# Patient Record
Sex: Male | Born: 1965 | Race: Black or African American | Hispanic: No | Marital: Married | Smoking: Current every day smoker
Health system: Southern US, Community
[De-identification: ages and names within clinical notes are randomized; demographics above are authoritative.]

## PROBLEM LIST (undated history)

## (undated) DIAGNOSIS — E43 Unspecified severe protein-calorie malnutrition: Secondary | ICD-10-CM

## (undated) DIAGNOSIS — F32A Depression, unspecified: Secondary | ICD-10-CM

## (undated) DIAGNOSIS — F1910 Other psychoactive substance abuse, uncomplicated: Secondary | ICD-10-CM

## (undated) DIAGNOSIS — E2740 Unspecified adrenocortical insufficiency: Secondary | ICD-10-CM

## (undated) DIAGNOSIS — A419 Sepsis, unspecified organism: Secondary | ICD-10-CM

## (undated) DIAGNOSIS — G001 Pneumococcal meningitis: Secondary | ICD-10-CM

## (undated) DIAGNOSIS — G002 Streptococcal meningitis: Secondary | ICD-10-CM

## (undated) DIAGNOSIS — I5042 Chronic combined systolic (congestive) and diastolic (congestive) heart failure: Secondary | ICD-10-CM

## (undated) DIAGNOSIS — I76 Septic arterial embolism: Secondary | ICD-10-CM

## (undated) DIAGNOSIS — I6330 Cerebral infarction due to thrombosis of unspecified cerebral artery: Secondary | ICD-10-CM

## (undated) DIAGNOSIS — I214 Non-ST elevation (NSTEMI) myocardial infarction: Secondary | ICD-10-CM

## (undated) DIAGNOSIS — G919 Hydrocephalus, unspecified: Secondary | ICD-10-CM

## (undated) DIAGNOSIS — F329 Major depressive disorder, single episode, unspecified: Secondary | ICD-10-CM

## (undated) DIAGNOSIS — I469 Cardiac arrest, cause unspecified: Secondary | ICD-10-CM

## (undated) DIAGNOSIS — A5903 Trichomonal cystitis and urethritis: Secondary | ICD-10-CM

## (undated) DIAGNOSIS — J9601 Acute respiratory failure with hypoxia: Secondary | ICD-10-CM

## (undated) DIAGNOSIS — R6520 Severe sepsis without septic shock: Secondary | ICD-10-CM

## (undated) DIAGNOSIS — R570 Cardiogenic shock: Secondary | ICD-10-CM

## (undated) DIAGNOSIS — E870 Hyperosmolality and hypernatremia: Secondary | ICD-10-CM

## (undated) DIAGNOSIS — R569 Unspecified convulsions: Secondary | ICD-10-CM

## (undated) DIAGNOSIS — J9690 Respiratory failure, unspecified, unspecified whether with hypoxia or hypercapnia: Secondary | ICD-10-CM

## (undated) DIAGNOSIS — R1310 Dysphagia, unspecified: Secondary | ICD-10-CM

## (undated) DIAGNOSIS — I639 Cerebral infarction, unspecified: Secondary | ICD-10-CM

## (undated) DIAGNOSIS — E785 Hyperlipidemia, unspecified: Secondary | ICD-10-CM

## (undated) DIAGNOSIS — G9340 Encephalopathy, unspecified: Secondary | ICD-10-CM

## (undated) DIAGNOSIS — R6521 Severe sepsis with septic shock: Secondary | ICD-10-CM

## (undated) DIAGNOSIS — F0390 Unspecified dementia without behavioral disturbance: Secondary | ICD-10-CM

## (undated) DIAGNOSIS — G0490 Encephalitis and encephalomyelitis, unspecified: Secondary | ICD-10-CM

## (undated) DIAGNOSIS — N179 Acute kidney failure, unspecified: Secondary | ICD-10-CM

## (undated) DIAGNOSIS — T68XXXA Hypothermia, initial encounter: Secondary | ICD-10-CM

## (undated) DIAGNOSIS — I509 Heart failure, unspecified: Secondary | ICD-10-CM

## (undated) DIAGNOSIS — I669 Occlusion and stenosis of unspecified cerebral artery: Secondary | ICD-10-CM

## (undated) DIAGNOSIS — R4182 Altered mental status, unspecified: Secondary | ICD-10-CM

## (undated) DIAGNOSIS — F1010 Alcohol abuse, uncomplicated: Secondary | ICD-10-CM

## (undated) DIAGNOSIS — R339 Retention of urine, unspecified: Secondary | ICD-10-CM

## (undated) DIAGNOSIS — Z720 Tobacco use: Secondary | ICD-10-CM

## (undated) HISTORY — DX: Acute respiratory failure with hypoxia: J96.01

## (undated) HISTORY — DX: Cardiogenic shock: R57.0

## (undated) HISTORY — DX: Streptococcal meningitis: G00.2

## (undated) HISTORY — DX: Hydrocephalus, unspecified: G91.9

## (undated) HISTORY — DX: Encephalitis and encephalomyelitis, unspecified: G04.90

## (undated) HISTORY — DX: Unspecified adrenocortical insufficiency: E27.40

## (undated) HISTORY — DX: Severe sepsis with septic shock: R65.21

## (undated) HISTORY — DX: Alcohol abuse, uncomplicated: F10.10

## (undated) HISTORY — DX: Respiratory failure, unspecified, unspecified whether with hypoxia or hypercapnia: J96.90

## (undated) HISTORY — DX: Dysphagia, unspecified: R13.10

## (undated) HISTORY — DX: Sepsis, unspecified organism: A41.9

## (undated) HISTORY — DX: Pneumococcal meningitis: G00.1

## (undated) HISTORY — DX: Other psychoactive substance abuse, uncomplicated: F19.10

## (undated) HISTORY — PX: ANKLE SURGERY: SHX546

## (undated) HISTORY — DX: Cerebral infarction due to thrombosis of unspecified cerebral artery: I63.30

## (undated) HISTORY — DX: Acute kidney failure, unspecified: N17.9

## (undated) HISTORY — DX: Retention of urine, unspecified: R33.9

## (undated) HISTORY — DX: Chronic combined systolic (congestive) and diastolic (congestive) heart failure: I50.42

## (undated) HISTORY — DX: Severe sepsis without septic shock: R65.20

## (undated) HISTORY — DX: Cerebral infarction, unspecified: I63.9

## (undated) HISTORY — DX: Unspecified severe protein-calorie malnutrition: E43

## (undated) HISTORY — DX: Unspecified convulsions: R56.9

## (undated) HISTORY — DX: Heart failure, unspecified: I50.9

## (undated) HISTORY — DX: Tobacco use: Z72.0

## (undated) HISTORY — DX: Hypothermia, initial encounter: T68.XXXA

## (undated) HISTORY — DX: Cardiac arrest, cause unspecified: I46.9

## (undated) HISTORY — DX: Unspecified dementia without behavioral disturbance: F03.90

## (undated) HISTORY — DX: Encephalopathy, unspecified: G93.40

## (undated) HISTORY — DX: Septic arterial embolism: I76

## (undated) HISTORY — DX: Hyperosmolality and hypernatremia: E87.0

## (undated) HISTORY — DX: Trichomonal cystitis and urethritis: A59.03

## (undated) HISTORY — DX: Hyperlipidemia, unspecified: E78.5

## (undated) HISTORY — DX: Altered mental status, unspecified: R41.82

## (undated) HISTORY — DX: Occlusion and stenosis of unspecified cerebral artery: I66.9

## (undated) HISTORY — DX: Non-ST elevation (NSTEMI) myocardial infarction: I21.4

---

## 1998-10-05 ENCOUNTER — Emergency Department (HOSPITAL_COMMUNITY): Admission: EM | Admit: 1998-10-05 | Discharge: 1998-10-05 | Payer: Self-pay | Admitting: Emergency Medicine

## 2002-09-18 ENCOUNTER — Emergency Department (HOSPITAL_COMMUNITY): Admission: EM | Admit: 2002-09-18 | Discharge: 2002-09-18 | Payer: Self-pay | Admitting: Emergency Medicine

## 2003-03-07 ENCOUNTER — Emergency Department (HOSPITAL_COMMUNITY): Admission: EM | Admit: 2003-03-07 | Discharge: 2003-03-07 | Payer: Self-pay

## 2003-04-29 ENCOUNTER — Encounter: Payer: Self-pay | Admitting: Emergency Medicine

## 2003-04-29 ENCOUNTER — Emergency Department (HOSPITAL_COMMUNITY): Admission: EM | Admit: 2003-04-29 | Discharge: 2003-04-29 | Payer: Self-pay | Admitting: Emergency Medicine

## 2005-02-25 ENCOUNTER — Emergency Department (HOSPITAL_COMMUNITY): Admission: EM | Admit: 2005-02-25 | Discharge: 2005-02-26 | Payer: Self-pay | Admitting: Emergency Medicine

## 2005-05-07 ENCOUNTER — Emergency Department (HOSPITAL_COMMUNITY): Admission: EM | Admit: 2005-05-07 | Discharge: 2005-05-07 | Payer: Self-pay | Admitting: *Deleted

## 2009-06-01 ENCOUNTER — Inpatient Hospital Stay (HOSPITAL_COMMUNITY): Admission: EM | Admit: 2009-06-01 | Discharge: 2009-06-04 | Payer: Self-pay | Admitting: Emergency Medicine
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
## 2011-03-14 LAB — CBC
MCHC: 34.2 g/dL (ref 30.0–36.0)
Platelets: 243 10*3/uL (ref 150–400)
RDW: 13.7 % (ref 11.5–15.5)

## 2011-03-14 LAB — LACTIC ACID, PLASMA: Lactic Acid, Venous: 3.1 mmol/L — ABNORMAL HIGH (ref 0.5–2.2)

## 2011-03-14 LAB — POCT I-STAT, CHEM 8
Calcium, Ion: 1.1 mmol/L — ABNORMAL LOW (ref 1.12–1.32)
Creatinine, Ser: 0.9 mg/dL (ref 0.4–1.5)
Glucose, Bld: 82 mg/dL (ref 70–99)
HCT: 41 % (ref 39.0–52.0)
Hemoglobin: 13.9 g/dL (ref 13.0–17.0)

## 2011-03-14 LAB — PROTIME-INR: Prothrombin Time: 12.7 seconds (ref 11.6–15.2)
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
## 2011-11-13 ENCOUNTER — Emergency Department (HOSPITAL_COMMUNITY): Payer: Self-pay

## 2011-11-13 ENCOUNTER — Emergency Department (HOSPITAL_COMMUNITY)
Admission: EM | Admit: 2011-11-13 | Discharge: 2011-11-13 | Disposition: A | Discharge status: Home / Self Care | Payer: Self-pay | Attending: Emergency Medicine | Admitting: Emergency Medicine

## 2011-11-13 ENCOUNTER — Encounter: Payer: Self-pay | Admitting: Emergency Medicine

## 2011-11-13 DIAGNOSIS — A5903 Trichomonal cystitis and urethritis: Secondary | ICD-10-CM | POA: Insufficient documentation

## 2011-11-13 DIAGNOSIS — R1032 Left lower quadrant pain: Secondary | ICD-10-CM | POA: Insufficient documentation

## 2011-11-13 DIAGNOSIS — F17200 Nicotine dependence, unspecified, uncomplicated: Secondary | ICD-10-CM | POA: Insufficient documentation

## 2011-11-13 DIAGNOSIS — M549 Dorsalgia, unspecified: Secondary | ICD-10-CM | POA: Insufficient documentation

## 2011-11-13 DIAGNOSIS — R509 Fever, unspecified: Secondary | ICD-10-CM | POA: Insufficient documentation

## 2011-11-13 DIAGNOSIS — R10814 Left lower quadrant abdominal tenderness: Secondary | ICD-10-CM | POA: Insufficient documentation

## 2011-11-13 DIAGNOSIS — R112 Nausea with vomiting, unspecified: Secondary | ICD-10-CM | POA: Insufficient documentation

## 2011-11-13 LAB — DIFFERENTIAL
Basophils Absolute: 0 10*3/uL (ref 0.0–0.1)
Basophils Relative: 0 % (ref 0–1)
Eosinophils Absolute: 0.1 10*3/uL (ref 0.0–0.7)
Monocytes Absolute: 0.4 10*3/uL (ref 0.1–1.0)
Neutro Abs: 4.5 10*3/uL (ref 1.7–7.7)
Neutrophils Relative %: 61 % (ref 43–77)

## 2011-11-13 LAB — URINALYSIS, ROUTINE W REFLEX MICROSCOPIC
Glucose, UA: NEGATIVE mg/dL
Ketones, ur: NEGATIVE mg/dL
Nitrite: NEGATIVE
Specific Gravity, Urine: 1.005 (ref 1.005–1.030)
pH: 6 (ref 5.0–8.0)

## 2011-11-13 LAB — COMPREHENSIVE METABOLIC PANEL
AST: 30 U/L (ref 0–37)
Albumin: 3.8 g/dL (ref 3.5–5.2)
BUN: 10 mg/dL (ref 6–23)
Chloride: 103 mEq/L (ref 96–112)
Creatinine, Ser: 0.67 mg/dL (ref 0.50–1.35)
Total Bilirubin: 0.6 mg/dL (ref 0.3–1.2)
Total Protein: 7.5 g/dL (ref 6.0–8.3)

## 2011-11-13 LAB — URINE MICROSCOPIC-ADD ON

## 2011-11-13 LAB — CBC
MCH: 31.6 pg (ref 26.0–34.0)
MCHC: 35.5 g/dL (ref 30.0–36.0)
RDW: 13.6 % (ref 11.5–15.5)

## 2011-11-13 LAB — LIPASE, BLOOD: Lipase: 24 U/L (ref 11–59)

## 2011-11-13 MED ORDER — MORPHINE SULFATE 4 MG/ML IJ SOLN
4.0000 mg | Freq: Once | INTRAMUSCULAR | Status: AC
  Administered 2011-11-13: 4 mg via INTRAVENOUS
  Filled 2011-11-13: qty 1

## 2011-11-13 MED ORDER — ONDANSETRON HCL 4 MG/2ML IJ SOLN
4.0000 mg | Freq: Once | INTRAMUSCULAR | Status: AC
  Administered 2011-11-13: 4 mg via INTRAVENOUS
  Filled 2011-11-13: qty 2

## 2011-11-13 MED ORDER — SODIUM CHLORIDE 0.9 % IV BOLUS (SEPSIS)
1000.0000 mL | Freq: Once | INTRAVENOUS | Status: AC
  Administered 2011-11-13: 1000 mL via INTRAVENOUS

## 2011-11-13 MED ORDER — METRONIDAZOLE 500 MG PO TABS: 500.0000 mg | ORAL_TABLET | Freq: Two times a day (BID) | ORAL | Status: AC

## 2011-11-13 MED ORDER — IOHEXOL 300 MG/ML  SOLN
100.0000 mL | Freq: Once | INTRAMUSCULAR | Status: AC | PRN
  Administered 2011-11-13: 100 mL via INTRAVENOUS

## 2011-11-13 NOTE — ED Notes (Signed)
C/o abd pain, nausea, and vomiting x 1 year.

## 2011-11-13 NOTE — ED Provider Notes (Signed)
History     CSN: [id number] Arrival date & time: 11/13/2011  6:10 PM   First MD Initiated Contact with Patient 11/13/11 1909      Chief Complaint  Patient presents with  . Abdominal Pain     HPI  45yo male previously healthy presents with abdominal pain. The patient complains of left lower quadrant abdominal pain intermittently over the past one year. He states the pain has been worse recently. He complains of multiple episodes of nausea and nonbilious nonbloody emesis. He denies pain radiating to his back but states that he does have chronic left lower back pain which he feels is unrelated. He denies hematuria, dysuria, frequency, urgency. He is sexually active. He denies fevers, chills. He did not take anything for pain prior to arrival. Denies other complaints today including chest pain, shortness of breath. He is not having constipation or diarrhea. No history of abdominal surgeries.   ED Notes, ED Provider Notes from 11/13/11 0000 to 11/13/11 18:18:39       [name], RN 11/13/2011 18:17      C/o abd pain, nausea, and vomiting x 1 year.     History reviewed. No pertinent past medical history.  History reviewed. No pertinent past surgical history.  No family history on file.  History  Substance Use Topics  . Smoking status: Current Everyday Smoker  . Smokeless tobacco: Not on file  . Alcohol Use: Yes      Review of Systems  All other systems reviewed and are negative.   except as noted HPI   Allergies  Review of patient's allergies indicates no known allergies.  Home Medications   Current Outpatient Rx  Name Route Sig Dispense Refill  . METRONIDAZOLE 500 MG PO TABS Oral Take 1 tablet (500 mg total) by mouth 2 (two) times daily. 14 tablet 0    BP 106/70  Pulse 66  Temp(Src) 97.7 F (36.5 C) (Oral)  Resp 14  SpO2 98%  Physical Exam  Nursing note and vitals reviewed. Constitutional: He is oriented to person, place, and time. He appears  well-developed and well-nourished. No distress.  HENT:  Head: Atraumatic.  Mouth/Throat: Oropharynx is clear and moist.  Eyes: Conjunctivae are normal. Pupils are equal, round, and reactive to light.  Neck: Neck supple.  Cardiovascular: Normal rate, regular rhythm, normal heart sounds and intact distal pulses.  Exam reveals no gallop and no friction rub.   No murmur heard. Pulmonary/Chest: Effort normal. No respiratory distress. He has no wheezes. He has no rales.  Abdominal: Soft. Bowel sounds are normal. There is no tenderness. There is no rebound and no guarding.       Diffuse abd ttp > LLQ  Musculoskeletal: Normal range of motion. He exhibits no edema and no tenderness.  Neurological: He is alert and oriented to person, place, and time.  Skin: Skin is warm and dry.  Psychiatric: He has a normal mood and affect.    ED Course  Procedures (including critical care time)  Labs Reviewed  CBC - Abnormal; Notable for the following:    HCT 38.6 (*)    All other components within normal limits  URINALYSIS, ROUTINE W REFLEX MICROSCOPIC - Abnormal; Notable for the following:    Color, Urine STRAW (*)    Leukocytes, UA SMALL (*)    All other components within normal limits  URINE MICROSCOPIC-ADD ON - Abnormal; Notable for the following:    Squamous Epithelial / LPF FEW (*)    All other  components within normal limits  DIFFERENTIAL  COMPREHENSIVE METABOLIC PANEL  LIPASE, BLOOD  GC/CHLAMYDIA PROBE AMP, URINE   Ct Abdomen Pelvis W Contrast  11/13/2011  *RADIOLOGY REPORT*  Clinical Data: Abdominal pain, fever, nausea, vomiting  CT ABDOMEN AND PELVIS WITH CONTRAST  Technique:  Multidetector CT imaging of the abdomen and pelvis was performed following the standard protocol during bolus administration of intravenous contrast.  Contrast: OMNIPAQUE IOHEXOL 300 MG/ML IV SOLN  Comparison: 06/01/2009  Findings: The lung bases are clear.  The liver, spleen, gallbladder, pancreas, adrenal glands,  kidneys, abdominal aorta, and retroperitoneal lymph nodes are unremarkable.  Mild atherosclerotic calcification in the aorta.  Contrast filled stomach without significant wall thickening.  The colon is decompressed.  Scattered stool.  Small bowel are decompressed.  No free air or free fluid in the abdomen.  Pelvis:  The bladder wall is not thickened.  Mild prominence of the prostate gland with prominent right seminal vesicle.  No free or loculated pelvic fluid collections.  No inflammatory changes suggested in the sigmoid colon.  The appendix is normal.  Normal alignment of the lumbar vertebrae.  No significant change since previous study.  Note that the tiny left lower lung nodules seen previously is not visible today.  IMPRESSION: No acute process demonstrated in the abdomen or pelvis.  Original Report Authenticated By: Marlon Pel, M.D.    1. Abdominal pain   2. Trichomonal urethritis in male     MDM  Nonspec abd pain. CT A/P unremarkable as above. +Trichomonas, add on Gc/Chl. Will treat with flagyl. Precautions for return.         Forbes Cellar, MD 11/13/11 [phone]

## 2011-11-15 LAB — GC/CHLAMYDIA PROBE AMP, URINE: Chlamydia, Swab/Urine, PCR: NEGATIVE

## 2011-12-02 ENCOUNTER — Encounter (HOSPITAL_COMMUNITY): Payer: Self-pay | Admitting: Emergency Medicine

## 2011-12-02 ENCOUNTER — Emergency Department (HOSPITAL_COMMUNITY)
Admission: EM | Admit: 2011-12-02 | Discharge: 2011-12-02 | Discharge status: Against Medical Advice | Payer: Self-pay | Attending: Emergency Medicine | Admitting: Emergency Medicine

## 2011-12-02 DIAGNOSIS — F1010 Alcohol abuse, uncomplicated: Secondary | ICD-10-CM | POA: Insufficient documentation

## 2011-12-02 DIAGNOSIS — R109 Unspecified abdominal pain: Secondary | ICD-10-CM | POA: Insufficient documentation

## 2011-12-02 NOTE — ED Notes (Signed)
Pt alert, presents via PTAR, c/o abd pain, hx pancreatitis, + ETOH, resp even unlabored, skin pwd

## 2011-12-02 NOTE — ED Notes (Signed)
Pt alert, GCS 15, pt decides to leave p/t eval by MD, pt refuses to sign AMA paperwork, pt leaves ER
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
## 2011-12-02 NOTE — ED Notes (Signed)
Pt remains on unit, wandering into hallway, requesting food, educated pt that no PO nourishments until eval by MD

## 2013-04-02 IMAGING — CT CT ABD-PELV W/ CM
3 of 5 series · 14 of 32 positions shown, 19 images · IV contrast (water/omni  & 100ml omni 300)
Comparison: 06/01/2009

CLINICAL DATA: Abdominal pain, fever, nausea, vomiting

CT ABDOMEN AND PELVIS WITH CONTRAST
TECHNIQUE: Multidetector CT imaging of the abdomen and pelvis was
performed following the standard protocol during bolus
administration of intravenous contrast.
Contrast: 100mL OMNIPAQUE IOHEXOL 300 MG/ML IV SOLN

[Series 2: routine abdomen · axial · 0.67mm/px · z∈[-379,-139]mm · 4 of 81 slices shown, 9 images]
[im 17/81  soft-tissue]
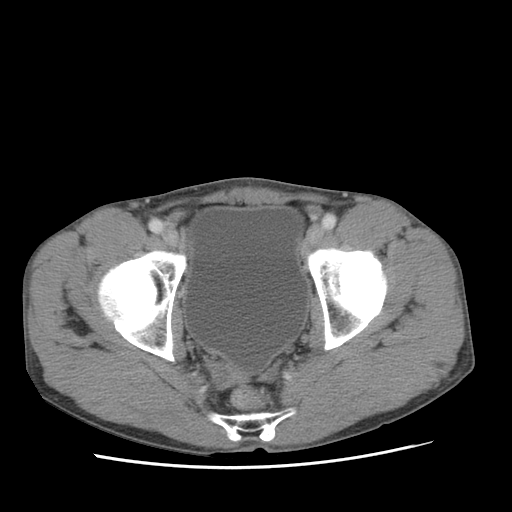
[im 17/81  lung]
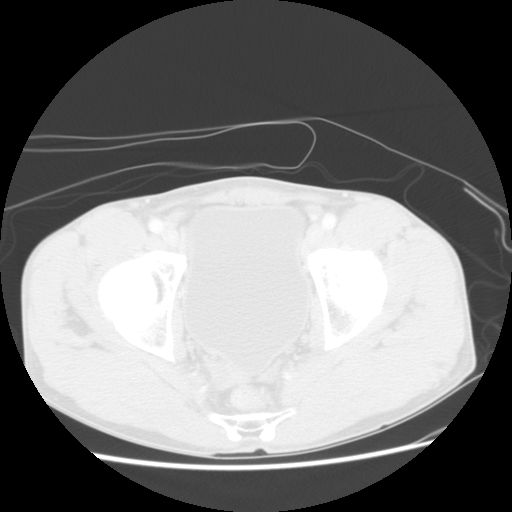
[im 17/81  bone]
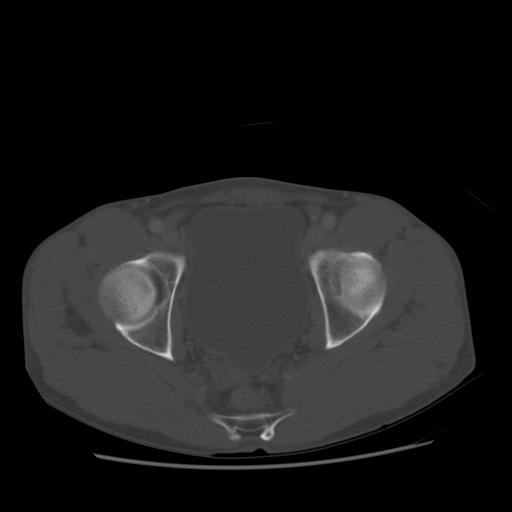
[im 33/81  soft-tissue]
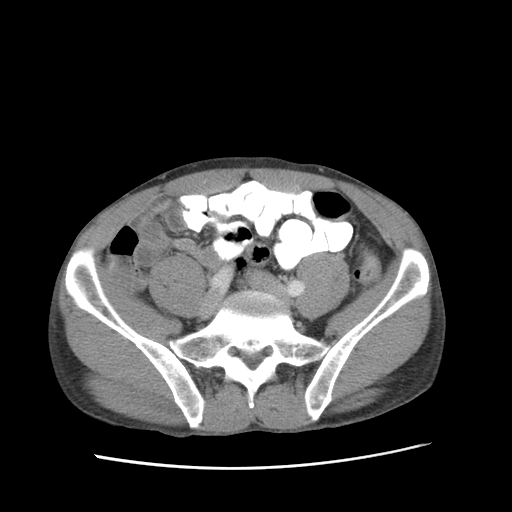
[im 33/81  lung]
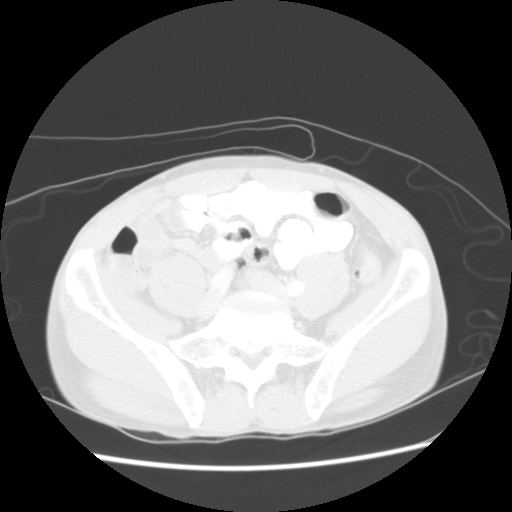
[im 49/81  soft-tissue]
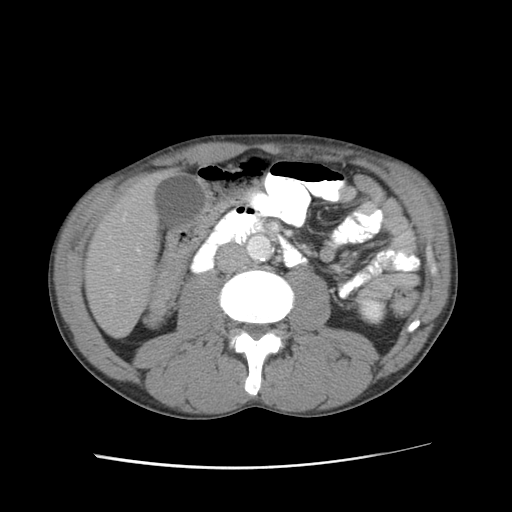
[im 49/81  lung]
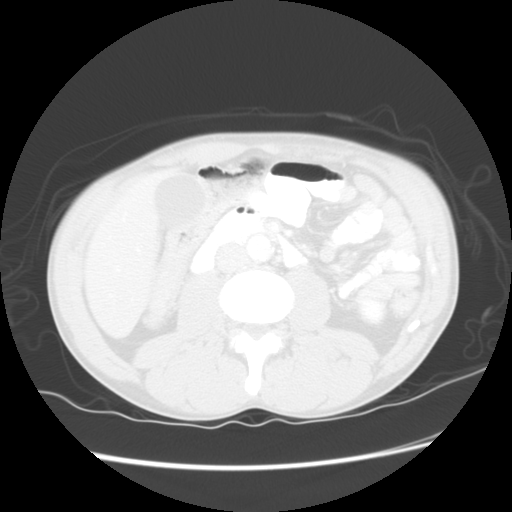
[im 65/81  soft-tissue]
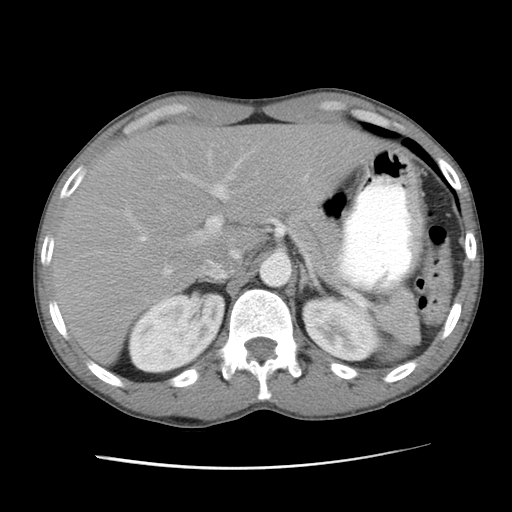
[im 65/81  lung]
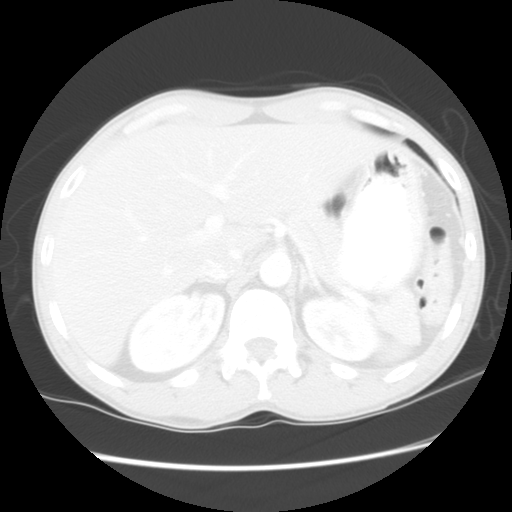

[Series 400: cor · coronal · 0.81mm/px · 2 of 107 slices shown]
[im 14/107  soft-tissue]
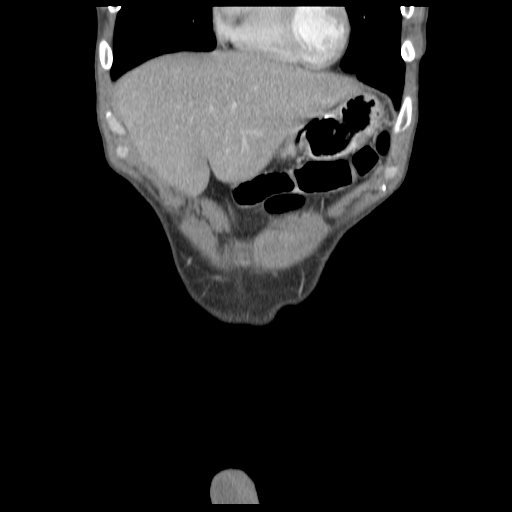
[im 27/107  soft-tissue]
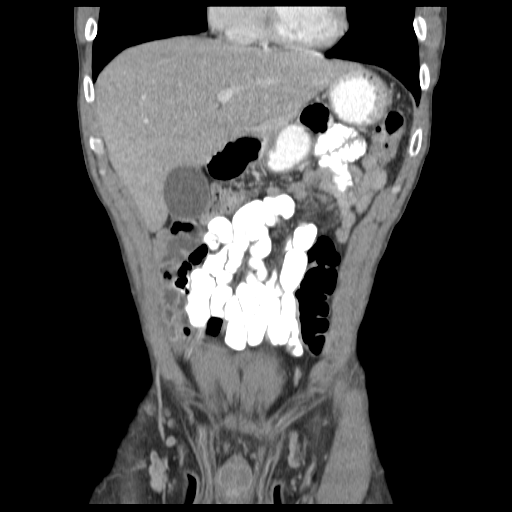

[Series 401: sag · sagittal · 0.81mm/px · 8 of 142 slices shown]
[im 13/142  soft-tissue]
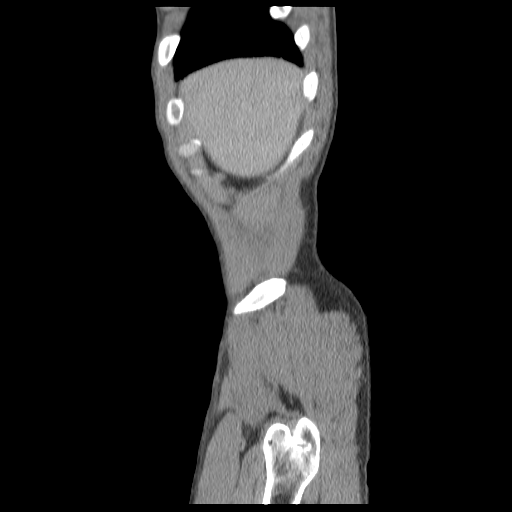
[im 26/142  soft-tissue]
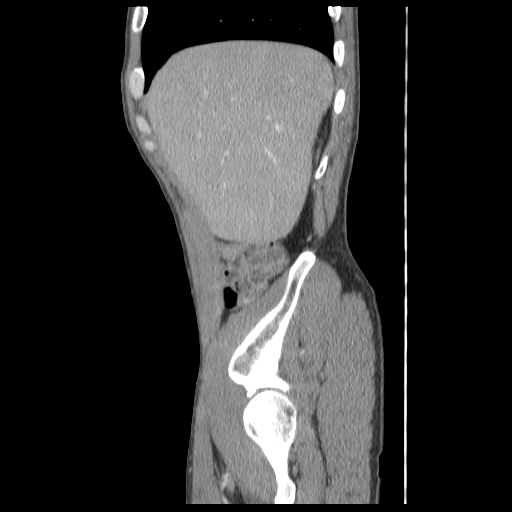
[im 52/142  soft-tissue]
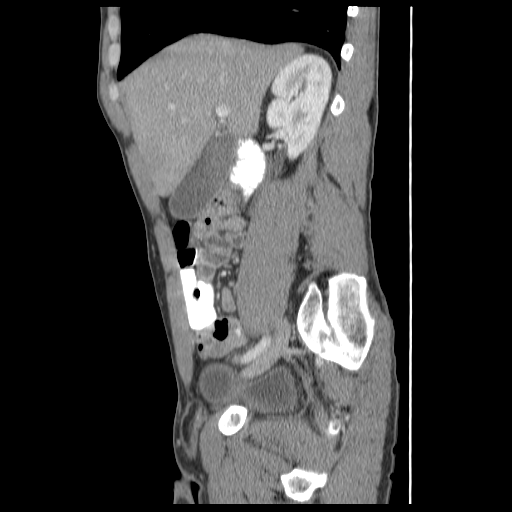
[im 65/142  soft-tissue]
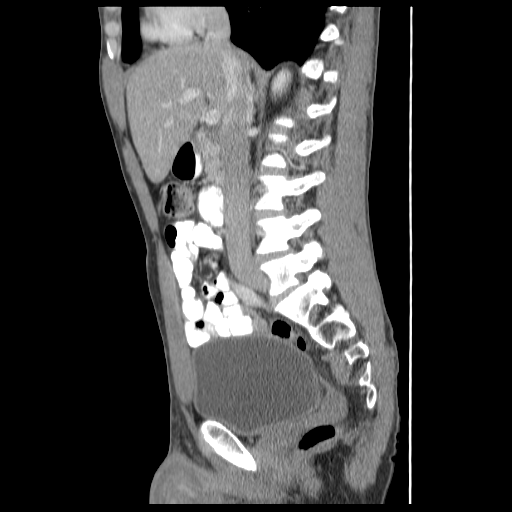
[im 77/142  soft-tissue]
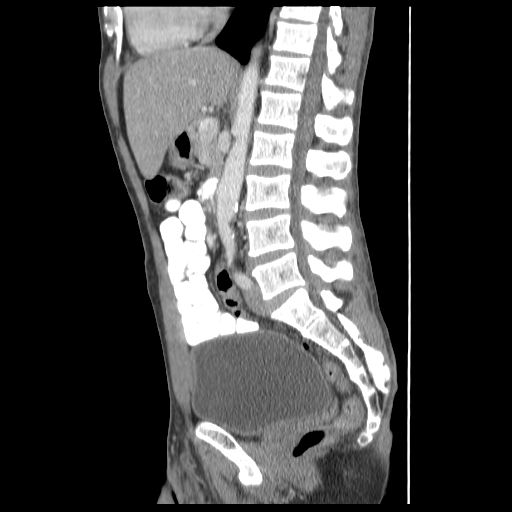
[im 90/142  soft-tissue]
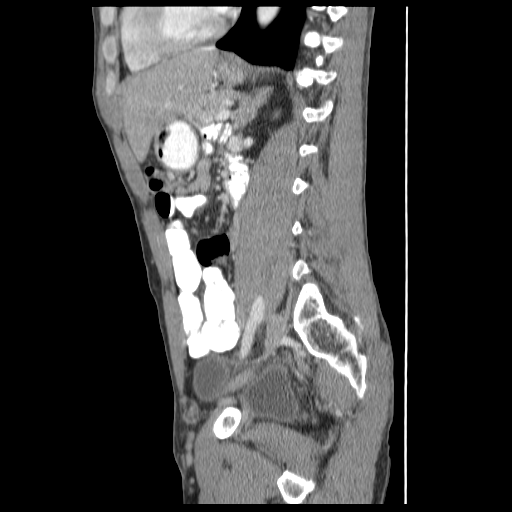
[im 116/142  soft-tissue]
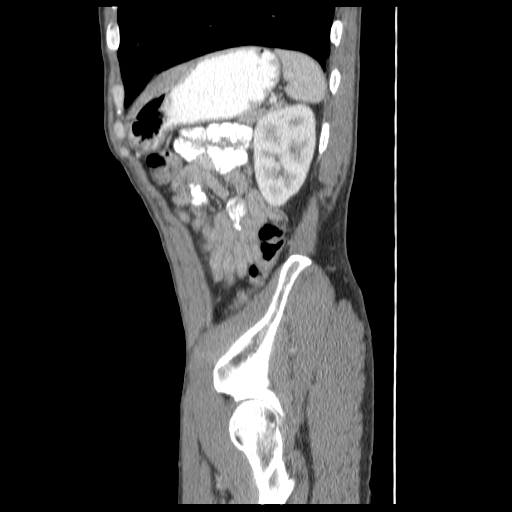
[im 129/142  soft-tissue]
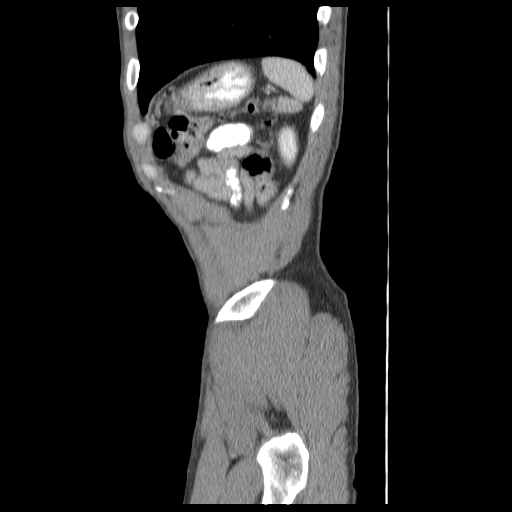

[14 of 32 positions shown; findings below may reference images not displayed]

FINDINGS: The lung bases are clear.  The liver, spleen,
gallbladder, pancreas, adrenal glands, kidneys, abdominal aorta,
and retroperitoneal lymph nodes are unremarkable.  Mild
atherosclerotic calcification in the aorta.  Contrast filled
stomach without significant wall thickening.  The colon is
decompressed.  Scattered stool.  Small bowel are decompressed.  No
free air or free fluid in the abdomen.

Pelvis:  The bladder wall is not thickened.  Mild prominence of the
prostate gland with prominent right seminal vesicle.  No free or
loculated pelvic fluid collections.  No inflammatory changes
suggested in the sigmoid colon.  The appendix is normal.  Normal
alignment of the lumbar vertebrae.

No significant change since previous study.  Note that the tiny
left lower lung nodules seen previously is not visible today.
IMPRESSION: No acute process demonstrated in the abdomen or pelvis.

## 2014-08-25 ENCOUNTER — Emergency Department (HOSPITAL_COMMUNITY): Payer: No Typology Code available for payment source

## 2014-08-25 ENCOUNTER — Encounter (HOSPITAL_COMMUNITY): Payer: Self-pay | Admitting: Emergency Medicine

## 2014-08-25 ENCOUNTER — Emergency Department (HOSPITAL_COMMUNITY)
Admission: EM | Admit: 2014-08-25 | Discharge: 2014-08-25 | Disposition: A | Discharge status: Home / Self Care | Payer: No Typology Code available for payment source | Attending: Emergency Medicine | Admitting: Emergency Medicine

## 2014-08-25 DIAGNOSIS — F17200 Nicotine dependence, unspecified, uncomplicated: Secondary | ICD-10-CM | POA: Insufficient documentation

## 2014-08-25 DIAGNOSIS — Y9389 Activity, other specified: Secondary | ICD-10-CM | POA: Insufficient documentation

## 2014-08-25 DIAGNOSIS — Y92410 Unspecified street and highway as the place of occurrence of the external cause: Secondary | ICD-10-CM | POA: Insufficient documentation

## 2014-08-25 DIAGNOSIS — S39012A Strain of muscle, fascia and tendon of lower back, initial encounter: Secondary | ICD-10-CM

## 2014-08-25 DIAGNOSIS — IMO0002 Reserved for concepts with insufficient information to code with codable children: Secondary | ICD-10-CM | POA: Insufficient documentation

## 2014-08-25 HISTORY — DX: Depression, unspecified: F32.A

## 2014-08-25 HISTORY — DX: Major depressive disorder, single episode, unspecified: F32.9

## 2014-08-25 MED ORDER — NAPROXEN 500 MG PO TABS: 500.0000 mg | ORAL_TABLET | Freq: Two times a day (BID) | ORAL | Status: DC

## 2014-08-25 MED ORDER — TRAMADOL HCL 50 MG PO TABS: 50.0000 mg | ORAL_TABLET | Freq: Four times a day (QID) | ORAL | Status: DC | PRN

## 2014-08-25 MED ORDER — METHOCARBAMOL 500 MG PO TABS: 500.0000 mg | ORAL_TABLET | Freq: Two times a day (BID) | ORAL | Status: DC
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
## 2014-08-25 NOTE — Discharge Instructions (Signed)
Naprosyn for pain and inflammation. Robaxin for muscle spasms. Ultram for severe pain. Follow up with primary care doctor for recheck. Your x-ray was normal today.   Lumbosacral Strain Lumbosacral strain is a strain of any of the parts that make up your lumbosacral vertebrae. Your lumbosacral vertebrae are the bones that make up the lower third of your backbone. Your lumbosacral vertebrae are held together by muscles and tough, fibrous tissue (ligaments).  CAUSES  A sudden blow to your back can cause lumbosacral strain. Also, anything that causes an excessive stretch of the muscles in the low back can cause this strain. This is typically seen when people exert themselves strenuously, fall, lift heavy objects, bend, or crouch repeatedly. RISK FACTORS  Physically demanding work.  Participation in pushing or pulling sports or sports that require a sudden twist of the back (tennis, golf, baseball).  Weight lifting.  Excessive lower back curvature.  Forward-tilted pelvis.  Weak back or abdominal muscles or both.  Tight hamstrings. SIGNS AND SYMPTOMS  Lumbosacral strain may cause pain in the area of your injury or pain that moves (radiates) down your leg.  DIAGNOSIS Your health care provider can often diagnose lumbosacral strain through a physical exam. In some cases, you may need tests such as X-ray exams.  TREATMENT  Treatment for your lower back injury depends on many factors that your clinician will have to evaluate. However, most treatment will include the use of anti-inflammatory medicines. HOME CARE INSTRUCTIONS   Avoid hard physical activities (tennis, racquetball, waterskiing) if you are not in proper physical condition for it. This may aggravate or create problems.  If you have a back problem, avoid sports requiring sudden body movements. Swimming and walking are generally safer activities.  Maintain good posture.  Maintain a healthy weight.  For acute conditions, you may put  ice on the injured area.  Put ice in a plastic bag.  Place a towel between your skin and the bag.  Leave the ice on for 20 minutes, 2-3 times a day.  When the low back starts healing, stretching and strengthening exercises may be recommended. SEEK MEDICAL CARE IF:  Your back pain is getting worse.  You experience severe back pain not relieved with medicines. SEEK IMMEDIATE MEDICAL CARE IF:   You have numbness, tingling, weakness, or problems with the use of your arms or legs.  There is a change in bowel or bladder control.  You have increasing pain in any area of the body, including your belly (abdomen).  You notice shortness of breath, dizziness, or feel faint.  You feel sick to your stomach (nauseous), are throwing up (vomiting), or become sweaty.  You notice discoloration of your toes or legs, or your feet get very cold. MAKE SURE YOU:   Understand these instructions.  Will watch your condition.  Will get help right away if you are not doing well or get worse. Document Released: 08/31/2005 Document Revised: 11/26/2013 Document Reviewed: 07/10/2013 [facility] Patient Information 2015 [place], [place]. This information is not intended to replace advice given to you by your health care provider. Make sure you discuss any questions you have with your health care provider.

## 2014-08-27 NOTE — ED Provider Notes (Signed)
Medical screening examination/treatment/procedure(s) were performed by non-physician practitioner and as supervising physician I was immediately available for consultation/collaboration.   EKG Interpretation None        [name] [name], MD 08/27/14 1531 

## 2015-07-30 ENCOUNTER — Encounter (HOSPITAL_COMMUNITY): Payer: Self-pay | Admitting: Emergency Medicine

## 2015-07-30 ENCOUNTER — Emergency Department (HOSPITAL_COMMUNITY)
Admission: EM | Admit: 2015-07-30 | Discharge: 2015-07-30 | Disposition: A | Discharge status: Home / Self Care | Payer: Self-pay | Attending: Emergency Medicine | Admitting: Emergency Medicine

## 2015-07-30 DIAGNOSIS — L02414 Cutaneous abscess of left upper limb: Secondary | ICD-10-CM | POA: Insufficient documentation

## 2015-07-30 DIAGNOSIS — L0390 Cellulitis, unspecified: Secondary | ICD-10-CM

## 2015-07-30 DIAGNOSIS — L0291 Cutaneous abscess, unspecified: Secondary | ICD-10-CM

## 2015-07-30 DIAGNOSIS — Z8659 Personal history of other mental and behavioral disorders: Secondary | ICD-10-CM | POA: Insufficient documentation

## 2015-07-30 DIAGNOSIS — L03114 Cellulitis of left upper limb: Secondary | ICD-10-CM | POA: Insufficient documentation

## 2015-07-30 DIAGNOSIS — Z720 Tobacco use: Secondary | ICD-10-CM | POA: Insufficient documentation

## 2015-07-30 LAB — CBC WITH DIFFERENTIAL/PLATELET
BASOS ABS: 0 10*3/uL (ref 0.0–0.1)
Basophils Relative: 0 % (ref 0–1)
Eosinophils Absolute: 0 10*3/uL (ref 0.0–0.7)
Eosinophils Relative: 0 % (ref 0–5)
HEMATOCRIT: 36.8 % — AB (ref 39.0–52.0)
Hemoglobin: 13 g/dL (ref 13.0–17.0)
LYMPHS ABS: 2.3 10*3/uL (ref 0.7–4.0)
LYMPHS PCT: 23 % (ref 12–46)
MCH: 31.7 pg (ref 26.0–34.0)
MCHC: 35.3 g/dL (ref 30.0–36.0)
MCV: 89.8 fL (ref 78.0–100.0)
MONO ABS: 1 10*3/uL (ref 0.1–1.0)
MONOS PCT: 10 % (ref 3–12)
NEUTROS ABS: 6.8 10*3/uL (ref 1.7–7.7)
Neutrophils Relative %: 67 % (ref 43–77)
Platelets: 201 10*3/uL (ref 150–400)
RBC: 4.1 MIL/uL — ABNORMAL LOW (ref 4.22–5.81)
RDW: 12.6 % (ref 11.5–15.5)
WBC: 10.1 10*3/uL (ref 4.0–10.5)

## 2015-07-30 LAB — COMPREHENSIVE METABOLIC PANEL
ALT: 20 U/L (ref 17–63)
AST: 25 U/L (ref 15–41)
Albumin: 3.6 g/dL (ref 3.5–5.0)
Alkaline Phosphatase: 57 U/L (ref 38–126)
Anion gap: 11 (ref 5–15)
BUN: 12 mg/dL (ref 6–20)
CO2: 23 mmol/L (ref 22–32)
CREATININE: 0.75 mg/dL (ref 0.61–1.24)
Calcium: 8.9 mg/dL (ref 8.9–10.3)
Chloride: 98 mmol/L — ABNORMAL LOW (ref 101–111)
GFR calc Af Amer: >60 mL/min (ref >60)
Glucose, Bld: 67 mg/dL (ref 65–99)
Potassium: 3.4 mmol/L — ABNORMAL LOW (ref 3.5–5.1)
Sodium: 132 mmol/L — ABNORMAL LOW (ref 135–145)
Total Bilirubin: 0.5 mg/dL (ref 0.3–1.2)
Total Protein: 7.5 g/dL (ref 6.5–8.1)

## 2015-07-30 MED ORDER — SULFAMETHOXAZOLE-TRIMETHOPRIM 800-160 MG PO TABS
1.0000 | ORAL_TABLET | Freq: Once | ORAL | Status: AC
  Administered 2015-07-30: 1 via ORAL
  Filled 2015-07-30: qty 1

## 2015-07-30 MED ORDER — SULFAMETHOXAZOLE-TRIMETHOPRIM 800-160 MG PO TABS: 1.0000 | ORAL_TABLET | Freq: Two times a day (BID) | ORAL | Status: AC

## 2015-07-30 MED ORDER — LIDOCAINE-EPINEPHRINE 1 %-1:100000 IJ SOLN
20.0000 mL | Freq: Once | INTRAMUSCULAR | Status: AC
  Administered 2015-07-30: 20 mL via INTRADERMAL
  Filled 2015-07-30: qty 1

## 2015-07-30 NOTE — ED Notes (Signed)
Pt. Left with all belongings and refused wheelchair. Discharge instructions were reviewed and all questions were answered.  

## 2015-07-30 NOTE — Discharge Instructions (Signed)
Abscess °Care After °An abscess (also called a boil or furuncle) is an infected area that contains a collection of pus. Signs and symptoms of an abscess include pain, tenderness, redness, or hardness, or you may feel a moveable soft area under your skin. An abscess can occur anywhere in the body. The infection may spread to surrounding tissues causing cellulitis. A cut (incision) by the surgeon was made over your abscess and the pus was drained out. Gauze may have been packed into the space to provide a drain that will allow the cavity to heal from the inside outwards. The boil may be painful for 5 to 7 days. Most people with a boil do not have high fevers. Your abscess, if seen early, may not have localized, and may not have been lanced. If not, another appointment may be required for this if it does not get better on its own or with medications. °HOME CARE INSTRUCTIONS  °· Only take over-the-counter or prescription medicines for pain, discomfort, or fever as directed by your caregiver. °· When you bathe, soak and then remove gauze or iodoform packs at least daily or as directed by your caregiver. You may then wash the wound gently with mild soapy water. Repack with gauze or do as your caregiver directs. °SEEK IMMEDIATE MEDICAL CARE IF:  °· You develop increased pain, swelling, redness, drainage, or bleeding in the wound site. °· You develop signs of generalized infection including muscle aches, chills, fever, or a general ill feeling. °· An oral temperature above 102° F (38.9° C) develops, not controlled by medication. °See your caregiver for a recheck if you develop any of the symptoms described above. If medications (antibiotics) were prescribed, take them as directed. °Document Released: 06/09/2005 Document Revised: 02/13/2012 Document Reviewed: 02/04/2008 °[employer]® Patient Information ©2015 [employer]. This information is not intended to replace advice given to you by your health care provider. Make sure  you discuss any questions you have with your health care provider. ° °Cellulitis °Cellulitis is an infection of the skin and the tissue under the skin. The infected area is usually red and tender. This happens most often in the arms and lower legs. °HOME CARE  °· Take your antibiotic medicine as told. Finish the medicine even if you start to feel better. °· Keep the infected arm or leg raised (elevated). °· Put a warm cloth on the area up to 4 times per day. °· Only take medicines as told by your doctor. °· Keep all doctor visits as told. °GET HELP IF: °· You see red streaks on the skin coming from the infected area. °· Your red area gets bigger or turns a dark color. °· Your bone or joint under the infected area is painful after the skin heals. °· Your infection comes back in the same area or different area. °· You have a puffy (swollen) bump in the infected area. °· You have new symptoms. °· You have a fever. °GET HELP RIGHT AWAY IF:  °· You feel very sleepy. °· You throw up (vomit) or have watery poop (diarrhea). °· You feel sick and have muscle aches and pains. °MAKE SURE YOU:  °· Understand these instructions. °· Will watch your condition. °· Will get help right away if you are not doing well or get worse. °Document Released: 05/09/2008 Document Revised: 04/07/2014 Document Reviewed: 02/06/2012 °[employer]® Patient Information ©2015 [employer]. This information is not intended to replace advice given to you by your health care provider. Make sure you discuss   any questions you have with your health care provider. ° °

## 2015-07-30 NOTE — ED Notes (Signed)
Pt. Was wheeled back to room and left room to go back to the waiting room for his phone.

## 2015-07-30 NOTE — ED Provider Notes (Signed)
History   Chief Complaint  Patient presents with  . Abscess    HPI 49 year old male past medical history as below presents ED for evaluation of abscess to left forearm which is painful. Rated moderate to severe.. Patient reports this is been present for the last 2 days and began after he was climbing a tree and either was bit by something or hit it on the tree. Patient states his daughter to squeeze it yesterday and got pus out of it. Patient denies fevers, nausea, vomiting. Patient reports having swelling and pain from forearm up to his elbow. Denies having difficulty ranging his elbow however. No history of abscesses in the past. Denies IV drug use. Patient is not a diabetic. No other complaints.  Past medical/surgical history, social history, medications, allergies and FH have been reviewed with patient and/or in documentation. Furthermore, if pt family or friend(s) present, additional historical information was obtained from them.  Past Medical History  Diagnosis Date  . Depression    Past Surgical History  Procedure Laterality Date  . Ankle surgery     No family history on file. Social History  Substance Use Topics  . Smoking status: Current Every Day Smoker -- 0.10 packs/day    Types: Cigarettes  . Smokeless tobacco: None  . Alcohol Use: Yes     Review of Systems Constitutional: - F/C, -fatigue.  HENT: - congestion, -rhinorrhea, -sore throat.   Eyes: - eye pain, -visual disturbance.  Respiratory: - cough, -SOB, -hemoptysis.   Cardiovascular: - CP, -palps.  Gastrointestinal: - N/V/D, -abd pain  Genitourinary: - flank pain, -dysuria, -frequency.  Musculoskeletal: - myalgia/arthritis, -joint swelling, -gait abnormality, -back pain, -neck pain/stiffness, +arm pain/swelling.  Skin: + rash/lesion.  Neurological: - focal weakness, -lightheadedness, -dizziness, -numbness, -HA.  All other systems reviewed and are negative.   Physical Exam  Physical Exam  ED Triage Vitals   Enc Vitals Group     BP 07/30/15 1908 108/63 mmHg     Pulse Rate 07/30/15 1908 101     Resp 07/30/15 1908 16     Temp 07/30/15 1908 98.4 F (36.9 C)     Temp Source 07/30/15 1908 Oral     SpO2 07/30/15 1908 99 %     Weight --      Height --      Head Cir --      Peak Flow --      Pain Score 07/30/15 1910 7     Pain Loc --      Pain Edu? --      Excl. in GC? --    Filed Vitals:   07/30/15 1908 07/30/15 2056  BP: 108/63 118/87  Pulse: 101 91  Temp: 98.4 F (36.9 C)   TempSrc: Oral   Resp: 16 18  SpO2: 99% 100%    Constitutional: Patient is well appearing and in no acute distress Head: Normocephalic and atraumatic.  Eyes: Extraocular motion intact, no scleral icterus Mouth: MMM, OP clear Neck: Supple without meningismus, mass, or overt JVD Respiratory: No respiratory distress. Normal WOB. No w/r/g. CV: RRR, no obvious murmurs.  Pulses +2 and symmetric. Euvolemic Abdomen: Soft, NT, ND, no r/g. No mass.  MSK: Extremities are atraumatic without deformity, ROM intact Skin: Doral L forearm - 1 cm open wound with purulent draining and surrounding warmth/erythema/edema/induration/TTP to forearm. No crepitus.  Neuro: AAOx4, MAE 5/5 sym, no focal deficit noted   ED Course  INCISION AND DRAINAGE Date/Time: 07/30/2015 9:25 PM Performed by: Ames Dura  Authorized by: Ames Dura Consent: Verbal consent obtained. Risks and benefits: risks, benefits and alternatives were discussed Consent given by: patient Type: abscess Body area: upper extremity Anesthesia: local infiltration Local anesthetic: lidocaine 1% with epinephrine Anesthetic total: 3 ml Patient sedated: no Scalpel size: 11 Incision type: single straight Incision depth: dermal Complexity: simple Drainage: purulent and  serosanguinous Drainage amount: scant Wound treatment: wound left open Patient tolerance: Patient tolerated the procedure well with no immediate complications     Labs Reviewed  CBC  WITH DIFFERENTIAL/PLATELET - Abnormal; Notable for the following:    RBC 4.10 (*)    HCT 36.8 (*)    All other components within normal limits  COMPREHENSIVE METABOLIC PANEL - Abnormal; Notable for the following:    Sodium 132 (*)    Potassium 3.4 (*)    Chloride 98 (*)    All other components within normal limits   I personally reviewed and interpreted all labs.  No results found. I personally viewed above image(s) which were used in my medical decision making. Formal interpretations by Radiology.  MDM: BERNHARD KOSKINEN is a 49 y.o. male with H&P as above who p/w CC: abscess/cellulitis HDS, NAD. Not septic appearing.   Patient's wound is consistent with abscess with fairly impressive surrounding cellulitis to forearm (doesnt cross joint). Patient is not febrile in ED and is without leukocytosis or systemic symptoms. I&D performed as above. Patient was given first dose of Bactrim in ED. Patient stable for discharge with 24 hour wound recheck.  Old records reviewed (if available). Labs and imaging reviewed personally by myself and considered in medical decision making if ordered.  Clinical Impression: 1. Abscess and cellulitis     Disposition: Discharge  Condition: Good  I have discussed the results, Dx and Tx plan with the pt(& family if present). He/she/they expressed understanding and agree(s) with the plan. Discharge instructions discussed at great length. Strict return precautions discussed and pt &/or family have verbalized understanding of the instructions. No further questions at time of discharge.    Discharge Medication List as of 07/30/2015  9:43 PM    START taking these medications   Details  sulfamethoxazole-trimethoprim (BACTRIM DS,SEPTRA DS) 800-160 MG per tablet Take 1 tablet by mouth 2 (two) times daily., Starting 07/30/2015, Until Thu 08/06/15, Print        Follow Up:    For wound re-check  [facility] EMERGENCY DEPARTMENT [address] [id number] mc [place] [address] [phone]    [facility] EMERGENCY DEPARTMENT [address] [id number] Wilhemina Bonito [place] [address] [phone]  If symptoms worsen   Pt seen in conjunction with Dr. Emeterio Reeve, DO [facility] Emergency Medicine Resident - PGY-3      Ames Dura, MD 07/31/15 0151  Cathren Laine, MD 08/02/15 1539

## 2015-07-30 NOTE — ED Notes (Signed)
Pt. presents with abscess at left forearm with drainage onset yesterday , denies fever or chills.

## 2016-02-13 ENCOUNTER — Encounter (HOSPITAL_COMMUNITY): Payer: Self-pay | Admitting: Emergency Medicine

## 2016-02-13 ENCOUNTER — Emergency Department (HOSPITAL_COMMUNITY): Payer: Medicaid Other

## 2016-02-13 ENCOUNTER — Emergency Department (HOSPITAL_COMMUNITY)
Admission: EM | Admit: 2016-02-13 | Discharge: 2016-02-13 | Disposition: A | Discharge status: Home / Self Care | Payer: Medicaid Other | Attending: Emergency Medicine | Admitting: Emergency Medicine

## 2016-02-13 DIAGNOSIS — J069 Acute upper respiratory infection, unspecified: Secondary | ICD-10-CM | POA: Insufficient documentation

## 2016-02-13 DIAGNOSIS — R2231 Localized swelling, mass and lump, right upper limb: Secondary | ICD-10-CM | POA: Diagnosis not present

## 2016-02-13 DIAGNOSIS — Z8659 Personal history of other mental and behavioral disorders: Secondary | ICD-10-CM | POA: Diagnosis not present

## 2016-02-13 DIAGNOSIS — R2232 Localized swelling, mass and lump, left upper limb: Secondary | ICD-10-CM | POA: Diagnosis not present

## 2016-02-13 DIAGNOSIS — R203 Hyperesthesia: Secondary | ICD-10-CM | POA: Insufficient documentation

## 2016-02-13 DIAGNOSIS — A5903 Trichomonal cystitis and urethritis: Secondary | ICD-10-CM | POA: Insufficient documentation

## 2016-02-13 DIAGNOSIS — R079 Chest pain, unspecified: Secondary | ICD-10-CM | POA: Diagnosis present

## 2016-02-13 DIAGNOSIS — F17210 Nicotine dependence, cigarettes, uncomplicated: Secondary | ICD-10-CM | POA: Insufficient documentation

## 2016-02-13 LAB — BASIC METABOLIC PANEL
Anion gap: 13 (ref 5–15)
BUN: 27 mg/dL — AB (ref 6–20)
CALCIUM: 7.7 mg/dL — AB (ref 8.9–10.3)
CO2: 18 mmol/L — ABNORMAL LOW (ref 22–32)
Chloride: 96 mmol/L — ABNORMAL LOW (ref 101–111)
Creatinine, Ser: 1.84 mg/dL — ABNORMAL HIGH (ref 0.61–1.24)
GFR calc Af Amer: 47 mL/min — ABNORMAL LOW (ref >60)
GFR calc non Af Amer: 41 mL/min — ABNORMAL LOW (ref >60)
GLUCOSE: 100 mg/dL — AB (ref 65–99)
POTASSIUM: 3.7 mmol/L (ref 3.5–5.1)
Sodium: 127 mmol/L — ABNORMAL LOW (ref 135–145)

## 2016-02-13 LAB — CBC
HCT: 34.9 % — ABNORMAL LOW (ref 39.0–52.0)
Hemoglobin: 12.6 g/dL — ABNORMAL LOW (ref 13.0–17.0)
MCH: 31.4 pg (ref 26.0–34.0)
MCHC: 36.1 g/dL — AB (ref 30.0–36.0)
MCV: 87 fL (ref 78.0–100.0)
Platelets: 147 10*3/uL — ABNORMAL LOW (ref 150–400)
RBC: 4.01 MIL/uL — ABNORMAL LOW (ref 4.22–5.81)
RDW: 12.8 % (ref 11.5–15.5)
WBC: 13.3 10*3/uL — ABNORMAL HIGH (ref 4.0–10.5)

## 2016-02-13 LAB — URINALYSIS, ROUTINE W REFLEX MICROSCOPIC
Bilirubin Urine: NEGATIVE
GLUCOSE, UA: NEGATIVE mg/dL
HGB URINE DIPSTICK: NEGATIVE
Ketones, ur: NEGATIVE mg/dL
Nitrite: NEGATIVE
Protein, ur: NEGATIVE mg/dL
Specific Gravity, Urine: 1.014 (ref 1.005–1.030)
pH: 5.5 (ref 5.0–8.0)

## 2016-02-13 LAB — TROPONIN I

## 2016-02-13 LAB — LACTIC ACID, PLASMA: Lactic Acid, Venous: 1.1 mmol/L (ref 0.5–2.0)

## 2016-02-13 LAB — URINE MICROSCOPIC-ADD ON

## 2016-02-13 MED ORDER — OXYCODONE-ACETAMINOPHEN 5-325 MG PO TABS
1.0000 | ORAL_TABLET | Freq: Once | ORAL | Status: AC
  Administered 2016-02-13: 1 via ORAL
  Filled 2016-02-13: qty 1

## 2016-02-13 MED ORDER — IBUPROFEN 800 MG PO TABS
800.0000 mg | ORAL_TABLET | Freq: Once | ORAL | Status: AC
  Administered 2016-02-13: 800 mg via ORAL
  Filled 2016-02-13: qty 1

## 2016-02-13 MED ORDER — SODIUM CHLORIDE 0.9 % IV BOLUS (SEPSIS)
1000.0000 mL | Freq: Once | INTRAVENOUS | Status: AC
  Administered 2016-02-13: 1000 mL via INTRAVENOUS

## 2016-02-13 MED ORDER — METRONIDAZOLE 500 MG PO TABS
2000.0000 mg | ORAL_TABLET | Freq: Once | ORAL | Status: AC
  Administered 2016-02-13: 2000 mg via ORAL
  Filled 2016-02-13: qty 4

## 2016-02-13 MED ORDER — METRONIDAZOLE 500 MG PO TABS: 500.0000 mg | ORAL_TABLET | Freq: Two times a day (BID) | ORAL | Status: DC

## 2016-02-13 MED ORDER — ACETAMINOPHEN 325 MG PO TABS
650.0000 mg | ORAL_TABLET | Freq: Once | ORAL | Status: AC
  Administered 2016-02-13: 650 mg via ORAL
  Filled 2016-02-13: qty 2

## 2016-02-13 MED ORDER — OSELTAMIVIR PHOSPHATE 75 MG PO CAPS: 75.0000 mg | ORAL_CAPSULE | Freq: Two times a day (BID) | ORAL | Status: DC

## 2016-02-13 NOTE — ED Notes (Signed)
Pt transported to XRay 

## 2016-02-13 NOTE — Discharge Instructions (Signed)
[name],  Nice meeting you! Please follow-up with your primary care provider and neurosurgery. Your kidney function was decreased today. Stay hydrated. Your vertebra in your lower back have moved since your last xray. You need to follow-up with neurosurgery for this. Follow-up with your family doctor for the bumps on your back. Return to the emergency department if you develop increased chest pain, shortness of breath, are unable to keep foods down, lose control of your bladder/bowel. Feel better soon!  S. Lane Hacker, PA-C Upper Respiratory Infection, Adult Most upper respiratory infections (URIs) are a viral infection of the air passages leading to the lungs. A URI affects the nose, throat, and upper air passages. The most common type of URI is nasopharyngitis and is typically referred to as "the common cold." URIs run their course and usually go away on their own. Most of the time, a URI does not require medical attention, but sometimes a bacterial infection in the upper airways can follow a viral infection. This is called a secondary infection. Sinus and middle ear infections are common types of secondary upper respiratory infections. Bacterial pneumonia can also complicate a URI. A URI can worsen asthma and chronic obstructive pulmonary disease (COPD). Sometimes, these complications can require emergency medical care and may be life threatening.  CAUSES Almost all URIs are caused by viruses. A virus is a type of germ and can spread from one person to another.  RISKS FACTORS You may be at risk for a URI if:   You smoke.   You have chronic heart or lung disease.  You have a weakened defense (immune) system.   You are very young or very old.   You have nasal allergies or asthma.  You work in crowded or poorly ventilated areas.  You work in health care facilities or schools. SIGNS AND SYMPTOMS  Symptoms typically develop 2-3 days after you come in contact with a cold virus.  Most viral URIs last 7-10 days. However, viral URIs from the influenza virus (flu virus) can last 14-18 days and are typically more severe. Symptoms may include:   Runny or stuffy (congested) nose.   Sneezing.   Cough.   Sore throat.   Headache.   Fatigue.   Fever.   Loss of appetite.   Pain in your forehead, behind your eyes, and over your cheekbones (sinus pain).  Muscle aches.  DIAGNOSIS  Your health care provider may diagnose a URI by:  Physical exam.  Tests to check that your symptoms are not due to another condition such as:  Strep throat.  Sinusitis.  Pneumonia.  Asthma. TREATMENT  A URI goes away on its own with time. It cannot be cured with medicines, but medicines may be prescribed or recommended to relieve symptoms. Medicines may help:  Reduce your fever.  Reduce your cough.  Relieve nasal congestion. HOME CARE INSTRUCTIONS   Take medicines only as directed by your health care provider.   Gargle warm saltwater or take cough drops to comfort your throat as directed by your health care provider.  Use a warm mist humidifier or inhale steam from a shower to increase air moisture. This may make it easier to breathe.  Drink enough fluid to keep your urine clear or pale yellow.   Eat soups and other clear broths and maintain good nutrition.   Rest as needed.   Return to work when your temperature has returned to normal or as your health care provider advises. You may need  to stay home longer to avoid infecting others. You can also use a face mask and careful hand washing to prevent spread of the virus.  Increase the usage of your inhaler if you have asthma.   Do not use any tobacco products, including cigarettes, chewing tobacco, or electronic cigarettes. If you need help quitting, ask your health care provider. PREVENTION  The best way to protect yourself from getting a cold is to practice good hygiene.   Avoid oral or hand contact  with people with cold symptoms.   Wash your hands often if contact occurs.  There is no clear evidence that vitamin C, vitamin E, echinacea, or exercise reduces the chance of developing a cold. However, it is always recommended to get plenty of rest, exercise, and practice good nutrition.  SEEK MEDICAL CARE IF:   You are getting worse rather than better.   Your symptoms are not controlled by medicine.   You have chills.  You have worsening shortness of breath.  You have brown or red mucus.  You have yellow or brown nasal discharge.  You have pain in your face, especially when you bend forward.  You have a fever.  You have swollen neck glands.  You have pain while swallowing.  You have white areas in the back of your throat. SEEK IMMEDIATE MEDICAL CARE IF:   You have severe or persistent:  Headache.  Ear pain.  Sinus pain.  Chest pain.  You have chronic lung disease and any of the following:  Wheezing.  Prolonged cough.  Coughing up blood.  A change in your usual mucus.  You have a stiff neck.  You have changes in your:  Vision.  Hearing.  Thinking.  Mood. MAKE SURE YOU:   Understand these instructions.  Will watch your condition.  Will get help right away if you are not doing well or get worse.   This information is not intended to replace advice given to you by your health care provider. Make sure you discuss any questions you have with your health care provider.   Document Released: 05/17/2001 Document Revised: 04/07/2015 Document Reviewed: 02/26/2014 Elsevier Interactive Patient Education [employer]2016 [employer] Inc.

## 2016-02-13 NOTE — ED Notes (Signed)
MD at bedside. 

## 2016-02-13 NOTE — ED Provider Notes (Signed)
CSN: [id number]     Arrival date & time 02/13/16  1731 History   First MD Initiated Contact with Patient 02/13/16 1929     Chief Complaint  Patient presents with  . Chest Pain  . Back Pain   HPI   Dale [name] is a 50 y.o. male PMH significant for depression presenting with a 2 day history of chest and back pain. He states his chest pain as left-sided in location, is intermittent, poking sensation, radiating down to his midline lower back and left lower back, is 10 out of 10 pain scale, nonexertional, pleuritic. He endorses chills, nonproductive cough, nausea, vomiting, diarrhea. He endorses ill contacts. He denies shortness of breath, abdominal pain, IV drug use, cocaine use.  Past Medical History  Diagnosis Date  . Depression    Past Surgical History  Procedure Laterality Date  . Ankle surgery     History reviewed. No pertinent family history. Social History  Substance Use Topics  . Smoking status: Current Every Day Smoker -- 0.10 packs/day    Types: Cigarettes  . Smokeless tobacco: None  . Alcohol Use: Yes    Review of Systems  Ten systems are reviewed and are negative for acute change except as noted in the HPI  Allergies  Review of patient's allergies indicates no known allergies.  Home Medications   Prior to Admission medications   Medication Sig Start Date End Date Taking? Authorizing Provider  methocarbamol (ROBAXIN) 500 MG tablet Take 1 tablet (500 mg total) by mouth 2 (two) times daily. Patient not taking: Reported on 07/30/2015 08/25/14   Tatyana Kirichenko, PA-C  naproxen (NAPROSYN) 500 MG tablet Take 1 tablet (500 mg total) by mouth 2 (two) times daily. Patient not taking: Reported on 07/30/2015 08/25/14   Tatyana Kirichenko, PA-C  traMADol (ULTRAM) 50 MG tablet Take 1 tablet (50 mg total) by mouth every 6 (six) hours as needed. Patient not taking: Reported on 07/30/2015 08/25/14   Tatyana Kirichenko, PA-C   BP 104/66 mmHg  Pulse 92  Temp(Src) 101.1 F (38.4  C) (Rectal)  Resp 20  SpO2 94% Physical Exam  Constitutional: He appears well-developed and well-nourished. No distress.  HENT:  Head: Normocephalic and atraumatic.  Mouth/Throat: Oropharynx is clear and moist. No oropharyngeal exudate.  Eyes: Conjunctivae are normal. Pupils are equal, round, and reactive to light. Right eye exhibits no discharge. Left eye exhibits no discharge. No scleral icterus.  Neck: No tracheal deviation present.  Cardiovascular: Normal rate, regular rhythm, normal heart sounds and intact distal pulses.  Exam reveals no gallop and no friction rub.   No murmur heard. Pulmonary/Chest: Effort normal and breath sounds normal. No respiratory distress. He has no wheezes. He has no rales. He exhibits tenderness.  Left-sided chest tenderness  Abdominal: Soft. Bowel sounds are normal. He exhibits no distension and no mass. There is no tenderness. There is no rebound and no guarding.  Musculoskeletal: He exhibits no edema.  Lymphadenopathy:    He has no cervical adenopathy.  Neurological: He is alert. Coordination normal.  Diffuse hyperesthesias  Skin: Skin is warm and dry. No rash noted. He is not diaphoretic. No erythema.  Two 2 x 2 inch masses-1 between patient's shoulder blades, 1 left mid back. No erythema, drainage, tenderness.  Psychiatric: He has a normal mood and affect. His behavior is normal.  Nursing note and vitals reviewed.   ED Course  Procedures  Labs Review Labs Reviewed  BASIC METABOLIC PANEL - Abnormal; Notable for the following:  Sodium 127 (*)    Chloride 96 (*)    CO2 18 (*)    Glucose, Bld 100 (*)    BUN 27 (*)    Creatinine, Ser 1.84 (*)    Calcium 7.7 (*)    GFR calc non Af Amer 41 (*)    GFR calc Af Amer 47 (*)    All other components within normal limits  CBC - Abnormal; Notable for the following:    WBC 13.3 (*)    RBC 4.01 (*)    Hemoglobin 12.6 (*)    HCT 34.9 (*)    MCHC 36.1 (*)    Platelets 147 (*)    All other  components within normal limits  TROPONIN I  URINALYSIS, ROUTINE W REFLEX MICROSCOPIC (NOT AT [facility])  LACTIC ACID, PLASMA  LACTIC ACID, PLASMA    Imaging Review Dg Chest 2 View  02/13/2016  CLINICAL DATA:  50 year old male with history of anterior chest pain for the past 2 days. Low-grade fever. Dizziness. EXAM: CHEST  2 VIEW COMPARISON:  Chest x-ray 06/01/2009. FINDINGS: Lung volumes are normal. No consolidative airspace disease. No pleural effusions. No pneumothorax. No pulmonary nodule or mass noted. Pulmonary vasculature and the cardiomediastinal silhouette are within normal limits. IMPRESSION: No radiographic evidence of acute cardiopulmonary disease. Electronically Signed   By: Trudie Reed M.D.   On: 02/13/2016 17:57   Dg Thoracic Spine 2 View  02/13/2016  CLINICAL DATA:  O thoracic and lower back pain for 1 week. Fall 2 days ago. Pain was before the fall. EXAM: THORACIC SPINE 2 VIEWS COMPARISON:  Chest x-ray today and 06/01/2009 FINDINGS: There is no evidence of thoracic spine fracture. Alignment is normal. No other significant bone abnormalities are identified. IMPRESSION: Negative. Electronically Signed   By: Charlett Nose M.D.   On: 02/13/2016 20:45   Dg Lumbar Spine Complete  02/13/2016  CLINICAL DATA:  Low back pain and recent fall. EXAM: LUMBAR SPINE - COMPLETE 4+ VIEW COMPARISON:  08/25/2014 lumbar spine radiographs. FINDINGS: This report assumes 5 non rib-bearing lumbar vertebrae. Lumbar vertebral body heights are preserved, with no fracture. There is mild loss of disc height at L4-5 with associated minimal spondylosis, not appreciably changed. Otherwise preserved lumbar disc spaces. There is new 3 mm anterolisthesis at L4-5. No appreciable facet arthropathy. No appreciable foraminal stenosis. No aggressive appearing focal osseous lesions. IMPRESSION: 1. No lumbar spine fracture. 2. Stable mild degenerative disc disease at L4-5. 3. New mild 3 mm anterolisthesis at L4-5.  Electronically Signed   By: Delbert Phenix M.D.   On: 02/13/2016 20:47   I have personally reviewed and evaluated these images and lab results as part of my medical decision-making.   EKG Interpretation   Date/Time:  Saturday February 13 2016 18:02:01 EST Ventricular Rate:  103 PR Interval:  155 QRS Duration: 89 QT Interval:  334 QTC Calculation: 437 R Axis:   84 Text Interpretation:  Sinus tachycardia LAE, consider biatrial enlargement   early repol pattern Confirmed by Fayrene Fearing  MD, MARK ([id number]) on 02/13/2016  11:32:44 PM      MDM   Final diagnoses:  Upper respiratory infection  Trichomonal urethritis in male   Patient nontoxic appearing, rectal temp of 101.1. Will perform broad workup for infectious versus cardiac etiology. 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 I encouraged PCP follow-up for this. BMP demonstrates hyponatremia of 127, already creatinine of 1.84. CBC  with slight leukocytosis of 13.3. Troponin unremarkable. Lactic acid normal. Chest x-ray unremarkable. UA demonstrates large leukocytes, 6-30 WBCs, trichomonas. Discussed results with patient. Patient is to be discharged with recommendation to follow up with PCP in regards to today's hospital visit. Chest pain is not likely of cardiac or pulmonary etiology d/t presentation, PERC negative, VSS, no tracheal deviation, no JVD or new murmur, RRR, breath sounds equal bilaterally, negative troponin, and negative CXR. Thoracic spine x-ray negative. Lumbar spine x-ray demonstrates new mild 3 mm anterolisthesis at L4-L5. EKG demonstrates sinus tachycardia, LAE possible by atrial enlargement, early repolarization pattern. Pt has been advised to return to the ED if CP becomes exertional, associated with diaphoresis, radiates to left jaw/arm, worsens or becomes concerning in any way. Pt appears reliable for follow up and is agreeable to discharge. Patient discharged  with Flagyl and Tamiflu.  Case has been discussed with Dr. Fayrene FearingJames who agrees with the above plan to discharge.    Melton KrebsSamantha Nicole Adolf [place], PA-C 02/14/16 0019  Rolland PorterMark James, MD 02/22/16 [phone]

## 2016-02-13 NOTE — ED Notes (Addendum)
Pt has 2x2 inch swollen area between shoulder blades

## 2016-02-13 NOTE — ED Notes (Addendum)
Patient presents via EMS for lower back pain x2 days, radiating to left chest with intermittent numbness to bilateral hands. Describes pain as poking. Left upper chest tender upon palpation, pain also worsens with deep inhalation.   Last VS: 100/64, 100hr, 98%.

## 2016-02-16 ENCOUNTER — Emergency Department (HOSPITAL_COMMUNITY): Payer: Medicaid Other

## 2016-02-16 ENCOUNTER — Encounter (HOSPITAL_COMMUNITY): Payer: Self-pay | Admitting: Emergency Medicine

## 2016-02-16 ENCOUNTER — Inpatient Hospital Stay (HOSPITAL_COMMUNITY): Payer: Medicaid Other

## 2016-02-16 ENCOUNTER — Inpatient Hospital Stay (HOSPITAL_COMMUNITY)
Admission: EM | Admit: 2016-02-16 | Discharge: 2016-03-10 | DRG: 003 | Disposition: A | Discharge status: Skilled Nursing Facility | Payer: Medicaid Other | Attending: Internal Medicine | Admitting: Internal Medicine

## 2016-02-16 DIAGNOSIS — E876 Hypokalemia: Secondary | ICD-10-CM | POA: Diagnosis present

## 2016-02-16 DIAGNOSIS — Z452 Encounter for adjustment and management of vascular access device: Secondary | ICD-10-CM

## 2016-02-16 DIAGNOSIS — J80 Acute respiratory distress syndrome: Secondary | ICD-10-CM | POA: Diagnosis not present

## 2016-02-16 DIAGNOSIS — A403 Sepsis due to Streptococcus pneumoniae: Principal | ICD-10-CM | POA: Diagnosis present

## 2016-02-16 DIAGNOSIS — E11649 Type 2 diabetes mellitus with hypoglycemia without coma: Secondary | ICD-10-CM | POA: Diagnosis present

## 2016-02-16 DIAGNOSIS — Z5900 Homelessness unspecified: Secondary | ICD-10-CM

## 2016-02-16 DIAGNOSIS — R570 Cardiogenic shock: Secondary | ICD-10-CM | POA: Diagnosis present

## 2016-02-16 DIAGNOSIS — E871 Hypo-osmolality and hyponatremia: Secondary | ICD-10-CM | POA: Diagnosis present

## 2016-02-16 DIAGNOSIS — E1165 Type 2 diabetes mellitus with hyperglycemia: Secondary | ICD-10-CM | POA: Diagnosis present

## 2016-02-16 DIAGNOSIS — I248 Other forms of acute ischemic heart disease: Secondary | ICD-10-CM | POA: Diagnosis present

## 2016-02-16 DIAGNOSIS — E870 Hyperosmolality and hypernatremia: Secondary | ICD-10-CM | POA: Diagnosis not present

## 2016-02-16 DIAGNOSIS — E872 Acidosis, unspecified: Secondary | ICD-10-CM

## 2016-02-16 DIAGNOSIS — R6521 Severe sepsis with septic shock: Secondary | ICD-10-CM | POA: Diagnosis present

## 2016-02-16 DIAGNOSIS — I110 Hypertensive heart disease with heart failure: Secondary | ICD-10-CM | POA: Diagnosis present

## 2016-02-16 DIAGNOSIS — A530 Latent syphilis, unspecified as early or late: Secondary | ICD-10-CM | POA: Diagnosis present

## 2016-02-16 DIAGNOSIS — A4189 Other specified sepsis: Secondary | ICD-10-CM | POA: Diagnosis present

## 2016-02-16 DIAGNOSIS — R0902 Hypoxemia: Secondary | ICD-10-CM

## 2016-02-16 DIAGNOSIS — J9600 Acute respiratory failure, unspecified whether with hypoxia or hypercapnia: Secondary | ICD-10-CM | POA: Insufficient documentation

## 2016-02-16 DIAGNOSIS — Z720 Tobacco use: Secondary | ICD-10-CM | POA: Diagnosis present

## 2016-02-16 DIAGNOSIS — Z681 Body mass index (BMI) 19 or less, adult: Secondary | ICD-10-CM | POA: Diagnosis not present

## 2016-02-16 DIAGNOSIS — G9340 Encephalopathy, unspecified: Secondary | ICD-10-CM

## 2016-02-16 DIAGNOSIS — I5021 Acute systolic (congestive) heart failure: Secondary | ICD-10-CM | POA: Diagnosis not present

## 2016-02-16 DIAGNOSIS — G9341 Metabolic encephalopathy: Secondary | ICD-10-CM | POA: Diagnosis present

## 2016-02-16 DIAGNOSIS — R1310 Dysphagia, unspecified: Secondary | ICD-10-CM

## 2016-02-16 DIAGNOSIS — J9601 Acute respiratory failure with hypoxia: Secondary | ICD-10-CM | POA: Insufficient documentation

## 2016-02-16 DIAGNOSIS — J9690 Respiratory failure, unspecified, unspecified whether with hypoxia or hypercapnia: Secondary | ICD-10-CM

## 2016-02-16 DIAGNOSIS — Z515 Encounter for palliative care: Secondary | ICD-10-CM | POA: Insufficient documentation

## 2016-02-16 DIAGNOSIS — Z9889 Other specified postprocedural states: Secondary | ICD-10-CM

## 2016-02-16 DIAGNOSIS — R636 Underweight: Secondary | ICD-10-CM | POA: Diagnosis present

## 2016-02-16 DIAGNOSIS — Z7189 Other specified counseling: Secondary | ICD-10-CM | POA: Insufficient documentation

## 2016-02-16 DIAGNOSIS — B955 Unspecified streptococcus as the cause of diseases classified elsewhere: Secondary | ICD-10-CM | POA: Diagnosis present

## 2016-02-16 DIAGNOSIS — N170 Acute kidney failure with tubular necrosis: Secondary | ICD-10-CM | POA: Diagnosis present

## 2016-02-16 DIAGNOSIS — I5181 Takotsubo syndrome: Secondary | ICD-10-CM | POA: Diagnosis present

## 2016-02-16 DIAGNOSIS — G919 Hydrocephalus, unspecified: Secondary | ICD-10-CM | POA: Diagnosis present

## 2016-02-16 DIAGNOSIS — Z930 Tracheostomy status: Secondary | ICD-10-CM | POA: Insufficient documentation

## 2016-02-16 DIAGNOSIS — K7200 Acute and subacute hepatic failure without coma: Secondary | ICD-10-CM | POA: Diagnosis not present

## 2016-02-16 DIAGNOSIS — G002 Streptococcal meningitis: Secondary | ICD-10-CM

## 2016-02-16 DIAGNOSIS — I509 Heart failure, unspecified: Secondary | ICD-10-CM | POA: Insufficient documentation

## 2016-02-16 DIAGNOSIS — D649 Anemia, unspecified: Secondary | ICD-10-CM | POA: Diagnosis present

## 2016-02-16 DIAGNOSIS — R4020 Unspecified coma: Secondary | ICD-10-CM | POA: Diagnosis not present

## 2016-02-16 DIAGNOSIS — R4182 Altered mental status, unspecified: Secondary | ICD-10-CM | POA: Diagnosis present

## 2016-02-16 DIAGNOSIS — J322 Chronic ethmoidal sinusitis: Secondary | ICD-10-CM | POA: Diagnosis present

## 2016-02-16 DIAGNOSIS — E2740 Unspecified adrenocortical insufficiency: Secondary | ICD-10-CM | POA: Diagnosis present

## 2016-02-16 DIAGNOSIS — F329 Major depressive disorder, single episode, unspecified: Secondary | ICD-10-CM | POA: Diagnosis present

## 2016-02-16 DIAGNOSIS — F1910 Other psychoactive substance abuse, uncomplicated: Secondary | ICD-10-CM | POA: Insufficient documentation

## 2016-02-16 DIAGNOSIS — Z8661 Personal history of infections of the central nervous system: Secondary | ICD-10-CM | POA: Diagnosis present

## 2016-02-16 DIAGNOSIS — N179 Acute kidney failure, unspecified: Secondary | ICD-10-CM

## 2016-02-16 DIAGNOSIS — I4901 Ventricular fibrillation: Secondary | ICD-10-CM | POA: Diagnosis not present

## 2016-02-16 DIAGNOSIS — F17210 Nicotine dependence, cigarettes, uncomplicated: Secondary | ICD-10-CM | POA: Diagnosis present

## 2016-02-16 DIAGNOSIS — A419 Sepsis, unspecified organism: Secondary | ICD-10-CM | POA: Diagnosis present

## 2016-02-16 DIAGNOSIS — Z9289 Personal history of other medical treatment: Secondary | ICD-10-CM

## 2016-02-16 DIAGNOSIS — G039 Meningitis, unspecified: Secondary | ICD-10-CM | POA: Insufficient documentation

## 2016-02-16 DIAGNOSIS — A5903 Trichomonal cystitis and urethritis: Secondary | ICD-10-CM | POA: Diagnosis present

## 2016-02-16 DIAGNOSIS — I214 Non-ST elevation (NSTEMI) myocardial infarction: Secondary | ICD-10-CM | POA: Diagnosis present

## 2016-02-16 DIAGNOSIS — I469 Cardiac arrest, cause unspecified: Secondary | ICD-10-CM | POA: Diagnosis not present

## 2016-02-16 DIAGNOSIS — R000 Tachycardia, unspecified: Secondary | ICD-10-CM

## 2016-02-16 DIAGNOSIS — J9621 Acute and chronic respiratory failure with hypoxia: Secondary | ICD-10-CM | POA: Diagnosis present

## 2016-02-16 DIAGNOSIS — R0600 Dyspnea, unspecified: Secondary | ICD-10-CM

## 2016-02-16 DIAGNOSIS — T85598A Other mechanical complication of other gastrointestinal prosthetic devices, implants and grafts, initial encounter: Secondary | ICD-10-CM

## 2016-02-16 DIAGNOSIS — F1010 Alcohol abuse, uncomplicated: Secondary | ICD-10-CM | POA: Insufficient documentation

## 2016-02-16 DIAGNOSIS — Z430 Encounter for attention to tracheostomy: Secondary | ICD-10-CM | POA: Insufficient documentation

## 2016-02-16 DIAGNOSIS — Z4659 Encounter for fitting and adjustment of other gastrointestinal appliance and device: Secondary | ICD-10-CM

## 2016-02-16 HISTORY — DX: Streptococcal meningitis: G00.2

## 2016-02-16 HISTORY — DX: Acute kidney failure, unspecified: N17.9

## 2016-02-16 HISTORY — DX: Trichomonal cystitis and urethritis: A59.03

## 2016-02-16 HISTORY — DX: Tobacco use: Z72.0

## 2016-02-16 LAB — CBC WITH DIFFERENTIAL/PLATELET
BASOS PCT: 0 %
Basophils Absolute: 0 10*3/uL (ref 0.0–0.1)
Eosinophils Absolute: 0 10*3/uL (ref 0.0–0.7)
Eosinophils Relative: 0 %
HEMATOCRIT: 37.9 % — AB (ref 39.0–52.0)
HEMOGLOBIN: 13.9 g/dL (ref 13.0–17.0)
LYMPHS ABS: 1.1 10*3/uL (ref 0.7–4.0)
LYMPHS PCT: 6 %
MCH: 32.2 pg (ref 26.0–34.0)
MCHC: 36.7 g/dL — AB (ref 30.0–36.0)
MCV: 87.7 fL (ref 78.0–100.0)
MONOS PCT: 16 %
Monocytes Absolute: 2.9 10*3/uL — ABNORMAL HIGH (ref 0.1–1.0)
NEUTROS ABS: 14.5 10*3/uL — AB (ref 1.7–7.7)
NEUTROS PCT: 79 %
Platelets: 155 10*3/uL (ref 150–400)
RBC: 4.32 MIL/uL (ref 4.22–5.81)
RDW: 12.9 % (ref 11.5–15.5)
WBC: 18.4 10*3/uL — ABNORMAL HIGH (ref 4.0–10.5)

## 2016-02-16 LAB — CBC
HEMATOCRIT: 36.7 % — AB (ref 39.0–52.0)
Hemoglobin: 12.8 g/dL — ABNORMAL LOW (ref 13.0–17.0)
MCH: 30.5 pg (ref 26.0–34.0)
MCHC: 34.9 g/dL (ref 30.0–36.0)
MCV: 87.6 fL (ref 78.0–100.0)
Platelets: 137 10*3/uL — ABNORMAL LOW (ref 150–400)
RBC: 4.19 MIL/uL — ABNORMAL LOW (ref 4.22–5.81)
RDW: 12.9 % (ref 11.5–15.5)
WBC: 16.9 10*3/uL — ABNORMAL HIGH (ref 4.0–10.5)

## 2016-02-16 LAB — URINE MICROSCOPIC-ADD ON

## 2016-02-16 LAB — CSF CELL COUNT WITH DIFFERENTIAL
Appearance, CSF: UNDETERMINED — AB
Color, CSF: UNDETERMINED — AB
EOS CSF: 0 % (ref 0–1)
EOS CSF: UNDETERMINED % (ref 0–1)
LYMPHS CSF: 6 % — AB (ref 40–80)
LYMPHS CSF: UNDETERMINED % (ref 40–80)
MONOCYTE-MACROPHAGE-SPINAL FLUID: 6 % — AB (ref 15–45)
MONOCYTE-MACROPHAGE-SPINAL FLUID: UNDETERMINED % (ref 15–45)
OTHER CELLS CSF: UNDETERMINED
RBC COUNT CSF: 89 /mm3 — AB
RBC Count, CSF: UNDETERMINED /mm3
SEGMENTED NEUTROPHILS-CSF: UNDETERMINED % (ref 0–6)
SUPERNATANT: UNDETERMINED
Segmented Neutrophils-CSF: 88 % — ABNORMAL HIGH (ref 0–6)
TUBE #: 1
Tube #: 4
WBC, CSF: 340 /mm3 (ref 0–5)
WBC, CSF: UNDETERMINED /mm3 (ref 0–5)

## 2016-02-16 LAB — COMPREHENSIVE METABOLIC PANEL
ALBUMIN: 2.9 g/dL — AB (ref 3.5–5.0)
ALT: 42 U/L (ref 17–63)
ANION GAP: 19 — AB (ref 5–15)
AST: 117 U/L — ABNORMAL HIGH (ref 15–41)
Alkaline Phosphatase: 56 U/L (ref 38–126)
BUN: 17 mg/dL (ref 6–20)
CHLORIDE: 93 mmol/L — AB (ref 101–111)
CO2: 19 mmol/L — AB (ref 22–32)
Calcium: 9 mg/dL (ref 8.9–10.3)
Creatinine, Ser: 1.66 mg/dL — ABNORMAL HIGH (ref 0.61–1.24)
GFR calc non Af Amer: 46 mL/min — ABNORMAL LOW (ref >60)
GFR, EST AFRICAN AMERICAN: 54 mL/min — AB (ref >60)
Glucose, Bld: 132 mg/dL — ABNORMAL HIGH (ref 65–99)
POTASSIUM: 4.1 mmol/L (ref 3.5–5.1)
SODIUM: 131 mmol/L — AB (ref 135–145)
Total Bilirubin: 1.1 mg/dL (ref 0.3–1.2)
Total Protein: 7.5 g/dL (ref 6.5–8.1)

## 2016-02-16 LAB — I-STAT ARTERIAL BLOOD GAS, ED
ACID-BASE DEFICIT: 7 mmol/L — AB (ref 0.0–2.0)
BICARBONATE: 16.2 meq/L — AB (ref 20.0–24.0)
O2 Saturation: 99 %
PO2 ART: 146 mmHg — AB (ref 80.0–100.0)
Patient temperature: 104
TCO2: 17 mmol/L (ref 0–100)
pCO2 arterial: 30.9 mmHg — ABNORMAL LOW (ref 35.0–45.0)
pH, Arterial: 7.341 — ABNORMAL LOW (ref 7.350–7.450)

## 2016-02-16 LAB — URINALYSIS, ROUTINE W REFLEX MICROSCOPIC
BILIRUBIN URINE: NEGATIVE
Glucose, UA: NEGATIVE mg/dL
Ketones, ur: 15 mg/dL — AB
NITRITE: NEGATIVE
PH: 5.5 (ref 5.0–8.0)
Protein, ur: 100 mg/dL — AB
SPECIFIC GRAVITY, URINE: 1.016 (ref 1.005–1.030)

## 2016-02-16 LAB — CREATININE, SERUM
Creatinine, Ser: 1.28 mg/dL — ABNORMAL HIGH (ref 0.61–1.24)
GFR calc Af Amer: >60 mL/min (ref >60)

## 2016-02-16 LAB — RAPID URINE DRUG SCREEN, HOSP PERFORMED
AMPHETAMINES: NOT DETECTED
BARBITURATES: NOT DETECTED
BENZODIAZEPINES: NOT DETECTED
Cocaine: NOT DETECTED
Opiates: NOT DETECTED
TETRAHYDROCANNABINOL: POSITIVE — AB

## 2016-02-16 LAB — LACTIC ACID, PLASMA: Lactic Acid, Venous: 1.5 mmol/L (ref 0.5–2.0)

## 2016-02-16 LAB — GLUCOSE, CSF

## 2016-02-16 LAB — TRIGLYCERIDES: TRIGLYCERIDES: 54 mg/dL (ref <150)

## 2016-02-16 LAB — I-STAT CG4 LACTIC ACID, ED
LACTIC ACID, VENOUS: 2.11 mmol/L — AB (ref 0.5–2.0)
Lactic Acid, Venous: 3.45 mmol/L (ref 0.5–2.0)

## 2016-02-16 LAB — CBG MONITORING, ED: GLUCOSE-CAPILLARY: 134 mg/dL — AB (ref 65–99)

## 2016-02-16 LAB — ETHANOL

## 2016-02-16 LAB — TROPONIN I: Troponin I: 7.59 ng/mL (ref <0.031)

## 2016-02-16 LAB — PROTEIN, CSF

## 2016-02-16 MED ORDER — DEXTROSE 5 % IV SOLN: 2.0000 g | Freq: Once | INTRAVENOUS | Status: DC

## 2016-02-16 MED ORDER — FENTANYL CITRATE (PF) 100 MCG/2ML IJ SOLN
100.0000 ug | INTRAMUSCULAR | Status: DC | PRN
Start: 2016-02-16 — End: 2016-02-17

## 2016-02-16 MED ORDER — FENTANYL CITRATE (PF) 100 MCG/2ML IJ SOLN
INTRAMUSCULAR | Status: AC
  Filled 2016-02-16: qty 2

## 2016-02-16 MED ORDER — FENTANYL CITRATE (PF) 100 MCG/2ML IJ SOLN
100.0000 ug | Freq: Once | INTRAMUSCULAR | Status: AC
  Administered 2016-02-16: 100 ug via INTRAVENOUS

## 2016-02-16 MED ORDER — THIAMINE HCL 100 MG/ML IJ SOLN
100.0000 mg | Freq: Every day | INTRAMUSCULAR | Status: DC
  Administered 2016-02-16 – 2016-02-17 (×2): 100 mg via INTRAVENOUS
  Filled 2016-02-16: qty 2
  Filled 2016-02-16 (×2): qty 1

## 2016-02-16 MED ORDER — ASPIRIN 325 MG PO TABS
325.0000 mg | ORAL_TABLET | Freq: Every day | ORAL | Status: DC
  Administered 2016-02-17: 325 mg via ORAL
  Filled 2016-02-16 (×2): qty 1

## 2016-02-16 MED ORDER — SODIUM CHLORIDE 0.9 % IV SOLN
250.0000 mL | INTRAVENOUS | Status: DC | PRN
  Administered 2016-02-17: 250 mL via INTRAVENOUS

## 2016-02-16 MED ORDER — NOREPINEPHRINE BITARTRATE 1 MG/ML IV SOLN
2.0000 ug/min | INTRAVENOUS | Status: DC
  Administered 2016-02-17: 30 ug/min via INTRAVENOUS
  Administered 2016-02-17: 22 ug/min via INTRAVENOUS
  Administered 2016-02-17 (×2): 50 ug/min via INTRAVENOUS
  Administered 2016-02-17: 45 ug/min via INTRAVENOUS
  Filled 2016-02-16 (×8): qty 4

## 2016-02-16 MED ORDER — HEPARIN SODIUM (PORCINE) 5000 UNIT/ML IJ SOLN
5000.0000 [IU] | Freq: Three times a day (TID) | INTRAMUSCULAR | Status: DC
  Administered 2016-02-16 – 2016-02-17 (×3): 5000 [IU] via SUBCUTANEOUS
  Filled 2016-02-16 (×4): qty 1

## 2016-02-16 MED ORDER — FENTANYL CITRATE (PF) 100 MCG/2ML IJ SOLN
100.0000 ug | INTRAMUSCULAR | Status: DC | PRN
  Administered 2016-02-16: 100 ug via INTRAVENOUS
  Filled 2016-02-16: qty 2

## 2016-02-16 MED ORDER — SODIUM CHLORIDE 0.9 % IV SOLN
1000.0000 mL | INTRAVENOUS | Status: DC
  Administered 2016-02-16: 1000 mL via INTRAVENOUS

## 2016-02-16 MED ORDER — ALBUTEROL SULFATE (2.5 MG/3ML) 0.083% IN NEBU: 2.5000 mg | INHALATION_SOLUTION | RESPIRATORY_TRACT | Status: DC | PRN

## 2016-02-16 MED ORDER — SODIUM CHLORIDE 0.9 % IV SOLN
INTRAVENOUS | Status: DC
  Administered 2016-02-16: 20:00:00 via INTRAVENOUS

## 2016-02-16 MED ORDER — SODIUM CHLORIDE 0.9 % IV BOLUS (SEPSIS)
1000.0000 mL | INTRAVENOUS | Status: AC
  Administered 2016-02-16 (×2): 1000 mL via INTRAVENOUS

## 2016-02-16 MED ORDER — DEXTROSE 5 % IV SOLN: 2.0000 g | INTRAVENOUS | Status: DC

## 2016-02-16 MED ORDER — METOPROLOL TARTRATE 1 MG/ML IV SOLN
2.5000 mg | Freq: Four times a day (QID) | INTRAVENOUS | Status: DC
  Administered 2016-02-17 – 2016-02-18 (×2): 2.5 mg via INTRAVENOUS
  Filled 2016-02-16 (×4): qty 5

## 2016-02-16 MED ORDER — PROPOFOL 10 MG/ML IV BOLUS
40.0000 mg | Freq: Once | INTRAVENOUS | Status: AC
  Administered 2016-02-16: 40 mg via INTRAVENOUS

## 2016-02-16 MED ORDER — DEXTROSE 5 % IV SOLN
2.0000 g | Freq: Once | INTRAVENOUS | Status: AC
  Administered 2016-02-16: 2 g via INTRAVENOUS
  Filled 2016-02-16: qty 2

## 2016-02-16 MED ORDER — CEFTRIAXONE SODIUM 1 G IJ SOLR: 1.0000 g | INTRAMUSCULAR | Status: DC

## 2016-02-16 MED ORDER — ACETAMINOPHEN 650 MG RE SUPP
650.0000 mg | RECTAL | Status: DC | PRN
  Administered 2016-02-16 (×2): 650 mg via RECTAL
  Filled 2016-02-16 (×2): qty 1

## 2016-02-16 MED ORDER — FOLIC ACID 5 MG/ML IJ SOLN
1.0000 mg | Freq: Every day | INTRAMUSCULAR | Status: DC
  Administered 2016-02-17: 1 mg via INTRAVENOUS
  Filled 2016-02-16: qty 0.2
  Filled 2016-02-16 (×2): qty 0
  Filled 2016-02-16: qty 0.2

## 2016-02-16 MED ORDER — VANCOMYCIN HCL IN DEXTROSE 750-5 MG/150ML-% IV SOLN: 750.0000 mg | INTRAVENOUS | Status: DC

## 2016-02-16 MED ORDER — ETOMIDATE 2 MG/ML IV SOLN
INTRAVENOUS | Status: DC | PRN
  Administered 2016-02-16: 15 mg via INTRAVENOUS

## 2016-02-16 MED ORDER — HALOPERIDOL LACTATE 5 MG/ML IJ SOLN
2.0000 mg | Freq: Once | INTRAMUSCULAR | Status: AC
  Administered 2016-02-16: 2 mg via INTRAVENOUS
  Filled 2016-02-16: qty 1

## 2016-02-16 MED ORDER — SODIUM CHLORIDE 0.9 % IV BOLUS (SEPSIS)
500.0000 mL | INTRAVENOUS | Status: AC
  Administered 2016-02-16: 500 mL via INTRAVENOUS

## 2016-02-16 MED ORDER — PROPOFOL 1000 MG/100ML IV EMUL: 5.0000 ug/kg/min | INTRAVENOUS | Status: DC

## 2016-02-16 MED ORDER — PROPOFOL 1000 MG/100ML IV EMUL: 5.0000 ug/kg/min | Freq: Once | INTRAVENOUS | Status: DC

## 2016-02-16 MED ORDER — PANTOPRAZOLE SODIUM 40 MG IV SOLR
40.0000 mg | Freq: Every day | INTRAVENOUS | Status: DC
  Administered 2016-02-16 – 2016-02-17 (×2): 40 mg via INTRAVENOUS
  Filled 2016-02-16 (×3): qty 40

## 2016-02-16 MED ORDER — FENTANYL CITRATE (PF) 100 MCG/2ML IJ SOLN
100.0000 ug | Freq: Once | INTRAMUSCULAR | Status: AC
  Administered 2016-02-16: 100 ug via INTRAVENOUS
  Filled 2016-02-16: qty 2

## 2016-02-16 MED ORDER — PROPOFOL 1000 MG/100ML IV EMUL
0.0000 ug/kg/min | INTRAVENOUS | Status: DC
  Administered 2016-02-16: 5 ug/kg/min via INTRAVENOUS
  Filled 2016-02-16 (×2): qty 100

## 2016-02-16 MED ORDER — ROCURONIUM BROMIDE 50 MG/5ML IV SOLN
INTRAVENOUS | Status: DC | PRN
  Administered 2016-02-16: 75 mg via INTRAVENOUS

## 2016-02-16 MED ORDER — DEXAMETHASONE SODIUM PHOSPHATE 10 MG/ML IJ SOLN
10.0000 mg | Freq: Once | INTRAMUSCULAR | Status: AC
  Administered 2016-02-16: 10 mg via INTRAVENOUS
  Filled 2016-02-16: qty 1

## 2016-02-16 MED ORDER — SODIUM CHLORIDE 0.9 % IV SOLN
2.0000 g | Freq: Three times a day (TID) | INTRAVENOUS | Status: DC
  Administered 2016-02-16 – 2016-02-17 (×3): 2 g via INTRAVENOUS
  Filled 2016-02-16 (×5): qty 2000

## 2016-02-16 MED ORDER — IPRATROPIUM BROMIDE 0.02 % IN SOLN: 0.5000 mg | RESPIRATORY_TRACT | Status: DC | PRN

## 2016-02-16 MED ORDER — DEXTROSE 5 % IV SOLN
2.0000 g | Freq: Two times a day (BID) | INTRAVENOUS | Status: AC
  Administered 2016-02-17 – 2016-02-29 (×26): 2 g via INTRAVENOUS
  Filled 2016-02-16 (×27): qty 2

## 2016-02-16 MED ORDER — VANCOMYCIN HCL IN DEXTROSE 1-5 GM/200ML-% IV SOLN
1000.0000 mg | Freq: Once | INTRAVENOUS | Status: AC
  Administered 2016-02-16: 1000 mg via INTRAVENOUS
  Filled 2016-02-16: qty 200

## 2016-02-16 NOTE — Progress Notes (Addendum)
eLink Physician-Brief Progress Note Patient Name: Dale [name] DOB: 09/12/1966 MRN: [id number]   Date of Service  02/16/2016  HPI/Events of Note  Troponin = 7.59. HR = 160's Sinus Tachycardia. Demand ischemia?   eICU Interventions  Will order: 1. ASA 325 mg per tube now and Q day.  2. Metoprolol 2.5 mg IV now and Q 6 hours.  3. Continue to cycle troponin.     Intervention Category Intermediate Interventions: Diagnostic test evaluation  Dale [name],Dale [name] 02/16/2016, 9:11 PM

## 2016-02-16 NOTE — Progress Notes (Signed)
Pharmacy Antibiotic Note  Dale [name] is a 50 y.o. male admitted on 02/16/2016 with UTI.  Pharmacy has been consulted for ceftriaxone dosing. WBC 13.3, LA 1.1  Pharmacy now consulted to dose vanc/amp/CTX for sepsis, meningitis? To get LP  Plan: CTX to 2g IV q12h Vanc 1g IV x1; then 750mg  IV q24h Ampicillin 2g IV 8h Monitor clinical progress, c/s, renal function, abx plan/LOT VT@SS  as indicated F/u LP results    No data recorded.   Recent Labs Lab 02/13/16 1753 02/13/16 2016 02/16/16 1200 02/16/16 1209  WBC 13.3*  --  18.4*  --   CREATININE 1.84*  --  1.66*  --   LATICACIDVEN  --  1.1  --  3.45*    CrCl cannot be calculated (Unknown ideal weight.).    No Known Allergies  Antimicrobials this admission: 3/14 CTX >>  3/14 vanc>> 3/14 amp>>  Microbiology results: 3/14 BCx:  3/14 UCx:    Babs BertinHaley Silva Aamodt, PharmD, BCPS Clinical Pharmacist Pager [phone] 02/16/2016 2:32 PM   Pharmacy Code Sepsis Protocol  Time of code sepsis page: 1141 [x]  Antibiotics delivered at: 1145  Were antibiotics ordered at the time of the code sepsis page? Yes Was it required to contact the physician? [x]  Physician not contacted []  Physician contacted to order antibiotics for code sepsis []  Physician contacted to recommend changing antibiotics  Pharmacy consulted for: ceftriaxone  Anti-infectives    Start     Dose/Rate Route Frequency Ordered Stop   02/17/16 1200  cefTRIAXone (ROCEPHIN) 1 g in dextrose 5 % 50 mL IVPB  Status:  Discontinued     1 g 100 mL/hr over 30 Minutes Intravenous Every 24 hours 02/16/16 1143 02/16/16 1144   02/17/16 1200  cefTRIAXone (ROCEPHIN) 2 g in dextrose 5 % 50 mL IVPB     2 g 100 mL/hr over 30 Minutes Intravenous Every 24 hours 02/16/16 1144     02/16/16 1145  cefTRIAXone (ROCEPHIN) 2 g in dextrose 5 % 50 mL IVPB  Status:  Discontinued     2 g 100 mL/hr over 30 Minutes Intravenous  Once 02/16/16 1130 02/16/16 1136   02/16/16 1145  cefTRIAXone (ROCEPHIN) 1  g in dextrose 5 % 50 mL IVPB  Status:  Discontinued     1 g 100 mL/hr over 30 Minutes Intravenous Every 24 hours 02/16/16 1136 02/16/16 1143   02/16/16 1145  cefTRIAXone (ROCEPHIN) 2 g in dextrose 5 % 50 mL IVPB     2 g 100 mL/hr over 30 Minutes Intravenous  Once 02/16/16 1143 02/16/16 1226        Nurse education provided: [x]  Minutes left to administer antibiotics to achieve 1 hour goal [x]  Correct order of antibiotic administration [x]  Antibiotic Y-site compatibilities     Babs BertinHaley Feleica Fulmore, PharmD, BCPS Clinical Pharmacist Pager [phone] 02/16/2016 2:32 PM

## 2016-02-16 NOTE — ED Notes (Signed)
Pt arrives via [facility], ems reports they were called out by patient's family who states patient showed up at their house and seemed confused, pt normally a/o x4 according to ems's report from family. EMS reports pt only answers "yep" to all questions asked, does not follow directions but does move all limbs. Pt seen at [place] on 3/11, dx with URI and given tamiflu that he did not get filled.

## 2016-02-16 NOTE — ED Notes (Signed)
Critical care physician at bedside at this time.

## 2016-02-16 NOTE — Consult Note (Addendum)
[facility] for Infectious Disease  Total days of antibiotics 1        Day 1 amp        Day 1 vanco/ctx               Reason for Consult: bacterial meningitis    Referring Physician: [place] Pink  Principal Problem:   Gram positive sepsis ([place]) Active Problems:   Trichomonal urethritis in male   Homeless   Meningitis, streptococcal   Tobacco abuse   AKI (acute kidney injury) ([place])   Encephalopathy, metabolic   Underweight   Meningitis    HPI: Dale [name] is a 50 y.o. male with limited past med hx of depression and homelessness who presented to the ED with new onset confusion, brought in by family. He previously was seen in the ED on 3/11 with uri symptoms but also had chest pain that radiated to his back per ED notes, no mention of neck pain. On admission, he was found to be confused, febrile to 104, and physical exam revealing nuchal rigidity. HIs labs also revealed leukocytosis of WBC 18.4K, aki with cr 1.66, AG 19, and LA of 3.45.  He was initiated on meningitis coverage with steroids, vanco/ctx plus ampicillin and shortly thereafter underwent LP which showed neutrophilic predominant pleocytosis with WBC of 340, with 88%N, protein>600, glu<20 and gram stain showing GPC in pairs. I spoke to micro lab who confirmed the findings. His infectious work up also had blood cx and urine cx. Within the next 6 hrs, he decompensated and became increasingly obtunded requiring intubation. He still remains in critical condition, persistently febrile with temps 104F with associated tachycardia up to 160s and appears to have demand ischemia vs. ACS with initial trop of 7.58 (previously negative on first ED visit on 3/11). BP maintaining MAP>70  NCHCT showed mild hydrocephaly but appears to have maxillary, ethmoid, sphenoid opacification c/w multifocal sinusitis.CXR not showing any significant infiltrate. After intubation, cxr shows mild cephalization per my read  His visit on 3/11 to the ED shows  that he reported 2 days of feeling poorly with chills, nonproductive cough ,N/V/D and some element of pleuretic chest pain. his cxr was unremarkable, but he did have fever of 101, leukocytosis of 13K at the time. He reports having numerous sick contacts. He was treated empirically for influenza but family reports that he did appear to have filed his oseltamivir prescription. He was also treated for trichomonas as part of infectious work up to the fever.no blood cx drawn at the time  hpi extracted from chart since patient is sedated and intubated  Past Medical History  Diagnosis Date  . Depression     Allergies: No Known Allergies   MEDICATIONS: . aspirin  325 mg Oral Daily  . folic acid  1 mg Intravenous Daily  . heparin  5,000 Units Subcutaneous 3 times per day  . [START ON 02/17/2016] metoprolol  2.5 mg Intravenous 4 times per day  . pantoprazole (PROTONIX) IV  40 mg Intravenous Daily  . thiamine  100 mg Intravenous Daily  . [START ON 02/17/2016] vancomycin  750 mg Intravenous Q24H    Social History  Substance Use Topics  . Smoking status: Current Every Day Smoker -- 0.10 packs/day    Types: Cigarettes  . Smokeless tobacco: None  . Alcohol Use: Yes  - dose drink alcohol, but son unaware if he is currently drinking significantly  Family hx : significant for CAD, DM, HTN, no hx of liver  disease  Review of Systems - unable to obtain since patient is obtunded/sedated/intubated   OBJECTIVE: Temp:  [104 F (40 C)-105.8 F (41 C)] 105.8 F (41 C) (03/14 2100) Pulse Rate:  [62-154] 147 (03/14 1915) Resp:  [10-35] 26 (03/14 2100) BP: (109-175)/(70-118) 112/88 mmHg (03/14 2100) SpO2:  [80 %-100 %] 91 % (03/14 1950) FiO2 (%):  [100 %] 100 % (03/14 1950) Weight:  [105 lb (47.628 kg)] 105 lb (47.628 kg) (03/14 1400) Physical Exam  Constitutional: He is sedated, intubated He appears well-developed and thin.  HENT: endotrach tube in place, whitish secretion from nares Mouth/Throat:  endotrach tube in place Cardiovascular: tachycardic. Exam reveals no gallop and no friction rub.  No murmur heard. Pulses faint Pulmonary/Chest: Effort normal and breath sounds normal. No respiratory distress. He has no wheezes.  Abdominal: Soft. Bowel sounds are decreased. He exhibits no distension. There is no tenderness.  Lymphadenopathy:  no cervical adenopathy.  Skin: mild excoriation to arms, dry skin but torso and upper arms, and legs are without any rashes. Scattered tattoos Psychiatric: unable to examine    LABS: Results for orders placed or performed during the hospital encounter of 02/16/16 (from the past 48 hour(s))  CBG monitoring, ED     Status: Abnormal   Collection Time: 02/16/16 11:26 AM  Result Value Ref Range   Glucose-Capillary 134 (H) 65 - 99 mg/dL  Urinalysis, Routine w reflex microscopic (not at [facility])     Status: Abnormal   Collection Time: 02/16/16 11:40 AM  Result Value Ref Range   Color, Urine YELLOW YELLOW   APPearance CLOUDY (A) CLEAR   Specific Gravity, Urine 1.016 1.005 - 1.030   pH 5.5 5.0 - 8.0   Glucose, UA NEGATIVE NEGATIVE mg/dL   Hgb urine dipstick LARGE (A) NEGATIVE   Bilirubin Urine NEGATIVE NEGATIVE   Ketones, ur 15 (A) NEGATIVE mg/dL   Protein, ur 100 (A) NEGATIVE mg/dL   Nitrite NEGATIVE NEGATIVE   Leukocytes, UA TRACE (A) NEGATIVE  Urine rapid drug screen (hosp performed)     Status: Abnormal   Collection Time: 02/16/16 11:40 AM  Result Value Ref Range   Opiates NONE DETECTED NONE DETECTED   Cocaine NONE DETECTED NONE DETECTED   Benzodiazepines NONE DETECTED NONE DETECTED   Amphetamines NONE DETECTED NONE DETECTED   Tetrahydrocannabinol POSITIVE (A) NONE DETECTED   Barbiturates NONE DETECTED NONE DETECTED    Comment:        DRUG SCREEN FOR MEDICAL PURPOSES ONLY.  IF CONFIRMATION IS NEEDED FOR ANY PURPOSE, NOTIFY LAB WITHIN 5 DAYS.        LOWEST DETECTABLE LIMITS FOR URINE DRUG SCREEN Drug Class       Cutoff (ng/mL) Amphetamine       1000 Barbiturate      200 Benzodiazepine   127 Tricyclics       517 Opiates          300 Cocaine          300 THC              50   Urine microscopic-add on     Status: Abnormal   Collection Time: 02/16/16 11:40 AM  Result Value Ref Range   Squamous Epithelial / LPF 0-5 (A) NONE SEEN   WBC, UA 6-30 0 - 5 WBC/hpf   RBC / HPF 0-5 0 - 5 RBC/hpf   Bacteria, UA FEW (A) NONE SEEN   Casts GRANULAR CAST (A) NEGATIVE   Urine-Other AMORPHOUS URATES/PHOSPHATES  Comprehensive metabolic panel     Status: Abnormal   Collection Time: 02/16/16 12:00 PM  Result Value Ref Range   Sodium 131 (L) 135 - 145 mmol/L   Potassium 4.1 3.5 - 5.1 mmol/L   Chloride 93 (L) 101 - 111 mmol/L   CO2 19 (L) 22 - 32 mmol/L   Glucose, Bld 132 (H) 65 - 99 mg/dL   BUN 17 6 - 20 mg/dL   Creatinine, Ser 1.66 (H) 0.61 - 1.24 mg/dL   Calcium 9.0 8.9 - 10.3 mg/dL   Total Protein 7.5 6.5 - 8.1 g/dL   Albumin 2.9 (L) 3.5 - 5.0 g/dL   AST 117 (H) 15 - 41 U/L   ALT 42 17 - 63 U/L   Alkaline Phosphatase 56 38 - 126 U/L   Total Bilirubin 1.1 0.3 - 1.2 mg/dL   GFR calc non Af Amer 46 (L) >60 mL/min   GFR calc Af Amer 54 (L) >60 mL/min    Comment: (NOTE) The eGFR has been calculated using the CKD EPI equation. This calculation has not been validated in all clinical situations. eGFR's persistently <60 mL/min signify possible Chronic Kidney Disease.    Anion gap 19 (H) 5 - 15  CBC WITH DIFFERENTIAL     Status: Abnormal   Collection Time: 02/16/16 12:00 PM  Result Value Ref Range   WBC 18.4 (H) 4.0 - 10.5 K/uL   RBC 4.32 4.22 - 5.81 MIL/uL   Hemoglobin 13.9 13.0 - 17.0 g/dL   HCT 37.9 (L) 39.0 - 52.0 %   MCV 87.7 78.0 - 100.0 fL   MCH 32.2 26.0 - 34.0 pg   MCHC 36.7 (H) 30.0 - 36.0 g/dL   RDW 12.9 11.5 - 15.5 %   Platelets 155 150 - 400 K/uL   Neutrophils Relative % 79 %   Neutro Abs 14.5 (H) 1.7 - 7.7 K/uL   Lymphocytes Relative 6 %   Lymphs Abs 1.1 0.7 - 4.0 K/uL   Monocytes Relative 16 %   Monocytes  Absolute 2.9 (H) 0.1 - 1.0 K/uL   Eosinophils Relative 0 %   Eosinophils Absolute 0.0 0.0 - 0.7 K/uL   Basophils Relative 0 %   Basophils Absolute 0.0 0.0 - 0.1 K/uL  I-Stat CG4 Lactic Acid, ED  (not at  [facility])     Status: Abnormal   Collection Time: 02/16/16 12:09 PM  Result Value Ref Range   Lactic Acid, Venous 3.45 (HH) 0.5 - 2.0 mmol/L   Comment NOTIFIED PHYSICIAN   Ethanol     Status: None   Collection Time: 02/16/16 12:39 PM  Result Value Ref Range   Alcohol, Ethyl (B) <5 <5 mg/dL    Comment:        LOWEST DETECTABLE LIMIT FOR SERUM ALCOHOL IS 5 mg/dL FOR MEDICAL PURPOSES ONLY   CSF cell count with differential collection tube #: 1     Status: Abnormal   Collection Time: 02/16/16  4:39 PM  Result Value Ref Range   Tube # 1    Color, CSF QUANTITY NOT SUFFICIENT, UNABLE TO PERFORM TEST (A) COLORLESS    Comment: CALLED TO N STEPHENS,RN 1735 02/16/16 D BRADLEY   Appearance, CSF QUANTITY NOT SUFFICIENT, UNABLE TO PERFORM TEST (A) CLEAR   Supernatant QUANTITY NOT SUFFICIENT, UNABLE TO PERFORM TEST    RBC Count, CSF QUANTITY NOT SUFFICIENT, UNABLE TO PERFORM TEST 0 /cu mm   WBC, CSF QUANTITY NOT SUFFICIENT, UNABLE TO PERFORM TEST 0 - 5 /cu mm  Segmented Neutrophils-CSF QUANTITY NOT SUFFICIENT, UNABLE TO PERFORM TEST 0 - 6 %   Lymphs, CSF QUANTITY NOT SUFFICIENT, UNABLE TO PERFORM TEST 40 - 80 %   Monocyte-Macrophage-Spinal Fluid QUANTITY NOT SUFFICIENT, UNABLE TO PERFORM TEST 15 - 45 %   Eosinophils, CSF QUANTITY NOT SUFFICIENT, UNABLE TO PERFORM TEST 0 - 1 %   Other Cells, CSF QUANTITY NOT SUFFICIENT, UNABLE TO PERFORM TEST   CSF cell count with differential collection tube #: 4     Status: Abnormal   Collection Time: 02/16/16  4:43 PM  Result Value Ref Range   Tube # 4    Color, CSF STRAW (A) COLORLESS   Appearance, CSF CLEAR CLEAR   Supernatant NOT INDICATED    RBC Count, CSF 89 (H) 0 /cu mm   WBC, CSF 340 (HH) 0 - 5 /cu mm    Comment: CRITICAL RESULT CALLED TO, READ BACK  BY AND VERIFIED WITH: N STEPHENS,RN 1735 02/16/16 D BRADLEY    Segmented Neutrophils-CSF 88 (H) 0 - 6 %   Lymphs, CSF 6 (L) 40 - 80 %   Monocyte-Macrophage-Spinal Fluid 6 (L) 15 - 45 %   Eosinophils, CSF 0 0 - 1 %  Glucose, CSF     Status: Abnormal   Collection Time: 02/16/16  4:44 PM  Result Value Ref Range   Glucose, CSF <20 (LL) 40 - 70 mg/dL    Comment: CRITICAL RESULT CALLED TO, READ BACK BY AND VERIFIED WITH: STEVENS,N RN '@[id number]'$  BY GRINSTEAD,C 3.14.17   CSF culture with Stat gram stain     Status: None (Preliminary result)   Collection Time: 02/16/16  4:44 PM  Result Value Ref Range   Specimen Description CSF    Special Requests NONE    Gram Stain      WBC PRESENT,BOTH PMN AND MONONUCLEAR GRAM POSITIVE COCCI IN PAIRS Gram Stain Report Called to,Read Back By and Verified With: N Minette Brine RN 1806 02/16/16 A BROWNING CYTOSPIN    Culture PENDING    Report Status PENDING   Protein, CSF     Status: Abnormal   Collection Time: 02/16/16  4:44 PM  Result Value Ref Range   Total  Protein, CSF >600 (H) 15 - 45 mg/dL    Comment: REPEATED TO VERIFY RESULTS CONFIRMED BY MANUAL DILUTION   I-Stat CG4 Lactic Acid, ED  (not at  [facility])     Status: Abnormal   Collection Time: 02/16/16  5:17 PM  Result Value Ref Range   Lactic Acid, Venous 2.11 (HH) 0.5 - 2.0 mmol/L   Comment NOTIFIED PHYSICIAN   Lactic acid, plasma     Status: None   Collection Time: 02/16/16  8:06 PM  Result Value Ref Range   Lactic Acid, Venous 1.5 0.5 - 2.0 mmol/L  Triglycerides     Status: None   Collection Time: 02/16/16  8:16 PM  Result Value Ref Range   Triglycerides 54 <150 mg/dL  CBC     Status: Abnormal   Collection Time: 02/16/16  8:16 PM  Result Value Ref Range   WBC 16.9 (H) 4.0 - 10.5 K/uL   RBC 4.19 (L) 4.22 - 5.81 MIL/uL   Hemoglobin 12.8 (L) 13.0 - 17.0 g/dL   HCT 36.7 (L) 39.0 - 52.0 %   MCV 87.6 78.0 - 100.0 fL   MCH 30.5 26.0 - 34.0 pg   MCHC 34.9 30.0 - 36.0 g/dL   RDW 12.9 11.5 - 15.5 %    Platelets 137 (L) 150 -  400 K/uL  Creatinine, serum     Status: Abnormal   Collection Time: 02/16/16  8:16 PM  Result Value Ref Range   Creatinine, Ser 1.28 (H) 0.61 - 1.24 mg/dL   GFR calc non Af Amer >60 >60 mL/min   GFR calc Af Amer >60 >60 mL/min    Comment: (NOTE) The eGFR has been calculated using the CKD EPI equation. This calculation has not been validated in all clinical situations. eGFR's persistently <60 mL/min signify possible Chronic Kidney Disease.   Troponin I     Status: Abnormal   Collection Time: 02/16/16  8:16 PM  Result Value Ref Range   Troponin I 7.59 (HH) <0.031 ng/mL    Comment:        POSSIBLE MYOCARDIAL ISCHEMIA. SERIAL TESTING RECOMMENDED. CRITICAL RESULT CALLED TO, READ BACK BY AND VERIFIED WITH: SHANAS,B RN '@[id number]'$  BY GRINSTEAD,C 3.14.17   I-Stat arterial blood gas, ED     Status: Abnormal   Collection Time: 02/16/16  8:33 PM  Result Value Ref Range   pH, Arterial 7.341 (L) 7.350 - 7.450   pCO2 arterial 30.9 (L) 35.0 - 45.0 mmHg   pO2, Arterial 146.0 (H) 80.0 - 100.0 mmHg   Bicarbonate 16.2 (L) 20.0 - 24.0 mEq/L   TCO2 17 0 - 100 mmol/L   O2 Saturation 99.0 %   Acid-base deficit 7.0 (H) 0.0 - 2.0 mmol/L   Patient temperature 104.0 F    Collection site RADIAL, ALLEN'S TEST ACCEPTABLE    Drawn by Operator    Sample type ARTERIAL     MICRO: 3/14 blood cx pending 3/14 csf cx gram stain showing GPC in pairs IMAGING: Ct Head Wo Contrast  02/16/2016  CLINICAL DATA:  Fever and altered mental status; tremors EXAM: CT HEAD WITHOUT CONTRAST TECHNIQUE: Contiguous axial images were obtained from the base of the skull through the vertex without intravenous contrast. COMPARISON:  June 01, 2009 FINDINGS: The ventricles are mildly enlarged in a generalized manner and appear larger compared to the prior study. The sulci appear unremarkable. There is no intracranial mass, hemorrhage, extra-axial fluid collection, or midline shift. No focal gray-white compartment  lesions are identified. No evidence of acute infarct. Bony calvarium appears intact. The mastoid air cells are clear. There is opacification in the left maxillary antrum as well as in multiple ethmoid air cells bilaterally. There is mild mucosal thickening in the left sphenoid sinus region. No intraorbital lesions are apparent. IMPRESSION: Ventricles mildly enlarged with sulci appearing normal. This appearance potentially could represent a degree of normal pressure hydrocephalus. This finding also could be secondary to prior or recent meningitis meningitis. No intracranial mass, hemorrhage, or extra-axial fluid collection. No acute infarct evident. Multifocal sinusitis. Given these current findings, brain MRI pre and post-contrast may be helpful to further assess, in particular to further evaluate the meningeal regions. Electronically Signed   By: Lowella Grip III M.D.   On: 02/16/2016 14:04   Dg Chest Portable 1 View  02/16/2016  CLINICAL DATA:  Endotracheal tube placement EXAM: PORTABLE CHEST 1 VIEW COMPARISON:  Chest x-ray from earlier same day. FINDINGS: Endotracheal tube well positioned with tip approximately 2 cm above the carina. Enteric tube passes below the diaphragm. Heart size is normal. There is central pulmonary vascular congestion and mild interstitial edema. Suspect small left pleural effusion. No pneumothorax. Osseous structures about the chest are unremarkable. IMPRESSION: 1. Endotracheal tube well positioned with tip approximately 2 cm above the carina. 2. Central pulmonary vascular congestion and mild bilateral  interstitial edema suggesting mild volume overload/resuscitation efforts. No frank alveolar pulmonary edema. Electronically Signed   By: Franki Cabot M.D.   On: 02/16/2016 19:38   Dg Chest Port 1 View  02/16/2016  CLINICAL DATA:  Altered mental status.  Depression. EXAM: PORTABLE CHEST 1 VIEW COMPARISON:  02/13/2016. FINDINGS: Midline trachea. Normal heart size and mediastinal  contours. Left costophrenic angle exclusion. No pleural effusion or pneumothorax. Clear lungs. IMPRESSION: No active disease. Electronically Signed   By: Abigail Miyamoto M.D.   On: 02/16/2016 13:48    Assessment/Plan:  50 yo M with influenza like illness, possibly sinusitis starting on 3/9 progressively become worse, admitted for encephalopathy, nuchal rigidity fevers, leukocytosis with CSF c/w bacterial meningitis, likely streptococcal infection (strep pneumo vs. Group B strep). This could possibly be influenza with secondary streptococcal infection though would expect in part some changes on CXr. Patient quickly decompensated in becoming obtunded, with respiratory failure now intubated, AKI improved slightly likely due to fluid rescucitation. Patient having other end organ damage with demand ischemia vs. ACS. NCHCT suggestive of sinusitis as another possible source of infection.  - continue on current broad spectrum abtx, can continue vanco, ceftriaxone. Ampicillin would cover listeria which is usually a gram postive rod on gram stain. Can consider to discontinue ampicillin. Continue with steroids, as well. Continue on isolation for now until culture results return.  - aki = appears improving with IVF though he is mildly hypotensive which may continue to worsen. Continue to follow labs,   - respiratory failure, vent = defer to PCCM for management. Recommend to check influenza PCR, from lower respiratory sampling. Patient would not necessarily have any benefit from oseltamivir since he is roughly 5 days of having respiratory symptoms.  -demand ischemia vs. ACS = continue to follow troponins, and defer to PCCM for management  -transaminitis = continue to monitor hepatic panel, would minimize tylenol use to the 2-4 gm/day range.  - health maintenance = recommend to check for hiv and hep c antibodies  Dr. Megan Salon to provide further recs

## 2016-02-16 NOTE — ED Notes (Signed)
RN and PA at bedside

## 2016-02-16 NOTE — H&P (Signed)
History and Physical  Dale [name]:[id number] DOB: 06-30-1966 DOA: 02/16/2016  Referring physician: Elizabeth Sauer, ER PA PCP: No primary care provider on file.   Chief Complaint: Confusion   HPI: Dale [name] is a 50 y.o. male  Who is homeless with poor medical follow-up and presented to the emergency room on 3/11 with upper respiratory complaints. At that time he was evaluated with consideration for flu and given prescription for Tamiflu. Flu titer not done. In addition, noted to have Trichomonas and treated with Flagyl. Patient apparently has estranged family who live in town and went to see them today when he was feeling worse. Family the patient was confused and brought him into the emergency room. The emergency room, patient noted to have white count of 18, lactic acid level of 3 and acute kidney injury creatinine of 1.8. CT scan done noted enlarged ventricles and multifocal sinusitis. Concerns for meningitis. Patient underwent lumbar puncture by emergency room which yielded approximately 300 white blood cells and Gram stain came back soon positive for gram-positive cocci. Hospitalist called for evaluation and admission the patient. At that time patient was stable in terms of respiratory standpoint.  When I walked into the room to see patient, patient is foaming at mouth and otherwise open, completely unresponsive including to painful stimuli. Difficult to get good oxygen saturation read. Decision made to intubate patient. Spoke with critical care who've accepted patient to ICU. Spoke with infectious disease will come see patient.   Review of Systems:  Patient seen in the emergency room . Given encephalopathy, unable to get any kind of review of systems .   Past Medical History  Diagnosis Date  . Depression    Past Surgical History  Procedure Laterality Date  . Ankle surgery     Social History:  reports that he has been smoking Cigarettes.  He has been smoking about 0.10 packs per  day. He does not have any smokeless tobacco history on file. He reports that he drinks alcohol. He reports that he uses illicit drugs (Marijuana). Patient Is reported homeless  & I am unclear of his baseline  No Known Allergies  Family history: Unable to obtain secondary to encephalopathy  Prior to Admission medications   Medication Sig Start Date End Date Taking? Authorizing Provider  Omega-3 Fatty Acids (FISH OIL) 1000 MG CAPS Take 1,000 mg by mouth every Friday.   Yes Historical Provider, MD  methocarbamol (ROBAXIN) 500 MG tablet Take 1 tablet (500 mg total) by mouth 2 (two) times daily. Patient not taking: Reported on 07/30/2015 08/25/14   Tatyana Kirichenko, PA-C  naproxen (NAPROSYN) 500 MG tablet Take 1 tablet (500 mg total) by mouth 2 (two) times daily. Patient not taking: Reported on 07/30/2015 08/25/14   Jaynie Crumble, PA-C  oseltamivir (TAMIFLU) 75 MG capsule Take 1 capsule (75 mg total) by mouth every 12 (twelve) hours. 02/13/16   Melton Krebs, PA-C  traMADol (ULTRAM) 50 MG tablet Take 1 tablet (50 mg total) by mouth every 6 (six) hours as needed. Patient not taking: Reported on 07/30/2015 08/25/14   Jaynie Crumble, PA-C    Physical Exam: BP 153/113 mmHg  Pulse 107  Temp(Src) 104 F (40 C) (Oral)  Resp 27  Ht 5' 8.9" (1.75 m)  Wt 47.628 kg (105 lb)  BMI 15.55 kg/m2  SpO2 97%  General:  Unresponsive to painful stimuli, eyes open , emaciated Eyes: Sclera. Nonicteric  ENT: Mucous membranes, currently foaming at mouth  Neck: No JVD  Cardiovascular: Regular rhythm, tachycardic  Respiratory: Decreased breath sounds throughout, but appears clear  Abdomen: Soft, nondistended, hypoactive bowel sounds Skin: Dry skin, no skin breaks tears or lesions  Musculoskeletal: No clubbing or cyanosis or edema  Psychiatric: Currently acutely encephalopathic  Neurologic: No obvious deficits, although currently with his encephalopathy difficult to get exam           Labs on  Admission:  Basic Metabolic Panel:  Recent Labs Lab 02/13/16 1753 02/16/16 1200  NA 127* 131*  K 3.7 4.1  CL 96* 93*  CO2 18* 19*  GLUCOSE 100* 132*  BUN 27* 17  CREATININE 1.84* 1.66*  CALCIUM 7.7* 9.0   Liver Function Tests:  Recent Labs Lab 02/16/16 1200  AST 117*  ALT 42  ALKPHOS 56  BILITOT 1.1  PROT 7.5  ALBUMIN 2.9*   No results for input(s): LIPASE, AMYLASE in the last 168 hours. No results for input(s): AMMONIA in the last 168 hours. CBC:  Recent Labs Lab 02/13/16 1753 02/16/16 1200  WBC 13.3* 18.4*  NEUTROABS  --  14.5*  HGB 12.6* 13.9  HCT 34.9* 37.9*  MCV 87.0 87.7  PLT 147* 155   Cardiac Enzymes:  Recent Labs Lab 02/13/16 1753  TROPONINI <0.03    BNP (last 3 results) No results for input(s): BNP in the last 8760 hours.  ProBNP (last 3 results) No results for input(s): PROBNP in the last 8760 hours.  CBG:  Recent Labs Lab 02/16/16 1126  GLUCAP 134*    Radiological Exams on Admission: Ct Head Wo Contrast  02/16/2016  CLINICAL DATA:  Fever and altered mental status; tremors EXAM: CT HEAD WITHOUT CONTRAST TECHNIQUE: Contiguous axial images were obtained from the base of the skull through the vertex without intravenous contrast. COMPARISON:  June 01, 2009 FINDINGS: The ventricles are mildly enlarged in a generalized manner and appear larger compared to the prior study. The sulci appear unremarkable. There is no intracranial mass, hemorrhage, extra-axial fluid collection, or midline shift. No focal gray-white compartment lesions are identified. No evidence of acute infarct. Bony calvarium appears intact. The mastoid air cells are clear. There is opacification in the left maxillary antrum as well as in multiple ethmoid air cells bilaterally. There is mild mucosal thickening in the left sphenoid sinus region. No intraorbital lesions are apparent. IMPRESSION: Ventricles mildly enlarged with sulci appearing normal. This appearance potentially  could represent a degree of normal pressure hydrocephalus. This finding also could be secondary to prior or recent meningitis meningitis. No intracranial mass, hemorrhage, or extra-axial fluid collection. No acute infarct evident. Multifocal sinusitis. Given these current findings, brain MRI pre and post-contrast may be helpful to further assess, in particular to further evaluate the meningeal regions. Electronically Signed   By: Bretta Bang III M.D.   On: 02/16/2016 14:04   Dg Chest Port 1 View  02/16/2016  CLINICAL DATA:  Altered mental status.  Depression. EXAM: PORTABLE CHEST 1 VIEW COMPARISON:  02/13/2016. FINDINGS: Midline trachea. Normal heart size and mediastinal contours. Left costophrenic angle exclusion. No pleural effusion or pneumothorax. Clear lungs. IMPRESSION: No active disease. Electronically Signed   By: Jeronimo Greaves M.D.   On: 02/16/2016 13:48    EKG: Independently reviewed.  Sinus tachycardia with biatrial enlargement and left ventricular hypertrophy, states repolarization pattern, although this was seen on previous EKG  Assessment/Plan Present on Admission:  . Trichomonal urethritis in male: Treated with Flagyl at previous ER visit 4 days ago  . Meningitis, streptococcal causing gram-positive sepsis, likely  strep pneumo and causing secondary metabolic encephalopathy: Patient intubated for respiratory airway protection. Have spoken with critical care and patient will go to ICU. Infectious disease consulted. They have reviewed Gram stain and will formally consult in the morning. In the meantime, respiratory sedation, however healthcare providers do not need prophylaxis medication. Monitor blood cultures. Patient meets criteria for sepsis on admission given lactacidosis, encephalopathy, tachycardia and tachypnea with meningeal source. Continue Vanco plus ampicillin plus Rocephin.  Given recent Trichomonas, will check RPR and HIV.  . Tobacco abuse: We'll counsel once off of vent    . AKI (acute kidney injury) (HCC): Last year, normal renal function. Unclear baseline, currently aggressively hydrating   . Underweight: BMI less than 16. We'll consult nutrition although currently intubated  Consultants: Critical care consulted who will take over Infectious disease   Code Status: Full code   Family Communication:   Left message for family  Disposition Plan: Critically ill, likely will be in hospital for several days   Time spent: 60 minutes +30 minutes critical care time with direct patient care and decision making   Hollice EspyKRISHNAN,Demaria Deeney K [facility] Pager [phone]

## 2016-02-16 NOTE — ED Notes (Signed)
Patient seen and evaluated by Dr.Schlossman.  Patient with decreased mental status and poor airway control. Asked to intubate by Dr. Krishnan.   INTUBATION Performed by: Dale [name],Dale [name]  Required items: required blood products, implants, devices, and special equipment available Patient identity confirmed: provided demographic data and hospital-assigned identification number Time out: Immediately prior to procedure a "time out" was called to verify the correct patient, procedure, equipment, support staff and site/side marked as required.  Indications: unable to control airway  Intubation method: Glidescope Preoxygenation: nrv and high flow [name]  Sedatives: 15Etomidate Paralytic: Rocuronium 75 mg  Tube Size: 8 cuffed  Post-procedure assessment: chest rise and ETCO2 monitor Breath sounds: equal and absent over the epigastrium Tube secured with: ETT holder Chest x-Dale [name] interpreted by radiologist and me.  Chest x-Dale [name] findings: endotracheal tube in appropriate position  Patient tolerated the procedure well with no immediate complications.    Dale Grizzleanielle Zenab Gronewold, MD 02/16/16 [phone]

## 2016-02-16 NOTE — Sedation Documentation (Signed)
Intubation in process

## 2016-02-16 NOTE — Progress Notes (Signed)
eLink Physician-Brief Progress Note Patient Name: Dale DemarkGraig D Henningsen DOB: 04/04/1966 MRN: [id number]   Date of Service  02/16/2016  HPI/Events of Note  50 yo M admitted by PCCM for AMS, possible flu, meningitis (?bacterial), now intubated, transferred to ICU with hypotension, on prop gtt and fentanyl pushes  eICU Interventions  Cont with ns bolus, wean down prop, start levophed, cont with vanc/amp/ceftriaxone. CVL placement     Intervention Category Evaluation Type: New Patient Evaluation  Esteen Delpriore 02/16/2016, 11:54 PM

## 2016-02-16 NOTE — ED Notes (Signed)
Assisted [place], EMT and Res MD with changing of linens on stretcher, placing a clean gown on patient and a dry diaper and repositioning patient; patient now resting flat on his back

## 2016-02-16 NOTE — ED Notes (Signed)
[phone] [name] (ex wife) [phone]

## 2016-02-16 NOTE — H&P (Signed)
PULMONARY / CRITICAL CARE MEDICINE   Name: Dale [name] MRN: [id number] DOB: 1966-09-27    ADMISSION DATE:  02/16/2016 CONSULTATION DATE:  02/16/16  REFERRING MD:  Rito Ehrlich  CHIEF COMPLAINT:  AMS  HISTORY OF PRESENT ILLNESS:  Pt is encephelopathic; therefore, this HPI is obtained from chart review. Dale [name] is a 50 y.o. male with PMH as outlined below. He initially presented to [facility] ED 03/11 with URI symptoms.  He was discharged with Tamiflu given concern for flu (no PCR done).  He was also found to have trichomonas for which he was treated with flagyl.  He apparently did not fill RX for tamiflu.  On 03/14, family went to see him and when they got to his home, he told them that he was feeling worse and family felt that he was altered.  Family felt he was not himself so brought him to ED for further evaluation.  In ED, he was noted to have leukocytosis, fever to 103, initial lactate 3.  He had CT of the head which showed enlarged ventricles concerning for meningitis.  He then had LP performed in ED which showed elevated WBC's as well as GPC's on gram stain.  He was started on vanc / ceftriaxone / ampicillin and [facility] was initially called for admission.  While in ED, he had decrease in level of consciousness to the point he became minimally responsive.  He was subsequently intubated for airway protection.  PCCM was called to admit.  ID was called by [facility] and they will follow CSF studies and see pt in AM 03/15.   PAST MEDICAL HISTORY :  He  has a past medical history of Depression.  PAST SURGICAL HISTORY: He  has past surgical history that includes Ankle surgery.  No Known Allergies  No current facility-administered medications on file prior to encounter.   Current Outpatient Prescriptions on File Prior to Encounter  Medication Sig  . Omega-3 Fatty Acids (FISH OIL) 1000 MG CAPS Take 1,000 mg by mouth every Friday.  . methocarbamol (ROBAXIN) 500 MG tablet Take 1 tablet (500 mg  total) by mouth 2 (two) times daily. (Patient not taking: Reported on 07/30/2015)  . naproxen (NAPROSYN) 500 MG tablet Take 1 tablet (500 mg total) by mouth 2 (two) times daily. (Patient not taking: Reported on 07/30/2015)  . oseltamivir (TAMIFLU) 75 MG capsule Take 1 capsule (75 mg total) by mouth every 12 (twelve) hours.  . traMADol (ULTRAM) 50 MG tablet Take 1 tablet (50 mg total) by mouth every 6 (six) hours as needed. (Patient not taking: Reported on 07/30/2015)    FAMILY HISTORY:  His has no family status information on file.   SOCIAL HISTORY: He  reports that he has been smoking Cigarettes.  He has been smoking about 0.10 packs per day. He does not have any smokeless tobacco history on file. He reports that he drinks alcohol. He reports that he uses illicit drugs (Marijuana).  REVIEW OF SYSTEMS:   Unable to obtain as pt is encephalopathic.  SUBJECTIVE:  On vent, unresponsive.  VITAL SIGNS: BP 151/113 mmHg  Pulse 147  Temp(Src) 104 F (40 C) (Oral)  Resp 16  Ht 5' 8.9" (1.75 m)  Wt 47.628 kg (105 lb)  BMI 15.55 kg/m2  SpO2 96%  HEMODYNAMICS:    VENTILATOR SETTINGS: Vent Mode:  [-] PRVC FiO2 (%):  [100 %] 100 % Set Rate:  [16 bmp] 16 bmp Vt Set:  [600 mL] 600 mL PEEP:  [5 cmH20] 5  cmH20 Plateau Pressure:  [18 cmH20] 18 cmH20  INTAKE / OUTPUT:     PHYSICAL EXAMINATION: General: Adult male, disheveled, in NAD. Neuro: Sedated, non-responsive. HEENT: [place]/AT. PERRL, sclerae anicteric. Cardiovascular: Tachy, regular, no M/R/G.  Lungs: Respirations even and unlabored.  Coarse bilaterally. Abdomen: BS x 4, soft, NT/ND.  Musculoskeletal: No gross deformities, no edema.  Skin: Intact, dry, warm, no rashes.  LABS:  BMET  Recent Labs Lab 02/13/16 1753 02/16/16 1200  NA 127* 131*  K 3.7 4.1  CL 96* 93*  CO2 18* 19*  BUN 27* 17  CREATININE 1.84* 1.66*  GLUCOSE 100* 132*    Electrolytes  Recent Labs Lab 02/13/16 1753 02/16/16 1200  CALCIUM 7.7* 9.0     CBC  Recent Labs Lab 02/13/16 1753 02/16/16 1200  WBC 13.3* 18.4*  HGB 12.6* 13.9  HCT 34.9* 37.9*  PLT 147* 155    Coag's No results for input(s): APTT, INR in the last 168 hours.  Sepsis Markers  Recent Labs Lab 02/13/16 2016 02/16/16 1209 02/16/16 1717  LATICACIDVEN 1.1 3.45* 2.11*    ABG No results for input(s): PHART, PCO2ART, PO2ART in the last 168 hours.  Liver Enzymes  Recent Labs Lab 02/16/16 1200  AST 117*  ALT 42  ALKPHOS 56  BILITOT 1.1  ALBUMIN 2.9*    Cardiac Enzymes  Recent Labs Lab 02/13/16 1753  TROPONINI <0.03    Glucose  Recent Labs Lab 02/16/16 1126  GLUCAP 134*    Imaging Ct Head Wo Contrast  02/16/2016  CLINICAL DATA:  Fever and altered mental status; tremors EXAM: CT HEAD WITHOUT CONTRAST TECHNIQUE: Contiguous axial images were obtained from the base of the skull through the vertex without intravenous contrast. COMPARISON:  June 01, 2009 FINDINGS: The ventricles are mildly enlarged in a generalized manner and appear larger compared to the prior study. The sulci appear unremarkable. There is no intracranial mass, hemorrhage, extra-axial fluid collection, or midline shift. No focal gray-white compartment lesions are identified. No evidence of acute infarct. Bony calvarium appears intact. The mastoid air cells are clear. There is opacification in the left maxillary antrum as well as in multiple ethmoid air cells bilaterally. There is mild mucosal thickening in the left sphenoid sinus region. No intraorbital lesions are apparent. IMPRESSION: Ventricles mildly enlarged with sulci appearing normal. This appearance potentially could represent a degree of normal pressure hydrocephalus. This finding also could be secondary to prior or recent meningitis meningitis. No intracranial mass, hemorrhage, or extra-axial fluid collection. No acute infarct evident. Multifocal sinusitis. Given these current findings, brain MRI pre and post-contrast  may be helpful to further assess, in particular to further evaluate the meningeal regions. Electronically Signed   By: Bretta Bang III M.D.   On: 02/16/2016 14:04   Dg Chest Port 1 View  02/16/2016  CLINICAL DATA:  Altered mental status.  Depression. EXAM: PORTABLE CHEST 1 VIEW COMPARISON:  02/13/2016. FINDINGS: Midline trachea. Normal heart size and mediastinal contours. Left costophrenic angle exclusion. No pleural effusion or pneumothorax. Clear lungs. IMPRESSION: No active disease. Electronically Signed   By: Jeronimo Greaves M.D.   On: 02/16/2016 13:48     STUDIES:  CXR 03/14 > central vascular congestion. CT head 03/14 > mildly enlarged ventricles with normal sulci.  Could potentially represent NPH or meningitis.  Multifocal sinusitis.  CULTURES: Blood 03/14 > Urine 03/14 > CSF 03/14 > 340 WBC's (88 neut), < 20 glucose, GPC's in pairs >   ANTIBIOTICS: Vanc 03/14 > Ceftriaxone 03/14 > Ampicillin  03/14 >  SIGNIFICANT EVENTS: 03/14 > admitted with meningitis.  Required intubation for airway protection due to decline in mental status.  LINES/TUBES: ETT 03/14 >  DISCUSSION: 50 y.o. M admitted 03/14 for AMS secondary to meningitis.  He required intubation in ED for decline in mental status.  ASSESSMENT / PLAN:  NEUROLOGIC A:   Acute metabolic encephalopathy - presumed due to meningitis.  S/p LP in ED 03/14. ETOH use disorder. Polysubstance abuse - UDS positive for THC. P:   Sedation:  Propofol gtt / Fentanyl PRN. RASS goal: 0 to -1. Daily WUA. Thiamine / Folate. Substance abuse counseling once extubated.  PULMONARY A: VDRF - due to inability to protect airway in the setting of worsening mental status. Tobacco use disorder. P:   Full vent support. Wean as able. VAP prevention measures. SBT in AM if able. Albuterol PRN. CXR in AM. Tobacco cessation counseling once extubated.  CARDIOVASCULAR A:  Sinus tachycardia - likely combination of sepsis + agitation  post intubation. P:  Sedation per neuro section. Monitor hemodynamics. Repeat lactate.  RENAL A:   Hyponatremia - likely due to ETOH. AKI. AGMA - lactate. P:   NS @ 100. BMP in AM.  GASTROINTESTINAL A:   GI prophylaxis. Nutrition. P:   SUP: Pantoprazole. NPO.  HEMATOLOGIC A:   VTE Prophylaxis. P:  SCD's / Heparin. CBC in AM.  INFECTIOUS A:   Meningitis - presumed bacterial given preliminary CSF studies. Sinusitis - as seen on CT head. Trichomonas - s/p treatment with flagyl on prior ED visit 03/11. P:   Abx as above (vanc / ampicillin / ceftriaxone).  Follow cultures as above. PCT algorithm to limit abx exposure. Assess RPR, HIV. [facility] - will formally see in AM 03/15.  ENDOCRINE A:   No acute issues.   P:   Monitor glucose on BMP.  Family updated: None.  Interdisciplinary Family Meeting v Palliative Care Meeting:  Due by: 03/20.  CC time: 40 minutes.   Rutherford Guysahul Carmeline Kowal, [place] - [facility] Pager: [phone]  or [phone] 02/16/2016, 7:36 PM

## 2016-02-16 NOTE — Progress Notes (Addendum)
Pharmacy Antibiotic Note  Dale DemarkGraig D [name] is a 50 y.o. male admitted on 02/16/2016 with UTI.  Pharmacy has been consulted for ceftriaxone dosing. WBC 13.3, LA 1.1  ADDENDUM: code sepsis called at 1141. Will bump up CTX dose and f/u reducing dose when sepsis resolves  Plan: CTX 2g IV q24h F/u reduce dose when sepsis resolves Monitor clinical progress, c/s, LOT Not renally adjusted - Rx will sign off consult     No data recorded.   Recent Labs Lab 02/13/16 1753 02/13/16 2016  WBC 13.3*  --   CREATININE 1.84*  --   LATICACIDVEN  --  1.1    CrCl cannot be calculated (Unknown ideal weight.).    No Known Allergies  Antimicrobials this admission: 3/14 CTX >>   Microbiology results: 3/14 BCx:  3/14 UCx:    Dale [name], PharmD, BCPS Clinical Pharmacist Pager [phone] 02/16/2016 11:35 AM   Pharmacy Code Sepsis Protocol  Time of code sepsis page: 1141 [x]  Antibiotics delivered at: 1145  Were antibiotics ordered at the time of the code sepsis page? Yes Was it required to contact the physician? [x]  Physician not contacted []  Physician contacted to order antibiotics for code sepsis []  Physician contacted to recommend changing antibiotics  Pharmacy consulted for: ceftriaxone  Anti-infectives    Start     Dose/Rate Route Frequency Ordered Stop   02/17/16 1200  cefTRIAXone (ROCEPHIN) 1 g in dextrose 5 % 50 mL IVPB  Status:  Discontinued     1 g 100 mL/hr over 30 Minutes Intravenous Every 24 hours 02/16/16 1143 02/16/16 1144   02/17/16 1200  cefTRIAXone (ROCEPHIN) 2 g in dextrose 5 % 50 mL IVPB     2 g 100 mL/hr over 30 Minutes Intravenous Every 24 hours 02/16/16 1144     02/16/16 1145  cefTRIAXone (ROCEPHIN) 2 g in dextrose 5 % 50 mL IVPB  Status:  Discontinued     2 g 100 mL/hr over 30 Minutes Intravenous  Once 02/16/16 1130 02/16/16 1136   02/16/16 1145  cefTRIAXone (ROCEPHIN) 1 g in dextrose 5 % 50 mL IVPB  Status:  Discontinued     1 g 100 mL/hr over 30 Minutes  Intravenous Every 24 hours 02/16/16 1136 02/16/16 1143   02/16/16 1145  cefTRIAXone (ROCEPHIN) 2 g in dextrose 5 % 50 mL IVPB     2 g 100 mL/hr over 30 Minutes Intravenous  Once 02/16/16 1143          Nurse education provided: [x]  Minutes left to administer antibiotics to achieve 1 hour goal [x]  Correct order of antibiotic administration [x]  Antibiotic Y-site compatibilities     Dale [name], PharmD, BCPS Clinical Pharmacist Pager [phone] 02/16/2016 11:44 AM

## 2016-02-16 NOTE — Progress Notes (Signed)
Increased PEEP to 10 due to sats 83-85% on 100% FIO2. Will collect ABG at 2020.

## 2016-02-16 NOTE — ED Provider Notes (Signed)
CSN: [id number]     Arrival date & time 02/16/16  1108 History   First MD Initiated Contact with Patient 02/16/16 1109     Chief Complaint  Patient presents with  . Altered Mental Status     (Consider location/radiation/quality/duration/timing/severity/associated sxs/prior Treatment) Patient is a 50 y.o. male presenting with altered mental status. The history is provided by medical records and the EMS personnel. The history is limited by the condition of the patient. No language interpreter was used.  Altered Mental Status  Dale [name] is a 50 y.o. male  with PMH of depression who presents to the Emergency Department via EMS for altered mental status. Per EMS, family came to patient's home and he seemed confused. Seen in ED on 3/11 with URI symptoms, given Tamiflu but has not been filled. UA at that time with 6-30 WBC's, + leuks, and trich present in urine. Patient was treated for trich with Flagyl. Urine today appears improved.   Level 5 caveat applies due to acuity of conditions.    Past Medical History  Diagnosis Date  . Depression    Past Surgical History  Procedure Laterality Date  . Ankle surgery     No family history on file. Social History  Substance Use Topics  . Smoking status: Current Every Day Smoker -- 0.10 packs/day    Types: Cigarettes  . Smokeless tobacco: None  . Alcohol Use: Yes    Review of Systems  Unable to perform ROS: Acuity of condition      Allergies  Review of patient's allergies indicates no known allergies.  Home Medications   Prior to Admission medications   Medication Sig Start Date End Date Taking? Authorizing Provider  Omega-3 Fatty Acids (FISH OIL) 1000 MG CAPS Take 1,000 mg by mouth every Friday.   Yes Historical Provider, MD  methocarbamol (ROBAXIN) 500 MG tablet Take 1 tablet (500 mg total) by mouth 2 (two) times daily. Patient not taking: Reported on 07/30/2015 08/25/14   Tatyana Kirichenko, PA-C  naproxen (NAPROSYN) 500 MG tablet  Take 1 tablet (500 mg total) by mouth 2 (two) times daily. Patient not taking: Reported on 07/30/2015 08/25/14   Jaynie Crumble, PA-C  oseltamivir (TAMIFLU) 75 MG capsule Take 1 capsule (75 mg total) by mouth every 12 (twelve) hours. 02/13/16   Melton Krebs, PA-C  traMADol (ULTRAM) 50 MG tablet Take 1 tablet (50 mg total) by mouth every 6 (six) hours as needed. Patient not taking: Reported on 07/30/2015 08/25/14   Tatyana Kirichenko, PA-C   BP 122/82 mmHg  Pulse 109  Temp(Src) 104.6 F (40.3 C) (Rectal)  Resp 29  Ht 5' 8.9" (1.75 m)  Wt 47.628 kg  BMI 15.55 kg/m2  SpO2 94% Physical Exam  Constitutional:  Ill-appearing   HENT:  Head: Normocephalic and atraumatic.  Dry cracked lips.   Eyes: Pupils are equal, round, and reactive to light.  Neck:  Nuchal rigidity.   Cardiovascular: Normal heart sounds.  Exam reveals no gallop and no friction rub.   No murmur heard. Tachycardia but regular.  Pulmonary/Chest: Effort normal and breath sounds normal. No respiratory distress.  Abdominal: Soft. Bowel sounds are normal.  Neurological:  Will follow occasional commands, but not responding to questions. Moving all extremities.   Skin:  Dry, ashy skin.   Nursing note and vitals reviewed.   ED Course  Procedures (including critical care time)  CRITICAL CARE Performed by: Chase Picket Ward   Total critical care time: 35 minutes  Critical care  time was exclusive of separately billable procedures and treating other patients.  Critical care was necessary to treat or prevent imminent or life-threatening deterioration.  Critical care was time spent personally by me on the following activities: development of treatment plan with patient and/or surrogate as well as nursing, discussions with consultants, evaluation of patient's response to treatment, examination of patient, obtaining history from patient or surrogate, ordering and performing treatments and interventions, ordering and  review of laboratory studies, ordering and review of radiographic studies, pulse oximetry and re-evaluation of patient's condition.  Labs Review Labs Reviewed  COMPREHENSIVE METABOLIC PANEL - Abnormal; Notable for the following:    Sodium 131 (*)    Chloride 93 (*)    CO2 19 (*)    Glucose, Bld 132 (*)    Creatinine, Ser 1.66 (*)    Albumin 2.9 (*)    AST 117 (*)    GFR calc non Af Amer 46 (*)    GFR calc Af Amer 54 (*)    Anion gap 19 (*)    All other components within normal limits  CBC WITH DIFFERENTIAL/PLATELET - Abnormal; Notable for the following:    WBC 18.4 (*)    HCT 37.9 (*)    MCHC 36.7 (*)    Neutro Abs 14.5 (*)    Monocytes Absolute 2.9 (*)    All other components within normal limits  URINALYSIS, ROUTINE W REFLEX MICROSCOPIC (NOT AT [facility]) - Abnormal; Notable for the following:    APPearance CLOUDY (*)    Hgb urine dipstick LARGE (*)    Ketones, ur 15 (*)    Protein, ur 100 (*)    Leukocytes, UA TRACE (*)    All other components within normal limits  URINE RAPID DRUG SCREEN, HOSP PERFORMED - Abnormal; Notable for the following:    Tetrahydrocannabinol POSITIVE (*)    All other components within normal limits  URINE MICROSCOPIC-ADD ON - Abnormal; Notable for the following:    Squamous Epithelial / LPF 0-5 (*)    Bacteria, UA FEW (*)    Casts GRANULAR CAST (*)    All other components within normal limits  I-STAT CG4 LACTIC ACID, ED - Abnormal; Notable for the following:    Lactic Acid, Venous 3.45 (*)    All other components within normal limits  CBG MONITORING, ED - Abnormal; Notable for the following:    Glucose-Capillary 134 (*)    All other components within normal limits  CULTURE, BLOOD (ROUTINE X 2)  CULTURE, BLOOD (ROUTINE X 2)  URINE CULTURE  CSF CULTURE  INFLUENZA VIRUS AG, A+B (DFA)  ETHANOL  RPR  HIV ANTIBODY (ROUTINE TESTING)  CSF CELL COUNT WITH DIFFERENTIAL  CSF CELL COUNT WITH DIFFERENTIAL  GLUCOSE, CSF  PROTEIN, CSF  I-STAT CG4 LACTIC  ACID, ED    Imaging Review Ct Head Wo Contrast  02/16/2016  CLINICAL DATA:  Fever and altered mental status; tremors EXAM: CT HEAD WITHOUT CONTRAST TECHNIQUE: Contiguous axial images were obtained from the base of the skull through the vertex without intravenous contrast. COMPARISON:  June 01, 2009 FINDINGS: The ventricles are mildly enlarged in a generalized manner and appear larger compared to the prior study. The sulci appear unremarkable. There is no intracranial mass, hemorrhage, extra-axial fluid collection, or midline shift. No focal gray-white compartment lesions are identified. No evidence of acute infarct. Bony calvarium appears intact. The mastoid air cells are clear. There is opacification in the left maxillary antrum as well as in multiple ethmoid air cells  bilaterally. There is mild mucosal thickening in the left sphenoid sinus region. No intraorbital lesions are apparent. IMPRESSION: Ventricles mildly enlarged with sulci appearing normal. This appearance potentially could represent a degree of normal pressure hydrocephalus. This finding also could be secondary to prior or recent meningitis meningitis. No intracranial mass, hemorrhage, or extra-axial fluid collection. No acute infarct evident. Multifocal sinusitis. Given these current findings, brain MRI pre and post-contrast may be helpful to further assess, in particular to further evaluate the meningeal regions. Electronically Signed   By: Bretta Bang III M.D.   On: 02/16/2016 14:04   Dg Chest Port 1 View  02/16/2016  CLINICAL DATA:  Altered mental status.  Depression. EXAM: PORTABLE CHEST 1 VIEW COMPARISON:  02/13/2016. FINDINGS: Midline trachea. Normal heart size and mediastinal contours. Left costophrenic angle exclusion. No pleural effusion or pneumothorax. Clear lungs. IMPRESSION: No active disease. Electronically Signed   By: Jeronimo Greaves M.D.   On: 02/16/2016 13:48   I have personally reviewed and evaluated these images and  lab results as part of my medical decision-making.   EKG Interpretation None      MDM   Final diagnoses:  None   Dale [name] presents via EMS for altered mental status. Upon arrival rectal temperature was 103 with heart rate of 115 and respirations of 22. Chart was reviewed- patient seen on February 14, 2015 at which time UA was obtained showing 6-30 WBC, large leuks, and trichomonas. Patient was given flagyl for trich. Also had sxs of URI/flu at that time and was started on tamiflu. Per family, patient did not start taking tamiflu.  Code sepsis protocol was initiated. Given white cells in urine 2 days ago with altered mental status today, urinary source suspected and Rocephin per pharmacy consult started. CBC with white count of 18.4. UA resulted showing 6-30 WBC and few bacteria making urinary source less likely. CT head shows mildly enlarged ventricles which could be 2/2 meningitis. Vanc and ampicillin added to ABX regimen. LP was performed in ED by Dr. Dalene Seltzer. Consult to hospitalist who will admit for further evaluation and management.   Patient seen by and discussed with Dr. Dalene Seltzer who agrees with treatment plan.   [facility], PA-C 02/16/16 1655  Alvira Monday, MD 02/17/16 1220

## 2016-02-16 NOTE — ED Notes (Signed)
Ice packs applied to axilla and groin until cooling blanket can be applied

## 2016-02-16 NOTE — Consult Note (Addendum)
Neurology Consultation Reason for Consult: possible meningitis - called at 10:25PM Referring Physician: pulmonary critical care  CC: altered mental status and abnormal head ct/possible meningitis        HPI: Dale [name] is a 50 y.o. male History is obtained from chart son in room but unable to provide history.  He however denied pt has had any prior IVDU.   Per notes: "pt is homeless with poor medical follow-up and presented to the emergency room on 3/11 with upper respiratory complaints. At that time he was evaluated with consideration for flu and given prescription for Tamiflu. Flu titer not done. In addition, noted to have Trichomonas and treated with Flagyl. Patient apparently has estranged family who live in town and went to see them today when he was feeling worse. Family the patient was confused and brought him into the emergency room. The emergency room, patient noted to have white count of 18, lactic acid level of 3 and acute kidney injury creatinine of 1.8. CT scan done noted enlarged ventricles and multifocal sinusitis. Concerns for meningitis. Patient underwent lumbar puncture by emergency room which yielded approximately 300 white blood cells and Gram stain came back soon positive for gram-positive cocci. Hospitalist called for evaluation and admission the patient. At that time patient was stable in terms of respiratory standpoint. When I walked into the room to see patient, patient is foaming at mouth and otherwise open, completely unresponsive including to painful stimuli. Difficult to get good oxygen saturation read. Decision made to intubate patient. Spoke with critical care who've accepted patient to ICU. Spoke with infectious disease will come see patient."    ROS: Unable to obtain due to altered mental status.   Past Medical History  Diagnosis Date  . Depression    Unable to obtain  No family history on file. Unable to obtain  Social History:  reports that he has been  smoking Cigarettes.  He has been smoking about 0.10 packs per day. He does not have any smokeless tobacco history on file. He reports that he drinks alcohol. He reports that he uses illicit drugs (Marijuana). Unable to obtain  Exam: Current vital signs: BP 101/80 mmHg  Pulse 160  Temp(Src) 104.7 F (40.4 C) (Core (Comment))  Resp 26  Ht 5' 8.9" (1.75 m)  Wt 47.628 kg (105 lb)  BMI 15.55 kg/m2  SpO2 99% Vital signs in last 24 hours: Temp:  [104 F (40 C)-105.8 F (41 C)] 104.7 F (40.4 C) (03/14 2215) Pulse Rate:  [62-160] 160 (03/14 2200) Resp:  [10-35] 26 (03/14 2215) BP: (101-175)/(70-118) 101/80 mmHg (03/14 2215) SpO2:  [80 %-100 %] 99 % (03/14 2200) FiO2 (%):  [100 %] 100 % (03/14 1950) Weight:  [47.628 kg (105 lb)] 47.628 kg (105 lb) (03/14 1400)   Physical Exam  Constitutional: Thin Eyes: No scleral injection HENT: No OP obstrucion Head: Normocephalic. Neck is supple Cardiovascular: tachycardic Respiratory: intubated and sedated GI: Soft.  No distension. There is no tenderness.  Skin: WDI  Neuro: Mental Status: Patient sedated deeply and does not open eyes to command or follow commands Cranial Nerves: II: Visual Fields unable to test III,IV, VI: EOM are intact on OC maneuver V: unable to test VII: Facial movement unable to test VIII: unable to test X: Uvula unable to test XI: Shoulderunable to test XII: tongue is unable to test Tone is normal. He does not wiothdraw to pain. No shaking or jerking observed. Sensory: Sensation is unable to test Deep Tendon Reflexes:  1+ Plantars: Toes are silent no clonus Cerebellar: unable to test    I have reviewed labs in epic and the results pertinent to this consultation are: CSF elevated and WBC > 300 - elevated WBC on peripheral blood count  I have reviewed the images obtained:CT brain - hydro seen and neurosurgery aware  Impression: bacterial meningoencephalitis.  Also appears to have hydrocephalus.   Antibiotics already being adressed by ID.  MRI brain when possible; NSG aware of hydrocephalus; frequent neuro checks in ICU.  cEEG monitoring STAT please call me when pateint is hooked up so I can come to r/o status epileptics.  This EEG is to be offocially read by day team. Aggressive fever control.  Recommendations: 1) as above

## 2016-02-17 ENCOUNTER — Inpatient Hospital Stay (HOSPITAL_COMMUNITY): Payer: Medicaid Other

## 2016-02-17 ENCOUNTER — Encounter (HOSPITAL_COMMUNITY)
Admission: EM | Disposition: A | Discharge status: Skilled Nursing Facility | Payer: Self-pay | Source: Home / Self Care | Attending: Pulmonary Disease

## 2016-02-17 ENCOUNTER — Ambulatory Visit (HOSPITAL_COMMUNITY): Payer: Medicaid Other

## 2016-02-17 DIAGNOSIS — I469 Cardiac arrest, cause unspecified: Secondary | ICD-10-CM | POA: Insufficient documentation

## 2016-02-17 DIAGNOSIS — I509 Heart failure, unspecified: Secondary | ICD-10-CM | POA: Insufficient documentation

## 2016-02-17 DIAGNOSIS — J9601 Acute respiratory failure with hypoxia: Secondary | ICD-10-CM

## 2016-02-17 DIAGNOSIS — R4182 Altered mental status, unspecified: Secondary | ICD-10-CM | POA: Insufficient documentation

## 2016-02-17 DIAGNOSIS — G919 Hydrocephalus, unspecified: Secondary | ICD-10-CM | POA: Insufficient documentation

## 2016-02-17 DIAGNOSIS — Z9981 Dependence on supplemental oxygen: Secondary | ICD-10-CM

## 2016-02-17 DIAGNOSIS — R6521 Severe sepsis with septic shock: Secondary | ICD-10-CM

## 2016-02-17 DIAGNOSIS — R570 Cardiogenic shock: Secondary | ICD-10-CM | POA: Insufficient documentation

## 2016-02-17 DIAGNOSIS — A419 Sepsis, unspecified organism: Secondary | ICD-10-CM | POA: Diagnosis present

## 2016-02-17 DIAGNOSIS — Z590 Homelessness: Secondary | ICD-10-CM

## 2016-02-17 DIAGNOSIS — B955 Unspecified streptococcus as the cause of diseases classified elsewhere: Secondary | ICD-10-CM

## 2016-02-17 DIAGNOSIS — N179 Acute kidney failure, unspecified: Secondary | ICD-10-CM

## 2016-02-17 DIAGNOSIS — I5021 Acute systolic (congestive) heart failure: Secondary | ICD-10-CM

## 2016-02-17 HISTORY — PX: CARDIAC CATHETERIZATION: SHX172

## 2016-02-17 LAB — CARBOXYHEMOGLOBIN
CARBOXYHEMOGLOBIN: 1 % (ref 0.5–1.5)
CARBOXYHEMOGLOBIN: 0.8 % (ref 0.5–1.5)
Carboxyhemoglobin: 0.3 % — ABNORMAL LOW (ref 0.5–1.5)
Methemoglobin: 1.1 % (ref 0.0–1.5)
Methemoglobin: 0.8 % (ref 0.0–1.5)
Methemoglobin: 1 % (ref 0.0–1.5)
O2 Saturation: 97 %
O2 Saturation: 20.2 %
O2 Saturation: 40.2 %
Total hemoglobin: 11.4 g/dL — ABNORMAL LOW (ref 13.5–18.0)
Total hemoglobin: 12.9 g/dL — ABNORMAL LOW (ref 13.5–18.0)
Total hemoglobin: 12 g/dL — ABNORMAL LOW (ref 13.5–18.0)

## 2016-02-17 LAB — BASIC METABOLIC PANEL
Anion gap: 14 (ref 5–15)
Anion gap: 15 (ref 5–15)
BUN: 22 mg/dL — AB (ref 6–20)
BUN: 28 mg/dL — ABNORMAL HIGH (ref 6–20)
CALCIUM: 6.2 mg/dL — AB (ref 8.9–10.3)
CO2: 14 mmol/L — ABNORMAL LOW (ref 22–32)
CO2: 20 mmol/L — ABNORMAL LOW (ref 22–32)
CREATININE: 1.65 mg/dL — AB (ref 0.61–1.24)
CREATININE: 1.82 mg/dL — AB (ref 0.61–1.24)
Calcium: 6.5 mg/dL — ABNORMAL LOW (ref 8.9–10.3)
Chloride: 106 mmol/L (ref 101–111)
Chloride: 102 mmol/L (ref 101–111)
GFR calc Af Amer: 54 mL/min — ABNORMAL LOW (ref >60)
GFR, EST AFRICAN AMERICAN: 48 mL/min — AB (ref >60)
GFR, EST NON AFRICAN AMERICAN: 47 mL/min — AB (ref >60)
GFR, EST NON AFRICAN AMERICAN: 41 mL/min — AB (ref >60)
GLUCOSE: 309 mg/dL — AB (ref 65–99)
Glucose, Bld: 248 mg/dL — ABNORMAL HIGH (ref 65–99)
POTASSIUM: 4.4 mmol/L (ref 3.5–5.1)
Potassium: 3.7 mmol/L (ref 3.5–5.1)
SODIUM: 134 mmol/L — AB (ref 135–145)
SODIUM: 137 mmol/L (ref 135–145)

## 2016-02-17 LAB — BLOOD GAS, ARTERIAL
ACID-BASE DEFICIT: 11.2 mmol/L — AB (ref 0.0–2.0)
Bicarbonate: 12.9 mEq/L — ABNORMAL LOW (ref 20.0–24.0)
Drawn by: 437071
FIO2: 1
LHR: 16 {breaths}/min
O2 Saturation: 96.6 %
PEEP/CPAP: 10 cmH2O
PH ART: 7.383 (ref 7.350–7.450)
Patient temperature: 98.6
TCO2: 13.6 mmol/L (ref 0–100)
VT: 600 mL
pCO2 arterial: 22.2 mmHg — ABNORMAL LOW (ref 35.0–45.0)
pO2, Arterial: 98.4 mmHg (ref 80.0–100.0)

## 2016-02-17 LAB — COMPREHENSIVE METABOLIC PANEL
ALK PHOS: 39 U/L (ref 38–126)
ALT: 45 U/L (ref 17–63)
ANION GAP: 18 — AB (ref 5–15)
AST: 174 U/L — ABNORMAL HIGH (ref 15–41)
Albumin: 1.7 g/dL — ABNORMAL LOW (ref 3.5–5.0)
BILIRUBIN TOTAL: 1.7 mg/dL — AB (ref 0.3–1.2)
BUN: 16 mg/dL (ref 6–20)
CALCIUM: 6.5 mg/dL — AB (ref 8.9–10.3)
CO2: 17 mmol/L — ABNORMAL LOW (ref 22–32)
CREATININE: 1.86 mg/dL — AB (ref 0.61–1.24)
Chloride: 106 mmol/L (ref 101–111)
GFR calc non Af Amer: 40 mL/min — ABNORMAL LOW (ref >60)
GFR, EST AFRICAN AMERICAN: 47 mL/min — AB (ref >60)
Glucose, Bld: 145 mg/dL — ABNORMAL HIGH (ref 65–99)
Potassium: 3 mmol/L — ABNORMAL LOW (ref 3.5–5.1)
Sodium: 141 mmol/L (ref 135–145)
TOTAL PROTEIN: 5 g/dL — AB (ref 6.5–8.1)

## 2016-02-17 LAB — CBC
HCT: 32.6 % — ABNORMAL LOW (ref 39.0–52.0)
HCT: 33.6 % — ABNORMAL LOW (ref 39.0–52.0)
Hemoglobin: 11 g/dL — ABNORMAL LOW (ref 13.0–17.0)
Hemoglobin: 12 g/dL — ABNORMAL LOW (ref 13.0–17.0)
MCH: 30.1 pg (ref 26.0–34.0)
MCH: 31.6 pg (ref 26.0–34.0)
MCHC: 33.7 g/dL (ref 30.0–36.0)
MCHC: 35.7 g/dL (ref 30.0–36.0)
MCV: 88.4 fL (ref 78.0–100.0)
MCV: 89.1 fL (ref 78.0–100.0)
PLATELETS: 120 10*3/uL — AB (ref 150–400)
PLATELETS: 136 10*3/uL — AB (ref 150–400)
RBC: 3.66 MIL/uL — ABNORMAL LOW (ref 4.22–5.81)
RBC: 3.8 MIL/uL — AB (ref 4.22–5.81)
RDW: 13.1 % (ref 11.5–15.5)
RDW: 13.4 % (ref 11.5–15.5)
WBC: 15.4 10*3/uL — AB (ref 4.0–10.5)
WBC: 19.7 10*3/uL — ABNORMAL HIGH (ref 4.0–10.5)

## 2016-02-17 LAB — POCT I-STAT 3, ART BLOOD GAS (G3+)
ACID-BASE DEFICIT: 12 mmol/L — AB (ref 0.0–2.0)
ACID-BASE DEFICIT: 12 mmol/L — AB (ref 0.0–2.0)
ACID-BASE DEFICIT: 13 mmol/L — AB (ref 0.0–2.0)
Acid-base deficit: 13 mmol/L — ABNORMAL HIGH (ref 0.0–2.0)
Acid-base deficit: 6 mmol/L — ABNORMAL HIGH (ref 0.0–2.0)
BICARBONATE: 16.7 meq/L — AB (ref 20.0–24.0)
Bicarbonate: 11.9 mEq/L — ABNORMAL LOW (ref 20.0–24.0)
Bicarbonate: 12.2 mEq/L — ABNORMAL LOW (ref 20.0–24.0)
Bicarbonate: 13.5 mEq/L — ABNORMAL LOW (ref 20.0–24.0)
Bicarbonate: 13.8 mEq/L — ABNORMAL LOW (ref 20.0–24.0)
O2 SAT: 79 %
O2 SAT: 83 %
O2 SAT: 95 %
O2 Saturation: 78 %
O2 Saturation: 99 %
PCO2 ART: 30.7 mmHg — AB (ref 35.0–45.0)
PCO2 ART: 31.6 mmHg — AB (ref 35.0–45.0)
PH ART: 7.265 — AB (ref 7.350–7.450)
PH ART: 7.327 — AB (ref 7.350–7.450)
PO2 ART: 47 mmHg — AB (ref 80.0–100.0)
Patient temperature: 98.6
TCO2: 13 mmol/L (ref 0–100)
TCO2: 13 mmol/L (ref 0–100)
TCO2: 14 mmol/L (ref 0–100)
TCO2: 15 mmol/L (ref 0–100)
TCO2: 17 mmol/L (ref 0–100)
pCO2 arterial: 22.9 mmHg — ABNORMAL LOW (ref 35.0–45.0)
pCO2 arterial: 26.4 mmHg — ABNORMAL LOW (ref 35.0–45.0)
pCO2 arterial: 21.6 mmHg — ABNORMAL LOW (ref 35.0–45.0)
pH, Arterial: 7.272 — ABNORMAL LOW (ref 7.350–7.450)
pH, Arterial: 7.238 — ABNORMAL LOW (ref 7.350–7.450)
pH, Arterial: 7.483 — ABNORMAL HIGH (ref 7.350–7.450)
pO2, Arterial: 48 mmHg — ABNORMAL LOW (ref 80.0–100.0)
pO2, Arterial: 55 mmHg — ABNORMAL LOW (ref 80.0–100.0)
pO2, Arterial: 91 mmHg (ref 80.0–100.0)
pO2, Arterial: 96 mmHg (ref 80.0–100.0)

## 2016-02-17 LAB — TROPONIN I
TROPONIN I: 13.02 ng/mL — AB (ref <0.031)
TROPONIN I: 4.74 ng/mL — AB (ref <0.031)
Troponin I: 6.43 ng/mL (ref <0.031)

## 2016-02-17 LAB — CK
CK TOTAL: 645 U/L — AB (ref 49–397)
Total CK: 540 U/L — ABNORMAL HIGH (ref 49–397)

## 2016-02-17 LAB — HIV ANTIBODY (ROUTINE TESTING W REFLEX): HIV SCREEN 4TH GENERATION: NONREACTIVE

## 2016-02-17 LAB — TYPE AND SCREEN
ABO/RH(D): B POS
ANTIBODY SCREEN: NEGATIVE

## 2016-02-17 LAB — GLUCOSE, CAPILLARY
Glucose-Capillary: 304 mg/dL — ABNORMAL HIGH (ref 65–99)
Glucose-Capillary: 302 mg/dL — ABNORMAL HIGH (ref 65–99)

## 2016-02-17 LAB — PHOSPHORUS
PHOSPHORUS: 2.7 mg/dL (ref 2.5–4.6)
Phosphorus: 4.6 mg/dL (ref 2.5–4.6)
Phosphorus: 3.4 mg/dL (ref 2.5–4.6)

## 2016-02-17 LAB — RPR: RPR: REACTIVE — AB

## 2016-02-17 LAB — HEPARIN LEVEL (UNFRACTIONATED): Heparin Unfractionated: <0.1 IU/mL — ABNORMAL LOW (ref 0.30–0.70)

## 2016-02-17 LAB — PATHOLOGIST SMEAR REVIEW

## 2016-02-17 LAB — LACTIC ACID, PLASMA: Lactic Acid, Venous: 4.9 mmol/L (ref 0.5–2.0)

## 2016-02-17 LAB — URINE CULTURE: Culture: NO GROWTH

## 2016-02-17 LAB — MAGNESIUM
MAGNESIUM: 2 mg/dL (ref 1.7–2.4)
Magnesium: 2.2 mg/dL (ref 1.7–2.4)
Magnesium: 2.1 mg/dL (ref 1.7–2.4)

## 2016-02-17 LAB — PROTIME-INR
INR: 1.14 (ref 0.00–1.49)
Prothrombin Time: 14.8 seconds (ref 11.6–15.2)

## 2016-02-17 LAB — PROCALCITONIN: Procalcitonin: 73.53 ng/mL

## 2016-02-17 LAB — RPR, QUANT+TP ABS (REFLEX)
Rapid Plasma Reagin, Quant: 1:2 {titer} — ABNORMAL HIGH
TREPONEMA PALLIDUM AB: POSITIVE — AB

## 2016-02-17 LAB — MRSA PCR SCREENING: MRSA BY PCR: NEGATIVE

## 2016-02-17 LAB — ABO/RH: ABO/RH(D): B POS

## 2016-02-17 LAB — APTT: APTT: 49 s — AB (ref 24–37)

## 2016-02-17 LAB — CORTISOL: CORTISOL PLASMA: 15.2 ug/dL

## 2016-02-17 SURGERY — IABP INSERTION
Anesthesia: LOCAL

## 2016-02-17 MED ORDER — MIDAZOLAM HCL 2 MG/2ML IJ SOLN: 2.0000 mg | INTRAMUSCULAR | Status: DC | PRN

## 2016-02-17 MED ORDER — SODIUM CHLORIDE 0.9% FLUSH: 10.0000 mL | INTRAVENOUS | Status: DC | PRN

## 2016-02-17 MED ORDER — HEPARIN (PORCINE) IN NACL 2-0.9 UNIT/ML-% IJ SOLN
INTRAMUSCULAR | Status: DC | PRN
  Administered 2016-02-17: 1000 mL

## 2016-02-17 MED ORDER — SODIUM BICARBONATE 8.4 % IV SOLN
INTRAVENOUS | Status: AC
  Administered 2016-02-17: 100 meq
  Filled 2016-02-17: qty 100

## 2016-02-17 MED ORDER — POTASSIUM CHLORIDE CRYS ER 20 MEQ PO TBCR
40.0000 meq | EXTENDED_RELEASE_TABLET | Freq: Once | ORAL | Status: DC
  Filled 2016-02-17: qty 2

## 2016-02-17 MED ORDER — AMIODARONE HCL IN DEXTROSE 360-4.14 MG/200ML-% IV SOLN
30.0000 mg/h | INTRAVENOUS | Status: DC
  Administered 2016-02-17 – 2016-02-21 (×8): 30 mg/h via INTRAVENOUS
  Filled 2016-02-17 (×9): qty 200

## 2016-02-17 MED ORDER — DOBUTAMINE IN D5W 4-5 MG/ML-% IV SOLN
1.5000 ug/kg/min | INTRAVENOUS | Status: DC
  Administered 2016-02-17 – 2016-02-20 (×2): 2.5 ug/kg/min via INTRAVENOUS
  Filled 2016-02-17 (×3): qty 250

## 2016-02-17 MED ORDER — FENTANYL CITRATE (PF) 2500 MCG/50ML IJ SOLN
25.0000 ug/h | INTRAMUSCULAR | Status: DC
  Administered 2016-02-18: 225 ug/h via INTRAVENOUS
  Administered 2016-02-18: 150 ug/h via INTRAVENOUS
  Administered 2016-02-19: 200 ug/h via INTRAVENOUS
  Administered 2016-02-19: 175 ug/h via INTRAVENOUS
  Administered 2016-02-20 (×2): 200 ug/h via INTRAVENOUS
  Administered 2016-02-21: 25 ug/h via INTRAVENOUS
  Administered 2016-02-22: 50 ug/h via INTRAVENOUS
  Filled 2016-02-17 (×7): qty 50

## 2016-02-17 MED ORDER — SODIUM CHLORIDE 0.9 % IV SOLN
1.0000 g | Freq: Once | INTRAVENOUS | Status: AC
  Administered 2016-02-17: 1 g via INTRAVENOUS
  Filled 2016-02-17: qty 10

## 2016-02-17 MED ORDER — SODIUM CHLORIDE 0.9 % IV SOLN
500.0000 mL | INTRAVENOUS | Status: DC | PRN
Start: 2016-02-17 — End: 2016-02-18

## 2016-02-17 MED ORDER — METHYLENE BLUE 0.5 % INJ SOLN
INTRAVENOUS | Status: AC
  Filled 2016-02-17: qty 10

## 2016-02-17 MED ORDER — SODIUM CHLORIDE 0.9 % IV SOLN
25.0000 ug/h | INTRAVENOUS | Status: DC
  Administered 2016-02-17: 50 ug/h via INTRAVENOUS
  Filled 2016-02-17: qty 50

## 2016-02-17 MED ORDER — NOREPINEPHRINE BITARTRATE 1 MG/ML IV SOLN
2.0000 ug/min | INTRAVENOUS | Status: DC
  Administered 2016-02-17 (×2): 60 ug/min via INTRAVENOUS
  Administered 2016-02-17: 50 ug/min via INTRAVENOUS
  Administered 2016-02-18: 28 ug/min via INTRAVENOUS
  Administered 2016-02-18: 44 ug/min via INTRAVENOUS
  Administered 2016-02-19: 7 ug/min via INTRAVENOUS
  Filled 2016-02-17 (×5): qty 16

## 2016-02-17 MED ORDER — MIDAZOLAM HCL 2 MG/2ML IJ SOLN: 2.0000 mg | Freq: Once | INTRAMUSCULAR | Status: DC

## 2016-02-17 MED ORDER — ETOMIDATE 2 MG/ML IV SOLN
20.0000 mg | Freq: Once | INTRAVENOUS | Status: AC
  Administered 2016-02-17: 20 mg via INTRAVENOUS

## 2016-02-17 MED ORDER — ANTISEPTIC ORAL RINSE SOLUTION (CORINZ)
7.0000 mL | OROMUCOSAL | Status: DC
  Administered 2016-02-18 – 2016-02-28 (×100): 7 mL via OROMUCOSAL

## 2016-02-17 MED ORDER — VASOPRESSIN 20 UNIT/ML IV SOLN
0.0300 [IU]/min | INTRAVENOUS | Status: DC
  Administered 2016-02-17 – 2016-02-18 (×3): 0.03 [IU]/min via INTRAVENOUS
  Filled 2016-02-17 (×2): qty 2

## 2016-02-17 MED ORDER — HEPARIN (PORCINE) IN NACL 100-0.45 UNIT/ML-% IJ SOLN
1100.0000 [IU]/h | INTRAMUSCULAR | Status: DC
  Administered 2016-02-17: 700 [IU]/h via INTRAVENOUS
  Administered 2016-02-19: 1000 [IU]/h via INTRAVENOUS
  Administered 2016-02-20: 1100 [IU]/h via INTRAVENOUS
  Filled 2016-02-17 (×3): qty 250

## 2016-02-17 MED ORDER — VANCOMYCIN HCL 500 MG IV SOLR
500.0000 mg | Freq: Two times a day (BID) | INTRAVENOUS | Status: DC
  Administered 2016-02-17 – 2016-02-19 (×5): 500 mg via INTRAVENOUS
  Filled 2016-02-17 (×7): qty 500

## 2016-02-17 MED ORDER — CHLORHEXIDINE GLUCONATE 0.12% ORAL RINSE (MEDLINE KIT)
15.0000 mL | Freq: Two times a day (BID) | OROMUCOSAL | Status: DC
  Administered 2016-02-17: 15 mL via OROMUCOSAL

## 2016-02-17 MED ORDER — POTASSIUM CHLORIDE 10 MEQ/50ML IV SOLN
10.0000 meq | INTRAVENOUS | Status: AC
Start: 2016-02-17 — End: 2016-02-17
  Administered 2016-02-17 (×4): 10 meq via INTRAVENOUS
  Filled 2016-02-17 (×4): qty 50

## 2016-02-17 MED ORDER — FENTANYL CITRATE (PF) 100 MCG/2ML IJ SOLN: 50.0000 ug | Freq: Once | INTRAMUSCULAR | Status: DC

## 2016-02-17 MED ORDER — AMIODARONE LOAD VIA INFUSION
150.0000 mg | Freq: Once | INTRAVENOUS | Status: AC
  Administered 2016-02-17: 150 mg via INTRAVENOUS
  Filled 2016-02-17: qty 83.34

## 2016-02-17 MED ORDER — FENTANYL CITRATE (PF) 100 MCG/2ML IJ SOLN: 100.0000 ug | Freq: Once | INTRAMUSCULAR | Status: DC

## 2016-02-17 MED ORDER — ETOMIDATE 2 MG/ML IV SOLN
INTRAVENOUS | Status: AC
  Filled 2016-02-17: qty 10

## 2016-02-17 MED ORDER — LIDOCAINE HCL (PF) 1 % IJ SOLN
INTRAMUSCULAR | Status: DC | PRN
  Administered 2016-02-17: 20 mL

## 2016-02-17 MED ORDER — DEXTROSE 5 % IV SOLN
INTRAVENOUS | Status: DC
  Administered 2016-02-17: 11:00:00 via INTRAVENOUS
  Filled 2016-02-17 (×3): qty 150

## 2016-02-17 MED ORDER — DEXAMETHASONE SODIUM PHOSPHATE 4 MG/ML IJ SOLN
4.0000 mg | Freq: Four times a day (QID) | INTRAMUSCULAR | Status: DC
Start: 2016-02-17 — End: 2016-02-17
  Filled 2016-02-17: qty 1

## 2016-02-17 MED ORDER — INSULIN ASPART 100 UNIT/ML ~~LOC~~ SOLN
0.0000 [IU] | SUBCUTANEOUS | Status: DC
  Administered 2016-02-17: 5 [IU] via SUBCUTANEOUS
  Administered 2016-02-17: 11 [IU] via SUBCUTANEOUS

## 2016-02-17 MED ORDER — HEPARIN SODIUM (PORCINE) 1000 UNIT/ML IJ SOLN
INTRAMUSCULAR | Status: DC | PRN
  Administered 2016-02-17: 4000 [IU] via INTRAVENOUS

## 2016-02-17 MED ORDER — SODIUM CHLORIDE 0.9 % IV SOLN: INTRAVENOUS | Status: DC

## 2016-02-17 MED ORDER — HEPARIN SODIUM (PORCINE) 1000 UNIT/ML IJ SOLN
INTRAMUSCULAR | Status: AC
  Filled 2016-02-17: qty 1

## 2016-02-17 MED ORDER — LIDOCAINE HCL (PF) 1 % IJ SOLN
INTRAMUSCULAR | Status: AC
  Filled 2016-02-17: qty 30

## 2016-02-17 MED ORDER — ANTISEPTIC ORAL RINSE SOLUTION (CORINZ): 7.0000 mL | Freq: Four times a day (QID) | OROMUCOSAL | Status: DC

## 2016-02-17 MED ORDER — ARTIFICIAL TEARS OP OINT
1.0000 | TOPICAL_OINTMENT | Freq: Three times a day (TID) | OPHTHALMIC | Status: DC
Start: 2016-02-17 — End: 2016-02-21
  Administered 2016-02-18 – 2016-02-20 (×10): 1 via OPHTHALMIC
  Filled 2016-02-17: qty 3.5

## 2016-02-17 MED ORDER — ROCURONIUM BROMIDE 50 MG/5ML IV SOLN
50.0000 mg | Freq: Once | INTRAVENOUS | Status: AC
  Administered 2016-02-17: 50 mg via INTRAVENOUS
  Filled 2016-02-17: qty 5

## 2016-02-17 MED ORDER — FUROSEMIDE 10 MG/ML IJ SOLN
5.0000 mg/h | INTRAVENOUS | Status: DC
  Administered 2016-02-17: 5 mg/h via INTRAVENOUS
  Filled 2016-02-17: qty 25

## 2016-02-17 MED ORDER — SODIUM BICARBONATE 8.4 % IV SOLN: 100.0000 meq | Freq: Once | INTRAVENOUS | Status: AC

## 2016-02-17 MED ORDER — SODIUM CHLORIDE 0.9% FLUSH
10.0000 mL | Freq: Two times a day (BID) | INTRAVENOUS | Status: DC
  Administered 2016-02-18: 20 mL
  Administered 2016-02-18 – 2016-02-22 (×6): 10 mL
  Administered 2016-02-22: 20 mL
  Administered 2016-02-23 (×2): 10 mL
  Administered 2016-02-23: 30 mL
  Administered 2016-02-24: 10 mL
  Administered 2016-02-24: 20 mL
  Administered 2016-02-25 – 2016-03-04 (×15): 10 mL
  Administered 2016-03-05: 20 mL
  Administered 2016-03-06: 10 mL
  Administered 2016-03-06: 20 mL
  Administered 2016-03-06 – 2016-03-10 (×9): 10 mL

## 2016-02-17 MED ORDER — MILRINONE IN DEXTROSE 20 MG/100ML IV SOLN
0.2500 ug/kg/min | INTRAVENOUS | Status: DC
  Administered 2016-02-17: 0.125 ug/kg/min via INTRAVENOUS
  Administered 2016-02-18: 0.25 ug/kg/min via INTRAVENOUS
  Filled 2016-02-17 (×2): qty 100

## 2016-02-17 MED ORDER — FENTANYL BOLUS VIA INFUSION
50.0000 ug | INTRAVENOUS | Status: DC | PRN
  Administered 2016-02-18 – 2016-02-22 (×4): 50 ug via INTRAVENOUS
  Filled 2016-02-17: qty 50

## 2016-02-17 MED ORDER — CHLORHEXIDINE GLUCONATE 0.12% ORAL RINSE (MEDLINE KIT)
15.0000 mL | Freq: Two times a day (BID) | OROMUCOSAL | Status: DC
  Administered 2016-02-17 – 2016-03-01 (×26): 15 mL via OROMUCOSAL

## 2016-02-17 MED ORDER — SODIUM CHLORIDE 0.9 % IV SOLN
1.0000 mg/h | INTRAVENOUS | Status: DC
  Administered 2016-02-17: 2 mg/h via INTRAVENOUS
  Administered 2016-02-18: 4 mg/h via INTRAVENOUS
  Administered 2016-02-18: 2 mg/h via INTRAVENOUS
  Administered 2016-02-19: 3 mg/h via INTRAVENOUS
  Administered 2016-02-20: 2 mg/h via INTRAVENOUS
  Administered 2016-02-22: 1 mg/h via INTRAVENOUS
  Filled 2016-02-17 (×6): qty 10

## 2016-02-17 MED ORDER — MIDAZOLAM BOLUS VIA INFUSION
2.0000 mg | INTRAVENOUS | Status: DC | PRN
  Administered 2016-02-18 – 2016-02-22 (×4): 2 mg via INTRAVENOUS
  Filled 2016-02-17 (×5): qty 2

## 2016-02-17 MED ORDER — AMIODARONE HCL IN DEXTROSE 360-4.14 MG/200ML-% IV SOLN
60.0000 mg/h | INTRAVENOUS | Status: AC
  Administered 2016-02-17 (×2): 60 mg/h via INTRAVENOUS
  Filled 2016-02-17 (×2): qty 200

## 2016-02-17 MED ORDER — FENTANYL BOLUS VIA INFUSION
50.0000 ug | INTRAVENOUS | Status: DC | PRN
  Administered 2016-02-21 (×2): 50 ug via INTRAVENOUS
  Filled 2016-02-17: qty 50

## 2016-02-17 MED ORDER — FENTANYL CITRATE (PF) 100 MCG/2ML IJ SOLN: 100.0000 ug | Freq: Once | INTRAMUSCULAR | Status: DC | PRN

## 2016-02-17 MED ORDER — MIDAZOLAM HCL 2 MG/2ML IJ SOLN: 2.0000 mg | Freq: Once | INTRAMUSCULAR | Status: DC | PRN

## 2016-02-17 MED ORDER — HEPARIN (PORCINE) IN NACL 2-0.9 UNIT/ML-% IJ SOLN
INTRAMUSCULAR | Status: AC
  Filled 2016-02-17: qty 1000

## 2016-02-17 MED ORDER — CISATRACURIUM BESYLATE (PF) 200 MG/20ML IV SOLN
3.0000 ug/kg/min | INTRAVENOUS | Status: DC
  Administered 2016-02-17 – 2016-02-20 (×4): 3 ug/kg/min via INTRAVENOUS
  Filled 2016-02-17 (×4): qty 20

## 2016-02-17 MED ORDER — DEXAMETHASONE SODIUM PHOSPHATE 10 MG/ML IJ SOLN
10.0000 mg | Freq: Four times a day (QID) | INTRAMUSCULAR | Status: AC
  Administered 2016-02-17 – 2016-02-21 (×16): 10 mg via INTRAVENOUS
  Filled 2016-02-17 (×11): qty 1
  Filled 2016-02-17: qty 2.5
  Filled 2016-02-17 (×10): qty 1

## 2016-02-17 MED ORDER — SODIUM CHLORIDE 0.9 % IV SOLN
2.0000 g | Freq: Four times a day (QID) | INTRAVENOUS | Status: DC
  Filled 2016-02-17 (×2): qty 2000

## 2016-02-17 MED FILL — Medication: Qty: 1 | Status: AC

## 2016-02-17 SURGICAL SUPPLY — 5 items
BALLN LINEAR 7.5FR IABP 40CC (BALLOONS) ×2
BALLOON LINEAR 7.5FR IABP 40CC (BALLOONS) ×1 IMPLANT
DEVICE SECURE STATLOCK IABP (MISCELLANEOUS) ×4 IMPLANT
HOVERMATT SINGLE USE (MISCELLANEOUS) ×2 IMPLANT
PACK CARDIAC CATHETERIZATION (CUSTOM PROCEDURE TRAY) ×2 IMPLANT

## 2016-02-17 NOTE — Progress Notes (Signed)
Pharmacy Antibiotic Note  Dale [name] is a 50 y.o. male admitted on 02/16/2016 with AMS. Presented to the ED 3 days earlier and discharged with Tamiflu even though no PCR test was done. He was also treated for Trich. Apparently did not fill Rx for Tamiflu though. Started on abx for possible meningitis after CSF preliminary results seen and complaints of nuchal rigidity. CSF showed neutrophilic pleocytosis, WBC of 340, 88% N, protein > 600, glucose < 20, and gram stain showing GPC in pairs. Ultimately decompensated needing admission to the ICU.  Now day #2 of abx for bacterial meningitis. Blood cx also growing 2/2 GPC in pairs. Tmax was 105.8, WBC increasing to 19.7. SCr elevated at 1.65, CrCl ~6250ml/min.  Plan: Continue ceftriaxone 2g IV Q12 Increase vancomycin to 500mg  IV Q12 Increase ampicillin to 2g IV Q6 Monitor clinical picture, renal function, VT at Css F/U CSF and blood cx results, abx de-escalation, LOT  Most likely will not need ampicillin as listeria usually shows as gram positive rods  Height: 5' 8.9" (175 cm) Weight: 147 lb 7.8 oz (66.9 kg) IBW/kg (Calculated) : 70.47  Temp (24hrs), Avg:103.7 F (39.8 C), Min:98.2 F (36.8 C), Max:105.8 F (41 C)   Recent Labs Lab 02/13/16 1753 02/13/16 2016 02/16/16 1200 02/16/16 1209 02/16/16 1717 02/16/16 2006 02/16/16 2016 02/17/16 0030 02/17/16 0033 02/17/16 0035 02/17/16 0500  WBC 13.3*  --  18.4*  --   --   --  16.9*  --   --  15.4* 19.7*  CREATININE 1.84*  --  1.66*  --   --   --  1.28*  --  1.86*  --  1.65*  LATICACIDVEN  --  1.1  --  3.45* 2.11* 1.5  --  4.9*  --   --   --     Estimated Creatinine Clearance: 50.1 mL/min (by C-G formula based on Cr of 1.65).    No Known Allergies  Antimicrobials this admission: 3/14 CTX >>  3/14 vanc >> 3/14 amp >>  Microbiology results: 3/14 BCx: 2/2 GPC in pairs 3/14 UCx:  Sent 3/14 CSF: GPC in pairs  Dale [name], PharmD, [facility] Clinical Pharmacist Pager  [phone] 02/17/2016 8:10 AM

## 2016-02-17 NOTE — Progress Notes (Signed)
Inpatient Diabetes Program Recommendations  AACE/ADA: New Consensus Statement on Inpatient Glycemic Control (2015)  Target Ranges:  Prepandial:   less than 140 mg/dL      Peak postprandial:   less than 180 mg/dL (1-2 hours)      Critically ill patients:  140 - 180 mg/dL   Review of Glycemic Control:  Results for Dale [name], Dale [name] (MRN [id number]) as of 02/17/2016 14:20  Ref. Range 02/16/2016 11:26 02/17/2016 08:37  Glucose-Capillary Latest Ref Range: 65-99 mg/dL 409134 (H) 811304 (H)   Blood sugars increased with steroids.   Inpatient Diabetes Program Recommendations:   Note that Novolog correction started today.  May consider "ICU Glycemic Control" order set while in ICU.  Thanks, Dale MeagerJenny Gaylon Melchor, RN, BC-ADM Inpatient Diabetes Coordinator Pager [phone] (8a-5p)

## 2016-02-17 NOTE — Procedures (Signed)
Arterial Catheter Insertion Procedure Note Dale [name] [id number] 08/07/1966  Procedure: Insertion of Arterial Catheter  Indications: Blood pressure monitoring  Procedure Details Consent: Unable to obtain consent because of emergent medical necessity. Time Out: Verified patient identification, verified procedure, site/side was marked, verified correct patient position, special equipment/implants available, medications/allergies/relevent history reviewed, required imaging and test results available.  Performed  Maximum sterile technique was used including antiseptics, cap, gloves, gown, hand hygiene, mask and sheet. Skin prep: Chlorhexidine; local anesthetic administered 20 gauge catheter was inserted into right femoral artery using the Seldinger technique.  Evaluation Blood flow good; BP tracing good. Complications: No apparent complications.  Joneen RoachPaul Hoffman, AGACNP-BC [facility] Pager [phone] or [phone]  02/17/2016 12:50 AM

## 2016-02-17 NOTE — Progress Notes (Signed)
Interval History:                                                                                                                      Dale [name] is an 50 y.o. male patient with bacterial meningitis, Gram stain showing GPC in pairs.  On vanc, ceftriaxone.  Streptococcal bacteremia, and sepsis, septic shock requiring pressors and balloon pump for blood pressure support.    Was initially sedated propofol which further lower his blood pressure. It was discontinued and is only on fentanyl now for sedation, paralyzed currently.   Past Medical History: Past Medical History  Diagnosis Date  . Depression     Past Surgical History  Procedure Laterality Date  . Ankle surgery      Family History: No family history on file.  Social History:   reports that he has been smoking Cigarettes.  He has been smoking about 0.10 packs per day. He does not have any smokeless tobacco history on file. He reports that he drinks alcohol. He reports that he uses illicit drugs (Marijuana).  Allergies:  No Known Allergies   Medications:                                                                                                                         Current facility-administered medications:  .  0.9 %  sodium chloride infusion, , Intravenous, Continuous, Rahul P Desai, PA-C, Last Rate: 100 mL/hr at 02/16/16 2002 .  0.9 %  sodium chloride infusion, 250 mL, Intravenous, PRN, Rahul P Desai, PA-C .  0.9 %  sodium chloride infusion, 500 mL, Intravenous, Q30 min PRN, Duayne Cal, NP .  0.9 %  sodium chloride infusion, , Intravenous, Continuous, Duayne Cal, NP, Last Rate: 75 mL/hr at 02/17/16 0159, 75 mL/hr at 02/17/16 0159 .  acetaminophen (TYLENOL) suppository 650 mg, 650 mg, Rectal, Q4H PRN, Alvira Monday, MD, 650 mg at 02/16/16 2103 .  albuterol (PROVENTIL) (2.5 MG/3ML) 0.083% nebulizer solution 2.5 mg, 2.5 mg, Nebulization, Q3H PRN, Rahul P Desai, PA-C .  amiodarone (NEXTERONE PREMIX) 360  MG/200ML (1.8 mg/mL) IV infusion, 60 mg/hr, Intravenous, Continuous, Last Rate: 33.3 mL/hr at 02/17/16 0854, 60 mg/hr at 02/17/16 0854 **FOLLOWED BY** amiodarone (NEXTERONE PREMIX) 360 MG/200ML (1.8 mg/mL) IV infusion, 30 mg/hr, Intravenous, Continuous, Cyril Mourning V, MD .  antiseptic oral rinse solution (CORINZ), 7 mL, Mouth Rinse, QID, Duayne Cal, NP .  aspirin tablet 325 mg, 325 mg, Oral, Daily, Viviann Spare  Adline PealsE Sommer, MD .  cefTRIAXone (ROCEPHIN) 2 g in dextrose 5 % 50 mL IVPB, 2 g, Intravenous, Q12H, Almon HerculesHaley P Baird, RPH, 2 g at 02/17/16 0015 .  chlorhexidine gluconate (PERIDEX) 0.12 % solution 15 mL, 15 mL, Mouth Rinse, BID, Duayne CalPaul W Hoffman, NP, 15 mL at 02/17/16 0827 .  dexamethasone (DECADRON) injection 4 mg, 4 mg, Intravenous, 4 times per day, Tyus Kallam Daniel NonesNarayan Kaveer Adilen Pavelko, MD .  fentaNYL (SUBLIMAZE) 2,500 mcg in sodium chloride 0.9 % 250 mL (10 mcg/mL) infusion, 25-400 mcg/hr, Intravenous, Continuous, Stephanie AcreVishal Mungal, MD, Stopped at 02/17/16 16100822 .  fentaNYL (SUBLIMAZE) bolus via infusion 50 mcg, 50 mcg, Intravenous, Q1H PRN, Vishal Mungal, MD .  folic acid injection 1 mg, 1 mg, Intravenous, Daily, Rahul P Desai, PA-C .  heparin injection 5,000 Units, 5,000 Units, Subcutaneous, 3 times per day, Rahul P Desai, PA-C, 5,000 Units at 02/17/16 0510 .  metoprolol (LOPRESSOR) injection 2.5 mg, 2.5 mg, Intravenous, 4 times per day, Karl ItoSteven E Sommer, MD, 2.5 mg at 02/17/16 0548 .  midazolam (VERSED) injection 2 mg, 2 mg, Intravenous, Q15 min PRN, Vishal Mungal, MD .  midazolam (VERSED) injection 2 mg, 2 mg, Intravenous, Q2H PRN, Vishal Mungal, MD .  norepinephrine (LEVOPHED) 4 mg in dextrose 5 % 250 mL (0.016 mg/mL) infusion, 2-50 mcg/min, Intravenous, Continuous, Vishal Mungal, MD, Last Rate: 82.5 mL/hr at 02/17/16 0907, 22 mcg/min at 02/17/16 0907 .  pantoprazole (PROTONIX) injection 40 mg, 40 mg, Intravenous, Daily, Hollice EspySendil K Krishnan, MD, 40 mg at 02/16/16 2056 .  thiamine (B-1) injection 100 mg, 100 mg,  Intravenous, Daily, Rahul P Desai, PA-C, 100 mg at 02/16/16 2057 .  vancomycin (VANCOCIN) 500 mg in sodium chloride 0.9 % 100 mL IVPB, 500 mg, Intravenous, Q12H, Para Marchathan J Batchelder, RPH, 500 mg at 02/17/16 0853 .  vasopressin (PITRESSIN) 40 Units in sodium chloride 0.9 % 250 mL (0.16 Units/mL) infusion, 0.03 Units/min, Intravenous, Continuous, Cyril Mourningakesh Alva V, MD, Last Rate: 11.3 mL/hr at 02/17/16 0822, 0.03 Units/min at 02/17/16 0822   Neurologic Examination:                                                                                                     Today's Vitals   02/17/16 0615 02/17/16 0630 02/17/16 0716 02/17/16 0839  BP: 92/77 93/74 108/72   Pulse: 67  136   Temp:    100.1 F (37.8 C)  TempSrc:    Rectal  Resp: 21 21 20    Height:      Weight:      SpO2: 96%  96%    Intubated, sedated, no motor response, paralyzed.     Lab Results: Basic Metabolic Panel:  Recent Labs Lab 02/13/16 1753 02/16/16 1200 02/16/16 2016 02/17/16 0033 02/17/16 0500  NA 127* 131*  --  141 134*  K 3.7 4.1  --  3.0* 4.4  CL 96* 93*  --  106 106  CO2 18* 19*  --  17* 14*  GLUCOSE 100* 132*  --  145* 309*  BUN 27* 17  --  16 22*  CREATININE 1.84*  1.66* 1.28* 1.86* 1.65*  CALCIUM 7.7* 9.0  --  6.5* 6.5*  MG  --   --   --  2.2 2.1  PHOS  --   --   --  4.6 3.4    Liver Function Tests:  Recent Labs Lab 02/16/16 1200 02/17/16 0033  AST 117* 174*  ALT 42 45  ALKPHOS 56 39  BILITOT 1.1 1.7*  PROT 7.5 5.0*  ALBUMIN 2.9* 1.7*   No results for input(s): LIPASE, AMYLASE in the last 168 hours. No results for input(s): AMMONIA in the last 168 hours.  CBC:  Recent Labs Lab 02/13/16 1753 02/16/16 1200 02/16/16 2016 02/17/16 0035 02/17/16 0500  WBC 13.3* 18.4* 16.9* 15.4* 19.7*  NEUTROABS  --  14.5*  --   --   --   HGB 12.6* 13.9 12.8* 11.0* 12.0*  HCT 34.9* 37.9* 36.7* 32.6* 33.6*  MCV 87.0 87.7 87.6 89.1 88.4  PLT 147* 155 137* 120* 136*    Cardiac Enzymes:  Recent  Labs Lab 02/13/16 1753 02/16/16 2016 02/17/16 0033 02/17/16 0500  CKTOTAL  --   --  645*  --   TROPONINI <0.03 7.59* 13.02* 6.43*    Lipid Panel:  Recent Labs Lab 02/16/16 2016  TRIG 54    CBG:  Recent Labs Lab 02/16/16 1126  GLUCAP 134*    Microbiology: Results for orders placed or performed during the hospital encounter of 02/16/16  Blood Culture (routine x 2)     Status: None (Preliminary result)   Collection Time: 02/16/16 11:53 AM  Result Value Ref Range Status   Specimen Description BLOOD RIGHT ANTECUBITAL  Final   Special Requests BOTTLES DRAWN AEROBIC AND ANAEROBIC 5CC  Final   Culture  Setup Time   Final    GRAM POSITIVE COCCI IN CHAINS IN PAIRS IN BOTH AEROBIC AND ANAEROBIC BOTTLES CRITICAL RESULT CALLED TO, READ BACK BY AND VERIFIED WITH: B WOODSON,RN @0346  02/17/16 MKELLY    Culture PENDING  Incomplete   Report Status PENDING  Incomplete  Blood Culture (routine x 2)     Status: None (Preliminary result)   Collection Time: 02/16/16 12:00 PM  Result Value Ref Range Status   Specimen Description BLOOD RIGHT HAND  Final   Special Requests BOTTLES DRAWN AEROBIC AND ANAEROBIC 5CC  Final   Culture  Setup Time   Final    GRAM POSITIVE COCCI IN CHAINS IN PAIRS IN BOTH AEROBIC AND ANAEROBIC BOTTLES CRITICAL RESULT CALLED TO, READ BACK BY AND VERIFIED WITH: B WOODSON,RN @0346  02/17/16 MKELLY    Culture PENDING  Incomplete   Report Status PENDING  Incomplete  CSF culture with Stat gram stain     Status: None (Preliminary result)   Collection Time: 02/16/16  4:44 PM  Result Value Ref Range Status   Specimen Description CSF  Final   Special Requests NONE  Final   Gram Stain   Final    WBC PRESENT,BOTH PMN AND MONONUCLEAR GRAM POSITIVE COCCI IN PAIRS Gram Stain Report Called to,Read Back By and Verified With: Tyrone Apple RN 1806 02/16/16 A BROWNING CYTOSPIN    Culture TOO YOUNG TO READ  Final   Report Status PENDING  Incomplete  MRSA PCR Screening      Status: None   Collection Time: 02/16/16 11:11 PM  Result Value Ref Range Status   MRSA by PCR NEGATIVE NEGATIVE Final    Comment:        The GeneXpert MRSA Assay (FDA approved for NASAL specimens only),  is one component of a comprehensive MRSA colonization surveillance program. It is not intended to diagnose MRSA infection nor to guide or monitor treatment for MRSA infections.     Imaging: Dg Chest 2 View  02/13/2016  CLINICAL DATA:  50 year old male with history of anterior chest pain for the past 2 days. Low-grade fever. Dizziness. EXAM: CHEST  2 VIEW COMPARISON:  Chest x-ray 06/01/2009. FINDINGS: Lung volumes are normal. No consolidative airspace disease. No pleural effusions. No pneumothorax. No pulmonary nodule or mass noted. Pulmonary vasculature and the cardiomediastinal silhouette are within normal limits. IMPRESSION: No radiographic evidence of acute cardiopulmonary disease. Electronically Signed   By: Trudie Reed M.D.   On: 02/13/2016 17:57   Dg Thoracic Spine 2 View  02/13/2016  CLINICAL DATA:  O thoracic and lower back pain for 1 week. Fall 2 days ago. Pain was before the fall. EXAM: THORACIC SPINE 2 VIEWS COMPARISON:  Chest x-ray today and 06/01/2009 FINDINGS: There is no evidence of thoracic spine fracture. Alignment is normal. No other significant bone abnormalities are identified. IMPRESSION: Negative. Electronically Signed   By: Charlett Nose M.D.   On: 02/13/2016 20:45   Dg Lumbar Spine Complete  02/13/2016  CLINICAL DATA:  Low back pain and recent fall. EXAM: LUMBAR SPINE - COMPLETE 4+ VIEW COMPARISON:  08/25/2014 lumbar spine radiographs. FINDINGS: This report assumes 5 non rib-bearing lumbar vertebrae. Lumbar vertebral body heights are preserved, with no fracture. There is mild loss of disc height at L4-5 with associated minimal spondylosis, not appreciably changed. Otherwise preserved lumbar disc spaces. There is new 3 mm anterolisthesis at L4-5. No appreciable  facet arthropathy. No appreciable foraminal stenosis. No aggressive appearing focal osseous lesions. IMPRESSION: 1. No lumbar spine fracture. 2. Stable mild degenerative disc disease at L4-5. 3. New mild 3 mm anterolisthesis at L4-5. Electronically Signed   By: Delbert Phenix M.D.   On: 02/13/2016 20:47   Ct Head Wo Contrast  02/16/2016  CLINICAL DATA:  Fever and altered mental status; tremors EXAM: CT HEAD WITHOUT CONTRAST TECHNIQUE: Contiguous axial images were obtained from the base of the skull through the vertex without intravenous contrast. COMPARISON:  June 01, 2009 FINDINGS: The ventricles are mildly enlarged in a generalized manner and appear larger compared to the prior study. The sulci appear unremarkable. There is no intracranial mass, hemorrhage, extra-axial fluid collection, or midline shift. No focal gray-white compartment lesions are identified. No evidence of acute infarct. Bony calvarium appears intact. The mastoid air cells are clear. There is opacification in the left maxillary antrum as well as in multiple ethmoid air cells bilaterally. There is mild mucosal thickening in the left sphenoid sinus region. No intraorbital lesions are apparent. IMPRESSION: Ventricles mildly enlarged with sulci appearing normal. This appearance potentially could represent a degree of normal pressure hydrocephalus. This finding also could be secondary to prior or recent meningitis meningitis. No intracranial mass, hemorrhage, or extra-axial fluid collection. No acute infarct evident. Multifocal sinusitis. Given these current findings, brain MRI pre and post-contrast may be helpful to further assess, in particular to further evaluate the meningeal regions. Electronically Signed   By: Bretta Bang III M.D.   On: 02/16/2016 14:04   Dg Chest Port 1 View  02/17/2016  CLINICAL DATA:  Hypoxia EXAM: PORTABLE CHEST 1 VIEW COMPARISON:  February 16, 2016 FINDINGS: Endotracheal tube tip is 3.5 cm above the carina.  Nasogastric tube tip and side port are in the stomach. No pneumothorax evident. There is interstitial edema throughout the lungs  bilaterally, perhaps slightly increased from 1 day prior. No airspace consolidation. Heart is upper normal in size with pulmonary vascularity within normal limits. No adenopathy. IMPRESSION: Tube positions as described without pneumothorax. Slight increase in interstitial edema. No airspace consolidation. No change in cardiac silhouette. Electronically Signed   By: Bretta Bang III M.D.   On: 02/17/2016 07:31   Dg Chest Portable 1 View  02/16/2016  CLINICAL DATA:  Endotracheal tube placement EXAM: PORTABLE CHEST 1 VIEW COMPARISON:  Chest x-ray from earlier same day. FINDINGS: Endotracheal tube well positioned with tip approximately 2 cm above the carina. Enteric tube passes below the diaphragm. Heart size is normal. There is central pulmonary vascular congestion and mild interstitial edema. Suspect small left pleural effusion. No pneumothorax. Osseous structures about the chest are unremarkable. IMPRESSION: 1. Endotracheal tube well positioned with tip approximately 2 cm above the carina. 2. Central pulmonary vascular congestion and mild bilateral interstitial edema suggesting mild volume overload/resuscitation efforts. No frank alveolar pulmonary edema. Electronically Signed   By: Bary Richard M.D.   On: 02/16/2016 19:38   Dg Chest Port 1 View  02/16/2016  CLINICAL DATA:  Altered mental status.  Depression. EXAM: PORTABLE CHEST 1 VIEW COMPARISON:  02/13/2016. FINDINGS: Midline trachea. Normal heart size and mediastinal contours. Left costophrenic angle exclusion. No pleural effusion or pneumothorax. Clear lungs. IMPRESSION: No active disease. Electronically Signed   By: Jeronimo Greaves M.D.   On: 02/16/2016 13:48   Ct Portable Head W/o Cm  02/17/2016  CLINICAL DATA:  50 year old male with a history of hydrocephalus. Acute meningitis. EXAM: CT HEAD WITHOUT CONTRAST TECHNIQUE:  Contiguous axial images were obtained from the base of the skull through the vertex without intravenous contrast. COMPARISON:  02/16/2016 FINDINGS: Unremarkable appearance of the calvarium without acute fracture or aggressive lesion. Unremarkable appearance of the scalp soft tissues. Unremarkable appearance of the bilateral orbits. Partially visualized endotracheal tube.  Oral enteric tube. Near complete opacification of the left maxillary sinus with frothy secretions. Trace secretions of the right maxillary sinus. Mucoperiosteal thickening of the ethmoid air cells and the sphenoid sinuses. Trace disease of the frontal sinuses. Mastoid air cells are clear. Layered fluid within the nasopharynx. Configuration the ventricles is similar to comparison with bifrontal diameter measuring 40 mm, unchanged from prior. Unchanged appearance of the hemispheric sulci. No acute intracranial hemorrhage.  No midline shift. Gray-white differentiation relatively maintained. IMPRESSION: Unchanged ventricular dilation and appearance of the cerebral sulci, with no evidence of acute hemorrhage or infarction. Signed, Yvone Neu. Loreta Ave, DO [facility] Electronically Signed   By: Gilmer Mor D.O.   On: 02/17/2016 07:23    Assessment and plan:   Dale [name] is an 50 y.o. male patient with bacterial meningitis, Gram stain showing GPC in pairs. On vancomycin and, ceftriaxone. Added Decadron to the regimen.  EEG showed burst suppression pattern likely from a combination of fentanyl, in addition to severe encephalopathy. No seizures noted on the EEG.  Patient in  septic shock, maxed on pressors, requiring balloon pump for blood pressure support.  Overall, poor prognosis for neurological recovery.  We'll follow-up.

## 2016-02-17 NOTE — Progress Notes (Addendum)
Pt received from ED approximately 2300.   At 2357 pt a cardiac arrest, ACLS provided with ROSC. Levophed infusion started, propofol stopped.  Will continue to monitor.

## 2016-02-17 NOTE — Progress Notes (Signed)
Bedside RN notified Elink of critical Ca=6.2 at [id number]. Elink MD notified. Will continue to monitor.

## 2016-02-17 NOTE — Progress Notes (Signed)
eLink Physician-Brief Progress Note Patient Name: Dale [name] DOB: 04/07/1966 MRN: [id number]   Date of Service  02/17/2016  HPI/Events of Note  Hypotension - BP = 70/50. Already on Norepinephrine IV infusion at 50 mcg/min, Dobutamine IV infusion at 5 mcg/kg/min and Vasopressin at shock dose.  eICU Interventions  Will order: 1. NaHCO3 100 meq IV now. 2. ABG at 7  PM. 3. Increase ceiling on Norepinephrine IV infusion to 80 mcg/min.     Intervention Category Major Interventions: Hypotension - evaluation and management  Lenell AntuSommer,Steven Eugene 02/17/2016, 6:27 PM

## 2016-02-17 NOTE — Progress Notes (Signed)
PULMONARY / CRITICAL CARE MEDICINE   Name: Dale [name] MRN: [id number] DOB: 1966-04-19    ADMISSION DATE:  02/16/2016 CONSULTATION DATE:  02/16/16  REFERRING MD:  Rito Ehrlich  CHIEF COMPLAINT:  AMS  Dale [name] is a 50 y.o. male with PMH as outlined below. He initially presented to [facility] ED 03/11 with URI symptoms. [facility] managed for meningitis, SHOCK, sepsis, cardiomyopathy, coded.  SUBJECTIVE:  Opens eyes to pain  VITAL SIGNS: BP 101/66 mmHg  Pulse 123  Temp(Src) 98.1 F (36.7 C) (Rectal)  Resp 35  Ht 5' 8.9" (1.75 m)  Wt 66.9 kg (147 lb 7.8 oz)  BMI 21.84 kg/m2  SpO2 96%  HEMODYNAMICS: PAP: (36-74)/(25-56) 36/25 mmHg CVP:  [1 mmHg-17 mmHg] 17 mmHg CO:  [2.5 L/min] 2.5 L/min  VENTILATOR SETTINGS: Vent Mode:  [-] PRVC FiO2 (%):  [80 %-100 %] 100 % Set Rate:  [16 bmp-35 bmp] 35 bmp Vt Set:  [600 mL] 600 mL PEEP:  [10 cmH20-18 cmH20] 18 cmH20 Plateau Pressure:  [19 cmH20-41 cmH20] 39 cmH20  INTAKE / OUTPUT: I/O last 3 completed shifts: In: 7760.1 [I.V.:7190.1; NG/GT:60; IV Piggyback:510] Out: 4098 [JXBJY:7829; Emesis/NG output:450]  PHYSICAL EXAMINATION: General: Adult male, thin, unresponsive Neuro: per pinpoint, gag present, no movement arms legs HEENT: [place]/AT. PERRL Cardiovascular: s1 s2 Tachy, regular. IABP pump on auscultation of chest and abdomen Lungs: Ronchi Abdomen: BS x 4, soft, NT/ND.  Musculoskeletal: No gross deformities, no edema.  Skin: Intact, dry, warm, no rashes.  LABS:  BMET  Recent Labs Lab 02/17/16 0033 02/17/16 0500 02/17/16 1903  NA 141 134* 137  K 3.0* 4.4 3.7  CL 106 106 102  CO2 17* 14* 20*  BUN 16 22* 28*  CREATININE 1.86* 1.65* 1.82*  GLUCOSE 145* 309* 248*    Electrolytes  Recent Labs Lab 02/17/16 0033 02/17/16 0500 02/17/16 1903  CALCIUM 6.5* 6.5* 6.2*  MG 2.2 2.1 2.0  PHOS 4.6 3.4 2.7    CBC  Recent Labs Lab 02/16/16 2016 02/17/16 0035 02/17/16 0500  WBC 16.9* 15.4* 19.7*  HGB 12.8* 11.0* 12.0*  HCT  36.7* 32.6* 33.6*  PLT 137* 120* 136*    Coag's  Recent Labs Lab 02/17/16 0053  APTT 49*  INR 1.14    Sepsis Markers  Recent Labs Lab 02/16/16 1717 02/16/16 2006 02/17/16 0030 02/17/16 0053  LATICACIDVEN 2.11* 1.5 4.9*  --   PROCALCITON  --   --   --  73.53    ABG  Recent Labs Lab 02/17/16 0928 02/17/16 1142 02/17/16 1533  PHART 7.327* 7.272* 7.238*  PCO2ART 22.9* 26.4* 31.6*  PO2ART 47.0* 48.0* 55.0*    Liver Enzymes  Recent Labs Lab 02/16/16 1200 02/17/16 0033  AST 117* 174*  ALT 42 45  ALKPHOS 56 39  BILITOT 1.1 1.7*  ALBUMIN 2.9* 1.7*    Cardiac Enzymes  Recent Labs Lab 02/17/16 0033 02/17/16 0500 02/17/16 0845  TROPONINI 13.02* 6.43* 4.74*    Glucose  Recent Labs Lab 02/16/16 1126 02/17/16 0837 02/17/16 1513  GLUCAP 134* 304* 302*    Imaging Dg Chest Port 1 View  02/17/2016  CLINICAL DATA:  Ventilator dependence. EXAM: PORTABLE CHEST 1 VIEW COMPARISON:  Earlier the same day. FINDINGS: 1822 hours. Endotracheal tube tip is 7.4 cm above the base of the carina. Right IJ pulmonary artery catheter tip is in the interlobar pulmonary artery. The tip of the intra-aortic balloon pump projects over the inferior aspect of the transverse aorta. The NG tube passes into the stomach  although the distal tip position is not included on the film. Proximal port of the NG tube is at or just above the EG junction. Diffuse interstitial and central alveolar opacities suggest edema. Telemetry leads overlie the chest. IMPRESSION: The tip of the intra-aortic balloon pump projects over the inferior aspect of the transverse aorta. Electronically Signed   By: Kennith Center M.D.   On: 02/17/2016 18:34   Dg Chest Port 1 View  02/17/2016  CLINICAL DATA:  50 year old male with a history of Swan-Ganz catheter placement. EXAM: PORTABLE CHEST 1 VIEW COMPARISON:  02/17/2016 FINDINGS: Cardiomediastinal silhouette unchanged in size and contour. Skull pads again project over  the thorax. Unchanged endotracheal tube, gastric tube. Interval placement of right IJ sheath, through which a Swan-Ganz catheter has been placed terminating in the right pulmonary arteries. Persisting bilateral airspace opacity IMPRESSION: Interval placement of right IJ sheath, through which a Swan-Ganz catheter has been placed and terminates in the right pulmonary arteries at the hilum. Similar appearance of bilateral airspace opacities, potentially infection and/or edema. Unchanged support apparatus as above. Signed, Yvone Neu. Loreta Ave, DO [facility] Radiology Electronically Signed   By: Gilmer Mor D.O.   On: 02/17/2016 13:53   Dg Chest Port 1 View  02/17/2016  CLINICAL DATA:  Hypoxia EXAM: PORTABLE CHEST 1 VIEW COMPARISON:  February 16, 2016 FINDINGS: Endotracheal tube tip is 3.5 cm above the carina. Nasogastric tube tip and side port are in the stomach. No pneumothorax evident. There is interstitial edema throughout the lungs bilaterally, perhaps slightly increased from 1 day prior. No airspace consolidation. Heart is upper normal in size with pulmonary vascularity within normal limits. No adenopathy. IMPRESSION: Tube positions as described without pneumothorax. Slight increase in interstitial edema. No airspace consolidation. No change in cardiac silhouette. Electronically Signed   By: Bretta Bang III M.D.   On: 02/17/2016 07:31   Ct Portable Head W/o Cm  02/17/2016  CLINICAL DATA:  50 year old male with a history of hydrocephalus. Acute meningitis. EXAM: CT HEAD WITHOUT CONTRAST TECHNIQUE: Contiguous axial images were obtained from the base of the skull through the vertex without intravenous contrast. COMPARISON:  02/16/2016 FINDINGS: Unremarkable appearance of the calvarium without acute fracture or aggressive lesion. Unremarkable appearance of the scalp soft tissues. Unremarkable appearance of the bilateral orbits. Partially visualized endotracheal  tube.  Oral enteric tube. Near complete opacification of the left maxillary sinus with frothy secretions. Trace secretions of the right maxillary sinus. Mucoperiosteal thickening of the ethmoid air cells and the sphenoid sinuses. Trace disease of the frontal sinuses. Mastoid air cells are clear. Layered fluid within the nasopharynx. Configuration the ventricles is similar to comparison with bifrontal diameter measuring 40 mm, unchanged from prior. Unchanged appearance of the hemispheric sulci. No acute intracranial hemorrhage.  No midline shift. Gray-white differentiation relatively maintained. IMPRESSION: Unchanged ventricular dilation and appearance of the cerebral sulci, with no evidence of acute hemorrhage or infarction. Signed, Yvone Neu. Loreta Ave, DO [facility] Radiology Electronically Signed   By: Gilmer Mor D.O.   On: 02/17/2016 07:23     STUDIES:  CXR 03/14 > central vascular congestion. CT head 03/14 > mildly enlarged ventricles with normal sulci.  Could potentially represent NPH or meningitis.  Multifocal sinusitis. 3/15 early AM > Bedside echo by cardiology fellow noted very low LVEF likely 10%. RV down as well, but now quite as bad.   CULTURES: Blood 03/14 > Urine 03/14 > CSF 03/14 > 340 WBC's (88  neut), < 20 glucose, GPC's in pairs >   ANTIBIOTICS: Vanc 03/14 >>> Ceftriaxone 03/14 >>> Ampicillin 03/14 >>>3/15  SIGNIFICANT EVENTS: 03/14 > admitted with meningitis.  Required intubation for airway protection due to decline in mental status. 3/15 early AM > Cardiac arrest, 5 mins, Asystole > VF > ST 3/15 ARDS > worsening shock > IABP placed. Deep sedation and NMB initiated  LINES/TUBES: ETT 03/14 > CVL R fem 3/15 (placed emergently, but under full sterile precautions)>>> Art line R fem 3/15 (placed emergently, but under full sterile precautions)>>>  DISCUSSION: 50 y.o. M admitted 03/14 for AMS secondary to meningitis.  He  required intubation in ED for decline in mental status. Suffered cardiac arrest 3/15 early AM. Worsening shock requiring IABP. Also developed ARDS on deep sedation and NMB  ASSESSMENT / PLAN:  NEUROLOGIC A:   Acute metabolic encephalopathy -meningitis.  S/p LP in ED 03/14. ETOH use disorder. Polysubstance abuse - UDS positive for THC. Anoxia post arrest R/o additional complicating feature to meningitis (abscess, cva) P:   Sedation:  Versed ( )/ Fentanyl (150 mcg) RASS goal: -4 to -5 (BIS goal < 40) Neurology following MRI when able Consider EEG Continue decadron Thiamine / Folate.  PULMONARY A:  Acute resp failure CHF, pulm edema worsening ARDS by P/F ratio P:   Full MV support . Currently Vt600, Rate 35, 100% 18PEEP ARDS protocol, unable to reach 6cc/kg goal at this time due to MV needs, will repeat ABG and see if we can decrease Vt as RR already 35 and plat pressures high Neg balance goal, on lasix gtt may need to consider CVVHD to achieve this at some point. Vent bundle CXR in AM  CARDIOVASCULAR A:  Shock - septic + cardiogenic  Sinus tachycardia - likely combination of sepsis + agitation post intubation. Acute systolic heart failure - Takatsubo cardiomyopathy appearance on bedside echo 3/15 with apical hypo/akinesis. LVEF estimated 5-10%. Consider stunned myocardium immediately post-arrest vs viral myocarditis Cardiac arrest 3/15 5 mins, asystole > VF, shock > ST NSTEMI -elevated trop in ED P:  Tele MAP goal 60 (on levo 60, dobutamine 5, shock dose vaso) > can wean levo some now as MAP 100 with IABP assist IABP in place [facility] IVF Monitor CVP, i will place line neck, consider swan Check co-ox again now, may need inotrope support, repeat Lasix drip required Keep k greater 4, mag greater 2 Amiodarone as asystole/VF arrest  RENAL A:   Hyponatremia - likely due to ETOH. AKI. AGMA - lactate cleared NONAG noted HypoK, HypoMag P:   Lasix drip, unable to  oxygenate bmet q12h Start low rate bicarb for NONAG Electrolytes corrected by Pola Corn MD  GASTROINTESTINAL A:   GI prophylaxis. Nutrition. Shock liver likely, etoh P:   SUP: Pantoprazole. Start feeds Repeat lft in am   HEMATOLOGIC A:   VTE Prophylaxis. P:  SCD's / Heparin. CBC now and in AM  INFECTIOUS A:   Meningitis Sinusitis - as seen on CT head. Trichomonas - s/p treatment with flagyl on prior ED visit 03/11. P:   Abx as above (vanc / ceftriaxone) Narrow off vanc if when sens strep noted Assess RPR- pos HIV neg ID following decadron  ENDOCRINE A:   hyperglcyemia P:   Monitor glucose on BMP. Add ssi Steroids needed  Family updated: Daughter updated bedside by [facility] 3/15 PM  Interdisciplinary Family Meeting v Palliative Care Meeting:  Due by: 03/20.  CC time not including procedures: 35 mins  Joneen Roach, AGACNP-BC [facility]/Critical  Care Pager [phone] or [phone]  02/17/2016 9:09 PM

## 2016-02-17 NOTE — Progress Notes (Signed)
CRITICAL VALUE ALERT  Critical value received:  Gram + cocci in chains and in pairs in both aerobic and anaerobic sets  Date of notification:  3/15/172  Time of notification:  0500  Critical value read back:Yes.    Nurse who received alert:  Vilinda BlanksBrandy Pietrina Jagodzinski  MD notified (1st page):  Dr. Dellie CatholicSommers  Time of first page:    MD notified (2nd page):  Time of second page:  Responding MD:  Dr. Dellie CatholicSommers  Time MD responded:  0500

## 2016-02-17 NOTE — Progress Notes (Signed)
Rm 2M10 negative pressure verified with facilities management, pt initially treated as Airborne Precautions.

## 2016-02-17 NOTE — Progress Notes (Signed)
CRITICAL VALUE ALERT  Critical value received:  Calcium 6.2  Date of notification:  02/17/2016  Time of notification:  19:30  Critical value read back:Yes.    Nurse who received alert:  Bronson CurbHannah Padgett, RN  MD notified (1st page):  Elink  Time of first page:  20:15  Responding MD:  Marian SorrowSommer   Jeromiah Ohalloran A Caprisha Bridgett, RN 02/17/2016

## 2016-02-17 NOTE — Progress Notes (Signed)
Patient ID: Dale [name], male   DOB: 09/16/1966, 50 y.o.   MRN: [id number]         [facility]    Date of Admission:  02/16/2016           Day 1 vancomycin        Day 1 and ceftriaxone  Principal Problem:   Meningitis, streptococcal Active Problems:   Gram positive sepsis (HCC)   Septic shock (HCC)   Trichomonal urethritis in male   Homeless   Tobacco abuse   AKI (acute kidney injury) (HCC)   Encephalopathy, metabolic   Underweight   Cardiogenic shock (HCC)   Acute systolic heart failure (HCC)   Cardiac arrest (HCC)   Hydrocephalus   . antiseptic oral rinse  7 mL Mouth Rinse QID  . artificial tears  1 application Both Eyes 3 times per day  . aspirin  325 mg Oral Daily  . cefTRIAXone (ROCEPHIN)  IV  2 g Intravenous Q12H  . chlorhexidine gluconate  15 mL Mouth Rinse BID  . dexamethasone  10 mg Intravenous Q6H  . fentaNYL (SUBLIMAZE) injection  100 mcg Intravenous Once  . folic acid  1 mg Intravenous Daily  . heparin  5,000 Units Subcutaneous 3 times per day  . insulin aspart  0-15 Units Subcutaneous 6 times per day  . metoprolol  2.5 mg Intravenous 4 times per day  . midazolam  2 mg Intravenous Once  . pantoprazole (PROTONIX) IV  40 mg Intravenous Daily  . rocuronium  50 mg Intravenous Once  . thiamine  100 mg Intravenous Daily  . vancomycin  500 mg Intravenous Q12H   Review of Systems: Review of Systems  Unable to perform ROS: intubated    Past Medical History  Diagnosis Date  . Depression     Social History  Substance Use Topics  . Smoking status: Current Every Day Smoker -- 0.10 packs/day    Types: Cigarettes  . Smokeless tobacco: None  . Alcohol Use: Yes    No family history on file. No Known Allergies  OBJECTIVE: Filed Vitals:   02/17/16 0615 02/17/16 0630 02/17/16 0716 02/17/16 0839  BP: 92/77 93/74 108/72   Pulse: 67  136   Temp:    100.1 F (37.8 C)  TempSrc:    Rectal  Resp: 21 21 20    Height:      Weight:       SpO2: 96%  96%    Body mass index is 21.84 kg/(m^2).  Physical Exam  Constitutional:  He is sedated on the ventilator.  Eyes:  Conjunctival edema without hemorrhages.  Cardiovascular:  Very distant heart sounds.  Pulmonary/Chest: Breath sounds normal.  Abdominal: Soft. He exhibits no distension and no mass.  Musculoskeletal:  No acutely inflamed joints.  Lymphadenopathy:    He has no cervical adenopathy.    He has no axillary adenopathy.  Skin: No rash noted.    Lab Results Lab Results  Component Value Date   WBC 19.7* 02/17/2016   HGB 12.0* 02/17/2016   HCT 33.6* 02/17/2016   MCV 88.4 02/17/2016   PLT 136* 02/17/2016    Lab Results  Component Value Date   CREATININE 1.65* 02/17/2016   BUN 22* 02/17/2016   NA 134* 02/17/2016   K 4.4 02/17/2016   CL 106 02/17/2016   CO2 14* 02/17/2016    Lab Results  Component Value Date   ALT 45 02/17/2016   AST 174* 02/17/2016   ALKPHOS  39 02/17/2016   BILITOT 1.7* 02/17/2016     Microbiology: Recent Results (from the past 240 hour(s))  Urine culture     Status: None   Collection Time: 02/16/16 11:40 AM  Result Value Ref Range Status   Specimen Description URINE, CATHETERIZED  Final   Special Requests NONE  Final   Culture NO GROWTH 1 DAY  Final   Report Status 02/17/2016 FINAL  Final  Blood Culture (routine x 2)     Status: None (Preliminary result)   Collection Time: 02/16/16 11:53 AM  Result Value Ref Range Status   Specimen Description BLOOD RIGHT ANTECUBITAL  Final   Special Requests BOTTLES DRAWN AEROBIC AND ANAEROBIC 5CC  Final   Culture  Setup Time   Final    GRAM POSITIVE COCCI IN CHAINS IN PAIRS IN BOTH AEROBIC AND ANAEROBIC BOTTLES CRITICAL RESULT CALLED TO, READ BACK BY AND VERIFIED WITH: B WOODSON,RN  02/17/16 MKELLY    Culture TOO YOUNG TO READ  Final   Report Status PENDING  Incomplete  Blood Culture (routine x 2)     Status: None (Preliminary result)   Collection Time: 02/16/16 12:00 PM    Result Value Ref Range Status   Specimen Description BLOOD RIGHT HAND  Final   Special Requests BOTTLES DRAWN AEROBIC AND ANAEROBIC 5CC  Final   Culture  Setup Time   Final    GRAM POSITIVE COCCI IN CHAINS IN PAIRS IN BOTH AEROBIC AND ANAEROBIC BOTTLES CRITICAL RESULT CALLED TO, READ BACK BY AND VERIFIED WITH: B WOODSON,RN  02/17/16 MKELLY    Culture TOO YOUNG TO READ  Final   Report Status PENDING  Incomplete  CSF culture with Stat gram stain     Status: None (Preliminary result)   Collection Time: 02/16/16  4:44 PM  Result Value Ref Range Status   Specimen Description CSF  Final   Special Requests NONE  Final   Gram Stain   Final    WBC PRESENT,BOTH PMN AND MONONUCLEAR GRAM POSITIVE COCCI IN PAIRS Gram Stain Report Called to,Read Back By and Verified With: Tyrone Apple RN 1806 02/16/16 A BROWNING CYTOSPIN    Culture TOO YOUNG TO READ  Final   Report Status PENDING  Incomplete  MRSA PCR Screening     Status: None   Collection Time: 02/16/16 11:11 PM  Result Value Ref Range Status   MRSA by PCR NEGATIVE NEGATIVE Final    Comment:        The GeneXpert MRSA Assay (FDA approved for NASAL specimens only), is one component of a comprehensive MRSA colonization surveillance program. It is not intended to diagnose MRSA infection nor to guide or monitor treatment for MRSA infections.      ASSESSMENT: He has streptococcal bacteremia and meningitis. I will continue vancomycin and ceftriaxone pending final cultures and agree with continuing dexamethasone.  PLAN: 1. Continue vancomycin and ceftriaxone  Cliffton Asters, MD [facility] for Infectious Disease [facility] [phone] pager   [phone] cell 02/17/2016, 12:35 PM

## 2016-02-17 NOTE — Progress Notes (Signed)
UR Completed. Tiyah Zelenak, RN, BSN.  [phone] 

## 2016-02-17 NOTE — Progress Notes (Signed)
eLink Physician-Brief Progress Note Patient Name: Dale [name] DOB: 07/09/1966 MRN: [id number]   Date of Service  02/17/2016  HPI/Events of Note  Ca++ = 6.2 and Albumin = 1.7. Ca++ corrects to 8.04 given Albumin level.   eICU Interventions  Will replete Ca++.     Intervention Category Intermediate Interventions: Electrolyte abnormality - evaluation and management  Clancy Leiner Eugene 02/17/2016, 8:09 PM

## 2016-02-17 NOTE — Consult Note (Addendum)
Referring Physician: Dr. Rito Ehrlich Primary Physician: n/a Primary Cardiologist: none Reason for Consultation: VF arrest and elevated troponin   HPI:  Patient is a 50 yo AA M with only known past medical history of depression who presented 3/14 with fever, leukocytosis, and altered mental status. Lumbar puncture was performed in the ED was consistent with acute bacterial meningitis (Gram stain with GPCs).  Head CT was multifocal sinusitis and enlarged ventricles.  He was started on broad-spectrum antibiotics.  Of note, he was evaluated on 3/11 with URI symptoms.  He was treated for a trichomonas UTI and given a tamifl (no PCR performed at that time), but per report, he never took it.  In the ED, he was intubated for airway protection due to his altered mental status.  He became progressively hypotensive, requiring vasoactive medications.  He then went into asystole. ACLS was performed, got epi x1. On first rhythm check, was in ventricular fibrillation and received one 150 J shock with successful restoration of sinus rhythm.  Total code time was 5-6 minutes.  Around this time, it was found that his troponin was elevated to 7.6, which increased to 13 on repeat 4 hours later.    Review of Systems: unable to obtain   Past Medical History  Diagnosis Date  . Depression     Medications Prior to Admission  Medication Sig Dispense Refill  . Omega-3 Fatty Acids (FISH OIL) 1000 MG CAPS Take 1,000 mg by mouth every Friday.    . methocarbamol (ROBAXIN) 500 MG tablet Take 1 tablet (500 mg total) by mouth 2 (two) times daily. (Patient not taking: Reported on 07/30/2015) 20 tablet 0  . naproxen (NAPROSYN) 500 MG tablet Take 1 tablet (500 mg total) by mouth 2 (two) times daily. (Patient not taking: Reported on 07/30/2015) 30 tablet 0  . oseltamivir (TAMIFLU) 75 MG capsule Take 1 capsule (75 mg total) by mouth every 12 (twelve) hours. 10 capsule 0  . traMADol (ULTRAM) 50 MG tablet Take 1 tablet (50 mg  total) by mouth every 6 (six) hours as needed. (Patient not taking: Reported on 07/30/2015) 15 tablet 0     . ampicillin (OMNIPEN) IV  2 g Intravenous 3 times per day  . aspirin  325 mg Oral Daily  . calcium gluconate  1 g Intravenous Once  . cefTRIAXone (ROCEPHIN)  IV  2 g Intravenous Q12H  . fentaNYL (SUBLIMAZE) injection  50 mcg Intravenous Once  . folic acid  1 mg Intravenous Daily  . heparin  5,000 Units Subcutaneous 3 times per day  . metoprolol  2.5 mg Intravenous 4 times per day  . pantoprazole (PROTONIX) IV  40 mg Intravenous Daily  . potassium chloride  10 mEq Intravenous Q1 Hr x 4  . potassium chloride  40 mEq Oral Once  . thiamine  100 mg Intravenous Daily  . vancomycin  750 mg Intravenous Q24H    Infusions: . sodium chloride 100 mL/hr at 02/16/16 2002  . sodium chloride 75 mL/hr (02/17/16 0159)  . fentaNYL infusion INTRAVENOUS 50 mcg/hr (02/17/16 0247)  . norepinephrine (LEVOPHED) Adult infusion 40 mcg/min (02/17/16 0246)    No Known Allergies  Social History   Social History  . Marital Status: Legally Separated    Spouse Name: N/A  . Number of Children: N/A  . Years of Education: N/A   Occupational History  . Not on file.   Social History Main Topics  . Smoking status: Current Every Day Smoker -- 0.10 packs/day  Types: Cigarettes  . Smokeless tobacco: Not on file  . Alcohol Use: Yes  . Drug Use: Yes    Special: Marijuana  . Sexual Activity: Not on file   Other Topics Concern  . Not on file   Social History Narrative    No family history on file.  PHYSICAL EXAM: Filed Vitals:   02/17/16 0200 02/17/16 0215  BP: 112/92 112/99  Pulse:    Temp:    Resp: 31 29     Intake/Output Summary (Last 24 hours) at 02/17/16 0259 Last data filed at 02/16/16 2300  Gross per 24 hour  Intake 2114.3 ml  Output   1025 ml  Net 1089.3 ml    General:  Ill-appearing. Intubated. Sedated HEENT: ETT in place Neck: nuchal rigidity Cor: tachycardic, no  murmurs appreciated  Lungs: bibasilar crackles Abdomen: soft,nondistended.  Extremities: no cyanosis, clubbing, rash, edema Neuro: sedated, unresponsive  ECG: 02/16/16: sinus tachycardia, biatrial enlargement, and LVH  Results for orders placed or performed during the hospital encounter of 02/16/16 (from the past 24 hour(s))  CBG monitoring, ED     Status: Abnormal   Collection Time: 02/16/16 11:26 AM  Result Value Ref Range   Glucose-Capillary 134 (H) 65 - 99 mg/dL  Urinalysis, Routine w reflex microscopic (not at [facility])     Status: Abnormal   Collection Time: 02/16/16 11:40 AM  Result Value Ref Range   Color, Urine YELLOW YELLOW   APPearance CLOUDY (A) CLEAR   Specific Gravity, Urine 1.016 1.005 - 1.030   pH 5.5 5.0 - 8.0   Glucose, UA NEGATIVE NEGATIVE mg/dL   Hgb urine dipstick LARGE (A) NEGATIVE   Bilirubin Urine NEGATIVE NEGATIVE   Ketones, ur 15 (A) NEGATIVE mg/dL   Protein, ur 540 (A) NEGATIVE mg/dL   Nitrite NEGATIVE NEGATIVE   Leukocytes, UA TRACE (A) NEGATIVE  Urine rapid drug screen (hosp performed)     Status: Abnormal   Collection Time: 02/16/16 11:40 AM  Result Value Ref Range   Opiates NONE DETECTED NONE DETECTED   Cocaine NONE DETECTED NONE DETECTED   Benzodiazepines NONE DETECTED NONE DETECTED   Amphetamines NONE DETECTED NONE DETECTED   Tetrahydrocannabinol POSITIVE (A) NONE DETECTED   Barbiturates NONE DETECTED NONE DETECTED  Urine microscopic-add on     Status: Abnormal   Collection Time: 02/16/16 11:40 AM  Result Value Ref Range   Squamous Epithelial / LPF 0-5 (A) NONE SEEN   WBC, UA 6-30 0 - 5 WBC/hpf   RBC / HPF 0-5 0 - 5 RBC/hpf   Bacteria, UA FEW (A) NONE SEEN   Casts GRANULAR CAST (A) NEGATIVE   Urine-Other AMORPHOUS URATES/PHOSPHATES   Comprehensive metabolic panel     Status: Abnormal   Collection Time: 02/16/16 12:00 PM  Result Value Ref Range   Sodium 131 (L) 135 - 145 mmol/L   Potassium 4.1 3.5 - 5.1 mmol/L   Chloride 93 (L) 101 - 111  mmol/L   CO2 19 (L) 22 - 32 mmol/L   Glucose, Bld 132 (H) 65 - 99 mg/dL   BUN 17 6 - 20 mg/dL   Creatinine, Ser 9.81 (H) 0.61 - 1.24 mg/dL   Calcium 9.0 8.9 - 19.1 mg/dL   Total Protein 7.5 6.5 - 8.1 g/dL   Albumin 2.9 (L) 3.5 - 5.0 g/dL   AST 478 (H) 15 - 41 U/L   ALT 42 17 - 63 U/L   Alkaline Phosphatase 56 38 - 126 U/L   Total Bilirubin 1.1 0.3 -  1.2 mg/dL   GFR calc non Af Amer 46 (L) >60 mL/min   GFR calc Af Amer 54 (L) >60 mL/min   Anion gap 19 (H) 5 - 15  CBC WITH DIFFERENTIAL     Status: Abnormal   Collection Time: 02/16/16 12:00 PM  Result Value Ref Range   WBC 18.4 (H) 4.0 - 10.5 K/uL   RBC 4.32 4.22 - 5.81 MIL/uL   Hemoglobin 13.9 13.0 - 17.0 g/dL   HCT 16.1 (L) 09.6 - 04.5 %   MCV 87.7 78.0 - 100.0 fL   MCH 32.2 26.0 - 34.0 pg   MCHC 36.7 (H) 30.0 - 36.0 g/dL   RDW 40.9 81.1 - 91.4 %   Platelets 155 150 - 400 K/uL   Neutrophils Relative % 79 %   Neutro Abs 14.5 (H) 1.7 - 7.7 K/uL   Lymphocytes Relative 6 %   Lymphs Abs 1.1 0.7 - 4.0 K/uL   Monocytes Relative 16 %   Monocytes Absolute 2.9 (H) 0.1 - 1.0 K/uL   Eosinophils Relative 0 %   Eosinophils Absolute 0.0 0.0 - 0.7 K/uL   Basophils Relative 0 %   Basophils Absolute 0.0 0.0 - 0.1 K/uL  I-Stat CG4 Lactic Acid, ED  (not at  [facility])     Status: Abnormal   Collection Time: 02/16/16 12:09 PM  Result Value Ref Range   Lactic Acid, Venous 3.45 (HH) 0.5 - 2.0 mmol/L   Comment NOTIFIED PHYSICIAN   Ethanol     Status: None   Collection Time: 02/16/16 12:39 PM  Result Value Ref Range   Alcohol, Ethyl (B) <5 <5 mg/dL  CSF cell count with differential collection tube #: 1     Status: Abnormal   Collection Time: 02/16/16  4:39 PM  Result Value Ref Range   Tube # 1    Color, CSF QUANTITY NOT SUFFICIENT, UNABLE TO PERFORM TEST (A) COLORLESS   Appearance, CSF QUANTITY NOT SUFFICIENT, UNABLE TO PERFORM TEST (A) CLEAR   Supernatant QUANTITY NOT SUFFICIENT, UNABLE TO PERFORM TEST    RBC Count, CSF QUANTITY NOT  SUFFICIENT, UNABLE TO PERFORM TEST 0 /cu mm   WBC, CSF QUANTITY NOT SUFFICIENT, UNABLE TO PERFORM TEST 0 - 5 /cu mm   Segmented Neutrophils-CSF QUANTITY NOT SUFFICIENT, UNABLE TO PERFORM TEST 0 - 6 %   Lymphs, CSF QUANTITY NOT SUFFICIENT, UNABLE TO PERFORM TEST 40 - 80 %   Monocyte-Macrophage-Spinal Fluid QUANTITY NOT SUFFICIENT, UNABLE TO PERFORM TEST 15 - 45 %   Eosinophils, CSF QUANTITY NOT SUFFICIENT, UNABLE TO PERFORM TEST 0 - 1 %   Other Cells, CSF QUANTITY NOT SUFFICIENT, UNABLE TO PERFORM TEST   CSF cell count with differential collection tube #: 4     Status: Abnormal   Collection Time: 02/16/16  4:43 PM  Result Value Ref Range   Tube # 4    Color, CSF STRAW (A) COLORLESS   Appearance, CSF CLEAR CLEAR   Supernatant NOT INDICATED    RBC Count, CSF 89 (H) 0 /cu mm   WBC, CSF 340 (HH) 0 - 5 /cu mm   Segmented Neutrophils-CSF 88 (H) 0 - 6 %   Lymphs, CSF 6 (L) 40 - 80 %   Monocyte-Macrophage-Spinal Fluid 6 (L) 15 - 45 %   Eosinophils, CSF 0 0 - 1 %  Glucose, CSF     Status: Abnormal   Collection Time: 02/16/16  4:44 PM  Result Value Ref Range   Glucose, CSF <20 (LL)  40 - 70 mg/dL  CSF culture with Stat gram stain     Status: None (Preliminary result)   Collection Time: 02/16/16  4:44 PM  Result Value Ref Range   Specimen Description CSF    Special Requests NONE    Gram Stain      WBC PRESENT,BOTH PMN AND MONONUCLEAR GRAM POSITIVE COCCI IN PAIRS Gram Stain Report Called to,Read Back By and Verified With: N Zonia Kief RN [id number] 02/16/16 A BROWNING CYTOSPIN    Culture PENDING    Report Status PENDING   Protein, CSF     Status: Abnormal   Collection Time: 02/16/16  4:44 PM  Result Value Ref Range   Total  Protein, CSF >600 (H) 15 - 45 mg/dL  I-Stat CG4 Lactic Acid, ED  (not at  [facility])     Status: Abnormal   Collection Time: 02/16/16  5:17 PM  Result Value Ref Range   Lactic Acid, Venous 2.11 (HH) 0.5 - 2.0 mmol/L   Comment NOTIFIED PHYSICIAN   Lactic acid, plasma     Status:  None   Collection Time: 02/16/16  8:06 PM  Result Value Ref Range   Lactic Acid, Venous 1.5 0.5 - 2.0 mmol/L  Triglycerides     Status: None   Collection Time: 02/16/16  8:16 PM  Result Value Ref Range   Triglycerides 54 <150 mg/dL  CBC     Status: Abnormal   Collection Time: 02/16/16  8:16 PM  Result Value Ref Range   WBC 16.9 (H) 4.0 - 10.5 K/uL   RBC 4.19 (L) 4.22 - 5.81 MIL/uL   Hemoglobin 12.8 (L) 13.0 - 17.0 g/dL   HCT 16.1 (L) 09.6 - 04.5 %   MCV 87.6 78.0 - 100.0 fL   MCH 30.5 26.0 - 34.0 pg   MCHC 34.9 30.0 - 36.0 g/dL   RDW 40.9 81.1 - 91.4 %   Platelets 137 (L) 150 - 400 K/uL  Creatinine, serum     Status: Abnormal   Collection Time: 02/16/16  8:16 PM  Result Value Ref Range   Creatinine, Ser 1.28 (H) 0.61 - 1.24 mg/dL   GFR calc non Af Amer >60 >60 mL/min   GFR calc Af Amer >60 >60 mL/min  Troponin I     Status: Abnormal   Collection Time: 02/16/16  8:16 PM  Result Value Ref Range   Troponin I 7.59 (HH) <0.031 ng/mL  I-Stat arterial blood gas, ED     Status: Abnormal   Collection Time: 02/16/16  8:33 PM  Result Value Ref Range   pH, Arterial 7.341 (L) 7.350 - 7.450   pCO2 arterial 30.9 (L) 35.0 - 45.0 mmHg   pO2, Arterial 146.0 (H) 80.0 - 100.0 mmHg   Bicarbonate 16.2 (L) 20.0 - 24.0 mEq/L   TCO2 17 0 - 100 mmol/L   O2 Saturation 99.0 %   Acid-base deficit 7.0 (H) 0.0 - 2.0 mmol/L   Patient temperature 104.0 F    Collection site RADIAL, ALLEN'S TEST ACCEPTABLE    Drawn by Operator    Sample type ARTERIAL   I-STAT 3, arterial blood gas (G3+)     Status: Abnormal   Collection Time: 02/17/16 12:12 AM  Result Value Ref Range   pH, Arterial 7.265 (L) 7.350 - 7.450   pCO2 arterial 30.7 (L) 35.0 - 45.0 mmHg   pO2, Arterial 91.0 80.0 - 100.0 mmHg   Bicarbonate 13.8 (L) 20.0 - 24.0 mEq/L   TCO2 15 0 - 100 mmol/L  O2 Saturation 95.0 %   Acid-base deficit 12.0 (H) 0.0 - 2.0 mmol/L   Patient temperature 100.1 F    Collection site RADIAL, ALLEN'S TEST ACCEPTABLE     Drawn by Operator    Sample type ARTERIAL   Lactic acid, plasma     Status: Abnormal   Collection Time: 02/17/16 12:30 AM  Result Value Ref Range   Lactic Acid, Venous 4.9 (HH) 0.5 - 2.0 mmol/L  Magnesium     Status: None   Collection Time: 02/17/16 12:33 AM  Result Value Ref Range   Magnesium 2.2 1.7 - 2.4 mg/dL  Phosphorus     Status: None   Collection Time: 02/17/16 12:33 AM  Result Value Ref Range   Phosphorus 4.6 2.5 - 4.6 mg/dL  Comprehensive metabolic panel     Status: Abnormal   Collection Time: 02/17/16 12:33 AM  Result Value Ref Range   Sodium 141 135 - 145 mmol/L   Potassium 3.0 (L) 3.5 - 5.1 mmol/L   Chloride 106 101 - 111 mmol/L   CO2 17 (L) 22 - 32 mmol/L   Glucose, Bld 145 (H) 65 - 99 mg/dL   BUN 16 6 - 20 mg/dL   Creatinine, Ser 1.61 (H) 0.61 - 1.24 mg/dL   Calcium 6.5 (L) 8.9 - 10.3 mg/dL   Total Protein 5.0 (L) 6.5 - 8.1 g/dL   Albumin 1.7 (L) 3.5 - 5.0 g/dL   AST 096 (H) 15 - 41 U/L   ALT 45 17 - 63 U/L   Alkaline Phosphatase 39 38 - 126 U/L   Total Bilirubin 1.7 (H) 0.3 - 1.2 mg/dL   GFR calc non Af Amer 40 (L) >60 mL/min   GFR calc Af Amer 47 (L) >60 mL/min   Anion gap 18 (H) 5 - 15  Troponin I     Status: Abnormal   Collection Time: 02/17/16 12:33 AM  Result Value Ref Range   Troponin I 13.02 (HH) <0.031 ng/mL  CK     Status: Abnormal   Collection Time: 02/17/16 12:33 AM  Result Value Ref Range   Total CK 645 (H) 49 - 397 U/L  CBC     Status: Abnormal   Collection Time: 02/17/16 12:35 AM  Result Value Ref Range   WBC 15.4 (H) 4.0 - 10.5 K/uL   RBC 3.66 (L) 4.22 - 5.81 MIL/uL   Hemoglobin 11.0 (L) 13.0 - 17.0 g/dL   HCT 04.5 (L) 40.9 - 81.1 %   MCV 89.1 78.0 - 100.0 fL   MCH 30.1 26.0 - 34.0 pg   MCHC 33.7 30.0 - 36.0 g/dL   RDW 91.4 78.2 - 95.6 %   Platelets 120 (L) 150 - 400 K/uL  Protime-INR     Status: None   Collection Time: 02/17/16 12:53 AM  Result Value Ref Range   Prothrombin Time 14.8 11.6 - 15.2 seconds   INR 1.14 0.00 -  1.49  Procalcitonin     Status: None   Collection Time: 02/17/16 12:53 AM  Result Value Ref Range   Procalcitonin 73.53 ng/mL  APTT     Status: Abnormal   Collection Time: 02/17/16 12:53 AM  Result Value Ref Range   aPTT 49 (H) 24 - 37 seconds  Cortisol     Status: None   Collection Time: 02/17/16 12:54 AM  Result Value Ref Range   Cortisol, Plasma 15.2 ug/dL  Type and screen [facility]     Status: None   Collection  Time: 02/17/16  1:17 AM  Result Value Ref Range   ABO/RH(D) B POS    Antibody Screen NEG    Sample Expiration 02/20/2016   ABO/Rh     Status: None (Preliminary result)   Collection Time: 02/17/16  1:17 AM  Result Value Ref Range   ABO/RH(D) B POS   Carboxyhemoglobin     Status: Abnormal   Collection Time: 02/17/16  1:31 AM  Result Value Ref Range   Total hemoglobin 11.4 (L) 13.5 - 18.0 g/dL   O2 Saturation 40.9 %   Carboxyhemoglobin 1.0 0.5 - 1.5 %   Methemoglobin 1.1 0.0 - 1.5 %   Ct Head Wo Contrast  02/16/2016  CLINICAL DATA:  Fever and altered mental status; tremors EXAM: CT HEAD WITHOUT CONTRAST TECHNIQUE: Contiguous axial images were obtained from the base of the skull through the vertex without intravenous contrast. COMPARISON:  June 01, 2009 FINDINGS: The ventricles are mildly enlarged in a generalized manner and appear larger compared to the prior study. The sulci appear unremarkable. There is no intracranial mass, hemorrhage, extra-axial fluid collection, or midline shift. No focal gray-white compartment lesions are identified. No evidence of acute infarct. Bony calvarium appears intact. The mastoid air cells are clear. There is opacification in the left maxillary antrum as well as in multiple ethmoid air cells bilaterally. There is mild mucosal thickening in the left sphenoid sinus region. No intraorbital lesions are apparent. IMPRESSION: Ventricles mildly enlarged with sulci appearing normal. This appearance potentially could represent a  degree of normal pressure hydrocephalus. This finding also could be secondary to prior or recent meningitis meningitis. No intracranial mass, hemorrhage, or extra-axial fluid collection. No acute infarct evident. Multifocal sinusitis. Given these current findings, brain MRI pre and post-contrast may be helpful to further assess, in particular to further evaluate the meningeal regions. Electronically Signed   By: Bretta Bang III M.D.   On: 02/16/2016 14:04   Dg Chest Portable 1 View  02/16/2016  CLINICAL DATA:  Endotracheal tube placement EXAM: PORTABLE CHEST 1 VIEW COMPARISON:  Chest x-ray from earlier same day. FINDINGS: Endotracheal tube well positioned with tip approximately 2 cm above the carina. Enteric tube passes below the diaphragm. Heart size is normal. There is central pulmonary vascular congestion and mild interstitial edema. Suspect small left pleural effusion. No pneumothorax. Osseous structures about the chest are unremarkable. IMPRESSION: 1. Endotracheal tube well positioned with tip approximately 2 cm above the carina. 2. Central pulmonary vascular congestion and mild bilateral interstitial edema suggesting mild volume overload/resuscitation efforts. No frank alveolar pulmonary edema. Electronically Signed   By: Bary Richard M.D.   On: 02/16/2016 19:38   Dg Chest Port 1 View  02/16/2016  CLINICAL DATA:  Altered mental status.  Depression. EXAM: PORTABLE CHEST 1 VIEW COMPARISON:  02/13/2016. FINDINGS: Midline trachea. Normal heart size and mediastinal contours. Left costophrenic angle exclusion. No pleural effusion or pneumothorax. Clear lungs. IMPRESSION: No active disease. Electronically Signed   By: Jeronimo Greaves M.D.   On: 02/16/2016 13:48     ASSESSMENT/PLAN:  1. Cardiac Arrest. Initial rhythm was asystole, which converted to VF after compressions and epinephrine x1. Now sinus tachycardia after successful defibrillation. Arrest was likely due to hypotension and not a primary ACS  event.  2. Shock. Multifactorial, vasodilatory due to severe infection and cardiogenic.  Agree with central line placement. -- Recommend check VBG, lactate -- Trend CVP -- continue vasoactive medications, currently on levophed, if additional support needed, consider dopamine  3. Cardiomyopathy. Bedside  echo reveals severe biventricular failure, LVEF ~10% and moderate RV failure.  Differential includes viral myocarditis vs stunned myocardium after defibrillation vs stress-induced cardiomyopathy.  Agree with levophed for BP support.  Would be cautious with IVF resuscitation given severe cardiomyopathy, hypoxia, and CXR with pulmonary edema.  If additional vasoactive medication needed, consider dopamine. -- order formal echocardiogram  4. Elevated troponin, likely demand in the setting of severe infection and shock. -- Continue to trend -- Repeat EKG -- Will not pursue ischemic evaluation in the setting of acute bacterial meningitis, but should have evaluation prior to discharge

## 2016-02-17 NOTE — Procedures (Signed)
Central Venous Catheter Insertion Procedure Note Dale DemarkGraig D [name] [id number] 05/21/1966  Procedure: Insertion of Central Venous Catheter Indications: Assessment of intravascular volume, Drug and/or fluid administration and Frequent blood sampling  Procedure Details Consent: Unable to obtain consent because of emergent medical necessity. Time Out: Verified patient identification, verified procedure, site/side was marked, verified correct patient position, special equipment/implants available, medications/allergies/relevent history reviewed, required imaging and test results available.  Performed  Maximum sterile technique was used including antiseptics, cap, gloves, gown, hand hygiene, mask and sheet. Skin prep: Chlorhexidine; local anesthetic administered A antimicrobial bonded/coated triple lumen catheter was placed in the right femoral vein due to emergent situation using the Seldinger technique. Ultrasound guidance used.Yes.   Catheter placed to 22 cm. Blood aspirated via all 3 ports and then flushed x 3. Line sutured x 2 and dressing applied.  Evaluation Blood flow good Complications: No apparent complications Patient did tolerate procedure well. Chest X-ray ordered to verify placement.  CXR: n/a  Joneen RoachPaul Satya Bohall, AGACNP-BC [facility] Pager [phone] or [phone]  02/17/2016 12:49 AM

## 2016-02-17 NOTE — Progress Notes (Addendum)
PULMONARY / CRITICAL CARE MEDICINE   Name: Dale [name] MRN: [id number] DOB: 07/15/1966    ADMISSION DATE:  02/16/2016 CONSULTATION DATE:  02/16/16  REFERRING MD:  Rito EhrlichKrishnan  CHIEF COMPLAINT:  AMS  HISTORY OF PRESENT ILLNESS:  Pt is encephelopathic; therefore, this HPI is obtained from chart review. Dale [name] is a 50 y.o. male with PMH as outlined below. [facility] with URI symptoms.  He was discharged with Tamiflu given concern for flu (no PCR done).  He was also found to have trichomonas for which he was treated with flagyl.  He apparently did not fill RX for tamiflu. On 03/14, family went to see him and when they got to his home, he told them that he was feeling worse and family felt that he was altered.  Family felt he was not himself so brought him to ED for further evaluation. In ED, he was noted to have leukocytosis, fever to 103, initial lactate 3.  He had CT of the head which showed enlarged ventricles concerning for meningitis.  He then had LP performed in ED which showed elevated WBC's as well as GPC's on gram stain.  He was started on vanc / ceftriaxone / ampicillin and [facility] was initially called for admission.While in ED, he had decrease in level of consciousness to the point he became minimally responsive.  He was subsequently intubated for airway protection. He was admitted to ICU where he was initially stable, but unfortunately he suffered a precipitous drop in BP, which resulted in cardiac arrest. Total downtime 5 mins. Asystole >VF>sinus tach. Requiring 50 mcg levophed to keep MAP up.   PAST MEDICAL HISTORY :  He  has a past medical history of Depression.  PAST SURGICAL HISTORY: He  has past surgical history that includes Ankle surgery.  No Known Allergies  No current facility-administered medications on file prior to encounter.   Current Outpatient Prescriptions on File Prior to Encounter  Medication Sig  . Omega-3 Fatty Acids (FISH OIL)  1000 MG CAPS Take 1,000 mg by mouth every Friday.  . methocarbamol (ROBAXIN) 500 MG tablet Take 1 tablet (500 mg total) by mouth 2 (two) times daily. (Patient not taking: Reported on 07/30/2015)  . naproxen (NAPROSYN) 500 MG tablet Take 1 tablet (500 mg total) by mouth 2 (two) times daily. (Patient not taking: Reported on 07/30/2015)  . oseltamivir (TAMIFLU) 75 MG capsule Take 1 capsule (75 mg total) by mouth every 12 (twelve) hours.  . traMADol (ULTRAM) 50 MG tablet Take 1 tablet (50 mg total) by mouth every 6 (six) hours as needed. (Patient not taking: Reported on 07/30/2015)    FAMILY HISTORY:  His has no family status information on file.   SOCIAL HISTORY: He  reports that he has been smoking Cigarettes.  He has been smoking about 0.10 packs per day. He does not have any smokeless tobacco history on file. He reports that he drinks alcohol. He reports that he uses illicit drugs (Marijuana).  REVIEW OF SYSTEMS:   Unable to obtain as pt is encephalopathic.  SUBJECTIVE:  On vent, unresponsive.  VITAL SIGNS: BP 97/85 mmHg  Pulse 154  Temp(Src) 104.7 F (40.4 C) (Core (Comment))  Resp 30  Ht 5' 8.9" (1.75 m)  Wt 47.628 kg (105 lb)  BMI 15.55 kg/m2  SpO2 95%  HEMODYNAMICS:    VENTILATOR SETTINGS: Vent Mode:  [-] PRVC FiO2 (%):  [100 %] 100 % Set Rate:  [16 bmp] 16 bmp  Vt Set:  [600 mL] 600 mL PEEP:  [5 cmH20-10 cmH20] 10 cmH20 Plateau Pressure:  [18 cmH20-25 cmH20] 21 cmH20  INTAKE / OUTPUT:    PHYSICAL EXAMINATION: General: Adult male, thin, unresponsive on vent Neuro: Comatose HEENT: [place]/AT. PERRL, sclerae anicteric. Cardiovascular: Tachy, regular, no M/R/G.  Lungs: Respirations even and unlabored.  Coarse bilaterally. Abdomen: BS x 4, soft, NT/ND.  Musculoskeletal: No gross deformities, no edema.  Skin: Intact, dry, warm, no rashes.  LABS:  BMET  Recent Labs Lab 02/13/16 1753 02/16/16 1200 02/16/16 2016  NA 127* 131*  --   K 3.7 4.1  --   CL 96* 93*  --    CO2 18* 19*  --   BUN 27* 17  --   CREATININE 1.84* 1.66* 1.28*  GLUCOSE 100* 132*  --     Electrolytes  Recent Labs Lab 02/13/16 1753 02/16/16 1200  CALCIUM 7.7* 9.0    CBC  Recent Labs Lab 02/16/16 1200 02/16/16 2016 02/17/16 0035  WBC 18.4* 16.9* 15.4*  HGB 13.9 12.8* 11.0*  HCT 37.9* 36.7* 32.6*  PLT 155 137* 120*    Coag's No results for input(s): APTT, INR in the last 168 hours.  Sepsis Markers  Recent Labs Lab 02/16/16 1209 02/16/16 1717 02/16/16 2006  LATICACIDVEN 3.45* 2.11* 1.5    ABG  Recent Labs Lab 02/16/16 2033 02/17/16 0012  PHART 7.341* 7.265*  PCO2ART 30.9* 30.7*  PO2ART 146.0* 91.0    Liver Enzymes  Recent Labs Lab 02/16/16 1200  AST 117*  ALT 42  ALKPHOS 56  BILITOT 1.1  ALBUMIN 2.9*    Cardiac Enzymes  Recent Labs Lab 02/13/16 1753 02/16/16 2016  TROPONINI <0.03 7.59*    Glucose  Recent Labs Lab 02/16/16 1126  GLUCAP 134*    Imaging Ct Head Wo Contrast  02/16/2016  CLINICAL DATA:  Fever and altered mental status; tremors EXAM: CT HEAD WITHOUT CONTRAST TECHNIQUE: Contiguous axial images were obtained from the base of the skull through the vertex without intravenous contrast. COMPARISON:  June 01, 2009 FINDINGS: The ventricles are mildly enlarged in a generalized manner and appear larger compared to the prior study. The sulci appear unremarkable. There is no intracranial mass, hemorrhage, extra-axial fluid collection, or midline shift. No focal gray-white compartment lesions are identified. No evidence of acute infarct. Bony calvarium appears intact. The mastoid air cells are clear. There is opacification in the left maxillary antrum as well as in multiple ethmoid air cells bilaterally. There is mild mucosal thickening in the left sphenoid sinus region. No intraorbital lesions are apparent. IMPRESSION: Ventricles mildly enlarged with sulci appearing normal. This appearance potentially could represent a degree of  normal pressure hydrocephalus. This finding also could be secondary to prior or recent meningitis meningitis. No intracranial mass, hemorrhage, or extra-axial fluid collection. No acute infarct evident. Multifocal sinusitis. Given these current findings, brain MRI pre and post-contrast may be helpful to further assess, in particular to further evaluate the meningeal regions. Electronically Signed   By: Bretta Bang III M.D.   On: 02/16/2016 14:04   Dg Chest Portable 1 View  02/16/2016  CLINICAL DATA:  Endotracheal tube placement EXAM: PORTABLE CHEST 1 VIEW COMPARISON:  Chest x-ray from earlier same day. FINDINGS: Endotracheal tube well positioned with tip approximately 2 cm above the carina. Enteric tube passes below the diaphragm. Heart size is normal. There is central pulmonary vascular congestion and mild interstitial edema. Suspect small left pleural effusion. No pneumothorax. Osseous structures about the chest are  unremarkable. IMPRESSION: 1. Endotracheal tube well positioned with tip approximately 2 cm above the carina. 2. Central pulmonary vascular congestion and mild bilateral interstitial edema suggesting mild volume overload/resuscitation efforts. No frank alveolar pulmonary edema. Electronically Signed   By: Bary Richard M.D.   On: 02/16/2016 19:38   Dg Chest Port 1 View  02/16/2016  CLINICAL DATA:  Altered mental status.  Depression. EXAM: PORTABLE CHEST 1 VIEW COMPARISON:  02/13/2016. FINDINGS: Midline trachea. Normal heart size and mediastinal contours. Left costophrenic angle exclusion. No pleural effusion or pneumothorax. Clear lungs. IMPRESSION: No active disease. Electronically Signed   By: Jeronimo Greaves M.D.   On: 02/16/2016 13:48     STUDIES:  CXR 03/14 > central vascular congestion. CT head 03/14 > mildly enlarged ventricles with normal sulci.  Could potentially represent NPH or meningitis.  Multifocal sinusitis. 3/15 early AM > Bedside echo by cardiology fellow noted very low  LVEF likely 10%. RV down as well, but now quite as bad.   CULTURES: Blood 03/14 > Urine 03/14 > CSF 03/14 > 340 WBC's (88 neut), < 20 glucose, GPC's in pairs >   ANTIBIOTICS: Vanc 03/14 > Ceftriaxone 03/14 > Ampicillin 03/14 >  SIGNIFICANT EVENTS: 03/14 > admitted with meningitis.  Required intubation for airway protection due to decline in mental status. 3/15 early AM > Cardiac arrest, 5 mins, Asystole > VF > ST  LINES/TUBES: ETT 03/14 > CVL R fem 3/15 (placed emergently, but under full sterile precautions) Art line R fem 3/15 (placed emergently, but under full sterile precautions)  DISCUSSION: 50 y.o. M admitted 03/14 for AMS secondary to meningitis.  He required intubation in ED for decline in mental status. Suffered cardiac arrest 3/15 early AM, ROSC after 5 mins. Requiring high dose levophed.  ASSESSMENT / PLAN:  NEUROLOGIC A:   Acute metabolic encephalopathy - presumed due to meningitis.  S/p LP in ED 03/14. ETOH use disorder. Polysubstance abuse - UDS positive for THC. P:   Sedation:  Versed / Fentanyl PRN. RASS goal: 0 to -1. Daily WUA. Thiamine / Folate. Neurology following, recommend MRI in AM Substance abuse counseling once extubated.  PULMONARY A: VDRF - due to inability to protect airway in the setting of worsening mental status. Tobacco use disorder. P:   Full vent support. Wean as able. VAP prevention measures. SBT in AM if able. Albuterol PRN. CXR in AM. Tobacco cessation counseling once extubated.  CARDIOVASCULAR A:  Shock - septic + cardiogenic  Sinus tachycardia - likely combination of sepsis + agitation post intubation. Acute systolic heart failure - Takatsubo cardiomyopathy appearance on bedside echo with apical hypo/akinesis. LVEF estimated 5-10%. Consider stunned myocardium immediately post-arrest vs viral myocarditis Cardiac arrest 3/15 5 mins, asystole > VF, shock > ST NSTEMI -elevated trop in ED P:  Telemetry monitoring MAP Goal >  Maintaining on levo for now May need more positive inotrope like epi or dopa as next line pressor KVO IVF Monitor CVP Check co-ox, lactic Continue to trend troponin Formal echo and cards consult in AM  RENAL A:   Hyponatremia - likely due to ETOH. AKI. AGMA - lactate. P:   NS @ 100. BMP now and in AM  GASTROINTESTINAL A:   GI prophylaxis. Nutrition. P:   SUP: Pantoprazole. NPO.  HEMATOLOGIC A:   VTE Prophylaxis. P:  SCD's / Heparin. CBC now and in AM  INFECTIOUS A:   Meningitis - presumed bacterial given preliminary CSF studies. Sinusitis - as seen on CT head. Trichomonas -  s/p treatment with flagyl on prior ED visit 03/11. P:   Abx as above (vanc / ampicillin / ceftriaxone).  Follow cultures as above. PCT algorithm to limit abx exposure. Assess RPR, HIV. ID following Septic shock protocol initiated  ENDOCRINE A:   No acute issues.   P:   Monitor glucose on BMP.  Family updated: Family update at bedside. Wife (separated 21 years but still married). Made aware prognosis guarded, may not survive. Continue full code  Interdisciplinary Family Meeting v Palliative Care Meeting:  Due by: 03/20.  CC time not including procedures: 40 minutes.  Joneen Roach, AGACNP-BC [facility] Pager [phone] or [phone]  02/17/2016 1:17 AM   ATTENDING NOTE: I have personally reviewed patient's available data, including medical history, events of note, physical examination and test results as part of my evaluation. I have discussed with resident/NP and other careteam providers such as pharmacist, RN and RRT & co-ordinated with consultants.   He developed PEA arrest and V. fib requiring shock and epinephrine 2. He was on Levophed and 50 mics and this has now been tapered to 30 mics. Bedside echo showed severe cardiomyopathy with an EF of about 10%, troponins were positive before the arrest. Hypokalemia is being repleted this  morning ABG shows metabolic acidosis which is compensated, he remains hypoxic on PEEP of 10 and FiO2 100%, renal function is worsening but urine output is reasonable  Will add vasopressin and amiodarone since he is high risk for recurrence of ventricular arrhythmias Rest per NP/medical resident whose note is outlined above and that I agree with and edited in full.    Additional critical care time x 45 m  Cyril Mourning MD. FCCP. [facility] Pager [phone] If no response call [phone]   02/17/2016

## 2016-02-17 NOTE — ED Provider Notes (Signed)
CSN: [id number]     Arrival date & time 02/16/16  1108 History   First MD Initiated Contact with Patient 02/16/16 1109     Chief Complaint  Patient presents with  . Altered Mental Status     (Consider location/radiation/quality/duration/timing/severity/associated sxs/prior Treatment) Patient is a 50 y.o. male presenting with altered mental status.  Altered Mental Status  Please see Asher Muir Ward PA-C note for history/physical/MDM. This is procedure note for LP.   Past Medical History  Diagnosis Date  . Depression    Past Surgical History  Procedure Laterality Date  . Ankle surgery     No family history on file. Social History  Substance Use Topics  . Smoking status: Current Every Day Smoker -- 0.10 packs/day    Types: Cigarettes  . Smokeless tobacco: None  . Alcohol Use: Yes    Review of Systems    Allergies  Review of patient's allergies indicates no known allergies.  Home Medications   Prior to Admission medications   Medication Sig Start Date End Date Taking? Authorizing Provider  Omega-3 Fatty Acids (FISH OIL) 1000 MG CAPS Take 1,000 mg by mouth every Friday.   Yes Historical Provider, MD  methocarbamol (ROBAXIN) 500 MG tablet Take 1 tablet (500 mg total) by mouth 2 (two) times daily. Patient not taking: Reported on 07/30/2015 08/25/14   Tatyana Kirichenko, PA-C  naproxen (NAPROSYN) 500 MG tablet Take 1 tablet (500 mg total) by mouth 2 (two) times daily. Patient not taking: Reported on 07/30/2015 08/25/14   Jaynie Crumble, PA-C  oseltamivir (TAMIFLU) 75 MG capsule Take 1 capsule (75 mg total) by mouth every 12 (twelve) hours. 02/13/16   Melton Krebs, PA-C  traMADol (ULTRAM) 50 MG tablet Take 1 tablet (50 mg total) by mouth every 6 (six) hours as needed. Patient not taking: Reported on 07/30/2015 08/25/14   Tatyana Kirichenko, PA-C   BP 108/72 mmHg  Pulse 136  Temp(Src) 100.1 F (37.8 C) (Rectal)  Resp 20  Ht 5' 8.9" (1.75 m)  Wt 147 lb 7.8 oz (66.9 kg)   BMI 21.84 kg/m2  SpO2 96% Physical Exam  ED Course  .Lumbar Puncture Date/Time: 02/17/2016 11:56 AM Performed by: Alvira Monday Authorized by: Alvira Monday Consent: The procedure was performed in an emergent situation. Verbal consent not obtained. Written consent not obtained. Required items: required blood products, implants, devices, and special equipment available Patient identity confirmed: arm band Time out: Immediately prior to procedure a "time out" was called to verify the correct patient, procedure, equipment, support staff and site/side marked as required. Indications: evaluation for infection and evaluation for altered mental status Local anesthetic: lidocaine 1% with epinephrine Anesthetic total: 8 ml Patient sedated: yes Sedation type: anxiolysis Sedatives: haloperidol Analgesia: fentanyl Preparation: Patient was prepped and draped in the usual sterile fashion. Patient's position: right lateral decubitus Number of attempts: 1 Fluid appearance: cloudy Tubes of fluid: 4 Total volume: 2.5 ml Post-procedure: site cleaned and adhesive bandage applied Patient tolerance: Patient tolerated the procedure well with no immediate complications   (including critical care time) Labs Review Labs Reviewed  COMPREHENSIVE METABOLIC PANEL - Abnormal; Notable for the following:    Sodium 131 (*)    Chloride 93 (*)    CO2 19 (*)    Glucose, Bld 132 (*)    Creatinine, Ser 1.66 (*)    Albumin 2.9 (*)    AST 117 (*)    GFR calc non Af Amer 46 (*)    GFR calc Af Amer 54 (*)  Anion gap 19 (*)    All other components within normal limits  CBC WITH DIFFERENTIAL/PLATELET - Abnormal; Notable for the following:    WBC 18.4 (*)    HCT 37.9 (*)    MCHC 36.7 (*)    Neutro Abs 14.5 (*)    Monocytes Absolute 2.9 (*)    All other components within normal limits  URINALYSIS, ROUTINE W REFLEX MICROSCOPIC (NOT AT [facility]) - Abnormal; Notable for the following:    APPearance CLOUDY (*)     Hgb urine dipstick LARGE (*)    Ketones, ur 15 (*)    Protein, ur 100 (*)    Leukocytes, UA TRACE (*)    All other components within normal limits  URINE RAPID DRUG SCREEN, HOSP PERFORMED - Abnormal; Notable for the following:    Tetrahydrocannabinol POSITIVE (*)    All other components within normal limits  URINE MICROSCOPIC-ADD ON - Abnormal; Notable for the following:    Squamous Epithelial / LPF 0-5 (*)    Bacteria, UA FEW (*)    Casts GRANULAR CAST (*)    All other components within normal limits  CSF CELL COUNT WITH DIFFERENTIAL - Abnormal; Notable for the following:    Color, CSF QUANTITY NOT SUFFICIENT, UNABLE TO PERFORM TEST (*)    Appearance, CSF QUANTITY NOT SUFFICIENT, UNABLE TO PERFORM TEST (*)    All other components within normal limits  CSF CELL COUNT WITH DIFFERENTIAL - Abnormal; Notable for the following:    Color, CSF STRAW (*)    RBC Count, CSF 89 (*)    WBC, CSF 340 (*)    Segmented Neutrophils-CSF 88 (*)    Lymphs, CSF 6 (*)    Monocyte-Macrophage-Spinal Fluid 6 (*)    All other components within normal limits  GLUCOSE, CSF - Abnormal; Notable for the following:    Glucose, CSF <20 (*)    All other components within normal limits  PROTEIN, CSF - Abnormal; Notable for the following:    Total  Protein, CSF >600 (*)    All other components within normal limits  CBC - Abnormal; Notable for the following:    WBC 16.9 (*)    RBC 4.19 (*)    Hemoglobin 12.8 (*)    HCT 36.7 (*)    Platelets 137 (*)    All other components within normal limits  CREATININE, SERUM - Abnormal; Notable for the following:    Creatinine, Ser 1.28 (*)    All other components within normal limits  CBC - Abnormal; Notable for the following:    WBC 19.7 (*)    RBC 3.80 (*)    Hemoglobin 12.0 (*)    HCT 33.6 (*)    Platelets 136 (*)    All other components within normal limits  BASIC METABOLIC PANEL - Abnormal; Notable for the following:    Sodium 134 (*)    CO2 14 (*)     Glucose, Bld 309 (*)    BUN 22 (*)    Creatinine, Ser 1.65 (*)    Calcium 6.5 (*)    GFR calc non Af Amer 47 (*)    GFR calc Af Amer 54 (*)    All other components within normal limits  BLOOD GAS, ARTERIAL - Abnormal; Notable for the following:    pCO2 arterial 22.2 (*)    Bicarbonate 12.9 (*)    Acid-base deficit 11.2 (*)    All other components within normal limits  TROPONIN I - Abnormal; Notable for the  following:    Troponin I 7.59 (*)    All other components within normal limits  TROPONIN I - Abnormal; Notable for the following:    Troponin I 4.74 (*)    All other components within normal limits  CBC - Abnormal; Notable for the following:    WBC 15.4 (*)    RBC 3.66 (*)    Hemoglobin 11.0 (*)    HCT 32.6 (*)    Platelets 120 (*)    All other components within normal limits  COMPREHENSIVE METABOLIC PANEL - Abnormal; Notable for the following:    Potassium 3.0 (*)    CO2 17 (*)    Glucose, Bld 145 (*)    Creatinine, Ser 1.86 (*)    Calcium 6.5 (*)    Total Protein 5.0 (*)    Albumin 1.7 (*)    AST 174 (*)    Total Bilirubin 1.7 (*)    GFR calc non Af Amer 40 (*)    GFR calc Af Amer 47 (*)    Anion gap 18 (*)    All other components within normal limits  LACTIC ACID, PLASMA - Abnormal; Notable for the following:    Lactic Acid, Venous 4.9 (*)    All other components within normal limits  TROPONIN I - Abnormal; Notable for the following:    Troponin I 13.02 (*)    All other components within normal limits  APTT - Abnormal; Notable for the following:    aPTT 49 (*)    All other components within normal limits  CK - Abnormal; Notable for the following:    Total CK 645 (*)    All other components within normal limits  CARBOXYHEMOGLOBIN - Abnormal; Notable for the following:    Total hemoglobin 11.4 (*)    All other components within normal limits  TROPONIN I - Abnormal; Notable for the following:    Troponin I 6.43 (*)    All other components within normal limits   HEPARIN LEVEL (UNFRACTIONATED) - Abnormal; Notable for the following:    Heparin Unfractionated <0.10 (*)    All other components within normal limits  CK - Abnormal; Notable for the following:    Total CK 540 (*)    All other components within normal limits  GLUCOSE, CAPILLARY - Abnormal; Notable for the following:    Glucose-Capillary 304 (*)    All other components within normal limits  I-STAT CG4 LACTIC ACID, ED - Abnormal; Notable for the following:    Lactic Acid, Venous 3.45 (*)    All other components within normal limits  CBG MONITORING, ED - Abnormal; Notable for the following:    Glucose-Capillary 134 (*)    All other components within normal limits  I-STAT CG4 LACTIC ACID, ED - Abnormal; Notable for the following:    Lactic Acid, Venous 2.11 (*)    All other components within normal limits  I-STAT ARTERIAL BLOOD GAS, ED - Abnormal; Notable for the following:    pH, Arterial 7.341 (*)    pCO2 arterial 30.9 (*)    pO2, Arterial 146.0 (*)    Bicarbonate 16.2 (*)    Acid-base deficit 7.0 (*)    All other components within normal limits  POCT I-STAT 3, ART BLOOD GAS (G3+) - Abnormal; Notable for the following:    pH, Arterial 7.265 (*)    pCO2 arterial 30.7 (*)    Bicarbonate 13.8 (*)    Acid-base deficit 12.0 (*)    All other components  within normal limits  POCT I-STAT 3, ART BLOOD GAS (G3+) - Abnormal; Notable for the following:    pH, Arterial 7.327 (*)    pCO2 arterial 22.9 (*)    pO2, Arterial 47.0 (*)    Bicarbonate 11.9 (*)    Acid-base deficit 12.0 (*)    All other components within normal limits  POCT I-STAT 3, ART BLOOD GAS (G3+) - Abnormal; Notable for the following:    pH, Arterial 7.272 (*)    pCO2 arterial 26.4 (*)    pO2, Arterial 48.0 (*)    Bicarbonate 12.2 (*)    Acid-base deficit 13.0 (*)    All other components within normal limits  CULTURE, BLOOD (ROUTINE X 2)  CULTURE, BLOOD (ROUTINE X 2)  URINE CULTURE  CSF CULTURE  MRSA PCR SCREENING   INFLUENZA VIRUS AG, A+B (DFA)  CULTURE, RESPIRATORY (NON-EXPECTORATED)  CULTURE, BLOOD (ROUTINE X 2)  CULTURE, BLOOD (ROUTINE X 2)  ETHANOL  HIV ANTIBODY (ROUTINE TESTING)  TRIGLYCERIDES  MAGNESIUM  PHOSPHORUS  LACTIC ACID, PLASMA  MAGNESIUM  PHOSPHORUS  CORTISOL  PROTIME-INR  PROCALCITONIN  RPR  URINALYSIS, ROUTINE W REFLEX MICROSCOPIC (NOT AT [facility])  HEPATITIS C ANTIBODY  BLOOD GAS, ARTERIAL  BASIC METABOLIC PANEL  MAGNESIUM  PHOSPHORUS  CARBOXYHEMOGLOBIN  TYPE AND SCREEN  ABO/RH    Imaging Review Ct Head Wo Contrast  02/16/2016  CLINICAL DATA:  Fever and altered mental status; tremors EXAM: CT HEAD WITHOUT CONTRAST TECHNIQUE: Contiguous axial images were obtained from the base of the skull through the vertex without intravenous contrast. COMPARISON:  June 01, 2009 FINDINGS: The ventricles are mildly enlarged in a generalized manner and appear larger compared to the prior study. The sulci appear unremarkable. There is no intracranial mass, hemorrhage, extra-axial fluid collection, or midline shift. No focal gray-white compartment lesions are identified. No evidence of acute infarct. Bony calvarium appears intact. The mastoid air cells are clear. There is opacification in the left maxillary antrum as well as in multiple ethmoid air cells bilaterally. There is mild mucosal thickening in the left sphenoid sinus region. No intraorbital lesions are apparent. IMPRESSION: Ventricles mildly enlarged with sulci appearing normal. This appearance potentially could represent a degree of normal pressure hydrocephalus. This finding also could be secondary to prior or recent meningitis meningitis. No intracranial mass, hemorrhage, or extra-axial fluid collection. No acute infarct evident. Multifocal sinusitis. Given these current findings, brain MRI pre and post-contrast may be helpful to further assess, in particular to further evaluate the meningeal regions. Electronically Signed   By: Bretta Bang III M.D.   On: 02/16/2016 14:04   Dg Chest Port 1 View  02/17/2016  CLINICAL DATA:  Hypoxia EXAM: PORTABLE CHEST 1 VIEW COMPARISON:  February 16, 2016 FINDINGS: Endotracheal tube tip is 3.5 cm above the carina. Nasogastric tube tip and side port are in the stomach. No pneumothorax evident. There is interstitial edema throughout the lungs bilaterally, perhaps slightly increased from 1 day prior. No airspace consolidation. Heart is upper normal in size with pulmonary vascularity within normal limits. No adenopathy. IMPRESSION: Tube positions as described without pneumothorax. Slight increase in interstitial edema. No airspace consolidation. No change in cardiac silhouette. Electronically Signed   By: Bretta Bang III M.D.   On: 02/17/2016 07:31   Dg Chest Portable 1 View  02/16/2016  CLINICAL DATA:  Endotracheal tube placement EXAM: PORTABLE CHEST 1 VIEW COMPARISON:  Chest x-ray from earlier same day. FINDINGS: Endotracheal tube well positioned with tip approximately 2 cm above  the carina. Enteric tube passes below the diaphragm. Heart size is normal. There is central pulmonary vascular congestion and mild interstitial edema. Suspect small left pleural effusion. No pneumothorax. Osseous structures about the chest are unremarkable. IMPRESSION: 1. Endotracheal tube well positioned with tip approximately 2 cm above the carina. 2. Central pulmonary vascular congestion and mild bilateral interstitial edema suggesting mild volume overload/resuscitation efforts. No frank alveolar pulmonary edema. Electronically Signed   By: Bary Richard M.D.   On: 02/16/2016 19:38   Dg Chest Port 1 View  02/16/2016  CLINICAL DATA:  Altered mental status.  Depression. EXAM: PORTABLE CHEST 1 VIEW COMPARISON:  02/13/2016. FINDINGS: Midline trachea. Normal heart size and mediastinal contours. Left costophrenic angle exclusion. No pleural effusion or pneumothorax. Clear lungs. IMPRESSION: No active disease. Electronically  Signed   By: Jeronimo Greaves M.D.   On: 02/16/2016 13:48   Ct Portable Head W/o Cm  02/17/2016  CLINICAL DATA:  49 year old male with a history of hydrocephalus. Acute meningitis. EXAM: CT HEAD WITHOUT CONTRAST TECHNIQUE: Contiguous axial images were obtained from the base of the skull through the vertex without intravenous contrast. COMPARISON:  02/16/2016 FINDINGS: Unremarkable appearance of the calvarium without acute fracture or aggressive lesion. Unremarkable appearance of the scalp soft tissues. Unremarkable appearance of the bilateral orbits. Partially visualized endotracheal tube.  Oral enteric tube. Near complete opacification of the left maxillary sinus with frothy secretions. Trace secretions of the right maxillary sinus. Mucoperiosteal thickening of the ethmoid air cells and the sphenoid sinuses. Trace disease of the frontal sinuses. Mastoid air cells are clear. Layered fluid within the nasopharynx. Configuration the ventricles is similar to comparison with bifrontal diameter measuring 40 mm, unchanged from prior. Unchanged appearance of the hemispheric sulci. No acute intracranial hemorrhage.  No midline shift. Gray-white differentiation relatively maintained. IMPRESSION: Unchanged ventricular dilation and appearance of the cerebral sulci, with no evidence of acute hemorrhage or infarction. Signed, Yvone Neu. Loreta Ave, DO [facility] Electronically Signed   By: Gilmer Mor D.O.   On: 02/17/2016 07:23   I have personally reviewed and evaluated these images and lab results as part of my medical decision-making.   EKG Interpretation   Date/Time:  Tuesday February 16 2016 11:21:53 EDT Ventricular Rate:  119 PR Interval:  144 QRS Duration: 99 QT Interval:  285 QTC Calculation: 401 R Axis:   77 Text Interpretation:  Sinus tachycardia Biatrial enlargement Left  ventricular hypertrophy ST elev, probable normal early repol pattern ED   PHYSICIAN INTERPRETATION AVAILABLE IN [facility] Confirmed by TEST,  Record ([id number]) on 02/17/2016 7:18:30 AM      MDM   Final diagnoses:  Altered mental status, unspecified altered mental status type   Please see Chase Picket Ward PA-C note for MDM. This is procedure note.     Alvira Monday, MD 02/17/16 1201

## 2016-02-17 NOTE — Procedures (Signed)
Pulmonary Artery Catheter Insertion Procedure Note Dale [name] [id number] 08/17/1966  Procedure: Insertion of Pulmonary Artery Catheter  Indications: Assessment of intravascular volume  Procedure Details Consent: Unable to obtain consent because of emergent medical necessity. Time Out: Verified patient identification, verified procedure, site/side was marked, verified correct patient position, special equipment/implants available, medications/allergies/relevent history reviewed, required imaging and test results available.  Performed  Description of Procedure Maximum sterile technique was used including antiseptics, cap, gloves, gown, hand hygiene, mask and sheet. Skin prep: Chlorhexidine; local anesthetic administered Pulmonary Artery Catheter was placed in the right internal jugular vein; introducer inserted over guidewire, initial position assessed by monitoring pressure waveform and catheter advanced after balloon inflation.  Evaluation Pressure waveform tracings: good, Pulmonary capillary wedge tracing: show considerable artifact, wedge tracing obtained after inflation of balloon with 1.25 - 1.5 cc. Complications: No apparent complications Patient did tolerate procedure well. Chest X-ray ordered to verify placement.  CXR: pending.   Dale [name],Dale Fei J. 02/17/2016   US \\Dale [name]  Dale DanJ. Dale Ziemba, MD, FACP Pgr: [phone] [facility]

## 2016-02-17 NOTE — Interval H&P Note (Signed)
History and Physical Interval Note:  02/17/2016 4:19 PM  Dale [name]  has presented today for surgery, with the diagnosis of cryptogenic stroke  The various methods of treatment have been discussed with the patient and family. After consideration of risks, benefits and other options for treatment, the patient has consented to  Procedure(s): IABP Insertion (N/A) as a surgical intervention .  The patient's history has been reviewed, patient examined, no change in status, stable for surgery.  I have reviewed the patient's chart and labs.  Questions were answered to the patient's satisfaction.     Lorine BearsMuhammad Benigno Check

## 2016-02-17 NOTE — Progress Notes (Signed)
ANTICOAGULATION CONSULT NOTE - Initial Consult  Pharmacy Consult for heparin Indication: IABP  No Known Allergies  Patient Measurements: Height: 5' 8.9" (175 cm) Weight: 147 lb 7.8 oz (66.9 kg) IBW/kg (Calculated) : 70.47 Heparin Dosing Weight: 66.9 kg  Vital Signs: Temp: 98.2 F (36.8 C) (03/15 1530) Temp Source: Rectal (03/15 0839) BP: 101/66 mmHg (03/15 1732) Pulse Rate: 123 (03/15 1732)  Labs:  Recent Labs  02/16/16 2016 02/17/16 0033 02/17/16 0035 02/17/16 0053 02/17/16 0500 02/17/16 0845  HGB 12.8*  --  11.0*  --  12.0*  --   HCT 36.7*  --  32.6*  --  33.6*  --   PLT 137*  --  120*  --  136*  --   APTT  --   --   --  49*  --   --   LABPROT  --   --   --  14.8  --   --   INR  --   --   --  1.14  --   --   HEPARINUNFRC  --   --   --   --   --  <0.10*  CREATININE 1.28* 1.86*  --   --  1.65*  --   CKTOTAL  --  645*  --   --   --  540*  TROPONINI 7.59* 13.02*  --   --  6.43* 4.74*    Estimated Creatinine Clearance: 50.1 mL/min (by C-G formula based on Cr of 1.65).   Medical History: Past Medical History  Diagnosis Date  . Depression     Medications:  Scheduled:  . antiseptic oral rinse  7 mL Mouth Rinse QID  . artificial tears  1 application Both Eyes 3 times per day  . aspirin  325 mg Oral Daily  . cefTRIAXone (ROCEPHIN)  IV  2 g Intravenous Q12H  . chlorhexidine gluconate  15 mL Mouth Rinse BID  . dexamethasone  10 mg Intravenous Q6H  . fentaNYL (SUBLIMAZE) injection  100 mcg Intravenous Once  . folic acid  1 mg Intravenous Daily  . insulin aspart  0-15 Units Subcutaneous 6 times per day  . metoprolol  2.5 mg Intravenous 4 times per day  . midazolam  2 mg Intravenous Once  . pantoprazole (PROTONIX) IV  40 mg Intravenous Daily  . sodium bicarbonate      . sodium bicarbonate  100 mEq Intravenous Once  . thiamine  100 mg Intravenous Daily  . vancomycin  500 mg Intravenous Q12H   Infusions:  . sodium chloride 75 mL/hr (02/17/16 0159)  .  amiodarone 30 mg/hr (02/17/16 1825)  . cisatracurium (NIMBEX) infusion 3 mcg/kg/min (02/17/16 1540)  . DOBUTamine 5 mcg/kg/min (02/17/16 1515)  . fentaNYL infusion INTRAVENOUS 150 mcg/hr (02/17/16 1215)  . furosemide (LASIX) infusion 5 mg/hr (02/17/16 1130)  . midazolam (VERSED) infusion 2 mg/hr (02/17/16 1347)  . norepinephrine (LEVOPHED) Adult infusion 60 mcg/min (02/17/16 1823)  .  sodium bicarbonate  infusion 1000 mL 40 mL/hr at 02/17/16 1110  . vasopressin (PITRESSIN) infusion - *FOR SHOCK* 0.03 Units/min (02/17/16 [id number])    Assessment: 50 yo male with IABP will be started on heparin.  Hgb 12 and Plt 136 K  Goal of Therapy:  Heparin level 0.2-0.5 units/ml Monitor platelets by anticoagulation protocol: Yes   Plan:  - Start heparin drip at 700 units/hr. No bolus. - 6hr heparin level - daily heparin level and CBC  Dale [name], Tsz-Yin 02/17/2016,6:25 PM

## 2016-02-17 NOTE — H&P (View-Only) (Signed)
Patient ID: Theodore DemarkGraig D Nakamura, male   DOB: 12/11/1965, 50 y.o.   MRN: [id number]  Patient seen this morning by cardiology consult.  Asked by Dr Tyson [name] to see patient due to clinical decline.  Has mixed cardiogenic/septic shock in setting of streptococcal bacteremia and documented meningitis.  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  Procalcitonin 73, creatinine 1.6, troponin 4.74.    He had cardiac arrest in ER => initially PEA then vfib with defibrillation.  He is on amiodarone gtt.   Swan placed:  CVP 11, PCWP 25, CI 1.35.    Patient is currently on norepinephrine 50, vasopressin 0.03, dobutamine 2.5.  - Increase dobutamine to 5.  - He will need mechanical support, will arrange IABP placement in cath lab asap.  Will need to use left groin as art line in right.   Discussed with Dr Tyson [name] and family.   35 minutes critical care time.   Marca AnconaDalton Laneka Mcgrory 02/17/2016

## 2016-02-17 NOTE — Significant Event (Signed)
PATIENT NAME: Dale [name] MEDICAL RECORD NUMBER: [id number] Birthday: 11/28/1966  Age: 50 y.o. Admit Date: 02/16/2016  Provider: Joneen RoachPaul Aneta Hendershott AGACNP-BC  Indication: Cardiac arrest  Technical Description:   CPR performance duration: 5 mins  Was defibrillation or cardioversion used ? Yes x 1 for VF. 150J  Was external pacer placed ? No  Was patient intubated pre/post CPR ? pre  Was transvenous pacer placed ? no  Medications Administered Include      Yes/no Amiodarone   Atropin   Calcium   Epinephrine x2  Lidocaine   Magnesium   Norepinephrine   Phenylephrine   Sodium bicarbonate x2  Vasopression    Evaluation  Final Status - Was patient successfully resuscitated ? Yes  If successfully resuscitated - what is current rhythm ? ST If successfully resuscitated - what is current hemodynamic status ? Stable on high dose pressors  Joneen RoachPaul Takuya Lariccia, AGACNP-BC [facility] Pager [phone] or [phone]  02/17/2016 12:52 AM

## 2016-02-17 NOTE — Progress Notes (Addendum)
eLink Physician-Brief Progress Note Patient Name: Dale [name] DOB: 05/09/1966 MRN: [id number]   Date of Service  02/17/2016  HPI/Events of Note  Pt with CODE BLUE - loss of pulse, asystole, ACLS performed, got 1 of epi, then noted to be in vfib, shock 150J, sinus tach, ROSC - total code time approximately 6 mins  eICU Interventions  Recheck labs, trend CE, BMP, CBC EKG CXR Cards consult Levophed CVL, Aline Stop propofol Fentanyl/versed     Intervention Category Major Interventions: Code management / supervision  Bentleigh Stankus 02/17/2016, 12:17 AM

## 2016-02-17 NOTE — Progress Notes (Signed)
  Amiodarone Drug - Drug Interaction Consult Note  Recommendations: NO SPECIFIC RECOMMENDATIONS W/ CURRENT MEDS.  Amiodarone is metabolized by the cytochrome P450 system and therefore has the potential to cause many drug interactions. Amiodarone has an average plasma half-life of 50 days (range 20 to 100 days).   There is potential for drug interactions to occur several weeks or months after stopping treatment and the onset of drug interactions may be slow after initiating amiodarone.   []  Statins: Increased risk of myopathy. Simvastatin- restrict dose to 20mg  daily. Other statins: counsel patients to report any muscle pain or weakness immediately.  []  Anticoagulants: Amiodarone can increase anticoagulant effect. Consider warfarin dose reduction. Patients should be monitored closely and the dose of anticoagulant altered accordingly, remembering that amiodarone levels take several weeks to stabilize.  []  Antiepileptics: Amiodarone can increase plasma concentration of phenytoin, the dose should be reduced. Note that small changes in phenytoin dose can result in large changes in levels. Monitor patient and counsel on signs of toxicity.  []  Beta blockers: increased risk of bradycardia, AV block and myocardial depression. Sotalol - avoid concomitant use.  []   Calcium channel blockers (diltiazem and verapamil): increased risk of bradycardia, AV block and myocardial depression.  []   Cyclosporine: Amiodarone increases levels of cyclosporine. Reduced dose of cyclosporine is recommended.  []  Digoxin dose should be halved when amiodarone is started.  []  Diuretics: increased risk of cardiotoxicity if hypokalemia occurs.  []  Oral hypoglycemic agents (glyburide, glipizide, glimepiride): increased risk of hypoglycemia. Patient's glucose levels should be monitored closely when initiating amiodarone therapy.   []  Drugs that prolong the QT interval:  Torsades de pointes risk may be increased with  concurrent use - avoid if possible.  Monitor QTc, also keep magnesium/potassium WNL if concurrent therapy can't be avoided. Marland Kitchen. Antibiotics: e.g. fluoroquinolones, erythromycin. . Antiarrhythmics: e.g. quinidine, procainamide, disopyramide, sotalol. . Antipsychotics: e.g. phenothiazines, haloperidol.  . Lithium, tricyclic antidepressants, and methadone.  Thank You,  Vernard GamblesVeronda Keshan Reha, PharmD, BCPS  02/17/2016 7:17 AM

## 2016-02-17 NOTE — Progress Notes (Signed)
EEG stopped for nurse; pt taken to Cath Lab; Dr Lavon PaganiniNandigam stated can discontinue EEG LTM.

## 2016-02-17 NOTE — Progress Notes (Signed)
eLink Physician-Brief Progress Note Patient Name: Dale DemarkGraig D Rasco DOB: 08/07/1966 MRN: [id number]   Date of Service  02/17/2016  HPI/Events of Note  K=3.0 Ca=6.6  eICU Interventions  K+ replaced IV and per tube Ca replaced IV     Intervention Category Major Interventions: Electrolyte abnormality - evaluation and management  Jahni Paul 02/17/2016, 1:32 AM

## 2016-02-17 NOTE — Progress Notes (Signed)
CRITICAL VALUE ALERT  Critical value received:  LA 4.9  Date of notification:  02/17/16  Time of notification:  0150  Critical value read back:Yes.    Nurse who received alert:  Vilinda BlanksBrandy Dwanda Tufano, RN  MD notified (1st page):  Joneen RoachPaul Hoffman, DNP  Time of first page:    MD notified (2nd page):  Time of second page:  Responding MD:  Joneen RoachPaul Hoffman, DNP  Time MD responded:  0200

## 2016-02-17 NOTE — Progress Notes (Addendum)
Patient ID: Dale [name], male   DOB: 06/26/1966, 49 y.o.   MRN: [id number]  Patient seen this morning by cardiology consult.  Asked by Dr Feinstein to see patient due to clinical decline.  Has mixed cardiogenic/septic shock in setting of streptococcal bacteremia and documented meningitis.  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  Procalcitonin 73, creatinine 1.6, troponin 4.74.    He had cardiac arrest in ER => initially PEA then vfib with defibrillation.  He is on amiodarone gtt.   Swan placed:  CVP 11, PCWP 25, CI 1.35.    Patient is currently on norepinephrine 50, vasopressin 0.03, dobutamine 2.5.  - Increase dobutamine to 5.  - He will need mechanical support, will arrange IABP placement in cath lab asap.  Will need to use left groin as art line in right.   Discussed with Dr Feinstein and family.   35 minutes critical care time.   Dale [name] 02/17/2016  

## 2016-02-17 NOTE — Progress Notes (Addendum)
PULMONARY / CRITICAL CARE MEDICINE   Name: Dale [name] MRN: [id number] DOB: 09/27/1966    ADMISSION DATE:  02/16/2016 CONSULTATION DATE:  02/16/16  REFERRING MD:  Rito Ehrlich  CHIEF COMPLAINT:  AMS  Dale [name] is a 50 y.o. male with PMH as outlined below. He initially presented to [facility] ED 03/11 with URI symptoms. Benig managed for meningitis, SHOCK, sepsis, cardiomyopathy, coded.  SUBJECTIVE:  Opens eyes to pain  VITAL SIGNS: BP 108/72 mmHg  Pulse 136  Temp(Src) 100.1 F (37.8 C) (Rectal)  Resp 20  Ht 5' 8.9" (1.75 m)  Wt 66.9 kg (147 lb 7.8 oz)  BMI 21.84 kg/m2  SpO2 96%  HEMODYNAMICS: CVP:  [1 mmHg] 1 mmHg  VENTILATOR SETTINGS: Vent Mode:  [-] PRVC FiO2 (%):  [80 %-100 %] 100 % Set Rate:  [16 bmp] 16 bmp Vt Set:  [600 mL] 600 mL PEEP:  [5 cmH20-16 cmH20] 16 cmH20 Plateau Pressure:  [18 cmH20-25 cmH20] 19 cmH20  INTAKE / OUTPUT: I/O last 3 completed shifts: In: 4458.6 [I.V.:3998.6; IV Piggyback:460] Out: 1400 [Urine:950; Emesis/NG output:450]  PHYSICAL EXAMINATION: General: Adult male, thin, unresponsive Neuro: per pinpoint, gag present, no movement arms legs HEENT: [place]/AT. PERRL Cardiovascular: s1 s2 Tachy, regular Lungs: Ronchi Abdomen: BS x 4, soft, NT/ND.  Musculoskeletal: No gross deformities, no edema.  Skin: Intact, dry, warm, no rashes.  LABS:  BMET  Recent Labs Lab 02/16/16 1200 02/16/16 2016 02/17/16 0033 02/17/16 0500  NA 131*  --  141 134*  K 4.1  --  3.0* 4.4  CL 93*  --  106 106  CO2 19*  --  17* 14*  BUN 17  --  16 22*  CREATININE 1.66* 1.28* 1.86* 1.65*  GLUCOSE 132*  --  145* 309*    Electrolytes  Recent Labs Lab 02/16/16 1200 02/17/16 0033 02/17/16 0500  CALCIUM 9.0 6.5* 6.5*  MG  --  2.2 2.1  PHOS  --  4.6 3.4    CBC  Recent Labs Lab 02/16/16 2016 02/17/16 0035 02/17/16 0500  WBC 16.9* 15.4* 19.7*  HGB 12.8* 11.0* 12.0*  HCT 36.7* 32.6* 33.6*  PLT 137* 120* 136*    Coag's  Recent Labs Lab  02/17/16 0053  APTT 49*  INR 1.14    Sepsis Markers  Recent Labs Lab 02/16/16 1717 02/16/16 2006 02/17/16 0030 02/17/16 0053  LATICACIDVEN 2.11* 1.5 4.9*  --   PROCALCITON  --   --   --  73.53    ABG  Recent Labs Lab 02/17/16 0012 02/17/16 0510 02/17/16 0928  PHART 7.265* 7.383 7.327*  PCO2ART 30.7* 22.2* 22.9*  PO2ART 91.0 98.4 47.0*    Liver Enzymes  Recent Labs Lab 02/16/16 1200 02/17/16 0033  AST 117* 174*  ALT 42 45  ALKPHOS 56 39  BILITOT 1.1 1.7*  ALBUMIN 2.9* 1.7*    Cardiac Enzymes  Recent Labs Lab 02/17/16 0033 02/17/16 0500 02/17/16 0845  TROPONINI 13.02* 6.43* 4.74*    Glucose  Recent Labs Lab 02/16/16 1126 02/17/16 0837  GLUCAP 134* 304*    Imaging Ct Head Wo Contrast  02/16/2016  CLINICAL DATA:  Fever and altered mental status; tremors EXAM: CT HEAD WITHOUT CONTRAST TECHNIQUE: Contiguous axial images were obtained from the base of the skull through the vertex without intravenous contrast. COMPARISON:  June 01, 2009 FINDINGS: The ventricles are mildly enlarged in a generalized manner and appear larger compared to the prior study. The sulci appear unremarkable. There is no intracranial mass, hemorrhage, extra-axial  fluid collection, or midline shift. No focal gray-white compartment lesions are identified. No evidence of acute infarct. Bony calvarium appears intact. The mastoid air cells are clear. There is opacification in the left maxillary antrum as well as in multiple ethmoid air cells bilaterally. There is mild mucosal thickening in the left sphenoid sinus region. No intraorbital lesions are apparent. IMPRESSION: Ventricles mildly enlarged with sulci appearing normal. This appearance potentially could represent a degree of normal pressure hydrocephalus. This finding also could be secondary to prior or recent meningitis meningitis. No intracranial mass, hemorrhage, or extra-axial fluid collection. No acute infarct evident. Multifocal  sinusitis. Given these current findings, brain MRI pre and post-contrast may be helpful to further assess, in particular to further evaluate the meningeal regions. Electronically Signed   By: Bretta Bang III M.D.   On: 02/16/2016 14:04   Dg Chest Port 1 View  02/17/2016  CLINICAL DATA:  Hypoxia EXAM: PORTABLE CHEST 1 VIEW COMPARISON:  February 16, 2016 FINDINGS: Endotracheal tube tip is 3.5 cm above the carina. Nasogastric tube tip and side port are in the stomach. No pneumothorax evident. There is interstitial edema throughout the lungs bilaterally, perhaps slightly increased from 1 day prior. No airspace consolidation. Heart is upper normal in size with pulmonary vascularity within normal limits. No adenopathy. IMPRESSION: Tube positions as described without pneumothorax. Slight increase in interstitial edema. No airspace consolidation. No change in cardiac silhouette. Electronically Signed   By: Bretta Bang III M.D.   On: 02/17/2016 07:31   Dg Chest Portable 1 View  02/16/2016  CLINICAL DATA:  Endotracheal tube placement EXAM: PORTABLE CHEST 1 VIEW COMPARISON:  Chest x-ray from earlier same day. FINDINGS: Endotracheal tube well positioned with tip approximately 2 cm above the carina. Enteric tube passes below the diaphragm. Heart size is normal. There is central pulmonary vascular congestion and mild interstitial edema. Suspect small left pleural effusion. No pneumothorax. Osseous structures about the chest are unremarkable. IMPRESSION: 1. Endotracheal tube well positioned with tip approximately 2 cm above the carina. 2. Central pulmonary vascular congestion and mild bilateral interstitial edema suggesting mild volume overload/resuscitation efforts. No frank alveolar pulmonary edema. Electronically Signed   By: Bary Richard M.D.   On: 02/16/2016 19:38   Dg Chest Port 1 View  02/16/2016  CLINICAL DATA:  Altered mental status.  Depression. EXAM: PORTABLE CHEST 1 VIEW COMPARISON:  02/13/2016.  FINDINGS: Midline trachea. Normal heart size and mediastinal contours. Left costophrenic angle exclusion. No pleural effusion or pneumothorax. Clear lungs. IMPRESSION: No active disease. Electronically Signed   By: Jeronimo Greaves M.D.   On: 02/16/2016 13:48   Ct Portable Head W/o Cm  02/17/2016  CLINICAL DATA:  50 year old male with a history of hydrocephalus. Acute meningitis. EXAM: CT HEAD WITHOUT CONTRAST TECHNIQUE: Contiguous axial images were obtained from the base of the skull through the vertex without intravenous contrast. COMPARISON:  02/16/2016 FINDINGS: Unremarkable appearance of the calvarium without acute fracture or aggressive lesion. Unremarkable appearance of the scalp soft tissues. Unremarkable appearance of the bilateral orbits. Partially visualized endotracheal tube.  Oral enteric tube. Near complete opacification of the left maxillary sinus with frothy secretions. Trace secretions of the right maxillary sinus. Mucoperiosteal thickening of the ethmoid air cells and the sphenoid sinuses. Trace disease of the frontal sinuses. Mastoid air cells are clear. Layered fluid within the nasopharynx. Configuration the ventricles is similar to comparison with bifrontal diameter measuring 40 mm, unchanged from prior. Unchanged appearance of the hemispheric sulci. No acute intracranial hemorrhage.  No midline shift. Gray-white differentiation relatively maintained. IMPRESSION: Unchanged ventricular dilation and appearance of the cerebral sulci, with no evidence of acute hemorrhage or infarction. Signed, Yvone NeuJaime S. Loreta AveWagner, DO [facility] Electronically Signed   By: Gilmer MorJaime  Wagner D.O.   On: 02/17/2016 07:23     STUDIES:  CXR 03/14 > central vascular congestion. CT head 03/14 > mildly enlarged ventricles with normal sulci.  Could potentially represent NPH or meningitis.  Multifocal sinusitis. 3/15 early AM > Bedside echo by cardiology fellow noted  very low LVEF likely 10%. RV down as well, but now quite as bad.   CULTURES: Blood 03/14 > Urine 03/14 > CSF 03/14 > 340 WBC's (88 neut), < 20 glucose, GPC's in pairs >   ANTIBIOTICS: Vanc 03/14 >>> Ceftriaxone 03/14 >>> Ampicillin 03/14 >>>3/15  SIGNIFICANT EVENTS: 03/14 > admitted with meningitis.  Required intubation for airway protection due to decline in mental status. 3/15 early AM > Cardiac arrest, 5 mins, Asystole > VF > ST  LINES/TUBES: ETT 03/14 > CVL R fem 3/15 (placed emergently, but under full sterile precautions)>>> Art line R fem 3/15 (placed emergently, but under full sterile precautions)>>>  DISCUSSION: 50 y.o. M admitted 03/14 for AMS secondary to meningitis.  He required intubation in ED for decline in mental status. Suffered cardiac arrest 3/15 early AM, ROSC after 5 mins. Requiring high dose levophed.  ASSESSMENT / PLAN:  NEUROLOGIC A:   Acute metabolic encephalopathy -meningitis.  S/p LP in ED 03/14. ETOH use disorder. Polysubstance abuse - UDS positive for THC. Anoxia post arrest R/o additional complicating feature to meningitis (abscess, cva) P:   Sedation:  Versed / Fentanyl PRN - off RASS goal: 0  Daily WUA Thiamine / Folate. Neurology following MRi if able, unable Consider eeg Decadron ensure proper dosing  PULMONARY A: Acute resp failure CHF, pulm edema worsening R/o ali P:   ABG reviewed, peep to 16  In spite of pressors need neg balance as unable to oxygenate a nd clear edema on pcxr Place cvp Rate to 20  abg in 30 min   CARDIOVASCULAR A:  Shock - septic + cardiogenic  Sinus tachycardia - likely combination of sepsis + agitation post intubation. Acute systolic heart failure - Takatsubo cardiomyopathy appearance on bedside echo with apical hypo/akinesis. LVEF estimated 5-10%. Consider stunned myocardium immediately post-arrest vs viral myocarditis Cardiac arrest 3/15 5 mins, asystole > VF, shock > ST NSTEMI -elevated trop in  ED P:  Tele Maintaining on levo , map goal 60 KVO IVF Monitor CVP, i will place line neck, consider swan Check co-ox again now, may need inotrope support, repeat Lasix drip required Keep k greater 4, mag greater 2 Maintain vaso amio  RENAL A:   Hyponatremia - likely due to ETOH. AKI. AGMA - lactate cleared NONAG noted P:   NS kvo Lasix drip, unable to oxygenate bmet q12h Start low rate bicarb for NONAG  GASTROINTESTINAL A:   GI prophylaxis. Nutrition. Shock liver likely, etoh P:   SUP: Pantoprazole. Start feeds Repeat lft in am   HEMATOLOGIC A:   VTE Prophylaxis. P:  SCD's / Heparin. CBC now and in AM  INFECTIOUS A:   Meningitis Sinusitis - as seen on CT head. Trichomonas - s/p treatment with flagyl on prior ED visit 03/11. P:   Abx as above (vanc / ampicillin dc  / ceftriaxone) Narrow off vance if when sens strep noted Assess RPR- p HIV neg ID following decadron  ENDOCRINE  A:   hyperglcyemia P:   Monitor glucose on BMP. Add ssi Steroids needed  Family updated: Family update at bedside. Wife (separated 21 years but still married). Made aware prognosis guarded, may not survive. Continue full code  Interdisciplinary Family Meeting v Palliative Care Meeting:  Due by: 03/20.  CC time not including procedures: 45 minutes.  Mcarthur Rossetti. Tyson Alias, MD, FACP Pgr: [phone] [facility]     Additional management Worsening status Appearing as cardiogenic shock Not responding to peep = cardiac likely Updated wife, Dale [name] to survive Consider dobut as svo2 repeat 20% Stat swan, emegent to assess CI. Noninvasive CI was 2.5 ish  Accurate? Called chf service, consider impella Get swan Ci first then re assess ABg, noted increase MV  Ccm time now 2 hours  Mcarthur Rossetti. Tyson Alias, MD, FACP Pgr: [phone] [facility]   I have had extensive discussions with family wife, kids. We discussed patients current  circumstances and organ failures. We also discussed patient's prior wishes under circumstances such as this. Family has decided to NOT perform resuscitation if arrest but to continue current medical support for now.   Mcarthur Rossetti. Tyson Alias, MD, FACP Pgr: [phone] [facility]

## 2016-02-17 NOTE — Progress Notes (Signed)
LTM EEG started 

## 2016-02-18 ENCOUNTER — Inpatient Hospital Stay (HOSPITAL_COMMUNITY): Payer: Medicaid Other

## 2016-02-18 ENCOUNTER — Ambulatory Visit (HOSPITAL_COMMUNITY): Payer: Medicaid Other

## 2016-02-18 ENCOUNTER — Encounter (HOSPITAL_COMMUNITY): Payer: Self-pay | Admitting: Cardiovascular Disease

## 2016-02-18 DIAGNOSIS — I509 Heart failure, unspecified: Secondary | ICD-10-CM

## 2016-02-18 DIAGNOSIS — I469 Cardiac arrest, cause unspecified: Secondary | ICD-10-CM

## 2016-02-18 DIAGNOSIS — G001 Pneumococcal meningitis: Secondary | ICD-10-CM

## 2016-02-18 DIAGNOSIS — J80 Acute respiratory distress syndrome: Secondary | ICD-10-CM

## 2016-02-18 DIAGNOSIS — A530 Latent syphilis, unspecified as early or late: Secondary | ICD-10-CM

## 2016-02-18 DIAGNOSIS — Z9911 Dependence on respirator [ventilator] status: Secondary | ICD-10-CM

## 2016-02-18 LAB — CBC WITH DIFFERENTIAL/PLATELET
BASOS ABS: 0 10*3/uL (ref 0.0–0.1)
BASOS PCT: 0 %
Eosinophils Absolute: 0 10*3/uL (ref 0.0–0.7)
Eosinophils Relative: 0 %
HEMATOCRIT: 30.4 % — AB (ref 39.0–52.0)
HEMOGLOBIN: 11 g/dL — AB (ref 13.0–17.0)
LYMPHS PCT: 7 %
Lymphs Abs: 1.5 10*3/uL (ref 0.7–4.0)
MCH: 31 pg (ref 26.0–34.0)
MCHC: 36.2 g/dL — ABNORMAL HIGH (ref 30.0–36.0)
MCV: 85.6 fL (ref 78.0–100.0)
MONO ABS: 0.6 10*3/uL (ref 0.1–1.0)
Monocytes Relative: 3 %
NEUTROS ABS: 20 10*3/uL — AB (ref 1.7–7.7)
NEUTROS PCT: 90 %
Platelets: 144 10*3/uL — ABNORMAL LOW (ref 150–400)
RBC: 3.55 MIL/uL — ABNORMAL LOW (ref 4.22–5.81)
RDW: 13.4 % (ref 11.5–15.5)
WBC: 22.1 10*3/uL — ABNORMAL HIGH (ref 4.0–10.5)

## 2016-02-18 LAB — POCT I-STAT 3, ART BLOOD GAS (G3+)
BICARBONATE: 23.2 meq/L (ref 20.0–24.0)
Bicarbonate: 23.9 mEq/L (ref 20.0–24.0)
O2 SAT: 94 %
O2 SAT: 95 %
PCO2 ART: 31.5 mmHg — AB (ref 35.0–45.0)
PO2 ART: 63 mmHg — AB (ref 80.0–100.0)
Patient temperature: 35.3
Patient temperature: 35.1
TCO2: 24 mmol/L (ref 0–100)
TCO2: 25 mmol/L (ref 0–100)
pCO2 arterial: 29.8 mmHg — ABNORMAL LOW (ref 35.0–45.0)
pH, Arterial: 7.493 — ABNORMAL HIGH (ref 7.350–7.450)
pH, Arterial: 7.481 — ABNORMAL HIGH (ref 7.350–7.450)
pO2, Arterial: 57 mmHg — ABNORMAL LOW (ref 80.0–100.0)

## 2016-02-18 LAB — GLUCOSE, CAPILLARY
GLUCOSE-CAPILLARY: 132 mg/dL — AB (ref 65–99)
GLUCOSE-CAPILLARY: 141 mg/dL — AB (ref 65–99)
GLUCOSE-CAPILLARY: 142 mg/dL — AB (ref 65–99)
GLUCOSE-CAPILLARY: 143 mg/dL — AB (ref 65–99)
GLUCOSE-CAPILLARY: 144 mg/dL — AB (ref 65–99)
GLUCOSE-CAPILLARY: 150 mg/dL — AB (ref 65–99)
GLUCOSE-CAPILLARY: 153 mg/dL — AB (ref 65–99)
GLUCOSE-CAPILLARY: 155 mg/dL — AB (ref 65–99)
GLUCOSE-CAPILLARY: 191 mg/dL — AB (ref 65–99)
GLUCOSE-CAPILLARY: 223 mg/dL — AB (ref 65–99)
GLUCOSE-CAPILLARY: 234 mg/dL — AB (ref 65–99)
Glucose-Capillary: 212 mg/dL — ABNORMAL HIGH (ref 65–99)
Glucose-Capillary: 236 mg/dL — ABNORMAL HIGH (ref 65–99)
Glucose-Capillary: 205 mg/dL — ABNORMAL HIGH (ref 65–99)
Glucose-Capillary: 164 mg/dL — ABNORMAL HIGH (ref 65–99)
Glucose-Capillary: 127 mg/dL — ABNORMAL HIGH (ref 65–99)
Glucose-Capillary: 147 mg/dL — ABNORMAL HIGH (ref 65–99)
Glucose-Capillary: 151 mg/dL — ABNORMAL HIGH (ref 65–99)
Glucose-Capillary: 137 mg/dL — ABNORMAL HIGH (ref 65–99)
Glucose-Capillary: 154 mg/dL — ABNORMAL HIGH (ref 65–99)

## 2016-02-18 LAB — COMPREHENSIVE METABOLIC PANEL
ALBUMIN: 1.5 g/dL — AB (ref 3.5–5.0)
ALT: 100 U/L — AB (ref 17–63)
AST: 515 U/L — AB (ref 15–41)
Alkaline Phosphatase: 42 U/L (ref 38–126)
Anion gap: 14 (ref 5–15)
BUN: 28 mg/dL — AB (ref 6–20)
CHLORIDE: 102 mmol/L (ref 101–111)
CO2: 18 mmol/L — AB (ref 22–32)
CREATININE: 1.83 mg/dL — AB (ref 0.61–1.24)
Calcium: 6.6 mg/dL — ABNORMAL LOW (ref 8.9–10.3)
GFR calc Af Amer: 48 mL/min — ABNORMAL LOW (ref >60)
GFR, EST NON AFRICAN AMERICAN: 41 mL/min — AB (ref >60)
GLUCOSE: 208 mg/dL — AB (ref 65–99)
Potassium: 3.1 mmol/L — ABNORMAL LOW (ref 3.5–5.1)
SODIUM: 134 mmol/L — AB (ref 135–145)
Total Bilirubin: 0.3 mg/dL (ref 0.3–1.2)
Total Protein: 4.9 g/dL — ABNORMAL LOW (ref 6.5–8.1)

## 2016-02-18 LAB — BASIC METABOLIC PANEL
Anion gap: 15 (ref 5–15)
BUN: 28 mg/dL — AB (ref 6–20)
CO2: 22 mmol/L (ref 22–32)
CREATININE: 1.79 mg/dL — AB (ref 0.61–1.24)
Calcium: 6.8 mg/dL — ABNORMAL LOW (ref 8.9–10.3)
Chloride: 101 mmol/L (ref 101–111)
GFR calc Af Amer: 49 mL/min — ABNORMAL LOW (ref >60)
GFR, EST NON AFRICAN AMERICAN: 42 mL/min — AB (ref >60)
GLUCOSE: 148 mg/dL — AB (ref 65–99)
POTASSIUM: 3.6 mmol/L (ref 3.5–5.1)
SODIUM: 138 mmol/L (ref 135–145)

## 2016-02-18 LAB — CARBOXYHEMOGLOBIN
CARBOXYHEMOGLOBIN: 0.7 % (ref 0.5–1.5)
CARBOXYHEMOGLOBIN: 0.4 % — AB (ref 0.5–1.5)
Carboxyhemoglobin: 0.8 % (ref 0.5–1.5)
METHEMOGLOBIN: 1 % (ref 0.0–1.5)
METHEMOGLOBIN: 1 % (ref 0.0–1.5)
METHEMOGLOBIN: 0.9 % (ref 0.0–1.5)
O2 SAT: 40.4 %
O2 SAT: 51.2 %
O2 SAT: 64.7 %
TOTAL HEMOGLOBIN: 10.8 g/dL — AB (ref 13.5–18.0)
TOTAL HEMOGLOBIN: 9.5 g/dL — AB (ref 13.5–18.0)
TOTAL HEMOGLOBIN: 16.2 g/dL (ref 13.5–18.0)

## 2016-02-18 LAB — ECHOCARDIOGRAM COMPLETE
Height: 68.898 in
Weight: 2451.52 oz

## 2016-02-18 LAB — HEPARIN LEVEL (UNFRACTIONATED)
HEPARIN UNFRACTIONATED: 0.15 [IU]/mL — AB (ref 0.30–0.70)
HEPARIN UNFRACTIONATED: 0.32 [IU]/mL (ref 0.30–0.70)
Heparin Unfractionated: 0.11 IU/mL — ABNORMAL LOW (ref 0.30–0.70)

## 2016-02-18 LAB — MAGNESIUM: Magnesium: 2 mg/dL (ref 1.7–2.4)

## 2016-02-18 LAB — PHOSPHORUS: PHOSPHORUS: 1.7 mg/dL — AB (ref 2.5–4.6)

## 2016-02-18 LAB — HEPATITIS C ANTIBODY: HCV AB: 0.1 {s_co_ratio} (ref 0.0–0.9)

## 2016-02-18 MED ORDER — INSULIN ASPART 100 UNIT/ML ~~LOC~~ SOLN
3.0000 [IU] | SUBCUTANEOUS | Status: DC
  Administered 2016-02-18 – 2016-02-27 (×38): 3 [IU] via SUBCUTANEOUS

## 2016-02-18 MED ORDER — ASPIRIN 325 MG PO TABS
325.0000 mg | ORAL_TABLET | Freq: Every day | ORAL | Status: DC
  Administered 2016-02-18 – 2016-02-19 (×2): 325 mg
  Filled 2016-02-18 (×2): qty 1

## 2016-02-18 MED ORDER — INSULIN REGULAR HUMAN 100 UNIT/ML IJ SOLN
INTRAMUSCULAR | Status: DC
  Administered 2016-02-18: 1.6 [IU]/h via INTRAVENOUS
  Filled 2016-02-18: qty 2.5

## 2016-02-18 MED ORDER — SODIUM CHLORIDE 0.9 % IV SOLN
250.0000 mL | INTRAVENOUS | Status: DC
  Administered 2016-02-18: 250 mL via INTRAVENOUS

## 2016-02-18 MED ORDER — INSULIN ASPART 100 UNIT/ML ~~LOC~~ SOLN
2.0000 [IU] | SUBCUTANEOUS | Status: DC
  Administered 2016-02-18: 2 [IU] via SUBCUTANEOUS
  Administered 2016-02-18: 4 [IU] via SUBCUTANEOUS
  Administered 2016-02-19 (×2): 2 [IU] via SUBCUTANEOUS
  Administered 2016-02-19: 4 [IU] via SUBCUTANEOUS
  Administered 2016-02-19 – 2016-02-20 (×6): 2 [IU] via SUBCUTANEOUS
  Administered 2016-02-20: 4 [IU] via SUBCUTANEOUS
  Administered 2016-02-21 (×4): 2 [IU] via SUBCUTANEOUS
  Administered 2016-02-21 (×2): 4 [IU] via SUBCUTANEOUS
  Administered 2016-02-21 – 2016-02-24 (×4): 2 [IU] via SUBCUTANEOUS

## 2016-02-18 MED ORDER — ACETAMINOPHEN 160 MG/5ML PO SOLN
650.0000 mg | Freq: Four times a day (QID) | ORAL | Status: DC | PRN
  Administered 2016-02-21 – 2016-03-04 (×5): 650 mg via ORAL
  Filled 2016-02-18 (×5): qty 20.3

## 2016-02-18 MED ORDER — FUROSEMIDE 10 MG/ML IJ SOLN
80.0000 mg | Freq: Two times a day (BID) | INTRAMUSCULAR | Status: DC
  Administered 2016-02-18 (×2): 80 mg via INTRAVENOUS
  Filled 2016-02-18 (×2): qty 8

## 2016-02-18 MED ORDER — PANTOPRAZOLE SODIUM 40 MG PO PACK
40.0000 mg | PACK | ORAL | Status: DC
  Administered 2016-02-18 – 2016-03-10 (×22): 40 mg
  Filled 2016-02-18 (×21): qty 20

## 2016-02-18 MED ORDER — POTASSIUM CHLORIDE 10 MEQ/50ML IV SOLN
10.0000 meq | INTRAVENOUS | Status: AC
Start: 2016-02-18 — End: 2016-02-18
  Administered 2016-02-18 (×5): 10 meq via INTRAVENOUS
  Filled 2016-02-18 (×5): qty 50

## 2016-02-18 MED ORDER — INSULIN GLARGINE 100 UNIT/ML ~~LOC~~ SOLN
20.0000 [IU] | SUBCUTANEOUS | Status: DC
  Administered 2016-02-18 – 2016-02-26 (×8): 20 [IU] via SUBCUTANEOUS
  Filled 2016-02-18 (×17): qty 0.2

## 2016-02-18 MED ORDER — VITAL HIGH PROTEIN PO LIQD
1000.0000 mL | ORAL | Status: DC
  Administered 2016-02-18 – 2016-02-19 (×3): 1000 mL

## 2016-02-18 MED ORDER — VITAMIN B-1 100 MG PO TABS
100.0000 mg | ORAL_TABLET | Freq: Every day | ORAL | Status: DC
  Administered 2016-02-18 – 2016-03-10 (×22): 100 mg
  Filled 2016-02-18 (×22): qty 1

## 2016-02-18 MED ORDER — FOLIC ACID 1 MG PO TABS
1.0000 mg | ORAL_TABLET | Freq: Every day | ORAL | Status: DC
  Administered 2016-02-18 – 2016-03-10 (×22): 1 mg
  Filled 2016-02-18 (×23): qty 1

## 2016-02-18 MED ORDER — VITAL AF 1.2 CAL PO LIQD
1000.0000 mL | ORAL | Status: DC
  Administered 2016-02-18 – 2016-03-08 (×22): 1000 mL
  Filled 2016-02-18 (×14): qty 1000

## 2016-02-18 NOTE — Progress Notes (Signed)
Initial Nutrition Assessment  DOCUMENTATION CODES:   Not applicable  INTERVENTION:  -Initiate Vital AF 1.2 @ 25 ml/hr increasing by 10 ml every 12 hours until goal rate of 65 ml/hr is met  Provides 1872 kcal, 117 grams of protein, and 1265 ml of free water  NUTRITION DIAGNOSIS:   Inadequate oral intake related to inability to eat as evidenced by NPO status.  GOAL:   Patient will meet greater than or equal to 90% of their needs  MONITOR:   TF tolerance, Weight trends, Vent status, Labs  REASON FOR ASSESSMENT:   Consult Enteral/tube feeding initiation and management  ASSESSMENT:   50 yo homeless male smoker with hx of ETOH developed upper respiratory symptoms. Developed progressive confusion, lactic acidosis, AKI. 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 Suffered cardiac arrest, cardiogenic shock with acute systolic CHF, ARDS 2/58.  3/14-Intubated 3/14-OG tube placed  Pt is currently intubated and on ventilator support MV: 12.5 L/min Temp: 35.6C Propofol: none  Conducted nutrition focused physical exam, identified no muscle wasting, no fat wasting.  Noted potassium and phosphorus is low. Pt has a history of polysubstance abuse. Unable to obtain nutrition history, suspect nutrition was not adequate. Will advance TF slowly.   Medications reviewed; folic acid (folvite) tablet 1 mg daily, thiamine (vitamin B-1) tablet 100 mg daily Labs reviewed; sodium 134, potassium 3.1, phosphorus 1.7, Magnesium WNL   Diet Order:  Diet NPO time specified  Skin:  Reviewed, no issues  Last BM:  02/17/2016  Height:   Ht Readings from Last 1 Encounters:  02/16/16 5' 8.9" (1.75 m)    Weight:   Wt Readings from Last 1 Encounters:  02/18/16 153 lb 3.5 oz (69.5 kg)    Ideal Body Weight:  72.7 kg  BMI:  Body mass index is 22.69 kg/(m^2).  Estimated Nutritional Needs:   Kcal:  5277  Protein:  105-115 grams   Fluid:  1.8 L  EDUCATION NEEDS:   No education needs identified  at this time  [place], Dietetic Intern Pager: [phone]

## 2016-02-18 NOTE — Progress Notes (Signed)
eLink Physician-Brief Progress Note Patient Name: Dale DemarkGraig D Sudduth DOB: 04/14/1966 MRN: [id number]   Date of Service  02/18/2016  HPI/Events of Note  hypothermic  eICU Interventions  Warming blanket     Intervention Category Minor Interventions: Routine modifications to care plan (e.g. PRN medications for pain, fever)  Max FickleDouglas Kendarius Vigen 02/18/2016, 5:20 PM

## 2016-02-18 NOTE — Progress Notes (Signed)
ANTICOAGULATION CONSULT NOTE  Pharmacy Consult for heparin Indication: IABP  No Known Allergies  Patient Measurements: Height: 5' 8.9" (175 cm) Weight: 153 lb 3.5 oz (69.5 kg) IBW/kg (Calculated) : 70.47 Heparin Dosing Weight: 66.9 kg  Vital Signs: Temp: 97 F (36.1 C) (03/16 2100) Temp Source: Core (Comment) (03/16 2000) BP: 120/60 mmHg (03/16 2100) Pulse Rate: 98 (03/16 2100)  Labs:  Recent Labs  02/17/16 0033 02/17/16 0035 02/17/16 0053 02/17/16 0500  02/17/16 0845 02/17/16 1903 02/18/16 0215 02/18/16 0500 02/18/16 1015 02/18/16 1550 02/18/16 2015  HGB  --  11.0*  --  12.0*  --   --   --   --  11.0*  --   --   --   HCT  --  32.6*  --  33.6*  --   --   --   --  30.4*  --   --   --   PLT  --  120*  --  136*  --   --   --   --  144*  --   --   --   APTT  --   --  49*  --   --   --   --   --   --   --   --   --   LABPROT  --   --  14.8  --   --   --   --   --   --   --   --   --   INR  --   --  1.14  --   --   --   --   --   --   --   --   --   HEPARINUNFRC  --   --   --   --   < > <0.10*  --  0.11*  --  0.15*  --  0.32  CREATININE 1.86*  --   --  1.65*  --   --  1.82*  --  1.83*  --  1.79*  --   CKTOTAL 645*  --   --   --   --  540*  --   --   --   --   --   --   TROPONINI 13.02*  --   --  6.43*  --  4.74*  --   --   --   --   --   --   < > = values in this interval not displayed.  Estimated Creatinine Clearance: 48 mL/min (by C-G formula based on Cr of 1.79).   Medical History: Past Medical History  Diagnosis Date  . Depression    Assessment: 50 yo male with IABP started on heparin post cath 3/15. Heparin level is 0.32, therapeutic on rate of 1000 units/hr.  No bleeding reported.    Goal of Therapy:  Heparin level 0.2-0.5 units/ml Monitor platelets by anticoagulation protocol: Yes   Plan:  Continue IV heparin drip to 1000/hr units/hr.  Recheck 6hr heparin level to confirm remains therapeutic.  Daily heparin level and CBC  Noah Delaineuth Rosezetta Balderston, RPh Clinical  Pharmacist Pager: [phone]  02/18/2016 9:22 PM

## 2016-02-18 NOTE — Progress Notes (Signed)
PULMONARY / CRITICAL CARE MEDICINE   Name: Dale [name] MRN: [id number] DOB: 12/02/1966    ADMISSION DATE:  02/16/2016   CONSULTATION DATE:  02/16/16  REFERRING MD:  Rito Ehrlich  CHIEF COMPLAINT:  AMS  SUBJECTIVE:  Remains on paralytic, pressors, inotropes.  VITAL SIGNS: BP 130/74 mmHg  Pulse 101  Temp(Src) 96.8 F (36 C) (Rectal)  Resp 28  Ht 5' 8.9" (1.75 m)  Wt 153 lb 3.5 oz (69.5 kg)  BMI 22.69 kg/m2  SpO2 100%  HEMODYNAMICS: PAP: (35-74)/(23-56) 41/25 mmHg CVP:  [8 mmHg-17 mmHg] 14 mmHg PCWP:  [15 mmHg-17 mmHg] 16 mmHg CO:  [2.1 L/min-2.5 L/min] 2.4 L/min CI:  [1.2 L/min/m2-1.3 L/min/m2] 1.3 L/min/m2  VENTILATOR SETTINGS: Vent Mode:  [-] PRVC FiO2 (%):  [80 %-100 %] 80 % Set Rate:  [35 bmp] 35 bmp Vt Set:  [600 mL] 600 mL PEEP:  [16 cmH20-18 cmH20] 18 cmH20 Plateau Pressure:  [38 cmH20-41 cmH20] 39 cmH20  INTAKE / OUTPUT: I/O last 3 completed shifts: In: 10501.7 [I.V.:9681.7; NG/GT:60; IV Piggyback:760] Out: 2043 [Urine:1593; Emesis/NG output:450]  PHYSICAL EXAMINATION: General: paralyzed Neuro: RASS -5 HEENT: ETT in place Cardiac: regular Chest: b/l crackles Abd: soft Ext: cool Skin: no rashes  LABS:  BMET  Recent Labs Lab 02/17/16 0500 02/17/16 1903 02/18/16 0500  NA 134* 137 134*  K 4.4 3.7 3.1*  CL 106 102 102  CO2 14* 20* 18*  BUN 22* 28* 28*  CREATININE 1.65* 1.82* 1.83*  GLUCOSE 309* 248* 208*    Electrolytes  Recent Labs Lab 02/17/16 0500 02/17/16 1903 02/18/16 0500  CALCIUM 6.5* 6.2* 6.6*  MG 2.1 2.0 2.0  PHOS 3.4 2.7 1.7*    CBC  Recent Labs Lab 02/17/16 0035 02/17/16 0500 02/18/16 0500  WBC 15.4* 19.7* 22.1*  HGB 11.0* 12.0* 11.0*  HCT 32.6* 33.6* 30.4*  PLT 120* 136* 144*    Coag's  Recent Labs Lab 02/17/16 0053  APTT 49*  INR 1.14    Sepsis Markers  Recent Labs Lab 02/16/16 1717 02/16/16 2006 02/17/16 0030 02/17/16 0053  LATICACIDVEN 2.11* 1.5 4.9*  --   PROCALCITON  --   --   --  73.53     ABG  Recent Labs Lab 02/17/16 1142 02/17/16 1533 02/17/16 2146  PHART 7.272* 7.238* 7.483*  PCO2ART 26.4* 31.6* 21.6*  PO2ART 48.0* 55.0* 96.0    Liver Enzymes  Recent Labs Lab 02/16/16 1200 02/17/16 0033 02/18/16 0500  AST 117* 174* 515*  ALT 42 45 100*  ALKPHOS 56 39 42  BILITOT 1.1 1.7* 0.3  ALBUMIN 2.9* 1.7* 1.5*    Cardiac Enzymes  Recent Labs Lab 02/17/16 0033 02/17/16 0500 02/17/16 0845  TROPONINI 13.02* 6.43* 4.74*    Glucose  Recent Labs Lab 02/17/16 2031 02/18/16 0047 02/18/16 0302 02/18/16 0420 02/18/16 0501 02/18/16 0610  GLUCAP 236* 223* 234* 205* 191* 164*    Imaging Dg Chest Port 1 View  02/18/2016  CLINICAL DATA:  Entry aortic balloon pump asist. EXAM: PORTABLE CHEST 1 VIEW COMPARISON:  February 17, 2016. FINDINGS: The heart size and mediastinal contours are within normal limits. No pneumothorax or pleural effusion is noted. Endotracheal nasogastric tubes are unchanged in position. Right internal jugular Swan-Ganz catheter is unchanged with distal tip in expected position of right pulmonary artery. Distal tip of aortic balloon catheter is seen over expected position of superior portion of descending thoracic aorta just below aortic knob. Stable bilateral perihilar opacities are noted concerning for edema or inflammation. The visualized skeletal  structures are unremarkable. IMPRESSION: Distal tip of aortic balloon catheter appears to be slightly more inferior in position compared to prior exam, just below aortic knob currently. This projects over the expected position of the superior aspect of descending thoracic aorta. Stable bilateral perihilar opacities are noted concerning for edema or inflammation. Otherwise stable support apparatus. Electronically Signed   By: Lupita Raider, M.D.   On: 02/18/2016 07:24   Dg Chest Port 1 View  02/17/2016  CLINICAL DATA:  Ventilator dependence. EXAM: PORTABLE CHEST 1 VIEW COMPARISON:  Earlier the same day.  FINDINGS: 1822 hours. Endotracheal tube tip is 7.4 cm above the base of the carina. Right IJ pulmonary artery catheter tip is in the interlobar pulmonary artery. The tip of the intra-aortic balloon pump projects over the inferior aspect of the transverse aorta. The NG tube passes into the stomach although the distal tip position is not included on the film. Proximal port of the NG tube is at or just above the EG junction. Diffuse interstitial and central alveolar opacities suggest edema. Telemetry leads overlie the chest. IMPRESSION: The tip of the intra-aortic balloon pump projects over the inferior aspect of the transverse aorta. Electronically Signed   By: Kennith Center M.D.   On: 02/17/2016 18:34   Dg Chest Port 1 View  02/17/2016  CLINICAL DATA:  50 year old male with a history of Swan-Ganz catheter placement. EXAM: PORTABLE CHEST 1 VIEW COMPARISON:  02/17/2016 FINDINGS: Cardiomediastinal silhouette unchanged in size and contour. Skull pads again project over the thorax. Unchanged endotracheal tube, gastric tube. Interval placement of right IJ sheath, through which a Swan-Ganz catheter has been placed terminating in the right pulmonary arteries. Persisting bilateral airspace opacity IMPRESSION: Interval placement of right IJ sheath, through which a Swan-Ganz catheter has been placed and terminates in the right pulmonary arteries at the hilum. Similar appearance of bilateral airspace opacities, potentially infection and/or edema. Unchanged support apparatus as above. Signed, Yvone Neu. Loreta Ave, DO [facility] Radiology Electronically Signed   By: Gilmer Mor D.O.   On: 02/17/2016 13:53     STUDIES:  3/14 CT head > multifocal sinusitis, enlarged ventricles 3/14 LP >> glucose < 20, protein > 600, WBC 340 (88%N), 89 RBC  CULTURES: 3/14 Blood > GPC in pairs 3/14 Urine > 3/14 CSF > GPC in pairs  ANTIBIOTICS: 3/14 Ampicillin >> 3/14 3/14 Vancomycin  >> 3/14 Rocephin >>  SIGNIFICANT EVENTS: 3/14 Admit, VDRF, ID/cardiology/neurology consulted 3/15 Cardiac arrest, 5 mins, Asystole > VF > ST; ARDS, septic/cardiogenic shock >> IABP; start paralytic  LINES/TUBES: 3/14 ETT >> 3/15 Rt femoral CVL >> 3/15 Rt femoral aline >> 3/15 Rt IJ introducer >>   DISCUSSION: 50 yo homeless male smoker with hx of ETOH developed upper respiratory symptoms.  Developed progressive confusion, lactic acidosis, AKI. 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  Suffered cardiac arrest, cardiogenic shock with acute systolic CHF, ARDS 3/15.  ASSESSMENT / PLAN:  PULMONARY A:  Acute respiratory failure 2nd to ARDS, acute pulmonary edema. P:   Wean PEEP, FiO2 per ARDS protocol Goal 6 cc/kg for Vt F/u CXR, ABG  CARDIOVASCULAR A:  Septic/cardiogenic shock >> mostly cardiogenic now. Acute systolic CHF. Cardiac arrest 3/15. NSTEMI. P:  Follow up Echo Lasix, lopressor, IABP, amiodarone, dobutamine, milrinone per cardiology If no improvement, might need ECMO  RENAL A:   AKI 2nd to ATN. Lactic acidosis. Hypokalemia. P:   ASA, lopressor, lasix, amiodarone gtt, heparin gtt Continue HCO3 gtt for now >> f/u ABG,  BMET  GASTROINTESTINAL A:   Nutrition. Shock liver. P:   Tube feeds Protonix F/u LFTs  HEMATOLOGIC A:   Leukocytosis. P:  F/u CBC  INFECTIOUS A:   Streptococcal bacteremia and meningitis. P:   Day 3 vancomycin, ceftriaxone, decadron per ID  ENDOCRINE A:   Hyperglycemia. Relative adrenal insufficiency >> cortisol 15.2 from 3/15. P:   Insulin gtt Decadron  NEUROLOGIC A:   Acute metabolic encephalopathy. Hx of ETOH, THC. P:   Thiamine, folic acid RASS -5 while on paralytic  D/w Dr. Shirlee LatchMcLean  CC time 46 minutes.  Coralyn HellingVineet Cattie Tineo, MD [facility] Pulmonary/Critical Care 02/18/2016, 8:48 AM Pager:  [phone] After 3pm call: [phone]

## 2016-02-18 NOTE — Progress Notes (Signed)
[facility]   Reason for visit: Follow up on Strep pneumononia  Interval History: remains intubated, on balloon pump, Strep pneumonia sensitivities pending, on vancomycin, ceftriaxone, dexamethasone.    CXR independently reviewed and some edema noted  Physical Exam: Constitutional:  Filed Vitals:   02/18/16 1245 02/18/16 1300  BP:    Pulse: 93 92  Temp:  95.2 F (35.1 C)  Resp: 30 24   intubated, sedated Eyes: anicteric, pinpoint pupils HENT: intubated Respiratory: Vented Cardiovascular: RRR GI: soft, nt, nd  Review of Systems: Unable to be assessed due to mental status  Lab Results  Component Value Date   WBC 22.1* 02/18/2016   HGB 11.0* 02/18/2016   HCT 30.4* 02/18/2016   MCV 85.6 02/18/2016   PLT 144* 02/18/2016    Lab Results  Component Value Date   CREATININE 1.83* 02/18/2016   BUN 28* 02/18/2016   NA 134* 02/18/2016   K 3.1* 02/18/2016   CL 102 02/18/2016   CO2 18* 02/18/2016    Lab Results  Component Value Date   ALT 100* 02/18/2016   AST 515* 02/18/2016   ALKPHOS 42 02/18/2016     Microbiology: Recent Results (from the past 240 hour(s))  Urine culture     Status: None   Collection Time: 02/16/16 11:40 AM  Result Value Ref Range Status   Specimen Description URINE, CATHETERIZED  Final   Special Requests NONE  Final   Culture NO GROWTH 1 DAY  Final   Report Status 02/17/2016 FINAL  Final  Blood Culture (routine x 2)     Status: None (Preliminary result)   Collection Time: 02/16/16 11:53 AM  Result Value Ref Range Status   Specimen Description BLOOD RIGHT ANTECUBITAL  Final   Special Requests BOTTLES DRAWN AEROBIC AND ANAEROBIC 5CC  Final   Culture  Setup Time   Final    GRAM POSITIVE COCCI IN CHAINS IN PAIRS IN BOTH AEROBIC AND ANAEROBIC BOTTLES CRITICAL RESULT CALLED TO, READ BACK BY AND VERIFIED WITH: B WOODSON,RN @0346  02/17/16 MKELLY    Culture   Final    STREPTOCOCCUS PNEUMONIAE SUSCEPTIBILITIES TO  FOLLOW    Report Status PENDING  Incomplete  Blood Culture (routine x 2)     Status: None (Preliminary result)   Collection Time: 02/16/16 12:00 PM  Result Value Ref Range Status   Specimen Description BLOOD RIGHT HAND  Final   Special Requests BOTTLES DRAWN AEROBIC AND ANAEROBIC 5CC  Final   Culture  Setup Time   Final    GRAM POSITIVE COCCI IN CHAINS IN PAIRS IN BOTH AEROBIC AND ANAEROBIC BOTTLES CRITICAL RESULT CALLED TO, READ BACK BY AND VERIFIED WITH: B WOODSON,RN @0346  02/17/16 MKELLY    Culture STREPTOCOCCUS PNEUMONIAE  Final   Report Status PENDING  Incomplete  CSF culture with Stat gram stain     Status: None (Preliminary result)   Collection Time: 02/16/16  4:44 PM  Result Value Ref Range Status   Specimen Description CSF  Final   Special Requests NONE  Final   Gram Stain   Final    WBC PRESENT,BOTH PMN AND MONONUCLEAR GRAM POSITIVE COCCI IN PAIRS Gram Stain Report Called to,Read Back By and Verified With: Tyrone AppleN STEPHENS RN 1806 02/16/16 A BROWNING CYTOSPIN    Culture   Final    FEW STREPTOCOCCUS PNEUMONIAE SUSCEPTIBILITIES TO FOLLOW    Report Status PENDING  Incomplete  MRSA PCR Screening     Status: None   Collection Time:  02/16/16 11:11 PM  Result Value Ref Range Status   MRSA by PCR NEGATIVE NEGATIVE Final    Comment:        The GeneXpert MRSA Assay (FDA approved for NASAL specimens only), is one component of a comprehensive MRSA colonization surveillance program. It is not intended to diagnose MRSA infection nor to guide or monitor treatment for MRSA infections.   Culture, blood (x 2)     Status: None (Preliminary result)   Collection Time: 02/17/16  1:00 AM  Result Value Ref Range Status   Specimen Description BLOOD RIGHT ANTECUBITAL  Final   Special Requests   Final    BOTTLES DRAWN AEROBIC AND ANAEROBIC 4CC BLUE,5CC RED   Culture NO GROWTH 1 DAY  Final   Report Status PENDING  Incomplete  Culture, blood (x 2)     Status: None (Preliminary result)    Collection Time: 02/17/16  1:00 AM  Result Value Ref Range Status   Specimen Description BLOOD A-LINE  Final   Special Requests BOTTLES DRAWN AEROBIC AND ANAEROBIC 5CC   Final   Culture NO GROWTH 1 DAY  Final   Report Status PENDING  Incomplete    Impression/Plan:  1. Strep pneumonia meningitis - on vanco/ceftrix/dexa pending sensitivities.  Will need 4 days dexamethasone.    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

## 2016-02-18 NOTE — Progress Notes (Signed)
  Echocardiogram 2D Echocardiogram has been performed.  Dale [name], Vernel Langenderfer M 02/18/2016, 1:32 PM

## 2016-02-18 NOTE — Progress Notes (Signed)
ANTICOAGULATION CONSULT NOTE - Follow Up Consult  Pharmacy Consult for heparin Indication: IABP  Labs:  Recent Labs  02/16/16 2016 02/17/16 0033 02/17/16 0035 02/17/16 0053 02/17/16 0500 02/17/16 0845 02/17/16 1903 02/18/16 0215  HGB 12.8*  --  11.0*  --  12.0*  --   --   --   HCT 36.7*  --  32.6*  --  33.6*  --   --   --   PLT 137*  --  120*  --  136*  --   --   --   APTT  --   --   --  49*  --   --   --   --   LABPROT  --   --   --  14.8  --   --   --   --   INR  --   --   --  1.14  --   --   --   --   HEPARINUNFRC  --   --   --   --   --  <0.10*  --  0.11*  CREATININE 1.28* 1.86*  --   --  1.65*  --  1.82*  --   CKTOTAL  --  645*  --   --   --  540*  --   --   TROPONINI 7.59* 13.02*  --   --  6.43* 4.74*  --   --      Assessment: 50yo male subtherapeutic on heparin with initial dosing for IABP.  Goal of Therapy:  Heparin level 0.2-0.5 units/ml   Plan:  Will increase heparin gtt by 2 units/kg/hr to 850 units/hr and check level in 8hr.  Vernard GamblesVeronda Siddarth Hsiung, PharmD, BCPS  02/18/2016,2:47 AM

## 2016-02-18 NOTE — Progress Notes (Addendum)
Patient ID: Dale [name], male   DOB: 08/23/1966, 50 y.o.   MRN: [id number]    SUBJECTIVE: BP improved this morning.  Still low output.  IABP in place and augmenting appropriately.  Norepi down to 32, on vasopressin 0.03, dobuta 5, milrinone 0.125.   Remains on 100% FiO2, 18 PEEP. Sats 100%.   CXR: Bilateral perihilar opacities, IABP in proper place.   Swan: CVP 18 PA 35/23 PCWP 19 CI 1.3 Co-ox 51% SVR 2797  Scheduled Meds: . antiseptic oral rinse  7 mL Mouth Rinse 10 times per day  . artificial tears  1 application Both Eyes 3 times per day  . aspirin  325 mg Oral Daily  . cefTRIAXone (ROCEPHIN)  IV  2 g Intravenous Q12H  . chlorhexidine gluconate  15 mL Mouth Rinse BID  . dexamethasone  10 mg Intravenous Q6H  . fentaNYL (SUBLIMAZE) injection  100 mcg Intravenous Once  . folic acid  1 mg Intravenous Daily  . furosemide  80 mg Intravenous BID  . metoprolol  2.5 mg Intravenous 4 times per day  . midazolam  2 mg Intravenous Once  . pantoprazole (PROTONIX) IV  40 mg Intravenous Daily  . potassium chloride  10 mEq Intravenous Q1 Hr x 5  . sodium chloride flush  10-40 mL Intracatheter Q12H  . thiamine  100 mg Intravenous Daily  . vancomycin  500 mg Intravenous Q12H   Continuous Infusions: . sodium chloride Stopped (02/17/16 1800)  . amiodarone 30 mg/hr (02/17/16 2053)  . cisatracurium (NIMBEX) infusion 1.5 mcg/kg/min (02/18/16 0330)  . DOBUTamine 7.5 mcg/kg/min (02/18/16 0745)  . fentaNYL infusion INTRAVENOUS 225 mcg/hr (02/18/16 0230)  . heparin 850 Units/hr (02/18/16 0230)  . insulin (NOVOLIN-R) infusion 3.8 mL/hr at 02/18/16 0700  . midazolam (VERSED) infusion 4 mg/hr (02/18/16 0321)  . milrinone 0.25 mcg/kg/min (02/18/16 0745)  . norepinephrine (LEVOPHED) Adult infusion 32 mcg/min (02/18/16 0700)  .  sodium bicarbonate  infusion 1000 mL 40 mL/hr at 02/17/16 2000   PRN Meds:.sodium chloride, sodium chloride, acetaminophen, albuterol, fentaNYL, fentaNYL, fentaNYL  (SUBLIMAZE) injection, midazolam, midazolam, midazolam, midazolam, sodium chloride flush    Filed Vitals:   02/18/16 0406 02/18/16 0500 02/18/16 0600 02/18/16 0700  BP:  92/55 107/70 110/66  Pulse:    101  Temp: 97.7 F (36.5 C) 97.7 F (36.5 C) 97.7 F (36.5 C) 97.5 F (36.4 C)  TempSrc:      Resp: 35 35 35 35  Height:      Weight:  153 lb 3.5 oz (69.5 kg)    SpO2:    100%    Intake/Output Summary (Last 24 hours) at 02/18/16 0744 Last data filed at 02/18/16 0700  Gross per 24 hour  Intake 6043.11 ml  Output    643 ml  Net 5400.11 ml    LABS: Basic Metabolic Panel:  Recent Labs  40/98/11 1903 02/18/16 0500  NA 137 134*  K 3.7 3.1*  CL 102 102  CO2 20* 18*  GLUCOSE 248* 208*  BUN 28* 28*  CREATININE 1.82* 1.83*  CALCIUM 6.2* 6.6*  MG 2.0 2.0  PHOS 2.7 1.7*   Liver Function Tests:  Recent Labs  02/17/16 0033 02/18/16 0500  AST 174* 515*  ALT 45 100*  ALKPHOS 39 42  BILITOT 1.7* 0.3  PROT 5.0* 4.9*  ALBUMIN 1.7* 1.5*   No results for input(s): LIPASE, AMYLASE in the last 72 hours. CBC:  Recent Labs  02/16/16 1200  02/17/16 0500 02/18/16 0500  WBC 18.4*  < >  19.7* 22.1*  NEUTROABS 14.5*  --   --  20.0*  HGB 13.9  < > 12.0* 11.0*  HCT 37.9*  < > 33.6* 30.4*  MCV 87.7  < > 88.4 85.6  PLT 155  < > 136* 144*  < > = values in this interval not displayed. Cardiac Enzymes:  Recent Labs  02/17/16 0033 02/17/16 0500 02/17/16 0845  CKTOTAL 645*  --  540*  TROPONINI 13.02* 6.43* 4.74*   BNP: Invalid input(s): POCBNP D-Dimer: No results for input(s): DDIMER in the last 72 hours. Hemoglobin A1C: No results for input(s): HGBA1C in the last 72 hours. Fasting Lipid Panel:  Recent Labs  02/16/16 2016  TRIG 54   Thyroid Function Tests: No results for input(s): TSH, T4TOTAL, T3FREE, THYROIDAB in the last 72 hours.  Invalid input(s): FREET3 Anemia Panel: No results for input(s): VITAMINB12, FOLATE, FERRITIN, TIBC, IRON, RETICCTPCT in the  last 72 hours.  RADIOLOGY: Dg Chest 2 View  02/13/2016  CLINICAL DATA:  51 year old male with history of anterior chest pain for the past 2 days. Low-grade fever. Dizziness. EXAM: CHEST  2 VIEW COMPARISON:  Chest x-ray 06/01/2009. FINDINGS: Lung volumes are normal. No consolidative airspace disease. No pleural effusions. No pneumothorax. No pulmonary nodule or mass noted. Pulmonary vasculature and the cardiomediastinal silhouette are within normal limits. IMPRESSION: No radiographic evidence of acute cardiopulmonary disease. Electronically Signed   By: Trudie Reed M.D.   On: 02/13/2016 17:57   Dg Thoracic Spine 2 View  02/13/2016  CLINICAL DATA:  O thoracic and lower back pain for 1 week. Fall 2 days ago. Pain was before the fall. EXAM: THORACIC SPINE 2 VIEWS COMPARISON:  Chest x-ray today and 06/01/2009 FINDINGS: There is no evidence of thoracic spine fracture. Alignment is normal. No other significant bone abnormalities are identified. IMPRESSION: Negative. Electronically Signed   By: Charlett Nose M.D.   On: 02/13/2016 20:45   Dg Lumbar Spine Complete  02/13/2016  CLINICAL DATA:  Low back pain and recent fall. EXAM: LUMBAR SPINE - COMPLETE 4+ VIEW COMPARISON:  08/25/2014 lumbar spine radiographs. FINDINGS: This report assumes 5 non rib-bearing lumbar vertebrae. Lumbar vertebral body heights are preserved, with no fracture. There is mild loss of disc height at L4-5 with associated minimal spondylosis, not appreciably changed. Otherwise preserved lumbar disc spaces. There is new 3 mm anterolisthesis at L4-5. No appreciable facet arthropathy. No appreciable foraminal stenosis. No aggressive appearing focal osseous lesions. IMPRESSION: 1. No lumbar spine fracture. 2. Stable mild degenerative disc disease at L4-5. 3. New mild 3 mm anterolisthesis at L4-5. Electronically Signed   By: Delbert Phenix M.D.   On: 02/13/2016 20:47   Ct Head Wo Contrast  02/16/2016  CLINICAL DATA:  Fever and altered mental  status; tremors EXAM: CT HEAD WITHOUT CONTRAST TECHNIQUE: Contiguous axial images were obtained from the base of the skull through the vertex without intravenous contrast. COMPARISON:  June 01, 2009 FINDINGS: The ventricles are mildly enlarged in a generalized manner and appear larger compared to the prior study. The sulci appear unremarkable. There is no intracranial mass, hemorrhage, extra-axial fluid collection, or midline shift. No focal gray-white compartment lesions are identified. No evidence of acute infarct. Bony calvarium appears intact. The mastoid air cells are clear. There is opacification in the left maxillary antrum as well as in multiple ethmoid air cells bilaterally. There is mild mucosal thickening in the left sphenoid sinus region. No intraorbital lesions are apparent. IMPRESSION: Ventricles mildly enlarged with sulci appearing normal. This  appearance potentially could represent a degree of normal pressure hydrocephalus. This finding also could be secondary to prior or recent meningitis meningitis. No intracranial mass, hemorrhage, or extra-axial fluid collection. No acute infarct evident. Multifocal sinusitis. Given these current findings, brain MRI pre and post-contrast may be helpful to further assess, in particular to further evaluate the meningeal regions. Electronically Signed   By: Bretta Bang III M.D.   On: 02/16/2016 14:04   Dg Chest Port 1 View  02/18/2016  CLINICAL DATA:  Entry aortic balloon pump asist. EXAM: PORTABLE CHEST 1 VIEW COMPARISON:  February 17, 2016. FINDINGS: The heart size and mediastinal contours are within normal limits. No pneumothorax or pleural effusion is noted. Endotracheal nasogastric tubes are unchanged in position. Right internal jugular Swan-Ganz catheter is unchanged with distal tip in expected position of right pulmonary artery. Distal tip of aortic balloon catheter is seen over expected position of superior portion of descending thoracic aorta just  below aortic knob. Stable bilateral perihilar opacities are noted concerning for edema or inflammation. The visualized skeletal structures are unremarkable. IMPRESSION: Distal tip of aortic balloon catheter appears to be slightly more inferior in position compared to prior exam, just below aortic knob currently. This projects over the expected position of the superior aspect of descending thoracic aorta. Stable bilateral perihilar opacities are noted concerning for edema or inflammation. Otherwise stable support apparatus. Electronically Signed   By: Lupita Raider, M.D.   On: 02/18/2016 07:24   Dg Chest Port 1 View  02/17/2016  CLINICAL DATA:  Ventilator dependence. EXAM: PORTABLE CHEST 1 VIEW COMPARISON:  Earlier the same day. FINDINGS: 1822 hours. Endotracheal tube tip is 7.4 cm above the base of the carina. Right IJ pulmonary artery catheter tip is in the interlobar pulmonary artery. The tip of the intra-aortic balloon pump projects over the inferior aspect of the transverse aorta. The NG tube passes into the stomach although the distal tip position is not included on the film. Proximal port of the NG tube is at or just above the EG junction. Diffuse interstitial and central alveolar opacities suggest edema. Telemetry leads overlie the chest. IMPRESSION: The tip of the intra-aortic balloon pump projects over the inferior aspect of the transverse aorta. Electronically Signed   By: Kennith Center M.D.   On: 02/17/2016 18:34   Dg Chest Port 1 View  02/17/2016  CLINICAL DATA:  50 year old male with a history of Swan-Ganz catheter placement. EXAM: PORTABLE CHEST 1 VIEW COMPARISON:  02/17/2016 FINDINGS: Cardiomediastinal silhouette unchanged in size and contour. Skull pads again project over the thorax. Unchanged endotracheal tube, gastric tube. Interval placement of right IJ sheath, through which a Swan-Ganz catheter has been placed terminating in the right pulmonary arteries. Persisting bilateral airspace  opacity IMPRESSION: Interval placement of right IJ sheath, through which a Swan-Ganz catheter has been placed and terminates in the right pulmonary arteries at the hilum. Similar appearance of bilateral airspace opacities, potentially infection and/or edema. Unchanged support apparatus as above. Signed, Yvone Neu. Loreta Ave, DO [facility]iology Electronically Signed   By: Gilmer Mor D.O.   On: 02/17/2016 13:53   Dg Chest Port 1 View  02/17/2016  CLINICAL DATA:  Hypoxia EXAM: PORTABLE CHEST 1 VIEW COMPARISON:  February 16, 2016 FINDINGS: Endotracheal tube tip is 3.5 cm above the carina. Nasogastric tube tip and side port are in the stomach. No pneumothorax evident. There is interstitial edema throughout the lungs bilaterally, perhaps slightly increased from 1 day prior.  No airspace consolidation. Heart is upper normal in size with pulmonary vascularity within normal limits. No adenopathy. IMPRESSION: Tube positions as described without pneumothorax. Slight increase in interstitial edema. No airspace consolidation. No change in cardiac silhouette. Electronically Signed   By: Bretta Bang III M.D.   On: 02/17/2016 07:31   Dg Chest Portable 1 View  02/16/2016  CLINICAL DATA:  Endotracheal tube placement EXAM: PORTABLE CHEST 1 VIEW COMPARISON:  Chest x-ray from earlier same day. FINDINGS: Endotracheal tube well positioned with tip approximately 2 cm above the carina. Enteric tube passes below the diaphragm. Heart size is normal. There is central pulmonary vascular congestion and mild interstitial edema. Suspect small left pleural effusion. No pneumothorax. Osseous structures about the chest are unremarkable. IMPRESSION: 1. Endotracheal tube well positioned with tip approximately 2 cm above the carina. 2. Central pulmonary vascular congestion and mild bilateral interstitial edema suggesting mild volume overload/resuscitation efforts. No frank alveolar pulmonary  edema. Electronically Signed   By: Bary Richard M.D.   On: 02/16/2016 19:38   Dg Chest Port 1 View  02/16/2016  CLINICAL DATA:  Altered mental status.  Depression. EXAM: PORTABLE CHEST 1 VIEW COMPARISON:  02/13/2016. FINDINGS: Midline trachea. Normal heart size and mediastinal contours. Left costophrenic angle exclusion. No pleural effusion or pneumothorax. Clear lungs. IMPRESSION: No active disease. Electronically Signed   By: Jeronimo Greaves M.D.   On: 02/16/2016 13:48   Ct Portable Head W/o Cm  02/17/2016  CLINICAL DATA:  50 year old male with a history of hydrocephalus. Acute meningitis. EXAM: CT HEAD WITHOUT CONTRAST TECHNIQUE: Contiguous axial images were obtained from the base of the skull through the vertex without intravenous contrast. COMPARISON:  02/16/2016 FINDINGS: Unremarkable appearance of the calvarium without acute fracture or aggressive lesion. Unremarkable appearance of the scalp soft tissues. Unremarkable appearance of the bilateral orbits. Partially visualized endotracheal tube.  Oral enteric tube. Near complete opacification of the left maxillary sinus with frothy secretions. Trace secretions of the right maxillary sinus. Mucoperiosteal thickening of the ethmoid air cells and the sphenoid sinuses. Trace disease of the frontal sinuses. Mastoid air cells are clear. Layered fluid within the nasopharynx. Configuration the ventricles is similar to comparison with bifrontal diameter measuring 40 mm, unchanged from prior. Unchanged appearance of the hemispheric sulci. No acute intracranial hemorrhage.  No midline shift. Gray-white differentiation relatively maintained. IMPRESSION: Unchanged ventricular dilation and appearance of the cerebral sulci, with no evidence of acute hemorrhage or infarction. Signed, Yvone Neu. Loreta Ave, DO [facility]iology Electronically Signed   By: Gilmer Mor D.O.   On: 02/17/2016 07:23    PHYSICAL EXAM General:  Intubated/sedated.  Neck: JVP elevated, no thyromegaly or thyroid nodule.  Lungs: Crackles dependently bilaterally.  CV: Nondisplaced PMI.  Distant heart sounds, regular S1/S2, no S3/S4, no murmur.  No peripheral edema.  Abdomen: Soft, no hepatosplenomegaly, no distention.  Neurologic: Intubated/sedated.  Psych: Normal affect. Extremities: No clubbing or cyanosis. IABP in place, pedal pulses can be dopplered.   TELEMETRY: Reviewed telemetry pt in NSR  ASSESSMENT AND PLAN: 50 yo with minimal past medical history was admitted with confusion, prior URI symptoms.  Had LP concerning for meningitis and found to have strep bacteremia/meningitis.  Later developed shock requiring pressors and was intubated for airway protection.  Bedside echo showed EF about 10%.  Swan placed, noted cardiogenic shock and had IABP.   1. Shock: Primarily appears to have cardiogenic shock.  Co-ox up from 20% => 51% but still poor output by  co-ox and thermodilution (1.3 this morning).  Markedly low EF by bedside echo, concern for possible septic cardiomyopathy.  SVR very high this morning, BP stable now and have come down on norepinephrine.  - Continue IABP, hgb/plts ok.  He remains on heparin for IABP.  - Increase dobutamine to 7.5, increase milrinone to 0.25, stop vasopressin.  Repeat co-ox/thermodilution output in 2 hrs.  - If he remains with high oxygen requirement and low output will need ECMO => discuss with pulmonary and will alert Dr Donata ClayVan Trigt.  2. Acute hypoxemic respiratory failure: CXR looks like pulmonary edema.  FiO2 100% and PEEP 18 => sats currently 100%.  If he remains difficult to oxygenate, need to move towards ECMO.  Will turn down FiO2 this morning.   3. ID: Strep bacteremia with meningitis: On vancomycin and ceftriaxone. WBCs higher this morning.  4. AKI: Creatinine up to 1.8 but stable and making urine.  5. Acute systolic CHF: EF 11%10% on bedside echo.  As above, concern for septic cardiomyopathy.  CVP  18/PCWP 19 with low output and pressors/inotropes + IABP as above.  BP stable.  - Will add Lasix 80 mg IV bid now.  - As above, low threshold now for ECMO, discuss with pulmonary.  6. Elevated LFTs: Suspect shock liver.  7. Cardiac arrest: PEA arrest then ventricular fibrillation in ER.  Now on amiodarone in NSR.   40 min critical care time.   Dale [name] 02/18/2016 8:04 AM  Co-ox improved to 65% with changes made this morning.  Dale [name] 02/18/2016 11:04 AM

## 2016-02-18 NOTE — Progress Notes (Signed)
ANTICOAGULATION CONSULT NOTE  Pharmacy Consult for heparin Indication: IABP  No Known Allergies  Patient Measurements: Height: 5' 8.9" (175 cm) Weight: 153 lb 3.5 oz (69.5 kg) IBW/kg (Calculated) : 70.47 Heparin Dosing Weight: 66.9 kg  Vital Signs: Temp: 95.4 F (35.2 C) (03/16 1030) Temp Source: Core (Comment) (03/16 0400) BP: 131/73 mmHg (03/16 1218) Pulse Rate: 94 (03/16 1218)  Labs:  Recent Labs  02/17/16 0033 02/17/16 0035 02/17/16 0053 02/17/16 0500 02/17/16 0845 02/17/16 1903 02/18/16 0215 02/18/16 0500 02/18/16 1015  HGB  --  11.0*  --  12.0*  --   --   --  11.0*  --   HCT  --  32.6*  --  33.6*  --   --   --  30.4*  --   PLT  --  120*  --  136*  --   --   --  144*  --   APTT  --   --  49*  --   --   --   --   --   --   LABPROT  --   --  14.8  --   --   --   --   --   --   INR  --   --  1.14  --   --   --   --   --   --   HEPARINUNFRC  --   --   --   --  <0.10*  --  0.11*  --  0.15*  CREATININE 1.86*  --   --  1.65*  --  1.82*  --  1.83*  --   CKTOTAL 645*  --   --   --  540*  --   --   --   --   TROPONINI 13.02*  --   --  6.43* 4.74*  --   --   --   --     Estimated Creatinine Clearance: 46.9 mL/min (by C-G formula based on Cr of 1.83).   Medical History: Past Medical History  Diagnosis Date  . Depression    Assessment: 50 yo male with IABP started on heparin post cath 3/15. HL continues to be low this am after rate adjustment.  No bleeding issues noted, hgb trended down to 11 this am.   D: Vanc/CTX D#3 for r/o meningitis - Tmax 100.1, WBC 22, SCr 1.8, PCT 75, LA 4.9 - CSF with elevated protein, low gluc, high WBC, high RBC - dexamethasone added 3/15  3/14 CTX >>  3/14 vanc >> 3/14 amp >>3/15  3/14 BCx: strep pneumo 3/14 UCx:  Sent 3/14 CSF: strep pneumo 3/14 MRSA - NEG   Goal of Therapy:  Heparin level 0.2-0.5 units/ml Monitor platelets by anticoagulation protocol: Yes   Plan:  - Increase heparin drip to 1000/hr units/hr. No  bolus. - Recheck 6hr heparin level - Daily heparin level and CBC - No change in antibiotics today - Follow up sensitivities in am, check vancomycin trough 3/17 if continues  Sheppard CoilFrank Wilson PharmD., BCPS Clinical Pharmacist Pager [phone] 02/18/2016 12:27 PM

## 2016-02-19 ENCOUNTER — Inpatient Hospital Stay (HOSPITAL_COMMUNITY): Payer: Medicaid Other

## 2016-02-19 DIAGNOSIS — G9341 Metabolic encephalopathy: Secondary | ICD-10-CM

## 2016-02-19 DIAGNOSIS — R6521 Severe sepsis with septic shock: Secondary | ICD-10-CM

## 2016-02-19 DIAGNOSIS — A419 Sepsis, unspecified organism: Secondary | ICD-10-CM

## 2016-02-19 LAB — GLUCOSE, CAPILLARY
GLUCOSE-CAPILLARY: 124 mg/dL — AB (ref 65–99)
GLUCOSE-CAPILLARY: 127 mg/dL — AB (ref 65–99)
Glucose-Capillary: 118 mg/dL — ABNORMAL HIGH (ref 65–99)
Glucose-Capillary: 164 mg/dL — ABNORMAL HIGH (ref 65–99)
Glucose-Capillary: 142 mg/dL — ABNORMAL HIGH (ref 65–99)
Glucose-Capillary: 138 mg/dL — ABNORMAL HIGH (ref 65–99)

## 2016-02-19 LAB — HEPARIN LEVEL (UNFRACTIONATED)
HEPARIN UNFRACTIONATED: 0.19 [IU]/mL — AB (ref 0.30–0.70)
HEPARIN UNFRACTIONATED: 0.26 [IU]/mL — AB (ref 0.30–0.70)

## 2016-02-19 LAB — BLOOD GAS, ARTERIAL
ACID-BASE EXCESS: 2.8 mmol/L — AB (ref 0.0–2.0)
Bicarbonate: 25.4 mEq/L — ABNORMAL HIGH (ref 20.0–24.0)
FIO2: 0.4
O2 Saturation: 97.8 %
PEEP: 5 cmH2O
PH ART: 7.553 — AB (ref 7.350–7.450)
Patient temperature: 96.8
RATE: 30 resp/min
TCO2: 26.3 mmol/L (ref 0–100)
VT: 410 mL
pCO2 arterial: 28.6 mmHg — ABNORMAL LOW (ref 35.0–45.0)
pO2, Arterial: 101 mmHg — ABNORMAL HIGH (ref 80.0–100.0)

## 2016-02-19 LAB — COMPREHENSIVE METABOLIC PANEL
ALK PHOS: 62 U/L (ref 38–126)
ALT: 83 U/L — AB (ref 17–63)
AST: 142 U/L — ABNORMAL HIGH (ref 15–41)
Albumin: 1.5 g/dL — ABNORMAL LOW (ref 3.5–5.0)
Anion gap: 12 (ref 5–15)
BUN: 30 mg/dL — ABNORMAL HIGH (ref 6–20)
CALCIUM: 7 mg/dL — AB (ref 8.9–10.3)
CO2: 25 mmol/L (ref 22–32)
CREATININE: 1.86 mg/dL — AB (ref 0.61–1.24)
Chloride: 105 mmol/L (ref 101–111)
GFR, EST AFRICAN AMERICAN: 47 mL/min — AB (ref >60)
GFR, EST NON AFRICAN AMERICAN: 40 mL/min — AB (ref >60)
Glucose, Bld: 116 mg/dL — ABNORMAL HIGH (ref 65–99)
Potassium: 3.4 mmol/L — ABNORMAL LOW (ref 3.5–5.1)
Sodium: 142 mmol/L (ref 135–145)
Total Bilirubin: 0.3 mg/dL (ref 0.3–1.2)
Total Protein: 5 g/dL — ABNORMAL LOW (ref 6.5–8.1)

## 2016-02-19 LAB — CULTURE, BLOOD (ROUTINE X 2)

## 2016-02-19 LAB — CARBOXYHEMOGLOBIN
Carboxyhemoglobin: 0.9 % (ref 0.5–1.5)
Methemoglobin: 1.3 % (ref 0.0–1.5)
O2 Saturation: 75.4 %
TOTAL HEMOGLOBIN: 9.4 g/dL — AB (ref 13.5–18.0)

## 2016-02-19 LAB — CSF CULTURE

## 2016-02-19 LAB — CSF CULTURE W GRAM STAIN

## 2016-02-19 LAB — PHOSPHORUS: PHOSPHORUS: 1.7 mg/dL — AB (ref 2.5–4.6)

## 2016-02-19 LAB — CBC
HEMATOCRIT: 29.2 % — AB (ref 39.0–52.0)
HEMOGLOBIN: 10.2 g/dL — AB (ref 13.0–17.0)
MCH: 29.8 pg (ref 26.0–34.0)
MCHC: 34.9 g/dL (ref 30.0–36.0)
MCV: 85.4 fL (ref 78.0–100.0)
PLATELETS: 161 10*3/uL (ref 150–400)
RBC: 3.42 MIL/uL — ABNORMAL LOW (ref 4.22–5.81)
RDW: 13.1 % (ref 11.5–15.5)
WBC: 21.5 10*3/uL — AB (ref 4.0–10.5)

## 2016-02-19 LAB — MAGNESIUM: MAGNESIUM: 2 mg/dL (ref 1.7–2.4)

## 2016-02-19 MED ORDER — SODIUM PHOSPHATE 3 MMOLE/ML IV SOLN
30.0000 mmol | Freq: Once | INTRAVENOUS | Status: AC
  Administered 2016-02-19: 30 mmol via INTRAVENOUS
  Filled 2016-02-19: qty 10

## 2016-02-19 MED ORDER — POTASSIUM CHLORIDE 10 MEQ/50ML IV SOLN
10.0000 meq | INTRAVENOUS | Status: AC
  Administered 2016-02-19 (×4): 10 meq via INTRAVENOUS
  Filled 2016-02-19 (×4): qty 50

## 2016-02-19 MED ORDER — SPIRONOLACTONE 25 MG PO TABS
12.5000 mg | ORAL_TABLET | Freq: Every day | ORAL | Status: DC
  Administered 2016-02-19: 12.5 mg
  Filled 2016-02-19 (×2): qty 1

## 2016-02-19 MED ORDER — POTASSIUM PHOSPHATES 15 MMOLE/5ML IV SOLN
30.0000 mmol | Freq: Once | INTRAVENOUS | Status: DC
  Filled 2016-02-19: qty 10

## 2016-02-19 MED ORDER — MILRINONE IN DEXTROSE 20 MG/100ML IV SOLN
0.1250 ug/kg/min | INTRAVENOUS | Status: DC
  Filled 2016-02-19: qty 100

## 2016-02-19 NOTE — Progress Notes (Signed)
Patient ID: Dale [name], male   DOB: 1966-10-18, 50 y.o.   MRN: [id number]    SUBJECTIVE: Improved today, cardiac output significantly better.  IABP in place and augmenting appropriately.  Norepi down to 5, off vasopressin.  On dobutamine 5 and milrinone 0.25. He diuresed well with IV Lasix.   FiO2 down to 0.4 with PEEP 5.   Swan: CVP 8 PA 35/23 PCWP 19 CI 1.3 Co-ox 51% SVR 2797  Scheduled Meds: . antiseptic oral rinse  7 mL Mouth Rinse 10 times per day  . artificial tears  1 application Both Eyes 3 times per day  . aspirin  325 mg Per Tube Daily  . cefTRIAXone (ROCEPHIN)  IV  2 g Intravenous Q12H  . chlorhexidine gluconate  15 mL Mouth Rinse BID  . dexamethasone  10 mg Intravenous Q6H  . feeding supplement (VITAL HIGH PROTEIN)  1,000 mL Per Tube Q24H  . folic acid  1 mg Per Tube Daily  . furosemide  80 mg Intravenous BID  . insulin aspart  2-6 Units Subcutaneous 6 times per day  . insulin aspart  3 Units Subcutaneous 6 times per day  . insulin glargine  20 Units Subcutaneous Q24H  . metoprolol  2.5 mg Intravenous 4 times per day  . pantoprazole sodium  40 mg Per Tube Q24H  . potassium phosphate IVPB (mmol)  30 mmol Intravenous Once  . sodium chloride flush  10-40 mL Intracatheter Q12H  . thiamine  100 mg Per Tube Daily  . vancomycin  500 mg Intravenous Q12H   Continuous Infusions: . sodium chloride 250 mL (02/18/16 0845)  . amiodarone 30 mg/hr (02/18/16 2149)  . cisatracurium (NIMBEX) infusion 3 mcg/kg/min (02/18/16 2000)  . DOBUTamine 2.5 mcg/kg/min (02/19/16 0734)  . feeding supplement (VITAL AF 1.2 CAL) 1,000 mL (02/19/16 0615)  . fentaNYL infusion INTRAVENOUS 200 mcg/hr (02/19/16 0700)  . heparin 1,100 Units/hr (02/19/16 0530)  . insulin (NOVOLIN-R) infusion Stopped (02/18/16 1700)  . midazolam (VERSED) infusion 3 mg/hr (02/19/16 0700)  . milrinone 0.25 mcg/kg/min (02/18/16 2149)  . norepinephrine (LEVOPHED) Adult infusion 5 mcg/min (02/19/16 0600)   PRN  Meds:.acetaminophen (TYLENOL) oral liquid 160 mg/5 mL, albuterol, fentaNYL, fentaNYL, fentaNYL (SUBLIMAZE) injection, midazolam, midazolam, midazolam, sodium chloride flush    Filed Vitals:   02/19/16 0500 02/19/16 0530 02/19/16 0600 02/19/16 0700  BP: 103/60 107/60 109/57 103/49  Pulse: 99 94 100 110  Temp: 99 F (37.2 C) 98.4 F (36.9 C) 98.8 F (37.1 C) 99.7 F (37.6 C)  TempSrc:      Resp: Height:      Weight: 149 lb 4 oz (67.7 kg)     SpO2: 98% 97% 97% 95%    Intake/Output Summary (Last 24 hours) at 02/19/16 0735 Last data filed at 02/19/16 0700  Gross per 24 hour  Intake 3518.01 ml  Output   6000 ml  Net -2481.99 ml    LABS: Basic Metabolic Panel:  Recent Labs  40/98/11 0500 02/18/16 1550 02/19/16 0400  NA 134* 138 142  K 3.1* 3.6 3.4*  CL 102 101 105  CO2 18* 22 25  GLUCOSE 208* 148* 116*  BUN 28* 28* 30*  CREATININE 1.83* 1.79* 1.86*  CALCIUM 6.6* 6.8* 7.0*  MG 2.0  --  2.0  PHOS 1.7*  --  1.7*   Liver Function Tests:  Recent Labs  02/18/16 0500 02/19/16 0400  AST 515* 142*  ALT 100* 83*  ALKPHOS 42 62  BILITOT  0.3 0.3  PROT 4.9* 5.0*  ALBUMIN 1.5* 1.5*   No results for input(s): LIPASE, AMYLASE in the last 72 hours. CBC:  Recent Labs  02/16/16 1200  02/18/16 0500 02/19/16 0400  WBC 18.4*  < > 22.1* 21.5*  NEUTROABS 14.5*  --  20.0*  --   HGB 13.9  < > 11.0* 10.2*  HCT 37.9*  < > 30.4* 29.2*  MCV 87.7  < > 85.6 85.4  PLT 155  < > 144* 161  < > = values in this interval not displayed. Cardiac Enzymes:  Recent Labs  02/17/16 0033 02/17/16 0500 02/17/16 0845  CKTOTAL 645*  --  540*  TROPONINI 13.02* 6.43* 4.74*   BNP: Invalid input(s): POCBNP D-Dimer: No results for input(s): DDIMER in the last 72 hours. Hemoglobin A1C: No results for input(s): HGBA1C in the last 72 hours. Fasting Lipid Panel:  Recent Labs  02/16/16 2016  TRIG 54   Thyroid Function Tests: No results for input(s): TSH, T4TOTAL, T3FREE,  THYROIDAB in the last 72 hours.  Invalid input(s): FREET3 Anemia Panel: No results for input(s): VITAMINB12, FOLATE, FERRITIN, TIBC, IRON, RETICCTPCT in the last 72 hours.  RADIOLOGY: Dg Chest 2 View  02/13/2016  CLINICAL DATA:  50 year old male with history of anterior chest pain for the past 2 days. Low-grade fever. Dizziness. EXAM: CHEST  2 VIEW COMPARISON:  Chest x-ray 06/01/2009. FINDINGS: Lung volumes are normal. No consolidative airspace disease. No pleural effusions. No pneumothorax. No pulmonary nodule or mass noted. Pulmonary vasculature and the cardiomediastinal silhouette are within normal limits. IMPRESSION: No radiographic evidence of acute cardiopulmonary disease. Electronically Signed   By: Trudie Reed M.D.   On: 02/13/2016 17:57   Dg Thoracic Spine 2 View  02/13/2016  CLINICAL DATA:  O thoracic and lower back pain for 1 week. Fall 2 days ago. Pain was before the fall. EXAM: THORACIC SPINE 2 VIEWS COMPARISON:  Chest x-ray today and 06/01/2009 FINDINGS: There is no evidence of thoracic spine fracture. Alignment is normal. No other significant bone abnormalities are identified. IMPRESSION: Negative. Electronically Signed   By: Charlett Nose M.D.   On: 02/13/2016 20:45   Dg Lumbar Spine Complete  02/13/2016  CLINICAL DATA:  Low back pain and recent fall. EXAM: LUMBAR SPINE - COMPLETE 4+ VIEW COMPARISON:  08/25/2014 lumbar spine radiographs. FINDINGS: This report assumes 5 non rib-bearing lumbar vertebrae. Lumbar vertebral body heights are preserved, with no fracture. There is mild loss of disc height at L4-5 with associated minimal spondylosis, not appreciably changed. Otherwise preserved lumbar disc spaces. There is new 3 mm anterolisthesis at L4-5. No appreciable facet arthropathy. No appreciable foraminal stenosis. No aggressive appearing focal osseous lesions. IMPRESSION: 1. No lumbar spine fracture. 2. Stable mild degenerative disc disease at L4-5. 3. New mild 3 mm anterolisthesis  at L4-5. Electronically Signed   By: Delbert Phenix M.D.   On: 02/13/2016 20:47   Ct Head Wo Contrast  02/16/2016  CLINICAL DATA:  Fever and altered mental status; tremors EXAM: CT HEAD WITHOUT CONTRAST TECHNIQUE: Contiguous axial images were obtained from the base of the skull through the vertex without intravenous contrast. COMPARISON:  June 01, 2009 FINDINGS: The ventricles are mildly enlarged in a generalized manner and appear larger compared to the prior study. The sulci appear unremarkable. There is no intracranial mass, hemorrhage, extra-axial fluid collection, or midline shift. No focal gray-white compartment lesions are identified. No evidence of acute infarct. Bony calvarium appears intact. The mastoid air cells are clear. There  is opacification in the left maxillary antrum as well as in multiple ethmoid air cells bilaterally. There is mild mucosal thickening in the left sphenoid sinus region. No intraorbital lesions are apparent. IMPRESSION: Ventricles mildly enlarged with sulci appearing normal. This appearance potentially could represent a degree of normal pressure hydrocephalus. This finding also could be secondary to prior or recent meningitis meningitis. No intracranial mass, hemorrhage, or extra-axial fluid collection. No acute infarct evident. Multifocal sinusitis. Given these current findings, brain MRI pre and post-contrast may be helpful to further assess, in particular to further evaluate the meningeal regions. Electronically Signed   By: Bretta BangWilliam  Woodruff III M.D.   On: 02/16/2016 14:04   Dg Chest Port 1 View  02/18/2016  CLINICAL DATA:  Entry aortic balloon pump asist. EXAM: PORTABLE CHEST 1 VIEW COMPARISON:  February 17, 2016. FINDINGS: The heart size and mediastinal contours are within normal limits. No pneumothorax or pleural effusion is noted. Endotracheal nasogastric tubes are unchanged in position. Right internal jugular Swan-Ganz catheter is unchanged with distal tip in expected  position of right pulmonary artery. Distal tip of aortic balloon catheter is seen over expected position of superior portion of descending thoracic aorta just below aortic knob. Stable bilateral perihilar opacities are noted concerning for edema or inflammation. The visualized skeletal structures are unremarkable. IMPRESSION: Distal tip of aortic balloon catheter appears to be slightly more inferior in position compared to prior exam, just below aortic knob currently. This projects over the expected position of the superior aspect of descending thoracic aorta. Stable bilateral perihilar opacities are noted concerning for edema or inflammation. Otherwise stable support apparatus. Electronically Signed   By: Lupita RaiderJames  Green Jr, M.D.   On: 02/18/2016 07:24   Dg Chest Port 1 View  02/17/2016  CLINICAL DATA:  Ventilator dependence. EXAM: PORTABLE CHEST 1 VIEW COMPARISON:  Earlier the same day. FINDINGS: 1822 hours. Endotracheal tube tip is 7.4 cm above the base of the carina. Right IJ pulmonary artery catheter tip is in the interlobar pulmonary artery. The tip of the intra-aortic balloon pump projects over the inferior aspect of the transverse aorta. The NG tube passes into the stomach although the distal tip position is not included on the film. Proximal port of the NG tube is at or just above the EG junction. Diffuse interstitial and central alveolar opacities suggest edema. Telemetry leads overlie the chest. IMPRESSION: The tip of the intra-aortic balloon pump projects over the inferior aspect of the transverse aorta. Electronically Signed   By: Kennith CenterEric  Mansell M.D.   On: 02/17/2016 18:34   Dg Chest Port 1 View  02/17/2016  CLINICAL DATA:  50 year old male with a history of Swan-Ganz catheter placement. EXAM: PORTABLE CHEST 1 VIEW COMPARISON:  02/17/2016 FINDINGS: Cardiomediastinal silhouette unchanged in size and contour. Skull pads again project over the thorax. Unchanged endotracheal tube, gastric tube. Interval  placement of right IJ sheath, through which a Swan-Ganz catheter has been placed terminating in the right pulmonary arteries. Persisting bilateral airspace opacity IMPRESSION: Interval placement of right IJ sheath, through which a Swan-Ganz catheter has been placed and terminates in the right pulmonary arteries at the hilum. Similar appearance of bilateral airspace opacities, potentially infection and/or edema. Unchanged support apparatus as above. Signed, Yvone NeuJaime S. Loreta AveWagner, DO [facility] Electronically Signed   By: Gilmer MorJaime  Wagner D.O.   On: 02/17/2016 13:53   Dg Chest Port 1 View  02/17/2016  CLINICAL DATA:  Hypoxia EXAM: PORTABLE CHEST 1 VIEW COMPARISON:  February 16, 2016 FINDINGS: Endotracheal tube tip is 3.5 cm above the carina. Nasogastric tube tip and side port are in the stomach. No pneumothorax evident. There is interstitial edema throughout the lungs bilaterally, perhaps slightly increased from 1 day prior. No airspace consolidation. Heart is upper normal in size with pulmonary vascularity within normal limits. No adenopathy. IMPRESSION: Tube positions as described without pneumothorax. Slight increase in interstitial edema. No airspace consolidation. No change in cardiac silhouette. Electronically Signed   By: Bretta Bang III M.D.   On: 02/17/2016 07:31   Dg Chest Portable 1 View  02/16/2016  CLINICAL DATA:  Endotracheal tube placement EXAM: PORTABLE CHEST 1 VIEW COMPARISON:  Chest x-ray from earlier same day. FINDINGS: Endotracheal tube well positioned with tip approximately 2 cm above the carina. Enteric tube passes below the diaphragm. Heart size is normal. There is central pulmonary vascular congestion and mild interstitial edema. Suspect small left pleural effusion. No pneumothorax. Osseous structures about the chest are unremarkable. IMPRESSION: 1. Endotracheal tube well positioned with tip approximately 2 cm above the carina. 2.  Central pulmonary vascular congestion and mild bilateral interstitial edema suggesting mild volume overload/resuscitation efforts. No frank alveolar pulmonary edema. Electronically Signed   By: Bary Richard M.D.   On: 02/16/2016 19:38   Dg Chest Port 1 View  02/16/2016  CLINICAL DATA:  Altered mental status.  Depression. EXAM: PORTABLE CHEST 1 VIEW COMPARISON:  02/13/2016. FINDINGS: Midline trachea. Normal heart size and mediastinal contours. Left costophrenic angle exclusion. No pleural effusion or pneumothorax. Clear lungs. IMPRESSION: No active disease. Electronically Signed   By: Jeronimo Greaves M.D.   On: 02/16/2016 13:48   Ct Portable Head W/o Cm  02/17/2016  CLINICAL DATA:  50 year old male with a history of hydrocephalus. Acute meningitis. EXAM: CT HEAD WITHOUT CONTRAST TECHNIQUE: Contiguous axial images were obtained from the base of the skull through the vertex without intravenous contrast. COMPARISON:  02/16/2016 FINDINGS: Unremarkable appearance of the calvarium without acute fracture or aggressive lesion. Unremarkable appearance of the scalp soft tissues. Unremarkable appearance of the bilateral orbits. Partially visualized endotracheal tube.  Oral enteric tube. Near complete opacification of the left maxillary sinus with frothy secretions. Trace secretions of the right maxillary sinus. Mucoperiosteal thickening of the ethmoid air cells and the sphenoid sinuses. Trace disease of the frontal sinuses. Mastoid air cells are clear. Layered fluid within the nasopharynx. Configuration the ventricles is similar to comparison with bifrontal diameter measuring 40 mm, unchanged from prior. Unchanged appearance of the hemispheric sulci. No acute intracranial hemorrhage.  No midline shift. Gray-white differentiation relatively maintained. IMPRESSION: Unchanged ventricular dilation and appearance of the cerebral sulci, with no evidence of acute hemorrhage or infarction. Signed, Yvone Neu. Loreta Ave, DO [facility] Electronically Signed   By: Gilmer Mor D.O.   On: 02/17/2016 07:23    PHYSICAL EXAM General: Intubated/sedated.  Neck: JVP not elevated, no thyromegaly or thyroid nodule.  Lungs: Crackles dependently bilaterally.  CV: Nondisplaced PMI.  Heart regular S1/S2, no +S3, no murmur.  No peripheral edema.  Abdomen: Soft, no hepatosplenomegaly, no distention.  Neurologic: Intubated/sedated.  Psych: Normal affect. Extremities: No clubbing or cyanosis. IABP in place, pedal pulses can be dopplered.   TELEMETRY: Reviewed telemetry pt in sinus tachy  ASSESSMENT AND PLAN: 50 yo with minimal past medical history was admitted with confusion, prior URI symptoms.  Had LP concerning for meningitis and found to have strep bacteremia/meningitis.  Later developed shock requiring pressors and was intubated  for airway protection.  Bedside echo showed EF about 10%.  Swan placed, noted cardiogenic shock and had IABP.   1. Shock: Primarily appears to have cardiogenic shock.  Cardiac output much improved today, we are weaning pressors and inotropes.  Still has IABP. - Decrease dobutamine to 2.5, continue milrinone for now. - Continue to wean norepinephrine, now down to 5. - Continue IABP, hgb/plts ok.  He remains on heparin for IABP.  When he is off dobutamine and norepinephrine, will begin wean of IABP.  Hopefully out tomorrow.  2. Acute hypoxemic respiratory failure: CXR looked like pulmonary edema.  Oxygen requirement has decreased with diuresis.  Repeat CXR today. 3. ID: Strep bacteremia with meningitis: On vancomycin and ceftriaxone. WBCs remain high. 4. AKI: Creatinine 1.8 but stable and making urine.  5. Acute systolic CHF: EF 21-30% on 3/16 echo.  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  Cardiac output much improved today.  He diuresed well yesterday with IV Lasix, PCWP 10 this morning.  - Hold Lasix. - Wean inotropes/pressors as above. Hopefully can get  IABP out by tomorrow.  6. Elevated LFTs: Suspect shock liver. LFTs improving.  7. Cardiac arrest: PEA arrest then ventricular fibrillation in ER.  Now on amiodarone in NSR.   35 min critical care time.   Marca Ancona 02/19/2016 7:35 AM

## 2016-02-19 NOTE — Progress Notes (Signed)
PULMONARY / CRITICAL CARE MEDICINE   Name: Dale [name] MRN: [id number] DOB: [birth year]    ADMISSION DATE:  02/16/2016   CONSULTATION DATE:  02/16/16  REFERRING MD:  Rito Ehrlich  CHIEF COMPLAINT:  AMS   STUDIES:  3/14 CT head > multifocal sinusitis, enlarged ventricles 3/14 LP >> glucose < 20, protein > 600, WBC 340 (88%N), 89 RBC  CULTURES: 3/14 Blood > strep pneumo 3/14 Urine > 3/14 CSF > strep pneumo   ANTIBIOTICS: 3/14 Ampicillin >> 3/14 3/14 Vancomycin >> 3/14 Rocephin >>  LINES/TUBES: 3/14 ETT >> 3/15 Rt femoral CVL >> 3/15 Rt femoral aline >> 3/15 Rt IJ introducer >>     SIGNIFICANT EVENTS: 3/14 Admit, VDRF, ID/cardiology/neurology consulted 3/15 Cardiac arrest, 5 mins, Asystole > VF > ST; ARDS, septic/cardiogenic shock >> IABP; start paralytic 3/16 - Remains on paralytic, pressors, inotropes.   SUBJECTIVE/OVERNIGHT/INTERVAL HX 3/17 - on fent 200 +versed 3 + nimbex 3 + amio gtt + heparingtt + milrinone gt + dobutamine gtt + levophed gtt + IABP . BIS 30s  VITAL SIGNS: BP 99/51 mmHg  Pulse 98  Temp(Src) 99 F (37.2 C) (Core (Comment))  Resp 20  Ht 5' 8.9" (1.75 m)  Wt 67.7 kg (149 lb 4 oz)  BMI 22.11 kg/m2  SpO2 96%  HEMODYNAMICS: PAP: (25-41)/(13-30) 37/20 mmHg CVP:  [6 mmHg-15 mmHg] 6 mmHg PCWP:  [10 mmHg-14 mmHg] 13 mmHg CO:  [4.7 L/min-6 L/min] 5.8 L/min CI:  [2.6 L/min/m2-3.3 L/min/m2] 3.1 L/min/m2  VENTILATOR SETTINGS: Vent Mode:  [-] PRVC FiO2 (%):  [40 %-50 %] 40 % Set Rate:  [20 bmp-30 bmp] 20 bmp Vt Set:  [410 mL] 410 mL PEEP:  [5 cmH20-8 cmH20] 5 cmH20 Plateau Pressure:  [13 cmH20-18 cmH20] 13 cmH20  INTAKE / OUTPUT: I/O last 3 completed shifts: In: 6259.7 [I.V.:4803.4; NG/GT:656.3; IV Piggyback:800] Out: 6350 [Urine:6350]  PHYSICAL EXAMINATION: General: paralyzed Neuro: RASS -5 HEENT: ETT in place Cardiac: regular Chest: b/l crackles Abd: soft Ext: cool Skin: no rashes  LABS:  PULMONARY  Recent Labs Lab  02/17/16 1533  02/17/16 2146  02/18/16 1004 02/18/16 1022 02/18/16 1213 02/19/16 0400 02/19/16 0425  PHART 7.238*  --  7.483*  --  7.493*  --  7.481* 7.553*  --   PCO2ART 31.6*  --  21.6*  --  29.8*  --  31.5* 28.6*  --   PO2ART 55.0*  --  96.0  --  57.0*  --  63.0* 101*  --   HCO3 13.5*  --  16.7*  --  23.2  --  23.9 25.4*  --   TCO2 14  --  17  --  24  --  25 26.3  --   O2SAT 83.0  < > 99.0  < > 94.0 64.7 95.0 97.8 75.4  < > = values in this interval not displayed.  CBC  Recent Labs Lab 02/17/16 0500 02/18/16 0500 02/19/16 0400  HGB 12.0* 11.0* 10.2*  HCT 33.6* 30.4* 29.2*  WBC 19.7* 22.1* 21.5*  PLT 136* 144* 161    COAGULATION  Recent Labs Lab 02/17/16 0053  INR 1.14    CARDIAC   Recent Labs Lab 02/13/16 1753 02/16/16 2016 02/17/16 0033 02/17/16 0500 02/17/16 0845  TROPONINI <0.03 7.59* 13.02* 6.43* 4.74*   No results for input(s): PROBNP in the last 168 hours.   CHEMISTRY  Recent Labs Lab 02/17/16 0033 02/17/16 0500 02/17/16 1903 02/18/16 0500 02/18/16 1550 02/19/16 0400  NA 141 134* 137 134* 138 142  K  3.0* 4.4 3.7 3.1* 3.6 3.4*  CL 106 106 102 102 101 105  CO2 17* 14* 20* 18* 22 25  GLUCOSE 145* 309* 248* 208* 148* 116*  BUN 16 22* 28* 28* 28* 30*  CREATININE 1.86* 1.65* 1.82* 1.83* 1.79* 1.86*  CALCIUM 6.5* 6.5* 6.2* 6.6* 6.8* 7.0*  MG 2.2 2.1 2.0 2.0  --  2.0  PHOS 4.6 3.4 2.7 1.7*  --  1.7*   Estimated Creatinine Clearance: 45 mL/min (by C-G formula based on Cr of 1.86).   LIVER  Recent Labs Lab 02/16/16 1200 02/17/16 0033 02/17/16 0053 02/18/16 0500 02/19/16 0400  AST 117* 174*  --  515* 142*  ALT 42 45  --  100* 83*  ALKPHOS 56 39  --  42 62  BILITOT 1.1 1.7*  --  0.3 0.3  PROT 7.5 5.0*  --  4.9* 5.0*  ALBUMIN 2.9* 1.7*  --  1.5* 1.5*  INR  --   --  1.14  --   --      INFECTIOUS  Recent Labs Lab 02/16/16 1717 02/16/16 2006 02/17/16 0030 02/17/16 0053  LATICACIDVEN 2.11* 1.5 4.9*  --   PROCALCITON  --    --   --  73.53     ENDOCRINE CBG (last 3)   Recent Labs  02/19/16 0035 02/19/16 0357 02/19/16 0756  GLUCAP 127* 118* 124*         IMAGING x48h  - image(s) personally visualized  -   highlighted in bold Dg Chest Port 1 View  02/19/2016  CLINICAL DATA:  CHF EXAM: PORTABLE CHEST 1 VIEW COMPARISON:  02/18/2016 FINDINGS: Support devices remain in place, unchanged. Heart is normal size. Diffuse interstitial and alveolar opacities are again noted, likely edema. This is somewhat more confluent in both lung bases, left greater than right. Cannot completely exclude infiltrate at the left base. No visible effusions. No acute bony abnormality. IMPRESSION: Continued diffuse interstitial and alveolar opacities, likely edema. Somewhat more focal opacities in the lung bases, left greater than right. Cannot exclude pneumonia. Electronically Signed   By: Charlett Nose M.D.   On: 02/19/2016 08:29   Dg Chest Port 1 View  02/18/2016  CLINICAL DATA:  Entry aortic balloon pump asist. EXAM: PORTABLE CHEST 1 VIEW COMPARISON:  February 17, 2016. FINDINGS: The heart size and mediastinal contours are within normal limits. No pneumothorax or pleural effusion is noted. Endotracheal nasogastric tubes are unchanged in position. Right internal jugular Swan-Ganz catheter is unchanged with distal tip in expected position of right pulmonary artery. Distal tip of aortic balloon catheter is seen over expected position of superior portion of descending thoracic aorta just below aortic knob. Stable bilateral perihilar opacities are noted concerning for edema or inflammation. The visualized skeletal structures are unremarkable. IMPRESSION: Distal tip of aortic balloon catheter appears to be slightly more inferior in position compared to prior exam, just below aortic knob currently. This projects over the expected position of the superior aspect of descending thoracic aorta. Stable bilateral perihilar opacities are noted concerning for  edema or inflammation. Otherwise stable support apparatus. Electronically Signed   By: Lupita Raider, M.D.   On: 02/18/2016 07:24   Dg Chest Port 1 View  02/17/2016  CLINICAL DATA:  Ventilator dependence. EXAM: PORTABLE CHEST 1 VIEW COMPARISON:  Earlier the same day. FINDINGS: 1822 hours. Endotracheal tube tip is 7.4 cm above the base of the carina. Right IJ pulmonary artery catheter tip is in the interlobar pulmonary artery. The tip of  the intra-aortic balloon pump projects over the inferior aspect of the transverse aorta. The NG tube passes into the stomach although the distal tip position is not included on the film. Proximal port of the NG tube is at or just above the EG junction. Diffuse interstitial and central alveolar opacities suggest edema. Telemetry leads overlie the chest. IMPRESSION: The tip of the intra-aortic balloon pump projects over the inferior aspect of the transverse aorta. Electronically Signed   By: Kennith CenterEric  Mansell M.D.   On: 02/17/2016 18:34   Dg Chest Port 1 View  02/17/2016  CLINICAL DATA:  50 year old male with a history of Swan-Ganz catheter placement. EXAM: PORTABLE CHEST 1 VIEW COMPARISON:  02/17/2016 FINDINGS: Cardiomediastinal silhouette unchanged in size and contour. Skull pads again project over the thorax. Unchanged endotracheal tube, gastric tube. Interval placement of right IJ sheath, through which a Swan-Ganz catheter has been placed terminating in the right pulmonary arteries. Persisting bilateral airspace opacity IMPRESSION: Interval placement of right IJ sheath, through which a Swan-Ganz catheter has been placed and terminates in the right pulmonary arteries at the hilum. Similar appearance of bilateral airspace opacities, potentially infection and/or edema. Unchanged support apparatus as above. Signed, Yvone NeuJaime S. Loreta AveWagner, DO [facility] Electronically Signed   By: Gilmer MorJaime  Wagner D.O.   On: 02/17/2016 13:53       50 yo homeless male smoker with hx of ETOH developed upper respiratory symptoms.  Developed progressive confusion, lactic acidosis, AKI. 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  Suffered cardiac arrest, cardiogenic shock with acute systolic CHF, ARDS 3/15.  ASSESSMENT / PLAN:  PULMONARY A:  Acute respiratory failure 2nd to ARDS, acute pulmonary edema.    - does not meet SBT criteria  P:   Wean PEEP, FiO2 per ARDS protocol Goal 6 cc/kg for Vt F/u CXR, ABG  CARDIOVASCULAR A:  Septic/cardiogenic shock >> mostly cardiogenic now. Acute systolic CHF. Cardiac arrest 3/15. NSTEMI.   -ongoing cardiogenic shock P:  Follow up Echo Lasix, lopressor, IABP, amiodarone, dobutamine, milrinone, heparin gtt per cardiology If no improvement, might need ECMO  RENAL A:   AKI 2nd to ATN. Lactic acidosis. Hypokalemia. - repleted Hypophos +  P:   repelte Na Phos 300mil ASA, lopressor, lasix, amiodarone gtt, heparin gtt Continue HCO3 gtt for now >> f/u ABG, BMET  GASTROINTESTINAL A:   Nutrition. Shock liver. P:   Tube feeds Protonix F/u LFTs  HEMATOLOGIC A:   Leukocytosis. P:  F/u CBC  INFECTIOUS A:   Streptococcal bacteremia and meningitis. P:   Day 4 vancomycin, ceftriaxone, decadron per ID  ENDOCRINE A:   Hyperglycemia. Relative adrenal insufficiency >> cortisol 15.2 from 3/15. P:   Insulin gtt Decadron  NEUROLOGIC A:   Acute metabolic encephalopathy. Hx of ETOH, THC. P:   Thiamine, folic acid RASS -5 while on paralytic   AUTOIMMUNE A - no prior hx of autoimmune diz P - check anca, ana, ds-dna  FAMILY - no family at bedside 02/19/2016      The patient is critically ill with multiple organ systems failure and requires high complexity decision making for assessment and support, frequent evaluation and titration of therapies, application of advanced monitoring technologies and extensive interpretation of multiple databases.   Critical Care Time  devoted to patient care services described in this note is  30  Minutes. This time reflects time of care of this signee Dr Kalman ShanMurali Larita Deremer. This critical care time does not reflect procedure time, or teaching time or supervisory  time of PA/NP/Med student/Med Resident etc but could involve care discussion time    Dr. Kalman Shan, M.D., [facility].C.P Pulmonary and Critical Care Medicine Staff Physician [facility]  Pulmonary and Critical Care Pager: [phone], If no answer or between  15:00h - 7:00h: call [phone]  02/19/2016 11:17 AM

## 2016-02-19 NOTE — Progress Notes (Signed)
[facility]   Reason for visit: Follow up on Strep pneumononia  Interval History: remains intubated, on balloon pump, Strep pneumonia sensitivities noted, dexamethasone for 4 days, stop date in.      Physical Exam: Constitutional:  Filed Vitals:   02/19/16 1300 02/19/16 1400  BP: 94/56 97/64  Pulse: 102 99  Temp: 98.6 F (37 C) 98.1 F (36.7 C)  Resp: 20 20   intubated, sedated Eyes: anicteric, pinpoint pupils HENT: intubated Respiratory: Vented Cardiovascular: RRR GI: soft, nt, nd  Review of Systems: Unable to be assessed due to mental status  Lab Results  Component Value Date   WBC 21.5* 02/19/2016   HGB 10.2* 02/19/2016   HCT 29.2* 02/19/2016   MCV 85.4 02/19/2016   PLT 161 02/19/2016    Lab Results  Component Value Date   CREATININE 1.86* 02/19/2016   BUN 30* 02/19/2016   NA 142 02/19/2016   K 3.4* 02/19/2016   CL 105 02/19/2016   CO2 25 02/19/2016    Lab Results  Component Value Date   ALT 83* 02/19/2016   AST 142* 02/19/2016   ALKPHOS 62 02/19/2016     Microbiology: Recent Results (from the past 240 hour(s))  Urine culture     Status: None   Collection Time: 02/16/16 11:40 AM  Result Value Ref Range Status   Specimen Description URINE, CATHETERIZED  Final   Special Requests NONE  Final   Culture NO GROWTH 1 DAY  Final   Report Status 02/17/2016 FINAL  Final  Blood Culture (routine x 2)     Status: None   Collection Time: 02/16/16 11:53 AM  Result Value Ref Range Status   Specimen Description BLOOD RIGHT ANTECUBITAL  Final   Special Requests BOTTLES DRAWN AEROBIC AND ANAEROBIC 5CC  Final   Culture  Setup Time   Final    GRAM POSITIVE COCCI IN CHAINS IN PAIRS IN BOTH AEROBIC AND ANAEROBIC BOTTLES CRITICAL RESULT CALLED TO, READ BACK BY AND VERIFIED WITH: B WOODSON,RN  02/17/16 MKELLY    Culture STREPTOCOCCUS PNEUMONIAE  Final   Report Status 02/19/2016 FINAL  Final   Organism ID, Bacteria STREPTOCOCCUS  PNEUMONIAE  Final      Susceptibility   Streptococcus pneumoniae - MIC*    ERYTHROMYCIN >=8 RESISTANT Resistant     LEVOFLOXACIN 0.5 SENSITIVE Sensitive     VANCOMYCIN 0.5 SENSITIVE Sensitive     PENICILLIN 0.25 SENSITIVE Sensitive     CEFTRIAXONE 0.25 SENSITIVE Sensitive     * STREPTOCOCCUS PNEUMONIAE  Blood Culture (routine x 2)     Status: None   Collection Time: 02/16/16 12:00 PM  Result Value Ref Range Status   Specimen Description BLOOD RIGHT HAND  Final   Special Requests BOTTLES DRAWN AEROBIC AND ANAEROBIC 5CC  Final   Culture  Setup Time   Final    GRAM POSITIVE COCCI IN CHAINS IN PAIRS IN BOTH AEROBIC AND ANAEROBIC BOTTLES CRITICAL RESULT CALLED TO, READ BACK BY AND VERIFIED WITH: B WOODSON,RN  02/17/16 MKELLY    Culture   Final    STREPTOCOCCUS PNEUMONIAE SUSCEPTIBILITIES PERFORMED ON PREVIOUS CULTURE WITHIN THE LAST 5 DAYS.    Report Status 02/19/2016 FINAL  Final  CSF culture with Stat gram stain     Status: None   Collection Time: 02/16/16  4:44 PM  Result Value Ref Range Status   Specimen Description CSF  Final   Special Requests NONE  Final   Gram Stain  Final    WBC PRESENT,BOTH PMN AND MONONUCLEAR GRAM POSITIVE COCCI IN PAIRS Gram Stain Report Called to,Read Back By and Verified With: N Zonia KiefSTEPHENS RN 1806 02/16/16 A BROWNING CYTOSPIN    Culture FEW STREPTOCOCCUS PNEUMONIAE  Final   Report Status 02/19/2016 FINAL  Final   Organism ID, Bacteria STREPTOCOCCUS PNEUMONIAE  Final      Susceptibility   Streptococcus pneumoniae - MIC*    ERYTHROMYCIN >=8 RESISTANT Resistant     LEVOFLOXACIN 0.5 SENSITIVE Sensitive     VANCOMYCIN 0.5 SENSITIVE Sensitive     PENICILLIN 0.25 RESISTANT Resistant     CEFTRIAXONE 0.25 SENSITIVE Sensitive     * FEW STREPTOCOCCUS PNEUMONIAE  MRSA PCR Screening     Status: None   Collection Time: 02/16/16 11:11 PM  Result Value Ref Range Status   MRSA by PCR NEGATIVE NEGATIVE Final    Comment:        The GeneXpert MRSA Assay  (FDA approved for NASAL specimens only), is one component of a comprehensive MRSA colonization surveillance program. It is not intended to diagnose MRSA infection nor to guide or monitor treatment for MRSA infections.   Culture, blood (x 2)     Status: None (Preliminary result)   Collection Time: 02/17/16  1:00 AM  Result Value Ref Range Status   Specimen Description BLOOD RIGHT ANTECUBITAL  Final   Special Requests   Final    BOTTLES DRAWN AEROBIC AND ANAEROBIC 4CC BLUE,5CC RED   Culture NO GROWTH 1 DAY  Final   Report Status PENDING  Incomplete  Culture, blood (x 2)     Status: None (Preliminary result)   Collection Time: 02/17/16  1:00 AM  Result Value Ref Range Status   Specimen Description BLOOD A-LINE  Final   Special Requests BOTTLES DRAWN AEROBIC AND ANAEROBIC 5CC   Final   Culture NO GROWTH 1 DAY  Final   Report Status PENDING  Incomplete    Impression/Plan:  1. Strep pneumonia meningitis - stopped vancomycin due to sensitivities.  Dexa for 4 days and stop date in.      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 Drue SecondSnider is available over the weekend if needed, otherwise I will follow up on Monday.

## 2016-02-19 NOTE — Progress Notes (Signed)
eLink Physician-Brief Progress Note Patient Name: Dale DemarkGraig D Netter DOB: 05/22/1966 MRN: [id number]   Date of Service  02/19/2016  HPI/Events of Note  Hypokalemia and hypophosphatemia  eICU Interventions  Potassium and phos replaced     Intervention Category Intermediate Interventions: Electrolyte abnormality - evaluation and management  DETERDING,ELIZABETH 02/19/2016, 6:24 AM

## 2016-02-19 NOTE — Progress Notes (Signed)
CSW consult re: homelessness issues acknowledged. Patient is currently intubated and unable to be assessed. CSW will continue to follow for homelessness/substance abuse resources.    Noe GensAshley Gardner, LCSW [facility] Clinical Social Worker  [phone]

## 2016-02-19 NOTE — Progress Notes (Signed)
ANTICOAGULATION CONSULT NOTE - Follow Up Consult  Pharmacy Consult for Heparin  Indication: IABP  No Known Allergies  Patient Measurements: Height: 5' 8.9" (175 cm) Weight: 149 lb 4 oz (67.7 kg) IBW/kg (Calculated) : 70.47  Vital Signs: Temp: 98.6 F (37 C) (03/17 1300) Temp Source: Core (Comment) (03/17 1200) BP: 94/56 mmHg (03/17 1300) Pulse Rate: 102 (03/17 1300)  Labs:  Recent Labs  02/17/16 0033  02/17/16 0053 02/17/16 0500 02/17/16 0845  02/18/16 0500  02/18/16 1550 02/18/16 2015 02/19/16 0400 02/19/16 1143  HGB  --   < >  --  12.0*  --   --  11.0*  --   --   --  10.2*  --   HCT  --   < >  --  33.6*  --   --  30.4*  --   --   --  29.2*  --   PLT  --   < >  --  136*  --   --  144*  --   --   --  161  --   APTT  --   --  49*  --   --   --   --   --   --   --   --   --   LABPROT  --   --  14.8  --   --   --   --   --   --   --   --   --   INR  --   --  1.14  --   --   --   --   --   --   --   --   --   HEPARINUNFRC  --   --   --   --  <0.10*  < >  --   < >  --  0.32 0.19* 0.26*  CREATININE 1.86*  --   --  1.65*  --   < > 1.83*  --  1.79*  --  1.86*  --   CKTOTAL 645*  --   --   --  540*  --   --   --   --   --   --   --   TROPONINI 13.02*  --   --  6.43* 4.74*  --   --   --   --   --   --   --   < > = values in this interval not displayed.  Estimated Creatinine Clearance: 45 mL/min (by C-G formula based on Cr of 1.86).   . sodium chloride 250 mL (02/19/16 1200)  . amiodarone 30 mg/hr (02/19/16 0944)  . cisatracurium (NIMBEX) infusion 3 mcg/kg/min (02/19/16 0800)  . DOBUTamine 5 mcg/kg/min (02/19/16 1200)  . feeding supplement (VITAL AF 1.2 CAL) 1,000 mL (02/19/16 0615)  . fentaNYL infusion INTRAVENOUS 200 mcg/hr (02/19/16 0800)  . heparin 1,100 Units/hr (02/19/16 0800)  . insulin (NOVOLIN-R) infusion Stopped (02/18/16 1700)  . midazolam (VERSED) infusion 3 mg/hr (02/19/16 0800)  . milrinone 0.125 mcg/kg/min (02/19/16 1130)  . norepinephrine (LEVOPHED) Adult  infusion Stopped (02/19/16 1257)     Assessment: 50 yo male on IABP for septic/cardiogenic shock.  Heparin currently at goal level on heparin at 1100 units/hr.  No bleeding or complications noted, platelet stable.   Goal of Therapy:  Heparin level 0.2-0.5 units/ml Monitor platelets by anticoagulation protocol: Yes   Plan:  -Continue IV heparin at current rate. -F/u plans to hold heparin  for IABP pull eventually. -Daily heparin level and CBC -Monitor for bleeding  Tad MooreJessica Cataleia Gade, Pharm D, BCPS  Clinical Pharmacist Pager [phone]  02/19/2016 2:01 PM

## 2016-02-19 NOTE — Progress Notes (Signed)
ANTICOAGULATION CONSULT NOTE - Follow Up Consult  Pharmacy Consult for Heparin  Indication: IABP  No Known Allergies  Patient Measurements: Height: 5' 8.9" (175 cm) Weight: 153 lb 3.5 oz (69.5 kg) IBW/kg (Calculated) : 70.47  Vital Signs: Temp: 98.2 F (36.8 C) (03/17 0400) Temp Source: Core (Comment) (03/17 0400) BP: 105/60 mmHg (03/17 0400) Pulse Rate: 96 (03/17 0400)  Labs:  Recent Labs  02/17/16 0033  02/17/16 0053 02/17/16 0500 02/17/16 0845 02/17/16 1903  02/18/16 0500 02/18/16 1015 02/18/16 1550 02/18/16 2015 02/19/16 0400  HGB  --   < >  --  12.0*  --   --   --  11.0*  --   --   --  10.2*  HCT  --   < >  --  33.6*  --   --   --  30.4*  --   --   --  29.2*  PLT  --   < >  --  136*  --   --   --  144*  --   --   --  161  APTT  --   --  49*  --   --   --   --   --   --   --   --   --   LABPROT  --   --  14.8  --   --   --   --   --   --   --   --   --   INR  --   --  1.14  --   --   --   --   --   --   --   --   --   HEPARINUNFRC  --   --   --   --  <0.10*  --   < >  --  0.15*  --  0.32 0.19*  CREATININE 1.86*  --   --  1.65*  --  1.82*  --  1.83*  --  1.79*  --   --   CKTOTAL 645*  --   --   --  540*  --   --   --   --   --   --   --   TROPONINI 13.02*  --   --  6.43* 4.74*  --   --   --   --   --   --   --   < > = values in this interval not displayed.  Estimated Creatinine Clearance: 48 mL/min (by C-G formula based on Cr of 1.79).  Assessment: IABP in place, HL is just below therapeutic range   Goal of Therapy:  Heparin level 0.2-0.5 units/ml Monitor platelets by anticoagulation protocol: Yes   Plan:  -Increase heparin to 1100 units/hr -1200 HL -Monitor for bleeding  Abran DukeLedford, Noele Icenhour 02/19/2016,5:19 AM

## 2016-02-20 ENCOUNTER — Inpatient Hospital Stay (HOSPITAL_COMMUNITY): Payer: Medicaid Other

## 2016-02-20 LAB — COMPREHENSIVE METABOLIC PANEL
ALK PHOS: 45 U/L (ref 38–126)
ALT: 79 U/L — AB (ref 17–63)
AST: 86 U/L — ABNORMAL HIGH (ref 15–41)
Albumin: 1.6 g/dL — ABNORMAL LOW (ref 3.5–5.0)
Anion gap: 11 (ref 5–15)
BILIRUBIN TOTAL: 0.4 mg/dL (ref 0.3–1.2)
BUN: 37 mg/dL — AB (ref 6–20)
CALCIUM: 7.1 mg/dL — AB (ref 8.9–10.3)
CO2: 27 mmol/L (ref 22–32)
CREATININE: 1.59 mg/dL — AB (ref 0.61–1.24)
Chloride: 109 mmol/L (ref 101–111)
GFR, EST AFRICAN AMERICAN: 56 mL/min — AB (ref >60)
GFR, EST NON AFRICAN AMERICAN: 49 mL/min — AB (ref >60)
Glucose, Bld: 131 mg/dL — ABNORMAL HIGH (ref 65–99)
Potassium: 3.8 mmol/L (ref 3.5–5.1)
Sodium: 147 mmol/L — ABNORMAL HIGH (ref 135–145)
TOTAL PROTEIN: 5.1 g/dL — AB (ref 6.5–8.1)

## 2016-02-20 LAB — BLOOD GAS, ARTERIAL
Acid-Base Excess: 4.5 mmol/L — ABNORMAL HIGH (ref 0.0–2.0)
Bicarbonate: 28.8 mEq/L — ABNORMAL HIGH (ref 20.0–24.0)
DRAWN BY: 441351
FIO2: 0.4
MECHVT: 410 mL
O2 SAT: 94.8 %
PEEP/CPAP: 5 cmH2O
PH ART: 7.42 (ref 7.350–7.450)
PO2 ART: 84.7 mmHg (ref 80.0–100.0)
Patient temperature: 98.6
RATE: 20 resp/min
TCO2: 30.2 mmol/L (ref 0–100)
pCO2 arterial: 45.2 mmHg — ABNORMAL HIGH (ref 35.0–45.0)

## 2016-02-20 LAB — PHOSPHORUS: Phosphorus: 2.9 mg/dL (ref 2.5–4.6)

## 2016-02-20 LAB — CBC
HCT: 27.7 % — ABNORMAL LOW (ref 39.0–52.0)
HEMOGLOBIN: 9.6 g/dL — AB (ref 13.0–17.0)
MCH: 30.6 pg (ref 26.0–34.0)
MCHC: 34.7 g/dL (ref 30.0–36.0)
MCV: 88.2 fL (ref 78.0–100.0)
Platelets: 168 10*3/uL (ref 150–400)
RBC: 3.14 MIL/uL — ABNORMAL LOW (ref 4.22–5.81)
RDW: 14.1 % (ref 11.5–15.5)
WBC: 14.4 10*3/uL — ABNORMAL HIGH (ref 4.0–10.5)

## 2016-02-20 LAB — GLUCOSE, CAPILLARY
GLUCOSE-CAPILLARY: 119 mg/dL — AB (ref 65–99)
GLUCOSE-CAPILLARY: 133 mg/dL — AB (ref 65–99)
GLUCOSE-CAPILLARY: 143 mg/dL — AB (ref 65–99)
Glucose-Capillary: 144 mg/dL — ABNORMAL HIGH (ref 65–99)
Glucose-Capillary: 144 mg/dL — ABNORMAL HIGH (ref 65–99)

## 2016-02-20 LAB — HEPARIN LEVEL (UNFRACTIONATED): HEPARIN UNFRACTIONATED: 0.4 [IU]/mL (ref 0.30–0.70)

## 2016-02-20 LAB — CARBOXYHEMOGLOBIN
Carboxyhemoglobin: 0.6 % (ref 0.5–1.5)
Methemoglobin: 0.9 % (ref 0.0–1.5)
O2 SAT: 73 %
TOTAL HEMOGLOBIN: 13.4 g/dL — AB (ref 13.5–18.0)

## 2016-02-20 LAB — POCT ACTIVATED CLOTTING TIME
ACTIVATED CLOTTING TIME: 167 s
ACTIVATED CLOTTING TIME: 162 s
ACTIVATED CLOTTING TIME: 121 s
Activated Clotting Time: 167 seconds

## 2016-02-20 LAB — MAGNESIUM: Magnesium: 2.3 mg/dL (ref 1.7–2.4)

## 2016-02-20 LAB — MPO/PR-3 (ANCA) ANTIBODIES: Myeloperoxidase Abs: <9 U/mL (ref 0.0–9.0)

## 2016-02-20 LAB — LACTIC ACID, PLASMA: LACTIC ACID, VENOUS: 1.1 mmol/L (ref 0.5–2.0)

## 2016-02-20 LAB — ANTI-DNA ANTIBODY, DOUBLE-STRANDED: ds DNA Ab: 1 IU/mL (ref 0–9)

## 2016-02-20 MED ORDER — SODIUM CHLORIDE 0.9 % IV SOLN
INTRAVENOUS | Status: DC
  Administered 2016-02-22: 14:00:00 via INTRAVENOUS

## 2016-02-20 MED ORDER — FUROSEMIDE 10 MG/ML IJ SOLN
80.0000 mg | Freq: Once | INTRAMUSCULAR | Status: AC
  Administered 2016-02-20: 80 mg via INTRAVENOUS
  Filled 2016-02-20: qty 8

## 2016-02-20 MED ORDER — SODIUM CHLORIDE 0.9 % IV SOLN
0.5000 ug/kg/min | INTRAVENOUS | Status: DC
  Administered 2016-02-20: 3 ug/kg/min via INTRAVENOUS
  Filled 2016-02-20 (×2): qty 20

## 2016-02-20 MED ORDER — ATROPINE SULFATE 0.1 MG/ML IJ SOLN
INTRAMUSCULAR | Status: AC
  Filled 2016-02-20: qty 10

## 2016-02-20 MED ORDER — ROCURONIUM BROMIDE 50 MG/5ML IV SOLN
60.0000 mg | INTRAVENOUS | Status: DC | PRN
  Filled 2016-02-20: qty 6

## 2016-02-20 MED ORDER — ASPIRIN 81 MG PO CHEW
81.0000 mg | CHEWABLE_TABLET | Freq: Every day | ORAL | Status: DC
  Administered 2016-02-20 – 2016-03-10 (×19): 81 mg via ORAL
  Filled 2016-02-20 (×20): qty 1

## 2016-02-20 MED ORDER — SPIRONOLACTONE 25 MG PO TABS
25.0000 mg | ORAL_TABLET | Freq: Every day | ORAL | Status: DC
  Administered 2016-02-20 – 2016-03-02 (×12): 25 mg
  Filled 2016-02-20 (×12): qty 1

## 2016-02-20 NOTE — Plan of Care (Signed)
Problem: Phase I Progression Outcomes Goal: HOB elevated 30 degrees Outcome: Not Progressing Pt on IABP, head of bed to remain less than 30 degrees.

## 2016-02-20 NOTE — Progress Notes (Signed)
eLink Physician-Brief Progress Note Patient Name: Dale [name] DOB: 01/03/1966 MRN: [id number]   Date of Service  02/20/2016  HPI/Events of Note  Nurse contacted regarding need for paralytic with balloon pump in place. Patient currently on fentanyl & percent infusions.  eICU Interventions  Verbal order given for reordering of paralytic until balloon pump can be removed safely.     Intervention Category Intermediate Interventions: Other:  Dale [name] 02/20/2016, 6:48 PM

## 2016-02-20 NOTE — Progress Notes (Signed)
Dr. Tarri GlennAzeem notified of cvp 14-15 since iabp set to 1:3.  Map remaining 90-91.  Orders received.  Will continue to monitor pt closely.

## 2016-02-20 NOTE — Progress Notes (Signed)
PULMONARY / CRITICAL CARE MEDICINE   Name: Dale [name] MRN: [id number] DOB: 08/25/1966    ADMISSION DATE:  02/16/2016   CONSULTATION DATE:  02/16/16  REFERRING MD:  Rito EhrlichKrishnan  CHIEF COMPLAINT:  AMS   STUDIES:  3/14 CT head > multifocal sinusitis, enlarged ventricles 3/14 LP >> glucose < 20, protein > 600, WBC 340 (88%N), 89 RBC  CULTURES: 3/14 Blood > strep pneumo 3/14 Urine >neg 3/14 CSF > strep pneumo   ANTIBIOTICS: 3/14 Ampicillin >> 3/14 3/14 Vancomycin >>off 3/14 Rocephin >>  LINES/TUBES: 3/14 ETT >> 3/15 Rt femoral CVL >> 3/15 Rt femoral aline >> 3/15 Rt IJ introducer >>     SIGNIFICANT EVENTS: 3/14 Admit, VDRF, ID/cardiology/neurology consulted 3/15 Cardiac arrest, 5 mins, Asystole > VF > ST; ARDS, septic/cardiogenic shock >> IABP; start paralytic 3/16 - Remains on paralytic, pressors, inotropes. 3/18 - IABP to be removed 3/18 stopping nimbex   SUBJECTIVE/OVERNIGHT/INTERVAL HX 3/18 off milrinone. Note plans for d/c IABP Stopping nimbex for trial off nmb   VITAL SIGNS: BP 145/69 mmHg  Pulse 89  Temp(Src) 98.8 F (37.1 C) (Core (Comment))  Resp 20  Ht 5' 8.9" (1.75 m)  Wt 149 lb 11.1 oz (67.9 kg)  BMI 22.17 kg/m2  SpO2 96%  HEMODYNAMICS: PAP: (29-46)/(13-29) 36/20 mmHg CVP:  [6 mmHg-12 mmHg] 8 mmHg PCWP:  [12 mmHg-13 mmHg] 13 mmHg CO:  [5.2 L/min-6.2 L/min] 6.2 L/min CI:  [2.8 L/min/m2-3.4 L/min/m2] 3.4 L/min/m2  VENTILATOR SETTINGS: Vent Mode:  [-] PRVC FiO2 (%):  [40 %] 40 % Set Rate:  [20 bmp] 20 bmp Vt Set:  [410 mL] 410 mL PEEP:  [5 cmH20] 5 cmH20 Plateau Pressure:  [14 cmH20-16 cmH20] 16 cmH20  INTAKE / OUTPUT: I/O last 3 completed shifts: In: 5131.8 [I.V.:3026.8; NG/GT:1295; IV Piggyback:810] Out: 5272 [Urine:5272]  PHYSICAL EXAMINATION: General: paralyzed Neuro: RASS -5 with nmb on board HEENT: ETT in place Cardiac: regular Chest: b/l crackles Abd: soft Ext: cool Skin: no rashes  LABS:  PULMONARY  Recent  Labs Lab 02/17/16 2146  02/18/16 1004  02/18/16 1213 02/19/16 0400 02/19/16 0425 02/20/16 0405 02/20/16 0425  PHART 7.483*  --  7.493*  --  7.481* 7.553*  --  7.420  --   PCO2ART 21.6*  --  29.8*  --  31.5* 28.6*  --  45.2*  --   PO2ART 96.0  --  57.0*  --  63.0* 101*  --  84.7  --   HCO3 16.7*  --  23.2  --  23.9 25.4*  --  28.8*  --   TCO2 17  --  24  --  25 26.3  --  30.2  --   O2SAT 99.0  < > 94.0  < > 95.0 97.8 75.4 94.8 73.0  < > = values in this interval not displayed.  CBC  Recent Labs Lab 02/18/16 0500 02/19/16 0400 02/20/16 0400  HGB 11.0* 10.2* 9.6*  HCT 30.4* 29.2* 27.7*  WBC 22.1* 21.5* 14.4*  PLT 144* 161 168    COAGULATION  Recent Labs Lab 02/17/16 0053  INR 1.14    CARDIAC    Recent Labs Lab 02/13/16 1753 02/16/16 2016 02/17/16 0033 02/17/16 0500 02/17/16 0845  TROPONINI <0.03 7.59* 13.02* 6.43* 4.74*   No results for input(s): PROBNP in the last 168 hours.   CHEMISTRY  Recent Labs Lab 02/17/16 0500 02/17/16 1903 02/18/16 0500 02/18/16 1550 02/19/16 0400 02/20/16 0400  NA 134* 137 134* 138 142 147*  K 4.4 3.7 3.1*  3.6 3.4* 3.8  CL 106 102 102 101 105 109  CO2 14* 20* 18* GLUCOSE 309* 248* 208* 148* 116* 131*  BUN 22* 28* 28* 28* 30* 37*  CREATININE 1.65* 1.82* 1.83* 1.79* 1.86* 1.59*  CALCIUM 6.5* 6.2* 6.6* 6.8* 7.0* 7.1*  MG 2.1 2.0 2.0  --  2.0 2.3  PHOS 3.4 2.7 1.7*  --  1.7* 2.9   Estimated Creatinine Clearance: 52.8 mL/min (by C-G formula based on Cr of 1.59).   LIVER  Recent Labs Lab 02/16/16 1200 02/17/16 0033 02/17/16 0053 02/18/16 0500 02/19/16 0400 02/20/16 0400  AST 117* 174*  --  515* 142* 86*  ALT 42 45  --  100* 83* 79*  ALKPHOS 56 39  --  42 62 45  BILITOT 1.1 1.7*  --  0.3 0.3 0.4  PROT 7.5 5.0*  --  4.9* 5.0* 5.1*  ALBUMIN 2.9* 1.7*  --  1.5* 1.5* 1.6*  INR  --   --  1.14  --   --   --      INFECTIOUS  Recent Labs Lab 02/16/16 1717 02/16/16 2006 02/17/16 0030  02/17/16 0053  LATICACIDVEN 2.11* 1.5 4.9*  --   PROCALCITON  --   --   --  73.53     ENDOCRINE CBG (last 3)   Recent Labs  02/20/16 0353 02/20/16 0831 02/20/16 1215  GLUCAP 119* 144* 133*         IMAGING x48h  - image(s) personally visualized  -   highlighted in bold Dg Chest Port 1 View  02/20/2016  CLINICAL DATA:  CHF EXAM: PORTABLE CHEST 1 VIEW COMPARISON:  Chest radiograph from one day prior. FINDINGS: Right internal jugular Swan-Ganz catheter terminates over the central right lower lung, unchanged. Endotracheal tube tip is 2.4 cm above the carina. Enteric tube enters the stomach with the tip not seen on this image. Intra-aortic balloon pump marker terminates over the proximal descending thoracic aorta at the T7-8 level. Stable cardiomediastinal silhouette with normal heart size. No pneumothorax. No pleural effusion. Hazy opacities throughout the bilateral parahilar lungs are not appreciably changed. Probable complete left lower lobe atelectasis, unchanged. IMPRESSION: 1. Right internal jugular Swan-Ganz catheter terminates over the central lower right lung, consider retracting 3-4 cm . 2. Otherwise well-positioned support structures as described. 3. Stable hazy opacities throughout the parahilar lungs, favor pulmonary edema. Electronically Signed   By: Delbert Phenix M.D.   On: 02/20/2016 09:34   Dg Chest Port 1 View  02/19/2016  CLINICAL DATA:  CHF EXAM: PORTABLE CHEST 1 VIEW COMPARISON:  02/18/2016 FINDINGS: Support devices remain in place, unchanged. Heart is normal size. Diffuse interstitial and alveolar opacities are again noted, likely edema. This is somewhat more confluent in both lung bases, left greater than right. Cannot completely exclude infiltrate at the left base. No visible effusions. No acute bony abnormality. IMPRESSION: Continued diffuse interstitial and alveolar opacities, likely edema. Somewhat more focal opacities in the lung bases, left greater than right. Cannot  exclude pneumonia. Electronically Signed   By: Charlett Nose M.D.   On: 02/19/2016 08:29      50 yo homeless male smoker with hx of ETOH developed upper respiratory symptoms.  Developed progressive confusion, lactic acidosis, AKI. 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  Suffered cardiac arrest, cardiogenic shock with acute systolic CHF, ARDS 3/15.  ASSESSMENT / PLAN:  PULMONARY A:  Acute respiratory failure 2nd to ARDS, acute pulmonary edema. P:   Wean PEEP, FiO2 per  ARDS protocol Goal 6 cc/kg for Vt F/u CXR, ABG  CARDIOVASCULAR A:  Septic/cardiogenic shock >> mostly cardiogenic now. Acute systolic CHF. Cardiac arrest 3/15. NSTEMI. P:  Lasix, lopressor,  amiodarone, dobutamine, heparin gtt per cardiology Note plans for d/c IABP on 3/18  RENAL Lab Results  Component Value Date   CREATININE 1.59* 02/20/2016   CREATININE 1.86* 02/19/2016   CREATININE 1.79* 02/18/2016    Recent Labs Lab 02/18/16 1550 02/19/16 0400 02/20/16 0400  K 3.6 3.4* 3.8   A:   AKI 2nd to ATN. Lactic acidosis. Hypokalemia. - repleted P:   Follow BMP Replace electrolytes as indicated  GASTROINTESTINAL A:   Nutrition. Shock liver. P:   Tube feeds Protonix F/u LFTs  HEMATOLOGIC A:   Leukocytosis. P:  F/u CBC  INFECTIOUS A:   Streptococcal bacteremia and meningitis. P:   Day 5 vancomycin, ceftriaxone, decadron per ID  ENDOCRINE CBG (last 3)   Recent Labs  02/20/16 0353 02/20/16 0831 02/20/16 1215  GLUCAP 119* 144* 133*   A:   Hyperglycemia. Relative adrenal insufficiency >> cortisol 15.2 from 3/15. P:   Insulin gtt Decadron  NEUROLOGIC A:   Acute metabolic encephalopathy. Hx of ETOH, THC.  P:   Thiamine, folic acid RASS -1 3/18 will stop  nmb drip. Use NMB prn for vent synch if needed restart nmb drip   AUTOIMMUNE A - no prior hx of autoimmune diz P - following for anca, ana, ds-dna results  FAMILY - no family at bedside 02/20/2016   [facility] Minor  ACNP [facility] P[facility] Pager [phone] till 3 pm If no answer page [phone] 02/20/2016, 4:02 PM   Attending Note:  I have examined patient, reviewed labs, studies and notes. I have discussed the case with S Minor, and I agree with the data and plans as amended above. Critically ill with Ards, combined septic and cardiogenic shock due to strep bacteremia and meningitis. On my eval hemodynamically improved. Current vent settings FiO2 0.40 + PEEP 5.  Note that IABP will be removed today. We will also attempt to d/c his paralytics, manage him with sedation. Independent critical care time is 35 minutes.   Levy Pupa, MD, PhD 02/20/2016, 4:22 PM [facility] [phone] or if no answer [phone]

## 2016-02-20 NOTE — Progress Notes (Signed)
Patient ID: Dale [name], male   DOB: 11/22/1966, 50 y.o.   MRN: [id number]    SUBJECTIVE: Hemodynamics good today, have been weaning inotropes and off norepinephrine.  IABP in place and augmenting appropriately.    FiO2 down to 0.4 with PEEP 5.   Swan: CVP 8 PA 38/21 PCWP 13 CI 3.4 Co-ox 73%  Scheduled Meds: . antiseptic oral rinse  7 mL Mouth Rinse 10 times per day  . artificial tears  1 application Both Eyes 3 times per day  . aspirin  81 mg Oral Daily  . cefTRIAXone (ROCEPHIN)  IV  2 g Intravenous Q12H  . chlorhexidine gluconate  15 mL Mouth Rinse BID  . dexamethasone  10 mg Intravenous Q6H  . feeding supplement (VITAL HIGH PROTEIN)  1,000 mL Per Tube Q24H  . folic acid  1 mg Per Tube Daily  . insulin aspart  2-6 Units Subcutaneous 6 times per day  . insulin aspart  3 Units Subcutaneous 6 times per day  . insulin glargine  20 Units Subcutaneous Q24H  . metoprolol  2.5 mg Intravenous 4 times per day  . pantoprazole sodium  40 mg Per Tube Q24H  . sodium chloride flush  10-40 mL Intracatheter Q12H  . spironolactone  25 mg Per Tube Daily  . thiamine  100 mg Per Tube Daily   Continuous Infusions: . sodium chloride 250 mL (02/20/16 0030)  . amiodarone 30 mg/hr (02/19/16 2023)  . cisatracurium (NIMBEX) infusion 3 mcg/kg/min (02/20/16 0854)  . DOBUTamine 2.5 mcg/kg/min (02/20/16 0100)  . feeding supplement (VITAL AF 1.2 CAL) 1,000 mL (02/20/16 0615)  . fentaNYL infusion INTRAVENOUS 200 mcg/hr (02/20/16 0630)  . heparin 1,100 Units/hr (02/20/16 0100)  . insulin (NOVOLIN-R) infusion Stopped (02/18/16 1700)  . midazolam (VERSED) infusion 2 mg/hr (02/20/16 0000)  . norepinephrine (LEVOPHED) Adult infusion Stopped (02/19/16 1257)   PRN Meds:.acetaminophen (TYLENOL) oral liquid 160 mg/5 mL, albuterol, fentaNYL, fentaNYL, fentaNYL (SUBLIMAZE) injection, midazolam, midazolam, midazolam, sodium chloride flush    Filed Vitals:   02/20/16 0815 02/20/16 0830 02/20/16 0845 02/20/16  0900  BP:      Pulse: 98 98 98 98  Temp: 99.7 F (37.6 C) 93.7 F (34.3 C) 99.9 F (37.7 C) 99.9 F (37.7 C)  TempSrc:      Resp: 20 20 20 20   Height:      Weight:      SpO2: 96% 95% 94% 95%    Intake/Output Summary (Last 24 hours) at 02/20/16 0946 Last data filed at 02/20/16 0700  Gross per 24 hour  Intake 2981.41 ml  Output   2642 ml  Net 339.41 ml    LABS: Basic Metabolic Panel:  Recent Labs  50/08/3802/17/17 0400 02/20/16 0400  NA 142 147*  K 3.4* 3.8  CL 105 109  CO2 25 27  GLUCOSE 116* 131*  BUN 30* 37*  CREATININE 1.86* 1.59*  CALCIUM 7.0* 7.1*  MG 2.0 2.3  PHOS 1.7* 2.9   Liver Function Tests:  Recent Labs  02/19/16 0400 02/20/16 0400  AST 142* 86*  ALT 83* 79*  ALKPHOS 62 45  BILITOT 0.3 0.4  PROT 5.0* 5.1*  ALBUMIN 1.5* 1.6*   No results for input(s): LIPASE, AMYLASE in Dale last 72 hours. CBC:  Recent Labs  02/18/16 0500 02/19/16 0400 02/20/16 0400  WBC 22.1* 21.5* 14.4*  NEUTROABS 20.0*  --   --   HGB 11.0* 10.2* 9.6*  HCT 30.4* 29.2* 27.7*  MCV 85.6 85.4 88.2  PLT  144* 161 168   Cardiac Enzymes: No results for input(s): CKTOTAL, CKMB, CKMBINDEX, TROPONINI in Dale last 72 hours. BNP: Invalid input(s): POCBNP D-Dimer: No results for input(s): DDIMER in Dale last 72 hours. Hemoglobin A1C: No results for input(s): HGBA1C in Dale last 72 hours. Fasting Lipid Panel: No results for input(s): CHOL, HDL, LDLCALC, TRIG, CHOLHDL, LDLDIRECT in Dale last 72 hours. Thyroid Function Tests: No results for input(s): TSH, T4TOTAL, T3FREE, THYROIDAB in Dale last 72 hours.  Invalid input(s): FREET3 Anemia Panel: No results for input(s): VITAMINB12, FOLATE, FERRITIN, TIBC, IRON, RETICCTPCT in Dale last 72 hours.  RADIOLOGY: Dg Chest 2 View  02/13/2016  CLINICAL DATA:  50 year old male with history of anterior chest pain for Dale past 2 days. Low-grade fever. Dizziness. EXAM: CHEST  2 VIEW COMPARISON:  Chest x-ray 06/01/2009. FINDINGS: Lung volumes are  normal. No consolidative airspace disease. No pleural effusions. No pneumothorax. No pulmonary nodule or mass noted. Pulmonary vasculature and Dale cardiomediastinal silhouette are within normal limits. IMPRESSION: No radiographic evidence of acute cardiopulmonary disease. Electronically Signed   By: Trudie Reed M.D.   On: 02/13/2016 17:57   Dg Thoracic Spine 2 View  02/13/2016  CLINICAL DATA:  O thoracic and lower back pain for 1 week. Fall 2 days ago. Pain was before Dale fall. EXAM: THORACIC SPINE 2 VIEWS COMPARISON:  Chest x-ray today and 06/01/2009 FINDINGS: There is no evidence of thoracic spine fracture. Alignment is normal. No other significant bone abnormalities are identified. IMPRESSION: Negative. Electronically Signed   By: Charlett Nose M.D.   On: 02/13/2016 20:45   Dg Lumbar Spine Complete  02/13/2016  CLINICAL DATA:  Low back pain and recent fall. EXAM: LUMBAR SPINE - COMPLETE 4+ VIEW COMPARISON:  08/25/2014 lumbar spine radiographs. FINDINGS: This report assumes 5 non rib-bearing lumbar vertebrae. Lumbar vertebral body heights are preserved, with no fracture. There is mild loss of disc height at L4-5 with associated minimal spondylosis, not appreciably changed. Otherwise preserved lumbar disc spaces. There is new 3 mm anterolisthesis at L4-5. No appreciable facet arthropathy. No appreciable foraminal stenosis. No aggressive appearing focal osseous lesions. IMPRESSION: 1. No lumbar spine fracture. 2. Stable mild degenerative disc disease at L4-5. 3. New mild 3 mm anterolisthesis at L4-5. Electronically Signed   By: Delbert Phenix M.D.   On: 02/13/2016 20:47   Ct Head Wo Contrast  02/16/2016  CLINICAL DATA:  Fever and altered mental status; tremors EXAM: CT HEAD WITHOUT CONTRAST TECHNIQUE: Contiguous axial images were obtained from Dale base of Dale skull through Dale vertex without intravenous contrast. COMPARISON:  June 01, 2009 FINDINGS: Dale ventricles are mildly enlarged in a generalized  manner and appear larger compared to Dale prior study. Dale sulci appear unremarkable. There is no intracranial mass, hemorrhage, extra-axial fluid collection, or midline shift. No focal gray-white compartment lesions are identified. No evidence of acute infarct. Bony calvarium appears intact. Dale mastoid air cells are clear. There is opacification in Dale left maxillary antrum as well as in multiple ethmoid air cells bilaterally. There is mild mucosal thickening in Dale left sphenoid sinus region. No intraorbital lesions are apparent. IMPRESSION: Ventricles mildly enlarged with sulci appearing normal. This appearance potentially could represent a degree of normal pressure hydrocephalus. This finding also could be secondary to prior or recent meningitis meningitis. No intracranial mass, hemorrhage, or extra-axial fluid collection. No acute infarct evident. Multifocal sinusitis. Given these current findings, brain MRI pre and post-contrast may be helpful to further assess, in particular to further evaluate  Dale meningeal regions. Electronically Signed   By: Bretta Bang III M.D.   On: 02/16/2016 14:04   Dg Chest Port 1 View  02/20/2016  CLINICAL DATA:  CHF EXAM: PORTABLE CHEST 1 VIEW COMPARISON:  Chest radiograph from one day prior. FINDINGS: Right internal jugular Swan-Ganz catheter terminates over Dale central right lower lung, unchanged. Endotracheal tube tip is 2.4 cm above Dale carina. Enteric tube enters Dale stomach with Dale tip not seen on this image. Intra-aortic balloon pump marker terminates over Dale proximal descending thoracic aorta at Dale T7-8 level. Stable cardiomediastinal silhouette with normal heart size. No pneumothorax. No pleural effusion. Hazy opacities throughout Dale bilateral parahilar lungs are not appreciably changed. Probable complete left lower lobe atelectasis, unchanged. IMPRESSION: 1. Right internal jugular Swan-Ganz catheter terminates over Dale central lower right lung, consider  retracting 3-4 cm . 2. Otherwise well-positioned support structures as described. 3. Stable hazy opacities throughout Dale parahilar lungs, favor pulmonary edema. Electronically Signed   By: Delbert Phenix M.D.   On: 02/20/2016 09:34   Dg Chest Port 1 View  02/19/2016  CLINICAL DATA:  CHF EXAM: PORTABLE CHEST 1 VIEW COMPARISON:  02/18/2016 FINDINGS: Support devices remain in place, unchanged. Heart is normal size. Diffuse interstitial and alveolar opacities are again noted, likely edema. This is somewhat more confluent in both lung bases, left greater than right. Cannot completely exclude infiltrate at Dale left base. No visible effusions. No acute bony abnormality. IMPRESSION: Continued diffuse interstitial and alveolar opacities, likely edema. Somewhat more focal opacities in Dale lung bases, left greater than right. Cannot exclude pneumonia. Electronically Signed   By: Charlett Nose M.D.   On: 02/19/2016 08:29   Dg Chest Port 1 View  02/18/2016  CLINICAL DATA:  Entry aortic balloon pump asist. EXAM: PORTABLE CHEST 1 VIEW COMPARISON:  February 17, 2016. FINDINGS: Dale heart size and mediastinal contours are within normal limits. No pneumothorax or pleural effusion is noted. Endotracheal nasogastric tubes are unchanged in position. Right internal jugular Swan-Ganz catheter is unchanged with distal tip in expected position of right pulmonary artery. Distal tip of aortic balloon catheter is seen over expected position of superior portion of descending thoracic aorta just below aortic knob. Stable bilateral perihilar opacities are noted concerning for edema or inflammation. Dale visualized skeletal structures are unremarkable. IMPRESSION: Distal tip of aortic balloon catheter appears to be slightly more inferior in position compared to prior exam, just below aortic knob currently. This projects over Dale expected position of Dale superior aspect of descending thoracic aorta. Stable bilateral perihilar opacities are noted  concerning for edema or inflammation. Otherwise stable support apparatus. Electronically Signed   By: Lupita Raider, M.D.   On: 02/18/2016 07:24   Dg Chest Port 1 View  02/17/2016  CLINICAL DATA:  Ventilator dependence. EXAM: PORTABLE CHEST 1 VIEW COMPARISON:  Earlier Dale same day. FINDINGS: 1822 hours. Endotracheal tube tip is 7.4 cm above Dale base of Dale carina. Right IJ pulmonary artery catheter tip is in Dale interlobar pulmonary artery. Dale tip of Dale intra-aortic balloon pump projects over Dale inferior aspect of Dale transverse aorta. Dale NG tube passes into Dale stomach although Dale distal tip position is not included on Dale film. Proximal port of Dale NG tube is at or just above Dale EG junction. Diffuse interstitial and central alveolar opacities suggest edema. Telemetry leads overlie Dale chest. IMPRESSION: Dale tip of Dale intra-aortic balloon pump projects over Dale inferior aspect of Dale transverse aorta. Electronically Signed  By: Kennith Center M.D.   On: 02/17/2016 18:34   Dg Chest Port 1 View  02/17/2016  CLINICAL DATA:  50 year old male with a history of Swan-Ganz catheter placement. EXAM: PORTABLE CHEST 1 VIEW COMPARISON:  02/17/2016 FINDINGS: Cardiomediastinal silhouette unchanged in size and contour. Skull pads again project over Dale thorax. Unchanged endotracheal tube, gastric tube. Interval placement of right IJ sheath, through which a Swan-Ganz catheter has been placed terminating in Dale right pulmonary arteries. Persisting bilateral airspace opacity IMPRESSION: Interval placement of right IJ sheath, through which a Swan-Ganz catheter has been placed and terminates in Dale right pulmonary arteries at Dale hilum. Similar appearance of bilateral airspace opacities, potentially infection and/or edema. Unchanged support apparatus as above. Signed, Yvone Neu. Loreta Ave, DO [facility] Electronically Signed   By: Gilmer Mor D.O.   On: 02/17/2016  13:53   Dg Chest Port 1 View  02/17/2016  CLINICAL DATA:  Hypoxia EXAM: PORTABLE CHEST 1 VIEW COMPARISON:  February 16, 2016 FINDINGS: Endotracheal tube tip is 3.5 cm above Dale carina. Nasogastric tube tip and side port are in Dale stomach. No pneumothorax evident. There is interstitial edema throughout Dale lungs bilaterally, perhaps slightly increased from 1 day prior. No airspace consolidation. Heart is upper normal in size with pulmonary vascularity within normal limits. No adenopathy. IMPRESSION: Tube positions as described without pneumothorax. Slight increase in interstitial edema. No airspace consolidation. No change in cardiac silhouette. Electronically Signed   By: Bretta Bang III M.D.   On: 02/17/2016 07:31   Dg Chest Portable 1 View  02/16/2016  CLINICAL DATA:  Endotracheal tube placement EXAM: PORTABLE CHEST 1 VIEW COMPARISON:  Chest x-ray from earlier same day. FINDINGS: Endotracheal tube well positioned with tip approximately 2 cm above Dale carina. Enteric tube passes below Dale diaphragm. Heart size is normal. There is central pulmonary vascular congestion and mild interstitial edema. Suspect small left pleural effusion. No pneumothorax. Osseous structures about Dale chest are unremarkable. IMPRESSION: 1. Endotracheal tube well positioned with tip approximately 2 cm above Dale carina. 2. Central pulmonary vascular congestion and mild bilateral interstitial edema suggesting mild volume overload/resuscitation efforts. No frank alveolar pulmonary edema. Electronically Signed   By: Bary Richard M.D.   On: 02/16/2016 19:38   Dg Chest Port 1 View  02/16/2016  CLINICAL DATA:  Altered mental status.  Depression. EXAM: PORTABLE CHEST 1 VIEW COMPARISON:  02/13/2016. FINDINGS: Midline trachea. Normal heart size and mediastinal contours. Left costophrenic angle exclusion. No pleural effusion or pneumothorax. Clear lungs. IMPRESSION: No active disease. Electronically Signed   By: Jeronimo Greaves M.D.   On:  02/16/2016 13:48   Ct Portable Head W/o Cm  02/17/2016  CLINICAL DATA:  50 year old male with a history of hydrocephalus. Acute meningitis. EXAM: CT HEAD WITHOUT CONTRAST TECHNIQUE: Contiguous axial images were obtained from Dale base of Dale skull through Dale vertex without intravenous contrast. COMPARISON:  02/16/2016 FINDINGS: Unremarkable appearance of Dale calvarium without acute fracture or aggressive lesion. Unremarkable appearance of Dale scalp soft tissues. Unremarkable appearance of Dale bilateral orbits. Partially visualized endotracheal tube.  Oral enteric tube. Near complete opacification of Dale left maxillary sinus with frothy secretions. Trace secretions of Dale right maxillary sinus. Mucoperiosteal thickening of Dale ethmoid air cells and Dale sphenoid sinuses. Trace disease of Dale frontal sinuses. Mastoid air cells are clear. Layered fluid within Dale nasopharynx. Configuration Dale ventricles is similar to comparison with bifrontal diameter measuring 40 mm, unchanged from prior. Unchanged appearance of Dale  hemispheric sulci. No acute intracranial hemorrhage.  No midline shift. Gray-white differentiation relatively maintained. IMPRESSION: Unchanged ventricular dilation and appearance of Dale cerebral sulci, with no evidence of acute hemorrhage or infarction. Signed, Yvone Neu. Loreta Ave, DO [facility] Electronically Signed   By: Gilmer Mor D.O.   On: 02/17/2016 07:23    PHYSICAL EXAM General: Intubated/sedated.  Neck: JVP not elevated, no thyromegaly or thyroid nodule.  Lungs: Crackles dependently bilaterally.  CV: Nondisplaced PMI.  Heart regular S1/S2, no S3, no murmur.  No peripheral edema.  Abdomen: Soft, no hepatosplenomegaly, no distention.  Neurologic: Intubated/sedated.  Psych: Normal affect. Extremities: No clubbing or cyanosis. IABP in place, pedal pulses can be dopplered.   TELEMETRY: Reviewed telemetry pt in NSR in  90s  ASSESSMENT AND PLAN: 50 yo with minimal past medical history was admitted with confusion, prior URI symptoms.  Had LP concerning for meningitis and found to have strep bacteremia/meningitis.  Later developed shock requiring pressors and was intubated for airway protection.  Bedside echo showed EF about 10%.  Swan placed, noted cardiogenic shock and had IABP.   1. Shock: Primarily appears to have cardiogenic shock.  Cardiac output much improved, we are weaning inotropes and he is off norepinephrine.  Still has IABP. - Stop milrinone today, keep dobutamine on at 2.5 mcg/kg/min.  - Wean IABP to 1:2, if he tolerates this, decrease to 1:3 and stop heparin in preparation for discontinuation of IABP hopefully today.  2. Acute hypoxemic respiratory failure: CXR looked like pulmonary edema.  Oxygen requirement has decreased with diuresis.   3. ID: Strep bacteremia with meningitis: On ceftriaxone. WBCs coming down.  He is being treated with 4 days of dexamethasone. 4. AKI: Creatinine coming down.  5. Acute systolic CHF: EF 16-10% on 3/16 echo.  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  Cardiac output much improved.  PCWP 13 with CVP 8 currently.  - Hold Lasix. - Wean inotropes as above. Hopefully can get IABP out today. - Continue spironolactone, can increase to 25 daily.   6. Elevated LFTs: Suspect shock liver. LFTs improving.  7. Cardiac arrest: PEA arrest then ventricular fibrillation in ER.  Now on amiodarone in NSR.   35 min critical care time.   Marca Ancona 02/20/2016 9:46 AM

## 2016-02-20 NOTE — Progress Notes (Signed)
Unit secretary called for intermittent suction repair (as related to ETT)- RN aware.

## 2016-02-20 NOTE — Progress Notes (Signed)
ANTICOAGULATION CONSULT NOTE - Follow Up Consult  Pharmacy Consult for heparin and ceftriaxone Indication: IABP/bacterial meningitis  No Known Allergies  Patient Measurements: Height: 5' 8.9" (175 cm) Weight: 149 lb 11.1 oz (67.9 kg) IBW/kg (Calculated) : 70.47   Vital Signs: Temp: 99 F (37.2 C) (03/18 0700) Temp Source: Core (Comment) (03/18 0400) BP: 112/61 mmHg (03/18 0700) Pulse Rate: 90 (03/18 0700)  Labs:  Recent Labs  02/17/16 0845  02/18/16 0500  02/18/16 1550  02/19/16 0400 02/19/16 1143 02/20/16 0400  HGB  --   < > 11.0*  --   --   --  10.2*  --  9.6*  HCT  --   --  30.4*  --   --   --  29.2*  --  27.7*  PLT  --   --  144*  --   --   --  161  --  168  HEPARINUNFRC <0.10*  < >  --   < >  --   < > 0.19* 0.26* 0.40  CREATININE  --   < > 1.83*  --  1.79*  --  1.86*  --  1.59*  CKTOTAL 540*  --   --   --   --   --   --   --   --   TROPONINI 4.74*  --   --   --   --   --   --   --   --   < > = values in this interval not displayed.  Estimated Creatinine Clearance: 52.8 mL/min (by C-G formula based on Cr of 1.59).   Medications:  Infusions:  . sodium chloride 250 mL (02/19/16 2000)  . amiodarone 30 mg/hr (02/19/16 2023)  . cisatracurium (NIMBEX) infusion 3 mcg/kg/min (02/19/16 2000)  . DOBUTamine 2.5 mcg/kg/min (02/20/16 0100)  . feeding supplement (VITAL AF 1.2 CAL) 1,000 mL (02/19/16 2000)  . fentaNYL infusion INTRAVENOUS 200 mcg/hr (02/20/16 0630)  . heparin 1,100 Units/hr (02/20/16 0100)  . insulin (NOVOLIN-R) infusion Stopped (02/18/16 1700)  . midazolam (VERSED) infusion 2 mg/hr (02/20/16 0000)  . milrinone 0.125 mcg/kg/min (02/19/16 2000)  . norepinephrine (LEVOPHED) Adult infusion Stopped (02/19/16 1257)    Assessment: 50 yo M admitted 02/16/2016 with bacterial meningitis and cardiac arrest on 3/15 resulting in IABP placement. Pharmacy is consulted to dose ceftriaxone and heparin.  3/14 BCx: strep pneumo (S-CTX) 3/14 UCx:  ngtd 3/14 CSF: strep  pneumo (S-CTX) 3/14 MRSA - NEG 3/15 BCx - ngtd  Patient continues on ceftriaxone. WBC improved to 14.4, Afebrile.  Patient with IABP for septic/cardiogenic shock. HL 0.40 (therapeutic), Hgb 9.6, Plt wnl. No s/sx of bleeding noted.  Goal of Therapy:  Heparin level 0.2-0.5 units/ml Monitor platelets by anticoagulation protocol: Yes  Eradication of infection   Plan:  - Continue heparin 1100 units/hr - F/u plans to hold heparin for IABP pull eventually - Monitor daily HL, CBC and s/sx of bleeding - Continue Ceftriaxone 2g IV q12h - Pharmacy will sign off ceftriaxone as no dose adjustments are anticipated  Casilda Carlsaylor Domanic Matusek, PharmD. PGY-1 Pharmacy Resident Pager: [phone] 02/20/2016,7:44 AM

## 2016-02-20 NOTE — Progress Notes (Signed)
Order for sheath removal verified per post procedural orders.Patient intubated and sedated, vital were stable, strong distal pulse(LDP). Lt femoral artery access site assessed: level 0, palpable  7.5 French IABP was removed and manual pressure applied for 35 minutes. Pre, peri, & post procedural vitals: HR 85-90, RR on vent, O2 Sat upper 96-99, BP 126/75. Distal pulses remained intact after sheath removal. Access site level 0 and dressed with 4X4 gauze and tegaderm.  RN confirmed condition of site. Post procedural instructions discussed with Nurse.

## 2016-02-21 ENCOUNTER — Inpatient Hospital Stay (HOSPITAL_COMMUNITY): Payer: Medicaid Other

## 2016-02-21 LAB — HEPARIN LEVEL (UNFRACTIONATED)

## 2016-02-21 LAB — BLOOD GAS, ARTERIAL
ACID-BASE EXCESS: 7.7 mmol/L — AB (ref 0.0–2.0)
Bicarbonate: 31.9 mEq/L — ABNORMAL HIGH (ref 20.0–24.0)
DRAWN BY: 413081
FIO2: 0.5
O2 SAT: 96.7 %
PCO2 ART: 46.7 mmHg — AB (ref 35.0–45.0)
PEEP: 5 cmH2O
PH ART: 7.449 (ref 7.350–7.450)
Patient temperature: 98.6
RATE: 20 resp/min
TCO2: 33.4 mmol/L (ref 0–100)
VT: 410 mL
pO2, Arterial: 89.3 mmHg (ref 80.0–100.0)

## 2016-02-21 LAB — GLUCOSE, CAPILLARY
GLUCOSE-CAPILLARY: 125 mg/dL — AB (ref 65–99)
GLUCOSE-CAPILLARY: 138 mg/dL — AB (ref 65–99)
GLUCOSE-CAPILLARY: 144 mg/dL — AB (ref 65–99)
GLUCOSE-CAPILLARY: 145 mg/dL — AB (ref 65–99)
Glucose-Capillary: 152 mg/dL — ABNORMAL HIGH (ref 65–99)
Glucose-Capillary: 154 mg/dL — ABNORMAL HIGH (ref 65–99)
Glucose-Capillary: 153 mg/dL — ABNORMAL HIGH (ref 65–99)
Glucose-Capillary: 123 mg/dL — ABNORMAL HIGH (ref 65–99)

## 2016-02-21 LAB — CBC
HEMATOCRIT: 29.7 % — AB (ref 39.0–52.0)
HEMOGLOBIN: 9.7 g/dL — AB (ref 13.0–17.0)
MCH: 29.8 pg (ref 26.0–34.0)
MCHC: 32.7 g/dL (ref 30.0–36.0)
MCV: 91.1 fL (ref 78.0–100.0)
Platelets: 191 10*3/uL (ref 150–400)
RBC: 3.26 MIL/uL — ABNORMAL LOW (ref 4.22–5.81)
RDW: 14.8 % (ref 11.5–15.5)
WBC: 11.9 10*3/uL — AB (ref 4.0–10.5)

## 2016-02-21 LAB — BASIC METABOLIC PANEL
ANION GAP: 8 (ref 5–15)
BUN: 44 mg/dL — ABNORMAL HIGH (ref 6–20)
CALCIUM: 7.4 mg/dL — AB (ref 8.9–10.3)
CHLORIDE: 114 mmol/L — AB (ref 101–111)
CO2: 31 mmol/L (ref 22–32)
CREATININE: 1.58 mg/dL — AB (ref 0.61–1.24)
GFR calc non Af Amer: 49 mL/min — ABNORMAL LOW (ref >60)
GFR, EST AFRICAN AMERICAN: 57 mL/min — AB (ref >60)
Glucose, Bld: 140 mg/dL — ABNORMAL HIGH (ref 65–99)
Potassium: 4 mmol/L (ref 3.5–5.1)
SODIUM: 153 mmol/L — AB (ref 135–145)

## 2016-02-21 LAB — CARBOXYHEMOGLOBIN
Carboxyhemoglobin: 0.8 % (ref 0.5–1.5)
METHEMOGLOBIN: 1 % (ref 0.0–1.5)
O2 SAT: 82.1 %
TOTAL HEMOGLOBIN: 10 g/dL — AB (ref 13.5–18.0)

## 2016-02-21 LAB — MAGNESIUM: MAGNESIUM: 2.5 mg/dL — AB (ref 1.7–2.4)

## 2016-02-21 LAB — PROTIME-INR
INR: 1.13 (ref 0.00–1.49)
PROTHROMBIN TIME: 14.6 s (ref 11.6–15.2)

## 2016-02-21 LAB — PHOSPHORUS: Phosphorus: 2.8 mg/dL (ref 2.5–4.6)

## 2016-02-21 LAB — APTT: aPTT: 33 seconds (ref 24–37)

## 2016-02-21 MED ORDER — AMIODARONE HCL 200 MG PO TABS
200.0000 mg | ORAL_TABLET | Freq: Two times a day (BID) | ORAL | Status: DC
  Administered 2016-02-21 – 2016-02-29 (×17): 200 mg via ORAL
  Filled 2016-02-21 (×18): qty 1

## 2016-02-21 MED ORDER — HEPARIN SODIUM (PORCINE) 5000 UNIT/ML IJ SOLN
5000.0000 [IU] | Freq: Three times a day (TID) | INTRAMUSCULAR | Status: DC
  Administered 2016-02-21 – 2016-03-10 (×53): 5000 [IU] via SUBCUTANEOUS
  Filled 2016-02-21 (×52): qty 1

## 2016-02-21 MED ORDER — FREE WATER
400.0000 mL | Freq: Four times a day (QID) | Status: DC
  Administered 2016-02-21 – 2016-02-23 (×6): 400 mL

## 2016-02-21 MED ORDER — FREE WATER
200.0000 mL | Freq: Four times a day (QID) | Status: DC
  Administered 2016-02-21 (×2): 200 mL

## 2016-02-21 MED ORDER — ISOSORB DINITRATE-HYDRALAZINE 20-37.5 MG PO TABS
0.5000 | ORAL_TABLET | Freq: Three times a day (TID) | ORAL | Status: DC
  Administered 2016-02-21 (×3): 0.5 via ORAL
  Filled 2016-02-21 (×4): qty 1

## 2016-02-21 NOTE — Progress Notes (Signed)
CVP prepared- RT left in PT room- per RN request- she would like to install.

## 2016-02-21 NOTE — Progress Notes (Signed)
PULMONARY / CRITICAL CARE MEDICINE   Name: Dale [name] MRN: [id number] DOB: 1966-01-01    ADMISSION DATE:  02/16/2016   CONSULTATION DATE:  02/16/16  REFERRING MD:  Rito Ehrlich  CHIEF COMPLAINT:  AMS   STUDIES:  3/14 CT head > multifocal sinusitis, enlarged ventricles 3/14 LP >> glucose < 20, protein > 600, WBC 340 (88%N), 89 RBC  CULTURES: 3/14 Blood > strep pneumo 3/14 Urine >neg 3/14 CSF > strep pneumo   ANTIBIOTICS: 3/14 Ampicillin >> 3/14 3/14 Vancomycin >>off 3/14 Rocephin >>  LINES/TUBES: 3/14 ETT >> 3/15 Rt femoral CVL >> 3/15 Rt femoral aline >> 3/15 Rt IJ introducer >>     SIGNIFICANT EVENTS: 3/14 Admit, VDRF, ID/cardiology/neurology consulted 3/15 Cardiac arrest, 5 mins, Asystole > VF > ST; ARDS, septic/cardiogenic shock >> IABP; start paralytic 3/16 - Remains on paralytic, pressors, inotropes. 3/18 - IABP to be removed 3/18 stopping nimbex   SUBJECTIVE/OVERNIGHT/INTERVAL HX IABP removed, pressors weaning to off Nimbex stopped and well tolerated Sedation holiday this am, has not interacted yet so gtt's still off  VITAL SIGNS: BP 156/79 mmHg  Pulse 87  Temp(Src) 97.9 F (36.6 C) (Core (Comment))  Resp 18  Ht 5' 8.9" (1.75 m)  Wt 64.7 kg (142 lb 10.2 oz)  BMI 21.13 kg/m2  SpO2 97%  HEMODYNAMICS: PAP: (24-40)/(13-22) 24/19 mmHg CVP:  [6 mmHg-16 mmHg] 11 mmHg PCWP:  [15 mmHg] 15 mmHg CO:  [6 L/min-7.2 L/min] 6 L/min CI:  [3.3 L/min/m2-3.9 L/min/m2] 3.3 L/min/m2  VENTILATOR SETTINGS: Vent Mode:  [-] PRVC FiO2 (%):  [40 %-50 %] 50 % Set Rate:  [20 bmp] 20 bmp Vt Set:  [410 mL] 410 mL PEEP:  [5 cmH20] 5 cmH20 Plateau Pressure:  [13 cmH20-19 cmH20] 13 cmH20  INTAKE / OUTPUT: I/O last 3 completed shifts: In: 4430.6 [I.V.:2690.6; NG/GT:1640; IV Piggyback:100] Out: 6482 [Urine:6482]  PHYSICAL EXAMINATION: General: paralyzed Neuro: sedate, grimace w stim, RN reports opened eyes for her HEENT: ETT in place Cardiac: regular Chest: b/l  crackles Abd: soft Ext: cool Skin: no rashes  LABS:  PULMONARY  Recent Labs Lab 02/18/16 1004  02/18/16 1213 02/19/16 0400 02/19/16 0425 02/20/16 0405 02/20/16 0425 02/21/16 0349 02/21/16 0725  PHART 7.493*  --  7.481* 7.553*  --  7.420  --  7.449  --   PCO2ART 29.8*  --  31.5* 28.6*  --  45.2*  --  46.7*  --   PO2ART 57.0*  --  63.0* 101*  --  84.7  --  89.3  --   HCO3 23.2  --  23.9 25.4*  --  28.8*  --  31.9*  --   TCO2 24  --  25 26.3  --  30.2  --  33.4  --   O2SAT 94.0  < > 95.0 97.8 75.4 94.8 73.0 96.7 82.1  < > = values in this interval not displayed.  CBC  Recent Labs Lab 02/19/16 0400 02/20/16 0400 02/21/16 0350  HGB 10.2* 9.6* 9.7*  HCT 29.2* 27.7* 29.7*  WBC 21.5* 14.4* 11.9*  PLT 161 168 191    COAGULATION  Recent Labs Lab 02/17/16 0053 02/21/16 0350  INR 1.14 1.13    CARDIAC    Recent Labs Lab 02/16/16 2016 02/17/16 0033 02/17/16 0500 02/17/16 0845  TROPONINI 7.59* 13.02* 6.43* 4.74*   No results for input(s): PROBNP in the last 168 hours.   CHEMISTRY  Recent Labs Lab 02/17/16 1903 02/18/16 0500 02/18/16 1550 02/19/16 0400 02/20/16 0400 02/21/16 0445  NA  137 134* 138 142 147* 153*  K 3.7 3.1* 3.6 3.4* 3.8 4.0  CL 102 102 101 105 109 114*  CO2 20* 18* GLUCOSE 248* 208* 148* 116* 131* 140*  BUN 28* 28* 28* 30* 37* 44*  CREATININE 1.82* 1.83* 1.79* 1.86* 1.59* 1.58*  CALCIUM 6.2* 6.6* 6.8* 7.0* 7.1* 7.4*  MG 2.0 2.0  --  2.0 2.3 2.5*  PHOS 2.7 1.7*  --  1.7* 2.9 2.8   Estimated Creatinine Clearance: 50.6 mL/min (by C-G formula based on Cr of 1.58).   LIVER  Recent Labs Lab 02/16/16 1200 02/17/16 0033 02/17/16 0053 02/18/16 0500 02/19/16 0400 02/20/16 0400 02/21/16 0350  AST 117* 174*  --  515* 142* 86*  --   ALT 42 45  --  100* 83* 79*  --   ALKPHOS 56 39  --  42 62 45  --   BILITOT 1.1 1.7*  --  0.3 0.3 0.4  --   PROT 7.5 5.0*  --  4.9* 5.0* 5.1*  --   ALBUMIN 2.9* 1.7*  --  1.5* 1.5* 1.6*   --   INR  --   --  1.14  --   --   --  1.13     INFECTIOUS  Recent Labs Lab 02/16/16 2006 02/17/16 0030 02/17/16 0053 02/20/16 1630  LATICACIDVEN 1.5 4.9*  --  1.1  PROCALCITON  --   --  73.53  --      ENDOCRINE CBG (last 3)   Recent Labs  02/21/16 0353 02/21/16 0825 02/21/16 1239  GLUCAP 125* 154* 153*         IMAGING x48h Dg Chest Port 1 View  02/21/2016  CLINICAL DATA:  Respiratory failure EXAM: PORTABLE CHEST 1 VIEW COMPARISON:  Chest radiograph from one day prior. FINDINGS: Right internal jugular Swan-Ganz catheter terminates in the right infrahilar region. Enteric tube enters the stomach with the tip not seen on this image. Endotracheal tube tip is 2.5 cm above the carina. Stable cardiomediastinal silhouette with normal heart size. No pneumothorax. Stable small left pleural effusion. No pulmonary edema. Stable left lower lobe consolidation. IMPRESSION: 1. Well-positioned endotracheal tube. Right internal jugular Swan-Ganz catheter terminates over the right infrahilar region, consider retracting 2-3 cm. 2. Stable small left pleural effusion. 3. Stable left lower lobe consolidation, favor atelectasis. Electronically Signed   By: Delbert Phenix M.D.   On: 02/21/2016 08:00   Dg Chest Port 1 View  02/20/2016  CLINICAL DATA:  CHF EXAM: PORTABLE CHEST 1 VIEW COMPARISON:  Chest radiograph from one day prior. FINDINGS: Right internal jugular Swan-Ganz catheter terminates over the central right lower lung, unchanged. Endotracheal tube tip is 2.4 cm above the carina. Enteric tube enters the stomach with the tip not seen on this image. Intra-aortic balloon pump marker terminates over the proximal descending thoracic aorta at the T7-8 level. Stable cardiomediastinal silhouette with normal heart size. No pneumothorax. No pleural effusion. Hazy opacities throughout the bilateral parahilar lungs are not appreciably changed. Probable complete left lower lobe atelectasis, unchanged.  IMPRESSION: 1. Right internal jugular Swan-Ganz catheter terminates over the central lower right lung, consider retracting 3-4 cm . 2. Otherwise well-positioned support structures as described. 3. Stable hazy opacities throughout the parahilar lungs, favor pulmonary edema. Electronically Signed   By: Delbert Phenix M.D.   On: 02/20/2016 09:34     50 yo homeless male smoker with hx of ETOH developed upper respiratory symptoms.  Developed progressive confusion, lactic acidosis,  AKI. 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  Suffered cardiac arrest, cardiogenic shock with acute systolic CHF, ARDS 3/15.  ASSESSMENT / PLAN:  PULMONARY A:  Acute respiratory failure 2nd to ARDS, acute pulmonary edema. P:   Wean PEEP, FiO2 per ARDS protocol 6 cc/kg for Vt F/u CXR Goal begin SBT's as MS will allow  CARDIOVASCULAR A:  Septic/cardiogenic shock >> mostly cardiogenic now. Acute systolic CHF. Cardiac arrest 3/15. NSTEMI. P:  Lasix, lopressor,  amiodarone, dobutamine, heparin gtt per cardiology IABP out  RENAL Lab Results  Component Value Date   CREATININE 1.58* 02/21/2016   CREATININE 1.59* 02/20/2016   CREATININE 1.86* 02/19/2016    Recent Labs Lab 02/19/16 0400 02/20/16 0400 02/21/16 0445  K 3.4* 3.8 4.0   A:   AKI 2nd to ATN. Lactic acidosis. Hypokalemia. - repleted P:   Follow BMP Replace electrolytes as indicated Increase free water  GASTROINTESTINAL A:   Nutrition. Shock liver. P:   Tube feeds Protonix F/u LFTs  HEMATOLOGIC A:   Leukocytosis. P:  F/u CBC  INFECTIOUS A:   Streptococcal bacteremia and meningitis. P:   Day 6 vancomycin, ceftriaxone, decadron per ID  ENDOCRINE CBG (last 3)   Recent Labs  02/21/16 0353 02/21/16 0825 02/21/16 1239  GLUCAP 125* 154* 153*   A:   Hyperglycemia. Relative adrenal insufficiency >> cortisol 15.2 from 3/15. P:   Insulin gtt Decadron  NEUROLOGIC A:   Acute metabolic encephalopathy. Hx of ETOH, THC.  P:    Thiamine, folic acid RASS goal -1 Off sedation currently, follow MS. Repeat imaging if he isn't starting to clear MS   AUTOIMMUNE A - no prior hx of autoimmune diz P - following for anca, ana, ds-dna results  FAMILY - no family at bedside 02/21/2016   Independent CC time 34 minutes   Levy Pupaobert Ahlana Slaydon, MD, PhD 02/21/2016, 3:49 PM [facility] [phone] or if no answer [phone]

## 2016-02-21 NOTE — Progress Notes (Signed)
RT spoke with RN on 2 occassions- RN would like to address with CCM concerning femoral A-line before setup is changed.

## 2016-02-21 NOTE — Progress Notes (Signed)
Patient ID: Dale [name], male   DOB: [birth year], 50 y.o.   MRN: [id number]    SUBJECTIVE: Hemodynamics good today, now off pressors and IABP out on 3/18.  Remains on dobutamine 2.5.   Swan: CVP 6 PA 39/21 PCWP 15 CI 3.9 Co-ox 82%  Scheduled Meds: . amiodarone  200 mg Oral BID  . antiseptic oral rinse  7 mL Mouth Rinse 10 times per day  . aspirin  81 mg Oral Daily  . cefTRIAXone (ROCEPHIN)  IV  2 g Intravenous Q12H  . chlorhexidine gluconate  15 mL Mouth Rinse BID  . folic acid  1 mg Per Tube Daily  . free water  200 mL Per Tube Q6H  . insulin aspart  2-6 Units Subcutaneous 6 times per day  . insulin aspart  3 Units Subcutaneous 6 times per day  . insulin glargine  20 Units Subcutaneous Q24H  . isosorbide-hydrALAZINE  0.5 tablet Oral TID  . pantoprazole sodium  40 mg Per Tube Q24H  . sodium chloride flush  10-40 mL Intracatheter Q12H  . spironolactone  25 mg Per Tube Daily  . thiamine  100 mg Per Tube Daily   Continuous Infusions: . sodium chloride 250 mL (02/20/16 1254)  . sodium chloride 10 mL/hr at 02/20/16 1200  . cisatracurium (NIMBEX) infusion Stopped (02/21/16 0230)  . DOBUTamine 2.5 mcg/kg/min (02/20/16 0800)  . feeding supplement (VITAL AF 1.2 CAL) Stopped (02/20/16 2300)  . fentaNYL infusion INTRAVENOUS 200 mcg/hr (02/20/16 2227)  . heparin Stopped (02/20/16 1535)  . midazolam (VERSED) infusion 3 mg/hr (02/21/16 0200)  . norepinephrine (LEVOPHED) Adult infusion Stopped (02/19/16 1257)   PRN Meds:.acetaminophen (TYLENOL) oral liquid 160 mg/5 mL, albuterol, fentaNYL, fentaNYL, fentaNYL (SUBLIMAZE) injection, midazolam, midazolam, midazolam, rocuronium, sodium chloride flush    Filed Vitals:   02/21/16 0400 02/21/16 0430 02/21/16 0500 02/21/16 0600  BP:      Pulse: 71 71 73 78  Temp: 97.5 F (36.4 C) 97.5 F (36.4 C) 97.5 F (36.4 C) 97.9 F (36.6 C)  TempSrc:      Resp: 20 20 20 20   Height:      Weight:   142 lb 10.2 oz (64.7 kg)   SpO2: 97% 98% 98% 96%     Intake/Output Summary (Last 24 hours) at 02/21/16 0907 Last data filed at 02/21/16 0600  Gross per 24 hour  Intake 2531.29 ml  Output   4755 ml  Net -2223.71 ml    LABS: Basic Metabolic Panel:  Recent Labs  78/29/56 0400 02/21/16 0445  NA 147* 153*  K 3.8 4.0  CL 109 114*  CO2 27 31  GLUCOSE 131* 140*  BUN 37* 44*  CREATININE 1.59* 1.58*  CALCIUM 7.1* 7.4*  MG 2.3 2.5*  PHOS 2.9 2.8   Liver Function Tests:  Recent Labs  02/19/16 0400 02/20/16 0400  AST 142* 86*  ALT 83* 79*  ALKPHOS 62 45  BILITOT 0.3 0.4  PROT 5.0* 5.1*  ALBUMIN 1.5* 1.6*   No results for input(s): LIPASE, AMYLASE in the last 72 hours. CBC:  Recent Labs  02/20/16 0400 02/21/16 0350  WBC 14.4* 11.9*  HGB 9.6* 9.7*  HCT 27.7* 29.7*  MCV 88.2 91.1  PLT 168 191   Cardiac Enzymes: No results for input(s): CKTOTAL, CKMB, CKMBINDEX, TROPONINI in the last 72 hours. BNP: Invalid input(s): POCBNP D-Dimer: No results for input(s): DDIMER in the last 72 hours. Hemoglobin A1C: No results for input(s): HGBA1C in the last 72 hours. Fasting Lipid  Panel: No results for input(s): CHOL, HDL, LDLCALC, TRIG, CHOLHDL, LDLDIRECT in the last 72 hours. Thyroid Function Tests: No results for input(s): TSH, T4TOTAL, T3FREE, THYROIDAB in the last 72 hours.  Invalid input(s): FREET3 Anemia Panel: No results for input(s): VITAMINB12, FOLATE, FERRITIN, TIBC, IRON, RETICCTPCT in the last 72 hours.  RADIOLOGY: Dg Chest 2 View  02/13/2016  CLINICAL DATA:  50 year old male with history of anterior chest pain for the past 2 days. Low-grade fever. Dizziness. EXAM: CHEST  2 VIEW COMPARISON:  Chest x-ray 06/01/2009. FINDINGS: Lung volumes are normal. No consolidative airspace disease. No pleural effusions. No pneumothorax. No pulmonary nodule or mass noted. Pulmonary vasculature and the cardiomediastinal silhouette are within normal limits. IMPRESSION: No radiographic evidence of acute cardiopulmonary disease.  Electronically Signed   By: Trudie Reed M.D.   On: 02/13/2016 17:57   Dg Thoracic Spine 2 View  02/13/2016  CLINICAL DATA:  O thoracic and lower back pain for 1 week. Fall 2 days ago. Pain was before the fall. EXAM: THORACIC SPINE 2 VIEWS COMPARISON:  Chest x-ray today and 06/01/2009 FINDINGS: There is no evidence of thoracic spine fracture. Alignment is normal. No other significant bone abnormalities are identified. IMPRESSION: Negative. Electronically Signed   By: Charlett Nose M.D.   On: 02/13/2016 20:45   Dg Lumbar Spine Complete  02/13/2016  CLINICAL DATA:  Low back pain and recent fall. EXAM: LUMBAR SPINE - COMPLETE 4+ VIEW COMPARISON:  08/25/2014 lumbar spine radiographs. FINDINGS: This report assumes 5 non rib-bearing lumbar vertebrae. Lumbar vertebral body heights are preserved, with no fracture. There is mild loss of disc height at L4-5 with associated minimal spondylosis, not appreciably changed. Otherwise preserved lumbar disc spaces. There is new 3 mm anterolisthesis at L4-5. No appreciable facet arthropathy. No appreciable foraminal stenosis. No aggressive appearing focal osseous lesions. IMPRESSION: 1. No lumbar spine fracture. 2. Stable mild degenerative disc disease at L4-5. 3. New mild 3 mm anterolisthesis at L4-5. Electronically Signed   By: Delbert Phenix M.D.   On: 02/13/2016 20:47   Ct Head Wo Contrast  02/16/2016  CLINICAL DATA:  Fever and altered mental status; tremors EXAM: CT HEAD WITHOUT CONTRAST TECHNIQUE: Contiguous axial images were obtained from the base of the skull through the vertex without intravenous contrast. COMPARISON:  June 01, 2009 FINDINGS: The ventricles are mildly enlarged in a generalized manner and appear larger compared to the prior study. The sulci appear unremarkable. There is no intracranial mass, hemorrhage, extra-axial fluid collection, or midline shift. No focal gray-white compartment lesions are identified. No evidence of acute infarct. Bony calvarium  appears intact. The mastoid air cells are clear. There is opacification in the left maxillary antrum as well as in multiple ethmoid air cells bilaterally. There is mild mucosal thickening in the left sphenoid sinus region. No intraorbital lesions are apparent. IMPRESSION: Ventricles mildly enlarged with sulci appearing normal. This appearance potentially could represent a degree of normal pressure hydrocephalus. This finding also could be secondary to prior or recent meningitis meningitis. No intracranial mass, hemorrhage, or extra-axial fluid collection. No acute infarct evident. Multifocal sinusitis. Given these current findings, brain MRI pre and post-contrast may be helpful to further assess, in particular to further evaluate the meningeal regions. Electronically Signed   By: Bretta Bang III M.D.   On: 02/16/2016 14:04   Dg Chest Port 1 View  02/21/2016  CLINICAL DATA:  Respiratory failure EXAM: PORTABLE CHEST 1 VIEW COMPARISON:  Chest radiograph from one day prior. FINDINGS: Right internal  jugular Swan-Ganz catheter terminates in the right infrahilar region. Enteric tube enters the stomach with the tip not seen on this image. Endotracheal tube tip is 2.5 cm above the carina. Stable cardiomediastinal silhouette with normal heart size. No pneumothorax. Stable small left pleural effusion. No pulmonary edema. Stable left lower lobe consolidation. IMPRESSION: 1. Well-positioned endotracheal tube. Right internal jugular Swan-Ganz catheter terminates over the right infrahilar region, consider retracting 2-3 cm. 2. Stable small left pleural effusion. 3. Stable left lower lobe consolidation, favor atelectasis. Electronically Signed   By: Delbert PhenixJason A Poff M.D.   On: 02/21/2016 08:00   Dg Chest Port 1 View  02/20/2016  CLINICAL DATA:  CHF EXAM: PORTABLE CHEST 1 VIEW COMPARISON:  Chest radiograph from one day prior. FINDINGS: Right internal jugular Swan-Ganz catheter terminates over the central right lower lung,  unchanged. Endotracheal tube tip is 2.4 cm above the carina. Enteric tube enters the stomach with the tip not seen on this image. Intra-aortic balloon pump marker terminates over the proximal descending thoracic aorta at the T7-8 level. Stable cardiomediastinal silhouette with normal heart size. No pneumothorax. No pleural effusion. Hazy opacities throughout the bilateral parahilar lungs are not appreciably changed. Probable complete left lower lobe atelectasis, unchanged. IMPRESSION: 1. Right internal jugular Swan-Ganz catheter terminates over the central lower right lung, consider retracting 3-4 cm . 2. Otherwise well-positioned support structures as described. 3. Stable hazy opacities throughout the parahilar lungs, favor pulmonary edema. Electronically Signed   By: Delbert PhenixJason A Poff M.D.   On: 02/20/2016 09:34   Dg Chest Port 1 View  02/19/2016  CLINICAL DATA:  CHF EXAM: PORTABLE CHEST 1 VIEW COMPARISON:  02/18/2016 FINDINGS: Support devices remain in place, unchanged. Heart is normal size. Diffuse interstitial and alveolar opacities are again noted, likely edema. This is somewhat more confluent in both lung bases, left greater than right. Cannot completely exclude infiltrate at the left base. No visible effusions. No acute bony abnormality. IMPRESSION: Continued diffuse interstitial and alveolar opacities, likely edema. Somewhat more focal opacities in the lung bases, left greater than right. Cannot exclude pneumonia. Electronically Signed   By: Charlett NoseKevin  Dover M.D.   On: 02/19/2016 08:29   Dg Chest Port 1 View  02/18/2016  CLINICAL DATA:  Entry aortic balloon pump asist. EXAM: PORTABLE CHEST 1 VIEW COMPARISON:  February 17, 2016. FINDINGS: The heart size and mediastinal contours are within normal limits. No pneumothorax or pleural effusion is noted. Endotracheal nasogastric tubes are unchanged in position. Right internal jugular Swan-Ganz catheter is unchanged with distal tip in expected position of right pulmonary  artery. Distal tip of aortic balloon catheter is seen over expected position of superior portion of descending thoracic aorta just below aortic knob. Stable bilateral perihilar opacities are noted concerning for edema or inflammation. The visualized skeletal structures are unremarkable. IMPRESSION: Distal tip of aortic balloon catheter appears to be slightly more inferior in position compared to prior exam, just below aortic knob currently. This projects over the expected position of the superior aspect of descending thoracic aorta. Stable bilateral perihilar opacities are noted concerning for edema or inflammation. Otherwise stable support apparatus. Electronically Signed   By: Lupita RaiderJames  Green Jr, M.D.   On: 02/18/2016 07:24   Dg Chest Port 1 View  02/17/2016  CLINICAL DATA:  Ventilator dependence. EXAM: PORTABLE CHEST 1 VIEW COMPARISON:  Earlier the same day. FINDINGS: 1822 hours. Endotracheal tube tip is 7.4 cm above the base of the carina. Right IJ pulmonary artery catheter tip is in the interlobar pulmonary  artery. The tip of the intra-aortic balloon pump projects over the inferior aspect of the transverse aorta. The NG tube passes into the stomach although the distal tip position is not included on the film. Proximal port of the NG tube is at or just above the EG junction. Diffuse interstitial and central alveolar opacities suggest edema. Telemetry leads overlie the chest. IMPRESSION: The tip of the intra-aortic balloon pump projects over the inferior aspect of the transverse aorta. Electronically Signed   By: Kennith Center M.D.   On: 02/17/2016 18:34   Dg Chest Port 1 View  02/17/2016  CLINICAL DATA:  50 year old male with a history of Swan-Ganz catheter placement. EXAM: PORTABLE CHEST 1 VIEW COMPARISON:  02/17/2016 FINDINGS: Cardiomediastinal silhouette unchanged in size and contour. Skull pads again project over the thorax. Unchanged endotracheal tube, gastric tube. Interval placement of right IJ sheath,  through which a Swan-Ganz catheter has been placed terminating in the right pulmonary arteries. Persisting bilateral airspace opacity IMPRESSION: Interval placement of right IJ sheath, through which a Swan-Ganz catheter has been placed and terminates in the right pulmonary arteries at the hilum. Similar appearance of bilateral airspace opacities, potentially infection and/or edema. Unchanged support apparatus as above. Signed, Yvone Neu. Loreta Ave, DO [facility] Electronically Signed   By: Gilmer Mor D.O.   On: 02/17/2016 13:53   Dg Chest Port 1 View  02/17/2016  CLINICAL DATA:  Hypoxia EXAM: PORTABLE CHEST 1 VIEW COMPARISON:  February 16, 2016 FINDINGS: Endotracheal tube tip is 3.5 cm above the carina. Nasogastric tube tip and side port are in the stomach. No pneumothorax evident. There is interstitial edema throughout the lungs bilaterally, perhaps slightly increased from 1 day prior. No airspace consolidation. Heart is upper normal in size with pulmonary vascularity within normal limits. No adenopathy. IMPRESSION: Tube positions as described without pneumothorax. Slight increase in interstitial edema. No airspace consolidation. No change in cardiac silhouette. Electronically Signed   By: Bretta Bang III M.D.   On: 02/17/2016 07:31   Dg Chest Portable 1 View  02/16/2016  CLINICAL DATA:  Endotracheal tube placement EXAM: PORTABLE CHEST 1 VIEW COMPARISON:  Chest x-ray from earlier same day. FINDINGS: Endotracheal tube well positioned with tip approximately 2 cm above the carina. Enteric tube passes below the diaphragm. Heart size is normal. There is central pulmonary vascular congestion and mild interstitial edema. Suspect small left pleural effusion. No pneumothorax. Osseous structures about the chest are unremarkable. IMPRESSION: 1. Endotracheal tube well positioned with tip approximately 2 cm above the carina. 2. Central pulmonary vascular  congestion and mild bilateral interstitial edema suggesting mild volume overload/resuscitation efforts. No frank alveolar pulmonary edema. Electronically Signed   By: Bary Richard M.D.   On: 02/16/2016 19:38   Dg Chest Port 1 View  02/16/2016  CLINICAL DATA:  Altered mental status.  Depression. EXAM: PORTABLE CHEST 1 VIEW COMPARISON:  02/13/2016. FINDINGS: Midline trachea. Normal heart size and mediastinal contours. Left costophrenic angle exclusion. No pleural effusion or pneumothorax. Clear lungs. IMPRESSION: No active disease. Electronically Signed   By: Jeronimo Greaves M.D.   On: 02/16/2016 13:48   Ct Portable Head W/o Cm  02/17/2016  CLINICAL DATA:  50 year old male with a history of hydrocephalus. Acute meningitis. EXAM: CT HEAD WITHOUT CONTRAST TECHNIQUE: Contiguous axial images were obtained from the base of the skull through the vertex without intravenous contrast. COMPARISON:  02/16/2016 FINDINGS: Unremarkable appearance of the calvarium without acute fracture or aggressive lesion. Unremarkable appearance of  the scalp soft tissues. Unremarkable appearance of the bilateral orbits. Partially visualized endotracheal tube.  Oral enteric tube. Near complete opacification of the left maxillary sinus with frothy secretions. Trace secretions of the right maxillary sinus. Mucoperiosteal thickening of the ethmoid air cells and the sphenoid sinuses. Trace disease of the frontal sinuses. Mastoid air cells are clear. Layered fluid within the nasopharynx. Configuration the ventricles is similar to comparison with bifrontal diameter measuring 40 mm, unchanged from prior. Unchanged appearance of the hemispheric sulci. No acute intracranial hemorrhage.  No midline shift. Gray-white differentiation relatively maintained. IMPRESSION: Unchanged ventricular dilation and appearance of the cerebral sulci, with no evidence of acute hemorrhage or infarction. Signed, Yvone Neu. Loreta Ave, DO [facility] Electronically Signed   By: Gilmer Mor D.O.   On: 02/17/2016 07:23    PHYSICAL EXAM General: Intubated/sedated.  Neck: JVP not elevated, no thyromegaly or thyroid nodule.  Lungs: Crackles dependently bilaterally.  CV: Nondisplaced PMI.  Heart regular S1/S2, no S3, no murmur.  No peripheral edema.  Abdomen: Soft, no hepatosplenomegaly, no distention.  Neurologic: Intubated/sedated.  Extremities: No clubbing or cyanosis. Groin sites stable, IABP now out.   TELEMETRY: Reviewed telemetry pt in NSR in 90s  ASSESSMENT AND PLAN: 50 yo with minimal past medical history was admitted with confusion, prior URI symptoms.  Had LP concerning for meningitis and found to have strep bacteremia/meningitis.  Later developed shock requiring pressors and was intubated for airway protection.  Bedside echo showed EF about 10%.  Swan placed, noted cardiogenic shock and had IABP.   1. Shock: Primarily appears to have cardiogenic shock.  Cardiac output much improved, IABP out 3/18.  He is only on dobutamine 2.5 today.  BP higher.  Swan numbers good.  - Decrease dobutamine to 1.5 today.   - Can discontinue swan, will leave introducer to allow CVP and co-ox measurements.   2. Acute hypoxemic respiratory failure: CXR looked like pulmonary edema.  Oxygen requirement has decreased with diuresis.   3. ID: Strep bacteremia with meningitis: On ceftriaxone. WBCs coming down.  He is being treated with 4 days of dexamethasone. 4. AKI: Creatinine stable.  5. Acute systolic CHF: EF 40-98% on 3/16 echo.  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  Cardiac output much improved.  PCWP 15 with CVP 6 currently.  - Hold Lasix today. - Wean dobutamine as above. - Continue spironolactone, add Bidil 1/2 tab tid. - Continue to follow CVP and co-ox after Swan out.  6. Elevated LFTs: Suspect shock liver. LFTs improving.  7. Cardiac arrest: PEA arrest then ventricular fibrillation in ER.  Now on amiodarone in  NSR, can make amiodarone po today.  8. Neuro: Weaning sedation to see if he will awaken.  9. Hypernatremia: Add free water boluses.   35 min critical care time.   Marca Ancona 02/21/2016 9:07 AM

## 2016-02-21 NOTE — Progress Notes (Signed)
eLink Physician-Brief Progress Note Patient Name: Dale DemarkGraig D Mcclatchey DOB: 05/23/1966 MRN: [id number]   Date of Service  02/21/2016  HPI/Events of Note  RT wsayus femoaral a line is 3d old  eICU Interventions  Bedside MD > 7am to address     Intervention Category Intermediate Interventions: Other:  Quadasia Newsham 02/21/2016, 12:07 AM

## 2016-02-21 NOTE — Plan of Care (Signed)
Problem: Phase I Progression Outcomes Goal: Pneumonia/flu vaccination screen completed Outcome: Not Progressing Pt unresponsive and unable to ask if wanted. Goal: Patient tolerating nututrition at goal Outcome: Progressing Tube feeds infusing. Goal: Progressing towards optiumm acitivities Outcome: Not Progressing Pt intubated, unresponsive, and unable to move extremities. Goal: Patient tolerating weaning plan Outcome: Progressing FiO2 decreased to 40%. Goal: Optimized method of communication. Outcome: Not Progressing Pt unresponsive. Goal: Voiding-avoid urinary catheter unless indicated Outcome: Not Progressing Urinary catheter in place.

## 2016-02-22 ENCOUNTER — Inpatient Hospital Stay (HOSPITAL_COMMUNITY): Payer: Medicaid Other

## 2016-02-22 DIAGNOSIS — R197 Diarrhea, unspecified: Secondary | ICD-10-CM

## 2016-02-22 LAB — BASIC METABOLIC PANEL
ANION GAP: 10 (ref 5–15)
BUN: 46 mg/dL — AB (ref 6–20)
CO2: 31 mmol/L (ref 22–32)
Calcium: 7.5 mg/dL — ABNORMAL LOW (ref 8.9–10.3)
Chloride: 114 mmol/L — ABNORMAL HIGH (ref 101–111)
Creatinine, Ser: 1.56 mg/dL — ABNORMAL HIGH (ref 0.61–1.24)
GFR calc Af Amer: 58 mL/min — ABNORMAL LOW (ref >60)
GFR, EST NON AFRICAN AMERICAN: 50 mL/min — AB (ref >60)
Glucose, Bld: 134 mg/dL — ABNORMAL HIGH (ref 65–99)
POTASSIUM: 4 mmol/L (ref 3.5–5.1)
SODIUM: 155 mmol/L — AB (ref 135–145)

## 2016-02-22 LAB — CARBOXYHEMOGLOBIN
Carboxyhemoglobin: 1.2 % (ref 0.5–1.5)
Methemoglobin: 0.9 % (ref 0.0–1.5)
O2 SAT: 82.4 %
Total hemoglobin: 10.3 g/dL — ABNORMAL LOW (ref 13.5–18.0)

## 2016-02-22 LAB — CULTURE, BLOOD (ROUTINE X 2)
CULTURE: NO GROWTH
Culture: NO GROWTH

## 2016-02-22 LAB — FANA STAINING PATTERNS

## 2016-02-22 LAB — CBC
HCT: 31 % — ABNORMAL LOW (ref 39.0–52.0)
Hemoglobin: 10 g/dL — ABNORMAL LOW (ref 13.0–17.0)
MCH: 30.1 pg (ref 26.0–34.0)
MCHC: 32.3 g/dL (ref 30.0–36.0)
MCV: 93.4 fL (ref 78.0–100.0)
PLATELETS: 219 10*3/uL (ref 150–400)
RBC: 3.32 MIL/uL — AB (ref 4.22–5.81)
RDW: 14.9 % (ref 11.5–15.5)
WBC: 11.2 10*3/uL — AB (ref 4.0–10.5)

## 2016-02-22 LAB — GLUCOSE, CAPILLARY
GLUCOSE-CAPILLARY: 85 mg/dL (ref 65–99)
GLUCOSE-CAPILLARY: 100 mg/dL — AB (ref 65–99)
Glucose-Capillary: 113 mg/dL — ABNORMAL HIGH (ref 65–99)
Glucose-Capillary: 73 mg/dL (ref 65–99)
Glucose-Capillary: 85 mg/dL (ref 65–99)
Glucose-Capillary: 91 mg/dL (ref 65–99)

## 2016-02-22 LAB — C DIFFICILE QUICK SCREEN W PCR REFLEX
C DIFFICILE (CDIFF) TOXIN: NEGATIVE
C Diff antigen: NEGATIVE
C Diff interpretation: NEGATIVE

## 2016-02-22 LAB — ANTINUCLEAR ANTIBODIES, IFA: ANA Ab, IFA: POSITIVE — AB

## 2016-02-22 LAB — PHOSPHORUS: PHOSPHORUS: 2.5 mg/dL (ref 2.5–4.6)

## 2016-02-22 LAB — MAGNESIUM: Magnesium: 2.6 mg/dL — ABNORMAL HIGH (ref 1.7–2.4)

## 2016-02-22 MED ORDER — ISOSORB DINITRATE-HYDRALAZINE 20-37.5 MG PO TABS
1.0000 | ORAL_TABLET | Freq: Three times a day (TID) | ORAL | Status: DC
  Administered 2016-02-22 – 2016-02-24 (×9): 1 via ORAL
  Filled 2016-02-22 (×9): qty 1

## 2016-02-22 MED ORDER — PENICILLIN G BENZATHINE 1200000 UNIT/2ML IM SUSP
2.4000 10*6.[IU] | INTRAMUSCULAR | Status: AC
  Administered 2016-02-22 – 2016-03-07 (×3): 2.4 10*6.[IU] via INTRAMUSCULAR
  Filled 2016-02-22 (×4): qty 4

## 2016-02-22 NOTE — Progress Notes (Signed)
Wasted 40cc of versed in sink; witness by 2nd Charity fundraiserN. Chapman MossSamantha Rouse.

## 2016-02-22 NOTE — Progress Notes (Signed)
Patient ID: Dale [name], male   DOB: 05/23/1966, 50 y.o.   MRN: [id number]    SUBJECTIVE:  Yesterday dobutamine cut back to 1.5 mcg and lasix was stopped. Weight up 1 pound.   Scheduled Meds: . amiodarone  200 mg Oral BID  . antiseptic oral rinse  7 mL Mouth Rinse 10 times per day  . aspirin  81 mg Oral Daily  . cefTRIAXone (ROCEPHIN)  IV  2 g Intravenous Q12H  . chlorhexidine gluconate  15 mL Mouth Rinse BID  . folic acid  1 mg Per Tube Daily  . free water  400 mL Per Tube Q6H  . heparin subcutaneous  5,000 Units Subcutaneous 3 times per day  . insulin aspart  2-6 Units Subcutaneous 6 times per day  . insulin aspart  3 Units Subcutaneous 6 times per day  . insulin glargine  20 Units Subcutaneous Q24H  . isosorbide-hydrALAZINE  0.5 tablet Oral TID  . pantoprazole sodium  40 mg Per Tube Q24H  . sodium chloride flush  10-40 mL Intracatheter Q12H  . spironolactone  25 mg Per Tube Daily  . thiamine  100 mg Per Tube Daily   Continuous Infusions: . sodium chloride 250 mL (02/21/16 2000)  . sodium chloride 10 mL/hr at 02/21/16 2000  . cisatracurium (NIMBEX) infusion Stopped (02/21/16 0230)  . DOBUTamine 1.5 mcg/kg/min (02/21/16 2000)  . feeding supplement (VITAL AF 1.2 CAL) Stopped (02/22/16 0500)  . fentaNYL infusion INTRAVENOUS 50 mcg/hr (02/22/16 0517)  . midazolam (VERSED) infusion 1 mg/hr (02/22/16 0519)  . norepinephrine (LEVOPHED) Adult infusion Stopped (02/19/16 1257)   PRN Meds:.acetaminophen (TYLENOL) oral liquid 160 mg/5 mL, albuterol, fentaNYL, fentaNYL, fentaNYL (SUBLIMAZE) injection, midazolam, midazolam, midazolam, rocuronium, sodium chloride flush    Filed Vitals:   02/22/16 0400 02/22/16 0415 02/22/16 0500 02/22/16 0600  BP:  130/68    Pulse: 68 65 64 73  Temp: 97.2 F (36.2 C) 97.2 F (36.2 C) 97 F (36.1 C) 96.3 F (35.7 C)  TempSrc: Core (Comment)     Resp: 20 22 11 18   Height:      Weight:    143 lb 15.4 oz (65.3 kg)  SpO2: 98% 98% 97% 97%     Intake/Output Summary (Last 24 hours) at 02/22/16 0723 Last data filed at 02/22/16 0600  Gross per 24 hour  Intake 3034.86 ml  Output   2220 ml  Net 814.86 ml    LABS: Basic Metabolic Panel:  Recent Labs  78/29/5603/19/17 0445 02/22/16 0300  NA 153* 155*  K 4.0 4.0  CL 114* 114*  CO2 31 31  GLUCOSE 140* 134*  BUN 44* 46*  CREATININE 1.58* 1.56*  CALCIUM 7.4* 7.5*  MG 2.5* 2.6*  PHOS 2.8 2.5   Liver Function Tests:  Recent Labs  02/20/16 0400  AST 86*  ALT 79*  ALKPHOS 45  BILITOT 0.4  PROT 5.1*  ALBUMIN 1.6*   No results for input(s): LIPASE, AMYLASE in the last 72 hours. CBC:  Recent Labs  02/21/16 0350 02/22/16 0300  WBC 11.9* 11.2*  HGB 9.7* 10.0*  HCT 29.7* 31.0*  MCV 91.1 93.4  PLT 191 219   Cardiac Enzymes: No results for input(s): CKTOTAL, CKMB, CKMBINDEX, TROPONINI in the last 72 hours. BNP: Invalid input(s): POCBNP D-Dimer: No results for input(s): DDIMER in the last 72 hours. Hemoglobin A1C: No results for input(s): HGBA1C in the last 72 hours. Fasting Lipid Panel: No results for input(s): CHOL, HDL, LDLCALC, TRIG, CHOLHDL, LDLDIRECT in the  last 72 hours. Thyroid Function Tests: No results for input(s): TSH, T4TOTAL, T3FREE, THYROIDAB in the last 72 hours.  Invalid input(s): FREET3 Anemia Panel: No results for input(s): VITAMINB12, FOLATE, FERRITIN, TIBC, IRON, RETICCTPCT in the last 72 hours.  RADIOLOGY: Dg Chest 2 View  02/13/2016  CLINICAL DATA:  50 year old male with history of anterior chest pain for the past 2 days. Low-grade fever. Dizziness. EXAM: CHEST  2 VIEW COMPARISON:  Chest x-ray 06/01/2009. FINDINGS: Lung volumes are normal. No consolidative airspace disease. No pleural effusions. No pneumothorax. No pulmonary nodule or mass noted. Pulmonary vasculature and the cardiomediastinal silhouette are within normal limits. IMPRESSION: No radiographic evidence of acute cardiopulmonary disease. Electronically Signed   By: Trudie Reed M.D.   On: 02/13/2016 17:57   Dg Thoracic Spine 2 View  02/13/2016  CLINICAL DATA:  O thoracic and lower back pain for 1 week. Fall 2 days ago. Pain was before the fall. EXAM: THORACIC SPINE 2 VIEWS COMPARISON:  Chest x-ray today and 06/01/2009 FINDINGS: There is no evidence of thoracic spine fracture. Alignment is normal. No other significant bone abnormalities are identified. IMPRESSION: Negative. Electronically Signed   By: Charlett Nose M.D.   On: 02/13/2016 20:45   Dg Lumbar Spine Complete  02/13/2016  CLINICAL DATA:  Low back pain and recent fall. EXAM: LUMBAR SPINE - COMPLETE 4+ VIEW COMPARISON:  08/25/2014 lumbar spine radiographs. FINDINGS: This report assumes 5 non rib-bearing lumbar vertebrae. Lumbar vertebral body heights are preserved, with no fracture. There is mild loss of disc height at L4-5 with associated minimal spondylosis, not appreciably changed. Otherwise preserved lumbar disc spaces. There is new 3 mm anterolisthesis at L4-5. No appreciable facet arthropathy. No appreciable foraminal stenosis. No aggressive appearing focal osseous lesions. IMPRESSION: 1. No lumbar spine fracture. 2. Stable mild degenerative disc disease at L4-5. 3. New mild 3 mm anterolisthesis at L4-5. Electronically Signed   By: Delbert Phenix M.D.   On: 02/13/2016 20:47   Ct Head Wo Contrast  02/16/2016  CLINICAL DATA:  Fever and altered mental status; tremors EXAM: CT HEAD WITHOUT CONTRAST TECHNIQUE: Contiguous axial images were obtained from the base of the skull through the vertex without intravenous contrast. COMPARISON:  June 01, 2009 FINDINGS: The ventricles are mildly enlarged in a generalized manner and appear larger compared to the prior study. The sulci appear unremarkable. There is no intracranial mass, hemorrhage, extra-axial fluid collection, or midline shift. No focal gray-white compartment lesions are identified. No evidence of acute infarct. Bony calvarium appears intact. The mastoid air  cells are clear. There is opacification in the left maxillary antrum as well as in multiple ethmoid air cells bilaterally. There is mild mucosal thickening in the left sphenoid sinus region. No intraorbital lesions are apparent. IMPRESSION: Ventricles mildly enlarged with sulci appearing normal. This appearance potentially could represent a degree of normal pressure hydrocephalus. This finding also could be secondary to prior or recent meningitis meningitis. No intracranial mass, hemorrhage, or extra-axial fluid collection. No acute infarct evident. Multifocal sinusitis. Given these current findings, brain MRI pre and post-contrast may be helpful to further assess, in particular to further evaluate the meningeal regions. Electronically Signed   By: Bretta Bang III M.D.   On: 02/16/2016 14:04   Dg Chest Port 1 View  02/22/2016  CLINICAL DATA:  Respiratory failure. EXAM: PORTABLE CHEST 1 VIEW COMPARISON:  02/21/2016. FINDINGS: Endotracheal tube, NG tube, right IJ sheath in stable position. Interim removal Swan-Ganz catheter. Heart size stable. Low lung volumes  with mild bibasilar atelectasis and or infiltrates. Interim improvement from prior exam. Small left pleural effusion cannot be excluded. No pneumothorax. IMPRESSION: 1. Level of Swan-Ganz catheter. Endotracheal tube, NG tube, right IJ sheath in stable position. 2. Low lung volumes with mild bibasilar atelectasis and/or infiltrates. Interim improvement from prior exam. Small left pleural effusion cannot be excluded. Electronically Signed   By: Maisie Fus  Register   On: 02/22/2016 07:13   Dg Chest Port 1 View  02/21/2016  CLINICAL DATA:  Respiratory failure EXAM: PORTABLE CHEST 1 VIEW COMPARISON:  Chest radiograph from one day prior. FINDINGS: Right internal jugular Swan-Ganz catheter terminates in the right infrahilar region. Enteric tube enters the stomach with the tip not seen on this image. Endotracheal tube tip is 2.5 cm above the carina. Stable  cardiomediastinal silhouette with normal heart size. No pneumothorax. Stable small left pleural effusion. No pulmonary edema. Stable left lower lobe consolidation. IMPRESSION: 1. Well-positioned endotracheal tube. Right internal jugular Swan-Ganz catheter terminates over the right infrahilar region, consider retracting 2-3 cm. 2. Stable small left pleural effusion. 3. Stable left lower lobe consolidation, favor atelectasis. Electronically Signed   By: Delbert Phenix M.D.   On: 02/21/2016 08:00   Dg Chest Port 1 View  02/20/2016  CLINICAL DATA:  CHF EXAM: PORTABLE CHEST 1 VIEW COMPARISON:  Chest radiograph from one day prior. FINDINGS: Right internal jugular Swan-Ganz catheter terminates over the central right lower lung, unchanged. Endotracheal tube tip is 2.4 cm above the carina. Enteric tube enters the stomach with the tip not seen on this image. Intra-aortic balloon pump marker terminates over the proximal descending thoracic aorta at the T7-8 level. Stable cardiomediastinal silhouette with normal heart size. No pneumothorax. No pleural effusion. Hazy opacities throughout the bilateral parahilar lungs are not appreciably changed. Probable complete left lower lobe atelectasis, unchanged. IMPRESSION: 1. Right internal jugular Swan-Ganz catheter terminates over the central lower right lung, consider retracting 3-4 cm . 2. Otherwise well-positioned support structures as described. 3. Stable hazy opacities throughout the parahilar lungs, favor pulmonary edema. Electronically Signed   By: Delbert Phenix M.D.   On: 02/20/2016 09:34   Dg Chest Port 1 View  02/19/2016  CLINICAL DATA:  CHF EXAM: PORTABLE CHEST 1 VIEW COMPARISON:  02/18/2016 FINDINGS: Support devices remain in place, unchanged. Heart is normal size. Diffuse interstitial and alveolar opacities are again noted, likely edema. This is somewhat more confluent in both lung bases, left greater than right. Cannot completely exclude infiltrate at the left base. No  visible effusions. No acute bony abnormality. IMPRESSION: Continued diffuse interstitial and alveolar opacities, likely edema. Somewhat more focal opacities in the lung bases, left greater than right. Cannot exclude pneumonia. Electronically Signed   By: Charlett Nose M.D.   On: 02/19/2016 08:29   Dg Chest Port 1 View  02/18/2016  CLINICAL DATA:  Entry aortic balloon pump asist. EXAM: PORTABLE CHEST 1 VIEW COMPARISON:  February 17, 2016. FINDINGS: The heart size and mediastinal contours are within normal limits. No pneumothorax or pleural effusion is noted. Endotracheal nasogastric tubes are unchanged in position. Right internal jugular Swan-Ganz catheter is unchanged with distal tip in expected position of right pulmonary artery. Distal tip of aortic balloon catheter is seen over expected position of superior portion of descending thoracic aorta just below aortic knob. Stable bilateral perihilar opacities are noted concerning for edema or inflammation. The visualized skeletal structures are unremarkable. IMPRESSION: Distal tip of aortic balloon catheter appears to be slightly more inferior in position compared to  prior exam, just below aortic knob currently. This projects over the expected position of the superior aspect of descending thoracic aorta. Stable bilateral perihilar opacities are noted concerning for edema or inflammation. Otherwise stable support apparatus. Electronically Signed   By: Lupita Raider, M.D.   On: 02/18/2016 07:24   Dg Chest Port 1 View  02/17/2016  CLINICAL DATA:  Ventilator dependence. EXAM: PORTABLE CHEST 1 VIEW COMPARISON:  Earlier the same day. FINDINGS: 1822 hours. Endotracheal tube tip is 7.4 cm above the base of the carina. Right IJ pulmonary artery catheter tip is in the interlobar pulmonary artery. The tip of the intra-aortic balloon pump projects over the inferior aspect of the transverse aorta. The NG tube passes into the stomach although the distal tip position is not  included on the film. Proximal port of the NG tube is at or just above the EG junction. Diffuse interstitial and central alveolar opacities suggest edema. Telemetry leads overlie the chest. IMPRESSION: The tip of the intra-aortic balloon pump projects over the inferior aspect of the transverse aorta. Electronically Signed   By: Kennith Center M.D.   On: 02/17/2016 18:34   Dg Chest Port 1 View  02/17/2016  CLINICAL DATA:  50 year old male with a history of Swan-Ganz catheter placement. EXAM: PORTABLE CHEST 1 VIEW COMPARISON:  02/17/2016 FINDINGS: Cardiomediastinal silhouette unchanged in size and contour. Skull pads again project over the thorax. Unchanged endotracheal tube, gastric tube. Interval placement of right IJ sheath, through which a Swan-Ganz catheter has been placed terminating in the right pulmonary arteries. Persisting bilateral airspace opacity IMPRESSION: Interval placement of right IJ sheath, through which a Swan-Ganz catheter has been placed and terminates in the right pulmonary arteries at the hilum. Similar appearance of bilateral airspace opacities, potentially infection and/or edema. Unchanged support apparatus as above. Signed, Yvone Neu. Loreta Ave, DO [facility] Electronically Signed   By: Gilmer Mor D.O.   On: 02/17/2016 13:53   Dg Chest Port 1 View  02/17/2016  CLINICAL DATA:  Hypoxia EXAM: PORTABLE CHEST 1 VIEW COMPARISON:  February 16, 2016 FINDINGS: Endotracheal tube tip is 3.5 cm above the carina. Nasogastric tube tip and side port are in the stomach. No pneumothorax evident. There is interstitial edema throughout the lungs bilaterally, perhaps slightly increased from 1 day prior. No airspace consolidation. Heart is upper normal in size with pulmonary vascularity within normal limits. No adenopathy. IMPRESSION: Tube positions as described without pneumothorax. Slight increase in interstitial edema. No airspace consolidation. No  change in cardiac silhouette. Electronically Signed   By: Bretta Bang III M.D.   On: 02/17/2016 07:31   Dg Chest Portable 1 View  02/16/2016  CLINICAL DATA:  Endotracheal tube placement EXAM: PORTABLE CHEST 1 VIEW COMPARISON:  Chest x-ray from earlier same day. FINDINGS: Endotracheal tube well positioned with tip approximately 2 cm above the carina. Enteric tube passes below the diaphragm. Heart size is normal. There is central pulmonary vascular congestion and mild interstitial edema. Suspect small left pleural effusion. No pneumothorax. Osseous structures about the chest are unremarkable. IMPRESSION: 1. Endotracheal tube well positioned with tip approximately 2 cm above the carina. 2. Central pulmonary vascular congestion and mild bilateral interstitial edema suggesting mild volume overload/resuscitation efforts. No frank alveolar pulmonary edema. Electronically Signed   By: Bary Richard M.D.   On: 02/16/2016 19:38   Dg Chest Port 1 View  02/16/2016  CLINICAL DATA:  Altered mental status.  Depression. EXAM: PORTABLE CHEST 1 VIEW COMPARISON:  02/13/2016. FINDINGS: Midline trachea. Normal heart size and mediastinal contours. Left costophrenic angle exclusion. No pleural effusion or pneumothorax. Clear lungs. IMPRESSION: No active disease. Electronically Signed   By: Jeronimo Greaves M.D.   On: 02/16/2016 13:48   Ct Portable Head W/o Cm  02/17/2016  CLINICAL DATA:  50 year old male with a history of hydrocephalus. Acute meningitis. EXAM: CT HEAD WITHOUT CONTRAST TECHNIQUE: Contiguous axial images were obtained from the base of the skull through the vertex without intravenous contrast. COMPARISON:  02/16/2016 FINDINGS: Unremarkable appearance of the calvarium without acute fracture or aggressive lesion. Unremarkable appearance of the scalp soft tissues. Unremarkable appearance of the bilateral orbits. Partially visualized endotracheal tube.  Oral enteric tube. Near complete opacification of the left  maxillary sinus with frothy secretions. Trace secretions of the right maxillary sinus. Mucoperiosteal thickening of the ethmoid air cells and the sphenoid sinuses. Trace disease of the frontal sinuses. Mastoid air cells are clear. Layered fluid within the nasopharynx. Configuration the ventricles is similar to comparison with bifrontal diameter measuring 40 mm, unchanged from prior. Unchanged appearance of the hemispheric sulci. No acute intracranial hemorrhage.  No midline shift. Gray-white differentiation relatively maintained. IMPRESSION: Unchanged ventricular dilation and appearance of the cerebral sulci, with no evidence of acute hemorrhage or infarction. Signed, Yvone Neu. Loreta Ave, DO [facility] Electronically Signed   By: Gilmer Mor D.O.   On: 02/17/2016 07:23    PHYSICAL EXAM General: Intubated/sedated.  Neck: JVP not elevated, no thyromegaly or thyroid nodule. RIJ  Lungs: Crackles dependently bilaterally.  CV: Nondisplaced PMI.  Heart regular S1/S2, no S3, no murmur.  No peripheral edema.  Abdomen: Soft, no hepatosplenomegaly, no distention.  Neurologic: Intubated/sedated.  Extremities: No clubbing or cyanosis.  GU: Foley.   TELEMETRY: Reviewed telemetry pt in NSR in 90s  ASSESSMENT AND PLAN: 50 yo with minimal past medical history was admitted with confusion, prior URI symptoms.  Had LP concerning for meningitis and found to have strep bacteremia/meningitis.  Later developed shock requiring pressors and was intubated for airway protection.  Bedside echo showed EF about 10%.  Swan placed, noted cardiogenic shock and had IABP.   1. Shock: Primarily appears to have cardiogenic shock.  Cardiac output much improved, IABP out 3/18.     - Today CO-OX is 82%. Stop dobutamine.   2. Acute hypoxemic respiratory failure: CXR looked like pulmonary edema.  Oxygen requirement has decreased with diuresis.   3. ID: Strep bacteremia with  meningitis: On ceftriaxone. WBCs coming down.  Completed  with 4 days of dexamethasone. 4. AKI: Creatinine stable.  5. Acute systolic CHF: EF 16-10% on 3/16 echo.  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   - Continue spironolactone, increase Bidil to 1 tab tid. 6. Elevated LFTs: Suspect shock liver. LFTs improving.  7. Cardiac arrest: PEA arrest then ventricular fibrillation in ER.  Now on po amiodarone in NSR.  8. Neuro: Weaning sedation to see if he will awaken.  9. Hypernatremia: Increase free water boluses.   Amy Clegg NP-C  02/22/2016 7:23 AM  Patient seen with NP, agree with the above note.  He was off sedation yesterday, did not wake up. Will stop dobutamine today and increase Bidil.  CVP 6 overnight, will hold off on Lasix.   Marca Ancona 02/22/2016 7:38 AM

## 2016-02-22 NOTE — Progress Notes (Signed)
MD aware of pt's low body temperature. Orders to apply bear hugger.  Will continue to monitor closely and update as needed.

## 2016-02-22 NOTE — Progress Notes (Signed)
Arterial line change overdue (3/19).  RT unable to complete full dressing change due to femoral site.  Pressure bag/line changed down to first accessible hub.  Site care will need to be addressed by MD/access team.

## 2016-02-22 NOTE — Progress Notes (Signed)
PULMONARY / CRITICAL CARE MEDICINE   Name: Dale [name] MRN: [id number] DOB: 07/13/1966    ADMISSION DATE:  02/16/2016   CONSULTATION DATE:  02/16/16  REFERRING MD:  Rito EhrlichKrishnan  CHIEF COMPLAINT:  AMS   STUDIES:  3/14 CT head > multifocal sinusitis, enlarged ventricles 3/14 LP >> glucose < 20, protein > 600, WBC 340 (88%N), 89 RBC  CULTURES: 3/14 Blood > strep pneumo 3/14 Urine >neg 3/14 CSF > strep pneumo   ANTIBIOTICS: 3/14 Ampicillin >> 3/14 3/14 Vancomycin >>off 3/14 Rocephin >>  LINES/TUBES: 3/14 ETT >> 3/15 Rt femoral CVL >> 3/15 Rt femoral aline >> 3/15 Rt IJ introducer >>     SIGNIFICANT EVENTS: 3/14 Admit, VDRF, ID/cardiology/neurology consulted 3/15 Cardiac arrest, 5 mins, Asystole > VF > ST; ARDS, septic/cardiogenic shock >> IABP; start paralytic 3/16 - Remains on paralytic, pressors, inotropes. 3/18 - IABP to be removed 3/18 stopping nimbex   SUBJECTIVE/OVERNIGHT/INTERVAL HX IABP removed, pressors weaning to off Nimbex stopped and well tolerated Sedation holiday this am, has not interacted yet so gtt's still off  VITAL SIGNS: BP 169/84 mmHg  Pulse 68  Temp(Src) 90.3 F (32.4 C) (Core (Comment))  Resp 17  Ht 5' 8.9" (1.75 m)  Wt 65.3 kg (143 lb 15.4 oz)  BMI 21.32 kg/m2  SpO2 99%  HEMODYNAMICS: CVP:  [3 mmHg-11 mmHg] 3 mmHg  VENTILATOR SETTINGS: Vent Mode:  [-] PRVC FiO2 (%):  [40 %-50 %] 40 % Set Rate:  [20 bmp] 20 bmp Vt Set:  [410 mL] 410 mL PEEP:  [5 cmH20] 5 cmH20 Plateau Pressure:  [8 cmH20-15 cmH20] 13 cmH20  INTAKE / OUTPUT: I/O last 3 completed shifts: In: 4158.4 [I.V.:1398.4; NG/GT:2660; IV Piggyback:100] Out: 4725 [Urine:4725]  PHYSICAL EXAMINATION: General: Paralytics off, not responsive Neuro: Off sedation, unresponsive, does not withdraw to pain. HEENT: [place]/AT, PERRL, EOM-spontaneous and not withdrawing to pain. Cardiac: RRR, Nl S1/S2, -M/R/G. Chest: CTA bilaterally. Abd: Soft, NT, ND and +BS. Ext: Intact. Skin:  no rashes  LABS:  PULMONARY  Recent Labs Lab 02/18/16 1004  02/18/16 1213 02/19/16 0400  02/20/16 0405 02/20/16 0425 02/21/16 0349 02/21/16 0725 02/22/16 0320  PHART 7.493*  --  7.481* 7.553*  --  7.420  --  7.449  --   --   PCO2ART 29.8*  --  31.5* 28.6*  --  45.2*  --  46.7*  --   --   PO2ART 57.0*  --  63.0* 101*  --  84.7  --  89.3  --   --   HCO3 23.2  --  23.9 25.4*  --  28.8*  --  31.9*  --   --   TCO2 24  --  25 26.3  --  30.2  --  33.4  --   --   O2SAT 94.0  < > 95.0 97.8  < > 94.8 73.0 96.7 82.1 82.4  < > = values in this interval not displayed.  CBC  Recent Labs Lab 02/20/16 0400 02/21/16 0350 02/22/16 0300  HGB 9.6* 9.7* 10.0*  HCT 27.7* 29.7* 31.0*  WBC 14.4* 11.9* 11.2*  PLT 168 191 219   COAGULATION  Recent Labs Lab 02/17/16 0053 02/21/16 0350  INR 1.14 1.13   CARDIAC  Recent Labs Lab 02/16/16 2016 02/17/16 0033 02/17/16 0500 02/17/16 0845  TROPONINI 7.59* 13.02* 6.43* 4.74*   No results for input(s): PROBNP in the last 168 hours.  CHEMISTRY  Recent Labs Lab 02/18/16 0500 02/18/16 1550 02/19/16 0400 02/20/16 0400 02/21/16 0445 02/22/16  0300  NA 134* 138 142 147* 153* 155*  K 3.1* 3.6 3.4* 3.8 4.0 4.0  CL 102 101 105 109 114* 114*  CO2 18* GLUCOSE 208* 148* 116* 131* 140* 134*  BUN 28* 28* 30* 37* 44* 46*  CREATININE 1.83* 1.79* 1.86* 1.59* 1.58* 1.56*  CALCIUM 6.6* 6.8* 7.0* 7.1* 7.4* 7.5*  MG 2.0  --  2.0 2.3 2.5* 2.6*  PHOS 1.7*  --  1.7* 2.9 2.8 2.5   Estimated Creatinine Clearance: 51.7 mL/min (by C-G formula based on Cr of 1.56).  LIVER  Recent Labs Lab 02/16/16 1200 02/17/16 0033 02/17/16 0053 02/18/16 0500 02/19/16 0400 02/20/16 0400 02/21/16 0350  AST 117* 174*  --  515* 142* 86*  --   ALT 42 45  --  100* 83* 79*  --   ALKPHOS 56 39  --  42 62 45  --   BILITOT 1.1 1.7*  --  0.3 0.3 0.4  --   PROT 7.5 5.0*  --  4.9* 5.0* 5.1*  --   ALBUMIN 2.9* 1.7*  --  1.5* 1.5* 1.6*  --   INR  --    --  1.14  --   --   --  1.13   INFECTIOUS  Recent Labs Lab 02/16/16 2006 02/17/16 0030 02/17/16 0053 02/20/16 1630  LATICACIDVEN 1.5 4.9*  --  1.1  PROCALCITON  --   --  73.53  --    ENDOCRINE CBG (last 3)   Recent Labs  02/21/16 2134 02/21/16 2331 02/22/16 0424  GLUCAP 144* 145* 113*   IMAGING x48h Dg Chest Port 1 View  02/22/2016  CLINICAL DATA:  Respiratory failure. EXAM: PORTABLE CHEST 1 VIEW COMPARISON:  02/21/2016. FINDINGS: Endotracheal tube, NG tube, right IJ sheath in stable position. Interim removal Swan-Ganz catheter. Heart size stable. Low lung volumes with mild bibasilar atelectasis and or infiltrates. Interim improvement from prior exam. Small left pleural effusion cannot be excluded. No pneumothorax. IMPRESSION: 1. Level of Swan-Ganz catheter. Endotracheal tube, NG tube, right IJ sheath in stable position. 2. Low lung volumes with mild bibasilar atelectasis and/or infiltrates. Interim improvement from prior exam. Small left pleural effusion cannot be excluded. Electronically Signed   By: Maisie Fus  Register   On: 02/22/2016 07:13   Dg Chest Port 1 View  02/21/2016  CLINICAL DATA:  Respiratory failure EXAM: PORTABLE CHEST 1 VIEW COMPARISON:  Chest radiograph from one day prior. FINDINGS: Right internal jugular Swan-Ganz catheter terminates in the right infrahilar region. Enteric tube enters the stomach with the tip not seen on this image. Endotracheal tube tip is 2.5 cm above the carina. Stable cardiomediastinal silhouette with normal heart size. No pneumothorax. Stable small left pleural effusion. No pulmonary edema. Stable left lower lobe consolidation. IMPRESSION: 1. Well-positioned endotracheal tube. Right internal jugular Swan-Ganz catheter terminates over the right infrahilar region, consider retracting 2-3 cm. 2. Stable small left pleural effusion. 3. Stable left lower lobe consolidation, favor atelectasis. Electronically Signed   By: Delbert Phenix M.D.   On: 02/21/2016  08:00   50 yo homeless male smoker with hx of ETOH developed upper respiratory symptoms.  Developed progressive confusion, lactic acidosis, AKI. 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  Suffered cardiac arrest, cardiogenic shock with acute systolic CHF, ARDS 3/15.  ASSESSMENT / PLAN:  PULMONARY A:  Acute respiratory failure 2nd to ARDS, acute pulmonary edema. P:   Wean PEEP, FiO2 per ARDS protocol 6 cc/kg for Vt F/u CXR PS trials as  able.  CARDIOVASCULAR A:  Septic/cardiogenic shock >> mostly cardiogenic now. Acute systolic CHF. Cardiac arrest 3/15. NSTEMI. P:  Hold diureses for now. Cards medications as needed. IABP out.  RENAL Lab Results  Component Value Date   CREATININE 1.56* 02/22/2016   CREATININE 1.58* 02/21/2016   CREATININE 1.59* 02/20/2016   Recent Labs Lab 02/20/16 0400 02/21/16 0445 02/22/16 0300  K 3.8 4.0 4.0   A:   AKI 2nd to ATN. Lactic acidosis. Hypokalemia. - repleted P:   Follow BMP Replace electrolytes as indicated Free water 250 ml q6 hours.  GASTROINTESTINAL A:   Nutrition. Shock liver. P:   Tube feeds Protonix F/u LFTs  HEMATOLOGIC A:   Leukocytosis. P:  F/u CBC  INFECTIOUS A:   Streptococcal bacteremia and meningitis. RPR positive. P:   Day 7 ceftriaxone, decadron per ID Vancomycin off 3/18   ENDOCRINE CBG (last 3)   Recent Labs  02/21/16 2134 02/21/16 2331 02/22/16 0424  GLUCAP 144* 145* 113*   A:   Hyperglycemia. Relative adrenal insufficiency >> cortisol 15.2 from 3/15. P:   Insulin gtt. Decadron.  NEUROLOGIC A:   Acute metabolic encephalopathy. Hx of ETOH, THC.  P:   Thiamine, folic acid RASS goal -1 Off sedation currently, follow MS. Repeat imaging if he isn't starting to clear MS  AUTOIMMUNE A - no prior hx of autoimmune diz P - Following for anca, ana, ds-dna results  FAMILY - Family updated bedside, they are discussing trach/peg.  The patient is critically ill with multiple organ  systems failure and requires high complexity decision making for assessment and support, frequent evaluation and titration of therapies, application of advanced monitoring technologies and extensive interpretation of multiple databases.   Critical Care Time devoted to patient care services described in this note is  35  Minutes. This time reflects time of care of this signee Dr Koren Bound. This critical care time does not reflect procedure time, or teaching time or supervisory time of PA/NP/Med student/Med Resident etc but could involve care discussion time.  [name], M.D. [facility] Pulmonary/Critical Care Medicine. Pager: [phone]. After hours pager: [phone].

## 2016-02-22 NOTE — Progress Notes (Signed)
[facility]   Reason for visit: Follow up on Strep pneumononia  Interval History: remains intubated, off balloon pump, off swan, Strep pneumonia sensitivities noted, dexamethasone completed.  Had 3 loose stools on tube feeds and C diff ordered.    Physical Exam: Constitutional:  Filed Vitals:   02/22/16 0500 02/22/16 0600  BP:    Pulse: 64 73  Temp: 97 F (36.1 C) 96.3 F (35.7 C)  Resp: 11 18   intubated, sedated Eyes: anicteric, pinpoint pupils HENT: intubated Respiratory: Vented Cardiovascular: RRR GI: soft, nt, nd  Review of Systems: Unable to be assessed due to mental status  Lab Results  Component Value Date   WBC 11.2* 02/22/2016   HGB 10.0* 02/22/2016   HCT 31.0* 02/22/2016   MCV 93.4 02/22/2016   PLT 219 02/22/2016    Lab Results  Component Value Date   CREATININE 1.56* 02/22/2016   BUN 46* 02/22/2016   NA 155* 02/22/2016   K 4.0 02/22/2016   CL 114* 02/22/2016   CO2 31 02/22/2016    Lab Results  Component Value Date   ALT 79* 02/20/2016   AST 86* 02/20/2016   ALKPHOS 45 02/20/2016     Microbiology: Recent Results (from the past 240 hour(s))  Urine culture     Status: None   Collection Time: 02/16/16 11:40 AM  Result Value Ref Range Status   Specimen Description URINE, CATHETERIZED  Final   Special Requests NONE  Final   Culture NO GROWTH 1 DAY  Final   Report Status 02/17/2016 FINAL  Final  Blood Culture (routine x 2)     Status: None   Collection Time: 02/16/16 11:53 AM  Result Value Ref Range Status   Specimen Description BLOOD RIGHT ANTECUBITAL  Final   Special Requests BOTTLES DRAWN AEROBIC AND ANAEROBIC 5CC  Final   Culture  Setup Time   Final    GRAM POSITIVE COCCI IN CHAINS IN PAIRS IN BOTH AEROBIC AND ANAEROBIC BOTTLES CRITICAL RESULT CALLED TO, READ BACK BY AND VERIFIED WITH: B WOODSON,RN  02/17/16 MKELLY    Culture STREPTOCOCCUS PNEUMONIAE  Final   Report Status 02/19/2016 FINAL  Final   Organism ID, Bacteria STREPTOCOCCUS PNEUMONIAE  Final      Susceptibility   Streptococcus pneumoniae - MIC*    ERYTHROMYCIN >=8 RESISTANT Resistant     LEVOFLOXACIN 0.5 SENSITIVE Sensitive     VANCOMYCIN 0.5 SENSITIVE Sensitive     PENICILLIN 0.25 SENSITIVE Sensitive     CEFTRIAXONE 0.25 SENSITIVE Sensitive     * STREPTOCOCCUS PNEUMONIAE  Blood Culture (routine x 2)     Status: None   Collection Time: 02/16/16 12:00 PM  Result Value Ref Range Status   Specimen Description BLOOD RIGHT HAND  Final   Special Requests BOTTLES DRAWN AEROBIC AND ANAEROBIC 5CC  Final   Culture  Setup Time   Final    GRAM POSITIVE COCCI IN CHAINS IN PAIRS IN BOTH AEROBIC AND ANAEROBIC BOTTLES CRITICAL RESULT CALLED TO, READ BACK BY AND VERIFIED WITH: B WOODSON,RN  02/17/16 MKELLY    Culture   Final    STREPTOCOCCUS PNEUMONIAE SUSCEPTIBILITIES PERFORMED ON PREVIOUS CULTURE WITHIN THE LAST 5 DAYS.    Report Status 02/19/2016 FINAL  Final  CSF culture with Stat gram stain     Status: None   Collection Time: 02/16/16  4:44 PM  Result Value Ref Range Status   Specimen Description CSF  Final   Special Requests NONE  Final  Gram Stain   Final    WBC PRESENT,BOTH PMN AND MONONUCLEAR GRAM POSITIVE COCCI IN PAIRS Gram Stain Report Called to,Read Back By and Verified With: N Zonia KiefSTEPHENS RN 1806 02/16/16 A BROWNING CYTOSPIN    Culture FEW STREPTOCOCCUS PNEUMONIAE  Final   Report Status 02/19/2016 FINAL  Final   Organism ID, Bacteria STREPTOCOCCUS PNEUMONIAE  Final      Susceptibility   Streptococcus pneumoniae - MIC*    ERYTHROMYCIN >=8 RESISTANT Resistant     LEVOFLOXACIN 0.5 SENSITIVE Sensitive     VANCOMYCIN 0.5 SENSITIVE Sensitive     PENICILLIN 0.25 RESISTANT Resistant     CEFTRIAXONE 0.25 SENSITIVE Sensitive     * FEW STREPTOCOCCUS PNEUMONIAE  MRSA PCR Screening     Status: None   Collection Time: 02/16/16 11:11 PM  Result Value Ref Range Status   MRSA by PCR NEGATIVE NEGATIVE Final    Comment:         The GeneXpert MRSA Assay (FDA approved for NASAL specimens only), is one component of a comprehensive MRSA colonization surveillance program. It is not intended to diagnose MRSA infection nor to guide or monitor treatment for MRSA infections.   Culture, blood (x 2)     Status: None (Preliminary result)   Collection Time: 02/17/16  1:00 AM  Result Value Ref Range Status   Specimen Description BLOOD RIGHT ANTECUBITAL  Final   Special Requests   Final    BOTTLES DRAWN AEROBIC AND ANAEROBIC 4CC BLUE,5CC RED   Culture NO GROWTH 4 DAYS  Final   Report Status PENDING  Incomplete  Culture, blood (x 2)     Status: None (Preliminary result)   Collection Time: 02/17/16  1:00 AM  Result Value Ref Range Status   Specimen Description BLOOD A-LINE  Final   Special Requests BOTTLES DRAWN AEROBIC AND ANAEROBIC 5CC   Final   Culture NO GROWTH 4 DAYS  Final   Report Status PENDING  Incomplete    Impression/Plan:  1. Strep pneumonia meningitis - stopped vancomycin due to sensitivities.  Dexa for 4 days done.   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  Will give one dose, will need treatment x 2 1 weeks apart prior to discharge with 2.4 million units Bicillin Added VDRL to CSF but not available.  Will readd  3.  Loose stools - 3 x and getting tube feeds.  Not c/w C diff.  Will stop test and d/c isolation.   I will follow up intermittently as needed.

## 2016-02-23 ENCOUNTER — Inpatient Hospital Stay (HOSPITAL_COMMUNITY): Payer: Medicaid Other

## 2016-02-23 DIAGNOSIS — R4182 Altered mental status, unspecified: Secondary | ICD-10-CM

## 2016-02-23 LAB — GLUCOSE, CAPILLARY
GLUCOSE-CAPILLARY: 105 mg/dL — AB (ref 65–99)
GLUCOSE-CAPILLARY: 111 mg/dL — AB (ref 65–99)
GLUCOSE-CAPILLARY: 128 mg/dL — AB (ref 65–99)
GLUCOSE-CAPILLARY: 90 mg/dL (ref 65–99)
GLUCOSE-CAPILLARY: 91 mg/dL (ref 65–99)

## 2016-02-23 LAB — CBC
HEMATOCRIT: 33.4 % — AB (ref 39.0–52.0)
HEMOGLOBIN: 10.7 g/dL — AB (ref 13.0–17.0)
MCH: 29.6 pg (ref 26.0–34.0)
MCHC: 32 g/dL (ref 30.0–36.0)
MCV: 92.5 fL (ref 78.0–100.0)
Platelets: 281 10*3/uL (ref 150–400)
RBC: 3.61 MIL/uL — AB (ref 4.22–5.81)
RDW: 14.8 % (ref 11.5–15.5)
WBC: 13.3 10*3/uL — ABNORMAL HIGH (ref 4.0–10.5)

## 2016-02-23 LAB — BLOOD GAS, ARTERIAL
ACID-BASE EXCESS: 7.2 mmol/L — AB (ref 0.0–2.0)
BICARBONATE: 30.3 meq/L — AB (ref 20.0–24.0)
Drawn by: 449561
FIO2: 0.4
MECHVT: 410 mL
O2 SAT: 98.5 %
PCO2 ART: 36.3 mmHg (ref 35.0–45.0)
PEEP: 5 cmH2O
PH ART: 7.531 — AB (ref 7.350–7.450)
Patient temperature: 98.6
RATE: 20 resp/min
TCO2: 31.4 mmol/L (ref 0–100)
pO2, Arterial: 132 mmHg — ABNORMAL HIGH (ref 80.0–100.0)

## 2016-02-23 LAB — BASIC METABOLIC PANEL
ANION GAP: 15 (ref 5–15)
BUN: 45 mg/dL — ABNORMAL HIGH (ref 6–20)
CHLORIDE: 112 mmol/L — AB (ref 101–111)
CO2: 30 mmol/L (ref 22–32)
CREATININE: 1.58 mg/dL — AB (ref 0.61–1.24)
Calcium: 7.8 mg/dL — ABNORMAL LOW (ref 8.9–10.3)
GFR calc non Af Amer: 49 mL/min — ABNORMAL LOW (ref >60)
GFR, EST AFRICAN AMERICAN: 57 mL/min — AB (ref >60)
Glucose, Bld: 109 mg/dL — ABNORMAL HIGH (ref 65–99)
Potassium: 3.3 mmol/L — ABNORMAL LOW (ref 3.5–5.1)
SODIUM: 157 mmol/L — AB (ref 135–145)

## 2016-02-23 LAB — PHOSPHORUS: Phosphorus: 3 mg/dL (ref 2.5–4.6)

## 2016-02-23 LAB — VDRL, CSF: VDRL Quant, CSF: NONREACTIVE

## 2016-02-23 LAB — MAGNESIUM: MAGNESIUM: 2.3 mg/dL (ref 1.7–2.4)

## 2016-02-23 MED ORDER — FREE WATER
400.0000 mL | Status: DC
  Administered 2016-02-23 – 2016-02-27 (×25): 400 mL

## 2016-02-23 MED ORDER — DEXTROSE 5 % IV SOLN
INTRAVENOUS | Status: DC
  Administered 2016-02-23 – 2016-03-04 (×15): via INTRAVENOUS

## 2016-02-23 MED ORDER — POTASSIUM CHLORIDE 20 MEQ/15ML (10%) PO SOLN
40.0000 meq | Freq: Once | ORAL | Status: AC
  Administered 2016-02-23: 40 meq
  Filled 2016-02-23: qty 30

## 2016-02-23 NOTE — Progress Notes (Signed)
eLink Physician-Brief Progress Note Patient Name: Theodore DemarkGraig D Edgington DOB: 10/07/1966 MRN: [id number]   Date of Service  02/23/2016  HPI/Events of Note  Camera check on patient. Seems to spontaneously move his left lower extremity. He is lifting his head intermittently off of the bed and turning it to the right side as well. Also appears to be moving his mouth and response to the tube. Eyes are open. Patient is having intermittent periods of hypertension with slow resolution of blood pressure on his arterial line. No obvious evidence of any seizure activity.  eICU Interventions  Continue close monitoring in the ICU at this time. No additional antihypertensive medications at this time.     Intervention Category Major Interventions: Respiratory failure - evaluation and management;Other:  Lawanda CousinsJennings Annarose Ouellet 02/23/2016, 12:54 AM

## 2016-02-23 NOTE — Progress Notes (Signed)
Patient ID: Dale [name], male   DOB: Apr 22, 1966, 50 y.o.   MRN: [id number]    SUBJECTIVE:  Yesterday dobutamine was stopped. Weight down 7 pounds off diuretics.  Sodium higher.  Not waking up.   Non-responsive.   Scheduled Meds: . amiodarone  200 mg Oral BID  . antiseptic oral rinse  7 mL Mouth Rinse 10 times per day  . aspirin  81 mg Oral Daily  . cefTRIAXone (ROCEPHIN)  IV  2 g Intravenous Q12H  . chlorhexidine gluconate  15 mL Mouth Rinse BID  . folic acid  1 mg Per Tube Daily  . free water  400 mL Per Tube 6 times per day  . heparin subcutaneous  5,000 Units Subcutaneous 3 times per day  . insulin aspart  2-6 Units Subcutaneous 6 times per day  . insulin aspart  3 Units Subcutaneous 6 times per day  . insulin glargine  20 Units Subcutaneous Q24H  . isosorbide-hydrALAZINE  1 tablet Oral TID  . pantoprazole sodium  40 mg Per Tube Q24H  . penicillin g benzathine (BICILLIN-LA) IM  2.4 Million Units Intramuscular Weekly  . sodium chloride flush  10-40 mL Intracatheter Q12H  . spironolactone  25 mg Per Tube Daily  . thiamine  100 mg Per Tube Daily   Continuous Infusions: . sodium chloride Stopped (02/22/16 0700)  . sodium chloride 10 mL/hr at 02/22/16 1353  . feeding supplement (VITAL AF 1.2 CAL) 1,000 mL (02/22/16 1400)   PRN Meds:.acetaminophen (TYLENOL) oral liquid 160 mg/5 mL, albuterol, sodium chloride flush    Filed Vitals:   02/23/16 0355 02/23/16 0400 02/23/16 0500 02/23/16 0600  BP: 147/75 125/82  144/87  Pulse: 81 79 82 94  Temp:  99 F (37.2 C) 98.6 F (37 C) 98.6 F (37 C)  TempSrc:  Core (Comment)    Resp: Height:      Weight:  136 lb 7.4 oz (61.9 kg)    SpO2: 98% 98% 98% 95%    Intake/Output Summary (Last 24 hours) at 02/23/16 2536 Last data filed at 02/23/16 0600  Gross per 24 hour  Intake   2250 ml  Output   3060 ml  Net   -810 ml    LABS: Basic Metabolic Panel:  Recent Labs  64/40/34 0300 02/23/16 0316  NA 155* 157*  K 4.0  3.3*  CL 114* 112*  CO2 31 30  GLUCOSE 134* 109*  BUN 46* 45*  CREATININE 1.56* 1.58*  CALCIUM 7.5* 7.8*  MG 2.6* 2.3  PHOS 2.5 3.0   Liver Function Tests: No results for input(s): AST, ALT, ALKPHOS, BILITOT, PROT, ALBUMIN in the last 72 hours. No results for input(s): LIPASE, AMYLASE in the last 72 hours. CBC:  Recent Labs  02/22/16 0300 02/23/16 0316  WBC 11.2* 13.3*  HGB 10.0* 10.7*  HCT 31.0* 33.4*  MCV 93.4 92.5  PLT 219 281   Cardiac Enzymes: No results for input(s): CKTOTAL, CKMB, CKMBINDEX, TROPONINI in the last 72 hours. BNP: Invalid input(s): POCBNP D-Dimer: No results for input(s): DDIMER in the last 72 hours. Hemoglobin A1C: No results for input(s): HGBA1C in the last 72 hours. Fasting Lipid Panel: No results for input(s): CHOL, HDL, LDLCALC, TRIG, CHOLHDL, LDLDIRECT in the last 72 hours. Thyroid Function Tests: No results for input(s): TSH, T4TOTAL, T3FREE, THYROIDAB in the last 72 hours.  Invalid input(s): FREET3 Anemia Panel: No results for input(s): VITAMINB12, FOLATE, FERRITIN, TIBC, IRON, RETICCTPCT in the last 72 hours.  RADIOLOGY: Dg Chest 2 View  02/13/2016  CLINICAL DATA:  50 year old male with history of anterior chest pain for the past 2 days. Low-grade fever. Dizziness. EXAM: CHEST  2 VIEW COMPARISON:  Chest x-ray 06/01/2009. FINDINGS: Lung volumes are normal. No consolidative airspace disease. No pleural effusions. No pneumothorax. No pulmonary nodule or mass noted. Pulmonary vasculature and the cardiomediastinal silhouette are within normal limits. IMPRESSION: No radiographic evidence of acute cardiopulmonary disease. Electronically Signed   By: Trudie Reed M.D.   On: 02/13/2016 17:57   Dg Thoracic Spine 2 View  02/13/2016  CLINICAL DATA:  O thoracic and lower back pain for 1 week. Fall 2 days ago. Pain was before the fall. EXAM: THORACIC SPINE 2 VIEWS COMPARISON:  Chest x-ray today and 06/01/2009 FINDINGS: There is no evidence of  thoracic spine fracture. Alignment is normal. No other significant bone abnormalities are identified. IMPRESSION: Negative. Electronically Signed   By: Charlett Nose M.D.   On: 02/13/2016 20:45   Dg Lumbar Spine Complete  02/13/2016  CLINICAL DATA:  Low back pain and recent fall. EXAM: LUMBAR SPINE - COMPLETE 4+ VIEW COMPARISON:  08/25/2014 lumbar spine radiographs. FINDINGS: This report assumes 5 non rib-bearing lumbar vertebrae. Lumbar vertebral body heights are preserved, with no fracture. There is mild loss of disc height at L4-5 with associated minimal spondylosis, not appreciably changed. Otherwise preserved lumbar disc spaces. There is new 3 mm anterolisthesis at L4-5. No appreciable facet arthropathy. No appreciable foraminal stenosis. No aggressive appearing focal osseous lesions. IMPRESSION: 1. No lumbar spine fracture. 2. Stable mild degenerative disc disease at L4-5. 3. New mild 3 mm anterolisthesis at L4-5. Electronically Signed   By: Delbert Phenix M.D.   On: 02/13/2016 20:47   Ct Head Wo Contrast  02/16/2016  CLINICAL DATA:  Fever and altered mental status; tremors EXAM: CT HEAD WITHOUT CONTRAST TECHNIQUE: Contiguous axial images were obtained from the base of the skull through the vertex without intravenous contrast. COMPARISON:  June 01, 2009 FINDINGS: The ventricles are mildly enlarged in a generalized manner and appear larger compared to the prior study. The sulci appear unremarkable. There is no intracranial mass, hemorrhage, extra-axial fluid collection, or midline shift. No focal gray-white compartment lesions are identified. No evidence of acute infarct. Bony calvarium appears intact. The mastoid air cells are clear. There is opacification in the left maxillary antrum as well as in multiple ethmoid air cells bilaterally. There is mild mucosal thickening in the left sphenoid sinus region. No intraorbital lesions are apparent. IMPRESSION: Ventricles mildly enlarged with sulci appearing  normal. This appearance potentially could represent a degree of normal pressure hydrocephalus. This finding also could be secondary to prior or recent meningitis meningitis. No intracranial mass, hemorrhage, or extra-axial fluid collection. No acute infarct evident. Multifocal sinusitis. Given these current findings, brain MRI pre and post-contrast may be helpful to further assess, in particular to further evaluate the meningeal regions. Electronically Signed   By: Bretta Bang III M.D.   On: 02/16/2016 14:04   Dg Chest Port 1 View  02/22/2016  CLINICAL DATA:  Respiratory failure. EXAM: PORTABLE CHEST 1 VIEW COMPARISON:  02/21/2016. FINDINGS: Endotracheal tube, NG tube, right IJ sheath in stable position. Interim removal Swan-Ganz catheter. Heart size stable. Low lung volumes with mild bibasilar atelectasis and or infiltrates. Interim improvement from prior exam. Small left pleural effusion cannot be excluded. No pneumothorax. IMPRESSION: 1. Level of Swan-Ganz catheter. Endotracheal tube, NG tube, right IJ sheath in stable position. 2. Low lung volumes  with mild bibasilar atelectasis and/or infiltrates. Interim improvement from prior exam. Small left pleural effusion cannot be excluded. Electronically Signed   By: Maisie Fus  Register   On: 02/22/2016 07:13   Dg Chest Port 1 View  02/21/2016  CLINICAL DATA:  Respiratory failure EXAM: PORTABLE CHEST 1 VIEW COMPARISON:  Chest radiograph from one day prior. FINDINGS: Right internal jugular Swan-Ganz catheter terminates in the right infrahilar region. Enteric tube enters the stomach with the tip not seen on this image. Endotracheal tube tip is 2.5 cm above the carina. Stable cardiomediastinal silhouette with normal heart size. No pneumothorax. Stable small left pleural effusion. No pulmonary edema. Stable left lower lobe consolidation. IMPRESSION: 1. Well-positioned endotracheal tube. Right internal jugular Swan-Ganz catheter terminates over the right infrahilar  region, consider retracting 2-3 cm. 2. Stable small left pleural effusion. 3. Stable left lower lobe consolidation, favor atelectasis. Electronically Signed   By: Delbert Phenix M.D.   On: 02/21/2016 08:00   Dg Chest Port 1 View  02/20/2016  CLINICAL DATA:  CHF EXAM: PORTABLE CHEST 1 VIEW COMPARISON:  Chest radiograph from one day prior. FINDINGS: Right internal jugular Swan-Ganz catheter terminates over the central right lower lung, unchanged. Endotracheal tube tip is 2.4 cm above the carina. Enteric tube enters the stomach with the tip not seen on this image. Intra-aortic balloon pump marker terminates over the proximal descending thoracic aorta at the T7-8 level. Stable cardiomediastinal silhouette with normal heart size. No pneumothorax. No pleural effusion. Hazy opacities throughout the bilateral parahilar lungs are not appreciably changed. Probable complete left lower lobe atelectasis, unchanged. IMPRESSION: 1. Right internal jugular Swan-Ganz catheter terminates over the central lower right lung, consider retracting 3-4 cm . 2. Otherwise well-positioned support structures as described. 3. Stable hazy opacities throughout the parahilar lungs, favor pulmonary edema. Electronically Signed   By: Delbert Phenix M.D.   On: 02/20/2016 09:34   Dg Chest Port 1 View  02/19/2016  CLINICAL DATA:  CHF EXAM: PORTABLE CHEST 1 VIEW COMPARISON:  02/18/2016 FINDINGS: Support devices remain in place, unchanged. Heart is normal size. Diffuse interstitial and alveolar opacities are again noted, likely edema. This is somewhat more confluent in both lung bases, left greater than right. Cannot completely exclude infiltrate at the left base. No visible effusions. No acute bony abnormality. IMPRESSION: Continued diffuse interstitial and alveolar opacities, likely edema. Somewhat more focal opacities in the lung bases, left greater than right. Cannot exclude pneumonia. Electronically Signed   By: Charlett Nose M.D.   On: 02/19/2016  08:29   Dg Chest Port 1 View  02/18/2016  CLINICAL DATA:  Entry aortic balloon pump asist. EXAM: PORTABLE CHEST 1 VIEW COMPARISON:  February 17, 2016. FINDINGS: The heart size and mediastinal contours are within normal limits. No pneumothorax or pleural effusion is noted. Endotracheal nasogastric tubes are unchanged in position. Right internal jugular Swan-Ganz catheter is unchanged with distal tip in expected position of right pulmonary artery. Distal tip of aortic balloon catheter is seen over expected position of superior portion of descending thoracic aorta just below aortic knob. Stable bilateral perihilar opacities are noted concerning for edema or inflammation. The visualized skeletal structures are unremarkable. IMPRESSION: Distal tip of aortic balloon catheter appears to be slightly more inferior in position compared to prior exam, just below aortic knob currently. This projects over the expected position of the superior aspect of descending thoracic aorta. Stable bilateral perihilar opacities are noted concerning for edema or inflammation. Otherwise stable support apparatus. Electronically Signed   By:  Lupita Raider, M.D.   On: 02/18/2016 07:24   Dg Chest Port 1 View  02/17/2016  CLINICAL DATA:  Ventilator dependence. EXAM: PORTABLE CHEST 1 VIEW COMPARISON:  Earlier the same day. FINDINGS: 1822 hours. Endotracheal tube tip is 7.4 cm above the base of the carina. Right IJ pulmonary artery catheter tip is in the interlobar pulmonary artery. The tip of the intra-aortic balloon pump projects over the inferior aspect of the transverse aorta. The NG tube passes into the stomach although the distal tip position is not included on the film. Proximal port of the NG tube is at or just above the EG junction. Diffuse interstitial and central alveolar opacities suggest edema. Telemetry leads overlie the chest. IMPRESSION: The tip of the intra-aortic balloon pump projects over the inferior aspect of the transverse  aorta. Electronically Signed   By: Kennith Center M.D.   On: 02/17/2016 18:34   Dg Chest Port 1 View  02/17/2016  CLINICAL DATA:  50 year old male with a history of Swan-Ganz catheter placement. EXAM: PORTABLE CHEST 1 VIEW COMPARISON:  02/17/2016 FINDINGS: Cardiomediastinal silhouette unchanged in size and contour. Skull pads again project over the thorax. Unchanged endotracheal tube, gastric tube. Interval placement of right IJ sheath, through which a Swan-Ganz catheter has been placed terminating in the right pulmonary arteries. Persisting bilateral airspace opacity IMPRESSION: Interval placement of right IJ sheath, through which a Swan-Ganz catheter has been placed and terminates in the right pulmonary arteries at the hilum. Similar appearance of bilateral airspace opacities, potentially infection and/or edema. Unchanged support apparatus as above. Signed, Yvone Neu. Loreta Ave, DO [facility] Electronically Signed   By: Gilmer Mor D.O.   On: 02/17/2016 13:53   Dg Chest Port 1 View  02/17/2016  CLINICAL DATA:  Hypoxia EXAM: PORTABLE CHEST 1 VIEW COMPARISON:  February 16, 2016 FINDINGS: Endotracheal tube tip is 3.5 cm above the carina. Nasogastric tube tip and side port are in the stomach. No pneumothorax evident. There is interstitial edema throughout the lungs bilaterally, perhaps slightly increased from 1 day prior. No airspace consolidation. Heart is upper normal in size with pulmonary vascularity within normal limits. No adenopathy. IMPRESSION: Tube positions as described without pneumothorax. Slight increase in interstitial edema. No airspace consolidation. No change in cardiac silhouette. Electronically Signed   By: Bretta Bang III M.D.   On: 02/17/2016 07:31   Dg Chest Portable 1 View  02/16/2016  CLINICAL DATA:  Endotracheal tube placement EXAM: PORTABLE CHEST 1 VIEW COMPARISON:  Chest x-ray from earlier same day. FINDINGS: Endotracheal  tube well positioned with tip approximately 2 cm above the carina. Enteric tube passes below the diaphragm. Heart size is normal. There is central pulmonary vascular congestion and mild interstitial edema. Suspect small left pleural effusion. No pneumothorax. Osseous structures about the chest are unremarkable. IMPRESSION: 1. Endotracheal tube well positioned with tip approximately 2 cm above the carina. 2. Central pulmonary vascular congestion and mild bilateral interstitial edema suggesting mild volume overload/resuscitation efforts. No frank alveolar pulmonary edema. Electronically Signed   By: Bary Richard M.D.   On: 02/16/2016 19:38   Dg Chest Port 1 View  02/16/2016  CLINICAL DATA:  Altered mental status.  Depression. EXAM: PORTABLE CHEST 1 VIEW COMPARISON:  02/13/2016. FINDINGS: Midline trachea. Normal heart size and mediastinal contours. Left costophrenic angle exclusion. No pleural effusion or pneumothorax. Clear lungs. IMPRESSION: No active disease. Electronically Signed   By: Jeronimo Greaves M.D.   On: 02/16/2016 13:48  Ct Portable Head W/o Cm  02/17/2016  CLINICAL DATA:  50 year old male with a history of hydrocephalus. Acute meningitis. EXAM: CT HEAD WITHOUT CONTRAST TECHNIQUE: Contiguous axial images were obtained from the base of the skull through the vertex without intravenous contrast. COMPARISON:  02/16/2016 FINDINGS: Unremarkable appearance of the calvarium without acute fracture or aggressive lesion. Unremarkable appearance of the scalp soft tissues. Unremarkable appearance of the bilateral orbits. Partially visualized endotracheal tube.  Oral enteric tube. Near complete opacification of the left maxillary sinus with frothy secretions. Trace secretions of the right maxillary sinus. Mucoperiosteal thickening of the ethmoid air cells and the sphenoid sinuses. Trace disease of the frontal sinuses. Mastoid air cells are clear. Layered fluid within the nasopharynx. Configuration the ventricles is  similar to comparison with bifrontal diameter measuring 40 mm, unchanged from prior. Unchanged appearance of the hemispheric sulci. No acute intracranial hemorrhage.  No midline shift. Gray-white differentiation relatively maintained. IMPRESSION: Unchanged ventricular dilation and appearance of the cerebral sulci, with no evidence of acute hemorrhage or infarction. Signed, Yvone NeuJaime S. Loreta AveWagner, DO [facility] Electronically Signed   By: Gilmer MorJaime  Wagner D.O.   On: 02/17/2016 07:23    PHYSICAL EXAM General: Intubated/sedated.  Neck: JVP not elevated, no thyromegaly or thyroid nodule. RIJ  Lungs: Crackles dependently bilaterally.  CV: Nondisplaced PMI.  Heart regular S1/S2, no S3, no murmur.  No peripheral edema.  Abdomen: Soft, no hepatosplenomegaly, no distention.  Neurologic: Intubated/sedated, not responding.  Extremities: No clubbing or cyanosis.  GU: Foley.   TELEMETRY: Reviewed telemetry pt in NSR in 90s  ASSESSMENT AND PLAN: 50 yo with minimal past medical history was admitted with confusion, prior URI symptoms.  Had LP concerning for meningitis and found to have strep bacteremia/meningitis.  Later developed shock requiring pressors and was intubated for airway protection.  Bedside echo showed EF about 10%.  Swan placed, noted cardiogenic shock and had IABP.   1. Shock: Primarily appears to have cardiogenic shock.  Cardiac output much improved, IABP out 3/18.   CO-OX 82%. Off dobutamine.  2. Acute hypoxemic respiratory failure: CXR looked like pulmonary edema.  Oxygen requirement has decreased with diuresis.   3. ID: Strep bacteremia with meningitis.  On ceftriaxone. WBCs coming down.  Completed  with 4 days of dexamethasone.  Also treating for syphilis.  4. AKI: Creatinine stable.  5. Acute systolic CHF: EF 16-10%20-25% on 3/16 echo.  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   - Continue spironolactone, Bidil 1 tab tid. - Hemodynamics much better,  will repeat echo today (limited for EF) to look for improvement.  6. Elevated LFTs: Suspect shock liver. LFTs improved.  7. Cardiac arrest: PEA arrest then ventricular fibrillation in ER.  Now on po amiodarone in NSR.  8. Neuro: Weaning sedation to see if he will awaken.  9. Hypernatremia:  Sodium 157 . Increase free water boluses.  Dale Clegg NP-C  02/23/2016 7:22 AM   Patient seen with NP, agree with the above note.  He is hemodynamically stable but not waking up.  Will repeat echo, can be limited for EF evaluation.  Continue current cardiac regimen.  Good UOP, does not appear to need diuretic.    Dale [name] 02/23/2016 7:54 AM

## 2016-02-23 NOTE — Progress Notes (Signed)
eLink Physician-Brief Progress Note Patient Name: Dale [name] DOB: 04/20/1966 MRN: [id number]   Date of Service  02/23/2016  HPI/Events of Note  Camera check on patient with alkalosis.  PCO2 36. Ventilator rate set at 20 but patient has spontaneous respirations approximately 24-25 breaths per minute. Respiratory therapist at bedside.  eICU Interventions  Decreasing set ventilator rate to 16 breaths per minute.     Intervention Category Major Interventions: Respiratory failure - evaluation and management;Acid-Base disturbance - evaluation and management  Lawanda CousinsJennings Algernon [name] 02/23/2016, 3:55 AM

## 2016-02-23 NOTE — Progress Notes (Signed)
Pt showing increased movement and BP that resolves gradually. Increasing agitation. No response to painful stimuli or commands. Contacted Dr. Jamison NeighborNestor who looked at the patient and decided to continue monitoring at this time.

## 2016-02-23 NOTE — Progress Notes (Signed)
eLink Physician-Brief Progress Note Patient Name: Dale [name] DOB: 10/10/1966 MRN: [id number]   Date of Service  02/23/2016  HPI/Events of Note  Mild hypokalemia & fluctuating hypernatremia. Renal function continuing to improve.  eICU Interventions  1. Increase free water to 400 mL every 4 hours 2. KCl 40 mEq Via tube 1     Intervention Category Intermediate Interventions: Electrolyte abnormality - evaluation and management  [name] 02/23/2016, 4:21 AM

## 2016-02-23 NOTE — Progress Notes (Signed)
PULMONARY / CRITICAL CARE MEDICINE   Name: Dale [name] MRN: [id number] DOB: 02/08/1966    ADMISSION DATE:  02/16/2016   CONSULTATION DATE:  02/16/16  REFERRING MD:  Rito EhrlichKrishnan  CHIEF COMPLAINT:  AMS   STUDIES:  3/14 CT head > multifocal sinusitis, enlarged ventricles 3/14 LP >> glucose < 20, protein > 600, WBC 340 (88%N), 89 RBC  CULTURES: 3/14 Blood > strep pneumo 3/14 Urine >neg 3/14 CSF > strep pneumo   ANTIBIOTICS: 3/14 Ampicillin >> 3/14 3/14 Vancomycin >>off 3/14 Rocephin >>  LINES/TUBES: 3/14 ETT >> 3/15 Rt femoral CVL >> 3/15 Rt femoral aline >> 3/15 Rt IJ introducer >>   SIGNIFICANT EVENTS: 3/14 Admit, VDRF, ID/cardiology/neurology consulted 3/15 Cardiac arrest, 5 mins, Asystole > VF > ST; ARDS, septic/cardiogenic shock >> IABP; start paralytic 3/16 - Remains on paralytic, pressors, inotropes. 3/18 - IABP to be removed 3/18 stopping nimbex  SUBJECTIVE/OVERNIGHT/INTERVAL HX Off all sedation.  No events.  Unresponsive.  VITAL SIGNS: BP 89/56 mmHg  Pulse 93  Temp(Src) 99.7 F (37.6 C) (Core (Comment))  Resp 24  Ht 5' 8.9" (1.75 m)  Wt 61.9 kg (136 lb 7.4 oz)  BMI 20.21 kg/m2  SpO2 99%  HEMODYNAMICS:    VENTILATOR SETTINGS: Vent Mode:  [-] PRVC FiO2 (%):  [40 %] 40 % Set Rate:  [16 bmp-20 bmp] 16 bmp Vt Set:  [410 mL] 410 mL PEEP:  [5 cmH20] 5 cmH20 Plateau Pressure:  [11 cmH20-14 cmH20] 14 cmH20  INTAKE / OUTPUT: I/O last 3 completed shifts: In: 3871.3 [I.V.:546.3; NG/GT:3175; IV Piggyback:150] Out: 4105 [Urine:4105]  PHYSICAL EXAMINATION: General: Paralytics off, not responsive, does not withdraw to pain, spontaneous movement at time but nothing purposeful. Neuro: Off sedation, unresponsive, does not withdraw to pain. HEENT: [place]/AT, PERRL, EOM-spontaneous and not withdrawing to pain. Cardiac: RRR, Nl S1/S2, -M/R/G. Chest: CTA bilaterally. Abd: Soft, NT, ND and +BS. Ext: Intact. Skin: No rashes  LABS:  PULMONARY  Recent  Labs Lab 02/18/16 1213 02/19/16 0400  02/20/16 0405 02/20/16 0425 02/21/16 0349 02/21/16 0725 02/22/16 0320 02/23/16 0300  PHART 7.481* 7.553*  --  7.420  --  7.449  --   --  7.531*  PCO2ART 31.5* 28.6*  --  45.2*  --  46.7*  --   --  36.3  PO2ART 63.0* 101*  --  84.7  --  89.3  --   --  132*  HCO3 23.9 25.4*  --  28.8*  --  31.9*  --   --  30.3*  TCO2 25 26.3  --  30.2  --  33.4  --   --  31.4  O2SAT 95.0 97.8  < > 94.8 73.0 96.7 82.1 82.4 98.5  < > = values in this interval not displayed.  CBC  Recent Labs Lab 02/21/16 0350 02/22/16 0300 02/23/16 0316  HGB 9.7* 10.0* 10.7*  HCT 29.7* 31.0* 33.4*  WBC 11.9* 11.2* 13.3*  PLT 191 219 281   COAGULATION  Recent Labs Lab 02/17/16 0053 02/21/16 0350  INR 1.14 1.13   CARDIAC  Recent Labs Lab 02/16/16 2016 02/17/16 0033 02/17/16 0500 02/17/16 0845  TROPONINI 7.59* 13.02* 6.43* 4.74*   No results for input(s): PROBNP in the last 168 hours.  CHEMISTRY  Recent Labs Lab 02/19/16 0400 02/20/16 0400 02/21/16 0445 02/22/16 0300 02/23/16 0316  NA 142 147* 153* 155* 157*  K 3.4* 3.8 4.0 4.0 3.3*  CL 105 109 114* 114* 112*  CO2 25 27 31 31 30   GLUCOSE 116*  131* 140* 134* 109*  BUN 30* 37* 44* 46* 45*  CREATININE 1.86* 1.59* 1.58* 1.56* 1.58*  CALCIUM 7.0* 7.1* 7.4* 7.5* 7.8*  MG 2.0 2.3 2.5* 2.6* 2.3  PHOS 1.7* 2.9 2.8 2.5 3.0   Estimated Creatinine Clearance: 48.4 mL/min (by C-G formula based on Cr of 1.58).  LIVER  Recent Labs Lab 02/16/16 1200 02/17/16 0033 02/17/16 0053 02/18/16 0500 02/19/16 0400 02/20/16 0400 02/21/16 0350  AST 117* 174*  --  515* 142* 86*  --   ALT 42 45  --  100* 83* 79*  --   ALKPHOS 56 39  --  42 62 45  --   BILITOT 1.1 1.7*  --  0.3 0.3 0.4  --   PROT 7.5 5.0*  --  4.9* 5.0* 5.1*  --   ALBUMIN 2.9* 1.7*  --  1.5* 1.5* 1.6*  --   INR  --   --  1.14  --   --   --  1.13   INFECTIOUS  Recent Labs Lab 02/16/16 2006 02/17/16 0030 02/17/16 0053 02/20/16 1630   LATICACIDVEN 1.5 4.9*  --  1.1  PROCALCITON  --   --  73.53  --    ENDOCRINE CBG (last 3)   Recent Labs  02/23/16 0029 02/23/16 0436 02/23/16 0810  GLUCAP 90 91 105*   IMAGING x48h Dg Chest Port 1 View  02/23/2016  CLINICAL DATA:  Shortness of breath. EXAM: PORTABLE CHEST 1 VIEW COMPARISON:  02/22/2016. FINDINGS: Endotracheal tube tip 1.8 cm above the carina, slight proximal repositioning of 1 a 2 cm should be considered. NG tube noted with tip below left hemidiaphragm. Heart size stable. Bilateral pulmonary infiltrates, left side greater right noted. Small left pleural effusion cannot be excluded. No pneumothorax . IMPRESSION: 1. Endotracheal tube tip 1.8 cm above the carina, slight proximal repositioning of 1 a 2 cm should be considered. NG tube in stable position. 2. Diffuse left lung and right lower lobe pulmonary infiltrates noted. These are new from prior exam. Small left pleural effusion cannot be excluded . Electronically Signed   By: Maisie Fus  Register   On: 02/23/2016 07:31   Dg Chest Port 1 View  02/22/2016  CLINICAL DATA:  Respiratory failure. EXAM: PORTABLE CHEST 1 VIEW COMPARISON:  02/21/2016. FINDINGS: Endotracheal tube, NG tube, right IJ sheath in stable position. Interim removal Swan-Ganz catheter. Heart size stable. Low lung volumes with mild bibasilar atelectasis and or infiltrates. Interim improvement from prior exam. Small left pleural effusion cannot be excluded. No pneumothorax. IMPRESSION: 1. Level of Swan-Ganz catheter. Endotracheal tube, NG tube, right IJ sheath in stable position. 2. Low lung volumes with mild bibasilar atelectasis and/or infiltrates. Interim improvement from prior exam. Small left pleural effusion cannot be excluded. Electronically Signed   By: Maisie Fus  Register   On: 02/22/2016 07:13   50 yo homeless male smoker with hx of ETOH developed upper respiratory symptoms.  Developed progressive confusion, lactic acidosis, AKI. Found to have bacterial  meningitis.  Suffered cardiac arrest, cardiogenic shock with acute systolic CHF, ARDS 3/15.  ASSESSMENT / PLAN:  PULMONARY A:  Acute respiratory failure 2nd to ARDS, acute pulmonary edema. P:   Wean PEEP, FiO2 per ARDS protocol 6 cc/kg for Vt F/u CXR PS trials as able.  CARDIOVASCULAR A:  Septic/cardiogenic shock >> mostly cardiogenic now. Acute systolic CHF. Cardiac arrest 3/15. NSTEMI. P:  Hold diureses for now. Cards medications as needed. IABP out.  RENAL Lab Results  Component Value Date   CREATININE  1.58* 02/23/2016   CREATININE 1.56* 02/22/2016   CREATININE 1.58* 02/21/2016    Recent Labs Lab 02/21/16 0445 02/22/16 0300 02/23/16 0316  K 4.0 4.0 3.3*   A:   AKI 2nd to ATN. Lactic acidosis. Hypokalemia. - repleted Hypernatremia P:   Follow BMP Replace electrolytes as indicated Free water 400 ml q6 hours. D5W 50 ml/hr.  GASTROINTESTINAL A:   Nutrition. Shock liver. P:   Tube feeds Protonix F/u LFTs  HEMATOLOGIC A:   Leukocytosis. P:  F/u CBC  INFECTIOUS A:   Streptococcal bacteremia and meningitis. RPR positive. P:   Day 8 ceftriaxone, decadron per ID Vancomycin off 3/18  ENDOCRINE CBG (last 3)   Recent Labs  02/23/16 0029 02/23/16 0436 02/23/16 0810  GLUCAP 90 91 105*   A:   Hyperglycemia. Relative adrenal insufficiency >> cortisol 15.2 from 3/15. P:   Insulin gtt. Decadron.  NEUROLOGIC A:   Acute metabolic encephalopathy. Hx of ETOH, THC.  P:   Thiamine, folic acid RASS goal -1 Off sedation currently, follow MS. Repeat imaging if he isn't starting to clear MS  AUTOIMMUNE A - no prior hx of autoimmune diz P - Anca neg, ana pos, ds-dna neg results, speckled pattern noticed.  FAMILY - Wife updated bedside, they are discussing trach/peg.  Family meeting on Thursday.  The patient is critically ill with multiple organ systems failure and requires high complexity decision making for assessment and support,  frequent evaluation and titration of therapies, application of advanced monitoring technologies and extensive interpretation of multiple databases.   Critical Care Time devoted to patient care services described in this note is  35  Minutes. This time reflects time of care of this signee Dr Koren Bound. This critical care time does not reflect procedure time, or teaching time or supervisory time of PA/NP/Med student/Med Resident etc but could involve care discussion time.  [name], M.D. [facility] Pulmonary/Critical Care Medicine. Pager: [phone]. After hours pager: [phone].

## 2016-02-24 ENCOUNTER — Ambulatory Visit (HOSPITAL_COMMUNITY): Payer: Medicaid Other

## 2016-02-24 ENCOUNTER — Inpatient Hospital Stay (HOSPITAL_COMMUNITY): Payer: Medicaid Other

## 2016-02-24 DIAGNOSIS — I509 Heart failure, unspecified: Secondary | ICD-10-CM

## 2016-02-24 DIAGNOSIS — G919 Hydrocephalus, unspecified: Secondary | ICD-10-CM

## 2016-02-24 LAB — GLUCOSE, CAPILLARY
GLUCOSE-CAPILLARY: 107 mg/dL — AB (ref 65–99)
GLUCOSE-CAPILLARY: 114 mg/dL — AB (ref 65–99)
GLUCOSE-CAPILLARY: 117 mg/dL — AB (ref 65–99)
GLUCOSE-CAPILLARY: 117 mg/dL — AB (ref 65–99)
GLUCOSE-CAPILLARY: 99 mg/dL (ref 65–99)
Glucose-Capillary: 116 mg/dL — ABNORMAL HIGH (ref 65–99)
Glucose-Capillary: 122 mg/dL — ABNORMAL HIGH (ref 65–99)
Glucose-Capillary: 118 mg/dL — ABNORMAL HIGH (ref 65–99)

## 2016-02-24 LAB — BLOOD GAS, ARTERIAL
Acid-Base Excess: 7.7 mmol/L — ABNORMAL HIGH (ref 0.0–2.0)
Bicarbonate: 30.9 mEq/L — ABNORMAL HIGH (ref 20.0–24.0)
DRAWN BY: 449561
FIO2: 0.4
MECHVT: 410 mL
O2 Saturation: 98.4 %
PATIENT TEMPERATURE: 98.6
PEEP: 5 cmH2O
PO2 ART: 131 mmHg — AB (ref 80.0–100.0)
RATE: 16 resp/min
TCO2: 32.1 mmol/L (ref 0–100)
pCO2 arterial: 37.4 mmHg (ref 35.0–45.0)
pH, Arterial: 7.527 — ABNORMAL HIGH (ref 7.350–7.450)

## 2016-02-24 LAB — MAGNESIUM: MAGNESIUM: 2.3 mg/dL (ref 1.7–2.4)

## 2016-02-24 LAB — BASIC METABOLIC PANEL
ANION GAP: 6 (ref 5–15)
BUN: 39 mg/dL — ABNORMAL HIGH (ref 6–20)
CHLORIDE: 111 mmol/L (ref 101–111)
CO2: 33 mmol/L — AB (ref 22–32)
Calcium: 7.6 mg/dL — ABNORMAL LOW (ref 8.9–10.3)
Creatinine, Ser: 1.61 mg/dL — ABNORMAL HIGH (ref 0.61–1.24)
GFR calc non Af Amer: 48 mL/min — ABNORMAL LOW (ref >60)
GFR, EST AFRICAN AMERICAN: 56 mL/min — AB (ref >60)
Glucose, Bld: 127 mg/dL — ABNORMAL HIGH (ref 65–99)
POTASSIUM: 3.4 mmol/L — AB (ref 3.5–5.1)
Sodium: 150 mmol/L — ABNORMAL HIGH (ref 135–145)

## 2016-02-24 LAB — CBC
HEMATOCRIT: 32.8 % — AB (ref 39.0–52.0)
Hemoglobin: 10.5 g/dL — ABNORMAL LOW (ref 13.0–17.0)
MCH: 29.9 pg (ref 26.0–34.0)
MCHC: 32 g/dL (ref 30.0–36.0)
MCV: 93.4 fL (ref 78.0–100.0)
Platelets: 262 10*3/uL (ref 150–400)
RBC: 3.51 MIL/uL — ABNORMAL LOW (ref 4.22–5.81)
RDW: 15.1 % (ref 11.5–15.5)
WBC: 13.7 10*3/uL — ABNORMAL HIGH (ref 4.0–10.5)

## 2016-02-24 LAB — CARBOXYHEMOGLOBIN
CARBOXYHEMOGLOBIN: 1.1 % (ref 0.5–1.5)
METHEMOGLOBIN: 0.7 % (ref 0.0–1.5)
O2 SAT: 73.6 %
TOTAL HEMOGLOBIN: 12.4 g/dL — AB (ref 13.5–18.0)

## 2016-02-24 LAB — ECHOCARDIOGRAM COMPLETE
HEIGHTINCHES: 68.898 in
Weight: 2232.82 oz

## 2016-02-24 LAB — PHOSPHORUS: PHOSPHORUS: 5.4 mg/dL — AB (ref 2.5–4.6)

## 2016-02-24 MED ORDER — CARVEDILOL 3.125 MG PO TABS
3.1250 mg | ORAL_TABLET | Freq: Two times a day (BID) | ORAL | Status: DC
  Administered 2016-02-24 – 2016-02-25 (×4): 3.125 mg via ORAL
  Filled 2016-02-24 (×4): qty 1

## 2016-02-24 MED ORDER — POTASSIUM CHLORIDE 10 MEQ/50ML IV SOLN: 10.0000 meq | INTRAVENOUS | Status: DC

## 2016-02-24 MED ORDER — POTASSIUM CHLORIDE 20 MEQ/15ML (10%) PO SOLN
40.0000 meq | Freq: Three times a day (TID) | ORAL | Status: AC
  Administered 2016-02-24 (×2): 40 meq
  Filled 2016-02-24 (×2): qty 30

## 2016-02-24 MED ORDER — POTASSIUM CHLORIDE 20 MEQ/15ML (10%) PO SOLN
30.0000 meq | Freq: Once | ORAL | Status: AC
  Administered 2016-02-24: 30 meq
  Filled 2016-02-24: qty 30

## 2016-02-24 NOTE — Progress Notes (Signed)
Echocardiogram 2D Echocardiogram has been performed.  Nolon RodBrown, Tony 02/24/2016, 1:02 PM

## 2016-02-24 NOTE — Progress Notes (Signed)
CSW following for disposition planning. Patient is currently intubated and non-responsive. There is discussion of trach/peg. Family meeting scheduled for tomorrow 3/23 to discuss goals of care. CSW will continue to follow.   Noe GensAshley Gardner, MSW, LCSW [facility] Clinical Social Worker  [phone]

## 2016-02-24 NOTE — Progress Notes (Signed)
eLink Physician-Brief Progress Note Patient Name: Dale [name] DOB: 05/04/1966 MRN: [id number]   Date of Service  02/24/2016  HPI/Events of Note  Patient is intermittently stacking breaths. No improvement when TV increased to 600 mL.   eICU Interventions  Will have nurse obtain AM ABG and portable CXR now.      Intervention Category Major Interventions: Respiratory failure - evaluation and management  Milanni Ayub Eugene 02/24/2016, 1:08 AM

## 2016-02-24 NOTE — Progress Notes (Signed)
eLink Physician-Brief Progress Note Patient Name: Dale [name] DOB: 07/02/1966 MRN: [id number]   Date of Service  02/24/2016  HPI/Events of Note  K+ = 3.4 and Creatinine = 1.61.  eICU Interventions  Will replete K+.     Intervention Category Intermediate Interventions: Electrolyte abnormality - evaluation and management  Andy Moye Eugene 02/24/2016, 5:10 AM

## 2016-02-24 NOTE — Progress Notes (Signed)
PULMONARY / CRITICAL CARE MEDICINE   Name: Dale [name] MRN: [id number] DOB: 1966/08/09    ADMISSION DATE:  02/16/2016   CONSULTATION DATE:  02/16/16  REFERRING MD:  Rito Ehrlich  CHIEF COMPLAINT:  AMS   STUDIES:  3/14 CT head > multifocal sinusitis, enlarged ventricles 3/14 LP >> glucose < 20, protein > 600, WBC 340 (88%N), 89 RBC  CULTURES: 3/14 Blood > strep pneumo 3/14 Urine >neg 3/14 CSF > strep pneumo   ANTIBIOTICS: 3/14 Ampicillin >> 3/14 3/14 Vancomycin >>off 3/14 Rocephin >>  LINES/TUBES: 3/14 ETT >> 3/15 Rt femoral CVL >> 3/15 Rt femoral aline >> 3/15 Rt IJ introducer >>   SIGNIFICANT EVENTS: 3/14 Admit, VDRF, ID/cardiology/neurology consulted 3/15 Cardiac arrest, 5 mins, Asystole > VF > ST; ARDS, septic/cardiogenic shock >> IABP; start paralytic 3/16 - Remains on paralytic, pressors, inotropes. 3/18 - IABP to be removed 3/18 stopping nimbex  SUBJECTIVE/OVERNIGHT/INTERVAL HX Off all sedation.  No events.  Unresponsive.  VITAL SIGNS: BP 136/76 mmHg  Pulse 92  Temp(Src) 97.7 F (36.5 C) (Core (Comment))  Resp 31  Ht 5' 8.9" (1.75 m)  Wt 63.3 kg (139 lb 8.8 oz)  BMI 20.67 kg/m2  SpO2 99%  HEMODYNAMICS:    VENTILATOR SETTINGS: Vent Mode:  [-] PRVC FiO2 (%):  [30 %-40 %] 30 % Set Rate:  [16 bmp] 16 bmp Vt Set:  [410 mL] 410 mL PEEP:  [5 cmH20] 5 cmH20 Pressure Support:  [14 cmH20] 14 cmH20 Plateau Pressure:  [12 cmH20-15 cmH20] 14 cmH20  INTAKE / OUTPUT: I/O last 3 completed shifts: In: 6879.2 [I.V.:1139.2; NG/GT:5590; IV Piggyback:150] Out: 4730 [Urine:4730]  PHYSICAL EXAMINATION: General: Paralytics off, not responsive, does not withdraw to pain, spontaneous movement at time but nothing purposeful. Neuro: Off sedation, unresponsive, does not withdraw to pain. HEENT: [place]/AT, PERRL, EOM-spontaneous and not withdrawing to pain. Cardiac: RRR, Nl S1/S2, -M/R/G. Chest: CTA bilaterally. Abd: Soft, NT, ND and +BS. Ext: Intact. Skin: No  rashes  LABS:  PULMONARY  Recent Labs Lab 02/19/16 0400  02/20/16 0405  02/21/16 0349 02/21/16 0725 02/22/16 0320 02/23/16 0300 02/24/16 0116 02/24/16 0411  PHART 7.553*  --  7.420  --  7.449  --   --  7.531* 7.527*  --   PCO2ART 28.6*  --  45.2*  --  46.7*  --   --  36.3 37.4  --   PO2ART 101*  --  84.7  --  89.3  --   --  132* 131*  --   HCO3 25.4*  --  28.8*  --  31.9*  --   --  30.3* 30.9*  --   TCO2 26.3  --  30.2  --  33.4  --   --  31.4 32.1  --   O2SAT 97.8  < > 94.8  < > 96.7 82.1 82.4 98.5 98.4 73.6  < > = values in this interval not displayed.  CBC  Recent Labs Lab 02/22/16 0300 02/23/16 0316 02/24/16 0350  HGB 10.0* 10.7* 10.5*  HCT 31.0* 33.4* 32.8*  WBC 11.2* 13.3* 13.7*  PLT 219 281 262   COAGULATION  Recent Labs Lab 02/21/16 0350  INR 1.13   CARDIAC No results for input(s): TROPONINI in the last 168 hours. No results for input(s): PROBNP in the last 168 hours.  CHEMISTRY  Recent Labs Lab 02/20/16 0400 02/21/16 0445 02/22/16 0300 02/23/16 0316 02/24/16 0350  NA 147* 153* 155* 157* 150*  K 3.8 4.0 4.0 3.3* 3.4*  CL 109 114*  114* 112* 111  CO2 27 31 31 30  33*  GLUCOSE 131* 140* 134* 109* 127*  BUN 37* 44* 46* 45* 39*  CREATININE 1.59* 1.58* 1.56* 1.58* 1.61*  CALCIUM 7.1* 7.4* 7.5* 7.8* 7.6*  MG 2.3 2.5* 2.6* 2.3 2.3  PHOS 2.9 2.8 2.5 3.0 5.4*   Estimated Creatinine Clearance: 48.6 mL/min (by C-G formula based on Cr of 1.61).  LIVER  Recent Labs Lab 02/18/16 0500 02/19/16 0400 02/20/16 0400 02/21/16 0350  AST 515* 142* 86*  --   ALT 100* 83* 79*  --   ALKPHOS 42 62 45  --   BILITOT 0.3 0.3 0.4  --   PROT 4.9* 5.0* 5.1*  --   ALBUMIN 1.5* 1.5* 1.6*  --   INR  --   --   --  1.13   INFECTIOUS  Recent Labs Lab 02/20/16 1630  LATICACIDVEN 1.1   ENDOCRINE CBG (last 3)   Recent Labs  02/24/16 0358 02/24/16 0750 02/24/16 1129  GLUCAP 116* 122* 99   IMAGING x48h Dg Chest Port 1 View  02/24/2016  CLINICAL DATA:   Endotracheal tube assessment. EXAM: PORTABLE CHEST 1 VIEW COMPARISON:  02/23/2016 FINDINGS: Endotracheal tube is present with tip measuring 2.5 cm above the carinal. Enteric tube tip is off the field of view but below the left hemidiaphragm. Shallow inspiration. Heart size and pulmonary vascularity are normal. Mild serration of left hemidiaphragm suggesting infiltration and/ or effusion. No pneumothorax. IMPRESSION: Endotracheal tube tip measures 2.5 cm above the carina. Infiltration and/ or effusion in the left lung base. Electronically Signed   By: Burman NievesWilliam  Stevens M.D.   On: 02/24/2016 01:34   Dg Chest Port 1 View  02/23/2016  CLINICAL DATA:  Shortness of breath. EXAM: PORTABLE CHEST 1 VIEW COMPARISON:  02/22/2016. FINDINGS: Endotracheal tube tip 1.8 cm above the carina, slight proximal repositioning of 1 a 2 cm should be considered. NG tube noted with tip below left hemidiaphragm. Heart size stable. Bilateral pulmonary infiltrates, left side greater right noted. Small left pleural effusion cannot be excluded. No pneumothorax . IMPRESSION: 1. Endotracheal tube tip 1.8 cm above the carina, slight proximal repositioning of 1 a 2 cm should be considered. NG tube in stable position. 2. Diffuse left lung and right lower lobe pulmonary infiltrates noted. These are new from prior exam. Small left pleural effusion cannot be excluded . Electronically Signed   By: Maisie Fushomas  Register   On: 02/23/2016 07:31   50 yo homeless male smoker with hx of ETOH developed upper respiratory symptoms.  Developed progressive confusion, lactic acidosis, AKI. 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  Suffered cardiac arrest, cardiogenic shock with acute systolic CHF, ARDS 3/15.  ASSESSMENT / PLAN:  PULMONARY A:  Acute respiratory failure 2nd to ARDS, acute pulmonary edema. P:   Wean PEEP, FiO2 per ARDS protocol 6 cc/kg for Vt F/u CXR PS trials as able. Family meeting in AM to discuss trach/peg vs one way  extubation.  CARDIOVASCULAR A:  Septic/cardiogenic shock >> mostly cardiogenic now. Acute systolic CHF. Cardiac arrest 3/15. NSTEMI. P:  Hold diureses for now. Cards medications as needed. IABP out.  RENAL Lab Results  Component Value Date   CREATININE 1.61* 02/24/2016   CREATININE 1.58* 02/23/2016   CREATININE 1.56* 02/22/2016    Recent Labs Lab 02/22/16 0300 02/23/16 0316 02/24/16 0350  K 4.0 3.3* 3.4*   A:   AKI 2nd to ATN. Lactic acidosis. Hypokalemia. - repleted Hypernatremia P:   Follow BMP Replace electrolytes as indicated  Free water 400 ml q6 hours. D5W 50 ml/hr.  GASTROINTESTINAL A:   Nutrition. Shock liver. P:   Tube feeds. Protonix. F/u LFTs.  HEMATOLOGIC A:   Leukocytosis. P:  F/u CBC  INFECTIOUS A:   Streptococcal bacteremia and meningitis. RPR positive. P:   Day 9 ceftriaxone, decadron per ID Vancomycin off 3/18 Pen G 2.4 million units IM weekly.  ENDOCRINE CBG (last 3)   Recent Labs  02/24/16 0358 02/24/16 0750 02/24/16 1129  GLUCAP 116* 122* 99   A:   Hyperglycemia. Relative adrenal insufficiency >> cortisol 15.2 from 3/15. P:   Insulin gtt. Decadron.  NEUROLOGIC A:   Acute metabolic encephalopathy. Hx of ETOH, THC.  P:   Thiamine, folic acid RASS goal -1 Off sedation currently, follow MS. Repeat imaging if he isn't starting to clear MS  AUTOIMMUNE A - no prior hx of autoimmune diz P - Anca neg, ana pos, ds-dna neg results, speckled pattern noticed.  FAMILY - Son updated bedside.  They believe patient is purposeful but clearly he is not.  Family meeting in AM for plan on trach/peg.  The patient is critically ill with multiple organ systems failure and requires high complexity decision making for assessment and support, frequent evaluation and titration of therapies, application of advanced monitoring technologies and extensive interpretation of multiple databases.   Critical Care Time devoted to  patient care services described in this note is  35  Minutes. This time reflects time of care of this signee Dr Koren Bound. This critical care time does not reflect procedure time, or teaching time or supervisory time of PA/NP/Med student/Med Resident etc but could involve care discussion time.  [name], M.D. [facility] Pulmonary/Critical Care Medicine. Pager: [phone]. After hours pager: [phone].

## 2016-02-24 NOTE — Progress Notes (Signed)
Patient ID: Dale [name], male   DOB: [birth year], 50 y.o.   MRN: [id number]    SUBJECTIVE:  Off dobutamine. Sodium lower.  Not waking up.   Non-responsive.   Scheduled Meds: . amiodarone  200 mg Oral BID  . antiseptic oral rinse  7 mL Mouth Rinse 10 times per day  . aspirin  81 mg Oral Daily  . cefTRIAXone (ROCEPHIN)  IV  2 g Intravenous Q12H  . chlorhexidine gluconate  15 mL Mouth Rinse BID  . folic acid  1 mg Per Tube Daily  . free water  400 mL Per Tube 6 times per day  . heparin subcutaneous  5,000 Units Subcutaneous 3 times per day  . insulin aspart  2-6 Units Subcutaneous 6 times per day  . insulin aspart  3 Units Subcutaneous 6 times per day  . insulin glargine  20 Units Subcutaneous Q24H  . isosorbide-hydrALAZINE  1 tablet Oral TID  . pantoprazole sodium  40 mg Per Tube Q24H  . penicillin g benzathine (BICILLIN-LA) IM  2.4 Million Units Intramuscular Weekly  . sodium chloride flush  10-40 mL Intracatheter Q12H  . spironolactone  25 mg Per Tube Daily  . thiamine  100 mg Per Tube Daily   Continuous Infusions: . sodium chloride Stopped (02/22/16 0700)  . sodium chloride Stopped (02/23/16 1325)  . dextrose 50 mL/hr at 02/23/16 2000  . feeding supplement (VITAL AF 1.2 CAL) 1,000 mL (02/23/16 0800)   PRN Meds:.acetaminophen (TYLENOL) oral liquid 160 mg/5 mL, albuterol, sodium chloride flush    Filed Vitals:   02/24/16 0400 02/24/16 0500 02/24/16 0600 02/24/16 0700  BP: 118/79 125/88 124/79 110/76  Pulse: 86 87 90 84  Temp: 98.6 F (37 C) 98.6 F (37 C) 98.4 F (36.9 C) 98.2 F (36.8 C)  TempSrc: Core (Comment)     Resp:    18  Height:      Weight:  139 lb 8.8 oz (63.3 kg)    SpO2: 100% 100% 100% 100%    Intake/Output Summary (Last 24 hours) at 02/24/16 0722 Last data filed at 02/24/16 0700  Gross per 24 hour  Intake 5069.17 ml  Output   2875 ml  Net 2194.17 ml    LABS: Basic Metabolic Panel:  Recent Labs  40/98/11 0316 02/24/16 0350  NA 157* 150*    K 3.3* 3.4*  CL 112* 111  CO2 30 33*  GLUCOSE 109* 127*  BUN 45* 39*  CREATININE 1.58* 1.61*  CALCIUM 7.8* 7.6*  MG 2.3 2.3  PHOS 3.0 5.4*   Liver Function Tests: No results for input(s): AST, ALT, ALKPHOS, BILITOT, PROT, ALBUMIN in the last 72 hours. No results for input(s): LIPASE, AMYLASE in the last 72 hours. CBC:  Recent Labs  02/23/16 0316 02/24/16 0350  WBC 13.3* 13.7*  HGB 10.7* 10.5*  HCT 33.4* 32.8*  MCV 92.5 93.4  PLT 281 262   Cardiac Enzymes: No results for input(s): CKTOTAL, CKMB, CKMBINDEX, TROPONINI in the last 72 hours. BNP: Invalid input(s): POCBNP D-Dimer: No results for input(s): DDIMER in the last 72 hours. Hemoglobin A1C: No results for input(s): HGBA1C in the last 72 hours. Fasting Lipid Panel: No results for input(s): CHOL, HDL, LDLCALC, TRIG, CHOLHDL, LDLDIRECT in the last 72 hours. Thyroid Function Tests: No results for input(s): TSH, T4TOTAL, T3FREE, THYROIDAB in the last 72 hours.  Invalid input(s): FREET3 Anemia Panel: No results for input(s): VITAMINB12, FOLATE, FERRITIN, TIBC, IRON, RETICCTPCT in the last 72 hours.  RADIOLOGY: Dg  Chest 2 View  02/13/2016  CLINICAL DATA:  50 year old male with history of anterior chest pain for the past 2 days. Low-grade fever. Dizziness. EXAM: CHEST  2 VIEW COMPARISON:  Chest x-ray 06/01/2009. FINDINGS: Lung volumes are normal. No consolidative airspace disease. No pleural effusions. No pneumothorax. No pulmonary nodule or mass noted. Pulmonary vasculature and the cardiomediastinal silhouette are within normal limits. IMPRESSION: No radiographic evidence of acute cardiopulmonary disease. Electronically Signed   By: Trudie Reed M.D.   On: 02/13/2016 17:57   Dg Thoracic Spine 2 View  02/13/2016  CLINICAL DATA:  O thoracic and lower back pain for 1 week. Fall 2 days ago. Pain was before the fall. EXAM: THORACIC SPINE 2 VIEWS COMPARISON:  Chest x-ray today and 06/01/2009 FINDINGS: There is no evidence  of thoracic spine fracture. Alignment is normal. No other significant bone abnormalities are identified. IMPRESSION: Negative. Electronically Signed   By: Charlett Nose M.D.   On: 02/13/2016 20:45   Dg Lumbar Spine Complete  02/13/2016  CLINICAL DATA:  Low back pain and recent fall. EXAM: LUMBAR SPINE - COMPLETE 4+ VIEW COMPARISON:  08/25/2014 lumbar spine radiographs. FINDINGS: This report assumes 5 non rib-bearing lumbar vertebrae. Lumbar vertebral body heights are preserved, with no fracture. There is mild loss of disc height at L4-5 with associated minimal spondylosis, not appreciably changed. Otherwise preserved lumbar disc spaces. There is new 3 mm anterolisthesis at L4-5. No appreciable facet arthropathy. No appreciable foraminal stenosis. No aggressive appearing focal osseous lesions. IMPRESSION: 1. No lumbar spine fracture. 2. Stable mild degenerative disc disease at L4-5. 3. New mild 3 mm anterolisthesis at L4-5. Electronically Signed   By: Delbert Phenix M.D.   On: 02/13/2016 20:47   Ct Head Wo Contrast  02/16/2016  CLINICAL DATA:  Fever and altered mental status; tremors EXAM: CT HEAD WITHOUT CONTRAST TECHNIQUE: Contiguous axial images were obtained from the base of the skull through the vertex without intravenous contrast. COMPARISON:  June 01, 2009 FINDINGS: The ventricles are mildly enlarged in a generalized manner and appear larger compared to the prior study. The sulci appear unremarkable. There is no intracranial mass, hemorrhage, extra-axial fluid collection, or midline shift. No focal gray-white compartment lesions are identified. No evidence of acute infarct. Bony calvarium appears intact. The mastoid air cells are clear. There is opacification in the left maxillary antrum as well as in multiple ethmoid air cells bilaterally. There is mild mucosal thickening in the left sphenoid sinus region. No intraorbital lesions are apparent. IMPRESSION: Ventricles mildly enlarged with sulci appearing  normal. This appearance potentially could represent a degree of normal pressure hydrocephalus. This finding also could be secondary to prior or recent meningitis meningitis. No intracranial mass, hemorrhage, or extra-axial fluid collection. No acute infarct evident. Multifocal sinusitis. Given these current findings, brain MRI pre and post-contrast may be helpful to further assess, in particular to further evaluate the meningeal regions. Electronically Signed   By: Bretta Bang III M.D.   On: 02/16/2016 14:04   Dg Chest Port 1 View  02/24/2016  CLINICAL DATA:  Endotracheal tube assessment. EXAM: PORTABLE CHEST 1 VIEW COMPARISON:  02/23/2016 FINDINGS: Endotracheal tube is present with tip measuring 2.5 cm above the carinal. Enteric tube tip is off the field of view but below the left hemidiaphragm. Shallow inspiration. Heart size and pulmonary vascularity are normal. Mild serration of left hemidiaphragm suggesting infiltration and/ or effusion. No pneumothorax. IMPRESSION: Endotracheal tube tip measures 2.5 cm above the carina. Infiltration and/ or effusion in  the left lung base. Electronically Signed   By: Burman Nieves M.D.   On: 02/24/2016 01:34   Dg Chest Port 1 View  02/23/2016  CLINICAL DATA:  Shortness of breath. EXAM: PORTABLE CHEST 1 VIEW COMPARISON:  02/22/2016. FINDINGS: Endotracheal tube tip 1.8 cm above the carina, slight proximal repositioning of 1 a 2 cm should be considered. NG tube noted with tip below left hemidiaphragm. Heart size stable. Bilateral pulmonary infiltrates, left side greater right noted. Small left pleural effusion cannot be excluded. No pneumothorax . IMPRESSION: 1. Endotracheal tube tip 1.8 cm above the carina, slight proximal repositioning of 1 a 2 cm should be considered. NG tube in stable position. 2. Diffuse left lung and right lower lobe pulmonary infiltrates noted. These are new from prior exam. Small left pleural effusion cannot be excluded . Electronically  Signed   By: Maisie Fus  Register   On: 02/23/2016 07:31   Dg Chest Port 1 View  02/22/2016  CLINICAL DATA:  Respiratory failure. EXAM: PORTABLE CHEST 1 VIEW COMPARISON:  02/21/2016. FINDINGS: Endotracheal tube, NG tube, right IJ sheath in stable position. Interim removal Swan-Ganz catheter. Heart size stable. Low lung volumes with mild bibasilar atelectasis and or infiltrates. Interim improvement from prior exam. Small left pleural effusion cannot be excluded. No pneumothorax. IMPRESSION: 1. Level of Swan-Ganz catheter. Endotracheal tube, NG tube, right IJ sheath in stable position. 2. Low lung volumes with mild bibasilar atelectasis and/or infiltrates. Interim improvement from prior exam. Small left pleural effusion cannot be excluded. Electronically Signed   By: Maisie Fus  Register   On: 02/22/2016 07:13   Dg Chest Port 1 View  02/21/2016  CLINICAL DATA:  Respiratory failure EXAM: PORTABLE CHEST 1 VIEW COMPARISON:  Chest radiograph from one day prior. FINDINGS: Right internal jugular Swan-Ganz catheter terminates in the right infrahilar region. Enteric tube enters the stomach with the tip not seen on this image. Endotracheal tube tip is 2.5 cm above the carina. Stable cardiomediastinal silhouette with normal heart size. No pneumothorax. Stable small left pleural effusion. No pulmonary edema. Stable left lower lobe consolidation. IMPRESSION: 1. Well-positioned endotracheal tube. Right internal jugular Swan-Ganz catheter terminates over the right infrahilar region, consider retracting 2-3 cm. 2. Stable small left pleural effusion. 3. Stable left lower lobe consolidation, favor atelectasis. Electronically Signed   By: Delbert Phenix M.D.   On: 02/21/2016 08:00   Dg Chest Port 1 View  02/20/2016  CLINICAL DATA:  CHF EXAM: PORTABLE CHEST 1 VIEW COMPARISON:  Chest radiograph from one day prior. FINDINGS: Right internal jugular Swan-Ganz catheter terminates over the central right lower lung, unchanged. Endotracheal tube  tip is 2.4 cm above the carina. Enteric tube enters the stomach with the tip not seen on this image. Intra-aortic balloon pump marker terminates over the proximal descending thoracic aorta at the T7-8 level. Stable cardiomediastinal silhouette with normal heart size. No pneumothorax. No pleural effusion. Hazy opacities throughout the bilateral parahilar lungs are not appreciably changed. Probable complete left lower lobe atelectasis, unchanged. IMPRESSION: 1. Right internal jugular Swan-Ganz catheter terminates over the central lower right lung, consider retracting 3-4 cm . 2. Otherwise well-positioned support structures as described. 3. Stable hazy opacities throughout the parahilar lungs, favor pulmonary edema. Electronically Signed   By: Delbert Phenix M.D.   On: 02/20/2016 09:34   Dg Chest Port 1 View  02/19/2016  CLINICAL DATA:  CHF EXAM: PORTABLE CHEST 1 VIEW COMPARISON:  02/18/2016 FINDINGS: Support devices remain in place, unchanged. Heart is normal size. Diffuse interstitial and  alveolar opacities are again noted, likely edema. This is somewhat more confluent in both lung bases, left greater than right. Cannot completely exclude infiltrate at the left base. No visible effusions. No acute bony abnormality. IMPRESSION: Continued diffuse interstitial and alveolar opacities, likely edema. Somewhat more focal opacities in the lung bases, left greater than right. Cannot exclude pneumonia. Electronically Signed   By: Charlett NoseKevin  Dover M.D.   On: 02/19/2016 08:29   Dg Chest Port 1 View  02/18/2016  CLINICAL DATA:  Entry aortic balloon pump asist. EXAM: PORTABLE CHEST 1 VIEW COMPARISON:  February 17, 2016. FINDINGS: The heart size and mediastinal contours are within normal limits. No pneumothorax or pleural effusion is noted. Endotracheal nasogastric tubes are unchanged in position. Right internal jugular Swan-Ganz catheter is unchanged with distal tip in expected position of right pulmonary artery. Distal tip of aortic  balloon catheter is seen over expected position of superior portion of descending thoracic aorta just below aortic knob. Stable bilateral perihilar opacities are noted concerning for edema or inflammation. The visualized skeletal structures are unremarkable. IMPRESSION: Distal tip of aortic balloon catheter appears to be slightly more inferior in position compared to prior exam, just below aortic knob currently. This projects over the expected position of the superior aspect of descending thoracic aorta. Stable bilateral perihilar opacities are noted concerning for edema or inflammation. Otherwise stable support apparatus. Electronically Signed   By: Lupita RaiderJames  Green Jr, M.D.   On: 02/18/2016 07:24   Dg Chest Port 1 View  02/17/2016  CLINICAL DATA:  Ventilator dependence. EXAM: PORTABLE CHEST 1 VIEW COMPARISON:  Earlier the same day. FINDINGS: 1822 hours. Endotracheal tube tip is 7.4 cm above the base of the carina. Right IJ pulmonary artery catheter tip is in the interlobar pulmonary artery. The tip of the intra-aortic balloon pump projects over the inferior aspect of the transverse aorta. The NG tube passes into the stomach although the distal tip position is not included on the film. Proximal port of the NG tube is at or just above the EG junction. Diffuse interstitial and central alveolar opacities suggest edema. Telemetry leads overlie the chest. IMPRESSION: The tip of the intra-aortic balloon pump projects over the inferior aspect of the transverse aorta. Electronically Signed   By: Kennith CenterEric  Mansell M.D.   On: 02/17/2016 18:34   Dg Chest Port 1 View  02/17/2016  CLINICAL DATA:  50 year old male with a history of Swan-Ganz catheter placement. EXAM: PORTABLE CHEST 1 VIEW COMPARISON:  02/17/2016 FINDINGS: Cardiomediastinal silhouette unchanged in size and contour. Skull pads again project over the thorax. Unchanged endotracheal tube, gastric tube. Interval placement of right IJ sheath, through which a Swan-Ganz  catheter has been placed terminating in the right pulmonary arteries. Persisting bilateral airspace opacity IMPRESSION: Interval placement of right IJ sheath, through which a Swan-Ganz catheter has been placed and terminates in the right pulmonary arteries at the hilum. Similar appearance of bilateral airspace opacities, potentially infection and/or edema. Unchanged support apparatus as above. Signed, Yvone NeuJaime S. Loreta AveWagner, DO [facility] Electronically Signed   By: Gilmer MorJaime  Wagner D.O.   On: 02/17/2016 13:53   Dg Chest Port 1 View  02/17/2016  CLINICAL DATA:  Hypoxia EXAM: PORTABLE CHEST 1 VIEW COMPARISON:  February 16, 2016 FINDINGS: Endotracheal tube tip is 3.5 cm above the carina. Nasogastric tube tip and side port are in the stomach. No pneumothorax evident. There is interstitial edema throughout the lungs bilaterally, perhaps slightly increased from 1 day prior. No  airspace consolidation. Heart is upper normal in size with pulmonary vascularity within normal limits. No adenopathy. IMPRESSION: Tube positions as described without pneumothorax. Slight increase in interstitial edema. No airspace consolidation. No change in cardiac silhouette. Electronically Signed   By: Bretta Bang III M.D.   On: 02/17/2016 07:31   Dg Chest Portable 1 View  02/16/2016  CLINICAL DATA:  Endotracheal tube placement EXAM: PORTABLE CHEST 1 VIEW COMPARISON:  Chest x-ray from earlier same day. FINDINGS: Endotracheal tube well positioned with tip approximately 2 cm above the carina. Enteric tube passes below the diaphragm. Heart size is normal. There is central pulmonary vascular congestion and mild interstitial edema. Suspect small left pleural effusion. No pneumothorax. Osseous structures about the chest are unremarkable. IMPRESSION: 1. Endotracheal tube well positioned with tip approximately 2 cm above the carina. 2. Central pulmonary vascular congestion and mild bilateral  interstitial edema suggesting mild volume overload/resuscitation efforts. No frank alveolar pulmonary edema. Electronically Signed   By: Bary Richard M.D.   On: 02/16/2016 19:38   Dg Chest Port 1 View  02/16/2016  CLINICAL DATA:  Altered mental status.  Depression. EXAM: PORTABLE CHEST 1 VIEW COMPARISON:  02/13/2016. FINDINGS: Midline trachea. Normal heart size and mediastinal contours. Left costophrenic angle exclusion. No pleural effusion or pneumothorax. Clear lungs. IMPRESSION: No active disease. Electronically Signed   By: Jeronimo Greaves M.D.   On: 02/16/2016 13:48   Ct Portable Head W/o Cm  02/17/2016  CLINICAL DATA:  50 year old male with a history of hydrocephalus. Acute meningitis. EXAM: CT HEAD WITHOUT CONTRAST TECHNIQUE: Contiguous axial images were obtained from the base of the skull through the vertex without intravenous contrast. COMPARISON:  02/16/2016 FINDINGS: Unremarkable appearance of the calvarium without acute fracture or aggressive lesion. Unremarkable appearance of the scalp soft tissues. Unremarkable appearance of the bilateral orbits. Partially visualized endotracheal tube.  Oral enteric tube. Near complete opacification of the left maxillary sinus with frothy secretions. Trace secretions of the right maxillary sinus. Mucoperiosteal thickening of the ethmoid air cells and the sphenoid sinuses. Trace disease of the frontal sinuses. Mastoid air cells are clear. Layered fluid within the nasopharynx. Configuration the ventricles is similar to comparison with bifrontal diameter measuring 40 mm, unchanged from prior. Unchanged appearance of the hemispheric sulci. No acute intracranial hemorrhage.  No midline shift. Gray-white differentiation relatively maintained. IMPRESSION: Unchanged ventricular dilation and appearance of the cerebral sulci, with no evidence of acute hemorrhage or infarction. Signed, Yvone Neu. Loreta Ave, DO [facility]  Electronically Signed   By: Gilmer Mor D.O.   On: 02/17/2016 07:23    PHYSICAL EXAM General: Intubated/sedated.  Neck: JVP not elevated, no thyromegaly or thyroid nodule. RIJ  Lungs: Crackles dependently bilaterally.  CV: Nondisplaced PMI.  Heart regular S1/S2, no S3, no murmur.  No peripheral edema.  Abdomen: Soft, no hepatosplenomegaly, no distention.  Neurologic: Intubated/sedated, not responding.  Extremities: No clubbing or cyanosis.  GU: Foley.   TELEMETRY: Reviewed telemetry pt in NSR in 90s  ASSESSMENT AND PLAN: 50 yo with minimal past medical history was admitted with confusion, prior URI symptoms.  Had LP concerning for meningitis and found to have strep bacteremia/meningitis.  Later developed shock requiring pressors and was intubated for airway protection.  Bedside echo showed EF about 10%.  Swan placed, noted cardiogenic shock and had IABP.   1. Shock: Primarily appears to have cardiogenic shock.  Cardiac output much improved, IABP out 3/18.   CO-OX 74%. Off dobutamine.  2. Acute  hypoxemic respiratory failure: CXR looked like pulmonary edema.  Oxygen requirement has decreased with diuresis.   3. ID: Strep bacteremia with meningitis.  On ceftriaxone. WBCs coming down.  Completed  with 4 days of dexamethasone.  Also treating for syphilis.  4. AKI: Creatinine stable.  5. Acute systolic CHF: EF 16-10% on 3/16 echo.  Repeat ECHO today. 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   - Continue spironolactone, Bidil 1 tab tid. - Echo today.  - Good co-ox, will add Coreg 3.125 mg bid.  6. Elevated LFTs: Suspect shock liver. LFTs improved.  7. Cardiac arrest: PEA arrest then ventricular fibrillation in ER.  Now on po amiodarone in NSR.  8. Neuro: Weaning sedation to see if he will awaken.  9. Hypernatremia:  Sodium 150 . Continue  free water boluses.  CCM planning family meeting tomorrow.    Amy Clegg NP-C  02/24/2016 7:22 AM   Patient seen with NP, agree with the above note.  He is  stable clinically, no changes.  Still encephalopathic.   Would continue current cardiac regimen except add Coreg 3.125 mg bid.  Would like limited echo for EF to see if there has been improvement given very stable hemodynamics.   Marca Ancona 02/24/2016 8:08 AM

## 2016-02-25 ENCOUNTER — Inpatient Hospital Stay (HOSPITAL_COMMUNITY): Payer: Medicaid Other

## 2016-02-25 LAB — GLUCOSE, CAPILLARY
GLUCOSE-CAPILLARY: 85 mg/dL (ref 65–99)
GLUCOSE-CAPILLARY: 116 mg/dL — AB (ref 65–99)
GLUCOSE-CAPILLARY: 116 mg/dL — AB (ref 65–99)
GLUCOSE-CAPILLARY: 105 mg/dL — AB (ref 65–99)
Glucose-Capillary: 96 mg/dL (ref 65–99)

## 2016-02-25 LAB — CBC
HEMATOCRIT: 25.6 % — AB (ref 39.0–52.0)
Hemoglobin: 8.2 g/dL — ABNORMAL LOW (ref 13.0–17.0)
MCH: 30 pg (ref 26.0–34.0)
MCHC: 32 g/dL (ref 30.0–36.0)
MCV: 93.8 fL (ref 78.0–100.0)
Platelets: 281 10*3/uL (ref 150–400)
RBC: 2.73 MIL/uL — ABNORMAL LOW (ref 4.22–5.81)
RDW: 15.2 % (ref 11.5–15.5)
WBC: 14 10*3/uL — ABNORMAL HIGH (ref 4.0–10.5)

## 2016-02-25 LAB — MAGNESIUM: Magnesium: 2.3 mg/dL (ref 1.7–2.4)

## 2016-02-25 LAB — BLOOD GAS, ARTERIAL
Acid-Base Excess: 6.7 mmol/L — ABNORMAL HIGH (ref 0.0–2.0)
Bicarbonate: 30.4 mEq/L — ABNORMAL HIGH (ref 20.0–24.0)
DRAWN BY: 44956
FIO2: 0.3
MECHVT: 410 mL
O2 Saturation: 98.2 %
PEEP: 5 cmH2O
PH ART: 7.479 — AB (ref 7.350–7.450)
PO2 ART: 123 mmHg — AB (ref 80.0–100.0)
Patient temperature: 98.6
RATE: 16 resp/min
TCO2: 31.7 mmol/L (ref 0–100)
pCO2 arterial: 41.4 mmHg (ref 35.0–45.0)

## 2016-02-25 LAB — BASIC METABOLIC PANEL
ANION GAP: 12 (ref 5–15)
BUN: 35 mg/dL — ABNORMAL HIGH (ref 6–20)
CALCIUM: 7.9 mg/dL — AB (ref 8.9–10.3)
CO2: 30 mmol/L (ref 22–32)
CREATININE: 1.67 mg/dL — AB (ref 0.61–1.24)
Chloride: 108 mmol/L (ref 101–111)
GFR, EST AFRICAN AMERICAN: 53 mL/min — AB (ref >60)
GFR, EST NON AFRICAN AMERICAN: 46 mL/min — AB (ref >60)
Glucose, Bld: 96 mg/dL (ref 65–99)
Potassium: 4.3 mmol/L (ref 3.5–5.1)
SODIUM: 150 mmol/L — AB (ref 135–145)

## 2016-02-25 LAB — APTT: APTT: 38 s — AB (ref 24–37)

## 2016-02-25 LAB — PHOSPHORUS: PHOSPHORUS: 4.5 mg/dL (ref 2.5–4.6)

## 2016-02-25 LAB — CARBOXYHEMOGLOBIN
Carboxyhemoglobin: 1.1 % (ref 0.5–1.5)
METHEMOGLOBIN: 0.9 % (ref 0.0–1.5)
O2 Saturation: 70.2 %
Total hemoglobin: 10.3 g/dL — ABNORMAL LOW (ref 13.5–18.0)

## 2016-02-25 LAB — PROTIME-INR
INR: 1.03 (ref 0.00–1.49)
PROTHROMBIN TIME: 13.7 s (ref 11.6–15.2)

## 2016-02-25 MED ORDER — MIDAZOLAM HCL 2 MG/2ML IJ SOLN
4.0000 mg | Freq: Once | INTRAMUSCULAR | Status: AC
  Administered 2016-02-26: 2 mg via INTRAVENOUS
  Filled 2016-02-25: qty 4

## 2016-02-25 MED ORDER — ETOMIDATE 2 MG/ML IV SOLN
40.0000 mg | Freq: Once | INTRAVENOUS | Status: AC
  Administered 2016-02-26: 20 mg via INTRAVENOUS

## 2016-02-25 MED ORDER — VECURONIUM BROMIDE 10 MG IV SOLR
10.0000 mg | Freq: Once | INTRAVENOUS | Status: AC
  Administered 2016-02-26: 10 mg via INTRAVENOUS
  Filled 2016-02-25: qty 10

## 2016-02-25 MED ORDER — FENTANYL CITRATE (PF) 100 MCG/2ML IJ SOLN
200.0000 ug | Freq: Once | INTRAMUSCULAR | Status: AC
  Administered 2016-02-26: 100 ug via INTRAVENOUS
  Filled 2016-02-25: qty 4

## 2016-02-25 MED ORDER — ISOSORB DINITRATE-HYDRALAZINE 20-37.5 MG PO TABS
2.0000 | ORAL_TABLET | Freq: Three times a day (TID) | ORAL | Status: DC
  Administered 2016-02-25 – 2016-03-01 (×17): 2 via ORAL
  Filled 2016-02-25 (×18): qty 2

## 2016-02-25 MED ORDER — PROPOFOL 500 MG/50ML IV EMUL
5.0000 ug/kg/min | Freq: Once | INTRAVENOUS | Status: DC
  Filled 2016-02-25: qty 50

## 2016-02-25 NOTE — Progress Notes (Signed)
[facility]   Reason for visit: Follow up on Strep pneumononia meningitis  Interval History: remains intubated.     Physical Exam: Constitutional:  Filed Vitals:   02/25/16 1100 02/25/16 1231  BP:    Pulse: 88 83  Temp: 98.2 F (36.8 C) 98.1 F (36.7 C)  Resp: 24 25   intubated, did open eyes to stimulation Eyes: anicteric, pinpoint pupils HENT: intubated Respiratory: Vented Cardiovascular: RRR GI: soft, nt, nd  Review of Systems: Unable to be assessed due to mental status  Lab Results  Component Value Date   WBC 14.0* 02/25/2016   HGB 8.2* 02/25/2016   HCT 25.6* 02/25/2016   MCV 93.8 02/25/2016   PLT 281 02/25/2016    Lab Results  Component Value Date   CREATININE 1.67* 02/25/2016   BUN 35* 02/25/2016   NA 150* 02/25/2016   K 4.3 02/25/2016   CL 108 02/25/2016   CO2 30 02/25/2016    Lab Results  Component Value Date   ALT 79* 02/20/2016   AST 86* 02/20/2016   ALKPHOS 45 02/20/2016     Microbiology: Recent Results (from the past 240 hour(s))  Urine culture     Status: None   Collection Time: 02/16/16 11:40 AM  Result Value Ref Range Status   Specimen Description URINE, CATHETERIZED  Final   Special Requests NONE  Final   Culture NO GROWTH 1 DAY  Final   Report Status 02/17/2016 FINAL  Final  Blood Culture (routine x 2)     Status: None   Collection Time: 02/16/16 11:53 AM  Result Value Ref Range Status   Specimen Description BLOOD RIGHT ANTECUBITAL  Final   Special Requests BOTTLES DRAWN AEROBIC AND ANAEROBIC 5CC  Final   Culture  Setup Time   Final    GRAM POSITIVE COCCI IN CHAINS IN PAIRS IN BOTH AEROBIC AND ANAEROBIC BOTTLES CRITICAL RESULT CALLED TO, READ BACK BY AND VERIFIED WITH: B WOODSON,RN @0346  02/17/16 MKELLY    Culture STREPTOCOCCUS PNEUMONIAE  Final   Report Status 02/19/2016 FINAL  Final   Organism ID, Bacteria STREPTOCOCCUS PNEUMONIAE  Final      Susceptibility   Streptococcus pneumoniae - MIC*    ERYTHROMYCIN >=8 RESISTANT Resistant     LEVOFLOXACIN 0.5 SENSITIVE Sensitive     VANCOMYCIN 0.5 SENSITIVE Sensitive     PENICILLIN 0.25 SENSITIVE Sensitive     CEFTRIAXONE 0.25 SENSITIVE Sensitive     * STREPTOCOCCUS PNEUMONIAE  Blood Culture (routine x 2)     Status: None   Collection Time: 02/16/16 12:00 PM  Result Value Ref Range Status   Specimen Description BLOOD RIGHT HAND  Final   Special Requests BOTTLES DRAWN AEROBIC AND ANAEROBIC 5CC  Final   Culture  Setup Time   Final    GRAM POSITIVE COCCI IN CHAINS IN PAIRS IN BOTH AEROBIC AND ANAEROBIC BOTTLES CRITICAL RESULT CALLED TO, READ BACK BY AND VERIFIED WITH: B WOODSON,RN @0346  02/17/16 MKELLY    Culture   Final    STREPTOCOCCUS PNEUMONIAE SUSCEPTIBILITIES PERFORMED ON PREVIOUS CULTURE WITHIN THE LAST 5 DAYS.    Report Status 02/19/2016 FINAL  Final  CSF culture with Stat gram stain     Status: None   Collection Time: 02/16/16  4:44 PM  Result Value Ref Range Status   Specimen Description CSF  Final   Special Requests NONE  Final   Gram Stain   Final    WBC PRESENT,BOTH PMN AND MONONUCLEAR GRAM POSITIVE  COCCI IN PAIRS Gram Stain Report Called to,Read Back By and Verified With: N Zonia Kief RN 1806 02/16/16 A BROWNING CYTOSPIN    Culture FEW STREPTOCOCCUS PNEUMONIAE  Final   Report Status 02/19/2016 FINAL  Final   Organism ID, Bacteria STREPTOCOCCUS PNEUMONIAE  Final      Susceptibility   Streptococcus pneumoniae - MIC*    ERYTHROMYCIN >=8 RESISTANT Resistant     LEVOFLOXACIN 0.5 SENSITIVE Sensitive     VANCOMYCIN 0.5 SENSITIVE Sensitive     PENICILLIN 0.25 RESISTANT Resistant     CEFTRIAXONE 0.25 SENSITIVE Sensitive     * FEW STREPTOCOCCUS PNEUMONIAE  MRSA PCR Screening     Status: None   Collection Time: 02/16/16 11:11 PM  Result Value Ref Range Status   MRSA by PCR NEGATIVE NEGATIVE Final    Comment:        The GeneXpert MRSA Assay (FDA approved for NASAL specimens only), is one component of a comprehensive  MRSA colonization surveillance program. It is not intended to diagnose MRSA infection nor to guide or monitor treatment for MRSA infections.   Culture, blood (x 2)     Status: None   Collection Time: 02/17/16  1:00 AM  Result Value Ref Range Status   Specimen Description BLOOD RIGHT ANTECUBITAL  Final   Special Requests   Final    BOTTLES DRAWN AEROBIC AND ANAEROBIC 4CC BLUE,5CC RED   Culture NO GROWTH 5 DAYS  Final   Report Status 02/22/2016 FINAL  Final  Culture, blood (x 2)     Status: None   Collection Time: 02/17/16  1:00 AM  Result Value Ref Range Status   Specimen Description BLOOD A-LINE  Final   Special Requests BOTTLES DRAWN AEROBIC AND ANAEROBIC 5CC   Final   Culture NO GROWTH 5 DAYS  Final   Report Status 02/22/2016 FINAL  Final  C difficile quick scan w PCR reflex     Status: None   Collection Time: 02/22/16 11:09 AM  Result Value Ref Range Status   C Diff antigen NEGATIVE NEGATIVE Final   C Diff toxin NEGATIVE NEGATIVE Final   C Diff interpretation Negative for toxigenic C. difficile  Final    Impression/Plan:  1. Strep pneumonia meningitis - stopped vancomycin due to sensitivities.  Dexa for 4 days done.   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  Has received one dose, order in to repeat x 2, 1 week apart VDRL in CSF negative  3.  Loose stools - 3 x and getting tube feeds.  Not c/w C diff.  Will stop test and d/c isolation.   I will follow up intermittently as needed.

## 2016-02-25 NOTE — Progress Notes (Signed)
Patient ID: Dale [name], male   DOB: 1966-09-13, 50 y.o.   MRN: [id number]    SUBJECTIVE:  Off dobutamine.  Marland Kitchen ECHO EF improved 50-55%. Started on carvedilol.   More responsive. Opens eyes.   Scheduled Meds: . amiodarone  200 mg Oral BID  . antiseptic oral rinse  7 mL Mouth Rinse 10 times per day  . aspirin  81 mg Oral Daily  . carvedilol  3.125 mg Oral BID WC  . cefTRIAXone (ROCEPHIN)  IV  2 g Intravenous Q12H  . chlorhexidine gluconate  15 mL Mouth Rinse BID  . folic acid  1 mg Per Tube Daily  . free water  400 mL Per Tube 6 times per day  . heparin subcutaneous  5,000 Units Subcutaneous 3 times per day  . insulin aspart  2-6 Units Subcutaneous 6 times per day  . insulin aspart  3 Units Subcutaneous 6 times per day  . insulin glargine  20 Units Subcutaneous Q24H  . isosorbide-hydrALAZINE  1 tablet Oral TID  . pantoprazole sodium  40 mg Per Tube Q24H  . penicillin g benzathine (BICILLIN-LA) IM  2.4 Million Units Intramuscular Weekly  . sodium chloride flush  10-40 mL Intracatheter Q12H  . spironolactone  25 mg Per Tube Daily  . thiamine  100 mg Per Tube Daily   Continuous Infusions: . dextrose 50 mL/hr at 02/24/16 1640  . feeding supplement (VITAL AF 1.2 CAL) 1,000 mL (02/24/16 1400)   PRN Meds:.acetaminophen (TYLENOL) oral liquid 160 mg/5 mL, albuterol, sodium chloride flush    Filed Vitals:   02/25/16 0421 02/25/16 0500 02/25/16 0600 02/25/16 0700  BP: 133/68  120/78   Pulse: 78 84 84 78  Temp:  97.9 F (36.6 C) 98.1 F (36.7 C) 98.4 F (36.9 C)  TempSrc:      Resp: Height:      Weight:      SpO2: 100% 100% 100% 100%    Intake/Output Summary (Last 24 hours) at 02/25/16 0736 Last data filed at 02/25/16 0700  Gross per 24 hour  Intake   4615 ml  Output   2625 ml  Net   1990 ml    LABS: Basic Metabolic Panel:  Recent Labs  40/98/11 0350 02/25/16 0440  NA 150* 150*  K 3.4* 4.3  CL 111 108  CO2 33* 30  GLUCOSE 127* 96  BUN 39* 35*    CREATININE 1.61* 1.67*  CALCIUM 7.6* 7.9*  MG 2.3 2.3  PHOS 5.4* 4.5   Liver Function Tests: No results for input(s): AST, ALT, ALKPHOS, BILITOT, PROT, ALBUMIN in the last 72 hours. No results for input(s): LIPASE, AMYLASE in the last 72 hours. CBC:  Recent Labs  02/24/16 0350 02/25/16 0440  WBC 13.7* 14.0*  HGB 10.5* 8.2*  HCT 32.8* 25.6*  MCV 93.4 93.8  PLT 262 281   Cardiac Enzymes: No results for input(s): CKTOTAL, CKMB, CKMBINDEX, TROPONINI in the last 72 hours. BNP: Invalid input(s): POCBNP D-Dimer: No results for input(s): DDIMER in the last 72 hours. Hemoglobin A1C: No results for input(s): HGBA1C in the last 72 hours. Fasting Lipid Panel: No results for input(s): CHOL, HDL, LDLCALC, TRIG, CHOLHDL, LDLDIRECT in the last 72 hours. Thyroid Function Tests: No results for input(s): TSH, T4TOTAL, T3FREE, THYROIDAB in the last 72 hours.  Invalid input(s): FREET3 Anemia Panel: No results for input(s): VITAMINB12, FOLATE, FERRITIN, TIBC, IRON, RETICCTPCT in the last 72 hours.  RADIOLOGY: Dg Chest 2 View  02/13/2016  CLINICAL DATA:  50 year old male with history of anterior chest pain for the past 2 days. Low-grade fever. Dizziness. EXAM: CHEST  2 VIEW COMPARISON:  Chest x-ray 06/01/2009. FINDINGS: Lung volumes are normal. No consolidative airspace disease. No pleural effusions. No pneumothorax. No pulmonary nodule or mass noted. Pulmonary vasculature and the cardiomediastinal silhouette are within normal limits. IMPRESSION: No radiographic evidence of acute cardiopulmonary disease. Electronically Signed   By: Trudie Reedaniel  Entrikin M.D.   On: 02/13/2016 17:57   Dg Thoracic Spine 2 View  02/13/2016  CLINICAL DATA:  O thoracic and lower back pain for 1 week. Fall 2 days ago. Pain was before the fall. EXAM: THORACIC SPINE 2 VIEWS COMPARISON:  Chest x-ray today and 06/01/2009 FINDINGS: There is no evidence of thoracic spine fracture. Alignment is normal. No other significant bone  abnormalities are identified. IMPRESSION: Negative. Electronically Signed   By: Charlett NoseKevin  Dover M.D.   On: 02/13/2016 20:45   Dg Lumbar Spine Complete  02/13/2016  CLINICAL DATA:  Low back pain and recent fall. EXAM: LUMBAR SPINE - COMPLETE 4+ VIEW COMPARISON:  08/25/2014 lumbar spine radiographs. FINDINGS: This report assumes 5 non rib-bearing lumbar vertebrae. Lumbar vertebral body heights are preserved, with no fracture. There is mild loss of disc height at L4-5 with associated minimal spondylosis, not appreciably changed. Otherwise preserved lumbar disc spaces. There is new 3 mm anterolisthesis at L4-5. No appreciable facet arthropathy. No appreciable foraminal stenosis. No aggressive appearing focal osseous lesions. IMPRESSION: 1. No lumbar spine fracture. 2. Stable mild degenerative disc disease at L4-5. 3. New mild 3 mm anterolisthesis at L4-5. Electronically Signed   By: Delbert PhenixJason A Poff M.D.   On: 02/13/2016 20:47   Ct Head Wo Contrast  02/16/2016  CLINICAL DATA:  Fever and altered mental status; tremors EXAM: CT HEAD WITHOUT CONTRAST TECHNIQUE: Contiguous axial images were obtained from the base of the skull through the vertex without intravenous contrast. COMPARISON:  June 01, 2009 FINDINGS: The ventricles are mildly enlarged in a generalized manner and appear larger compared to the prior study. The sulci appear unremarkable. There is no intracranial mass, hemorrhage, extra-axial fluid collection, or midline shift. No focal gray-white compartment lesions are identified. No evidence of acute infarct. Bony calvarium appears intact. The mastoid air cells are clear. There is opacification in the left maxillary antrum as well as in multiple ethmoid air cells bilaterally. There is mild mucosal thickening in the left sphenoid sinus region. No intraorbital lesions are apparent. IMPRESSION: Ventricles mildly enlarged with sulci appearing normal. This appearance potentially could represent a degree of normal  pressure hydrocephalus. This finding also could be secondary to prior or recent meningitis meningitis. No intracranial mass, hemorrhage, or extra-axial fluid collection. No acute infarct evident. Multifocal sinusitis. Given these current findings, brain MRI pre and post-contrast may be helpful to further assess, in particular to further evaluate the meningeal regions. Electronically Signed   By: Bretta BangWilliam  Woodruff III M.D.   On: 02/16/2016 14:04   Dg Chest Port 1 View  02/25/2016  CLINICAL DATA:  Intubated patient, encephalopathy and meningitis, septic shock, CHF. EXAM: PORTABLE CHEST 1 VIEW COMPARISON:  Portable chest x-ray of February 24, 2016 FINDINGS: The lungs are well-expanded. There is persistent left lower lobe atelectasis or pneumonia and trace left pleural effusion. The right lung is clear. The heart and pulmonary vascularity are normal. The endotracheal tube tip lies approximately 2 cm above the carina. The esophagogastric tube tip projects below the inferior margin of the image. IMPRESSION: Somewhat  low positioning of the endotracheal tube with. Withdrawal by 2 cm is recommended to avoid accidental mainstem bronchus intubation. Stable left lower lobe atelectasis and small left pleural effusion. Electronically Signed   By: David  [place] M.D.   On: 02/25/2016 07:24   Dg Chest Port 1 View  02/24/2016  CLINICAL DATA:  Endotracheal tube assessment. EXAM: PORTABLE CHEST 1 VIEW COMPARISON:  02/23/2016 FINDINGS: Endotracheal tube is present with tip measuring 2.5 cm above the carinal. Enteric tube tip is off the field of view but below the left hemidiaphragm. Shallow inspiration. Heart size and pulmonary vascularity are normal. Mild serration of left hemidiaphragm suggesting infiltration and/ or effusion. No pneumothorax. IMPRESSION: Endotracheal tube tip measures 2.5 cm above the carina. Infiltration and/ or effusion in the left lung base. Electronically Signed   By: Burman Nieves M.D.   On: 02/24/2016  01:34   Dg Chest Port 1 View  02/23/2016  CLINICAL DATA:  Shortness of breath. EXAM: PORTABLE CHEST 1 VIEW COMPARISON:  02/22/2016. FINDINGS: Endotracheal tube tip 1.8 cm above the carina, slight proximal repositioning of 1 a 2 cm should be considered. NG tube noted with tip below left hemidiaphragm. Heart size stable. Bilateral pulmonary infiltrates, left side greater right noted. Small left pleural effusion cannot be excluded. No pneumothorax . IMPRESSION: 1. Endotracheal tube tip 1.8 cm above the carina, slight proximal repositioning of 1 a 2 cm should be considered. NG tube in stable position. 2. Diffuse left lung and right lower lobe pulmonary infiltrates noted. These are new from prior exam. Small left pleural effusion cannot be excluded . Electronically Signed   By: Maisie Fus  Register   On: 02/23/2016 07:31   Dg Chest Port 1 View  02/22/2016  CLINICAL DATA:  Respiratory failure. EXAM: PORTABLE CHEST 1 VIEW COMPARISON:  02/21/2016. FINDINGS: Endotracheal tube, NG tube, right IJ sheath in stable position. Interim removal Swan-Ganz catheter. Heart size stable. Low lung volumes with mild bibasilar atelectasis and or infiltrates. Interim improvement from prior exam. Small left pleural effusion cannot be excluded. No pneumothorax. IMPRESSION: 1. Level of Swan-Ganz catheter. Endotracheal tube, NG tube, right IJ sheath in stable position. 2. Low lung volumes with mild bibasilar atelectasis and/or infiltrates. Interim improvement from prior exam. Small left pleural effusion cannot be excluded. Electronically Signed   By: Maisie Fus  Register   On: 02/22/2016 07:13   Dg Chest Port 1 View  02/21/2016  CLINICAL DATA:  Respiratory failure EXAM: PORTABLE CHEST 1 VIEW COMPARISON:  Chest radiograph from one day prior. FINDINGS: Right internal jugular Swan-Ganz catheter terminates in the right infrahilar region. Enteric tube enters the stomach with the tip not seen on this image. Endotracheal tube tip is 2.5 cm above the  carina. Stable cardiomediastinal silhouette with normal heart size. No pneumothorax. Stable small left pleural effusion. No pulmonary edema. Stable left lower lobe consolidation. IMPRESSION: 1. Well-positioned endotracheal tube. Right internal jugular Swan-Ganz catheter terminates over the right infrahilar region, consider retracting 2-3 cm. 2. Stable small left pleural effusion. 3. Stable left lower lobe consolidation, favor atelectasis. Electronically Signed   By: Delbert Phenix M.D.   On: 02/21/2016 08:00   Dg Chest Port 1 View  02/20/2016  CLINICAL DATA:  CHF EXAM: PORTABLE CHEST 1 VIEW COMPARISON:  Chest radiograph from one day prior. FINDINGS: Right internal jugular Swan-Ganz catheter terminates over the central right lower lung, unchanged. Endotracheal tube tip is 2.4 cm above the carina. Enteric tube enters the stomach with the tip not seen on this image. Intra-aortic balloon  pump marker terminates over the proximal descending thoracic aorta at the T7-8 level. Stable cardiomediastinal silhouette with normal heart size. No pneumothorax. No pleural effusion. Hazy opacities throughout the bilateral parahilar lungs are not appreciably changed. Probable complete left lower lobe atelectasis, unchanged. IMPRESSION: 1. Right internal jugular Swan-Ganz catheter terminates over the central lower right lung, consider retracting 3-4 cm . 2. Otherwise well-positioned support structures as described. 3. Stable hazy opacities throughout the parahilar lungs, favor pulmonary edema. Electronically Signed   By: Delbert Phenix M.D.   On: 02/20/2016 09:34   Dg Chest Port 1 View  02/19/2016  CLINICAL DATA:  CHF EXAM: PORTABLE CHEST 1 VIEW COMPARISON:  02/18/2016 FINDINGS: Support devices remain in place, unchanged. Heart is normal size. Diffuse interstitial and alveolar opacities are again noted, likely edema. This is somewhat more confluent in both lung bases, left greater than right. Cannot completely exclude infiltrate at  the left base. No visible effusions. No acute bony abnormality. IMPRESSION: Continued diffuse interstitial and alveolar opacities, likely edema. Somewhat more focal opacities in the lung bases, left greater than right. Cannot exclude pneumonia. Electronically Signed   By: Charlett Nose M.D.   On: 02/19/2016 08:29   Dg Chest Port 1 View  02/18/2016  CLINICAL DATA:  Entry aortic balloon pump asist. EXAM: PORTABLE CHEST 1 VIEW COMPARISON:  February 17, 2016. FINDINGS: The heart size and mediastinal contours are within normal limits. No pneumothorax or pleural effusion is noted. Endotracheal nasogastric tubes are unchanged in position. Right internal jugular Swan-Ganz catheter is unchanged with distal tip in expected position of right pulmonary artery. Distal tip of aortic balloon catheter is seen over expected position of superior portion of descending thoracic aorta just below aortic knob. Stable bilateral perihilar opacities are noted concerning for edema or inflammation. The visualized skeletal structures are unremarkable. IMPRESSION: Distal tip of aortic balloon catheter appears to be slightly more inferior in position compared to prior exam, just below aortic knob currently. This projects over the expected position of the superior aspect of descending thoracic aorta. Stable bilateral perihilar opacities are noted concerning for edema or inflammation. Otherwise stable support apparatus. Electronically Signed   By: Lupita Raider, M.D.   On: 02/18/2016 07:24   Dg Chest Port 1 View  02/17/2016  CLINICAL DATA:  Ventilator dependence. EXAM: PORTABLE CHEST 1 VIEW COMPARISON:  Earlier the same day. FINDINGS: 1822 hours. Endotracheal tube tip is 7.4 cm above the base of the carina. Right IJ pulmonary artery catheter tip is in the interlobar pulmonary artery. The tip of the intra-aortic balloon pump projects over the inferior aspect of the transverse aorta. The NG tube passes into the stomach although the distal tip  position is not included on the film. Proximal port of the NG tube is at or just above the EG junction. Diffuse interstitial and central alveolar opacities suggest edema. Telemetry leads overlie the chest. IMPRESSION: The tip of the intra-aortic balloon pump projects over the inferior aspect of the transverse aorta. Electronically Signed   By: Kennith Center M.D.   On: 02/17/2016 18:34   Dg Chest Port 1 View  02/17/2016  CLINICAL DATA:  50 year old male with a history of Swan-Ganz catheter placement. EXAM: PORTABLE CHEST 1 VIEW COMPARISON:  02/17/2016 FINDINGS: Cardiomediastinal silhouette unchanged in size and contour. Skull pads again project over the thorax. Unchanged endotracheal tube, gastric tube. Interval placement of right IJ sheath, through which a Swan-Ganz catheter has been placed terminating in the right pulmonary arteries. Persisting bilateral airspace  opacity IMPRESSION: Interval placement of right IJ sheath, through which a Swan-Ganz catheter has been placed and terminates in the right pulmonary arteries at the hilum. Similar appearance of bilateral airspace opacities, potentially infection and/or edema. Unchanged support apparatus as above. Signed, Yvone Neu. Loreta Ave, DO [facility] Electronically Signed   By: Gilmer Mor D.O.   On: 02/17/2016 13:53   Dg Chest Port 1 View  02/17/2016  CLINICAL DATA:  Hypoxia EXAM: PORTABLE CHEST 1 VIEW COMPARISON:  February 16, 2016 FINDINGS: Endotracheal tube tip is 3.5 cm above the carina. Nasogastric tube tip and side port are in the stomach. No pneumothorax evident. There is interstitial edema throughout the lungs bilaterally, perhaps slightly increased from 1 day prior. No airspace consolidation. Heart is upper normal in size with pulmonary vascularity within normal limits. No adenopathy. IMPRESSION: Tube positions as described without pneumothorax. Slight increase in interstitial edema. No airspace  consolidation. No change in cardiac silhouette. Electronically Signed   By: Bretta Bang III M.D.   On: 02/17/2016 07:31   Dg Chest Portable 1 View  02/16/2016  CLINICAL DATA:  Endotracheal tube placement EXAM: PORTABLE CHEST 1 VIEW COMPARISON:  Chest x-ray from earlier same day. FINDINGS: Endotracheal tube well positioned with tip approximately 2 cm above the carina. Enteric tube passes below the diaphragm. Heart size is normal. There is central pulmonary vascular congestion and mild interstitial edema. Suspect small left pleural effusion. No pneumothorax. Osseous structures about the chest are unremarkable. IMPRESSION: 1. Endotracheal tube well positioned with tip approximately 2 cm above the carina. 2. Central pulmonary vascular congestion and mild bilateral interstitial edema suggesting mild volume overload/resuscitation efforts. No frank alveolar pulmonary edema. Electronically Signed   By: Bary Richard M.D.   On: 02/16/2016 19:38   Dg Chest Port 1 View  02/16/2016  CLINICAL DATA:  Altered mental status.  Depression. EXAM: PORTABLE CHEST 1 VIEW COMPARISON:  02/13/2016. FINDINGS: Midline trachea. Normal heart size and mediastinal contours. Left costophrenic angle exclusion. No pleural effusion or pneumothorax. Clear lungs. IMPRESSION: No active disease. Electronically Signed   By: Jeronimo Greaves M.D.   On: 02/16/2016 13:48   Ct Portable Head W/o Cm  02/17/2016  CLINICAL DATA:  50 year old male with a history of hydrocephalus. Acute meningitis. EXAM: CT HEAD WITHOUT CONTRAST TECHNIQUE: Contiguous axial images were obtained from the base of the skull through the vertex without intravenous contrast. COMPARISON:  02/16/2016 FINDINGS: Unremarkable appearance of the calvarium without acute fracture or aggressive lesion. Unremarkable appearance of the scalp soft tissues. Unremarkable appearance of the bilateral orbits. Partially visualized endotracheal tube.  Oral enteric tube. Near complete opacification  of the left maxillary sinus with frothy secretions. Trace secretions of the right maxillary sinus. Mucoperiosteal thickening of the ethmoid air cells and the sphenoid sinuses. Trace disease of the frontal sinuses. Mastoid air cells are clear. Layered fluid within the nasopharynx. Configuration the ventricles is similar to comparison with bifrontal diameter measuring 40 mm, unchanged from prior. Unchanged appearance of the hemispheric sulci. No acute intracranial hemorrhage.  No midline shift. Gray-white differentiation relatively maintained. IMPRESSION: Unchanged ventricular dilation and appearance of the cerebral sulci, with no evidence of acute hemorrhage or infarction. Signed, Yvone Neu. Loreta Ave, DO [facility] Electronically Signed   By: Gilmer Mor D.O.   On: 02/17/2016 07:23    PHYSICAL EXAM General: Intubated Neck: JVP not elevated, no thyromegaly or thyroid nodule. RIJ  Lungs: Course rackles dependently bilaterally.  CV:  Nondisplaced PMI.  Heart regular S1/S2, no S3, no murmur.  No peripheral edema.  Abdomen: Soft, no hepatosplenomegaly, no distention.  Neurologic: Intubated. Eyes open Tacking with eyes.  Extremities: No clubbing or cyanosis.  GU: Foley.   TELEMETRY: Reviewed telemetry pt in NSR in 80s  ASSESSMENT AND PLAN: 50 yo with minimal past medical history was admitted with confusion, prior URI symptoms.  Had LP concerning for meningitis and found to have strep bacteremia/meningitis.  Later developed shock requiring pressors and was intubated for airway protection.  Bedside echo showed EF about 10%.  Swan placed, noted cardiogenic shock and had IABP.   1. Shock: Primarily appears to have cardiogenic shock.  Cardiac output much improved, IABP out 3/18.   CO-OX 70%. Off dobutamine.  2. Acute hypoxemic respiratory failure: CXR looked like pulmonary edema.   3. ID: Strep bacteremia with meningitis.  On ceftriaxone. WBC 14.  Completed  with 4 days of dexamethasone.  Also treating for syphilis.  4. AKI: Creatinine stable.  5. Acute systolic CHF: EF 45-40% on 3/16 echo.  Repeat ECHO with EF 50-55%.  - Continue spironolactone - Increase bidil 2 tab tid. - Continue Coreg 3.125 mg bid.  6. Elevated LFTs: Suspect shock liver. LFTs improved.  7. Cardiac arrest: PEA arrest then ventricular fibrillation in ER.  Now on po amiodarone in NSR.  8. Neuro: Eyes open. Tracking with eyes.   9. Hypernatremia:  Sodium 150 . Continue  free water boluses.  CCM planning family meeting today.     Amy Clegg NP-C  02/25/2016 7:36 AM   Patient seen with NP, agree with the above note.  His EF has recovered by echo to 50-55% and hemodynamics are good.  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   - Continue cardiac regimen with Bidil, spironolactone, and Coreg.   Issue now will be recovery from encephalopathy.   Marca Ancona 02/25/2016 8:14 AM

## 2016-02-25 NOTE — Progress Notes (Signed)
Nutrition Follow-up  INTERVENTION:   Continue Vital AF 1.2 @ 65 ml/hr Provides: 1872 kcal (105% of needs), 117 grams, and 1265 ml H2O.  Total free water: 3665 ml  NUTRITION DIAGNOSIS:   Inadequate oral intake related to inability to eat as evidenced by NPO status. Ongoing.   GOAL:   Patient will meet greater than or equal to 90% of their needs Met.   MONITOR:   TF tolerance, Weight trends, Vent status, Labs  ASSESSMENT:   50 yo homeless male smoker with hx of ETOH developed upper respiratory symptoms. Developed progressive confusion, lactic acidosis, AKI. 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 Suffered cardiac arrest, cardiogenic shock with acute systolic CHF, ARDS 2/44.  Patient is currently intubated on ventilator support MV: 9 L/min Temp (24hrs), Avg:98.5 F (36.9 C), Min:97.9 F (36.6 C), Max:99.5 F (37.5 C)  3/18 IABP removed  3/23 Family meeting possible extubation tomorrow vs trach  Medications reviewed and include: folic acid, thiamine Free water: 400 ml 6 times per day: 2400 ml  OG tube Labs reviewed: sodium elevated 150, Potassium, magnesium, and phosphorus are WNL CBG's: 116  Diet Order:  Diet NPO time specified Diet NPO time specified  Skin:  Reviewed, no issues  Last BM:  3/22 150 ml via rectal tube pouch  Height:   Ht Readings from Last 1 Encounters:  02/16/16 5' 8.9" (1.75 m)    Weight:   Wt Readings from Last 1 Encounters:  02/25/16 146 lb 6.2 oz (66.4 kg)    Ideal Body Weight:  72.7 kg  BMI:  Body mass index is 21.68 kg/(m^2).  Estimated Nutritional Needs:   Kcal:  6950  Protein:  105-115 grams   Fluid:  1.8 L  EDUCATION NEEDS:   No education needs identified at this time  [place], [place], [place] Pager [phone] After Hours Pager

## 2016-02-25 NOTE — Progress Notes (Signed)
PULMONARY / CRITICAL CARE MEDICINE   Name: MAYJOR AGER MRN: [id number] DOB: [birth year]    ADMISSION DATE:  02/16/2016   CONSULTATION DATE:  02/16/16  REFERRING MD:  [place] Pink  CHIEF COMPLAINT:  AMS   STUDIES:  3/14 CT head > multifocal sinusitis, enlarged ventricles 3/14 LP >> glucose < 20, protein > 600, WBC 340 (88%N), 89 RBC  CULTURES: 3/14 Blood > strep pneumo 3/14 Urine >neg 3/14 CSF > strep pneumo   ANTIBIOTICS: 3/14 Ampicillin >> 3/14 3/14 Vancomycin >>off 3/14 Rocephin >>  LINES/TUBES: 3/14 ETT >> 3/15 Rt femoral CVL >> 3/15 Rt femoral aline >> 3/15 Rt IJ introducer >> out   SIGNIFICANT EVENTS: 3/14 Admit, VDRF, ID/cardiology/neurology consulted 3/15 Cardiac arrest, 5 mins, Asystole > VF > ST; ARDS, septic/cardiogenic shock >> IABP; start paralytic 3/16 - Remains on paralytic, pressors, inotropes. 3/18 - IABP to be removed 3/18 stopping nimbex  SUBJECTIVE/OVERNIGHT/INTERVAL HX Tracking, sticks out tongue.    VITAL SIGNS: BP 124/79 mmHg  Pulse 88  Temp(Src) 98.6 F (37 C) (Core (Comment))  Resp 21  Ht 5' 8.9" (1.75 m)  Wt 66.4 kg (146 lb 6.2 oz)  BMI 21.68 kg/m2  SpO2 99%  HEMODYNAMICS:    VENTILATOR SETTINGS: Vent Mode:  [-] PSV;CPAP FiO2 (%):  [30 %] 30 % Set Rate:  [16 bmp] 16 bmp Vt Set:  [410 mL] 410 mL PEEP:  [5 cmH20] 5 cmH20 Pressure Support:  [10 cmH20] 10 cmH20 Plateau Pressure:  [13 cmH20-14 cmH20] 13 cmH20  INTAKE / OUTPUT: I/O last 3 completed shifts: In: 7255 [I.V.:1710; NG/GT:5395; IV Piggyback:150] Out: 1007 [Urine:3325; Stool:150]  PHYSICAL EXAMINATION: General: chronically ill appearing male, NAD on vent Neuro: Off sedation, tracked, stuck out tongue for RN this am, now not following commands but resists pupil check HEENT: [place]/AT, PERRL, ETT Cardiac: RRR, Nl S1/S2, -M/R/G. Chest: resps even non labored on PS 10/5, few scattered rhonchi  Abd: Soft, NT, ND and +BS. Ext: Intact.   LABS:  PULMONARY  Recent  Labs Lab 02/20/16 0405  02/21/16 0349  02/23/16 0300 02/24/16 0116 02/24/16 0411 02/25/16 0426 02/25/16 0430  PHART 7.420  --  7.449  --  7.531* 7.527*  --  7.479*  --   PCO2ART 45.2*  --  46.7*  --  36.3 37.4  --  41.4  --   PO2ART 84.7  --  89.3  --  132* 131*  --  123*  --   HCO3 28.8*  --  31.9*  --  30.3* 30.9*  --  30.4*  --   TCO2 30.2  --  33.4  --  31.4 32.1  --  31.7  --   O2SAT 94.8  < > 96.7  < > 98.5 98.4 73.6 98.2 70.2  < > = values in this interval not displayed.  CBC  Recent Labs Lab 02/23/16 0316 02/24/16 0350 02/25/16 0440  HGB 10.7* 10.5* 8.2*  HCT 33.4* 32.8* 25.6*  WBC 13.3* 13.7* 14.0*  PLT 281 262 281   COAGULATION  Recent Labs Lab 02/21/16 0350  INR 1.13   CARDIAC No results for input(s): TROPONINI in the last 168 hours. No results for input(s): PROBNP in the last 168 hours.  CHEMISTRY  Recent Labs Lab 02/21/16 0445 02/22/16 0300 02/23/16 0316 02/24/16 0350 02/25/16 0440  NA 153* 155* 157* 150* 150*  K 4.0 4.0 3.3* 3.4* 4.3  CL 114* 114* 112* 111 108  CO2 '31 31 30 '$ 33* 30  GLUCOSE 140* 134* 109* 127*  96  BUN 44* 46* 45* 39* 35*  CREATININE 1.58* 1.56* 1.58* 1.61* 1.67*  CALCIUM 7.4* 7.5* 7.8* 7.6* 7.9*  MG 2.5* 2.6* 2.3 2.3 2.3  PHOS 2.8 2.5 3.0 5.4* 4.5   Estimated Creatinine Clearance: 49.1 mL/min (by C-G formula based on Cr of 1.67).  LIVER  Recent Labs Lab 02/19/16 0400 02/20/16 0400 02/21/16 0350  AST 142* 86*  --   ALT 83* 79*  --   ALKPHOS 62 45  --   BILITOT 0.3 0.4  --   PROT 5.0* 5.1*  --   ALBUMIN 1.5* 1.6*  --   INR  --   --  1.13   INFECTIOUS  Recent Labs Lab 02/20/16 1630  LATICACIDVEN 1.1   ENDOCRINE CBG (last 3)   Recent Labs  02/24/16 2354 02/25/16 0436 02/25/16 0753  GLUCAP 114* 85 116*   IMAGING x48h Dg Chest Port 1 View  02/25/2016  CLINICAL DATA:  Intubated patient, encephalopathy and meningitis, septic shock, CHF. EXAM: PORTABLE CHEST 1 VIEW COMPARISON:  Portable chest x-ray of  February 24, 2016 FINDINGS: The lungs are well-expanded. There is persistent left lower lobe atelectasis or pneumonia and trace left pleural effusion. The right lung is clear. The heart and pulmonary vascularity are normal. The endotracheal tube tip lies approximately 2 cm above the carina. The esophagogastric tube tip projects below the inferior margin of the image. IMPRESSION: Somewhat low positioning of the endotracheal tube with. Withdrawal by 2 cm is recommended to avoid accidental mainstem bronchus intubation. Stable left lower lobe atelectasis and small left pleural effusion. Electronically Signed   By: David  Martinique M.D.   On: 02/25/2016 07:24   Dg Chest Port 1 View  02/24/2016  CLINICAL DATA:  Endotracheal tube assessment. EXAM: PORTABLE CHEST 1 VIEW COMPARISON:  02/23/2016 FINDINGS: Endotracheal tube is present with tip measuring 2.5 cm above the carinal. Enteric tube tip is off the field of view but below the left hemidiaphragm. Shallow inspiration. Heart size and pulmonary vascularity are normal. Mild serration of left hemidiaphragm suggesting infiltration and/ or effusion. No pneumothorax. IMPRESSION: Endotracheal tube tip measures 2.5 cm above the carina. Infiltration and/ or effusion in the left lung base. Electronically Signed   By: Lucienne Capers M.D.   On: 02/24/2016 01:34   50 yo homeless male smoker with hx of ETOH developed upper respiratory symptoms.  Developed progressive confusion, lactic acidosis, AKI. 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  Suffered cardiac arrest, cardiogenic shock with acute systolic CHF, ARDS 1/95.  ASSESSMENT / PLAN:  PULMONARY A:  Acute respiratory failure 2nd to ARDS, acute pulmonary edema. P:   F/u CXR Continue PS trials as able but mental status still needs sig improvement prior to extubation.   CARDIOVASCULAR A:  Septic/cardiogenic shock >> mostly cardiogenic now. Acute systolic CHF. Cardiac arrest 3/15. NSTEMI. P:  Hold diureses for now. Cards  medications as needed. IABP out.  RENAL A:   AKI 2nd to ATN. Hypokalemia Hypernatremia P:   Follow BMP Replace electrolytes as indicated Free water 400 ml q6 hours. Increase D5W to 31m/hr.  GASTROINTESTINAL A:   Nutrition. Shock liver. P:   Tube feeds. Protonix. F/u LFTs.  HEMATOLOGIC A:   Leukocytosis. P:  F/u CBC  INFECTIOUS A:   Streptococcal bacteremia and meningitis. RPR positive. P:   Day 10 ceftriaxone, decadron per ID Pen G 2.4 million units IM weekly.  ENDOCRINE CBG (last 3)  A:   Hyperglycemia. Relative adrenal insufficiency >> cortisol 15.2 from 3/15. P:  SSI, lantus   NEUROLOGIC A:   Acute metabolic encephalopathy. Hx of ETOH, THC. Streptococcal meningitis  P:   Thiamine, folic acid Continue off sedation- follow MS. Repeat imaging if he isn't starting to clear MS  AUTOIMMUNE A - no prior hx of autoimmune diz P - Anca neg, ana pos, ds-dna neg results, speckled pattern noticed.  FAMILY Family meeting scheduled today 3/23  Nickolas Madrid, NP 02/25/2016  10:40 AM Pager: [phone] or [phone]  Attending Note:  50 year old homeless male who suffered a cardiac arrest after a bout of meningitis and suffered brain injury.  Per family and RN patient is following commands but is refusing to follow commands for me on exam.  While he is weaning, I do not feel comfortable extubating this patient with this level of mental status.  Given that, met with the family and after extensive discussion (their ability to comprehend complex situations such as this were rather limited) decision was made to wean throughout the day and in AM if better extubate, if not better then will proceed with tracheostomy.  The patient is critically ill with multiple organ systems failure and requires high complexity decision making for assessment and support, frequent evaluation and titration of therapies, application of advanced monitoring technologies and  extensive interpretation of multiple databases.   Critical Care Time devoted to patient care services described in this note is  35  Minutes. This time reflects time of care of this signee Dr Jennet Maduro. This critical care time does not reflect procedure time, or teaching time or supervisory time of PA/NP/Med student/Med Resident etc but could involve care discussion time.  Rush Farmer, M.D. [facility] Pulmonary/Critical Care Medicine. Pager: [phone]. After hours pager: [phone].

## 2016-02-26 ENCOUNTER — Inpatient Hospital Stay (HOSPITAL_COMMUNITY): Payer: Medicaid Other

## 2016-02-26 DIAGNOSIS — J9601 Acute respiratory failure with hypoxia: Secondary | ICD-10-CM | POA: Insufficient documentation

## 2016-02-26 LAB — CBC
HEMATOCRIT: 29.8 % — AB (ref 39.0–52.0)
Hemoglobin: 9.3 g/dL — ABNORMAL LOW (ref 13.0–17.0)
MCH: 29.2 pg (ref 26.0–34.0)
MCHC: 31.2 g/dL (ref 30.0–36.0)
MCV: 93.7 fL (ref 78.0–100.0)
PLATELETS: 258 10*3/uL (ref 150–400)
RBC: 3.18 MIL/uL — AB (ref 4.22–5.81)
RDW: 14.8 % (ref 11.5–15.5)
WBC: 12.2 10*3/uL — AB (ref 4.0–10.5)

## 2016-02-26 LAB — BASIC METABOLIC PANEL
ANION GAP: 10 (ref 5–15)
BUN: 30 mg/dL — ABNORMAL HIGH (ref 6–20)
CALCIUM: 8.1 mg/dL — AB (ref 8.9–10.3)
CO2: 31 mmol/L (ref 22–32)
CREATININE: 1.73 mg/dL — AB (ref 0.61–1.24)
Chloride: 107 mmol/L (ref 101–111)
GFR, EST AFRICAN AMERICAN: 51 mL/min — AB (ref >60)
GFR, EST NON AFRICAN AMERICAN: 44 mL/min — AB (ref >60)
GLUCOSE: 62 mg/dL — AB (ref 65–99)
Potassium: 3.8 mmol/L (ref 3.5–5.1)
Sodium: 148 mmol/L — ABNORMAL HIGH (ref 135–145)

## 2016-02-26 LAB — GLUCOSE, CAPILLARY
GLUCOSE-CAPILLARY: 110 mg/dL — AB (ref 65–99)
GLUCOSE-CAPILLARY: 92 mg/dL (ref 65–99)
GLUCOSE-CAPILLARY: 118 mg/dL — AB (ref 65–99)
Glucose-Capillary: 58 mg/dL — ABNORMAL LOW (ref 65–99)
Glucose-Capillary: 69 mg/dL (ref 65–99)
Glucose-Capillary: 96 mg/dL (ref 65–99)
Glucose-Capillary: 99 mg/dL (ref 65–99)

## 2016-02-26 LAB — CARBOXYHEMOGLOBIN
CARBOXYHEMOGLOBIN: 1.6 % — AB (ref 0.5–1.5)
METHEMOGLOBIN: 0.8 % (ref 0.0–1.5)
O2 SAT: 73.3 %
Total hemoglobin: 9.7 g/dL — ABNORMAL LOW (ref 13.5–18.0)

## 2016-02-26 MED ORDER — SODIUM CHLORIDE 0.9% FLUSH
10.0000 mL | Freq: Two times a day (BID) | INTRAVENOUS | Status: DC
  Administered 2016-02-26 – 2016-02-29 (×6): 10 mL

## 2016-02-26 MED ORDER — CARVEDILOL 6.25 MG PO TABS
6.2500 mg | ORAL_TABLET | Freq: Two times a day (BID) | ORAL | Status: DC
  Administered 2016-02-26 – 2016-03-01 (×10): 6.25 mg via ORAL
  Filled 2016-02-26 (×10): qty 1

## 2016-02-26 MED ORDER — DEXTROSE 50 % IV SOLN: 25.0000 mL | Freq: Once | INTRAVENOUS | Status: AC

## 2016-02-26 MED ORDER — DEXTROSE 50 % IV SOLN
INTRAVENOUS | Status: AC
  Administered 2016-02-26: 25 mL
  Filled 2016-02-26: qty 50

## 2016-02-26 MED ORDER — SODIUM CHLORIDE 0.9% FLUSH: 10.0000 mL | INTRAVENOUS | Status: DC | PRN

## 2016-02-26 NOTE — Procedures (Signed)
Percutaneous Tracheostomy Placement  Consent from family.  Patient sedated, paralyzed and position.  Placed on 100% FiO2 and RR matched.  Area cleaned and draped.  Lidocaine/epi injected.  Skin incision done followed by blunt dissection.  Trachea palpated then punctured, catheter passed and visualized bronchoscopically.  Wire placed and visualized.  Catheter removed.  Airway then crushed and dilated.  Size 6 cuffed shiley trach placed and visualized bronchoscopically well above carina.  Good volume returns.  Patient tolerated the procedure well without complications.  Minimal blood loss.  CXR ordered and pending.  [name], M.D. [place] Pulmonary/Critical Care Medicine. Pager: [phone]. After hours pager: [phone]. 

## 2016-02-26 NOTE — Procedures (Signed)
Bedside Tracheostomy Insertion Procedure Note   Patient Details:   Name: Dale [name] DOB: 12/08/1965 MRN: [id number]  Procedure: Tracheostomy  Pre Procedure Assessment: ET Tube Size:8.0 ET Tube secured at lip (cm):25 Bite block in place:  Breath Sounds: bilateral Post Procedure Assessment: BP 102/56 mmHg  Pulse 77  Temp(Src) 96.4 F (35.8 C) (Core (Comment))  Resp 20  Ht 5' 8.9" (1.75 m)  Wt 145 lb 1 oz (65.8 kg)  BMI 21.49 kg/m2  SpO2 100% O2 sats: 100% Complications: none Patient did tolerate procedure well Tracheostomy Brand:shiley Tracheostomy Style:cuffed Tracheostomy Size: 6.0 Tracheostomy Secured WUJ:[id number]:sutures and velcro ties Tracheostomy Placement Confirmation:tracheostomy visualized via video bronchoscopy    Shanda BumpsBrewer, Fynn Vanblarcom Faye 02/26/2016, 3:35 PM

## 2016-02-26 NOTE — Progress Notes (Signed)
PULMONARY / CRITICAL CARE MEDICINE   Name: Dale [name] MRN: [id number] DOB: 1966/06/19    ADMISSION DATE:  02/16/2016   CONSULTATION DATE:  02/16/16  REFERRING MD:  Rito Ehrlich  CHIEF COMPLAINT:  AMS   STUDIES:  3/14 CT head > multifocal sinusitis, enlarged ventricles 3/14 LP >> glucose < 20, protein > 600, WBC 340 (88%N), 89 RBC  CULTURES: 3/14 Blood > strep pneumo 3/14 Urine >neg 3/14 CSF > strep pneumo  ANTIBIOTICS: 3/14 Ampicillin >> 3/14 3/14 Vancomycin >>off 3/14 Rocephin >> Pen G weekly.  LINES/TUBES: 3/14 ETT >> 3/15 Rt femoral CVL >> 3/15 Rt femoral aline >> 3/15 Rt IJ introducer >> out   SIGNIFICANT EVENTS: 3/14 Admit, VDRF, ID/cardiology/neurology consulted 3/15 Cardiac arrest, 5 mins, Asystole > VF > ST; ARDS, septic/cardiogenic shock >> IABP; start paralytic 3/16 - Remains on paralytic, pressors, inotropes. 3/18 - IABP to be removed 3/18 stopping nimbex  SUBJECTIVE/OVERNIGHT/INTERVAL HX Tracking, sticks out tongue.  Following some commands.   VITAL SIGNS: BP 102/56 mmHg  Pulse 83  Temp(Src) 98.1 F (36.7 C) (Core (Comment))  Resp 29  Ht 5' 8.9" (1.75 m)  Wt 65.8 kg (145 lb 1 oz)  BMI 21.49 kg/m2  SpO2 99%  HEMODYNAMICS:    VENTILATOR SETTINGS: Vent Mode:  [-] CPAP;PSV FiO2 (%):  [30 %] 30 % Set Rate:  [16 bmp] 16 bmp Vt Set:  [410 mL] 410 mL PEEP:  [5 cmH20] 5 cmH20 Pressure Support:  [5 cmH20] 5 cmH20 Plateau Pressure:  [13 cmH20-18 cmH20] 13 cmH20  INTAKE / OUTPUT: I/O last 3 completed shifts: In: 7100 [I.V.:2350; Other:400; NG/GT:4200; IV Piggyback:150] Out: 5275 [Urine:5125; Stool:150]  PHYSICAL EXAMINATION: General: chronically ill appearing male, NAD on vent Neuro: Off sedation, tracked, stuck out tongue for RN this am, now not following commands but resists pupil check HEENT: [place]/AT, PERRL, ETT Cardiac: RRR, Nl S1/S2, -M/R/G. Chest: resps even non labored on PS 10/5, few scattered rhonchi  Abd: Soft, NT, ND and +BS. Ext:  Intact.   LABS:  PULMONARY  Recent Labs Lab 02/20/16 0405  02/21/16 0349  02/23/16 0300 02/24/16 0116 02/24/16 0411 02/25/16 0426 02/25/16 0430 02/26/16 0455  PHART 7.420  --  7.449  --  7.531* 7.527*  --  7.479*  --   --   PCO2ART 45.2*  --  46.7*  --  36.3 37.4  --  41.4  --   --   PO2ART 84.7  --  89.3  --  132* 131*  --  123*  --   --   HCO3 28.8*  --  31.9*  --  30.3* 30.9*  --  30.4*  --   --   TCO2 30.2  --  33.4  --  31.4 32.1  --  31.7  --   --   O2SAT 94.8  < > 96.7  < > 98.5 98.4 73.6 98.2 70.2 73.3  < > = values in this interval not displayed.  CBC  Recent Labs Lab 02/24/16 0350 02/25/16 0440 02/26/16 0400  HGB 10.5* 8.2* 9.3*  HCT 32.8* 25.6* 29.8*  WBC 13.7* 14.0* 12.2*  PLT 262 281 258   COAGULATION  Recent Labs Lab 02/21/16 0350 02/25/16 1403  INR 1.13 1.03   CARDIAC No results for input(s): TROPONINI in the last 168 hours. No results for input(s): PROBNP in the last 168 hours.  CHEMISTRY  Recent Labs Lab 02/21/16 0445 02/22/16 0300 02/23/16 0316 02/24/16 0350 02/25/16 0440 02/26/16 0400  NA 153* 155* 157*  150* 150* 148*  K 4.0 4.0 3.3* 3.4* 4.3 3.8  CL 114* 114* 112* 111 108 107  CO2 33* 30 31  GLUCOSE 140* 134* 109* 127* 96 62*  BUN 44* 46* 45* 39* 35* 30*  CREATININE 1.58* 1.56* 1.58* 1.61* 1.67* 1.73*  CALCIUM 7.4* 7.5* 7.8* 7.6* 7.9* 8.1*  MG 2.5* 2.6* 2.3 2.3 2.3  --   PHOS 2.8 2.5 3.0 5.4* 4.5  --    Estimated Creatinine Clearance: 47 mL/min (by C-G formula based on Cr of 1.73).  LIVER  Recent Labs Lab 02/20/16 0400 02/21/16 0350 02/25/16 1403  AST 86*  --   --   ALT 79*  --   --   ALKPHOS 45  --   --   BILITOT 0.4  --   --   PROT 5.1*  --   --   ALBUMIN 1.6*  --   --   INR  --  1.13 1.03   INFECTIOUS  Recent Labs Lab 02/20/16 1630  LATICACIDVEN 1.1   ENDOCRINE CBG (last 3)   Recent Labs  02/26/16 0428 02/26/16 0456 02/26/16 0737  GLUCAP 58* 110* 92   IMAGING x48h Dg Chest Portable  1 View  02/26/2016  CLINICAL DATA:  ARDS EXAM: PORTABLE CHEST 1 VIEW COMPARISON:  02/25/2016 FINDINGS: Cardiac shadow is stable. A nasogastric catheter and endotracheal tube are again seen in stable position. The lungs are well aerated bilaterally. Persistent left retrocardiac atelectasis and small effusion are noted. No new focal abnormality is seen. IMPRESSION: Stable changes in the left base. Electronically Signed   By: Alcide Clever M.D.   On: 02/26/2016 07:24   Dg Chest Port 1 View  02/25/2016  CLINICAL DATA:  Intubated patient, encephalopathy and meningitis, septic shock, CHF. EXAM: PORTABLE CHEST 1 VIEW COMPARISON:  Portable chest x-ray of February 24, 2016 FINDINGS: The lungs are well-expanded. There is persistent left lower lobe atelectasis or pneumonia and trace left pleural effusion. The right lung is clear. The heart and pulmonary vascularity are normal. The endotracheal tube tip lies approximately 2 cm above the carina. The esophagogastric tube tip projects below the inferior margin of the image. IMPRESSION: Somewhat low positioning of the endotracheal tube with. Withdrawal by 2 cm is recommended to avoid accidental mainstem bronchus intubation. Stable left lower lobe atelectasis and small left pleural effusion. Electronically Signed   By: David  [place] M.D.   On: 02/25/2016 07:24   50 yo homeless male smoker with hx of ETOH developed upper respiratory symptoms.  Developed progressive confusion, lactic acidosis, AKI. 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  Suffered cardiac arrest, cardiogenic shock with acute systolic CHF, ARDS 3/15.  ASSESSMENT / PLAN:  PULMONARY A:  Acute respiratory failure 2nd to ARDS, acute pulmonary edema. P:   F/u CXR Continue PS trials as able but mental status still needs sig improvement prior to extubation. [facility] 3/[facility].  CARDIOVASCULAR A:  Septic/cardiogenic shock >> mostly cardiogenic now. Acute systolic CHF. Cardiac arrest 3/15. NSTEMI. P:  Hold  diureses for now. Cards medications as needed. IABP out.  RENAL A:   AKI 2nd to ATN. Hypokalemia Hypernatremia P:   Follow BMP Replace electrolytes as indicated Free water 400 ml q6 hours. Decrease D5W to 75 ml/hr, was on 125 ml/hr. If Na normalizes by 3/25 will need diureses.  GASTROINTESTINAL A:   Nutrition. Shock liver. P:   Tube feeds post trach. Protonix. F/u LFTs.  HEMATOLOGIC A:   Leukocytosis. P:  F/u CBC  INFECTIOUS A:   Streptococcal bacteremia and meningitis. RPR positive. P:   Day 11 ceftriaxone, decadron per ID Pen G 2.4 million units IM weekly.  ENDOCRINE CBG (last 3)  A:   Hyperglycemia. Relative adrenal insufficiency >> cortisol 15.2 from 3/15. P:   SSI, lantus   NEUROLOGIC A:   Acute metabolic encephalopathy. Hx of ETOH, THC. Streptococcal meningitis  P:   Thiamine, folic acid Continue off sedation - follow MS.  AUTOIMMUNE A - no prior hx of autoimmune diz P - Anca neg, ana pos, ds-dna neg results, speckled pattern noticed.  Family updated at length bedside, will proceed with tracheostomy today.  The patient is critically ill with multiple organ systems failure and requires high complexity decision making for assessment and support, frequent evaluation and titration of therapies, application of advanced monitoring technologies and extensive interpretation of multiple databases.   Critical Care Time devoted to patient care services described in this note is  35  Minutes. This time reflects time of care of this signee Dr Koren BoundWesam Aldina Porta. This critical care time does not reflect procedure time, or teaching time or supervisory time of PA/NP/Med student/Med Resident etc but could involve care discussion time.  Alyson ReedyWesam G. Nellie Pester, M.D. [facility]/Critical Care Medicine. Pager: [phone]. After hours pager: [phone].

## 2016-02-26 NOTE — Progress Notes (Signed)
eLink Physician-Brief Progress Note Patient Name: Dale [name] DOB: 05/04/1966 MRN: [id number]   Date of Service  02/26/2016  HPI/Events of Note  Blood glucose = 96. Patient is on Lantus + Novolog SSI.  eICU Interventions  Will increase D5W IV infusion to 100 mL/hour.      Intervention Category Major Interventions: Other:;Hyperglycemia - active titration of insulin therapy  Lenell AntuSommer,Steven Eugene 02/26/2016, 11:42 PM

## 2016-02-26 NOTE — Progress Notes (Signed)
Patient ID: Dale [name], male   DOB: [birth year], 50 y.o.   MRN: [id number]    SUBJECTIVE:  Off dobutamine.  ECHO EF improved 50-55%. Yesterday bidil added.     Opens eyes.   Scheduled Meds: . amiodarone  200 mg Oral BID  . antiseptic oral rinse  7 mL Mouth Rinse 10 times per day  . aspirin  81 mg Oral Daily  . carvedilol  3.125 mg Oral BID WC  . cefTRIAXone (ROCEPHIN)  IV  2 g Intravenous Q12H  . chlorhexidine gluconate (SAGE KIT)  15 mL Mouth Rinse BID  . etomidate  40 mg Intravenous Once  . fentaNYL (SUBLIMAZE) injection  200 mcg Intravenous Once  . folic acid  1 mg Per Tube Daily  . free water  400 mL Per Tube 6 times per day  . heparin subcutaneous  5,000 Units Subcutaneous 3 times per day  . insulin aspart  2-6 Units Subcutaneous 6 times per day  . insulin aspart  3 Units Subcutaneous 6 times per day  . insulin glargine  20 Units Subcutaneous Q24H  . isosorbide-hydrALAZINE  2 tablet Oral TID  . midazolam  4 mg Intravenous Once  . pantoprazole sodium  40 mg Per Tube Q24H  . penicillin g benzathine (BICILLIN-LA) IM  2.4 Million Units Intramuscular Weekly  . propofol  5-80 mcg/kg/min Intravenous Once  . sodium chloride flush  10-40 mL Intracatheter Q12H  . spironolactone  25 mg Per Tube Daily  . thiamine  100 mg Per Tube Daily  . vecuronium  10 mg Intravenous Once   Continuous Infusions: . dextrose 125 mL/hr at 02/26/16 0456  . feeding supplement (VITAL AF 1.2 CAL) 1,000 mL (02/25/16 0802)   PRN Meds:.acetaminophen (TYLENOL) oral liquid 160 mg/5 mL, albuterol, sodium chloride flush    Filed Vitals:   02/26/16 0445 02/26/16 0500 02/26/16 0600 02/26/16 0700  BP:      Pulse:  80 78 84  Temp:  97.2 F (36.2 C) 97.3 F (36.3 C) 97.9 F (36.6 C)  TempSrc:      Resp:  '21 19 21  '$ Height:      Weight: 145 lb 1 oz (65.8 kg)     SpO2:  100% 100% 100%    Intake/Output Summary (Last 24 hours) at 02/26/16 0717 Last data filed at 02/26/16 0700  Gross per 24 hour  Intake    5115 ml  Output   3725 ml  Net   1390 ml    LABS: Basic Metabolic Panel:  Recent Labs  02/24/16 0350 02/25/16 0440 02/26/16 0400  NA 150* 150* 148*  K 3.4* 4.3 3.8  CL 111 108 107  CO2 33* 30 31  GLUCOSE 127* 96 62*  BUN 39* 35* 30*  CREATININE 1.61* 1.67* 1.73*  CALCIUM 7.6* 7.9* 8.1*  MG 2.3 2.3  --   PHOS 5.4* 4.5  --    Liver Function Tests: No results for input(s): AST, ALT, ALKPHOS, BILITOT, PROT, ALBUMIN in the last 72 hours. No results for input(s): LIPASE, AMYLASE in the last 72 hours. CBC:  Recent Labs  02/25/16 0440 02/26/16 0400  WBC 14.0* 12.2*  HGB 8.2* 9.3*  HCT 25.6* 29.8*  MCV 93.8 93.7  PLT 281 258   Cardiac Enzymes: No results for input(s): CKTOTAL, CKMB, CKMBINDEX, TROPONINI in the last 72 hours. BNP: Invalid input(s): POCBNP D-Dimer: No results for input(s): DDIMER in the last 72 hours. Hemoglobin A1C: No results for input(s): HGBA1C in the last 72  hours. Fasting Lipid Panel: No results for input(s): CHOL, HDL, LDLCALC, TRIG, CHOLHDL, LDLDIRECT in the last 72 hours. Thyroid Function Tests: No results for input(s): TSH, T4TOTAL, T3FREE, THYROIDAB in the last 72 hours.  Invalid input(s): FREET3 Anemia Panel: No results for input(s): VITAMINB12, FOLATE, FERRITIN, TIBC, IRON, RETICCTPCT in the last 72 hours.  RADIOLOGY: Dg Chest 2 View  02/13/2016  CLINICAL DATA:  50 year old male with history of anterior chest pain for the past 2 days. Low-grade fever. Dizziness. EXAM: CHEST  2 VIEW COMPARISON:  Chest x-ray 06/01/2009. FINDINGS: Lung volumes are normal. No consolidative airspace disease. No pleural effusions. No pneumothorax. No pulmonary nodule or mass noted. Pulmonary vasculature and the cardiomediastinal silhouette are within normal limits. IMPRESSION: No radiographic evidence of acute cardiopulmonary disease. Electronically Signed   By: Vinnie Langton M.D.   On: 02/13/2016 17:57   Dg Thoracic Spine 2 View  02/13/2016  CLINICAL DATA:   O thoracic and lower back pain for 1 week. Fall 2 days ago. Pain was before the fall. EXAM: THORACIC SPINE 2 VIEWS COMPARISON:  Chest x-ray today and 06/01/2009 FINDINGS: There is no evidence of thoracic spine fracture. Alignment is normal. No other significant bone abnormalities are identified. IMPRESSION: Negative. Electronically Signed   By: Rolm Baptise M.D.   On: 02/13/2016 20:45   Dg Lumbar Spine Complete  02/13/2016  CLINICAL DATA:  Low back pain and recent fall. EXAM: LUMBAR SPINE - COMPLETE 4+ VIEW COMPARISON:  08/25/2014 lumbar spine radiographs. FINDINGS: This report assumes 5 non rib-bearing lumbar vertebrae. Lumbar vertebral body heights are preserved, with no fracture. There is mild loss of disc height at L4-5 with associated minimal spondylosis, not appreciably changed. Otherwise preserved lumbar disc spaces. There is new 3 mm anterolisthesis at L4-5. No appreciable facet arthropathy. No appreciable foraminal stenosis. No aggressive appearing focal osseous lesions. IMPRESSION: 1. No lumbar spine fracture. 2. Stable mild degenerative disc disease at L4-5. 3. New mild 3 mm anterolisthesis at L4-5. Electronically Signed   By: Ilona Sorrel M.D.   On: 02/13/2016 20:47   Ct Head Wo Contrast  02/16/2016  CLINICAL DATA:  Fever and altered mental status; tremors EXAM: CT HEAD WITHOUT CONTRAST TECHNIQUE: Contiguous axial images were obtained from the base of the skull through the vertex without intravenous contrast. COMPARISON:  June 01, 2009 FINDINGS: The ventricles are mildly enlarged in a generalized manner and appear larger compared to the prior study. The sulci appear unremarkable. There is no intracranial mass, hemorrhage, extra-axial fluid collection, or midline shift. No focal gray-white compartment lesions are identified. No evidence of acute infarct. Bony calvarium appears intact. The mastoid air cells are clear. There is opacification in the left maxillary antrum as well as in multiple ethmoid  air cells bilaterally. There is mild mucosal thickening in the left sphenoid sinus region. No intraorbital lesions are apparent. IMPRESSION: Ventricles mildly enlarged with sulci appearing normal. This appearance potentially could represent a degree of normal pressure hydrocephalus. This finding also could be secondary to prior or recent meningitis meningitis. No intracranial mass, hemorrhage, or extra-axial fluid collection. No acute infarct evident. Multifocal sinusitis. Given these current findings, brain MRI pre and post-contrast may be helpful to further assess, in particular to further evaluate the meningeal regions. Electronically Signed   By: Lowella Grip III M.D.   On: 02/16/2016 14:04   Dg Chest Port 1 View  02/25/2016  CLINICAL DATA:  Intubated patient, encephalopathy and meningitis, septic shock, CHF. EXAM: PORTABLE CHEST 1 VIEW COMPARISON:  Portable chest x-ray of February 24, 2016 FINDINGS: The lungs are well-expanded. There is persistent left lower lobe atelectasis or pneumonia and trace left pleural effusion. The right lung is clear. The heart and pulmonary vascularity are normal. The endotracheal tube tip lies approximately 2 cm above the carina. The esophagogastric tube tip projects below the inferior margin of the image. IMPRESSION: Somewhat low positioning of the endotracheal tube with. Withdrawal by 2 cm is recommended to avoid accidental mainstem bronchus intubation. Stable left lower lobe atelectasis and small left pleural effusion. Electronically Signed   By: David  Martinique M.D.   On: 02/25/2016 07:24   Dg Chest Port 1 View  02/24/2016  CLINICAL DATA:  Endotracheal tube assessment. EXAM: PORTABLE CHEST 1 VIEW COMPARISON:  02/23/2016 FINDINGS: Endotracheal tube is present with tip measuring 2.5 cm above the carinal. Enteric tube tip is off the field of view but below the left hemidiaphragm. Shallow inspiration. Heart size and pulmonary vascularity are normal. Mild serration of left  hemidiaphragm suggesting infiltration and/ or effusion. No pneumothorax. IMPRESSION: Endotracheal tube tip measures 2.5 cm above the carina. Infiltration and/ or effusion in the left lung base. Electronically Signed   By: Lucienne Capers M.D.   On: 02/24/2016 01:34   Dg Chest Port 1 View  02/23/2016  CLINICAL DATA:  Shortness of breath. EXAM: PORTABLE CHEST 1 VIEW COMPARISON:  02/22/2016. FINDINGS: Endotracheal tube tip 1.8 cm above the carina, slight proximal repositioning of 1 a 2 cm should be considered. NG tube noted with tip below left hemidiaphragm. Heart size stable. Bilateral pulmonary infiltrates, left side greater right noted. Small left pleural effusion cannot be excluded. No pneumothorax . IMPRESSION: 1. Endotracheal tube tip 1.8 cm above the carina, slight proximal repositioning of 1 a 2 cm should be considered. NG tube in stable position. 2. Diffuse left lung and right lower lobe pulmonary infiltrates noted. These are new from prior exam. Small left pleural effusion cannot be excluded . Electronically Signed   By: Marcello Moores  Register   On: 02/23/2016 07:31   Dg Chest Port 1 View  02/22/2016  CLINICAL DATA:  Respiratory failure. EXAM: PORTABLE CHEST 1 VIEW COMPARISON:  02/21/2016. FINDINGS: Endotracheal tube, NG tube, right IJ sheath in stable position. Interim removal Swan-Ganz catheter. Heart size stable. Low lung volumes with mild bibasilar atelectasis and or infiltrates. Interim improvement from prior exam. Small left pleural effusion cannot be excluded. No pneumothorax. IMPRESSION: 1. Level of Swan-Ganz catheter. Endotracheal tube, NG tube, right IJ sheath in stable position. 2. Low lung volumes with mild bibasilar atelectasis and/or infiltrates. Interim improvement from prior exam. Small left pleural effusion cannot be excluded. Electronically Signed   By: Marcello Moores  Register   On: 02/22/2016 07:13   Dg Chest Port 1 View  02/21/2016  CLINICAL DATA:  Respiratory failure EXAM: PORTABLE CHEST 1  VIEW COMPARISON:  Chest radiograph from one day prior. FINDINGS: Right internal jugular Swan-Ganz catheter terminates in the right infrahilar region. Enteric tube enters the stomach with the tip not seen on this image. Endotracheal tube tip is 2.5 cm above the carina. Stable cardiomediastinal silhouette with normal heart size. No pneumothorax. Stable small left pleural effusion. No pulmonary edema. Stable left lower lobe consolidation. IMPRESSION: 1. Well-positioned endotracheal tube. Right internal jugular Swan-Ganz catheter terminates over the right infrahilar region, consider retracting 2-3 cm. 2. Stable small left pleural effusion. 3. Stable left lower lobe consolidation, favor atelectasis. Electronically Signed   By: Ilona Sorrel M.D.   On: 02/21/2016 08:00  Dg Chest Port 1 View  02/20/2016  CLINICAL DATA:  CHF EXAM: PORTABLE CHEST 1 VIEW COMPARISON:  Chest radiograph from one day prior. FINDINGS: Right internal jugular Swan-Ganz catheter terminates over the central right lower lung, unchanged. Endotracheal tube tip is 2.4 cm above the carina. Enteric tube enters the stomach with the tip not seen on this image. Intra-aortic balloon pump marker terminates over the proximal descending thoracic aorta at the T7-8 level. Stable cardiomediastinal silhouette with normal heart size. No pneumothorax. No pleural effusion. Hazy opacities throughout the bilateral parahilar lungs are not appreciably changed. Probable complete left lower lobe atelectasis, unchanged. IMPRESSION: 1. Right internal jugular Swan-Ganz catheter terminates over the central lower right lung, consider retracting 3-4 cm . 2. Otherwise well-positioned support structures as described. 3. Stable hazy opacities throughout the parahilar lungs, favor pulmonary edema. Electronically Signed   By: Ilona Sorrel M.D.   On: 02/20/2016 09:34   Dg Chest Port 1 View  02/19/2016  CLINICAL DATA:  CHF EXAM: PORTABLE CHEST 1 VIEW COMPARISON:  02/18/2016 FINDINGS:  Support devices remain in place, unchanged. Heart is normal size. Diffuse interstitial and alveolar opacities are again noted, likely edema. This is somewhat more confluent in both lung bases, left greater than right. Cannot completely exclude infiltrate at the left base. No visible effusions. No acute bony abnormality. IMPRESSION: Continued diffuse interstitial and alveolar opacities, likely edema. Somewhat more focal opacities in the lung bases, left greater than right. Cannot exclude pneumonia. Electronically Signed   By: Rolm Baptise M.D.   On: 02/19/2016 08:29   Dg Chest Port 1 View  02/18/2016  CLINICAL DATA:  Entry aortic balloon pump asist. EXAM: PORTABLE CHEST 1 VIEW COMPARISON:  February 17, 2016. FINDINGS: The heart size and mediastinal contours are within normal limits. No pneumothorax or pleural effusion is noted. Endotracheal nasogastric tubes are unchanged in position. Right internal jugular Swan-Ganz catheter is unchanged with distal tip in expected position of right pulmonary artery. Distal tip of aortic balloon catheter is seen over expected position of superior portion of descending thoracic aorta just below aortic knob. Stable bilateral perihilar opacities are noted concerning for edema or inflammation. The visualized skeletal structures are unremarkable. IMPRESSION: Distal tip of aortic balloon catheter appears to be slightly more inferior in position compared to prior exam, just below aortic knob currently. This projects over the expected position of the superior aspect of descending thoracic aorta. Stable bilateral perihilar opacities are noted concerning for edema or inflammation. Otherwise stable support apparatus. Electronically Signed   By: Marijo Conception, M.D.   On: 02/18/2016 07:24   Dg Chest Port 1 View  02/17/2016  CLINICAL DATA:  Ventilator dependence. EXAM: PORTABLE CHEST 1 VIEW COMPARISON:  Earlier the same day. FINDINGS: 1822 hours. Endotracheal tube tip is 7.4 cm above the  base of the carina. Right IJ pulmonary artery catheter tip is in the interlobar pulmonary artery. The tip of the intra-aortic balloon pump projects over the inferior aspect of the transverse aorta. The NG tube passes into the stomach although the distal tip position is not included on the film. Proximal port of the NG tube is at or just above the EG junction. Diffuse interstitial and central alveolar opacities suggest edema. Telemetry leads overlie the chest. IMPRESSION: The tip of the intra-aortic balloon pump projects over the inferior aspect of the transverse aorta. Electronically Signed   By: Misty Stanley M.D.   On: 02/17/2016 18:34   Dg Chest Port 1 View  02/17/2016  CLINICAL DATA:  50 year old male with a history of Swan-Ganz catheter placement. EXAM: PORTABLE CHEST 1 VIEW COMPARISON:  02/17/2016 FINDINGS: Cardiomediastinal silhouette unchanged in size and contour. Skull pads again project over the thorax. Unchanged endotracheal tube, gastric tube. Interval placement of right IJ sheath, through which a Swan-Ganz catheter has been placed terminating in the right pulmonary arteries. Persisting bilateral airspace opacity IMPRESSION: Interval placement of right IJ sheath, through which a Swan-Ganz catheter has been placed and terminates in the right pulmonary arteries at the hilum. Similar appearance of bilateral airspace opacities, potentially infection and/or edema. Unchanged support apparatus as above. Signed, Dulcy Fanny. Earleen Newport, DO [facility] Electronically Signed   By: Corrie Mckusick D.O.   On: 02/17/2016 13:53   Dg Chest Port 1 View  02/17/2016  CLINICAL DATA:  Hypoxia EXAM: PORTABLE CHEST 1 VIEW COMPARISON:  February 16, 2016 FINDINGS: Endotracheal tube tip is 3.5 cm above the carina. Nasogastric tube tip and side port are in the stomach. No pneumothorax evident. There is interstitial edema throughout the lungs bilaterally, perhaps slightly  increased from 1 day prior. No airspace consolidation. Heart is upper normal in size with pulmonary vascularity within normal limits. No adenopathy. IMPRESSION: Tube positions as described without pneumothorax. Slight increase in interstitial edema. No airspace consolidation. No change in cardiac silhouette. Electronically Signed   By: Lowella Grip III M.D.   On: 02/17/2016 07:31   Dg Chest Portable 1 View  02/16/2016  CLINICAL DATA:  Endotracheal tube placement EXAM: PORTABLE CHEST 1 VIEW COMPARISON:  Chest x-ray from earlier same day. FINDINGS: Endotracheal tube well positioned with tip approximately 2 cm above the carina. Enteric tube passes below the diaphragm. Heart size is normal. There is central pulmonary vascular congestion and mild interstitial edema. Suspect small left pleural effusion. No pneumothorax. Osseous structures about the chest are unremarkable. IMPRESSION: 1. Endotracheal tube well positioned with tip approximately 2 cm above the carina. 2. Central pulmonary vascular congestion and mild bilateral interstitial edema suggesting mild volume overload/resuscitation efforts. No frank alveolar pulmonary edema. Electronically Signed   By: Franki Cabot M.D.   On: 02/16/2016 19:38   Dg Chest Port 1 View  02/16/2016  CLINICAL DATA:  Altered mental status.  Depression. EXAM: PORTABLE CHEST 1 VIEW COMPARISON:  02/13/2016. FINDINGS: Midline trachea. Normal heart size and mediastinal contours. Left costophrenic angle exclusion. No pleural effusion or pneumothorax. Clear lungs. IMPRESSION: No active disease. Electronically Signed   By: Abigail Miyamoto M.D.   On: 02/16/2016 13:48   Ct Portable Head W/o Cm  02/17/2016  CLINICAL DATA:  50 year old male with a history of hydrocephalus. Acute meningitis. EXAM: CT HEAD WITHOUT CONTRAST TECHNIQUE: Contiguous axial images were obtained from the base of the skull through the vertex without intravenous contrast. COMPARISON:  02/16/2016 FINDINGS: Unremarkable  appearance of the calvarium without acute fracture or aggressive lesion. Unremarkable appearance of the scalp soft tissues. Unremarkable appearance of the bilateral orbits. Partially visualized endotracheal tube.  Oral enteric tube. Near complete opacification of the left maxillary sinus with frothy secretions. Trace secretions of the right maxillary sinus. Mucoperiosteal thickening of the ethmoid air cells and the sphenoid sinuses. Trace disease of the frontal sinuses. Mastoid air cells are clear. Layered fluid within the nasopharynx. Configuration the ventricles is similar to comparison with bifrontal diameter measuring 40 mm, unchanged from prior. Unchanged appearance of the hemispheric sulci. No acute intracranial hemorrhage.  No midline shift. Gray-white differentiation relatively maintained. IMPRESSION: Unchanged ventricular dilation and appearance  of the cerebral sulci, with no evidence of acute hemorrhage or infarction. Signed, Dulcy Fanny. Earleen Newport, DO [facility] Electronically Signed   By: Corrie Mckusick D.O.   On: 02/17/2016 07:23    PHYSICAL EXAM General: Intubated Neck: JVP not elevated, no thyromegaly or thyroid nodule. RIJ  Lungs: Course   CV: Nondisplaced PMI.  Heart regular S1/S2, no S3, no murmur.  No peripheral edema.  Abdomen: Soft, no hepatosplenomegaly, no distention.  Neurologic: Intubated. Eyes open Tacking with eyes.  Extremities: No clubbing or cyanosis.  GU: Foley.   TELEMETRY: Reviewed telemetry pt in NSR in 80-90s  ASSESSMENT AND PLAN: 50 yo with minimal past medical history was admitted with confusion, prior URI symptoms.  Had LP concerning for meningitis and found to have strep bacteremia/meningitis.  Later developed shock requiring pressors and was intubated for airway protection.  Bedside echo showed EF about 10%.  Swan placed, noted cardiogenic shock and had IABP.   1. Shock: Primarily appears to have cardiogenic  shock.  Cardiac output much improved, IABP out 3/18.   CO-OX 73%. Off dobutamine.  2. Acute hypoxemic respiratory failure: CXR looked like pulmonary edema.   3. ID: Strep bacteremia with meningitis.  On ceftriaxone. WBC 12.2  Completed  with 4 days of dexamethasone.  Also treating for syphilis.  4. AKI: Creatinine stable.  5. Acute systolic CHF: EF 76-19% on 3/16 echo.  Repeat ECHO with EF 50-55%.  - Continue spironolactone - Continue bidil 2 tab tid. - Increase  Coreg 6.25  mg bid.  6. Elevated LFTs: Suspect shock liver. LFTs improved.  7. Cardiac arrest: PEA arrest then ventricular fibrillation in ER.  Now on po amiodarone in NSR.  8. Neuro: Eyes open. Tracking with eyes.   9. Hypernatremia:  Sodium 148 . Continue  free water boluses.  If unable to extutabate today--trach   Amy Clegg NP-C  02/26/2016 7:17 AM   Patient seen with NP, agree with the above note.  He is hemodynamically stable.  Awake, follows some commands.  SBT ongoing, possible extubation if does well.   Loralie Champagne 02/26/2016 9:24 AM

## 2016-02-26 NOTE — Progress Notes (Signed)
Patient scheduled for tracheostomy on today. CSW will continue to follow for disposition.   Noe GensAshley Gardner, LCSW [facility] Clinical Social Worker [phone]

## 2016-02-26 NOTE — Procedures (Signed)
Bronchoscopy Procedure Note Dale [name] [id number] 03/26/1966  Procedure: Bronchoscopy Indications: Tracheostomy placement   Procedure Details Consent: Risks of procedure as well as the alternatives and risks of each were explained to the (patient/caregiver).  Consent for procedure obtained.   Time Out: Verified patient identification, verified procedure, site/side was marked, verified correct patient position, special equipment/implants available, medications/allergies/relevent history reviewed, required imaging and test results available.  Performed  In preparation for procedure, patient was given 100% FiO2 and bronchoscope lubricated. Sedation: Benzodiazepines, Etomidate and Fentanyl & Rocuroninum  Airway entered and the following bronchi were examined: upper airway examined for trach placement / direct visualization for placement. Procedures performed: tracheostomy placement.  Bronchoscope removed.    Evaluation Hemodynamic Status: BP stable throughout; O2 sats: stable throughout Patient's Current Condition: stable Specimens:  None Complications: No apparent complications Patient did tolerate procedure well.   Procedure performed under direct supervision of Dr. Molli KnockYacoub.  Dale BrimBrandi Ollis, NP-C [facility] Pgr: [phone] or if no answer [phone] 02/26/2016, 2:55 PM  Alyson ReedyWesam G. Rudi Knippenberg, M.D. [facility]. Pager: [phone]. After hours pager: [phone].

## 2016-02-26 NOTE — Progress Notes (Signed)
Peripherally Inserted Central Catheter/Midline Placement  The IV Nurse has discussed with the patient and/or persons authorized to consent for the patient, the purpose of this procedure and the potential benefits and risks involved with this procedure.  The benefits include less needle sticks, lab draws from the catheter and patient may be discharged home with the catheter.  Risks include, but not limited to, infection, bleeding, blood clot (thrombus formation), and puncture of an artery; nerve damage and irregular heat beat.  Alternatives to this procedure were also discussed.  PICC/Midline Placement Documentation        Lisabeth DevoidGibbs, Oswell Say Jeanette 02/26/2016, 11:21 AM Phone consent  Obtained from wife

## 2016-02-26 NOTE — Progress Notes (Signed)
Inpatient Diabetes Program Recommendations  AACE/ADA: New Consensus Statement on Inpatient Glycemic Control (2015)  Target Ranges:  Prepandial:   less than 140 mg/dL      Peak postprandial:   less than 180 mg/dL (1-2 hours)      Critically ill patients:  140 - 180 mg/dL   Review of Glycemic Control  Results for Bonnita HollowJONES, Taytum D (MRN [id number]) as of 02/26/2016 16:37  Ref. Range 02/25/2016 19:43 02/26/2016 00:01 02/26/2016 04:28 02/26/2016 04:56 02/26/2016 07:37  Glucose-Capillary Latest Ref Range: 65-99 mg/dL 96 99 58 (L) 409110 (H) 92  Results for Bonnita HollowJONES, Abdiaziz D (MRN [id number]) as of 02/26/2016 16:37  Ref. Range 02/26/2016 04:00  Glucose Latest Ref Range: 65-99 mg/dL 62 (L)    Inpatient Diabetes Program Recommendations:    Decrease Lantus to 18 units QHS.  Will continue to follow. Thank you. Ailene Ardshonda Patrick Sohm, RD, LDN, CDE Inpatient Diabetes Coordinator [phone]

## 2016-02-26 NOTE — Progress Notes (Signed)
Hypoglycemic Event  CBG: 58  Treatment: d50 25cc,increase maintenance d5w to 125  Symptoms: drowsy  Follow-up CBG: Time:0450 CBG Result:110  Possible Reasons for Event: npo and got lantus yesterday  Comments/MD notified Dr Towana BadgerSommer    Taiwo Fish, [place]opher Castro

## 2016-02-26 NOTE — Progress Notes (Signed)
Pt was able to follow commands able to squeeze your hands on the right-unable to do it on the left.Pt mostly respond to voice-Dr Arsenio LoaderSommer made aware of the findings.Pt extremities appear weaker on the left.

## 2016-02-26 NOTE — Progress Notes (Signed)
Pt back to full support for PICC line per RN. RT will continue to monitor.

## 2016-02-27 LAB — CBC
HCT: 27.9 % — ABNORMAL LOW (ref 39.0–52.0)
Hemoglobin: 8.9 g/dL — ABNORMAL LOW (ref 13.0–17.0)
MCH: 29.6 pg (ref 26.0–34.0)
MCHC: 31.9 g/dL (ref 30.0–36.0)
MCV: 92.7 fL (ref 78.0–100.0)
PLATELETS: 251 10*3/uL (ref 150–400)
RBC: 3.01 MIL/uL — AB (ref 4.22–5.81)
RDW: 14.5 % (ref 11.5–15.5)
WBC: 8.8 10*3/uL (ref 4.0–10.5)

## 2016-02-27 LAB — GLUCOSE, CAPILLARY
GLUCOSE-CAPILLARY: 103 mg/dL — AB (ref 65–99)
GLUCOSE-CAPILLARY: 92 mg/dL (ref 65–99)
GLUCOSE-CAPILLARY: 75 mg/dL (ref 65–99)
Glucose-Capillary: 103 mg/dL — ABNORMAL HIGH (ref 65–99)
Glucose-Capillary: 139 mg/dL — ABNORMAL HIGH (ref 65–99)

## 2016-02-27 LAB — BASIC METABOLIC PANEL
Anion gap: 8 (ref 5–15)
BUN: 34 mg/dL — AB (ref 6–20)
CALCIUM: 7.9 mg/dL — AB (ref 8.9–10.3)
CO2: 30 mmol/L (ref 22–32)
Chloride: 104 mmol/L (ref 101–111)
Creatinine, Ser: 1.82 mg/dL — ABNORMAL HIGH (ref 0.61–1.24)
GFR calc Af Amer: 48 mL/min — ABNORMAL LOW (ref >60)
GFR, EST NON AFRICAN AMERICAN: 41 mL/min — AB (ref >60)
GLUCOSE: 113 mg/dL — AB (ref 65–99)
POTASSIUM: 3.8 mmol/L (ref 3.5–5.1)
Sodium: 142 mmol/L (ref 135–145)

## 2016-02-27 LAB — CARBOXYHEMOGLOBIN
CARBOXYHEMOGLOBIN: 1 % (ref 0.5–1.5)
METHEMOGLOBIN: 1 % (ref 0.0–1.5)
O2 Saturation: 75.9 %
TOTAL HEMOGLOBIN: 8 g/dL — AB (ref 13.5–18.0)

## 2016-02-27 LAB — PHOSPHORUS: Phosphorus: 4.8 mg/dL — ABNORMAL HIGH (ref 2.5–4.6)

## 2016-02-27 LAB — MAGNESIUM: Magnesium: 2.2 mg/dL (ref 1.7–2.4)

## 2016-02-27 MED ORDER — INSULIN GLARGINE 100 UNIT/ML ~~LOC~~ SOLN
20.0000 [IU] | SUBCUTANEOUS | Status: DC
Start: 2016-02-27 — End: 2016-02-28
  Administered 2016-02-27: 20 [IU] via SUBCUTANEOUS
  Filled 2016-02-27 (×2): qty 0.2

## 2016-02-27 MED ORDER — INSULIN GLARGINE 100 UNIT/ML ~~LOC~~ SOLN
20.0000 [IU] | SUBCUTANEOUS | Status: DC
  Filled 2016-02-27: qty 0.2

## 2016-02-27 MED ORDER — INSULIN ASPART 100 UNIT/ML ~~LOC~~ SOLN
2.0000 [IU] | SUBCUTANEOUS | Status: DC
  Administered 2016-02-27 – 2016-02-28 (×2): 2 [IU] via SUBCUTANEOUS

## 2016-02-27 MED ORDER — FREE WATER
400.0000 mL | Freq: Four times a day (QID) | Status: DC
  Administered 2016-02-28 (×2): 400 mL

## 2016-02-27 MED ORDER — FENTANYL CITRATE (PF) 100 MCG/2ML IJ SOLN
25.0000 ug | INTRAMUSCULAR | Status: DC | PRN
  Administered 2016-02-27 – 2016-02-28 (×2): 50 ug via INTRAVENOUS
  Administered 2016-02-29 – 2016-03-01 (×6): 100 ug via INTRAVENOUS
  Administered 2016-03-01: 75 ug via INTRAVENOUS
  Administered 2016-03-01: 100 ug via INTRAVENOUS
  Administered 2016-03-01: 50 ug via INTRAVENOUS
  Administered 2016-03-02 – 2016-03-04 (×3): 100 ug via INTRAVENOUS
  Administered 2016-03-05 (×3): 50 ug via INTRAVENOUS
  Administered 2016-03-06 – 2016-03-07 (×5): 100 ug via INTRAVENOUS
  Administered 2016-03-07: 50 ug via INTRAVENOUS
  Administered 2016-03-07 – 2016-03-09 (×4): 100 ug via INTRAVENOUS
  Filled 2016-02-27 (×27): qty 2

## 2016-02-27 NOTE — Procedures (Signed)
Pt removed from cpap and placed on full support for the night.

## 2016-02-27 NOTE — Progress Notes (Signed)
PULMONARY / CRITICAL CARE MEDICINE   Name: Dale [name] MRN: [id number] DOB: 07/15/1966    ADMISSION DATE:  02/16/2016   CONSULTATION DATE:  02/16/16  REFERRING MD:  Rito EhrlichKrishnan  CHIEF COMPLAINT:  AMS  SUBJECTIVE:  Post trach 3/24, weaning on PSV 10/5.  Pt reports generalized pain - unable to specify location.  Afebrile, VSS.    VITAL SIGNS: BP 102/70 mmHg  Pulse 79  Temp(Src) 98.2 F (36.8 C) (Oral)  Resp 18  Ht 5' 8.9" (1.75 m)  Wt 143 lb 11.8 oz (65.2 kg)  BMI 21.29 kg/m2  SpO2 99%  HEMODYNAMICS: CVP:  [8 mmHg-14 mmHg] 8 mmHg  VENTILATOR SETTINGS: Vent Mode:  [-] CPAP;PSV FiO2 (%):  [30 %-50 %] 30 % Set Rate:  [16 bmp-20 bmp] 20 bmp Vt Set:  [410 mL] 410 mL PEEP:  [5 cmH20] 5 cmH20 Pressure Support:  [5 cmH20-12 cmH20] 12 cmH20 Plateau Pressure:  [12 cmH20-15 cmH20] 13 cmH20  INTAKE / OUTPUT: I/O last 3 completed shifts: In: 6582.5 [I.V.:3407.5; Other:400; KV/QQ:5956G/GT:2675; IV Piggyback:100] Out: 4550 [Urine:4550]  PHYSICAL EXAMINATION: General: chronically ill appearing male, NAD on vent Neuro:  Awake, alert, follows commands HEENT: [place]/AT, PERRL, #6 trach midline, scant bloody secretions at site Cardiac: RRR, Nl S1/S2, -M/R/G. Chest: resps even non-labored on PS 10/5, few scattered rhonchi  Abd: Soft, NT, ND and +BS. Ext: Intact, warm/dry   LABS:  PULMONARY  Recent Labs Lab 02/21/16 0349  02/23/16 0300 02/24/16 0116 02/24/16 0411 02/25/16 0426 02/25/16 0430 02/26/16 0455 02/27/16 0434  PHART 7.449  --  7.531* 7.527*  --  7.479*  --   --   --   PCO2ART 46.7*  --  36.3 37.4  --  41.4  --   --   --   PO2ART 89.3  --  132* 131*  --  123*  --   --   --   HCO3 31.9*  --  30.3* 30.9*  --  30.4*  --   --   --   TCO2 33.4  --  31.4 32.1  --  31.7  --   --   --   O2SAT 96.7  < > 98.5 98.4 73.6 98.2 70.2 73.3 75.9  < > = values in this interval not displayed.  CBC  Recent Labs Lab 02/25/16 0440 02/26/16 0400 02/27/16 0435  HGB 8.2* 9.3* 8.9*  HCT  25.6* 29.8* 27.9*  WBC 14.0* 12.2* 8.8  PLT 281 258 251   COAGULATION  Recent Labs Lab 02/21/16 0350 02/25/16 1403  INR 1.13 1.03   CARDIAC No results for input(s): TROPONINI in the last 168 hours. No results for input(s): PROBNP in the last 168 hours.  CHEMISTRY  Recent Labs Lab 02/22/16 0300 02/23/16 0316 02/24/16 0350 02/25/16 0440 02/26/16 0400 02/27/16 0435  NA 155* 157* 150* 150* 148* 142  K 4.0 3.3* 3.4* 4.3 3.8 3.8  CL 114* 112* 111 108 107 104  CO2 31 30 33* 30 31 30   GLUCOSE 134* 109* 127* 96 62* 113*  BUN 46* 45* 39* 35* 30* 34*  CREATININE 1.56* 1.58* 1.61* 1.67* 1.73* 1.82*  CALCIUM 7.5* 7.8* 7.6* 7.9* 8.1* 7.9*  MG 2.6* 2.3 2.3 2.3  --  2.2  PHOS 2.5 3.0 5.4* 4.5  --  4.8*   Estimated Creatinine Clearance: 44.3 mL/min (by C-G formula based on Cr of 1.82).  LIVER  Recent Labs Lab 02/21/16 0350 02/25/16 1403  INR 1.13 1.03   INFECTIOUS  Recent Labs Lab  02/20/16 1630  LATICACIDVEN 1.1   ENDOCRINE CBG (last 3)   Recent Labs  02/26/16 2326 02/27/16 0436 02/27/16 0813  GLUCAP 96 103* 103*   IMAGING x48h Dg Chest Portable 1 View  02/26/2016  CLINICAL DATA:  ARDS EXAM: PORTABLE CHEST 1 VIEW COMPARISON:  02/25/2016 FINDINGS: Cardiac shadow is stable. A nasogastric catheter and endotracheal tube are again seen in stable position. The lungs are well aerated bilaterally. Persistent left retrocardiac atelectasis and small effusion are noted. No new focal abnormality is seen. IMPRESSION: Stable changes in the left base. Electronically Signed   By: Alcide Clever M.D.   On: 02/26/2016 07:24   Dg Abd Portable 1v  02/26/2016  CLINICAL DATA:  Nasogastric tube placement EXAM: PORTABLE ABDOMEN - 1 VIEW COMPARISON:  None. FINDINGS: Nasogastric tube tip is near the pylorus with the side port in the gastric antrum region. Bowel gas pattern is unremarkable. No free air evident. There is consolidation in the medial left lung base. IMPRESSION: Nasogastric tube  tip and side port in distal stomach. Bowel gas pattern unremarkable. Medial left lung base consolidation. Electronically Signed   By: Bretta Bang III M.D.   On: 02/26/2016 16:40   STUDIES:  3/14 CT head > multifocal sinusitis, enlarged ventricles 3/14 LP >> glucose < 20, protein > 600, WBC 340 (88%N), 89 RBC  CULTURES: 3/14 Blood >> strep pneumo 3/14 Urine >> neg 3/14 CSF >> strep pneumo  ANTIBIOTICS: 3/14 Ampicillin >> 3/14 3/14 Vancomycin >> off 3/14 Rocephin >> Pen G weekly >>  LINES/TUBES: 3/14 ETT >> 3/15 Rt femoral CVL >> 3/15 Rt femoral aline >> 3/15 Rt IJ introducer >> out   SIGNIFICANT EVENTS: 3/14  Admit, VDRF, ID/cardiology/neurology consulted 3/15  Cardiac arrest, 5 mins, Asystole > VF > ST; ARDS, septic/cardiogenic shock >> IABP; start paralytic 3/16  Remains on paralytic, pressors, inotropes. 3/18  IABP to be removed 3/18  Nimbex discontinued  3/24  Bedside trach   DISCUSSION: 50 y/o reportedly homeless male, smoker, with PMH of ETOH abuse admitted 3/14 for worsening upper respiratory symptoms.  Developed progressive confusion, lactic acidosis, AKI. 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 Suffered cardiac arrest, cardiogenic shock with acute systolic CHF, ARDS 3/15.  Trach 3/24.    ASSESSMENT / PLAN:  PULMONARY A:  Acute respiratory failure 2nd to ARDS, acute pulmonary edema Tracheostomy Status Former Tobacco Abuse P:   Intermittent CXR Daily SBT  Mobilize as able PRN albuterol   CARDIOVASCULAR A:  Septic/cardiogenic shock - resolved, in setting of streptococcal bacteremia and meningitis Acute systolic CHF Cardiac arrest 3/15 NSTEMI P:  ICU monitoring  Cardiology following Continue amiodarone, coreg, bidil & spironolactone IABP out.  RENAL A:   AKI 2nd to ATN. Hypokalemia Hypernatremia P:   Follow BMP Replace electrolytes as indicated Free water 400 ml q6 hours. Decrease D5W to 75 ml/hr, was on 125 ml/hr. If Na normalizes by 3/25  will need diureses.  GASTROINTESTINAL A:   Nutrition Shock liver P:   Tube feeds post trach Protonix F/u LFTs  HEMATOLOGIC A:   Anemia  Leukocytosis - resolved  P:  F/u CBC Heparin for DVT prophylaxis   INFECTIOUS A:   Streptococcal bacteremia and meningitis RPR positive P:  Day 11 ceftriaxone, decadron per ID Pen G 2.4 million units IM weekly  ENDOCRINE A:   Hyperglycemia Relative adrenal insufficiency >> cortisol 15.2 from 3/15 P:   SSI with CBG Q4 Lantus 20 units Q24  NEUROLOGIC A:   Acute metabolic encephalopathy Hx of ETOH,  THC Streptococcal meningitis  P:   Thiamine, folic acid Continue off sedation - follow MS  AUTOIMMUNE A: No prior hx of autoimmune diz P: Anca neg, ana pos, ds-dna neg results, speckled pattern noticed.   Family - no family at bedside am 3/25  Canary Brim, NP-C [place] [facility] Pgr: [phone] or if no answer [phone] 02/27/2016, 9:46 AM

## 2016-02-28 ENCOUNTER — Inpatient Hospital Stay (HOSPITAL_COMMUNITY): Payer: Medicaid Other

## 2016-02-28 LAB — GLUCOSE, CAPILLARY
GLUCOSE-CAPILLARY: 112 mg/dL — AB (ref 65–99)
GLUCOSE-CAPILLARY: 110 mg/dL — AB (ref 65–99)
GLUCOSE-CAPILLARY: 104 mg/dL — AB (ref 65–99)
Glucose-Capillary: 105 mg/dL — ABNORMAL HIGH (ref 65–99)
Glucose-Capillary: 107 mg/dL — ABNORMAL HIGH (ref 65–99)
Glucose-Capillary: 128 mg/dL — ABNORMAL HIGH (ref 65–99)

## 2016-02-28 LAB — CBC
HEMATOCRIT: 26.2 % — AB (ref 39.0–52.0)
HEMOGLOBIN: 8.3 g/dL — AB (ref 13.0–17.0)
MCH: 29.5 pg (ref 26.0–34.0)
MCHC: 31.7 g/dL (ref 30.0–36.0)
MCV: 93.2 fL (ref 78.0–100.0)
Platelets: 262 10*3/uL (ref 150–400)
RBC: 2.81 MIL/uL — ABNORMAL LOW (ref 4.22–5.81)
RDW: 14.5 % (ref 11.5–15.5)
WBC: 9.3 10*3/uL (ref 4.0–10.5)

## 2016-02-28 LAB — BASIC METABOLIC PANEL
Anion gap: 10 (ref 5–15)
BUN: 27 mg/dL — AB (ref 6–20)
CO2: 28 mmol/L (ref 22–32)
Calcium: 8.1 mg/dL — ABNORMAL LOW (ref 8.9–10.3)
Chloride: 102 mmol/L (ref 101–111)
Creatinine, Ser: 1.59 mg/dL — ABNORMAL HIGH (ref 0.61–1.24)
GFR calc Af Amer: 56 mL/min — ABNORMAL LOW (ref >60)
GFR calc non Af Amer: 49 mL/min — ABNORMAL LOW (ref >60)
GLUCOSE: 119 mg/dL — AB (ref 65–99)
Potassium: 3.8 mmol/L (ref 3.5–5.1)
SODIUM: 140 mmol/L (ref 135–145)

## 2016-02-28 LAB — CARBOXYHEMOGLOBIN
CARBOXYHEMOGLOBIN: 0.7 % (ref 0.5–1.5)
METHEMOGLOBIN: 0.8 % (ref 0.0–1.5)
O2 SAT: 78.6 %
TOTAL HEMOGLOBIN: 12.3 g/dL — AB (ref 13.5–18.0)

## 2016-02-28 MED ORDER — INSULIN GLARGINE 100 UNIT/ML ~~LOC~~ SOLN
15.0000 [IU] | SUBCUTANEOUS | Status: DC
Start: 2016-02-28 — End: 2016-02-28
  Filled 2016-02-28: qty 0.15

## 2016-02-28 MED ORDER — FREE WATER
300.0000 mL | Freq: Four times a day (QID) | Status: DC
  Administered 2016-02-28 – 2016-02-29 (×4): 300 mL

## 2016-02-28 MED ORDER — POTASSIUM CHLORIDE 20 MEQ/15ML (10%) PO SOLN: 40.0000 meq | Freq: Once | ORAL | Status: DC

## 2016-02-28 MED ORDER — ANTISEPTIC ORAL RINSE SOLUTION (CORINZ)
7.0000 mL | OROMUCOSAL | Status: DC
  Administered 2016-02-28 – 2016-03-01 (×12): 7 mL via OROMUCOSAL

## 2016-02-28 NOTE — Progress Notes (Signed)
RT returned patient back to full support ventilator settings due to desaturation on ATC. Patient tolerating well at this time. RN aware. RT will continue to monitor.

## 2016-02-28 NOTE — Progress Notes (Signed)
eLink Physician-Brief Progress Note Patient Name: Dale [name] DOB: 08/22/1966 MRN: [id number]   Date of Service  02/28/2016  HPI/Events of Note  Sugars staying low side all day s SSI  eICU Interventions  Hold off starting lantus, may not be needed at all      Intervention Category Major Interventions: Hyperglycemia - active titration of insulin therapy  Sandrea HughsMichael Aldena Worm 02/28/2016, 10:01 PM

## 2016-02-28 NOTE — Progress Notes (Signed)
PULMONARY / CRITICAL CARE MEDICINE   Name: Dale [name] MRN: [id number] DOB: [birth year]    ADMISSION DATE:  02/16/2016   CONSULTATION DATE:  02/16/16  REFERRING MD:  Rito Ehrlich  CHIEF COMPLAINT:  AMS  SUBJECTIVE:  No acute events.  Weaned on PSV until 8PM yesterday.     VITAL SIGNS: BP 119/80 mmHg  Pulse 85  Temp(Src) 97.9 F (36.6 C) (Oral)  Resp 21  Ht 5' 8.9" (1.75 m)  Wt 148 lb 13 oz (67.5 kg)  BMI 22.04 kg/m2  SpO2 100%  HEMODYNAMICS: CVP:  [8 mmHg-14 mmHg] 14 mmHg  VENTILATOR SETTINGS: Vent Mode:  [-] CPAP;PSV FiO2 (%):  [30 %] 30 % Set Rate:  [16 bmp] 16 bmp Vt Set:  [410 mL] 410 mL PEEP:  [5 cmH20] 5 cmH20 Pressure Support:  [10 cmH20-12 cmH20] 10 cmH20 Plateau Pressure:  [13 cmH20-14 cmH20] 13 cmH20  INTAKE / OUTPUT: I/O last 3 completed shifts: In: 7990 [I.V.:3200; Other:400; NG/GT:4290; IV Piggyback:100] Out: 4860 [Urine:4860]  PHYSICAL EXAMINATION: General: chronically ill appearing male, NAD on vent Neuro:  Awake, alert, follows commands HEENT: [place]/AT, PERRL, #6 trach midline, scant bloody secretions at site Cardiac: RRR, Nl S1/S2, -M/R/G. Chest: resps even non-labored, few scattered rhonchi  Abd: Soft, NT, ND and +BS. Ext: Intact, warm/dry   LABS:  PULMONARY  Recent Labs Lab 02/23/16 0300 02/24/16 0116  02/25/16 0426 02/25/16 0430 02/26/16 0455 02/27/16 0434 02/28/16 0439  PHART 7.531* 7.527*  --  7.479*  --   --   --   --   PCO2ART 36.3 37.4  --  41.4  --   --   --   --   PO2ART 132* 131*  --  123*  --   --   --   --   HCO3 30.3* 30.9*  --  30.4*  --   --   --   --   TCO2 31.4 32.1  --  31.7  --   --   --   --   O2SAT 98.5 98.4  < > 98.2 70.2 73.3 75.9 78.6  < > = values in this interval not displayed.  CBC  Recent Labs Lab 02/26/16 0400 02/27/16 0435 02/28/16 0440  HGB 9.3* 8.9* 8.3*  HCT 29.8* 27.9* 26.2*  WBC 12.2* 8.8 9.3  PLT 258 251 262   COAGULATION  Recent Labs Lab 02/25/16 1403  INR 1.03   CARDIAC No  results for input(s): TROPONINI in the last 168 hours. No results for input(s): PROBNP in the last 168 hours.  CHEMISTRY  Recent Labs Lab 02/22/16 0300 02/23/16 0316 02/24/16 0350 02/25/16 0440 02/26/16 0400 02/27/16 0435 02/28/16 0440  NA 155* 157* 150* 150* 148* 142 140  K 4.0 3.3* 3.4* 4.3 3.8 3.8 3.8  CL 114* 112* 111 108 107 104 102  CO2 31 30 33* GLUCOSE 134* 109* 127* 96 62* 113* 119*  BUN 46* 45* 39* 35* 30* 34* 27*  CREATININE 1.56* 1.58* 1.61* 1.67* 1.73* 1.82* 1.59*  CALCIUM 7.5* 7.8* 7.6* 7.9* 8.1* 7.9* 8.1*  MG 2.6* 2.3 2.3 2.3  --  2.2  --   PHOS 2.5 3.0 5.4* 4.5  --  4.8*  --    Estimated Creatinine Clearance: 52.5 mL/min (by C-G formula based on Cr of 1.59).  LIVER  Recent Labs Lab 02/25/16 1403  INR 1.03   INFECTIOUS No results for input(s): LATICACIDVEN, PROCALCITON in the last 168 hours.   ENDOCRINE CBG (last  3)   Recent Labs  02/27/16 2030 02/28/16 0006 02/28/16 0436  GLUCAP 139* 128* 112*   IMAGING x48h Dg Abd Portable 1v  02/26/2016  CLINICAL DATA:  Nasogastric tube placement EXAM: PORTABLE ABDOMEN - 1 VIEW COMPARISON:  None. FINDINGS: Nasogastric tube tip is near the pylorus with the side port in the gastric antrum region. Bowel gas pattern is unremarkable. No free air evident. There is consolidation in the medial left lung base. IMPRESSION: Nasogastric tube tip and side port in distal stomach. Bowel gas pattern unremarkable. Medial left lung base consolidation. Electronically Signed   By: Bretta BangWilliam  Woodruff III M.D.   On: 02/26/2016 16:40   STUDIES:  3/14 CT head > multifocal sinusitis, enlarged ventricles 3/14 LP >> glucose < 20, protein > 600, WBC 340 (88%N), 89 RBC  CULTURES: 3/14 Blood >> strep pneumo 3/14 Urine >> neg 3/14 CSF >> strep pneumo  ANTIBIOTICS: 3/14 Ampicillin >> 3/14 3/14 Vancomycin >> off 3/14 Rocephin >> Pen G weekly >>  LINES/TUBES: 3/14 ETT >> 3/24 3/15 Rt femoral CVL >> 3/24 3/15 Rt femoral  aline >> out 3/15 Rt IJ introducer >> out  3/24 RUE PICC >>   SIGNIFICANT EVENTS: 3/14  Admit, VDRF, ID/cardiology/neurology consulted 3/15  Cardiac arrest, 5 mins, Asystole > VF > ST; ARDS, septic/cardiogenic shock >> IABP; start paralytic 3/16  Remains on paralytic, pressors, inotropes. 3/18  IABP to be removed 3/18  Nimbex discontinued  3/24  Bedside trach  3/25  Weaned ~10 hours on PSV  DISCUSSION: 50 y/o reportedly homeless male, smoker, with PMH of ETOH abuse admitted 3/14 for worsening upper respiratory symptoms.  Developed progressive confusion, lactic acidosis, AKI. 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 Suffered cardiac arrest, cardiogenic shock with acute systolic CHF, ARDS 3/15.  Trach 3/24.    ASSESSMENT / PLAN:  PULMONARY A:  Acute respiratory failure 2nd to ARDS, acute pulmonary edema Tracheostomy Status Former Tobacco Abuse P:   Intermittent CXR Daily SBT  PSV wean to trach collar as tolerated  Mobilize as able PRN albuterol   CARDIOVASCULAR A:  Septic/cardiogenic shock - resolved, in setting of streptococcal bacteremia and meningitis Acute systolic CHF Cardiac arrest 3/15 NSTEMI P:  ICU monitoring  Cardiology following Continue amiodarone, coreg, bidil & spironolactone  RENAL A:   AKI 2nd to ATN. Hypokalemia Hypernatremia P:   Follow BMP Replace electrolytes as indicated Free water 300 ml q6 hours Decrease D5W to 8450ml/hr  GASTROINTESTINAL A:   Nutrition Shock liver - resolved P:   TF support  Protonix  HEMATOLOGIC A:   Anemia  Leukocytosis - resolved  P:  F/u CBC Heparin for DVT prophylaxis   INFECTIOUS A:   Streptococcal bacteremia and meningitis RPR positive P:  Day 13 ceftriaxone, decadron per ID Pen G 2.4 million units IM weekly  ENDOCRINE A:   Hyperglycemia Relative adrenal insufficiency >> cortisol 15.2 from 3/15 P:   SSI with CBG Q4 Lantus 15 units Q24  NEUROLOGIC A:   Acute metabolic encephalopathy Hx of  ETOH, THC Streptococcal meningitis  P:   Thiamine, folic acid Continue off sedation - follow MS  AUTOIMMUNE A: No prior hx of autoimmune diz P: Anca neg, ana pos, ds-dna neg results, speckled pattern noticed.   Family - no family at bedside am 3/25  Canary BrimBrandi Nekayla Heider, NP-C [place] [facility] Pgr: [phone] or if no answer [phone] 02/28/2016, 8:41 AM

## 2016-02-28 NOTE — Progress Notes (Signed)
RT placed patient on 35% ATC. Vital signs are stable at this time. No complications. Patient tolerating well at this time. Inner cannula also changed. RN made aware. RT will continue to monitor.

## 2016-02-29 ENCOUNTER — Inpatient Hospital Stay (HOSPITAL_COMMUNITY): Payer: Medicaid Other

## 2016-02-29 LAB — CBC
HCT: 25.5 % — ABNORMAL LOW (ref 39.0–52.0)
HEMOGLOBIN: 8.3 g/dL — AB (ref 13.0–17.0)
MCH: 29.6 pg (ref 26.0–34.0)
MCHC: 32.5 g/dL (ref 30.0–36.0)
MCV: 91.1 fL (ref 78.0–100.0)
PLATELETS: 294 10*3/uL (ref 150–400)
RBC: 2.8 MIL/uL — ABNORMAL LOW (ref 4.22–5.81)
RDW: 14.1 % (ref 11.5–15.5)
WBC: 8.9 10*3/uL (ref 4.0–10.5)

## 2016-02-29 LAB — COMPREHENSIVE METABOLIC PANEL
ALBUMIN: 1.7 g/dL — AB (ref 3.5–5.0)
ALK PHOS: 46 U/L (ref 38–126)
ALT: 38 U/L (ref 17–63)
ANION GAP: 10 (ref 5–15)
AST: 25 U/L (ref 15–41)
BILIRUBIN TOTAL: 0.1 mg/dL — AB (ref 0.3–1.2)
BUN: 25 mg/dL — AB (ref 6–20)
CALCIUM: 8.1 mg/dL — AB (ref 8.9–10.3)
CO2: 27 mmol/L (ref 22–32)
Chloride: 99 mmol/L — ABNORMAL LOW (ref 101–111)
Creatinine, Ser: 1.46 mg/dL — ABNORMAL HIGH (ref 0.61–1.24)
GFR calc Af Amer: >60 mL/min (ref >60)
GFR calc non Af Amer: 54 mL/min — ABNORMAL LOW (ref >60)
GLUCOSE: 202 mg/dL — AB (ref 65–99)
Potassium: 3.8 mmol/L (ref 3.5–5.1)
Sodium: 136 mmol/L (ref 135–145)
TOTAL PROTEIN: 5.1 g/dL — AB (ref 6.5–8.1)

## 2016-02-29 LAB — GLUCOSE, CAPILLARY
Glucose-Capillary: 326 mg/dL — ABNORMAL HIGH (ref 65–99)
Glucose-Capillary: 117 mg/dL — ABNORMAL HIGH (ref 65–99)

## 2016-02-29 LAB — MAGNESIUM: MAGNESIUM: 1.5 mg/dL — AB (ref 1.7–2.4)

## 2016-02-29 LAB — PHOSPHORUS: PHOSPHORUS: 3.3 mg/dL (ref 2.5–4.6)

## 2016-02-29 LAB — CARBOXYHEMOGLOBIN
Carboxyhemoglobin: 0.9 % (ref 0.5–1.5)
Methemoglobin: 0.9 % (ref 0.0–1.5)
O2 SAT: 80.9 %
Total hemoglobin: 8.6 g/dL — ABNORMAL LOW (ref 13.5–18.0)

## 2016-02-29 MED ORDER — FUROSEMIDE 10 MG/ML IJ SOLN
20.0000 mg | Freq: Once | INTRAMUSCULAR | Status: DC
  Filled 2016-02-29: qty 2

## 2016-02-29 MED ORDER — MAGNESIUM SULFATE 4 GM/100ML IV SOLN
4.0000 g | Freq: Once | INTRAVENOUS | Status: AC
  Administered 2016-02-29: 4 g via INTRAVENOUS
  Filled 2016-02-29 (×2): qty 100

## 2016-02-29 MED ORDER — AMIODARONE HCL 200 MG PO TABS
200.0000 mg | ORAL_TABLET | Freq: Every day | ORAL | Status: DC
  Administered 2016-03-01 – 2016-03-10 (×10): 200 mg via ORAL
  Filled 2016-02-29 (×10): qty 1

## 2016-02-29 NOTE — Progress Notes (Signed)
Pt having decreased spo2 and increased agitation. Pt taken off trach collar and placed back on vent on Full support. RN aware. RT will continue to monitor.

## 2016-02-29 NOTE — Progress Notes (Signed)
PULMONARY / CRITICAL CARE MEDICINE   Name: Dale [name] MRN: [id number] DOB: 1965-12-28    ADMISSION DATE:  02/16/2016   CONSULTATION DATE:  02/16/16  REFERRING MD:  Rito Ehrlich  CHIEF COMPLAINT:  AMS  SUBJECTIVE:  Pt  Desaturated overnight >> placed back on vent. On TC now. Awake, does NOT follow commands.   VITAL SIGNS: BP 112/80 mmHg  Pulse 101  Temp(Src) 98.8 F (37.1 C) (Oral)  Resp 29  Ht 5' 8.9" (1.75 m)  Wt 146 lb 6.2 oz (66.4 kg)  BMI 21.68 kg/m2  SpO2 94%  HEMODYNAMICS: CVP:  [8 mmHg-13 mmHg] 8 mmHg  VENTILATOR SETTINGS: Vent Mode:  [-] PRVC FiO2 (%):  [30 %-50 %] 50 % Set Rate:  [16 bmp] 16 bmp Vt Set:  [410 mL] 410 mL PEEP:  [5 cmH20] 5 cmH20 Pressure Support:  [10 cmH20] 10 cmH20 Plateau Pressure:  [14 cmH20-16 cmH20] 14 cmH20  INTAKE / OUTPUT: I/O last 3 completed shifts: In: 6603.3 [I.V.:2173.3; Other:440; NG/GT:3890; IV Piggyback:100] Out: 3925 [Urine:3925]  PHYSICAL EXAMINATION: General: chronically ill appearing male, NAD on vent.  Neuro:  Awake, DOES NOT FOLLOW COMMANDS.  HEENT: [place]/AT, PERRL, #6 trach midline, scant bloody secretions at site Cardiac: RRR, Nl S1/S2, -M/R/G. Chest: resps even non-labored, few scattered rhonchi  Abd: Soft, NT, ND and +BS. Ext: Intact, warm/dry   LABS:  PULMONARY  Recent Labs Lab 02/23/16 0300 02/24/16 0116  02/25/16 0426 02/25/16 0430 02/26/16 0455 02/27/16 0434 02/28/16 0439 02/29/16 0445  PHART 7.531* 7.527*  --  7.479*  --   --   --   --   --   PCO2ART 36.3 37.4  --  41.4  --   --   --   --   --   PO2ART 132* 131*  --  123*  --   --   --   --   --   HCO3 30.3* 30.9*  --  30.4*  --   --   --   --   --   TCO2 31.4 32.1  --  31.7  --   --   --   --   --   O2SAT 98.5 98.4  < > 98.2 70.2 73.3 75.9 78.6 80.9  < > = values in this interval not displayed.  CBC  Recent Labs Lab 02/27/16 0435 02/28/16 0440 02/29/16 0447  HGB 8.9* 8.3* 8.3*  HCT 27.9* 26.2* 25.5*  WBC 8.8 9.3 8.9  PLT 251 262  294   COAGULATION  Recent Labs Lab 02/25/16 1403  INR 1.03   CARDIAC No results for input(s): TROPONINI in the last 168 hours. No results for input(s): PROBNP in the last 168 hours.  CHEMISTRY  Recent Labs Lab 02/23/16 0316 02/24/16 0350 02/25/16 0440 02/26/16 0400 02/27/16 0435 02/28/16 0440 02/29/16 0447  NA 157* 150* 150* 148* 142 140 136  K 3.3* 3.4* 4.3 3.8 3.8 3.8 3.8  CL 112* 111 108 107 104 102 99*  CO2 30 33* GLUCOSE 109* 127* 96 62* 113* 119* 202*  BUN 45* 39* 35* 30* 34* 27* 25*  CREATININE 1.58* 1.61* 1.67* 1.73* 1.82* 1.59* 1.46*  CALCIUM 7.8* 7.6* 7.9* 8.1* 7.9* 8.1* 8.1*  MG 2.3 2.3 2.3  --  2.2  --   --   PHOS 3.0 5.4* 4.5  --  4.8*  --   --    Estimated Creatinine Clearance: 56.2 mL/min (by C-G formula based on Cr of 1.46).  LIVER  Recent Labs Lab 02/25/16 1403 02/29/16 0447  AST  --  25  ALT  --  38  ALKPHOS  --  46  BILITOT  --  0.1*  PROT  --  5.1*  ALBUMIN  --  1.7*  INR 1.03  --    INFECTIOUS No results for input(s): LATICACIDVEN, PROCALCITON in the last 168 hours.   ENDOCRINE CBG (last 3)   Recent Labs  02/28/16 1129 02/28/16 1517 02/28/16 1954  GLUCAP 110* 107* 104*   IMAGING x48h Dg Chest Port 1 View  02/29/2016  CLINICAL DATA:  50 year old male with acute respiratory failure EXAM: PORTABLE CHEST 1 VIEW COMPARISON:  Prior chest x-ray 02/28/2016 FINDINGS: The tracheostomy tube remains midline and at the level of the clavicles. A right upper extremity approach PICC terminates in good position overlying the superior cavoatrial junction. A nasogastric tube is present. The tip lies off the field of view but below the diaphragm and presumably within the stomach. Stable cardiac and mediastinal contours. Bilateral right larger than left layering pleural effusions have slightly enlarged. There is associated bibasilar atelectasis. Background of mild pulmonary vascular congestion. No pneumothorax. No acute osseous  abnormality. IMPRESSION: 1. Slight interval progression of right greater than left layering pleural effusions and associated bibasilar atelectasis. 2. Stable and satisfactory support apparatus. Electronically Signed   By: Malachy Moan M.D.   On: 02/29/2016 07:11   Dg Chest Port 1 View  02/28/2016  CLINICAL DATA:  Acute respiratory failure EXAM: PORTABLE CHEST 1 VIEW COMPARISON:  02/26/2016 FINDINGS: Cardiomediastinal silhouette is stable. There is tracheostomy tube in place. Endotracheal tube has been removed. NG tube is unchanged in position. No pulmonary edema. Right arm PICC line with tip in SVC right atrium junction. Mild bilateral basilar hazy atelectasis or early infiltrate. IMPRESSION: Tracheostomy tube in place. Stable NG tube position. Right arm PICC line with tip in SVC right atrium junction. Mild basilar atelectasis or infiltrate. No pulmonary edema. Electronically Signed   By: Natasha Mead M.D.   On: 02/28/2016 09:52   STUDIES:  3/14 CT head > multifocal sinusitis, enlarged ventricles 3/14 LP >> glucose < 20, protein > 600, WBC 340 (88%N), 89 RBC  CULTURES: 3/14 Blood >> strep pneumo 3/14 Urine >> neg 3/14 CSF >> strep pneumo  ANTIBIOTICS: 3/14 Ampicillin >> 3/14 3/14 Vancomycin >> off 3/14 Rocephin >> Pen G weekly >>  LINES/TUBES: 3/14 ETT >> 3/24 3/15 Rt femoral CVL >> 3/24 3/15 Rt femoral aline >> out 3/15 Rt IJ introducer >> out  3/24 RUE PICC >>   SIGNIFICANT EVENTS: 3/14  Admit, VDRF, ID/cardiology/neurology consulted 3/15  Cardiac arrest, 5 mins, Asystole > VF > ST; ARDS, septic/cardiogenic shock >> IABP; start paralytic 3/16  Remains on paralytic, pressors, inotropes. 3/18  IABP to be removed 3/18  Nimbex discontinued  3/24  Bedside trach  3/25  Weaned ~10 hours on PSV  DISCUSSION: 50 y/o reportedly homeless male, smoker, with PMH of ETOH abuse admitted 3/14 for worsening upper respiratory symptoms.  Developed progressive confusion, lactic acidosis, AKI.  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 Suffered cardiac arrest, cardiogenic shock with acute systolic CHF, ARDS 3/15.  Trach 3/24.    ASSESSMENT / PLAN:  PULMONARY A:  Acute respiratory failure 2nd to ARDS, acute pulmonary edema Tracheostomy Status Former Tobacco Abuse P:   Intermittent CXR Daily trache collar >> try to wean vent off PSV wean to trach collar as tolerated  Mobilize as able PRN albuterol   CARDIOVASCULAR A:  Septic/cardiogenic  shock - resolved, in setting of streptococcal bacteremia and meningitis Acute systolic CHF Cardiac arrest 3/15 CXR with B pleural effusion -- worse (3/27) NSTEMI P:  ICU monitoring  Cardiology following Continue amiodarone, coreg, bidil & spironolactone Lasix 20 mg IV x 1. Check mg and Phos Will d/c free water.   RENAL A:   AKI 2nd to ATN. Hypokalemia Hypernatremia Worse pleural effusion.  P:   Follow BMP Replace electrolytes as indicated Lasix 20 mg IV x 1 Will d/c free water.  GASTROINTESTINAL A:   Nutrition Shock liver - resolved P:   TF support  Protonix  HEMATOLOGIC A:   Anemia  Leukocytosis - resolved  P:  F/u CBC Heparin for DVT prophylaxis   INFECTIOUS A:   Streptococcal bacteremia and meningitis RPR positive P:  Day 14 ceftriaxone, decadron per ID Pen G 2.4 million units IM weekly  ENDOCRINE A:   Hyperglycemia Relative adrenal insufficiency >> cortisol 15.2 from 3/15 P:   SSI with CBG Q4 Lantus held 2/2 hypoglycemia  NEUROLOGIC A:   Acute metabolic encephalopathy >> clinically better.  Hx of ETOH, THC Streptococcal meningitis  P:   Thiamine, folic acid Continue off sedation - follow MS  AUTOIMMUNE A: No prior hx of autoimmune diz P: Anca neg, ana pos, ds-dna neg results, speckled pattern noticed.   Family - no family at bedside am 3/27  J. Alexis FrockAngelo A de Dios, MD 02/29/2016, 10:12 AM [facility] Pager [phone] After 3 pm or if no answer, call  [phone]

## 2016-02-29 NOTE — Progress Notes (Signed)
Patient ID: Dale [name], male   DOB: Dec 31, 1965, 50 y.o.   MRN: [id number]    SUBJECTIVE:  Off dobutamine.  ECHO EF improved 50-55%.   S/p Tracheostomy 02/26/16. Failed trach collar trial and needed to resume full vent support.   Eyes open, starting forward.  Purposeful movement. Does not respond to commands.   Scheduled Meds: . amiodarone  200 mg Oral BID  . antiseptic oral rinse  7 mL Mouth Rinse 6 times per day  . aspirin  81 mg Oral Daily  . carvedilol  6.25 mg Oral BID WC  . cefTRIAXone (ROCEPHIN)  IV  2 g Intravenous Q12H  . chlorhexidine gluconate (SAGE KIT)  15 mL Mouth Rinse BID  . folic acid  1 mg Per Tube Daily  . free water  300 mL Per Tube Q6H  . heparin subcutaneous  5,000 Units Subcutaneous 3 times per day  . isosorbide-hydrALAZINE  2 tablet Oral TID  . pantoprazole sodium  40 mg Per Tube Q24H  . penicillin g benzathine (BICILLIN-LA) IM  2.4 Million Units Intramuscular Weekly  . propofol  5-80 mcg/kg/min Intravenous Once  . sodium chloride flush  10-40 mL Intracatheter Q12H  . sodium chloride flush  10-40 mL Intracatheter Q12H  . spironolactone  25 mg Per Tube Daily  . thiamine  100 mg Per Tube Daily   Continuous Infusions: . dextrose 50 mL/hr at 02/28/16 1521  . feeding supplement (VITAL AF 1.2 CAL) 1,000 mL (02/28/16 2239)   PRN Meds:.acetaminophen (TYLENOL) oral liquid 160 mg/5 mL, albuterol, fentaNYL (SUBLIMAZE) injection, sodium chloride flush, sodium chloride flush    Filed Vitals:   02/29/16 0448 02/29/16 0500 02/29/16 0600 02/29/16 0700  BP:  114/78 125/83 103/73  Pulse:  97 94 90  Temp:  99 F (37.2 C)    TempSrc:  Oral    Resp:  _0 Height:      Weight: 146 lb 6.2 oz (66.4 kg)     SpO2:  99% 100% 100%    Intake/Output Summary (Last 24 hours) at 02/29/16 0750 Last data filed at 02/29/16 0700  Gross per 24 hour  Intake 4048.33 ml  Output   2250 ml  Net 1798.33 ml    LABS: Basic Metabolic Panel:  Recent Labs  02/27/16 0435  02/28/16 0440 02/29/16 0447  NA 142 140 136  K 3.8 3.8 3.8  CL 104 102 99*  CO2 _1 GLUCOSE 113* 119* 202*  BUN 34* 27* 25*  CREATININE 1.82* 1.59* 1.46*  CALCIUM 7.9* 8.1* 8.1*  MG 2.2  --   --   PHOS 4.8*  --   --    Liver Function Tests:  Recent Labs  02/29/16 0447  AST 25  ALT 38  ALKPHOS 46  BILITOT 0.1*  PROT 5.1*  ALBUMIN 1.7*   No results for input(s): LIPASE, AMYLASE in the last 72 hours. CBC:  Recent Labs  02/28/16 0440 02/29/16 0447  WBC 9.3 8.9  HGB 8.3* 8.3*  HCT 26.2* 25.5*  MCV 93.2 91.1  PLT 262 294   Cardiac Enzymes: No results for input(s): CKTOTAL, CKMB, CKMBINDEX, TROPONINI in the last 72 hours. BNP: Invalid input(s): POCBNP D-Dimer: No results for input(s): DDIMER in the last 72 hours. Hemoglobin A1C: No results for input(s): HGBA1C in the last 72 hours. Fasting Lipid Panel: No results for input(s): CHOL, HDL, LDLCALC, TRIG, CHOLHDL, LDLDIRECT in the last 72 hours. Thyroid Function Tests: No results for input(s): TSH, T4TOTAL,  T3FREE, THYROIDAB in the last 72 hours.  Invalid input(s): FREET3 Anemia Panel: No results for input(s): VITAMINB12, FOLATE, FERRITIN, TIBC, IRON, RETICCTPCT in the last 72 hours.  RADIOLOGY: Dg Chest 2 View  02/13/2016  CLINICAL DATA:  50 year old male with history of anterior chest pain for the past 2 days. Low-grade fever. Dizziness. EXAM: CHEST  2 VIEW COMPARISON:  Chest x-ray 06/01/2009. FINDINGS: Lung volumes are normal. No consolidative airspace disease. No pleural effusions. No pneumothorax. No pulmonary nodule or mass noted. Pulmonary vasculature and the cardiomediastinal silhouette are within normal limits. IMPRESSION: No radiographic evidence of acute cardiopulmonary disease. Electronically Signed   By: Vinnie Langton M.D.   On: 02/13/2016 17:57   Dg Thoracic Spine 2 View  02/13/2016  CLINICAL DATA:  O thoracic and lower back pain for 1 week. Fall 2 days ago. Pain was before the fall. EXAM:  THORACIC SPINE 2 VIEWS COMPARISON:  Chest x-ray today and 06/01/2009 FINDINGS: There is no evidence of thoracic spine fracture. Alignment is normal. No other significant bone abnormalities are identified. IMPRESSION: Negative. Electronically Signed   By: Rolm Baptise M.D.   On: 02/13/2016 20:45   Dg Lumbar Spine Complete  02/13/2016  CLINICAL DATA:  Low back pain and recent fall. EXAM: LUMBAR SPINE - COMPLETE 4+ VIEW COMPARISON:  08/25/2014 lumbar spine radiographs. FINDINGS: This report assumes 5 non rib-bearing lumbar vertebrae. Lumbar vertebral body heights are preserved, with no fracture. There is mild loss of disc height at L4-5 with associated minimal spondylosis, not appreciably changed. Otherwise preserved lumbar disc spaces. There is new 3 mm anterolisthesis at L4-5. No appreciable facet arthropathy. No appreciable foraminal stenosis. No aggressive appearing focal osseous lesions. IMPRESSION: 1. No lumbar spine fracture. 2. Stable mild degenerative disc disease at L4-5. 3. New mild 3 mm anterolisthesis at L4-5. Electronically Signed   By: Ilona Sorrel M.D.   On: 02/13/2016 20:47   Ct Head Wo Contrast  02/16/2016  CLINICAL DATA:  Fever and altered mental status; tremors EXAM: CT HEAD WITHOUT CONTRAST TECHNIQUE: Contiguous axial images were obtained from the base of the skull through the vertex without intravenous contrast. COMPARISON:  June 01, 2009 FINDINGS: The ventricles are mildly enlarged in a generalized manner and appear larger compared to the prior study. The sulci appear unremarkable. There is no intracranial mass, hemorrhage, extra-axial fluid collection, or midline shift. No focal gray-white compartment lesions are identified. No evidence of acute infarct. Bony calvarium appears intact. The mastoid air cells are clear. There is opacification in the left maxillary antrum as well as in multiple ethmoid air cells bilaterally. There is mild mucosal thickening in the left sphenoid sinus region.  No intraorbital lesions are apparent. IMPRESSION: Ventricles mildly enlarged with sulci appearing normal. This appearance potentially could represent a degree of normal pressure hydrocephalus. This finding also could be secondary to prior or recent meningitis meningitis. No intracranial mass, hemorrhage, or extra-axial fluid collection. No acute infarct evident. Multifocal sinusitis. Given these current findings, brain MRI pre and post-contrast may be helpful to further assess, in particular to further evaluate the meningeal regions. Electronically Signed   By: Lowella Grip III M.D.   On: 02/16/2016 14:04   Dg Chest Port 1 View  02/29/2016  CLINICAL DATA:  50 year old male with acute respiratory failure EXAM: PORTABLE CHEST 1 VIEW COMPARISON:  Prior chest x-ray 02/28/2016 FINDINGS: The tracheostomy tube remains midline and at the level of the clavicles. A right upper extremity approach PICC terminates in good position overlying the superior  cavoatrial junction. A nasogastric tube is present. The tip lies off the field of view but below the diaphragm and presumably within the stomach. Stable cardiac and mediastinal contours. Bilateral right larger than left layering pleural effusions have slightly enlarged. There is associated bibasilar atelectasis. Background of mild pulmonary vascular congestion. No pneumothorax. No acute osseous abnormality. IMPRESSION: 1. Slight interval progression of right greater than left layering pleural effusions and associated bibasilar atelectasis. 2. Stable and satisfactory support apparatus. Electronically Signed   By: Jacqulynn Cadet M.D.   On: 02/29/2016 07:11   Dg Chest Port 1 View  02/28/2016  CLINICAL DATA:  Acute respiratory failure EXAM: PORTABLE CHEST 1 VIEW COMPARISON:  02/26/2016 FINDINGS: Cardiomediastinal silhouette is stable. There is tracheostomy tube in place. Endotracheal tube has been removed. NG tube is unchanged in position. No pulmonary edema. Right arm  PICC line with tip in SVC right atrium junction. Mild bilateral basilar hazy atelectasis or early infiltrate. IMPRESSION: Tracheostomy tube in place. Stable NG tube position. Right arm PICC line with tip in SVC right atrium junction. Mild basilar atelectasis or infiltrate. No pulmonary edema. Electronically Signed   By: Lahoma Crocker M.D.   On: 02/28/2016 09:52   Dg Chest Portable 1 View  02/26/2016  CLINICAL DATA:  ARDS EXAM: PORTABLE CHEST 1 VIEW COMPARISON:  02/25/2016 FINDINGS: Cardiac shadow is stable. A nasogastric catheter and endotracheal tube are again seen in stable position. The lungs are well aerated bilaterally. Persistent left retrocardiac atelectasis and small effusion are noted. No new focal abnormality is seen. IMPRESSION: Stable changes in the left base. Electronically Signed   By: Inez Catalina M.D.   On: 02/26/2016 07:24   Dg Chest Port 1 View  02/25/2016  CLINICAL DATA:  Intubated patient, encephalopathy and meningitis, septic shock, CHF. EXAM: PORTABLE CHEST 1 VIEW COMPARISON:  Portable chest x-ray of February 24, 2016 FINDINGS: The lungs are well-expanded. There is persistent left lower lobe atelectasis or pneumonia and trace left pleural effusion. The right lung is clear. The heart and pulmonary vascularity are normal. The endotracheal tube tip lies approximately 2 cm above the carina. The esophagogastric tube tip projects below the inferior margin of the image. IMPRESSION: Somewhat low positioning of the endotracheal tube with. Withdrawal by 2 cm is recommended to avoid accidental mainstem bronchus intubation. Stable left lower lobe atelectasis and small left pleural effusion. Electronically Signed   By: David  Martinique M.D.   On: 02/25/2016 07:24   Dg Chest Port 1 View  02/24/2016  CLINICAL DATA:  Endotracheal tube assessment. EXAM: PORTABLE CHEST 1 VIEW COMPARISON:  02/23/2016 FINDINGS: Endotracheal tube is present with tip measuring 2.5 cm above the carinal. Enteric tube tip is off the  field of view but below the left hemidiaphragm. Shallow inspiration. Heart size and pulmonary vascularity are normal. Mild serration of left hemidiaphragm suggesting infiltration and/ or effusion. No pneumothorax. IMPRESSION: Endotracheal tube tip measures 2.5 cm above the carina. Infiltration and/ or effusion in the left lung base. Electronically Signed   By: Lucienne Capers M.D.   On: 02/24/2016 01:34   Dg Chest Port 1 View  02/23/2016  CLINICAL DATA:  Shortness of breath. EXAM: PORTABLE CHEST 1 VIEW COMPARISON:  02/22/2016. FINDINGS: Endotracheal tube tip 1.8 cm above the carina, slight proximal repositioning of 1 a 2 cm should be considered. NG tube noted with tip below left hemidiaphragm. Heart size stable. Bilateral pulmonary infiltrates, left side greater right noted. Small left pleural effusion cannot be excluded. No pneumothorax . IMPRESSION:  1. Endotracheal tube tip 1.8 cm above the carina, slight proximal repositioning of 1 a 2 cm should be considered. NG tube in stable position. 2. Diffuse left lung and right lower lobe pulmonary infiltrates noted. These are new from prior exam. Small left pleural effusion cannot be excluded . Electronically Signed   By: Marcello Moores  Register   On: 02/23/2016 07:31   Dg Chest Port 1 View  02/22/2016  CLINICAL DATA:  Respiratory failure. EXAM: PORTABLE CHEST 1 VIEW COMPARISON:  02/21/2016. FINDINGS: Endotracheal tube, NG tube, right IJ sheath in stable position. Interim removal Swan-Ganz catheter. Heart size stable. Low lung volumes with mild bibasilar atelectasis and or infiltrates. Interim improvement from prior exam. Small left pleural effusion cannot be excluded. No pneumothorax. IMPRESSION: 1. Level of Swan-Ganz catheter. Endotracheal tube, NG tube, right IJ sheath in stable position. 2. Low lung volumes with mild bibasilar atelectasis and/or infiltrates. Interim improvement from prior exam. Small left pleural effusion cannot be excluded. Electronically Signed    By: Marcello Moores  Register   On: 02/22/2016 07:13   Dg Chest Port 1 View  02/21/2016  CLINICAL DATA:  Respiratory failure EXAM: PORTABLE CHEST 1 VIEW COMPARISON:  Chest radiograph from one day prior. FINDINGS: Right internal jugular Swan-Ganz catheter terminates in the right infrahilar region. Enteric tube enters the stomach with the tip not seen on this image. Endotracheal tube tip is 2.5 cm above the carina. Stable cardiomediastinal silhouette with normal heart size. No pneumothorax. Stable small left pleural effusion. No pulmonary edema. Stable left lower lobe consolidation. IMPRESSION: 1. Well-positioned endotracheal tube. Right internal jugular Swan-Ganz catheter terminates over the right infrahilar region, consider retracting 2-3 cm. 2. Stable small left pleural effusion. 3. Stable left lower lobe consolidation, favor atelectasis. Electronically Signed   By: Ilona Sorrel M.D.   On: 02/21/2016 08:00   Dg Chest Port 1 View  02/20/2016  CLINICAL DATA:  CHF EXAM: PORTABLE CHEST 1 VIEW COMPARISON:  Chest radiograph from one day prior. FINDINGS: Right internal jugular Swan-Ganz catheter terminates over the central right lower lung, unchanged. Endotracheal tube tip is 2.4 cm above the carina. Enteric tube enters the stomach with the tip not seen on this image. Intra-aortic balloon pump marker terminates over the proximal descending thoracic aorta at the T7-8 level. Stable cardiomediastinal silhouette with normal heart size. No pneumothorax. No pleural effusion. Hazy opacities throughout the bilateral parahilar lungs are not appreciably changed. Probable complete left lower lobe atelectasis, unchanged. IMPRESSION: 1. Right internal jugular Swan-Ganz catheter terminates over the central lower right lung, consider retracting 3-4 cm . 2. Otherwise well-positioned support structures as described. 3. Stable hazy opacities throughout the parahilar lungs, favor pulmonary edema. Electronically Signed   By: Ilona Sorrel M.D.    On: 02/20/2016 09:34   Dg Chest Port 1 View  02/19/2016  CLINICAL DATA:  CHF EXAM: PORTABLE CHEST 1 VIEW COMPARISON:  02/18/2016 FINDINGS: Support devices remain in place, unchanged. Heart is normal size. Diffuse interstitial and alveolar opacities are again noted, likely edema. This is somewhat more confluent in both lung bases, left greater than right. Cannot completely exclude infiltrate at the left base. No visible effusions. No acute bony abnormality. IMPRESSION: Continued diffuse interstitial and alveolar opacities, likely edema. Somewhat more focal opacities in the lung bases, left greater than right. Cannot exclude pneumonia. Electronically Signed   By: Rolm Baptise M.D.   On: 02/19/2016 08:29   Dg Chest Port 1 View  02/18/2016  CLINICAL DATA:  Entry aortic balloon pump asist. EXAM:  PORTABLE CHEST 1 VIEW COMPARISON:  February 17, 2016. FINDINGS: The heart size and mediastinal contours are within normal limits. No pneumothorax or pleural effusion is noted. Endotracheal nasogastric tubes are unchanged in position. Right internal jugular Swan-Ganz catheter is unchanged with distal tip in expected position of right pulmonary artery. Distal tip of aortic balloon catheter is seen over expected position of superior portion of descending thoracic aorta just below aortic knob. Stable bilateral perihilar opacities are noted concerning for edema or inflammation. The visualized skeletal structures are unremarkable. IMPRESSION: Distal tip of aortic balloon catheter appears to be slightly more inferior in position compared to prior exam, just below aortic knob currently. This projects over the expected position of the superior aspect of descending thoracic aorta. Stable bilateral perihilar opacities are noted concerning for edema or inflammation. Otherwise stable support apparatus. Electronically Signed   By: Marijo Conception, M.D.   On: 02/18/2016 07:24   Dg Chest Port 1 View  02/17/2016  CLINICAL DATA:  Ventilator  dependence. EXAM: PORTABLE CHEST 1 VIEW COMPARISON:  Earlier the same day. FINDINGS: 1822 hours. Endotracheal tube tip is 7.4 cm above the base of the carina. Right IJ pulmonary artery catheter tip is in the interlobar pulmonary artery. The tip of the intra-aortic balloon pump projects over the inferior aspect of the transverse aorta. The NG tube passes into the stomach although the distal tip position is not included on the film. Proximal port of the NG tube is at or just above the EG junction. Diffuse interstitial and central alveolar opacities suggest edema. Telemetry leads overlie the chest. IMPRESSION: The tip of the intra-aortic balloon pump projects over the inferior aspect of the transverse aorta. Electronically Signed   By: Misty Stanley M.D.   On: 02/17/2016 18:34   Dg Chest Port 1 View  02/17/2016  CLINICAL DATA:  50 year old male with a history of Swan-Ganz catheter placement. EXAM: PORTABLE CHEST 1 VIEW COMPARISON:  02/17/2016 FINDINGS: Cardiomediastinal silhouette unchanged in size and contour. Skull pads again project over the thorax. Unchanged endotracheal tube, gastric tube. Interval placement of right IJ sheath, through which a Swan-Ganz catheter has been placed terminating in the right pulmonary arteries. Persisting bilateral airspace opacity IMPRESSION: Interval placement of right IJ sheath, through which a Swan-Ganz catheter has been placed and terminates in the right pulmonary arteries at the hilum. Similar appearance of bilateral airspace opacities, potentially infection and/or edema. Unchanged support apparatus as above. Signed, Dulcy Fanny. Earleen Newport, DO [facility] Electronically Signed   By: Corrie Mckusick D.O.   On: 02/17/2016 13:53   Dg Chest Port 1 View  02/17/2016  CLINICAL DATA:  Hypoxia EXAM: PORTABLE CHEST 1 VIEW COMPARISON:  February 16, 2016 FINDINGS: Endotracheal tube tip is 3.5 cm above the carina. Nasogastric tube tip and  side port are in the stomach. No pneumothorax evident. There is interstitial edema throughout the lungs bilaterally, perhaps slightly increased from 1 day prior. No airspace consolidation. Heart is upper normal in size with pulmonary vascularity within normal limits. No adenopathy. IMPRESSION: Tube positions as described without pneumothorax. Slight increase in interstitial edema. No airspace consolidation. No change in cardiac silhouette. Electronically Signed   By: Lowella Grip III M.D.   On: 02/17/2016 07:31   Dg Chest Portable 1 View  02/16/2016  CLINICAL DATA:  Endotracheal tube placement EXAM: PORTABLE CHEST 1 VIEW COMPARISON:  Chest x-ray from earlier same day. FINDINGS: Endotracheal tube well positioned with tip approximately 2 cm above the  carina. Enteric tube passes below the diaphragm. Heart size is normal. There is central pulmonary vascular congestion and mild interstitial edema. Suspect small left pleural effusion. No pneumothorax. Osseous structures about the chest are unremarkable. IMPRESSION: 1. Endotracheal tube well positioned with tip approximately 2 cm above the carina. 2. Central pulmonary vascular congestion and mild bilateral interstitial edema suggesting mild volume overload/resuscitation efforts. No frank alveolar pulmonary edema. Electronically Signed   By: Franki Cabot M.D.   On: 02/16/2016 19:38   Dg Chest Port 1 View  02/16/2016  CLINICAL DATA:  Altered mental status.  Depression. EXAM: PORTABLE CHEST 1 VIEW COMPARISON:  02/13/2016. FINDINGS: Midline trachea. Normal heart size and mediastinal contours. Left costophrenic angle exclusion. No pleural effusion or pneumothorax. Clear lungs. IMPRESSION: No active disease. Electronically Signed   By: Abigail Miyamoto M.D.   On: 02/16/2016 13:48   Dg Abd Portable 1v  02/26/2016  CLINICAL DATA:  Nasogastric tube placement EXAM: PORTABLE ABDOMEN - 1 VIEW COMPARISON:  None. FINDINGS: Nasogastric tube tip is near the pylorus with the  side port in the gastric antrum region. Bowel gas pattern is unremarkable. No free air evident. There is consolidation in the medial left lung base. IMPRESSION: Nasogastric tube tip and side port in distal stomach. Bowel gas pattern unremarkable. Medial left lung base consolidation. Electronically Signed   By: Lowella Grip III M.D.   On: 02/26/2016 16:40   Ct Portable Head W/o Cm  02/17/2016  CLINICAL DATA:  50 year old male with a history of hydrocephalus. Acute meningitis. EXAM: CT HEAD WITHOUT CONTRAST TECHNIQUE: Contiguous axial images were obtained from the base of the skull through the vertex without intravenous contrast. COMPARISON:  02/16/2016 FINDINGS: Unremarkable appearance of the calvarium without acute fracture or aggressive lesion. Unremarkable appearance of the scalp soft tissues. Unremarkable appearance of the bilateral orbits. Partially visualized endotracheal tube.  Oral enteric tube. Near complete opacification of the left maxillary sinus with frothy secretions. Trace secretions of the right maxillary sinus. Mucoperiosteal thickening of the ethmoid air cells and the sphenoid sinuses. Trace disease of the frontal sinuses. Mastoid air cells are clear. Layered fluid within the nasopharynx. Configuration the ventricles is similar to comparison with bifrontal diameter measuring 40 mm, unchanged from prior. Unchanged appearance of the hemispheric sulci. No acute intracranial hemorrhage.  No midline shift. Gray-white differentiation relatively maintained. IMPRESSION: Unchanged ventricular dilation and appearance of the cerebral sulci, with no evidence of acute hemorrhage or infarction. Signed, Dulcy Fanny. Earleen Newport, DO [facility] Electronically Signed   By: Corrie Mckusick D.O.   On: 02/17/2016 07:23    PHYSICAL EXAM General: Tracheostomy in place Neck: JVP not elevated, no thyromegaly or thyroid nodule. RIJ  Lungs: Course throughout CV:  Nondisplaced PMI.  Heart regular S1/S2, no S3, no murmur.  No peripheral edema.  Abdomen: Soft, no hepatosplenomegaly, no distention.  Neurologic:  Eyes open, Tracking with eyes. Flat affect Extremities: No clubbing or cyanosis.  GU: Foley.   TELEMETRY: Reviewed telemetry pt in NSR in 80-90s  ASSESSMENT AND PLAN: 50 yo with minimal past medical history was admitted with confusion, prior URI symptoms.  Had LP concerning for meningitis and found to have strep bacteremia/meningitis.  Later developed shock requiring pressors and was intubated for airway protection.  Bedside echo showed EF about 10%.  Swan placed, noted cardiogenic shock and had IABP.   1. Shock: Primarily appears to have cardiogenic shock.  Cardiac output much improved, IABP out 3/18.   CO-OX 80.9%. Off  dobutamine.  2. Acute hypoxemic respiratory failure: CXR looked like pulmonary edema.   3. ID: Strep bacteremia with meningitis.  On ceftriaxone. WBC 8.3     Also treating for syphilis with weekly Pen-G.  4. AKI: Creatinine stable.  5. Acute systolic CHF: EF 72-25% on 3/16 echo.  Repeat ECHO with EF 50-55%.  - Continue spironolactone - Continue bidil 2 tab tid. - Continue Coreg 6.25 mg bid.  6. Elevated LFTs: Suspect shock liver. LFTs improved.  7. Cardiac arrest: PEA arrest then ventricular fibrillation in ER.  Now on po amiodarone in NSR.  8. Neuro: Eyes open. Tracking with eyes.   9. Hypernatremia:  Sodium 136 .   Shirley Friar PA-C 02/29/2016 7:50 AM   Advanced Heart Failure Team Pager [phone] (M-F; 7a - 4p)  Please contact [facility] for night-coverage after hours (4p -7a ) and weekends on [email or web address]  Patient seen with PA, agree with the above note.  Still not following commands/responding.  He is hemodynamically stable.  Can decrease amiodarone to 200 mg daily.     We will sign off at this point, call with questions.   Loralie Champagne 02/29/2016 11:24 AM

## 2016-03-01 LAB — BASIC METABOLIC PANEL
Anion gap: 7 (ref 5–15)
BUN: 26 mg/dL — AB (ref 6–20)
CHLORIDE: 100 mmol/L — AB (ref 101–111)
CO2: 26 mmol/L (ref 22–32)
CREATININE: 1.42 mg/dL — AB (ref 0.61–1.24)
Calcium: 8.1 mg/dL — ABNORMAL LOW (ref 8.9–10.3)
GFR calc Af Amer: >60 mL/min (ref >60)
GFR calc non Af Amer: 56 mL/min — ABNORMAL LOW (ref >60)
Glucose, Bld: 316 mg/dL — ABNORMAL HIGH (ref 65–99)
Potassium: 3.9 mmol/L (ref 3.5–5.1)
SODIUM: 133 mmol/L — AB (ref 135–145)

## 2016-03-01 LAB — GLUCOSE, CAPILLARY
GLUCOSE-CAPILLARY: 103 mg/dL — AB (ref 65–99)
Glucose-Capillary: 107 mg/dL — ABNORMAL HIGH (ref 65–99)
Glucose-Capillary: 46 mg/dL — ABNORMAL LOW (ref 65–99)
Glucose-Capillary: 105 mg/dL — ABNORMAL HIGH (ref 65–99)

## 2016-03-01 LAB — CARBOXYHEMOGLOBIN
CARBOXYHEMOGLOBIN: 0.7 % (ref 0.5–1.5)
METHEMOGLOBIN: 0.7 % (ref 0.0–1.5)
O2 Saturation: 68.3 %
Total hemoglobin: 8 g/dL — ABNORMAL LOW (ref 13.5–18.0)

## 2016-03-01 LAB — MAGNESIUM: Magnesium: 2.7 mg/dL — ABNORMAL HIGH (ref 1.7–2.4)

## 2016-03-01 LAB — PHOSPHORUS: Phosphorus: 5.3 mg/dL — ABNORMAL HIGH (ref 2.5–4.6)

## 2016-03-01 MED ORDER — ANTISEPTIC ORAL RINSE SOLUTION (CORINZ)
7.0000 mL | Freq: Four times a day (QID) | OROMUCOSAL | Status: DC
  Administered 2016-03-01 – 2016-03-09 (×30): 7 mL via OROMUCOSAL

## 2016-03-01 MED ORDER — CHLORHEXIDINE GLUCONATE 0.12% ORAL RINSE (MEDLINE KIT)
15.0000 mL | Freq: Two times a day (BID) | OROMUCOSAL | Status: DC
  Administered 2016-03-01 – 2016-03-10 (×19): 15 mL via OROMUCOSAL

## 2016-03-01 MED ORDER — FUROSEMIDE 10 MG/ML IJ SOLN
20.0000 mg | Freq: Once | INTRAMUSCULAR | Status: AC
  Administered 2016-03-01: 20 mg via INTRAVENOUS
  Filled 2016-03-01: qty 2

## 2016-03-01 MED ORDER — INSULIN ASPART 100 UNIT/ML ~~LOC~~ SOLN
0.0000 [IU] | SUBCUTANEOUS | Status: DC
  Administered 2016-03-06 – 2016-03-08 (×7): 0 [IU] via SUBCUTANEOUS

## 2016-03-01 NOTE — Clinical Social Work Note (Signed)
Clinical Social Work Assessment  Patient Details  Name: Bonnita HollowCraig D Lemay MRN: [id number] Date of Birth: 07/30/1966  Date of referral:  03/01/16               Reason for consult:  Facility Placement, Discharge Planning                Permission sought to share information with:    Permission granted to share information::     Name::        Agency::     Relationship::     Contact Information:     Housing/Transportation Living arrangements for the past 2 months:  Homeless Source of Information:  Spouse Patient Interpreter Needed:  None Criminal Activity/Legal Involvement Pertinent to Current Situation/Hospitalization:  No - Comment as needed Significant Relationships:  Spouse, Adult Children Lives with:  Self Do you feel safe going back to the place where you live?  No Need for family participation in patient care:  Yes (Comment)  Care giving concerns:  Prior to admission, Patient was homeless with poor medical follow up. Patient also appears to have a limited support system.    Social Worker assessment / plan:  Patient is a [name] African American male who was admitted due to acute respiratory failure, suffered cardiac arrest, and was then intubated and later trached. Prior to admission, Patient was homeless with a PMH of ETOH abuse. CSW engaged with Patient's wife Mickeal SkinnerShirley Mackowski ([phone]) via T/C (Patient is intubated and following minimal commands) as she reports that she will not be coming to the hospital on today. CSW introduced self, role of CSW, and initiated discussion of discharge planning. CSW informed Mrs. Seivert that due to Patient being placed back on the ventilator, and with no insurance, Patient's vent SNF options are very limited and placement will be out of state. Patient's wife reports that out of state placement is not an option. Patient's wife agreed to meet with CSW on tomorrow 03/02/2016 at 10 AM. CSW will request MD and RN Case Manager be present if possible  to assist with facilitating conversation re: disposition and medical necessity. CSW will continue to follow for disposition.   Employment status:  Unemployed Health and safety inspectornsurance information:  Self Pay (Medicaid Pending) PT Recommendations:  Skilled Nursing Facility, 24 Hour Supervision Information / Referral to community resources:  Skilled Nursing Facility  Patient/Family's Response to care:  Patient's family is adamant that they will not accept out of state placement despite limitations on Vent SNF placement options.   Patient/Family's Understanding of and Emotional Response to Diagnosis, Current Treatment, and Prognosis:  Patient's family appears to lack understanding of Patient's current diagnosis, current treatment, and prognosis. Patient's expectations of discharge appears unrealistic at this time.   Emotional Assessment Appearance:  Appears stated age Attitude/Demeanor/Rapport:  Intubated (Following Commands or Not Following Commands) Affect (typically observed):  Unable to Assess Orientation:   (Unable to Assess; Patient is intubated and minimially responsive) Alcohol / Substance use:  Alcohol Use, Tobacco Use, Illicit Drugs (Marijuana) Psych involvement (Current and /or in the community):  No (Comment)  Discharge Needs  Concerns to be addressed:  Discharge Planning Concerns, Financial / Insurance Concerns Readmission within the last 30 days:  Yes ([facility] Current discharge risk:  Dependent with Mobility, Homeless, Inadequate Financial Supports Barriers to Discharge:  Vent Bed not available, Family Issues   Rockwell Germanyshley N Gardner, LCSW 03/01/2016, 12:28 PM

## 2016-03-01 NOTE — Progress Notes (Signed)
PULMONARY / CRITICAL CARE MEDICINE   Name: Dale [name] MRN: [id number] DOB: January 23, 1966    ADMISSION DATE:  02/16/2016   CONSULTATION DATE:  02/16/16  REFERRING MD:  Rito Ehrlich  CHIEF COMPLAINT:  AMS  SUBJECTIVE:  Patient requiring vent support when necessary for distress, desaturation. Copious trach secretions. On event last night. Elevated sugars.On 60% Fio2 TC   VITAL SIGNS: BP 96/72 mmHg  Pulse 61  Temp(Src) 97.5 F (36.4 C) (Axillary)  Resp 17  Ht 5' 8.9" (1.75 m)  Wt 147 lb 11.3 oz (67 kg)  BMI 21.88 kg/m2  SpO2 100%  HEMODYNAMICS: CVP:  [10 mmHg-18 mmHg] 13 mmHg  VENTILATOR SETTINGS: Vent Mode:  [-] PRVC FiO2 (%):  [40 %-80 %] 40 % Set Rate:  [16 bmp] 16 bmp Vt Set:  [410 mL] 410 mL PEEP:  [5 cmH20] 5 cmH20 Plateau Pressure:  [12 cmH20-18 cmH20] 12 cmH20  INTAKE / OUTPUT: I/O last 3 completed shifts: In: 5380 [I.V.:1810; Other:40; NG/GT:3430; IV Piggyback:100] Out: 3175 [Urine:3075; Stool:100]  PHYSICAL EXAMINATION: General: chronically ill appearing male, NAD on vent.  Neuro:  Awake, follows commands  HEENT: [place]/AT, PERRL, #6 trach midline, thick  Bloody purulent secretions at site Cardiac: RRR, Nl S1/S2, -M/R/G. Chest: resps even non-labored,  rhonchi BLF.  Abd: Soft, NT, ND and +BS. Ext: Intact, warm/dry   LABS:  PULMONARY  Recent Labs Lab 02/24/16 0116  02/25/16 0426  02/26/16 0455 02/27/16 0434 02/28/16 0439 02/29/16 0445 03/01/16 0430  PHART 7.527*  --  7.479*  --   --   --   --   --   --   PCO2ART 37.4  --  41.4  --   --   --   --   --   --   PO2ART 131*  --  123*  --   --   --   --   --   --   HCO3 30.9*  --  30.4*  --   --   --   --   --   --   TCO2 32.1  --  31.7  --   --   --   --   --   --   O2SAT 98.4  < > 98.2  < > 73.3 75.9 78.6 80.9 68.3  < > = values in this interval not displayed.  CBC  Recent Labs Lab 02/27/16 0435 02/28/16 0440 02/29/16 0447  HGB 8.9* 8.3* 8.3*  HCT 27.9* 26.2* 25.5*  WBC 8.8 9.3 8.9  PLT 251  262 294   COAGULATION  Recent Labs Lab 02/25/16 1403  INR 1.03   CARDIAC No results for input(s): TROPONINI in the last 168 hours. No results for input(s): PROBNP in the last 168 hours.  CHEMISTRY  Recent Labs Lab 02/24/16 0350 02/25/16 0440 02/26/16 0400 02/27/16 0435 02/28/16 0440 02/29/16 0447 02/29/16 1045 03/01/16 0425  NA 150* 150* 148* 142 140 136  --  133*  K 3.4* 4.3 3.8 3.8 3.8 3.8  --  3.9  CL 111 108 107 104 102 99*  --  100*  CO2 33* --  26  GLUCOSE 127* 96 62* 113* 119* 202*  --  316*  BUN 39* 35* 30* 34* 27* 25*  --  26*  CREATININE 1.61* 1.67* 1.73* 1.82* 1.59* 1.46*  --  1.42*  CALCIUM 7.6* 7.9* 8.1* 7.9* 8.1* 8.1*  --  8.1*  MG 2.3 2.3  --  2.2  --   --  1.5*  --   PHOS 5.4* 4.5  --  4.8*  --   --  3.3  --    Estimated Creatinine Clearance: 58.3 mL/min (by C-G formula based on Cr of 1.42).  LIVER  Recent Labs Lab 02/25/16 1403 02/29/16 0447  AST  --  25  ALT  --  38  ALKPHOS  --  46  BILITOT  --  0.1*  PROT  --  5.1*  ALBUMIN  --  1.7*  INR 1.03  --    INFECTIOUS No results for input(s): LATICACIDVEN, PROCALCITON in the last 168 hours.   ENDOCRINE CBG (last 3)   Recent Labs  02/28/16 1517 02/28/16 1954 02/29/16 1950  GLUCAP 107* 104* 117*   IMAGING x48h Dg Chest Port 1 View  02/29/2016  CLINICAL DATA:  50 year old male with acute respiratory failure EXAM: PORTABLE CHEST 1 VIEW COMPARISON:  Prior chest x-ray 02/28/2016 FINDINGS: The tracheostomy tube remains midline and at the level of the clavicles. A right upper extremity approach PICC terminates in good position overlying the superior cavoatrial junction. A nasogastric tube is present. The tip lies off the field of view but below the diaphragm and presumably within the stomach. Stable cardiac and mediastinal contours. Bilateral right larger than left layering pleural effusions have slightly enlarged. There is associated bibasilar atelectasis. Background of mild  pulmonary vascular congestion. No pneumothorax. No acute osseous abnormality. IMPRESSION: 1. Slight interval progression of right greater than left layering pleural effusions and associated bibasilar atelectasis. 2. Stable and satisfactory support apparatus. Electronically Signed   By: Malachy Moan M.D.   On: 02/29/2016 07:11   STUDIES:  3/14 CT head > multifocal sinusitis, enlarged ventricles 3/14 LP >> glucose < 20, protein > 600, WBC 340 (88%N), 89 RBC  CULTURES: 3/14 Blood >> strep pneumo 3/14 Urine >> neg 3/14 CSF >> strep pneumo  ANTIBIOTICS: 3/14 Ampicillin >> 3/14 3/14 Vancomycin >> off 3/14 Rocephin >> off Pen G weekly >>  LINES/TUBES: 3/14 ETT >> 3/24 3/15 Rt femoral CVL >> 3/24 3/15 Rt femoral aline >> out 3/15 Rt IJ introducer >> out  3/24 RUE PICC >>   SIGNIFICANT EVENTS: 3/14  Admit, VDRF, ID/cardiology/neurology consulted 3/15  Cardiac arrest, 5 mins, Asystole > VF > ST; ARDS, septic/cardiogenic shock >> IABP; start paralytic 3/16  Remains on paralytic, pressors, inotropes. 3/18  IABP to be removed 3/18  Nimbex discontinued  3/24  Bedside trach    DISCUSSION: 50 y/o reportedly homeless male, smoker, with PMH of ETOH abuse admitted 3/14 for worsening upper respiratory symptoms.  Developed progressive confusion, lactic acidosis, AKI. 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 Suffered cardiac arrest, cardiogenic shock with acute systolic CHF, ARDS 3/15.  Trach 3/24.    ASSESSMENT / PLAN:  PULMONARY A:  Acute respiratory failure 2nd to ARDS, acute pulmonary edema Tracheostomy Status (3/24) Former Tobacco Abuse Pleural effusion on cxr 3/27 Inc trache secretions.  P:   Intermittent CXR Daily trache collar >> try to wean vent off Mobilize as able PRN albuterol  Needs diuresis   CARDIOVASCULAR A:  Septic/cardiogenic shock - resolved, in setting of streptococcal bacteremia and meningitis Acute systolic CHF Cardiac arrest 3/15 CXR with B pleural effusion  -- worse (3/27) NSTEMI P:  ICU monitoring  Cardiology following Continue amiodarone, coreg, bidil & spironolactone Lasix 20 mg IV x 1. Check mg and Phos   RENAL A:   AKI 2nd to ATN. Hypokalemia Hypernatremia Worse pleural effusion.  P:   Follow BMP Replace electrolytes as  indicated Lasix 20 mg IV x 1   GASTROINTESTINAL A:   Nutrition Shock liver - resolved P:   TF support  Protonix  HEMATOLOGIC A:   Anemia  Leukocytosis - resolved  P:  F/u CBC Heparin for DVT prophylaxis   INFECTIOUS A:   Streptococcal bacteremia and meningitis RPR positive P:  Day 14 ceftriaxone, decadron per ID  >> dcd on 3/27 Pen G 2.4 million units IM weekly  ENDOCRINE A:   Hyperglycemia Relative adrenal insufficiency >> cortisol 15.2 from 3/15 P:   SSI with CBG Q4 Start insulin coverage.   NEUROLOGIC A:   Acute metabolic encephalopathy >> clinically better.  Hx of ETOH, THC Streptococcal meningitis  P:   Thiamine, folic acid Off sedation  AUTOIMMUNE A: No prior hx of autoimmune diz P: Anca neg, ana pos, ds-dna neg results, speckled pattern noticed.   Family - no family at bedside am 3/27 Disposition: Family wants patient placed in state. For that to happen, he has to be off the ventilator. Otherwise, if he'll be on the vent, he will be placed out of state.  Critical care time with this patient was 35 minutes. Disposition: J. Alexis FrockAngelo A de Dios, MD 03/01/2016, 10:30 AM [place] Pager [phone] After 3 pm or if no answer, call [phone]

## 2016-03-01 NOTE — Progress Notes (Signed)
Inpatient Diabetes Program Recommendations  AACE/ADA: New Consensus Statement on Inpatient Glycemic Control (2015)  Target Ranges:  Prepandial:   less than 140 mg/dL      Peak postprandial:   less than 180 mg/dL (1-2 hours)      Critically ill patients:  140 - 180 mg/dL   Results for Dale [name], Dale [name] (MRN [id number]) as of 03/01/2016 09:01  Ref. Range 02/29/2016 04:47 03/01/2016 04:25  Glucose Latest Ref Range: 65-99 mg/dL 409202 (H) 811316 (H)    50 y/o reportedly homeless male, smoker, with PMH of ETOH abuse admitted 3/14 for worsening upper respiratory symptoms. Developed progressive confusion, lactic acidosis, AKI. 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 Suffered cardiac arrest, cardiogenic shock with acute systolic CHF, ARDS 3/15. Trach 3/24.    Current Insulin Orders: None    MD- Note lab glucose elevated the last two days.  Please consider placing order for Novolog Sensitive Correction Scale/ SSI (0-9 units) Q4 hours     --Will follow patient during hospitalization--  Ambrose FinlandJeannine Johnston Shandel Busic RN, MSN, CDE Diabetes Coordinator Inpatient Glycemic Control Team Team Pager: [phone] (8a-5p)

## 2016-03-01 NOTE — Progress Notes (Signed)
PASRR received on today 03/01/2016  PASRR # is [id number]  Noe GensAshley Gardner, MSW, LCSW [facility] Clinical Social Worker [phone]

## 2016-03-01 NOTE — Progress Notes (Signed)
Point of Contact has been established as Patient's wife, Mickeal SkinnerShirley Humiston [phone]). CSW engaged with Mrs. Treloar via T/C as she reports that she will not be coming to the hospital on today. CSW informed Mrs. Felter that due to Patient being placed back on the ventilator, and with no insurance, Patient's vent SNF options are very limited and placement will be out of state. Patient's wife reports that out of state placement is not an option. Patient's wife agreed to meet with CSW on tomorrow 03/02/2016 at 10 AM. CSW will request MD and RN Case Manager be present if possible to assist with facilitating conversation re: disposition and medical necessity. CSW will continue to follow for disposition.   Noe GensAshley Gardner, MSW, LCSW [facility] Clinical Social Worker [phone]

## 2016-03-02 LAB — BASIC METABOLIC PANEL
ANION GAP: 9 (ref 5–15)
BUN: 30 mg/dL — ABNORMAL HIGH (ref 6–20)
CALCIUM: 8.8 mg/dL — AB (ref 8.9–10.3)
CHLORIDE: 104 mmol/L (ref 101–111)
CO2: 29 mmol/L (ref 22–32)
Creatinine, Ser: 1.18 mg/dL (ref 0.61–1.24)
GFR calc non Af Amer: >60 mL/min (ref >60)
Glucose, Bld: 111 mg/dL — ABNORMAL HIGH (ref 65–99)
Potassium: 4.1 mmol/L (ref 3.5–5.1)
SODIUM: 142 mmol/L (ref 135–145)

## 2016-03-02 LAB — GLUCOSE, CAPILLARY
GLUCOSE-CAPILLARY: 71 mg/dL (ref 65–99)
GLUCOSE-CAPILLARY: 89 mg/dL (ref 65–99)
GLUCOSE-CAPILLARY: 89 mg/dL (ref 65–99)
GLUCOSE-CAPILLARY: 98 mg/dL (ref 65–99)
Glucose-Capillary: 97 mg/dL (ref 65–99)
Glucose-Capillary: 93 mg/dL (ref 65–99)

## 2016-03-02 LAB — CARBOXYHEMOGLOBIN
CARBOXYHEMOGLOBIN: 0.9 % (ref 0.5–1.5)
Methemoglobin: 0.7 % (ref 0.0–1.5)
O2 Saturation: 83.8 %
Total hemoglobin: 8.4 g/dL — ABNORMAL LOW (ref 13.5–18.0)

## 2016-03-02 LAB — PHOSPHORUS: PHOSPHORUS: 5.4 mg/dL — AB (ref 2.5–4.6)

## 2016-03-02 LAB — MAGNESIUM: MAGNESIUM: 2.6 mg/dL — AB (ref 1.7–2.4)

## 2016-03-02 MED ORDER — ISOSORB DINITRATE-HYDRALAZINE 20-37.5 MG PO TABS
2.0000 | ORAL_TABLET | Freq: Three times a day (TID) | ORAL | Status: DC
  Administered 2016-03-02 – 2016-03-05 (×6): 2 via ORAL
  Filled 2016-03-02 (×7): qty 2

## 2016-03-02 MED ORDER — SPIRONOLACTONE 25 MG PO TABS
25.0000 mg | ORAL_TABLET | Freq: Every day | ORAL | Status: DC
  Administered 2016-03-03: 25 mg
  Filled 2016-03-02 (×2): qty 1

## 2016-03-02 MED ORDER — FUROSEMIDE 10 MG/ML IJ SOLN
20.0000 mg | Freq: Two times a day (BID) | INTRAMUSCULAR | Status: AC
  Administered 2016-03-02 – 2016-03-03 (×3): 20 mg via INTRAVENOUS
  Filled 2016-03-02 (×4): qty 2

## 2016-03-02 MED ORDER — CARVEDILOL 6.25 MG PO TABS
6.2500 mg | ORAL_TABLET | Freq: Two times a day (BID) | ORAL | Status: DC
  Administered 2016-03-02 – 2016-03-10 (×14): 6.25 mg via ORAL
  Filled 2016-03-02 (×16): qty 1

## 2016-03-02 NOTE — Progress Notes (Signed)
Nutrition Follow-up  INTERVENTION:   Continue Vital AF 1.2 @ 65 ml/hr Provides: 1872 kcal (105% of needs), 117 grams, and 1265 ml H2O.   Recommend adding free water: 200 ml every 6 hours  NUTRITION DIAGNOSIS:   Inadequate oral intake related to inability to eat as evidenced by NPO status. Ongoing.   GOAL:   Patient will meet greater than or equal to 90% of their needs Met.   MONITOR:   TF tolerance, Skin, Labs, Weight trends  ASSESSMENT:   50 yo homeless male smoker with hx of ETOH developed upper respiratory symptoms. Developed progressive confusion, lactic acidosis, AKI. 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 Suffered cardiac arrest, cardiogenic shock with acute systolic CHF, ARDS 0/60.  3/24 trach 3/29 trach collar  Labs reviewed: phosphorus elevated 5.4, magnesium elevated 2.4 NG tube tip in distal stomach Discussed with RN at bedside.  Medications reviewed and include: folate, thiamine, D5 @ 50  Diet Order:  Diet NPO time specified  Skin:  Reviewed, no issues  Last BM:  3/29 rectal pouch, volume not documented per RN volume has slowed  Height:   Ht Readings from Last 1 Encounters:  02/16/16 5' 8.9" (1.75 m)    Weight:   Wt Readings from Last 1 Encounters:  03/02/16 147 lb 4.3 oz (66.8 kg)    Ideal Body Weight:  72.7 kg  BMI:  Body mass index is 21.81 kg/(m^2).  Estimated Nutritional Needs:   Kcal:  1850-2100  Protein:  100-115 grams  Fluid:  >1.9 L/day  EDUCATION NEEDS:   No education needs identified at this time  [place], [place], [place] Pager [phone] After Hours Pager

## 2016-03-02 NOTE — Progress Notes (Addendum)
CSW engaged with Patient's wife alongside RN Case Manager in [facility] Waiting Room. CSW discussed with [name] Vent SNF vs. [facility] SNF options. Patient is currently on trach collar at 60% but is requiring vent support when necessary for distress and desaturation. CSW provided Patient's wife with a list of VENT/Trach SNF's and emphasized that with Medicaid pending, VENT SNF Placement would likely be out of state. [name] agreeable to out of state placement but remains hopeful that Patient will not return to vent. CSW provided Patient's wife with [place] Medicaid application along with Appendix D application for Long Term Care. [name] agreed to complete [facility] [place]. [name] appreciative of CSW and Case Manager's support. CSW will refer Patient to VENT/Trach SNF's. CSW will continue to follow.   Noe GensAshley Gardner, MSW, LCSW [facility] Clinical Social Worker [phone]

## 2016-03-02 NOTE — Progress Notes (Signed)
PULMONARY / CRITICAL CARE MEDICINE   Name: Dale [name] MRN: [id number] DOB: 09/16/1966    ADMISSION DATE:  02/16/2016   CONSULTATION DATE:  02/16/16  REFERRING MD:  Rito EhrlichKrishnan  CHIEF COMPLAINT:  AMS  SUBJECTIVE:  Patient requiring vent support when necessary for distress, desaturation. Copious trach secretions.  Pt only weaned x 2 hrs on 3/28 and was on the vent for the most part. On TC now. Less trache secretions.   VITAL SIGNS: BP 112/82 mmHg  Pulse 80  Temp(Src) 97 F (36.1 C) (Axillary)  Resp 26  Ht 5' 8.9" (1.75 m)  Wt 147 lb 4.3 oz (66.8 kg)  BMI 21.81 kg/m2  SpO2 98%  HEMODYNAMICS: CVP:  [5 mmHg-16 mmHg] 5 mmHg  VENTILATOR SETTINGS: Vent Mode:  [-] PRVC FiO2 (%):  [40 %-60 %] 60 % Set Rate:  [16 bmp] 16 bmp Vt Set:  [410 mL] 410 mL PEEP:  [5 cmH20] 5 cmH20 Plateau Pressure:  [11 cmH20-14 cmH20] 11 cmH20  INTAKE / OUTPUT: I/O last 3 completed shifts: In: 3440 [I.V.:850; NG/GT:2590] Out: 4010 [Urine:4010]  PHYSICAL EXAMINATION: General: chronically ill appearing male, NAD on TC Neuro:  Awake, follows commands  HEENT: [place]/AT, PERRL, #6 trach midline, thick  Bloody purulent secretions at site>> less today Cardiac: RRR, Nl S1/S2, -M/R/G. Chest: resps even non-labored,  rhonchi BLF.  Abd: Soft, NT, ND and +BS. Ext: Intact, warm/dry   LABS:  PULMONARY  Recent Labs Lab 02/25/16 0426  02/27/16 0434 02/28/16 0439 02/29/16 0445 03/01/16 0430 03/02/16 0325  PHART 7.479*  --   --   --   --   --   --   PCO2ART 41.4  --   --   --   --   --   --   PO2ART 123*  --   --   --   --   --   --   HCO3 30.4*  --   --   --   --   --   --   TCO2 31.7  --   --   --   --   --   --   O2SAT 98.2  < > 75.9 78.6 80.9 68.3 83.8  < > = values in this interval not displayed.  CBC  Recent Labs Lab 02/27/16 0435 02/28/16 0440 02/29/16 0447  HGB 8.9* 8.3* 8.3*  HCT 27.9* 26.2* 25.5*  WBC 8.8 9.3 8.9  PLT 251 262 294   COAGULATION  Recent Labs Lab 02/25/16 1403   INR 1.03   CARDIAC No results for input(s): TROPONINI in the last 168 hours. No results for input(s): PROBNP in the last 168 hours.  CHEMISTRY  Recent Labs Lab 02/25/16 0440  02/27/16 0435 02/28/16 0440 02/29/16 0447 02/29/16 1045 03/01/16 0425 03/01/16 1147 03/02/16 0320  NA 150*  < > 142 140 136  --  133*  --  142  K 4.3  < > 3.8 3.8 3.8  --  3.9  --  4.1  CL 108  < > 104 102 99*  --  100*  --  104  CO2 30  < > 30 28 27   --  26  --  29  GLUCOSE 96  < > 113* 119* 202*  --  316*  --  111*  BUN 35*  < > 34* 27* 25*  --  26*  --  30*  CREATININE 1.67*  < > 1.82* 1.59* 1.46*  --  1.42*  --  1.18  CALCIUM 7.9*  < > 7.9* 8.1* 8.1*  --  8.1*  --  8.8*  MG 2.3  --  2.2  --   --  1.5*  --  2.7* 2.6*  PHOS 4.5  --  4.8*  --   --  3.3  --  5.3* 5.4*  < > = values in this interval not displayed. Estimated Creatinine Clearance: 70 mL/min (by C-G formula based on Cr of 1.18).  LIVER  Recent Labs Lab 02/25/16 1403 02/29/16 0447  AST  --  25  ALT  --  38  ALKPHOS  --  46  BILITOT  --  0.1*  PROT  --  5.1*  ALBUMIN  --  1.7*  INR 1.03  --    INFECTIOUS No results for input(s): LATICACIDVEN, PROCALCITON in the last 168 hours.   ENDOCRINE CBG (last 3)   Recent Labs  03/02/16 0015 03/02/16 0321 03/02/16 0737  GLUCAP 71 98 97   IMAGING x48h No results found. STUDIES:  3/14 CT head > multifocal sinusitis, enlarged ventricles 3/14 LP >> glucose < 20, protein > 600, WBC 340 (88%N), 89 RBC  CULTURES: 3/14 Blood >> strep pneumo 3/14 Urine >> neg 3/14 CSF >> strep pneumo  ANTIBIOTICS: 3/14 Ampicillin >> 3/14 3/14 Vancomycin >> off 3/14 Rocephin >> off Pen G weekly >>  LINES/TUBES: 3/14 ETT >> 3/24 3/15 Rt femoral CVL >> 3/24 3/15 Rt femoral aline >> out 3/15 Rt IJ introducer >> out  3/24 RUE PICC >>   SIGNIFICANT EVENTS: 3/14  Admit, VDRF, ID/cardiology/neurology consulted 3/15  Cardiac arrest, 5 mins, Asystole > VF > ST; ARDS, septic/cardiogenic shock >>  IABP; start paralytic 3/16  Remains on paralytic, pressors, inotropes. 3/18  IABP to be removed 3/18  Nimbex discontinued  3/24  Bedside trach    DISCUSSION: 50 y/o reportedly homeless male, smoker, with PMH of ETOH abuse admitted 3/14 for worsening upper respiratory symptoms.  Developed progressive confusion, lactic acidosis, AKI. 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 Suffered cardiac arrest, cardiogenic shock with acute systolic CHF, ARDS 3/15.  Trach 3/24.    ASSESSMENT / PLAN:  PULMONARY A:  Acute respiratory failure 2nd to ARDS, acute pulmonary edema Tracheostomy Status (3/24) Former Tobacco Abuse Pleural effusion on cxr 3/27 Inc trache secretions.  P:   Intermittent CXR Daily trache collar >> try to wean vent off Mobilize as able PRN albuterol  Needs diuresis --BP borderline low N >> gentle diuresis   CARDIOVASCULAR A:  Septic/cardiogenic shock - resolved, in setting of streptococcal bacteremia and meningitis Acute systolic CHF  EF 16-10% Cardiac arrest 3/15 CXR with B pleural effusion -- worse (3/27) NSTEMI P:  ICU monitoring  Cardiology following Continue amiodarone, coreg, bidil & spironolactone Scheduled lasix. Still with pleural effusion and is (+).    RENAL A:   AKI 2nd to ATN. better Hypokalemia Hypernatremia Worse pleural effusion.  P:   Follow BMP Replace electrolytes as indicated Scheduled lasix.    GASTROINTESTINAL A:   Nutrition Shock liver - resolved P:   TF support  Protonix  HEMATOLOGIC A:   Anemia  Leukocytosis - resolved  P:  F/u CBC Heparin for DVT prophylaxis   INFECTIOUS A:   Streptococcal bacteremia and meningitis RPR positive P:  Day 14 ceftriaxone, decadron per ID  >> dcd on 3/27 Pen G 2.4 million units IM weekly  ENDOCRINE A:   Hyperglycemia Relative adrenal insufficiency >> cortisol 15.2 from 3/15 P:   SSI with CBG Q4 Start insulin coverage.  NEUROLOGIC A:   Acute metabolic encephalopathy >>  clinically better.  Hx of ETOH, THC Streptococcal meningitis  P:   Thiamine, folic acid Off sedation  AUTOIMMUNE A: No prior hx of autoimmune diz P: Anca neg, ana pos, ds-dna neg results, speckled pattern noticed.   Family - no family at bedside am 3/27 Disposition: Family wants patient placed in state. For that to happen, he has to be off the ventilator. Otherwise, if he'll be on the vent, he will be placed out of state.  Critical care time with this patient was 32 minutes.  Pollie Meyer, MD 03/02/2016, 9:53 AM [place] [facility] Pager [phone] After 3 pm or if no answer, call [phone]

## 2016-03-03 ENCOUNTER — Inpatient Hospital Stay (HOSPITAL_COMMUNITY): Payer: Medicaid Other

## 2016-03-03 DIAGNOSIS — J9621 Acute and chronic respiratory failure with hypoxia: Secondary | ICD-10-CM | POA: Diagnosis present

## 2016-03-03 DIAGNOSIS — J9690 Respiratory failure, unspecified, unspecified whether with hypoxia or hypercapnia: Secondary | ICD-10-CM

## 2016-03-03 HISTORY — DX: Respiratory failure, unspecified, unspecified whether with hypoxia or hypercapnia: J96.90

## 2016-03-03 LAB — GLUCOSE, CAPILLARY
GLUCOSE-CAPILLARY: 89 mg/dL (ref 65–99)
GLUCOSE-CAPILLARY: 92 mg/dL (ref 65–99)
Glucose-Capillary: 103 mg/dL — ABNORMAL HIGH (ref 65–99)
Glucose-Capillary: 94 mg/dL (ref 65–99)
Glucose-Capillary: 87 mg/dL (ref 65–99)
Glucose-Capillary: 99 mg/dL (ref 65–99)

## 2016-03-03 LAB — PHOSPHORUS: PHOSPHORUS: 6.5 mg/dL — AB (ref 2.5–4.6)

## 2016-03-03 LAB — BASIC METABOLIC PANEL
ANION GAP: 12 (ref 5–15)
BUN: 38 mg/dL — ABNORMAL HIGH (ref 6–20)
CALCIUM: 9.1 mg/dL (ref 8.9–10.3)
CO2: 31 mmol/L (ref 22–32)
CREATININE: 1.5 mg/dL — AB (ref 0.61–1.24)
Chloride: 105 mmol/L (ref 101–111)
GFR, EST NON AFRICAN AMERICAN: 52 mL/min — AB (ref >60)
Glucose, Bld: 107 mg/dL — ABNORMAL HIGH (ref 65–99)
Potassium: 4.2 mmol/L (ref 3.5–5.1)
SODIUM: 148 mmol/L — AB (ref 135–145)

## 2016-03-03 LAB — CARBOXYHEMOGLOBIN
CARBOXYHEMOGLOBIN: 0.8 % (ref 0.5–1.5)
Methemoglobin: 1 % (ref 0.0–1.5)
O2 SAT: 74.6 %
Total hemoglobin: 10.2 g/dL — ABNORMAL LOW (ref 13.5–18.0)

## 2016-03-03 LAB — MAGNESIUM: MAGNESIUM: 2.2 mg/dL (ref 1.7–2.4)

## 2016-03-03 NOTE — Progress Notes (Signed)
PULMONARY / CRITICAL CARE MEDICINE   Name: Dale [name] MRN: [id number] DOB: 12/02/1966    ADMISSION DATE:  02/16/2016   CONSULTATION DATE:  02/16/16  REFERRING MD:  Rito EhrlichKrishnan  CHIEF COMPLAINT:  AMS  SUBJECTIVE:  Off vent almost 24 hrs. On TC trials >> still with purulent secretions but less. On TF. Not awake enough to do swallow evaln.   VITAL SIGNS: BP 118/84 mmHg  Pulse 97  Temp(Src) 99.8 F (37.7 C) (Oral)  Resp 30  Ht 5' 8.9" (1.75 m)  Wt 132 lb 4.4 oz (60 kg)  BMI 19.59 kg/m2  SpO2 100%  HEMODYNAMICS: CVP:  [1 mmHg-7 mmHg] 1 mmHg  VENTILATOR SETTINGS: Vent Mode:  [-]  FiO2 (%):  [40 %-60 %] 40 %  INTAKE / OUTPUT: I/O last 3 completed shifts: In: 2275 [NG/GT:2275] Out: 7580 [Urine:7580]  PHYSICAL EXAMINATION: General: chronically ill appearing male, NAD on TC Neuro:  Awake, follows simple commands.  HEENT: [place]/AT, PERRL, #6 trach midline, thick  Purulent secretions per trache Cardiac: RRR, Nl S1/S2, -M/R/G. Chest: resps even non-labored,  rhonchi BLF.  Abd: Soft, NT, ND and +BS. Ext: Intact, warm/dry   LABS:  PULMONARY  Recent Labs Lab 02/28/16 0439 02/29/16 0445 03/01/16 0430 03/02/16 0325 03/03/16 0430  O2SAT 78.6 80.9 68.3 83.8 74.6    CBC  Recent Labs Lab 02/27/16 0435 02/28/16 0440 02/29/16 0447  HGB 8.9* 8.3* 8.3*  HCT 27.9* 26.2* 25.5*  WBC 8.8 9.3 8.9  PLT 251 262 294   COAGULATION  Recent Labs Lab 02/25/16 1403  INR 1.03   CARDIAC No results for input(s): TROPONINI in the last 168 hours. No results for input(s): PROBNP in the last 168 hours.  CHEMISTRY  Recent Labs Lab 02/27/16 0435 02/28/16 0440 02/29/16 0447 02/29/16 1045 03/01/16 0425 03/01/16 1147 03/02/16 0320 03/03/16 0448  NA 142 140 136  --  133*  --  142 148*  K 3.8 3.8 3.8  --  3.9  --  4.1 4.2  CL 104 102 99*  --  100*  --  104 105  CO2 30 28 27   --  26  --  29 31  GLUCOSE 113* 119* 202*  --  316*  --  111* 107*  BUN 34* 27* 25*  --  26*  --   30* 38*  CREATININE 1.82* 1.59* 1.46*  --  1.42*  --  1.18 1.50*  CALCIUM 7.9* 8.1* 8.1*  --  8.1*  --  8.8* 9.1  MG 2.2  --   --  1.5*  --  2.7* 2.6* 2.2  PHOS 4.8*  --   --  3.3  --  5.3* 5.4* 6.5*   Estimated Creatinine Clearance: 49.4 mL/min (by C-G formula based on Cr of 1.5).  LIVER  Recent Labs Lab 02/25/16 1403 02/29/16 0447  AST  --  25  ALT  --  38  ALKPHOS  --  46  BILITOT  --  0.1*  PROT  --  5.1*  ALBUMIN  --  1.7*  INR 1.03  --    INFECTIOUS No results for input(s): LATICACIDVEN, PROCALCITON in the last 168 hours.   ENDOCRINE CBG (last 3)   Recent Labs  03/02/16 2323 03/03/16 0419 03/03/16 0841  GLUCAP 89 103* 92   IMAGING x48h Dg Chest Port 1 View  03/03/2016  CLINICAL DATA:  Shortness of breath. EXAM: PORTABLE CHEST 1 VIEW COMPARISON:  02/29/2016. FINDINGS: Tracheostomy tube, NG tube, right PICC line in stable position.  Mediastinum hilar structures normal. Heart size stable. Diffuse bilateral airspace disease. Basilar atelectasis. Small bilateral pleural effusions. IMPRESSION: 1. Lines and tubes in stable position. 2. Bilateral airspace disease consistent with bilateral pulmonary infiltrates/edema. Low lung volumes with basilar atelectasis. Small bilateral pleural effusions. Similar findings noted on prior exam. Electronically Signed   By: Maisie Fus  Register   On: 03/03/2016 07:14   STUDIES:  3/14 CT head > multifocal sinusitis, enlarged ventricles 3/14 LP >> glucose < 20, protein > 600, WBC 340 (88%N), 89 RBC  CULTURES: 3/14 Blood >> strep pneumo 3/14 Urine >> neg 3/14 CSF >> strep pneumo  ANTIBIOTICS: 3/14 Ampicillin >> 3/14 3/14 Vancomycin >> off 3/14 Rocephin >> off Pen G weekly >>  LINES/TUBES: 3/14 ETT >> 3/24 3/15 Rt femoral CVL >> 3/24 3/15 Rt femoral aline >> out 3/15 Rt IJ introducer >> out  3/24 RUE PICC >>   SIGNIFICANT EVENTS: 3/14  Admit, VDRF, ID/cardiology/neurology consulted 3/15  Cardiac arrest, 5 mins, Asystole > VF > ST;  ARDS, septic/cardiogenic shock >> IABP; start paralytic 3/16  Remains on paralytic, pressors, inotropes. 3/18  IABP to be removed 3/18  Nimbex discontinued  3/24  Bedside trach    DISCUSSION: 50 y/o reportedly homeless male, smoker, with PMH of ETOH abuse admitted 3/14 for worsening upper respiratory symptoms.  Developed progressive confusion, lactic acidosis, AKI. 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 Suffered cardiac arrest, cardiogenic shock with acute systolic CHF, ARDS 3/15.  Trach 3/24.    ASSESSMENT / PLAN:  PULMONARY A:  S/P Acute respiratory failure 2nd to ARDS, acute pulmonary edema S/P Tracheostomy - 3/24 Former Tobacco Abuse Pleural effusion on cxr 3/27 P:   Daily trache collar >> off vent 24 hrs now Mobilize as able PRN albuterol  diurescing well but with elevated creat >> will d/c lasix after today and do prn lasix    CARDIOVASCULAR A:  S/P Septic/cardiogenic shock - resolved, in setting of streptococcal bacteremia and meningitis Acute systolic CHF  EF 40-98% Cardiac arrest 3/15 CXR with B pleural effusion -- worse (3/27) NSTEMI P:  Cardiology following Continue amiodarone, coreg, bidil & spironolactone diurescing well but creat is rising. Will d/c lasix after todays dose.    RENAL A:   AKI 2nd to ATN. better Hypokalemia Hypernatremia Worse pleural effusion.  P:   Follow BMP Replace electrolytes as indicated Scheduled lasix >> will make prn   GASTROINTESTINAL A:   Nutrition Shock liver - resolved P:   TF support  Protonix Not awake enough for swallow evaln  HEMATOLOGIC A:   Anemia  Leukocytosis - resolved  P:  F/u CBC Heparin for DVT prophylaxis   INFECTIOUS A:   Streptococcal bacteremia and meningitis RPR positive P:  Day 14 ceftriaxone, decadron per ID  >> dcd on 3/27 Pen G 2.4 million units IM weekly No acitive infxn for now  ENDOCRINE A:   Hyperglycemia Relative adrenal insufficiency >> cortisol 15.2 from 3/15 P:   SSI  with CBG Q4 Cont insulin coverage.  Steroids weaned off  NEUROLOGIC A:   Acute metabolic encephalopathy >> clinically better.  Hx of ETOH, THC Streptococcal meningitis  P:   Thiamine, folic acid Off sedation  AUTOIMMUNE A: No prior hx of autoimmune diz P: Anca neg, ana pos, ds-dna neg results, speckled pattern noticed.   Family - no family at bedside am 3/30 Disposition: Family wants patient placed in state. For that to happen, he has to be off the ventilator. Otherwise, if he'll be on the vent, he  will be placed out of state. Plan to transfer to SDU. He needs placement but awating medicaid. Case management involved.  Case discussed with Dr. Carolyne Littles. TH to pick up in am. PCCM will consult.    Pollie Meyer, MD 03/03/2016, 12:41 PM [place] Pager [phone] After 3 pm or if no answer, call [phone]

## 2016-03-03 NOTE — Progress Notes (Signed)
Trach care done, pt site was cleansed with sterile water and NS. Dried and dressing applied. New iC inserted and locked. Pt was suctioned got back moderate amount of pink tinged blood and scant tan smear secretions in the lumen catheter. Pt was stable throughout suctioning procedure. RN aware

## 2016-03-03 NOTE — Progress Notes (Signed)
eLink Physician-Brief Progress Note Patient Name: Dale [name] DOB: 10/10/1966 MRN: [id number]   Date of Service  03/03/2016  HPI/Events of Note  CVP = 2. Patient diureses 2 to 3 liters post Lasix.  eICU Interventions  Hold next Lasix dose.      Intervention Category Intermediate Interventions: Other:  [name] Dennard Nipugene 03/03/2016, 8:10 PM

## 2016-03-03 NOTE — Progress Notes (Signed)
    [facility]   Reason for visit: Follow up on Strep pneumononia meningitis  Interval History: trach   Physical Exam: Constitutional:  Filed Vitals:   03/03/16 0600 03/03/16 0926  BP: 115/83 104/71  Pulse: 98 101  Temp:    Resp: 33 26   chronically ill appearing Cardiovascular: RRR GI: soft, nt, nd  Review of Systems: Constitutional: no fever  Lab Results  Component Value Date   WBC 8.9 02/29/2016   HGB 8.3* 02/29/2016   HCT 25.5* 02/29/2016   MCV 91.1 02/29/2016   PLT 294 02/29/2016    Lab Results  Component Value Date   CREATININE 1.50* 03/03/2016   BUN 38* 03/03/2016   NA 148* 03/03/2016   K 4.2 03/03/2016   CL 105 03/03/2016   CO2 31 03/03/2016    Lab Results  Component Value Date   ALT 38 02/29/2016   AST 25 02/29/2016   ALKPHOS 46 02/29/2016     Microbiology: Recent Results (from the past 240 hour(s))  C difficile quick scan w PCR reflex     Status: None   Collection Time: 02/22/16 11:09 AM  Result Value Ref Range Status   C Diff antigen NEGATIVE NEGATIVE Final   C Diff toxin NEGATIVE NEGATIVE Final   C Diff interpretation Negative for toxigenic C. difficile  Final    Impression/Plan:  1. Strep pneumonia meningitis - completed treatment.    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  #2 given 3/27.   Will sign off, thanks

## 2016-03-03 NOTE — Progress Notes (Signed)
03/03/2016- Respiratory care note- Pt has a #6 cuffed shiley trach in place.  Pt now weaning on aerosol trach collar.  Trach care supplies at bedside. Pt with a moderate amt of tan secretions. No issues noted at time of eval. Will cont to monitor progress.

## 2016-03-04 DIAGNOSIS — Z430 Encounter for attention to tracheostomy: Secondary | ICD-10-CM

## 2016-03-04 LAB — BASIC METABOLIC PANEL
ANION GAP: 13 (ref 5–15)
BUN: 44 mg/dL — AB (ref 6–20)
CALCIUM: 8.2 mg/dL — AB (ref 8.9–10.3)
CO2: 26 mmol/L (ref 22–32)
CREATININE: 1.81 mg/dL — AB (ref 0.61–1.24)
Chloride: 106 mmol/L (ref 101–111)
GFR calc Af Amer: 48 mL/min — ABNORMAL LOW (ref >60)
GFR, EST NON AFRICAN AMERICAN: 42 mL/min — AB (ref >60)
GLUCOSE: 214 mg/dL — AB (ref 65–99)
Potassium: 4 mmol/L (ref 3.5–5.1)
Sodium: 145 mmol/L (ref 135–145)

## 2016-03-04 LAB — CARBOXYHEMOGLOBIN
CARBOXYHEMOGLOBIN: 0.9 % (ref 0.5–1.5)
METHEMOGLOBIN: 0.8 % (ref 0.0–1.5)
O2 SAT: 77.8 %
TOTAL HEMOGLOBIN: 10.2 g/dL — AB (ref 13.5–18.0)

## 2016-03-04 LAB — CBC
HEMATOCRIT: 30 % — AB (ref 39.0–52.0)
Hemoglobin: 9.9 g/dL — ABNORMAL LOW (ref 13.0–17.0)
MCH: 30.6 pg (ref 26.0–34.0)
MCHC: 33 g/dL (ref 30.0–36.0)
MCV: 92.6 fL (ref 78.0–100.0)
PLATELETS: 406 10*3/uL — AB (ref 150–400)
RBC: 3.24 MIL/uL — ABNORMAL LOW (ref 4.22–5.81)
RDW: 14.8 % (ref 11.5–15.5)
WBC: 8.9 10*3/uL (ref 4.0–10.5)

## 2016-03-04 LAB — GLUCOSE, CAPILLARY
GLUCOSE-CAPILLARY: 101 mg/dL — AB (ref 65–99)
GLUCOSE-CAPILLARY: 102 mg/dL — AB (ref 65–99)
GLUCOSE-CAPILLARY: 109 mg/dL — AB (ref 65–99)
GLUCOSE-CAPILLARY: 109 mg/dL — AB (ref 65–99)
GLUCOSE-CAPILLARY: 112 mg/dL — AB (ref 65–99)
GLUCOSE-CAPILLARY: 99 mg/dL (ref 65–99)
Glucose-Capillary: 118 mg/dL — ABNORMAL HIGH (ref 65–99)

## 2016-03-04 LAB — MAGNESIUM: Magnesium: 2.2 mg/dL (ref 1.7–2.4)

## 2016-03-04 LAB — PHOSPHORUS: Phosphorus: 6 mg/dL — ABNORMAL HIGH (ref 2.5–4.6)

## 2016-03-04 NOTE — Progress Notes (Signed)
PULMONARY / CRITICAL CARE MEDICINE   Name: Dale [name] MRN: [id number] DOB: February 01, 1966    ADMISSION DATE:  02/16/2016   CONSULTATION DATE:  02/16/16  REFERRING MD:  Rito Ehrlich  CHIEF COMPLAINT:  AMS  SUBJECTIVE:   Off vent close to 2 days now. Still with increased secretions. Not awake enough to follow commands and eat.   VITAL SIGNS: BP 118/90 mmHg  Pulse 99  Temp(Src) 100.7 F (38.2 C) (Oral)  Resp 40  Ht 5' 8.9" (1.75 m)  Wt 130 lb 1.1 oz (59 kg)  BMI 19.27 kg/m2  SpO2 100%  HEMODYNAMICS: CVP:  [1 mmHg-29 mmHg] 12 mmHg  VENTILATOR SETTINGS: Vent Mode:  [-]  FiO2 (%):  [40 %] 40 %  INTAKE / OUTPUT: I/O last 3 completed shifts: In: 2300 [I.V.:350; NG/GT:1950] Out: 5795 [Urine:5795]  PHYSICAL EXAMINATION: General: chronically ill appearing male, NAD on TC Neuro:  Awake, does not follow commands HEENT: [place]/AT, PERRL, #6 trach midline, thick  Purulent secretions per trache > less secretions Cardiac: RRR, Nl S1/S2, -M/R/G. Chest: resps even non-labored,  rhonchi BLF.  Abd: Soft, NT, ND and +BS. Ext: Intact, warm/dry   LABS:  PULMONARY  Recent Labs Lab 02/29/16 0445 03/01/16 0430 03/02/16 0325 03/03/16 0430 03/04/16 0438  O2SAT 80.9 68.3 83.8 74.6 77.8    CBC  Recent Labs Lab 02/28/16 0440 02/29/16 0447 03/04/16 0731  HGB 8.3* 8.3* 9.9*  HCT 26.2* 25.5* 30.0*  WBC 9.3 8.9 8.9  PLT 262 294 406*   COAGULATION No results for input(s): INR in the last 168 hours. CARDIAC No results for input(s): TROPONINI in the last 168 hours. No results for input(s): PROBNP in the last 168 hours.  CHEMISTRY  Recent Labs Lab 02/29/16 0447 02/29/16 1045 03/01/16 0425 03/01/16 1147 03/02/16 0320 03/03/16 0448 03/04/16 0430  NA 136  --  133*  --  142 148* 145  K 3.8  --  3.9  --  4.1 4.2 4.0  CL 99*  --  100*  --  104 105 106  CO2 27  --  26  --  GLUCOSE 202*  --  316*  --  111* 107* 214*  BUN 25*  --  26*  --  30* 38* 44*  CREATININE 1.46*   --  1.42*  --  1.18 1.50* 1.81*  CALCIUM 8.1*  --  8.1*  --  8.8* 9.1 8.2*  MG  --  1.5*  --  2.7* 2.6* 2.2 2.2  PHOS  --  3.3  --  5.3* 5.4* 6.5* 6.0*   Estimated Creatinine Clearance: 40.3 mL/min (by C-G formula based on Cr of 1.81).  LIVER  Recent Labs Lab 02/29/16 0447  AST 25  ALT 38  ALKPHOS 46  BILITOT 0.1*  PROT 5.1*  ALBUMIN 1.7*   INFECTIOUS No results for input(s): LATICACIDVEN, PROCALCITON in the last 168 hours.   ENDOCRINE CBG (last 3)   Recent Labs  03/04/16 0036 03/04/16 0417 03/04/16 0748  GLUCAP 109* 99 101*   IMAGING x48h Dg Chest Port 1 View  03/03/2016  CLINICAL DATA:  Shortness of breath. EXAM: PORTABLE CHEST 1 VIEW COMPARISON:  02/29/2016. FINDINGS: Tracheostomy tube, NG tube, right PICC line in stable position. Mediastinum hilar structures normal. Heart size stable. Diffuse bilateral airspace disease. Basilar atelectasis. Small bilateral pleural effusions. IMPRESSION: 1. Lines and tubes in stable position. 2. Bilateral airspace disease consistent with bilateral pulmonary infiltrates/edema. Low lung volumes with basilar atelectasis. Small bilateral pleural effusions.  Similar findings noted on prior exam. Electronically Signed   By: Maisie Fushomas  Register   On: 03/03/2016 07:14   STUDIES:  3/14 CT head > multifocal sinusitis, enlarged ventricles 3/14 LP >> glucose < 20, protein > 600, WBC 340 (88%N), 89 RBC  CULTURES: 3/14 Blood >> strep pneumo 3/14 Urine >> neg 3/14 CSF >> strep pneumo  ANTIBIOTICS: 3/14 Ampicillin >> 3/14 3/14 Vancomycin >> off 3/14 Rocephin >> off Pen G weekly >>  LINES/TUBES: 3/14 ETT >> 3/24 3/15 Rt femoral CVL >> 3/24 3/15 Rt femoral aline >> out 3/15 Rt IJ introducer >> out  3/24 RUE PICC >>   SIGNIFICANT EVENTS: 3/14  Admit, VDRF, ID/cardiology/neurology consulted 3/15  Cardiac arrest, 5 mins, Asystole > VF > ST; ARDS, septic/cardiogenic shock >> IABP; start paralytic 3/16  Remains on paralytic, pressors,  inotropes. 3/18  IABP to be removed 3/18  Nimbex discontinued  3/24  Bedside trach    DISCUSSION: 50 y/o reportedly homeless male, smoker, with PMH of ETOH abuse admitted 3/14 for worsening upper respiratory symptoms.  Developed progressive confusion, lactic acidosis, AKI. 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 Suffered cardiac arrest, cardiogenic shock with acute systolic CHF, ARDS 3/15.  Trach 3/24.  Still with increased trache secretiosn.   ASSESSMENT / PLAN:  PULMONARY A:  S/P Acute respiratory failure 2nd to ARDS, acute pulmonary edema S/P Tracheostomy - 3/24 Former Tobacco Abuse Pleural effusion on cxr 3/27 P:   TC as tolerated. Still with inc trache secretions > once with less secretions, plan to do PMV trials.  Mobilize as able PRN albuterol  S/p lasix.     CARDIOVASCULAR A:  S/P Septic/cardiogenic shock - resolved, in setting of streptococcal bacteremia and meningitis Acute systolic CHF  EF 82-95%50-55% Cardiac arrest 3/15 CXR with B pleural effusion -- worse (3/27) NSTEMI P:  Cardiology following Continue amiodarone, coreg, bidil & spironolactone S/p lasix   RENAL A:   AKI 2nd to ATN. better Hypokalemia Hypernatremia Worse pleural effusion.  P:   Follow BMP Replace electrolytes as indicated S/p lasix.    GASTROINTESTINAL A:   Nutrition Shock liver - resolved P:   TF support  Protonix Not awake enough for swallow evaln. Pt will need a PEG tube  HEMATOLOGIC A:   Anemia  Leukocytosis - resolved  P:  F/u CBC Heparin for DVT prophylaxis   INFECTIOUS A:   Streptococcal bacteremia and meningitis RPR positive P:  Day 14 ceftriaxone, decadron per ID  >> dcd on 3/27 Pen G 2.4 million units IM weekly No acitive infxn for now  ENDOCRINE A:   Hyperglycemia Relative adrenal insufficiency >> cortisol 15.2 from 3/15 P:   SSI with CBG Q4 Cont insulin coverage.  Steroids weaned off  NEUROLOGIC A:   Acute metabolic encephalopathy >> clinically  better.  Hx of ETOH, THC Streptococcal meningitis  P:   Thiamine, folic acid Off sedation  AUTOIMMUNE A: No prior hx of autoimmune diz P: Anca neg, ana pos, ds-dna neg results, speckled pattern noticed.   Family - no family at bedside am 3/30 Disposition: cont trache trials as tolerated. Off the vent. Pt will need a PEG tube placed. Will need placement ([facility]) once with medicaid.    Pollie MeyerJ. Angelo A de Dios, MD 03/04/2016, 12:02 PM  Pulmonary [facility] Critical Care Pager [phone] After 3 pm or if no answer, call [phone]

## 2016-03-04 NOTE — Progress Notes (Signed)
[facility] PROGRESS NOTE     Name: Dale [name] MRN: [id number] DOB: 04/15/1966    ADMISSION DATE:  02/16/2016   [facility] Transfer DATE:  03/04/16    CHIEF COMPLAINT:  AMS  SUBJECTIVE:  100.7 fever overnight . On TC trials >> still with purulent secretions but less. On TF.  Lasix held overnight  VITAL SIGNS: BP 118/84 mmHg  Pulse 103  Temp(Src) 100.1 F (37.8 C) (Axillary)  Resp 26  Ht 5' 8.9" (1.75 m)  Wt 59 kg (130 lb 1.1 oz)  BMI 19.27 kg/m2  SpO2 100%  HEMODYNAMICS: CVP:  [1 mmHg-6 mmHg] 1 mmHg  VENTILATOR SETTINGS: Vent Mode:  [-]  FiO2 (%):  [40 %] 40 %  INTAKE / OUTPUT: I/O last 3 completed shifts: In: 2300 [I.V.:350; NG/GT:1950] Out: 5795 [Urine:5795]  PHYSICAL EXAMINATION: General: chronically ill appearing male, NAD on TC Neuro:  Awake, follows simple commands.  HEENT: [place]/AT, PERRL, #6 trach midline, thick  Purulent secretions per trache Cardiac: RRR, Nl S1/S2, -M/R/G. Chest: resps even non-labored,  rhonchi BLF.  Abd: Soft, NT, ND and +BS. Ext: Intact, warm/dry   LABS:  PULMONARY  Recent Labs Lab 02/29/16 0445 03/01/16 0430 03/02/16 0325 03/03/16 0430 03/04/16 0438  O2SAT 80.9 68.3 83.8 74.6 77.8    CBC  Recent Labs Lab 02/27/16 0435 02/28/16 0440 02/29/16 0447  HGB 8.9* 8.3* 8.3*  HCT 27.9* 26.2* 25.5*  WBC 8.8 9.3 8.9  PLT 251 262 294   COAGULATION No results for input(s): INR in the last 168 hours. CARDIAC No results for input(s): TROPONINI in the last 168 hours. No results for input(s): PROBNP in the last 168 hours.  CHEMISTRY  Recent Labs Lab 02/29/16 0447 02/29/16 1045 03/01/16 0425 03/01/16 1147 03/02/16 0320 03/03/16 0448 03/04/16 0430  NA 136  --  133*  --  142 148* 145  K 3.8  --  3.9  --  4.1 4.2 4.0  CL 99*  --  100*  --  104 105 106  CO2 27  --  26  --  GLUCOSE 202*  --  316*  --  111* 107* 214*  BUN 25*  --  26*  --  30* 38* 44*  CREATININE 1.46*  --  1.42*  --  1.18 1.50* 1.81*  CALCIUM 8.1*  --   8.1*  --  8.8* 9.1 8.2*  MG  --  1.5*  --  2.7* 2.6* 2.2 2.2  PHOS  --  3.3  --  5.3* 5.4* 6.5* 6.0*   Estimated Creatinine Clearance: 40.3 mL/min (by C-G formula based on Cr of 1.81).  LIVER  Recent Labs Lab 02/29/16 0447  AST 25  ALT 38  ALKPHOS 46  BILITOT 0.1*  PROT 5.1*  ALBUMIN 1.7*   INFECTIOUS No results for input(s): LATICACIDVEN, PROCALCITON in the last 168 hours.   ENDOCRINE CBG (last 3)   Recent Labs  03/03/16 2325 03/04/16 0036 03/04/16 0417  GLUCAP 102* 109* 99   IMAGING x48h Dg Chest Port 1 View  03/03/2016  CLINICAL DATA:  Shortness of breath. EXAM: PORTABLE CHEST 1 VIEW COMPARISON:  02/29/2016. FINDINGS: Tracheostomy tube, NG tube, right PICC line in stable position. Mediastinum hilar structures normal. Heart size stable. Diffuse bilateral airspace disease. Basilar atelectasis. Small bilateral pleural effusions. IMPRESSION: 1. Lines and tubes in stable position. 2. Bilateral airspace disease consistent with bilateral pulmonary infiltrates/edema. Low lung volumes with basilar atelectasis. Small bilateral pleural effusions. Similar findings noted on prior exam.  Electronically Signed   By: Maisie Fushomas  Register   On: 03/03/2016 07:14   STUDIES:  3/14 CT head > multifocal sinusitis, enlarged ventricles 3/14 LP >> glucose < 20, protein > 600, WBC 340 (88%N), 89 RBC  CULTURES: 3/14 Blood >> strep pneumo 3/14 Urine >> neg 3/14 CSF >> strep pneumo  ANTIBIOTICS: Anti-infectives    Start     Dose/Rate Route Frequency Ordered Stop   02/22/16 0930  penicillin g benzathine (BICILLIN LA) 1200000 UNIT/2ML injection 2.4 Million Units     2.4 Million Units Intramuscular Weekly 02/22/16 0836 03/14/16 0929   02/17/16 1430  vancomycin (VANCOCIN) IVPB 750 mg/150 ml premix  Status:  Discontinued     750 mg 150 mL/hr over 60 Minutes Intravenous Every 24 hours 02/16/16 1447 02/17/16 0805   02/17/16 1200  cefTRIAXone (ROCEPHIN) 1 g in dextrose 5 % 50 mL IVPB  Status:   Discontinued     1 g 100 mL/hr over 30 Minutes Intravenous Every 24 hours 02/16/16 1143 02/16/16 1144   02/17/16 1200  cefTRIAXone (ROCEPHIN) 2 g in dextrose 5 % 50 mL IVPB  Status:  Discontinued     2 g 100 mL/hr over 30 Minutes Intravenous Every 24 hours 02/16/16 1144 02/16/16 1447   02/17/16 1200  ampicillin (OMNIPEN) 2 g in sodium chloride 0.9 % 50 mL IVPB  Status:  Discontinued     2 g 150 mL/hr over 20 Minutes Intravenous 4 times per day 02/17/16 0803 02/17/16 0812   02/17/16 0830  vancomycin (VANCOCIN) 500 mg in sodium chloride 0.9 % 100 mL IVPB  Status:  Discontinued     500 mg 100 mL/hr over 60 Minutes Intravenous Every 12 hours 02/17/16 0805 02/19/16 1407   02/17/16 0000  cefTRIAXone (ROCEPHIN) 2 g in dextrose 5 % 50 mL IVPB     2 g 100 mL/hr over 30 Minutes Intravenous Every 12 hours 02/16/16 1447 02/29/16 1338   02/16/16 1500  vancomycin (VANCOCIN) IVPB 1000 mg/200 mL premix     1,000 mg 200 mL/hr over 60 Minutes Intravenous  Once 02/16/16 1447 02/16/16 2122   02/16/16 1500  ampicillin (OMNIPEN) 2 g in sodium chloride 0.9 % 50 mL IVPB  Status:  Discontinued     2 g 150 mL/hr over 20 Minutes Intravenous 3 times per day 02/16/16 1447 02/17/16 0803   02/16/16 1145  cefTRIAXone (ROCEPHIN) 2 g in dextrose 5 % 50 mL IVPB  Status:  Discontinued     2 g 100 mL/hr over 30 Minutes Intravenous  Once 02/16/16 1130 02/16/16 1136   02/16/16 1145  cefTRIAXone (ROCEPHIN) 1 g in dextrose 5 % 50 mL IVPB  Status:  Discontinued     1 g 100 mL/hr over 30 Minutes Intravenous Every 24 hours 02/16/16 1136 02/16/16 1143   02/16/16 1145  cefTRIAXone (ROCEPHIN) 2 g in dextrose 5 % 50 mL IVPB     2 g 100 mL/hr over 30 Minutes Intravenous  Once 02/16/16 1143 02/16/16 1711       LINES/TUBES: 3/14 ETT >> 3/24 3/15 Rt femoral CVL >> 3/24 3/15 Rt femoral aline >> out 3/15 Rt IJ introducer >> out  3/24 RUE PICC >>   SIGNIFICANT EVENTS: 3/14  Admit, VDRF, ID/cardiology/neurology consulted 3/15   Cardiac arrest, 5 mins, Asystole > VF > ST; ARDS, septic/cardiogenic shock >> IABP; start paralytic 3/16  Remains on paralytic, pressors, inotropes. 3/18  IABP to be removed 3/18  Nimbex discontinued  3/24  Bedside trach  DISCUSSION: 50 y/o reportedly homeless male, smoker, with PMH of ETOH abuse admitted 3/14 for worsening upper respiratory symptoms.  Developed progressive confusion, lactic acidosis, AKI. 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 Suffered cardiac arrest, cardiogenic shock with acute systolic CHF, ARDS 3/15.  Trach 3/24.    ASSESSMENT / PLAN: S/P Acute respiratory failure 2nd to ARDS, acute pulmonary edema S/P Tracheostomy - 3/24 Former Tobacco Abuse Pleural effusion on cxr 3/27 Daily trache collar >> off vent 48 hours, continues to have purulent secretions from the trach Last chest x-ray 3/30 showed bilateral airspace disease consistent with pulmonary infiltrate/edema PRN albuterol , incentive spirometry Lasix discontinued because of worsening kidney function Repeat pro-calcitonin in the setting of fever     S/P Septic/cardiogenic shock - resolved, in setting of streptococcal bacteremia and meningitis Acute systolic CHF  EF 16-10% Cardiac arrest 3/15 CXR with B pleural effusion -- worse (3/27) NSTEMI Cardiology following Continue amiodarone, coreg, bidil & hold spironolactone [worsening renal function] Lasix on hold because of worsening renal function     AKI 2nd to ATN. Creatinine on the rise, over diuresis? Hypokalemia Hypernatremia Closely follow renal function Lasix on hold     Nutrition Shock liver - resolved  Continue tube feeds, D5 IV fluids Protonix Speech therapy consultation    Anemia  Leukocytosis - resolved  F/u CBC Heparin for DVT prophylaxis     Streptococcal bacteremia and meningitis RPR positive Day 14 ceftriaxone, decadron per ID  >> dcd on 3/27 Pen G 2.4 million units IM weekly until 4/10 No acitive infxn for  now   Hypoglycemia Relative adrenal insufficiency >> cortisol 15.2 from 3/15 SSI with CBG Q4 Cont insulin coverage.  Steroids weaned off   Acute metabolic encephalopathy >> clinically better.  Hx of ETOH, THC Streptococcal meningitis  Thiamine, folic acid Off sedation Anca neg, ana pos, ds-dna neg results, speckled pattern noticed.    Disposition: Family wants patient placed in state. For that to happen, he has to be off the ventilator. Otherwise, if he'll be on the vent, he will be placed out of state. Continue SDU. He needs placement but awating medicaid. Case management involved.     Richarda Overlie , MD 03/04/2016, 7:05 AM  Pager [phone]

## 2016-03-04 NOTE — NC FL2 (Signed)
[place] LEVEL OF CARE SCREENING TOOL     IDENTIFICATION  Patient Name: Dale [name] Birthdate: 06/17/1966 Sex: male Admission Date (Current Location): 02/16/2016  [facility] and [place] Number:  Herbalist and Address:  [facility] [place] [address] [place], [place] [address]      Provider Number: [id number]  Attending Physician Name and Address:  Reyne Dumas, MD  Relative Name and Phone Number:  Charlena Cross, daughter, [phone]    Current Level of Care: Hospital Recommended Level of Care: [place] Prior Approval Number:    Date Approved/Denied:   PASRR Number:   [id number]   Discharge Plan: SNF    Current Diagnoses: Patient Active Problem List   Diagnosis Date Noted  . Respiratory failure ([place]) 03/03/2016  . Acute respiratory failure with hypoxia ([place])   . Septic shock ([place])   . Cardiogenic shock ([place])   . Acute systolic heart failure ([place])   . Cardiac arrest ([place])   . Hydrocephalus   . Altered mental status   . CHF (congestive heart failure) ([place])   . 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 Homeless 02/16/2016  . Meningitis, streptococcal 02/16/2016  . Tobacco abuse 02/16/2016  . AKI (acute kidney injury) ([place]) 02/16/2016  . Gram positive sepsis ([place]) 02/16/2016  . Encephalopathy, metabolic 00/86/7619  . Underweight 02/16/2016  . ETOH abuse   . Substance abuse   . Acute encephalopathy   . Acute respiratory failure (HCC)     Orientation RESPIRATION BLADDER Height & Weight        Tracheostomy Continent, Indwelling catheter (Urinary catheter) Weight: 130 lb 1.1 oz (59 kg) Height:  5' 8.9" (175 cm)  BEHAVIORAL SYMPTOMS/MOOD NEUROLOGICAL BOWEL NUTRITION STATUS      Incontinent (Rectal tube) NG/panda (Please see DC summary for update)  AMBULATORY STATUS COMMUNICATION OF NEEDS Skin     Non-Verbally (Currently on trach) Bruising                       Personal Care Assistance Level of  Assistance  Bathing, Feeding, Dressing           Functional Limitations Info             SPECIAL CARE FACTORS FREQUENCY                       Contractures      Additional Factors Info  Code Status, Allergies, Insulin Sliding Scale Code Status Info: DNR Allergies Info: NKA   Insulin Sliding Scale Info: insulin aspart (novoLOG) injection 0-20 Units;       Current Medications (03/04/2016):  This is the current hospital active medication list Current Facility-Administered Medications  Medication Dose Route Frequency Provider Last Rate Last Dose  . acetaminophen (TYLENOL) solution 650 mg  650 mg Oral Q6H PRN Chesley Mires, MD   650 mg at 03/04/16 0447  . albuterol (PROVENTIL) (2.5 MG/3ML) 0.083% nebulizer solution 2.5 mg  2.5 mg Nebulization Q3H PRN Rahul P Desai, PA-C      . amiodarone (PACERONE) tablet 200 mg  200 mg Oral Daily Larey Dresser, MD   200 mg at 03/03/16 1044  . antiseptic oral rinse solution (CORINZ)  7 mL Mouth Rinse QID Jose Shirl [name], MD   7 mL at 03/04/16 0415  . aspirin chewable tablet 81 mg  81 mg Oral Daily Larey Dresser, MD   81 mg at 03/03/16  1043  . carvedilol (COREG) tablet 6.25 mg  6.25 mg Oral BID WC Jose Shirl [name], MD   6.25 mg at 03/04/16 0841  . chlorhexidine gluconate (SAGE KIT) (PERIDEX) 0.12 % solution 15 mL  15 mL Mouth Rinse BID Jose Shirl [name], MD   15 mL at 03/04/16 0843  . dextrose 5 % solution   Intravenous Continuous Donita Brooks, NP 50 mL/hr at 03/03/16 2200    . feeding supplement (VITAL AF 1.2 CAL) liquid 1,000 mL  1,000 mL Per Tube Continuous Asencion Islam, RD 65 mL/hr at 03/03/16 2026 1,000 mL at 03/03/16 2026  . fentaNYL (SUBLIMAZE) injection 25-100 mcg  25-100 mcg Intravenous Q2H PRN Donita Brooks, NP   100 mcg at 03/02/16 1331  . folic acid (FOLVITE) tablet 1 mg  1 mg Per Tube Daily Chesley Mires, MD   1 mg at 03/03/16 1043  . furosemide (LASIX) injection 20 mg  20 mg Intravenous Q12H [place], MD   20 mg at 03/03/16 1044  . heparin injection 5,000 Units  5,000 Units Subcutaneous 3 times per day Larey Dresser, MD   5,000 Units at 03/04/16 0507  . insulin aspart (novoLOG) injection 0-20 Units  0-20 Units Subcutaneous 6 times per day Rush Landmark, MD   0 Units at 03/01/16 1200  . isosorbide-hydrALAZINE (BIDIL) 20-37.5 MG per tablet 2 tablet  2 tablet Oral TID Jose Shirl [name], MD   2 tablet at 03/03/16 1413  . pantoprazole sodium (PROTONIX) 40 mg/20 mL oral suspension 40 mg  40 mg Per Tube Q24H Chesley Mires, MD   40 mg at 03/03/16 1044  . penicillin g benzathine (BICILLIN LA) 1200000 UNIT/2ML injection 2.4 Million Units  2.4 Million Units Intramuscular Weekly Thayer Headings, MD   2.4 Million Units at 02/29/16 [phone]  . propofol (DIPRIVAN) 500 MG/50ML infusion  5-80 mcg/kg/min Intravenous Once Donita Brooks, NP   5 mcg/kg/min at 02/26/16 1200  . sodium chloride flush (NS) 0.9 % injection 10-40 mL  10-40 mL Intracatheter Q12H Rush Farmer, MD   10 mL at 03/03/16 2200  . spironolactone (ALDACTONE) tablet 25 mg  25 mg Per Tube Daily [place], MD   25 mg at 03/03/16 1043  . thiamine (VITAMIN B-1) tablet 100 mg  100 mg Per Tube Daily Chesley Mires, MD   100 mg at 03/03/16 1044     Discharge Medications: Please see discharge summary for a list of discharge medications.  Relevant Imaging Results:  Relevant Lab Results:   Additional Information SSN: Shelly [place], [place]

## 2016-03-04 NOTE — Evaluation (Signed)
Speech Language Pathology Evaluation Patient Details Name: Dale [name] MRN: [id number] DOB: 10/07/1966 Today's Date: 03/04/2016 Time: [id number] SLP Time Calculation (min) (ACUTE ONLY): 27 min  Problem List:  Patient Active Problem List   Diagnosis Date Noted  . Tracheostomy care (HCC)   . Respiratory failure (HCC) 03/03/2016  . Acute respiratory failure with hypoxia (HCC)   . Septic shock (HCC)   . Cardiogenic shock (HCC)   . Acute systolic heart failure (HCC)   . Cardiac arrest (HCC)   . Hydrocephalus   . Altered mental status   . CHF (congestive heart failure) (HCC)   . 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 Homeless 02/16/2016  . Meningitis, streptococcal 02/16/2016  . Tobacco abuse 02/16/2016  . AKI (acute kidney injury) (HCC) 02/16/2016  . Gram positive sepsis (HCC) 02/16/2016  . Encephalopathy, metabolic 02/16/2016  . Underweight 02/16/2016  . ETOH abuse   . Substance abuse   . Acute encephalopathy   . Acute respiratory failure [facility](HCC)    Past Medical History:  Past Medical History  Diagnosis Date  . Depression    Past Surgical History:  Past Surgical History  Procedure Laterality Date  . Ankle surgery    . Cardiac catheterization N/A 02/17/2016    Procedure: IABP Insertion;  Surgeon: Iran OuchMuhammad A Arida, MD;  Location: [facility] INVASIVE CV LAB;  Service: Cardiovascular;  Laterality: N/A;   HPI:  50 y.o. male with h/o depression who [facility] with URI symptoms and was d/c with Tamiflu. Family brought pt to ED 3/14 with leukocytosis, fever of 103, decreased consciousness and minimally responsive. 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 Pt intubated in ED, trach placed 3/24 due to inability to wean off vent.   Assessment / Plan / Recommendation Clinical Impression  Noted bloody secretions in suction tube upon arrival. Pt alert, however appeared confused and not responsive to  questions by SLP. Pt able to tolerate PMSV for 8 minute intervals with no evidence of back pressure observed when valve removed. Pt attempted to phonate x1 with 1-2 attempts to expectorate secretions to tracheal level. Max verbal and visual cues given to facilitate labial movement for speech/phonation without success. RR, pulse and [facility] session. Unable to produce volitional coughs. Valve assisted in movement of secretions to upper airway, however reflexive cough ineffective to remove. RN requested to suction at end of session. Recommend PMSV only to be utilized with SLP to provide skilled observation and full supervision. Will continue to follow and assess swallow function when able to tolerate valve for longer periods of time.     SLP Assessment  Patient needs continued Speech Lanaguage Pathology Services    Follow Up Recommendations   (tbd)    Frequency and Duration min 2x/week  2 weeks       Oral / Motor  Motor Speech Intelligibility: Unable to assess (comment)                       Lynita LombardLauren Cale Bethard 03/04/2016, 12:22 PM  Lynita LombardLauren Joslin Doell, Student-SLP

## 2016-03-05 ENCOUNTER — Inpatient Hospital Stay (HOSPITAL_COMMUNITY): Payer: Medicaid Other

## 2016-03-05 LAB — GLUCOSE, CAPILLARY
GLUCOSE-CAPILLARY: 104 mg/dL — AB (ref 65–99)
GLUCOSE-CAPILLARY: 108 mg/dL — AB (ref 65–99)
GLUCOSE-CAPILLARY: 97 mg/dL (ref 65–99)
Glucose-Capillary: 97 mg/dL (ref 65–99)
Glucose-Capillary: 96 mg/dL (ref 65–99)
Glucose-Capillary: 102 mg/dL — ABNORMAL HIGH (ref 65–99)

## 2016-03-05 LAB — CARBOXYHEMOGLOBIN
CARBOXYHEMOGLOBIN: 1.3 % (ref 0.5–1.5)
METHEMOGLOBIN: 0.7 % (ref 0.0–1.5)
O2 SAT: 87 %
TOTAL HEMOGLOBIN: 10.5 g/dL — AB (ref 13.5–18.0)

## 2016-03-05 LAB — PHOSPHORUS: Phosphorus: 5 mg/dL — ABNORMAL HIGH (ref 2.5–4.6)

## 2016-03-05 LAB — COMPREHENSIVE METABOLIC PANEL
ALT: 101 U/L — ABNORMAL HIGH (ref 17–63)
ANION GAP: 12 (ref 5–15)
AST: 85 U/L — ABNORMAL HIGH (ref 15–41)
Albumin: 2.4 g/dL — ABNORMAL LOW (ref 3.5–5.0)
Alkaline Phosphatase: 66 U/L (ref 38–126)
BILIRUBIN TOTAL: 0.5 mg/dL (ref 0.3–1.2)
BUN: 46 mg/dL — ABNORMAL HIGH (ref 6–20)
CO2: 23 mmol/L (ref 22–32)
Calcium: 8.7 mg/dL — ABNORMAL LOW (ref 8.9–10.3)
Chloride: 109 mmol/L (ref 101–111)
Creatinine, Ser: 1.88 mg/dL — ABNORMAL HIGH (ref 0.61–1.24)
GFR calc Af Amer: 46 mL/min — ABNORMAL LOW (ref >60)
GFR, EST NON AFRICAN AMERICAN: 40 mL/min — AB (ref >60)
Glucose, Bld: 108 mg/dL — ABNORMAL HIGH (ref 65–99)
POTASSIUM: 4.1 mmol/L (ref 3.5–5.1)
Sodium: 144 mmol/L (ref 135–145)
TOTAL PROTEIN: 6.9 g/dL (ref 6.5–8.1)

## 2016-03-05 LAB — MAGNESIUM: MAGNESIUM: 2.4 mg/dL (ref 1.7–2.4)

## 2016-03-05 MED ORDER — DEXTROSE-NACL 5-0.45 % IV SOLN
INTRAVENOUS | Status: DC
  Administered 2016-03-05: 1000 mL via INTRAVENOUS
  Administered 2016-03-06 (×2): via INTRAVENOUS
  Administered 2016-03-07 (×2): 1000 mL via INTRAVENOUS
  Administered 2016-03-08: 09:00:00 via INTRAVENOUS

## 2016-03-05 NOTE — Progress Notes (Signed)
[facility] PROGRESS NOTE     Name: Dale [name] MRN: [id number] DOB: 07/13/1966    ADMISSION DATE:  02/16/2016   TR[facility] Transfer DATE:  03/04/16    CHIEF COMPLAINT:  AMS  SUBJECTIVE:  100.7 fever yesterday evening. Patient is on trach collar for the last 3 days, still with purulent secretions but less. On TF.  Much more interactive and awake today  VITAL SIGNS: BP 100/77 mmHg  Pulse 77  Temp(Src) 97.2 F (36.2 C) (Oral)  Resp 22  Ht 5' 8.9" (1.75 m)  Wt 56 kg (123 lb 7.3 oz)  BMI 18.29 kg/m2  SpO2 100%  HEMODYNAMICS: CVP:  [3 mmHg-12 mmHg] 3 mmHg  VENTILATOR SETTINGS: Vent Mode:  [-]  FiO2 (%):  [28 %-40 %] 28 %  INTAKE / OUTPUT: I/O last 3 completed shifts: In: 3400 [I.V.:1450; NG/GT:1950] Out: 2825 [Urine:2825]  PHYSICAL EXAMINATION: General: chronically ill appearing male, NAD on TC Neuro:  Awake, follows simple commands.  HEENT: [place]/AT, PERRL, #6 trach midline, thick  Purulent secretions per trache Cardiac: RRR, Nl S1/S2, -M/R/G. Chest: resps even non-labored,  rhonchi BLF.  Abd: Soft, NT, ND and +BS. Ext: Intact, warm/dry   LABS:  PULMONARY  Recent Labs Lab 03/01/16 0430 03/02/16 0325 03/03/16 0430 03/04/16 0438 03/05/16 0614  O2SAT 68.3 83.8 74.6 77.8 87.0    CBC  Recent Labs Lab 02/28/16 0440 02/29/16 0447 03/04/16 0731  HGB 8.3* 8.3* 9.9*  HCT 26.2* 25.5* 30.0*  WBC 9.3 8.9 8.9  PLT 262 294 406*   COAGULATION No results for input(s): INR in the last 168 hours. CARDIAC No results for input(s): TROPONINI in the last 168 hours. No results for input(s): PROBNP in the last 168 hours.  CHEMISTRY  Recent Labs Lab 03/01/16 0425 03/01/16 1147 03/02/16 0320 03/03/16 0448 03/04/16 0430 03/05/16 0520  NA 133*  --  142 148* 145 144  K 3.9  --  4.1 4.2 4.0 4.1  CL 100*  --  104 105 106 109  CO2 26  --  29 31 26 23   GLUCOSE 316*  --  111* 107* 214* 108*  BUN 26*  --  30* 38* 44* 46*  CREATININE 1.42*  --  1.18 1.50* 1.81* 1.88*  CALCIUM  8.1*  --  8.8* 9.1 8.2* 8.7*  MG  --  2.7* 2.6* 2.2 2.2 2.4  PHOS  --  5.3* 5.4* 6.5* 6.0* 5.0*   Estimated Creatinine Clearance: 36.8 mL/min (by C-G formula based on Cr of 1.88).  LIVER  Recent Labs Lab 02/29/16 0447 03/05/16 0520  AST 25 85*  ALT 38 101*  ALKPHOS 46 66  BILITOT 0.1* 0.5  PROT 5.1* 6.9  ALBUMIN 1.7* 2.4*   INFECTIOUS No results for input(s): LATICACIDVEN, PROCALCITON in the last 168 hours.   ENDOCRINE CBG (last 3)   Recent Labs  03/04/16 2358 03/05/16 0419 03/05/16 0818  GLUCAP 108* 97 96   IMAGING x48h No results found. STUDIES:  3/14 CT head > multifocal sinusitis, enlarged ventricles 3/14 LP >> glucose < 20, protein > 600, WBC 340 (88%N), 89 RBC  CULTURES: 3/14 Blood >> strep pneumo 3/14 Urine >> neg 3/14 CSF >> strep pneumo  ANTIBIOTICS: Anti-infectives    Start     Dose/Rate Route Frequency Ordered Stop   02/22/16 0930  penicillin g benzathine (BICILLIN LA) 1200000 UNIT/2ML injection 2.4 Million Units     2.4 Million Units Intramuscular Weekly 02/22/16 0836 03/14/16 0929   02/17/16 1430  vancomycin (VANCOCIN) IVPB 750  mg/150 ml premix  Status:  Discontinued     750 mg 150 mL/hr over 60 Minutes Intravenous Every 24 hours 02/16/16 1447 02/17/16 0805   02/17/16 1200  cefTRIAXone (ROCEPHIN) 1 g in dextrose 5 % 50 mL IVPB  Status:  Discontinued     1 g 100 mL/hr over 30 Minutes Intravenous Every 24 hours 02/16/16 1143 02/16/16 1144   02/17/16 1200  cefTRIAXone (ROCEPHIN) 2 g in dextrose 5 % 50 mL IVPB  Status:  Discontinued     2 g 100 mL/hr over 30 Minutes Intravenous Every 24 hours 02/16/16 1144 02/16/16 1447   02/17/16 1200  ampicillin (OMNIPEN) 2 g in sodium chloride 0.9 % 50 mL IVPB  Status:  Discontinued     2 g 150 mL/hr over 20 Minutes Intravenous 4 times per day 02/17/16 0803 02/17/16 0812   02/17/16 0830  vancomycin (VANCOCIN) 500 mg in sodium chloride 0.9 % 100 mL IVPB  Status:  Discontinued     500 mg 100 mL/hr over 60  Minutes Intravenous Every 12 hours 02/17/16 0805 02/19/16 1407   02/17/16 0000  cefTRIAXone (ROCEPHIN) 2 g in dextrose 5 % 50 mL IVPB     2 g 100 mL/hr over 30 Minutes Intravenous Every 12 hours 02/16/16 1447 02/29/16 1338   02/16/16 1500  vancomycin (VANCOCIN) IVPB 1000 mg/200 mL premix     1,000 mg 200 mL/hr over 60 Minutes Intravenous  Once 02/16/16 1447 02/16/16 2122   02/16/16 1500  ampicillin (OMNIPEN) 2 g in sodium chloride 0.9 % 50 mL IVPB  Status:  Discontinued     2 g 150 mL/hr over 20 Minutes Intravenous 3 times per day 02/16/16 1447 02/17/16 0803   02/16/16 1145  cefTRIAXone (ROCEPHIN) 2 g in dextrose 5 % 50 mL IVPB  Status:  Discontinued     2 g 100 mL/hr over 30 Minutes Intravenous  Once 02/16/16 1130 02/16/16 1136   02/16/16 1145  cefTRIAXone (ROCEPHIN) 1 g in dextrose 5 % 50 mL IVPB  Status:  Discontinued     1 g 100 mL/hr over 30 Minutes Intravenous Every 24 hours 02/16/16 1136 02/16/16 1143   02/16/16 1145  cefTRIAXone (ROCEPHIN) 2 g in dextrose 5 % 50 mL IVPB     2 g 100 mL/hr over 30 Minutes Intravenous  Once 02/16/16 1143 02/16/16 1711       LINES/TUBES: 3/14 ETT >> 3/24 3/15 Rt femoral CVL >> 3/24 3/15 Rt femoral aline >> out 3/15 Rt IJ introducer >> out  3/24 RUE PICC >>   SIGNIFICANT EVENTS: 3/14  Admit, VDRF, ID/cardiology/neurology consulted 3/15  Cardiac arrest, 5 mins, Asystole > VF > ST; ARDS, septic/cardiogenic shock >> IABP; start paralytic 3/16  Remains on paralytic, pressors, inotropes. 3/18  IABP to be removed 3/18  Nimbex discontinued  3/24  Bedside trach    DISCUSSION: 50 y/o reportedly homeless male, smoker, with PMH of ETOH abuse admitted 3/14 for worsening upper respiratory symptoms.  Developed progressive confusion, lactic acidosis, AKI. 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 Suffered cardiac arrest, cardiogenic shock with acute systolic CHF, ARDS 3/15.  Trach 3/24.    ASSESSMENT / PLAN: S/P Acute respiratory failure 2nd to ARDS, acute  pulmonary edema S/P Tracheostomy - 3/24 Former Tobacco Abuse Pleural effusion on cxr 3/27 Daily trach collar >> off vent 72 hours, continues to have purulent secretions from the trach,once with less secretions, plan to do PMV trials Last chest x-ray 3/30 showed bilateral airspace disease consistent with pulmonary infiltrate/edema,  repeat chest x-ray tomorrow PRN albuterol , incentive spirometry Lasix discontinued because of worsening kidney function Repeat pro-calcitonin in the setting of fever     S/P Septic/cardiogenic shock - resolved, in setting of streptococcal bacteremia and meningitis Acute systolic CHF  CHF: EF 20-25% on 3/16 echo. Repeat ECHO with EF 50-55%.  Cardiac arrest 3/15 CXR with B pleural effusion -- worse (3/27) NSTEMI Cardiology following, last seen on 3/24 by the heart failure team Continue amiodarone, coreg, bidil & hold spironolactone [worsening renal function] Lasix on hold because of worsening renal function      AKI 2nd to ATN. Creatinine on the rise, over diuresis? 1.5>1.81>1.88 Hypokalemia-repleted Hypernatremia-start half normal saline given worsening renal function Closely follow renal function, sodium Lasix on hold     Nutrition Shock liver - AST/ALT slightly higher, continue to follow Continue tube feeds, D5 IV fluids Protonix Speech therapy consultation, patient may need PEG , long-term, daily assessments    Anemia  Leukocytosis - resolved  F/u CBC Heparin for DVT prophylaxis     Streptococcal bacteremia and meningitis RPR positive Day 14 ceftriaxone, decadron per ID  >> dcd on 3/27 Pen G 2.4 million units IM weekly until 4/10 No acitive infxn for now   Hypoglycemia Relative adrenal insufficiency >> cortisol 15.2 from 3/15 SSI with CBG Q4 Cont insulin coverage.  Steroids weaned off   Acute metabolic encephalopathy >> clinically better.  Hx of ETOH, THC Streptococcal meningitis  Thiamine, folic acid Off sedation Anca  neg, ana pos, ds-dna neg results, speckled pattern noticed.    Disposition: . Continue SDU. discharged delayed due to ongoing medical issues as above, He needs placement but awating medicaid.     Richarda Overlie , MD 03/05/2016, 10:00 AM  Pager [phone]

## 2016-03-05 NOTE — Progress Notes (Signed)
[place] applied and mats to floor pt noted pulling at lines and trying to turn over in bed

## 2016-03-06 LAB — COMPREHENSIVE METABOLIC PANEL
ALT: 72 U/L — ABNORMAL HIGH (ref 17–63)
AST: 42 U/L — ABNORMAL HIGH (ref 15–41)
Albumin: 2.3 g/dL — ABNORMAL LOW (ref 3.5–5.0)
Alkaline Phosphatase: 65 U/L (ref 38–126)
Anion gap: 12 (ref 5–15)
BUN: 42 mg/dL — ABNORMAL HIGH (ref 6–20)
CHLORIDE: 108 mmol/L (ref 101–111)
CO2: 24 mmol/L (ref 22–32)
Calcium: 8.9 mg/dL (ref 8.9–10.3)
Creatinine, Ser: 1.65 mg/dL — ABNORMAL HIGH (ref 0.61–1.24)
GFR, EST AFRICAN AMERICAN: 54 mL/min — AB (ref >60)
GFR, EST NON AFRICAN AMERICAN: 47 mL/min — AB (ref >60)
Glucose, Bld: 100 mg/dL — ABNORMAL HIGH (ref 65–99)
POTASSIUM: 4.2 mmol/L (ref 3.5–5.1)
SODIUM: 144 mmol/L (ref 135–145)
Total Bilirubin: 0.3 mg/dL (ref 0.3–1.2)
Total Protein: 6.8 g/dL (ref 6.5–8.1)

## 2016-03-06 LAB — CARBOXYHEMOGLOBIN
Carboxyhemoglobin: 1.1 % (ref 0.5–1.5)
Methemoglobin: 0.7 % (ref 0.0–1.5)
O2 Saturation: 78 %
Total hemoglobin: 9.8 g/dL — ABNORMAL LOW (ref 13.5–18.0)

## 2016-03-06 LAB — GLUCOSE, CAPILLARY
GLUCOSE-CAPILLARY: 106 mg/dL — AB (ref 65–99)
GLUCOSE-CAPILLARY: 80 mg/dL (ref 65–99)
GLUCOSE-CAPILLARY: 99 mg/dL (ref 65–99)
Glucose-Capillary: 93 mg/dL (ref 65–99)
Glucose-Capillary: 98 mg/dL (ref 65–99)
Glucose-Capillary: 99 mg/dL (ref 65–99)

## 2016-03-06 LAB — CBC
HCT: 15.7 % — ABNORMAL LOW (ref 39.0–52.0)
HCT: 29.1 % — ABNORMAL LOW (ref 39.0–52.0)
HEMOGLOBIN: 5 g/dL — AB (ref 13.0–17.0)
Hemoglobin: 9.8 g/dL — ABNORMAL LOW (ref 13.0–17.0)
MCH: 28.9 pg (ref 26.0–34.0)
MCH: 31.1 pg (ref 26.0–34.0)
MCHC: 31.8 g/dL (ref 30.0–36.0)
MCHC: 33.7 g/dL (ref 30.0–36.0)
MCV: 90.8 fL (ref 78.0–100.0)
MCV: 92.4 fL (ref 78.0–100.0)
PLATELETS: 449 10*3/uL — AB (ref 150–400)
Platelets: 249 10*3/uL (ref 150–400)
RBC: 1.73 MIL/uL — ABNORMAL LOW (ref 4.22–5.81)
RBC: 3.15 MIL/uL — ABNORMAL LOW (ref 4.22–5.81)
RDW: 14.6 % (ref 11.5–15.5)
RDW: 14.6 % (ref 11.5–15.5)
WBC: 4 10*3/uL (ref 4.0–10.5)
WBC: 6.7 10*3/uL (ref 4.0–10.5)

## 2016-03-06 LAB — C DIFFICILE QUICK SCREEN W PCR REFLEX
C DIFFICILE (CDIFF) TOXIN: NEGATIVE
C DIFFICLE (CDIFF) ANTIGEN: NEGATIVE
C Diff interpretation: NEGATIVE

## 2016-03-06 LAB — PHOSPHORUS: PHOSPHORUS: 4.8 mg/dL — AB (ref 2.5–4.6)

## 2016-03-06 LAB — MAGNESIUM: MAGNESIUM: 2.4 mg/dL (ref 1.7–2.4)

## 2016-03-06 MED ORDER — ALTEPLASE 2 MG IJ SOLR
2.0000 mg | Freq: Once | INTRAMUSCULAR | Status: AC
  Administered 2016-03-06: 2 mg

## 2016-03-06 NOTE — Progress Notes (Addendum)
[facility] PROGRESS NOTE     Name: Dale [name] MRN: [id number] DOB: 1966/09/16    ADMISSION DATE:  02/16/2016   [facility] Transfer DATE:  03/04/16    CHIEF COMPLAINT:  AMS  SUBJECTIVE:  Afebrile overnight, eyes open but extremely slow to respond,   VITAL SIGNS: BP 94/73 mmHg  Pulse 65  Temp(Src) 97.4 F (36.3 C) (Axillary)  Resp 16  Ht 5' 8.9" (1.75 m)  Wt 55.3 kg (121 lb 14.6 oz)  BMI 18.06 kg/m2  SpO2 99%  HEMODYNAMICS: CVP:  [4 mmHg-9 mmHg] 9 mmHg  VENTILATOR SETTINGS: Vent Mode:  [-]  FiO2 (%):  [28 %] 28 %  INTAKE / OUTPUT: I/O last 3 completed shifts: In: 2970 [I.V.:1475; NG/GT:1495] Out: 3275 [Urine:2475; Stool:800]  PHYSICAL EXAMINATION: General: chronically ill appearing male, NAD on TC Neuro:  Awake, follows simple commands.  HEENT: [place]/AT, PERRL, #6 trach midline, thick  Purulent secretions per trache Cardiac: RRR, Nl S1/S2, -M/R/G. Chest: resps even non-labored,  rhonchi BLF.  Abd: Soft, NT, ND and +BS. Ext: Intact, warm/dry   LABS:  PULMONARY  Recent Labs Lab 03/02/16 0325 03/03/16 0430 03/04/16 0438 03/05/16 0614 03/06/16 0630  O2SAT 83.8 74.6 77.8 87.0 78.0    CBC  Recent Labs Lab 03/04/16 0731 03/06/16 0345 03/06/16 0635  HGB 9.9* 5.0* 9.8*  HCT 30.0* 15.7* 29.1*  WBC 8.9 4.0 6.7  PLT 406* 249 449*   COAGULATION No results for input(s): INR in the last 168 hours. CARDIAC No results for input(s): TROPONINI in the last 168 hours. No results for input(s): PROBNP in the last 168 hours.  CHEMISTRY  Recent Labs Lab 03/02/16 0320 03/03/16 0448 03/04/16 0430 03/05/16 0520 03/06/16 0635  NA 142 148* 145 144 144  K 4.1 4.2 4.0 4.1 4.2  CL 104 105 106 109 108  CO2 GLUCOSE 111* 107* 214* 108* 100*  BUN 30* 38* 44* 46* 42*  CREATININE 1.18 1.50* 1.81* 1.88* 1.65*  CALCIUM 8.8* 9.1 8.2* 8.7* 8.9  MG 2.6* 2.2 2.2 2.4 2.4  PHOS 5.4* 6.5* 6.0* 5.0* 4.8*   Estimated Creatinine Clearance: 41.4 mL/min (by C-G formula  based on Cr of 1.65).  LIVER  Recent Labs Lab 02/29/16 0447 03/05/16 0520 03/06/16 0635  AST 25 85* 42*  ALT 38 101* 72*  ALKPHOS 46 66 65  BILITOT 0.1* 0.5 0.3  PROT 5.1* 6.9 6.8  ALBUMIN 1.7* 2.4* 2.3*   INFECTIOUS No results for input(s): LATICACIDVEN, PROCALCITON in the last 168 hours.   ENDOCRINE CBG (last 3)   Recent Labs  03/05/16 2047 03/06/16 0004 03/06/16 0406  GLUCAP 104* 93 106*   IMAGING x48h Dg Chest 1 View  03/05/2016  CLINICAL DATA:  Hypoxia. EXAM: CHEST 1 VIEW COMPARISON:  March 03, 2016. FINDINGS: Stable cardiomediastinal silhouette. Tracheostomy and nasogastric tubes are unchanged in position. Right-sided PICC line is stable. No pneumothorax is noted. Improved bibasilar opacities are noted consistent with improving pulmonary edema. Bony thorax is unremarkable. IMPRESSION: Stable support apparatus. Improved bibasilar opacities consistent with improving edema. Electronically Signed   By: Lupita Raider, M.D.   On: 03/05/2016 11:58   STUDIES:  3/14 CT head > multifocal sinusitis, enlarged ventricles 3/14 LP >> glucose < 20, protein > 600, WBC 340 (88%N), 89 RBC  CULTURES: 3/14 Blood >> strep pneumo 3/14 Urine >> neg 3/14 CSF >> strep pneumo  ANTIBIOTICS: Anti-infectives    Start     Dose/Rate Route Frequency Ordered Stop  02/22/16 0930  penicillin g benzathine (BICILLIN LA) 1200000 UNIT/2ML injection 2.4 Million Units     2.4 Million Units Intramuscular Weekly 02/22/16 0836 03/14/16 0929   02/17/16 1430  vancomycin (VANCOCIN) IVPB 750 mg/150 ml premix  Status:  Discontinued     750 mg 150 mL/hr over 60 Minutes Intravenous Every 24 hours 02/16/16 1447 02/17/16 0805   02/17/16 1200  cefTRIAXone (ROCEPHIN) 1 g in dextrose 5 % 50 mL IVPB  Status:  Discontinued     1 g 100 mL/hr over 30 Minutes Intravenous Every 24 hours 02/16/16 1143 02/16/16 1144   02/17/16 1200  cefTRIAXone (ROCEPHIN) 2 g in dextrose 5 % 50 mL IVPB  Status:  Discontinued     2  g 100 mL/hr over 30 Minutes Intravenous Every 24 hours 02/16/16 1144 02/16/16 1447   02/17/16 1200  ampicillin (OMNIPEN) 2 g in sodium chloride 0.9 % 50 mL IVPB  Status:  Discontinued     2 g 150 mL/hr over 20 Minutes Intravenous 4 times per day 02/17/16 0803 02/17/16 0812   02/17/16 0830  vancomycin (VANCOCIN) 500 mg in sodium chloride 0.9 % 100 mL IVPB  Status:  Discontinued     500 mg 100 mL/hr over 60 Minutes Intravenous Every 12 hours 02/17/16 0805 02/19/16 1407   02/17/16 0000  cefTRIAXone (ROCEPHIN) 2 g in dextrose 5 % 50 mL IVPB     2 g 100 mL/hr over 30 Minutes Intravenous Every 12 hours 02/16/16 1447 02/29/16 1338   02/16/16 1500  vancomycin (VANCOCIN) IVPB 1000 mg/200 mL premix     1,000 mg 200 mL/hr over 60 Minutes Intravenous  Once 02/16/16 1447 02/16/16 2122   02/16/16 1500  ampicillin (OMNIPEN) 2 g in sodium chloride 0.9 % 50 mL IVPB  Status:  Discontinued     2 g 150 mL/hr over 20 Minutes Intravenous 3 times per day 02/16/16 1447 02/17/16 0803   02/16/16 1145  cefTRIAXone (ROCEPHIN) 2 g in dextrose 5 % 50 mL IVPB  Status:  Discontinued     2 g 100 mL/hr over 30 Minutes Intravenous  Once 02/16/16 1130 02/16/16 1136   02/16/16 1145  cefTRIAXone (ROCEPHIN) 1 g in dextrose 5 % 50 mL IVPB  Status:  Discontinued     1 g 100 mL/hr over 30 Minutes Intravenous Every 24 hours 02/16/16 1136 02/16/16 1143   02/16/16 1145  cefTRIAXone (ROCEPHIN) 2 g in dextrose 5 % 50 mL IVPB     2 g 100 mL/hr over 30 Minutes Intravenous  Once 02/16/16 1143 02/16/16 1711       LINES/TUBES: 3/14 ETT >> 3/24 3/15 Rt femoral CVL >> 3/24 3/15 Rt femoral aline >> out 3/15 Rt IJ introducer >> out  3/24 RUE PICC >>   SIGNIFICANT EVENTS: 3/14  Admit, VDRF, ID/cardiology/neurology consulted 3/15  Cardiac arrest, 5 mins, Asystole > VF > ST; ARDS, septic/cardiogenic shock >> IABP; start paralytic 3/16  Remains on paralytic, pressors, inotropes. 3/18  IABP to be removed 3/18  Nimbex discontinued   3/24  Bedside trach    Brief summary 50 y/o reportedly homeless male, smoker, with PMH of ETOH abuse admitted 3/14 for worsening upper respiratory symptoms.  Developed progressive confusion, lactic acidosis, AKI. 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 Suffered cardiac arrest, cardiogenic shock with acute systolic CHF, ARDS 3/15.  Trach 3/24.    ASSESSMENT / PLAN: S/P Acute respiratory failure 2nd to ARDS, acute pulmonary edema S/P Tracheostomy - 3/24 Former Tobacco Abuse Pleural effusion on cxr  3/27 Daily trach collar >> off vent 96 hours, continues to have purulent secretions from the trach,once with less secretions, plan to do PMV trials Last chest x-ray 4/1 shows improved bibasilar opacities consistent with improving pulmonary edema infiltrate/edema,  PRN albuterol , incentive spirometry Lasix discontinued because of worsening kidney function Strict aspiration precautions     S/P Septic/cardiogenic shock - resolved, in setting of streptococcal bacteremia and meningitis Acute systolic CHF  CHF: EF 20-25% on 3/16 echo. Repeat ECHO with EF 50-55%.  Cardiac arrest 3/15 CXR with B pleural effusion -- worse (3/27) NSTEMI Cardiology following, last seen on 3/24 by the heart failure team Continue amiodarone, coreg, held bidil &   spironolactone [worsening renal function]:/Hypotension Lasix on hold because of worsening renal function      AKI 2nd to ATN. Creatinine on the rise, over diuresis? 1.5>1.81>1.88 Hypokalemia-repleted Hypernatremia-start half normal saline given worsening renal function Closely follow renal function, sodium Lasix on hold     Nutrition Shock liver - AST/ALT slightly higher, continue to follow Continue tube feeds, D5 IV fluids Protonix Speech therapy consultation, patient may need PEG , long-term, daily assessments    Anemia  Leukocytosis - resolved  F/u CBC Heparin for DVT prophylaxis     Streptococcal bacteremia and meningitis RPR positive Day  14 ceftriaxone, decadron per ID  >> dcd on 3/27 Pen G 2.4 million units IM weekly until 4/10 No acitive infxn for now   Hypoglycemia Relative adrenal insufficiency >> cortisol 15.2 from 3/15 SSI with CBG Q4 Cont insulin coverage.  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 Status post neurology consultation 3/14  Initial neurology consultation on 3/14 recommended MRI whenever able to do so EEG showed burst suppression pattern likely from a combination of fentanyl, in addition to severe encephalopathy. No seizures noted on the EEG.  Overall, poor prognosis for neurological recovery.    Hx of ETOH, THC Streptococcal meningitis  Thiamine, folic acid Off sedation Anca neg, ana pos, ds-dna neg results, speckled pattern noticed.    Disposition: . Continue SDU. discharged delayed due to ongoing medical issues as above, He needs placement but awating medicaid. PEG tube placement on Monday. As per neurology overall prognosis for neurological recovery is poor ( neurology notes on 3/15)    Richarda Overlie , MD 03/06/2016, 8:57 AM  Pager [phone]

## 2016-03-06 NOTE — Progress Notes (Signed)
CRITICAL VALUE ALERT  Critical value received:  Hemoglobin 5  Date of notification:  03/06/2016   Time of notification:  0435  Critical value read back: yes  Nurse who received alert:  Birdena CrandallAmanda Toshiko Kemler RN  MD notified (1st page):  Lenny Pastelom Callahan  Time of first page:  4:38 AM   MD notified (2nd page):  Time of second page:  Responding MD:   Time MD responded:  [phone]

## 2016-03-06 NOTE — Progress Notes (Signed)
Have been notified that CMET sample is degraded or diluted.  Therefore, will redraw both am cbc and cmet now

## 2016-03-07 ENCOUNTER — Inpatient Hospital Stay (HOSPITAL_COMMUNITY): Payer: Medicaid Other

## 2016-03-07 DIAGNOSIS — Z7189 Other specified counseling: Secondary | ICD-10-CM

## 2016-03-07 DIAGNOSIS — Z515 Encounter for palliative care: Secondary | ICD-10-CM

## 2016-03-07 LAB — COMPREHENSIVE METABOLIC PANEL
ALBUMIN: 2.4 g/dL — AB (ref 3.5–5.0)
ALT: 59 U/L (ref 17–63)
ANION GAP: 12 (ref 5–15)
AST: 34 U/L (ref 15–41)
Alkaline Phosphatase: 62 U/L (ref 38–126)
BILIRUBIN TOTAL: 0.4 mg/dL (ref 0.3–1.2)
BUN: 41 mg/dL — ABNORMAL HIGH (ref 6–20)
CO2: 25 mmol/L (ref 22–32)
Calcium: 8.8 mg/dL — ABNORMAL LOW (ref 8.9–10.3)
Chloride: 108 mmol/L (ref 101–111)
Creatinine, Ser: 1.47 mg/dL — ABNORMAL HIGH (ref 0.61–1.24)
GFR, EST NON AFRICAN AMERICAN: 54 mL/min — AB (ref >60)
Glucose, Bld: 93 mg/dL (ref 65–99)
POTASSIUM: 4.3 mmol/L (ref 3.5–5.1)
Sodium: 145 mmol/L (ref 135–145)
Total Protein: 6.6 g/dL (ref 6.5–8.1)

## 2016-03-07 LAB — GLUCOSE, CAPILLARY
GLUCOSE-CAPILLARY: 104 mg/dL — AB (ref 65–99)
GLUCOSE-CAPILLARY: 88 mg/dL (ref 65–99)
GLUCOSE-CAPILLARY: 88 mg/dL (ref 65–99)
GLUCOSE-CAPILLARY: 97 mg/dL (ref 65–99)
Glucose-Capillary: 94 mg/dL (ref 65–99)
Glucose-Capillary: 91 mg/dL (ref 65–99)
Glucose-Capillary: 81 mg/dL (ref 65–99)
Glucose-Capillary: 101 mg/dL — ABNORMAL HIGH (ref 65–99)

## 2016-03-07 LAB — CARBOXYHEMOGLOBIN
CARBOXYHEMOGLOBIN: 1.4 % (ref 0.5–1.5)
Methemoglobin: 0.7 % (ref 0.0–1.5)
O2 SAT: 85.3 %
TOTAL HEMOGLOBIN: 9.8 g/dL — AB (ref 13.5–18.0)

## 2016-03-07 MED ORDER — SODIUM CHLORIDE 0.9 % IV BOLUS (SEPSIS)
500.0000 mL | Freq: Once | INTRAVENOUS | Status: AC
  Administered 2016-03-07: 500 mL via INTRAVENOUS

## 2016-03-07 MED ORDER — RESOURCE THICKENUP CLEAR PO POWD
ORAL | Status: DC | PRN
  Administered 2016-03-08: 5.6 g via ORAL
  Filled 2016-03-07: qty 125

## 2016-03-07 NOTE — Progress Notes (Signed)
MBSS complete. Full report located under chart review in imaging section. Recommend: Dys 1, nectar thick liquids, crush meds, wear PMSV with all meals/meds. MUST have FULL supervision.     Breck CoonsLisa Willis SumnerLitaker M.Ed [employer]-SLP Pager [phone]

## 2016-03-07 NOTE — Progress Notes (Signed)
CSW received call from [place] [facility] disability/Medicaid- CSW to assist in anyway needed.  CSW sent out additional updates to SNFs- pt is now on 21% trach collar which would be acceptable at SNF- RN in progression wondering about pt ability to have trach removed  CSW will continue to follow- no bed offers at this time  Merlyn LotJenna Holoman, Mclaren Bay RegionalCSWA Clinical Social Worker [phone]

## 2016-03-07 NOTE — Progress Notes (Signed)
Speech Language Pathology Treatment: Dale [name] Speaking valve  Patient Details Name: Dale [name] MRN: [id number] DOB: 10/02/1966 Today's Date: 03/07/2016 Time: [id number] SLP Time Calculation (min) (ACUTE ONLY): 25 min  Assessment / Plan / Recommendation Clinical Impression  Pt much more alert and responsive today than when last seen by this clinician (3/31). Responsive to name and yes/no questions and able to follow 1-step directions. SpO2, pulse and RR remained WFL with PMSV donned. Pt made more attempts to communicate to SLP this session than previously, however reduced breath support resulted in significantly decreased intelligibilty. SLP provided instruction on lower abdominal breathing and with max verbal, tactile and visual cues however unable to coordinate respiration and phonation. SLP also provided instruction on chunking (1-2 words/breath) to increase intelligibility, yet pt unable to apply due to confusion. Pt tolerated PMSV for 45 minutes with no evidence of back pressure observed. No significant secretions during session. Recommend pt utilize PMSV with full supervision and with staff only. SLP will continue to follow.   HPI HPI: 50 y.o. male with h/o depression who [facility] with URI symptoms and was d/c with Tamiflu. Family brought pt to ED 3/14 with leukocytosis, fever of 103, decreased consciousness and minimally responsive. 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 Pt intubated in ED, trach placed 3/24 due to inability to wean off vent.      SLP Plan  Continue with current plan of care     Recommendations  Medication Administration: Via alternative means      Patient may use Passy-[name] Speech Valve: During all therapies with supervision;During PO intake/meals PMSV Supervision: Full MD: Please consider changing trach tube to : Cuffless      Oral Care Recommendations: Oral care QID Follow up  Recommendations:  (TBD) Plan: Continue with current plan of care     Dale [name], Student-SLP     Dale [name] 03/07/2016, 10:15 AM

## 2016-03-07 NOTE — Progress Notes (Signed)
Speech Language Pathology Treatment:    Patient Details Name: Dale [name] MRN: [id number] DOB: 09/10/1966 Today's Date: 03/07/2016 Time: 0818-     Pt much more alert. Able to initiate swallow during swallow assessment. MBS recommended to assess swallow function, scheduled for 9:30 today. Full report will be documented.   Breck CoonsLisa Willis CheverlyLitaker M.Ed [employer]P Pager [phone]

## 2016-03-07 NOTE — Progress Notes (Signed)
[facility] PROGRESS NOTE     Name: Dale [name] MRN: [id number] DOB: May 01, 1966    ADMISSION DATE:  02/16/2016   [facility] Transfer DATE:  03/04/16    CHIEF COMPLAINT:  AMS  SUBJECTIVE:  Afebrile overnight, eyes open but extremely slow to respond,   VITAL SIGNS: BP 83/65 mmHg  Pulse 67  Temp(Src) 97.4 F (36.3 C) (Oral)  Resp 16  Ht 5' 8.9" (1.75 m)  Wt 57.8 kg (127 lb 6.8 oz)  BMI 18.87 kg/m2  SpO2 99%  HEMODYNAMICS: CVP:  [6 mmHg-10 mmHg] 10 mmHg  VENTILATOR SETTINGS: Vent Mode:  [-]  FiO2 (%):  [28 %] 28 %  INTAKE / OUTPUT: I/O last 3 completed shifts: In: 2535 [I.V.:1315; Other:50; NG/GT:1170] Out: 3325 [Urine:2525; Stool:800]  PHYSICAL EXAMINATION: General: chronically ill appearing male, NAD on TC Neuro:  Awake, follows simple commands.  HEENT: [place]/AT, PERRL, #6 trach midline, thick  Purulent secretions per trache Cardiac: RRR, Nl S1/S2, -M/R/G. Chest: resps even non-labored,  rhonchi BLF.  Abd: Soft, NT, ND and +BS. Ext: Intact, warm/dry   LABS:  PULMONARY  Recent Labs Lab 03/03/16 0430 03/04/16 0438 03/05/16 0614 03/06/16 0630 03/07/16 0423  O2SAT 74.6 77.8 87.0 78.0 85.3    CBC  Recent Labs Lab 03/04/16 0731 03/06/16 0345 03/06/16 0635  HGB 9.9* 5.0* 9.8*  HCT 30.0* 15.7* 29.1*  WBC 8.9 4.0 6.7  PLT 406* 249 449*   COAGULATION No results for input(s): INR in the last 168 hours. CARDIAC No results for input(s): TROPONINI in the last 168 hours. No results for input(s): PROBNP in the last 168 hours.  CHEMISTRY  Recent Labs Lab 03/02/16 0320 03/03/16 0448 03/04/16 0430 03/05/16 0520 03/06/16 0635 03/07/16 0544  NA 142 148* 145 144 144 145  K 4.1 4.2 4.0 4.1 4.2 4.3  CL 104 105 106 109 108 108  CO2 GLUCOSE 111* 107* 214* 108* 100* 93  BUN 30* 38* 44* 46* 42* 41*  CREATININE 1.18 1.50* 1.81* 1.88* 1.65* 1.47*  CALCIUM 8.8* 9.1 8.2* 8.7* 8.9 8.8*  MG 2.6* 2.2 2.2 2.4 2.4  --   PHOS 5.4* 6.5* 6.0* 5.0* 4.8*  --     Estimated Creatinine Clearance: 48.6 mL/min (by C-G formula based on Cr of 1.47).  LIVER  Recent Labs Lab 03/05/16 0520 03/06/16 0635 03/07/16 0544  AST 85* 42* 34  ALT 101* 72* 59  ALKPHOS 66 65 62  BILITOT 0.5 0.3 0.4  PROT 6.9 6.8 6.6  ALBUMIN 2.4* 2.3* 2.4*   INFECTIOUS No results for input(s): LATICACIDVEN, PROCALCITON in the last 168 hours.   ENDOCRINE CBG (last 3)   Recent Labs  03/06/16 2008 03/06/16 2348 03/07/16 0402  GLUCAP 99 88 88   IMAGING x48h Dg Chest 1 View  03/05/2016  CLINICAL DATA:  Hypoxia. EXAM: CHEST 1 VIEW COMPARISON:  March 03, 2016. FINDINGS: Stable cardiomediastinal silhouette. Tracheostomy and nasogastric tubes are unchanged in position. Right-sided PICC line is stable. No pneumothorax is noted. Improved bibasilar opacities are noted consistent with improving pulmonary edema. Bony thorax is unremarkable. IMPRESSION: Stable support apparatus. Improved bibasilar opacities consistent with improving edema. Electronically Signed   By: Lupita Raider, M.D.   On: 03/05/2016 11:58   STUDIES:  3/14 CT head > multifocal sinusitis, enlarged ventricles 3/14 LP >> glucose < 20, protein > 600, WBC 340 (88%N), 89 RBC  CULTURES: 3/14 Blood >> strep pneumo 3/14 Urine >> neg 3/14 CSF >> strep pneumo  ANTIBIOTICS: Anti-infectives    Start     Dose/Rate Route Frequency Ordered Stop   02/22/16 0930  penicillin g benzathine (BICILLIN LA) 1200000 UNIT/2ML injection 2.4 Million Units     2.4 Million Units Intramuscular Weekly 02/22/16 0836 03/14/16 0929   02/17/16 1430  vancomycin (VANCOCIN) IVPB 750 mg/150 ml premix  Status:  Discontinued     750 mg 150 mL/hr over 60 Minutes Intravenous Every 24 hours 02/16/16 1447 02/17/16 0805   02/17/16 1200  cefTRIAXone (ROCEPHIN) 1 g in dextrose 5 % 50 mL IVPB  Status:  Discontinued     1 g 100 mL/hr over 30 Minutes Intravenous Every 24 hours 02/16/16 1143 02/16/16 1144   02/17/16 1200  cefTRIAXone (ROCEPHIN) 2 g in  dextrose 5 % 50 mL IVPB  Status:  Discontinued     2 g 100 mL/hr over 30 Minutes Intravenous Every 24 hours 02/16/16 1144 02/16/16 1447   02/17/16 1200  ampicillin (OMNIPEN) 2 g in sodium chloride 0.9 % 50 mL IVPB  Status:  Discontinued     2 g 150 mL/hr over 20 Minutes Intravenous 4 times per day 02/17/16 0803 02/17/16 0812   02/17/16 0830  vancomycin (VANCOCIN) 500 mg in sodium chloride 0.9 % 100 mL IVPB  Status:  Discontinued     500 mg 100 mL/hr over 60 Minutes Intravenous Every 12 hours 02/17/16 0805 02/19/16 1407   02/17/16 0000  cefTRIAXone (ROCEPHIN) 2 g in dextrose 5 % 50 mL IVPB     2 g 100 mL/hr over 30 Minutes Intravenous Every 12 hours 02/16/16 1447 02/29/16 1338   02/16/16 1500  vancomycin (VANCOCIN) IVPB 1000 mg/200 mL premix     1,000 mg 200 mL/hr over 60 Minutes Intravenous  Once 02/16/16 1447 02/16/16 2122   02/16/16 1500  ampicillin (OMNIPEN) 2 g in sodium chloride 0.9 % 50 mL IVPB  Status:  Discontinued     2 g 150 mL/hr over 20 Minutes Intravenous 3 times per day 02/16/16 1447 02/17/16 0803   02/16/16 1145  cefTRIAXone (ROCEPHIN) 2 g in dextrose 5 % 50 mL IVPB  Status:  Discontinued     2 g 100 mL/hr over 30 Minutes Intravenous  Once 02/16/16 1130 02/16/16 1136   02/16/16 1145  cefTRIAXone (ROCEPHIN) 1 g in dextrose 5 % 50 mL IVPB  Status:  Discontinued     1 g 100 mL/hr over 30 Minutes Intravenous Every 24 hours 02/16/16 1136 02/16/16 1143   02/16/16 1145  cefTRIAXone (ROCEPHIN) 2 g in dextrose 5 % 50 mL IVPB     2 g 100 mL/hr over 30 Minutes Intravenous  Once 02/16/16 1143 02/16/16 1711       LINES/TUBES: 3/14 ETT >> 3/24 3/15 Rt femoral CVL >> 3/24 3/15 Rt femoral aline >> out 3/15 Rt IJ introducer >> out  3/24 RUE PICC >>   SIGNIFICANT EVENTS: 3/14  Admit, VDRF, ID/cardiology/neurology consulted 3/15  Cardiac arrest, 5 mins, Asystole > VF > ST; ARDS, septic/cardiogenic shock >> IABP; start paralytic 3/16  Remains on paralytic, pressors,  inotropes. 3/18  IABP to be removed 3/18  Nimbex discontinued  3/24  Bedside trach    Brief summary 50 y/o reportedly homeless male, smoker, with PMH of ETOH abuse admitted 3/14 for worsening upper respiratory symptoms.  Developed progressive confusion, lactic acidosis, AKI. 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 Suffered cardiac arrest, cardiogenic shock with acute systolic CHF, ARDS 3/15.  Trach 3/24.    ASSESSMENT / PLAN: S/P Acute respiratory failure  2nd to ARDS, acute pulmonary edema S/P Tracheostomy - 3/24 Former Tobacco Abuse Pleural effusion on cxr 3/27 Daily trach collar >> off vent since 3/30, continues to have purulent secretions from the trach,once with less secretions, improved FiO2 requirements Last chest x-ray 4/1 shows improved bibasilar opacities consistent with improving pulmonary edema infiltrate/edema,  PRN albuterol , incentive spirometry Lasix discontinued because of worsening kidney function Strict aspiration precautions,     S/P Septic/cardiogenic shock - resolved, in setting of streptococcal bacteremia and meningitis Acute systolic CHF  CHF: EF 20-25% on 3/16 echo. Repeat ECHO with EF 50-55%.  Cardiac arrest 3/15 CXR with B pleural effusion -- worse (3/27) NSTEMI Cardiology following, last seen on 3/24 by the heart failure team Continue amiodarone, coreg, held bidil &   spironolactone [worsening renal function]:/Hypotension Lasix on hold because of worsening renal function      AKI 2nd to ATN. Creatinine on the rise, over diuresis? 1.5>1.81>1.88 Hypokalemia-repleted Hypernatremia-start half normal saline given worsening renal function Closely follow renal function, sodium Lasix on hold     Nutrition Shock liver - AST/ALT slightly higher, continue to follow  held tube feeds, D5 IV fluids Protonix Speech therapy consultation,PMSV under supervision of speech therapy patient may need PEG , pending swallow evaluation, palliative care consultation  requested for further discussion with family    Anemia  Leukocytosis - resolved  F/u CBC Heparin for DVT prophylaxis     Streptococcal bacteremia and meningitis RPR positive Day 14 ceftriaxone, decadron per ID  >> dcd on 3/27 Pen G 2.4 million units IM weekly until 4/10 No acitive infxn for now   Hypoglycemia Relative adrenal insufficiency >> cortisol 15.2 from 3/15 SSI with CBG Q4, continue D5 half normal saline to prevent hypoglycemia Cont insulin coverage.  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 Status post neurology consultation 3/14  Initial neurology consultation on 3/14 recommended MRI whenever able to do so EEG showed burst suppression pattern likely from a combination of fentanyl, in addition to severe encephalopathy. No seizures noted on the EEG.  Overall, poor prognosis for neurological recovery. Therefore palliative care consultation requested   Hx of ETOH, THC Streptococcal meningitis  Thiamine, folic acid Off sedation Anca neg, ana pos, ds-dna neg results, speckled pattern noticed.    Disposition: . Continue SDU. discharged delayed due to ongoing medical issues as above, He needs placement but awating medicaid. PEG tube placement ? Pending swallow eval. As per neurology overall prognosis for neurological recovery is poor ( neurology notes on 3/15)  Discussed with patient's wife   Dale [name] , MD 03/07/2016, 7:57 AM  Pager [phone]

## 2016-03-07 NOTE — Consult Note (Signed)
Consultation Note Date: 03/07/2016   Patient Name: Dale [name]  DOB: [birth year]  MRN: [id number]  Age / Sex: 50 y.o., male  PCP: No primary care provider on file. Referring Physician: Reyne Dumas, MD  Reason for Consultation: Establishing goals of care    Clinical Assessment/Narrative:  50 y/o reportedly homeless male, smoker, with PMH of ETOH abuse admitted 3/14 for worsening upper respiratory symptoms.  Developed progressive confusion, lactic acidosis, AKI. 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 Suffered cardiac arrest, cardiogenic shock with acute systolic CHF, ARDS 5/63.  Trach 3/24.    Patient has been admitted since 02-16-16. He is s/p acute resp failure secondary to ARDS, acute pulmonary edema, he is s/p trach placement on 3-24. Also with pleural effusion. Aspiration precautions ongoing. Patient seen by speech therapy this am on 03-07-16 with recommendations for dysphagia level 1, nectar thick liquids, crush meds, wear PMSV with all meals/meds. MUST have FULL supervision. Hospital course also complicated by septic/cardiogenic shock, cardiac arrest, NSTEMI. Patient has had shock liver, acute kidney injury in this hospitalization as well.   Hence, palliative consult placed for goals of care discussions. The patient arrived back from his MBS study this am, he appears chronically ill, weak, debilitated, not able to provide any history.   Met with Dale [name] who stated that she was the patient's wife. They have been separated for 21 years, not divorced. She states she was aware of the patient's home less condition, he at times was in and out of shelters.   Recapped and reviewed the patient's hospital course. Discussed frankly that he remains at risk for acute clinical decompensation. She believes that he is improving, she states, " they brought him back 3 times so far." she is in talks with case management here regarding the  patient's medicaid pending application, she wishes for him to go to SNF for rehab.   We will continue to follow patient's disease trajectory and help guide decision making, please see below.   Contacts/Participants in Discussion: Primary Decision Maker:     Relationship to Patient   HCPOA: no   separated but not divorced from wife of 21 years Dale [name] [phone], [phone] , also has a daughter Dale [name] [phone].   SUMMARY OF RECOMMENDATIONS: After extensive discussions with the patient's wife at the bedside this am, she elects for continuation of current mode of care. She states that the patient has demonstrated improvement, her goals are for the patient to go to SNF for rehab. She has had some preliminary discussions with case management regarding SNF and medicaid arrangements.   Noted speech therapy results. Monitor PO intake, will need to engage in PEG discussions if patient not able to have PO intake enough to sustain him. It appears likely that the patient's wife will want the PEG placed if indicated. Her goals are very much for current life maintaining mode to continue.   Given the patient's complicated hospitalization course thus far, it does appear medically appropriate to consider a more palliative/ comfort based approach to his care, will continue gentle discussions and conversations with wife.   Thank you for the consult.   Code Status/Advance Care Planning: DNR    Code Status Orders        Start     Ordered   02/17/16 1331  Do not attempt resuscitation (DNR)   Continuous    Question Answer Comment  In the event of cardiac or respiratory ARREST Do not call a "code blue"  In the event of cardiac or respiratory ARREST Do not perform Intubation, CPR, defibrillation or ACLS   In the event of cardiac or respiratory ARREST Use medication by any route, position, wound care, and other measures to relive pain and suffering. May use oxygen, suction and manual treatment  of airway obstruction as needed for comfort.      02/17/16 1330    Code Status History    Date Active Date Inactive Code Status Order ID Comments User Context   02/16/2016  7:13 PM 02/17/2016  1:31 PM Full Code [id number]  Rahul Dianna Rossetti, PA-C ED      Other Directives:None  Symptom Management:    continue current care  Palliative Prophylaxis:   Delirium Protocol    Psycho-social/Spiritual:  Support System: Poor Desire for further Chaplaincy support:no Additional Recommendations: Caregiving  Support/Resources  Prognosis: guarded  Discharge Planning: [facility] for rehab with Palliative care service follow-up    Chief Complaint/ Primary Diagnoses: Present on Admission:  . Trichomonal urethritis in male . Meningitis, streptococcal . Tobacco abuse . AKI (acute kidney injury) ([place]) . Gram positive sepsis ([place]) . Encephalopathy, metabolic . Underweight . Septic shock ([place]) . Respiratory failure ([place])  I have reviewed the medical record, interviewed the patient and family, and examined the patient. The following aspects are pertinent.  Past Medical History  Diagnosis Date  . Depression    Social History   Social History  . Marital Status: Legally Separated    Spouse Name: N/A  . Number of Children: N/A  . Years of Education: N/A   Social History Main Topics  . Smoking status: Current Every Day Smoker -- 0.10 packs/day    Types: Cigarettes  . Smokeless tobacco: None  . Alcohol Use: Yes  . Drug Use: Yes    Special: Marijuana  . Sexual Activity: Not Asked   Other Topics Concern  . None   Social History Narrative   No family history on file. Scheduled Meds: . amiodarone  200 mg Oral Daily  . antiseptic oral rinse  7 mL Mouth Rinse QID  . aspirin  81 mg Oral Daily  . carvedilol  6.25 mg Oral BID WC  . chlorhexidine gluconate (SAGE KIT)  15 mL Mouth Rinse BID  . folic acid  1 mg Per Tube Daily  . heparin subcutaneous  5,000 Units  Subcutaneous 3 times per day  . insulin aspart  0-20 Units Subcutaneous 6 times per day  . pantoprazole sodium  40 mg Per Tube Q24H  . penicillin g benzathine (BICILLIN-LA) IM  2.4 Million Units Intramuscular Weekly  . sodium chloride flush  10-40 mL Intracatheter Q12H  . thiamine  100 mg Per Tube Daily   Continuous Infusions: . dextrose 5 % and 0.45% NaCl 1,000 mL (03/07/16 0634)  . feeding supplement (VITAL AF 1.2 CAL) Stopped (03/07/16 0000)   PRN Meds:.acetaminophen (TYLENOL) oral liquid 160 mg/5 mL, albuterol, fentaNYL (SUBLIMAZE) injection, RESOURCE THICKENUP CLEAR Medications Prior to Admission:  Prior to Admission medications   Medication Sig Start Date End Date Taking? Authorizing Provider  Omega-3 Fatty Acids (FISH OIL) 1000 MG CAPS Take 1,000 mg by mouth every Friday.   Yes Historical Provider, MD  methocarbamol (ROBAXIN) 500 MG tablet Take 1 tablet (500 mg total) by mouth 2 (two) times daily. Patient not taking: Reported on 07/30/2015 08/25/14   Tatyana Kirichenko, PA-C  naproxen (NAPROSYN) 500 MG tablet Take 1 tablet (500 mg total) by mouth 2 (two) times daily. Patient not taking:  Reported on 07/30/2015 08/25/14   Jeannett Senior, PA-C  oseltamivir (TAMIFLU) 75 MG capsule Take 1 capsule (75 mg total) by mouth every 12 (twelve) hours. 02/13/16   Hostetter Lions, PA-C  traMADol (ULTRAM) 50 MG tablet Take 1 tablet (50 mg total) by mouth every 6 (six) hours as needed. Patient not taking: Reported on 07/30/2015 08/25/14   Jeannett Senior, PA-C   No Known Allergies  Review of Systems Patient non verbal  Physical Exam Patient assessed when back from MBS study this am Opens eyes, tracks  Does not engage much other wise S1 S2 Shallow anterior breath sounds Abdomen soft Trace edema Thin extremities Appears weak deconditioned, chronically ill  Vital Signs: BP 79/61 mmHg  Pulse 63  Temp(Src) 97 F (36.1 C) (Oral)  Resp 20  Ht 5' 8.9" (1.75 m)  Wt 57.8 kg (127 lb  6.8 oz)  BMI 18.87 kg/m2  SpO2 100%  SpO2: SpO2: 100 % O2 Device:SpO2: 100 % O2 Flow Rate: .O2 Flow Rate (L/min): 5 L/min  IO: Intake/output summary:  Intake/Output Summary (Last 24 hours) at 03/07/16 1239 Last data filed at 03/07/16 1101  Gross per 24 hour  Intake 1632.5 ml  Output   1225 ml  Net  407.5 ml    LBM: Last BM Date: 03/06/16 Baseline Weight: Weight: 47.628 kg (105 lb) Most recent weight: Weight: 57.8 kg (127 lb 6.8 oz)      Palliative Assessment/Data:  Flowsheet Rows        Most Recent Value   Intake Tab    Referral Department  Hospitalist   Unit at Time of Referral  ICU   Palliative Care Primary Diagnosis  Sepsis/Infectious Disease   Palliative Care Type  New Palliative care   Reason for referral  Clarify Goals of Care   Date first seen by Palliative Care  03/07/16   Clinical Assessment    Palliative Performance Scale Score  20%   Pain Max last 24 hours  3   Pain Min Last 24 hours  2   Dyspnea Max Last 24 Hours  3   Dyspnea Min Last 24 hours  2   Psychosocial & Spiritual Assessment    Palliative Care Outcomes    Patient/Family meeting held?  Yes   Who was at the meeting?  wife Mrs Benally    Palliative Care Outcomes  Clarified goals of care   Palliative Care follow-up planned  No      Additional Data Reviewed:  CBC:    Component Value Date/Time   WBC 6.7 03/06/2016 0635   HGB 9.8* 03/06/2016 0635   HCT 29.1* 03/06/2016 0635   PLT 449* 03/06/2016 0635   MCV 92.4 03/06/2016 0635   NEUTROABS 20.0* 02/18/2016 0500   LYMPHSABS 1.5 02/18/2016 0500   MONOABS 0.6 02/18/2016 0500   EOSABS 0.0 02/18/2016 0500   BASOSABS 0.0 02/18/2016 0500   Comprehensive Metabolic Panel:    Component Value Date/Time   NA 145 03/07/2016 0544   K 4.3 03/07/2016 0544   CL 108 03/07/2016 0544   CO2 25 03/07/2016 0544   BUN 41* 03/07/2016 0544   CREATININE 1.47* 03/07/2016 0544   GLUCOSE 93 03/07/2016 0544   CALCIUM 8.8* 03/07/2016 0544   AST 34 03/07/2016 0544     ALT 59 03/07/2016 0544   ALKPHOS 62 03/07/2016 0544   BILITOT 0.4 03/07/2016 0544   PROT 6.6 03/07/2016 0544   ALBUMIN 2.4* 03/07/2016 0544     Time In: 10 Time Out: 11  Time Total: 60 min  Greater than 50%  of this time was spent counseling and coordinating care related to the above assessment and plan.  Signed by: Loistine Chance, MD [phone]  Loistine Chance, MD  03/07/2016, 12:39 PM  Please contact Palliative Medicine Team phone at [phone] for questions and concerns.

## 2016-03-07 NOTE — Progress Notes (Signed)
Sutures removed on day 10 per MD verbal orders. No bleeding noted.

## 2016-03-07 NOTE — Evaluation (Signed)
Clinical/Bedside Swallow Evaluation Patient Details  Name: Dale [name] MRN: [id number] Date of Birth: 11/04/1966  Today's Date: 03/07/2016 Time: SLP Start Time (ACUTE ONLY): [id number] SLP Stop Time (ACUTE ONLY): 0858 SLP Time Calculation (min) (ACUTE ONLY): 25 min  Past Medical History:  Past Medical History  Diagnosis Date  . Depression    Past Surgical History:  Past Surgical History  Procedure Laterality Date  . Ankle surgery    . Cardiac catheterization N/A 02/17/2016    Procedure: IABP Insertion;  Surgeon: Iran OuchMuhammad A Arida, MD;  Location: [facility] INVASIVE [facility] LAB;  Service: Cardiovascular;  Laterality: N/A;   HPI:  50 y.o. male with h/o depression who [facility] with URI symptoms and was d/c with Tamiflu. Family brought pt to ED 3/14 with leukocytosis, fever of 103, decreased consciousness and minimally responsive. 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 Pt intubated in ED, trach placed 3/24 due to inability to wean off vent.   Assessment / Plan / Recommendation Clinical Impression  PMSV donned during evaluation to provide increased pressure and improve swallow function. Pt exhibited poor awareness of bolus, culminating in oral holding and prolonged oral transit with L lateral sulci pocketing. Observed throat clear x1, indicative of possible penetration/aspiration. Decreased hyolaryngeal excursion noted with palpation and suspect delayed swallow initiation. Noted MD considering PEG placement. As pt is much more alert today than when previously seen (3/31), pt ready for instrumental study to r/o penetration/aspiration and determine least restrictive diet. Pt educated re: instrumental testing. SLP will perform MBS @ 930.    Aspiration Risk  Severe aspiration risk    Diet Recommendation NPO   Medication Administration: Via alternative means    Other  Recommendations Oral Care Recommendations: Oral care QID    Follow up Recommendations   (TBD)           Prognosis Prognosis for Safe Diet Advancement: Fair Barriers to Reach Goals: Time post onset;Severity of deficits      Swallow Study   General HPI: 50 y.o. male with h/o depression who [facility] with URI symptoms and was d/c with Tamiflu. Family brought pt to ED 3/14 with leukocytosis, fever of 103, decreased consciousness and minimally responsive. 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 Pt intubated in ED, trach placed 3/24 due to inability to wean off vent. Type of Study: MBS-Modified Barium Swallow Study Previous Swallow Assessment: none found Diet Prior to this Study: NPO Temperature Spikes Noted: No Respiratory Status: Trach Collar History of Recent Intubation: Yes Length of Intubations (days): 11 days Date extubated:  (s/p trach placement) Behavior/Cognition: Alert;Cooperative;Confused;Impulsive;Distractible;Requires cueing Oral Cavity Assessment: Within Functional Limits Oral Care Completed by SLP: Yes Oral Cavity - Dentition: Edentulous Vision: Functional for self-feeding Self-Feeding Abilities: Total assist Patient Positioning: Upright in bed Baseline Vocal Quality: Low vocal intensity;Hoarse Volitional Cough: Weak Volitional Swallow: Unable to elicit    Oral/Motor/Sensory Function Overall Oral Motor/Sensory Function: Within functional limits   Ice Chips Ice chips: Not tested   Thin Liquid Thin Liquid: Impaired Presentation: Cup Oral Phase Impairments: Reduced labial seal;Poor awareness of bolus Oral Phase Functional Implications: Left lateral sulci pocketing;Prolonged oral transit;Oral holding Pharyngeal  Phase Impairments: Suspected delayed Swallow;Decreased hyoid-laryngeal movement;Throat Clearing - Immediate    Nectar Thick Nectar Thick Liquid: Not tested   Honey Thick Honey Thick Liquid: Not tested   Puree Puree:  Impaired Presentation: Spoon Oral Phase  Impairments: Poor awareness of bolus Oral Phase Functional Implications: Left lateral sulci pocketing;Prolonged oral transit;Oral holding Pharyngeal Phase Impairments: Suspected delayed Swallow;Decreased hyoid-laryngeal movement   Solid   GO   Solid: Not tested        Lynita Lombard 03/07/2016,10:04 AM  Lynita Lombard, Student-SLP

## 2016-03-08 DIAGNOSIS — R1310 Dysphagia, unspecified: Secondary | ICD-10-CM

## 2016-03-08 DIAGNOSIS — Z930 Tracheostomy status: Secondary | ICD-10-CM

## 2016-03-08 LAB — COMPREHENSIVE METABOLIC PANEL
ALBUMIN: 2.2 g/dL — AB (ref 3.5–5.0)
ALT: 45 U/L (ref 17–63)
ANION GAP: 11 (ref 5–15)
AST: 29 U/L (ref 15–41)
Alkaline Phosphatase: 60 U/L (ref 38–126)
BILIRUBIN TOTAL: 0.4 mg/dL (ref 0.3–1.2)
BUN: 35 mg/dL — AB (ref 6–20)
CHLORIDE: 113 mmol/L — AB (ref 101–111)
CO2: 22 mmol/L (ref 22–32)
Calcium: 8.4 mg/dL — ABNORMAL LOW (ref 8.9–10.3)
Creatinine, Ser: 1.43 mg/dL — ABNORMAL HIGH (ref 0.61–1.24)
GFR calc Af Amer: >60 mL/min (ref >60)
GFR calc non Af Amer: 55 mL/min — ABNORMAL LOW (ref >60)
GLUCOSE: 100 mg/dL — AB (ref 65–99)
POTASSIUM: 4 mmol/L (ref 3.5–5.1)
SODIUM: 146 mmol/L — AB (ref 135–145)
TOTAL PROTEIN: 5.7 g/dL — AB (ref 6.5–8.1)

## 2016-03-08 LAB — CBC
HCT: 28.2 % — ABNORMAL LOW (ref 39.0–52.0)
HEMOGLOBIN: 9.3 g/dL — AB (ref 13.0–17.0)
MCH: 30.5 pg (ref 26.0–34.0)
MCHC: 33 g/dL (ref 30.0–36.0)
MCV: 92.5 fL (ref 78.0–100.0)
Platelets: 400 10*3/uL (ref 150–400)
RBC: 3.05 MIL/uL — AB (ref 4.22–5.81)
RDW: 14.3 % (ref 11.5–15.5)
WBC: 5.1 10*3/uL (ref 4.0–10.5)

## 2016-03-08 LAB — GLUCOSE, CAPILLARY
GLUCOSE-CAPILLARY: 84 mg/dL (ref 65–99)
GLUCOSE-CAPILLARY: 88 mg/dL (ref 65–99)
GLUCOSE-CAPILLARY: 102 mg/dL — AB (ref 65–99)
GLUCOSE-CAPILLARY: 78 mg/dL (ref 65–99)
Glucose-Capillary: 98 mg/dL (ref 65–99)
Glucose-Capillary: 104 mg/dL — ABNORMAL HIGH (ref 65–99)

## 2016-03-08 LAB — CARBOXYHEMOGLOBIN
Carboxyhemoglobin: 1.4 % (ref 0.5–1.5)
Methemoglobin: 0.8 % (ref 0.0–1.5)
O2 SAT: 64.5 %
Total hemoglobin: 9.5 g/dL — ABNORMAL LOW (ref 13.5–18.0)

## 2016-03-08 MED ORDER — DEXTROSE-NACL 5-0.2 % IV SOLN
INTRAVENOUS | Status: DC
  Administered 2016-03-09: 19:00:00 via INTRAVENOUS
  Administered 2016-03-09: 1000 mL via INTRAVENOUS
  Administered 2016-03-10: 09:00:00 via INTRAVENOUS

## 2016-03-08 MED ORDER — INSULIN ASPART 100 UNIT/ML ~~LOC~~ SOLN: 0.0000 [IU] | Freq: Three times a day (TID) | SUBCUTANEOUS | Status: DC

## 2016-03-08 NOTE — Progress Notes (Signed)
Speech Language Pathology Treatment: Dysphagia;Passy Muir Speaking valve  Patient Details Name: Dale [name] MRN: [id number] DOB: 10/14/1966 Today's Date: 03/08/2016 Time: [id number] SLP Time Calculation (min) (ACUTE ONLY): 21 min  Assessment / Plan / Recommendation Clinical Impression  Pt with excellent toleration of PMV today - all VS remained stable and within normal ranges; pt accessing upper airway for speech, with good quality phonation but very low volume and minimal spontaneous output, reduced initiation.  When given max cues, pt engaged in automatic sequences (counting to ten, naming DOW), but intelligibility was quite poor.  No attempts made to initiate communication with others.  With valve in place, demonstrated good toleration of breakfast.  Required max physical hand-over-hand assist to self-feed; able to grip spoon and bring to lips, masticate, and swallow with max cues but no overt s/s of aspiration.  Consumed entire bowl of grits, applesauce and 4 oz thickened juice with active participation.  SLP will continue to follow for communication and swallowing.    HPI HPI: 50 y.o. male with h/o depression who [facility] 3[facility] with URI symptoms and was d/c with Tamiflu. Family brought pt to ED 3/14 with leukocytosis, fever of 103, decreased consciousness and minimally responsive. 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 Pt intubated in ED, trach placed 3/24 due to inability to wean off vent.      SLP Plan  Continue with current plan of care     Recommendations  Diet recommendations: Dysphagia 1 (puree);Nectar-thick liquid Liquids provided via: Cup Medication Administration: Crushed with puree Supervision: Staff to assist with self feeding;Full supervision/cueing for compensatory strategies Compensations: Minimize environmental distractions;Slow rate;Small sips/bites Postural Changes and/or Swallow  Maneuvers: Seated upright 90 degrees      Patient may use Passy-Muir Speech Valve: During all therapies with supervision;During PO intake/meals PMSV Supervision: Full MD: Please consider changing trach tube to : Cuffless      Oral Care Recommendations: Oral care BID Plan: Continue with current plan of care     GO                Dale [name], Dale [name] 03/08/2016, 10:42 AM

## 2016-03-08 NOTE — Progress Notes (Addendum)
[facility] PROGRESS NOTE     Name: Dale [name] MRN: [id number] DOB: 1966-07-11    ADMISSION DATE:  02/16/2016   TR[facility] Transfer DATE:  03/04/16    CHIEF COMPLAINT:  AMS  SUBJECTIVE:  Tolerating dysphagia 1 diet,   VITAL SIGNS: BP 91/68 mmHg  Pulse 68  Temp(Src) 97.3 F (36.3 C) (Oral)  Resp 16  Ht 5' 8.9" (1.75 m)  Wt 58.9 kg (129 lb 13.6 oz)  BMI 19.23 kg/m2  SpO2 100%  HEMODYNAMICS: CVP:  [0 mmHg-18 mmHg] 13 mmHg  VENTILATOR SETTINGS: Vent Mode:  [-]  FiO2 (%):  [21 %-28 %] 21 %  INTAKE / OUTPUT: I/O last 3 completed shifts: In: 4024.1 [P.O.:240; I.V.:2215; Other:50; NG/GT:1019.1; IV Piggyback:500] Out: 2250 [Urine:2250]  PHYSICAL EXAMINATION: General: chronically ill appearing male, NAD on TC Neuro:  Awake, follows simple commands.  HEENT: [place]/AT, PERRL, #6 trach midline, thick  Purulent secretions per trache Cardiac: RRR, Nl S1/S2, -M/R/G. Chest: resps even non-labored,  rhonchi BLF.  Abd: Soft, NT, ND and +BS. Ext: Intact, warm/dry   LABS:  PULMONARY  Recent Labs Lab 03/04/16 0438 03/05/16 0614 03/06/16 0630 03/07/16 0423 03/08/16 0525  O2SAT 77.8 87.0 78.0 85.3 64.5    CBC  Recent Labs Lab 03/06/16 0345 03/06/16 0635 03/08/16 0526  HGB 5.0* 9.8* 9.3*  HCT 15.7* 29.1* 28.2*  WBC 4.0 6.7 5.1  PLT 249 449* 400   COAGULATION No results for input(s): INR in the last 168 hours. CARDIAC No results for input(s): TROPONINI in the last 168 hours. No results for input(s): PROBNP in the last 168 hours.  CHEMISTRY  Recent Labs Lab 03/02/16 0320 03/03/16 0448 03/04/16 0430 03/05/16 0520 03/06/16 0635 03/07/16 0544 03/08/16 0526  NA 142 148* 145 144 144 145 146*  K 4.1 4.2 4.0 4.1 4.2 4.3 4.0  CL 104 105 106 109 108 108 113*  CO2 29 31 26 23 24 25 22   GLUCOSE 111* 107* 214* 108* 100* 93 100*  BUN 30* 38* 44* 46* 42* 41* 35*  CREATININE 1.18 1.50* 1.81* 1.88* 1.65* 1.47* 1.43*  CALCIUM 8.8* 9.1 8.2* 8.7* 8.9 8.8* 8.4*  MG 2.6* 2.2 2.2  2.4 2.4  --   --   PHOS 5.4* 6.5* 6.0* 5.0* 4.8*  --   --    Estimated Creatinine Clearance: 50.9 mL/min (by C-G formula based on Cr of 1.43).  LIVER  Recent Labs Lab 03/05/16 0520 03/06/16 0635 03/07/16 0544 03/08/16 0526  AST 85* 42* 34 29  ALT 101* 72* 59 45  ALKPHOS 66 65 62 60  BILITOT 0.5 0.3 0.4 0.4  PROT 6.9 6.8 6.6 5.7*  ALBUMIN 2.4* 2.3* 2.4* 2.2*   INFECTIOUS No results for input(s): LATICACIDVEN, PROCALCITON in the last 168 hours.   ENDOCRINE CBG (last 3)   Recent Labs  03/08/16 0528 03/08/16 0749 03/08/16 1214  GLUCAP 104* 84 88   IMAGING x48h Dg Swallowing Func-speech Pathology  03/07/2016  Objective Swallowing Evaluation: Type of Study: MBS-Modified Barium Swallow Study Patient Details Name: Dale [name] MRN: [id number] Date of Birth: 02-24-1966 Today's Date: 03/07/2016 Time: SLP Start Time (ACUTE ONLY): 0928-SLP Stop Time (ACUTE ONLY): 7829 SLP Time Calculation (min) (ACUTE ONLY): 24 min Past Medical History: Past Medical History Diagnosis Date . Depression  Past Surgical History: Past Surgical History Procedure Laterality Date . Ankle surgery   . Cardiac catheterization N/A 02/17/2016   Procedure: IABP Insertion;  Surgeon: Iran Ouch, MD;  Location: [facility] INVASIVE CV LAB;  Service:  Cardiovascular;  Laterality: N/A; HPI: 50 y.o. male with h/o depression who initially presented to [facility] ED 3/11 with URI symptoms and was d/c with Tamiflu. Family brought pt to ED 3/14 with leukocytosis, fever of 103, decreased consciousness and minimally responsive. 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 Pt intubated in ED, trach placed 3/24 due to inability to wean off vent. No Data Recorded Assessment / Plan / Recommendation CHL IP CLINICAL IMPRESSIONS 03/07/2016 Therapy Diagnosis Mild oral phase dysphagia;Mild pharyngeal phase dysphagia Clinical Impression MBS complete with PMSV donned throughout assessment. Pt exhibited mild  oropharyngeal dysphagia during this study due to sensory deficits. Weak lingual manipulation resulted in decreased bolus cohesion. Delayed swallow initiation to the vallecula and pyriform sinuses observed, resulting in silent penetration above the vocal cords x1 with intake of thin liquids. No compensatory strategies attempted due to altered mental status and inability to comply. No airway compromise noted for all other consistencies and no pharyngeal residue observed. Due to pt's decreased mental status, recommend Dysphagia 1 (puree) textures to prevent pocketing with nectar thick liquids (no straws) and meds crushed in puree, with PMSV donned. Pt and wife educated re: diet recommendation and PMSV use/precautions. SLP will f/u to determine diet tolerance and advise diet tolerance. Impact on safety and function (No Data)   CHL IP TREATMENT RECOMMENDATION 03/07/2016 Treatment Recommendations Therapy as outlined in treatment plan below   Prognosis 03/07/2016 Prognosis for Safe Diet Advancement Good Barriers to Reach Goals Severity of deficits;Time post onset Barriers/Prognosis Comment -- CHL IP DIET RECOMMENDATION 03/07/2016 SLP Diet Recommendations Dysphagia 1 (Puree) solids;Nectar thick liquid Liquid Administration via Cup;No straw Medication Administration Crushed with puree Compensations Minimize environmental distractions;Slow rate;Small sips/bites Postural Changes Seated upright at 90 degrees   CHL IP OTHER RECOMMENDATIONS 03/07/2016 Recommended Consults -- Oral Care Recommendations Oral care BID Other Recommendations Order thickener from pharmacy;Prohibited food (jello, ice cream, thin soups);Remove water pitcher;Have oral suction available;Place PMSV during PO intake   CHL IP FOLLOW UP RECOMMENDATIONS 03/07/2016 Follow up Recommendations (No Data)   CHL IP FREQUENCY AND DURATION 03/07/2016 Speech Therapy Frequency (ACUTE ONLY) min 2x/week Treatment Duration 2 weeks      CHL IP ORAL PHASE 03/07/2016 Oral Phase Impaired Oral  - Pudding Teaspoon -- Oral - Pudding Cup -- Oral - Honey Teaspoon -- Oral - Honey Cup -- Oral - Nectar Teaspoon -- Oral - Nectar Cup Decreased bolus cohesion;Weak lingual manipulation Oral - Nectar Straw (No Data) Oral - Thin Teaspoon -- Oral - Thin Cup Decreased bolus cohesion;Weak lingual manipulation Oral - Thin Straw -- Oral - Puree Decreased bolus cohesion;Weak lingual manipulation Oral - Mech Soft -- Oral - Regular -- Oral - Multi-Consistency -- Oral - Pill -- Oral Phase - Comment --  CHL IP PHARYNGEAL PHASE 03/07/2016 Pharyngeal Phase Impaired Pharyngeal- Pudding Teaspoon -- Pharyngeal -- Pharyngeal- Pudding Cup -- Pharyngeal -- Pharyngeal- Honey Teaspoon -- Pharyngeal -- Pharyngeal- Honey Cup -- Pharyngeal -- Pharyngeal- Nectar Teaspoon -- Pharyngeal -- Pharyngeal- Nectar Cup Delayed swallow initiation-pyriform sinuses;Penetration/Aspiration during swallow Pharyngeal Material enters airway, remains ABOVE vocal cords and not ejected out Pharyngeal- Nectar Straw (No Data) Pharyngeal -- Pharyngeal- Thin Teaspoon -- Pharyngeal -- Pharyngeal- Thin Cup Penetration/Aspiration during swallow;Delayed swallow initiation-pyriform sinuses Pharyngeal Material enters airway, remains ABOVE vocal cords and not ejected out Pharyngeal- Thin Straw -- Pharyngeal -- Pharyngeal- Puree Delayed swallow initiation-vallecula Pharyngeal -- Pharyngeal- Mechanical Soft -- Pharyngeal -- Pharyngeal- Regular -- Pharyngeal -- Pharyngeal- Multi-consistency -- Pharyngeal -- Pharyngeal- Pill --  Pharyngeal -- Pharyngeal Comment --  CHL IP CERVICAL ESOPHAGEAL PHASE 03/07/2016 Cervical Esophageal Phase WFL Pudding Teaspoon -- Pudding Cup -- Honey Teaspoon -- Honey Cup -- Nectar Teaspoon -- Nectar Cup -- Nectar Straw -- Thin Teaspoon -- Thin Cup -- Thin Straw -- Puree -- Mechanical Soft -- Regular -- Multi-consistency -- Pill -- Cervical Esophageal Comment -- No flowsheet data found. Royce Macadamia 03/07/2016, 11:17 AM Breck Coons Lonell Face.Ed  CCC-SLP Pager [phone]              STUDIES:  3/14 CT head > multifocal sinusitis, enlarged ventricles 3/14 LP >> glucose < 20, protein > 600, WBC 340 (88%N), 89 RBC  CULTURES: 3/14 Blood >> strep pneumo 3/14 Urine >> neg 3/14 CSF >> strep pneumo  ANTIBIOTICS: Anti-infectives    Start     Dose/Rate Route Frequency Ordered Stop   02/22/16 0930  penicillin g benzathine (BICILLIN LA) 1200000 UNIT/2ML injection 2.4 Million Units     2.4 Million Units Intramuscular Weekly 02/22/16 0836 03/07/16 1313   02/17/16 1430  vancomycin (VANCOCIN) IVPB 750 mg/150 ml premix  Status:  Discontinued     750 mg 150 mL/hr over 60 Minutes Intravenous Every 24 hours 02/16/16 1447 02/17/16 0805   02/17/16 1200  cefTRIAXone (ROCEPHIN) 1 g in dextrose 5 % 50 mL IVPB  Status:  Discontinued     1 g 100 mL/hr over 30 Minutes Intravenous Every 24 hours 02/16/16 1143 02/16/16 1144   02/17/16 1200  cefTRIAXone (ROCEPHIN) 2 g in dextrose 5 % 50 mL IVPB  Status:  Discontinued     2 g 100 mL/hr over 30 Minutes Intravenous Every 24 hours 02/16/16 1144 02/16/16 1447   02/17/16 1200  ampicillin (OMNIPEN) 2 g in sodium chloride 0.9 % 50 mL IVPB  Status:  Discontinued     2 g 150 mL/hr over 20 Minutes Intravenous 4 times per day 02/17/16 0803 02/17/16 0812   02/17/16 0830  vancomycin (VANCOCIN) 500 mg in sodium chloride 0.9 % 100 mL IVPB  Status:  Discontinued     500 mg 100 mL/hr over 60 Minutes Intravenous Every 12 hours 02/17/16 0805 02/19/16 1407   02/17/16 0000  cefTRIAXone (ROCEPHIN) 2 g in dextrose 5 % 50 mL IVPB     2 g 100 mL/hr over 30 Minutes Intravenous Every 12 hours 02/16/16 1447 02/29/16 1338   02/16/16 1500  vancomycin (VANCOCIN) IVPB 1000 mg/200 mL premix     1,000 mg 200 mL/hr over 60 Minutes Intravenous  Once 02/16/16 1447 02/16/16 2122   02/16/16 1500  ampicillin (OMNIPEN) 2 g in sodium chloride 0.9 % 50 mL IVPB  Status:  Discontinued     2 g 150 mL/hr over 20 Minutes Intravenous 3 times per day  02/16/16 1447 02/17/16 0803   02/16/16 1145  cefTRIAXone (ROCEPHIN) 2 g in dextrose 5 % 50 mL IVPB  Status:  Discontinued     2 g 100 mL/hr over 30 Minutes Intravenous  Once 02/16/16 1130 02/16/16 1136   02/16/16 1145  cefTRIAXone (ROCEPHIN) 1 g in dextrose 5 % 50 mL IVPB  Status:  Discontinued     1 g 100 mL/hr over 30 Minutes Intravenous Every 24 hours 02/16/16 1136 02/16/16 1143   02/16/16 1145  cefTRIAXone (ROCEPHIN) 2 g in dextrose 5 % 50 mL IVPB     2 g 100 mL/hr over 30 Minutes Intravenous  Once 02/16/16 1143 02/16/16 1711       LINES/TUBES: 3/14 ETT >> 3/24  3/15 Rt femoral CVL >> 3/24 3/15 Rt femoral aline >> out 3/15 Rt IJ introducer >> out  3/24 RUE PICC >>   SIGNIFICANT EVENTS: 3/14  Admit, VDRF, ID/cardiology/neurology consulted 3/15  Cardiac arrest, 5 mins, Asystole > VF > ST; ARDS, septic/cardiogenic shock >> IABP; start paralytic 3/16  Remains on paralytic, pressors, inotropes. 3/18  IABP to be removed 3/18  Nimbex discontinued  3/24  Bedside trach    Brief summary 50 y/o reportedly homeless male, smoker, with PMH of ETOH abuse admitted 3/14 for worsening upper respiratory symptoms.  Developed progressive confusion, lactic acidosis, AKI. 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 Suffered cardiac arrest, cardiogenic shock with acute systolic CHF, ARDS 3/15.  Trach 3/24.    ASSESSMENT / PLAN: S/P Acute respiratory failure 2nd to ARDS, acute pulmonary edema S/P Tracheostomy - 3/24 might be ready for decannulation soon Former Tobacco Abuse Pleural effusion on cxr 3/27 Daily trach collar >> off vent since 3/30, continues to have purulent secretions from the trach,once with less secretions, improved FiO2 requirements Last chest x-ray 4/1 shows improved bibasilar opacities consistent with improving pulmonary edema infiltrate/edema,  PRN albuterol , incentive spirometry Lasix discontinued because of worsening kidney function Strict aspiration precautions, might be ready  for decannulation soon as per pulmonary   Dysphagia. - D1 diet - f/u with speech therapy - d/c'd  NG tube and tube feedings 4/04 No indication for PEG at this time   S/P Septic/cardiogenic shock - resolved, in setting of streptococcal bacteremia and meningitis Acute systolic CHF  CHF: EF 20-25% on 3/16 echo. Repeat ECHO with EF 50-55%.  Cardiac arrest 3/15 CXR with B pleural effusion -- worse (3/27) NSTEMI Cardiology following, last seen on 3/24 by the heart failure team Continue amiodarone, coreg, held bidil &   spironolactone [worsening renal function]:/Hypotension Lasix on hold because of worsening renal function      AKI 2nd to ATN. Creatinine on the rise, over diuresis? 1.5>1.81>1.88 Hypokalemia-repleted Hypernatremia-continue half normal saline given worsening renal function, change half normal saline to D51/4 ns given rising sodium Closely follow renal function, sodium Lasix on hold     Nutrition Shock liver - AST/ALT slightly higher, resolved Protonix Speech therapy consultation,PMSV under supervision of speech therapy Currently on dysphagia 1 diet with nectar thick liquids    Anemia  Leukocytosis - resolved  F/u CBC Heparin for DVT prophylaxis     Streptococcal bacteremia and meningitis RPR positive Day 14 ceftriaxone, decadron per ID  >> dcd on 3/27 Pen G 2.4 million units IM weekly until 4/10 No acitive infxn for now   Hypoglycemia Relative adrenal insufficiency >> cortisol 15.2 from 3/15 SSI with CBG Q4, continue D5 half normal saline to prevent hypoglycemia Cont insulin coverage.  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 Status post neurology consultation 3/14 Initial neurology consultation on 3/14 recommended MRI whenever able to do so EEG showed burst suppression pattern likely from a combination of fentanyl, in addition to severe encephalopathy. No seizures noted on the  EEG.  Overall, poor prognosis for neurological recovery. Therefore palliative care consultation requested   Hx of ETOH, THC Streptococcal meningitis  Thiamine, folic acid Off sedation Anca neg, ana pos, ds-dna neg results, speckled pattern noticed.    Disposition: . Continue SDU. discharged delayed due to ongoing medical issues as above, He needs placement but awating medicaid.  . As per neurology overall prognosis for neurological recovery is poor ( neurology notes on  3/15)  Discussed with patient's wife   Richarda OverlieNAYANA Arden Tinoco , MD 03/08/2016, 2:09 PM  Pager [phone]

## 2016-03-08 NOTE — Evaluation (Signed)
Physical Therapy Evaluation Patient Details Name: Dale [name] MRN: [id number] DOB: 03/06/1966 Today's Date: 03/08/2016   History of Present Illness  50 y/o reportedly homeless male, smoker, with PMH of ETOH abuse admitted 3/14 for worsening upper respiratory symptoms. Developed progressive confusion, lactic acidosis, AKI. 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 Suffered cardiac arrest, cardiogenic shock with acute systolic CHF, ARDS 3/15. Trach 3/24.    Clinical Impression  Pt admitted with above diagnosis. Pt currently with functional limitations due to the deficits listed below (see PT Problem List). Pt able to participate in therapy by following some simple commands. Pt is very weak and deconditioned and will benefit from skilled PT to increase their independence and safety with mobility to allow discharge to the venue listed below.      Follow Up Recommendations SNF;Supervision/Assistance - 24 hour    Equipment Recommendations  Other (comment) (TBD)    Recommendations for Other Services       Precautions / Restrictions Precautions Precautions: Fall Precaution Comments: trach, feeding tube, A line Restrictions Weight Bearing Restrictions: No      Mobility  Bed Mobility Overal bed mobility: Needs Assistance;+ 2 for safety/equipment Bed Mobility: Rolling Rolling: Mod assist;+2 for safety/equipment         General bed mobility comments: Mod A to roll, pt needs heavy verbal and tactile cues. Rolled several times as pt was saturated in urine upon entry to room. Rolled to clean him up and replace sheets and untangle lines/leads.   Transfers                    Ambulation/Gait                Stairs            Wheelchair Mobility    Modified Rankin (Stroke Patients Only)       Balance                                             Pertinent Vitals/Pain Pain Assessment: Faces Faces Pain Scale: Hurts little more Pain Location:  unspecified Pain Descriptors / Indicators: Discomfort;Other (Comment) (Pt squirming in bed frequently. ) Pain Intervention(s): Repositioned  Vitals stable on 5L 21% Trach collar.     Home Living Family/patient expects to be discharged to:: Skilled nursing facility                 Additional Comments: Pt is homeless according to chart. No family present to provide history or PLOF    Prior Function Level of Independence: Independent         Comments: PLOF is unknown, no family present.      Hand Dominance        Extremity/Trunk Assessment   Upper Extremity Assessment: Defer to OT evaluation           Lower Extremity Assessment: RLE deficits/detail;LLE deficits/detail RLE Deficits / Details: Grossly 3-/5 LLE Deficits / Details: Grossly 3-/5  Cervical / Trunk Assessment: Kyphotic  Communication   Communication: Expressive difficulties;Tracheostomy  Cognition Arousal/Alertness: Awake/alert Behavior During Therapy: Flat affect Overall Cognitive Status: Difficult to assess Area of Impairment: Following commands;Safety/judgement;Awareness       Following Commands: Follows one step commands inconsistently   Awareness: Intellectual   General Comments: Pt inconsistently following commands. Able to follow some of his commands with his hands.     General  Comments General comments (skin integrity, edema, etc.): Pt saturated in urine upon entry to room. Condom cath was off, RN notified and PT replaced.     Exercises        Assessment/Plan    PT Assessment Patient needs continued PT services  PT Diagnosis Difficulty walking;Generalized weakness;Altered mental status   PT Problem List Decreased strength;Decreased range of motion;Decreased activity tolerance;Decreased balance;Decreased mobility;Decreased coordination;Decreased cognition;Decreased knowledge of use of DME;Decreased safety awareness;Cardiopulmonary status limiting activity  PT Treatment  Interventions DME instruction;Gait training;Stair training;Functional mobility training;Therapeutic activities;Therapeutic exercise;Balance training;Patient/family education   PT Goals (Current goals can be found in the Care Plan section) Acute Rehab PT Goals Patient Stated Goal: none stated PT Goal Formulation: Patient unable to participate in goal setting Time For Goal Achievement: 03/22/16 Potential to Achieve Goals: Fair    Frequency Min 3X/week   Barriers to discharge Inaccessible home environment;Decreased caregiver support Pt is homeless and only has an "ex-wife". unsure of her availablitly and willingness to take care of him.     Co-evaluation               End of Session Equipment Utilized During Treatment: Oxygen Activity Tolerance: Patient tolerated treatment well Patient left: in bed;with call bell/phone within reach;with bed alarm set Nurse Communication: Mobility status         Time: 1191-4782 PT Time Calculation (min) (ACUTE ONLY): 32 min   Charges:   PT Evaluation $PT Eval Moderate Complexity: 1 Procedure     PT G Codes:       Everlean Cherry, SPT Everlean Cherry 03/08/2016, 10:38 AM

## 2016-03-08 NOTE — Progress Notes (Signed)
PULMONARY / CRITICAL CARE MEDICINE   Name: Dale [name] MRN: [id number] DOB: Jul 29, 1966    ADMISSION DATE:  02/16/2016   CONSULTATION DATE:  02/16/16  REFERRING MD:  Rito Ehrlich  CHIEF COMPLAINT:  AMS  SUBJECTIVE:   Did well with swallowing.  Tolerating diet.  VITAL SIGNS: BP 101/56 mmHg  Pulse 73  Temp(Src) 97.7 F (36.5 C) (Oral)  Resp 21  Ht 5' 8.9" (1.75 m)  Wt 129 lb 13.6 oz (58.9 kg)  BMI 19.23 kg/m2  SpO2 99%  INTAKE / OUTPUT: I/O last 3 completed shifts: In: 3884.1 [P.O.:240; I.V.:2140; Other:50; NG/GT:954.1; IV Piggyback:500] Out: 2250 [Urine:2250]  PHYSICAL EXAMINATION: General: no distress Neuro: follows commands HEENT: trach site clean Cardiac: regular, no murmur Chest: no wheeze Abd: soft, non tender Ext: no edema Skin: no rashes   LABS:  CBC  Recent Labs Lab 03/06/16 0345 03/06/16 0635 03/08/16 0526  HGB 5.0* 9.8* 9.3*  HCT 15.7* 29.1* 28.2*  WBC 4.0 6.7 5.1  PLT 249 449* 400   CHEMISTRY  Recent Labs Lab 03/02/16 0320 03/03/16 0448 03/04/16 0430 03/05/16 0520 03/06/16 0635 03/07/16 0544 03/08/16 0526  NA 142 148* 145 144 144 145 146*  K 4.1 4.2 4.0 4.1 4.2 4.3 4.0  CL 104 105 106 109 108 108 113*  CO2 GLUCOSE 111* 107* 214* 108* 100* 93 100*  BUN 30* 38* 44* 46* 42* 41* 35*  CREATININE 1.18 1.50* 1.81* 1.88* 1.65* 1.47* 1.43*  CALCIUM 8.8* 9.1 8.2* 8.7* 8.9 8.8* 8.4*  MG 2.6* 2.2 2.2 2.4 2.4  --   --   PHOS 5.4* 6.5* 6.0* 5.0* 4.8*  --   --    Estimated Creatinine Clearance: 50.9 mL/min (by C-G formula based on Cr of 1.43).  LIVER  Recent Labs Lab 03/05/16 0520 03/06/16 0635 03/07/16 0544 03/08/16 0526  AST 85* 42* 34 29  ALT 101* 72* 59 45  ALKPHOS 66 65 62 60  BILITOT 0.5 0.3 0.4 0.4  PROT 6.9 6.8 6.6 5.7*  ALBUMIN 2.4* 2.3* 2.4* 2.2*    ENDOCRINE CBG (last 3)   Recent Labs  03/08/16 0102 03/08/16 0528 03/08/16 0749  GLUCAP 98 104* 84   IMAGING x48h Dg Swallowing Func-speech  Pathology  03/07/2016  Objective Swallowing Evaluation: Type of Study: MBS-Modified Barium Swallow Study Patient Details Name: Dale [name] MRN: [id number] Date of Birth: 30-Dec-1965 Today's Date: 03/07/2016 Time: SLP Start Time (ACUTE ONLY): 0928-SLP Stop Time (ACUTE ONLY): 7829 SLP Time Calculation (min) (ACUTE ONLY): 24 min Past Medical History: Past Medical History Diagnosis Date . Depression  Past Surgical History: Past Surgical History Procedure Laterality Date . Ankle surgery   . Cardiac catheterization N/A 02/17/2016   Procedure: IABP Insertion;  Surgeon: Iran Ouch, MD;  Location: [facility] INVASIVE CV LAB;  Service: Cardiovascular;  Laterality: N/A; HPI: 50 y.o. male with h/o depression who initially presented [facility] ED 3/11 with URI symptoms and was d/c with Tamiflu. Family brought pt to ED 3/14 with leukocytosis, fever of 103, decreased consciousness and minimally responsive. 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 Pt intubated in ED, trach placed 3/24 due to inability to wean off vent. No Data Recorded Assessment / Plan / Recommendation CHL IP CLINICAL IMPRESSIONS 03/07/2016 Therapy Diagnosis Mild oral phase dysphagia;Mild pharyngeal phase dysphagia Clinical Impression MBS complete with PMSV donned throughout assessment. Pt exhibited mild oropharyngeal dysphagia during this study due to sensory deficits.  Weak lingual manipulation resulted in decreased bolus cohesion. Delayed swallow initiation to the vallecula and pyriform sinuses observed, resulting in silent penetration above the vocal cords x1 with intake of thin liquids. No compensatory strategies attempted due to altered mental status and inability to comply. No airway compromise noted for all other consistencies and no pharyngeal residue observed. Due to pt's decreased mental status, recommend Dysphagia 1 (puree) textures to prevent pocketing with nectar thick liquids (no straws) and meds  crushed in puree, with PMSV donned. Pt and wife educated re: diet recommendation and PMSV use/precautions. SLP will f/u to determine diet tolerance and advise diet tolerance. Impact on safety and function (No Data)   CHL IP TREATMENT RECOMMENDATION 03/07/2016 Treatment Recommendations Therapy as outlined in treatment plan below   Prognosis 03/07/2016 Prognosis for Safe Diet Advancement Good Barriers to Reach Goals Severity of deficits;Time post onset Barriers/Prognosis Comment -- CHL IP DIET RECOMMENDATION 03/07/2016 SLP Diet Recommendations Dysphagia 1 (Puree) solids;Nectar thick liquid Liquid Administration via Cup;No straw Medication Administration Crushed with puree Compensations Minimize environmental distractions;Slow rate;Small sips/bites Postural Changes Seated upright at 90 degrees   CHL IP OTHER RECOMMENDATIONS 03/07/2016 Recommended Consults -- Oral Care Recommendations Oral care BID Other Recommendations Order thickener from pharmacy;Prohibited food (jello, ice cream, thin soups);Remove water pitcher;Have oral suction available;Place PMSV during PO intake   CHL IP FOLLOW UP RECOMMENDATIONS 03/07/2016 Follow up Recommendations (No Data)   CHL IP FREQUENCY AND DURATION 03/07/2016 Speech Therapy Frequency (ACUTE ONLY) min 2x/week Treatment Duration 2 weeks      CHL IP ORAL PHASE 03/07/2016 Oral Phase Impaired Oral - Pudding Teaspoon -- Oral - Pudding Cup -- Oral - Honey Teaspoon -- Oral - Honey Cup -- Oral - Nectar Teaspoon -- Oral - Nectar Cup Decreased bolus cohesion;Weak lingual manipulation Oral - Nectar Straw (No Data) Oral - Thin Teaspoon -- Oral - Thin Cup Decreased bolus cohesion;Weak lingual manipulation Oral - Thin Straw -- Oral - Puree Decreased bolus cohesion;Weak lingual manipulation Oral - Mech Soft -- Oral - Regular -- Oral - Multi-Consistency -- Oral - Pill -- Oral Phase - Comment --  CHL IP PHARYNGEAL PHASE 03/07/2016 Pharyngeal Phase Impaired Pharyngeal- Pudding Teaspoon -- Pharyngeal -- Pharyngeal-  Pudding Cup -- Pharyngeal -- Pharyngeal- Honey Teaspoon -- Pharyngeal -- Pharyngeal- Honey Cup -- Pharyngeal -- Pharyngeal- Nectar Teaspoon -- Pharyngeal -- Pharyngeal- Nectar Cup Delayed swallow initiation-pyriform sinuses;Penetration/Aspiration during swallow Pharyngeal Material enters airway, remains ABOVE vocal cords and not ejected out Pharyngeal- Nectar Straw (No Data) Pharyngeal -- Pharyngeal- Thin Teaspoon -- Pharyngeal -- Pharyngeal- Thin Cup Penetration/Aspiration during swallow;Delayed swallow initiation-pyriform sinuses Pharyngeal Material enters airway, remains ABOVE vocal cords and not ejected out Pharyngeal- Thin Straw -- Pharyngeal -- Pharyngeal- Puree Delayed swallow initiation-vallecula Pharyngeal -- Pharyngeal- Mechanical Soft -- Pharyngeal -- Pharyngeal- Regular -- Pharyngeal -- Pharyngeal- Multi-consistency -- Pharyngeal -- Pharyngeal- Pill -- Pharyngeal -- Pharyngeal Comment --  CHL IP CERVICAL ESOPHAGEAL PHASE 03/07/2016 Cervical Esophageal Phase WFL Pudding Teaspoon -- Pudding Cup -- Honey Teaspoon -- Honey Cup -- Nectar Teaspoon -- Nectar Cup -- Nectar Straw -- Thin Teaspoon -- Thin Cup -- Thin Straw -- Puree -- Mechanical Soft -- Regular -- Multi-consistency -- Pill -- Cervical Esophageal Comment -- No flowsheet data found. Royce Macadamia 03/07/2016, 11:17 AM Breck Coons Lonell Face.Ed CCC-SLP Pager [phone]              STUDIES:  3/14 CT head > multifocal sinusitis, enlarged ventricles 3/14 LP >> glucose < 20, protein > 600, WBC 340 (  88%N), 89 RBC  CULTURES: 3/14 Blood >> strep pneumo 3/14 Urine >> negative 3/14 CSF >> strep pneumo  LINES/TUBES: 3/14 ETT >> 3/24 3/24 RUE PICC >>  3/24 Trach Ninetta Lights(JY) >>   SIGNIFICANT EVENTS: 3/14  Admit, VDRF, ID/cardiology/neurology consulted 3/15  Cardiac arrest, 5 mins, Asystole > VF > ST; ARDS, septic/cardiogenic shock >> IABP; start paralytic 3/16  Remains on paralytic, pressors, inotropes. 3/18  IABP to be removed 3/18  Nimbex  discontinued  3/24  Bedside trach >> #6  DISCUSSION: 50 y/o reportedly homeless male, smoker, with PMH of ETOH abuse admitted 3/14 for worsening upper respiratory symptoms.  Developed progressive confusion, lactic acidosis, AKI. 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 Suffered cardiac arrest, cardiogenic shock with acute systolic CHF, ARDS 3/15.  Trach 3/24.  ASSESSMENT / PLAN:  Acute on chronic respiratory failure 2nd to ARDS, acute pulmonary edema. Failure to wean from vent s/p S/P tracheostomy. Hx of tobacco abuse. Pleural effusion on cxr 3/27 - might be ready for decannulation soon  Dysphagia. - D1 diet - f/u with speech therapy - d/c NG tube and tube feedings 4/04 - I don't think he needs PEG at this time  Streptococcal bacteremia and meningitis. - Weekly IM penicillin G until 4/10  Deconditioning. - PT   Coralyn HellingVineet Cailynn Bodnar, MD [facility] 03/08/2016, 11:08 AM Pager:  [phone] After 3pm call: [phone]

## 2016-03-08 NOTE — Evaluation (Signed)
Occupational Therapy Evaluation Patient Details Name: Dale [name] MRN: [id number] DOB: 12/23/1965 Today's Date: 03/08/2016    History of Present Illness 50 y/o reportedly homeless male, smoker, with PMH of ETOH abuse admitted 3/14 for worsening upper respiratory symptoms. Developed progressive confusion, lactic acidosis, AKI. 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 Suffered cardiac arrest, cardiogenic shock with acute systolic CHF, ARDS 3/15. Trach 3/24.   Clinical Impression   PT admitted with cardiac arrest, CHF ARDS and bacterial meningitis. Pt currently with functional limitiations due to the deficits listed below (see OT problem list).  PTA independent with adls and homeless. Pt will benefit from skilled OT to increase their independence and safety with adls and balance to allow discharge SNF.     Follow Up Recommendations  SNF;Supervision/Assistance - 24 hour    Equipment Recommendations  Hospital bed;Wheelchair cushion (measurements OT);Wheelchair (measurements OT);3 in 1 bedside comode    Recommendations for Other Services       Precautions / Restrictions Precautions Precautions: Fall Precaution Comments: trach, feeding tube, A line Restrictions Weight Bearing Restrictions: No      Mobility Bed Mobility Overal bed mobility: Needs Assistance;+ 2 for safety/equipment Bed Mobility: Rolling Rolling: Mod assist;+2 for safety/equipment         General bed mobility comments: Mod A to roll, pt needs heavy verbal and tactile cues. Rolled several times as pt was saturated in urine upon entry to room. Rolled to clean him up and replace sheets and untangle lines/leads.   Transfers                 General transfer comment: not appropritate at ths time    Balance                                            ADL Overall ADL's : Needs assistance/impaired                                       General ADL Comments: Pt currently  total (A) for all adls. pt requires hand over hand for self feeding. pt swallowing small bites and drinking from a cup at this time per SLP recommendations. pt reaching aimlessly around bed and tray for objects without clear defined purposed. Pt mouthing at patient during session but uncertain due to trach what patient attempting to say.      Vision Additional Comments: to be further assessed during functional task. Pt making eye contact with therapist during feeding but inconsistently responding to name call   Perception     Praxis      Pertinent Vitals/Pain Pain Assessment: Faces Faces Pain Scale: Hurts little more Pain Location: unspecified Pain Descriptors / Indicators: Grimacing Pain Intervention(s): Monitored during session;Premedicated before session;Repositioned     Hand Dominance  (uncertain)   Extremity/Trunk Assessment Upper Extremity Assessment Upper Extremity Assessment: RUE deficits/detail;LUE deficits/detail RUE Deficits / Details: decr grip strength, decr shoulder flexion LUE Deficits / Details: decr grip strength decr shoulder flexion   Lower Extremity Assessment Lower Extremity Assessment: Defer to PT evaluation RLE Deficits / Details: Grossly 3-/5 RLE Coordination: decreased gross motor LLE Deficits / Details: Grossly 3-/5 LLE Coordination: decreased gross motor   Cervical / Trunk Assessment Cervical / Trunk Assessment: Kyphotic   Communication Communication Communication: Expressive difficulties;Tracheostomy   Cognition Arousal/Alertness:  Awake/alert Behavior During Therapy: Flat affect Overall Cognitive Status: Difficult to assess Area of Impairment: Following commands;Safety/judgement       Following Commands: Follows one step commands inconsistently   Awareness: Intellectual   General Comments: pt incontinent of bladder on arrival with condom cath d/c. pt followed commands to show 2 fingers, thumbs up and reach for therapist hands. pt  attempting to wash face with decr AROM of R UE. pt with decr grasp on wash cloth   General Comments       Exercises       Shoulder Instructions      Home Living Family/patient expects to be discharged to:: Skilled nursing facility                                 Additional Comments: Pt is homeless according to chart. No family present to provide history or PLOF      Prior Functioning/Environment Level of Independence: Independent        Comments: PLOF is unknown, no family present.     OT Diagnosis: Generalized weakness;Cognitive deficits   OT Problem List: Decreased strength;Decreased activity tolerance;Impaired balance (sitting and/or standing);Decreased range of motion;Decreased safety awareness;Decreased knowledge of use of DME or AE;Decreased knowledge of precautions;Cardiopulmonary status limiting activity;Impaired UE functional use;Decreased cognition   OT Treatment/Interventions: Self-care/ADL training;Therapeutic exercise;Neuromuscular education;DME and/or AE instruction;Therapeutic activities;Cognitive remediation/compensation;Patient/family education;Balance training    OT Goals(Current goals can be found in the care plan section) Acute Rehab OT Goals Patient Stated Goal: none stated OT Goal Formulation: Patient unable to participate in goal setting Time For Goal Achievement: 03/22/16 Potential to Achieve Goals: Good  OT Frequency: Min 2X/week   Barriers to D/C: Decreased caregiver support  homeless per chart       Co-evaluation PT/OT/SLP Co-Evaluation/Treatment: Yes Reason for Co-Treatment: Complexity of the patient's impairments (multi-system involvement);For patient/therapist safety   OT goals addressed during session: ADL's and self-care;Strengthening/ROM      End of Session Equipment Utilized During Treatment: Oxygen Nurse Communication: Mobility status;Precautions  Activity Tolerance: Patient tolerated treatment well Patient left:  in bed;with call bell/phone within reach;with bed alarm set;with nursing/sitter in room   Time: 1610-9604 OT Time Calculation (min): 35 min Charges:  OT General Charges $OT Visit: 1 Procedure OT Evaluation $OT Eval High Complexity: 1 Procedure G-Codes:    Boone Master B Mar 18, 2016, 10:48 AM  Mateo Flow   OTR/L Pager: [phone] Office: [phone] .

## 2016-03-09 DIAGNOSIS — Z930 Tracheostomy status: Secondary | ICD-10-CM | POA: Insufficient documentation

## 2016-03-09 LAB — COMPREHENSIVE METABOLIC PANEL
ALT: 45 U/L (ref 17–63)
AST: 27 U/L (ref 15–41)
Albumin: 2.4 g/dL — ABNORMAL LOW (ref 3.5–5.0)
Alkaline Phosphatase: 59 U/L (ref 38–126)
Anion gap: 12 (ref 5–15)
BUN: 31 mg/dL — ABNORMAL HIGH (ref 6–20)
CALCIUM: 9.1 mg/dL (ref 8.9–10.3)
CHLORIDE: 110 mmol/L (ref 101–111)
CO2: 25 mmol/L (ref 22–32)
CREATININE: 1.37 mg/dL — AB (ref 0.61–1.24)
GFR, EST NON AFRICAN AMERICAN: 58 mL/min — AB (ref >60)
Glucose, Bld: 96 mg/dL (ref 65–99)
Potassium: 4.2 mmol/L (ref 3.5–5.1)
Sodium: 147 mmol/L — ABNORMAL HIGH (ref 135–145)
Total Bilirubin: 0.4 mg/dL (ref 0.3–1.2)
Total Protein: 6.8 g/dL (ref 6.5–8.1)

## 2016-03-09 LAB — GLUCOSE, CAPILLARY
GLUCOSE-CAPILLARY: 85 mg/dL (ref 65–99)
GLUCOSE-CAPILLARY: 97 mg/dL (ref 65–99)
GLUCOSE-CAPILLARY: 82 mg/dL (ref 65–99)
GLUCOSE-CAPILLARY: 88 mg/dL (ref 65–99)

## 2016-03-09 LAB — CULTURE, BLOOD (ROUTINE X 2)
CULTURE: NO GROWTH
Culture: NO GROWTH

## 2016-03-09 LAB — CBC
HCT: 30.3 % — ABNORMAL LOW (ref 39.0–52.0)
Hemoglobin: 9.6 g/dL — ABNORMAL LOW (ref 13.0–17.0)
MCH: 28.9 pg (ref 26.0–34.0)
MCHC: 31.7 g/dL (ref 30.0–36.0)
MCV: 91.3 fL (ref 78.0–100.0)
PLATELETS: 432 10*3/uL — AB (ref 150–400)
RBC: 3.32 MIL/uL — AB (ref 4.22–5.81)
RDW: 14 % (ref 11.5–15.5)
WBC: 5.2 10*3/uL (ref 4.0–10.5)

## 2016-03-09 NOTE — Progress Notes (Addendum)
Nutrition Follow-up  DOCUMENTATION CODES:  Non-severe (moderate) malnutrition in context of acute illness/injury, Underweight  INTERVENTION:   Magic cup TID with meals, each supplement provides 290 kcal and 9 grams of protein  NEW NUTRITION DIAGNOSIS:   Increased nutrient needs related to acute illness as evidenced by estimated needs, ongoing  GOAL:   Patient will meet greater than or equal to 90% of their needs, progressing  MONITOR:   PO intake, Supplement acceptance, Labs, Weight trends, Skin, I & O's  ASSESSMENT:   50 yo homeless male smoker with hx of ETOH developed upper respiratory symptoms. Developed progressive confusion, lactic acidosis, AKI. 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 Suffered cardiac arrest, cardiogenic shock with acute systolic CHF, ARDS 3/15.  3/24 trach 3/29 trach collar  Trach removed this AM. S/p MBSS 4/3.  Diet advanced to Dys 1, nectar thick liquids. TF (Vital AF 1.2 formula) discontinued 4/4. Nurse Tech providing feeding assistance with breakfast upon RD visit. Pt would benefit from oral nutrition supplements.  RD to order.  Nutrition-Focused physical exam completed. Findings are mild/moderate fat depletion, mild/moderate muscle depletion, and no edema.   Pt has also had a 15% weight loss since admission (severe for time frame).  Diet Order:  DIET - DYS 1 Room service appropriate?: Yes; Fluid consistency:: Nectar Thick  Skin:  Reviewed, no issues  Last BM:  4/2  Height:   Ht Readings from Last 1 Encounters:  02/16/16 5' 8.9" (1.75 m)    Weight:   Wt Readings from Last 1 Encounters:  03/09/16 124 lb 5.4 oz (56.4 kg)    Ideal Body Weight:  72.7 kg  BMI:  Body mass index is 18.42 kg/(m^2).  Estimated Nutritional Needs:   Kcal:  1850-2100  Protein:  100-115 grams  Fluid:  >1.9 L/day  EDUCATION NEEDS:   No education needs identified at this time  Maureen ChattersKatie Yona Kosek, RD, LDN Pager #: [phone] After-Hours Pager #:  [phone]

## 2016-03-09 NOTE — Progress Notes (Signed)
Pt decannulated this AM by ACNP Tanja PortBabcock with no complications.  Dressing applied.  Pt is on RA with sats of 99%.  Pt able to vocalize.  RT will continue to monitor.

## 2016-03-09 NOTE — Procedures (Signed)
#  6 cuffed trach removed Occlusive dressing applied.  Pt tolerated well.   Simonne MartinetPeter E Loralye Loberg ACNP-BC [facility] Pager # [phone] OR # [phone] if no answer

## 2016-03-09 NOTE — Progress Notes (Signed)
SLP addendum     03/09/16 1521  SLP G-Codes **NOT FOR INPATIENT CLASS**  Functional Assessment Tool Used skilled clinical judgement  Functional Limitations Spoken language expressive  Swallow Current Status (W0981(G8996) CJ  Swallow Goal Status (X9147(G8997) CI      Dale [name] M.Ed [employer]SLP Pager [phone]

## 2016-03-09 NOTE — Progress Notes (Signed)
[facility] PROGRESS NOTE     Name: Dale [name] MRN: [id number] DOB: 1966-09-24    ADMISSION DATE:  02/16/2016   TR[facility] Transfer DATE:  03/04/16    CHIEF COMPLAINT:  AMS  SUBJECTIVE: Continues to have difficulty with following commands, no difficulty swallowing    VITAL SIGNS: BP 99/84 mmHg  Pulse 61  Temp(Src) 97.3 F (36.3 C) (Axillary)  Resp 15  Ht 5' 8.9" (1.75 m)  Wt 56.4 kg (124 lb 5.4 oz)  BMI 18.42 kg/m2  SpO2 98%  HEMODYNAMICS:    VENTILATOR SETTINGS: Vent Mode:  [-]  FiO2 (%):  [21 %] 21 %  INTAKE / OUTPUT: I/O last 3 completed shifts: In: 3416.6 [P.O.:480; I.V.:1982.5; NG/GT:954.1] Out: 4350 [Urine:4350]  PHYSICAL EXAMINATION: General: chronically ill appearing male, unable to follow commands Neuro:  Awake,  .  HEENT: [place]/AT, PERRL, #6 trach midline, thick  Purulent secretions per trache Cardiac: RRR, Nl S1/S2, -M/R/G. Chest: resps even non-labored,  rhonchi BLF.  Abd: Soft, NT, ND and +BS. Ext: Intact, warm/dry   LABS:  PULMONARY  Recent Labs Lab 03/04/16 0438 03/05/16 0614 03/06/16 0630 03/07/16 0423 03/08/16 0525  O2SAT 77.8 87.0 78.0 85.3 64.5    CBC  Recent Labs Lab 03/06/16 0635 03/08/16 0526 03/09/16 0449  HGB 9.8* 9.3* 9.6*  HCT 29.1* 28.2* 30.3*  WBC 6.7 5.1 5.2  PLT 449* 400 432*   COAGULATION No results for input(s): INR in the last 168 hours. CARDIAC No results for input(s): TROPONINI in the last 168 hours. No results for input(s): PROBNP in the last 168 hours.  CHEMISTRY  Recent Labs Lab 03/03/16 0448 03/04/16 0430 03/05/16 0520 03/06/16 9147 03/07/16 0544 03/08/16 0526 03/09/16 0449  NA 148* 145 144 144 145 146* 147*  K 4.2 4.0 4.1 4.2 4.3 4.0 4.2  CL 105 106 109 108 108 113* 110  CO2 GLUCOSE 107* 214* 108* 100* 93 100* 96  BUN 38* 44* 46* 42* 41* 35* 31*  CREATININE 1.50* 1.81* 1.88* 1.65* 1.47* 1.43* 1.37*  CALCIUM 9.1 8.2* 8.7* 8.9 8.8* 8.4* 9.1  MG 2.2 2.2 2.4 2.4  --   --   --    PHOS 6.5* 6.0* 5.0* 4.8*  --   --   --    Estimated Creatinine Clearance: 50.9 mL/min (by C-G formula based on Cr of 1.37).  LIVER  Recent Labs Lab 03/05/16 0520 03/06/16 0635 03/07/16 0544 03/08/16 0526 03/09/16 0449  AST 85* 42* 34 29 27  ALT 101* 72* 59 45 45  ALKPHOS 66 65 62 60 59  BILITOT 0.5 0.3 0.4 0.4 0.4  PROT 6.9 6.8 6.6 5.7* 6.8  ALBUMIN 2.4* 2.3* 2.4* 2.2* 2.4*   INFECTIOUS No results for input(s): LATICACIDVEN, PROCALCITON in the last 168 hours.   ENDOCRINE CBG (last 3)   Recent Labs  03/08/16 1604 03/08/16 2031 03/09/16 0747  GLUCAP 102* 78 85   IMAGING x48h Dg Swallowing Func-speech Pathology  03/07/2016  Objective Swallowing Evaluation: Type of Study: MBS-Modified Barium Swallow Study Patient Details Name: VINAL ROSENGRANT MRN: [id number] Date of Birth: July 10, 1966 Today's Date: 03/07/2016 Time: SLP Start Time (ACUTE ONLY): 0928-SLP Stop Time (ACUTE ONLY): 8657 SLP Time Calculation (min) (ACUTE ONLY): 24 min Past Medical History: Past Medical History Diagnosis Date . Depression  Past Surgical History: Past Surgical History Procedure Laterality Date . Ankle surgery   . Cardiac catheterization N/A 02/17/2016   Procedure: IABP Insertion;  Surgeon: Iran Ouch,  MD;  Location: [facility];  Service: Cardiovascular;  Laterality: N/A; HPI: 50 y.o. male with h/o depression who initially presented to [facility] ED 3/11 with URI symptoms and was d/c with Tamiflu. Family brought pt to ED 3/14 with leukocytosis, fever of 103, decreased consciousness and minimally responsive. 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 Pt intubated in ED, trach placed 3/24 due to inability to wean off vent. No Data Recorded Assessment / Plan / Recommendation CHL IP CLINICAL IMPRESSIONS 03/07/2016 Therapy Diagnosis Mild oral phase dysphagia;Mild pharyngeal phase dysphagia Clinical Impression MBS complete with PMSV donned throughout  assessment. Pt exhibited mild oropharyngeal dysphagia during this study due to sensory deficits. Weak lingual manipulation resulted in decreased bolus cohesion. Delayed swallow initiation to the vallecula and pyriform sinuses observed, resulting in silent penetration above the vocal cords x1 with intake of thin liquids. No compensatory strategies attempted due to altered mental status and inability to comply. No airway compromise noted for all other consistencies and no pharyngeal residue observed. Due to pt's decreased mental status, recommend Dysphagia 1 (puree) textures to prevent pocketing with nectar thick liquids (no straws) and meds crushed in puree, with PMSV donned. Pt and wife educated re: diet recommendation and PMSV use/precautions. SLP will f/u to determine diet tolerance and advise diet tolerance. Impact on safety and function (No Data)   CHL IP TREATMENT RECOMMENDATION 03/07/2016 Treatment Recommendations Therapy as outlined in treatment plan below   Prognosis 03/07/2016 Prognosis for Safe Diet Advancement Good Barriers to Reach Goals Severity of deficits;Time post onset Barriers/Prognosis Comment -- CHL IP DIET RECOMMENDATION 03/07/2016 SLP Diet Recommendations Dysphagia 1 (Puree) solids;Nectar thick liquid Liquid Administration via Cup;No straw Medication Administration Crushed with puree Compensations Minimize environmental distractions;Slow rate;Small sips/bites Postural Changes Seated upright at 90 degrees   CHL IP OTHER RECOMMENDATIONS 03/07/2016 Recommended Consults -- Oral Care Recommendations Oral care BID Other Recommendations Order thickener from pharmacy;Prohibited food (jello, ice cream, thin soups);Remove water pitcher;Have oral suction available;Place PMSV during PO intake   CHL IP FOLLOW UP RECOMMENDATIONS 03/07/2016 Follow up Recommendations (No Data)   CHL IP FREQUENCY AND DURATION 03/07/2016 Speech Therapy Frequency (ACUTE ONLY) min 2x/week Treatment Duration 2 weeks      CHL IP ORAL PHASE  03/07/2016 Oral Phase Impaired Oral - Pudding Teaspoon -- Oral - Pudding Cup -- Oral - Honey Teaspoon -- Oral - Honey Cup -- Oral - Nectar Teaspoon -- Oral - Nectar Cup Decreased bolus cohesion;Weak lingual manipulation Oral - Nectar Straw (No Data) Oral - Thin Teaspoon -- Oral - Thin Cup Decreased bolus cohesion;Weak lingual manipulation Oral - Thin Straw -- Oral - Puree Decreased bolus cohesion;Weak lingual manipulation Oral - Mech Soft -- Oral - Regular -- Oral - Multi-Consistency -- Oral - Pill -- Oral Phase - Comment --  CHL IP PHARYNGEAL PHASE 03/07/2016 Pharyngeal Phase Impaired Pharyngeal- Pudding Teaspoon -- Pharyngeal -- Pharyngeal- Pudding Cup -- Pharyngeal -- Pharyngeal- Honey Teaspoon -- Pharyngeal -- Pharyngeal- Honey Cup -- Pharyngeal -- Pharyngeal- Nectar Teaspoon -- Pharyngeal -- Pharyngeal- Nectar Cup Delayed swallow initiation-pyriform sinuses;Penetration/Aspiration during swallow Pharyngeal Material enters airway, remains ABOVE vocal cords and not ejected out Pharyngeal- Nectar Straw (No Data) Pharyngeal -- Pharyngeal- Thin Teaspoon -- Pharyngeal -- Pharyngeal- Thin Cup Penetration/Aspiration during swallow;Delayed swallow initiation-pyriform sinuses Pharyngeal Material enters airway, remains ABOVE vocal cords and not ejected out Pharyngeal- Thin Straw -- Pharyngeal -- Pharyngeal- Puree Delayed swallow initiation-vallecula Pharyngeal -- Pharyngeal- Mechanical Soft -- Pharyngeal -- Pharyngeal- Regular --  Pharyngeal -- Pharyngeal- Multi-consistency -- Pharyngeal -- Pharyngeal- Pill -- Pharyngeal -- Pharyngeal Comment --  CHL IP CERVICAL ESOPHAGEAL PHASE 03/07/2016 Cervical Esophageal Phase WFL Pudding Teaspoon -- Pudding Cup -- Honey Teaspoon -- Honey Cup -- Nectar Teaspoon -- Nectar Cup -- Nectar Straw -- Thin Teaspoon -- Thin Cup -- Thin Straw -- Puree -- Mechanical Soft -- Regular -- Multi-consistency -- Pill -- Cervical Esophageal Comment -- No flowsheet data found. Royce Macadamia 03/07/2016,  11:17 AM Breck Coons Lonell Face.Ed CCC-SLP Pager [phone]              STUDIES:  3/14 CT head > multifocal sinusitis, enlarged ventricles 3/14 LP >> glucose < 20, protein > 600, WBC 340 (88%N), 89 RBC  CULTURES: 3/14 Blood >> strep pneumo 3/14 Urine >> neg 3/14 CSF >> strep pneumo  ANTIBIOTICS: Anti-infectives    Start     Dose/Rate Route Frequency Ordered Stop   02/22/16 0930  penicillin g benzathine (BICILLIN LA) 1200000 UNIT/2ML injection 2.4 Million Units     2.4 Million Units Intramuscular Weekly 02/22/16 0836 03/07/16 1313   02/17/16 1430  vancomycin (VANCOCIN) IVPB 750 mg/150 ml premix  Status:  Discontinued     750 mg 150 mL/hr over 60 Minutes Intravenous Every 24 hours 02/16/16 1447 02/17/16 0805   02/17/16 1200  cefTRIAXone (ROCEPHIN) 1 g in dextrose 5 % 50 mL IVPB  Status:  Discontinued     1 g 100 mL/hr over 30 Minutes Intravenous Every 24 hours 02/16/16 1143 02/16/16 1144   02/17/16 1200  cefTRIAXone (ROCEPHIN) 2 g in dextrose 5 % 50 mL IVPB  Status:  Discontinued     2 g 100 mL/hr over 30 Minutes Intravenous Every 24 hours 02/16/16 1144 02/16/16 1447   02/17/16 1200  ampicillin (OMNIPEN) 2 g in sodium chloride 0.9 % 50 mL IVPB  Status:  Discontinued     2 g 150 mL/hr over 20 Minutes Intravenous 4 times per day 02/17/16 0803 02/17/16 0812   02/17/16 0830  vancomycin (VANCOCIN) 500 mg in sodium chloride 0.9 % 100 mL IVPB  Status:  Discontinued     500 mg 100 mL/hr over 60 Minutes Intravenous Every 12 hours 02/17/16 0805 02/19/16 1407   02/17/16 0000  cefTRIAXone (ROCEPHIN) 2 g in dextrose 5 % 50 mL IVPB     2 g 100 mL/hr over 30 Minutes Intravenous Every 12 hours 02/16/16 1447 02/29/16 1338   02/16/16 1500  vancomycin (VANCOCIN) IVPB 1000 mg/200 mL premix     1,000 mg 200 mL/hr over 60 Minutes Intravenous  Once 02/16/16 1447 02/16/16 2122   02/16/16 1500  ampicillin (OMNIPEN) 2 g in sodium chloride 0.9 % 50 mL IVPB  Status:  Discontinued     2 g 150 mL/hr over 20  Minutes Intravenous 3 times per day 02/16/16 1447 02/17/16 0803   02/16/16 1145  cefTRIAXone (ROCEPHIN) 2 g in dextrose 5 % 50 mL IVPB  Status:  Discontinued     2 g 100 mL/hr over 30 Minutes Intravenous  Once 02/16/16 1130 02/16/16 1136   02/16/16 1145  cefTRIAXone (ROCEPHIN) 1 g in dextrose 5 % 50 mL IVPB  Status:  Discontinued     1 g 100 mL/hr over 30 Minutes Intravenous Every 24 hours 02/16/16 1136 02/16/16 1143   02/16/16 1145  cefTRIAXone (ROCEPHIN) 2 g in dextrose 5 % 50 mL IVPB     2 g 100 mL/hr over 30 Minutes Intravenous  Once 02/16/16 1143 02/16/16 1711  LINES/TUBES: 3/14 ETT >> 3/24 3/15 Rt femoral CVL >> 3/24 3/15 Rt femoral aline >> out 3/15 Rt IJ introducer >> out  3/24 RUE PICC >>   SIGNIFICANT EVENTS: 3/14  Admit, VDRF, ID/cardiology/neurology consulted 3/15  Cardiac arrest, 5 mins, Asystole > VF > ST; ARDS, septic/cardiogenic shock >> IABP; start paralytic 3/16  Remains on paralytic, pressors, inotropes. 3/18  IABP to be removed 3/18  Nimbex discontinued  3/24  Bedside trach    Brief summary 50 y/o reportedly homeless male, smoker, with PMH of ETOH abuse admitted 3/14 for worsening upper respiratory symptoms.  Developed progressive confusion, lactic acidosis, AKI. 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 Suffered cardiac arrest, cardiogenic shock with acute systolic CHF, ARDS 3/15.  Trach 3/24.    ASSESSMENT / PLAN: S/P Acute respiratory failure 2nd to ARDS, acute pulmonary edema S/P Tracheostomy - 3/24 might be ready for decannulation soon As per pulmonary recommendations on 4/4  Former Tobacco Abuse Pleural effusion on cxr 3/27 Daily trach collar >> off vent since 3/30,  improved FiO2 requirements Last chest x-ray 4/1 shows improved bibasilar opacities consistent with improving pulmonary edema infiltrate/edema,  PRN albuterol , incentive spirometry Lasix discontinued because of worsening kidney function Strict aspiration precautions, might be ready for  decannulation soon as per pulmonary Anticipate transfer to SNF sometime this week    Dysphagia. - D1 diet Dysphagia 1 (puree);Nectar-thick liquid - d/c'd  NG tube and tube feedings 4/04 No indication for PEG at this time Please consider changing trach tube to : Cuffless vs decannulation   S/P Septic/cardiogenic shock - resolved, in setting of streptococcal bacteremia and meningitis Acute systolic CHF  CHF: EF 20-25% on 3/16 echo. Repeat ECHO with EF 50-55%.  Cardiac arrest 3/15 CXR with B pleural effusion -- worse (3/27) NSTEMI Cardiology following, last seen on 3/24 by the heart failure team Continue amiodarone, coreg, held bidil &   spironolactone [worsening renal function]:/Hypotension Lasix on hold because of worsening renal function      AKI 2nd to ATN. Creatinine on the rise, over diuresis? 1.5>1.81>1.88>1.37 Hypokalemia-repleted Hypernatremia-continue half normal saline given worsening renal function, change half normal saline to D51/4 ns given rising sodium Closely follow renal function, sodium Lasix on hold       Shock liver - AST/ALT slightly higher, resolved Liver function normalizing    Anemia ,Leukocytosis - resolved  F/u CBC Heparin for DVT prophylaxis     Streptococcal bacteremia and meningitis RPR positive Day 14 ceftriaxone, decadron per ID  >> dcd on 3/27 Pen G 2.4 million units IM weekly until 4/10 No acitive infxn for now   Hypoglycemia-resolved Relative adrenal insufficiency >> cortisol 15.2 from 3/15 SSI with CBG Q4, continue D5 half normal saline to prevent hypoglycemia Cont insulin coverage.  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 Status post neurology consultation 3/14 Initial neurology consultation on 3/14 recommended MRI whenever able to do so EEG showed burst suppression pattern likely from a combination of fentanyl, in addition to severe encephalopathy.  No seizures noted on the EEG.  Overall, poor prognosis for neurological recovery. Therefore palliative care consultation requested which is still pending   Hx of ETOH, THC Streptococcal meningitis  Thiamine, folic acid Off sedation Anca neg, ana pos, ds-dna neg results, speckled pattern noticed.    Disposition: . Continue SDU. discharged delayed due to ongoing medical issues , possible decannulation,  patient needs SNF placement overall prognosis for neurological recovery is poor (  neurology notes on 3/15) -palliative care consult  Discussed with patient's wife   Richarda OverlieNAYANA Ronae Noell , MD 03/09/2016, 9:03 AM  Pager [phone]

## 2016-03-09 NOTE — Progress Notes (Signed)
PULMONARY / CRITICAL CARE MEDICINE   Name: Dale [name] MRN: [id number] DOB: 12/09/1965    ADMISSION DATE:  02/16/2016   CONSULTATION DATE:  02/16/16  REFERRING MD:  Rito EhrlichKrishnan  CHIEF COMPLAINT:  AMS  SUBJECTIVE:   No distress.   VITAL SIGNS: BP 101/83 mmHg  Pulse 60  Temp(Src) 97.3 F (36.3 C) (Axillary)  Resp 15  Ht 5' 8.9" (1.75 m)  Wt 124 lb 5.4 oz (56.4 kg)  BMI 18.42 kg/m2  SpO2 99%  INTAKE / OUTPUT: I/O last 3 completed shifts: In: 3416.6 [P.O.:480; I.V.:1982.5; NG/GT:954.1] Out: 4350 [Urine:4350]  PHYSICAL EXAMINATION: General: no distress Neuro: follows commands, confused  HEENT: trach site clean, now dressed s/p trach removal.  Cardiac: regular, no murmur Chest: no wheeze, clear  Abd: soft, non tender Ext: no edema Skin: no rashes   LABS:  CBC  Recent Labs Lab 03/06/16 0635 03/08/16 0526 03/09/16 0449  HGB 9.8* 9.3* 9.6*  HCT 29.1* 28.2* 30.3*  WBC 6.7 5.1 5.2  PLT 449* 400 432*   CHEMISTRY  Recent Labs Lab 03/03/16 0448 03/04/16 0430 03/05/16 0520 03/06/16 0635 03/07/16 0544 03/08/16 0526 03/09/16 0449  NA 148* 145 144 144 145 146* 147*  K 4.2 4.0 4.1 4.2 4.3 4.0 4.2  CL 105 106 109 108 108 113* 110  CO2 31 26 23 24 25 22 25   GLUCOSE 107* 214* 108* 100* 93 100* 96  BUN 38* 44* 46* 42* 41* 35* 31*  CREATININE 1.50* 1.81* 1.88* 1.65* 1.47* 1.43* 1.37*  CALCIUM 9.1 8.2* 8.7* 8.9 8.8* 8.4* 9.1  MG 2.2 2.2 2.4 2.4  --   --   --   PHOS 6.5* 6.0* 5.0* 4.8*  --   --   --    Estimated Creatinine Clearance: 50.9 mL/min (by C-G formula based on Cr of 1.37).  LIVER  Recent Labs Lab 03/05/16 0520 03/06/16 0635 03/07/16 0544 03/08/16 0526 03/09/16 0449  AST 85* 42* 34 29 27  ALT 101* 72* 59 45 45  ALKPHOS 66 65 62 60 59  BILITOT 0.5 0.3 0.4 0.4 0.4  PROT 6.9 6.8 6.6 5.7* 6.8  ALBUMIN 2.4* 2.3* 2.4* 2.2* 2.4*    ENDOCRINE CBG (last 3)   Recent Labs  03/08/16 1604 03/08/16 2031 03/09/16 0747  GLUCAP 102* 78 85    IMAGING x48h Dg Swallowing Func-speech Pathology  03/07/2016  Objective Swallowing Evaluation: Type of Study: MBS-Modified Barium Swallow Study Patient Details Name: Dale [name] MRN: [id number] Date of Birth: 10/19/1966 Today's Date: 03/07/2016 Time: SLP Start Time (ACUTE ONLY): 0928-SLP Stop Time (ACUTE ONLY): 78290952 SLP Time Calculation (min) (ACUTE ONLY): 24 min Past Medical History: Past Medical History Diagnosis Date . Depression  Past Surgical History: Past Surgical History Procedure Laterality Date . Ankle surgery   . Cardiac catheterization N/A 02/17/2016   Procedure: IABP Insertion;  Surgeon: Iran OuchMuhammad A Arida, MD;  Location: [facility];  Service: Cardiovascular;  Laterality: N/A; HPI: 50 y.o. male with h/o depression who [facility] with URI symptoms and was d/c with Tamiflu. Family brought pt to ED 3/14 with leukocytosis, fever of 103, decreased consciousness and minimally responsive. 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 Pt intubated in ED, trach placed 3/24 due to inability to wean off vent. No Data Recorded Assessment / Plan / Recommendation CHL IP CLINICAL IMPRESSIONS 03/07/2016 Therapy Diagnosis Mild oral phase dysphagia;Mild pharyngeal phase dysphagia Clinical Impression MBS complete with PMSV  donned throughout assessment. Pt exhibited mild oropharyngeal dysphagia during this study due to sensory deficits. Weak lingual manipulation resulted in decreased bolus cohesion. Delayed swallow initiation to the vallecula and pyriform sinuses observed, resulting in silent penetration above the vocal cords x1 with intake of thin liquids. No compensatory strategies attempted due to altered mental status and inability to comply. No airway compromise noted for all other consistencies and no pharyngeal residue observed. Due to pt's decreased mental status, recommend Dysphagia 1 (puree) textures to prevent pocketing with  nectar thick liquids (no straws) and meds crushed in puree, with PMSV donned. Pt and wife educated re: diet recommendation and PMSV use/precautions. SLP will f/u to determine diet tolerance and advise diet tolerance. Impact on safety and function (No Data)   CHL IP TREATMENT RECOMMENDATION 03/07/2016 Treatment Recommendations Therapy as outlined in treatment plan below   Prognosis 03/07/2016 Prognosis for Safe Diet Advancement Good Barriers to Reach Goals Severity of deficits;Time post onset Barriers/Prognosis Comment -- CHL IP DIET RECOMMENDATION 03/07/2016 SLP Diet Recommendations Dysphagia 1 (Puree) solids;Nectar thick liquid Liquid Administration via Cup;No straw Medication Administration Crushed with puree Compensations Minimize environmental distractions;Slow rate;Small sips/bites Postural Changes Seated upright at 90 degrees   CHL IP OTHER RECOMMENDATIONS 03/07/2016 Recommended Consults -- Oral Care Recommendations Oral care BID Other Recommendations Order thickener from pharmacy;Prohibited food (jello, ice cream, thin soups);Remove water pitcher;Have oral suction available;Place PMSV during PO intake   CHL IP FOLLOW UP RECOMMENDATIONS 03/07/2016 Follow up Recommendations (No Data)   CHL IP FREQUENCY AND DURATION 03/07/2016 Speech Therapy Frequency (ACUTE ONLY) min 2x/week Treatment Duration 2 weeks      CHL IP ORAL PHASE 03/07/2016 Oral Phase Impaired Oral - Pudding Teaspoon -- Oral - Pudding Cup -- Oral - Honey Teaspoon -- Oral - Honey Cup -- Oral - Nectar Teaspoon -- Oral - Nectar Cup Decreased bolus cohesion;Weak lingual manipulation Oral - Nectar Straw (No Data) Oral - Thin Teaspoon -- Oral - Thin Cup Decreased bolus cohesion;Weak lingual manipulation Oral - Thin Straw -- Oral - Puree Decreased bolus cohesion;Weak lingual manipulation Oral - Mech Soft -- Oral - Regular -- Oral - Multi-Consistency -- Oral - Pill -- Oral Phase - Comment --  CHL IP PHARYNGEAL PHASE 03/07/2016 Pharyngeal Phase Impaired Pharyngeal- Pudding  Teaspoon -- Pharyngeal -- Pharyngeal- Pudding Cup -- Pharyngeal -- Pharyngeal- Honey Teaspoon -- Pharyngeal -- Pharyngeal- Honey Cup -- Pharyngeal -- Pharyngeal- Nectar Teaspoon -- Pharyngeal -- Pharyngeal- Nectar Cup Delayed swallow initiation-pyriform sinuses;Penetration/Aspiration during swallow Pharyngeal Material enters airway, remains ABOVE vocal cords and not ejected out Pharyngeal- Nectar Straw (No Data) Pharyngeal -- Pharyngeal- Thin Teaspoon -- Pharyngeal -- Pharyngeal- Thin Cup Penetration/Aspiration during swallow;Delayed swallow initiation-pyriform sinuses Pharyngeal Material enters airway, remains ABOVE vocal cords and not ejected out Pharyngeal- Thin Straw -- Pharyngeal -- Pharyngeal- Puree Delayed swallow initiation-vallecula Pharyngeal -- Pharyngeal- Mechanical Soft -- Pharyngeal -- Pharyngeal- Regular -- Pharyngeal -- Pharyngeal- Multi-consistency -- Pharyngeal -- Pharyngeal- Pill -- Pharyngeal -- Pharyngeal Comment --  CHL IP CERVICAL ESOPHAGEAL PHASE 03/07/2016 Cervical Esophageal Phase WFL Pudding Teaspoon -- Pudding Cup -- Honey Teaspoon -- Honey Cup -- Nectar Teaspoon -- Nectar Cup -- Nectar Straw -- Thin Teaspoon -- Thin Cup -- Thin Straw -- Puree -- Mechanical Soft -- Regular -- Multi-consistency -- Pill -- Cervical Esophageal Comment -- No flowsheet data found. Royce Macadamia 03/07/2016, 11:17 AM Breck Coons Lonell Face.Ed CCC-SLP Pager [phone]              STUDIES:  3/14 CT head >  multifocal sinusitis, enlarged ventricles 3/14 LP >> glucose < 20, protein > 600, WBC 340 (88%N), 89 RBC  CULTURES: 3/14 Blood >> strep pneumo 3/14 Urine >> negative 3/14 CSF >> strep pneumo  LINES/TUBES: 3/14 ETT >> 3/24 3/24 RUE PICC >>  3/24 Trach [place] Lights) >>   SIGNIFICANT EVENTS: 3/14  Admit, VDRF, ID/cardiology/neurology consulted 3/15  Cardiac arrest, 5 mins, Asystole > VF > ST; ARDS, septic/cardiogenic shock >> IABP; start paralytic 3/16  Remains on paralytic, pressors, inotropes. 3/18   IABP to be removed 3/18  Nimbex discontinued  3/24  Bedside trach >> #6 4/5 trach removed  DISCUSSION: 50 y/o reportedly homeless male, smoker, with PMH of ETOH abuse admitted 3/14 for worsening upper respiratory symptoms.  Developed progressive confusion, lactic acidosis, AKI. 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 Suffered cardiac arrest, cardiogenic shock with acute systolic CHF, ARDS 3/15.  Trach 3/24, now decannulated. PCCM will s/o  ASSESSMENT / PLAN:  Acute on chronic respiratory failure 2nd to ARDS, acute pulmonary edema. Failure to wean from vent s/p S/P tracheostomy. Hx of tobacco abuse. Pleural effusion on cxr 3/27 - strong voice and no distress w/ trach finger occlusion. Decannulated this am 4/5 - wean O2 as needed - cont aspiration precautions - Dressing change daily until stoma closed.   Dysphagia. - D1 diet - f/u with speech therapy - I don't think he needs PEG at this time  Streptococcal bacteremia and meningitis. - Weekly IM penicillin G until 4/10  Deconditioning. - PT  Simonne Martinet ACNP-BC [facility] Pulmonary/Critical Care Pager # [phone] OR # [phone] if no answer

## 2016-03-09 NOTE — Progress Notes (Signed)
Speech Language Pathology Treatment: Dysphagia  Patient Details Name: Dale [name] MRN: [id number] DOB: 07/31/1966 Today's Date: 03/09/2016 Time: [id number] SLP Time Calculation (min) (ACUTE ONLY): 8 min  Assessment / Plan / Recommendation Clinical Impression  Pt asleep upon SLP arrival but awakened easily. He was decannulated this morning, with good but mildly soft vocal quality observed. He consumed nectar thick liquids and pureed solids without overt s/s of aspiration, needing extra time for oral transit. Mild anterior spillage noted between his lips, which he cleared with lingual sweep with Min cues. Will continue to follow.   HPI HPI: 50 y.o. male with h/o depression who [facility] with URI symptoms and was d/c with Tamiflu. Family brought pt to ED 3/14 with leukocytosis, fever of 103, decreased consciousness and minimally responsive. 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 Pt intubated in ED, trach placed 3/24 due to inability to wean off vent.      SLP Plan  Continue with current plan of care     Recommendations  Diet recommendations: Dysphagia 1 (puree);Nectar-thick liquid Liquids provided via: Cup Medication Administration: Crushed with puree Supervision: Staff to assist with self feeding;Full supervision/cueing for compensatory strategies Compensations: Minimize environmental distractions;Slow rate;Small sips/bites Postural Changes and/or Swallow Maneuvers: Seated upright 90 degrees             Oral Care Recommendations: Oral care BID Follow up Recommendations: Skilled Nursing facility Plan: Continue with current plan of care     GO               Maxcine HamLaura Paiewonsky, M.A. CCC-SLP [phone]  Maxcine Hamaiewonsky, [place] 03/09/2016, 2:13 PM

## 2016-03-10 LAB — COMPREHENSIVE METABOLIC PANEL
ALT: 41 U/L (ref 17–63)
ANION GAP: 10 (ref 5–15)
AST: 31 U/L (ref 15–41)
Albumin: 2.4 g/dL — ABNORMAL LOW (ref 3.5–5.0)
Alkaline Phosphatase: 59 U/L (ref 38–126)
BILIRUBIN TOTAL: 0.5 mg/dL (ref 0.3–1.2)
BUN: 27 mg/dL — ABNORMAL HIGH (ref 6–20)
CALCIUM: 9 mg/dL (ref 8.9–10.3)
CO2: 26 mmol/L (ref 22–32)
Chloride: 106 mmol/L (ref 101–111)
Creatinine, Ser: 1.67 mg/dL — ABNORMAL HIGH (ref 0.61–1.24)
GFR calc non Af Amer: 46 mL/min — ABNORMAL LOW (ref >60)
GFR, EST AFRICAN AMERICAN: 53 mL/min — AB (ref >60)
Glucose, Bld: 91 mg/dL (ref 65–99)
Potassium: 4.5 mmol/L (ref 3.5–5.1)
Sodium: 142 mmol/L (ref 135–145)
TOTAL PROTEIN: 6.5 g/dL (ref 6.5–8.1)

## 2016-03-10 LAB — GLUCOSE, CAPILLARY
GLUCOSE-CAPILLARY: 79 mg/dL (ref 65–99)
GLUCOSE-CAPILLARY: 84 mg/dL (ref 65–99)

## 2016-03-10 MED ORDER — CARVEDILOL 6.25 MG PO TABS: 6.2500 mg | ORAL_TABLET | Freq: Two times a day (BID) | ORAL | Status: DC

## 2016-03-10 MED ORDER — ASPIRIN 81 MG PO CHEW: 81.0000 mg | CHEWABLE_TABLET | Freq: Every day | ORAL | Status: DC

## 2016-03-10 MED ORDER — TRAMADOL HCL 50 MG PO TABS: 50.0000 mg | ORAL_TABLET | Freq: Four times a day (QID) | ORAL | Status: DC | PRN

## 2016-03-10 MED ORDER — PENICILLIN G BENZATHINE 2400000 UNIT/4ML IM SUSP: 2.4000 10*6.[IU] | Freq: Once | INTRAMUSCULAR | Status: DC

## 2016-03-10 MED ORDER — ACETAMINOPHEN 160 MG/5ML PO SOLN: 650.0000 mg | Freq: Four times a day (QID) | ORAL | Status: DC | PRN

## 2016-03-10 MED ORDER — ALBUTEROL SULFATE (2.5 MG/3ML) 0.083% IN NEBU: 2.5000 mg | INHALATION_SOLUTION | RESPIRATORY_TRACT | Status: DC | PRN

## 2016-03-10 MED ORDER — AMIODARONE HCL 200 MG PO TABS: 200.0000 mg | ORAL_TABLET | Freq: Every day | ORAL | Status: DC

## 2016-03-10 MED ORDER — THIAMINE HCL 100 MG PO TABS: 100.0000 mg | ORAL_TABLET | Freq: Every day | ORAL | Status: DC

## 2016-03-10 MED ORDER — PANTOPRAZOLE SODIUM 40 MG PO PACK: 40.0000 mg | PACK | ORAL | Status: DC

## 2016-03-10 MED ORDER — FOLIC ACID 1 MG PO TABS: 1.0000 mg | ORAL_TABLET | Freq: Every day | ORAL | Status: DC

## 2016-03-10 MED ORDER — RESOURCE THICKENUP CLEAR PO POWD: ORAL | Status: DC

## 2016-03-10 NOTE — Progress Notes (Signed)
03/10/2016 [facility] he was transfer via [facility]. RN call daughters phone today and the other contact list for emergency was not able to get in contact with anyone. [facility] Bac[facility].

## 2016-03-10 NOTE — Clinical Social Work Placement (Signed)
   CLINICAL SOCIAL WORK PLACEMENT  NOTE  Date:  03/10/2016  Patient Details  Name: Dale [name] MRN: [id number] Date of Birth: 02/23/1966  Clinical Social Work is seeking post-discharge placement for this patient at the Skilled  Nursing Facility level of care (*CSW will initial, date and re-position this form in  chart as items are completed):  Yes   Patient/family provided with [place] [facility] Clinical Social Work Department's list of facilities offering this level of care within the geographic area requested by the patient (or if unable, by the patient's family).  Yes   Patient/family informed of their freedom to choose among providers that offer the needed level of care, that participate in Medicare, Medicaid or managed care program needed by the patient, have an available bed and are willing to accept the patient.  Yes   Patient/family informed of [facility]'s ownership interest in [facility] and [facility], as well as of the fact that they are under no obligation to receive care at these facilities.  PASRR submitted to EDS on 03/01/16     PASRR number received on 03/01/16     Existing PASRR number confirmed on       FL2 transmitted to all facilities in geographic area requested by pt/family on 03/01/16     FL2 transmitted to all facilities within larger geographic area on       Patient informed that his/her managed care company has contracts with or will negotiate with certain facilities, including the following:        Yes   Patient/family informed of bed offers received.  [facility]     Physician recommends and patient chooses bed at      [facility] 03[facility].  Patient to be transferred to facility by [facility]     Patient family notified on 03/10/16 of transfer.  Name of family member notified:  [name]     PHYSICIAN Please sign DNR, Please sign  FL2     Additional Comment:    _______________________________________________ Izora RibasHoloman, Kalub Morillo M, LCSW 03/10/2016, 1:10 PM

## 2016-03-10 NOTE — Progress Notes (Signed)
Physical Therapy Treatment Patient Details Name: Dale [name] MRN: [id number] DOB: [birth year] Today's Date: 03/10/2016    History of Present Illness 50 y/o reportedly homeless male, smoker, with PMH of ETOH abuse admitted 3/14 for worsening upper respiratory symptoms. Developed progressive confusion, lactic acidosis, AKI. 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 Suffered cardiac arrest, cardiogenic shock with acute systolic CHF, ARDS 3/15. Trach 3/24.    PT Comments    Pt did well with therapy today. He presents with functional limitations due to decreased strength, balance, and altered mental status. At this time, pt's biggest limitation is attention, memory, ability to problem solve, and follow commands. Pt will inconsistently follow commands with heavy verbal and tactile cues, but will respond to most things if the command is started with "Dale [name]". Pt seems to have some R side inattention as he will not track with his eyes to the R past midline, has L side gaze at rest and will not respond to loud noises on the R side. Pt moves extremities well, but occasionally the L arm becomes "flaccid" throughout mobility. Pt able to ambulate around the bed, but with manual facilitation of stepping and weight shifting with Max +2 A. Pt will continue to benefit from PT services to work towards increasing functional mobility.    Follow Up Recommendations  SNF;Supervision/Assistance - 24 hour     Equipment Recommendations  Other (comment) (TBD)    Recommendations for Other Services       Precautions / Restrictions Precautions Precautions: Fall Precaution Comments: pt no longer with trach or feeding tube. Restrictions Weight Bearing Restrictions: No Other Position/Activity Restrictions: B Hand mitts    Mobility  Bed Mobility Overal bed mobility: Needs Assistance Bed Mobility: Supine to Sit;Sit to Supine     Supine to sit: Mod assist;HOB elevated Sit to supine: Mod assist;HOB elevated   General  bed mobility comments: Pt was able to start moving legs off the bed to get up and into the bed to lay back down but could not follow through.  Needs heavy verbal and tactile cues for initiation and sequencing.   Transfers Overall transfer level: Needs assistance Equipment used: 2 person hand held assist Transfers: Sit to/from Stand Sit to Stand: Mod assist;+2 physical assistance         General transfer comment: +2 to stand from bed with 2 person hand hold assist. Pt needs heavy verbal and tactile cues for initiation and sequencing. Stood from EOB 5x.  Deferred transfer to chair as pt tends to slide way down and bed and there is safety concerns that he would slide out the chair.   Ambulation/Gait Ambulation/Gait assistance: Max assist;+2 physical assistance;+2 safety/equipment Ambulation Distance (Feet): 10 Feet Assistive device: Rolling walker (2 wheeled) Gait Pattern/deviations: Step-to pattern;Decreased stride length;Ataxic;Scissoring;Shuffle;Festinating;Trendelenburg;Leaning posteriorly;Staggering right;Staggering left;Trunk flexed;Narrow base of support Gait velocity: very slow   General Gait Details: Pt is very unstable with ambulation and is mostly limited by attention and ability to problem solve. Pt required manual facilitation of gait including sequencing for weight shifting and for each step from behind has he tends to lean posteriorly and then another person steering the walker and managing lines from the front. He needed heavy cues to walk forward and to maintain hand placement on the walker.    Stairs            Wheelchair Mobility    Modified Rankin (Stroke Patients Only)       Balance Overall balance assessment: Needs assistance Sitting-balance support: Bilateral upper extremity  supported;Feet supported Sitting balance-Leahy Scale: Fair Sitting balance - Comments: Pt able to sit EOB for ~10 minutes but requires VC's to remain upright. Pt likes to lean over on  his forearms on top of his thighs when he gets tired.    Standing balance support: Bilateral upper extremity supported Standing balance-Leahy Scale: Zero Standing balance comment: Reliant on UE support and Mod A for static standing. Pt leans posteriorly.                     Cognition Arousal/Alertness: Awake/alert Behavior During Therapy: Flat affect Overall Cognitive Status: Difficult to assess Area of Impairment: Orientation;Attention;Memory;Following commands;Safety/judgement;Awareness;Problem solving Orientation Level:  (unsure, oriented to self) Current Attention Level: Focused Memory: Decreased short-term memory;Decreased recall of precautions Following Commands: Follows one step commands inconsistently Safety/Judgement: Decreased awareness of safety;Decreased awareness of deficits Awareness: Intellectual Problem Solving: Slow processing;Decreased initiation;Difficulty sequencing;Requires verbal cues;Requires tactile cues General Comments: Pt more alert today and redirects well to his name.  Pt appeared to follow more 1 step commands today.    Exercises     General Comments General comments (skin integrity, edema, etc.): Pt was very hard to arouse at beginning of session even with hard sternal rub. Pt requires requires constant supervision with mitts off as he tries to pull at lines. Pt was not on monitor upon entry to room, but spot checked SpO2 and it was 98% on room air.  Deferred leaving pt up in chair due to safety concerns as he would slide out of the bottom.       Pertinent Vitals/Pain Pain Assessment: Faces Faces Pain Scale: No hurt    Home Living Family/patient expects to be discharged to:: Skilled nursing facility               Additional Comments: Pt is homeless according to chart. No family present to provide history or PLOF    Prior Function Level of Independence: Independent      Comments: PLOF is unknown, no family present.    PT Goals  (current goals can now be found in the care plan section) Acute Rehab PT Goals Patient Stated Goal: none stated PT Goal Formulation: Patient unable to participate in goal setting Time For Goal Achievement: 03/22/16 Potential to Achieve Goals: Fair Progress towards PT goals: Progressing toward goals    Frequency  Min 3X/week    PT Plan Current plan remains appropriate    Co-evaluation             End of Session Equipment Utilized During Treatment: Gait belt Activity Tolerance: Patient tolerated treatment well Patient left: in bed;with call bell/phone within reach;with chair alarm set     Time: 1137-1202 PT Time Calculation (min) (ACUTE ONLY): 25 min  Charges:  $Gait Training: 8-22 mins $Therapeutic Activity: 8-22 mins                    G Codes:      Everlean CherryJenna Ilianna Bown, SPT Everlean CherryJenna Jakaree Pickard 03/10/2016, 1:50 PM

## 2016-03-10 NOTE — Progress Notes (Signed)
[facility] Anticipated discharge date: 4/6 Family notified: [name] (wife) [facility] by [facility]- scheduled at 2:30pm  CSW signing off.  Merlyn LotJenna Holoman, LCSWA Clinical Social Worker [phone]

## 2016-03-10 NOTE — Progress Notes (Signed)
Occupational Therapy Treatment Patient Details Name: Bonnita HollowCraig D Paredez MRN: [id number] DOB: 06/29/1966 Today's Date: 03/10/2016    History of present illness 50 y/o reportedly homeless male, smoker, with PMH of ETOH abuse admitted 3/14 for worsening upper respiratory symptoms. Developed progressive confusion, lactic acidosis, AKI. 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 Suffered cardiac arrest, cardiogenic shock with acute systolic CHF, ARDS 3/15. Trach 3/24.   OT comments  Pt making slow progress with OT.  Pt following some simple commands but not all. Pt able to count to 10 with therapist when doing exercises but otherwise was unintelligible. Pt sat on EOB with min guard to total assist at times for appx 10 min while attempting grooming tasks. Pt continues to remain total assist for adls at this time and will need SNF at d/c.   Follow Up Recommendations  SNF;Supervision/Assistance - 24 hour    Equipment Recommendations  Hospital bed;Wheelchair cushion (measurements OT);Wheelchair (measurements OT);3 in 1 bedside comode    Recommendations for Other Services      Precautions / Restrictions Precautions Precautions: Fall Precaution Comments: pt no longer with trach or feeding tube. Restrictions Weight Bearing Restrictions: No Other Position/Activity Restrictions: Pt with B hand mitts       Mobility Bed Mobility Overal bed mobility: +2 for physical assistance Bed Mobility: Supine to Sit;Sit to Supine     Supine to sit: Mod assist;HOB elevated Sit to supine: Max assist;HOB elevated   General bed mobility comments: Pt was able to start moving legs off the bed to get up and into the bed to lay back down but could not follow through.    Transfers                 General transfer comment: not appropritate at ths time    Balance Overall balance assessment: Needs assistance Sitting-balance support: Feet supported;Bilateral upper extremity supported Sitting balance-Leahy Scale:  Poor Sitting balance - Comments: Pt sat EOB for appx 6 minutes at times (8-10 sec) with min guard and at other times required total assist.                           ADL Overall ADL's : Needs assistance/impaired Eating/Feeding: Total assistance;Sitting Eating/Feeding Details (indicate cue type and reason): Pt did assist in feeding self with hand over hand assist.  Pt with attn span to short to be able to take one full bite without assist.  pt would hold to fork for 3-5 seconds and then let go. Grooming: Wash/dry face;Oral care;Total assistance;Sitting;Cueing for sequencing Grooming Details (indicate cue type and reason): With hand over hand assist pt could wash face.  Pt would fully attend to task for 1-2 seconds and then stop and require full hand over hand assist to continue.                               Functional mobility during ADLs: Moderate assistance (in bed) General ADL Comments: Pt attempted to talk to therapist during session but was not understandable.  Pt was total assist for adls.  If therapist initiated task for pt, pt would follow through for 1-2 seconds and then require total assist again.  Pt was able to count to ten with therapist when doing UE exercises.       Vision                 Additional Comments: unsure about vision  at this time.  Pt does look at therapist when name is called most of the time and identified correct number of fingers being held up 2/3 attempts.   Perception     Praxis      Cognition   Behavior During Therapy: Flat affect Overall Cognitive Status: Difficult to assess Area of Impairment: Orientation;Attention;Memory;Following commands;Safety/judgement;Awareness;Problem solving Orientation Level:  (unsure.  He is oriented to self.) Current Attention Level: Focused Memory: Decreased short-term memory;Decreased recall of precautions  Following Commands: Follows one step commands inconsistently Safety/Judgement:  Decreased awareness of safety;Decreased awareness of deficits Awareness: Intellectual Problem Solving: Slow processing;Decreased initiation;Difficulty sequencing;Requires verbal cues;Requires tactile cues General Comments: Pt more alert today and redirects well to his name.  Pt appeared to follow more 1 step commands today.    Extremity/Trunk Assessment  Upper Extremity Assessment Upper Extremity Assessment: Difficult to assess due to impaired cognition RUE Deficits / Details: pt required AAROM to more shoulders and elbows LUE Deficits / Details: Pt required AAROM To move shouldes and elbows.   Lower Extremity Assessment Lower Extremity Assessment: Defer to PT evaluation        Exercises General Exercises - Upper Extremity Shoulder Flexion: AAROM;Both;10 reps;Seated Shoulder Extension: AAROM;Both;10 reps;Seated Elbow Flexion: AAROM;Both;10 reps;Seated Elbow Extension: AAROM;Both;10 reps;Seated Wrist Flexion: AAROM;10 reps;Both;Seated Wrist Extension: AAROM;Both;10 reps;Seated Digit Composite Flexion: AAROM;Both;10 reps;Seated Composite Extension: AAROM;Both;10 reps;Seated   Shoulder Instructions       General Comments      Pertinent Vitals/ Pain       Pain Assessment: Faces Faces Pain Scale: No hurt  Home Living Family/patient expects to be discharged to:: Skilled nursing facility                                 Additional Comments: Pt is homeless according to chart. No family present to provide history or PLOF      Prior Functioning/Environment Level of Independence: Independent        Comments: PLOF is unknown, no family present.    Frequency Min 2X/week     Progress Toward Goals  OT Goals(current goals can now be found in the care plan section)  Progress towards OT goals: Progressing toward goals  Acute Rehab OT Goals Patient Stated Goal: none stated OT Goal Formulation: Patient unable to participate in goal setting Time For Goal  Achievement: 03/22/16 Potential to Achieve Goals: Good ADL Goals Pt Will Perform Eating: with mod assist;sitting;with adaptive utensils Pt Will Perform Grooming: with mod assist;sitting Pt Will Perform Upper Body Bathing: with mod assist;sitting Additional ADL Goal #1: Pt will follow 2 step command with adl Additional ADL Goal #2: Pt will static sit EOB for 10 minutes mod (A) level   Plan Discharge plan remains appropriate    Co-evaluation                 End of Session     Activity Tolerance Patient tolerated treatment well   Patient Left in bed;with call bell/phone within reach;with restraints reapplied   Nurse Communication Mobility status;Precautions        Time: 9604-5409 OT Time Calculation (min): 25 min  Charges: OT General Charges $OT Visit: 1 Procedure OT Treatments $Self Care/Home Management : 23-37 mins  Ares, Cardozo 03/10/2016, 11:06 AM  [phone]

## 2016-03-10 NOTE — Discharge Summary (Signed)
Physician Discharge Summary  Dale [name] MRN: [id number] DOB/AGE: 04/02/1966 50 y.o.  PCP: No primary care provider on file.   Admit date: 02/16/2016 Discharge date: 03/10/2016  Discharge Diagnoses:     Principal Problem:   Meningitis, streptococcal Active Problems:   Trichomonal urethritis in male   Homeless   Tobacco abuse   AKI (acute kidney injury) ([place])   Gram positive sepsis (HCC)   Encephalopathy, metabolic   Underweight   Septic shock (HCC)   Cardiogenic shock (HCC)   Acute systolic heart failure (HCC)   Cardiac arrest (HCC)   Hydrocephalus   Altered mental status   CHF (congestive heart failure) (HCC)   Acute respiratory failure with hypoxia (HCC)   Respiratory failure ([place])   Tracheostomy care ([place])   Encounter for palliative care   Goals of care, counseling/discussion   Tracheostomy status ([place]) Strep pneumonia meningitis -     Follow-up recommendations Follow-up with PCP in 3-5 days , including all  additional recommended appointments as below Follow-up CBC, CMP in 3-5 days Strict aspiration precautions Pen G 2.4 million units IM weekly until 4/10  Diet recommendations: Dysphagia 1 (puree);Nectar-thick liquid Liquids provided via: Cup Medication Administration: Crushed with puree Supervision: Staff to assist with self feeding;Full supervision/cueing for compensatory strategies Compensations: Minimize environmental distractions;Slow rate;Small sips/bites Postural Changes and/or Swallow Maneuvers: Seated upright 90 degrees    Medication List    STOP taking these medications        Fish Oil 1000 MG Caps     methocarbamol 500 MG tablet  Commonly known as:  ROBAXIN     naproxen 500 MG tablet  Commonly known as:  NAPROSYN     oseltamivir 75 MG capsule  Commonly known as:  TAMIFLU      TAKE these medications        acetaminophen 160 MG/5ML solution  Commonly known as:  TYLENOL  Take 20.3 mLs (650 mg total) by mouth every 6 (six) hours as  needed for mild pain, headache or fever.     albuterol (2.5 MG/3ML) 0.083% nebulizer solution  Commonly known as:  PROVENTIL  Take 3 mLs (2.5 mg total) by nebulization every 3 (three) hours as needed for wheezing.     amiodarone 200 MG tablet  Commonly known as:  PACERONE  Take 1 tablet (200 mg total) by mouth daily.     aspirin 81 MG chewable tablet  Chew 1 tablet (81 mg total) by mouth daily.     carvedilol 6.25 MG tablet  Commonly known as:  COREG  Take 1 tablet (6.25 mg total) by mouth 2 (two) times daily with a meal.     folic acid 1 MG tablet  Commonly known as:  FOLVITE  Take 1 tablet (1 mg total) by mouth daily.     pantoprazole sodium 40 mg/20 mL Pack  Commonly known as:  PROTONIX  Take 20 mLs (40 mg total) by mouth daily.     RESOURCE THICKENUP CLEAR Powd  With meals     thiamine 100 MG tablet  Take 1 tablet (100 mg total) by mouth daily.     traMADol 50 MG tablet  Commonly known as:  ULTRAM  Take 1 tablet (50 mg total) by mouth every 6 (six) hours as needed.         Discharge Condition: Prognosis poor from a neurological standpoint by neurology   Discharge Instructions Get Medicines reviewed and adjusted: Please take all your medications with you for your next visit  with your Primary MD  Please request your Primary MD to go over all hospital tests and procedure/radiological results at the follow up, please ask your Primary MD to get all Hospital records sent to his/her office.  If you experience worsening of your admission symptoms, develop shortness of breath, life threatening emergency, suicidal or homicidal thoughts you must seek medical attention immediately by calling 911 or calling your MD immediately if symptoms less severe.  You must read complete instructions/literature along with all the possible adverse reactions/side effects for all the Medicines you take and that have been prescribed to you. Take any new Medicines after you have completely  understood and accpet all the possible adverse reactions/side effects.   Do not drive when taking Pain medications.   Do not take more than prescribed Pain, Sleep and Anxiety Medications  Special Instructions: If you have smoked or chewed Tobacco in the last 2 yrs please stop smoking, stop any regular Alcohol and or any Recreational drug use.  Wear Seat belts while driving.  Please note  You were cared for by a hospitalist during your hospital stay. Once you are discharged, your primary care physician will handle any further medical issues. Please note that NO REFILLS for any discharge medications will be authorized once you are discharged, as it is imperative that you return to your primary care physician (or establish a relationship with a primary care physician if you do not have one) for your aftercare needs so that they can reassess your need for medications and monitor your lab values.      Discharge Instructions    Diet - low sodium heart healthy    Complete by:  As directed      Increase activity slowly    Complete by:  As directed            No Known Allergies    Disposition: 01-Home or Self Care   Consults:   Pulmonary Infectious disease Cardiology     Significant Diagnostic Studies:  Dg Chest 1 View  03/05/2016  CLINICAL DATA:  Hypoxia. EXAM: CHEST 1 VIEW COMPARISON:  March 03, 2016. FINDINGS: Stable cardiomediastinal silhouette. Tracheostomy and nasogastric tubes are unchanged in position. Right-sided PICC line is stable. No pneumothorax is noted. Improved bibasilar opacities are noted consistent with improving pulmonary edema. Bony thorax is unremarkable. IMPRESSION: Stable support apparatus. Improved bibasilar opacities consistent with improving edema. Electronically Signed   By: Marijo Conception, M.D.   On: 03/05/2016 11:58   Dg Chest 2 View  02/13/2016  CLINICAL DATA:  50 year old male with history of anterior chest pain for the past 2 days. Low-grade  fever. Dizziness. EXAM: CHEST  2 VIEW COMPARISON:  Chest x-ray 06/01/2009. FINDINGS: Lung volumes are normal. No consolidative airspace disease. No pleural effusions. No pneumothorax. No pulmonary nodule or mass noted. Pulmonary vasculature and the cardiomediastinal silhouette are within normal limits. IMPRESSION: No radiographic evidence of acute cardiopulmonary disease. Electronically Signed   By: Vinnie Langton M.D.   On: 02/13/2016 17:57   Dg Thoracic Spine 2 View  02/13/2016  CLINICAL DATA:  O thoracic and lower back pain for 1 week. Fall 2 days ago. Pain was before the fall. EXAM: THORACIC SPINE 2 VIEWS COMPARISON:  Chest x-ray today and 06/01/2009 FINDINGS: There is no evidence of thoracic spine fracture. Alignment is normal. No other significant bone abnormalities are identified. IMPRESSION: Negative. Electronically Signed   By: Rolm Baptise M.D.   On: 02/13/2016 20:45   Dg Lumbar Spine  Complete  02/13/2016  CLINICAL DATA:  Low back pain and recent fall. EXAM: LUMBAR SPINE - COMPLETE 4+ VIEW COMPARISON:  08/25/2014 lumbar spine radiographs. FINDINGS: This report assumes 5 non rib-bearing lumbar vertebrae. Lumbar vertebral body heights are preserved, with no fracture. There is mild loss of disc height at L4-5 with associated minimal spondylosis, not appreciably changed. Otherwise preserved lumbar disc spaces. There is new 3 mm anterolisthesis at L4-5. No appreciable facet arthropathy. No appreciable foraminal stenosis. No aggressive appearing focal osseous lesions. IMPRESSION: 1. No lumbar spine fracture. 2. Stable mild degenerative disc disease at L4-5. 3. New mild 3 mm anterolisthesis at L4-5. Electronically Signed   By: Ilona Sorrel M.D.   On: 02/13/2016 20:47   Ct Head Wo Contrast  02/16/2016  CLINICAL DATA:  Fever and altered mental status; tremors EXAM: CT HEAD WITHOUT CONTRAST TECHNIQUE: Contiguous axial images were obtained from the base of the skull through the vertex without intravenous  contrast. COMPARISON:  June 01, 2009 FINDINGS: The ventricles are mildly enlarged in a generalized manner and appear larger compared to the prior study. The sulci appear unremarkable. There is no intracranial mass, hemorrhage, extra-axial fluid collection, or midline shift. No focal gray-white compartment lesions are identified. No evidence of acute infarct. Bony calvarium appears intact. The mastoid air cells are clear. There is opacification in the left maxillary antrum as well as in multiple ethmoid air cells bilaterally. There is mild mucosal thickening in the left sphenoid sinus region. No intraorbital lesions are apparent. IMPRESSION: Ventricles mildly enlarged with sulci appearing normal. This appearance potentially could represent a degree of normal pressure hydrocephalus. This finding also could be secondary to prior or recent meningitis meningitis. No intracranial mass, hemorrhage, or extra-axial fluid collection. No acute infarct evident. Multifocal sinusitis. Given these current findings, brain MRI pre and post-contrast may be helpful to further assess, in particular to further evaluate the meningeal regions. Electronically Signed   By: Lowella Grip III M.D.   On: 02/16/2016 14:04   Dg Chest Port 1 View  03/03/2016  CLINICAL DATA:  Shortness of breath. EXAM: PORTABLE CHEST 1 VIEW COMPARISON:  02/29/2016. FINDINGS: Tracheostomy tube, NG tube, right PICC line in stable position. Mediastinum hilar structures normal. Heart size stable. Diffuse bilateral airspace disease. Basilar atelectasis. Small bilateral pleural effusions. IMPRESSION: 1. Lines and tubes in stable position. 2. Bilateral airspace disease consistent with bilateral pulmonary infiltrates/edema. Low lung volumes with basilar atelectasis. Small bilateral pleural effusions. Similar findings noted on prior exam. Electronically Signed   By: Marcello Moores  Register   On: 03/03/2016 07:14   Dg Chest Port 1 View  02/29/2016  CLINICAL DATA:   50 year old male with acute respiratory failure EXAM: PORTABLE CHEST 1 VIEW COMPARISON:  Prior chest x-ray 02/28/2016 FINDINGS: The tracheostomy tube remains midline and at the level of the clavicles. A right upper extremity approach PICC terminates in good position overlying the superior cavoatrial junction. A nasogastric tube is present. The tip lies off the field of view but below the diaphragm and presumably within the stomach. Stable cardiac and mediastinal contours. Bilateral right larger than left layering pleural effusions have slightly enlarged. There is associated bibasilar atelectasis. Background of mild pulmonary vascular congestion. No pneumothorax. No acute osseous abnormality. IMPRESSION: 1. Slight interval progression of right greater than left layering pleural effusions and associated bibasilar atelectasis. 2. Stable and satisfactory support apparatus. Electronically Signed   By: Jacqulynn Cadet M.D.   On: 02/29/2016 07:11   Dg Chest Port 1 View  02/28/2016  CLINICAL DATA:  Acute respiratory failure EXAM: PORTABLE CHEST 1 VIEW COMPARISON:  02/26/2016 FINDINGS: Cardiomediastinal silhouette is stable. There is tracheostomy tube in place. Endotracheal tube has been removed. NG tube is unchanged in position. No pulmonary edema. Right arm PICC line with tip in SVC right atrium junction. Mild bilateral basilar hazy atelectasis or early infiltrate. IMPRESSION: Tracheostomy tube in place. Stable NG tube position. Right arm PICC line with tip in SVC right atrium junction. Mild basilar atelectasis or infiltrate. No pulmonary edema. Electronically Signed   By: Lahoma Crocker M.D.   On: 02/28/2016 09:52   Dg Chest Portable 1 View  02/26/2016  CLINICAL DATA:  ARDS EXAM: PORTABLE CHEST 1 VIEW COMPARISON:  02/25/2016 FINDINGS: Cardiac shadow is stable. A nasogastric catheter and endotracheal tube are again seen in stable position. The lungs are well aerated bilaterally. Persistent left retrocardiac atelectasis  and small effusion are noted. No new focal abnormality is seen. IMPRESSION: Stable changes in the left base. Electronically Signed   By: Inez Catalina M.D.   On: 02/26/2016 07:24   Dg Chest Port 1 View  02/25/2016  CLINICAL DATA:  Intubated patient, encephalopathy and meningitis, septic shock, CHF. EXAM: PORTABLE CHEST 1 VIEW COMPARISON:  Portable chest x-ray of February 24, 2016 FINDINGS: The lungs are well-expanded. There is persistent left lower lobe atelectasis or pneumonia and trace left pleural effusion. The right lung is clear. The heart and pulmonary vascularity are normal. The endotracheal tube tip lies approximately 2 cm above the carina. The esophagogastric tube tip projects below the inferior margin of the image. IMPRESSION: Somewhat low positioning of the endotracheal tube with. Withdrawal by 2 cm is recommended to avoid accidental mainstem bronchus intubation. Stable left lower lobe atelectasis and small left pleural effusion. Electronically Signed   By: David  Martinique M.D.   On: 02/25/2016 07:24   Dg Chest Port 1 View  02/24/2016  CLINICAL DATA:  Endotracheal tube assessment. EXAM: PORTABLE CHEST 1 VIEW COMPARISON:  02/23/2016 FINDINGS: Endotracheal tube is present with tip measuring 2.5 cm above the carinal. Enteric tube tip is off the field of view but below the left hemidiaphragm. Shallow inspiration. Heart size and pulmonary vascularity are normal. Mild serration of left hemidiaphragm suggesting infiltration and/ or effusion. No pneumothorax. IMPRESSION: Endotracheal tube tip measures 2.5 cm above the carina. Infiltration and/ or effusion in the left lung base. Electronically Signed   By: Lucienne Capers M.D.   On: 02/24/2016 01:34   Dg Chest Port 1 View  02/23/2016  CLINICAL DATA:  Shortness of breath. EXAM: PORTABLE CHEST 1 VIEW COMPARISON:  02/22/2016. FINDINGS: Endotracheal tube tip 1.8 cm above the carina, slight proximal repositioning of 1 a 2 cm should be considered. NG tube noted with  tip below left hemidiaphragm. Heart size stable. Bilateral pulmonary infiltrates, left side greater right noted. Small left pleural effusion cannot be excluded. No pneumothorax . IMPRESSION: 1. Endotracheal tube tip 1.8 cm above the carina, slight proximal repositioning of 1 a 2 cm should be considered. NG tube in stable position. 2. Diffuse left lung and right lower lobe pulmonary infiltrates noted. These are new from prior exam. Small left pleural effusion cannot be excluded . Electronically Signed   By: Marcello Moores  Register   On: 02/23/2016 07:31   Dg Chest Port 1 View  02/22/2016  CLINICAL DATA:  Respiratory failure. EXAM: PORTABLE CHEST 1 VIEW COMPARISON:  02/21/2016. FINDINGS: Endotracheal tube, NG tube, right IJ sheath in stable position. Interim removal Swan-Ganz catheter. Heart size stable.  Low lung volumes with mild bibasilar atelectasis and or infiltrates. Interim improvement from prior exam. Small left pleural effusion cannot be excluded. No pneumothorax. IMPRESSION: 1. Level of Swan-Ganz catheter. Endotracheal tube, NG tube, right IJ sheath in stable position. 2. Low lung volumes with mild bibasilar atelectasis and/or infiltrates. Interim improvement from prior exam. Small left pleural effusion cannot be excluded. Electronically Signed   By: Marcello Moores  Register   On: 02/22/2016 07:13   Dg Chest Port 1 View  02/21/2016  CLINICAL DATA:  Respiratory failure EXAM: PORTABLE CHEST 1 VIEW COMPARISON:  Chest radiograph from one day prior. FINDINGS: Right internal jugular Swan-Ganz catheter terminates in the right infrahilar region. Enteric tube enters the stomach with the tip not seen on this image. Endotracheal tube tip is 2.5 cm above the carina. Stable cardiomediastinal silhouette with normal heart size. No pneumothorax. Stable small left pleural effusion. No pulmonary edema. Stable left lower lobe consolidation. IMPRESSION: 1. Well-positioned endotracheal tube. Right internal jugular Swan-Ganz catheter  terminates over the right infrahilar region, consider retracting 2-3 cm. 2. Stable small left pleural effusion. 3. Stable left lower lobe consolidation, favor atelectasis. Electronically Signed   By: Ilona Sorrel M.D.   On: 02/21/2016 08:00   Dg Chest Port 1 View  02/20/2016  CLINICAL DATA:  CHF EXAM: PORTABLE CHEST 1 VIEW COMPARISON:  Chest radiograph from one day prior. FINDINGS: Right internal jugular Swan-Ganz catheter terminates over the central right lower lung, unchanged. Endotracheal tube tip is 2.4 cm above the carina. Enteric tube enters the stomach with the tip not seen on this image. Intra-aortic balloon pump marker terminates over the proximal descending thoracic aorta at the T7-8 level. Stable cardiomediastinal silhouette with normal heart size. No pneumothorax. No pleural effusion. Hazy opacities throughout the bilateral parahilar lungs are not appreciably changed. Probable complete left lower lobe atelectasis, unchanged. IMPRESSION: 1. Right internal jugular Swan-Ganz catheter terminates over the central lower right lung, consider retracting 3-4 cm . 2. Otherwise well-positioned support structures as described. 3. Stable hazy opacities throughout the parahilar lungs, favor pulmonary edema. Electronically Signed   By: Ilona Sorrel M.D.   On: 02/20/2016 09:34   Dg Chest Port 1 View  02/19/2016  CLINICAL DATA:  CHF EXAM: PORTABLE CHEST 1 VIEW COMPARISON:  02/18/2016 FINDINGS: Support devices remain in place, unchanged. Heart is normal size. Diffuse interstitial and alveolar opacities are again noted, likely edema. This is somewhat more confluent in both lung bases, left greater than right. Cannot completely exclude infiltrate at the left base. No visible effusions. No acute bony abnormality. IMPRESSION: Continued diffuse interstitial and alveolar opacities, likely edema. Somewhat more focal opacities in the lung bases, left greater than right. Cannot exclude pneumonia. Electronically Signed   By:  Rolm Baptise M.D.   On: 02/19/2016 08:29   Dg Chest Port 1 View  02/18/2016  CLINICAL DATA:  Entry aortic balloon pump asist. EXAM: PORTABLE CHEST 1 VIEW COMPARISON:  February 17, 2016. FINDINGS: The heart size and mediastinal contours are within normal limits. No pneumothorax or pleural effusion is noted. Endotracheal nasogastric tubes are unchanged in position. Right internal jugular Swan-Ganz catheter is unchanged with distal tip in expected position of right pulmonary artery. Distal tip of aortic balloon catheter is seen over expected position of superior portion of descending thoracic aorta just below aortic knob. Stable bilateral perihilar opacities are noted concerning for edema or inflammation. The visualized skeletal structures are unremarkable. IMPRESSION: Distal tip of aortic balloon catheter appears to be slightly more inferior in  position compared to prior exam, just below aortic knob currently. This projects over the expected position of the superior aspect of descending thoracic aorta. Stable bilateral perihilar opacities are noted concerning for edema or inflammation. Otherwise stable support apparatus. Electronically Signed   By: Marijo Conception, M.D.   On: 02/18/2016 07:24   Dg Chest Port 1 View  02/17/2016  CLINICAL DATA:  Ventilator dependence. EXAM: PORTABLE CHEST 1 VIEW COMPARISON:  Earlier the same day. FINDINGS: 1822 hours. Endotracheal tube tip is 7.4 cm above the base of the carina. Right IJ pulmonary artery catheter tip is in the interlobar pulmonary artery. The tip of the intra-aortic balloon pump projects over the inferior aspect of the transverse aorta. The NG tube passes into the stomach although the distal tip position is not included on the film. Proximal port of the NG tube is at or just above the EG junction. Diffuse interstitial and central alveolar opacities suggest edema. Telemetry leads overlie the chest. IMPRESSION: The tip of the intra-aortic balloon pump projects over the  inferior aspect of the transverse aorta. Electronically Signed   By: Misty Stanley M.D.   On: 02/17/2016 18:34   Dg Chest Port 1 View  02/17/2016  CLINICAL DATA:  50 year old male with a history of Swan-Ganz catheter placement. EXAM: PORTABLE CHEST 1 VIEW COMPARISON:  02/17/2016 FINDINGS: Cardiomediastinal silhouette unchanged in size and contour. Skull pads again project over the thorax. Unchanged endotracheal tube, gastric tube. Interval placement of right IJ sheath, through which a Swan-Ganz catheter has been placed terminating in the right pulmonary arteries. Persisting bilateral airspace opacity IMPRESSION: Interval placement of right IJ sheath, through which a Swan-Ganz catheter has been placed and terminates in the right pulmonary arteries at the hilum. Similar appearance of bilateral airspace opacities, potentially infection and/or edema. Unchanged support apparatus as above. Signed, Dulcy Fanny. Earleen Newport, DO [facility] Electronically Signed   By: Corrie Mckusick D.O.   On: 02/17/2016 13:53   Dg Chest Port 1 View  02/17/2016  CLINICAL DATA:  Hypoxia EXAM: PORTABLE CHEST 1 VIEW COMPARISON:  February 16, 2016 FINDINGS: Endotracheal tube tip is 3.5 cm above the carina. Nasogastric tube tip and side port are in the stomach. No pneumothorax evident. There is interstitial edema throughout the lungs bilaterally, perhaps slightly increased from 1 day prior. No airspace consolidation. Heart is upper normal in size with pulmonary vascularity within normal limits. No adenopathy. IMPRESSION: Tube positions as described without pneumothorax. Slight increase in interstitial edema. No airspace consolidation. No change in cardiac silhouette. Electronically Signed   By: Lowella Grip III M.D.   On: 02/17/2016 07:31   Dg Chest Portable 1 View  02/16/2016  CLINICAL DATA:  Endotracheal tube placement EXAM: PORTABLE CHEST 1 VIEW COMPARISON:  Chest x-ray from earlier  same day. FINDINGS: Endotracheal tube well positioned with tip approximately 2 cm above the carina. Enteric tube passes below the diaphragm. Heart size is normal. There is central pulmonary vascular congestion and mild interstitial edema. Suspect small left pleural effusion. No pneumothorax. Osseous structures about the chest are unremarkable. IMPRESSION: 1. Endotracheal tube well positioned with tip approximately 2 cm above the carina. 2. Central pulmonary vascular congestion and mild bilateral interstitial edema suggesting mild volume overload/resuscitation efforts. No frank alveolar pulmonary edema. Electronically Signed   By: Franki Cabot M.D.   On: 02/16/2016 19:38   Dg Chest Port 1 View  02/16/2016  CLINICAL DATA:  Altered mental status.  Depression. EXAM: PORTABLE CHEST  1 VIEW COMPARISON:  02/13/2016. FINDINGS: Midline trachea. Normal heart size and mediastinal contours. Left costophrenic angle exclusion. No pleural effusion or pneumothorax. Clear lungs. IMPRESSION: No active disease. Electronically Signed   By: Abigail Miyamoto M.D.   On: 02/16/2016 13:48   Dg Abd Portable 1v  02/26/2016  CLINICAL DATA:  Nasogastric tube placement EXAM: PORTABLE ABDOMEN - 1 VIEW COMPARISON:  None. FINDINGS: Nasogastric tube tip is near the pylorus with the side port in the gastric antrum region. Bowel gas pattern is unremarkable. No free air evident. There is consolidation in the medial left lung base. IMPRESSION: Nasogastric tube tip and side port in distal stomach. Bowel gas pattern unremarkable. Medial left lung base consolidation. Electronically Signed   By: Lowella Grip III M.D.   On: 02/26/2016 16:40   Dg Swallowing Func-speech Pathology  03/07/2016  Objective Swallowing Evaluation: Type of Study: MBS-Modified Barium Swallow Study Patient Details Name: Dale [name] MRN: [id number] Date of Birth: Dec 29, 1965 Today's Date: 03/07/2016 Time: SLP Start Time (ACUTE ONLY): 0928-SLP Stop Time (ACUTE ONLY): 6967 SLP Time  Calculation (min) (ACUTE ONLY): 24 min Past Medical History: Past Medical History Diagnosis Date . Depression  Past Surgical History: Past Surgical History Procedure Laterality Date . Ankle surgery   . Cardiac catheterization N/A 02/17/2016   Procedure: IABP Insertion;  Surgeon: Wellington Hampshire, MD;  Location: [place];  Service: Cardiovascular;  Laterality: N/A; HPI: 50 y.o. male with h/o depression who initially presented to [facility] ED 3/11 with URI symptoms and was d/c with Tamiflu. Family brought pt to ED 3/14 with leukocytosis, fever of 103, decreased consciousness and minimally responsive. 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 Pt intubated in ED, trach placed 3/24 due to inability to wean off vent. No Data Recorded Assessment / Plan / Recommendation CHL IP CLINICAL IMPRESSIONS 03/07/2016 Therapy Diagnosis Mild oral phase dysphagia;Mild pharyngeal phase dysphagia Clinical Impression MBS complete with PMSV donned throughout assessment. Pt exhibited mild oropharyngeal dysphagia during this study due to sensory deficits. Weak lingual manipulation resulted in decreased bolus cohesion. Delayed swallow initiation to the vallecula and pyriform sinuses observed, resulting in silent penetration above the vocal cords x1 with intake of thin liquids. No compensatory strategies attempted due to altered mental status and inability to comply. No airway compromise noted for all other consistencies and no pharyngeal residue observed. Due to pt's decreased mental status, recommend Dysphagia 1 (puree) textures to prevent pocketing with nectar thick liquids (no straws) and meds crushed in puree, with PMSV donned. Pt and wife educated re: diet recommendation and PMSV use/precautions. SLP will f/u to determine diet tolerance and advise diet tolerance. Impact on safety and function (No Data)   CHL IP TREATMENT RECOMMENDATION 03/07/2016 Treatment Recommendations  Therapy as outlined in treatment plan below   Prognosis 03/07/2016 Prognosis for Safe Diet Advancement Good Barriers to Reach Goals Severity of deficits;Time post onset Barriers/Prognosis Comment -- CHL IP DIET RECOMMENDATION 03/07/2016 SLP Diet Recommendations Dysphagia 1 (Puree) solids;Nectar thick liquid Liquid Administration via Cup;No straw Medication Administration Crushed with puree Compensations Minimize environmental distractions;Slow rate;Small sips/bites Postural Changes Seated upright at 90 degrees   CHL IP OTHER RECOMMENDATIONS 03/07/2016 Recommended Consults -- Oral Care Recommendations Oral care BID Other Recommendations Order thickener from pharmacy;Prohibited food (jello, ice cream, thin soups);Remove water pitcher;Have oral suction available;Place PMSV during PO intake   CHL IP FOLLOW UP RECOMMENDATIONS 03/07/2016 Follow up Recommendations (No Data)   CHL IP FREQUENCY AND DURATION  03/07/2016 Speech Therapy Frequency (ACUTE ONLY) min 2x/week Treatment Duration 2 weeks      CHL IP ORAL PHASE 03/07/2016 Oral Phase Impaired Oral - Pudding Teaspoon -- Oral - Pudding Cup -- Oral - Honey Teaspoon -- Oral - Honey Cup -- Oral - Nectar Teaspoon -- Oral - Nectar Cup Decreased bolus cohesion;Weak lingual manipulation Oral - Nectar Straw (No Data) Oral - Thin Teaspoon -- Oral - Thin Cup Decreased bolus cohesion;Weak lingual manipulation Oral - Thin Straw -- Oral - Puree Decreased bolus cohesion;Weak lingual manipulation Oral - Mech Soft -- Oral - Regular -- Oral - Multi-Consistency -- Oral - Pill -- Oral Phase - Comment --  CHL IP PHARYNGEAL PHASE 03/07/2016 Pharyngeal Phase Impaired Pharyngeal- Pudding Teaspoon -- Pharyngeal -- Pharyngeal- Pudding Cup -- Pharyngeal -- Pharyngeal- Honey Teaspoon -- Pharyngeal -- Pharyngeal- Honey Cup -- Pharyngeal -- Pharyngeal- Nectar Teaspoon -- Pharyngeal -- Pharyngeal- Nectar Cup Delayed swallow initiation-pyriform sinuses;Penetration/Aspiration during swallow Pharyngeal Material enters  airway, remains ABOVE vocal cords and not ejected out Pharyngeal- Nectar Straw (No Data) Pharyngeal -- Pharyngeal- Thin Teaspoon -- Pharyngeal -- Pharyngeal- Thin Cup Penetration/Aspiration during swallow;Delayed swallow initiation-pyriform sinuses Pharyngeal Material enters airway, remains ABOVE vocal cords and not ejected out Pharyngeal- Thin Straw -- Pharyngeal -- Pharyngeal- Puree Delayed swallow initiation-vallecula Pharyngeal -- Pharyngeal- Mechanical Soft -- Pharyngeal -- Pharyngeal- Regular -- Pharyngeal -- Pharyngeal- Multi-consistency -- Pharyngeal -- Pharyngeal- Pill -- Pharyngeal -- Pharyngeal Comment --  CHL IP CERVICAL ESOPHAGEAL PHASE 03/07/2016 Cervical Esophageal Phase WFL Pudding Teaspoon -- Pudding Cup -- Honey Teaspoon -- Honey Cup -- Nectar Teaspoon -- Nectar Cup -- Nectar Straw -- Thin Teaspoon -- Thin Cup -- Thin Straw -- Puree -- Mechanical Soft -- Regular -- Multi-consistency -- Pill -- Cervical Esophageal Comment -- No flowsheet data found. [place] Siren 03/07/2016, 11:17 AM Orbie Pyo Colvin Caroli.Ed CCC-SLP Pager [phone]              Ct Portable Head W/o Cm  02/17/2016  CLINICAL DATA:  50 year old male with a history of hydrocephalus. Acute meningitis. EXAM: CT HEAD WITHOUT CONTRAST TECHNIQUE: Contiguous axial images were obtained from the base of the skull through the vertex without intravenous contrast. COMPARISON:  02/16/2016 FINDINGS: Unremarkable appearance of the calvarium without acute fracture or aggressive lesion. Unremarkable appearance of the scalp soft tissues. Unremarkable appearance of the bilateral orbits. Partially visualized endotracheal tube.  Oral enteric tube. Near complete opacification of the left maxillary sinus with frothy secretions. Trace secretions of the right maxillary sinus. Mucoperiosteal thickening of the ethmoid air cells and the sphenoid sinuses. Trace disease of the frontal sinuses. Mastoid air cells are clear. Layered fluid within the nasopharynx.  Configuration the ventricles is similar to comparison with bifrontal diameter measuring 40 mm, unchanged from prior. Unchanged appearance of the hemispheric sulci. No acute intracranial hemorrhage.  No midline shift. Gray-white differentiation relatively maintained. IMPRESSION: Unchanged ventricular dilation and appearance of the cerebral sulci, with no evidence of acute hemorrhage or infarction. Signed, Dulcy Fanny. Earleen Newport, DO [facility] Radiology Electronically Signed   By: Corrie Mckusick D.O.   On: 02/17/2016 07:23    2-D echo LV EF: 50% - 55%  ------------------------------------------------------------------- Indications: CHF - 428.0.  ------------------------------------------------------------------- History: Risk factors: Current tobacco use.  ------------------------------------------------------------------- Study Conclusions  - Left ventricle: The cavity size was normal. Wall thickness was  normal. Systolic function was at the lower limits of normal. The  estimated ejection fraction was in the range of 50% to 55%. Mild  diffuse hypokinesis with no identifiable regional variations.  Left ventricular diastolic function parameters were normal. - Pericardium, extracardiac: A small pericardial effusion was  identified circumferential to the heart. The fluid had no  internal echoes.There was no evidence of hemodynamic compromise.  Impressions:  - Compared to 02/18/2016, there is marked improvement in LV  function.    Filed Weights   03/09/16 0226 03/10/16 0200 03/10/16 0423  Weight: 56.4 kg (124 lb 5.4 oz) 55.3 kg (121 lb 14.6 oz) 55.4 kg (122 lb 2.2 oz)     Microbiology: Recent Results (from the past 240 hour(s))  Culture, blood (routine x 2)     Status: None   Collection Time: 03/04/16  7:30 AM  Result Value Ref Range Status   Specimen Description BLOOD LEFT ANTECUBITAL  Final   Special Requests  BOTTLES DRAWN AEROBIC AND ANAEROBIC  10CC  Final   Culture NO GROWTH 5 DAYS  Final   Report Status 03/09/2016 FINAL  Final  Culture, blood (routine x 2)     Status: None   Collection Time: 03/04/16  7:40 AM  Result Value Ref Range Status   Specimen Description BLOOD RIGHT HAND  Final   Special Requests BOTTLES DRAWN AEROBIC AND ANAEROBIC  10CC  Final   Culture NO GROWTH 5 DAYS  Final   Report Status 03/09/2016 FINAL  Final  C difficile quick scan w PCR reflex     Status: None   Collection Time: 03/06/16 10:42 AM  Result Value Ref Range Status   C Diff antigen NEGATIVE NEGATIVE Final   C Diff toxin NEGATIVE NEGATIVE Final   C Diff interpretation Negative for toxigenic C. difficile  Final       Blood Culture    Component Value Date/Time   SDES BLOOD RIGHT HAND 03/04/2016 0740   SPECREQUEST BOTTLES DRAWN AEROBIC AND ANAEROBIC  10CC 03/04/2016 0740   CULT NO GROWTH 5 DAYS 03/04/2016 0740   REPTSTATUS 03/09/2016 FINAL 03/04/2016 0740      Labs: Results for orders placed or performed during the hospital encounter of 02/16/16 (from the past 48 hour(s))  Glucose, capillary     Status: None   Collection Time: 03/08/16 12:14 PM  Result Value Ref Range   Glucose-Capillary 88 65 - 99 mg/dL  Glucose, capillary     Status: Abnormal   Collection Time: 03/08/16  4:04 PM  Result Value Ref Range   Glucose-Capillary 102 (H) 65 - 99 mg/dL  Glucose, capillary     Status: None   Collection Time: 03/08/16  8:31 PM  Result Value Ref Range   Glucose-Capillary 78 65 - 99 mg/dL  CBC     Status: Abnormal   Collection Time: 03/09/16  4:49 AM  Result Value Ref Range   WBC 5.2 4.0 - 10.5 K/uL   RBC 3.32 (L) 4.22 - 5.81 MIL/uL   Hemoglobin 9.6 (L) 13.0 - 17.0 g/dL   HCT 30.3 (L) 39.0 - 52.0 %   MCV 91.3 78.0 - 100.0 fL   MCH 28.9 26.0 - 34.0 pg   MCHC 31.7 30.0 - 36.0 g/dL   RDW 14.0 11.5 - 15.5 %   Platelets 432 (H) 150 - 400 K/uL  Comprehensive metabolic panel     Status: Abnormal    Collection Time: 03/09/16  4:49 AM  Result Value Ref Range   Sodium 147 (H) 135 - 145 mmol/L   Potassium 4.2 3.5 - 5.1 mmol/L   Chloride 110 101 - 111 mmol/L   CO2  25 22 - 32 mmol/L   Glucose, Bld 96 65 - 99 mg/dL   BUN 31 (H) 6 - 20 mg/dL   Creatinine, Ser 1.37 (H) 0.61 - 1.24 mg/dL   Calcium 9.1 8.9 - 10.3 mg/dL   Total Protein 6.8 6.5 - 8.1 g/dL   Albumin 2.4 (L) 3.5 - 5.0 g/dL   AST 27 15 - 41 U/L   ALT 45 17 - 63 U/L   Alkaline Phosphatase 59 38 - 126 U/L   Total Bilirubin 0.4 0.3 - 1.2 mg/dL   GFR calc non Af Amer 58 (L) >60 mL/min   GFR calc Af Amer >60 >60 mL/min    Comment: (NOTE) The eGFR has been calculated using the CKD EPI equation. This calculation has not been validated in all clinical situations. eGFR's persistently <60 mL/min signify possible Chronic Kidney Disease.    Anion gap 12 5 - 15  Glucose, capillary     Status: None   Collection Time: 03/09/16  7:47 AM  Result Value Ref Range   Glucose-Capillary 85 65 - 99 mg/dL  Glucose, capillary     Status: None   Collection Time: 03/09/16 12:02 PM  Result Value Ref Range   Glucose-Capillary 97 65 - 99 mg/dL  Glucose, capillary     Status: None   Collection Time: 03/09/16  4:41 PM  Result Value Ref Range   Glucose-Capillary 82 65 - 99 mg/dL  Glucose, capillary     Status: None   Collection Time: 03/09/16 11:01 PM  Result Value Ref Range   Glucose-Capillary 88 65 - 99 mg/dL  Glucose, capillary     Status: None   Collection Time: 03/10/16  7:42 AM  Result Value Ref Range   Glucose-Capillary 79 65 - 99 mg/dL     Lipid Panel     Component Value Date/Time   TRIG 54 02/16/2016 2016     No results found for: HGBA1C   Lab Results  Component Value Date   CREATININE 1.37* 03/09/2016     HPI :  Dale [name] is a 50 y.o. male with limited past med hx of depression and homelessness who presented to the ED with new onset confusion, brought in by family. He previously was seen in the ED on 3/11 with uri  symptoms but also had chest pain that radiated to his back per ED notes, no mention of neck pain. On admission, he was found to be confused, febrile to 104, and physical exam revealing nuchal rigidity. HIs labs also revealed leukocytosis of WBC 18.4K, aki with cr 1.66, AG 19, and LA of 3.45. He was initiated on meningitis coverage with steroids, vanco/ctx plus ampicillin and shortly thereafter underwent LP which showed neutrophilic predominant pleocytosis with WBC of 340, with 88%N, protein>600, glu<20 and gram stain showing GPC in pairs. I spoke to micro lab who confirmed the findings. His infectious work up also had blood cx and urine cx. Within the next 6 hrs, he decompensated and became increasingly obtunded requiring intubation. He still remains in critical condition, persistently febrile with temps 104F with associated tachycardia up to 160s and appears to have demand ischemia vs. ACS with initial trop of 7.58 (previously negative on first ED visit on 3/11). BP maintaining MAP>70  NCHCT showed mild hydrocephaly but appears to have maxillary, ethmoid, sphenoid opacification c/w multifocal sinusitis.CXR not showing any significant infiltrate. After intubation, cxr shows mild cephalization per my read  His visit on 3/11 to the ED shows that he reported  2 days of feeling poorly with chills, nonproductive cough ,N/V/D and some element of pleuretic chest pain. his cxr was unremarkable, but he did have fever of 101, leukocytosis of 13K at the time. He reports having numerous sick contacts. He was treated empirically for influenza but family reports that he did appear to have filed his oseltamivir prescription. He was also treated for trichomonas as part of infectious work up to the fever.no blood cx drawn at the time  In the ED, 3/14  he was intubated for airway protection due to his altered mental status.ARDS 3/15 He became progressively hypotensive, requiring vasoactive medications. He then went into  asystole. ACLS was performed, got epi x1. On first rhythm check, was in ventricular fibrillation and received one 150 J shock with successful restoration of sinus rhythm. Total code time was 5-6 minutes. Around this time, it was found that his troponin was elevated to 7.6, which increased to 13 on repeat 4 hours later.  HOSPITAL COURSE:   S/P Acute respiratory failure 2nd to ARDS, acute pulmonary edema S/P Tracheostomy - 3/24 might be ready for decannulation soon As per pulmonary recommendations on 4/4  Former Tobacco Abuse Pleural effusion on cxr 3/27 Daily trach collar >> off vent since 3/30, improved FiO2 requirements Last chest x-ray 4/1 shows improved bibasilar opacities consistent with improving pulmonary edema infiltrate/edema,  PRN albuterol , incentive spirometry Lasix discontinued because of worsening kidney function Strict aspiration precautions,#6 cuffed trach removed, Occlusive dressing applied.  Pt tolerated well transfer to SNF today   Dysphagia. Dysphagia 1 (puree);Nectar-thick liquid - d/c'd NG tube and tube feedings 4/04 No indication for PEG at this time Please consider changing trach tube to : Cuffless vs decannulation   S/P Septic/cardiogenic shock - resolved, in setting of streptococcal bacteremia and meningitis Acute systolic CHF CHF: EF 27-78% on 3/16 echo. Repeat ECHO with EF 50-55%.  Cardiac arrest 3/15 CXR with B pleural effusion -- worse (3/27) NSTEMI Cardiology following, last seen on 3/24 by the heart failure team Continue amiodarone, coreg, held bidil & spironolactone [worsening renal function]:/Hypotension Lasix on hold because of worsening renal function    AKI 2nd to ATN. Creatinine on the rise, over diuresis? 1.5>1.81>1.88>1.37 Hypokalemia-repleted Hypernatremia-continue half normal saline given worsening renal function, changed  half normal saline to D51/4 ns given rising sodium Closely follow renal function, sodium at SNF   Lasix on hold     Shock liver - AST/ALT slightly higher, resolved Liver function normalizing   Anemia ,Leukocytosis - resolved  F/u CBC    Streptococcal bacteremia and meningitis RPR positive Day 14 ceftriaxone, decadron per ID >> dcd on 3/27 Pen G 2.4 million units IM weekly until 4/10 No acitive infxn for now   Hypoglycemia-resolved Relative adrenal insufficiency >> cortisol 15.2 from 3/15 SSI with CBG Q4, continue D5 half normal saline to prevent hypoglycemia Cont insulin coverage.  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 Status post neurology consultation 3/14 Initial neurology consultation on 3/14 recommended MRI whenever able to do so EEG showed burst suppression pattern likely from a combination of fentanyl, in addition to severe encephalopathy. No seizures noted on the EEG.  Overall, poor prognosis for neurological recovery. Therefore palliative care consultation requested- continue current care    Hx of ETOH, THC Streptococcal meningitis  Thiamine, folic acid Off sedation Anca neg, ana pos, ds-dna neg results, speckled pattern noticed.      Discharge Exam:  Blood pressure 118/89,  pulse 67, temperature 97.5 F (36.4 C), temperature source Oral, resp. rate 20, height 5' 8.9" (1.75 m), weight 55.4 kg (122 lb 2.2 oz), SpO2 100 %.   General: chronically ill appearing male, unable to follow commands Neuro: Awake, .  HEENT: [place]/AT, PERRL, #6 trach midline, thick Purulent secretions per trache Cardiac: RRR, Nl S1/S2, -M/R/G. Chest: resps even non-labored, rhonchi BLF.  Abd: Soft, NT, ND and +BS. Ext: Intact, warm/dry      SignedReyne Dumas 03/10/2016, 9:48 AM        Time spent >45 mins

## 2016-03-14 ENCOUNTER — Non-Acute Institutional Stay (SKILLED_NURSING_FACILITY): Payer: Medicaid Other | Admitting: Adult Health

## 2016-03-14 ENCOUNTER — Encounter: Payer: Self-pay | Admitting: Adult Health

## 2016-03-14 DIAGNOSIS — I5021 Acute systolic (congestive) heart failure: Secondary | ICD-10-CM | POA: Diagnosis not present

## 2016-03-14 DIAGNOSIS — I469 Cardiac arrest, cause unspecified: Secondary | ICD-10-CM | POA: Diagnosis not present

## 2016-03-14 DIAGNOSIS — G9340 Encephalopathy, unspecified: Secondary | ICD-10-CM | POA: Diagnosis not present

## 2016-03-14 DIAGNOSIS — I214 Non-ST elevation (NSTEMI) myocardial infarction: Secondary | ICD-10-CM

## 2016-03-14 DIAGNOSIS — G002 Streptococcal meningitis: Secondary | ICD-10-CM | POA: Diagnosis not present

## 2016-03-14 DIAGNOSIS — J9611 Chronic respiratory failure with hypoxia: Secondary | ICD-10-CM | POA: Diagnosis not present

## 2016-03-14 DIAGNOSIS — R5381 Other malaise: Secondary | ICD-10-CM

## 2016-03-14 DIAGNOSIS — A5903 Trichomonal cystitis and urethritis: Secondary | ICD-10-CM

## 2016-03-14 HISTORY — DX: Non-ST elevation (NSTEMI) myocardial infarction: I21.4

## 2016-03-14 NOTE — Progress Notes (Signed)
Patient ID: Dale [name], male   DOB: [birth year], 50 y.o.   MRN: [id number]   Facility: [facility]       No Known Allergies  Chief Complaint  Patient presents with  . Hospitalization Follow-up    Hospital Follow up    HPI:  He has had a prolonged hospitalization for  cardiogenic shock meningitis; he did require a trach which has been removed. He had an NSTEMI with cardiac arrest on 02-17-16.  He is also being treated for acute encephalopathy; he cannot participate in the hpi or ros; he is restless. He is here at this time for short term rehab. I am not certain if he will be able to be discharged independently. More than likely e will require some type of long term care.    Past Medical History  Diagnosis Date  . Depression   . Cardiac arrest (HCC)   . Cardiogenic shock (HCC)   . Acute respiratory failure with hypoxia (HCC)   . Meningitis, streptococcal 02/16/2016  . 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 Tobacco abuse 02/16/2016  . Substance abuse   . ETOH abuse   . Septic shock [facility])      Past Surgical History  Procedure Laterality Date  . Ankle surgery    . Cardiac catheterization N/A 02/17/2016    Procedure: IABP Insertion;  Surgeon: Iran Ouch, MD;  Location: [facility] INVASIVE CV [facility];  Service: Cardiovascular;  Laterality: N/A;    History reviewed. No pertinent family history.  Social History   Social History  . Marital Status: Legally Separated    Spouse Name: N/A  . Number of Children: N/A  . Years of Education: N/A   Occupational History  . Not on file.   Social History Main Topics  . Smoking status: Current Every Day Smoker -- 0.10 packs/day    Types: Cigarettes  . Smokeless tobacco: Not on file  . Alcohol Use: Yes  . Drug Use: Yes    Special: Marijuana  . Sexual Activity: Not on file   Other Topics Concern  . Not on file   Social History Narrative      VITAL SIGNS BP 107/66 mmHg  Pulse 78  Temp(Src) 98.7 F (37.1 C) (Oral)  Resp  18  Ht  (1.676 m)  Wt 123 lb 4 oz (55.906 kg)  BMI 19.90 kg/m2  SpO2 98%  Patient's Medications  New Prescriptions   No medications on file  Previous Medications   ACETAMINOPHEN (TYLENOL) 160 MG/5ML SOLUTION    Take 20.3 mLs (650 mg total) by mouth every 6 (six) hours as needed for mild pain, headache or fever.   ALBUTEROL (PROVENTIL) (2.5 MG/3ML) 0.083% NEBULIZER SOLUTION    Take 3 mLs (2.5 mg total) by nebulization every 3 (three) hours as needed for wheezing.   AMIODARONE (PACERONE) 200 MG TABLET    Take 1 tablet (200 mg total) by mouth daily.   ASPIRIN 81 MG CHEWABLE TABLET    Chew 1 tablet (81 mg total) by mouth daily.   CARVEDILOL (COREG) 6.25 MG TABLET    Take 1 tablet (6.25 mg total) by mouth 2 (two) times daily with a meal.   FOLIC ACID (FOLVITE) 1 MG TABLET    Take 1 tablet (1 mg total) by mouth daily.   NUTRITIONAL SUPPLEMENTS (NUTRITIONAL SUPPLEMENT PO)    Take by mouth. HSG Puree texture, Nectar consistency.   PANTOPRAZOLE SODIUM (PROTONIX) 40 MG/20 ML PACK    Take 20 mLs (40  mg total) by mouth daily.   PENICILLIN G BENZATHINE 2400000 UNIT/4ML SUSP    Inject 2.4 Million Units into the muscle once.   THIAMINE 100 MG TABLET    Take 1 tablet (100 mg total) by mouth daily.   TRAMADOL (ULTRAM) 50 MG TABLET    Take 1 tablet (50 mg total) by mouth every 6 (six) hours as needed.  Modified Medications   No medications on file  Discontinued Medications     SIGNIFICANT DIAGNOSTIC EXAMS  02-16-16: ct of head: Ventricles mildly enlarged with sulci appearing normal. Thisappearance potentially could represent a degree of normal pressure hydrocephalus. This finding also could be secondary to prior or recent meningitis meningitis. No intracranial mass, hemorrhage, or extra-axial fluid collection. No acute infarct evident. Multifocal sinusitis. Given these current findings, brain MRI pre and post-contrast may be helpful to further assess, in particular to further evaluate the  meningeal regions.   02-18-16: TEE: LVEF 20-25%, normal wall thickness, severe global hypokinesis, diastolic dysfunction with normal LV filling pressure, normal LA size, mild TR, RVSP 32 mmHg, dilated IVC, pacer wire noted in the RA/RV.   02-24-16: TEE:   - Left ventricle: The cavity size was normal. Wall thickness was normal. Systolic function was at the lower limits of normal. The estimated ejection fraction was in the range of 50% to 55%. Mild diffuse hypokinesis with no identifiable regional variations. Left ventricular diastolic function parameters were normal. - Pericardium, extracardiac: A small pericardial effusion was identified circumferential to the heart. The fluid had no   internal echoes.There was no evidence of hemodynamic compromise.  03-07-16: swallow study: Dysphagia 1 (Puree) solids;Nectar thick liquid    LABS REVIEWED:   02-16-16: wbc 18.4; hgb 13.9; hct 37.9; mcv 87.7; plt 155; glucose 132; bun 17; creat 1.66; k+ 4.1; na++131; ast 117; albumin 2.9 urine culture: no growth; RPR + ; VDRL: neg; HIV: nr; blood culture: streptococcus pneumoniae 02-19-16: wbc 21.5; hgb 10.2; hct 29.2; mcv 85.4 plt 161; glucose 116; bun 30; creat 1.86; k+ 3.4; na++ 142; ast 142 alt 83; albumin 1.5 02-25-16: wbc 14.0; gb 8.2; hct 25.6; mcv 93.;8 plt 281; glucose 96; bun 35; creat 1.67; k+ 4.3; na++ 150 02-29-16: wbc 8.9; hgb 8.3; hct 25.5; mcv 91.1; plt 294; glucose 202; bun 25; creat 1.46; k+ 3.8; na++136; liver normal albumin 1.7 mag 1.5; phos 3.3 03-09-16: wbc 5.2; hgb 9.6; hct 30.3; mcv 91.3p lt 432; glucose 96; bun 31; creat 1.37; k+ 4.2; na++147; liver normal albumin 2.4    Review of Systems  Unable to perform ROS: other    Physical Exam  Constitutional: No distress.  Frail   Eyes: Conjunctivae are normal.  Neck: Neck supple. No JVD present. No thyromegaly present.  Cardiovascular: Normal rate, regular rhythm and intact distal pulses.   Respiratory: Effort normal and breath sounds normal. No  respiratory distress. He has no wheezes.  GI: Soft. Bowel sounds are normal. He exhibits no distension. There is no tenderness.  Musculoskeletal: He exhibits no edema.  Able to move all extremities   Lymphadenopathy:    He has no cervical adenopathy.  Neurological: He is alert.  Skin: Skin is warm and dry. He is not diaphoretic.  Trach dressing intact no signs of infection present   Psychiatric:  Restless         ASSESSMENT/ PLAN:  1.  Status post cardiac arrest and NSTEMI: he is presently stable will continue amiodarone 200 mg daily will continue coreg 6.125 mg twice daily will  continue asa 81 mg daily  Will monitor  2. GERD: will continue protonix 40 mg daily   3. Alcohol abuse: will continue thiamine and folic acid  4. Acute/chronic respiratory failure: will continue albuterol neb every 3 hours as needed; his trach has been removed.   5. Trichomonal urethritis in male: will complete penicillin G 2.4 million units and will monitor  6. Meningitis/Acute encephalopathy: will continue therapy as directed; will continue to monitor he remains restless at this time  7. Physical deconditioning: will continue as directed; will continue supplements as per facility protocol. Will monitor his status.   8. Systolic heart failure: is presently not on diuretic; will continue coreg 6.125 mg twice daily will monitor   His repeated EF is 50-55%    Time spent with patient  50   minutes >50% time spent counseling; reviewing medical record; tests; labs; and developing future plan of care     Synthia Innocent NP [facility]  Contact [phone] Monday through Friday 8am- 5pm  After hours call [phone]

## 2016-03-15 ENCOUNTER — Non-Acute Institutional Stay (SKILLED_NURSING_FACILITY): Payer: Medicaid Other | Admitting: Internal Medicine

## 2016-03-15 DIAGNOSIS — I5020 Unspecified systolic (congestive) heart failure: Secondary | ICD-10-CM | POA: Diagnosis not present

## 2016-03-15 DIAGNOSIS — G002 Streptococcal meningitis: Secondary | ICD-10-CM

## 2016-03-15 DIAGNOSIS — F1910 Other psychoactive substance abuse, uncomplicated: Secondary | ICD-10-CM

## 2016-03-15 DIAGNOSIS — Z5900 Homelessness unspecified: Secondary | ICD-10-CM

## 2016-03-15 DIAGNOSIS — G919 Hydrocephalus, unspecified: Secondary | ICD-10-CM | POA: Diagnosis not present

## 2016-03-15 DIAGNOSIS — J9611 Chronic respiratory failure with hypoxia: Secondary | ICD-10-CM | POA: Diagnosis not present

## 2016-03-15 DIAGNOSIS — Z930 Tracheostomy status: Secondary | ICD-10-CM | POA: Diagnosis not present

## 2016-03-15 DIAGNOSIS — F1010 Alcohol abuse, uncomplicated: Secondary | ICD-10-CM | POA: Diagnosis not present

## 2016-03-15 DIAGNOSIS — A530 Latent syphilis, unspecified as early or late: Secondary | ICD-10-CM

## 2016-03-15 DIAGNOSIS — Z8674 Personal history of sudden cardiac arrest: Secondary | ICD-10-CM | POA: Diagnosis not present

## 2016-03-15 DIAGNOSIS — R5381 Other malaise: Secondary | ICD-10-CM

## 2016-03-15 DIAGNOSIS — I252 Old myocardial infarction: Secondary | ICD-10-CM

## 2016-03-15 NOTE — Progress Notes (Signed)
Patient ID: Dale [name], male   DOB: 1966-08-19, 50 y.o.   MRN: [id number]    HISTORY AND PHYSICAL   DATE: 03/15/16  Location:  [facility] Room Number: 158 A Place of Service: SNF (31)   Extended Emergency Contact Information Primary Emergency Contact: [place] Phone: [phone] Relation: Daughter Secondary Emergency Contact: [place] Dom, [place] [address] [place] Phone: [phone] Relation: Other  Advanced Directive information Does patient have an advance directive?: Yes, Type of Advance Directive: Out of facility DNR (pink MOST or yellow form), Does patient want to make changes to advanced directive?: No - Patient declined  Chief Complaint  Patient presents with  . New Admit To SNF    HPI:  50 yo male seen today as a new admission into SNF following hospital stay for strep meningitis, trichomonas urethritis, AKI, GP sepsis, metabolic encephalopathy, cardiogenic shock, acute systolic HF, cardiac arrest, hydrocephalus, respiratory failure, trach care, underweight and RPR (+). He presented to the ED 3/14th with increased confusion and Tmx 104.0. Exam revealed nuchal rigidity. WBC 18.4K, Cr 1.66, lactic acid 3.45, anion gap 19. Initial meningitis protocol with steroids, IV vanco/rocephin + ampicillin. CSF from LP grew  GPC in pairs --> strep pneumonia. He had a complicated and extended stay to include cardiac arrest and cardiogenic shock.  2D echo 20-25% EF improved to 50-55% prior to d/c. RPR (+) and started on Pen G 2.4 mill units IM weekly through 4/10th.  He presents to SNF for short term rehab with possible long term care.  He is a poor historian due to nonverbal. Hx obtained from chart. No nursing issues. No falls.  Status post cardiac arrest and NSTEMI - stable on amiodarone 200 mg daily, coreg 6.125 mg twice daily, asa 81 mg daily   GERD - stable on protonix 40 mg  daily   Alcohol abuse - takes thiamine and folic acid  chronic respiratory failure - stable on albuterol neb every 3 hours as needed; his trach has been removed.   Trichomonal urethritis in male/(+) RPR - taking penicillin G 2.4 million units  Recent Meningitis/Acute encephalopathy - completed abx in hospital  systolic heart failure - stable; not on diuretic; takes coreg 6.125 mg twice daily.  EF is 50-55% prior to d/c   Past Medical History  Diagnosis Date  . Depression   . Cardiac arrest ([place])   . Cardiogenic shock ([place])   . Acute respiratory failure with hypoxia ([place])   . Meningitis, streptococcal 02/16/2016  . 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 Tobacco abuse 02/16/2016  . Substance abuse   . ETOH abuse   . Septic shock [facility])     Past Surgical History  Procedure Laterality Date  . Ankle surgery    . Cardiac catheterization N/A 02/17/2016    Procedure: IABP Insertion;  Surgeon: Wellington Hampshire, MD;  Location: [facility];  Service: Cardiovascular;  Laterality: N/A;    No care team member to display  Social History   Social History  . Marital Status: Legally Separated    Spouse Name: N/A  . Number of Children: N/A  . Years of Education: N/A   Occupational History  . Not on file.   Social History Main Topics  . Smoking status: Current Every Day Smoker -- 0.10 packs/day    Types: Cigarettes  . Smokeless tobacco: Not on file  .  Alcohol Use: Yes  . Drug Use: Yes    Special: Marijuana  . Sexual Activity: Not on file   Other Topics Concern  . Not on file   Social History Narrative     reports that he has been smoking Cigarettes.  He has been smoking about 0.10 packs per day. He does not have any smokeless tobacco history on file. He reports that he drinks alcohol. He reports that he uses illicit drugs (Marijuana).  No family history on file. No family status information on file.    Immunization History  Administered Date(s) Administered    . Influenza Split 09/09/2015  . PPD Test 03/11/2016  . Pneumococcal-Unspecified 03/11/2016    No Known Allergies  Medications: Patient's Medications  New Prescriptions   No medications on file  Previous Medications   ACETAMINOPHEN (TYLENOL) 325 MG TABLET    Take 650 mg by mouth every 6 (six) hours as needed.   ALBUTEROL (PROVENTIL) (2.5 MG/3ML) 0.083% NEBULIZER SOLUTION    Take 3 mLs (2.5 mg total) by nebulization every 3 (three) hours as needed for wheezing.   AMIODARONE (PACERONE) 200 MG TABLET    Take 1 tablet (200 mg total) by mouth daily.   ASPIRIN 81 MG CHEWABLE TABLET    Chew 1 tablet (81 mg total) by mouth daily.   CARVEDILOL (COREG) 6.25 MG TABLET    Take 1 tablet (6.25 mg total) by mouth 2 (two) times daily with a meal.   FOLIC ACID (FOLVITE) 1 MG TABLET    Take 1 tablet (1 mg total) by mouth daily.   PANTOPRAZOLE (PROTONIX) 40 MG TABLET    Take 40 mg by mouth daily.   PENICILLIN G BENZATHINE 2400000 UNIT/4ML SUSP    Inject 2.4 Million Units into the muscle once.   THIAMINE 100 MG TABLET    Take 1 tablet (100 mg total) by mouth daily.   TRAMADOL (ULTRAM) 50 MG TABLET    Take 1 tablet (50 mg total) by mouth every 6 (six) hours as needed.  Modified Medications   No medications on file  Discontinued Medications   ACETAMINOPHEN (TYLENOL) 160 MG/5ML SOLUTION    Take 20.3 mLs (650 mg total) by mouth every 6 (six) hours as needed for mild pain, headache or fever.   NUTRITIONAL SUPPLEMENTS (NUTRITIONAL SUPPLEMENT PO)    Take by mouth. HSG Puree texture, Nectar consistency.   PANTOPRAZOLE SODIUM (PROTONIX) 40 MG/20 ML PACK    Take 20 mLs (40 mg total) by mouth daily.    Review of Systems  Unable to perform ROS: Patient nonverbal    Filed Vitals:   03/15/16 1423  BP: 107/66  Pulse: 78  Temp: 98.7 F (37.1 C)  TempSrc: Oral  Resp: 18  Height: _0  (1.676 m)  Weight: 123 lb 6.4 oz (55.974 kg)  SpO2: 98%   Body mass index is 19.93 kg/(m^2).  Physical Exam   Constitutional:  Lying in bed, frail appearing in NAD; temporal wasting  HENT:  Mouth/Throat: Oropharynx is clear and moist.  Eyes: Pupils are equal, round, and reactive to light. No scleral icterus.  Neck: Neck supple. Carotid bruit is not present. No tracheal deviation present. No thyromegaly present.  Lurline Idol present with no purulent d/c. no nuchal rigidity  Cardiovascular: Normal rate, regular rhythm and intact distal pulses.  Exam reveals no gallop and no friction rub.   Murmur (1/6 SEM) heard. no distal LE swelling. No calf TTP  Pulmonary/Chest: Effort normal and breath sounds normal. He has  no wheezes. He has no rales. He exhibits no tenderness.  Abdominal: Soft. Bowel sounds are normal. He exhibits no distension, no abdominal bruit, no pulsatile midline mass and no mass. There is no tenderness. There is no rebound and no guarding.  Lymphadenopathy:    He has no cervical adenopathy.  Neurological: He is alert.  Skin: Skin is warm and dry. No rash noted.  Psychiatric: He has a normal mood and affect. His behavior is normal.     Labs reviewed: Admission on 02/16/2016, Discharged on 03/10/2016  No results displayed because visit has over 200 results.    Admission on 02/13/2016, Discharged on 02/13/2016  Component Date Value Ref Range Status  . Sodium 02/13/2016 127* 135 - 145 mmol/L Final  . Potassium 02/13/2016 3.7  3.5 - 5.1 mmol/L Final  . Chloride 02/13/2016 96* 101 - 111 mmol/L Final  . CO2 02/13/2016 18* 22 - 32 mmol/L Final  . Glucose, Bld 02/13/2016 100* 65 - 99 mg/dL Final  . BUN 02/13/2016 27* 6 - 20 mg/dL Final  . Creatinine, Ser 02/13/2016 1.84* 0.61 - 1.24 mg/dL Final  . Calcium 02/13/2016 7.7* 8.9 - 10.3 mg/dL Final  . GFR calc non Af Amer 02/13/2016 41* >60 mL/min Final  . GFR calc Af Amer 02/13/2016 47* >60 mL/min Final   Comment: (NOTE) The eGFR has been calculated using the CKD EPI equation. This calculation has not been validated in all clinical  situations. eGFR's persistently <60 mL/min signify possible Chronic Kidney Disease.   . Anion gap 02/13/2016 13  5 - 15 Final  . WBC 02/13/2016 13.3* 4.0 - 10.5 K/uL Final  . RBC 02/13/2016 4.01* 4.22 - 5.81 MIL/uL Final  . Hemoglobin 02/13/2016 12.6* 13.0 - 17.0 g/dL Final  . HCT 02/13/2016 34.9* 39.0 - 52.0 % Final  . MCV 02/13/2016 87.0  78.0 - 100.0 fL Final  . MCH 02/13/2016 31.4  26.0 - 34.0 pg Final  . MCHC 02/13/2016 36.1* 30.0 - 36.0 g/dL Final  . RDW 02/13/2016 12.8  11.5 - 15.5 % Final  . Platelets 02/13/2016 147* 150 - 400 K/uL Final  . Troponin I 02/13/2016 <0.03  <0.031 ng/mL Final   Comment:        NO INDICATION OF MYOCARDIAL INJURY.   . Color, Urine 02/13/2016 YELLOW  YELLOW Final  . APPearance 02/13/2016 CLOUDY* CLEAR Final  . Specific Gravity, Urine 02/13/2016 1.014  1.005 - 1.030 Final  . pH 02/13/2016 5.5  5.0 - 8.0 Final  . Glucose, UA 02/13/2016 NEGATIVE  NEGATIVE mg/dL Final  . Hgb urine dipstick 02/13/2016 NEGATIVE  NEGATIVE Final  . Bilirubin Urine 02/13/2016 NEGATIVE  NEGATIVE Final  . Ketones, ur 02/13/2016 NEGATIVE  NEGATIVE mg/dL Final  . Protein, ur 02/13/2016 NEGATIVE  NEGATIVE mg/dL Final  . Nitrite 02/13/2016 NEGATIVE  NEGATIVE Final  . Leukocytes, UA 02/13/2016 LARGE* NEGATIVE Final  . Lactic Acid, Venous 02/13/2016 1.1  0.5 - 2.0 mmol/L Final  . Squamous Epithelial / LPF 02/13/2016 0-5* NONE SEEN Final  . WBC, UA 02/13/2016 6-30  0 - 5 WBC/hpf Final  . RBC / HPF 02/13/2016 0-5  0 - 5 RBC/hpf Final  . Bacteria, UA 02/13/2016 FEW* NONE SEEN Final  . Trichomonas, UA 02/13/2016 PRESENT   Final    Dg Chest 1 View  03/05/2016  CLINICAL DATA:  Hypoxia. EXAM: CHEST 1 VIEW COMPARISON:  March 03, 2016. FINDINGS: Stable cardiomediastinal silhouette. Tracheostomy and nasogastric tubes are unchanged in position. Right-sided PICC line is stable. No  pneumothorax is noted. Improved bibasilar opacities are noted consistent with improving pulmonary edema. Bony  thorax is unremarkable. IMPRESSION: Stable support apparatus. Improved bibasilar opacities consistent with improving edema. Electronically Signed   By: Marijo Conception, M.D.   On: 03/05/2016 11:58   Ct Head Wo Contrast  02/16/2016  CLINICAL DATA:  Fever and altered mental status; tremors EXAM: CT HEAD WITHOUT CONTRAST TECHNIQUE: Contiguous axial images were obtained from the base of the skull through the vertex without intravenous contrast. COMPARISON:  June 01, 2009 FINDINGS: The ventricles are mildly enlarged in a generalized manner and appear larger compared to the prior study. The sulci appear unremarkable. There is no intracranial mass, hemorrhage, extra-axial fluid collection, or midline shift. No focal gray-white compartment lesions are identified. No evidence of acute infarct. Bony calvarium appears intact. The mastoid air cells are clear. There is opacification in the left maxillary antrum as well as in multiple ethmoid air cells bilaterally. There is mild mucosal thickening in the left sphenoid sinus region. No intraorbital lesions are apparent. IMPRESSION: Ventricles mildly enlarged with sulci appearing normal. This appearance potentially could represent a degree of normal pressure hydrocephalus. This finding also could be secondary to prior or recent meningitis meningitis. No intracranial mass, hemorrhage, or extra-axial fluid collection. No acute infarct evident. Multifocal sinusitis. Given these current findings, brain MRI pre and post-contrast may be helpful to further assess, in particular to further evaluate the meningeal regions. Electronically Signed   By: Lowella Grip III M.D.   On: 02/16/2016 14:04   Dg Chest Port 1 View  03/03/2016  CLINICAL DATA:  Shortness of breath. EXAM: PORTABLE CHEST 1 VIEW COMPARISON:  02/29/2016. FINDINGS: Tracheostomy tube, NG tube, right PICC line in stable position. Mediastinum hilar structures normal. Heart size stable. Diffuse bilateral airspace disease.  Basilar atelectasis. Small bilateral pleural effusions. IMPRESSION: 1. Lines and tubes in stable position. 2. Bilateral airspace disease consistent with bilateral pulmonary infiltrates/edema. Low lung volumes with basilar atelectasis. Small bilateral pleural effusions. Similar findings noted on prior exam. Electronically Signed   By: Marcello Moores  Register   On: 03/03/2016 07:14   Dg Chest Port 1 View  02/29/2016  CLINICAL DATA:  50 year old male with acute respiratory failure EXAM: PORTABLE CHEST 1 VIEW COMPARISON:  Prior chest x-ray 02/28/2016 FINDINGS: The tracheostomy tube remains midline and at the level of the clavicles. A right upper extremity approach PICC terminates in good position overlying the superior cavoatrial junction. A nasogastric tube is present. The tip lies off the field of view but below the diaphragm and presumably within the stomach. Stable cardiac and mediastinal contours. Bilateral right larger than left layering pleural effusions have slightly enlarged. There is associated bibasilar atelectasis. Background of mild pulmonary vascular congestion. No pneumothorax. No acute osseous abnormality. IMPRESSION: 1. Slight interval progression of right greater than left layering pleural effusions and associated bibasilar atelectasis. 2. Stable and satisfactory support apparatus. Electronically Signed   By: Jacqulynn Cadet M.D.   On: 02/29/2016 07:11   Dg Chest Port 1 View  02/28/2016  CLINICAL DATA:  Acute respiratory failure EXAM: PORTABLE CHEST 1 VIEW COMPARISON:  02/26/2016 FINDINGS: Cardiomediastinal silhouette is stable. There is tracheostomy tube in place. Endotracheal tube has been removed. NG tube is unchanged in position. No pulmonary edema. Right arm PICC line with tip in SVC right atrium junction. Mild bilateral basilar hazy atelectasis or early infiltrate. IMPRESSION: Tracheostomy tube in place. Stable NG tube position. Right arm PICC line with tip in SVC right atrium junction. Mild  basilar atelectasis or infiltrate. No pulmonary edema. Electronically Signed   By: Lahoma Crocker M.D.   On: 02/28/2016 09:52   Dg Chest Portable 1 View  02/26/2016  CLINICAL DATA:  ARDS EXAM: PORTABLE CHEST 1 VIEW COMPARISON:  02/25/2016 FINDINGS: Cardiac shadow is stable. A nasogastric catheter and endotracheal tube are again seen in stable position. The lungs are well aerated bilaterally. Persistent left retrocardiac atelectasis and small effusion are noted. No new focal abnormality is seen. IMPRESSION: Stable changes in the left base. Electronically Signed   By: Inez Catalina M.D.   On: 02/26/2016 07:24   Dg Chest Port 1 View  02/25/2016  CLINICAL DATA:  Intubated patient, encephalopathy and meningitis, septic shock, CHF. EXAM: PORTABLE CHEST 1 VIEW COMPARISON:  Portable chest x-ray of February 24, 2016 FINDINGS: The lungs are well-expanded. There is persistent left lower lobe atelectasis or pneumonia and trace left pleural effusion. The right lung is clear. The heart and pulmonary vascularity are normal. The endotracheal tube tip lies approximately 2 cm above the carina. The esophagogastric tube tip projects below the inferior margin of the image. IMPRESSION: Somewhat low positioning of the endotracheal tube with. Withdrawal by 2 cm is recommended to avoid accidental mainstem bronchus intubation. Stable left lower lobe atelectasis and small left pleural effusion. Electronically Signed   By: David  Martinique M.D.   On: 02/25/2016 07:24   Dg Chest Port 1 View  02/24/2016  CLINICAL DATA:  Endotracheal tube assessment. EXAM: PORTABLE CHEST 1 VIEW COMPARISON:  02/23/2016 FINDINGS: Endotracheal tube is present with tip measuring 2.5 cm above the carinal. Enteric tube tip is off the field of view but below the left hemidiaphragm. Shallow inspiration. Heart size and pulmonary vascularity are normal. Mild serration of left hemidiaphragm suggesting infiltration and/ or effusion. No pneumothorax. IMPRESSION: Endotracheal  tube tip measures 2.5 cm above the carina. Infiltration and/ or effusion in the left lung base. Electronically Signed   By: Lucienne Capers M.D.   On: 02/24/2016 01:34   Dg Chest Port 1 View  02/23/2016  CLINICAL DATA:  Shortness of breath. EXAM: PORTABLE CHEST 1 VIEW COMPARISON:  02/22/2016. FINDINGS: Endotracheal tube tip 1.8 cm above the carina, slight proximal repositioning of 1 a 2 cm should be considered. NG tube noted with tip below left hemidiaphragm. Heart size stable. Bilateral pulmonary infiltrates, left side greater right noted. Small left pleural effusion cannot be excluded. No pneumothorax . IMPRESSION: 1. Endotracheal tube tip 1.8 cm above the carina, slight proximal repositioning of 1 a 2 cm should be considered. NG tube in stable position. 2. Diffuse left lung and right lower lobe pulmonary infiltrates noted. These are new from prior exam. Small left pleural effusion cannot be excluded . Electronically Signed   By: Marcello Moores  Register   On: 02/23/2016 07:31   Dg Chest Port 1 View  02/22/2016  CLINICAL DATA:  Respiratory failure. EXAM: PORTABLE CHEST 1 VIEW COMPARISON:  02/21/2016. FINDINGS: Endotracheal tube, NG tube, right IJ sheath in stable position. Interim removal Swan-Ganz catheter. Heart size stable. Low lung volumes with mild bibasilar atelectasis and or infiltrates. Interim improvement from prior exam. Small left pleural effusion cannot be excluded. No pneumothorax. IMPRESSION: 1. Level of Swan-Ganz catheter. Endotracheal tube, NG tube, right IJ sheath in stable position. 2. Low lung volumes with mild bibasilar atelectasis and/or infiltrates. Interim improvement from prior exam. Small left pleural effusion cannot be excluded. Electronically Signed   By: Marcello Moores  Register   On: 02/22/2016 07:13   Dg Chest [facility]  1 View  02/21/2016  CLINICAL DATA:  Respiratory failure EXAM: PORTABLE CHEST 1 VIEW COMPARISON:  Chest radiograph from one day prior. FINDINGS: Right internal jugular Swan-Ganz  catheter terminates in the right infrahilar region. Enteric tube enters the stomach with the tip not seen on this image. Endotracheal tube tip is 2.5 cm above the carina. Stable cardiomediastinal silhouette with normal heart size. No pneumothorax. Stable small left pleural effusion. No pulmonary edema. Stable left lower lobe consolidation. IMPRESSION: 1. Well-positioned endotracheal tube. Right internal jugular Swan-Ganz catheter terminates over the right infrahilar region, consider retracting 2-3 cm. 2. Stable small left pleural effusion. 3. Stable left lower lobe consolidation, favor atelectasis. Electronically Signed   By: Ilona Sorrel M.D.   On: 02/21/2016 08:00   Dg Chest Port 1 View  02/20/2016  CLINICAL DATA:  CHF EXAM: PORTABLE CHEST 1 VIEW COMPARISON:  Chest radiograph from one day prior. FINDINGS: Right internal jugular Swan-Ganz catheter terminates over the central right lower lung, unchanged. Endotracheal tube tip is 2.4 cm above the carina. Enteric tube enters the stomach with the tip not seen on this image. Intra-aortic balloon pump marker terminates over the proximal descending thoracic aorta at the T7-8 level. Stable cardiomediastinal silhouette with normal heart size. No pneumothorax. No pleural effusion. Hazy opacities throughout the bilateral parahilar lungs are not appreciably changed. Probable complete left lower lobe atelectasis, unchanged. IMPRESSION: 1. Right internal jugular Swan-Ganz catheter terminates over the central lower right lung, consider retracting 3-4 cm . 2. Otherwise well-positioned support structures as described. 3. Stable hazy opacities throughout the parahilar lungs, favor pulmonary edema. Electronically Signed   By: Ilona Sorrel M.D.   On: 02/20/2016 09:34   Dg Chest Port 1 View  02/19/2016  CLINICAL DATA:  CHF EXAM: PORTABLE CHEST 1 VIEW COMPARISON:  02/18/2016 FINDINGS: Support devices remain in place, unchanged. Heart is normal size. Diffuse interstitial and  alveolar opacities are again noted, likely edema. This is somewhat more confluent in both lung bases, left greater than right. Cannot completely exclude infiltrate at the left base. No visible effusions. No acute bony abnormality. IMPRESSION: Continued diffuse interstitial and alveolar opacities, likely edema. Somewhat more focal opacities in the lung bases, left greater than right. Cannot exclude pneumonia. Electronically Signed   By: Rolm Baptise M.D.   On: 02/19/2016 08:29   Dg Chest Port 1 View  02/18/2016  CLINICAL DATA:  Entry aortic balloon pump asist. EXAM: PORTABLE CHEST 1 VIEW COMPARISON:  February 17, 2016. FINDINGS: The heart size and mediastinal contours are within normal limits. No pneumothorax or pleural effusion is noted. Endotracheal nasogastric tubes are unchanged in position. Right internal jugular Swan-Ganz catheter is unchanged with distal tip in expected position of right pulmonary artery. Distal tip of aortic balloon catheter is seen over expected position of superior portion of descending thoracic aorta just below aortic knob. Stable bilateral perihilar opacities are noted concerning for edema or inflammation. The visualized skeletal structures are unremarkable. IMPRESSION: Distal tip of aortic balloon catheter appears to be slightly more inferior in position compared to prior exam, just below aortic knob currently. This projects over the expected position of the superior aspect of descending thoracic aorta. Stable bilateral perihilar opacities are noted concerning for edema or inflammation. Otherwise stable support apparatus. Electronically Signed   By: Marijo Conception, M.D.   On: 02/18/2016 07:24   Dg Chest Port 1 View  02/17/2016  CLINICAL DATA:  Ventilator dependence. EXAM: PORTABLE CHEST 1 VIEW COMPARISON:  Earlier the same day.  FINDINGS: 1822 hours. Endotracheal tube tip is 7.4 cm above the base of the carina. Right IJ pulmonary artery catheter tip is in the interlobar pulmonary  artery. The tip of the intra-aortic balloon pump projects over the inferior aspect of the transverse aorta. The NG tube passes into the stomach although the distal tip position is not included on the film. Proximal port of the NG tube is at or just above the EG junction. Diffuse interstitial and central alveolar opacities suggest edema. Telemetry leads overlie the chest. IMPRESSION: The tip of the intra-aortic balloon pump projects over the inferior aspect of the transverse aorta. Electronically Signed   By: Misty Stanley M.D.   On: 02/17/2016 18:34   Dg Chest Port 1 View  02/17/2016  CLINICAL DATA:  50 year old male with a history of Swan-Ganz catheter placement. EXAM: PORTABLE CHEST 1 VIEW COMPARISON:  02/17/2016 FINDINGS: Cardiomediastinal silhouette unchanged in size and contour. Skull pads again project over the thorax. Unchanged endotracheal tube, gastric tube. Interval placement of right IJ sheath, through which a Swan-Ganz catheter has been placed terminating in the right pulmonary arteries. Persisting bilateral airspace opacity IMPRESSION: Interval placement of right IJ sheath, through which a Swan-Ganz catheter has been placed and terminates in the right pulmonary arteries at the hilum. Similar appearance of bilateral airspace opacities, potentially infection and/or edema. Unchanged support apparatus as above. Signed, Dulcy Fanny. Earleen Newport, DO [facility] Electronically Signed   By: Corrie Mckusick D.O.   On: 02/17/2016 13:53   Dg Chest Port 1 View  02/17/2016  CLINICAL DATA:  Hypoxia EXAM: PORTABLE CHEST 1 VIEW COMPARISON:  February 16, 2016 FINDINGS: Endotracheal tube tip is 3.5 cm above the carina. Nasogastric tube tip and side port are in the stomach. No pneumothorax evident. There is interstitial edema throughout the lungs bilaterally, perhaps slightly increased from 1 day prior. No airspace consolidation. Heart is upper normal in size with  pulmonary vascularity within normal limits. No adenopathy. IMPRESSION: Tube positions as described without pneumothorax. Slight increase in interstitial edema. No airspace consolidation. No change in cardiac silhouette. Electronically Signed   By: Lowella Grip III M.D.   On: 02/17/2016 07:31   Dg Chest Portable 1 View  02/16/2016  CLINICAL DATA:  Endotracheal tube placement EXAM: PORTABLE CHEST 1 VIEW COMPARISON:  Chest x-ray from earlier same day. FINDINGS: Endotracheal tube well positioned with tip approximately 2 cm above the carina. Enteric tube passes below the diaphragm. Heart size is normal. There is central pulmonary vascular congestion and mild interstitial edema. Suspect small left pleural effusion. No pneumothorax. Osseous structures about the chest are unremarkable. IMPRESSION: 1. Endotracheal tube well positioned with tip approximately 2 cm above the carina. 2. Central pulmonary vascular congestion and mild bilateral interstitial edema suggesting mild volume overload/resuscitation efforts. No frank alveolar pulmonary edema. Electronically Signed   By: Franki Cabot M.D.   On: 02/16/2016 19:38   Dg Chest Port 1 View  02/16/2016  CLINICAL DATA:  Altered mental status.  Depression. EXAM: PORTABLE CHEST 1 VIEW COMPARISON:  02/13/2016. FINDINGS: Midline trachea. Normal heart size and mediastinal contours. Left costophrenic angle exclusion. No pleural effusion or pneumothorax. Clear lungs. IMPRESSION: No active disease. Electronically Signed   By: Abigail Miyamoto M.D.   On: 02/16/2016 13:48   Dg Abd Portable 1v  02/26/2016  CLINICAL DATA:  Nasogastric tube placement EXAM: PORTABLE ABDOMEN - 1 VIEW COMPARISON:  None. FINDINGS: Nasogastric tube tip is near the pylorus with the side port in  the gastric antrum region. Bowel gas pattern is unremarkable. No free air evident. There is consolidation in the medial left lung base. IMPRESSION: Nasogastric tube tip and side port in distal stomach. Bowel gas  pattern unremarkable. Medial left lung base consolidation. Electronically Signed   By: Lowella Grip III M.D.   On: 02/26/2016 16:40   Dg Swallowing Func-speech Pathology  03/07/2016  Objective Swallowing Evaluation: Type of Study: MBS-Modified Barium Swallow Study Patient Details Name: Dale [name] MRN: [id number] Date of Birth: 1966/12/01 Today's Date: 03/07/2016 Time: SLP Start Time (ACUTE ONLY): 0928-SLP Stop Time (ACUTE ONLY): 4098 SLP Time Calculation (min) (ACUTE ONLY): 24 min Past Medical History: Past Medical History Diagnosis Date . Depression  Past Surgical History: Past Surgical History Procedure Laterality Date . Ankle surgery   . Cardiac catheterization N/A 02/17/2016   Procedure: IABP Insertion;  Surgeon: Wellington Hampshire, MD;  Location: [facility];  Service: Cardiovascular;  Laterality: N/A; HPI: 50 y.o. male with h/o depression who initially presented to [facility] ED 3/11 with URI symptoms and was d/c with Tamiflu. Family brought pt to ED 3/14 with leukocytosis, fever of 103, decreased consciousness and minimally responsive. 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 Pt intubated in ED, trach placed 3/24 due to inability to wean off vent. No Data Recorded Assessment / Plan / Recommendation CHL IP CLINICAL IMPRESSIONS 03/07/2016 Therapy Diagnosis Mild oral phase dysphagia;Mild pharyngeal phase dysphagia Clinical Impression MBS complete with PMSV donned throughout assessment. Pt exhibited mild oropharyngeal dysphagia during this study due to sensory deficits. Weak lingual manipulation resulted in decreased bolus cohesion. Delayed swallow initiation to the vallecula and pyriform sinuses observed, resulting in silent penetration above the vocal cords x1 with intake of thin liquids. No compensatory strategies attempted due to altered mental status and inability to comply. No airway compromise noted for all other consistencies and no pharyngeal  residue observed. Due to pt's decreased mental status, recommend Dysphagia 1 (puree) textures to prevent pocketing with nectar thick liquids (no straws) and meds crushed in puree, with PMSV donned. Pt and wife educated re: diet recommendation and PMSV use/precautions. SLP will f/u to determine diet tolerance and advise diet tolerance. Impact on safety and function (No Data)   CHL IP TREATMENT RECOMMENDATION 03/07/2016 Treatment Recommendations Therapy as outlined in treatment plan below   Prognosis 03/07/2016 Prognosis for Safe Diet Advancement Good Barriers to Reach Goals Severity of deficits;Time post onset Barriers/Prognosis Comment -- CHL IP DIET RECOMMENDATION 03/07/2016 SLP Diet Recommendations Dysphagia 1 (Puree) solids;Nectar thick liquid Liquid Administration via Cup;No straw Medication Administration Crushed with puree Compensations Minimize environmental distractions;Slow rate;Small sips/bites Postural Changes Seated upright at 90 degrees   CHL IP OTHER RECOMMENDATIONS 03/07/2016 Recommended Consults -- Oral Care Recommendations Oral care BID Other Recommendations Order thickener from pharmacy;Prohibited food (jello, ice cream, thin soups);Remove water pitcher;Have oral suction available;Place PMSV during PO intake   CHL IP FOLLOW UP RECOMMENDATIONS 03/07/2016 Follow up Recommendations (No Data)   CHL IP FREQUENCY AND DURATION 03/07/2016 Speech Therapy Frequency (ACUTE ONLY) min 2x/week Treatment Duration 2 weeks      CHL IP ORAL PHASE 03/07/2016 Oral Phase Impaired Oral - Pudding Teaspoon -- Oral - Pudding Cup -- Oral - Honey Teaspoon -- Oral - Honey Cup -- Oral - Nectar Teaspoon -- Oral - Nectar Cup Decreased bolus cohesion;Weak lingual manipulation Oral - Nectar Straw (No Data) Oral - Thin Teaspoon -- Oral - Thin Cup Decreased bolus cohesion;Weak lingual manipulation Oral -  Thin Straw -- Oral - Puree Decreased bolus cohesion;Weak lingual manipulation Oral - Mech Soft -- Oral - Regular -- Oral - Multi-Consistency --  Oral - Pill -- Oral Phase - Comment --  CHL IP PHARYNGEAL PHASE 03/07/2016 Pharyngeal Phase Impaired Pharyngeal- Pudding Teaspoon -- Pharyngeal -- Pharyngeal- Pudding Cup -- Pharyngeal -- Pharyngeal- Honey Teaspoon -- Pharyngeal -- Pharyngeal- Honey Cup -- Pharyngeal -- Pharyngeal- Nectar Teaspoon -- Pharyngeal -- Pharyngeal- Nectar Cup Delayed swallow initiation-pyriform sinuses;Penetration/Aspiration during swallow Pharyngeal Material enters airway, remains ABOVE vocal cords and not ejected out Pharyngeal- Nectar Straw (No Data) Pharyngeal -- Pharyngeal- Thin Teaspoon -- Pharyngeal -- Pharyngeal- Thin Cup Penetration/Aspiration during swallow;Delayed swallow initiation-pyriform sinuses Pharyngeal Material enters airway, remains ABOVE vocal cords and not ejected out Pharyngeal- Thin Straw -- Pharyngeal -- Pharyngeal- Puree Delayed swallow initiation-vallecula Pharyngeal -- Pharyngeal- Mechanical Soft -- Pharyngeal -- Pharyngeal- Regular -- Pharyngeal -- Pharyngeal- Multi-consistency -- Pharyngeal -- Pharyngeal- Pill -- Pharyngeal -- Pharyngeal Comment --  CHL IP CERVICAL ESOPHAGEAL PHASE 03/07/2016 Cervical Esophageal Phase WFL Pudding Teaspoon -- Pudding Cup -- Honey Teaspoon -- Honey Cup -- Nectar Teaspoon -- Nectar Cup -- Nectar Straw -- Thin Teaspoon -- Thin Cup -- Thin Straw -- Puree -- Mechanical Soft -- Regular -- Multi-consistency -- Pill -- Cervical Esophageal Comment -- No flowsheet data found. [place] Siren 03/07/2016, 11:17 AM Orbie Pyo Colvin Caroli.Ed CCC-SLP Pager [phone]              Ct Portable Head W/o Cm  02/17/2016  CLINICAL DATA:  50 year old male with a history of hydrocephalus. Acute meningitis. EXAM: CT HEAD WITHOUT CONTRAST TECHNIQUE: Contiguous axial images were obtained from the base of the skull through the vertex without intravenous contrast. COMPARISON:  02/16/2016 FINDINGS: Unremarkable appearance of the calvarium without acute fracture or aggressive lesion. Unremarkable appearance  of the scalp soft tissues. Unremarkable appearance of the bilateral orbits. Partially visualized endotracheal tube.  Oral enteric tube. Near complete opacification of the left maxillary sinus with frothy secretions. Trace secretions of the right maxillary sinus. Mucoperiosteal thickening of the ethmoid air cells and the sphenoid sinuses. Trace disease of the frontal sinuses. Mastoid air cells are clear. Layered fluid within the nasopharynx. Configuration the ventricles is similar to comparison with bifrontal diameter measuring 40 mm, unchanged from prior. Unchanged appearance of the hemispheric sulci. No acute intracranial hemorrhage.  No midline shift. Gray-white differentiation relatively maintained. IMPRESSION: Unchanged ventricular dilation and appearance of the cerebral sulci, with no evidence of acute hemorrhage or infarction. Signed, Dulcy Fanny. Earleen Newport, DO [facility]iology Electronically Signed   By: Corrie Mckusick D.O.   On: 02/17/2016 07:23     Assessment/Plan   ICD-9-CM ICD-10-CM   1. Physical deconditioning 799.3 R53.81   2. Chronic respiratory failure with hypoxia (HCC) 518.83 J96.11    799.02    3. Meningitis, streptococcal 320.2 G00.2   4. Systolic heart failure, unspecified heart failure chronicity (HCC) 428.20 I50.20   5. Positive serology for syphilis 097.1 A53.0   6. History of MI (myocardial infarction) 412 I25.2   7. Hydrocephalus 331.4 G91.9   8. 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 Homeless V60.0 Z59.0   10. ETOH abuse 305.00 F10.10   11. Substance abuse 305.90 F19.10   12. History of cardiac arrest V12.53 Z86.74     Complete Pen G week of 03/14/16  Strict aspiration precautions  check CBC and CMP  PT/OT/ST as ordered  Trach care as indicated  F/u with specialists  as ordered  Nutritional supplements as ordered  GOAL: short term rehab with possible long term care.  Communicated with pt and nursing.  Will  follow  Temitope Flammer S. Perlie Gold  [facility] [address] [place], [place] [address] [phone] Cell (Monday-Friday 8 AM - 5 PM) [phone] After 5 PM and follow prompts

## 2016-03-20 ENCOUNTER — Emergency Department (HOSPITAL_COMMUNITY): Payer: Medicaid Other

## 2016-03-20 ENCOUNTER — Inpatient Hospital Stay (HOSPITAL_COMMUNITY)
Admission: EM | Admit: 2016-03-20 | Discharge: 2016-04-05 | DRG: 871 | Disposition: A | Discharge status: Skilled Nursing Facility | Payer: Medicaid Other | Attending: Internal Medicine | Admitting: Internal Medicine

## 2016-03-20 ENCOUNTER — Encounter (HOSPITAL_COMMUNITY): Payer: Self-pay | Admitting: *Deleted

## 2016-03-20 DIAGNOSIS — G0490 Encephalitis and encephalomyelitis, unspecified: Secondary | ICD-10-CM | POA: Diagnosis present

## 2016-03-20 DIAGNOSIS — I252 Old myocardial infarction: Secondary | ICD-10-CM

## 2016-03-20 DIAGNOSIS — Y92129 Unspecified place in nursing home as the place of occurrence of the external cause: Secondary | ICD-10-CM | POA: Diagnosis not present

## 2016-03-20 DIAGNOSIS — T68XXXA Hypothermia, initial encounter: Secondary | ICD-10-CM

## 2016-03-20 DIAGNOSIS — F17210 Nicotine dependence, cigarettes, uncomplicated: Secondary | ICD-10-CM | POA: Diagnosis present

## 2016-03-20 DIAGNOSIS — I959 Hypotension, unspecified: Secondary | ICD-10-CM

## 2016-03-20 DIAGNOSIS — R6521 Severe sepsis with septic shock: Secondary | ICD-10-CM | POA: Diagnosis present

## 2016-03-20 DIAGNOSIS — E46 Unspecified protein-calorie malnutrition: Secondary | ICD-10-CM | POA: Insufficient documentation

## 2016-03-20 DIAGNOSIS — T148XXA Other injury of unspecified body region, initial encounter: Secondary | ICD-10-CM

## 2016-03-20 DIAGNOSIS — G009 Bacterial meningitis, unspecified: Secondary | ICD-10-CM

## 2016-03-20 DIAGNOSIS — N179 Acute kidney failure, unspecified: Secondary | ICD-10-CM | POA: Diagnosis present

## 2016-03-20 DIAGNOSIS — E860 Dehydration: Secondary | ICD-10-CM | POA: Diagnosis present

## 2016-03-20 DIAGNOSIS — D849 Immunodeficiency, unspecified: Secondary | ICD-10-CM | POA: Diagnosis present

## 2016-03-20 DIAGNOSIS — G8191 Hemiplegia, unspecified affecting right dominant side: Secondary | ICD-10-CM | POA: Diagnosis present

## 2016-03-20 DIAGNOSIS — G060 Intracranial abscess and granuloma: Secondary | ICD-10-CM | POA: Diagnosis present

## 2016-03-20 DIAGNOSIS — Z8674 Personal history of sudden cardiac arrest: Secondary | ICD-10-CM | POA: Diagnosis not present

## 2016-03-20 DIAGNOSIS — G002 Streptococcal meningitis: Secondary | ICD-10-CM | POA: Diagnosis present

## 2016-03-20 DIAGNOSIS — I776 Arteritis, unspecified: Secondary | ICD-10-CM | POA: Diagnosis present

## 2016-03-20 DIAGNOSIS — E870 Hyperosmolality and hypernatremia: Secondary | ICD-10-CM | POA: Diagnosis present

## 2016-03-20 DIAGNOSIS — I669 Occlusion and stenosis of unspecified cerebral artery: Secondary | ICD-10-CM | POA: Diagnosis not present

## 2016-03-20 DIAGNOSIS — Z66 Do not resuscitate: Secondary | ICD-10-CM | POA: Diagnosis present

## 2016-03-20 DIAGNOSIS — I76 Septic arterial embolism: Secondary | ICD-10-CM | POA: Diagnosis present

## 2016-03-20 DIAGNOSIS — G40909 Epilepsy, unspecified, not intractable, without status epilepticus: Secondary | ICD-10-CM | POA: Diagnosis present

## 2016-03-20 DIAGNOSIS — Z681 Body mass index (BMI) 19 or less, adult: Secondary | ICD-10-CM | POA: Diagnosis not present

## 2016-03-20 DIAGNOSIS — Z79899 Other long term (current) drug therapy: Secondary | ICD-10-CM

## 2016-03-20 DIAGNOSIS — G039 Meningitis, unspecified: Secondary | ICD-10-CM

## 2016-03-20 DIAGNOSIS — S00212A Abrasion of left eyelid and periocular area, initial encounter: Secondary | ICD-10-CM | POA: Diagnosis present

## 2016-03-20 DIAGNOSIS — Z7982 Long term (current) use of aspirin: Secondary | ICD-10-CM | POA: Diagnosis not present

## 2016-03-20 DIAGNOSIS — I5042 Chronic combined systolic (congestive) and diastolic (congestive) heart failure: Secondary | ICD-10-CM | POA: Diagnosis present

## 2016-03-20 DIAGNOSIS — G001 Pneumococcal meningitis: Secondary | ICD-10-CM | POA: Insufficient documentation

## 2016-03-20 DIAGNOSIS — W06XXXA Fall from bed, initial encounter: Secondary | ICD-10-CM | POA: Diagnosis present

## 2016-03-20 DIAGNOSIS — I6330 Cerebral infarction due to thrombosis of unspecified cerebral artery: Secondary | ICD-10-CM | POA: Insufficient documentation

## 2016-03-20 DIAGNOSIS — Z8661 Personal history of infections of the central nervous system: Secondary | ICD-10-CM

## 2016-03-20 DIAGNOSIS — A419 Sepsis, unspecified organism: Secondary | ICD-10-CM

## 2016-03-20 DIAGNOSIS — Z515 Encounter for palliative care: Secondary | ICD-10-CM | POA: Diagnosis present

## 2016-03-20 DIAGNOSIS — E785 Hyperlipidemia, unspecified: Secondary | ICD-10-CM | POA: Diagnosis present

## 2016-03-20 DIAGNOSIS — R64 Cachexia: Secondary | ICD-10-CM | POA: Diagnosis present

## 2016-03-20 DIAGNOSIS — Z4659 Encounter for fitting and adjustment of other gastrointestinal appliance and device: Secondary | ICD-10-CM

## 2016-03-20 DIAGNOSIS — R414 Neurologic neglect syndrome: Secondary | ICD-10-CM | POA: Diagnosis present

## 2016-03-20 DIAGNOSIS — I639 Cerebral infarction, unspecified: Secondary | ICD-10-CM | POA: Diagnosis present

## 2016-03-20 DIAGNOSIS — G960 Cerebrospinal fluid leak: Secondary | ICD-10-CM | POA: Diagnosis present

## 2016-03-20 DIAGNOSIS — G9340 Encephalopathy, unspecified: Secondary | ICD-10-CM | POA: Insufficient documentation

## 2016-03-20 DIAGNOSIS — E440 Moderate protein-calorie malnutrition: Secondary | ICD-10-CM | POA: Diagnosis present

## 2016-03-20 DIAGNOSIS — R6520 Severe sepsis without septic shock: Secondary | ICD-10-CM

## 2016-03-20 DIAGNOSIS — W19XXXA Unspecified fall, initial encounter: Secondary | ICD-10-CM

## 2016-03-20 HISTORY — DX: Hypothermia, initial encounter: T68.XXXA

## 2016-03-20 LAB — T4, FREE: Free T4: 0.96 ng/dL (ref 0.61–1.12)

## 2016-03-20 LAB — URINALYSIS, ROUTINE W REFLEX MICROSCOPIC
Bilirubin Urine: NEGATIVE
GLUCOSE, UA: NEGATIVE mg/dL
HGB URINE DIPSTICK: NEGATIVE
Ketones, ur: NEGATIVE mg/dL
Nitrite: NEGATIVE
Protein, ur: NEGATIVE mg/dL
SPECIFIC GRAVITY, URINE: 1.017 (ref 1.005–1.030)
pH: 5 (ref 5.0–8.0)

## 2016-03-20 LAB — URINE MICROSCOPIC-ADD ON: Bacteria, UA: NONE SEEN

## 2016-03-20 LAB — CBC WITH DIFFERENTIAL/PLATELET
BASOS ABS: 0 10*3/uL (ref 0.0–0.1)
Basophils Relative: 1 %
EOS ABS: 0.1 10*3/uL (ref 0.0–0.7)
EOS PCT: 1 %
HCT: 35.9 % — ABNORMAL LOW (ref 39.0–52.0)
Hemoglobin: 11.5 g/dL — ABNORMAL LOW (ref 13.0–17.0)
Lymphocytes Relative: 34 %
Lymphs Abs: 1.9 10*3/uL (ref 0.7–4.0)
MCH: 29.7 pg (ref 26.0–34.0)
MCHC: 32 g/dL (ref 30.0–36.0)
MCV: 92.8 fL (ref 78.0–100.0)
Monocytes Absolute: 0.5 10*3/uL (ref 0.1–1.0)
Monocytes Relative: 8 %
Neutro Abs: 3.1 10*3/uL (ref 1.7–7.7)
Neutrophils Relative %: 56 %
PLATELETS: 336 10*3/uL (ref 150–400)
RBC: 3.87 MIL/uL — AB (ref 4.22–5.81)
RDW: 15.1 % (ref 11.5–15.5)
WBC: 5.6 10*3/uL (ref 4.0–10.5)

## 2016-03-20 LAB — COMPREHENSIVE METABOLIC PANEL
ALBUMIN: 2.9 g/dL — AB (ref 3.5–5.0)
ALT: 40 U/L (ref 17–63)
AST: 28 U/L (ref 15–41)
Alkaline Phosphatase: 69 U/L (ref 38–126)
Anion gap: 12 (ref 5–15)
BUN: 22 mg/dL — AB (ref 6–20)
CHLORIDE: 109 mmol/L (ref 101–111)
CO2: 25 mmol/L (ref 22–32)
CREATININE: 1.43 mg/dL — AB (ref 0.61–1.24)
Calcium: 9.3 mg/dL (ref 8.9–10.3)
GFR calc Af Amer: >60 mL/min (ref >60)
GFR calc non Af Amer: 55 mL/min — ABNORMAL LOW (ref >60)
Glucose, Bld: 87 mg/dL (ref 65–99)
Potassium: 4.2 mmol/L (ref 3.5–5.1)
SODIUM: 146 mmol/L — AB (ref 135–145)
Total Bilirubin: 0.5 mg/dL (ref 0.3–1.2)
Total Protein: 6.8 g/dL (ref 6.5–8.1)

## 2016-03-20 LAB — PROTEIN, CSF: Total  Protein, CSF: >600 mg/dL — ABNORMAL HIGH (ref 15–45)

## 2016-03-20 LAB — I-STAT CG4 LACTIC ACID, ED
Lactic Acid, Venous: 1.38 mmol/L (ref 0.5–2.0)
Lactic Acid, Venous: 0.47 mmol/L — ABNORMAL LOW (ref 0.5–2.0)

## 2016-03-20 LAB — I-STAT TROPONIN, ED
TROPONIN I, POC: 0 ng/mL (ref 0.00–0.08)
Troponin i, poc: 0 ng/mL (ref 0.00–0.08)

## 2016-03-20 LAB — TSH: TSH: 2.712 u[IU]/mL (ref 0.350–4.500)

## 2016-03-20 LAB — CBG MONITORING, ED: GLUCOSE-CAPILLARY: 81 mg/dL (ref 65–99)

## 2016-03-20 LAB — BRAIN NATRIURETIC PEPTIDE: B Natriuretic Peptide: 23.8 pg/mL (ref 0.0–100.0)

## 2016-03-20 LAB — GLUCOSE, CSF: GLUCOSE CSF: 37 mg/dL — AB (ref 40–70)

## 2016-03-20 MED ORDER — DEXTROSE 50 % IV SOLN
1.0000 | Freq: Once | INTRAVENOUS | Status: AC
  Administered 2016-03-20: 50 mL via INTRAVENOUS
  Filled 2016-03-20: qty 50

## 2016-03-20 MED ORDER — VANCOMYCIN HCL 500 MG IV SOLR
500.0000 mg | Freq: Two times a day (BID) | INTRAVENOUS | Status: DC
  Administered 2016-03-21 – 2016-03-23 (×6): 500 mg via INTRAVENOUS
  Filled 2016-03-20 (×9): qty 500

## 2016-03-20 MED ORDER — SODIUM CHLORIDE 0.9 % IV SOLN: 250.0000 mL | INTRAVENOUS | Status: DC | PRN

## 2016-03-20 MED ORDER — SODIUM CHLORIDE 0.9 % IV BOLUS (SEPSIS)
1000.0000 mL | Freq: Once | INTRAVENOUS | Status: AC
  Administered 2016-03-20: 1000 mL via INTRAVENOUS

## 2016-03-20 MED ORDER — ENOXAPARIN SODIUM 40 MG/0.4ML ~~LOC~~ SOLN
40.0000 mg | SUBCUTANEOUS | Status: DC
  Administered 2016-03-20 – 2016-04-04 (×15): 40 mg via SUBCUTANEOUS
  Filled 2016-03-20 (×17): qty 0.4

## 2016-03-20 MED ORDER — VANCOMYCIN HCL IN DEXTROSE 1-5 GM/200ML-% IV SOLN
1000.0000 mg | INTRAVENOUS | Status: AC
  Administered 2016-03-20: 1000 mg via INTRAVENOUS
  Filled 2016-03-20: qty 200

## 2016-03-20 MED ORDER — CETYLPYRIDINIUM CHLORIDE 0.05 % MT LIQD
7.0000 mL | Freq: Two times a day (BID) | OROMUCOSAL | Status: DC
  Administered 2016-03-21 – 2016-03-22 (×4): 7 mL via OROMUCOSAL

## 2016-03-20 MED ORDER — DEXTROSE 5 % IV SOLN
2.0000 g | Freq: Once | INTRAVENOUS | Status: AC
  Administered 2016-03-20: 2 g via INTRAVENOUS
  Filled 2016-03-20: qty 2

## 2016-03-20 NOTE — ED Notes (Signed)
CBG 81.  

## 2016-03-20 NOTE — ED Notes (Signed)
Alvira MondayErin Schlossman MD at bedside to do Lumbar puncture

## 2016-03-20 NOTE — ED Notes (Signed)
Dr. Dalene SeltzerSchlossman at bedside evaluating pt condition.

## 2016-03-20 NOTE — ED Notes (Signed)
[facility]. Pt had an unwitnessed fall today from his bed to the floor. Pt sustained an abrasion to the left eyebrow and left temple. Pt is nonverbal and nonambulatory at baseline following bacterial meningitis.

## 2016-03-20 NOTE — H&P (Signed)
PULMONARY / CRITICAL CARE MEDICINE   Name: Bonnita HollowCraig D Solazzo MRN: [id number] DOB: 07/05/1966    ADMISSION DATE:  03/20/2016 CONSULTATION DATE:  03/20/16  REFERRING MD:  EDP  CHIEF COMPLAINT:  Hypothermia, fall from bed  HISTORY OF PRESENT ILLNESS:   [name] is an unfortunate 66M who presents from the nursing home after being found down on the floor after falling from bed and sustaining abrasions to his forehead. On arrival to the ED he was noted to be markedly hypothermic with core temp 91.53F. Reportedly at baseline he is non-verbal and full assist since being discharged after a long hospitalization for pneumococcal meningitis. He was discharged 03/10/16.  According to nursing home staff, he is at his baseline with regards to mental status. He is unable to provide any history.  The ED physician spoke with his wife, who reports that he was "doing better" and wishes for him to be a full code this admission.   PAST MEDICAL HISTORY :  He  has a past medical history of Depression; Cardiac arrest (HCC); Cardiogenic shock (HCC); Acute respiratory failure with hypoxia (HCC); Meningitis, streptococcal (02/16/2016); Trichomonal urethritis in male (02/16/2016); Tobacco abuse (02/16/2016); Substance abuse; ETOH abuse; and Septic shock (HCC).  PAST SURGICAL HISTORY: He  has past surgical history that includes Ankle surgery and Cardiac catheterization (N/A, 02/17/2016).  No Known Allergies  No current facility-administered medications on file prior to encounter.   Current Outpatient Prescriptions on File Prior to Encounter  Medication Sig  . acetaminophen (TYLENOL) 325 MG tablet Take 650 mg by mouth every 6 (six) hours as needed (pain/fever).   Marland Kitchen. albuterol (PROVENTIL) (2.5 MG/3ML) 0.083% nebulizer solution Take 3 mLs (2.5 mg total) by nebulization every 3 (three) hours as needed for wheezing. (Patient taking differently: Take 2.5 mg by nebulization every 3 (three) hours as needed for wheezing or shortness of  breath. )  . amiodarone (PACERONE) 200 MG tablet Take 1 tablet (200 mg total) by mouth daily.  Marland Kitchen. aspirin 81 MG chewable tablet Chew 1 tablet (81 mg total) by mouth daily.  . carvedilol (COREG) 6.25 MG tablet Take 1 tablet (6.25 mg total) by mouth 2 (two) times daily with a meal.  . folic acid (FOLVITE) 1 MG tablet Take 1 tablet (1 mg total) by mouth daily.  . pantoprazole (PROTONIX) 40 MG tablet Take 40 mg by mouth daily.  Marland Kitchen. thiamine 100 MG tablet Take 1 tablet (100 mg total) by mouth daily.  . traMADol (ULTRAM) 50 MG tablet Take 1 tablet (50 mg total) by mouth every 6 (six) hours as needed. (Patient taking differently: Take 50 mg by mouth every 6 (six) hours as needed (pain). )    FAMILY HISTORY:  His has no family status information on file.   SOCIAL HISTORY: He  reports that he has been smoking Cigarettes.  He has been smoking about 0.10 packs per day. He does not have any smokeless tobacco history on file. He reports that he drinks alcohol. He reports that he uses illicit drugs (Marijuana).  REVIEW OF SYSTEMS:   Unable to obtain 2/2 mental status.  VITAL SIGNS: BP 89/63 mmHg  Pulse 82  Temp(Src) 94.4 F (34.7 C) (Rectal)  Resp 16  SpO2 100%  HEMODYNAMICS:     VENTILATOR SETTINGS:    INTAKE / OUTPUT:    PHYSICAL EXAMINATION:  General Well nourished, well developed, thin, no apparent distress, non-verbal, responsive to noxious stimuli.  HEENT No gross abnormalities. Oropharynx not assessed 2/2 lack of patient  cooperation. Bandage over tracheotomy site c/d/i.   Pulmonary Clear to auscultation bilaterally with no wheezes, rales or ronchi. Good effort, symmetrical expansion.   Cardiovascular Normal rate, regular rhythm. S1, s2. No m/r/g. Distal pulses palpable.  Abdomen Soft, non-tender, non-distended, positive bowel sounds, no palpable organomegaly or masses. Normoresonant to percussion.  Musculoskeletal Normal bulk and tone. Moves all extremities. No bony abnormalities.    Lymphatics No cervical, supraclavicular or axillary adenopathy.   Neurologic Grossly intact. No focal deficits.   Skin/Integuement No rash, no cyanosis, no clubbing. L hand somewhat edematous. No lower extremity edema.      LABS:  BMET  Recent Labs Lab 03/20/16 1800  NA 146*  K 4.2  CL 109  CO2 25  BUN 22*  CREATININE 1.43*  GLUCOSE 87    Electrolytes  Recent Labs Lab 03/20/16 1800  CALCIUM 9.3    CBC  Recent Labs Lab 03/20/16 1800  WBC 5.6  HGB 11.5*  HCT 35.9*  PLT 336    Coag's No results for input(s): APTT, INR in the last 168 hours.  Sepsis Markers  Recent Labs Lab 03/20/16 1824 03/20/16 2044  LATICACIDVEN 1.38 0.47*    ABG No results for input(s): PHART, PCO2ART, PO2ART in the last 168 hours.  Liver Enzymes  Recent Labs Lab 03/20/16 1800  AST 28  ALT 40  ALKPHOS 69  BILITOT 0.5  ALBUMIN 2.9*    Cardiac Enzymes No results for input(s): TROPONINI, PROBNP in the last 168 hours.  Glucose  Recent Labs Lab 03/20/16 1905  GLUCAP 81    Imaging Ct Head Wo Contrast  03/20/2016  CLINICAL DATA:  Patient status post witnessed fall. Abrasion to the left periorbital soft tissues. EXAM: CT HEAD WITHOUT CONTRAST CT CERVICAL SPINE WITHOUT CONTRAST TECHNIQUE: Multidetector CT imaging of the head and cervical spine was performed following the standard protocol without intravenous contrast. Multiplanar CT image reconstructions of the cervical spine were also generated. COMPARISON:  Brain CT 02/17/2016; 02/16/2016. FINDINGS: CT HEAD FINDINGS Ventricles and sulci are appropriate for patient's age. Interval development of left-greater-than-right basal ganglia hypodensities. No evidence for acute intracranial hemorrhage, mass lesion or mass-effect. Orbits are unremarkable. Paranasal sinuses are well aerated. Mucosal thickening left maxillary sinus. Mastoid air cells are unremarkable. Calvarium is intact. CT CERVICAL SPINE FINDINGS Normal anatomic  alignment. No evidence for acute cervical spine fracture. Craniocervical junction is intact. Preservation of the vertebral body and intervertebral disc space heights. 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 Lung apices are clear. Thyroid is unremarkable. Prevertebral soft tissues unremarkable. IMPRESSION: Interval development of hypodensities within the left-greater-than-right basal ganglia which may represent subacute infarcts. No acute cervical spine fracture. Electronically Signed   By: Annia Belt M.D.   On: 03/20/2016 19:15   Ct Cervical Spine Wo Contrast  03/20/2016  CLINICAL DATA:  Patient status post witnessed fall. Abrasion to the left periorbital soft tissues. EXAM: CT HEAD WITHOUT CONTRAST CT CERVICAL SPINE WITHOUT CONTRAST TECHNIQUE: Multidetector CT imaging of the head and cervical spine was performed following the standard protocol without intravenous contrast. Multiplanar CT image reconstructions of the cervical spine were also generated. COMPARISON:  Brain CT 02/17/2016; 02/16/2016. FINDINGS: CT HEAD FINDINGS Ventricles and sulci are appropriate for patient's age. Interval development of left-greater-than-right basal ganglia hypodensities. No evidence for acute intracranial hemorrhage, mass lesion or mass-effect. Orbits are unremarkable. Paranasal sinuses are well aerated. Mucosal thickening left maxillary sinus. Mastoid air cells are unremarkable. Calvarium is intact. CT CERVICAL SPINE FINDINGS Normal anatomic alignment. No evidence  for acute cervical spine fracture. Craniocervical junction is intact. Preservation of the vertebral body and intervertebral disc space heights. 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 Lung apices are clear. Thyroid is unremarkable. Prevertebral soft tissues unremarkable. IMPRESSION: Interval development of hypodensities within the left-greater-than-right basal ganglia which may represent subacute infarcts. No acute cervical spine fracture. Electronically  Signed   By: Annia Belt M.D.   On: 03/20/2016 19:15   Dg Chest Portable 1 View  03/20/2016  CLINICAL DATA:  Recent fall with altered mental status, initial encounter EXAM: PORTABLE CHEST 1 VIEW COMPARISON:  03/05/2016 FINDINGS: The heart size and mediastinal contours are within normal limits. Both lungs are clear. The visualized skeletal structures are unremarkable. IMPRESSION: No active disease. Electronically Signed   By: Alcide Clever M.D.   On: 03/20/2016 18:16    STUDIES:    CULTURES: CSF 4/16 Blood 4/16  ANTIBIOTICS: Vancomycin Rocephin  SIGNIFICANT EVENTS:   LINES/TUBES: PIV  DISCUSSION: Mr. Consalvo is an unfortunate gentleman who was found down and hypothermic. His baseline mental status and function is poor, but reportedly unchanged. His blood pressures have been borderline and may worsen as he re-warms. Sepsis remains on the differential, although he has a normal white count and differential. Blood and CSF cultures are pending and he will be empirically covered for possible recurrent meningitis. TSH was adequate.  ASSESSMENT / PLAN:  PULMONARY A: No acute issues P:   Monitor  CARDIOVASCULAR A:  Borderline blood pressures  P:  Fluid resuscitate Maintain MAP >65   RENAL A:   No acute issues P: Monitor  GASTROINTESTINAL A:   No acute issues P:   NPO for now  HEMATOLOGIC A:   No acute issues P:  Trend CBC  INFECTIOUS A:   Severe sepsis, concern for recurrent meningitis P:   Empiric coverage with vanc/rocephin Await Blood, CSF cultures  ENDOCRINE A:   Hypothermia P:   Warm with IVF, warming blankets  NEUROLOGIC A:   Concern for recurrent meningitis P:   RASS goal: 0 Await CSF studies Empiric coverage with vanc/rocephin - meningitis dosing   FAMILY  - Updates: No family at bedside  - Inter-disciplinary family meet or Palliative Care meeting due by:  day 7  The patient is critically ill with multiple organ system failure and  requires high complexity decision making for assessment and support, frequent evaluation and titration of therapies, advanced monitoring, review of radiographic studies and interpretation of complex data.   Critical Care Time devoted to patient care services, exclusive of separately billable procedures, described in this note is 40 minutes.   Nita Sickle, MD Pulmonary and Critical Care Medicine [facility] Pager: [phone]  03/20/2016, 9:35 PM

## 2016-03-20 NOTE — ED Notes (Signed)
IV team at bedside 

## 2016-03-20 NOTE — Progress Notes (Signed)
Pharmacy Antibiotic Note  Dale [name] is a 50 y.o. male admitted on 03/20/2016 with sepsis.  Pharmacy has been consulted for vanc dosing. 50 yo who was recently here with prolonged hospitalization for strep meningitis. He had an unwitnessed fall today at the SNF. Vanc has been ordered to r/o sepsis.   Plan:  Vanc 1g x 1 then 500mg  IV q12 F/u with level as needed    Temp (24hrs), Avg:91.6 F (33.1 C), Min:91 F (32.8 C), Max:92.1 F (33.4 C)   Recent Labs Lab 03/20/16 1800 03/20/16 1824  WBC 5.6  --   CREATININE 1.43*  --   LATICACIDVEN  --  1.38    Estimated Creatinine Clearance: 48.4 mL/min (by C-G formula based on Cr of 1.43).    No Known Allergies  Antimicrobials this admission: 4/16 vanc >>  4/16 rocephin  x1  Dose adjustments this admission:   Microbiology results: 4/16 BCx:  UCx:    Sputum:    MRSA PCR:   Dale [name], PharmD Pager: [phone] 03/20/2016 7:37 PM

## 2016-03-20 NOTE — ED Notes (Signed)
Patient transported to CT 

## 2016-03-20 NOTE — ED Notes (Signed)
Dale MeuseStevens MD at bedside.

## 2016-03-20 NOTE — ED Provider Notes (Signed)
CSN: [id number]     Arrival date & time 03/20/16  1706 History   First MD Initiated Contact with Patient 03/20/16 1724     Chief Complaint  Patient presents with  . Fall     (Consider location/radiation/quality/duration/timing/severity/associated sxs/prior Treatment) HPI Comments: Baseline per facility, nonverbal, does not follow commands, will sometimes try to get up, or sleeping most of the time, opens eyes, localize to pain, tries to stand up and walk, can't by himself but can with assistance, report was not changed from baseline today Unwitnessed fall, not sure how long he was down was wearing gown, but presumably not long Temperature 97 degrees yesterday 1615 today was 97.7  Per facility, pt sent for unwitnessed fall, without other change from baseline.  Deny known recent symptoms of infection or other changes.  Was found on ground with abrasion above his right eye.  Report he frequently tries to get out of bed when he is awake but is unable to ambulate on his own.   Called his wife to discuss the patient, and although patient came from the facility with the DNR form, wife reports that he is full code, and that we should use all means possible in order to keep him alive including intubation and central lines. Wife reports that he has been improving at the facility, and even reports that said his name on the phone with a family member.       Patient is a 50 y.o. male presenting with fall.  Fall    Past Medical History  Diagnosis Date  . Depression   . Cardiac arrest (HCC)   . Cardiogenic shock (HCC)   . Acute respiratory failure with hypoxia (HCC)   . Meningitis, streptococcal 02/16/2016  . 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 Tobacco abuse 02/16/2016  . Substance abuse   . ETOH abuse   . Septic shock [facility])    Past Surgical History  Procedure Laterality Date  . Ankle surgery    . Cardiac catheterization N/A 02/17/2016    Procedure: IABP Insertion;  Surgeon:  Iran Ouch, MD;  Location: [facility] INVASIVE CV LAB;  Service: Cardiovascular;  Laterality: N/A;   History reviewed. No pertinent family history. Social History  Substance Use Topics  . Smoking status: Current Every Day Smoker -- 0.10 packs/day    Types: Cigarettes  . Smokeless tobacco: None  . Alcohol Use: Yes    Review of Systems  Unable to perform ROS: Patient nonverbal      Allergies  Review of patient's allergies indicates no known allergies.  Home Medications   Prior to Admission medications   Medication Sig Start Date End Date Taking? Authorizing Provider  acetaminophen (TYLENOL) 325 MG tablet Take 650 mg by mouth every 6 (six) hours as needed (pain/fever).    Yes Historical Provider, MD  albuterol (PROVENTIL) (2.5 MG/3ML) 0.083% nebulizer solution Take 3 mLs (2.5 mg total) by nebulization every 3 (three) hours as needed for wheezing. Patient taking differently: Take 2.5 mg by nebulization every 3 (three) hours as needed for wheezing or shortness of breath.  03/10/16  Yes Richarda Overlie, MD  amiodarone (PACERONE) 200 MG tablet Take 1 tablet (200 mg total) by mouth daily. 03/10/16  Yes Richarda Overlie, MD  aspirin 81 MG chewable tablet Chew 1 tablet (81 mg total) by mouth daily. 03/10/16  Yes Richarda Overlie, MD  carvedilol (COREG) 6.25 MG tablet Take 1 tablet (6.25 mg total) by mouth 2 (two) times daily with a meal. 03/10/16  Yes Richarda Overlie, MD  folic acid (FOLVITE) 1 MG tablet Take 1 tablet (1 mg total) by mouth daily. 03/10/16  Yes Richarda Overlie, MD  Nutritional Supplements (NUTRITIONAL SUPPLEMENT PO) Take 1 each by mouth 3 (three) times daily. 2 calorie milk shake   Yes Historical Provider, MD  pantoprazole (PROTONIX) 40 MG tablet Take 40 mg by mouth daily.   Yes Historical Provider, MD  thiamine 100 MG tablet Take 1 tablet (100 mg total) by mouth daily. 03/10/16  Yes Richarda Overlie, MD  traMADol (ULTRAM) 50 MG tablet Take 1 tablet (50 mg total) by mouth every 6 (six) hours as needed. Patient  taking differently: Take 50 mg by mouth every 6 (six) hours as needed (pain).  03/10/16  Yes Richarda Overlie, MD   BP 86/72 mmHg  Pulse 78  Temp(Src) 95.8 F (35.4 C) (Axillary)  Resp 15  Ht 5\' 8"  (1.727 m)  Wt 130 lb 1.1 oz (59 kg)  BMI 19.78 kg/m2  SpO2 100% Physical Exam  Constitutional: He appears listless. He appears cachectic. He appears ill. No distress.  HENT:  Head: Normocephalic. Head is with abrasion (right supraorbital).  Eyes: Conjunctivae and EOM are normal. Pupils are equal, round, and reactive to light.  miosis  Neck: No rigidity.  Cardiovascular: Normal rate, regular rhythm, normal heart sounds and intact distal pulses.  Exam reveals no gallop and no friction rub.   No murmur heard. Pulmonary/Chest: Effort normal and breath sounds normal. No respiratory distress. He has no wheezes. He has no rales.  Abdominal: Soft. He exhibits no distension. There is no tenderness. There is no guarding.  Musculoskeletal: He exhibits no edema.  Neurological: He appears listless. GCS eye subscore is 3. GCS verbal subscore is 1. GCS motor subscore is 5.  Skin: Skin is warm and dry. He is not diaphoretic.  Nursing note and vitals reviewed.   ED Course  .Lumbar Puncture Date/Time: 03/21/2016 2:30 AM Performed by: Alvira Monday Authorized by: Alvira Monday Consent: The procedure was performed in an emergent situation. Required items: required blood products, implants, devices, and special equipment available Patient identity confirmed: arm band Time out: Immediately prior to procedure a "time out" was called to verify the correct patient, procedure, equipment, support staff and site/side marked as required. Anesthesia: local infiltration Local anesthetic: lidocaine 1% with epinephrine Preparation: Patient was prepped and draped in the usual sterile fashion. Patient's position: right lateral decubitus Number of attempts: 2 Fluid appearance: xanthochromic Tubes of fluid: 4 Total  volume: 6 ml Post-procedure: site cleaned, pressure dressing applied and adhesive bandage applied Patient tolerance: Patient tolerated the procedure well with no immediate complications   (including critical care time) Labs Review Labs Reviewed  MRSA PCR SCREENING - Abnormal; Notable for the following:    MRSA by PCR POSITIVE (*)    All other components within normal limits  CBC WITH DIFFERENTIAL/PLATELET - Abnormal; Notable for the following:    RBC 3.87 (*)    Hemoglobin 11.5 (*)    HCT 35.9 (*)    All other components within normal limits  COMPREHENSIVE METABOLIC PANEL - Abnormal; Notable for the following:    Sodium 146 (*)    BUN 22 (*)    Creatinine, Ser 1.43 (*)    Albumin 2.9 (*)    GFR calc non Af Amer 55 (*)    All other components within normal limits  URINALYSIS, ROUTINE W REFLEX MICROSCOPIC (NOT AT [facility]) - Abnormal; Notable for the following:    Leukocytes, UA  TRACE (*)    All other components within normal limits  URINE MICROSCOPIC-ADD ON - Abnormal; Notable for the following:    Squamous Epithelial / LPF 0-5 (*)    All other components within normal limits  CSF CELL COUNT WITH DIFFERENTIAL - Abnormal; Notable for the following:    Color, CSF YELLOW (*)    Appearance, CSF HAZY (*)    RBC Count, CSF 73 (*)    WBC, CSF 200 (*)    Segmented Neutrophils-CSF 78 (*)    Lymphs, CSF 7 (*)    All other components within normal limits  CSF CELL COUNT WITH DIFFERENTIAL - Abnormal; Notable for the following:    Color, CSF YELLOW (*)    Appearance, CSF HAZY (*)    RBC Count, CSF 3 (*)    WBC, CSF 400 (*)    Segmented Neutrophils-CSF 75 (*)    Lymphs, CSF 9 (*)    All other components within normal limits  GLUCOSE, CSF - Abnormal; Notable for the following:    Glucose, CSF 37 (*)    All other components within normal limits  PROTEIN, CSF - Abnormal; Notable for the following:    Total  Protein, CSF >600 (*)    All other components within normal limits  GLUCOSE,  CAPILLARY - Abnormal; Notable for the following:    Glucose-Capillary 121 (*)    All other components within normal limits  I-STAT CG4 LACTIC ACID, ED - Abnormal; Notable for the following:    Lactic Acid, Venous 0.47 (*)    All other components within normal limits  CSF CULTURE  CULTURE, BLOOD (ROUTINE X 2)  CULTURE, BLOOD (ROUTINE X 2)  BRAIN NATRIURETIC PEPTIDE  TSH  T4, FREE  GLUCOSE, CAPILLARY  CBC  BASIC METABOLIC PANEL  MAGNESIUM  PHOSPHORUS  TROPONIN I  TROPONIN I  TROPONIN I  I-STAT CG4 LACTIC ACID, ED  I-STAT TROPOININ, ED  CBG MONITORING, ED  I-STAT TROPOININ, ED    Imaging Review Ct Head Wo Contrast  03/20/2016  CLINICAL DATA:  Patient status post witnessed fall. Abrasion to the left periorbital soft tissues. EXAM: CT HEAD WITHOUT CONTRAST CT CERVICAL SPINE WITHOUT CONTRAST TECHNIQUE: Multidetector CT imaging of the head and cervical spine was performed following the standard protocol without intravenous contrast. Multiplanar CT image reconstructions of the cervical spine were also generated. COMPARISON:  Brain CT 02/17/2016; 02/16/2016. FINDINGS: CT HEAD FINDINGS Ventricles and sulci are appropriate for patient's age. Interval development of left-greater-than-right basal ganglia hypodensities. No evidence for acute intracranial hemorrhage, mass lesion or mass-effect. Orbits are unremarkable. Paranasal sinuses are well aerated. Mucosal thickening left maxillary sinus. Mastoid air cells are unremarkable. Calvarium is intact. CT CERVICAL SPINE FINDINGS Normal anatomic alignment. No evidence for acute cervical spine fracture. Craniocervical junction is intact. Preservation of the vertebral body and intervertebral disc space heights. 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 Lung apices are clear. Thyroid is unremarkable. Prevertebral soft tissues unremarkable. IMPRESSION: Interval development of hypodensities within the left-greater-than-right basal ganglia which may represent  subacute infarcts. No acute cervical spine fracture. Electronically Signed   By: Annia Belt M.D.   On: 03/20/2016 19:15   Ct Cervical Spine Wo Contrast  03/20/2016  CLINICAL DATA:  Patient status post witnessed fall. Abrasion to the left periorbital soft tissues. EXAM: CT HEAD WITHOUT CONTRAST CT CERVICAL SPINE WITHOUT CONTRAST TECHNIQUE: Multidetector CT imaging of the head and cervical spine was performed following the standard protocol without intravenous contrast. Multiplanar CT image reconstructions of  the cervical spine were also generated. COMPARISON:  Brain CT 02/17/2016; 02/16/2016. FINDINGS: CT HEAD FINDINGS Ventricles and sulci are appropriate for patient's age. Interval development of left-greater-than-right basal ganglia hypodensities. No evidence for acute intracranial hemorrhage, mass lesion or mass-effect. Orbits are unremarkable. Paranasal sinuses are well aerated. Mucosal thickening left maxillary sinus. Mastoid air cells are unremarkable. Calvarium is intact. CT CERVICAL SPINE FINDINGS Normal anatomic alignment. No evidence for acute cervical spine fracture. Craniocervical junction is intact. Preservation of the vertebral body and intervertebral disc space heights. 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 Lung apices are clear. Thyroid is unremarkable. Prevertebral soft tissues unremarkable. IMPRESSION: Interval development of hypodensities within the left-greater-than-right basal ganglia which may represent subacute infarcts. No acute cervical spine fracture. Electronically Signed   By: Annia Belt M.D.   On: 03/20/2016 19:15   Dg Chest Portable 1 View  03/20/2016  CLINICAL DATA:  Recent fall with altered mental status, initial encounter EXAM: PORTABLE CHEST 1 VIEW COMPARISON:  03/05/2016 FINDINGS: The heart size and mediastinal contours are within normal limits. Both lungs are clear. The visualized skeletal structures are unremarkable. IMPRESSION: No active disease. Electronically  Signed   By: Alcide Clever M.D.   On: 03/20/2016 18:16   I have personally reviewed and evaluated these images and lab results as part of my medical decision-making.   EKG Interpretation None       CRITICAL CARE:Sepsis Performed by: Rhae Lerner   Total critical care time: 30 minutes  Critical care time was exclusive of separately billable procedures and treating other patients.  Critical care was necessary to treat or prevent imminent or life-threatening deterioration.  Critical care was time spent personally by me on the following activities: development of treatment plan with patient and/or surrogate as well as nursing, discussions with consultants, evaluation of patient's response to treatment, examination of patient, obtaining history from patient or surrogate, ordering and performing treatments and interventions, ordering and review of laboratory studies, ordering and review of radiographic studies, pulse oximetry and re-evaluation of patient's condition.   MDM   Final diagnoses:  Hypothermia, initial encounter  Hypotension, unspecified hypotension type  Sepsis, due to unspecified organism [facility])   50 year old male with a history of admission March 14 for concern of streptococcal meningitis, complicated by sepsis, cardiogenic shock, cardiac arrest with ischemic brain injury, acute respiratory failure and hypoxia requiring temporary tracheostomy, who presents from a skilled nursing facility for concern of fall.  I called the facility and spoke with one of the nurses who has taken care of the patient, who reported that he was nonverbal at baseline, slept much of the day, did not follow commands, however at times when he was awake, and would try to get out of bed and was able to ambulate with assistance. On arrival to the emergency department, patient was presumed to be at his mental baseline per history from facility.  His temperature was found to be 91 rectally. Given  patient had a history of unwitnessed fall, with unclear down time, otherwise normal vital signs, including normal pressure, no leukocytosis, normal lactic acid, this was initially thought to be possibly environmental. Bair hugger was placed in warm saline was initiated.  However, given his history and hypothermia, empiric antibiotics including vancomycin and rocephin were ordered.  Infectious workup showed no sign of UTI, no sign of pneumonia. Given patient's fall, had an cervical spine CT were ordered which showed no evidence of acute abnormalities.  As the warm saline was infusing, patient's  blood pressures became hypotensive with systolics in the 80s however normal mean arterial pressures. Unclear whether hypotension is secondary to warming process/vasodilation or sepsis.  Called his wife to discuss the patient, and although patient came from the facility with the DNR form, wife reports that he is FULL CODE and that we should use all means possible in order to keep him alive including intubation and central lines. Wife reports that he has been improving at the facility, and even reports that he said his name on the phone with a family member.    Given the possibility of sepsis with possible altered mental status from baseline, performed lumbar puncture. Empiric abx had already finished infusing prior to LP (with vancomycin completing just prior to LP, and rocephin 2g completed approx 1hr or more.) Other etiology of AMS could by hypothermia or seizure/post-ictal state.  Critical care was consulted given patient's hypotension and hypothermia. Patient received 4 L of normal saline with continuing low systolic blood pressures and patient was admitted to the ICU for further care.      Alvira MondayErin Aeisha Minarik, MD 03/21/16 [phone]

## 2016-03-21 ENCOUNTER — Inpatient Hospital Stay (HOSPITAL_COMMUNITY): Payer: Medicaid Other

## 2016-03-21 DIAGNOSIS — R6520 Severe sepsis without septic shock: Secondary | ICD-10-CM

## 2016-03-21 DIAGNOSIS — G0490 Encephalitis and encephalomyelitis, unspecified: Secondary | ICD-10-CM

## 2016-03-21 DIAGNOSIS — G9340 Encephalopathy, unspecified: Secondary | ICD-10-CM

## 2016-03-21 DIAGNOSIS — A419 Sepsis, unspecified organism: Principal | ICD-10-CM

## 2016-03-21 DIAGNOSIS — R930 Abnormal findings on diagnostic imaging of skull and head, not elsewhere classified: Secondary | ICD-10-CM

## 2016-03-21 DIAGNOSIS — G009 Bacterial meningitis, unspecified: Secondary | ICD-10-CM

## 2016-03-21 LAB — GLUCOSE, CAPILLARY
GLUCOSE-CAPILLARY: 121 mg/dL — AB (ref 65–99)
GLUCOSE-CAPILLARY: 121 mg/dL — AB (ref 65–99)
GLUCOSE-CAPILLARY: 64 mg/dL — AB (ref 65–99)
GLUCOSE-CAPILLARY: 65 mg/dL (ref 65–99)
GLUCOSE-CAPILLARY: 73 mg/dL (ref 65–99)
GLUCOSE-CAPILLARY: 73 mg/dL (ref 65–99)
GLUCOSE-CAPILLARY: 75 mg/dL (ref 65–99)
Glucose-Capillary: 73 mg/dL (ref 65–99)

## 2016-03-21 LAB — CSF CELL COUNT WITH DIFFERENTIAL
Eosinophils, CSF: 0 % (ref 0–1)
Eosinophils, CSF: 0 % (ref 0–1)
Lymphs, CSF: 7 % — ABNORMAL LOW (ref 40–80)
Lymphs, CSF: 9 % — ABNORMAL LOW (ref 40–80)
Monocyte-Macrophage-Spinal Fluid: 15 % (ref 15–45)
Monocyte-Macrophage-Spinal Fluid: 16 % (ref 15–45)
RBC Count, CSF: 3 /mm3 — ABNORMAL HIGH
RBC Count, CSF: 73 /mm3 — ABNORMAL HIGH
Segmented Neutrophils-CSF: 75 % — ABNORMAL HIGH (ref 0–6)
Segmented Neutrophils-CSF: 78 % — ABNORMAL HIGH (ref 0–6)
Tube #: 1
Tube #: 4
WBC, CSF: 200 /mm3 (ref 0–5)
WBC, CSF: 400 /mm3 (ref 0–5)

## 2016-03-21 LAB — TROPONIN I
Troponin I: <0.03 ng/mL (ref <0.031)
Troponin I: <0.03 ng/mL (ref <0.031)

## 2016-03-21 LAB — CBC
HCT: 32.8 % — ABNORMAL LOW (ref 39.0–52.0)
Hemoglobin: 10.3 g/dL — ABNORMAL LOW (ref 13.0–17.0)
MCH: 28.9 pg (ref 26.0–34.0)
MCHC: 31.4 g/dL (ref 30.0–36.0)
MCV: 91.9 fL (ref 78.0–100.0)
Platelets: 307 10*3/uL (ref 150–400)
RBC: 3.57 MIL/uL — ABNORMAL LOW (ref 4.22–5.81)
RDW: 14.8 % (ref 11.5–15.5)
WBC: 6.2 10*3/uL (ref 4.0–10.5)

## 2016-03-21 LAB — BASIC METABOLIC PANEL
Anion gap: 9 (ref 5–15)
BUN: 24 mg/dL — ABNORMAL HIGH (ref 6–20)
CO2: 21 mmol/L — ABNORMAL LOW (ref 22–32)
Calcium: 8 mg/dL — ABNORMAL LOW (ref 8.9–10.3)
Chloride: 120 mmol/L — ABNORMAL HIGH (ref 101–111)
Creatinine, Ser: 1.39 mg/dL — ABNORMAL HIGH (ref 0.61–1.24)
GFR calc Af Amer: >60 mL/min (ref >60)
GFR calc non Af Amer: 57 mL/min — ABNORMAL LOW (ref >60)
Glucose, Bld: 59 mg/dL — ABNORMAL LOW (ref 65–99)
Potassium: 4.3 mmol/L (ref 3.5–5.1)
Sodium: 150 mmol/L — ABNORMAL HIGH (ref 135–145)

## 2016-03-21 LAB — PATHOLOGIST SMEAR REVIEW: Path Review: INCREASED

## 2016-03-21 LAB — MRSA PCR SCREENING: MRSA BY PCR: POSITIVE — AB

## 2016-03-21 LAB — PHOSPHORUS: Phosphorus: 4.1 mg/dL (ref 2.5–4.6)

## 2016-03-21 LAB — MAGNESIUM: MAGNESIUM: 1.7 mg/dL (ref 1.7–2.4)

## 2016-03-21 MED ORDER — DEXTROSE 50 % IV SOLN
25.0000 mL | Freq: Once | INTRAVENOUS | Status: AC
  Administered 2016-03-21: 25 mL via INTRAVENOUS
  Filled 2016-03-21: qty 50

## 2016-03-21 MED ORDER — INSULIN ASPART 100 UNIT/ML ~~LOC~~ SOLN
0.0000 [IU] | SUBCUTANEOUS | Status: DC
  Administered 2016-03-22 – 2016-03-24 (×6): 1 [IU] via SUBCUTANEOUS

## 2016-03-21 MED ORDER — CHLORHEXIDINE GLUCONATE CLOTH 2 % EX PADS
6.0000 | MEDICATED_PAD | Freq: Every day | CUTANEOUS | Status: AC
  Administered 2016-03-21 – 2016-03-25 (×5): 6 via TOPICAL

## 2016-03-21 MED ORDER — GADOBENATE DIMEGLUMINE 529 MG/ML IV SOLN
13.0000 mL | Freq: Once | INTRAVENOUS | Status: AC | PRN
  Administered 2016-03-21: 13 mL via INTRAVENOUS

## 2016-03-21 MED ORDER — DEXTROSE-NACL 5-0.45 % IV SOLN
INTRAVENOUS | Status: DC
  Administered 2016-03-21 – 2016-03-24 (×3): via INTRAVENOUS

## 2016-03-21 MED ORDER — MUPIROCIN 2 % EX OINT
1.0000 "application " | TOPICAL_OINTMENT | Freq: Two times a day (BID) | CUTANEOUS | Status: AC
  Administered 2016-03-21 – 2016-03-25 (×10): 1 via NASAL
  Filled 2016-03-21 (×3): qty 22

## 2016-03-21 MED ORDER — CEFTRIAXONE SODIUM 2 G IJ SOLR
2.0000 g | Freq: Two times a day (BID) | INTRAMUSCULAR | Status: DC
  Administered 2016-03-21 – 2016-03-31 (×19): 2 g via INTRAVENOUS
  Filled 2016-03-21 (×23): qty 2

## 2016-03-21 NOTE — Progress Notes (Signed)
Hypoglycemic Event   CBG: 64  Treatment: D50 IV 25 mL at 0400  Symptoms: None  Follow-up CBG: Time:0420 CBG Result:121  Possible Reasons for Event: Unknown  Comments/MD notified:    Evangeline GulaSarine, Laveda Demedeiros Marie

## 2016-03-21 NOTE — Plan of Care (Signed)
Problem: Pain Managment: Goal: General experience of comfort will improve Outcome: Completed/Met Date Met:  03/21/16 Pt appears comfortable  Problem: Physical Regulation: Goal: Ability to maintain clinical measurements within normal limits will improve Outcome: Completed/Met Date Met:  03/21/16 Pt maintaining normothermic, BP stable     

## 2016-03-21 NOTE — Progress Notes (Signed)
   03/21/16 1022  Clinical Encounter Type  Visited With Patient not available  Visit Type Initial  Referral From Nurse  Spiritual Encounters  Spiritual Needs Literature  Chaplain visited in response to consult ordered for advance directive. Patient listed as non-verbal in chart. Was also sleeping. So chaplain consulted nurse who expressed surprise that an advance directive had been ordered. Nurse indicated that patient is not able to communicate verbally or through writing. No family present. Chaplain left word that family should ask for return visit if they need AD.

## 2016-03-21 NOTE — Consult Note (Signed)
NEURO HOSPITALIST CONSULT NOTE   Requestig physician: Dr. Kendrick FriesMcQuaid   Reason for Consult: Meningitis   History obtained from:  Patient and Chart    HPI:                                                                                                                                          Dale [name] is an 50 y.o. male "who presents from the nursing home after being found down on the floor after falling from bed and sustaining abrasions to his forehead. On arrival to the ED he was noted to be markedly hypothermic with core temp 91.65F. 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 Course complicated by ARDS, NSTEMI, brief cardiac arrest & seizure , required trach" --neurology consulted for re-evalaution of meningitis.   Past Medical History  Diagnosis Date  . Depression   . Cardiac arrest (HCC)   . Cardiogenic shock (HCC)   . Acute respiratory failure with hypoxia (HCC)   . Meningitis, streptococcal 02/16/2016  . 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 Tobacco abuse 02/16/2016  . Substance abuse   . ETOH abuse   . Septic shock [facility](HCC)     Past Surgical History  Procedure Laterality Date  . Ankle surgery    . Cardiac catheterization N/A 02/17/2016    Procedure: IABP Insertion;  Surgeon: Iran OuchMuhammad A Arida, MD;  Location: [facility] IN[facility];  Service: Cardiovascular;  Laterality: N/A;    History reviewed. No pertinent family history.  Family History:   Social History:  reports that he has been smoking Cigarettes.  He has been smoking about 0.10 packs per day. He does not have any smokeless tobacco history on file. He reports that he drinks alcohol. He reports that he uses illicit drugs (Marijuana).  No Known Allergies  MEDICATIONS:                                                                                                                     Prior to  Admission:  Prescriptions prior to admission  Medication Sig Dispense Refill Last Dose  . acetaminophen (TYLENOL) 325 MG tablet Take 650 mg by mouth every 6 (six) hours as needed (pain/fever).  unknown  . albuterol (PROVENTIL) (2.5 MG/3ML) 0.083% nebulizer solution Take 3 mLs (2.5 mg total) by nebulization every 3 (three) hours as needed for wheezing. (Patient taking differently: Take 2.5 mg by nebulization every 3 (three) hours as needed for wheezing or shortness of breath. ) 75 mL 12 unknown  . amiodarone (PACERONE) 200 MG tablet Take 1 tablet (200 mg total) by mouth daily. 30 tablet 3 03/20/2016 at 900  . aspirin 81 MG chewable tablet Chew 1 tablet (81 mg total) by mouth daily. 30 tablet 0 03/20/2016 at 900  . carvedilol (COREG) 6.25 MG tablet Take 1 tablet (6.25 mg total) by mouth 2 (two) times daily with a meal. 60 tablet 1 03/20/2016 at 900  . folic acid (FOLVITE) 1 MG tablet Take 1 tablet (1 mg total) by mouth daily. 30 tablet 0 03/20/2016 at 900  . Nutritional Supplements (NUTRITIONAL SUPPLEMENT PO) Take 1 each by mouth 3 (three) times daily. 2 calorie milk shake   03/20/2016 at 1500  . pantoprazole (PROTONIX) 40 MG tablet Take 40 mg by mouth daily.   03/20/2016 at 600  . thiamine 100 MG tablet Take 1 tablet (100 mg total) by mouth daily. 30 tablet 0 03/20/2016 at 900  . traMADol (ULTRAM) 50 MG tablet Take 1 tablet (50 mg total) by mouth every 6 (six) hours as needed. (Patient taking differently: Take 50 mg by mouth every 6 (six) hours as needed (pain). ) 15 tablet 0 03/13/2016   Scheduled: . antiseptic oral rinse  7 mL Mouth Rinse BID  . cefTRIAXone (ROCEPHIN)  IV  2 g Intravenous Q12H  . Chlorhexidine Gluconate Cloth  6 each Topical Q0600  . enoxaparin (LOVENOX) injection  40 mg Subcutaneous Q24H  . insulin aspart  0-9 Units Subcutaneous 6 times per day  . mupirocin ointment  1 application Nasal BID  . vancomycin  500 mg Intravenous Q12H     ROS:                                                                                                                                        History obtained from unobtainable from patient due to mental status  General ROS: negative for - chills, fatigue, fever, night sweats, weight gain or weight loss Psychological ROS: negative for - behavioral disorder, hallucinations, memory difficulties, mood swings or suicidal ideation Ophthalmic ROS: negative for - blurry vision, double vision, eye pain or loss of vision ENT ROS: negative for - epistaxis, nasal discharge, oral lesions, sore throat, tinnitus or vertigo Allergy and Immunology ROS: negative for - hives or itchy/watery eyes Hematological and Lymphatic ROS: negative for - bleeding problems, bruising or swollen lymph nodes Endocrine ROS: negative for - galactorrhea, hair pattern changes, polydipsia/polyuria or temperature intolerance Respiratory ROS: negative for - cough, hemoptysis, shortness of breath or wheezing Cardiovascular ROS: negative for - chest pain, dyspnea on exertion, edema or irregular heartbeat Gastrointestinal  ROS: negative for - abdominal pain, diarrhea, hematemesis, nausea/vomiting or stool incontinence Genito-Urinary ROS: negative for - dysuria, hematuria, incontinence or urinary frequency/urgency Musculoskeletal ROS: negative for - joint swelling or muscular weakness Neurological ROS: as noted in HPI Dermatological ROS: negative for rash and skin lesion changes   Blood pressure 84/68, pulse 75, temperature 97.5 F (36.4 C), temperature source Axillary, resp. rate 16, height  (1.727 m), weight 59 kg (130 lb 1.1 oz), SpO2 100 %.   Neurologic Examination:                                                                                                      HEENT-  Normocephalic, no lesions, without obvious abnormality.  Normal external eye and conjunctiva.  Normal TM's bilaterally.  Normal auditory canals and external ears. Normal external nose, mucus membranes and  septum.  Normal pharynx. Cardiovascular- S1, S2 normal, pulses palpable throughout   Lungs- chest clear, no wheezing, rales, normal symmetric air entry Abdomen- normal findings: bowel sounds normal Extremities- no edema Lymph-no adenopathy palpable Musculoskeletal-no joint tenderness, deformity or swelling Skin-warm and dry, no hyperpigmentation, vitiligo, or suspicious lesions  Neurological Examination Mental Status: Alert but follows no commands. He will follow the light from left to right. Appears to be significantly in discomfort with moving arms and legs but no increased tone.  Cranial Nerves: II: blinks to threat bilaterally, pupils equal, round, reactive to light and accommodation III,IV, VI: ptosis not present, extra-ocular motions intact bilaterally V,VII: smile symmetric, facial light touch sensation normal bilaterally VIII: looks toward voice IX,X: uvula unable to visualize XI: bilateral shoulder shrug XII: midline tongue extension Motor: Able to move extremities but not antigravity due to significant pain Sensory: Pinprick and light touch intact throughout, bilaterally Deep Tendon Reflexes: 1+ and symmetric throughout Plantars: Mute bilaterally Cerebellar: Unable to obtain Gait: not tested      Lab Results: Basic Metabolic Panel:  Recent Labs Lab 03/20/16 1800 03/21/16 0322  NA 146* 150*  K 4.2 4.3  CL 109 120*  CO2 25 21*  GLUCOSE 87 59*  BUN 22* 24*  CREATININE 1.43* 1.39*  CALCIUM 9.3 8.0*  MG  --  1.7  PHOS  --  4.1    Liver Function Tests:  Recent Labs Lab 03/20/16 1800  AST 28  ALT 40  ALKPHOS 69  BILITOT 0.5  PROT 6.8  ALBUMIN 2.9*   No results for input(s): LIPASE, AMYLASE in the last 168 hours. No results for input(s): AMMONIA in the last 168 hours.  CBC:  Recent Labs Lab 03/20/16 1800 03/21/16 0322  WBC 5.6 6.2  NEUTROABS 3.1  --   HGB 11.5* 10.3*  HCT 35.9* 32.8*  MCV 92.8 91.9  PLT 336 307    Cardiac  Enzymes:  Recent Labs Lab 03/21/16 0322  TROPONINI <0.03    Lipid Panel: No results for input(s): CHOL, TRIG, HDL, CHOLHDL, VLDL, LDLCALC in the last 168 hours.  CBG:  Recent Labs Lab 03/21/16 0005 03/21/16 0345 03/21/16 0421 03/21/16 0839 03/21/16 1235  GLUCAP 121* 64* 121* 73 75  Microbiology: Results for orders placed or performed during the hospital encounter of 03/20/16  CSF culture     Status: None (Preliminary result)   Collection Time: 03/20/16 10:12 PM  Result Value Ref Range Status   Specimen Description CSF  Final   Special Requests TUBE 2  2CC  Final   Gram Stain   Final    CYTOSPIN SLIDE WBC PRESENT,BOTH PMN AND MONONUCLEAR NO ORGANISMS SEEN    Culture NO GROWTH < 24 HOURS  Final   Report Status PENDING  Incomplete  MRSA PCR Screening     Status: Abnormal   Collection Time: 03/20/16 11:00 PM  Result Value Ref Range Status   MRSA by PCR POSITIVE (A) NEGATIVE Final    Comment:        The GeneXpert MRSA Assay (FDA approved for NASAL specimens only), is one component of a comprehensive MRSA colonization surveillance program. It is not intended to diagnose MRSA infection nor to guide or monitor treatment for MRSA infections. RESULT CALLED TO, READ BACK BY AND VERIFIED WITH: DAVIS,E RN 0214 03/21/16 MITCHELL,L     Coagulation Studies: No results for input(s): LABPROT, INR in the last 72 hours.  Imaging: Ct Head Wo Contrast  03/20/2016  CLINICAL DATA:  Patient status post witnessed fall. Abrasion to the left periorbital soft tissues. EXAM: CT HEAD WITHOUT CONTRAST CT CERVICAL SPINE WITHOUT CONTRAST TECHNIQUE: Multidetector CT imaging of the head and cervical spine was performed following the standard protocol without intravenous contrast. Multiplanar CT image reconstructions of the cervical spine were also generated. COMPARISON:  Brain CT 02/17/2016; 02/16/2016. FINDINGS: CT HEAD FINDINGS Ventricles and sulci are appropriate for patient's age.  Interval development of left-greater-than-right basal ganglia hypodensities. No evidence for acute intracranial hemorrhage, mass lesion or mass-effect. Orbits are unremarkable. Paranasal sinuses are well aerated. Mucosal thickening left maxillary sinus. Mastoid air cells are unremarkable. Calvarium is intact. CT CERVICAL SPINE FINDINGS Normal anatomic alignment. No evidence for acute cervical spine fracture. Craniocervical junction is intact. Preservation of the vertebral body and intervertebral disc space heights. 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 Lung apices are clear. Thyroid is unremarkable. Prevertebral soft tissues unremarkable. IMPRESSION: Interval development of hypodensities within the left-greater-than-right basal ganglia which may represent subacute infarcts. No acute cervical spine fracture. Electronically Signed   By: Annia Belt M.D.   On: 03/20/2016 19:15   Ct Cervical Spine Wo Contrast  03/20/2016  CLINICAL DATA:  Patient status post witnessed fall. Abrasion to the left periorbital soft tissues. EXAM: CT HEAD WITHOUT CONTRAST CT CERVICAL SPINE WITHOUT CONTRAST TECHNIQUE: Multidetector CT imaging of the head and cervical spine was performed following the standard protocol without intravenous contrast. Multiplanar CT image reconstructions of the cervical spine were also generated. COMPARISON:  Brain CT 02/17/2016; 02/16/2016. FINDINGS: CT HEAD FINDINGS Ventricles and sulci are appropriate for patient's age. Interval development of left-greater-than-right basal ganglia hypodensities. No evidence for acute intracranial hemorrhage, mass lesion or mass-effect. Orbits are unremarkable. Paranasal sinuses are well aerated. Mucosal thickening left maxillary sinus. Mastoid air cells are unremarkable. Calvarium is intact. CT CERVICAL SPINE FINDINGS Normal anatomic alignment. No evidence for acute cervical spine fracture. Craniocervical junction is intact. Preservation of the vertebral body and  intervertebral disc space heights. 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 Lung apices are clear. Thyroid is unremarkable. Prevertebral soft tissues unremarkable. IMPRESSION: Interval development of hypodensities within the left-greater-than-right basal ganglia which may represent subacute infarcts. No acute cervical spine fracture. Electronically Signed   By: Annia Belt  M.D.   On: 03/20/2016 19:15   Dg Chest Portable 1 View  03/20/2016  CLINICAL DATA:  Recent fall with altered mental status, initial encounter EXAM: PORTABLE CHEST 1 VIEW COMPARISON:  03/05/2016 FINDINGS: The heart size and mediastinal contours are within normal limits. Both lungs are clear. The visualized skeletal structures are unremarkable. IMPRESSION: No active disease. Electronically Signed   By: Alcide Clever M.D.   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   Will be seen by Dr. Lavon Paganini. Please see his attestation note for A/P for any additional work up recommendations.

## 2016-03-21 NOTE — Progress Notes (Addendum)
PULMONARY / CRITICAL CARE MEDICINE   Name: Dale [name] MRN: [id number] DOB: 12-Jan-1966    ADMISSION DATE:  03/20/2016 CONSULTATION DATE:  03/20/16  REFERRING MD:  EDP  CHIEF COMPLAINT:  Hypothermia, fall from bed  HISTORY OF PRESENT ILLNESS:   Dale [name] is an unfortunate 31M who presents from the nursing home after being found down on the floor after falling from bed and sustaining abrasions to his forehead. On arrival to the ED he was noted to be markedly hypothermic with core temp 91.55F. 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 Course complicated by ARDS, NSTEMI, brief cardiac arrest & seizure , required trach According to nursing home staff, he is at his baseline with regards to mental status. He is unable to provide any history.  The ED physician spoke with his wife, who reports that he was "doing better" and wishes for him to be a full code this admission.   SUBJ - non verbal Warmed up Good UO No obvious pain   VITAL SIGNS: BP 94/74 mmHg  Pulse 77  Temp(Src) 97.5 F (36.4 C) (Axillary)  Resp 18  Ht  (1.727 m)  Wt 130 lb 1.1 oz (59 kg)  BMI 19.78 kg/m2  SpO2 100%  HEMODYNAMICS:     VENTILATOR SETTINGS:    INTAKE / OUTPUT: I/O last 3 completed shifts: In: 3250 [I.V.:3250] Out: 1150 [Urine:1150]  PHYSICAL EXAMINATION:  General Well nourished, well developed, thin, no apparent distress, non-verbal, responsive to noxious stimuli.  HEENT No gross abnormalities. Oropharynx not assessed 2/2 lack of patient cooperation. Bandage over tracheotomy site c/d/i.   Pulmonary Clear to auscultation bilaterally with no wheezes, rales or ronchi. Good effort, symmetrical expansion.   Cardiovascular Normal rate, regular rhythm. S1, s2. No m/r/g. Distal pulses palpable.  Abdomen Soft, non-tender, non-distended, positive bowel sounds, no palpable organomegaly or masses.  Normoresonant to percussion.  Musculoskeletal Normal bulk and tone. Moves all extremities. No bony abnormalities.   Lymphatics No cervical, supraclavicular or axillary adenopathy.   Neurologic Grossly intact. No focal deficits. Non verbal - follows 1 step commands intermittently, no neck signs  Skin/Integuement No rash, no cyanosis, no clubbing. L hand somewhat edematous. No lower extremity edema.      LABS:  BMET  Recent Labs Lab 03/20/16 1800 03/21/16 0322  NA 146* 150*  K 4.2 4.3  CL 109 120*  CO2 25 21*  BUN 22* 24*  CREATININE 1.43* 1.39*  GLUCOSE 87 59*    Electrolytes  Recent Labs Lab 03/20/16 1800 03/21/16 0322  CALCIUM 9.3 8.0*  MG  --  1.7  PHOS  --  4.1    CBC  Recent Labs Lab 03/20/16 1800 03/21/16 0322  WBC 5.6 6.2  HGB 11.5* 10.3*  HCT 35.9* 32.8*  PLT 336 307    Coag's No results for input(s): APTT, INR in the last 168 hours.  Sepsis Markers  Recent Labs Lab 03/20/16 1824 03/20/16 2044  LATICACIDVEN 1.38 0.47*    ABG No results for input(s): PHART, PCO2ART, PO2ART in the last 168 hours.  Liver Enzymes  Recent Labs Lab 03/20/16 1800  AST 28  ALT 40  ALKPHOS 69  BILITOT 0.5  ALBUMIN 2.9*    Cardiac Enzymes  Recent Labs Lab 03/21/16 0322  TROPONINI <0.03    Glucose  Recent Labs Lab 03/20/16 1905 03/20/16 2304 03/21/16 0005 03/21/16 0345 03/21/16 0421 03/21/16 0839  GLUCAP 81 65 121*  64* 121* 73    Imaging Ct Head Wo Contrast  03/20/2016  CLINICAL DATA:  Patient status post witnessed fall. Abrasion to the left periorbital soft tissues. EXAM: CT HEAD WITHOUT CONTRAST CT CERVICAL SPINE WITHOUT CONTRAST TECHNIQUE: Multidetector CT imaging of the head and cervical spine was performed following the standard protocol without intravenous contrast. Multiplanar CT image reconstructions of the cervical spine were also generated. COMPARISON:  Brain CT 02/17/2016; 02/16/2016. FINDINGS: CT HEAD FINDINGS Ventricles and  sulci are appropriate for patient's age. Interval development of left-greater-than-right basal ganglia hypodensities. No evidence for acute intracranial hemorrhage, mass lesion or mass-effect. Orbits are unremarkable. Paranasal sinuses are well aerated. Mucosal thickening left maxillary sinus. Mastoid air cells are unremarkable. Calvarium is intact. CT CERVICAL SPINE FINDINGS Normal anatomic alignment. No evidence for acute cervical spine fracture. Craniocervical junction is intact. Preservation of the vertebral body and intervertebral disc space heights. 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 Lung apices are clear. Thyroid is unremarkable. Prevertebral soft tissues unremarkable. IMPRESSION: Interval development of hypodensities within the left-greater-than-right basal ganglia which may represent subacute infarcts. No acute cervical spine fracture. Electronically Signed   By: Annia Belt M.D.   On: 03/20/2016 19:15   Ct Cervical Spine Wo Contrast  03/20/2016  CLINICAL DATA:  Patient status post witnessed fall. Abrasion to the left periorbital soft tissues. EXAM: CT HEAD WITHOUT CONTRAST CT CERVICAL SPINE WITHOUT CONTRAST TECHNIQUE: Multidetector CT imaging of the head and cervical spine was performed following the standard protocol without intravenous contrast. Multiplanar CT image reconstructions of the cervical spine were also generated. COMPARISON:  Brain CT 02/17/2016; 02/16/2016. FINDINGS: CT HEAD FINDINGS Ventricles and sulci are appropriate for patient's age. Interval development of left-greater-than-right basal ganglia hypodensities. No evidence for acute intracranial hemorrhage, mass lesion or mass-effect. Orbits are unremarkable. Paranasal sinuses are well aerated. Mucosal thickening left maxillary sinus. Mastoid air cells are unremarkable. Calvarium is intact. CT CERVICAL SPINE FINDINGS Normal anatomic alignment. No evidence for acute cervical spine fracture. Craniocervical junction is intact.  Preservation of the vertebral body and intervertebral disc space heights. 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 Lung apices are clear. Thyroid is unremarkable. Prevertebral soft tissues unremarkable. IMPRESSION: Interval development of hypodensities within the left-greater-than-right basal ganglia which may represent subacute infarcts. No acute cervical spine fracture. Electronically Signed   By: Annia Belt M.D.   On: 03/20/2016 19:15   Dg Chest Portable 1 View  03/20/2016  CLINICAL DATA:  Recent fall with altered mental status, initial encounter EXAM: PORTABLE CHEST 1 VIEW COMPARISON:  03/05/2016 FINDINGS: The heart size and mediastinal contours are within normal limits. Both lungs are clear. The visualized skeletal structures are unremarkable. IMPRESSION: No active disease. Electronically Signed   By: Alcide Clever M.D.   On: 03/20/2016 18:16    STUDIES:  Head CT 4/16 ?? Sub acute BG infarcts  CULTURES: CSF 4/16 Blood 4/16  ANTIBIOTICS: Vancomycin Rocephin  SIGNIFICANT EVENTS:   LINES/TUBES: PIV  DISCUSSION: Mr. Borntreger is an unfortunate gentleman who was found down and hypothermic. His baseline mental status and function is poor, but reportedly unchanged. His blood pressures have been borderline and may worsen as he re-warms. Sepsis remains on the differential, although he has a normal white count and differential. Blood and CSF cultures are pending and he will be empirically covered for possible recurrent meningitis. TSH was adequate.  ASSESSMENT / PLAN:  PULMONARY A: No acute issues P:   Monitor  CARDIOVASCULAR A:  Borderline blood pressures  -improved with  fluids EF recovered after last VT arrest P:  Fluid resuscitate Maintain MAP >65 Resume amio/ coreg when able to take PO   RENAL A:  AKI  Hypernatremia P: Hydrate - use D51/2 NS  GASTROINTESTINAL A:   No acute issues P:   NPO for now  HEMATOLOGIC A:   No acute issues P:  Trend  CBC  INFECTIOUS A:   Severe sepsis, concern for recurrent meningitis CSF has high protein/ neutrophils P:   Empiric coverage with vanc/rocephin Await Blood, CSF cultures Do we have to check for sinus source?  ENDOCRINE A:   Hypothermia P:   Warm with IVF, warming blankets  NEUROLOGIC A:   Acute encephalopathy Sub acute CVA P:   RASS goal: 0 Haldol prn agitation Neuro consult   FAMILY  - Updates: No family at bedside  - Inter-disciplinary family meet or Palliative Care meeting due by:  day 7  Can transfer to SDU  The patient is critically ill with multiple organ system failure and requires high complexity decision making for assessment and support, frequent evaluation and titration of therapies, advanced monitoring, review of radiographic studies and interpretation of complex data.   Critical Care Time devoted to patient care services, exclusive of separately billable procedures, described in this note is 32  minutes.    Cyril Mourningakesh Jowanda Heeg MD. Tonny BollmanFCCP. [facility] Pager [phone] If no response call [phone]   03/21/2016   03/21/2016, 12:11 PM

## 2016-03-21 NOTE — Progress Notes (Signed)
Pharmacy Antibiotic Note  Dale [name] is a 50 y.o. male admitted on 03/20/2016 with sepsis/rule out meningitis. Pharmacy has been consulted for Ceftriaxone dosing (already on Vancomycin). Recently discharged after admission for streptococcal bacteremia/meningitis.   Plan: -Ceftriaxone 2g IV q12h -Already on vancomycin  -F/U infectious work-up  Height: 5\' 8"  (172.7 cm) Weight: 130 lb 1.1 oz (59 kg) IBW/kg (Calculated) : 68.4  Temp (24hrs), Avg:93.5 F (34.2 C), Min:91 F (32.8 C), Max:96.1 F (35.6 C)   Recent Labs Lab 03/20/16 1800 03/20/16 1824 03/20/16 2044  WBC 5.6  --   --   CREATININE 1.43*  --   --   LATICACIDVEN  --  1.38 0.47*    Estimated Creatinine Clearance: 51 mL/min (by C-G formula based on Cr of 1.43).    No Known Allergies \ Abran DukeLedford, Natacha Jepsen 03/21/2016 12:44 AM

## 2016-03-21 NOTE — Progress Notes (Signed)
Update given to MD regarding spinal fluid in lab

## 2016-03-21 NOTE — Progress Notes (Signed)
MD made aware of FSBS, orders received

## 2016-03-22 ENCOUNTER — Inpatient Hospital Stay (HOSPITAL_COMMUNITY): Payer: Medicaid Other

## 2016-03-22 DIAGNOSIS — G001 Pneumococcal meningitis: Secondary | ICD-10-CM

## 2016-03-22 DIAGNOSIS — A419 Sepsis, unspecified organism: Secondary | ICD-10-CM | POA: Insufficient documentation

## 2016-03-22 DIAGNOSIS — N179 Acute kidney failure, unspecified: Secondary | ICD-10-CM

## 2016-03-22 DIAGNOSIS — R7989 Other specified abnormal findings of blood chemistry: Secondary | ICD-10-CM

## 2016-03-22 DIAGNOSIS — E870 Hyperosmolality and hypernatremia: Secondary | ICD-10-CM

## 2016-03-22 DIAGNOSIS — I340 Nonrheumatic mitral (valve) insufficiency: Secondary | ICD-10-CM

## 2016-03-22 DIAGNOSIS — R6520 Severe sepsis without septic shock: Secondary | ICD-10-CM | POA: Insufficient documentation

## 2016-03-22 DIAGNOSIS — I5042 Chronic combined systolic (congestive) and diastolic (congestive) heart failure: Secondary | ICD-10-CM

## 2016-03-22 DIAGNOSIS — T68XXXA Hypothermia, initial encounter: Secondary | ICD-10-CM

## 2016-03-22 LAB — BASIC METABOLIC PANEL
Anion gap: 10 (ref 5–15)
BUN: 16 mg/dL (ref 6–20)
CALCIUM: 8.7 mg/dL — AB (ref 8.9–10.3)
CO2: 23 mmol/L (ref 22–32)
CREATININE: 1.67 mg/dL — AB (ref 0.61–1.24)
Chloride: 117 mmol/L — ABNORMAL HIGH (ref 101–111)
GFR calc Af Amer: 53 mL/min — ABNORMAL LOW (ref >60)
GFR calc non Af Amer: 46 mL/min — ABNORMAL LOW (ref >60)
GLUCOSE: 81 mg/dL (ref 65–99)
Potassium: 4.2 mmol/L (ref 3.5–5.1)
Sodium: 150 mmol/L — ABNORMAL HIGH (ref 135–145)

## 2016-03-22 LAB — GLUCOSE, CAPILLARY
GLUCOSE-CAPILLARY: 122 mg/dL — AB (ref 65–99)
GLUCOSE-CAPILLARY: 128 mg/dL — AB (ref 65–99)
GLUCOSE-CAPILLARY: 69 mg/dL (ref 65–99)
Glucose-Capillary: 73 mg/dL (ref 65–99)
Glucose-Capillary: 76 mg/dL (ref 65–99)
Glucose-Capillary: 90 mg/dL (ref 65–99)

## 2016-03-22 LAB — CBC
HCT: 32.2 % — ABNORMAL LOW (ref 39.0–52.0)
Hemoglobin: 10.7 g/dL — ABNORMAL LOW (ref 13.0–17.0)
MCH: 30.6 pg (ref 26.0–34.0)
MCHC: 33.2 g/dL (ref 30.0–36.0)
MCV: 92 fL (ref 78.0–100.0)
PLATELETS: 243 10*3/uL (ref 150–400)
RBC: 3.5 MIL/uL — ABNORMAL LOW (ref 4.22–5.81)
RDW: 15.1 % (ref 11.5–15.5)
WBC: 5.3 10*3/uL (ref 4.0–10.5)

## 2016-03-22 LAB — ECHOCARDIOGRAM COMPLETE
Height: 68 in
Weight: 2091.72 oz

## 2016-03-22 MED ORDER — SODIUM CHLORIDE 0.9 % IV SOLN
500.0000 mg | Freq: Two times a day (BID) | INTRAVENOUS | Status: DC
  Administered 2016-03-22 – 2016-03-26 (×9): 500 mg via INTRAVENOUS
  Filled 2016-03-22 (×10): qty 5

## 2016-03-22 MED ORDER — CHLORHEXIDINE GLUCONATE 0.12 % MT SOLN
15.0000 mL | Freq: Two times a day (BID) | OROMUCOSAL | Status: DC
  Administered 2016-03-22 – 2016-03-31 (×18): 15 mL via OROMUCOSAL
  Filled 2016-03-22 (×18): qty 15

## 2016-03-22 MED ORDER — CETYLPYRIDINIUM CHLORIDE 0.05 % MT LIQD
7.0000 mL | Freq: Two times a day (BID) | OROMUCOSAL | Status: DC
  Administered 2016-03-22 – 2016-03-30 (×16): 7 mL via OROMUCOSAL

## 2016-03-22 MED ORDER — METHYLPREDNISOLONE SODIUM SUCC 125 MG IJ SOLR
125.0000 mg | Freq: Three times a day (TID) | INTRAMUSCULAR | Status: DC
  Administered 2016-03-22 – 2016-03-25 (×10): 125 mg via INTRAVENOUS
  Filled 2016-03-22 (×10): qty 2

## 2016-03-22 MED ORDER — IOPAMIDOL (ISOVUE-370) INJECTION 76%
INTRAVENOUS | Status: AC
  Administered 2016-03-22: 50 mL via INTRAVENOUS
  Filled 2016-03-22: qty 50

## 2016-03-22 MED ORDER — FAMOTIDINE IN NACL 20-0.9 MG/50ML-% IV SOLN
20.0000 mg | INTRAVENOUS | Status: DC
  Administered 2016-03-22 – 2016-03-24 (×3): 20 mg via INTRAVENOUS
  Filled 2016-03-22 (×3): qty 50

## 2016-03-22 NOTE — Progress Notes (Signed)
PROGRESS NOTE    Dale [name]  ZOX:[id number] DOB: 1966-06-05 DOA: 03/20/2016 PCP: No primary care provider on file.   Outpatient Specialists:     Brief Narrative:  Dale [name] is an unfortunate 68 BM PMHx Depression; Cardiac arrest; Cardiogenic shock ; Acute respiratory failure with hypoxia ; Meningitis, streptococcal (02/16/2016); Trichomonal urethritis in male (02/16/2016); Tobacco abuse ; Substance abuse; ETOH abuse;   Presents from the nursing home after being found down on the floor after falling from bed and sustaining abrasions to his forehead. On arrival to the ED he was noted to be markedly hypothermic with core temp 91.42F. Reportedly at baseline he is non-verbal and full assist since being discharged after a long hospitalization for pneumococcal meningitis. He was discharged 03/10/16.  According to nursing home staff, he is at his baseline with regards to mental status. He is unable to provide any history.  The ED physician spoke with his wife, who reports that he was "doing better" and wishes for him to be a full code this admission.   Assessment & Plan:   Active Problems:   Hypothermia   Severe sepsis, Recurrent meningitis?/CSF has high protein/ neutrophils -Empiric coverage with vanc/rocephin -Await Blood, CSF cultures -will await infectious disease recommendations  Acute encephalopathy/Sub acute CVA -slightly multifactorial to include severe sepsis, subacute infarct, brain abscess? -Treat underlying cause -q 4hr Neuro checks  Chronic Systolic and Diastolic -all BP medication held secondary to hypotension -LVEF slightly improved over last month S/P VT arrest -D5-0.45% saline 59ml/hr  -Maintain MAP >65 -resume BP medication as indicated.  -Strict in and out  -Daily weight  Filed Weights   03/20/16 2250 03/21/16 0404 03/22/16 0500  Weight: 59 kg (130 lb 1.1 oz) 59 kg (130 lb 1.1 oz) 59.3 kg (130 lb 11.7 oz)   Acute renal failure unspecified  --most likely  multifactorial to include sepsis, heart failure, dehydration. -See sepsis  Hypernatremia -use D51/2 NS  Hypothermia -when temp < 37 C use bear hugger  Malnutrition -Place CoreTrack tube   DVT prophylaxis: Lovenox Code Status: full Family Communication: no family present Disposition Plan: Resolution acute encephalopathy, infection   Consultants:  Dr.Ram Fabiola Backer [place] neuro hospitalist Dr.  Procedures/Significant Events:  4/18 echocardiogram;- LVEF= 45% to 50%. Diffuse hypokinesis.- .(grade 1 diastolic dysfunction). -Pericardium, extracardiac: A small pericardial effusion    Cultures 4/16 CSF  4/16 Blood right arm/hand NGTD 4/16 CSF NGTD 4/16 MRSA by PCR positive  Antimicrobials:  Vancomycin 4/16>> Rocephin 4/16>>    Devices NA   LINES / TUBES:  NA    Continuous Infusions: . dextrose 5 % and 0.45% NaCl 75 mL/hr at 03/22/16 0300     Subjective: 4/18 alert, does not once painful stimuli or commands  Objective: Filed Vitals:   03/22/16 0319 03/22/16 0320 03/22/16 0500 03/22/16 0709  BP:  85/67  90/68  Pulse:  75  74  Temp: 97.3 F (36.3 C)     TempSrc: Axillary     Resp:  20  17  Height:      Weight:   59.3 kg (130 lb 11.7 oz)   SpO2:  100%  100%    Intake/Output Summary (Last 24 hours) at 03/22/16 0813 Last data filed at 03/22/16 0700  Gross per 24 hour  Intake 1667.5 ml  Output    210 ml  Net 1457.5 ml   Filed Weights   03/20/16 2250 03/21/16 0404 03/22/16 0500  Weight: 59 kg (130 lb 1.1 oz) 59 kg (130 lb  1.1 oz) 59.3 kg (130 lb 11.7 oz)    Examination:  General: A/O x 0, eyes open unresponsive,No acute respiratory distress Eyes: negative scleral hemorrhage, negative anisocoria, negative icterus ENT: Negative Runny nose, negative gingival bleeding, Neck:  Positive tracheostomy scars, masses, torticollis, lymphadenopathy, JVD Lungs: Clear to auscultation bilaterally without wheezes or crackles Cardiovascular: Regular  rate and rhythm without murmur gallop or rub normal S1 and S2 Abdomen: negative abdominal pain, nondistended, positive soft, bowel sounds, no rebound, no ascites, no appreciable mass Extremities: No significant cyanosis, clubbing, or edema bilateral lower extremities Skin: Negative rashes, lesions, ulcers Psychiatric:  Unable to evaluate secondary to altered mental status  Central nervous system:  Eyes open alert, unable to evaluate secondary to altered mental status .     Data Reviewed: Care during the described time interval was provided by me .  I have reviewed this patient's available data, including medical history, events of note, physical examination, and all test results as part of my evaluation. I have personally reviewed and interpreted all radiology studies.  CBC:  Recent Labs Lab 03/20/16 1800 03/21/16 0322 03/22/16 0443  WBC 5.6 6.2 5.3  NEUTROABS 3.1  --   --   HGB 11.5* 10.3* 10.7*  HCT 35.9* 32.8* 32.2*  MCV 92.8 91.9 92.0  PLT 336 307 243   Basic Metabolic Panel:  Recent Labs Lab 03/20/16 1800 03/21/16 0322 03/22/16 0443  NA 146* 150* 150*  K 4.2 4.3 4.2  CL 109 120* 117*  CO2 25 21* 23  GLUCOSE 87 59* 81  BUN 22* 24* 16  CREATININE 1.43* 1.39* 1.67*  CALCIUM 9.3 8.0* 8.7*  MG  --  1.7  --   PHOS  --  4.1  --    GFR: Estimated Creatinine Clearance: 43.9 mL/min (by C-G formula based on Cr of 1.67). Liver Function Tests:  Recent Labs Lab 03/20/16 1800  AST 28  ALT 40  ALKPHOS 69  BILITOT 0.5  PROT 6.8  ALBUMIN 2.9*   No results for input(s): LIPASE, AMYLASE in the last 168 hours. No results for input(s): AMMONIA in the last 168 hours. Coagulation Profile: No results for input(s): INR, PROTIME in the last 168 hours. Cardiac Enzymes:  Recent Labs Lab 03/21/16 0322 03/21/16 1240  TROPONINI <0.03 <0.03   BNP (last 3 results) No results for input(s): PROBNP in the last 8760 hours. HbA1C: No results for input(s): HGBA1C in the last 72  hours. CBG:  Recent Labs Lab 03/21/16 1600 03/21/16 1934 03/22/16 0024 03/22/16 0322 03/22/16 0710  GLUCAP 73 73 69 73 76   Lipid Profile: No results for input(s): CHOL, HDL, LDLCALC, TRIG, CHOLHDL, LDLDIRECT in the last 72 hours. Thyroid Function Tests:  Recent Labs  03/20/16 2000  TSH 2.712  FREET4 0.96   Anemia Panel: No results for input(s): VITAMINB12, FOLATE, FERRITIN, TIBC, IRON, RETICCTPCT in the last 72 hours. Urine analysis:    Component Value Date/Time   COLORURINE YELLOW 03/20/2016 1910   APPEARANCEUR CLEAR 03/20/2016 1910   LABSPEC 1.017 03/20/2016 1910   PHURINE 5.0 03/20/2016 1910   GLUCOSEU NEGATIVE 03/20/2016 1910   HGBUR NEGATIVE 03/20/2016 1910   BILIRUBINUR NEGATIVE 03/20/2016 1910   KETONESUR NEGATIVE 03/20/2016 1910   PROTEINUR NEGATIVE 03/20/2016 1910   UROBILINOGEN 1.0 11/13/2011 2010   NITRITE NEGATIVE 03/20/2016 1910   LEUKOCYTESUR TRACE* 03/20/2016 1910   Sepsis Labs: @LABRCNTIP (procalcitonin:4,lacticidven:4)  ) Recent Results (from the past 240 hour(s))  Blood culture (routine x 2)     Status:  None (Preliminary result)   Collection Time: 03/20/16  6:00 PM  Result Value Ref Range Status   Specimen Description BLOOD RIGHT ARM  Final   Special Requests BOTTLES DRAWN AEROBIC AND ANAEROBIC 5CC  Final   Culture NO GROWTH < 24 HOURS  Final   Report Status PENDING  Incomplete  Blood culture (routine x 2)     Status: None (Preliminary result)   Collection Time: 03/20/16  6:10 PM  Result Value Ref Range Status   Specimen Description BLOOD RIGHT HAND  Final   Special Requests BOTTLES DRAWN AEROBIC AND ANAEROBIC 5CC  Final   Culture NO GROWTH < 24 HOURS  Final   Report Status PENDING  Incomplete  CSF culture     Status: None (Preliminary result)   Collection Time: 03/20/16 10:12 PM  Result Value Ref Range Status   Specimen Description CSF  Final   Special Requests TUBE 2  2CC  Final   Gram Stain   Final    CYTOSPIN SLIDE WBC  PRESENT,BOTH PMN AND MONONUCLEAR NO ORGANISMS SEEN    Culture NO GROWTH < 24 HOURS  Final   Report Status PENDING  Incomplete  MRSA PCR Screening     Status: Abnormal   Collection Time: 03/20/16 11:00 PM  Result Value Ref Range Status   MRSA by PCR POSITIVE (A) NEGATIVE Final    Comment:        The GeneXpert MRSA Assay (FDA approved for NASAL specimens only), is one component of a comprehensive MRSA colonization surveillance program. It is not intended to diagnose MRSA infection nor to guide or monitor treatment for MRSA infections. RESULT CALLED TO, READ BACK BY AND VERIFIED WITH: DAVIS,E RN [phone] 03/21/16 MITCHELL,L          Radiology Studies: Ct Head Wo Contrast  03/20/2016  CLINICAL DATA:  Patient status post witnessed fall. Abrasion to the left periorbital soft tissues. EXAM: CT HEAD WITHOUT CONTRAST CT CERVICAL SPINE WITHOUT CONTRAST TECHNIQUE: Multidetector CT imaging of the head and cervical spine was performed following the standard protocol without intravenous contrast. Multiplanar CT image reconstructions of the cervical spine were also generated. COMPARISON:  Brain CT 02/17/2016; 02/16/2016. FINDINGS: CT HEAD FINDINGS Ventricles and sulci are appropriate for patient's age. Interval development of left-greater-than-right basal ganglia hypodensities. No evidence for acute intracranial hemorrhage, mass lesion or mass-effect. Orbits are unremarkable. Paranasal sinuses are well aerated. Mucosal thickening left maxillary sinus. Mastoid air cells are unremarkable. Calvarium is intact. CT CERVICAL SPINE FINDINGS Normal anatomic alignment. No evidence for acute cervical spine fracture. Craniocervical junction is intact. Preservation of the vertebral body and intervertebral disc space heights. 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 Lung apices are clear. Thyroid is unremarkable. Prevertebral soft tissues unremarkable. IMPRESSION: Interval development of hypodensities within the  left-greater-than-right basal ganglia which may represent subacute infarcts. No acute cervical spine fracture. Electronically Signed   By: Annia Belt M.D.   On: 03/20/2016 19:15   Ct Cervical Spine Wo Contrast  03/20/2016  CLINICAL DATA:  Patient status post witnessed fall. Abrasion to the left periorbital soft tissues. EXAM: CT HEAD WITHOUT CONTRAST CT CERVICAL SPINE WITHOUT CONTRAST TECHNIQUE: Multidetector CT imaging of the head and cervical spine was performed following the standard protocol without intravenous contrast. Multiplanar CT image reconstructions of the cervical spine were also generated. COMPARISON:  Brain CT 02/17/2016; 02/16/2016. FINDINGS: CT HEAD FINDINGS Ventricles and sulci are appropriate for patient's age. Interval development of left-greater-than-right basal ganglia hypodensities. No evidence for acute intracranial  hemorrhage, mass lesion or mass-effect. Orbits are unremarkable. Paranasal sinuses are well aerated. Mucosal thickening left maxillary sinus. Mastoid air cells are unremarkable. Calvarium is intact. CT CERVICAL SPINE FINDINGS Normal anatomic alignment. No evidence for acute cervical spine fracture. Craniocervical junction is intact. Preservation of the vertebral body and intervertebral disc space heights. 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 Lung apices are clear. Thyroid is unremarkable. Prevertebral soft tissues unremarkable. IMPRESSION: Interval development of hypodensities within the left-greater-than-right basal ganglia which may represent subacute infarcts. No acute cervical spine fracture. Electronically Signed   By: Annia Belt M.D.   On: 03/20/2016 19:15   Mr Laqueta Jean ZO Contrast  03/21/2016  CLINICAL DATA:  Found on floor. 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 EXAM: MRI HEAD WITHOUT AND WITH CONTRAST TECHNIQUE: Multiplanar, multiecho pulse sequences of the brain and surrounding structures were obtained without and with intravenous contrast.  CONTRAST:  13mL MULTIHANCE GADOBENATE DIMEGLUMINE 529 MG/ML IV SOLN COMPARISON:  CT head 03/20/2016 FINDINGS: Image quality degraded by motion. Postcontrast images have significant motion. Edema in the basal ganglia bilaterally left greater than right as noted on CT. There is enhancement in the basal ganglia bilaterally. There is abnormal enhancement in the external capsule on the left, rim enhancing enhancement in the left internal capsule and medial basal ganglia. Abnormal enhancement in the left caudate. There is enhancement in the right caudate and right putamen. Many of these areas are within vascular distribution suggesting ischemia. Small area of enhancement in the right parietal cortex could also be an area of subacute infarct. Diffusion-weighted imaging shows restricted diffusion in the left putamen and caudate and possibly in the external capsule on the left. These are most likely areas of subacute infarct. Restricted diffusion also noted in the right parietal cortex where there is abnormal enhancement. Right basal ganglia does not show significant restricted diffusion but does show T2 shine through. Ventricle size normal. No shift of the midline structures. Negative for intracranial hemorrhage. IMPRESSION: Image quality degraded by significant motion. There is extensive abnormal enhancement in the basal ganglia bilaterally. Some of these areas show restricted diffusion left basal ganglia. The enhancement pattern and vascular territory suggest the enhancement is related to subacute infarct related to meningitis 1 month ago. Similar area of enhancement and restricted diffusion in the right parietal cortex. No intracranial hemorrhage. Electronically Signed   By: Marlan Palau M.D.   On: 03/21/2016 19:06   Dg Chest Portable 1 View  03/20/2016  CLINICAL DATA:  Recent fall with altered mental status, initial encounter EXAM: PORTABLE CHEST 1 VIEW COMPARISON:  03/05/2016 FINDINGS: The heart size and  mediastinal contours are within normal limits. Both lungs are clear. The visualized skeletal structures are unremarkable. IMPRESSION: No active disease. Electronically Signed   By: Alcide Clever M.D.   On: 03/20/2016 18:16        Scheduled Meds: . antiseptic oral rinse  7 mL Mouth Rinse BID  . cefTRIAXone (ROCEPHIN)  IV  2 g Intravenous Q12H  . Chlorhexidine Gluconate Cloth  6 each Topical Q0600  . enoxaparin (LOVENOX) injection  40 mg Subcutaneous Q24H  . famotidine (PEPCID) IV  20 mg Intravenous Q24H  . insulin aspart  0-9 Units Subcutaneous 6 times per day  . levETIRAcetam  500 mg Intravenous Q12H  . methylPREDNISolone (SOLU-MEDROL) injection  125 mg Intravenous 3 times per day  . mupirocin ointment  1 application Nasal BID  . vancomycin  500 mg Intravenous Q12H   Continuous Infusions: . dextrose 5 %  and 0.45% NaCl 75 mL/hr at 03/22/16 0300     LOS: 2 days    Time spent: 40 minutes    WOODS, Roselind Messier, MD [facility] Pager [phone]   If 7PM-7AM, please contact night-coverage [email or web address] Password [facility] 03/22/2016, 8:13 AM

## 2016-03-22 NOTE — Progress Notes (Signed)
  Echocardiogram 2D Echocardiogram has been performed.  Dale [name] 03/22/2016, 10:13 AM

## 2016-03-22 NOTE — Progress Notes (Signed)
Interval History:                                                                                                                      Dale [name] is an 50 y.o. male patient with STREPTOCOCCUS PNEUMONIAE meningoencephalitis, possibly representing a combination of septic arterial cerebral emboli showing as subacute infarcts, possible vasculitis, and possible parenchymal infection with microabscesses and inflammatory changes. CTA and TEE pending. Formal echo pending. Keppra and Solumedrol have been started.    Past Medical History: Past Medical History  Diagnosis Date  . Depression   . Cardiac arrest (HCC)   . Cardiogenic shock (HCC)   . Acute respiratory failure with hypoxia (HCC)   . Meningitis, streptococcal 02/16/2016  . 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 Tobacco abuse 02/16/2016  . Substance abuse   . ETOH abuse   . Septic shock [facility])     Past Surgical History  Procedure Laterality Date  . Ankle surgery    . Cardiac catheterization N/A 02/17/2016    Procedure: IABP Insertion;  Surgeon: Iran Ouch, MD;  Location: [facility] INVASIVE CV LAB;  Service: Cardiovascular;  Laterality: N/A;    Family History: History reviewed. No pertinent family history.  Social History:   reports that he has been smoking Cigarettes.  He has been smoking about 0.10 packs per day. He does not have any smokeless tobacco history on file. He reports that he drinks alcohol. He reports that he uses illicit drugs (Marijuana).  Allergies:  No Known Allergies   Medications:                                                                                                                         Current facility-administered medications:  .  0.9 %  sodium chloride infusion, 250 mL, Intravenous, PRN, Orville Govern, MD .  antiseptic oral rinse (CPC / CETYLPYRIDINIUM CHLORIDE 0.05%) solution 7 mL, 7 mL, Mouth Rinse, BID, Praveen Mannam, MD, 7 mL at 03/21/16 2200 .  cefTRIAXone (ROCEPHIN) 2 g in  dextrose 5 % 50 mL IVPB, 2 g, Intravenous, Q12H, Stevphen Rochester, RPH, 2 g at 03/21/16 2145 .  Chlorhexidine Gluconate Cloth 2 % PADS 6 each, 6 each, Topical, Q0600, Chilton Greathouse, MD, 6 each at 03/22/16 1000 .  dextrose 5 %-0.45 % sodium chloride infusion, , Intravenous, Continuous, Oretha Milch, MD, Stopped at 03/22/16 1016 .  enoxaparin (LOVENOX) injection 40 mg, 40 mg, Subcutaneous, Q24H, Orville Govern,  MD, 40 mg at 03/21/16 2153 .  famotidine (PEPCID) IVPB 20 mg premix, 20 mg, Intravenous, Q24H, Ram Daniel Nones, MD, 20 mg at 03/22/16 0857 .  insulin aspart (novoLOG) injection 0-9 Units, 0-9 Units, Subcutaneous, 6 times per day, Chilton Greathouse, MD, 0 Units at 03/21/16 0030 .  iopamidol (ISOVUE-370) 76 % injection, , , ,  .  levETIRAcetam (KEPPRA) 500 mg in sodium chloride 0.9 % 100 mL IVPB, 500 mg, Intravenous, Q12H, Ram Daniel Nones, MD, 500 mg at 03/22/16 0839 .  methylPREDNISolone sodium succinate (SOLU-MEDROL) 125 mg/2 mL injection 125 mg, 125 mg, Intravenous, 3 times per day, Ram Daniel Nones, MD, 125 mg at 03/22/16 [phone] .  mupirocin ointment (BACTROBAN) 2 % 1 application, 1 application, Nasal, BID, Chilton Greathouse, MD, 1 application at 03/22/16 0856 .  vancomycin (VANCOCIN) 500 mg in sodium chloride 0.9 % 100 mL IVPB, 500 mg, Intravenous, Q12H, Alvira Monday, MD, 500 mg at 03/21/16 2200   Neurologic Examination:                                                                                                     Today's Vitals   03/22/16 0319 03/22/16 0320 03/22/16 0500 03/22/16 0709  BP:  85/67  90/68  Pulse:  75  74  Temp: 97.3 F (36.3 C)   98.3 F (36.8 C)  TempSrc: Axillary   Axillary  Resp:  20  17  Height:      Weight:   59.3 kg (130 lb 11.7 oz)   SpO2:  100%  100%    Mental Status: Alert but follows no commands. He will follow the light from left to right. Cranial Nerves: II: blinks to threat bilaterally, pupils equal, round,  reactive to light and accommodation III,IV, VI: ptosis not present, extra-ocular motions intact bilaterally V,VII: smile symmetric, facial light touch sensation normal bilaterally VIII: looks toward voice IX,X: uvula unable to visualize XI: bilateral shoulder shrug XII: midline tongue extension Motor: Able to move extremities but not antigravity due to significant pain Sensory: Pinprick and light touch intact throughout, bilaterally Deep Tendon Reflexes: 1+ and symmetric throughout Plantars: Mute bilaterally Cerebellar: Unable to obtain Gait: not tested    Lab Results: Basic Metabolic Panel:  Recent Labs Lab 03/20/16 1800 03/21/16 0322 03/22/16 0443  NA 146* 150* 150*  K 4.2 4.3 4.2  CL 109 120* 117*  CO2 25 21* 23  GLUCOSE 87 59* 81  BUN 22* 24* 16  CREATININE 1.43* 1.39* 1.67*  CALCIUM 9.3 8.0* 8.7*  MG  --  1.7  --   PHOS  --  4.1  --     Liver Function Tests:  Recent Labs Lab 03/20/16 1800  AST 28  ALT 40  ALKPHOS 69  BILITOT 0.5  PROT 6.8  ALBUMIN 2.9*   No results for input(s): LIPASE, AMYLASE in the last 168 hours. No results for input(s): AMMONIA in the last 168 hours.  CBC:  Recent Labs Lab 03/20/16 1800 03/21/16 0322 03/22/16 0443  WBC 5.6 6.2 5.3  NEUTROABS 3.1  --   --  HGB 11.5* 10.3* 10.7*  HCT 35.9* 32.8* 32.2*  MCV 92.8 91.9 92.0  PLT 336 307 243    Cardiac Enzymes:  Recent Labs Lab 03/21/16 0322 03/21/16 1240  TROPONINI <0.03 <0.03    Lipid Panel: No results for input(s): CHOL, TRIG, HDL, CHOLHDL, VLDL, LDLCALC in the last 168 hours.  CBG:  Recent Labs Lab 03/21/16 1600 03/21/16 1934 03/22/16 0024 03/22/16 0322 03/22/16 0710  GLUCAP 73 73 69 73 76    Microbiology: Results for orders placed or performed during the hospital encounter of 03/20/16  Blood culture (routine x 2)     Status: None (Preliminary result)   Collection Time: 03/20/16  6:00 PM  Result Value Ref Range Status   Specimen Description  BLOOD RIGHT ARM  Final   Special Requests BOTTLES DRAWN AEROBIC AND ANAEROBIC 5CC  Final   Culture NO GROWTH < 24 HOURS  Final   Report Status PENDING  Incomplete  Blood culture (routine x 2)     Status: None (Preliminary result)   Collection Time: 03/20/16  6:10 PM  Result Value Ref Range Status   Specimen Description BLOOD RIGHT HAND  Final   Special Requests BOTTLES DRAWN AEROBIC AND ANAEROBIC 5CC  Final   Culture NO GROWTH < 24 HOURS  Final   Report Status PENDING  Incomplete  CSF culture     Status: None (Preliminary result)   Collection Time: 03/20/16 10:12 PM  Result Value Ref Range Status   Specimen Description CSF  Final   Special Requests TUBE 2  2CC  Final   Gram Stain   Final    CYTOSPIN SLIDE WBC PRESENT,BOTH PMN AND MONONUCLEAR NO ORGANISMS SEEN    Culture NO GROWTH < 24 HOURS  Final   Report Status PENDING  Incomplete  MRSA PCR Screening     Status: Abnormal   Collection Time: 03/20/16 11:00 PM  Result Value Ref Range Status   MRSA by PCR POSITIVE (A) NEGATIVE Final    Comment:        The GeneXpert MRSA Assay (FDA approved for NASAL specimens only), is one component of a comprehensive MRSA colonization surveillance program. It is not intended to diagnose MRSA infection nor to guide or monitor treatment for MRSA infections. RESULT CALLED TO, READ BACK BY AND VERIFIED WITH: DAVIS,E RN 0214 03/21/16 MITCHELL,L     Imaging: Ct Head Wo Contrast  03/20/2016  CLINICAL DATA:  Patient status post witnessed fall. Abrasion to the left periorbital soft tissues. EXAM: CT HEAD WITHOUT CONTRAST CT CERVICAL SPINE WITHOUT CONTRAST TECHNIQUE: Multidetector CT imaging of the head and cervical spine was performed following the standard protocol without intravenous contrast. Multiplanar CT image reconstructions of the cervical spine were also generated. COMPARISON:  Brain CT 02/17/2016; 02/16/2016. FINDINGS: CT HEAD FINDINGS Ventricles and sulci are appropriate for patient's age.  Interval development of left-greater-than-right basal ganglia hypodensities. No evidence for acute intracranial hemorrhage, mass lesion or mass-effect. Orbits are unremarkable. Paranasal sinuses are well aerated. Mucosal thickening left maxillary sinus. Mastoid air cells are unremarkable. Calvarium is intact. CT CERVICAL SPINE FINDINGS Normal anatomic alignment. No evidence for acute cervical spine fracture. Craniocervical junction is intact. Preservation of the vertebral body and intervertebral disc space heights. 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 Lung apices are clear. Thyroid is unremarkable. Prevertebral soft tissues unremarkable. IMPRESSION: Interval development of hypodensities within the left-greater-than-right basal ganglia which may represent subacute infarcts. No acute cervical spine fracture. Electronically Signed   By: Francis Gainesrew  Davis M.D.  On: 03/20/2016 19:15   Ct Cervical Spine Wo Contrast  03/20/2016  CLINICAL DATA:  Patient status post witnessed fall. Abrasion to the left periorbital soft tissues. EXAM: CT HEAD WITHOUT CONTRAST CT CERVICAL SPINE WITHOUT CONTRAST TECHNIQUE: Multidetector CT imaging of the head and cervical spine was performed following the standard protocol without intravenous contrast. Multiplanar CT image reconstructions of the cervical spine were also generated. COMPARISON:  Brain CT 02/17/2016; 02/16/2016. FINDINGS: CT HEAD FINDINGS Ventricles and sulci are appropriate for patient's age. Interval development of left-greater-than-right basal ganglia hypodensities. No evidence for acute intracranial hemorrhage, mass lesion or mass-effect. Orbits are unremarkable. Paranasal sinuses are well aerated. Mucosal thickening left maxillary sinus. Mastoid air cells are unremarkable. Calvarium is intact. CT CERVICAL SPINE FINDINGS Normal anatomic alignment. No evidence for acute cervical spine fracture. Craniocervical junction is intact. Preservation of the vertebral body and  intervertebral disc space heights. 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 Lung apices are clear. Thyroid is unremarkable. Prevertebral soft tissues unremarkable. IMPRESSION: Interval development of hypodensities within the left-greater-than-right basal ganglia which may represent subacute infarcts. No acute cervical spine fracture. Electronically Signed   By: Annia Belt M.D.   On: 03/20/2016 19:15   Mr Laqueta Jean ZO Contrast  03/21/2016  CLINICAL DATA:  Found on floor. 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 EXAM: MRI HEAD WITHOUT AND WITH CONTRAST TECHNIQUE: Multiplanar, multiecho pulse sequences of the brain and surrounding structures were obtained without and with intravenous contrast. CONTRAST:  13mL MULTIHANCE GADOBENATE DIMEGLUMINE 529 MG/ML IV SOLN COMPARISON:  CT head 03/20/2016 FINDINGS: Image quality degraded by motion. Postcontrast images have significant motion. Edema in the basal ganglia bilaterally left greater than right as noted on CT. There is enhancement in the basal ganglia bilaterally. There is abnormal enhancement in the external capsule on the left, rim enhancing enhancement in the left internal capsule and medial basal ganglia. Abnormal enhancement in the left caudate. There is enhancement in the right caudate and right putamen. Many of these areas are within vascular distribution suggesting ischemia. Small area of enhancement in the right parietal cortex could also be an area of subacute infarct. Diffusion-weighted imaging shows restricted diffusion in the left putamen and caudate and possibly in the external capsule on the left. These are most likely areas of subacute infarct. Restricted diffusion also noted in the right parietal cortex where there is abnormal enhancement. Right basal ganglia does not show significant restricted diffusion but does show T2 shine through. Ventricle size normal. No shift of the midline structures. Negative for intracranial hemorrhage. IMPRESSION:  Image quality degraded by significant motion. There is extensive abnormal enhancement in the basal ganglia bilaterally. Some of these areas show restricted diffusion left basal ganglia. The enhancement pattern and vascular territory suggest the enhancement is related to subacute infarct related to meningitis 1 month ago. Similar area of enhancement and restricted diffusion in the right parietal cortex. No intracranial hemorrhage. Electronically Signed   By: Marlan Palau M.D.   On: 03/21/2016 19:06   Dg Chest Portable 1 View  03/20/2016  CLINICAL DATA:  Recent fall with altered mental status, initial encounter EXAM: PORTABLE CHEST 1 VIEW COMPARISON:  03/05/2016 FINDINGS: The heart size and mediastinal contours are within normal limits. Both lungs are clear. The visualized skeletal structures are unremarkable. IMPRESSION: No active disease. Electronically Signed   By: Alcide Clever M.D.   On: 03/20/2016 18:16    Will be seen by Dr. Lavon Paganini. Please see his attestation note for A/P for any additional work  up recommendations.    Assessment and plan:   Dale [name] is an 50 y.o. male patient with

## 2016-03-22 NOTE — Progress Notes (Signed)
PULMONARY / CRITICAL CARE MEDICINE   Name: Dale [name] MRN: [id number] DOB: 02/01/1966    ADMISSION DATE:  03/20/2016 CONSULTATION DATE:  03/20/16  REFERRING MD:  EDP  CHIEF COMPLAINT:  Hypothermia, fall from bed  HISTORY OF PRESENT ILLNESS:   Dale [name] is an unfortunate 15M who presents from the nursing home after being found down on the floor after falling from bed and sustaining abrasions to his forehead. On arrival to the ED he was noted to be markedly hypothermic with core temp 91.81F. 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 Course complicated by ARDS, NSTEMI, brief cardiac arrest & seizure , required trach According to nursing home staff, he is at his baseline with regards to mental status. He is unable to provide any history.  The ED physician spoke with his wife, who reports that he was "doing better" and wishes for him to be a full code this admission.      VITAL SIGNS: BP 90/68 mmHg  Pulse 74  Temp(Src) 98.3 F (36.8 C) (Axillary)  Resp 17  Ht 5\' 8"  (1.727 m)  Wt 130 lb 11.7 oz (59.3 kg)  BMI 19.88 kg/m2  SpO2 100%  HEMODYNAMICS:     VENTILATOR SETTINGS:    INTAKE / OUTPUT: I/O last 3 completed shifts: In: 4917.5 [I.V.:4617.5; IV Piggyback:300] Out: 1485 [Urine:1485]  PHYSICAL EXAMINATION:  General Well nourished, well developed, thin, no apparent distress, non-verbal, responsive to noxious stimuli.  HEENT No gross abnormalities. Oropharynx not assessed 2/2 lack of patient cooperation.  tracheotomy site c/d/i.   Pulmonary Clear to auscultation bilaterally with no wheezes, rales or ronchi. Good effort, symmetrical expansion.   Cardiovascular Normal rate, regular rhythm. S1, s2. No m/r/g. Distal pulses palpable.  Abdomen Soft, non-tender, non-distended, positive bowel sounds, no palpable organomegaly or masses. Normoresonant to percussion.  Musculoskeletal  Normal bulk and tone. Moves all extremities. No bony abnormalities.   Lymphatics No cervical, supraclavicular or axillary adenopathy.   Neurologic Grossly intact. No focal deficits. Non verbal - follows 1 step commands intermittently, no neck signs  Skin/Integuement No rash, no cyanosis, no clubbing. L hand somewhat edematous. No lower extremity edema.      LABS:  BMET  Recent Labs Lab 03/20/16 1800 03/21/16 0322 03/22/16 0443  NA 146* 150* 150*  K 4.2 4.3 4.2  CL 109 120* 117*  CO2 25 21* 23  BUN 22* 24* 16  CREATININE 1.43* 1.39* 1.67*  GLUCOSE 87 59* 81    Electrolytes  Recent Labs Lab 03/20/16 1800 03/21/16 0322 03/22/16 0443  CALCIUM 9.3 8.0* 8.7*  MG  --  1.7  --   PHOS  --  4.1  --     CBC  Recent Labs Lab 03/20/16 1800 03/21/16 0322 03/22/16 0443  WBC 5.6 6.2 5.3  HGB 11.5* 10.3* 10.7*  HCT 35.9* 32.8* 32.2*  PLT 336 307 243    Coag's No results for input(s): APTT, INR in the last 168 hours.  Sepsis Markers  Recent Labs Lab 03/20/16 1824 03/20/16 2044  LATICACIDVEN 1.38 0.47*    ABG No results for input(s): PHART, PCO2ART, PO2ART in the last 168 hours.  Liver Enzymes  Recent Labs Lab 03/20/16 1800  AST 28  ALT 40  ALKPHOS 69  BILITOT 0.5  ALBUMIN 2.9*    Cardiac Enzymes  Recent Labs Lab 03/21/16 0322 03/21/16 1240  TROPONINI <0.03 <0.03    Glucose  Recent Labs  Lab 03/21/16 1235 03/21/16 1600 03/21/16 1934 03/22/16 0024 03/22/16 0322 03/22/16 0710  GLUCAP 75 73 73 69 73 76    Imaging Mr Brain W Wo Contrast  03/21/2016  CLINICAL DATA:  Found on floor. 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 EXAM: MRI HEAD WITHOUT AND WITH CONTRAST TECHNIQUE: Multiplanar, multiecho pulse sequences of the brain and surrounding structures were obtained without and with intravenous contrast. CONTRAST:  13mL MULTIHANCE GADOBENATE DIMEGLUMINE 529 MG/ML IV SOLN COMPARISON:  CT head 03/20/2016 FINDINGS: Image quality degraded  by motion. Postcontrast images have significant motion. Edema in the basal ganglia bilaterally left greater than right as noted on CT. There is enhancement in the basal ganglia bilaterally. There is abnormal enhancement in the external capsule on the left, rim enhancing enhancement in the left internal capsule and medial basal ganglia. Abnormal enhancement in the left caudate. There is enhancement in the right caudate and right putamen. Many of these areas are within vascular distribution suggesting ischemia. Small area of enhancement in the right parietal cortex could also be an area of subacute infarct. Diffusion-weighted imaging shows restricted diffusion in the left putamen and caudate and possibly in the external capsule on the left. These are most likely areas of subacute infarct. Restricted diffusion also noted in the right parietal cortex where there is abnormal enhancement. Right basal ganglia does not show significant restricted diffusion but does show T2 shine through. Ventricle size normal. No shift of the midline structures. Negative for intracranial hemorrhage. IMPRESSION: Image quality degraded by significant motion. There is extensive abnormal enhancement in the basal ganglia bilaterally. Some of these areas show restricted diffusion left basal ganglia. The enhancement pattern and vascular territory suggest the enhancement is related to subacute infarct related to meningitis 1 month ago. Similar area of enhancement and restricted diffusion in the right parietal cortex. No intracranial hemorrhage. Electronically Signed   By: Marlan Palau M.D.   On: 03/21/2016 19:06    STUDIES:  Head CT 4/16 ?? Sub acute BG infarcts  CULTURES: CSF 4/16>>NGTD>> Blood 4/16>>NGTD>>  ANTIBIOTICS: Vancomycin Rocephin  SIGNIFICANT EVENTS:   LINES/TUBES: PIV  DISCUSSION: Dale [name] is an unfortunate gentleman who was found down and hypothermic. His baseline mental status and function is poor, but  reportedly unchanged. His blood pressures have been borderline and may worsen as he re-warms. Sepsis remains on the differential, although he has a normal white count and differential. Blood and CSF cultures are pending and he will be empirically covered for possible recurrent meningitis. TSH was adequate.  ASSESSMENT / PLAN:  PULMONARY A: No acute issues P:   Monitor  CARDIOVASCULAR A:  Borderline blood pressures  -improved with fluids(resolved) EF recovered after last VT arrest P:  Fluid resuscitate Maintain MAP >65 Resume amio/ coreg when able to take PO   RENAL A:  AKI  Hypernatremia P: Hydrate - use D51/2 NS  GASTROINTESTINAL A:   No acute issues P:   NPO for now  HEMATOLOGIC A:   No acute issues P:  Trend CBC  INFECTIOUS A:   Severe sepsis, concern for recurrent meningitis(resolved) CSF has high protein/ neutrophils, neg growth to date P:   Empiric coverage with vanc/rocephin Await Blood, CSF cultures   ENDOCRINE A:   Hypothermia resolved P:     NEUROLOGIC A:   Acute encephalopathy Sub acute CVA P:   RASS goal: 0 Haldol prn agitation Neuro consulted   FAMILY  - Updates: No family at bedside  - Inter-disciplinary family meet or Palliative Care meeting  due by:  day 7  PCCM will sign off 4/18.  Brett Canales Ignacia Gentzler ACNP [facility] PCCM Pager [phone] till 3 pm If no answer page [phone] 03/22/2016, 11:32 AM

## 2016-03-22 NOTE — Consult Note (Signed)
[facility]  Date of Admission:  03/20/2016  Date of Consult:  03/22/2016  Reason for Consult: alva Referring Physician: recurrent meningitis  Impression/Recommendation Recurrent meningitis  Needs TEE Await his BCx, CSF Cx HIV (neg 02-16-16) Check CH50 No change in atbx  Comment- With recurrent meningitis with GPC can see immunodeficiency syndrome, CSF leak. I am not sure his CT scan can r/o leak. IE is also a possibility  Thank you so much for this interesting consult,   Bobby Rumpf (pager) [phone] [email or web address]  ERSKINE STEINFELDT is an 50 y.o. male.  HPI: 50 yo M with hx homelessness, adm 3-14 to 03-10-16 with streptococcal (CSF Pneumococcus R-PEN) meningitis. His course was complicated by v-fib and respiratory arrest.  His course was also complicated by ARF due to ATN. He received 14 days of ceftriaxone, and weekly IM Pen G (for positive RPR 1:2). He was d/c to SNF. Non-verbal. At SNF his mental status was stable.  He returns on 4-16 after being found down. He was hypothermic. He was started on vanco/ceftriaxone. He had repeat LP showing 400 WBC (75%N). Prot > 600, Glc 37. He had solumedrol added. His CT head suggests subacute infarcts of basal ganglia. MRI also showed these and suggested them as residual from his previous meningitis.   Past Medical History  Diagnosis Date  . Depression   . Cardiac arrest ([place])   . Cardiogenic shock ([place])   . Acute respiratory failure with hypoxia ([place])   . Meningitis, streptococcal 02/16/2016  . 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 Tobacco abuse 02/16/2016  . Substance abuse   . ETOH abuse   . Septic shock [facility])     Past Surgical History  Procedure Laterality Date  . Ankle surgery    . Cardiac catheterization N/A 02/17/2016    Procedure: IABP Insertion;  Surgeon: Wellington Hampshire, MD;  Location: [facility];  Service: Cardiovascular;  Laterality: N/A;     No Known  Allergies  Medications:  Scheduled: . antiseptic oral rinse  7 mL Mouth Rinse q12n4p  . cefTRIAXone (ROCEPHIN)  IV  2 g Intravenous Q12H  . chlorhexidine  15 mL Mouth Rinse BID  . Chlorhexidine Gluconate Cloth  6 each Topical Q0600  . enoxaparin (LOVENOX) injection  40 mg Subcutaneous Q24H  . famotidine (PEPCID) IV  20 mg Intravenous Q24H  . insulin aspart  0-9 Units Subcutaneous 6 times per day  . levETIRAcetam  500 mg Intravenous Q12H  . methylPREDNISolone (SOLU-MEDROL) injection  125 mg Intravenous 3 times per day  . mupirocin ointment  1 application Nasal BID  . vancomycin  500 mg Intravenous Q12H    Abtx:  Anti-infectives    Start     Dose/Rate Route Frequency Ordered Stop   03/21/16 1000  vancomycin (VANCOCIN) 500 mg in sodium chloride 0.9 % 100 mL IVPB     500 mg 100 mL/hr over 60 Minutes Intravenous Every 12 hours 03/20/16 1940     03/21/16 0800  cefTRIAXone (ROCEPHIN) 2 g in dextrose 5 % 50 mL IVPB     2 g 100 mL/hr over 30 Minutes Intravenous Every 12 hours 03/21/16 0040     03/20/16 2000  vancomycin (VANCOCIN) IVPB 1000 mg/200 mL premix     1,000 mg 200 mL/hr over 60 Minutes Intravenous NOW 03/20/16 1940 03/20/16 2103   03/20/16 1930  cefTRIAXone (ROCEPHIN) 2 g in dextrose 5 % 50 mL IVPB     2 g 100  mL/hr over 30 Minutes Intravenous  Once 03/20/16 1919 03/20/16 2030      Total days of antibiotics: 2          Social History:  reports that he has been smoking Cigarettes.  He has been smoking about 0.10 packs per day. He does not have any smokeless tobacco history on file. He reports that he drinks alcohol. He reports that he uses illicit drugs (Marijuana).  History reviewed. No pertinent family history.  General ROS: unobtainable, pt non-verbal.   Blood pressure 112/82, pulse 73, temperature 97.2 F (36.2 C), temperature source Oral, resp. rate 14, height _0  (1.727 m), weight 59.3 kg (130 lb 11.7 oz), SpO2 100 %. General appearance: fatigued, no distress and  non-verbal, does not keep eyes open. does not follow commands.  Head: small, superficial laceration on on R scalp.  Eyes: negative findings: conjunctivae and sclerae normal, pupils equal, round, reactive to light and accomodation and no photophobia.  Throat: lips normal. will not open mouth.  Neck: no adenopathy, supple, symmetrical, trachea midline and no rigidity.  Lungs: clear to auscultation bilaterally Heart: regular rate and rhythm Abdomen: normal findings: bowel sounds normal and soft, non-tender Extremities: edema none   Results for orders placed or performed during the hospital encounter of 03/20/16 (from the past 48 hour(s))  CBC with Differential     Status: Abnormal   Collection Time: 03/20/16  6:00 PM  Result Value Ref Range   WBC 5.6 4.0 - 10.5 K/uL   RBC 3.87 (L) 4.22 - 5.81 MIL/uL   Hemoglobin 11.5 (L) 13.0 - 17.0 g/dL   HCT 35.9 (L) 39.0 - 52.0 %   MCV 92.8 78.0 - 100.0 fL   MCH 29.7 26.0 - 34.0 pg   MCHC 32.0 30.0 - 36.0 g/dL   RDW 15.1 11.5 - 15.5 %   Platelets 336 150 - 400 K/uL   Neutrophils Relative % 56 %   Neutro Abs 3.1 1.7 - 7.7 K/uL   Lymphocytes Relative 34 %   Lymphs Abs 1.9 0.7 - 4.0 K/uL   Monocytes Relative 8 %   Monocytes Absolute 0.5 0.1 - 1.0 K/uL   Eosinophils Relative 1 %   Eosinophils Absolute 0.1 0.0 - 0.7 K/uL   Basophils Relative 1 %   Basophils Absolute 0.0 0.0 - 0.1 K/uL  Comprehensive metabolic panel     Status: Abnormal   Collection Time: 03/20/16  6:00 PM  Result Value Ref Range   Sodium 146 (H) 135 - 145 mmol/L   Potassium 4.2 3.5 - 5.1 mmol/L   Chloride 109 101 - 111 mmol/L   CO2 25 22 - 32 mmol/L   Glucose, Bld 87 65 - 99 mg/dL   BUN 22 (H) 6 - 20 mg/dL   Creatinine, Ser 1.43 (H) 0.61 - 1.24 mg/dL   Calcium 9.3 8.9 - 10.3 mg/dL   Total Protein 6.8 6.5 - 8.1 g/dL   Albumin 2.9 (L) 3.5 - 5.0 g/dL   AST 28 15 - 41 U/L   ALT 40 17 - 63 U/L   Alkaline Phosphatase 69 38 - 126 U/L   Total Bilirubin 0.5 0.3 - 1.2 mg/dL   GFR  calc non Af Amer 55 (L) >60 mL/min   GFR calc Af Amer >60 >60 mL/min    Comment: (NOTE) The eGFR has been calculated using the CKD EPI equation. This calculation has not been validated in all clinical situations. eGFR's persistently <60 mL/min signify possible Chronic Kidney Disease.  Anion gap 12 5 - 15  Blood culture (routine x 2)     Status: None (Preliminary result)   Collection Time: 03/20/16  6:00 PM  Result Value Ref Range   Specimen Description BLOOD RIGHT ARM    Special Requests BOTTLES DRAWN AEROBIC AND ANAEROBIC 5CC    Culture NO GROWTH 2 DAYS    Report Status PENDING   Brain natriuretic peptide     Status: None   Collection Time: 03/20/16  6:00 PM  Result Value Ref Range   B Natriuretic Peptide 23.8 0.0 - 100.0 pg/mL  Blood culture (routine x 2)     Status: None (Preliminary result)   Collection Time: 03/20/16  6:10 PM  Result Value Ref Range   Specimen Description BLOOD RIGHT HAND    Special Requests BOTTLES DRAWN AEROBIC AND ANAEROBIC 5CC    Culture NO GROWTH 2 DAYS    Report Status PENDING   I-Stat Troponin, ED - 0, 3, 6 hours (not at [facility])     Status: None   Collection Time: 03/20/16  6:21 PM  Result Value Ref Range   Troponin i, poc 0.00 0.00 - 0.08 ng/mL   Comment 3            Comment: Due to the release kinetics of cTnI, a negative result within the first hours of the onset of symptoms does not rule out myocardial infarction with certainty. If myocardial infarction is still suspected, repeat the test at appropriate intervals.   I-Stat CG4 Lactic Acid, ED     Status: None   Collection Time: 03/20/16  6:24 PM  Result Value Ref Range   Lactic Acid, Venous 1.38 0.5 - 2.0 mmol/L  CBG monitoring, ED     Status: None   Collection Time: 03/20/16  7:05 PM  Result Value Ref Range   Glucose-Capillary 81 65 - 99 mg/dL  Urinalysis, Routine w reflex microscopic (not at [facility])     Status: Abnormal   Collection Time: 03/20/16  7:10 PM  Result Value Ref Range    Color, Urine YELLOW YELLOW   APPearance CLEAR CLEAR   Specific Gravity, Urine 1.017 1.005 - 1.030   pH 5.0 5.0 - 8.0   Glucose, UA NEGATIVE NEGATIVE mg/dL   Hgb urine dipstick NEGATIVE NEGATIVE   Bilirubin Urine NEGATIVE NEGATIVE   Ketones, ur NEGATIVE NEGATIVE mg/dL   Protein, ur NEGATIVE NEGATIVE mg/dL   Nitrite NEGATIVE NEGATIVE   Leukocytes, UA TRACE (A) NEGATIVE  Urine microscopic-add on     Status: Abnormal   Collection Time: 03/20/16  7:10 PM  Result Value Ref Range   Squamous Epithelial / LPF 0-5 (A) NONE SEEN   WBC, UA 0-5 0 - 5 WBC/hpf   RBC / HPF 0-5 0 - 5 RBC/hpf   Bacteria, UA NONE SEEN NONE SEEN  TSH     Status: None   Collection Time: 03/20/16  8:00 PM  Result Value Ref Range   TSH 2.712 0.350 - 4.500 uIU/mL  T4, free     Status: None   Collection Time: 03/20/16  8:00 PM  Result Value Ref Range   Free T4 0.96 0.61 - 1.12 ng/dL  I-Stat Troponin, ED - 0, 3, 6 hours (not at [facility])     Status: None   Collection Time: 03/20/16  8:42 PM  Result Value Ref Range   Troponin i, poc 0.00 0.00 - 0.08 ng/mL   Comment 3  Comment: Due to the release kinetics of cTnI, a negative result within the first hours of the onset of symptoms does not rule out myocardial infarction with certainty. If myocardial infarction is still suspected, repeat the test at appropriate intervals.   I-Stat CG4 Lactic Acid, ED     Status: Abnormal   Collection Time: 03/20/16  8:44 PM  Result Value Ref Range   Lactic Acid, Venous 0.47 (L) 0.5 - 2.0 mmol/L  CSF cell count with differential collection tube #: 1     Status: Abnormal   Collection Time: 03/20/16 10:10 PM  Result Value Ref Range   Tube # 1    Color, CSF YELLOW (A) COLORLESS   Appearance, CSF HAZY (A) CLEAR   Supernatant XANTHOCHROMIC    RBC Count, CSF 73 (H) 0 /cu mm   WBC, CSF 200 (HH) 0 - 5 /cu mm    Comment: CRITICAL RESULT CALLED TO, READ BACK BY AND VERIFIED WITH: SARINE REBECCA RN AT 0024 ON 04.17.2017 BY COCHRANE S     Segmented Neutrophils-CSF 78 (H) 0 - 6 %   Lymphs, CSF 7 (L) 40 - 80 %   Monocyte-Macrophage-Spinal Fluid 15 15 - 45 %   Eosinophils, CSF 0 0 - 1 %  CSF cell count with differential collection tube #: 4     Status: Abnormal   Collection Time: 03/20/16 10:10 PM  Result Value Ref Range   Tube # 4    Color, CSF YELLOW (A) COLORLESS   Appearance, CSF HAZY (A) CLEAR   Supernatant XANTHOCHROMIC    RBC Count, CSF 3 (H) 0 /cu mm   WBC, CSF 400 (HH) 0 - 5 /cu mm    Comment: CRITICAL RESULT CALLED TO, READ BACK BY AND VERIFIED WITH: SARINE REBECCA RN AT 0024 ON 04.17.17 BY COCHRANE S    Segmented Neutrophils-CSF 75 (H) 0 - 6 %   Lymphs, CSF 9 (L) 40 - 80 %   Monocyte-Macrophage-Spinal Fluid 16 15 - 45 %   Eosinophils, CSF 0 0 - 1 %  Pathologist smear review     Status: None   Collection Time: 03/20/16 10:10 PM  Result Value Ref Range   Path Review Increased neutrophils.     Comment: Reviewed by Chrystie Nose. Saralyn Pilar, M.D. 03/21/16   CSF culture     Status: None (Preliminary result)   Collection Time: 03/20/16 10:12 PM  Result Value Ref Range   Specimen Description CSF    Special Requests TUBE 2  2CC    Gram Stain      CYTOSPIN SLIDE WBC PRESENT,BOTH PMN AND MONONUCLEAR NO ORGANISMS SEEN    Culture NO GROWTH 2 DAYS    Report Status PENDING   Glucose, CSF     Status: Abnormal   Collection Time: 03/20/16 10:13 PM  Result Value Ref Range   Glucose, CSF 37 (L) 40 - 70 mg/dL  Protein, CSF     Status: Abnormal   Collection Time: 03/20/16 10:13 PM  Result Value Ref Range   Total  Protein, CSF >600 (H) 15 - 45 mg/dL    Comment: RESULTS CONFIRMED BY MANUAL DILUTION  MRSA PCR Screening     Status: Abnormal   Collection Time: 03/20/16 11:00 PM  Result Value Ref Range   MRSA by PCR POSITIVE (A) NEGATIVE    Comment:        The GeneXpert MRSA Assay (FDA approved for NASAL specimens only), is one component of a comprehensive MRSA colonization surveillance program. It  is not intended to  diagnose MRSA infection nor to guide or monitor treatment for MRSA infections. RESULT CALLED TO, READ BACK BY AND VERIFIED WITH: DAVIS,E RN 0214 03/21/16 MITCHELL,L   Glucose, capillary     Status: None   Collection Time: 03/20/16 11:04 PM  Result Value Ref Range   Glucose-Capillary 65 65 - 99 mg/dL   Comment 1 Notify RN    Comment 2 Document in Chart   Glucose, capillary     Status: Abnormal   Collection Time: 03/21/16 12:05 AM  Result Value Ref Range   Glucose-Capillary 121 (H) 65 - 99 mg/dL   Comment 1 Notify RN    Comment 2 Document in Chart   CBC     Status: Abnormal   Collection Time: 03/21/16  3:22 AM  Result Value Ref Range   WBC 6.2 4.0 - 10.5 K/uL   RBC 3.57 (L) 4.22 - 5.81 MIL/uL   Hemoglobin 10.3 (L) 13.0 - 17.0 g/dL   HCT 32.8 (L) 39.0 - 52.0 %   MCV 91.9 78.0 - 100.0 fL   MCH 28.9 26.0 - 34.0 pg   MCHC 31.4 30.0 - 36.0 g/dL   RDW 14.8 11.5 - 15.5 %   Platelets 307 150 - 400 K/uL  Basic metabolic panel     Status: Abnormal   Collection Time: 03/21/16  3:22 AM  Result Value Ref Range   Sodium 150 (H) 135 - 145 mmol/L   Potassium 4.3 3.5 - 5.1 mmol/L   Chloride 120 (H) 101 - 111 mmol/L   CO2 21 (L) 22 - 32 mmol/L   Glucose, Bld 59 (L) 65 - 99 mg/dL   BUN 24 (H) 6 - 20 mg/dL   Creatinine, Ser 1.39 (H) 0.61 - 1.24 mg/dL   Calcium 8.0 (L) 8.9 - 10.3 mg/dL   GFR calc non Af Amer 57 (L) >60 mL/min   GFR calc Af Amer >60 >60 mL/min    Comment: (NOTE) The eGFR has been calculated using the CKD EPI equation. This calculation has not been validated in all clinical situations. eGFR's persistently <60 mL/min signify possible Chronic Kidney Disease.    Anion gap 9 5 - 15  Magnesium     Status: None   Collection Time: 03/21/16  3:22 AM  Result Value Ref Range   Magnesium 1.7 1.7 - 2.4 mg/dL  Phosphorus     Status: None   Collection Time: 03/21/16  3:22 AM  Result Value Ref Range   Phosphorus 4.1 2.5 - 4.6 mg/dL  Troponin I (q 6hr x 3)     Status: None    Collection Time: 03/21/16  3:22 AM  Result Value Ref Range   Troponin I <0.03 <0.031 ng/mL    Comment:        NO INDICATION OF MYOCARDIAL INJURY.   Glucose, capillary     Status: Abnormal   Collection Time: 03/21/16  3:45 AM  Result Value Ref Range   Glucose-Capillary 64 (L) 65 - 99 mg/dL   Comment 1 Notify RN    Comment 2 Document in Chart   Glucose, capillary     Status: Abnormal   Collection Time: 03/21/16  4:21 AM  Result Value Ref Range   Glucose-Capillary 121 (H) 65 - 99 mg/dL   Comment 1 Capillary Specimen   Glucose, capillary     Status: None   Collection Time: 03/21/16  8:39 AM  Result Value Ref Range   Glucose-Capillary 73 65 - 99 mg/dL  Comment 1 Venous Specimen   Glucose, capillary     Status: None   Collection Time: 03/21/16 12:35 PM  Result Value Ref Range   Glucose-Capillary 75 65 - 99 mg/dL   Comment 1 Venous Specimen   Troponin I (q 6hr x 3)     Status: None   Collection Time: 03/21/16 12:40 PM  Result Value Ref Range   Troponin I <0.03 <0.031 ng/mL    Comment:        NO INDICATION OF MYOCARDIAL INJURY.   Glucose, capillary     Status: None   Collection Time: 03/21/16  4:00 PM  Result Value Ref Range   Glucose-Capillary 73 65 - 99 mg/dL   Comment 1 Notify RN   Glucose, capillary     Status: None   Collection Time: 03/21/16  7:34 PM  Result Value Ref Range   Glucose-Capillary 73 65 - 99 mg/dL   Comment 1 Notify RN    Comment 2 Document in Chart   Glucose, capillary     Status: None   Collection Time: 03/22/16 12:24 AM  Result Value Ref Range   Glucose-Capillary 69 65 - 99 mg/dL  Glucose, capillary     Status: None   Collection Time: 03/22/16  3:22 AM  Result Value Ref Range   Glucose-Capillary 73 65 - 99 mg/dL  Basic metabolic panel     Status: Abnormal   Collection Time: 03/22/16  4:43 AM  Result Value Ref Range   Sodium 150 (H) 135 - 145 mmol/L   Potassium 4.2 3.5 - 5.1 mmol/L   Chloride 117 (H) 101 - 111 mmol/L   CO2 23 22 - 32 mmol/L    Glucose, Bld 81 65 - 99 mg/dL   BUN 16 6 - 20 mg/dL   Creatinine, Ser 1.67 (H) 0.61 - 1.24 mg/dL   Calcium 8.7 (L) 8.9 - 10.3 mg/dL   GFR calc non Af Amer 46 (L) >60 mL/min   GFR calc Af Amer 53 (L) >60 mL/min    Comment: (NOTE) The eGFR has been calculated using the CKD EPI equation. This calculation has not been validated in all clinical situations. eGFR's persistently <60 mL/min signify possible Chronic Kidney Disease.    Anion gap 10 5 - 15  CBC     Status: Abnormal   Collection Time: 03/22/16  4:43 AM  Result Value Ref Range   WBC 5.3 4.0 - 10.5 K/uL   RBC 3.50 (L) 4.22 - 5.81 MIL/uL   Hemoglobin 10.7 (L) 13.0 - 17.0 g/dL   HCT 32.2 (L) 39.0 - 52.0 %   MCV 92.0 78.0 - 100.0 fL   MCH 30.6 26.0 - 34.0 pg   MCHC 33.2 30.0 - 36.0 g/dL   RDW 15.1 11.5 - 15.5 %   Platelets 243 150 - 400 K/uL  Glucose, capillary     Status: None   Collection Time: 03/22/16  7:10 AM  Result Value Ref Range   Glucose-Capillary 76 65 - 99 mg/dL   Comment 1 Notify RN    Comment 2 Document in Chart   Glucose, capillary     Status: None   Collection Time: 03/22/16 12:02 PM  Result Value Ref Range   Glucose-Capillary 90 65 - 99 mg/dL   Comment 1 Notify RN   Glucose, capillary     Status: Abnormal   Collection Time: 03/22/16  2:57 PM  Result Value Ref Range   Glucose-Capillary 122 (H) 65 - 99 mg/dL   Comment  1 Notify RN       Component Value Date/Time   SDES CSF 03/20/2016 2212   SPECREQUEST TUBE 2  2CC 03/20/2016 2212   CULT NO GROWTH 2 DAYS 03/20/2016 2212   REPTSTATUS PENDING 03/20/2016 2212   Ct Angio Head W/cm &/or Wo Cm  03/22/2016  CLINICAL DATA:  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 Now with multiple basal ganglia infarcts. EXAM: CT ANGIOGRAPHY HEAD AND NECK TECHNIQUE: Multidetector CT imaging of the head and neck was performed using the standard protocol during bolus administration of intravenous contrast. Multiplanar CT image reconstructions and MIPs were obtained to evaluate the  vascular anatomy. Carotid stenosis measurements (when applicable) are obtained utilizing NASCET criteria, using the distal internal carotid diameter as the denominator. CONTRAST:  50 mL Isovue 370 IV COMPARISON:  MRI 03/21/2016 FINDINGS: CT HEAD Brain: Generalized atrophy. Multiple areas of enhancement in the basal ganglia bilaterally best seen on MRI. Patchy low-density areas are noted in the basal ganglia bilaterally which may be due to infarction. No acute hemorrhage or mass lesion. No shift of the midline structures. Calvarium and skull base: Negative Paranasal sinuses: Mucosal edema left maxillary sinus. Mild mucosal edema sphenoid sinus. Orbits: Negative CTA NECK Aortic arch: Normal aortic arch. Proximal great vessels widely patent. Bovine branching pattern. Right carotid system: Right common carotid artery widely patent. Right carotid bifurcation widely patent Left carotid system: Left common carotid artery widely patent. Left carotid bifurcation widely patent. Vertebral arteries:Both vertebral arteries widely patent without significant stenosis. Skeleton: Negative Other neck: Negative CTA HEAD Anterior circulation: Cavernous carotid is patent bilaterally. Anterior and middle cerebral arteries are patent bilaterally. No occluded segments. The A1 and M1 segments are smaller in caliber wall caliber than expected. This could be due to basilar meningitis or spasm. MCA branches appear patent bilateral without significant irregularity to suggest vasculitis. Posterior circulation: Both vertebral arteries patent to the basilar. PICA patent bilaterally. Basilar widely patent. Superior cerebellar and posterior cerebral arteries patent bilaterally without stenosis. Venous sinuses: Negative Anatomic variants: Negative Delayed phase: Subtle areas of enhancement in the left basal ganglia as noted on MRI. No definite abnormal enhancement in the right basal ganglia. Small area of cortical enhancement in the right parietal  cortex. These areas are most consistent with subacute infarction. IMPRESSION: Circle of Willis patent without focal stenosis or branch occlusion. Anterior and middle cerebral arteries show diffuse narrowing which may be due to basilar meningitis or vasospasm. This is a subjective findings and is diffuse. Negative CTA neck Subtle enhancement in the left basal ganglia and right parietal cortex corresponding to areas of abnormal enhancement on the MRI. 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 Electronically Signed   By: Marlan Palau M.D.   On: 03/22/2016 12:24   Ct Head Wo Contrast  03/20/2016  CLINICAL DATA:  Patient status post witnessed fall. Abrasion to the left periorbital soft tissues. EXAM: CT HEAD WITHOUT CONTRAST CT CERVICAL SPINE WITHOUT CONTRAST TECHNIQUE: Multidetector CT imaging of the head and cervical spine was performed following the standard protocol without intravenous contrast. Multiplanar CT image reconstructions of the cervical spine were also generated. COMPARISON:  Brain CT 02/17/2016; 02/16/2016. FINDINGS: CT HEAD FINDINGS Ventricles and sulci are appropriate for patient's age. Interval development of left-greater-than-right basal ganglia hypodensities. No evidence for acute intracranial hemorrhage, mass lesion or mass-effect. Orbits are unremarkable. Paranasal sinuses are well aerated. Mucosal thickening left maxillary sinus. Mastoid air cells are unremarkable. Calvarium is intact. CT  CERVICAL SPINE FINDINGS Normal anatomic alignment. No evidence for acute cervical spine fracture. Craniocervical junction is intact. Preservation of the vertebral body and intervertebral disc space heights. 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 Lung apices are clear. Thyroid is unremarkable. Prevertebral soft tissues unremarkable. IMPRESSION: Interval development of hypodensities within the  left-greater-than-right basal ganglia which may represent subacute infarcts. No acute cervical spine fracture. Electronically Signed   By: Lovey Newcomer M.D.   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 Now with multiple basal ganglia infarcts. EXAM: CT ANGIOGRAPHY HEAD AND NECK TECHNIQUE: Multidetector CT imaging of the head and neck was performed using the standard protocol during bolus administration of intravenous contrast. Multiplanar CT image reconstructions and MIPs were obtained to evaluate the vascular anatomy. Carotid stenosis measurements (when applicable) are obtained utilizing NASCET criteria, using the distal internal carotid diameter as the denominator. CONTRAST:  50 mL Isovue 370 IV COMPARISON:  MRI 03/21/2016 FINDINGS: CT HEAD Brain: Generalized atrophy. Multiple areas of enhancement in the basal ganglia bilaterally best seen on MRI. Patchy low-density areas are noted in the basal ganglia bilaterally which may be due to infarction. No acute hemorrhage or mass lesion. No shift of the midline structures. Calvarium and skull base: Negative Paranasal sinuses: Mucosal edema left maxillary sinus. Mild mucosal edema sphenoid sinus. Orbits: Negative CTA NECK Aortic arch: Normal aortic arch. Proximal great vessels widely patent. Bovine branching pattern. Right carotid system: Right common carotid artery widely patent. Right carotid bifurcation widely patent Left carotid system: Left common carotid artery widely patent. Left carotid bifurcation widely patent. Vertebral arteries:Both vertebral arteries widely patent without significant stenosis. Skeleton: Negative Other neck: Negative CTA HEAD Anterior circulation: Cavernous carotid is patent bilaterally. Anterior and middle cerebral arteries are patent bilaterally. No occluded segments. The A1 and M1 segments are smaller in caliber wall caliber than expected. This could be due to  basilar meningitis or spasm. MCA branches appear patent bilateral without significant irregularity to suggest vasculitis. Posterior circulation: Both vertebral arteries patent to the basilar. PICA patent bilaterally. Basilar widely patent. Superior cerebellar and posterior cerebral arteries patent bilaterally without stenosis. Venous sinuses: Negative Anatomic variants: Negative Delayed phase: Subtle areas of enhancement in the left basal ganglia as noted on MRI. No definite abnormal enhancement in the right basal ganglia. Small area of cortical enhancement in the right parietal cortex. These areas are most consistent with subacute infarction. IMPRESSION: Circle of Willis patent without focal stenosis or branch occlusion. Anterior and middle cerebral arteries show diffuse narrowing which may be due to basilar meningitis or vasospasm. This is a subjective findings and is diffuse. Negative CTA neck Subtle enhancement in the left basal ganglia and right parietal cortex corresponding to areas of abnormal enhancement on the MRI. 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 Electronically Signed   By: Franchot Gallo M.D.   On: 03/22/2016 12:24   Ct Cervical Spine Wo Contrast  03/20/2016  CLINICAL DATA:  Patient status post witnessed fall. Abrasion to the left periorbital soft tissues. EXAM: CT HEAD WITHOUT CONTRAST CT CERVICAL SPINE WITHOUT CONTRAST TECHNIQUE: Multidetector CT imaging of the head and cervical spine was performed following the standard protocol without intravenous contrast. Multiplanar CT image reconstructions of the cervical spine were also generated. COMPARISON:  Brain CT 02/17/2016; 02/16/2016. FINDINGS: CT HEAD FINDINGS Ventricles and sulci are appropriate for patient's age. Interval development  of left-greater-than-right basal ganglia hypodensities. No evidence for acute intracranial hemorrhage, mass  lesion or mass-effect. Orbits are unremarkable. Paranasal sinuses are well aerated. Mucosal thickening left maxillary sinus. Mastoid air cells are unremarkable. Calvarium is intact. CT CERVICAL SPINE FINDINGS Normal anatomic alignment. No evidence for acute cervical spine fracture. Craniocervical junction is intact. Preservation of the vertebral body and intervertebral disc space heights. 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 Lung apices are clear. Thyroid is unremarkable. Prevertebral soft tissues unremarkable. IMPRESSION: Interval development of hypodensities within the left-greater-than-right basal ganglia which may represent subacute infarcts. No acute cervical spine fracture. Electronically Signed   By: Lovey Newcomer M.D.   On: 03/20/2016 19:15   Mr Jeri Cos BD Contrast  03/21/2016  CLINICAL DATA:  Found on floor. 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 EXAM: MRI HEAD WITHOUT AND WITH CONTRAST TECHNIQUE: Multiplanar, multiecho pulse sequences of the brain and surrounding structures were obtained without and with intravenous contrast. CONTRAST:  46m MULTIHANCE GADOBENATE DIMEGLUMINE 529 MG/ML IV SOLN COMPARISON:  CT head 03/20/2016 FINDINGS: Image quality degraded by motion. Postcontrast images have significant motion. Edema in the basal ganglia bilaterally left greater than right as noted on CT. There is enhancement in the basal ganglia bilaterally. There is abnormal enhancement in the external capsule on the left, rim enhancing enhancement in the left internal capsule and medial basal ganglia. Abnormal enhancement in the left caudate. There is enhancement in the right caudate and right putamen. Many of these areas are within vascular distribution suggesting ischemia. Small area of enhancement in the right parietal cortex could also be an area of subacute infarct. Diffusion-weighted imaging shows restricted diffusion in the left putamen and caudate and possibly in the external capsule on the  left. These are most likely areas of subacute infarct. Restricted diffusion also noted in the right parietal cortex where there is abnormal enhancement. Right basal ganglia does not show significant restricted diffusion but does show T2 shine through. Ventricle size normal. No shift of the midline structures. Negative for intracranial hemorrhage. IMPRESSION: Image quality degraded by significant motion. There is extensive abnormal enhancement in the basal ganglia bilaterally. Some of these areas show restricted diffusion left basal ganglia. The enhancement pattern and vascular territory suggest the enhancement is related to subacute infarct related to meningitis 1 month ago. Similar area of enhancement and restricted diffusion in the right parietal cortex. No intracranial hemorrhage. Electronically Signed   By: CFranchot GalloM.D.   On: 03/21/2016 19:06   Dg Chest Portable 1 View  03/20/2016  CLINICAL DATA:  Recent fall with altered mental status, initial encounter EXAM: PORTABLE CHEST 1 VIEW COMPARISON:  03/05/2016 FINDINGS: The heart size and mediastinal contours are within normal limits. Both lungs are clear. The visualized skeletal structures are unremarkable. IMPRESSION: No active disease. Electronically Signed   By: MInez CatalinaM.D.   On: 03/20/2016 18:16   Recent Results (from the past 240 hour(s))  Blood culture (routine x 2)     Status: None (Preliminary result)   Collection Time: 03/20/16  6:00 PM  Result Value Ref Range Status   Specimen Description BLOOD RIGHT ARM  Final   Special Requests BOTTLES DRAWN AEROBIC AND ANAEROBIC 5CC  Final   Culture NO GROWTH 2 DAYS  Final   Report Status PENDING  Incomplete  Blood culture (routine x 2)     Status: None (Preliminary result)   Collection Time: 03/20/16  6:10 PM  Result Value Ref Range Status   Specimen Description BLOOD RIGHT  HAND  Final   Special Requests BOTTLES DRAWN AEROBIC AND ANAEROBIC 5CC  Final   Culture NO GROWTH 2 DAYS  Final    Report Status PENDING  Incomplete  CSF culture     Status: None (Preliminary result)   Collection Time: 03/20/16 10:12 PM  Result Value Ref Range Status   Specimen Description CSF  Final   Special Requests TUBE 2  2CC  Final   Gram Stain   Final    CYTOSPIN SLIDE WBC PRESENT,BOTH PMN AND MONONUCLEAR NO ORGANISMS SEEN    Culture NO GROWTH 2 DAYS  Final   Report Status PENDING  Incomplete  MRSA PCR Screening     Status: Abnormal   Collection Time: 03/20/16 11:00 PM  Result Value Ref Range Status   MRSA by PCR POSITIVE (A) NEGATIVE Final    Comment:        The GeneXpert MRSA Assay (FDA approved for NASAL specimens only), is one component of a comprehensive MRSA colonization surveillance program. It is not intended to diagnose MRSA infection nor to guide or monitor treatment for MRSA infections. RESULT CALLED TO, READ BACK BY AND VERIFIED WITH: DAVIS,E RN 0214 03/21/16 MITCHELL,L       03/22/2016, 4:46 PM     LOS: 2 days    Records and images were personally reviewed where available.

## 2016-03-23 ENCOUNTER — Inpatient Hospital Stay (HOSPITAL_COMMUNITY): Payer: Medicaid Other

## 2016-03-23 DIAGNOSIS — R569 Unspecified convulsions: Secondary | ICD-10-CM

## 2016-03-23 DIAGNOSIS — H6123 Impacted cerumen, bilateral: Secondary | ICD-10-CM

## 2016-03-23 DIAGNOSIS — I6330 Cerebral infarction due to thrombosis of unspecified cerebral artery: Secondary | ICD-10-CM

## 2016-03-23 LAB — CBC WITH DIFFERENTIAL/PLATELET
BASOS ABS: 0 10*3/uL (ref 0.0–0.1)
BASOS PCT: 0 %
EOS ABS: 0 10*3/uL (ref 0.0–0.7)
EOS PCT: 0 %
HCT: 34 % — ABNORMAL LOW (ref 39.0–52.0)
HEMOGLOBIN: 10.8 g/dL — AB (ref 13.0–17.0)
Lymphocytes Relative: 16 %
Lymphs Abs: 1.4 10*3/uL (ref 0.7–4.0)
MCH: 28.5 pg (ref 26.0–34.0)
MCHC: 31.8 g/dL (ref 30.0–36.0)
MCV: 89.7 fL (ref 78.0–100.0)
Monocytes Absolute: 0.1 10*3/uL (ref 0.1–1.0)
Monocytes Relative: 1 %
NEUTROS PCT: 83 %
Neutro Abs: 7.3 10*3/uL (ref 1.7–7.7)
PLATELETS: 275 10*3/uL (ref 150–400)
RBC: 3.79 MIL/uL — ABNORMAL LOW (ref 4.22–5.81)
RDW: 14.6 % (ref 11.5–15.5)
WBC: 8.8 10*3/uL (ref 4.0–10.5)

## 2016-03-23 LAB — LACTIC ACID, PLASMA: LACTIC ACID, VENOUS: 1.2 mmol/L (ref 0.5–2.0)

## 2016-03-23 LAB — T-HELPER CELLS (CD4) COUNT (NOT AT ARMC)
CD4 T CELL ABS: 340 /uL — AB (ref 400–2700)
CD4 T CELL HELPER: 52 % (ref 33–55)

## 2016-03-23 LAB — COMPREHENSIVE METABOLIC PANEL
ALT: 32 U/L (ref 17–63)
AST: 19 U/L (ref 15–41)
Albumin: 2.1 g/dL — ABNORMAL LOW (ref 3.5–5.0)
Alkaline Phosphatase: 62 U/L (ref 38–126)
Anion gap: 13 (ref 5–15)
BILIRUBIN TOTAL: 0.3 mg/dL (ref 0.3–1.2)
BUN: 20 mg/dL (ref 6–20)
CALCIUM: 8.8 mg/dL — AB (ref 8.9–10.3)
CHLORIDE: 117 mmol/L — AB (ref 101–111)
CO2: 18 mmol/L — ABNORMAL LOW (ref 22–32)
CREATININE: 1.83 mg/dL — AB (ref 0.61–1.24)
GFR, EST AFRICAN AMERICAN: 48 mL/min — AB (ref >60)
GFR, EST NON AFRICAN AMERICAN: 41 mL/min — AB (ref >60)
Glucose, Bld: 124 mg/dL — ABNORMAL HIGH (ref 65–99)
Potassium: 4.3 mmol/L (ref 3.5–5.1)
Sodium: 148 mmol/L — ABNORMAL HIGH (ref 135–145)
TOTAL PROTEIN: 6.2 g/dL — AB (ref 6.5–8.1)

## 2016-03-23 LAB — GLUCOSE, CAPILLARY
GLUCOSE-CAPILLARY: 128 mg/dL — AB (ref 65–99)
GLUCOSE-CAPILLARY: 138 mg/dL — AB (ref 65–99)
Glucose-Capillary: 115 mg/dL — ABNORMAL HIGH (ref 65–99)
Glucose-Capillary: 116 mg/dL — ABNORMAL HIGH (ref 65–99)
Glucose-Capillary: 121 mg/dL — ABNORMAL HIGH (ref 65–99)
Glucose-Capillary: 123 mg/dL — ABNORMAL HIGH (ref 65–99)
Glucose-Capillary: 123 mg/dL — ABNORMAL HIGH (ref 65–99)

## 2016-03-23 LAB — COMPLEMENT, TOTAL: Compl, Total (CH50): >60 U/mL — ABNORMAL HIGH (ref 42–60)

## 2016-03-23 LAB — MAGNESIUM: MAGNESIUM: 1.8 mg/dL (ref 1.7–2.4)

## 2016-03-23 MED ORDER — THIAMINE HCL 100 MG/ML IJ SOLN
100.0000 mg | Freq: Every day | INTRAMUSCULAR | Status: DC
  Administered 2016-03-23 – 2016-03-24 (×2): 100 mg via INTRAVENOUS
  Filled 2016-03-23 (×2): qty 2

## 2016-03-23 MED ORDER — LORAZEPAM 2 MG/ML IJ SOLN
1.0000 mg | Freq: Once | INTRAMUSCULAR | Status: AC
  Administered 2016-03-23: 1 mg via INTRAVENOUS
  Filled 2016-03-23: qty 1

## 2016-03-23 MED ORDER — DEXTROSE 5 % IV SOLN
500.0000 mg | Freq: Three times a day (TID) | INTRAVENOUS | Status: DC
  Administered 2016-03-23 – 2016-03-26 (×10): 500 mg via INTRAVENOUS
  Filled 2016-03-23 (×13): qty 5

## 2016-03-23 MED ORDER — SODIUM CHLORIDE 0.45 % IV BOLUS
500.0000 mL | Freq: Once | INTRAVENOUS | Status: AC
  Administered 2016-03-23: 500 mL via INTRAVENOUS

## 2016-03-23 MED ORDER — FOLIC ACID 5 MG/ML IJ SOLN
1.0000 mg | Freq: Every day | INTRAMUSCULAR | Status: DC
  Administered 2016-03-24: 1 mg via INTRAVENOUS
  Filled 2016-03-23: qty 0.2

## 2016-03-23 MED ORDER — VALPROATE SODIUM 500 MG/5ML IV SOLN
1000.0000 mg | Freq: Once | INTRAVENOUS | Status: AC
  Administered 2016-03-23: 1000 mg via INTRAVENOUS
  Filled 2016-03-23 (×2): qty 10

## 2016-03-23 NOTE — Progress Notes (Addendum)
INFECTIOUS DISEASE PROGRESS NOTE  ID: Dale [name] is a 50 y.o. male with  Active Problems:   Hypothermia   Severe sepsis (HCC)   Meningitis, pneumococcal, recurrent   Systolic and diastolic CHF, chronic (HCC)   Acute renal failure (HCC)   Hypernatremia  Subjective: Now seizing --> sedated with benzo  Abtx:  Anti-infectives    Start     Dose/Rate Route Frequency Ordered Stop   03/21/16 1000  vancomycin (VANCOCIN) 500 mg in sodium chloride 0.9 % 100 mL IVPB     500 mg 100 mL/hr over 60 Minutes Intravenous Every 12 hours 03/20/16 1940     03/21/16 0800  cefTRIAXone (ROCEPHIN) 2 g in dextrose 5 % 50 mL IVPB     2 g 100 mL/hr over 30 Minutes Intravenous Every 12 hours 03/21/16 0040     03/20/16 2000  vancomycin (VANCOCIN) IVPB 1000 mg/200 mL premix     1,000 mg 200 mL/hr over 60 Minutes Intravenous NOW 03/20/16 1940 03/20/16 2103   03/20/16 1930  cefTRIAXone (ROCEPHIN) 2 g in dextrose 5 % 50 mL IVPB     2 g 100 mL/hr over 30 Minutes Intravenous  Once 03/20/16 1919 03/20/16 2030      Medications:  Scheduled: . antiseptic oral rinse  7 mL Mouth Rinse q12n4p  . cefTRIAXone (ROCEPHIN)  IV  2 g Intravenous Q12H  . chlorhexidine  15 mL Mouth Rinse BID  . Chlorhexidine Gluconate Cloth  6 each Topical Q0600  . enoxaparin (LOVENOX) injection  40 mg Subcutaneous Q24H  . famotidine (PEPCID) IV  20 mg Intravenous Q24H  . insulin aspart  0-9 Units Subcutaneous 6 times per day  . levETIRAcetam  500 mg Intravenous Q12H  . methylPREDNISolone (SOLU-MEDROL) injection  125 mg Intravenous 3 times per day  . mupirocin ointment  1 application Nasal BID  . valproate sodium  1,000 mg Intravenous Once  . valproate sodium  500 mg Intravenous Q8H  . vancomycin  500 mg Intravenous Q12H    Objective: Vital signs in last 24 hours: Temp:  [97.2 F (36.2 C)-97.8 F (36.6 C)] 97.8 F (36.6 C) (04/19 0810) Pulse Rate:  [67-81] 81 (04/19 0810) Resp:  [11-18] 13 (04/19 0810) BP:  (96-112)/(76-83) 104/77 mmHg (04/19 0810) SpO2:  [99 %-100 %] 100 % (04/19 0810) Weight:  [61.5 kg (135 lb 9.3 oz)] 61.5 kg (135 lb 9.3 oz) (04/19 0500)   General appearance: fatigued Resp: clear to auscultation bilaterally Cardio: regular rate and rhythm GI: normal findings: bowel sounds normal and soft, non-tender  Lab Results  Recent Labs  03/22/16 0443 03/23/16 0322  WBC 5.3 8.8  HGB 10.7* 10.8*  HCT 32.2* 34.0*  NA 150* 148*  K 4.2 4.3  CL 117* 117*  CO2 23 18*  BUN 16 20  CREATININE 1.67* 1.83*   Liver Panel  Recent Labs  03/20/16 1800 03/23/16 0322  PROT 6.8 6.2*  ALBUMIN 2.9* 2.1*  AST 28 19  ALT 40 32  ALKPHOS 69 62  BILITOT 0.5 0.3   Sedimentation Rate No results for input(s): ESRSEDRATE in the last 72 hours. C-Reactive Protein No results for input(s): CRP in the last 72 hours.  Microbiology: Recent Results (from the past 240 hour(s))  Blood culture (routine x 2)     Status: None (Preliminary result)   Collection Time: 03/20/16  6:00 PM  Result Value Ref Range Status   Specimen Description BLOOD RIGHT ARM  Final   Special Requests BOTTLES DRAWN AEROBIC  AND ANAEROBIC 5CC  Final   Culture NO GROWTH 2 DAYS  Final   Report Status PENDING  Incomplete  Blood culture (routine x 2)     Status: None (Preliminary result)   Collection Time: 03/20/16  6:10 PM  Result Value Ref Range Status   Specimen Description BLOOD RIGHT HAND  Final   Special Requests BOTTLES DRAWN AEROBIC AND ANAEROBIC 5CC  Final   Culture NO GROWTH 2 DAYS  Final   Report Status PENDING  Incomplete  CSF culture     Status: None (Preliminary result)   Collection Time: 03/20/16 10:12 PM  Result Value Ref Range Status   Specimen Description CSF  Final   Special Requests TUBE 2  2CC  Final   Gram Stain   Final    CYTOSPIN SLIDE WBC PRESENT,BOTH PMN AND MONONUCLEAR NO ORGANISMS SEEN    Culture NO GROWTH 2 DAYS  Final   Report Status PENDING  Incomplete  MRSA PCR Screening      Status: Abnormal   Collection Time: 03/20/16 11:00 PM  Result Value Ref Range Status   MRSA by PCR POSITIVE (A) NEGATIVE Final    Comment:        The GeneXpert MRSA Assay (FDA approved for NASAL specimens only), is one component of a comprehensive MRSA colonization surveillance program. It is not intended to diagnose MRSA infection nor to guide or monitor treatment for MRSA infections. RESULT CALLED TO, READ BACK BY AND VERIFIED WITH: DAVIS,E RN 0214 03/21/16 MITCHELL,L     Studies/Results: Ct Angio Head W/cm &/or Wo Cm  03/22/2016  CLINICAL DATA:  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 Now with multiple basal ganglia infarcts. EXAM: CT ANGIOGRAPHY HEAD AND NECK TECHNIQUE: Multidetector CT imaging of the head and neck was performed using the standard protocol during bolus administration of intravenous contrast. Multiplanar CT image reconstructions and MIPs were obtained to evaluate the vascular anatomy. Carotid stenosis measurements (when applicable) are obtained utilizing NASCET criteria, using the distal internal carotid diameter as the denominator. CONTRAST:  50 mL Isovue 370 IV COMPARISON:  MRI 03/21/2016 FINDINGS: CT HEAD Brain: Generalized atrophy. Multiple areas of enhancement in the basal ganglia bilaterally best seen on MRI. Patchy low-density areas are noted in the basal ganglia bilaterally which may be due to infarction. No acute hemorrhage or mass lesion. No shift of the midline structures. Calvarium and skull base: Negative Paranasal sinuses: Mucosal edema left maxillary sinus. Mild mucosal edema sphenoid sinus. Orbits: Negative CTA NECK Aortic arch: Normal aortic arch. Proximal great vessels widely patent. Bovine branching pattern. Right carotid system: Right common carotid artery widely patent. Right carotid bifurcation widely patent Left carotid system: Left common carotid artery widely patent. Left carotid bifurcation widely patent. Vertebral arteries:Both vertebral arteries widely  patent without significant stenosis. Skeleton: Negative Other neck: Negative CTA HEAD Anterior circulation: Cavernous carotid is patent bilaterally. Anterior and middle cerebral arteries are patent bilaterally. No occluded segments. The A1 and M1 segments are smaller in caliber wall caliber than expected. This could be due to basilar meningitis or spasm. MCA branches appear patent bilateral without significant irregularity to suggest vasculitis. Posterior circulation: Both vertebral arteries patent to the basilar. PICA patent bilaterally. Basilar widely patent. Superior cerebellar and posterior cerebral arteries patent bilaterally without stenosis. Venous sinuses: Negative Anatomic variants: Negative Delayed phase: Subtle areas of enhancement in the left basal ganglia as noted on MRI. No definite abnormal enhancement in the right basal ganglia. Small area of cortical enhancement in the right parietal cortex. These areas  are most consistent with subacute infarction. IMPRESSION: Circle of Willis patent without focal stenosis or branch occlusion. Anterior and middle cerebral arteries show diffuse narrowing which may be due to basilar meningitis or vasospasm. This is a subjective findings and is diffuse. Negative CTA neck Subtle enhancement in the left basal ganglia and right parietal cortex corresponding to areas of abnormal enhancement on the MRI. 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 Electronically Signed   By: Marlan Palau M.D.   On: 03/22/2016 12:24   Ct Angio Neck W/cm &/or Wo/cm  03/22/2016  CLINICAL DATA:  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 Now with multiple basal ganglia infarcts. EXAM: CT ANGIOGRAPHY HEAD AND NECK TECHNIQUE: Multidetector CT imaging of the head and neck was performed using the standard protocol during bolus administration of intravenous contrast. Multiplanar CT image reconstructions  and MIPs were obtained to evaluate the vascular anatomy. Carotid stenosis measurements (when applicable) are obtained utilizing NASCET criteria, using the distal internal carotid diameter as the denominator. CONTRAST:  50 mL Isovue 370 IV COMPARISON:  MRI 03/21/2016 FINDINGS: CT HEAD Brain: Generalized atrophy. Multiple areas of enhancement in the basal ganglia bilaterally best seen on MRI. Patchy low-density areas are noted in the basal ganglia bilaterally which may be due to infarction. No acute hemorrhage or mass lesion. No shift of the midline structures. Calvarium and skull base: Negative Paranasal sinuses: Mucosal edema left maxillary sinus. Mild mucosal edema sphenoid sinus. Orbits: Negative CTA NECK Aortic arch: Normal aortic arch. Proximal great vessels widely patent. Bovine branching pattern. Right carotid system: Right common carotid artery widely patent. Right carotid bifurcation widely patent Left carotid system: Left common carotid artery widely patent. Left carotid bifurcation widely patent. Vertebral arteries:Both vertebral arteries widely patent without significant stenosis. Skeleton: Negative Other neck: Negative CTA HEAD Anterior circulation: Cavernous carotid is patent bilaterally. Anterior and middle cerebral arteries are patent bilaterally. No occluded segments. The A1 and M1 segments are smaller in caliber wall caliber than expected. This could be due to basilar meningitis or spasm. MCA branches appear patent bilateral without significant irregularity to suggest vasculitis. Posterior circulation: Both vertebral arteries patent to the basilar. PICA patent bilaterally. Basilar widely patent. Superior cerebellar and posterior cerebral arteries patent bilaterally without stenosis. Venous sinuses: Negative Anatomic variants: Negative Delayed phase: Subtle areas of enhancement in the left basal ganglia as noted on MRI. No definite abnormal enhancement in the right basal ganglia. Small area of  cortical enhancement in the right parietal cortex. These areas are most consistent with subacute infarction. IMPRESSION: Circle of Willis patent without focal stenosis or branch occlusion. Anterior and middle cerebral arteries show diffuse narrowing which may be due to basilar meningitis or vasospasm. This is a subjective findings and is diffuse. Negative CTA neck Subtle enhancement in the left basal ganglia and right parietal cortex corresponding to areas of abnormal enhancement on the MRI. 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 Electronically Signed   By: Marlan Palau M.D.   On: 03/22/2016 12:24   Mr Laqueta Jean ZO Contrast  03/21/2016  CLINICAL DATA:  Found on floor. 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 EXAM: MRI HEAD WITHOUT AND WITH CONTRAST TECHNIQUE: Multiplanar, multiecho pulse sequences of the brain and surrounding structures were obtained without and with intravenous contrast. CONTRAST:  13mL MULTIHANCE GADOBENATE DIMEGLUMINE 529 MG/ML IV SOLN COMPARISON:  CT head 03/20/2016 FINDINGS: Image quality degraded by  motion. Postcontrast images have significant motion. Edema in the basal ganglia bilaterally left greater than right as noted on CT. There is enhancement in the basal ganglia bilaterally. There is abnormal enhancement in the external capsule on the left, rim enhancing enhancement in the left internal capsule and medial basal ganglia. Abnormal enhancement in the left caudate. There is enhancement in the right caudate and right putamen. Many of these areas are within vascular distribution suggesting ischemia. Small area of enhancement in the right parietal cortex could also be an area of subacute infarct. Diffusion-weighted imaging shows restricted diffusion in the left putamen and caudate and possibly in the external capsule on the left. These are most likely areas of subacute infarct.  Restricted diffusion also noted in the right parietal cortex where there is abnormal enhancement. Right basal ganglia does not show significant restricted diffusion but does show T2 shine through. Ventricle size normal. No shift of the midline structures. Negative for intracranial hemorrhage. IMPRESSION: Image quality degraded by significant motion. There is extensive abnormal enhancement in the basal ganglia bilaterally. Some of these areas show restricted diffusion left basal ganglia. The enhancement pattern and vascular territory suggest the enhancement is related to subacute infarct related to meningitis 1 month ago. Similar area of enhancement and restricted diffusion in the right parietal cortex. No intracranial hemorrhage. Electronically Signed   By: Marlan Palauharles  Clark M.D.   On: 03/21/2016 19:06     Assessment/Plan: Recurrent meningitis ?brain abscess Worsening Cr  Will continue his broad anbx His cx are unrevealing  Await total complement Again, no clear way to assess for CSF leak with his inability to communicate His ears were ceruminous, no gross fluid on exam yesterday.   Total days of antibiotics: 3 vanco/ceftriaxone         Johny SaxJeffrey Hatcher Infectious Diseases (pager) [phone] [email or web address] 03/23/2016, 9:13 AM  LOS: 3 days

## 2016-03-23 NOTE — Progress Notes (Addendum)
Interval History:                                                                                                                      Dale [name] is an 50 y.o. male patient with STREPTOCOCCUS PNEUMONIAE meningoencephalitis, possibly representing a combination of septic arterial cerebral emboli showing as subacute infarcts, possible vasculitis, and possible parenchymal infection with microabscesses and inflammatory changes. CTA shows no significant change but does show anterior and middle cerebral arteries with diffuse narrowing which may be due to basilar meningitis or vasospasm but stated to be a subjective finding.  TEE pending. Formal echo EF 45-50% with diffuse hypokinesis with no vegetation. Keppra and Solumedrol have been started.   Currently he has left gaze deviation with turning of his head to the left when stimulated. No obvious TC activity. Depakote load started now. EEG Pending.    Past Medical History: Past Medical History  Diagnosis Date  . Depression   . Cardiac arrest (HCC)   . Cardiogenic shock (HCC)   . Acute respiratory failure with hypoxia (HCC)   . Meningitis, streptococcal 02/16/2016  . 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 Tobacco abuse 02/16/2016  . Substance abuse   . ETOH abuse   . Septic shock [facility])     Past Surgical History  Procedure Laterality Date  . Ankle surgery    . Cardiac catheterization N/A 02/17/2016    Procedure: IABP Insertion;  Surgeon: Iran Ouch, MD;  Location: [facility] INVASIVE CV LAB;  Service: Cardiovascular;  Laterality: N/A;    Family History: History reviewed. No pertinent family history.  Social History:   reports that he has been smoking Cigarettes.  He has been smoking about 0.10 packs per day. He does not have any smokeless tobacco history on file. He reports that he drinks alcohol. He reports that he uses illicit drugs (Marijuana).  Allergies:  No Known Allergies   Medications:                                                                                                                          Current facility-administered medications:  .  0.9 %  sodium chloride infusion, 250 mL, Intravenous, PRN, Orville Govern, MD .  antiseptic oral rinse (CPC / CETYLPYRIDINIUM CHLORIDE 0.05%) solution 7 mL, 7 mL, Mouth Rinse, q12n4p, Drema Dallas, MD, 7 mL at 03/22/16 1600 .  cefTRIAXone (ROCEPHIN) 2 g in dextrose 5 % 50 mL IVPB, 2 g,  Intravenous, Q12H, Stevphen Rochester, RPH, 2 g at 03/23/16 1610 .  chlorhexidine (PERIDEX) 0.12 % solution 15 mL, 15 mL, Mouth Rinse, BID, Drema Dallas, MD, 15 mL at 03/23/16 0028 .  Chlorhexidine Gluconate Cloth 2 % PADS 6 each, 6 each, Topical, Q0600, Chilton Greathouse, MD, 6 each at 03/22/16 1000 .  dextrose 5 %-0.45 % sodium chloride infusion, , Intravenous, Continuous, Cyril Mourning V, MD, Last Rate: 75 mL/hr at 03/22/16 1125 .  enoxaparin (LOVENOX) injection 40 mg, 40 mg, Subcutaneous, Q24H, Orville Govern, MD, 40 mg at 03/23/16 0028 .  famotidine (PEPCID) IVPB 20 mg premix, 20 mg, Intravenous, Q24H, Ram Daniel Nones, MD, 20 mg at 03/22/16 0857 .  insulin aspart (novoLOG) injection 0-9 Units, 0-9 Units, Subcutaneous, 6 times per day, Chilton Greathouse, MD, 1 Units at 03/23/16 0400 .  levETIRAcetam (KEPPRA) 500 mg in sodium chloride 0.9 % 100 mL IVPB, 500 mg, Intravenous, Q12H, Ram Daniel Nones, MD, 500 mg at 03/23/16 0029 .  methylPREDNISolone sodium succinate (SOLU-MEDROL) 125 mg/2 mL injection 125 mg, 125 mg, Intravenous, 3 times per day, Ram Daniel Nones, MD, 125 mg at 03/23/16 0600 .  mupirocin ointment (BACTROBAN) 2 % 1 application, 1 application, Nasal, BID, Praveen Mannam, MD, 1 application at 03/23/16 0028 .  valproate (DEPACON) 1,000 mg in dextrose 5 % 50 mL IVPB, 1,000 mg, Intravenous, Once, Ram Daniel Nones, MD, 1,000 mg at 03/23/16 0916 .  valproate (DEPACON) 500 mg in dextrose 5 % 50 mL IVPB, 500 mg, Intravenous, Q8H, Ram Daniel Nones, MD .  vancomycin (VANCOCIN) 500 mg in sodium chloride 0.9 % 100 mL IVPB, 500 mg, Intravenous, Q12H, Alvira Monday, MD, 500 mg at 03/23/16 0027   Neurologic Examination:                                                                                                     Today's Vitals   03/23/16 0330 03/23/16 0333 03/23/16 0500 03/23/16 0810  BP: 96/76   104/77  Pulse: 80   81  Temp:  97.5 F (36.4 C)  97.8 F (36.6 C)  TempSrc:  Axillary  Oral  Resp: 11   13  Height:      Weight:   61.5 kg (135 lb 9.3 oz)   SpO2: 99%   100%    Mental Status: Follows no commands but localizes centrally to sternal rub Cranial Nerves: II: No blinks to threat bilaterally, pupils equal, round, reactive to light and accommodation III,IV, VI: eye lids shut , left gaze preference V,VII: smile symmetric, facial light touch sensation normal bilaterally VIII: no reaction to voice IX,X: uvula unable to visualize XI: bilateral shoulder shrug XII: midline tongue extension Motor: Extremities with minimal increased tone throughout Sensory: only responds to sternal rub  Deep Tendon Reflexes: 1+ and symmetric throughout Plantars: Mute bilaterally Cerebellar: Unable to obtain Gait: not tested    Lab Results: Basic Metabolic Panel:  Recent Labs Lab 03/20/16 1800 03/21/16 0322 03/22/16 0443 03/23/16 0322  NA 146* 150* 150* 148*  K 4.2 4.3  4.2 4.3  CL 109 120* 117* 117*  CO2 25 21* 23 18*  GLUCOSE 87 59* 81 124*  BUN 22* 24* 16 20  CREATININE 1.43* 1.39* 1.67* 1.83*  CALCIUM 9.3 8.0* 8.7* 8.8*  MG  --  1.7  --  1.8  PHOS  --  4.1  --   --     Liver Function Tests:  Recent Labs Lab 03/20/16 1800 03/23/16 0322  AST 28 19  ALT 40 32  ALKPHOS 69 62  BILITOT 0.5 0.3  PROT 6.8 6.2*  ALBUMIN 2.9* 2.1*   No results for input(s): LIPASE, AMYLASE in the last 168 hours. No results for input(s): AMMONIA in the last 168 hours.  CBC:  Recent Labs Lab 03/20/16 1800  03/21/16 0322 03/22/16 0443 03/23/16 0322  WBC 5.6 6.2 5.3 8.8  NEUTROABS 3.1  --   --  7.3  HGB 11.5* 10.3* 10.7* 10.8*  HCT 35.9* 32.8* 32.2* 34.0*  MCV 92.8 91.9 92.0 89.7  PLT 336 307 243 275    Cardiac Enzymes:  Recent Labs Lab 03/21/16 0322 03/21/16 1240  TROPONINI <0.03 <0.03    Lipid Panel: No results for input(s): CHOL, TRIG, HDL, CHOLHDL, VLDL, LDLCALC in the last 168 hours.  CBG:  Recent Labs Lab 03/22/16 1457 03/22/16 1955 03/22/16 2345 03/23/16 0327 03/23/16 0849  GLUCAP 122* 128* 123* 128* 116*    Microbiology: Results for orders placed or performed during the hospital encounter of 03/20/16  Blood culture (routine x 2)     Status: None (Preliminary result)   Collection Time: 03/20/16  6:00 PM  Result Value Ref Range Status   Specimen Description BLOOD RIGHT ARM  Final   Special Requests BOTTLES DRAWN AEROBIC AND ANAEROBIC 5CC  Final   Culture NO GROWTH 2 DAYS  Final   Report Status PENDING  Incomplete  Blood culture (routine x 2)     Status: None (Preliminary result)   Collection Time: 03/20/16  6:10 PM  Result Value Ref Range Status   Specimen Description BLOOD RIGHT HAND  Final   Special Requests BOTTLES DRAWN AEROBIC AND ANAEROBIC 5CC  Final   Culture NO GROWTH 2 DAYS  Final   Report Status PENDING  Incomplete  CSF culture     Status: None (Preliminary result)   Collection Time: 03/20/16 10:12 PM  Result Value Ref Range Status   Specimen Description CSF  Final   Special Requests TUBE 2  2CC  Final   Gram Stain   Final    CYTOSPIN SLIDE WBC PRESENT,BOTH PMN AND MONONUCLEAR NO ORGANISMS SEEN    Culture NO GROWTH 2 DAYS  Final   Report Status PENDING  Incomplete  MRSA PCR Screening     Status: Abnormal   Collection Time: 03/20/16 11:00 PM  Result Value Ref Range Status   MRSA by PCR POSITIVE (A) NEGATIVE Final    Comment:        The GeneXpert MRSA Assay (FDA approved for NASAL specimens only), is one component of a comprehensive  MRSA colonization surveillance program. It is not intended to diagnose MRSA infection nor to guide or monitor treatment for MRSA infections. RESULT CALLED TO, READ BACK BY AND VERIFIED WITH: DAVIS,E RN 0214 03/21/16 MITCHELL,L     Imaging: Ct Angio Head W/cm &/or Wo Cm  03/22/2016  CLINICAL DATA:  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 Now with multiple basal ganglia infarcts. EXAM: CT ANGIOGRAPHY HEAD AND NECK TECHNIQUE: Multidetector CT imaging of the head and neck was performed using  the standard protocol during bolus administration of intravenous contrast. Multiplanar CT image reconstructions and MIPs were obtained to evaluate the vascular anatomy. Carotid stenosis measurements (when applicable) are obtained utilizing NASCET criteria, using the distal internal carotid diameter as the denominator. CONTRAST:  50 mL Isovue 370 IV COMPARISON:  MRI 03/21/2016 FINDINGS: CT HEAD Brain: Generalized atrophy. Multiple areas of enhancement in the basal ganglia bilaterally best seen on MRI. Patchy low-density areas are noted in the basal ganglia bilaterally which may be due to infarction. No acute hemorrhage or mass lesion. No shift of the midline structures. Calvarium and skull base: Negative Paranasal sinuses: Mucosal edema left maxillary sinus. Mild mucosal edema sphenoid sinus. Orbits: Negative CTA NECK Aortic arch: Normal aortic arch. Proximal great vessels widely patent. Bovine branching pattern. Right carotid system: Right common carotid artery widely patent. Right carotid bifurcation widely patent Left carotid system: Left common carotid artery widely patent. Left carotid bifurcation widely patent. Vertebral arteries:Both vertebral arteries widely patent without significant stenosis. Skeleton: Negative Other neck: Negative CTA HEAD Anterior circulation: Cavernous carotid is patent bilaterally. Anterior and middle cerebral arteries are patent bilaterally. No occluded segments. The A1 and M1 segments are  smaller in caliber wall caliber than expected. This could be due to basilar meningitis or spasm. MCA branches appear patent bilateral without significant irregularity to suggest vasculitis. Posterior circulation: Both vertebral arteries patent to the basilar. PICA patent bilaterally. Basilar widely patent. Superior cerebellar and posterior cerebral arteries patent bilaterally without stenosis. Venous sinuses: Negative Anatomic variants: Negative Delayed phase: Subtle areas of enhancement in the left basal ganglia as noted on MRI. No definite abnormal enhancement in the right basal ganglia. Small area of cortical enhancement in the right parietal cortex. These areas are most consistent with subacute infarction. IMPRESSION: Circle of Willis patent without focal stenosis or branch occlusion. Anterior and middle cerebral arteries show diffuse narrowing which may be due to basilar meningitis or vasospasm. This is a subjective findings and is diffuse. Negative CTA neck Subtle enhancement in the left basal ganglia and right parietal cortex corresponding to areas of abnormal enhancement on the MRI. 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 Electronically Signed   By: Marlan Palau M.D.   On: 03/22/2016 12:24   Ct Head Wo Contrast  03/20/2016  CLINICAL DATA:  Patient status post witnessed fall. Abrasion to the left periorbital soft tissues. EXAM: CT HEAD WITHOUT CONTRAST CT CERVICAL SPINE WITHOUT CONTRAST TECHNIQUE: Multidetector CT imaging of the head and cervical spine was performed following the standard protocol without intravenous contrast. Multiplanar CT image reconstructions of the cervical spine were also generated. COMPARISON:  Brain CT 02/17/2016; 02/16/2016. FINDINGS: CT HEAD FINDINGS Ventricles and sulci are appropriate for patient's age. Interval development of left-greater-than-right basal ganglia  hypodensities. No evidence for acute intracranial hemorrhage, mass lesion or mass-effect. Orbits are unremarkable. Paranasal sinuses are well aerated. Mucosal thickening left maxillary sinus. Mastoid air cells are unremarkable. Calvarium is intact. CT CERVICAL SPINE FINDINGS Normal anatomic alignment. No evidence for acute cervical spine fracture. Craniocervical junction is intact. Preservation of the vertebral body and intervertebral disc space heights. 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 Lung apices are clear. Thyroid is unremarkable. Prevertebral soft tissues unremarkable. IMPRESSION: Interval development of hypodensities within the left-greater-than-right basal ganglia which may represent subacute infarcts. No acute cervical spine fracture. Electronically Signed   By: Annia Belt M.D.   On: 03/20/2016 19:15   Ct Angio Neck  W/cm &/or Wo/cm  03/22/2016  CLINICAL DATA:  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 Now with multiple basal ganglia infarcts. EXAM: CT ANGIOGRAPHY HEAD AND NECK TECHNIQUE: Multidetector CT imaging of the head and neck was performed using the standard protocol during bolus administration of intravenous contrast. Multiplanar CT image reconstructions and MIPs were obtained to evaluate the vascular anatomy. Carotid stenosis measurements (when applicable) are obtained utilizing NASCET criteria, using the distal internal carotid diameter as the denominator. CONTRAST:  50 mL Isovue 370 IV COMPARISON:  MRI 03/21/2016 FINDINGS: CT HEAD Brain: Generalized atrophy. Multiple areas of enhancement in the basal ganglia bilaterally best seen on MRI. Patchy low-density areas are noted in the basal ganglia bilaterally which may be due to infarction. No acute hemorrhage or mass lesion. No shift of the midline structures. Calvarium and skull base: Negative Paranasal sinuses: Mucosal edema left maxillary sinus. Mild mucosal edema sphenoid sinus. Orbits: Negative CTA NECK Aortic arch: Normal aortic arch.  Proximal great vessels widely patent. Bovine branching pattern. Right carotid system: Right common carotid artery widely patent. Right carotid bifurcation widely patent Left carotid system: Left common carotid artery widely patent. Left carotid bifurcation widely patent. Vertebral arteries:Both vertebral arteries widely patent without significant stenosis. Skeleton: Negative Other neck: Negative CTA HEAD Anterior circulation: Cavernous carotid is patent bilaterally. Anterior and middle cerebral arteries are patent bilaterally. No occluded segments. The A1 and M1 segments are smaller in caliber wall caliber than expected. This could be due to basilar meningitis or spasm. MCA branches appear patent bilateral without significant irregularity to suggest vasculitis. Posterior circulation: Both vertebral arteries patent to the basilar. PICA patent bilaterally. Basilar widely patent. Superior cerebellar and posterior cerebral arteries patent bilaterally without stenosis. Venous sinuses: Negative Anatomic variants: Negative Delayed phase: Subtle areas of enhancement in the left basal ganglia as noted on MRI. No definite abnormal enhancement in the right basal ganglia. Small area of cortical enhancement in the right parietal cortex. These areas are most consistent with subacute infarction. IMPRESSION: Circle of Willis patent without focal stenosis or branch occlusion. Anterior and middle cerebral arteries show diffuse narrowing which may be due to basilar meningitis or vasospasm. This is a subjective findings and is diffuse. Negative CTA neck Subtle enhancement in the left basal ganglia and right parietal cortex corresponding to areas of abnormal enhancement on the MRI. 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 Electronically Signed   By: Marlan Palau M.D.   On: 03/22/2016 12:24   Ct Cervical Spine Wo  Contrast  03/20/2016  CLINICAL DATA:  Patient status post witnessed fall. Abrasion to the left periorbital soft tissues. EXAM: CT HEAD WITHOUT CONTRAST CT CERVICAL SPINE WITHOUT CONTRAST TECHNIQUE: Multidetector CT imaging of the head and cervical spine was performed following the standard protocol without intravenous contrast. Multiplanar CT image reconstructions of the cervical spine were also generated. COMPARISON:  Brain CT 02/17/2016; 02/16/2016. FINDINGS: CT HEAD FINDINGS Ventricles and sulci are appropriate for patient's age. Interval development of left-greater-than-right basal ganglia hypodensities. No evidence for acute intracranial hemorrhage, mass lesion or mass-effect. Orbits are unremarkable. Paranasal sinuses are well aerated. Mucosal thickening left maxillary sinus. Mastoid air cells are unremarkable. Calvarium is intact. CT CERVICAL SPINE FINDINGS Normal anatomic alignment. No evidence for acute cervical spine fracture. Craniocervical junction is intact. Preservation of the vertebral body and intervertebral disc space heights. 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 Lung apices are clear. Thyroid is unremarkable. Prevertebral soft tissues unremarkable. IMPRESSION: Interval  development of hypodensities within the left-greater-than-right basal ganglia which may represent subacute infarcts. No acute cervical spine fracture. Electronically Signed   By: Annia Belt M.D.   On: 03/20/2016 19:15   Mr Laqueta Jean ZO Contrast  03/21/2016  CLINICAL DATA:  Found on floor. 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 EXAM: MRI HEAD WITHOUT AND WITH CONTRAST TECHNIQUE: Multiplanar, multiecho pulse sequences of the brain and surrounding structures were obtained without and with intravenous contrast. CONTRAST:  13mL MULTIHANCE GADOBENATE DIMEGLUMINE 529 MG/ML IV SOLN COMPARISON:  CT head 03/20/2016 FINDINGS: Image quality degraded by motion. Postcontrast images have significant motion. Edema in the basal  ganglia bilaterally left greater than right as noted on CT. There is enhancement in the basal ganglia bilaterally. There is abnormal enhancement in the external capsule on the left, rim enhancing enhancement in the left internal capsule and medial basal ganglia. Abnormal enhancement in the left caudate. There is enhancement in the right caudate and right putamen. Many of these areas are within vascular distribution suggesting ischemia. Small area of enhancement in the right parietal cortex could also be an area of subacute infarct. Diffusion-weighted imaging shows restricted diffusion in the left putamen and caudate and possibly in the external capsule on the left. These are most likely areas of subacute infarct. Restricted diffusion also noted in the right parietal cortex where there is abnormal enhancement. Right basal ganglia does not show significant restricted diffusion but does show T2 shine through. Ventricle size normal. No shift of the midline structures. Negative for intracranial hemorrhage. IMPRESSION: Image quality degraded by significant motion. There is extensive abnormal enhancement in the basal ganglia bilaterally. Some of these areas show restricted diffusion left basal ganglia. The enhancement pattern and vascular territory suggest the enhancement is related to subacute infarct related to meningitis 1 month ago. Similar area of enhancement and restricted diffusion in the right parietal cortex. No intracranial hemorrhage. Electronically Signed   By: Marlan Palau M.D.   On: 03/21/2016 19:06   Dg Chest Portable 1 View  03/20/2016  CLINICAL DATA:  Recent fall with altered mental status, initial encounter EXAM: PORTABLE CHEST 1 VIEW COMPARISON:  03/05/2016 FINDINGS: The heart size and mediastinal contours are within normal limits. Both lungs are clear. The visualized skeletal structures are unremarkable. IMPRESSION: No active disease. Electronically Signed   By: Alcide Clever M.D.   On: 03/20/2016  18:16    Will be seen by Dr. Lavon Paganini. Please see his attestation note for A/P for any additional work up recommendations.    Assessment and plan:   Dale [name] is an 50 y.o. male patient with recurrent meningitis--ID following. Now presenting with seizure. Currently on Keppra 500 BID and Depakote 500 TID and now will receive a Depakote load of 1G.  Will have EEG placed and make further recommendations based on EEG.

## 2016-03-23 NOTE — Progress Notes (Signed)
LTM EEG started 

## 2016-03-23 NOTE — Progress Notes (Addendum)
MD notified of patients neuro status. Pt eyes deviated to left including head roll to right and left side. Pupils brisk and equal at 2mm. Pt minimally responsive to painful stimuli on right side. EEG ordered, one time dose of IV ativan and depacon IV ordered. Will continue to monitor.

## 2016-03-23 NOTE — Progress Notes (Signed)
[facility] TEAM 1 - Stepdown/ICU TEAM PROGRESS NOTE  Dale [name] VZD:[id number] DOB: 06-Feb-1966 DOA: 03/20/2016 PCP: No primary care provider on file.  Admit HPI / Brief Narrative: 50 yo M with hx homelessness, admitted 3-14 to 03-10-16 with streptococcal (CSF Pneumococcus R-PEN) meningitis. His course was complicated by v-fib and respiratory arrest, as well as ARF due to ATN. He received 14 days of ceftriaxone, and weekly IM Pen G (for positive RPR 1:2). He was d/c to SNF. Non-verbal. At SNF his mental status was stable.  He returns on 4-16 after being found down. He was hypothermic. He was started on vanco/ceftriaxone. He had repeat LP showing 400 WBC (75%N). Prot > 600, Glc 37. He had solumedrol added. His CT head suggests subacute infarcts of basal ganglia. MRI also showed these and suggested them as residual from his previous meningitis. (per Dr. Ninetta Lights)  HPI/Subjective: Pt is sedate.  He opens his eyes to sternal rub.  He turns his head to the L, and his eyes are deviated to the L.  He does not follow commands and is not able to speak.  He is in no apparent acute resp distress.    Assessment/Plan:  Severe sepsis - Recurrent meningitis v/s brain abscess v/s SBE -empiric coverage with vanc + rocephin -ID following -will need TEE to r/o SBE when stable  -pneumococcus isolated from his blood and CSF on 02/16/16  Acute encephalopathy / L gaze preference / B basal ganglia abnormality  -multifactorial to include severe sepsis, subacute infarct, brain abscess? -Neuro obtaining f/u MRI, as well as EEG   Chronic Systolic and Diastolic CHF  -EF 45-50% on TTE 4/18 (improved since March) - no evidence of signif volume overload at this time - wgt at d/c 4/11 ~56kg  Filed Weights   03/21/16 0404 03/22/16 0500 03/23/16 0500  Weight: 59 kg (130 lb 1.1 oz) 59.3 kg (130 lb 11.7 oz) 61.5 kg (135 lb 9.3 oz)   Acute renal failure -multifactorial to include sepsis, heart failure, dehydration - crt  climbing, but appears to be close to peaking   Hypernatremia -likely due to true dehydration - cont free water and follow   Hypothermia -appears to have resolved   Malnutrition -CoreTrack for feeding   MRSA Screen +  Code Status: FULL Family Communication: no family present at time of exam Disposition Plan: SDU  Consultants: Neurology  ID  Procedures: 4/18 TTE - EF 45-50%. Diffuse hypokinesis - grade 1 diastolic dysfunction  Antibiotics: Vancomycin 4/16 > Rocephin 4/16 >  DVT prophylaxis: lovenox   Objective: Blood pressure 106/79, pulse 83, temperature 97.5 F (36.4 C), temperature source Axillary, resp. rate 16, height  (1.727 m), weight 61.5 kg (135 lb 9.3 oz), SpO2 99 %.  Intake/Output Summary (Last 24 hours) at 03/23/16 1501 Last data filed at 03/23/16 1300  Gross per 24 hour  Intake 2718.75 ml  Output    875 ml  Net 1843.75 ml   Exam: General: No acute respiratory distress Lungs: Clear to auscultation bilaterally without wheezes or crackles Cardiovascular: Regular rate and rhythm without murmur gallop or rub normal S1 and S2 Abdomen: Nondistended, soft, bowel sounds positive, no rebound, no ascites, no appreciable mass Extremities: No significant cyanosis, clubbing, or edema bilateral lower extremities  Data Reviewed:  Basic Metabolic Panel:  Recent Labs Lab 03/20/16 1800 03/21/16 0322 03/22/16 0443 03/23/16 0322  NA 146* 150* 150* 148*  K 4.2 4.3 4.2 4.3  CL 109 120* 117* 117*  CO2 25 21*  23 18*  GLUCOSE 87 59* 81 124*  BUN 22* 24* 16 20  CREATININE 1.43* 1.39* 1.67* 1.83*  CALCIUM 9.3 8.0* 8.7* 8.8*  MG  --  1.7  --  1.8  PHOS  --  4.1  --   --     CBC:  Recent Labs Lab 03/20/16 1800 03/21/16 0322 03/22/16 0443 03/23/16 0322  WBC 5.6 6.2 5.3 8.8  NEUTROABS 3.1  --   --  7.3  HGB 11.5* 10.3* 10.7* 10.8*  HCT 35.9* 32.8* 32.2* 34.0*  MCV 92.8 91.9 92.0 89.7  PLT 336 307 243 275    Liver Function Tests:  Recent  Labs Lab 03/20/16 1800 03/23/16 0322  AST 28 19  ALT 40 32  ALKPHOS 69 62  BILITOT 0.5 0.3  PROT 6.8 6.2*  ALBUMIN 2.9* 2.1*   No results for input(s): LIPASE, AMYLASE in the last 168 hours. No results for input(s): AMMONIA in the last 168 hours.  Coags: No results for input(s): INR in the last 168 hours.  Invalid input(s): PT No results for input(s): APTT in the last 168 hours.  Cardiac Enzymes:  Recent Labs Lab 03/21/16 0322 03/21/16 1240  TROPONINI <0.03 <0.03    CBG:  Recent Labs Lab 03/22/16 1955 03/22/16 2345 03/23/16 0327 03/23/16 0849 03/23/16 1143  GLUCAP 128* 123* 128* 116* 138*    Recent Results (from the past 240 hour(s))  Blood culture (routine x 2)     Status: None (Preliminary result)   Collection Time: 03/20/16  6:00 PM  Result Value Ref Range Status   Specimen Description BLOOD RIGHT ARM  Final   Special Requests BOTTLES DRAWN AEROBIC AND ANAEROBIC 5CC  Final   Culture NO GROWTH 3 DAYS  Final   Report Status PENDING  Incomplete  Blood culture (routine x 2)     Status: None (Preliminary result)   Collection Time: 03/20/16  6:10 PM  Result Value Ref Range Status   Specimen Description BLOOD RIGHT HAND  Final   Special Requests BOTTLES DRAWN AEROBIC AND ANAEROBIC 5CC  Final   Culture NO GROWTH 3 DAYS  Final   Report Status PENDING  Incomplete  CSF culture     Status: None (Preliminary result)   Collection Time: 03/20/16 10:12 PM  Result Value Ref Range Status   Specimen Description CSF  Final   Special Requests TUBE 2  2CC  Final   Gram Stain   Final    CYTOSPIN SLIDE WBC PRESENT,BOTH PMN AND MONONUCLEAR NO ORGANISMS SEEN    Culture NO GROWTH 3 DAYS  Final   Report Status PENDING  Incomplete  MRSA PCR Screening     Status: Abnormal   Collection Time: 03/20/16 11:00 PM  Result Value Ref Range Status   MRSA by PCR POSITIVE (A) NEGATIVE Final    Comment:        The GeneXpert MRSA Assay (FDA approved for NASAL specimens only), is  one component of a comprehensive MRSA colonization surveillance program. It is not intended to diagnose MRSA infection nor to guide or monitor treatment for MRSA infections. RESULT CALLED TO, READ BACK BY AND VERIFIED WITH: DAVIS,E RN 0214 03/21/16 MITCHELL,L      Studies:   Recent x-ray studies have been reviewed in detail by the Attending Physician  Scheduled Meds:  Scheduled Meds: . antiseptic oral rinse  7 mL Mouth Rinse q12n4p  . cefTRIAXone (ROCEPHIN)  IV  2 g Intravenous Q12H  . chlorhexidine  15 mL Mouth Rinse  BID  . Chlorhexidine Gluconate Cloth  6 each Topical Q0600  . enoxaparin (LOVENOX) injection  40 mg Subcutaneous Q24H  . famotidine (PEPCID) IV  20 mg Intravenous Q24H  . insulin aspart  0-9 Units Subcutaneous 6 times per day  . levETIRAcetam  500 mg Intravenous Q12H  . methylPREDNISolone (SOLU-MEDROL) injection  125 mg Intravenous 3 times per day  . mupirocin ointment  1 application Nasal BID  . valproate sodium  500 mg Intravenous Q8H  . vancomycin  500 mg Intravenous Q12H    Time spent on care of this patient: 35 mins   MCCLUNG,JEFFREY T , MD   [facility] Office  [phone] Pager - Text Page per Loretha Stapler as per below:  On-Call/Text Page:      [email or web address]      password [email or web address]  If 7PM-7AM, please contact night-coverage [email or web address] Password [email or web address] 03/23/2016, 3:01 PM   LOS: 3 days

## 2016-03-23 NOTE — Care Management Note (Signed)
Case Management Note  Patient Details  Name: Dale [name] MRN: [id number] Date of Birth: 03/31/1966  Subjective/Objective:    [facility].                 Action/Plan:  Recurrent meningitis ?brain abscess Worsening Cr  Will continue his abx   Expected Discharge Date:                  Expected Discharge Plan:  Skilled Nursing Facility  In-House Referral:  Clinical Social Work  Discharge planning Services  CM Consult  Post Acute Care Choice:    Choice offered to:     DME Arranged:    DME Agency:     HH Arranged:    HH Agency:     Status of Service:  In process, will continue to follow  Medicare Important Message Given:    Date Medicare IM Given:    Medicare IM give by:    Date Additional Medicare IM Given:    Additional Medicare Important Message give by:     If discussed at Long Length of Stay Meetings, dates discussed:    Additional Comments:  Leone Havenaylor, Aloura Matsuoka [place], RN 03/23/2016, 3:30 PM

## 2016-03-23 NOTE — Progress Notes (Signed)
Prolonged EEG completed. Removed leads. Some glue still remains in patient's hair. Results pending.

## 2016-03-23 NOTE — Progress Notes (Signed)
Pt's wife at bedside. Update given concerning patients neuro status. Wife would like to speak to an MD regarding results. MD & PA notified. Will continue to monitor.

## 2016-03-24 ENCOUNTER — Inpatient Hospital Stay (HOSPITAL_COMMUNITY): Payer: Medicaid Other

## 2016-03-24 DIAGNOSIS — I639 Cerebral infarction, unspecified: Secondary | ICD-10-CM

## 2016-03-24 DIAGNOSIS — E440 Moderate protein-calorie malnutrition: Secondary | ICD-10-CM | POA: Insufficient documentation

## 2016-03-24 LAB — COMPREHENSIVE METABOLIC PANEL
ALBUMIN: 2.1 g/dL — AB (ref 3.5–5.0)
ALK PHOS: 61 U/L (ref 38–126)
ALT: 30 U/L (ref 17–63)
ANION GAP: 16 — AB (ref 5–15)
AST: 15 U/L (ref 15–41)
BILIRUBIN TOTAL: 0.2 mg/dL — AB (ref 0.3–1.2)
BUN: 22 mg/dL — AB (ref 6–20)
CALCIUM: 8.5 mg/dL — AB (ref 8.9–10.3)
CO2: 17 mmol/L — ABNORMAL LOW (ref 22–32)
CREATININE: 1.66 mg/dL — AB (ref 0.61–1.24)
Chloride: 112 mmol/L — ABNORMAL HIGH (ref 101–111)
GFR calc Af Amer: 54 mL/min — ABNORMAL LOW (ref >60)
GFR calc non Af Amer: 46 mL/min — ABNORMAL LOW (ref >60)
GLUCOSE: 121 mg/dL — AB (ref 65–99)
Potassium: 3.7 mmol/L (ref 3.5–5.1)
Sodium: 145 mmol/L (ref 135–145)
TOTAL PROTEIN: 5.7 g/dL — AB (ref 6.5–8.1)

## 2016-03-24 LAB — CBC WITH DIFFERENTIAL/PLATELET
BASOS ABS: 0 10*3/uL (ref 0.0–0.1)
BASOS PCT: 0 %
EOS PCT: 0 %
Eosinophils Absolute: 0 10*3/uL (ref 0.0–0.7)
HCT: 31.7 % — ABNORMAL LOW (ref 39.0–52.0)
Hemoglobin: 10.2 g/dL — ABNORMAL LOW (ref 13.0–17.0)
Lymphocytes Relative: 10 %
Lymphs Abs: 1.5 10*3/uL (ref 0.7–4.0)
MCH: 28.8 pg (ref 26.0–34.0)
MCHC: 32.2 g/dL (ref 30.0–36.0)
MCV: 89.5 fL (ref 78.0–100.0)
MONO ABS: 0.3 10*3/uL (ref 0.1–1.0)
Monocytes Relative: 2 %
Neutro Abs: 13.1 10*3/uL — ABNORMAL HIGH (ref 1.7–7.7)
Neutrophils Relative %: 88 %
PLATELETS: 263 10*3/uL (ref 150–400)
RBC: 3.54 MIL/uL — ABNORMAL LOW (ref 4.22–5.81)
RDW: 14.7 % (ref 11.5–15.5)
WBC: 14.8 10*3/uL — ABNORMAL HIGH (ref 4.0–10.5)

## 2016-03-24 LAB — CSF CULTURE

## 2016-03-24 LAB — GLUCOSE, CAPILLARY
GLUCOSE-CAPILLARY: 110 mg/dL — AB (ref 65–99)
GLUCOSE-CAPILLARY: 107 mg/dL — AB (ref 65–99)
GLUCOSE-CAPILLARY: 104 mg/dL — AB (ref 65–99)
GLUCOSE-CAPILLARY: 120 mg/dL — AB (ref 65–99)
Glucose-Capillary: 111 mg/dL — ABNORMAL HIGH (ref 65–99)

## 2016-03-24 LAB — RETICULOCYTES
RBC.: 3.54 MIL/uL — AB (ref 4.22–5.81)
RETIC CT PCT: 0.7 % (ref 0.4–3.1)
Retic Count, Absolute: 24.8 10*3/uL (ref 19.0–186.0)

## 2016-03-24 LAB — VITAMIN B12: VITAMIN B 12: 1101 pg/mL — AB (ref 180–914)

## 2016-03-24 LAB — IRON AND TIBC
Iron: 92 ug/dL (ref 45–182)
Saturation Ratios: 41 % — ABNORMAL HIGH (ref 17.9–39.5)
TIBC: 223 ug/dL — ABNORMAL LOW (ref 250–450)
UIBC: 131 ug/dL

## 2016-03-24 LAB — FOLATE: FOLATE: 9.2 ng/mL (ref >5.9)

## 2016-03-24 LAB — CSF CULTURE W GRAM STAIN: Culture: NO GROWTH

## 2016-03-24 LAB — AMMONIA: Ammonia: 43 umol/L — ABNORMAL HIGH (ref 9–35)

## 2016-03-24 LAB — VANCOMYCIN, TROUGH: Vancomycin Tr: 22 ug/mL — ABNORMAL HIGH (ref 10.0–20.0)

## 2016-03-24 LAB — FERRITIN: Ferritin: 412 ng/mL — ABNORMAL HIGH (ref 24–336)

## 2016-03-24 MED ORDER — VANCOMYCIN HCL IN DEXTROSE 1-5 GM/200ML-% IV SOLN
1000.0000 mg | INTRAVENOUS | Status: DC
  Administered 2016-03-24 – 2016-03-26 (×3): 1000 mg via INTRAVENOUS
  Filled 2016-03-24 (×6): qty 200

## 2016-03-24 MED ORDER — FAMOTIDINE 40 MG/5ML PO SUSR
20.0000 mg | Freq: Every day | ORAL | Status: DC
  Administered 2016-03-25 – 2016-04-05 (×11): 20 mg
  Filled 2016-03-24 (×12): qty 2.5

## 2016-03-24 MED ORDER — LORAZEPAM 2 MG/ML IJ SOLN
INTRAMUSCULAR | Status: AC
  Filled 2016-03-24: qty 1

## 2016-03-24 MED ORDER — LORAZEPAM 2 MG/ML IJ SOLN
1.0000 mg | Freq: Once | INTRAMUSCULAR | Status: AC
  Administered 2016-03-24: 1 mg via INTRAVENOUS

## 2016-03-24 MED ORDER — SODIUM CHLORIDE 0.45 % IV SOLN
INTRAVENOUS | Status: DC
  Administered 2016-03-24: 18:00:00 via INTRAVENOUS
  Administered 2016-03-26: 250 mL via INTRAVENOUS
  Administered 2016-03-29: 16:00:00 via INTRAVENOUS

## 2016-03-24 MED ORDER — PRO-STAT SUGAR FREE PO LIQD
30.0000 mL | Freq: Every day | ORAL | Status: DC
  Administered 2016-03-24 – 2016-04-01 (×8): 30 mL
  Filled 2016-03-24 (×9): qty 30

## 2016-03-24 MED ORDER — JEVITY 1.2 CAL PO LIQD
1000.0000 mL | ORAL | Status: DC
  Administered 2016-03-24: 15:00:00
  Administered 2016-03-26 – 2016-03-30 (×2): 1000 mL
  Filled 2016-03-24 (×12): qty 1000
  Filled 2016-03-24: qty 237
  Filled 2016-03-24 (×4): qty 1000

## 2016-03-24 MED ORDER — FOLIC ACID 1 MG PO TABS
1.0000 mg | ORAL_TABLET | Freq: Every day | ORAL | Status: DC
  Administered 2016-03-25 – 2016-04-05 (×11): 1 mg
  Filled 2016-03-24 (×11): qty 1

## 2016-03-24 MED ORDER — VITAMIN B-1 100 MG PO TABS
100.0000 mg | ORAL_TABLET | Freq: Every day | ORAL | Status: DC
  Administered 2016-03-25 – 2016-04-05 (×11): 100 mg
  Filled 2016-03-24 (×11): qty 1

## 2016-03-24 NOTE — Progress Notes (Signed)
INFECTIOUS DISEASE PROGRESS NOTE  ID: Dale [name] is a 50 y.o. male with  Active Problems:   Hypothermia   Severe sepsis (HCC)   Meningitis, pneumococcal, recurrent   Systolic and diastolic CHF, chronic (HCC)   Acute renal failure (HCC)   Hypernatremia  Subjective: Resting quietly  Abtx:  Anti-infectives    Start     Dose/Rate Route Frequency Ordered Stop   03/21/16 1000  vancomycin (VANCOCIN) 500 mg in sodium chloride 0.9 % 100 mL IVPB     500 mg 100 mL/hr over 60 Minutes Intravenous Every 12 hours 03/20/16 1940     03/21/16 0800  cefTRIAXone (ROCEPHIN) 2 g in dextrose 5 % 50 mL IVPB     2 g 100 mL/hr over 30 Minutes Intravenous Every 12 hours 03/21/16 0040     03/20/16 2000  vancomycin (VANCOCIN) IVPB 1000 mg/200 mL premix     1,000 mg 200 mL/hr over 60 Minutes Intravenous NOW 03/20/16 1940 03/20/16 2103   03/20/16 1930  cefTRIAXone (ROCEPHIN) 2 g in dextrose 5 % 50 mL IVPB     2 g 100 mL/hr over 30 Minutes Intravenous  Once 03/20/16 1919 03/20/16 2030      Medications:  Scheduled: . antiseptic oral rinse  7 mL Mouth Rinse q12n4p  . cefTRIAXone (ROCEPHIN)  IV  2 g Intravenous Q12H  . chlorhexidine  15 mL Mouth Rinse BID  . Chlorhexidine Gluconate Cloth  6 each Topical Q0600  . enoxaparin (LOVENOX) injection  40 mg Subcutaneous Q24H  . famotidine (PEPCID) IV  20 mg Intravenous Q24H  . folic acid  1 mg Intravenous Daily  . insulin aspart  0-9 Units Subcutaneous 6 times per day  . levETIRAcetam  500 mg Intravenous Q12H  . methylPREDNISolone (SOLU-MEDROL) injection  125 mg Intravenous 3 times per day  . mupirocin ointment  1 application Nasal BID  . thiamine IV  100 mg Intravenous Daily  . valproate sodium  500 mg Intravenous Q8H  . vancomycin  500 mg Intravenous Q12H    Objective: Vital signs in last 24 hours: Temp:  [97.3 F (36.3 C)-97.7 F (36.5 C)] 97.3 F (36.3 C) (04/20 0800) Pulse Rate:  [64-83] 70 (04/20 0352) Resp:  [9-17] 14 (04/20 0352) BP:  (95-112)/(72-87) 112/87 mmHg (04/20 0352) SpO2:  [97 %-100 %] 100 % (04/20 0352) Weight:  [61 kg (134 lb 7.7 oz)] 61 kg (134 lb 7.7 oz) (04/20 0352)   General appearance: fatigued Resp: clear to auscultation bilaterally Cardio: regular rate and rhythm GI: normal findings: bowel sounds normal and soft, non-tender  Lab Results  Recent Labs  03/23/16 0322 03/24/16 0458  WBC 8.8 14.8*  HGB 10.8* 10.2*  HCT 34.0* 31.7*  NA 148* 145  K 4.3 3.7  CL 117* 112*  CO2 18* 17*  BUN 20 22*  CREATININE 1.83* 1.66*   Liver Panel  Recent Labs  03/23/16 0322 03/24/16 0458  PROT 6.2* 5.7*  ALBUMIN 2.1* 2.1*  AST 19 15  ALT 32 30  ALKPHOS 62 61  BILITOT 0.3 0.2*   Sedimentation Rate No results for input(s): ESRSEDRATE in the last 72 hours. C-Reactive Protein No results for input(s): CRP in the last 72 hours.  Microbiology: Recent Results (from the past 240 hour(s))  Blood culture (routine x 2)     Status: None (Preliminary result)   Collection Time: 03/20/16  6:00 PM  Result Value Ref Range Status   Specimen Description BLOOD RIGHT ARM  Final  Special Requests BOTTLES DRAWN AEROBIC AND ANAEROBIC 5CC  Final   Culture NO GROWTH 3 DAYS  Final   Report Status PENDING  Incomplete  Blood culture (routine x 2)     Status: None (Preliminary result)   Collection Time: 03/20/16  6:10 PM  Result Value Ref Range Status   Specimen Description BLOOD RIGHT HAND  Final   Special Requests BOTTLES DRAWN AEROBIC AND ANAEROBIC 5CC  Final   Culture NO GROWTH 3 DAYS  Final   Report Status PENDING  Incomplete  CSF culture     Status: None (Preliminary result)   Collection Time: 03/20/16 10:12 PM  Result Value Ref Range Status   Specimen Description CSF  Final   Special Requests TUBE 2  2CC  Final   Gram Stain   Final    CYTOSPIN SLIDE WBC PRESENT,BOTH PMN AND MONONUCLEAR NO ORGANISMS SEEN    Culture NO GROWTH 3 DAYS  Final   Report Status PENDING  Incomplete  MRSA PCR Screening      Status: Abnormal   Collection Time: 03/20/16 11:00 PM  Result Value Ref Range Status   MRSA by PCR POSITIVE (A) NEGATIVE Final    Comment:        The GeneXpert MRSA Assay (FDA approved for NASAL specimens only), is one component of a comprehensive MRSA colonization surveillance program. It is not intended to diagnose MRSA infection nor to guide or monitor treatment for MRSA infections. RESULT CALLED TO, READ BACK BY AND VERIFIED WITH: DAVIS,E RN 0214 03/21/16 MITCHELL,L     Studies/Results: Ct Angio Head W/cm &/or Wo Cm  03/22/2016  CLINICAL DATA:  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 Now with multiple basal ganglia infarcts. EXAM: CT ANGIOGRAPHY HEAD AND NECK TECHNIQUE: Multidetector CT imaging of the head and neck was performed using the standard protocol during bolus administration of intravenous contrast. Multiplanar CT image reconstructions and MIPs were obtained to evaluate the vascular anatomy. Carotid stenosis measurements (when applicable) are obtained utilizing NASCET criteria, using the distal internal carotid diameter as the denominator. CONTRAST:  50 mL Isovue 370 IV COMPARISON:  MRI 03/21/2016 FINDINGS: CT HEAD Brain: Generalized atrophy. Multiple areas of enhancement in the basal ganglia bilaterally best seen on MRI. Patchy low-density areas are noted in the basal ganglia bilaterally which may be due to infarction. No acute hemorrhage or mass lesion. No shift of the midline structures. Calvarium and skull base: Negative Paranasal sinuses: Mucosal edema left maxillary sinus. Mild mucosal edema sphenoid sinus. Orbits: Negative CTA NECK Aortic arch: Normal aortic arch. Proximal great vessels widely patent. Bovine branching pattern. Right carotid system: Right common carotid artery widely patent. Right carotid bifurcation widely patent Left carotid system: Left common carotid artery widely patent. Left carotid bifurcation widely patent. Vertebral arteries:Both vertebral arteries widely  patent without significant stenosis. Skeleton: Negative Other neck: Negative CTA HEAD Anterior circulation: Cavernous carotid is patent bilaterally. Anterior and middle cerebral arteries are patent bilaterally. No occluded segments. The A1 and M1 segments are smaller in caliber wall caliber than expected. This could be due to basilar meningitis or spasm. MCA branches appear patent bilateral without significant irregularity to suggest vasculitis. Posterior circulation: Both vertebral arteries patent to the basilar. PICA patent bilaterally. Basilar widely patent. Superior cerebellar and posterior cerebral arteries patent bilaterally without stenosis. Venous sinuses: Negative Anatomic variants: Negative Delayed phase: Subtle areas of enhancement in the left basal ganglia as noted on MRI. No definite abnormal enhancement in the right basal ganglia. Small area of cortical enhancement in the  right parietal cortex. These areas are most consistent with subacute infarction. IMPRESSION: Circle of Willis patent without focal stenosis or branch occlusion. Anterior and middle cerebral arteries show diffuse narrowing which may be due to basilar meningitis or vasospasm. This is a subjective findings and is diffuse. Negative CTA neck Subtle enhancement in the left basal ganglia and right parietal cortex corresponding to areas of abnormal enhancement on the MRI. 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 Electronically Signed   By: Marlan Palau M.D.   On: 03/22/2016 12:24   Ct Angio Neck W/cm &/or Wo/cm  03/22/2016  CLINICAL DATA:  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 Now with multiple basal ganglia infarcts. EXAM: CT ANGIOGRAPHY HEAD AND NECK TECHNIQUE: Multidetector CT imaging of the head and neck was performed using the standard protocol during bolus administration of intravenous contrast. Multiplanar CT image reconstructions  and MIPs were obtained to evaluate the vascular anatomy. Carotid stenosis measurements (when applicable) are obtained utilizing NASCET criteria, using the distal internal carotid diameter as the denominator. CONTRAST:  50 mL Isovue 370 IV COMPARISON:  MRI 03/21/2016 FINDINGS: CT HEAD Brain: Generalized atrophy. Multiple areas of enhancement in the basal ganglia bilaterally best seen on MRI. Patchy low-density areas are noted in the basal ganglia bilaterally which may be due to infarction. No acute hemorrhage or mass lesion. No shift of the midline structures. Calvarium and skull base: Negative Paranasal sinuses: Mucosal edema left maxillary sinus. Mild mucosal edema sphenoid sinus. Orbits: Negative CTA NECK Aortic arch: Normal aortic arch. Proximal great vessels widely patent. Bovine branching pattern. Right carotid system: Right common carotid artery widely patent. Right carotid bifurcation widely patent Left carotid system: Left common carotid artery widely patent. Left carotid bifurcation widely patent. Vertebral arteries:Both vertebral arteries widely patent without significant stenosis. Skeleton: Negative Other neck: Negative CTA HEAD Anterior circulation: Cavernous carotid is patent bilaterally. Anterior and middle cerebral arteries are patent bilaterally. No occluded segments. The A1 and M1 segments are smaller in caliber wall caliber than expected. This could be due to basilar meningitis or spasm. MCA branches appear patent bilateral without significant irregularity to suggest vasculitis. Posterior circulation: Both vertebral arteries patent to the basilar. PICA patent bilaterally. Basilar widely patent. Superior cerebellar and posterior cerebral arteries patent bilaterally without stenosis. Venous sinuses: Negative Anatomic variants: Negative Delayed phase: Subtle areas of enhancement in the left basal ganglia as noted on MRI. No definite abnormal enhancement in the right basal ganglia. Small area of  cortical enhancement in the right parietal cortex. These areas are most consistent with subacute infarction. IMPRESSION: Circle of Willis patent without focal stenosis or branch occlusion. Anterior and middle cerebral arteries show diffuse narrowing which may be due to basilar meningitis or vasospasm. This is a subjective findings and is diffuse. Negative CTA neck Subtle enhancement in the left basal ganglia and right parietal cortex corresponding to areas of abnormal enhancement on the MRI. 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 Electronically Signed   By: Marlan Palau M.D.   On: 03/22/2016 12:24   Dg Abd Portable 1v  03/23/2016  CLINICAL DATA:  Encounter for NG tube placement. EXAM: PORTABLE ABDOMEN - 1 VIEW COMPARISON:  None. FINDINGS: Tip of the weighted enteric tube in the central abdomen is likely post pyloric in the region of the third portion of the duodenum. There is a normal bowel gas pattern. Enteric contrast is  seen in the last and rectosigmoid colon likely from prior modified barium swallow. IMPRESSION: Tip of the weighted enteric tube is post pyloric in the region of the third portion of the duodenum. Electronically Signed   By: Rubye Oaks M.D.   On: 03/23/2016 19:53     Assessment/Plan: Recurrent meningitis ?brain abscess Worsening Cr  Total days of antibiotics: 4 ceftriaxone/vanco  Await CH50 Await Cx Will need long term atbx, repeat imaging.  Cr better, hopefully trending down         Johny Sax Infectious Diseases (pager) [phone] [email or web address] 03/24/2016, 10:34 AM  LOS: 4 days

## 2016-03-24 NOTE — Progress Notes (Signed)
Initial Nutrition Assessment  DOCUMENTATION CODES:   Non-severe (moderate) malnutrition in context of chronic illness  INTERVENTION:    Initiate TF via Cortrak tube with Jevity 1.2 at 25 ml/h, increase by 10 ml every 4 hours to goal rate of 65 ml/h with Prostat 30 ml once daily to provide 1972 kcals, 102 gm protein, 1264 ml free water daily.  NUTRITION DIAGNOSIS:   Malnutrition related to chronic illness as evidenced by mild depletion of body fat, moderate depletion of body fat, mild depletion of muscle mass, moderate depletions of muscle mass.  GOAL:   Patient will meet greater than or equal to 90% of their needs  MONITOR:   TF tolerance, Labs, Weight trends, I & O's, Skin  REASON FOR ASSESSMENT:   Consult Enteral/tube feeding initiation and management  ASSESSMENT:   50 yo M with hx homelessness, admitted 3-14 to 03-10-16 with streptococcal (CSF Pneumococcus R-PEN) meningitis. His course was complicated by v-fib and respiratory arrest, as well as ARF due to ATN. D/C'ed to SNF. on 4-16 after being found down. He was hypothermic. His CT head suggests subacute infarcts of basal ganglia. MRI also showed these and suggested them as residual from his previous meningitis.  Patient with AMS, unable to provide any nutrition hx or to safely take PO's. Cortrak tube was placed 4/19. Received MD Consult for TF initiation and management. Nutrition-Focused physical exam completed. Findings are mild-moderate fat depletion, mild-moderate muscle depletion, and no edema. Patient with moderate PCM.  Diet Order:  Diet NPO time specified  Skin:  Reviewed, no issues  Last BM:  4/19  Height:   Ht Readings from Last 1 Encounters:  03/20/16 5\' 8"  (1.727 m)    Weight:   Wt Readings from Last 1 Encounters:  03/24/16 134 lb 7.7 oz (61 kg)    Ideal Body Weight:  72.7 kg  BMI:  Body mass index is 20.45 kg/(m^2).  Estimated Nutritional Needs:   Kcal:  1850-2100  Protein:  100-115  gm  Fluid:  >/= 2 L  EDUCATION NEEDS:   No education needs identified at this time  Joaquin CourtsKimberly [name], RD, LDN, CNSC Pager [phone] After Hours Pager [phone]

## 2016-03-24 NOTE — Progress Notes (Signed)
Pharmacy Antibiotic Note  [age] YOM with history of Strep pneumoniae meningitis to continue on vancomycin and ceftriaxone for recurrent meningitis.  Patient's renal function is improving and his vancomycin trough is slightly-supratherapeutic.  Plan: - Change vanc to 1gm IV Q24H, start this PM - CTX 2gm IV Q12H - Monitor renal fxn, clinical progress, repeat VT at new Css - Does patient still need Solu-Medrol?   Height: 5\' 8"  (172.7 cm) Weight: 134 lb 7.7 oz (61 kg) IBW/kg (Calculated) : 68.4  Temp (24hrs), Avg:97.5 F (36.4 C), Min:97.3 F (36.3 C), Max:97.7 F (36.5 C)   Recent Labs Lab 03/20/16 1800 03/20/16 1824 03/20/16 2044 03/21/16 0322 03/22/16 0443 03/23/16 0322 03/24/16 0458 03/24/16 1041  WBC 5.6  --   --  6.2 5.3 8.8 14.8*  --   CREATININE 1.43*  --   --  1.39* 1.67* 1.83* 1.66*  --   LATICACIDVEN  --  1.38 0.47*  --   --  1.2  --   --   VANCOTROUGH  --   --   --   --   --   --   --  22*    Estimated Creatinine Clearance: 45.4 mL/min (by C-G formula based on Cr of 1.66).    No Known Allergies  Antimicrobials this admission: Vanc 4/16 >> CTX 4/16 >>   Dose adjustments this admission: 4/20 VT = 22 mcg/mL on 500mg  q12 (drawn 1 hr late, SCr 1.66) >> 1gm q24  Microbiology results: 4/16 BCx x2 - NGTD 4/16 CSF - NGTD 4/16 MRSA PCR - positive 3/14 PTA CSF - few Strep pneumo - got 14d CTX per ID (thru 03/01/15)   Jaylena Holloway D. Laney Potashang, PharmD, BCPS Pager:  [phone] 03/24/2016, 11:26 AM

## 2016-03-24 NOTE — Progress Notes (Signed)
Interval History:                                                                                                                      Dale [name] is an 50 y.o. male patient with STREPTOCOCCUS PNEUMONIAE meningoencephalitis, possibly representing a combination of septic arterial cerebral emboli showing as subacute infarcts, possible vasculitis, and possible parenchymal infection with microabscesses and inflammatory changes. CTA shows no significant change but does show anterior and middle cerebral arteries with diffuse narrowing which may be due to basilar meningitis or vasospasm but stated to be a subjective finding.  Formal echo EF 45-50% with diffuse hypokinesis with no vegetation. Keppra and Solumedrol have been started.   Currently he has left gaze deviation with turning of his head to the left when stimulated. No obvious TC activity. Depakote load started now. EEG showed no epileptiform activity and MRI pending.     Past Medical History: Past Medical History  Diagnosis Date  . Depression   . Cardiac arrest (HCC)   . Cardiogenic shock (HCC)   . Acute respiratory failure with hypoxia (HCC)   . Meningitis, streptococcal 02/16/2016  . 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 Tobacco abuse 02/16/2016  . Substance abuse   . ETOH abuse   . Septic shock [facility])     Past Surgical History  Procedure Laterality Date  . Ankle surgery    . Cardiac catheterization N/A 02/17/2016    Procedure: IABP Insertion;  Surgeon: Iran Ouch, MD;  Location: [facility] CV LAB;  Service: Cardiovascular;  Laterality: N/A;    Family History: History reviewed. No pertinent family history.  Social History:   reports that he has been smoking Cigarettes.  He has been smoking about 0.10 packs per day. He does not have any smokeless tobacco history on file. He reports that he drinks alcohol. He reports that he uses illicit drugs (Marijuana).  Allergies:  No Known Allergies   Medications:                                                                                                                          Current facility-administered medications:  .  antiseptic oral rinse (CPC / CETYLPYRIDINIUM CHLORIDE 0.05%) solution 7 mL, 7 mL, Mouth Rinse, q12n4p, Drema Dallas, MD, 7 mL at 03/23/16 1602 .  cefTRIAXone (ROCEPHIN) 2 g in dextrose 5 % 50 mL IVPB, 2 g, Intravenous, Q12H, Stevphen Rochester, RPH, 2 g at 03/24/16 1018 .  chlorhexidine (PERIDEX) 0.12 % solution 15 mL, 15 mL, Mouth Rinse, BID, Drema Dallas, MD, 15 mL at 03/24/16 1044 .  Chlorhexidine Gluconate Cloth 2 % PADS 6 each, 6 each, Topical, Q0600, Chilton Greathouse, MD, 6 each at 03/23/16 1000 .  dextrose 5 %-0.45 % sodium chloride infusion, , Intravenous, Continuous, Lonia Blood, MD, Last Rate: 125 mL/hr at 03/24/16 0727 .  enoxaparin (LOVENOX) injection 40 mg, 40 mg, Subcutaneous, Q24H, Orville Govern, MD, 40 mg at 03/23/16 2208 .  famotidine (PEPCID) IVPB 20 mg premix, 20 mg, Intravenous, Q24H, Ram Daniel Nones, MD, 20 mg at 03/24/16 1044 .  feeding supplement (JEVITY 1.2 CAL) liquid 1,000 mL, 1,000 mL, Per Tube, Continuous, Lonia Blood, MD .  feeding supplement (PRO-STAT SUGAR FREE 64) liquid 30 mL, 30 mL, Per Tube, Daily, Lonia Blood, MD .  folic acid injection 1 mg, 1 mg, Intravenous, Daily, Lonia Blood, MD, 1 mg at 03/24/16 1045 .  insulin aspart (novoLOG) injection 0-9 Units, 0-9 Units, Subcutaneous, 6 times per day, Chilton Greathouse, MD, 1 Units at 03/24/16 0025 .  levETIRAcetam (KEPPRA) 500 mg in sodium chloride 0.9 % 100 mL IVPB, 500 mg, Intravenous, Q12H, Ram Daniel Nones, MD, 500 mg at 03/24/16 1011 .  methylPREDNISolone sodium succinate (SOLU-MEDROL) 125 mg/2 mL injection 125 mg, 125 mg, Intravenous, 3 times per day, Ram Daniel Nones, MD, 125 mg at 03/24/16 0553 .  mupirocin ointment (BACTROBAN) 2 % 1 application, 1 application, Nasal, BID, Praveen Mannam, MD, 1  application at 03/24/16 1044 .  thiamine (B-1) injection 100 mg, 100 mg, Intravenous, Daily, Lonia Blood, MD, 100 mg at 03/24/16 1044 .  valproate (DEPACON) 500 mg in dextrose 5 % 50 mL IVPB, 500 mg, Intravenous, Q8H, Ram Daniel Nones, MD, 500 mg at 03/24/16 0527 .  vancomycin (VANCOCIN) 500 mg in sodium chloride 0.9 % 100 mL IVPB, 500 mg, Intravenous, Q12H, Alvira Monday, MD, 500 mg at 03/23/16 2219   Neurologic Examination:                                                                                                     Today's Vitals   03/23/16 2331 03/24/16 0349 03/24/16 0352 03/24/16 0800  BP: 111/81  112/87   Pulse: 64  70   Temp:  97.5 F (36.4 C)  97.3 F (36.3 C)  TempSrc:  Axillary  Oral  Resp: 17  14   Height:      Weight:   61 kg (134 lb 7.7 oz)   SpO2: 100%  100%     Mental Status: Follows no commands but localizes centrally to sternal rub Cranial Nerves: II: No blinks to threat bilaterally, pupils equal, round, reactive to light and accommodation III,IV, VI: eye lids shut , left gaze preference V,VII: smile symmetric, facial light touch sensation normal bilaterally VIII: no reaction to voice IX,X: uvula unable to visualize XI: bilateral shoulder shrug XII: midline tongue extension Motor: Extremities with minimal increased tone throughout Sensory: only responds to sternal rub  Deep Tendon Reflexes:  1+ and symmetric throughout Plantars: Mute bilaterally Cerebellar: Unable to obtain Gait: not tested    Lab Results: Basic Metabolic Panel:  Recent Labs Lab 03/20/16 1800 03/21/16 0322 03/22/16 0443 03/23/16 0322 03/24/16 0458  NA 146* 150* 150* 148* 145  K 4.2 4.3 4.2 4.3 3.7  CL 109 120* 117* 117* 112*  CO2 25 21* 23 18* 17*  GLUCOSE 87 59* 81 124* 121*  BUN 22* 24* 16 20 22*  CREATININE 1.43* 1.39* 1.67* 1.83* 1.66*  CALCIUM 9.3 8.0* 8.7* 8.8* 8.5*  MG  --  1.7  --  1.8  --   PHOS  --  4.1  --   --   --     Liver  Function Tests:  Recent Labs Lab 03/20/16 1800 03/23/16 0322 03/24/16 0458  AST 28 19 15   ALT 40 32 30  ALKPHOS 69 62 61  BILITOT 0.5 0.3 0.2*  PROT 6.8 6.2* 5.7*  ALBUMIN 2.9* 2.1* 2.1*   No results for input(s): LIPASE, AMYLASE in the last 168 hours.  Recent Labs Lab 03/24/16 0458  AMMONIA 43*    CBC:  Recent Labs Lab 03/20/16 1800 03/21/16 0322 03/22/16 0443 03/23/16 0322 03/24/16 0458  WBC 5.6 6.2 5.3 8.8 14.8*  NEUTROABS 3.1  --   --  7.3 13.1*  HGB 11.5* 10.3* 10.7* 10.8* 10.2*  HCT 35.9* 32.8* 32.2* 34.0* 31.7*  MCV 92.8 91.9 92.0 89.7 89.5  PLT 336 307 243 275 263    Cardiac Enzymes:  Recent Labs Lab 03/21/16 0322 03/21/16 1240  TROPONINI <0.03 <0.03    Lipid Panel: No results for input(s): CHOL, TRIG, HDL, CHOLHDL, VLDL, LDLCALC in the last 168 hours.  CBG:  Recent Labs Lab 03/23/16 1606 03/23/16 1949 03/23/16 2330 03/24/16 0352 03/24/16 0832  GLUCAP 123* 115* 121* 111* 110*    Microbiology: Results for orders placed or performed during the hospital encounter of 03/20/16  Blood culture (routine x 2)     Status: None (Preliminary result)   Collection Time: 03/20/16  6:00 PM  Result Value Ref Range Status   Specimen Description BLOOD RIGHT ARM  Final   Special Requests BOTTLES DRAWN AEROBIC AND ANAEROBIC 5CC  Final   Culture NO GROWTH 3 DAYS  Final   Report Status PENDING  Incomplete  Blood culture (routine x 2)     Status: None (Preliminary result)   Collection Time: 03/20/16  6:10 PM  Result Value Ref Range Status   Specimen Description BLOOD RIGHT HAND  Final   Special Requests BOTTLES DRAWN AEROBIC AND ANAEROBIC 5CC  Final   Culture NO GROWTH 3 DAYS  Final   Report Status PENDING  Incomplete  CSF culture     Status: None (Preliminary result)   Collection Time: 03/20/16 10:12 PM  Result Value Ref Range Status   Specimen Description CSF  Final   Special Requests TUBE 2  2CC  Final   Gram Stain   Final    CYTOSPIN SLIDE WBC  PRESENT,BOTH PMN AND MONONUCLEAR NO ORGANISMS SEEN    Culture NO GROWTH 3 DAYS  Final   Report Status PENDING  Incomplete  MRSA PCR Screening     Status: Abnormal   Collection Time: 03/20/16 11:00 PM  Result Value Ref Range Status   MRSA by PCR POSITIVE (A) NEGATIVE Final    Comment:        The GeneXpert MRSA Assay (FDA approved for NASAL specimens only), is one component of a comprehensive MRSA colonization surveillance  program. It is not intended to diagnose MRSA infection nor to guide or monitor treatment for MRSA infections. RESULT CALLED TO, READ BACK BY AND VERIFIED WITH: DAVIS,E RN 0214 03/21/16 MITCHELL,L     Imaging: Ct Angio Head W/cm &/or Wo Cm  03/22/2016  CLINICAL DATA:  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 Now with multiple basal ganglia infarcts. EXAM: CT ANGIOGRAPHY HEAD AND NECK TECHNIQUE: Multidetector CT imaging of the head and neck was performed using the standard protocol during bolus administration of intravenous contrast. Multiplanar CT image reconstructions and MIPs were obtained to evaluate the vascular anatomy. Carotid stenosis measurements (when applicable) are obtained utilizing NASCET criteria, using the distal internal carotid diameter as the denominator. CONTRAST:  50 mL Isovue 370 IV COMPARISON:  MRI 03/21/2016 FINDINGS: CT HEAD Brain: Generalized atrophy. Multiple areas of enhancement in the basal ganglia bilaterally best seen on MRI. Patchy low-density areas are noted in the basal ganglia bilaterally which may be due to infarction. No acute hemorrhage or mass lesion. No shift of the midline structures. Calvarium and skull base: Negative Paranasal sinuses: Mucosal edema left maxillary sinus. Mild mucosal edema sphenoid sinus. Orbits: Negative CTA NECK Aortic arch: Normal aortic arch. Proximal great vessels widely patent. Bovine branching pattern. Right carotid system: Right common carotid artery widely patent. Right carotid bifurcation widely patent Left  carotid system: Left common carotid artery widely patent. Left carotid bifurcation widely patent. Vertebral arteries:Both vertebral arteries widely patent without significant stenosis. Skeleton: Negative Other neck: Negative CTA HEAD Anterior circulation: Cavernous carotid is patent bilaterally. Anterior and middle cerebral arteries are patent bilaterally. No occluded segments. The A1 and M1 segments are smaller in caliber wall caliber than expected. This could be due to basilar meningitis or spasm. MCA branches appear patent bilateral without significant irregularity to suggest vasculitis. Posterior circulation: Both vertebral arteries patent to the basilar. PICA patent bilaterally. Basilar widely patent. Superior cerebellar and posterior cerebral arteries patent bilaterally without stenosis. Venous sinuses: Negative Anatomic variants: Negative Delayed phase: Subtle areas of enhancement in the left basal ganglia as noted on MRI. No definite abnormal enhancement in the right basal ganglia. Small area of cortical enhancement in the right parietal cortex. These areas are most consistent with subacute infarction. IMPRESSION: Circle of Willis patent without focal stenosis or branch occlusion. Anterior and middle cerebral arteries show diffuse narrowing which may be due to basilar meningitis or vasospasm. This is a subjective findings and is diffuse. Negative CTA neck Subtle enhancement in the left basal ganglia and right parietal cortex corresponding to areas of abnormal enhancement on the MRI. 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 Electronically Signed   By: Marlan Palau M.D.   On: 03/22/2016 12:24   Ct Head Wo Contrast  03/20/2016  CLINICAL DATA:  Patient status post witnessed fall. Abrasion to the left periorbital soft tissues. EXAM: CT HEAD WITHOUT CONTRAST CT CERVICAL SPINE WITHOUT CONTRAST TECHNIQUE:  Multidetector CT imaging of the head and cervical spine was performed following the standard protocol without intravenous contrast. Multiplanar CT image reconstructions of the cervical spine were also generated. COMPARISON:  Brain CT 02/17/2016; 02/16/2016. FINDINGS: CT HEAD FINDINGS Ventricles and sulci are appropriate for patient's age. Interval development of left-greater-than-right basal ganglia hypodensities. No evidence for acute intracranial hemorrhage, mass lesion or mass-effect. Orbits are unremarkable. Paranasal sinuses are well aerated. Mucosal thickening left maxillary sinus. Mastoid air cells are unremarkable. Calvarium is intact. CT CERVICAL SPINE FINDINGS Normal anatomic alignment. No  evidence for acute cervical spine fracture. Craniocervical junction is intact. Preservation of the vertebral body and intervertebral disc space heights. 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 Lung apices are clear. Thyroid is unremarkable. Prevertebral soft tissues unremarkable. IMPRESSION: Interval development of hypodensities within the left-greater-than-right basal ganglia which may represent subacute infarcts. No acute cervical spine fracture. Electronically Signed   By: Annia Belt M.D.   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 Now with multiple basal ganglia infarcts. EXAM: CT ANGIOGRAPHY HEAD AND NECK TECHNIQUE: Multidetector CT imaging of the head and neck was performed using the standard protocol during bolus administration of intravenous contrast. Multiplanar CT image reconstructions and MIPs were obtained to evaluate the vascular anatomy. Carotid stenosis measurements (when applicable) are obtained utilizing NASCET criteria, using the distal internal carotid diameter as the denominator. CONTRAST:  50 mL Isovue 370 IV COMPARISON:  MRI 03/21/2016 FINDINGS: CT HEAD Brain: Generalized atrophy. Multiple areas of enhancement in the  basal ganglia bilaterally best seen on MRI. Patchy low-density areas are noted in the basal ganglia bilaterally which may be due to infarction. No acute hemorrhage or mass lesion. No shift of the midline structures. Calvarium and skull base: Negative Paranasal sinuses: Mucosal edema left maxillary sinus. Mild mucosal edema sphenoid sinus. Orbits: Negative CTA NECK Aortic arch: Normal aortic arch. Proximal great vessels widely patent. Bovine branching pattern. Right carotid system: Right common carotid artery widely patent. Right carotid bifurcation widely patent Left carotid system: Left common carotid artery widely patent. Left carotid bifurcation widely patent. Vertebral arteries:Both vertebral arteries widely patent without significant stenosis. Skeleton: Negative Other neck: Negative CTA HEAD Anterior circulation: Cavernous carotid is patent bilaterally. Anterior and middle cerebral arteries are patent bilaterally. No occluded segments. The A1 and M1 segments are smaller in caliber wall caliber than expected. This could be due to basilar meningitis or spasm. MCA branches appear patent bilateral without significant irregularity to suggest vasculitis. Posterior circulation: Both vertebral arteries patent to the basilar. PICA patent bilaterally. Basilar widely patent. Superior cerebellar and posterior cerebral arteries patent bilaterally without stenosis. Venous sinuses: Negative Anatomic variants: Negative Delayed phase: Subtle areas of enhancement in the left basal ganglia as noted on MRI. No definite abnormal enhancement in the right basal ganglia. Small area of cortical enhancement in the right parietal cortex. These areas are most consistent with subacute infarction. IMPRESSION: Circle of Willis patent without focal stenosis or branch occlusion. Anterior and middle cerebral arteries show diffuse narrowing which may be due to basilar meningitis or vasospasm. This is a subjective findings and is diffuse.  Negative CTA neck Subtle enhancement in the left basal ganglia and right parietal cortex corresponding to areas of abnormal enhancement on the MRI. 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 Electronically Signed   By: Marlan Palau M.D.   On: 03/22/2016 12:24   Ct Cervical Spine Wo Contrast  03/20/2016  CLINICAL DATA:  Patient status post witnessed fall. Abrasion to the left periorbital soft tissues. EXAM: CT HEAD WITHOUT CONTRAST CT CERVICAL SPINE WITHOUT CONTRAST TECHNIQUE: Multidetector CT imaging of the head and cervical spine was performed following the standard protocol without intravenous contrast. Multiplanar CT image reconstructions of the cervical spine were also generated. COMPARISON:  Brain CT 02/17/2016; 02/16/2016. FINDINGS: CT HEAD FINDINGS Ventricles and sulci are appropriate for patient's age. Interval development of left-greater-than-right basal ganglia hypodensities. No evidence  for acute intracranial hemorrhage, mass lesion or mass-effect. Orbits are unremarkable. Paranasal sinuses are well aerated. Mucosal thickening left maxillary sinus. Mastoid air cells are unremarkable. Calvarium is intact. CT CERVICAL SPINE FINDINGS Normal anatomic alignment. No evidence for acute cervical spine fracture. Craniocervical junction is intact. Preservation of the vertebral body and intervertebral disc space heights. 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 Lung apices are clear. Thyroid is unremarkable. Prevertebral soft tissues unremarkable. IMPRESSION: Interval development of hypodensities within the left-greater-than-right basal ganglia which may represent subacute infarcts. No acute cervical spine fracture. Electronically Signed   By: Annia Beltrew  Davis M.D.   On: 03/20/2016 19:15   Mr Laqueta JeanBrain W ZOWo Contrast  03/21/2016  CLINICAL DATA:  Found on floor. 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 EXAM:  MRI HEAD WITHOUT AND WITH CONTRAST TECHNIQUE: Multiplanar, multiecho pulse sequences of the brain and surrounding structures were obtained without and with intravenous contrast. CONTRAST:  13mL MULTIHANCE GADOBENATE DIMEGLUMINE 529 MG/ML IV SOLN COMPARISON:  CT head 03/20/2016 FINDINGS: Image quality degraded by motion. Postcontrast images have significant motion. Edema in the basal ganglia bilaterally left greater than right as noted on CT. There is enhancement in the basal ganglia bilaterally. There is abnormal enhancement in the external capsule on the left, rim enhancing enhancement in the left internal capsule and medial basal ganglia. Abnormal enhancement in the left caudate. There is enhancement in the right caudate and right putamen. Many of these areas are within vascular distribution suggesting ischemia. Small area of enhancement in the right parietal cortex could also be an area of subacute infarct. Diffusion-weighted imaging shows restricted diffusion in the left putamen and caudate and possibly in the external capsule on the left. These are most likely areas of subacute infarct. Restricted diffusion also noted in the right parietal cortex where there is abnormal enhancement. Right basal ganglia does not show significant restricted diffusion but does show T2 shine through. Ventricle size normal. No shift of the midline structures. Negative for intracranial hemorrhage. IMPRESSION: Image quality degraded by significant motion. There is extensive abnormal enhancement in the basal ganglia bilaterally. Some of these areas show restricted diffusion left basal ganglia. The enhancement pattern and vascular territory suggest the enhancement is related to subacute infarct related to meningitis 1 month ago. Similar area of enhancement and restricted diffusion in the right parietal cortex. No intracranial hemorrhage. Electronically Signed   By: Marlan Palauharles  Clark M.D.   On: 03/21/2016 19:06   Dg Chest Portable 1  View  03/20/2016  CLINICAL DATA:  Recent fall with altered mental status, initial encounter EXAM: PORTABLE CHEST 1 VIEW COMPARISON:  03/05/2016 FINDINGS: The heart size and mediastinal contours are within normal limits. Both lungs are clear. The visualized skeletal structures are unremarkable. IMPRESSION: No active disease. Electronically Signed   By: Alcide CleverMark  Lukens M.D.   On: 03/20/2016 18:16   Dg Abd Portable 1v  03/23/2016  CLINICAL DATA:  Encounter for NG tube placement. EXAM: PORTABLE ABDOMEN - 1 VIEW COMPARISON:  None. FINDINGS: Tip of the weighted enteric tube in the central abdomen is likely post pyloric in the region of the third portion of the duodenum. There is a normal bowel gas pattern. Enteric contrast is seen in the last and rectosigmoid colon likely from prior modified barium swallow. IMPRESSION: Tip of the weighted enteric tube is post pyloric in the region of the third portion of the duodenum. Electronically Signed   By: Rubye OaksMelanie  Ehinger M.D.   On: 03/23/2016 19:53    Will be seen by  Dr. Lavon Paganini. Please see his attestation note for A/P for any additional work up recommendations.    Assessment and plan:   Dale [name] is an 50 y.o. male patient with recurrent meningitis--ID following. Now presenting with left gaze preference and no seizure activity on EEG. Currently on Keppra 500 BID and Depakote 500 TID. Currently awaiting MRI results.

## 2016-03-24 NOTE — Progress Notes (Signed)
[place] TEAM 1 - Stepdown/ICU TEAM PROGRESS NOTE  DONALDSON Dale [name]:[id number] DOB: [birth year] DOA: 03/20/2016 PCP: No primary care provider on file.  Admit HPI / Brief Narrative: 50 yo M with hx homelessness, admitted 3-14 to 03-10-16 with streptococcal (CSF Pneumococcus R-PEN) meningitis. His course was complicated by v-fib and respiratory arrest, as well as ARF due to ATN. He received 14 days of ceftriaxone, and weekly IM Pen G (for positive RPR 1:2). He was d/c to SNF. Non-verbal. At SNF his mental status was stable.  He returns on 4-16 after being found down. He was hypothermic. He was started on vanco/ceftriaxone. He had repeat LP showing 400 WBC (75%N). Prot > 600, Glc 37. He had solumedrol added. His CT head suggests subacute infarcts of basal ganglia. MRI also showed these and suggested them as residual from his previous meningitis. (per Dr. Ninetta Lights)  HPI/Subjective: Pt required sedation for his MRI, and presently remains sedate.  He does not appear uncomfortable or in any acute distress.    Assessment/Plan:  Severe sepsis - Recurrent meningitis +/- SBE -empiric coverage with vanc + rocephin -ID following -will need TEE to r/o SBE when stable  -pneumococcus isolated from his blood and CSF on 02/16/16 -will need long term abx per ID therefore place PICC  Acute encephalopathy / L gaze preference / B basal ganglia abnormality / L corona radiata infarct -multifactorial to include severe sepsis, subacute infarct -Neuro directing care of this issue   Chronic Systolic and Diastolic CHF  -EF 45-50% on TTE 4/18 (improved since March) - no evidence of signif volume overload at this time - wgt at d/c 4/11 ~56kg  Filed Weights   03/22/16 0500 03/23/16 0500 03/24/16 0352  Weight: 59.3 kg (130 lb 11.7 oz) 61.5 kg (135 lb 9.3 oz) 61 kg (134 lb 7.7 oz)   Acute renal failure -multifactorial to include sepsis, heart failure, dehydration - crt peaked at 1.83 and has since improved somewhat -  follow    Recent Labs Lab 03/20/16 1800 03/21/16 0322 03/22/16 0443 03/23/16 0322 03/24/16 0458  CREATININE 1.43* 1.39* 1.67* 1.83* 1.66*   Hypernatremia -due to true dehydration - cont free water   Recent Labs Lab 03/20/16 1800 03/21/16 0322 03/22/16 0443 03/23/16 0322 03/24/16 0458  NA 146* 150* 150* 148* 145   Hypothermia -resolved   Non-severe (moderate) malnutrition in context of chronic illness -CoreTrack placed for feeding - beginning tube feeds and free water  MRSA Screen +  Code Status: FULL Family Communication: no family present at time of exam Disposition Plan: SDU  Consultants: Neurology  ID  Procedures: 4/18 TTE - EF 45-50% - diffuse hypokinesis - grade 1 diastolic dysfunction  Antibiotics: Vancomycin 4/16 > Rocephin 4/16 >  DVT prophylaxis: lovenox   Objective: Blood pressure 101/75, pulse 63, temperature 97.6 F (36.4 C), temperature source Oral, resp. rate 12, height  (1.727 m), weight 61 kg (134 lb 7.7 oz), SpO2 93 %.  Intake/Output Summary (Last 24 hours) at 03/24/16 1626 Last data filed at 03/24/16 1430  Gross per 24 hour  Intake 2720.83 ml  Output   1525 ml  Net 1195.83 ml   Exam: General: No acute respiratory distress Lungs: Clear to auscultation bilaterally - no wheezes or crackles Cardiovascular: Regular rate and rhythm without murmur gallop or rub  Abdomen: Nondistended, soft, bowel sounds positive, no ascites, no appreciable mass Extremities: No significant cyanosis, clubbing, edema bilateral lower extremities  Data Reviewed:  Basic Metabolic Panel:  Recent Labs Lab  03/20/16 1800 03/21/16 0322 03/22/16 0443 03/23/16 0322 03/24/16 0458  NA 146* 150* 150* 148* 145  K 4.2 4.3 4.2 4.3 3.7  CL 109 120* 117* 117* 112*  CO2 25 21* 23 18* 17*  GLUCOSE 87 59* 81 124* 121*  BUN 22* 24* 16 20 22*  CREATININE 1.43* 1.39* 1.67* 1.83* 1.66*  CALCIUM 9.3 8.0* 8.7* 8.8* 8.5*  MG  --  1.7  --  1.8  --   PHOS  --  4.1   --   --   --     CBC:  Recent Labs Lab 03/20/16 1800 03/21/16 0322 03/22/16 0443 03/23/16 0322 03/24/16 0458  WBC 5.6 6.2 5.3 8.8 14.8*  NEUTROABS 3.1  --   --  7.3 13.1*  HGB 11.5* 10.3* 10.7* 10.8* 10.2*  HCT 35.9* 32.8* 32.2* 34.0* 31.7*  MCV 92.8 91.9 92.0 89.7 89.5  PLT 336 307 243 275 263    Liver Function Tests:  Recent Labs Lab 03/20/16 1800 03/23/16 0322 03/24/16 0458  AST 28 19 15   ALT 40 32 30  ALKPHOS 69 62 61  BILITOT 0.5 0.3 0.2*  PROT 6.8 6.2* 5.7*  ALBUMIN 2.9* 2.1* 2.1*    Recent Labs Lab 03/24/16 0458  AMMONIA 43*   Cardiac Enzymes:  Recent Labs Lab 03/21/16 0322 03/21/16 1240  TROPONINI <0.03 <0.03    CBG:  Recent Labs Lab 03/23/16 1949 03/23/16 2330 03/24/16 0352 03/24/16 0832 03/24/16 1307  GLUCAP 115* 121* 111* 110* 107*    Recent Results (from the past 240 hour(s))  Blood culture (routine x 2)     Status: None (Preliminary result)   Collection Time: 03/20/16  6:00 PM  Result Value Ref Range Status   Specimen Description BLOOD RIGHT ARM  Final   Special Requests BOTTLES DRAWN AEROBIC AND ANAEROBIC 5CC  Final   Culture NO GROWTH 4 DAYS  Final   Report Status PENDING  Incomplete  Blood culture (routine x 2)     Status: None (Preliminary result)   Collection Time: 03/20/16  6:10 PM  Result Value Ref Range Status   Specimen Description BLOOD RIGHT HAND  Final   Special Requests BOTTLES DRAWN AEROBIC AND ANAEROBIC 5CC  Final   Culture NO GROWTH 4 DAYS  Final   Report Status PENDING  Incomplete  CSF culture     Status: None   Collection Time: 03/20/16 10:12 PM  Result Value Ref Range Status   Specimen Description CSF  Final   Special Requests TUBE 2  2CC  Final   Gram Stain   Final    CYTOSPIN SLIDE WBC PRESENT,BOTH PMN AND MONONUCLEAR NO ORGANISMS SEEN    Culture NO GROWTH 3 DAYS  Final   Report Status 03/24/2016 FINAL  Final  MRSA PCR Screening     Status: Abnormal   Collection Time: 03/20/16 11:00 PM  Result  Value Ref Range Status   MRSA by PCR POSITIVE (A) NEGATIVE Final    Comment:        The GeneXpert MRSA Assay (FDA approved for NASAL specimens only), is one component of a comprehensive MRSA colonization surveillance program. It is not intended to diagnose MRSA infection nor to guide or monitor treatment for MRSA infections. RESULT CALLED TO, READ BACK BY AND VERIFIED WITH: DAVIS,E RN 0214 03/21/16 MITCHELL,L      Studies:   Recent x-ray studies have been reviewed in detail by the Attending Physician  Scheduled Meds:  Scheduled Meds: . antiseptic oral rinse  7 mL Mouth Rinse q12n4p  . cefTRIAXone (ROCEPHIN)  IV  2 g Intravenous Q12H  . chlorhexidine  15 mL Mouth Rinse BID  . Chlorhexidine Gluconate Cloth  6 each Topical Q0600  . enoxaparin (LOVENOX) injection  40 mg Subcutaneous Q24H  . famotidine (PEPCID) IV  20 mg Intravenous Q24H  . feeding supplement (PRO-STAT SUGAR FREE 64)  30 mL Per Tube Daily  . folic acid  1 mg Intravenous Daily  . levETIRAcetam  500 mg Intravenous Q12H  . methylPREDNISolone (SOLU-MEDROL) injection  125 mg Intravenous 3 times per day  . mupirocin ointment  1 application Nasal BID  . thiamine IV  100 mg Intravenous Daily  . valproate sodium  500 mg Intravenous Q8H  . vancomycin  1,000 mg Intravenous Q24H    Time spent on care of this patient: 35 mins   MCCLUNG,JEFFREY T , MD   [facility] Office  [phone] Pager - Text Page per Loretha Stapler as per below:  On-Call/Text Page:      [email or web address]      password [email or web address]  If 7PM-7AM, please contact night-coverage [email or web address] Password [email or web address] 03/24/2016, 4:26 PM   LOS: 4 days

## 2016-03-25 ENCOUNTER — Inpatient Hospital Stay (HOSPITAL_COMMUNITY): Payer: Medicaid Other

## 2016-03-25 ENCOUNTER — Encounter: Payer: Self-pay | Admitting: Internal Medicine

## 2016-03-25 DIAGNOSIS — G0490 Encephalitis and encephalomyelitis, unspecified: Secondary | ICD-10-CM | POA: Insufficient documentation

## 2016-03-25 DIAGNOSIS — I639 Cerebral infarction, unspecified: Secondary | ICD-10-CM | POA: Insufficient documentation

## 2016-03-25 DIAGNOSIS — I6330 Cerebral infarction due to thrombosis of unspecified cerebral artery: Secondary | ICD-10-CM

## 2016-03-25 DIAGNOSIS — G9340 Encephalopathy, unspecified: Secondary | ICD-10-CM | POA: Insufficient documentation

## 2016-03-25 DIAGNOSIS — G009 Bacterial meningitis, unspecified: Secondary | ICD-10-CM | POA: Insufficient documentation

## 2016-03-25 HISTORY — DX: Cerebral infarction due to thrombosis of unspecified cerebral artery: I63.30

## 2016-03-25 LAB — GLUCOSE, CAPILLARY
GLUCOSE-CAPILLARY: 103 mg/dL — AB (ref 65–99)
GLUCOSE-CAPILLARY: 84 mg/dL (ref 65–99)
GLUCOSE-CAPILLARY: 99 mg/dL (ref 65–99)
Glucose-Capillary: 94 mg/dL (ref 65–99)
Glucose-Capillary: 97 mg/dL (ref 65–99)
Glucose-Capillary: 113 mg/dL — ABNORMAL HIGH (ref 65–99)
Glucose-Capillary: 104 mg/dL — ABNORMAL HIGH (ref 65–99)

## 2016-03-25 LAB — BASIC METABOLIC PANEL
ANION GAP: 13 (ref 5–15)
BUN: 27 mg/dL — ABNORMAL HIGH (ref 6–20)
CALCIUM: 8.6 mg/dL — AB (ref 8.9–10.3)
CO2: 19 mmol/L — AB (ref 22–32)
CREATININE: 1.51 mg/dL — AB (ref 0.61–1.24)
Chloride: 114 mmol/L — ABNORMAL HIGH (ref 101–111)
GFR calc non Af Amer: 52 mL/min — ABNORMAL LOW (ref >60)
Glucose, Bld: 84 mg/dL (ref 65–99)
Potassium: 4 mmol/L (ref 3.5–5.1)
SODIUM: 146 mmol/L — AB (ref 135–145)

## 2016-03-25 LAB — CULTURE, BLOOD (ROUTINE X 2)
CULTURE: NO GROWTH
Culture: NO GROWTH

## 2016-03-25 LAB — CBC
HEMATOCRIT: 30.9 % — AB (ref 39.0–52.0)
HEMOGLOBIN: 10.5 g/dL — AB (ref 13.0–17.0)
MCH: 29.7 pg (ref 26.0–34.0)
MCHC: 34 g/dL (ref 30.0–36.0)
MCV: 87.5 fL (ref 78.0–100.0)
Platelets: 224 10*3/uL (ref 150–400)
RBC: 3.53 MIL/uL — ABNORMAL LOW (ref 4.22–5.81)
RDW: 14.8 % (ref 11.5–15.5)
WBC: 12.9 10*3/uL — AB (ref 4.0–10.5)

## 2016-03-25 LAB — PHOSPHORUS: PHOSPHORUS: 4 mg/dL (ref 2.5–4.6)

## 2016-03-25 MED ORDER — ASPIRIN 325 MG PO TABS
325.0000 mg | ORAL_TABLET | Freq: Every day | ORAL | Status: DC
  Administered 2016-03-26 – 2016-04-05 (×10): 325 mg
  Filled 2016-03-25 (×10): qty 1

## 2016-03-25 MED ORDER — SODIUM CHLORIDE 0.9% FLUSH
10.0000 mL | Freq: Two times a day (BID) | INTRAVENOUS | Status: DC
  Administered 2016-03-25: 10 mL
  Administered 2016-03-26: 20 mL
  Administered 2016-03-26 – 2016-04-02 (×8): 10 mL

## 2016-03-25 MED ORDER — SODIUM CHLORIDE 0.9% FLUSH
10.0000 mL | INTRAVENOUS | Status: DC | PRN
  Administered 2016-03-28 – 2016-03-29 (×2): 10 mL
  Filled 2016-03-25 (×2): qty 40

## 2016-03-25 MED ORDER — FREE WATER
200.0000 mL | Freq: Three times a day (TID) | Status: DC
  Administered 2016-03-26 (×2): 200 mL

## 2016-03-25 MED ORDER — METHYLPREDNISOLONE SODIUM SUCC 125 MG IJ SOLR
125.0000 mg | Freq: Three times a day (TID) | INTRAMUSCULAR | Status: DC
  Administered 2016-03-26 – 2016-03-30 (×13): 125 mg via INTRAVENOUS
  Filled 2016-03-25 (×13): qty 2

## 2016-03-25 MED ORDER — ASPIRIN EC 325 MG PO TBEC
325.0000 mg | DELAYED_RELEASE_TABLET | Freq: Every day | ORAL | Status: DC
  Administered 2016-03-25: 325 mg via ORAL
  Filled 2016-03-25: qty 1

## 2016-03-25 NOTE — Progress Notes (Signed)
STROKE TEAM PROGRESS NOTE   HISTORY OF PRESENT ILLNESS Dale [name] is an 50 y.o. male patient with STREPTOCOCCUS PNEUMONIAE meningoencephalitis, possibly representing a combination of septic arterial cerebral emboli showing as subacute infarcts, possible vasculitis, and possible parenchymal infection with microabscesses and inflammatory changes. CTA shows no significant change but does show anterior and middle cerebral arteries with diffuse narrowing which may be due to basilar meningitis or vasospasm but stated to be a subjective finding. Formal echo EF 45-50% with diffuse hypokinesis with no vegetation. Keppra and Solumedrol have been started. He has left gaze deviation with turning of his head to the left when stimulated. No obvious TC activity. Depakote load started . EEG showed no epileptiform activity and MRI pending. Patient was not administered IV t-PA.    SUBJECTIVE (INTERVAL HISTORY) His RN is at the bedside.  Patient is more alert today but remains noncommunicative and encephalopathic. He continues to have dense right hemiplegia. No witnessed seizure activity. No family is available in the bedside. I have reviewed patient's medical chart, personally reviewed imaging imaging studies, lab results, CSF results, EEG and discussed the case with medical attending Dr. Sharon SellerMcClung    OBJECTIVE Temp:  [94.4 F (34.7 C)-98.5 F (36.9 C)] 97.6 F (36.4 C) (04/21 0755) Pulse Rate:  [63-87] 72 (04/21 0755) Cardiac Rhythm:  [-] Normal sinus rhythm (04/21 0800) Resp:  [10-14] 14 (04/21 0755) BP: (98-115)/(61-84) 110/80 mmHg (04/21 0755) SpO2:  [92 %-97 %] 95 % (04/21 0755) Weight:  [60.3 kg (132 lb 15 oz)] 60.3 kg (132 lb 15 oz) (04/21 0436)  CBC:  Recent Labs Lab 03/23/16 0322 03/24/16 0458 03/25/16 0428  WBC 8.8 14.8* 12.9*  NEUTROABS 7.3 13.1*  --   HGB 10.8* 10.2* 10.5*  HCT 34.0* 31.7* 30.9*  MCV 89.7 89.5 87.5  PLT 275 263 224    Basic Metabolic Panel:  Recent Labs Lab  03/21/16 0322  03/23/16 0322 03/24/16 0458 03/25/16 0428  NA 150*  < > 148* 145 146*  K 4.3  < > 4.3 3.7 4.0  CL 120*  < > 117* 112* 114*  CO2 21*  < > 18* 17* 19*  GLUCOSE 59*  < > 124* 121* 84  BUN 24*  < > 20 22* 27*  CREATININE 1.39*  < > 1.83* 1.66* 1.51*  CALCIUM 8.0*  < > 8.8* 8.5* 8.6*  MG 1.7  --  1.8  --   --   PHOS 4.1  --   --   --  4.0  < > = values in this interval not displayed.  Lipid Panel:     Component Value Date/Time   TRIG 54 02/16/2016 2016   HgbA1c: No results found for: HGBA1C Urine Drug Screen:    Component Value Date/Time   LABOPIA NONE DETECTED 02/16/2016 1140   COCAINSCRNUR NONE DETECTED 02/16/2016 1140   LABBENZ NONE DETECTED 02/16/2016 1140   AMPHETMU NONE DETECTED 02/16/2016 1140   THCU POSITIVE* 02/16/2016 1140   LABBARB NONE DETECTED 02/16/2016 1140      IMAGING  Mr Brain Wo Contrast  03/24/2016  CLINICAL DATA:  History of streptococcal pneumonia 02/16/2016. Stroke. EXAM: MRI HEAD WITHOUT CONTRAST TECHNIQUE: Multiplanar, multiecho pulse sequences of the brain and surrounding structures were obtained without intravenous contrast. COMPARISON:  CT 03/22/2016, MRI 03/21/2016 FINDINGS: New area of acute infarct in the left corona radiata measuring 10 x 15 mm with restricted diffusion. Patchy areas of increased signal in the basal ganglia left greater than right on diffusion-weighted  imaging show mild interval improvement. Restricted diffusion in the right parietal cortex also shows improvement since the prior MRI. These areas in the basal ganglia showed peripheral enhancement on the prior study. Contrast was not administered today. Small amount of hemorrhage in the left external capsule. Small amount of blood in the left frontal lobe. Small amount of blood products in the occipital horns bilaterally. These findings were present on the prior MRI of 03/21/2016 however were not well visualized or appreciated due to motion. The these areas are not  identified is hyperdense on CT. Ventricle size normal. No shift of the midline structures. No fluid collection. Minimal mucosal edema in the paranasal sinuses. Bilateral mastoid sinus effusion. Normal orbit. Normal pituitary. Normal skullbase. IMPRESSION: New area of acute infarct in the left corona radiata. Multiple areas of restricted diffusion in the basal ganglia left greater than right show mild interval improvement since the prior MRI suggesting late subacute infarct. Improvement in restricted diffusion in the right parietal cortex. Small amount of hemorrhage in the left external capsule. Mild amount of intraventricular hemorrhage. These are unchanged from the MRI of 03/21/2016 although were not appreciated on the prior study due to motion. These results were called by telephone at the time of interpretation on 03/24/2016 at 1:36 pm to [facility] PA , who verbally acknowledged these results. Electronically Signed   By: Marlan Palau M.D.   On: 03/24/2016 13:37   Dg Abd Portable 1v  03/23/2016  CLINICAL DATA:  Encounter for NG tube placement. EXAM: PORTABLE ABDOMEN - 1 VIEW COMPARISON:  None. FINDINGS: Tip of the weighted enteric tube in the central abdomen is likely post pyloric in the region of the third portion of the duodenum. There is a normal bowel gas pattern. Enteric contrast is seen in the last and rectosigmoid colon likely from prior modified barium swallow. IMPRESSION: Tip of the weighted enteric tube is post pyloric in the region of the third portion of the duodenum. Electronically Signed   By: Rubye Oaks M.D.   On: 03/23/2016 19:53   EEG moderate to severe encephalopathy from generalized cerebral dysfunction  2D Echocardiogram  - Left ventricle: The cavity size was normal. Wall thickness was normal. Systolic function was mildly reduced. The estimated ejection fraction was in the range of 45% to 50%. Diffuse hypokinesis. Doppler parameters are consistent with abnormal left ventricular  relaxation (grade 1 diastolic dysfunction). - Mitral valve: There was mild regurgitation. - Right ventricle: Systolic function was mildly reduced. - Pericardium, extracardiac: A small pericardial effusion was identified along the right ventricular free wall. The fluid had no internal echoes.There was no evidence of hemodynamic compromise. Impressions:   When compared to prior ECHO, EF is reduced (prior 50%). There was  no evidence of a vegetation.  PHYSICAL EXAM Frail middle-aged African-American male currently not in distress. . Afebrile. Head is nontraumatic. Neck is supple without bruit.    Cardiac exam no murmur or gallop. Lungs are clear to auscultation. Distal pulses are well felt. Neurological Exam ;  Awake alert and  mute and non-verbal. Does not follow commands. Left gaze preference but will not look to the right on the way. Blinks to threat on the left but not the right. Pupils Irregular but reactive. Fundi were not visualized. Right lower facial weakness. Tongue is midline. Dense right hemiplegia but will withdraw the right leg to pain. Minimum movement in the right upper extremity. Antigravity and purposeful movements on the left side. Right plantar upgoing left downgoing. Gait was  not tested. ASSESSMENT/PLAN Dale [name] is a 50 y.o. male found down at his nursing home with history of pneumococcal meningitis who in hospital developed orsening mental status left gaze preference. He did not receive IV TPA.  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 Treatment is steroids and abx, prognosis is poor.  Resultant  encephalopic non verbal state with R hemiplegia and L gaze preference  MRI  Left corona radiata infarct, left greater than right basal ganglia infarct/subacute infarct. Small amount hemorrhage, left external capsule. Mild intraventricular hemorrhage  CTA  head circle of Willis patent. ACAs and MCAs with diffuse narrowing  CTA neck negative  2D Echo  Reduced EF of 50%, no vegitation  EEG moderate to severe encephalopathy from generalized cerebral dysfunction  LDL not drawn  HgbA1c not drawn  Lovenox 40 mg sq daily for VTE prophylaxis  Diet NPO time specified  aspirin 81 mg daily prior to admission, now on aspirin 325 mg daily  Other Stroke Risk Factors  Recurrent meningitis  Cigarette smoker  ETOH use  Marijuana use  Other Active Problems  Severe sepsis, recurrent meningitis +/- SBE  Chronic systolic and diastolic CHF  Acute Renal failure  Hypernatremia  Hypothermia  Hospital day # 5  Rhoderick Moody [facility] See Amion for Pager information 03/25/2016 3:41 PM  I have personally examined this patient, reviewed notes, independently viewed imaging studies, participated in medical decision making and plan of care. I have made any additions or clarifications directly to the above note. Agree with note above. This patient is critically ill due to recurrent meningitis and has encephalopathy as well as right hemiplegia as a result. He remains at significant risk for neurological worsening, recurrent stroke, TIAs, seizures and death. Family not available at the bedside. Discussed the case with medical hospitalist Dr. Sharon Seller. Continue antibiotics. Add aspirin for stroke prevention. Prognosis is very poor. Consider discussion about palliative care with family. Time spent on this visit 32 minutes  Delia Heady, MD Medical Director [facility] Pager: [phone] 03/25/2016 5:52 PM   To contact Stroke Continuity provider, please refer to [email or web address]. After hours, contact General Neurology

## 2016-03-25 NOTE — Progress Notes (Addendum)
When entering patient's room, observed NG tube had been pulled partially out.  Tube was advanced back in, new securement device applied, MD notified and X-Ray ordered. Medications held until placement verification.  Will continue to monitor.    Cosigned by Willeen Nieceebecca Abdulahi Schor, RN

## 2016-03-25 NOTE — Progress Notes (Signed)
INFECTIOUS DISEASE PROGRESS NOTE  ID: Dale [name] is a 50 y.o. male with  Active Problems:   Hypothermia   Severe sepsis (HCC)   Meningitis, pneumococcal, recurrent   Systolic and diastolic CHF, chronic (HCC)   Acute renal failure (HCC)   Hypernatremia   Malnutrition of moderate degree   Cerebral thrombosis with cerebral infarction   Meningoencephalitis   Encephalopathy acute   Bacterial meningitis   Acute ischemic stroke (HCC)  Subjective: Awakens, does not interact  Abtx:  Anti-infectives    Start     Dose/Rate Route Frequency Ordered Stop   03/24/16 1600  vancomycin (VANCOCIN) IVPB 1000 mg/200 mL premix     1,000 mg 200 mL/hr over 60 Minutes Intravenous Every 24 hours 03/24/16 1124     03/21/16 1000  vancomycin (VANCOCIN) 500 mg in sodium chloride 0.9 % 100 mL IVPB  Status:  Discontinued     500 mg 100 mL/hr over 60 Minutes Intravenous Every 12 hours 03/20/16 1940 03/24/16 1113   03/21/16 0800  cefTRIAXone (ROCEPHIN) 2 g in dextrose 5 % 50 mL IVPB     2 g 100 mL/hr over 30 Minutes Intravenous Every 12 hours 03/21/16 0040     03/20/16 2000  vancomycin (VANCOCIN) IVPB 1000 mg/200 mL premix     1,000 mg 200 mL/hr over 60 Minutes Intravenous NOW 03/20/16 1940 03/20/16 2103   03/20/16 1930  cefTRIAXone (ROCEPHIN) 2 g in dextrose 5 % 50 mL IVPB     2 g 100 mL/hr over 30 Minutes Intravenous  Once 03/20/16 1919 03/20/16 2030      Medications:  Scheduled: . antiseptic oral rinse  7 mL Mouth Rinse q12n4p  . aspirin EC  325 mg Oral Daily  . cefTRIAXone (ROCEPHIN)  IV  2 g Intravenous Q12H  . chlorhexidine  15 mL Mouth Rinse BID  . Chlorhexidine Gluconate Cloth  6 each Topical Q0600  . enoxaparin (LOVENOX) injection  40 mg Subcutaneous Q24H  . famotidine  20 mg Per Tube Daily  . feeding supplement (PRO-STAT SUGAR FREE 64)  30 mL Per Tube Daily  . folic acid  1 mg Per Tube Daily  . levETIRAcetam  500 mg Intravenous Q12H  . methylPREDNISolone (SOLU-MEDROL) injection   125 mg Intravenous 3 times per day  . mupirocin ointment  1 application Nasal BID  . thiamine  100 mg Per Tube Daily  . valproate sodium  500 mg Intravenous Q8H  . vancomycin  1,000 mg Intravenous Q24H    Objective: Vital signs in last 24 hours: Temp:  [94.4 F (34.7 C)-98.5 F (36.9 C)] 97.6 F (36.4 C) (04/21 0755) Pulse Rate:  [63-87] 72 (04/21 0755) Resp:  [10-14] 14 (04/21 0755) BP: (98-115)/(61-84) 110/80 mmHg (04/21 0755) SpO2:  [92 %-97 %] 95 % (04/21 0755) Weight:  [60.3 kg (132 lb 15 oz)] 60.3 kg (132 lb 15 oz) (04/21 0436)   General appearance: alert and no distress Resp: clear to auscultation bilaterally Cardio: regular rate and rhythm GI: normal findings: bowel sounds normal and soft, non-tender Neurologic: Mental status: alert, does not interact with MD  Lab Results  Recent Labs  03/24/16 0458 03/25/16 0428  WBC 14.8* 12.9*  HGB 10.2* 10.5*  HCT 31.7* 30.9*  NA 145 146*  K 3.7 4.0  CL 112* 114*  CO2 17* 19*  BUN 22* 27*  CREATININE 1.66* 1.51*   Liver Panel  Recent Labs  03/23/16 0322 03/24/16 0458  PROT 6.2* 5.7*  ALBUMIN 2.1* 2.1*  AST 19 15  ALT 32 30  ALKPHOS 62 61  BILITOT 0.3 0.2*   Sedimentation Rate No results for input(s): ESRSEDRATE in the last 72 hours. C-Reactive Protein No results for input(s): CRP in the last 72 hours.  Microbiology: Recent Results (from the past 240 hour(s))  Blood culture (routine x 2)     Status: None (Preliminary result)   Collection Time: 03/20/16  6:00 PM  Result Value Ref Range Status   Specimen Description BLOOD RIGHT ARM  Final   Special Requests BOTTLES DRAWN AEROBIC AND ANAEROBIC 5CC  Final   Culture NO GROWTH 4 DAYS  Final   Report Status PENDING  Incomplete  Blood culture (routine x 2)     Status: None (Preliminary result)   Collection Time: 03/20/16  6:10 PM  Result Value Ref Range Status   Specimen Description BLOOD RIGHT HAND  Final   Special Requests BOTTLES DRAWN AEROBIC AND  ANAEROBIC 5CC  Final   Culture NO GROWTH 4 DAYS  Final   Report Status PENDING  Incomplete  CSF culture     Status: None   Collection Time: 03/20/16 10:12 PM  Result Value Ref Range Status   Specimen Description CSF  Final   Special Requests TUBE 2  2CC  Final   Gram Stain   Final    CYTOSPIN SLIDE WBC PRESENT,BOTH PMN AND MONONUCLEAR NO ORGANISMS SEEN    Culture NO GROWTH 3 DAYS  Final   Report Status 03/24/2016 FINAL  Final  MRSA PCR Screening     Status: Abnormal   Collection Time: 03/20/16 11:00 PM  Result Value Ref Range Status   MRSA by PCR POSITIVE (A) NEGATIVE Final    Comment:        The GeneXpert MRSA Assay (FDA approved for NASAL specimens only), is one component of a comprehensive MRSA colonization surveillance program. It is not intended to diagnose MRSA infection nor to guide or monitor treatment for MRSA infections. RESULT CALLED TO, READ BACK BY AND VERIFIED WITH: DAVIS,E RN 0214 03/21/16 MITCHELL,L     Studies/Results: Mr Brain Wo Contrast  03/24/2016  CLINICAL DATA:  History of streptococcal pneumonia 02/16/2016. Stroke. EXAM: MRI HEAD WITHOUT CONTRAST TECHNIQUE: Multiplanar, multiecho pulse sequences of the brain and surrounding structures were obtained without intravenous contrast. COMPARISON:  CT 03/22/2016, MRI 03/21/2016 FINDINGS: New area of acute infarct in the left corona radiata measuring 10 x 15 mm with restricted diffusion. Patchy areas of increased signal in the basal ganglia left greater than right on diffusion-weighted imaging show mild interval improvement. Restricted diffusion in the right parietal cortex also shows improvement since the prior MRI. These areas in the basal ganglia showed peripheral enhancement on the prior study. Contrast was not administered today. Small amount of hemorrhage in the left external capsule. Small amount of blood in the left frontal lobe. Small amount of blood products in the occipital horns bilaterally. These  findings were present on the prior MRI of 03/21/2016 however were not well visualized or appreciated due to motion. The these areas are not identified is hyperdense on CT. Ventricle size normal. No shift of the midline structures. No fluid collection. Minimal mucosal edema in the paranasal sinuses. Bilateral mastoid sinus effusion. Normal orbit. Normal pituitary. Normal skullbase. IMPRESSION: New area of acute infarct in the left corona radiata. Multiple areas of restricted diffusion in the basal ganglia left greater than right show mild interval improvement since the prior MRI suggesting late subacute infarct. Improvement in restricted diffusion in  the right parietal cortex. Small amount of hemorrhage in the left external capsule. Mild amount of intraventricular hemorrhage. These are unchanged from the MRI of 03/21/2016 although were not appreciated on the prior study due to motion. These results were called by telephone at the time of interpretation on 03/24/2016 at 1:36 pm to [facility] PA , who verbally acknowledged these results. Electronically Signed   By: Marlan Palau M.D.   On: 03/24/2016 13:37   Dg Abd Portable 1v  03/23/2016  CLINICAL DATA:  Encounter for NG tube placement. EXAM: PORTABLE ABDOMEN - 1 VIEW COMPARISON:  None. FINDINGS: Tip of the weighted enteric tube in the central abdomen is likely post pyloric in the region of the third portion of the duodenum. There is a normal bowel gas pattern. Enteric contrast is seen in the last and rectosigmoid colon likely from prior modified barium swallow. IMPRESSION: Tip of the weighted enteric tube is post pyloric in the region of the third portion of the duodenum. Electronically Signed   By: Rubye Oaks M.D.   On: 03/23/2016 19:53     Assessment/Plan: Recurrent meningitis ?brain abscess Worsening Cr  Total days of antibiotics: 5 ceftriaxone/vanco         Will stop isolation for mening, d/i IP Contact for MRSA PCR+ No change in  atbx Consider stopping steroids Family discussion, palliative care  Johny Sax Infectious Diseases (pager) [phone] [email or web address] 03/25/2016, 10:25 AM  LOS: 5 days

## 2016-03-25 NOTE — Progress Notes (Signed)
[place] TEAM 1 - Stepdown/ICU TEAM PROGRESS NOTE  Dale [name] RUE:[id number] DOB: 1966-10-03 DOA: 03/20/2016 PCP: No primary care provider on file.  Admit HPI / Brief Narrative: 50 yo M with hx homelessness, admitted 3-14 to 03-10-16 with streptococcal (CSF Pneumococcus R-PEN) meningitis. His course was complicated by v-fib and respiratory arrest, as well as ARF due to ATN. He received 14 days of ceftriaxone, and weekly IM Pen G (for positive RPR 1:2). He was d/c to SNF. Non-verbal. At SNF his mental status was stable.   He returned to the ED on 4-16 after being found down. He was hypothermic. He was started on vanco/ceftriaxone. He had repeat LP showing 400 WBC (75%N), Prot > 600, Glc 37. He had solumedrol added. His CT head suggested subacute infarcts of basal ganglia. MRI also showed these and suggested them as residual from his previous meningitis. (per Dr. Ninetta Lights)  HPI/Subjective: Pt less sedate today, but remains non communicative.  There is no family present in his room.  He does not appear to be in signif pain or acute resp distress.    Assessment/Plan:  Severe sepsis - Recurrent meningitis +/- SBE -empiric coverage with vanc + rocephin -ID following -will need TEE to r/o SBE when stable  -pneumococcus isolated from his blood and CSF on 02/16/16 -will need long term abx per ID therefore PICC ordered  Acute encephalopathy / L gaze preference / B basal ganglia abnormality / L corona radiata infarct -encephalopathy multifactorial to include severe sepsis, subacute infarct -Neuro directing care of this issue and feels is due to recurrent meningitis/inflammatory exudates from recurrent streptococcal meningitis involving deep penetrating arteries - Neuro suggests steroid + abx   Chronic Systolic and Diastolic CHF  -EF 45-50% on TTE 4/18 (improved since March) - no evidence of signif volume overload at this time - wgt at d/c 4/11 ~56kg - no signif volume overload at this time  [facility]  Weights   03/23/16 0500 03/24/16 0352 03/25/16 0436  Weight: 61.5 kg (135 lb 9.3 oz) 61 kg (134 lb 7.7 oz) 60.3 kg (132 lb 15 oz)   Acute renal failure -multifactorial to include sepsis, heart failure, dehydration - crt peaked at 1.83 - continues to improve    Recent Labs Lab 03/21/16 0322 03/22/16 0443 03/23/16 0322 03/24/16 0458 03/25/16 0428  CREATININE 1.39* 1.67* 1.83* 1.66* 1.51*   Hypernatremia -due to true dehydration - cont free water via NG   Recent Labs Lab 03/21/16 0322 03/22/16 0443 03/23/16 0322 03/24/16 0458 03/25/16 0428  NA 150* 150* 148* 145 146*   Hypothermia -resolved   Non-severe (moderate) malnutrition in context of chronic illness -CoreTrack placed for feeding - cont tube feeds and free water  MRSA Screen +  Code Status: FULL Family Communication: spoke w/ legal wife (separated for 20+ years but still legally married) over phone  Disposition Plan: SDU  Consultants: Neurology  ID  Procedures: 4/18 TTE - EF 45-50% - diffuse hypokinesis - grade 1 diastolic dysfunction  Antibiotics: Vancomycin 4/16 > Rocephin 4/16 >  DVT prophylaxis: lovenox   Objective: Blood pressure 126/86, pulse 69, temperature 97.8 F (36.6 C), temperature source Oral, resp. rate 13, height  (1.727 m), weight 60.3 kg (132 lb 15 oz), SpO2 93 %.  Intake/Output Summary (Last 24 hours) at 03/25/16 1636 Last data filed at 03/25/16 1100  Gross per 24 hour  Intake 944.24 ml  Output    830 ml  Net 114.24 ml   Exam: General: No acute respiratory  distress - more alert but noncommunicative Lungs: Clear to auscultation bilaterally Cardiovascular: Regular rate and rhythm  Abdomen: Nondistended, soft, bowel sounds positive, no ascites, no appreciable mass Extremities: No significant cyanosis, clubbing, or edema bilateral lower extremities  Data Reviewed:  Basic Metabolic Panel:  Recent Labs Lab 03/21/16 0322 03/22/16 0443 03/23/16 0322 03/24/16 0458  03/25/16 0428  NA 150* 150* 148* 145 146*  K 4.3 4.2 4.3 3.7 4.0  CL 120* 117* 117* 112* 114*  CO2 21* 23 18* 17* 19*  GLUCOSE 59* 81 124* 121* 84  BUN 24* 16 20 22* 27*  CREATININE 1.39* 1.67* 1.83* 1.66* 1.51*  CALCIUM 8.0* 8.7* 8.8* 8.5* 8.6*  MG 1.7  --  1.8  --   --   PHOS 4.1  --   --   --  4.0    CBC:  Recent Labs Lab 03/20/16 1800 03/21/16 0322 03/22/16 0443 03/23/16 0322 03/24/16 0458 03/25/16 0428  WBC 5.6 6.2 5.3 8.8 14.8* 12.9*  NEUTROABS 3.1  --   --  7.3 13.1*  --   HGB 11.5* 10.3* 10.7* 10.8* 10.2* 10.5*  HCT 35.9* 32.8* 32.2* 34.0* 31.7* 30.9*  MCV 92.8 91.9 92.0 89.7 89.5 87.5  PLT 336 307 243 275 263 224    Liver Function Tests:  Recent Labs Lab 03/20/16 1800 03/23/16 0322 03/24/16 0458  AST ALT 40 32 30  ALKPHOS 69 62 61  BILITOT 0.5 0.3 0.2*  PROT 6.8 6.2* 5.7*  ALBUMIN 2.9* 2.1* 2.1*    Recent Labs Lab 03/24/16 0458  AMMONIA 43*   Cardiac Enzymes:  Recent Labs Lab 03/21/16 0322 03/21/16 1240  TROPONINI <0.03 <0.03    CBG:  Recent Labs Lab 03/24/16 2337 03/25/16 0429 03/25/16 0806 03/25/16 1145 03/25/16 1540  GLUCAP 99 94 97 113* 104*    Recent Results (from the past 240 hour(s))  Blood culture (routine x 2)     Status: None   Collection Time: 03/20/16  6:00 PM  Result Value Ref Range Status   Specimen Description BLOOD RIGHT ARM  Final   Special Requests BOTTLES DRAWN AEROBIC AND ANAEROBIC 5CC  Final   Culture NO GROWTH 5 DAYS  Final   Report Status 03/25/2016 FINAL  Final  Blood culture (routine x 2)     Status: None   Collection Time: 03/20/16  6:10 PM  Result Value Ref Range Status   Specimen Description BLOOD RIGHT HAND  Final   Special Requests BOTTLES DRAWN AEROBIC AND ANAEROBIC 5CC  Final   Culture NO GROWTH 5 DAYS  Final   Report Status 03/25/2016 FINAL  Final  CSF culture     Status: None   Collection Time: 03/20/16 10:12 PM  Result Value Ref Range Status   Specimen Description CSF   Final   Special Requests TUBE 2  2CC  Final   Gram Stain   Final    CYTOSPIN SLIDE WBC PRESENT,BOTH PMN AND MONONUCLEAR NO ORGANISMS SEEN    Culture NO GROWTH 3 DAYS  Final   Report Status 03/24/2016 FINAL  Final  MRSA PCR Screening     Status: Abnormal   Collection Time: 03/20/16 11:00 PM  Result Value Ref Range Status   MRSA by PCR POSITIVE (A) NEGATIVE Final    Comment:        The GeneXpert MRSA Assay (FDA approved for NASAL specimens only), is one component of a comprehensive MRSA colonization surveillance program. It is not intended to diagnose MRSA  infection nor to guide or monitor treatment for MRSA infections. RESULT CALLED TO, READ BACK BY AND VERIFIED WITH: DAVIS,E RN 0214 03/21/16 MITCHELL,L      Studies:   Recent x-ray studies have been reviewed in detail by the Attending Physician  Scheduled Meds:  Scheduled Meds: . antiseptic oral rinse  7 mL Mouth Rinse q12n4p  . aspirin EC  325 mg Oral Daily  . cefTRIAXone (ROCEPHIN)  IV  2 g Intravenous Q12H  . chlorhexidine  15 mL Mouth Rinse BID  . enoxaparin (LOVENOX) injection  40 mg Subcutaneous Q24H  . famotidine  20 mg Per Tube Daily  . feeding supplement (PRO-STAT SUGAR FREE 64)  30 mL Per Tube Daily  . folic acid  1 mg Per Tube Daily  . levETIRAcetam  500 mg Intravenous Q12H  . thiamine  100 mg Per Tube Daily  . valproate sodium  500 mg Intravenous Q8H  . vancomycin  1,000 mg Intravenous Q24H    Time spent on care of this patient: 35 mins   Randee Huston T , MD   [facility] Office  [phone] Pager - Text Page per Loretha StaplerAmion as per below:  On-Call/Text Page:      [email or web address]      password [email or web address]  If 7PM-7AM, please contact night-coverage [email or web address] Password [email or web address] 03/25/2016, 4:36 PM   LOS: 5 days

## 2016-03-25 NOTE — Progress Notes (Signed)
Spoke with wife via phone. Update given. Encouraged spouse to keep phone close for MD call. Will continue to monitor.

## 2016-03-25 NOTE — Procedures (Signed)
History: Dale [name] is an 50 y.o. male patient with altered mental status seizures seizure like spells Routine inpatient EEG was performed for further evaluation.   Patient Active Problem List   Diagnosis Date Noted  . Cerebral thrombosis with cerebral infarction 03/25/2016  . Malnutrition of moderate degree 03/24/2016  . Severe sepsis (HCC)   . Meningitis, pneumococcal, recurrent   . Systolic and diastolic CHF, chronic (HCC)   . Acute renal failure (HCC)   . Hypernatremia   . Hypothermia 03/20/2016  . NSTEMI (non-ST elevated myocardial infarction) (HCC) 03/14/2016  . Physical deconditioning 03/14/2016  . Tracheostomy status (HCC)   . Encounter for palliative care   . Goals of care, counseling/discussion   . Tracheostomy care (HCC)   . Respiratory failure (HCC) 03/03/2016  . Acute respiratory failure with hypoxia (HCC)   . Septic shock (HCC)   . Cardiogenic shock (HCC)   . Acute systolic heart failure (HCC)   . Cardiac arrest (HCC)   . Hydrocephalus   . Altered mental status   . CHF (congestive heart failure) (HCC)   . 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 Homeless 02/16/2016  . Meningitis, streptococcal 02/16/2016  . Tobacco abuse 02/16/2016  . AKI (acute kidney injury) (HCC) 02/16/2016  . Gram positive sepsis (HCC) 02/16/2016  . Encephalopathy, metabolic 02/16/2016  . Underweight 02/16/2016  . ETOH abuse   . Substance abuse   . Acute encephalopathy   . Acute respiratory failure (HCC)      Current facility-administered medications:  .  0.45 % sodium chloride infusion, , Intravenous, Continuous, Lonia BloodJeffrey T McClung, MD, Last Rate: 10 mL/hr at 03/24/16 1800 .  antiseptic oral rinse (CPC / CETYLPYRIDINIUM CHLORIDE 0.05%) solution 7 mL, 7 mL, Mouth Rinse, q12n4p, Drema Dallasurtis J Woods, MD, 7 mL at 03/24/16 1547 .  aspirin EC tablet 325 mg, 325 mg, Oral, Daily, Laiylah Roettger Daniel NonesNarayan Kaveer Ozie Lupe, MD .  cefTRIAXone (ROCEPHIN) 2 g in dextrose 5 % 50 mL IVPB, 2 g, Intravenous, Q12H,  Stevphen RochesterJames L Ledford, RPH, 2 g at 03/24/16 2206 .  chlorhexidine (PERIDEX) 0.12 % solution 15 mL, 15 mL, Mouth Rinse, BID, Drema Dallasurtis J Woods, MD, 15 mL at 03/24/16 2227 .  Chlorhexidine Gluconate Cloth 2 % PADS 6 each, 6 each, Topical, Q0600, Chilton GreathousePraveen Mannam, MD, 6 each at 03/24/16 1000 .  enoxaparin (LOVENOX) injection 40 mg, 40 mg, Subcutaneous, Q24H, Orville GovernSarah Ellen Stephens, MD, 40 mg at 03/24/16 2226 .  famotidine (PEPCID) 40 MG/5ML suspension 20 mg, 20 mg, Per Tube, Daily, Lonia BloodJeffrey T McClung, MD, 20 mg at 03/24/16 1700 .  feeding supplement (JEVITY 1.2 CAL) liquid 1,000 mL, 1,000 mL, Per Tube, Continuous, Lonia BloodJeffrey T McClung, MD, Last Rate: 65 mL/hr at 03/25/16 0829, 1,000 mL at 03/25/16 0829 .  feeding supplement (PRO-STAT SUGAR FREE 64) liquid 30 mL, 30 mL, Per Tube, Daily, Lonia BloodJeffrey T McClung, MD, 30 mL at 03/24/16 1624 .  folic acid (FOLVITE) tablet 1 mg, 1 mg, Per Tube, Daily, Lonia BloodJeffrey T McClung, MD, 1 mg at 03/24/16 1700 .  levETIRAcetam (KEPPRA) 500 mg in sodium chloride 0.9 % 100 mL IVPB, 500 mg, Intravenous, Q12H, Constantinos Krempasky Daniel NonesNarayan Kaveer Weyman Bogdon, MD, 500 mg at 03/25/16 0825 .  methylPREDNISolone sodium succinate (SOLU-MEDROL) 125 mg/2 mL injection 125 mg, 125 mg, Intravenous, 3 times per day, Joscelin Fray Daniel NonesNarayan Kaveer Tyrrell Stephens, MD, 125 mg at 03/25/16 0528 .  mupirocin ointment (BACTROBAN) 2 % 1 application, 1 application, Nasal, BID, Chilton GreathousePraveen Mannam, MD, 1 application at 03/24/16 2212 .  thiamine (  VITAMIN B-1) tablet 100 mg, 100 mg, Per Tube, Daily, Lonia Blood, MD, 100 mg at 03/24/16 1700 .  valproate (DEPACON) 500 mg in dextrose 5 % 50 mL IVPB, 500 mg, Intravenous, Q8H, Dracen Reigle Daniel Nones, MD, 500 mg at 03/25/16 0528 .  vancomycin (VANCOCIN) IVPB 1000 mg/200 mL premix, 1,000 mg, Intravenous, Q24H, Gerilyn Nestle, RPH, 1,000 mg at 03/24/16 1625   Introduction:  This is a 19 channel routine scalp EEG performed at the bedside with bipolar and monopolar montages arranged in accordance to the international  10/20 system of electrode placement. One channel was dedicated to EKG recording.   Recording started on 03/23/16 at 1050, ended at 1502, total of 4:12 hrs of recording.   Findings:  Generalized low amplitude background delta slowing is noted throughout the EEG recording, without definite evidence of any abnormal epileptiform discharges or electrographic seizures were noted during this recording.   Impression:  Abnormal routine inpatient EEG suggestive of moderate to severe encephalopathy from generalized cerebral dysfunction. Clinical correlation is recommended .

## 2016-03-25 NOTE — Progress Notes (Signed)
Peripherally Inserted Central Catheter/Midline Placement  The IV Nurse has discussed with the patient and/or persons authorized to consent for the patient, the purpose of this procedure and the potential benefits and risks involved with this procedure.  The benefits include less needle sticks, lab draws from the catheter and patient may be discharged home with the catheter.  Risks include, but not limited to, infection, bleeding, blood clot (thrombus formation), and puncture of an artery; nerve damage and irregular heat beat.  Alternatives to this procedure were also discussed.  Telephone consent obtained from wife due to altered mental status.  PICC/Midline Placement Documentation        Darleth Eustache, Lajean ManesKerry Loraine 03/25/2016, 5:53 PM

## 2016-03-26 DIAGNOSIS — E440 Moderate protein-calorie malnutrition: Secondary | ICD-10-CM

## 2016-03-26 DIAGNOSIS — R770 Abnormality of albumin: Secondary | ICD-10-CM

## 2016-03-26 DIAGNOSIS — A419 Sepsis, unspecified organism: Secondary | ICD-10-CM | POA: Insufficient documentation

## 2016-03-26 LAB — COMPREHENSIVE METABOLIC PANEL
ALBUMIN: 1.9 g/dL — AB (ref 3.5–5.0)
ALK PHOS: 58 U/L (ref 38–126)
ALT: 30 U/L (ref 17–63)
AST: 19 U/L (ref 15–41)
Anion gap: 12 (ref 5–15)
BUN: 27 mg/dL — ABNORMAL HIGH (ref 6–20)
CALCIUM: 8.4 mg/dL — AB (ref 8.9–10.3)
CHLORIDE: 113 mmol/L — AB (ref 101–111)
CO2: 23 mmol/L (ref 22–32)
CREATININE: 1.41 mg/dL — AB (ref 0.61–1.24)
GFR calc non Af Amer: 56 mL/min — ABNORMAL LOW (ref >60)
GLUCOSE: 104 mg/dL — AB (ref 65–99)
Potassium: 3.6 mmol/L (ref 3.5–5.1)
SODIUM: 148 mmol/L — AB (ref 135–145)
Total Bilirubin: 0.3 mg/dL (ref 0.3–1.2)
Total Protein: 5.3 g/dL — ABNORMAL LOW (ref 6.5–8.1)

## 2016-03-26 LAB — CBC
HCT: 30.6 % — ABNORMAL LOW (ref 39.0–52.0)
Hemoglobin: 10 g/dL — ABNORMAL LOW (ref 13.0–17.0)
MCH: 29.2 pg (ref 26.0–34.0)
MCHC: 32.7 g/dL (ref 30.0–36.0)
MCV: 89.2 fL (ref 78.0–100.0)
PLATELETS: 240 10*3/uL (ref 150–400)
RBC: 3.43 MIL/uL — AB (ref 4.22–5.81)
RDW: 14.5 % (ref 11.5–15.5)
WBC: 10.8 10*3/uL — ABNORMAL HIGH (ref 4.0–10.5)

## 2016-03-26 LAB — GLUCOSE, CAPILLARY
GLUCOSE-CAPILLARY: 93 mg/dL (ref 65–99)
GLUCOSE-CAPILLARY: 95 mg/dL (ref 65–99)
Glucose-Capillary: 106 mg/dL — ABNORMAL HIGH (ref 65–99)
Glucose-Capillary: 108 mg/dL — ABNORMAL HIGH (ref 65–99)
Glucose-Capillary: 99 mg/dL (ref 65–99)

## 2016-03-26 MED ORDER — LEVETIRACETAM 100 MG/ML PO SOLN
500.0000 mg | Freq: Two times a day (BID) | ORAL | Status: DC
  Administered 2016-03-26 – 2016-04-05 (×19): 500 mg
  Filled 2016-03-26 (×31): qty 5

## 2016-03-26 MED ORDER — VALPROATE SODIUM 250 MG/5ML PO SYRP
250.0000 mg | ORAL_SOLUTION | Freq: Three times a day (TID) | ORAL | Status: DC
  Administered 2016-03-26 – 2016-04-05 (×28): 250 mg
  Filled 2016-03-26 (×36): qty 5

## 2016-03-26 MED ORDER — FREE WATER
200.0000 mL | Status: DC
  Administered 2016-03-26 – 2016-03-30 (×21): 200 mL

## 2016-03-26 NOTE — Progress Notes (Signed)
INFECTIOUS DISEASE PROGRESS NOTE  ID: Dale [name] is a 50 y.o. male with  Active Problems:   Hypothermia   Severe sepsis (HCC)   Meningitis, pneumococcal, recurrent   Systolic and diastolic CHF, chronic (HCC)   Acute renal failure (HCC)   Hypernatremia   Malnutrition of moderate degree   Cerebral thrombosis with cerebral infarction   Meningoencephalitis   Encephalopathy acute   Bacterial meningitis   Acute ischemic stroke (HCC)  Subjective: No response  Abtx:  Anti-infectives    Start     Dose/Rate Route Frequency Ordered Stop   03/24/16 1600  vancomycin (VANCOCIN) IVPB 1000 mg/200 mL premix     1,000 mg 200 mL/hr over 60 Minutes Intravenous Every 24 hours 03/24/16 1124     03/21/16 1000  vancomycin (VANCOCIN) 500 mg in sodium chloride 0.9 % 100 mL IVPB  Status:  Discontinued     500 mg 100 mL/hr over 60 Minutes Intravenous Every 12 hours 03/20/16 1940 03/24/16 1113   03/21/16 0800  cefTRIAXone (ROCEPHIN) 2 g in dextrose 5 % 50 mL IVPB     2 g 100 mL/hr over 30 Minutes Intravenous Every 12 hours 03/21/16 0040     03/20/16 2000  vancomycin (VANCOCIN) IVPB 1000 mg/200 mL premix     1,000 mg 200 mL/hr over 60 Minutes Intravenous NOW 03/20/16 1940 03/20/16 2103   03/20/16 1930  cefTRIAXone (ROCEPHIN) 2 g in dextrose 5 % 50 mL IVPB     2 g 100 mL/hr over 30 Minutes Intravenous  Once 03/20/16 1919 03/20/16 2030      Medications:  Scheduled: . antiseptic oral rinse  7 mL Mouth Rinse q12n4p  . aspirin  325 mg Per Tube Daily  . cefTRIAXone (ROCEPHIN)  IV  2 g Intravenous Q12H  . chlorhexidine  15 mL Mouth Rinse BID  . enoxaparin (LOVENOX) injection  40 mg Subcutaneous Q24H  . famotidine  20 mg Per Tube Daily  . feeding supplement (PRO-STAT SUGAR FREE 64)  30 mL Per Tube Daily  . folic acid  1 mg Per Tube Daily  . free water  200 mL Per Tube Q8H  . levETIRAcetam  500 mg Intravenous Q12H  . methylPREDNISolone (SOLU-MEDROL) injection  125 mg Intravenous Q8H  . sodium  chloride flush  10-40 mL Intracatheter Q12H  . thiamine  100 mg Per Tube Daily  . valproate sodium  500 mg Intravenous Q8H  . vancomycin  1,000 mg Intravenous Q24H    Objective: Vital signs in last 24 hours: Temp:  [97.5 F (36.4 C)-97.9 F (36.6 C)] 97.5 F (36.4 C) (04/22 0741) Pulse Rate:  [56-69] 60 (04/22 0741) Resp:  [11-19] 19 (04/22 0741) BP: (116-132)/(77-96) 132/88 mmHg (04/22 0741) SpO2:  [93 %-100 %] 100 % (04/22 0741) Weight:  [62 kg (136 lb 11 oz)] 62 kg (136 lb 11 oz) (04/22 0258)   General appearance: no distress Resp: clear to auscultation bilaterally Cardio: regular rate and rhythm GI: normal findings: bowel sounds normal and soft, non-tender  Lab Results  Recent Labs  03/25/16 0428 03/26/16 0315  WBC 12.9* 10.8*  HGB 10.5* 10.0*  HCT 30.9* 30.6*  NA 146* 148*  K 4.0 3.6  CL 114* 113*  CO2 19* 23  BUN 27* 27*  CREATININE 1.51* 1.41*   Liver Panel  Recent Labs  03/24/16 0458 03/26/16 0315  PROT 5.7* 5.3*  ALBUMIN 2.1* 1.9*  AST 15 19  ALT 30 30  ALKPHOS 61 58  BILITOT 0.2*  0.3   Sedimentation Rate No results for input(s): ESRSEDRATE in the last 72 hours. C-Reactive Protein No results for input(s): CRP in the last 72 hours.  Microbiology: Recent Results (from the past 240 hour(s))  Blood culture (routine x 2)     Status: None   Collection Time: 03/20/16  6:00 PM  Result Value Ref Range Status   Specimen Description BLOOD RIGHT ARM  Final   Special Requests BOTTLES DRAWN AEROBIC AND ANAEROBIC 5CC  Final   Culture NO GROWTH 5 DAYS  Final   Report Status 03/25/2016 FINAL  Final  Blood culture (routine x 2)     Status: None   Collection Time: 03/20/16  6:10 PM  Result Value Ref Range Status   Specimen Description BLOOD RIGHT HAND  Final   Special Requests BOTTLES DRAWN AEROBIC AND ANAEROBIC 5CC  Final   Culture NO GROWTH 5 DAYS  Final   Report Status 03/25/2016 FINAL  Final  CSF culture     Status: None   Collection Time: 03/20/16  10:12 PM  Result Value Ref Range Status   Specimen Description CSF  Final   Special Requests TUBE 2  2CC  Final   Gram Stain   Final    CYTOSPIN SLIDE WBC PRESENT,BOTH PMN AND MONONUCLEAR NO ORGANISMS SEEN    Culture NO GROWTH 3 DAYS  Final   Report Status 03/24/2016 FINAL  Final  MRSA PCR Screening     Status: Abnormal   Collection Time: 03/20/16 11:00 PM  Result Value Ref Range Status   MRSA by PCR POSITIVE (A) NEGATIVE Final    Comment:        The GeneXpert MRSA Assay (FDA approved for NASAL specimens only), is one component of a comprehensive MRSA colonization surveillance program. It is not intended to diagnose MRSA infection nor to guide or monitor treatment for MRSA infections. RESULT CALLED TO, READ BACK BY AND VERIFIED WITH: DAVIS,E RN 0214 03/21/16 MITCHELL,L     Studies/Results: Mr Brain Wo Contrast  03/24/2016  CLINICAL DATA:  History of streptococcal pneumonia 02/16/2016. Stroke. EXAM: MRI HEAD WITHOUT CONTRAST TECHNIQUE: Multiplanar, multiecho pulse sequences of the brain and surrounding structures were obtained without intravenous contrast. COMPARISON:  CT 03/22/2016, MRI 03/21/2016 FINDINGS: New area of acute infarct in the left corona radiata measuring 10 x 15 mm with restricted diffusion. Patchy areas of increased signal in the basal ganglia left greater than right on diffusion-weighted imaging show mild interval improvement. Restricted diffusion in the right parietal cortex also shows improvement since the prior MRI. These areas in the basal ganglia showed peripheral enhancement on the prior study. Contrast was not administered today. Small amount of hemorrhage in the left external capsule. Small amount of blood in the left frontal lobe. Small amount of blood products in the occipital horns bilaterally. These findings were present on the prior MRI of 03/21/2016 however were not well visualized or appreciated due to motion. The these areas are not identified is  hyperdense on CT. Ventricle size normal. No shift of the midline structures. No fluid collection. Minimal mucosal edema in the paranasal sinuses. Bilateral mastoid sinus effusion. Normal orbit. Normal pituitary. Normal skullbase. IMPRESSION: New area of acute infarct in the left corona radiata. Multiple areas of restricted diffusion in the basal ganglia left greater than right show mild interval improvement since the prior MRI suggesting late subacute infarct. Improvement in restricted diffusion in the right parietal cortex. Small amount of hemorrhage in the left external capsule. Mild amount of  intraventricular hemorrhage. These are unchanged from the MRI of 03/21/2016 although were not appreciated on the prior study due to motion. These results were called by telephone at the time of interpretation on 03/24/2016 at 1:36 pm to [facility]DAVID SMITH PA , who verbally acknowledged these results. Electronically Signed   By: Marlan Palauharles  Clark M.D.   On: 03/24/2016 13:37   Dg Abd Portable 1v  03/25/2016  CLINICAL DATA:  50 year old male with a history of nasogastric tube. New placement after displacement. EXAM: PORTABLE ABDOMEN - 1 VIEW COMPARISON:  03/23/2016 FINDINGS: Metallic tip enteric feeding tube projects over the mediastinum, coiled within the stomach lumen, directed toward the cardia of the stomach. Unremarkable appearance of lung bases. Unremarkable bowel gas pattern. IMPRESSION: Plain film of the upper abdomen demonstrates weighted metallic tip feeding tube terminating within the stomach, with the tip terminating towards the cardia. Signed, Yvone NeuJaime S. Loreta AveWagner, DO [facility] Electronically Signed   By: Gilmer MorJaime  Wagner D.O.   On: 03/25/2016 11:44     Assessment/Plan: Recurrent meningitis ?brain abscess Worsening Cr Worsening Alb  Total days of antibiotics: 6 ceftriaxone/vanco  No response on steroids Tube feeds Palliative f/u, family meeting? Continue  to watch Available as needed.          Johny SaxJeffrey Emiyah Spraggins Infectious Diseases (pager) [phone] [email or web address] 03/26/2016, 11:47 AM  LOS: 6 days

## 2016-03-26 NOTE — Progress Notes (Signed)
[facility] TEAM 1 - Stepdown/ICU TEAM PROGRESS NOTE  Dale HollowCraig D Vandevoorde ZOX:[id number] DOB: 07/06/1966 DOA: 03/20/2016 PCP: No primary care provider on file.  Admit HPI / Brief Narrative: 50 yo M with hx homelessness, admitted 3-14 to 03-10-16 with streptococcal (CSF Pneumococcus R-PEN) meningitis. His course was complicated by v-fib and respiratory arrest, as well as ARF due to ATN. He received 14 days of ceftriaxone, and weekly IM Pen G (for positive RPR 1:2). He was d/c to SNF. Non-verbal. At SNF his mental status was stable.   He returned to the ED on 4-16 after being found down. He was hypothermic. He was started on vanco/ceftriaxone. He had repeat LP showing 400 WBC (75%N), Prot > 600, Glc 37. He had solumedrol added. His CT head suggested subacute infarcts of basal ganglia. MRI also showed these and suggested them as residual from his previous meningitis. (per Dr. Ninetta LightsHatcher)  HPI/Subjective: Pt remains non communicative.  There is no family present in his room.  He does not appear to be in signif pain or acute resp distress. He is staring in the direction of the TV.     Assessment/Plan:  Severe sepsis - Recurrent meningitis +/- SBE -empiric coverage with vanc + rocephin continues  -ID following -will need TEE to r/o SBE (plan for next week) -pneumococcus isolated from his blood and CSF on 02/16/16 -will need long term abx per ID therefore PICC placed   Acute encephalopathy / L gaze preference / B basal ganglia abnormality / L corona radiata infarct -encephalopathy multifactorial to include severe sepsis, subacute infarct -Neuro directing care of this issue and feels is due to recurrent meningitis/inflammatory exudates from recurrent streptococcal meningitis involving deep penetrating arteries - Neuro suggests steroid + abx   Chronic Systolic and Diastolic CHF  -EF 45-50% on TTE 4/18 (improved since March) - no evidence of signif volume overload at this time - wgt at d/c 4/11 ~56kg - no signif  volume overload at this time  [facility]   03/24/16 0352 03/25/16 0436 03/26/16 0258  Weight: 61 kg (134 lb 7.7 oz) 60.3 kg (132 lb 15 oz) 62 kg (136 lb 11 oz)   Acute renal failure -multifactorial to include sepsis, heart failure, dehydration - crt peaked at 1.83 - continues to improve slowly   Recent Labs Lab 03/22/16 0443 03/23/16 0322 03/24/16 0458 03/25/16 0428 03/26/16 0315  CREATININE 1.67* 1.83* 1.66* 1.51* 1.41*   Hypernatremia -due to true dehydration - increase free water via NG   Recent Labs Lab 03/22/16 0443 03/23/16 0322 03/24/16 0458 03/25/16 0428 03/26/16 0315  NA 150* 148* 145 146* 148*   Hypothermia -resolved   Non-severe (moderate) malnutrition in context of chronic illness -CoreTrack placed for feeding - cont tube feeds and free water  MRSA Screen +  Code Status: FULL Family Communication: no family contact today  Disposition Plan: SDU  Consultants: Neurology  ID  Procedures: 4/18 TTE - EF 45-50% - diffuse hypokinesis - grade 1 diastolic dysfunction  Antibiotics: Vancomycin 4/16 > Rocephin 4/16 >  DVT prophylaxis: lovenox   Objective: Blood pressure 119/81, pulse 63, temperature 97.5 F (36.4 C), temperature source Axillary, resp. rate 16, height 5\' 8"  (1.727 m), weight 62 kg (136 lb 11 oz), SpO2 100 %.  Intake/Output Summary (Last 24 hours) at 03/26/16 1409 Last data filed at 03/26/16 1200  Gross per 24 hour  Intake 2581.25 ml  Output   1835 ml  Net 746.25 ml   Exam: General: No acute respiratory distress  Lungs: Clear to auscultation bilaterally Cardiovascular: Regular rate and rhythm - no M Abdomen: Nondistended, soft, bowel sounds positive, no ascites, no appreciable mass Extremities: No significant cyanosis, clubbing, edema bilateral lower extremities  Data Reviewed:  Basic Metabolic Panel:  Recent Labs Lab 03/21/16 0322 03/22/16 0443 03/23/16 0322 03/24/16 0458 03/25/16 0428 03/26/16 0315  NA 150* 150*  148* 145 146* 148*  K 4.3 4.2 4.3 3.7 4.0 3.6  CL 120* 117* 117* 112* 114* 113*  CO2 21* 23 18* 17* 19* 23  GLUCOSE 59* 81 124* 121* 84 104*  BUN 24* 16 20 22* 27* 27*  CREATININE 1.39* 1.67* 1.83* 1.66* 1.51* 1.41*  CALCIUM 8.0* 8.7* 8.8* 8.5* 8.6* 8.4*  MG 1.7  --  1.8  --   --   --   PHOS 4.1  --   --   --  4.0  --     CBC:  Recent Labs Lab 03/20/16 1800  03/22/16 0443 03/23/16 0322 03/24/16 0458 03/25/16 0428 03/26/16 0315  WBC 5.6  < > 5.3 8.8 14.8* 12.9* 10.8*  NEUTROABS 3.1  --   --  7.3 13.1*  --   --   HGB 11.5*  < > 10.7* 10.8* 10.2* 10.5* 10.0*  HCT 35.9*  < > 32.2* 34.0* 31.7* 30.9* 30.6*  MCV 92.8  < > 92.0 89.7 89.5 87.5 89.2  PLT 336  < > 243 275 263 224 240  < > = values in this interval not displayed.  Liver Function Tests:  Recent Labs Lab 03/20/16 1800 03/23/16 0322 03/24/16 0458 03/26/16 0315  AST ALT 40 32 30 30  ALKPHOS 69 62 61 58  BILITOT 0.5 0.3 0.2* 0.3  PROT 6.8 6.2* 5.7* 5.3*  ALBUMIN 2.9* 2.1* 2.1* 1.9*    Recent Labs Lab 03/24/16 0458  AMMONIA 43*   Cardiac Enzymes:  Recent Labs Lab 03/21/16 0322 03/21/16 1240  TROPONINI <0.03 <0.03    CBG:  Recent Labs Lab 03/25/16 2041 03/25/16 2346 03/26/16 0300 03/26/16 0741 03/26/16 1137  GLUCAP 84 103* 93 106* 95    Recent Results (from the past 240 hour(s))  Blood culture (routine x 2)     Status: None   Collection Time: 03/20/16  6:00 PM  Result Value Ref Range Status   Specimen Description BLOOD RIGHT ARM  Final   Special Requests BOTTLES DRAWN AEROBIC AND ANAEROBIC 5CC  Final   Culture NO GROWTH 5 DAYS  Final   Report Status 03/25/2016 FINAL  Final  Blood culture (routine x 2)     Status: None   Collection Time: 03/20/16  6:10 PM  Result Value Ref Range Status   Specimen Description BLOOD RIGHT HAND  Final   Special Requests BOTTLES DRAWN AEROBIC AND ANAEROBIC 5CC  Final   Culture NO GROWTH 5 DAYS  Final   Report Status 03/25/2016 FINAL  Final    CSF culture     Status: None   Collection Time: 03/20/16 10:12 PM  Result Value Ref Range Status   Specimen Description CSF  Final   Special Requests TUBE 2  2CC  Final   Gram Stain   Final    CYTOSPIN SLIDE WBC PRESENT,BOTH PMN AND MONONUCLEAR NO ORGANISMS SEEN    Culture NO GROWTH 3 DAYS  Final   Report Status 03/24/2016 FINAL  Final  MRSA PCR Screening     Status: Abnormal   Collection Time: 03/20/16 11:00 PM  Result Value Ref Range Status  MRSA by PCR POSITIVE (A) NEGATIVE Final    Comment:        The GeneXpert MRSA Assay (FDA approved for NASAL specimens only), is one component of a comprehensive MRSA colonization surveillance program. It is not intended to diagnose MRSA infection nor to guide or monitor treatment for MRSA infections. RESULT CALLED TO, READ BACK BY AND VERIFIED WITH: DAVIS,E RN 0214 03/21/16 MITCHELL,L      Studies:   Recent x-ray studies have been reviewed in detail by the Attending Physician  Scheduled Meds:  Scheduled Meds: . antiseptic oral rinse  7 mL Mouth Rinse q12n4p  . aspirin  325 mg Per Tube Daily  . cefTRIAXone (ROCEPHIN)  IV  2 g Intravenous Q12H  . chlorhexidine  15 mL Mouth Rinse BID  . enoxaparin (LOVENOX) injection  40 mg Subcutaneous Q24H  . famotidine  20 mg Per Tube Daily  . feeding supplement (PRO-STAT SUGAR FREE 64)  30 mL Per Tube Daily  . folic acid  1 mg Per Tube Daily  . free water  200 mL Per Tube Q8H  . levETIRAcetam  500 mg Intravenous Q12H  . methylPREDNISolone (SOLU-MEDROL) injection  125 mg Intravenous Q8H  . sodium chloride flush  10-40 mL Intracatheter Q12H  . thiamine  100 mg Per Tube Daily  . valproate sodium  500 mg Intravenous Q8H  . vancomycin  1,000 mg Intravenous Q24H    Time spent on care of this patient: 25 mins   MCCLUNG,JEFFREY T , MD   [facility] Office  [phone] Pager - Text Page per Loretha Stapler as per below:  On-Call/Text Page:      [email or web address]      password [email or web address]  If  7PM-7AM, please contact night-coverage [email or web address] Password [email or web address] 03/26/2016, 2:09 PM   LOS: 6 days

## 2016-03-26 NOTE — Progress Notes (Signed)
STROKE TEAM PROGRESS NOTE   SUBJECTIVE (INTERVAL HISTORY) No family is at the bedside.  Patient is awake and eyes open, not verbal and not following commands, eyes left gaze preference. ID on board and pt on antibiotics.     OBJECTIVE Temp:  [97.5 F (36.4 C)-97.9 F (36.6 C)] 97.5 F (36.4 C) (04/22 0741) Pulse Rate:  [56-69] 60 (04/22 0741) Cardiac Rhythm:  [-] Sinus bradycardia (04/22 0258) Resp:  [11-19] 19 (04/22 0741) BP: (116-132)/(77-96) 132/88 mmHg (04/22 0741) SpO2:  [93 %-100 %] 100 % (04/22 0741) Weight:  [62 kg (136 lb 11 oz)] 62 kg (136 lb 11 oz) (04/22 0258)  CBC:   Recent Labs Lab 03/23/16 0322 03/24/16 0458 03/25/16 0428 03/26/16 0315  WBC 8.8 14.8* 12.9* 10.8*  NEUTROABS 7.3 13.1*  --   --   HGB 10.8* 10.2* 10.5* 10.0*  HCT 34.0* 31.7* 30.9* 30.6*  MCV 89.7 89.5 87.5 89.2  PLT 275 263 224 240    Basic Metabolic Panel:  Recent Labs Lab 03/21/16 0322  03/23/16 0322  03/25/16 0428 03/26/16 0315  NA 150*  < > 148*  < > 146* 148*  K 4.3  < > 4.3  < > 4.0 3.6  CL 120*  < > 117*  < > 114* 113*  CO2 21*  < > 18*  < > 19* 23  GLUCOSE 59*  < > 124*  < > 84 104*  BUN 24*  < > 20  < > 27* 27*  CREATININE 1.39*  < > 1.83*  < > 1.51* 1.41*  CALCIUM 8.0*  < > 8.8*  < > 8.6* 8.4*  MG 1.7  --  1.8  --   --   --   PHOS 4.1  --   --   --  4.0  --   < > = values in this interval not displayed.  Lipid Panel:     Component Value Date/Time   TRIG 54 02/16/2016 2016   HgbA1c: No results found for: HGBA1C Urine Drug Screen:     Component Value Date/Time   LABOPIA NONE DETECTED 02/16/2016 1140   COCAINSCRNUR NONE DETECTED 02/16/2016 1140   LABBENZ NONE DETECTED 02/16/2016 1140   AMPHETMU NONE DETECTED 02/16/2016 1140   THCU POSITIVE* 02/16/2016 1140   LABBARB NONE DETECTED 02/16/2016 1140      IMAGING I have personally reviewed the radiological images below and agree with the radiology interpretations.  Mr Brain Wo Contrast  03/24/2016  IMPRESSION:  New area of acute infarct in the left corona radiata. Multiple areas of restricted diffusion in the basal ganglia left greater than right show mild interval improvement since the prior MRI suggesting late subacute infarct. Improvement in restricted diffusion in the right parietal cortex. Small amount of hemorrhage in the left external capsule. Mild amount of intraventricular hemorrhage. These are unchanged from the MRI of 03/21/2016 although were not appreciated on the prior study due to motion.  Mr Laqueta Jean Wo Contrast  03/21/2016  IMPRESSION: Image quality degraded by significant motion. There is extensive abnormal enhancement in the basal ganglia bilaterally. Some of these areas show restricted diffusion left basal ganglia. The enhancement pattern and vascular territory suggest the enhancement is related to subacute infarct related to meningitis 1 month ago. Similar area of enhancement and restricted diffusion in the right parietal cortex. No intracranial hemorrhage.    EEG moderate to severe encephalopathy from generalized cerebral dysfunction  2D Echocardiogram  - Left ventricle: The cavity size was  normal. Wall thickness was normal. Systolic function was mildly reduced. The estimated ejection fraction was in the range of 45% to 50%. Diffuse hypokinesis. Doppler parameters are consistent with abnormal left ventricular relaxation (grade 1 diastolic dysfunction). - Mitral valve: There was mild regurgitation. - Right ventricle: Systolic function was mildly reduced. - Pericardium, extracardiac: A small pericardial effusion was identified along the right ventricular free wall. The fluid had no internal echoes.There was no evidence of hemodynamic compromise. Impressions:   When compared to prior ECHO, EF is reduced (prior 50%). There was  no evidence of a vegetation.  Ct Angio Head and neck W/cm &/or Wo Cm 03/22/2016  IMPRESSION: Circle of Willis patent without focal stenosis or branch occlusion.  Anterior and middle cerebral arteries show diffuse narrowing which may be due to basilar meningitis or vasospasm. This is a subjective findings and is diffuse. Negative CTA neck Subtle enhancement in the left basal ganglia and right parietal cortex corresponding to areas of abnormal enhancement on the MRI. 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   Ct Head Wo Contrast  03/20/2016  IMPRESSION: Interval development of hypodensities within the left-greater-than-right basal ganglia which may represent subacute infarcts. No acute cervical spine fracture.    PHYSICAL EXAM Physical exam  Temp:  [97.4 F (36.3 C)-97.9 F (36.6 C)] 97.4 F (36.3 C) (04/22 1510) Pulse Rate:  [53-63] 53 (04/22 1510) Resp:  [10-19] 10 (04/22 1510) BP: (119-132)/(77-90) 122/84 mmHg (04/22 1510) SpO2:  [97 %-100 %] 100 % (04/22 1510) Weight:  [136 lb 11 oz (62 kg)] 136 lb 11 oz (62 kg) (04/22 0258)  General - Well nourished, well developed, in no apparent distress.  Ophthalmologic - Fundi not visualized due to noncooperation.  Cardiovascular - Regular rate and rhythm.  Neuro - Awake, eyes open but non-verbal. Does not follow commands. Left gaze preference, barely cross midline on stimulation. Blinks to threat on the left but not the right. Pupils reactive. Right lower facial weakness. Tongue is midline. On pain stimulation, extension of left UE but withdraw of right UE, withdraw both legs to pain. DTR 1+, bilateral babinski positive. Antigravity on the BLE. Sensation, coordination and gait was not tested.  ASSESSMENT/PLAN Dale [name] is a 50 y.o. male found down at his nursing home with history of pneumococcal meningitis who in hospital developed orsening mental status left gaze preference. He did not receive IV TPA.  Stroke:  Left corona radiata acute infarct and bilateral BG punctate subacute infarcts due  to vasospasm vs. 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 Treatment is steroids and abx. Likely poor prognosis.  Resultant  encephalopic non verbal state with L gaze preference  MRI  Left corona radiata acute infarct, left greater than right subacute basal ganglia infarct. Small amount hemorrhage, left external capsule. Mild intraventricular hemorrhage  CTA head ACAs and MCAs with diffuse narrowing  CTA neck negative  2D Echo EF of 50%, no vegitation  EEG moderate to severe encephalopathy from generalized cerebral dysfunction  LDL pending  HgbA1c pending  Lovenox 40 mg sq daily for VTE prophylaxis Diet NPO time specified - on TF  aspirin 81 mg daily prior to admission, now on aspirin 325 mg daily   Sepsis, recurrent bacterial meningits  ID on board  CSF WBC 340 02/16/16 and 400 03/20/16  RPR positive but CSF VDRL negative   On rocephin, vanco and solumedrol  Also no keppra  Pending family meeting for   Other Stroke Risk Factors  Cigarette smoker  ETOH use  Marijuana use  Other Active Problems  Chronic systolic and diastolic CHF  Acute Renal failure  Hypernatremia - on free water  Hypothermia  Hospital day # 6  Marvel PlanJindong Mikalyn Hermida, MD PhD Stroke Neurology 03/26/2016 4:01 PM    To contact Stroke Continuity provider, please refer to [email or web address]. After hours, contact General Neurology

## 2016-03-27 DIAGNOSIS — I76 Septic arterial embolism: Secondary | ICD-10-CM

## 2016-03-27 DIAGNOSIS — I669 Occlusion and stenosis of unspecified cerebral artery: Secondary | ICD-10-CM | POA: Insufficient documentation

## 2016-03-27 LAB — AMMONIA: AMMONIA: 24 umol/L (ref 9–35)

## 2016-03-27 LAB — BASIC METABOLIC PANEL
ANION GAP: 13 (ref 5–15)
BUN: 27 mg/dL — ABNORMAL HIGH (ref 6–20)
CALCIUM: 8.3 mg/dL — AB (ref 8.9–10.3)
CO2: 26 mmol/L (ref 22–32)
Chloride: 108 mmol/L (ref 101–111)
Creatinine, Ser: 1.27 mg/dL — ABNORMAL HIGH (ref 0.61–1.24)
Glucose, Bld: 120 mg/dL — ABNORMAL HIGH (ref 65–99)
POTASSIUM: 3.7 mmol/L (ref 3.5–5.1)
Sodium: 147 mmol/L — ABNORMAL HIGH (ref 135–145)

## 2016-03-27 LAB — LIPID PANEL
CHOL/HDL RATIO: 3 ratio
CHOLESTEROL: 161 mg/dL (ref 0–200)
HDL: 53 mg/dL (ref >40)
LDL Cholesterol: 92 mg/dL (ref 0–99)
Triglycerides: 80 mg/dL (ref <150)
VLDL: 16 mg/dL (ref 0–40)

## 2016-03-27 LAB — GLUCOSE, CAPILLARY
GLUCOSE-CAPILLARY: 121 mg/dL — AB (ref 65–99)
GLUCOSE-CAPILLARY: 126 mg/dL — AB (ref 65–99)
Glucose-Capillary: 115 mg/dL — ABNORMAL HIGH (ref 65–99)
Glucose-Capillary: 107 mg/dL — ABNORMAL HIGH (ref 65–99)
Glucose-Capillary: 118 mg/dL — ABNORMAL HIGH (ref 65–99)
Glucose-Capillary: 110 mg/dL — ABNORMAL HIGH (ref 65–99)

## 2016-03-27 LAB — CBC
HCT: 31 % — ABNORMAL LOW (ref 39.0–52.0)
HEMOGLOBIN: 10.3 g/dL — AB (ref 13.0–17.0)
MCH: 29.5 pg (ref 26.0–34.0)
MCHC: 33.2 g/dL (ref 30.0–36.0)
MCV: 88.8 fL (ref 78.0–100.0)
Platelets: 234 10*3/uL (ref 150–400)
RBC: 3.49 MIL/uL — AB (ref 4.22–5.81)
RDW: 14.6 % (ref 11.5–15.5)
WBC: 15.9 10*3/uL — ABNORMAL HIGH (ref 4.0–10.5)

## 2016-03-27 LAB — VANCOMYCIN, TROUGH: Vancomycin Tr: 12 ug/mL (ref 10.0–20.0)

## 2016-03-27 MED ORDER — CARVEDILOL 3.125 MG PO TABS
3.1250 mg | ORAL_TABLET | Freq: Two times a day (BID) | ORAL | Status: DC
  Administered 2016-03-27 – 2016-04-05 (×17): 3.125 mg
  Filled 2016-03-27 (×19): qty 1

## 2016-03-27 MED ORDER — ATORVASTATIN CALCIUM 10 MG PO TABS
20.0000 mg | ORAL_TABLET | Freq: Every day | ORAL | Status: DC
  Administered 2016-03-27 – 2016-04-05 (×9): 20 mg via ORAL
  Filled 2016-03-27 (×10): qty 2

## 2016-03-27 MED ORDER — ALTEPLASE 2 MG IJ SOLR
2.0000 mg | Freq: Once | INTRAMUSCULAR | Status: AC
  Administered 2016-03-27: 2 mg
  Filled 2016-03-27: qty 2

## 2016-03-27 MED ORDER — CARVEDILOL 6.25 MG PO TABS: 6.2500 mg | ORAL_TABLET | Freq: Two times a day (BID) | ORAL | Status: DC

## 2016-03-27 MED ORDER — VANCOMYCIN HCL 500 MG IV SOLR
500.0000 mg | Freq: Two times a day (BID) | INTRAVENOUS | Status: DC
  Administered 2016-03-28: 500 mg via INTRAVENOUS
  Filled 2016-03-27: qty 500

## 2016-03-27 NOTE — Progress Notes (Signed)
Pt received at 12:12pm alert, nonverbal with not noted distress. Pt does not follow commands. No signs of pain or discomfort during assessment and repositioning. Pt on telemetry monitoring. Tube feeding in place Jevity 1.2 @65ml /hr. Foley patent, small amount noted clear, yellow urine to bag. Pt restless, attempting to remove NG and telemetry tubing. Safety measures in place. Call bell within reach. Will continue to monitor. Report received prior to transfer.

## 2016-03-27 NOTE — Progress Notes (Signed)
STROKE TEAM PROGRESS NOTE   SUBJECTIVE (INTERVAL HISTORY) No family is at the bedside.  Patient is awake and eyes open, not verbal and not following commands, eyes left gaze preference. ID on board and pt on antibiotics. Agree with taper off steroids.     OBJECTIVE Temp:  [97.4 F (36.3 C)-98.4 F (36.9 C)] 98.4 F (36.9 C) (04/23 1434) Pulse Rate:  [53-68] 68 (04/23 1434) Cardiac Rhythm:  [-] Sinus bradycardia (04/23 1220) Resp:  [10-16] 16 (04/23 1434) BP: (122-132)/(75-87) 126/81 mmHg (04/23 1434) SpO2:  [94 %-100 %] 96 % (04/23 1434) Weight:  [143 lb 4.8 oz (65 kg)] 143 lb 4.8 oz (65 kg) (04/23 0418)  CBC:   Recent Labs Lab 03/23/16 0322 03/24/16 0458  03/26/16 0315 03/27/16 0534  WBC 8.8 14.8*  < > 10.8* 15.9*  NEUTROABS 7.3 13.1*  --   --   --   HGB 10.8* 10.2*  < > 10.0* 10.3*  HCT 34.0* 31.7*  < > 30.6* 31.0*  MCV 89.7 89.5  < > 89.2 88.8  PLT 275 263  < > 240 234  < > = values in this interval not displayed.  Basic Metabolic Panel:  Recent Labs Lab 03/21/16 0322  03/23/16 0322  03/25/16 0428 03/26/16 0315 03/27/16 0534  NA 150*  < > 148*  < > 146* 148* 147*  K 4.3  < > 4.3  < > 4.0 3.6 3.7  CL 120*  < > 117*  < > 114* 113* 108  CO2 21*  < > 18*  < > 19* 23 26  GLUCOSE 59*  < > 124*  < > 84 104* 120*  BUN 24*  < > 20  < > 27* 27* 27*  CREATININE 1.39*  < > 1.83*  < > 1.51* 1.41* 1.27*  CALCIUM 8.0*  < > 8.8*  < > 8.6* 8.4* 8.3*  MG 1.7  --  1.8  --   --   --   --   PHOS 4.1  --   --   --  4.0  --   --   < > = values in this interval not displayed.  Lipid Panel:     Component Value Date/Time   CHOL 161 03/27/2016 0534   TRIG 80 03/27/2016 0534   HDL 53 03/27/2016 0534   CHOLHDL 3.0 03/27/2016 0534   VLDL 16 03/27/2016 0534   LDLCALC 92 03/27/2016 0534   HgbA1c: No results found for: HGBA1C Urine Drug Screen:     Component Value Date/Time   LABOPIA NONE DETECTED 02/16/2016 1140   COCAINSCRNUR NONE DETECTED 02/16/2016 1140   LABBENZ NONE  DETECTED 02/16/2016 1140   AMPHETMU NONE DETECTED 02/16/2016 1140   THCU POSITIVE* 02/16/2016 1140   LABBARB NONE DETECTED 02/16/2016 1140      IMAGING I have personally reviewed the radiological images below and agree with the radiology interpretations.  Dale Brain Wo Contrast  03/24/2016  IMPRESSION: New area of acute infarct in the left corona radiata. Multiple areas of restricted diffusion in the basal ganglia left greater than right show mild interval improvement since the prior MRI suggesting late subacute infarct. Improvement in restricted diffusion in the right parietal cortex. Small amount of hemorrhage in the left external capsule. Mild amount of intraventricular hemorrhage. These are unchanged from the MRI of 03/21/2016 although were not appreciated on the prior study due to motion.  Dale Laqueta JeanBrain W Wo Contrast  03/21/2016  IMPRESSION: Image quality degraded by significant motion.  There is extensive abnormal enhancement in the basal ganglia bilaterally. Some of these areas show restricted diffusion left basal ganglia. The enhancement pattern and vascular territory suggest the enhancement is related to subacute infarct related to meningitis 1 month ago. Similar area of enhancement and restricted diffusion in the right parietal cortex. No intracranial hemorrhage.    EEG moderate to severe encephalopathy from generalized cerebral dysfunction  2D Echocardiogram  - Left ventricle: The cavity size was normal. Wall thickness was normal. Systolic function was mildly reduced. The estimated ejection fraction was in the range of 45% to 50%. Diffuse hypokinesis. Doppler parameters are consistent with abnormal left ventricular relaxation (grade 1 diastolic dysfunction). - Mitral valve: There was mild regurgitation. - Right ventricle: Systolic function was mildly reduced. - Pericardium, extracardiac: A small pericardial effusion was identified along the right ventricular free wall. The fluid had no  internal echoes.There was no evidence of hemodynamic compromise. Impressions:   When compared to prior ECHO, EF is reduced (prior 50%). There was  no evidence of a vegetation.  Ct Angio Head and neck W/cm &/or Wo Cm 03/22/2016  IMPRESSION: Circle of Willis patent without focal stenosis or branch occlusion. Anterior and middle cerebral arteries show diffuse narrowing which may be due to basilar meningitis or vasospasm. This is a subjective findings and is diffuse. Negative CTA neck Subtle enhancement in the left basal ganglia and right parietal cortex corresponding to areas of abnormal enhancement on the MRI. 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   Ct Head Wo Contrast  03/20/2016  IMPRESSION: Interval development of hypodensities within the left-greater-than-right basal ganglia which may represent subacute infarcts. No acute cervical spine fracture.    PHYSICAL EXAM  Temp:  [97.4 F (36.3 C)-98.4 F (36.9 C)] 98.4 F (36.9 C) (04/23 1434) Pulse Rate:  [53-68] 68 (04/23 1434) Resp:  [10-16] 16 (04/23 1434) BP: (122-132)/(75-87) 126/81 mmHg (04/23 1434) SpO2:  [94 %-100 %] 96 % (04/23 1434) Weight:  [143 lb 4.8 oz (65 kg)] 143 lb 4.8 oz (65 kg) (04/23 0418)  General - Well nourished, well developed, in no apparent distress.  Ophthalmologic - Fundi not visualized due to noncooperation.  Cardiovascular - Regular rate and rhythm.  Neuro - Awake, eyes open but non-verbal. Does not follow commands. Left gaze preference, barely cross midline on stimulation. Blinks to threat on the left but not the right. Pupils reactive. Right lower facial weakness. Tongue is midline. On pain stimulation, extension of left UE but withdraw of right UE, withdraw both legs to pain. DTR 1+, bilateral babinski positive. Antigravity on the BLE. Sensation, coordination and gait was not  tested.  ASSESSMENT/PLAN Dale. MAXIMILLIAN [name] is a 50 y.o. male found down at his nursing home with history of pneumococcal meningitis who in hospital developed orsening mental status left gaze preference. He did not receive IV TPA.  Stroke:  Left corona radiata acute infarct and bilateral BG punctate subacute infarcts due to vasospasm vs. 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 Treatment is abx.   Resultant  encephalopic non verbal state with L gaze preference  MRI  Left corona radiata acute infarct, left greater than right subacute basal ganglia infarct. Small amount hemorrhage, left external capsule. Mild intraventricular hemorrhage  CTA head ACAs and MCAs with diffuse narrowing  CTA neck negative  2D Echo EF of 50%, no vegitation  EEG moderate to severe encephalopathy from  generalized cerebral dysfunction  LDL 92  HgbA1c pending  Lovenox 40 mg sq daily for VTE prophylaxis Diet NPO time specified - on TF  aspirin 81 mg daily prior to admission, now on aspirin 325 mg daily.  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    The mainstay treatment for infectious cerebral vasculitis is still the antibiotics  Agree with steroids taper off at this time.  Continue keppra  Sepsis, recurrent bacterial meningits  ID on board  CSF WBC 340 02/16/16 and 400 03/20/16  RPR positive but CSF VDRL negative   On rocephin, vanco and solumedrol  Agree with TEE to rule out endocarditis.  Hyperlipidemia  No statin at home  LDL 92, goal < 70  On lipitor  now  Continue lipitor on discharge  Other Stroke Risk  Factors  Cigarette smoker  ETOH use  Marijuana use  Other Active Problems  Chronic systolic and diastolic CHF  Acute Renal failure  Hypernatremia - on free water  Hypothermia  Hospital day # 7  Neurology will sign off. Please call with questions. Pt will follow up with Dr. Roda Shutters at [facility] in about 2 months. Thanks for the consult.   Marvel Plan, MD PhD Stroke Neurology 03/27/2016 2:58 PM    To contact Stroke Continuity provider, please refer to [email or web address]. After hours, contact General Neurology

## 2016-03-27 NOTE — Progress Notes (Addendum)
Pharmacy Antibiotic Note  [age] YOM with history of Strep pneumoniae meningitis to continue on vancomycin and ceftriaxone for recurrent meningitis.   Plan: - Change vanc to 500mg  IV Q12h starting tomorrow at 0500 (12h after dose that was just hung) - CTX 2g IV Q12H - Monitor renal fxn, clinical progress, repeat VT at new Css    Height: 5\' 8"  (172.7 cm) Weight: 143 lb 4.8 oz (65 kg) IBW/kg (Calculated) : 68.4  Temp (24hrs), Avg:97.8 F (36.6 C), Min:97.4 F (36.3 C), Max:98.4 F (36.9 C)   Recent Labs Lab 03/20/16 1824 03/20/16 2044  03/23/16 0322 03/24/16 0458 03/24/16 1041 03/25/16 0428 03/26/16 0315 03/27/16 0534 03/27/16 1535  WBC  --   --   < > 8.8 14.8*  --  12.9* 10.8* 15.9*  --   CREATININE  --   --   < > 1.83* 1.66*  --  1.51* 1.41* 1.27*  --   LATICACIDVEN 1.38 0.47*  --  1.2  --   --   --   --   --   --   VANCOTROUGH  --   --   --   --   --  22*  --   --   --  12  < > = values in this interval not displayed.  Estimated Creatinine Clearance: 63.3 mL/min (by C-G formula based on Cr of 1.27).    No Known Allergies  Antimicrobials this admission: Vanc 4/16 >> CTX 4/16 >>   Dose adjustments this admission: 4/20 VT = 22 mcg/mL on 500mg  q12 (drawn 1 hr late, SCr 1.66) >> 1gm q24 4/23 VT = 12 on 1g q24h (Scr 1.2)  Microbiology results: 4/16 BCx x2 - Neg 4/16 CSF - Neg 4/16 MRSA PCR - positive 3/14 PTA CSF - few Strep pneumo - got 14d CTX per ID (thru 03/01/15)  =======================   Addendum: - After reviewing patient's labs, will adjust vanc to 1500mg  IV Q24H and recheck VT at Css    Cadie Sorci D. Laney Potashang, PharmD, BCPS Pager:  [phone] 03/28/2016, 7:44 AM

## 2016-03-27 NOTE — Progress Notes (Signed)
[facility] TEAM 1 - Stepdown/ICU TEAM PROGRESS NOTE  Dale [name] ZOX:[id number] DOB: 1966-10-04 DOA: 03/20/2016 PCP: No primary care provider on file.  Admit HPI / Brief Narrative: 50 yo M with hx homelessness, admitted 3-14 to 03-10-16 with streptococcal (CSF Pneumococcus R-PEN) meningitis. His course was complicated by v-fib and respiratory arrest, as well as ARF due to ATN. He received 14 days of ceftriaxone, and weekly IM Pen G (for positive RPR 1:2). He was d/c to SNF. Non-verbal. At SNF his mental status was stable.   He returned to the ED on 4-16 after being found down. He was hypothermic. He was started on vanco/ceftriaxone. He had repeat LP showing 400 WBC (75%N), Prot > 600, Glc 37. He had solumedrol added. His CT head suggested subacute infarcts of basal ganglia. MRI also showed these and suggested them as residual from his previous meningitis. (per Dr. Ninetta Lights)  HPI/Subjective: Pt is sleeping soundly.  He does not open his eyes to touch or voice.  Pt remains non communicative.  There is no family present in his room.    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 - Neuro suggests steroid + abx   Chronic Systolic and Diastolic CHF  -EF 45-50% on TTE 4/18 (improved since March) - no evidence of signif volume overload at this time - wgt at d/c  4/11 ~56kg - no signif volume overload at this time - +~7L since admit  Filed Weights   03/25/16 0436 03/26/16 0258 03/27/16 0418  Weight: 60.3 kg (132 lb 15 oz) 62 kg (136 lb 11 oz) 65 kg (143 lb 4.8 oz)   Acute renal failure -multifactorial to include sepsis, heart failure, dehydration - crt peaked at 1.83 - continues to improve slowly   Recent Labs Lab 03/23/16 0322 03/24/16 0458 03/25/16 0428 03/26/16 0315 03/27/16 0534  CREATININE 1.83* 1.66* 1.51* 1.41* 1.27*    Hypernatremia -due to true dehydration - increased free water via NG - follow   Recent Labs Lab 03/23/16 0322 03/24/16 0458 03/25/16 0428 03/26/16 0315 03/27/16 0534  NA 148* 145 146* 148* 147*    Hypothermia -resolved   Non-severe (moderate) malnutrition in context of chronic illness -CoreTrack placed for feeding - cont tube feeds and free water  MRSA Screen +  Code Status: FULL Family Communication: no family contact today  Disposition Plan: stable for transfer to tele bed - call Cards on 4/24 to arrange TEE - consider Palliative Care consultation 4/24 to address goals of care   Consultants: Neurology  ID  Procedures: 4/18 TTE - EF 45-50% - diffuse hypokinesis - grade 1 diastolic dysfunction  Antibiotics: Vancomycin 4/16 > Rocephin 4/16 >  DVT prophylaxis: lovenox   Objective: Blood pressure 132/77, pulse 64, temperature 97.6 F (36.4 C), temperature source Oral, resp. rate 13, height  (1.727 m), weight 65 kg (143 lb 4.8 oz), SpO2 97 %.  Intake/Output Summary (Last 24 hours)  at 03/27/16 0930 Last data filed at 03/27/16 0759  Gross per 24 hour  Intake 2562.5 ml  Output   2410 ml  Net  152.5 ml   Exam: General: No acute respiratory distress - somnolent - non-communicative  Lungs: Clear to auscultation bilaterally - no wheeze  Cardiovascular: Regular rate and rhythm - no M or rub  Abdomen: Nondistended, soft, bowel sounds positive, no ascites, no mass Extremities: No  significant cyanosis, clubbing, or edema bilateral lower extremities  Data Reviewed:  Basic Metabolic Panel:  Recent Labs Lab 03/21/16 0322  03/23/16 0322 03/24/16 0458 03/25/16 0428 03/26/16 0315 03/27/16 0534  NA 150*  < > 148* 145 146* 148* 147*  K 4.3  < > 4.3 3.7 4.0 3.6 3.7  CL 120*  < > 117* 112* 114* 113* 108  CO2 21*  < > 18* 17* 19* 23 26  GLUCOSE 59*  < > 124* 121* 84 104* 120*  BUN 24*  < > 20 22* 27* 27* 27*  CREATININE 1.39*  < > 1.83* 1.66* 1.51* 1.41* 1.27*  CALCIUM 8.0*  < > 8.8* 8.5* 8.6* 8.4* 8.3*  MG 1.7  --  1.8  --   --   --   --   PHOS 4.1  --   --   --  4.0  --   --   < > = values in this interval not displayed.  CBC:  Recent Labs Lab 03/20/16 1800  03/23/16 0322 03/24/16 0458 03/25/16 0428 03/26/16 0315 03/27/16 0534  WBC 5.6  < > 8.8 14.8* 12.9* 10.8* 15.9*  NEUTROABS 3.1  --  7.3 13.1*  --   --   --   HGB 11.5*  < > 10.8* 10.2* 10.5* 10.0* 10.3*  HCT 35.9*  < > 34.0* 31.7* 30.9* 30.6* 31.0*  MCV 92.8  < > 89.7 89.5 87.5 89.2 88.8  PLT 336  < > 275 263 224 240 234  < > = values in this interval not displayed.  Liver Function Tests:  Recent Labs Lab 03/20/16 1800 03/23/16 0322 03/24/16 0458 03/26/16 0315  AST 28 19 15 19   ALT 40 32 30 30  ALKPHOS 69 62 61 58  BILITOT 0.5 0.3 0.2* 0.3  PROT 6.8 6.2* 5.7* 5.3*  ALBUMIN 2.9* 2.1* 2.1* 1.9*    Recent Labs Lab 03/24/16 0458 03/27/16 0536  AMMONIA 43* 24   Cardiac Enzymes:  Recent Labs Lab 03/21/16 0322 03/21/16 1240  TROPONINI <0.03 <0.03    CBG:  Recent Labs Lab 03/26/16 1610 03/26/16 2010 03/26/16 2344 03/27/16 0442 03/27/16 0811  GLUCAP 108* 99 121* 115* 126*    Recent Results (from the past 240 hour(s))  Blood culture (routine x 2)     Status: None   Collection Time: 03/20/16  6:00 PM  Result Value Ref Range Status   Specimen Description BLOOD RIGHT ARM  Final   Special Requests BOTTLES DRAWN AEROBIC AND ANAEROBIC 5CC  Final   Culture NO GROWTH 5 DAYS   Final   Report Status 03/25/2016 FINAL  Final  Blood culture (routine x 2)     Status: None   Collection Time: 03/20/16  6:10 PM  Result Value Ref Range Status   Specimen Description BLOOD RIGHT HAND  Final   Special Requests BOTTLES DRAWN AEROBIC AND ANAEROBIC 5CC  Final   Culture NO GROWTH 5 DAYS  Final   Report Status 03/25/2016 FINAL  Final  CSF culture     Status: None  Collection Time: 03/20/16 10:12 PM  Result Value Ref Range Status   Specimen Description CSF  Final   Special Requests TUBE 2  2CC  Final   Gram Stain   Final    CYTOSPIN SLIDE WBC PRESENT,BOTH PMN AND MONONUCLEAR NO ORGANISMS SEEN    Culture NO GROWTH 3 DAYS  Final   Report Status 03/24/2016 FINAL  Final  MRSA PCR Screening     Status: Abnormal   Collection Time: 03/20/16 11:00 PM  Result Value Ref Range Status   MRSA by PCR POSITIVE (A) NEGATIVE Final    Comment:        The GeneXpert MRSA Assay (FDA approved for NASAL specimens only), is one component of a comprehensive MRSA colonization surveillance program. It is not intended to diagnose MRSA infection nor to guide or monitor treatment for MRSA infections. RESULT CALLED TO, READ BACK BY AND VERIFIED WITH: DAVIS,E RN 0214 03/21/16 MITCHELL,L      Studies:   Recent x-ray studies have been reviewed in detail by the Attending Physician  Scheduled Meds:  Scheduled Meds: . antiseptic oral rinse  7 mL Mouth Rinse q12n4p  . aspirin  325 mg Per Tube Daily  . cefTRIAXone (ROCEPHIN)  IV  2 g Intravenous Q12H  . chlorhexidine  15 mL Mouth Rinse BID  . enoxaparin (LOVENOX) injection  40 mg Subcutaneous Q24H  . famotidine  20 mg Per Tube Daily  . feeding supplement (PRO-STAT SUGAR FREE 64)  30 mL Per Tube Daily  . folic acid  1 mg Per Tube Daily  . free water  200 mL Per Tube Q4H  . levETIRAcetam  500 mg Per Tube BID  . methylPREDNISolone (SOLU-MEDROL) injection  125 mg Intravenous Q8H  . sodium chloride flush  10-40 mL Intracatheter Q12H  .  thiamine  100 mg Per Tube Daily  . valproic acid  250 mg Per Tube Q8H  . vancomycin  1,000 mg Intravenous Q24H    Time spent on care of this patient: 25 mins   Aundria Bitterman T , MD   [facility] Office  [phone] Pager - Text Page per Loretha Stapler as per below:  On-Call/Text Page:      [email or web address]      password [email or web address]  If 7PM-7AM, please contact night-coverage [email or web address] Password [email or web address] 03/27/2016, 9:30 AM   LOS: 7 days

## 2016-03-28 DIAGNOSIS — G060 Intracranial abscess and granuloma: Secondary | ICD-10-CM

## 2016-03-28 DIAGNOSIS — B9689 Other specified bacterial agents as the cause of diseases classified elsewhere: Secondary | ICD-10-CM

## 2016-03-28 LAB — GLUCOSE, CAPILLARY
GLUCOSE-CAPILLARY: 108 mg/dL — AB (ref 65–99)
GLUCOSE-CAPILLARY: 118 mg/dL — AB (ref 65–99)
GLUCOSE-CAPILLARY: 107 mg/dL — AB (ref 65–99)
Glucose-Capillary: 110 mg/dL — ABNORMAL HIGH (ref 65–99)
Glucose-Capillary: 114 mg/dL — ABNORMAL HIGH (ref 65–99)
Glucose-Capillary: 125 mg/dL — ABNORMAL HIGH (ref 65–99)

## 2016-03-28 LAB — CBC
HEMATOCRIT: 31.2 % — AB (ref 39.0–52.0)
Hemoglobin: 10.2 g/dL — ABNORMAL LOW (ref 13.0–17.0)
MCH: 28.7 pg (ref 26.0–34.0)
MCHC: 32.7 g/dL (ref 30.0–36.0)
MCV: 87.9 fL (ref 78.0–100.0)
Platelets: 200 10*3/uL (ref 150–400)
RBC: 3.55 MIL/uL — ABNORMAL LOW (ref 4.22–5.81)
RDW: 14.1 % (ref 11.5–15.5)
WBC: 17.5 10*3/uL — ABNORMAL HIGH (ref 4.0–10.5)

## 2016-03-28 LAB — COMPREHENSIVE METABOLIC PANEL
ALBUMIN: 2.1 g/dL — AB (ref 3.5–5.0)
ALK PHOS: 65 U/L (ref 38–126)
ALT: 29 U/L (ref 17–63)
AST: 22 U/L (ref 15–41)
Anion gap: 11 (ref 5–15)
BILIRUBIN TOTAL: 0.3 mg/dL (ref 0.3–1.2)
BUN: 26 mg/dL — AB (ref 6–20)
CALCIUM: 8.1 mg/dL — AB (ref 8.9–10.3)
CO2: 28 mmol/L (ref 22–32)
CREATININE: 1.21 mg/dL (ref 0.61–1.24)
Chloride: 105 mmol/L (ref 101–111)
GFR calc Af Amer: >60 mL/min (ref >60)
GFR calc non Af Amer: >60 mL/min (ref >60)
GLUCOSE: 115 mg/dL — AB (ref 65–99)
POTASSIUM: 3.7 mmol/L (ref 3.5–5.1)
Sodium: 144 mmol/L (ref 135–145)
TOTAL PROTEIN: 5.2 g/dL — AB (ref 6.5–8.1)

## 2016-03-28 LAB — HEMOGLOBIN A1C
Hgb A1c MFr Bld: 5.8 % — ABNORMAL HIGH (ref 4.8–5.6)
MEAN PLASMA GLUCOSE: 120 mg/dL

## 2016-03-28 MED ORDER — VANCOMYCIN HCL IN DEXTROSE 1-5 GM/200ML-% IV SOLN
1000.0000 mg | Freq: Once | INTRAVENOUS | Status: AC
  Administered 2016-03-28: 1000 mg via INTRAVENOUS
  Filled 2016-03-28: qty 200

## 2016-03-28 MED ORDER — VANCOMYCIN HCL 10 G IV SOLR: 1500.0000 mg | INTRAVENOUS | Status: DC

## 2016-03-28 NOTE — Care Management Note (Signed)
Case Management Note  Patient Details  Name: Dale [name] MRN: [id number] Date of Birth: 11/06/1966  Subjective/Objective:                    Action/Plan: Pt continues with IV abx. Scheduled for TEE tomorrow. CM continuing to follow for d/c needs.   Expected Discharge Date:                  Expected Discharge Plan:  Skilled Nursing Facility  In-House Referral:  Clinical Social Work  Discharge planning Services  CM Consult  Post Acute Care Choice:    Choice offered to:     DME Arranged:    DME Agency:     HH Arranged:    HH Agency:     Status of Service:  In process, will continue to follow  Medicare Important Message Given:    Date Medicare IM Given:    Medicare IM give by:    Date Additional Medicare IM Given:    Additional Medicare Important Message give by:     If discussed at Long Length of Stay Meetings, dates discussed:    Additional Comments:  [name], RN 03/28/2016, 2:40 PM

## 2016-03-28 NOTE — Progress Notes (Signed)
Nutrition Follow-up  DOCUMENTATION CODES:   Non-severe (moderate) malnutrition in context of chronic illness  INTERVENTION:  Continue TF via Cortrak tube with Jevity 1.2 at goal rate of 65 ml/h with Prostat 30 ml once daily to provide 1972 kcals, 102 gm protein, 1264 ml free water daily. TF plus Free water flushes (200 ml q 4 hours) provides a total of 2464 ml of fluid daily.    NUTRITION DIAGNOSIS:   Malnutrition related to chronic illness as evidenced by mild depletion of body fat, moderate depletion of body fat, mild depletion of muscle mass, moderate depletions of muscle mass.  Ongoing  GOAL:   Patient will meet greater than or equal to 90% of their needs  Being met  MONITOR:   TF tolerance, Labs, Weight trends, I & O's, Skin  REASON FOR ASSESSMENT:   Consult Enteral/tube feeding initiation and management  ASSESSMENT:   50 yo M with hx homelessness, admitted 3-14 to 03-10-16 with streptococcal (CSF Pneumococcus R-PEN) meningitis. His course was complicated by v-fib and respiratory arrest, as well as ARF due to ATN. D/C'ed to SNF. on 4-16 after being found down. He was hypothermic. His CT head suggests subacute infarcts of basal ganglia. MRI also showed these and suggested them as residual from his previous meningitis.  Pt is receiving Jevity 1.2 via cortrak tube at goal rate of 65 ml/hr. Tolerating TF well per RN. Pt awake, but non-responsive at time of visit. His weight is up 13 lbs from 1 week ago. Palliative care has been consulted to address goals of care per chart.   Labs: glucose ranging 99 to 126 mg/dL, low calcium, low hemoglobin  Diet Order:  Diet NPO time specified  Skin:  Reviewed, no issues  Last BM:  4/23  Height:   Ht Readings from Last 1 Encounters:  03/20/16 '5\' 8"'$  (1.727 m)    Weight:   Wt Readings from Last 1 Encounters:  03/27/16 143 lb 4.8 oz (65 kg)    Ideal Body Weight:  72.7 kg  BMI:  Body mass index is 21.79 kg/(m^2).  Estimated  Nutritional Needs:   Kcal:  1850-2100  Protein:  100-115 gm  Fluid:  >/= 2 L  EDUCATION NEEDS:   No education needs identified at this time  Scarlette Ar RD, LDN Inpatient Clinical Dietitian Pager: [phone] After Hours Pager: [phone]

## 2016-03-28 NOTE — Progress Notes (Addendum)
INFECTIOUS DISEASE PROGRESS NOTE  ID: Dale [name] is a 50 y.o. male with  Active Problems:   Hypothermia   Severe sepsis (HCC)   Meningitis, pneumococcal, recurrent   Systolic and diastolic CHF, chronic (HCC)   Acute renal failure (HCC)   Hypernatremia   Malnutrition of moderate degree   Cerebral thrombosis with cerebral infarction   Meningoencephalitis   Encephalopathy acute   Bacterial meningitis   Acute ischemic stroke (HCC)   Sepsis (HCC)   Cerebral septic emboli (HCC)  Subjective: More awake.   Abtx:  Anti-infectives    Start     Dose/Rate Route Frequency Ordered Stop   03/29/16 0900  vancomycin (VANCOCIN) 1,500 mg in sodium chloride 0.9 % 500 mL IVPB     1,500 mg 250 mL/hr over 120 Minutes Intravenous Every 24 hours 03/28/16 0745     03/28/16 0800  vancomycin (VANCOCIN) IVPB 1000 mg/200 mL premix     1,000 mg 200 mL/hr over 60 Minutes Intravenous  Once 03/28/16 0745 03/28/16 0920   03/28/16 0500  vancomycin (VANCOCIN) 500 mg in sodium chloride 0.9 % 100 mL IVPB  Status:  Discontinued     500 mg 100 mL/hr over 60 Minutes Intravenous Every 12 hours 03/27/16 1642 03/28/16 0745   03/24/16 1600  vancomycin (VANCOCIN) IVPB 1000 mg/200 mL premix  Status:  Discontinued     1,000 mg 200 mL/hr over 60 Minutes Intravenous Every 24 hours 03/24/16 1124 03/27/16 1642   03/21/16 1000  vancomycin (VANCOCIN) 500 mg in sodium chloride 0.9 % 100 mL IVPB  Status:  Discontinued     500 mg 100 mL/hr over 60 Minutes Intravenous Every 12 hours 03/20/16 1940 03/24/16 1113   03/21/16 0800  cefTRIAXone (ROCEPHIN) 2 g in dextrose 5 % 50 mL IVPB     2 g 100 mL/hr over 30 Minutes Intravenous Every 12 hours 03/21/16 0040     03/20/16 2000  vancomycin (VANCOCIN) IVPB 1000 mg/200 mL premix     1,000 mg 200 mL/hr over 60 Minutes Intravenous NOW 03/20/16 1940 03/20/16 2103   03/20/16 1930  cefTRIAXone (ROCEPHIN) 2 g in dextrose 5 % 50 mL IVPB     2 g 100 mL/hr over 30 Minutes Intravenous   Once 03/20/16 1919 03/20/16 2030      Medications:  Scheduled: . antiseptic oral rinse  7 mL Mouth Rinse q12n4p  . aspirin  325 mg Per Tube Daily  . atorvastatin  20 mg Oral q1800  . carvedilol  3.125 mg Per Tube BID WC  . cefTRIAXone (ROCEPHIN)  IV  2 g Intravenous Q12H  . chlorhexidine  15 mL Mouth Rinse BID  . enoxaparin (LOVENOX) injection  40 mg Subcutaneous Q24H  . famotidine  20 mg Per Tube Daily  . feeding supplement (PRO-STAT SUGAR FREE 64)  30 mL Per Tube Daily  . folic acid  1 mg Per Tube Daily  . free water  200 mL Per Tube Q4H  . levETIRAcetam  500 mg Per Tube BID  . methylPREDNISolone (SOLU-MEDROL) injection  125 mg Intravenous Q8H  . sodium chloride flush  10-40 mL Intracatheter Q12H  . thiamine  100 mg Per Tube Daily  . valproic acid  250 mg Per Tube Q8H  . [START ON 03/29/2016] vancomycin  1,500 mg Intravenous Q24H    Objective: Vital signs in last 24 hours: Temp:  [97.4 F (36.3 C)-99 F (37.2 C)] 97.7 F (36.5 C) (04/24 0956) Pulse Rate:  [52-68] 64 (04/24 0956)  Resp:  [12-18] 16 (04/24 0956) BP: (115-142)/(68-86) 136/76 mmHg (04/24 0956) SpO2:  [95 %-99 %] 99 % (04/24 0956)   General appearance: alert and no distress Resp: clear to auscultation bilaterally Cardio: regular rate and rhythm GI: normal findings: bowel sounds normal and soft, non-tender Neurologic: Motor: he blinks, wiggles toes on command.   Lab Results  Recent Labs  03/27/16 0534 03/28/16 0400  WBC 15.9* 17.5*  HGB 10.3* 10.2*  HCT 31.0* 31.2*  NA 147* 144  K 3.7 3.7  CL 108 105  CO2 26 28  BUN 27* 26*  CREATININE 1.27* 1.21   Liver Panel  Recent Labs  03/26/16 0315 03/28/16 0400  PROT 5.3* 5.2*  ALBUMIN 1.9* 2.1*  AST 19 22  ALT 30 29  ALKPHOS 58 65  BILITOT 0.3 0.3   Sedimentation Rate No results for input(s): ESRSEDRATE in the last 72 hours. C-Reactive Protein No results for input(s): CRP in the last 72 hours.  Microbiology: Recent Results (from the past  240 hour(s))  Blood culture (routine x 2)     Status: None   Collection Time: 03/20/16  6:00 PM  Result Value Ref Range Status   Specimen Description BLOOD RIGHT ARM  Final   Special Requests BOTTLES DRAWN AEROBIC AND ANAEROBIC 5CC  Final   Culture NO GROWTH 5 DAYS  Final   Report Status 03/25/2016 FINAL  Final  Blood culture (routine x 2)     Status: None   Collection Time: 03/20/16  6:10 PM  Result Value Ref Range Status   Specimen Description BLOOD RIGHT HAND  Final   Special Requests BOTTLES DRAWN AEROBIC AND ANAEROBIC 5CC  Final   Culture NO GROWTH 5 DAYS  Final   Report Status 03/25/2016 FINAL  Final  CSF culture     Status: None   Collection Time: 03/20/16 10:12 PM  Result Value Ref Range Status   Specimen Description CSF  Final   Special Requests TUBE 2  2CC  Final   Gram Stain   Final    CYTOSPIN SLIDE WBC PRESENT,BOTH PMN AND MONONUCLEAR NO ORGANISMS SEEN    Culture NO GROWTH 3 DAYS  Final   Report Status 03/24/2016 FINAL  Final  MRSA PCR Screening     Status: Abnormal   Collection Time: 03/20/16 11:00 PM  Result Value Ref Range Status   MRSA by PCR POSITIVE (A) NEGATIVE Final    Comment:        The GeneXpert MRSA Assay (FDA approved for NASAL specimens only), is one component of a comprehensive MRSA colonization surveillance program. It is not intended to diagnose MRSA infection nor to guide or monitor treatment for MRSA infections. RESULT CALLED TO, READ BACK BY AND VERIFIED WITH: DAVIS,E RN 0214 03/21/16 MITCHELL,L     Studies/Results: No results found.   Assessment/Plan: Recurrent meningitis/brain abscess  Total days of antibiotics: 8 vanco/ceftriaxone Better but still a long way to improve.  Will stop vanco Defer to neuro, stopping steroids.  Would plan for 4 weeks atbx Cr improved Palliative care eval.  Available as needed         Johny SaxJeffrey Hatcher Infectious Diseases (pager) [phone] [email or web address] 03/28/2016, 11:31 AM  LOS: 8  days

## 2016-03-28 NOTE — Progress Notes (Signed)
PROGRESS NOTE  Dale [name]:[id number] DOB: 1966-11-28 DOA: 03/20/2016 PCP: No primary care provider on file.  Admit HPI / Brief Narrative: 50 yo M with hx homelessness, admitted 3-14 to 03-10-16 with streptococcal (CSF Pneumococcus R-PEN) meningitis. His course was complicated by v-fib and respiratory arrest, as well as ARF due to ATN. He received 14 days of ceftriaxone, and weekly IM Pen G (for positive RPR 1:2). He was d/c to SNF. Non-verbal. At SNF his mental status was stable.   He returned to the ED on 4-16 after being found down. He was hypothermic. He was started on vanco/ceftriaxone. He had repeat LP showing 400 WBC (75%N), Prot > 600, Glc 37. He had solumedrol added. His CT head suggested subacute infarcts of basal ganglia. MRI also showed these and suggested them as residual from his previous meningitis. (per Dr. Ninetta Lights)  HPI/Subjective:  open his eyes , not following command,   Pt remains non communicative.  There is no family present in his room.    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 - Neuro suggests steroid + abx   Chronic Systolic and Diastolic CHF  -EF 45-50% on TTE 4/18 (improved since March) - no evidence of signif volume overload at this time - wgt at d/c 4/11 ~56kg - no signif volume overload at this time - +~7L since admit  Filed Weights     03/25/16 0436 03/26/16 0258 03/27/16 0418  Weight: 60.3 kg (132 lb 15 oz) 62 kg (136 lb 11 oz) 65 kg (143 lb 4.8 oz)   Acute renal failure -multifactorial to include sepsis, heart failure, dehydration - crt peaked at 1.83 - continues to improve slowly   Recent Labs Lab 03/24/16 0458 03/25/16 0428 03/26/16 0315 03/27/16 0534 03/28/16 0400  CREATININE 1.66* 1.51* 1.41* 1.27* 1.21    Hypernatremia -due to true dehydration - increased free water via NG - follow   Recent Labs Lab 03/24/16 0458 03/25/16 0428 03/26/16 0315 03/27/16 0534 03/28/16 0400  NA 145 146* 148* 147* 144    Hypothermia -resolved   Non-severe (moderate) malnutrition in context of chronic illness -CoreTrack placed for feeding - cont tube feeds and free water  MRSA Screen +  Code Status: FULL Family Communication: no family contact today  Disposition Plan:  Palliative Care consultation  to address goals of care   Consultants: Neurology  ID Palliative care  Procedures: 4/18 TTE - EF 45-50% - diffuse hypokinesis - grade 1 diastolic dysfunction  Antibiotics: Vancomycin 4/16 > Rocephin 4/16 >  DVT prophylaxis: lovenox   Objective: Blood pressure 129/68, pulse 56, temperature 97.9 F (36.6 C), temperature source Oral, resp. rate 16, height  (1.727 m), weight 65 kg (143 lb 4.8 oz), SpO2 97 %.  Intake/Output Summary (Last 24 hours) at 03/28/16 0807 Last data filed at 03/27/16 1220  Gross per 24 hour  Intake 441.33 ml  Output    375 ml  Net  66.33 ml   Exam: General: No acute respiratory distress - somnolent - non-communicative  Lungs: Clear to auscultation bilaterally - no wheeze  Cardiovascular: Regular rate and rhythm - no M or rub  Abdomen: Nondistended, soft, bowel sounds positive, no ascites, no mass Extremities: No significant cyanosis, clubbing, or edema bilateral lower extremities  Data Reviewed:  Basic Metabolic Panel:  Recent Labs Lab 03/23/16 0322 03/24/16 0458  03/25/16 0428 03/26/16 0315 03/27/16 0534 03/28/16 0400  NA 148* 145 146* 148* 147* 144  K 4.3 3.7 4.0 3.6 3.7 3.7  CL 117* 112* 114* 113* 108 105  CO2 18* 17* 19* 23 26 28   GLUCOSE 124* 121* 84 104* 120* 115*  BUN 20 22* 27* 27* 27* 26*  CREATININE 1.83* 1.66* 1.51* 1.41* 1.27* 1.21  CALCIUM 8.8* 8.5* 8.6* 8.4* 8.3* 8.1*  MG 1.8  --   --   --   --   --   PHOS  --   --  4.0  --   --   --     CBC:  Recent Labs Lab 03/23/16 0322 03/24/16 0458 03/25/16 0428 03/26/16 0315 03/27/16 0534 03/28/16 0400  WBC 8.8 14.8* 12.9* 10.8* 15.9* 17.5*  NEUTROABS 7.3 13.1*  --   --   --   --   HGB 10.8* 10.2* 10.5* 10.0* 10.3* 10.2*  HCT 34.0* 31.7* 30.9* 30.6* 31.0* 31.2*  MCV 89.7 89.5 87.5 89.2 88.8 87.9  PLT 275 263 224 240 234 200    Liver Function Tests:  Recent Labs Lab 03/23/16 0322 03/24/16 0458 03/26/16 0315 03/28/16 0400  AST 19 15 19 22   ALT 32 30 30 29   ALKPHOS 62 61 58 65  BILITOT 0.3 0.2* 0.3 0.3  PROT 6.2* 5.7* 5.3* 5.2*  ALBUMIN 2.1* 2.1* 1.9* 2.1*    Recent Labs Lab 03/24/16 0458 03/27/16 0536  AMMONIA 43* 24   Cardiac Enzymes:  Recent Labs Lab 03/21/16 1240  TROPONINI <0.03    CBG:  Recent Labs Lab 03/27/16 1138 03/27/16 1633 03/27/16 2017 03/28/16 0015 03/28/16 0405  GLUCAP 107* 118* 110* 125* 108*    Recent Results (from the past 240 hour(s))  Blood culture (routine x 2)     Status: None   Collection Time: 03/20/16  6:00 PM  Result Value Ref Range Status   Specimen Description BLOOD RIGHT ARM  Final   Special Requests BOTTLES DRAWN AEROBIC AND ANAEROBIC 5CC  Final   Culture NO GROWTH 5 DAYS  Final   Report Status 03/25/2016 FINAL  Final  Blood culture (routine x 2)     Status: None   Collection Time: 03/20/16  6:10 PM  Result Value Ref Range Status   Specimen Description BLOOD RIGHT HAND  Final   Special Requests BOTTLES DRAWN AEROBIC AND ANAEROBIC 5CC  Final   Culture NO GROWTH 5 DAYS  Final   Report Status 03/25/2016 FINAL   Final  CSF culture     Status: None   Collection Time: 03/20/16 10:12 PM  Result Value Ref Range Status   Specimen Description CSF  Final   Special Requests TUBE 2  2CC  Final   Gram Stain   Final    CYTOSPIN SLIDE WBC PRESENT,BOTH PMN AND MONONUCLEAR NO ORGANISMS SEEN    Culture NO GROWTH 3 DAYS  Final   Report Status 03/24/2016 FINAL  Final  MRSA PCR Screening     Status: Abnormal   Collection Time: 03/20/16 11:00 PM  Result Value Ref  Range Status   MRSA by PCR POSITIVE (A) NEGATIVE Final    Comment:        The GeneXpert MRSA Assay (FDA approved for NASAL specimens only), is one component of a comprehensive MRSA colonization surveillance program. It is not intended to diagnose MRSA infection nor to guide or monitor treatment for MRSA infections. RESULT CALLED TO, READ BACK BY AND VERIFIED WITH: DAVIS,E RN 0214 03/21/16 MITCHELL,L      Studies:   Recent x-ray studies have been reviewed in detail by the Attending Physician  Scheduled Meds:  Scheduled Meds: . antiseptic oral rinse  7 mL Mouth Rinse q12n4p  . aspirin  325 mg Per Tube Daily  . atorvastatin  20 mg Oral q1800  . carvedilol  3.125 mg Per Tube BID WC  . cefTRIAXone (ROCEPHIN)  IV  2 g Intravenous Q12H  . chlorhexidine  15 mL Mouth Rinse BID  . enoxaparin (LOVENOX) injection  40 mg Subcutaneous Q24H  . famotidine  20 mg Per Tube Daily  . feeding supplement (PRO-STAT SUGAR FREE 64)  30 mL Per Tube Daily  . folic acid  1 mg Per Tube Daily  . free water  200 mL Per Tube Q4H  . levETIRAcetam  500 mg Per Tube BID  . methylPREDNISolone (SOLU-MEDROL) injection  125 mg Intravenous Q8H  . sodium chloride flush  10-40 mL Intracatheter Q12H  . thiamine  100 mg Per Tube Daily  . valproic acid  250 mg Per Tube Q8H  . [START ON 03/29/2016] vancomycin  1,500 mg Intravenous Q24H  . vancomycin  1,000 mg Intravenous Once    Time spent on care of this patient: 25 mins   Yarithza Mink , MD PhD [phone] [facility]  [phone] Pager - Text Page per Amion as per below:  On-Call/Text Page:      [email or web address]      password [email or web address]  If 7PM-7AM, please contact night-coverage [email or web address] Password [email or web address] 03/28/2016, 8:07 AM   LOS: 8 days

## 2016-03-28 NOTE — Progress Notes (Signed)
    [facility] has been requested to perform a transesophageal echocardiogram on Dale [name]  for bacteremia.  After careful review of history and examination, the risks and benefits of transesophageal echocardiogram have been explained including risks of esophageal damage, perforation (1:10,000 risk), bleeding, pharyngeal hematoma as well as other potential complications associated with conscious sedation including aspiration, arrhythmia, respiratory failure and death. Alternatives to treatment were discussed, questions were answered. The patient is nonverbal at this time and consent was obtained by the patient's wife. Witnessed by patient's RN. She agrees for him to proceed tomorrow.  Hgb at 10.2. Platelets 200. BP has been 115/68 - 142/84 in the past 24 hours. No requirement for pressors.   NPO after midnight. Scheduled for 0900 on 03/29/2016.   Lennart PallBrittany M Careem Yasui,PA-C  03/28/2016 3:24 PM

## 2016-03-29 ENCOUNTER — Encounter (HOSPITAL_COMMUNITY)
Admission: EM | Disposition: A | Discharge status: Skilled Nursing Facility | Payer: Self-pay | Source: Home / Self Care | Attending: Internal Medicine

## 2016-03-29 ENCOUNTER — Inpatient Hospital Stay (HOSPITAL_COMMUNITY): Payer: Medicaid Other

## 2016-03-29 ENCOUNTER — Encounter (HOSPITAL_COMMUNITY): Payer: Self-pay | Admitting: *Deleted

## 2016-03-29 DIAGNOSIS — R7881 Bacteremia: Secondary | ICD-10-CM

## 2016-03-29 HISTORY — PX: TEE WITHOUT CARDIOVERSION: SHX5443

## 2016-03-29 LAB — CBC
HCT: 31.2 % — ABNORMAL LOW (ref 39.0–52.0)
HEMOGLOBIN: 10.4 g/dL — AB (ref 13.0–17.0)
MCH: 28.7 pg (ref 26.0–34.0)
MCHC: 33.3 g/dL (ref 30.0–36.0)
MCV: 86 fL (ref 78.0–100.0)
Platelets: 181 10*3/uL (ref 150–400)
RBC: 3.63 MIL/uL — ABNORMAL LOW (ref 4.22–5.81)
RDW: 13.8 % (ref 11.5–15.5)
WBC: 15 10*3/uL — AB (ref 4.0–10.5)

## 2016-03-29 LAB — MAGNESIUM: Magnesium: 2.1 mg/dL (ref 1.7–2.4)

## 2016-03-29 LAB — GLUCOSE, CAPILLARY
GLUCOSE-CAPILLARY: 101 mg/dL — AB (ref 65–99)
GLUCOSE-CAPILLARY: 97 mg/dL (ref 65–99)
Glucose-Capillary: 102 mg/dL — ABNORMAL HIGH (ref 65–99)
Glucose-Capillary: 92 mg/dL (ref 65–99)
Glucose-Capillary: 94 mg/dL (ref 65–99)
Glucose-Capillary: 87 mg/dL (ref 65–99)

## 2016-03-29 LAB — BASIC METABOLIC PANEL
Anion gap: 10 (ref 5–15)
BUN: 24 mg/dL — AB (ref 6–20)
CHLORIDE: 101 mmol/L (ref 101–111)
CO2: 31 mmol/L (ref 22–32)
Calcium: 8.1 mg/dL — ABNORMAL LOW (ref 8.9–10.3)
Creatinine, Ser: 1.21 mg/dL (ref 0.61–1.24)
GFR calc Af Amer: >60 mL/min (ref >60)
GFR calc non Af Amer: >60 mL/min (ref >60)
GLUCOSE: 106 mg/dL — AB (ref 65–99)
POTASSIUM: 4.6 mmol/L (ref 3.5–5.1)
Sodium: 142 mmol/L (ref 135–145)

## 2016-03-29 SURGERY — ECHOCARDIOGRAM, TRANSESOPHAGEAL
Anesthesia: Moderate Sedation

## 2016-03-29 MED ORDER — SODIUM CHLORIDE 0.9 % IV SOLN
INTRAVENOUS | Status: DC
  Administered 2016-03-29: 500 mL via INTRAVENOUS

## 2016-03-29 MED ORDER — FENTANYL CITRATE (PF) 100 MCG/2ML IJ SOLN
INTRAMUSCULAR | Status: AC
  Filled 2016-03-29: qty 2

## 2016-03-29 MED ORDER — MIDAZOLAM HCL 10 MG/2ML IJ SOLN
INTRAMUSCULAR | Status: DC | PRN
Start: 2016-03-29 — End: 2016-03-29
  Administered 2016-03-29: 2 mg via INTRAVENOUS

## 2016-03-29 MED ORDER — FENTANYL CITRATE (PF) 100 MCG/2ML IJ SOLN
INTRAMUSCULAR | Status: DC | PRN
  Administered 2016-03-29: 25 ug via INTRAVENOUS

## 2016-03-29 MED ORDER — DIPHENHYDRAMINE HCL 50 MG/ML IJ SOLN
INTRAMUSCULAR | Status: AC
  Filled 2016-03-29: qty 1

## 2016-03-29 MED ORDER — MIDAZOLAM HCL 5 MG/ML IJ SOLN
INTRAMUSCULAR | Status: AC
  Filled 2016-03-29: qty 2

## 2016-03-29 NOTE — H&P (View-Only) (Signed)
 PROGRESS NOTE  Dale [name] MRN:[id number] DOB: 12/25/1965 DOA: 03/20/2016 PCP: No primary care provider on file.  Admit HPI / Brief Narrative: 49 yo M with hx homelessness, admitted 3-14 to 03-10-16 with streptococcal (CSF Pneumococcus R-PEN) meningitis. His course was complicated by v-fib and respiratory arrest, as well as ARF due to ATN. He received 14 days of ceftriaxone, and weekly IM Pen G (for positive RPR 1:2). He was d/c to SNF. Non-verbal. At SNF his mental status was stable.   He returned to the ED on 4-16 after being found down. He was hypothermic. He was started on vanco/ceftriaxone. He had repeat LP showing 400 WBC (75%N), Prot > 600, Glc 37. He had solumedrol added. His CT head suggested subacute infarcts of basal ganglia. MRI also showed these and suggested them as residual from his previous meningitis. (per Dr. Hatcher)  HPI/Subjective:  open his eyes , not following command,   Pt remains non communicative.  There is no family present in his room.    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 - Neuro suggests steroid + abx   Chronic Systolic and Diastolic CHF  -EF 45-50% on TTE 4/18 (improved since March) - no evidence of signif volume overload at this time - wgt at d/c 4/11 ~56kg - no signif volume overload at this time - +~7L since admit  Filed Weights     03/25/16 0436 03/26/16 0258 03/27/16 0418  Weight: 60.3 kg (132 lb 15 oz) 62 kg (136 lb 11 oz) 65 kg (143 lb 4.8 oz)   Acute renal failure -multifactorial to include sepsis, heart failure, dehydration - crt peaked at 1.83 - continues to improve slowly   Recent Labs Lab 03/24/16 0458 03/25/16 0428 03/26/16 0315 03/27/16 0534 03/28/16 0400  CREATININE 1.66* 1.51* 1.41* 1.27* 1.21    Hypernatremia -due to true dehydration - increased free water via NG - follow   Recent Labs Lab 03/24/16 0458 03/25/16 0428 03/26/16 0315 03/27/16 0534 03/28/16 0400  NA 145 146* 148* 147* 144    Hypothermia -resolved   Non-severe (moderate) malnutrition in context of chronic illness -CoreTrack placed for feeding - cont tube feeds and free water  MRSA Screen +  Code Status: FULL Family Communication: no family contact today  Disposition Plan:  Palliative Care consultation  to address goals of care   Consultants: Neurology  ID Palliative care  Procedures: 4/18 TTE - EF 45-50% - diffuse hypokinesis - grade 1 diastolic dysfunction  Antibiotics: Vancomycin 4/16 > Rocephin 4/16 >  DVT prophylaxis: lovenox   Objective: Blood pressure 129/68, pulse 56, temperature 97.9 F (36.6 C), temperature source Oral, resp. rate 16, height 5' 8" (1.727 m), weight 65 kg (143 lb 4.8 oz), SpO2 97 %.  Intake/Output Summary (Last 24 hours) at 03/28/16 0807 Last data filed at 03/27/16 1220  Gross per 24 hour  Intake 441.33 ml  Output    375 ml    Net  66.33 ml   Exam: General: No acute respiratory distress - somnolent - non-communicative  Lungs: Clear to auscultation bilaterally - no wheeze  Cardiovascular: Regular rate and rhythm - no M or rub  Abdomen: Nondistended, soft, bowel sounds positive, no ascites, no mass Extremities: No significant cyanosis, clubbing, or edema bilateral lower extremities  Data Reviewed:  Basic Metabolic Panel:  Recent Labs Lab 03/23/16 0322 03/24/16 0458  03/25/16 0428 03/26/16 0315 03/27/16 0534 03/28/16 0400  NA 148* 145 146* 148* 147* 144  K 4.3 3.7 4.0 3.6 3.7 3.7  CL 117* 112* 114* 113* 108 105  CO2 18* 17* 19* 23 26 28  GLUCOSE 124* 121* 84 104* 120* 115*  BUN 20 22* 27* 27* 27* 26*  CREATININE 1.83* 1.66* 1.51* 1.41* 1.27* 1.21  CALCIUM 8.8* 8.5* 8.6* 8.4* 8.3* 8.1*  MG 1.8  --   --   --   --   --   PHOS  --   --  4.0  --   --   --     CBC:  Recent Labs Lab 03/23/16 0322 03/24/16 0458 03/25/16 0428 03/26/16 0315 03/27/16 0534 03/28/16 0400  WBC 8.8 14.8* 12.9* 10.8* 15.9* 17.5*  NEUTROABS 7.3 13.1*  --   --   --   --   HGB 10.8* 10.2* 10.5* 10.0* 10.3* 10.2*  HCT 34.0* 31.7* 30.9* 30.6* 31.0* 31.2*  MCV 89.7 89.5 87.5 89.2 88.8 87.9  PLT 275 263 224 240 234 200    Liver Function Tests:  Recent Labs Lab 03/23/16 0322 03/24/16 0458 03/26/16 0315 03/28/16 0400  AST 19 15 19 22  ALT 32 30 30 29  ALKPHOS 62 61 58 65  BILITOT 0.3 0.2* 0.3 0.3  PROT 6.2* 5.7* 5.3* 5.2*  ALBUMIN 2.1* 2.1* 1.9* 2.1*    Recent Labs Lab 03/24/16 0458 03/27/16 0536  AMMONIA 43* 24   Cardiac Enzymes:  Recent Labs Lab 03/21/16 1240  TROPONINI <0.03    CBG:  Recent Labs Lab 03/27/16 1138 03/27/16 1633 03/27/16 2017 03/28/16 0015 03/28/16 0405  GLUCAP 107* 118* 110* 125* 108*    Recent Results (from the past 240 hour(s))  Blood culture (routine x 2)     Status: None   Collection Time: 03/20/16  6:00 PM  Result Value Ref Range Status   Specimen Description BLOOD RIGHT ARM  Final   Special Requests BOTTLES DRAWN AEROBIC AND ANAEROBIC 5CC  Final   Culture NO GROWTH 5 DAYS  Final   Report Status 03/25/2016 FINAL  Final  Blood culture (routine x 2)     Status: None   Collection Time: 03/20/16  6:10 PM  Result Value Ref Range Status   Specimen Description BLOOD RIGHT HAND  Final   Special Requests BOTTLES DRAWN AEROBIC AND ANAEROBIC 5CC  Final   Culture NO GROWTH 5 DAYS  Final   Report Status 03/25/2016 FINAL   Final  CSF culture     Status: None   Collection Time: 03/20/16 10:12 PM  Result Value Ref Range Status   Specimen Description CSF  Final   Special Requests TUBE 2  2CC  Final   Gram Stain   Final    CYTOSPIN SLIDE WBC PRESENT,BOTH PMN AND MONONUCLEAR NO ORGANISMS SEEN    Culture NO GROWTH 3 DAYS  Final   Report Status 03/24/2016 FINAL  Final  MRSA PCR Screening     Status: Abnormal   Collection Time: 03/20/16 11:00 PM  Result Value Ref   Range Status   MRSA by PCR POSITIVE (A) NEGATIVE Final    Comment:        The GeneXpert MRSA Assay (FDA approved for NASAL specimens only), is one component of a comprehensive MRSA colonization surveillance program. It is not intended to diagnose MRSA infection nor to guide or monitor treatment for MRSA infections. RESULT CALLED TO, READ BACK BY AND VERIFIED WITH: DAVIS,E RN 0214 03/21/16 MITCHELL,L      Studies:   Recent x-ray studies have been reviewed in detail by the Attending Physician  Scheduled Meds:  Scheduled Meds: . antiseptic oral rinse  7 mL Mouth Rinse q12n4p  . aspirin  325 mg Per Tube Daily  . atorvastatin  20 mg Oral q1800  . carvedilol  3.125 mg Per Tube BID WC  . cefTRIAXone (ROCEPHIN)  IV  2 g Intravenous Q12H  . chlorhexidine  15 mL Mouth Rinse BID  . enoxaparin (LOVENOX) injection  40 mg Subcutaneous Q24H  . famotidine  20 mg Per Tube Daily  . feeding supplement (PRO-STAT SUGAR FREE 64)  30 mL Per Tube Daily  . folic acid  1 mg Per Tube Daily  . free water  200 mL Per Tube Q4H  . levETIRAcetam  500 mg Per Tube BID  . methylPREDNISolone (SOLU-MEDROL) injection  125 mg Intravenous Q8H  . sodium chloride flush  10-40 mL Intracatheter Q12H  . thiamine  100 mg Per Tube Daily  . valproic acid  250 mg Per Tube Q8H  . [START ON 03/29/2016] vancomycin  1,500 mg Intravenous Q24H  . vancomycin  1,000 mg Intravenous Once    Time spent on care of this patient: 25 mins   Uzziel Russey , MD PhD [phone] [facility]  [phone] Pager - Text Page per Amion as per below:  On-Call/Text Page:      [email or web address]      password [email or web address]  If 7PM-7AM, please contact night-coverage [email or web address] Password [email or web address] 03/28/2016, 8:07 AM   LOS: 8 days       

## 2016-03-29 NOTE — Progress Notes (Signed)
Echocardiogram Echocardiogram Transesophageal has been performed.  Dale [name], Dale [name] 03/29/2016, 1:02 PM

## 2016-03-29 NOTE — Progress Notes (Addendum)
PROGRESS NOTE  Dale [name] ZOX:[id number] DOB: Dec 25, 1965 DOA: 03/20/2016 PCP: No primary care provider on file.  Admit HPI / Brief Narrative: 50 yo M with hx homelessness, admitted 3-14 to 03-10-16 with streptococcal (CSF Pneumococcus R-PEN) meningitis. His course was complicated by v-fib and respiratory arrest, as well as ARF due to ATN. He received 14 days of ceftriaxone, and weekly IM Pen G (for positive RPR 1:2). He was d/c to SNF. Non-verbal. At SNF his mental status was stable.   He returned to the ED on 4-16 after being found down. He was hypothermic. He was admitted to critical care on 4/16 and transferred to hospitalist service on 4/19 He was started on vanco/ceftriaxone. He had repeat LP showing 400 WBC (75%N), Prot > 600, Glc 37. He had solumedrol added. His CT head suggested subacute infarcts of basal ganglia. MRI also showed these and suggested them as residual from his previous meningitis. (per Dr. Ninetta Lights)  TEE no endocarditis, palliative team for goal of care, family meeting on 4/26 per palliative team.  HPI/Subjective:  returned from TEE, drowsy, but open eyes to voice briefly, not following command,   Pt remains non communicative.  There is no family present in his room.    Assessment/Plan:  Severe sepsis - Recurrent meningitis +/- SBE -pneumococcus isolated from his blood and CSF on 02/16/16 -empiric coverage with vanc + rocephin continues , will need long term abx per ID therefore PICC placed  -blood culture no growth, TEE to r/o SBE on 4/25 which did not show evidence for endocarditis.  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 - Neuro suggests steroid + abx   Chronic Systolic and Diastolic CHF  -EF 45-50% on TTE 4/18 (improved since March) - no evidence of signif volume overload at this time - wgt at d/c 4/11 ~56kg - no signif volume overload at this time -  Filed Weights   03/26/16 0258 03/27/16 0418 03/29/16 0437  Weight: 62 kg (136 lb 11 oz) 65 kg (143 lb 4.8 oz) 63 kg (138 lb 14.2 oz)   Acute renal failure -multifactorial to include sepsis, heart failure, dehydration - crt peaked at 1.83 - continues to improve slowly , cr 1.21 on 4/24 and 4/25, possible new baseline  Recent Labs Lab 03/25/16 0428 03/26/16 0315 03/27/16 0534 03/28/16 0400 03/29/16 0425  CREATININE 1.51* 1.41* 1.27* 1.21 1.21    Hypernatremia -due to true dehydration - increased free water via NG - na 142 on 4/25  Recent Labs Lab 03/25/16 0428 03/26/16 0315 03/27/16 0534 03/28/16 0400 03/29/16 0425  NA 146* 148* 147* 144 142    Hypothermia -resolved   Non-severe (moderate) malnutrition in context of chronic illness -CoreTrack placed for feeding - cont tube feeds and free water  MRSA Screen +  Code Status: FULL Family Communication: no family contact today  Disposition Plan:  Palliative Care consultation  to address goals of care , family meeting on 4/26 per palliative care.  Consultants:  Neurology  ID Palliative care  Procedures: 4/18 TTE - EF 45-50% - diffuse hypokinesis - grade 1 diastolic dysfunction  Antibiotics: Vancomycin 4/16 > Rocephin 4/16 >  DVT prophylaxis: lovenox  Objective: Blood pressure 139/80, pulse 60, temperature 97.9 F (36.6 C), temperature source Oral, resp. rate 18, height 5\' 8"  (1.727 m), weight 63 kg (138 lb 14.2 oz), SpO2 96 %.  Intake/Output Summary (Last 24 hours) at 03/29/16 1114 Last data filed at 03/29/16 1003  Gross per 24 hour  Intake    200 ml  Output   1300 ml  Net  -1100 ml   Exam: General: No acute respiratory distress -  somnolent - non-communicative  Lungs: Clear to auscultation bilaterally - no wheeze  Cardiovascular: Regular rate and rhythm - no M or rub  Abdomen: Nondistended, soft, bowel sounds positive, no ascites, no mass Extremities: No significant cyanosis, clubbing, or edema bilateral lower extremities  Data Reviewed:  Basic Metabolic Panel:  Recent Labs Lab 03/23/16 0322  03/25/16 0428 03/26/16 0315 03/27/16 0534 03/28/16 0400 03/29/16 0425  NA 148*  < > 146* 148* 147* 144 142  K 4.3  < > 4.0 3.6 3.7 3.7 4.6  CL 117*  < > 114* 113* 108 105 101  CO2 18*  < > 19* 23 26 28 31   GLUCOSE 124*  < > 84 104* 120* 115* 106*  BUN 20  < > 27* 27* 27* 26* 24*  CREATININE 1.83*  < > 1.51* 1.41* 1.27* 1.21 1.21  CALCIUM 8.8*  < > 8.6* 8.4* 8.3* 8.1* 8.1*  MG 1.8  --   --   --   --   --  2.1  PHOS  --   --  4.0  --   --   --   --   < > = values in this interval not displayed.  CBC:  Recent Labs Lab 03/23/16 0322 03/24/16 0458 03/25/16 0428 03/26/16 0315 03/27/16 0534 03/28/16 0400 03/29/16 0425  WBC 8.8 14.8* 12.9* 10.8* 15.9* 17.5* 15.0*  NEUTROABS 7.3 13.1*  --   --   --   --   --   HGB 10.8* 10.2* 10.5* 10.0* 10.3* 10.2* 10.4*  HCT 34.0* 31.7* 30.9* 30.6* 31.0* 31.2* 31.2*  MCV 89.7 89.5 87.5 89.2 88.8 87.9 86.0  PLT 275 263 224 240 234 200 181    Liver Function Tests:  Recent Labs Lab 03/23/16 0322 03/24/16 0458 03/26/16 0315 03/28/16 0400  AST 19 15 19 22   ALT 32 30 30 29   ALKPHOS 62 61 58 65  BILITOT 0.3 0.2* 0.3 0.3  PROT 6.2* 5.7* 5.3* 5.2*  ALBUMIN 2.1* 2.1* 1.9* 2.1*    Recent Labs Lab 03/24/16 0458 03/27/16 0536  AMMONIA 43* 24   Cardiac Enzymes: No results for input(s): CKTOTAL, CKMB, CKMBINDEX, TROPONINI in the last 168 hours.  CBG:  Recent Labs Lab 03/28/16 1639 03/28/16 1946 03/29/16 0001 03/29/16 0431 03/29/16 0727  GLUCAP 110* 107* 92 102* 101*    Recent Results (from the past 240 hour(s))  Blood culture (routine x 2)     Status: None    Collection Time: 03/20/16  6:00 PM  Result Value Ref Range Status   Specimen Description BLOOD RIGHT ARM  Final   Special Requests BOTTLES DRAWN AEROBIC AND ANAEROBIC 5CC  Final   Culture NO GROWTH 5 DAYS  Final   Report Status 03/25/2016 FINAL  Final  Blood culture (routine x 2)     Status: None   Collection Time: 03/20/16  6:10 PM  Result Value Ref Range Status   Specimen Description BLOOD RIGHT HAND  Final   Special Requests BOTTLES DRAWN AEROBIC AND ANAEROBIC 5CC  Final  Culture NO GROWTH 5 DAYS  Final   Report Status 03/25/2016 FINAL  Final  CSF culture     Status: None   Collection Time: 03/20/16 10:12 PM  Result Value Ref Range Status   Specimen Description CSF  Final   Special Requests TUBE 2  2CC  Final   Gram Stain   Final    CYTOSPIN SLIDE WBC PRESENT,BOTH PMN AND MONONUCLEAR NO ORGANISMS SEEN    Culture NO GROWTH 3 DAYS  Final   Report Status 03/24/2016 FINAL  Final  MRSA PCR Screening     Status: Abnormal   Collection Time: 03/20/16 11:00 PM  Result Value Ref Range Status   MRSA by PCR POSITIVE (A) NEGATIVE Final    Comment:        The GeneXpert MRSA Assay (FDA approved for NASAL specimens only), is one component of a comprehensive MRSA colonization surveillance program. It is not intended to diagnose MRSA infection nor to guide or monitor treatment for MRSA infections. RESULT CALLED TO, READ BACK BY AND VERIFIED WITH: DAVIS,E RN 0214 03/21/16 MITCHELL,L      Studies:   Recent x-ray studies have been reviewed in detail by the Attending Physician  Scheduled Meds:  Scheduled Meds: . antiseptic oral rinse  7 mL Mouth Rinse q12n4p  . aspirin  325 mg Per Tube Daily  . atorvastatin  20 mg Oral q1800  . carvedilol  3.125 mg Per Tube BID WC  . cefTRIAXone (ROCEPHIN)  IV  2 g Intravenous Q12H  . chlorhexidine  15 mL Mouth Rinse BID  . enoxaparin (LOVENOX) injection  40 mg Subcutaneous Q24H  . famotidine  20 mg Per Tube Daily  . feeding supplement  (PRO-STAT SUGAR FREE 64)  30 mL Per Tube Daily  . folic acid  1 mg Per Tube Daily  . free water  200 mL Per Tube Q4H  . levETIRAcetam  500 mg Per Tube BID  . methylPREDNISolone (SOLU-MEDROL) injection  125 mg Intravenous Q8H  . sodium chloride flush  10-40 mL Intracatheter Q12H  . thiamine  100 mg Per Tube Daily  . valproic acid  250 mg Per Tube Q8H    Time spent on care of this patient: 25 mins   Jalal Rauch , MD PhD [phone] [facility] Office  [phone] Pager - Text Page per Amion as per below:  On-Call/Text Page:      [email or web address]      password [email or web address]  If 7PM-7AM, please contact night-coverage [email or web address] Password [email or web address] 03/29/2016, 11:14 AM   LOS: 9 days

## 2016-03-29 NOTE — CV Procedure (Signed)
Procedure: TEE  Sedation: Versed 2 mg IV, Fentanyl 25 mcg   Indication: Assess for endocarditis.   Findings: Please see echo section for full report.  Normal LV size with mild diffuse hypokinesis, EF 45%.  Mildly dilated RV with normal systolic function.  Trivial MR, no MV vegetation.  No significant TR, no TV vegetation.  No PV vegetation.  Trileaflet aortic valve with no stenosis and trivial AI, no vegetation.  Normal left atrial size with no LA appendage thrombus.  Normal right atrium.  PICC line in RA, no vegetation.  No PFO/ASD, negative bubble study.  Normal caliber aorta with no plaque.    Impression: No evidence for endocarditis.   Marca AnconaDalton Blima Jaimes 03/29/2016 9:39 AM

## 2016-03-29 NOTE — Interval H&P Note (Signed)
History and Physical Interval Note:  03/29/2016 9:22 AM  Dale Hollowraig D [name]  has presented today for surgery, with the diagnosis of bacteremia  The various methods of treatment have been discussed with the patient and family. After consideration of risks, benefits and other options for treatment, the patient has consented to  Procedure(s): TRANSESOPHAGEAL ECHOCARDIOGRAM (TEE) (N/A) as a surgical intervention .  The patient's history has been reviewed, patient examined, no change in status, stable for surgery.  I have reviewed the patient's chart and labs.  Questions were answered to the patient's satisfaction.     Talasia Saulter [employer]

## 2016-03-30 ENCOUNTER — Encounter (HOSPITAL_COMMUNITY): Payer: Self-pay | Admitting: Cardiology

## 2016-03-30 LAB — GLUCOSE, CAPILLARY
GLUCOSE-CAPILLARY: 123 mg/dL — AB (ref 65–99)
GLUCOSE-CAPILLARY: 87 mg/dL (ref 65–99)
Glucose-Capillary: 113 mg/dL — ABNORMAL HIGH (ref 65–99)
Glucose-Capillary: 108 mg/dL — ABNORMAL HIGH (ref 65–99)
Glucose-Capillary: 124 mg/dL — ABNORMAL HIGH (ref 65–99)
Glucose-Capillary: 89 mg/dL (ref 65–99)

## 2016-03-30 MED ORDER — METHYLPREDNISOLONE SODIUM SUCC 125 MG IJ SOLR
80.0000 mg | Freq: Three times a day (TID) | INTRAMUSCULAR | Status: DC
  Administered 2016-03-30 – 2016-03-31 (×3): 80 mg via INTRAVENOUS
  Filled 2016-03-30 (×3): qty 2

## 2016-03-30 NOTE — Clinical Social Work Note (Signed)
[facility]  Patient plans to return back to facility once he is ready for discharge.  CSW to continue to follow patient's progress.  Ervin KnackEric R. Sandara Tyree, MSW, Theresia MajorsLCSWA [phone] 03/30/2016 5:52 PM

## 2016-03-30 NOTE — Progress Notes (Signed)
Came in to give patient his medications noted the NG is out. MD paged to notify.

## 2016-03-30 NOTE — Progress Notes (Signed)
PROGRESS NOTE  Dale [name] ZOX:[id number] DOB: [birth year] DOA: 03/20/2016 PCP: No primary care provider on file.  Admit HPI / Brief Narrative: 50 yo M with hx homelessness, admitted 3-14 to 03-10-16 with streptococcal (CSF Pneumococcus R-PEN) meningitis. His course was complicated by v-fib and respiratory arrest, as well as ARF due to ATN. He received 14 days of ceftriaxone, and weekly IM Pen G (for positive RPR 1:2). He was d/c to SNF. Non-verbal. At SNF his mental status was stable.   He returned to the ED on 4-16 after being found down. He was hypothermic. He was admitted to critical care on 4/16 with Severe Sepsis and transferred to hospitalist service on 4/19 He was started on vanco/ceftriaxone. He had repeat LP showing 400 WBC (75%N), Prot > 600, Glc 37. He had solumedrol added. His CT head suggested subacute infarcts of basal ganglia. MRI also showed these and suggested them as residual from his previous meningitis. (per Dr. Ninetta Lights) TEE no endocarditis, palliative team for goal of care, family meeting on 4/26 per palliative team. 4/26, started steroid taper too  HPI/Subjective: No events overnight per Nursing, mumbles, tolerating Panda tube feeds  Assessment/Plan:  Severe sepsis - Recurrent meningitis +/- SBE -pneumococcus isolated from his blood and CSF on 02/16/16 -empiric coverage with vanc + rocephin continues , will need 4 weeks of abx per ID therefore PICC placed, Day 10/28 so far -blood culture no growth, TEE to r/o SBE on 4/25 which did not show evidence for endocarditis.  -ID following  Acute encephalopathy / L gaze preference / B basal ganglia abnormality / L corona radiata infarct -encephalopathy multifactorial to include severe sepsis, subacute infarct and likely some anoxic injury from cardiac arrest last admit -  -Neuro suspects infectious cerebral vasculitis and recommended steroids and Abx -felt that this is due to recurrent meningitis/inflammatory exudates from  recurrent streptococcal meningitis involving deep penetrating arteries - Neuro suggests steroids with Abx, then agreed to tapering Abx, has been getting IV solumedrol  Q8, will cut down to  Q8 and slowly taper off -Dysphagia on TFs via panda -for Family meeting with palliative care today  Chronic Systolic and Diastolic CHF  -EF 45-50% on TTE 4/18 (improved since March) - no evidence of signif volume overload at this time - wgt at d/c 4/11 ~56kg - no signif volume overload at this time   Acute renal failure -multifactorial to include sepsis, heart failure, dehydration - crt peaked at 1.83 - continues to improve slowly , cr 1.21 on 4/24 and 4/25, possible new baseline  Hypernatremia -due to true dehydration - increased free water via NG - na 142 on 4/25 -improved  Hypothermia -resolved   Non-severe (moderate) malnutrition in context of chronic illness -CoreTrack placed for feeding - cont tube feeds and free water  MRSA Screen +  Code Status: FULL Family Communication: no family contact today  Disposition Plan:  Palliative Care consultation  to address goals of care , family meeting on 4/26   Consultants:  Neurology  ID Palliative care  Procedures: 4/18 TTE - EF 45-50% - diffuse hypokinesis - grade 1 diastolic dysfunction  Antibiotics: Vancomycin 4/16 > Rocephin 4/16 >  DVT prophylaxis: lovenox   Objective: Blood pressure 133/77, pulse 70, temperature 97.7 F (36.5 C), temperature source Axillary, resp. rate 18, height  (1.727 m), weight 63.1 kg (139 lb 1.8 oz), SpO2 100 %.  Intake/Output Summary (Last 24 hours) at 03/30/16 0852 Last data filed at 03/30/16 0604  Gross per 24  hour  Intake    220 ml  Output   1800 ml  Net  -1580 ml   Exam: General: No acute respiratory distress - opens eyes today, mumbles and said" yes mam" once  HEENT: Panda tube noted Lungs: Clear to auscultation bilaterally - no wheeze  Cardiovascular: Regular rate and rhythm - no  M or rub  Abdomen: Nondistended, soft, bowel sounds positive, no ascites, no mass Extremities: No significant cyanosis, clubbing, or edema bilateral lower extremities, mittens on Neuro: limited exam, wouldn't follow commands, moves LLE involuntarily  Data Reviewed:  Basic Metabolic Panel:  Recent Labs Lab 03/25/16 0428 03/26/16 0315 03/27/16 0534 03/28/16 0400 03/29/16 0425  NA 146* 148* 147* 144 142  K 4.0 3.6 3.7 3.7 4.6  CL 114* 113* 108 105 101  CO2 19* 23 26 28 31   GLUCOSE 84 104* 120* 115* 106*  BUN 27* 27* 27* 26* 24*  CREATININE 1.51* 1.41* 1.27* 1.21 1.21  CALCIUM 8.6* 8.4* 8.3* 8.1* 8.1*  MG  --   --   --   --  2.1  PHOS 4.0  --   --   --   --     CBC:  Recent Labs Lab 03/24/16 0458 03/25/16 0428 03/26/16 0315 03/27/16 0534 03/28/16 0400 03/29/16 0425  WBC 14.8* 12.9* 10.8* 15.9* 17.5* 15.0*  NEUTROABS 13.1*  --   --   --   --   --   HGB 10.2* 10.5* 10.0* 10.3* 10.2* 10.4*  HCT 31.7* 30.9* 30.6* 31.0* 31.2* 31.2*  MCV 89.5 87.5 89.2 88.8 87.9 86.0  PLT 263 224 240 234 200 181    Liver Function Tests:  Recent Labs Lab 03/24/16 0458 03/26/16 0315 03/28/16 0400  AST 15 19 22   ALT 30 30 29   ALKPHOS 61 58 65  BILITOT 0.2* 0.3 0.3  PROT 5.7* 5.3* 5.2*  ALBUMIN 2.1* 1.9* 2.1*    Recent Labs Lab 03/24/16 0458 03/27/16 0536  AMMONIA 43* 24   Cardiac Enzymes: No results for input(s): CKTOTAL, CKMB, CKMBINDEX, TROPONINI in the last 168 hours.  CBG:  Recent Labs Lab 03/29/16 1621 03/29/16 2014 03/30/16 0018 03/30/16 0424 03/30/16 0724  GLUCAP 87 97 123* 113* 108*    Recent Results (from the past 240 hour(s))  Blood culture (routine x 2)     Status: None   Collection Time: 03/20/16  6:00 PM  Result Value Ref Range Status   Specimen Description BLOOD RIGHT ARM  Final   Special Requests BOTTLES DRAWN AEROBIC AND ANAEROBIC 5CC  Final   Culture NO GROWTH 5 DAYS  Final   Report Status 03/25/2016 FINAL  Final  Blood culture (routine x  2)     Status: None   Collection Time: 03/20/16  6:10 PM  Result Value Ref Range Status   Specimen Description BLOOD RIGHT HAND  Final   Special Requests BOTTLES DRAWN AEROBIC AND ANAEROBIC 5CC  Final   Culture NO GROWTH 5 DAYS  Final   Report Status 03/25/2016 FINAL  Final  CSF culture     Status: None   Collection Time: 03/20/16 10:12 PM  Result Value Ref Range Status   Specimen Description CSF  Final   Special Requests TUBE 2  2CC  Final   Gram Stain   Final    CYTOSPIN SLIDE WBC PRESENT,BOTH PMN AND MONONUCLEAR NO ORGANISMS SEEN    Culture NO GROWTH 3 DAYS  Final   Report Status 03/24/2016 FINAL  Final  MRSA PCR Screening  Status: Abnormal   Collection Time: 03/20/16 11:00 PM  Result Value Ref Range Status   MRSA by PCR POSITIVE (A) NEGATIVE Final    Comment:        The GeneXpert MRSA Assay (FDA approved for NASAL specimens only), is one component of a comprehensive MRSA colonization surveillance program. It is not intended to diagnose MRSA infection nor to guide or monitor treatment for MRSA infections. RESULT CALLED TO, READ BACK BY AND VERIFIED WITH: DAVIS,E RN 0214 03/21/16 MITCHELL,L      Studies:   Recent x-ray studies have been reviewed in detail by the Attending Physician  Scheduled Meds:  Scheduled Meds: . antiseptic oral rinse  7 mL Mouth Rinse q12n4p  . aspirin  325 mg Per Tube Daily  . atorvastatin  20 mg Oral q1800  . carvedilol  3.125 mg Per Tube BID WC  . cefTRIAXone (ROCEPHIN)  IV  2 g Intravenous Q12H  . chlorhexidine  15 mL Mouth Rinse BID  . enoxaparin (LOVENOX) injection  40 mg Subcutaneous Q24H  . famotidine  20 mg Per Tube Daily  . feeding supplement (PRO-STAT SUGAR FREE 64)  30 mL Per Tube Daily  . folic acid  1 mg Per Tube Daily  . free water  200 mL Per Tube Q4H  . levETIRAcetam  500 mg Per Tube BID  . methylPREDNISolone (SOLU-MEDROL) injection  125 mg Intravenous Q8H  . sodium chloride flush  10-40 mL Intracatheter Q12H  .  thiamine  100 mg Per Tube Daily  . valproic acid  250 mg Per Tube Q8H    Time spent on care of this patient: 25 mins   Elmus Mathes , MD  [phone] [facility] Office  [phone] Pager - Text Page per Amion as per below:  On-Call/Text Page:      [email or web address]      password [email or web address]  If 7PM-7AM, please contact night-coverage [email or web address] Password [email or web address] 03/30/2016, 8:52 AM   LOS: 10 days

## 2016-03-31 ENCOUNTER — Inpatient Hospital Stay (HOSPITAL_COMMUNITY): Payer: Medicaid Other

## 2016-03-31 LAB — CBC
HCT: 30.8 % — ABNORMAL LOW (ref 39.0–52.0)
HEMOGLOBIN: 10.4 g/dL — AB (ref 13.0–17.0)
MCH: 28.6 pg (ref 26.0–34.0)
MCHC: 33.8 g/dL (ref 30.0–36.0)
MCV: 84.6 fL (ref 78.0–100.0)
PLATELETS: 195 10*3/uL (ref 150–400)
RBC: 3.64 MIL/uL — AB (ref 4.22–5.81)
RDW: 13.6 % (ref 11.5–15.5)
WBC: 16.7 10*3/uL — AB (ref 4.0–10.5)

## 2016-03-31 LAB — GLUCOSE, CAPILLARY
GLUCOSE-CAPILLARY: 81 mg/dL (ref 65–99)
GLUCOSE-CAPILLARY: 75 mg/dL (ref 65–99)
GLUCOSE-CAPILLARY: 102 mg/dL — AB (ref 65–99)
Glucose-Capillary: 115 mg/dL — ABNORMAL HIGH (ref 65–99)
Glucose-Capillary: 124 mg/dL — ABNORMAL HIGH (ref 65–99)
Glucose-Capillary: 88 mg/dL (ref 65–99)

## 2016-03-31 LAB — BASIC METABOLIC PANEL
ANION GAP: 11 (ref 5–15)
BUN: 27 mg/dL — ABNORMAL HIGH (ref 6–20)
CHLORIDE: 103 mmol/L (ref 101–111)
CO2: 28 mmol/L (ref 22–32)
Calcium: 8.3 mg/dL — ABNORMAL LOW (ref 8.9–10.3)
Creatinine, Ser: 1.07 mg/dL (ref 0.61–1.24)
GFR calc Af Amer: >60 mL/min (ref >60)
Glucose, Bld: 87 mg/dL (ref 65–99)
POTASSIUM: 4.2 mmol/L (ref 3.5–5.1)
SODIUM: 142 mmol/L (ref 135–145)

## 2016-03-31 MED ORDER — DEXTROSE 5 % IV SOLN
2.0000 g | Freq: Two times a day (BID) | INTRAVENOUS | Status: DC
  Administered 2016-03-31 – 2016-04-05 (×11): 2 g via INTRAVENOUS
  Filled 2016-03-31 (×12): qty 2

## 2016-03-31 MED ORDER — METHYLPREDNISOLONE SODIUM SUCC 125 MG IJ SOLR
80.0000 mg | Freq: Three times a day (TID) | INTRAMUSCULAR | Status: DC
  Administered 2016-03-31 – 2016-04-01 (×3): 80 mg via INTRAVENOUS
  Filled 2016-03-31 (×3): qty 2

## 2016-03-31 MED ORDER — RESOURCE THICKENUP CLEAR PO POWD
ORAL | Status: DC | PRN
  Filled 2016-03-31: qty 125

## 2016-03-31 NOTE — Progress Notes (Signed)
PROGRESS NOTE  Dale [name] ZOX:[id number] DOB: Nov 08, 1966 DOA: 03/20/2016 PCP: No primary care provider on file.  Admit HPI / Brief Narrative: 50 yo M with hx homelessness, admitted 3-14 to 03-10-16 with streptococcal (CSF Pneumococcus R-PEN) meningitis. His course was complicated by v-fib and respiratory arrest, as well as ARF due to ATN. He received 14 days of ceftriaxone, and weekly IM Pen G (for positive RPR 1:2). He was d/c to SNF. Non-verbal. At SNF his mental status was stable.  He returned to the ED on 4-16 after being found down. He was hypothermic. He was admitted to critical care on 4/16 with Severe Sepsis and transferred to hospitalist service on 4/19 He was started on vanco/ceftriaxone. He had repeat LP showing 400 WBC (75%N), Prot > 600, Glc 37. He had solumedrol added. His CT head suggested subacute infarcts of basal ganglia. MRI also showed these and suggested them as residual from his previous meningitis. (per Dr. Ninetta Lights) TEE no endocarditis, palliative team for goal of care, family meeting pending 4/26, started steroid tapering started  HPI/Subjective: Patient pulled out feeding  tube last night. Speech therapy evaluated and started the patient on dysphagia 1 diet with nectar thick liquid. Palliative care meeting could not take this as palliative team was unable to contact family.   Assessment/Plan:  Severe sepsis - Recurrent meningitis +/- SBE -pneumococcus isolated from his blood and CSF on 02/16/16 -empiric coverage with vanc + rocephin continues , will need 4 weeks of abx per ID therefore PICC placed, Day 10/28 so far -blood culture no growth, TEE to r/o SBE on 4/25 which did not show evidence for endocarditis.  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 -   Chronic Systolic and Diastolic CHF  -EF 45-50% on TTE 4/18 (improved since March) - no evidence of signif volume overload at this time - wgt at d/c 4/11 ~56kg - no signif volume overload at this time   Acute renal failure -multifactorial to include sepsis, heart failure, dehydration - crt peaked at 1.83 - continues to improve slowly , cr 1.07 today  Hypernatremia -due to true dehydration - sodium 142 today. -improved - We'll follow BMP in a.m. Might go up as feeding   tube is out.  Hypothermia -resolved   Non-severe (moderate) malnutrition in context of chronic illness -CoreTrack was placed for feeding. Patient pulled out the tube last night  Goals of care- called and discussed with palliative care NP Yong Channel, she will try  to contact the family today for goals of care discussion.  MRSA Screen +  Code Status: FULL Family Communication: no family contact today  Disposition Plan:  Palliative Care consultation  to address goals of care , family meeting  pending  Consultants:  Neurology  ID Palliative care  Procedures: 4/18 TTE - EF 45-50% - diffuse hypokinesis - grade 1 diastolic dysfunction  Antibiotics: Vancomycin 4/16 > Rocephin  4/16 >  DVT prophylaxis: lovenox   Objective: Blood pressure 146/73, pulse 68, temperature 97.7 F (36.5 C), temperature source Oral, resp. rate 18, height 5\' 8"  (1.727 m), weight 63.1 kg (139 lb 1.8 oz), SpO2 94 %.  Intake/Output Summary (Last 24 hours) at 03/31/16 1245 Last data filed at  03/31/16 0208  Gross per 24 hour  Intake      0 ml  Output    900 ml  Net   -900 ml   Exam: General: No acute respiratory distress - opens eyes today, mumbles and said" yes mam" once  Lungs: Clear to auscultation bilaterally - no wheeze  Cardiovascular: Regular rate and rhythm - no M or rub  Abdomen: Nondistended, soft, bowel sounds positive, no ascites, no mass Extremities: No significant cyanosis, clubbing, or edema bilateral lower extremities, mittens on Neuro: Lethargic, not following commands  Data Reviewed:  Basic Metabolic Panel:  Recent Labs Lab 03/25/16 0428 03/26/16 0315 03/27/16 0534 03/28/16 0400 03/29/16 0425 03/31/16 0716  NA 146* 148* 147* 144 142 142  K 4.0 3.6 3.7 3.7 4.6 4.2  CL 114* 113* 108 105 101 103  CO2 19* 23 26 28 31 28   GLUCOSE 84 104* 120* 115* 106* 87  BUN 27* 27* 27* 26* 24* 27*  CREATININE 1.51* 1.41* 1.27* 1.21 1.21 1.07  CALCIUM 8.6* 8.4* 8.3* 8.1* 8.1* 8.3*  MG  --   --   --   --  2.1  --   PHOS 4.0  --   --   --   --   --     CBC:  Recent Labs Lab 03/26/16 0315 03/27/16 0534 03/28/16 0400 03/29/16 0425 03/31/16 0716  WBC 10.8* 15.9* 17.5* 15.0* 16.7*  HGB 10.0* 10.3* 10.2* 10.4* 10.4*  HCT 30.6* 31.0* 31.2* 31.2* 30.8*  MCV 89.2 88.8 87.9 86.0 84.6  PLT 240 234 200 181 195    Liver Function Tests:  Recent Labs Lab 03/26/16 0315 03/28/16 0400  AST 19 22  ALT 30 29  ALKPHOS 58 65  BILITOT 0.3 0.3  PROT 5.3* 5.2*  ALBUMIN 1.9* 2.1*    Recent Labs Lab 03/27/16 0536  AMMONIA 24   Cardiac Enzymes: No results for input(s): CKTOTAL, CKMB, CKMBINDEX, TROPONINI in the last 168 hours.  CBG:  Recent Labs Lab 03/30/16 2014 03/31/16 0032 03/31/16 0428 03/31/16 0729 03/31/16 1157  GLUCAP 89 88 81 75 102*      Studies:   Recent x-ray studies have been reviewed in detail by the Attending Physician  Scheduled Meds:  Scheduled Meds: . aspirin  325 mg Per Tube Daily  . atorvastatin  20 mg Oral q1800  .  carvedilol  3.125 mg Per Tube BID WC  . cefTRIAXone (ROCEPHIN)  IV  2 g Intravenous Q12H  . enoxaparin (LOVENOX) injection  40 mg Subcutaneous Q24H  . famotidine  20 mg Per Tube Daily  . feeding supplement (PRO-STAT SUGAR FREE 64)  30 mL Per Tube Daily  . folic acid  1 mg Per Tube Daily  . levETIRAcetam  500 mg Per Tube BID  . methylPREDNISolone (SOLU-MEDROL) injection  80 mg Intravenous Q8H  . sodium chloride flush  10-40 mL Intracatheter Q12H  . thiamine  100 mg Per Tube Daily  . valproic acid  250 mg Per Tube Q8H    Time spent on care of this patient: 25 mins   LAMA,GAGAN S , MD  [phone] [facility] Office  [phone] Pager - Text Page  per Amion as per below:  On-Call/Text Page:      [email or web address]      password [email or web address]  If 7PM-7AM, please contact night-coverage [email or web address] Password [email or web address] 03/31/2016, 12:45 PM   LOS: 11 days

## 2016-03-31 NOTE — Clinical Social Work Placement (Signed)
   CLINICAL SOCIAL WORK PLACEMENT  NOTE  Date:  03/31/2016  Patient Details  Name: Dale [name] MRN: [id number] Date of Birth: 03/02/1966  Clinical Social Work is seeking post-discharge placement for this patient at the Skilled  Nursing Facility level of care (*CSW will initial, date and re-position this form in  chart as items are completed):  Yes   Patient/family provided with [place] Clinical Social Work Department's list of facilities offering this level of care within the geographic area requested by the patient (or if unable, by the patient's family).  Yes   Patient/family informed of their freedom to choose among providers that offer the needed level of care, that participate in Medicare, Medicaid or managed care program needed by the patient, have an available bed and are willing to accept the patient.  Yes   Patient/family informed of [place]'s ownership interest in [facility] and [facility], as well as of the fact that they are under no obligation to receive care at these facilities.  PASRR submitted to EDS on       PASRR number received on       Existing PASRR number confirmed on 03/31/16     FL2 transmitted to all facilities in geographic area requested by pt/family on 03/31/16     FL2 transmitted to all facilities within larger geographic area on       Patient informed that his/her managed care company has contracts with or will negotiate with certain facilities, including the following:            Patient/family informed of bed offers received.  Patient chooses bed at       Physician recommends and patient chooses bed at      Patient to be transferred to   on  .  Patient to be transferred to facility by       Patient family notified on   of transfer.  Name of family member notified:        PHYSICIAN       Additional Comment:    _______________________________________________ Rondel BatonIngle, Akili Corsetti C, LCSW 03/31/2016, 9:58 AM

## 2016-03-31 NOTE — NC FL2 (Deleted)
[place] MEDICAID FL2 LEVEL OF CARE SCREENING TOOL     IDENTIFICATION  Patient Name: Dale [name] Birthdate: 10/28/1966 Sex: male Admission Date (Current Location): 03/20/2016  [facility] [place]   Facility and Address:  [facility], [address], [place], [place] [address]      Provider Number: [id number]  Attending Physician Name and Address:  Meredeth IdeGagan S Lama, MD  Relative Name and Phone Number:   Talbert Forest(Shirley, wife [phone]; daughter Karel Jarvisbony [phone])    Current Level of Care: Hospital Recommended Level of Care: Skilled Nursing Facility Prior Approval Number:    Date Approved/Denied:   PASRR Number: [id number]  Discharge Plan: SNF    Current Diagnoses: Patient Active Problem List   Diagnosis Date Noted  . Cerebral septic emboli (HCC)   . Sepsis (HCC)   . Cerebral thrombosis with cerebral infarction 03/25/2016  . Meningoencephalitis   . Encephalopathy acute   . Bacterial meningitis   . Acute ischemic stroke (HCC)   . Malnutrition of moderate degree 03/24/2016  . Severe sepsis (HCC)   . Meningitis, pneumococcal, recurrent   . Systolic and diastolic CHF, chronic (HCC)   . Acute renal failure (HCC)   . Hypernatremia   . Hypothermia 03/20/2016  . NSTEMI (non-ST elevated myocardial infarction) (HCC) 03/14/2016  . Physical deconditioning 03/14/2016  . Tracheostomy status (HCC)   . Encounter for palliative care   . Goals of care, counseling/discussion   . Tracheostomy care (HCC)   . Respiratory failure (HCC) 03/03/2016  . Acute respiratory failure with hypoxia (HCC)   . Septic shock (HCC)   . Cardiogenic shock (HCC)   . Acute systolic heart failure (HCC)   . Cardiac arrest (HCC)   . Hydrocephalus   . Altered mental status   . CHF (congestive heart failure) (HCC)   . 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 Homeless 02/16/2016  . Meningitis, streptococcal 02/16/2016  . Tobacco abuse 02/16/2016  . AKI (acute kidney  injury) (HCC) 02/16/2016  . Gram positive sepsis (HCC) 02/16/2016  . Encephalopathy, metabolic 02/16/2016  . Underweight 02/16/2016  . ETOH abuse   . Substance abuse   . Acute encephalopathy   . Acute respiratory failure (HCC)     Orientation RESPIRATION BLADDER Height & Weight     Self  Normal Continent, Indwelling catheter Weight: 139 lb 1.8 oz (63.1 kg) Height:  5\' 8"  (172.7 cm)  BEHAVIORAL SYMPTOMS/MOOD NEUROLOGICAL BOWEL NUTRITION STATUS      Continent  (Feeding tube- please check dc summary for dietary needs at time of discharge as this is subject to change)  AMBULATORY STATUS COMMUNICATION OF NEEDS Skin   Limited Assist Non-Verbally Normal                       Personal Care Assistance Level of Assistance  Bathing, Feeding, Dressing Bathing Assistance: Limited assistance Feeding assistance: Limited assistance Dressing Assistance: Limited assistance     Functional Limitations Info             SPECIAL CARE FACTORS FREQUENCY  OT (By licensed OT), PT (By licensed PT)     PT Frequency: 5x a weej OT Frequency: daily     Speech Therapy Frequency: daily      Contractures Contractures Info: Present    Additional Factors Info  Isolation Precautions Code Status Info: FULL Allergies Info: NKA     Isolation Precautions Info: MRSA     Current Medications (03/31/2016):  This is the current hospital active medication list Current Facility-Administered Medications  Medication Dose Route Frequency Provider Last Rate Last Dose  . 0.45 % sodium chloride infusion   Intravenous Continuous Lonia Blood, MD 10 mL/hr at 03/29/16 1623    . antiseptic oral rinse (CPC / CETYLPYRIDINIUM CHLORIDE 0.05%) solution 7 mL  7 mL Mouth Rinse q12n4p Drema Dallas, MD   7 mL at 03/30/16 1457  . aspirin tablet 325 mg  325 mg Per Tube Daily Lonia Blood, MD   325 mg at 03/30/16 0944  . atorvastatin (LIPITOR) tablet 20 mg  20 mg Oral q1800 David L Rinehuls, PA-C   20 mg at  03/29/16 1607  . carvedilol (COREG) tablet 3.125 mg  3.125 mg Per Tube BID WC Lonia Blood, MD   3.125 mg at 03/30/16 0824  . cefTRIAXone (ROCEPHIN) 2 g in dextrose 5 % 50 mL IVPB  2 g Intravenous Q12H Meredeth Ide, MD      . chlorhexidine (PERIDEX) 0.12 % solution 15 mL  15 mL Mouth Rinse BID Drema Dallas, MD   15 mL at 03/30/16 0823  . enoxaparin (LOVENOX) injection 40 mg  40 mg Subcutaneous Q24H Orville Govern, MD   40 mg at 03/29/16 2143  . famotidine (PEPCID) 40 MG/5ML suspension 20 mg  20 mg Per Tube Daily Lonia Blood, MD   20 mg at 03/30/16 0824  . feeding supplement (JEVITY 1.2 CAL) liquid 1,000 mL  1,000 mL Per Tube Continuous Lonia Blood, MD 65 mL/hr at 03/30/16 0944 1,000 mL at 03/30/16 0944  . feeding supplement (PRO-STAT SUGAR FREE 64) liquid 30 mL  30 mL Per Tube Daily Lonia Blood, MD   30 mL at 03/30/16 0824  . folic acid (FOLVITE) tablet 1 mg  1 mg Per Tube Daily Lonia Blood, MD   1 mg at 03/30/16 0824  . free water 200 mL  200 mL Per Tube Q4H Lonia Blood, MD   200 mL at 03/30/16 1151  . levETIRAcetam (KEPPRA) 100 MG/ML solution 500 mg  500 mg Per Tube BID Lonia Blood, MD   500 mg at 03/30/16 0824  . methylPREDNISolone sodium succinate (SOLU-MEDROL) 125 mg/2 mL injection 80 mg  80 mg Intravenous Q8H Meredeth Ide, MD      . sodium chloride flush (NS) 0.9 % injection 10-40 mL  10-40 mL Intracatheter Q12H Lonia Blood, MD   10 mL at 03/30/16 0604  . sodium chloride flush (NS) 0.9 % injection 10-40 mL  10-40 mL Intracatheter PRN Lonia Blood, MD   10 mL at 03/29/16 0506  . thiamine (VITAMIN B-1) tablet 100 mg  100 mg Per Tube Daily Lonia Blood, MD   100 mg at 03/30/16 1610  . valproic acid (DEPAKENE) 250 MG/5ML syrup 250 mg  250 mg Per Tube Q8H Lonia Blood, MD   250 mg at 03/30/16 1457     Discharge Medications: Please see discharge summary for a list of discharge medications.  Relevant Imaging  Results:  Relevant Lab Results:   Additional Information SSN [id number], Tiaunna Buford C, LCSW

## 2016-03-31 NOTE — Progress Notes (Signed)
Given h/o dysphagia and pt pulled out feeding tube per RN, will plan MBS today  to allow instrumental evaluation.   Donavan Burnetamara Parth Mccormac, MS [facility] SLP [phone]

## 2016-03-31 NOTE — Consult Note (Signed)
Consultation Note Date: 03/31/2016   Patient Name: Dale [name]  DOB: 06/21/1966  MRN: [id number]  Age / Sex: 50 y.o., male  PCP: No primary care provider on file. Referring Physician: Meredeth IdeGagan S Lama, MD  Reason for Consultation: Establishing goals of care  HPI/Patient Profile: 50 y.o. male  with past medical history of recent admission with streptococcal meningitis. His course was complicated by v-fib and respiratory arrest, as well as ARF due to ATN. He received 14 days of ceftriaxone, and weekly IM Pen G (for positive RPR). He was d/c to SNF and was non-verbal. Admitted on 03/20/2016 with sepsis r/t recurrent meningitis and encephalopathy. 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   Clinical Assessment and Goals of Care: Dale [name] is more alert today and makes eye contact and speaks but remains very confused. Speaks in a mumble and tells me he is at his grandmother's house. I explain to him he has an infection and is in the hospital. He follows some simple commands inconsistently. I did speak with his wife (although they have been separated 21 years she says but remain legally married). She tells me that her family was unaware that he was homeless prior to his recent admission. She expresses much concern over his placement at SNF and does not wish for him to return to the same SNF but is open to exploring other options. I explained the complex injuries he has had neurologically and with repeated insults I am concerned about his ability to recover. 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 Dale [name] is very spiritual and tells me that she has  liver cancer herself but believes that God will heal them. When I began to speak of prognosis and future needs including feeding tube/PEG if they wish to continue aggressive treatment as she is saying she abruptly ended conversation but did say she would meet me tomorrow at 9am to further discuss. At this point his wife is requesting continued aggressive care but will need to further address nutrition needs (i.e. PEG) and code status specifically tomorrow.   NEXT OF KIN - wife is currently decision maker although they have been separated they have remained legally married    Code Status/Advance Care Planning:  Full code   Symptom Management:   None to manage currently.   Palliative Prophylaxis:   Bowel Regimen, Delirium Protocol, Frequent Pain Assessment, Oral Care and Turn Reposition  Additional Recommendations (Limitations, Scope, Preferences):  Full Scope Treatment  Psycho-social/Spiritual:   Desire for further Chaplaincy support:no  Additional Recommendations: Caregiving  Support/Resources and Education on Hospice  Prognosis:   Unable to determine - although given the trauma and repeated neurological injury prognosis remains very poor.   Discharge Planning: To Be Determined - likely SNF with palliative  Primary Diagnoses: Present on Admission:  . Hypothermia  I have reviewed the medical record, interviewed the patient and family, and examined the patient. The following aspects are pertinent.  Past Medical History  Diagnosis Date  . Depression   . Cardiac arrest (HCC)   . Cardiogenic shock (HCC)   . Acute respiratory failure with hypoxia (HCC)   . Meningitis, streptococcal 02/16/2016  . 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 Tobacco abuse 02/16/2016  . Substance abuse   . ETOH abuse   . Septic shock [facility])    Social History   Social History  . Marital Status: Legally Separated    Spouse Name: N/A  . Number of Children: N/A  . Years of Education:  N/A   Social History Main Topics  . Smoking status: Current Every Day Smoker -- 0.10 packs/day    Types: Cigarettes  . Smokeless tobacco: None  . Alcohol Use: Yes  . Drug Use: Yes    Special: Marijuana  . Sexual Activity: Not Asked   Other Topics Concern  . None   Social History Narrative   History reviewed. No pertinent family history. Scheduled Meds: . aspirin  325 mg Per Tube Daily  . atorvastatin  20 mg Oral q1800  . carvedilol  3.125 mg Per Tube BID WC  . cefTRIAXone (ROCEPHIN)  IV  2 g Intravenous Q12H  . enoxaparin (LOVENOX) injection  40 mg Subcutaneous Q24H  . famotidine  20 mg Per Tube Daily  . feeding supplement (PRO-STAT SUGAR FREE 64)  30 mL Per Tube Daily  . folic acid  1 mg Per Tube Daily  . levETIRAcetam  500 mg Per Tube BID  . methylPREDNISolone (SOLU-MEDROL) injection  80 mg Intravenous Q8H  . sodium chloride flush  10-40 mL Intracatheter Q12H  . thiamine  100 mg Per Tube Daily  . valproic acid  250 mg Per Tube Q8H   Continuous Infusions: . sodium chloride 10 mL/hr at 03/29/16 1623  . feeding supplement (JEVITY 1.2 CAL) 1,000 mL (03/30/16 0944)   PRN Meds:.RESOURCE THICKENUP CLEAR, sodium chloride flush Medications Prior to Admission:  Prior to Admission medications   Medication Sig Start Date End Date Taking? Authorizing Provider  acetaminophen (TYLENOL) 325 MG tablet Take 650 mg by mouth every 6 (six) hours as needed (pain/fever).    Yes Historical Provider, MD  albuterol (PROVENTIL) (2.5 MG/3ML) 0.083% nebulizer solution Take 3 mLs (2.5 mg total) by nebulization every 3 (three) hours as needed for wheezing. Patient taking differently: Take 2.5 mg by nebulization every 3 (three) hours as needed for wheezing or shortness of breath.  03/10/16  Yes Richarda Overlie, MD  amiodarone (PACERONE) 200 MG tablet Take 1 tablet (200 mg total) by mouth daily. 03/10/16  Yes Richarda Overlie, MD  aspirin 81 MG chewable tablet Chew 1 tablet (81 mg total) by mouth daily. 03/10/16   Yes Richarda Overlie, MD  carvedilol (COREG) 6.25 MG tablet Take 1 tablet (6.25 mg total) by mouth 2 (two) times daily with a meal. 03/10/16  Yes Richarda Overlie, MD  folic acid (FOLVITE) 1 MG tablet Take 1 tablet (1 mg total) by mouth daily. 03/10/16  Yes Richarda Overlie, MD  Nutritional Supplements (NUTRITIONAL SUPPLEMENT PO) Take 1 each by mouth 3 (three) times daily. 2 calorie milk shake   Yes Historical Provider, MD  pantoprazole (PROTONIX) 40 MG tablet Take 40 mg by mouth daily.   Yes Historical Provider, MD  thiamine 100 MG tablet Take 1 tablet (100 mg total)  by mouth daily. 03/10/16  Yes Richarda Overlie, MD  traMADol (ULTRAM) 50 MG tablet Take 1 tablet (50 mg total) by mouth every 6 (six) hours as needed. Patient taking differently: Take 50 mg by mouth every 6 (six) hours as needed (pain).  03/10/16  Yes Richarda Overlie, MD   No Known Allergies Review of Systems  Unable to perform ROS: Mental status change    Physical Exam  Constitutional: He appears ill.  Thin, malnourished  Cardiovascular: Normal rate.   Pulmonary/Chest: Breath sounds normal. No accessory muscle usage. No tachypnea. No respiratory distress.  Abdominal: Soft. Normal appearance.  Neurological: He is alert. He is disoriented.    Vital Signs: BP 136/76 mmHg  Pulse 70  Temp(Src) 98 F (36.7 C) (Oral)  Resp 18  Ht  (1.727 m)  Wt 63.1 kg (139 lb 1.8 oz)  BMI 21.16 kg/m2  SpO2 95% Pain Assessment: No/denies pain   Pain Score: 0-No pain   SpO2: SpO2: 95 % O2 Device:SpO2: 95 % O2 Flow Rate: .O2 Flow Rate (L/min): 3 L/min  IO: Intake/output summary:  Intake/Output Summary (Last 24 hours) at 03/31/16 1534 Last data filed at 03/31/16 0208  Gross per 24 hour  Intake      0 ml  Output    900 ml  Net   -900 ml    LBM: Last BM Date: 03/29/16 Baseline Weight: Weight: 59 kg (130 lb 1.1 oz) Most recent weight: Weight: 63.1 kg (139 lb 1.8 oz)     Palliative Assessment/Data:     Time In: 1450 Time Out: 1540 Time Total:  Greater than 50%  of this time was spent counseling and coordinating care related to the above assessment and plan.  Signed by: Ulice Bold, NP   Please contact Palliative Medicine Team phone at [phone] for questions and concerns.  For individual provider: See Loretha Stapler

## 2016-03-31 NOTE — Clinical Social Work Note (Signed)
Clinical Social Work Assessment  Patient Details  Name: Dale [name] MRN: [id number] Date of Birth: August 06, 1966  Date of referral:  03/31/16               Reason for consult:  Facility Placement                Permission sought to share information with:  Oceanographer granted to share information::  Yes, Verbal Permission Granted  Name::      Talbert Forest wife)  Agency::     Relationship::     Contact Information:     Housing/Transportation Living arrangements for the past 2 months:  Single Family Home Source of Information:  Spouse Patient Interpreter Needed:  None Criminal Activity/Legal Involvement Pertinent to Current Situation/Hospitalization:    Significant Relationships:  Spouse, Adult Children Lives with:  Self Do you feel safe going back to the place where you live?  No Need for family participation in patient care:  Yes (Comment) (patient alert though only oriented to self)  Care giving concerns:  Wife, Talbert Forest, is concerned that patient may have to return to [facility] where he had fallen prior to this admission.   Social Worker assessment / plan:  CSW received consult that patient was from [facility] and is in continued need for SNF/STR at time of discharge.  Patient is alert though only oriented to self at this time.  CSW contacted wife, Talbert Forest, who states they have been married for 30 years though they have been separated for 21 of those years.  Talbert Forest states they have one daughter together.  Talbert Forest states that they have remained close friends throughout their separation.  Talbert Forest is often at bedside though she does not drive.  Talbert Forest depends on friends for transportation.  Patient has been at [facility] since the end of March.  Talbert Forest states that the patient was "allowed to fall" at [facility] numerous time and hit his head.  Talbert Forest states she does not wish for patient to return to [facility].  CSW provided support and education  regarding possibility of patient's return. Patient's payer source is Medicaid pending- Talbert Forest is aware of lack of funding for STR and is aware that if the patient did not wish to return to [facility], he would potentially be going out of county.  Talbert Forest is open to exploring this possibility though states she is unsure whether she would choose this option over the return to [facility] because she can at least visit the patient since he is local.  CSW will send referrals and present bed offers once available.  Employment status:  Unemployed Health and safety inspector:  Self Pay (Medicaid Pending) PT Recommendations:  Skilled Nursing Facility Information / Referral to community resources:  Skilled Nursing Facility  Patient/Family's Response to care:  Wife is agreeable to SNF at time of discharge though wishes to explore other options than return to [facility].  Patient/Family's Understanding of and Emotional Response to Diagnosis, Current Treatment, and Prognosis:  Wife expressed concern regarding patient's return to SNF, though she is very realistic in regards to patient's need for STR/SNF at time of discharge.  Wife expressed frustration in regards to the lack of choices patient has due to lack of resources/funding for placement.  CSW offered support.  Emotional Assessment Appearance:  Appears older than stated age Attitude/Demeanor/Rapport:  Unable to Assess Affect (typically observed):  Unable to Assess Orientation:  Oriented to Self Alcohol / Substance use:  Not Applicable  Psych involvement (Current and /or in the community):  No (Comment)  Discharge Needs  Concerns to be addressed:  Financial / Insurance Concerns, Discharge Planning Concerns Readmission within the last 30 days:  Yes Current discharge risk:  Inadequate Financial Supports Barriers to Discharge:  Continued Medical Work up, Homeless with medical needs   Rondel Batonngle, Sherman Lipuma C, LCSW 03/31/2016, 9:45 AM

## 2016-03-31 NOTE — Progress Notes (Signed)
Objective Swallowing Evaluation: Type of Study: MBS-Modified Barium Swallow Study  Patient Details  Name: Dale HollowCraig D Grefe MRN: [id number] Date of Birth: 05/24/1966  Today's Date: 03/31/2016 Time: SLP Start Time (ACUTE ONLY): 0929-SLP Stop Time (ACUTE ONLY): 0946 SLP Time Calculation (min) (ACUTE ONLY): 17 min  Past Medical History:  Past Medical History  Diagnosis Date  . Depression   . Cardiac arrest (HCC)   . Cardiogenic shock (HCC)   . Acute respiratory failure with hypoxia (HCC)   . Meningitis, streptococcal 02/16/2016  . 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 Tobacco abuse 02/16/2016  . Substance abuse   . ETOH abuse   . Septic shock [facility](HCC)    Past Surgical History:  Past Surgical History  Procedure Laterality Date  . Ankle surgery    . Cardiac catheterization N/A 02/17/2016    Procedure: IABP Insertion;  Surgeon: Iran OuchMuhammad A Arida, MD;  Location: [facility] INVASIVE CV LAB;  Service: Cardiovascular;  Laterality: N/A;  . Tee without cardioversion N/A 03/29/2016    Procedure: TRANSESOPHAGEAL ECHOCARDIOGRAM (TEE);  Surgeon: Laurey Moralealton S McLean, MD;  [facility];  Service: Cardiovascular;  Laterality: N/A;   HPI: 50 y.o. male with h/o depression recently in hospital from 3/4-4/6 after for meningitis.  During that hospital admit, pt suffered meningitis, suffered cardiac arrest, cardiogenic shock with acute systolic CHF with trach placed 3/24 and pt was found to have dysphagia.  Pt returned 4/16 with hypothermia- severe sepsis.  Left gaze preference, basal ganglia abnormality, left corona radiata cva per notes.  Pt removed his tube for feeding last pm and swallow eval ordered.  MBS indicated due to h/o dysphagia and possible worsening with acute issues.    Subjective: pt awake in chair  Assessment / Plan / Recommendation  CHL IP CLINICAL IMPRESSIONS 03/31/2016  Therapy Diagnosis Moderate oropharyngeal dysphagia  Clinical Impression Pt exhibited mild oropharyngeal dysphagia with  sensorimotor deficits.  Weak lingual manipulation resulted in decreased bolus cohesion, premature spillage and decreased propulsion.  Pt required multilpe boluses of pudding to transit barium tablet - recommend crushed medications if not contranindicated.  Delayed swallow initiation to the vallecula and pyriform sinuses observed due to decreased sensation.  Due to pt's decreased mental status, delayed swallow response, weak cough and oral weakness- recommend Dysphagia 1 textures nectar thick liquid and meds crushed in puree.  Pt educated re: diet recommendation.  SLP will f/u to determine diet tolerance and advise dietary advancement.    Impact on safety and function Mod risk      CHL IP TREATMENT RECOMMENDATION 03/31/2016  Treatment Recommendations Therapy as outlined in treatment plan below     Prognosis 03/31/2016  Prognosis for Safe Diet Advancement Good  Barriers to Reach Goals Motivation  Barriers/Prognosis Comment --    CHL IP DIET RECOMMENDATION 03/31/2016  SLP Diet Recommendations Dysphagia 1 (Puree) solids;Nectar thick liquid  Liquid Administration via Cup;Straw  Medication Administration Crushed with puree  Compensations Slow rate;Small sips/bites  Postural Changes upright      CHL IP OTHER RECOMMENDATIONS 03/07/2016  Recommended Consults --  Oral Care Recommendations Oral care BID  Other Recommendations Order thickener from pharmacy;Prohibited food (jello, ice cream, thin soups);Remove water pitcher;Have oral suction available;Place PMSV during PO intake      CHL IP FOLLOW UP RECOMMENDATIONS 03/09/2016  Follow up Recommendations [facility] D[facility]/2016  Speech Therapy Frequency (ACUTE ONLY) min 2x/week  Treatment Duration 1 week  Luanna Salk, [facility] SLP [phone]

## 2016-04-01 DIAGNOSIS — Z515 Encounter for palliative care: Secondary | ICD-10-CM | POA: Insufficient documentation

## 2016-04-01 DIAGNOSIS — N170 Acute kidney failure with tubular necrosis: Secondary | ICD-10-CM

## 2016-04-01 LAB — GLUCOSE, CAPILLARY
GLUCOSE-CAPILLARY: 101 mg/dL — AB (ref 65–99)
Glucose-Capillary: 104 mg/dL — ABNORMAL HIGH (ref 65–99)
Glucose-Capillary: 115 mg/dL — ABNORMAL HIGH (ref 65–99)
Glucose-Capillary: 88 mg/dL (ref 65–99)
Glucose-Capillary: 94 mg/dL (ref 65–99)
Glucose-Capillary: 94 mg/dL (ref 65–99)

## 2016-04-01 LAB — BASIC METABOLIC PANEL
ANION GAP: 11 (ref 5–15)
BUN: 26 mg/dL — AB (ref 6–20)
CALCIUM: 8.5 mg/dL — AB (ref 8.9–10.3)
CO2: 28 mmol/L (ref 22–32)
CREATININE: 1.07 mg/dL (ref 0.61–1.24)
Chloride: 102 mmol/L (ref 101–111)
GFR calc Af Amer: >60 mL/min (ref >60)
GLUCOSE: 102 mg/dL — AB (ref 65–99)
Potassium: 4 mmol/L (ref 3.5–5.1)
Sodium: 141 mmol/L (ref 135–145)

## 2016-04-01 LAB — CBC
HCT: 31.5 % — ABNORMAL LOW (ref 39.0–52.0)
Hemoglobin: 10.4 g/dL — ABNORMAL LOW (ref 13.0–17.0)
MCH: 28.5 pg (ref 26.0–34.0)
MCHC: 33 g/dL (ref 30.0–36.0)
MCV: 86.3 fL (ref 78.0–100.0)
PLATELETS: 209 10*3/uL (ref 150–400)
RBC: 3.65 MIL/uL — AB (ref 4.22–5.81)
RDW: 13.7 % (ref 11.5–15.5)
WBC: 14 10*3/uL — ABNORMAL HIGH (ref 4.0–10.5)

## 2016-04-01 LAB — PHOSPHORUS: PHOSPHORUS: 4.4 mg/dL (ref 2.5–4.6)

## 2016-04-01 LAB — MAGNESIUM: MAGNESIUM: 2 mg/dL (ref 1.7–2.4)

## 2016-04-01 MED ORDER — TAMSULOSIN HCL 0.4 MG PO CAPS
0.4000 mg | ORAL_CAPSULE | Freq: Every day | ORAL | Status: DC
Start: 2016-04-01 — End: 2016-04-06
  Administered 2016-04-02 – 2016-04-05 (×4): 0.4 mg via ORAL
  Filled 2016-04-01 (×4): qty 1

## 2016-04-01 MED ORDER — ENSURE ENLIVE PO LIQD
237.0000 mL | Freq: Two times a day (BID) | ORAL | Status: DC
  Administered 2016-04-02: 237 mL via ORAL
  Filled 2016-04-01 (×4): qty 237

## 2016-04-01 MED ORDER — METHYLPREDNISOLONE SODIUM SUCC 125 MG IJ SOLR: 80.0000 mg | Freq: Every day | INTRAMUSCULAR | Status: DC

## 2016-04-01 NOTE — Progress Notes (Signed)
Speech Language Pathology Treatment: Dysphagia  Patient Details Name: Dale HollowCraig D Ventura MRN: [id number] DOB: 12/12/1965 Today's Date: 04/01/2016 Time: [id number] SLP Time Calculation (min) (ACUTE ONLY): 15 min  Assessment / Plan / Recommendation Clinical Impression  Pt consumed nectar-thickened liquids,puree and soft solids without overt s/s of aspiration noted; pt did exhibit reduced mastication (edentulous/cognitive status) and would frequently try and speak during po intake which places him at a more significant risk for aspiration during po intake; he did reduce this behavior when moderately verbally cued by SLP; liquid wash cleared oral residue to an insignificant amount (with soft solids); pt did not exhibit s/s of aspiration with thin liquids, but he is at risk d/t confusion/decreased mentation, so nectar-thickened liquids are recommended to continue with an upgraded diet of Dysphagia 2 (chopped) as pt has improved with mastication from previous swallowing assessment; swallow initiation seems to have improved as pt swallowed within 2-4 seconds of presentation with liquids/puree consistency with moderate verbal cues provided; he also swallowed solid consistency within 5-8 seconds of presentation, as compared with MBS results.  ST will con't to f/u for diet tolerance during stay in hospital.  ST f/u at SNF upon d/c recommended.   HPI HPI: 50 y.o. male with h/o depression recently in hospital from 3/4-4/6 after for meningitis.  During that hospital admit, pt suffered meningitis, suffered cardiac arrest, cardiogenic shock with acute systolic CHF with trach placed 3/24 and pt was found to have dysphagia.  Pt returned 4/16 with hypothermia- severe sepsis.  Left gaze preference, basal ganglia abnormality, left corona radiata cva per notes.  Pt removed his tube for feeding last pm and swallow eval ordered.  MBS indicated due to h/o dysphagia and possible worsening with acute issues.        SLP Plan  Continue  with current plan of care     Recommendations  Diet recommendations: Dysphagia 2 (fine chop);Nectar-thick liquid Liquids provided via: Cup;No straw Medication Administration: Crushed with puree Supervision: Staff to assist with self feeding Compensations: Slow rate;Small sips/bites Postural Changes and/or Swallow Maneuvers: Seated upright 90 degrees             Oral Care Recommendations: Oral care BID Follow up Recommendations: Skilled Nursing facility Plan: Continue with current plan of care                     Yves Fodor,PAT, M.S., CCC-SLP 04/01/2016, 12:04 PM

## 2016-04-01 NOTE — Progress Notes (Addendum)
Daily Progress Note   Patient Name: Dale [name]       Date: 04/01/2016 DOB: 01/28/1966  Age: 50 y.o. MRN#: [id number] Attending Physician: Leroy SeaPrashant K Singh, MD Primary Care Physician: No primary care provider on file. Admit Date: 03/20/2016  Reason for Consultation/Follow-up: Establishing goals of care  Subjective: Dale [name] was sleeping when we enter room but arouses to his wife's voice and responds to questions. He even makes a joke with her. He is still somewhat confused. Denies pain or discomfort. He had eaten ~70% of his breakfast (mostly everything except for his eggs).  I speak more with his wife Dale [name]. She is able to explain to me the damage that has been done neurologically to Dale [name] and our concern on medical limitations for great improvement. However she says that she and their children have discussed and what full aggressive care (including resuscitation if needed). When I allude to QOL especially with further complications or decline she tells me that knows God will not let that happen to him. She is deeply reliant on her faith that God will continue to heal him.   We also discussed artificial feeding and PEG if he is not able to maintain adequate intake or if swallow worsens. She was hesitant to answer insisting this will not be a problem but does say if this is needed they would want him to have feeding tube but as a last resort.    Length of Stay: 12  Current Medications: Scheduled Meds:  . aspirin  325 mg Per Tube Daily  . atorvastatin  20 mg Oral q1800  . carvedilol  3.125 mg Per Tube BID WC  . cefTRIAXone (ROCEPHIN)  IV  2 g Intravenous Q12H  . enoxaparin (LOVENOX) injection  40 mg Subcutaneous Q24H  . famotidine  20 mg Per Tube Daily  . [START ON 04/02/2016] feeding  supplement (ENSURE ENLIVE)  237 mL Oral BID BM  . folic acid  1 mg Per Tube Daily  . levETIRAcetam  500 mg Per Tube BID  . [START ON 04/02/2016] methylPREDNISolone (SOLU-MEDROL) injection  80 mg Intravenous Daily  . sodium chloride flush  10-40 mL Intracatheter Q12H  . tamsulosin  0.4 mg Oral Daily  . thiamine  100 mg Per Tube Daily  . valproic acid  250 mg Per Tube  Q8H    Continuous Infusions: . sodium chloride 10 mL/hr at 03/29/16 1623    PRN Meds: RESOURCE THICKENUP CLEAR, sodium chloride flush  Physical Exam  Constitutional: He is easily aroused. He appears ill.  Thin, malnourished  Cardiovascular: Normal rate.   Pulmonary/Chest: No accessory muscle usage. No tachypnea. No respiratory distress.  Abdominal: Normal appearance.  Neurological: He is alert and easily aroused. He is disoriented.            Vital Signs: BP 116/83 mmHg  Pulse 67  Temp(Src) 97.7 F (36.5 C) (Axillary)  Resp 16  Ht  (1.727 m)  Wt 60.2 kg (132 lb 11.5 oz)  BMI 20.18 kg/m2  SpO2 100% SpO2: SpO2: 100 % O2 Device: O2 Device: Not Delivered O2 Flow Rate: O2 Flow Rate (L/min): 3 L/min  Intake/output summary:  Intake/Output Summary (Last 24 hours) at 04/01/16 1722 Last data filed at 04/01/16 0531  Gross per 24 hour  Intake      0 ml  Output    800 ml  Net   -800 ml   LBM: Last BM Date: 03/29/16 Baseline Weight: Weight: 59 kg (130 lb 1.1 oz) Most recent weight: Weight: 60.2 kg (132 lb 11.5 oz)       Palliative Assessment/Data:      Patient Active Problem List   Diagnosis Date Noted  . Cerebral septic emboli (HCC)   . Sepsis (HCC)   . Cerebral thrombosis with cerebral infarction 03/25/2016  . Meningoencephalitis   . Encephalopathy acute   . Bacterial meningitis   . Acute ischemic stroke (HCC)   . Malnutrition of moderate degree 03/24/2016  . Severe sepsis (HCC)   . Meningitis, pneumococcal, recurrent   . Systolic and diastolic CHF, chronic (HCC)   . Acute renal failure (HCC)    . Hypernatremia   . Hypothermia 03/20/2016  . NSTEMI (non-ST elevated myocardial infarction) (HCC) 03/14/2016  . Physical deconditioning 03/14/2016  . Tracheostomy status (HCC)   . Encounter for palliative care   . Goals of care, counseling/discussion   . Tracheostomy care (HCC)   . Respiratory failure (HCC) 03/03/2016  . Acute respiratory failure with hypoxia (HCC)   . Septic shock (HCC)   . Cardiogenic shock (HCC)   . Acute systolic heart failure (HCC)   . Cardiac arrest (HCC)   . Hydrocephalus   . Altered mental status   . CHF (congestive heart failure) (HCC)   . 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 Homeless 02/16/2016  . Meningitis, streptococcal 02/16/2016  . Tobacco abuse 02/16/2016  . AKI (acute kidney injury) (HCC) 02/16/2016  . Gram positive sepsis (HCC) 02/16/2016  . Encephalopathy, metabolic 02/16/2016  . Underweight 02/16/2016  . ETOH abuse   . Substance abuse   . Acute encephalopathy   . 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   Assessment: Recurrent meningitis but with improving encephalopathy.   Recommendations/Plan:  Continue full aggressive care per family wishes. Currently no symptoms to manage.   Goals of Care and Additional Recommendations:  Limitations on Scope of Treatment: Full Scope Treatment  Code Status:    Code Status Orders        Start     Ordered   03/20/16 2135  Full code   Continuous     03/20/16 2135    Code Status History  Date Active Date Inactive Code Status Order ID Comments User Context   02/17/2016  1:31 PM 03/10/2016  6:52 PM DNR [id number]  Nelda Bucks, MD Inpatient   02/16/2016  7:13 PM 02/17/2016  1:31 PM Full Code [id number]  [facility]    Advance Directive Documentation        Most Recent Value   Type of Advance Directive  Out of facility  DNR (pink MOST or yellow form)   Pre-existing out of facility DNR order (yellow form or pink MOST form)  Yellow form placed in chart (order not valid for inpatient use)   "MOST" Form in Place?         Prognosis:   Unable to determine  Discharge Planning:  Skilled Nursing Facility for rehab with Palliative care service follow-up   Thank you for allowing the Palliative Medicine Team to assist in the care of this patient.   Time In: 0900 Time Out: 0945 Total Time Prolonged Time Billed  no       Greater than 50%  of this time was spent counseling and coordinating care related to the above assessment and plan.  Ulice Bold, NP  Please contact Palliative Medicine Team phone at [phone] for questions and concerns.

## 2016-04-01 NOTE — Progress Notes (Signed)
CS[facility] available. Per Mickie Bailosha, they are not able to take patients with no insurance nor is she willing to take an LOG. CSW to inform patient there are no available options, so [facility] or home with family.   CSW will continue to follow and provide support to patient and family while in hospital.   Fernande BoydenJoyce Oneka Parada, [facility] Center[facility]WA Clinical Social Worker [facility] Ph: [phone]

## 2016-04-01 NOTE — Progress Notes (Addendum)
PROGRESS NOTE                                                                                                                                                                                                             Patient Demographics:    Dale [name], is a 50 y.o. male, DOB - 1966-03-14, ZOX:[id number]  Admit date - 03/20/2016   Admitting Physician Orville Govern, MD  Outpatient Primary MD for the patient is No primary care provider on file.  LOS - 12  Outpatient Specialists:   Chief Complaint  Patient presents with  . Fall       Brief Narrative  50 yo M with hx homelessness, admitted 3-14 to 03-10-16 with streptococcal (CSF Pneumococcus R-PEN) meningitis. His course was complicated by v-fib and respiratory arrest, as well as ARF due to ATN. He received 14 days of ceftriaxone, and weekly IM Pen G (for positive RPR 1:2). He was d/c to SNF. Non-verbal. At SNF his mental status was stable.  He returned to the ED on 4-16 after being found down. He was hypothermic. He was admitted to critical care on 4/16 with Severe Sepsis and transferred to hospitalist service on 4/19 He was started on vanco/ceftriaxone. He had repeat LP showing 400 WBC (75%N), Prot > 600, Glc 37. He had solumedrol added. His CT head suggested subacute infarcts of basal ganglia. MRI also showed these and suggested them as residual from his previous meningitis. (per Dr. Ninetta Lights) TEE no endocarditis, palliative team for goal of care, family meeting pending 4/26, started steroid tapering started   Subjective:    [name] today has, No headache, No chest pain, No abdominal pain - No Nausea, No new weakness tingling or numbness, No Cough - SOB.    Assessment  & Plan :      1. 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 Patient has streptococcal pneumonia isolated from blood and CSF cultures on 02/16/2016 on a previous admission. Seen by ID. Initially was  treated with vancomycin. Per ID vancomycin was stopped Rocephin continued. PICC line placed. Stop date for IV Rocephin as 04/29/2016. Must follow with ID prior to stopping this medication. He had an unremarkable TEE. Clinically sepsis physiology has resolved.  2. Acute encephalopathy with right-sided hemi-neglect. L coronary at the infarct. Basal ganglia abnormalities noted on MRI. Also just above likely  previous meningitis effect. Neurology saw the patient, they question if patient has cerebral vasculitis due to underlying infections. They have recommended for steroids to be continued along with antibiotics as above. Neuro has recommended slow IV steroid taper over 2 weeks. Currently on Solu-Medrol 80 mg IV every 8, will change to daily.  3. Ur. retention. Placed on Flomax. Currently has Foley.  4. Dyslipidemia. On statin.  5. Dysphagia. Speech following. Underwent modified barium swallow. Currently on dysphagia 1 diet with nectar thick fluids.  6. Severe protein calorie malnutrition. On protein supplementation.  7. Hypernatremia and ARF. Due to dehydration. Resolved with IV fluids.  8. 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 Compensated. Currently on beta blocker, aspirin and statin.    Code Status : Full  Family Communication  : On present  Disposition Plan  : SNF in 1-2 days  Barriers For Discharge : Meningitis  Consults  :     Neurology  ID Palliative care  Procedures  :    4/18 TTE - EF 45-50% - diffuse hypokinesis - grade 1 diastolic dysfunction.  DVT Prophylaxis  :  Lovenox   Lab Results  Component Value Date   PLT 209 04/01/2016    Antibiotics  :    Anti-infectives    Start     Dose/Rate Route Frequency Ordered Stop   03/31/16 1800  cefTRIAXone (ROCEPHIN) 2 g in dextrose 5 % 50 mL IVPB     2 g 100 mL/hr over 30 Minutes Intravenous Every 12 hours 03/31/16 0739     03/29/16 0900  vancomycin (VANCOCIN) 1,500 mg in sodium chloride 0.9 % 500 mL IVPB   Status:  Discontinued     1,500 mg 250 mL/hr over 120 Minutes Intravenous Every 24 hours 03/28/16 0745 03/28/16 1140   03/28/16 0800  vancomycin (VANCOCIN) IVPB 1000 mg/200 mL premix     1,000 mg 200 mL/hr over 60 Minutes Intravenous  Once 03/28/16 0745 03/28/16 0920   03/28/16 0500  vancomycin (VANCOCIN) 500 mg in sodium chloride 0.9 % 100 mL IVPB  Status:  Discontinued     500 mg 100 mL/hr over 60 Minutes Intravenous Every 12 hours 03/27/16 1642 03/28/16 0745   03/24/16 1600  vancomycin (VANCOCIN) IVPB 1000 mg/200 mL premix  Status:  Discontinued     1,000 mg 200 mL/hr over 60 Minutes Intravenous Every 24 hours 03/24/16 1124 03/27/16 1642   03/21/16 1000  vancomycin (VANCOCIN) 500 mg in sodium chloride 0.9 % 100 mL IVPB  Status:  Discontinued     500 mg 100 mL/hr over 60 Minutes Intravenous Every 12 hours 03/20/16 1940 03/24/16 1113   03/21/16 0800  cefTRIAXone (ROCEPHIN) 2 g in dextrose 5 % 50 mL IVPB  Status:  Discontinued     2 g 100 mL/hr over 30 Minutes Intravenous Every 12 hours 03/21/16 0040 03/31/16 0739   03/20/16 2000  vancomycin (VANCOCIN) IVPB 1000 mg/200 mL premix     1,000 mg 200 mL/hr over 60 Minutes Intravenous NOW 03/20/16 1940 03/20/16 2103   03/20/16 1930  cefTRIAXone (ROCEPHIN) 2 g in dextrose 5 % 50 mL IVPB     2 g 100 mL/hr over 30 Minutes Intravenous  Once 03/20/16 1919 03/20/16 2030        Objective:   Filed Vitals:   04/01/16 0152 04/01/16 0500 04/01/16 0526 04/01/16 1012  BP: 134/87  138/56 122/72  Pulse: 80  55 56  Temp: 98.5 F (36.9 C)  98.2 F (36.8 C) 98.4 F (  36.9 C)  TempSrc: Oral  Oral Oral  Resp: 18  16 16   Height:      Weight:  60.2 kg (132 lb 11.5 oz)    SpO2: 100%  100% 100%    Wt Readings from Last 3 Encounters:  04/01/16 60.2 kg (132 lb 11.5 oz)  03/15/16 55.974 kg (123 lb 6.4 oz)  03/14/16 55.906 kg (123 lb 4 oz)     Intake/Output Summary (Last 24 hours) at 04/01/16 1313 Last data filed at 04/01/16 0531  Gross per 24 hour   Intake      0 ml  Output   2100 ml  Net  -2100 ml     Physical Exam  Awake, appears weak ++, No new F.N deficits,   [place].AT,PERRAL Supple Neck,No JVD, No cervical lymphadenopathy appriciated.  Symmetrical Chest wall movement, Good air movement bilaterally, CTAB RRR,No Gallops,Rubs or new Murmurs, No Parasternal Heave +ve B.Sounds, Abd Soft, No tenderness, No organomegaly appriciated, No rebound - guarding or rigidity. Foley No Cyanosis, Clubbing or edema, No new Rash or bruise      Data Review:    CBC  Recent Labs Lab 03/27/16 0534 03/28/16 0400 03/29/16 0425 03/31/16 0716 04/01/16 0500  WBC 15.9* 17.5* 15.0* 16.7* 14.0*  HGB 10.3* 10.2* 10.4* 10.4* 10.4*  HCT 31.0* 31.2* 31.2* 30.8* 31.5*  PLT 234 200 181 195 209  MCV 88.8 87.9 86.0 84.6 86.3  MCH 29.5 28.7 28.7 28.6 28.5  MCHC 33.2 32.7 33.3 33.8 33.0  RDW 14.6 14.1 13.8 13.6 13.7    Chemistries   Recent Labs Lab 03/26/16 0315 03/27/16 0534 03/28/16 0400 03/29/16 0425 03/31/16 0716 04/01/16 0500 04/01/16 0700  NA 148* 147* 144 142 142 141  --   K 3.6 3.7 3.7 4.6 4.2 4.0  --   CL 113* 108 105 101 103 102  --   CO2 23 26 28 31 28 28   --   GLUCOSE 104* 120* 115* 106* 87 102*  --   BUN 27* 27* 26* 24* 27* 26*  --   CREATININE 1.41* 1.27* 1.21 1.21 1.07 1.07  --   CALCIUM 8.4* 8.3* 8.1* 8.1* 8.3* 8.5*  --   MG  --   --   --  2.1  --   --  2.0  AST 19  --  22  --   --   --   --   ALT 30  --  29  --   --   --   --   ALKPHOS 58  --  65  --   --   --   --   BILITOT 0.3  --  0.3  --   --   --   --    ------------------------------------------------------------------------------------------------------------------ No results for input(s): CHOL, HDL, LDLCALC, TRIG, CHOLHDL, LDLDIRECT in the last 72 hours.  Lab Results  Component Value Date   HGBA1C 5.8* 03/27/2016   ------------------------------------------------------------------------------------------------------------------ No results for input(s):  TSH, T4TOTAL, T3FREE, THYROIDAB in the last 72 hours.  Invalid input(s): FREET3 ------------------------------------------------------------------------------------------------------------------ No results for input(s): VITAMINB12, FOLATE, FERRITIN, TIBC, IRON, RETICCTPCT in the last 72 hours.  Coagulation profile No results for input(s): INR, PROTIME in the last 168 hours.  No results for input(s): DDIMER in the last 72 hours.  Cardiac Enzymes No results for input(s): CKMB, TROPONINI, MYOGLOBIN in the last 168 hours.  Invalid input(s): CK ------------------------------------------------------------------------------------------------------------------    Component Value Date/Time   BNP 23.8 03/20/2016 1800    Inpatient Medications  Scheduled Meds: . aspirin  325 mg Per Tube Daily  . atorvastatin  20 mg Oral q1800  . carvedilol  3.125 mg Per Tube BID WC  . cefTRIAXone (ROCEPHIN)  IV  2 g Intravenous Q12H  . enoxaparin (LOVENOX) injection  40 mg Subcutaneous Q24H  . famotidine  20 mg Per Tube Daily  . feeding supplement (PRO-STAT SUGAR FREE 64)  30 mL Per Tube Daily  . folic acid  1 mg Per Tube Daily  . levETIRAcetam  500 mg Per Tube BID  . methylPREDNISolone (SOLU-MEDROL) injection  80 mg Intravenous Q8H  . sodium chloride flush  10-40 mL Intracatheter Q12H  . thiamine  100 mg Per Tube Daily  . valproic acid  250 mg Per Tube Q8H   Continuous Infusions: . sodium chloride 10 mL/hr at 03/29/16 1623  . feeding supplement (JEVITY 1.2 CAL) 1,000 mL (03/30/16 0944)   PRN Meds:.RESOURCE THICKENUP CLEAR, sodium chloride flush  Micro Results No results found for this or any previous visit (from the past 240 hour(s)).  Radiology Reports Ct Angio Head W/cm &/or Wo Cm  03/22/2016  CLINICAL DATA:  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 Now with multiple basal ganglia infarcts. EXAM: CT ANGIOGRAPHY HEAD AND NECK TECHNIQUE: Multidetector CT imaging of the head and neck was performed  using the standard protocol during bolus administration of intravenous contrast. Multiplanar CT image reconstructions and MIPs were obtained to evaluate the vascular anatomy. Carotid stenosis measurements (when applicable) are obtained utilizing NASCET criteria, using the distal internal carotid diameter as the denominator. CONTRAST:  50 mL Isovue 370 IV COMPARISON:  MRI 03/21/2016 FINDINGS: CT HEAD Brain: Generalized atrophy. Multiple areas of enhancement in the basal ganglia bilaterally best seen on MRI. Patchy low-density areas are noted in the basal ganglia bilaterally which may be due to infarction. No acute hemorrhage or mass lesion. No shift of the midline structures. Calvarium and skull base: Negative Paranasal sinuses: Mucosal edema left maxillary sinus. Mild mucosal edema sphenoid sinus. Orbits: Negative CTA NECK Aortic arch: Normal aortic arch. Proximal great vessels widely patent. Bovine branching pattern. Right carotid system: Right common carotid artery widely patent. Right carotid bifurcation widely patent Left carotid system: Left common carotid artery widely patent. Left carotid bifurcation widely patent. Vertebral arteries:Both vertebral arteries widely patent without significant stenosis. Skeleton: Negative Other neck: Negative CTA HEAD Anterior circulation: Cavernous carotid is patent bilaterally. Anterior and middle cerebral arteries are patent bilaterally. No occluded segments. The A1 and M1 segments are smaller in caliber wall caliber than expected. This could be due to basilar meningitis or spasm. MCA branches appear patent bilateral without significant irregularity to suggest vasculitis. Posterior circulation: Both vertebral arteries patent to the basilar. PICA patent bilaterally. Basilar widely patent. Superior cerebellar and posterior cerebral arteries patent bilaterally without stenosis. Venous sinuses: Negative Anatomic variants: Negative Delayed phase: Subtle areas of enhancement in the  left basal ganglia as noted on MRI. No definite abnormal enhancement in the right basal ganglia. Small area of cortical enhancement in the right parietal cortex. These areas are most consistent with subacute infarction. IMPRESSION: Circle of Willis patent without focal stenosis or branch occlusion. Anterior and middle cerebral arteries show diffuse narrowing which may be due to basilar meningitis or vasospasm. This is a subjective findings and is diffuse. Negative CTA neck Subtle enhancement in the left basal ganglia and right parietal cortex corresponding to areas of abnormal enhancement on the MRI. These are most likely due to enhancing subacute infarction related to recent meningococcal meningitis causing occlusion  of arterial perforators. Electronically Signed   By: Marlan Palau M.D.   On: 03/22/2016 12:24   Dg Chest 1 View  03/05/2016  CLINICAL DATA:  Hypoxia. EXAM: CHEST 1 VIEW COMPARISON:  March 03, 2016. FINDINGS: Stable cardiomediastinal silhouette. Tracheostomy and nasogastric tubes are unchanged in position. Right-sided PICC line is stable. No pneumothorax is noted. Improved bibasilar opacities are noted consistent with improving pulmonary edema. Bony thorax is unremarkable. IMPRESSION: Stable support apparatus. Improved bibasilar opacities consistent with improving edema. Electronically Signed   By: Lupita Raider, M.D.   On: 03/05/2016 11:58   Ct Head Wo Contrast  03/20/2016  CLINICAL DATA:  Patient status post witnessed fall. Abrasion to the left periorbital soft tissues. EXAM: CT HEAD WITHOUT CONTRAST CT CERVICAL SPINE WITHOUT CONTRAST TECHNIQUE: Multidetector CT imaging of the head and cervical spine was performed following the standard protocol without intravenous contrast. Multiplanar CT image reconstructions of the cervical spine were also generated. COMPARISON:  Brain CT 02/17/2016; 02/16/2016. FINDINGS: CT HEAD FINDINGS Ventricles and sulci are appropriate for patient's age. Interval  development of left-greater-than-right basal ganglia hypodensities. No evidence for acute intracranial hemorrhage, mass lesion or mass-effect. Orbits are unremarkable. Paranasal sinuses are well aerated. Mucosal thickening left maxillary sinus. Mastoid air cells are unremarkable. Calvarium is intact. CT CERVICAL SPINE FINDINGS Normal anatomic alignment. No evidence for acute cervical spine fracture. Craniocervical junction is intact. Preservation of the vertebral body and intervertebral disc space heights. 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 Lung apices are clear. Thyroid is unremarkable. Prevertebral soft tissues unremarkable. IMPRESSION: Interval development of hypodensities within the left-greater-than-right basal ganglia which may represent subacute infarcts. No acute cervical spine fracture. Electronically Signed   By: Annia Belt M.D.   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 Now with multiple basal ganglia infarcts. EXAM: CT ANGIOGRAPHY HEAD AND NECK TECHNIQUE: Multidetector CT imaging of the head and neck was performed using the standard protocol during bolus administration of intravenous contrast. Multiplanar CT image reconstructions and MIPs were obtained to evaluate the vascular anatomy. Carotid stenosis measurements (when applicable) are obtained utilizing NASCET criteria, using the distal internal carotid diameter as the denominator. CONTRAST:  50 mL Isovue 370 IV COMPARISON:  MRI 03/21/2016 FINDINGS: CT HEAD Brain: Generalized atrophy. Multiple areas of enhancement in the basal ganglia bilaterally best seen on MRI. Patchy low-density areas are noted in the basal ganglia bilaterally which may be due to infarction. No acute hemorrhage or mass lesion. No shift of the midline structures. Calvarium and skull base: Negative Paranasal sinuses: Mucosal edema left maxillary sinus. Mild mucosal edema sphenoid sinus. Orbits:  Negative CTA NECK Aortic arch: Normal aortic arch. Proximal great vessels widely patent. Bovine branching pattern. Right carotid system: Right common carotid artery widely patent. Right carotid bifurcation widely patent Left carotid system: Left common carotid artery widely patent. Left carotid bifurcation widely patent. Vertebral arteries:Both vertebral arteries widely patent without significant stenosis. Skeleton: Negative Other neck: Negative CTA HEAD Anterior circulation: Cavernous carotid is patent bilaterally. Anterior and middle cerebral arteries are patent bilaterally. No occluded segments. The A1 and M1 segments are smaller in caliber wall caliber than expected. This could be due to basilar meningitis or spasm. MCA branches appear patent bilateral without significant irregularity to suggest vasculitis. Posterior circulation: Both vertebral arteries patent to the basilar. PICA patent bilaterally. Basilar widely patent. Superior cerebellar and posterior cerebral arteries patent bilaterally without stenosis. Venous sinuses: Negative Anatomic  variants: Negative Delayed phase: Subtle areas of enhancement in the left basal ganglia as noted on MRI. No definite abnormal enhancement in the right basal ganglia. Small area of cortical enhancement in the right parietal cortex. These areas are most consistent with subacute infarction. IMPRESSION: Circle of Willis patent without focal stenosis or branch occlusion. Anterior and middle cerebral arteries show diffuse narrowing which may be due to basilar meningitis or vasospasm. This is a subjective findings and is diffuse. Negative CTA neck Subtle enhancement in the left basal ganglia and right parietal cortex corresponding to areas of abnormal enhancement on the MRI. 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 Electronically Signed   By: Marlan Palau M.D.   On:  03/22/2016 12:24   Ct Cervical Spine Wo Contrast  03/20/2016  CLINICAL DATA:  Patient status post witnessed fall. Abrasion to the left periorbital soft tissues. EXAM: CT HEAD WITHOUT CONTRAST CT CERVICAL SPINE WITHOUT CONTRAST TECHNIQUE: Multidetector CT imaging of the head and cervical spine was performed following the standard protocol without intravenous contrast. Multiplanar CT image reconstructions of the cervical spine were also generated. COMPARISON:  Brain CT 02/17/2016; 02/16/2016. FINDINGS: CT HEAD FINDINGS Ventricles and sulci are appropriate for patient's age. Interval development of left-greater-than-right basal ganglia hypodensities. No evidence for acute intracranial hemorrhage, mass lesion or mass-effect. Orbits are unremarkable. Paranasal sinuses are well aerated. Mucosal thickening left maxillary sinus. Mastoid air cells are unremarkable. Calvarium is intact. CT CERVICAL SPINE FINDINGS Normal anatomic alignment. No evidence for acute cervical spine fracture. Craniocervical junction is intact. Preservation of the vertebral body and intervertebral disc space heights. 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 Lung apices are clear. Thyroid is unremarkable. Prevertebral soft tissues unremarkable. IMPRESSION: Interval development of hypodensities within the left-greater-than-right basal ganglia which may represent subacute infarcts. No acute cervical spine fracture. Electronically Signed   By: Annia Belt M.D.   On: 03/20/2016 19:15   Mr Brain Wo Contrast  03/24/2016  CLINICAL DATA:  History of streptococcal pneumonia 02/16/2016. Stroke. EXAM: MRI HEAD WITHOUT CONTRAST TECHNIQUE: Multiplanar, multiecho pulse sequences of the brain and surrounding structures were obtained without intravenous contrast. COMPARISON:  CT 03/22/2016, MRI 03/21/2016 FINDINGS: New area of acute infarct in the left corona radiata measuring 10 x 15 mm with restricted diffusion. Patchy areas of increased signal in the  basal ganglia left greater than right on diffusion-weighted imaging show mild interval improvement. Restricted diffusion in the right parietal cortex also shows improvement since the prior MRI. These areas in the basal ganglia showed peripheral enhancement on the prior study. Contrast was not administered today. Small amount of hemorrhage in the left external capsule. Small amount of blood in the left frontal lobe. Small amount of blood products in the occipital horns bilaterally. These findings were present on the prior MRI of 03/21/2016 however were not well visualized or appreciated due to motion. The these areas are not identified is hyperdense on CT. Ventricle size normal. No shift of the midline structures. No fluid collection. Minimal mucosal edema in the paranasal sinuses. Bilateral mastoid sinus effusion. Normal orbit. Normal pituitary. Normal skullbase. IMPRESSION: New area of acute infarct in the left corona radiata. Multiple areas of restricted diffusion in the basal ganglia left greater than right show mild interval improvement since the prior MRI suggesting late subacute infarct. Improvement in restricted diffusion in the right parietal cortex. Small amount of hemorrhage in the left external capsule. Mild amount of intraventricular  hemorrhage. These are unchanged from the MRI of 03/21/2016 although were not appreciated on the prior study due to motion. These results were called by telephone at the time of interpretation on 03/24/2016 at 1:36 pm to [facility] PA , who verbally acknowledged these results. Electronically Signed   By: Marlan Palau M.D.   On: 03/24/2016 13:37   Mr Laqueta Jean ZO Contrast  03/21/2016  CLINICAL DATA:  Found on floor. 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 EXAM: MRI HEAD WITHOUT AND WITH CONTRAST TECHNIQUE: Multiplanar, multiecho pulse sequences of the brain and surrounding structures were obtained without and with intravenous contrast. CONTRAST:  13mL MULTIHANCE  GADOBENATE DIMEGLUMINE 529 MG/ML IV SOLN COMPARISON:  CT head 03/20/2016 FINDINGS: Image quality degraded by motion. Postcontrast images have significant motion. Edema in the basal ganglia bilaterally left greater than right as noted on CT. There is enhancement in the basal ganglia bilaterally. There is abnormal enhancement in the external capsule on the left, rim enhancing enhancement in the left internal capsule and medial basal ganglia. Abnormal enhancement in the left caudate. There is enhancement in the right caudate and right putamen. Many of these areas are within vascular distribution suggesting ischemia. Small area of enhancement in the right parietal cortex could also be an area of subacute infarct. Diffusion-weighted imaging shows restricted diffusion in the left putamen and caudate and possibly in the external capsule on the left. These are most likely areas of subacute infarct. Restricted diffusion also noted in the right parietal cortex where there is abnormal enhancement. Right basal ganglia does not show significant restricted diffusion but does show T2 shine through. Ventricle size normal. No shift of the midline structures. Negative for intracranial hemorrhage. IMPRESSION: Image quality degraded by significant motion. There is extensive abnormal enhancement in the basal ganglia bilaterally. Some of these areas show restricted diffusion left basal ganglia. The enhancement pattern and vascular territory suggest the enhancement is related to subacute infarct related to meningitis 1 month ago. Similar area of enhancement and restricted diffusion in the right parietal cortex. No intracranial hemorrhage. Electronically Signed   By: Marlan Palau M.D.   On: 03/21/2016 19:06   Dg Chest Portable 1 View  03/20/2016  CLINICAL DATA:  Recent fall with altered mental status, initial encounter EXAM: PORTABLE CHEST 1 VIEW COMPARISON:  03/05/2016 FINDINGS: The heart size and mediastinal contours are within  normal limits. Both lungs are clear. The visualized skeletal structures are unremarkable. IMPRESSION: No active disease. Electronically Signed   By: Alcide Clever M.D.   On: 03/20/2016 18:16   Dg Chest Port 1 View  03/03/2016  CLINICAL DATA:  Shortness of breath. EXAM: PORTABLE CHEST 1 VIEW COMPARISON:  02/29/2016. FINDINGS: Tracheostomy tube, NG tube, right PICC line in stable position. Mediastinum hilar structures normal. Heart size stable. Diffuse bilateral airspace disease. Basilar atelectasis. Small bilateral pleural effusions. IMPRESSION: 1. Lines and tubes in stable position. 2. Bilateral airspace disease consistent with bilateral pulmonary infiltrates/edema. Low lung volumes with basilar atelectasis. Small bilateral pleural effusions. Similar findings noted on prior exam. Electronically Signed   By: Maisie Fus  Register   On: 03/03/2016 07:14   Dg Abd Portable 1v  03/29/2016  CLINICAL DATA:  Encounter for feeding tube placement. EXAM: PORTABLE ABDOMEN - 1 VIEW COMPARISON:  Apr 24, 2016. FINDINGS: The bowel gas pattern is normal. Distal tip of feeding tube is in expected position of proximal duodenum. IMPRESSION: Distal tip of feeding tube seen in expected position of proximal duodenum. No evidence of bowel obstruction or  ileus. Electronically Signed   By: Lupita Raider, M.D.   On: 03/29/2016 12:44   Dg Abd Portable 1v  03/25/2016  CLINICAL DATA:  50 year old male with a history of nasogastric tube. New placement after displacement. EXAM: PORTABLE ABDOMEN - 1 VIEW COMPARISON:  03/23/2016 FINDINGS: Metallic tip enteric feeding tube projects over the mediastinum, coiled within the stomach lumen, directed toward the cardia of the stomach. Unremarkable appearance of lung bases. Unremarkable bowel gas pattern. IMPRESSION: Plain film of the upper abdomen demonstrates weighted metallic tip feeding tube terminating within the stomach, with the tip terminating towards the cardia. Signed, Yvone Neu. Loreta Ave, DO  [facility] Electronically Signed   By: Gilmer Mor D.O.   On: 03/25/2016 11:44   Dg Abd Portable 1v  03/23/2016  CLINICAL DATA:  Encounter for NG tube placement. EXAM: PORTABLE ABDOMEN - 1 VIEW COMPARISON:  None. FINDINGS: Tip of the weighted enteric tube in the central abdomen is likely post pyloric in the region of the third portion of the duodenum. There is a normal bowel gas pattern. Enteric contrast is seen in the last and rectosigmoid colon likely from prior modified barium swallow. IMPRESSION: Tip of the weighted enteric tube is post pyloric in the region of the third portion of the duodenum. Electronically Signed   By: Rubye Oaks M.D.   On: 03/23/2016 19:53   Dg Swallowing Func-speech Pathology  03/31/2016  Objective Swallowing Evaluation: Type of Study: MBS-Modified Barium Swallow Study Patient Details Name: ROSWELL NDIAYE MRN: [id number] Date of Birth: 03-05-1966 Today's Date: 03/31/2016 Time: SLP Start Time (ACUTE ONLY): 0929-SLP Stop Time (ACUTE ONLY): 0946 SLP Time Calculation (min) (ACUTE ONLY): 17 min Past Medical History: Past Medical History Diagnosis Date . Depression  . Cardiac arrest (HCC)  . Cardiogenic shock (HCC)  . Acute respiratory failure with hypoxia (HCC)  . Meningitis, streptococcal 02/16/2016 . Trichomonal urethritis in male 02/16/2016 . Tobacco abuse 02/16/2016 . Substance abuse  . ETOH abuse  . Septic shock [facility])  Past Surgical History: Past Surgical History Procedure Laterality Date . Ankle surgery   . Cardiac catheterization N/A 02/17/2016   Procedure: IABP Insertion;  Surgeon: Iran Ouch, MD;  Location: [facility] INVASIVE CV LAB;  Service: Cardiovascular;  Laterality: N/A; . Tee without cardioversion N/A 03/29/2016   Procedure: TRANSESOPHAGEAL ECHOCARDIOGRAM (TEE);  Surgeon: Laurey Morale, MD;  Location: [facility] ENDOSCOPY;  Service: Cardiovascular;  Laterality: N/A; HPI: 50 y.o. male with h/o depression recently in  hospital from 3/4-4/6 after for meningitis.  During that hospital admit, pt suffered meningitis, suffered cardiac arrest, cardiogenic shock with acute systolic CHF with trach placed 3/24 and pt was found to have dysphagia.  Pt returned 4/16 with hypothermia- severe sepsis.  Left gaze preference, basal ganglia abnormality, left corona radiata cva per notes.  Pt removed his tube for feeding last pm and swallow eval ordered.  MBS indicated due to h/o dysphagia and possible worsening with acute issues.   Subjective: pt awake in chair Assessment / Plan / Recommendation CHL IP CLINICAL IMPRESSIONS 03/31/2016 Therapy Diagnosis Moderate oropharyngeal dysphagia Clinical Impression Pt exhibited mild oropharyngeal dysphagia with sensorimotor deficits.  Weak lingual manipulation resulted in decreased bolus cohesion, premature spillage and decreased propulsion.  Pt required multilpe boluses of pudding to transit barium tablet - recommend crushed medications if not contranindicated.  Delayed swallow initiation to the vallecula and pyriform sinuses observed due to decreased sensation.  Due to pt's decreased mental status, delayed swallow response, weak  cough and oral weakness- recommend Dysphagia 1 textures nectar thick liquid and meds crushed in puree.  Pt educated re: diet recommendation.  SLP will f/u to determine diet tolerance and advise dietary advancement.   Impact on safety and function --   CHL IP TREATMENT RECOMMENDATION 03/31/2016 Treatment Recommendations Therapy as outlined in treatment plan below   Prognosis 03/31/2016 Prognosis for Safe Diet Advancement Good Barriers to Reach Goals Motivation Barriers/Prognosis Comment -- CHL IP DIET RECOMMENDATION 03/31/2016 SLP Diet Recommendations Dysphagia 1 (Puree) solids;Nectar thick liquid Liquid Administration via Cup;Straw Medication Administration Crushed with puree Compensations Slow rate;Small sips/bites Postural Changes --   CHL IP OTHER RECOMMENDATIONS 03/07/2016 Recommended  Consults -- Oral Care Recommendations Oral care BID Other Recommendations Order thickener from pharmacy;Prohibited food (jello, ice cream, thin soups);Remove water pitcher;Have oral suction available;Place PMSV during PO intake   CHL IP FOLLOW UP RECOMMENDATIONS 03/09/2016 Follow up Recommendations [facility] FREQUENC[facility] DURATION 03/31/2016 Speech Therapy Frequency (ACUTE ONLY) min 2x/week Treatment Duration 1 week      CHL IP ORAL PHASE 03/31/2016 Oral Phase -- Oral - Pudding Teaspoon -- Oral - Pudding Cup -- Oral - Honey Teaspoon -- Oral - Honey Cup -- Oral - Nectar Teaspoon Weak lingual manipulation;Reduced posterior propulsion Oral - Nectar Cup Weak lingual manipulation;Decreased bolus cohesion;Reduced posterior propulsion;Premature spillage Oral - Nectar Straw Weak lingual manipulation;Decreased bolus cohesion;Reduced posterior propulsion;Premature spillage Oral - Thin Teaspoon Weak lingual manipulation;Decreased bolus cohesion;Reduced posterior propulsion;Premature spillage Oral - Thin Cup Weak lingual manipulation;Decreased bolus cohesion;Reduced posterior propulsion;Premature spillage Oral - Thin Straw Weak lingual manipulation;Decreased bolus cohesion;Reduced posterior propulsion;Premature spillage Oral - Puree Weak lingual manipulation;Decreased bolus cohesion;Reduced posterior propulsion;Premature spillage Oral - Mech Soft Weak lingual manipulation;Decreased bolus cohesion;Reduced posterior propulsion;Premature spillage Oral - Regular -- Oral - Multi-Consistency -- Oral - Pill Weak lingual manipulation;Decreased bolus cohesion;Reduced posterior propulsion;Other (Comment);Premature spillage Oral Phase - Comment --  CHL IP PHARYNGEAL PHASE 03/31/2016 Pharyngeal Phase -- Pharyngeal- Pudding Teaspoon -- Pharyngeal -- Pharyngeal- Pudding Cup -- Pharyngeal -- Pharyngeal- Honey Teaspoon -- Pharyngeal -- Pharyngeal- Honey Cup -- Pharyngeal -- Pharyngeal- Nectar Teaspoon Delayed swallow  initiation-pyriform sinuses Pharyngeal -- Pharyngeal- Nectar Cup Delayed swallow initiation-pyriform sinuses Pharyngeal -- Pharyngeal- Nectar Straw Delayed swallow initiation-pyriform sinuses Pharyngeal -- Pharyngeal- Thin Teaspoon Delayed swallow initiation-pyriform sinuses Pharyngeal -- Pharyngeal- Thin Cup Delayed swallow initiation-pyriform sinuses Pharyngeal -- Pharyngeal- Thin Straw Delayed swallow initiation-pyriform sinuses Pharyngeal -- Pharyngeal- Puree Delayed swallow initiation-vallecula Pharyngeal -- Pharyngeal- Mechanical Soft Delayed swallow initiation-vallecula Pharyngeal -- Pharyngeal- Regular -- Pharyngeal -- Pharyngeal- Multi-consistency -- Pharyngeal -- Pharyngeal- Pill Delayed swallow initiation-vallecula Pharyngeal -- Pharyngeal Comment --    Donavan Burnetamara Kimball, MS [facility] SLP [phone]           Dg Swallowing Func-speech Pathology  03/07/2016  Objective Swallowing Evaluation: Type of Study: MBS-Modified Barium Swallow Study Patient Details Name: Bonnita HollowCraig D Hennon MRN: [id number] Date of Birth: 01/10/1966 Today's Date: 03/07/2016 Time: SLP Start Time (ACUTE ONLY): 0928-SLP Stop Time (ACUTE ONLY): 14780952 SLP Time Calculation (min) (ACUTE ONLY): 24 min Past Medical History: Past Medical History Diagnosis Date . Depression  Past Surgical History: Past Surgical History Procedure Laterality Date . Ankle surgery   . Cardiac catheterization N/A 02/17/2016   Procedure: IABP Insertion;  Surgeon: Iran OuchMuhammad A Arida, MD;  Location: [facility] INVASIVE CV LAB;  Service: Cardiovascular;  Laterality: N/A; HPI: 50 y.o. male with h/o depression [facility] and was d/c with Tamiflu. Family brought pt to  ED 3/14 with leukocytosis, fever of 103, decreased consciousness and minimally responsive. 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 Pt intubated in ED, trach placed 3/24 due to inability to wean off vent. No Data Recorded Assessment /  Plan / Recommendation CHL IP CLINICAL IMPRESSIONS 03/07/2016 Therapy Diagnosis Mild oral phase dysphagia;Mild pharyngeal phase dysphagia Clinical Impression MBS complete with PMSV donned throughout assessment. Pt exhibited mild oropharyngeal dysphagia during this study due to sensory deficits. Weak lingual manipulation resulted in decreased bolus cohesion. Delayed swallow initiation to the vallecula and pyriform sinuses observed, resulting in silent penetration above the vocal cords x1 with intake of thin liquids. No compensatory strategies attempted due to altered mental status and inability to comply. No airway compromise noted for all other consistencies and no pharyngeal residue observed. Due to pt's decreased mental status, recommend Dysphagia 1 (puree) textures to prevent pocketing with nectar thick liquids (no straws) and meds crushed in puree, with PMSV donned. Pt and wife educated re: diet recommendation and PMSV use/precautions. SLP will f/u to determine diet tolerance and advise diet tolerance. Impact on safety and function (No Data)   CHL IP TREATMENT RECOMMENDATION 03/07/2016 Treatment Recommendations Therapy as outlined in treatment plan below   Prognosis 03/07/2016 Prognosis for Safe Diet Advancement Good Barriers to Reach Goals Severity of deficits;Time post onset Barriers/Prognosis Comment -- CHL IP DIET RECOMMENDATION 03/07/2016 SLP Diet Recommendations Dysphagia 1 (Puree) solids;Nectar thick liquid Liquid Administration via Cup;No straw Medication Administration Crushed with puree Compensations Minimize environmental distractions;Slow rate;Small sips/bites Postural Changes Seated upright at 90 degrees   CHL IP OTHER RECOMMENDATIONS 03/07/2016 Recommended Consults -- Oral Care Recommendations Oral care BID Other Recommendations Order thickener from pharmacy;Prohibited food (jello, ice cream, thin soups);Remove water pitcher;Have oral suction available;Place PMSV during PO intake   CHL IP FOLLOW UP  RECOMMENDATIONS 03/07/2016 Follow up Recommendations (No Data)   CHL IP FREQUENCY AND DURATION 03/07/2016 Speech Therapy Frequency (ACUTE ONLY) min 2x/week Treatment Duration 2 weeks      CHL IP ORAL PHASE 03/07/2016 Oral Phase Impaired Oral - Pudding Teaspoon -- Oral - Pudding Cup -- Oral - Honey Teaspoon -- Oral - Honey Cup -- Oral - Nectar Teaspoon -- Oral - Nectar Cup Decreased bolus cohesion;Weak lingual manipulation Oral - Nectar Straw (No Data) Oral - Thin Teaspoon -- Oral - Thin Cup Decreased bolus cohesion;Weak lingual manipulation Oral - Thin Straw -- Oral - Puree Decreased bolus cohesion;Weak lingual manipulation Oral - Mech Soft -- Oral - Regular -- Oral - Multi-Consistency -- Oral - Pill -- Oral Phase - Comment --  CHL IP PHARYNGEAL PHASE 03/07/2016 Pharyngeal Phase Impaired Pharyngeal- Pudding Teaspoon -- Pharyngeal -- Pharyngeal- Pudding Cup -- Pharyngeal -- Pharyngeal- Honey Teaspoon -- Pharyngeal -- Pharyngeal- Honey Cup -- Pharyngeal -- Pharyngeal- Nectar Teaspoon -- Pharyngeal -- Pharyngeal- Nectar Cup Delayed swallow initiation-pyriform sinuses;Penetration/Aspiration during swallow Pharyngeal Material enters airway, remains ABOVE vocal cords and not ejected out Pharyngeal- Nectar Straw (No Data) Pharyngeal -- Pharyngeal- Thin Teaspoon -- Pharyngeal -- Pharyngeal- Thin Cup Penetration/Aspiration during swallow;Delayed swallow initiation-pyriform sinuses Pharyngeal Material enters airway, remains ABOVE vocal cords and not ejected out Pharyngeal- Thin Straw -- Pharyngeal -- Pharyngeal- Puree Delayed swallow initiation-vallecula Pharyngeal -- Pharyngeal- Mechanical Soft -- Pharyngeal -- Pharyngeal- Regular -- Pharyngeal -- Pharyngeal- Multi-consistency -- Pharyngeal -- Pharyngeal- Pill -- Pharyngeal -- Pharyngeal Comment --  CHL IP CERVICAL ESOPHAGEAL PHASE 03/07/2016 Cervical Esophageal Phase WFL Pudding Teaspoon -- Pudding Cup -- Honey Teaspoon -- Honey Cup -- [employer]  Teaspoon -- Nectar Cup -- Nectar Straw --  Thin Teaspoon -- Thin Cup -- Thin Straw -- Puree -- Mechanical Soft -- Regular -- Multi-consistency -- Pill -- Cervical Esophageal Comment -- No flowsheet data found. Royce Macadamia 03/07/2016, 11:17 AM Breck Coons Lonell Face.Ed CCC-SLP Pager [phone]               Time Spent in minutes  30   Rewa Weissberg K M.D on 04/01/2016 at 1:13 PM  Between 7am to 7pm - Pager - [phone]  After 7pm go to [email or web address] - password [facility]  [facility] -  Office  [phone]

## 2016-04-01 NOTE — Progress Notes (Signed)
Nutrition Follow-up  DOCUMENTATION CODES:   Non-severe (moderate) malnutrition in context of chronic illness  INTERVENTION:  Provide Ensure Enlive po BID, each supplement provides 350 kcal and 20 grams of protein Magic Cup with meal trays, each cup provides 290 kcal and 9 grams of protein   NUTRITION DIAGNOSIS:   Malnutrition related to chronic illness as evidenced by mild depletion of body fat, moderate depletion of body fat, mild depletion of muscle mass, moderate depletions of muscle mass. Ongoing  GOAL:   Patient will meet greater than or equal to 90% of their needs  Unmet  MONITOR:   TF tolerance, Labs, Weight trends, I & O's, Skin  REASON FOR ASSESSMENT:   Consult Enteral/tube feeding initiation and management  ASSESSMENT:   50 yo M with hx homelessness, admitted 3-14 to 03-10-16 with streptococcal (CSF Pneumococcus R-PEN) meningitis. His course was complicated by v-fib and respiratory arrest, as well as ARF due to ATN. D/C'ed to SNF. on 4-16 after being found down. He was hypothermic. His CT head suggests subacute infarcts of basal ganglia. MRI also showed these and suggested them as residual from his previous meningitis.  Pt's NG tube was pulled NGT on evening of 4/26 and diet was advanced to Dysphagia 1 with Nectar-thick liquids on 4/27. Per RN, pt ate well at breakfast this morning. Pt was asleep at time of visit. His weight has trended back down 7 lbs in the past 2 days. Doubt that patient will meet nutrition needs on pureed foods; will provide nutritional supplements.   Labs: low hemoglobin, low calcium  Diet Order:  DIET - DYS 1 Room service appropriate?: Yes; Fluid consistency:: Nectar Thick  Skin:  Wound (see comment) (skin tear on buttocks)  Last BM:  4/25  Height:   Ht Readings from Last 1 Encounters:  03/20/16 5\' 8"  (1.727 m)    Weight:   Wt Readings from Last 1 Encounters:  04/01/16 132 lb 11.5 oz (60.2 kg)    Ideal Body Weight:  72.7  kg  BMI:  Body mass index is 20.18 kg/(m^2).  Estimated Nutritional Needs:   Kcal:  1850-2100  Protein:  100-115 gm  Fluid:  >/= 2 L  EDUCATION NEEDS:   No education needs identified at this time  Dorothea Ogleeanne Sandy Blouch RD, LDN Inpatient Clinical Dietitian Pager: [phone] After Hours Pager: [phone]

## 2016-04-02 LAB — GLUCOSE, CAPILLARY
GLUCOSE-CAPILLARY: 62 mg/dL — AB (ref 65–99)
GLUCOSE-CAPILLARY: 81 mg/dL (ref 65–99)
Glucose-Capillary: 112 mg/dL — ABNORMAL HIGH (ref 65–99)
Glucose-Capillary: 67 mg/dL (ref 65–99)
Glucose-Capillary: 69 mg/dL (ref 65–99)
Glucose-Capillary: 77 mg/dL (ref 65–99)
Glucose-Capillary: 78 mg/dL (ref 65–99)

## 2016-04-02 LAB — BASIC METABOLIC PANEL
Anion gap: 9 (ref 5–15)
BUN: 22 mg/dL — AB (ref 6–20)
CHLORIDE: 106 mmol/L (ref 101–111)
CO2: 28 mmol/L (ref 22–32)
CREATININE: 1.06 mg/dL (ref 0.61–1.24)
Calcium: 8.3 mg/dL — ABNORMAL LOW (ref 8.9–10.3)
GFR calc Af Amer: >60 mL/min (ref >60)
GLUCOSE: 106 mg/dL — AB (ref 65–99)
POTASSIUM: 4.1 mmol/L (ref 3.5–5.1)
SODIUM: 143 mmol/L (ref 135–145)

## 2016-04-02 LAB — CBC
HCT: 28.7 % — ABNORMAL LOW (ref 39.0–52.0)
Hemoglobin: 9.7 g/dL — ABNORMAL LOW (ref 13.0–17.0)
MCH: 29.8 pg (ref 26.0–34.0)
MCHC: 33.8 g/dL (ref 30.0–36.0)
MCV: 88.3 fL (ref 78.0–100.0)
Platelets: 208 K/uL (ref 150–400)
RBC: 3.25 MIL/uL — ABNORMAL LOW (ref 4.22–5.81)
RDW: 14.1 % (ref 11.5–15.5)
WBC: 13.4 K/uL — ABNORMAL HIGH (ref 4.0–10.5)

## 2016-04-02 MED ORDER — PRO-STAT SUGAR FREE PO LIQD
30.0000 mL | Freq: Three times a day (TID) | ORAL | Status: DC
  Administered 2016-04-02 – 2016-04-05 (×9): 30 mL via ORAL
  Filled 2016-04-02 (×10): qty 30

## 2016-04-02 MED ORDER — PREDNISONE 20 MG PO TABS
40.0000 mg | ORAL_TABLET | Freq: Every day | ORAL | Status: DC
  Administered 2016-04-02: 40 mg via ORAL
  Filled 2016-04-02: qty 2

## 2016-04-02 MED ORDER — ENSURE ENLIVE PO LIQD
237.0000 mL | Freq: Four times a day (QID) | ORAL | Status: DC
Start: 2016-04-02 — End: 2016-04-06
  Administered 2016-04-02 – 2016-04-05 (×12): 237 mL via ORAL
  Filled 2016-04-02 (×17): qty 237

## 2016-04-02 MED ORDER — PREDNISONE 20 MG PO TABS
20.0000 mg | ORAL_TABLET | Freq: Every day | ORAL | Status: DC
  Administered 2016-04-03: 20 mg via ORAL
  Filled 2016-04-02: qty 1

## 2016-04-02 NOTE — Progress Notes (Signed)
SLP Cancellation Note  Patient Details Name: Dale HollowCraig D Cooley MRN: [id number] DOB: 07/10/1966   Cancelled treatment:       Reason Eval/Treat Not Completed: Fatigue/lethargy limiting ability to participate (nurse tech reports pt agitated in am, has sitter and is currently asleep, sitter reports pt ate all of his meal without difficulties)   Donavan Burnetamara Parley Pidcock, MS [facility] SLP [phone]

## 2016-04-02 NOTE — Progress Notes (Signed)
PROGRESS NOTE                                                                                                                                                                                                             Patient Demographics:    Dale [name], is a 50 y.o. male, DOB - 10-13-1966, WUJ:[id number]  Admit date - 03/20/2016   Admitting Physician Orville Govern, MD  Outpatient Primary MD for the patient is No primary care provider on file.  LOS - 13  Outpatient Specialists:   Chief Complaint  Patient presents with  . Fall       Brief Narrative  50 yo M with hx homelessness, admitted 3-14 to 03-10-16 with streptococcal (CSF Pneumococcus R-PEN) meningitis. His course was complicated by v-fib and respiratory arrest, as well as ARF due to ATN. He received 14 days of ceftriaxone, and weekly IM Pen G (for positive RPR 1:2). He was d/c to SNF. Non-verbal. At SNF his mental status was stable.  He returned to the ED on 4-16 after being found down. He was hypothermic. He was admitted to critical care on 4/16 with Severe Sepsis and transferred to hospitalist service on 4/19.  He was started on vanco/ceftriaxone. He had repeat LP showing 400 WBC (75%N), Prot > 600, Glc 37. He had solumedrol added. His CT head suggested subacute infarcts of basal ganglia. MRI also showed these and suggested them as residual from his previous meningitis. (per Dr. Ninetta Lights) TEE no endocarditis, palliative team for goal of care, family meeting pending 4/26, started steroid tapering started.    Subjective:    Dale [name] today has, No headache, No chest pain, No abdominal pain - No Nausea, No new weakness tingling or numbness, No Cough - SOB.    Assessment  & Plan :    1. 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 Patient has streptococcal pneumonia isolated from blood and CSF cultures on 02/16/2016 on a previous admission. Seen by ID. Initially was  treated with vancomycin. Per ID vancomycin was stopped Rocephin continued. PICC line placed. Stop date for IV Rocephin as 04/29/2016. Must follow with ID prior to stopping this medication. He had an unremarkable TEE. Clinically sepsis physiology has resolved.  2. Acute encephalopathy with right-sided hemi-neglect. L coronary at the infarct. Basal ganglia abnormalities noted on MRI. Also just above likely  previous meningitis effect. Neurology saw the patient, they question if patient has cerebral vasculitis due to underlying infections. They have recommended for steroids to be continued along with antibiotics as above. I discussed his case with neurologist Dr. Roda Shutters on 04/01/2016, per neurology rapidly taper of steroids, switched to oral prednisone with taper within 3-4 days.  3. Ur. retention. Placed on Flomax. We'll discontinue Foley catheter on 04/02/2016 which was placed few days ago.  4. Dyslipidemia. On statin.  5. Dysphagia. Speech following. Underwent modified barium swallow. Currently on dysphagia 1 diet with nectar thick fluids.  6. Severe protein calorie malnutrition. On protein supplementation.  7. Hypernatremia and ARF. Due to dehydration. Resolved with IV fluids.  8. 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 Compensated. Currently on beta blocker, aspirin and statin.  9. Mild hyperglycemia. Likely due to poor oral intake, increase nutritional supplementation. Check TSH and morning cortisol, check A1c. Monitor CBGs every 6 hours.  CBG (last 3)   Recent Labs  04/02/16 0411 04/02/16 0443 04/02/16 0832  GLUCAP 62* 78 69      Code Status : Full  Family Communication  : On present  Disposition Plan  : SNF in 1-2 days  Barriers For Discharge : Meningitis  Consults  :     Neurology  ID Palliative care  Procedures  :    4/18 TTE - EF 45-50% - diffuse hypokinesis - grade 1 diastolic dysfunction.  DVT Prophylaxis  :  Lovenox   Lab Results  Component Value  Date   PLT 208 04/02/2016    Antibiotics  :    Anti-infectives    Start     Dose/Rate Route Frequency Ordered Stop   03/31/16 1800  cefTRIAXone (ROCEPHIN) 2 g in dextrose 5 % 50 mL IVPB     2 g 100 mL/hr over 30 Minutes Intravenous Every 12 hours 03/31/16 0739     03/29/16 0900  vancomycin (VANCOCIN) 1,500 mg in sodium chloride 0.9 % 500 mL IVPB  Status:  Discontinued     1,500 mg 250 mL/hr over 120 Minutes Intravenous Every 24 hours 03/28/16 0745 03/28/16 1140   03/28/16 0800  vancomycin (VANCOCIN) IVPB 1000 mg/200 mL premix     1,000 mg 200 mL/hr over 60 Minutes Intravenous  Once 03/28/16 0745 03/28/16 0920   03/28/16 0500  vancomycin (VANCOCIN) 500 mg in sodium chloride 0.9 % 100 mL IVPB  Status:  Discontinued     500 mg 100 mL/hr over 60 Minutes Intravenous Every 12 hours 03/27/16 1642 03/28/16 0745   03/24/16 1600  vancomycin (VANCOCIN) IVPB 1000 mg/200 mL premix  Status:  Discontinued     1,000 mg 200 mL/hr over 60 Minutes Intravenous Every 24 hours 03/24/16 1124 03/27/16 1642   03/21/16 1000  vancomycin (VANCOCIN) 500 mg in sodium chloride 0.9 % 100 mL IVPB  Status:  Discontinued     500 mg 100 mL/hr over 60 Minutes Intravenous Every 12 hours 03/20/16 1940 03/24/16 1113   03/21/16 0800  cefTRIAXone (ROCEPHIN) 2 g in dextrose 5 % 50 mL IVPB  Status:  Discontinued     2 g 100 mL/hr over 30 Minutes Intravenous Every 12 hours 03/21/16 0040 03/31/16 0739   03/20/16 2000  vancomycin (VANCOCIN) IVPB 1000 mg/200 mL premix     1,000 mg 200 mL/hr over 60 Minutes Intravenous NOW 03/20/16 1940 03/20/16 2103   03/20/16 1930  cefTRIAXone (ROCEPHIN) 2 g in dextrose 5 % 50 mL IVPB     2 g  100 mL/hr over 30 Minutes Intravenous  Once 03/20/16 1919 03/20/16 2030        Objective:   Filed Vitals:   04/01/16 1742 04/01/16 2126 04/02/16 0417 04/02/16 1044  BP: 129/90 126/71 146/74 121/72  Pulse: 70 76 75 83  Temp: 97.9 F (36.6 C) 97.9 F (36.6 C) 97.7 F (36.5 C) 97.9 F (36.6 C)    TempSrc: Axillary Axillary Axillary Oral  Resp: 16 20 20 17   Height:      Weight:      SpO2: 100% 100% 100% 100%    Wt Readings from Last 3 Encounters:  04/01/16 60.2 kg (132 lb 11.5 oz)  03/15/16 55.974 kg (123 lb 6.4 oz)  03/14/16 55.906 kg (123 lb 4 oz)     Intake/Output Summary (Last 24 hours) at 04/02/16 1126 Last data filed at 04/02/16 0849  Gross per 24 hour  Intake    293 ml  Output   2250 ml  Net  -1957 ml     Physical Exam  Awake, appears weak ++, Right-sided neglect, No new F.N deficits,   [place].AT,PERRAL Supple Neck,No JVD, No cervical lymphadenopathy appriciated.  Symmetrical Chest wall movement, Good air movement bilaterally, CTAB RRR,No Gallops,Rubs or new Murmurs, No Parasternal Heave +ve B.Sounds, Abd Soft, No tenderness, No organomegaly appriciated, No rebound - guarding or rigidity.   No Cyanosis, Clubbing or edema, No new Rash or bruise      Data Review:    CBC  Recent Labs Lab 03/28/16 0400 03/29/16 0425 03/31/16 0716 04/01/16 0500 04/02/16 0507  WBC 17.5* 15.0* 16.7* 14.0* 13.4*  HGB 10.2* 10.4* 10.4* 10.4* 9.7*  HCT 31.2* 31.2* 30.8* 31.5* 28.7*  PLT 200 181 195 209 208  MCV 87.9 86.0 84.6 86.3 88.3  MCH 28.7 28.7 28.6 28.5 29.8  MCHC 32.7 33.3 33.8 33.0 33.8  RDW 14.1 13.8 13.6 13.7 14.1    Chemistries   Recent Labs Lab 03/28/16 0400 03/29/16 0425 03/31/16 0716 04/01/16 0500 04/01/16 0700 04/02/16 0507  NA 144 142 142 141  --  143  K 3.7 4.6 4.2 4.0  --  4.1  CL 105 101 103 102  --  106  CO2 28 31 28 28   --  28  GLUCOSE 115* 106* 87 102*  --  106*  BUN 26* 24* 27* 26*  --  22*  CREATININE 1.21 1.21 1.07 1.07  --  1.06  CALCIUM 8.1* 8.1* 8.3* 8.5*  --  8.3*  MG  --  2.1  --   --  2.0  --   AST 22  --   --   --   --   --   ALT 29  --   --   --   --   --   ALKPHOS 65  --   --   --   --   --   BILITOT 0.3  --   --   --   --   --     ------------------------------------------------------------------------------------------------------------------ No results for input(s): CHOL, HDL, LDLCALC, TRIG, CHOLHDL, LDLDIRECT in the last 72 hours.  Lab Results  Component Value Date   HGBA1C 5.8* 03/27/2016   ------------------------------------------------------------------------------------------------------------------ No results for input(s): TSH, T4TOTAL, T3FREE, THYROIDAB in the last 72 hours.  Invalid input(s): FREET3 ------------------------------------------------------------------------------------------------------------------ No results for input(s): VITAMINB12, FOLATE, FERRITIN, TIBC, IRON, RETICCTPCT in the last 72 hours.  Coagulation profile No results for input(s): INR, PROTIME in the last 168 hours.  No results for input(s): DDIMER in  the last 72 hours.  Cardiac Enzymes No results for input(s): CKMB, TROPONINI, MYOGLOBIN in the last 168 hours.  Invalid input(s): CK ------------------------------------------------------------------------------------------------------------------    Component Value Date/Time   BNP 23.8 03/20/2016 1800    Inpatient Medications  Scheduled Meds: . aspirin  325 mg Per Tube Daily  . atorvastatin  20 mg Oral q1800  . carvedilol  3.125 mg Per Tube BID WC  . cefTRIAXone (ROCEPHIN)  IV  2 g Intravenous Q12H  . enoxaparin (LOVENOX) injection  40 mg Subcutaneous Q24H  . famotidine  20 mg Per Tube Daily  . feeding supplement (ENSURE ENLIVE)  237 mL Oral Q6H  . folic acid  1 mg Per Tube Daily  . levETIRAcetam  500 mg Per Tube BID  . [START ON 04/03/2016] predniSONE  20 mg Oral Q breakfast  . tamsulosin  0.4 mg Oral Daily  . thiamine  100 mg Per Tube Daily  . valproic acid  250 mg Per Tube Q8H   Continuous Infusions:   PRN Meds:.RESOURCE THICKENUP CLEAR  Micro Results No results found for this or any previous visit (from the past 240 hour(s)).  Radiology Reports Ct  Angio Head W/cm &/or Wo Cm  03/22/2016  CLINICAL DATA:  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 Now with multiple basal ganglia infarcts. EXAM: CT ANGIOGRAPHY HEAD AND NECK TECHNIQUE: Multidetector CT imaging of the head and neck was performed using the standard protocol during bolus administration of intravenous contrast. Multiplanar CT image reconstructions and MIPs were obtained to evaluate the vascular anatomy. Carotid stenosis measurements (when applicable) are obtained utilizing NASCET criteria, using the distal internal carotid diameter as the denominator. CONTRAST:  50 mL Isovue 370 IV COMPARISON:  MRI 03/21/2016 FINDINGS: CT HEAD Brain: Generalized atrophy. Multiple areas of enhancement in the basal ganglia bilaterally best seen on MRI. Patchy low-density areas are noted in the basal ganglia bilaterally which may be due to infarction. No acute hemorrhage or mass lesion. No shift of the midline structures. Calvarium and skull base: Negative Paranasal sinuses: Mucosal edema left maxillary sinus. Mild mucosal edema sphenoid sinus. Orbits: Negative CTA NECK Aortic arch: Normal aortic arch. Proximal great vessels widely patent. Bovine branching pattern. Right carotid system: Right common carotid artery widely patent. Right carotid bifurcation widely patent Left carotid system: Left common carotid artery widely patent. Left carotid bifurcation widely patent. Vertebral arteries:Both vertebral arteries widely patent without significant stenosis. Skeleton: Negative Other neck: Negative CTA HEAD Anterior circulation: Cavernous carotid is patent bilaterally. Anterior and middle cerebral arteries are patent bilaterally. No occluded segments. The A1 and M1 segments are smaller in caliber wall caliber than expected. This could be due to basilar meningitis or spasm. MCA branches appear patent bilateral without significant irregularity to suggest vasculitis. Posterior circulation: Both vertebral arteries patent to the  basilar. PICA patent bilaterally. Basilar widely patent. Superior cerebellar and posterior cerebral arteries patent bilaterally without stenosis. Venous sinuses: Negative Anatomic variants: Negative Delayed phase: Subtle areas of enhancement in the left basal ganglia as noted on MRI. No definite abnormal enhancement in the right basal ganglia. Small area of cortical enhancement in the right parietal cortex. These areas are most consistent with subacute infarction. IMPRESSION: Circle of Willis patent without focal stenosis or branch occlusion. Anterior and middle cerebral arteries show diffuse narrowing which may be due to basilar meningitis or vasospasm. This is a subjective findings and is diffuse. Negative CTA neck Subtle enhancement in the left basal ganglia and right parietal cortex corresponding to areas of abnormal enhancement on the  MRI. 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 Electronically Signed   By: Marlan Palau M.D.   On: 03/22/2016 12:24   Dg Chest 1 View  03/05/2016  CLINICAL DATA:  Hypoxia. EXAM: CHEST 1 VIEW COMPARISON:  March 03, 2016. FINDINGS: Stable cardiomediastinal silhouette. Tracheostomy and nasogastric tubes are unchanged in position. Right-sided PICC line is stable. No pneumothorax is noted. Improved bibasilar opacities are noted consistent with improving pulmonary edema. Bony thorax is unremarkable. IMPRESSION: Stable support apparatus. Improved bibasilar opacities consistent with improving edema. Electronically Signed   By: Lupita Raider, M.D.   On: 03/05/2016 11:58   Ct Head Wo Contrast  03/20/2016  CLINICAL DATA:  Patient status post witnessed fall. Abrasion to the left periorbital soft tissues. EXAM: CT HEAD WITHOUT CONTRAST CT CERVICAL SPINE WITHOUT CONTRAST TECHNIQUE: Multidetector CT imaging of the head and cervical spine was performed following the standard protocol  without intravenous contrast. Multiplanar CT image reconstructions of the cervical spine were also generated. COMPARISON:  Brain CT 02/17/2016; 02/16/2016. FINDINGS: CT HEAD FINDINGS Ventricles and sulci are appropriate for patient's age. Interval development of left-greater-than-right basal ganglia hypodensities. No evidence for acute intracranial hemorrhage, mass lesion or mass-effect. Orbits are unremarkable. Paranasal sinuses are well aerated. Mucosal thickening left maxillary sinus. Mastoid air cells are unremarkable. Calvarium is intact. CT CERVICAL SPINE FINDINGS Normal anatomic alignment. No evidence for acute cervical spine fracture. Craniocervical junction is intact. Preservation of the vertebral body and intervertebral disc space heights. 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 Lung apices are clear. Thyroid is unremarkable. Prevertebral soft tissues unremarkable. IMPRESSION: Interval development of hypodensities within the left-greater-than-right basal ganglia which may represent subacute infarcts. No acute cervical spine fracture. Electronically Signed   By: Annia Belt M.D.   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 Now with multiple basal ganglia infarcts. EXAM: CT ANGIOGRAPHY HEAD AND NECK TECHNIQUE: Multidetector CT imaging of the head and neck was performed using the standard protocol during bolus administration of intravenous contrast. Multiplanar CT image reconstructions and MIPs were obtained to evaluate the vascular anatomy. Carotid stenosis measurements (when applicable) are obtained utilizing NASCET criteria, using the distal internal carotid diameter as the denominator. CONTRAST:  50 mL Isovue 370 IV COMPARISON:  MRI 03/21/2016 FINDINGS: CT HEAD Brain: Generalized atrophy. Multiple areas of enhancement in the basal ganglia bilaterally best seen on MRI. Patchy low-density areas are noted in the basal ganglia  bilaterally which may be due to infarction. No acute hemorrhage or mass lesion. No shift of the midline structures. Calvarium and skull base: Negative Paranasal sinuses: Mucosal edema left maxillary sinus. Mild mucosal edema sphenoid sinus. Orbits: Negative CTA NECK Aortic arch: Normal aortic arch. Proximal great vessels widely patent. Bovine branching pattern. Right carotid system: Right common carotid artery widely patent. Right carotid bifurcation widely patent Left carotid system: Left common carotid artery widely patent. Left carotid bifurcation widely patent. Vertebral arteries:Both vertebral arteries widely patent without significant stenosis. Skeleton: Negative Other neck: Negative CTA HEAD Anterior circulation: Cavernous carotid is patent bilaterally. Anterior and middle cerebral arteries are patent bilaterally. No occluded segments. The A1 and M1 segments are smaller in caliber wall caliber than expected. This could be due to basilar meningitis or spasm. MCA branches appear patent bilateral without significant irregularity to suggest vasculitis. Posterior circulation: Both vertebral arteries patent to the basilar. PICA patent bilaterally.  Basilar widely patent. Superior cerebellar and posterior cerebral arteries patent bilaterally without stenosis. Venous sinuses: Negative Anatomic variants: Negative Delayed phase: Subtle areas of enhancement in the left basal ganglia as noted on MRI. No definite abnormal enhancement in the right basal ganglia. Small area of cortical enhancement in the right parietal cortex. These areas are most consistent with subacute infarction. IMPRESSION: Circle of Willis patent without focal stenosis or branch occlusion. Anterior and middle cerebral arteries show diffuse narrowing which may be due to basilar meningitis or vasospasm. This is a subjective findings and is diffuse. Negative CTA neck Subtle enhancement in the left basal ganglia and right parietal cortex corresponding to  areas of abnormal enhancement on the MRI. 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 Electronically Signed   By: Marlan Palau M.D.   On: 03/22/2016 12:24   Ct Cervical Spine Wo Contrast  03/20/2016  CLINICAL DATA:  Patient status post witnessed fall. Abrasion to the left periorbital soft tissues. EXAM: CT HEAD WITHOUT CONTRAST CT CERVICAL SPINE WITHOUT CONTRAST TECHNIQUE: Multidetector CT imaging of the head and cervical spine was performed following the standard protocol without intravenous contrast. Multiplanar CT image reconstructions of the cervical spine were also generated. COMPARISON:  Brain CT 02/17/2016; 02/16/2016. FINDINGS: CT HEAD FINDINGS Ventricles and sulci are appropriate for patient's age. Interval development of left-greater-than-right basal ganglia hypodensities. No evidence for acute intracranial hemorrhage, mass lesion or mass-effect. Orbits are unremarkable. Paranasal sinuses are well aerated. Mucosal thickening left maxillary sinus. Mastoid air cells are unremarkable. Calvarium is intact. CT CERVICAL SPINE FINDINGS Normal anatomic alignment. No evidence for acute cervical spine fracture. Craniocervical junction is intact. Preservation of the vertebral body and intervertebral disc space heights. 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 Lung apices are clear. Thyroid is unremarkable. Prevertebral soft tissues unremarkable. IMPRESSION: Interval development of hypodensities within the left-greater-than-right basal ganglia which may represent subacute infarcts. No acute cervical spine fracture. Electronically Signed   By: Annia Belt M.D.   On: 03/20/2016 19:15   Mr Brain Wo Contrast  03/24/2016  CLINICAL DATA:  History of streptococcal pneumonia 02/16/2016. Stroke. EXAM: MRI HEAD WITHOUT CONTRAST TECHNIQUE: Multiplanar, multiecho pulse sequences of the brain and surrounding structures  were obtained without intravenous contrast. COMPARISON:  CT 03/22/2016, MRI 03/21/2016 FINDINGS: New area of acute infarct in the left corona radiata measuring 10 x 15 mm with restricted diffusion. Patchy areas of increased signal in the basal ganglia left greater than right on diffusion-weighted imaging show mild interval improvement. Restricted diffusion in the right parietal cortex also shows improvement since the prior MRI. These areas in the basal ganglia showed peripheral enhancement on the prior study. Contrast was not administered today. Small amount of hemorrhage in the left external capsule. Small amount of blood in the left frontal lobe. Small amount of blood products in the occipital horns bilaterally. These findings were present on the prior MRI of 03/21/2016 however were not well visualized or appreciated due to motion. The these areas are not identified is hyperdense on CT. Ventricle size normal. No shift of the midline structures. No fluid collection. Minimal mucosal edema in the paranasal sinuses. Bilateral mastoid sinus effusion. Normal orbit. Normal pituitary. Normal skullbase. IMPRESSION: New area of acute infarct in the left corona radiata. Multiple areas of restricted diffusion in the basal ganglia left greater than right show mild interval improvement since the prior MRI suggesting late subacute infarct. Improvement in restricted diffusion in  the right parietal cortex. Small amount of hemorrhage in the left external capsule. Mild amount of intraventricular hemorrhage. These are unchanged from the MRI of 03/21/2016 although were not appreciated on the prior study due to motion. These results were called by telephone at the time of interpretation on 03/24/2016 at 1:36 pm to [facility] PA , who verbally acknowledged these results. Electronically Signed   By: Marlan Palau M.D.   On: 03/24/2016 13:37   Mr Laqueta Jean BM Contrast  03/21/2016  CLINICAL DATA:  Found on floor. History of meningococcal  meningitis 02/16/2016. EXAM: MRI HEAD WITHOUT AND WITH CONTRAST TECHNIQUE: Multiplanar, multiecho pulse sequences of the brain and surrounding structures were obtained without and with intravenous contrast. CONTRAST:  13mL MULTIHANCE GADOBENATE DIMEGLUMINE 529 MG/ML IV SOLN COMPARISON:  CT head 03/20/2016 FINDINGS: Image quality degraded by motion. Postcontrast images have significant motion. Edema in the basal ganglia bilaterally left greater than right as noted on CT. There is enhancement in the basal ganglia bilaterally. There is abnormal enhancement in the external capsule on the left, rim enhancing enhancement in the left internal capsule and medial basal ganglia. Abnormal enhancement in the left caudate. There is enhancement in the right caudate and right putamen. Many of these areas are within vascular distribution suggesting ischemia. Small area of enhancement in the right parietal cortex could also be an area of subacute infarct. Diffusion-weighted imaging shows restricted diffusion in the left putamen and caudate and possibly in the external capsule on the left. These are most likely areas of subacute infarct. Restricted diffusion also noted in the right parietal cortex where there is abnormal enhancement. Right basal ganglia does not show significant restricted diffusion but does show T2 shine through. Ventricle size normal. No shift of the midline structures. Negative for intracranial hemorrhage. IMPRESSION: Image quality degraded by significant motion. There is extensive abnormal enhancement in the basal ganglia bilaterally. Some of these areas show restricted diffusion left basal ganglia. The enhancement pattern and vascular territory suggest the enhancement is related to subacute infarct related to meningitis 1 month ago. Similar area of enhancement and restricted diffusion in the right parietal cortex. No intracranial hemorrhage. Electronically Signed   By: Marlan Palau M.D.   On: 03/21/2016 19:06     Dg Chest Portable 1 View  03/20/2016  CLINICAL DATA:  Recent fall with altered mental status, initial encounter EXAM: PORTABLE CHEST 1 VIEW COMPARISON:  03/05/2016 FINDINGS: The heart size and mediastinal contours are within normal limits. Both lungs are clear. The visualized skeletal structures are unremarkable. IMPRESSION: No active disease. Electronically Signed   By: Alcide Clever M.D.   On: 03/20/2016 18:16   Dg Abd Portable 1v  03/29/2016  CLINICAL DATA:  Encounter for feeding tube placement. EXAM: PORTABLE ABDOMEN - 1 VIEW COMPARISON:  Apr 24, 2016. FINDINGS: The bowel gas pattern is normal. Distal tip of feeding tube is in expected position of proximal duodenum. IMPRESSION: Distal tip of feeding tube seen in expected position of proximal duodenum. No evidence of bowel obstruction or ileus. Electronically Signed   By: Lupita Raider, M.D.   On: 03/29/2016 12:44   Dg Abd Portable 1v  03/25/2016  CLINICAL DATA:  50 year old male with a history of nasogastric tube. New placement after displacement. EXAM: PORTABLE ABDOMEN - 1 VIEW COMPARISON:  03/23/2016 FINDINGS: Metallic tip enteric feeding tube projects over the mediastinum, coiled within the stomach lumen, directed toward the cardia of the stomach. Unremarkable appearance of lung bases. Unremarkable bowel gas pattern.  IMPRESSION: Plain film of the upper abdomen demonstrates weighted metallic tip feeding tube terminating within the stomach, with the tip terminating towards the cardia. Signed, Yvone Neu. Loreta Ave, DO [facility] Electronically Signed   By: Gilmer Mor D.O.   On: 03/25/2016 11:44   Dg Abd Portable 1v  03/23/2016  CLINICAL DATA:  Encounter for NG tube placement. EXAM: PORTABLE ABDOMEN - 1 VIEW COMPARISON:  None. FINDINGS: Tip of the weighted enteric tube in the central abdomen is likely post pyloric in the region of the third portion of the duodenum. There is a normal bowel  gas pattern. Enteric contrast is seen in the last and rectosigmoid colon likely from prior modified barium swallow. IMPRESSION: Tip of the weighted enteric tube is post pyloric in the region of the third portion of the duodenum. Electronically Signed   By: Rubye Oaks M.D.   On: 03/23/2016 19:53   Dg Swallowing Func-speech Pathology  03/31/2016  Objective Swallowing Evaluation: Type of Study: MBS-Modified Barium Swallow Study Patient Details Name: CHADWIN FURY MRN: [id number] Date of Birth: Jan 10, 1966 Today's Date: 03/31/2016 Time: SLP Start Time (ACUTE ONLY): 0929-SLP Stop Time (ACUTE ONLY): 0946 SLP Time Calculation (min) (ACUTE ONLY): 17 min Past Medical History: Past Medical History Diagnosis Date . Depression  . Cardiac arrest (HCC)  . Cardiogenic shock (HCC)  . Acute respiratory failure with hypoxia (HCC)  . Meningitis, streptococcal 02/16/2016 . Trichomonal urethritis in male 02/16/2016 . Tobacco abuse 02/16/2016 . Substance abuse  . ETOH abuse  . Septic shock [facility])  Past Surgical History: Past Surgical History Procedure Laterality Date . Ankle surgery   . Cardiac catheterization N/A 02/17/2016   Procedure: IABP Insertion;  Surgeon: Iran Ouch, MD;  Location: [facility] INVASIVE CV LAB;  Service: Cardiovascular;  Laterality: N/A; . Tee without cardioversion N/A 03/29/2016   Procedure: TRANSESOPHAGEAL ECHOCARDIOGRAM (TEE);  Surgeon: Laurey Morale, MD;  Location: [facility] ENDOSCOPY;  Service: Cardiovascular;  Laterality: N/A; HPI: 50 y.o. male with h/o depression recently in hospital from 3/4-4/6 after for meningitis.  During that hospital admit, pt suffered meningitis, suffered cardiac arrest, cardiogenic shock with acute systolic CHF with trach placed 3/24 and pt was found to have dysphagia.  Pt returned 4/16 with hypothermia- severe sepsis.  Left gaze preference, basal ganglia abnormality, left corona radiata cva per notes.  Pt removed his tube for feeding last pm and swallow eval ordered.  MBS indicated due to  h/o dysphagia and possible worsening with acute issues.   Subjective: pt awake in chair Assessment / Plan / Recommendation CHL IP CLINICAL IMPRESSIONS 03/31/2016 Therapy Diagnosis Moderate oropharyngeal dysphagia Clinical Impression Pt exhibited mild oropharyngeal dysphagia with sensorimotor deficits.  Weak lingual manipulation resulted in decreased bolus cohesion, premature spillage and decreased propulsion.  Pt required multilpe boluses of pudding to transit barium tablet - recommend crushed medications if not contranindicated.  Delayed swallow initiation to the vallecula and pyriform sinuses observed due to decreased sensation.  Due to pt's decreased mental status, delayed swallow response, weak cough and oral weakness- recommend Dysphagia 1 textures nectar thick liquid and meds crushed in puree.  Pt educated re: diet recommendation.  SLP will f/u to determine diet tolerance and advise dietary advancement.   Impact on safety and function --   CHL IP TREATMENT RECOMMENDATION 03/31/2016 Treatment Recommendations Therapy as outlined in treatment plan below   Prognosis 03/31/2016 Prognosis for Safe Diet Advancement Good Barriers to Reach Goals Motivation Barriers/Prognosis Comment -- CHL IP DIET RECOMMENDATION  03/31/2016 SLP Diet Recommendations Dysphagia 1 (Puree) solids;Nectar thick liquid Liquid Administration via Cup;Straw Medication Administration Crushed with puree Compensations Slow rate;Small sips/bites Postural Changes --   CHL IP OTHER RECOMMENDATIONS 03/07/2016 Recommended Consults -- Oral Care Recommendations Oral care BID Other Recommendations Order thickener from pharmacy;Prohibited food (jello, ice cream, thin soups);Remove water pitcher;Have oral suction available;Place PMSV during PO intake   CHL IP FOLLOW UP RECOMMENDATIONS 03/09/2016 Follow up Recommendations Skilled Nursing facility   [facility] IP FREQUENCY AND DURATION 03/31/2016 Speech Therapy Frequency (ACUTE ONLY) min 2x/week Treatment Duration 1 week       CHL IP ORAL PHASE 03/31/2016 Oral Phase -- Oral - Pudding Teaspoon -- Oral - Pudding Cup -- Oral - Honey Teaspoon -- Oral - Honey Cup -- Oral - Nectar Teaspoon Weak lingual manipulation;Reduced posterior propulsion Oral - Nectar Cup Weak lingual manipulation;Decreased bolus cohesion;Reduced posterior propulsion;Premature spillage Oral - Nectar Straw Weak lingual manipulation;Decreased bolus cohesion;Reduced posterior propulsion;Premature spillage Oral - Thin Teaspoon Weak lingual manipulation;Decreased bolus cohesion;Reduced posterior propulsion;Premature spillage Oral - Thin Cup Weak lingual manipulation;Decreased bolus cohesion;Reduced posterior propulsion;Premature spillage Oral - Thin Straw Weak lingual manipulation;Decreased bolus cohesion;Reduced posterior propulsion;Premature spillage Oral - Puree Weak lingual manipulation;Decreased bolus cohesion;Reduced posterior propulsion;Premature spillage Oral - Mech Soft Weak lingual manipulation;Decreased bolus cohesion;Reduced posterior propulsion;Premature spillage Oral - Regular -- Oral - Multi-Consistency -- Oral - Pill Weak lingual manipulation;Decreased bolus cohesion;Reduced posterior propulsion;Other (Comment);Premature spillage Oral Phase - Comment --  CHL IP PHARYNGEAL PHASE 03/31/2016 Pharyngeal Phase -- Pharyngeal- Pudding Teaspoon -- Pharyngeal -- Pharyngeal- Pudding Cup -- Pharyngeal -- Pharyngeal- Honey Teaspoon -- Pharyngeal -- Pharyngeal- Honey Cup -- Pharyngeal -- Pharyngeal- Nectar Teaspoon Delayed swallow initiation-pyriform sinuses Pharyngeal -- Pharyngeal- Nectar Cup Delayed swallow initiation-pyriform sinuses Pharyngeal -- Pharyngeal- Nectar Straw Delayed swallow initiation-pyriform sinuses Pharyngeal -- Pharyngeal- Thin Teaspoon Delayed swallow initiation-pyriform sinuses Pharyngeal -- Pharyngeal- Thin Cup Delayed swallow initiation-pyriform sinuses Pharyngeal -- Pharyngeal- Thin Straw Delayed swallow initiation-pyriform sinuses Pharyngeal --  Pharyngeal- Puree Delayed swallow initiation-vallecula Pharyngeal -- Pharyngeal- Mechanical Soft Delayed swallow initiation-vallecula Pharyngeal -- Pharyngeal- Regular -- Pharyngeal -- Pharyngeal- Multi-consistency -- Pharyngeal -- Pharyngeal- Pill Delayed swallow initiation-vallecula Pharyngeal -- Pharyngeal Comment --    Donavan Burnet, MS [facility] SLP [phone]           Dg Swallowing Func-speech Pathology  03/07/2016  Objective Swallowing Evaluation: Type of Study: MBS-Modified Barium Swallow Study Patient Details Name: JEROMIAH OHALLORAN MRN: [id number] Date of Birth: 1966/06/18 Today's Date: 03/07/2016 Time: SLP Start Time (ACUTE ONLY): 0928-SLP Stop Time (ACUTE ONLY): 1478 SLP Time Calculation (min) (ACUTE ONLY): 24 min Past Medical History: Past Medical History Diagnosis Date . Depression  Past Surgical History: Past Surgical History Procedure Laterality Date . Ankle surgery   . Cardiac catheterization N/A 02/17/2016   Procedure: IABP Insertion;  Surgeon: Iran Ouch, MD;  Location: [facility] INVASIVE CV LAB;  Service: Cardiovascular;  Laterality: N/A; HPI: 50 y.o. male with h/o depression who initially presented to [facility] ED 3/11 with URI symptoms and was d/c with Tamiflu. Family brought pt to ED 3/14 with leukocytosis, fever of 103, decreased consciousness and minimally responsive. 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 Pt intubated in ED, trach placed 3/24 due to inability to wean off vent. No Data Recorded Assessment / Plan / Recommendation CHL IP CLINICAL IMPRESSIONS 03/07/2016 Therapy Diagnosis Mild oral phase dysphagia;Mild pharyngeal phase dysphagia Clinical Impression MBS complete with PMSV donned throughout assessment. Pt exhibited mild oropharyngeal dysphagia during this study due  to sensory deficits. Weak lingual manipulation resulted in decreased bolus cohesion. Delayed swallow initiation to the vallecula and pyriform sinuses observed, resulting in  silent penetration above the vocal cords x1 with intake of thin liquids. No compensatory strategies attempted due to altered mental status and inability to comply. No airway compromise noted for all other consistencies and no pharyngeal residue observed. Due to pt's decreased mental status, recommend Dysphagia 1 (puree) textures to prevent pocketing with nectar thick liquids (no straws) and meds crushed in puree, with PMSV donned. Pt and wife educated re: diet recommendation and PMSV use/precautions. SLP will f/u to determine diet tolerance and advise diet tolerance. Impact on safety and function (No Data)   CHL IP TREATMENT RECOMMENDATION 03/07/2016 Treatment Recommendations Therapy as outlined in treatment plan below   Prognosis 03/07/2016 Prognosis for Safe Diet Advancement Good Barriers to Reach Goals Severity of deficits;Time post onset Barriers/Prognosis Comment -- CHL IP DIET RECOMMENDATION 03/07/2016 SLP Diet Recommendations Dysphagia 1 (Puree) solids;Nectar thick liquid Liquid Administration via Cup;No straw Medication Administration Crushed with puree Compensations Minimize environmental distractions;Slow rate;Small sips/bites Postural Changes Seated upright at 90 degrees   CHL IP OTHER RECOMMENDATIONS 03/07/2016 Recommended Consults -- Oral Care Recommendations Oral care BID Other Recommendations Order thickener from pharmacy;Prohibited food (jello, ice cream, thin soups);Remove water pitcher;Have oral suction available;Place PMSV during PO intake   CHL IP FOLLOW UP RECOMMENDATIONS 03/07/2016 Follow up Recommendations (No Data)   CHL IP FREQUENCY AND DURATION 03/07/2016 Speech Therapy Frequency (ACUTE ONLY) min 2x/week Treatment Duration 2 weeks      CHL IP ORAL PHASE 03/07/2016 Oral Phase Impaired Oral - Pudding Teaspoon -- Oral - Pudding Cup -- Oral - Honey Teaspoon -- Oral - Honey Cup -- Oral - Nectar Teaspoon -- Oral - Nectar Cup Decreased bolus cohesion;Weak lingual manipulation Oral - Nectar Straw (No Data) Oral  - Thin Teaspoon -- Oral - Thin Cup Decreased bolus cohesion;Weak lingual manipulation Oral - Thin Straw -- Oral - Puree Decreased bolus cohesion;Weak lingual manipulation Oral - Mech Soft -- Oral - Regular -- Oral - Multi-Consistency -- Oral - Pill -- Oral Phase - Comment --  CHL IP PHARYNGEAL PHASE 03/07/2016 Pharyngeal Phase Impaired Pharyngeal- Pudding Teaspoon -- Pharyngeal -- Pharyngeal- Pudding Cup -- Pharyngeal -- Pharyngeal- Honey Teaspoon -- Pharyngeal -- Pharyngeal- Honey Cup -- Pharyngeal -- Pharyngeal- Nectar Teaspoon -- Pharyngeal -- Pharyngeal- Nectar Cup Delayed swallow initiation-pyriform sinuses;Penetration/Aspiration during swallow Pharyngeal Material enters airway, remains ABOVE vocal cords and not ejected out Pharyngeal- Nectar Straw (No Data) Pharyngeal -- Pharyngeal- Thin Teaspoon -- Pharyngeal -- Pharyngeal- Thin Cup Penetration/Aspiration during swallow;Delayed swallow initiation-pyriform sinuses Pharyngeal Material enters airway, remains ABOVE vocal cords and not ejected out Pharyngeal- Thin Straw -- Pharyngeal -- Pharyngeal- Puree Delayed swallow initiation-vallecula Pharyngeal -- Pharyngeal- Mechanical Soft -- Pharyngeal -- Pharyngeal- Regular -- Pharyngeal -- Pharyngeal- Multi-consistency -- Pharyngeal -- Pharyngeal- Pill -- Pharyngeal -- Pharyngeal Comment --  CHL IP CERVICAL ESOPHAGEAL PHASE 03/07/2016 Cervical Esophageal Phase WFL Pudding Teaspoon -- Pudding Cup -- Honey Teaspoon -- Honey Cup -- Nectar Teaspoon -- Nectar Cup -- Nectar Straw -- Thin Teaspoon -- Thin Cup -- Thin Straw -- Puree -- Mechanical Soft -- Regular -- Multi-consistency -- Pill -- Cervical Esophageal Comment -- No flowsheet data found. Royce MacadamiaLitaker, Lisa Willis 03/07/2016, 11:17 AM Breck CoonsLisa Willis Lonell FaceLitaker M.Ed CCC-SLP Pager [phone]               Time Spent in minutes  30   Susa RaringSINGH,Lashondra Vaquerano K M.D on 04/02/2016 at 11:26 AM  Between 7am to 7pm - Pager - [phone]  After 7pm go to [email or web address] - password [facility]  [facility] -  Office  [phone]

## 2016-04-02 NOTE — Progress Notes (Signed)
CBG checked 62. Alert and oriented to self. Administered 4 oz of nectar orange juice. Rechecked CBG 78. Will continue to provide nourishment PO. NP made aware.

## 2016-04-03 LAB — BASIC METABOLIC PANEL
ANION GAP: 11 (ref 5–15)
BUN: 17 mg/dL (ref 6–20)
CALCIUM: 8.7 mg/dL — AB (ref 8.9–10.3)
CO2: 30 mmol/L (ref 22–32)
CREATININE: 0.87 mg/dL (ref 0.61–1.24)
Chloride: 103 mmol/L (ref 101–111)
GFR calc Af Amer: >60 mL/min (ref >60)
GFR calc non Af Amer: >60 mL/min (ref >60)
GLUCOSE: 96 mg/dL (ref 65–99)
Potassium: 3.9 mmol/L (ref 3.5–5.1)
Sodium: 144 mmol/L (ref 135–145)

## 2016-04-03 LAB — CBC
HEMATOCRIT: 29.5 % — AB (ref 39.0–52.0)
Hemoglobin: 10 g/dL — ABNORMAL LOW (ref 13.0–17.0)
MCH: 30.1 pg (ref 26.0–34.0)
MCHC: 33.9 g/dL (ref 30.0–36.0)
MCV: 88.9 fL (ref 78.0–100.0)
Platelets: 199 10*3/uL (ref 150–400)
RBC: 3.32 MIL/uL — ABNORMAL LOW (ref 4.22–5.81)
RDW: 14.1 % (ref 11.5–15.5)
WBC: 7.1 10*3/uL (ref 4.0–10.5)

## 2016-04-03 LAB — GLUCOSE, CAPILLARY
GLUCOSE-CAPILLARY: 94 mg/dL (ref 65–99)
GLUCOSE-CAPILLARY: 88 mg/dL (ref 65–99)
GLUCOSE-CAPILLARY: 96 mg/dL (ref 65–99)

## 2016-04-03 LAB — CORTISOL: Cortisol, Plasma: 1.2 ug/dL

## 2016-04-03 LAB — MAGNESIUM: Magnesium: 1.8 mg/dL (ref 1.7–2.4)

## 2016-04-03 MED ORDER — LORAZEPAM 2 MG/ML IJ SOLN
0.5000 mg | Freq: Once | INTRAMUSCULAR | Status: AC
  Administered 2016-04-03: 0.5 mg via INTRAVENOUS
  Filled 2016-04-03: qty 1

## 2016-04-03 MED ORDER — WHITE PETROLATUM GEL
Status: AC
  Administered 2016-04-03: 11:00:00
  Filled 2016-04-03: qty 1

## 2016-04-03 MED ORDER — PREDNISONE 5 MG PO TABS
5.0000 mg | ORAL_TABLET | Freq: Every day | ORAL | Status: DC
  Administered 2016-04-04 – 2016-04-05 (×2): 5 mg via ORAL
  Filled 2016-04-03 (×2): qty 1

## 2016-04-03 MED ORDER — SODIUM CHLORIDE 0.9% FLUSH
10.0000 mL | INTRAVENOUS | Status: DC | PRN
Start: 2016-04-03 — End: 2016-04-06
  Administered 2016-04-03 – 2016-04-04 (×2): 10 mL
  Filled 2016-04-03: qty 40

## 2016-04-03 NOTE — Progress Notes (Signed)
RN able to make contact with patient's wife to give update. Wife states "thank y'all for taking care of him, appreciate y'all"

## 2016-04-03 NOTE — Progress Notes (Addendum)
PROGRESS NOTE                                                                                                                                                                                                             Patient Demographics:    Dale [name], is a 50 y.o. male, DOB - Mar 21, 1966, ZOX:[id number]  Admit date - 03/20/2016   Admitting Physician Orville Govern, MD  Outpatient Primary MD for the patient is No primary care provider on file.  LOS - 14  Outpatient Specialists:   Chief Complaint  Patient presents with  . Fall       Brief Narrative  50 yo M with hx homelessness, admitted 3-14 to 03-10-16 with streptococcal (CSF Pneumococcus R-PEN) meningitis. His course was complicated by v-fib and respiratory arrest, as well as ARF due to ATN. He received 14 days of ceftriaxone, and weekly IM Pen G (for positive RPR 1:2). He was d/c to SNF. Non-verbal. At SNF his mental status was stable.  He returned to the ED on 4-16 after being found down. He was hypothermic. He was admitted to critical care on 4/16 with Severe Sepsis and transferred to hospitalist service on 4/19.  He was started on vanco/ceftriaxone. He had repeat LP showing 400 WBC (75%N), Prot > 600, Glc 37. He had solumedrol added. His CT head suggested subacute infarcts of basal ganglia. MRI also showed these and suggested them as residual from his previous meningitis. (per Dr. Ninetta Lights) TEE no endocarditis, palliative team for goal of care, family meeting pending 4/26, steroid tapering started. Awaits placement.    Subjective:    Dale [name] today has, No headache, No chest pain, No abdominal pain - No Nausea, No new weakness tingling or numbness, No Cough - SOB.    Assessment  & Plan :    1. 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 Patient has streptococcal pneumonia isolated from blood and CSF cultures on 02/16/2016 on a previous admission. Seen by ID.  Initially was treated with vancomycin. Per ID vancomycin was stopped Rocephin continued. PICC line placed. Stop date for IV Rocephin as 04/29/2016. Must follow with ID prior to stopping this medication. He had an unremarkable TEE. Clinically sepsis physiology has resolved.  2. Acute encephalopathy with right-sided hemi-neglect with L Corona Radiata infarct & Basal ganglia abnormalities noted on MRI.  Encephalopathy  and Infarct per neuro are meningitis effect. Neurology saw the patient, they question if patient has cerebral vasculitis due to underlying infections. They have recommended for Rx with antibiotics as above. I discussed his case with neurologist Dr. Roda Shutters on 04/01/2016, per neurology rapidly taper off steroids, switched to oral prednisone with rapid taper within 2-3 days.  3. Ur. retention. Placed on Flomax. Discontinued Foley catheter on 04/02/2016 .  4. Dyslipidemia. On statin.  5. Dysphagia. Speech following. Underwent modified barium swallow. Currently on dysphagia 1 diet with nectar thick fluids.  6. Severe protein calorie malnutrition. On protein supplementation.  7. Hypernatremia and ARF. Due to dehydration. Resolved with IV fluids.  8. 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 Compensated. Currently on beta blocker, aspirin and statin.  9. Mild hypoglycemia. Likely due to poor oral intake, increase nutritional supplementation. Stable TSH & A1c. Monitor CBGs every 6 hours.  CBG (last 3)   Recent Labs  04/02/16 1613 04/02/16 2332 04/03/16 0628  GLUCAP 112* 77 94   Lab Results  Component Value Date   TSH 2.712 03/20/2016   Lab Results  Component Value Date   HGBA1C 5.8* 03/27/2016    10. Low a.m. cortisol of 1.2 checked on 04/03/2016 due to low CBGs. This likely is reflective of cortisol suppression from steroid use, his CBGs have improved after good oral intake. We will continue to monitor clinically. Currently not hypotensive orthostatic. Electrolytes are  stable. Continue to monitor.   Code Status : Full  Family Communication  : On present  Disposition Plan  : SNF when available  Barriers For Discharge : SNF bed  Consults  :     Neurology  ID Palliative care  Procedures  :    4/18 TTE - EF 45-50% - diffuse hypokinesis - grade 1 diastolic dysfunction.  DVT Prophylaxis  :  Lovenox   Lab Results  Component Value Date   PLT 199 04/03/2016    Antibiotics  :    Anti-infectives    Start     Dose/Rate Route Frequency Ordered Stop   03/31/16 1800  cefTRIAXone (ROCEPHIN) 2 g in dextrose 5 % 50 mL IVPB     2 g 100 mL/hr over 30 Minutes Intravenous Every 12 hours 03/31/16 0739     03/29/16 0900  vancomycin (VANCOCIN) 1,500 mg in sodium chloride 0.9 % 500 mL IVPB  Status:  Discontinued     1,500 mg 250 mL/hr over 120 Minutes Intravenous Every 24 hours 03/28/16 0745 03/28/16 1140   03/28/16 0800  vancomycin (VANCOCIN) IVPB 1000 mg/200 mL premix     1,000 mg 200 mL/hr over 60 Minutes Intravenous  Once 03/28/16 0745 03/28/16 0920   03/28/16 0500  vancomycin (VANCOCIN) 500 mg in sodium chloride 0.9 % 100 mL IVPB  Status:  Discontinued     500 mg 100 mL/hr over 60 Minutes Intravenous Every 12 hours 03/27/16 1642 03/28/16 0745   03/24/16 1600  vancomycin (VANCOCIN) IVPB 1000 mg/200 mL premix  Status:  Discontinued     1,000 mg 200 mL/hr over 60 Minutes Intravenous Every 24 hours 03/24/16 1124 03/27/16 1642   03/21/16 1000  vancomycin (VANCOCIN) 500 mg in sodium chloride 0.9 % 100 mL IVPB  Status:  Discontinued     500 mg 100 mL/hr over 60 Minutes Intravenous Every 12 hours 03/20/16 1940 03/24/16 1113   03/21/16 0800  cefTRIAXone (ROCEPHIN) 2 g in dextrose 5 % 50 mL IVPB  Status:  Discontinued     2  g 100 mL/hr over 30 Minutes Intravenous Every 12 hours 03/21/16 0040 03/31/16 0739   03/20/16 2000  vancomycin (VANCOCIN) IVPB 1000 mg/200 mL premix     1,000 mg 200 mL/hr over 60 Minutes Intravenous NOW 03/20/16 1940 03/20/16 2103    03/20/16 1930  cefTRIAXone (ROCEPHIN) 2 g in dextrose 5 % 50 mL IVPB     2 g 100 mL/hr over 30 Minutes Intravenous  Once 03/20/16 1919 03/20/16 2030        Objective:   Filed Vitals:   04/03/16 0143 04/03/16 0603 04/03/16 0646 04/03/16 0900  BP: 141/86 129/73 121/70 133/98  Pulse: 82 76  89  Temp: 97.9 F (36.6 C) 98 F (36.7 C)  98.1 F (36.7 C)  TempSrc: Oral Oral  Oral  Resp: 20 20  19   Height:      Weight:      SpO2: 100% 100%  100%    Wt Readings from Last 3 Encounters:  04/01/16 60.2 kg (132 lb 11.5 oz)  03/15/16 55.974 kg (123 lb 6.4 oz)  03/14/16 55.906 kg (123 lb 4 oz)     Intake/Output Summary (Last 24 hours) at 04/03/16 1052 Last data filed at 04/03/16 0900  Gross per 24 hour  Intake    360 ml  Output    800 ml  Net   -440 ml     Physical Exam  Awake, appears weak ++, Right-sided neglect, No new F.N deficits,   [place].AT,PERRAL Supple Neck,No JVD, No cervical lymphadenopathy appriciated.  Symmetrical Chest wall movement, Good air movement bilaterally, CTAB RRR,No Gallops,Rubs or new Murmurs, No Parasternal Heave +ve B.Sounds, Abd Soft, No tenderness, No organomegaly appriciated, No rebound - guarding or rigidity.   No Cyanosis, Clubbing or edema, No new Rash or bruise      Data Review:    CBC  Recent Labs Lab 03/29/16 0425 03/31/16 0716 04/01/16 0500 04/02/16 0507 04/03/16 0905  WBC 15.0* 16.7* 14.0* 13.4* 7.1  HGB 10.4* 10.4* 10.4* 9.7* 10.0*  HCT 31.2* 30.8* 31.5* 28.7* 29.5*  PLT 181 195 209 208 199  MCV 86.0 84.6 86.3 88.3 88.9  MCH 28.7 28.6 28.5 29.8 30.1  MCHC 33.3 33.8 33.0 33.8 33.9  RDW 13.8 13.6 13.7 14.1 14.1    Chemistries   Recent Labs Lab 03/28/16 0400 03/29/16 0425 03/31/16 0716 04/01/16 0500 04/01/16 0700 04/02/16 0507 04/03/16 0905  NA 144 142 142 141  --  143 144  K 3.7 4.6 4.2 4.0  --  4.1 3.9  CL 105 101 103 102  --  106 103  CO2 28 31 28 28   --  28 30  GLUCOSE 115* 106* 87 102*  --  106* 96  BUN 26*  24* 27* 26*  --  22* 17  CREATININE 1.21 1.21 1.07 1.07  --  1.06 0.87  CALCIUM 8.1* 8.1* 8.3* 8.5*  --  8.3* 8.7*  MG  --  2.1  --   --  2.0  --  1.8  AST 22  --   --   --   --   --   --   ALT 29  --   --   --   --   --   --   ALKPHOS 65  --   --   --   --   --   --   BILITOT 0.3  --   --   --   --   --   --    ------------------------------------------------------------------------------------------------------------------  No results for input(s): CHOL, HDL, LDLCALC, TRIG, CHOLHDL, LDLDIRECT in the last 72 hours.  Lab Results  Component Value Date   HGBA1C 5.8* 03/27/2016   ------------------------------------------------------------------------------------------------------------------ No results for input(s): TSH, T4TOTAL, T3FREE, THYROIDAB in the last 72 hours.  Invalid input(s): FREET3 ------------------------------------------------------------------------------------------------------------------ No results for input(s): VITAMINB12, FOLATE, FERRITIN, TIBC, IRON, RETICCTPCT in the last 72 hours.  Coagulation profile No results for input(s): INR, PROTIME in the last 168 hours.  No results for input(s): DDIMER in the last 72 hours.  Cardiac Enzymes No results for input(s): CKMB, TROPONINI, MYOGLOBIN in the last 168 hours.  Invalid input(s): CK ------------------------------------------------------------------------------------------------------------------    Component Value Date/Time   BNP 23.8 03/20/2016 1800    Inpatient Medications  Scheduled Meds: . aspirin  325 mg Per Tube Daily  . atorvastatin  20 mg Oral q1800  . carvedilol  3.125 mg Per Tube BID WC  . cefTRIAXone (ROCEPHIN)  IV  2 g Intravenous Q12H  . enoxaparin (LOVENOX) injection  40 mg Subcutaneous Q24H  . famotidine  20 mg Per Tube Daily  . feeding supplement (ENSURE ENLIVE)  237 mL Oral Q6H  . feeding supplement (PRO-STAT SUGAR FREE 64)  30 mL Oral TID WC  . folic acid  1 mg Per Tube Daily  .  levETIRAcetam  500 mg Per Tube BID  . predniSONE  20 mg Oral Q breakfast  . tamsulosin  0.4 mg Oral Daily  . thiamine  100 mg Per Tube Daily  . valproic acid  250 mg Per Tube Q8H   Continuous Infusions:   PRN Meds:.RESOURCE THICKENUP CLEAR  Micro Results No results found for this or any previous visit (from the past 240 hour(s)).  Radiology Reports Ct Angio Head W/cm &/or Wo Cm  03/22/2016  CLINICAL DATA:  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 Now with multiple basal ganglia infarcts. EXAM: CT ANGIOGRAPHY HEAD AND NECK TECHNIQUE: Multidetector CT imaging of the head and neck was performed using the standard protocol during bolus administration of intravenous contrast. Multiplanar CT image reconstructions and MIPs were obtained to evaluate the vascular anatomy. Carotid stenosis measurements (when applicable) are obtained utilizing NASCET criteria, using the distal internal carotid diameter as the denominator. CONTRAST:  50 mL Isovue 370 IV COMPARISON:  MRI 03/21/2016 FINDINGS: CT HEAD Brain: Generalized atrophy. Multiple areas of enhancement in the basal ganglia bilaterally best seen on MRI. Patchy low-density areas are noted in the basal ganglia bilaterally which may be due to infarction. No acute hemorrhage or mass lesion. No shift of the midline structures. Calvarium and skull base: Negative Paranasal sinuses: Mucosal edema left maxillary sinus. Mild mucosal edema sphenoid sinus. Orbits: Negative CTA NECK Aortic arch: Normal aortic arch. Proximal great vessels widely patent. Bovine branching pattern. Right carotid system: Right common carotid artery widely patent. Right carotid bifurcation widely patent Left carotid system: Left common carotid artery widely patent. Left carotid bifurcation widely patent. Vertebral arteries:Both vertebral arteries widely patent without significant stenosis. Skeleton: Negative Other neck: Negative CTA HEAD Anterior circulation: Cavernous carotid is patent bilaterally.  Anterior and middle cerebral arteries are patent bilaterally. No occluded segments. The A1 and M1 segments are smaller in caliber wall caliber than expected. This could be due to basilar meningitis or spasm. MCA branches appear patent bilateral without significant irregularity to suggest vasculitis. Posterior circulation: Both vertebral arteries patent to the basilar. PICA patent bilaterally. Basilar widely patent. Superior cerebellar and posterior cerebral arteries patent bilaterally without stenosis. Venous sinuses: Negative Anatomic variants: Negative Delayed phase: Subtle areas of  enhancement in the left basal ganglia as noted on MRI. No definite abnormal enhancement in the right basal ganglia. Small area of cortical enhancement in the right parietal cortex. These areas are most consistent with subacute infarction. IMPRESSION: Circle of Willis patent without focal stenosis or branch occlusion. Anterior and middle cerebral arteries show diffuse narrowing which may be due to basilar meningitis or vasospasm. This is a subjective findings and is diffuse. Negative CTA neck Subtle enhancement in the left basal ganglia and right parietal cortex corresponding to areas of abnormal enhancement on the MRI. 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 Electronically Signed   By: Marlan Palau M.D.   On: 03/22/2016 12:24   Dg Chest 1 View  03/05/2016  CLINICAL DATA:  Hypoxia. EXAM: CHEST 1 VIEW COMPARISON:  March 03, 2016. FINDINGS: Stable cardiomediastinal silhouette. Tracheostomy and nasogastric tubes are unchanged in position. Right-sided PICC line is stable. No pneumothorax is noted. Improved bibasilar opacities are noted consistent with improving pulmonary edema. Bony thorax is unremarkable. IMPRESSION: Stable support apparatus. Improved bibasilar opacities consistent with improving edema. Electronically Signed   By:  Lupita Raider, M.D.   On: 03/05/2016 11:58   Ct Head Wo Contrast  03/20/2016  CLINICAL DATA:  Patient status post witnessed fall. Abrasion to the left periorbital soft tissues. EXAM: CT HEAD WITHOUT CONTRAST CT CERVICAL SPINE WITHOUT CONTRAST TECHNIQUE: Multidetector CT imaging of the head and cervical spine was performed following the standard protocol without intravenous contrast. Multiplanar CT image reconstructions of the cervical spine were also generated. COMPARISON:  Brain CT 02/17/2016; 02/16/2016. FINDINGS: CT HEAD FINDINGS Ventricles and sulci are appropriate for patient's age. Interval development of left-greater-than-right basal ganglia hypodensities. No evidence for acute intracranial hemorrhage, mass lesion or mass-effect. Orbits are unremarkable. Paranasal sinuses are well aerated. Mucosal thickening left maxillary sinus. Mastoid air cells are unremarkable. Calvarium is intact. CT CERVICAL SPINE FINDINGS Normal anatomic alignment. No evidence for acute cervical spine fracture. Craniocervical junction is intact. Preservation of the vertebral body and intervertebral disc space heights. 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 Lung apices are clear. Thyroid is unremarkable. Prevertebral soft tissues unremarkable. IMPRESSION: Interval development of hypodensities within the left-greater-than-right basal ganglia which may represent subacute infarcts. No acute cervical spine fracture. Electronically Signed   By: Annia Belt M.D.   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 Now with multiple basal ganglia infarcts. EXAM: CT ANGIOGRAPHY HEAD AND NECK TECHNIQUE: Multidetector CT imaging of the head and neck was performed using the standard protocol during bolus administration of intravenous contrast. Multiplanar CT image reconstructions and MIPs were obtained to evaluate the vascular anatomy. Carotid stenosis measurements  (when applicable) are obtained utilizing NASCET criteria, using the distal internal carotid diameter as the denominator. CONTRAST:  50 mL Isovue 370 IV COMPARISON:  MRI 03/21/2016 FINDINGS: CT HEAD Brain: Generalized atrophy. Multiple areas of enhancement in the basal ganglia bilaterally best seen on MRI. Patchy low-density areas are noted in the basal ganglia bilaterally which may be due to infarction. No acute hemorrhage or mass lesion. No shift of the midline structures. Calvarium and skull base: Negative Paranasal sinuses: Mucosal edema left maxillary sinus. Mild mucosal edema sphenoid sinus. Orbits: Negative CTA NECK Aortic arch: Normal aortic arch. Proximal great vessels widely patent. Bovine branching pattern. Right carotid system: Right common carotid artery widely patent. Right carotid bifurcation  widely patent Left carotid system: Left common carotid artery widely patent. Left carotid bifurcation widely patent. Vertebral arteries:Both vertebral arteries widely patent without significant stenosis. Skeleton: Negative Other neck: Negative CTA HEAD Anterior circulation: Cavernous carotid is patent bilaterally. Anterior and middle cerebral arteries are patent bilaterally. No occluded segments. The A1 and M1 segments are smaller in caliber wall caliber than expected. This could be due to basilar meningitis or spasm. MCA branches appear patent bilateral without significant irregularity to suggest vasculitis. Posterior circulation: Both vertebral arteries patent to the basilar. PICA patent bilaterally. Basilar widely patent. Superior cerebellar and posterior cerebral arteries patent bilaterally without stenosis. Venous sinuses: Negative Anatomic variants: Negative Delayed phase: Subtle areas of enhancement in the left basal ganglia as noted on MRI. No definite abnormal enhancement in the right basal ganglia. Small area of cortical enhancement in the right parietal cortex. These areas are most consistent with  subacute infarction. IMPRESSION: Circle of Willis patent without focal stenosis or branch occlusion. Anterior and middle cerebral arteries show diffuse narrowing which may be due to basilar meningitis or vasospasm. This is a subjective findings and is diffuse. Negative CTA neck Subtle enhancement in the left basal ganglia and right parietal cortex corresponding to areas of abnormal enhancement on the MRI. 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 Electronically Signed   By: Marlan Palau M.D.   On: 03/22/2016 12:24   Ct Cervical Spine Wo Contrast  03/20/2016  CLINICAL DATA:  Patient status post witnessed fall. Abrasion to the left periorbital soft tissues. EXAM: CT HEAD WITHOUT CONTRAST CT CERVICAL SPINE WITHOUT CONTRAST TECHNIQUE: Multidetector CT imaging of the head and cervical spine was performed following the standard protocol without intravenous contrast. Multiplanar CT image reconstructions of the cervical spine were also generated. COMPARISON:  Brain CT 02/17/2016; 02/16/2016. FINDINGS: CT HEAD FINDINGS Ventricles and sulci are appropriate for patient's age. Interval development of left-greater-than-right basal ganglia hypodensities. No evidence for acute intracranial hemorrhage, mass lesion or mass-effect. Orbits are unremarkable. Paranasal sinuses are well aerated. Mucosal thickening left maxillary sinus. Mastoid air cells are unremarkable. Calvarium is intact. CT CERVICAL SPINE FINDINGS Normal anatomic alignment. No evidence for acute cervical spine fracture. Craniocervical junction is intact. Preservation of the vertebral body and intervertebral disc space heights. 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 Lung apices are clear. Thyroid is unremarkable. Prevertebral soft tissues unremarkable. IMPRESSION: Interval development of hypodensities within the left-greater-than-right basal ganglia which  may represent subacute infarcts. No acute cervical spine fracture. Electronically Signed   By: Annia Belt M.D.   On: 03/20/2016 19:15   Mr Brain Wo Contrast  03/24/2016  CLINICAL DATA:  History of streptococcal pneumonia 02/16/2016. Stroke. EXAM: MRI HEAD WITHOUT CONTRAST TECHNIQUE: Multiplanar, multiecho pulse sequences of the brain and surrounding structures were obtained without intravenous contrast. COMPARISON:  CT 03/22/2016, MRI 03/21/2016 FINDINGS: New area of acute infarct in the left corona radiata measuring 10 x 15 mm with restricted diffusion. Patchy areas of increased signal in the basal ganglia left greater than right on diffusion-weighted imaging show mild interval improvement. Restricted diffusion in the right parietal cortex also shows improvement since the prior MRI. These areas in the basal ganglia showed peripheral enhancement on the prior study. Contrast was not administered today. Small amount of hemorrhage in the left external capsule. Small amount of blood in the left frontal lobe. Small amount of blood products in the occipital horns bilaterally. These findings were present on  the prior MRI of 03/21/2016 however were not well visualized or appreciated due to motion. The these areas are not identified is hyperdense on CT. Ventricle size normal. No shift of the midline structures. No fluid collection. Minimal mucosal edema in the paranasal sinuses. Bilateral mastoid sinus effusion. Normal orbit. Normal pituitary. Normal skullbase. IMPRESSION: New area of acute infarct in the left corona radiata. Multiple areas of restricted diffusion in the basal ganglia left greater than right show mild interval improvement since the prior MRI suggesting late subacute infarct. Improvement in restricted diffusion in the right parietal cortex. Small amount of hemorrhage in the left external capsule. Mild amount of intraventricular hemorrhage. These are unchanged from the MRI of 03/21/2016 although were not  appreciated on the prior study due to motion. These results were called by telephone at the time of interpretation on 03/24/2016 at 1:36 pm to [facility] PA , who verbally acknowledged these results. Electronically Signed   By: Marlan Palau M.D.   On: 03/24/2016 13:37   Mr Laqueta Jean AV Contrast  03/21/2016  CLINICAL DATA:  Found on floor. 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 EXAM: MRI HEAD WITHOUT AND WITH CONTRAST TECHNIQUE: Multiplanar, multiecho pulse sequences of the brain and surrounding structures were obtained without and with intravenous contrast. CONTRAST:  13mL MULTIHANCE GADOBENATE DIMEGLUMINE 529 MG/ML IV SOLN COMPARISON:  CT head 03/20/2016 FINDINGS: Image quality degraded by motion. Postcontrast images have significant motion. Edema in the basal ganglia bilaterally left greater than right as noted on CT. There is enhancement in the basal ganglia bilaterally. There is abnormal enhancement in the external capsule on the left, rim enhancing enhancement in the left internal capsule and medial basal ganglia. Abnormal enhancement in the left caudate. There is enhancement in the right caudate and right putamen. Many of these areas are within vascular distribution suggesting ischemia. Small area of enhancement in the right parietal cortex could also be an area of subacute infarct. Diffusion-weighted imaging shows restricted diffusion in the left putamen and caudate and possibly in the external capsule on the left. These are most likely areas of subacute infarct. Restricted diffusion also noted in the right parietal cortex where there is abnormal enhancement. Right basal ganglia does not show significant restricted diffusion but does show T2 shine through. Ventricle size normal. No shift of the midline structures. Negative for intracranial hemorrhage. IMPRESSION: Image quality degraded by significant motion. There is extensive abnormal enhancement in the basal ganglia bilaterally. Some of these  areas show restricted diffusion left basal ganglia. The enhancement pattern and vascular territory suggest the enhancement is related to subacute infarct related to meningitis 1 month ago. Similar area of enhancement and restricted diffusion in the right parietal cortex. No intracranial hemorrhage. Electronically Signed   By: Marlan Palau M.D.   On: 03/21/2016 19:06   Dg Chest Portable 1 View  03/20/2016  CLINICAL DATA:  Recent fall with altered mental status, initial encounter EXAM: PORTABLE CHEST 1 VIEW COMPARISON:  03/05/2016 FINDINGS: The heart size and mediastinal contours are within normal limits. Both lungs are clear. The visualized skeletal structures are unremarkable. IMPRESSION: No active disease. Electronically Signed   By: Alcide Clever M.D.   On: 03/20/2016 18:16   Dg Abd Portable 1v  03/29/2016  CLINICAL DATA:  Encounter for feeding tube placement. EXAM: PORTABLE ABDOMEN - 1 VIEW COMPARISON:  Apr 24, 2016. FINDINGS: The bowel gas pattern is normal. Distal tip of feeding tube is in expected position of proximal duodenum. IMPRESSION: Distal tip of feeding  tube seen in expected position of proximal duodenum. No evidence of bowel obstruction or ileus. Electronically Signed   By: Lupita Raider, M.D.   On: 03/29/2016 12:44   Dg Abd Portable 1v  03/25/2016  CLINICAL DATA:  50 year old male with a history of nasogastric tube. New placement after displacement. EXAM: PORTABLE ABDOMEN - 1 VIEW COMPARISON:  03/23/2016 FINDINGS: Metallic tip enteric feeding tube projects over the mediastinum, coiled within the stomach lumen, directed toward the cardia of the stomach. Unremarkable appearance of lung bases. Unremarkable bowel gas pattern. IMPRESSION: Plain film of the upper abdomen demonstrates weighted metallic tip feeding tube terminating within the stomach, with the tip terminating towards the cardia. Signed, Yvone Neu. Loreta Ave, DO [facility]  Electronically Signed   By: Gilmer Mor D.O.   On: 03/25/2016 11:44   Dg Abd Portable 1v  03/23/2016  CLINICAL DATA:  Encounter for NG tube placement. EXAM: PORTABLE ABDOMEN - 1 VIEW COMPARISON:  None. FINDINGS: Tip of the weighted enteric tube in the central abdomen is likely post pyloric in the region of the third portion of the duodenum. There is a normal bowel gas pattern. Enteric contrast is seen in the last and rectosigmoid colon likely from prior modified barium swallow. IMPRESSION: Tip of the weighted enteric tube is post pyloric in the region of the third portion of the duodenum. Electronically Signed   By: Rubye Oaks M.D.   On: 03/23/2016 19:53   Dg Swallowing Func-speech Pathology  03/31/2016  Objective Swallowing Evaluation: Type of Study: MBS-Modified Barium Swallow Study Patient Details Name: Dale [name] MRN: [id number] Date of Birth: 02-May-1966 Today's Date: 03/31/2016 Time: SLP Start Time (ACUTE ONLY): 0929-SLP Stop Time (ACUTE ONLY): 0946 SLP Time Calculation (min) (ACUTE ONLY): 17 min Past Medical History: Past Medical History Diagnosis Date . Depression  . Cardiac arrest (HCC)  . Cardiogenic shock (HCC)  . Acute respiratory failure with hypoxia (HCC)  . Meningitis, streptococcal 02/16/2016 . Trichomonal urethritis in male 02/16/2016 . Tobacco abuse 02/16/2016 . Substance abuse  . ETOH abuse  . Septic shock [facility])  Past Surgical History: Past Surgical History Procedure Laterality Date . Ankle surgery   . Cardiac catheterization N/A 02/17/2016   Procedure: IABP Insertion;  Surgeon: Iran Ouch, MD;  Location: [facility] INVASIVE CV LAB;  Service: Cardiovascular;  Laterality: N/A; . Tee without cardioversion N/A 03/29/2016   Procedure: TRANSESOPHAGEAL ECHOCARDIOGRAM (TEE);  Surgeon: Laurey Morale, MD;  Location: [facility] ENDOSCOPY;  Service: Cardiovascular;  Laterality: N/A; HPI: 50 y.o. male with h/o depression recently in hospital from 3/4-4/6 after for meningitis.  During that hospital admit, pt  suffered meningitis, suffered cardiac arrest, cardiogenic shock with acute systolic CHF with trach placed 3/24 and pt was found to have dysphagia.  Pt returned 4/16 with hypothermia- severe sepsis.  Left gaze preference, basal ganglia abnormality, left corona radiata cva per notes.  Pt removed his tube for feeding last pm and swallow eval ordered.  MBS indicated due to h/o dysphagia and possible worsening with acute issues.   Subjective: pt awake in chair Assessment / Plan / Recommendation CHL IP CLINICAL IMPRESSIONS 03/31/2016 Therapy Diagnosis Moderate oropharyngeal dysphagia Clinical Impression Pt exhibited mild oropharyngeal dysphagia with sensorimotor deficits.  Weak lingual manipulation resulted in decreased bolus cohesion, premature spillage and decreased propulsion.  Pt required multilpe boluses of pudding to transit barium tablet - recommend crushed medications if not contranindicated.  Delayed swallow initiation to the vallecula and pyriform sinuses observed due  to decreased sensation.  Due to pt's decreased mental status, delayed swallow response, weak cough and oral weakness- recommend Dysphagia 1 textures nectar thick liquid and meds crushed in puree.  Pt educated re: diet recommendation.  SLP will f/u to determine diet tolerance and advise dietary advancement.   Impact on safety and function --   CHL IP TREATMENT RECOMMENDATION 03/31/2016 Treatment Recommendations Therapy as outlined in treatment plan below   Prognosis 03/31/2016 Prognosis for Safe Diet Advancement Good Barriers to Reach Goals Motivation Barriers/Prognosis Comment -- CHL IP DIET RECOMMENDATION 03/31/2016 SLP Diet Recommendations Dysphagia 1 (Puree) solids;Nectar thick liquid Liquid Administration via Cup;Straw Medication Administration Crushed with puree Compensations Slow rate;Small sips/bites Postural Changes --   CHL IP OTHER RECOMMENDATIONS 03/07/2016 Recommended Consults -- Oral Care Recommendations Oral care BID Other Recommendations  Order thickener from pharmacy;Prohibited food (jello, ice cream, thin soups);Remove water pitcher;Have oral suction available;Place PMSV during PO intake   CHL IP FOLLOW UP RECOMMENDATIONS 03/09/2016 Follow up Recommendations Skilled Nursing facility   [facility] IP FREQUENCY AND DURATION 03/31/2016 Speech Therapy Frequency (ACUTE ONLY) min 2x/week Treatment Duration 1 week      CHL IP ORAL PHASE 03/31/2016 Oral Phase -- Oral - Pudding Teaspoon -- Oral - Pudding Cup -- Oral - Honey Teaspoon -- Oral - Honey Cup -- Oral - Nectar Teaspoon Weak lingual manipulation;Reduced posterior propulsion Oral - Nectar Cup Weak lingual manipulation;Decreased bolus cohesion;Reduced posterior propulsion;Premature spillage Oral - Nectar Straw Weak lingual manipulation;Decreased bolus cohesion;Reduced posterior propulsion;Premature spillage Oral - Thin Teaspoon Weak lingual manipulation;Decreased bolus cohesion;Reduced posterior propulsion;Premature spillage Oral - Thin Cup Weak lingual manipulation;Decreased bolus cohesion;Reduced posterior propulsion;Premature spillage Oral - Thin Straw Weak lingual manipulation;Decreased bolus cohesion;Reduced posterior propulsion;Premature spillage Oral - Puree Weak lingual manipulation;Decreased bolus cohesion;Reduced posterior propulsion;Premature spillage Oral - Mech Soft Weak lingual manipulation;Decreased bolus cohesion;Reduced posterior propulsion;Premature spillage Oral - Regular -- Oral - Multi-Consistency -- Oral - Pill Weak lingual manipulation;Decreased bolus cohesion;Reduced posterior propulsion;Other (Comment);Premature spillage Oral Phase - Comment --  CHL IP PHARYNGEAL PHASE 03/31/2016 Pharyngeal Phase -- Pharyngeal- Pudding Teaspoon -- Pharyngeal -- Pharyngeal- Pudding Cup -- Pharyngeal -- Pharyngeal- Honey Teaspoon -- Pharyngeal -- Pharyngeal- Honey Cup -- Pharyngeal -- Pharyngeal- Nectar Teaspoon Delayed swallow initiation-pyriform sinuses Pharyngeal -- Pharyngeal- Nectar Cup Delayed swallow  initiation-pyriform sinuses Pharyngeal -- Pharyngeal- Nectar Straw Delayed swallow initiation-pyriform sinuses Pharyngeal -- Pharyngeal- Thin Teaspoon Delayed swallow initiation-pyriform sinuses Pharyngeal -- Pharyngeal- Thin Cup Delayed swallow initiation-pyriform sinuses Pharyngeal -- Pharyngeal- Thin Straw Delayed swallow initiation-pyriform sinuses Pharyngeal -- Pharyngeal- Puree Delayed swallow initiation-vallecula Pharyngeal -- Pharyngeal- Mechanical Soft Delayed swallow initiation-vallecula Pharyngeal -- Pharyngeal- Regular -- Pharyngeal -- Pharyngeal- Multi-consistency -- Pharyngeal -- Pharyngeal- Pill Delayed swallow initiation-vallecula Pharyngeal -- Pharyngeal Comment --    Donavan Burnet, MS [facility] SLP [phone]           Dg Swallowing Func-speech Pathology  03/07/2016  Objective Swallowing Evaluation: Type of Study: MBS-Modified Barium Swallow Study Patient Details Name: Dale [name] MRN: [id number] Date of Birth: 30-Sep-1966 Today's Date: 03/07/2016 Time: SLP Start Time (ACUTE ONLY): 0928-SLP Stop Time (ACUTE ONLY): 1478 SLP Time Calculation (min) (ACUTE ONLY): 24 min Past Medical History: Past Medical History Diagnosis Date . Depression  Past Surgical History: Past Surgical History Procedure Laterality Date . Ankle surgery   . Cardiac catheterization N/A 02/17/2016   Procedure: IABP Insertion;  Surgeon: Iran Ouch, MD;  Location: [facility] INVASIVE CV LAB;  Service: Cardiovascular;  Laterality: N/A; HPI: 50 y.o. male with h/o depression who initially presented to [facility]  ED 3/11 with URI symptoms and was d/c with Tamiflu. Family brought pt to ED 3/14 with leukocytosis, fever of 103, decreased consciousness and minimally responsive. 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 Pt intubated in ED, trach placed 3/24 due to inability to wean off vent. No Data Recorded Assessment / Plan / Recommendation CHL IP CLINICAL IMPRESSIONS 03/07/2016 Therapy Diagnosis Mild  oral phase dysphagia;Mild pharyngeal phase dysphagia Clinical Impression MBS complete with PMSV donned throughout assessment. Pt exhibited mild oropharyngeal dysphagia during this study due to sensory deficits. Weak lingual manipulation resulted in decreased bolus cohesion. Delayed swallow initiation to the vallecula and pyriform sinuses observed, resulting in silent penetration above the vocal cords x1 with intake of thin liquids. No compensatory strategies attempted due to altered mental status and inability to comply. No airway compromise noted for all other consistencies and no pharyngeal residue observed. Due to pt's decreased mental status, recommend Dysphagia 1 (puree) textures to prevent pocketing with nectar thick liquids (no straws) and meds crushed in puree, with PMSV donned. Pt and wife educated re: diet recommendation and PMSV use/precautions. SLP will f/u to determine diet tolerance and advise diet tolerance. Impact on safety and function (No Data)   CHL IP TREATMENT RECOMMENDATION 03/07/2016 Treatment Recommendations Therapy as outlined in treatment plan below   Prognosis 03/07/2016 Prognosis for Safe Diet Advancement Good Barriers to Reach Goals Severity of deficits;Time post onset Barriers/Prognosis Comment -- CHL IP DIET RECOMMENDATION 03/07/2016 SLP Diet Recommendations Dysphagia 1 (Puree) solids;Nectar thick liquid Liquid Administration via Cup;No straw Medication Administration Crushed with puree Compensations Minimize environmental distractions;Slow rate;Small sips/bites Postural Changes Seated upright at 90 degrees   CHL IP OTHER RECOMMENDATIONS 03/07/2016 Recommended Consults -- Oral Care Recommendations Oral care BID Other Recommendations Order thickener from pharmacy;Prohibited food (jello, ice cream, thin soups);Remove water pitcher;Have oral suction available;Place PMSV during PO intake   CHL IP FOLLOW UP RECOMMENDATIONS 03/07/2016 Follow up Recommendations (No Data)   CHL IP FREQUENCY AND DURATION  03/07/2016 Speech Therapy Frequency (ACUTE ONLY) min 2x/week Treatment Duration 2 weeks      CHL IP ORAL PHASE 03/07/2016 Oral Phase Impaired Oral - Pudding Teaspoon -- Oral - Pudding Cup -- Oral - Honey Teaspoon -- Oral - Honey Cup -- Oral - Nectar Teaspoon -- Oral - Nectar Cup Decreased bolus cohesion;Weak lingual manipulation Oral - Nectar Straw (No Data) Oral - Thin Teaspoon -- Oral - Thin Cup Decreased bolus cohesion;Weak lingual manipulation Oral - Thin Straw -- Oral - Puree Decreased bolus cohesion;Weak lingual manipulation Oral - Mech Soft -- Oral - Regular -- Oral - Multi-Consistency -- Oral - Pill -- Oral Phase - Comment --  CHL IP PHARYNGEAL PHASE 03/07/2016 Pharyngeal Phase Impaired Pharyngeal- Pudding Teaspoon -- Pharyngeal -- Pharyngeal- Pudding Cup -- Pharyngeal -- Pharyngeal- Honey Teaspoon -- Pharyngeal -- Pharyngeal- Honey Cup -- Pharyngeal -- Pharyngeal- Nectar Teaspoon -- Pharyngeal -- Pharyngeal- Nectar Cup Delayed swallow initiation-pyriform sinuses;Penetration/Aspiration during swallow Pharyngeal Material enters airway, remains ABOVE vocal cords and not ejected out Pharyngeal- Nectar Straw (No Data) Pharyngeal -- Pharyngeal- Thin Teaspoon -- Pharyngeal -- Pharyngeal- Thin Cup Penetration/Aspiration during swallow;Delayed swallow initiation-pyriform sinuses Pharyngeal Material enters airway, remains ABOVE vocal cords and not ejected out Pharyngeal- Thin Straw -- Pharyngeal -- Pharyngeal- Puree Delayed swallow initiation-vallecula Pharyngeal -- Pharyngeal- Mechanical Soft -- Pharyngeal -- Pharyngeal- Regular -- Pharyngeal -- Pharyngeal- Multi-consistency -- Pharyngeal -- Pharyngeal- Pill -- Pharyngeal -- Pharyngeal Comment --  CHL IP CERVICAL ESOPHAGEAL PHASE 03/07/2016 Cervical Esophageal Phase  WFL Pudding Teaspoon -- Pudding Cup -- Honey Teaspoon -- Honey Cup -- Nectar Teaspoon -- Nectar Cup -- Nectar Straw -- Thin Teaspoon -- Thin Cup -- Thin Straw -- Puree -- Mechanical Soft -- Regular --  Multi-consistency -- Pill -- Cervical Esophageal Comment -- No flowsheet data found. Royce Macadamia 03/07/2016, 11:17 AM Breck Coons Lonell Face.Ed CCC-SLP Pager [phone]               Time Spent in minutes  30   Siddarth Hsiung K M.D on 04/03/2016 at 10:52 AM  Between 7am to 7pm - Pager - [phone]  After 7pm go to [email or web address] - password [facility]  [facility] -  Office  [phone]

## 2016-04-03 NOTE — Progress Notes (Signed)
Called patient's wife several times- to discuss update/non-voilent restraint use--there no answer, and voicemail is full. Will continue to try

## 2016-04-04 DIAGNOSIS — E43 Unspecified severe protein-calorie malnutrition: Secondary | ICD-10-CM

## 2016-04-04 DIAGNOSIS — I6350 Cerebral infarction due to unspecified occlusion or stenosis of unspecified cerebral artery: Secondary | ICD-10-CM

## 2016-04-04 DIAGNOSIS — E46 Unspecified protein-calorie malnutrition: Secondary | ICD-10-CM | POA: Insufficient documentation

## 2016-04-04 LAB — GLUCOSE, CAPILLARY
GLUCOSE-CAPILLARY: 78 mg/dL (ref 65–99)
GLUCOSE-CAPILLARY: 117 mg/dL — AB (ref 65–99)
Glucose-Capillary: 89 mg/dL (ref 65–99)

## 2016-04-04 MED ORDER — ASPIRIN 325 MG PO TABS: 325.0000 mg | ORAL_TABLET | Freq: Every day | ORAL | Status: DC

## 2016-04-04 MED ORDER — LEVETIRACETAM 100 MG/ML PO SOLN: 500.0000 mg | Freq: Two times a day (BID) | ORAL | Status: DC

## 2016-04-04 MED ORDER — FOLIC ACID 1 MG PO TABS: 1.0000 mg | ORAL_TABLET | Freq: Every day | ORAL | Status: DC

## 2016-04-04 MED ORDER — FAMOTIDINE 40 MG/5ML PO SUSR: 20.0000 mg | Freq: Every day | ORAL | Status: DC

## 2016-04-04 MED ORDER — PRO-STAT SUGAR FREE PO LIQD: 30.0000 mL | Freq: Three times a day (TID) | ORAL | Status: DC

## 2016-04-04 MED ORDER — VALPROATE SODIUM 250 MG/5ML PO SYRP: 250.0000 mg | ORAL_SOLUTION | Freq: Three times a day (TID) | ORAL | Status: DC

## 2016-04-04 MED ORDER — CARVEDILOL 3.125 MG PO TABS: 3.1250 mg | ORAL_TABLET | Freq: Two times a day (BID) | ORAL | Status: DC

## 2016-04-04 MED ORDER — ATORVASTATIN CALCIUM 20 MG PO TABS: 20.0000 mg | ORAL_TABLET | Freq: Every day | ORAL | Status: DC

## 2016-04-04 MED ORDER — THIAMINE HCL 100 MG PO TABS: 100.0000 mg | ORAL_TABLET | Freq: Every day | ORAL | Status: DC

## 2016-04-04 MED ORDER — PREDNISONE 5 MG PO TABS: 5.0000 mg | ORAL_TABLET | Freq: Every day | ORAL | Status: DC

## 2016-04-04 MED ORDER — DEXTROSE 5 % IV SOLN: 2.0000 g | Freq: Two times a day (BID) | INTRAVENOUS | Status: DC

## 2016-04-04 MED ORDER — RESOURCE THICKENUP CLEAR PO POWD: 1.0000 | ORAL | Status: DC | PRN

## 2016-04-04 MED ORDER — ENSURE ENLIVE PO LIQD: 237.0000 mL | Freq: Four times a day (QID) | ORAL | Status: DC

## 2016-04-04 MED ORDER — TAMSULOSIN HCL 0.4 MG PO CAPS: 0.4000 mg | ORAL_CAPSULE | Freq: Every day | ORAL | Status: DC

## 2016-04-04 NOTE — Progress Notes (Signed)
Patient can discharge to SNF 24 hours after mitts have been removed.   Dale [name] LCSWA [phone]

## 2016-04-04 NOTE — Progress Notes (Signed)
PICC removed per order patient tolerated without problems.  Requested that patient lie in bed for 30 minutes post removal but patient is getting out of bed.  NT to bedside to ensure patient safety.  Gasper LloydKerry Emelynn Rance, RN VAST

## 2016-04-04 NOTE — Progress Notes (Signed)
Pt has been incontinent of stool all day, we have changed bed 6 trimes and either the NT or myself has been in and out as pt has tried to escape bed. [facility]pt found with hands on the floor. BM was all over bed. No injury noted.pt given multiple baths with complete linen changes.

## 2016-04-04 NOTE — Care Management Note (Signed)
Case Management Note  Patient Details  Name: Bonnita HollowCraig D Goddard MRN: [id number] Date of Birth: 06/05/1966  Subjective/Objective:                    Action/Plan: Plan is for patient to discharge to SNF tomorrow. CM continuing to follow for discharge needs.   Expected Discharge Date:                  Expected Discharge Plan:  Skilled Nursing Facility  In-House Referral:  Clinical Social Work  Discharge planning Services  CM Consult  Post Acute Care Choice:    Choice offered to:     DME Arranged:    DME Agency:     HH Arranged:    HH Agency:     Status of Service:  In process, will continue to follow  Medicare Important Message Given:    Date Medicare IM Given:    Medicare IM give by:    Date Additional Medicare IM Given:    Additional Medicare Important Message give by:     If discussed at Long Length of Stay Meetings, dates discussed:    Additional Comments:  Kermit BaloKelli F Allaina Brotzman, RN 04/04/2016, 3:19 PM

## 2016-04-04 NOTE — Progress Notes (Signed)
Speech Language Pathology Treatment: Dysphagia  Patient Details Name: Dale [name] MRN: [id number] DOB: 06/15/1966 Today's Date: 04/04/2016 Time: [id number] SLP Time Calculation (min) (ACUTE ONLY): 10 min  Assessment / Plan / Recommendation Clinical Impression  Pt required moderate verbal/tactile cues during self feeding for single sip consumption. Slow mastication and prolonged bolus formation requiring cues to transit with trial of upgraded Dys 3 texture. SLP recommended Dys 2 4/28 (not ordered). SLP upgraded texture to Dys 2 and continue nectar thick liquids. Decreased awareness increases aspiration risk, therefore full supervision recommended. Possible discharge to SNF soon.   HPI HPI: 50 y.o. male with h/o depression recently in hospital from 3/4-4/6 after for meningitis.  During that hospital admit, pt suffered meningitis, suffered cardiac arrest, cardiogenic shock with acute systolic CHF with trach placed 3/24 and pt was found to have dysphagia.  Pt returned 4/16 with hypothermia- severe sepsis.  Left gaze preference, basal ganglia abnormality, left corona radiata cva per notes.  Pt removed his tube for feeding last pm and swallow eval ordered.  MBS indicated due to h/o dysphagia and possible worsening with acute issues.        SLP Plan  Continue with current plan of care     Recommendations  Diet recommendations: Dysphagia 2 (fine chop);Nectar-thick liquid Liquids provided via: Cup;No straw Medication Administration: Crushed with puree Supervision: Staff to assist with self feeding;Full supervision/cueing for compensatory strategies Compensations: Slow rate;Small sips/bites;Lingual sweep for clearance of pocketing Postural Changes and/or Swallow Maneuvers: Seated upright 90 degrees             Oral Care Recommendations: Oral care BID Follow up Recommendations: Skilled Nursing facility Plan: Continue with current plan of care     GO                Dale [name], Arcadia Gorgas  Willis 04/04/2016, 2:26 PM   Dale CoonsLisa Willis Lonell FaceLitaker M.Ed [employer]-SLP Pager [phone]

## 2016-04-04 NOTE — Discharge Instructions (Signed)
Bacterial Meningitis Meningitis, or spinal meningitis, is an inflammation of the fluid and membranes that surround the brain and the spinal cord. Bacterial meningitis is a type of meningitis that is caused by bacteria. It can result in long-term problems such as seizures or hearing loss. CAUSES This condition is caused by bacteria. It can develop if bacteria enter the bloodstream and travel to the brain, or if bacteria enter the brain directly through the nose or through an open wound in the head. The condition can spread to others through germs in the lungs and throat. RISK FACTORS This condition is more likely to develop in:  People who have a cerebral shunt, cochlear implant, or similar device.  People who have an infection in the head and neck area.  People who have traveled to sub[place] or to [place].  People who have a weakened defense system (immune system).  People who live close to others, such as in dormitories.  People who have been exposed to droplets from an infected person.  Health care workers.  Daycare workers. SYMPTOMS Symptoms of this condition include:  High fever.  Headache.  Stiff neck.  Irritability.  Nausea and vomiting.  Decreased appetite.  Fatigue.  Discomfort from exposure to light or loud noise.  Trouble walking.  Altered mental status.  Seizures.  Rapidly spreading rash. The rash consists of many small, irregular purple or red spots (petechiae) on the trunk (torso), legs, feet, mucous membranes, eyes, and sometimes on the palms of the hands or soles of the feet. DIAGNOSIS This condition may be diagnosed with tests, such as:  Blood tests.  Spinal fluid tests. These tests involve a procedure in which a needle is used to obtain a sample of spinal fluid (lumbar puncture or spinal tap).  CT scan of the brain.  MRI. TREATMENT This condition is treated with antibiotic medicine. Sometimes, steroid medicine is also given to limit  swelling of the brain. Treatment must be started right away, even if it is not yet known which bacteria caused the condition. HOME CARE INSTRUCTIONS  Rest.  Take over-the-counter and prescription medicines only as told by your health care provider.  Take your antibiotic medicine as told by your health care provider. Do not stop taking the antibiotic even if you start to feel better.  Drink enough fluid to keep your urine clear or pale yellow.  Keep all follow-up visits as told by your health care provider. This is important.  Take steps to prevent spreading the condition:  Stay away from other people.  Practice good hygiene. Examples of good hygiene include washing your hands often with soap and water and covering your mouth when you cough and sneeze.  Have people who are in close contact with you talk with a health care provider about getting antibiotics or a vaccine to prevent the condition. SEEK IMMEDIATE MEDICAL CARE IF:  Your condition does not improve with treatment.  Your symptoms get worse.  You develop a high fever.  You develop a stiff neck.  You develop confusion.  You develop a rash.  You develop severe vomiting or are unable to tolerate food or fluids.  You have a headache that becomes severe or is not controlled with pain medicine.  You have a fast heartbeat.  You have a seizure.  You feel dizzy.   This information is not intended to replace advice given to you by your health care provider. Make sure you discuss any questions you have with your health care provider.  Document Released: 01/30/2002 Document Revised: 08/12/2015 Document Reviewed: 01/15/2015 Elsevier Interactive Patient Education [employer]2016 [employer]

## 2016-04-04 NOTE — Discharge Summary (Addendum)
Physician Discharge Summary  Dale [name] ZOX:[id number] DOB: 01-05-1966 DOA: 03/20/2016  PCP: No primary care provider on file. MD in SNF  Admit date: 03/20/2016 Discharge date: 04/04/2016  Recommendations for Outpatient Follow-up:   Discharge instructions    Complete by:  As directed  Stop date for Rocephin is 04/29/2016 Patient must see infectious disease before stopping Rocephin  Continue prednisone for 2 more doses on discharge and then stop       Discharge Diagnoses:  Active Problems:   Hypothermia   Severe sepsis (HCC)   Meningitis, pneumococcal, recurrent   Systolic and diastolic CHF, chronic (HCC)   Acute renal failure (HCC)   Hypernatremia   Malnutrition of moderate degree   Cerebral thrombosis with cerebral infarction   Meningoencephalitis   Encephalopathy acute   Bacterial meningitis   Acute ischemic stroke (HCC)   Sepsis (HCC)   Cerebral septic emboli [facility])   Palliative care encounter   Discharge Condition: stable   Diet recommendation: Dysphagia 1 diet   History of present illness:   Per brief narrative 04/03/2016 "50 yo M with hx homelessness, admitted 3-14 to 03-10-16 with streptococcal (CSF Pneumococcus R-PEN) meningitis. His course was complicated by v-fib and respiratory arrest, as well as ARF due to ATN. He received 14 days of ceftriaxone, and weekly IM Pen G (for positive RPR 1:2). He was d/c to SNF. Non-verbal. At SNF his mental status was stable.  He returned to the ED on 4-16 after being found down. He was hypothermic. He was admitted to critical care on 4/16 with Severe Sepsis and transferred to hospitalist service on 4/19.  He was started on vanco/ceftriaxone. He had repeat LP showing 400 WBC (75%N), Prot > 600, Glc 37. He had solumedrol added. His CT head suggested subacute infarcts of basal ganglia. MRI also showed these and suggested them as residual from his previous meningitis. (per Dr. Ninetta Lights) TEE no endocarditis, palliative team for goal of  care, family meeting pending 4/26, steroid tapering started. Awaits placement."  Hospital Course:  Sepsis due to recurrent meningitis - Patient with streptococcal pneumonia isolated from blood and CSF cultures 02/16/2016 on previous hospitalization. - Patient initially on vancomycin and Rocephin. Vancomycin stopped per infectious disease recommendations and we continued Rocephin. - Patient will continue Rocephin through 04/29/2016, must see ID prior to stopping Rocephin. - PICC line in place  - Please note TEE was unremarkable - Sepsis etiology resolved  Acute encephalopathy with right-sided hemi-neglect with L Corona Radiata infarct & Basal ganglia abnormalities noted on MRI - No significant changes in mental status - Patient seen by neurology in consultation - Their recommendation is to continue to treat for meningitis - [facility] discussed the case with neurologist Dr. Roda Shutters on 04/01/2016, per neurology rapidly taper off steroids, switched to oral prednisone with rapid taper within 2-3 days. Will continue prednisone for 2 more days on discharge and then it can be stopped.  Seizure disorder - Continue Keppra  Urinary retention - Continue Flomax  Dyslipidemia - Continue  statin.  Dysphagia - Dysphagia 1 diet per SLP  Severe protein calorie malnutrition - On protein supplementation.  Hypernatremia and ARF - Due to dehydration.  - Resolved with IV fluids.  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 Compensated. Currently on beta blocker, aspirin and statin.  Low a.m. cortisol  - Was 1.2 checked on 04/03/2016  - This likely is reflective of cortisol suppression from steroid use - Tapering steroids as noted above, 2 more dose on discharge  and then can be stopped   DVT Prophylaxis:Lovenox  Code Status : Full Family Communication; Family not at the bedside this am   Consults   Neurology   ID  Palliative care  Procedures  4/18 TTE - EF 45-50% - diffuse  hypokinesis - grade 1 diastolic dysfunction.   SignedManson Passey:  Gable Odonohue, MD  [facility] 04/04/2016, 10:05 AM  Pager #: [phone]  Time spent in minutes: more than 30 minutes   Discharge Exam: Filed Vitals:   04/04/16 0246 04/04/16 0949  BP: 130/82 125/84  Pulse: 91 87  Temp: 98.4 F (36.9 C) 97.9 F (36.6 C)  Resp: 18 20   Filed Vitals:   04/03/16 2233 04/04/16 0246 04/04/16 0428 04/04/16 0949  BP: 135/79 130/82  125/84  Pulse: 80 91  87  Temp: 98.8 F (37.1 C) 98.4 F (36.9 C)  97.9 F (36.6 C)  TempSrc: Oral Oral  Oral  Resp: 18 18  20   Height:      Weight:   58 kg (127 lb 13.9 oz)   SpO2: 98% 98%  98%    General: Pt is not in acute distress Cardiovascular: Regular rate and rhythm, S1/S2 +, no murmurs Respiratory: Clear to auscultation bilaterally, no wheezing, no crackles, no rhonchi Abdominal: Soft, non tender, non distended, bowel sounds +, no guarding Extremities: no edema, no cyanosis, pulses palpable bilaterally DP and PT Neuro: Grossly nonfocal  Discharge Instructions  Discharge Instructions    Call MD for:  difficulty breathing, headache or visual disturbances    Complete by:  As directed      Call MD for:  persistant nausea and vomiting    Complete by:  As directed      Call MD for:  severe uncontrolled pain    Complete by:  As directed      Diet general    Complete by:  As directed   Dysphagia 1     Discharge instructions    Complete by:  As directed   Stop date for Rocephin is 04/29/2016 Patient must see infectious disease before stopping Rocephin     Increase activity slowly    Complete by:  As directed             Medication List    STOP taking these medications        amiodarone 200 MG tablet  Commonly known as:  PACERONE     aspirin 81 MG chewable tablet  Replaced by:  aspirin 325 MG tablet     pantoprazole 40 MG tablet  Commonly known as:  PROTONIX     traMADol 50 MG tablet  Commonly known as:  ULTRAM       TAKE these medications        acetaminophen 325 MG tablet  Commonly known as:  TYLENOL  Take 650 mg by mouth every 6 (six) hours as needed (pain/fever).     albuterol (2.5 MG/3ML) 0.083% nebulizer solution  Commonly known as:  PROVENTIL  Take 3 mLs (2.5 mg total) by nebulization every 3 (three) hours as needed for wheezing.     aspirin 325 MG tablet  Place 1 tablet (325 mg total) into feeding tube daily.     atorvastatin 20 MG tablet  Commonly known as:  LIPITOR  Take 1 tablet (20 mg total) by mouth daily at 6 PM.     carvedilol 3.125 MG tablet  Commonly known as:  COREG  Place 1 tablet (3.125 mg total) into feeding tube  2 (two) times daily with a meal.     cefTRIAXone 2 g in dextrose 5 % 50 mL  Inject 2 g into the vein every 12 (twelve) hours.     famotidine 40 MG/5ML suspension  Commonly known as:  PEPCID  Place 2.5 mLs (20 mg total) into feeding tube daily.     feeding supplement (PRO-STAT SUGAR FREE 64) Liqd  Take 30 mLs by mouth 3 (three) times daily with meals.     folic acid 1 MG tablet  Commonly known as:  FOLVITE  Place 1 tablet (1 mg total) into feeding tube daily.     levETIRAcetam 100 MG/ML solution  Commonly known as:  KEPPRA  Place 5 mLs (500 mg total) into feeding tube 2 (two) times daily.     NUTRITIONAL SUPPLEMENT PO  Take 1 each by mouth 3 (three) times daily. 2 calorie milk shake     feeding supplement (ENSURE ENLIVE) Liqd  Take 237 mLs by mouth every 6 (six) hours.     predniSONE 5 MG tablet  Commonly known as:  DELTASONE  Place 1 tablet (5 mg total) into feeding tube daily with breakfast.     RESOURCE THICKENUP CLEAR Powd  Take 120 g by mouth as needed.     tamsulosin 0.4 MG Caps capsule  Commonly known as:  FLOMAX  Take 1 capsule (0.4 mg total) by mouth daily.     thiamine 100 MG tablet  Place 1 tablet (100 mg total) into feeding tube daily.     valproic acid 250 MG/5ML syrup  Commonly known as:  DEPAKENE  Place 5 mLs (250 mg total)  into feeding tube every 8 (eight) hours.          The results of significant diagnostics from this hospitalization (including imaging, microbiology, ancillary and laboratory) are listed below for reference.    Significant Diagnostic Studies: Ct Angio Head W/cm &/or Wo Cm 03/22/2016   Circle of Willis patent without focal stenosis or branch occlusion. Anterior and middle cerebral arteries show diffuse narrowing which may be due to basilar meningitis or vasospasm. This is a subjective findings and is diffuse. Negative CTA neck Subtle enhancement in the left basal ganglia and right parietal cortex corresponding to areas of abnormal enhancement on the MRI. 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 Electronically Signed   By: Marlan Palau M.D.   On: 03/22/2016 12:24   Dg Chest 1 View 03/05/2016   Stable support apparatus. Improved bibasilar opacities consistent with improving edema. Electronically Signed   By: Lupita Raider, M.D.   On: 03/05/2016 11:58   Ct Head Wo Contrast 03/20/2016   Interval development of hypodensities within the left-greater-than-right basal ganglia which may represent subacute infarcts. No acute cervical spine fracture. Electronically Signed   By: Annia Belt M.D.   On: 03/20/2016 19:15   Ct Angio Neck W/cm &/or Wo/cm 03/22/2016   Circle of Willis patent without focal stenosis or branch occlusion. Anterior and middle cerebral arteries show diffuse narrowing which may be due to basilar meningitis or vasospasm. This is a subjective findings and is diffuse. Negative CTA neck Subtle enhancement in the left basal ganglia and right parietal cortex corresponding to areas of abnormal enhancement on the MRI. 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 Electronically Signed   By: Marlan Palau M.D.  On: 03/22/2016 12:24   Ct Cervical Spine Wo Contrast 03/20/2016   Interval development of hypodensities within the left-greater-than-right basal ganglia which may represent subacute infarcts. No acute cervical spine fracture. Electronically Signed   By: Annia Belt M.D.   On: 03/20/2016 19:15   Mr Brain Wo Contrast 03/24/2016   New area of acute infarct in the left corona radiata. Multiple areas of restricted diffusion in the basal ganglia left greater than right show mild interval improvement since the prior MRI suggesting late subacute infarct. Improvement in restricted diffusion in the right parietal cortex. Small amount of hemorrhage in the left external capsule. Mild amount of intraventricular hemorrhage. These are unchanged from the MRI of 03/21/2016 although were not appreciated on the prior study due to motion. These results were called by telephone at the time of interpretation on 03/24/2016 at 1:36 pm to [facility] PA , who verbally acknowledged these results. Electronically Signed   By: Marlan Palau M.D.   On: 03/24/2016 13:37   Mr Laqueta Jean WJ Contrast 03/21/2016  Image quality degraded by significant motion. There is extensive abnormal enhancement in the basal ganglia bilaterally. Some of these areas show restricted diffusion left basal ganglia. The enhancement pattern and vascular territory suggest the enhancement is related to subacute infarct related to meningitis 1 month ago. Similar area of enhancement and restricted diffusion in the right parietal cortex. No intracranial hemorrhage. Electronically Signed   By: Marlan Palau M.D.   On: 03/21/2016 19:06   Dg Chest Portable 1 View 03/20/2016   No active disease. Electronically Signed   By: Alcide Clever M.D.   On: 03/20/2016 18:16   Dg Abd Portable 1v 03/29/2016  Distal tip of feeding tube seen in expected position of proximal duodenum. No evidence of bowel obstruction or ileus. Electronically Signed   By: Lupita Raider, M.D.    On: 03/29/2016 12:44   Dg Abd Portable 1v 03/25/2016  Plain film of the upper abdomen demonstrates weighted metallic tip feeding tube terminating within the stomach, with the tip terminating towards the cardia. Signed, Yvone Neu. Loreta Ave, DO [facility] Electronically Signed   By: Gilmer Mor D.O.   On: 03/25/2016 11:44   Dg Abd Portable 1v 03/23/2016   Tip of the weighted enteric tube is post pyloric in the region of the third portion of the duodenum. Electronically Signed   By: Rubye Oaks M.D.   On: 03/23/2016 19:53   Dg Swallowing Func-speech Pathology 03/31/2016 [facility] IP DIET RECOMMENDATION 03/31/2016 SLP Diet Recommendations Dysphagia 1 (Puree) solids;Nectar thick liquid Liquid Administration via Cup;Straw Medication Administration Crushed with puree Compensations Slow rate;Small sips/bites Postural Changes   Dg Swallowing Func-speech Pathology 03/07/2016   RECOMMENDATION 03/07/2016 SLP Diet Recommendations Dysphagia 1 (Puree) solids;Nectar thick liquid Liquid Administration via Cup;No straw Medication Administration Crushed with puree             Microbiology: No results found for this or any previous visit (from the past 240 hour(s)).   Labs: Basic Metabolic Panel:  Recent Labs Lab 03/29/16 0425 03/31/16 0716 04/01/16 0500 04/01/16 0700 04/02/16 0507 04/03/16 0905  NA 142 142 141  --  143 144  K 4.6 4.2 4.0  --  4.1 3.9  CL 101 103 102  --  106 103  CO2 --  28 30  GLUCOSE 106* 87 102*  --  106* 96  BUN 24* 27* 26*  --  22* 17  CREATININE 1.21 1.07 1.07  --  1.06 0.87  CALCIUM 8.1* 8.3* 8.5*  --  8.3* 8.7*  MG 2.1  --   --  2.0  --  1.8  PHOS  --   --   --  4.4  --   --    Liver Function Tests: No results for input(s): AST, ALT, ALKPHOS, BILITOT, PROT, ALBUMIN in the last 168 hours. No results for input(s): LIPASE, AMYLASE in the last 168 hours. No results for input(s): AMMONIA in the last 168  hours. CBC:  Recent Labs Lab 03/29/16 0425 03/31/16 0716 04/01/16 0500 04/02/16 0507 04/03/16 0905  WBC 15.0* 16.7* 14.0* 13.4* 7.1  HGB 10.4* 10.4* 10.4* 9.7* 10.0*  HCT 31.2* 30.8* 31.5* 28.7* 29.5*  MCV 86.0 84.6 86.3 88.3 88.9  PLT 181 195 209 208 199   Cardiac Enzymes: No results for input(s): CKTOTAL, CKMB, CKMBINDEX, TROPONINI in the last 168 hours. BNP: BNP (last 3 results)  Recent Labs  03/20/16 1800  BNP 23.8    ProBNP (last 3 results) No results for input(s): PROBNP in the last 8760 hours.  CBG:  Recent Labs Lab 04/03/16 0628 04/03/16 1135 04/03/16 1607 04/04/16 0015 04/04/16 0701  GLUCAP 94 88 96 89 78

## 2016-04-05 LAB — GLUCOSE, CAPILLARY
GLUCOSE-CAPILLARY: 98 mg/dL (ref 65–99)
GLUCOSE-CAPILLARY: 95 mg/dL (ref 65–99)
Glucose-Capillary: 79 mg/dL (ref 65–99)
Glucose-Capillary: 95 mg/dL (ref 65–99)

## 2016-04-05 MED ORDER — SODIUM CHLORIDE 0.9% FLUSH: 10.0000 mL | INTRAVENOUS | Status: DC | PRN

## 2016-04-05 MED ORDER — DEXTROSE 5 % IV SOLN: 2.0000 g | Freq: Two times a day (BID) | INTRAVENOUS | Status: DC

## 2016-04-05 MED ORDER — LORAZEPAM 2 MG/ML IJ SOLN
1.0000 mg | Freq: Once | INTRAMUSCULAR | Status: AC
  Administered 2016-04-05: 1 mg via INTRAVENOUS
  Filled 2016-04-05: qty 1

## 2016-04-05 NOTE — Progress Notes (Signed)
Interventional radiology unable to place PICC line today. Spoke with Dr. Elisabeth Pigeonevine. Order IV Ativan 1mg . Given IV. PICC team to attempt bedside placement. Dale RadarHeather M Alaisa Moffitt

## 2016-04-05 NOTE — Progress Notes (Signed)
IV team able to place PICC line at bedside. Will continue attempt to discharge today with family. Lawson RadarHeather M Krystal Delduca

## 2016-04-05 NOTE — Progress Notes (Signed)
Pt discharged home with brother, [name]. IV discontinued. Discharge instructions reviewed with patients brother. Prescriptions given to brother. Pt transported to car in wheelchair by nurse tech.

## 2016-04-05 NOTE — Progress Notes (Addendum)
Pt medically stable for discharge. Please refer to discharge summary done 04/04/2016. No changes in medical management since 04/04/2016.  Dale Passeylma Denarius Sesler  [facility] [phone]   Addendum Pt family wants to take pt home [facility]  [place] Chantal [name] Dale [name]

## 2016-04-05 NOTE — Progress Notes (Signed)
Peripherally Inserted Central Catheter/Midline Placement  The IV Nurse has discussed with the patient and/or persons authorized to consent for the patient, the purpose of this procedure and the potential benefits and risks involved with this procedure.  The benefits include less needle sticks, lab draws from the catheter and patient may be discharged home with the catheter.  Risks include, but not limited to, infection, bleeding, blood clot (thrombus formation), and puncture of an artery; nerve damage and irregular heat beat.  Alternatives to this procedure were also discussed.  PICC/Midline Placement Documentation  PICC Single Lumen 04/05/16 PICC Right Brachial 36 cm 0 cm (Active)  Indication for Insertion or Continuance of Line Home intravenous therapies (PICC only) 04/05/2016  5:00 PM  Exposed Catheter (cm) 0 cm 04/05/2016  5:00 PM  Dressing Change Due 04/12/16 04/05/2016  5:00 PM      Dale [name], Dale [name] 04/05/2016, 5:22 PM

## 2016-04-05 NOTE — Evaluation (Addendum)
Physical Therapy Evaluation Patient Details Name: Dale [name] MRN: [id number] DOB: 1966-03-18 Today's Date: 04/05/2016   History of Present Illness  HPI: 50 y.o. male with h/o depression recently in hospital from 3/4-4/6 after for meningitis. During that hospital admit, pt suffered meningitis, suffered cardiac arrest, cardiogenic shock with acute systolic CHF with trach placed 3/24 and pt was found to have dysphagia. Pt returned 4/16 with hypothermia- severe sepsis. Left gaze preference, basal ganglia abnormality, left corona radiata cva per notes. Pt removed his tube for feeding last pm and swallow eval ordered. MBS indicated due to h/o dysphagia and possible worsening with acute issues.  Clinical Impression  Patient presents with decreased mobility due to deficits listed in PT problem list below.  He will benefit from skilled PT in the acute setting to facilitate maximal safety and independence prior to d/c to SNF level rehab.      Follow Up Recommendations SNF    Equipment Recommendations  None recommended by PT (to be determined at next venue)    Recommendations for Other Services       Precautions / Restrictions Precautions Precautions: Fall Restrictions Weight Bearing Restrictions: No      Mobility  Bed Mobility               General bed mobility comments: up in chair  Transfers Overall transfer level: Needs assistance Equipment used: 1 person hand held assist Transfers: Sit to/from Stand Sit to Stand: Mod assist         General transfer comment: increased time and lifting assist to stand due to poor trunk strength/coordination with anterior weight shift and foot placement  Ambulation/Gait Ambulation/Gait assistance: Mod assist Ambulation Distance (Feet): 130 Feet Assistive device: 1 person hand held assist Gait Pattern/deviations: Shuffle;Ataxic;Scissoring;Trunk flexed;Narrow base of support;Drifts right/left;Step-through pattern;Decreased stride  length     General Gait Details: cues and assist for direction, poor coordination with stepping noted ataxic gait and poor balance needing mod support with hip to hip and HHA for guidance, stability  Stairs            Wheelchair Mobility    Modified Rankin (Stroke Patients Only) Modified Rankin (Stroke Patients Only) Pre-Morbid Rankin Score: Moderately severe disability Modified Rankin: Moderately severe disability     Balance Overall balance assessment: Needs assistance Sitting-balance support: Bilateral upper extremity supported Sitting balance-Leahy Scale: Fair Sitting balance - Comments: can sit with supervision with UE support on toilet, EOB with supervision and feet supported   Standing balance support: Single extremity supported Standing balance-Leahy Scale: Poor Standing balance comment: mod/max A when standing without UE support  during activities such as toilet hygiene and hand washing                             Pertinent Vitals/Pain Faces Pain Scale: No hurt    Home Living Family/patient expects to be discharged to:: Skilled nursing facility                 Additional Comments: Pt is homeless according to chart. No family present to provide history or PLOF    Prior Function           Comments: PLOF is unknown, no family present.      Hand Dominance        Extremity/Trunk Assessment   Upper Extremity Assessment: RUE deficits/detail;LUE deficits/detail RUE Deficits / Details: AAROM WFL, strength grossly 3+/5 decreased coordination with function, some ataxic movements noted  LUE Deficits / Details: AAROM WFL, strength grossly 3+/5 decreased coordination with function, some ataxic movements noted   Lower Extremity Assessment: LLE deficits/detail;RLE deficits/detail RLE Deficits / Details: AROM WFL, strength grossly 3/5 LLE Deficits / Details: AROM WFL, strength grossly 3/5     Communication   Communication: Expressive  difficulties;Receptive difficulties (some appropriate verbalizations and follows 1 step commands with increased time)  Cognition Arousal/Alertness: Awake/alert Behavior During Therapy: Restless Overall Cognitive Status: No family/caregiver present to determine baseline cognitive functioning Area of Impairment: Attention;Following commands;Problem solving;Safety/judgement       Following Commands: Follows one step commands with increased time Safety/Judgement: Decreased awareness of safety;Decreased awareness of deficits   Problem Solving: Slow processing;Decreased initiation;Difficulty sequencing;Requires verbal cues      General Comments General comments (skin integrity, edema, etc.): seated in recliner upon my entry and easily aroused.  Once word answers to questions like "fine", "yes", "move this"; noted dyscongugate gaze and difficulty looking to R    Exercises        Assessment/Plan    PT Assessment Patient needs continued PT services  PT Diagnosis Abnormality of gait;Generalized weakness;Altered mental status   PT Problem List Decreased strength;Decreased activity tolerance;Decreased balance;Decreased coordination;Decreased mobility;Decreased knowledge of precautions;Decreased safety awareness;Decreased knowledge of use of DME;Decreased cognition  PT Treatment Interventions DME instruction;Gait training;Functional mobility training;Patient/family education;Therapeutic activities;Therapeutic exercise;Neuromuscular re-education;Balance training   PT Goals (Current goals can be found in the Care Plan section) Acute Rehab PT Goals Patient Stated Goal: none stated PT Goal Formulation: Patient unable to participate in goal setting Time For Goal Achievement: 04/12/16 Potential to Achieve Goals: Fair    Frequency Min 3X/week   Barriers to discharge        Co-evaluation               End of Session Equipment Utilized During Treatment: Gait belt Activity Tolerance:  Patient tolerated treatment well Patient left: in chair;with call bell/phone within reach;with chair alarm set           Time: 1050-1115 PT Time Calculation (min) (ACUTE ONLY): 25 min   Charges:   PT Evaluation $PT Eval Moderate Complexity: 1 Procedure PT Treatments $Gait Training: 8-22 mins   PT G CodesElray [name]:        Cynthia Wynn 04/05/2016, 11:35 AM Sheran Lawlessyndi Wynn, PT [phone] 04/05/2016

## 2016-04-05 NOTE — Care Management Note (Addendum)
Case Management Note  Patient Details  Name: Dale [name] MRN: [id number] Date of Birth: 09/30/1966  Subjective/Objective:                    Action/Plan: Per CSW plan is for patient to [facility] to [name]. CM called [name] [phone] and went over patient having 24 hr care in the home. [name] states that he and his wife can provide the care and they have nieces and nephews that are going to be helping. CM offered a private duty care list but [name] states he feels the family has enough people to assist. Since the patient has no insurance CM contacted Jermaine with [facility] and informed him of the DME needs and that patient would be discharging today to his brothers home.  CM also notified Lupita [facility]ashDonna [facility] needed and that patient may be a charity case. CM also informed Pam with [facility] IV therapy that patient will be going home and will need a few weeks of IV therapy. Pt does not have a PCP. CM was able to obtain him an [facility]. Appointment placed on AVS and CM will inform [name] about [facility]. CM will continue to follow.  Expected Discharge Date:                  Expected Discharge Plan:  Skilled Nursing Facility  In-House Referral:  Clinical Social Work  Discharge planning Services  CM Consult  Post Acute Care Choice:  Durable Medical Equipment, Home Health Choice offered to:     DME Arranged:  Hospital bed, Wheelchair manual DME Agency:  [facility]  HH Arranged:  RN, PT, OT, Nurse's Aide, Social Work [employer] Agency:  [facility]  Status of Service:  Completed, signed off  Medicare Important Message Given:    Date Medicare IM Given:    Medicare IM give by:    Date Additional Medicare IM Given:    Additional Medicare Important Message give by:     If discussed at Long Length of Stay Meetings, dates discussed:    Additional  Comments:  Dale BaloKelli F Christina Gintz, RN 04/05/2016, 2:04 PM

## 2016-04-05 NOTE — Progress Notes (Signed)
Spoke with Herbert SetaHeather, RN bedside nurse for this patient regarding PICC placement.  During the two conversations she had to leave to go to patient bedside due to patient getting out of bed.  Currently patient is too restless to do PICC at bedside and maintain sterile field.  I requested that MD be notified of need for sedation versus IR to place line.  Heather, RN stated that she will have order for IR to place PICC as patient will not be able to be sedated and monitored properly on the floor.  Gasper LloydKerry Catha Ontko, RN VAST

## 2016-04-05 NOTE — Progress Notes (Signed)
CSW spoke with patient's brother, Arloa Koharl Napierala ([phone]). He has conferred with the rest of his family and would like to bring patient home with him. He expressed understanding that it would take a lot of care and supervision and that his "girl" is going to school to work as a Engineer, civil (consulting)nurse and can help him. He stated he would like to speak with case management about home equipment and supplies. CSW provided RNCM with his number.   Patient's brother stated he would come pick patient up from the hospital.  CSW signing off.  Osborne Cascoadia Lilleigh Hechavarria LCSWA [phone]

## 2016-04-16 ENCOUNTER — Emergency Department (HOSPITAL_COMMUNITY)
Admission: EM | Admit: 2016-04-16 | Discharge: 2016-04-16 | Disposition: A | Discharge status: Home / Self Care | Payer: Medicaid Other | Attending: Emergency Medicine | Admitting: Emergency Medicine

## 2016-04-16 ENCOUNTER — Encounter (HOSPITAL_COMMUNITY): Payer: Self-pay | Admitting: Emergency Medicine

## 2016-04-16 DIAGNOSIS — Z7952 Long term (current) use of systemic steroids: Secondary | ICD-10-CM | POA: Diagnosis not present

## 2016-04-16 DIAGNOSIS — Z8661 Personal history of infections of the central nervous system: Secondary | ICD-10-CM | POA: Diagnosis not present

## 2016-04-16 DIAGNOSIS — Z8709 Personal history of other diseases of the respiratory system: Secondary | ICD-10-CM | POA: Insufficient documentation

## 2016-04-16 DIAGNOSIS — F329 Major depressive disorder, single episode, unspecified: Secondary | ICD-10-CM | POA: Diagnosis not present

## 2016-04-16 DIAGNOSIS — Z7982 Long term (current) use of aspirin: Secondary | ICD-10-CM | POA: Insufficient documentation

## 2016-04-16 DIAGNOSIS — Z79899 Other long term (current) drug therapy: Secondary | ICD-10-CM | POA: Insufficient documentation

## 2016-04-16 DIAGNOSIS — Z8619 Personal history of other infectious and parasitic diseases: Secondary | ICD-10-CM | POA: Diagnosis not present

## 2016-04-16 DIAGNOSIS — Z8674 Personal history of sudden cardiac arrest: Secondary | ICD-10-CM | POA: Insufficient documentation

## 2016-04-16 DIAGNOSIS — F17210 Nicotine dependence, cigarettes, uncomplicated: Secondary | ICD-10-CM | POA: Insufficient documentation

## 2016-04-16 DIAGNOSIS — Z452 Encounter for adjustment and management of vascular access device: Secondary | ICD-10-CM | POA: Diagnosis not present

## 2016-04-16 MED ORDER — ALTEPLASE 100 MG IV SOLR: 2.0000 mg | Freq: Once | INTRAVENOUS | Status: DC

## 2016-04-16 NOTE — ED Notes (Signed)
Family now at bedside, EDP aware.

## 2016-04-16 NOTE — ED Notes (Signed)
Pt verbalized understanding of d/c instructionsand follow-up care. No further questions/concerns, VSS, assisted to lobby in wheelchair. 

## 2016-04-16 NOTE — ED Notes (Signed)
Pt here for problem with PICC line. Pt has hx of stroke 3 weeks ago and spinal meningitis 1 month ago. Pt is weak on right side. Toa=day, brother was attempting to give medication and PICC like would no flush. Brother also sts blood was coming out of the site.

## 2016-04-16 NOTE — ED Notes (Signed)
Pt's PICC line has good blood return and flushes with no difficulty.  Family educated.

## 2016-04-16 NOTE — ED Provider Notes (Signed)
CSN: [id number]     Arrival date & time 04/16/16  1332 History   First MD Initiated Contact with Patient 04/16/16 1358     Chief Complaint  Patient presents with  . Vascular Access Problem     (Consider location/radiation/quality/duration/timing/severity/associated sxs/prior Treatment) HPI Patient has PICC line placement for antibiotics for meningitis. Family member was having difficulty flushing the PICC line today. No other complaints at this time. Past Medical History  Diagnosis Date  . Depression   . Cardiac arrest (HCC)   . Cardiogenic shock (HCC)   . Acute respiratory failure with hypoxia (HCC)   . Meningitis, streptococcal 02/16/2016  . 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 Tobacco abuse 02/16/2016  . Substance abuse   . ETOH abuse   . Septic shock [facility](HCC)    Past Surgical History  Procedure Laterality Date  . Ankle surgery    . Cardiac catheterization N/A 02/17/2016    Procedure: IABP Insertion;  Surgeon: Iran OuchMuhammad A Arida, MD;  [facility] INVASIVE CV LAB;  Service: Cardiovascular;  Laterality: N/A;  . Tee without cardioversion N/A 03/29/2016    Procedure: TRANSESOPHAGEAL ECHOCARDIOGRAM (TEE);  Surgeon: Laurey Moralealton S McLean, MD;  [facility];  Service: Cardiovascular;  Laterality: N/A;   History reviewed. No pertinent family history. Social History  Substance Use Topics  . Smoking status: Current Every Day Smoker -- 0.10 packs/day    Types: Cigarettes  . Smokeless tobacco: None  . Alcohol Use: Yes    Review of Systems  Constitutional: Negative for fever and chills.  Respiratory: Negative for shortness of breath.   Cardiovascular: Negative for chest pain.  Gastrointestinal: Negative for abdominal pain.  Musculoskeletal: Negative for back pain, neck pain and neck stiffness.  Skin: Negative for rash and wound.  All other systems reviewed and are negative.     Allergies  Review of patient's allergies indicates no known allergies.  Home Medications    Prior to Admission medications   Medication Sig Start Date End Date Taking? Authorizing Provider  acetaminophen (TYLENOL) 325 MG tablet Take 650 mg by mouth every 6 (six) hours as needed (pain/fever).     Historical Provider, MD  albuterol (PROVENTIL) (2.5 MG/3ML) 0.083% nebulizer solution Take 3 mLs (2.5 mg total) by nebulization every 3 (three) hours as needed for wheezing. Patient taking differently: Take 2.5 mg by nebulization every 3 (three) hours as needed for wheezing or shortness of breath.  03/10/16   Richarda OverlieNayana Abrol, MD  Amino Acids-Protein Hydrolys (FEEDING SUPPLEMENT, PRO-STAT SUGAR FREE 64,) LIQD Take 30 mLs by mouth 3 (three) times daily with meals. 04/04/16   Alison MurrayAlma M Devine, MD  aspirin 325 MG tablet Place 1 tablet (325 mg total) into feeding tube daily. 04/04/16   Alison MurrayAlma M Devine, MD  atorvastatin (LIPITOR) 20 MG tablet Take 1 tablet (20 mg total) by mouth daily at 6 PM. 04/04/16   Alison MurrayAlma M Devine, MD  carvedilol (COREG) 3.125 MG tablet Place 1 tablet (3.125 mg total) into feeding tube 2 (two) times daily with a meal. 04/04/16   Alison MurrayAlma M Devine, MD  cefTRIAXone 2 g in dextrose 5 % 50 mL Inject 2 g into the vein every 12 (twelve) hours. 04/05/16 04/30/16  Alison MurrayAlma M Devine, MD  famotidine (PEPCID) 40 MG/5ML suspension Place 2.5 mLs (20 mg total) into feeding tube daily. 04/04/16   Alison MurrayAlma M Devine, MD  feeding supplement, ENSURE ENLIVE, (ENSURE ENLIVE) LIQD Take 237 mLs by mouth every 6 (six) hours. 04/04/16   Alison MurrayAlma M Devine,  MD  folic acid (FOLVITE) 1 MG tablet Place 1 tablet (1 mg total) into feeding tube daily. 04/04/16   Alison Murray, MD  levETIRAcetam (KEPPRA) 100 MG/ML solution Place 5 mLs (500 mg total) into feeding tube 2 (two) times daily. 04/04/16   Alison Murray, MD  Maltodextrin-Xanthan Gum (RESOURCE THICKENUP CLEAR) POWD Take 120 g by mouth as needed. 04/04/16   Alison Murray, MD  Nutritional Supplements (NUTRITIONAL SUPPLEMENT PO) Take 1 each by mouth 3 (three) times daily. 2 calorie milk shake    Historical  Provider, MD  predniSONE (DELTASONE) 5 MG tablet Place 1 tablet (5 mg total) into feeding tube daily with breakfast. 04/04/16   Alison Murray, MD  tamsulosin (FLOMAX) 0.4 MG CAPS capsule Take 1 capsule (0.4 mg total) by mouth daily. 04/04/16   Alison Murray, MD  thiamine 100 MG tablet Place 1 tablet (100 mg total) into feeding tube daily. 04/04/16   Alison Murray, MD  valproic acid (DEPAKENE) 250 MG/5ML syrup Place 5 mLs (250 mg total) into feeding tube every 8 (eight) hours. 04/04/16   Alison Murray, MD   BP 99/77 mmHg  Pulse 63  Temp(Src) 97.5 F (36.4 C) (Oral)  Resp 16  SpO2 100% Physical Exam  Constitutional: He is oriented to person, place, and time. He appears well-developed and well-nourished.  Chronically ill-appearing  HENT:  Head: Normocephalic and atraumatic.  Mouth/Throat: Oropharynx is clear and moist.  Eyes: EOM are normal. Pupils are equal, round, and reactive to light.  Neck: Normal range of motion. Neck supple.  No meningismus  Cardiovascular: Normal rate and regular rhythm.   Pulmonary/Chest: Effort normal and breath sounds normal. No respiratory distress. He has no wheezes. He has no rales. He exhibits no tenderness.  Abdominal: Soft. Bowel sounds are normal. He exhibits no distension. There is no tenderness. There is no rebound and no guarding.  Musculoskeletal: Normal range of motion. He exhibits no edema or tenderness.  No upper or lower extremity edema. Distal pulses are equal and intact. Patient has a right antecubital fossa PICC line. There is no tenderness, warmth or erythema at the site.  Neurological: He is alert and oriented to person, place, and time.  Skin: Skin is warm and dry. No rash noted. No erythema.  Psychiatric: He has a normal mood and affect. His behavior is normal.  Nursing note and vitals reviewed.   ED Course  Procedures (including critical care time) Labs Review Labs Reviewed - No data to display  Imaging Review No results found. I have  personally reviewed and evaluated these images and lab results as part of my medical decision-making.   EKG Interpretation None      MDM   Final diagnoses:  Encounter for adjustment or management of vascular access device    PICC line flushed in the emergency department. No bleeding or infection. Family given education on use of PICC line. Return precautions given.    Loren Racer, MD 04/16/16 1455

## 2016-04-16 NOTE — Discharge Instructions (Signed)
PICC Home Guide A peripherally inserted central catheter (PICC) is a long, thin, flexible tube that is inserted into a vein in the upper arm. It is a form of intravenous (IV) access. It is considered to be a "central" line because the tip of the PICC ends in a large vein in your chest. This large vein is called the superior vena cava (SVC). The PICC tip ends in the SVC because there is a lot of blood flow in the SVC. This allows medicines and IV fluids to be quickly distributed throughout the body. The PICC is inserted using a sterile technique by a specially trained nurse or physician. After the PICC is inserted, a chest X-ray exam is done to be sure it is in the correct place.  A PICC may be placed for different reasons, such as:  To give medicines and liquid nutrition that can only be given through a central line. Examples are:  Certain antibiotic treatments.  Chemotherapy.  Total parenteral nutrition (TPN).  To take frequent blood samples.  To give IV fluids and blood products.  If there is difficulty placing a peripheral intravenous (PIV) catheter. If taken care of properly, a PICC can remain in place for several months. A PICC can also allow a person to go home from the hospital early. Medicine and PICC care can be managed at home by a family member or home health care team. WHAT PROBLEMS CAN HAPPEN WHEN I HAVE A PICC? Problems with a PICC can occasionally occur. These may include the following:  A blood clot (thrombus) forming in or at the tip of the PICC. This can cause the PICC to become clogged. A clot-dissolving medicine called tissue plasminogen activator (tPA) can be given through the PICC to help break up the clot.  Inflammation of the vein (phlebitis) in which the PICC is placed. Signs of inflammation may include redness, pain at the insertion site, red streaks, or being able to feel a "cord" in the vein where the PICC is located.  Infection in the PICC or at the insertion  site. Signs of infection may include fever, chills, redness, swelling, or pus drainage from the PICC insertion site.  PICC movement (malposition). The PICC tip may move from its original position due to excessive physical activity, forceful coughing, sneezing, or vomiting.  A break or cut in the PICC. It is important to not use scissors near the PICC.  Nerve or tendon irritation or injury during PICC insertion. WHAT SHOULD I KEEP IN MIND ABOUT ACTIVITIES WHEN I HAVE A PICC?  You may bend your arm and move it freely. If your PICC is near or at the bend of your elbow, avoid activity with repeated motion at the elbow.  Rest at home for the remainder of the day following PICC line insertion.  Avoid lifting heavy objects as instructed by your health care provider.  Avoid using a crutch with the arm on the same side as your PICC. You may need to use a walker. WHAT SHOULD I KNOW ABOUT MY PICC DRESSING?  Keep your PICC bandage (dressing) clean and dry to prevent infection.  Ask your health care provider when you may shower. Ask your health care provider to teach you how to wrap the PICC when you do take a shower.  Change the PICC dressing as instructed by your health care provider.  Change your PICC dressing if it becomes loose or wet. WHAT SHOULD I KNOW ABOUT PICC CARE?  Check the PICC insertion site   daily for leakage, redness, swelling, or pain.  Do not take a bath, swim, or use hot tubs when you have a PICC. Cover PICC line with clear plastic wrap and tape to keep it dry while showering.  Flush the PICC as directed by your health care provider. Let your health care provider know right away if the PICC is difficult to flush or does not flush. Do not use force to flush the PICC.  Do not use a syringe that is less than 10 mL to flush the PICC.  Never pull or tug on the PICC.  Avoid blood pressure checks on the arm with the PICC.  Keep your PICC identification card with you at all  times.  Do not take the PICC out yourself. Only a trained clinical professional should remove the PICC. SEEK IMMEDIATE MEDICAL CARE IF:  Your PICC is accidentally pulled all the way out. If this happens, cover the insertion site with a bandage or gauze dressing. Do not throw the PICC away. Your health care provider will need to inspect it.  Your PICC was tugged or pulled and has partially come out. Do not  push the PICC back in.  There is any type of drainage, redness, or swelling where the PICC enters the skin.  You cannot flush the PICC, it is difficult to flush, or the PICC leaks around the insertion site when it is flushed.  You hear a "flushing" sound when the PICC is flushed.  You have pain, discomfort, or numbness in your arm, shoulder, or jaw on the same side as the PICC.  You feel your heart "racing" or skipping beats.  You notice a hole or tear in the PICC.  You develop chills or a fever. MAKE SURE YOU:   Understand these instructions.  Will watch your condition.  Will get help right away if you are not doing well or get worse.   This information is not intended to replace advice given to you by your health care provider. Make sure you discuss any questions you have with your health care provider.   Document Released: 05/28/2003 Document Revised: 12/12/2014 Document Reviewed: 07/29/2013 Elsevier Interactive Patient Education 2016 Elsevier Inc.  

## 2016-04-17 ENCOUNTER — Encounter (HOSPITAL_COMMUNITY): Payer: Self-pay

## 2016-04-17 ENCOUNTER — Emergency Department (HOSPITAL_COMMUNITY)
Admission: EM | Admit: 2016-04-17 | Discharge: 2016-04-17 | Disposition: A | Discharge status: Home / Self Care | Payer: Medicaid Other | Attending: Emergency Medicine | Admitting: Emergency Medicine

## 2016-04-17 DIAGNOSIS — F329 Major depressive disorder, single episode, unspecified: Secondary | ICD-10-CM | POA: Insufficient documentation

## 2016-04-17 DIAGNOSIS — Z8673 Personal history of transient ischemic attack (TIA), and cerebral infarction without residual deficits: Secondary | ICD-10-CM | POA: Insufficient documentation

## 2016-04-17 DIAGNOSIS — R410 Disorientation, unspecified: Secondary | ICD-10-CM

## 2016-04-17 DIAGNOSIS — F17210 Nicotine dependence, cigarettes, uncomplicated: Secondary | ICD-10-CM | POA: Diagnosis not present

## 2016-04-17 DIAGNOSIS — Z8619 Personal history of other infectious and parasitic diseases: Secondary | ICD-10-CM | POA: Insufficient documentation

## 2016-04-17 DIAGNOSIS — Z8709 Personal history of other diseases of the respiratory system: Secondary | ICD-10-CM | POA: Insufficient documentation

## 2016-04-17 DIAGNOSIS — Z79899 Other long term (current) drug therapy: Secondary | ICD-10-CM | POA: Diagnosis not present

## 2016-04-17 DIAGNOSIS — Z8669 Personal history of other diseases of the nervous system and sense organs: Secondary | ICD-10-CM | POA: Diagnosis not present

## 2016-04-17 DIAGNOSIS — R4182 Altered mental status, unspecified: Secondary | ICD-10-CM | POA: Diagnosis present

## 2016-04-17 LAB — CBC WITH DIFFERENTIAL/PLATELET
Basophils Absolute: 0 10*3/uL (ref 0.0–0.1)
Basophils Relative: 1 %
Eosinophils Absolute: 0.1 10*3/uL (ref 0.0–0.7)
Eosinophils Relative: 3 %
HCT: 29.3 % — ABNORMAL LOW (ref 39.0–52.0)
Hemoglobin: 9.4 g/dL — ABNORMAL LOW (ref 13.0–17.0)
Lymphocytes Relative: 62 %
Lymphs Abs: 2.6 10*3/uL (ref 0.7–4.0)
MCH: 29 pg (ref 26.0–34.0)
MCHC: 32.1 g/dL (ref 30.0–36.0)
MCV: 90.4 fL (ref 78.0–100.0)
Monocytes Absolute: 0.3 10*3/uL (ref 0.1–1.0)
Monocytes Relative: 8 %
Neutro Abs: 1.2 10*3/uL — ABNORMAL LOW (ref 1.7–7.7)
Neutrophils Relative %: 28 %
Platelets: 256 10*3/uL (ref 150–400)
RBC: 3.24 MIL/uL — ABNORMAL LOW (ref 4.22–5.81)
RDW: 14.9 % (ref 11.5–15.5)
WBC: 4.2 10*3/uL (ref 4.0–10.5)

## 2016-04-17 LAB — BASIC METABOLIC PANEL
Anion gap: 10 (ref 5–15)
BUN: 16 mg/dL (ref 6–20)
CO2: 29 mmol/L (ref 22–32)
Calcium: 9 mg/dL (ref 8.9–10.3)
Chloride: 105 mmol/L (ref 101–111)
Creatinine, Ser: 1.12 mg/dL (ref 0.61–1.24)
GFR calc Af Amer: >60 mL/min (ref >60)
GFR calc non Af Amer: >60 mL/min (ref >60)
Glucose, Bld: 90 mg/dL (ref 65–99)
Potassium: 4.3 mmol/L (ref 3.5–5.1)
Sodium: 144 mmol/L (ref 135–145)

## 2016-04-17 LAB — CBG MONITORING, ED: Glucose-Capillary: 87 mg/dL (ref 65–99)

## 2016-04-17 NOTE — ED Notes (Addendum)
Per EMS - pt was wandering in neighborhood wearing only shirt. [employer] was called. Pt walked to ambulance. Pt is alert to self, disoriented as to month/year/situation. Pt knows [place] in [place] but doesn't know where in [place]. [employer] went to pt's house and brother said they have been looking for him all morning. Pt told EMS that he is on dialysis; however, family states that pt is not on dialysis. Pt was recently admitted for spinal meningitis and then stroke. CBG 105.

## 2016-04-17 NOTE — ED Provider Notes (Signed)
CSN: [id number]     Arrival date & time 04/17/16  1037 History   None    No chief complaint on file.    (Consider location/radiation/quality/duration/timing/severity/associated sxs/prior Treatment) HPI   50 year old male brought in by EMS after he was found wandering the streets. Patient has a significant past medical history. Recent admission for sepsis secondary to meningitis. Placement was recommended, but family prefered to take patient home in their care. He was sent home with the PICC for continued antibiotics. They're currently at bedside. They report that patient is more or less at his new baseline. His brother? States that the patient wandered out of the house while he was in the shower.  Past Medical History  Diagnosis Date  . Depression   . Cardiac arrest (HCC)   . Cardiogenic shock (HCC)   . Acute respiratory failure with hypoxia (HCC)   . Meningitis, streptococcal 02/16/2016  . 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 Tobacco abuse 02/16/2016  . Substance abuse   . ETOH abuse   . Septic shock [facility])    Past Surgical History  Procedure Laterality Date  . Ankle surgery    . Cardiac catheterization N/A 02/17/2016    Procedure: IABP Insertion;  Surgeon: Iran Ouch, MD;  Location: [facility] INVASIVE CV LAB;  Service: Cardiovascular;  Laterality: N/A;  . Tee without cardioversion N/A 03/29/2016    Procedure: TRANSESOPHAGEAL ECHOCARDIOGRAM (TEE);  Surgeon: Laurey Morale, MD;  [facility] ENDOSCOPY;  Service: Cardiovascular;  Laterality: N/A;   History reviewed. No pertinent family history. Social History  Substance Use Topics  . Smoking status: Current Every Day Smoker -- 0.10 packs/day    Types: Cigarettes  . Smokeless tobacco: None  . Alcohol Use: Yes    Review of Systems   5 caveat because patient is encephalopathic. Allergies  Review of patient's allergies indicates no known allergies.  Home Medications   Prior to Admission medications   Medication Sig  Start Date End Date Taking? Authorizing Provider  acetaminophen (TYLENOL) 325 MG tablet Take 650 mg by mouth every 6 (six) hours as needed (pain/fever).     Historical Provider, MD  albuterol (PROVENTIL) (2.5 MG/3ML) 0.083% nebulizer solution Take 3 mLs (2.5 mg total) by nebulization every 3 (three) hours as needed for wheezing. Patient taking differently: Take 2.5 mg by nebulization every 3 (three) hours as needed for wheezing or shortness of breath.  03/10/16   Richarda Overlie, MD  Amino Acids-Protein Hydrolys (FEEDING SUPPLEMENT, PRO-STAT SUGAR FREE 64,) LIQD Take 30 mLs by mouth 3 (three) times daily with meals. 04/04/16   Alison Murray, MD  aspirin 325 MG tablet Place 1 tablet (325 mg total) into feeding tube daily. 04/04/16   Alison Murray, MD  atorvastatin (LIPITOR) 20 MG tablet Take 1 tablet (20 mg total) by mouth daily at 6 PM. 04/04/16   Alison Murray, MD  carvedilol (COREG) 3.125 MG tablet Place 1 tablet (3.125 mg total) into feeding tube 2 (two) times daily with a meal. 04/04/16   Alison Murray, MD  cefTRIAXone 2 g in dextrose 5 % 50 mL Inject 2 g into the vein every 12 (twelve) hours. 04/05/16 04/30/16  Alison Murray, MD  famotidine (PEPCID) 40 MG/5ML suspension Place 2.5 mLs (20 mg total) into feeding tube daily. 04/04/16   Alison Murray, MD  feeding supplement, ENSURE ENLIVE, (ENSURE ENLIVE) LIQD Take 237 mLs by mouth every 6 (six) hours. 04/04/16   Alison Murray, MD  folic  acid (FOLVITE) 1 MG tablet Place 1 tablet (1 mg total) into feeding tube daily. 04/04/16   Alison MurrayAlma M Devine, MD  levETIRAcetam (KEPPRA) 100 MG/ML solution Place 5 mLs (500 mg total) into feeding tube 2 (two) times daily. 04/04/16   Alison MurrayAlma M Devine, MD  Maltodextrin-Xanthan Gum (RESOURCE THICKENUP CLEAR) POWD Take 120 g by mouth as needed. 04/04/16   Alison MurrayAlma M Devine, MD  Nutritional Supplements (NUTRITIONAL SUPPLEMENT PO) Take 1 each by mouth 3 (three) times daily. 2 calorie milk shake    Historical Provider, MD  predniSONE (DELTASONE) 5 MG tablet  Place 1 tablet (5 mg total) into feeding tube daily with breakfast. 04/04/16   Alison MurrayAlma M Devine, MD  tamsulosin (FLOMAX) 0.4 MG CAPS capsule Take 1 capsule (0.4 mg total) by mouth daily. 04/04/16   Alison MurrayAlma M Devine, MD  thiamine 100 MG tablet Place 1 tablet (100 mg total) into feeding tube daily. 04/04/16   Alison MurrayAlma M Devine, MD  valproic acid (DEPAKENE) 250 MG/5ML syrup Place 5 mLs (250 mg total) into feeding tube every 8 (eight) hours. 04/04/16   Alison MurrayAlma M Devine, MD   There were no vitals taken for this visit. Physical Exam  Constitutional: No distress.  Frail, cachectic and chronically ill-appearing. Does not appear acutely distressed.  HENT:  Head: Normocephalic and atraumatic.  Eyes: Conjunctivae are normal. Right eye exhibits no discharge. Left eye exhibits no discharge.  Neck: Neck supple.  Cardiovascular: Normal rate, regular rhythm and normal heart sounds.  Exam reveals no gallop and no friction rub.   No murmur heard. PICC line right upper extremity with some dried blood at insertion. No erythema, purulence or other concerning skin changes.  Pulmonary/Chest: Effort normal and breath sounds normal. No respiratory distress.  Abdominal: Soft. He exhibits no distension. There is no tenderness.  Musculoskeletal: He exhibits no edema or tenderness.  Neurological: He is alert.  Sitting bed. Disoriented to place and time.  Skin: Skin is warm and dry.  Nursing note and vitals reviewed.   ED Course  Procedures (including critical care time) Labs Review Labs Reviewed  CBG MONITORING, ED    Imaging Review No results found. I have personally reviewed and evaluated these images and lab results as part of my medical decision-making.   EKG Interpretation None      MDM   Final diagnoses:  Confusion    50 year old male found wandering the streets. Significant medical issues with ongoing antibiotics through right upper extremity PICC. Family is at bedside. They report that they would like to take  him home. They feel comfortable continuing his care at home. They state that he wandered off after he slept alone for short period of time. They're not interested in possibly pursuing placement. He appears to be at his baseline. He is afebrile. Patient actually labs unremarkable. He will be discharged back in the care per their request.  Raeford RazorStephen Hubert Derstine, MD 04/27/16 2154

## 2016-04-17 NOTE — Discharge Instructions (Signed)
Confusion Confusion is the inability to think with your usual speed or clarity. Confusion may come on quickly or slowly over time. How quickly the confusion comes on depends on the cause. Confusion can be due to any number of causes. CAUSES   Concussion, head injury, or head trauma.  Seizures.  Stroke.  Fever.  Brain tumor.  Age related decreased brain function (dementia).  Heightened emotional states like rage or terror.  Mental illness in which the person loses the ability to determine what is real and what is not (hallucinations).  Infections such as a urinary tract infection (UTI).  Toxic effects from alcohol, drugs, or prescription medicines.  Dehydration and an imbalance of salts in the body (electrolytes).  Lack of sleep.  Low blood sugar (diabetes).  Low levels of oxygen from conditions such as chronic lung disorders.  Drug interactions or other medicine side effects.  Nutritional deficiencies, especially niacin, thiamine, vitamin C, or vitamin B.  Sudden drop in body temperature (hypothermia).  Change in routine, such as when traveling or hospitalized. SIGNS AND SYMPTOMS  People often describe their thinking as cloudy or unclear when they are confused. Confusion can also include feeling disoriented. That means you are unaware of where or who you are. You may also not know what the date or time is. If confused, you may also have difficulty paying attention, remembering, and making decisions. Some people also act aggressively when they are confused.  DIAGNOSIS  The medical evaluation of confusion may include:  Blood and urine tests.  X-rays.  Brain and nervous system tests.  Analyzing your brain waves (electroencephalogram or EEG).  Magnetic resonance imaging (MRI) of your head.  Computed tomography (CT) scan of your head.  Mental status tests in which your health care provider may ask many questions. Some of these questions may seem silly or strange,  but they are a very important test to help diagnose and treat confusion. TREATMENT  An admission to the hospital may not be needed, but a person with confusion should not be left alone. Stay with a family member or friend until the confusion clears. Avoid alcohol, pain relievers, or sedative drugs until you have fully recovered. Do not drive until directed by your health care provider. HOME CARE INSTRUCTIONS  What family and friends can do:  To find out if someone is confused, ask the person to state his or her name, age, and the date. If the person is unsure or answers incorrectly, he or she is confused.  Always introduce yourself, no matter how well the person knows you.  Often remind the person of his or her location.  Place a calendar and clock near the confused person.  Help the person with his or her medicines. You may want to use a pill box, an alarm as a reminder, or give the person each dose as prescribed.  Talk about current events and plans for the day.  Try to keep the environment calm, quiet, and peaceful.  Make sure the person keeps follow-up visits with his or her health care provider. PREVENTION  Ways to prevent confusion:  Avoid alcohol.  Eat a balanced diet.  Get enough sleep.  Take medicine only as directed by your health care provider.  Do not become isolated. Spend time with other people and make plans for your days.  Keep careful watch on your blood sugar levels if you are diabetic. SEEK IMMEDIATE MEDICAL CARE IF:   You develop severe headaches, repeated vomiting, seizures, blackouts, or   slurred speech.  There is increasing confusion, weakness, numbness, restlessness, or personality changes.  You develop a loss of balance, have marked dizziness, feel uncoordinated, or fall.  You have delusions, hallucinations, or develop severe anxiety.  Your family members think you need to be rechecked.   This information is not intended to replace advice given  to you by your health care provider. Make sure you discuss any questions you have with your health care provider.   Document Released: 12/29/2004 Document Revised: 12/12/2014 Document Reviewed: 12/27/2013 Elsevier Interactive Patient Education 2016 Elsevier Inc.  

## 2016-04-21 ENCOUNTER — Emergency Department (HOSPITAL_COMMUNITY)
Admission: EM | Admit: 2016-04-21 | Discharge: 2016-04-21 | Disposition: A | Discharge status: Home / Self Care | Payer: Medicaid Other | Attending: Emergency Medicine | Admitting: Emergency Medicine

## 2016-04-21 ENCOUNTER — Encounter (HOSPITAL_COMMUNITY): Payer: Self-pay

## 2016-04-21 ENCOUNTER — Emergency Department (HOSPITAL_COMMUNITY): Payer: Medicaid Other

## 2016-04-21 DIAGNOSIS — G002 Streptococcal meningitis: Secondary | ICD-10-CM | POA: Diagnosis not present

## 2016-04-21 DIAGNOSIS — Y802 Prosthetic and other implants, materials and accessory physical medicine devices associated with adverse incidents: Secondary | ICD-10-CM | POA: Insufficient documentation

## 2016-04-21 DIAGNOSIS — F329 Major depressive disorder, single episode, unspecified: Secondary | ICD-10-CM | POA: Diagnosis not present

## 2016-04-21 DIAGNOSIS — F17210 Nicotine dependence, cigarettes, uncomplicated: Secondary | ICD-10-CM | POA: Insufficient documentation

## 2016-04-21 DIAGNOSIS — T82524A Displacement of infusion catheter, initial encounter: Secondary | ICD-10-CM | POA: Insufficient documentation

## 2016-04-21 DIAGNOSIS — G039 Meningitis, unspecified: Secondary | ICD-10-CM

## 2016-04-21 DIAGNOSIS — Z452 Encounter for adjustment and management of vascular access device: Secondary | ICD-10-CM | POA: Diagnosis present

## 2016-04-21 DIAGNOSIS — Z789 Other specified health status: Secondary | ICD-10-CM

## 2016-04-21 DIAGNOSIS — Z7982 Long term (current) use of aspirin: Secondary | ICD-10-CM | POA: Diagnosis not present

## 2016-04-21 MED ORDER — CEFTRIAXONE SODIUM 2 G IJ SOLR
2.0000 g | Freq: Once | INTRAMUSCULAR | Status: AC
  Administered 2016-04-21: 2 g via INTRAVENOUS
  Filled 2016-04-21: qty 2

## 2016-04-21 MED ORDER — LIDOCAINE HCL 1 % IJ SOLN
INTRAMUSCULAR | Status: AC
  Administered 2016-04-21: 8 mL
  Filled 2016-04-21: qty 20

## 2016-04-21 NOTE — ED Notes (Signed)
Pt is in stable condition upon d/c and is escorted from the ED via wheelchair. 

## 2016-04-21 NOTE — ED Notes (Signed)
Patient here requesting picc line to be replaced after existing line accidentally pulled out this am. Receives antibiotics through same

## 2016-04-21 NOTE — ED Notes (Signed)
Pt transported to IR 

## 2016-04-21 NOTE — ED Provider Notes (Signed)
CSN: [id number]     Arrival date & time 04/21/16  2130 History   First MD Initiated Contact with Patient 04/21/16 [phone]     Chief Complaint  Patient presents with  . picc line replacement      (Consider location/radiation/quality/duration/timing/severity/associated sxs/prior Treatment) HPI   Level V caveat due to dementia like state from recent meningitis and strokes. 50 year old male who presents for PICC line placement. 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 To receive antibiotics until 04/29/2016. Currently cared for by his brother. He states that last received antibiotics yesterday evening. This morning patient accidentally pulled out his own PICC line. States that he has otherwise been in his usual state of health. Baseline mental status with no recent fevers, nausea or vomiting, or complaints of pain.  Past Medical History  Diagnosis Date  . Depression   . Cardiac arrest ([place])   . Cardiogenic shock ([place])   . Acute respiratory failure with hypoxia ([place])   . Meningitis, streptococcal 02/16/2016  . 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 Tobacco abuse 02/16/2016  . Substance abuse   . ETOH abuse   . Septic shock [facility])    Past Surgical History  Procedure Laterality Date  . Ankle surgery    . Cardiac catheterization N/A 02/17/2016    Procedure: IABP Insertion;  Surgeon: Wellington Hampshire, MD;  Location: [place] [facility];  Service: Cardiovascular;  Laterality: N/A;  . Tee without cardioversion N/A 03/29/2016    Procedure: TRANSESOPHAGEAL ECHOCARDIOGRAM (TEE);  Surgeon: Larey Dresser, MD;  Location: [place];  Service: Cardiovascular;  Laterality: N/A;   No family history on file. Social History  Substance Use Topics  . Smoking status: Current Every Day Smoker -- 0.10 packs/day    Types: Cigarettes  . Smokeless tobacco: None  . Alcohol Use: Yes     Review of Systems  Unable to perform ROS: Patient nonverbal      Allergies  Review of patient's allergies indicates no known allergies.  Home Medications   Prior to Admission medications   Medication Sig Start Date End Date Taking? Authorizing Provider  acetaminophen (TYLENOL) 325 MG tablet Take 650 mg by mouth every 6 (six) hours as needed (pain/fever).     Historical Provider, MD  albuterol (PROVENTIL) (2.5 MG/3ML) 0.083% nebulizer solution Take 3 mLs (2.5 mg total) by nebulization every 3 (three) hours as needed for wheezing. Patient taking differently: Take 2.5 mg by nebulization every 3 (three) hours as needed for wheezing or shortness of breath.  03/10/16   Reyne Dumas, MD  Amino Acids-Protein Hydrolys (FEEDING SUPPLEMENT, PRO-STAT SUGAR FREE 64,) LIQD Take 30 mLs by mouth 3 (three) times daily with meals. 04/04/16   Robbie Lis, MD  aspirin 325 MG tablet Place 1 tablet (325 mg total) into feeding tube daily. 04/04/16   Robbie Lis, MD  atorvastatin (LIPITOR) 20 MG tablet Take 1 tablet (20 mg total) by mouth daily at 6 PM. 04/04/16   Robbie Lis, MD  carvedilol (COREG) 3.125 MG tablet Place 1 tablet (3.125 mg total) into feeding tube 2 (two) times daily with a meal. 04/04/16   Robbie Lis, MD  cefTRIAXone 2 g in dextrose 5 % 50 mL Inject 2 g into the vein every 12 (twelve) hours. 04/05/16 04/30/16  Robbie Lis, MD  famotidine (PEPCID) 40 MG/5ML suspension Place 2.5 mLs (20 mg total) into  feeding tube daily. 04/04/16   Robbie Lis, MD  feeding supplement, ENSURE ENLIVE, (ENSURE ENLIVE) LIQD Take 237 mLs by mouth every 6 (six) hours. 04/04/16   Robbie Lis, MD  folic acid (FOLVITE) 1 MG tablet Place 1 tablet (1 mg total) into feeding tube daily. 04/04/16   Robbie Lis, MD  levETIRAcetam (KEPPRA) 100 MG/ML solution Place 5 mLs (500 mg total) into feeding tube 2 (two) times daily. 04/04/16   Robbie Lis, MD  Maltodextrin-Xanthan Gum (RESOURCE THICKENUP CLEAR) POWD Take 120 g by mouth  as needed. 04/04/16   Robbie Lis, MD  Nutritional Supplements (NUTRITIONAL SUPPLEMENT PO) Take 1 each by mouth 3 (three) times daily. 2 calorie milk shake    Historical Provider, MD  predniSONE (DELTASONE) 5 MG tablet Place 1 tablet (5 mg total) into feeding tube daily with breakfast. 04/04/16   Robbie Lis, MD  tamsulosin (FLOMAX) 0.4 MG CAPS capsule Take 1 capsule (0.4 mg total) by mouth daily. 04/04/16   Robbie Lis, MD  thiamine 100 MG tablet Place 1 tablet (100 mg total) into feeding tube daily. 04/04/16   Robbie Lis, MD  valproic acid (DEPAKENE) 250 MG/5ML syrup Place 5 mLs (250 mg total) into feeding tube every 8 (eight) hours. 04/04/16   Robbie Lis, MD   BP 118/88 mmHg  Pulse 54  Temp(Src) 97.9 F (36.6 C) (Oral)  Resp 18  SpO2 99% Physical Exam Physical Exam  Nursing note and vitals reviewed. Constitutional: Well developed, thin and frail appearing, non-toxic, and in no acute distress Head: Normocephalic and atraumatic.  Mouth/Throat: Oropharynx is clear and moist.  Neck: Normal range of motion. Neck supple. No meningismus. Cardiovascular: Normal rate and regular rhythm.   Pulmonary/Chest: Effort normal and breath sounds normal.  Abdominal: Soft. There is no tenderness. There is no rebound and no guarding.  Musculoskeletal: Normal range of motion.  Neurological: Alert, no facial droop, moves all extremities symmetrically Skin: Skin is warm and dry.  Psychiatric: Cooperative  ED Course  Procedures (including critical care time) Labs Review Labs Reviewed - No data to display  Imaging Review Ir US Guide Vasc Access Right  04/21/2016  INDICATION: Meningitis. Poor venous access. Previously placed PICC line inadvertently removed. Request new PICC line placement. EXAM: ULTRASOUND AND FLUOROSCOPIC GUIDED PICC LINE INSERTION MEDICATIONS: None. CONTRAST:  None FLUOROSCOPY TIME:  12 seconds COMPLICATIONS: None immediate. TECHNIQUE: The procedure, risks, benefits, and alternatives  were explained to the patient's son and spouse and informed written consent was obtained. A timeout was performed prior to the initiation of the procedure. The right upper extremity was prepped with chlorhexidine in a sterile fashion, and a sterile drape was applied covering the operative field. Maximum barrier sterile technique with sterile gowns and gloves were used for the procedure. A timeout was performed prior to the initiation of the procedure. Local anesthesia was provided with 1% lidocaine. Under direct ultrasound guidance, the right basilic vein was accessed with a micropuncture kit after the overlying soft tissues were anesthetized with 1% lidocaine. An ultrasound image was saved for documentation purposes. A guidewire was advanced to the level of the superior caval-atrial junction for measurement purposes and the PICC line was cut to length. A peel-away sheath was placed and a 35 cm, 5 [place], dual lumen was inserted to level of the superior caval-atrial junction. A post procedure spot fluoroscopic was obtained. The catheter easily aspirated and flushed and was sutured in place. A dressing was placed.  The patient tolerated the procedure well without immediate post procedural complication. FINDINGS: After catheter placement, the tip lies within the superior cavoatrial junction. The catheter aspirates and flushes normally and is ready for immediate use. IMPRESSION: Successful ultrasound and fluoroscopic guided placement of a right basilic vein approach, 35 cm, 5 French, dual lumen PICC with tip at the superior caval-atrial junction. The PICC line is ready for immediate use. Read by: Ascencion Dike PA-C Electronically Signed   By: Markus Daft M.D.   On: 04/21/2016 13:19   Ir Fluoro Guide Cv Midline Picc Right  04/21/2016  INDICATION: Meningitis. Poor venous access. Previously placed PICC line inadvertently removed. Request new PICC line placement. EXAM: ULTRASOUND AND FLUOROSCOPIC GUIDED PICC LINE  INSERTION MEDICATIONS: None. CONTRAST:  None FLUOROSCOPY TIME:  12 seconds COMPLICATIONS: None immediate. TECHNIQUE: The procedure, risks, benefits, and alternatives were explained to the patient's son and spouse and informed written consent was obtained. A timeout was performed prior to the initiation of the procedure. The right upper extremity was prepped with chlorhexidine in a sterile fashion, and a sterile drape was applied covering the operative field. Maximum barrier sterile technique with sterile gowns and gloves were used for the procedure. A timeout was performed prior to the initiation of the procedure. Local anesthesia was provided with 1% lidocaine. Under direct ultrasound guidance, the right basilic vein was accessed with a micropuncture kit after the overlying soft tissues were anesthetized with 1% lidocaine. An ultrasound image was saved for documentation purposes. A guidewire was advanced to the level of the superior caval-atrial junction for measurement purposes and the PICC line was cut to length. A peel-away sheath was placed and a 35 cm, 5 [place], dual lumen was inserted to level of the superior caval-atrial junction. A post procedure spot fluoroscopic was obtained. The catheter easily aspirated and flushed and was sutured in place. A dressing was placed. The patient tolerated the procedure well without immediate post procedural complication. FINDINGS: After catheter placement, the tip lies within the superior cavoatrial junction. The catheter aspirates and flushes normally and is ready for immediate use. IMPRESSION: Successful ultrasound and fluoroscopic guided placement of a right basilic vein approach, 35 cm, 5 French, dual lumen PICC with tip at the superior caval-atrial junction. The PICC line is ready for immediate use. Read by: Ascencion Dike PA-C Electronically Signed   By: Markus Daft M.D.   On: 04/21/2016 13:19   I have personally reviewed and evaluated these images and lab results as  part of my medical decision-making.   EKG Interpretation None      MDM   Final diagnoses:  Meningitis  Central venous catheter in place  Encounter for central line placement    50 year old male receiving IV antibiotics as an outpatient through PICC line for streptococcal meningitis who presents with PICC line displacement this morning. He is at his baseline mental status per family. He is afebrile, hemodynamically stable. No meningismus. IR consult Ted, and patient at PICC line placed by IR. He did receive 2 g of ceftriaxone which was due this morning. Appropriate for continued outpatient management. Strict return and follow-up instructions reviewed with brother who is his caregiver. He expressed understanding of all discharge instructions and felt comfortable with the plan of care.     Forde Dandy, MD 04/21/16 [phone]

## 2016-04-21 NOTE — Procedures (Signed)
Successful placement of dual lumen PICC line to right basilic vein. Length 35 cm Tip at lower SVC/RA No complications Ready for use.  Brayton ElBRUNING, Estes Lehner PA-C 12:02 PM

## 2016-04-21 NOTE — Discharge Instructions (Signed)
Your PICC line is now in place. His morning dose of ceftriaxone was given here in the ED today. He will need his evening dose when he gets home.  Return for worsening symptoms, including fever, vomiting, confusion, or any other symptoms concerning to you.

## 2016-04-21 NOTE — ED Notes (Signed)
Pt returned from IR. IR states pt wasn't heparin locked in case he needs more antibiotics or meds prior to d/c. Will need to flush PICC line with heparin before d/c.

## 2016-04-22 ENCOUNTER — Ambulatory Visit: Payer: Self-pay | Admitting: Family Medicine

## 2016-04-25 ENCOUNTER — Inpatient Hospital Stay (HOSPITAL_COMMUNITY)
Admission: EM | Admit: 2016-04-25 | Discharge: 2016-04-29 | DRG: 095 | Disposition: A | Discharge status: Skilled Nursing Facility | Payer: Medicaid Other | Attending: Family Medicine | Admitting: Family Medicine

## 2016-04-25 ENCOUNTER — Encounter: Payer: Self-pay | Admitting: Infectious Diseases

## 2016-04-25 ENCOUNTER — Encounter (HOSPITAL_COMMUNITY): Payer: Self-pay | Admitting: Emergency Medicine

## 2016-04-25 DIAGNOSIS — I110 Hypertensive heart disease with heart failure: Secondary | ICD-10-CM | POA: Diagnosis present

## 2016-04-25 DIAGNOSIS — Z590 Homelessness: Secondary | ICD-10-CM

## 2016-04-25 DIAGNOSIS — G002 Streptococcal meningitis: Principal | ICD-10-CM | POA: Diagnosis present

## 2016-04-25 DIAGNOSIS — E870 Hyperosmolality and hypernatremia: Secondary | ICD-10-CM | POA: Diagnosis present

## 2016-04-25 DIAGNOSIS — I5042 Chronic combined systolic (congestive) and diastolic (congestive) heart failure: Secondary | ICD-10-CM | POA: Diagnosis present

## 2016-04-25 DIAGNOSIS — G40909 Epilepsy, unspecified, not intractable, without status epilepticus: Secondary | ICD-10-CM | POA: Diagnosis present

## 2016-04-25 DIAGNOSIS — E785 Hyperlipidemia, unspecified: Secondary | ICD-10-CM | POA: Diagnosis present

## 2016-04-25 DIAGNOSIS — Z7982 Long term (current) use of aspirin: Secondary | ICD-10-CM

## 2016-04-25 DIAGNOSIS — Z79899 Other long term (current) drug therapy: Secondary | ICD-10-CM

## 2016-04-25 DIAGNOSIS — F329 Major depressive disorder, single episode, unspecified: Secondary | ICD-10-CM | POA: Diagnosis present

## 2016-04-25 DIAGNOSIS — R339 Retention of urine, unspecified: Secondary | ICD-10-CM | POA: Diagnosis present

## 2016-04-25 DIAGNOSIS — F0390 Unspecified dementia without behavioral disturbance: Secondary | ICD-10-CM | POA: Diagnosis present

## 2016-04-25 DIAGNOSIS — F17210 Nicotine dependence, cigarettes, uncomplicated: Secondary | ICD-10-CM | POA: Diagnosis present

## 2016-04-25 DIAGNOSIS — E860 Dehydration: Secondary | ICD-10-CM | POA: Diagnosis present

## 2016-04-25 DIAGNOSIS — E46 Unspecified protein-calorie malnutrition: Secondary | ICD-10-CM | POA: Diagnosis present

## 2016-04-25 DIAGNOSIS — Z8661 Personal history of infections of the central nervous system: Secondary | ICD-10-CM | POA: Diagnosis present

## 2016-04-25 DIAGNOSIS — R1310 Dysphagia, unspecified: Secondary | ICD-10-CM | POA: Diagnosis present

## 2016-04-25 DIAGNOSIS — Z452 Encounter for adjustment and management of vascular access device: Secondary | ICD-10-CM

## 2016-04-25 DIAGNOSIS — Z8674 Personal history of sudden cardiac arrest: Secondary | ICD-10-CM

## 2016-04-25 DIAGNOSIS — R414 Neurologic neglect syndrome: Secondary | ICD-10-CM | POA: Diagnosis present

## 2016-04-25 NOTE — [place] FL2 (Signed)
[place] MEDICAID FL2 LEVEL OF CARE SCREENING TOOL     IDENTIFICATION  Patient Name: Dale [name] Birthdate: 12/31/1965 Sex: male Admission Date (Current Location): 04/25/2016  [facility] [place]   Facility and Address:  [facility] [place], [address], [place], [place] [address]      Provider Number: [id number]  Attending Physician Name and Address:  Eber HongBrian Miller, MD  Relative Name and Phone Number:       Current Level of Care: Hospital Recommended Level of Care: Skilled Nursing Facility Prior Approval Number:    Date Approved/Denied:   PASRR Number:  [id number])  Discharge Plan: SNF    Current Diagnoses: Patient Active Problem List   Diagnosis Date Noted  . Protein-calorie malnutrition, severe (HCC)   . Palliative care encounter   . Cerebral septic emboli (HCC)   . Sepsis (HCC)   . Cerebral thrombosis with cerebral infarction 03/25/2016  . Meningoencephalitis   . Encephalopathy acute   . Bacterial meningitis   . Acute ischemic stroke (HCC)   . Malnutrition of moderate degree 03/24/2016  . Severe sepsis (HCC)   . Meningitis, pneumococcal, recurrent   . Systolic and diastolic CHF, chronic (HCC)   . Acute renal failure (HCC)   . Hypernatremia   . Hypothermia 03/20/2016  . NSTEMI (non-ST elevated myocardial infarction) (HCC) 03/14/2016  . Physical deconditioning 03/14/2016  . Tracheostomy status (HCC)   . Encounter for palliative care   . Goals of care, counseling/discussion   . Tracheostomy care (HCC)   . Respiratory failure (HCC) 03/03/2016  . Acute respiratory failure with hypoxia (HCC)   . Septic shock (HCC)   . Cardiogenic shock (HCC)   . Acute systolic heart failure (HCC)   . Cardiac arrest (HCC)   . Hydrocephalus   . Altered mental status   . CHF (congestive heart failure) (HCC)   . 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 Homeless 02/16/2016  . Meningitis, streptococcal 02/16/2016  . Tobacco abuse  02/16/2016  . AKI (acute kidney injury) (HCC) 02/16/2016  . Gram positive sepsis (HCC) 02/16/2016  . Encephalopathy, metabolic 02/16/2016  . Underweight 02/16/2016  . ETOH abuse   . Substance abuse   . Acute encephalopathy   . Acute respiratory failure (HCC)     Orientation RESPIRATION BLADDER Height & Weight     Self  Normal Continent Weight:   Height:     BEHAVIORAL SYMPTOMS/MOOD NEUROLOGICAL BOWEL NUTRITION STATUS      Continent  (DYS I Nectar thick )  AMBULATORY STATUS COMMUNICATION OF NEEDS Skin   Extensive Assist Non-Verbally Normal                       Personal Care Assistance Level of Assistance  Bathing, Feeding, Dressing Bathing Assistance: Maximum assistance Feeding assistance: Maximum assistance Dressing Assistance: Maximum assistance     Functional Limitations Info  Sight, Hearing, Speech Sight Info: Adequate Hearing Info: Adequate Speech Info: Adequate    SPECIAL CARE FACTORS FREQUENCY  PT (By licensed PT), OT (By licensed OT), Speech therapy     PT Frequency:  (5x/week) OT Frequency:  (5x/week)     Speech Therapy Frequency:  (5x/week)      Contractures Contractures Info: Present    Additional Factors Info  Isolation Precautions Code Status Info:  (full code)       Isolation Precautions Info:  (MRSA)     Current Medications (04/25/2016):  This is the current hospital  active medication list No current facility-administered medications for this encounter.   Current Outpatient Prescriptions  Medication Sig Dispense Refill  . acetaminophen (TYLENOL) 325 MG tablet Take 650 mg by mouth every 6 (six) hours as needed (pain/fever).     Marland Kitchen albuterol (PROVENTIL) (2.5 MG/3ML) 0.083% nebulizer solution Take 3 mLs (2.5 mg total) by nebulization every 3 (three) hours as needed for wheezing. (Patient taking differently: Take 2.5 mg by nebulization every 3 (three) hours as needed for wheezing or shortness of breath. ) 75 mL 12  . Amino Acids-Protein  Hydrolys (FEEDING SUPPLEMENT, PRO-STAT SUGAR FREE 64,) LIQD Take 30 mLs by mouth 3 (three) times daily with meals. 900 mL 0  . aspirin 325 MG tablet Place 1 tablet (325 mg total) into feeding tube daily. 30 tablet 0  . atorvastatin (LIPITOR) 20 MG tablet Take 1 tablet (20 mg total) by mouth daily at 6 PM. 30 tablet 0  . carvedilol (COREG) 3.125 MG tablet Place 1 tablet (3.125 mg total) into feeding tube 2 (two) times daily with a meal. 60 tablet 0  . cefTRIAXone 2 g in dextrose 5 % 50 mL Inject 2 g into the vein every 12 (twelve) hours. 52 Syringe 0  . famotidine (PEPCID) 40 MG/5ML suspension Place 2.5 mLs (20 mg total) into feeding tube daily. 50 mL 0  . feeding supplement, ENSURE ENLIVE, (ENSURE ENLIVE) LIQD Take 237 mLs by mouth every 6 (six) hours. 237 mL 12  . folic acid (FOLVITE) 1 MG tablet Place 1 tablet (1 mg total) into feeding tube daily. 30 tablet 0  . levETIRAcetam (KEPPRA) 100 MG/ML solution Place 5 mLs (500 mg total) into feeding tube 2 (two) times daily. 473 mL 12  . Maltodextrin-Xanthan Gum (RESOURCE THICKENUP CLEAR) POWD Take 120 g by mouth as needed. 1 Can 0  . Nutritional Supplements (NUTRITIONAL SUPPLEMENT PO) Take 1 each by mouth 3 (three) times daily. 2 calorie milk shake    . predniSONE (DELTASONE) 5 MG tablet Place 1 tablet (5 mg total) into feeding tube daily with breakfast. 2 tablet 0  . tamsulosin (FLOMAX) 0.4 MG CAPS capsule Take 1 capsule (0.4 mg total) by mouth daily. 30 capsule 0  . thiamine 100 MG tablet Place 1 tablet (100 mg total) into feeding tube daily. 30 tablet 0  . valproic acid (DEPAKENE) 250 MG/5ML syrup Place 5 mLs (250 mg total) into feeding tube every 8 (eight) hours. 240 mL 0     Discharge Medications: Please see discharge summary for a list of discharge medications.  Relevant Imaging Results:  Relevant Lab Results:   Additional Information  [phone])  Calbert Hulsebus M, LCSW

## 2016-04-25 NOTE — Progress Notes (Signed)
CSW and RNCM spoke with pt's wife at bedside.  Per his wife, she and pt are legally still married although they have been separated for 20 years.  Wife concerned that pt's brother was allowed to take pt home following last d/c instead of [facility] and that she was not involved in the d/c plan.   Per wife, pt's brother is in poor health and cannot take care of pt, as is she.  Wife [facility] where pt can continue his IV ABX and get continued therapy.  Pt is Medicaid and disability pending.  Pt's wife aware that LOG will need to be approved and [place] may not be able to be [place].  Dayshift CSW will f/u with CSW leadership re: LOG placement.  FL2 has been prepared and faxed out.

## 2016-04-25 NOTE — ED Provider Notes (Signed)
CSN: [id number]     Arrival date & time 04/25/16  1217 History   First MD Initiated Contact with Patient 04/25/16 1848     Chief Complaint  Patient presents with  . Vascular Access Problem     (Consider location/radiation/quality/duration/timing/severity/associated sxs/prior Treatment) HPI Comments: The patient is a 50 year old male, he has a history of meningitis for which she has been admitted twice most recently approximately one month ago. He was sent home with a PICC line and antibiotics to live with his brother who has not been able to take care of him and now states that he can take care of him no longer. Mysteriously the patient's PICC line came out and his wife brings him to the hospital who he does not live with. She states that she cannot take care of him at all and thus he needs to be placed in a nursing home. When asked about his symptoms the patient states that he feels fine, he clearly has difficulty with mental status as far as processing information but appears to be at baseline and his wife states that this is how he was when he was discharged from the hospital. He has been getting ongoing antibodies through his PICC line since discharge, there was an issue with the PICC line not working 4 days ago at which time he was seen in the hospital.  The history is provided by the patient.    Past Medical History  Diagnosis Date  . Depression   . Cardiac arrest (HCC)   . Cardiogenic shock (HCC)   . Acute respiratory failure with hypoxia (HCC)   . Meningitis, streptococcal 02/16/2016  . 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 Tobacco abuse 02/16/2016  . Substance abuse   . ETOH abuse   . Septic shock [facility](HCC)    Past Surgical History  Procedure Laterality Date  . Ankle surgery    . Cardiac catheterization N/A 02/17/2016    Procedure: IABP Insertion;  Surgeon: Iran OuchMuhammad A Arida, MD;  Location: [facility] INVASIVE CV LAB;  Service: Cardiovascular;  Laterality: N/A;  . Tee without  cardioversion N/A 03/29/2016    Procedure: TRANSESOPHAGEAL ECHOCARDIOGRAM (TEE);  Surgeon: Laurey Moralealton S McLean, MD;  [facility];  Service: Cardiovascular;  Laterality: N/A;   No family history on file. Social History  Substance Use Topics  . Smoking status: Current Every Day Smoker -- 0.10 packs/day    Types: Cigarettes  . Smokeless tobacco: None  . Alcohol Use: Yes    Review of Systems  All other systems reviewed and are negative.     Allergies  Review of patient's allergies indicates no known allergies.  Home Medications   Prior to Admission medications   Medication Sig Start Date End Date Taking? Authorizing Provider  acetaminophen (TYLENOL) 325 MG tablet Take 650 mg by mouth every 6 (six) hours as needed (pain/fever).     Historical Provider, MD  albuterol (PROVENTIL) (2.5 MG/3ML) 0.083% nebulizer solution Take 3 mLs (2.5 mg total) by nebulization every 3 (three) hours as needed for wheezing. Patient taking differently: Take 2.5 mg by nebulization every 3 (three) hours as needed for wheezing or shortness of breath.  03/10/16   Richarda OverlieNayana Abrol, MD  Amino Acids-Protein Hydrolys (FEEDING SUPPLEMENT, PRO-STAT SUGAR FREE 64,) LIQD Take 30 mLs by mouth 3 (three) times daily with meals. 04/04/16   Alison MurrayAlma M Devine, MD  aspirin 325 MG tablet Place 1 tablet (325 mg total) into feeding tube daily. 04/04/16   Alison MurrayAlma M Devine, MD  atorvastatin (LIPITOR) 20 MG tablet Take 1 tablet (20 mg total) by mouth daily at 6 PM. 04/04/16   Alison Murray, MD  carvedilol (COREG) 3.125 MG tablet Place 1 tablet (3.125 mg total) into feeding tube 2 (two) times daily with a meal. 04/04/16   Alison Murray, MD  cefTRIAXone 2 g in dextrose 5 % 50 mL Inject 2 g into the vein every 12 (twelve) hours. 04/05/16 04/30/16  Alison Murray, MD  famotidine (PEPCID) 40 MG/5ML suspension Place 2.5 mLs (20 mg total) into feeding tube daily. 04/04/16   Alison Murray, MD  feeding supplement, ENSURE ENLIVE, (ENSURE ENLIVE) LIQD Take 237 mLs  by mouth every 6 (six) hours. 04/04/16   Alison Murray, MD  folic acid (FOLVITE) 1 MG tablet Place 1 tablet (1 mg total) into feeding tube daily. 04/04/16   Alison Murray, MD  levETIRAcetam (KEPPRA) 100 MG/ML solution Place 5 mLs (500 mg total) into feeding tube 2 (two) times daily. 04/04/16   Alison Murray, MD  Maltodextrin-Xanthan Gum (RESOURCE THICKENUP CLEAR) POWD Take 120 g by mouth as needed. 04/04/16   Alison Murray, MD  Nutritional Supplements (NUTRITIONAL SUPPLEMENT PO) Take 1 each by mouth 3 (three) times daily. 2 calorie milk shake    Historical Provider, MD  predniSONE (DELTASONE) 5 MG tablet Place 1 tablet (5 mg total) into feeding tube daily with breakfast. 04/04/16   Alison Murray, MD  tamsulosin (FLOMAX) 0.4 MG CAPS capsule Take 1 capsule (0.4 mg total) by mouth daily. 04/04/16   Alison Murray, MD  thiamine 100 MG tablet Place 1 tablet (100 mg total) into feeding tube daily. 04/04/16   Alison Murray, MD  valproic acid (DEPAKENE) 250 MG/5ML syrup Place 5 mLs (250 mg total) into feeding tube every 8 (eight) hours. 04/04/16   Alison Murray, MD   BP 134/90 mmHg  Pulse 61  Temp(Src) 97 F (36.1 C) (Oral)  Resp 18  SpO2 97% Physical Exam  Constitutional: He appears well-developed and well-nourished. No distress.  HENT:  Head: Normocephalic and atraumatic.  Mouth/Throat: Oropharynx is clear and moist. No oropharyngeal exudate.  Eyes: Conjunctivae and EOM are normal. Pupils are equal, round, and reactive to light. Right eye exhibits no discharge. Left eye exhibits no discharge. No scleral icterus.  Neck: Normal range of motion. Neck supple. No JVD present. No thyromegaly present.  Cardiovascular: Normal rate, regular rhythm, normal heart sounds and intact distal pulses.  Exam reveals no gallop and no friction rub.   No murmur heard. Pulmonary/Chest: Effort normal and breath sounds normal. No respiratory distress. He has no wheezes. He has no rales.  Abdominal: Soft. Bowel sounds are normal. He  exhibits no distension and no mass. There is no tenderness.  Musculoskeletal: Normal range of motion. He exhibits no edema or tenderness.  Right upper extremity PICC line site, the IV is completely out of the arm, there is no surrounding redness bleeding drainage or bruising and no swelling  Lymphadenopathy:    He has no cervical adenopathy.  Neurological: He is alert. Coordination normal.  Skin: Skin is warm and dry. No rash noted. No erythema.  Psychiatric: He has a normal mood and affect. His behavior is normal.  Nursing note and vitals reviewed.   ED Course  Procedures (including critical care time) Labs Review Labs Reviewed - No data to display  Imaging Review No results found. I have personally reviewed and evaluated these images and lab results as  part of my medical decision-making.    MDM   Final diagnoses:  Needs peripherally inserted central catheter (PICC)    The patient is in no distress, vital signs are unremarkable, neurologic status is baseline according to the wife, we'll attempt to replace the PICC line, the patient will likely need to have increased assistance with placement as he now has nowhere to go.  The PICC team has stated they can't do procedure tonight - they request IR - order has been placed  The SW / Care management team has seen pt and will assist with placement in the AM  Pt is stable and well appearing, Pod C overnight Dispo per SW in the AM. Pt has no home to go to - wife will not take him and brother states he can't take are of him any longer.  Eber Hong, MD 04/26/16 1055

## 2016-04-25 NOTE — ED Notes (Signed)
Per pts family pt accidentally pulled his picc line out. No distress at this time.

## 2016-04-25 NOTE — Progress Notes (Signed)
CSW received T/C from Patient's wife, Mickeal SkinnerShirley Kiraly [phone]) reporting that [facility]. She reports that she and Patient are not legally separated as previously noted by Patient's brother per [name]. She reports that Patient is in need of SNF placement as Patient's brother is unable to care for Patient resulting in multiple ED visits due to Patient's PICC Line. [name] is requesting to be kept updated on Patient's care and disposition plan. CSW to staff with 2nd shift ED CSW as Patient remains in the waiting room at this time.          Lance MussAshley Gardner,MSW, LCSW [facility] ED/63M Clinical Social Worker [phone]

## 2016-04-26 ENCOUNTER — Emergency Department (HOSPITAL_COMMUNITY): Payer: Medicaid Other

## 2016-04-26 IMAGING — XA IR US GUIDE VASC ACCESS RIGHT
1 series · 3 of 3 positions shown · non-contrast
Comparison: none

INDICATION: Meningitis. Poor venous access. Previously placed PICC line
inadvertently removed. Request new PICC line placement.

[Series 1: fl (-) angio · 3 of 3 slices shown]
[im 1/3]
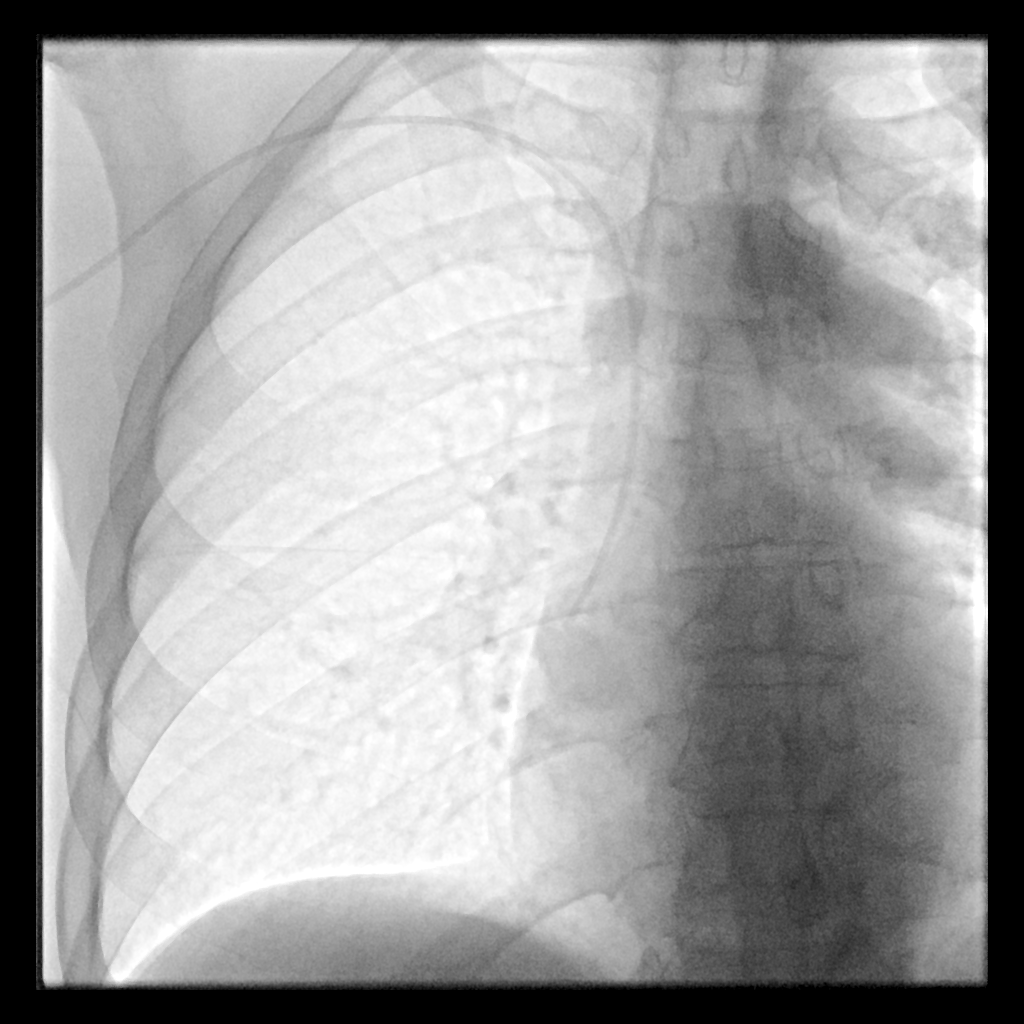
[im 2/3]
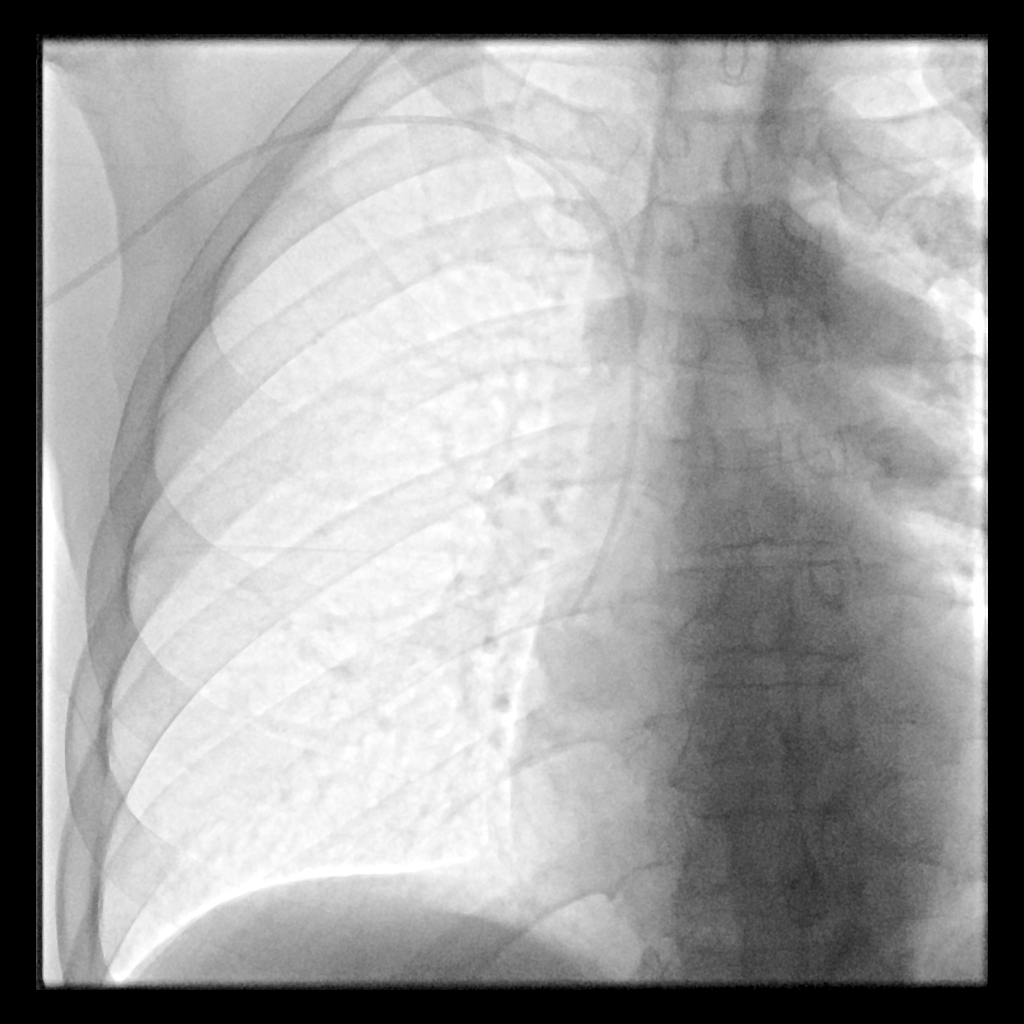
[im 3/3]
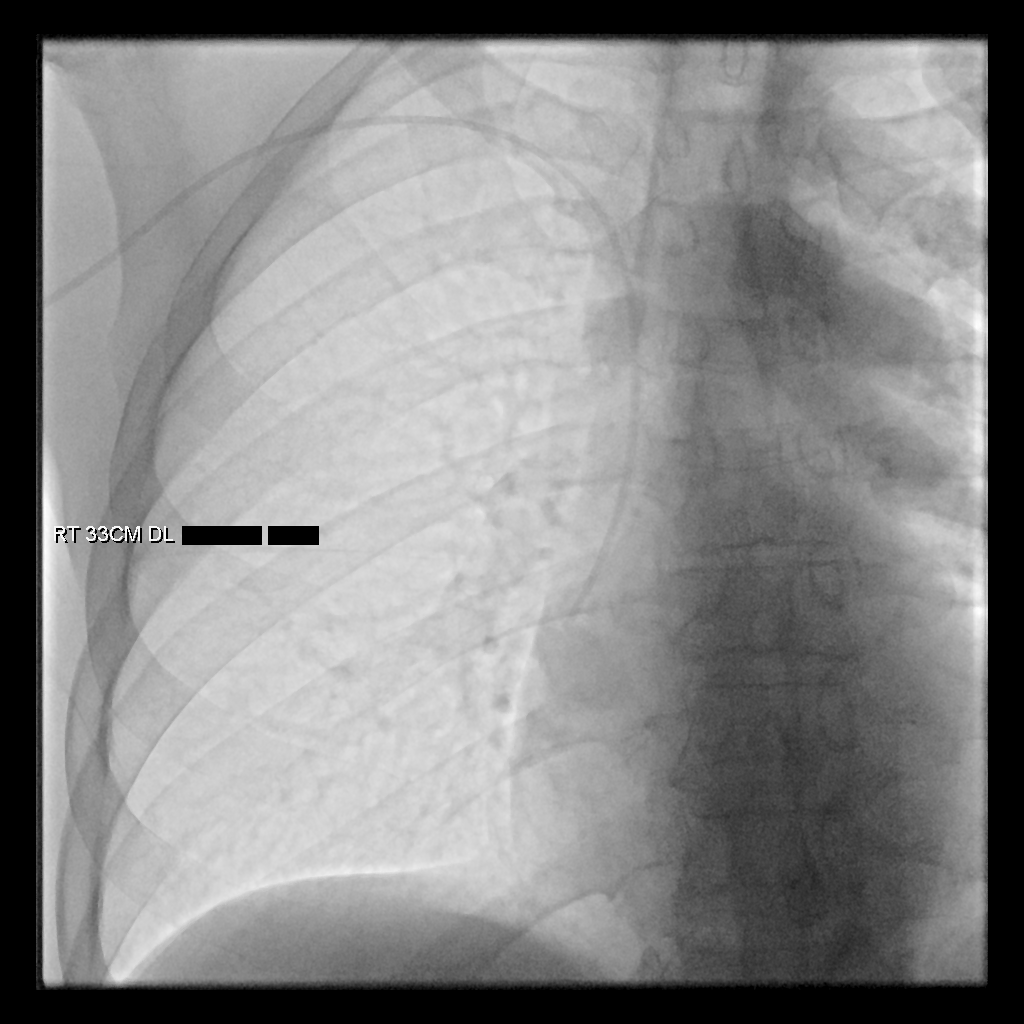

[3 of 3 positions shown; findings below may reference images not displayed]

EXAM:
RIGHT UPPER EXTREMITY PICC LINE PLACEMENT WITH ULTRASOUND AND
FLUOROSCOPIC GUIDANCE

MEDICATIONS:
1% Lidocaine

ANESTHESIA/SEDATION:
No sedation medication given.

FLUOROSCOPY TIME:  Fluoroscopy Time: 0 minutes 12 seconds.

COMPLICATIONS:
None immediate.

PROCEDURE:
The patient was advised of the possible risks and complications and
agreed to undergo the procedure. The patient was then brought to the
angiographic suite for the procedure.

The right arm was prepped with chlorhexidine, draped in the usual
sterile fashion using maximum barrier technique (cap and mask,
sterile gown, sterile gloves, large sterile sheet, hand hygiene and
cutaneous antisepsis) and infiltrated locally with 1% Lidocaine.

Ultrasound demonstrated patency of the right basilic vein, and this
was documented with an image. Under real-time ultrasound guidance,
this vein was accessed with a 21 gauge micropuncture needle and
image documentation was performed. A [DATE] wire was introduced in to
the vein. Over this, a 5 french dual lumen power injectable PICC was
advanced to the lower SVC/right atrial junction. Fluoroscopy during
the procedure and fluoro spot radiograph confirms appropriate
catheter position. The catheter was sutured in place and flushed and
covered with a sterile dressing.

Catheter length:  33 cm
IMPRESSION: Successful right arm Power PICC line placement with ultrasound and
fluoroscopic guidance. The catheter is ready for use.

## 2016-04-26 MED ORDER — LIDOCAINE HCL 1 % IJ SOLN
INTRAMUSCULAR | Status: AC
  Filled 2016-04-26: qty 20

## 2016-04-26 NOTE — ED Notes (Signed)
Pt noted to be ambulating in hallway - assisted back to room to stretcher. Pt got up from stretcher again - assisted back. Pt unable to follow directions. Pt moved to C23. Dominga FerryJ Branch, Consulting civil engineerCharge RN, aware of need for sitter for pt safety.

## 2016-04-26 NOTE — ED Notes (Signed)
Pt had incontinent episode, linen on bed changed, pt provided with clean scrubs and brief.

## 2016-04-26 NOTE — Procedures (Signed)
Successful placement of dual lumen PICC line to right basilic vein. Length 33 cm Tip at lower SVC/RA No complications Ready for use.  Siana Panameno S Yania Bogie PA-C

## 2016-04-26 NOTE — ED Notes (Signed)
Pt continuously changing channels on tv and turning volume loud. RN assists w/decreasing volume then pt performs same again. Comfort measures performed - pillow given, stretcher adjusted for comfort.

## 2016-04-26 NOTE — ED Notes (Signed)
Patients PICC in the right upper arm, partially covered by tegaderm dressing.  Site is intact with no bleeding present.

## 2016-04-26 NOTE — ED Notes (Signed)
Wife at bedside.

## 2016-04-26 NOTE — ED Notes (Signed)
Assisted pt w/lying on bed d/t pt got up and attempting to ambulate in hallway. Pt noted w/confusion.

## 2016-04-26 NOTE — ED Notes (Signed)
Pt speaking to wife on phone.

## 2016-04-26 NOTE — Care Management Note (Signed)
Case Management Note  Patient Details  Name: Dale [name] MRN: [id number] Date of Birth: 05/06/1966  Subjective/Objective:                  50 year old male, he has a history of meningitis for which she has been admitted twice most recently approximately one month ago. He was sent home with a PICC line and antibiotics to live with his brother who has not been able to take care of him and now states that he can take care of him no longer.   Action/Plan: Follow for disposition needs. Possible SNF disposition.   Expected Discharge Date:     04/26/16             Expected Discharge Plan:  Skilled Nursing Facility  In-House Referral:  Clinical Social Work  Discharge planning Services  CM Consult  Post Acute Care Choice:    Choice offered to:     DME Arranged:    DME Agency:     HH Arranged:    HH Agency:     Status of Service:  In process, will continue to follow  Medicare Important Message Given:    Date Medicare IM Given:    Medicare IM give by:    Date Additional Medicare IM Given:    Additional Medicare Important Message give by:     If discussed at Long Length of Stay Meetings, dates discussed:    Additional Comments:  Oletta CohnWood, Everlynn Sagun, RN 04/26/2016, 10:21 AM

## 2016-04-26 NOTE — ED Notes (Signed)
Patient transported to IR for replacement of PICC line.

## 2016-04-26 NOTE — Progress Notes (Signed)
Pt has no SNF bed offers at this time, CSW will continue search efforts.

## 2016-04-26 NOTE — ED Notes (Signed)
Patient was up walking around in room without any clothes on, Pt. Had removed brief, and gown. Another brief and gown was placed on patient and he was assisted back into bed, the Nurse was informed.

## 2016-04-26 NOTE — ED Notes (Signed)
Dale SkinnerShirley [name] wife [phone] [facility](House), [phone] (cell), [phone] (cell)

## 2016-04-27 ENCOUNTER — Encounter (HOSPITAL_COMMUNITY): Payer: Self-pay | Admitting: Family Medicine

## 2016-04-27 DIAGNOSIS — G002 Streptococcal meningitis: Secondary | ICD-10-CM

## 2016-04-27 DIAGNOSIS — F0390 Unspecified dementia without behavioral disturbance: Secondary | ICD-10-CM | POA: Diagnosis present

## 2016-04-27 DIAGNOSIS — E43 Unspecified severe protein-calorie malnutrition: Secondary | ICD-10-CM

## 2016-04-27 HISTORY — DX: Streptococcal meningitis: G00.2

## 2016-04-27 HISTORY — DX: Unspecified dementia, unspecified severity, without behavioral disturbance, psychotic disturbance, mood disturbance, and anxiety: F03.90

## 2016-04-27 MED ORDER — VITAMIN B-1 100 MG PO TABS
100.0000 mg | ORAL_TABLET | Freq: Every day | ORAL | Status: DC
  Administered 2016-04-28 – 2016-04-29 (×2): 100 mg
  Filled 2016-04-27 (×2): qty 1

## 2016-04-27 MED ORDER — LEVETIRACETAM 500 MG PO TABS
500.0000 mg | ORAL_TABLET | Freq: Two times a day (BID) | ORAL | Status: DC
  Administered 2016-04-27 – 2016-04-29 (×4): 500 mg via ORAL
  Filled 2016-04-27 (×4): qty 1

## 2016-04-27 MED ORDER — DIVALPROEX SODIUM 250 MG PO DR TAB
250.0000 mg | DELAYED_RELEASE_TABLET | Freq: Three times a day (TID) | ORAL | Status: DC
  Administered 2016-04-27 – 2016-04-29 (×6): 250 mg via ORAL
  Filled 2016-04-27 (×8): qty 1

## 2016-04-27 MED ORDER — CEFTRIAXONE SODIUM 2 G IJ SOLR
2.0000 g | Freq: Two times a day (BID) | INTRAMUSCULAR | Status: DC
  Administered 2016-04-27 – 2016-04-29 (×5): 2 g via INTRAVENOUS
  Filled 2016-04-27 (×6): qty 2

## 2016-04-27 MED ORDER — FOLIC ACID 1 MG PO TABS
1.0000 mg | ORAL_TABLET | Freq: Every day | ORAL | Status: DC
  Administered 2016-04-28 – 2016-04-29 (×2): 1 mg
  Filled 2016-04-27 (×2): qty 1

## 2016-04-27 MED ORDER — FAMOTIDINE 40 MG/5ML PO SUSR: 20.0000 mg | Freq: Every day | ORAL | Status: DC

## 2016-04-27 MED ORDER — LEVETIRACETAM 100 MG/ML PO SOLN: 500.0000 mg | Freq: Two times a day (BID) | ORAL | Status: DC

## 2016-04-27 MED ORDER — ENOXAPARIN SODIUM 40 MG/0.4ML ~~LOC~~ SOLN
40.0000 mg | Freq: Every day | SUBCUTANEOUS | Status: DC
  Administered 2016-04-28 – 2016-04-29 (×2): 40 mg via SUBCUTANEOUS
  Filled 2016-04-27 (×2): qty 0.4

## 2016-04-27 MED ORDER — CARVEDILOL 3.125 MG PO TABS
3.1250 mg | ORAL_TABLET | Freq: Two times a day (BID) | ORAL | Status: DC
  Administered 2016-04-28 – 2016-04-29 (×4): 3.125 mg via ORAL
  Filled 2016-04-27 (×4): qty 1

## 2016-04-27 MED ORDER — ATORVASTATIN CALCIUM 20 MG PO TABS
20.0000 mg | ORAL_TABLET | Freq: Every day | ORAL | Status: DC
  Administered 2016-04-27 – 2016-04-29 (×3): 20 mg via ORAL
  Filled 2016-04-27: qty 1
  Filled 2016-04-27: qty 2
  Filled 2016-04-27: qty 1

## 2016-04-27 MED ORDER — ASPIRIN 325 MG PO TABS
325.0000 mg | ORAL_TABLET | Freq: Every day | ORAL | Status: DC
  Administered 2016-04-27: 325 mg
  Filled 2016-04-27: qty 1

## 2016-04-27 MED ORDER — ALBUTEROL SULFATE (2.5 MG/3ML) 0.083% IN NEBU: 2.5000 mg | INHALATION_SOLUTION | RESPIRATORY_TRACT | Status: DC | PRN

## 2016-04-27 MED ORDER — ASPIRIN 325 MG PO TABS
325.0000 mg | ORAL_TABLET | Freq: Every day | ORAL | Status: DC
  Administered 2016-04-28 – 2016-04-29 (×2): 325 mg via ORAL
  Filled 2016-04-27 (×2): qty 1

## 2016-04-27 MED ORDER — ENSURE ENLIVE PO LIQD
237.0000 mL | Freq: Four times a day (QID) | ORAL | Status: DC
  Administered 2016-04-27 – 2016-04-29 (×10): 237 mL via ORAL
  Filled 2016-04-27 (×2): qty 237

## 2016-04-27 MED ORDER — VALPROATE SODIUM 250 MG/5ML PO SYRP: 250.0000 mg | ORAL_SOLUTION | Freq: Three times a day (TID) | ORAL | Status: DC

## 2016-04-27 MED ORDER — CARVEDILOL 3.125 MG PO TABS
3.1250 mg | ORAL_TABLET | Freq: Two times a day (BID) | ORAL | Status: DC
  Administered 2016-04-27: 3.125 mg
  Filled 2016-04-27 (×2): qty 1

## 2016-04-27 MED ORDER — FAMOTIDINE 20 MG PO TABS
20.0000 mg | ORAL_TABLET | Freq: Every day | ORAL | Status: DC
  Administered 2016-04-27: 20 mg via ORAL
  Filled 2016-04-27: qty 1

## 2016-04-27 NOTE — ED Notes (Signed)
Pt finished with food.  Ate everything on his tray including the ensure provided.  Pt fed himself.

## 2016-04-27 NOTE — ED Notes (Signed)
[phone]

## 2016-04-27 NOTE — Clinical Social Work Note (Signed)
Clinical Social Work Assessment  Patient Details  Name: Dale [name] MRN: [id number] Date of Birth: 09/01/1966  Date of referral:  04/25/16               Reason for consult:  Facility Placement                Permission sought to share information with:  Oceanographeracility Contact Representative Permission granted to share information::  Yes, Verbal Permission Granted  Name::        Agency::     Relationship::     Contact Information:     Housing/Transportation Living arrangements for the past 2 months:  Homeless, Skilled Nursing Facility, Single Family Home Source of Information:  Spouse Patient Interpreter Needed:  None Criminal Activity/Legal Involvement Pertinent to Current Situation/Hospitalization:  Yes (has outstanding warrants related to motor vehicle/operation of motoe vehicle) Significant Relationships:  Spouse, Adult Children, Siblings Lives with:  Siblings (pt was living with his brother, [name], [place], but brother unable to care for him) Do you feel safe going back to the place where you live?  No Need for family participation in patient care:  Yes (Comment)  Care giving concerns:  Pt was d/c'ed home in the care of his brother on 04/05/16 with medical team not communicating with his wife about the d/c plan, per wife.  W[facility].  Pt has been in the ED 4 times since 5/13, 3x for PICC being pulled out and once because he was found wandering outside wearing only a shirt by [employer].  Wife states that neither her, nor pt's brother can physically take care of pt and she is requesting he be placed in a NH.  Per wife, pt's brother only agreed to take pt home (behaind her back) was that he thought he could get pt's check, but pt's disability is pending, so there was no check to get.  Wife states that she has CA and is receiving chemotherapy, while caring for 3 minor grandchildren who life with her.  It is also important to note that while pt/wife have not been together  in 21 years, they are still legally married.  Social Worker assessment / plan: CSW has been trying to secure placement on behalf of pt from the ED, with no success.  CSW continues to seek options for pt's disposition with assistance from CSW leadership.  Employment status:  Disabled (Comment on whether or not currently receiving Disability), Unemployed Insurance information[place]  [place] PT Recommendations:  Not assessed at this time Information / Referral to community resources:  Skilled Nursing Facility  Patient/Family's Response to care: It is unclear to CSW what pt understands re: his clinical state/d/c plan secondary to his altered mental status/orientation/cognitive impairment. Wife is adamant that she cannot provide 24 hour care to pt in her home because of her CA/treatments and her responsibility to rear her 3 minor grandchildren in the absence of their mother.  She is agreeable to LTC placement on behalf of pt, as she doesn't forsee herself caring for him in his present state. (Incidentally, pt was homeless/couch surfing prior to the onset of his illness.) Patient/Family's Understanding of and Emotional Response to Diagnosis, Current Treatment, and Prognosis:  Limited.  Pt's wife is hopeful that pt will be able to get some rehab and live independently in his own place.  Emotional Assessment Appearance:  Appears older than stated age Attitude/Demeanor/Rapport:  Unable to  Assess Affect (typically observed):  Unable to Assess Orientation:  Oriented to Self Alcohol / Substance use:  Not Applicable Psych involvement (Current and /or in the community):  No (Comment)  Discharge Needs  Concerns to be addressed:  Financial / Insurance Concerns, Discharge Planning Concerns, Homelessness, Lack of Support Readmission within the last 30 days:  Yes Current discharge risk:  Inadequate Financial Supports, Physical Impairment, Lack of support system,  Homeless, Cognitively Impaired Barriers to Discharge:  Continued Medical Work up, No SNF bed, Family Issues, Homeless with medical needs, Unsafe home situation, Inadequate or no insurance   [name], [place], LCSW 04/27/2016, 9:19 PM

## 2016-04-27 NOTE — ED Notes (Signed)
Pt feeding self with some encouragement, repositioned, given fork and spoon, pt eating with no difficulty-- no choking, no coughing with eating or drinking.

## 2016-04-27 NOTE — Clinical Social Work Placement (Signed)
   CLINICAL SOCIAL WORK PLACEMENT  NOTE  Date:  04/27/2016  Patient Details  Name: Dale HollowCraig D Trinka MRN: [id number] Date of Birth: 02/01/1966  Clinical Social Work is seeking post-discharge placement for this patient at the Skilled  Nursing Facility level of care (*CSW will initial, date and re-position this form in  chart as items are completed):  Yes   Patient/family provided with [facility] Clinical Social Work Department's list of facilities offering this level of care within the geographic area requested by the patient (or if unable, by the patient's family).  Yes   Patient/family informed of their freedom to choose among providers that offer the needed level of care, that participate in Medicare, Medicaid or managed care program needed by the patient, have an available bed and are willing to accept the patient.      Patient/family informed of [facility]'s ownership interest in [facility] and [facility], as well as of the fact that they are under no obligation to receive care at these facilities.  PASRR submitted to EDS on       PASRR number received on       Existing PASRR number confirmed on 04/25/16     FL2 transmitted to all facilities in geographic area requested by pt/family on 04/25/16     FL2 transmitted to all facilities within larger geographic area on 04/25/16     Patient informed that his/her managed care company has contracts with or will negotiate with certain facilities, including the following:            Patient/family informed of bed offers received.  Patient chooses bed at       Physician recommends and patient chooses bed at      Patient to be transferred to   on  .  Patient to be transferred to facility by       Patient family notified on   of transfer.  Name of family member notified:        PHYSICIAN       Additional Comment:    _______________________________________________ Linna CapriceRAKE, Lenya Sterne M, LCSW 04/27/2016, 9:45 PM

## 2016-04-27 NOTE — H&P (Signed)
History and Physical  Patient Name: Dale [name]     ZOX:[id number]    DOB: 1966-08-29    DOA: 04/25/2016 PCP: No primary care provider on file.   Patient coming from: Brother's home  Chief Complaint: PICC line failure  HPI: Dale [name] is a 50 y.o. male with a past medical history significant for homelessness, HTN, and recent strep meningitis with complicated course who presents with failure of PICC line and unable to care for self at home.  The patient was homeless been in his usual state of health until March of this year when he presented with septic shock from strep pneumo meningitis.    3/14-4/6: Admitted with strep pneumo meningitis, c/b cardiac arrest, and cardiogenic shock (EF 20%, recovered to 50%) briefly on amiodarone, ARDS requiring prolonged intubation and trach (now removed), ARF, shock liver, and adrenal insufficiency.  He was discharged on 4/6 with weekly Pen G IM until 4/10  4/16-5/1: Readmitted with fall, altered mental status. Repeat LP had no growth, TEE without vegetation, ultimately transitioned to ceftriaxone 2g BID with plans for >4 weeks IV antibiotics.  During this hospitalization, he was planning for SNF placement until the day of discharge when the patient's brother asked to take him home (it appears that this wasn't verified with the patient's estranged but legally married wife).   Since discharge, patient has returned to the ED 4 times because of loss of PICC line, PICC requiring to be flushed, PICC coming out, or patient wandering unattended in the street (he has dementia since his meningitis).    Currently, the patient is continuing on IV antibiotics twice daily via PICC.  His brother with whom he left the hospital last has been hospitalized and is unable to care for him at home.  The patient's POA and wife is also ill chronically and unable to administer his PICC line.  He requires ongoing IV antibiotics and skilled nursing care that cannot be achieved at home.      ED course: He has been cared for in the ER for the last two days, ambulating, toileting himself, making basic needs known and cooperating in self-cares.  He has been afebrile, BP stable, HR normal, saturating well on ambient air.  Mental status at baseline (confused and oriented only to self), no seizures, no fever.      Review of Systems:  Unable to obtain due to patient mentation.    Past Medical History  Diagnosis Date  . Depression   . Cardiac arrest (HCC)   . Cardiogenic shock (HCC)   . Acute respiratory failure with hypoxia (HCC)   . Meningitis, streptococcal 02/16/2016  . 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 Tobacco abuse 02/16/2016  . Substance abuse   . ETOH abuse   . Septic shock [facility])     Past Surgical History  Procedure Laterality Date  . Ankle surgery    . Cardiac catheterization N/A 02/17/2016    Procedure: IABP Insertion;  Surgeon: Iran Ouch, MD;  Location: [facility] INVASIVE CV LAB;  Service: Cardiovascular;  Laterality: N/A;  . Tee without cardioversion N/A 03/29/2016    Procedure: TRANSESOPHAGEAL ECHOCARDIOGRAM (TEE);  Surgeon: Laurey Morale, MD;  Location: [facility] ENDOSCOPY;  Service: Cardiovascular;  Laterality: N/A;    Social History: Patient lives most recently with his brother, before that homeless.  The patient walks unassisted.  Can participate in basic ADLs.    No Known Allergies  Family history:   Unable to obtain due to patient  mentation.  Prior to Admission medications   Medication Sig Start Date End Date Taking? Authorizing Provider  albuterol (PROVENTIL) (2.5 MG/3ML) 0.083% nebulizer solution Take 3 mLs (2.5 mg total) by nebulization every 3 (three) hours as needed for wheezing. Patient taking differently: Take 2.5 mg by nebulization every 3 (three) hours as needed for wheezing or shortness of breath.  03/10/16   Richarda OverlieNayana Abrol, MD  Amino Acids-Protein Hydrolys (FEEDING SUPPLEMENT, PRO-STAT SUGAR FREE 64,) LIQD Take 30 mLs by mouth 3 (three)  times daily with meals. 04/04/16   Alison MurrayAlma M Devine, MD  aspirin 325 MG tablet Place 1 tablet (325 mg total) into feeding tube daily. 04/04/16   Alison MurrayAlma M Devine, MD  atorvastatin (LIPITOR) 20 MG tablet Take 1 tablet (20 mg total) by mouth daily at 6 PM. 04/04/16   Alison MurrayAlma M Devine, MD  carvedilol (COREG) 3.125 MG tablet Place 1 tablet (3.125 mg total) into feeding tube 2 (two) times daily with a meal. 04/04/16   Alison MurrayAlma M Devine, MD  cefTRIAXone 2 g in dextrose 5 % 50 mL Inject 2 g into the vein every 12 (twelve) hours. 04/05/16 04/30/16  Alison MurrayAlma M Devine, MD  famotidine (PEPCID) 40 MG/5ML suspension Place 2.5 mLs (20 mg total) into feeding tube daily. 04/04/16   Alison MurrayAlma M Devine, MD  feeding supplement, ENSURE ENLIVE, (ENSURE ENLIVE) LIQD Take 237 mLs by mouth every 6 (six) hours. 04/04/16   Alison MurrayAlma M Devine, MD  folic acid (FOLVITE) 1 MG tablet Place 1 tablet (1 mg total) into feeding tube daily. 04/04/16   Alison MurrayAlma M Devine, MD  levETIRAcetam (KEPPRA) 100 MG/ML solution Place 5 mLs (500 mg total) into feeding tube 2 (two) times daily. 04/04/16   Alison MurrayAlma M Devine, MD  Maltodextrin-Xanthan Gum (RESOURCE THICKENUP CLEAR) POWD Take 120 g by mouth as needed. 04/04/16   Alison MurrayAlma M Devine, MD  predniSONE (DELTASONE) 5 MG tablet Place 1 tablet (5 mg total) into feeding tube daily with breakfast. 04/04/16   Alison MurrayAlma M Devine, MD  tamsulosin (FLOMAX) 0.4 MG CAPS capsule Take 1 capsule (0.4 mg total) by mouth daily. 04/04/16   Alison MurrayAlma M Devine, MD  thiamine 100 MG tablet Place 1 tablet (100 mg total) into feeding tube daily. 04/04/16   Alison MurrayAlma M Devine, MD  valproic acid (DEPAKENE) 250 MG/5ML syrup Place 5 mLs (250 mg total) into feeding tube every 8 (eight) hours. 04/04/16   Alison MurrayAlma M Devine, MD       Physical Exam: BP 109/78 mmHg  Pulse 58  Temp(Src) 98 F (36.7 C) (Oral)  Resp 18  SpO2 100% General appearance: Thin adult male, arousable and in no acute distress.  Pleasantly confused.  Ambulatory.   Eyes: Anicteric, conjunctiva pink, lids and lashes normal.     ENT:  No nasal deformity, discharge, or epistaxis.  OP moist without lesions.  Edentulous.  No tracheostomy.   Lymph: No cervical or supraclavicular lymphadenopathy. Skin: Warm and dry.  No jaundice.  No suspicious rashes or lesions.   Cardiac: RRR, nl S1-S2, no murmurs appreciated.  Capillary refill is brisk.  JVP normal.  No LE edema.  Radial and DP pulses 2+ and symmetric. Respiratory: Normal respiratory rate and rhythm.  CTAB without rales or wheezes. Abdomen: Abdomen soft without rigidity.  No TTP. No ascites, distension.  No PEG. MSK: No deformities or effusions.  PICC in right arm, appears clean and intact. Neuro: Cranial nerves 3-12 grossly intact.  Follows simple commands slowly.  Speaks but is incoherent for most questions (  where are you: "three...", what year is it: "2005").  Able to recall his wife's name.  Attention wanders.  DTRs at patella 1+ and symmetric.  Moves all extremities equally and with normal coordination, slowly, strength 5/5 in upper and lower extremities.    Psych:  Affect slowed and blunted.  No evidence of aural or visual hallucinations or delusions.   Otherwise unable to assess.        Assessment/Plan 1. Meningitis, streptococcal:  Original CSF culture  From 3/14 grew ceftriaxone susceptible, penicillin resistant S pneumo.  Repeat CSF culture on 4/16 had PMNs but no growth at 3 days.   -Ceftriaxone 2g BID via PICC -Will obtain BMP now and weekly while on ceftriaxone -CBC once now and if status changes   2. Malnutrition of severe degree:  -Continue nutritional supplement daily  3. Dementia:  -Continue thiamine and folate  4. BPH:  -Hold tamsulosin, may restart at discharge, if symptoms  5. Adrenal insufficiency:  Patient had completed steroids for meningitis shortly after discharge, was still on prednisone when he arrived in ER this week.  Prednisone was tapered off in the ER, BP has been adequate, so will defer restarting.          DVT prophylaxis:  Lovenox  Code Status: FULL  Family Communication: None present  Disposition Plan: Anticipate SNF placement, per CSW Consults called: None Admission status: Observation, med surg   Medical decision making: Patient seen at 10:06 PM on 04/27/2016.  The patient was discussed with Dr. Dalene Seltzer and Dr. Jacky Kindle from [facility] [place]. What exists of the patient's chart was reviewed in depth.  Clinical condition: stable.  The patient has had streptococcal meningitis, including relapse once resulting in septic shock, ARF, ARDS requiring tracheostomy and feeding tube (both now removed), shock liver, cardiogenic shock and permanent dementia who requires prolonged IV antibiotics via PICC line and has been seen in the ER already 4 times since discharge because of failure to properly handle/administer antibiotics via PICC at home, and who has no able-bodied family to administer antibiotics such that he requires nursing care for administering antibiotics.  Will admit now and continue pursuit of SNF placement.        Alberteen Sam [facility] Pager [phone]

## 2016-04-27 NOTE — Care Management (Addendum)
As per Dr. Ninetta LightsHatcher from ID note 03/28/16 , patient has Recurrent meningitis/brain abscess and will need up to 4 weeks of IV ABX therapy. Patient has not receive the therapy because he was signed out of SNF by patient's brother, undetermined how many doses of antibiotic patient actually received.  Patient has PICC and is now  IV ABX therapy in the ED, CM spoke with Dr Dalene SeltzerSchlossman EDP concerning possible admission to receive antibiotics. CM and CSW will continue to follow for transitional care plan.

## 2016-04-27 NOTE — Progress Notes (Signed)
CSW was informed by Patient's wife that Patient currently has warrants out for his arrest due to failure to appear. She reports Patient was hospitalized at the time of his court date. Patient's wife reports that Patient was charged with driving while license revoked, fictitious tags, and driving with no insurance resulting in the court date. CSW has staffed with Medical Director as Patient is difficult to place. Medical Director instructed CSW to [facility] and left a voice message with admissions coordinator. CSW also left a message for [employer]- Admissions coordinator at [facility]. Per previous admission, Patient's stop date for IV antibiotic is 04/29/2016.          Lance MussAshley Gardner,MSW, LCSW [facility] ED/[facility]M Clinical Social Worker [phone]

## 2016-04-27 NOTE — ED Notes (Signed)
Pt's wife here talking with social work regarding placement.

## 2016-04-27 NOTE — Progress Notes (Signed)
[facility] has been [facility].  Call placed to [facility]/message left re: bed availability.  CSW will continue to follow for disposition.

## 2016-04-27 NOTE — ED Notes (Addendum)
Water, jello, and graham crackers placed on bedside table. A regular diet ordered for lunch.

## 2016-04-27 NOTE — Progress Notes (Addendum)
New Admission Note:   Arrival Method: via bed from ED Mental Orientation: Alert and oriented x1; able to state name Telemetry: N/A Assessment: Completed Skin: warm, dry, and intact IV: Right Brachial PICC  Pain: N/A Tubes: N/A Safety Measures: Educated on fall prevention safety plan, patient acknowledged and understood. Admission: Completed [address] [place] Orientation: Patient has been orientated to the room, unit and staff.  Family: N/A  No family at bedside at this time. Patient unable to answer healthy history or admission questions at this time due to cognitive impairment and confusion. Will report to next shift if family arrives to obtain information. Orders have been reviewed and implemented. Will continue to monitor the patient. Call light has been placed within reach and bed alarm has been activated.     Dale FischerLynn Amandeep Hogston, RN  Phone number: [phone]

## 2016-04-28 DIAGNOSIS — G002 Streptococcal meningitis: Principal | ICD-10-CM

## 2016-04-28 DIAGNOSIS — Z7982 Long term (current) use of aspirin: Secondary | ICD-10-CM | POA: Diagnosis not present

## 2016-04-28 DIAGNOSIS — F0390 Unspecified dementia without behavioral disturbance: Secondary | ICD-10-CM | POA: Diagnosis present

## 2016-04-28 DIAGNOSIS — E870 Hyperosmolality and hypernatremia: Secondary | ICD-10-CM | POA: Diagnosis present

## 2016-04-28 DIAGNOSIS — R414 Neurologic neglect syndrome: Secondary | ICD-10-CM | POA: Diagnosis present

## 2016-04-28 DIAGNOSIS — F329 Major depressive disorder, single episode, unspecified: Secondary | ICD-10-CM | POA: Diagnosis present

## 2016-04-28 DIAGNOSIS — Z8661 Personal history of infections of the central nervous system: Secondary | ICD-10-CM | POA: Diagnosis not present

## 2016-04-28 DIAGNOSIS — R339 Retention of urine, unspecified: Secondary | ICD-10-CM | POA: Diagnosis present

## 2016-04-28 DIAGNOSIS — F17210 Nicotine dependence, cigarettes, uncomplicated: Secondary | ICD-10-CM | POA: Diagnosis present

## 2016-04-28 DIAGNOSIS — I5042 Chronic combined systolic (congestive) and diastolic (congestive) heart failure: Secondary | ICD-10-CM | POA: Diagnosis present

## 2016-04-28 DIAGNOSIS — Z8674 Personal history of sudden cardiac arrest: Secondary | ICD-10-CM | POA: Diagnosis not present

## 2016-04-28 DIAGNOSIS — Z79899 Other long term (current) drug therapy: Secondary | ICD-10-CM | POA: Diagnosis not present

## 2016-04-28 DIAGNOSIS — E860 Dehydration: Secondary | ICD-10-CM | POA: Diagnosis present

## 2016-04-28 DIAGNOSIS — I110 Hypertensive heart disease with heart failure: Secondary | ICD-10-CM | POA: Diagnosis present

## 2016-04-28 DIAGNOSIS — G40909 Epilepsy, unspecified, not intractable, without status epilepticus: Secondary | ICD-10-CM | POA: Diagnosis present

## 2016-04-28 DIAGNOSIS — Z590 Homelessness: Secondary | ICD-10-CM | POA: Diagnosis not present

## 2016-04-28 DIAGNOSIS — E785 Hyperlipidemia, unspecified: Secondary | ICD-10-CM | POA: Diagnosis present

## 2016-04-28 DIAGNOSIS — R1310 Dysphagia, unspecified: Secondary | ICD-10-CM | POA: Diagnosis present

## 2016-04-28 LAB — CBC WITH DIFFERENTIAL/PLATELET
BASOS PCT: 0 %
Basophils Absolute: 0 10*3/uL (ref 0.0–0.1)
Eosinophils Absolute: 0 10*3/uL (ref 0.0–0.7)
Eosinophils Relative: 1 %
HCT: 31.3 % — ABNORMAL LOW (ref 39.0–52.0)
Hemoglobin: 10 g/dL — ABNORMAL LOW (ref 13.0–17.0)
LYMPHS PCT: 65 %
Lymphs Abs: 2.4 10*3/uL (ref 0.7–4.0)
MCH: 28.7 pg (ref 26.0–34.0)
MCHC: 31.9 g/dL (ref 30.0–36.0)
MCV: 89.9 fL (ref 78.0–100.0)
MONO ABS: 0.6 10*3/uL (ref 0.1–1.0)
MONOS PCT: 16 %
NEUTROS ABS: 0.6 10*3/uL — AB (ref 1.7–7.7)
Neutrophils Relative %: 18 %
PLATELETS: 245 10*3/uL (ref 150–400)
RBC: 3.48 MIL/uL — AB (ref 4.22–5.81)
RDW: 15.3 % (ref 11.5–15.5)
WBC: 3.6 10*3/uL — ABNORMAL LOW (ref 4.0–10.5)

## 2016-04-28 LAB — BASIC METABOLIC PANEL
ANION GAP: 8 (ref 5–15)
BUN: 19 mg/dL (ref 6–20)
CALCIUM: 8.9 mg/dL (ref 8.9–10.3)
CO2: 31 mmol/L (ref 22–32)
Chloride: 100 mmol/L — ABNORMAL LOW (ref 101–111)
Creatinine, Ser: 1.23 mg/dL (ref 0.61–1.24)
GFR calc Af Amer: >60 mL/min (ref >60)
GFR calc non Af Amer: >60 mL/min (ref >60)
GLUCOSE: 88 mg/dL (ref 65–99)
POTASSIUM: 3.5 mmol/L (ref 3.5–5.1)
Sodium: 139 mmol/L (ref 135–145)

## 2016-04-28 LAB — MRSA PCR SCREENING: MRSA by PCR: NEGATIVE

## 2016-04-28 LAB — PATHOLOGIST SMEAR REVIEW

## 2016-04-28 MED ORDER — SODIUM CHLORIDE 0.9% FLUSH
10.0000 mL | INTRAVENOUS | Status: DC | PRN
  Administered 2016-04-28 – 2016-04-29 (×4): 10 mL
  Filled 2016-04-28 (×4): qty 40

## 2016-04-28 NOTE — Progress Notes (Signed)
PROGRESS NOTE    Dale [name]  ZOX:[id number] DOB: [birth year] DOA: 04/25/2016 PCP: No primary care provider on file.   Brief Narrative:  Dale [name] is a 50 y.o. male with a past medical history significant for homelessness, HTN, and recent strep meningitis with complicated course who presents with failure of PICC line and unable to care for self at home.  The patient was homeless been in his usual state of health until March of this year when he presented with septic shock from strep pneumo meningitis.   Assessment & Plan:   Principal Problem:   Meningitis, streptococcal - Continue Rocephin  Active Problems:   Protein-calorie malnutrition, severe (HCC) - Continue nutritional supplementation    Dementia  DVT prophylaxis: Lovenox Code Status: Full Family Communication: None at bedside Disposition Plan: Once placement is found we'll plan on transitioning patient.   Consultants:   None    Antimicrobials: rocephin   Subjective: Pt has no new complaints.  Objective: Filed Vitals:   04/27/16 2338 04/28/16 0416 04/28/16 0842 04/28/16 1658  BP: 93/61  Pulse: 66 67 66 72  Temp: 98.3 F (36.8 C) 97.6 F (36.4 C) 98 F (36.7 C) 97.8 F (36.6 C)  TempSrc: Axillary Oral Oral Axillary  Resp: Weight: 55.4 kg (122 lb 2.2 oz)     SpO2: 100% 100% 100% 100%    Intake/Output Summary (Last 24 hours) at 04/28/16 1707 Last data filed at 04/28/16 1500  Gross per 24 hour  Intake    960 ml  Output    900 ml  Net     60 ml   Filed Weights   04/27/16 2338  Weight: 55.4 kg (122 lb 2.2 oz)    Examination:  General exam: Appears calm and comfortable  Respiratory system: Clear to auscultation. Respiratory effort normal. Cardiovascular system: no cyanosis Gastrointestinal system: Abdomen is nondistended, no guarding Skin: No rashes on limited exam   Data Reviewed: I have personally reviewed following labs and imaging  studies  CBC:  Recent Labs Lab 04/27/16 2349  WBC 3.6*  NEUTROABS 0.6*  HGB 10.0*  HCT 31.3*  MCV 89.9  PLT 245   Basic Metabolic Panel:  Recent Labs Lab 04/27/16 2349  NA 139  K 3.5  CL 100*  CO2 31  GLUCOSE 88  BUN 19  CREATININE 1.23  CALCIUM 8.9   GFR: Estimated Creatinine Clearance: 55.7 mL/min (by C-G formula based on Cr of 1.23). Liver Function Tests: No results for input(s): AST, ALT, ALKPHOS, BILITOT, PROT, ALBUMIN in the last 168 hours. No results for input(s): LIPASE, AMYLASE in the last 168 hours. No results for input(s): AMMONIA in the last 168 hours. Coagulation Profile: No results for input(s): INR, PROTIME in the last 168 hours. Cardiac Enzymes: No results for input(s): CKTOTAL, CKMB, CKMBINDEX, TROPONINI in the last 168 hours. BNP (last 3 results) No results for input(s): PROBNP in the last 8760 hours. HbA1C: No results for input(s): HGBA1C in the last 72 hours. CBG: No results for input(s): GLUCAP in the last 168 hours. Lipid Profile: No results for input(s): CHOL, HDL, LDLCALC, TRIG, CHOLHDL, LDLDIRECT in the last 72 hours. Thyroid Function Tests: No results for input(s): TSH, T4TOTAL, FREET4, T3FREE, THYROIDAB in the last 72 hours. Anemia Panel: No results for input(s): VITAMINB12, FOLATE, FERRITIN, TIBC, IRON, RETICCTPCT in the last 72 hours. Sepsis Labs: No results for input(s): PROCALCITON, LATICACIDVEN in the last 168 hours.  Recent Results (from the  past 240 hour(s))  MRSA PCR Screening     Status: None   Collection Time: 04/28/16 12:15 AM  Result Value Ref Range Status   MRSA by PCR NEGATIVE NEGATIVE Final    Comment:        The GeneXpert MRSA Assay (FDA approved for NASAL specimens only), is one component of a comprehensive MRSA colonization surveillance program. It is not intended to diagnose MRSA infection nor to guide or monitor treatment for MRSA infections.          Radiology Studies: No results  found.      Scheduled Meds: . aspirin  325 mg Oral Daily  . atorvastatin  20 mg Oral q1800  . carvedilol  3.125 mg Oral BID WC  . cefTRIAXone (ROCEPHIN) IVPB 2 gram/50 mL D5W (Pyxis)  2 g Intravenous Q12H  . divalproex  250 mg Oral Q8H  . enoxaparin (LOVENOX) injection  40 mg Subcutaneous Daily  . feeding supplement (ENSURE ENLIVE)  237 mL Oral Q6H  . folic acid  1 mg Per Tube Daily  . levETIRAcetam  500 mg Oral BID  . thiamine  100 mg Per Tube Daily   Continuous Infusions:    LOS: 0 days    Time spent: 10 min    Penny [name], Dale Fulmore, MD [facility] Pager [phone]  If 7PM-7AM, please contact night-coverage [email or web address] Password [email or web address] 04/28/2016, 5:07 PM

## 2016-04-28 NOTE — ED Notes (Signed)
Patient's family has been contacted about meds. Please call pharmacy if the family arrives. Ext [phone]

## 2016-04-29 LAB — GLUCOSE, CAPILLARY: Glucose-Capillary: 85 mg/dL (ref 65–99)

## 2016-04-29 MED ORDER — DEXTROSE 5 % IV SOLN: 2.0000 g | Freq: Two times a day (BID) | INTRAVENOUS | Status: AC

## 2016-04-29 MED ORDER — HEPARIN SOD (PORK) LOCK FLUSH 100 UNIT/ML IV SOLN
250.0000 [IU] | INTRAVENOUS | Status: AC | PRN
  Administered 2016-04-29: 250 [IU]

## 2016-04-29 NOTE — Clinical Social Work Placement (Signed)
   CLINICAL SOCIAL WORK PLACEMENT  NOTE 04/29/16 - DISCHARGED TO [facility]  Date:  04/29/2016  Patient Details  Name: Dale HollowCraig D Menken MRN: [id number] Date of Birth: 03/27/1966  Clinical Social Work is seeking post-discharge placement for this patient at the Skilled  Nursing Facility level of care (*CSW will initial, date and re-position this form in  chart as items are completed):  Yes   Patient/family provided with [facility] Clinical Social Work Department's list of facilities offering this level of care within the geographic area requested by the patient (or if unable, by the patient's family).  Yes   Patient/family informed of their freedom to choose among providers that offer the needed level of care, that participate in Medicare, Medicaid or managed care program needed by the patient, have an available bed and are willing to accept the patient.      Patient/family informed of 's ownership interest in [facility] and [facility], as well as of the fact that they are under no obligation to receive care at these facilities.  PASRR submitted to EDS on       PASRR number received on       Existing PASRR number confirmed on 04/25/16     FL2 transmitted to all facilities in geographic area requested by pt/family on 04/25/16     FL2 transmitted to all facilities within larger geographic area on 04/25/16     Patient informed that his/her managed care company has contracts with or will negotiate with certain facilities, including the following:         YES - Patient/family informed of bed offers received.  [facility]     Physician recommends and patient chooses bed at      [facility]  [facility].  Patient to be transferred to facility by  ambulance     Patient family notified on  04/29/16 of transfer.  Name of family member notified:   Wife-Shirley Garlick at the bedside.       PHYSICIAN       Additional Comment:    _______________________________________________ Cristobal Goldmannrawford, Azana Kiesler Bradley, LCSW 04/29/2016, 6:08 PM

## 2016-04-29 NOTE — Progress Notes (Signed)
Patient's wife called and explained that she didn't want any other family knowing further medical information on the patient because her family "hasn't been doing anything for him." I set up a password with the patients wife "CJ"

## 2016-04-29 NOTE — Progress Notes (Signed)
Dale [name] to be D/C'd Skilled nursing facility per MD order.  Discussed prescriptions and follow up appointments with the patient. Prescriptions given to patient, medication list explained in detail. Pt verbalized understanding.    Medication List    TAKE these medications        albuterol (2.5 MG/3ML) 0.083% nebulizer solution  Commonly known as:  PROVENTIL  Take 3 mLs (2.5 mg total) by nebulization every 3 (three) hours as needed for wheezing.     aspirin 325 MG tablet  Place 1 tablet (325 mg total) into feeding tube daily.     atorvastatin 20 MG tablet  Commonly known as:  LIPITOR  Take 1 tablet (20 mg total) by mouth daily at 6 PM.     carvedilol 3.125 MG tablet  Commonly known as:  COREG  Place 1 tablet (3.125 mg total) into feeding tube 2 (two) times daily with a meal.     cefTRIAXone 2 g in dextrose 5 % 50 mL  Inject 2 g into the vein every 12 (twelve) hours.     famotidine 40 MG/5ML suspension  Commonly known as:  PEPCID  Place 2.5 mLs (20 mg total) into feeding tube daily.     feeding supplement (ENSURE ENLIVE) Liqd  Take 237 mLs by mouth every 6 (six) hours.     folic acid 1 MG tablet  Commonly known as:  FOLVITE  Place 1 tablet (1 mg total) into feeding tube daily.     levETIRAcetam 100 MG/ML solution  Commonly known as:  KEPPRA  Place 5 mLs (500 mg total) into feeding tube 2 (two) times daily.     tamsulosin 0.4 MG Caps capsule  Commonly known as:  FLOMAX  Take 1 capsule (0.4 mg total) by mouth daily.     thiamine 100 MG tablet  Place 1 tablet (100 mg total) into feeding tube daily.     valproic acid 250 MG/5ML syrup  Commonly known as:  DEPAKENE  Place 5 mLs (250 mg total) into feeding tube every 8 (eight) hours.        Filed Vitals:   04/29/16 0623 04/29/16 1100  BP: 99/61 103/72  Pulse: 70 69  Temp: 98.7 F (37.1 C) 98.2 F (36.8 C)  Resp: 16 20    Skin clean, dry and intact without evidence of skin break down, no evidence of skin  tears noted. IV catheter discontinued intact. Site without signs and symptoms of complications. Dressing and pressure applied. Pt denies pain at this time. No complaints noted.  An After Visit Summary was printed and given to the patient. Patient escorted via stretcher, and D/C home via ambulance.  Janeann ForehandLuke Leonte Horrigan BSN, RN

## 2016-04-29 NOTE — Discharge Summary (Signed)
Physician Discharge Summary  Dale [name] QIO:[id number] DOB: 27-Jan-1966 DOA: 04/25/2016  PCP: No primary care provider on file. MD in SNF  Admit date: 04/25/2016 Discharge date: 04/29/2016  Recommendations for Outpatient Follow-up:   Discharge instructions    Complete by:  As directed  Stop date for Rocephin is 04/29/2016 but will continue for another week and have patient f/u with ID prior to discontinuing.  Patient must see infectious disease before stopping Rocephin       Discharge Diagnoses:  Principal Problem:   Meningitis, streptococcal Active Problems:   Protein-calorie malnutrition, severe (HCC)   Dementia   Streptococcal meningitis   Discharge Condition: stable   Diet recommendation: Dysphagia 1 diet   History of present illness:   Per brief narrative 04/03/2016 "50 yo M with hx homelessness, admitted 3-14 to 03-10-16 with streptococcal (CSF Pneumococcus R-PEN) meningitis. His course was complicated by v-fib and respiratory arrest, as well as ARF due to ATN. He received 14 days of ceftriaxone, and weekly IM Pen G (for positive RPR 1:2). He was d/c to SNF. Non-verbal. At SNF his mental status was stable.  He returned to the ED on 4-16 after being found down. He was hypothermic. He was admitted to critical care on 4/16 with Severe Sepsis and transferred to hospitalist service on 4/19.  He was started on vanco/ceftriaxone. He had repeat LP showing 400 WBC (75%N), Prot > 600, Glc 37. He had solumedrol added. His CT head suggested subacute infarcts of basal ganglia. MRI also showed these and suggested them as residual from his previous meningitis. (per Dr. 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 - Patient initially on vancomycin and Rocephin. Vancomycin stopped  per infectious disease recommendations and we continued Rocephin. - Patient will continue Rocephin for another 7 days after discharge, must see ID prior to stopping Rocephin. - PICC line in place  - Please note TEE was unremarkable - Sepsis etiology resolved  Acute encephalopathy with right-sided hemi-neglect with L Corona Radiata infarct & Basal ganglia abnormalities noted on MRI - No significant changes in mental status - Patient seen by neurology in consultation - Their recommendation is to continue to treat for meningitis - [facility] discussed the case with neurologist Dr. Roda Shutters on 04/01/2016, per neurology rapidly taper off steroids, switched to oral prednisone with rapid taper on last admission  Seizure disorder - Continue Keppra  Urinary retention - Continue Flomax  Dyslipidemia - Continue  statin.  Dysphagia - Dysphagia 1 diet per SLP  Severe protein calorie malnutrition - On protein supplementation.  Hypernatremia and ARF - Due to dehydration.  - Resolved with IV fluids.  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 Compensated. Currently on beta blocker, aspirin and statin.  Low a.m. cortisol  - Was 1.2 checked on 04/03/2016  - This likely is reflective of cortisol suppression from steroid use - Tapering steroids as noted above, 2 more dose on discharge and then can be stopped   DVT Prophylaxis:Lovenox  Code Status : Full Family Communication; Family not at the bedside this am   Consults   Neurology   ID  Palliative care  Procedures  4/18 TTE - EF 45-50% - diffuse hypokinesis - grade 1 diastolic dysfunction.   Signed:  Penny Pia, MD  [facility] 04/29/2016, 3:41 PM  Pager #: [phone]  Time spent in minutes: more than 30 minutes   Discharge Exam: Filed Vitals:   04/29/16 0623 04/29/16 1100  BP: 99/61 103/72  Pulse: 70 69  Temp: 98.7 F (37.1 C) 98.2 F (36.8 C)  Resp: 16 20   Filed Vitals:   04/28/16 1658  04/28/16 2115 04/29/16 0623 04/29/16 1100  BP: 93/61 133/84 99/61 103/72  Pulse: 72 73 70 69  Temp: 97.8 F (36.6 C) 98 F (36.7 C) 98.7 F (37.1 C) 98.2 F (36.8 C)  TempSrc: Axillary Oral Oral Oral  Resp: 18 18 16 20   Weight:      SpO2: 100% 100% 99% 100%    General: Pt is not in acute distress Cardiovascular: Regular rate and rhythm, S1/S2 +, no murmurs Respiratory: Clear to auscultation bilaterally, no wheezing, no crackles, no rhonchi Abdominal: Soft, non tender, non distended, bowel sounds +, no guarding Extremities: no edema, no cyanosis, pulses palpable bilaterally DP and PT Neuro: Grossly nonfocal  Discharge Instructions  Discharge Instructions    Call MD for:  difficulty breathing, headache or visual disturbances    Complete by:  As directed      Call MD for:  persistant nausea and vomiting    Complete by:  As directed      Call MD for:  severe uncontrolled pain    Complete by:  As directed      Diet general    Complete by:  As directed   Dysphagia 1     Discharge instructions    Complete by:  As directed   Stop date for Rocephin is 04/29/2016 Patient must see infectious disease before stopping Rocephin     Increase activity slowly    Complete by:  As directed             Medication List    TAKE these medications        albuterol (2.5 MG/3ML) 0.083% nebulizer solution  Commonly known as:  PROVENTIL  Take 3 mLs (2.5 mg total) by nebulization every 3 (three) hours as needed for wheezing.     aspirin 325 MG tablet  Place 1 tablet (325 mg total) into feeding tube daily.     atorvastatin 20 MG tablet  Commonly known as:  LIPITOR  Take 1 tablet (20 mg total) by mouth daily at 6 PM.     carvedilol 3.125 MG tablet  Commonly known as:  COREG  Place 1 tablet (3.125 mg total) into feeding tube 2 (two) times daily with a meal.     cefTRIAXone 2 g in dextrose 5 % 50 mL  Inject 2 g into the vein every 12 (twelve) hours.     famotidine 40 MG/5ML suspension   Commonly known as:  PEPCID  Place 2.5 mLs (20 mg total) into feeding tube daily.     feeding supplement (ENSURE ENLIVE) Liqd  Take 237 mLs by mouth every 6 (six) hours.     folic acid 1 MG tablet  Commonly known as:  FOLVITE  Place 1 tablet (1 mg total) into feeding tube daily.     levETIRAcetam 100 MG/ML solution  Commonly known as:  KEPPRA  Place 5 mLs (500 mg total) into feeding tube 2 (two) times daily.     tamsulosin 0.4 MG Caps capsule  Commonly known as:  FLOMAX  Take 1 capsule (0.4 mg total) by mouth daily.     thiamine 100 MG tablet  Place 1 tablet (100 mg total) into feeding tube daily.  valproic acid 250 MG/5ML syrup  Commonly known as:  DEPAKENE  Place 5 mLs (250 mg total) into feeding tube every 8 (eight) hours.          The results of significant diagnostics from this hospitalization (including imaging, microbiology, ancillary and laboratory) are listed below for reference.    Significant Diagnostic Studies: Ct Angio Head W/cm &/or Wo Cm 03/22/2016   Circle of Willis patent without focal stenosis or branch occlusion. Anterior and middle cerebral arteries show diffuse narrowing which may be due to basilar meningitis or vasospasm. This is a subjective findings and is diffuse. Negative CTA neck Subtle enhancement in the left basal ganglia and right parietal cortex corresponding to areas of abnormal enhancement on the MRI. 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 Electronically Signed   By: Marlan Palau M.D.   On: 03/22/2016 12:24   Dg Chest 1 View 03/05/2016   Stable support apparatus. Improved bibasilar opacities consistent with improving edema. Electronically Signed   By: Lupita Raider, M.D.   On: 03/05/2016 11:58   Ct Head Wo Contrast 03/20/2016   Interval development of hypodensities within the left-greater-than-right basal ganglia which may represent  subacute infarcts. No acute cervical spine fracture. Electronically Signed   By: Annia Belt M.D.   On: 03/20/2016 19:15   Ct Angio Neck W/cm &/or Wo/cm 03/22/2016   Circle of Willis patent without focal stenosis or branch occlusion. Anterior and middle cerebral arteries show diffuse narrowing which may be due to basilar meningitis or vasospasm. This is a subjective findings and is diffuse. Negative CTA neck Subtle enhancement in the left basal ganglia and right parietal cortex corresponding to areas of abnormal enhancement on the MRI. 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 Electronically Signed   By: Marlan Palau M.D.   On: 03/22/2016 12:24   Ct Cervical Spine Wo Contrast 03/20/2016   Interval development of hypodensities within the left-greater-than-right basal ganglia which may represent subacute infarcts. No acute cervical spine fracture. Electronically Signed   By: Annia Belt M.D.   On: 03/20/2016 19:15   Mr Brain Wo Contrast 03/24/2016   New area of acute infarct in the left corona radiata. Multiple areas of restricted diffusion in the basal ganglia left greater than right show mild interval improvement since the prior MRI suggesting late subacute infarct. Improvement in restricted diffusion in the right parietal cortex. Small amount of hemorrhage in the left external capsule. Mild amount of intraventricular hemorrhage. These are unchanged from the MRI of 03/21/2016 although were not appreciated on the prior study due to motion. These results were called by telephone at the time of interpretation on 03/24/2016 at 1:36 pm to [facility] PA , who verbally acknowledged these results. Electronically Signed   By: Marlan Palau M.D.   On: 03/24/2016 13:37   Mr Laqueta Jean ZO Contrast 03/21/2016  Image quality degraded by significant motion. There is extensive abnormal enhancement in the basal ganglia  bilaterally. Some of these areas show restricted diffusion left basal ganglia. The enhancement pattern and vascular territory suggest the enhancement is related to subacute infarct related to meningitis 1 month ago. Similar area of enhancement and restricted diffusion in the right parietal cortex. No intracranial hemorrhage. Electronically Signed   By: Marlan Palau M.D.   On: 03/21/2016 19:06   Dg Chest Portable 1 View  03/20/2016   No active disease. Electronically Signed   By: Alcide CleverMark  Lukens M.D.   On: 03/20/2016 18:16   Dg Abd Portable 1v 03/29/2016  Distal tip of feeding tube seen in expected position of proximal duodenum. No evidence of bowel obstruction or ileus. Electronically Signed   By: Lupita RaiderJames  Green Jr, M.D.   On: 03/29/2016 12:44   Dg Abd Portable 1v 03/25/2016  Plain film of the upper abdomen demonstrates weighted metallic tip feeding tube terminating within the stomach, with the tip terminating towards the cardia. Signed, Yvone NeuJaime S. Loreta AveWagner, DO [facility] Electronically Signed   By: Gilmer MorJaime  Wagner D.O.   On: 03/25/2016 11:44   Dg Abd Portable 1v 03/23/2016   Tip of the weighted enteric tube is post pyloric in the region of the third portion of the duodenum. Electronically Signed   By: Rubye OaksMelanie  Ehinger M.D.   On: 03/23/2016 19:53   Dg Swallowing Func-speech Pathology 03/31/2016 [facility] IP DIET RECOMMENDATION 03/31/2016 SLP Diet Recommendations Dysphagia 1 (Puree) solids;Nectar thick liquid Liquid Administration via Cup;Straw Medication Administration Crushed with puree Compensations Slow rate;Small sips/bites Postural Changes   Dg Swallowing Func-speech Pathology 03/07/2016   RECOMMENDATION 03/07/2016 [facility] Diet Recommendations Dysphagia 1 (Puree) solids;Nectar thick liquid Liquid Administration via Cup;No straw Medication Administration Crushed with puree             Microbiology: Recent Results (from the past 240 hour(s))  MRSA PCR Screening      Status: None   Collection Time: 04/28/16 12:15 AM  Result Value Ref Range Status   MRSA by PCR NEGATIVE NEGATIVE Final    Comment:        The GeneXpert MRSA Assay (FDA approved for NASAL specimens only), is one component of a comprehensive MRSA colonization surveillance program. It is not intended to diagnose MRSA infection nor to guide or monitor treatment for MRSA infections.      Labs: Basic Metabolic Panel:  Recent Labs Lab 04/27/16 2349  NA 139  K 3.5  CL 100*  CO2 31  GLUCOSE 88  BUN 19  CREATININE 1.23  CALCIUM 8.9   Liver Function Tests: No results for input(s): AST, ALT, ALKPHOS, BILITOT, PROT, ALBUMIN in the last 168 hours. No results for input(s): LIPASE, AMYLASE in the last 168 hours. No results for input(s): AMMONIA in the last 168 hours. CBC:  Recent Labs Lab 04/27/16 2349  WBC 3.6*  NEUTROABS 0.6*  HGB 10.0*  HCT 31.3*  MCV 89.9  PLT 245   Cardiac Enzymes: No results for input(s): CKTOTAL, CKMB, CKMBINDEX, TROPONINI in the last 168 hours. BNP: BNP (last 3 results)  Recent Labs  03/20/16 1800  BNP 23.8    ProBNP (last 3 results) No results for input(s): PROBNP in the last 8760 hours.  CBG: No results for input(s): GLUCAP in the last 168 hours.

## 2016-05-04 ENCOUNTER — Non-Acute Institutional Stay (SKILLED_NURSING_FACILITY): Payer: Medicaid Other | Admitting: Nurse Practitioner

## 2016-05-04 ENCOUNTER — Encounter: Payer: Self-pay | Admitting: Nurse Practitioner

## 2016-05-04 DIAGNOSIS — R339 Retention of urine, unspecified: Secondary | ICD-10-CM

## 2016-05-04 DIAGNOSIS — E2740 Unspecified adrenocortical insufficiency: Secondary | ICD-10-CM | POA: Diagnosis not present

## 2016-05-04 DIAGNOSIS — E785 Hyperlipidemia, unspecified: Secondary | ICD-10-CM | POA: Diagnosis not present

## 2016-05-04 DIAGNOSIS — G9340 Encephalopathy, unspecified: Secondary | ICD-10-CM | POA: Diagnosis not present

## 2016-05-04 DIAGNOSIS — R1310 Dysphagia, unspecified: Secondary | ICD-10-CM

## 2016-05-04 DIAGNOSIS — A409 Streptococcal sepsis, unspecified: Secondary | ICD-10-CM | POA: Diagnosis not present

## 2016-05-04 DIAGNOSIS — R569 Unspecified convulsions: Secondary | ICD-10-CM

## 2016-05-04 DIAGNOSIS — I6330 Cerebral infarction due to thrombosis of unspecified cerebral artery: Secondary | ICD-10-CM

## 2016-05-04 DIAGNOSIS — E870 Hyperosmolality and hypernatremia: Secondary | ICD-10-CM | POA: Diagnosis not present

## 2016-05-04 DIAGNOSIS — E43 Unspecified severe protein-calorie malnutrition: Secondary | ICD-10-CM

## 2016-05-04 DIAGNOSIS — I5020 Unspecified systolic (congestive) heart failure: Secondary | ICD-10-CM

## 2016-05-04 DIAGNOSIS — R7989 Other specified abnormal findings of blood chemistry: Secondary | ICD-10-CM

## 2016-05-04 HISTORY — DX: Unspecified convulsions: R56.9

## 2016-05-04 HISTORY — DX: Hyperlipidemia, unspecified: E78.5

## 2016-05-04 HISTORY — DX: Other specified abnormal findings of blood chemistry: R79.89

## 2016-05-04 HISTORY — DX: Retention of urine, unspecified: R33.9

## 2016-05-04 HISTORY — DX: Unspecified adrenocortical insufficiency: E27.40

## 2016-05-04 HISTORY — DX: Dysphagia, unspecified: R13.10

## 2016-05-04 NOTE — Assessment & Plan Note (Signed)
Dysphagia - Dysphagia 1 diet per SLP

## 2016-05-04 NOTE — Assessment & Plan Note (Signed)
Low a.m. cortisol  - Was 1.2 checked on 04/03/2016  - This likely is reflective of cortisol suppression from steroid use - Tapered steroids off

## 2016-05-04 NOTE — Assessment & Plan Note (Signed)
Severe protein calorie malnutrition - On protein supplementation. - update CMP, CBC, TSH

## 2016-05-04 NOTE — Assessment & Plan Note (Signed)
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 Compensated. Currently on beta blocker, aspirin and statin.

## 2016-05-04 NOTE — Assessment & Plan Note (Signed)
Dyslipidemia - Continue statin.

## 2016-05-04 NOTE — Assessment & Plan Note (Signed)
Hypernatremia and ARF - Due to dehydration.  - Resolved with IV fluids.

## 2016-05-04 NOTE — Assessment & Plan Note (Signed)
Urinary retention - Continue Flomax, Bp low measurement 90/60, will continue to monitor for s/s of hypotension. May consider decrease Coreg if symptomatic hypotension presents.

## 2016-05-04 NOTE — Progress Notes (Signed)
Location:  [facility] Room Number: 111 A Place of Service:  SNF (31)SNF [facility]  Provider: Chipper Oman NP  Kimber Relic, MD  Patient Care Team: Kimber Relic, MD as PCP - General (Internal Medicine)  Extended Emergency Contact Information Primary Emergency Contact: Daphane Shepherd [place] of [place] Phone: [phone] Relation: Daughter Secondary Emergency Contact: Silberman,Shirley Address: [address]          [place], [place] [address] [place] of [place] Home Phone: [phone] Mobile Phone: [phone] Relation: Spouse  Code Status:  DNR Goals of care: Advanced Directive information Advanced Directives 05/05/2016  Does patient have an advance directive? No  Type of Advance Directive -  Does patient want to make changes to advanced directive? -  Copy of advanced directive(s) in chart? -  Would patient like information on creating an advanced directive? -     No chief complaint on file.   HPI:  Pt is a 50 y.o. male seen today for a hospital f/u hospitalization 04/25/16 to 04/30/16 for sepsis due to recurrent meningitis. Continue Rocephin, f/u ID prior to dc.     hx homelessness, admitted 3-14 to 03-10-16 with streptococcal (CSF Pneumococcus R-PEN) meningitis. His course was complicated by v-fib and respiratory arrest, as well as ARF due to ATN. He received 14 days of ceftriaxone, and weekly IM Pen G (for positive RPR 1:2). He was d/c to SNF. Non-verbal. At SNF his mental status was stable.   He returned to the ED on 4-16 after being found down. He was hypothermic. He was admitted to critical care on 4/16 with Severe Sepsis and transferred to hospitalist service on 4/19.  He was started on vanco/ceftriaxone. He had repeat LP showing 400 WBC (75%N), Prot > 600, Glc 37. He had solumedrol added. His CT head suggested subacute infarcts of basal ganglia. MRI also showed these and suggested them as residual from his  previous meningitis. (per Dr. Ninetta Lights) TEE no endocarditis, palliative team for goal of care  Past Medical History  Diagnosis Date  . Depression   . Cardiac arrest (HCC)   . Cardiogenic shock (HCC)   . Acute respiratory failure with hypoxia (HCC)   . Meningitis, streptococcal 02/16/2016  . 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 Tobacco abuse 02/16/2016  . Substance abuse   . ETOH abuse   . Septic shock (HCC)   . Urinary retention 05/04/2016    Urinary retention - Continue Flomax     . Systolic and diastolic CHF, chronic (HCC)   . 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 Severe sepsis (HCC)   . Respiratory failure (HCC) 03/03/2016  . Protein-calorie malnutrition, severe (HCC)   . NSTEMI (non-ST elevated myocardial infarction) (HCC) 03/14/2016  . Meningoencephalitis   . Meningitis, pneumococcal, recurrent   . Low serum cortisol level (HCC) 05/04/2016    Low a.m. cortisol  - Was 1.2 checked on 04/03/2016  - This likely is reflective of cortisol suppression from steroid use - Tapered steroids off   . Hypothermia 03/20/2016  . Hypernatremia   . Hyperlipidemia 05/04/2016    Dyslipidemia - Continue statin.     Marland Kitchen Hydrocephalus   . Dysphagia 05/04/2016    Dysphagia - Dysphagia 1 diet per SLP     . Dementia 04/27/2016  . Convulsions/seizures (HCC) 05/04/2016    Seizure disorder - Continue Keppra, Depakote.     . CHF (congestive heart failure) (HCC)   . Cerebral thrombosis with cerebral infarction 03/25/2016  .  Cerebral septic emboli (HCC)   . Altered mental status   . AKI (acute kidney injury) (HCC) 02/16/2016  . Acute renal failure (HCC)   . Acute ischemic stroke (HCC)   . Acute encephalopathy    Past Surgical History  Procedure Laterality Date  . Ankle surgery    . Cardiac catheterization N/A 02/17/2016    Procedure: IABP Insertion;  Surgeon: Iran OuchMuhammad A Arida, MD;  Location: [facility] INVASIVE CV LAB;  Service: Cardiovascular;  Laterality: N/A;  . Tee without cardioversion N/A 03/29/2016     Procedure: TRANSESOPHAGEAL ECHOCARDIOGRAM (TEE);  Surgeon: Laurey Moralealton S McLean, MD;  [facility];  Service: Cardiovascular;  Laterality: N/A;    No Known Allergies    Medication List       This list is accurate as of: 05/04/16 11:59 PM.  Always use your most recent med list.               albuterol (2.5 MG/3ML) 0.083% nebulizer solution  Commonly known as:  PROVENTIL  Take 3 mLs (2.5 mg total) by nebulization every 3 (three) hours as needed for wheezing.     aspirin 325 MG tablet  Place 1 tablet (325 mg total) into feeding tube daily.     atorvastatin 20 MG tablet  Commonly known as:  LIPITOR  Take 1 tablet (20 mg total) by mouth daily at 6 PM.     carvedilol 3.125 MG tablet  Commonly known as:  COREG  Place 1 tablet (3.125 mg total) into feeding tube 2 (two) times daily with a meal.     cefTRIAXone 2 g in dextrose 5 % 50 mL  Inject 2 g into the vein every 12 (twelve) hours.     famotidine 40 MG/5ML suspension  Commonly known as:  PEPCID  Place 2.5 mLs (20 mg total) into feeding tube daily.     feeding supplement (ENSURE ENLIVE) Liqd  Take 237 mLs by mouth every 6 (six) hours.     folic acid 1 MG tablet  Commonly known as:  FOLVITE  Place 1 tablet (1 mg total) into feeding tube daily.     levETIRAcetam 100 MG/ML solution  Commonly known as:  KEPPRA  Place 5 mLs (500 mg total) into feeding tube 2 (two) times daily.     tamsulosin 0.4 MG Caps capsule  Commonly known as:  FLOMAX  Take 1 capsule (0.4 mg total) by mouth daily.     thiamine 100 MG tablet  Place 1 tablet (100 mg total) into feeding tube daily.     valproic acid 250 MG/5ML syrup  Commonly known as:  DEPAKENE  Place 5 mLs (250 mg total) into feeding tube every 8 (eight) hours.        Review of Systems  Unable to perform ROS: other    Immunization History  Administered Date(s) Administered  . Influenza Split 09/09/2015  . PPD Test 03/11/2016, 04/29/2016  . Pneumococcal-Unspecified  03/11/2016   Pertinent  Health Maintenance Due  Topic Date Due  . COLONOSCOPY  08/13/2014  . INFLUENZA VACCINE  07/05/2016   No flowsheet data found. Functional Status Survey:    Filed Vitals:   05/04/16 1224  BP: 116/68  Pulse: 70  Temp: 97.9 F (36.6 C)  TempSrc: Oral  Resp: 18  Height: 5\' 8"  (1.727 m)  Weight: 123 lb (55.792 kg)   Body mass index is 18.71 kg/(m^2). Physical Exam  Constitutional:  Lying in bed, frail appearing in NAD; temporal wasting  HENT:  Mouth/Throat:  Oropharynx is clear and moist.  Eyes: Pupils are equal, round, and reactive to light. No scleral icterus.  Neck: Neck supple. Carotid bruit is not present. No tracheal deviation present. No thyromegaly present.  Janina Mayo present with no purulent d/c. no nuchal rigidity  Cardiovascular: Normal rate, regular rhythm and intact distal pulses.  Exam reveals no gallop and no friction rub.   Murmur (1/6 SEM) heard. no distal LE swelling. No calf TTP  Pulmonary/Chest: Effort normal and breath sounds normal. He has no wheezes. He has no rales. He exhibits no tenderness.  Abdominal: Soft. Bowel sounds are normal. He exhibits no distension, no abdominal bruit, no pulsatile midline mass and no mass. There is no tenderness. There is no rebound and no guarding.  Lymphadenopathy:    He has no cervical adenopathy.  Neurological: He is alert.  Skin: Skin is warm and dry. No rash noted.  Psychiatric: He has a normal mood and affect. His behavior is normal.    Labs reviewed:  Recent Labs  03/21/16 0322  03/25/16 0428  03/29/16 0425  04/01/16 0700  04/03/16 0905 04/17/16 1055 04/27/16 2349  NA 150*  < > 146*  < > 142  < >  --   < > 144 144 139  K 4.3  < > 4.0  < > 4.6  < >  --   < > 3.9 4.3 3.5  CL 120*  < > 114*  < > 101  < >  --   < > 103 105 100*  CO2 21*  < > 19*  < > 31  < >  --   < > 30 29 31   GLUCOSE 59*  < > 84  < > 106*  < >  --   < > 96 90 88  BUN 24*  < > 27*  < > 24*  < >  --   < > 17 16 19     CREATININE 1.39*  < > 1.51*  < > 1.21  < >  --   < > 0.87 1.12 1.23  CALCIUM 8.0*  < > 8.6*  < > 8.1*  < >  --   < > 8.7* 9.0 8.9  MG 1.7  < >  --   --  2.1  --  2.0  --  1.8  --   --   PHOS 4.1  --  4.0  --   --   --  4.4  --   --   --   --   < > = values in this interval not displayed.  Recent Labs  03/24/16 0458 03/26/16 0315 03/28/16 0400  AST 15 19 22   ALT 30 30 29   ALKPHOS 61 58 65  BILITOT 0.2* 0.3 0.3  PROT 5.7* 5.3* 5.2*  ALBUMIN 2.1* 1.9* 2.1*    Recent Labs  03/24/16 0458  04/03/16 0905 04/17/16 1055 04/27/16 2349  WBC 14.8*  < > 7.1 4.2 3.6*  NEUTROABS 13.1*  --   --  1.2* 0.6*  HGB 10.2*  < > 10.0* 9.4* 10.0*  HCT 31.7*  < > 29.5* 29.3* 31.3*  MCV 89.5  < > 88.9 90.4 89.9  PLT 263  < > 199 256 245  < > = values in this interval not displayed. Lab Results  Component Value Date   TSH 2.712 03/20/2016   Lab Results  Component Value Date   HGBA1C 5.8* 03/27/2016   Lab Results  Component Value Date  CHOL 161 03/27/2016   HDL 53 03/27/2016   LDLCALC 92 03/27/2016   TRIG 80 03/27/2016   CHOLHDL 3.0 03/27/2016    Significant Diagnostic Results in last 30 days:  Ir US Guide Vasc Access Right  04/26/2016  INDICATION: Meningitis. Poor venous access. Previously placed PICC line inadvertently removed. Request new PICC line placement. EXAM: RIGHT UPPER EXTREMITY PICC LINE PLACEMENT WITH ULTRASOUND AND FLUOROSCOPIC GUIDANCE MEDICATIONS: 1% Lidocaine ANESTHESIA/SEDATION: No sedation medication given. FLUOROSCOPY TIME:  Fluoroscopy Time: 0 minutes 12 seconds. COMPLICATIONS: None immediate. PROCEDURE: The patient was advised of the possible risks and complications and agreed to undergo the procedure. The patient was then brought to the angiographic suite for the procedure. The right arm was prepped with chlorhexidine, draped in the usual sterile fashion using maximum barrier technique (cap and mask, sterile gown, sterile gloves, large sterile sheet, hand hygiene and  cutaneous antisepsis) and infiltrated locally with 1% Lidocaine. Ultrasound demonstrated patency of the right basilic vein, and this was documented with an image. Under real-time ultrasound guidance, this vein was accessed with a 21 gauge micropuncture needle and image documentation was performed. A 0.018 wire was introduced in to the vein. Over this, a 5 french dual lumen power injectable PICC was advanced to the lower SVC/right atrial junction. Fluoroscopy during the procedure and fluoro spot radiograph confirms appropriate catheter position. The catheter was sutured in place and flushed and covered with a sterile dressing. Catheter length:  33 cm IMPRESSION: Successful right arm Power PICC line placement with ultrasound and fluoroscopic guidance. The catheter is ready for use. Read by:  Corrin Parker, PA-C Electronically Signed   By: Richarda Overlie M.D.   On: 04/26/2016 14:11   Ir Fluoro Guide Cv Midline Picc Right  04/26/2016  INDICATION: Meningitis. Poor venous access. Previously placed PICC line inadvertently removed. Request new PICC line placement. EXAM: RIGHT UPPER EXTREMITY PICC LINE PLACEMENT WITH ULTRASOUND AND FLUOROSCOPIC GUIDANCE MEDICATIONS: 1% Lidocaine ANESTHESIA/SEDATION: No sedation medication given. FLUOROSCOPY TIME:  Fluoroscopy Time: 0 minutes 12 seconds. COMPLICATIONS: None immediate. PROCEDURE: The patient was advised of the possible risks and complications and agreed to undergo the procedure. The patient was then brought to the angiographic suite for the procedure. The right arm was prepped with chlorhexidine, draped in the usual sterile fashion using maximum barrier technique (cap and mask, sterile gown, sterile gloves, large sterile sheet, hand hygiene and cutaneous antisepsis) and infiltrated locally with 1% Lidocaine. Ultrasound demonstrated patency of the right basilic vein, and this was documented with an image. Under real-time ultrasound guidance, this vein was accessed with a 21 gauge  micropuncture needle and image documentation was performed. A 0.018 wire was introduced in to the vein. Over this, a 5 french dual lumen power injectable PICC was advanced to the lower SVC/right atrial junction. Fluoroscopy during the procedure and fluoro spot radiograph confirms appropriate catheter position. The catheter was sutured in place and flushed and covered with a sterile dressing. Catheter length:  33 cm IMPRESSION: Successful right arm Power PICC line placement with ultrasound and fluoroscopic guidance. The catheter is ready for use. Read by:  Corrin Parker, PA-C Electronically Signed   By: Richarda Overlie M.D.   On: 04/26/2016 14:11    Assessment/Plan  Cerebral thrombosis with cerebral infarction Acute encephalopathy with right-sided hemi-neglect with L Corona Radiata infarct & Basal ganglia abnormalities noted on MRI - No significant changes in mental status - Patient seen by neurology in consultation - Their recommendation is to continue to treat for meningitis -  TRH discussed the case with neurologist Dr. Roda Shutters on 04/01/2016, per neurology rapidly taper off steroids - continue ASA , lipitor     Convulsions/seizures (HCC) Seizure disorder - Continue Keppra, Depakote  tid   Urinary retention Urinary retention - Continue Flomax, Bp low measurement 90/60, will continue to monitor for s/s of hypotension. May consider decrease Coreg if symptomatic hypotension presents.     Sepsis (HCC) Sepsis due to recurrent meningitis - Patient with streptococcal pneumonia isolated from blood and CSF cultures 02/16/2016 on previous hospitalization. - Patient initially on vancomycin and Rocephin. Vancomycin stopped per infectious disease recommendations and we continued Rocephin. - Patient will continue Rocephin for another 7 days after discharge, must see ID prior to stopping Rocephin. - PICC line in place  - Sepsis etiology resolved    Hyperlipidemia Dyslipidemia - Continue  statin.    Dysphagia Dysphagia - Dysphagia 1 diet per SLP    Protein-calorie malnutrition, severe (HCC) Severe protein calorie malnutrition - On protein supplementation. - update CMP, CBC, TSH  Hypernatremia Hypernatremia and ARF - Due to dehydration.  - Resolved with IV fluids.   CHF (congestive heart failure) (HCC) 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 Compensated. Currently on beta blocker, aspirin and statin.   Low serum cortisol level (HCC) Low a.m. cortisol  - Was 1.2 checked on 04/03/2016  - This likely is reflective of cortisol suppression from steroid use - Tapered steroids off   Acute encephalopathy Multiple factorials, continue Depakote to stabilize mood, prn Lorazepam available to him.     Family/ staff Communication: SNF, palliative care  Labs/tests ordered: CBC, CMP, TSH

## 2016-05-04 NOTE — Assessment & Plan Note (Addendum)
Multiple factorials, continue Depakote to stabilize mood, prn Lorazepam available to him.

## 2016-05-04 NOTE — Assessment & Plan Note (Signed)
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 - Patient initially on vancomycin and Rocephin. Vancomycin stopped per infectious disease recommendations and we continued Rocephin. - Patient will continue Rocephin for another 7 days after discharge, must see ID prior to stopping Rocephin. - PICC line in place  - Sepsis etiology resolved

## 2016-05-04 NOTE — Assessment & Plan Note (Addendum)
Seizure disorder - Continue Keppra, Depakote 250mg  tid

## 2016-05-04 NOTE — Assessment & Plan Note (Signed)
Acute encephalopathy with right-sided hemi-neglect with L Corona Radiata infarct & Basal ganglia abnormalities noted on MRI - No significant changes in mental status - Patient seen by neurology in consultation - Their recommendation is to continue to treat for meningitis - TRH discussed the case with neurologist Dr. Roda ShuttersXU on 04/01/2016, per neurology rapidly taper off steroids - continue ASA 325mg , lipitor 20mg 

## 2016-05-05 ENCOUNTER — Non-Acute Institutional Stay (SKILLED_NURSING_FACILITY): Payer: Medicaid Other | Admitting: Internal Medicine

## 2016-05-05 ENCOUNTER — Encounter: Payer: Self-pay | Admitting: Internal Medicine

## 2016-05-05 DIAGNOSIS — I6330 Cerebral infarction due to thrombosis of unspecified cerebral artery: Secondary | ICD-10-CM

## 2016-05-05 DIAGNOSIS — I5042 Chronic combined systolic (congestive) and diastolic (congestive) heart failure: Secondary | ICD-10-CM

## 2016-05-05 DIAGNOSIS — I5020 Unspecified systolic (congestive) heart failure: Secondary | ICD-10-CM

## 2016-05-05 DIAGNOSIS — R569 Unspecified convulsions: Secondary | ICD-10-CM

## 2016-05-05 DIAGNOSIS — R1310 Dysphagia, unspecified: Secondary | ICD-10-CM

## 2016-05-05 DIAGNOSIS — F0390 Unspecified dementia without behavioral disturbance: Secondary | ICD-10-CM

## 2016-05-05 DIAGNOSIS — Z5900 Homelessness unspecified: Secondary | ICD-10-CM

## 2016-05-05 DIAGNOSIS — I76 Septic arterial embolism: Secondary | ICD-10-CM

## 2016-05-05 DIAGNOSIS — I669 Occlusion and stenosis of unspecified cerebral artery: Secondary | ICD-10-CM

## 2016-05-05 DIAGNOSIS — G001 Pneumococcal meningitis: Secondary | ICD-10-CM

## 2016-05-05 DIAGNOSIS — R531 Weakness: Secondary | ICD-10-CM | POA: Diagnosis not present

## 2016-05-05 NOTE — Progress Notes (Signed)
Patient ID: Dale [name], male   DOB: Jun 23, 1966, 50 y.o.   MRN: [id number]   Location:  [facility] Room Number: 401 Place of Service:  SNF (31)  PCP: Kimber Relic, MD Patient Care Team: Kimber Relic, MD as PCP - General (Internal Medicine)  Extended Emergency Contact Information Primary Emergency Contact: Daphane Shepherd [place] Phone: [phone] Relation: Daughter Secondary Emergency Contact: Fissel,Shirley Address: [address]          [place], [place] [address] [place] Home Phone: [phone] Mobile Phone: [phone] Relation: Spouse  Code Status: full Goals of Care: Advanced Directive information Advanced Directives 05/05/2016  Does patient have an advance directive? No  Type of Advance Directive -  Does patient want to make changes to advanced directive? -  Copy of advanced directive(s) in chart? -  Would patient like information on creating an advanced directive? -      Chief Complaint  Patient presents with  . New Admit To SNF    following hospitalization 04/25/16 to 04/30/16 for sepsis due to recurrent meningitis    HPI: Patient is a 49 y.o. male seen today for admission to [facility] facility on 04/29/2016. Patient is seriously ill with a confusional state that is related in part at least to a recurrent streptococcal/ pneumococcal meningitis. He has been hospitalized 3 times since March 2017 for this problem. He has been treated with vancomycin and Rocephin. He has had seizures which are treated with Keppra. He also has had a altered mental status state and possible chronic dementia. Brain scans have shown a cerebral infarct left corona radiata and basal ganglia. He has had some right sided hemi-paresis and neglect.  Patient has significant cardiac disease. He has systolic and diastolic congestive heart failure with a left ventricular ejection fraction of 45-50% with  hypokinesis and diastolic dysfunction. In March 2017 he had septic shock and cardiogenic shock and cardiac arrest. He required tracheostomy at that time for a respiratory arrest.  Since coming to [facility], he has been very confused and has had a tendency to wander.  Patient has had palliative care consultation on 03/31/2016. Although this patient was homeless, his wife was found. She reported that they have been separated 21 years but remained legally married. His wife has liver cancer. She requests that he remain a full code. She requested aggressive medical care on his behalf since he is incapable of making informed decisions due to his confused state.  Current goals of care include strengthening, safety mobility, improvement in self-care skills , and discharge planning regarding his homeless state. He is very confused and possibly demented. It is hoped that, with time, that his mental status will improve. I'm skeptical that he will be able to safely care for himself for many weeks and perhaps never.  Past Medical History  Diagnosis Date  . Depression   . Cardiac arrest (HCC)   . Cardiogenic shock (HCC)   . Acute respiratory failure with hypoxia (HCC)   . Meningitis, streptococcal 02/16/2016  . 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 Tobacco abuse 02/16/2016  . Substance abuse   . ETOH abuse   . Septic shock (HCC)   . Urinary retention 05/04/2016    Urinary retention - Continue Flomax     . Systolic and diastolic CHF, chronic (HCC)   . 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 Severe sepsis (HCC)   . Respiratory failure (HCC) 03/03/2016  .  Protein-calorie malnutrition, severe (HCC)   . NSTEMI (non-ST elevated myocardial infarction) (HCC) 03/14/2016  . Meningoencephalitis   . Meningitis, pneumococcal, recurrent   . Low serum cortisol level (HCC) 05/04/2016    Low a.m. cortisol  - Was 1.2 checked on 04/03/2016  - This likely is reflective of cortisol suppression from steroid use - Tapered  steroids off   . Hypothermia 03/20/2016  . Hypernatremia   . Hyperlipidemia 05/04/2016    Dyslipidemia - Continue statin.     Marland Kitchen Hydrocephalus   . Dysphagia 05/04/2016    Dysphagia - Dysphagia 1 diet per SLP     . Dementia 04/27/2016  . Convulsions/seizures (HCC) 05/04/2016    Seizure disorder - Continue Keppra, Depakote.     . CHF (congestive heart failure) (HCC)   . Cerebral thrombosis with cerebral infarction 03/25/2016  . Cerebral septic emboli (HCC)   . Altered mental status   . AKI (acute kidney injury) (HCC) 02/16/2016  . Acute renal failure (HCC)   . Acute ischemic stroke (HCC)   . Acute encephalopathy    Past Surgical History  Procedure Laterality Date  . Ankle surgery    . Cardiac catheterization N/A 02/17/2016    Procedure: IABP Insertion;  Surgeon: Iran Ouch, MD;  Location: [facility] INVASIVE CV LAB;  Service: Cardiovascular;  Laterality: N/A;  . Tee without cardioversion N/A 03/29/2016    Procedure: TRANSESOPHAGEAL ECHOCARDIOGRAM (TEE);  Surgeon: Laurey Morale, MD;  Location: [facility] ENDOSCOPY;  Service: Cardiovascular;  Laterality: N/A;    reports that he has been smoking Cigarettes.  He has been smoking about 0.10 packs per day. He does not have any smokeless tobacco history on file. He reports that he drinks alcohol. He reports that he uses illicit drugs (Marijuana). Social History   Social History  . Marital Status: Legally Separated    Spouse Name: N/A  . Number of Children: N/A  . Years of Education: N/A   Occupational History  . Not on file.   Social History Main Topics  . Smoking status: Current Every Day Smoker -- 0.10 packs/day    Types: Cigarettes  . Smokeless tobacco: Not on file  . Alcohol Use: Yes  . Drug Use: Yes    Special: Marijuana  . Sexual Activity: Not on file   Other Topics Concern  . Not on file   Social History Narrative   Admitted to [facility] 05/04/16   FULL CODE    Functional Status Survey:    History reviewed. No pertinent  family history.  Health Maintenance  Topic Date Due  . COLONOSCOPY  08/13/2014  . TETANUS/TDAP  07/04/2016 (Originally 08/14/1983)  . INFLUENZA VACCINE  07/05/2016  . Hepatitis C Screening  Completed  . HIV Screening  Completed    No Known Allergies    Medication List       This list is accurate as of: 05/05/16 10:13 AM.  Always use your most recent med list.               albuterol (2.5 MG/3ML) 0.083% nebulizer solution  Commonly known as:  PROVENTIL  Take 3 mLs (2.5 mg total) by nebulization every 3 (three) hours as needed for wheezing.     aspirin 325 MG tablet  Place 1 tablet (325 mg total) into feeding tube daily.     atorvastatin 20 MG tablet  Commonly known as:  LIPITOR  Take 1 tablet (20 mg total) by mouth daily at 6 PM.     carvedilol 3.125  MG tablet  Commonly known as:  COREG  Place 1 tablet (3.125 mg total) into feeding tube 2 (two) times daily with a meal.     cefTRIAXone 2 g in dextrose 5 % 50 mL  Inject 2 g into the vein every 12 (twelve) hours.     famotidine 40 MG/5ML suspension  Commonly known as:  PEPCID  Place 2.5 mLs (20 mg total) into feeding tube daily.     feeding supplement (ENSURE ENLIVE) Liqd  Take 237 mLs by mouth every 6 (six) hours.     folic acid 1 MG tablet  Commonly known as:  FOLVITE  Place 1 tablet (1 mg total) into feeding tube daily.     levETIRAcetam 100 MG/ML solution  Commonly known as:  KEPPRA  Place 5 mLs (500 mg total) into feeding tube 2 (two) times daily.     tamsulosin 0.4 MG Caps capsule  Commonly known as:  FLOMAX  Take 1 capsule (0.4 mg total) by mouth daily.     thiamine 100 MG tablet  Place 1 tablet (100 mg total) into feeding tube daily.     valproic acid 250 MG/5ML syrup  Commonly known as:  DEPAKENE  Place 5 mLs (250 mg total) into feeding tube every 8 (eight) hours.        Review of Systems  Unable to perform ROS: other  Constitutional: Positive for fatigue.       Thin, frail  HENT: Negative.     Eyes: Negative.   Respiratory: Negative.   Cardiovascular: Positive for leg swelling. Negative for chest pain and palpitations.       History cardiac arrest, ventricular fibrillation, and chronic systolic and diastolic congestive heart failure with left ventricular ejection fraction 45-50%. History of hypokinesis and diastolic dysfunction.  Gastrointestinal: Negative.   Genitourinary:       Incontinent. BPH.  Musculoskeletal: Positive for gait problem.       Generalized weakness  Skin:       Chronic lesion of the lower anterior neck  Neurological:       Recurrent pneumococcal meningitis with associated encephalopathy and possible dementia. History of cerebral infarcts and possible septic cerebral emboli.  Hematological:       Anemic  Psychiatric/Behavioral: Positive for behavioral problems, confusion, sleep disturbance and decreased concentration. The patient is nervous/anxious.     Filed Vitals:   05/05/16 1001  BP: 112/62  Pulse: 68  Temp: 97.9 F (36.6 C)  Resp: 18  Height: 5\' 8"  (1.727 m)  Weight: 123 lb (55.792 kg)   Body mass index is 18.71 kg/(m^2). Physical Exam  Constitutional:  Lying in bed, frail appearing in NAD; temporal wasting  HENT:  Mouth/Throat: Oropharynx is clear and moist.  Eyes: Pupils are equal, round, and reactive to light. No scleral icterus.  Neck: Neck supple. Carotid bruit is not present. No tracheal deviation present. No thyromegaly present.  Janina Mayorach present with no purulent d/c. no nuchal rigidity  Cardiovascular: Normal rate, regular rhythm and intact distal pulses.  Exam reveals no gallop and no friction rub.   Murmur (1/6 SEM) heard. no distal LE swelling. No calf TTP  Pulmonary/Chest: Effort normal and breath sounds normal. He has no wheezes. He has no rales. He exhibits no tenderness.  Abdominal: Soft. Bowel sounds are normal. He exhibits no distension, no abdominal bruit, no pulsatile midline mass and no mass. There is no tenderness. There  is no rebound and no guarding.  Musculoskeletal:  Generalized weakness and wasting.  Lymphadenopathy:    He has no cervical adenopathy.  Neurological: He is alert.  Extremely confused. Verbal responses are sparse. Mild weakness of the right side.  Skin: Skin is warm and dry. No rash noted.  Small chronic sore lower anterior neck mildly tender to palpation.  Psychiatric:  Confused. History of wandering.    Labs reviewed: Basic Metabolic Panel:  Recent Labs  16/10/96 0322  03/25/16 0428  03/29/16 0425  04/01/16 0700  04/03/16 0905 04/17/16 1055 04/27/16 2349  NA 150*  < > 146*  < > 142  < >  --   < > 144 144 139  K 4.3  < > 4.0  < > 4.6  < >  --   < > 3.9 4.3 3.5  CL 120*  < > 114*  < > 101  < >  --   < > 103 105 100*  CO2 21*  < > 19*  < > 31  < >  --   < > 30 29 31   GLUCOSE 59*  < > 84  < > 106*  < >  --   < > 96 90 88  BUN 24*  < > 27*  < > 24*  < >  --   < > 17 16 19   CREATININE 1.39*  < > 1.51*  < > 1.21  < >  --   < > 0.87 1.12 1.23  CALCIUM 8.0*  < > 8.6*  < > 8.1*  < >  --   < > 8.7* 9.0 8.9  MG 1.7  < >  --   --  2.1  --  2.0  --  1.8  --   --   PHOS 4.1  --  4.0  --   --   --  4.4  --   --   --   --   < > = values in this interval not displayed. Liver Function Tests:  Recent Labs  03/24/16 0458 03/26/16 0315 03/28/16 0400  AST 15 19 22   ALT 30 30 29   ALKPHOS 61 58 65  BILITOT 0.2* 0.3 0.3  PROT 5.7* 5.3* 5.2*  ALBUMIN 2.1* 1.9* 2.1*   No results for input(s): LIPASE, AMYLASE in the last 8760 hours.  Recent Labs  03/24/16 0458 03/27/16 0536  AMMONIA 43* 24   CBC:  Recent Labs  03/24/16 0458  04/03/16 0905 04/17/16 1055 04/27/16 2349  WBC 14.8*  < > 7.1 4.2 3.6*  NEUTROABS 13.1*  --   --  1.2* 0.6*  HGB 10.2*  < > 10.0* 9.4* 10.0*  HCT 31.7*  < > 29.5* 29.3* 31.3*  MCV 89.5  < > 88.9 90.4 89.9  PLT 263  < > 199 256 245  < > = values in this interval not displayed. Cardiac Enzymes:  Recent Labs  02/17/16 0033  02/17/16 0845  03/21/16 0322 03/21/16 1240  CKTOTAL 645*  --  540*  --   --   TROPONINI 13.02*  < > 4.74* <0.03 <0.03  < > = values in this interval not displayed. BNP: Invalid input(s): POCBNP Lab Results  Component Value Date   HGBA1C 5.8* 03/27/2016   Lab Results  Component Value Date   TSH 2.712 03/20/2016   Lab Results  Component Value Date   VITAMINB12 1101* 03/24/2016   Lab Results  Component Value Date   FOLATE 9.2 03/24/2016   Lab Results  Component Value Date   IRON 92 03/24/2016  TIBC 223* 03/24/2016   FERRITIN 412* 03/24/2016    Imaging and Procedures obtained prior to SNF admission: Ir US Guide Vasc Access Right  04/26/2016  INDICATION: Meningitis. Poor venous access. Previously placed PICC line inadvertently removed. Request new PICC line placement. EXAM: RIGHT UPPER EXTREMITY PICC LINE PLACEMENT WITH ULTRASOUND AND FLUOROSCOPIC GUIDANCE MEDICATIONS: 1% Lidocaine ANESTHESIA/SEDATION: No sedation medication given. FLUOROSCOPY TIME:  Fluoroscopy Time: 0 minutes 12 seconds. COMPLICATIONS: None immediate. PROCEDURE: The patient was advised of the possible risks and complications and agreed to undergo the procedure. The patient was then brought to the angiographic suite for the procedure. The right arm was prepped with chlorhexidine, draped in the usual sterile fashion using maximum barrier technique (cap and mask, sterile gown, sterile gloves, large sterile sheet, hand hygiene and cutaneous antisepsis) and infiltrated locally with 1% Lidocaine. Ultrasound demonstrated patency of the right basilic vein, and this was documented with an image. Under real-time ultrasound guidance, this vein was accessed with a 21 gauge micropuncture needle and image documentation was performed. A 0.018 wire was introduced in to the vein. Over this, a 5 french dual lumen power injectable PICC was advanced to the lower SVC/right atrial junction. Fluoroscopy during the procedure and fluoro spot radiograph  confirms appropriate catheter position. The catheter was sutured in place and flushed and covered with a sterile dressing. Catheter length:  33 cm IMPRESSION: Successful right arm Power PICC line placement with ultrasound and fluoroscopic guidance. The catheter is ready for use. Read by:  Corrin Parker, PA-C Electronically Signed   By: Richarda Overlie M.D.   On: 04/26/2016 14:11   Ir Fluoro Guide Cv Midline Picc Right  04/26/2016  INDICATION: Meningitis. Poor venous access. Previously placed PICC line inadvertently removed. Request new PICC line placement. EXAM: RIGHT UPPER EXTREMITY PICC LINE PLACEMENT WITH ULTRASOUND AND FLUOROSCOPIC GUIDANCE MEDICATIONS: 1% Lidocaine ANESTHESIA/SEDATION: No sedation medication given. FLUOROSCOPY TIME:  Fluoroscopy Time: 0 minutes 12 seconds. COMPLICATIONS: None immediate. PROCEDURE: The patient was advised of the possible risks and complications and agreed to undergo the procedure. The patient was then brought to the angiographic suite for the procedure. The right arm was prepped with chlorhexidine, draped in the usual sterile fashion using maximum barrier technique (cap and mask, sterile gown, sterile gloves, large sterile sheet, hand hygiene and cutaneous antisepsis) and infiltrated locally with 1% Lidocaine. Ultrasound demonstrated patency of the right basilic vein, and this was documented with an image. Under real-time ultrasound guidance, this vein was accessed with a 21 gauge micropuncture needle and image documentation was performed. A 0.018 wire was introduced in to the vein. Over this, a 5 french dual lumen power injectable PICC was advanced to the lower SVC/right atrial junction. Fluoroscopy during the procedure and fluoro spot radiograph confirms appropriate catheter position. The catheter was sutured in place and flushed and covered with a sterile dressing. Catheter length:  33 cm IMPRESSION: Successful right arm Power PICC line placement with ultrasound and fluoroscopic  guidance. The catheter is ready for use. Read by:  Corrin Parker, PA-C Electronically Signed   By: Richarda Overlie M.D.   On: 04/26/2016 14:11    Assessment/Plan  1. Meningitis, pneumococcal, recurrent Due to the recurrent nature of this illness, it was felt prudent to have him on contact isolation. Remains on Rocephin intravenously. He needs follow-up appointment scheduled with infectious diseases, Dr. Ninetta Lights.  2. Dementia, without behavioral disturbance Initially after admission, the patient had some episodes of wandering into other patient's rooms. [facility] is providing a Comptroller  to make sure the patient remains confined to his room. Contributing factors to the dementia include encephalopathy related to the recurrent meningitis as well as evidence of cerebral infarcts.  3. Convulsions, unspecified convulsion type (HCC) Maintenance on Keppra  4. Cerebral septic emboli (HCC) Likely secondary to septic episode in March 2017   5. Cerebral thrombosis with cerebral infarction Evidence of brain scans of infarcts in the radiata and basal ganglia  6. Systolic congestive heart failure, unspecified congestive heart failure chronicity (HCC) Appears to be compensated on current medications  7. Dysphagia Tolerating current diet  8. Systolic and diastolic CHF, chronic (HCC)  9. Homeless Patient will need discharge planning for a place to stay following his acute admission  10. Weak Continue physical therapy and occupational therapy.  LAB: CBC, CMP, BNP, TSH

## 2016-05-06 DIAGNOSIS — R531 Weakness: Secondary | ICD-10-CM | POA: Insufficient documentation

## 2016-05-09 ENCOUNTER — Encounter: Payer: Self-pay | Admitting: Internal Medicine

## 2016-05-09 ENCOUNTER — Non-Acute Institutional Stay (SKILLED_NURSING_FACILITY): Payer: Medicaid Other | Admitting: Internal Medicine

## 2016-05-09 DIAGNOSIS — G001 Pneumococcal meningitis: Secondary | ICD-10-CM

## 2016-05-09 DIAGNOSIS — G9340 Encephalopathy, unspecified: Secondary | ICD-10-CM

## 2016-05-09 DIAGNOSIS — F0390 Unspecified dementia without behavioral disturbance: Secondary | ICD-10-CM

## 2016-05-09 NOTE — Progress Notes (Signed)
Patient ID: Dale [name], male   DOB: [birth year], 50 y.o.   MRN: [id number]  Location:  [facility] Room Number: 350 Place of Service:  SNF (31) Provider:  Estill Dooms, MD  Patient Care Team: Estill Dooms, MD as PCP - General (Internal Medicine)  Extended Emergency Contact Information Primary Emergency Contact: Geannie Risen [place] of [place] Phone: [phone] Relation: Daughter Secondary Emergency Contact: Kost,Shirley Address: [place], [place] [address] Johnnette Litter of [place] Phone: [phone] Mobile Phone: [phone] Relation: Spouse  Code Status:  full Goals of care: Advanced Directive information Advanced Directives 05/09/2016  Does patient have an advance directive? No  Type of Advance Directive -  Does patient want to make changes to advanced directive? -  Copy of advanced directive(s) in chart? -     Chief Complaint  Patient presents with  . Acute Visit    wandering into other patients room, rude behavior    HPI:  Pt is a 50 y.o. male seen today for an acute visit for Encephalopathy and dementia. Patient continues on contact precautions. [facility] has hired a Tax adviser to watch over this patient and be sure he remains in his room.  Patient is basically unchanged from my initial visit.     Past Medical History  Diagnosis Date  . Depression   . Cardiac arrest ([place])   . Cardiogenic shock ([place])   . Acute respiratory failure with hypoxia ([place])   . Meningitis, streptococcal 02/16/2016  . 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 Tobacco abuse 02/16/2016  . Substance abuse   . ETOH abuse   . Septic shock ([place])   . Urinary retention 05/04/2016    Urinary retention - Continue Flomax     . Systolic and diastolic CHF, chronic ([facility])   . 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 Severe sepsis ([place])   . Respiratory failure ([facility]) 03/03/2016  . Protein-calorie malnutrition, severe  ([place])   . NSTEMI (non-ST elevated myocardial infarction) ([place]) 03/14/2016  . Meningoencephalitis   . Meningitis, pneumococcal, recurrent   . Low serum cortisol level ([facility]) 05/04/2016    Low a.m. cortisol  - Was 1.2 checked on 04/03/2016  - This likely is reflective of cortisol suppression from steroid use - Tapered steroids off   . Hypothermia 03/20/2016  . Hypernatremia   . Hyperlipidemia 05/04/2016    Dyslipidemia - Continue statin.     Mar[place] Kitchen Hydrocephalus   . Dysphagia 05/04/2016    Dysphagia - Dysphagia 1 diet per SLP     . Dementia 04/27/2016  . Convulsions/seizures ([place]) 05/04/2016    Seizure disorder - Continue Keppra, Depakote.     . CHF (congestive heart failure) ([facility])   . Cerebral thrombosis with cerebral infarction 03/25/2016  . 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 Altered mental status   . AKI (acute kidney injury) ([facility]) 02/16/2016  . Acute renal failure ([place])   . Acute ischemic stroke ([place])   . Acute encephalopathy    Past Surgical History  Procedure Laterality Date  . Ankle surgery    . Cardiac catheterization N/A 02/17/2016    Procedure: IABP Insertion;  Surgeon: Wellington Hampshire, MD;  Location: [facility];  Service: Cardiovascular;  Laterality: N/A;  . Tee without cardioversion N/A 03/29/2016    Procedure: TRANSESOPHAGEAL ECHOCARDIOGRAM (TEE);  Surgeon: Larey Dresser, MD;  Location: [facility];  Service: Cardiovascular;  Laterality:  N/A;    No Known Allergies    Medication List       This list is accurate as of: 05/09/16 10:24 AM.  Always use your most recent med list.               albuterol (2.5 MG/3ML) 0.083% nebulizer solution  Commonly known as:  PROVENTIL  Take 3 mLs (2.5 mg total) by nebulization every 3 (three) hours as needed for wheezing.     aspirin 325 MG tablet  Place 1 tablet (325 mg total) into feeding tube daily.     atorvastatin 20 MG tablet  Commonly known as:  LIPITOR  Take 1 tablet (20 mg total) by mouth daily at 6 PM.      carvedilol 3.125 MG tablet  Commonly known as:  COREG  Place 1 tablet (3.125 mg total) into feeding tube 2 (two) times daily with a meal.     famotidine 40 MG/5ML suspension  Commonly known as:  PEPCID  Place 2.5 mLs (20 mg total) into feeding tube daily.     feeding supplement (ENSURE ENLIVE) Liqd  Take 237 mLs by mouth every 6 (six) hours.     folic acid 1 MG tablet  Commonly known as:  FOLVITE  Place 1 tablet (1 mg total) into feeding tube daily.     levETIRAcetam 100 MG/ML solution  Commonly known as:  KEPPRA  Place 5 mLs (500 mg total) into feeding tube 2 (two) times daily.     tamsulosin 0.4 MG Caps capsule  Commonly known as:  FLOMAX  Take 1 capsule (0.4 mg total) by mouth daily.     thiamine 100 MG tablet  Place 1 tablet (100 mg total) into feeding tube daily.     valproic acid 250 MG/5ML syrup  Commonly known as:  DEPAKENE  Place 5 mLs (250 mg total) into feeding tube every 8 (eight) hours.        Review of Systems  Constitutional: Positive for fatigue.       Thin, frail  HENT: Negative.   Eyes: Negative.   Respiratory: Negative.   Cardiovascular: Positive for leg swelling. Negative for chest pain and palpitations.       History cardiac arrest, ventricular fibrillation, and chronic systolic and diastolic congestive heart failure with left ventricular ejection fraction 45-50%. History of hypokinesis and diastolic dysfunction.  Gastrointestinal: Negative.   Genitourinary:       Incontinent. BPH.  Musculoskeletal: Positive for gait problem.       Generalized weakness  Skin:       Chronic lesion of the lower anterior neck  Neurological:       Recurrent pneumococcal meningitis with associated encephalopathy and possible dementia. History of cerebral infarcts and possible septic cerebral emboli.  Hematological:       Anemic  Psychiatric/Behavioral: Positive for behavioral problems, confusion, sleep disturbance and decreased concentration. The patient is  nervous/anxious.        History of wandering    Immunization History  Administered Date(s) Administered  . Influenza Split 09/09/2015  . PPD Test 03/11/2016, 04/29/2016  . Pneumococcal-Unspecified 03/11/2016   Pertinent  Health Maintenance Due  Topic Date Due  . COLONOSCOPY  08/13/2014  . INFLUENZA VACCINE  07/05/2016   No flowsheet data found. Functional Status Survey:    Filed Vitals:   05/09/16 1013  BP: 118/64  Pulse: 68  Temp: 97.6 F (36.4 C)  Resp: 18  Height: 5\' 8"  (1.727 m)  Weight: 123 lb (55.792  kg)   Body mass index is 18.71 kg/(m^2). Physical Exam  Constitutional:  Lying in bed, frail appearing in NAD; temporal wasting  HENT:  Mouth/Throat: Oropharynx is clear and moist.  Eyes: Pupils are equal, round, and reactive to light. No scleral icterus.  Neck: Neck supple. Carotid bruit is not present. No tracheal deviation present. No thyromegaly present.  Lurline Idol present with no purulent d/c. no nuchal rigidity  Cardiovascular: Normal rate, regular rhythm and intact distal pulses.  Exam reveals no gallop and no friction rub.   Murmur (1/6 SEM) heard. no distal LE swelling. No calf TTP  Pulmonary/Chest: Effort normal and breath sounds normal. He has no wheezes. He has no rales. He exhibits no tenderness.  Abdominal: Soft. Bowel sounds are normal. He exhibits no distension, no abdominal bruit, no pulsatile midline mass and no mass. There is no tenderness. There is no rebound and no guarding.  Musculoskeletal:  Generalized weakness and wasting.  Lymphadenopathy:    He has no cervical adenopathy.  Neurological: He is alert.  Extremely confused. Verbal responses are sparse. Mild weakness of the right side.  Skin: Skin is warm and dry. No rash noted.  Small chronic sore lower anterior neck mildly tender to palpation.  Psychiatric:  Confused. History of wandering.    Labs reviewed:  Recent Labs  03/21/16 0322  03/25/16 0428  03/29/16 0425  04/01/16 0700   04/03/16 0905 04/17/16 1055 04/27/16 2349  NA 150*  < > 146*  < > 142  < >  --   < > 144 144 139  K 4.3  < > 4.0  < > 4.6  < >  --   < > 3.9 4.3 3.5  CL 120*  < > 114*  < > 101  < >  --   < > 103 105 100*  CO2 21*  < > 19*  < > 31  < >  --   < > '30 29 31  '$ GLUCOSE 59*  < > 84  < > 106*  < >  --   < > 96 90 88  BUN 24*  < > 27*  < > 24*  < >  --   < > '17 16 19  '$ CREATININE 1.39*  < > 1.51*  < > 1.21  < >  --   < > 0.87 1.12 1.23  CALCIUM 8.0*  < > 8.6*  < > 8.1*  < >  --   < > 8.7* 9.0 8.9  MG 1.7  < >  --   --  2.1  --  2.0  --  1.8  --   --   PHOS 4.1  --  4.0  --   --   --  4.4  --   --   --   --   < > = values in this interval not displayed.  Recent Labs  03/24/16 0458 03/26/16 0315 03/28/16 0400  AST '15 19 22  '$ ALT '30 30 29  '$ ALKPHOS 61 58 65  BILITOT 0.2* 0.3 0.3  PROT 5.7* 5.3* 5.2*  ALBUMIN 2.1* 1.9* 2.1*    Recent Labs  03/24/16 0458  04/03/16 0905 04/17/16 1055 04/27/16 2349  WBC 14.8*  < > 7.1 4.2 3.6*  NEUTROABS 13.1*  --   --  1.2* 0.6*  HGB 10.2*  < > 10.0* 9.4* 10.0*  HCT 31.7*  < > 29.5* 29.3* 31.3*  MCV 89.5  < > 88.9 90.4 89.9  PLT  263  < > 199 256 245  < > = values in this interval not displayed. Lab Results  Component Value Date   TSH 2.712 03/20/2016   Lab Results  Component Value Date   HGBA1C 5.8* 03/27/2016   Lab Results  Component Value Date   CHOL 161 03/27/2016   HDL 53 03/27/2016   LDLCALC 92 03/27/2016   TRIG 80 03/27/2016   CHOLHDL 3.0 03/27/2016    Significant Diagnostic Results in last 30 days:  Ir US Guide Vasc Access Right  04/26/2016  INDICATION: Meningitis. Poor venous access. Previously placed PICC line inadvertently removed. Request new PICC line placement. EXAM: RIGHT UPPER EXTREMITY PICC LINE PLACEMENT WITH ULTRASOUND AND FLUOROSCOPIC GUIDANCE MEDICATIONS: 1% Lidocaine ANESTHESIA/SEDATION: No sedation medication given. FLUOROSCOPY TIME:  Fluoroscopy Time: 0 minutes 12 seconds. COMPLICATIONS: None immediate. PROCEDURE: The  patient was advised of the possible risks and complications and agreed to undergo the procedure. The patient was then brought to the angiographic suite for the procedure. The right arm was prepped with chlorhexidine, draped in the usual sterile fashion using maximum barrier technique (cap and mask, sterile gown, sterile gloves, large sterile sheet, hand hygiene and cutaneous antisepsis) and infiltrated locally with 1% Lidocaine. Ultrasound demonstrated patency of the right basilic vein, and this was documented with an image. Under real-time ultrasound guidance, this vein was accessed with a 21 gauge micropuncture needle and image documentation was performed. A 0.018 wire was introduced in to the vein. Over this, a 5 french dual lumen power injectable PICC was advanced to the lower SVC/right atrial junction. Fluoroscopy during the procedure and fluoro spot radiograph confirms appropriate catheter position. The catheter was sutured in place and flushed and covered with a sterile dressing. Catheter length:  33 cm IMPRESSION: Successful right arm Power PICC line placement with ultrasound and fluoroscopic guidance. The catheter is ready for use. Read by:  Gareth Eagle, PA-C Electronically Signed   By: Markus Daft M.D.   On: 04/26/2016 14:11   Ir US Guide Vasc Access Right  04/21/2016  INDICATION: Meningitis. Poor venous access. Previously placed PICC line inadvertently removed. Request new PICC line placement. EXAM: ULTRASOUND AND FLUOROSCOPIC GUIDED PICC LINE INSERTION MEDICATIONS: None. CONTRAST:  None FLUOROSCOPY TIME:  12 seconds COMPLICATIONS: None immediate. TECHNIQUE: The procedure, risks, benefits, and alternatives were explained to the patient's son and spouse and informed written consent was obtained. A timeout was performed prior to the initiation of the procedure. The right upper extremity was prepped with chlorhexidine in a sterile fashion, and a sterile drape was applied covering the operative field.  Maximum barrier sterile technique with sterile gowns and gloves were used for the procedure. A timeout was performed prior to the initiation of the procedure. Local anesthesia was provided with 1% lidocaine. Under direct ultrasound guidance, the right basilic vein was accessed with a micropuncture kit after the overlying soft tissues were anesthetized with 1% lidocaine. An ultrasound image was saved for documentation purposes. A guidewire was advanced to the level of the superior caval-atrial junction for measurement purposes and the PICC line was cut to length. A peel-away sheath was placed and a 35 cm, 5 [place], dual lumen was inserted to level of the superior caval-atrial junction. A post procedure spot fluoroscopic was obtained. The catheter easily aspirated and flushed and was sutured in place. A dressing was placed. The patient tolerated the procedure well without immediate post procedural complication. FINDINGS: After catheter placement, the tip lies within the superior cavoatrial junction. The catheter  aspirates and flushes normally and is ready for immediate use. IMPRESSION: Successful ultrasound and fluoroscopic guided placement of a right basilic vein approach, 35 cm, 5 French, dual lumen PICC with tip at the superior caval-atrial junction. The PICC line is ready for immediate use. Read by: Ascencion Dike PA-C Electronically Signed   By: Markus Daft M.D.   On: 04/21/2016 13:19   Ir Fluoro Guide Cv Midline Picc Right  04/26/2016  INDICATION: Meningitis. Poor venous access. Previously placed PICC line inadvertently removed. Request new PICC line placement. EXAM: RIGHT UPPER EXTREMITY PICC LINE PLACEMENT WITH ULTRASOUND AND FLUOROSCOPIC GUIDANCE MEDICATIONS: 1% Lidocaine ANESTHESIA/SEDATION: No sedation medication given. FLUOROSCOPY TIME:  Fluoroscopy Time: 0 minutes 12 seconds. COMPLICATIONS: None immediate. PROCEDURE: The patient was advised of the possible risks and complications and agreed to undergo  the procedure. The patient was then brought to the angiographic suite for the procedure. The right arm was prepped with chlorhexidine, draped in the usual sterile fashion using maximum barrier technique (cap and mask, sterile gown, sterile gloves, large sterile sheet, hand hygiene and cutaneous antisepsis) and infiltrated locally with 1% Lidocaine. Ultrasound demonstrated patency of the right basilic vein, and this was documented with an image. Under real-time ultrasound guidance, this vein was accessed with a 21 gauge micropuncture needle and image documentation was performed. A 0.018 wire was introduced in to the vein. Over this, a 5 french dual lumen power injectable PICC was advanced to the lower SVC/right atrial junction. Fluoroscopy during the procedure and fluoro spot radiograph confirms appropriate catheter position. The catheter was sutured in place and flushed and covered with a sterile dressing. Catheter length:  33 cm IMPRESSION: Successful right arm Power PICC line placement with ultrasound and fluoroscopic guidance. The catheter is ready for use. Read by:  Gareth Eagle, PA-C Electronically Signed   By: Markus Daft M.D.   On: 04/26/2016 14:11   Ir Fluoro Guide Cv Midline Picc Right  04/21/2016  INDICATION: Meningitis. Poor venous access. Previously placed PICC line inadvertently removed. Request new PICC line placement. EXAM: ULTRASOUND AND FLUOROSCOPIC GUIDED PICC LINE INSERTION MEDICATIONS: None. CONTRAST:  None FLUOROSCOPY TIME:  12 seconds COMPLICATIONS: None immediate. TECHNIQUE: The procedure, risks, benefits, and alternatives were explained to the patient's son and spouse and informed written consent was obtained. A timeout was performed prior to the initiation of the procedure. The right upper extremity was prepped with chlorhexidine in a sterile fashion, and a sterile drape was applied covering the operative field. Maximum barrier sterile technique with sterile gowns and gloves were used for  the procedure. A timeout was performed prior to the initiation of the procedure. Local anesthesia was provided with 1% lidocaine. Under direct ultrasound guidance, the right basilic vein was accessed with a micropuncture kit after the overlying soft tissues were anesthetized with 1% lidocaine. An ultrasound image was saved for documentation purposes. A guidewire was advanced to the level of the superior caval-atrial junction for measurement purposes and the PICC line was cut to length. A peel-away sheath was placed and a 35 cm, 5 [place], dual lumen was inserted to level of the superior caval-atrial junction. A post procedure spot fluoroscopic was obtained. The catheter easily aspirated and flushed and was sutured in place. A dressing was placed. The patient tolerated the procedure well without immediate post procedural complication. FINDINGS: After catheter placement, the tip lies within the superior cavoatrial junction. The catheter aspirates and flushes normally and is ready for immediate use. IMPRESSION: Successful ultrasound and fluoroscopic guided placement  of a right basilic vein approach, 35 cm, 5 [place], dual lumen PICC with tip at the superior caval-atrial junction. The PICC line is ready for immediate use. Read by: Ascencion Dike PA-C Electronically Signed   By: Markus Daft M.D.   On: 04/21/2016 13:19    Assessment/Plan 1. Meningitis, pneumococcal, recurrent Patient is finishing his antibiotics. He needs an appointment to see Dr. Johnnye Sima, I.D.  2. Dementia, without behavioral disturbance Unchanged  3. Acute encephalopathy Unchanged

## 2016-05-19 ENCOUNTER — Emergency Department (HOSPITAL_COMMUNITY): Payer: Medicaid Other

## 2016-05-19 ENCOUNTER — Encounter (HOSPITAL_COMMUNITY): Payer: Self-pay | Admitting: *Deleted

## 2016-05-19 ENCOUNTER — Inpatient Hospital Stay (HOSPITAL_COMMUNITY)
Admission: EM | Admit: 2016-05-19 | Discharge: 2016-06-01 | DRG: 871 | Disposition: A | Discharge status: Skilled Nursing Facility | Payer: Medicaid Other | Attending: Internal Medicine | Admitting: Internal Medicine

## 2016-05-19 DIAGNOSIS — R319 Hematuria, unspecified: Secondary | ICD-10-CM | POA: Diagnosis not present

## 2016-05-19 DIAGNOSIS — Z681 Body mass index (BMI) 19 or less, adult: Secondary | ICD-10-CM | POA: Diagnosis not present

## 2016-05-19 DIAGNOSIS — R509 Fever, unspecified: Secondary | ICD-10-CM

## 2016-05-19 DIAGNOSIS — F0150 Vascular dementia without behavioral disturbance: Secondary | ICD-10-CM | POA: Diagnosis present

## 2016-05-19 DIAGNOSIS — E878 Other disorders of electrolyte and fluid balance, not elsewhere classified: Secondary | ICD-10-CM | POA: Diagnosis present

## 2016-05-19 DIAGNOSIS — R001 Bradycardia, unspecified: Secondary | ICD-10-CM | POA: Diagnosis not present

## 2016-05-19 DIAGNOSIS — F17210 Nicotine dependence, cigarettes, uncomplicated: Secondary | ICD-10-CM | POA: Diagnosis present

## 2016-05-19 DIAGNOSIS — G001 Pneumococcal meningitis: Secondary | ICD-10-CM

## 2016-05-19 DIAGNOSIS — I252 Old myocardial infarction: Secondary | ICD-10-CM | POA: Diagnosis not present

## 2016-05-19 DIAGNOSIS — Z789 Other specified health status: Secondary | ICD-10-CM

## 2016-05-19 DIAGNOSIS — R2681 Unsteadiness on feet: Secondary | ICD-10-CM | POA: Diagnosis present

## 2016-05-19 DIAGNOSIS — E875 Hyperkalemia: Secondary | ICD-10-CM | POA: Diagnosis present

## 2016-05-19 DIAGNOSIS — E46 Unspecified protein-calorie malnutrition: Secondary | ICD-10-CM | POA: Diagnosis present

## 2016-05-19 DIAGNOSIS — J690 Pneumonitis due to inhalation of food and vomit: Secondary | ICD-10-CM | POA: Diagnosis present

## 2016-05-19 DIAGNOSIS — Z931 Gastrostomy status: Secondary | ICD-10-CM | POA: Diagnosis not present

## 2016-05-19 DIAGNOSIS — E860 Dehydration: Secondary | ICD-10-CM | POA: Diagnosis present

## 2016-05-19 DIAGNOSIS — Z4659 Encounter for fitting and adjustment of other gastrointestinal appliance and device: Secondary | ICD-10-CM

## 2016-05-19 DIAGNOSIS — R6521 Severe sepsis with septic shock: Secondary | ICD-10-CM | POA: Diagnosis present

## 2016-05-19 DIAGNOSIS — Z8674 Personal history of sudden cardiac arrest: Secondary | ICD-10-CM

## 2016-05-19 DIAGNOSIS — I5042 Chronic combined systolic (congestive) and diastolic (congestive) heart failure: Secondary | ICD-10-CM | POA: Diagnosis present

## 2016-05-19 DIAGNOSIS — H518 Other specified disorders of binocular movement: Secondary | ICD-10-CM | POA: Diagnosis present

## 2016-05-19 DIAGNOSIS — R4701 Aphasia: Secondary | ICD-10-CM | POA: Diagnosis present

## 2016-05-19 DIAGNOSIS — R339 Retention of urine, unspecified: Secondary | ICD-10-CM | POA: Diagnosis present

## 2016-05-19 DIAGNOSIS — E869 Volume depletion, unspecified: Secondary | ICD-10-CM | POA: Diagnosis present

## 2016-05-19 DIAGNOSIS — T68XXXA Hypothermia, initial encounter: Secondary | ICD-10-CM

## 2016-05-19 DIAGNOSIS — J9601 Acute respiratory failure with hypoxia: Secondary | ICD-10-CM | POA: Diagnosis present

## 2016-05-19 DIAGNOSIS — E785 Hyperlipidemia, unspecified: Secondary | ICD-10-CM | POA: Diagnosis present

## 2016-05-19 DIAGNOSIS — N179 Acute kidney failure, unspecified: Secondary | ICD-10-CM | POA: Diagnosis present

## 2016-05-19 DIAGNOSIS — G40409 Other generalized epilepsy and epileptic syndromes, not intractable, without status epilepticus: Secondary | ICD-10-CM | POA: Diagnosis present

## 2016-05-19 DIAGNOSIS — D696 Thrombocytopenia, unspecified: Secondary | ICD-10-CM | POA: Diagnosis present

## 2016-05-19 DIAGNOSIS — Z8661 Personal history of infections of the central nervous system: Secondary | ICD-10-CM

## 2016-05-19 DIAGNOSIS — R569 Unspecified convulsions: Secondary | ICD-10-CM

## 2016-05-19 DIAGNOSIS — I76 Septic arterial embolism: Secondary | ICD-10-CM | POA: Diagnosis not present

## 2016-05-19 DIAGNOSIS — E440 Moderate protein-calorie malnutrition: Secondary | ICD-10-CM | POA: Diagnosis present

## 2016-05-19 DIAGNOSIS — Z8673 Personal history of transient ischemic attack (TIA), and cerebral infarction without residual deficits: Secondary | ICD-10-CM

## 2016-05-19 DIAGNOSIS — R740 Nonspecific elevation of levels of transaminase and lactic acid dehydrogenase [LDH]: Secondary | ICD-10-CM | POA: Diagnosis not present

## 2016-05-19 DIAGNOSIS — R1310 Dysphagia, unspecified: Secondary | ICD-10-CM | POA: Diagnosis present

## 2016-05-19 DIAGNOSIS — A419 Sepsis, unspecified organism: Secondary | ICD-10-CM | POA: Diagnosis present

## 2016-05-19 DIAGNOSIS — I340 Nonrheumatic mitral (valve) insufficiency: Secondary | ICD-10-CM | POA: Diagnosis not present

## 2016-05-19 DIAGNOSIS — J189 Pneumonia, unspecified organism: Secondary | ICD-10-CM

## 2016-05-19 DIAGNOSIS — G9340 Encephalopathy, unspecified: Secondary | ICD-10-CM | POA: Diagnosis present

## 2016-05-19 DIAGNOSIS — F0390 Unspecified dementia without behavioral disturbance: Secondary | ICD-10-CM

## 2016-05-19 DIAGNOSIS — I669 Occlusion and stenosis of unspecified cerebral artery: Secondary | ICD-10-CM

## 2016-05-19 DIAGNOSIS — I110 Hypertensive heart disease with heart failure: Secondary | ICD-10-CM | POA: Diagnosis present

## 2016-05-19 LAB — URINE MICROSCOPIC-ADD ON

## 2016-05-19 LAB — COMPREHENSIVE METABOLIC PANEL
ALT: 483 U/L — ABNORMAL HIGH (ref 17–63)
ANION GAP: 7 (ref 5–15)
AST: 228 U/L — ABNORMAL HIGH (ref 15–41)
Albumin: 3.3 g/dL — ABNORMAL LOW (ref 3.5–5.0)
Alkaline Phosphatase: 105 U/L (ref 38–126)
BUN: 24 mg/dL — ABNORMAL HIGH (ref 6–20)
CHLORIDE: 107 mmol/L (ref 101–111)
CO2: 26 mmol/L (ref 22–32)
CREATININE: 0.97 mg/dL (ref 0.61–1.24)
Calcium: 9.8 mg/dL (ref 8.9–10.3)
Glucose, Bld: 87 mg/dL (ref 65–99)
POTASSIUM: 5.3 mmol/L — AB (ref 3.5–5.1)
Sodium: 140 mmol/L (ref 135–145)
Total Bilirubin: 0.4 mg/dL (ref 0.3–1.2)
Total Protein: 6.9 g/dL (ref 6.5–8.1)

## 2016-05-19 LAB — CBC WITH DIFFERENTIAL/PLATELET
Basophils Absolute: 0 10*3/uL (ref 0.0–0.1)
Basophils Relative: 0 %
EOS ABS: 0 10*3/uL (ref 0.0–0.7)
Eosinophils Relative: 0 %
HCT: 38.4 % — ABNORMAL LOW (ref 39.0–52.0)
HEMOGLOBIN: 13 g/dL (ref 13.0–17.0)
LYMPHS ABS: 1.2 10*3/uL (ref 0.7–4.0)
LYMPHS PCT: 14 %
MCH: 28.8 pg (ref 26.0–34.0)
MCHC: 33.9 g/dL (ref 30.0–36.0)
MCV: 85 fL (ref 78.0–100.0)
Monocytes Absolute: 0.4 10*3/uL (ref 0.1–1.0)
Monocytes Relative: 5 %
NEUTROS PCT: 81 %
Neutro Abs: 7.1 10*3/uL (ref 1.7–7.7)
Platelets: 133 10*3/uL — ABNORMAL LOW (ref 150–400)
RBC: 4.52 MIL/uL (ref 4.22–5.81)
RDW: 15.4 % (ref 11.5–15.5)
WBC: 8.8 10*3/uL (ref 4.0–10.5)

## 2016-05-19 LAB — URINALYSIS, ROUTINE W REFLEX MICROSCOPIC
Bilirubin Urine: NEGATIVE
Glucose, UA: NEGATIVE mg/dL
Ketones, ur: NEGATIVE mg/dL
Nitrite: NEGATIVE
Protein, ur: >300 mg/dL — AB
SPECIFIC GRAVITY, URINE: 1.023 (ref 1.005–1.030)
pH: 5.5 (ref 5.0–8.0)

## 2016-05-19 LAB — I-STAT CG4 LACTIC ACID, ED
LACTIC ACID, VENOUS: 0.87 mmol/L (ref 0.5–2.0)
LACTIC ACID, VENOUS: 1.9 mmol/L (ref 0.5–2.0)

## 2016-05-19 LAB — APTT: APTT: 53 s — AB (ref 24–37)

## 2016-05-19 LAB — PROCALCITONIN: Procalcitonin: 0.13 ng/mL

## 2016-05-19 LAB — LACTIC ACID, PLASMA: Lactic Acid, Venous: 1.4 mmol/L (ref 0.5–2.0)

## 2016-05-19 LAB — CBG MONITORING, ED: Glucose-Capillary: 115 mg/dL — ABNORMAL HIGH (ref 65–99)

## 2016-05-19 LAB — PROTIME-INR
INR: 1.27 (ref 0.00–1.49)
Prothrombin Time: 16 seconds — ABNORMAL HIGH (ref 11.6–15.2)

## 2016-05-19 LAB — LIPASE, BLOOD: LIPASE: 18 U/L (ref 11–51)

## 2016-05-19 LAB — VALPROIC ACID LEVEL: VALPROIC ACID LVL: 14 ug/mL — AB (ref 50.0–100.0)

## 2016-05-19 MED ORDER — PANTOPRAZOLE SODIUM 40 MG PO PACK: 40.0000 mg | PACK | Freq: Every day | ORAL | Status: DC

## 2016-05-19 MED ORDER — SODIUM CHLORIDE 0.9 % IV SOLN
INTRAVENOUS | Status: DC
  Administered 2016-05-19: 21:00:00 via INTRAVENOUS

## 2016-05-19 MED ORDER — VITAMIN B-1 100 MG PO TABS: 100.0000 mg | ORAL_TABLET | Freq: Every day | ORAL | Status: DC

## 2016-05-19 MED ORDER — LEVETIRACETAM 100 MG/ML PO SOLN: 500.0000 mg | Freq: Two times a day (BID) | ORAL | Status: DC

## 2016-05-19 MED ORDER — SODIUM CHLORIDE 0.9 % IV SOLN: 500.0000 mg | Freq: Two times a day (BID) | INTRAVENOUS | Status: DC

## 2016-05-19 MED ORDER — SODIUM CHLORIDE 0.9 % IV SOLN
Freq: Once | INTRAVENOUS | Status: AC
Start: 2016-05-19 — End: 2016-05-19
  Administered 2016-05-19: 20:00:00 via INTRAVENOUS

## 2016-05-19 MED ORDER — ENOXAPARIN SODIUM 40 MG/0.4ML ~~LOC~~ SOLN: 40.0000 mg | SUBCUTANEOUS | Status: DC

## 2016-05-19 MED ORDER — LORAZEPAM 1 MG PO TABS
0.5000 mg | ORAL_TABLET | Freq: Three times a day (TID) | ORAL | Status: DC | PRN
Start: 2016-05-19 — End: 2016-05-20

## 2016-05-19 MED ORDER — HYDROCORTISONE NA SUCCINATE PF 100 MG IJ SOLR: 50.0000 mg | Freq: Three times a day (TID) | INTRAMUSCULAR | Status: DC

## 2016-05-19 MED ORDER — ATORVASTATIN CALCIUM 10 MG PO TABS
20.0000 mg | ORAL_TABLET | Freq: Every day | ORAL | Status: DC
Start: 2016-05-20 — End: 2016-05-20

## 2016-05-19 MED ORDER — DEXTROSE 5 % IV SOLN
2.0000 g | Freq: Once | INTRAVENOUS | Status: AC
  Administered 2016-05-19: 2 g via INTRAVENOUS
  Filled 2016-05-19: qty 2

## 2016-05-19 MED ORDER — FOLIC ACID 1 MG PO TABS: 1.0000 mg | ORAL_TABLET | Freq: Every day | ORAL | Status: DC

## 2016-05-19 MED ORDER — SODIUM CHLORIDE 0.9 % IV BOLUS (SEPSIS)
1000.0000 mL | Freq: Once | INTRAVENOUS | Status: AC
  Administered 2016-05-19: 1000 mL via INTRAVENOUS

## 2016-05-19 MED ORDER — SODIUM CHLORIDE 0.9 % IV BOLUS (SEPSIS)
250.0000 mL | Freq: Once | INTRAVENOUS | Status: AC
  Administered 2016-05-19: 250 mL via INTRAVENOUS

## 2016-05-19 MED ORDER — SODIUM CHLORIDE 0.9 % IV SOLN: 750.0000 mg | Freq: Two times a day (BID) | INTRAVENOUS | Status: DC

## 2016-05-19 MED ORDER — SODIUM CHLORIDE 0.9 % IV SOLN
1000.0000 mg | Freq: Once | INTRAVENOUS | Status: AC
  Administered 2016-05-19: 1000 mg via INTRAVENOUS
  Filled 2016-05-19: qty 10

## 2016-05-19 MED ORDER — SODIUM CHLORIDE 0.9 % IV BOLUS (SEPSIS)
500.0000 mL | Freq: Once | INTRAVENOUS | Status: AC
  Administered 2016-05-19: 500 mL via INTRAVENOUS

## 2016-05-19 MED ORDER — VANCOMYCIN HCL IN DEXTROSE 1-5 GM/200ML-% IV SOLN
1000.0000 mg | Freq: Once | INTRAVENOUS | Status: AC
  Administered 2016-05-19: 1000 mg via INTRAVENOUS
  Filled 2016-05-19: qty 200

## 2016-05-19 MED ORDER — HYDROCORTISONE NA SUCCINATE PF 100 MG IJ SOLR
100.0000 mg | Freq: Once | INTRAMUSCULAR | Status: AC
  Administered 2016-05-19: 100 mg via INTRAVENOUS
  Filled 2016-05-19: qty 2

## 2016-05-19 MED ORDER — VALPROATE SODIUM 500 MG/5ML IV SOLN
500.0000 mg | Freq: Once | INTRAVENOUS | Status: AC
  Administered 2016-05-19: 500 mg via INTRAVENOUS
  Filled 2016-05-19: qty 5

## 2016-05-19 MED ORDER — ALBUTEROL SULFATE (2.5 MG/3ML) 0.083% IN NEBU: 2.5000 mg | INHALATION_SOLUTION | RESPIRATORY_TRACT | Status: DC | PRN

## 2016-05-19 NOTE — ED Notes (Signed)
89.6 RECTAL TEMP, BAIR HUGGER ON HIGH. DR.YELVERTON AWARE.

## 2016-05-19 NOTE — ED Notes (Signed)
US at bedside

## 2016-05-19 NOTE — H&P (Addendum)
History and Physical    Dale [name]:[id number] DOB: July 16, 1966 DOA: 05/19/2016   PCP: Estill Dooms, MD Chief Complaint:  Chief Complaint  Patient presents with  . Seizures    HPI: Dale [name] is a 50 y.o. male with medical history significant of homelessness, HTN, and recent strep meningitis with complicated course who presents with failure of PICC line and unable to care for self at home.  The patient was homeless been in his usual state of health until March of this year when he presented with septic shock from strep pneumo meningitis.   3/14-4/6: Admitted with strep pneumo meningitis, c/b cardiac arrest, and cardiogenic shock (EF 20%, recovered to 50%) briefly on amiodarone, ARDS requiring prolonged intubation and trach (now removed), ARF, shock liver, and adrenal insufficiency. He was discharged on 4/6 with weekly [facility] IM until 4/10  4/16-5/1: Readmitted with fall, altered mental status. Repeat LP had no growth, TEE without vegetation, ultimately transitioned to ceftriaxone 2g BID with plans for >4 weeks IV antibiotics. During this hospitalization, he was planning for SNF placement until the day of discharge when the patient's brother asked to take him home (it appears that this wasn't verified with the patient's estranged but legally married wife).  Since discharge, patient has returned to the ED 4 times because of loss of PICC line, PICC requiring to be flushed, PICC coming out, or patient wandering unattended in the street (he has dementia since his meningitis).  5/24-5/26: Readmitted for placement to SNF, given additional week of rocephin and was supposed to follow up with ID before stopping rocephin.  Today (6/15) patient is sent in from his NH due to decreased mentation (normally walks, non-verbal at baseline), today he was not ambulatory.  He finally had a seizure which prompted them to send him in to the ED.  They did not give him his seizure meds today apparently  per report.  ED Course: In the ED patient was given 1gm of keppra and depacon load.  He was very hypothermic with PR temperature of 87, re-warming and sepsis work up initiated.  Given meningitis doses of rocephin and vanc.  He also has transaminates, although his alk phos is normal and US shows stones and sludge but no gallbladder wall thickening or ductal abnormalities.  Patient is unable to provide any history.  Review of Systems: Patient is unable to provide any history   Past Medical History  Diagnosis Date  . Depression   . Cardiac arrest ([place])   . Cardiogenic shock ([place])   . Acute respiratory failure with hypoxia ([place])   . Meningitis, streptococcal 02/16/2016  . 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 Tobacco abuse 02/16/2016  . Substance abuse   . ETOH abuse   . Septic shock ([place])   . Urinary retention 05/04/2016    Urinary retention - Continue Flomax     . Systolic and diastolic CHF, chronic ([place])   . 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 Severe sepsis ([place])   . Respiratory failure ([place]) 03/03/2016  . Protein-calorie malnutrition, severe ([place])   . NSTEMI (non-ST elevated myocardial infarction) ([place]) 03/14/2016  . Meningoencephalitis   . Meningitis, pneumococcal, recurrent   . Low serum cortisol level (HCC) 05/04/2016    Low a.m. cortisol  - Was 1.2 checked on 04/03/2016  - This likely is reflective of cortisol suppression from steroid use - Tapered steroids off   . Hypothermia 03/20/2016  . Hypernatremia   . Hyperlipidemia 05/04/2016  Dyslipidemia - Continue statin.     Marland Kitchen Hydrocephalus   . Dysphagia 05/04/2016    Dysphagia - Dysphagia 1 diet per SLP     . Dementia 04/27/2016  . Convulsions/seizures ([place]) 05/04/2016    Seizure disorder - Continue Keppra, Depakote.     . CHF (congestive heart failure) ([place])   . Cerebral thrombosis with cerebral infarction 03/25/2016  . Cerebral septic emboli ([place])   . Altered mental status   . AKI (acute kidney injury) ([place]) 02/16/2016   . Acute renal failure ([place])   . Acute ischemic stroke ([place])   . Acute encephalopathy     Past Surgical History  Procedure Laterality Date  . Ankle surgery    . Cardiac catheterization N/A 02/17/2016    Procedure: IABP Insertion;  Surgeon: Wellington Hampshire, MD;  Location: [facility];  Service: Cardiovascular;  Laterality: N/A;  . Tee without cardioversion N/A 03/29/2016    Procedure: TRANSESOPHAGEAL ECHOCARDIOGRAM (TEE);  Surgeon: Larey Dresser, MD;  Location: [facility] ENDOSCOPY;  Service: Cardiovascular;  Laterality: N/A;     reports that he has been smoking Cigarettes.  He has been smoking about 0.10 packs per day. He does not have any smokeless tobacco history on file. He reports that he uses illicit drugs (Marijuana). He reports that he does not drink alcohol.  No Known Allergies  History reviewed. No pertinent family history. no family history of recurrent bacterial meningitis recently.   Prior to Admission medications   Medication Sig Start Date End Date Taking? Authorizing Provider  albuterol (PROVENTIL) (2.5 MG/3ML) 0.083% nebulizer solution Take 3 mLs (2.5 mg total) by nebulization every 3 (three) hours as needed for wheezing. Patient taking differently: Take 2.5 mg by nebulization every 3 (three) hours as needed for wheezing or shortness of breath.  03/10/16  Yes Reyne Dumas, MD  aspirin 325 MG tablet Place 1 tablet (325 mg total) into feeding tube daily. Patient taking differently: Take 325 mg by mouth daily.  04/04/16  Yes Robbie Lis, MD  atorvastatin (LIPITOR) 20 MG tablet Take 1 tablet (20 mg total) by mouth daily at 6 PM. 04/04/16  Yes Robbie Lis, MD  carvedilol (COREG) 3.125 MG tablet Place 1 tablet (3.125 mg total) into feeding tube 2 (two) times daily with a meal. Patient taking differently: Take 3.125 mg by mouth 2 (two) times daily with a meal.  04/04/16  Yes Robbie Lis, MD  famotidine (PEPCID) 40 MG/5ML suspension Place 2.5 mLs (20 mg total) into feeding tube  daily. Patient taking differently: Take 20 mg by mouth daily.  04/04/16  Yes Robbie Lis, MD  feeding supplement, ENSURE ENLIVE, (ENSURE ENLIVE) LIQD Take 237 mLs by mouth every 6 (six) hours. 04/04/16  Yes Robbie Lis, MD  folic acid (FOLVITE) 1 MG tablet Place 1 tablet (1 mg total) into feeding tube daily. Patient taking differently: Take 1 mg by mouth daily.  04/04/16  Yes Robbie Lis, MD  HEPARIN LOCK FLUSH IV Inject 3 mLs into the vein 2 (two) times daily. Flush after use   Yes Historical Provider, MD  levETIRAcetam (KEPPRA) 100 MG/ML solution Place 5 mLs (500 mg total) into feeding tube 2 (two) times daily. Patient taking differently: Take 500 mg by mouth 2 (two) times daily.  04/04/16  Yes Robbie Lis, MD  LORazepam (ATIVAN) 0.5 MG tablet Take 0.5 mg by mouth every 8 (eight) hours as needed for anxiety or sedation.   Yes Historical Provider, MD  tamsulosin (FLOMAX) 0.4 MG CAPS capsule Take 1 capsule (0.4 mg total) by mouth daily. 04/04/16  Yes Robbie Lis, MD  thiamine 100 MG tablet Place 1 tablet (100 mg total) into feeding tube daily. Patient taking differently: Take 100 mg by mouth daily.  04/04/16  Yes Robbie Lis, MD  TUBERCULIN PPD ID Inject 1 mL into the skin once.   Yes Historical Provider, MD  valproic acid (DEPAKENE) 250 MG/5ML syrup Place 5 mLs (250 mg total) into feeding tube every 8 (eight) hours. Patient taking differently: Take 500 mg by mouth 2 (two) times daily.  04/04/16  Yes Robbie Lis, MD    Physical Exam: Filed Vitals:   05/19/16 2010 05/19/16 2030 05/19/16 2033 05/19/16 2035  BP: 1'03/88 99/72 99/72 '$ 95/69  Pulse: 75 80 80 80  Temp:   90.7 F (32.6 C) 90.9 F (32.7 C)  TempSrc:   Other (Comment)   Resp: '16 19  17  '$ SpO2: 98% 99% 98% 99%      Constitutional: Chronically ill appearing Eyes: PERRL, lids and conjunctivae normal, left gaze preference, this appears to have been documented previously based on the neurology note of 4/22 ENMT: Mucous membranes are  moist. Posterior pharynx clear of any exudate or lesions.Normal dentition.  Neck: normal, supple, no masses, no thyromegaly Respiratory: clear to auscultation bilaterally, no wheezing, no crackles. Normal respiratory effort. No accessory muscle use.  Cardiovascular: Regular rate and rhythm, no murmurs / rubs / gallops. No extremity edema. 2+ pedal pulses. No carotid bruits.  Abdomen: no tenderness, no masses palpated. No hepatosplenomegaly. Bowel sounds positive.  Musculoskeletal: no clubbing / cyanosis. No joint deformity upper and lower extremities. Good ROM, no contractures. Normal muscle tone.  Skin: no rashes, lesions, ulcers. No induration Neurologic: Eye deviation to left, dosent obey commands, spontaneous eye opening, Not doing a whole lot with the R side of his body, think that much of this may be chronic based on neurology notes in April of this year.  Non-verbal for [place], apparently did tell his son "get out of my face" when son was here earlier in the evening. Psychiatric: Unable to assess due to neuro status   Labs on Admission: I have personally reviewed following labs and imaging studies  CBC:  Recent Labs Lab 05/19/16 1505  WBC 8.8  NEUTROABS 7.1  HGB 13.0  HCT 38.4*  MCV 85.0  PLT 026*   Basic Metabolic Panel:  Recent Labs Lab 05/19/16 1505  NA 140  K 5.3*  CL 107  CO2 26  GLUCOSE 87  BUN 24*  CREATININE 0.97  CALCIUM 9.8   GFR: Estimated Creatinine Clearance: 71.1 mL/min (by C-G formula based on Cr of 0.97). Liver Function Tests:  Recent Labs Lab 05/19/16 1505  AST 228*  ALT 483*  ALKPHOS 105  BILITOT 0.4  PROT 6.9  ALBUMIN 3.3*    Recent Labs Lab 05/19/16 1505  LIPASE 18   No results for input(s): AMMONIA in the last 168 hours. Coagulation Profile: No results for input(s): INR, PROTIME in the last 168 hours. Cardiac Enzymes: No results for input(s): CKTOTAL, CKMB, CKMBINDEX, TROPONINI in the last 168 hours. BNP (last 3 results) No  results for input(s): PROBNP in the last 8760 hours. HbA1C: No results for input(s): HGBA1C in the last 72 hours. CBG:  Recent Labs Lab 05/19/16 1848  GLUCAP 115*   Lipid Profile: No results for input(s): CHOL, HDL, LDLCALC, TRIG, CHOLHDL, LDLDIRECT in the last 72 hours. Thyroid Function Tests:  No results for input(s): TSH, T4TOTAL, FREET4, T3FREE, THYROIDAB in the last 72 hours. Anemia Panel: No results for input(s): VITAMINB12, FOLATE, FERRITIN, TIBC, IRON, RETICCTPCT in the last 72 hours. Urine analysis:    Component Value Date/Time   COLORURINE AMBER* 05/19/2016 1840   APPEARANCEUR TURBID* 05/19/2016 1840   LABSPEC 1.023 05/19/2016 1840   PHURINE 5.5 05/19/2016 1840   GLUCOSEU NEGATIVE 05/19/2016 1840   HGBUR LARGE* 05/19/2016 1840   BILIRUBINUR NEGATIVE 05/19/2016 1840   KETONESUR NEGATIVE 05/19/2016 1840   PROTEINUR >300* 05/19/2016 1840   UROBILINOGEN 1.0 11/13/2011 2010   NITRITE NEGATIVE 05/19/2016 1840   LEUKOCYTESUR MODERATE* 05/19/2016 1840   Sepsis Labs: '@LABRCNTIP'$ (procalcitonin:4,lacticidven:4) )No results found for this or any previous visit (from the past 240 hour(s)).   Radiological Exams on Admission: Ct Head Wo Contrast  05/19/2016  CLINICAL DATA:  Seizure.  Recent diagnosis of meningitis.  Dementia. EXAM: CT HEAD WITHOUT CONTRAST TECHNIQUE: Contiguous axial images were obtained from the base of the skull through the vertex without intravenous contrast. COMPARISON:  03/22/2016 FINDINGS: Brain: Mild diffuse atrophy as before. No evidence of acute infarction, hemorrhage, extra-axial collection, ventriculomegaly, or mass effect. Vascular: No hyperdense vessel or unexpected calcification. Skull: Negative for fracture or focal lesion. Sinuses/Orbits: No acute findings. Other: None. IMPRESSION: 1. Mild atrophy. Negative for bleed or other acute intracranial process. Electronically Signed   By: Lucrezia Europe M.D.   On: 05/19/2016 15:56   US Abdomen  Limited  05/19/2016  CLINICAL DATA:  Abnormal liver function tests. EXAM: US ABDOMEN LIMITED - RIGHT UPPER QUADRANT COMPARISON:  11/13/2011 CT scan FINDINGS: Gallbladder: Multiple tiny gallstones are present measuring up to 3 mm diameter. These cause mild shadowing. There also appears to be tumefactive sludge in the gallbladder especially along the fundus, surrounding a small gallstone. This is mobile and does not demonstrate internal blood flow to suggest a polyp. There could be smaller polyps along the gallbladder wall given the specular tiny echogenicities along the gallbladder wall. Common bile duct: Diameter: 3 mm Liver: No focal lesion identified. Within normal limits in parenchymal echogenicity. IMPRESSION: 1. Multiple small gallstones in the gallbladder. There is some tumefactive sludge surrounding one of the gallstones, mobile within the gallbladder. The other gallstones are in the 3 mm size range. There may be some tiny polyps along the gallbladder wall but none measuring more than 3 mm in diameter. Hyperplastic cholecystosis with superimposed sludge and tiny gallstones not excluded. No overt gallbladder wall thickening. Sonographic Murphy's sign absent. Electronically Signed   By: Van Clines M.D.   On: 05/19/2016 18:57   Dg Chest Port 1 View  05/19/2016  CLINICAL DATA:  Hypothermia EXAM: PORTABLE CHEST 1 VIEW COMPARISON:  03/20/2016 FINDINGS: The patient is rotated to the left on today's radiograph, reducing diagnostic sensitivity and specificity. Right PICC line tip: SVC. The lungs appear clear. Cardiac and mediastinal contours normal. No pleural effusion identified. IMPRESSION: 1. No acute findings. 2. Right PICC line tip:  SVC. Electronically Signed   By: Van Clines M.D.   On: 05/19/2016 15:04    EKG: Independently reviewed.  Assessment/Plan Principal Problem:   Septic shock (HCC) Active Problems:   Acute encephalopathy   Meningitis, pneumococcal, recurrent   Cerebral  septic emboli (HCC)   Dementia   Convulsions/seizures (HCC)   Dysphagia   Hypothermia   Septic shock -  Rocephin and vanc at meningitis doses  Worrisome picture for possibly recurrent pneumococcal meningitis again.  Cultures ordered  No CSF studies done at  this time due to patients critically ill condition  Got 26m/kg bolus, then gave additional 1L bolus of warmed fluids  Sepsis pathway  Procalcitonin pending  Initial lactate is actually not elevated  Hypothermia, severe with initial core temperature of 87 - due to sepsis most likely, rewarming patient now  temp foley in place to monitor  Bair hugger  warm IVF  Acute encephalopathy on chronic neuro deficits / vascular dementia -  Due to #1 above  Seizures -  Got keppra and depakote in ED  Continuing keppra, holding depakote due to LFTs, will up keppra to '750mg'$  BID though.  Seizure precautions  Dysphagia - has peg tube  Dietary consult   CRITICAL CARE Performed by: GEtta Quill   Total critical care time: 45 minutes  Critical care time was exclusive of separately billable procedures and treating other patients.  Critical care was necessary to treat or prevent imminent or life-threatening deterioration.  Critical care was time spent personally by me on the following activities: development of treatment plan with patient and/or surrogate as well as nursing, discussions with consultants, evaluation of patient's response to treatment, examination of patient, obtaining history from patient or surrogate, ordering and performing treatments and interventions, ordering and review of laboratory studies, ordering and review of radiographic studies, pulse oximetry and re-evaluation of patient's condition.   DVT prophylaxis: Lovenox when he warms up Code Status: Full, needs pal care consult again tomorrow, but thus far we have been unsuccessful in getting family to change code status Family Communication: No family in  room Consults called: Spoke with PCCM informally, talked with Dr. MLake Bellsto give him a heads up about this patient who is at high risk of decompensation Admission status: Admit to inpatient   GEtta QuillDO [facility] Pager [phone] 7PM-7AM  If 7AM-7PM, please contact the day physician for the patient [email or web address] Password [email or web address]  05/19/2016, 8:59 PM

## 2016-05-19 NOTE — ED Notes (Signed)
Pt having convulsions, eyes focused to the left, pts tongue sticking out of mouth.  MD made aware.  Dr. Verdie MosherLiu at bedside.  Symptoms had resolved.  Seizure medications started and comfort measures for patient done.

## 2016-05-19 NOTE — ED Notes (Signed)
Spoke to lab.  Will add lipase.   

## 2016-05-19 NOTE — ED Notes (Signed)
Foley site area appears fine. 

## 2016-05-19 NOTE — ED Notes (Signed)
Dr. Julian ReilGardner and Kendal HymenBonnie - RN informed and aware of pt's BP.

## 2016-05-19 NOTE — ED Notes (Signed)
Dale SkinnerShirley Bartelt (wife) - [phone] - [phone] Mariane MastersCarlton Brueggemann (son) - [phone]  Please call with any updates

## 2016-05-19 NOTE — ED Notes (Signed)
Pt's CBG result was 115. Informed Katie - RN.

## 2016-05-19 NOTE — ED Provider Notes (Signed)
CSN: [id number]     Arrival date & time 05/19/16  1418 History   First MD Initiated Contact with Patient 05/19/16 1504     Chief Complaint  Patient presents with  . Seizures     (Consider location/radiation/quality/duration/timing/severity/associated sxs/prior Treatment) HPI  Level V caveat due to dementia.  50 year old male who presents with seizure. History of dementia and seizure disorder on Keppra and valproic acid. Recent history of recurrent streptococcal meningitis recently finished course of ceftriaxone via PICC as outpatient. Per EMS, patient residing at [facility]. Had grand mal seizure today and not been ambulating. Baseline non-verbal but ambulatory. Per nursing home, did not receive seizure medications.     Past Medical History  Diagnosis Date  . Depression   . Cardiac arrest (HCC)   . Cardiogenic shock (HCC)   . Acute respiratory failure with hypoxia (HCC)   . Meningitis, streptococcal 02/16/2016  . 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 Tobacco abuse 02/16/2016  . Substance abuse   . ETOH abuse   . Septic shock (HCC)   . Urinary retention 05/04/2016    Urinary retention - Continue Flomax     . Systolic and diastolic CHF, chronic (HCC)   . 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 Severe sepsis (HCC)   . Respiratory failure (HCC) 03/03/2016  . Protein-calorie malnutrition, severe (HCC)   . NSTEMI (non-ST elevated myocardial infarction) (HCC) 03/14/2016  . Meningoencephalitis   . Meningitis, pneumococcal, recurrent   . Low serum cortisol level (HCC) 05/04/2016    Low a.m. cortisol  - Was 1.2 checked on 04/03/2016  - This likely is reflective of cortisol suppression from steroid use - Tapered steroids off   . Hypothermia 03/20/2016  . Hypernatremia   . Hyperlipidemia 05/04/2016    Dyslipidemia - Continue statin.     Marland Kitchen Hydrocephalus   . Dysphagia 05/04/2016    Dysphagia - Dysphagia 1 diet per SLP     . Dementia 04/27/2016  . Convulsions/seizures (HCC) 05/04/2016     Seizure disorder - Continue Keppra, Depakote.     . CHF (congestive heart failure) (HCC)   . Cerebral thrombosis with cerebral infarction 03/25/2016  . Cerebral septic emboli (HCC)   . Altered mental status   . AKI (acute kidney injury) (HCC) 02/16/2016  . Acute renal failure (HCC)   . Acute ischemic stroke (HCC)   . Acute encephalopathy    Past Surgical History  Procedure Laterality Date  . Ankle surgery    . Cardiac catheterization N/A 02/17/2016    Procedure: IABP Insertion;  Surgeon: Iran Ouch, MD;  [facility];  Service: Cardiovascular;  Laterality: N/A;  . Tee without cardioversion N/A 03/29/2016    Procedure: TRANSESOPHAGEAL ECHOCARDIOGRAM (TEE);  Surgeon: Laurey Morale, MD;  Location[facility] ENDOSCOPY;  Service: Cardiovascular;  Laterality: N/A;   History reviewed. No pertinent family history. Social History  Substance Use Topics  . Smoking status: Current Every Day Smoker -- 0.10 packs/day    Types: Cigarettes  . Smokeless tobacco: None  . Alcohol Use: No    Review of Systems  Unable to perform ROS: Patient nonverbal      Allergies  Review of patient's allergies indicates no known allergies.  Home Medications   Prior to Admission medications   Medication Sig Start Date End Date Taking? Authorizing Provider  albuterol (PROVENTIL) (2.5 MG/3ML) 0.083% nebulizer solution Take 3 mLs (2.5 mg total) by nebulization every 3 (three) hours as needed for wheezing. Patient taking differently:  Take 2.5 mg by nebulization every 3 (three) hours as needed for wheezing or shortness of breath.  03/10/16  Yes Richarda OverlieNayana Abrol, MD  aspirin 325 MG tablet Place 1 tablet (325 mg total) into feeding tube daily. Patient taking differently: Take 325 mg by mouth daily.  04/04/16  Yes Alison MurrayAlma M Devine, MD  atorvastatin (LIPITOR) 20 MG tablet Take 1 tablet (20 mg total) by mouth daily at 6 PM. 04/04/16  Yes Alison MurrayAlma M Devine, MD  carvedilol (COREG) 3.125 MG tablet Place 1 tablet (3.125 mg  total) into feeding tube 2 (two) times daily with a meal. Patient taking differently: Take 3.125 mg by mouth 2 (two) times daily with a meal.  04/04/16  Yes Alison MurrayAlma M Devine, MD  famotidine (PEPCID) 40 MG/5ML suspension Place 2.5 mLs (20 mg total) into feeding tube daily. Patient taking differently: Take 20 mg by mouth daily.  04/04/16  Yes Alison MurrayAlma M Devine, MD  feeding supplement, ENSURE ENLIVE, (ENSURE ENLIVE) LIQD Take 237 mLs by mouth every 6 (six) hours. 04/04/16  Yes Alison MurrayAlma M Devine, MD  folic acid (FOLVITE) 1 MG tablet Place 1 tablet (1 mg total) into feeding tube daily. Patient taking differently: Take 1 mg by mouth daily.  04/04/16  Yes Alison MurrayAlma M Devine, MD  HEPARIN LOCK FLUSH IV Inject 3 mLs into the vein 2 (two) times daily. Flush after use   Yes Historical Provider, MD  levETIRAcetam (KEPPRA) 100 MG/ML solution Place 5 mLs (500 mg total) into feeding tube 2 (two) times daily. Patient taking differently: Take 500 mg by mouth 2 (two) times daily.  04/04/16  Yes Alison MurrayAlma M Devine, MD  LORazepam (ATIVAN) 0.5 MG tablet Take 0.5 mg by mouth every 8 (eight) hours as needed for anxiety or sedation.   Yes Historical Provider, MD  tamsulosin (FLOMAX) 0.4 MG CAPS capsule Take 1 capsule (0.4 mg total) by mouth daily. 04/04/16  Yes Alison MurrayAlma M Devine, MD  thiamine 100 MG tablet Place 1 tablet (100 mg total) into feeding tube daily. Patient taking differently: Take 100 mg by mouth daily.  04/04/16  Yes Alison MurrayAlma M Devine, MD  TUBERCULIN PPD ID Inject 1 mL into the skin once.   Yes Historical Provider, MD  valproic acid (DEPAKENE) 250 MG/5ML syrup Place 5 mLs (250 mg total) into feeding tube every 8 (eight) hours. Patient taking differently: Take 500 mg by mouth 2 (two) times daily.  04/04/16  Yes Alison MurrayAlma M Devine, MD   BP 108/81 mmHg  Pulse 62  Temp(Src) 90 F (32.2 C) (Core (Comment))  Resp 15  SpO2 98% Physical Exam Physical Exam  Nursing note and vitals reviewed. Constitutional: Chronically ill appearing, non-toxic, and in no acute  distress Head: Normocephalic and atraumatic.  Mouth/Throat: Oropharynx is clear and moist.  Neck: Normal range of motion. Neck supple.  Cardiovascular: Normal rate and regular rhythm.   Pulmonary/Chest: Effort normal and breath sounds normal.  Abdominal: Soft. There is no tenderness. There is no rebound and no guarding.  Musculoskeletal: No deformities Neurological: Alert, non-verbal, does not obey commands Skin: Skin is warm and dry.  Psychiatric: Cooperative   ED Course  Procedures (including critical care time) Labs Review Labs Reviewed  COMPREHENSIVE METABOLIC PANEL - Abnormal; Notable for the following:    Potassium 5.3 (*)    BUN 24 (*)    Albumin 3.3 (*)    AST 228 (*)    ALT 483 (*)    All other components within normal limits  CBC WITH DIFFERENTIAL/PLATELET -  Abnormal; Notable for the following:    HCT 38.4 (*)    Platelets 133 (*)    All other components within normal limits  CULTURE, BLOOD (ROUTINE X 2)  CULTURE, BLOOD (ROUTINE X 2)  URINE CULTURE  URINALYSIS, ROUTINE W REFLEX MICROSCOPIC (NOT AT [facility])  VALPROIC ACID LEVEL  I-STAT CG4 LACTIC ACID, ED  CBG MONITORING, ED    Imaging Review Ct Head Wo Contrast  05/19/2016  CLINICAL DATA:  Seizure.  Recent diagnosis of meningitis.  Dementia. EXAM: CT HEAD WITHOUT CONTRAST TECHNIQUE: Contiguous axial images were obtained from the base of the skull through the vertex without intravenous contrast. COMPARISON:  03/22/2016 FINDINGS: Brain: Mild diffuse atrophy as before. No evidence of acute infarction, hemorrhage, extra-axial collection, ventriculomegaly, or mass effect. Vascular: No hyperdense vessel or unexpected calcification. Skull: Negative for fracture or focal lesion. Sinuses/Orbits: No acute findings. Other: None. IMPRESSION: 1. Mild atrophy. Negative for bleed or other acute intracranial process. Electronically Signed   By: Corlis Leak M.D.   On: 05/19/2016 15:56   Dg Chest Port 1 View  05/19/2016  CLINICAL DATA:   Hypothermia EXAM: PORTABLE CHEST 1 VIEW COMPARISON:  03/20/2016 FINDINGS: The patient is rotated to the left on today's radiograph, reducing diagnostic sensitivity and specificity. Right PICC line tip: SVC. The lungs appear clear. Cardiac and mediastinal contours normal. No pleural effusion identified. IMPRESSION: 1. No acute findings. 2. Right PICC line tip:  SVC. Electronically Signed   By: Gaylyn Rong M.D.   On: 05/19/2016 15:04   I have personally reviewed and evaluated these images and lab results as part of my medical decision-making.   EKG Interpretation   Date/Time:  Thursday May 19 2016 14:22:46 EDT Ventricular Rate:  61 PR Interval:  212 QRS Duration: 110 QT Interval:  467 QTC Calculation: 470 R Axis:   101 Text Interpretation:  Sinus rhythm Prolonged PR interval Right atrial  enlargement Right axis deviation no acute changes  Confirmed by Coltan Spinello MD,  Annabelle Harman ([id number]) on 05/19/2016 2:27:28 PM      MDM   Final diagnoses:  Hypothermia, initial encounter  Seizure (HCC)   50 year old male with history of dementia and recent history of recurrent streptococcal meningitis (now finished with antibiotics) who presents with seizure in setting of likely not receiving anti-epileptics today. Seems to be at his mental status baseline on arrival. Is hypothermic 87.74F and bair hugger applied. Remainder of vital signs normal. Septic work-up was initiated given hypothermia and PICC line in place.  Did receive IVF and empiric antibiotics while pending work-up. CXR clear, normal lactate and WBC. No major electrolyte or metabolic derangements. Loaded with keppra and depakote. Infectious evaluation pending and signed out to Dr. Ranae Palms.   Lavera Guise, MD 05/19/16 [phone]

## 2016-05-19 NOTE — ED Notes (Signed)
Per EMS report. Call placed by staff at [facility] to report PT seizure ,Dale BalzarineGrand Mal , per nursing home staff. At base line Pt is does not speak. The staff does report that pt does ambulate , but has been unable to Ambulate today.

## 2016-05-20 ENCOUNTER — Inpatient Hospital Stay (HOSPITAL_COMMUNITY): Payer: Medicaid Other

## 2016-05-20 DIAGNOSIS — R569 Unspecified convulsions: Secondary | ICD-10-CM

## 2016-05-20 DIAGNOSIS — A419 Sepsis, unspecified organism: Principal | ICD-10-CM

## 2016-05-20 DIAGNOSIS — R740 Nonspecific elevation of levels of transaminase and lactic acid dehydrogenase [LDH]: Secondary | ICD-10-CM

## 2016-05-20 DIAGNOSIS — R6521 Severe sepsis with septic shock: Secondary | ICD-10-CM

## 2016-05-20 DIAGNOSIS — I340 Nonrheumatic mitral (valve) insufficiency: Secondary | ICD-10-CM

## 2016-05-20 DIAGNOSIS — G9340 Encephalopathy, unspecified: Secondary | ICD-10-CM

## 2016-05-20 LAB — CBC
HCT: 29.1 % — ABNORMAL LOW (ref 39.0–52.0)
HEMATOCRIT: 34.4 % — AB (ref 39.0–52.0)
Hemoglobin: 9.5 g/dL — ABNORMAL LOW (ref 13.0–17.0)
Hemoglobin: 11.4 g/dL — ABNORMAL LOW (ref 13.0–17.0)
MCH: 28.4 pg (ref 26.0–34.0)
MCH: 28.4 pg (ref 26.0–34.0)
MCHC: 32.6 g/dL (ref 30.0–36.0)
MCHC: 33.1 g/dL (ref 30.0–36.0)
MCV: 87.1 fL (ref 78.0–100.0)
MCV: 85.6 fL (ref 78.0–100.0)
PLATELETS: 104 10*3/uL — AB (ref 150–400)
PLATELETS: 122 10*3/uL — AB (ref 150–400)
RBC: 3.34 MIL/uL — AB (ref 4.22–5.81)
RBC: 4.02 MIL/uL — ABNORMAL LOW (ref 4.22–5.81)
RDW: 15.7 % — AB (ref 11.5–15.5)
RDW: 15.7 % — AB (ref 11.5–15.5)
WBC: 6.8 10*3/uL (ref 4.0–10.5)
WBC: 10.8 10*3/uL — AB (ref 4.0–10.5)

## 2016-05-20 LAB — COMPREHENSIVE METABOLIC PANEL
ALK PHOS: 89 U/L (ref 38–126)
ALT: 328 U/L — AB (ref 17–63)
AST: 128 U/L — AB (ref 15–41)
Albumin: 2.6 g/dL — ABNORMAL LOW (ref 3.5–5.0)
Anion gap: 8 (ref 5–15)
BILIRUBIN TOTAL: 0.3 mg/dL (ref 0.3–1.2)
BUN: 19 mg/dL (ref 6–20)
CALCIUM: 8.4 mg/dL — AB (ref 8.9–10.3)
CHLORIDE: 114 mmol/L — AB (ref 101–111)
CO2: 21 mmol/L — ABNORMAL LOW (ref 22–32)
CREATININE: 1.25 mg/dL — AB (ref 0.61–1.24)
Glucose, Bld: 75 mg/dL (ref 65–99)
Potassium: 4.4 mmol/L (ref 3.5–5.1)
Sodium: 143 mmol/L (ref 135–145)
TOTAL PROTEIN: 5.6 g/dL — AB (ref 6.5–8.1)

## 2016-05-20 LAB — ECHOCARDIOGRAM COMPLETE
FS: 32 % (ref 28–44)
Height: 68 in
IVS/LV PW RATIO, ED: 1.06
LA ID, A-P, ES: 34 mm
LA diam end sys: 34 mm
LA diam index: 2.07 cm/m2
LA vol A4C: 34.2 ml
LV PW d: 7.72 mm — AB (ref 0.6–1.1)
LVOT area: 3.14 cm2
LVOT diameter: 20 mm
Lateral S' vel: 11.5 cm/s
TAPSE: 27.3 mm
Weight: 2003.54 oz

## 2016-05-20 LAB — BLOOD CULTURE ID PANEL (REFLEXED)
Acinetobacter baumannii: NOT DETECTED
CANDIDA GLABRATA: NOT DETECTED
CANDIDA KRUSEI: NOT DETECTED
CANDIDA PARAPSILOSIS: NOT DETECTED
CARBAPENEM RESISTANCE: NOT DETECTED
Candida albicans: NOT DETECTED
Candida tropicalis: NOT DETECTED
ESCHERICHIA COLI: NOT DETECTED
Enterobacter cloacae complex: NOT DETECTED
Enterobacteriaceae species: NOT DETECTED
Enterococcus species: NOT DETECTED
Haemophilus influenzae: NOT DETECTED
KLEBSIELLA OXYTOCA: NOT DETECTED
KLEBSIELLA PNEUMONIAE: NOT DETECTED
LISTERIA MONOCYTOGENES: NOT DETECTED
Methicillin resistance: NOT DETECTED
NEISSERIA MENINGITIDIS: NOT DETECTED
Proteus species: NOT DETECTED
Pseudomonas aeruginosa: NOT DETECTED
SERRATIA MARCESCENS: NOT DETECTED
STAPHYLOCOCCUS SPECIES: NOT DETECTED
STREPTOCOCCUS AGALACTIAE: NOT DETECTED
STREPTOCOCCUS SPECIES: DETECTED — AB
Staphylococcus aureus (BCID): NOT DETECTED
Streptococcus pneumoniae: NOT DETECTED
Streptococcus pyogenes: NOT DETECTED
Vancomycin resistance: NOT DETECTED

## 2016-05-20 LAB — PHOSPHORUS
PHOSPHORUS: 4.4 mg/dL (ref 2.5–4.6)
PHOSPHORUS: 4.3 mg/dL (ref 2.5–4.6)
Phosphorus: 5.3 mg/dL — ABNORMAL HIGH (ref 2.5–4.6)

## 2016-05-20 LAB — BASIC METABOLIC PANEL
Anion gap: 8 (ref 5–15)
BUN: 21 mg/dL — AB (ref 6–20)
CALCIUM: 8.3 mg/dL — AB (ref 8.9–10.3)
CO2: 21 mmol/L — ABNORMAL LOW (ref 22–32)
CREATININE: 0.9 mg/dL (ref 0.61–1.24)
Chloride: 112 mmol/L — ABNORMAL HIGH (ref 101–111)
GFR calc Af Amer: >60 mL/min (ref >60)
Glucose, Bld: 68 mg/dL (ref 65–99)
Potassium: 4.9 mmol/L (ref 3.5–5.1)
SODIUM: 141 mmol/L (ref 135–145)

## 2016-05-20 LAB — STREP PNEUMONIAE URINARY ANTIGEN: STREP PNEUMO URINARY ANTIGEN: NEGATIVE

## 2016-05-20 LAB — TROPONIN I

## 2016-05-20 LAB — I-STAT ARTERIAL BLOOD GAS, ED
Acid-base deficit: 5 mmol/L — ABNORMAL HIGH (ref 0.0–2.0)
BICARBONATE: 19 meq/L — AB (ref 20.0–24.0)
O2 SAT: 91 %
PCO2 ART: 31.9 mmHg — AB (ref 35.0–45.0)
PO2 ART: 64 mmHg — AB (ref 80.0–100.0)
Patient temperature: 100.2
TCO2: 20 mmol/L (ref 0–100)
pH, Arterial: 7.387 (ref 7.350–7.450)

## 2016-05-20 LAB — MAGNESIUM
MAGNESIUM: 1.4 mg/dL — AB (ref 1.7–2.4)
MAGNESIUM: 2.2 mg/dL (ref 1.7–2.4)
Magnesium: 1.4 mg/dL — ABNORMAL LOW (ref 1.7–2.4)

## 2016-05-20 LAB — GLUCOSE, CAPILLARY
GLUCOSE-CAPILLARY: 100 mg/dL — AB (ref 65–99)
GLUCOSE-CAPILLARY: 93 mg/dL (ref 65–99)
GLUCOSE-CAPILLARY: 103 mg/dL — AB (ref 65–99)
GLUCOSE-CAPILLARY: 115 mg/dL — AB (ref 65–99)

## 2016-05-20 LAB — CBG MONITORING, ED: GLUCOSE-CAPILLARY: 82 mg/dL (ref 65–99)

## 2016-05-20 LAB — PROCALCITONIN: PROCALCITONIN: 1 ng/mL

## 2016-05-20 LAB — LACTIC ACID, PLASMA: Lactic Acid, Venous: 1.1 mmol/L (ref 0.5–2.0)

## 2016-05-20 MED ORDER — VITAL HIGH PROTEIN PO LIQD: 1000.0000 mL | ORAL | Status: DC

## 2016-05-20 MED ORDER — FAMOTIDINE IN NACL 20-0.9 MG/50ML-% IV SOLN
20.0000 mg | INTRAVENOUS | Status: DC
  Administered 2016-05-20 – 2016-05-24 (×5): 20 mg via INTRAVENOUS
  Filled 2016-05-20 (×6): qty 50

## 2016-05-20 MED ORDER — JEVITY 1.2 CAL PO LIQD
1000.0000 mL | ORAL | Status: DC
  Administered 2016-05-20 – 2016-05-21 (×2): 1000 mL
  Administered 2016-05-21: 65 mL
  Administered 2016-05-22 – 2016-05-26 (×7): 1000 mL
  Administered 2016-05-27: 65 mL/h
  Filled 2016-05-20 (×16): qty 1000

## 2016-05-20 MED ORDER — DEXTROSE 5 % IV SOLN
2.0000 g | Freq: Two times a day (BID) | INTRAVENOUS | Status: DC
  Administered 2016-05-20 – 2016-05-23 (×7): 2 g via INTRAVENOUS
  Filled 2016-05-20 (×8): qty 2

## 2016-05-20 MED ORDER — SODIUM CHLORIDE 0.9 % IV SOLN
1.0000 mg | Freq: Once | INTRAVENOUS | Status: AC
  Administered 2016-05-20: 1 mg via INTRAVENOUS
  Filled 2016-05-20: qty 0.2

## 2016-05-20 MED ORDER — PRO-STAT SUGAR FREE PO LIQD: 30.0000 mL | Freq: Two times a day (BID) | ORAL | Status: DC

## 2016-05-20 MED ORDER — SODIUM CHLORIDE 0.9 % IV BOLUS (SEPSIS)
500.0000 mL | Freq: Once | INTRAVENOUS | Status: AC
  Administered 2016-05-20: 500 mL via INTRAVENOUS

## 2016-05-20 MED ORDER — MAGNESIUM SULFATE 2 GM/50ML IV SOLN
2.0000 g | Freq: Once | INTRAVENOUS | Status: AC
  Administered 2016-05-20: 2 g via INTRAVENOUS
  Filled 2016-05-20: qty 50

## 2016-05-20 MED ORDER — SODIUM CHLORIDE 0.9 % IV SOLN: 250.0000 mL | INTRAVENOUS | Status: DC | PRN

## 2016-05-20 MED ORDER — ACETAMINOPHEN 325 MG PO TABS
650.0000 mg | ORAL_TABLET | Freq: Four times a day (QID) | ORAL | Status: DC | PRN
  Administered 2016-05-25 – 2016-05-29 (×2): 650 mg
  Filled 2016-05-20 (×2): qty 2

## 2016-05-20 MED ORDER — SODIUM CHLORIDE 0.9 % IV SOLN
1000.0000 mg | Freq: Two times a day (BID) | INTRAVENOUS | Status: DC
  Administered 2016-05-20 – 2016-05-23 (×8): 1000 mg via INTRAVENOUS
  Filled 2016-05-20 (×11): qty 10

## 2016-05-20 MED ORDER — PRO-STAT SUGAR FREE PO LIQD
30.0000 mL | Freq: Every day | ORAL | Status: DC
  Administered 2016-05-21 – 2016-05-27 (×7): 30 mL
  Filled 2016-05-20 (×8): qty 30

## 2016-05-20 MED ORDER — DEXTROSE-NACL 5-0.45 % IV SOLN
INTRAVENOUS | Status: DC
  Administered 2016-05-20 (×2): via INTRAVENOUS

## 2016-05-20 MED ORDER — INSULIN ASPART 100 UNIT/ML ~~LOC~~ SOLN
0.0000 [IU] | SUBCUTANEOUS | Status: DC
  Administered 2016-05-21: 1 [IU] via SUBCUTANEOUS

## 2016-05-20 MED ORDER — IPRATROPIUM-ALBUTEROL 0.5-2.5 (3) MG/3ML IN SOLN
3.0000 mL | Freq: Four times a day (QID) | RESPIRATORY_TRACT | Status: DC
  Administered 2016-05-20 – 2016-05-29 (×38): 3 mL via RESPIRATORY_TRACT
  Filled 2016-05-20 (×40): qty 3

## 2016-05-20 MED ORDER — PHENYLEPHRINE HCL 10 MG/ML IJ SOLN
0.0000 ug/min | INTRAVENOUS | Status: DC
  Administered 2016-05-20: 45 ug/min via INTRAVENOUS
  Administered 2016-05-20: 50 ug/min via INTRAVENOUS
  Administered 2016-05-20: 60 ug/min via INTRAVENOUS
  Administered 2016-05-20: 20 ug/min via INTRAVENOUS
  Filled 2016-05-20 (×7): qty 1

## 2016-05-20 MED ORDER — HEPARIN SODIUM (PORCINE) 5000 UNIT/ML IJ SOLN
5000.0000 [IU] | Freq: Three times a day (TID) | INTRAMUSCULAR | Status: DC
  Administered 2016-05-20 – 2016-05-24 (×15): 5000 [IU] via SUBCUTANEOUS
  Filled 2016-05-20 (×15): qty 1

## 2016-05-20 MED ORDER — THIAMINE HCL 100 MG/ML IJ SOLN
100.0000 mg | Freq: Every day | INTRAMUSCULAR | Status: DC
  Administered 2016-05-20 – 2016-05-23 (×4): 100 mg via INTRAVENOUS
  Filled 2016-05-20: qty 1
  Filled 2016-05-20 (×3): qty 2

## 2016-05-20 MED ORDER — VANCOMYCIN HCL IN DEXTROSE 750-5 MG/150ML-% IV SOLN
750.0000 mg | Freq: Two times a day (BID) | INTRAVENOUS | Status: DC
  Administered 2016-05-20 – 2016-05-22 (×5): 750 mg via INTRAVENOUS
  Filled 2016-05-20 (×7): qty 150

## 2016-05-20 MED ORDER — LORAZEPAM 2 MG/ML IJ SOLN: 1.0000 mg | INTRAMUSCULAR | Status: DC | PRN

## 2016-05-20 MED ORDER — ENOXAPARIN SODIUM 40 MG/0.4ML ~~LOC~~ SOLN: 40.0000 mg | SUBCUTANEOUS | Status: DC

## 2016-05-20 NOTE — Clinical Social Work Note (Signed)
Clinical Social Work Assessment  Patient Details  Name: Dale [name] MRN: [id number] Date of Birth: 08/27/1966  Date of referral:  05/20/16               Reason for consult:  Facility Placement                Permission sought to share information with:  Family Supports Permission granted to share information::  Yes, Verbal Permission Granted  Name::     Dale SkinnerShirley [name]  Relationship::  Spouse  Contact Information:  [phone]  Housing/Transportation Living arrangements for the past 2 months:  Skilled Nursing Facility Source of Information:  Spouse Patient Interpreter Needed:  None Criminal Activity/Legal Involvement Pertinent to Current Situation/Hospitalization:  No - Comment as needed Significant Relationships:  Adult Children, Spouse Lives with:  Facility Resident Do you feel safe going back to the place where you live?  Yes Need for family participation in patient care:  Yes (Comment)  Care giving concerns:  Patient spouse states that patient has been at [facility] since the end of May and has plans for him to return pending bed availability.   Social Worker assessment / plan:  Visual merchandiserClinical Social Worker spoke with patient spouse over the phone to offer support and discuss patient plans at discharge.  Patient spouse states that patient plan is to return to [facility] once medically stable.  CSW contacted facility to confirm patient return, however admissions coordinator not available at this time.  CSW completed FL2 and will facilitate patient discharge needs once medically stable.  Employment status:  Disabled (Comment on whether or not currently receiving Disability) Insurance information:  [place] Recommendations:  Not assessed at this time, Skilled Nursing Facility Information / Referral to community resources:  Skilled Nursing Facility  Patient/Family's Response to care:  Patient family understanding of patient continued need for SNF placement and agreeable to  return to [facility].  Patient/Family's Understanding of and Emotional Response to Diagnosis, Current Treatment, and Prognosis:  Patient family understanding of patient continued barriers and inability to care for him at home.    Emotional Assessment Appearance:  Appears older than stated age Attitude/Demeanor/Rapport:  Unable to Assess Affect (typically observed):  Unable to Assess Orientation:  Oriented to Self Alcohol / Substance use:  Not Applicable Psych involvement (Current and /or in the community):  No (Comment)  Discharge Needs  Concerns to be addressed:  Discharge Planning Concerns Readmission within the last 30 days:  Yes Current discharge risk:  Dependent with Mobility Barriers to Discharge:  Continued Medical Work up   MetLifeJesse Dale [name], KentuckyLCSW [phone]

## 2016-05-20 NOTE — Consult Note (Addendum)
Neurology Consultation Reason for Consult: Altered mental status, seizures Referring Physician: Dr. Celene Skeen     HPI: Dale [name] is a 50 y.o. male  is transferred from skilled nursing facility due to worsening mental status. March 2017 patient was diagnosed with pneumococcal meningitis, hospital course was complicated with cardiac arrest resulting in hypoxic cerebral injury involving bilateral basal ganglia and left coronary radiata. Patient was in skilled nursing facility, new baseline is described as aphasic, left eye deviation but able to walk with support. He was found to have worsening mental status more than his baseline. Currently he has been diagnosed with urinary tract infection with sepsis. Neurology was consulted for altered mental status, questionable seizures, scapular nursing facility he is supposed to be on Keppra 500 mg twice a day  In the emergency room he has received 500 mg of Depakote and 1000 milligrams of Keppra   PAST MEDICAL HISTORY :  Past Medical History  Diagnosis Date  . Depression   . Cardiac arrest (HCC)   . Cardiogenic shock (HCC)   . Acute respiratory failure with hypoxia (HCC)   . Meningitis, streptococcal 02/16/2016  . 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 Tobacco abuse 02/16/2016  . Substance abuse   . ETOH abuse   . Septic shock (HCC)   . Urinary retention 05/04/2016    Urinary retention - Continue Flomax   . Systolic and diastolic CHF, chronic (HCC)   . 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 Severe sepsis (HCC)   . Respiratory failure (HCC) 03/03/2016  . Protein-calorie malnutrition, severe (HCC)   . NSTEMI (non-ST elevated myocardial infarction) (HCC) 03/14/2016  . Meningoencephalitis   . Meningitis, pneumococcal, recurrent   . Low serum cortisol level (HCC) 05/04/2016    Low a.m. cortisol  - Was 1.2 checked on 04/03/2016 - This likely is reflective of cortisol  suppression from steroid use - Tapered steroids off   . Hypothermia 03/20/2016  . Hypernatremia   . Hyperlipidemia 05/04/2016    Dyslipidemia - Continue statin.   Marland Kitchen Hydrocephalus   . Dysphagia 05/04/2016    Dysphagia - Dysphagia 1 diet per SLP   . Dementia 04/27/2016  . Convulsions/seizures (HCC) 05/04/2016    Seizure disorder - Continue Keppra, Depakote.   . CHF (congestive heart failure) (HCC)   . Cerebral thrombosis with cerebral infarction 03/25/2016  . Cerebral septic emboli (HCC)   . Altered mental status   . AKI (acute kidney injury) (HCC) 02/16/2016  . Acute renal failure (HCC)   . Acute ischemic stroke (HCC)   . Acute encephalopathy     PAST SURGICAL HISTORY: Past Surgical History  Procedure Laterality Date  . Ankle surgery    . Cardiac catheterization N/A 02/17/2016    Procedure: IABP Insertion; Surgeon: Iran Ouch, MD; Location: [facility] INVASIVE CV LAB; Service: Cardiovascular; Laterality: N/A;  . Tee without cardioversion N/A 03/29/2016    Procedure: TRANSESOPHAGEAL ECHOCARDIOGRAM (TEE); Surgeon: Laurey Morale, MD; Location: [facility] ENDOSCOPY; Service: Cardiovascular; Laterality: N/A;    No Known Allergies  No current facility-administered medications on file prior to encounter.   Current Outpatient Prescriptions on File Prior to Encounter  Medication Sig  . albuterol (PROVENTIL) (2.5 MG/3ML) 0.083% nebulizer solution Take 3 mLs (2.5 mg total) by nebulization every 3 (three) hours as needed for wheezing. (Patient taking differently: Take 2.5 mg by nebulization every 3 (three) hours as needed for wheezing or shortness of breath. )  . aspirin 325 MG tablet Place 1 tablet (325 mg total) into feeding  tube daily. (Patient taking differently: Take 325 mg by mouth daily. )  . atorvastatin (LIPITOR) 20 MG tablet Take 1 tablet (20 mg total) by mouth daily at 6 PM.  .  carvedilol (COREG) 3.125 MG tablet Place 1 tablet (3.125 mg total) into feeding tube 2 (two) times daily with a meal. (Patient taking differently: Take 3.125 mg by mouth 2 (two) times daily with a meal. )  . famotidine (PEPCID) 40 MG/5ML suspension Place 2.5 mLs (20 mg total) into feeding tube daily. (Patient taking differently: Take 20 mg by mouth daily. )  . feeding supplement, ENSURE ENLIVE, (ENSURE ENLIVE) LIQD Take 237 mLs by mouth every 6 (six) hours.  . folic acid (FOLVITE) 1 MG tablet Place 1 tablet (1 mg total) into feeding tube daily. (Patient taking differently: Take 1 mg by mouth daily. )  . levETIRAcetam (KEPPRA) 100 MG/ML solution Place 5 mLs (500 mg total) into feeding tube 2 (two) times daily. (Patient taking differently: Take 500 mg by mouth 2 (two) times daily. )  . tamsulosin (FLOMAX) 0.4 MG CAPS capsule Take 1 capsule (0.4 mg total) by mouth daily.  Marland Kitchen. thiamine 100 MG tablet Place 1 tablet (100 mg total) into feeding tube daily. (Patient taking differently: Take 100 mg by mouth daily. )  . valproic acid (DEPAKENE) 250 MG/5ML syrup Place 5 mLs (250 mg total) into feeding tube every 8 (eight) hours. (Patient taking differently: Take 500 mg by mouth 2 (two) times daily. )    FAMILY HISTORY:  Unable to obtain given altered mentation.   SOCIAL HISTORY: Social History   Social History  . Marital Status: Legally Separated    Spouse Name: N/A  . Number of Children: N/A  . Years of Education: N/A   Social History Main Topics  . Smoking status: Current Every Day Smoker -- 0.10 packs/day    Types: Cigarettes  . Smokeless tobacco: None  . Alcohol Use: No  . Drug Use: Yes    Special: Marijuana  . Sexual Activity: Not Asked   Other Topics Concern  . None   Social History Narrative   [facility]   FULL CODE    REVIEW OF SYSTEMS: Unable         Exam: Current vital  signs: BP 94/65 mmHg  Pulse 133  Temp(Src) 99.5 F (37.5 C) (Other (Comment))  Resp 21  SpO2 95% Vital signs in last 24 hours: Temp:  [90 F (32.2 C)-100.4 F (38 C)] 99.5 F (37.5 C) (06/16 0130) Pulse Rate:  [61-140] 133 (06/16 0130) Resp:  [12-25] 21 (06/16 0130) BP: (85-115)/(57-92) 94/65 mmHg (06/16 0130) SpO2:  [89 %-100 %] 95 % (06/16 0130)  Physical Exam  Constitutional: Lying in bed with left gaze preference does not follow any commands  Eyes: No scleral injection Head: Normocephalic.  Cardiovascular: Normal rate and regular rhythm.  Respiratory: Effort normal and breath sounds normal , Nasal trumpet Skin: WDI  Neuro: Awake, eyes open but non-verbal. Does not follow commands. Left gaze preference, barely cross midline on stimulation. Blinks to threat on the left but not the right. Pupils reactive. Right lower facial weakness. Tongue is midline. On pain stimulation, extension of left UE but withdraw of right UE, withdraw both legs to pain. DTR 1+, bilateral babinski positive.  I have reviewed labs in epic and the results pertinent to this consultation are: Pertinent lab findings, signs of urinary tract infection, elevated liver enzymes   Assessment and recommendation  Dale [name] is  a 50 y.o. male  is transferred from skilled nursing facility due to worsening mental status. March 2017 patient was diagnosed with pneumococcal meningitis, hospital course was complicated with cardiac arrest resulting in hypoxic cerebral injury involving bilateral basal ganglia and left coronary radiata. Patient was in skilled nursing facility, new baseline is described as aphasic, left eye deviation but able to walk with support. He was found to have worsening mental status more than his baseline. Diagnostic consideration includes metabolic encephalopathy associated with urinary tract infection versus seizures In the emergency room he has received 500 mg Depakote and thousand milligrams of  Keppra. Will discontinue his Depakote since patient has elevated liver enzymes. We will Continue Keppra thousand milligrams twice a day. Continue treatment for urinary tract infection and sepsis We will obtain an EEG for further management. Will continue to follow   Cy Blamer, MD 2:41 AM

## 2016-05-20 NOTE — Progress Notes (Signed)
Pharmacy Antibiotic Note  Dale [name] is a 50 y.o. male admitted on 05/19/2016 with likely meningitis. Pt with complicated medical history including strep pneumo meningitis March 2016 - treated with Rocephin.  Pharmacy has been consulted for Rocephin and Vancomycin dosing.  Rocephin 2gm IV given in ED ~1700 and Vanc 1gm IV given in ED ~1800  Plan: Rocephin 2gm IV q12h Vancomycin 750mg  IV q12h Will f/u micro data, renal function, and pt's clinical condition Vanc trough prn     Temp (24hrs), Avg:96 F (35.6 C), Min:90 F (32.2 C), Max:100.4 F (38 C)   Recent Labs Lab 05/19/16 1505 05/19/16 1525 05/19/16 2011 05/19/16 2115  WBC 8.8  --   --   --   CREATININE 0.97  --   --   --   LATICACIDVEN  --  0.87 1.90 1.4    Estimated Creatinine Clearance: 71.1 mL/min (by C-G formula based on Cr of 0.97).    No Known Allergies  Antimicrobials this admission: 6/15 Vanc >>  6/15 Rocephin >>   Dose adjustments this admission: n/a  Microbiology results: 6/15 BCx x2:  6/15 UCx:    Thank you for allowing pharmacy to be a part of this patient's care.  Christoper Fabianaron Terease Marcotte, PharmD, BCPS Clinical pharmacist, pager [phone] 05/20/2016 1:47 AM

## 2016-05-20 NOTE — Progress Notes (Signed)
EEG completed, results pending. 

## 2016-05-20 NOTE — Consult Note (Signed)
PULMONARY / CRITICAL CARE MEDICINE   Name: Dale [name] MRN: [id number] DOB: 06/10/1966    ADMISSION DATE:  05/19/2016 CONSULTATION DATE:  05/20/2016  REFERRING MD:  Dale [name], D.O. / TRH  CHIEF COMPLAINT:  Acute Hypoxic Respiratory Failure  HISTORY OF PRESENT ILLNESS:  History obtained from bedside physician & electronic medical record given altered mentation. Patient has had a complicated past medical course including admission with strep pneumonia meningitis and hospital course complicated by cardiac arrest and cardiogenic shock in March 2016. Patient has had subsequent readmissions until ultimately ended up in a skilled nursing facility. Patient was ultimately transitioned to Rocephin with plans for infectious diseases follow-up before discontinuing. Per electronic medical record on 6/15 the patient had decreased mentation with his baseline nonverbal but normally walking. Patient became nonambulatory. He ultimately had a seizure which prompted transfer to the emergency department. Reportedly they did not give the patient his seizure medications today. In the emergency department the patient was given a load of Depacon and 1 g of IV Keppra. He was noted to be hypothermic as well.  PAST MEDICAL HISTORY :  Past Medical History  Diagnosis Date  . Depression   . Cardiac arrest (HCC)   . Cardiogenic shock (HCC)   . Acute respiratory failure with hypoxia (HCC)   . Meningitis, streptococcal 02/16/2016  . 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 Tobacco abuse 02/16/2016  . Substance abuse   . ETOH abuse   . Septic shock (HCC)   . Urinary retention 05/04/2016    Urinary retention - Continue Flomax     . Systolic and diastolic CHF, chronic (HCC)   . 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 Severe sepsis (HCC)   . Respiratory failure (HCC) 03/03/2016  . Protein-calorie malnutrition, severe (HCC)   . NSTEMI (non-ST elevated myocardial infarction) (HCC) 03/14/2016  . Meningoencephalitis   .  Meningitis, pneumococcal, recurrent   . Low serum cortisol level (HCC) 05/04/2016    Low a.m. cortisol  - Was 1.2 checked on 04/03/2016  - This likely is reflective of cortisol suppression from steroid use - Tapered steroids off   . Hypothermia 03/20/2016  . Hypernatremia   . Hyperlipidemia 05/04/2016    Dyslipidemia - Continue statin.     Marland Kitchen. Hydrocephalus   . Dysphagia 05/04/2016    Dysphagia - Dysphagia 1 diet per SLP     . Dementia 04/27/2016  . Convulsions/seizures (HCC) 05/04/2016    Seizure disorder - Continue Keppra, Depakote.     . CHF (congestive heart failure) (HCC)   . Cerebral thrombosis with cerebral infarction 03/25/2016  . Cerebral septic emboli (HCC)   . Altered mental status   . AKI (acute kidney injury) (HCC) 02/16/2016  . Acute renal failure (HCC)   . Acute ischemic stroke (HCC)   . Acute encephalopathy     PAST SURGICAL HISTORY: Past Surgical History  Procedure Laterality Date  . Ankle surgery    . Cardiac catheterization N/A 02/17/2016    Procedure: IABP Insertion;  Surgeon: Dale OuchMuhammad A Arida, MD;  Location: [facility] INVASIVE CV [facility];  Service: Cardiovascular;  Laterality: N/A;  . Tee without cardioversion N/A 03/29/2016    Procedure: TRANSESOPHAGEAL ECHOCARDIOGRAM (TEE);  Surgeon: Laurey Moralealton S McLean, MD;  [facility];  Service: Cardiovascular;  Laterality: N/A;    No Known Allergies  No current facility-administered medications on file prior to encounter.   Current Outpatient Prescriptions on File Prior to Encounter  Medication Sig  . albuterol (PROVENTIL) (2.5 MG/3ML) 0.083% nebulizer solution  Take 3 mLs (2.5 mg total) by nebulization every 3 (three) hours as needed for wheezing. (Patient taking differently: Take 2.5 mg by nebulization every 3 (three) hours as needed for wheezing or shortness of breath. )  . aspirin 325 MG tablet Place 1 tablet (325 mg total) into feeding tube daily. (Patient taking differently: Take 325 mg by mouth daily. )  . atorvastatin  (LIPITOR) 20 MG tablet Take 1 tablet (20 mg total) by mouth daily at 6 PM.  . carvedilol (COREG) 3.125 MG tablet Place 1 tablet (3.125 mg total) into feeding tube 2 (two) times daily with a meal. (Patient taking differently: Take 3.125 mg by mouth 2 (two) times daily with a meal. )  . famotidine (PEPCID) 40 MG/5ML suspension Place 2.5 mLs (20 mg total) into feeding tube daily. (Patient taking differently: Take 20 mg by mouth daily. )  . feeding supplement, ENSURE ENLIVE, (ENSURE ENLIVE) LIQD Take 237 mLs by mouth every 6 (six) hours.  . folic acid (FOLVITE) 1 MG tablet Place 1 tablet (1 mg total) into feeding tube daily. (Patient taking differently: Take 1 mg by mouth daily. )  . levETIRAcetam (KEPPRA) 100 MG/ML solution Place 5 mLs (500 mg total) into feeding tube 2 (two) times daily. (Patient taking differently: Take 500 mg by mouth 2 (two) times daily. )  . tamsulosin (FLOMAX) 0.4 MG CAPS capsule Take 1 capsule (0.4 mg total) by mouth daily.  Marland Kitchen thiamine 100 MG tablet Place 1 tablet (100 mg total) into feeding tube daily. (Patient taking differently: Take 100 mg by mouth daily. )  . valproic acid (DEPAKENE) 250 MG/5ML syrup Place 5 mLs (250 mg total) into feeding tube every 8 (eight) hours. (Patient taking differently: Take 500 mg by mouth 2 (two) times daily. )    FAMILY HISTORY:  Unable to obtain given altered mentation.   SOCIAL HISTORY: Social History   Social History  . Marital Status: Legally Separated    Spouse Name: N/A  . Number of Children: N/A  . Years of Education: N/A   Social History Main Topics  . Smoking status: Current Every Day Smoker -- 0.10 packs/day    Types: Cigarettes  . Smokeless tobacco: None  . Alcohol Use: No  . Drug Use: Yes    Special: Marijuana  . Sexual Activity: Not Asked   Other Topics Concern  . None   Social History Narrative   Admitted to [facility] 05/04/16   FULL CODE    REVIEW OF SYSTEMS:  Unable to assess given acute encephalopathy and  baseline nonverbal status.  SUBJECTIVE: As above.  VITAL SIGNS: BP 92/58 mmHg  Pulse 138  Temp(Src) 100.2 F (37.9 C) (Other (Comment))  Resp 22  SpO2 89%  HEMODYNAMICS:    VENTILATOR SETTINGS:    INTAKE / OUTPUT:    PHYSICAL EXAMINATION: General:  No acute distress. No family at bedside.  Integument:  Warm & dry. No rash on exposed skin. RUE PICC in place. Lymphatics:  No appreciated cervical or supraclavicular lymphadenoapthy. HEENT:  Tacky mucus membranes. No scleral injection or icterus.  Cardiovascular:  Regular rate. No edema. No appreciable JVD.  Pulmonary:  Good aeration & clear to auscultation bilaterally. Normal work of breathing. Abdomen: Soft. Normal bowel sounds. Nondistended.  Musculoskeletal:  Symmetrically decreased muscle bulk. No joint deformity or effusion appreciated. Neurological:  Withdrawal to pain in all 4 extremities. Left gaze deviation that is chronic per prior records. Pupils symmetric. Psychiatric:  Unable to assess given altered mentation.  LABS:  BMET  Recent Labs Lab 05/19/16 1505  NA 140  K 5.3*  CL 107  CO2 26  BUN 24*  CREATININE 0.97  GLUCOSE 87    Electrolytes  Recent Labs Lab 05/19/16 1505  CALCIUM 9.8    CBC  Recent Labs Lab 05/19/16 1505  WBC 8.8  HGB 13.0  HCT 38.4*  PLT 133*    Coag's  Recent Labs Lab 05/19/16 2115  APTT 53*  INR 1.27    Sepsis Markers  Recent Labs Lab 05/19/16 1525 05/19/16 2011 05/19/16 2115  LATICACIDVEN 0.87 1.90 1.4  PROCALCITON  --   --  0.13    ABG No results for input(s): PHART, PCO2ART, PO2ART in the last 168 hours.  Liver Enzymes  Recent Labs Lab 05/19/16 1505  AST 228*  ALT 483*  ALKPHOS 105  BILITOT 0.4  ALBUMIN 3.3*    Cardiac Enzymes No results for input(s): TROPONINI, PROBNP in the last 168 hours.  Glucose  Recent Labs Lab 05/19/16 1848  GLUCAP 115*    Imaging Ct Head Wo Contrast  05/19/2016  CLINICAL DATA:  Seizure.  Recent  diagnosis of meningitis.  Dementia. EXAM: CT HEAD WITHOUT CONTRAST TECHNIQUE: Contiguous axial images were obtained from the base of the skull through the vertex without intravenous contrast. COMPARISON:  03/22/2016 FINDINGS: Brain: Mild diffuse atrophy as before. No evidence of acute infarction, hemorrhage, extra-axial collection, ventriculomegaly, or mass effect. Vascular: No hyperdense vessel or unexpected calcification. Skull: Negative for fracture or focal lesion. Sinuses/Orbits: No acute findings. Other: None. IMPRESSION: 1. Mild atrophy. Negative for bleed or other acute intracranial process. Electronically Signed   By: Corlis Leak M.D.   On: 05/19/2016 15:56   US Abdomen Limited  05/19/2016  CLINICAL DATA:  Abnormal liver function tests. EXAM: US ABDOMEN LIMITED - RIGHT UPPER QUADRANT COMPARISON:  11/13/2011 CT scan FINDINGS: Gallbladder: Multiple tiny gallstones are present measuring up to 3 mm diameter. These cause mild shadowing. There also appears to be tumefactive sludge in the gallbladder especially along the fundus, surrounding a small gallstone. This is mobile and does not demonstrate internal blood flow to suggest a polyp. There could be smaller polyps along the gallbladder wall given the specular tiny echogenicities along the gallbladder wall. Common bile duct: Diameter: 3 mm Liver: No focal lesion identified. Within normal limits in parenchymal echogenicity. IMPRESSION: 1. Multiple small gallstones in the gallbladder. There is some tumefactive sludge surrounding one of the gallstones, mobile within the gallbladder. The other gallstones are in the 3 mm size range. There may be some tiny polyps along the gallbladder wall but none measuring more than 3 mm in diameter. Hyperplastic cholecystosis with superimposed sludge and tiny gallstones not excluded. No overt gallbladder wall thickening. Sonographic Murphy's sign absent. Electronically Signed   By: Gaylyn Rong M.D.   On: 05/19/2016 18:57    Dg Chest Port 1 View  05/19/2016  CLINICAL DATA:  Hypothermia EXAM: PORTABLE CHEST 1 VIEW COMPARISON:  03/20/2016 FINDINGS: The patient is rotated to the left on today's radiograph, reducing diagnostic sensitivity and specificity. Right PICC line tip: SVC. The lungs appear clear. Cardiac and mediastinal contours normal. No pleural effusion identified. IMPRESSION: 1. No acute findings. 2. Right PICC line tip:  SVC. Electronically Signed   By: Gaylyn Rong M.D.   On: 05/19/2016 15:04     STUDIES:  PORT CXR 6/15: Chronic elevation of left hemidiaphragm. No focal opacity or effusion appreciated. CT HEAD W/O 6/15: Mild atrophy. No evidence of acute  infarction, hemorrhage, or mass effect. [place] ABD LIMITED 6/15: Multiple small gallstones within the gallbladder. Some tumefactive sludge surrounding one of gallstones. Other gallstones are 3 mm range in size. Some tiny polyps along the gallbladder wall but none more than 3 mm. No overt gallbladder wall thickening. Absent sonographic Murphy sign. PORT CXR 6/16:  No focal opacification but hazy densities and lower lung zones consistent with atelectasis.  MICROBIOLOGY: Blood Ctx x2 6/15 >> Urine Ctx 6/15 >> MRSA PCR 6/16 >>  ANTIBIOTICS: Vancomycin 6/15 >> Rocephin 6/15 >>  SIGNIFICANT EVENTS: 3/14 - 4/06 - Admit w/ Strep pneumo meningitis, cardiogenic shock & cardiac arrest. ARDS s/p trach & removal. 4/16 - 5/01 - Readmit after fall w/ AMS.  5/24 - 5/26 - Readmitted for SNF placement 6/15 - Admit from SNF w/ decreased mentation 6/16 - Consult for hypoxia  LINES/TUBES: FOLEY 6/15 >> R DL PICC 6/43 >>  DISCUSSION:  50 year old male with history of Streptococcus pneumoniae meningitis as well as indwelling PICC line. Presents today with decreased level of consciousness/activity and witnessed seizure. Hypothermic on arrival. Suspect underlying sepsis with urinary tract infection as likely source. Continuing Keppra IV and consult neurology.  Awaiting cultures. Continuing empiric vancomycin & Rocephin with pharmacy to dose. Attempted to contact multiple family members today to discuss CODE STATUS but was unsuccessful.  ASSESSMENT / PLAN:  PULMONARY A: Acute Hypoxic Respiratory Failure - Resolved with NT suctioning.  P:   Continuous Pulse Ox. Supplemental oxygen to maintain Sat >94%.  NEUROLOGIC A:   Witnessed Seizure - Hasn't received antiepileptic medications. H/O Strep pneumo Meningitis H/O Anoxic Brain Injury - Baseline nonverbal status.  P:   RASS goal: 0 Neurology Consult Keppra IV bid Seizure Precautions Ativan IV prn Neuro Checks q1hr x4 hours then q2hr Defer EEG to Neurology  CARDIOVASCULAR A:  Shock - Likely secondary to sepsis. Tachycardia - Sinus on telemetry.  P:  Monitor on telemetry Vitals per unit protocol Phenylephrine to maintain MAP >65 & SBP >90 Trending Troponin I q6hr Checking TTE  RENAL A:   Hyperkalemia - Mild. Normal renal function.  P:   Trending UOP with Foley Repeat electrolytes & renal function w/ AM labs Replace electrolytes as indicated  GASTROINTESTINAL A:   Transaminitis - Neg RUQ U/S.  P:   NPO Repeat LFTs with AM labs Pepcid IV daily  HEMATOLOGIC A:   Thrombocytopenia  P:  Trending cell counts daily w/ CBC SCDs Heparin Peterman q8hr  INFECTIOUS A:   Sepsis  H/O Strep pneumo Meningitis UTI  P:   Vancomycin & Rocephin Day #2 Awaiting blood & urine culture results Droplet isolation PCT per algorithm Checking Urine Strep Antigen  ENDOCRINE A:   At risk for hypoglycemia.  P:   Accu-Checks q4hr   FAMILY  - Updates: No family at bedside. Attempted to contact all numbers and contacts in the EMR but no answer. Messages left to call hospital & speak with "ICU Physician".  - Inter-disciplinary family meet or Palliative Care meeting due by:  6/23  I have spent a total of 41 minutes of critical care time today caring for the patient, attempted to  contact family members, and reviewing the patient's electronic medical record.  Dale [name], M.D. [facility] Pulmonary & Critical Care Pager:  [phone] After 3pm or if no response, call [phone] 05/20/2016, 12:50 AM

## 2016-05-20 NOTE — NC FL2 (Signed)
[place] MEDICAID FL2 LEVEL OF CARE SCREENING TOOL     IDENTIFICATION  Patient Name: Dale [name] Birthdate: 1966/10/22 Sex: male Admission Date (Current Location): 05/19/2016  [facility] and [place] Number:  Producer, television/film/video and Address:  [facility], [address] [place], [place] [address]      Provider Number: [id number]  Attending Physician Name and Address:  Roslynn Amble, MD  Relative Name and Phone Number:       Current Level of Care: Hospital Recommended Level of Care: Skilled Nursing Facility Prior Approval Number:    Date Approved/Denied:   PASRR Number: [id number]  Discharge Plan: SNF    Current Diagnoses: Patient Active Problem List   Diagnosis Date Noted  . Hypothermia 05/19/2016  . Seizure (HCC)   . Weak 05/06/2016  . Convulsions/seizures (HCC) 05/04/2016  . Urinary retention 05/04/2016  . Hyperlipidemia 05/04/2016  . Dysphagia 05/04/2016  . Low serum cortisol level (HCC) 05/04/2016  . Dementia 04/27/2016  . 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 Protein-calorie malnutrition, severe (HCC)   . Palliative care encounter   . Cerebral septic emboli (HCC)   . Sepsis (HCC)   . Cerebral thrombosis with cerebral infarction 03/25/2016  . Meningoencephalitis   . Bacterial meningitis   . Acute ischemic stroke (HCC)   . Severe sepsis (HCC)   . Meningitis, pneumococcal, recurrent   . Systolic and diastolic CHF, chronic (HCC)   . Acute renal failure (HCC)   . Hypernatremia   . NSTEMI (non-ST elevated myocardial infarction) (HCC) 03/14/2016  . Physical deconditioning 03/14/2016  . Tracheostomy status (HCC)   . Encounter for palliative care   . Tracheostomy care (HCC)   . Respiratory failure (HCC) 03/03/2016  . Acute respiratory failure with hypoxia (HCC)   . Septic shock (HCC)   . Cardiogenic shock (HCC)   . Cardiac arrest (HCC)   . Hydrocephalus   . Altered mental status   . CHF (congestive heart failure) (HCC)   .  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 Homeless 02/16/2016  . Meningitis, streptococcal 02/16/2016  . Tobacco abuse 02/16/2016  . Gram positive sepsis (HCC) 02/16/2016  . Underweight 02/16/2016  . ETOH abuse   . Substance abuse   . Acute encephalopathy   . Acute respiratory failure (HCC)     Orientation RESPIRATION BLADDER Height & Weight     Self  Normal Indwelling catheter Weight: 125 lb 3.5 oz (56.8 kg) Height:   (172.7 cm)  BEHAVIORAL SYMPTOMS/MOOD NEUROLOGICAL BOWEL NUTRITION STATUS      Incontinent    AMBULATORY STATUS COMMUNICATION OF NEEDS Skin   Extensive Assist Non-Verbally Normal                       Personal Care Assistance Level of Assistance  Bathing, Feeding, Dressing Bathing Assistance: Maximum assistance Feeding assistance: Maximum assistance Dressing Assistance: Maximum assistance     Functional Limitations Info  Sight, Hearing, Speech Sight Info: Adequate Hearing Info: Adequate Speech Info: Impaired    SPECIAL CARE FACTORS FREQUENCY  PT (By licensed PT), OT (By licensed OT)     PT Frequency: 3 OT Frequency: 3     Speech Therapy Frequency: 3      Contractures Contractures Info: Present    Additional Factors Info  Code Status, Allergies, Insulin Sliding Scale, Isolation Precautions Code Status Info: Full Code Allergies Info: No Known Allergies   Insulin Sliding Scale Info: Every 4 hours Novolog Isolation Precautions  Info: MRSA by pcr 03/20/16     Current Medications (05/20/2016):  This is the current hospital active medication list Current Facility-Administered Medications  Medication Dose Route Frequency Provider Last Rate Last Dose  . 0.9 %  sodium chloride infusion  250 mL Intravenous PRN Roslynn AmbleJennings E Nestor, MD      . acetaminophen (TYLENOL) tablet 650 mg  650 mg Per Tube Q6H PRN Hillary BowJared M Gardner, DO      . albuterol (PROVENTIL) (2.5 MG/3ML) 0.083% nebulizer solution 2.5 mg  2.5 mg Nebulization Q3H PRN Hillary BowJared M Gardner, DO       . cefTRIAXone (ROCEPHIN) 2 g in dextrose 5 % 50 mL IVPB  2 g Intravenous Q12H Titus MouldCaron G Amend, RPH 100 mL/hr at 05/20/16 0552 2 g at 05/20/16 0552  . dextrose 5 %-0.45 % sodium chloride infusion   Intravenous Continuous Jeanella CrazeBrandi L Ollis, NP 125 mL/hr at 05/20/16 1210    . famotidine (PEPCID) IVPB 20 mg premix  20 mg Intravenous Q24H Roslynn AmbleJennings E Nestor, MD   20 mg at 05/20/16 0809  . feeding supplement (JEVITY 1.2 CAL) liquid 1,000 mL  1,000 mL Per Tube Continuous Oretha Milchakesh V Alva, MD 65 mL/hr at 05/20/16 1500 1,000 mL at 05/20/16 1500  . [START ON 05/21/2016] feeding supplement (PRO-STAT SUGAR FREE 64) liquid 30 mL  30 mL Per Tube Daily Oretha Milchakesh V Alva, MD      . heparin injection 5,000 Units  5,000 Units Subcutaneous Q8H Roslynn AmbleJennings E Nestor, MD   5,000 Units at 05/20/16 1439  . insulin aspart (novoLOG) injection 0-9 Units  0-9 Units Subcutaneous Q4H Roslynn AmbleJennings E Nestor, MD   0 Units at 05/20/16 0348  . ipratropium-albuterol (DUONEB) 0.5-2.5 (3) MG/3ML nebulizer solution 3 mL  3 mL Nebulization QID Jose Alexis FrockAngelo A de Dios, MD   3 mL at 05/20/16 1253  . levETIRAcetam (KEPPRA) 1,000 mg in sodium chloride 0.9 % 100 mL IVPB  1,000 mg Intravenous Q12H Zulfiqar Willodean RosenthalAli Turk, MD   1,000 mg at 05/20/16 0954  . LORazepam (ATIVAN) injection 1-2 mg  1-2 mg Intravenous Q30 min PRN Roslynn AmbleJennings E Nestor, MD      . phenylephrine (NEO-SYNEPHRINE) 10 mg in dextrose 5 % 250 mL (0.04 mg/mL) infusion  0-400 mcg/min Intravenous Titrated Roslynn AmbleJennings E Nestor, MD 60 mL/hr at 05/20/16 1440 40 mcg/min at 05/20/16 1440  . thiamine (B-1) injection 100 mg  100 mg Intravenous Daily Roslynn AmbleJennings E Nestor, MD   100 mg at 05/20/16 0954  . vancomycin (VANCOCIN) IVPB 750 mg/150 ml premix  750 mg Intravenous Q12H Titus Mouldaron G Amend, [facility] WayRPH         Discharge Medications: Please see discharge summary for a list of discharge medications.  Relevant Imaging Results:  Relevant Lab Results:   Additional Information [id number]  Macario GoldsJesse Duha Abair, [place] [phone]

## 2016-05-20 NOTE — Progress Notes (Signed)
PULMONARY / CRITICAL CARE MEDICINE   Name: Dale [name] MRN: [id number] DOB: 11/29/1966    ADMISSION DATE:  05/19/2016 CONSULTATION DATE:  05/20/2016  REFERRING MD:  Lyda PeroneJared Gardner, D.O. / TRH  CHIEF COMPLAINT:  Acute Hypoxic Respiratory Failure  HISTORY OF PRESENT ILLNESS:  History obtained from bedside physician & electronic medical record given altered mentation. Patient has had a complicated past medical course including admission with strep pneumonia meningitis and hospital course complicated by cardiac arrest and cardiogenic shock in March 2016. Patient has had subsequent readmissions until ultimately ended up in a skilled nursing facility. Patient was ultimately transitioned to Rocephin with plans for infectious diseases follow-up before discontinuing. Per electronic medical record on 6/15 the patient had decreased mentation with his baseline nonverbal but normally walking. Patient became nonambulatory. He ultimately had a seizure which prompted transfer to the emergency department. Reportedly they did not give the patient his seizure medications today. In the emergency department the patient was given a load of Depacon and 1 g of IV Keppra. He was noted to be hypothermic as well.    SUBJECTIVE:  O2 stable on room air with nasal trumpet. Remains on Neo 40 mcg/min and hypotensive. Decreased urine output and worsening renal function. +4044 L  Pt was non verbal, not following commands (not sure how much of this is chronic and baseline)   VITAL SIGNS: BP 89/57 mmHg  Pulse 98  Temp(Src) 96.3 F (35.7 C) (Axillary)  Resp 19  Ht 5\' 8"  (1.727 m)  Wt 125 lb 3.5 oz (56.8 kg)  BMI 19.04 kg/m2  SpO2 92%  HEMODYNAMICS:    VENTILATOR SETTINGS:    INTAKE / OUTPUT: I/O last 3 completed shifts: In: 4450.9 [I.V.:4400.9; IV Piggyback:50] Out: 406 [Urine:406]  PHYSICAL EXAMINATION: General:  No acute distress. No family at bedside. Awake, sitting in bed. Non verbal. Does not follow  commads > not sure how much of this is chronic and baseline.  Integument:  Warm & dry. No rash on exposed skin. RUE PICC in place. Lymphatics:  No appreciated cervical or supraclavicular lymphadenoapthy. HEENT:  Tacky mucus membranes. No scleral injection or icterus.  Cardiovascular:  Regular rate. No edema. No appreciable JVD. Pulses +1 Pulmonary:  Good aeration & clear to auscultation bilaterally, diminished on L. Normal work of breathing. Abdomen: Soft. BS+, Nondistended.  Musculoskeletal:  Symmetrically decreased muscle bulk. No joint deformity or effusion appreciated. Neurological:  Withdrawal to pain in all 4 extremities. Left gaze deviation that is chronic per prior records. Pupils symmetric. Psychiatric:  Unable to assess given altered mentation.   LABS:  BMET  Recent Labs Lab 05/19/16 1505 05/20/16 0111 05/20/16 0426  NA 140 141 143  K 5.3* 4.9 4.4  CL 107 112* 114*  CO2 26 21* 21*  BUN 24* 21* 19  CREATININE 0.97 0.90 1.25*  GLUCOSE 87 68 75    Electrolytes  Recent Labs Lab 05/19/16 1505 05/20/16 0111 05/20/16 0426  CALCIUM 9.8 8.3* 8.4*  MG  --   --  1.4*  PHOS  --   --  4.4    CBC  Recent Labs Lab 05/19/16 1505 05/20/16 0111 05/20/16 0426  WBC 8.8 6.8 10.8*  HGB 13.0 9.5* 11.4*  HCT 38.4* 29.1* 34.4*  PLT 133* 104* 122*    Coag's  Recent Labs Lab 05/19/16 2115  APTT 53*  INR 1.27    Sepsis Markers  Recent Labs Lab 05/19/16 2011 05/19/16 2115 05/20/16 0111  LATICACIDVEN 1.90 1.4 1.1  PROCALCITON  --  0.13  --     ABG  Recent Labs Lab 05/20/16 0108  PHART 7.387  PCO2ART 31.9*  PO2ART 64.0*    Liver Enzymes  Recent Labs Lab 05/19/16 1505 05/20/16 0426  AST 228* 128*  ALT 483* 328*  ALKPHOS 105 89  BILITOT 0.4 0.3  ALBUMIN 3.3* 2.6*    Cardiac Enzymes  Recent Labs Lab 05/20/16 0423 05/20/16 0805  TROPONINI <0.03 <0.03    Glucose  Recent Labs Lab 05/19/16 1848 05/20/16 0342 05/20/16 0844  GLUCAP  115* 82 100*    Imaging Ct Head Wo Contrast  05/19/2016  CLINICAL DATA:  Seizure.  Recent diagnosis of meningitis.  Dementia. EXAM: CT HEAD WITHOUT CONTRAST TECHNIQUE: Contiguous axial images were obtained from the base of the skull through the vertex without intravenous contrast. COMPARISON:  03/22/2016 FINDINGS: Brain: Mild diffuse atrophy as before. No evidence of acute infarction, hemorrhage, extra-axial collection, ventriculomegaly, or mass effect. Vascular: No hyperdense vessel or unexpected calcification. Skull: Negative for fracture or focal lesion. Sinuses/Orbits: No acute findings. Other: None. IMPRESSION: 1. Mild atrophy. Negative for bleed or other acute intracranial process. Electronically Signed   By: Corlis Leak M.D.   On: 05/19/2016 15:56   US Abdomen Limited  05/19/2016  CLINICAL DATA:  Abnormal liver function tests. EXAM: US ABDOMEN LIMITED - RIGHT UPPER QUADRANT COMPARISON:  11/13/2011 CT scan FINDINGS: Gallbladder: Multiple tiny gallstones are present measuring up to 3 mm diameter. These cause mild shadowing. There also appears to be tumefactive sludge in the gallbladder especially along the fundus, surrounding a small gallstone. This is mobile and does not demonstrate internal blood flow to suggest a polyp. There could be smaller polyps along the gallbladder wall given the specular tiny echogenicities along the gallbladder wall. Common bile duct: Diameter: 3 mm Liver: No focal lesion identified. Within normal limits in parenchymal echogenicity. IMPRESSION: 1. Multiple small gallstones in the gallbladder. There is some tumefactive sludge surrounding one of the gallstones, mobile within the gallbladder. The other gallstones are in the 3 mm size range. There may be some tiny polyps along the gallbladder wall but none measuring more than 3 mm in diameter. Hyperplastic cholecystosis with superimposed sludge and tiny gallstones not excluded. No overt gallbladder wall thickening. Sonographic  Murphy's sign absent. Electronically Signed   By: Gaylyn Rong M.D.   On: 05/19/2016 18:57   Dg Chest Portable 1 View  05/20/2016  CLINICAL DATA:  Increased shortness of breath tonight. EXAM: PORTABLE CHEST 1 VIEW COMPARISON:  05/09/2016 FINDINGS: PICC line tip in the SVC region. Slightly increased densities at the lung bases, left side greater than right. Upper lungs are clear. Heart size is normal and within normal limits. The trachea is midline. IMPRESSION: Increased densities along the medial lung bases bilaterally. Findings could represent atelectasis. Subtle infectious process is possible. Electronically Signed   By: Richarda Overlie M.D.   On: 05/20/2016 01:07   Dg Chest Port 1 View  05/19/2016  CLINICAL DATA:  Hypothermia EXAM: PORTABLE CHEST 1 VIEW COMPARISON:  03/20/2016 FINDINGS: The patient is rotated to the left on today's radiograph, reducing diagnostic sensitivity and specificity. Right PICC line tip: SVC. The lungs appear clear. Cardiac and mediastinal contours normal. No pleural effusion identified. IMPRESSION: 1. No acute findings. 2. Right PICC line tip:  SVC. Electronically Signed   By: Gaylyn Rong M.D.   On: 05/19/2016 15:04     STUDIES:  PORT CXR 6/15: Chronic elevation of left hemidiaphragm. No focal opacity or effusion appreciated. CT  HEAD W/O 6/15: Mild atrophy. No evidence of acute infarction, hemorrhage, or mass effect. [place] ABD LIMITED 6/15: Multiple small gallstones within the gallbladder. Some tumefactive sludge surrounding one of gallstones. Other gallstones are 3 mm range in size. Some tiny polyps along the gallbladder wall but none more than 3 mm. No overt gallbladder wall thickening. Absent sonographic Murphy sign. PORT CXR 6/16:  No focal opacification but hazy densities and lower lung zones consistent with atelectasis. EEG 6/16 >>   MICROBIOLOGY: Blood Ctx x2 6/15 >> GPC >>  BC Reflex 6/15 >> strep species identified + Urine Ctx 6/15 >> MRSA PCR 6/16  >> U. Strep Antigen 6/16 >> negative  ANTIBIOTICS: Vancomycin 6/15 >> Rocephin 6/15 >>  SIGNIFICANT EVENTS: 3/14 - 4/06 - Admit w/ Strep pneumo meningitis, cardiogenic shock & cardiac arrest. ARDS s/p trach & removal. 4/16 - 5/01 - Readmit after fall w/ AMS.  5/24 - 5/26 - Readmitted for SNF placement 6/15 - Admit from SNF w/ decreased mentation 6/16 - Consult for hypoxia  LINES/TUBES: FOLEY 6/15 >> R DL PICC 8/11 >>  DISCUSSION:  50 year old male with history of Streptococcus pneumoniae meningitis as well as indwelling PICC line. Admitted 6/15 with decreased level of consciousness/activity and witnessed seizure. Hypothermic on arrival. Suspected underlying sepsis with urinary tract infection as likely source. Continuing Keppra IV and consult neurology. Awaiting cultures. Continuing empiric vancomycin & Rocephin with pharmacy to dose. Attempted 6/15 to contact multiple family members today to discuss CODE STATUS but was unsuccessful.  ASSESSMENT / PLAN:  PULMONARY A: Acute Hypoxic Respiratory Failure - Resolved with NT suctioning. Poor cough reflex and has a nasal trumpet.   P:   Continuous Pulse Ox. Supplemental oxygen to maintain Sat >94%. Aspiration precautions  Check CXR in am (cxr on 6/15 with possible LLL infiltrate) Start duoneb to facilitate mucus clearing. May need bed cpt  NEUROLOGIC A:   Witnessed Seizure - Hasn't received antiepileptic medications. H/O Strep pneumo Meningitis H/O Anoxic Brain Injury - Baseline nonverbal status.  P:   RASS goal: 0 Neurology Consult Keppra IV bid Seizure Precautions Ativan IV prn Serial neuro exams Defer EEG to Neurology  CARDIOVASCULAR A:  Shock - Likely secondary to sepsis. Tachycardia - Sinus on telemetry.  P:  Monitor on telemetry Vitals per unit protocol Phenylephrine to maintain MAP >65 & SBP >90 Trending Troponin   Await TTE  500 ml NS bolus x1  Consider TEE to rule out endocarditis if with persistent  bacteremia;  await blood cultures  RENAL A:   Hyperkalemia - Mild. Normal renal function. Hyperchloremia  Hypomagnesemia AKI likely 2/2 to sepsis/volume depletion P:   Trending UOP with Foley Repeat electrolytes & renal function w/ AM labs Replace electrolytes as indicated Change MIVF to D5 1/2 NS at 125  GASTROINTESTINAL A:   Transaminitis - Neg RUQ U/S. Protein Calorie Malnutrition  At Risk Aspiration  P:   NPO Trend LFTs   Pepcid IV daily Place coretrack and being feeding per nutrition Check LFTs in am  HEMATOLOGIC A:   Thrombocytopenia  P:  Trending cell counts daily w/ CBC SCDs Heparin [place] q8hr  INFECTIOUS A:   Sepsis  H/O Strep pneumo Meningitis UTI  P:   ABX as above  Follow cultures Droplet isolation PCT per algorithm > PCT was 0.13 on admit. If clinically better over the weekend, maybe d/c vanc and keep rocephin for UTI unless CXR is worse.  Consider TEE if with persistent bacteremia.  Had negative TEE 03/29/16.  ENDOCRINE  A:   At risk for hypoglycemia.  P:   Accu-Checks q4hr MIVF to d5/12 NS @ 125 ml/hr   FAMILY  - Updates: No family at bedside am 6/16.   - Inter-disciplinary family meet or Palliative Care meeting due by:  6/23   Canary Brim, NP-C [facility] Pgr: [phone] or if no answer [phone] 05/20/2016, 10:42 AM    ATTENDING NOTE / ATTESTATION NOTE :   I have discussed the case with the resident/APP Canary Brim.  I agree with the resident/APP's  history, physical examination, assessment, and plans.  I have edited the above note and modified it according to our agreed history, physical examination, assessment and plan.   I have spent 32 minutes of critical care time with this patient today.  Family :Family updated at length today.  No family at bedside.    Pollie Meyer, MD 05/20/2016, 11:22 AM [facility] Pager [phone] After 3 pm or if no answer, call  [phone]

## 2016-05-20 NOTE — Progress Notes (Signed)
Initial Nutrition Assessment  DOCUMENTATION CODES:   Non-severe (moderate) malnutrition in context of chronic illness  INTERVENTION:   Initiate Jevity 1.2 @ 65 ml/hr via Cortrak 30 ml Prostat daily Provides: 1972 kcal, 101 grams protein, and 1263 ml H2O.   NUTRITION DIAGNOSIS:   Malnutrition (Moderate) related to chronic illness (meningitis) as evidenced by moderate depletions of muscle mass, moderate depletion of body fat.  GOAL:   Patient will meet greater than or equal to 90% of their needs  MONITOR:   Diet advancement, TF tolerance, Weight trends  REASON FOR ASSESSMENT:   Consult Enteral/tube feeding initiation and management  ASSESSMENT:   Pt with hx of strep pneumo meningitis, cardiogenic shock and arrest 3/14-4/6, readmitted after fall with AMS 4/16-5/1, readmitted for SNF placement 5/24-5/26, now admitted 6/15 from SNF with decreased mentation and witnessed seizure. Pt was hypothermic on arrival, suspect underlying sepsis with UTI as likely source.    Pt unable to answer questions.  Cortrak placed, xray pending but appears to be in the small bowel  Medications reviewed and include: thiamine, phenylephrine Labs reviewed: magnesium 1.4, AST/ALT elevated 128/328 Nutrition-Focused physical exam completed. Findings are moderate fat depletion, moderate muscle depletion, and no edema.  Pt discussed during ICU rounds and with RN.    Diet Order:  Diet NPO time specified  Skin:  Reviewed, no issues  Last BM:  unknown  Height:   Ht Readings from Last 1 Encounters:  05/20/16 5\' 8"  (1.727 m)    Weight:   Wt Readings from Last 1 Encounters:  05/20/16 125 lb 3.5 oz (56.8 kg)    Ideal Body Weight:  70 kg  BMI:  Body mass index is 19.04 kg/(m^2).  Estimated Nutritional Needs:   Kcal:  1850-2100  Protein:  90-115 grams  Fluid:  >2 L/day  EDUCATION NEEDS:   No education needs identified at this time  Kendell BaneHeather Dillie Burandt RD, LDN, CNSC [phone]  Pager [phone] After Hours Pager

## 2016-05-20 NOTE — ED Provider Notes (Signed)
I was called to room for altered mental status and hypoxia Pt with eyes closed and not responding to voice He is spontaneously breathing Nasal trumpet applied and applied NRB mask His o2 sat improved [facility] dr gardner at bedside Pt will likely need ICU care Critical care paged D/w dr deterding who is arranging for dr Jamison Neighbornestor to see patient   Zadie Rhineonald Dontrelle Mazon, MD 05/20/16 [phone]

## 2016-05-20 NOTE — Progress Notes (Signed)
  Echocardiogram 2D Echocardiogram has been performed.  Dale [name], Dale [name] 05/20/2016, 4:14 PM

## 2016-05-20 NOTE — Progress Notes (Signed)
PHARMACY - PHYSICIAN COMMUNICATION CRITICAL VALUE ALERT - BLOOD CULTURE IDENTIFICATION (BCID)  Results for orders placed or performed during the hospital encounter of 05/19/16  Blood Culture ID Panel (Reflexed) (Collected: 05/19/2016  3:18 PM)  Result Value Ref Range   Enterococcus species NOT DETECTED NOT DETECTED   Vancomycin resistance NOT DETECTED NOT DETECTED   Listeria monocytogenes NOT DETECTED NOT DETECTED   Staphylococcus species NOT DETECTED NOT DETECTED   Staphylococcus aureus NOT DETECTED NOT DETECTED   Methicillin resistance NOT DETECTED NOT DETECTED   Streptococcus species DETECTED (A) NOT DETECTED   Streptococcus agalactiae NOT DETECTED NOT DETECTED   Streptococcus pneumoniae NOT DETECTED NOT DETECTED   Streptococcus pyogenes NOT DETECTED NOT DETECTED   Acinetobacter baumannii NOT DETECTED NOT DETECTED   Enterobacteriaceae species NOT DETECTED NOT DETECTED   Enterobacter cloacae complex NOT DETECTED NOT DETECTED   Escherichia coli NOT DETECTED NOT DETECTED   Klebsiella oxytoca NOT DETECTED NOT DETECTED   Klebsiella pneumoniae NOT DETECTED NOT DETECTED   Proteus species NOT DETECTED NOT DETECTED   Serratia marcescens NOT DETECTED NOT DETECTED   Carbapenem resistance NOT DETECTED NOT DETECTED   Haemophilus influenzae NOT DETECTED NOT DETECTED   Neisseria meningitidis NOT DETECTED NOT DETECTED   Pseudomonas aeruginosa NOT DETECTED NOT DETECTED   Candida albicans NOT DETECTED NOT DETECTED   Candida glabrata NOT DETECTED NOT DETECTED   Candida krusei NOT DETECTED NOT DETECTED   Candida parapsilosis NOT DETECTED NOT DETECTED   Candida tropicalis NOT DETECTED NOT DETECTED    Name of physician (or Provider) Contacted: Dr. Clayborn BignesseDios  Changes to prescribed antibiotics required: Continue current care  Isaac BlissMichael Jaimey Franchini, PharmD, BCPS, [facility] Center[facility]CCCP Clinical Pharmacist Pager [phone] 05/20/2016 10:18 AM

## 2016-05-20 NOTE — ED Notes (Signed)
Critical Care MD & Hospitalist at bedside at this time.

## 2016-05-20 NOTE — ED Notes (Signed)
Discontinued bear hugger. Temp now elevated via temp foley.

## 2016-05-20 NOTE — Care Management Note (Signed)
Case Management Note  Patient Details  Name: Dale [name] MRN: [id number] Date of Birth: 05/02/1966  Subjective/Objective:    Pt admitted on 05/19/16 with septic shock.  PTA, pt resided at [facility].                Action/Plan: Referral to CSW to facilitate possible return to SNF when medically stable for dc.  Will follow progress.    Expected Discharge Date:                  Expected Discharge Plan:  Skilled Nursing Facility  In-House Referral:  Clinical Social Work  Discharge planning Services  CM Consult  Post Acute Care Choice:    Choice offered to:     DME Arranged:    DME Agency:     HH Arranged:    HH Agency:     Status of Service:  In process, will continue to follow  Medicare Important Message Given:    Date Medicare IM Given:    Medicare IM give by:    Date Additional Medicare IM Given:    Additional Medicare Important Message give by:     If discussed at Long Length of Stay Meetings, dates discussed:    Additional Comments:  Quintella BatonJulie W. Jarom Govan, RN, BSN  Trauma/Neuro ICU Case Manager [phone]

## 2016-05-21 ENCOUNTER — Inpatient Hospital Stay (HOSPITAL_COMMUNITY): Payer: Medicaid Other

## 2016-05-21 DIAGNOSIS — R1310 Dysphagia, unspecified: Secondary | ICD-10-CM

## 2016-05-21 LAB — GLUCOSE, CAPILLARY
GLUCOSE-CAPILLARY: 110 mg/dL — AB (ref 65–99)
Glucose-Capillary: 107 mg/dL — ABNORMAL HIGH (ref 65–99)
Glucose-Capillary: 92 mg/dL (ref 65–99)
Glucose-Capillary: 80 mg/dL (ref 65–99)
Glucose-Capillary: 115 mg/dL — ABNORMAL HIGH (ref 65–99)
Glucose-Capillary: 128 mg/dL — ABNORMAL HIGH (ref 65–99)

## 2016-05-21 LAB — CBC
HCT: 27.5 % — ABNORMAL LOW (ref 39.0–52.0)
Hemoglobin: 9.1 g/dL — ABNORMAL LOW (ref 13.0–17.0)
MCH: 28.2 pg (ref 26.0–34.0)
MCHC: 33.1 g/dL (ref 30.0–36.0)
MCV: 85.1 fL (ref 78.0–100.0)
PLATELETS: 86 10*3/uL — AB (ref 150–400)
RBC: 3.23 MIL/uL — ABNORMAL LOW (ref 4.22–5.81)
RDW: 15.9 % — AB (ref 11.5–15.5)
WBC: 8.6 10*3/uL (ref 4.0–10.5)

## 2016-05-21 LAB — COMPREHENSIVE METABOLIC PANEL
ALBUMIN: 2.1 g/dL — AB (ref 3.5–5.0)
ALK PHOS: 71 U/L (ref 38–126)
ALT: 208 U/L — AB (ref 17–63)
ANION GAP: 7 (ref 5–15)
AST: 73 U/L — AB (ref 15–41)
BILIRUBIN TOTAL: 0.3 mg/dL (ref 0.3–1.2)
BUN: 18 mg/dL (ref 6–20)
CALCIUM: 8.4 mg/dL — AB (ref 8.9–10.3)
CO2: 20 mmol/L — AB (ref 22–32)
CREATININE: 1.13 mg/dL (ref 0.61–1.24)
Chloride: 111 mmol/L (ref 101–111)
GFR calc Af Amer: >60 mL/min (ref >60)
GFR calc non Af Amer: >60 mL/min (ref >60)
GLUCOSE: 68 mg/dL (ref 65–99)
Potassium: 3.7 mmol/L (ref 3.5–5.1)
SODIUM: 138 mmol/L (ref 135–145)
TOTAL PROTEIN: 4.8 g/dL — AB (ref 6.5–8.1)

## 2016-05-21 LAB — MAGNESIUM
MAGNESIUM: 1.9 mg/dL (ref 1.7–2.4)
MAGNESIUM: 2.4 mg/dL (ref 1.7–2.4)

## 2016-05-21 LAB — C DIFFICILE QUICK SCREEN W PCR REFLEX
C Diff antigen: POSITIVE — AB
C Diff toxin: NEGATIVE

## 2016-05-21 LAB — URINE CULTURE: Culture: <10000 — AB

## 2016-05-21 LAB — HEMOGLOBIN A1C
HEMOGLOBIN A1C: 5.3 % (ref 4.8–5.6)
MEAN PLASMA GLUCOSE: 105 mg/dL

## 2016-05-21 LAB — PHOSPHORUS
PHOSPHORUS: 5.1 mg/dL — AB (ref 2.5–4.6)
Phosphorus: 2.6 mg/dL (ref 2.5–4.6)

## 2016-05-21 LAB — PROCALCITONIN: Procalcitonin: 2.3 ng/mL

## 2016-05-21 MED ORDER — CHLORHEXIDINE GLUCONATE 0.12 % MT SOLN
15.0000 mL | Freq: Two times a day (BID) | OROMUCOSAL | Status: DC
  Administered 2016-05-21 – 2016-06-01 (×22): 15 mL via OROMUCOSAL
  Filled 2016-05-21 (×15): qty 15

## 2016-05-21 MED ORDER — PHENYLEPHRINE HCL 10 MG/ML IJ SOLN
0.0000 ug/min | INTRAVENOUS | Status: DC
  Administered 2016-05-21: 100 ug/min via INTRAVENOUS
  Administered 2016-05-21: 20 ug/min via INTRAVENOUS
  Administered 2016-05-21: 200 ug/min via INTRAVENOUS
  Administered 2016-05-21: 150 ug/min via INTRAVENOUS
  Administered 2016-05-21: 55 ug/min via INTRAVENOUS
  Administered 2016-05-22: 25 ug/min via INTRAVENOUS
  Administered 2016-05-22: 70 ug/min via INTRAVENOUS
  Filled 2016-05-21 (×6): qty 4

## 2016-05-21 MED ORDER — SODIUM CHLORIDE 0.9 % IV BOLUS (SEPSIS)
500.0000 mL | Freq: Once | INTRAVENOUS | Status: AC
  Administered 2016-05-21: 500 mL via INTRAVENOUS

## 2016-05-21 MED ORDER — CETYLPYRIDINIUM CHLORIDE 0.05 % MT LIQD
7.0000 mL | Freq: Two times a day (BID) | OROMUCOSAL | Status: DC
  Administered 2016-05-21 – 2016-06-01 (×23): 7 mL via OROMUCOSAL

## 2016-05-21 MED ORDER — POTASSIUM PHOSPHATES 15 MMOLE/5ML IV SOLN
30.0000 mmol | Freq: Once | INTRAVENOUS | Status: AC
  Administered 2016-05-21: 30 mmol via INTRAVENOUS
  Filled 2016-05-21: qty 10

## 2016-05-21 MED ORDER — MAGNESIUM SULFATE 2 GM/50ML IV SOLN
2.0000 g | Freq: Once | INTRAVENOUS | Status: AC
  Administered 2016-05-21: 2 g via INTRAVENOUS
  Filled 2016-05-21: qty 50

## 2016-05-21 NOTE — Consult Note (Signed)
EEG Report  Clinical history:  Change in mental status.  Recent episode of pneumococcal meningitis in 02/2016 complicated by cardiac arrest and hypoxic brain injury.  Technical Summary: A 19 channel digital EEG was performed using the 10-20 international system of electrode placement.  Bipolar and Referential montages were utilized.  Total recording time was 22 minutes and 12 seconds.  Findings:  There is prominent anterior beta frequency activity bilaterally suggestive of benzodiazepine or barbiturate administration.  Posterior dominant frequencies are in the theta frequency range bilaterally.  There are no epileptiform discharges or electrographic seizures present.    Impression:  This is an abnormal EEG.  There is evidence of moderate generalized slowing of brain activity consistent with a global brain injury such as seen in hypoxia, metabolic, toxic, or infectious etiologies. Clinical correlation is recommended.  The patient is not in non-convulsive status epilepticus and there is no evidence of seizure tendency on this recording.

## 2016-05-21 NOTE — Progress Notes (Signed)
   Dr. Darrick Pennaeterding paged regarding pt's hemodynamic and neuro status. His pressor support requirements increased and he remains hypotensive, tachycardic to 120-130s, hypothermic as low as 92.1 rectally (temp improved with bair hugger) and he is more lethargic and difficult to arouse. He has no gag and a weak cough,  NT suctioned ~ 4 times; thick white secretions.  He also had multiple loose BMs. C Diff is pending.  Per Dr. Darrick Pennaeterding an A line was inserted and 500 cc bolus was given. No other interventions at this time.

## 2016-05-21 NOTE — Progress Notes (Signed)
PULMONARY / CRITICAL CARE MEDICINE   Name: Dale [name] MRN: [id number] DOB: August 02, 1966    ADMISSION DATE:  05/19/2016 CONSULTATION DATE:  05/20/2016  REFERRING MD:  Lyda Perone, D.O. / TRH  CHIEF COMPLAINT:  Acute Hypoxic Respiratory Failure  HISTORY OF PRESENT ILLNESS:  History obtained from bedside physician & electronic medical record given altered mentation. Patient has had a complicated past medical course including admission with strep pneumonia meningitis and hospital course complicated by cardiac arrest and cardiogenic shock in March 2016. Patient has had subsequent readmissions until ultimately ended up in a skilled nursing facility. Patient was ultimately transitioned to Rocephin with plans for infectious diseases follow-up before discontinuing. Per electronic medical record on 6/15 the patient had decreased mentation with his baseline nonverbal but normally walking. Patient became nonambulatory. He ultimately had a seizure which prompted transfer to the emergency department. Reportedly they did not give the patient his seizure medications today. In the emergency department the patient was given a load of Depacon and 1 g of IV Keppra. He was noted to be hypothermic as well.    SUBJECTIVE:     VITAL SIGNS: BP 105/73 mmHg  Pulse 103  Temp(Src) 96.6 F (35.9 C) (Rectal)  Resp 16  Ht  (1.727 m)  Wt 56.8 kg (125 lb 3.5 oz)  BMI 19.04 kg/m2  SpO2 96%  HEMODYNAMICS:    VENTILATOR SETTINGS:    INTAKE / OUTPUT: I/O last 3 completed shifts: In: 40981 [I.V.:8350.5; XB/JY:7829.5; IV Piggyback:1770] Out: 2141 [Urine:2041; Stool:100]  PHYSICAL EXAMINATION: General:  No acute distress. No family at bedside. Arousable but confused, non-verbal at baseline.  Integument:  Warm & dry. No rash on exposed skin. RUE PICC in place. Lymphatics:  No appreciated cervical or supraclavicular lymphadenoapthy. HEENT:  MMM. No scleral injection or icterus.  Cardiovascular:  Regular  rate. No edema. No appreciable JVD. Pulses +1 Pulmonary:  Good aeration & clear to auscultation bilaterally, diminished on L. Normal work of breathing. Abdomen: Soft. BS+, Nondistended.  Musculoskeletal:  Symmetrically decreased muscle bulk. No joint deformity or effusion appreciated. Neurological:  Withdrawal to pain in all 4 extremities. Left gaze deviation that is chronic per prior records. Pupils symmetric. Psychiatric:  Unable to assess given altered mentation.   LABS:  BMET  Recent Labs Lab 05/20/16 0111 05/20/16 0426 05/21/16 0400  NA 141 143 138  K 4.9 4.4 3.7  CL 112* 114* 111  CO2 21* 21* 20*  BUN 21* 19 18  CREATININE 0.90 1.25* 1.13  GLUCOSE 68 75 68   Electrolytes  Recent Labs Lab 05/20/16 0111  05/20/16 0426 05/20/16 1235 05/20/16 1708 05/21/16 0400  CALCIUM 8.3*  --  8.4*  --   --  8.4*  MG  --   < > 1.4* 1.4* 2.2 1.9  PHOS  --   < > 4.4 5.3* 4.3 2.6  < > = values in this interval not displayed.  CBC  Recent Labs Lab 05/20/16 0111 05/20/16 0426 05/21/16 0400  WBC 6.8 10.8* 8.6  HGB 9.5* 11.4* 9.1*  HCT 29.1* 34.4* 27.5*  PLT 104* 122* 86*   Coag's  Recent Labs Lab 05/19/16 2115  APTT 53*  INR 1.27   Sepsis Markers  Recent Labs Lab 05/19/16 2011 05/19/16 2115 05/20/16 0111 05/20/16 1235 05/21/16 0400  LATICACIDVEN 1.90 1.4 1.1  --   --   PROCALCITON  --  0.13  --  1.00 2.30   ABG  Recent Labs Lab 05/20/16 0108  PHART 7.387  PCO2ART 31.9*  PO2ART 64.0*   Liver Enzymes  Recent Labs Lab 05/19/16 1505 05/20/16 0426 05/21/16 0400  AST 228* 128* 73*  ALT 483* 328* 208*  ALKPHOS 105 89 71  BILITOT 0.4 0.3 0.3  ALBUMIN 3.3* 2.6* 2.1*   Cardiac Enzymes  Recent Labs Lab 05/20/16 0423 05/20/16 0805 05/20/16 1235  TROPONINI <0.03 <0.03 <0.03   Glucose  Recent Labs Lab 05/20/16 1143 05/20/16 1512 05/20/16 1935 05/20/16 2318 05/21/16 0320 05/21/16 0734  GLUCAP 93 103* 115* 107* 92 80   Imaging Dg Chest  Port 1 View  05/21/2016  CLINICAL DATA:  Pneumonia EXAM: PORTABLE CHEST 1 VIEW COMPARISON:  Chest radiograph from one day prior. FINDINGS: Right PICC terminates in the lower third of the superior vena cava. Enteric tube terminates in the proximal stomach. Stable cardiomediastinal silhouette with normal heart size. No pneumothorax. No pleural effusion. Patchy consolidation in the mid to lower left lung is increased. IMPRESSION: Increased patchy consolidation in the mid to lower left lung, most consistent with pneumonia. Electronically Signed   By: Delbert Phenix M.D.   On: 05/21/2016 08:35   Dg Abd Portable 1v  05/20/2016  CLINICAL DATA:  Feeding tube placement EXAM: PORTABLE ABDOMEN - 1 VIEW COMPARISON:  03/29/2016 FINDINGS: Feeding tube tip at the ligament of Treitz. Normal bowel gas pattern. Increase in left basilar opacity seen on comparison chest x-ray. IMPRESSION: 1. Feeding tube tip at the ligament of Treitz. 2. Increase in left basilar airspace disease compared to chest x-ray earlier today, concerning for aspiration or pneumonia. Electronically Signed   By: Marnee Spring M.D.   On: 05/20/2016 13:06   STUDIES:  PORT CXR 6/15: Chronic elevation of left hemidiaphragm. No focal opacity or effusion appreciated. CT HEAD W/O 6/15: Mild atrophy. No evidence of acute infarction, hemorrhage, or mass effect. [place] ABD LIMITED 6/15: Multiple small gallstones within the gallbladder. Some tumefactive sludge surrounding one of gallstones. Other gallstones are 3 mm range in size. Some tiny polyps along the gallbladder wall but none more than 3 mm. No overt gallbladder wall thickening. Absent sonographic Murphy sign. PORT CXR 6/16:  No focal opacification but hazy densities and lower lung zones consistent with atelectasis. EEG 6/16 >>   MICROBIOLOGY: Blood Ctx x2 6/15 >> GPC >>  BC Reflex 6/15 >> strep species identified + Urine Ctx 6/15 >> MRSA PCR 6/16 >> U. Strep Antigen 6/16 >>  negative  ANTIBIOTICS: Vancomycin 6/15 >> Rocephin 6/15 >>  SIGNIFICANT EVENTS: 3/14 - 4/06 - Admit w/ Strep pneumo meningitis, cardiogenic shock & cardiac arrest. ARDS s/p trach & removal. 4/16 - 5/01 - Readmit after fall w/ AMS.  5/24 - 5/26 - Readmitted for SNF placement 6/15 - Admit from SNF w/ decreased mentation 6/16 - Consult for hypoxia  LINES/TUBES: FOLEY 6/15 >> R DL PICC 7/84 >>  DISCUSSION:  50 year old male with history of Streptococcus pneumoniae meningitis as well as indwelling PICC line. Admitted 6/15 with decreased level of consciousness/activity and witnessed seizure. Hypothermic on arrival. Suspected underlying sepsis with urinary tract infection as likely source. Continuing Keppra IV and consult neurology. Awaiting cultures. Continuing empiric vancomycin & Rocephin with pharmacy to dose. Attempted 6/15 to contact multiple family members today to discuss CODE STATUS but was unsuccessful.  ASSESSMENT / PLAN:  PULMONARY A: Acute Hypoxic Respiratory Failure - Resolved with NT suctioning. Poor cough reflex and has a nasal trumpet.   P:   Continuous Pulse Ox. Supplemental oxygen to maintain Sat >94%. Aspiration precautions  Check CXR  in am (cxr on 6/15 with possible LLL infiltrate) Duoneb to facilitate mucus clearing. May need bed cpt Monitor closely for airway protection. Need to address code status.  NEUROLOGIC A:   Witnessed Seizure - Hasn't received antiepileptic medications. H/O Strep pneumo Meningitis H/O Anoxic Brain Injury - Baseline nonverbal status.  P:   RASS goal: 0 Neurology Consult Keppra IV bid Seizure Precautions Ativan IV prn Serial neuro exams Defer EEG to Neurology  CARDIOVASCULAR A:  Shock - Likely secondary to sepsis. Tachycardia - Sinus on telemetry.  P:  Monitor on telemetry Vitals per unit protocol Phenylephrine to maintain MAP >65 & SBP >90 Trending Troponin   TTE valves ok and EF of 40-45%. Consider TEE to rule out  endocarditis if with persistent bacteremia;  await blood cultures  RENAL A:   Hyperkalemia - Mild. Normal renal function. Hyperchloremia  Hypomagnesemia AKI likely 2/2 to sepsis/volume depletion P:   Trending UOP with Foley Repeat electrolytes & renal function w/ AM labs Replace electrolytes as indicated KVO IVF  GASTROINTESTINAL A:   Transaminitis - Neg RUQ U/S. Protein Calorie Malnutrition  At Risk Aspiration  P:   TF per nutrition. Trend LFTs. Pepcid IV daily  HEMATOLOGIC A:   Thrombocytopenia  P:  Trending cell counts daily w/ CBC SCDs Heparin [place] q8hr  INFECTIOUS A:   Sepsis  H/O Strep pneumo Meningitis UTI  P:   ABX as above  Follow cultures Droplet isolation PCT per algorithm > PCT was 0.13 on admit. If clinically better over the weekend, maybe d/c vanc and keep rocephin for UTI unless CXR is worse.  Consider TEE if with persistent bacteremia.  Had negative TEE 03/29/16.  ENDOCRINE A:   At risk for hypoglycemia.  P:   Accu-Checks q4hr MIVF to d5/12 NS @ 125 ml/hr   FAMILY  - Updates: No family at bedside am 6/17.   - Inter-disciplinary family meet or Palliative Care meeting due by:  6/23  The patient is critically ill with multiple organ systems failure and requires high complexity decision making for assessment and support, frequent evaluation and titration of therapies, application of advanced monitoring technologies and extensive interpretation of multiple databases.   Critical Care Time devoted to patient care services described in this note is  35  Minutes. This time reflects time of care of this signee Dr Koren BoundWesam Yacoub. This critical care time does not reflect procedure time, or teaching time or supervisory time of PA/NP/Med student/Med Resident etc but could involve care discussion time.  Alyson ReedyWesam G. Yacoub, M.D. [facility] Pulmonary/Critical Care Medicine. Pager: [phone]. After hours pager: [phone].

## 2016-05-21 NOTE — Procedures (Signed)
Arterial Catheter Insertion Procedure Note Dale HollowCraig D [name] [id number] 07/31/1966  Procedure: Insertion of Arterial Catheter  Indications: Blood pressure monitoring  Procedure Details Consent: Unable to obtain consent because of altered level of consciousness. Time Out: Verified patient identification, verified procedure, site/side was marked, verified correct patient position, special equipment/implants available, medications/allergies/relevent history reviewed, required imaging and test results available.  Performed  Maximum sterile technique was used including antiseptics, cap, gloves, gown, hand hygiene, mask and sheet. Skin prep: Chlorhexidine; local anesthetic administered 22 gauge catheter was inserted into left radial artery using the Seldinger technique.  Evaluation Blood flow good; BP tracing good. Complications: No apparent complications.   Dale [name], Dale [name] Dale 05/21/2016

## 2016-05-22 LAB — CBC
HCT: 27.1 % — ABNORMAL LOW (ref 39.0–52.0)
Hemoglobin: 9.1 g/dL — ABNORMAL LOW (ref 13.0–17.0)
MCH: 28.2 pg (ref 26.0–34.0)
MCHC: 33.6 g/dL (ref 30.0–36.0)
MCV: 83.9 fL (ref 78.0–100.0)
PLATELETS: 65 10*3/uL — AB (ref 150–400)
RBC: 3.23 MIL/uL — ABNORMAL LOW (ref 4.22–5.81)
RDW: 15.9 % — AB (ref 11.5–15.5)
WBC: 8.5 10*3/uL (ref 4.0–10.5)

## 2016-05-22 LAB — GLUCOSE, CAPILLARY
GLUCOSE-CAPILLARY: 106 mg/dL — AB (ref 65–99)
GLUCOSE-CAPILLARY: 93 mg/dL (ref 65–99)
GLUCOSE-CAPILLARY: 102 mg/dL — AB (ref 65–99)
GLUCOSE-CAPILLARY: 106 mg/dL — AB (ref 65–99)
Glucose-Capillary: 93 mg/dL (ref 65–99)
Glucose-Capillary: 113 mg/dL — ABNORMAL HIGH (ref 65–99)
Glucose-Capillary: 119 mg/dL — ABNORMAL HIGH (ref 65–99)

## 2016-05-22 LAB — CULTURE, BLOOD (ROUTINE X 2)

## 2016-05-22 LAB — BASIC METABOLIC PANEL
ANION GAP: 7 (ref 5–15)
BUN: 13 mg/dL (ref 6–20)
CO2: 23 mmol/L (ref 22–32)
Calcium: 8.6 mg/dL — ABNORMAL LOW (ref 8.9–10.3)
Chloride: 109 mmol/L (ref 101–111)
Creatinine, Ser: 1.14 mg/dL (ref 0.61–1.24)
GLUCOSE: 110 mg/dL — AB (ref 65–99)
Potassium: 4.3 mmol/L (ref 3.5–5.1)
Sodium: 139 mmol/L (ref 135–145)

## 2016-05-22 LAB — PROCALCITONIN: PROCALCITONIN: 1.18 ng/mL

## 2016-05-22 LAB — MAGNESIUM: Magnesium: 2.2 mg/dL (ref 1.7–2.4)

## 2016-05-22 LAB — PHOSPHORUS: Phosphorus: 4.9 mg/dL — ABNORMAL HIGH (ref 2.5–4.6)

## 2016-05-22 NOTE — Progress Notes (Signed)
EEG revealed evidence of moderate generalized slowing of brain activity consistent with a global brain injury such as seen in hypoxia, metabolic, toxic, or infectious etiologies. Also could be secondary to postictal state. EEG was negative for non-convulsive status epilepticus and there was no evidence of seizure tendency on the recording.   Recommendations: 1. Continue Keppra at current dosage regimen.  2. Neurology consultation service will continue to follow.   Electronically signed: Dr. Caryl PinaEric Draken Farrior

## 2016-05-22 NOTE — Progress Notes (Addendum)
PULMONARY / CRITICAL CARE MEDICINE   Name: Dale HollowCraig D Perz MRN: [id number] DOB: 11/06/1966    ADMISSION DATE:  05/19/2016 CONSULTATION DATE:  05/20/2016  REFERRING MD:  Lyda PeroneJared Gardner, D.O. / TRH  CHIEF COMPLAINT:  Acute Hypoxic Respiratory Failure  HISTORY OF PRESENT ILLNESS:  History obtained from bedside physician & electronic medical record given altered mentation. Patient has had a complicated past medical course including admission with strep pneumonia meningitis and hospital course complicated by cardiac arrest and cardiogenic shock in March 2016. Patient has had subsequent readmissions until ultimately ended up in a skilled nursing facility. Patient was ultimately transitioned to Rocephin with plans for infectious diseases follow-up before discontinuing. Per electronic medical record on 6/15 the patient had decreased mentation with his baseline nonverbal but normally walking. Patient became nonambulatory. He ultimately had a seizure which prompted transfer to the emergency department. Reportedly they did not give the patient his seizure medications today. In the emergency department the patient was given a load of Depacon and 1 g of IV Keppra. He was noted to be hypothermic as well.    SUBJECTIVE:   No events overnight, more responsive this am.  VITAL SIGNS: BP 107/70 mmHg  Pulse 118  Temp(Src) 100.6 F (38.1 C) (Core (Comment))  Resp 15  Ht 5\' 8"  (1.727 m)  Wt 58.8 kg (129 lb 10.1 oz)  BMI 19.71 kg/m2  SpO2 99%  HEMODYNAMICS:    VENTILATOR SETTINGS:    INTAKE / OUTPUT: I/O last 3 completed shifts: In: 8399.4 [I.V.:3674.9; NG/GT:2714.5; IV Piggyback:2010] Out: 5620 [Urine:5270; Stool:350]  PHYSICAL EXAMINATION: General:  No acute distress. No family at bedside. Arousable but confused, non-verbal at baseline.  Integument:  Warm & dry. No rash on exposed skin. RUE PICC in place. Lymphatics:  No appreciated cervical or supraclavicular lymphadenoapthy. HEENT:  MMM. No  scleral injection or icterus.  Cardiovascular:  Regular rate. No edema. No appreciable JVD. Pulses +1 Pulmonary:  Good aeration & clear to auscultation bilaterally, diminished on L. Normal work of breathing. Abdomen: Soft. BS+, Nondistended.  Musculoskeletal:  Symmetrically decreased muscle bulk. No joint deformity or effusion appreciated. Neurological:  Withdrawal to pain in all 4 extremities. Left gaze deviation that is chronic per prior records. Pupils symmetric. Psychiatric:  Unable to assess given altered mentation.   LABS:  BMET  Recent Labs Lab 05/20/16 0426 05/21/16 0400 05/22/16 0500  NA 143 138 139  K 4.4 3.7 4.3  CL 114* 111 109  CO2 21* 20* 23  BUN 19 18 13   CREATININE 1.25* 1.13 1.14  GLUCOSE 75 68 110*   Electrolytes  Recent Labs Lab 05/20/16 0426  05/21/16 0400 05/21/16 1658 05/22/16 0500  CALCIUM 8.4*  --  8.4*  --  8.6*  MG 1.4*  < > 1.9 2.4 2.2  PHOS 4.4  < > 2.6 5.1* 4.9*  < > = values in this interval not displayed.  CBC  Recent Labs Lab 05/20/16 0426 05/21/16 0400 05/22/16 0500  WBC 10.8* 8.6 8.5  HGB 11.4* 9.1* 9.1*  HCT 34.4* 27.5* 27.1*  PLT 122* 86* 65*   Coag's  Recent Labs Lab 05/19/16 2115  APTT 53*  INR 1.27   Sepsis Markers  Recent Labs Lab 05/19/16 2011  05/19/16 2115 05/20/16 0111 05/20/16 1235 05/21/16 0400 05/22/16 0500  LATICACIDVEN 1.90  --  1.4 1.1  --   --   --   PROCALCITON  --   < > 0.13  --  1.00 2.30 1.18  < > =  values in this interval not displayed. ABG  Recent Labs Lab 05/20/16 0108  PHART 7.387  PCO2ART 31.9*  PO2ART 64.0*   Liver Enzymes  Recent Labs Lab 05/19/16 1505 05/20/16 0426 05/21/16 0400  AST 228* 128* 73*  ALT 483* 328* 208*  ALKPHOS 105 89 71  BILITOT 0.4 0.3 0.3  ALBUMIN 3.3* 2.6* 2.1*   Cardiac Enzymes  Recent Labs Lab 05/20/16 0423 05/20/16 0805 05/20/16 1235  TROPONINI <0.03 <0.03 <0.03   Glucose  Recent Labs Lab 05/21/16 1140 05/21/16 1525  05/21/16 1927 05/21/16 2326 05/22/16 0318 05/22/16 0725  GLUCAP 115* 110* 128* 106* 93 93   Imaging No results found. STUDIES:  PORT CXR 6/15: Chronic elevation of left hemidiaphragm. No focal opacity or effusion appreciated. CT HEAD W/O 6/15: Mild atrophy. No evidence of acute infarction, hemorrhage, or mass effect. [place] ABD LIMITED 6/15: Multiple small gallstones within the gallbladder. Some tumefactive sludge surrounding one of gallstones. Other gallstones are 3 mm range in size. Some tiny polyps along the gallbladder wall but none more than 3 mm. No overt gallbladder wall thickening. Absent sonographic Murphy sign. PORT CXR 6/16:  No focal opacification but hazy densities and lower lung zones consistent with atelectasis. EEG 6/16 >>   MICROBIOLOGY: Blood Ctx x2 6/15 >> GPC >>  BC Reflex 6/15 >> strep species identified + Urine Ctx 6/15 >> MRSA PCR 6/16 >> U. Strep Antigen 6/16 >> negative  ANTIBIOTICS: Vancomycin 6/15 >> Rocephin 6/15 >>  SIGNIFICANT EVENTS: 3/14 - 4/06 - Admit w/ Strep pneumo meningitis, cardiogenic shock & cardiac arrest. ARDS s/p trach & removal. 4/16 - 5/01 - Readmit after fall w/ AMS.  5/24 - 5/26 - Readmitted for SNF placement 6/15 - Admit from SNF w/ decreased mentation 6/16 - Consult for hypoxia  LINES/TUBES: FOLEY 6/15 >> R DL PICC 9/60 >>  DISCUSSION:  50 year old male with history of Streptococcus pneumoniae meningitis as well as indwelling PICC line. Admitted 6/15 with decreased level of consciousness/activity and witnessed seizure. Hypothermic on arrival. Suspected underlying sepsis with urinary tract infection as likely source. Continuing Keppra IV and consult neurology. Awaiting cultures. Continuing empiric vancomycin & Rocephin with pharmacy to dose. Attempted 6/15 to contact multiple family members today to discuss CODE STATUS but was unsuccessful.  ASSESSMENT / PLAN:  PULMONARY A: Acute Hypoxic Respiratory Failure - Resolved with NT  suctioning. Poor cough reflex and has a nasal trumpet.   P:   Continuous Pulse Ox. Supplemental oxygen to maintain Sat >94%. Monitor closely for airway protection. Duoneb to facilitate mucus clearing. May need bed cpt Monitor closely for airway protection.  NEUROLOGIC A:   Witnessed Seizure - Hasn't received antiepileptic medications. H/O Strep pneumo Meningitis H/O Anoxic Brain Injury - Baseline nonverbal status.  P:   RASS goal: 0 Neurology Consult Keppra IV bid Seizure Precautions Ativan IV prn Serial neuro exams Defer EEG to Neurology  CARDIOVASCULAR A:  Shock - Likely secondary to sepsis. Tachycardia - Sinus on telemetry.  P:  Monitor on telemetry Vitals per unit protocol Phenylephrine to maintain MAP >65 & SBP >90 Trending Troponin   TTE valves ok and EF of 40-45%. Consider TEE to rule out endocarditis if with persistent bacteremia;  await blood cultures  RENAL A:   Hyperkalemia - Mild. Normal renal function. Hyperchloremia  Hypomagnesemia AKI likely 2/2 to sepsis/volume depletion P:   Trending UOP with Foley Repeat electrolytes & renal function w/ AM labs Replace electrolytes as indicated KVO IVF  GASTROINTESTINAL A:   Transaminitis -  Neg RUQ U/S. Protein Calorie Malnutrition  At Risk Aspiration  P:   TF per nutrition. Trend LFTs. Pepcid IV daily  HEMATOLOGIC A:   Thrombocytopenia  P:  Trending cell counts daily w/ CBC SCDs Heparin [place] q8hr  INFECTIOUS A:   Sepsis  H/O Strep pneumo Meningitis UTI  P:   ABX as above  Follow cultures Droplet isolation PCT per algorithm > PCT was 0.13, low grade fever, will continue abx for now. Consider TEE if with persistent bacteremia.  Had negative TEE 03/29/16.  ENDOCRINE A:   At risk for hypoglycemia.  P:   Accu-Checks q4hr KVO IVF now that TF are tolerated.  FAMILY  - Updates: No family at bedside am 6/18.  Need to address code status on Monday when able to get a hold of family.  -  Inter-disciplinary family meet or Palliative Care meeting due by:  6/23  The patient is critically ill with multiple organ systems failure and requires high complexity decision making for assessment and support, frequent evaluation and titration of therapies, application of advanced monitoring technologies and extensive interpretation of multiple databases.   Critical Care Time devoted to patient care services described in this note is  35  Minutes. This time reflects time of care of this signee Dr Koren Bound. This critical care time does not reflect procedure time, or teaching time or supervisory time of PA/NP/Med student/Med Resident etc but could involve care discussion time.  [name], M.D. [facility] Pulmonary/Critical Care Medicine. Pager: [phone]. After hours pager: [phone].

## 2016-05-23 DIAGNOSIS — J9601 Acute respiratory failure with hypoxia: Secondary | ICD-10-CM | POA: Insufficient documentation

## 2016-05-23 LAB — MAGNESIUM: MAGNESIUM: 2.2 mg/dL (ref 1.7–2.4)

## 2016-05-23 LAB — BASIC METABOLIC PANEL
Anion gap: 9 (ref 5–15)
BUN: 18 mg/dL (ref 6–20)
CHLORIDE: 106 mmol/L (ref 101–111)
CO2: 23 mmol/L (ref 22–32)
Calcium: 8.7 mg/dL — ABNORMAL LOW (ref 8.9–10.3)
Creatinine, Ser: 1.22 mg/dL (ref 0.61–1.24)
GFR calc non Af Amer: >60 mL/min (ref >60)
Glucose, Bld: 107 mg/dL — ABNORMAL HIGH (ref 65–99)
POTASSIUM: 5.1 mmol/L (ref 3.5–5.1)
SODIUM: 138 mmol/L (ref 135–145)

## 2016-05-23 LAB — GLUCOSE, CAPILLARY
GLUCOSE-CAPILLARY: 111 mg/dL — AB (ref 65–99)
GLUCOSE-CAPILLARY: 115 mg/dL — AB (ref 65–99)
Glucose-Capillary: 91 mg/dL (ref 65–99)
Glucose-Capillary: 114 mg/dL — ABNORMAL HIGH (ref 65–99)
Glucose-Capillary: 89 mg/dL (ref 65–99)
Glucose-Capillary: 111 mg/dL — ABNORMAL HIGH (ref 65–99)

## 2016-05-23 LAB — CBC
HEMATOCRIT: 26.3 % — AB (ref 39.0–52.0)
HEMOGLOBIN: 8.8 g/dL — AB (ref 13.0–17.0)
MCH: 28.3 pg (ref 26.0–34.0)
MCHC: 33.5 g/dL (ref 30.0–36.0)
MCV: 84.6 fL (ref 78.0–100.0)
Platelets: 67 10*3/uL — ABNORMAL LOW (ref 150–400)
RBC: 3.11 MIL/uL — AB (ref 4.22–5.81)
RDW: 16.4 % — ABNORMAL HIGH (ref 11.5–15.5)
WBC: 9.9 10*3/uL (ref 4.0–10.5)

## 2016-05-23 LAB — PHOSPHORUS: PHOSPHORUS: 6 mg/dL — AB (ref 2.5–4.6)

## 2016-05-23 LAB — VANCOMYCIN, TROUGH: Vancomycin Tr: 29 ug/mL (ref 10.0–20.0)

## 2016-05-23 MED ORDER — VITAMIN B-1 100 MG PO TABS
100.0000 mg | ORAL_TABLET | Freq: Every day | ORAL | Status: DC
  Administered 2016-05-24 – 2016-06-01 (×9): 100 mg
  Filled 2016-05-23 (×9): qty 1

## 2016-05-23 MED ORDER — METOPROLOL TARTRATE 5 MG/5ML IV SOLN: 5.0000 mg | Freq: Four times a day (QID) | INTRAVENOUS | Status: DC | PRN

## 2016-05-23 MED ORDER — DIVALPROEX SODIUM 125 MG PO CSDR
250.0000 mg | DELAYED_RELEASE_CAPSULE | Freq: Three times a day (TID) | ORAL | Status: DC
  Filled 2016-05-23: qty 2

## 2016-05-23 MED ORDER — VANCOMYCIN HCL IN DEXTROSE 750-5 MG/150ML-% IV SOLN: 750.0000 mg | INTRAVENOUS | Status: DC

## 2016-05-23 MED ORDER — VALPROATE SODIUM 250 MG/5ML PO SOLN
250.0000 mg | Freq: Three times a day (TID) | ORAL | Status: DC
  Administered 2016-05-23 – 2016-06-01 (×28): 250 mg
  Filled 2016-05-23 (×30): qty 5

## 2016-05-23 MED ORDER — DEXTROSE 5 % IV SOLN
2.0000 g | INTRAVENOUS | Status: DC
  Administered 2016-05-24 – 2016-05-28 (×5): 2 g via INTRAVENOUS
  Filled 2016-05-23 (×6): qty 2

## 2016-05-23 MED ORDER — FOLIC ACID 1 MG PO TABS
1.0000 mg | ORAL_TABLET | Freq: Every day | ORAL | Status: DC
  Administered 2016-05-24 – 2016-06-01 (×9): 1 mg
  Filled 2016-05-23 (×9): qty 1

## 2016-05-23 NOTE — Progress Notes (Addendum)
PULMONARY / CRITICAL CARE MEDICINE   Name: Dale [name] MRN: [id number] DOB: 07/27/1966    ADMISSION DATE:  05/19/2016 CONSULTATION DATE:  05/20/2016  REFERRING MD:  Lyda PeroneJared Gardner, D.O. / TRH  CHIEF COMPLAINT:  Acute Hypoxic Respiratory Failure  HISTORY OF PRESENT ILLNESS:  History obtained from bedside physician & electronic medical record given altered mentation. Patient has had a complicated past medical course including admission with strep pneumonia meningitis and hospital course complicated by cardiac arrest and cardiogenic shock in March 2016. Patient has had subsequent readmissions until ultimately ended up in a skilled nursing facility. Patient was ultimately transitioned to Rocephin with plans for infectious diseases follow-up before discontinuing. Per electronic medical record on 6/15 the patient had decreased mentation with his baseline nonverbal but normally walking. Patient became nonambulatory. He ultimately had a seizure which prompted transfer to the emergency department. Reportedly they did not give the patient his seizure medications today. In the emergency department the patient was given a load of Depacon and 1 g of IV Keppra. He was noted to be hypothermic as well.    SUBJECTIVE:   No events overnight, more mobile this am.  VITAL SIGNS: BP 105/77 mmHg  Pulse 87  Temp(Src) 98.6 F (37 C) (Oral)  Resp 13  Ht 5\' 8"  (1.727 m)  Wt 58.9 kg (129 lb 13.6 oz)  BMI 19.75 kg/m2  SpO2 100%  HEMODYNAMICS:    VENTILATOR SETTINGS:    INTAKE / OUTPUT: I/O last 3 completed shifts: In: 4018.8 [I.V.:743.8; NG/GT:2305; IV Piggyback:970] Out: 4105 [Urine:4105]  PHYSICAL EXAMINATION: General:  No acute distress. No family at bedside. Arousable but confused, non-verbal, baseline is verbal and self feeding.  Integument:  Warm & dry. No rash on exposed skin. RUE PICC in place. Lymphatics:  No appreciated cervical or supraclavicular lymphadenoapthy. HEENT:  MMM. No scleral  injection or icterus.  Cardiovascular:  Regular rate. No edema. No appreciable JVD. Pulses +1 Pulmonary:  Good aeration & clear to auscultation bilaterally, diminished on L. Normal work of breathing. Abdomen: Soft. BS+, Nondistended.  Musculoskeletal:  Symmetrically decreased muscle bulk. No joint deformity or effusion appreciated. Neurological:  Withdrawal to pain in all 4 extremities. Left gaze deviation that is chronic per prior records. Pupils symmetric.  More mobile this AM but not following command. Psychiatric:  Unable to assess given altered mentation.   LABS:  BMET  Recent Labs Lab 05/21/16 0400 05/22/16 0500 05/23/16 0430  NA 138 139 138  K 3.7 4.3 5.1  CL 111 109 106  CO2 20* 23 23  BUN 18 13 18   CREATININE 1.13 1.14 1.22  GLUCOSE 68 110* 107*   Electrolytes  Recent Labs Lab 05/21/16 0400 05/21/16 1658 05/22/16 0500 05/23/16 0430  CALCIUM 8.4*  --  8.6* 8.7*  MG 1.9 2.4 2.2 2.2  PHOS 2.6 5.1* 4.9* 6.0*    CBC  Recent Labs Lab 05/21/16 0400 05/22/16 0500 05/23/16 0430  WBC 8.6 8.5 9.9  HGB 9.1* 9.1* 8.8*  HCT 27.5* 27.1* 26.3*  PLT 86* 65* 67*   Coag's  Recent Labs Lab 05/19/16 2115  APTT 53*  INR 1.27   Sepsis Markers  Recent Labs Lab 05/19/16 2011  05/19/16 2115 05/20/16 0111 05/20/16 1235 05/21/16 0400 05/22/16 0500  LATICACIDVEN 1.90  --  1.4 1.1  --   --   --   PROCALCITON  --   < > 0.13  --  1.00 2.30 1.18  < > = values in this interval not displayed. ABG  Recent Labs Lab 05/20/16 0108  PHART 7.387  PCO2ART 31.9*  PO2ART 64.0*   Liver Enzymes  Recent Labs Lab 05/19/16 1505 05/20/16 0426 05/21/16 0400  AST 228* 128* 73*  ALT 483* 328* 208*  ALKPHOS 105 89 71  BILITOT 0.4 0.3 0.3  ALBUMIN 3.3* 2.6* 2.1*   Cardiac Enzymes  Recent Labs Lab 05/20/16 0423 05/20/16 0805 05/20/16 1235  TROPONINI <0.03 <0.03 <0.03   Glucose  Recent Labs Lab 05/22/16 1557 05/22/16 1918 05/22/16 2332 05/23/16 0320  05/23/16 0649 05/23/16 1201  GLUCAP 102* 119* 115* 111* 91 114*   Imaging No results found. STUDIES:  PORT CXR 6/15: Chronic elevation of left hemidiaphragm. No focal opacity or effusion appreciated. CT HEAD W/O 6/15: Mild atrophy. No evidence of acute infarction, hemorrhage, or mass effect. [place] ABD LIMITED 6/15: Multiple small gallstones within the gallbladder. Some tumefactive sludge surrounding one of gallstones. Other gallstones are 3 mm range in size. Some tiny polyps along the gallbladder wall but none more than 3 mm. No overt gallbladder wall thickening. Absent sonographic Murphy sign. PORT CXR 6/16:  No focal opacification but hazy densities and lower lung zones consistent with atelectasis. EEG 6/16 >>   MICROBIOLOGY: Blood Ctx x2 6/15 >> GPC >> Strep viridans  BC Reflex 6/15 >> Strep viridans  Urine Ctx 6/15 >> MRSA PCR 6/16 >> U. Strep Antigen 6/16 >> negative  ANTIBIOTICS: Vancomycin 6/15 >>6/19 Rocephin 6/15 >>  SIGNIFICANT EVENTS: 3/14 - 4/06 - Admit w/ Strep pneumo meningitis, cardiogenic shock & cardiac arrest. ARDS s/p trach & removal. 4/16 - 5/01 - Readmit after fall w/ AMS.  5/24 - 5/26 - Readmitted for SNF placement 6/15 - Admit from SNF w/ decreased mentation 6/16 - Consult for hypoxia  LINES/TUBES: FOLEY 6/15 >> R DL PICC 1/61 >>  DISCUSSION:  50 year old male with history of Streptococcus pneumoniae meningitis as well as indwelling PICC line. Admitted 6/15 with decreased level of consciousness/activity and witnessed seizure. Hypothermic on arrival. Suspected underlying sepsis with urinary tract infection as likely source. Continuing Keppra IV and consult neurology. Awaiting cultures. Continuing empiric vancomycin & Rocephin with pharmacy to dose. Attempted 6/15 to contact multiple family members today to discuss CODE STATUS but was unsuccessful.  ASSESSMENT / PLAN:  PULMONARY A: Acute Hypoxic Respiratory Failure - Resolved with NT suctioning. Poor cough  reflex and has a nasal trumpet.   P:   Continuous Pulse Ox. Supplemental oxygen to maintain Sat >94%. Monitor closely for airway protection. Duoneb to facilitate mucus clearing. May need bed cpt Monitor closely for airway protection. Nasal trumpet in place to aid with suction.  NEUROLOGIC A:   Witnessed Seizure - Hasn't received antiepileptic medications. H/O Strep pneumo Meningitis H/O Anoxic Brain Injury - Baseline nonverbal status.  P:   RASS goal: 0 Neurology Consult appreciated, signed off. Keppra IV bid Seizure Precautions Ativan IV prn if seizing  CARDIOVASCULAR A:  Shock - Likely secondary to sepsis. Tachycardia - Sinus on telemetry.  P:  Monitor on telemetry Vitals per unit protocol D/C neo. Trending Troponin   TTE valves ok and EF of 40-45%. Consider TEE to rule out endocarditis if with persistent bacteremia;  Repeat now.  RENAL A:   Hyperkalemia - Mild. Normal renal function. Hyperchloremia  Hypomagnesemia AKI likely 2/2 to sepsis/volume depletion P:   Trending UOP with Foley Repeat electrolytes & renal function w/ AM labs Replace electrolytes as indicated KVO IVF  GASTROINTESTINAL A:   Transaminitis - Neg RUQ U/S. Protein Calorie Malnutrition  At  Risk Aspiration  P:   TF per nutrition. Trend LFTs. Pepcid IV daily  HEMATOLOGIC A:   Thrombocytopenia  P:  Trending cell counts daily w/ CBC SCDs Heparin [place] q8hr  INFECTIOUS A:   Sepsis  H/O Strep pneumo Meningitis UTI  P:   ABX as above  Follow cultures as above. Repeat cultures today, if positive then will need TEE to r/o endocarditis. Consider TEE if with persistent bacteremia.  Had negative TEE 03/29/16.  ENDOCRINE A:   At risk for hypoglycemia.  P:   Accu-Checks q4hr KVO IVF now that TF are tolerated.  FAMILY  - Updates: Spoke with wife over the phone extensively, attempted to address code status, she is insistent on full code status.  - Inter-disciplinary family meet  or Palliative Care meeting due by:  6/23  The patient is critically ill with multiple organ systems failure and requires high complexity decision making for assessment and support, frequent evaluation and titration of therapies, application of advanced monitoring technologies and extensive interpretation of multiple databases.   Critical Care Time devoted to patient care services described in this note is  35  Minutes. This time reflects time of care of this signee Dr Koren Bound. This critical care time does not reflect procedure time, or teaching time or supervisory time of PA/NP/Med student/Med Resident etc but could involve care discussion time.  [name], M.D. [facility] Pulmonary/Critical Care Medicine. Pager: [phone]. After hours pager: [phone].

## 2016-05-23 NOTE — Progress Notes (Signed)
Pharmacy Antibiotic Note  Dale [name] is a 50 y.o. male admitted on 05/19/2016 with likely meningitis. Pt with complicated medical history including strep pneumo meningitis March 2016 - treated with Rocephin.  Pharmacy has been consulted for Rocephin and Vancomycin dosing.  Vancomycin trough 29 mcg/ml (SUPRAtherapeutic) on 750mg  IV q12h. Spoke with RN who stated that 6/19 0600 dose was scanned but was NOT given this morning - she will update the charting to reflect this.  Plan: Continue Rocephin 2gm IV q12h Change Vancomycin to 750mg  IV q24h Will f/u micro data, renal function, and pt's clinical condition Vanc trough prn  Height: 5\' 8"  (172.7 cm) Weight: 129 lb 13.6 oz (58.9 kg) IBW/kg (Calculated) : 68.4  Temp (24hrs), Avg:99.3 F (37.4 C), Min:97.9 F (36.6 C), Max:100.6 F (38.1 C)   Recent Labs Lab 05/19/16 1525 05/19/16 2011 05/19/16 2115 05/20/16 0111 05/20/16 0426 05/21/16 0400 05/22/16 0500 05/23/16 0430  WBC  --   --   --  6.8 10.8* 8.6 8.5 9.9  CREATININE  --   --   --  0.90 1.25* 1.13 1.14 1.22  LATICACIDVEN 0.87 1.90 1.4 1.1  --   --   --   --   VANCOTROUGH  --   --   --   --   --   --   --  29*    Estimated Creatinine Clearance: 59.7 mL/min (by C-G formula based on Cr of 1.22).    No Known Allergies  Antimicrobials this admission: 6/15 Vanc >>  6/15 Rocephin >>   Dose adjustments this admission: VT 29 mcg/ml (SUPRAtherapeutic) on 750mg  IV q12h. Spoke with RN who stated that 6/19 0600 dose was scanned but was NOT given this morning - she will update the charting to reflect this >> will change dose to 750mg  IV q24h  Microbiology results: 6/15 BCx x2: 1/2 strep viridans 6/15 UCx:  Insignificant growth 6/17 cdiff pcr: positive (colonization)  Thank you for allowing pharmacy to be a part of this patient's care.  Christoper Fabianaron Emmarose Klinke, PharmD, BCPS Clinical pharmacist, pager [phone] 05/23/2016 6:06 AM

## 2016-05-23 NOTE — Progress Notes (Signed)
Pharmacy Antibiotic Note  Dale [name] is a 50 y.o. male admitted on 05/19/2016 with Viridans Strep bacteremia.  Pharmacy has been consulted for Rocephin and Vancomycin dosing.  Discussed patient with Dr. Molli KnockYacoub- reducing Rocephin as does not feel this is meningitis. Will keep 2g IV q24h for bacteremia. Vancomycin will be discontinued.   Plan: After discussion with Dr. Molli KnockYacoub, antibiotics will be changed Rocephin alone at standard dosing for bacteremia. .  Repeating blood cultures.   Height: 5\' 8"  (172.7 cm) Weight: 129 lb 13.6 oz (58.9 kg) IBW/kg (Calculated) : 68.4  Temp (24hrs), Avg:99 F (37.2 C), Min:97.9 F (36.6 C), Max:100.2 F (37.9 C)   Recent Labs Lab 05/19/16 1525 05/19/16 2011 05/19/16 2115 05/20/16 0111 05/20/16 0426 05/21/16 0400 05/22/16 0500 05/23/16 0430  WBC  --   --   --  6.8 10.8* 8.6 8.5 9.9  CREATININE  --   --   --  0.90 1.25* 1.13 1.14 1.22  LATICACIDVEN 0.87 1.90 1.4 1.1  --   --   --   --   VANCOTROUGH  --   --   --   --   --   --   --  29*    Estimated Creatinine Clearance: 59.7 mL/min (by C-G formula based on Cr of 1.22).    No Known Allergies  Antimicrobials this admission: 6/15 Vanc >> 6/19 6/15 Rocephin >>   Dose adjustments this admission: 6/19 VT 29 on 750mg  IV q12h >> reduced to q24h 6/19 Reduced CTX from 2g IV q12 to q24h - do NOT think meningitis  Microbiology results: 6/15 BCx x2 - Viridans strep (1 of 2) 6/15 UCx: negative 6/17 C.diff - colonization  Thank you for allowing pharmacy to be a part of this patient's care.  Link SnufferJessica Mickey Hebel, PharmD, BCPS Clinical Pharmacist [phone] 05/23/2016 12:58 PM

## 2016-05-23 NOTE — Progress Notes (Signed)
Patient is from [facility]. Per family, plan is for Pt to return to [facility] once medically stable and cleared for discharge. FL2 completed. CSW  Will continue to follow and facilitate patient discharge once medically stable.        Lance MussAshley Gardner,MSW, LCSW [facility] ED/25M Clinical Social Worker [phone]

## 2016-05-23 NOTE — Progress Notes (Signed)
Subjective: No acute events overnight  Exam: Filed Vitals:   05/23/16 0722 05/23/16 0800  BP:  107/61  Pulse:  103  Temp: 98.6 F (37 C)   Resp:  15   Gen: In bed, NAD Resp: non-labored breathing, no acute distress Abd: soft, nt  Neuro: MS: Awake, fixates and tracks.  CN: PERRL, tracks across midline in both directions. Blinks to threat consistently from the right, not from the left Motor: Moves right side more than left, but does move both spontaneously.  Sensory: responds to nox stim x 4.   Pertinent Labs: Bmp - unremarkable  Impression: 50 yo M with seizures in the setting of possibly missing his AEDs(per H&P) and ? urosepsis. Now improved with treatment. With explanation for breakthrough, and no seizures now on increased dose, would continue current therapy.   Recommendations: 1) Cotninue keppra 1gm BID(home 500mg  BID) 2) restart home depakote 250mg  TID.  3) No further inpatient recommendations at this time. Please call if patient has any further seizures.    Ritta SlotMcNeill Kalysta Kneisley, MD [facility] [phone]  If 7pm- 7am, please page neurology on call as listed in AMION.

## 2016-05-24 ENCOUNTER — Inpatient Hospital Stay (HOSPITAL_COMMUNITY): Payer: Medicaid Other

## 2016-05-24 LAB — BASIC METABOLIC PANEL
Anion gap: 9 (ref 5–15)
BUN: 19 mg/dL (ref 6–20)
CALCIUM: 9 mg/dL (ref 8.9–10.3)
CHLORIDE: 105 mmol/L (ref 101–111)
CO2: 26 mmol/L (ref 22–32)
CREATININE: 0.94 mg/dL (ref 0.61–1.24)
GFR calc non Af Amer: >60 mL/min (ref >60)
Glucose, Bld: 117 mg/dL — ABNORMAL HIGH (ref 65–99)
Potassium: 4.6 mmol/L (ref 3.5–5.1)
SODIUM: 140 mmol/L (ref 135–145)

## 2016-05-24 LAB — CBC
HCT: 26 % — ABNORMAL LOW (ref 39.0–52.0)
HEMOGLOBIN: 8.5 g/dL — AB (ref 13.0–17.0)
MCH: 28.5 pg (ref 26.0–34.0)
MCHC: 32.7 g/dL (ref 30.0–36.0)
MCV: 87.2 fL (ref 78.0–100.0)
Platelets: 88 10*3/uL — ABNORMAL LOW (ref 150–400)
RBC: 2.98 MIL/uL — ABNORMAL LOW (ref 4.22–5.81)
RDW: 16.7 % — AB (ref 11.5–15.5)
WBC: 12.6 10*3/uL — AB (ref 4.0–10.5)

## 2016-05-24 LAB — GLUCOSE, CAPILLARY
Glucose-Capillary: 100 mg/dL — ABNORMAL HIGH (ref 65–99)
Glucose-Capillary: 102 mg/dL — ABNORMAL HIGH (ref 65–99)
Glucose-Capillary: 111 mg/dL — ABNORMAL HIGH (ref 65–99)
Glucose-Capillary: 115 mg/dL — ABNORMAL HIGH (ref 65–99)
Glucose-Capillary: 95 mg/dL (ref 65–99)
Glucose-Capillary: 95 mg/dL (ref 65–99)
Glucose-Capillary: 98 mg/dL (ref 65–99)
Glucose-Capillary: 98 mg/dL (ref 65–99)

## 2016-05-24 LAB — CULTURE, BLOOD (ROUTINE X 2): Culture: NO GROWTH

## 2016-05-24 LAB — MAGNESIUM: MAGNESIUM: 2.2 mg/dL (ref 1.7–2.4)

## 2016-05-24 LAB — PHOSPHORUS: PHOSPHORUS: 5.6 mg/dL — AB (ref 2.5–4.6)

## 2016-05-24 MED ORDER — LEVETIRACETAM 100 MG/ML PO SOLN
1000.0000 mg | Freq: Two times a day (BID) | ORAL | Status: DC
  Administered 2016-05-24 – 2016-06-01 (×17): 1000 mg
  Filled 2016-05-24 (×18): qty 10

## 2016-05-24 MED ORDER — ATORVASTATIN CALCIUM 20 MG PO TABS
20.0000 mg | ORAL_TABLET | Freq: Every day | ORAL | Status: DC
  Administered 2016-05-24 – 2016-06-01 (×9): 20 mg
  Filled 2016-05-24 (×9): qty 1

## 2016-05-24 NOTE — Progress Notes (Addendum)
PULMONARY / CRITICAL CARE MEDICINE   Name: Dale HollowCraig D Radabaugh MRN: [id number] DOB: 04/13/1966    ADMISSION DATE:  05/19/2016 CONSULTATION DATE:  05/20/2016  REFERRING MD:  Lyda PeroneJared Gardner, D.O. / TRH  CHIEF COMPLAINT:  Acute Hypoxic Respiratory Failure  HISTORY OF PRESENT ILLNESS:  History obtained from bedside physician & electronic medical record given altered mentation. Patient has had a complicated past medical course including admission with strep pneumonia meningitis and hospital course complicated by cardiac arrest and cardiogenic shock in March 2016. Patient has had subsequent readmissions until ultimately ended up in a skilled nursing facility. Patient was ultimately transitioned to Rocephin with plans for infectious diseases follow-up before discontinuing. Per electronic medical record on 6/15 the patient had decreased mentation with his baseline nonverbal but normally walking. Patient became nonambulatory. He ultimately had a seizure which prompted transfer to the emergency department. Reportedly they did not give the patient his seizure medications today. In the emergency department the patient was given a load of Depacon and 1 g of IV Keppra. He was noted to be hypothermic as well.    SUBJECTIVE:   No events overnight, tracks in the room this AM.  VITAL SIGNS: BP 91/69 mmHg  Pulse 89  Temp(Src) 97.5 F (36.4 C) (Oral)  Resp 10  Ht 5\' 8"  (1.727 m)  Wt 57.4 kg (126 lb 8.7 oz)  BMI 19.25 kg/m2  SpO2 100%  HEMODYNAMICS:    VENTILATOR SETTINGS:    INTAKE / OUTPUT: I/O last 3 completed shifts: In: 3381.4 [I.V.:141.4; Other:120; NG/GT:2490; IV Piggyback:630] Out: 3125 [Urine:3125]  PHYSICAL EXAMINATION: General:  No acute distress. No family at bedside. Arousable but confused, non-verbal, baseline is verbal and self feeding.  Integument:  Warm & dry. No rash on exposed skin. RUE PICC in place. Lymphatics:  No appreciated cervical or supraclavicular lymphadenoapthy. HEENT:   MMM. No scleral injection or icterus.  Cardiovascular:  Regular rate. No edema. No appreciable JVD. Pulses +1 Pulmonary:  Good aeration & clear to auscultation bilaterally, diminished on L. Normal work of breathing. Abdomen: Soft. BS+, Nondistended.  Musculoskeletal:  Symmetrically decreased muscle bulk. No joint deformity or effusion appreciated. Neurological:  Withdrawal to pain in all 4 extremities. Left gaze deviation that is chronic per prior records. Pupils symmetric.  More mobile this AM but not following command. Psychiatric:  Unable to assess given altered mentation.   LABS:  BMET  Recent Labs Lab 05/22/16 0500 05/23/16 0430 05/24/16 0525  NA 139 138 140  K 4.3 5.1 4.6  CL 109 106 105  CO2 23 23 26   BUN 13 18 19   CREATININE 1.14 1.22 0.94  GLUCOSE 110* 107* 117*   Electrolytes  Recent Labs Lab 05/22/16 0500 05/23/16 0430 05/24/16 0525  CALCIUM 8.6* 8.7* 9.0  MG 2.2 2.2 2.2  PHOS 4.9* 6.0* 5.6*    CBC  Recent Labs Lab 05/22/16 0500 05/23/16 0430 05/24/16 0525  WBC 8.5 9.9 12.6*  HGB 9.1* 8.8* 8.5*  HCT 27.1* 26.3* 26.0*  PLT 65* 67* 88*   Coag's  Recent Labs Lab 05/19/16 2115  APTT 53*  INR 1.27   Sepsis Markers  Recent Labs Lab 05/19/16 2011  05/19/16 2115 05/20/16 0111 05/20/16 1235 05/21/16 0400 05/22/16 0500  LATICACIDVEN 1.90  --  1.4 1.1  --   --   --   PROCALCITON  --   < > 0.13  --  1.00 2.30 1.18  < > = values in this interval not displayed. ABG  Recent Labs Lab  05/20/16 0108  PHART 7.387  PCO2ART 31.9*  PO2ART 64.0*   Liver Enzymes  Recent Labs Lab 05/19/16 1505 05/20/16 0426 05/21/16 0400  AST 228* 128* 73*  ALT 483* 328* 208*  ALKPHOS 105 89 71  BILITOT 0.4 0.3 0.3  ALBUMIN 3.3* 2.6* 2.1*   Cardiac Enzymes  Recent Labs Lab 05/20/16 0423 05/20/16 0805 05/20/16 1235  TROPONINI <0.03 <0.03 <0.03   Glucose  Recent Labs Lab 05/23/16 0649 05/23/16 1201 05/23/16 1603 05/23/16 2048 05/24/16 0034  05/24/16 0421  GLUCAP 91 114* 89 111* 111* 115*   Imaging Dg Chest Port 1 View  05/24/2016  CLINICAL DATA:  Respiratory failure. EXAM: PORTABLE CHEST 1 VIEW COMPARISON:  05/21/2016. FINDINGS: Feeding tube and right PICC line stable position. Bibasilar atelectasis. Left lower lobe infiltrate with slight interim improvement. No pleural effusion or pneumothorax. IMPRESSION: 1.  Lines and tubes stable position. 2. Low lung volumes with bibasilar atelectasis. Left lower lobe infiltrate with slight improvement from prior exam . Electronically Signed   By: Maisie Fus  Register   On: 05/24/2016 06:56   STUDIES:  PORT CXR 6/15: Chronic elevation of left hemidiaphragm. No focal opacity or effusion appreciated. CT HEAD W/O 6/15: Mild atrophy. No evidence of acute infarction, hemorrhage, or mass effect. [place] ABD LIMITED 6/15: Multiple small gallstones within the gallbladder. Some tumefactive sludge surrounding one of gallstones. Other gallstones are 3 mm range in size. Some tiny polyps along the gallbladder wall but none more than 3 mm. No overt gallbladder wall thickening. Absent sonographic Murphy sign. PORT CXR 6/16:  No focal opacification but hazy densities and lower lung zones consistent with atelectasis. EEG 6/16 >>   MICROBIOLOGY: Blood Ctx x2 6/15 >> GPC >> Strep viridans  BC Reflex 6/15 >> Strep viridans  Urine Ctx 6/15 >> MRSA PCR 6/16 >> U. Strep Antigen 6/16 >> negative  ANTIBIOTICS: Vancomycin 6/15 >>6/19 Rocephin 6/15 >>  SIGNIFICANT EVENTS: 3/14 - 4/06 - Admit w/ Strep pneumo meningitis, cardiogenic shock & cardiac arrest. ARDS s/p trach & removal. 4/16 - 5/01 - Readmit after fall w/ AMS.  5/24 - 5/26 - Readmitted for SNF placement 6/15 - Admit from SNF w/ decreased mentation 6/16 - Consult for hypoxia  LINES/TUBES: FOLEY 6/15 >> R DL PICC 1/61 >>  I reviewed CXR myself, bibasilar atelectasis noted.  DISCUSSION:  50 year old male with history of Streptococcus pneumoniae  meningitis as well as indwelling PICC line. Admitted 6/15 with decreased level of consciousness/activity and witnessed seizure. Hypothermic on arrival. Suspected underlying sepsis with urinary tract infection as likely source. Continuing Keppra IV and consult neurology. Awaiting cultures. Continuing empiric vancomycin & Rocephin with pharmacy to dose. Attempted 6/15 to contact multiple family members today to discuss CODE STATUS but was unsuccessful.  ASSESSMENT / PLAN:  PULMONARY A: Acute Hypoxic Respiratory Failure - Resolved with NT suctioning. Poor cough reflex and has a nasal trumpet.   P:   Continuous Pulse Ox. Supplemental oxygen to maintain Sat >94%. Monitor closely for airway protection. Duoneb to facilitate mucus clearing. May need bed cpt Monitor closely for airway protection. D/C nasal trumpet.   NEUROLOGIC A:   Witnessed Seizure - Hasn't received antiepileptic medications. H/O Strep pneumo Meningitis H/O Anoxic Brain Injury - Baseline nonverbal status.  P:   RASS goal: 0 Neurology Consult appreciated, signed off. Keppra IV bid Seizure Precautions Ativan IV prn if seizing  CARDIOVASCULAR A:  Shock - Likely secondary to sepsis. Tachycardia - Sinus on telemetry.  P:  Monitor on telemetry Vitals per  unit protocol D/C neo. TTE valves ok and EF of 40-45%. Consider TEE to rule out endocarditis if with persistent bacteremia;  Repeated cultures 6/19, if clean then no need for TEE.  RENAL A:   Hyperkalemia - Mild. Normal renal function. Hyperchloremia  Hypomagnesemia AKI likely 2/2 to sepsis/volume depletion P:   Trending UOP with Foley Repeat electrolytes & renal function w/ AM labs Replace electrolytes as indicated KVO IVF  GASTROINTESTINAL A:   Transaminitis - Neg RUQ U/S. Protein Calorie Malnutrition  At Risk Aspiration  P:   TF per nutrition. Trend LFTs. Pepcid IV daily. Start atorvostatin 20 mg PO daily.  HEMATOLOGIC A:    Thrombocytopenia  P:  Trending cell counts daily w/ CBC SCDs Heparin [place] q8hr  INFECTIOUS A:   Sepsis  H/O Strep pneumo Meningitis UTI  P:   ABX as above  Follow cultures as above. Repeat cultures today, if positive then will need TEE to r/o endocarditis. Consider TEE if with persistent bacteremia.  Had negative TEE 03/29/16.  ENDOCRINE A:   At risk for hypoglycemia.  P:   Accu-Checks q4hr KVO IVF now that TF are tolerated.  FAMILY  - Updates: Spoke with wife over the phone extensively, attempted to address code status, she is insistent on full code status.  May want to consider involvement of palliative care if no improvement by the end of the week.  Wife will be difficult to convince.  - Inter-disciplinary family meet or Palliative Care meeting due by:  6/23  Discussed with bedside RN and TRH-MD.  [name], M.D. [facility] Pulmonary/Critical Care Medicine. Pager: [phone]. After hours pager: [phone].

## 2016-05-24 NOTE — Evaluation (Signed)
Physical Therapy Evaluation Patient Details Name: Dale [name] MRN: [id number] DOB: 08/23/1966 Today's Date: 05/24/2016   History of Present Illness  Pt adm with acute respiratory failure and seizure. PMH - pneumonia meningitis and hospital course complicated by cardiac arrest and cardiogenic shock in March 2016, multiple readmissions since then, HTN  Clinical Impression  Pt admitted with above diagnosis and presents to PT with functional limitations due to deficits listed below (See PT problem list). Pt needs skilled PT to maximize independence and safety to allow discharge to back to SNF. Currently pt's mobility is much less than     Follow Up Recommendations SNF    Equipment Recommendations  Other (comment) (to be determined in next venue)    Recommendations for Other Services       Precautions / Restrictions Precautions Precautions: Fall      Mobility  Bed Mobility Overal bed mobility: Needs Assistance Bed Mobility: Supine to Sit;Sit to Supine     Supine to sit: +2 for physical assistance;Total assist Sit to supine: +2 for physical assistance;Total assist   General bed mobility comments: Assist for all aspects  Transfers                    Ambulation/Gait                Stairs            Wheelchair Mobility    Modified Rankin (Stroke Patients Only)       Balance Overall balance assessment: Needs assistance Sitting-balance support: Feet supported;No upper extremity supported Sitting balance-Leahy Scale: Zero Sitting balance - Comments: max A to maintain sitting EOB. No balance reactions                                     Pertinent Vitals/Pain Pain Assessment: Faces Faces Pain Scale: No hurt    Home Living Family/patient expects to be discharged to:: Skilled nursing facility                      Prior Function Level of Independence: Needs assistance   Gait / Transfers Assistance Needed: Per chart and old  notes pt was ambulatory           Hand Dominance        Extremity/Trunk Assessment   Upper Extremity Assessment: LUE deficits/detail;RUE deficits/detail RUE Deficits / Details: active movement against gravity noted.     LUE Deficits / Details: No active movement noted   Lower Extremity Assessment: RLE deficits/detail;LLE deficits/detail RLE Deficits / Details: No active movement noted LLE Deficits / Details: No active movement noted     Communication   Communication: Expressive difficulties;Receptive difficulties (Pt mumbling but not intelligible)  Cognition Arousal/Alertness: Awake/alert Behavior During Therapy: Flat affect Overall Cognitive Status: Impaired/Different from baseline                 General Comments: Following some 1 step commands with RUE    General Comments      Exercises        Assessment/Plan    PT Assessment Patient needs continued PT services  PT Diagnosis Difficulty walking;Generalized weakness;Altered mental status   PT Problem List Decreased strength;Decreased activity tolerance;Decreased balance;Decreased mobility;Decreased cognition  PT Treatment Interventions DME instruction;Gait training;Functional mobility training;Therapeutic activities;Therapeutic exercise;Balance training;Cognitive remediation;Patient/family education   PT Goals (Current goals can be found in the Care Plan section) Acute  Rehab PT Goals Patient Stated Goal: Pt unable to state PT Goal Formulation: Patient unable to participate in goal setting Time For Goal Achievement: 06/07/16 Potential to Achieve Goals: Fair    Frequency Min 2X/week   Barriers to discharge        Co-evaluation               End of Session   Activity Tolerance: Patient tolerated treatment well Patient left: in bed;with call bell/phone within reach Nurse Communication: Mobility status         Time: 1144-1200 PT Time Calculation (min) (ACUTE ONLY): 16 min   Charges:    PT Evaluation $PT Eval Moderate Complexity: 1 Procedure     PT G Codes:        Dale [name] 2016-06-13, 1:06 PM  [employer] PT [phone]

## 2016-05-24 NOTE — Progress Notes (Signed)
Unable to perform CPT; patient transferred to unit and RNs at bedside of patient at scheduled CPT time. RT will continue to monitor patient.

## 2016-05-24 NOTE — Progress Notes (Signed)
Pt received to 2C-8 from 49M. Pt drowsy but HDS and no s/s of resp distress. [place]

## 2016-05-25 ENCOUNTER — Inpatient Hospital Stay (HOSPITAL_COMMUNITY): Payer: Medicaid Other

## 2016-05-25 LAB — GLUCOSE, CAPILLARY
GLUCOSE-CAPILLARY: 88 mg/dL (ref 65–99)
GLUCOSE-CAPILLARY: 97 mg/dL (ref 65–99)
Glucose-Capillary: 100 mg/dL — ABNORMAL HIGH (ref 65–99)
Glucose-Capillary: 101 mg/dL — ABNORMAL HIGH (ref 65–99)
Glucose-Capillary: 103 mg/dL — ABNORMAL HIGH (ref 65–99)
Glucose-Capillary: 114 mg/dL — ABNORMAL HIGH (ref 65–99)

## 2016-05-25 LAB — BASIC METABOLIC PANEL
Anion gap: 8 (ref 5–15)
BUN: 20 mg/dL (ref 6–20)
CO2: 29 mmol/L (ref 22–32)
CREATININE: 0.91 mg/dL (ref 0.61–1.24)
Calcium: 9 mg/dL (ref 8.9–10.3)
Chloride: 103 mmol/L (ref 101–111)
GFR calc Af Amer: >60 mL/min (ref >60)
GLUCOSE: 87 mg/dL (ref 65–99)
POTASSIUM: 5 mmol/L (ref 3.5–5.1)
Sodium: 140 mmol/L (ref 135–145)

## 2016-05-25 LAB — CBC
HCT: 25.9 % — ABNORMAL LOW (ref 39.0–52.0)
Hemoglobin: 8.5 g/dL — ABNORMAL LOW (ref 13.0–17.0)
MCH: 28.2 pg (ref 26.0–34.0)
MCHC: 32.8 g/dL (ref 30.0–36.0)
MCV: 86 fL (ref 78.0–100.0)
PLATELETS: 94 10*3/uL — AB (ref 150–400)
RBC: 3.01 MIL/uL — AB (ref 4.22–5.81)
RDW: 16.5 % — ABNORMAL HIGH (ref 11.5–15.5)
WBC: 10.2 10*3/uL (ref 4.0–10.5)

## 2016-05-25 LAB — URINALYSIS, ROUTINE W REFLEX MICROSCOPIC
Bilirubin Urine: NEGATIVE
GLUCOSE, UA: NEGATIVE mg/dL
Ketones, ur: 15 mg/dL — AB
NITRITE: NEGATIVE
PH: 6.5 (ref 5.0–8.0)
Protein, ur: 100 mg/dL — AB
SPECIFIC GRAVITY, URINE: 1.017 (ref 1.005–1.030)

## 2016-05-25 LAB — PHOSPHORUS: Phosphorus: 6.5 mg/dL — ABNORMAL HIGH (ref 2.5–4.6)

## 2016-05-25 LAB — URINE MICROSCOPIC-ADD ON

## 2016-05-25 LAB — MAGNESIUM: Magnesium: 2.2 mg/dL (ref 1.7–2.4)

## 2016-05-25 NOTE — Progress Notes (Signed)
eLink Physician-Brief Progress Note Patient Name: Dale [name] DOB: 08/19/1966 MRN: [id number]   Date of Service  05/25/2016  HPI/Events of Note  Hematuria noted.  eICU Interventions  Will hold SQ heparin and send u/a.     Intervention Category Major Interventions: Other:  Kenzo Ozment 05/25/2016, 1:56 AM

## 2016-05-25 NOTE — Progress Notes (Signed)
ELink MD made aware that patients urine is now red. New orders received to hold heparin and send UA. Will continue to monitor patient closely.

## 2016-05-25 NOTE — Progress Notes (Signed)
PROGRESS NOTE    Bonnita HollowCraig D Jumper  ZOX:[id number] DOB: 01/20/1966 DOA: 05/19/2016 PCP: No primary care provider on file.    Brief Narrative:    50 year old male with h/o strep pneumonia meningitis, dysphagia, dementia and hospital course complicated by cardiac arrest and cardiogenic shock in March 2016, ARF, shock liver, adrenal insufficiency presents with acute encephalopathy.   Recent Events in the last 3 months;  3/14-4/6: Admitted with strep pneumo meningitis, c/b cardiac arrest, and cardiogenic shock (EF 20%, recovered to 50%) briefly on amiodarone, ARDS requiring prolonged intubation and trach (now removed), ARF, shock liver, and adrenal insufficiency. He was discharged on 4/6 with weekly Pen G IM until 4/10  4/16-5/1: Readmitted with fall, altered mental status. Repeat LP had no growth, TEE without vegetation, ultimately transitioned to ceftriaxone 2g BID with plans for >4 weeks IV antibiotics. During this hospitalization, he was planning for SNF placement until the day of discharge when the patient's brother asked to take him home (it appears that this wasn't verified with the patient's estranged but legally married wife).  Since discharge, patient has returned to the ED 4 times because of loss of PICC line, PICC requiring to be flushed, PICC coming out, or patient wandering unattended in the street (he has dementia since his meningitis).  5/24-5/26: Readmitted for placement to SNF, given additional week of rocephin and was supposed to follow up with ID before stopping rocephin.  On 6/15 patient came from NH, due to acute encephalopathy, seizure, .   Multiple readmissions to the hospital, patient became non verbal, non ambulatory, encephalopathic, patient ended up in SNF.   Assessment & Plan:   Principal Problem:   Septic shock (HCC) Active Problems:   Acute encephalopathy   Meningitis, pneumococcal, recurrent   Cerebral septic emboli (HCC)   Dementia   Convulsions/seizures  (HCC)   Dysphagia   Hypothermia   Acute respiratory failure with hypoxemia (HCC)   Acute Respiratory Failure : Resolved not requiring oxygen.  Duuonebs, NT suctioning as needed.   Dysphagia: on NG tube feedings    Recurrently streptococcal meningitis/ seizures Resume 1000 mg bid, IV ativan,. Resume rocephin .     Septic shock: Recurrent meningitis/ blood cultures pending .    Hyperkalemia: resolved.   Acute Kidney Injury: - probably from sepsis and dehydration.  - improved.    Protein calorie malnutrition: Dietary recommendations.     DVT prophylaxis: SCD's Code Status: FULL CODE Family Communication: none at bedside.  Disposition Plan: pending further management.    Consultants:   PCCM  Neurology    Procedures:  Blood Ctx x2 6/15 >> GPC >> Strep viridans  BC Reflex 6/15 >> Strep viridans  Urine Ctx 6/15 >> MRSA PCR 6/16 >> U. Strep Antigen 6/16 >> negative   Antimicrobials:  Vancomycin 6/15 >>6/19 Rocephin 6/15 >>  SIGNIFICANT EVENTS: 3/14 - 4/06 - Admit w/ Strep pneumo meningitis, cardiogenic shock & cardiac arrest. ARDS s/p trach & removal. 4/16 - 5/01 - Readmit after fall w/ AMS.  5/24 - 5/26 - Readmitted for SNF placement 6/15 - Admit from SNF w/ decreased mentation   LINES/TUBES: FOLEY 6/15 >> R DL PICC 8/115/23 >>  Subjective: Non verbal. Appears comfortable.   Objective: Filed Vitals:   05/25/16 0941 05/25/16 1129 05/25/16 1219 05/25/16 1510  BP:   108/73   Pulse:   99   Temp:   98.7 F (37.1 C)   TempSrc:   Axillary   Resp:   13   Height:  Weight:      SpO2: 98% 96% 98% 98%    Intake/Output Summary (Last 24 hours) at 05/25/16 1545 Last data filed at 05/25/16 1500  Gross per 24 hour  Intake 1584.42 ml  Output   2625 ml  Net -1040.58 ml   Filed Weights   05/24/16 0500 05/24/16 1527 05/25/16 0500  Weight: 57.4 kg (126 lb 8.7 oz) 56.7 kg (125 lb) 54.4 kg (119 lb 14.9 oz)    Examination:  General exam:  Appears calm and comfortable  Respiratory system: Clear to auscultation. Respiratory effort normal. Cardiovascular system: S1 & S2 heard, RRR. No JVD, murmurs, rubs, gallops or clicks. No pedal edema. Gastrointestinal system: Abdomen is nondistended, soft and nontender. No organomegaly or masses felt. Normal bowel sounds heard. Central nervous system: Alert and oriented. No focal neurological deficits. Extremities: Symmetric 5 x 5 power. Skin: No rashes, lesions or ulcers Psychiatry: Judgement and insight appear normal. Mood & affect appropriate.     Data Reviewed: I have personally reviewed following labs and imaging studies  CBC:  Recent Labs Lab 05/19/16 1505  05/21/16 0400 05/22/16 0500 05/23/16 0430 05/24/16 0525 05/25/16 0319  WBC 8.8  < > 8.6 8.5 9.9 12.6* 10.2  NEUTROABS 7.1  --   --   --   --   --   --   HGB 13.0  < > 9.1* 9.1* 8.8* 8.5* 8.5*  HCT 38.4*  < > 27.5* 27.1* 26.3* 26.0* 25.9*  MCV 85.0  < > 85.1 83.9 84.6 87.2 86.0  PLT 133*  < > 86* 65* 67* 88* 94*  < > = values in this interval not displayed. Basic Metabolic Panel:  Recent Labs Lab 05/21/16 0400 05/21/16 1658 05/22/16 0500 05/23/16 0430 05/24/16 0525 05/25/16 0319  NA 138  --  139 138 140 140  K 3.7  --  4.3 5.1 4.6 5.0  CL 111  --  109 106 105 103  CO2 20*  --  23 23 26 29   GLUCOSE 68  --  110* 107* 117* 87  BUN 18  --  13 18 19 20   CREATININE 1.13  --  1.14 1.22 0.94 0.91  CALCIUM 8.4*  --  8.6* 8.7* 9.0 9.0  MG 1.9 2.4 2.2 2.2 2.2 2.2  PHOS 2.6 5.1* 4.9* 6.0* 5.6* 6.5*   GFR: Estimated Creatinine Clearance: 73.9 mL/min (by C-G formula based on Cr of 0.91). Liver Function Tests:  Recent Labs Lab 05/19/16 1505 05/20/16 0426 05/21/16 0400  AST 228* 128* 73*  ALT 483* 328* 208*  ALKPHOS 105 89 71  BILITOT 0.4 0.3 0.3  PROT 6.9 5.6* 4.8*  ALBUMIN 3.3* 2.6* 2.1*    Recent Labs Lab 05/19/16 1505  LIPASE 18   No results for input(s): AMMONIA in the last 168 hours. Coagulation  Profile:  Recent Labs Lab 05/19/16 2115  INR 1.27   Cardiac Enzymes:  Recent Labs Lab 05/20/16 0423 05/20/16 0805 05/20/16 1235  TROPONINI <0.03 <0.03 <0.03   BNP (last 3 results) No results for input(s): PROBNP in the last 8760 hours. HbA1C: No results for input(s): HGBA1C in the last 72 hours. CBG:  Recent Labs Lab 05/24/16 1950 05/24/16 2351 05/25/16 0352 05/25/16 0821 05/25/16 1201  GLUCAP 98 95 88 97 114*   Lipid Profile: No results for input(s): CHOL, HDL, LDLCALC, TRIG, CHOLHDL, LDLDIRECT in the last 72 hours. Thyroid Function Tests: No results for input(s): TSH, T4TOTAL, FREET4, T3FREE, THYROIDAB in the last 72 hours. Anemia Panel: No  results for input(s): VITAMINB12, FOLATE, FERRITIN, TIBC, IRON, RETICCTPCT in the last 72 hours. Sepsis Labs:  Recent Labs Lab 05/19/16 1525 05/19/16 2011 05/19/16 2115 05/20/16 0111 05/20/16 1235 05/21/16 0400 05/22/16 0500  PROCALCITON  --   --  0.13  --  1.00 2.30 1.18  LATICACIDVEN 0.87 1.90 1.4 1.1  --   --   --     Recent Results (from the past 240 hour(s))  Blood Culture (routine x 2)     Status: None   Collection Time: 05/19/16  3:05 PM  Result Value Ref Range Status   Specimen Description BLOOD RIGHT PICC LINE  Final   Special Requests BOTTLES DRAWN AEROBIC AND ANAEROBIC 5CC  Final   Culture NO GROWTH 5 DAYS  Final   Report Status 05/24/2016 FINAL  Final  Blood Culture (routine x 2)     Status: Abnormal   Collection Time: 05/19/16  3:18 PM  Result Value Ref Range Status   Specimen Description BLOOD LEFT HAND  Final   Special Requests BOTTLES DRAWN AEROBIC AND ANAEROBIC 5CC  Final   Culture  Setup Time   Final    GRAM POSITIVE COCCI IN CHAINS AEROBIC BOTTLE ONLY CRITICAL RESULT CALLED TO, READ BACK BY AND VERIFIED WITH: M MACCIA 05/20/16 @ 1009 M VESTAL    Culture (A)  Final    VIRIDANS STREPTOCOCCUS THE SIGNIFICANCE OF ISOLATING THIS ORGANISM FROM A SINGLE SET OF BLOOD CULTURES WHEN MULTIPLE SETS  ARE DRAWN IS UNCERTAIN. PLEASE NOTIFY THE MICROBIOLOGY DEPARTMENT WITHIN ONE WEEK IF SPECIATION AND SENSITIVITIES ARE REQUIRED.    Report Status 05/22/2016 FINAL  Final  Blood Culture ID Panel (Reflexed)     Status: Abnormal   Collection Time: 05/19/16  3:18 PM  Result Value Ref Range Status   Enterococcus species NOT DETECTED NOT DETECTED Final   Vancomycin resistance NOT DETECTED NOT DETECTED Final   Listeria monocytogenes NOT DETECTED NOT DETECTED Final   Staphylococcus species NOT DETECTED NOT DETECTED Final   Staphylococcus aureus NOT DETECTED NOT DETECTED Final   Methicillin resistance NOT DETECTED NOT DETECTED Final   Streptococcus species DETECTED (A) NOT DETECTED Final    Comment: CRITICAL RESULT CALLED TO, READ BACK BY AND VERIFIED WITH: Lytle Butte 05/20/16 @ 1009 M VESTAL    Streptococcus agalactiae NOT DETECTED NOT DETECTED Final   Streptococcus pneumoniae NOT DETECTED NOT DETECTED Final   Streptococcus pyogenes NOT DETECTED NOT DETECTED Final   Acinetobacter baumannii NOT DETECTED NOT DETECTED Final   Enterobacteriaceae species NOT DETECTED NOT DETECTED Final   Enterobacter cloacae complex NOT DETECTED NOT DETECTED Final   Escherichia coli NOT DETECTED NOT DETECTED Final   Klebsiella oxytoca NOT DETECTED NOT DETECTED Final   Klebsiella pneumoniae NOT DETECTED NOT DETECTED Final   Proteus species NOT DETECTED NOT DETECTED Final   Serratia marcescens NOT DETECTED NOT DETECTED Final   Carbapenem resistance NOT DETECTED NOT DETECTED Final   Haemophilus influenzae NOT DETECTED NOT DETECTED Final   Neisseria meningitidis NOT DETECTED NOT DETECTED Final   Pseudomonas aeruginosa NOT DETECTED NOT DETECTED Final   Candida albicans NOT DETECTED NOT DETECTED Final   Candida glabrata NOT DETECTED NOT DETECTED Final   Candida krusei NOT DETECTED NOT DETECTED Final   Candida parapsilosis NOT DETECTED NOT DETECTED Final   Candida tropicalis NOT DETECTED NOT DETECTED Final  Urine culture      Status: Abnormal   Collection Time: 05/19/16  6:40 PM  Result Value Ref Range Status   Specimen Description URINE,  CATHETERIZED  Final   Special Requests NONE  Final   Culture <10,000 COLONIES/mL INSIGNIFICANT GROWTH (A)  Final   Report Status 05/21/2016 FINAL  Final  C difficile quick screen w PCR reflex     Status: Abnormal   Collection Time: 05/21/16  5:37 AM  Result Value Ref Range Status   C Diff antigen POSITIVE (A) NEGATIVE Final   C Diff toxin NEGATIVE NEGATIVE Final   C Diff interpretation   Final    C. difficile present, but toxin not detected. This indicates colonization. In most cases, this does not require treatment. If patient has signs and symptoms consistent with colitis, consider treatment. Requires ENTERIC precautions.  Culture, blood (Routine X 2) w Reflex to ID Panel     Status: None (Preliminary result)   Collection Time: 05/23/16  2:29 PM  Result Value Ref Range Status   Specimen Description BLOOD LEFT HAND  Final   Special Requests IN PEDIATRIC BOTTLE 3CC  Final   Culture NO GROWTH < 24 HOURS  Final   Report Status PENDING  Incomplete  Culture, blood (Routine X 2) w Reflex to ID Panel     Status: None (Preliminary result)   Collection Time: 05/23/16  2:38 PM  Result Value Ref Range Status   Specimen Description BLOOD LEFT HAND  Final   Special Requests IN PEDIATRIC BOTTLE  1CC  Final   Culture NO GROWTH < 24 HOURS  Final   Report Status PENDING  Incomplete         Radiology Studies: Dg Chest Port 1 View  05/24/2016  CLINICAL DATA:  Respiratory failure. EXAM: PORTABLE CHEST 1 VIEW COMPARISON:  05/21/2016. FINDINGS: Feeding tube and right PICC line stable position. Bibasilar atelectasis. Left lower lobe infiltrate with slight interim improvement. No pleural effusion or pneumothorax. IMPRESSION: 1.  Lines and tubes stable position. 2. Low lung volumes with bibasilar atelectasis. Left lower lobe infiltrate with slight improvement from prior exam .  Electronically Signed   By: Maisie Fus  Register   On: 05/24/2016 06:56   Dg Abd Portable 1v  05/25/2016  CLINICAL DATA:  Nasogastric tube placement.  Initial encounter. EXAM: PORTABLE ABDOMEN - 1 VIEW COMPARISON:  Abdominal radiograph performed 05/20/2016 FINDINGS: The patient's enteric tube is seen coiling within the stomach. This will likely progress further with time. The visualized bowel gas pattern is unremarkable. Scattered air and stool filled loops of colon are seen; no abnormal dilatation of small bowel loops is seen to suggest small bowel obstruction. No free intra-abdominal air is identified, though evaluation for free air is limited on a single supine view. The visualized osseous structures are within normal limits; the sacroiliac joints are unremarkable in appearance. The visualized portions of the lungs are essentially clear. IMPRESSION: Enteric tube noted coiling within the stomach. This will likely progress further with time. Electronically Signed   By: Roanna Raider M.D.   On: 05/25/2016 02:06        Scheduled Meds: . antiseptic oral rinse  7 mL Mouth Rinse q12n4p  . atorvastatin  20 mg Per Tube q1800  . cefTRIAXone (ROCEPHIN)  IV  2 g Intravenous Q24H  . chlorhexidine  15 mL Mouth Rinse BID  . feeding supplement (PRO-STAT SUGAR FREE 64)  30 mL Per Tube Daily  . folic acid  1 mg Per Tube Daily  . insulin aspart  0-9 Units Subcutaneous Q4H  . ipratropium-albuterol  3 mL Nebulization QID  . levETIRAcetam  1,000 mg Per Tube BID  .  thiamine  100 mg Per Tube Daily  . Valproate Sodium  250 mg Per Tube Q8H   Continuous Infusions: . feeding supplement (JEVITY 1.2 CAL) 1,000 mL (05/25/16 1225)     LOS: 6 days    Time spent: 30 minutes    Linzee Depaul, MD [facility] Pager [phone]   If 7PM-7AM, please contact night-coverage [email or web address] Password [facility] 05/25/2016, 3:45 PM

## 2016-05-25 NOTE — Progress Notes (Signed)
Patient cortrak had come out slightly, paged Elink for order to scan for placement verification before restarting TF if deemed appropriate. Will continue to monitor patient closely.

## 2016-05-26 LAB — URINE CULTURE: Culture: <10000 — AB

## 2016-05-26 LAB — GLUCOSE, CAPILLARY
GLUCOSE-CAPILLARY: 101 mg/dL — AB (ref 65–99)
GLUCOSE-CAPILLARY: 106 mg/dL — AB (ref 65–99)
GLUCOSE-CAPILLARY: 98 mg/dL (ref 65–99)
Glucose-Capillary: 98 mg/dL (ref 65–99)
Glucose-Capillary: 102 mg/dL — ABNORMAL HIGH (ref 65–99)

## 2016-05-26 NOTE — Progress Notes (Signed)
PROGRESS NOTE    Dale [name]  ZOX:[id number] DOB: 01/21/1966 DOA: 05/19/2016 PCP: No primary care provider on file.    Brief Narrative:    50 year old male with h/o strep pneumonia meningitis, dysphagia, dementia and hospital course complicated by cardiac arrest and cardiogenic shock in March 2016, ARF, shock liver, adrenal insufficiency presents with acute encephalopathy.   Recent Events in the last 3 months;  3/14-4/6: Admitted with strep pneumo meningitis, c/b cardiac arrest, and cardiogenic shock (EF 20%, recovered to 50%) briefly on amiodarone, ARDS requiring prolonged intubation and trach (now removed), ARF, shock liver, and adrenal insufficiency. He was discharged on 4/6 with weekly Pen G IM until 4/10  4/16-5/1: Readmitted with fall, altered mental status. Repeat LP had no growth, TEE without vegetation, ultimately transitioned to ceftriaxone 2g BID with plans for >4 weeks IV antibiotics. During this hospitalization, he was planning for SNF placement until the day of discharge when the patient's brother asked to take him home (it appears that this wasn't verified with the patient's estranged but legally married wife).  Since discharge, patient has returned to the ED 4 times because of loss of PICC line, PICC requiring to be flushed, PICC coming out, or patient wandering unattended in the street (he has dementia since his meningitis).  5/24-5/26: Readmitted for placement to SNF, given additional week of rocephin and was supposed to follow up with ID before stopping rocephin.  On 6/15 patient came from NH, due to acute encephalopathy, seizure, .   Multiple readmissions to the hospital, patient became non verbal, non ambulatory, encephalopathic, patient ended up in SNF.   Assessment & Plan:   Principal Problem:   Septic shock (HCC) Active Problems:   Acute encephalopathy   Meningitis, pneumococcal, recurrent   Cerebral septic emboli (HCC)   Dementia   Convulsions/seizures  (HCC)   Dysphagia   Hypothermia   Acute respiratory failure with hypoxemia (HCC)   Acute Respiratory Failure : Resolved not requiring oxygen.  Duuonebs, NT suctioning as needed.   Dysphagia: on NG tube feedings    Recurrently streptococcal meningitis/ seizures Resume 1000 mg bid, IV ativan,. Resume rocephin .   Fever overnight: Get labs in am. Repeat CXR in am.  Blood cultures drawn from 6/19 have been negative so far. Monitor.   Septic shock: Recurrent meningitis/ blood cultures  Negative s ofar.    Acute encephalopathy from sepsis and recurrent meningitis.    Hyperkalemia: resolved.   Acute Kidney Injury: - probably from sepsis and dehydration.  - improved.    Protein calorie malnutrition: Dietary recommendations.     DVT prophylaxis: SCD's Code Status: FULL CODE Family Communication: none at bedside.  Disposition Plan: pending further management.    Consultants:   PCCM  Neurology    Procedures:  Blood Ctx x2 6/15 >> GPC >> Strep viridans  BC Reflex 6/15 >> Strep viridans  Urine Ctx 6/15 >> MRSA PCR 6/16 >> U. Strep Antigen 6/16 >> negative   Antimicrobials:  Vancomycin 6/15 >>6/19 Rocephin 6/15 >>  SIGNIFICANT EVENTS: 3/14 - 4/06 - Admit w/ Strep pneumo meningitis, cardiogenic shock & cardiac arrest. ARDS s/p trach & removal. 4/16 - 5/01 - Readmit after fall w/ AMS.  5/24 - 5/26 - Readmitted for SNF placement 6/15 - Admit from SNF w/ decreased mentation   LINES/TUBES: FOLEY 6/15 >> R DL PICC 8/11 >>  Subjective: Non verbal. Appears comfortable.  No family at bedside.  Objective: Filed Vitals:   05/26/16 1200 05/26/16 1311 05/26/16  1509 05/26/16 1655  BP: 101/79 101/79  103/74  Pulse: 99 94    Temp:  99.4 F (37.4 C)  98.5 F (36.9 C)  TempSrc:  Axillary  Oral  Resp: 18 15    Height:      Weight:      SpO2: 98% 98% 100%     Intake/Output Summary (Last 24 hours) at 05/26/16 1742 Last data filed at 05/26/16 1653   Gross per 24 hour  Intake   1545 ml  Output   1625 ml  Net    -80 ml   Filed Weights   05/24/16 1527 05/25/16 0500 05/26/16 0500  Weight: 56.7 kg (125 lb) 54.4 kg (119 lb 14.9 oz) 54.2 kg (119 lb 7.8 oz)    Examination:  General exam: Appears calm and comfortable  Respiratory system: Clear to auscultation. Respiratory effort normal. Cardiovascular system: S1 & S2 heard, RRR. No JVD, murmurs, rubs, gallops or clicks. No pedal edema. Gastrointestinal system: Abdomen is nondistended, soft and nontender. No organomegaly or masses felt. Normal bowel sounds heard. Central nervous system: Alert and oriented. No focal neurological deficits. Extremities: Symmetric 5 x 5 power. Skin: No rashes, lesions or ulcers Psychiatry: Judgement and insight appear normal. Mood & affect appropriate.     Data Reviewed: I have personally reviewed following labs and imaging studies  CBC:  Recent Labs Lab 05/21/16 0400 05/22/16 0500 05/23/16 0430 05/24/16 0525 05/25/16 0319  WBC 8.6 8.5 9.9 12.6* 10.2  HGB 9.1* 9.1* 8.8* 8.5* 8.5*  HCT 27.5* 27.1* 26.3* 26.0* 25.9*  MCV 85.1 83.9 84.6 87.2 86.0  PLT 86* 65* 67* 88* 94*   Basic Metabolic Panel:  Recent Labs Lab 05/21/16 0400 05/21/16 1658 05/22/16 0500 05/23/16 0430 05/24/16 0525 05/25/16 0319  NA 138  --  139 138 140 140  K 3.7  --  4.3 5.1 4.6 5.0  CL 111  --  109 106 105 103  CO2 20*  --  23 23 26 29   GLUCOSE 68  --  110* 107* 117* 87  BUN 18  --  13 18 19 20   CREATININE 1.13  --  1.14 1.22 0.94 0.91  CALCIUM 8.4*  --  8.6* 8.7* 9.0 9.0  MG 1.9 2.4 2.2 2.2 2.2 2.2  PHOS 2.6 5.1* 4.9* 6.0* 5.6* 6.5*   GFR: Estimated Creatinine Clearance: 73.6 mL/min (by C-G formula based on Cr of 0.91). Liver Function Tests:  Recent Labs Lab 05/20/16 0426 05/21/16 0400  AST 128* 73*  ALT 328* 208*  ALKPHOS 89 71  BILITOT 0.3 0.3  PROT 5.6* 4.8*  ALBUMIN 2.6* 2.1*   No results for input(s): LIPASE, AMYLASE in the last 168 hours. No  results for input(s): AMMONIA in the last 168 hours. Coagulation Profile:  Recent Labs Lab 05/19/16 2115  INR 1.27   Cardiac Enzymes:  Recent Labs Lab 05/20/16 0423 05/20/16 0805 05/20/16 1235  TROPONINI <0.03 <0.03 <0.03   BNP (last 3 results) No results for input(s): PROBNP in the last 8760 hours. HbA1C: No results for input(s): HGBA1C in the last 72 hours. CBG:  Recent Labs Lab 05/25/16 2350 05/26/16 0524 05/26/16 0834 05/26/16 1304 05/26/16 1728  GLUCAP 103* 98 101* 106* 98   Lipid Profile: No results for input(s): CHOL, HDL, LDLCALC, TRIG, CHOLHDL, LDLDIRECT in the last 72 hours. Thyroid Function Tests: No results for input(s): TSH, T4TOTAL, FREET4, T3FREE, THYROIDAB in the last 72 hours. Anemia Panel: No results for input(s): VITAMINB12, FOLATE, FERRITIN, TIBC, IRON, RETICCTPCT  in the last 72 hours. Sepsis Labs:  Recent Labs Lab 05/19/16 2011 05/19/16 2115 05/20/16 0111 05/20/16 1235 05/21/16 0400 05/22/16 0500  PROCALCITON  --  0.13  --  1.00 2.30 1.18  LATICACIDVEN 1.90 1.4 1.1  --   --   --     Recent Results (from the past 240 hour(s))  Blood Culture (routine x 2)     Status: None   Collection Time: 05/19/16  3:05 PM  Result Value Ref Range Status   Specimen Description BLOOD RIGHT PICC LINE  Final   Special Requests BOTTLES DRAWN AEROBIC AND ANAEROBIC 5CC  Final   Culture NO GROWTH 5 DAYS  Final   Report Status 05/24/2016 FINAL  Final  Blood Culture (routine x 2)     Status: Abnormal   Collection Time: 05/19/16  3:18 PM  Result Value Ref Range Status   Specimen Description BLOOD LEFT HAND  Final   Special Requests BOTTLES DRAWN AEROBIC AND ANAEROBIC 5CC  Final   Culture  Setup Time   Final    GRAM POSITIVE COCCI IN CHAINS AEROBIC BOTTLE ONLY CRITICAL RESULT CALLED TO, READ BACK BY AND VERIFIED WITH: M MACCIA 05/20/16 @ 1009 M VESTAL    Culture (A)  Final    VIRIDANS STREPTOCOCCUS THE SIGNIFICANCE OF ISOLATING THIS ORGANISM FROM A  SINGLE SET OF BLOOD CULTURES WHEN MULTIPLE SETS ARE DRAWN IS UNCERTAIN. PLEASE NOTIFY THE MICROBIOLOGY DEPARTMENT WITHIN ONE WEEK IF SPECIATION AND SENSITIVITIES ARE REQUIRED.    Report Status 05/22/2016 FINAL  Final  Blood Culture ID Panel (Reflexed)     Status: Abnormal   Collection Time: 05/19/16  3:18 PM  Result Value Ref Range Status   Enterococcus species NOT DETECTED NOT DETECTED Final   Vancomycin resistance NOT DETECTED NOT DETECTED Final   Listeria monocytogenes NOT DETECTED NOT DETECTED Final   Staphylococcus species NOT DETECTED NOT DETECTED Final   Staphylococcus aureus NOT DETECTED NOT DETECTED Final   Methicillin resistance NOT DETECTED NOT DETECTED Final   Streptococcus species DETECTED (A) NOT DETECTED Final    Comment: CRITICAL RESULT CALLED TO, READ BACK BY AND VERIFIED WITH: Lytle ButteM MACCIA 05/20/16 @ 1009 M VESTAL    Streptococcus agalactiae NOT DETECTED NOT DETECTED Final   Streptococcus pneumoniae NOT DETECTED NOT DETECTED Final   Streptococcus pyogenes NOT DETECTED NOT DETECTED Final   Acinetobacter baumannii NOT DETECTED NOT DETECTED Final   Enterobacteriaceae species NOT DETECTED NOT DETECTED Final   Enterobacter cloacae complex NOT DETECTED NOT DETECTED Final   Escherichia coli NOT DETECTED NOT DETECTED Final   Klebsiella oxytoca NOT DETECTED NOT DETECTED Final   Klebsiella pneumoniae NOT DETECTED NOT DETECTED Final   Proteus species NOT DETECTED NOT DETECTED Final   Serratia marcescens NOT DETECTED NOT DETECTED Final   Carbapenem resistance NOT DETECTED NOT DETECTED Final   Haemophilus influenzae NOT DETECTED NOT DETECTED Final   Neisseria meningitidis NOT DETECTED NOT DETECTED Final   Pseudomonas aeruginosa NOT DETECTED NOT DETECTED Final   Candida albicans NOT DETECTED NOT DETECTED Final   Candida glabrata NOT DETECTED NOT DETECTED Final   Candida krusei NOT DETECTED NOT DETECTED Final   Candida parapsilosis NOT DETECTED NOT DETECTED Final   Candida tropicalis  NOT DETECTED NOT DETECTED Final  Urine culture     Status: Abnormal   Collection Time: 05/19/16  6:40 PM  Result Value Ref Range Status   Specimen Description URINE, CATHETERIZED  Final   Special Requests NONE  Final   Culture <10,000 COLONIES/mL  INSIGNIFICANT GROWTH (A)  Final   Report Status 05/21/2016 FINAL  Final  C difficile quick screen w PCR reflex     Status: Abnormal   Collection Time: 05/21/16  5:37 AM  Result Value Ref Range Status   C Diff antigen POSITIVE (A) NEGATIVE Final   C Diff toxin NEGATIVE NEGATIVE Final   C Diff interpretation   Final    C. difficile present, but toxin not detected. This indicates colonization. In most cases, this does not require treatment. If patient has signs and symptoms consistent with colitis, consider treatment. Requires ENTERIC precautions.  Culture, blood (Routine X 2) w Reflex to ID Panel     Status: None (Preliminary result)   Collection Time: 05/23/16  2:29 PM  Result Value Ref Range Status   Specimen Description BLOOD LEFT HAND  Final   Special Requests IN PEDIATRIC BOTTLE 3CC  Final   Culture NO GROWTH 3 DAYS  Final   Report Status PENDING  Incomplete  Culture, blood (Routine X 2) w Reflex to ID Panel     Status: None (Preliminary result)   Collection Time: 05/23/16  2:38 PM  Result Value Ref Range Status   Specimen Description BLOOD LEFT HAND  Final   Special Requests IN PEDIATRIC BOTTLE  1CC  Final   Culture NO GROWTH 3 DAYS  Final   Report Status PENDING  Incomplete  Urine culture     Status: Abnormal   Collection Time: 05/25/16  2:09 AM  Result Value Ref Range Status   Specimen Description URINE, CATHETERIZED  Final   Special Requests NONE  Final   Culture <10,000 COLONIES/mL INSIGNIFICANT GROWTH (A)  Final   Report Status 05/26/2016 FINAL  Final         Radiology Studies: Dg Abd Portable 1v  05/25/2016  CLINICAL DATA:  Nasogastric tube placement.  Initial encounter. EXAM: PORTABLE ABDOMEN - 1 VIEW COMPARISON:   Abdominal radiograph performed 05/20/2016 FINDINGS: The patient's enteric tube is seen coiling within the stomach. This will likely progress further with time. The visualized bowel gas pattern is unremarkable. Scattered air and stool filled loops of colon are seen; no abnormal dilatation of small bowel loops is seen to suggest small bowel obstruction. No free intra-abdominal air is identified, though evaluation for free air is limited on a single supine view. The visualized osseous structures are within normal limits; the sacroiliac joints are unremarkable in appearance. The visualized portions of the lungs are essentially clear. IMPRESSION: Enteric tube noted coiling within the stomach. This will likely progress further with time. Electronically Signed   By: Roanna Raider M.D.   On: 05/25/2016 02:06        Scheduled Meds: . antiseptic oral rinse  7 mL Mouth Rinse q12n4p  . atorvastatin  20 mg Per Tube q1800  . cefTRIAXone (ROCEPHIN)  IV  2 g Intravenous Q24H  . chlorhexidine  15 mL Mouth Rinse BID  . feeding supplement (PRO-STAT SUGAR FREE 64)  30 mL Per Tube Daily  . folic acid  1 mg Per Tube Daily  . insulin aspart  0-9 Units Subcutaneous Q4H  . ipratropium-albuterol  3 mL Nebulization QID  . levETIRAcetam  1,000 mg Per Tube BID  . thiamine  100 mg Per Tube Daily  . Valproate Sodium  250 mg Per Tube Q8H   Continuous Infusions: . feeding supplement (JEVITY 1.2 CAL) 1,000 mL (05/26/16 0500)     LOS: 7 days    Time spent: 30 minutes  Kathlen Mody, MD [facility] Pager [phone]   If 7PM-7AM, please contact night-coverage [email or web address] Password [facility] 05/26/2016, 5:42 PM

## 2016-05-27 ENCOUNTER — Inpatient Hospital Stay (HOSPITAL_COMMUNITY): Payer: Medicaid Other

## 2016-05-27 LAB — CBC
HCT: 28.1 % — ABNORMAL LOW (ref 39.0–52.0)
Hemoglobin: 9 g/dL — ABNORMAL LOW (ref 13.0–17.0)
MCH: 28.2 pg (ref 26.0–34.0)
MCHC: 32 g/dL (ref 30.0–36.0)
MCV: 88.1 fL (ref 78.0–100.0)
PLATELETS: 214 10*3/uL (ref 150–400)
RBC: 3.19 MIL/uL — AB (ref 4.22–5.81)
RDW: 15.6 % — AB (ref 11.5–15.5)
WBC: 8 10*3/uL (ref 4.0–10.5)

## 2016-05-27 LAB — GLUCOSE, CAPILLARY
GLUCOSE-CAPILLARY: 106 mg/dL — AB (ref 65–99)
GLUCOSE-CAPILLARY: 110 mg/dL — AB (ref 65–99)
GLUCOSE-CAPILLARY: 119 mg/dL — AB (ref 65–99)
GLUCOSE-CAPILLARY: 84 mg/dL (ref 65–99)
Glucose-Capillary: 112 mg/dL — ABNORMAL HIGH (ref 65–99)
Glucose-Capillary: 109 mg/dL — ABNORMAL HIGH (ref 65–99)

## 2016-05-27 LAB — BASIC METABOLIC PANEL
ANION GAP: 6 (ref 5–15)
BUN: 30 mg/dL — ABNORMAL HIGH (ref 6–20)
CALCIUM: 9.4 mg/dL (ref 8.9–10.3)
CO2: 32 mmol/L (ref 22–32)
Chloride: 102 mmol/L (ref 101–111)
Creatinine, Ser: 1.03 mg/dL (ref 0.61–1.24)
GLUCOSE: 99 mg/dL (ref 65–99)
Potassium: 4.6 mmol/L (ref 3.5–5.1)
SODIUM: 140 mmol/L (ref 135–145)

## 2016-05-27 NOTE — Progress Notes (Addendum)
Nutrition Follow Up  DOCUMENTATION CODES:   Non-severe (moderate) malnutrition in context of chronic illness  INTERVENTION:    Continue Jevity 1.2 @ 65 ml/hr via CORTRAK tube  Continue 30 ml Prostat daily  Above TF regimen provides 1972 kcal, 101 grams protein, and 1263 ml of free water daily  NUTRITION DIAGNOSIS:   Malnutrition (Moderate) related to chronic illness (meningitis) as evidenced by moderate depletions of muscle mass, moderate depletion of body fat, ongoing  GOAL:   Patient will meet greater than or equal to 90% of their needs, met  MONITOR:   TF tolerance, Diet advancement, Labs, Weight trends, Skin, I & O's   ASSESSMENT:   Pt with hx of strep pneumo meningitis, cardiogenic shock and arrest 3/14-4/6, readmitted after fall with AMS 4/16-5/1, readmitted for SNF placement 5/24-5/26, now admitted 6/15 from SNF with decreased mentation and witnessed seizure. Pt was hypothermic on arrival, suspect underlying sepsis with UTI as likely source.    Pt admitted with acute respiratory failure and seizure. Hx of recurrent pneumococcal meningitis.  Jevity 1.2 formula infusing at 65 ml/hr with Prostat liquid protein 30 ml daily via CORTRAK small bore feeding tube providing 1972 kcal, 101 grams protein, and 1263 ml of free water daily. PT note reviewed >> pt unable to follow commands with all 4 extremities and remains very weak. Pt is from [facility] >> possible D/C back when stable.  Diet Order:  Diet NPO time specified  Skin:  Reviewed, no issues  Last BM:  6/22  Height:   Ht Readings from Last 1 Encounters:  05/20/16 '5\' 8"'$  (1.727 m)    Weight:   Wt Readings from Last 1 Encounters:  05/27/16 117 lb 4.6 oz (53.2 kg)    Ideal Body Weight:  70 kg  BMI:  Body mass index is 17.84 kg/(m^2).  Estimated Nutritional Needs:   Kcal:  1850-2100  Protein:  90-115 grams  Fluid:  >2 L/day  EDUCATION NEEDS:   No education needs identified at this time  Arthur Holms, RD, LDN Pager #: [phone] After-Hours Pager #: [phone]

## 2016-05-27 NOTE — Progress Notes (Signed)
PROGRESS NOTE    Bonnita HollowCraig D Manansala  ZOX:[id number] DOB: 09/16/1966 DOA: 05/19/2016 PCP: No primary care provider on file.    Brief Narrative:    50 year old male with h/o strep pneumonia meningitis, dysphagia, dementia and hospital course complicated by cardiac arrest and cardiogenic shock in March 2016, ARF, shock liver, adrenal insufficiency presents with acute encephalopathy.   Recent Events in the last 3 months;  3/14-4/6: Admitted with strep pneumo meningitis, c/b cardiac arrest, and cardiogenic shock (EF 20%, recovered to 50%) briefly on amiodarone, ARDS requiring prolonged intubation and trach (now removed), ARF, shock liver, and adrenal insufficiency. He was discharged on 4/6 with weekly Pen G IM until 4/10  4/16-5/1: Readmitted with fall, altered mental status. Repeat LP had no growth, TEE without vegetation, ultimately transitioned to ceftriaxone 2g BID with plans for >4 weeks IV antibiotics. During this hospitalization, he was planning for SNF placement until the day of discharge when the patient's brother asked to take him home (it appears that this wasn't verified with the patient's estranged but legally married wife).  Since discharge, patient has returned to the ED 4 times because of loss of PICC line, PICC requiring to be flushed, PICC coming out, or patient wandering unattended in the street (he has dementia since his meningitis).  5/24-5/26: Readmitted for placement to SNF, given additional week of rocephin and was supposed to follow up with ID before stopping rocephin.  On 6/15 patient came from NH, due to acute encephalopathy, seizure, . He was restarted on vancomycin and rocephin for encephalopathy and sepsis of unclear etiology .    Assessment & Plan:   Principal Problem:   Septic shock (HCC) Active Problems:   Acute encephalopathy   Meningitis, pneumococcal, recurrent   Cerebral septic emboli (HCC)   Dementia   Convulsions/seizures (HCC)   Dysphagia  Hypothermia   Acute respiratory failure with hypoxemia (HCC)   Acute Respiratory Failure ; unclear etiology.  Resolved not requiring oxygen.  Duuonebs, NT suctioning as needed.   Dysphagia on NG tube :  on NG tube feedings with JEVITY.  SLP following the patient.    Recurrent Pneumococcal  meningitis Plan was to discontinue rocephin by 5/26 and to be seen by ID prior to discontinuing the antibiotics.  But patient never followed up with ID. His rocephin was restarted on 6/15  Along with IV vancomycin on admission, for possible sepsis,. His blood cultures from 6/19 have been negative.  Discussed with Dr Drue SecondSnider over the phone, recommended stopping the antibiotic as cultures have been negative.       Septic shock on admission: unclear etiology.  He was restarted on IV vancomycin and IV Rocephin on 6/15 and vancomycin discontinued on 6/19.  Rocephin was continued.  Blood cultures drawn on 6/19 have been negative so far. If cultures  Repeat CXR shows resolving infection in the left mid and lower lung opacity.    Acute encephalopathy from ? sepsis and recurrent meningitis.  He at baseline is non verbal, can only give one word answers and follow simple commands.  He is very unsteady on his feet at baseline.  Neurology consulted for the episode of seizures he had at the facility prior to admission.  EEG done showed moderate generalized slowing of brain activity consistent with a global brain injury such as seen in hypoxia, metabolic, toxic, or infectious etiologies.  Neurology recommended restarting his dose of depakote and keppra.  No seizures since restarting his meds.     Hyperkalemia: resolved.  Acute Kidney Injury: - probably from sepsis and dehydration.  - improved.    Protein calorie malnutrition: Dietary recommendations.  Currently has NG tube with jevity running. Will request SLP eval and possible G tube placement by IR.  Discussed with wife regarding G tube  placement and the risk of him pulling it out.     DVT prophylaxis: SCD's Code Status: FULL CODE Family Communication: none at bedside. Talked to wife over the phone and updated.  Disposition Plan: pending further management.    Consultants:   PCCM  Neurology    Procedures:  Blood Ctx x2 6/15 >> GPC >> Strep viridans  BC Reflex 6/15 >> Strep viridans  Urine Ctx 6/15 >> MRSA PCR 6/16 >> U. Strep Antigen 6/16 >> negative   Antimicrobials:  Vancomycin 6/15 >>6/19 Rocephin 6/15 >>  SIGNIFICANT EVENTS: 3/14 - 4/06 - Admit w/ Strep pneumo meningitis, cardiogenic shock & cardiac arrest. ARDS s/p trach & removal. 4/16 - 5/01 - Readmit after fall w/ AMS.  5/24 - 5/26 - Readmitted for SNF placement 6/15 - Admit from SNF w/ decreased mentation   LINES/TUBES: FOLEY 6/15 >> R DL PICC 1/61 >>  Subjective: Non verbal. Appears comfortable.  No family at bedside.  Objective: Filed Vitals:   05/27/16 0804 05/27/16 0811 05/27/16 1249 05/27/16 1538  BP:  90/68 100/74   Pulse:  79 87   Temp:  97.9 F (36.6 C) 97.3 F (36.3 C)   TempSrc:  Axillary Axillary   Resp:  13 13   Height:      Weight:      SpO2: 99% 100%  99%    Intake/Output Summary (Last 24 hours) at 05/27/16 1608 Last data filed at 05/27/16 1500  Gross per 24 hour  Intake   1545 ml  Output   3150 ml  Net  -1605 ml   Filed Weights   05/25/16 0500 05/26/16 0500 05/27/16 0426  Weight: 54.4 kg (119 lb 14.9 oz) 54.2 kg (119 lb 7.8 oz) 53.2 kg (117 lb 4.6 oz)    Examination:  General exam: Appears calm and comfortable  Respiratory system: Clear to auscultation. Respiratory effort normal. Cardiovascular system: S1 & S2 heard, RRR. No JVD, murmurs, rubs, gallops or clicks. No pedal edema. Gastrointestinal system: Abdomen is nondistended, soft and nontender. No organomegaly or masses felt. Normal bowel sounds heard. Central nervous system: Alert and oriented. No focal neurological deficits. Extremities:  Symmetric 5 x 5 power. Skin: No rashes, lesions or ulcers Psychiatry: Judgement and insight appear normal. Mood & affect appropriate.     Data Reviewed: I have personally reviewed following labs and imaging studies  CBC:  Recent Labs Lab 05/22/16 0500 05/23/16 0430 05/24/16 0525 05/25/16 0319 05/27/16 0430  WBC 8.5 9.9 12.6* 10.2 8.0  HGB 9.1* 8.8* 8.5* 8.5* 9.0*  HCT 27.1* 26.3* 26.0* 25.9* 28.1*  MCV 83.9 84.6 87.2 86.0 88.1  PLT 65* 67* 88* 94* 214   Basic Metabolic Panel:  Recent Labs Lab 05/21/16 1658 05/22/16 0500 05/23/16 0430 05/24/16 0525 05/25/16 0319 05/27/16 0430  NA  --  139 138 140 140 140  K  --  4.3 5.1 4.6 5.0 4.6  CL  --  109 106 105 103 102  CO2  --  23 23 26 29  32  GLUCOSE  --  110* 107* 117* 87 99  BUN  --  13 18 19 20  30*  CREATININE  --  1.14 1.22 0.94 0.91 1.03  CALCIUM  --  8.6*  8.7* 9.0 9.0 9.4  MG 2.4 2.2 2.2 2.2 2.2  --   PHOS 5.1* 4.9* 6.0* 5.6* 6.5*  --    GFR: Estimated Creatinine Clearance: 63.8 mL/min (by C-G formula based on Cr of 1.03). Liver Function Tests:  Recent Labs Lab 05/21/16 0400  AST 73*  ALT 208*  ALKPHOS 71  BILITOT 0.3  PROT 4.8*  ALBUMIN 2.1*   No results for input(s): LIPASE, AMYLASE in the last 168 hours. No results for input(s): AMMONIA in the last 168 hours. Coagulation Profile: No results for input(s): INR, PROTIME in the last 168 hours. Cardiac Enzymes: No results for input(s): CKTOTAL, CKMB, CKMBINDEX, TROPONINI in the last 168 hours. BNP (last 3 results) No results for input(s): PROBNP in the last 8760 hours. HbA1C: No results for input(s): HGBA1C in the last 72 hours. CBG:  Recent Labs Lab 05/26/16 2042 05/27/16 0016 05/27/16 0505 05/27/16 0807 05/27/16 1246  GLUCAP 102* 110* 112* 106* 109*   Lipid Profile: No results for input(s): CHOL, HDL, LDLCALC, TRIG, CHOLHDL, LDLDIRECT in the last 72 hours. Thyroid Function Tests: No results for input(s): TSH, T4TOTAL, FREET4, T3FREE,  THYROIDAB in the last 72 hours. Anemia Panel: No results for input(s): VITAMINB12, FOLATE, FERRITIN, TIBC, IRON, RETICCTPCT in the last 72 hours. Sepsis Labs:  Recent Labs Lab 05/21/16 0400 05/22/16 0500  PROCALCITON 2.30 1.18    Recent Results (from the past 240 hour(s))  Blood Culture (routine x 2)     Status: None   Collection Time: 05/19/16  3:05 PM  Result Value Ref Range Status   Specimen Description BLOOD RIGHT PICC LINE  Final   Special Requests BOTTLES DRAWN AEROBIC AND ANAEROBIC 5CC  Final   Culture NO GROWTH 5 DAYS  Final   Report Status 05/24/2016 FINAL  Final  Blood Culture (routine x 2)     Status: Abnormal   Collection Time: 05/19/16  3:18 PM  Result Value Ref Range Status   Specimen Description BLOOD LEFT HAND  Final   Special Requests BOTTLES DRAWN AEROBIC AND ANAEROBIC 5CC  Final   Culture  Setup Time   Final    GRAM POSITIVE COCCI IN CHAINS AEROBIC BOTTLE ONLY CRITICAL RESULT CALLED TO, READ BACK BY AND VERIFIED WITH: M MACCIA 05/20/16 @ 1009 M VESTAL    Culture (A)  Final    VIRIDANS STREPTOCOCCUS THE SIGNIFICANCE OF ISOLATING THIS ORGANISM FROM A SINGLE SET OF BLOOD CULTURES WHEN MULTIPLE SETS ARE DRAWN IS UNCERTAIN. PLEASE NOTIFY THE MICROBIOLOGY DEPARTMENT WITHIN ONE WEEK IF SPECIATION AND SENSITIVITIES ARE REQUIRED.    Report Status 05/22/2016 FINAL  Final  Blood Culture ID Panel (Reflexed)     Status: Abnormal   Collection Time: 05/19/16  3:18 PM  Result Value Ref Range Status   Enterococcus species NOT DETECTED NOT DETECTED Final   Vancomycin resistance NOT DETECTED NOT DETECTED Final   Listeria monocytogenes NOT DETECTED NOT DETECTED Final   Staphylococcus species NOT DETECTED NOT DETECTED Final   Staphylococcus aureus NOT DETECTED NOT DETECTED Final   Methicillin resistance NOT DETECTED NOT DETECTED Final   Streptococcus species DETECTED (A) NOT DETECTED Final    Comment: CRITICAL RESULT CALLED TO, READ BACK BY AND VERIFIED WITH: M MACCIA  05/20/16 @ 1009 M VESTAL    Streptococcus agalactiae NOT DETECTED NOT DETECTED Final   Streptococcus pneumoniae NOT DETECTED NOT DETECTED Final   Streptococcus pyogenes NOT DETECTED NOT DETECTED Final   Acinetobacter baumannii NOT DETECTED NOT DETECTED Final   Enterobacteriaceae species NOT  DETECTED NOT DETECTED Final   Enterobacter cloacae complex NOT DETECTED NOT DETECTED Final   Escherichia coli NOT DETECTED NOT DETECTED Final   Klebsiella oxytoca NOT DETECTED NOT DETECTED Final   Klebsiella pneumoniae NOT DETECTED NOT DETECTED Final   Proteus species NOT DETECTED NOT DETECTED Final   Serratia marcescens NOT DETECTED NOT DETECTED Final   Carbapenem resistance NOT DETECTED NOT DETECTED Final   Haemophilus influenzae NOT DETECTED NOT DETECTED Final   Neisseria meningitidis NOT DETECTED NOT DETECTED Final   Pseudomonas aeruginosa NOT DETECTED NOT DETECTED Final   Candida albicans NOT DETECTED NOT DETECTED Final   Candida glabrata NOT DETECTED NOT DETECTED Final   Candida krusei NOT DETECTED NOT DETECTED Final   Candida parapsilosis NOT DETECTED NOT DETECTED Final   Candida tropicalis NOT DETECTED NOT DETECTED Final  Urine culture     Status: Abnormal   Collection Time: 05/19/16  6:40 PM  Result Value Ref Range Status   Specimen Description URINE, CATHETERIZED  Final   Special Requests NONE  Final   Culture <10,000 COLONIES/mL INSIGNIFICANT GROWTH (A)  Final   Report Status 05/21/2016 FINAL  Final  C difficile quick screen w PCR reflex     Status: Abnormal   Collection Time: 05/21/16  5:37 AM  Result Value Ref Range Status   C Diff antigen POSITIVE (A) NEGATIVE Final   C Diff toxin NEGATIVE NEGATIVE Final   C Diff interpretation   Final    C. difficile present, but toxin not detected. This indicates colonization. In most cases, this does not require treatment. If patient has signs and symptoms consistent with colitis, consider treatment. Requires ENTERIC precautions.  Culture, blood  (Routine X 2) w Reflex to ID Panel     Status: None (Preliminary result)   Collection Time: 05/23/16  2:29 PM  Result Value Ref Range Status   Specimen Description BLOOD LEFT HAND  Final   Special Requests IN PEDIATRIC BOTTLE 3CC  Final   Culture NO GROWTH 4 DAYS  Final   Report Status PENDING  Incomplete  Culture, blood (Routine X 2) w Reflex to ID Panel     Status: None (Preliminary result)   Collection Time: 05/23/16  2:38 PM  Result Value Ref Range Status   Specimen Description BLOOD LEFT HAND  Final   Special Requests IN PEDIATRIC BOTTLE  1CC  Final   Culture NO GROWTH 4 DAYS  Final   Report Status PENDING  Incomplete  Urine culture     Status: Abnormal   Collection Time: 05/25/16  2:09 AM  Result Value Ref Range Status   Specimen Description URINE, CATHETERIZED  Final   Special Requests NONE  Final   Culture <10,000 COLONIES/mL INSIGNIFICANT GROWTH (A)  Final   Report Status 05/26/2016 FINAL  Final         Radiology Studies: Dg Chest Port 1 View  05/27/2016  CLINICAL DATA:  50 year old male with fever, acute encephalopathy, seizure. Streptococcus meningitis in March, with hospital course at that time complicated by cardiac arrest and cardiogenic shock. Initial encounter. EXAM: PORTABLE CHEST 1 VIEW COMPARISON:  05/24/2016 and earlier. FINDINGS: Portable AP semi upright view at 1117 hours. Feeding tube in place, appears to loop in the stomach with tip pointed slightly retrograde at the proximal gastric body (arrow). Right PICC line remains in place. Asymmetric reticulonodular opacity in the left mid and lower lung has mildly regressed. Upper lobes remain clear. Right lung remains clear. No pneumothorax, pulmonary edema, pleural effusion or areas  of worsening ventilation. Mediastinal contours remain normal. IMPRESSION: 1. Regressed but not resolved left mid and lower lung reticulonodular opacity compatible with improving left lung infection. 2. No pleural effusion or new  cardiopulmonary abnormality. 3. Feeding tube looped in the stomach. Electronically Signed   By: Odessa Fleming M.D.   On: 05/27/2016 11:33        Scheduled Meds: . antiseptic oral rinse  7 mL Mouth Rinse q12n4p  . atorvastatin  20 mg Per Tube q1800  . cefTRIAXone (ROCEPHIN)  IV  2 g Intravenous Q24H  . chlorhexidine  15 mL Mouth Rinse BID  . feeding supplement (PRO-STAT SUGAR FREE 64)  30 mL Per Tube Daily  . folic acid  1 mg Per Tube Daily  . insulin aspart  0-9 Units Subcutaneous Q4H  . ipratropium-albuterol  3 mL Nebulization QID  . levETIRAcetam  1,000 mg Per Tube BID  . thiamine  100 mg Per Tube Daily  . Valproate Sodium  250 mg Per Tube Q8H   Continuous Infusions: . feeding supplement (JEVITY 1.2 CAL) 1,000 mL (05/26/16 2233)     LOS: 8 days    Time spent: 30 minutes    Ryot Burrous, MD [facility] Pager [phone]   If 7PM-7AM, please contact night-coverage [email or web address] Password [facility] 05/27/2016, 4:08 PM

## 2016-05-27 NOTE — Progress Notes (Signed)
Physical Therapy Treatment Patient Details Name: Dale [name] MRN: [id number] DOB: 09/17/1966 Today's Date: 05/27/2016    History of Present Illness Pt adm with acute respiratory failure and seizure. PMH - pneumonia meningitis and hospital course complicated by cardiac arrest and cardiogenic shock in March 2016, multiple readmissions with discharge to SNF; dementia; cerebral septic emboli, CHF    PT Comments    Patient able to follow commands with all 4 extremities with delay and remains very weak. Able to activate trunk musculature in sitting EOB x 15 minutes.  Follow Up Recommendations  SNF     Equipment Recommendations  Other (comment) (to be determined in next venue)    Recommendations for Other Services       Precautions / Restrictions Precautions Precautions: Fall Precaution Comments: cortek, multiple lines Restrictions Weight Bearing Restrictions: No    Mobility  Bed Mobility Overal bed mobility: Needs Assistance;+2 for physical assistance;+ 2 for safety/equipment Bed Mobility: Supine to Sit;Sit to Supine     Supine to sit: +2 for physical assistance;HOB elevated;+2 for safety/equipment;Max assist Sit to supine: +2 for physical assistance;Total assist   General bed mobility comments: pt assisted with pulling up to long-sitting and then walking legs around off EOB; very fatigued with return to bed total assist  Transfers                 General transfer comment: unable  Ambulation/Gait                 Stairs            Wheelchair Mobility    Modified Rankin (Stroke Patients Only)       Balance Overall balance assessment: Needs assistance;History of Falls Sitting-balance support: Bilateral upper extremity supported;Feet supported Sitting balance-Leahy Scale: Zero Sitting balance - Comments: initially max to total assist; progressed to minguard assist for up to 30 seconds and several minutes of min assist Postural control: Right  lateral lean                          Cognition Arousal/Alertness: Awake/alert Behavior During Therapy: Flat affect Overall Cognitive Status: No family/caregiver present to determine baseline cognitive functioning Area of Impairment: Orientation;Problem solving;Awareness;Following commands;Safety/judgement;Attention Orientation Level: Place;Time;Situation (named place with max cues) Current Attention Level: Sustained Memory: Decreased short-term memory Following Commands: Follows one step commands inconsistently;Follows one step commands with increased time Safety/Judgement: Decreased awareness of deficits;Decreased awareness of safety Awareness:  (pre-intellectual) Problem Solving: Slow processing;Decreased initiation;Requires verbal cues;Requires tactile cues;Difficulty sequencing General Comments: followed simple commands with all extremities; poor sequencing with his attempt to move to sitting at EOB (tried to move to long-sitting)    Exercises General Exercises - Lower Extremity Ankle Circles/Pumps: AAROM;Both Heel Slides: AAROM;Both (3 reps with limited active flexion, better extension <2/5) Hip ABduction/ADduction: AAROM;Both (3 reps) Other Exercises Other Exercises: seated reaching laterally along mattress and pulling trunk back to midline; reaching forward and return to midline, pushing forward with LUE on PTs shoulder    General Comments        Pertinent Vitals/Pain Pain Assessment: Faces Faces Pain Scale: No hurt    Home Living                      Prior Function            PT Goals (current goals can now be found in the care plan section) Acute Rehab PT Goals Patient Stated Goal: Pt unable  to state Time For Goal Achievement: 06/07/16 Progress towards PT goals: Progressing toward goals    Frequency  Min 2X/week    PT Plan Current plan remains appropriate    Co-evaluation             End of Session   Activity Tolerance: Patient  tolerated treatment well Patient left: in bed;with call bell/phone within reach;with bed alarm set     Time: [id number] PT Time Calculation (min) (ACUTE ONLY): 23 min  Charges:  $Therapeutic Activity: 23-37 mins                    G Codes:      Pleas Carneal 05/27/2016, 11:09 AM Pager [phone]

## 2016-05-27 NOTE — Evaluation (Signed)
Clinical/Bedside Swallow Evaluation Patient Details  Name: Dale [name] MRN: [id number] Date of Birth: 09/11/1966  Today's Date: 05/27/2016 Time: SLP Start Time (ACUTE ONLY): 1658 SLP Stop Time (ACUTE ONLY): 1707 SLP Time Calculation (min) (ACUTE ONLY): 9 min  Past Medical History:  Past Medical History  Diagnosis Date  . Depression   . Cardiac arrest (HCC)   . Cardiogenic shock (HCC)   . Acute respiratory failure with hypoxia (HCC)   . Meningitis, streptococcal 02/16/2016  . 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 Tobacco abuse 02/16/2016  . Substance abuse   . ETOH abuse   . Septic shock (HCC)   . Urinary retention 05/04/2016    Urinary retention - Continue Flomax     . Systolic and diastolic CHF, chronic (HCC)   . 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 Severe sepsis (HCC)   . Respiratory failure (HCC) 03/03/2016  . Protein-calorie malnutrition, severe (HCC)   . NSTEMI (non-ST elevated myocardial infarction) (HCC) 03/14/2016  . Meningoencephalitis   . Meningitis, pneumococcal, recurrent   . Low serum cortisol level (HCC) 05/04/2016    Low a.m. cortisol  - Was 1.2 checked on 04/03/2016  - This likely is reflective of cortisol suppression from steroid use - Tapered steroids off   . Hypothermia 03/20/2016  . Hypernatremia   . Hyperlipidemia 05/04/2016    Dyslipidemia - Continue statin.     Marland Kitchen. Hydrocephalus   . Dysphagia 05/04/2016    Dysphagia - Dysphagia 1 diet per SLP     . Dementia 04/27/2016  . Convulsions/seizures (HCC) 05/04/2016    Seizure disorder - Continue Keppra, Depakote.     . CHF (congestive heart failure) (HCC)   . Cerebral thrombosis with cerebral infarction 03/25/2016  . Cerebral septic emboli (HCC)   . Altered mental status   . AKI (acute kidney injury) (HCC) 02/16/2016  . Acute renal failure (HCC)   . Acute ischemic stroke (HCC)   . Acute encephalopathy    Past Surgical History:  Past Surgical History  Procedure Laterality Date  . Ankle surgery    .  Cardiac catheterization N/A 02/17/2016    Procedure: IABP Insertion;  Surgeon: Iran OuchMuhammad A Arida, MD;  Location: [facility];  Service: Cardiovascular;  Laterality: N/A;  . Tee without cardioversion N/A 03/29/2016    Procedure: TRANSESOPHAGEAL ECHOCARDIOGRAM (TEE);  Surgeon: Laurey Moralealton S McLean, MD;  [facility];  Service: Cardiovascular;  Laterality: N/A;   HPI:  50 year old male with h/o strep pneumonia meningitis, dysphagia, dementia and hospital course complicated by cardiac arrest and cardiogenic shock in March 2016, ARF, shock liver, adrenal insufficiency presents with acute encephalopathy. SLP evaluations during previous admissions have recommended Dys 1 diet and nectar thick liquids with advancement to Dys 2 textures prior to discharge. Pt is pending possible PEG placement.   Assessment / Plan / Recommendation Clinical Impression  Pt has immediate coughing with sips of thin liquid likely due to delay in swallow as indicated on most recent MBS. No overt signs of aspiration are noted with nectar thick liquids or purees with only brief, and intermittent, oral holding noted. Recommend Dys 1 diet and nectar thick liquids. Will follow for diet advancement.    Aspiration Risk  Mild aspiration risk    Diet Recommendation Dysphagia 1 (Puree);Nectar-thick liquid   Liquid Administration via: Cup;Straw Medication Administration: Whole meds with puree Supervision: Patient able to self feed;Full supervision/cueing for compensatory strategies Compensations: Slow rate;Small sips/bites;Minimize environmental distractions Postural Changes: Seated upright at 90 degrees  Other  Recommendations Oral Care Recommendations: Oral care BID Other Recommendations: Order thickener from pharmacy;Prohibited food (jello, ice cream, thin soups);Remove water pitcher   Follow up Recommendations  Skilled Nursing facility    Frequency and Duration min 2x/week  2 weeks       Prognosis Prognosis for  Safe Diet Advancement: Good Barriers to Reach Goals: Cognitive deficits      Swallow Study   General HPI: 50 year old male with h/o strep pneumonia meningitis, dysphagia, dementia and hospital course complicated by cardiac arrest and cardiogenic shock in March 2016, ARF, shock liver, adrenal insufficiency presents with acute encephalopathy. SLP evaluations during previous admissions have recommended Dys 1 diet and nectar thick liquids with advancement to Dys 2 textures prior to discharge. Pt is pending possible PEG placement. Type of Study: Bedside Swallow Evaluation Previous Swallow Assessment: see HPI Diet Prior to this Study: NPO;NG Tube Temperature Spikes Noted: Yes (99.9) Respiratory Status: Room air History of Recent Intubation: No Behavior/Cognition: Alert;Cooperative Oral Cavity Assessment: Within Functional Limits Oral Care Completed by SLP: No Oral Cavity - Dentition: Edentulous Patient Positioning: Upright in bed Baseline Vocal Quality: Low vocal intensity (normal when prompted to get louder) Volitional Cough: Weak (stronger when prompted)    Oral/Motor/Sensory Function Overall Oral Motor/Sensory Function: Generalized oral weakness   Ice Chips Ice chips: Within functional limits Presentation: Spoon   Thin Liquid Thin Liquid: Impaired Presentation: Spoon;Straw Oral Phase Functional Implications: Prolonged oral transit Pharyngeal  Phase Impairments: Suspected delayed Swallow;Cough - Immediate    Nectar Thick Nectar Thick Liquid: Impaired Presentation: Straw Pharyngeal Phase Impairments: Suspected delayed Swallow   Honey Thick Honey Thick Liquid: Not tested   Puree Puree: Impaired Presentation: Spoon Pharyngeal Phase Impairments: Suspected delayed Swallow   Solid   GO   Solid: Not tested       Maxcine HamLaura Paiewonsky, M.A. CCC-SLP [phone]  Maxcine Hamaiewonsky, Talon Regala 05/27/2016,5:16 PM

## 2016-05-28 LAB — CULTURE, BLOOD (ROUTINE X 2)
Culture: NO GROWTH
Culture: NO GROWTH

## 2016-05-28 LAB — GLUCOSE, CAPILLARY
Glucose-Capillary: 101 mg/dL — ABNORMAL HIGH (ref 65–99)
Glucose-Capillary: 101 mg/dL — ABNORMAL HIGH (ref 65–99)

## 2016-05-28 NOTE — Progress Notes (Signed)
PROGRESS NOTE    Dale HollowCraig D Consuegra  WUJ:[id number] DOB: 11/25/1966 DOA: 05/19/2016 PCP: No primary care provider on file.    Brief Narrative:    50 year old male with h/o strep pneumonia meningitis, dysphagia, dementia and hospital course complicated by cardiac arrest and cardiogenic shock in March 2016, ARF, shock liver, adrenal insufficiency presents with acute encephalopathy.   Recent Events in the last 3 months;  3/14-4/6: Admitted with strep pneumo meningitis, c/b cardiac arrest, and cardiogenic shock (EF 20%, recovered to 50%) briefly on amiodarone, ARDS requiring prolonged intubation and trach (now removed), ARF, shock liver, and adrenal insufficiency. He was discharged on 4/6 with weekly Pen G IM until 4/10  4/16-5/1: Readmitted with fall, altered mental status. Repeat LP had no growth, TEE without vegetation, ultimately transitioned to ceftriaxone 2g BID with plans for >4 weeks IV antibiotics. During this hospitalization, he was planning for SNF placement until the day of discharge when the patient's brother asked to take him home (it appears that this wasn't verified with the patient's estranged but legally married wife).  Since discharge, patient has returned to the ED 4 times because of loss of PICC line, PICC requiring to be flushed, PICC coming out, or patient wandering unattended in the street (he has dementia since his meningitis).  5/24-5/26: Readmitted for placement to SNF, given additional week of rocephin and was supposed to follow up with ID before stopping rocephin.  On 6/15 patient came from NH, due to acute encephalopathy, seizure, . He was restarted on vancomycin and rocephin for encephalopathy and sepsis of unclear etiology .    Assessment & Plan:   Principal Problem:   Septic shock (HCC) Active Problems:   Acute encephalopathy   Meningitis, pneumococcal, recurrent   Cerebral septic emboli (HCC)   Dementia   Convulsions/seizures (HCC)   Dysphagia  Hypothermia   Acute respiratory failure with hypoxemia (HCC)   Acute Respiratory Failure ; unclear etiology.  Resolved not requiring oxygen.  Duuonebs, NT suctioning as needed.   Dysphagia on NG tube :  NG tube discontinued yesterday after SLP evaluation done and recommended to start dysphagia 1 diet with nectar thick liquids   does not need any G tube anymore.    Recurrent Pneumococcal  meningitis Plan was to discontinue rocephin by 5/26 and to be seen by ID prior to discontinuing the antibiotics.  But patient never followed up with ID. His rocephin was restarted on 6/15  Along with IV vancomycin on admission, for possible sepsis,. His blood cultures from 6/19 have been negative.  Discussed with Dr Drue SecondSnider over the phone, recommended stopping the antibiotic as cultures have been negative.  Stopped IV rocephin on 6/24.       Septic shock on admission: unclear etiology.  He was restarted on IV vancomycin and IV Rocephin on 6/15 and vancomycin discontinued on 6/19.  Rocephin was discontinued on 6/24 after discussing with ID.  Blood cultures drawn on 6/19 have been negative so far. If cultures  Repeat CXR shows resolving infection in the left mid and lower lung opacity.    Acute encephalopathy from ? sepsis and recurrent meningitis.  He at baseline is non verbal, can only give one word answers and follow simple commands.  He is very unsteady on his feet at baseline.  Neurology consulted for the episode of seizures he had at the facility prior to admission.  EEG done showed moderate generalized slowing of brain activity consistent with a global brain injury such as seen in  hypoxia, metabolic, toxic, or infectious etiologies.  Neurology recommended restarting his dose of depakote and keppra.  No seizures since restarting his meds.     Hyperkalemia: resolved.   Acute Kidney Injury: - probably from sepsis and dehydration.  - improved.    Protein calorie malnutrition: Dietary  recommendations.  RESTARTED DIET.    DVT prophylaxis: SCD's Code Status: FULL CODE Family Communication: none at bedside. Talked to wife over the phone on 6/23 and updated.  Disposition Plan: possible discharge back to SNF on Monday.    Consultants:   PCCM  Neurology    Procedures:  Blood Ctx x2 6/15 >> GPC >> Strep viridans  BC Reflex 6/15 >> Strep viridans  Urine Ctx 6/15 >> MRSA PCR 6/16 >> U. Strep Antigen 6/16 >> negative   Antimicrobials:  Vancomycin 6/15 >>6/19 Rocephin 6/15 >>  SIGNIFICANT EVENTS: 3/14 - 4/06 - Admit w/ Strep pneumo meningitis, cardiogenic shock & cardiac arrest. ARDS s/p trach & removal. 4/16 - 5/01 - Readmit after fall w/ AMS.  5/24 - 5/26 - Readmitted for SNF placement 6/15 - Admit from SNF w/ decreased mentation   LINES/TUBES: FOLEY 6/15 >> R DL PICC 1/61 >>  Subjective: Following commands.   Objective: Filed Vitals:   05/28/16 1200 05/28/16 1227 05/28/16 1512 05/28/16 1537  BP: 99/78 99/78  110/84  Pulse:  74  80  Temp:  97.6 F (36.4 C)  97.7 F (36.5 C)  TempSrc:  Oral  Oral  Resp: Height:      Weight:      SpO2:   98% 97%    Intake/Output Summary (Last 24 hours) at 05/28/16 1611 Last data filed at 05/28/16 1000  Gross per 24 hour  Intake   1960 ml  Output   1700 ml  Net    260 ml   Filed Weights   05/26/16 0500 05/27/16 0426 05/28/16 0417  Weight: 54.2 kg (119 lb 7.8 oz) 53.2 kg (117 lb 4.6 oz) 53 kg (116 lb 13.5 oz)    Examination:  General exam: Appears calm and comfortable  Respiratory system: Clear to auscultation. Respiratory effort normal. Cardiovascular system: S1 & S2 heard, RRR. No JVD, murmurs, rubs, gallops or clicks. No pedal edema. Gastrointestinal system: Abdomen is nondistended, soft and nontender. No organomegaly or masses felt. Normal bowel sounds heard. Central nervous system: Alert and following some commands. . Extremities: no pedal edema.        Data Reviewed: I have  personally reviewed following labs and imaging studies  CBC:  Recent Labs Lab 05/22/16 0500 05/23/16 0430 05/24/16 0525 05/25/16 0319 05/27/16 0430  WBC 8.5 9.9 12.6* 10.2 8.0  HGB 9.1* 8.8* 8.5* 8.5* 9.0*  HCT 27.1* 26.3* 26.0* 25.9* 28.1*  MCV 83.9 84.6 87.2 86.0 88.1  PLT 65* 67* 88* 94* 214   Basic Metabolic Panel:  Recent Labs Lab 05/21/16 1658 05/22/16 0500 05/23/16 0430 05/24/16 0525 05/25/16 0319 05/27/16 0430  NA  --  139 138 140 140 140  K  --  4.3 5.1 4.6 5.0 4.6  CL  --  109 106 105 103 102  CO2  --  32  GLUCOSE  --  110* 107* 117* 87 99  BUN  --  30*  CREATININE  --  1.14 1.22 0.94 0.91 1.03  CALCIUM  --  8.6* 8.7* 9.0 9.0 9.4  MG 2.4 2.2 2.2 2.2 2.2  --   PHOS  5.1* 4.9* 6.0* 5.6* 6.5*  --    GFR: Estimated Creatinine Clearance: 63.6 mL/min (by C-G formula based on Cr of 1.03). Liver Function Tests: No results for input(s): AST, ALT, ALKPHOS, BILITOT, PROT, ALBUMIN in the last 168 hours. No results for input(s): LIPASE, AMYLASE in the last 168 hours. No results for input(s): AMMONIA in the last 168 hours. Coagulation Profile: No results for input(s): INR, PROTIME in the last 168 hours. Cardiac Enzymes: No results for input(s): CKTOTAL, CKMB, CKMBINDEX, TROPONINI in the last 168 hours. BNP (last 3 results) No results for input(s): PROBNP in the last 8760 hours. HbA1C: No results for input(s): HGBA1C in the last 72 hours. CBG:  Recent Labs Lab 05/27/16 1246 05/27/16 1637 05/27/16 1957 05/28/16 0030 05/28/16 0526  GLUCAP 109* 119* 84 101* 101*   Lipid Profile: No results for input(s): CHOL, HDL, LDLCALC, TRIG, CHOLHDL, LDLDIRECT in the last 72 hours. Thyroid Function Tests: No results for input(s): TSH, T4TOTAL, FREET4, T3FREE, THYROIDAB in the last 72 hours. Anemia Panel: No results for input(s): VITAMINB12, FOLATE, FERRITIN, TIBC, IRON, RETICCTPCT in the last 72 hours. Sepsis Labs:  Recent Labs Lab  05/22/16 0500  PROCALCITON 1.18    Recent Results (from the past 240 hour(s))  Blood Culture (routine x 2)     Status: None   Collection Time: 05/19/16  3:05 PM  Result Value Ref Range Status   Specimen Description BLOOD RIGHT PICC LINE  Final   Special Requests BOTTLES DRAWN AEROBIC AND ANAEROBIC 5CC  Final   Culture NO GROWTH 5 DAYS  Final   Report Status 05/24/2016 FINAL  Final  Blood Culture (routine x 2)     Status: Abnormal   Collection Time: 05/19/16  3:18 PM  Result Value Ref Range Status   Specimen Description BLOOD LEFT HAND  Final   Special Requests BOTTLES DRAWN AEROBIC AND ANAEROBIC 5CC  Final   Culture  Setup Time   Final    GRAM POSITIVE COCCI IN CHAINS AEROBIC BOTTLE ONLY CRITICAL RESULT CALLED TO, READ BACK BY AND VERIFIED WITH: M MACCIA 05/20/16 @ 1009 M VESTAL    Culture (A)  Final    VIRIDANS STREPTOCOCCUS THE SIGNIFICANCE OF ISOLATING THIS ORGANISM FROM A SINGLE SET OF BLOOD CULTURES WHEN MULTIPLE SETS ARE DRAWN IS UNCERTAIN. PLEASE NOTIFY THE MICROBIOLOGY DEPARTMENT WITHIN ONE WEEK IF SPECIATION AND SENSITIVITIES ARE REQUIRED.    Report Status 05/22/2016 FINAL  Final  Blood Culture ID Panel (Reflexed)     Status: Abnormal   Collection Time: 05/19/16  3:18 PM  Result Value Ref Range Status   Enterococcus species NOT DETECTED NOT DETECTED Final   Vancomycin resistance NOT DETECTED NOT DETECTED Final   Listeria monocytogenes NOT DETECTED NOT DETECTED Final   Staphylococcus species NOT DETECTED NOT DETECTED Final   Staphylococcus aureus NOT DETECTED NOT DETECTED Final   Methicillin resistance NOT DETECTED NOT DETECTED Final   Streptococcus species DETECTED (A) NOT DETECTED Final    Comment: CRITICAL RESULT CALLED TO, READ BACK BY AND VERIFIED WITH: Lytle Butte 05/20/16 @ 1009 M VESTAL    Streptococcus agalactiae NOT DETECTED NOT DETECTED Final   Streptococcus pneumoniae NOT DETECTED NOT DETECTED Final   Streptococcus pyogenes NOT DETECTED NOT DETECTED Final    Acinetobacter baumannii NOT DETECTED NOT DETECTED Final   Enterobacteriaceae species NOT DETECTED NOT DETECTED Final   Enterobacter cloacae complex NOT DETECTED NOT DETECTED Final   Escherichia coli NOT DETECTED NOT DETECTED Final   Klebsiella oxytoca NOT DETECTED NOT  DETECTED Final   Klebsiella pneumoniae NOT DETECTED NOT DETECTED Final   Proteus species NOT DETECTED NOT DETECTED Final   Serratia marcescens NOT DETECTED NOT DETECTED Final   Carbapenem resistance NOT DETECTED NOT DETECTED Final   Haemophilus influenzae NOT DETECTED NOT DETECTED Final   Neisseria meningitidis NOT DETECTED NOT DETECTED Final   Pseudomonas aeruginosa NOT DETECTED NOT DETECTED Final   Candida albicans NOT DETECTED NOT DETECTED Final   Candida glabrata NOT DETECTED NOT DETECTED Final   Candida krusei NOT DETECTED NOT DETECTED Final   Candida parapsilosis NOT DETECTED NOT DETECTED Final   Candida tropicalis NOT DETECTED NOT DETECTED Final  Urine culture     Status: Abnormal   Collection Time: 05/19/16  6:40 PM  Result Value Ref Range Status   Specimen Description URINE, CATHETERIZED  Final   Special Requests NONE  Final   Culture <10,000 COLONIES/mL INSIGNIFICANT GROWTH (A)  Final   Report Status 05/21/2016 FINAL  Final  C difficile quick screen w PCR reflex     Status: Abnormal   Collection Time: 05/21/16  5:37 AM  Result Value Ref Range Status   C Diff antigen POSITIVE (A) NEGATIVE Final   C Diff toxin NEGATIVE NEGATIVE Final   C Diff interpretation   Final    C. difficile present, but toxin not detected. This indicates colonization. In most cases, this does not require treatment. If patient has signs and symptoms consistent with colitis, consider treatment. Requires ENTERIC precautions.  Culture, blood (Routine X 2) w Reflex to ID Panel     Status: None (Preliminary result)   Collection Time: 05/23/16  2:29 PM  Result Value Ref Range Status   Specimen Description BLOOD LEFT HAND  Final   Special  Requests IN PEDIATRIC BOTTLE 3CC  Final   Culture NO GROWTH 4 DAYS  Final   Report Status PENDING  Incomplete  Culture, blood (Routine X 2) w Reflex to ID Panel     Status: None (Preliminary result)   Collection Time: 05/23/16  2:38 PM  Result Value Ref Range Status   Specimen Description BLOOD LEFT HAND  Final   Special Requests IN PEDIATRIC BOTTLE  1CC  Final   Culture NO GROWTH 4 DAYS  Final   Report Status PENDING  Incomplete  Urine culture     Status: Abnormal   Collection Time: 05/25/16  2:09 AM  Result Value Ref Range Status   Specimen Description URINE, CATHETERIZED  Final   Special Requests NONE  Final   Culture <10,000 COLONIES/mL INSIGNIFICANT GROWTH (A)  Final   Report Status 05/26/2016 FINAL  Final         Radiology Studies: Ct Abdomen Wo Contrast  05/27/2016  CLINICAL DATA:  Septic shock and anatomic assessment prior to possible percutaneous gastrostomy tube placement. EXAM: CT ABDOMEN WITHOUT CONTRAST TECHNIQUE: Multidetector CT imaging of the abdomen was performed following the standard protocol without IV contrast. COMPARISON:  None. FINDINGS: Lower chest:  Left basilar atelectasis present. Hepatobiliary: Unremarkable unenhanced appearance of the liver and gallbladder. Pancreas: Unremarkable unenhanced appearance of the pancreas. Spleen: Unremarkable spleen. Adrenals/Urinary Tract: No evidence of hydronephrosis or urinary tract calculi. Stomach/Bowel: A feeding tube is present which extends through the stomach and duodenum and into the proximal jejunum. The stomach is very high in location and transversely oriented with much of the stomach posterior to the left lobe of the liver. No evidence of bowel obstruction, free air or abscess. Vascular/Lymphatic: No enlarged lymph nodes are identified. Sparse calcified  plaque of the abdominal aorta present consistent with atherosclerosis. Other: No hernias. Musculoskeletal: Unremarkable visualized bony structures. IMPRESSION: 1.  Transverse stomach with much of the stomach posterior to the left lobe of the liver. Although this may make percutaneous gastrostomy tube placement more difficult, with insufflation of the stomach, there may be a safe window present to perform gastrostomy tube placement. 2. Aortic atherosclerosis. 3. Current feeding tube extends into the proximal jejunum. Electronically Signed   By: Irish Lack M.D.   On: 05/27/2016 16:41   Dg Chest Port 1 View  05/27/2016  CLINICAL DATA:  50 year old male with fever, acute encephalopathy, seizure. Streptococcus meningitis in March, with hospital course at that time complicated by cardiac arrest and cardiogenic shock. Initial encounter. EXAM: PORTABLE CHEST 1 VIEW COMPARISON:  05/24/2016 and earlier. FINDINGS: Portable AP semi upright view at 1117 hours. Feeding tube in place, appears to loop in the stomach with tip pointed slightly retrograde at the proximal gastric body (arrow). Right PICC line remains in place. Asymmetric reticulonodular opacity in the left mid and lower lung has mildly regressed. Upper lobes remain clear. Right lung remains clear. No pneumothorax, pulmonary edema, pleural effusion or areas of worsening ventilation. Mediastinal contours remain normal. IMPRESSION: 1. Regressed but not resolved left mid and lower lung reticulonodular opacity compatible with improving left lung infection. 2. No pleural effusion or new cardiopulmonary abnormality. 3. Feeding tube looped in the stomach. Electronically Signed   By: Odessa Fleming M.D.   On: 05/27/2016 11:33        Scheduled Meds: . antiseptic oral rinse  7 mL Mouth Rinse q12n4p  . atorvastatin  20 mg Per Tube q1800  . chlorhexidine  15 mL Mouth Rinse BID  . folic acid  1 mg Per Tube Daily  . ipratropium-albuterol  3 mL Nebulization QID  . levETIRAcetam  1,000 mg Per Tube BID  . thiamine  100 mg Per Tube Daily  . Valproate Sodium  250 mg Per Tube Q8H   Continuous Infusions:     LOS: 9 days    Time  spent: 30 minutes    Usman Millett, MD [facility] Pager [phone]   If 7PM-7AM, please contact night-coverage [email or web address] Password [email or web address] 05/28/2016, 4:11 PM

## 2016-05-29 ENCOUNTER — Inpatient Hospital Stay (HOSPITAL_COMMUNITY): Payer: Medicaid Other

## 2016-05-29 LAB — COMPREHENSIVE METABOLIC PANEL
ALBUMIN: 2.5 g/dL — AB (ref 3.5–5.0)
ALT: 61 U/L (ref 17–63)
AST: 26 U/L (ref 15–41)
Alkaline Phosphatase: 72 U/L (ref 38–126)
Anion gap: 9 (ref 5–15)
BILIRUBIN TOTAL: 0.1 mg/dL — AB (ref 0.3–1.2)
BUN: 27 mg/dL — AB (ref 6–20)
CO2: 30 mmol/L (ref 22–32)
CREATININE: 0.72 mg/dL (ref 0.61–1.24)
Calcium: 9.5 mg/dL (ref 8.9–10.3)
Chloride: 103 mmol/L (ref 101–111)
GFR calc Af Amer: >60 mL/min (ref >60)
GLUCOSE: 76 mg/dL (ref 65–99)
POTASSIUM: 4.2 mmol/L (ref 3.5–5.1)
Sodium: 142 mmol/L (ref 135–145)
Total Protein: 6 g/dL — ABNORMAL LOW (ref 6.5–8.1)

## 2016-05-29 LAB — CBC
HEMATOCRIT: 29.3 % — AB (ref 39.0–52.0)
Hemoglobin: 9.3 g/dL — ABNORMAL LOW (ref 13.0–17.0)
MCH: 27.9 pg (ref 26.0–34.0)
MCHC: 31.7 g/dL (ref 30.0–36.0)
MCV: 88 fL (ref 78.0–100.0)
PLATELETS: 439 10*3/uL — AB (ref 150–400)
RBC: 3.33 MIL/uL — AB (ref 4.22–5.81)
RDW: 15.1 % (ref 11.5–15.5)
WBC: 8.2 10*3/uL (ref 4.0–10.5)

## 2016-05-29 LAB — URINALYSIS, ROUTINE W REFLEX MICROSCOPIC
BILIRUBIN URINE: NEGATIVE
GLUCOSE, UA: NEGATIVE mg/dL
HGB URINE DIPSTICK: NEGATIVE
Ketones, ur: NEGATIVE mg/dL
Leukocytes, UA: NEGATIVE
Nitrite: NEGATIVE
PH: 6 (ref 5.0–8.0)
Protein, ur: NEGATIVE mg/dL
SPECIFIC GRAVITY, URINE: 1.019 (ref 1.005–1.030)

## 2016-05-29 LAB — LACTIC ACID, PLASMA: Lactic Acid, Venous: 1 mmol/L (ref 0.5–2.0)

## 2016-05-29 LAB — PROCALCITONIN: PROCALCITONIN: 0.12 ng/mL

## 2016-05-29 MED ORDER — PIPERACILLIN-TAZOBACTAM 3.375 G IVPB
3.3750 g | Freq: Three times a day (TID) | INTRAVENOUS | Status: DC
  Administered 2016-05-29 – 2016-06-01 (×9): 3.375 g via INTRAVENOUS
  Filled 2016-05-29 (×12): qty 50

## 2016-05-29 MED ORDER — SODIUM CHLORIDE 0.9 % IV BOLUS (SEPSIS)
1000.0000 mL | Freq: Once | INTRAVENOUS | Status: AC
  Administered 2016-05-29: 1000 mL via INTRAVENOUS

## 2016-05-29 NOTE — Progress Notes (Deleted)
05/29/2016 Nurse tech notify RN patient

## 2016-05-29 NOTE — Progress Notes (Signed)
PROGRESS NOTE    Dale [name]  RUE:[id number] DOB: 1966/01/30 DOA: 05/19/2016 PCP: No primary care provider on file.    Brief Narrative:    50 year old male with h/o strep pneumonia meningitis, dysphagia, dementia and hospital course complicated by cardiac arrest and cardiogenic shock in March 2016, ARF, shock liver, adrenal insufficiency presents with acute encephalopathy.   Recent Events in the last 3 months;  3/14-4/6: Admitted with strep pneumo meningitis, c/b cardiac arrest, and cardiogenic shock (EF 20%, recovered to 50%) briefly on amiodarone, ARDS requiring prolonged intubation and trach (now removed), ARF, shock liver, and adrenal insufficiency. He was discharged on 4/6 with weekly Pen G IM until 4/10  4/16-5/1: Readmitted with fall, altered mental status. Repeat LP had no growth, TEE without vegetation, ultimately transitioned to ceftriaxone 2g BID with plans for >4 weeks IV antibiotics. During this hospitalization, he was planning for SNF placement until the day of discharge when the patient's brother asked to take him home (it appears that this wasn't verified with the patient's estranged but legally married wife).  Since discharge, patient has returned to the ED 4 times because of loss of PICC line, PICC requiring to be flushed, PICC coming out, or patient wandering unattended in the street (he has dementia since his meningitis).  5/24-5/26: Readmitted for placement to SNF, given additional week of rocephin and was supposed to follow up with ID before stopping rocephin.  On 6/15 patient came from NH, due to acute encephalopathy, seizure, . He was restarted on vancomycin and rocephin for encephalopathy and sepsis of unclear etiology .   6/21 patient transferred to [facility] service frm PCCM service.    Assessment & Plan:   Principal Problem:   Septic shock (HCC) Active Problems:   Acute encephalopathy   Meningitis, pneumococcal, recurrent   Cerebral septic emboli (HCC)  Dementia   Convulsions/seizures (HCC)   Dysphagia   Hypothermia   Acute respiratory failure with hypoxemia (HCC)   Acute Respiratory Failure ; unclear etiology.  Resolved not requiring oxygen.  Duuonebs, NT suctioning as needed.   Dysphagia on NG tube :  NG tube discontinued on 6/24 after SLP evaluation done and recommended to start dysphagia 1 diet with nectar thick liquids    He does not need any G tube anymore.     Recurrent Pneumococcal  meningitis Plan was to discontinue rocephin by 5/26 and to be seen by ID prior to discontinuing the antibiotics.  But patient never followed up with ID. His rocephin was restarted on 6/15  Along with IV vancomycin on admission, for possible sepsis,. His blood cultures from 6/19 have been negative.  Discussed with Dr Drue Second over the phone, recommended stopping the antibiotic as cultures have been negative.  Stopped IV rocephin on 6/24.       Septic shock on admission: unclear etiology.  He was restarted on IV vancomycin and IV Rocephin on 6/15 and vancomycin discontinued on 6/19.  Rocephin was discontinued on 6/24 after discussing with ID.  Blood cultures drawn on 6/19 have been negative so far. If cultures  Repeat CXR shows resolving infection in the left mid and lower lung opacity.    Acute encephalopathy from ? sepsis and recurrent meningitis.  He at baseline is non verbal, can only give one word answers and follow simple commands.  He is very unsteady on his feet at baseline.  Neurology consulted for the episode of seizures he had at the facility prior to admission.  EEG done showed moderate  generalized slowing of brain activity consistent with a global brain injury such as seen in hypoxia, metabolic, toxic, or infectious etiologies.  Neurology recommended restarting his dose of depakote and keppra.  No seizures since restarting his meds.  More confused and lethargic today when compared to yesterday.  Getting septic work up underway.     Hypothermia, more lethargic today, : bp borderline. Ordered fluid bolus NS 1 LIT today. Blood cultures ordered.  Lactic acid, pro calcitonin,labs, portable CXR, and UA.  Since we started diet yesterday,suspect he might have aspirated .     Hyperkalemia: resolved.   Acute Kidney Injury: - probably from sepsis and dehydration.  - improved.    Protein calorie malnutrition: Dietary recommendations.  RESTARTED DIET.    DVT prophylaxis: SCD's Code Status: FULL CODE Family Communication: none at bedside. Talked to wife over the phone on 6/23 and updated.  Disposition Plan: possible discharge back to SNF if stable.    Consultants:   PCCM  Neurology    Procedures:  Blood Ctx x2 6/15 >> GPC >> Strep viridans  BC Reflex 6/15 >> Strep viridans  Urine Ctx 6/15 >> MRSA PCR 6/16 >> U. Strep Antigen 6/16 >> negative   Antimicrobials:  Vancomycin 6/15 >>6/19 Rocephin 6/15 >>  SIGNIFICANT EVENTS: 3/14 - 4/06 - Admit w/ Strep pneumo meningitis, cardiogenic shock & cardiac arrest. ARDS s/p trach & removal. 4/16 - 5/01 - Readmit after fall w/ AMS.  5/24 - 5/26 - Readmitted for SNF placement 6/15 - Admit from SNF w/ decreased mentation   LINES/TUBES: FOLEY 6/15 >> R DL PICC 4/09 >>  Subjective: Lethargic and sleepy.  Objective: Filed Vitals:   05/29/16 0400 05/29/16 0856 05/29/16 0907 05/29/16 1110  BP: 88/69 95/79    Pulse: 56 62    Temp: 98 F (36.7 C) 94.1 F (34.5 C)  94.3 F (34.6 C)  TempSrc: Oral Rectal  Rectal  Resp: 8 9    Height:      Weight: 55.8 kg (123 lb 0.3 oz)     SpO2: 99% 98% 98%     Intake/Output Summary (Last 24 hours) at 05/29/16 1138 Last data filed at 05/28/16 2100  Gross per 24 hour  Intake    240 ml  Output    675 ml  Net   -435 ml   Filed Weights   05/27/16 0426 05/28/16 0417 05/29/16 0400  Weight: 53.2 kg (117 lb 4.6 oz) 53 kg (116 lb 13.5 oz) 55.8 kg (123 lb 0.3 oz)    Examination:  General exam: Appears more  lethargic today.  Respiratory system: Clear to auscultation. Respiratory effort normal. Cardiovascular system: S1 & S2 heard, RRR. No JVD, murmurs, rubs, gallops or clicks. No pedal edema. Gastrointestinal system: Abdomen is nondistended, soft and nontender. No organomegaly or masses felt. Normal bowel sounds heard. Central nervous system: not following commands today. . Extremities: no pedal edema.        Data Reviewed: I have personally reviewed following labs and imaging studies  CBC:  Recent Labs Lab 05/23/16 0430 05/24/16 0525 05/25/16 0319 05/27/16 0430  WBC 9.9 12.6* 10.2 8.0  HGB 8.8* 8.5* 8.5* 9.0*  HCT 26.3* 26.0* 25.9* 28.1*  MCV 84.6 87.2 86.0 88.1  PLT 67* 88* 94* 214   Basic Metabolic Panel:  Recent Labs Lab 05/23/16 0430 05/24/16 0525 05/25/16 0319 05/27/16 0430  NA 138 140 140 140  K 5.1 4.6 5.0 4.6  CL 106 105 103 102  CO2 23 26 29  32  GLUCOSE 107* 117* 87 99  BUN 18 19 20  30*  CREATININE 1.22 0.94 0.91 1.03  CALCIUM 8.7* 9.0 9.0 9.4  MG 2.2 2.2 2.2  --   PHOS 6.0* 5.6* 6.5*  --    GFR: Estimated Creatinine Clearance: 67 mL/min (by C-G formula based on Cr of 1.03). Liver Function Tests: No results for input(s): AST, ALT, ALKPHOS, BILITOT, PROT, ALBUMIN in the last 168 hours. No results for input(s): LIPASE, AMYLASE in the last 168 hours. No results for input(s): AMMONIA in the last 168 hours. Coagulation Profile: No results for input(s): INR, PROTIME in the last 168 hours. Cardiac Enzymes: No results for input(s): CKTOTAL, CKMB, CKMBINDEX, TROPONINI in the last 168 hours. BNP (last 3 results) No results for input(s): PROBNP in the last 8760 hours. HbA1C: No results for input(s): HGBA1C in the last 72 hours. CBG:  Recent Labs Lab 05/27/16 1246 05/27/16 1637 05/27/16 1957 05/28/16 0030 05/28/16 0526  GLUCAP 109* 119* 84 101* 101*   Lipid Profile: No results for input(s): CHOL, HDL, LDLCALC, TRIG, CHOLHDL, LDLDIRECT in the last 72  hours. Thyroid Function Tests: No results for input(s): TSH, T4TOTAL, FREET4, T3FREE, THYROIDAB in the last 72 hours. Anemia Panel: No results for input(s): VITAMINB12, FOLATE, FERRITIN, TIBC, IRON, RETICCTPCT in the last 72 hours. Sepsis Labs: No results for input(s): PROCALCITON, LATICACIDVEN in the last 168 hours.  Recent Results (from the past 240 hour(s))  Blood Culture (routine x 2)     Status: None   Collection Time: 05/19/16  3:05 PM  Result Value Ref Range Status   Specimen Description BLOOD RIGHT PICC LINE  Final   Special Requests BOTTLES DRAWN AEROBIC AND ANAEROBIC 5CC  Final   Culture NO GROWTH 5 DAYS  Final   Report Status 05/24/2016 FINAL  Final  Blood Culture (routine x 2)     Status: Abnormal   Collection Time: 05/19/16  3:18 PM  Result Value Ref Range Status   Specimen Description BLOOD LEFT HAND  Final   Special Requests BOTTLES DRAWN AEROBIC AND ANAEROBIC 5CC  Final   Culture  Setup Time   Final    GRAM POSITIVE COCCI IN CHAINS AEROBIC BOTTLE ONLY CRITICAL RESULT CALLED TO, READ BACK BY AND VERIFIED WITH: M MACCIA 05/20/16 @ 1009 M VESTAL    Culture (A)  Final    VIRIDANS STREPTOCOCCUS THE SIGNIFICANCE OF ISOLATING THIS ORGANISM FROM A SINGLE SET OF BLOOD CULTURES WHEN MULTIPLE SETS ARE DRAWN IS UNCERTAIN. PLEASE NOTIFY THE MICROBIOLOGY DEPARTMENT WITHIN ONE WEEK IF SPECIATION AND SENSITIVITIES ARE REQUIRED.    Report Status 05/22/2016 FINAL  Final  Blood Culture ID Panel (Reflexed)     Status: Abnormal   Collection Time: 05/19/16  3:18 PM  Result Value Ref Range Status   Enterococcus species NOT DETECTED NOT DETECTED Final   Vancomycin resistance NOT DETECTED NOT DETECTED Final   Listeria monocytogenes NOT DETECTED NOT DETECTED Final   Staphylococcus species NOT DETECTED NOT DETECTED Final   Staphylococcus aureus NOT DETECTED NOT DETECTED Final   Methicillin resistance NOT DETECTED NOT DETECTED Final   Streptococcus species DETECTED (A) NOT DETECTED Final      Comment: CRITICAL RESULT CALLED TO, READ BACK BY AND VERIFIED WITH: M MACCIA 05/20/16 @ 1009 M VESTAL    Streptococcus agalactiae NOT DETECTED NOT DETECTED Final   Streptococcus pneumoniae NOT DETECTED NOT DETECTED Final   Streptococcus pyogenes NOT DETECTED NOT DETECTED Final   Acinetobacter baumannii NOT DETECTED NOT DETECTED Final   Enterobacteriaceae  species NOT DETECTED NOT DETECTED Final   Enterobacter cloacae complex NOT DETECTED NOT DETECTED Final   Escherichia coli NOT DETECTED NOT DETECTED Final   Klebsiella oxytoca NOT DETECTED NOT DETECTED Final   Klebsiella pneumoniae NOT DETECTED NOT DETECTED Final   Proteus species NOT DETECTED NOT DETECTED Final   Serratia marcescens NOT DETECTED NOT DETECTED Final   Carbapenem resistance NOT DETECTED NOT DETECTED Final   Haemophilus influenzae NOT DETECTED NOT DETECTED Final   Neisseria meningitidis NOT DETECTED NOT DETECTED Final   Pseudomonas aeruginosa NOT DETECTED NOT DETECTED Final   Candida albicans NOT DETECTED NOT DETECTED Final   Candida glabrata NOT DETECTED NOT DETECTED Final   Candida krusei NOT DETECTED NOT DETECTED Final   Candida parapsilosis NOT DETECTED NOT DETECTED Final   Candida tropicalis NOT DETECTED NOT DETECTED Final  Urine culture     Status: Abnormal   Collection Time: 05/19/16  6:40 PM  Result Value Ref Range Status   Specimen Description URINE, CATHETERIZED  Final   Special Requests NONE  Final   Culture <10,000 COLONIES/mL INSIGNIFICANT GROWTH (A)  Final   Report Status 05/21/2016 FINAL  Final  C difficile quick screen w PCR reflex     Status: Abnormal   Collection Time: 05/21/16  5:37 AM  Result Value Ref Range Status   C Diff antigen POSITIVE (A) NEGATIVE Final   C Diff toxin NEGATIVE NEGATIVE Final   C Diff interpretation   Final    C. difficile present, but toxin not detected. This indicates colonization. In most cases, this does not require treatment. If patient has signs and symptoms  consistent with colitis, consider treatment. Requires ENTERIC precautions.  Culture, blood (Routine X 2) w Reflex to ID Panel     Status: None   Collection Time: 05/23/16  2:29 PM  Result Value Ref Range Status   Specimen Description BLOOD LEFT HAND  Final   Special Requests IN PEDIATRIC BOTTLE 3CC  Final   Culture NO GROWTH 5 DAYS  Final   Report Status 05/28/2016 FINAL  Final  Culture, blood (Routine X 2) w Reflex to ID Panel     Status: None   Collection Time: 05/23/16  2:38 PM  Result Value Ref Range Status   Specimen Description BLOOD LEFT HAND  Final   Special Requests IN PEDIATRIC BOTTLE  1CC  Final   Culture NO GROWTH 5 DAYS  Final   Report Status 05/28/2016 FINAL  Final  Urine culture     Status: Abnormal   Collection Time: 05/25/16  2:09 AM  Result Value Ref Range Status   Specimen Description URINE, CATHETERIZED  Final   Special Requests NONE  Final   Culture <10,000 COLONIES/mL INSIGNIFICANT GROWTH (A)  Final   Report Status 05/26/2016 FINAL  Final         Radiology Studies: Ct Abdomen Wo Contrast  05/27/2016  CLINICAL DATA:  Septic shock and anatomic assessment prior to possible percutaneous gastrostomy tube placement. EXAM: CT ABDOMEN WITHOUT CONTRAST TECHNIQUE: Multidetector CT imaging of the abdomen was performed following the standard protocol without IV contrast. COMPARISON:  None. FINDINGS: Lower chest:  Left basilar atelectasis present. Hepatobiliary: Unremarkable unenhanced appearance of the liver and gallbladder. Pancreas: Unremarkable unenhanced appearance of the pancreas. Spleen: Unremarkable spleen. Adrenals/Urinary Tract: No evidence of hydronephrosis or urinary tract calculi. Stomach/Bowel: A feeding tube is present which extends through the stomach and duodenum and into the proximal jejunum. The stomach is very high in location and transversely oriented with  much of the stomach posterior to the left lobe of the liver. No evidence of bowel obstruction, free  air or abscess. Vascular/Lymphatic: No enlarged lymph nodes are identified. Sparse calcified plaque of the abdominal aorta present consistent with atherosclerosis. Other: No hernias. Musculoskeletal: Unremarkable visualized bony structures. IMPRESSION: 1. Transverse stomach with much of the stomach posterior to the left lobe of the liver. Although this may make percutaneous gastrostomy tube placement more difficult, with insufflation of the stomach, there may be a safe window present to perform gastrostomy tube placement. 2. Aortic atherosclerosis. 3. Current feeding tube extends into the proximal jejunum. Electronically Signed   By: Irish LackGlenn  Yamagata M.D.   On: 05/27/2016 16:41        Scheduled Meds: . antiseptic oral rinse  7 mL Mouth Rinse q12n4p  . atorvastatin  20 mg Per Tube q1800  . chlorhexidine  15 mL Mouth Rinse BID  . folic acid  1 mg Per Tube Daily  . ipratropium-albuterol  3 mL Nebulization QID  . levETIRAcetam  1,000 mg Per Tube BID  . sodium chloride  1,000 mL Intravenous Once  . thiamine  100 mg Per Tube Daily  . Valproate Sodium  250 mg Per Tube Q8H   Continuous Infusions:     LOS: 10 days    Time spent: 30 minutes    Kamron Portee, MD [facility] Pager [phone]   If 7PM-7AM, please contact night-coverage [email or web address] Password [email or web address] 05/29/2016, 11:38 AM

## 2016-05-29 NOTE — Progress Notes (Signed)
05/29/2016 Patient temperature at 0856 was 94.1 Dr Blake DivineAkula was made aware and order was given for warming blanket. [facility] Simons RN.

## 2016-05-29 NOTE — Progress Notes (Signed)
05/29/2016 Nurse tech notify patient temperature at 1700 was 102.3. Dr Blake DivineAkula was made aware via amion. Temperature was rechecked at 1820 it was 100.0. Patient was given tylenol 650 mg at 1822. [facility] Ki[facility] Hartis RN.

## 2016-05-30 ENCOUNTER — Inpatient Hospital Stay (HOSPITAL_COMMUNITY): Payer: Medicaid Other

## 2016-05-30 MED ORDER — RESOURCE THICKENUP CLEAR PO POWD
ORAL | Status: DC | PRN
  Filled 2016-05-30: qty 125

## 2016-05-30 NOTE — Progress Notes (Signed)
MBSS complete. Full report located under chart review in imaging section.  Easter Kennebrew MA, CCC-SLP [phone]   

## 2016-05-30 NOTE — Progress Notes (Signed)
PROGRESS NOTE    Dale [name]  ZOX:[id number] DOB: [birth year] DOA: 05/19/2016 PCP: No primary care provider on file.    Brief Narrative:    50 year old male with h/o strep pneumonia meningitis, dysphagia, dementia and hospital course complicated by cardiac arrest and cardiogenic shock in March 2016, ARF, shock liver, adrenal insufficiency presents with acute encephalopathy.   Recent Events in the last 3 months;  3/14-4/6: Admitted with strep pneumo meningitis, c/b cardiac arrest, and cardiogenic shock (EF 20%, recovered to 50%) briefly on amiodarone, ARDS requiring prolonged intubation and trach (now removed), ARF, shock liver, and adrenal insufficiency. He was discharged on 4/6 with weekly Pen G IM until 4/10  4/16-5/1: Readmitted with fall, altered mental status. Repeat LP had no growth, TEE without vegetation, ultimately transitioned to ceftriaxone 2g BID with plans for >4 weeks IV antibiotics. During this hospitalization, he was planning for SNF placement until the day of discharge when the patient's brother asked to take him home (it appears that this wasn't verified with the patient's estranged but legally married wife).  Since discharge, patient has returned to the ED 4 times because of loss of PICC line, PICC requiring to be flushed, PICC coming out, or patient wandering unattended in the street (he has dementia since his meningitis).  5/24-5/26: Readmitted for placement to SNF, given additional week of rocephin and was supposed to follow up with ID before stopping rocephin.  On 6/15 patient came from NH, due to acute encephalopathy, seizure, . He was restarted on vancomycin and rocephin for encephalopathy and sepsis of unclear etiology .   6/21 patient transferred to [facility] service frm PCCM service.    Assessment & Plan:   Principal Problem:   Septic shock (HCC) Active Problems:   Acute encephalopathy   Meningitis, pneumococcal, recurrent   Cerebral septic emboli (HCC)  Dementia   Convulsions/seizures (HCC)   Dysphagia   Hypothermia   Acute respiratory failure with hypoxemia (HCC)   Acute Respiratory Failure ; unclear etiology.  Resolved not requiring oxygen.  Duuonebs, NT suctioning as needed.   Dysphagia on NG tube :  NG tube discontinued on 6/24 after SLP evaluation done and recommended to start dysphagia 1 diet with nectar thick liquids    He does not need any G tube anymore.  MBS done today as he is more awake and advanced to dysphagia 2 diet with honey thick liquids.     Recurrent Pneumococcal  meningitis Plan was to discontinue rocephin by 5/26 and to be seen by ID prior to discontinuing the antibiotics.  But patient never followed up with ID. His rocephin was restarted on 6/15  Along with IV vancomycin on admission, for possible sepsis,. His blood cultures from 6/19 have been negative.  Discussed with Dr Drue Second over the phone, recommended stopping the antibiotic as cultures have been negative.  Stopped IV rocephin on 6/24.       Septic shock on admission: unclear etiology.  He was restarted on IV vancomycin and IV Rocephin on 6/15 and vancomycin discontinued on 6/19.  Rocephin was discontinued on 6/24 after discussing with ID.  Blood cultures drawn on 6/19 have been negative so far. If cultures  Repeat CXR shows resolving infection in the left mid and lower lung opacity.    Acute encephalopathy from ? sepsis and recurrent meningitis.  He at baseline is non verbal, can only give one word answers and follow simple commands.  He is very unsteady on his feet at baseline.  Neurology  consulted for the episode of seizures he had at the facility prior to admission.  EEG done showed moderate generalized slowing of brain activity consistent with a global brain injury such as seen in hypoxia, metabolic, toxic, or infectious etiologies.  Neurology recommended restarting his dose of depakote and keppra.  No seizures since restarting his meds.      Hypothermia, more lethargic on 6/25 bp borderline. Ordered fluid bolus NS 1 LIT today. Blood cultures ordered.  Lactic acid, pro calcitonin,labs, portable CXR, and UA ordered.  CXR shows worsening opacification, suspect aspiration , started the patient on IV zosyn.     Hyperkalemia: resolved.   Acute Kidney Injury: - probably from sepsis and dehydration.  - improved.    Protein calorie malnutrition: Dietary recommendations.  RESTARTED DIET.    DVT prophylaxis: SCD's Code Status: FULL CODE Family Communication: none at bedside. Talked to wife over the phone on 6/23 and updated.  Disposition Plan: possible discharge back to SNF in am if stable.    Consultants:   PCCM  Neurology    Procedures:  Blood Ctx x2 6/15 >> GPC >> Strep viridans  BC Reflex 6/15 >> Strep viridans  Urine Ctx 6/15 >> MRSA PCR 6/16 >> U. Strep Antigen 6/16 >> negative   Antimicrobials:  Vancomycin 6/15 >>6/19 Rocephin 6/15 >>  SIGNIFICANT EVENTS: 3/14 - 4/06 - Admit w/ Strep pneumo meningitis, cardiogenic shock & cardiac arrest. ARDS s/p trach & removal. 4/16 - 5/01 - Readmit after fall w/ AMS.  5/24 - 5/26 - Readmitted for SNF placement 6/15 - Admit from SNF w/ decreased mentation   LINES/TUBES: FOLEY 6/15 >> R DL PICC 1/61 >>  Subjective: Lethargic and sleepy.  Objective: Filed Vitals:   05/30/16 0616 05/30/16 0757 05/30/16 1000 05/30/16 1345  BP:  112/79 107/93 114/79  Pulse:  51 66 71  Temp:  97.7 F (36.5 C)  97.8 F (36.6 C)  TempSrc:  Axillary  Oral  Resp:  8 11 14   Height:      Weight: 55.4 kg (122 lb 2.2 oz)     SpO2:  100% 100% 100%    Intake/Output Summary (Last 24 hours) at 05/30/16 1635 Last data filed at 05/30/16 1300  Gross per 24 hour  Intake    510 ml  Output   2050 ml  Net  -1540 ml   Filed Weights   05/28/16 0417 05/29/16 0400 05/30/16 0616  Weight: 53 kg (116 lb 13.5 oz) 55.8 kg (123 lb 0.3 oz) 55.4 kg (122 lb 2.2 oz)     Examination:  General exam: Appears alert and following simple commands.  Respiratory system: Clear to auscultation. Respiratory effort normal. Cardiovascular system: S1 & S2 heard, RRR. No JVD, murmurs, rubs, gallops or clicks. No pedal edema. Gastrointestinal system: Abdomen is nondistended, soft and nontender. No organomegaly or masses felt. Normal bowel sounds heard. Central nervous system:  following commands today. . Extremities: no pedal edema.        Data Reviewed: I have personally reviewed following labs and imaging studies  CBC:  Recent Labs Lab 05/24/16 0525 05/25/16 0319 05/27/16 0430 05/29/16 1121  WBC 12.6* 10.2 8.0 8.2  HGB 8.5* 8.5* 9.0* 9.3*  HCT 26.0* 25.9* 28.1* 29.3*  MCV 87.2 86.0 88.1 88.0  PLT 88* 94* 214 439*   Basic Metabolic Panel:  Recent Labs Lab 05/24/16 0525 05/25/16 0319 05/27/16 0430 05/29/16 1121  NA 140 140 140 142  K 4.6 5.0 4.6 4.2  CL 105 103 102  103  CO2 26 29 32 30  GLUCOSE 117* 87 99 76  BUN 19 20 30* 27*  CREATININE 0.94 0.91 1.03 0.72  CALCIUM 9.0 9.0 9.4 9.5  MG 2.2 2.2  --   --   PHOS 5.6* 6.5*  --   --    GFR: Estimated Creatinine Clearance: 85.6 mL/min (by C-G formula based on Cr of 0.72). Liver Function Tests:  Recent Labs Lab 05/29/16 1121  AST 26  ALT 61  ALKPHOS 72  BILITOT 0.1*  PROT 6.0*  ALBUMIN 2.5*   No results for input(s): LIPASE, AMYLASE in the last 168 hours. No results for input(s): AMMONIA in the last 168 hours. Coagulation Profile: No results for input(s): INR, PROTIME in the last 168 hours. Cardiac Enzymes: No results for input(s): CKTOTAL, CKMB, CKMBINDEX, TROPONINI in the last 168 hours. BNP (last 3 results) No results for input(s): PROBNP in the last 8760 hours. HbA1C: No results for input(s): HGBA1C in the last 72 hours. CBG:  Recent Labs Lab 05/27/16 1246 05/27/16 1637 05/27/16 1957 05/28/16 0030 05/28/16 0526  GLUCAP 109* 119* 84 101* 101*   Lipid Profile: No  results for input(s): CHOL, HDL, LDLCALC, TRIG, CHOLHDL, LDLDIRECT in the last 72 hours. Thyroid Function Tests: No results for input(s): TSH, T4TOTAL, FREET4, T3FREE, THYROIDAB in the last 72 hours. Anemia Panel: No results for input(s): VITAMINB12, FOLATE, FERRITIN, TIBC, IRON, RETICCTPCT in the last 72 hours. Sepsis Labs:  Recent Labs Lab 05/29/16 1121 05/29/16 1231  PROCALCITON 0.12  --   LATICACIDVEN  --  1.0    Recent Results (from the past 240 hour(s))  C difficile quick screen w PCR reflex     Status: Abnormal   Collection Time: 05/21/16  5:37 AM  Result Value Ref Range Status   C Diff antigen POSITIVE (A) NEGATIVE Final   C Diff toxin NEGATIVE NEGATIVE Final   C Diff interpretation   Final    C. difficile present, but toxin not detected. This indicates colonization. In most cases, this does not require treatment. If patient has signs and symptoms consistent with colitis, consider treatment. Requires ENTERIC precautions.  Culture, blood (Routine X 2) w Reflex to ID Panel     Status: None   Collection Time: 05/23/16  2:29 PM  Result Value Ref Range Status   Specimen Description BLOOD LEFT HAND  Final   Special Requests IN PEDIATRIC BOTTLE 3CC  Final   Culture NO GROWTH 5 DAYS  Final   Report Status 05/28/2016 FINAL  Final  Culture, blood (Routine X 2) w Reflex to ID Panel     Status: None   Collection Time: 05/23/16  2:38 PM  Result Value Ref Range Status   Specimen Description BLOOD LEFT HAND  Final   Special Requests IN PEDIATRIC BOTTLE  1CC  Final   Culture NO GROWTH 5 DAYS  Final   Report Status 05/28/2016 FINAL  Final  Urine culture     Status: Abnormal   Collection Time: 05/25/16  2:09 AM  Result Value Ref Range Status   Specimen Description URINE, CATHETERIZED  Final   Special Requests NONE  Final   Culture <10,000 COLONIES/mL INSIGNIFICANT GROWTH (A)  Final   Report Status 05/26/2016 FINAL  Final  Culture, blood (Routine X 2) w Reflex to ID Panel     Status:  None (Preliminary result)   Collection Time: 05/29/16 11:55 AM  Result Value Ref Range Status   Specimen Description BLOOD LEFT ARM  Final  Special Requests IN PEDIATRIC BOTTLE 3CC  Final   Culture NO GROWTH 1 DAY  Final   Report Status PENDING  Incomplete  Culture, blood (Routine X 2) w Reflex to ID Panel     Status: None (Preliminary result)   Collection Time: 05/29/16 12:03 PM  Result Value Ref Range Status   Specimen Description BLOOD LEFT HAND  Final   Special Requests IN PEDIATRIC BOTTLE 3CC  Final   Culture NO GROWTH 1 DAY  Final   Report Status PENDING  Incomplete         Radiology Studies: Dg Chest Port 1 View  05/29/2016  CLINICAL DATA:  Sepsis EXAM: PORTABLE CHEST 1 VIEW COMPARISON:  05/27/2016 chest radiograph. FINDINGS: Right PICC terminates in the middle third of the superior vena cava. Stable cardiomediastinal silhouette with normal heart size. No pneumothorax. No pleural effusion. Patchy left lower lobe consolidation is slightly increased. No pulmonary edema. IMPRESSION: Slightly increased patchy left lower lobe consolidation, suggesting pneumonia and/ or aspiration. Recommend follow-up chest imaging to resolution. Electronically Signed   By: Delbert Phenix M.D.   On: 05/29/2016 12:30   Dg Swallowing Func-speech Pathology  05/30/2016  Objective Swallowing Evaluation: Type of Study: MBS-Modified Barium Swallow Study Patient Details Name: Dale [name] MRN: [id number] Date of Birth: 02/25/1966 Today's Date: 05/30/2016 Time: SLP Start Time (ACUTE ONLY): 1225-SLP Stop Time (ACUTE ONLY): 1242 SLP Time Calculation (min) (ACUTE ONLY): 17 min Past Medical History: Past Medical History Diagnosis Date . Depression  . Cardiac arrest (HCC)  . Cardiogenic shock (HCC)  . Acute respiratory failure with hypoxia (HCC)  . Meningitis, streptococcal 02/16/2016 . Trichomonal urethritis in male 02/16/2016 . Tobacco abuse 02/16/2016 . Substance abuse  . ETOH abuse  . Septic shock (HCC)  . Urinary retention  05/04/2016   Urinary retention - Continue Flomax    . Systolic and diastolic CHF, chronic (HCC)  . 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 Severe sepsis (HCC)  . Respiratory failure (HCC) 03/03/2016 . Protein-calorie malnutrition, severe (HCC)  . NSTEMI (non-ST elevated myocardial infarction) (HCC) 03/14/2016 . Meningoencephalitis  . Meningitis, pneumococcal, recurrent  . Low serum cortisol level (HCC) 05/04/2016   Low a.m. cortisol  - Was 1.2 checked on 04/03/2016  - This likely is reflective of cortisol suppression from steroid use - Tapered steroids off  . Hypothermia 03/20/2016 . Hypernatremia  . Hyperlipidemia 05/04/2016   Dyslipidemia - Continue statin.    Marland Kitchen Hydrocephalus  . Dysphagia 05/04/2016   Dysphagia - Dysphagia 1 diet per SLP    . Dementia 04/27/2016 . Convulsions/seizures (HCC) 05/04/2016   Seizure disorder - Continue Keppra, Depakote.    . CHF (congestive heart failure) (HCC)  . Cerebral thrombosis with cerebral infarction 03/25/2016 . Cerebral septic emboli (HCC)  . Altered mental status  . AKI (acute kidney injury) (HCC) 02/16/2016 . Acute renal failure (HCC)  . Acute ischemic stroke (HCC)  . Acute encephalopathy  Past Surgical History: Past Surgical History Procedure Laterality Date . Ankle surgery   . Cardiac catheterization N/A 02/17/2016   Procedure: IABP Insertion;  Surgeon: Iran Ouch, MD;  Location: [facility] INVASIVE CV LAB;  Service: Cardiovascular;  Laterality: N/A; . Tee without cardioversion N/A 03/29/2016   Procedure: TRANSESOPHAGEAL ECHOCARDIOGRAM (TEE);  Surgeon: Laurey Morale, MD;  Location: [facility]COPY;  Service: Cardiovascular;  Laterality: N/A; HPI: 50 year old male with h/o strep pneumonia meningitis, dysphagia, dementia and hospital course complicated by cardiac arrest and cardiogenic shock in March 2016, ARF, shock liver, adrenal insufficiency presents  with acute encephalopathy. SLP evaluations during previous admissions have recommended Dys 1 diet and nectar thick liquids with  advancement to Dys 2 textures prior to discharge. Pt is pending possible PEG placement. Subjective: pt alert, speaks when prompted to do so Assessment / Plan / Recommendation CHL IP CLINICAL IMPRESSIONS 05/30/2016 Therapy Diagnosis Mild pharyngeal phase dysphagia Clinical Impression Patient presents with a mild pharyngeal phase dysphagia, exacerbated by AMS with poor sustained attention/easily distracted. Patient with a delay in swallow initiation resulting in intermittent silent aspiration of thin liquids, prevented with nectar thick during today's study although unable to r/o episodic aspiration of thickened liquids as well. Oral phase functional with prolonged mastication of soft solids due to missing dentition. Pharyngeal strength WFL. Will continue nectar thick liquids and monitor at bedside for tolerance. Ability to advance liquid textures relies primarily on mentation.  Impact on safety and function Mild aspiration risk   CHL IP TREATMENT RECOMMENDATION 05/30/2016 Treatment Recommendations Therapy as outlined in treatment plan below   Prognosis 05/30/2016 Prognosis for Safe Diet Advancement Fair Barriers to Reach Goals Cognitive deficits Barriers/Prognosis Comment -- CHL IP DIET RECOMMENDATION 05/30/2016 SLP Diet Recommendations Dysphagia 2 (Fine chop) solids;Nectar thick liquid Liquid Administration via Cup;No straw Medication Administration Whole meds with puree Compensations Slow rate;Small sips/bites;Minimize environmental distractions Postural Changes Seated upright at 90 degrees   CHL IP OTHER RECOMMENDATIONS 05/30/2016 Recommended Consults -- Oral Care Recommendations Oral care BID Other Recommendations Order thickener from pharmacy;Prohibited food (jello, ice cream, thin soups);Remove water pitcher   CHL IP FOLLOW UP RECOMMENDATIONS 05/30/2016 Follow up Recommendations [facility] FREQUENCY AND DURATION 05/30/2016 Speech Therapy Frequency (ACUTE ONLY) min 2x/week Treatment Duration 2  weeks      CHL IP ORAL PHASE 05/30/2016 Oral Phase WFL Oral - Pudding Teaspoon -- Oral - Pudding Cup -- Oral - Honey Teaspoon -- Oral - Honey Cup -- Oral - Nectar Teaspoon -- Oral - Nectar Cup -- Oral - Nectar Straw -- Oral - Thin Teaspoon -- Oral - Thin Cup -- Oral - Thin Straw -- Oral - Puree -- Oral - Mech Soft -- Oral - Regular -- Oral - Multi-Consistency -- Oral - Pill -- Oral Phase - Comment --  CHL IP PHARYNGEAL PHASE 05/30/2016 Pharyngeal Phase Impaired Pharyngeal- Pudding Teaspoon -- Pharyngeal -- Pharyngeal- Pudding Cup -- Pharyngeal -- Pharyngeal- Honey Teaspoon -- Pharyngeal -- Pharyngeal- Honey Cup -- Pharyngeal -- Pharyngeal- Nectar Teaspoon -- Pharyngeal -- Pharyngeal- Nectar Cup Delayed swallow initiation-pyriform sinuses;Delayed swallow initiation-vallecula Pharyngeal -- Pharyngeal- Nectar Straw -- Pharyngeal -- Pharyngeal- Thin Teaspoon Delayed swallow initiation-pyriform sinuses Pharyngeal -- Pharyngeal- Thin Cup Delayed swallow initiation-pyriform sinuses;Penetration/Aspiration during swallow;Moderate aspiration Pharyngeal Material enters airway, passes BELOW cords without attempt by patient to eject out (silent aspiration) Pharyngeal- Thin Straw Delayed swallow initiation-pyriform sinuses;Penetration/Aspiration during swallow;Moderate aspiration Pharyngeal Material enters airway, passes BELOW cords without attempt by patient to eject out (silent aspiration) Pharyngeal- Puree Delayed swallow initiation-vallecula Pharyngeal -- Pharyngeal- Mechanical Soft Delayed swallow initiation-vallecula Pharyngeal -- Pharyngeal- Regular -- Pharyngeal -- Pharyngeal- Multi-consistency -- Pharyngeal -- Pharyngeal- Pill -- Pharyngeal -- Pharyngeal Comment --  CHL IP CERVICAL ESOPHAGEAL PHASE 05/30/2016 Cervical Esophageal Phase WFL Pudding Teaspoon -- Pudding Cup -- Honey Teaspoon -- Honey Cup -- Nectar Teaspoon -- Nectar Cup -- Nectar Straw -- Thin Teaspoon -- Thin Cup -- Thin Straw -- Puree -- Mechanical Soft --  Regular -- Multi-consistency -- Pill -- Cervical Esophageal Comment -- CHL IP GO 03/09/2016 Functional Assessment Tool Used skilled clinical judgement Functional  Limitations Spoken language expressive Swallow Current Status [phone](G8996) CJ Swallow Goal Status [phone](G8997) CI Swallow Discharge Status [phone](G8998) (None) Motor Speech Current Status [phone](G8999) (None) Motor Speech Goal Status [phone](G9186) (None) Motor Speech Goal Status [phone](G9158) (None) Spoken Language Comprehension Current Status [phone](G9159) (None) Spoken Language Comprehension Goal Status (L2440(G9160) (None) Spoken Language Comprehension Discharge Status [phone](G9161) (None) Spoken Language Expression Current Status [phone](G9162) (None) Spoken Language Expression Goal Status [phone](G9163) (None) Spoken Language Expression Discharge Status [phone](G9164) (None) Attention Current Status (G3875(G9165) (None) Attention Goal Status (I4332(G9166) (None) Attention Discharge Status [phone](G9167) (None) Memory Current Status (C1660(G9168) (None) Memory Goal Status (Y3016(G9169) (None) Memory Discharge Status (W1093(G9170) (None) Voice Current Status (A3557(G9171) (None) Voice Goal Status (D2202(G9172) (None) Voice Discharge Status [phone](G9173) (None) Other Speech-Language Pathology Functional Limitation [phone](G9174) (None) Other Speech-Language Pathology Functional Limitation Goal Status (E8315(G9175) (None) Other Speech-Language Pathology Functional Limitation Discharge Status [phone](G9176) (None) Leah McCoy MA, CCC-SLP [phone] McCoy Leah Meryl 05/30/2016, 1:07 PM                   Scheduled Meds: . antiseptic oral rinse  7 mL Mouth Rinse q12n4p  . atorvastatin  20 mg Per Tube q1800  . chlorhexidine  15 mL Mouth Rinse BID  . folic acid  1 mg Per Tube Daily  . levETIRAcetam  1,000 mg Per Tube BID  . piperacillin-tazobactam (ZOSYN)  IV  3.375 g Intravenous Q8H  . thiamine  100 mg Per Tube Daily  . Valproate Sodium  250 mg Per Tube Q8H   Continuous Infusions:     LOS: 11 days    Time spent: 30 minutes    Shellyann Wandrey, MD [facility] Pager  [phone]   If 7PM-7AM, please contact night-coverage [email or web address] Password [name] 05/30/2016, 4:35 PM

## 2016-05-30 NOTE — Progress Notes (Signed)
Speech Language Pathology Treatment: Dysphagia  Patient Details Name: Dale [name] MRN: [id number] DOB: 01/14/1966 Today's Date: 05/30/2016 Time: [id number] SLP Time Calculation (min) (ACUTE ONLY): 15 min  Assessment / Plan / Recommendation Clinical Impression  Treatment focused on diet tolerance, potential to advance, and use of compensatory strategies. Patient alert however required min-mod cues to sustain attention to task. Po trials provided. Patient able to self feed with moderate verbal and tactile cueing for small single sips to decrease risk of aspiration. No overt indication of aspiration observe with pureed solids and nectar thick liquid, strong cough response post swallow with thin liquid, likely delayed swallow initiation based on presentation and MBS results. Noted temperature spike and CXR 6/25 (24 hours after initiating diet) which indicated slightly increased patchy left lower lobe consolidation, suggesting pneumonia and/ or aspiration. Suggest repeat MBS given above to ensure most appropriate diet recommendations.    HPI HPI: 50 year old male with h/o strep pneumonia meningitis, dysphagia, dementia and hospital course complicated by cardiac arrest and cardiogenic shock in March 2016, ARF, shock liver, adrenal insufficiency presents with acute encephalopathy. SLP evaluations during previous admissions have recommended Dys 1 diet and nectar thick liquids with advancement to Dys 2 textures prior to discharge. Pt is pending possible PEG placement.      SLP Plan  Continue with current plan of care     Recommendations  Diet recommendations: Dysphagia 1 (puree);Nectar-thick liquid Liquids provided via: Cup;No straw Medication Administration: Whole meds with puree Supervision: Patient able to self feed;Full supervision/cueing for compensatory strategies Compensations: Slow rate;Small sips/bites;Minimize environmental distractions Postural Changes and/or Swallow Maneuvers: Seated  upright 90 degrees             Oral Care Recommendations: Oral care BID Follow up Recommendations: Skilled Nursing facility Plan: Continue with current plan of care     GO             [facility] Amarya Kuehl MA, CCC-SLP [phone]    Dale [name] 05/30/2016, 11:17 AM

## 2016-05-30 NOTE — Progress Notes (Signed)
Physical Therapy Treatment Patient Details Name: Dale [name] MRN: [id number] DOB: 09-17-1966 Today's Date: 05/30/2016    History of Present Illness Pt adm with acute respiratory failure and seizure. PMH - pneumonia meningitis and hospital course complicated by cardiac arrest and cardiogenic shock in March 2016, multiple readmissions with discharge to SNF; dementia; cerebral septic emboli, CHF    PT Comments    MOre alert and following commands more quickly (although with delay). Met all goals today with goals updated.  Follow Up Recommendations  SNF     Equipment Recommendations  Other (comment) (to be determined in next venue)    Recommendations for Other Services       Precautions / Restrictions Precautions Precautions: Fall    Mobility  Bed Mobility Overal bed mobility: Needs Assistance;+2 for physical assistance;+ 2 for safety/equipment Bed Mobility: Supine to Sit     Supine to sit: Min guard        Transfers Overall transfer level: Needs assistance Equipment used: None Transfers: Sit to/from [facility] Sit to Stand: Mod assist Stand pivot transfers: Mod assist       General transfer comment: once standing, has mild tendency to lean posteriorly  Ambulation/Gait Ambulation/Gait assistance: Mod assist Ambulation Distance (Feet): 2 Feet Assistive device: None Gait Pattern/deviations: Step-to pattern     General Gait Details: side step to his Rt; assist to weight shift and advance lead foot, then pt would shift and move 2nd foot   Stairs            Wheelchair Mobility    Modified Rankin (Stroke Patients Only)       Balance   Sitting-balance support: No upper extremity supported;Feet supported Sitting balance-Leahy Scale: Fair     Standing balance support: No upper extremity supported Standing balance-Leahy Scale: Poor Standing balance comment: stood for up to 3 minutes for pericare (twice) due to incontinent of bowels                     Cognition Arousal/Alertness: Awake/alert Behavior During Therapy: Flat affect Overall Cognitive Status: No family/caregiver present to determine baseline cognitive functioning Area of Impairment: Orientation;Problem solving;Awareness;Following commands;Safety/judgement;Attention;Memory Orientation Level: Place;Time;Situation (named place with max cues) Current Attention Level: Sustained Memory: Decreased short-term memory Following Commands: Follows one step commands inconsistently;Follows one step commands with increased time Safety/Judgement: Decreased awareness of deficits;Decreased awareness of safety Awareness:  (pre-intellectual) Problem Solving: Slow processing;Decreased initiation;Requires verbal cues;Requires tactile cues;Difficulty sequencing General Comments: incr alertness and participation    Exercises      General Comments        Pertinent Vitals/Pain Pain Assessment: Faces Faces Pain Scale: No hurt    Home Living                      Prior Function            PT Goals (current goals can now be found in the care plan section) Acute Rehab PT Goals Patient Stated Goal: Pt unable to state due to confusion PT Goal Formulation: Patient unable to participate in goal setting Time For Goal Achievement: 06/13/16 Potential to Achieve Goals: Good Progress towards PT goals: Goals met and updated - see care plan    Frequency  Min 2X/week    PT Plan Current plan remains appropriate    Co-evaluation             End of Session Equipment Utilized During Treatment: Gait belt Activity Tolerance: Patient tolerated treatment  well Patient left: with call bell/phone within reach;in chair;with chair alarm set;with restraints reapplied (Lt hand mitt)     Time: 1355-1430 PT Time Calculation (min) (ACUTE ONLY): 35 min  Charges:  $Therapeutic Activity: 23-37 mins                    G Codes:      Kaid Seeberger 06-28-2016, 2:55  PM Pager [phone]

## 2016-05-31 LAB — PROCALCITONIN

## 2016-05-31 MED ORDER — RESOURCE THICKENUP CLEAR PO POWD: ORAL | Status: DC

## 2016-05-31 MED ORDER — VALPROATE SODIUM 250 MG/5ML PO SYRP: 250.0000 mg | ORAL_SOLUTION | Freq: Three times a day (TID) | ORAL | Status: DC

## 2016-05-31 MED ORDER — ENSURE ENLIVE PO LIQD: 237.0000 mL | Freq: Four times a day (QID) | ORAL | Status: DC

## 2016-05-31 MED ORDER — LEVETIRACETAM 100 MG/ML PO SOLN: 1000.0000 mg | Freq: Two times a day (BID) | ORAL | Status: DC

## 2016-05-31 MED ORDER — AMOXICILLIN-POT CLAVULANATE 875-125 MG PO TABS: 1.0000 | ORAL_TABLET | Freq: Two times a day (BID) | ORAL | Status: DC

## 2016-05-31 NOTE — Progress Notes (Signed)
Speech Language Pathology Treatment: Dysphagia  Patient Details Name: Dale [name] MRN: [id number] DOB: 10/24/1966 Today's Date: 05/31/2016 Time: [id number] SLP Time Calculation (min) (ACUTE ONLY): 14 min  Assessment / Plan / Recommendation Clinical Impression  Pt with good toleration of breakfast per RN, although he would not feed himself.  With improved initiation during our session and able to feed himself with min initial cues and tactile assist to initiate the motor movement; min cues to slow rate, however with excellent toleration of nectars and dysphagia 1 with no overt s/s of aspiration.  Pt communicating verbally with short phrases in response to questions; affect flat and limited spontaneity overall.  SLP will continue to follow.   HPI HPI: 50 year old male with h/o strep pneumonia meningitis, dysphagia, dementia and hospital course complicated by cardiac arrest and cardiogenic shock in March 2016, ARF, shock liver, adrenal insufficiency presents with acute encephalopathy. SLP evaluations during previous admissions have recommended Dys 1 diet and nectar thick liquids with advancement to Dys 2 textures prior to discharge. Pt is pending possible PEG placement.      SLP Plan  Continue with current plan of care     Recommendations  Diet recommendations: Dysphagia 1 (puree);Nectar-thick liquid Liquids provided via: Cup;No straw Medication Administration: Whole meds with puree Supervision: Patient able to self feed;Full supervision/cueing for compensatory strategies Compensations: Slow rate;Small sips/bites;Minimize environmental distractions Postural Changes and/or Swallow Maneuvers: Seated upright 90 degrees             Oral Care Recommendations: Oral care BID Follow up Recommendations: Skilled Nursing facility Plan: Continue with current plan of care     GO              Muath Hallam L. Samson Fredericouture, [place] CCC/SLP Pager [phone]   Blenda MountsCouture, Dannette Kinkaid [name] 05/31/2016, 10:31 AM

## 2016-05-31 NOTE — Discharge Summary (Signed)
Physician Discharge Summary  Dale [name] AVW:[id number] DOB: 1966-08-17 DOA: 05/19/2016  PCP: No primary care provider on file.  Admit date: 05/19/2016 Discharge date: 05/31/2016  Admitted From: SNF Disposition:  SNF  Recommendations for Outpatient Follow-up:  1. Follow up with PCP in 1-2 weeks 2. Please obtain BMP/CBC in one week 3. Please follow up with SLP to see if  Your diet can be advanced.  4. Please follow up with CXR in 2 to 4 weeks to evaluate for resolution of the aspiration pneumonia.     Discharge Condition:guarded CODE STATUS:full code.  Diet recommendation: Dysphagia 1 with nectar thick liquids.   Brief/Interim Summary: 50 year old male with h/o strep pneumonia meningitis, dysphagia, dementia and hospital course complicated by cardiac arrest and cardiogenic shock in March 2016, ARF, shock liver, adrenal insufficiency presents with acute encephalopathy.   Recent Events in the last 3 months;  3/14-4/6: Admitted with strep pneumo meningitis, c/b cardiac arrest, and cardiogenic shock (EF 20%, recovered to 50%) briefly on amiodarone, ARDS requiring prolonged intubation and trach (now removed), ARF, shock liver, and adrenal insufficiency. He was discharged on 4/6 with weekly Pen G IM until 4/10.  4/16-5/1: Readmitted with fall, altered mental status. Repeat LP had no growth, TEE without vegetation, ultimately transitioned to ceftriaxone 2g BID with plans for >4 weeks IV antibiotics. During this hospitalization, he was planning for SNF placement until the day of discharge when the patient's brother asked to take him home (it appears that this wasn't verified with the patient's estranged but legally married wife).  Since discharge, patient has returned to the ED 4 times because of loss of PICC line, PICC requiring to be flushed, PICC coming out, or patient wandering unattended in the street (he has dementia since his meningitis).  5/24-5/26: Readmitted for placement to SNF,  given additional week of rocephin and was supposed to follow up with ID before stopping rocephin.  On 6/15 patient came from NH, due to acute encephalopathy, seizure, . He was restarted on vancomycin and rocephin for encephalopathy and sepsis of unclear etiology .   6/21 patient transferred to [facility] service frm PCCM service.  6/26 suspect pt had aspiration pneumonia and started him on IV zosyn and SLP eval with MBS, Started him on dysphagia 1 diet with nectar thick liquids.   Discharge Diagnoses:  Principal Problem:   Septic shock (HCC) Active Problems:   Acute encephalopathy   Meningitis, pneumococcal, recurrent   Cerebral septic emboli (HCC)   Dementia   Convulsions/seizures (HCC)   Dysphagia   Hypothermia   Acute respiratory failure with hypoxemia (HCC)  Acute Respiratory Failure on admission ; unclear etiology.  Resolved not requiring oxygen.  Duuonebs, NT suctioning as needed.   Dysphagia on NG tube :  NG tube discontinued on 6/24 after SLP evaluation done and recommended to start dysphagia 1 diet with nectar thick liquids   He does not need any G tube anymore.  MBS done and SLP recommended dysphagia 1 diet with nectar thick.  Recommend SLP eval on discharge.      Recurrent Pneumococcal meningitis Plan was to discontinue rocephin by 5/26 and to be seen by ID prior to discontinuing the antibiotics.  But patient never followed up with ID. His rocephin was restarted on 6/15 Along with IV vancomycin on admission, for possible sepsis,. His blood cultures from 6/19 have been negative.  Discussed with Dr Drue Second over the phone, recommended stopping the antibiotic as cultures have been negative.  Stopped IV rocephin  on 6/24.       Septic shock on admission: unclear etiology.  He was restarted on IV vancomycin and IV Rocephin on 6/15 and vancomycin discontinued on 6/19.  Rocephin was discontinued on 6/24 after discussing with ID.  Blood cultures drawn on 6/19  have been negative so far. Repeat blood cultures drawn on 6/24 and have been negative till date.  Repeat CXR shows resolving infection in the left mid and lower lung opacity.    Acute encephalopathy from ? sepsis and recurrent meningitis.  He at baseline is non verbal, can only give one word answers and follow simple commands.  He is very unsteady on his feet at baseline.  Neurology consulted for the episode of seizures he had at the facility prior to admission.  EEG done showed moderate generalized slowing of brain activity consistent with a global brain injury such as seen in hypoxia, metabolic, toxic, or infectious etiologies.  Neurology recommended restarting his dose of depakote and keppra. increased the dose of keppra from 500 bid to 1000 mg bID.  No seizures since restarting his meds.     Hypothermia, more lethargic on 6/25; bp borderline. Ordered fluid bolus NS 1 LIT today. Blood cultures ordered.  Lactic acid, pro calcitonin,labs, portable CXR, and UA ordered. lactic acid is normal. procalcitonin is negative.  CXR shows worsening opacification, suspect aspiration , started the patient on IV zosyn changed to augmentin on discharge.     Hyperkalemia: resolved.   Acute Kidney Injury: - probably from sepsis and dehydration.  - improved.    Protein calorie malnutrition: Dietary recommendations.  RESTARTED DIET.  Bradycardia: Did not restart his coreg on discharge.   Discharge Instructions  Discharge Instructions    Discharge instructions    Complete by:  As directed   Please follow up with PCP in one week and get a repeat CXR in 2 weeks for resolution of the pneumonia.  Diet is dysphagia 1 diet with nectar thick liquids.            Medication List    STOP taking these medications        carvedilol 3.125 MG tablet  Commonly known as:  COREG     HEPARIN LOCK FLUSH IV     TUBERCULIN PPD ID      TAKE these medications        albuterol (2.5  MG/3ML) 0.083% nebulizer solution  Commonly known as:  PROVENTIL  Take 3 mLs (2.5 mg total) by nebulization every 3 (three) hours as needed for wheezing.     amoxicillin-clavulanate 875-125 MG tablet  Commonly known as:  AUGMENTIN  Take 1 tablet by mouth 2 (two) times daily.     aspirin 325 MG tablet  Place 1 tablet (325 mg total) into feeding tube daily.     atorvastatin 20 MG tablet  Commonly known as:  LIPITOR  Take 1 tablet (20 mg total) by mouth daily at 6 PM.     famotidine 40 MG/5ML suspension  Commonly known as:  PEPCID  Place 2.5 mLs (20 mg total) into feeding tube daily.     feeding supplement (ENSURE ENLIVE) Liqd  Take 237 mLs by mouth every 6 (six) hours.     folic acid 1 MG tablet  Commonly known as:  FOLVITE  Place 1 tablet (1 mg total) into feeding tube daily.     levETIRAcetam 100 MG/ML solution  Commonly known as:  KEPPRA  Take 10 mLs (1,000 mg total) by mouth 2 (two)  times daily.     LORazepam 0.5 MG tablet  Commonly known as:  ATIVAN  Take 0.5 mg by mouth every 8 (eight) hours as needed for anxiety or sedation.     RESOURCE THICKENUP CLEAR Powd  As needed.     tamsulosin 0.4 MG Caps capsule  Commonly known as:  FLOMAX  Take 1 capsule (0.4 mg total) by mouth daily.     thiamine 100 MG tablet  Place 1 tablet (100 mg total) into feeding tube daily.     valproic acid 250 MG/5ML syrup  Commonly known as:  DEPAKENE  Take 5 mLs (250 mg total) by mouth every 8 (eight) hours.        No Known Allergies  Consultations:  PCCM.   Procedures/Studies: Ct Abdomen Wo Contrast  Jun 26, 2016  CLINICAL DATA:  Septic shock and anatomic assessment prior to possible percutaneous gastrostomy tube placement. EXAM: CT ABDOMEN WITHOUT CONTRAST TECHNIQUE: Multidetector CT imaging of the abdomen was performed following the standard protocol without IV contrast. COMPARISON:  None. FINDINGS: Lower chest:  Left basilar atelectasis present. Hepatobiliary: Unremarkable  unenhanced appearance of the liver and gallbladder. Pancreas: Unremarkable unenhanced appearance of the pancreas. Spleen: Unremarkable spleen. Adrenals/Urinary Tract: No evidence of hydronephrosis or urinary tract calculi. Stomach/Bowel: A feeding tube is present which extends through the stomach and duodenum and into the proximal jejunum. The stomach is very high in location and transversely oriented with much of the stomach posterior to the left lobe of the liver. No evidence of bowel obstruction, free air or abscess. Vascular/Lymphatic: No enlarged lymph nodes are identified. Sparse calcified plaque of the abdominal aorta present consistent with atherosclerosis. Other: No hernias. Musculoskeletal: Unremarkable visualized bony structures. IMPRESSION: 1. Transverse stomach with much of the stomach posterior to the left lobe of the liver. Although this may make percutaneous gastrostomy tube placement more difficult, with insufflation of the stomach, there may be a safe window present to perform gastrostomy tube placement. 2. Aortic atherosclerosis. 3. Current feeding tube extends into the proximal jejunum. Electronically Signed   By: Irish Lack M.D.   On: 06-26-16 16:41   Ct Head Wo Contrast  05/19/2016  CLINICAL DATA:  Seizure.  Recent diagnosis of meningitis.  Dementia. EXAM: CT HEAD WITHOUT CONTRAST TECHNIQUE: Contiguous axial images were obtained from the base of the skull through the vertex without intravenous contrast. COMPARISON:  03/22/2016 FINDINGS: Brain: Mild diffuse atrophy as before. No evidence of acute infarction, hemorrhage, extra-axial collection, ventriculomegaly, or mass effect. Vascular: No hyperdense vessel or unexpected calcification. Skull: Negative for fracture or focal lesion. Sinuses/Orbits: No acute findings. Other: None. IMPRESSION: 1. Mild atrophy. Negative for bleed or other acute intracranial process. Electronically Signed   By: Corlis Leak M.D.   On: 05/19/2016 15:56   US  Abdomen Limited  05/19/2016  CLINICAL DATA:  Abnormal liver function tests. EXAM: US ABDOMEN LIMITED - RIGHT UPPER QUADRANT COMPARISON:  11/13/2011 CT scan FINDINGS: Gallbladder: Multiple tiny gallstones are present measuring up to 3 mm diameter. These cause mild shadowing. There also appears to be tumefactive sludge in the gallbladder especially along the fundus, surrounding a small gallstone. This is mobile and does not demonstrate internal blood flow to suggest a polyp. There could be smaller polyps along the gallbladder wall given the specular tiny echogenicities along the gallbladder wall. Common bile duct: Diameter: 3 mm Liver: No focal lesion identified. Within normal limits in parenchymal echogenicity. IMPRESSION: 1. Multiple small gallstones in the gallbladder. There is some tumefactive sludge surrounding  one of the gallstones, mobile within the gallbladder. The other gallstones are in the 3 mm size range. There may be some tiny polyps along the gallbladder wall but none measuring more than 3 mm in diameter. Hyperplastic cholecystosis with superimposed sludge and tiny gallstones not excluded. No overt gallbladder wall thickening. Sonographic Murphy's sign absent. Electronically Signed   By: Gaylyn Rong M.D.   On: 05/19/2016 18:57   Dg Chest Port 1 View  05/29/2016  CLINICAL DATA:  Sepsis EXAM: PORTABLE CHEST 1 VIEW COMPARISON:  05/27/2016 chest radiograph. FINDINGS: Right PICC terminates in the middle third of the superior vena cava. Stable cardiomediastinal silhouette with normal heart size. No pneumothorax. No pleural effusion. Patchy left lower lobe consolidation is slightly increased. No pulmonary edema. IMPRESSION: Slightly increased patchy left lower lobe consolidation, suggesting pneumonia and/ or aspiration. Recommend follow-up chest imaging to resolution. Electronically Signed   By: Delbert Phenix M.D.   On: 05/29/2016 12:30   Dg Chest Port 1 View  05/27/2016  CLINICAL DATA:   50 year old male with fever, acute encephalopathy, seizure. Streptococcus meningitis in March, with hospital course at that time complicated by cardiac arrest and cardiogenic shock. Initial encounter. EXAM: PORTABLE CHEST 1 VIEW COMPARISON:  05/24/2016 and earlier. FINDINGS: Portable AP semi upright view at 1117 hours. Feeding tube in place, appears to loop in the stomach with tip pointed slightly retrograde at the proximal gastric body (arrow). Right PICC line remains in place. Asymmetric reticulonodular opacity in the left mid and lower lung has mildly regressed. Upper lobes remain clear. Right lung remains clear. No pneumothorax, pulmonary edema, pleural effusion or areas of worsening ventilation. Mediastinal contours remain normal. IMPRESSION: 1. Regressed but not resolved left mid and lower lung reticulonodular opacity compatible with improving left lung infection. 2. No pleural effusion or new cardiopulmonary abnormality. 3. Feeding tube looped in the stomach. Electronically Signed   By: Odessa Fleming M.D.   On: 05/27/2016 11:33   Dg Chest Port 1 View  05/24/2016  CLINICAL DATA:  Respiratory failure. EXAM: PORTABLE CHEST 1 VIEW COMPARISON:  05/21/2016. FINDINGS: Feeding tube and right PICC line stable position. Bibasilar atelectasis. Left lower lobe infiltrate with slight interim improvement. No pleural effusion or pneumothorax. IMPRESSION: 1.  Lines and tubes stable position. 2. Low lung volumes with bibasilar atelectasis. Left lower lobe infiltrate with slight improvement from prior exam . Electronically Signed   By: Maisie Fus  Register   On: 05/24/2016 06:56   Dg Chest Port 1 View  05/21/2016  CLINICAL DATA:  Pneumonia EXAM: PORTABLE CHEST 1 VIEW COMPARISON:  Chest radiograph from one day prior. FINDINGS: Right PICC terminates in the lower third of the superior vena cava. Enteric tube terminates in the proximal stomach. Stable cardiomediastinal silhouette with normal heart size. No pneumothorax. No pleural  effusion. Patchy consolidation in the mid to lower left lung is increased. IMPRESSION: Increased patchy consolidation in the mid to lower left lung, most consistent with pneumonia. Electronically Signed   By: Delbert Phenix M.D.   On: 05/21/2016 08:35   Dg Chest Portable 1 View  05/20/2016  CLINICAL DATA:  Increased shortness of breath tonight. EXAM: PORTABLE CHEST 1 VIEW COMPARISON:  05/09/2016 FINDINGS: PICC line tip in the SVC region. Slightly increased densities at the lung bases, left side greater than right. Upper lungs are clear. Heart size is normal and within normal limits. The trachea is midline. IMPRESSION: Increased densities along the medial lung bases bilaterally. Findings could represent atelectasis. Subtle infectious process is possible. Electronically  Signed   By: Richarda OverlieAdam  Henn M.D.   On: 05/20/2016 01:07   Dg Chest Port 1 View  05/19/2016  CLINICAL DATA:  Hypothermia EXAM: PORTABLE CHEST 1 VIEW COMPARISON:  03/20/2016 FINDINGS: The patient is rotated to the left on today's radiograph, reducing diagnostic sensitivity and specificity. Right PICC line tip: SVC. The lungs appear clear. Cardiac and mediastinal contours normal. No pleural effusion identified. IMPRESSION: 1. No acute findings. 2. Right PICC line tip:  SVC. Electronically Signed   By: Gaylyn RongWalter  Liebkemann M.D.   On: 05/19/2016 15:04   Dg Abd Portable 1v  05/25/2016  CLINICAL DATA:  Nasogastric tube placement.  Initial encounter. EXAM: PORTABLE ABDOMEN - 1 VIEW COMPARISON:  Abdominal radiograph performed 05/20/2016 FINDINGS: The patient's enteric tube is seen coiling within the stomach. This will likely progress further with time. The visualized bowel gas pattern is unremarkable. Scattered air and stool filled loops of colon are seen; no abnormal dilatation of small bowel loops is seen to suggest small bowel obstruction. No free intra-abdominal air is identified, though evaluation for free air is limited on a single supine view. The  visualized osseous structures are within normal limits; the sacroiliac joints are unremarkable in appearance. The visualized portions of the lungs are essentially clear. IMPRESSION: Enteric tube noted coiling within the stomach. This will likely progress further with time. Electronically Signed   By: Roanna RaiderJeffery  Chang M.D.   On: 05/25/2016 02:06   Dg Abd Portable 1v  05/20/2016  CLINICAL DATA:  Feeding tube placement EXAM: PORTABLE ABDOMEN - 1 VIEW COMPARISON:  03/29/2016 FINDINGS: Feeding tube tip at the ligament of Treitz. Normal bowel gas pattern. Increase in left basilar opacity seen on comparison chest x-ray. IMPRESSION: 1. Feeding tube tip at the ligament of Treitz. 2. Increase in left basilar airspace disease compared to chest x-ray earlier today, concerning for aspiration or pneumonia. Electronically Signed   By: Marnee SpringJonathon  Watts M.D.   On: 05/20/2016 13:06   Dg Swallowing Func-speech Pathology  05/30/2016  Objective Swallowing Evaluation: Type of Study: MBS-Modified Barium Swallow Study Patient Details Name: [name] MRN: [id number] Date of Birth: 07/17/1966 Today's Date: 05/30/2016 Time: SLP Start Time (ACUTE ONLY): 1225-SLP Stop Time (ACUTE ONLY): 1242 SLP Time Calculation (min) (ACUTE ONLY): 17 min Past Medical History: Past Medical History Diagnosis Date . Depression  . Cardiac arrest (HCC)  . Cardiogenic shock (HCC)  . Acute respiratory failure with hypoxia (HCC)  . Meningitis, streptococcal 02/16/2016 . Trichomonal urethritis in male 02/16/2016 . Tobacco abuse 02/16/2016 . Substance abuse  . ETOH abuse  . Septic shock (HCC)  . Urinary retention 05/04/2016   Urinary retention - Continue Flomax    . Systolic and diastolic CHF, chronic (HCC)  . 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 Severe sepsis (HCC)  . Respiratory failure (HCC) 03/03/2016 . Protein-calorie malnutrition, severe (HCC)  . NSTEMI (non-ST elevated myocardial infarction) (HCC) 03/14/2016 . Meningoencephalitis  . Meningitis, pneumococcal,  recurrent  . Low serum cortisol level (HCC) 05/04/2016   Low a.m. cortisol  - Was 1.2 checked on 04/03/2016  - This likely is reflective of cortisol suppression from steroid use - Tapered steroids off  . Hypothermia 03/20/2016 . Hypernatremia  . Hyperlipidemia 05/04/2016   Dyslipidemia - Continue statin.    Marland Kitchen. Hydrocephalus  . Dysphagia 05/04/2016   Dysphagia - Dysphagia 1 diet per SLP    . Dementia 04/27/2016 . Convulsions/seizures (HCC) 05/04/2016   Seizure disorder - Continue Keppra, Depakote.    . CHF (congestive heart failure) (HCC)  .  Cerebral thrombosis with cerebral infarction 03/25/2016 . Cerebral septic emboli (HCC)  . Altered mental status  . AKI (acute kidney injury) (HCC) 02/16/2016 . Acute renal failure (HCC)  . Acute ischemic stroke (HCC)  . Acute encephalopathy  Past Surgical History: Past Surgical History Procedure Laterality Date . Ankle surgery   . Cardiac catheterization N/A 02/17/2016   Procedure: IABP Insertion;  Surgeon: Iran Ouch, MD;  Location: [facility] INVASIVE CV LAB;  Service: Cardiovascular;  Laterality: N/A; . Tee without cardioversion N/A 03/29/2016   Procedure: TRANSESOPHAGEAL ECHOCARDIOGRAM (TEE);  Surgeon: Laurey Morale, MD;  Location: [facility] ENDOSCOPY;  Service: Cardiovascular;  Laterality: N/A; HPI: 50 year old male with h/o strep pneumonia meningitis, dysphagia, dementia and hospital course complicated by cardiac arrest and cardiogenic shock in March 2016, ARF, shock liver, adrenal insufficiency presents with acute encephalopathy. SLP evaluations during previous admissions have recommended Dys 1 diet and nectar thick liquids with advancement to Dys 2 textures prior to discharge. Pt is pending possible PEG placement. Subjective: pt alert, speaks when prompted to do so Assessment / Plan / Recommendation CHL IP CLINICAL IMPRESSIONS 05/30/2016 Therapy Diagnosis Mild pharyngeal phase dysphagia Clinical Impression Patient presents with a mild pharyngeal phase dysphagia, exacerbated by AMS with  poor sustained attention/easily distracted. Patient with a delay in swallow initiation resulting in intermittent silent aspiration of thin liquids, prevented with nectar thick during today's study although unable to r/o episodic aspiration of thickened liquids as well. Oral phase functional with prolonged mastication of soft solids due to missing dentition. Pharyngeal strength WFL. Will continue nectar thick liquids and monitor at bedside for tolerance. Ability to advance liquid textures relies primarily on mentation.  Impact on safety and function Mild aspiration risk   CHL IP TREATMENT RECOMMENDATION 05/30/2016 Treatment Recommendations Therapy as outlined in treatment plan below   Prognosis 05/30/2016 Prognosis for Safe Diet Advancement Fair Barriers to Reach Goals Cognitive deficits Barriers/Prognosis Comment -- CHL IP DIET RECOMMENDATION 05/30/2016 SLP Diet Recommendations Dysphagia 2 (Fine chop) solids;Nectar thick liquid Liquid Administration via Cup;No straw Medication Administration Whole meds with puree Compensations Slow rate;Small sips/bites;Minimize environmental distractions Postural Changes Seated upright at 90 degrees   CHL IP OTHER RECOMMENDATIONS 05/30/2016 Recommended Consults -- Oral Care Recommendations Oral care BID Other Recommendations Order thickener from pharmacy;Prohibited food (jello, ice cream, thin soups);Remove water pitcher   CHL IP FOLLOW UP RECOMMENDATIONS 05/30/2016 Follow up Recommendations Skilled Nursing facility   [facility] IP FREQUENCY AND DURATION 05/30/2016 Speech Therapy Frequency (ACUTE ONLY) min 2x/week Treatment Duration 2 weeks      CHL IP ORAL PHASE 05/30/2016 Oral Phase WFL Oral - Pudding Teaspoon -- Oral - Pudding Cup -- Oral - Honey Teaspoon -- Oral - Honey Cup -- Oral - Nectar Teaspoon -- Oral - Nectar Cup -- Oral - Nectar Straw -- Oral - Thin Teaspoon -- Oral - Thin Cup -- Oral - Thin Straw -- Oral - Puree -- Oral - Mech Soft -- Oral - Regular -- Oral - Multi-Consistency --  Oral - Pill -- Oral Phase - Comment --  CHL IP PHARYNGEAL PHASE 05/30/2016 Pharyngeal Phase Impaired Pharyngeal- Pudding Teaspoon -- Pharyngeal -- Pharyngeal- Pudding Cup -- Pharyngeal -- Pharyngeal- Honey Teaspoon -- Pharyngeal -- Pharyngeal- Honey Cup -- Pharyngeal -- Pharyngeal- Nectar Teaspoon -- Pharyngeal -- Pharyngeal- Nectar Cup Delayed swallow initiation-pyriform sinuses;Delayed swallow initiation-vallecula Pharyngeal -- Pharyngeal- Nectar Straw -- Pharyngeal -- Pharyngeal- Thin Teaspoon Delayed swallow initiation-pyriform sinuses Pharyngeal -- Pharyngeal- Thin Cup Delayed swallow initiation-pyriform sinuses;Penetration/Aspiration during swallow;Moderate aspiration Pharyngeal Material enters  airway, passes BELOW cords without attempt by patient to eject out (silent aspiration) Pharyngeal- Thin Straw Delayed swallow initiation-pyriform sinuses;Penetration/Aspiration during swallow;Moderate aspiration Pharyngeal Material enters airway, passes BELOW cords without attempt by patient to eject out (silent aspiration) Pharyngeal- Puree Delayed swallow initiation-vallecula Pharyngeal -- Pharyngeal- Mechanical Soft Delayed swallow initiation-vallecula Pharyngeal -- Pharyngeal- Regular -- Pharyngeal -- Pharyngeal- Multi-consistency -- Pharyngeal -- Pharyngeal- Pill -- Pharyngeal -- Pharyngeal Comment --  CHL IP CERVICAL ESOPHAGEAL PHASE 05/30/2016 Cervical Esophageal Phase WFL Pudding Teaspoon -- Pudding Cup -- Honey Teaspoon -- Honey Cup -- Nectar Teaspoon -- Nectar Cup -- Nectar Straw -- Thin Teaspoon -- Thin Cup -- Thin Straw -- Puree -- Mechanical Soft -- Regular -- Multi-consistency -- Pill -- Cervical Esophageal Comment -- CHL IP GO 03/09/2016 Functional Assessment Tool Used skilled clinical judgement Functional Limitations Spoken language expressive Swallow Current Status [phone]) CJ Swallow Goal Status (U0454) CI Swallow Discharge Status (U9811) (None) Motor Speech Current Status (B1478) (None) Motor Speech Goal  Status (G9562) (None) Motor Speech Goal Status (Z3086) (None) Spoken Language Comprehension Current Status (V7846) (None) Spoken Language Comprehension Goal Status (N6295) (None) Spoken Language Comprehension Discharge Status (M8413) (None) Spoken Language Expression Current Status (K4401) (None) Spoken Language Expression Goal Status (U2725) (None) Spoken Language Expression Discharge Status [phone]) (None) Attention Current Status (I3474) (None) Attention Goal Status (Q5956) (None) Attention Discharge Status (L8756) (None) Memory Current Status (E3329) (None) Memory Goal Status (J1884) (None) Memory Discharge Status (Z6606) (None) Voice Current Status (T0160) (None) Voice Goal Status (F0932) (None) Voice Discharge Status (T5573) (None) Other Speech-Language Pathology Functional Limitation (U2025) (None) Other Speech-Language Pathology Functional Limitation Goal Status (K2706) (None) Other Speech-Language Pathology Functional Limitation Discharge Status [phone]) (None) Ferdinand Lango MA, CCC-SLP [phone] McCoy Leah Meryl 05/30/2016, 1:07 PM                  Subjective: No new complaints.   Discharge Exam: Filed Vitals:   05/31/16 0800 05/31/16 1202  BP: 103/88 113/82  Pulse: 51 67  Temp: 97.5 F (36.4 C) 98 F (36.7 C)  Resp: 9 9   Filed Vitals:   05/30/16 2326 05/31/16 0317 05/31/16 0800 05/31/16 1202  BP: 113/74 99/83 103/88 113/82  Pulse: 70  51 67  Temp: 97.7 F (36.5 C) 97.5 F (36.4 C) 97.5 F (36.4 C) 98 F (36.7 C)  TempSrc: Axillary Oral Axillary Axillary  Resp: 14 9 9 9   Height:      Weight:      SpO2:  98% 99% 98%    General: Pt is alert, awake, not in acute distress Cardiovascular: RRR, S1/S2 +, no rubs, no gallops Respiratory: CTA bilaterally, no wheezing, no rhonchi Abdominal: Soft, NT, ND, bowel sounds + Extremities: no edema, no cyanosis    The results of significant diagnostics from this hospitalization (including imaging, microbiology, ancillary and  laboratory) are listed below for reference.     Microbiology: Recent Results (from the past 240 hour(s))  Culture, blood (Routine X 2) w Reflex to ID Panel     Status: None   Collection Time: 05/23/16  2:29 PM  Result Value Ref Range Status   Specimen Description BLOOD LEFT HAND  Final   Special Requests IN PEDIATRIC BOTTLE 3CC  Final   Culture NO GROWTH 5 DAYS  Final   Report Status 05/28/2016 FINAL  Final  Culture, blood (Routine X 2) w Reflex to ID Panel     Status: None   Collection Time: 05/23/16  2:38 PM  Result Value Ref  Range Status   Specimen Description BLOOD LEFT HAND  Final   Special Requests IN PEDIATRIC BOTTLE  1CC  Final   Culture NO GROWTH 5 DAYS  Final   Report Status 05/28/2016 FINAL  Final  Urine culture     Status: Abnormal   Collection Time: 05/25/16  2:09 AM  Result Value Ref Range Status   Specimen Description URINE, CATHETERIZED  Final   Special Requests NONE  Final   Culture <10,000 COLONIES/mL INSIGNIFICANT GROWTH (A)  Final   Report Status 05/26/2016 FINAL  Final  Culture, blood (Routine X 2) w Reflex to ID Panel     Status: None (Preliminary result)   Collection Time: 05/29/16 11:55 AM  Result Value Ref Range Status   Specimen Description BLOOD LEFT ARM  Final   Special Requests IN PEDIATRIC BOTTLE 3CC  Final   Culture NO GROWTH 1 DAY  Final   Report Status PENDING  Incomplete  Culture, blood (Routine X 2) w Reflex to ID Panel     Status: None (Preliminary result)   Collection Time: 05/29/16 12:03 PM  Result Value Ref Range Status   Specimen Description BLOOD LEFT HAND  Final   Special Requests IN PEDIATRIC BOTTLE 3CC  Final   Culture NO GROWTH 1 DAY  Final   Report Status PENDING  Incomplete     Labs: BNP (last 3 results)  Recent Labs  03/20/16 1800  BNP 23.8   Basic Metabolic Panel:  Recent Labs Lab 05/25/16 0319 05/27/16 0430 05/29/16 1121  NA 140 140 142  K 5.0 4.6 4.2  CL 103 102 103  CO2 29 32 30  GLUCOSE 87 99 76  BUN 20  30* 27*  CREATININE 0.91 1.03 0.72  CALCIUM 9.0 9.4 9.5  MG 2.2  --   --   PHOS 6.5*  --   --    Liver Function Tests:  Recent Labs Lab 05/29/16 1121  AST 26  ALT 61  ALKPHOS 72  BILITOT 0.1*  PROT 6.0*  ALBUMIN 2.5*   No results for input(s): LIPASE, AMYLASE in the last 168 hours. No results for input(s): AMMONIA in the last 168 hours. CBC:  Recent Labs Lab 05/25/16 0319 05/27/16 0430 05/29/16 1121  WBC 10.2 8.0 8.2  HGB 8.5* 9.0* 9.3*  HCT 25.9* 28.1* 29.3*  MCV 86.0 88.1 88.0  PLT 94* 214 439*   Cardiac Enzymes: No results for input(s): CKTOTAL, CKMB, CKMBINDEX, TROPONINI in the last 168 hours. BNP: Invalid input(s): POCBNP CBG:  Recent Labs Lab 05/27/16 1246 05/27/16 1637 05/27/16 1957 05/28/16 0030 05/28/16 0526  GLUCAP 109* 119* 84 101* 101*   D-Dimer No results for input(s): DDIMER in the last 72 hours. Hgb A1c No results for input(s): HGBA1C in the last 72 hours. Lipid Profile No results for input(s): CHOL, HDL, LDLCALC, TRIG, CHOLHDL, LDLDIRECT in the last 72 hours. Thyroid function studies No results for input(s): TSH, T4TOTAL, T3FREE, THYROIDAB in the last 72 hours.  Invalid input(s): FREET3 Anemia work up No results for input(s): VITAMINB12, FOLATE, FERRITIN, TIBC, IRON, RETICCTPCT in the last 72 hours. Urinalysis    Component Value Date/Time   COLORURINE YELLOW 05/29/2016 1853   APPEARANCEUR CLEAR 05/29/2016 1853   LABSPEC 1.019 05/29/2016 1853   PHURINE 6.0 05/29/2016 1853   GLUCOSEU NEGATIVE 05/29/2016 1853   HGBUR NEGATIVE 05/29/2016 1853   BILIRUBINUR NEGATIVE 05/29/2016 1853   KETONESUR NEGATIVE 05/29/2016 1853   PROTEINUR NEGATIVE 05/29/2016 1853   UROBILINOGEN 1.0 11/13/2011 2010  NITRITE NEGATIVE 05/29/2016 1853   LEUKOCYTESUR NEGATIVE 05/29/2016 1853   Sepsis Labs Invalid input(s): PROCALCITONIN,  WBC,  LACTICIDVEN Microbiology Recent Results (from the past 240 hour(s))  Culture, blood (Routine X 2) w Reflex to ID  Panel     Status: None   Collection Time: 05/23/16  2:29 PM  Result Value Ref Range Status   Specimen Description BLOOD LEFT HAND  Final   Special Requests IN PEDIATRIC BOTTLE 3CC  Final   Culture NO GROWTH 5 DAYS  Final   Report Status 05/28/2016 FINAL  Final  Culture, blood (Routine X 2) w Reflex to ID Panel     Status: None   Collection Time: 05/23/16  2:38 PM  Result Value Ref Range Status   Specimen Description BLOOD LEFT HAND  Final   Special Requests IN PEDIATRIC BOTTLE  1CC  Final   Culture NO GROWTH 5 DAYS  Final   Report Status 05/28/2016 FINAL  Final  Urine culture     Status: Abnormal   Collection Time: 05/25/16  2:09 AM  Result Value Ref Range Status   Specimen Description URINE, CATHETERIZED  Final   Special Requests NONE  Final   Culture <10,000 COLONIES/mL INSIGNIFICANT GROWTH (A)  Final   Report Status 05/26/2016 FINAL  Final  Culture, blood (Routine X 2) w Reflex to ID Panel     Status: None (Preliminary result)   Collection Time: 05/29/16 11:55 AM  Result Value Ref Range Status   Specimen Description BLOOD LEFT ARM  Final   Special Requests IN PEDIATRIC BOTTLE 3CC  Final   Culture NO GROWTH 1 DAY  Final   Report Status PENDING  Incomplete  Culture, blood (Routine X 2) w Reflex to ID Panel     Status: None (Preliminary result)   Collection Time: 05/29/16 12:03 PM  Result Value Ref Range Status   Specimen Description BLOOD LEFT HAND  Final   Special Requests IN PEDIATRIC BOTTLE 3CC  Final   Culture NO GROWTH 1 DAY  Final   Report Status PENDING  Incomplete     Time coordinating discharge: Over 30 minutes  SIGNED:   Kathlen Mody, MD  [facility] 05/31/2016, 2:12 PM Pager [phone]   If 7PM-7AM, please contact night-coverage [email or web address] Password [email or web address]

## 2016-05-31 NOTE — Progress Notes (Signed)
[facility] decided they are unable to take patient back due to safety concerns with pt wanders- per facility pt wandering into other patients room- they think he will benefit from locked unit  [facility] into [facility]  Pt currently on mitts- no SNF able to accept until pt can be off mittens for 24 hours- facility can accept if pt is successful without restraints overnight  CSW paged MD to inform and asked RN to remove mitts.  CSW will continue to follow  Merlyn LotJenna Holoman, [facility] HospitalCSWA Clinical Social Worker [phone]

## 2016-06-01 LAB — GLUCOSE, CAPILLARY: Glucose-Capillary: 84 mg/dL (ref 65–99)

## 2016-06-01 MED ORDER — THIAMINE HCL 100 MG PO TABS: 100.0000 mg | ORAL_TABLET | Freq: Every day | ORAL | Status: DC

## 2016-06-01 MED ORDER — FOLIC ACID 1 MG PO TABS: 1.0000 mg | ORAL_TABLET | Freq: Every day | ORAL | Status: DC

## 2016-06-01 MED ORDER — FAMOTIDINE 20 MG PO TABS: 20.0000 mg | ORAL_TABLET | Freq: Every day | ORAL | Status: DC

## 2016-06-01 MED ORDER — ASPIRIN 325 MG PO TABS: 325.0000 mg | ORAL_TABLET | Freq: Every day | ORAL | Status: DC

## 2016-06-01 NOTE — Progress Notes (Signed)
[facility], PICC removed by IV team no bleeding noted, already called transport.

## 2016-06-01 NOTE — Progress Notes (Signed)
[facility] Anticipated discharge date: 6/28 Family notified: pt wife Transportation by [facility]- RN to call once PICC line removed  CSW signing off.  Merlyn LotJenna Holoman, LCSWA Clinical Social Worker [phone]

## 2016-06-01 NOTE — Progress Notes (Addendum)
Patient seen and examined today.  He has back pain which is chronic, ongoing, but denies shortness of breath, nausea, vomiting, diarrhea.  He is able to nod his head and state a few single words to answer my questions.  VSS.  Telemetry demonstrated sinus rhythm.   Thin male, no acute distress.  Regular rate and rhythm.  Rales at the right base, no wheeze or rhonchi.  No lower extremity edema.  Mild left hemiparesis.  Discharge summary reviewed.  Changed a few medications to be administered by mouth instead of "per tube."  Patient stable for discharge to SNF today.

## 2016-06-01 NOTE — Discharge Summary (Addendum)
Physician Discharge Summary  Dale [name] WUJ:[id number] DOB: 1966/10/10 DOA: 05/19/2016  PCP: No primary care provider on file.  Admit date: 05/19/2016 Discharge date: 06/03/2016  Admitted From: SNF Disposition:  SNF  Recommendations for Outpatient Follow-up:  1. Follow up with PCP in 1-2 weeks 2. Please obtain BMP/CBC in one week 3. Please follow up with SLP to see if  Your diet can be advanced.  4. Please follow up with CXR in 2 to 4 weeks to evaluate for resolution of the aspiration pneumonia.     Discharge Condition:guarded CODE STATUS:full code.  Diet recommendation: Dysphagia 1 with nectar thick liquids.   Brief/Interim Summary: 50 year old male with h/o strep pneumonia meningitis, dysphagia, dementia and hospital course complicated by cardiac arrest and cardiogenic shock in March 2016, ARF, shock liver, adrenal insufficiency presents with acute encephalopathy.   Recent Events in the last 3 months;  3/14-4/6: Admitted with strep pneumo meningitis, c/b cardiac arrest, and cardiogenic shock (EF 20%, recovered to 50%) briefly on amiodarone, ARDS requiring prolonged intubation and trach (now removed), ARF, shock liver, and adrenal insufficiency. He was discharged on 4/6 with weekly Pen G IM until 4/10.  4/16-5/1: Readmitted with fall, altered mental status. Repeat LP had no growth, TEE without vegetation, ultimately transitioned to ceftriaxone 2g BID with plans for >4 weeks IV antibiotics. During this hospitalization, he was planning for SNF placement until the day of discharge when the patient's brother asked to take him home (it appears that this wasn't verified with the patient's estranged but legally married wife).  Since discharge, patient has returned to the ED 4 times because of loss of PICC line, PICC requiring to be flushed, PICC coming out, or patient wandering unattended in the street (he has dementia since his meningitis).  5/24-5/26: Readmitted for placement to SNF,  given additional week of rocephin and was supposed to follow up with ID before stopping rocephin.  On 6/15 patient came from NH, due to acute encephalopathy, seizure, . He was restarted on vancomycin and rocephin for encephalopathy and sepsis of unclear etiology .   6/21 patient transferred to [facility] service frm PCCM service.  6/26 suspect pt had aspiration pneumonia and started him on IV zosyn and SLP eval with MBS, Started him on dysphagia 1 diet with nectar thick liquids.   Discharge Diagnoses:  Principal Problem:   Septic shock (HCC) Active Problems:   Acute encephalopathy   Meningitis, pneumococcal, recurrent   Cerebral septic emboli (HCC)   Moderate protein-calorie malnutrition (HCC)   Dementia   Convulsions/seizures (HCC)   Dysphagia   Hypothermia   Acute respiratory failure with hypoxemia (HCC)  Acute Respiratory Failure on admission ; unclear etiology.  Resolved not requiring oxygen.  Duuonebs, NT suctioning as needed.   Dysphagia on NG tube :  NG tube discontinued on 6/24 after SLP evaluation done and recommended to start dysphagia 1 diet with nectar thick liquids   He does not need any G tube anymore.  MBS done and SLP recommended dysphagia 1 diet with nectar thick.  Recommend SLP eval on discharge.      Recurrent Pneumococcal meningitis Plan was to discontinue rocephin by 5/26 and to be seen by ID prior to discontinuing the antibiotics.  But patient never followed up with ID. His rocephin was restarted on 6/15 Along with IV vancomycin on admission, for possible sepsis,. His blood cultures from 6/19 have been negative.  Discussed with Dr Drue Second over the phone, recommended stopping the antibiotic as cultures have  been negative.  Stopped IV rocephin on 6/24.       Septic shock on admission: unclear etiology.  He was restarted on IV vancomycin and IV Rocephin on 6/15 and vancomycin discontinued on 6/19.  Rocephin was discontinued on 6/24 after  discussing with ID.  Blood cultures drawn on 6/19 have been negative so far. Repeat blood cultures drawn on 6/24 and have been negative till date.  Repeat CXR shows resolving infection in the left mid and lower lung opacity.    Acute encephalopathy from ? sepsis and recurrent meningitis.  He at baseline is non verbal, can only give one word answers and follow simple commands.  He is very unsteady on his feet at baseline.  Neurology consulted for the episode of seizures he had at the facility prior to admission.  EEG done showed moderate generalized slowing of brain activity consistent with a global brain injury such as seen in hypoxia, metabolic, toxic, or infectious etiologies.  Neurology recommended restarting his dose of depakote and keppra. increased the dose of keppra from 500 bid to 1000 mg bID.  No seizures since restarting his meds.     Hypothermia, more lethargic on 6/25; bp borderline. Ordered fluid bolus NS 1 LIT today. Blood cultures ordered.  Lactic acid, pro calcitonin,labs, portable CXR, and UA ordered. lactic acid is normal. procalcitonin is negative.  CXR shows worsening opacification, suspect aspiration , started the patient on IV zosyn changed to augmentin on discharge.     Hyperkalemia: resolved.   Acute Kidney Injury: - probably from sepsis and dehydration.  - improved.    Protein calorie malnutrition, moderate: Dietary recommendations.  RESTARTED DIET.  Bradycardia: Did not restart his coreg on discharge.   Discharge Instructions      Discharge Instructions    Discharge instructions    Complete by:  As directed   Please follow up with PCP in one week and get a repeat CXR in 2 weeks for resolution of the pneumonia.  Diet is dysphagia 1 diet with nectar thick liquids.            Medication List    STOP taking these medications        carvedilol 3.125 MG tablet  Commonly known as:  COREG     famotidine 40 MG/5ML suspension   Commonly known as:  PEPCID  Replaced by:  famotidine 20 MG tablet     HEPARIN LOCK FLUSH IV     TUBERCULIN PPD ID      TAKE these medications        albuterol (2.5 MG/3ML) 0.083% nebulizer solution  Commonly known as:  PROVENTIL  Take 3 mLs (2.5 mg total) by nebulization every 3 (three) hours as needed for wheezing.     amoxicillin-clavulanate 875-125 MG tablet  Commonly known as:  AUGMENTIN  Take 1 tablet by mouth 2 (two) times daily.     aspirin 325 MG tablet  Take 1 tablet (325 mg total) by mouth daily.     atorvastatin 20 MG tablet  Commonly known as:  LIPITOR  Take 1 tablet (20 mg total) by mouth daily at 6 PM.     famotidine 20 MG tablet  Commonly known as:  PEPCID  Take 1 tablet (20 mg total) by mouth daily.     feeding supplement (ENSURE ENLIVE) Liqd  Take 237 mLs by mouth every 6 (six) hours.     folic acid 1 MG tablet  Commonly known as:  FOLVITE  Take 1 tablet (1  mg total) by mouth daily.     levETIRAcetam 100 MG/ML solution  Commonly known as:  KEPPRA  Take 10 mLs (1,000 mg total) by mouth 2 (two) times daily.     LORazepam 0.5 MG tablet  Commonly known as:  ATIVAN  Take 0.5 mg by mouth every 8 (eight) hours as needed for anxiety or sedation.     RESOURCE THICKENUP CLEAR Powd  As needed.     tamsulosin 0.4 MG Caps capsule  Commonly known as:  FLOMAX  Take 1 capsule (0.4 mg total) by mouth daily.     thiamine 100 MG tablet  Take 1 tablet (100 mg total) by mouth daily.     valproic acid 250 MG/5ML syrup  Commonly known as:  DEPAKENE  Take 5 mLs (250 mg total) by mouth every 8 (eight) hours.       Follow-up Information    Follow up with [facility] .   Specialty:  Skilled Nursing Facility   Contact information:   [address] [place] [id number] [phone]     No Known Allergies  Consultations:  PCCM.   Procedures/Studies: Ct Abdomen Wo Contrast  05/27/2016  CLINICAL DATA:  Septic shock and anatomic  assessment prior to possible percutaneous gastrostomy tube placement. EXAM: CT ABDOMEN WITHOUT CONTRAST TECHNIQUE: Multidetector CT imaging of the abdomen was performed following the standard protocol without IV contrast. COMPARISON:  None. FINDINGS: Lower chest:  Left basilar atelectasis present. Hepatobiliary: Unremarkable unenhanced appearance of the liver and gallbladder. Pancreas: Unremarkable unenhanced appearance of the pancreas. Spleen: Unremarkable spleen. Adrenals/Urinary Tract: No evidence of hydronephrosis or urinary tract calculi. Stomach/Bowel: A feeding tube is present which extends through the stomach and duodenum and into the proximal jejunum. The stomach is very high in location and transversely oriented with much of the stomach posterior to the left lobe of the liver. No evidence of bowel obstruction, free air or abscess. Vascular/Lymphatic: No enlarged lymph nodes are identified. Sparse calcified plaque of the abdominal aorta present consistent with atherosclerosis. Other: No hernias. Musculoskeletal: Unremarkable visualized bony structures. IMPRESSION: 1. Transverse stomach with much of the stomach posterior to the left lobe of the liver. Although this may make percutaneous gastrostomy tube placement more difficult, with insufflation of the stomach, there may be a safe window present to perform gastrostomy tube placement. 2. Aortic atherosclerosis. 3. Current feeding tube extends into the proximal jejunum. Electronically Signed   By: Irish LackGlenn  Yamagata M.D.   On: 05/27/2016 16:41   Ct Head Wo Contrast  05/19/2016  CLINICAL DATA:  Seizure.  Recent diagnosis of meningitis.  Dementia. EXAM: CT HEAD WITHOUT CONTRAST TECHNIQUE: Contiguous axial images were obtained from the base of the skull through the vertex without intravenous contrast. COMPARISON:  03/22/2016 FINDINGS: Brain: Mild diffuse atrophy as before. No evidence of acute infarction, hemorrhage, extra-axial collection, ventriculomegaly, or  mass effect. Vascular: No hyperdense vessel or unexpected calcification. Skull: Negative for fracture or focal lesion. Sinuses/Orbits: No acute findings. Other: None. IMPRESSION: 1. Mild atrophy. Negative for bleed or other acute intracranial process. Electronically Signed   By: Corlis Leak  Hassell M.D.   On: 05/19/2016 15:56   [place] Abdomen Limited  05/19/2016  CLINICAL DATA:  Abnormal liver function tests. EXAM: US ABDOMEN LIMITED - RIGHT UPPER QUADRANT COMPARISON:  11/13/2011 CT scan FINDINGS: Gallbladder: Multiple tiny gallstones are present measuring up to 3 mm diameter. These cause mild shadowing. There also appears to be tumefactive sludge in the gallbladder especially along the fundus, surrounding  a small gallstone. This is mobile and does not demonstrate internal blood flow to suggest a polyp. There could be smaller polyps along the gallbladder wall given the specular tiny echogenicities along the gallbladder wall. Common bile duct: Diameter: 3 mm Liver: No focal lesion identified. Within normal limits in parenchymal echogenicity. IMPRESSION: 1. Multiple small gallstones in the gallbladder. There is some tumefactive sludge surrounding one of the gallstones, mobile within the gallbladder. The other gallstones are in the 3 mm size range. There may be some tiny polyps along the gallbladder wall but none measuring more than 3 mm in diameter. Hyperplastic cholecystosis with superimposed sludge and tiny gallstones not excluded. No overt gallbladder wall thickening. Sonographic Murphy's sign absent. Electronically Signed   By: Gaylyn Rong M.D.   On: 05/19/2016 18:57   Dg Chest Port 1 View  05/29/2016  CLINICAL DATA:  Sepsis EXAM: PORTABLE CHEST 1 VIEW COMPARISON:  05/27/2016 chest radiograph. FINDINGS: Right PICC terminates in the middle third of the superior vena cava. Stable cardiomediastinal silhouette with normal heart size. No pneumothorax. No pleural effusion. Patchy left lower lobe consolidation is  slightly increased. No pulmonary edema. IMPRESSION: Slightly increased patchy left lower lobe consolidation, suggesting pneumonia and/ or aspiration. Recommend follow-up chest imaging to resolution. Electronically Signed   By: Delbert Phenix M.D.   On: 05/29/2016 12:30   Dg Chest Port 1 View  05/27/2016  CLINICAL DATA:  50 year old male with fever, acute encephalopathy, seizure. Streptococcus meningitis in March, with hospital course at that time complicated by cardiac arrest and cardiogenic shock. Initial encounter. EXAM: PORTABLE CHEST 1 VIEW COMPARISON:  05/24/2016 and earlier. FINDINGS: Portable AP semi upright view at 1117 hours. Feeding tube in place, appears to loop in the stomach with tip pointed slightly retrograde at the proximal gastric body (arrow). Right PICC line remains in place. Asymmetric reticulonodular opacity in the left mid and lower lung has mildly regressed. Upper lobes remain clear. Right lung remains clear. No pneumothorax, pulmonary edema, pleural effusion or areas of worsening ventilation. Mediastinal contours remain normal. IMPRESSION: 1. Regressed but not resolved left mid and lower lung reticulonodular opacity compatible with improving left lung infection. 2. No pleural effusion or new cardiopulmonary abnormality. 3. Feeding tube looped in the stomach. Electronically Signed   By: Odessa Fleming M.D.   On: 05/27/2016 11:33   Dg Chest Port 1 View  05/24/2016  CLINICAL DATA:  Respiratory failure. EXAM: PORTABLE CHEST 1 VIEW COMPARISON:  05/21/2016. FINDINGS: Feeding tube and right PICC line stable position. Bibasilar atelectasis. Left lower lobe infiltrate with slight interim improvement. No pleural effusion or pneumothorax. IMPRESSION: 1.  Lines and tubes stable position. 2. Low lung volumes with bibasilar atelectasis. Left lower lobe infiltrate with slight improvement from prior exam . Electronically Signed   By: Maisie Fus  Register   On: 05/24/2016 06:56   Dg Chest Port 1 View  05/21/2016   CLINICAL DATA:  Pneumonia EXAM: PORTABLE CHEST 1 VIEW COMPARISON:  Chest radiograph from one day prior. FINDINGS: Right PICC terminates in the lower third of the superior vena cava. Enteric tube terminates in the proximal stomach. Stable cardiomediastinal silhouette with normal heart size. No pneumothorax. No pleural effusion. Patchy consolidation in the mid to lower left lung is increased. IMPRESSION: Increased patchy consolidation in the mid to lower left lung, most consistent with pneumonia. Electronically Signed   By: Delbert Phenix M.D.   On: 05/21/2016 08:35   Dg Chest Portable 1 View  05/20/2016  CLINICAL DATA:  Increased shortness  of breath tonight. EXAM: PORTABLE CHEST 1 VIEW COMPARISON:  05/09/2016 FINDINGS: PICC line tip in the SVC region. Slightly increased densities at the lung bases, left side greater than right. Upper lungs are clear. Heart size is normal and within normal limits. The trachea is midline. IMPRESSION: Increased densities along the medial lung bases bilaterally. Findings could represent atelectasis. Subtle infectious process is possible. Electronically Signed   By: Richarda Overlie M.D.   On: 05/20/2016 01:07   Dg Chest Port 1 View  05/19/2016  CLINICAL DATA:  Hypothermia EXAM: PORTABLE CHEST 1 VIEW COMPARISON:  03/20/2016 FINDINGS: The patient is rotated to the left on today's radiograph, reducing diagnostic sensitivity and specificity. Right PICC line tip: SVC. The lungs appear clear. Cardiac and mediastinal contours normal. No pleural effusion identified. IMPRESSION: 1. No acute findings. 2. Right PICC line tip:  SVC. Electronically Signed   By: Gaylyn Rong M.D.   On: 05/19/2016 15:04   Dg Abd Portable 1v  05/25/2016  CLINICAL DATA:  Nasogastric tube placement.  Initial encounter. EXAM: PORTABLE ABDOMEN - 1 VIEW COMPARISON:  Abdominal radiograph performed 05/20/2016 FINDINGS: The patient's enteric tube is seen coiling within the stomach. This will likely progress further with  time. The visualized bowel gas pattern is unremarkable. Scattered air and stool filled loops of colon are seen; no abnormal dilatation of small bowel loops is seen to suggest small bowel obstruction. No free intra-abdominal air is identified, though evaluation for free air is limited on a single supine view. The visualized osseous structures are within normal limits; the sacroiliac joints are unremarkable in appearance. The visualized portions of the lungs are essentially clear. IMPRESSION: Enteric tube noted coiling within the stomach. This will likely progress further with time. Electronically Signed   By: Roanna Raider M.D.   On: 05/25/2016 02:06   Dg Abd Portable 1v  05/20/2016  CLINICAL DATA:  Feeding tube placement EXAM: PORTABLE ABDOMEN - 1 VIEW COMPARISON:  03/29/2016 FINDINGS: Feeding tube tip at the ligament of Treitz. Normal bowel gas pattern. Increase in left basilar opacity seen on comparison chest x-ray. IMPRESSION: 1. Feeding tube tip at the ligament of Treitz. 2. Increase in left basilar airspace disease compared to chest x-ray earlier today, concerning for aspiration or pneumonia. Electronically Signed   By: Marnee Spring M.D.   On: 05/20/2016 13:06   Dg Swallowing Func-speech Pathology  05/30/2016  Objective Swallowing Evaluation: Type of Study: MBS-Modified Barium Swallow Study Patient Details Name: Dale [name] MRN: [id number] Date of Birth: February 26, 1966 Today's Date: 05/30/2016 Time: SLP Start Time (ACUTE ONLY): 1225-SLP Stop Time (ACUTE ONLY): 1242 SLP Time Calculation (min) (ACUTE ONLY): 17 min Past Medical History: Past Medical History Diagnosis Date . Depression  . Cardiac arrest (HCC)  . Cardiogenic shock (HCC)  . Acute respiratory failure with hypoxia (HCC)  . Meningitis, streptococcal 02/16/2016 . Trichomonal urethritis in male 02/16/2016 . Tobacco abuse 02/16/2016 . Substance abuse  . ETOH abuse  . Septic shock (HCC)  . Urinary retention 05/04/2016   Urinary retention - Continue Flomax     . Systolic and diastolic CHF, chronic (HCC)  . 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 Severe sepsis (HCC)  . Respiratory failure (HCC) 03/03/2016 . Protein-calorie malnutrition, severe (HCC)  . NSTEMI (non-ST elevated myocardial infarction) (HCC) 03/14/2016 . Meningoencephalitis  . Meningitis, pneumococcal, recurrent  . Low serum cortisol level (HCC) 05/04/2016   Low a.m. cortisol  - Was 1.2 checked on 04/03/2016  - This likely is reflective of cortisol suppression from steroid  use - Tapered steroids off  . Hypothermia 03/20/2016 . Hypernatremia  . Hyperlipidemia 05/04/2016   Dyslipidemia - Continue statin.    Marland Kitchen Hydrocephalus  . Dysphagia 05/04/2016   Dysphagia - Dysphagia 1 diet per SLP    . Dementia 04/27/2016 . Convulsions/seizures (HCC) 05/04/2016   Seizure disorder - Continue Keppra, Depakote.    . CHF (congestive heart failure) (HCC)  . Cerebral thrombosis with cerebral infarction 03/25/2016 . Cerebral septic emboli (HCC)  . Altered mental status  . AKI (acute kidney injury) (HCC) 02/16/2016 . Acute renal failure (HCC)  . Acute ischemic stroke (HCC)  . Acute encephalopathy  Past Surgical History: Past Surgical History Procedure Laterality Date . Ankle surgery   . Cardiac catheterization N/A 02/17/2016   Procedure: IABP Insertion;  Surgeon: Iran Ouch, MD;  Location: [facility] INVASIVE CV LAB;  Service: Cardiovascular;  Laterality: N/A; . Tee without cardioversion N/A 03/29/2016   Procedure: TRANSESOPHAGEAL ECHOCARDIOGRAM (TEE);  Surgeon: Laurey Morale, MD;  [facility] ENDOSCOPY;  Service: Cardiovascular;  Laterality: N/A; HPI: 50 year old male with h/o strep pneumonia meningitis, dysphagia, dementia and hospital course complicated by cardiac arrest and cardiogenic shock in March 2016, ARF, shock liver, adrenal insufficiency presents with acute encephalopathy. SLP evaluations during previous admissions have recommended Dys 1 diet and nectar thick liquids with advancement to Dys 2 textures prior to discharge. Pt is  pending possible PEG placement. Subjective: pt alert, speaks when prompted to do so Assessment / Plan / Recommendation CHL IP CLINICAL IMPRESSIONS 05/30/2016 Therapy Diagnosis Mild pharyngeal phase dysphagia Clinical Impression Patient presents with a mild pharyngeal phase dysphagia, exacerbated by AMS with poor sustained attention/easily distracted. Patient with a delay in swallow initiation resulting in intermittent silent aspiration of thin liquids, prevented with nectar thick during today's study although unable to r/o episodic aspiration of thickened liquids as well. Oral phase functional with prolonged mastication of soft solids due to missing dentition. Pharyngeal strength WFL. Will continue nectar thick liquids and monitor at bedside for tolerance. Ability to advance liquid textures relies primarily on mentation.  Impact on safety and function Mild aspiration risk   CHL IP TREATMENT RECOMMENDATION 05/30/2016 Treatment Recommendations Therapy as outlined in treatment plan below   Prognosis 05/30/2016 Prognosis for Safe Diet Advancement Fair Barriers to Reach Goals Cognitive deficits Barriers/Prognosis Comment -- CHL IP DIET RECOMMENDATION 05/30/2016 SLP Diet Recommendations Dysphagia 2 (Fine chop) solids;Nectar thick liquid Liquid Administration via Cup;No straw Medication Administration Whole meds with puree Compensations Slow rate;Small sips/bites;Minimize environmental distractions Postural Changes Seated upright at 90 degrees   CHL IP OTHER RECOMMENDATIONS 05/30/2016 Recommended Consults -- Oral Care Recommendations Oral care BID Other Recommendations Order thickener from pharmacy;Prohibited food (jello, ice cream, thin soups);Remove water pitcher   CHL IP FOLLOW UP RECOMMENDATIONS 05/30/2016 Follow up Recommendations Skilled Nursing facility   [facility] IP FREQUENCY AND DURATION 05/30/2016 Speech Therapy Frequency (ACUTE ONLY) min 2x/week Treatment Duration 2 weeks      CHL IP ORAL PHASE 05/30/2016 Oral Phase WFL Oral  - Pudding Teaspoon -- Oral - Pudding Cup -- Oral - Honey Teaspoon -- Oral - Honey Cup -- Oral - Nectar Teaspoon -- Oral - Nectar Cup -- Oral - Nectar Straw -- Oral - Thin Teaspoon -- Oral - Thin Cup -- Oral - Thin Straw -- Oral - Puree -- Oral - Mech Soft -- Oral - Regular -- Oral - Multi-Consistency -- Oral - Pill -- Oral Phase - Comment --  CHL IP PHARYNGEAL PHASE 05/30/2016 Pharyngeal Phase Impaired Pharyngeal-  Pudding Teaspoon -- Pharyngeal -- Pharyngeal- Pudding Cup -- Pharyngeal -- Pharyngeal- Honey Teaspoon -- Pharyngeal -- Pharyngeal- Honey Cup -- Pharyngeal -- Pharyngeal- Nectar Teaspoon -- Pharyngeal -- Pharyngeal- Nectar Cup Delayed swallow initiation-pyriform sinuses;Delayed swallow initiation-vallecula Pharyngeal -- Pharyngeal- Nectar Straw -- Pharyngeal -- Pharyngeal- Thin Teaspoon Delayed swallow initiation-pyriform sinuses Pharyngeal -- Pharyngeal- Thin Cup Delayed swallow initiation-pyriform sinuses;Penetration/Aspiration during swallow;Moderate aspiration Pharyngeal Material enters airway, passes BELOW cords without attempt by patient to eject out (silent aspiration) Pharyngeal- Thin Straw Delayed swallow initiation-pyriform sinuses;Penetration/Aspiration during swallow;Moderate aspiration Pharyngeal Material enters airway, passes BELOW cords without attempt by patient to eject out (silent aspiration) Pharyngeal- Puree Delayed swallow initiation-vallecula Pharyngeal -- Pharyngeal- Mechanical Soft Delayed swallow initiation-vallecula Pharyngeal -- Pharyngeal- Regular -- Pharyngeal -- Pharyngeal- Multi-consistency -- Pharyngeal -- Pharyngeal- Pill -- Pharyngeal -- Pharyngeal Comment --  CHL IP CERVICAL ESOPHAGEAL PHASE 05/30/2016 Cervical Esophageal Phase WFL Pudding Teaspoon -- Pudding Cup -- Honey Teaspoon -- Honey Cup -- Nectar Teaspoon -- Nectar Cup -- Nectar Straw -- Thin Teaspoon -- Thin Cup -- Thin Straw -- Puree -- Mechanical Soft -- Regular -- Multi-consistency -- Pill -- Cervical Esophageal  Comment -- CHL IP GO 03/09/2016 Functional Assessment Tool Used skilled clinical judgement Functional Limitations Spoken language expressive Swallow Current Status [phone]) CJ Swallow Goal Status (U0454) CI Swallow Discharge Status (U9811) (None) Motor Speech Current Status (B1478) (None) Motor Speech Goal Status (G9562) (None) Motor Speech Goal Status (Z3086) (None) Spoken Language Comprehension Current Status (V7846) (None) Spoken Language Comprehension Goal Status (N6295) (None) Spoken Language Comprehension Discharge Status [phone] (None) Spoken Language Expression Current Status (K4401) (None) Spoken Language Expression Goal Status (U2725) (None) Spoken Language Expression Discharge Status [phone]) (None) Attention Current Status (I3474) (None) Attention Goal Status (Q5956) (None) Attention Discharge Status (L8756) (None) Memory Current Status (E3329) (None) Memory Goal Status (J1884) (None) Memory Discharge Status (Z6606) (None) Voice Current Status (T0160) (None) Voice Goal Status (F0932) (None) Voice Discharge Status (T5573) (None) Other Speech-Language Pathology Functional Limitation [phone]) (None) Other Speech-Language Pathology Functional Limitation Goal Status (K2706) (None) Other Speech-Language Pathology Functional Limitation Discharge Status [phone]) (None) Ferdinand Lango MA, CCC-SLP [phone] [place] Leah Meryl 05/30/2016, 1:07 PM                 Subjective: He has back pain which is chronic, ongoing, but denies shortness of breath, nausea, vomiting, diarrhea. He is able to nod his head and state a few single words to answer my questions.  Discharge Exam: Filed Vitals:   06/01/16 1224 06/01/16 1700  BP: 84/67 91/64  Pulse: 58 61  Temp: 97.8 F (36.6 C) 97.8 F (36.6 C)  Resp: 16 10   Filed Vitals:   06/01/16 0400 06/01/16 0812 06/01/16 1224 06/01/16 1700  BP: 112/62 105/78 84/67 91/64   Pulse: 56 54 58 61  Temp: 97.6 F (36.4 C) 97.6 F (36.4 C) 97.8 F (36.6 C) 97.8 F (36.6 C)   TempSrc: Oral Oral Oral Oral  Resp: 12 10 16 10   Height:      Weight:      SpO2: 98% 100% 100% 99%    Thin male, no acute distress. Regular rate and rhythm. Rales at the right base, no wheeze or rhonchi. No lower extremity edema. Mild left hemiparesis.   The results of significant diagnostics from this hospitalization (including imaging, microbiology, ancillary and laboratory) are listed below for reference.     Microbiology: Recent Results (from the past 240 hour(s))  Urine culture     Status: Abnormal   Collection  Time: 05/25/16  2:09 AM  Result Value Ref Range Status   Specimen Description URINE, CATHETERIZED  Final   Special Requests NONE  Final   Culture <10,000 COLONIES/mL INSIGNIFICANT GROWTH (A)  Final   Report Status 05/26/2016 FINAL  Final  Culture, blood (Routine X 2) w Reflex to ID Panel     Status: None   Collection Time: 05/29/16 11:55 AM  Result Value Ref Range Status   Specimen Description BLOOD LEFT ARM  Final   Special Requests IN PEDIATRIC BOTTLE 3CC  Final   Culture NO GROWTH 5 DAYS  Final   Report Status 06/03/2016 FINAL  Final  Culture, blood (Routine X 2) w Reflex to ID Panel     Status: None   Collection Time: 05/29/16 12:03 PM  Result Value Ref Range Status   Specimen Description BLOOD LEFT HAND  Final   Special Requests IN PEDIATRIC BOTTLE 3CC  Final   Culture NO GROWTH 5 DAYS  Final   Report Status 06/03/2016 FINAL  Final     Labs: BNP (last 3 results)  Recent Labs  03/20/16 1800  BNP 23.8   Basic Metabolic Panel:  Recent Labs Lab 05/29/16 1121  NA 142  K 4.2  CL 103  CO2 30  GLUCOSE 76  BUN 27*  CREATININE 0.72  CALCIUM 9.5   Liver Function Tests:  Recent Labs Lab 05/29/16 1121  AST 26  ALT 61  ALKPHOS 72  BILITOT 0.1*  PROT 6.0*  ALBUMIN 2.5*   No results for input(s): LIPASE, AMYLASE in the last 168 hours. No results for input(s): AMMONIA in the last 168 hours. CBC:  Recent Labs Lab 05/29/16 1121  WBC  8.2  HGB 9.3*  HCT 29.3*  MCV 88.0  PLT 439*   Cardiac Enzymes: No results for input(s): CKTOTAL, CKMB, CKMBINDEX, TROPONINI in the last 168 hours. BNP: Invalid input(s): POCBNP CBG:  Recent Labs Lab 05/27/16 1957 05/28/16 0030 05/28/16 0526 05/31/16 2358  GLUCAP 84 101* 101* 84   D-Dimer No results for input(s): DDIMER in the last 72 hours. Hgb A1c No results for input(s): HGBA1C in the last 72 hours. Lipid Profile No results for input(s): CHOL, HDL, LDLCALC, TRIG, CHOLHDL, LDLDIRECT in the last 72 hours. Thyroid function studies No results for input(s): TSH, T4TOTAL, T3FREE, THYROIDAB in the last 72 hours.  Invalid input(s): FREET3 Anemia work up No results for input(s): VITAMINB12, FOLATE, FERRITIN, TIBC, IRON, RETICCTPCT in the last 72 hours. Urinalysis    Component Value Date/Time   COLORURINE YELLOW 05/29/2016 1853   APPEARANCEUR CLEAR 05/29/2016 1853   LABSPEC 1.019 05/29/2016 1853   PHURINE 6.0 05/29/2016 1853   GLUCOSEU NEGATIVE 05/29/2016 1853   HGBUR NEGATIVE 05/29/2016 1853   BILIRUBINUR NEGATIVE 05/29/2016 1853   KETONESUR NEGATIVE 05/29/2016 1853   PROTEINUR NEGATIVE 05/29/2016 1853   UROBILINOGEN 1.0 11/13/2011 2010   NITRITE NEGATIVE 05/29/2016 1853   LEUKOCYTESUR NEGATIVE 05/29/2016 1853   Sepsis Labs Invalid input(s): PROCALCITONIN,  WBC,  LACTICIDVEN Microbiology Recent Results (from the past 240 hour(s))  Urine culture     Status: Abnormal   Collection Time: 05/25/16  2:09 AM  Result Value Ref Range Status   Specimen Description URINE, CATHETERIZED  Final   Special Requests NONE  Final   Culture <10,000 COLONIES/mL INSIGNIFICANT GROWTH (A)  Final   Report Status 05/26/2016 FINAL  Final  Culture, blood (Routine X 2) w Reflex to ID Panel     Status: None   Collection Time: 05/29/16  11:55 AM  Result Value Ref Range Status   Specimen Description BLOOD LEFT ARM  Final   Special Requests IN PEDIATRIC BOTTLE 3CC  Final   Culture NO GROWTH 5  DAYS  Final   Report Status 06/03/2016 FINAL  Final  Culture, blood (Routine X 2) w Reflex to ID Panel     Status: None   Collection Time: 05/29/16 12:03 PM  Result Value Ref Range Status   Specimen Description BLOOD LEFT HAND  Final   Special Requests IN PEDIATRIC BOTTLE 3CC  Final   Culture NO GROWTH 5 DAYS  Final   Report Status 06/03/2016 FINAL  Final     Time coordinating discharge: Over 30 minutes  SIGNED:   Renae Fickle, MD  [facility] 06/03/2016, 6:23 PM Pager [phone]   If 7PM-7AM, please contact night-coverage [email or web address] Password [email or web address]

## 2016-06-01 NOTE — Progress Notes (Signed)
Pt just left via [facility], alert, vital signs stable.

## 2016-06-03 LAB — CULTURE, BLOOD (ROUTINE X 2)
CULTURE: NO GROWTH
Culture: NO GROWTH

## 2016-06-16 ENCOUNTER — Inpatient Hospital Stay (HOSPITAL_COMMUNITY): Payer: Medicaid Other

## 2016-06-16 ENCOUNTER — Inpatient Hospital Stay (HOSPITAL_COMMUNITY)
Admission: EM | Admit: 2016-06-16 | Discharge: 2016-07-11 | DRG: 870 | Disposition: A | Discharge status: Skilled Nursing Facility | Payer: Medicaid Other | Attending: Internal Medicine | Admitting: Internal Medicine

## 2016-06-16 ENCOUNTER — Emergency Department (HOSPITAL_COMMUNITY): Payer: Medicaid Other

## 2016-06-16 ENCOUNTER — Encounter (HOSPITAL_COMMUNITY): Payer: Self-pay | Admitting: *Deleted

## 2016-06-16 DIAGNOSIS — I110 Hypertensive heart disease with heart failure: Secondary | ICD-10-CM | POA: Diagnosis present

## 2016-06-16 DIAGNOSIS — Z7982 Long term (current) use of aspirin: Secondary | ICD-10-CM | POA: Diagnosis not present

## 2016-06-16 DIAGNOSIS — R4182 Altered mental status, unspecified: Secondary | ICD-10-CM

## 2016-06-16 DIAGNOSIS — E271 Primary adrenocortical insufficiency: Secondary | ICD-10-CM | POA: Diagnosis present

## 2016-06-16 DIAGNOSIS — E440 Moderate protein-calorie malnutrition: Secondary | ICD-10-CM | POA: Diagnosis present

## 2016-06-16 DIAGNOSIS — I5042 Chronic combined systolic (congestive) and diastolic (congestive) heart failure: Secondary | ICD-10-CM | POA: Diagnosis present

## 2016-06-16 DIAGNOSIS — J189 Pneumonia, unspecified organism: Secondary | ICD-10-CM | POA: Diagnosis present

## 2016-06-16 DIAGNOSIS — I959 Hypotension, unspecified: Secondary | ICD-10-CM

## 2016-06-16 DIAGNOSIS — E162 Hypoglycemia, unspecified: Secondary | ICD-10-CM | POA: Diagnosis not present

## 2016-06-16 DIAGNOSIS — I10 Essential (primary) hypertension: Secondary | ICD-10-CM | POA: Diagnosis not present

## 2016-06-16 DIAGNOSIS — Z4659 Encounter for fitting and adjustment of other gastrointestinal appliance and device: Secondary | ICD-10-CM

## 2016-06-16 DIAGNOSIS — R569 Unspecified convulsions: Secondary | ICD-10-CM | POA: Diagnosis not present

## 2016-06-16 DIAGNOSIS — R4701 Aphasia: Secondary | ICD-10-CM

## 2016-06-16 DIAGNOSIS — G9340 Encephalopathy, unspecified: Secondary | ICD-10-CM | POA: Diagnosis present

## 2016-06-16 DIAGNOSIS — J9602 Acute respiratory failure with hypercapnia: Secondary | ICD-10-CM

## 2016-06-16 DIAGNOSIS — E785 Hyperlipidemia, unspecified: Secondary | ICD-10-CM | POA: Diagnosis present

## 2016-06-16 DIAGNOSIS — J9600 Acute respiratory failure, unspecified whether with hypoxia or hypercapnia: Secondary | ICD-10-CM | POA: Diagnosis not present

## 2016-06-16 DIAGNOSIS — I429 Cardiomyopathy, unspecified: Secondary | ICD-10-CM | POA: Diagnosis not present

## 2016-06-16 DIAGNOSIS — G40901 Epilepsy, unspecified, not intractable, with status epilepticus: Secondary | ICD-10-CM | POA: Diagnosis present

## 2016-06-16 DIAGNOSIS — Z978 Presence of other specified devices: Secondary | ICD-10-CM

## 2016-06-16 DIAGNOSIS — Z6821 Body mass index (BMI) 21.0-21.9, adult: Secondary | ICD-10-CM

## 2016-06-16 DIAGNOSIS — Z452 Encounter for adjustment and management of vascular access device: Secondary | ICD-10-CM

## 2016-06-16 DIAGNOSIS — D696 Thrombocytopenia, unspecified: Secondary | ICD-10-CM | POA: Diagnosis present

## 2016-06-16 DIAGNOSIS — A419 Sepsis, unspecified organism: Principal | ICD-10-CM | POA: Diagnosis present

## 2016-06-16 DIAGNOSIS — F1290 Cannabis use, unspecified, uncomplicated: Secondary | ICD-10-CM | POA: Diagnosis present

## 2016-06-16 DIAGNOSIS — J9601 Acute respiratory failure with hypoxia: Secondary | ICD-10-CM | POA: Diagnosis present

## 2016-06-16 DIAGNOSIS — R339 Retention of urine, unspecified: Secondary | ICD-10-CM | POA: Diagnosis not present

## 2016-06-16 DIAGNOSIS — F0390 Unspecified dementia without behavioral disturbance: Secondary | ICD-10-CM | POA: Diagnosis present

## 2016-06-16 DIAGNOSIS — R6521 Severe sepsis with septic shock: Secondary | ICD-10-CM | POA: Diagnosis present

## 2016-06-16 DIAGNOSIS — R400 Somnolence: Secondary | ICD-10-CM | POA: Diagnosis not present

## 2016-06-16 DIAGNOSIS — T68XXXA Hypothermia, initial encounter: Secondary | ICD-10-CM

## 2016-06-16 DIAGNOSIS — Z79899 Other long term (current) drug therapy: Secondary | ICD-10-CM

## 2016-06-16 DIAGNOSIS — E876 Hypokalemia: Secondary | ICD-10-CM | POA: Diagnosis present

## 2016-06-16 DIAGNOSIS — J9690 Respiratory failure, unspecified, unspecified whether with hypoxia or hypercapnia: Secondary | ICD-10-CM

## 2016-06-16 DIAGNOSIS — Y95 Nosocomial condition: Secondary | ICD-10-CM | POA: Diagnosis present

## 2016-06-16 DIAGNOSIS — E2740 Unspecified adrenocortical insufficiency: Secondary | ICD-10-CM | POA: Diagnosis not present

## 2016-06-16 DIAGNOSIS — I252 Old myocardial infarction: Secondary | ICD-10-CM

## 2016-06-16 DIAGNOSIS — E872 Acidosis: Secondary | ICD-10-CM | POA: Diagnosis present

## 2016-06-16 DIAGNOSIS — R001 Bradycardia, unspecified: Secondary | ICD-10-CM | POA: Diagnosis not present

## 2016-06-16 DIAGNOSIS — W19XXXA Unspecified fall, initial encounter: Secondary | ICD-10-CM

## 2016-06-16 DIAGNOSIS — E870 Hyperosmolality and hypernatremia: Secondary | ICD-10-CM | POA: Diagnosis present

## 2016-06-16 DIAGNOSIS — L8990 Pressure ulcer of unspecified site, unspecified stage: Secondary | ICD-10-CM | POA: Insufficient documentation

## 2016-06-16 DIAGNOSIS — F17210 Nicotine dependence, cigarettes, uncomplicated: Secondary | ICD-10-CM | POA: Diagnosis present

## 2016-06-16 DIAGNOSIS — Z515 Encounter for palliative care: Secondary | ICD-10-CM | POA: Diagnosis not present

## 2016-06-16 DIAGNOSIS — D638 Anemia in other chronic diseases classified elsewhere: Secondary | ICD-10-CM | POA: Diagnosis present

## 2016-06-16 LAB — URINALYSIS, ROUTINE W REFLEX MICROSCOPIC
Bilirubin Urine: NEGATIVE
GLUCOSE, UA: NEGATIVE mg/dL
Hgb urine dipstick: NEGATIVE
Ketones, ur: NEGATIVE mg/dL
LEUKOCYTES UA: NEGATIVE
Nitrite: NEGATIVE
PROTEIN: NEGATIVE mg/dL
SPECIFIC GRAVITY, URINE: 1.014 (ref 1.005–1.030)
pH: 5.5 (ref 5.0–8.0)

## 2016-06-16 LAB — TROPONIN I

## 2016-06-16 LAB — BLOOD GAS, ARTERIAL
Acid-base deficit: 2 mmol/L (ref 0.0–2.0)
Acid-base deficit: 4 mmol/L — ABNORMAL HIGH (ref 0.0–2.0)
Bicarbonate: 23.5 mEq/L (ref 20.0–24.0)
Bicarbonate: 18.7 mEq/L — ABNORMAL LOW (ref 20.0–24.0)
DRAWN BY: 11249
Drawn by: 11249
FIO2: 1
LHR: 14 {breaths}/min
MECHVT: 620 mL
O2 Content: 2 L/min
O2 SAT: 97 %
O2 Saturation: 99.5 %
PATIENT TEMPERATURE: 35
PCO2 ART: 24.7 mmHg — AB (ref 35.0–45.0)
PEEP/CPAP: 5 cmH2O
PH ART: 7.378 (ref 7.350–7.450)
PO2 ART: 199 mmHg — AB (ref 80.0–100.0)
Patient temperature: 32.7
TCO2: 22.8 mmol/L (ref 0–100)
TCO2: 17.3 mmol/L (ref 0–100)
pCO2 arterial: 38.4 mmHg (ref 35.0–45.0)
pH, Arterial: 7.482 — ABNORMAL HIGH (ref 7.350–7.450)
pO2, Arterial: 82.7 mmHg (ref 80.0–100.0)

## 2016-06-16 LAB — CBC WITH DIFFERENTIAL/PLATELET
BASOS PCT: 0 %
Basophils Absolute: 0 10*3/uL (ref 0.0–0.1)
Eosinophils Absolute: 0 10*3/uL (ref 0.0–0.7)
Eosinophils Relative: 0 %
HCT: 33.2 % — ABNORMAL LOW (ref 39.0–52.0)
Hemoglobin: 10.9 g/dL — ABNORMAL LOW (ref 13.0–17.0)
LYMPHS ABS: 1.1 10*3/uL (ref 0.7–4.0)
Lymphocytes Relative: 18 %
MCH: 29 pg (ref 26.0–34.0)
MCHC: 32.8 g/dL (ref 30.0–36.0)
MCV: 88.3 fL (ref 78.0–100.0)
MONO ABS: 0.3 10*3/uL (ref 0.1–1.0)
Monocytes Relative: 5 %
NEUTROS ABS: 4.7 10*3/uL (ref 1.7–7.7)
Neutrophils Relative %: 77 %
PLATELETS: 224 10*3/uL (ref 150–400)
RBC: 3.76 MIL/uL — ABNORMAL LOW (ref 4.22–5.81)
RDW: 16.2 % — AB (ref 11.5–15.5)
WBC: 6.1 10*3/uL (ref 4.0–10.5)

## 2016-06-16 LAB — COMPREHENSIVE METABOLIC PANEL
ALK PHOS: 59 U/L (ref 38–126)
ALT: 23 U/L (ref 17–63)
AST: 23 U/L (ref 15–41)
Albumin: 2.6 g/dL — ABNORMAL LOW (ref 3.5–5.0)
Anion gap: 6 (ref 5–15)
BUN: 14 mg/dL (ref 6–20)
CALCIUM: 8.6 mg/dL — AB (ref 8.9–10.3)
CHLORIDE: 112 mmol/L — AB (ref 101–111)
CO2: 26 mmol/L (ref 22–32)
CREATININE: 0.76 mg/dL (ref 0.61–1.24)
GFR calc Af Amer: >60 mL/min (ref >60)
GFR calc non Af Amer: >60 mL/min (ref >60)
GLUCOSE: 80 mg/dL (ref 65–99)
Potassium: 4.9 mmol/L (ref 3.5–5.1)
SODIUM: 144 mmol/L (ref 135–145)
Total Bilirubin: 0.6 mg/dL (ref 0.3–1.2)
Total Protein: 5.2 g/dL — ABNORMAL LOW (ref 6.5–8.1)

## 2016-06-16 LAB — CREATININE, SERUM
Creatinine, Ser: 0.83 mg/dL (ref 0.61–1.24)
GFR calc Af Amer: >60 mL/min (ref >60)

## 2016-06-16 LAB — RAPID URINE DRUG SCREEN, HOSP PERFORMED
Amphetamines: NOT DETECTED
BENZODIAZEPINES: NOT DETECTED
Barbiturates: NOT DETECTED
COCAINE: NOT DETECTED
Opiates: NOT DETECTED
Tetrahydrocannabinol: NOT DETECTED

## 2016-06-16 LAB — CBG MONITORING, ED: Glucose-Capillary: 85 mg/dL (ref 65–99)

## 2016-06-16 LAB — I-STAT CG4 LACTIC ACID, ED
LACTIC ACID, VENOUS: 0.97 mmol/L (ref 0.5–1.9)
Lactic Acid, Venous: 0.73 mmol/L (ref 0.5–1.9)

## 2016-06-16 LAB — VALPROIC ACID LEVEL: Valproic Acid Lvl: 15 ug/mL — ABNORMAL LOW (ref 50.0–100.0)

## 2016-06-16 LAB — PROCALCITONIN: Procalcitonin: 0.11 ng/mL

## 2016-06-16 LAB — AMMONIA: AMMONIA: 23 umol/L (ref 9–35)

## 2016-06-16 MED ORDER — PIPERACILLIN-TAZOBACTAM 3.375 G IVPB: 3.3750 g | Freq: Three times a day (TID) | INTRAVENOUS | Status: DC

## 2016-06-16 MED ORDER — THIAMINE HCL 100 MG/ML IJ SOLN
100.0000 mg | Freq: Every day | INTRAMUSCULAR | Status: DC
  Administered 2016-06-16 – 2016-06-18 (×3): 100 mg via INTRAVENOUS
  Filled 2016-06-16 (×3): qty 2

## 2016-06-16 MED ORDER — SODIUM CHLORIDE 0.9 % IV SOLN
1000.0000 mg | Freq: Two times a day (BID) | INTRAVENOUS | Status: DC
  Administered 2016-06-16 – 2016-06-17 (×3): 1000 mg via INTRAVENOUS
  Filled 2016-06-16 (×4): qty 10

## 2016-06-16 MED ORDER — VALPROATE SODIUM 250 MG/5ML PO SOLN
250.0000 mg | Freq: Three times a day (TID) | ORAL | Status: DC
  Administered 2016-06-16: 250 mg
  Filled 2016-06-16 (×2): qty 5

## 2016-06-16 MED ORDER — VANCOMYCIN HCL IN DEXTROSE 1-5 GM/200ML-% IV SOLN
1000.0000 mg | INTRAVENOUS | Status: AC
  Administered 2016-06-16: 1000 mg via INTRAVENOUS
  Filled 2016-06-16: qty 200

## 2016-06-16 MED ORDER — ASPIRIN 325 MG PO TABS
325.0000 mg | ORAL_TABLET | Freq: Every day | ORAL | Status: DC
  Administered 2016-06-16 – 2016-07-11 (×25): 325 mg
  Filled 2016-06-16 (×25): qty 1

## 2016-06-16 MED ORDER — SODIUM CHLORIDE 0.9 % IV BOLUS (SEPSIS)
500.0000 mL | Freq: Once | INTRAVENOUS | Status: AC
  Administered 2016-06-16: 500 mL via INTRAVENOUS

## 2016-06-16 MED ORDER — INSULIN ASPART 100 UNIT/ML ~~LOC~~ SOLN
2.0000 [IU] | SUBCUTANEOUS | Status: DC
  Administered 2016-06-17: 2 [IU] via SUBCUTANEOUS
  Administered 2016-06-17 (×2): 4 [IU] via SUBCUTANEOUS
  Administered 2016-06-18 – 2016-06-26 (×6): 2 [IU] via SUBCUTANEOUS

## 2016-06-16 MED ORDER — VANCOMYCIN HCL IN DEXTROSE 750-5 MG/150ML-% IV SOLN
750.0000 mg | Freq: Two times a day (BID) | INTRAVENOUS | Status: DC
  Administered 2016-06-17 – 2016-06-18 (×3): 750 mg via INTRAVENOUS
  Filled 2016-06-16 (×5): qty 150

## 2016-06-16 MED ORDER — HYDROCORTISONE NA SUCCINATE PF 100 MG IJ SOLR
50.0000 mg | Freq: Four times a day (QID) | INTRAMUSCULAR | Status: DC
  Administered 2016-06-16 – 2016-06-20 (×15): 50 mg via INTRAVENOUS
  Filled 2016-06-16 (×13): qty 2

## 2016-06-16 MED ORDER — PIPERACILLIN-TAZOBACTAM 3.375 G IVPB
3.3750 g | Freq: Three times a day (TID) | INTRAVENOUS | Status: DC
  Administered 2016-06-17 – 2016-06-23 (×19): 3.375 g via INTRAVENOUS
  Filled 2016-06-16 (×21): qty 50

## 2016-06-16 MED ORDER — HEPARIN SODIUM (PORCINE) 5000 UNIT/ML IJ SOLN
5000.0000 [IU] | Freq: Three times a day (TID) | INTRAMUSCULAR | Status: DC
  Administered 2016-06-17 – 2016-06-19 (×7): 5000 [IU] via SUBCUTANEOUS
  Filled 2016-06-16 (×6): qty 1

## 2016-06-16 MED ORDER — ATORVASTATIN CALCIUM 20 MG PO TABS
20.0000 mg | ORAL_TABLET | Freq: Every day | ORAL | Status: DC
  Administered 2016-06-17 – 2016-06-23 (×7): 20 mg
  Filled 2016-06-16 (×7): qty 1

## 2016-06-16 MED ORDER — FENTANYL CITRATE (PF) 100 MCG/2ML IJ SOLN
100.0000 ug | Freq: Once | INTRAMUSCULAR | Status: AC
  Administered 2016-06-16: 100 ug via INTRAVENOUS
  Filled 2016-06-16: qty 2

## 2016-06-16 MED ORDER — SODIUM CHLORIDE 0.9 % IV BOLUS (SEPSIS)
1000.0000 mL | Freq: Once | INTRAVENOUS | Status: AC
  Administered 2016-06-16: 1000 mL via INTRAVENOUS

## 2016-06-16 MED ORDER — FOLIC ACID 5 MG/ML IJ SOLN
1.0000 mg | Freq: Every day | INTRAMUSCULAR | Status: DC
  Administered 2016-06-16 – 2016-06-18 (×3): 1 mg via INTRAVENOUS
  Filled 2016-06-16 (×4): qty 0.2

## 2016-06-16 MED ORDER — LORAZEPAM 2 MG/ML IJ SOLN
2.0000 mg | Freq: Once | INTRAMUSCULAR | Status: AC
  Administered 2016-06-16: 2 mg via INTRAVENOUS
  Filled 2016-06-16: qty 1

## 2016-06-16 MED ORDER — SODIUM CHLORIDE 0.9 % IV SOLN
250.0000 mL | INTRAVENOUS | Status: DC | PRN
  Administered 2016-06-23: 250 mL via INTRAVENOUS

## 2016-06-16 MED ORDER — LORAZEPAM 2 MG/ML IJ SOLN
1.0000 mg | Freq: Once | INTRAMUSCULAR | Status: AC
  Administered 2016-06-16: 1 mg via INTRAVENOUS
  Filled 2016-06-16: qty 1

## 2016-06-16 MED ORDER — DEXTROSE-NACL 5-0.45 % IV SOLN
INTRAVENOUS | Status: DC
  Administered 2016-06-16 – 2016-06-26 (×10): via INTRAVENOUS

## 2016-06-16 MED ORDER — PIPERACILLIN-TAZOBACTAM 3.375 G IVPB 30 MIN
3.3750 g | INTRAVENOUS | Status: AC
  Administered 2016-06-16: 3.375 g via INTRAVENOUS
  Filled 2016-06-16: qty 50

## 2016-06-16 MED ORDER — DEXTROSE 5 % IV SOLN
0.0000 ug/min | INTRAVENOUS | Status: DC
  Administered 2016-06-16: 2 ug/min via INTRAVENOUS
  Administered 2016-06-17: 15.013 ug/min via INTRAVENOUS
  Administered 2016-06-17: 25 ug/min via INTRAVENOUS
  Filled 2016-06-16 (×5): qty 4

## 2016-06-16 MED ORDER — SODIUM CHLORIDE 0.9 % IV BOLUS (SEPSIS)
250.0000 mL | Freq: Once | INTRAVENOUS | Status: AC
  Administered 2016-06-16: 250 mL via INTRAVENOUS

## 2016-06-16 NOTE — Progress Notes (Signed)
Pharmacy Antibiotic Note  Dale [name] is a 50 y.o. male with PMHx significant for recent strep pneumo meningitis with prolonged antibiotic course, stroke, dementia, seizures, CHF, and substance abuse, admitted on 06/16/2016 with possible aspiration and sepsis.  CXR shows LLL PNA.  Pharmacy has been consulted for Vancomycin and Zosyn dosing.   CrCl estimated ~85 ml/min (N>100)  Plan: Vanc 1g IV x1, then 750mg  q12h Zosyn 3.375g IV x 1 over 30 min, then q8h (infuse over 4 hours) F/u renal function, cultures, VT at Css as warranted, clinical course   No data recorded.  No results for input(s): WBC, CREATININE, LATICACIDVEN, VANCOTROUGH, VANCOPEAK, VANCORANDOM, GENTTROUGH, GENTPEAK, GENTRANDOM, TOBRATROUGH, TOBRAPEAK, TOBRARND, AMIKACINPEAK, AMIKACINTROU, AMIKACIN in the last 168 hours.  CrCl cannot be calculated (Unknown ideal weight.).    No Known Allergies  Antimicrobials this admission: PTA Ceftriaxone 7/13 Vancomycin >>  7/13 Zosyn >>   Dose adjustments this admission: Recent Vanc Troughs: 4/20 = 22 on Vanc 500 q12h (SCr = 1.4-1.8) 4/23 = 12 on Vanc 1g q24h (SCr = 1.3-1.5) 6/19 = 29 on Vanc 750mg  q12h (1.1-1.2)  Microbiology results: 7/13 BCx2: ordered 7/13 UCx: ordered   Thank you for allowing pharmacy to be a part of this patient's care.  Dale [name], PharmD, BCPS 06/16/2016, 5:28 PM  Pager: [phone]

## 2016-06-16 NOTE — ED Provider Notes (Signed)
CSN: [id number]     Arrival date & time 06/16/16  1640 History   First MD Initiated Contact with Patient 06/16/16 1706     Chief Complaint  Patient presents with  . Aspiration  . Possible Sepsis      (Consider location/radiation/quality/duration/timing/severity/associated sxs/prior Treatment) HPI    Dale [name] is a 50 y.o. male presents from skilled nursing facility for evaluation of "not acting right". Also noted to be hypotensive, and hypothermic at the facility. He was transferred by EMS. He is unable give any history.   Level V caveat- altered mental status     Past Medical History  Diagnosis Date  . Depression   . Cardiac arrest (HCC)   . Cardiogenic shock (HCC)   . Acute respiratory failure with hypoxia (HCC)   . Meningitis, streptococcal 02/16/2016  . 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 Tobacco abuse 02/16/2016  . Substance abuse   . ETOH abuse   . Septic shock (HCC)   . Urinary retention 05/04/2016    Urinary retention - Continue Flomax     . Systolic and diastolic CHF, chronic (HCC)   . 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 Severe sepsis (HCC)   . Respiratory failure (HCC) 03/03/2016  . Protein-calorie malnutrition, severe (HCC)   . NSTEMI (non-ST elevated myocardial infarction) (HCC) 03/14/2016  . Meningoencephalitis   . Meningitis, pneumococcal, recurrent   . Low serum cortisol level (HCC) 05/04/2016    Low a.m. cortisol  - Was 1.2 checked on 04/03/2016  - This likely is reflective of cortisol suppression from steroid use - Tapered steroids off   . Hypothermia 03/20/2016  . Hypernatremia   . Hyperlipidemia 05/04/2016    Dyslipidemia - Continue statin.     Marland Kitchen. Hydrocephalus   . Dysphagia 05/04/2016    Dysphagia - Dysphagia 1 diet per SLP     . Dementia 04/27/2016  . Convulsions/seizures (HCC) 05/04/2016    Seizure disorder - Continue Keppra, Depakote.     . CHF (congestive heart failure) (HCC)   . Cerebral thrombosis with cerebral infarction  03/25/2016  . Cerebral septic emboli (HCC)   . Altered mental status   . AKI (acute kidney injury) (HCC) 02/16/2016  . Acute renal failure (HCC)   . Acute ischemic stroke (HCC)   . Acute encephalopathy    Past Surgical History  Procedure Laterality Date  . Ankle surgery    . Cardiac catheterization N/A 02/17/2016    Procedure: IABP Insertion;  Surgeon: Iran OuchMuhammad A Arida, MD;  Location: [facility];  Service: Cardiovascular;  Laterality: N/A;  . Tee without cardioversion N/A 03/29/2016    Procedure: TRANSESOPHAGEAL ECHOCARDIOGRAM (TEE);  Surgeon: Laurey Moralealton S McLean, MD;  [facility];  Service: Cardiovascular;  Laterality: N/A;   History reviewed. No pertinent family history. Social History  Substance Use Topics  . Smoking status: Current Every Day Smoker -- 0.10 packs/day    Types: Cigarettes  . Smokeless tobacco: None  . Alcohol Use: No    Review of Systems  Unable to perform ROS: Mental status change      Allergies  Review of patient's allergies indicates no known allergies.  Home Medications   Prior to Admission medications   Medication Sig Start Date End Date Taking? Authorizing Provider  albuterol (PROVENTIL) (2.5 MG/3ML) 0.083% nebulizer solution Take 3 mLs (2.5 mg total) by nebulization every 3 (three) hours as needed for wheezing. Patient taking differently: Take 2.5 mg by nebulization every 3 (three) hours as needed for  wheezing or shortness of breath.  03/10/16  Yes Richarda Overlie, MD  aspirin 325 MG tablet Take 1 tablet (325 mg total) by mouth daily. 06/01/16  Yes Renae Fickle, MD  atorvastatin (LIPITOR) 20 MG tablet Take 1 tablet (20 mg total) by mouth daily at 6 PM. 04/04/16  Yes Alison Murray, MD  CefTRIAXone Sodium (ROCEPHIN IV) Inject 1 g into the vein daily. For 7 days   Yes Historical Provider, MD  famotidine (PEPCID) 20 MG tablet Take 1 tablet (20 mg total) by mouth daily. 06/01/16  Yes Renae Fickle, MD  feeding supplement, ENSURE ENLIVE, (ENSURE  ENLIVE) LIQD Take 237 mLs by mouth every 6 (six) hours. 05/31/16  Yes Kathlen Mody, MD  folic acid (FOLVITE) 1 MG tablet Take 1 tablet (1 mg total) by mouth daily. 06/01/16  Yes Renae Fickle, MD  levETIRAcetam (KEPPRA) 100 MG/ML solution Take 10 mLs (1,000 mg total) by mouth 2 (two) times daily. 05/31/16  Yes Kathlen Mody, MD  LORazepam (ATIVAN) 0.5 MG tablet Take 0.5 mg by mouth every 8 (eight) hours as needed for anxiety.    Yes Historical Provider, MD  Maltodextrin-Xanthan Gum (RESOURCE THICKENUP CLEAR) POWD As needed. 05/31/16  Yes Kathlen Mody, MD  tamsulosin (FLOMAX) 0.4 MG CAPS capsule Take 1 capsule (0.4 mg total) by mouth daily. 04/04/16  Yes Alison Murray, MD  thiamine 100 MG tablet Take 1 tablet (100 mg total) by mouth daily. 06/01/16  Yes Renae Fickle, MD  valproic acid (DEPAKENE) 250 MG/5ML syrup Take 5 mLs (250 mg total) by mouth every 8 (eight) hours. 05/31/16  Yes Kathlen Mody, MD  amoxicillin-clavulanate (AUGMENTIN) 875-125 MG tablet Take 1 tablet by mouth 2 (two) times daily. Patient not taking: Reported on 06/16/2016 05/31/16   Kathlen Mody, MD   BP 104/80 mmHg  Pulse 108  Temp(Src) 97.9 F (36.6 C) (Core (Comment))  Resp 14  Ht 6' (1.829 m)  Wt 121 lb 4.1 oz (55 kg)  BMI 16.44 kg/m2  SpO2 100% Physical Exam  Constitutional: He appears well-developed.  Appears older than stated age  HENT:  Head: Normocephalic and atraumatic.  Right Ear: External ear normal.  Left Ear: External ear normal.  Eyes: Conjunctivae and EOM are normal. Pupils are equal, round, and reactive to light.  Neck: Normal range of motion and phonation normal. Neck supple.  Cardiovascular: Normal rate, regular rhythm and normal heart sounds.   Hypotension is present  Pulmonary/Chest: Effort normal and breath sounds normal. No respiratory distress. He has no wheezes. He exhibits no bony tenderness.  Abdominal: Soft. There is no tenderness.  Musculoskeletal:  No deformities of the arms or legs.   Neurological: No cranial nerve deficit or sensory deficit.  Nonresponsive to verbal or painful stimuli. Left eye deviation to the left. Clonus of left arm. Rhythmic twitching of the lips, upper and lower.  No abnormal movements of the right arm or legs. No facial asymmetry.  Skin: Skin is warm, dry and intact.  Psychiatric: He has a normal mood and affect. His behavior is normal. Judgment and thought content normal.  Nursing note and vitals reviewed.   ED Course  .Central Line Date/Time: 06/16/2016 8:21 PM Performed by: Mancel Bale Authorized by: Mancel Bale Consent: The procedure was performed in an emergent situation. Verbal consent not obtained. Required items: required blood products, implants, devices, and special equipment available Patient identity confirmed: arm band and provided demographic data Time out: Immediately prior to procedure a "time out" was called to verify the  correct patient, procedure, equipment, support staff and site/side marked as required. Indications: vascular access Anesthesia: local infiltration Local anesthetic: lidocaine 1% without epinephrine Anesthetic total: 2 ml Patient sedated: no Preparation: skin prepped with 2% chlorhexidine Skin prep agent dried: skin prep agent completely dried prior to procedure Sterile barriers: all five maximum sterile barriers used - cap, mask, sterile gown, sterile gloves, and large sterile sheet Hand hygiene: hand hygiene performed prior to central venous catheter insertion Location details: left femoral (Right Femoral tried and failed.) Patient position: Trendelenburg Catheter type: triple lumen Pre-procedure: landmarks identified Ultrasound guidance: yes Number of attempts: 5 or more Successful placement: yes Post-procedure: line sutured and dressing applied Assessment: blood return through all ports Patient tolerance: Patient tolerated the procedure well with no immediate  complications  .Intubation Date/Time: 06/16/2016 8:24 PM Performed by: Mancel Bale Authorized by: Mancel Bale Consent: The procedure was performed in an emergent situation. Verbal consent not obtained. Required items: required blood products, implants, devices, and special equipment available Patient identity confirmed: arm band, provided demographic data and hospital-assigned identification number Time out: Immediately prior to procedure a "time out" was called to verify the correct patient, procedure, equipment, support staff and site/side marked as required. Indications: respiratory failure Intubation method: fiberoptic oral Patient status: sedated Preoxygenation: nonrebreather mask Pretreatment medications: lidocaine Sedatives: etomidate Paralytic: succinylcholine Laryngoscope size: 3. Tube size: 7.5 mm Cricoid pressure: no Cords visualized: yes Post-procedure assessment: ETCO2 monitor and chest rise Breath sounds: equal Cuff inflated: yes ETT to lip: 22 cm Tube secured with: ETT holder Chest x-ray interpreted by me. Patient tolerance: Patient tolerated the procedure well with no immediate complications   (including critical care time)  17:13- on arrival, patient is noncommunicative. He has left eye fixed gaze, with left arm flexed at the elbow with clonic activity. No facial asymmetry. Does not respond to verbal or painful stimuli. Ativan ordered for seizure, and consultation with neural hospitalist. I discussed the case with the neurologist, who agrees with stat EEG. EEG ordered and I have asked the charge nurse to help get it done quickly. Meantime, we'll initiate standard orders for sepsis, with high-dose fluids and empiric antibiotics. Hypothermia presents CBG greater than 80.   Medications  norepinephrine (LEVOPHED) 4 mg in dextrose 5 % 250 mL (0.016 mg/mL) infusion (10 mcg/min Intravenous Rate/Dose Change 06/16/16 2042)  vancomycin (VANCOCIN) IVPB 750 mg/150 ml  premix (not administered)  piperacillin-tazobactam (ZOSYN) IVPB 3.375 g (not administered)  aspirin tablet 325 mg (325 mg Per Tube Given 06/16/16 2137)  atorvastatin (LIPITOR) tablet 20 mg (not administered)  folic acid injection 1 mg (1 mg Intravenous Given 06/16/16 2136)  thiamine (B-1) injection 100 mg (100 mg Intravenous Given 06/16/16 2134)  levETIRAcetam (KEPPRA) 1,000 mg in sodium chloride 0.9 % 100 mL IVPB (0 mg Intravenous Stopped 06/16/16 2159)  Valproate Sodium (DEPAKENE) solution 250 mg (250 mg Per Tube Given 06/16/16 2137)  hydrocortisone sodium succinate (SOLU-CORTEF) 100 MG injection 50 mg (50 mg Intravenous Given 06/16/16 2130)  0.9 %  sodium chloride infusion (not administered)  heparin injection 5,000 Units (not administered)  dextrose 5 %-0.45 % sodium chloride infusion ( Intravenous New Bag/Given 06/16/16 2305)  insulin aspart (novoLOG) injection 2-6 Units (not administered)  sodium chloride 0.9 % bolus 1,000 mL (0 mLs Intravenous Stopped 06/16/16 1939)    And  sodium chloride 0.9 % bolus 500 mL (0 mLs Intravenous Stopped 06/16/16 1939)    And  sodium chloride 0.9 % bolus 250 mL (0 mLs Intravenous Stopped 06/16/16 1939)  vancomycin (VANCOCIN) IVPB 1000 mg/200 mL premix (0 mg Intravenous Stopped 06/16/16 2030)  piperacillin-tazobactam (ZOSYN) IVPB 3.375 g (0 g Intravenous Stopped 06/16/16 1939)  LORazepam (ATIVAN) injection 1 mg (1 mg Intravenous Given 06/16/16 1743)  LORazepam (ATIVAN) injection 2 mg (2 mg Intravenous Given 06/16/16 2031)  fentaNYL (SUBLIMAZE) injection 100 mcg (100 mcg Intravenous Given 06/16/16 2034)    Patient Vitals for the past 24 hrs:  BP Temp Temp src Pulse Resp SpO2 Height Weight  06/17/16 0041 104/80 mmHg 97.9 F (36.6 C) Core 108 14 100 % - -  06/17/16 0026 - - - - - 100 % - -  06/17/16 0015 109/80 mmHg 97.2 F (36.2 C) - 101 14 100 % - -  06/17/16 0000 103/80 mmHg (!) 96.8 F (36 C) Core 97 14 100 % - -  06/16/16 2330 95/72 mmHg (!) 95.9 F (35.5 C)  Core 89 14 100 % - -  06/16/16 2315 94/71 mmHg (!) 95.5 F (35.3 C) Core 85 14 99 % - -  06/16/16 2306 92/69 mmHg (!) 95.4 F (35.2 C) Core 92 14 100 % - -  06/16/16 2250 97/75 mmHg - - 93 14 100 % - -  06/16/16 2215 107/83 mmHg (!) 94.1 F (34.5 C) - 77 14 100 % - -  06/16/16 2200 112/83 mmHg (!) 93.7 F (34.3 C) Core 77 14 100 % - -  06/16/16 2145 113/80 mmHg (!) 93.6 F (34.2 C) - 70 14 100 % - -  06/16/16 2130 108/83 mmHg (!) 93.2 F (34 C) - 68 14 100 % - -  06/16/16 2115 110/83 mmHg (!) 92.8 F (33.8 C) Core 64 14 100 % - -  06/16/16 2100 111/88 mmHg (!) 92.5 F (33.6 C) - 67 14 100 % - -  06/16/16 2050 106/84 mmHg (!) 92.3 F (33.5 C) - 65 14 100 % - -  06/16/16 2045 92/75 mmHg (!) 92.1 F (33.4 C) - 65 14 100 % - -  06/16/16 2040 (!) 79/63 mmHg (!) 92.1 F (33.4 C) - 69 14 100 % - -  06/16/16 2030 95/75 mmHg (!) 91.8 F (33.2 C) Core 74 14 100 % - -  06/16/16 2020 (!) 85/70 mmHg (!) 91.6 F (33.1 C) - 82 14 100 % - -  06/16/16 2015 90/74 mmHg (!) 91.4 F (33 C) Core 82 (!) 6 100 % - -  06/16/16 2007 - - - - - 100 % 6' (1.829 m) -  06/16/16 2000 (!) 81/59 mmHg (!) 91.2 F (32.9 C) Core 75 15 100 % - -  06/16/16 1950 (!) 69/55 mmHg (!) 91 F (32.8 C) Core 77 17 98 % - -  06/16/16 1945 (!) 70/54 mmHg (!) 90.9 F (32.7 C) Core 78 16 98 % - -  06/16/16 1940 (!) 70/54 mmHg (!) 90.9 F (32.7 C) - 76 17 97 % - -  06/16/16 1938 90/58 mmHg - - 77 17 98 % - -  06/16/16 1930 90/58 mmHg (!) 90.9 F (32.7 C) - 79 17 100 % - -  06/16/16 1920 (!) 71/51 mmHg (!) 90.3 F (32.4 C) - 76 14 100 % - -  06/16/16 1910 (!) 82/53 mmHg - - 78 17 100 % - -  06/16/16 1900 (!) 77/57 mmHg - - 80 19 100 % - 121 lb 4.1 oz (55 kg)  06/16/16 1850 (!) 63/45 mmHg - - 71 15 97 % - -  06/16/16 1840 (!) 62/44  mmHg - - 70 18 100 % - -  06/16/16 1830 (!) 62/46 mmHg - - 71 15 100 % - -  06/16/16 1820 - - - 72 14 99 % - -  06/16/16 1810 (!) 69/50 mmHg - - 75 17 95 % - -  06/16/16 1800 (!) 72/54 mmHg -  - 76 18 93 % - -  06/16/16 1750 (!) 72/57 mmHg - - 74 17 96 % - -  06/16/16 1740 90/69 mmHg - - 72 20 100 % - -  06/16/16 1730 94/79 mmHg - - 69 21 96 % - -  06/16/16 1710 93/75 mmHg - - 63 - 95 % - -  06/16/16 1651 (!) 80/58 mmHg - - (!) 56 18 (!) 88 % - -      19:15- case discussed with intensivist, who would like to have patient evaluated with arterial blood gas, prior to considering admission.  19:21-leave that started for hypotension, mild left groin femoral line. At this time, the patient's eyes are midline and there is no lip smacking, or left arm, clonus. He continues to be hypotensive. Pupils are midline.  19:40- discussed again with intensivist, who requested patient be intubated because of elevated PCO2 from baseline.  20:27- patient bitting tube, medications ordered.     CRITICAL CARE Performed by: Flint Melter Total critical care time: 130 minutes Critical care time was exclusive of separately billable procedures and treating other patients. Critical care was necessary to treat or prevent imminent or life-threatening deterioration. Critical care was time spent personally by me on the following activities: development of treatment plan with patient and/or surrogate as well as nursing, discussions with consultants, evaluation of patient's response to treatment, examination of patient, obtaining history from patient or surrogate, ordering and performing treatments and interventions, ordering and review of laboratory studies, ordering and review of radiographic studies, pulse oximetry and re-evaluation of patient's condition.     Labs Review Labs Reviewed  COMPREHENSIVE METABOLIC PANEL - Abnormal; Notable for the following:    Chloride 112 (*)    Calcium 8.6 (*)    Total Protein 5.2 (*)    Albumin 2.6 (*)    All other components within normal limits  CBC WITH DIFFERENTIAL/PLATELET - Abnormal; Notable for the following:    RBC 3.76 (*)    Hemoglobin 10.9 (*)    HCT  33.2 (*)    RDW 16.2 (*)    All other components within normal limits  VALPROIC ACID LEVEL - Abnormal; Notable for the following:    Valproic Acid Lvl 15 (*)    All other components within normal limits  BLOOD GAS, ARTERIAL - Abnormal; Notable for the following:    pH, Arterial 7.482 (*)    pCO2 arterial 24.7 (*)    pO2, Arterial 199 (*)    Bicarbonate 18.7 (*)    Acid-base deficit 4.0 (*)    All other components within normal limits  CULTURE, BLOOD (ROUTINE X 2)  CULTURE, BLOOD (ROUTINE X 2)  URINE CULTURE  CULTURE, BLOOD (ROUTINE X 2)  CULTURE, BLOOD (ROUTINE X 2)  URINE CULTURE  CULTURE, RESPIRATORY (NON-EXPECTORATED)  URINALYSIS, ROUTINE W REFLEX MICROSCOPIC (NOT AT [facility])  BLOOD GAS, ARTERIAL  AMMONIA  URINE RAPID DRUG SCREEN, HOSP PERFORMED  TROPONIN I  PROCALCITONIN  CREATININE, SERUM  CORTISOL  CALCIUM, IONIZED  TROPONIN I  PROCALCITONIN  PROTIME-INR  MAGNESIUM  PHOSPHORUS  URINALYSIS, ROUTINE W REFLEX MICROSCOPIC (NOT AT [facility])  BASIC METABOLIC PANEL  CBC  TROPONIN  I  I-STAT CG4 LACTIC ACID, ED  CBG MONITORING, ED  I-STAT CG4 LACTIC ACID, ED    Imaging Review Dg Abd 1 View  06/16/2016  CLINICAL DATA:  Orogastric tube placement. EXAM: ABDOMEN - 1 VIEW COMPARISON:  05/25/2016 FINDINGS: An orogastric tube is now seen with tip overlying the distal stomach in the expected region of the pylorus. A left femoral central venous catheter is seen in place as well as a temperature probe within the bladder. The bowel gas pattern is normal. IMPRESSION: Orogastric tube tip overlies the distal stomach, in expected region of pylorus. Electronically Signed   By: Myles Rosenthal M.D.   On: 06/16/2016 20:50   Dg Chest Port 1 View  06/16/2016  CLINICAL DATA:  Endotracheal tube placement EXAM: PORTABLE CHEST 1 VIEW COMPARISON:  06/16/2016 FINDINGS: There is an endotracheal tube with the tip 3.7 cm above the carina. Nasogastric tube coursing below the diaphragm. There is hazy left lower  lobe airspace disease. There is no pleural effusion or pneumothorax. The heart and mediastinal contours are unremarkable. The osseous structures are unremarkable. IMPRESSION: 1. Hazy left lower lobe airspace disease concerning for pneumonia. 2. Endotracheal tube with the tip 3.7 cm above the carina. Electronically Signed   By: Elige Ko   On: 06/16/2016 22:41   Dg Chest Port 1 View  06/16/2016  CLINICAL DATA:  Intubation.  Followup pneumonia. EXAM: PORTABLE CHEST 1 VIEW 8:24 p.m.: COMPARISON:  Portable chest x-ray earlier same date 5:49 p.m. and previously. FINDINGS: Endotracheal tube tip in satisfactory position projecting approximately 6 cm above the carina. Nasogastric tube courses below the diaphragm into the stomach though its tip is not included on the image. Consolidation in the left lower lobe as noted earlier. Developing patchy opacities at the right lung base. IMPRESSION: 1. Support apparatus satisfactory. Endotracheal tube tip approximately 6 cm above the carina. Nasogastric tube courses below the diaphragm into the stomach. 2. Left lower lobe pneumonia as noted earlier. Developing atelectasis and/or pneumonia involving the right lung base. Electronically Signed   By: Hulan Saas M.D.   On: 06/16/2016 20:49   Dg Chest Port 1 View  06/16/2016  CLINICAL DATA:  50 year old with possible aspiration and sepsis. EXAM: PORTABLE CHEST 1 VIEW COMPARISON:  05/29/2016, 05/27/2016 and earlier. FINDINGS: Patient is rotated to the left. Cardiomediastinal silhouette unremarkable, unchanged. Airspace consolidation in the retrocardiac left lung base. Lungs otherwise clear. Pulmonary vascularity normal. No visible pleural effusions. IMPRESSION: Left lower lobe pneumonia. Electronically Signed   By: Hulan Saas M.D.   On: 06/16/2016 18:22   I have personally reviewed and evaluated these images and lab results as part of my medical decision-making.   EKG Interpretation None      MDM   Final  diagnoses:  Altered mental status, unspecified altered mental status type  Hypothermia, initial encounter  Acute respiratory failure with hypercapnia (HCC)  Hypotension, unspecified hypotension type    Altered mental status, with apparent seizure activity on arrival. Seizure activity, improved somewhat with Ativan, but mental status did not improve. Significant hypothermia, without clear evidence for source of infection. Differential includes pneumonia, meningitis, urinary tract infection. Patient unstable. In emergency department requiring resuscitative efforts, with IV fluids, central venous access and intubation for hypercapnic respiratory failure. Patient was covered for sepsis of unknown origin, with initial markers not indicating any acute infectious process. Patient directly admitted to ICU at [facility] hospital, where he can get continuous EEG monitoring, and care by the critical care team.  Nursing Notes Reviewed/ Care Coordinated, and agree without changes. Applicable Imaging Reviewed.  Interpretation of Laboratory Data incorporated into ED treatment  Plan:- Transfers/admit    Mancel Bale, MD 06/17/16 [phone]

## 2016-06-16 NOTE — ED Notes (Signed)
[facility] notified for need of transport to [place] 

## 2016-06-16 NOTE — ED Notes (Signed)
[facility] Charge RN made aware of Pt needing to be transferred STAT.

## 2016-06-16 NOTE — ED Notes (Signed)
Pt attempting to sit up in bed. Dr.Wentz notified and orders for sedation given.

## 2016-06-16 NOTE — ED Notes (Signed)
Respiratory Therapy at bedside to advance ET Tube.

## 2016-06-16 NOTE — ED Notes (Signed)
Central Line inserted along with other IV access prior to 1900 on 06/16/16

## 2016-06-16 NOTE — H&P (Signed)
PULMONARY / CRITICAL CARE MEDICINE   Name: Dale [name] MRN: [id number] DOB: 06/04/1966    ADMISSION DATE:  06/16/2016 CONSULTATION DATE:  06/16/2016  REFERRING MD:  EDP Dr Effie Shy  CHIEF COMPLAINT:  AMS  HISTORY OF PRESENT ILLNESS:   50 year old male with complex medical history as outlined below. He presented to [facility] ED from [facility] 7/13 with complaints of AMS. His recent medical course over the past few months has been complicated as noted in discharge summary from Dr. Malachi Bonds 06/01/16:  3/14-4/6: Admitted with strep pneumo meningitis, c/b cardiac arrest, and cardiogenic shock (EF 20%, recovered to 50%) briefly on amiodarone, ARDS requiring prolonged intubation and trach (now removed), ARF, shock liver, and adrenal insufficiency. He was discharged on 4/6 with weekly Pen G IM until 4/10.  4/16-5/1: Readmitted with fall, altered mental status. Repeat LP had no growth, TEE without vegetation, ultimately transitioned to ceftriaxone 2g BID with plans for >4 weeks IV antibiotics. During this hospitalization, he was planning for SNF placement until the day of discharge when the patient's brother asked to take him home (it appears that this wasn't verified with the patient's estranged but legally married wife).  Since discharge, patient has returned to the ED 4 times because of loss of PICC line, PICC requiring to be flushed, PICC coming out, or patient wandering unattended in the street (he has dementia since his meningitis).  5/24-5/26: Readmitted for placement to SNF, given additional week of rocephin and was supposed to follow up with ID before stopping rocephin.  6/15-6/28: admit for occult sepsis and seizures, no source identified, postulated to be aspiration PNA related. He was treated with broad spectrum ABX, cultures were negative and he was returned to SNF. He never followed up with ID for meningitis ABX recommendations. He was seen by them inpatient who recommended they be stopped.    7/13 again presenting with AMS thought to be septic/PNA in origin but had witnessed seizure in ED. He was found to be hypotensive and hypothermic, however WBC and Lactic were wnl. CXR with LLL opacification He required CVL placement and vasopressors for shock despite aggressive IVF resuscitation.  He will need ICU admission for shock treatment and continuous EEG. He was intubated in the ED for airway protection and transferred to [facility]. At tiime of pccm MD eval - intubated, has femoral line and on levophed    CULTURES: Blood 07/13 > Urine 07/13 > Tracheal Aspirate 07/13 >  ANTIBIOTICS: Vanc 07/13 > Zosyn 07/13 >  SIGNIFICANT EVENTS: 07/13 > admitted with septic shock due to HCAP and seizures. CXR 07/13 > LLL PNA. cEEG 07/13 > 0rdered - to start after [facility] [place] [facility]   LINES/TUBES: ETT 07/13 > CVL 07/13 >    PAST MEDICAL HISTORY :  He  has a past medical history of Depression; Cardiac arrest (HCC); Cardiogenic shock (HCC); Acute respiratory failure with hypoxia (HCC); Meningitis, streptococcal (02/16/2016); Trichomonal urethritis in male (02/16/2016); Tobacco abuse (02/16/2016); Substance abuse; ETOH abuse; Septic shock (HCC); Urinary retention (05/04/2016); Systolic and diastolic CHF, chronic (HCC); Streptococcal meningitis (04/27/2016); Severe sepsis (HCC); Respiratory failure (HCC) (03/03/2016); Protein-calorie malnutrition, severe (HCC); NSTEMI (non-ST elevated myocardial infarction) (HCC) (03/14/2016); Meningoencephalitis; Meningitis, pneumococcal, recurrent; Low serum cortisol level (HCC) (05/04/2016); Hypothermia (03/20/2016); Hypernatremia; Hyperlipidemia (05/04/2016); Hydrocephalus; Dysphagia (05/04/2016); Dementia (04/27/2016); Convulsions/seizures (HCC) (05/04/2016); CHF (congestive heart failure) (HCC); Cerebral thrombosis with cerebral infarction (03/25/2016); Cerebral septic emboli (HCC); Altered mental status; AKI (acute kidney injury) (HCC) (02/16/2016); Acute renal  failure (  HCC); Acute ischemic stroke (HCC); and Acute encephalopathy.  PAST SURGICAL HISTORY: He  has past surgical history that includes Ankle surgery; Cardiac catheterization (N/A, 02/17/2016); and TEE without cardioversion (N/A, 03/29/2016).  No Known Allergies  No current facility-administered medications on file prior to encounter.   Current Outpatient Prescriptions on File Prior to Encounter  Medication Sig  . albuterol (PROVENTIL) (2.5 MG/3ML) 0.083% nebulizer solution Take 3 mLs (2.5 mg total) by nebulization every 3 (three) hours as needed for wheezing. (Patient taking differently: Take 2.5 mg by nebulization every 3 (three) hours as needed for wheezing or shortness of breath. )  . aspirin 325 MG tablet Take 1 tablet (325 mg total) by mouth daily.  Marland Kitchen. atorvastatin (LIPITOR) 20 MG tablet Take 1 tablet (20 mg total) by mouth daily at 6 PM.  . famotidine (PEPCID) 20 MG tablet Take 1 tablet (20 mg total) by mouth daily.  . feeding supplement, ENSURE ENLIVE, (ENSURE ENLIVE) LIQD Take 237 mLs by mouth every 6 (six) hours.  . folic acid (FOLVITE) 1 MG tablet Take 1 tablet (1 mg total) by mouth daily.  Marland Kitchen. levETIRAcetam (KEPPRA) 100 MG/ML solution Take 10 mLs (1,000 mg total) by mouth 2 (two) times daily.  Marland Kitchen. LORazepam (ATIVAN) 0.5 MG tablet Take 0.5 mg by mouth every 8 (eight) hours as needed for anxiety.   . Maltodextrin-Xanthan Gum (RESOURCE THICKENUP CLEAR) POWD As needed.  . tamsulosin (FLOMAX) 0.4 MG CAPS capsule Take 1 capsule (0.4 mg total) by mouth daily.  Marland Kitchen. thiamine 100 MG tablet Take 1 tablet (100 mg total) by mouth daily.  Marland Kitchen. valproic acid (DEPAKENE) 250 MG/5ML syrup Take 5 mLs (250 mg total) by mouth every 8 (eight) hours.  Marland Kitchen. amoxicillin-clavulanate (AUGMENTIN) 875-125 MG tablet Take 1 tablet by mouth 2 (two) times daily. (Patient not taking: Reported on 06/16/2016)    FAMILY HISTORY:  His has no family status information on file.   SOCIAL HISTORY: He  reports that he has been  smoking Cigarettes.  He has been smoking about 0.10 packs per day. He does not have any smokeless tobacco history on file. He reports that he uses illicit drugs (Marijuana). He reports that he does not drink alcohol.  REVIEW OF SYSTEMS:     SUBJECTIVE:    VITAL SIGNS: BP 93/75 mmHg  Pulse 63  Temp(Src)   Resp 18  Wt 55 kg (121 lb 4.1 oz)  SpO2 95%  HEMODYNAMICS:    VENTILATOR SETTINGS:    INTAKE / OUTPUT:    PHYSICAL EXAMINATION: General:  Critically and chronically ill looking male.  On ventilator in ER bed Neuro:  RASS -4 equivalent . HEENT:  Intubated. ET tube high on cxr Cardiovascular:  Normal heart sounds Lungs:  Synch with vent and clear Abdomen:  soft Musculoskeletal:  No cyanosis. No clubbing Skin:  Unclear if decub posteriorly  LABS: PULMONARY  Recent Labs Lab 06/16/16 1922  PHART 7.378  PCO2ART 38.4  PO2ART 82.7  HCO3 23.5  TCO2 22.8  O2SAT 97.0    CBC  Recent Labs Lab 06/16/16 1758  HGB 10.9*  HCT 33.2*  WBC 6.1  PLT 224    COAGULATION No results for input(s): INR in the last 168 hours.  CARDIAC  No results for input(s): TROPONINI in the last 168 hours. No results for input(s): PROBNP in the last 168 hours.   CHEMISTRY  Recent Labs Lab 06/16/16 1758  NA 144  K 4.9  CL 112*  CO2 26  GLUCOSE 80  BUN  14  CREATININE 0.76  CALCIUM 8.6*   Estimated Creatinine Clearance: 85 mL/min (by C-G formula based on Cr of 0.76).   LIVER  Recent Labs Lab 06/16/16 1758  AST 23  ALT 23  ALKPHOS 59  BILITOT 0.6  PROT 5.2*  ALBUMIN 2.6*     INFECTIOUS  Recent Labs Lab 06/16/16 1806 06/16/16 2110  LATICACIDVEN 0.73 0.97     ENDOCRINE CBG (last 3)   Recent Labs  06/16/16 1708  GLUCAP 85         IMAGING x48h  - image(s) personally visualized  -   highlighted in bold Dg Abd 1 View  06/16/2016  CLINICAL DATA:  Orogastric tube placement. EXAM: ABDOMEN - 1 VIEW COMPARISON:  05/25/2016 FINDINGS: An orogastric  tube is now seen with tip overlying the distal stomach in the expected region of the pylorus. A left femoral central venous catheter is seen in place as well as a temperature probe within the bladder. The bowel gas pattern is normal. IMPRESSION: Orogastric tube tip overlies the distal stomach, in expected region of pylorus. Electronically Signed   By: Myles Rosenthal M.D.   On: 06/16/2016 20:50   Dg Chest Port 1 View  06/16/2016  CLINICAL DATA:  Intubation.  Followup pneumonia. EXAM: PORTABLE CHEST 1 VIEW 8:24 p.m.: COMPARISON:  Portable chest x-ray earlier same date 5:49 p.m. and previously. FINDINGS: Endotracheal tube tip in satisfactory position projecting approximately 6 cm above the carina. Nasogastric tube courses below the diaphragm into the stomach though its tip is not included on the image. Consolidation in the left lower lobe as noted earlier. Developing patchy opacities at the right lung base. IMPRESSION: 1. Support apparatus satisfactory. Endotracheal tube tip approximately 6 cm above the carina. Nasogastric tube courses below the diaphragm into the stomach. 2. Left lower lobe pneumonia as noted earlier. Developing atelectasis and/or pneumonia involving the right lung base. Electronically Signed   By: Hulan Saas M.D.   On: 06/16/2016 20:49   Dg Chest Port 1 View  06/16/2016  CLINICAL DATA:  50 year old with possible aspiration and sepsis. EXAM: PORTABLE CHEST 1 VIEW COMPARISON:  05/29/2016, 05/27/2016 and earlier. FINDINGS: Patient is rotated to the left. Cardiomediastinal silhouette unremarkable, unchanged. Airspace consolidation in the retrocardiac left lung base. Lungs otherwise clear. Pulmonary vascularity normal. No visible pleural effusions. IMPRESSION: Left lower lobe pneumonia. Electronically Signed   By: Hulan Saas M.D.   On: 06/16/2016 18:22      DISCUSSION: 50 y.o. M with complex PMH including recent admissions for strep pneumo meningitis c/b cardiac arrest and cardiogenic  shock (March 2017).  Had trach at the time but has since been decannulated.  Now resides at [facility] and was admitted again 07/13 with septic shock shock due to PNA as well as seizures.  He required intubation in ED for airway protection.  ASSESSMENT / PLAN:  PULMONARY A: Ventilator dependence due to inability to protect the airway in the setting of seizures. HCAP. Hx tracheostomy in March 2017, now decannulated. P:   Continue full vent support. Assess ABG. Wean as able. Abx / cultures per ID section. Pulmonary hygiene. CXR in AM.  CARDIOVASCULAR A:  Septic shock - due to HCAP. Hx cardiac arrest, cardiogenic shock, NSTEMI, HLD, CHF (EF 45 - 50% per echo from June 2017). P:  Continue levophed as needed for goal MAP > 65. Assess cortisol. Stress steroids, d/c if cortisol normal. Assess troponin, repeat lactate. Continue outpatient ASA, atorvastatin.  RENAL A:   Pseudohypocalcemia - corrects  to 9.72. P:   NS @ 75. Assess ionized calcium. BMP in AM.  GASTROINTESTINAL A:   GI prophylaxis. Nutrition. P:   SUP: Pantoprazole. NPO. Start TF's if not extubated in AM.  HEMATOLOGIC A:   Mild anemia - chronic. VTE prophylaxis. P:  Transfuse for Hgb < 7. SCD's / Heparin. CBC in AM.  INFECTIOUS A:   Septic shock - due to HCAP. Hx strep pneumo meningitis (march 2017). P:   Abx as above (vanc / zosyn). Follow cultures.  ENDOCRINE A:   At risk hyperglycemia due to stress steroids.   P:   SSI.  NEUROLOGIC A:   Acute encephalopathy. Seizures. Hx strep pneumo meningitis, hydrocephalus, CVA. P:   Sedation: Fentanyl prn  / Midazolam PRN. RASS goal: 0 to -1. Daily WUA. Neuro consult. - Dr Roseanne Reno night on call aware - will see upon arrival at [facility] Continuous EEG. Continue outpatient keppra, valproic acid, thiamine, folate.   FAMILY  - Updates: none at bedside  - Inter-disciplinary family meet or Palliative Care meeting due by:  06/23/16   The  patient is critically ill with multiple organ systems failure and requires high complexity decision making for assessment and support, frequent evaluation and titration of therapies, application of advanced monitoring technologies and extensive interpretation of multiple databases.   Critical Care Time devoted to patient care services described in this note is  30  Minutes. This time reflects time of care of this signee Dr Kalman Shan. This critical care time does not reflect procedure time, or teaching time or supervisory time of PA/NP/Med student/Med Resident etc but could involve care discussion time    Dr. Kalman Shan, M.D., [facility].C.P Pulmonary and Critical Care Medicine Staff Physician [facility] Pager: [phone], If no answer or between  15:00h - 7:00h: call [phone]  06/16/2016 10:11 PM

## 2016-06-16 NOTE — ED Notes (Signed)
MD reports increased tone on L side of body and gaze to the L. Valproic acid level noted to be 23 on 06/08/16.

## 2016-06-16 NOTE — ED Notes (Signed)
1955 Dr.Wentz at bedside along with respiratory therapy to prepare for intubation.  1958 Lidocane 100mg  administered IVP while Dr.Wentz administering breaths by BVM at 15L 2000 Etomidate 20mg  administered IVP through central line in left femoral. 2001 Succinylcholine 125mg  administered IVP through central lin in left femoral.  2002 Intubation done by Dr.Wentz with Glidoscope 7.5ETT inserted and 24 at the lip secured by Respiratory therapy after auscultation.  2008 OG Tube inserted and secured to left side of lip after positive conformation of stomach contents. Awaiting CXR for conformation of OG tube and Intubation placement.

## 2016-06-16 NOTE — ED Notes (Signed)
Per [facility], Pt, [facility], presents w/ possible aspiration and possible sepsis.  Facility reports Pt began "acting different" today.  Pt found to have rectal temp 89.0.  Hx of aspiration PNA and sepsis.  Per facility paperwork, Pt has been on IV antibiotics for PNA w/o improvement.  Also, chest x-ray from yesterday resulted PNA.

## 2016-06-16 NOTE — ED Notes (Signed)
Dr. Effie ShyWentz at bedside to assess pt. Advised pt to be Intubated before [facility]. Levophed increased to 213mcg/min due to BP 70/54.

## 2016-06-16 NOTE — ED Notes (Signed)
Report attempted was told nurse would call back for report.  

## 2016-06-16 NOTE — ED Notes (Signed)
[facility] advised that truck coming for transport would be arriving in appx 1 hour.

## 2016-06-16 NOTE — Progress Notes (Signed)
RT advanced ET tube 3 cm per order from 24 @ the lip to 27@ the lip.

## 2016-06-16 NOTE — ED Notes (Signed)
Pt still remains sedated at this time with no continious sedation. Equal chest rise and fall present while on ventilator. Currently awaiting room assignment at [facility].

## 2016-06-17 ENCOUNTER — Inpatient Hospital Stay (HOSPITAL_COMMUNITY): Payer: Medicaid Other

## 2016-06-17 ENCOUNTER — Encounter (HOSPITAL_COMMUNITY): Payer: Medicaid Other

## 2016-06-17 DIAGNOSIS — J9602 Acute respiratory failure with hypercapnia: Secondary | ICD-10-CM | POA: Insufficient documentation

## 2016-06-17 DIAGNOSIS — G9340 Encephalopathy, unspecified: Secondary | ICD-10-CM

## 2016-06-17 DIAGNOSIS — Z452 Encounter for adjustment and management of vascular access device: Secondary | ICD-10-CM | POA: Insufficient documentation

## 2016-06-17 LAB — CBC
HEMATOCRIT: 28.8 % — AB (ref 39.0–52.0)
HEMOGLOBIN: 9.7 g/dL — AB (ref 13.0–17.0)
MCH: 29 pg (ref 26.0–34.0)
MCHC: 33.7 g/dL (ref 30.0–36.0)
MCV: 86 fL (ref 78.0–100.0)
Platelets: 239 10*3/uL (ref 150–400)
RBC: 3.35 MIL/uL — ABNORMAL LOW (ref 4.22–5.81)
RDW: 16 % — AB (ref 11.5–15.5)
WBC: 10.9 10*3/uL — AB (ref 4.0–10.5)

## 2016-06-17 LAB — PROCALCITONIN: Procalcitonin: 0.23 ng/mL

## 2016-06-17 LAB — BASIC METABOLIC PANEL
ANION GAP: 4 — AB (ref 5–15)
BUN: 10 mg/dL (ref 6–20)
CHLORIDE: 117 mmol/L — AB (ref 101–111)
CO2: 20 mmol/L — ABNORMAL LOW (ref 22–32)
Calcium: 7.9 mg/dL — ABNORMAL LOW (ref 8.9–10.3)
Creatinine, Ser: 1.02 mg/dL (ref 0.61–1.24)
GFR calc Af Amer: >60 mL/min (ref >60)
Glucose, Bld: 124 mg/dL — ABNORMAL HIGH (ref 65–99)
POTASSIUM: 4 mmol/L (ref 3.5–5.1)
SODIUM: 141 mmol/L (ref 135–145)

## 2016-06-17 LAB — PROTIME-INR
INR: 1.2 (ref 0.00–1.49)
Prothrombin Time: 15.3 seconds — ABNORMAL HIGH (ref 11.6–15.2)

## 2016-06-17 LAB — GLUCOSE, CAPILLARY
GLUCOSE-CAPILLARY: 130 mg/dL — AB (ref 65–99)
GLUCOSE-CAPILLARY: 160 mg/dL — AB (ref 65–99)
GLUCOSE-CAPILLARY: 87 mg/dL (ref 65–99)
Glucose-Capillary: 151 mg/dL — ABNORMAL HIGH (ref 65–99)
Glucose-Capillary: 84 mg/dL (ref 65–99)
Glucose-Capillary: 99 mg/dL (ref 65–99)

## 2016-06-17 LAB — TRIGLYCERIDES: TRIGLYCERIDES: 100 mg/dL (ref <150)

## 2016-06-17 LAB — TROPONIN I: Troponin I: <0.03 ng/mL (ref <0.03)

## 2016-06-17 LAB — MAGNESIUM: Magnesium: 1.3 mg/dL — ABNORMAL LOW (ref 1.7–2.4)

## 2016-06-17 LAB — MRSA PCR SCREENING: MRSA by PCR: NEGATIVE

## 2016-06-17 LAB — PHOSPHORUS: Phosphorus: 1.6 mg/dL — ABNORMAL LOW (ref 2.5–4.6)

## 2016-06-17 LAB — VALPROIC ACID LEVEL: VALPROIC ACID LVL: 96 ug/mL (ref 50.0–100.0)

## 2016-06-17 LAB — CORTISOL: Cortisol, Plasma: 4.3 ug/dL

## 2016-06-17 MED ORDER — SODIUM CHLORIDE 0.9 % IV SOLN: INTRAVENOUS | Status: DC

## 2016-06-17 MED ORDER — MIDAZOLAM HCL 2 MG/2ML IJ SOLN: 2.0000 mg | Freq: Once | INTRAMUSCULAR | Status: AC

## 2016-06-17 MED ORDER — VALPROATE SODIUM 500 MG/5ML IV SOLN
500.0000 mg | Freq: Three times a day (TID) | INTRAVENOUS | Status: DC
  Administered 2016-06-17 (×3): 500 mg via INTRAVENOUS
  Filled 2016-06-17 (×4): qty 5

## 2016-06-17 MED ORDER — ANTISEPTIC ORAL RINSE SOLUTION (CORINZ)
7.0000 mL | OROMUCOSAL | Status: DC
  Administered 2016-06-17 – 2016-06-24 (×76): 7 mL via OROMUCOSAL

## 2016-06-17 MED ORDER — VALPROATE SODIUM 500 MG/5ML IV SOLN
1000.0000 mg | Freq: Once | INTRAVENOUS | Status: AC
  Administered 2016-06-17: 1000 mg via INTRAVENOUS
  Filled 2016-06-17: qty 10

## 2016-06-17 MED ORDER — CHLORHEXIDINE GLUCONATE 0.12% ORAL RINSE (MEDLINE KIT)
15.0000 mL | Freq: Two times a day (BID) | OROMUCOSAL | Status: DC
  Administered 2016-06-17 – 2016-07-11 (×44): 15 mL via OROMUCOSAL
  Filled 2016-06-17 (×3): qty 15

## 2016-06-17 MED ORDER — PROPOFOL 1000 MG/100ML IV EMUL: 10.0000 ug/kg/min | INTRAVENOUS | Status: DC

## 2016-06-17 MED ORDER — LORAZEPAM 2 MG/ML IJ SOLN
2.0000 mg | INTRAMUSCULAR | Status: DC | PRN
  Administered 2016-06-17 – 2016-06-28 (×2): 2 mg via INTRAVENOUS
  Filled 2016-06-17 (×2): qty 1

## 2016-06-17 MED ORDER — LORAZEPAM 2 MG/ML IJ SOLN
INTRAMUSCULAR | Status: AC
  Filled 2016-06-17: qty 1

## 2016-06-17 MED ORDER — PROPOFOL 1000 MG/100ML IV EMUL
10.0000 ug/kg/min | INTRAVENOUS | Status: DC
  Administered 2016-06-17: 30 ug/kg/min via INTRAVENOUS
  Filled 2016-06-17: qty 100

## 2016-06-17 MED ORDER — MIDAZOLAM HCL 2 MG/2ML IJ SOLN
INTRAMUSCULAR | Status: AC
  Administered 2016-06-17: 2 mg
  Filled 2016-06-17: qty 2

## 2016-06-17 MED ORDER — DEXTROSE 5 % IV SOLN
0.0000 ug/min | INTRAVENOUS | Status: DC
  Administered 2016-06-17: 20 ug/min via INTRAVENOUS
  Administered 2016-06-18: 5 ug/min via INTRAVENOUS
  Filled 2016-06-17 (×2): qty 16

## 2016-06-17 MED ORDER — FAMOTIDINE IN NACL 20-0.9 MG/50ML-% IV SOLN
20.0000 mg | Freq: Two times a day (BID) | INTRAVENOUS | Status: DC
  Administered 2016-06-17 – 2016-06-18 (×3): 20 mg via INTRAVENOUS
  Filled 2016-06-17 (×4): qty 50

## 2016-06-17 NOTE — Progress Notes (Signed)
[facility] here to [facility].

## 2016-06-17 NOTE — ED Notes (Signed)
Report given to [facility] advised appx ETA.

## 2016-06-17 NOTE — Progress Notes (Signed)
Initial Nutrition Assessment  DOCUMENTATION CODES:   Severe malnutrition in context of chronic illness  INTERVENTION:   If pt remains intubated recommend initiating enteral nutrition therapy: Vital AF 1.2 @ 25 ml/hr and increase by 10 ml every 8 hours to goal rate of 55 ml/hr  Provides: 1320 ml, 1584 kcal, 99 grams protein, and 1070 ml free H2O.   Monitor magnesium and phosphorus every 12 hours, MD to replete as needed, as pt is at risk for refeeding syndrome given severe malnutrition.   NUTRITION DIAGNOSIS:   Malnutrition (Severe) related to chronic illness (meningitis/dementia) as evidenced by severe depletion of muscle mass, severe depletion of body fat.  GOAL:   Patient will meet greater than or equal to 90% of their needs  MONITOR:   Vent status, I & O's, Labs, Weight trends  REASON FOR ASSESSMENT:   Consult Assessment of nutrition requirement/status (TF recommendations)  ASSESSMENT:   Pt admitted from SNF pt with multiple admissions since March 2017 when he had strep pneumo meningitis, cardiac arrest prolonged intubation. Pt now readmitted with sepsis and seizure.    Pt discussed during ICU rounds and with RN. Unable to determine nutrition hx PTA.    Patient is currently intubated on ventilator support MV: 8.9 L/min Temp (24hrs), Avg:95 F (35 C), Min:90.3 F (32.4 C), Max:97.9 F (36.6 C)  Propofol: off Medications reviewed and include: folic acid, thiamine Labs reviewed: PO4: 1.6, Magnesium 1.3 CBG's: 87-160 Nutrition-Focused physical exam completed. Findings are mild/moderate - severe fat depletion, mild/moderate - severe muscle depletion, and no edema.    Diet Order:     None   Skin:  Reviewed, no issues  Last BM:  unknown  Height:   Ht Readings from Last 1 Encounters:  06/17/16 5\' 8"  (1.727 m)    Weight:   Wt Readings from Last 1 Encounters:  06/17/16 138 lb 14.2 oz (63 kg)  Admission weight 121 lb (55 kg)  Ideal Body Weight:  70  kg  BMI:  Body mass index is 21.12 kg/(m^2).  Estimated Nutritional Needs:   Kcal:  1503  Protein:  90-115 grams  Fluid:  > 1.5 L/day  EDUCATION NEEDS:   No education needs identified at this time  Kendell BaneHeather Annamarie Yamaguchi RD, LDN, CNSC [phone] Pager [phone] After Hours Pager

## 2016-06-17 NOTE — Progress Notes (Signed)
PULMONARY / CRITICAL CARE MEDICINE   Name: JORON VELIS MRN: [id number] DOB: March 29, 1966    ADMISSION DATE:  06/16/2016 CONSULTATION DATE:  06/16/2016  REFERRING MD:  EDP Dr Effie Shy  CHIEF COMPLAINT:  AMS  HISTORY OF PRESENT ILLNESS:   50 year old male with complex medical history as outlined below. He presented to [facility] ED from [facility] 7/13 with complaints of AMS. His recent medical course over the past few months has been complicated as noted in discharge summary from Dr. Malachi Bonds 06/01/16:  3/14-4/6: Admitted with strep pneumo meningitis, c/b cardiac arrest, and cardiogenic shock (EF 20%, recovered to 50%) briefly on amiodarone, ARDS requiring prolonged intubation and trach (now removed), ARF, shock liver, and adrenal insufficiency. He was discharged on 4/6 with weekly Pen G IM until 4/10.  4/16-5/1: Readmitted with fall, altered mental status. Repeat LP had no growth, TEE without vegetation, ultimately transitioned to ceftriaxone 2g BID with plans for >4 weeks IV antibiotics. During this hospitalization, he was planning for SNF placement until the day of discharge when the patient's brother asked to take him home (it appears that this wasn't verified with the patient's estranged but legally married wife).  Since discharge, patient has returned to the ED 4 times because of loss of PICC line, PICC requiring to be flushed, PICC coming out, or patient wandering unattended in the street (he has dementia since his meningitis).  5/24-5/26: Readmitted for placement to SNF, given additional week of rocephin and was supposed to follow up with ID before stopping rocephin.  6/15-6/28: admit for occult sepsis and seizures, no source identified, postulated to be aspiration PNA related. He was treated with broad spectrum ABX, cultures were negative and he was returned to SNF. He never followed up with ID for meningitis ABX recommendations. He was seen by them inpatient who recommended they be stopped.    7/13 again presenting with AMS thought to be septic/PNA in origin but had witnessed seizure in ED. He was found to be hypotensive and hypothermic, however WBC and Lactic were wnl. CXR with LLL opacification He required CVL placement and vasopressors for shock despite aggressive IVF resuscitation.  He will need ICU admission for shock treatment and continuous EEG. He was intubated in the ED for airway protection and transferred to [facility]. At tiime of pccm MD eval - intubated, has femoral line and on levophed   CULTURES: Blood 07/13 > Urine 07/13 > Tracheal Aspirate 07/13 >  ANTIBIOTICS: Vanc 07/13 > Zosyn 07/13 >  SIGNIFICANT EVENTS: 07/13 > admitted with septic shock due to HCAP and seizures. CXR 07/13 > LLL PNA. cEEG 07/13 > 0rdered - to start after transfer from  to [facility]   LINES/TUBES: ETT 07/13 > CVL 07/13 >    PAST MEDICAL HISTORY :  He  has a past medical history of Depression; Cardiac arrest (HCC); Cardiogenic shock (HCC); Acute respiratory failure with hypoxia (HCC); Meningitis, streptococcal (02/16/2016); Trichomonal urethritis in male (02/16/2016); Tobacco abuse (02/16/2016); Substance abuse; ETOH abuse; Septic shock (HCC); Urinary retention (05/04/2016); Systolic and diastolic CHF, chronic (HCC); Streptococcal meningitis (04/27/2016); Severe sepsis (HCC); Respiratory failure (HCC) (03/03/2016); Protein-calorie malnutrition, severe (HCC); NSTEMI (non-ST elevated myocardial infarction) (HCC) (03/14/2016); Meningoencephalitis; Meningitis, pneumococcal, recurrent; Low serum cortisol level (HCC) (05/04/2016); Hypothermia (03/20/2016); Hypernatremia; Hyperlipidemia (05/04/2016); Hydrocephalus; Dysphagia (05/04/2016); Dementia (04/27/2016); Convulsions/seizures (HCC) (05/04/2016); CHF (congestive heart failure) (HCC); Cerebral thrombosis with cerebral infarction (03/25/2016); Cerebral septic emboli (HCC); Altered mental status; AKI (acute kidney injury) (HCC) (02/16/2016); Acute renal  failure (HCC);  Acute ischemic stroke (HCC); and Acute encephalopathy.   SUBJECTIVE: Heavily sedated on exam 0800 7/14, on pressors  VITAL SIGNS: BP 110/85 mmHg  Pulse 91  Temp(Src) 97.3 F (36.3 C) (Core (Comment))  Resp 14  Ht 5\' 8"  (1.727 m)  Wt 138 lb 14.2 oz (63 kg)  BMI 21.12 kg/m2  SpO2 100%  HEMODYNAMICS: CVP:  [1 mmHg-8 mmHg] 8 mmHg  VENTILATOR SETTINGS: Vent Mode:  [-] PRVC FiO2 (%):  [40 %-100 %] 40 % Set Rate:  [14 bmp] 14 bmp Vt Set:  [620 mL] 620 mL PEEP:  [5 cmH20] 5 cmH20 Plateau Pressure:  [16 cmH20-18 cmH20] 16 cmH20  INTAKE / OUTPUT: I/O last 3 completed shifts: In: 1017.2 [I.V.:857.2; IV Piggyback:160] Out: 1030 [Urine:1030]  PHYSICAL EXAMINATION: General:  Critically and chronically ill looking male.  On ventilator , heavily sedated Neuro:  RASS -4 equivalent . Diprivan at 30. + gag,  HEENT:  Intubated. OGT, PERL pinpoint Cardiovascular:  Normal heart sounds Lungs:  Synch with vent and clear Abdomen:  soft Musculoskeletal:  No cyanosis. No clubbing Skin:  Unclear if decub posteriorly  LABS: PULMONARY  Recent Labs Lab 06/16/16 1922 06/16/16 2243  PHART 7.378 7.482*  PCO2ART 38.4 24.7*  PO2ART 82.7 199*  HCO3 23.5 18.7*  TCO2 22.8 17.3  O2SAT 97.0 99.5    CBC  Recent Labs Lab 06/16/16 1758 06/17/16 0459  HGB 10.9* 9.7*  HCT 33.2* 28.8*  WBC 6.1 10.9*  PLT 224 239    COAGULATION  Recent Labs Lab 06/17/16 0459  INR 1.20    CARDIAC    Recent Labs Lab 06/16/16 2243 06/17/16 0557  TROPONINI <0.03 <0.03   No results for input(s): PROBNP in the last 168 hours.   CHEMISTRY  Recent Labs Lab 06/16/16 1758 06/16/16 2243 06/17/16 0459  NA 144  --  141  K 4.9  --  4.0  CL 112*  --  117*  CO2 26  --  20*  GLUCOSE 80  --  124*  BUN 14  --  10  CREATININE 0.76 0.83 1.02  CALCIUM 8.6*  --  7.9*  MG  --   --  1.3*  PHOS  --   --  1.6*   Estimated Creatinine Clearance: 76.3 mL/min (by C-G formula based on Cr of  1.02).   LIVER  Recent Labs Lab 06/16/16 1758 06/17/16 0459  AST 23  --   ALT 23  --   ALKPHOS 59  --   BILITOT 0.6  --   PROT 5.2*  --   ALBUMIN 2.6*  --   INR  --  1.20     INFECTIOUS  Recent Labs Lab 06/16/16 1806 06/16/16 2110 06/16/16 2243 06/17/16 0459  LATICACIDVEN 0.73 0.97  --   --   PROCALCITON  --   --  0.11 0.23     ENDOCRINE CBG (last 3)   Recent Labs  06/16/16 1708 06/17/16 0337 06/17/16 0753  GLUCAP 85 87 160*     Dg Abd 1 View  06/16/2016  CLINICAL DATA:  Orogastric tube placement. EXAM: ABDOMEN - 1 VIEW COMPARISON:  05/25/2016 FINDINGS: An orogastric tube is now seen with tip overlying the distal stomach in the expected region of the pylorus. A left femoral central venous catheter is seen in place as well as a temperature probe within the bladder. The bowel gas pattern is normal. IMPRESSION: Orogastric tube tip overlies the distal stomach, in expected region of pylorus. Electronically Signed   By:  Myles Rosenthal M.D.   On: 06/16/2016 20:50   Dg Chest Port 1 View  06/17/2016  CLINICAL DATA:  50 year old male with enteric tube placement. EXAM: PORTABLE CHEST 1 VIEW COMPARISON:  A abdominal radiograph dated 06/16/2016 FINDINGS: An endotracheal tube is noted with tip approximately 3 cm above the carina. Single portable view of the chest demonstrates patchy area of hazy airspace opacity in the left mid to lower lung field. There is no pleural effusion or pneumothorax. The cardiac silhouette is within normal limits. Left IJ central line with tip over central SVC. An enteric tube extends into the epigastric area with tip superimposed over the right L3 pedicle likely in the distal stomach. There is no bowel dilatation or evidence of obstruction. No free air. No radiopaque calculi or foreign object. The soft tissues and osseous structures appear unremarkable. The previously seen left femoral central venous catheter has been removed. A temperature probe is  partially visualized over the bladder. IMPRESSION: Endotracheal tube above the carina. Patchy area of airspace density in the left mid to lower lung field. Follow-up recommended. Enteric tube in the distal stomach.  No bowel obstruction. Electronically Signed   By: Elgie Collard M.D.   On: 06/17/2016 02:48   Dg Chest Port 1 View  06/16/2016  CLINICAL DATA:  Endotracheal tube placement EXAM: PORTABLE CHEST 1 VIEW COMPARISON:  06/16/2016 FINDINGS: There is an endotracheal tube with the tip 3.7 cm above the carina. Nasogastric tube coursing below the diaphragm. There is hazy left lower lobe airspace disease. There is no pleural effusion or pneumothorax. The heart and mediastinal contours are unremarkable. The osseous structures are unremarkable. IMPRESSION: 1. Hazy left lower lobe airspace disease concerning for pneumonia. 2. Endotracheal tube with the tip 3.7 cm above the carina. Electronically Signed   By: Elige Ko   On: 06/16/2016 22:41   Dg Chest Port 1 View  06/16/2016  CLINICAL DATA:  Intubation.  Followup pneumonia. EXAM: PORTABLE CHEST 1 VIEW 8:24 p.m.: COMPARISON:  Portable chest x-ray earlier same date 5:49 p.m. and previously. FINDINGS: Endotracheal tube tip in satisfactory position projecting approximately 6 cm above the carina. Nasogastric tube courses below the diaphragm into the stomach though its tip is not included on the image. Consolidation in the left lower lobe as noted earlier. Developing patchy opacities at the right lung base. IMPRESSION: 1. Support apparatus satisfactory. Endotracheal tube tip approximately 6 cm above the carina. Nasogastric tube courses below the diaphragm into the stomach. 2. Left lower lobe pneumonia as noted earlier. Developing atelectasis and/or pneumonia involving the right lung base. Electronically Signed   By: Hulan Saas M.D.   On: 06/16/2016 20:49   Dg Chest Port 1 View  06/16/2016  CLINICAL DATA:  50 year old with possible aspiration and sepsis.  EXAM: PORTABLE CHEST 1 VIEW COMPARISON:  05/29/2016, 05/27/2016 and earlier. FINDINGS: Patient is rotated to the left. Cardiomediastinal silhouette unremarkable, unchanged. Airspace consolidation in the retrocardiac left lung base. Lungs otherwise clear. Pulmonary vascularity normal. No visible pleural effusions. IMPRESSION: Left lower lobe pneumonia. Electronically Signed   By: Hulan Saas M.D.   On: 06/16/2016 18:22   Dg Abd Portable 1v  06/17/2016  CLINICAL DATA:  50 year old male with enteric tube placement. EXAM: PORTABLE CHEST 1 VIEW COMPARISON:  A abdominal radiograph dated 06/16/2016 FINDINGS: An endotracheal tube is noted with tip approximately 3 cm above the carina. Single portable view of the chest demonstrates patchy area of hazy airspace opacity in the left mid to lower lung field.  There is no pleural effusion or pneumothorax. The cardiac silhouette is within normal limits. Left IJ central line with tip over central SVC. An enteric tube extends into the epigastric area with tip superimposed over the right L3 pedicle likely in the distal stomach. There is no bowel dilatation or evidence of obstruction. No free air. No radiopaque calculi or foreign object. The soft tissues and osseous structures appear unremarkable. The previously seen left femoral central venous catheter has been removed. A temperature probe is partially visualized over the bladder. IMPRESSION: Endotracheal tube above the carina. Patchy area of airspace density in the left mid to lower lung field. Follow-up recommended. Enteric tube in the distal stomach.  No bowel obstruction. Electronically Signed   By: Elgie CollardArash  Radparvar M.D.   On: 06/17/2016 02:48      DISCUSSION: 50 y.o. M with complex PMH including recent admissions for strep pneumo meningitis c/b cardiac arrest and cardiogenic shock (March 2017).  Had trach at the time but has since been decannulated.  [facility] and was admitted again 07/13 with septic shock  shock due to PNA as well as seizures.  He required intubation in ED for airway protection. 7/13 for CT head and EEG.  ASSESSMENT / PLAN:  PULMONARY A: Ventilator dependence due to inability to protect the airway in the setting of seizures. HCAP. Hx tracheostomy in March 2017, now decannulated. P:   Continue full vent support. Wean as able. But will require decrease in sedation.  Abx / cultures per ID section. Pulmonary hygiene. CXR as needed  CARDIOVASCULAR A:  Septic shock - due to HCAP. ++ oversedation in setting decreased EF. Consider also cardiogenic contribution Hx cardiac arrest, cardiogenic shock, NSTEMI, HLD, CHF (EF 45 - 50% per echo from June 2017). Cortisol 4.3 P:  Continue levophed as needed for goal MAP > 65. Stress steroids, continue given cortisol 4.3 Assess troponin, follow lactate. Continue outpatient ASA, atorvastatin. Wean propofol given hemodynamic effects.   RENAL A:   Pseudohypocalcemia - corrects to 9.0 7/14 Hypophosphoremia   P:   NS @ 75. BMP monitor  GASTROINTESTINAL A:   GI prophylaxis. Nutrition. P:   SUP: Pantoprazole. NPO. Start TF's if not extubated 7/14  HEMATOLOGIC A:   Mild anemia - chronic. VTE prophylaxis. P:  Transfuse for Hgb < 7. SCD's / Heparin. CBC in AM.  INFECTIOUS A:   Septic shock -  possible HCAP No clear evidence to support recurrent meningitis or endocarditis.  Hx strep pneumo meningitis (march 2017). P:   Abx as above (vanc / zosyn). Follow cultures. Consider repeat echo Depending on MS and progress could also consider repeat LP  ENDOCRINE CBG (last 3)   Recent Labs  06/16/16 1708 06/17/16 0337 06/17/16 0753  GLUCAP 85 87 160*   A:   At risk hyperglycemia due to stress steroids.   P:   SSI.  NEUROLOGIC A:   Acute encephalopathy. Seizures. Hx strep pneumo meningitis, hydrocephalus, CVA. P:   Sedation: Fentanyl prn  / propofol  RASS goal: -1. Daily WUA. Neuro consult  appreciated Continuous EEG planned, may not be continued as initial EEG does not show any seizure focus, Neurology to determine.  Continue outpatient keppra, valproic acid, thiamine, folate. Check CT head   FAMILY  - Updates: none at bedside  - Inter-disciplinary family meet or Palliative Care meeting due by:  06/23/16   [name] ACNP [name] Pager [phone] till 3 pm If no answer page [phone] 06/17/2016, 9:36 AM  Attending Note:  I have examined patient, reviewed labs, studies and notes. I have discussed the case with S Minor, and I agree with the data and plans as amended above. 50 yo debilitated man with hx as outlined above - meningitis, seizures, cardiac arrest during hospitalizations this year. Also recent HCAP. Now returns with shock, suspected sepsis, seizures, suspected LLL PNA. ? HCAP vs aspiration PNA. Transferred to ICU at [facility] for further care. On evaluation today he is obtunded. Propofol was just stopped this am. EEG is being done now. He is hypotensive on broad spectrum abx.  We will continue current therapy, try to wean pressors as we wean propofol. May need to further evaluate for endocarditis. On last admission it was felt that his meningitis had been adequately treated - may need to consider this dx depending on the course. We will obtain head CT, follow the EEG and neuro improvement. Independent critical care time is 38 minutes.   Levy Pupa, MD, PhD 06/17/2016, 12:22 PM [facility] [phone] or if no answer [phone]

## 2016-06-17 NOTE — Progress Notes (Signed)
Advanced from 24

## 2016-06-17 NOTE — Progress Notes (Signed)
Neurology MD paged regarding pt sedation. Pt condition and sedation discussed. No new orders given. Will continue to monitor.

## 2016-06-17 NOTE — Consult Note (Signed)
Admission H&P    Chief Complaint: Unresponsive with witnessed seizure activity.  HPI: Dale [name] is an 50 y.o. male with a history of streptococcal meningitis, cardiogenic shock, substance abuse, alcohol abuse, respiratory failure, cerebral thrombosis, dementia and stroke, brought to the ED at [facility] after being found unresponsive. Patient also had a witnessed generalized seizure. No CT or MRI of his brain was obtained. He's been taking Keppra 1000 mg twice a day and valproic acid 250 mg every 8 hours. Valproic acid level was low at 15. No recurrence of seizure activity has been reported. EEG was ordered and is pending. Patient was hypothermic on arrival to the ED. He was thought to likely be septic and started on antibiotic treatment. Chest x-ray showed left lower lobe opacity. He was hypotensive requiring vasopressors. He was intubated for airway protection. Urine drug screen was negative.  Past Medical History  Diagnosis Date  . Depression   . Cardiac arrest ([place])   . Cardiogenic shock ([place])   . Acute respiratory failure with hypoxia ([place])   . Meningitis, streptococcal 02/16/2016  . 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 Tobacco abuse 02/16/2016  . Substance abuse   . ETOH abuse   . Septic shock ([place])   . Urinary retention 05/04/2016    Urinary retention - Continue Flomax     . Systolic and diastolic CHF, chronic ([place])   . 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 Severe sepsis ([place])   . Respiratory failure ([place]) 03/03/2016  . Protein-calorie malnutrition, severe ([place])   . NSTEMI (non-ST elevated myocardial infarction) ([place]) 03/14/2016  . Meningoencephalitis   . Meningitis, pneumococcal, recurrent   . Low serum cortisol level (HCC) 05/04/2016    Low a.m. cortisol  - Was 1.2 checked on 04/03/2016  - This likely is reflective of cortisol suppression from steroid use - Tapered steroids off   . Hypothermia 03/20/2016  . Hypernatremia   . Hyperlipidemia 05/04/2016   Dyslipidemia - Continue statin.     Marland Kitchen Hydrocephalus   . Dysphagia 05/04/2016    Dysphagia - Dysphagia 1 diet per SLP     . Dementia 04/27/2016  . Convulsions/seizures ([place]) 05/04/2016    Seizure disorder - Continue Keppra, Depakote.     . CHF (congestive heart failure) ([place])   . Cerebral thrombosis with cerebral infarction 03/25/2016  . Cerebral septic emboli ([place])   . Altered mental status   . AKI (acute kidney injury) ([place]) 02/16/2016  . Acute renal failure ([place])   . Acute ischemic stroke ([place])   . Acute encephalopathy     Past Surgical History  Procedure Laterality Date  . Ankle surgery    . Cardiac catheterization N/A 02/17/2016    Procedure: IABP Insertion;  Surgeon: Wellington Hampshire, MD;  Location: [facility];  Service: Cardiovascular;  Laterality: N/A;  . Tee without cardioversion N/A 03/29/2016    Procedure: TRANSESOPHAGEAL ECHOCARDIOGRAM (TEE);  Surgeon: Larey Dresser, MD;  Location: [facility];  Service: Cardiovascular;  Laterality: N/A;    History reviewed. No pertinent family history. Social History:  reports that he has been smoking Cigarettes.  He has been smoking about 0.10 packs per day. He does not have any smokeless tobacco history on file. He reports that he uses illicit drugs (Marijuana). He reports that he does not drink alcohol.  Allergies: No Known Allergies  Medications Prior to Admission  Medication Sig Dispense Refill  . albuterol (PROVENTIL) (2.5 MG/3ML) 0.083% nebulizer solution Take 3 mLs (2.5 mg  total) by nebulization every 3 (three) hours as needed for wheezing. (Patient taking differently: Take 2.5 mg by nebulization every 3 (three) hours as needed for wheezing or shortness of breath. ) 75 mL 12  . aspirin 325 MG tablet Take 1 tablet (325 mg total) by mouth daily. 30 tablet 0  . atorvastatin (LIPITOR) 20 MG tablet Take 1 tablet (20 mg total) by mouth daily at 6 PM. 30 tablet 0  . CefTRIAXone Sodium (ROCEPHIN IV) Inject 1 g into the vein daily.  For 7 days    . famotidine (PEPCID) 20 MG tablet Take 1 tablet (20 mg total) by mouth daily.    . feeding supplement, ENSURE ENLIVE, (ENSURE ENLIVE) LIQD Take 237 mLs by mouth every 6 (six) hours. 789 mL 12  . folic acid (FOLVITE) 1 MG tablet Take 1 tablet (1 mg total) by mouth daily. 30 tablet 0  . levETIRAcetam (KEPPRA) 100 MG/ML solution Take 10 mLs (1,000 mg total) by mouth 2 (two) times daily.    Marland Kitchen LORazepam (ATIVAN) 0.5 MG tablet Take 0.5 mg by mouth every 8 (eight) hours as needed for anxiety.     . Maltodextrin-Xanthan Gum ([place]) POWD As needed.    . tamsulosin (FLOMAX) 0.4 MG CAPS capsule Take 1 capsule (0.4 mg total) by mouth daily. 30 capsule 0  . thiamine 100 MG tablet Take 1 tablet (100 mg total) by mouth daily. 30 tablet 0  . valproic acid (DEPAKENE) 250 MG/5ML syrup Take 5 mLs (250 mg total) by mouth every 8 (eight) hours. 240 mL 0  . amoxicillin-clavulanate (AUGMENTIN) 875-125 MG tablet Take 1 tablet by mouth 2 (two) times daily. (Patient not taking: Reported on 06/16/2016) 14 tablet 0    ROS: Unavailable as patient is intubated and on sedation.  Physical Examination: Blood pressure 88/47, pulse 106, temperature 95.9 F (35.5 C), temperature source Core (Comment), resp. rate 9, height '5\' 8"'$  (1.727 m), weight 63 kg (138 lb 14.2 oz), SpO2 100 %.  HEENT-  Normocephalic, no lesions, without obvious abnormality.  Normal external eye and conjunctiva.  Normal TM's bilaterally.  Normal auditory canals and external ears. Normal external nose, mucus membranes and septum.  Normal pharynx. Neck supple with no masses, nodes, nodules or enlargement. Cardiovascular - regular rate and rhythm, S1, S2 normal, no murmur, click, rub or gallop Lungs - chest clear, no wheezing, rales, normal symmetric air entry Abdomen - soft, non-tender; bowel sounds normal; no masses,  no organomegaly Extremities - no joint deformities, effusion, or inflammation and no edema  Neurologic  Examination: Patient was intubated and on mechanical ventilation. He had also received Versed shortly before this evaluation. He was responsive to noxious stimuli. Pupils were equal and reacted normally to light. Extraocular movements were intact although minimal with oculocephalic maneuvers. No facial weakness was noted. Muscle tone was flaccid throughout. There was no abnormal posturing. Patient had no spontaneous movements of extremities. Deep tendon reflexes were 1+ and symmetrical. Plantar responses were flexor bilaterally.  Results for orders placed or performed during the hospital encounter of 06/16/16 (from the past 48 hour(s))  CBG monitoring, ED     Status: None   Collection Time: 06/16/16  5:08 PM  Result Value Ref Range   Glucose-Capillary 85 65 - 99 mg/dL  Culture, blood (Routine x 2)     Status: None (Preliminary result)   Collection Time: 06/16/16  5:40 PM  Result Value Ref Range   Specimen Description      BLOOD RIGHT  FOREARM Performed at [facility]    Special Requests BOTTLES DRAWN AEROBIC AND ANAEROBIC 5CC EACH    Culture PENDING    Report Status PENDING   Valproic acid level     Status: Abnormal   Collection Time: 06/16/16  5:40 PM  Result Value Ref Range   Valproic Acid Lvl 15 (L) 50.0 - 100.0 ug/mL  Comprehensive metabolic panel     Status: Abnormal   Collection Time: 06/16/16  5:58 PM  Result Value Ref Range   Sodium 144 135 - 145 mmol/L   Potassium 4.9 3.5 - 5.1 mmol/L   Chloride 112 (H) 101 - 111 mmol/L   CO2 26 22 - 32 mmol/L   Glucose, Bld 80 65 - 99 mg/dL   BUN 14 6 - 20 mg/dL   Creatinine, Ser 0.76 0.61 - 1.24 mg/dL   Calcium 8.6 (L) 8.9 - 10.3 mg/dL   Total Protein 5.2 (L) 6.5 - 8.1 g/dL   Albumin 2.6 (L) 3.5 - 5.0 g/dL   AST 23 15 - 41 U/L   ALT 23 17 - 63 U/L   Alkaline Phosphatase 59 38 - 126 U/L   Total Bilirubin 0.6 0.3 - 1.2 mg/dL   GFR calc non Af Amer >60 >60 mL/min   GFR calc Af Amer >60 >60 mL/min    Comment: (NOTE) The  eGFR has been calculated using the CKD EPI equation. This calculation has not been validated in all clinical situations. eGFR's persistently <60 mL/min signify possible Chronic Kidney Disease.    Anion gap 6 5 - 15  CBC with Differential     Status: Abnormal   Collection Time: 06/16/16  5:58 PM  Result Value Ref Range   WBC 6.1 4.0 - 10.5 K/uL   RBC 3.76 (L) 4.22 - 5.81 MIL/uL   Hemoglobin 10.9 (L) 13.0 - 17.0 g/dL   HCT 33.2 (L) 39.0 - 52.0 %   MCV 88.3 78.0 - 100.0 fL   MCH 29.0 26.0 - 34.0 pg   MCHC 32.8 30.0 - 36.0 g/dL   RDW 16.2 (H) 11.5 - 15.5 %   Platelets 224 150 - 400 K/uL   Neutrophils Relative % 77 %   Lymphocytes Relative 18 %   Monocytes Relative 5 %   Eosinophils Relative 0 %   Basophils Relative 0 %   Neutro Abs 4.7 1.7 - 7.7 K/uL   Lymphs Abs 1.1 0.7 - 4.0 K/uL   Monocytes Absolute 0.3 0.1 - 1.0 K/uL   Eosinophils Absolute 0.0 0.0 - 0.7 K/uL   Basophils Absolute 0.0 0.0 - 0.1 K/uL   Smear Review MORPHOLOGY UNREMARKABLE   I-Stat CG4 Lactic Acid, ED     Status: None   Collection Time: 06/16/16  6:06 PM  Result Value Ref Range   Lactic Acid, Venous 0.73 0.5 - 1.9 mmol/L  Blood gas, arterial     Status: None   Collection Time: 06/16/16  7:22 PM  Result Value Ref Range   O2 Content 2.0 L/min   Delivery systems NASAL CANNULA    pH, Arterial 7.378 7.350 - 7.450   pCO2 arterial 38.4 35.0 - 45.0 mmHg   pO2, Arterial 82.7 80.0 - 100.0 mmHg   Bicarbonate 23.5 20.0 - 24.0 mEq/L   TCO2 22.8 0 - 100 mmol/L   Acid-base deficit 2.0 0.0 - 2.0 mmol/L   O2 Saturation 97.0 %   Patient temperature 32.7    Collection site BRACHIAL ARTERY    Drawn by [id number]  Sample type ARTERIAL DRAW   Urinalysis, Routine w reflex microscopic     Status: None   Collection Time: 06/16/16  7:47 PM  Result Value Ref Range   Color, Urine YELLOW YELLOW   APPearance CLEAR CLEAR   Specific Gravity, Urine 1.014 1.005 - 1.030   pH 5.5 5.0 - 8.0   Glucose, UA NEGATIVE NEGATIVE mg/dL   Hgb  urine dipstick NEGATIVE NEGATIVE   Bilirubin Urine NEGATIVE NEGATIVE   Ketones, ur NEGATIVE NEGATIVE mg/dL   Protein, ur NEGATIVE NEGATIVE mg/dL   Nitrite NEGATIVE NEGATIVE   Leukocytes, UA NEGATIVE NEGATIVE    Comment: MICROSCOPIC NOT DONE ON URINES WITH NEGATIVE PROTEIN, BLOOD, LEUKOCYTES, NITRITE, OR GLUCOSE <1000 mg/dL.  Urine rapid drug screen (hosp performed)     Status: None   Collection Time: 06/16/16  7:47 PM  Result Value Ref Range   Opiates NONE DETECTED NONE DETECTED   Cocaine NONE DETECTED NONE DETECTED   Benzodiazepines NONE DETECTED NONE DETECTED   Amphetamines NONE DETECTED NONE DETECTED   Tetrahydrocannabinol NONE DETECTED NONE DETECTED   Barbiturates NONE DETECTED NONE DETECTED    Comment:        DRUG SCREEN FOR MEDICAL PURPOSES ONLY.  IF CONFIRMATION IS NEEDED FOR ANY PURPOSE, NOTIFY LAB WITHIN 5 DAYS.        LOWEST DETECTABLE LIMITS FOR URINE DRUG SCREEN Drug Class       Cutoff (ng/mL) Amphetamine      1000 Barbiturate      200 Benzodiazepine   030 Tricyclics       092 Opiates          300 Cocaine          300 THC              50   Ammonia     Status: None   Collection Time: 06/16/16  9:00 PM  Result Value Ref Range   Ammonia 23 9 - 35 umol/L  Cortisol     Status: None   Collection Time: 06/16/16  9:00 PM  Result Value Ref Range   Cortisol, Plasma 4.3 ug/dL    Comment: (NOTE) AM    6.7 - 22.6 ug/dL PM   <10.0       ug/dL Performed at [facility] Lactic Acid, ED     Status: None   Collection Time: 06/16/16  9:10 PM  Result Value Ref Range   Lactic Acid, Venous 0.97 0.5 - 1.9 mmol/L  Troponin I     Status: None   Collection Time: 06/16/16 10:43 PM  Result Value Ref Range   Troponin I <0.03 <0.03 ng/mL  Procalcitonin - Baseline     Status: None   Collection Time: 06/16/16 10:43 PM  Result Value Ref Range   Procalcitonin 0.11 ng/mL    Comment:        Interpretation: PCT (Procalcitonin) <= 0.5 ng/mL: Systemic infection  (sepsis) is not likely. Local bacterial infection is possible. (NOTE)         ICU PCT Algorithm               Non ICU PCT Algorithm    ----------------------------     ------------------------------         PCT < 0.25 ng/mL                 PCT < 0.1 ng/mL     Stopping of antibiotics  Stopping of antibiotics       strongly encouraged.               strongly encouraged.    ----------------------------     ------------------------------       PCT level decrease by               PCT < 0.25 ng/mL       >= 80% from peak PCT       OR PCT 0.25 - 0.5 ng/mL          Stopping of antibiotics                                             encouraged.     Stopping of antibiotics           encouraged.    ----------------------------     ------------------------------       PCT level decrease by              PCT >= 0.25 ng/mL       < 80% from peak PCT        AND PCT >= 0.5 ng/mL            Continuin g antibiotics                                              encouraged.       Continuing antibiotics            encouraged.    ----------------------------     ------------------------------     PCT level increase compared          PCT > 0.5 ng/mL         with peak PCT AND          PCT >= 0.5 ng/mL             Escalation of antibiotics                                          strongly encouraged.      Escalation of antibiotics        strongly encouraged.   Creatinine, serum     Status: None   Collection Time: 06/16/16 10:43 PM  Result Value Ref Range   Creatinine, Ser 0.83 0.61 - 1.24 mg/dL   GFR calc non Af Amer >60 >60 mL/min   GFR calc Af Amer >60 >60 mL/min    Comment: (NOTE) The eGFR has been calculated using the CKD EPI equation. This calculation has not been validated in all clinical situations. eGFR's persistently <60 mL/min signify possible Chronic Kidney Disease.   Blood gas, arterial     Status: Abnormal   Collection Time: 06/16/16 10:43 PM  Result Value Ref Range   FIO2  1.00    Delivery systems VENTILATOR    Mode PRESSURE REGULATED VOLUME CONTROL    VT 620 mL   LHR 14 resp/min   Peep/cpap 5.0 cm H20   pH, Arterial 7.482 (H) 7.350 - 7.450   pCO2 arterial 24.7 (L) 35.0 - 45.0 mmHg   pO2, Arterial 199 (H) 80.0 -  100.0 mmHg   Bicarbonate 18.7 (L) 20.0 - 24.0 mEq/L   TCO2 17.3 0 - 100 mmol/L   Acid-base deficit 4.0 (H) 0.0 - 2.0 mmol/L   O2 Saturation 99.5 %   Patient temperature 35.0    Collection site RIGHT RADIAL    Drawn by [phone]    Sample type ARTERIAL DRAW    Allens test (pass/fail) PASS PASS   Dg Abd 1 View  06/16/2016  CLINICAL DATA:  Orogastric tube placement. EXAM: ABDOMEN - 1 VIEW COMPARISON:  05/25/2016 FINDINGS: An orogastric tube is now seen with tip overlying the distal stomach in the expected region of the pylorus. A left femoral central venous catheter is seen in place as well as a temperature probe within the bladder. The bowel gas pattern is normal. IMPRESSION: Orogastric tube tip overlies the distal stomach, in expected region of pylorus. Electronically Signed   By: Earle Gell M.D.   On: 06/16/2016 20:50   Dg Chest Port 1 View  06/16/2016  CLINICAL DATA:  Endotracheal tube placement EXAM: PORTABLE CHEST 1 VIEW COMPARISON:  06/16/2016 FINDINGS: There is an endotracheal tube with the tip 3.7 cm above the carina. Nasogastric tube coursing below the diaphragm. There is hazy left lower lobe airspace disease. There is no pleural effusion or pneumothorax. The heart and mediastinal contours are unremarkable. The osseous structures are unremarkable. IMPRESSION: 1. Hazy left lower lobe airspace disease concerning for pneumonia. 2. Endotracheal tube with the tip 3.7 cm above the carina. Electronically Signed   By: Kathreen Devoid   On: 06/16/2016 22:41   Dg Chest Port 1 View  06/16/2016  CLINICAL DATA:  Intubation.  Followup pneumonia. EXAM: PORTABLE CHEST 1 VIEW 8:24 p.m.: COMPARISON:  Portable chest x-ray earlier same date 5:49 p.m. and previously.  FINDINGS: Endotracheal tube tip in satisfactory position projecting approximately 6 cm above the carina. Nasogastric tube courses below the diaphragm into the stomach though its tip is not included on the image. Consolidation in the left lower lobe as noted earlier. Developing patchy opacities at the right lung base. IMPRESSION: 1. Support apparatus satisfactory. Endotracheal tube tip approximately 6 cm above the carina. Nasogastric tube courses below the diaphragm into the stomach. 2. Left lower lobe pneumonia as noted earlier. Developing atelectasis and/or pneumonia involving the right lung base. Electronically Signed   By: Evangeline Dakin M.D.   On: 06/16/2016 20:49   Dg Chest Port 1 View  06/16/2016  CLINICAL DATA:  50 year old with possible aspiration and sepsis. EXAM: PORTABLE CHEST 1 VIEW COMPARISON:  05/29/2016, 05/27/2016 and earlier. FINDINGS: Patient is rotated to the left. Cardiomediastinal silhouette unremarkable, unchanged. Airspace consolidation in the retrocardiac left lung base. Lungs otherwise clear. Pulmonary vascularity normal. No visible pleural effusions. IMPRESSION: Left lower lobe pneumonia. Electronically Signed   By: Evangeline Dakin M.D.   On: 06/16/2016 18:22    Assessment/Plan 50 year old man with multiple medical problems and complicated medical history, including stroke, meningitis, dementia and seizure disorder, presenting with unresponsive state and recurrent seizures, likely related to recurrent sepsis. Seizure activity is likely in part due to inadequate medication dosing, with low valproic acid level. Acute intracranial abnormality will need to be ruled out with an imaging study.  Recommendations: 1. Depacon loading dose of 1000 mg followed by 500 mg IV every 8 hours 2. Continue Keppra 1000 mg IV every 12 hours 3. Will obtain EEG and place on continuous EEG monitoring if there are indications of subclinical seizure activity 4. CT scan of the head without  contrast  when feasible  We will continue to follow this patient with you.  C.R. Nicole Kindred, MD [facility] [phone]  06/17/2016, 2:44 AM

## 2016-06-17 NOTE — Progress Notes (Signed)
EEG Completed; Results Pending  

## 2016-06-17 NOTE — Care Management Note (Signed)
Case Management Note  Patient Details  Name: Dale [name] MRN: [id number] Date of Birth: 04/28/1966  Subjective/Objective:  Pt admitted on 06/16/16 with septic shock due to HCAP and seizures.  PTA, [facility]                    Action/Plan: CSW consulted to facilitate possible return to SNF when medically stable.  Will follow progress.    Expected Discharge Date:   (unknown)               Expected Discharge Plan:  Skilled Nursing Facility  In-House Referral:  Clinical Social Work  Discharge planning Services  CM Consult  Post Acute Care Choice:    Choice offered to:     DME Arranged:    DME Agency:     HH Arranged:    HH Agency:     Status of Service:  In process, will continue to follow  If discussed at Long Length of Stay Meetings, dates discussed:    Additional Comments:  Quintella BatonJulie W. Chloeann Alfred, RN, BSN  Trauma/Neuro ICU Case Manager [phone]

## 2016-06-17 NOTE — Procedures (Signed)
Central Venous Catheter Insertion Procedure Note Dale HollowCraig D [name] [id number] 02/09/1966  Procedure: Insertion of Central Venous Catheter Indications: Assessment of intravascular volume, Drug and/or fluid administration and Frequent blood sampling  Procedure Details Consent: Unable to obtain consent because of altered level of consciousness. Time Out: Verified patient identification, verified procedure, site/side was marked, verified correct patient position, special equipment/implants available, medications/allergies/relevent history reviewed, required imaging and test results available.  Performed  Maximum sterile technique was used including antiseptics, cap, gloves, gown, hand hygiene, mask and sheet. Skin prep: Chlorhexidine; local anesthetic administered A antimicrobial bonded/coated triple lumen catheter was placed in the left internal jugular vein using the Seldinger technique.  Evaluation Blood flow good Complications: No apparent complications Patient did tolerate procedure well. Chest X-ray ordered to verify placement.  CXR: pending.  Procedure performed under direct ultrasound guidance for real time vessel cannulation.      Rutherford Guysahul Desai, [place] [facility] Pager: [phone]  or [phone] 06/17/2016, 2:28 AM   Levy Pupaobert Byrum, MD, PhD 06/17/2016, 12:13 PM [facility] [phone] or if no answer [phone]

## 2016-06-17 NOTE — Progress Notes (Signed)
Pt. Was transported to CT & back to 3M05 without any complications.  

## 2016-06-17 NOTE — Progress Notes (Signed)
[facility]. Placed on ventilator on previous setting from [facility].  Tolerating well no problems to report at this time.

## 2016-06-17 NOTE — Progress Notes (Addendum)
After arrival of patient to 263m05, pt central line slid out of pt's femoral site onto bed. Site bleeding. Pressure held onto site for 10 minutes. Bleeding stopped, dressing applied to site. MD notified.

## 2016-06-17 NOTE — Progress Notes (Signed)
eLink Physician-Brief Progress Note Patient Name: Bonnita HollowCraig D Pollok DOB: 11/04/1966 MRN: [id number]   Date of Service  06/17/2016  HPI/Events of Note  Best Practice  eICU Interventions  H2 blocker for stress ulcer propy while on vent     Intervention Category Intermediate Interventions: Best-practice therapies (e.g. DVT, beta blocker, etc.)  DETERDING,ELIZABETH 06/17/2016, 3:23 AM

## 2016-06-18 ENCOUNTER — Inpatient Hospital Stay (HOSPITAL_COMMUNITY): Payer: Medicaid Other

## 2016-06-18 DIAGNOSIS — J9601 Acute respiratory failure with hypoxia: Secondary | ICD-10-CM

## 2016-06-18 LAB — BASIC METABOLIC PANEL
Anion gap: 10 (ref 5–15)
BUN: 8 mg/dL (ref 6–20)
CHLORIDE: 114 mmol/L — AB (ref 101–111)
CO2: 22 mmol/L (ref 22–32)
CREATININE: 1.3 mg/dL — AB (ref 0.61–1.24)
Calcium: 8.6 mg/dL — ABNORMAL LOW (ref 8.9–10.3)
GFR calc Af Amer: >60 mL/min (ref >60)
GFR calc non Af Amer: >60 mL/min (ref >60)
Glucose, Bld: 98 mg/dL (ref 65–99)
Potassium: 3.9 mmol/L (ref 3.5–5.1)
Sodium: 146 mmol/L — ABNORMAL HIGH (ref 135–145)

## 2016-06-18 LAB — MAGNESIUM
Magnesium: 1.3 mg/dL — ABNORMAL LOW (ref 1.7–2.4)
Magnesium: 1.3 mg/dL — ABNORMAL LOW (ref 1.7–2.4)
Magnesium: 1.3 mg/dL — ABNORMAL LOW (ref 1.7–2.4)

## 2016-06-18 LAB — PHOSPHORUS
PHOSPHORUS: 3 mg/dL (ref 2.5–4.6)
Phosphorus: 4.7 mg/dL — ABNORMAL HIGH (ref 2.5–4.6)
Phosphorus: 3.8 mg/dL (ref 2.5–4.6)

## 2016-06-18 LAB — URINE CULTURE: Culture: NO GROWTH

## 2016-06-18 LAB — PROCALCITONIN: PROCALCITONIN: 0.16 ng/mL

## 2016-06-18 LAB — GLUCOSE, CAPILLARY
GLUCOSE-CAPILLARY: 102 mg/dL — AB (ref 65–99)
GLUCOSE-CAPILLARY: 120 mg/dL — AB (ref 65–99)
Glucose-Capillary: 113 mg/dL — ABNORMAL HIGH (ref 65–99)
Glucose-Capillary: 124 mg/dL — ABNORMAL HIGH (ref 65–99)
Glucose-Capillary: 123 mg/dL — ABNORMAL HIGH (ref 65–99)

## 2016-06-18 LAB — CBC
HEMATOCRIT: 26.3 % — AB (ref 39.0–52.0)
HEMOGLOBIN: 8.6 g/dL — AB (ref 13.0–17.0)
MCH: 27.9 pg (ref 26.0–34.0)
MCHC: 32.7 g/dL (ref 30.0–36.0)
MCV: 85.4 fL (ref 78.0–100.0)
Platelets: 141 10*3/uL — ABNORMAL LOW (ref 150–400)
RBC: 3.08 MIL/uL — ABNORMAL LOW (ref 4.22–5.81)
RDW: 16.2 % — ABNORMAL HIGH (ref 11.5–15.5)
WBC: 13.5 10*3/uL — AB (ref 4.0–10.5)

## 2016-06-18 LAB — VANCOMYCIN, TROUGH: VANCOMYCIN TR: 10 ug/mL — AB (ref 15–20)

## 2016-06-18 LAB — TROPONIN I: Troponin I: <0.03 ng/mL (ref <0.03)

## 2016-06-18 LAB — VALPROIC ACID LEVEL: Valproic Acid Lvl: 53 ug/mL (ref 50.0–100.0)

## 2016-06-18 LAB — CALCIUM, IONIZED: Calcium, Ionized, Serum: 4.4 mg/dL — ABNORMAL LOW (ref 4.5–5.6)

## 2016-06-18 MED ORDER — SODIUM CHLORIDE 0.9 % IV SOLN
1000.0000 mg | INTRAVENOUS | Status: AC
  Administered 2016-06-18: 1000 mg via INTRAVENOUS
  Filled 2016-06-18: qty 10

## 2016-06-18 MED ORDER — MAGNESIUM SULFATE 2 GM/50ML IV SOLN
2.0000 g | Freq: Once | INTRAVENOUS | Status: AC
  Administered 2016-06-18: 2 g via INTRAVENOUS
  Filled 2016-06-18: qty 50

## 2016-06-18 MED ORDER — VANCOMYCIN HCL IN DEXTROSE 1-5 GM/200ML-% IV SOLN
1000.0000 mg | Freq: Two times a day (BID) | INTRAVENOUS | Status: DC
  Administered 2016-06-18 – 2016-06-20 (×4): 1000 mg via INTRAVENOUS
  Filled 2016-06-18 (×6): qty 200

## 2016-06-18 MED ORDER — PRO-STAT SUGAR FREE PO LIQD
30.0000 mL | Freq: Two times a day (BID) | ORAL | Status: DC
  Administered 2016-06-18: 30 mL
  Filled 2016-06-18: qty 30

## 2016-06-18 MED ORDER — VITAL AF 1.2 CAL PO LIQD
1320.0000 mL | ORAL | Status: DC
  Administered 2016-06-18: 1320 mL
  Administered 2016-06-19 – 2016-06-21 (×3): 1000 mL
  Filled 2016-06-18 (×5): qty 2000

## 2016-06-18 MED ORDER — VITAL HIGH PROTEIN PO LIQD
1000.0000 mL | ORAL | Status: DC
  Administered 2016-06-18: 1000 mL

## 2016-06-18 MED ORDER — PANTOPRAZOLE SODIUM 40 MG PO PACK
40.0000 mg | PACK | ORAL | Status: DC
Start: 2016-06-18 — End: 2016-06-24
  Administered 2016-06-19 – 2016-06-23 (×5): 40 mg
  Filled 2016-06-18 (×5): qty 20

## 2016-06-18 MED ORDER — VITAMIN B-1 100 MG PO TABS
100.0000 mg | ORAL_TABLET | Freq: Every day | ORAL | Status: DC
  Administered 2016-06-19 – 2016-07-11 (×22): 100 mg via ORAL
  Filled 2016-06-18 (×22): qty 1

## 2016-06-18 MED ORDER — FOLIC ACID 1 MG PO TABS
1.0000 mg | ORAL_TABLET | Freq: Every day | ORAL | Status: DC
  Administered 2016-06-19 – 2016-07-11 (×22): 1 mg via ORAL
  Filled 2016-06-18 (×22): qty 1

## 2016-06-18 MED ORDER — SODIUM CHLORIDE 0.9 % IV SOLN
1500.0000 mg | Freq: Two times a day (BID) | INTRAVENOUS | Status: DC
  Administered 2016-06-18 – 2016-07-03 (×31): 1500 mg via INTRAVENOUS
  Filled 2016-06-18 (×34): qty 15

## 2016-06-18 MED ORDER — VALPROATE SODIUM 500 MG/5ML IV SOLN
750.0000 mg | Freq: Three times a day (TID) | INTRAVENOUS | Status: DC
  Administered 2016-06-18 – 2016-07-03 (×46): 750 mg via INTRAVENOUS
  Filled 2016-06-18 (×50): qty 7.5

## 2016-06-18 NOTE — Progress Notes (Signed)
After being turned, pt began having rhythmic movement of eyes, upward gaze, and rhythmic movement of mouth. Episode lasted <751minute. Post episode, pt had left deviated gaze. Neurology MD notified. Orders received. Will administer medications and continue to monitor.

## 2016-06-18 NOTE — Progress Notes (Signed)
Spoke with E-link nurse regarding pt tube feed orders. No new orders given. Will continue to monitor.

## 2016-06-18 NOTE — Progress Notes (Signed)
Brief Nutrition Note Consult received for enteral/tube feeding initiation and management.  Plan: Adult Enteral Nutrition Protocol initiated.   Vital AF 1.2 @ 25 ml/hr and increase by 10 ml every 8 hours to goal rate of 55 ml/hr  Provides: 1320 ml, 1584 kcal, 99 grams protein, and 1070 ml free H2O.   Monitor magnesium and phosphorus every 12 hours, MD to replete as needed, as pt is at risk for refeeding syndrome given severe malnutrition.   NUTRITION DIAGNOSIS:   Malnutrition (Severe) related to chronic illness (meningitis/dementia) as evidenced by severe depletion of muscle mass, severe depletion of body fat.  Admitting Dx: Acute respiratory failure with hypercapnia (HCC) [J96.02] PNA (pneumonia) [J18.9] Hypothermia, initial encounter [T68.XXXA] Endotracheal tube present [Z78.9] Hypotension, unspecified hypotension type [I95.9] Altered mental status, unspecified altered mental status type [R41.82]  Body mass index is 21.22 kg/(m^2). Pt meets criteria for normal based on current BMI.  Labs: reviewed   Recent Labs Lab 06/16/16 1758 06/16/16 2243 06/17/16 0459 06/18/16 0540 06/18/16 0900  NA 144  --  141 146*  --   K 4.9  --  4.0 3.9  --   CL 112*  --  117* 114*  --   CO2 26  --  20* 22  --   BUN 14  --  10 8  --   CREATININE 0.76 0.83 1.02 1.30*  --   CALCIUM 8.6*  --  7.9* 8.6*  --   MG  --   --  1.3* 1.3* 1.3*  PHOS  --   --  1.6* 4.7* 3.8  GLUCOSE 80  --  124* 98  --    Royann ShiversLynn Raney Koeppen MS,RD,CSG,LDN Office: [phone] Pager: [phone]

## 2016-06-18 NOTE — Progress Notes (Signed)
PULMONARY / CRITICAL CARE MEDICINE   Name: Dale [name] MRN: [id number] DOB: 1966/01/19    ADMISSION DATE:  06/16/2016 CONSULTATION DATE:  06/16/2016  REFERRING MD:  EDP Dr Effie Shy  CHIEF COMPLAINT:  AMS  HISTORY OF PRESENT ILLNESS:   50 year old male with complex medical history as outlined below. He presented to [facility] ED from [facility] 7/13 with complaints of AMS. His recent medical course over the past few months has been complicated as noted in discharge summary from Dr. Malachi Bonds 06/01/16:  3/14-4/6: Admitted with strep pneumo meningitis, c/b cardiac arrest, and cardiogenic shock (EF 20%, recovered to 50%) briefly on amiodarone, ARDS requiring prolonged intubation and trach (now removed), ARF, shock liver, and adrenal insufficiency. He was discharged on 4/6 with weekly Pen G IM until 4/10.  4/16-5/1: Readmitted with fall, altered mental status. Repeat LP had no growth, TEE without vegetation, ultimately transitioned to ceftriaxone 2g BID with plans for >4 weeks IV antibiotics. During this hospitalization, he was planning for SNF placement until the day of discharge when the patient's brother asked to take him home (it appears that this wasn't verified with the patient's estranged but legally married wife).  Since discharge, patient has returned to the ED 4 times because of loss of PICC line, PICC requiring to be flushed, PICC coming out, or patient wandering unattended in the street (he has dementia since his meningitis).  5/24-5/26: Readmitted for placement to SNF, given additional week of rocephin and was supposed to follow up with ID before stopping rocephin.  6/15-6/28: admit for occult sepsis and seizures, no source identified, postulated to be aspiration PNA related. He was treated with broad spectrum ABX, cultures were negative and he was returned to SNF. He never followed up with ID for meningitis ABX recommendations. He was seen by them inpatient who recommended they be stopped.    7/13 again presenting with AMS thought to be septic/PNA in origin but had witnessed seizure in ED. He was found to be hypotensive and hypothermic, however WBC and Lactic were wnl. CXR with LLL opacification He required CVL placement and vasopressors for shock despite aggressive IVF resuscitation.  He will need ICU admission for shock treatment and continuous EEG. He was intubated in the ED for airway protection and transferred to [facility]. At tiime of pccm MD eval - intubated, has femoral line and on levophed    SUBJECTIVE: ?seizures overnight.  Remains on low dose levophed  VITAL SIGNS: BP 127/84 mmHg  Pulse 84  Temp(Src) 98.4 F (36.9 C) (Core (Comment))  Resp 14  Ht 5\' 8"  (1.727 m)  Wt 63.3 kg (139 lb 8.8 oz)  BMI 21.22 kg/m2  SpO2 100%  HEMODYNAMICS: CVP:  [0 mmHg-16 mmHg] 7 mmHg  VENTILATOR SETTINGS: Vent Mode:  [-] PRVC FiO2 (%):  [40 %] 40 % Set Rate:  [14 bmp] 14 bmp Vt Set:  [620 mL] 620 mL PEEP:  [5 cmH20] 5 cmH20 Plateau Pressure:  [15 cmH20-16 cmH20] 15 cmH20  INTAKE / OUTPUT: I/O last 3 completed shifts: In: 4884.6 [I.V.:3619.6; IV Piggyback:1265] Out: 2510 [Urine:2510]  PHYSICAL EXAMINATION: General:  Critically and chronically ill looking male.  On ventilator, NAD  Neuro:  RASS -3 equivalent . Off propofol, does not open eyes, does not follow commands HEENT:  Intubated. OGT, PERL pinpoint Cardiovascular:  Normal heart sounds Lungs:  resps even non labored on vent, diminished bases otherwise clear Abdomen:  soft Musculoskeletal:  No cyanosis. No clubbing Skin:  Unclear if decub  posteriorly  LABS: PULMONARY  Recent Labs Lab 06/16/16 1922 06/16/16 2243  PHART 7.378 7.482*  PCO2ART 38.4 24.7*  PO2ART 82.7 199*  HCO3 23.5 18.7*  TCO2 22.8 17.3  O2SAT 97.0 99.5    CBC  Recent Labs Lab 06/16/16 1758 06/17/16 0459 06/18/16 0540  HGB 10.9* 9.7* 8.6*  HCT 33.2* 28.8* 26.3*  WBC 6.1 10.9* 13.5*  PLT 224 239 141*    COAGULATION  Recent  Labs Lab 06/17/16 0459  INR 1.20    CARDIAC    Recent Labs Lab 06/16/16 2243 06/17/16 0557 06/17/16 2240 06/18/16 0540  TROPONINI <0.03 <0.03 <0.03 <0.03   No results for input(s): PROBNP in the last 168 hours.   CHEMISTRY  Recent Labs Lab 06/16/16 1758 06/16/16 2243 06/17/16 0459 06/18/16 0540  NA 144  --  141 146*  K 4.9  --  4.0 3.9  CL 112*  --  117* 114*  CO2 26  --  20* 22  GLUCOSE 80  --  124* 98  BUN 14  --  10 8  CREATININE 0.76 0.83 1.02 1.30*  CALCIUM 8.6*  --  7.9* 8.6*  MG  --   --  1.3* 1.3*  PHOS  --   --  1.6* 4.7*   Estimated Creatinine Clearance: 60.2 mL/min (by C-G formula based on Cr of 1.3).   LIVER  Recent Labs Lab 06/16/16 1758 06/17/16 0459  AST 23  --   ALT 23  --   ALKPHOS 59  --   BILITOT 0.6  --   PROT 5.2*  --   ALBUMIN 2.6*  --   INR  --  1.20     INFECTIOUS  Recent Labs Lab 06/16/16 1806 06/16/16 2110 06/16/16 2243 06/17/16 0459 06/18/16 0540  LATICACIDVEN 0.73 0.97  --   --   --   PROCALCITON  --   --  0.11 0.23 0.16     ENDOCRINE CBG (last 3)   Recent Labs  06/17/16 2334 06/18/16 0347 06/18/16 0750  GLUCAP 130* 113* 124*     Dg Abd 1 View  06/16/2016  CLINICAL DATA:  Orogastric tube placement. EXAM: ABDOMEN - 1 VIEW COMPARISON:  05/25/2016 FINDINGS: An orogastric tube is now seen with tip overlying the distal stomach in the expected region of the pylorus. A left femoral central venous catheter is seen in place as well as a temperature probe within the bladder. The bowel gas pattern is normal. IMPRESSION: Orogastric tube tip overlies the distal stomach, in expected region of pylorus. Electronically Signed   By: Myles Rosenthal M.D.   On: 06/16/2016 20:50   Ct Head Wo Contrast  06/17/2016  CLINICAL DATA:  Unresponsive EXAM: CT HEAD WITHOUT CONTRAST TECHNIQUE: Contiguous axial images were obtained from the base of the skull through the vertex without intravenous contrast. COMPARISON:  05/19/2016  FINDINGS: The bony calvarium is intact. Increased mucosal thickening is noted within the ethmoid and sphenoid sinus. Air-fluid levels are noted within the maxillary antra bilaterally consistent with a more acute degree of sinusitis. Lacunar infarct is noted within the left basal ganglia and left thalamus extending into the deep white matter new from the prior exam. No findings to suggest acute hemorrhage are noted. IMPRESSION: New areas of subacute to chronic ischemia on the left. Progression in the degree of sinusitis as described. Electronically Signed   By: Alcide Clever M.D.   On: 06/17/2016 13:06   Dg Chest Port 1 View  06/17/2016  CLINICAL DATA:  50 year old male with enteric tube placement. EXAM: PORTABLE CHEST 1 VIEW COMPARISON:  A abdominal radiograph dated 06/16/2016 FINDINGS: An endotracheal tube is noted with tip approximately 3 cm above the carina. Single portable view of the chest demonstrates patchy area of hazy airspace opacity in the left mid to lower lung field. There is no pleural effusion or pneumothorax. The cardiac silhouette is within normal limits. Left IJ central line with tip over central SVC. An enteric tube extends into the epigastric area with tip superimposed over the right L3 pedicle likely in the distal stomach. There is no bowel dilatation or evidence of obstruction. No free air. No radiopaque calculi or foreign object. The soft tissues and osseous structures appear unremarkable. The previously seen left femoral central venous catheter has been removed. A temperature probe is partially visualized over the bladder. IMPRESSION: Endotracheal tube above the carina. Patchy area of airspace density in the left mid to lower lung field. Follow-up recommended. Enteric tube in the distal stomach.  No bowel obstruction. Electronically Signed   By: Elgie Collard M.D.   On: 06/17/2016 02:48   Dg Chest Port 1 View  06/16/2016  CLINICAL DATA:  Endotracheal tube placement EXAM: PORTABLE  CHEST 1 VIEW COMPARISON:  06/16/2016 FINDINGS: There is an endotracheal tube with the tip 3.7 cm above the carina. Nasogastric tube coursing below the diaphragm. There is hazy left lower lobe airspace disease. There is no pleural effusion or pneumothorax. The heart and mediastinal contours are unremarkable. The osseous structures are unremarkable. IMPRESSION: 1. Hazy left lower lobe airspace disease concerning for pneumonia. 2. Endotracheal tube with the tip 3.7 cm above the carina. Electronically Signed   By: Elige Ko   On: 06/16/2016 22:41   Dg Chest Port 1 View  06/16/2016  CLINICAL DATA:  Intubation.  Followup pneumonia. EXAM: PORTABLE CHEST 1 VIEW 8:24 p.m.: COMPARISON:  Portable chest x-ray earlier same date 5:49 p.m. and previously. FINDINGS: Endotracheal tube tip in satisfactory position projecting approximately 6 cm above the carina. Nasogastric tube courses below the diaphragm into the stomach though its tip is not included on the image. Consolidation in the left lower lobe as noted earlier. Developing patchy opacities at the right lung base. IMPRESSION: 1. Support apparatus satisfactory. Endotracheal tube tip approximately 6 cm above the carina. Nasogastric tube courses below the diaphragm into the stomach. 2. Left lower lobe pneumonia as noted earlier. Developing atelectasis and/or pneumonia involving the right lung base. Electronically Signed   By: Hulan Saas M.D.   On: 06/16/2016 20:49   Dg Chest Port 1 View  06/16/2016  CLINICAL DATA:  50 year old with possible aspiration and sepsis. EXAM: PORTABLE CHEST 1 VIEW COMPARISON:  05/29/2016, 05/27/2016 and earlier. FINDINGS: Patient is rotated to the left. Cardiomediastinal silhouette unremarkable, unchanged. Airspace consolidation in the retrocardiac left lung base. Lungs otherwise clear. Pulmonary vascularity normal. No visible pleural effusions. IMPRESSION: Left lower lobe pneumonia. Electronically Signed   By: Hulan Saas M.D.   On:  06/16/2016 18:22   Dg Abd Portable 1v  06/17/2016  CLINICAL DATA:  50 year old male with enteric tube placement. EXAM: PORTABLE CHEST 1 VIEW COMPARISON:  A abdominal radiograph dated 06/16/2016 FINDINGS: An endotracheal tube is noted with tip approximately 3 cm above the carina. Single portable view of the chest demonstrates patchy area of hazy airspace opacity in the left mid to lower lung field. There is no pleural effusion or pneumothorax. The cardiac silhouette is within normal limits. Left IJ central line with tip over central  SVC. An enteric tube extends into the epigastric area with tip superimposed over the right L3 pedicle likely in the distal stomach. There is no bowel dilatation or evidence of obstruction. No free air. No radiopaque calculi or foreign object. The soft tissues and osseous structures appear unremarkable. The previously seen left femoral central venous catheter has been removed. A temperature probe is partially visualized over the bladder. IMPRESSION: Endotracheal tube above the carina. Patchy area of airspace density in the left mid to lower lung field. Follow-up recommended. Enteric tube in the distal stomach.  No bowel obstruction. Electronically Signed   By: Elgie Collard M.D.   On: 06/17/2016 02:48   CULTURES: Blood 07/13 > Urine 07/13 > Tracheal Aspirate 07/13 >  ANTIBIOTICS: Vanc 07/13 > Zosyn 07/13 >  SIGNIFICANT EVENTS: 07/13 > admitted with septic shock due to HCAP and seizures. CXR 07/13 > LLL PNA. cEEG 07/13 > 0rdered - to start after transfer [facility] [place] [facility]  STUDIES:  CT head 7/14>>> New areas of subacute to chronic ischemia on the left. Progression in the degree of sinusitis as described.  LINES/TUBES: ETT 07/13 > L IJ CVL 07/13 >    DISCUSSION: 50 y.o. M with complex PMH including recent admissions for strep pneumo meningitis c/b cardiac arrest and cardiogenic shock (March 2017).  Had trach at the time but has since been decannulated.  Now  resides at [facility] and was admitted again 07/13 with septic shock shock due to PNA as well as seizures.  He required intubation in ED for airway protection.   ASSESSMENT / PLAN:  PULMONARY A: Ventilator dependence due to inability to protect the airway in the setting of seizures. HCAP. Hx tracheostomy in March 2017, now decannulated. P:   Continue full vent support. Wean as mental status allows  Abx / cultures per ID section. Pulmonary hygiene. F/u CXR   CARDIOVASCULAR A:  Septic shock - due to ?HCAP Hx cardiac arrest, cardiogenic shock, NSTEMI, HLD, CHF (EF 45 - 50% per echo from June 2017). Cortisol 4.3 P:  Continue levophed as needed for goal MAP > 65. Stress steroids, continue given cortisol 4.3 Troponin and lactate neg  Continue outpatient ASA, atorvastatin. Wean propofol given hemodynamic effects.  Consider repeat echo to further eval for endocarditis   RENAL A:   Pseudohypocalcemia - corrects to 9.0 7/14 Hypophosphoremia   hypoMg P:   NS @ 75. BMP monitor Replete electrolytes PRN   GASTROINTESTINAL A:   GI prophylaxis. Nutrition. P:   SUP: Pantoprazole. NPO. Start TF's 7/15  HEMATOLOGIC A:   Mild anemia - chronic. VTE prophylaxis. P:  Transfuse for Hgb < 7. SCD's / Heparin. CBC in AM.  INFECTIOUS A:   Septic shock - possible HCAP -- PCT neg.  No clear evidence to support recurrent meningitis or endocarditis.  Hx strep pneumo meningitis (march 2017). P:   Abx as above (vanc / zosyn). Narrow quickly as able  Follow cultures. Consider repeat echo May also consider repeat LP  ENDOCRINE A:   At risk hyperglycemia due to stress steroids.   P:   SSI.  NEUROLOGIC A:   Acute encephalopathy. Seizures. Hx strep pneumo meningitis, hydrocephalus, CVA. CT head with new areas of subacute ischemia P:   Sedation: Fentanyl prn   RASS goal: -1. Daily WUA. Neuro consult appreciated ?continuous EEG planned  Continue outpatient keppra, valproic  acid, thiamine, folate.   FAMILY  - Updates: none at bedside 7/15  - Inter-disciplinary family meet or Palliative Care meeting  due by:  06/23/16     Dirk DressKaty Whiteheart, NP 06/18/2016  8:36 AM Pager: [phone] or [phone]   Attending note: Remains on full vent support.  RASS -3.  No wheeze.  HR regular.  Abd soft.  1+ edema.  CXR with ATX  Hb 8.6, Na 146, Creatinine 1.3  Assessment/plan: Acute respiratory failure. - full vent support  HCAP. - continue Abx  Status epilepticus. - per neurology  CC time by me independent of APP Time 31 minutes.  Coralyn HellingVineet Srihith Aquilino, MD [facility] 06/18/2016, 11:37 AM Pager:  [phone] After 3pm call: [phone]

## 2016-06-18 NOTE — Progress Notes (Signed)
Pharmacy Antibiotic Note  Dale [name] is a 50 y.o. male with PMHx significant for recent strep pneumo meningitis with prolonged antibiotic course, stroke, dementia, seizures, CHF, and substance abuse, admitted on 06/16/2016 with possible aspiration and sepsis.  CXR shows LLL PNA.  Pharmacy has been consulted for Vancomycin and Zosyn dosing. A vancomycin trough today is low at 10 despite increasing Scr.   Plan: Change vancomycin to 1gm IV Q12H Continue Zosyn 3.375g IV q8h (infuse over 4 hours) F/u renal function, cultures, VT at Css as warranted, clinical course   Temp (24hrs), Avg:98.1 F (36.7 C), Min:96.4 F (35.8 C), Max:99.9 F (37.7 C)   Recent Labs Lab 06/16/16 1758 06/16/16 1806 06/16/16 2110 06/16/16 2243 06/17/16 0459 06/18/16 0540 06/18/16 1907  WBC 6.1  --   --   --  10.9* 13.5*  --   CREATININE 0.76  --   --  0.83 1.02 1.30*  --   LATICACIDVEN  --  0.73 0.97  --   --   --   --   VANCOTROUGH  --   --   --   --   --   --  10*    Estimated Creatinine Clearance: 60.2 mL/min (by C-G formula based on Cr of 1.3).    No Known Allergies  Antimicrobials this admission: PTA Ceftriaxone 7/13 Vancomycin >>  7/13 Zosyn >>   Dose adjustments this admission: Recent Vanc Troughs: 4/20 = 22 on Vanc 500 q12h (SCr = 1.4-1.8) 4/23 = 12 on Vanc 1g q24h (SCr = 1.3-1.5) 6/19 = 29 on Vanc 750mg  q12h (1.1-1.2) 7/15 = 10 on 750mg  Q12H  Microbiology results: 7/14 MRSA - NEG 7/13 Blood - NGTD  Thank you for allowing pharmacy to be a part of this patient's care.  Lysle Pearlachel Laurine Kuyper, PharmD, BCPS Pager # [phone] 06/18/2016 7:37 PM

## 2016-06-18 NOTE — Progress Notes (Signed)
Subjective:  Seizures in the setting of meningitis.  Exam: Filed Vitals:   06/18/16 0700 06/18/16 0838  BP: 127/84 109/76  Pulse: 84 84  Temp: 98.4 F (36.9 C)   Resp: 14 14    HEENT-  Normocephalic, no lesions, without obvious abnormality.  Normal external eye and conjunctiva.  Normal TM's bilaterally.  Normal auditory canals and external ears. Normal external nose, mucus membranes and septum.  Normal pharynx. Cardiovascular- regular rate and rhythm, S1, S2 normal, no murmur, click, rub or gallop, pulses palpable throughout   Lungs- chest clear, no wheezing, rales, normal symmetric air entry, Heart exam - S1, S2 normal, no murmur, no gallop, rate regular Abdomen- soft, non-tender; bowel sounds normal; no masses,  no organomegaly Extremities- mild edema Musculoskeletal-no joint tenderness, deformity or swelling Skin-warm and dry, no hyperpigmentation, vitiligo, or suspicious lesions    Gen: In bed, intubated MS: does not follow commands [id number]:pupils equal, reactive, spontaneous eye movements, no facial asymmetry Motor: sluggish withdrawal of all four extremities, no movements to command Sensory: grossly present in a four extremities.   Pertinent Labs/Diagnostics: reviewed  Impression:   Dale [name]'s exam appears unchanged from yesterday.  There is no clear evidence of seizure activity today.  His case was discussed with his nurse.    The EEG performed yesterday morning revealed no epileptiform discharges.  Diffuse slowing was noted, consistent with moderate to severe encephalopathy.  Recommendations: 1) Continue Keppra 1000mg  bid 2. Will follow   James A. Hilda BladesArmstrong, M.D. Neurohospitalist Phone: [phone]   06/18/2016, 9:08 AM

## 2016-06-18 NOTE — Procedures (Signed)
History: Dale [name] is a 50 year-old patient with a history of events suspicious for possible seizure activity.   Technique: This is a 21 channel routine scalp EEG performed at the bedside with bipolar and monopolar montages arranged in accordance to the international 10/20 system of electrode placement. One channel was dedicated to EKG recording.    Background: The background consists of intermixed alpha and beta activities. There is a well defined posterior dominant rhythm of 5-6 Hz.  That appears reactive.  Photic stimulation: Was not performed.  EEG Abnormalities: No epileptiform discharges were noted.  Clinical Interpretation: This was an abnormal EEG due to the presence of moderate background slowing.  This slowing is non-specific and is indicative of encephalopathy.  Dr. Rudy JewJames A. Hilda BladesArmstrong, MD Neurohospitalist

## 2016-06-19 ENCOUNTER — Inpatient Hospital Stay (HOSPITAL_COMMUNITY): Payer: Medicaid Other

## 2016-06-19 LAB — GLUCOSE, CAPILLARY
GLUCOSE-CAPILLARY: 102 mg/dL — AB (ref 65–99)
GLUCOSE-CAPILLARY: 117 mg/dL — AB (ref 65–99)
GLUCOSE-CAPILLARY: 137 mg/dL — AB (ref 65–99)
Glucose-Capillary: 100 mg/dL — ABNORMAL HIGH (ref 65–99)
Glucose-Capillary: 105 mg/dL — ABNORMAL HIGH (ref 65–99)
Glucose-Capillary: 109 mg/dL — ABNORMAL HIGH (ref 65–99)
Glucose-Capillary: 111 mg/dL — ABNORMAL HIGH (ref 65–99)

## 2016-06-19 LAB — CBC
HCT: 24.3 % — ABNORMAL LOW (ref 39.0–52.0)
Hemoglobin: 8.1 g/dL — ABNORMAL LOW (ref 13.0–17.0)
MCH: 28.6 pg (ref 26.0–34.0)
MCHC: 33.3 g/dL (ref 30.0–36.0)
MCV: 85.9 fL (ref 78.0–100.0)
Platelets: 96 10*3/uL — ABNORMAL LOW (ref 150–400)
RBC: 2.83 MIL/uL — ABNORMAL LOW (ref 4.22–5.81)
RDW: 16.6 % — ABNORMAL HIGH (ref 11.5–15.5)
WBC: 11.1 10*3/uL — ABNORMAL HIGH (ref 4.0–10.5)

## 2016-06-19 LAB — BASIC METABOLIC PANEL
ANION GAP: 8 (ref 5–15)
BUN: 13 mg/dL (ref 6–20)
CALCIUM: 8.3 mg/dL — AB (ref 8.9–10.3)
CO2: 21 mmol/L — ABNORMAL LOW (ref 22–32)
Chloride: 115 mmol/L — ABNORMAL HIGH (ref 101–111)
Creatinine, Ser: 1.01 mg/dL (ref 0.61–1.24)
GFR calc non Af Amer: >60 mL/min (ref >60)
GLUCOSE: 97 mg/dL (ref 65–99)
Potassium: 3 mmol/L — ABNORMAL LOW (ref 3.5–5.1)
SODIUM: 144 mmol/L (ref 135–145)

## 2016-06-19 LAB — MAGNESIUM
MAGNESIUM: 1.9 mg/dL (ref 1.7–2.4)
Magnesium: 1.9 mg/dL (ref 1.7–2.4)

## 2016-06-19 LAB — PHOSPHORUS
PHOSPHORUS: 3.6 mg/dL (ref 2.5–4.6)
Phosphorus: 2.7 mg/dL (ref 2.5–4.6)

## 2016-06-19 LAB — PROCALCITONIN: PROCALCITONIN: 0.1 ng/mL

## 2016-06-19 MED ORDER — POTASSIUM CHLORIDE 20 MEQ/15ML (10%) PO SOLN
30.0000 meq | ORAL | Status: AC
  Administered 2016-06-19 (×2): 30 meq
  Filled 2016-06-19 (×2): qty 30

## 2016-06-19 NOTE — Progress Notes (Signed)
PULMONARY / CRITICAL CARE MEDICINE   Name: Dale [name] MRN: [id number] DOB: 1966/10/29    ADMISSION DATE:  06/16/2016 CONSULTATION DATE:  06/16/2016  REFERRING MD:  EDP Dr Effie Shy  CHIEF COMPLAINT:  AMS  HISTORY OF PRESENT ILLNESS:   50 year old male with complex medical history as outlined below. He presented to [facility] ED from [facility] 7/13 with complaints of AMS. His recent medical course over the past few months has been complicated as noted in discharge summary from Dr. Malachi Bonds 06/01/16:  3/14-4/6: Admitted with strep pneumo meningitis, c/b cardiac arrest, and cardiogenic shock (EF 20%, recovered to 50%) briefly on amiodarone, ARDS requiring prolonged intubation and trach (now removed), ARF, shock liver, and adrenal insufficiency. He was discharged on 4/6 with weekly Pen G IM until 4/10.  4/16-5/1: Readmitted with fall, altered mental status. Repeat LP had no growth, TEE without vegetation, ultimately transitioned to ceftriaxone 2g BID with plans for >4 weeks IV antibiotics. During this hospitalization, he was planning for SNF placement until the day of discharge when the patient's brother asked to take him home (it appears that this wasn't verified with the patient's estranged but legally married wife).  Since discharge, patient has returned to the ED 4 times because of loss of PICC line, PICC requiring to be flushed, PICC coming out, or patient wandering unattended in the street (he has dementia since his meningitis).  5/24-5/26: Readmitted for placement to SNF, given additional week of rocephin and was supposed to follow up with ID before stopping rocephin.  6/15-6/28: admit for occult sepsis and seizures, no source identified, postulated to be aspiration PNA related. He was treated with broad spectrum ABX, cultures were negative and he was returned to SNF. He never followed up with ID for meningitis ABX recommendations. He was seen by them inpatient who recommended they be stopped.    7/13 again presenting with AMS thought to be septic/PNA in origin but had witnessed seizure in ED. He was found to be hypotensive and hypothermic, however WBC and Lactic were wnl. CXR with LLL opacification He required CVL placement and vasopressors for shock despite aggressive IVF resuscitation.  He will need ICU admission for shock treatment and continuous EEG. He was intubated in the ED for airway protection and transferred to [facility]. At tiime of pccm MD eval - intubated, has femoral line and on levophed    SUBJECTIVE: No further seizure activity noted.  No sig change overnight. Low dose levophed - weaning off.   VITAL SIGNS: BP 155/92 mmHg  Pulse 47  Temp(Src) 97.5 F (36.4 C) (Core (Comment))  Resp 16  Ht 5\' 8"  (1.727 m)  Wt 64 kg (141 lb 1.5 oz)  BMI 21.46 kg/m2  SpO2 100%  HEMODYNAMICS: CVP:  [4 mmHg-10 mmHg] 4 mmHg  VENTILATOR SETTINGS: Vent Mode:  [-] PRVC FiO2 (%):  [40 %] 40 % Set Rate:  [14 bmp] 14 bmp Vt Set:  [620 mL] 620 mL PEEP:  [5 cmH20] 5 cmH20 Plateau Pressure:  [16 cmH20] 16 cmH20  INTAKE / OUTPUT: I/O last 3 completed shifts: In: 5676.3 [I.V.:2733.8; NG/GT:1415; IV Piggyback:1527.5] Out: 1526 [Urine:1525; Stool:1]  PHYSICAL EXAMINATION: General:  Critically and chronically ill looking male.  On ventilator, NAD  Neuro:  RASS -3 equivalent . Off all sedation, does not open eyes, does not follow commands HEENT:  Intubated. OGT, PERL pinpoint Cardiovascular:  Normal heart sounds Lungs:  resps even non labored on vent, diminished bases otherwise clear Abdomen:  soft  Musculoskeletal:  No cyanosis. No clubbing   LABS: PULMONARY  Recent Labs Lab 06/16/16 1922 06/16/16 2243  PHART 7.378 7.482*  PCO2ART 38.4 24.7*  PO2ART 82.7 199*  HCO3 23.5 18.7*  TCO2 22.8 17.3  O2SAT 97.0 99.5    CBC  Recent Labs Lab 06/17/16 0459 06/18/16 0540 06/19/16 0400  HGB 9.7* 8.6* 8.1*  HCT 28.8* 26.3* 24.3*  WBC 10.9* 13.5* 11.1*  PLT 239 141* 96*     COAGULATION  Recent Labs Lab 06/17/16 0459  INR 1.20    CARDIAC    Recent Labs Lab 06/16/16 2243 06/17/16 0557 06/17/16 2240 06/18/16 0540  TROPONINI <0.03 <0.03 <0.03 <0.03   No results for input(s): PROBNP in the last 168 hours.   CHEMISTRY  Recent Labs Lab 06/16/16 1758 06/16/16 2243 06/17/16 0459 06/18/16 0540 06/18/16 0900 06/18/16 1650 06/19/16 0400  NA 144  --  141 146*  --   --  144  K 4.9  --  4.0 3.9  --   --  3.0*  CL 112*  --  117* 114*  --   --  115*  CO2 26  --  20* 22  --   --  21*  GLUCOSE 80  --  124* 98  --   --  97  BUN 14  --  10 8  --   --  13  CREATININE 0.76 0.83 1.02 1.30*  --   --  1.01  CALCIUM 8.6*  --  7.9* 8.6*  --   --  8.3*  MG  --   --  1.3* 1.3* 1.3* 1.3* 1.9  PHOS  --   --  1.6* 4.7* 3.8 3.0 3.6   Estimated Creatinine Clearance: 78.3 mL/min (by C-G formula based on Cr of 1.01).   LIVER  Recent Labs Lab 06/16/16 1758 06/17/16 0459  AST 23  --   ALT 23  --   ALKPHOS 59  --   BILITOT 0.6  --   PROT 5.2*  --   ALBUMIN 2.6*  --   INR  --  1.20     INFECTIOUS  Recent Labs Lab 06/16/16 1806 06/16/16 2110  06/17/16 0459 06/18/16 0540 06/19/16 0400  LATICACIDVEN 0.73 0.97  --   --   --   --   PROCALCITON  --   --   < > 0.23 0.16 0.10  < > = values in this interval not displayed.   ENDOCRINE CBG (last 3)   Recent Labs  06/18/16 2039 06/18/16 2359 06/19/16 0327  GLUCAP 120* 102* 100*     Ct Head Wo Contrast  06/17/2016  CLINICAL DATA:  Unresponsive EXAM: CT HEAD WITHOUT CONTRAST TECHNIQUE: Contiguous axial images were obtained from the base of the skull through the vertex without intravenous contrast. COMPARISON:  05/19/2016 FINDINGS: The bony calvarium is intact. Increased mucosal thickening is noted within the ethmoid and sphenoid sinus. Air-fluid levels are noted within the maxillary antra bilaterally consistent with a more acute degree of sinusitis. Lacunar infarct is noted within the left basal  ganglia and left thalamus extending into the deep white matter new from the prior exam. No findings to suggest acute hemorrhage are noted. IMPRESSION: New areas of subacute to chronic ischemia on the left. Progression in the degree of sinusitis as described. Electronically Signed   By: Alcide CleverMark  Lukens M.D.   On: 06/17/2016 13:06   Dg Chest Portable 1 View  06/19/2016  CLINICAL DATA:  Respiratory failure EXAM: PORTABLE CHEST 1  VIEW COMPARISON:  06/18/2016 FINDINGS: ET tube tip is above the carina. There is a left IJ catheter with tip in the projection of the SVC. Enteric tube tip is below the hemidiaphragm. Persistent opacity within the left lung base. Right lung is clear. IMPRESSION: 1. Stable position of the support apparatus. 2. No change in left base opacity. Electronically Signed   By: Signa Kell M.D.   On: 06/19/2016 07:44   Dg Chest Port 1 View  06/18/2016  CLINICAL DATA:  51 year old male with a history of respiratory failure EXAM: PORTABLE CHEST 1 VIEW COMPARISON:  06/17/2016, 06/16/2016 FINDINGS: Cardiomediastinal silhouette unchanged in size and contour. Endotracheal tube unchanged, terminating 3.8 cm above the carina. Unchanged left IJ approach central venous catheter, appearing to terminate superior vena cava. Gastric tube projects over the mediastinum, terminating out of the field of view. No visualized pneumothorax. Improved aeration in the left mid lung, with persistent opacity in the retrocardiac region. Blunting of left costophrenic angle. IMPRESSION: Improved aeration of the left lung, with persistent left basilar opacity, potentially consolidation, edema, and/ or atelectasis. Small left pleural effusion not excluded. Unchanged position of endotracheal tube, gastric tube, left IJ central venous catheter. Signed, Yvone Neu. Loreta Ave, DO [facility] Electronically Signed   By: Gilmer Mor D.O.   On: 06/18/2016 10:15   CULTURES: Blood  07/13 > Urine 07/13 >neg  Tracheal Aspirate 07/13 >  ANTIBIOTICS: Vanc 07/13 > Zosyn 07/13 >  SIGNIFICANT EVENTS: 07/13 > admitted with septic shock due to HCAP and seizures. CXR 07/13 > LLL PNA. cEEG 07/13 > 0rdered - to start after [facility] [place] [facility]  STUDIES:  CT head 7/14>>> New areas of subacute to chronic ischemia on the left. Progression in the degree of sinusitis as described. EEG 7/14>>> This was an abnormal EEG due to the presence of moderate background slowing. This slowing is non-specific and is indicative of encephalopathy.  LINES/TUBES: ETT 07/13 > L IJ CVL 07/13 >   DISCUSSION: 50 y.o. M with complex PMH including recent admissions for strep pneumo meningitis c/b cardiac arrest and cardiogenic shock (March 2017).  Had trach at the time but has since been decannulated.  Now resides at [facility] and was admitted again 07/13 with septic shock shock due to PNA as well as seizures.  He required intubation in ED for airway protection.   ASSESSMENT / PLAN:  PULMONARY A: Ventilator dependence due to inability to protect the airway in the setting of seizures. HCAP. Hx tracheostomy in March 2017, now decannulated. P:   Continue full vent support. Wean as mental status allows  Abx / cultures per ID section. Pulmonary hygiene. F/u CXR   CARDIOVASCULAR A:  Septic shock - due to ?HCAP Hx cardiac arrest, cardiogenic shock, NSTEMI, HLD, CHF (EF 45 - 50% per echo from June 2017). Cortisol 4.3 P:  Continue levophed as needed for goal MAP > 65 - wean off as able  Continue stress steroids, continue given cortisol 4.3 Continue outpatient ASA, atorvastatin. Consider repeat echo to further eval for endocarditis   RENAL A:   Pseudohypocalcemia - corrects to 9.0 7/14 Hypophosphoremia   hypoMg P:   NS @ 75. BMP monitor Replete electrolytes PRN   GASTROINTESTINAL A:   GI prophylaxis. Nutrition. P:   SUP: Pantoprazole. NPO. Cont TF  HEMATOLOGIC A:   Mild  anemia - chronic. VTE prophylaxis. P:  Transfuse for Hgb < 7. SCD's / Heparin. CBC in AM.  INFECTIOUS A:  Septic shock - possible HCAP -- PCT neg.  No clear evidence to support recurrent meningitis or endocarditis.  Hx strep pneumo meningitis (march 2017). P:   Abx as above (vanc / zosyn). Narrow quickly as able  Follow cultures. Consider repeat echo May also consider repeat LP  ENDOCRINE A:   At risk hyperglycemia due to stress steroids.   P:   SSI.  NEUROLOGIC A:   Acute encephalopathy. Seizures. Hx strep pneumo meningitis, hydrocephalus, CVA. CT head with new areas of subacute ischemia P:   Sedation: Fentanyl prn   RASS goal: -1. Daily WUA. Neuro consult appreciated Continue outpatient keppra, valproic acid, thiamine, folate.   FAMILY  - Updates: none at bedside 7/15  - Inter-disciplinary family meet or Palliative Care meeting due by:  06/23/16     Dirk Dress, NP 06/19/2016  8:52 AM Pager: [phone]) [phone] or [phone]  Attending note: Remains on full vent support.  RASS -3. No wheeze. HR regular. Abd soft. 1+ edema.  CXR with ATX  Hb 8.6, Na 146, Creatinine 1.3  Assessment/plan: Acute respiratory failure. - full vent support  HCAP. - continue Abx  Status epilepticus. - per neurology  Coralyn Helling, MD [facility] Pulmonary/Critical Care 06/19/2016, 2:31 PM Pager:  [phone] After 3pm call: [phone]

## 2016-06-19 NOTE — Progress Notes (Signed)
[facility] FOR AM LAB REPLACEMENT ONLY  The patient does apply for the [facility] Electrolyte Replacment Protocol based on the criteria listed below:   1. Is GFR >/= 40 ml/min? Yes.    Patient's GFR today is >60 2. Is urine output >/= 0.5 ml/kg/hr for the last 6 hours? Yes.   Patient's UOP is 0.8 ml/kg/hr 3. Is BUN < 60 mg/dL? Yes.    Patient's BUN today is 13 4. Abnormal electrolyte(s): K 3.0 5. Ordered repletion with: per protocol 6. If a panic level lab has been reported, has the CCM MD in charge been notified? No..   Physician:    Markus DaftWHELAN, Harim Bi A 06/19/2016 6:21 AM

## 2016-06-19 NOTE — Progress Notes (Signed)
RT Note:  Pt placed back on full support at this time due to decreased pt effort, RR <8, RT will monitor

## 2016-06-20 DIAGNOSIS — R569 Unspecified convulsions: Secondary | ICD-10-CM

## 2016-06-20 LAB — BASIC METABOLIC PANEL
Anion gap: 4 — ABNORMAL LOW (ref 5–15)
BUN: 14 mg/dL (ref 6–20)
CALCIUM: 7.9 mg/dL — AB (ref 8.9–10.3)
CHLORIDE: 116 mmol/L — AB (ref 101–111)
CO2: 24 mmol/L (ref 22–32)
Creatinine, Ser: 0.9 mg/dL (ref 0.61–1.24)
GFR calc Af Amer: >60 mL/min (ref >60)
GFR calc non Af Amer: >60 mL/min (ref >60)
GLUCOSE: 105 mg/dL — AB (ref 65–99)
Potassium: 2.9 mmol/L — ABNORMAL LOW (ref 3.5–5.1)
SODIUM: 144 mmol/L (ref 135–145)

## 2016-06-20 LAB — CBC
HEMATOCRIT: 22.4 % — AB (ref 39.0–52.0)
HEMOGLOBIN: 7.5 g/dL — AB (ref 13.0–17.0)
MCH: 28.7 pg (ref 26.0–34.0)
MCHC: 33.5 g/dL (ref 30.0–36.0)
MCV: 85.8 fL (ref 78.0–100.0)
Platelets: 79 10*3/uL — ABNORMAL LOW (ref 150–400)
RBC: 2.61 MIL/uL — AB (ref 4.22–5.81)
RDW: 16.5 % — ABNORMAL HIGH (ref 11.5–15.5)
WBC: 13.3 10*3/uL — ABNORMAL HIGH (ref 4.0–10.5)

## 2016-06-20 LAB — GLUCOSE, CAPILLARY
GLUCOSE-CAPILLARY: 123 mg/dL — AB (ref 65–99)
GLUCOSE-CAPILLARY: 106 mg/dL — AB (ref 65–99)
GLUCOSE-CAPILLARY: 96 mg/dL (ref 65–99)
Glucose-Capillary: 111 mg/dL — ABNORMAL HIGH (ref 65–99)
Glucose-Capillary: 107 mg/dL — ABNORMAL HIGH (ref 65–99)
Glucose-Capillary: 112 mg/dL — ABNORMAL HIGH (ref 65–99)

## 2016-06-20 MED ORDER — POTASSIUM CHLORIDE 20 MEQ/15ML (10%) PO SOLN
40.0000 meq | Freq: Three times a day (TID) | ORAL | Status: AC
Start: 2016-06-20 — End: 2016-06-20
  Administered 2016-06-20 (×3): 40 meq
  Filled 2016-06-20 (×3): qty 30

## 2016-06-20 MED ORDER — FENTANYL CITRATE (PF) 100 MCG/2ML IJ SOLN
25.0000 ug | INTRAMUSCULAR | Status: DC | PRN
  Administered 2016-06-22 – 2016-06-24 (×4): 100 ug via INTRAVENOUS
  Filled 2016-06-20 (×5): qty 2

## 2016-06-20 NOTE — Progress Notes (Addendum)
Subjective: Continues to be encephalopathic.   Exam: Filed Vitals:   06/20/16 0825 06/20/16 0900  BP: 116/71 112/74  Pulse: 45 44  Temp:  96.8 F (36 C)  Resp: 14 14   Gen: In bed, intubated Resp: ventilated Abd: soft, nt  Neuro: MS: awakens to noxious stimuli, does appear to orient eyes to voice, and appears to fixate on me but does not track. Does not follow commands.  UE:AVWUJCN:PERRL, eyes cross midlien bilaterally, but then he tends to remain looking in on e direction until stimulus is applied.  Motor: withdraws all 4 extremitites, voluntary movements are present bilaterally Sensory:respodns to stim x 4.   Pertinent Labs: nml creatinine  Impression: 50 yo M with long course of recurrent AMS/seizures with a history of strep pneumo meningitis earlier this year. CT with question of new areas of injury that I think will need to be further evaluated with MRI.   Recommendations: 1) MRI brain  2) continue keppra, depakote 3) VPA level tomorrow AM.   Ritta SlotMcNeill Madlynn Lundeen, MD [facility] [phone]  If 7pm- 7am, please page neurology on call as listed in AMION.

## 2016-06-20 NOTE — Progress Notes (Signed)
PULMONARY / CRITICAL CARE MEDICINE   Name: Dale [name] MRN: [id number] DOB: 06/26/1966    ADMISSION DATE:  06/16/2016 CONSULTATION DATE:  06/16/2016  REFERRING MD:  EDP Dr Effie ShyWentz  CHIEF COMPLAINT:  AMS  HISTORY OF PRESENT ILLNESS:   50 year old male with complex medical history as outlined below. [facility] with complaints of AMS. His recent medical course over the past few months has been complicated as noted in discharge summary from Dr. Malachi BondsShort 06/01/16:  3/14-4/6: Admitted with strep pneumo meningitis, c/b cardiac arrest, and cardiogenic shock (EF 20%, recovered to 50%) briefly on amiodarone, ARDS requiring prolonged intubation and trach (now removed), ARF, shock liver, and adrenal insufficiency. He was discharged on 4/6 with weekly Pen G IM until 4/10.  4/16-5/1: Readmitted with fall, altered mental status. Repeat LP had no growth, TEE without vegetation, ultimately transitioned to ceftriaxone 2g BID with plans for >4 weeks IV antibiotics. During this hospitalization, he was planning for SNF placement until the day of discharge when the patient's brother asked to take him home (it appears that this wasn't verified with the patient's estranged but legally married wife).  Since discharge, patient has returned to the ED 4 times because of loss of PICC line, PICC requiring to be flushed, PICC coming out, or patient wandering unattended in the street (he has dementia since his meningitis).  5/24-5/26: Readmitted for placement to SNF, given additional week of rocephin and was supposed to follow up with ID before stopping rocephin.  6/15-6/28: admit for occult sepsis and seizures, no source identified, postulated to be aspiration PNA related. He was treated with broad spectrum ABX, cultures were negative and he was returned to SNF. He never followed up with ID for meningitis ABX recommendations. He was seen by them inpatient who recommended they be stopped.    7/13 again presenting with AMS thought to be septic/PNA in origin but had witnessed seizure in ED. He was found to be hypotensive and hypothermic, however WBC and Lactic were wnl. CXR with LLL opacification He required CVL placement and vasopressors for shock despite aggressive IVF resuscitation.  He will need ICU admission for shock treatment and continuous EEG. He was intubated in the ED for airway protection and [facility]. At tiime of pccm MD eval - intubated, has femoral line and on levophed    SUBJECTIVE: No sig change.  Off levophed.  Bradycardic.    REVIEW OF SYSTEMS:  Unable to obtain given intubation.  VITAL SIGNS: BP 116/71 mmHg  Pulse 45  Temp(Src) 96.8 F (36 C) (Core (Comment))  Resp 14  Ht 5\' 8"  (1.727 m)  Wt 67.4 kg (148 lb 9.4 oz)  BMI 22.60 kg/m2  SpO2 100%  HEMODYNAMICS: CVP:  [6 mmHg] 6 mmHg  VENTILATOR SETTINGS: Vent Mode:  [-] PRVC FiO2 (%):  [30 %-40 %] 30 % Set Rate:  [14 bmp] 14 bmp Vt Set:  [620 mL] 620 mL PEEP:  [5 cmH20] 5 cmH20 Pressure Support:  [5 cmH20] 5 cmH20 Plateau Pressure:  [16 cmH20-20 cmH20] 17 cmH20  INTAKE / OUTPUT: I/O last 3 completed shifts: In: 6032.2 [I.V.:2677.2; NG/GT:1980; IV Piggyback:1375] Out: 1445 [Urine:1445]  PHYSICAL EXAMINATION: General:  Critically and chronically ill looking male.  On ventilator, NAD  Neuro:  RASS -2 equivalent . Off all sedation.  Opens eyes to pain, withdraws to pain, blinks to threat. Does not follow commands, does not track.  HEENT:  Intubated. OGT, PERL  pinpoint Cardiovascular:  Normal heart sounds Lungs:  resps even non labored on vent, diminished bases otherwise clear Abdomen:  soft Musculoskeletal:  No cyanosis. No clubbing   LABS: PULMONARY  Recent Labs Lab 06/16/16 1922 06/16/16 2243  PHART 7.378 7.482*  PCO2ART 38.4 24.7*  PO2ART 82.7 199*  HCO3 23.5 18.7*  TCO2 22.8 17.3  O2SAT 97.0 99.5    CBC  Recent Labs Lab 06/18/16 0540 06/19/16 0400  06/20/16 0508  HGB 8.6* 8.1* 7.5*  HCT 26.3* 24.3* 22.4*  WBC 13.5* 11.1* 13.3*  PLT 141* 96* 79*    COAGULATION  Recent Labs Lab 06/17/16 0459  INR 1.20    CARDIAC    Recent Labs Lab 06/16/16 2243 06/17/16 0557 06/17/16 2240 06/18/16 0540  TROPONINI <0.03 <0.03 <0.03 <0.03   No results for input(s): PROBNP in the last 168 hours.   CHEMISTRY  Recent Labs Lab 06/16/16 1758 06/16/16 2243  06/17/16 0459 06/18/16 0540 06/18/16 0900 06/18/16 1650 06/19/16 0400 06/19/16 1700 06/20/16 0508  NA 144  --   --  141 146*  --   --  144  --  144  K 4.9  --   --  4.0 3.9  --   --  3.0*  --  2.9*  CL 112*  --   --  117* 114*  --   --  115*  --  116*  CO2 26  --   --  20* 22  --   --  21*  --  24  GLUCOSE 80  --   --  124* 98  --   --  97  --  105*  BUN 14  --   --  10 8  --   --  13  --  14  CREATININE 0.76 0.83  --  1.02 1.30*  --   --  1.01  --  0.90  CALCIUM 8.6*  --   --  7.9* 8.6*  --   --  8.3*  --  7.9*  MG  --   --   < > 1.3* 1.3* 1.3* 1.3* 1.9 1.9  --   PHOS  --   --   < > 1.6* 4.7* 3.8 3.0 3.6 2.7  --   < > = values in this interval not displayed. Estimated Creatinine Clearance: 92.6 mL/min (by C-G formula based on Cr of 0.9).   LIVER  Recent Labs Lab 06/16/16 1758 06/17/16 0459  AST 23  --   ALT 23  --   ALKPHOS 59  --   BILITOT 0.6  --   PROT 5.2*  --   ALBUMIN 2.6*  --   INR  --  1.20     INFECTIOUS  Recent Labs Lab 06/16/16 1806 06/16/16 2110  06/17/16 0459 06/18/16 0540 06/19/16 0400  LATICACIDVEN 0.73 0.97  --   --   --   --   PROCALCITON  --   --   < > 0.23 0.16 0.10  < > = values in this interval not displayed.   ENDOCRINE CBG (last 3)   Recent Labs  06/19/16 2346 06/20/16 0459 06/20/16 0826  GLUCAP 111* 111* 123*     Dg Chest Portable 1 View  06/19/2016  CLINICAL DATA:  Respiratory failure EXAM: PORTABLE CHEST 1 VIEW COMPARISON:  06/18/2016 FINDINGS: ET tube tip is above the carina. There is a left IJ catheter with  tip in the projection of the SVC. Enteric tube tip is below the hemidiaphragm.  Persistent opacity within the left lung base. Right lung is clear. IMPRESSION: 1. Stable position of the support apparatus. 2. No change in left base opacity. Electronically Signed   By: Signa Kell M.D.   On: 06/19/2016 07:44   EVENTS: 7/13 - Admit w/ septic shock & seizure  MICROBIOLOGY: Blood Ctx x2 7/13>>> Urine Ctx 7/13>>> MRSA PCR 7/14:  Negative  ANTIBIOTICS: Vancomycin 7/13 - 7/17 Zosyn 7/13 >  STUDIES:  CT head 7/14: New areas of subacute to chronic ischemia on the left. Progression in the degree of sinusitis as described. EEG 7/14: This was an abnormal EEG due to the presence of moderate background slowing. This slowing is non-specific and is indicative of encephalopathy. Port CXR 7/16:  L IJ CVL with tip over SVC. Enteric tube below hemidiaphragm. ETT above carina.   LINES/TUBES: OETT 7.5 7/13>> L IJ CVL 07/13>> OGT 7/13>> Foley 7/13>> PIV x2  DISCUSSION: 50 y.o. M with complex PMH including recent admissions for strep pneumo meningitis c/b cardiac arrest and cardiogenic shock (March 2017).  Had trach at the time but has since been decannulated.  Now resides at [facility] and was admitted again 07/13 with septic shock shock due to PNA as well as seizures.  He required intubation in ED for airway protection.   ASSESSMENT / PLAN:  NEUROLOGIC A:   Acute Encephalopathy. Seizures. New Subacute Ischemia - Seen on CT scan. H/O strep pneumo meningitis, hydrocephalus, CVA.  P:   RASS goal: 0  Daily WUA AED per Neuro:  Keppra & Depacon Ativan IV prn seizure Neurology Following & appreciate recommendations Thiamine & FA VT daily MRI pending  INFECTIOUS A:   Sepsis - Possible HCAP Possible HCAP H/O strep pneumo meningitis (march 2017).  P:   Empiric Vancomycin & Zosyn Day # 5 D/C Vancomycin Following cultures to completion May need LP  PULMONARY A: Acute Hypoxic Respiratory  Failure Unable to Protect Airway - In setting of seizures. HCAP H/O Tracheostomy March 2017 - S/P Decannulation.  P:   Continue full vent support. Daily SBT Suspect will need re-do trach  Abx / cultures per ID section. Intermittent CXR & ABG  CARDIOVASCULAR A:  Shock - Resolved. Possibly secondary to sepsis. Bradycardia H/O Cardiac Arrest w/ NSTEMI H/O Systolic CHF - EF 45 - 50% per echo from June 2017 H/O Hyperlipidemia  P:  Continue Telemetry monitoring  Vitals per Unit Protocol Continue outpatient ASA & atorvastatin.  RENAL A:   Hypokalemia - Replacing. Hyperchloremia - Mild. Metabolic Acidosis - Mild. Likely secondary to hyperchloremia. Pseudohypocalcemia - corrects to 9.0 7/14 Hypophosphoremia  - Resolved. Hypomagnesemia - Resolved.  P:   Trending UOP  D/C Foley Monitoring electrolytes & renal function daily Replacing electrolytes as indicated D5 w/ 1/2NS  KCl VT q8hr  GASTROINTESTINAL A:   No acute issues.  P:   Protonix VT daily NPO. Continuing tube feedings  HEMATOLOGIC A:   Anemia - Chronic. No signs of active bleeding.  P:  Transfuse for Hgb < 7. SCD's  Trending cell counts daily w/ CBC  ENDOCRINE A:   Risk of Hyperglycemia - On stress steroids. Adrenal Insufficiency - Cortisol 4.3.  P:   D/C Stress dose steroids SSI per Standard Algorithm Accu-Checks q4hr  FAMILY  - Updates: none at bedside 7/17  - Inter-disciplinary family meet or Palliative Care meeting due by:  06/23/16   Dirk Dress, NP 06/20/2016  8:58 AM Pager: [phone] or [phone]   PCCM Attending Note: Patient seen and examined  with nurse practitioner. Please refer to her progress at which I reviewed in detail. Bradycardia of unclear significance. Blood pressure is stabilized therefore discontinuing stress dosed steroids. Cultures remain negative to date with negative MRSA PCR swab therefore discontinuing vancomycin. MRI pending. Appreciate  neurology recommendations.  I have spent a total of 34 minutes of critical care time today caring for the patient and reviewing the patient's electronic medical record.  Donna Christen Jamison Neighbor, M.D. [facility] Pulmonary & Critical Care Pager:  [phone] After 3pm or if no response, call [phone] 1:02 PM 06/20/2016

## 2016-06-20 NOTE — Progress Notes (Signed)
eLink Physician-Brief Progress Note Patient Name: Dale HollowCraig D Whetsel DOB: 11/09/1966 MRN: [id number]   Date of Service  06/20/2016  HPI/Events of Note  Starting to wake up and be agitated. Will not get good MRI study without some sedation.   eICU Interventions  Will order: 1. Fentanyl 25-100 mcg IV Q 2 hours PRN.     Intervention Category Minor Interventions: Agitation / anxiety - evaluation and management  Lenell AntuSommer,Ramonte Mena Eugene 06/20/2016, 10:54 PM

## 2016-06-21 ENCOUNTER — Inpatient Hospital Stay (HOSPITAL_COMMUNITY): Payer: Medicaid Other

## 2016-06-21 LAB — BASIC METABOLIC PANEL
Anion gap: 6 (ref 5–15)
Anion gap: 4 — ABNORMAL LOW (ref 5–15)
BUN: 13 mg/dL (ref 6–20)
BUN: 12 mg/dL (ref 6–20)
CALCIUM: 7.7 mg/dL — AB (ref 8.9–10.3)
CHLORIDE: 116 mmol/L — AB (ref 101–111)
CHLORIDE: 117 mmol/L — AB (ref 101–111)
CO2: 24 mmol/L (ref 22–32)
CO2: 25 mmol/L (ref 22–32)
CREATININE: 0.82 mg/dL (ref 0.61–1.24)
CREATININE: 1.05 mg/dL (ref 0.61–1.24)
Calcium: 8.2 mg/dL — ABNORMAL LOW (ref 8.9–10.3)
GFR calc non Af Amer: >60 mL/min (ref >60)
GFR calc non Af Amer: >60 mL/min (ref >60)
Glucose, Bld: 107 mg/dL — ABNORMAL HIGH (ref 65–99)
Glucose, Bld: 80 mg/dL (ref 65–99)
Potassium: 2.9 mmol/L — ABNORMAL LOW (ref 3.5–5.1)
Potassium: 3.5 mmol/L (ref 3.5–5.1)
SODIUM: 146 mmol/L — AB (ref 135–145)
Sodium: 146 mmol/L — ABNORMAL HIGH (ref 135–145)

## 2016-06-21 LAB — GLUCOSE, CAPILLARY
GLUCOSE-CAPILLARY: 124 mg/dL — AB (ref 65–99)
GLUCOSE-CAPILLARY: 68 mg/dL (ref 65–99)
GLUCOSE-CAPILLARY: 72 mg/dL (ref 65–99)
GLUCOSE-CAPILLARY: 97 mg/dL (ref 65–99)
Glucose-Capillary: 92 mg/dL (ref 65–99)
Glucose-Capillary: 79 mg/dL (ref 65–99)
Glucose-Capillary: 77 mg/dL (ref 65–99)
Glucose-Capillary: 72 mg/dL (ref 65–99)

## 2016-06-21 LAB — VALPROIC ACID LEVEL: Valproic Acid Lvl: 59 ug/mL (ref 50.0–100.0)

## 2016-06-21 LAB — CULTURE, BLOOD (ROUTINE X 2)
CULTURE: NO GROWTH
Culture: NO GROWTH

## 2016-06-21 LAB — MAGNESIUM: Magnesium: 1.9 mg/dL (ref 1.7–2.4)

## 2016-06-21 LAB — PHOSPHORUS: Phosphorus: 1.9 mg/dL — ABNORMAL LOW (ref 2.5–4.6)

## 2016-06-21 MED ORDER — DEXTROSE 50 % IV SOLN
INTRAVENOUS | Status: AC
  Administered 2016-06-21: 50 mL
  Filled 2016-06-21: qty 50

## 2016-06-21 MED ORDER — POTASSIUM PHOSPHATES 15 MMOLE/5ML IV SOLN
30.0000 mmol | Freq: Once | INTRAVENOUS | Status: AC
  Administered 2016-06-21: 30 mmol via INTRAVENOUS
  Filled 2016-06-21: qty 10

## 2016-06-21 MED ORDER — TAMSULOSIN HCL 0.4 MG PO CAPS
0.4000 mg | ORAL_CAPSULE | Freq: Every day | ORAL | Status: DC
  Filled 2016-06-21: qty 1

## 2016-06-21 MED ORDER — BETHANECHOL CHLORIDE 5 MG PO TABS
5.0000 mg | ORAL_TABLET | Freq: Three times a day (TID) | ORAL | Status: DC
  Administered 2016-06-21 – 2016-06-22 (×3): 5 mg
  Filled 2016-06-21 (×5): qty 1

## 2016-06-21 MED ORDER — GADOBENATE DIMEGLUMINE 529 MG/ML IV SOLN
15.0000 mL | Freq: Once | INTRAVENOUS | Status: AC | PRN
  Administered 2016-06-21: 14 mL via INTRAVENOUS

## 2016-06-21 MED ORDER — SODIUM CHLORIDE 0.9 % IV BOLUS (SEPSIS)
500.0000 mL | Freq: Once | INTRAVENOUS | Status: AC
  Administered 2016-06-21: 500 mL via INTRAVENOUS

## 2016-06-21 MED ORDER — ATROPINE SULFATE 1 MG/10ML IJ SOSY
PREFILLED_SYRINGE | INTRAMUSCULAR | Status: AC
  Filled 2016-06-21: qty 10

## 2016-06-21 MED ORDER — DEXTROSE 5 % IV SOLN
2.0000 ug/min | INTRAVENOUS | Status: DC
  Administered 2016-06-21: 2 ug/min via INTRAVENOUS
  Filled 2016-06-21: qty 4

## 2016-06-21 NOTE — Progress Notes (Signed)
PCCM INTERVAL PROGRESS NOTE   Mr [name] is hypotensive again 75/53. CVP 10, 10L positive for the admission, peripheral edema/anasarca  Will restart levophed   Joneen RoachPaul Hoffman, AGACNP-BC [facility] Pulmonology/Critical Care Pager [phone] or [phone]  06/21/2016 9:54 PM

## 2016-06-21 NOTE — Progress Notes (Signed)
PULMONARY / CRITICAL CARE MEDICINE   Name: Dale [name] MRN: [id number] DOB: 09/04/1966    ADMISSION DATE:  06/16/2016 CONSULTATION DATE:  06/16/2016  REFERRING MD:  EDP Dr Effie ShyWentz  CHIEF COMPLAINT:  AMS  HISTORY OF PRESENT ILLNESS:   50 year old male with complex medical history as outlined below. [facility]13 with complaints of AMS. His recent medical course over the past few months has been complicated as noted in discharge summary from Dr. Malachi BondsShort 06/01/16:  3/14-4/6: Admitted with strep pneumo meningitis, c/b cardiac arrest, and cardiogenic shock (EF 20%, recovered to 50%) briefly on amiodarone, ARDS requiring prolonged intubation and trach (now removed), ARF, shock liver, and adrenal insufficiency. He was discharged on 4/6 with weekly Pen G IM until 4/10.  4/16-5/1: Readmitted with fall, altered mental status. Repeat LP had no growth, TEE without vegetation, ultimately transitioned to ceftriaxone 2g BID with plans for >4 weeks IV antibiotics. During this hospitalization, he was planning for SNF placement until the day of discharge when the patient's brother asked to take him home (it appears that this wasn't verified with the patient's estranged but legally married wife).  Since discharge, patient has returned to the ED 4 times because of loss of PICC line, PICC requiring to be flushed, PICC coming out, or patient wandering unattended in the street (he has dementia since his meningitis).  5/24-5/26: Readmitted for placement to SNF, given additional week of rocephin and was supposed to follow up with ID before stopping rocephin.  6/15-6/28: admit for occult sepsis and seizures, no source identified, postulated to be aspiration PNA related. He was treated with broad spectrum ABX, cultures were negative and he was returned to SNF. He never followed up with ID for meningitis ABX recommendations. He was seen by them inpatient who recommended they be stopped.    7/13 again presenting with AMS thought to be septic/PNA in origin but had witnessed seizure in ED. He was found to be hypotensive and hypothermic, however WBC and Lactic were wnl. CXR with LLL opacification He required CVL placement and vasopressors for shock despite aggressive IVF resuscitation.  He will need ICU admission for shock treatment and continuous EEG. He was intubated in the ED for airway protection and transferred to [facility]. At tiime of pccm MD eval - intubated, has femoral line and on levophed    SUBJECTIVE: No sig change.  Off levophed.  Heart rate 65.    REVIEW OF SYSTEMS:  Unable to obtain given intubation.  VITAL SIGNS: BP 91/57 mmHg  Pulse 53  Temp(Src) 97.4 F (36.3 C) (Axillary)  Resp 14  Ht 5\' 8"  (1.727 m)  Wt 150 lb 9.2 oz (68.3 kg)  BMI 22.90 kg/m2  SpO2 98%  HEMODYNAMICS: CVP:  [6 mmHg-50 mmHg] 7 mmHg  VENTILATOR SETTINGS: Vent Mode:  [-] PRVC FiO2 (%):  [30 %] 30 % Set Rate:  [14 bmp] 14 bmp Vt Set:  [620 mL] 620 mL PEEP:  [5 cmH20] 5 cmH20 Pressure Support:  [5 cmH20] 5 cmH20 Plateau Pressure:  [17 cmH20-19 cmH20] 17 cmH20  INTAKE / OUTPUT: I/O last 3 completed shifts: In: 5872.5 [I.V.:2700; NG/GT:2012.5; IV Piggyback:1160] Out: 2114 [Urine:1774; Stool:340]  PHYSICAL EXAMINATION: General:  Critically and chronically ill looking male.  On ventilator, NAD  Neuro:  RASS -2 equivalent . Off all sedation.  Opens eyes to pain, withdraws to pain, blinks to threat. Does not follow commands, does not track.  HEENT:  Intubated.  OGT, PERL pinpoint Cardiovascular:  Normal heart sounds Lungs:  resps even non labored on vent, diminished bases otherwise coarse Abdomen:  soft Musculoskeletal:  No cyanosis. No clubbing   LABS: PULMONARY  Recent Labs Lab 06/16/16 1922 06/16/16 2243  PHART 7.378 7.482*  PCO2ART 38.4 24.7*  PO2ART 82.7 199*  HCO3 23.5 18.7*  TCO2 22.8 17.3  O2SAT 97.0 99.5    CBC  Recent Labs Lab 06/18/16 0540  06/19/16 0400 06/20/16 0508  HGB 8.6* 8.1* 7.5*  HCT 26.3* 24.3* 22.4*  WBC 13.5* 11.1* 13.3*  PLT 141* 96* 79*    COAGULATION  Recent Labs Lab 06/17/16 0459  INR 1.20    CARDIAC    Recent Labs Lab 06/16/16 2243 06/17/16 0557 06/17/16 2240 06/18/16 0540  TROPONINI <0.03 <0.03 <0.03 <0.03   No results for input(s): PROBNP in the last 168 hours.   CHEMISTRY  Recent Labs Lab 06/17/16 0459 06/18/16 0540 06/18/16 0900 06/18/16 1650 06/19/16 0400 06/19/16 1700 06/20/16 0508 06/21/16 0530  NA 141 146*  --   --  144  --  144 146*  K 4.0 3.9  --   --  3.0*  --  2.9* 2.9*  CL 117* 114*  --   --  115*  --  116* 116*  CO2 20* 22  --   --  21*  --  24 24  GLUCOSE 124* 98  --   --  97  --  105* 107*  BUN 10 8  --   --  13  --  14 13  CREATININE 1.02 1.30*  --   --  1.01  --  0.90 0.82  CALCIUM 7.9* 8.6*  --   --  8.3*  --  7.9* 8.2*  MG 1.3* 1.3* 1.3* 1.3* 1.9 1.9  --  1.9  PHOS 1.6* 4.7* 3.8 3.0 3.6 2.7  --  1.9*   Estimated Creatinine Clearance: 103 mL/min (by C-G formula based on Cr of 0.82).   LIVER  Recent Labs Lab 06/16/16 1758 06/17/16 0459  AST 23  --   ALT 23  --   ALKPHOS 59  --   BILITOT 0.6  --   PROT 5.2*  --   ALBUMIN 2.6*  --   INR  --  1.20     INFECTIOUS  Recent Labs Lab 06/16/16 1806 06/16/16 2110  06/17/16 0459 06/18/16 0540 06/19/16 0400  LATICACIDVEN 0.73 0.97  --   --   --   --   PROCALCITON  --   --   < > 0.23 0.16 0.10  < > = values in this interval not displayed.   ENDOCRINE CBG (last 3)   Recent Labs  06/21/16 0404 06/21/16 0520 06/21/16 0740  GLUCAP 68 124* 92     Mr Brain W Wo Contrast  06/21/2016  CLINICAL DATA:  Initial evaluation for recurrent altered mental status, seizures, history of strep pneumo meningitis. Question new areas of and within the left basal ganglia. EXAM: MRI HEAD WITHOUT AND WITH CONTRAST TECHNIQUE: Multiplanar, multiecho pulse sequences of the brain and surrounding structures were  obtained without and with intravenous contrast. CONTRAST:  14mL MULTIHANCE GADOBENATE DIMEGLUMINE 529 MG/ML IV SOLN COMPARISON:  Comparison made with prior CT from 06/17/2016 as well as previous MRI from 03/24/2016. FINDINGS: Diffuse prominence of the CSF containing spaces compatible with generalized cerebral atrophy. There has been normal interval expected evolution of previously identified acute ischemic infarct involving the left corona radiata/basal ganglia, which now demonstrates progressive  encephalomalacia without significant diffusion abnormality. Question minimal faint enhancement within this region extending towards the left cerebral peduncle, likely related to the late subacute ischemia (series 11, image 28). Additional remote infarctions involving the bilateral basal ganglia again seen. Chronic blood products again noted within the left external capsule. No new areas of restricted diffusion to suggest new ischemia or injury. Similar FLAIR signal abnormality at the right parietal cortex. Associated subtle laminar necrosis within this region (series 3, image 6). No significant enhancement now seen within this region. Additional focus of cortical flares abnormality within the right frontal lobe slightly more prominent as compared to previous exam, although this appears to be chronic in nature. No appreciable enhancement within these areas. No other abnormal enhancement elsewhere within the brain. Major intracranial vascular flow voids are preserved. No mass lesion, mass effect, or midline shift. Layering susceptibility artifact within the stent was of both lateral ventricles, likely debris/chronic hemorrhage related to prior meningitis, similar to prior. No hydrocephalus. Craniocervical junction within normal limits. Visualized upper cervical spine unremarkable. Pituitary gland normal. No acute abnormality about the globes and orbits. Extensive opacity throughout the paranasal sinuses with fluid levels  within the sphenoid and maxillary sinuses. Bilateral mastoid effusions. Fluid within the nasopharynx. Patient likely intubated. Bone marrow signal intensity within normal limits. No scalp soft tissue abnormality. IMPRESSION: 1. Normal expected interval evolution of previously identified infarct involving the left basal ganglia/corona radiata. Additionally, previously seen signal abnormalities within the bilateral basal ganglia have also largely resolved relative to previous MRI from 03/24/2016. Similarly, changes at the right parietal cortex have also evolved. No new areas of ischemia or injury identified. 2. Persistent layering debris/hemorrhage within the lateral ventricles, likely related to recent history of meningitis. No hydrocephalus. Electronically Signed   By: Rise Mu M.D.   On: 06/21/2016 04:55   EVENTS: 7/13 - Admit w/ septic shock & seizure  MICROBIOLOGY: Blood Ctx x2 7/13>>> Urine Ctx 7/13>>> No Growth MRSA PCR 7/14:  Negative  ANTIBIOTICS: Vancomycin 7/13 - 7/17 Zosyn 7/13 >  STUDIES:  CT head 7/14: New areas of subacute to chronic ischemia on the left. Progression in the degree of sinusitis as described. EEG 7/14: This was an abnormal EEG due to the presence of moderate background slowing. This slowing is non-specific and is indicative of encephalopathy. Port CXR 7/16:  L IJ CVL with tip over SVC. Enteric tube below hemidiaphragm. ETT above carina.  MRI  7/18: IMPRESSION: 1. Normal expected interval evolution of previously identified infarct involving the left basal ganglia/corona radiata. Additionally, previously seen signal abnormalities within the bilateral basal ganglia have also largely resolved relative to previous MRI from 03/24/2016. Similarly, changes at the right parietal cortex have also evolved. No new areas of ischemia or injury identified. 2. Persistent layering debris/hemorrhage within the lateral ventricles, likely related to recent history of  meningitis. No Hydrocephalus.   LINES/TUBES: OETT 7.5 7/13>> L IJ CVL 07/13>> OGT 7/13>> Foley 7/13>> PIV x2  DISCUSSION: 50 y.o. M with complex PMH including recent admissions for strep pneumo meningitis c/b cardiac arrest and cardiogenic shock (March 2017).  Had trach at the time but has since been decannulated.  Now resides at [facility] and was admitted again 07/13 with septic shock shock due to PNA as well as seizures.  He required intubation in ED for airway protection.   ASSESSMENT / PLAN:  NEUROLOGIC A:   Acute Encephalopathy. Seizures. New Subacute Ischemia - Seen on CT scan. H/O strep pneumo meningitis, hydrocephalus, CVA. Valproic Acid Level  59 ug/mL P:   RASS goal: 0  Daily WUA AED per Neuro:  Keppra & Depacon Ativan IV prn seizure Neurology Following & appreciate recommendations Thiamine & FA VT daily MRI results as above  INFECTIOUS A:   Sepsis - Possible HCAP Possible HCAP H/O strep pneumo meningitis (march 2017).  P:   Empiric Vancomycin & Zosyn Day # 6 D/C Vancomycin Following cultures to completion Consider LP Trend CBC/ Fever Curve  PULMONARY A: Acute Hypoxic Respiratory Failure Unable to Protect Airway - In setting of seizures. HCAP H/O Tracheostomy March 2017 - S/P Decannulation. No patient effort with daily SBT's  P:   Continue full vent support. Daily SBT Suspect will need re-do trach  Abx / cultures per ID section. Intermittent CXR & ABG  CARDIOVASCULAR A:  Shock - Resolved. Possibly secondary to sepsis. Bradycardia resolving H/O Cardiac Arrest w/ NSTEMI H/O Systolic CHF - EF 45 - 50% per echo from June 2017 H/O Hyperlipidemia  P:  Continue Telemetry monitoring  Goal MAP 65 Vitals per Unit Protocol Continue outpatient ASA & atorvastatin.  RENAL A:   Hypokalemia - Replacing. Hyperchloremia - Mild. Metabolic Acidosis - Mild. Likely secondary to hyperchloremia. Pseudohypocalcemia - corrects to 9.0 7/14 Hypophosphoremia  -  replacing . Hypomagnesemia - Resolved. I&O Cath Q 8 per protocol x 24 more hours then re-evaluate   P:   Trending UOP  D/C Foley Monitoring electrolytes & renal function daily Replacing electrolytes as indicated D5 w/ 1/2NS @75  K Phos 30 mm repletion BMET 1700  GASTROINTESTINAL A:   No acute issues.  P:   Protonix VT daily NPO. TF @55  per OGT  HEMATOLOGIC A:   Anemia - Chronic. No signs of active bleeding.  P:  Transfuse for Hgb < 7. SCD's  Trending cell counts daily w/ CBC  ENDOCRINE A:   Risk of Hyperglycemia - On stress steroids. Adrenal Insufficiency - Cortisol 4.3.  P:   D/C Stress dose steroids SSI per Standard Algorithm Accu-Checks q4hr  FAMILY  - Updates: none at bedside 7/18  - Inter-disciplinary family meet or Palliative Care meeting due by:  06/23/16   Bevelyn Ngo, AGACNP-BC [facility] 06/21/2016  8:57 AM Pager: [phone]   Attending note: I have seen and examined the patient with nurse practitioner/resident and agree with the note. History, labs and imaging reviewed.  50 Y/O with multiple medical problems. Repeated admission with altered mental status, meningitis, PNA. Now readmitted with AMS, sepsis, presumed PNA MRI reviewed > No acute changes.  Continue supportive care.  Wake up and breath assessments Continue antibiotics Retart flomax for urinary retention. He may need the foley to be reinserted.  Rest of plan as above.  Chilton Greathouse MD  Pulmonary and Critical Care Pager [phone] If no answer or after 3pm call: [phone] 06/21/2016, 10:08 AM

## 2016-06-21 NOTE — Progress Notes (Signed)
0246 In MRI with patient. Called Elink to speak with MD regarding patient HR sustaining in 30's. Pt BP stable. Pt awake with eyes open and appears to be asymptomatic. Orders given to hold off on atropine unless pt becomes unstable.

## 2016-06-21 NOTE — Progress Notes (Signed)
Transported patient to MRI while patient was on the ventilator. Placed patient on MRI vent before MRI scan.

## 2016-06-21 NOTE — Progress Notes (Signed)
Subjective: Slightly more awake today  Exam: Filed Vitals:   06/21/16 1800 06/21/16 1900  BP: 95/64 85/56  Pulse:  56  Temp:    Resp: 14 14   Gen: In bed, intubated Resp: ventilated Abd: soft, nt  Neuro: MS: He appears to look at me, he follows commands to stick out his tongue but not to wiggle his toes. NW:GNFAOCN:PERRL, eyes cross midline bilaterally Motor: withdraws all 4 extremitites Sensory:responds to stim x 4.   Pertinent Labs: nml creatinine  Impression: 50 yo M with long course of recurrent AMS/seizures with a history of strep pneumo meningitis earlier this year. He has been slow to wake up, but does seem to be showing some signs of improvement.  Recommendations: 1) continue keppra, depakote   Ritta SlotMcNeill Trysten Bernard, MD [facility] [phone]  If 7pm- 7am, please page neurology on call as listed in AMION.

## 2016-06-22 ENCOUNTER — Inpatient Hospital Stay (HOSPITAL_COMMUNITY): Payer: Medicaid Other

## 2016-06-22 DIAGNOSIS — E876 Hypokalemia: Secondary | ICD-10-CM

## 2016-06-22 LAB — GLUCOSE, CAPILLARY
GLUCOSE-CAPILLARY: 79 mg/dL (ref 65–99)
GLUCOSE-CAPILLARY: 95 mg/dL (ref 65–99)
Glucose-Capillary: 107 mg/dL — ABNORMAL HIGH (ref 65–99)
Glucose-Capillary: 122 mg/dL — ABNORMAL HIGH (ref 65–99)
Glucose-Capillary: 69 mg/dL (ref 65–99)
Glucose-Capillary: 76 mg/dL (ref 65–99)
Glucose-Capillary: 84 mg/dL (ref 65–99)

## 2016-06-22 LAB — BASIC METABOLIC PANEL
ANION GAP: 7 (ref 5–15)
BUN: 14 mg/dL (ref 6–20)
CALCIUM: 7.9 mg/dL — AB (ref 8.9–10.3)
CO2: 24 mmol/L (ref 22–32)
Chloride: 115 mmol/L — ABNORMAL HIGH (ref 101–111)
Creatinine, Ser: 1.03 mg/dL (ref 0.61–1.24)
Glucose, Bld: 88 mg/dL (ref 65–99)
POTASSIUM: 3.1 mmol/L — AB (ref 3.5–5.1)
SODIUM: 146 mmol/L — AB (ref 135–145)

## 2016-06-22 LAB — CBC WITH DIFFERENTIAL/PLATELET
BASOS ABS: 0 10*3/uL (ref 0.0–0.1)
BASOS PCT: 0 %
EOS ABS: 0.1 10*3/uL (ref 0.0–0.7)
EOS PCT: 1 %
HCT: 21.5 % — ABNORMAL LOW (ref 39.0–52.0)
Hemoglobin: 7.2 g/dL — ABNORMAL LOW (ref 13.0–17.0)
LYMPHS PCT: 30 %
Lymphs Abs: 2.5 10*3/uL (ref 0.7–4.0)
MCH: 28.7 pg (ref 26.0–34.0)
MCHC: 33.5 g/dL (ref 30.0–36.0)
MCV: 85.7 fL (ref 78.0–100.0)
Monocytes Absolute: 0.7 10*3/uL (ref 0.1–1.0)
Monocytes Relative: 9 %
Neutro Abs: 5 10*3/uL (ref 1.7–7.7)
Neutrophils Relative %: 60 %
PLATELETS: 87 10*3/uL — AB (ref 150–400)
RBC: 2.51 MIL/uL — AB (ref 4.22–5.81)
RDW: 16.8 % — AB (ref 11.5–15.5)
WBC: 8.3 10*3/uL (ref 4.0–10.5)

## 2016-06-22 MED ORDER — HYDROCORTISONE NA SUCCINATE PF 100 MG IJ SOLR
50.0000 mg | Freq: Two times a day (BID) | INTRAMUSCULAR | Status: DC
  Administered 2016-06-22 – 2016-06-23 (×3): 50 mg via INTRAVENOUS
  Filled 2016-06-22 (×3): qty 2

## 2016-06-22 MED ORDER — BETHANECHOL CHLORIDE 10 MG PO TABS
10.0000 mg | ORAL_TABLET | Freq: Three times a day (TID) | ORAL | Status: DC
  Administered 2016-06-22 – 2016-06-24 (×6): 10 mg
  Filled 2016-06-22 (×7): qty 1

## 2016-06-22 MED ORDER — POTASSIUM CHLORIDE 20 MEQ/15ML (10%) PO SOLN
30.0000 meq | ORAL | Status: AC
  Administered 2016-06-22 (×2): 30 meq
  Filled 2016-06-22 (×2): qty 30

## 2016-06-22 NOTE — Progress Notes (Signed)
[facility]OTOCOL FOR AM LAB REPLACEMENT ONLY  The patient does apply for the [facility] Electrolyte Replacment Protocol based on the criteria listed below:   1. Is GFR >/= 40 ml/min? Yes.    Patient's GFR today is >60 2. Is urine output >/= 0.5 ml/kg/hr for the last 6 hours? Yes.   Patient's UOP is 1.48 ml/kg/hr 3. Is BUN < 60 mg/dL? Yes.    Patient's BUN today is 14 4. Abnormal electrolyte(s):  K - 3.1 5. Ordered repletion with: PER PROTOCOL 6. If a panic level lab has been reported, has the CCM MD in charge been notified? Yes.  .   Physician:  Dr. Minerva FesterMcQuaid  Dale [name] Old 06/22/2016 6:23 AM

## 2016-06-22 NOTE — Progress Notes (Signed)
vLTM EEG running event button tested notified neuro °

## 2016-06-22 NOTE — Progress Notes (Signed)
Subjective: He was much more awake for me, but apparently has been waxing/waning.   Exam: Filed Vitals:   06/22/16 1200 06/22/16 1300  BP: 97/70 104/68  Pulse:    Temp:    Resp: 14 14   Gen: In bed, intubated Resp: ventilated Abd: soft, nt  Neuro: MS: He follows commands with only mild delay. Marland Kitchen. WJ:XBJYNCN:PERRL, eyes cross midline bilaterally Motor: f/c in all 4 ext.  Sensory:responds to stim x 4.   Impression: 50 yo M with long course of recurrent AMS/seizures with a history of strep pneumo meningitis earlier this year. He has been slow to wake up, but does seem to be improving. With waxing/waning MS, I am concerned he may be having recurrent seizures, will get prolonged EEG to evaluate.   Recommendations: 1) continue keppra, depakote 2) continuous EEG 3) will follow.    Ritta SlotMcNeill Shawnika Pepin, MD [facility] [phone]  If 7pm- 7am, please page neurology on call as listed in AMION.

## 2016-06-22 NOTE — Progress Notes (Signed)
RT Note: ET tube found to be at 24 @ the lips, advanced 2cm to 26 per MD order. RT will continue to monitor.

## 2016-06-22 NOTE — Progress Notes (Signed)
PULMONARY / CRITICAL CARE MEDICINE   Name: Dale [name] MRN: [id number] DOB: 12/29/1965    ADMISSION DATE:  06/16/2016 CONSULTATION DATE:  06/16/2016  REFERRING MD:  EDP Dr Effie Shy  CHIEF COMPLAINT:  AMS  HISTORY OF PRESENT ILLNESS:   50 year old male with complex medical history as outlined below. He presented to [facility] ED from [facility] 7/13 with complaints of AMS. His recent medical course over the past few months has been complicated as noted in discharge summary from Dr. Malachi Bonds 06/01/16:  3/14-4/6: Admitted with strep pneumo meningitis, c/b cardiac arrest, and cardiogenic shock (EF 20%, recovered to 50%) briefly on amiodarone, ARDS requiring prolonged intubation and trach (now removed), ARF, shock liver, and adrenal insufficiency. He was discharged on 4/6 with weekly Pen G IM until 4/10.  4/16-5/1: Readmitted with fall, altered mental status. Repeat LP had no growth, TEE without vegetation, ultimately transitioned to ceftriaxone 2g BID with plans for >4 weeks IV antibiotics. During this hospitalization, he was planning for SNF placement until the day of discharge when the patient's brother asked to take him home (it appears that this wasn't verified with the patient's estranged but legally married wife).  Since discharge, patient has returned to the ED 4 times because of loss of PICC line, PICC requiring to be flushed, PICC coming out, or patient wandering unattended in the street (he has dementia since his meningitis).  5/24-5/26: Readmitted for placement to SNF, given additional week of rocephin and was supposed to follow up with ID before stopping rocephin.  6/15-6/28: admit for occult sepsis and seizures, no source identified, postulated to be aspiration PNA related. He was treated with broad spectrum ABX, cultures were negative and he was returned to SNF. He never followed up with ID for meningitis ABX recommendations. He was seen by them inpatient who recommended they be stopped.    7/13 again presenting with AMS thought to be septic/PNA in origin but had witnessed seizure in ED. He was found to be hypotensive and hypothermic, however WBC and Lactic were wnl. CXR with LLL opacification He required CVL placement and vasopressors for shock despite aggressive IVF resuscitation.  He will need ICU admission for shock treatment and continuous EEG. He was intubated in the ED for airway protection and transferred to [facility]. At tiime of pccm MD eval - intubated, has femoral line and on levophed  SUBJECTIVE: No sig change.  Required levophed overnight, now off again.  Ongoing urinary retention.   REVIEW OF SYSTEMS:  Unable to obtain given intubation.  VITAL SIGNS: BP 105/69 mmHg  Pulse 47  Temp(Src) 97.2 F (36.2 C) (Axillary)  Resp 15  Ht  (1.727 m)  Wt 67.5 kg (148 lb 13 oz)  BMI 22.63 kg/m2  SpO2 100%  HEMODYNAMICS: CVP:  [7 mmHg-17 mmHg] 11 mmHg  VENTILATOR SETTINGS: Vent Mode:  [-] PRVC FiO2 (%):  [30 %] 30 % Set Rate:  [14 bmp] 14 bmp Vt Set:  [620 mL] 620 mL PEEP:  [5 cmH20] 5 cmH20 Pressure Support:  [8 cmH20] 8 cmH20 Plateau Pressure:  [17 cmH20-18 cmH20] 18 cmH20  INTAKE / OUTPUT: I/O last 3 completed shifts: In: 5995 [I.V.:2700; NG/GT:1517.5; IV Piggyback:1777.5] Out: 2579 [Urine:2239; Stool:340]  PHYSICAL EXAMINATION: General:  Critically and chronically ill looking male.  On ventilator, NAD  Neuro:  RASS -1 equivalent . Off all sedation.  Will open eyes, occasionally follow a single command with [facility] prompting/stimulation.   HEENT:  Intubated. OGT, PERL pinpoint Cardiovascular:  Normal heart sounds Lungs:  resps even non labored on PS 8/5, few scattered rhonchi  Abdomen:  soft Musculoskeletal:  No cyanosis. No clubbing   LABS: PULMONARY  Recent Labs Lab 06/16/16 1922 06/16/16 2243  PHART 7.378 7.482*  PCO2ART 38.4 24.7*  PO2ART 82.7 199*  HCO3 23.5 18.7*  TCO2 22.8 17.3  O2SAT 97.0 99.5    CBC  Recent Labs Lab  06/19/16 0400 06/20/16 0508 06/22/16 0520  HGB 8.1* 7.5* 7.2*  HCT 24.3* 22.4* 21.5*  WBC 11.1* 13.3* 8.3  PLT 96* 79* 87*    COAGULATION  Recent Labs Lab 06/17/16 0459  INR 1.20    CARDIAC    Recent Labs Lab 06/16/16 2243 06/17/16 0557 06/17/16 2240 06/18/16 0540  TROPONINI <0.03 <0.03 <0.03 <0.03   No results for input(s): PROBNP in the last 168 hours.   CHEMISTRY  Recent Labs Lab 06/18/16 0900 06/18/16 1650 06/19/16 0400 06/19/16 1700 06/20/16 0508 06/21/16 0530 06/21/16 1830 06/22/16 0520  NA  --   --  144  --  144 146* 146* 146*  K  --   --  3.0*  --  2.9* 2.9* 3.5 3.1*  CL  --   --  115*  --  116* 116* 117* 115*  CO2  --   --  21*  --  24 24 25 24   GLUCOSE  --   --  97  --  105* 107* 80 88  BUN  --   --  13  --  14 13 12 14   CREATININE  --   --  1.01  --  0.90 0.82 1.05 1.03  CALCIUM  --   --  8.3*  --  7.9* 8.2* 7.7* 7.9*  MG 1.3* 1.3* 1.9 1.9  --  1.9  --   --   PHOS 3.8 3.0 3.6 2.7  --  1.9*  --   --    Estimated Creatinine Clearance: 81 mL/min (by C-G formula based on Cr of 1.03).   LIVER  Recent Labs Lab 06/16/16 1758 06/17/16 0459  AST 23  --   ALT 23  --   ALKPHOS 59  --   BILITOT 0.6  --   PROT 5.2*  --   ALBUMIN 2.6*  --   INR  --  1.20     INFECTIOUS  Recent Labs Lab 06/16/16 1806 06/16/16 2110  06/17/16 0459 06/18/16 0540 06/19/16 0400  LATICACIDVEN 0.73 0.97  --   --   --   --   PROCALCITON  --   --   < > 0.23 0.16 0.10  < > = values in this interval not displayed.   ENDOCRINE CBG (last 3)   Recent Labs  06/21/16 1957 06/21/16 2313 06/22/16 0356  GLUCAP 77 72 79     Mr Brain W Wo Contrast  06/21/2016  CLINICAL DATA:  Initial evaluation for recurrent altered mental status, seizures, history of strep pneumo meningitis. Question new areas of and within the left basal ganglia. EXAM: MRI HEAD WITHOUT AND WITH CONTRAST TECHNIQUE: Multiplanar, multiecho pulse sequences of the brain and surrounding  structures were obtained without and with intravenous contrast. CONTRAST:  14mL MULTIHANCE GADOBENATE DIMEGLUMINE 529 MG/ML IV SOLN COMPARISON:  Comparison made with prior CT from 06/17/2016 as well as previous MRI from 03/24/2016. FINDINGS: Diffuse prominence of the CSF containing spaces compatible with generalized cerebral atrophy. There has been normal interval expected evolution of previously identified acute ischemic infarct involving the left corona radiata/basal ganglia,  which now demonstrates progressive encephalomalacia without significant diffusion abnormality. Question minimal faint enhancement within this region extending towards the left cerebral peduncle, likely related to the late subacute ischemia (series 11, image 28). Additional remote infarctions involving the bilateral basal ganglia again seen. Chronic blood products again noted within the left external capsule. No new areas of restricted diffusion to suggest new ischemia or injury. Similar FLAIR signal abnormality at the right parietal cortex. Associated subtle laminar necrosis within this region (series 3, image 6). No significant enhancement now seen within this region. Additional focus of cortical flares abnormality within the right frontal lobe slightly more prominent as compared to previous exam, although this appears to be chronic in nature. No appreciable enhancement within these areas. No other abnormal enhancement elsewhere within the brain. Major intracranial vascular flow voids are preserved. No mass lesion, mass effect, or midline shift. Layering susceptibility artifact within the stent was of both lateral ventricles, likely debris/chronic hemorrhage related to prior meningitis, similar to prior. No hydrocephalus. Craniocervical junction within normal limits. Visualized upper cervical spine unremarkable. Pituitary gland normal. No acute abnormality about the globes and orbits. Extensive opacity throughout the paranasal sinuses with  fluid levels within the sphenoid and maxillary sinuses. Bilateral mastoid effusions. Fluid within the nasopharynx. Patient likely intubated. Bone marrow signal intensity within normal limits. No scalp soft tissue abnormality. IMPRESSION: 1. Normal expected interval evolution of previously identified infarct involving the left basal ganglia/corona radiata. Additionally, previously seen signal abnormalities within the bilateral basal ganglia have also largely resolved relative to previous MRI from 03/24/2016. Similarly, changes at the right parietal cortex have also evolved. No new areas of ischemia or injury identified. 2. Persistent layering debris/hemorrhage within the lateral ventricles, likely related to recent history of meningitis. No hydrocephalus. Electronically Signed   By: Rise MuBenjamin  McClintock M.D.   On: 06/21/2016 04:55   Dg Chest Port 1 View  06/22/2016  CLINICAL DATA:  Respiratory failure, acute encephalopathy, cardiogenic shock, acute and chronic CHF, current smoker. EXAM: PORTABLE CHEST 1 VIEW COMPARISON:  Portable chest x-ray of June 19, 2016 FINDINGS: The right lung is adequately inflated and clear. On the left there is persistent basilar atelectasis or pneumonia with small pleural effusion. The heart is normal in size. The pulmonary vascularity is not engorged. The endotracheal tube tip lies 5.3 cm above the carina. The esophagogastric tube tip projects below the inferior margin of the image. The left internal jugular venous catheter tip projects over the proximal third of the SVC. IMPRESSION: Persistent left lower lobe atelectasis or pneumonia with trace pleural effusion. No CHF. The support tubes are in stable position. Electronically Signed   By: David  SwazilandJordan M.D.   On: 06/22/2016 07:20   EVENTS: 7/13 - Admit w/ septic shock & seizure  MICROBIOLOGY: Blood Ctx x2 7/13>>> Urine Ctx 7/13>>> No Growth MRSA PCR 7/14:  Negative  ANTIBIOTICS: Vancomycin 7/13 - 7/17 Zosyn 7/13  >  STUDIES:  CT head 7/14: New areas of subacute to chronic ischemia on the left. Progression in the degree of sinusitis as described. EEG 7/14: This was an abnormal EEG due to the presence of moderate background slowing. This slowing is non-specific and is indicative of encephalopathy. Port CXR 7/16:  L IJ CVL with tip over SVC. Enteric tube below hemidiaphragm. ETT above carina.  MRI  7/18: 1. Normal expected interval evolution of previously identified infarct involving the left basal ganglia/corona radiata. Additionally, previously seen signal abnormalities within the bilateral basal ganglia have also largely resolved relative to  previous MRI from 03/24/2016. Similarly, changes at the right parietal cortex have also evolved. No new areas of ischemia or injury identified.  2. Persistent layering debris/hemorrhage within the lateral ventricles, likely related to recent history of meningitis. No Hydrocephalus. Port CXR 7/19: Left basilar opacity unchanged. Endotracheal tube needs to be advanced 2 cm.  LINES/TUBES: OETT 7.5 7/13>> L IJ CVL 07/13>> OGT 7/13>> Foley 7/19>>   DISCUSSION: 50 y.o. M with complex PMH including recent admissions for strep pneumo meningitis c/b cardiac arrest and cardiogenic shock (March 2017).  Had trach at the time but has since been decannulated.  Now resides at [facility] and was admitted again 07/13 with septic shock shock due to PNA as well as seizures.  He required intubation in ED for airway protection.   ASSESSMENT / PLAN:  NEUROLOGIC A:   Acute Encephalopathy - ongoing.  Has intermittent periods of improving mental status but continue to wax and wane.  Seizures. New Subacute Ischemia - Seen on CT scan. H/O strep pneumo meningitis, hydrocephalus, CVA.  P:   RASS goal: 0  Daily WUA AED per Neuro:  Keppra & Depacon Ativan IV prn seizure Neurology Following & appreciate recommendations Thiamine & FA VT daily Continuous EEG planned per neuro    INFECTIOUS A:   Sepsis - Possible HCAP Possible HCAP H/O strep pneumo meningitis (march 2017).  P:   Empiric Zosyn Day # 7 Following cultures to completion Consider LP - hold for now  Trend CBC/ Fever Curve  PULMONARY A: Acute Hypoxic Respiratory Failure Unable to Protect Airway - In setting of seizures. HCAP H/O Tracheostomy March 2017 - S/P Decannulation.  P:   Continue full vent support Daily SBT and PS if tolerates but mental status does not support extubation  Suspect will need re-do trach  Abx / cultures per ID section. Intermittent CXR & ABG Advancing ETT 2cm  CARDIOVASCULAR A:  Shock - Resolved. Possibly secondary to sepsis. Bradycardia resolving H/O Cardiac Arrest w/ NSTEMI H/O Systolic CHF - EF 45 - 50% per echo from June 2017 H/O Hyperlipidemia  P:  Continue Telemetry monitoring  Goal MAP 65 Vitals per Unit Protocol Continue outpatient ASA & atorvastatin. Resume stress steroids 7/19 -- cortisol 4.3, hypotension, CVP 10  RENAL A:   Hypokalemia - Replacing. Hyperchloremia - Mild. Metabolic Acidosis - Mild. Likely secondary to hyperchloremia. Pseudohypocalcemia - corrects to 9.0 7/14 Hypophosphoremia  - Replaced. Hypomagnesemia - Resolved. Urinary retention - Foley replaced.  P:   Trending UOP with Foley Bethanechol started 7/18 Monitoring electrolytes & renal function daily Replacing electrolytes as indicated D5 w/ 1/2NS @75  KCl VT x2  GASTROINTESTINAL A:   No acute issues. P:   Protonix VT daily NPO. TF at goal   HEMATOLOGIC A:   Anemia - Chronic. No signs of active bleeding. Hgb stable. Leukocytosis - Resolved. Thrombocytopenia - Mild.   P:  Transfuse for Hgb < 7. SCD's  Trending cell counts daily w/ CBC  ENDOCRINE A:   Risk of Hyperglycemia - On stress steroids. Adrenal Insufficiency - Cortisol 4.3.  P:   Resume Stress dose steroids 7/19 -- recurrent hypotension when they were stopped.  SSI per Standard  Algorithm Accu-Checks q4hr  FAMILY  - Updates: none at bedside 7/19  - Inter-disciplinary family meet or Palliative Care meeting due by:  06/23/16   Dirk Dress, NP 06/22/2016  9:05 AM Pager: [phone] or [phone]  PCCM Attending Note: Patient seen and examined with nurse practitioner. Please refer to her progress  note which I have reviewed in detail. Mental status continuing to fluctuate intermittently as he is intermittently following commands. Awaiting continuous EEG. Suspect patient may require tracheostomy placement eventually. Replacing potassium. Advancing endotracheal tube given findings on chest x-ray reviewed by me today.  I spent a total of 32 minutes of critical care time today caring for the patient and reviewing the patient's electronic medical record as well as discussing the plan of care with neurology at bedside.  Donna Christen Jamison Neighbor, M.D. [facility] Pulmonary & Critical Care Pager:  [phone] After 3pm or if no response, call [phone] 10:22 AM 06/22/2016

## 2016-06-23 LAB — GLUCOSE, CAPILLARY
GLUCOSE-CAPILLARY: 108 mg/dL — AB (ref 65–99)
GLUCOSE-CAPILLARY: 101 mg/dL — AB (ref 65–99)
GLUCOSE-CAPILLARY: 111 mg/dL — AB (ref 65–99)
Glucose-Capillary: 121 mg/dL — ABNORMAL HIGH (ref 65–99)
Glucose-Capillary: 81 mg/dL (ref 65–99)
Glucose-Capillary: 107 mg/dL — ABNORMAL HIGH (ref 65–99)

## 2016-06-23 LAB — CBC WITH DIFFERENTIAL/PLATELET
BASOS PCT: 0 %
Basophils Absolute: 0 10*3/uL (ref 0.0–0.1)
Eosinophils Absolute: 0 10*3/uL (ref 0.0–0.7)
Eosinophils Relative: 0 %
HEMATOCRIT: 23.2 % — AB (ref 39.0–52.0)
HEMOGLOBIN: 7.5 g/dL — AB (ref 13.0–17.0)
LYMPHS ABS: 2.1 10*3/uL (ref 0.7–4.0)
Lymphocytes Relative: 27 %
MCH: 28 pg (ref 26.0–34.0)
MCHC: 32.3 g/dL (ref 30.0–36.0)
MCV: 86.6 fL (ref 78.0–100.0)
MONO ABS: 0.4 10*3/uL (ref 0.1–1.0)
MONOS PCT: 5 %
NEUTROS ABS: 5.3 10*3/uL (ref 1.7–7.7)
NEUTROS PCT: 67 %
Platelets: 95 10*3/uL — ABNORMAL LOW (ref 150–400)
RBC: 2.68 MIL/uL — ABNORMAL LOW (ref 4.22–5.81)
RDW: 16.7 % — AB (ref 11.5–15.5)
WBC: 7.9 10*3/uL (ref 4.0–10.5)

## 2016-06-23 LAB — RENAL FUNCTION PANEL
ALBUMIN: 1.8 g/dL — AB (ref 3.5–5.0)
Anion gap: 5 (ref 5–15)
BUN: 16 mg/dL (ref 6–20)
CALCIUM: 7.9 mg/dL — AB (ref 8.9–10.3)
CO2: 26 mmol/L (ref 22–32)
Chloride: 113 mmol/L — ABNORMAL HIGH (ref 101–111)
Creatinine, Ser: 0.84 mg/dL (ref 0.61–1.24)
GFR calc Af Amer: >60 mL/min (ref >60)
GFR calc non Af Amer: >60 mL/min (ref >60)
GLUCOSE: 102 mg/dL — AB (ref 65–99)
PHOSPHORUS: 4.3 mg/dL (ref 2.5–4.6)
POTASSIUM: 3.6 mmol/L (ref 3.5–5.1)
SODIUM: 144 mmol/L (ref 135–145)

## 2016-06-23 LAB — MAGNESIUM: Magnesium: 1.8 mg/dL (ref 1.7–2.4)

## 2016-06-23 MED ORDER — VITAL AF 1.2 CAL PO LIQD
1000.0000 mL | ORAL | Status: DC
  Administered 2016-06-23 – 2016-06-24 (×2): 1000 mL

## 2016-06-23 MED ORDER — HYDROCORTISONE NA SUCCINATE PF 100 MG IJ SOLR
50.0000 mg | Freq: Three times a day (TID) | INTRAMUSCULAR | Status: DC
  Administered 2016-06-23 – 2016-06-24 (×2): 50 mg via INTRAVENOUS
  Filled 2016-06-23 (×2): qty 2

## 2016-06-23 NOTE — Procedures (Signed)
Electroencephalogram report- LTM  Ordering Physician : Dr. Amada JupiterKirkpatrick EEG number: [phone]    Beginning date or time: 06/22/2016 9:42AM Ending date or time: 06/23/2016 8:00 AM  Day of study: day 1  Medications include: Per EMR  HISTORY: This 24 hours of intensive EEG monitoring with simultaneous video monitoring was performed for this patient with altered mental status. This EEG was requested to rule out subclinical electrographic seizures.  TECHNICAL DESCRIPTION:  The study consists of a continuous 16-channel multi-montage digital video EEG recording with twenty-one electrodes placed according to the International 10-20 System. Additional leads included eye leads, true temporal leads (T1, T2), and an EKG lead. Activation procedures were not done due to mental status.  REPORT: The background activity in this tracing consisted of polymorphic theta and intermixed delta, with best of about 7 Hz. The activity was symmetric and reactive to stimulation. No focal slowing or epileptiform activity was identified.    IMPRESSION: This is an abnormal EEG due to: Diffuse slowing.  CLINICAL CORRELATION: This EEG is consistent with non-specific mild diffuse encephalopathy. No electrographic seizures were seen.

## 2016-06-23 NOTE — Progress Notes (Signed)
PULMONARY / CRITICAL CARE MEDICINE   Name: Dale [name] MRN: [id number] DOB: 1966/03/24    ADMISSION DATE:  06/16/2016 CONSULTATION DATE:  06/16/2016  REFERRING MD:  EDP Dr Effie Shy  CHIEF COMPLAINT:  AMS  HISTORY OF PRESENT ILLNESS:   50 year old male with complex medical history as outlined below. He presented to [facility] ED from [facility] 7/13 with complaints of AMS. His recent medical course over the past few months has been complicated as noted in discharge summary from Dr. Malachi Bonds 06/01/16:  3/14-4/6: Admitted with strep pneumo meningitis, c/b cardiac arrest, and cardiogenic shock (EF 20%, recovered to 50%) briefly on amiodarone, ARDS requiring prolonged intubation and trach (now removed), ARF, shock liver, and adrenal insufficiency. He was discharged on 4/6 with weekly Pen G IM until 4/10.  4/16-5/1: Readmitted with fall, altered mental status. Repeat LP had no growth, TEE without vegetation, ultimately transitioned to ceftriaxone 2g BID with plans for >4 weeks IV antibiotics. During this hospitalization, he was planning for SNF placement until the day of discharge when the patient's brother asked to take him home (it appears that this wasn't verified with the patient's estranged but legally married wife).  Since discharge, patient has returned to the ED 4 times because of loss of PICC line, PICC requiring to be flushed, PICC coming out, or patient wandering unattended in the street (he has dementia since his meningitis).  5/24-5/26: Readmitted for placement to SNF, given additional week of rocephin and was supposed to follow up with ID before stopping rocephin.  6/15-6/28: admit for occult sepsis and seizures, no source identified, postulated to be aspiration PNA related. He was treated with broad spectrum ABX, cultures were negative and he was returned to SNF. He never followed up with ID for meningitis ABX recommendations. He was seen by them inpatient who recommended they be stopped.    7/13 again presenting with AMS thought to be septic/PNA in origin but had witnessed seizure in ED. He was found to be hypotensive and hypothermic, however WBC and Lactic were wnl. CXR with LLL opacification He required CVL placement and vasopressors for shock despite aggressive IVF resuscitation.  He will need ICU admission for shock treatment and continuous EEG. He was intubated in the ED for airway protection and transferred to [facility]. At tiime of pccm MD eval - intubated, has femoral line and on levophed  SUBJECTIVE:  More awake.  Off levophed.  Following commands but apneic on SBT.   REVIEW OF SYSTEMS:  Shakes head "no" to chest pain, SOB.    VITAL SIGNS: BP 90/58 mmHg  Pulse 63  Temp(Src) 97.5 F (36.4 C) (Axillary)  Resp 14  Ht 5\' 8"  (1.727 m)  Wt 71.6 kg (157 lb 13.6 oz)  BMI 24.01 kg/m2  SpO2 100%  HEMODYNAMICS: CVP:  [0 mmHg-20 mmHg] 8 mmHg  VENTILATOR SETTINGS: Vent Mode:  [-] PRVC FiO2 (%):  [30 %] 30 % Set Rate:  [14 bmp] 14 bmp Vt Set:  [620 mL] 620 mL PEEP:  [5 cmH20] 5 cmH20 Plateau Pressure:  [15 cmH20-18 cmH20] 16 cmH20  INTAKE / OUTPUT: I/O last 3 completed shifts: In: 5175 [I.V.:2475; NG/GT:1925; IV Piggyback:775] Out: 3030 [Urine:3030]  PHYSICAL EXAMINATION: General:  Critically and chronically ill looking male.  On ventilator, NAD  Neuro:  RASS 0. Off all sedation.  More awake. Opens eyes, tracks, follows simple commands but drifts off quickly.   HEENT:  Intubated. OGT, PERL pinpoint Cardiovascular:  Normal heart sounds Lungs:  resps even non labored on vent, few scattered rhonchi, apneic on SBT Abdomen:  soft Musculoskeletal:  No cyanosis. No clubbing   LABS: PULMONARY  Recent Labs Lab 06/16/16 1922 06/16/16 2243  PHART 7.378 7.482*  PCO2ART 38.4 24.7*  PO2ART 82.7 199*  HCO3 23.5 18.7*  TCO2 22.8 17.3  O2SAT 97.0 99.5    CBC  Recent Labs Lab 06/20/16 0508 06/22/16 0520 06/23/16 0535  HGB 7.5* 7.2* 7.5*  HCT 22.4* 21.5*  23.2*  WBC 13.3* 8.3 7.9  PLT 79* 87* 95*    COAGULATION  Recent Labs Lab 06/17/16 0459  INR 1.20    CARDIAC    Recent Labs Lab 06/16/16 2243 06/17/16 0557 06/17/16 2240 06/18/16 0540  TROPONINI <0.03 <0.03 <0.03 <0.03   No results for input(s): PROBNP in the last 168 hours.   CHEMISTRY  Recent Labs Lab 06/18/16 1650 06/19/16 0400 06/19/16 1700 06/20/16 0508 06/21/16 0530 06/21/16 1830 06/22/16 0520 06/23/16 0535  NA  --  144  --  144 146* 146* 146* 144  K  --  3.0*  --  2.9* 2.9* 3.5 3.1* 3.6  CL  --  115*  --  116* 116* 117* 115* 113*  CO2  --  21*  --  24 24 25 24 26   GLUCOSE  --  97  --  105* 107* 80 88 102*  BUN  --  13  --  14 13 12 14 16   CREATININE  --  1.01  --  0.90 0.82 1.05 1.03 0.84  CALCIUM  --  8.3*  --  7.9* 8.2* 7.7* 7.9* 7.9*  MG 1.3* 1.9 1.9  --  1.9  --   --  1.8  PHOS 3.0 3.6 2.7  --  1.9*  --   --  4.3   Estimated Creatinine Clearance: 100.7 mL/min (by C-G formula based on Cr of 0.84).   LIVER  Recent Labs Lab 06/16/16 1758 06/17/16 0459 06/23/16 0535  AST 23  --   --   ALT 23  --   --   ALKPHOS 59  --   --   BILITOT 0.6  --   --   PROT 5.2*  --   --   ALBUMIN 2.6*  --  1.8*  INR  --  1.20  --      INFECTIOUS  Recent Labs Lab 06/16/16 1806 06/16/16 2110  06/17/16 0459 06/18/16 0540 06/19/16 0400  LATICACIDVEN 0.73 0.97  --   --   --   --   PROCALCITON  --   --   < > 0.23 0.16 0.10  < > = values in this interval not displayed.   ENDOCRINE CBG (last 3)   Recent Labs  06/22/16 2335 06/23/16 0347 06/23/16 0754  GLUCAP 84 121* 108*     Dg Chest Port 1 View  06/22/2016  CLINICAL DATA:  Respiratory failure, acute encephalopathy, cardiogenic shock, acute and chronic CHF, current smoker. EXAM: PORTABLE CHEST 1 VIEW COMPARISON:  Portable chest x-ray of June 19, 2016 FINDINGS: The right lung is adequately inflated and clear. On the left there is persistent basilar atelectasis or pneumonia with small pleural  effusion. The heart is normal in size. The pulmonary vascularity is not engorged. The endotracheal tube tip lies 5.3 cm above the carina. The esophagogastric tube tip projects below the inferior margin of the image. The left internal jugular venous catheter tip projects over the proximal third of the SVC. IMPRESSION: Persistent left lower lobe atelectasis or  pneumonia with trace pleural effusion. No CHF. The support tubes are in stable position. Electronically Signed   By: David  [place] M.D.   On: 06/22/2016 07:20   EVENTS: 7/13 - Admit w/ septic shock & seizure  MICROBIOLOGY: Blood Ctx x2 7/13:  Negative  Urine Ctx 7/13:  Negative  MRSA PCR 7/14:  Negative  ANTIBIOTICS: Vancomycin 7/13 - 7/17 Zosyn 7/13 - 7/20  STUDIES:  CT head 7/14: New areas of subacute to chronic ischemia on the left. Progression in the degree of sinusitis as described. EEG 7/14: This was an abnormal EEG due to the presence of moderate background slowing. This slowing is non-specific and is indicative of encephalopathy. Port CXR 7/16:  L IJ CVL with tip over SVC. Enteric tube below hemidiaphragm. ETT above carina.  MRI  7/18: 1. Normal expected interval evolution of previously identified infarct involving the left basal ganglia/corona radiata. Additionally, previously seen signal abnormalities within the bilateral basal ganglia have also largely resolved relative to previous MRI from 03/24/2016. Similarly, changes at the right parietal cortex have also evolved. No new areas of ischemia or injury identified.  2. Persistent layering debris/hemorrhage within the lateral ventricles, likely related to recent history of meningitis. No Hydrocephalus. Port CXR 7/19: Left basilar opacity unchanged. Endotracheal tube needs to be advanced 2 cm. Continuous EEG 7/20:  Mild diffuse encephalopathy. No electrographic seizures.  LINES/TUBES: OETT 7.5 7/13>> L IJ CVL 07/13>> OGT 7/13>> Foley 7/19>>   DISCUSSION: 50 y.o. M with  complex PMH including recent admissions for strep pneumo meningitis c/b cardiac arrest and cardiogenic shock (March 2017).  Had trach at the time but has since been decannulated.  Now resides at [facility] and was admitted again 07/13 with septic shock shock due to PNA as well as seizures.  He required intubation in ED for airway protection.   ASSESSMENT / PLAN:  NEUROLOGIC A:   Acute Encephalopathy - ongoing.  Seems to be slowly improving.  Seizures. New Subacute Ischemia - Seen on CT scan. H/O strep pneumo meningitis, hydrocephalus, CVA.  P:   RASS goal: 0  Daily WUA AED per Neuro:  Keppra & Depacon Ativan IV prn seizure Neurology Following & appreciate recommendations Thiamine & FA VT daily Continuous EEG finishing   INFECTIOUS A:   Sepsis - Possible HCAP Possible HCAP H/O strep pneumo meningitis (march 2017).  P:   Will d/c abx 7/20-- s/p 8 day course zosyn  Following cultures to completion Trend CBC/ Fever Curve  PULMONARY A: Acute Hypoxic Respiratory Failure Unable to Protect Airway - In setting of seizures. HCAP H/O Tracheostomy March 2017 - S/P Decannulation.  P:   Continue full vent support Daily SBT and PS trials Hold off on trach for now given steady improvement in mental status  Abx / cultures per ID section. Intermittent CXR & ABG  CARDIOVASCULAR A:  Shock - Resolved. Possibly secondary to sepsis. Bradycardia resolving H/O Cardiac Arrest w/ NSTEMI H/O Systolic CHF - EF 45 - 50% per echo from June 2017 H/O Hyperlipidemia  P:  Continue Telemetry monitoring  Goal MAP 65 Vitals per Unit Protocol Continue outpatient ASA & atorvastatin. Resume stress steroids 7/19 -- cortisol 4.3, hypotension, CVP 10  RENAL A:   Hypokalemia - Replacing. Hyperchloremia - Mild. Metabolic Acidosis - Mild. Likely secondary to hyperchloremia. Pseudohypocalcemia - corrects to 9.0 7/14 Hypophosphoremia  - Replaced. Hypomagnesemia - Resolved. Urinary retention - Foley  replaced.  P:   Trending UOP with Foley Bethanechol started 7/18 Monitoring electrolytes & renal function  daily D5 w/ 1/2NS @75  Replacing electrolytes as indicated  GASTROINTESTINAL A:   No acute issues. P:   Protonix VT daily NPO. TF at goal   HEMATOLOGIC A:   Anemia - Chronic. No signs of active bleeding. Hgb stable. Leukocytosis - Resolved. Thrombocytopenia - Mild.   P:  Transfuse for Hgb < 7. SCD's  Trending cell counts daily w/ CBC  ENDOCRINE A:   Risk of Hyperglycemia - On stress steroids. Adrenal Insufficiency - Cortisol 4.3.  P:   Resumed Stress dose steroids 7/19 -- recurrent hypotension when they were stopped.  SSI per Standard Algorithm Accu-Checks q4hr  FAMILY  - Updates: none at bedside 7/20  - Inter-disciplinary family meet or Palliative Care meeting due by:  06/23/16   Dirk DressKaty Whiteheart, NP 06/23/2016  8:31 AM Pager: [phone] or [phone]   PCCM Attending Note: Patient seen and examined with nurse practitioner. Please refer to her progress note which I have reviewed in detail. Mental status very slowly improving. Continuous EEG consistent with nonspecific mild diffuse encephalopathy without electrographic seizures. Hopefully patient's mentation and level of alertness will continue and we will be able to avoid placement of tracheostomy again. Discontinuing Zosyn today. Weaning stress dosed steroids.  I spent a total of 31 minutes of critical care time today caring for the patient and reviewing the patient's electronic medical record.  Donna ChristenJennings E. Jamison NeighborNestor, M.D. [facility] Pager:  [phone] After 3pm or if no response, call [phone] 12:55 PM 06/23/2016

## 2016-06-23 NOTE — Progress Notes (Signed)
LTM EEG D/C'd per Dr Kirkpatrick, no skin breakdown noted. 

## 2016-06-23 NOTE — Clinical Documentation Improvement (Signed)
Critical Care  Possible Conditions:        Malnutrition  Document Severity - Severe (third degree), Moderate (second degree), Mild (first degree)   Malnutrition  Other condition  Clinically Undetermine  Please update your documentation within the medical record to reflect your response to this query. Thank you.  Document any associated diagnoses/conditions in your progress note.  Supporting Information:   Initial Nutrition Assessment : by Arlyss GandyHeather C Pitts, RD at 06/17/2016 10:24 AM   DOCUMENTATION CODES:  Severe malnutrition in context of chronic illness   INTERVENTION:  If pt remains intubated recommend initiating enteral nutrition therapy:  Vital AF 1.2 @ 25 ml/hr and increase by 10 ml every 8 hours to goal rate of 55 ml/hr  Provides: 1320 ml, 1584 kcal, 99 grams protein, and 1070 ml free H2O.  Monitor magnesium and phosphorus every 12 hours, MD to replete as needed, as pt is at risk for refeeding syndrome given severe malnutrition.  NUTRITION DIAGNOSIS:  Malnutrition (Severe) related to chronic illness (meningitis/dementia) as evidenced by severe depletion of muscle mass, severe depletion of body fat.   Please exercise your independent, professional judgment when responding. A specific answer is not anticipated or expected.  Thank You, Nevin BloodgoodJoan B Maymuna Detzel, RN, BSN, CCDS,Clinical Documentation Specialist:  [phone]  [phone]=Cell - Health Information Management

## 2016-06-23 NOTE — Progress Notes (Signed)
Nutrition Follow-up  DOCUMENTATION CODES:   Severe malnutrition in context of chronic illness  INTERVENTION:   Vital AF 1.2 @ 55 ml/hr Provides: 1320 ml, 1584 kcal, 99 grams protein, and 1070 ml H2O.    NUTRITION DIAGNOSIS:   Malnutrition (Severe) related to chronic illness (meningitis/dementia) as evidenced by severe depletion of muscle mass, severe depletion of body fat. Ongoing.   GOAL:   Patient will meet greater than or equal to 90% of their needs Met.   MONITOR:   Vent status, I & O's, Labs, Weight trends  ASSESSMENT:   Pt admitted from SNF pt with multiple admissions since March 2017 when he had strep pneumo meningitis, cardiac arrest prolonged intubation. Pt now readmitted with sepsis and seizure.   Patient is currently intubated on ventilator support MV: 8.4 L/min Temp (24hrs), Avg:97.4 F (36.3 C), Min:97.4 F (36.3 C), Max:97.5 F (36.4 C)  Medications reviewed and include: folvite, solucortef, thiamine,  Labs reviewed: electrolytes WNL CBG's: 69-121 OG tube Weight increased by 20 lb, pt is 15 L positive since admission Pt discussed during ICU rounds and with RN.   Diet Order:    NPO  Skin:  Reviewed, no issues  Last BM:  7/19  Height:   Ht Readings from Last 1 Encounters:  06/17/16 '5\' 8"'$  (1.727 m)    Weight:   Wt Readings from Last 1 Encounters:  06/23/16 157 lb 13.6 oz (71.6 kg)    Ideal Body Weight:  70 kg  BMI:  Body mass index is 24.01 kg/(m^2).  Estimated Nutritional Needs:   Kcal:  1503  Protein:  90-115 grams  Fluid:  > 1.5 L/day  EDUCATION NEEDS:   No education needs identified at this time  [place], [place], [place] Pager [phone] After Hours Pager

## 2016-06-24 LAB — CBC
HCT: 23.3 % — ABNORMAL LOW (ref 39.0–52.0)
Hemoglobin: 7.7 g/dL — ABNORMAL LOW (ref 13.0–17.0)
MCH: 28.7 pg (ref 26.0–34.0)
MCHC: 33 g/dL (ref 30.0–36.0)
MCV: 86.9 fL (ref 78.0–100.0)
PLATELETS: 117 10*3/uL — AB (ref 150–400)
RBC: 2.68 MIL/uL — AB (ref 4.22–5.81)
RDW: 16.5 % — ABNORMAL HIGH (ref 11.5–15.5)
WBC: 9.3 10*3/uL (ref 4.0–10.5)

## 2016-06-24 LAB — BASIC METABOLIC PANEL
ANION GAP: 7 (ref 5–15)
BUN: 17 mg/dL (ref 6–20)
CO2: 28 mmol/L (ref 22–32)
Calcium: 8 mg/dL — ABNORMAL LOW (ref 8.9–10.3)
Chloride: 111 mmol/L (ref 101–111)
Creatinine, Ser: 0.84 mg/dL (ref 0.61–1.24)
GFR calc Af Amer: >60 mL/min (ref >60)
Glucose, Bld: 117 mg/dL — ABNORMAL HIGH (ref 65–99)
POTASSIUM: 3.5 mmol/L (ref 3.5–5.1)
SODIUM: 146 mmol/L — AB (ref 135–145)

## 2016-06-24 LAB — GLUCOSE, CAPILLARY
GLUCOSE-CAPILLARY: 108 mg/dL — AB (ref 65–99)
GLUCOSE-CAPILLARY: 107 mg/dL — AB (ref 65–99)
GLUCOSE-CAPILLARY: 86 mg/dL (ref 65–99)
Glucose-Capillary: 75 mg/dL (ref 65–99)
Glucose-Capillary: 59 mg/dL — ABNORMAL LOW (ref 65–99)
Glucose-Capillary: 133 mg/dL — ABNORMAL HIGH (ref 65–99)
Glucose-Capillary: 77 mg/dL (ref 65–99)

## 2016-06-24 LAB — PHOSPHORUS: PHOSPHORUS: 3.9 mg/dL (ref 2.5–4.6)

## 2016-06-24 LAB — MAGNESIUM: Magnesium: 2 mg/dL (ref 1.7–2.4)

## 2016-06-24 MED ORDER — DEXTROSE 50 % IV SOLN
50.0000 mL | Freq: Once | INTRAVENOUS | Status: AC
  Administered 2016-06-24: 50 mL via INTRAVENOUS

## 2016-06-24 MED ORDER — HYDROCORTISONE NA SUCCINATE PF 100 MG IJ SOLR
50.0000 mg | Freq: Two times a day (BID) | INTRAMUSCULAR | Status: DC
  Administered 2016-06-24 – 2016-06-26 (×5): 50 mg via INTRAVENOUS
  Filled 2016-06-24 (×5): qty 2

## 2016-06-24 MED ORDER — DEXTROSE 50 % IV SOLN
INTRAVENOUS | Status: AC
  Filled 2016-06-24: qty 50

## 2016-06-24 MED ORDER — POTASSIUM CHLORIDE 10 MEQ/100ML IV SOLN
10.0000 meq | INTRAVENOUS | Status: AC
  Administered 2016-06-24 (×4): 10 meq via INTRAVENOUS
  Filled 2016-06-24 (×3): qty 100

## 2016-06-24 MED ORDER — ATORVASTATIN CALCIUM 20 MG PO TABS
20.0000 mg | ORAL_TABLET | Freq: Every day | ORAL | Status: DC
  Administered 2016-06-26 – 2016-07-10 (×14): 20 mg via ORAL
  Filled 2016-06-24 (×13): qty 1

## 2016-06-24 MED ORDER — BETHANECHOL CHLORIDE 10 MG PO TABS
10.0000 mg | ORAL_TABLET | Freq: Three times a day (TID) | ORAL | Status: DC
  Administered 2016-06-25 – 2016-07-11 (×42): 10 mg via ORAL
  Filled 2016-06-24 (×56): qty 1

## 2016-06-24 NOTE — Procedures (Signed)
Extubation Procedure Note  Patient Details:   Name: Bonnita HollowCraig D Corea DOB: 09/02/1966 MRN: [id number]   Airway Documentation:     Evaluation  O2 sats: stable throughout Complications: No apparent complications Patient did tolerate procedure well. Bilateral Breath Sounds: Clear   Yes,placed on 4l [place] SPO2 100. Tolerated well, RT to monitor  Toula MoosCampbell, Timothee Gali Faulkner 06/24/2016, 9:31 AM

## 2016-06-24 NOTE — NC FL2 (Signed)
[place] LEVEL OF CARE SCREENING TOOL     IDENTIFICATION  Patient Name: Dale [name] Birthdate: 1966-03-25 Sex: male Admission Date (Current Location): 06/16/2016  [facility] and [place] Number:  Herbalist and Address:  [facility] [place], [place] [address] [place], [place] [address]      Provider Number: [id number]  Attending Physician Name and Address:  Brand Males, MD  Relative Name and Phone Number:       Current Level of Care: Hospital Recommended Level of Care: [place] Prior Approval Number:    Date Approved/Denied:   PASRR Number: [id number]  Discharge Plan: SNF    Current Diagnoses: Patient Active Problem List   Diagnosis Date Noted  . Acute respiratory failure with hypercapnia ([place])   . Encounter for central line placement   . Acute respiratory failure with hypoxemia ([place])   . Hypothermia 05/19/2016  . Seizure ([place])   . Weak 05/06/2016  . Convulsions/seizures ([place]) 05/04/2016  . Urinary retention 05/04/2016  . Hyperlipidemia 05/04/2016  . Dysphagia 05/04/2016  . Low serum cortisol level ([place]) 05/04/2016  . Dementia 04/27/2016  . 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 Moderate protein-calorie malnutrition ([place])   . Palliative care encounter   . Cerebral septic emboli ([place])   . Sepsis ([place])   . Cerebral thrombosis with cerebral infarction 03/25/2016  . Meningoencephalitis   . Bacterial meningitis   . Acute ischemic stroke ([place])   . Severe sepsis ([place])   . Meningitis, pneumococcal, recurrent   . Systolic and diastolic CHF, chronic ([place])   . Acute renal failure ([place])   . Hypernatremia   . NSTEMI (non-ST elevated myocardial infarction) ([place]) 03/14/2016  . Physical deconditioning 03/14/2016  . Tracheostomy status ([place])   . Encounter for palliative care   . Tracheostomy care ([place])   . Respiratory failure ([place]) 03/03/2016  . Acute respiratory failure with hypoxia ([place])   . Septic shock ([place])    . Cardiogenic shock ([place])   . Cardiac arrest ([place])   . Hydrocephalus   . Altered mental status   . CHF (congestive heart failure) ([place])   . 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 Homeless 02/16/2016  . Meningitis, streptococcal 02/16/2016  . Tobacco abuse 02/16/2016  . Gram positive sepsis ([place]) 02/16/2016  . Underweight 02/16/2016  . ETOH abuse   . Substance abuse   . Acute encephalopathy   . Acute respiratory failure (HCC)     Orientation RESPIRATION BLADDER Height & Weight     Self  Normal, O2 Indwelling catheter Weight: 163 lb 2.3 oz (74 kg) Height:  _0  (172.7 cm)  BEHAVIORAL SYMPTOMS/MOOD NEUROLOGICAL BOWEL NUTRITION STATUS      Incontinent    AMBULATORY STATUS COMMUNICATION OF NEEDS Skin   Extensive Assist Verbally Normal                       Personal Care Assistance Level of Assistance  Bathing, Feeding, Dressing Bathing Assistance: Maximum assistance Feeding assistance: Maximum assistance Dressing Assistance: Maximum assistance     Functional Limitations Info  Sight, Hearing, Speech Sight Info: Adequate Hearing Info: Adequate Speech Info: Impaired    SPECIAL CARE FACTORS FREQUENCY  PT (By licensed PT), OT (By licensed OT), Speech therapy     PT Frequency: 3 OT Frequency: 3     Speech Therapy Frequency: 3      Contractures Contractures Info: Present    Additional Factors Info  Code Status,  Allergies, Insulin Sliding Scale, Isolation Precautions Code Status Info: Full Code Allergies Info: No Known Allergies   Insulin Sliding Scale Info: Novolog Every 4 Hours Isolation Precautions Info: MRSA by PCR 03/20/2016     Current Medications (06/24/2016):  This is the current hospital active medication list Current Facility-Administered Medications  Medication Dose Route Frequency Provider Last Rate Last Dose  . 0.9 %  sodium chloride infusion  250 mL Intravenous PRN Brand Males, MD 10 mL/hr at 06/23/16 1301 250 mL at 06/23/16 1301   . antiseptic oral rinse solution (CORINZ)  7 mL Mouth Rinse 10 times per day Brand Males, MD   7 mL at 06/24/16 1514  . aspirin tablet 325 mg  325 mg Per Tube Daily Rahul P Desai, PA-C   325 mg at 06/24/16 0913  . atorvastatin (LIPITOR) tablet 20 mg  20 mg Oral q1800 Erick Colace, NP      . bethanechol (URECHOLINE) tablet 10 mg  10 mg Oral Q8H Erick Colace, NP   10 mg at 06/24/16 1400  . chlorhexidine gluconate (SAGE KIT) (PERIDEX) 0.12 % solution 15 mL  15 mL Mouth Rinse BID Brand Males, MD   15 mL at 06/24/16 0748  . dextrose 5 %-0.45 % sodium chloride infusion   Intravenous Continuous Erick Colace, NP 10 mL/hr at 06/24/16 1500    . folic acid (FOLVITE) tablet 1 mg  1 mg Oral Daily Erick Colace, NP   1 mg at 06/24/16 0913  . hydrocortisone sodium succinate (SOLU-CORTEF) 100 MG injection 50 mg  50 mg Intravenous Q12H Erick Colace, NP   50 mg at 06/24/16 0918  . insulin aspart (novoLOG) injection 2-6 Units  2-6 Units Subcutaneous Q4H Brand Males, MD   2 Units at 06/22/16 2008  . levETIRAcetam (KEPPRA) 1,500 mg in sodium chloride 0.9 % 100 mL IVPB  1,500 mg Intravenous Q12H Charles Stewart   1,500 mg at 06/24/16 0913  . LORazepam (ATIVAN) injection 2 mg  2 mg Intravenous Q4H PRN Roland Earl, MD   2 mg at 06/17/16 1525  . thiamine (VITAMIN B-1) tablet 100 mg  100 mg Oral Daily Chesley Mires, MD   100 mg at 06/24/16 0913  . valproate (DEPACON) 750 mg in dextrose 5 % 50 mL IVPB  750 mg Intravenous Q8H Charles Stewart   750 mg at 06/24/16 9437     Discharge Medications: Please see discharge summary for a list of discharge medications.  Relevant Imaging Results:  Relevant Lab Results:   Additional Information SSN [id number]  Barbette Or, [place]

## 2016-06-24 NOTE — Progress Notes (Signed)
eLink Nursing ICU Electrolyte Replacement Protocol  Patient Name: Bonnita HollowCraig D Bartles DOB: 01/29/1966 MRN: [id number]  Date of Service  06/24/2016   HPI/Events of Note    Recent Labs Lab 06/19/16 0400 06/19/16 1700  06/21/16 0530 06/21/16 1830 06/22/16 0520 06/23/16 0535 06/24/16 0510  NA 144  --   < > 146* 146* 146* 144 146*  K 3.0*  --   < > 2.9* 3.5 3.1* 3.6 3.5  CL 115*  --   < > 116* 117* 115* 113* 111  CO2 21*  --   < > 24 25 24 26 28   GLUCOSE 97  --   < > 107* 80 88 102* 117*  BUN 13  --   < > 13 12 14 16 17   CREATININE 1.01  --   < > 0.82 1.05 1.03 0.84 0.84  CALCIUM 8.3*  --   < > 8.2* 7.7* 7.9* 7.9* 8.0*  MG 1.9 1.9  --  1.9  --   --  1.8 2.0  PHOS 3.6 2.7  --  1.9*  --   --  4.3 3.9  < > = values in this interval not displayed.  Estimated Creatinine Clearance: 100.7 mL/min (by C-G formula based on Cr of 0.84).  Intake/Output      07/20 0701 - 07/21 0700   I.V. (mL/kg) 1650 (22.3)   NG/GT 1210   IV Piggyback 387.5   Total Intake(mL/kg) 3247.5 (43.9)   Urine (mL/kg/hr) 600 (0.3)   Total Output 600   Net +2647.5       Stool Occurrence 1 x    - I/O DETAILED x24h    Total I/O In: 1342.5 [I.V.:675; NG/GT:495; IV Piggyback:172.5] Out: -  - I/O THIS SHIFT    ASSESSMENT   eICURN Interventions  K+ 3.5 Unable to replace using protocol due to inadequate urine output    ASSESSMENT: MAJOR ELECTROLYTE    Merita NortonFrye, Dimitri Shakespeare Nicole 06/24/2016, 6:08 AM

## 2016-06-24 NOTE — Clinical Social Work Note (Signed)
Clinical Social Work Assessment  Patient Details  Name: Dale [name] MRN: [id number] Date of Birth: 07/12/1966  Date of referral:  06/24/16               Reason for consult:  Facility Placement                Permission sought to share information with:  Family Supports Permission granted to share information::  Yes, Verbal Permission Granted  Name::     Dale SkinnerShirley [name]  Relationship::  Spouse  Contact Information:  [phone]  Housing/Transportation Living arrangements for the past 2 months:  Skilled Nursing Facility Source of Information:  Spouse Patient Interpreter Needed:  None Criminal Activity/Legal Involvement Pertinent to Current Situation/Hospitalization:  No - Comment as needed Significant Relationships:  Spouse Lives with:  Facility Resident Do you feel safe going back to the place where you live?  Yes Need for family participation in patient care:  Yes (Comment)  Care giving concerns:  Patient wife does express concern that a bed will not be available at time of discharge.  CSW did communicate that there is that risk of bed availability, but it will expressed to facility of patient family interest to return.   Social Worker assessment / plan:  Visual merchandiserClinical Social Worker spoke with patient wife over the phone to offer support and discuss patient needs at discharge.  Patient wife states [name] and would like to return at discharge.  Patient wife is understanding of bed availability and hopeful that there will be a bed available at discharge.  Patient wife to call the unit for further information regarding patient medical status.  CSW remains available for support and to facilitate patient discharge needs once medically stable.  Employment status:  Disabled (Comment on whether or not currently receiving Disability) Insurance information:  [place]:  Not assessed at this time Information / Referral to community  resources:  Skilled Nursing Facility  Patient/Family's Response to care:  Patient wife verbalized understanding of CSW role and appreciation for support and concern.  Patient wife understands that patient will need continued SNF stay at this time.  Patient/Family's Understanding of and Emotional Response to Diagnosis, Current Treatment, and Prognosis:  Patient wife with basic understanding of patient deficits and the current plan moving forward regarding rehab needs.  Patient family supportive and available as needed by phone but due to schedules have a hard time visiting.  Emotional Assessment Appearance:  Appears older than stated age Attitude/Demeanor/Rapport:  Unable to Assess Affect (typically observed):  Unable to Assess Orientation:  Oriented to Self Alcohol / Substance use:  Not Applicable Psych involvement (Current and /or in the community):  No (Comment)  Discharge Needs  Concerns to be addressed:  No discharge needs identified Readmission within the last 30 days:  Yes Current discharge risk:  None Barriers to Discharge:  Continued Medical Work up  MetLifeJesse Skiler [name], KentuckyLCSW [phone]

## 2016-06-24 NOTE — Progress Notes (Signed)
PULMONARY / CRITICAL CARE MEDICINE   Name: Dale [name] MRN: [id number] DOB: 12/12/1965    ADMISSION DATE:  06/16/2016 CONSULTATION DATE:  06/16/2016  REFERRING MD:  EDP Dr Effie ShyWentz  CHIEF COMPLAINT:  AMS  HISTORY OF PRESENT ILLNESS:   50 year old male with complex medical history including:  3/14-4/6: Admitted with strep pneumo meningitis, c/b cardiac arrest, and cardiogenic shock (EF 20%, recovered to 50%) briefly on amiodarone, ARDS requiring prolonged intubation and trach (now removed), ARF, shock liver, and adrenal insufficiency. He was discharged on 4/6 with weekly Pen G IM until 4/10. Has been re-admitted several times w/ recurrent infection and seen by ID.  Re-admitted 7/13 again presenting with AMS thought to be septic/PNA in origin but had witnessed seizure in ED. He was found to be hypotensive and hypothermic, however WBC and Lactic were wnl. CXR with LLL opacification He required CVL placement and vasopressors for shock despite aggressive IVF resuscitation.  He will need ICU admission for shock treatment and continuous EEG. He was intubated in the ED for airway protection and [facility]. At tiime of pccm MD eval - intubated, has femoral line and on levophed  SUBJECTIVE:  More awake. Off gtts. Following commands. Passed SBT  REVIEW OF SYSTEMS:  Denies complaints. Wants off ETT   VITAL SIGNS: BP 118/68 mmHg  Pulse 43  Temp(Src) 97.2 F (36.2 C) (Axillary)  Resp 11  Ht 5\' 8"  (1.727 m)  Wt 163 lb 2.3 oz (74 kg)  BMI 24.81 kg/m2  SpO2 100%  HEMODYNAMICS: CVP:  [0 mmHg-10 mmHg] 0 mmHg  VENTILATOR SETTINGS: Vent Mode:  [-] CPAP;PSV FiO2 (%):  [30 %] 30 % Set Rate:  [10 bmp-14 bmp] 10 bmp Vt Set:  [500 mL-620 mL] 500 mL PEEP:  [5 cmH20] 5 cmH20 Pressure Support:  [5 cmH20] 5 cmH20 Plateau Pressure:  [11 cmH20-18 cmH20] 11 cmH20  INTAKE / OUTPUT: I/O last 3 completed shifts: In: 45160 [I.V.:2625; NG/GT:1925; IV Piggyback:610] Out: 2300  [Urine:2300]  PHYSICAL EXAMINATION: General:  chronically ill looking male.  On ventilator, NAD  Neuro:  RASS 0. Off all sedation.  More awake. Opens eyes, tracks, follows simple commands HEENT:  Intubated. OGT, PERL pinpoint Cardiovascular:  Normal heart sounds Lungs:  resps even non labored on vent, diffuse  Rhonchi, excellent F/Vt  Abdomen:  soft Musculoskeletal:  No cyanosis. No clubbing   LABS: PULMONARY No results for input(s): PHART, PCO2ART, PO2ART, HCO3, TCO2, O2SAT in the last 168 hours.  Invalid input(s): PCO2, PO2  CBC  Recent Labs Lab 06/22/16 0520 06/23/16 0535 06/24/16 0510  HGB 7.2* 7.5* 7.7*  HCT 21.5* 23.2* 23.3*  WBC 8.3 7.9 9.3  PLT 87* 95* 117*    COAGULATION No results for input(s): INR in the last 168 hours.  CARDIAC    Recent Labs Lab 06/17/16 2240 06/18/16 0540  TROPONINI <0.03 <0.03   No results for input(s): PROBNP in the last 168 hours.   CHEMISTRY  Recent Labs Lab 06/19/16 0400 06/19/16 1700  06/21/16 0530 06/21/16 1830 06/22/16 0520 06/23/16 0535 06/24/16 0510  NA 144  --   < > 146* 146* 146* 144 146*  K 3.0*  --   < > 2.9* 3.5 3.1* 3.6 3.5  CL 115*  --   < > 116* 117* 115* 113* 111  CO2 21*  --   < > 24 25 24 26 28   GLUCOSE 97  --   < > 107* 80 88 102* 117*  BUN  13  --   < > 13 12 14 16 17   CREATININE 1.01  --   < > 0.82 1.05 1.03 0.84 0.84  CALCIUM 8.3*  --   < > 8.2* 7.7* 7.9* 7.9* 8.0*  MG 1.9 1.9  --  1.9  --   --  1.8 2.0  PHOS 3.6 2.7  --  1.9*  --   --  4.3 3.9  < > = values in this interval not displayed. Estimated Creatinine Clearance: 100.7 mL/min (by C-G formula based on Cr of 0.84).   LIVER  Recent Labs Lab 06/23/16 0535  ALBUMIN 1.8*     INFECTIOUS  Recent Labs Lab 06/18/16 0540 06/19/16 0400  PROCALCITON 0.16 0.10     ENDOCRINE CBG (last 3)   Recent Labs  06/23/16 2327 06/24/16 0334 06/24/16 0743  GLUCAP 111* 108* 107*     No results found. EVENTS: 7/13 - Admit w/  septic shock & seizure 7/21 ready for extubation  MICROBIOLOGY: Blood Ctx x2 7/13:  Negative  Urine Ctx 7/13:  Negative  MRSA PCR 7/14:  Negative  ANTIBIOTICS: Vancomycin 7/13 - 7/17 Zosyn 7/13 - 7/20  STUDIES:  CT head 7/14: New areas of subacute to chronic ischemia on the left. Progression in the degree of sinusitis as described. EEG 7/14: This was an abnormal EEG due to the presence of moderate background slowing. This slowing is non-specific and is indicative of encephalopathy. Port CXR 7/16:  L IJ CVL with tip over SVC. Enteric tube below hemidiaphragm. ETT above carina.  MRI  7/18: 1. Normal expected interval evolution of previously identified infarct involving the left basal ganglia/corona radiata. Additionally, previously seen signal abnormalities within the bilateral basal ganglia have also largely resolved relative to previous MRI from 03/24/2016. Similarly, changes at the right parietal cortex have also evolved. No new areas of ischemia or injury identified.  2. Persistent layering debris/hemorrhage within the lateral ventricles, likely related to recent history of meningitis. No Hydrocephalus. Port CXR 7/19: Left basilar opacity unchanged. Endotracheal tube needs to be advanced 2 cm. Continuous EEG 7/20:  Mild diffuse encephalopathy. No electrographic seizures.  LINES/TUBES: OETT 7.5 7/13>> 7/21 L IJ CVL 07/13>> OGT 7/13>> Foley 7/19>>   DISCUSSION: 50 y.o. M with complex PMH including recent admissions for strep pneumo meningitis c/b cardiac arrest and cardiogenic shock (March 2017).  Had trach at the time but has since been decannulated.  Now resides at [facility] and was admitted again 07/13 with septic shock shock due to PNA as well as seizures.  He required intubation in ED for airway protection. Now ready for extubation   ASSESSMENT / PLAN:  NEUROLOGIC A:   Acute Encephalopathy - ongoing.  Seems to be slowly improving.  Seizures. New Subacute Ischemia - Seen on CT  scan. H/O strep pneumo meningitis, hydrocephalus, CVA.  P:   RASS goal: 0  Daily WUA AED per Neuro:  Keppra & Depacon Ativan IV prn seizure Neurology Following & appreciate recommendations Thiamine & FA VT daily  INFECTIOUS A:   Sepsis - Possible HCAP-->completed abx course  Possible HCAP H/O strep pneumo meningitis (march 2017).  P:   Following cultures to completion Trend CBC/ Fever Curve  PULMONARY A: Acute Hypoxic Respiratory Failure Unable to Protect Airway - In setting of seizures. HCAP H/O Tracheostomy March 2017 - S/P Decannulation. ->passed SBT  P:   Extubate Wean O2 Mobilize pulm hygiene   CARDIOVASCULAR A:  Shock - Resolved. Possibly secondary to sepsis. Bradycardia resolving H/O Cardiac  Arrest w/ NSTEMI H/O Systolic CHF - EF 45 - 50% per echo from June 2017 H/O Hyperlipidemia  P:  Continue Telemetry monitoring  Vitals per Unit Protocol Continue outpatient ASA & atorvastatin.  RENAL A:   Hypokalemia - Replacing. Hyperchloremia - Mild. Pseudohypocalcemia - corrects to 9.0 7/14 Urinary retention - Foley replaced.  P:   Trending UOP with Foley Bethanechol started 7/18 KVO IVFs Oral intake should correct Na after extubation Monitoring electrolytes & renal function daily Replacing electrolytes as indicated  GASTROINTESTINAL A:   No acute issues. P:   NPO s/p extubation SLP to eval at bedside  HEMATOLOGIC A:   Anemia - Chronic. No signs of active bleeding. Hgb stable. Leukocytosis - Resolved. Thrombocytopenia - Mild.   P:  Transfuse for Hgb < 7. SCD's  Trending cell counts daily w/ CBC  ENDOCRINE A:   Risk of Hyperglycemia - On stress steroids. Adrenal Insufficiency - Cortisol 4.3.  P:   Taper steroids (decreased to q12 7/21) SSI per Standard Algorithm Accu-Checks q4hr  FAMILY  - Updates: none at bedside 7/20  - Inter-disciplinary family meet or Palliative Care meeting due by:  06/23/16  Simonne Martinet  ACNP-BC [facility] Pulmonary/Critical Care Pager # [phone] OR # [phone] if no answer    Attending note: I have seen and examined the patient with nurse practitioner/resident and agree with the note. History, labs and imaging reviewed.  Attending note: I have seen and examined the patient with nurse practitioner/resident and agree with the note. History, labs and imaging reviewed.  50 Y/O with multiple medical problems. Repeated admission with altered mental status, meningitis, PNA. Now readmitted with AMS, sepsis, presumed PNA Extubated today AM and he is doing well.  Continue supportive care.  Finished Abx Weaned down hydrocortisone to 50 mg q12 today. He will need a slow wean as he is adrenally insufficient and becomes hypotensive when tapered rapidly.  Rest of plan as above. Critical care time- 35 mins. This represents my time independent of the NPs time taking care of the patient.  Chilton Greathouse MD [facility] Pager [phone] If no answer or after 3pm call: [phone] 06/24/2016, 10:58 AM

## 2016-06-24 NOTE — Progress Notes (Signed)
eLink Physician-Brief Progress Note Patient Name: Bonnita HollowCraig D Ellenburg DOB: 03/16/1966 MRN: [id number]   Date of Service  06/24/2016  HPI/Events of Note  Hypoglycemia  eICU Interventions  Increase IVF rate with D5 to 30 cc/hr     Intervention Category Intermediate Interventions: Other:  DETERDING,ELIZABETH 06/24/2016, 10:38 PM

## 2016-06-24 NOTE — Progress Notes (Addendum)
Subjective: Continues to remain more alert today per nurse but currently lethargic  Exam: Filed Vitals:   06/24/16 0800 06/24/16 0900  BP: 118/68 135/69  Pulse: 43 56  Temp: 96.4 F (35.8 C)   Resp: 11 13    Neuro: MS: continues to follows commands with only mild delay. . CN: PERRL, eyes cross midline bilaterally Motor: f/c in all 4 ext. But more delayed on the right  Sensory:responds to stim x 4 but again more delayed on the right   Pertinent Labs/Diagnostics: 24 hour EEG performed 7/19-7/20: This EEG is consistent with non-specific mild diffuse encephalopathy. No electrographic seizures were seen.  Felicie MornDavid Smith PA-C [facility] [phone]   Impression: 50 yo M with long course of recurrent AMS/seizures with a history of strep pneumo meningitis earlier this year. He has been slow to wake up, but does continue to be improving. EEG showed no epileptiform activity. I suspect that he has a multifactorial encephalopathy that is gradually improving and the fact that we're seeing day by day improvement is reassuring. I would continue current therapy.   Recommendations: 1) Continue Keppra and Depakote 2) Will continue to follow  Ritta SlotMcNeill Riad Wagley, MD [facility] [phone]  If 7pm- 7am, please page neurology on call as listed in AMION.  06/24/2016, 1:22 PM

## 2016-06-24 NOTE — Progress Notes (Signed)
Hypoglycemic Event  CBG: 59  Treatment: D50 IV 50 mL  Symptoms: None  Follow-up CBG: Time:2100 CBG Result:133  Possible Reasons for Event: Inadequate meal intake  Comments/MD notified:n/a    Dale [name], Dale [name] M

## 2016-06-25 ENCOUNTER — Inpatient Hospital Stay (HOSPITAL_COMMUNITY): Payer: Medicaid Other

## 2016-06-25 LAB — CBC
HCT: 22.9 % — ABNORMAL LOW (ref 39.0–52.0)
Hemoglobin: 7.3 g/dL — ABNORMAL LOW (ref 13.0–17.0)
MCH: 28 pg (ref 26.0–34.0)
MCHC: 31.9 g/dL (ref 30.0–36.0)
MCV: 87.7 fL (ref 78.0–100.0)
PLATELETS: 139 10*3/uL — AB (ref 150–400)
RBC: 2.61 MIL/uL — ABNORMAL LOW (ref 4.22–5.81)
RDW: 16.4 % — AB (ref 11.5–15.5)
WBC: 13.2 10*3/uL — ABNORMAL HIGH (ref 4.0–10.5)

## 2016-06-25 LAB — GLUCOSE, CAPILLARY
GLUCOSE-CAPILLARY: 62 mg/dL — AB (ref 65–99)
GLUCOSE-CAPILLARY: 90 mg/dL (ref 65–99)
GLUCOSE-CAPILLARY: 82 mg/dL (ref 65–99)
GLUCOSE-CAPILLARY: 81 mg/dL (ref 65–99)
Glucose-Capillary: 71 mg/dL (ref 65–99)
Glucose-Capillary: 81 mg/dL (ref 65–99)
Glucose-Capillary: 88 mg/dL (ref 65–99)

## 2016-06-25 LAB — URINALYSIS, ROUTINE W REFLEX MICROSCOPIC
BILIRUBIN URINE: NEGATIVE
Glucose, UA: NEGATIVE mg/dL
HGB URINE DIPSTICK: NEGATIVE
KETONES UR: NEGATIVE mg/dL
Leukocytes, UA: NEGATIVE
NITRITE: NEGATIVE
PH: 6.5 (ref 5.0–8.0)
Protein, ur: NEGATIVE mg/dL
SPECIFIC GRAVITY, URINE: 1.01 (ref 1.005–1.030)

## 2016-06-25 LAB — BASIC METABOLIC PANEL
Anion gap: 7 (ref 5–15)
BUN: 18 mg/dL (ref 6–20)
CALCIUM: 8 mg/dL — AB (ref 8.9–10.3)
CO2: 28 mmol/L (ref 22–32)
CREATININE: 1.12 mg/dL (ref 0.61–1.24)
Chloride: 111 mmol/L (ref 101–111)
GFR calc non Af Amer: >60 mL/min (ref >60)
Glucose, Bld: 84 mg/dL (ref 65–99)
Potassium: 3.8 mmol/L (ref 3.5–5.1)
SODIUM: 146 mmol/L — AB (ref 135–145)

## 2016-06-25 LAB — MAGNESIUM: Magnesium: 1.8 mg/dL (ref 1.7–2.4)

## 2016-06-25 MED ORDER — VANCOMYCIN HCL IN DEXTROSE 750-5 MG/150ML-% IV SOLN
750.0000 mg | Freq: Two times a day (BID) | INTRAVENOUS | Status: DC
  Filled 2016-06-25 (×2): qty 150

## 2016-06-25 MED ORDER — DEXTROSE 50 % IV SOLN
INTRAVENOUS | Status: AC
  Filled 2016-06-25: qty 50

## 2016-06-25 MED ORDER — PIPERACILLIN-TAZOBACTAM 3.375 G IVPB
3.3750 g | Freq: Three times a day (TID) | INTRAVENOUS | Status: DC
Start: 2016-06-25 — End: 2016-06-26
  Administered 2016-06-25 – 2016-06-26 (×2): 3.375 g via INTRAVENOUS
  Filled 2016-06-25 (×4): qty 50

## 2016-06-25 MED ORDER — DEXTROSE 50 % IV SOLN
1.0000 | Freq: Once | INTRAVENOUS | Status: AC
  Administered 2016-06-25: 50 mL via INTRAVENOUS

## 2016-06-25 MED ORDER — VANCOMYCIN HCL IN DEXTROSE 1-5 GM/200ML-% IV SOLN
1000.0000 mg | Freq: Two times a day (BID) | INTRAVENOUS | Status: DC
  Administered 2016-06-25: 1000 mg via INTRAVENOUS
  Filled 2016-06-25 (×3): qty 200

## 2016-06-25 MED ORDER — SODIUM CHLORIDE 0.9 % IV BOLUS (SEPSIS)
500.0000 mL | Freq: Once | INTRAVENOUS | Status: AC
  Administered 2016-06-25: 500 mL via INTRAVENOUS

## 2016-06-25 NOTE — Progress Notes (Signed)
Pharmacy Antibiotic Note  Dale [name] is a 50 y.o. male admitted on 06/16/2016 with sepsis.  Pharmacy has been consulted to restart Vancomycin and Zosyn.  Plan: Vancomycin 1000 mg iv Q 12 hours Zosyn 3.375 grams iv Q 8 hours - 4 hr infusion  Height: 5\' 8"  (172.7 cm) Weight: 159 lb 2.8 oz (72.2 kg) IBW/kg (Calculated) : 68.4  Temp (24hrs), Avg:97 F (36.1 C), Min:93.9 F (34.4 C), Max:103 F (39.4 C)   Recent Labs Lab 06/18/16 1907  06/20/16 0508  06/21/16 1830 06/22/16 0520 06/23/16 0535 06/24/16 0510 06/25/16 0525  WBC  --   < > 13.3*  --   --  8.3 7.9 9.3 13.2*  CREATININE  --   < > 0.90  < > 1.05 1.03 0.84 0.84 1.12  VANCOTROUGH 10*  --   --   --   --   --   --   --   --   < > = values in this interval not displayed.  Estimated Creatinine Clearance: 75.5 mL/min (by C-G formula based on Cr of 1.12).    No Known Allergies   Thank you for allowing pharmacy to be a part of this patient's care. Okey Regal, PharmD [phone]  06/25/2016 7:02 PM

## 2016-06-25 NOTE — Evaluation (Signed)
Physical Therapy Evaluation Patient Details Name: Dale [name] MRN: [id number] DOB: [birth year] Today's Date: 06/25/2016   History of Present Illness  Patient is a 50 y/o male with hx of pneumonia meningitis, cardiac arrest, dementia, NSTEMI, seizures, CHF, HLD, and cerebral septic emboli with multiple recent admissions (one every month starting in March) and in and out of a SNF presents from [place] with AMS. Found to be in septic shock due to HCAP and seizures. Intubated on arrival 7/13-7/21.  Clinical Impression  Patient presents with confusion, generalized weakness, deconditioning, and impaired balance s/p above. Tolerated standing and taking a few steps along side the best with assist of 2 for support due to weakness. Pt with bradycardia throughout session in high 40s-upper 50 pm. Pt minimally verbal, low toned and difficult to understand due to mumbling. Follows simple 1 step commands with increased time and cues. Fatigues easily and requires verbal cues to stay attended to task. Will follow acutely to maximize independence and mobility prior to return to short term SNF.     Follow Up Recommendations SNF    Equipment Recommendations  Other (comment) (TBD at next venue)    Recommendations for Other Services       Precautions / Restrictions Precautions Precautions: Fall Restrictions Weight Bearing Restrictions: No      Mobility  Bed Mobility Overal bed mobility: Needs Assistance;+2 for physical assistance;+ 2 for safety/equipment Bed Mobility: Supine to Sit;Sit to Supine     Supine to sit: Mod assist;+2 for physical assistance;+2 for safety/equipment;HOB elevated Sit to supine: Total assist;+2 for physical assistance;HOB elevated   General bed mobility comments: Able to initiate movement LEs with cueing but needed assist. Total A to return to supine - fatigued.  Transfers Overall transfer level: Needs assistance Equipment used: 2 person hand held assist Transfers:  Sit to/from Stand Sit to Stand: Mod assist;+2 physical assistance         General transfer comment: Assist of 2 to boost to standing with increased trunk/hip/knee flexion. Cues for upright however pt with difficulty.   Ambulation/Gait Ambulation/Gait assistance: Max assist;+2 physical assistance Ambulation Distance (Feet): 2 Feet Assistive device: 2 person hand held assist Gait Pattern/deviations: Step-to pattern;Shuffle;Narrow base of support;Trunk flexed;Decreased dorsiflexion - right;Decreased dorsiflexion - left     General Gait Details: Able to take side step to the left with assist for weight shifting and to advance lead foot. Bil knee instability and buckling.   Stairs            Wheelchair Mobility    Modified Rankin (Stroke Patients Only)       Balance Overall balance assessment: Needs assistance Sitting-balance support: Feet supported;No upper extremity supported Sitting balance-Leahy Scale: Poor Sitting balance - Comments: Variable assist needed sitting EOB ranging from min gaurd-Mod A. Pushing posteriorly at times, able to self correct and initiate forward lean at times - inconsistent.   Standing balance support: During functional activity Standing balance-Leahy Scale: Poor Standing balance comment: Able to stand ~1-2 minutes with assist of 2                             Pertinent Vitals/Pain Pain Assessment: Faces Faces Pain Scale: No hurt    Home Living Family/patient expects to be discharged to:: Skilled nursing facility                      Prior Function     Gait / Transfers Assistance  Needed: Per chart and old notes pt was ambulatory.     Comments: PLOF is unknown, no family present.      Hand Dominance        Extremity/Trunk Assessment   Upper Extremity Assessment: Defer to OT evaluation           Lower Extremity Assessment: Generalized weakness;Difficult to assess due to impaired cognition RLE Deficits /  Details: Able to perform LAQ BLEs and marching but limited AROM throughout. MMT not performed however weakness noted esp with standing with bil knee flexion. LLE Deficits / Details: Able to perform LAQ BLEs and marching but limited AROM throughout. MMT not performed however weakness noted esp with standing with bil knee flexion.     Communication   Communication:  (low toned voice, lots of mumbling. Minimal verbalizations)  Cognition Arousal/Alertness: Lethargic Behavior During Therapy: Flat affect Overall Cognitive Status: No family/caregiver present to determine baseline cognitive functioning   Orientation Level: Disoriented to;Place;Time;Situation Current Attention Level: Sustained Memory: Decreased short-term memory Following Commands: Follows one step commands inconsistently;Follows one step commands with increased time Safety/Judgement: Decreased awareness of deficits;Decreased awareness of safety Awareness:  (pre-intellectual) Problem Solving: Slow processing;Decreased initiation;Requires verbal cues;Requires tactile cues;Difficulty sequencing General Comments: Cues to stay alert to task    General Comments      Exercises        Assessment/Plan    PT Assessment Patient needs continued PT services  PT Diagnosis Generalized weakness;Altered mental status;Difficulty walking   PT Problem List Decreased strength;Decreased activity tolerance;Decreased balance;Decreased mobility;Decreased cognition  PT Treatment Interventions DME instruction;Gait training;Functional mobility training;Therapeutic activities;Therapeutic exercise;Balance training;Cognitive remediation;Patient/family education   PT Goals (Current goals can be found in the Care Plan section) Acute Rehab PT Goals Patient Stated Goal: Pt unable to state due to confusion PT Goal Formulation: Patient unable to participate in goal setting Time For Goal Achievement: 07/09/16 Potential to Achieve Goals: Good    Frequency  Min 2X/week   Barriers to discharge        Co-evaluation               End of Session Equipment Utilized During Treatment: Gait belt Activity Tolerance: Patient limited by fatigue Patient left: in bed;with call bell/phone within reach;with bed alarm set;with SCD's reapplied Nurse Communication: Mobility status         Time: 9728-2060 PT Time Calculation (min) (ACUTE ONLY): 27 min   Charges:   PT Evaluation $PT Eval Moderate Complexity: 1 Procedure PT Treatments $Therapeutic Activity: 8-22 mins   PT G Codes:        Lynnie Koehler A Julietta Batterman 06/25/2016, 12:54 PM Mylo Red, PT, DPT [phone]

## 2016-06-25 NOTE — Evaluation (Signed)
Clinical/Bedside Swallow Evaluation Patient Details  Name: Dale [name] MRN: [id number] Date of Birth: [birth year]  Today's Date: 06/25/2016 Time: SLP Start Time (ACUTE ONLY): 0855 SLP Stop Time (ACUTE ONLY): 0925 SLP Time Calculation (min) (ACUTE ONLY): 30 min  Past Medical History:  Past Medical History  Diagnosis Date  . Depression   . Cardiac arrest (HCC)   . Cardiogenic shock (HCC)   . Acute respiratory failure with hypoxia (HCC)   . Meningitis, streptococcal 02/16/2016  . 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 Tobacco abuse 02/16/2016  . Substance abuse   . ETOH abuse   . Septic shock (HCC)   . Urinary retention 05/04/2016    Urinary retention - Continue Flomax     . Systolic and diastolic CHF, chronic (HCC)   . 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 Severe sepsis (HCC)   . Respiratory failure (HCC) 03/03/2016  . Protein-calorie malnutrition, severe (HCC)   . NSTEMI (non-ST elevated myocardial infarction) (HCC) 03/14/2016  . Meningoencephalitis   . Meningitis, pneumococcal, recurrent   . Low serum cortisol level (HCC) 05/04/2016    Low a.m. cortisol  - Was 1.2 checked on 04/03/2016  - This likely is reflective of cortisol suppression from steroid use - Tapered steroids off   . Hypothermia 03/20/2016  . Hypernatremia   . Hyperlipidemia 05/04/2016    Dyslipidemia - Continue statin.     Marland Kitchen Hydrocephalus   . Dysphagia 05/04/2016    Dysphagia - Dysphagia 1 diet per SLP     . Dementia 04/27/2016  . Convulsions/seizures (HCC) 05/04/2016    Seizure disorder - Continue Keppra, Depakote.     . CHF (congestive heart failure) (HCC)   . Cerebral thrombosis with cerebral infarction 03/25/2016  . Cerebral septic emboli (HCC)   . Altered mental status   . AKI (acute kidney injury) (HCC) 02/16/2016  . Acute renal failure (HCC)   . Acute ischemic stroke (HCC)   . Acute encephalopathy    Past Surgical History:  Past Surgical History  Procedure Laterality Date  . Ankle surgery    .  Cardiac catheterization N/A 02/17/2016    Procedure: IABP Insertion;  Surgeon: Iran Ouch, MD;  Location: [facility] INVASIVE CV LAB;  Service: Cardiovascular;  Laterality: N/A;  . Tee without cardioversion N/A 03/29/2016    Procedure: TRANSESOPHAGEAL ECHOCARDIOGRAM (TEE);  Surgeon: Laurey Morale, MD;  Location: [facility] ENDOSCOPY;  Service: Cardiovascular;  Laterality: N/A;   HPI:  50 year old male with h/o strep pneumonia meningitis, dysphagia, dementia and hospital course complicated by cardiac arrest and cardiogenic shock in March 2016, ARF, shock liver, adrenal insufficiency presents with acute encephalopathy. SLP evaluations during previous admissions have recommended Dys 1 diet and nectar thick liquids with advancement to Dys 2 textures prior to discharge. Pt is pending possible PEG placement.   Assessment / Plan / Recommendation Clinical Impression  Pt had immediate coughing with spoon size thin liquid. He tolerated nectar thick juice by spoon and cup and pureed with no s/s of aspiraiton. Due to lethargy and pt being edematous, solids were not tested. Pt was able to follow simple commands but did not initiate self feeding. Pt will require full assistance with feeding due to lethargy and cognition. Recommend a Dys 1, nectar thick liquid diet. Speech will follow up for diet tolerance and upgrades.     Aspiration Risk  Moderate aspiration risk    Diet Recommendation Dysphagia 1 (Puree);Nectar-thick liquid   Liquid Administration via: Cup Medication Administration: Whole meds with  puree Supervision: Full supervision/cueing for compensatory strategies Compensations: Slow rate;Small sips/bites;Minimize environmental distractions Postural Changes: Seated upright at 90 degrees;Remain upright for at least 30 minutes after po intake    Other  Recommendations Oral Care Recommendations: Oral care BID Other Recommendations: Order thickener from pharmacy;Prohibited food (jello, ice cream, thin  soups);Remove water pitcher   Follow up Recommendations  Skilled Nursing facility    Frequency and Duration min 2x/week  2 weeks       Prognosis Prognosis for Safe Diet Advancement: Fair Barriers to Reach Goals: Cognitive deficits      Swallow Study   General Date of Onset: 06/16/16 HPI: 50 year old male with h/o strep pneumonia meningitis, dysphagia, dementia and hospital course complicated by cardiac arrest and cardiogenic shock in March 2016, ARF, shock liver, adrenal insufficiency presents with acute encephalopathy. SLP evaluations during previous admissions have recommended Dys 1 diet and nectar thick liquids with advancement to Dys 2 textures prior to discharge. Pt is pending possible PEG placement. Type of Study: Bedside Swallow Evaluation Previous Swallow Assessment: 05/31/16 Diet Prior to this Study: Dysphagia 1 (puree);Nectar-thick liquids Temperature Spikes Noted: Yes Respiratory Status: Room air History of Recent Intubation: Yes Length of Intubations (days): 7 days Date extubated: 06/24/16 Behavior/Cognition: Cooperative;Lethargic/Drowsy;Pleasant mood Oral Cavity Assessment: Within Functional Limits Oral Care Completed by SLP: No Oral Cavity - Dentition: Edentulous Vision: Functional for self-feeding Self-Feeding Abilities: Total assist Patient Positioning: Upright in bed Baseline Vocal Quality: Low vocal intensity Volitional Cough: Weak Volitional Swallow: Able to elicit    Oral/Motor/Sensory Function Overall Oral Motor/Sensory Function: Within functional limits   Ice Chips Ice chips: Not tested   Thin Liquid Thin Liquid: Impaired Presentation: Spoon Oral Phase Impairments: Poor awareness of bolus Pharyngeal  Phase Impairments: Suspected delayed Swallow;Cough - Immediate    Nectar Thick Nectar Thick Liquid: Within functional limits Presentation: Cup;Spoon Pharyngeal Phase Impairments: Suspected delayed Swallow   Honey Thick Honey Thick Liquid: Not tested    Puree Puree: Within functional limits Presentation: Spoon Pharyngeal Phase Impairments: Suspected delayed Swallow   Solid   GO   Solid: Not tested    Functional Assessment Tool Used: Bedside Swallow Evaluation Functional Limitations: Swallowing Swallow Current Status (T1572): At least 40 percent but less than 60 percent impaired, limited or restricted Swallow Goal Status [phone]): At least 20 percent but less than 40 percent impaired, limited or restricted   Lindalou Hose Aune Adami, MA, CCC-SLP 06/25/2016 9:30 AM

## 2016-06-25 NOTE — Progress Notes (Signed)
Called eLink and spoke to Dr. Vaughan Basta. MD was notified that pts temp has dropped and is continuing to drop despite having bear hugger on.   Nurse will continue to monitor.  Vanessa Ralphs, RN

## 2016-06-25 NOTE — Progress Notes (Signed)
Subjective: More alert today.  Exam: Filed Vitals:   06/25/16 0900 06/25/16 1100  BP: 95/54 96/59  Pulse: 50 42  Temp:    Resp: 15 11    HEENT-  Normocephalic, no lesions, without obvious abnormality.  Cardiovascular- S1, S2 normal, pulses palpable throughout   Lungs- chest clear, no wheezing, rales, normal symmetric air entry Abdomen- normal findings: bowel sounds normal Extremities- less then 2 second capillary refill Lymph-no adenopathy palpable Musculoskeletal-no joint tenderness, deformity or swelling Skin-dry    Gen: In bed, NAD MS: awake, oriented to person, follows commands with mild delay CN: PEERL, eyes cross midline bilaterally Motor: follows commands with all 4 extremities, weak throughout Sensory: responds to noxious stimulus x4   Pertinent Labs/Diagnostics: WBC 13.2 Hbg 7.3  Ladawn Boullion, MSN, RN Neurology [phone]  Impression: 50 yo M with long course of recurrent AMS/seizures with a history of strep pneumo meningitis earlier this year. He has been making slow but steady progress in waking up. Continue current therapies.  Recommendations: 1) Continue Keppra and Depakote 2) Will continue to follow    06/25/2016, 11:51 AM

## 2016-06-25 NOTE — Progress Notes (Signed)
eLink Physician-Brief Progress Note Patient Name: Dale [name] DOB: Dec 11, 1965 MRN: [id number]   Date of Service  06/25/2016  HPI/Events of Note  Hypothermia - Temp = 93.9 rectal in spite of Bair Hugger.  Clinical behavior suspecious for sepsis.   eICU Interventions  Will order: 1. Blood cultures X 2 now. 2. UA now.  3. Sputum Cx now. 4. Vancomycin and Zosyn per pharmacy consultation.      Intervention Category Intermediate Interventions: Other:  Lenell Antu 06/25/2016, 6:54 PM

## 2016-06-25 NOTE — Progress Notes (Addendum)
PULMONARY / CRITICAL CARE MEDICINE   Name: Dale [name] MRN: [id number] DOB: 11-29-1966    ADMISSION DATE:  06/16/2016 CONSULTATION DATE:  06/16/2016  REFERRING MD:  EDP Dr Effie Shy  CHIEF COMPLAINT:  AMS  HISTORY OF PRESENT ILLNESS:   50 year old male with complex medical history including:  3/14-4/6: Admitted with strep pneumo meningitis, c/b cardiac arrest, and cardiogenic shock (EF 20%, recovered to 50%) briefly on amiodarone, ARDS requiring prolonged intubation and trach (now removed), ARF, shock liver, and adrenal insufficiency. He was discharged on 4/6 with weekly Pen G IM until 4/10. Has been re-admitted several times w/ recurrent infection and seen by ID.  Re-admitted 7/13 again presenting with AMS thought to be septic/PNA in origin but had witnessed seizure in ED. He was found to be hypotensive and hypothermic, however WBC and Lactic were wnl. CXR with LLL opacification He required CVL placement and vasopressors for shock despite aggressive IVF resuscitation.  He will need ICU admission for shock treatment and continuous EEG. He was intubated in the ED for airway protection and transferred to [facility]. At tiime of pccm MD eval - intubated, has femoral line and on levophed  SUBJECTIVE: extubated, denies pain, had an episode last night with difficulty clearing secretions.   VITAL SIGNS: BP 81/57 mmHg  Pulse 62  Temp(Src) 97.2 F (36.2 C) (Oral)  Resp 13  Ht 5\' 8"  (1.727 m)  Wt 72.2 kg (159 lb 2.8 oz)  BMI 24.21 kg/m2  SpO2 94%  HEMODYNAMICS: CVP:  [0 mmHg-3 mmHg] 2 mmHg  VENTILATOR SETTINGS: Vent Mode:  [-]  FiO2 (%):  [30 %] 30 %  INTAKE / OUTPUT: I/O last 3 completed shifts: In: 3551.3 [I.V.:1361.3; NG/GT:715; IV Piggyback:1475] Out: 2000 [Urine:2000]  PHYSICAL EXAMINATION: General:  chronically ill looking male. Comfortable resp pattern Neuro:  RASS 0. Will interact slowly and speak single words. Followed commands, all Ext HEENT:  Intubated. OGT, PERL  pinpoint Cardiovascular:  Normal heart sounds Lungs:  resps even non labored on vent, diffuse  Rhonchi, excellent F/Vt  Abdomen:  soft Musculoskeletal:  No cyanosis. No clubbing   LABS: PULMONARY No results for input(s): PHART, PCO2ART, PO2ART, HCO3, TCO2, O2SAT in the last 168 hours.  Invalid input(s): PCO2, PO2  CBC  Recent Labs Lab 06/23/16 0535 06/24/16 0510 06/25/16 0525  HGB 7.5* 7.7* 7.3*  HCT 23.2* 23.3* 22.9*  WBC 7.9 9.3 13.2*  PLT 95* 117* 139*    COAGULATION No results for input(s): INR in the last 168 hours.  CARDIAC   No results for input(s): TROPONINI in the last 168 hours. No results for input(s): PROBNP in the last 168 hours.   CHEMISTRY  Recent Labs Lab 06/19/16 0400 06/19/16 1700  06/21/16 0530 06/21/16 1830 06/22/16 0520 06/23/16 0535 06/24/16 0510 06/25/16 0525  NA 144  --   < > 146* 146* 146* 144 146* 146*  K 3.0*  --   < > 2.9* 3.5 3.1* 3.6 3.5 3.8  CL 115*  --   < > 116* 117* 115* 113* 111 111  CO2 21*  --   < > 24 25 24 26 28 28   GLUCOSE 97  --   < > 107* 80 88 102* 117* 84  BUN 13  --   < > 13 12 14 16 17 18   CREATININE 1.01  --   < > 0.82 1.05 1.03 0.84 0.84 1.12  CALCIUM 8.3*  --   < > 8.2* 7.7* 7.9* 7.9* 8.0* 8.0*  MG 1.9 1.9  --  1.9  --   --  1.8 2.0 1.8  PHOS 3.6 2.7  --  1.9*  --   --  4.3 3.9  --   < > = values in this interval not displayed. Estimated Creatinine Clearance: 75.5 mL/min (by C-G formula based on Cr of 1.12).   LIVER  Recent Labs Lab 06/23/16 0535  ALBUMIN 1.8*     INFECTIOUS  Recent Labs Lab 06/19/16 0400  PROCALCITON 0.10     ENDOCRINE CBG (last 3)   Recent Labs  06/24/16 2322 06/25/16 0322 06/25/16 0750  GLUCAP 77 81 62*     No results found. EVENTS: 7/13 - Admit w/ septic shock & seizure 7/21 ready for extubation  MICROBIOLOGY: Blood Ctx x2 7/13:  Negative  Urine Ctx 7/13:  Negative  MRSA PCR 7/14:  Negative  ANTIBIOTICS: Vancomycin 7/13 - 7/17 Zosyn 7/13 -  7/20  STUDIES:  CT head 7/14: New areas of subacute to chronic ischemia on the left. Progression in the degree of sinusitis as described. EEG 7/14: This was an abnormal EEG due to the presence of moderate background slowing. This slowing is non-specific and is indicative of encephalopathy. Port CXR 7/16:  L IJ CVL with tip over SVC. Enteric tube below hemidiaphragm. ETT above carina.  MRI  7/18: 1. Normal expected interval evolution of previously identified infarct involving the left basal ganglia/corona radiata. Additionally, previously seen signal abnormalities within the bilateral basal ganglia have also largely resolved relative to previous MRI from 03/24/2016. Similarly, changes at the right parietal cortex have also evolved. No new areas of ischemia or injury identified.  2. Persistent layering debris/hemorrhage within the lateral ventricles, likely related to recent history of meningitis. No Hydrocephalus. Port CXR 7/19: Left basilar opacity unchanged. Endotracheal tube needs to be advanced 2 cm. Continuous EEG 7/20:  Mild diffuse encephalopathy. No electrographic seizures.  LINES/TUBES: OETT 7.5 7/13>> 7/21 L IJ CVL 07/13>> OGT 7/13>> 7/21 Foley 7/19>>   DISCUSSION: 50 y.o. M with complex PMH including recent admissions for strep pneumo meningitis c/b cardiac arrest and cardiogenic shock (March 2017).  Had trach at the time but has since been decannulated.  Now resides at [facility] and was admitted again 07/13 with septic shock shock due to PNA as well as seizures.  He required intubation in ED for airway protection. Now ready for extubation   ASSESSMENT / PLAN:  NEUROLOGIC A:   Acute Encephalopathy - ongoing.  Seems to be slowly improving.  Seizures. New Subacute Ischemia - Seen on CT scan. H/O strep pneumo meningitis, hydrocephalus, CVA.  P:   RASS goal: 0  AED per Neuro:  Keppra & Depacon Ativan IV prn seizure Neurology Following & appreciate recommendations Thiamine & FA  VT daily  INFECTIOUS A:   Sepsis - Possible HCAP-->completed abx course  Possible HCAP H/O strep pneumo meningitis (march 2017).  P:   Following cultures to completion Trend CBC/ Fever Curve  PULMONARY A: Acute Hypoxic Respiratory Failure Unable to Protect Airway - In setting of seizures. HCAP H/O Tracheostomy March 2017 - S/P Decannulation.  P:   Mobilize pulm hygiene   CARDIOVASCULAR A:  Shock - Resolved. Possibly secondary to sepsis. Bradycardia resolving H/O Cardiac Arrest w/ NSTEMI H/O Systolic CHF - EF 45 - 50% per echo from June 2017 H/O Hyperlipidemia  P:  Continue Telemetry monitoring  Vitals per Unit Protocol Continue outpatient ASA & atorvastatin.  RENAL A:   Hypokalemia - Replacing. Hyperchloremia - Mild. Pseudohypocalcemia -  corrects to 9.0 7/14 Urinary retention - Foley replaced.  P:   Trending UOP with Foley Bethanechol started 7/18 KVO IVFs Oral intake should correct Na after extubation Monitoring electrolytes & renal function daily Replacing electrolytes as indicated  GASTROINTESTINAL A:   No acute issues. P:   Start diet when passes swallow eval  HEMATOLOGIC A:   Anemia - Chronic. No signs of active bleeding. Hgb stable. Leukocytosis - Resolved. Thrombocytopenia - Mild.   P:  Transfuse for Hgb < 7. SCD's  Trending cell counts daily w/ CBC  ENDOCRINE A:   Risk of Hyperglycemia - On stress steroids. Adrenal Insufficiency - Cortisol 4.3.  P:   Taper steroids (decreased to q12 7/21) SSI per Standard Algorithm Accu-Checks q4hr  FAMILY  - Updates: none at bedside 7/20  - Inter-disciplinary family meet or Palliative Care meeting due by:  06/23/16   Levy Pupa, MD, PhD 06/25/2016, 8:33 AM [facility] [phone] or if no answer [phone]

## 2016-06-26 DIAGNOSIS — L8990 Pressure ulcer of unspecified site, unspecified stage: Secondary | ICD-10-CM | POA: Insufficient documentation

## 2016-06-26 LAB — GLUCOSE, CAPILLARY
GLUCOSE-CAPILLARY: 95 mg/dL (ref 65–99)
GLUCOSE-CAPILLARY: 91 mg/dL (ref 65–99)
GLUCOSE-CAPILLARY: 84 mg/dL (ref 65–99)
Glucose-Capillary: 123 mg/dL — ABNORMAL HIGH (ref 65–99)
Glucose-Capillary: 95 mg/dL (ref 65–99)

## 2016-06-26 LAB — BASIC METABOLIC PANEL
Anion gap: 4 — ABNORMAL LOW (ref 5–15)
BUN: 17 mg/dL (ref 6–20)
CALCIUM: 8.3 mg/dL — AB (ref 8.9–10.3)
CHLORIDE: 110 mmol/L (ref 101–111)
CO2: 32 mmol/L (ref 22–32)
CREATININE: 0.99 mg/dL (ref 0.61–1.24)
GFR calc non Af Amer: >60 mL/min (ref >60)
Glucose, Bld: 97 mg/dL (ref 65–99)
Potassium: 3.8 mmol/L (ref 3.5–5.1)
SODIUM: 146 mmol/L — AB (ref 135–145)

## 2016-06-26 MED ORDER — INSULIN ASPART 100 UNIT/ML ~~LOC~~ SOLN: 0.0000 [IU] | Freq: Three times a day (TID) | SUBCUTANEOUS | Status: DC

## 2016-06-26 MED ORDER — INSULIN ASPART 100 UNIT/ML ~~LOC~~ SOLN: 0.0000 [IU] | Freq: Every day | SUBCUTANEOUS | Status: DC

## 2016-06-26 MED ORDER — HYDROCORTISONE NA SUCCINATE PF 100 MG IJ SOLR
50.0000 mg | Freq: Every day | INTRAMUSCULAR | Status: DC
  Administered 2016-06-27 – 2016-06-28 (×2): 50 mg via INTRAVENOUS
  Filled 2016-06-26 (×2): qty 2

## 2016-06-26 MED ORDER — CHLORHEXIDINE GLUCONATE 0.12 % MT SOLN
OROMUCOSAL | Status: AC
  Administered 2016-06-26: 15 mL via OROMUCOSAL
  Filled 2016-06-26: qty 15

## 2016-06-26 NOTE — Clinical Social Work Note (Signed)
Chart review indicates that patient remains in ICU and has been extubated.  Message left for [facility]- Admissions at [facility] re: above. SW services will continue to monitor for possible stability and return to [facility].  Lorri Frederick. Jaci Lazier, LCSW [phone]  (weekend coverage)

## 2016-06-26 NOTE — Progress Notes (Signed)
Subjective: More alert today.  Smiles with interactions.   Exam: Vitals:   06/26/16 0700 06/26/16 0800  BP: 124/70   Pulse: (!) 47   Resp: (!) 6   Temp:  97.7 F (36.5 C)    HEENT-  Normocephalic, no lesions, without obvious abnormality.  Cardiovascular- S1, S2 normal, pulses palpable throughout   Lungs- chest clear, no wheezing, rales, normal symmetric air entry Abdomen- normal findings: bowel sounds normal Extremities- less then 2 second capillary refill Lymph-no adenopathy palpable Musculoskeletal-no joint tenderness, deformity or swelling Skin-dry    Gen: In bed, NAD MS: awake, oriented to person, follows commands with mild delay CN: PEERL, eyes cross midline bilaterally Motor: follows commands with all 4 extremities, weak throughout Sensory: responds to noxious stimulus   Pertinent Labs/Diagnostics: None  Nyx Keady, MSN, RN Neurology [phone]  Impression: 50 yo M with long course of recurrent AMS/seizures with a history of strep pneumo meningitis earlier this year. He has been making slow but steady progress in waking up. Continue current therapies.  Recommendations: 1) Continue Keppra and Depakote 2) Will continue to follow   06/26/2016, 10:33 AM

## 2016-06-26 NOTE — Progress Notes (Signed)
PULMONARY / CRITICAL CARE MEDICINE   Name: Dale [name] MRN: [id number] DOB: 1966-10-14    ADMISSION DATE:  06/16/2016 CONSULTATION DATE:  06/16/2016  REFERRING MD:  EDP Dr Effie Shy  CHIEF COMPLAINT:  AMS  HISTORY OF PRESENT ILLNESS:   50 year old male with complex medical history including:  3/14-4/6: Admitted with strep pneumo meningitis, c/b cardiac arrest, and cardiogenic shock (EF 20%, recovered to 50%) briefly on amiodarone, ARDS requiring prolonged intubation and trach (now removed), ARF, shock liver, and adrenal insufficiency. He was discharged on 4/6 with weekly Pen G IM until 4/10. Has been re-admitted several times w/ recurrent infection and seen by ID.  Re-admitted 7/13 again presenting with AMS thought to be septic/PNA in origin but had witnessed seizure in ED. He was found to be hypotensive and hypothermic, however WBC and Lactic were wnl. CXR with LLL opacification He required CVL placement and vasopressors for shock despite aggressive IVF resuscitation. ICU admission for shock treatment and continuous EEG. He was intubated in the ED for airway protection and transferred to [facility]. Extubated 7/21  SUBJECTIVE:  Has been hypothermic, placed on active warming last night and given empiric abx.    VITAL SIGNS: BP 124/70   Pulse (!) 47   Temp 97.5 F (36.4 C) (Axillary)   Resp (!) 6   Ht  (1.727 m)   Wt 68.9 kg (151 lb 14.4 oz)   SpO2 99%   BMI 23.10 kg/m   HEMODYNAMICS:    VENTILATOR SETTINGS:    INTAKE / OUTPUT: I/O last 3 completed shifts: In: 2751.8 [I.V.:1434.3; IV Piggyback:1317.5] Out: 2810 [Urine:2510; Stool:300]  PHYSICAL EXAMINATION: General:  chronically ill looking male. Comfortable resp pattern Neuro:  RASS 0. Will interact slowly and speak single words. Followed commands, all Ext HEENT:  Intubated. OGT, PERL pinpoint Cardiovascular:  Normal heart sounds Lungs:  resps even non labored on vent, diffuse  Rhonchi, excellent F/Vt  Abdomen:   soft Musculoskeletal:  No cyanosis. No clubbing   LABS: PULMONARY No results for input(s): PHART, PCO2ART, PO2ART, HCO3, TCO2, O2SAT in the last 168 hours.  Invalid input(s): PCO2, PO2  CBC  Recent Labs Lab 06/23/16 0535 06/24/16 0510 06/25/16 0525  HGB 7.5* 7.7* 7.3*  HCT 23.2* 23.3* 22.9*  WBC 7.9 9.3 13.2*  PLT 95* 117* 139*    COAGULATION No results for input(s): INR in the last 168 hours.  CARDIAC   No results for input(s): TROPONINI in the last 168 hours. No results for input(s): PROBNP in the last 168 hours.   CHEMISTRY  Recent Labs Lab 06/19/16 1700  06/21/16 0530  06/22/16 0520 06/23/16 0535 06/24/16 0510 06/25/16 0525 06/26/16 0537  NA  --   < > 146*  < > 146* 144 146* 146* 146*  K  --   < > 2.9*  < > 3.1* 3.6 3.5 3.8 3.8  CL  --   < > 116*  < > 115* 113* 111 111 110  CO2  --   < > 24  < > 32  GLUCOSE  --   < > 107*  < > 88 102* 117* 84 97  BUN  --   < > 13  < > CREATININE  --   < > 0.82  < > 1.03 0.84 0.84 1.12 0.99  CALCIUM  --   < > 8.2*  < > 7.9* 7.9* 8.0* 8.0* 8.3*  MG 1.9  --  1.9  --   --  1.8 2.0 1.8  --   PHOS 2.7  --  1.9*  --   --  4.3 3.9  --   --   < > = values in this interval not displayed. Estimated Creatinine Clearance: 85.4 mL/min (by C-G formula based on SCr of 0.99 mg/dL).   LIVER  Recent Labs Lab 06/23/16 0535  ALBUMIN 1.8*     INFECTIOUS No results for input(s): LATICACIDVEN, PROCALCITON in the last 168 hours.   ENDOCRINE CBG (last 3)   Recent Labs  06/25/16 2007 06/25/16 2345 06/26/16 0329  GLUCAP 81 71 95     Dg Chest Port 1 View  Result Date: 06/25/2016 CLINICAL DATA:  Pneumonia. EXAM: PORTABLE CHEST 1 VIEW COMPARISON:  06/22/2016. FINDINGS: Interval extubation. Left IJ central line tip projects over the SVC. Heart size normal. Left lower lobe collapse/ consolidation. Hazy opacification over the lower hemithoraces bilaterally, new on the right. IMPRESSION: 1. Left lower  lobe collapse/consolidation. 2. Hazy opacification over both lower hemithoraces may be due to layering pleural fluid and/or true airspace opacification. Electronically Signed   By: Leanna Battles M.D.   On: 06/25/2016 10:13   EVENTS: 7/13 - Admit w/ septic shock & seizure 7/21 ready for extubation  MICROBIOLOGY: Blood Ctx x2 7/13:  Negative  Urine Ctx 7/13:  Negative  MRSA PCR 7/14:  Negative  ANTIBIOTICS: Vancomycin 7/13 - 7/17; 7/22 >> 7/23 Zosyn 7/13 - 7/20; 7/22 >> 7/23  STUDIES:  CT head 7/14: New areas of subacute to chronic ischemia on the left. Progression in the degree of sinusitis as described. EEG 7/14: This was an abnormal EEG due to the presence of moderate background slowing. This slowing is non-specific and is indicative of encephalopathy. Port CXR 7/16:  L IJ CVL with tip over SVC. Enteric tube below hemidiaphragm. ETT above carina.  MRI  7/18: 1. Normal expected interval evolution of previously identified infarct involving the left basal ganglia/corona radiata. Additionally, previously seen signal abnormalities within the bilateral basal ganglia have also largely resolved relative to previous MRI from 03/24/2016. Similarly, changes at the right parietal cortex have also evolved. No new areas of ischemia or injury identified.  2. Persistent layering debris/hemorrhage within the lateral ventricles, likely related to recent history of meningitis. No Hydrocephalus. Port CXR 7/19: Left basilar opacity unchanged. Endotracheal tube needs to be advanced 2 cm. Continuous EEG 7/20:  Mild diffuse encephalopathy. No electrographic seizures.  LINES/TUBES: OETT 7.5 7/13>> 7/21 L IJ CVL 07/13>> OGT 7/13>> 7/21 Foley 7/19>>   DISCUSSION: 50 y.o. M with complex PMH including recent admissions for strep pneumo meningitis c/b cardiac arrest and cardiogenic shock (March 2017).  Had trach at the time but has since been decannulated.  Now resides at [facility] and was admitted again 07/13  with septic shock shock due to PNA as well as seizures.  He required intubation in ED for airway protection. Extubated 7/21  ASSESSMENT / PLAN:  NEUROLOGIC A:   Acute Encephalopathy - ongoing.  Seems to be slowly improving.  Seizures. New Subacute Ischemia - Seen on CT scan. H/O strep pneumo meningitis, hydrocephalus, CVA.  P:   RASS goal: 0  AED per Neuro:  Keppra & Depacon Ativan IV prn seizure Neurology Following & appreciate recommendations Thiamine & FA VT daily  INFECTIOUS A:   Sepsis - Possible HCAP-->completed abx course  Possible HCAP H/O strep pneumo meningitis (march 2017).  P:   Received repeat doses empiric vanco and zosyn 7/22 due  to hypothermia, cx's performed Will stop abx now, follow cxs and clinical status  PULMONARY A: Acute Hypoxic Respiratory Failure Unable to Protect Airway - In setting of seizures. HCAP H/O Tracheostomy March 2017 - S/P Decannulation.  P:   Mobilize pulm hygiene   CARDIOVASCULAR A:  Shock - Resolved. Possibly secondary to sepsis. Bradycardia > currently sinus in 40's H/O Cardiac Arrest w/ NSTEMI H/O Systolic CHF - EF 45 - 50% per echo from June 2017 H/O Hyperlipidemia  P:  Continue Telemetry monitoring  Vitals per Unit Protocol Continue outpatient ASA & atorvastatin.  RENAL A:   Hypokalemia - Replacing. Hyperchloremia - Mild. Pseudohypocalcemia - corrects to 9.0 7/14 Urinary retention - Foley replaced.  P:   Trending UOP with Foley Bethanechol started 7/18 KVO IVFs Monitoring electrolytes & renal function Replacing electrolytes as indicated  GASTROINTESTINAL A:   Dysphagia  P:   Start Dys I diet, whole pills in puree, nectar thick liquids  HEMATOLOGIC A:   Anemia - Chronic. No signs of active bleeding. Hgb stable. Leukocytosis - Resolved. Thrombocytopenia - Mild.   P:  Transfuse for Hgb < 7. SCD's  Trending cell counts w/ CBC  ENDOCRINE A:   Risk of Hyperglycemia - On stress steroids. Adrenal  Insufficiency - Cortisol 4.3.  P:   Taper steroids (decreased to 50mg  IV qd on 7/23) SSI per Standard Algorithm Accu-Checks q4hr  FAMILY  - Updates: none at bedside 7/20  To SDU soon > he is lethargic, periods of hypothermia, bradycardia   - Inter-disciplinary family meet or Palliative Care meeting due by:  06/23/16   Levy Pupa, MD, PhD 06/26/2016, 8:35 AM [facility] [phone] or if no answer [phone]

## 2016-06-27 DIAGNOSIS — I10 Essential (primary) hypertension: Secondary | ICD-10-CM

## 2016-06-27 LAB — GLUCOSE, CAPILLARY
GLUCOSE-CAPILLARY: 81 mg/dL (ref 65–99)
GLUCOSE-CAPILLARY: 84 mg/dL (ref 65–99)
GLUCOSE-CAPILLARY: 97 mg/dL (ref 65–99)
Glucose-Capillary: 105 mg/dL — ABNORMAL HIGH (ref 65–99)
Glucose-Capillary: 99 mg/dL (ref 65–99)

## 2016-06-27 LAB — CBC
HCT: 22.1 % — ABNORMAL LOW (ref 39.0–52.0)
Hemoglobin: 7.2 g/dL — ABNORMAL LOW (ref 13.0–17.0)
MCH: 28.7 pg (ref 26.0–34.0)
MCHC: 32.6 g/dL (ref 30.0–36.0)
MCV: 88 fL (ref 78.0–100.0)
PLATELETS: 222 10*3/uL (ref 150–400)
RBC: 2.51 MIL/uL — ABNORMAL LOW (ref 4.22–5.81)
RDW: 16.1 % — AB (ref 11.5–15.5)
WBC: 7.8 10*3/uL (ref 4.0–10.5)

## 2016-06-27 LAB — BASIC METABOLIC PANEL
Anion gap: 4 — ABNORMAL LOW (ref 5–15)
BUN: 17 mg/dL (ref 6–20)
CO2: 33 mmol/L — ABNORMAL HIGH (ref 22–32)
CREATININE: 0.96 mg/dL (ref 0.61–1.24)
Calcium: 8 mg/dL — ABNORMAL LOW (ref 8.9–10.3)
Chloride: 108 mmol/L (ref 101–111)
GFR calc Af Amer: >60 mL/min (ref >60)
Glucose, Bld: 82 mg/dL (ref 65–99)
POTASSIUM: 3.3 mmol/L — AB (ref 3.5–5.1)
SODIUM: 145 mmol/L (ref 135–145)

## 2016-06-27 LAB — VALPROIC ACID LEVEL: VALPROIC ACID LVL: 81 ug/mL (ref 50.0–100.0)

## 2016-06-27 MED ORDER — POTASSIUM CHLORIDE 20 MEQ/15ML (10%) PO SOLN
40.0000 meq | Freq: Three times a day (TID) | ORAL | Status: AC
  Administered 2016-06-27 (×2): 40 meq
  Filled 2016-06-27 (×2): qty 30

## 2016-06-27 MED ORDER — CHLORHEXIDINE GLUCONATE 0.12 % MT SOLN
OROMUCOSAL | Status: AC
  Filled 2016-06-27: qty 15

## 2016-06-27 NOTE — Progress Notes (Signed)
Nutrition Follow-up  DOCUMENTATION CODES:   Severe malnutrition in context of chronic illness  INTERVENTION:   Magic cup TID with meals, each supplement provides 290 kcal and 9 grams of protein   NUTRITION DIAGNOSIS:   Malnutrition (Severe) related to chronic illness (meningitis/dementia) as evidenced by severe depletion of muscle mass, severe depletion of body fat. Ongoing.   GOAL:   Patient will meet greater than or equal to 90% of their needs Progressing.   MONITOR:   Vent status, I & O's, Labs, Weight trends  ASSESSMENT:   Pt admitted from SNF pt with multiple admissions since March 2017 when he had strep pneumo meningitis, cardiac arrest prolonged intubation. Pt now readmitted with sepsis and seizure.   7/21 extubated 7/23 Dysphagia 1 with Nectar thick, meal completion 15-75% 7/24 diet downgraded to Dysphagia 1 with Honey  Pt is lethargic this am, did not answer any questions.  Medications reviewed and include: folvite, solu-cortef, KCl, thiamine Labs reviewed: K+ 3.3  Pt discussed during ICU rounds and with RN.    Diet Order:  DIET - DYS 1 Room service appropriate? Yes; Fluid consistency: Honey Thick  Skin:  Wound (see comment) (stage II sacrum, MASD sacrum)  Last BM:  7/23 300 ml via rectal tube  Height:   Ht Readings from Last 1 Encounters:  06/17/16 5\' 8"  (1.727 m)    Weight:   Wt Readings from Last 1 Encounters:  06/27/16 160 lb 7.9 oz (72.8 kg)    Ideal Body Weight:  70 kg  BMI:  Body mass index is 24.4 kg/m.  Estimated Nutritional Needs:   Kcal:  1900-2100  Protein:  95-115 grams  Fluid:  > 1.9 L/day  EDUCATION NEEDS:   No education needs identified at this time  Kendell Bane RD, LDN, CNSC [phone] Pager [phone] After Hours Pager

## 2016-06-27 NOTE — Progress Notes (Signed)
New Admission Note: transfer from 59M    Arrival Method: bed Mental Orientation: A/O Self  Telemetry:placed  Assessment: Completed Skin:clean dry intact IV:RFA LFA SL Pain:none Tubes:Rectal pouch Safety Measures: Safety Fall Prevention Plan has been given, discussed and signed Admission: Completed Unit Orientation: Patient has been orientated to the room, unit and staff.  Family:none present  Orders have been reviewed and implemented. Will continue to monitor the patient. Call light has been placed within reach and bed alarm has been activated.   Janeann Forehand BSN, RN

## 2016-06-27 NOTE — Progress Notes (Signed)
Speech Language Pathology Treatment: Dysphagia  Patient Details Name: Dale [name] MRN: [id number] DOB: 1966-06-21 Today's Date: 06/27/2016 Time: 1012-1028 SLP Time Calculation (min) (ACUTE ONLY): 16 min  Assessment / Plan / Recommendation Clinical Impression  Observation with nectar thick liquids revealed consistent delayed throat clearing and coughing. Honey thick liquids decreased s/s with one delayed throat clear; uncertain if esophageal component involved although no mention of esophageal during previous 3 MBS. Downgrade liquids to honey thick and continue Dys. Will determine if pt able to upgrade from puree in future session. Moderate verbal/tactile cues for small sips.    HPI HPI: 50 year old male with h/o strep pneumonia meningitis, dysphagia, dementia and hospital course complicated by cardiac arrest and cardiogenic shock in March 2016, ARF, shock liver, adrenal insufficiency presents with acute encephalopathy. SLP evaluations during previous admissions have recommended Dys 1 diet and nectar thick liquids with advancement to Dys 2 textures prior to discharge. Pt is pending possible PEG placement.      SLP Plan  Continue with current plan of care     Recommendations  Diet recommendations: Dysphagia 1 (puree);Honey-thick liquid Liquids provided via: Cup;No straw Medication Administration: Crushed with puree Supervision: Patient able to self feed;Full supervision/cueing for compensatory strategies;Staff to assist with self feeding Compensations: Slow rate;Small sips/bites;Minimize environmental distractions Postural Changes and/or Swallow Maneuvers: Seated upright 90 degrees             Oral Care Recommendations: Oral care BID Follow up Recommendations: Skilled Nursing facility Plan: Continue with current plan of care     GO                Royce Macadamia 06/27/2016, 10:35 AM  Breck Coons Lonell Face.Ed [employer] [phone]

## 2016-06-27 NOTE — Progress Notes (Signed)
PULMONARY / CRITICAL CARE MEDICINE   Name: Dale [name] MRN: [id number] DOB: 1965-12-21    ADMISSION DATE:  06/16/2016 CONSULTATION DATE:  06/16/2016  REFERRING MD:  EDP Dr Effie Shy  CHIEF COMPLAINT:  AMS  HISTORY OF PRESENT ILLNESS:   50 year old male with complex medical history including:  3/14-4/6: Admitted with strep pneumo meningitis, c/b cardiac arrest, and cardiogenic shock (EF 20%, recovered to 50%) briefly on amiodarone, ARDS requiring prolonged intubation and trach (now removed), ARF, shock liver, and adrenal insufficiency. He was discharged on 4/6 with weekly Pen G IM until 4/10. Has been re-admitted several times w/ recurrent infection and seen by ID.  Re-admitted 7/13 again presenting with AMS thought to be septic/PNA in origin but had witnessed seizure in ED. He was found to be hypotensive and hypothermic, however WBC and Lactic were wnl. CXR with LLL opacification He required CVL placement and vasopressors for shock despite aggressive IVF resuscitation. ICU admission for shock treatment and continuous EEG. He was intubated in the ED for airway protection and transferred to [facility]. Extubated 7/21  SUBJECTIVE:  Has been hypothermic, placed on active warming last night and given empiric abx.    VITAL SIGNS: BP 122/64   Pulse 63   Temp 99 F (37.2 C) (Oral)   Resp 16   Ht 5\' 8"  (1.727 m)   Wt 72.8 kg (160 lb 7.9 oz)   SpO2 99%   BMI 24.40 kg/m   HEMODYNAMICS:    VENTILATOR SETTINGS:    INTAKE / OUTPUT: I/O last 3 completed shifts: In: 3724.8 [P.O.:360; I.V.:2489.8; IV Piggyback:875] Out: 6553 [Urine:3285; Stool:300]  PHYSICAL EXAMINATION: General:  chronically ill looking male. Comfortable resp pattern Neuro:  RASS 0. Will interact slowly and speak single words. Followed commands, all Ext HEENT:  Intubated. OGT, PERL pinpoint Cardiovascular:  Normal heart sounds Lungs:  resps even non labored on vent, diffuse  Rhonchi, excellent F/Vt  Abdomen:   soft Musculoskeletal:  No cyanosis. No clubbing   LABS: PULMONARY No results for input(s): PHART, PCO2ART, PO2ART, HCO3, TCO2, O2SAT in the last 168 hours.  Invalid input(s): PCO2, PO2  CBC  Recent Labs Lab 06/24/16 0510 06/25/16 0525 06/27/16 0533  HGB 7.7* 7.3* 7.2*  HCT 23.3* 22.9* 22.1*  WBC 9.3 13.2* 7.8  PLT 117* 139* 222    COAGULATION No results for input(s): INR in the last 168 hours.  CARDIAC   No results for input(s): TROPONINI in the last 168 hours. No results for input(s): PROBNP in the last 168 hours.   CHEMISTRY  Recent Labs Lab 06/21/16 0530  06/23/16 0535 06/24/16 0510 06/25/16 0525 06/26/16 0537 06/27/16 0533  NA 146*  < > 144 146* 146* 146* 145  K 2.9*  < > 3.6 3.5 3.8 3.8 3.3*  CL 116*  < > 113* 111 111 110 108  CO2 24  < > 26 28 28  32 33*  GLUCOSE 107*  < > 102* 117* 84 97 82  BUN 13  < > 16 17 18 17 17   CREATININE 0.82  < > 0.84 0.84 1.12 0.99 0.96  CALCIUM 8.2*  < > 7.9* 8.0* 8.0* 8.3* 8.0*  MG 1.9  --  1.8 2.0 1.8  --   --   PHOS 1.9*  --  4.3 3.9  --   --   --   < > = values in this interval not displayed. Estimated Creatinine Clearance: 88.1 mL/min (by C-G formula based on SCr of 0.96 mg/dL).  LIVER  Recent Labs Lab 06/23/16 0535  ALBUMIN 1.8*     INFECTIOUS No results for input(s): LATICACIDVEN, PROCALCITON in the last 168 hours.   ENDOCRINE CBG (last 3)   Recent Labs  06/26/16 1924 06/26/16 2331 06/27/16 0759  GLUCAP 84 81 84     No results found. EVENTS: 7/13 - Admit w/ septic shock & seizure 7/21 ready for extubation  MICROBIOLOGY: Blood Ctx x2 7/13:  Negative  Urine Ctx 7/13:  Negative  MRSA PCR 7/14:  Negative  ANTIBIOTICS: Vancomycin 7/13 - 7/17; 7/22 >> 7/23 Zosyn 7/13 - 7/20; 7/22 >> 7/23  STUDIES:  CT head 7/14: New areas of subacute to chronic ischemia on the left. Progression in the degree of sinusitis as described. EEG 7/14: This was an abnormal EEG due to the presence of moderate  background slowing. This slowing is non-specific and is indicative of encephalopathy. Port CXR 7/16:  L IJ CVL with tip over SVC. Enteric tube below hemidiaphragm. ETT above carina.  MRI  7/18: 1. Normal expected interval evolution of previously identified infarct involving the left basal ganglia/corona radiata. Additionally, previously seen signal abnormalities within the bilateral basal ganglia have also largely resolved relative to previous MRI from 03/24/2016. Similarly, changes at the right parietal cortex have also evolved. No new areas of ischemia or injury identified.  2. Persistent layering debris/hemorrhage within the lateral ventricles, likely related to recent history of meningitis. No Hydrocephalus. Port CXR 7/19: Left basilar opacity unchanged. Endotracheal tube needs to be advanced 2 cm. Continuous EEG 7/20:  Mild diffuse encephalopathy. No electrographic seizures.  LINES/TUBES: OETT 7.5 7/13>> 7/21 L IJ CVL 07/13>> OGT 7/13>> 7/21 Foley 7/19>>  I reviewed CXR myself, no evidence of acute disease.  DISCUSSION: 50 y.o. M with complex PMH including recent admissions for strep pneumo meningitis c/b cardiac arrest and cardiogenic shock (March 2017).  Had trach at the time but has since been decannulated.  Now resides at [facility] and was admitted again 07/13 with septic shock shock due to PNA as well as seizures.  He required intubation in ED for airway protection. Extubated 7/21  ASSESSMENT / PLAN:  NEUROLOGIC A:   Acute Encephalopathy - ongoing.  Seems to be slowly improving.  Seizures. New Subacute Ischemia - Seen on CT scan. H/O strep pneumo meningitis, hydrocephalus, CVA.  P:   RASS goal: 0  AED per Neuro:  Keppra & Depacon Ativan IV prn seizure Neurology Following & appreciate recommendations Thiamine & FA VT daily  INFECTIOUS A:   Sepsis - Possible HCAP-->completed abx course  Possible HCAP H/O strep pneumo meningitis (march 2017).  P:   Received repeat doses  empiric vanco and zosyn 7/22 due to hypothermia, cx's performed D/Ced abx.  PULMONARY A: Acute Hypoxic Respiratory Failure Unable to Protect Airway - In setting of seizures. HCAP H/O Tracheostomy March 2017 - S/P Decannulation.  P:   Mobilize Pulm hygiene  IS if able to follow commands.  CARDIOVASCULAR A:  Shock - Resolved. Possibly secondary to sepsis. Bradycardia > currently sinus in 40's H/O Cardiac Arrest w/ NSTEMI H/O Systolic CHF - EF 45 - 50% per echo from June 2017 H/O Hyperlipidemia  P:  Continue Telemetry monitoring. Vitals per Unit Protocol. Tele monitoring. Continue outpatient ASA & atorvastatin.  RENAL A:   Hypokalemia - Replacing. Hyperchloremia - Mild. Pseudohypocalcemia - corrects to 9.0 7/14 Urinary retention - Foley replaced.  P:   Trending UOP with Foley. Bethanechol started 7/18. KVO IVFs. Monitoring electrolytes & renal function. Replacing electrolytes  as indicated.  GASTROINTESTINAL A:   Dysphagia  P:   Dys I diet, whole pills in puree, nectar thick liquids  HEMATOLOGIC A:   Anemia - Chronic. No signs of active bleeding. Hgb stable. Leukocytosis - Resolved. Thrombocytopenia - Mild.   P:  Transfuse for Hgb < 7. SCD's  Trending cell counts w/ CBC  ENDOCRINE A:   Risk of Hyperglycemia - On stress steroids. Adrenal Insufficiency - Cortisol 4.3.  P:   Hydrocortisone to 50 mg IV daily, taper slowly over the next few days. SSI per Standard Algorithm Accu-Checks q4hr  FAMILY  - Updates: Patient updated bedside.  Remains confused.  Discussed with [facility]-MD, transfer to med-surg and to [facility] service with PCCM off 7/25.  [name], M.D. [facility] Pulmonary/Critical Care Medicine. Pager: [phone]. After hours pager: [phone].

## 2016-06-27 NOTE — Progress Notes (Signed)
Pt vitals taken at 1616

## 2016-06-27 NOTE — Progress Notes (Signed)
Patient wife spoke with me and said she wanted her husband placed in either [place] or [employer]

## 2016-06-27 NOTE — Progress Notes (Signed)
Subjective:  Dale [name] appears significantly improved since last week. He has no evidence of any ongoing seizure activity. He continues on the Keppra at a dose of 1500 mg twice a day. He is also on the Depakote at 750 mg 3 times a day.  Today speech therapy is in the room with him assessing his function. They feel he may be able to tolerate nectar fluids. It appears the last Depakote level was checked about a week ago.  Objective: Current vital signs: BP 122/64   Pulse 63   Temp 99 F (37.2 C) (Oral)   Resp 16   Ht  (1.727 m)   Wt 72.8 kg (160 lb 7.9 oz)   SpO2 99%   BMI 24.40 kg/m  Vital signs in last 24 hours: Temp:  [95.8 F (35.4 C)-99 F (37.2 C)] 99 F (37.2 C) (07/24 0800) Pulse Rate:  [41-63] 63 (07/24 0800) Resp:  [0-18] 16 (07/24 0800) BP: (74-122)/(49-73) 122/64 (07/24 0800) SpO2:  [91 %-100 %] 99 % (07/24 0800) Weight:  [72.8 kg (160 lb 7.9 oz)] 72.8 kg (160 lb 7.9 oz) (07/24 0500)  Intake/Output from previous day: 07/23 0701 - 07/24 0700 In: 2709.8 [P.O.:360; I.V.:1889.8; IV Piggyback:460] Out: 2075 [Urine:2075] Intake/Output this shift: Total I/O In: 157.5 [I.V.:100; IV Piggyback:57.5] Out: -  Nutritional status: DIET - DYS 1 Room service appropriate? Yes with Assist; Fluid consistency: Nectar Thick   ROS History obtained from chart review  General ROS: negative for - chills, fatigue, fever, night sweats, weight gain or weight loss Psychological ROS: negative for - behavioral disorder, hallucinations, memory difficulties, mood swings or suicidal ideation Ophthalmic ROS: negative for - blurry vision, double vision, eye pain or loss of vision ENT ROS: negative for - epistaxis, nasal discharge, oral lesions, sore throat, tinnitus or vertigo Allergy and Immunology ROS: negative for - hives or itchy/watery eyes Hematological and Lymphatic ROS: negative for - bleeding problems, bruising or swollen lymph nodes Endocrine ROS: negative for - galactorrhea, hair  pattern changes, polydipsia/polyuria or temperature intolerance Respiratory ROS: negative for - cough, hemoptysis, shortness of breath or wheezing Cardiovascular ROS: negative for - chest pain, dyspnea on exertion, edema or irregular heartbeat Gastrointestinal ROS: negative for - abdominal pain, diarrhea, hematemesis, nausea/vomiting or stool incontinence Genito-Urinary ROS: negative for - dysuria, hematuria, incontinence or urinary frequency/urgency Musculoskeletal ROS: negative for - joint swelling or muscular weakness Neurological ROS: as noted in HPI Dermatological ROS: negative for rash and skin lesion changes    Neurologic Exam: Dale [name] is alert and oriented he is following commands, however he is a bit lethargic and slow to respond, he is moving all 4 extremities to request  Cranial nerves: Cranial nerves II through XII are intact there is no facial asymmetry hearing is intact  Motor: Dale [name] is noted to move all 4 extremities he has some difficulty lifting his right arm against gravity grip strength on the right is 5 out of 5 motor function appears normal in the lower extremities.  Sensation: Sensation is grossly intact throughout to non-noxious stimulation  Reflexes: Reflexes are 1+ to trace throughout  Cerebellar function: Cerebellar function could not be reliably assessed at this time     Lab Results: Basic Metabolic Panel:  Recent Labs Lab 06/21/16 0530  06/23/16 0535 06/24/16 0510 06/25/16 0525 06/26/16 0537 06/27/16 0533  NA 146*  < > 144 146* 146* 146* 145  K 2.9*  < > 3.6 3.5 3.8 3.8 3.3*  CL 116*  < > 113* 111 111  110 108  CO2 24  < > 26 28 28  32 33*  GLUCOSE 107*  < > 102* 117* 84 97 82  BUN 13  < > 16 17 18 17 17   CREATININE 0.82  < > 0.84 0.84 1.12 0.99 0.96  CALCIUM 8.2*  < > 7.9* 8.0* 8.0* 8.3* 8.0*  MG 1.9  --  1.8 2.0 1.8  --   --   PHOS 1.9*  --  4.3 3.9  --   --   --   < > = values in this interval not displayed.  Liver Function Tests:  Recent  Labs Lab 06/23/16 0535  ALBUMIN 1.8*   No results for input(s): LIPASE, AMYLASE in the last 168 hours. No results for input(s): AMMONIA in the last 168 hours.  CBC:  Recent Labs Lab 06/22/16 0520 06/23/16 0535 06/24/16 0510 06/25/16 0525 06/27/16 0533  WBC 8.3 7.9 9.3 13.2* 7.8  NEUTROABS 5.0 5.3  --   --   --   HGB 7.2* 7.5* 7.7* 7.3* 7.2*  HCT 21.5* 23.2* 23.3* 22.9* 22.1*  MCV 85.7 86.6 86.9 87.7 88.0  PLT 87* 95* 117* 139* 222    Cardiac Enzymes: No results for input(s): CKTOTAL, CKMB, CKMBINDEX, TROPONINI in the last 168 hours.  Lipid Panel: No results for input(s): CHOL, TRIG, HDL, CHOLHDL, VLDL, LDLCALC in the last 168 hours.  CBG:  Recent Labs Lab 06/26/16 1143 06/26/16 1625 06/26/16 1924 06/26/16 2331 06/27/16 0759  GLUCAP 123* 95 84 81 84    Microbiology: Results for orders placed or performed during the hospital encounter of 06/16/16  Culture, blood (Routine x 2)     Status: None   Collection Time: 06/16/16  5:40 PM  Result Value Ref Range Status   Specimen Description BLOOD RIGHT FOREARM  Final   Special Requests BOTTLES DRAWN AEROBIC AND ANAEROBIC 5CC EACH  Final   Culture   Final    NO GROWTH 5 DAYS Performed at [facility]    Report Status 06/21/2016 FINAL  Final  Culture, blood (Routine x 2)     Status: None   Collection Time: 06/16/16  5:48 PM  Result Value Ref Range Status   Specimen Description BLOOD LEFT ARM  Final   Special Requests BOTTLES DRAWN AEROBIC ONLY 5CC  Final   Culture   Final    NO GROWTH 5 DAYS Performed at [facility]    Report Status 06/21/2016 FINAL  Final  Urine culture     Status: None   Collection Time: 06/16/16  7:47 PM  Result Value Ref Range Status   Specimen Description URINE, CATHETERIZED  Final   Special Requests NONE  Final   Culture NO GROWTH Performed at [facility]   Final   Report Status 06/18/2016 FINAL  Final  MRSA PCR Screening     Status: None   Collection Time:  06/17/16  1:52 AM  Result Value Ref Range Status   MRSA by PCR NEGATIVE NEGATIVE Final    Comment:        The GeneXpert MRSA Assay (FDA approved for NASAL specimens only), is one component of a comprehensive MRSA colonization surveillance program. It is not intended to diagnose MRSA infection nor to guide or monitor treatment for MRSA infections.   Culture, blood (Routine X 2) w Reflex to ID Panel     Status: None (Preliminary result)   Collection Time: 06/25/16  7:20 PM  Result Value Ref Range Status   Specimen  Description LEFT ANTECUBITAL  Final   Special Requests IN PEDIATRIC BOTTLE 3CC  Final   Culture NO GROWTH < 24 HOURS  Final   Report Status PENDING  Incomplete  Culture, blood (Routine X 2) w Reflex to ID Panel     Status: None (Preliminary result)   Collection Time: 06/25/16  7:27 PM  Result Value Ref Range Status   Specimen Description BLOOD LEFT HAND  Final   Special Requests IN PEDIATRIC BOTTLE 2CC  Final   Culture NO GROWTH < 24 HOURS  Final   Report Status PENDING  Incomplete    Coagulation Studies: No results for input(s): LABPROT, INR in the last 72 hours.  Imaging: No results found.  Medications: I have reviewed the patient's current medications.  Assessment/Plan:  Dale [name] continues to make gradual progressive improvement. There is no evidence of any epileptic activity during today's evaluation. Dale [name] is awake alert and answering questions. He is following commands in all 4 extremities. He appears to have slight weakness in the right upper extremity.  Plan:  1. Continue present therapy with Keppra and Depakote.  2. Check Depakote level today.  3. We will follow his progress with you.   Ferdinand Cava M.D. Neurohospitalist [phone]

## 2016-06-28 DIAGNOSIS — Z452 Encounter for adjustment and management of vascular access device: Secondary | ICD-10-CM

## 2016-06-28 LAB — BASIC METABOLIC PANEL
Anion gap: 8 (ref 5–15)
BUN: 16 mg/dL (ref 6–20)
CHLORIDE: 107 mmol/L (ref 101–111)
CO2: 32 mmol/L (ref 22–32)
Calcium: 8.7 mg/dL — ABNORMAL LOW (ref 8.9–10.3)
Creatinine, Ser: 0.91 mg/dL (ref 0.61–1.24)
Glucose, Bld: 77 mg/dL (ref 65–99)
POTASSIUM: 3.5 mmol/L (ref 3.5–5.1)
SODIUM: 147 mmol/L — AB (ref 135–145)

## 2016-06-28 LAB — CBC
HEMATOCRIT: 27.7 % — AB (ref 39.0–52.0)
Hemoglobin: 8.6 g/dL — ABNORMAL LOW (ref 13.0–17.0)
MCH: 27.9 pg (ref 26.0–34.0)
MCHC: 31 g/dL (ref 30.0–36.0)
MCV: 89.9 fL (ref 78.0–100.0)
PLATELETS: 283 10*3/uL (ref 150–400)
RBC: 3.08 MIL/uL — AB (ref 4.22–5.81)
RDW: 16.4 % — AB (ref 11.5–15.5)
WBC: 9.8 10*3/uL (ref 4.0–10.5)

## 2016-06-28 LAB — GLUCOSE, CAPILLARY
GLUCOSE-CAPILLARY: 62 mg/dL — AB (ref 65–99)
GLUCOSE-CAPILLARY: 71 mg/dL (ref 65–99)
GLUCOSE-CAPILLARY: 56 mg/dL — AB (ref 65–99)
GLUCOSE-CAPILLARY: 75 mg/dL (ref 65–99)
Glucose-Capillary: 74 mg/dL (ref 65–99)
Glucose-Capillary: 103 mg/dL — ABNORMAL HIGH (ref 65–99)

## 2016-06-28 LAB — PHOSPHORUS: PHOSPHORUS: 4.3 mg/dL (ref 2.5–4.6)

## 2016-06-28 LAB — MAGNESIUM: Magnesium: 2.2 mg/dL (ref 1.7–2.4)

## 2016-06-28 MED ORDER — RESOURCE THICKENUP CLEAR PO POWD
ORAL | Status: DC | PRN
  Filled 2016-06-28: qty 125

## 2016-06-28 NOTE — Clinical Social Work Note (Signed)
CSW received call from Mrs. Dale [name] regarding her husband they are separated). She was very upset that when she called to get an update she was told that information could not be given to her. Mrs. Denno provided a code (CJ) for release of information, as she does not want other family members calling and accessing patient information without supplying the code. This information was shared with charge nurse and placed in Treatment Team Sticky Note box.  Genelle Bal, MSW, LCSW Licensed Clinical Social Worker Clinical Social Work Department [employer] [phone]

## 2016-06-28 NOTE — Progress Notes (Signed)
Heart rate equals 39 on Tele. Patient is sleeping but arouseable. MD notified, morning RN updated.

## 2016-06-28 NOTE — Progress Notes (Signed)
Physical Therapy Treatment Patient Details Name: Dale [name] MRN: [id number] DOB: Feb 17, 1966 Today's Date: 06/28/2016    History of Present Illness Patient is a 50 y/o male with hx of pneumonia meningitis, cardiac arrest, dementia, NSTEMI, seizures, CHF, HLD, and cerebral septic emboli with multiple recent admissions (one every month starting in March) and in and out of a SNF presents from [facility] with AMS. Found to be in septic shock due to HCAP and seizures. Intubated on arrival 7/13-7/21.    PT Comments    Patient progressing very slowly towards PT goals. Continues to demonstrate decreased initiation and decreased attention to task despite increased time and multimodal cues. Tolerated SPT to chair with Mod A of 2, RW management and max directional cues. Pt minimally verbal and lethargic this session. Continues to be appropriate for short term SNF. Recommend cognitive evaluation. Will follow.  Follow Up Recommendations  SNF     Equipment Recommendations  Other (comment) (defer to next venue)    Recommendations for Other Services       Precautions / Restrictions Precautions Precautions: Fall Restrictions Weight Bearing Restrictions: No    Mobility  Bed Mobility Overal bed mobility: Needs Assistance;+2 for physical assistance;+ 2 for safety/equipment Bed Mobility: Supine to Sit     Supine to sit: Mod assist;+2 for physical assistance;HOB elevated     General bed mobility comments: No initiation of movement despite increased time and cues. Once EOB, initiating moving bottom towards EOB.  Transfers Overall transfer level: Needs assistance Equipment used: Rolling walker (2 wheeled) Transfers: Sit to/from Stand Sit to Stand: Mod assist;+2 physical assistance Stand pivot transfers: Mod assist;+2 physical assistance       General transfer comment: Assist of 2 to boost to standing with manual assist to place BUEs on walker handles. Narrow BoS. Max directional cues to  perform SPT to chair with therapist manuevering RW and assisting with balance.   Ambulation/Gait                 Stairs            Wheelchair Mobility    Modified Rankin (Stroke Patients Only)       Balance Overall balance assessment: Needs assistance Sitting-balance support: Feet supported;No upper extremity supported Sitting balance-Leahy Scale: Poor Sitting balance - Comments: Variable assist needed sitting EOB ranging from min guard-Min A. Posterior lean. Postural control: Posterior lean Standing balance support: During functional activity Standing balance-Leahy Scale: Poor Standing balance comment: Reliant on BUEs for support on RW. Narrow BoS.                    Cognition Arousal/Alertness: Awake/alert Behavior During Therapy: Flat affect Overall Cognitive Status: No family/caregiver present to determine baseline cognitive functioning Area of Impairment: Orientation Orientation Level: Disoriented to;Time;Place;Situation Current Attention Level:  (focused progressing to sustained) Memory: Decreased short-term memory Following Commands: Follows one step commands inconsistently;Follows one step commands with increased time (multimodal cues) Safety/Judgement: Decreased awareness of deficits;Decreased awareness of safety Awareness:  (pre-intellectual) Problem Solving: Slow processing;Decreased initiation;Requires verbal cues;Requires tactile cues;Difficulty sequencing General Comments: Cues to stay alert to task    Exercises      General Comments        Pertinent Vitals/Pain Pain Assessment: Faces Faces Pain Scale: No hurt    Home Living                      Prior Function  PT Goals (current goals can now be found in the care plan section) Progress towards PT goals: Progressing toward goals (slowly)    Frequency  Min 2X/week    PT Plan Current plan remains appropriate    Co-evaluation             End of  Session Equipment Utilized During Treatment: Gait belt Activity Tolerance: Patient limited by fatigue;Patient limited by lethargy Patient left: in chair;with call bell/phone within reach;with chair alarm set     Time: 1400-1423 PT Time Calculation (min) (ACUTE ONLY): 23 min  Charges:  $Therapeutic Activity: 23-37 mins                    G Codes:      Floetta Brickey A Lashante Fryberger 06/28/2016, 2:49 PM ,Mylo Red, PT, DPT [phone]

## 2016-06-28 NOTE — Progress Notes (Signed)
Subjective:  Drug appears to be doing well this morning he has been transferred to 6  [address]. He is resting comfortably in bed. He is following commands but continues to have some baseline confusion. He has no evidence of any seizure activity. He continues on the Keppra and Depakote. His recent Depakote level was therapeutic at 81.  Exam: Vitals:   06/28/16 0500 06/28/16 0803  BP: 113/67 123/72  Pulse: (!) 46 (!) 42  Resp: 16 17  Temp: 98.2 F (36.8 C) 98 F (36.7 C)    HEENT-  Normocephalic, no lesions, without obvious abnormality.  Normal external eye and conjunctiva.  Normal TM's bilaterally.  Normal auditory canals and external ears. Normal external nose, mucus membranes and septum.  Normal pharynx. Cardiovascular- regular rate and rhythm, S1, S2 normal, no murmur, click, rub or gallop, pulses palpable throughout   Lungs- chest clear, no wheezing, rales, normal symmetric air entry, Heart exam - S1, S2 normal, no murmur, no gallop, rate regular Abdomen- soft, non-tender; bowel sounds normal; no masses,  no organomegaly Extremities- less then 2 second capillary refill Lymph-no adenopathy palpable Musculoskeletal-no joint tenderness, deformity or swelling Skin-warm and dry, no hyperpigmentation, vitiligo, or suspicious lesions    Gen: In bed, NAD MS: Cylar is awake and alert but continues to have baseline confusion he follows commands and moves all 4 extremities. MotCraig moves all 4 extremities he still has slight weakness of the right upper extremity. SenSensation appears grossly intact DTRReflexes are 1-2+ throughout.  Pertinent Labs reviewed  Impression:  Graesyn continues to show evidence of gradual improvement. 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 Riquelme has no evidence of any seizure activity. He continues on the Keppra and Depakote. His recent Depakote level was 81. He has now moved to 6  E.   Recommendations:  1) Continue with Keppra and Depakote.  2. We will follow his progress.   James A. Hilda Blades, M.D. Neurohospitalist Phone: [phone]   06/28/2016, 9:58 AM

## 2016-06-28 NOTE — Progress Notes (Signed)
Speech Language Pathology Treatment: Dysphagia  Patient Details Name: Dale [name] MRN: [id number] DOB: 1966/05/22 Today's Date: 06/28/2016 Time: 6644-0347 SLP Time Calculation (min) (ACUTE ONLY): 9 min  Assessment / Plan / Recommendation Clinical Impression  Pt continues to demonstrate persistent dysphagia with + s/s of aspiration with larger boluses of thin liquid trials.  Purees appear to be consumed without difficulty.  Pt's MS consistent with behavior during last admission - flat affect, indifference, limited spontaneity.  He has a hx of chronic dysphagia (with silent aspiration) that was likely exacerbated during this admission with eight-day intubation.  Given preponderance of risk factors and minimal improvement in symptoms since initial evaluation on the 21st, recommend proceeding with MBS next date to determine safest diet prior to D/C back to SNF.    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  Re-admitted 7/13 again presenting with AMS thought to be septic/PNA in origin but had witnessed seizure in ED.  SLP evaluations during previous admissions have recommended Dys 1 diet and nectar thick liquids with advancement to Dys 2 textures prior to discharge.  Intubated 7/13-21.       SLP Plan  Continue with current plan of care     Recommendations  Diet recommendations: Dysphagia 1 (puree);Honey-thick liquid Liquids provided via: Cup;No straw Medication Administration: Crushed with puree Supervision: Patient able to self feed;Full supervision/cueing for compensatory strategies;Staff to assist with self feeding Compensations: Slow rate;Small sips/bites;Minimize environmental distractions Postural Changes and/or Swallow Maneuvers: Seated upright 90 degrees              Oral Care Recommendations: Oral care BID Plan: Continue with current plan of care     GO               Dale [name] L. Dale [name], [place] CCC/SLP Pager [phone]  Dale [name] Dale [name] 06/28/2016, 2:51 PM

## 2016-06-28 NOTE — Progress Notes (Addendum)
[facility] PROGRESS NOTE  Dale [name] JME:[id number] DOB: 10-10-1966 DOA: 06/16/2016   PCP: No primary care provider on file.     Assessment/Plan: Active Problems:   Acute encephalopathy   Acute respiratory failure (HCC)   Acute respiratory failure with hypercapnia (HCC)   Encounter for central line placement   Pressure ulcer    50 year old male with complex medical history including:  3/14-4/6: Admitted with strep pneumo meningitis, c/b cardiac arrest, and cardiogenic shock (EF 20%, recovered to 50%) briefly on amiodarone, ARDS requiring prolonged intubation and trach (now removed), ARF, shock liver, and adrenal insufficiency. He was discharged on 4/6 with weekly Pen G IM until 4/10. Has been re-admitted several times w/ recurrent infection and seen by ID.  Re-admitted 7/13 again presenting with AMS thought to be septic/PNA in origin but had witnessed seizure in ED. He was found to be hypotensive and hypothermic, however WBC and Lactic were wnl. CXR with LLL opacification He required CVL placement and vasopressors for shock despite aggressive IVF resuscitation. ICU admission for shock treatment and continuous EEG. He was intubated in the ED for airway protection and transferred to [facility]. Extubated 7/21. Transferred to [facility] on 7/25  Assessment and plan  Acute Encephalopathy - ongoing.  Seems to be slowly improving.   Seizures. -strep pneumo meningitis earlier this year, Continue with Keppra and Depakote, continue prn Ativan for seizures,Dale [name] appears significantly improved since last week. He has no evidence of any ongoing seizure . Depakote level therapeutic  New Subacute Ischemia - Seen on CT scan. Seen by neurology, H/O strep pneumo meningitis, hydrocephalus, CVA.       Sepsis - Possible HCAP-->completed abx course  h/O strep pneumo meningitis (march 2017). Received repeat doses empiric vanco and zosyn 7/22 due to hypothermia, cx's performed D/Ced abx.     Acute Hypoxic Respiratory Failure Unable to Protect Airway - In setting of seizures. HCAP H/O Tracheostomy March 2017 - S/P Decannulation. Mobilize Pulm hygiene  IS if able to follow commands.    Shock - Resolved. Possibly secondary to sepsis. Bradycardia > currently sinus in 40's H/O Cardiac Arrest w/ NSTEMI H/O Systolic CHF - EF 45 - 22% per echo from June 2017 H/O Hyperlipidemia  Continue Telemetry monitoring. Continue aspirin and Lipitor      Hypokalemia - repleted, potassium 3.5 Hyperchloremia - Mild. Pseudohypocalcemia - corrects to 9.0 7/14 Urinary retention - Foley replaced.  Trending UOP with Foley. Bethanechol started 7/18. KVO IVFs. Monitoring electrolytes & renal function. Replacing electrolytes as indicated.    Dysphagia  Dysphagia 1 (puree);Honey-thick liquid    Anemia - Chronic. No signs of active bleeding. Hgb 8.6 Leukocytosis - Resolved. Thrombocytopenia - Mild.  Transfuse for Hgb < 7. SCD's  Trending cell counts w/ CBC    Risk of Hyperglycemia - On stress steroids. Adrenal Insufficiency - Cortisol 4.3. Hydrocortisone to 50 mg IV daily, taper slowly over the next few days. SSI per Standard Algorithm Accu-Checks q4hr    DVT prophylaxsis SCDs   Code Status:  Full code    Family Communication: Discussed in detail with the patient, all imaging results, lab results explained to the patient   Disposition Plan: Continue telemetry, not stable for discharge today      Consultants:  Critical care  EVENTS: 7/13 - Admit w/ septic shock & seizure 7/21 ready for extubation  MICROBIOLOGY: Blood Ctx x2 7/13:  Negative  Urine Ctx 7/13:  Negative  MRSA PCR 7/14:  Negative  ANTIBIOTICS: Vancomycin 7/13 - 7/17; 7/22 >> 7/23 Zosyn 7/13 - 7/20; 7/22 >> 7/23  STUDIES:  CT head 7/14: New areas of subacute to chronic ischemia on the left. Progression in the degree of sinusitis as described. EEG 7/14: This was an abnormal EEG due to the  presence of moderate background slowing. This slowing is non-specific and is indicative of encephalopathy. Port CXR 7/16:  L IJ CVL with tip over SVC. Enteric tube below hemidiaphragm. ETT above carina.  MRI  7/18: 1. Normal expected interval evolution of previously identified infarct involving the left basal ganglia/corona radiata. Additionally, previously seen signal abnormalities within the bilateral basal ganglia have also largely resolved relative to previous MRI from 03/24/2016. Similarly, changes at the right parietal cortex have also evolved. No new areas of ischemia or injury identified.  2. Persistent layering debris/hemorrhage within the lateral ventricles, likely related to recent history of meningitis. No Hydrocephalus. Port CXR 7/19: Left basilar opacity unchanged. Endotracheal tube needs to be advanced 2 cm. Continuous EEG 7/20:  Mild diffuse encephalopathy. No electrographic seizures.  LINES/TUBES: OETT 7.5 7/13>> 7/21 L IJ CVL 07/13>> OGT 7/13>> 7/21 Foley 7/19>>         HPI/Subjective: Bradycardic this morning heart rate in the 40s, patient also quite somnolent and minimally arousable  Objective: Vitals:   06/27/16 1500 06/27/16 2006 06/28/16 0500 06/28/16 0803  BP: (!) 90/55 (!) 95/59 113/67 123/72  Pulse: 61 (!) 59 (!) 46 (!) 42  Resp: _0 Temp: 97.5 F (36.4 C) 98.1 F (36.7 C) 98.2 F (36.8 C) 98 F (36.7 C)  TempSrc: Oral Oral Oral Oral  SpO2: 100% 91% 100% 100%  Weight:      Height:        Intake/Output Summary (Last 24 hours) at 06/28/16 0846 Last data filed at 06/28/16 9476  Gross per 24 hour  Intake            562.5 ml  Output             1075 ml  Net           -512.5 ml    Exam:  Examination:  General:  chronically ill looking male. Comfortable resp pattern Neuro:  RASS 0. Will interact slowly and speak single words. Followed commands, all Ext HEENT:  Intubated. OGT, PERL pinpoint Cardiovascular:  Normal heart  sounds Lungs:  resps even non labored on vent, diffuse  Rhonchi, excellent F/Vt  Abdomen:  soft Musculoskeletal:  No cyanosis. No clubbing    Data Reviewed: I have personally reviewed following labs and imaging studies  Micro Results Recent Results (from the past 240 hour(s))  Culture, blood (Routine X 2) w Reflex to ID Panel     Status: None (Preliminary result)   Collection Time: 06/25/16  7:20 PM  Result Value Ref Range Status   Specimen Description LEFT ANTECUBITAL  Final   Special Requests IN PEDIATRIC BOTTLE 3CC  Final   Culture NO GROWTH 2 DAYS  Final   Report Status PENDING  Incomplete  Culture, blood (Routine X 2) w Reflex to ID Panel     Status: None (Preliminary result)   Collection Time: 06/25/16  7:27 PM  Result Value Ref Range Status   Specimen Description BLOOD LEFT HAND  Final   Special Requests IN PEDIATRIC BOTTLE 2CC  Final   Culture NO GROWTH 2 DAYS  Final   Report Status PENDING  Incomplete    Radiology Reports Dg Abd 1 View  Result Date: 06/16/2016 CLINICAL DATA:  Orogastric tube placement. EXAM: ABDOMEN - 1 VIEW COMPARISON:  05/25/2016 FINDINGS: An orogastric tube is now seen with tip overlying the distal stomach in the expected region of the pylorus. A left femoral central venous catheter is seen in place as well as a temperature probe within the bladder. The bowel gas pattern is normal. IMPRESSION: Orogastric tube tip overlies the distal stomach, in expected region of pylorus. Electronically Signed   By: Earle Gell M.D.   On: 06/16/2016 20:50   Ct Head Wo Contrast  Result Date: 06/17/2016 CLINICAL DATA:  Unresponsive EXAM: CT HEAD WITHOUT CONTRAST TECHNIQUE: Contiguous axial images were obtained from the base of the skull through the vertex without intravenous contrast. COMPARISON:  05/19/2016 FINDINGS: The bony calvarium is intact. Increased mucosal thickening is noted within the ethmoid and sphenoid sinus. Air-fluid levels are noted within the maxillary  antra bilaterally consistent with a more acute degree of sinusitis. Lacunar infarct is noted within the left basal ganglia and left thalamus extending into the deep white matter new from the prior exam. No findings to suggest acute hemorrhage are noted. IMPRESSION: New areas of subacute to chronic ischemia on the left. Progression in the degree of sinusitis as described. Electronically Signed   By: Inez Catalina M.D.   On: 06/17/2016 13:06   Mr Jeri Cos WG Contrast  Result Date: 06/21/2016 CLINICAL DATA:  Initial evaluation for recurrent altered mental status, seizures, history of strep pneumo meningitis. Question new areas of and within the left basal ganglia. EXAM: MRI HEAD WITHOUT AND WITH CONTRAST TECHNIQUE: Multiplanar, multiecho pulse sequences of the brain and surrounding structures were obtained without and with intravenous contrast. CONTRAST:  76m MULTIHANCE GADOBENATE DIMEGLUMINE 529 MG/ML IV SOLN COMPARISON:  Comparison made with prior CT from 06/17/2016 as well as previous MRI from 03/24/2016. FINDINGS: Diffuse prominence of the CSF containing spaces compatible with generalized cerebral atrophy. There has been normal interval expected evolution of previously identified acute ischemic infarct involving the left corona radiata/basal ganglia, which now demonstrates progressive encephalomalacia without significant diffusion abnormality. Question minimal faint enhancement within this region extending towards the left cerebral peduncle, likely related to the late subacute ischemia (series 11, image 28). Additional remote infarctions involving the bilateral basal ganglia again seen. Chronic blood products again noted within the left external capsule. No new areas of restricted diffusion to suggest new ischemia or injury. Similar FLAIR signal abnormality at the right parietal cortex. Associated subtle laminar necrosis within this region (series 3, image 6). No significant enhancement now seen within this  region. Additional focus of cortical flares abnormality within the right frontal lobe slightly more prominent as compared to previous exam, although this appears to be chronic in nature. No appreciable enhancement within these areas. No other abnormal enhancement elsewhere within the brain. Major intracranial vascular flow voids are preserved. No mass lesion, mass effect, or midline shift. Layering susceptibility artifact within the stent was of both lateral ventricles, likely debris/chronic hemorrhage related to prior meningitis, similar to prior. No hydrocephalus. Craniocervical junction within normal limits. Visualized upper cervical spine unremarkable. Pituitary gland normal. No acute abnormality about the globes and orbits. Extensive opacity throughout the paranasal sinuses with fluid levels within the sphenoid and maxillary sinuses. Bilateral mastoid effusions. Fluid within the nasopharynx. Patient likely intubated. Bone marrow signal intensity within normal limits. No scalp soft tissue abnormality. IMPRESSION: 1. Normal expected interval evolution of previously identified infarct involving the left basal ganglia/corona radiata. Additionally, previously seen signal abnormalities  within the bilateral basal ganglia have also largely resolved relative to previous MRI from 03/24/2016. Similarly, changes at the right parietal cortex have also evolved. No new areas of ischemia or injury identified. 2. Persistent layering debris/hemorrhage within the lateral ventricles, likely related to recent history of meningitis. No hydrocephalus. Electronically Signed   By: Jeannine Boga M.D.   On: 06/21/2016 04:55   Dg Chest Port 1 View  Result Date: 06/25/2016 CLINICAL DATA:  Pneumonia. EXAM: PORTABLE CHEST 1 VIEW COMPARISON:  06/22/2016. FINDINGS: Interval extubation. Left IJ central line tip projects over the SVC. Heart size normal. Left lower lobe collapse/ consolidation. Hazy opacification over the lower  hemithoraces bilaterally, new on the right. IMPRESSION: 1. Left lower lobe collapse/consolidation. 2. Hazy opacification over both lower hemithoraces may be due to layering pleural fluid and/or true airspace opacification. Electronically Signed   By: Lorin Picket M.D.   On: 06/25/2016 10:13   Dg Chest Port 1 View  Result Date: 06/22/2016 CLINICAL DATA:  Respiratory failure, acute encephalopathy, cardiogenic shock, acute and chronic CHF, current smoker. EXAM: PORTABLE CHEST 1 VIEW COMPARISON:  Portable chest x-ray of June 19, 2016 FINDINGS: The right lung is adequately inflated and clear. On the left there is persistent basilar atelectasis or pneumonia with small pleural effusion. The heart is normal in size. The pulmonary vascularity is not engorged. The endotracheal tube tip lies 5.3 cm above the carina. The esophagogastric tube tip projects below the inferior margin of the image. The left internal jugular venous catheter tip projects over the proximal third of the SVC. IMPRESSION: Persistent left lower lobe atelectasis or pneumonia with trace pleural effusion. No CHF. The support tubes are in stable position. Electronically Signed   By: David  Martinique M.D.   On: 06/22/2016 07:20   Dg Chest Portable 1 View  Result Date: 06/19/2016 CLINICAL DATA:  Respiratory failure EXAM: PORTABLE CHEST 1 VIEW COMPARISON:  06/18/2016 FINDINGS: ET tube tip is above the carina. There is a left IJ catheter with tip in the projection of the SVC. Enteric tube tip is below the hemidiaphragm. Persistent opacity within the left lung base. Right lung is clear. IMPRESSION: 1. Stable position of the support apparatus. 2. No change in left base opacity. Electronically Signed   By: Kerby Moors M.D.   On: 06/19/2016 07:44   Dg Chest Port 1 View  Result Date: 06/18/2016 CLINICAL DATA:  50 year old male with a history of respiratory failure EXAM: PORTABLE CHEST 1 VIEW COMPARISON:  06/17/2016, 06/16/2016 FINDINGS: Cardiomediastinal  silhouette unchanged in size and contour. Endotracheal tube unchanged, terminating 3.8 cm above the carina. Unchanged left IJ approach central venous catheter, appearing to terminate superior vena cava. Gastric tube projects over the mediastinum, terminating out of the field of view. No visualized pneumothorax. Improved aeration in the left mid lung, with persistent opacity in the retrocardiac region. Blunting of left costophrenic angle. IMPRESSION: Improved aeration of the left lung, with persistent left basilar opacity, potentially consolidation, edema, and/ or atelectasis. Small left pleural effusion not excluded. Unchanged position of endotracheal tube, gastric tube, left IJ central venous catheter. Signed, Dulcy Fanny. Earleen Newport, DO [facility] Electronically Signed   By: Corrie Mckusick D.O.   On: 06/18/2016 10:15   Dg Chest Port 1 View  Result Date: 06/17/2016 CLINICAL DATA:  50 year old male with enteric tube placement. EXAM: PORTABLE CHEST 1 VIEW COMPARISON:  A abdominal radiograph dated 06/16/2016 FINDINGS: An endotracheal tube is noted with tip approximately 3 cm above  the carina. Single portable view of the chest demonstrates patchy area of hazy airspace opacity in the left mid to lower lung field. There is no pleural effusion or pneumothorax. The cardiac silhouette is within normal limits. Left IJ central line with tip over central SVC. An enteric tube extends into the epigastric area with tip superimposed over the right L3 pedicle likely in the distal stomach. There is no bowel dilatation or evidence of obstruction. No free air. No radiopaque calculi or foreign object. The soft tissues and osseous structures appear unremarkable. The previously seen left femoral central venous catheter has been removed. A temperature probe is partially visualized over the bladder. IMPRESSION: Endotracheal tube above the carina. Patchy area of airspace density in the  left mid to lower lung field. Follow-up recommended. Enteric tube in the distal stomach.  No bowel obstruction. Electronically Signed   By: Anner Crete M.D.   On: 06/17/2016 02:48   Dg Chest Port 1 View  Result Date: 06/16/2016 CLINICAL DATA:  Endotracheal tube placement EXAM: PORTABLE CHEST 1 VIEW COMPARISON:  06/16/2016 FINDINGS: There is an endotracheal tube with the tip 3.7 cm above the carina. Nasogastric tube coursing below the diaphragm. There is hazy left lower lobe airspace disease. There is no pleural effusion or pneumothorax. The heart and mediastinal contours are unremarkable. The osseous structures are unremarkable. IMPRESSION: 1. Hazy left lower lobe airspace disease concerning for pneumonia. 2. Endotracheal tube with the tip 3.7 cm above the carina. Electronically Signed   By: Kathreen Devoid   On: 06/16/2016 22:41   Dg Chest Port 1 View  Result Date: 06/16/2016 CLINICAL DATA:  Intubation.  Followup pneumonia. EXAM: PORTABLE CHEST 1 VIEW 8:24 p.m.: COMPARISON:  Portable chest x-ray earlier same date 5:49 p.m. and previously. FINDINGS: Endotracheal tube tip in satisfactory position projecting approximately 6 cm above the carina. Nasogastric tube courses below the diaphragm into the stomach though its tip is not included on the image. Consolidation in the left lower lobe as noted earlier. Developing patchy opacities at the right lung base. IMPRESSION: 1. Support apparatus satisfactory. Endotracheal tube tip approximately 6 cm above the carina. Nasogastric tube courses below the diaphragm into the stomach. 2. Left lower lobe pneumonia as noted earlier. Developing atelectasis and/or pneumonia involving the right lung base. Electronically Signed   By: Evangeline Dakin M.D.   On: 06/16/2016 20:49   Dg Chest Port 1 View  Result Date: 06/16/2016 CLINICAL DATA:  50 year old with possible aspiration and sepsis. EXAM: PORTABLE CHEST 1 VIEW COMPARISON:  05/29/2016, 05/27/2016 and earlier. FINDINGS:  Patient is rotated to the left. Cardiomediastinal silhouette unremarkable, unchanged. Airspace consolidation in the retrocardiac left lung base. Lungs otherwise clear. Pulmonary vascularity normal. No visible pleural effusions. IMPRESSION: Left lower lobe pneumonia. Electronically Signed   By: Evangeline Dakin M.D.   On: 06/16/2016 18:22   Dg Chest Port 1 View  Result Date: 05/29/2016 CLINICAL DATA:  Sepsis EXAM: PORTABLE CHEST 1 VIEW COMPARISON:  05/27/2016 chest radiograph. FINDINGS: Right PICC terminates in the middle third of the superior vena cava. Stable cardiomediastinal silhouette with normal heart size. No pneumothorax. No pleural effusion. Patchy left lower lobe consolidation is slightly increased. No pulmonary edema. IMPRESSION: Slightly increased patchy left lower lobe consolidation, suggesting pneumonia and/ or aspiration. Recommend follow-up chest imaging to resolution. Electronically Signed   By: Ilona Sorrel M.D.   On: 05/29/2016 12:30   Dg Abd Portable 1v  Result Date: 06/17/2016 CLINICAL DATA:  50 year old male with enteric tube placement. EXAM:  PORTABLE CHEST 1 VIEW COMPARISON:  A abdominal radiograph dated 06/16/2016 FINDINGS: An endotracheal tube is noted with tip approximately 3 cm above the carina. Single portable view of the chest demonstrates patchy area of hazy airspace opacity in the left mid to lower lung field. There is no pleural effusion or pneumothorax. The cardiac silhouette is within normal limits. Left IJ central line with tip over central SVC. An enteric tube extends into the epigastric area with tip superimposed over the right L3 pedicle likely in the distal stomach. There is no bowel dilatation or evidence of obstruction. No free air. No radiopaque calculi or foreign object. The soft tissues and osseous structures appear unremarkable. The previously seen left femoral central venous catheter has been removed. A temperature probe is partially visualized over the bladder.  IMPRESSION: Endotracheal tube above the carina. Patchy area of airspace density in the left mid to lower lung field. Follow-up recommended. Enteric tube in the distal stomach.  No bowel obstruction. Electronically Signed   By: Anner Crete M.D.   On: 06/17/2016 02:48   Dg Swallowing Func-speech Pathology  Result Date: 05/30/2016 Objective Swallowing Evaluation: Type of Study: MBS-Modified Barium Swallow Study Patient Details Name: JANATHAN BRIBIESCA MRN: [id number] Date of Birth: 07-23-1966 Today's Date: 05/30/2016 Time: SLP Start Time (ACUTE ONLY): 1225-SLP Stop Time (ACUTE ONLY): 1242 SLP Time Calculation (min) (ACUTE ONLY): 17 min Past Medical History: Past Medical History Diagnosis Date . Depression  . Cardiac arrest ([place])  . Cardiogenic shock ([place])  . Acute respiratory failure with hypoxia ([place])  . Meningitis, streptococcal 02/16/2016 . Trichomonal urethritis in male 02/16/2016 . Tobacco abuse 02/16/2016 . Substance abuse  . ETOH abuse  . Septic shock ([place])  . Urinary retention 05/04/2016   Urinary retention - Continue Flomax    . Systolic and diastolic CHF, chronic ([place])  . 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 Severe sepsis ([place])  . Respiratory failure ([place]) 03/03/2016 . Protein-calorie malnutrition, severe ([place])  . NSTEMI (non-ST elevated myocardial infarction) ([place]) 03/14/2016 . Meningoencephalitis  . Meningitis, pneumococcal, recurrent  . Low serum cortisol level (HCC) 05/04/2016   Low a.m. cortisol  - Was 1.2 checked on 04/03/2016  - This likely is reflective of cortisol suppression from steroid use - Tapered steroids off  . Hypothermia 03/20/2016 . Hypernatremia  . Hyperlipidemia 05/04/2016   Dyslipidemia - Continue statin.    Marland Kitchen Hydrocephalus  . Dysphagia 05/04/2016   Dysphagia - Dysphagia 1 diet per SLP    . Dementia 04/27/2016 . Convulsions/seizures ([place]) 05/04/2016   Seizure disorder - Continue Keppra, Depakote.    . CHF (congestive heart failure) ([place])  . Cerebral thrombosis with cerebral infarction 03/25/2016 .  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 Altered mental status  . AKI (acute kidney injury) ([place]) 02/16/2016 . Acute renal failure ([place])  . Acute ischemic stroke ([place])  . Acute encephalopathy  Past Surgical History: Past Surgical History Procedure Laterality Date . Ankle surgery   . Cardiac catheterization N/A 02/17/2016   Procedure: IABP Insertion;  Surgeon: Wellington Hampshire, MD;  Location: [place] [facility];  Service: Cardiovascular;  Laterality: N/A; . Tee without cardioversion N/A 03/29/2016   Procedure: TRANSESOPHAGEAL ECHOCARDIOGRAM (TEE);  Surgeon: Larey Dresser, MD;  Location[facility] ENDOSCOPY;  Service: Cardiovascular;  Laterality: N/A; HPI: 49 year old male with h/o strep pneumonia meningitis, dysphagia, dementia and hospital course complicated by cardiac arrest and cardiogenic shock in March 2016, ARF, shock liver, adrenal insufficiency presents with acute encephalopathy. SLP evaluations during previous admissions have  recommended Dys 1 diet and nectar thick liquids with advancement to Dys 2 textures prior to discharge. Pt is pending possible PEG placement. Subjective: pt alert, speaks when prompted to do so Assessment / Plan / Recommendation CHL IP CLINICAL IMPRESSIONS 05/30/2016 Therapy Diagnosis Mild pharyngeal phase dysphagia Clinical Impression Patient presents with a mild pharyngeal phase dysphagia, exacerbated by AMS with poor sustained attention/easily distracted. Patient with a delay in swallow initiation resulting in intermittent silent aspiration of thin liquids, prevented with nectar thick during today's study although unable to r/o episodic aspiration of thickened liquids as well. Oral phase functional with prolonged mastication of soft solids due to missing dentition. Pharyngeal strength WFL. Will continue nectar thick liquids and monitor at bedside for tolerance. Ability to advance liquid textures relies primarily on mentation.  Impact on safety and function Mild aspiration risk   CHL IP TREATMENT  RECOMMENDATION 05/30/2016 Treatment Recommendations Therapy as outlined in treatment plan below   Prognosis 05/30/2016 Prognosis for Safe Diet Advancement Fair Barriers to Reach Goals Cognitive deficits Barriers/Prognosis Comment -- CHL IP DIET RECOMMENDATION 05/30/2016 SLP Diet Recommendations Dysphagia 2 (Fine chop) solids;Nectar thick liquid Liquid Administration via Cup;No straw Medication Administration Whole meds with puree Compensations Slow rate;Small sips/bites;Minimize environmental distractions Postural Changes Seated upright at 90 degrees   CHL IP OTHER RECOMMENDATIONS 05/30/2016 Recommended Consults -- Oral Care Recommendations Oral care BID Other Recommendations Order thickener from pharmacy;Prohibited food (jello, ice cream, thin soups);Remove water pitcher   [facility] IP FOLLOW UP RECOMMENDATIONS 05/30/2016 Follow up Recommendations Skilled Nursing facility   [facility] IP FREQUENCY AND DURATION 05/30/2016 Speech Therapy Frequency (ACUTE ONLY) min 2x/week Treatment Duration 2 weeks      CHL IP ORAL PHASE 05/30/2016 Oral Phase WFL Oral - Pudding Teaspoon -- Oral - Pudding Cup -- Oral - Honey Teaspoon -- Oral - Honey Cup -- Oral - Nectar Teaspoon -- Oral - Nectar Cup -- Oral - Nectar Straw -- Oral - Thin Teaspoon -- Oral - Thin Cup -- Oral - Thin Straw -- Oral - Puree -- Oral - Mech Soft -- Oral - Regular -- Oral - Multi-Consistency -- Oral - Pill -- Oral Phase - Comment --  CHL IP PHARYNGEAL PHASE 05/30/2016 Pharyngeal Phase Impaired Pharyngeal- Pudding Teaspoon -- Pharyngeal -- Pharyngeal- Pudding Cup -- Pharyngeal -- Pharyngeal- Honey Teaspoon -- Pharyngeal -- Pharyngeal- Honey Cup -- Pharyngeal -- Pharyngeal- Nectar Teaspoon -- Pharyngeal -- Pharyngeal- Nectar Cup Delayed swallow initiation-pyriform sinuses;Delayed swallow initiation-vallecula Pharyngeal -- Pharyngeal- Nectar Straw -- Pharyngeal -- Pharyngeal- Thin Teaspoon Delayed swallow initiation-pyriform sinuses Pharyngeal -- Pharyngeal- Thin Cup Delayed swallow  initiation-pyriform sinuses;Penetration/Aspiration during swallow;Moderate aspiration Pharyngeal Material enters airway, passes BELOW cords without attempt by patient to eject out (silent aspiration) Pharyngeal- Thin Straw Delayed swallow initiation-pyriform sinuses;Penetration/Aspiration during swallow;Moderate aspiration Pharyngeal Material enters airway, passes BELOW cords without attempt by patient to eject out (silent aspiration) Pharyngeal- Puree Delayed swallow initiation-vallecula Pharyngeal -- Pharyngeal- Mechanical Soft Delayed swallow initiation-vallecula Pharyngeal -- Pharyngeal- Regular -- Pharyngeal -- Pharyngeal- Multi-consistency -- Pharyngeal -- Pharyngeal- Pill -- Pharyngeal -- Pharyngeal Comment --  CHL IP CERVICAL ESOPHAGEAL PHASE 05/30/2016 Cervical Esophageal Phase WFL Pudding Teaspoon -- Pudding Cup -- Honey Teaspoon -- Honey Cup -- Nectar Teaspoon -- Nectar Cup -- Nectar Straw -- Thin Teaspoon -- Thin Cup -- Thin Straw -- Puree -- Mechanical Soft -- Regular -- Multi-consistency -- Pill -- Cervical Esophageal Comment -- CHL IP GO 03/09/2016 Functional Assessment Tool Used skilled clinical judgement Functional Limitations Spoken language expressive Swallow Current Status (M1962) CJ Swallow  Goal Status [phone]) CI Swallow Discharge Status [phone]) (None) Motor Speech Current Status [phone]) (None) Motor Speech Goal Status [phone]) (None) Motor Speech Goal Status [phone]) (None) Spoken Language Comprehension Current Status [phone]) (None) Spoken Language Comprehension Goal Status [phone]) (None) Spoken Language Comprehension Discharge Status [phone]) (None) Spoken Language Expression Current Status [phone]) (None) Spoken Language Expression Goal Status [phone]) (None) Spoken Language Expression Discharge Status [phone]) (None) Attention Current Status (Y3016) (None) Attention Goal Status (W1093) (None) Attention Discharge Status [phone]) (None) Memory Current Status (D2202) (None) Memory Goal Status (R4270)  (None) Memory Discharge Status (W2376) (None) Voice Current Status [phone]) (None) Voice Goal Status (V7616) (None) Voice Discharge Status [phone]) (None) Other Speech-Language Pathology Functional Limitation [phone]) (None) Other Speech-Language Pathology Functional Limitation Goal Status [phone]) (None) Other Speech-Language Pathology Functional Limitation Discharge Status [phone]) (None) Gabriel Rainwater MA, CCC-SLP [phone] McCoy Leah Meryl 05/30/2016, 1:07 PM                CBC  Recent Labs Lab 06/22/16 0520 06/23/16 0535 06/24/16 0510 06/25/16 0525 06/27/16 0533 06/28/16 0548  WBC 8.3 7.9 9.3 13.2* 7.8 9.8  HGB 7.2* 7.5* 7.7* 7.3* 7.2* 8.6*  HCT 21.5* 23.2* 23.3* 22.9* 22.1* 27.7*  PLT 87* 95* 117* 139* 222 283  MCV 85.7 86.6 86.9 87.7 88.0 89.9  MCH 28.7 28.0 28.7 28.0 28.7 27.9  MCHC 33.5 32.3 33.0 31.9 32.6 31.0  RDW 16.8* 16.7* 16.5* 16.4* 16.1* 16.4*  LYMPHSABS 2.5 2.1  --   --   --   --   MONOABS 0.7 0.4  --   --   --   --   EOSABS 0.1 0.0  --   --   --   --   BASOSABS 0.0 0.0  --   --   --   --     Chemistries   Recent Labs Lab 06/23/16 0535 06/24/16 0510 06/25/16 0525 06/26/16 0537 06/27/16 0533 06/28/16 0548  NA 144 146* 146* 146* 145 147*  K 3.6 3.5 3.8 3.8 3.3* 3.5  CL 113* 111 111 110 108 107  CO2 _0 32 33* 32  GLUCOSE 102* 117* 84 97 82 77  BUN _1 CREATININE 0.84 0.84 1.12 0.99 0.96 0.91  CALCIUM 7.9* 8.0* 8.0* 8.3* 8.0* 8.7*  MG 1.8 2.0 1.8  --   --  2.2   ------------------------------------------------------------------------------------------------------------------ estimated creatinine clearance is 92.9 mL/min (by C-G formula based on SCr of 0.91 mg/dL). ------------------------------------------------------------------------------------------------------------------ No results for input(s): HGBA1C in the last 72  hours. ------------------------------------------------------------------------------------------------------------------ No results for input(s): CHOL, HDL, LDLCALC, TRIG, CHOLHDL, LDLDIRECT in the last 72 hours. ------------------------------------------------------------------------------------------------------------------ No results for input(s): TSH, T4TOTAL, T3FREE, THYROIDAB in the last 72 hours.  Invalid input(s): FREET3 ------------------------------------------------------------------------------------------------------------------ No results for input(s): VITAMINB12, FOLATE, FERRITIN, TIBC, IRON, RETICCTPCT in the last 72 hours.  Coagulation profile No results for input(s): INR, PROTIME in the last 168 hours.  No results for input(s): DDIMER in the last 72 hours.  Cardiac Enzymes No results for input(s): CKMB, TROPONINI, MYOGLOBIN in the last 168 hours.  Invalid input(s): CK ------------------------------------------------------------------------------------------------------------------ Invalid input(s): POCBNP   CBG:  Recent Labs Lab 06/27/16 0759 06/27/16 1207 06/27/16 1641 06/27/16 2004 06/28/16 0800  GLUCAP 84 105* 97 99 62*       Studies: No results found.    Lab Results  Component Value Date   HGBA1C 5.3 05/20/2016   HGBA1C 5.8 (H) 03/27/2016   Lab Results  Component Value Date  [place] 92 03/27/2016   CREATININE 0.91 06/28/2016       Scheduled Meds: . aspirin  325 mg Per Tube Daily  . atorvastatin  20 mg Oral q1800  . bethanechol  10 mg Oral Q8H  . chlorhexidine gluconate (SAGE KIT)  15 mL Mouth Rinse BID  . folic acid  1 mg Oral Daily  . hydrocortisone sod succinate (SOLU-CORTEF) inj  50 mg Intravenous Daily  . insulin aspart  0-15 Units Subcutaneous TID WC  . insulin aspart  0-5 Units Subcutaneous QHS  . levETIRAcetam  1,500 mg Intravenous Q12H  . thiamine  100 mg Oral Daily  . valproate sodium  750 mg Intravenous Q8H    Continuous Infusions:    LOS: 12 days    Time spent: >30 MINS    [facility]  [facility] Pager [phone]. If 7PM-7AM, please contact night-coverage at [email or web address], password [facility] 06/28/2016, 8:46 AM  LOS: 12 days

## 2016-06-29 ENCOUNTER — Inpatient Hospital Stay (HOSPITAL_COMMUNITY): Payer: Medicaid Other

## 2016-06-29 LAB — BLOOD GAS, ARTERIAL
ACID-BASE EXCESS: 10.3 mmol/L — AB (ref 0.0–2.0)
Bicarbonate: 34.6 mEq/L — ABNORMAL HIGH (ref 20.0–24.0)
Drawn by: 313061
FIO2: 0.21
O2 SAT: 94.4 %
PATIENT TEMPERATURE: 98.6
PCO2 ART: 48.8 mmHg — AB (ref 35.0–45.0)
PO2 ART: 74.6 mmHg — AB (ref 80.0–100.0)
TCO2: 36.1 mmol/L (ref 0–100)
pH, Arterial: 7.464 — ABNORMAL HIGH (ref 7.350–7.450)

## 2016-06-29 LAB — COMPREHENSIVE METABOLIC PANEL
ALK PHOS: 51 U/L (ref 38–126)
ALT: 19 U/L (ref 17–63)
ANION GAP: 8 (ref 5–15)
AST: 14 U/L — AB (ref 15–41)
Albumin: 2 g/dL — ABNORMAL LOW (ref 3.5–5.0)
BILIRUBIN TOTAL: 0.3 mg/dL (ref 0.3–1.2)
BUN: 13 mg/dL (ref 6–20)
CO2: 33 mmol/L — ABNORMAL HIGH (ref 22–32)
Calcium: 8.9 mg/dL (ref 8.9–10.3)
Chloride: 106 mmol/L (ref 101–111)
Creatinine, Ser: 0.81 mg/dL (ref 0.61–1.24)
GFR calc Af Amer: >60 mL/min (ref >60)
Glucose, Bld: 78 mg/dL (ref 65–99)
POTASSIUM: 3.9 mmol/L (ref 3.5–5.1)
Sodium: 147 mmol/L — ABNORMAL HIGH (ref 135–145)
TOTAL PROTEIN: 4.6 g/dL — AB (ref 6.5–8.1)

## 2016-06-29 LAB — MAGNESIUM: MAGNESIUM: 1.9 mg/dL (ref 1.7–2.4)

## 2016-06-29 LAB — CBC
HEMATOCRIT: 25.4 % — AB (ref 39.0–52.0)
HEMOGLOBIN: 8.1 g/dL — AB (ref 13.0–17.0)
MCH: 28.7 pg (ref 26.0–34.0)
MCHC: 31.9 g/dL (ref 30.0–36.0)
MCV: 90.1 fL (ref 78.0–100.0)
Platelets: 277 10*3/uL (ref 150–400)
RBC: 2.82 MIL/uL — ABNORMAL LOW (ref 4.22–5.81)
RDW: 16.7 % — AB (ref 11.5–15.5)
WBC: 11.8 10*3/uL — ABNORMAL HIGH (ref 4.0–10.5)

## 2016-06-29 LAB — GLUCOSE, CAPILLARY
GLUCOSE-CAPILLARY: 107 mg/dL — AB (ref 65–99)
Glucose-Capillary: 52 mg/dL — ABNORMAL LOW (ref 65–99)
Glucose-Capillary: 56 mg/dL — ABNORMAL LOW (ref 65–99)
Glucose-Capillary: 78 mg/dL (ref 65–99)
Glucose-Capillary: 83 mg/dL (ref 65–99)
Glucose-Capillary: 70 mg/dL (ref 65–99)
Glucose-Capillary: 61 mg/dL — ABNORMAL LOW (ref 65–99)
Glucose-Capillary: 189 mg/dL — ABNORMAL HIGH (ref 65–99)

## 2016-06-29 LAB — AMMONIA: Ammonia: 37 umol/L — ABNORMAL HIGH (ref 9–35)

## 2016-06-29 MED ORDER — BETHANECHOL CHLORIDE 10 MG PO TABS: 10.0000 mg | ORAL_TABLET | Freq: Three times a day (TID) | ORAL | 1 refills | Status: DC

## 2016-06-29 MED ORDER — DEXTROSE 50 % IV SOLN
INTRAVENOUS | Status: AC
  Administered 2016-06-29: 25 mL
  Filled 2016-06-29: qty 50

## 2016-06-29 MED ORDER — LEVETIRACETAM 100 MG/ML PO SOLN: 1500.0000 mg | Freq: Two times a day (BID) | ORAL | 12 refills | Status: DC

## 2016-06-29 MED ORDER — RESOURCE THICKENUP CLEAR PO POWD: 1.0000 | ORAL | 0 refills | Status: DC | PRN

## 2016-06-29 MED ORDER — VALPROATE SODIUM 250 MG/5ML PO SYRP: 750.0000 mg | ORAL_SOLUTION | Freq: Three times a day (TID) | ORAL | 0 refills | Status: DC

## 2016-06-29 MED ORDER — LORAZEPAM 0.5 MG PO TABS: 0.5000 mg | ORAL_TABLET | Freq: Three times a day (TID) | ORAL | 0 refills | Status: DC | PRN

## 2016-06-29 MED ORDER — DEXTROSE 50 % IV SOLN
INTRAVENOUS | Status: AC
  Administered 2016-06-29: 50 mL
  Filled 2016-06-29: qty 50

## 2016-06-29 MED ORDER — DEXTROSE-NACL 5-0.9 % IV SOLN
INTRAVENOUS | Status: DC
  Administered 2016-06-29: 15:00:00 via INTRAVENOUS

## 2016-06-29 MED ORDER — HYDROCORTISONE 20 MG PO TABS: 40.0000 mg | ORAL_TABLET | Freq: Two times a day (BID) | ORAL | 1 refills | Status: DC

## 2016-06-29 MED ORDER — HYDROCORTISONE 20 MG PO TABS
30.0000 mg | ORAL_TABLET | Freq: Two times a day (BID) | ORAL | Status: DC
  Administered 2016-06-29: 30 mg via ORAL
  Filled 2016-06-29 (×3): qty 1

## 2016-06-29 NOTE — Consult Note (Signed)
Speech Language Pathology Modified Barium Swallow:    Patient Details Name: Dale [name] MRN: [id number] DOB: 23-Jul-1966 Today's Date: 06/29/2016 Time: 2094-7096  MBS completed in radiology. Recommend Dys 1 diet with nectar thick liquids, crushed meds. Full report under Chart Review/Imaging.  Leigh Aurora 06/29/2016, 1:29 PM  Candis Kabel B. Katarzyna Wolven, MSP, CCC-SLP [phone]

## 2016-06-29 NOTE — Progress Notes (Signed)
Subjective:  Dale [name] appears lethargic during this morning's evaluation. On further discussion with the nursing staff he was quite agitated yesterday. He was actually moved the nursing station where he was in a chair behind the nursing staff. He has received some sedation this morning. He is moving all 4 extremities to command. But appears bit sleepy this morning. He continues on the Keppra and Depakote. There are no reports of seizure activity.  Exam: Vitals:   06/29/16 0604 06/29/16 0928  BP: (!) 141/72 118/63  Pulse: (!) 39 (!) 45  Resp: 18 17  Temp: 97.7 F (36.5 C) 97.6 F (36.4 C)    HEENT-  Normocephalic, no lesions, without obvious abnormality.  Normal external eye and conjunctiva.  Normal TM's bilaterally.  Normal auditory canals and external ears. Normal external nose, mucus membranes and septum.  Normal pharynx. Cardiovascular- regular rate and rhythm, S1, S2 normal, no murmur, click, rub or gallop, pulses palpable throughout   Lungs- chest clear, no wheezing, rales, normal symmetric air entry, Heart exam - S1, S2 normal, no murmur, no gallop, rate regular Abdomen- soft, non-tender; bowel sounds normal; no masses,  no organomegaly Extremities- less then 2 second capillary refill Lymph-no adenopathy palpable Musculoskeletal-no joint tenderness, deformity or swelling Skin-warm and dry, no hyperpigmentation, vitiligo, or suspicious lesions    Gen: In bed, NAD MS: Dale [name] appears bit lethargic this morning but he otherwise follows commands. He was quite agitated yesterday. He required sedation. CN: Cranial nerves II through XII are intact. Motor: Dale [name] is moving all 4 extremities against gravity and to command, mild left upper extremity paresis Sensory: Sensation is intact. DTR: Reflexes 1-2+.  Pertinent Labs/Diagnostics: Reviewed    Impression:   Dale [name] continues to show evidence of gradual. He is experiencing some agitation. He received some sedation this morning. He is  otherwise following commands and reveals no evidence of seizure activity. 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 He continues on the Keppra and Depakote.   Recommendations: 1) continue present therapy with Keppra and Depakote  2. We will follow his progress.   James A. Hilda Blades, M.D. Neurohospitalist Phone: [phone]   06/29/2016, 10:34 AM

## 2016-06-29 NOTE — Clinical Social Work Note (Signed)
CSW informed by patient's nurse that Mr. Lanman will not discharge back to facility today. [facility] admissions staff person contacted and advised. CSW will continue to follow and facilitate discharge back to facility when medically stable.  Genelle Bal, MSW, LCSW Licensed Clinical Social Worker Clinical Social Work Department [employer] [phone]

## 2016-06-29 NOTE — Discharge Summary (Addendum)
Physician Discharge Summary  Dale [name] MRN: [id number] DOB/AGE: 1966/05/02 50 y.o.  PCP: No primary care provider on file.   Admit date: 06/16/2016 Discharge date: 06/29/2016  Discharge Diagnoses:    Active Problems:   Acute encephalopathy   Acute respiratory failure (HCC)   Acute respiratory failure with hypercapnia (HCC)   Encounter for central line placement   Pressure ulcer   ADDENDUM DISCHARGE CANCELLED AS PATIENT STILL NOT AWAKE ENOUGH TO EAT Will repeat CT head ,ABG, ammonia level, hold discharge for now, palliative care consult for [place]    Follow-up recommendations Follow-up with PCP in 3-5 days , including all  additional recommended appointments as below Follow-up CBC, CMP in 3-5 days Continue seizure precautions, check Keppra, ammonia and Depakote levels in 3-5 days Continue Accu-Cheks every 6 hours  Diet recommendations: Dys 1 diet with nectar thick liquids Liquids provided via: Cup;No straw Medication Administration: Crushed with puree Supervision: Patient able to self feed;Full supervision/cueing for compensatory strategies;Staff to assist with self feeding Compensations: Slow rate;Small sips/bites;Minimize environmental distractions Postural Changes and/or Swallow Maneuvers: Seated upright 90 degrees     Current Discharge Medication List    START taking these medications   Details  bethanechol (URECHOLINE) 10 MG tablet Take 1 tablet (10 mg total) by mouth every 8 (eight) hours. Qty: 90 tablet, Refills: 1    hydrocortisone (CORTEF) 20 MG tablet Take 2 tablets (40 mg total) by mouth 2 (two) times daily. Qty: 120 tablet, Refills: 1    !! Maltodextrin-Xanthan Gum (RESOURCE THICKENUP CLEAR) POWD Take 120 g by mouth as needed. Qty: 1 Can, Refills: 0     !! - Potential duplicate medications found. Please discuss with provider.    CONTINUE these medications which have CHANGED   Details  levETIRAcetam (KEPPRA) 100 MG/ML solution Take 15 mLs (1,500 mg  total) by mouth 2 (two) times daily. Qty: 473 mL, Refills: 12    LORazepam (ATIVAN) 0.5 MG tablet Take 1 tablet (0.5 mg total) by mouth every 8 (eight) hours as needed for anxiety. Qty: 30 tablet, Refills: 0    valproic acid (DEPAKENE) 250 MG/5ML syrup Take 15 mLs (750 mg total) by mouth every 8 (eight) hours. Qty: 500 mL, Refills: 0      CONTINUE these medications which have NOT CHANGED   Details  albuterol (PROVENTIL) (2.5 MG/3ML) 0.083% nebulizer solution Take 3 mLs (2.5 mg total) by nebulization every 3 (three) hours as needed for wheezing. Qty: 75 mL, Refills: 12    aspirin 325 MG tablet Take 1 tablet (325 mg total) by mouth daily. Qty: 30 tablet, Refills: 0    atorvastatin (LIPITOR) 20 MG tablet Take 1 tablet (20 mg total) by mouth daily at 6 PM. Qty: 30 tablet, Refills: 0    famotidine (PEPCID) 20 MG tablet Take 1 tablet (20 mg total) by mouth daily.    feeding supplement, ENSURE ENLIVE, (ENSURE ENLIVE) LIQD Take 237 mLs by mouth every 6 (six) hours. Qty: 237 mL, Refills: 12    folic acid (FOLVITE) 1 MG tablet Take 1 tablet (1 mg total) by mouth daily. Qty: 30 tablet, Refills: 0    !! Maltodextrin-Xanthan Gum ([place]) POWD As needed.    tamsulosin (FLOMAX) 0.4 MG CAPS capsule Take 1 capsule (0.4 mg total) by mouth daily. Qty: 30 capsule, Refills: 0    thiamine 100 MG tablet Take 1 tablet (100 mg total) by mouth daily. Qty: 30 tablet, Refills: 0     !! - Potential duplicate medications  found. Please discuss with provider.    STOP taking these medications     CefTRIAXone Sodium (ROCEPHIN IV)      amoxicillin-clavulanate (AUGMENTIN) 875-125 MG tablet          Discharge Condition: Stable   Discharge Instructions Get Medicines reviewed and adjusted: Please take all your medications with you for your next visit with your Primary MD  Please request your Primary MD to go over all hospital tests and procedure/radiological results at the follow  up, please ask your Primary MD to get all Hospital records sent to his/her office.  If you experience worsening of your admission symptoms, develop shortness of breath, life threatening emergency, suicidal or homicidal thoughts you must seek medical attention immediately by calling 911 or calling your MD immediately if symptoms less severe.  You must read complete instructions/literature along with all the possible adverse reactions/side effects for all the Medicines you take and that have been prescribed to you. Take any new Medicines after you have completely understood and accpet all the possible adverse reactions/side effects.   Do not drive when taking Pain medications.   Do not take more than prescribed Pain, Sleep and Anxiety Medications  Special Instructions: If you have smoked or chewed Tobacco in the last 2 yrs please stop smoking, stop any regular Alcohol and or any Recreational drug use.  Wear Seat belts while driving.  Please note  You were cared for by a hospitalist during your hospital stay. Once you are discharged, your primary care physician will handle any further medical issues. Please note that NO REFILLS for any discharge medications will be authorized once you are discharged, as it is imperative that you return to your primary care physician (or establish a relationship with a primary care physician if you do not have one) for your aftercare needs so that they can reassess your need for medications and monitor your lab values.  Discharge Instructions    Diet - low sodium heart healthy    Complete by:  As directed   Increase activity slowly    Complete by:  As directed       No Known Allergies    Disposition: 03-Skilled Nursing Facility   Consults: *  Neurology Critical care     Significant Diagnostic Studies:  Dg Abd 1 View  Result Date: 06/16/2016 CLINICAL DATA:  Orogastric tube placement. EXAM: ABDOMEN - 1 VIEW COMPARISON:  05/25/2016 FINDINGS: An  orogastric tube is now seen with tip overlying the distal stomach in the expected region of the pylorus. A left femoral central venous catheter is seen in place as well as a temperature probe within the bladder. The bowel gas pattern is normal. IMPRESSION: Orogastric tube tip overlies the distal stomach, in expected region of pylorus. Electronically Signed   By: Myles Rosenthal M.D.   On: 06/16/2016 20:50   Ct Head Wo Contrast  Result Date: 06/17/2016 CLINICAL DATA:  Unresponsive EXAM: CT HEAD WITHOUT CONTRAST TECHNIQUE: Contiguous axial images were obtained from the base of the skull through the vertex without intravenous contrast. COMPARISON:  05/19/2016 FINDINGS: The bony calvarium is intact. Increased mucosal thickening is noted within the ethmoid and sphenoid sinus. Air-fluid levels are noted within the maxillary antra bilaterally consistent with a more acute degree of sinusitis. Lacunar infarct is noted within the left basal ganglia and left thalamus extending into the deep white matter new from the prior exam. No findings to suggest acute hemorrhage are noted. IMPRESSION: New areas of subacute to chronic  ischemia on the left. Progression in the degree of sinusitis as described. Electronically Signed   By: Inez Catalina M.D.   On: 06/17/2016 13:06   Mr Jeri Cos HC Contrast  Result Date: 06/21/2016 CLINICAL DATA:  Initial evaluation for recurrent altered mental status, seizures, history of strep pneumo meningitis. Question new areas of and within the left basal ganglia. EXAM: MRI HEAD WITHOUT AND WITH CONTRAST TECHNIQUE: Multiplanar, multiecho pulse sequences of the brain and surrounding structures were obtained without and with intravenous contrast. CONTRAST:  45m MULTIHANCE GADOBENATE DIMEGLUMINE 529 MG/ML IV SOLN COMPARISON:  Comparison made with prior CT from 06/17/2016 as well as previous MRI from 03/24/2016. FINDINGS: Diffuse prominence of the CSF containing spaces compatible with generalized cerebral  atrophy. There has been normal interval expected evolution of previously identified acute ischemic infarct involving the left corona radiata/basal ganglia, which now demonstrates progressive encephalomalacia without significant diffusion abnormality. Question minimal faint enhancement within this region extending towards the left cerebral peduncle, likely related to the late subacute ischemia (series 11, image 28). Additional remote infarctions involving the bilateral basal ganglia again seen. Chronic blood products again noted within the left external capsule. No new areas of restricted diffusion to suggest new ischemia or injury. Similar FLAIR signal abnormality at the right parietal cortex. Associated subtle laminar necrosis within this region (series 3, image 6). No significant enhancement now seen within this region. Additional focus of cortical flares abnormality within the right frontal lobe slightly more prominent as compared to previous exam, although this appears to be chronic in nature. No appreciable enhancement within these areas. No other abnormal enhancement elsewhere within the brain. Major intracranial vascular flow voids are preserved. No mass lesion, mass effect, or midline shift. Layering susceptibility artifact within the stent was of both lateral ventricles, likely debris/chronic hemorrhage related to prior meningitis, similar to prior. No hydrocephalus. Craniocervical junction within normal limits. Visualized upper cervical spine unremarkable. Pituitary gland normal. No acute abnormality about the globes and orbits. Extensive opacity throughout the paranasal sinuses with fluid levels within the sphenoid and maxillary sinuses. Bilateral mastoid effusions. Fluid within the nasopharynx. Patient likely intubated. Bone marrow signal intensity within normal limits. No scalp soft tissue abnormality. IMPRESSION: 1. Normal expected interval evolution of previously identified infarct involving the left  basal ganglia/corona radiata. Additionally, previously seen signal abnormalities within the bilateral basal ganglia have also largely resolved relative to previous MRI from 03/24/2016. Similarly, changes at the right parietal cortex have also evolved. No new areas of ischemia or injury identified. 2. Persistent layering debris/hemorrhage within the lateral ventricles, likely related to recent history of meningitis. No hydrocephalus. Electronically Signed   By: BJeannine BogaM.D.   On: 06/21/2016 04:55   Dg Chest Port 1 View  Result Date: 06/25/2016 CLINICAL DATA:  Pneumonia. EXAM: PORTABLE CHEST 1 VIEW COMPARISON:  06/22/2016. FINDINGS: Interval extubation. Left IJ central line tip projects over the SVC. Heart size normal. Left lower lobe collapse/ consolidation. Hazy opacification over the lower hemithoraces bilaterally, new on the right. IMPRESSION: 1. Left lower lobe collapse/consolidation. 2. Hazy opacification over both lower hemithoraces may be due to layering pleural fluid and/or true airspace opacification. Electronically Signed   By: MLorin PicketM.D.   On: 06/25/2016 10:13   Dg Chest Port 1 View  Result Date: 06/22/2016 CLINICAL DATA:  Respiratory failure, acute encephalopathy, cardiogenic shock, acute and chronic CHF, current smoker. EXAM: PORTABLE CHEST 1 VIEW COMPARISON:  Portable chest x-ray of June 19, 2016 FINDINGS: The right lung is  adequately inflated and clear. On the left there is persistent basilar atelectasis or pneumonia with small pleural effusion. The heart is normal in size. The pulmonary vascularity is not engorged. The endotracheal tube tip lies 5.3 cm above the carina. The esophagogastric tube tip projects below the inferior margin of the image. The left internal jugular venous catheter tip projects over the proximal third of the SVC. IMPRESSION: Persistent left lower lobe atelectasis or pneumonia with trace pleural effusion. No CHF. The support tubes are in stable  position. Electronically Signed   By: David  Martinique M.D.   On: 06/22/2016 07:20   Dg Chest Portable 1 View  Result Date: 06/19/2016 CLINICAL DATA:  Respiratory failure EXAM: PORTABLE CHEST 1 VIEW COMPARISON:  06/18/2016 FINDINGS: ET tube tip is above the carina. There is a left IJ catheter with tip in the projection of the SVC. Enteric tube tip is below the hemidiaphragm. Persistent opacity within the left lung base. Right lung is clear. IMPRESSION: 1. Stable position of the support apparatus. 2. No change in left base opacity. Electronically Signed   By: Kerby Moors M.D.   On: 06/19/2016 07:44   Dg Chest Port 1 View  Result Date: 06/18/2016 CLINICAL DATA:  50 year old male with a history of respiratory failure EXAM: PORTABLE CHEST 1 VIEW COMPARISON:  06/17/2016, 06/16/2016 FINDINGS: Cardiomediastinal silhouette unchanged in size and contour. Endotracheal tube unchanged, terminating 3.8 cm above the carina. Unchanged left IJ approach central venous catheter, appearing to terminate superior vena cava. Gastric tube projects over the mediastinum, terminating out of the field of view. No visualized pneumothorax. Improved aeration in the left mid lung, with persistent opacity in the retrocardiac region. Blunting of left costophrenic angle. IMPRESSION: Improved aeration of the left lung, with persistent left basilar opacity, potentially consolidation, edema, and/ or atelectasis. Small left pleural effusion not excluded. Unchanged position of endotracheal tube, gastric tube, left IJ central venous catheter. Signed, Dulcy Fanny. Earleen Newport, DO [facility] Electronically Signed   By: Corrie Mckusick D.O.   On: 06/18/2016 10:15   Dg Chest Port 1 View  Result Date: 06/17/2016 CLINICAL DATA:  50 year old male with enteric tube placement. EXAM: PORTABLE CHEST 1 VIEW COMPARISON:  A abdominal radiograph dated 06/16/2016 FINDINGS: An endotracheal tube is noted with tip  approximately 3 cm above the carina. Single portable view of the chest demonstrates patchy area of hazy airspace opacity in the left mid to lower lung field. There is no pleural effusion or pneumothorax. The cardiac silhouette is within normal limits. Left IJ central line with tip over central SVC. An enteric tube extends into the epigastric area with tip superimposed over the right L3 pedicle likely in the distal stomach. There is no bowel dilatation or evidence of obstruction. No free air. No radiopaque calculi or foreign object. The soft tissues and osseous structures appear unremarkable. The previously seen left femoral central venous catheter has been removed. A temperature probe is partially visualized over the bladder. IMPRESSION: Endotracheal tube above the carina. Patchy area of airspace density in the left mid to lower lung field. Follow-up recommended. Enteric tube in the distal stomach.  No bowel obstruction. Electronically Signed   By: Anner Crete M.D.   On: 06/17/2016 02:48   Dg Chest Port 1 View  Result Date: 06/16/2016 CLINICAL DATA:  Endotracheal tube placement EXAM: PORTABLE CHEST 1 VIEW COMPARISON:  06/16/2016 FINDINGS: There is an endotracheal tube with the tip 3.7 cm above the carina. Nasogastric tube coursing below  the diaphragm. There is hazy left lower lobe airspace disease. There is no pleural effusion or pneumothorax. The heart and mediastinal contours are unremarkable. The osseous structures are unremarkable. IMPRESSION: 1. Hazy left lower lobe airspace disease concerning for pneumonia. 2. Endotracheal tube with the tip 3.7 cm above the carina. Electronically Signed   By: Kathreen Devoid   On: 06/16/2016 22:41   Dg Chest Port 1 View  Result Date: 06/16/2016 CLINICAL DATA:  Intubation.  Followup pneumonia. EXAM: PORTABLE CHEST 1 VIEW 8:24 p.m.: COMPARISON:  Portable chest x-ray earlier same date 5:49 p.m. and previously. FINDINGS: Endotracheal tube tip in satisfactory position  projecting approximately 6 cm above the carina. Nasogastric tube courses below the diaphragm into the stomach though its tip is not included on the image. Consolidation in the left lower lobe as noted earlier. Developing patchy opacities at the right lung base. IMPRESSION: 1. Support apparatus satisfactory. Endotracheal tube tip approximately 6 cm above the carina. Nasogastric tube courses below the diaphragm into the stomach. 2. Left lower lobe pneumonia as noted earlier. Developing atelectasis and/or pneumonia involving the right lung base. Electronically Signed   By: Evangeline Dakin M.D.   On: 06/16/2016 20:49   Dg Chest Port 1 View  Result Date: 06/16/2016 CLINICAL DATA:  50 year old with possible aspiration and sepsis. EXAM: PORTABLE CHEST 1 VIEW COMPARISON:  05/29/2016, 05/27/2016 and earlier. FINDINGS: Patient is rotated to the left. Cardiomediastinal silhouette unremarkable, unchanged. Airspace consolidation in the retrocardiac left lung base. Lungs otherwise clear. Pulmonary vascularity normal. No visible pleural effusions. IMPRESSION: Left lower lobe pneumonia. Electronically Signed   By: Evangeline Dakin M.D.   On: 06/16/2016 18:22   Dg Abd Portable 1v  Result Date: 06/17/2016 CLINICAL DATA:  50 year old male with enteric tube placement. EXAM: PORTABLE CHEST 1 VIEW COMPARISON:  A abdominal radiograph dated 06/16/2016 FINDINGS: An endotracheal tube is noted with tip approximately 3 cm above the carina. Single portable view of the chest demonstrates patchy area of hazy airspace opacity in the left mid to lower lung field. There is no pleural effusion or pneumothorax. The cardiac silhouette is within normal limits. Left IJ central line with tip over central SVC. An enteric tube extends into the epigastric area with tip superimposed over the right L3 pedicle likely in the distal stomach. There is no bowel dilatation or evidence of obstruction. No free air. No radiopaque calculi or foreign object. The  soft tissues and osseous structures appear unremarkable. The previously seen left femoral central venous catheter has been removed. A temperature probe is partially visualized over the bladder. IMPRESSION: Endotracheal tube above the carina. Patchy area of airspace density in the left mid to lower lung field. Follow-up recommended. Enteric tube in the distal stomach.  No bowel obstruction. Electronically Signed   By: Anner Crete M.D.   On: 06/17/2016 02:48   Dg Swallowing Func-speech Pathology  Result Date: 05/30/2016 Objective Swallowing Evaluation: Type of Study: MBS-Modified Barium Swallow Study Patient Details Name: FLAVIO LINDROTH MRN: [id number] Date of Birth: [birth year] Today's Date: 05/30/2016 Time: SLP Start Time (ACUTE ONLY): 1225-SLP Stop Time (ACUTE ONLY): 1242 SLP Time Calculation (min) (ACUTE ONLY): 17 min Past Medical History: Past Medical History Diagnosis Date . Depression  . Cardiac arrest ([place])  . Cardiogenic shock ([place])  . Acute respiratory failure with hypoxia ([place])  . Meningitis, streptococcal 02/16/2016 . Trichomonal urethritis in male 02/16/2016 . Tobacco abuse 02/16/2016 . Substance abuse  . ETOH abuse  . Septic shock ([place])  . Urinary retention  05/04/2016   Urinary retention - Continue Flomax    . Systolic and diastolic CHF, chronic ([place])  . 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 Severe sepsis ([place])  . Respiratory failure ([place]) 03/03/2016 . Protein-calorie malnutrition, severe ([place])  . NSTEMI (non-ST elevated myocardial infarction) ([place]) 03/14/2016 . Meningoencephalitis  . Meningitis, pneumococcal, recurrent  . Low serum cortisol level (HCC) 05/04/2016   Low a.m. cortisol  - Was 1.2 checked on 04/03/2016  - This likely is reflective of cortisol suppression from steroid use - Tapered steroids off  . Hypothermia 03/20/2016 . Hypernatremia  . Hyperlipidemia 05/04/2016   Dyslipidemia - Continue statin.    Marland Kitchen Hydrocephalus  . Dysphagia 05/04/2016   Dysphagia - Dysphagia 1 diet per SLP    . Dementia  04/27/2016 . Convulsions/seizures ([place]) 05/04/2016   Seizure disorder - Continue Keppra, Depakote.    . CHF (congestive heart failure) ([place])  . Cerebral thrombosis with cerebral infarction 03/25/2016 . 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 Altered mental status  . AKI (acute kidney injury) ([place]) 02/16/2016 . Acute renal failure ([place])  . Acute ischemic stroke ([place])  . Acute encephalopathy  Past Surgical History: Past Surgical History Procedure Laterality Date . Ankle surgery   . Cardiac catheterization N/A 02/17/2016   Procedure: IABP Insertion;  Surgeon: Wellington Hampshire, MD;  Location: [facility];  Service: Cardiovascular;  Laterality: N/A; . Tee without cardioversion N/A 03/29/2016   Procedure: TRANSESOPHAGEAL ECHOCARDIOGRAM (TEE);  Surgeon: Larey Dresser, MD;  Location: [facility];  Service: Cardiovascular;  Laterality: N/A; HPI: 50 year old male with h/o strep pneumonia meningitis, dysphagia, dementia and hospital course complicated by cardiac arrest and cardiogenic shock in March 2016, ARF, shock liver, adrenal insufficiency presents with acute encephalopathy. SLP evaluations during previous admissions have recommended Dys 1 diet and nectar thick liquids with advancement to Dys 2 textures prior to discharge. Pt is pending possible PEG placement. Subjective: pt alert, speaks when prompted to do so Assessment / Plan / Recommendation CHL IP CLINICAL IMPRESSIONS 05/30/2016 Therapy Diagnosis Mild pharyngeal phase dysphagia Clinical Impression Patient presents with a mild pharyngeal phase dysphagia, exacerbated by AMS with poor sustained attention/easily distracted. Patient with a delay in swallow initiation resulting in intermittent silent aspiration of thin liquids, prevented with nectar thick during today's study although unable to r/o episodic aspiration of thickened liquids as well. Oral phase functional with prolonged mastication of soft solids due to missing dentition. Pharyngeal strength WFL. Will continue  nectar thick liquids and monitor at bedside for tolerance. Ability to advance liquid textures relies primarily on mentation.  Impact on safety and function Mild aspiration risk   CHL IP TREATMENT RECOMMENDATION 05/30/2016 Treatment Recommendations Therapy as outlined in treatment plan below   Prognosis 05/30/2016 Prognosis for Safe Diet Advancement Fair Barriers to Reach Goals Cognitive deficits Barriers/Prognosis Comment -- CHL IP DIET RECOMMENDATION 05/30/2016 SLP Diet Recommendations Dysphagia 2 (Fine chop) solids;Nectar thick liquid Liquid Administration via Cup;No straw Medication Administration Whole meds with puree Compensations Slow rate;Small sips/bites;Minimize environmental distractions Postural Changes Seated upright at 90 degrees   CHL IP OTHER RECOMMENDATIONS 05/30/2016 Recommended Consults -- Oral Care Recommendations Oral care BID Other Recommendations Order thickener from pharmacy;Prohibited food (jello, ice cream, thin soups);Remove water pitcher   CHL IP FOLLOW UP RECOMMENDATIONS 05/30/2016 Follow up Recommendations Skilled Nursing facility   [facility] IP FREQUENCY AND DURATION 05/30/2016 Speech Therapy Frequency (ACUTE ONLY) min 2x/week Treatment Duration 2 weeks      CHL IP ORAL PHASE 05/30/2016 Oral Phase WFL Oral -  Pudding Teaspoon -- Oral - Pudding Cup -- Oral - Honey Teaspoon -- Oral - Honey Cup -- Oral - Nectar Teaspoon -- Oral - Nectar Cup -- Oral - Nectar Straw -- Oral - Thin Teaspoon -- Oral - Thin Cup -- Oral - Thin Straw -- Oral - Puree -- Oral - Mech Soft -- Oral - Regular -- Oral - Multi-Consistency -- Oral - Pill -- Oral Phase - Comment --  CHL IP PHARYNGEAL PHASE 05/30/2016 Pharyngeal Phase Impaired Pharyngeal- Pudding Teaspoon -- Pharyngeal -- Pharyngeal- Pudding Cup -- Pharyngeal -- Pharyngeal- Honey Teaspoon -- Pharyngeal -- Pharyngeal- Honey Cup -- Pharyngeal -- Pharyngeal- Nectar Teaspoon -- Pharyngeal -- Pharyngeal- Nectar Cup Delayed swallow initiation-pyriform sinuses;Delayed swallow  initiation-vallecula Pharyngeal -- Pharyngeal- Nectar Straw -- Pharyngeal -- Pharyngeal- Thin Teaspoon Delayed swallow initiation-pyriform sinuses Pharyngeal -- Pharyngeal- Thin Cup Delayed swallow initiation-pyriform sinuses;Penetration/Aspiration during swallow;Moderate aspiration Pharyngeal Material enters airway, passes BELOW cords without attempt by patient to eject out (silent aspiration) Pharyngeal- Thin Straw Delayed swallow initiation-pyriform sinuses;Penetration/Aspiration during swallow;Moderate aspiration Pharyngeal Material enters airway, passes BELOW cords without attempt by patient to eject out (silent aspiration) Pharyngeal- Puree Delayed swallow initiation-vallecula Pharyngeal -- Pharyngeal- Mechanical Soft Delayed swallow initiation-vallecula Pharyngeal -- Pharyngeal- Regular -- Pharyngeal -- Pharyngeal- Multi-consistency -- Pharyngeal -- Pharyngeal- Pill -- Pharyngeal -- Pharyngeal Comment --  CHL IP CERVICAL ESOPHAGEAL PHASE 05/30/2016 Cervical Esophageal Phase WFL Pudding Teaspoon -- Pudding Cup -- Honey Teaspoon -- Honey Cup -- Nectar Teaspoon -- Nectar Cup -- Nectar Straw -- Thin Teaspoon -- Thin Cup -- Thin Straw -- Puree -- Mechanical Soft -- Regular -- Multi-consistency -- Pill -- Cervical Esophageal Comment -- CHL IP GO 03/09/2016 Functional Assessment Tool Used skilled clinical judgement Functional Limitations Spoken language expressive Swallow Current Status [phone]) CJ Swallow Goal Status (U8828) CI Swallow Discharge Status (M0349) (None) Motor Speech Current Status (Z7915) (None) Motor Speech Goal Status (A5697) (None) Motor Speech Goal Status (X4801) (None) Spoken Language Comprehension Current Status (K5537) (None) Spoken Language Comprehension Goal Status (S8270) (None) Spoken Language Comprehension Discharge Status [phone]) (None) Spoken Language Expression Current Status [phone]) (None) Spoken Language Expression Goal Status (F0071) (None) Spoken Language Expression Discharge Status  [phone]) (None) Attention Current Status (O8325) (None) Attention Goal Status (Q9826) (None) Attention Discharge Status (E1583) (None) Memory Current Status (E9407) (None) Memory Goal Status (W8088) (None) Memory Discharge Status (P1031) (None) Voice Current Status (R9458) (None) Voice Goal Status (P9292) (None) Voice Discharge Status (K4628) (None) Other Speech-Language Pathology Functional Limitation [phone]) (None) Other Speech-Language Pathology Functional Limitation Goal Status (R1165) (None) Other Speech-Language Pathology Functional Limitation Discharge Status [phone]) (None) Gabriel Rainwater MA, CCC-SLP [phone] McCoy Leah Meryl 05/30/2016, 1:07 PM                  Filed Weights   06/26/16 0500 06/27/16 0500 06/28/16 2004  Weight: 68.9 kg (151 lb 14.4 oz) 72.8 kg (160 lb 7.9 oz) 75.2 kg (165 lb 12.6 oz)     Microbiology: Recent Results (from the past 240 hour(s))  Culture, blood (Routine X 2) w Reflex to ID Panel     Status: None (Preliminary result)   Collection Time: 06/25/16  7:20 PM  Result Value Ref Range Status   Specimen Description LEFT ANTECUBITAL  Final   Special Requests IN PEDIATRIC BOTTLE 3CC  Final   Culture NO GROWTH 3 DAYS  Final   Report Status PENDING  Incomplete  Culture, blood (Routine X 2) w Reflex to ID Panel     Status: None (Preliminary result)  Collection Time: 06/25/16  7:27 PM  Result Value Ref Range Status   Specimen Description BLOOD LEFT HAND  Final   Special Requests IN PEDIATRIC BOTTLE 2CC  Final   Culture NO GROWTH 3 DAYS  Final   Report Status PENDING  Incomplete       Blood Culture    Component Value Date/Time   SDES BLOOD LEFT HAND 06/25/2016 1927   SPECREQUEST IN PEDIATRIC BOTTLE [facility] 06/25/2016 1927   CULT NO GROWTH 3 DAYS 06/25/2016 1927   REPTSTATUS PENDING 06/25/2016 1927      Labs: Results for orders placed or performed during the hospital encounter of 06/16/16 (from the past 48 hour(s))  Valproic acid level     Status: None    Collection Time: 06/27/16 11:50 AM  Result Value Ref Range   Valproic Acid Lvl 81 50.0 - 100.0 ug/mL  Glucose, capillary     Status: Abnormal   Collection Time: 06/27/16 12:07 PM  Result Value Ref Range   Glucose-Capillary 105 (H) 65 - 99 mg/dL   Comment 1 Notify RN    Comment 2 Document in Chart   Glucose, capillary     Status: None   Collection Time: 06/27/16  4:41 PM  Result Value Ref Range   Glucose-Capillary 97 65 - 99 mg/dL  Glucose, capillary     Status: None   Collection Time: 06/27/16  8:04 PM  Result Value Ref Range   Glucose-Capillary 99 65 - 99 mg/dL  Basic metabolic panel     Status: Abnormal   Collection Time: 06/28/16  5:48 AM  Result Value Ref Range   Sodium 147 (H) 135 - 145 mmol/L   Potassium 3.5 3.5 - 5.1 mmol/L   Chloride 107 101 - 111 mmol/L   CO2 32 22 - 32 mmol/L   Glucose, Bld 77 65 - 99 mg/dL   BUN 16 6 - 20 mg/dL   Creatinine, Ser 0.91 0.61 - 1.24 mg/dL   Calcium 8.7 (L) 8.9 - 10.3 mg/dL   GFR calc non Af Amer >60 >60 mL/min   GFR calc Af Amer >60 >60 mL/min    Comment: (NOTE) The eGFR has been calculated using the CKD EPI equation. This calculation has not been validated in all clinical situations. eGFR's persistently <60 mL/min signify possible Chronic Kidney Disease.    Anion gap 8 5 - 15  CBC     Status: Abnormal   Collection Time: 06/28/16  5:48 AM  Result Value Ref Range   WBC 9.8 4.0 - 10.5 K/uL   RBC 3.08 (L) 4.22 - 5.81 MIL/uL   Hemoglobin 8.6 (L) 13.0 - 17.0 g/dL   HCT 27.7 (L) 39.0 - 52.0 %   MCV 89.9 78.0 - 100.0 fL   MCH 27.9 26.0 - 34.0 pg   MCHC 31.0 30.0 - 36.0 g/dL   RDW 16.4 (H) 11.5 - 15.5 %   Platelets 283 150 - 400 K/uL  Magnesium     Status: None   Collection Time: 06/28/16  5:48 AM  Result Value Ref Range   Magnesium 2.2 1.7 - 2.4 mg/dL  Phosphorus     Status: None   Collection Time: 06/28/16  5:48 AM  Result Value Ref Range   Phosphorus 4.3 2.5 - 4.6 mg/dL  Glucose, capillary     Status: Abnormal   Collection  Time: 06/28/16  8:00 AM  Result Value Ref Range   Glucose-Capillary 62 (L) 65 - 99 mg/dL  Glucose, capillary  Status: None   Collection Time: 06/28/16  8:48 AM  Result Value Ref Range   Glucose-Capillary 71 65 - 99 mg/dL  Glucose, capillary     Status: None   Collection Time: 06/28/16 11:58 AM  Result Value Ref Range   Glucose-Capillary 74 65 - 99 mg/dL  Glucose, capillary     Status: Abnormal   Collection Time: 06/28/16  4:49 PM  Result Value Ref Range   Glucose-Capillary 56 (L) 65 - 99 mg/dL  Glucose, capillary     Status: None   Collection Time: 06/28/16  5:20 PM  Result Value Ref Range   Glucose-Capillary 75 65 - 99 mg/dL  Glucose, capillary     Status: Abnormal   Collection Time: 06/28/16  8:01 PM  Result Value Ref Range   Glucose-Capillary 103 (H) 65 - 99 mg/dL  CBC     Status: Abnormal   Collection Time: 06/29/16  4:18 AM  Result Value Ref Range   WBC 11.8 (H) 4.0 - 10.5 K/uL   RBC 2.82 (L) 4.22 - 5.81 MIL/uL   Hemoglobin 8.1 (L) 13.0 - 17.0 g/dL   HCT 25.4 (L) 39.0 - 52.0 %   MCV 90.1 78.0 - 100.0 fL   MCH 28.7 26.0 - 34.0 pg   MCHC 31.9 30.0 - 36.0 g/dL   RDW 16.7 (H) 11.5 - 15.5 %   Platelets 277 150 - 400 K/uL  Comprehensive metabolic panel     Status: Abnormal   Collection Time: 06/29/16  4:18 AM  Result Value Ref Range   Sodium 147 (H) 135 - 145 mmol/L   Potassium 3.9 3.5 - 5.1 mmol/L   Chloride 106 101 - 111 mmol/L   CO2 33 (H) 22 - 32 mmol/L   Glucose, Bld 78 65 - 99 mg/dL   BUN 13 6 - 20 mg/dL   Creatinine, Ser 0.81 0.61 - 1.24 mg/dL   Calcium 8.9 8.9 - 10.3 mg/dL   Total Protein 4.6 (L) 6.5 - 8.1 g/dL   Albumin 2.0 (L) 3.5 - 5.0 g/dL   AST 14 (L) 15 - 41 U/L   ALT 19 17 - 63 U/L   Alkaline Phosphatase 51 38 - 126 U/L   Total Bilirubin 0.3 0.3 - 1.2 mg/dL   GFR calc non Af Amer >60 >60 mL/min   GFR calc Af Amer >60 >60 mL/min    Comment: (NOTE) The eGFR has been calculated using the CKD EPI equation. This calculation has not been validated in  all clinical situations. eGFR's persistently <60 mL/min signify possible Chronic Kidney Disease.    Anion gap 8 5 - 15  Magnesium     Status: None   Collection Time: 06/29/16  4:18 AM  Result Value Ref Range   Magnesium 1.9 1.7 - 2.4 mg/dL  Glucose, capillary     Status: Abnormal   Collection Time: 06/29/16  7:35 AM  Result Value Ref Range   Glucose-Capillary 56 (L) 65 - 99 mg/dL  Glucose, capillary     Status: Abnormal   Collection Time: 06/29/16  7:53 AM  Result Value Ref Range   Glucose-Capillary 52 (L) 65 - 99 mg/dL  Glucose, capillary     Status: Abnormal   Collection Time: 06/29/16  8:22 AM  Result Value Ref Range   Glucose-Capillary 107 (H) 65 - 99 mg/dL     Lipid Panel     Component Value Date/Time   CHOL 161 03/27/2016 0534   TRIG 100 06/17/2016 0357   HDL  53 03/27/2016 0534   CHOLHDL 3.0 03/27/2016 0534   VLDL 16 03/27/2016 0534   LDLCALC 92 03/27/2016 0534     Lab Results  Component Value Date   HGBA1C 5.3 05/20/2016   HGBA1C 5.8 (H) 03/27/2016       50 year old male with complex medical history including:  3/14-4/6: Admitted with strep pneumo meningitis, c/b cardiac arrest, and cardiogenic shock (EF 20%, recovered to 50%) briefly on amiodarone, ARDS requiring prolonged intubation and trach (now removed), ARF, shock liver, and adrenal insufficiency. He was discharged on 4/6 with weekly Pen G IM until 4/10. Has been re-admitted several times w/ recurrent infection and seen by ID. Re-admitted 7/13 again presenting with AMS thought to be septic/PNA in origin but had witnessed seizure in ED. He was found to be hypotensive and hypothermic, however WBC and Lactic were wnl. CXR with LLL opacification He required CVL placement and vasopressors for shock despite aggressive IVF resuscitation. ICU admission for shock treatment and continuous EEG. He was intubated in the ED for airway protection and transferred to [facility]. Extubated 7/21. Transferred to [facility] on  7/25  Assessment and plan  Acute Encephalopathy - ongoing. Seems to be slowly improving. Occasionally lethargic. Following commands   Seizures. -strep pneumo meningitis earlier this year, Continue with Keppra and Depakote, dose adjusted during this hospitalization, continue prn Ativan for seizures,Dillon appears significantly improved since last week. He has no evidence of any ongoing seizure . Depakote level therapeutic Repeat Keppra and Depakote levels in the next 3-5 days  New Subacute Ischemia - extensive workup by neurology as follows H/O strep pneumo meningitis, hydrocephalus, CVA. Neurology suspects mild left upper extremity weakness related to the left basal ganglia infarction.  CT head 7/14:New areas of subacute to chronic ischemia on the left. Progression in the degree of sinusitis as described. EEG 7/14:This was an abnormal EEG due to the presence of moderate background slowing. This slowing is non-specific and is indicative of encephalopathy. MRI 7/18: 1. Normal expected interval evolution of previously identified infarct involving the left basal ganglia/corona radiata. Additionally, previously seen signal abnormalities within the bilateral basal ganglia have also largely resolved relative to previous MRI from 03/24/2016. Similarly, changes at the right parietal cortex have also evolved. No new areas of ischemia or injury identified. 2. Persistent layering debris/hemorrhage within the lateral ventricles, likely related to recent history of meningitis. No Hydrocephalus. Continuous EEG 7/20: Mild diffuse encephalopathy. No electrographic seizures.       Sepsis - Possible HCAP-->completed abx course  h/O strep pneumo meningitis (march 2017). Received repeat doses empiric vanco and zosyn 7/22 due to hypothermia, cx's performed D/Ced abx.    Acute Hypoxic Respiratory Failure Unable to Protect Airway - In setting of seizures. 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 Mobilize-will need rehabilitation Pulm hygiene      Shock - Resolved. Possibly secondary to sepsis.  Bradycardia > currently sinus in 40's H/O Cardiac Arrest w/ NSTEMI H/O Systolic CHF - EF 45 - 90% per echo from June 2017 H/O Hyperlipidemia Continue aspirin and Lipitor      Hypokalemia - repleted, potassium 3.5 Hyperchloremia - Mild. Urinary retention - Foley replaced.,   Trending UOP with Foley. Bethanechol started 7/18. Continue Flomax    Dysphagia  Dysphagia 1 (puree);Honey-thick liquid based on speech therapy evaluation    Anemia - Chronic. No signs of active bleeding. Hgb 8.1 Leukocytosis - improving Thrombocytopenia - resolved, platelets 277 on the day of discharge  Transfuse for Hgb < 7.      Adrenal insufficiency /Risk of Hypoglycemia -started on stress dose steroids for adrenal insufficiency, hypoglycemia Adrenal Insufficiency - Cortisol 4.3. Now on hydrocortisone 40 mg twice a day Continue Accu-Cheks every 6 hours  Discharge Exam:   Blood pressure 118/63, pulse (!) 45, temperature 97.6 F (36.4 C), temperature source Oral, resp. rate 17, height '5\' 8"'$  (1.727 m), weight 75.2 kg (165 lb 12.6 oz), SpO2 95 %.  General: chronically ill looking male. Comfortable resp pattern Neuro: RASS 0. Will interact slowly and speak single words. Followed commands, all Ext HEENT: Intubated. OGT, PERL pinpoint Cardiovascular: Normal heart sounds Lungs: resps even non labored on vent, diffuse Rhonchi, excellent F/Vt  Abdomen: soft Musculoskeletal: No cyanosis. No clubbing     Follow-up Information    pcp. Schedule an appointment as soon as possible for a visit in 2 day(s).   Why:  hospital follow up          Signed: Kriste Broman 06/29/2016, 11:08 AM        Time spent >45 mins

## 2016-06-30 ENCOUNTER — Inpatient Hospital Stay (HOSPITAL_COMMUNITY): Payer: Medicaid Other

## 2016-06-30 DIAGNOSIS — Z7189 Other specified counseling: Secondary | ICD-10-CM

## 2016-06-30 DIAGNOSIS — R400 Somnolence: Secondary | ICD-10-CM

## 2016-06-30 DIAGNOSIS — Z515 Encounter for palliative care: Secondary | ICD-10-CM

## 2016-06-30 LAB — GLUCOSE, CAPILLARY
GLUCOSE-CAPILLARY: 80 mg/dL (ref 65–99)
GLUCOSE-CAPILLARY: 80 mg/dL (ref 65–99)
GLUCOSE-CAPILLARY: 77 mg/dL (ref 65–99)
Glucose-Capillary: 92 mg/dL (ref 65–99)
Glucose-Capillary: 87 mg/dL (ref 65–99)

## 2016-06-30 LAB — CBC
HEMATOCRIT: 24.3 % — AB (ref 39.0–52.0)
Hemoglobin: 8 g/dL — ABNORMAL LOW (ref 13.0–17.0)
MCH: 28.8 pg (ref 26.0–34.0)
MCHC: 32.9 g/dL (ref 30.0–36.0)
MCV: 87.4 fL (ref 78.0–100.0)
PLATELETS: 277 10*3/uL (ref 150–400)
RBC: 2.78 MIL/uL — ABNORMAL LOW (ref 4.22–5.81)
RDW: 16.6 % — AB (ref 11.5–15.5)
WBC: 6.7 10*3/uL (ref 4.0–10.5)

## 2016-06-30 LAB — COMPREHENSIVE METABOLIC PANEL
ALT: 18 U/L (ref 17–63)
AST: 16 U/L (ref 15–41)
Albumin: 2 g/dL — ABNORMAL LOW (ref 3.5–5.0)
Alkaline Phosphatase: 46 U/L (ref 38–126)
Anion gap: 8 (ref 5–15)
BILIRUBIN TOTAL: 0.4 mg/dL (ref 0.3–1.2)
BUN: 9 mg/dL (ref 6–20)
CO2: 32 mmol/L (ref 22–32)
CREATININE: 0.82 mg/dL (ref 0.61–1.24)
Calcium: 8.3 mg/dL — ABNORMAL LOW (ref 8.9–10.3)
Chloride: 105 mmol/L (ref 101–111)
Glucose, Bld: 74 mg/dL (ref 65–99)
POTASSIUM: 3.3 mmol/L — AB (ref 3.5–5.1)
Sodium: 145 mmol/L (ref 135–145)
TOTAL PROTEIN: 4.5 g/dL — AB (ref 6.5–8.1)

## 2016-06-30 LAB — CULTURE, BLOOD (ROUTINE X 2)
CULTURE: NO GROWTH
Culture: NO GROWTH

## 2016-06-30 LAB — POCT I-STAT 3, ART BLOOD GAS (G3+)
ACID-BASE EXCESS: 10 mmol/L — AB (ref 0.0–2.0)
Bicarbonate: 34 mEq/L — ABNORMAL HIGH (ref 20.0–24.0)
O2 SAT: 89 %
PH ART: 7.525 — AB (ref 7.350–7.450)
TCO2: 35 mmol/L (ref 0–100)
pCO2 arterial: 41.2 mmHg (ref 35.0–45.0)
pO2, Arterial: 50 mmHg — ABNORMAL LOW (ref 80.0–100.0)

## 2016-06-30 MED ORDER — SODIUM CHLORIDE 0.9 % IV BOLUS (SEPSIS)
500.0000 mL | Freq: Four times a day (QID) | INTRAVENOUS | Status: DC | PRN
  Administered 2016-06-30 – 2016-07-02 (×2): 500 mL via INTRAVENOUS
  Filled 2016-06-30 (×2): qty 500

## 2016-06-30 MED ORDER — POTASSIUM CHLORIDE 10 MEQ/100ML IV SOLN
10.0000 meq | INTRAVENOUS | Status: AC
  Administered 2016-06-30 (×4): 10 meq via INTRAVENOUS
  Filled 2016-06-30 (×4): qty 100

## 2016-06-30 MED ORDER — SODIUM CHLORIDE 0.9 % IV BOLUS (SEPSIS)
1000.0000 mL | Freq: Once | INTRAVENOUS | Status: AC
  Administered 2016-06-30: 1000 mL via INTRAVENOUS

## 2016-06-30 MED ORDER — HYDROCORTISONE NA SUCCINATE PF 100 MG IJ SOLR
100.0000 mg | Freq: Three times a day (TID) | INTRAMUSCULAR | Status: DC
Start: 2016-06-30 — End: 2016-07-02
  Administered 2016-06-30 – 2016-07-02 (×7): 100 mg via INTRAVENOUS
  Filled 2016-06-30 (×7): qty 2

## 2016-06-30 MED ORDER — SODIUM CHLORIDE 0.9 % IV SOLN
INTRAVENOUS | Status: DC
  Administered 2016-06-30 – 2016-07-01 (×3): via INTRAVENOUS
  Filled 2016-06-30 (×6): qty 1000

## 2016-06-30 NOTE — Progress Notes (Signed)
Subjective:  Dale [name] is presently in the cardiac stepdown unit. Overnight he experienced difficulties with hypotension and hypothermia as well as being somewhat less responsive. These changes were felt to be related to adrenal insufficiency. This is presently being corrected.  This morning Dale [name] appears a bit lethargic but he opens his eyes follows commands and moves all 4 extremities. He continues to have some mild left upper extremity weakness.  Exam: Vitals:   06/30/16 0800 06/30/16 0822  BP: (!) 79/54   Pulse: (!) 53 (!) 55  Resp: 14 13  Temp:  98.2 F (36.8 C)    HEENT-  Normocephalic, no lesions, without obvious abnormality.  Normal external eye and conjunctiva.  Normal TM's bilaterally.  Normal auditory canals and external ears. Normal external nose, mucus membranes and septum.  Normal pharynx. Cardiovascular- regular rate and rhythm, S1, S2 normal, no murmur, click, rub or gallop, pulses palpable throughout   Lungs- chest clear, no wheezing, rales, normal symmetric air entry, Heart exam - S1, S2 normal, no murmur, no gallop, rate regular Abdomen- soft, non-tender; bowel sounds normal; no masses,  no organomegaly Extremities- less then 2 second capillary refill Lymph-no adenopathy palpable Musculoskeletal-no joint tenderness, deformity or swelling Skin-warm and dry, no hyperpigmentation, vitiligo, or suspicious lesions    Gen: In bed, NAD MS: Dale [name] is a bit more lethargic than yesterday he has received some sedation this morning. CN: Pupils are equal and reactive extraocular movements are intact there is no facial asymmetry. Motor: Mild left upper extremity paresis as noted all other extremity movements were normal. Sensory: Sensation is grossly intact. DTR: Reflexes are 1-2+.  Pertinent Labs/Diagnostics: Reviewed    Impression:   Dale [name] experienced difficulties overnight with hypotension and hypothermia. It is felt these changes are related to adrenal insufficiency.  Dale [name] is now receiving steroid replacement. Another consideration in a patient with a history of meningitis is the possibility of communicating hydrocephalus due to decreased CSF reabsorption. However, there is no clinical evidence of this and there is no evidence of papilledema on exam.   Recommendations:  1) Continue Keppra and Depakote.  2. We will continue to follow his progress.   James A. Hilda Blades, M.D. Neurohospitalist Phone: [phone]   06/30/2016, 9:40 AM

## 2016-06-30 NOTE — Progress Notes (Signed)
Speech Language Pathology Treatment: Dysphagia  Patient Details Name: Dale [name] MRN: [id number] DOB: 06-17-1966 Today's Date: 06/30/2016 Time: 5885-0277 SLP Time Calculation (min) (ACUTE ONLY): 12 min  Assessment / Plan / Recommendation Clinical Impression  Treatment session focused on safe consumption of nectar juice following recommendations from MBS yesterday. Pt awake, dazing off, exhibited focused attention to hearing his name. Required max repetition/cues to vocalize for assessment of vocal quality. Unable to achieve adequate phonation with true vocal folds and poor respiratory effort. Delayed cough x 2 of 6 cup sips nectar juice. Difficult to determine penetration of vestibule versus mucous. SLP will continue to treat for swallow. Continue Dys 1, nectar thick, crushpills, full supervision, eat/drink slowly, small bites/sips and no straws.      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  Re-admitted 7/13 again presenting with AMS thought to be septic/PNA in origin but had witnessed seizure in ED.  SLP evaluations during previous admissions have recommended Dys 1 diet and nectar thick liquids with advancement to Dys 2 textures prior to discharge.  Intubated 7/13-21.       SLP Plan  Continue with current plan of care     Recommendations  Diet recommendations: Dysphagia 1 (puree);Nectar-thick liquid Liquids provided via: Cup;No straw Medication Administration: Crushed with puree Supervision: Patient able to self feed;Full supervision/cueing for compensatory strategies;Staff to assist with self feeding Compensations: Slow rate;Small sips/bites;Minimize environmental distractions Postural Changes and/or Swallow Maneuvers: Seated upright 90 degrees           Oral Care Recommendations: Oral care BID Follow up Recommendations: Skilled Nursing facility Plan: Continue with current plan of care     GO                Royce Macadamia 06/30/2016, 10:24 AM   Breck Coons Lonell Face.[employer] [phone]

## 2016-06-30 NOTE — Progress Notes (Signed)
Rechecked rectal temp 93.3 after application of warm blankets.Patient remains lethargic,arousable. Kirby,NP text paged,order to transfer patient to stepdown. Rapid response RN notified. Will continue to monitor. Kimorah Ridolfi, Drinda Butts, Charity fundraiser

## 2016-06-30 NOTE — Progress Notes (Signed)
Patient's temp 93.7 rectally. Patient lethargic but arousable.Dale [name] notified via text page. Order received.Will continue to monitor. Dale [name], Drinda Butts, Charity fundraiser

## 2016-06-30 NOTE — Progress Notes (Signed)
Attempted to call patient's wife several times but nobody is picking up.Will attempt again later. Dale [name], Drinda Butts, Charity fundraiser

## 2016-06-30 NOTE — Progress Notes (Signed)
Patient's wife, Talbert Forest notified of patient's transfer to [phone]. Erhard Senske, Drinda Butts, Charity fundraiser

## 2016-06-30 NOTE — Consult Note (Addendum)
Consultation Note Date: 06/30/2016   Patient Name: Dale [name]  DOB: 02-Nov-1966  MRN: [id number]  Age / Sex: 50 y.o., male  PCP: No primary care provider on file. Referring Physician: Reyne Dumas, MD  Reason for Consultation: Establishing goals of care  HPI/Patient Profile: 50 y.o. male  admitted on 06/16/2016   Clinical Assessment and Goals of Care:  Dale [name] is a 50 year old Jomed known to the palliative service, seen in consultation and a previous hospitalization earlier this year. Patient has complicated medical history, patient was admitted with strep pneumonia meningitis, cardiac arrest in March-April this year. At that time he was diagnosed with cardiogenic shock ejection fraction 20% that subsequently recovered to 50%. At that time he also had ARDS requiring prolonged intubation and was trached which has since been removed. Patient at that time also had acute renal failure shock liver and adrenal insufficiency. He was discharged to nursing home to get weekly penicillin G injections. However, unfortunately, since then, he has been readmitted several times with recurrent infection.  The patient has been admitted to the hospital since 7-13 presented with altered mental status initially deemed to be sepsis with pneumonia as a source. However the patient had a witnessed seizure in the emergency department. Patient was given vasopressors and aggressive IV fluid resuscitation, initially placed in the ICU. Neurology service has been following throughout the course of this hospitalization. Initially he was intubated in this hospitalization but was extubated 7-21. He has been on both of care as well as hospitalist service thus far. He has been started on steroids, and maintained on antiepileptic medications, done with antibiotics course. Just prior to being discharged again this time, he underwent a rapid response  overnight on 7-26 for bradycardia hypothermia and low blood pressures. He was transferred to stepdown unit because he was minimally responsive. Palliative consultation requested for ongoing goals of care discussions.  Patient is a much older than stated age appearing gentleman resting in bed. He is awake and alert but does not communicate much. He does not appear to be in any acute distress. He does not verbalize much at all. Call placed and discussed with wife Dale [name] at [phone]. She states that they were separated for a while but never officially divorced. She states that she is his primary Media planner. She states that prior to his acute decompensation overnight and transferred to stepdown within the last 24 hours, he was eating fine. She endorses that she is praying for him. She states that he has come through so many times and would like to continue with aggressive, critical care and full code for now. However, after additional discussions, Ms. Dale [name] states that she is a Quarry manager and familiar that the patient is not making much of a recovery. She states that she wonders if she is prolonging his suffering. She is agreeable to consider DO NOT RESUSCITATE. She wishes to discuss this with the patient's adult children. She states that there are multiple complex psychosocial dynamics here. Patient is originally from [place]. Dale [name]  states that there is a lot of pressure on her to get the patient back to [place] where his family is. Offered active listening and supportive care to [place] over the phone. Advised her that we are happy to meet with her to set up a formal family meeting for additional discussions at any time. She states that she is raising 3 of her grandchildren and cannot come in today. She will however coordinate with the patient's children about her case considering DO NOT RESUSCITATE DO NOT INTUBATE at this time. Thank you for the consult. Please note additional recommendations  below.  NEXT OF KIN  Reported to be wife Dale [name] at [phone] or [phone]  SUMMARY OF RECOMMENDATIONS    1. Wife Dale [name] states that she recognizes that the patient is not making much of a recovery. She is considering DO NOT RESUSCITATE/DO NOT INTUBATE. However, she wishes to continue with full code for now until she gets a chance to discuss this with the patient's children later today. 2. Discussed with wife Dale [name] over the phone about the patient's multiple morbidities that place him at high risk of ongoing decompensation and eventually death. Advised and recommended DNR/DNI, comfort measures. Will follow-up with her again. Thank you for the consult. Code Status/Advance Care Planning:  Full code    Symptom Management:    Continue current mode of care  Palliative Prophylaxis:   Bowel Regimen  Additional Recommendations (Limitations, Scope, Preferences):  Full Scope Treatment  Psycho-social/Spiritual:   Desire for further Chaplaincy support:no  Additional Recommendations: Caregiving  Support/Resources  Prognosis:   < 6 months highly likely given ongoing decline  Discharge Planning: To Be Determined      Primary Diagnoses: Present on Admission: . Acute encephalopathy . Acute respiratory failure ([place])   I have reviewed the medical record, interviewed the patient and family, and examined the patient. The following aspects are pertinent.  Past Medical History:  Diagnosis Date  . Acute encephalopathy   . Acute ischemic stroke ([place])   . Acute renal failure ([place])   . Acute respiratory failure with hypoxia ([place])   . AKI (acute kidney injury) ([place]) 02/16/2016  . Altered mental status   . Cardiac arrest ([place])   . Cardiogenic shock ([place])   . Cerebral septic emboli ([place])   . Cerebral thrombosis with cerebral infarction 03/25/2016  . CHF (congestive heart failure) ([place])   . Convulsions/seizures ([place]) 05/04/2016   Seizure disorder - Continue Keppra,  Depakote.     . Dementia 04/27/2016  . Depression   . Dysphagia 05/04/2016   Dysphagia - Dysphagia 1 diet per SLP     . ETOH abuse   . Hydrocephalus   . Hyperlipidemia 05/04/2016   Dyslipidemia - Continue statin.     Marland Kitchen Hypernatremia   . Hypothermia 03/20/2016  . Low serum cortisol level ([facility]) 05/04/2016   Low a.m. cortisol  - Was 1.2 checked on 04/03/2016  - This likely is reflective of cortisol suppression from steroid use - Tapered steroids off   . Meningitis, pneumococcal, recurrent   . Meningitis, streptococcal 02/16/2016  . Meningoencephalitis   . NSTEMI (non-ST elevated myocardial infarction) ([place]) 03/14/2016  . Protein-calorie malnutrition, severe ([place])   . Respiratory failure ([place]) 03/03/2016  . Septic shock ([place])   . Severe sepsis ([place])   . 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 Substance abuse   . Systolic and diastolic CHF, chronic ([place])   . Tobacco abuse 02/16/2016  . 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  Urinary retention 05/04/2016   Urinary retention - Continue Flomax      Social History   Social History  . Marital status: Legally Separated    Spouse name: N/A  . Number of children: N/A  . Years of education: N/A   Social History Main Topics  . Smoking status: Current Every Day Smoker    Packs/day: 0.10    Types: Cigarettes  . Smokeless tobacco: None  . Alcohol use No  . Drug use:     Types: Marijuana  . Sexual activity: Not Asked   Other Topics Concern  . None   Social History Narrative   Admitted to [facility] 05/04/16   FULL CODE   History reviewed. No pertinent family history. Scheduled Meds: . aspirin  325 mg Per Tube Daily  . atorvastatin  20 mg Oral q1800  . bethanechol  10 mg Oral Q8H  . chlorhexidine gluconate (SAGE KIT)  15 mL Mouth Rinse BID  . folic acid  1 mg Oral Daily  . hydrocortisone sod succinate (SOLU-CORTEF) inj  100 mg Intravenous Q8H  . insulin aspart  0-15 Units Subcutaneous TID WC  . insulin aspart  0-5 Units  Subcutaneous QHS  . levETIRAcetam  1,500 mg Intravenous Q12H  . potassium chloride  10 mEq Intravenous Q1 Hr x 4  . thiamine  100 mg Oral Daily  . valproate sodium  750 mg Intravenous Q8H   Continuous Infusions: . 0.9 % sodium chloride with kcl 100 mL/hr at 06/30/16 1104   PRN Meds:.sodium chloride, RESOURCE THICKENUP CLEAR Medications Prior to Admission:  Prior to Admission medications   Medication Sig Start Date End Date Taking? Authorizing Provider  albuterol (PROVENTIL) (2.5 MG/3ML) 0.083% nebulizer solution Take 3 mLs (2.5 mg total) by nebulization every 3 (three) hours as needed for wheezing. Patient taking differently: Take 2.5 mg by nebulization every 3 (three) hours as needed for wheezing or shortness of breath.  03/10/16  Yes Reyne Dumas, MD  aspirin 325 MG tablet Take 1 tablet (325 mg total) by mouth daily. 06/01/16  Yes Janece Canterbury, MD  atorvastatin (LIPITOR) 20 MG tablet Take 1 tablet (20 mg total) by mouth daily at 6 PM. 04/04/16  Yes Robbie Lis, MD  CefTRIAXone Sodium (ROCEPHIN IV) Inject 1 g into the vein daily. For 7 days   Yes Historical Provider, MD  famotidine (PEPCID) 20 MG tablet Take 1 tablet (20 mg total) by mouth daily. 06/01/16  Yes Janece Canterbury, MD  feeding supplement, ENSURE ENLIVE, (ENSURE ENLIVE) LIQD Take 237 mLs by mouth every 6 (six) hours. 05/31/16  Yes Hosie Poisson, MD  folic acid (FOLVITE) 1 MG tablet Take 1 tablet (1 mg total) by mouth daily. 06/01/16  Yes Janece Canterbury, MD  Maltodextrin-Xanthan Gum ([place]) POWD As needed. 05/31/16  Yes Hosie Poisson, MD  tamsulosin (FLOMAX) 0.4 MG CAPS capsule Take 1 capsule (0.4 mg total) by mouth daily. 04/04/16  Yes Robbie Lis, MD  thiamine 100 MG tablet Take 1 tablet (100 mg total) by mouth daily. 06/01/16  Yes Janece Canterbury, MD  amoxicillin-clavulanate (AUGMENTIN) 875-125 MG tablet Take 1 tablet by mouth 2 (two) times daily. Patient not taking: Reported on 06/16/2016 05/31/16   Hosie Poisson, MD   bethanechol (URECHOLINE) 10 MG tablet Take 1 tablet (10 mg total) by mouth every 8 (eight) hours. 06/29/16   Reyne Dumas, MD  hydrocortisone (CORTEF) 20 MG tablet Take 2 tablets (40 mg total) by mouth 2 (two) times daily. 06/29/16  Reyne Dumas, MD  levETIRAcetam (KEPPRA) 100 MG/ML solution Take 15 mLs (1,500 mg total) by mouth 2 (two) times daily. 06/29/16   Reyne Dumas, MD  LORazepam (ATIVAN) 0.5 MG tablet Take 1 tablet (0.5 mg total) by mouth every 8 (eight) hours as needed for anxiety. 06/29/16   Reyne Dumas, MD  Maltodextrin-Xanthan Gum (RESOURCE THICKENUP CLEAR) POWD Take 120 g by mouth as needed. 06/29/16   Reyne Dumas, MD  valproic acid (DEPAKENE) 250 MG/5ML syrup Take 15 mLs (750 mg total) by mouth every 8 (eight) hours. 06/29/16   Reyne Dumas, MD   No Known Allergies Review of Systems Does not verbalize much meaningfully  Physical Exam  Weak, chronically ill-appearing gentleman Shallow respirations coarse rhonchorous breath sounds S1-S2 Abdomen soft mildly distended Edema Patient is awake opens eyes but does not verbalize much meaningfully   Vital Signs: BP (!) 79/54   Pulse 62   Temp 98.2 F (36.8 C) (Oral)   Resp 14   Ht '5\' 8"'$  (1.727 m)   Wt 61.2 kg (135 lb)   SpO2 99%   BMI 20.53 kg/m  Pain Assessment: No/denies pain   Pain Score: 0-No pain   SpO2: SpO2: 99 % O2 Device:SpO2: 99 % O2 Flow Rate: .O2 Flow Rate (L/min): 2 L/min  IO: Intake/output summary:  Intake/Output Summary (Last 24 hours) at 06/30/16 1124 Last data filed at 06/30/16 1000  Gross per 24 hour  Intake           950.83 ml  Output             1351 ml  Net          -400.17 ml    LBM: Last BM Date: 06/30/16 Baseline Weight: Weight: 55 kg (121 lb 4.1 oz) Most recent weight: Weight: 61.2 kg (135 lb)     Palliative Assessment/Data:   Flowsheet Rows   Flowsheet Row Most Recent Value  Intake Tab  Referral Department  Hospitalist  Unit at Time of Referral  ICU  Palliative Care Primary  Diagnosis  Neurology  Palliative Care Type  New Palliative care  Reason for referral  Clarify Goals of Care  Date first seen by Palliative Care  06/30/16  Clinical Assessment  Palliative Performance Scale Score  20%  Pain Max last 24 hours  5  Pain Min Last 24 hours  4  Dyspnea Max Last 24 Hours  4  Dyspnea Min Last 24 hours  3  Nausea Max Last 24 Hours  4  Nausea Min Last 24 Hours  3  Psychosocial & Spiritual Assessment  Palliative Care Outcomes  Patient/Family meeting held?  Yes  Who was at the meeting?  discussed with wife Dale [name] over the phone.   Palliative Care Outcomes  Clarified goals of care  Palliative Care follow-up planned  Yes, Facility      Time In:  10 Time Out:  11 Time Total:  60  Greater than 50%  of this time was spent counseling and coordinating care related to the above assessment and plan.  Signed by: Loistine Chance, MD  [phone]  Please contact Palliative Medicine Team phone at [phone] for questions and concerns.  For individual provider: See Shea Evans

## 2016-06-30 NOTE — Clinical Social Work Note (Signed)
Patient was set to discharge back to [facility] facility on 7/26 and discharge cancelled due to a change in patient's medical status. Mr. Pattison was transferred from 6E to 2H-28 early this morning. CSW will continue to monitor patient's progress and assist with discharge planning as appropriate.  Genelle Bal, MSW, LCSW Licensed Clinical Social Worker Clinical Social Work Department [employer] [phone]

## 2016-06-30 NOTE — Progress Notes (Signed)
Bedside RN called this RN at 0302 in regards to two low grade rectal temps of 93.7 at 0049 and 93.3 @ 0250. States she applied blankets after first low temp reading, noted a decline in second temp, paged triad and new orders to place pt on bear hugger and transfer for SDU. Patient placement and Crystal RR RN contacted by this RN for bed request and  assisted Charito RN with transfer of patient and belongings to [phone]. Assisted 2H with getting patient settled in and bear hugger applied. Rectal temp on arrival to 2H  93.2. Obtained by Sunny Schlein RN

## 2016-06-30 NOTE — Progress Notes (Signed)
Report given to [facility] RN. Patient will be transferred to 2H28. Jah Alarid, Drinda Butts, Charity fundraiser

## 2016-06-30 NOTE — Progress Notes (Signed)
Nutrition Follow-up  DOCUMENTATION CODES:   Severe malnutrition in context of chronic illness  INTERVENTION:   -Continue Magic Cup TID with meals  NUTRITION DIAGNOSIS:   Malnutrition (Severe) related to chronic illness (meningitis/dementia) as evidenced by severe depletion of muscle mass, severe depletion of body fat.  Ongoing  GOAL:   Patient will meet greater than or equal to 90% of their needs  Unmet  MONITOR:   Vent status, I & O's, Labs, Weight trends  REASON FOR ASSESSMENT:   Consult Assessment of nutrition requirement/status (TF recommendations)  ASSESSMENT:   Pt admitted from SNF pt with multiple admissions since March 2017 when he had strep pneumo meningitis, cardiac arrest prolonged intubation. Pt now readmitted with sepsis and seizure.   Pt underwent rapid response on 06/30/16 due to low grade rectal temperatures. Discharge back to [facility] was cancelled and pt was transferred to SDU for closer observation.   Pt remains with bair hugger. He would not respond to this RD for questions or commands.  Pt underwent MBSS on 06/29/16; noted SLP re-evaluated today and continued recommendations for dysphagia 1 diet with nectar thick liquids. Meal completion continues to be poor; PO: 0-5%.   Case discussed with RN. Pt is making minimal progress, reports status change likely related to adrenal insufficiency. Mental status is very inconsistent. Noted palliative care consult pending.   Labs reviewed.   Diet Order:  Diet - low sodium heart healthy Diet NPO time specified  Skin:  Wound (see comment) (stage II sacrum)  Last BM:  06/30/16  Height:   Ht Readings from Last 1 Encounters:  06/17/16 5\' 8"  (1.727 m)    Weight:   Wt Readings from Last 1 Encounters:  06/30/16 135 lb (61.2 kg)    Ideal Body Weight:  70 kg  BMI:  Body mass index is 20.53 kg/m.  Estimated Nutritional Needs:   Kcal:  1900-2100  Protein:  95-115 grams  Fluid:  > 1.9  L/day  EDUCATION NEEDS:   No education needs identified at this time  Torunn Chancellor A. Mayford Knife, RD, LDN, CDE Pager: [phone] After hours Pager: [phone]

## 2016-06-30 NOTE — Progress Notes (Signed)
No acute changes, resting quietly with no evidence of pain. Much more alert this evening and he is attempting to communicate. Very soft spoken and he tends to mumble rather than speaking clearly so he is very hard to understand. Body temp has returned to a more appropriate level, vital signs are stable. No PRN medication has been needed this shift. Call bell in reach, will continue to monitor.

## 2016-06-30 NOTE — Progress Notes (Signed)
[facility] PROGRESS NOTE  OM LIZOTTE TAV:[id number] DOB: [birth year] DOA: 06/16/2016   PCP: No primary care provider on file.     Assessment/Plan: Active Problems:   Acute encephalopathy   Acute respiratory failure (HCC)   Acute respiratory failure with hypercapnia (HCC)   Encounter for central line placement   Pressure ulcer    50 year old male with complex medical history including:  3/14-4/6: Admitted with strep pneumo meningitis, c/b cardiac arrest, and cardiogenic shock (EF 20%, recovered to 50%) briefly on amiodarone, ARDS requiring prolonged intubation and trach (now removed), ARF, shock liver, and adrenal insufficiency. He was discharged on 4/6 with weekly Pen G IM until 4/10. Has been re-admitted several times w/ recurrent infection and seen by ID. Re-admitted 7/13 again presenting with AMS thought to be septic/PNA in origin but had witnessed seizure in ED. He was found to be hypotensive and hypothermic, however WBC and Lactic were wnl. CXR with LLL opacification He required CVL placement and vasopressors for shock despite aggressive IVF resuscitation. ICU admission for shock treatment and continuous EEG. He was intubated in the ED for airway protection and transferred to [facility]. Extubated 7/21. Transferred to [facility] on 7/25. Patient develops signs of adrenal insufficiency again on 7/26 and had to be moved back to stepdown  Assessment and plan Acute Encephalopathy - ongoing. Seems to be slowly improving. Occasionally lethargic. Following commands. Waxing and waning mental status, repeat CT scan on 7/26 did not show any evidence of acute intracranial abnormality. Lacunar infarcts in the left basal ganglia  present on CT scan on 7/14 Neurology is following Patient transferred back to stepdown due to worsening encephalopathy  Adrenal insufficiency-manifesting as hypothermia hypoglycemia hypertension- I have restarted high-dose steroids, now on Solu-Cortef 100 mg  IV every 8 Cortisol level was 4.3 on admission Hypotensive today-will be treated with volume resuscitation  Seizures. -strep pneumo meningitis earlier this year, Continue with Keppra and Depakote, dose adjusted during this hospitalization, continue prn Ativan for seizures,Taijon appears to be waxing and waning. He has no evidence of any ongoing seizure . Depakote level therapeutic Repeat Keppra and Depakote levels in the next 3-5 days   New Subacute Ischemia - extensive workup by neurology as follows H/O strep pneumo meningitis, hydrocephalus, new CVA. Seizure disorder Neurology suspectsmild left upper extremity weakness related to the left basal ganglia infarction.  CT head 7/14:New areas of subacute to chronic ischemia on the left. Progression in the degree of sinusitis as described. EEG 7/14:This was an abnormal EEG due to the presence of moderate background slowing. This slowing is non-specific and is indicative of encephalopathy. MRI 7/18: 1. Normal expected interval evolution of previously identified infarct involving the left basal ganglia/corona radiata. Additionally, previously seen signal abnormalities within the bilateral basal ganglia have also largely resolved relative to previous MRI from 03/24/2016. Similarly, changes at the right parietal cortex have also evolved. No new areas of ischemia orinjury identified.   Persistent layering debris/hemorrhage within the lateral ventricles, likely related to recent history of meningitis. No Hydrocephalus. Continuous EEG 7/20: Mild diffuse encephalopathy. No electrographic seizures. I don't feel that the patient has had any significant improvement in the last 3 days that I have taken care of the patient Remains at high risk for aspiration    Sepsis - Possible HCAP-->completed abx course  h/O strep pneumo meningitis (march 2017). Received repeat doses empiric vanco and zosyn 7/22 due to hypothermia, cx's performed D/Ced  abx.   Acute Hypoxic  Respiratory Failure Unable to Protect Airway - In setting of seizures. Intubated from 7/13-7/21 HCAP , chest x-ray on 7/22  showed left lower lobe collapse H/O Tracheostomy March 2017 - S/P Decannulation. Mobilize-will need rehabilitation Pulm hygiene     Shock - Resolved. Possibly secondary to sepsis.  Bradycardia >currently sinus in 40's H/O Cardiac Arrest w/ NSTEMI H/O Systolic CHF - EF 45 - 08% per echo from June 2017 H/O Hyperlipidemia Continue aspirin and Lipitor    Hypokalemia - repleted, potassium 3.5 Hyperchloremia - Mild. Urinary retention - Foley replaced.,  Trending UOP with Foley. Bethanechol started 7/18. Continue Flomax   Dysphagia  Dysphagia 1 (puree);Honey-thick liquid based on speech therapy evaluation   Anemia - Chronic. No signs of active bleeding. Hgb 8.1 Leukocytosis - improving Thrombocytopenia - resolved, platelets 277 on the day of discharge  Transfuse for Hgb < 7.         DVT prophylaxsis SCDs   Code Status:  Full code    Family Communication:  No family by the bedside  Disposition Plan: Continue stepdown, palliative care consulted due to no improvement in the patient's mental status     Consultants:  Critical care  Palliative care  EVENTS: 7/13 - Admit w/ septic shock & seizure 7/21 ready for extubation  MICROBIOLOGY: Blood Ctx x2 7/13:  Negative  Urine Ctx 7/13:  Negative  MRSA PCR 7/14:  Negative  ANTIBIOTICS: Vancomycin 7/13 - 7/17; 7/22 >> 7/23 Zosyn 7/13 - 7/20; 7/22 >> 7/23  STUDIES:  CT head 7/14: New areas of subacute to chronic ischemia on the left. Progression in the degree of sinusitis as described. EEG 7/14: This was an abnormal EEG due to the presence of moderate background slowing. This slowing is non-specific and is indicative of encephalopathy. Port CXR 7/16:  L IJ CVL with tip over SVC. Enteric tube below hemidiaphragm. ETT above carina.  MRI  7/18: 1.  Normal expected interval evolution of previously identified infarct involving the left basal ganglia/corona radiata. Additionally, previously seen signal abnormalities within the bilateral basal ganglia have also largely resolved relative to previous MRI from 03/24/2016. Similarly, changes at the right parietal cortex have also evolved. No new areas of ischemia or injury identified.  2. Persistent layering debris/hemorrhage within the lateral ventricles, likely related to recent history of meningitis. No Hydrocephalus. Port CXR 7/19: Left basilar opacity unchanged. Endotracheal tube needs to be advanced 2 cm. Continuous EEG 7/20:  Mild diffuse encephalopathy. No electrographic seizures.  LINES/TUBES: OETT 7.5 7/13>> 7/21 L IJ CVL 07/13>> OGT 7/13>> 7/21 Foley 7/19>>         HPI/Subjective: Rapid response last night, bradycardic, hypotensive, hypothermic, transferred to stepdown, minimally responsive  Objective: Vitals:   06/30/16 0615 06/30/16 0700 06/30/16 0800 06/30/16 0822  BP: 94/63 (!) 73/53 (!) 79/54   Pulse: (!) 47 (!) 52 (!) 53 (!) 55  Resp: '12 10 14 13  '$ Temp:    98.2 F (36.8 C)  TempSrc:    Oral  SpO2: 98% 95% 95% 96%  Weight:      Height:        Intake/Output Summary (Last 24 hours) at 06/30/16 0844 Last data filed at 06/30/16 0600  Gross per 24 hour  Intake          1210.83 ml  Output             2501 ml  Net         -1290.17 ml    Exam:  Examination:  General:  chronically ill looking male. Minimally interactive Neuro:  RASS 0. Will interact slowly and speak single words. Followed commands, all Ext HEENT:  Intubated. OGT, PERL pinpoint Cardiovascular:  Normal heart sounds Lungs:  resps even non labored on vent, diffuse  Rhonchi, excellent F/Vt  Abdomen:  soft Musculoskeletal:  No cyanosis. No clubbing    Data Reviewed: I have personally reviewed following labs and imaging studies  Micro Results Recent Results (from the past 240 hour(s))   Culture, blood (Routine X 2) w Reflex to ID Panel     Status: None (Preliminary result)   Collection Time: 06/25/16  7:20 PM  Result Value Ref Range Status   Specimen Description LEFT ANTECUBITAL  Final   Special Requests IN PEDIATRIC BOTTLE 3CC  Final   Culture NO GROWTH 4 DAYS  Final   Report Status PENDING  Incomplete  Culture, blood (Routine X 2) w Reflex to ID Panel     Status: None (Preliminary result)   Collection Time: 06/25/16  7:27 PM  Result Value Ref Range Status   Specimen Description BLOOD LEFT HAND  Final   Special Requests IN PEDIATRIC BOTTLE 2CC  Final   Culture NO GROWTH 4 DAYS  Final   Report Status PENDING  Incomplete    Radiology Reports Dg Abd 1 View  Result Date: 06/16/2016 CLINICAL DATA:  Orogastric tube placement. EXAM: ABDOMEN - 1 VIEW COMPARISON:  05/25/2016 FINDINGS: An orogastric tube is now seen with tip overlying the distal stomach in the expected region of the pylorus. A left femoral central venous catheter is seen in place as well as a temperature probe within the bladder. The bowel gas pattern is normal. IMPRESSION: Orogastric tube tip overlies the distal stomach, in expected region of pylorus. Electronically Signed   By: Earle Gell M.D.   On: 06/16/2016 20:50   Ct Head Wo Contrast  Result Date: 06/29/2016 CLINICAL DATA:  Altered mental status EXAM: CT HEAD WITHOUT CONTRAST TECHNIQUE: Contiguous axial images were obtained from the base of the skull through the vertex without intravenous contrast. COMPARISON:  MRI brain dated 06/21/2016 FINDINGS: No evidence of parenchymal hemorrhage or extra-axial fluid collection. No mass lesion, mass effect, or midline shift. No CT evidence of acute infarction. Lacunar infarcts in the left corona radiata and basal ganglia. Subcortical white matter and periventricular small vessel ischemic changes. Cerebral volume is within normal limits.  No ventriculomegaly. New complete opacification of the bilateral maxillary sinuses.  Partial opacification of the bilateral ethmoid and sphenoid sinuses. Partial opacification of the right mastoid air cells. No evidence of calvarial fracture. IMPRESSION: No evidence of acute intracranial abnormality. Lacunar infarcts in the left basal ganglia and corona radiata. Electronically Signed   By: Julian Hy M.D.   On: 06/29/2016 16:31  Ct Head Wo Contrast  Result Date: 06/17/2016 CLINICAL DATA:  Unresponsive EXAM: CT HEAD WITHOUT CONTRAST TECHNIQUE: Contiguous axial images were obtained from the base of the skull through the vertex without intravenous contrast. COMPARISON:  05/19/2016 FINDINGS: The bony calvarium is intact. Increased mucosal thickening is noted within the ethmoid and sphenoid sinus. Air-fluid levels are noted within the maxillary antra bilaterally consistent with a more acute degree of sinusitis. Lacunar infarct is noted within the left basal ganglia and left thalamus extending into the deep white matter new from the prior exam. No findings to suggest acute hemorrhage are noted. IMPRESSION: New areas of subacute to chronic ischemia on the left. Progression in the degree of sinusitis as described. Electronically Signed  By: Inez Catalina M.D.   On: 06/17/2016 13:06   Mr Jeri Cos GY Contrast  Result Date: 06/21/2016 CLINICAL DATA:  Initial evaluation for recurrent altered mental status, seizures, history of strep pneumo meningitis. Question new areas of and within the left basal ganglia. EXAM: MRI HEAD WITHOUT AND WITH CONTRAST TECHNIQUE: Multiplanar, multiecho pulse sequences of the brain and surrounding structures were obtained without and with intravenous contrast. CONTRAST:  71m MULTIHANCE GADOBENATE DIMEGLUMINE 529 MG/ML IV SOLN COMPARISON:  Comparison made with prior CT from 06/17/2016 as well as previous MRI from 03/24/2016. FINDINGS: Diffuse prominence of the CSF containing spaces compatible with generalized cerebral atrophy. There has been normal interval expected  evolution of previously identified acute ischemic infarct involving the left corona radiata/basal ganglia, which now demonstrates progressive encephalomalacia without significant diffusion abnormality. Question minimal faint enhancement within this region extending towards the left cerebral peduncle, likely related to the late subacute ischemia (series 11, image 28). Additional remote infarctions involving the bilateral basal ganglia again seen. Chronic blood products again noted within the left external capsule. No new areas of restricted diffusion to suggest new ischemia or injury. Similar FLAIR signal abnormality at the right parietal cortex. Associated subtle laminar necrosis within this region (series 3, image 6). No significant enhancement now seen within this region. Additional focus of cortical flares abnormality within the right frontal lobe slightly more prominent as compared to previous exam, although this appears to be chronic in nature. No appreciable enhancement within these areas. No other abnormal enhancement elsewhere within the brain. Major intracranial vascular flow voids are preserved. No mass lesion, mass effect, or midline shift. Layering susceptibility artifact within the stent was of both lateral ventricles, likely debris/chronic hemorrhage related to prior meningitis, similar to prior. No hydrocephalus. Craniocervical junction within normal limits. Visualized upper cervical spine unremarkable. Pituitary gland normal. No acute abnormality about the globes and orbits. Extensive opacity throughout the paranasal sinuses with fluid levels within the sphenoid and maxillary sinuses. Bilateral mastoid effusions. Fluid within the nasopharynx. Patient likely intubated. Bone marrow signal intensity within normal limits. No scalp soft tissue abnormality. IMPRESSION: 1. Normal expected interval evolution of previously identified infarct involving the left basal ganglia/corona radiata. Additionally,  previously seen signal abnormalities within the bilateral basal ganglia have also largely resolved relative to previous MRI from 03/24/2016. Similarly, changes at the right parietal cortex have also evolved. No new areas of ischemia or injury identified. 2. Persistent layering debris/hemorrhage within the lateral ventricles, likely related to recent history of meningitis. No hydrocephalus. Electronically Signed   By: BJeannine BogaM.D.   On: 06/21/2016 04:55   Dg Chest Port 1 View  Result Date: 06/25/2016 CLINICAL DATA:  Pneumonia. EXAM: PORTABLE CHEST 1 VIEW COMPARISON:  06/22/2016. FINDINGS: Interval extubation. Left IJ central line tip projects over the SVC. Heart size normal. Left lower lobe collapse/ consolidation. Hazy opacification over the lower hemithoraces bilaterally, new on the right. IMPRESSION: 1. Left lower lobe collapse/consolidation. 2. Hazy opacification over both lower hemithoraces may be due to layering pleural fluid and/or true airspace opacification. Electronically Signed   By: MLorin PicketM.D.   On: 06/25/2016 10:13   Dg Chest Port 1 View  Result Date: 06/22/2016 CLINICAL DATA:  Respiratory failure, acute encephalopathy, cardiogenic shock, acute and chronic CHF, current smoker. EXAM: PORTABLE CHEST 1 VIEW COMPARISON:  Portable chest x-ray of June 19, 2016 FINDINGS: The right lung is adequately inflated and clear. On the left there is persistent basilar atelectasis or pneumonia with small  pleural effusion. The heart is normal in size. The pulmonary vascularity is not engorged. The endotracheal tube tip lies 5.3 cm above the carina. The esophagogastric tube tip projects below the inferior margin of the image. The left internal jugular venous catheter tip projects over the proximal third of the SVC. IMPRESSION: Persistent left lower lobe atelectasis or pneumonia with trace pleural effusion. No CHF. The support tubes are in stable position. Electronically Signed   By: David   Martinique M.D.   On: 06/22/2016 07:20   Dg Chest Portable 1 View  Result Date: 06/19/2016 CLINICAL DATA:  Respiratory failure EXAM: PORTABLE CHEST 1 VIEW COMPARISON:  06/18/2016 FINDINGS: ET tube tip is above the carina. There is a left IJ catheter with tip in the projection of the SVC. Enteric tube tip is below the hemidiaphragm. Persistent opacity within the left lung base. Right lung is clear. IMPRESSION: 1. Stable position of the support apparatus. 2. No change in left base opacity. Electronically Signed   By: Kerby Moors M.D.   On: 06/19/2016 07:44   Dg Chest Port 1 View  Result Date: 06/18/2016 CLINICAL DATA:  50 year old male with a history of respiratory failure EXAM: PORTABLE CHEST 1 VIEW COMPARISON:  06/17/2016, 06/16/2016 FINDINGS: Cardiomediastinal silhouette unchanged in size and contour. Endotracheal tube unchanged, terminating 3.8 cm above the carina. Unchanged left IJ approach central venous catheter, appearing to terminate superior vena cava. Gastric tube projects over the mediastinum, terminating out of the field of view. No visualized pneumothorax. Improved aeration in the left mid lung, with persistent opacity in the retrocardiac region. Blunting of left costophrenic angle. IMPRESSION: Improved aeration of the left lung, with persistent left basilar opacity, potentially consolidation, edema, and/ or atelectasis. Small left pleural effusion not excluded. Unchanged position of endotracheal tube, gastric tube, left IJ central venous catheter. Signed, Dulcy Fanny. Earleen Newport, DO [facility] Electronically Signed   By: Corrie Mckusick D.O.   On: 06/18/2016 10:15   Dg Chest Port 1 View  Result Date: 06/17/2016 CLINICAL DATA:  50 year old male with enteric tube placement. EXAM: PORTABLE CHEST 1 VIEW COMPARISON:  A abdominal radiograph dated 06/16/2016 FINDINGS: An endotracheal tube is noted with tip approximately 3 cm above the carina. Single  portable view of the chest demonstrates patchy area of hazy airspace opacity in the left mid to lower lung field. There is no pleural effusion or pneumothorax. The cardiac silhouette is within normal limits. Left IJ central line with tip over central SVC. An enteric tube extends into the epigastric area with tip superimposed over the right L3 pedicle likely in the distal stomach. There is no bowel dilatation or evidence of obstruction. No free air. No radiopaque calculi or foreign object. The soft tissues and osseous structures appear unremarkable. The previously seen left femoral central venous catheter has been removed. A temperature probe is partially visualized over the bladder. IMPRESSION: Endotracheal tube above the carina. Patchy area of airspace density in the left mid to lower lung field. Follow-up recommended. Enteric tube in the distal stomach.  No bowel obstruction. Electronically Signed   By: Anner Crete M.D.   On: 06/17/2016 02:48   Dg Chest Port 1 View  Result Date: 06/16/2016 CLINICAL DATA:  Endotracheal tube placement EXAM: PORTABLE CHEST 1 VIEW COMPARISON:  06/16/2016 FINDINGS: There is an endotracheal tube with the tip 3.7 cm above the carina. Nasogastric tube coursing below the diaphragm. There is hazy left lower lobe airspace disease. There is no pleural effusion or  pneumothorax. The heart and mediastinal contours are unremarkable. The osseous structures are unremarkable. IMPRESSION: 1. Hazy left lower lobe airspace disease concerning for pneumonia. 2. Endotracheal tube with the tip 3.7 cm above the carina. Electronically Signed   By: Kathreen Devoid   On: 06/16/2016 22:41   Dg Chest Port 1 View  Result Date: 06/16/2016 CLINICAL DATA:  Intubation.  Followup pneumonia. EXAM: PORTABLE CHEST 1 VIEW 8:24 p.m.: COMPARISON:  Portable chest x-ray earlier same date 5:49 p.m. and previously. FINDINGS: Endotracheal tube tip in satisfactory position projecting approximately 6 cm above the carina.  Nasogastric tube courses below the diaphragm into the stomach though its tip is not included on the image. Consolidation in the left lower lobe as noted earlier. Developing patchy opacities at the right lung base. IMPRESSION: 1. Support apparatus satisfactory. Endotracheal tube tip approximately 6 cm above the carina. Nasogastric tube courses below the diaphragm into the stomach. 2. Left lower lobe pneumonia as noted earlier. Developing atelectasis and/or pneumonia involving the right lung base. Electronically Signed   By: Evangeline Dakin M.D.   On: 06/16/2016 20:49   Dg Chest Port 1 View  Result Date: 06/16/2016 CLINICAL DATA:  50 year old with possible aspiration and sepsis. EXAM: PORTABLE CHEST 1 VIEW COMPARISON:  05/29/2016, 05/27/2016 and earlier. FINDINGS: Patient is rotated to the left. Cardiomediastinal silhouette unremarkable, unchanged. Airspace consolidation in the retrocardiac left lung base. Lungs otherwise clear. Pulmonary vascularity normal. No visible pleural effusions. IMPRESSION: Left lower lobe pneumonia. Electronically Signed   By: Evangeline Dakin M.D.   On: 06/16/2016 18:22   Dg Abd Portable 1v  Result Date: 06/17/2016 CLINICAL DATA:  50 year old male with enteric tube placement. EXAM: PORTABLE CHEST 1 VIEW COMPARISON:  A abdominal radiograph dated 06/16/2016 FINDINGS: An endotracheal tube is noted with tip approximately 3 cm above the carina. Single portable view of the chest demonstrates patchy area of hazy airspace opacity in the left mid to lower lung field. There is no pleural effusion or pneumothorax. The cardiac silhouette is within normal limits. Left IJ central line with tip over central SVC. An enteric tube extends into the epigastric area with tip superimposed over the right L3 pedicle likely in the distal stomach. There is no bowel dilatation or evidence of obstruction. No free air. No radiopaque calculi or foreign object. The soft tissues and osseous structures appear  unremarkable. The previously seen left femoral central venous catheter has been removed. A temperature probe is partially visualized over the bladder. IMPRESSION: Endotracheal tube above the carina. Patchy area of airspace density in the left mid to lower lung field. Follow-up recommended. Enteric tube in the distal stomach.  No bowel obstruction. Electronically Signed   By: Anner Crete M.D.   On: 06/17/2016 02:48   Dg Swallowing Func-speech Pathology  Result Date: 06/29/2016 Objective Swallowing Evaluation: Type of Study: MBS-Modified Barium Swallow Study Patient Details Name: MALE MINISH MRN: [id number] Date of Birth: 05/26/1966 Today's Date: 06/29/2016 Time: SLP Start Time (ACUTE ONLY): 1215-SLP Stop Time (ACUTE ONLY): 1235 SLP Time Calculation (min) (ACUTE ONLY): 20 min Past Medical History: Past Medical History: Diagnosis Date . Acute encephalopathy  . Acute ischemic stroke ([place])  . Acute renal failure ([place])  . Acute respiratory failure with hypoxia ([place])  . AKI (acute kidney injury) ([place]) 02/16/2016 . Altered mental status  . Cardiac arrest ([place])  . Cardiogenic shock ([place])  . 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 Cerebral thrombosis with cerebral infarction 03/25/2016 . CHF (congestive heart failure) ([place])  .  Convulsions/seizures ([place]) 05/04/2016  Seizure disorder - Continue Keppra, Depakote.    . Dementia 04/27/2016 . Depression  . Dysphagia 05/04/2016  Dysphagia - Dysphagia 1 diet per SLP    . ETOH abuse  . Hydrocephalus  . Hyperlipidemia 05/04/2016  Dyslipidemia - Continue statin.    Marland Kitchen Hypernatremia  . Hypothermia 03/20/2016 . Low serum cortisol level (HCC) 05/04/2016  Low a.m. cortisol  - Was 1.2 checked on 04/03/2016  - This likely is reflective of cortisol suppression from steroid use - Tapered steroids off  . Meningitis, pneumococcal, recurrent  . Meningitis, streptococcal 02/16/2016 . Meningoencephalitis  . NSTEMI (non-ST elevated myocardial infarction) ([place]) 03/14/2016 . Protein-calorie malnutrition, severe  ([place])  . Respiratory failure ([place]) 03/03/2016 . Septic shock ([place])  . Severe sepsis ([place])  . 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 Substance abuse  . Systolic and diastolic CHF, chronic ([place])  . Tobacco abuse 02/16/2016 . Trichomonal urethritis in male 02/16/2016 . Urinary retention 05/04/2016  Urinary retention - Continue Flomax    Past Surgical History: Past Surgical History: Procedure Laterality Date . ANKLE SURGERY   . CARDIAC CATHETERIZATION N/A 02/17/2016  Procedure: IABP Insertion;  Surgeon: Wellington Hampshire, MD;  Location: [place] [facility];  Service: Cardiovascular;  Laterality: N/A; . 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  Re-admitted 7/13 again presenting with AMS thought to be septic/PNA in origin but had witnessed seizure in ED.  SLP evaluations during previous admissions have recommended Dys 1 diet and nectar thick liquids with advancement to Dys 2 textures prior to discharge.  Intubated 7/13-21.  Subjective: Pt kept eyes closed during MBS. Appeared lethargic, but accepted po presentations Assessment / Plan / Recommendation CHL IP CLINICAL IMPRESSIONS 06/29/2016 Therapy Diagnosis Mild oral phase dysphagia;Moderate pharyngeal phase dysphagia Clinical Impression Pt presents with mild oral, moderate pharyngeal phase dysphagia, characterized by poor bolus formation and posterior propulsion, resulting in posterior spillage to the vallecula on nectar, honey, and puree consistencies, and at the pyriform sinus on thin liquids. Silent aspiration was noted during the swallow of thin liquids. No  penetration  or aspiration was observed on nectar, honey, or puree consistencies, however, risk of episodic aspiration of all consistencies is possible, given poor oral prep and delayed swallow reflex. Solid consistencies were not provided at this time, due to extended ineffective oral bolus formation and propulsion and associated risk of aspiration with fatigue.  Recommend Puree diet and nectar thick liquids with full supervision. Pt needs to be monitored closely during meals to insure oral cavity is clear before provided the next bolus. Safe swallow precautions were sent back to room with pt. Impact on safety and function Moderate aspiration risk   CHL IP TREATMENT RECOMMENDATION 06/29/2016 Treatment Recommendations Therapy as outlined in treatment plan below   Prognosis 06/29/2016 Prognosis for Safe Diet Advancement Fair Barriers to Reach Goals Cognitive deficits CHL IP DIET RECOMMENDATION 06/29/2016 SLP Diet Recommendations Dysphagia 1 (Puree) solids;Nectar thick liquid Liquid Administration via Straw;Cup Medication Administration Crushed with puree Compensations Slow rate;Small sips/bites;Minimize environmental distractions Postural Changes Remain semi-upright after after feeds/meals (Comment);Seated upright at 90 degrees   CHL IP OTHER RECOMMENDATIONS 06/29/2016 Recommended Consults -- Oral Care Recommendations Oral care  BID Other Recommendations Order thickener from pharmacy;Prohibited food (jello, ice cream, thin soups);Remove water pitcher   CHL IP FOLLOW UP RECOMMENDATIONS 06/29/2016 Follow up Recommendations Skilled Nursing facility   [facility] IP FREQUENCY AND DURATION 06/29/2016 Speech Therapy Frequency (ACUTE ONLY) min 2x/week Treatment Duration 2 weeks    CHL IP ORAL PHASE 06/29/2016 Oral Phase Impaired Oral - Honey Teaspoon Premature spillage;Reduced posterior propulsion Oral - Nectar Teaspoon Premature spillage;Reduced posterior propulsion;Delayed oral transit Oral - Nectar Straw Premature spillage;Reduced  posterior propulsion;Delayed oral transit Oral - Thin Straw Premature spillage;Reduced posterior propulsion;Delayed oral transit Oral - Puree Premature spillage;Delayed oral transit;Reduced posterior propulsion  CHL IP CERVICAL ESOPHAGEAL PHASE 06/29/2016 Cervical Esophageal Phase [facility] Shonna Chock 06/29/2016, 1:27 PM Celia B. [place], [facility], CCC-SLP [phone]                CBC  Recent Labs Lab 06/25/16 0525 06/27/16 0533 06/28/16 0548 06/29/16 0418 06/30/16 0638  WBC 13.2* 7.8 9.8 11.8* 6.7  HGB 7.3* 7.2* 8.6* 8.1* 8.0*  HCT 22.9* 22.1* 27.7* 25.4* 24.3*  PLT 139* 222 283 277 277  MCV 87.7 88.0 89.9 90.1 87.4  MCH 28.0 28.7 27.9 28.7 28.8  MCHC 31.9 32.6 31.0 31.9 32.9  RDW 16.4* 16.1* 16.4* 16.7* 16.6*    Chemistries   Recent Labs Lab 06/24/16 0510 06/25/16 0525 06/26/16 0537 06/27/16 0533 06/28/16 0548 06/29/16 0418 06/30/16 0638  NA 146* 146* 146* 145 147* 147* 145  K 3.5 3.8 3.8 3.3* 3.5 3.9 3.3*  CL 111 111 110 108 107 106 105  CO2 28 28 32 33* 32 33* 32  GLUCOSE 117* 84 97 82 77 78 74  BUN '17 18 17 17 16 13 9  '$ CREATININE 0.84 1.12 0.99 0.96 0.91 0.81 0.82  CALCIUM 8.0* 8.0* 8.3* 8.0* 8.7* 8.9 8.3*  MG 2.0 1.8  --   --  2.2 1.9  --   AST  --   --   --   --   --  14* 16  ALT  --   --   --   --   --  19 18  ALKPHOS  --   --   --   --   --  51 46  BILITOT  --   --   --   --   --  0.3 0.4   ------------------------------------------------------------------------------------------------------------------ estimated creatinine clearance is 92.3 mL/min (by C-G formula based on SCr of 0.82 mg/dL). ------------------------------------------------------------------------------------------------------------------ No results for input(s): HGBA1C in the last 72 hours. ------------------------------------------------------------------------------------------------------------------ No results for input(s): CHOL, HDL, LDLCALC, TRIG, CHOLHDL, LDLDIRECT in the last 72  hours. ------------------------------------------------------------------------------------------------------------------ No results for input(s): TSH, T4TOTAL, T3FREE, THYROIDAB in the last 72 hours.  Invalid input(s): FREET3 ------------------------------------------------------------------------------------------------------------------ No results for input(s): VITAMINB12, FOLATE, FERRITIN, TIBC, IRON, RETICCTPCT in the last 72 hours.  Coagulation profile No results for input(s): INR, PROTIME in the last 168 hours.  No results for input(s): DDIMER in the last 72 hours.  Cardiac Enzymes No results for input(s): CKMB, TROPONINI, MYOGLOBIN in the last 168 hours.  Invalid input(s): CK ------------------------------------------------------------------------------------------------------------------ Invalid input(s): POCBNP   CBG:  Recent Labs Lab 06/29/16 1427 06/29/16 1651 06/29/16 2041 06/29/16 2230 06/30/16 0328  GLUCAP 83 70 61* 189* 80       Studies: Ct Head Wo Contrast  Result Date: 06/29/2016 CLINICAL DATA:  Altered mental status EXAM: CT HEAD WITHOUT CONTRAST TECHNIQUE: Contiguous axial images were obtained from the base of the skull through the vertex without intravenous contrast. COMPARISON:  MRI brain dated 06/21/2016 FINDINGS: No evidence of parenchymal hemorrhage or extra-axial fluid collection. No mass lesion, mass effect, or midline shift. No CT evidence of acute infarction. Lacunar infarcts in the left corona radiata and basal ganglia. Subcortical white matter and periventricular small vessel ischemic changes. Cerebral volume is within normal limits.  No ventriculomegaly. New complete opacification of the bilateral maxillary sinuses. Partial opacification of the bilateral ethmoid and sphenoid sinuses. Partial opacification of the right mastoid air cells. No evidence of calvarial fracture. IMPRESSION: No evidence of acute intracranial abnormality. Lacunar infarcts  in the left basal ganglia and corona radiata. Electronically Signed   By: Julian Hy M.D.   On: 06/29/2016 16:31  Dg Swallowing Func-speech Pathology  Result Date: 06/29/2016 Objective Swallowing Evaluation: Type of Study: MBS-Modified Barium Swallow Study Patient Details Name: DEMARIOUS KAPUR MRN: [id number] Date of Birth: 1966/09/20 Today's Date: 06/29/2016 Time: SLP Start Time (ACUTE ONLY): 1215-SLP Stop Time (ACUTE ONLY): 1235 SLP Time Calculation (min) (ACUTE ONLY): 20 min Past Medical History: Past Medical History: Diagnosis Date . Acute encephalopathy  . Acute ischemic stroke ([place])  . Acute renal failure ([place])  . Acute respiratory failure with hypoxia ([place])  . AKI (acute kidney injury) ([place]) 02/16/2016 . Altered mental status  . Cardiac arrest ([place])  . Cardiogenic shock ([place])  . 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 Cerebral thrombosis with cerebral infarction 03/25/2016 . CHF (congestive heart failure) ([place])  . Convulsions/seizures ([place]) 05/04/2016  Seizure disorder - Continue Keppra, Depakote.    . Dementia 04/27/2016 . Depression  . Dysphagia 05/04/2016  Dysphagia - Dysphagia 1 diet per SLP    . ETOH abuse  . Hydrocephalus  . Hyperlipidemia 05/04/2016  Dyslipidemia - Continue statin.    Marland Kitchen Hypernatremia  . Hypothermia 03/20/2016 . Low serum cortisol level (H[facility]) 05/04/2016  Low a.m. cortisol  - Was 1.2 checked on 04/03/2016  - This likely is reflective of cortisol suppression from steroid use - Tapered steroids off  . Meningitis, pneumococcal, recurrent  . Meningitis, streptococcal 02/16/2016 . Meningoencephalitis  . NSTEMI (non-ST elevated myocardial infarction) ([place]) 03/14/2016 . Protein-calorie malnutrition, severe ([place])  . Respiratory failure ([place]) 03/03/2016 . Septic shock ([place])  . Severe sepsis ([place])  . 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 Substance abuse  . Systolic and diastolic CHF, chronic ([place])  . Tobacco abuse 02/16/2016 . Trichomonal urethritis in male 02/16/2016 . Urinary retention 05/04/2016  Urinary  retention - Continue Flomax    Past Surgical History: Past Surgical History: Procedure Laterality Date . ANKLE SURGERY   . CARDIAC CATHETERIZATION N/A 02/17/2016  Procedure: IABP Insertion;  Surgeon: Wellington Hampshire, MD;  Location: [place] CV LAB;  Service: Cardiovascular;  Laterality: N/A; . 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  Re-admitted 7/13 again presenting with AMS thought to be septic/PNA in origin but had witnessed seizure in ED.  SLP evaluations during previous admissions have recommended Dys 1 diet and nectar thick liquids with advancement to Dys 2 textures prior to discharge.  Intubated 7/13-21.  Subjective: Pt kept eyes closed during MBS. Appeared lethargic, but accepted po presentations Assessment / Plan / Recommendation CHL IP CLINICAL IMPRESSIONS 06/29/2016 Therapy Diagnosis Mild oral  phase dysphagia;Moderate pharyngeal phase dysphagia Clinical Impression Pt presents with mild oral, moderate pharyngeal phase dysphagia, characterized by poor bolus formation and posterior propulsion, resulting in posterior spillage to the vallecula on nectar, honey, and puree consistencies, and at the pyriform sinus on thin liquids. Silent aspiration was noted during the swallow of thin liquids. No penetration  or aspiration was observed on nectar, honey, or puree consistencies, however, risk of episodic aspiration of all consistencies is possible, given poor oral prep and delayed swallow reflex. Solid consistencies were not provided at this time, due to extended ineffective oral bolus  formation and propulsion and associated risk of aspiration with fatigue.  Recommend Puree diet and nectar thick liquids with full supervision. Pt needs to be monitored closely during meals to insure oral cavity is clear before provided the next bolus. Safe swallow precautions were sent back to room with pt. Impact on safety and function Moderate aspiration risk   CHL IP TREATMENT RECOMMENDATION 06/29/2016 Treatment Recommendations Therapy as outlined in treatment plan below   Prognosis 06/29/2016 Prognosis for Safe Diet Advancement Fair Barriers to Reach Goals Cognitive deficits CHL IP DIET RECOMMENDATION 06/29/2016 SLP Diet Recommendations Dysphagia 1 (Puree) solids;Nectar thick liquid Liquid Administration via Straw;Cup Medication Administration Crushed with puree Compensations Slow rate;Small sips/bites;Minimize environmental distractions Postural Changes Remain semi-upright after after feeds/meals (Comment);Seated upright at 90 degrees   CHL IP OTHER RECOMMENDATIONS 06/29/2016 Recommended Consults -- Oral Care Recommendations Oral care BID Other Recommendations Order thickener from pharmacy;Prohibited food (jello, ice cream, thin soups);Remove water pitcher   CHL IP FOLLOW UP RECOMMENDATIONS 06/29/2016 Follow up Recommendations Skilled Nursing facility   [facility] IP FREQUENCY AND DURATION 06/29/2016 Speech Therapy Frequency (ACUTE ONLY) min 2x/week Treatment Duration 2 weeks    CHL IP ORAL PHASE 06/29/2016 Oral Phase Impaired Oral - Honey Teaspoon Premature spillage;Reduced posterior propulsion Oral - Nectar Teaspoon Premature spillage;Reduced posterior propulsion;Delayed oral transit Oral - Nectar Straw Premature spillage;Reduced posterior propulsion;Delayed oral transit Oral - Thin Straw Premature spillage;Reduced posterior propulsion;Delayed oral transit Oral - Puree Premature spillage;Delayed oral transit;Reduced posterior propulsion  CHL IP CERVICAL ESOPHAGEAL PHASE 06/29/2016 Cervical Esophageal Phase [facility] Shonna Chock 06/29/2016, 1:27 PM Celia B. [facility], CCC-SLP [phone]                 Lab Results  Component Value Date   HGBA1C 5.3 05/20/2016   HGBA1C 5.8 (H) 03/27/2016   Lab Results  Component Value Date   LDLCALC 92 03/27/2016   CREATININE 0.82 06/30/2016       Scheduled Meds: . aspirin  325 mg Per Tube Daily  . atorvastatin  20 mg Oral q1800  . bethanechol  10 mg Oral Q8H  . chlorhexidine gluconate (SAGE KIT)  15 mL Mouth Rinse BID  . folic acid  1 mg Oral Daily  . hydrocortisone sod succinate (SOLU-CORTEF) inj  100 mg Intravenous Q8H  . insulin aspart  0-15 Units Subcutaneous TID WC  . insulin aspart  0-5 Units Subcutaneous QHS  . levETIRAcetam  1,500 mg Intravenous Q12H  . sodium chloride  1,000 mL Intravenous Once  . thiamine  100 mg Oral Daily  . valproate sodium  750 mg Intravenous Q8H   Continuous Infusions: . 0.9 % sodium chloride with kcl       LOS: 14 days    Time spent: >30 MINS    [facility] Pager [phone]. If 7PM-7AM, please contact night-coverage at [email or web address], password [facility] 06/30/2016, 8:44 AM  LOS: 14 days

## 2016-07-01 LAB — COMPREHENSIVE METABOLIC PANEL
ALT: 19 U/L (ref 17–63)
AST: 16 U/L (ref 15–41)
Albumin: 2.1 g/dL — ABNORMAL LOW (ref 3.5–5.0)
Alkaline Phosphatase: 53 U/L (ref 38–126)
Anion gap: 10 (ref 5–15)
BILIRUBIN TOTAL: 0.5 mg/dL (ref 0.3–1.2)
BUN: 13 mg/dL (ref 6–20)
CHLORIDE: 110 mmol/L (ref 101–111)
CO2: 26 mmol/L (ref 22–32)
CREATININE: 0.98 mg/dL (ref 0.61–1.24)
Calcium: 8.2 mg/dL — ABNORMAL LOW (ref 8.9–10.3)
Glucose, Bld: 91 mg/dL (ref 65–99)
Potassium: 4.4 mmol/L (ref 3.5–5.1)
Sodium: 146 mmol/L — ABNORMAL HIGH (ref 135–145)
TOTAL PROTEIN: 5.1 g/dL — AB (ref 6.5–8.1)

## 2016-07-01 LAB — GLUCOSE, CAPILLARY
GLUCOSE-CAPILLARY: 77 mg/dL (ref 65–99)
GLUCOSE-CAPILLARY: 119 mg/dL — AB (ref 65–99)
Glucose-Capillary: 43 mg/dL — CL (ref 65–99)
Glucose-Capillary: 119 mg/dL — ABNORMAL HIGH (ref 65–99)
Glucose-Capillary: 49 mg/dL — ABNORMAL LOW (ref 65–99)
Glucose-Capillary: 98 mg/dL (ref 65–99)

## 2016-07-01 MED ORDER — DEXTROSE 50 % IV SOLN
INTRAVENOUS | Status: AC
  Administered 2016-07-01: 50 mL via INTRAVENOUS
  Filled 2016-07-01: qty 50

## 2016-07-01 MED ORDER — DEXTROSE 50 % IV SOLN
1.0000 | Freq: Once | INTRAVENOUS | Status: AC
  Administered 2016-07-01: 50 mL via INTRAVENOUS

## 2016-07-01 MED ORDER — DEXTROSE 50 % IV SOLN
25.0000 mL | INTRAVENOUS | Status: AC
  Administered 2016-07-01: 25 mL via INTRAVENOUS

## 2016-07-01 MED ORDER — POTASSIUM CHLORIDE 10 MEQ/100ML IV SOLN
10.0000 meq | INTRAVENOUS | Status: DC
  Administered 2016-07-01: 10 meq via INTRAVENOUS

## 2016-07-01 MED ORDER — DEXTROSE 50 % IV SOLN
INTRAVENOUS | Status: AC
  Administered 2016-07-01: 25 mL via INTRAVENOUS
  Filled 2016-07-01: qty 50

## 2016-07-01 MED ORDER — POTASSIUM CHLORIDE 10 MEQ/100ML IV SOLN
INTRAVENOUS | Status: AC
Start: 2016-07-01 — End: 2016-07-01
  Administered 2016-07-01: 10 meq via INTRAVENOUS
  Filled 2016-07-01: qty 200

## 2016-07-01 MED ORDER — KCL IN DEXTROSE-NACL 20-5-0.45 MEQ/L-%-% IV SOLN
INTRAVENOUS | Status: DC
  Administered 2016-07-01 – 2016-07-03 (×5): via INTRAVENOUS
  Filled 2016-07-01 (×5): qty 1000

## 2016-07-01 NOTE — Progress Notes (Signed)
Physical Therapy Treatment Patient Details Name: Dale [name] MRN: [id number] DOB: 1966/08/31 Today's Date: 07/01/2016    History of Present Illness Patient is a 50 y/o male with hx of pneumonia meningitis, cardiac arrest, dementia, NSTEMI, seizures, CHF, HLD, and cerebral septic emboli with multiple recent admissions (one every month starting in March) and in and out of a SNF presents from [place] with AMS. Found to be in septic shock due to HCAP and seizures. Intubated on arrival 7/13-7/21. Rapid response called due to hypotension and hypothermia, transferred to step down 7/27.    PT Comments    Patient progressing slowly towards PT goals. Pt transferred to step down unit due to hypotension/hypothermia 7/27 but seems to be improving. More alert today as pt trying to climb OOB on PT arrival. Continues to demonstrate decreased initiation, attention and weakness. Tolerated taking a few steps to chair with Mod A of 2 and max directional cues. Impaired sitting balance. Will continue to follow.   Follow Up Recommendations  SNF     Equipment Recommendations  None recommended by PT    Recommendations for Other Services       Precautions / Restrictions Precautions Precautions: Fall Restrictions Weight Bearing Restrictions: No    Mobility  Bed Mobility Overal bed mobility: Needs Assistance;+2 for physical assistance Bed Mobility: Supine to Sit     Supine to sit: Max assist;+2 for physical assistance;+2 for safety/equipment;HOB elevated     General bed mobility comments: Pt with leg over rail and tangled in line upon PT arrival. Initiation noted BLEs but difficulty with mobility. Assist to move LEs, scoot bottom to EOB and for trunk.   Transfers Overall transfer level: Needs assistance Equipment used: Rolling walker (2 wheeled) Transfers: Sit to/from Stand   Stand pivot transfers: Mod assist;+2 physical assistance       General transfer comment: Assist of 2 to boost to  standing with manual assist to place BUEs on walker handles. Narrow BoS. Max directional cues to perform SPT to chair with therapist manuevering RW and assisting with balance.  Right hand weak.  Ambulation/Gait Ambulation/Gait assistance: Mod assist;+2 physical assistance Ambulation Distance (Feet): 3 Feet Assistive device: Rolling walker (2 wheeled)       General Gait Details: Able to take a few shuffled steps to get to chair with posterior lean and assist navigating RW.   Stairs            Wheelchair Mobility    Modified Rankin (Stroke Patients Only)       Balance Overall balance assessment: Needs assistance Sitting-balance support: Feet supported;No upper extremity supported Sitting balance-Leahy Scale: Poor Sitting balance - Comments: Variable assist needed sitting EOB ranging from min guard-Mod A. Posterior lean. Postural control: Posterior lean Standing balance support: During functional activity Standing balance-Leahy Scale: Poor Standing balance comment: Reliant on BUEs and external support for standing.                    Cognition Arousal/Alertness: Awake/alert Behavior During Therapy: Flat affect Overall Cognitive Status: No family/caregiver present to determine baseline cognitive functioning     Current Attention Level: Focused Memory: Decreased short-term memory Following Commands: Follows one step commands inconsistently;Follows one step commands with increased time Safety/Judgement: Decreased awareness of deficits;Decreased awareness of safety Awareness:  (pre-intellectual) Problem Solving: Slow processing;Decreased initiation;Requires verbal cues;Requires tactile cues;Difficulty sequencing General Comments: Nods appropriately twice during session.     Exercises      General Comments General comments (skin integrity, edema,  etc.): BP elevated post transfer. 113/101. RN notified.      Pertinent Vitals/Pain Pain Assessment: Faces Faces  Pain Scale: No hurt    Home Living                      Prior Function            PT Goals (current goals can now be found in the care plan section) Progress towards PT goals: Progressing toward goals (slowly)    Frequency  Min 2X/week    PT Plan Current plan remains appropriate    Co-evaluation             End of Session Equipment Utilized During Treatment: Gait belt Activity Tolerance: Patient limited by fatigue Patient left: with call bell/phone within reach;in chair;with chair alarm set;with nursing/sitter in room     Time: 1121-1144 PT Time Calculation (min) (ACUTE ONLY): 23 min  Charges:  $Therapeutic Activity: 23-37 mins                    G Codes:      Swannie Milius A Aariah Godette 07/01/2016, 12:08 PM  Mylo Red, PT, DPT [phone]

## 2016-07-01 NOTE — Progress Notes (Signed)
Patient ID: Dale [name], male   DOB: July 19, 1966, 50 y.o.   MRN: [id number]                                                                PROGRESS NOTE                                                                                                                                                                                                             Patient Demographics:    Dale [name], is a 50 y.o. male, DOB - 12-01-1966, BCT:[id number]  Admit date - 06/16/2016   Admitting Physician Kalman Shan, MD  Outpatient Primary MD for the patient is No primary care provider on file.  LOS - 15  Outpatient Specialists:    Chief Complaint  Patient presents with  . Aspiration  . Possible Sepsis        Brief Narrative  50 year old male with complex medical history including:  3/14-4/6: Admitted with strep pneumo meningitis, c/b cardiac arrest, and cardiogenic shock (EF 20%, recovered to 50%) briefly on amiodarone, ARDS requiring prolonged intubation and trach (now removed), ARF, shock liver, and adrenal insufficiency. He was discharged on 4/6 with weekly Pen G IM until 4/10. Has been re-admitted several times w/ recurrent infection and seen by ID. Re-admitted 7/13 again presenting with AMS thought to be septic/PNA in origin but had witnessed seizure in ED. He was found to be hypotensive and hypothermic, however WBC and Lactic were wnl. CXR with LLL opacification He required CVL placement and vasopressors for shock despite aggressive IVF resuscitation. ICU admission for shock treatment and continuous EEG. He was intubated in the ED for airway protection and transferred to [facility]. Extubated 7/21. Transferred to [facility] on 7/25. Patient develops signs of adrenal insufficiency again on 7/26 and had to be moved back to stepdown   Subjective:    Dale [name] today opens his eyes when spoken to.  Pt unable to follow direct commands.  No obvious complaints.  Uncertain what baseline mental status is.       Assessment  & Plan :    Active Problems:   Acute encephalopathy   Acute respiratory failure (HCC)   Acute respiratory failure with hypercapnia (HCC)   Encounter for central line placement   Pressure ulcer  Acute Encephalopathy - ongoing. Seems to be slowly improving. Occasionally lethargic. Following commands. Waxing and waning mental status, repeat CT scan on 7/26 did not show any evidence of acute intracranial abnormality. 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  Pt appears to be improving and therefore will be moved back to telemetry  Adrenal insufficiency-manifesting as hypothermia hypoglycemia hypertension- I have restarted high-dose steroids, now on Solu-Cortef 100 mg IV every 8 Cortisol level was 4.3 on admission  Seizures. -strep pneumo meningitis earlier this year, Continue with Keppra and Depakote, dose adjusted during this hospitalization, continue prn Ativan for seizures,Dale [name] appears to be waxing and waning. He has no evidence of any ongoing seizure . Depakote level therapeutic Will need repeat Keppra and Depakote levels in the next 3-4 days  New Subacute Ischemia - extensive workup by neurology as follows H/O strep pneumo meningitis, hydrocephalus, new CVA. Seizure disorder Neurology suspectsmild left upper extremity weakness related to the left basal ganglia infarction.  CT head 7/14:New areas of subacute to chronic ischemia on the left. Progression in the degree of sinusitis as described. EEG 7/14:This was an abnormal EEG due to the presence of moderate background slowing. This slowing is non-specific and is indicative of encephalopathy. MRI 7/18:1. Normal expected interval evolution of previously identified infarct involving the left basal ganglia/corona radiata. Additionally, previously seen signal abnormalities within the bilateral basal ganglia have also largely resolved relative to previous MRI  from 03/24/2016. Similarly, changes at the right parietal cortex have also evolved. No new areas of ischemia orinjury identified.   Persistent layering debris/hemorrhage within the lateral ventricles, likely related to recent history of meningitis. No Hydrocephalus. Continuous EEG 7/20: Mild diffuse encephalopathy. No electrographic seizures. I don't feel that the patient has had any significant improvement in the last 3 days that I have taken care of the patient Remains at high risk for aspiration  Sepsis - Possible HCAP-->completed abx course  h/O strep pneumo meningitis (march 2017). Received repeat doses empiric vanco and zosyn 7/22 due to hypothermia, cx's performed D/Ced abx.   Acute Hypoxic Respiratory Failure Unable to Protect Airway - In setting of seizures.Intubated from 7/13-7/21 HCAP , chest x-ray on 7/22  showed left lower lobe collapse H/O Tracheostomy March 2017 - S/P Decannulation. Mobilize-will need rehabilitation Pulm hygiene     Shock - Resolved. Possibly secondary to sepsis.  Bradycardia >currently sinus in 40's H/O Cardiac Arrest w/ NSTEMI H/O Systolic CHF - EF 45 - 38% per echo from June 2017 H/O Hyperlipidemia Continue aspirin and Lipitor    Hypokalemia - repleted, potassium 3.5 Hyperchloremia - Mild. Urinary retention - Foley replaced.,  Trending UOP with Foley. Bethanechol started 7/18.Continue Flomax   Dysphagia  Dysphagia 1 (puree);Honey-thick liquid based on speech therapy evaluation   Anemia - Chronic. No signs of active bleeding. Hgb 8.0 Leukocytosis - improving Thrombocytopenia - resolved, platelets 277 on the day of discharge Transfuse for Hgb < 7.     Code Status : FULL CODE  Family Communication  :   Disposition Plan  :   Barriers For Discharge :   Consults  :  neurology  Procedures  :   DVT Prophylaxis  :  Lovenox - Heparin - SCDs   Lab Results  Component Value Date   PLT 277 06/30/2016     Antibiotics  :  See below  Anti-infectives    Start     Dose/Rate Route Frequency Ordered Stop   06/25/16 2000  vancomycin (VANCOCIN) IVPB 750 mg/150 ml premix  Status:  Discontinued     750 mg 150 mL/hr over 60 Minutes Intravenous Every 12 hours 06/25/16 1900 06/25/16 1901   06/25/16 2000  piperacillin-tazobactam (ZOSYN) IVPB 3.375 g  Status:  Discontinued     3.375 g 12.5 mL/hr over 240 Minutes Intravenous Every 8 hours 06/25/16 1900 06/26/16 0840   06/25/16 2000  vancomycin (VANCOCIN) IVPB 1000 mg/200 mL premix  Status:  Discontinued     1,000 mg 200 mL/hr over 60 Minutes Intravenous Every 12 hours 06/25/16 1901 06/26/16 0840   06/18/16 2000  vancomycin (VANCOCIN) IVPB 1000 mg/200 mL premix  Status:  Discontinued     1,000 mg 200 mL/hr over 60 Minutes Intravenous Every 12 hours 06/18/16 1935 06/20/16 1458   06/17/16 0800  vancomycin (VANCOCIN) IVPB 750 mg/150 ml premix  Status:  Discontinued     750 mg 150 mL/hr over 60 Minutes Intravenous Every 12 hours 06/16/16 1914 06/18/16 0952   06/17/16 0200  piperacillin-tazobactam (ZOSYN) IVPB 3.375 g  Status:  Discontinued     3.375 g 12.5 mL/hr over 240 Minutes Intravenous Every 8 hours 06/16/16 1940 06/23/16 0832   06/16/16 1945  piperacillin-tazobactam (ZOSYN) IVPB 3.375 g  Status:  Discontinued     3.375 g 12.5 mL/hr over 240 Minutes Intravenous Every 8 hours 06/16/16 1914 06/16/16 1940   06/16/16 1730  vancomycin (VANCOCIN) IVPB 1000 mg/200 mL premix     1,000 mg 200 mL/hr over 60 Minutes Intravenous STAT 06/16/16 1727 06/16/16 2030   06/16/16 1730  piperacillin-tazobactam (ZOSYN) IVPB 3.375 g     3.375 g 100 mL/hr over 30 Minutes Intravenous STAT 06/16/16 1727 06/16/16 1939        Objective:   Vitals:   07/01/16 0801 07/01/16 1000 07/01/16 1100 07/01/16 1214  BP:  118/69 103/68   Pulse:      Resp:      Temp: (!) 96.5 F (35.8 C)   (!) 96.7 F (35.9 C)  TempSrc: Axillary   Axillary  SpO2:      Weight:      Height:         Wt Readings from Last 3 Encounters:  07/01/16 62.6 kg (138 lb)  05/30/16 55.4 kg (122 lb 2.2 oz)  05/09/16 55.8 kg (123 lb)     Intake/Output Summary (Last 24 hours) at 07/01/16 1553 Last data filed at 07/01/16 1100  Gross per 24 hour  Intake          3248.33 ml  Output             1450 ml  Net          1798.33 ml     Physical Exam  Awake Alert, , No new F.N deficits, Normal affect [place].AT,PERRAL Supple Neck,No JVD, No cervical lymphadenopathy appriciated.  Symmetrical Chest wall movement, Good air movement bilaterally, CTAB RRR,No Gallops,Rubs or new Murmurs, No Parasternal Heave +ve B.Sounds, Abd Soft, No tenderness, No organomegaly appriciated, No rebound - guarding or rigidity. No Cyanosis, Clubbing or edema, No new Rash or bruise   Reflexes 2+ symmetric, with downgoing toes bil Motor difficult to assess due to fact pt not following commands well.    Data Review:    CBC  Recent Labs Lab 06/25/16 0525 06/27/16 0533 06/28/16 0548 06/29/16 0418 06/30/16 0638  WBC 13.2* 7.8 9.8 11.8* 6.7  HGB 7.3* 7.2* 8.6* 8.1* 8.0*  HCT 22.9* 22.1* 27.7* 25.4* 24.3*  PLT 139* 222 283 277  277  MCV 87.7 88.0 89.9 90.1 87.4  MCH 28.0 28.7 27.9 28.7 28.8  MCHC 31.9 32.6 31.0 31.9 32.9  RDW 16.4* 16.1* 16.4* 16.7* 16.6*    Chemistries   Recent Labs Lab 06/25/16 0525  06/27/16 0533 06/28/16 0548 06/29/16 0418 06/30/16 0638 07/01/16 0224  NA 146*  < > 145 147* 147* 145 146*  K 3.8  < > 3.3* 3.5 3.9 3.3* 4.4  CL 111  < > 108 107 106 105 110  CO2 28  < > 33* 32 33* 32 26  GLUCOSE 84  < > 82 77 78 74 91  BUN 18  < > '17 16 13 9 13  '$ CREATININE 1.12  < > 0.96 0.91 0.81 0.82 0.98  CALCIUM 8.0*  < > 8.0* 8.7* 8.9 8.3* 8.2*  MG 1.8  --   --  2.2 1.9  --   --   AST  --   --   --   --  14* 16 16  ALT  --   --   --   --  '19 18 19  '$ ALKPHOS  --   --   --   --  51 46 53  BILITOT  --   --   --   --  0.3 0.4 0.5  < > = values in this interval not  displayed. ------------------------------------------------------------------------------------------------------------------ No results for input(s): CHOL, HDL, LDLCALC, TRIG, CHOLHDL, LDLDIRECT in the last 72 hours.  Lab Results  Component Value Date   HGBA1C 5.3 05/20/2016   ------------------------------------------------------------------------------------------------------------------ No results for input(s): TSH, T4TOTAL, T3FREE, THYROIDAB in the last 72 hours.  Invalid input(s): FREET3 ------------------------------------------------------------------------------------------------------------------ No results for input(s): VITAMINB12, FOLATE, FERRITIN, TIBC, IRON, RETICCTPCT in the last 72 hours.  Coagulation profile No results for input(s): INR, PROTIME in the last 168 hours.  No results for input(s): DDIMER in the last 72 hours.  Cardiac Enzymes No results for input(s): CKMB, TROPONINI, MYOGLOBIN in the last 168 hours.  Invalid input(s): CK ------------------------------------------------------------------------------------------------------------------    Component Value Date/Time   BNP 23.8 03/20/2016 1800    Inpatient Medications  Scheduled Meds: . aspirin  325 mg Per Tube Daily  . atorvastatin  20 mg Oral q1800  . bethanechol  10 mg Oral Q8H  . chlorhexidine gluconate (SAGE KIT)  15 mL Mouth Rinse BID  . folic acid  1 mg Oral Daily  . hydrocortisone sod succinate (SOLU-CORTEF) inj  100 mg Intravenous Q8H  . insulin aspart  0-15 Units Subcutaneous TID WC  . insulin aspart  0-5 Units Subcutaneous QHS  . levETIRAcetam  1,500 mg Intravenous Q12H  . thiamine  100 mg Oral Daily  . valproate sodium  750 mg Intravenous Q8H   Continuous Infusions: . 0.9 % sodium chloride with kcl 100 mL/hr at 07/01/16 1353   PRN Meds:.sodium chloride, RESOURCE THICKENUP CLEAR, sodium chloride  Micro Results Recent Results (from the past 240 hour(s))  Culture, blood (Routine X  2) w Reflex to ID Panel     Status: None   Collection Time: 06/25/16  7:20 PM  Result Value Ref Range Status   Specimen Description LEFT ANTECUBITAL  Final   Special Requests IN PEDIATRIC BOTTLE 3CC  Final   Culture NO GROWTH 5 DAYS  Final   Report Status 06/30/2016 FINAL  Final  Culture, blood (Routine X 2) w Reflex to ID Panel     Status: None   Collection Time: 06/25/16  7:27 PM  Result Value Ref Range Status  Specimen Description BLOOD LEFT HAND  Final   Special Requests IN PEDIATRIC BOTTLE [facility]  Final   Culture NO GROWTH 5 DAYS  Final   Report Status 06/30/2016 FINAL  Final    Radiology Reports Dg Abd 1 View  Result Date: 06/16/2016 CLINICAL DATA:  Orogastric tube placement. EXAM: ABDOMEN - 1 VIEW COMPARISON:  05/25/2016 FINDINGS: An orogastric tube is now seen with tip overlying the distal stomach in the expected region of the pylorus. A left femoral central venous catheter is seen in place as well as a temperature probe within the bladder. The bowel gas pattern is normal. IMPRESSION: Orogastric tube tip overlies the distal stomach, in expected region of pylorus. Electronically Signed   By: Myles Rosenthal M.D.   On: 06/16/2016 20:50   Ct Head Wo Contrast  Result Date: 06/29/2016 CLINICAL DATA:  Altered mental status EXAM: CT HEAD WITHOUT CONTRAST TECHNIQUE: Contiguous axial images were obtained from the base of the skull through the vertex without intravenous contrast. COMPARISON:  MRI brain dated 06/21/2016 FINDINGS: No evidence of parenchymal hemorrhage or extra-axial fluid collection. No mass lesion, mass effect, or midline shift. No CT evidence of acute infarction. Lacunar infarcts in the left corona radiata and basal ganglia. Subcortical white matter and periventricular small vessel ischemic changes. Cerebral volume is within normal limits.  No ventriculomegaly. New complete opacification of the bilateral maxillary sinuses. Partial opacification of the bilateral ethmoid and sphenoid  sinuses. Partial opacification of the right mastoid air cells. No evidence of calvarial fracture. IMPRESSION: No evidence of acute intracranial abnormality. Lacunar infarcts in the left basal ganglia and corona radiata. Electronically Signed   By: Charline Bills M.D.   On: 06/29/2016 16:31  Ct Head Wo Contrast  Result Date: 06/17/2016 CLINICAL DATA:  Unresponsive EXAM: CT HEAD WITHOUT CONTRAST TECHNIQUE: Contiguous axial images were obtained from the base of the skull through the vertex without intravenous contrast. COMPARISON:  05/19/2016 FINDINGS: The bony calvarium is intact. Increased mucosal thickening is noted within the ethmoid and sphenoid sinus. Air-fluid levels are noted within the maxillary antra bilaterally consistent with a more acute degree of sinusitis. Lacunar infarct is noted within the left basal ganglia and left thalamus extending into the deep white matter new from the prior exam. No findings to suggest acute hemorrhage are noted. IMPRESSION: New areas of subacute to chronic ischemia on the left. Progression in the degree of sinusitis as described. Electronically Signed   By: Alcide Clever M.D.   On: 06/17/2016 13:06   Mr Laqueta Jean EC Contrast  Result Date: 06/21/2016 CLINICAL DATA:  Initial evaluation for recurrent altered mental status, seizures, history of strep pneumo meningitis. Question new areas of and within the left basal ganglia. EXAM: MRI HEAD WITHOUT AND WITH CONTRAST TECHNIQUE: Multiplanar, multiecho pulse sequences of the brain and surrounding structures were obtained without and with intravenous contrast. CONTRAST:  54mL MULTIHANCE GADOBENATE DIMEGLUMINE 529 MG/ML IV SOLN COMPARISON:  Comparison made with prior CT from 06/17/2016 as well as previous MRI from 03/24/2016. FINDINGS: Diffuse prominence of the CSF containing spaces compatible with generalized cerebral atrophy. There has been normal interval expected evolution of previously identified acute ischemic infarct  involving the left corona radiata/basal ganglia, which now demonstrates progressive encephalomalacia without significant diffusion abnormality. Question minimal faint enhancement within this region extending towards the left cerebral peduncle, likely related to the late subacute ischemia (series 11, image 28). Additional remote infarctions involving the bilateral basal ganglia again seen. Chronic blood products again noted within the  left external capsule. No new areas of restricted diffusion to suggest new ischemia or injury. Similar FLAIR signal abnormality at the right parietal cortex. Associated subtle laminar necrosis within this region (series 3, image 6). No significant enhancement now seen within this region. Additional focus of cortical flares abnormality within the right frontal lobe slightly more prominent as compared to previous exam, although this appears to be chronic in nature. No appreciable enhancement within these areas. No other abnormal enhancement elsewhere within the brain. Major intracranial vascular flow voids are preserved. No mass lesion, mass effect, or midline shift. Layering susceptibility artifact within the stent was of both lateral ventricles, likely debris/chronic hemorrhage related to prior meningitis, similar to prior. No hydrocephalus. Craniocervical junction within normal limits. Visualized upper cervical spine unremarkable. Pituitary gland normal. No acute abnormality about the globes and orbits. Extensive opacity throughout the paranasal sinuses with fluid levels within the sphenoid and maxillary sinuses. Bilateral mastoid effusions. Fluid within the nasopharynx. Patient likely intubated. Bone marrow signal intensity within normal limits. No scalp soft tissue abnormality. IMPRESSION: 1. Normal expected interval evolution of previously identified infarct involving the left basal ganglia/corona radiata. Additionally, previously seen signal abnormalities within the bilateral basal  ganglia have also largely resolved relative to previous MRI from 03/24/2016. Similarly, changes at the right parietal cortex have also evolved. No new areas of ischemia or injury identified. 2. Persistent layering debris/hemorrhage within the lateral ventricles, likely related to recent history of meningitis. No hydrocephalus. Electronically Signed   By: Jeannine Boga M.D.   On: 06/21/2016 04:55   Dg Chest Port 1 View  Result Date: 06/30/2016 CLINICAL DATA:  Altered mental status.  Low body temperature. EXAM: PORTABLE CHEST 1 VIEW COMPARISON:  06/25/2016 FINDINGS: Lungs are adequately inflated as patient is slightly rotated to the right. Interval resolution of left base opacification with improvement but mild residual hazy opacification in the right base possibly due to small layering effusions/atelectasis, although cannot exclude infection cardiomediastinal silhouette and remainder of the exam is unchanged. IMPRESSION: Interval improvement with mild residual hazy opacification in the right base likely small layering effusions/atelectasis although cannot exclude infection. Electronically Signed   By: Marin Olp M.D.   On: 06/30/2016 08:56  Dg Chest Port 1 View  Result Date: 06/25/2016 CLINICAL DATA:  Pneumonia. EXAM: PORTABLE CHEST 1 VIEW COMPARISON:  06/22/2016. FINDINGS: Interval extubation. Left IJ central line tip projects over the SVC. Heart size normal. Left lower lobe collapse/ consolidation. Hazy opacification over the lower hemithoraces bilaterally, new on the right. IMPRESSION: 1. Left lower lobe collapse/consolidation. 2. Hazy opacification over both lower hemithoraces may be due to layering pleural fluid and/or true airspace opacification. Electronically Signed   By: Lorin Picket M.D.   On: 06/25/2016 10:13   Dg Chest Port 1 View  Result Date: 06/22/2016 CLINICAL DATA:  Respiratory failure, acute encephalopathy, cardiogenic shock, acute and chronic CHF, current smoker. EXAM:  PORTABLE CHEST 1 VIEW COMPARISON:  Portable chest x-ray of June 19, 2016 FINDINGS: The right lung is adequately inflated and clear. On the left there is persistent basilar atelectasis or pneumonia with small pleural effusion. The heart is normal in size. The pulmonary vascularity is not engorged. The endotracheal tube tip lies 5.3 cm above the carina. The esophagogastric tube tip projects below the inferior margin of the image. The left internal jugular venous catheter tip projects over the proximal third of the SVC. IMPRESSION: Persistent left lower lobe atelectasis or pneumonia with trace pleural effusion. No CHF. The support tubes are  in stable position. Electronically Signed   By: David  Martinique M.D.   On: 06/22/2016 07:20   Dg Chest Portable 1 View  Result Date: 06/19/2016 CLINICAL DATA:  Respiratory failure EXAM: PORTABLE CHEST 1 VIEW COMPARISON:  06/18/2016 FINDINGS: ET tube tip is above the carina. There is a left IJ catheter with tip in the projection of the SVC. Enteric tube tip is below the hemidiaphragm. Persistent opacity within the left lung base. Right lung is clear. IMPRESSION: 1. Stable position of the support apparatus. 2. No change in left base opacity. Electronically Signed   By: Kerby Moors M.D.   On: 06/19/2016 07:44   Dg Chest Port 1 View  Result Date: 06/18/2016 CLINICAL DATA:  50 year old male with a history of respiratory failure EXAM: PORTABLE CHEST 1 VIEW COMPARISON:  06/17/2016, 06/16/2016 FINDINGS: Cardiomediastinal silhouette unchanged in size and contour. Endotracheal tube unchanged, terminating 3.8 cm above the carina. Unchanged left IJ approach central venous catheter, appearing to terminate superior vena cava. Gastric tube projects over the mediastinum, terminating out of the field of view. No visualized pneumothorax. Improved aeration in the left mid lung, with persistent opacity in the retrocardiac region. Blunting of left costophrenic angle. IMPRESSION: Improved  aeration of the left lung, with persistent left basilar opacity, potentially consolidation, edema, and/ or atelectasis. Small left pleural effusion not excluded. Unchanged position of endotracheal tube, gastric tube, left IJ central venous catheter. Signed, Dulcy Fanny. Earleen Newport, DO [facility] Electronically Signed   By: Corrie Mckusick D.O.   On: 06/18/2016 10:15   Dg Chest Port 1 View  Result Date: 06/17/2016 CLINICAL DATA:  50 year old male with enteric tube placement. EXAM: PORTABLE CHEST 1 VIEW COMPARISON:  A abdominal radiograph dated 06/16/2016 FINDINGS: An endotracheal tube is noted with tip approximately 3 cm above the carina. Single portable view of the chest demonstrates patchy area of hazy airspace opacity in the left mid to lower lung field. There is no pleural effusion or pneumothorax. The cardiac silhouette is within normal limits. Left IJ central line with tip over central SVC. An enteric tube extends into the epigastric area with tip superimposed over the right L3 pedicle likely in the distal stomach. There is no bowel dilatation or evidence of obstruction. No free air. No radiopaque calculi or foreign object. The soft tissues and osseous structures appear unremarkable. The previously seen left femoral central venous catheter has been removed. A temperature probe is partially visualized over the bladder. IMPRESSION: Endotracheal tube above the carina. Patchy area of airspace density in the left mid to lower lung field. Follow-up recommended. Enteric tube in the distal stomach.  No bowel obstruction. Electronically Signed   By: Anner Crete M.D.   On: 06/17/2016 02:48   Dg Chest Port 1 View  Result Date: 06/16/2016 CLINICAL DATA:  Endotracheal tube placement EXAM: PORTABLE CHEST 1 VIEW COMPARISON:  06/16/2016 FINDINGS: There is an endotracheal tube with the tip 3.7 cm above the carina. Nasogastric tube coursing below the diaphragm. There  is hazy left lower lobe airspace disease. There is no pleural effusion or pneumothorax. The heart and mediastinal contours are unremarkable. The osseous structures are unremarkable. IMPRESSION: 1. Hazy left lower lobe airspace disease concerning for pneumonia. 2. Endotracheal tube with the tip 3.7 cm above the carina. Electronically Signed   By: Kathreen Devoid   On: 06/16/2016 22:41   Dg Chest Port 1 View  Result Date: 06/16/2016 CLINICAL DATA:  Intubation.  Followup pneumonia. EXAM: PORTABLE CHEST  1 VIEW 8:24 p.m.: COMPARISON:  Portable chest x-ray earlier same date 5:49 p.m. and previously. FINDINGS: Endotracheal tube tip in satisfactory position projecting approximately 6 cm above the carina. Nasogastric tube courses below the diaphragm into the stomach though its tip is not included on the image. Consolidation in the left lower lobe as noted earlier. Developing patchy opacities at the right lung base. IMPRESSION: 1. Support apparatus satisfactory. Endotracheal tube tip approximately 6 cm above the carina. Nasogastric tube courses below the diaphragm into the stomach. 2. Left lower lobe pneumonia as noted earlier. Developing atelectasis and/or pneumonia involving the right lung base. Electronically Signed   By: Evangeline Dakin M.D.   On: 06/16/2016 20:49   Dg Chest Port 1 View  Result Date: 06/16/2016 CLINICAL DATA:  50 year old with possible aspiration and sepsis. EXAM: PORTABLE CHEST 1 VIEW COMPARISON:  05/29/2016, 05/27/2016 and earlier. FINDINGS: Patient is rotated to the left. Cardiomediastinal silhouette unremarkable, unchanged. Airspace consolidation in the retrocardiac left lung base. Lungs otherwise clear. Pulmonary vascularity normal. No visible pleural effusions. IMPRESSION: Left lower lobe pneumonia. Electronically Signed   By: Evangeline Dakin M.D.   On: 06/16/2016 18:22   Dg Abd Portable 1v  Result Date: 06/17/2016 CLINICAL DATA:  50 year old male with enteric tube placement. EXAM:  PORTABLE CHEST 1 VIEW COMPARISON:  A abdominal radiograph dated 06/16/2016 FINDINGS: An endotracheal tube is noted with tip approximately 3 cm above the carina. Single portable view of the chest demonstrates patchy area of hazy airspace opacity in the left mid to lower lung field. There is no pleural effusion or pneumothorax. The cardiac silhouette is within normal limits. Left IJ central line with tip over central SVC. An enteric tube extends into the epigastric area with tip superimposed over the right L3 pedicle likely in the distal stomach. There is no bowel dilatation or evidence of obstruction. No free air. No radiopaque calculi or foreign object. The soft tissues and osseous structures appear unremarkable. The previously seen left femoral central venous catheter has been removed. A temperature probe is partially visualized over the bladder. IMPRESSION: Endotracheal tube above the carina. Patchy area of airspace density in the left mid to lower lung field. Follow-up recommended. Enteric tube in the distal stomach.  No bowel obstruction. Electronically Signed   By: Anner Crete M.D.   On: 06/17/2016 02:48   Dg Swallowing Func-speech Pathology  Result Date: 06/29/2016 Objective Swallowing Evaluation: Type of Study: MBS-Modified Barium Swallow Study Patient Details Name: NIXXON FARIA MRN: [id number] Date of Birth: 09-11-1966 Today's Date: 06/29/2016 Time: SLP Start Time (ACUTE ONLY): 1215-SLP Stop Time (ACUTE ONLY): 1235 SLP Time Calculation (min) (ACUTE ONLY): 20 min Past Medical History: Past Medical History: Diagnosis Date . Acute encephalopathy  . Acute ischemic stroke ([place])  . Acute renal failure ([place])  . Acute respiratory failure with hypoxia ([place])  . AKI (acute kidney injury) ([place]) 02/16/2016 . Altered mental status  . Cardiac arrest ([place])  . Cardiogenic shock ([place])  . Cerebral septic emboli ([place])  . Cerebral thrombosis with cerebral infarction 03/25/2016 . CHF (congestive heart failure) ([place])  .  Convulsions/seizures ([place]) 05/04/2016  Seizure disorder - Continue Keppra, Depakote.    . Dementia 04/27/2016 . Depression  . Dysphagia 05/04/2016  Dysphagia - Dysphagia 1 diet per SLP    . ETOH abuse  . Hydrocephalus  . Hyperlipidemia 05/04/2016  Dyslipidemia - Continue statin.    Marland Kitchen Hypernatremia  . Hypothermia 03/20/2016 . Low serum cortisol level (HCC) 05/04/2016  Low a.m. cortisol  -  Was 1.2 checked on 04/03/2016  - This likely is reflective of cortisol suppression from steroid use - Tapered steroids off  . Meningitis, pneumococcal, recurrent  . Meningitis, streptococcal 02/16/2016 . Meningoencephalitis  . NSTEMI (non-ST elevated myocardial infarction) (HCC) 03/14/2016 . Protein-calorie malnutrition, severe (HCC)  . Respiratory failure (HCC) 03/03/2016 . Septic shock (HCC)  . Severe sepsis (HCC)  . 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 Substance abuse  . Systolic and diastolic CHF, chronic (HCC)  . Tobacco abuse 02/16/2016 . Trichomonal urethritis in male 02/16/2016 . Urinary retention 05/04/2016  Urinary retention - Continue Flomax    Past Surgical History: Past Surgical History: Procedure Laterality Date . ANKLE SURGERY   . CARDIAC CATHETERIZATION N/A 02/17/2016  Procedure: IABP Insertion;  Surgeon: Iran Ouch, MD;  Location: [facility] INVASIVE CV LAB;  Service: Cardiovascular;  Laterality: N/A; . 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  Re-admitted 7/13 again presenting with AMS thought to be septic/PNA in origin but had witnessed seizure in ED.  SLP evaluations during previous admissions have  recommended Dys 1 diet and nectar thick liquids with advancement to Dys 2 textures prior to discharge.  Intubated 7/13-21.  Subjective: Pt kept eyes closed during MBS. Appeared lethargic, but accepted po presentations Assessment / Plan / Recommendation CHL IP CLINICAL IMPRESSIONS 06/29/2016 Therapy Diagnosis Mild oral phase dysphagia;Moderate pharyngeal phase dysphagia Clinical Impression Pt presents with mild oral, moderate pharyngeal phase dysphagia, characterized by poor bolus formation and posterior propulsion, resulting in posterior spillage to the vallecula on nectar, honey, and puree consistencies, and at the pyriform sinus on thin liquids. Silent aspiration was noted during the swallow of thin liquids. No penetration  or aspiration was observed on nectar, honey, or puree consistencies, however, risk of episodic aspiration of all consistencies is possible, given poor oral prep and delayed swallow reflex. Solid consistencies were not provided at this time, due to extended ineffective oral bolus formation and propulsion and associated risk of aspiration with fatigue.  Recommend Puree diet and nectar thick liquids with full supervision. Pt needs to be monitored closely during meals to insure oral cavity is clear before provided the next bolus. Safe swallow precautions were sent back to room with pt. Impact on safety and function Moderate aspiration risk   CHL IP TREATMENT RECOMMENDATION 06/29/2016 Treatment Recommendations Therapy as outlined in treatment plan below   Prognosis 06/29/2016 Prognosis for Safe Diet Advancement Fair Barriers to Reach Goals Cognitive deficits CHL IP DIET RECOMMENDATION 06/29/2016 SLP Diet Recommendations Dysphagia 1 (Puree) solids;Nectar thick liquid Liquid Administration via Straw;Cup Medication Administration Crushed with puree Compensations Slow rate;Small sips/bites;Minimize environmental distractions Postural Changes Remain semi-upright after after feeds/meals (Comment);Seated  upright at 90 degrees   CHL IP OTHER RECOMMENDATIONS 06/29/2016 Recommended Consults -- Oral Care Recommendations Oral care BID Other Recommendations Order thickener from pharmacy;Prohibited food (jello, ice cream, thin soups);Remove water pitcher   CHL IP FOLLOW UP RECOMMENDATIONS 06/29/2016 Follow up Recommendations Skilled Nursing facility   [facility] IP FREQUENCY AND DURATION 06/29/2016 Speech Therapy Frequency (ACUTE ONLY) min 2x/week Treatment Duration 2 weeks    CHL IP ORAL PHASE 06/29/2016 Oral Phase Impaired Oral - Honey Teaspoon Premature spillage;Reduced posterior propulsion Oral - Nectar  Teaspoon Premature spillage;Reduced posterior propulsion;Delayed oral transit Oral - Nectar Straw Premature spillage;Reduced posterior propulsion;Delayed oral transit Oral - Thin Straw Premature spillage;Reduced posterior propulsion;Delayed oral transit Oral - Puree Premature spillage;Delayed oral transit;Reduced posterior propulsion  CHL IP CERVICAL ESOPHAGEAL PHASE 06/29/2016 Cervical Esophageal Phase [facility] Shonna Chock 06/29/2016, 1:27 PM Celia B. Quentin Ore [facility], CCC-SLP [phone]               Time Spent in minutes  30   Jani Gravel M.D on 07/01/2016 at 3:53 PM  Between 7am to 7pm - Pager - [phone] After 7pm go to [email or web address] - password [facility]  [facility] -  Office  [phone]

## 2016-07-01 NOTE — Progress Notes (Signed)
Subjective:   Dale [name] appears stable today he is still in the cardiac stepdown unit. He was stable overnight. He seems a bit less responsive then 2 days ago. However he is resting comfortably in his chair. He makes a slight attempt to follow a few commands and show 2 fingers. He is presently being treated for adrenal insufficiency. He remains able to move all 4 extremities with some weakness in the left upper extremity.   Exam: Vitals:   07/01/16 1100 07/01/16 1214  BP: 103/68   Pulse:    Resp:    Temp:  (!) 96.7 F (35.9 C)    HEENT-  Normocephalic, no lesions, without obvious abnormality.  Normal external eye and conjunctiva.  Normal TM's bilaterally.  Normal auditory canals and external ears. Normal external nose, mucus membranes and septum.  Normal pharynx. Cardiovascular- regular rate and rhythm, S1, S2 normal, no murmur, click, rub or gallop, pulses palpable throughout   Lungs- chest clear, no wheezing, rales, normal symmetric air entry, Heart exam - S1, S2 normal, no murmur, no gallop, rate regular Abdomen- soft, non-tender; bowel sounds normal; no masses,  no organomegaly Extremities- less then 2 second capillary refill Lymph-no adenopathy palpable Musculoskeletal-no joint tenderness, deformity or swelling Skin-warm and dry, no hyperpigmentation, vitiligo, or suspicious lesions    Gen: In bed, NAD MS: Dale [name] is awake and alert and looking about he does not follow commands quickly but makes a slow attempt to do so he appears able to move all 4 extremities. CN: There is no facial asymmetry. Pupils are equal and reactive. Motor: Antigravity strength of all 4 extremities is noted. Mild weakness of the left upper extremity is present. Sensory: Grossly intact. DTR: 1-2+ throughout.  Pertinent Labs/Diagnostics: Reviewed    Impression:   Dale [name] appears stable since yesterday. He is slightly less responsive than 2 days ago. However he does appear to make an effort to follow a few  simple commands. He continues on steroid replacement for his adrenal insufficiency. He otherwise appears neurologically stable.   Recommendations: 1) continue Keppra and Depakote.  2. We will follow his progress with you  3. Agree with ICU team to recheck Keppra and Depakote levels in 4 days.   Dale [name] A. Hilda Blades, M.D. Neurohospitalist Phone: [phone]   07/01/2016, 2:14 PM

## 2016-07-01 NOTE — Consult Note (Signed)
SLP Cancellation Note  Patient Details Name: Dale [name] MRN: [id number] DOB: October 03, 1966   Cancelled treatment:        Unable to see pt for diet tolerance at this time, as pt is with PT. Will continue efforts. Pt is currently NPO, possibly due to decline in status.  Celia B. [place], [facility], CCC-SLP [phone]  Leigh [place] 07/01/2016, 12:33 PM

## 2016-07-02 DIAGNOSIS — E162 Hypoglycemia, unspecified: Secondary | ICD-10-CM

## 2016-07-02 LAB — COMPREHENSIVE METABOLIC PANEL
ALT: 16 U/L — AB (ref 17–63)
ANION GAP: 6 (ref 5–15)
AST: 19 U/L (ref 15–41)
Albumin: 2.2 g/dL — ABNORMAL LOW (ref 3.5–5.0)
Alkaline Phosphatase: 49 U/L (ref 38–126)
BUN: 14 mg/dL (ref 6–20)
CHLORIDE: 114 mmol/L — AB (ref 101–111)
CO2: 24 mmol/L (ref 22–32)
Calcium: 8.1 mg/dL — ABNORMAL LOW (ref 8.9–10.3)
Creatinine, Ser: 0.89 mg/dL (ref 0.61–1.24)
GFR calc non Af Amer: >60 mL/min (ref >60)
Glucose, Bld: 102 mg/dL — ABNORMAL HIGH (ref 65–99)
Potassium: 4.6 mmol/L (ref 3.5–5.1)
SODIUM: 144 mmol/L (ref 135–145)
Total Bilirubin: 0.5 mg/dL (ref 0.3–1.2)
Total Protein: 4.9 g/dL — ABNORMAL LOW (ref 6.5–8.1)

## 2016-07-02 LAB — GLUCOSE, CAPILLARY
GLUCOSE-CAPILLARY: 104 mg/dL — AB (ref 65–99)
GLUCOSE-CAPILLARY: 62 mg/dL — AB (ref 65–99)
Glucose-Capillary: 104 mg/dL — ABNORMAL HIGH (ref 65–99)
Glucose-Capillary: 63 mg/dL — ABNORMAL LOW (ref 65–99)

## 2016-07-02 LAB — CBC
HCT: 27.7 % — ABNORMAL LOW (ref 39.0–52.0)
Hemoglobin: 8.8 g/dL — ABNORMAL LOW (ref 13.0–17.0)
MCH: 28.6 pg (ref 26.0–34.0)
MCHC: 31.8 g/dL (ref 30.0–36.0)
MCV: 89.9 fL (ref 78.0–100.0)
PLATELETS: 319 10*3/uL (ref 150–400)
RBC: 3.08 MIL/uL — AB (ref 4.22–5.81)
RDW: 17.2 % — ABNORMAL HIGH (ref 11.5–15.5)
WBC: 8.2 10*3/uL (ref 4.0–10.5)

## 2016-07-02 MED ORDER — HYDROCORTISONE NA SUCCINATE PF 100 MG IJ SOLR
50.0000 mg | Freq: Three times a day (TID) | INTRAMUSCULAR | Status: DC
  Administered 2016-07-02 – 2016-07-03 (×3): 50 mg via INTRAVENOUS
  Filled 2016-07-02 (×3): qty 2

## 2016-07-02 NOTE — Progress Notes (Signed)
Speech Language Pathology Treatment: Dysphagia  Patient Details Name: Dale [name] MRN: [id number] DOB: 17-Jun-1966 Today's Date: 07/02/2016 Time: 1005-1030 SLP Time Calculation (min) (ACUTE ONLY): 25 min  Assessment / Plan / Recommendation Clinical Impression  Patient's RN was in the process of feeding patient his breakfast tray. Patient transferred to current unit yesterday (7/28) and was made NPO after transfer, but neither SLP nor current RN knew why. RN stated that she checked notes of his diet that was recommended by SLP following MBS (Dys 1, Nectar), and she started him back on that.  RN fed patient and RN and SLP both gave patient verbal, tactile and visual cues to follow commands to initiate swallow. Patient was able to follow verbal commands, but timeliness of doing so was variable. He exhibited a delayed swallow initiation, delays and decreased manipulation and control of puree boluses, but no overt s/s aspiration. Per recent MBS, concern remains for possible silent aspiration, and since patient is non-verbal and did not make any vocalizations, SLP was unable to assess vocal quality pre and post swallows.    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  Re-admitted 7/13 again presenting with AMS thought to be septic/PNA in origin but had witnessed seizure in ED.  SLP evaluations during previous admissions have recommended Dys 1 diet and nectar thick liquids with advancement to Dys 2 textures prior to discharge.  Intubated 7/13-21.       SLP Plan  Continue with current plan of care     Recommendations  Diet recommendations: Dysphagia 1 (puree);Nectar-thick liquid Liquids provided via: No straw;Cup Medication Administration: Crushed with  puree Supervision: Full supervision/cueing for compensatory strategies;Staff to assist with self feeding Compensations: Slow rate;Small sips/bites;Minimize environmental distractions Postural Changes and/or Swallow Maneuvers: Seated upright 90 degrees             Oral Care Recommendations: Oral care BID Follow up Recommendations: Skilled Nursing facility Plan: Continue with current plan of care     Angela Nevin, MA, CCC-SLP 07/02/16 1:28 PM

## 2016-07-02 NOTE — Progress Notes (Signed)
Pt BP low 60-70's systolic, pt given prn order 500cc NS bolus for systolic <110.

## 2016-07-02 NOTE — Progress Notes (Signed)
Patient ID: THEODEN MAUCH, male   DOB: July 08, 1966, 50 y.o.   MRN: [id number]                                                                PROGRESS NOTE                                                                                                                                                                                                             Patient Demographics:    Lytle Malburg, is a 50 y.o. male, DOB - 1966-01-03, YQM:[id number]  Admit date - 06/16/2016   Admitting Physician Brand Males, MD  Outpatient Primary MD for the patient is No primary care provider on file.  LOS - 16  Outpatient Specialists:  Chief Complaint  Patient presents with  . Aspiration  . Possible Sepsis        Brief Narrative  50 year old male with complex medical history including:  3/14-4/6: Admitted with strep pneumo meningitis, c/b cardiac arrest, and cardiogenic shock (EF 20%, recovered to 50%) briefly on amiodarone, ARDS requiring prolonged intubation and trach (now removed), ARF, shock liver, and adrenal insufficiency. He was discharged on 4/6 with weekly Pen G IM until 4/10. Has been re-admitted several times w/ recurrent infection and seen by ID. Re-admitted 7/13 again presenting with AMS thought to be septic/PNA in origin but had witnessed seizure in ED. He was found to be hypotensive and hypothermic, however WBC and Lactic were wnl. CXR with LLL opacification He required CVL placement and vasopressors for shock despite aggressive IVF resuscitation. ICU admission for shock treatment and continuous EEG. He was intubated in the ED for airway protection and transferred to [facility]. Extubated 7/21. Transferred to [facility] on 7/25.Patient develops signs of adrenal insufficiency again on 7/26 and had to be moved back to stepdown   Subjective:    Zeddie Njie today is more alert,  Apparently able to sit up in bed by himself. Pt tries to answer questions.  Pt afebrile overnite.  Pt had episode of  hypoglycemia yesterday and had to be given amp of D50.   No headache, No chest pain, No abdominal pain - No Nausea, No new weakness tingling or numbness, No Cough /SOB.    Assessment  & Plan :    Active  Problems:   Acute encephalopathy   Acute respiratory failure (HCC)   Acute respiratory failure with hypercapnia (HCC)   Encounter for central line placement   Pressure ulcer   Hypoglycemia On D5 1/2 ns iv, as po intake improves will try to wean off  Acute Encephalopathy - ongoing. Seems to be slowly improving. Occasionally lethargic. Following commands. Waxing and waning mental status, repeat CT scan on 7/26 did not show any evidence of acute intracranial abnormality. 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  Pt appears to be improving and therefore will be moved back to telemetry  Adrenal insufficiency-manifesting as hypothermia hypoglycemia hypertension- Decrease Solu-Cortef 50 mg IV every 8 Cortisol level was 4.3 on admission  Seizures. -strep pneumo meningitis earlier this year, Continue with Keppra and Depakote, dose adjusted during this hospitalization, continue prn Ativan for seizures,Andyn appears to be waxing and waning.He has no evidence of any ongoing seizure . Depakote level therapeutic Will need repeat Keppra and Depakote levels in the next 3 days  New Subacute Ischemia - extensive workup by neurology as follows H/O strep pneumo meningitis, hydrocephalus, new CVA.Seizure disorder Neurology suspectsmild left upper extremity weakness related to the left basal ganglia infarction.  CT head 7/14:New areas of subacute to chronic ischemia on the left. Progression in the degree of sinusitis as described. EEG 7/14:This was an abnormal EEG due to the presence of moderate background slowing. This slowing is non-specific and is indicative of encephalopathy. MRI 7/18:1. Normal expected interval evolution of previously  identified infarct involving the left basal ganglia/corona radiata. Additionally, previously seen signal abnormalities within the bilateral basal ganglia have also largely resolved relative to previous MRI from 03/24/2016. Similarly, changes at the right parietal cortex have also evolved. No new areas of ischemia orinjury identified.  Persistent layering debris/hemorrhage within the lateral ventricles, likely related to recent history of meningitis. No Hydrocephalus.  Continuous EEG 7/20: Mild diffuse encephalopathy. No electrographic seizures. I don't feel that the patient has had any significant improvement in the last 3 days that I have taken care of the patient Remains at high risk for aspiration  Sepsis - Possible HCAP-->completed abx course  h/O strep pneumo meningitis (march 2017). Received repeat doses empiric vanco and zosyn 7/22 due to hypothermia, cx's performed D/Ced abx.   Acute Hypoxic Respiratory Failure Unable to Protect Airway - In setting of seizures.Intubated from 7/13-7/21 HCAP , chest x-ray on 7/22 showed left lower lobe collapse H/O Tracheostomy March 2017 - S/P Decannulation. Mobilize-will need rehabilitation Pulm hygiene   Shock - Resolved. Possibly secondary to sepsis.  Bradycardia >currently sinus in 40's H/O Cardiac Arrest w/ NSTEMI H/O Systolic CHF - EF 45 - 03% per echo from June 2017 H/O Hyperlipidemia Continue aspirin and Lipitor Do not believe that he would be a candidate for pacer at this time due to his comorbidities  Hypokalemia - resolved  Hyperchloremia - Mild.  Urinary retention - Foley replaced.,  Trending UOP with Foley. Bethanechol started 7/18.Continue Flomax  Dysphagia  Dysphagia 1 (puree);Honey-thick liquid based on speech therapy evaluation   Anemia - Chronic. No signs of active bleeding.  Stable,  Check cbc in am  Code Status : FULL CODE  Family Communication  :  Spoke with Wife this am and updated her  on his condition  Disposition Plan  :  Transfer to tele floor today, if doing better consider SNF [facility])  Barriers For Discharge :   Consults  :  neurology  Procedures  :   DVT Prophylaxis  :  Lovenox - Heparin - SCDs   Lab Results  Component Value Date   PLT 319 07/02/2016    Antibiotics  :    Anti-infectives    Start     Dose/Rate Route Frequency Ordered Stop   06/25/16 2000  vancomycin (VANCOCIN) IVPB 750 mg/150 ml premix  Status:  Discontinued     750 mg 150 mL/hr over 60 Minutes Intravenous Every 12 hours 06/25/16 1900 06/25/16 1901   06/25/16 2000  piperacillin-tazobactam (ZOSYN) IVPB 3.375 g  Status:  Discontinued     3.375 g 12.5 mL/hr over 240 Minutes Intravenous Every 8 hours 06/25/16 1900 06/26/16 0840   06/25/16 2000  vancomycin (VANCOCIN) IVPB 1000 mg/200 mL premix  Status:  Discontinued     1,000 mg 200 mL/hr over 60 Minutes Intravenous Every 12 hours 06/25/16 1901 06/26/16 0840   06/18/16 2000  vancomycin (VANCOCIN) IVPB 1000 mg/200 mL premix  Status:  Discontinued     1,000 mg 200 mL/hr over 60 Minutes Intravenous Every 12 hours 06/18/16 1935 06/20/16 1458   06/17/16 0800  vancomycin (VANCOCIN) IVPB 750 mg/150 ml premix  Status:  Discontinued     750 mg 150 mL/hr over 60 Minutes Intravenous Every 12 hours 06/16/16 1914 06/18/16 0952   06/17/16 0200  piperacillin-tazobactam (ZOSYN) IVPB 3.375 g  Status:  Discontinued     3.375 g 12.5 mL/hr over 240 Minutes Intravenous Every 8 hours 06/16/16 1940 06/23/16 0832   06/16/16 1945  piperacillin-tazobactam (ZOSYN) IVPB 3.375 g  Status:  Discontinued     3.375 g 12.5 mL/hr over 240 Minutes Intravenous Every 8 hours 06/16/16 1914 06/16/16 1940   06/16/16 1730  vancomycin (VANCOCIN) IVPB 1000 mg/200 mL premix     1,000 mg 200 mL/hr over 60 Minutes Intravenous STAT 06/16/16 1727 06/16/16 2030   06/16/16 1730  piperacillin-tazobactam (ZOSYN) IVPB 3.375 g     3.375 g 100 mL/hr over 30 Minutes Intravenous STAT  06/16/16 1727 06/16/16 1939        Objective:   Vitals:   07/02/16 0000 07/02/16 0318 07/02/16 0400 07/02/16 0500  BP: 93/65 95/77 117/69 140/85  Pulse: (!) 53 (!) 52 (!) 54 (!) 58  Resp:  14    Temp:  98.9 F (37.2 C)    TempSrc:  Oral    SpO2: 100% 100% 100% 100%  Weight:    62.8 kg (138 lb 6.4 oz)  Height:        Wt Readings from Last 3 Encounters:  07/02/16 62.8 kg (138 lb 6.4 oz)  05/30/16 55.4 kg (122 lb 2.2 oz)  05/09/16 55.8 kg (123 lb)     Intake/Output Summary (Last 24 hours) at 07/02/16 0727 Last data filed at 07/02/16 0600  Gross per 24 hour  Intake           1987.5 ml  Output             1100 ml  Net            887.5 ml     Physical Exam  Awake Alert, Oriented X 1, No new F.N deficits, Normal affect [place].AT,PERRAL Supple Neck,No JVD, No cervical lymphadenopathy appriciated.  Symmetrical Chest wall movement, Good air movement bilaterally, CTAB RRR,No Gallops,Rubs or new Murmurs, No Parasternal Heave +ve B.Sounds, Abd Soft, No tenderness, No organomegaly appriciated, No rebound - guarding or rigidity. No Cyanosis, Clubbing or edema, No new Rash or bruise  Pt  able to close eyes on command and wiggle toes on command, pt does move his bilateral upper extremities.     Data Review:    CBC  Recent Labs Lab 06/27/16 0533 06/28/16 0548 06/29/16 0418 06/30/16 0638 07/02/16 0319  WBC 7.8 9.8 11.8* 6.7 8.2  HGB 7.2* 8.6* 8.1* 8.0* 8.8*  HCT 22.1* 27.7* 25.4* 24.3* 27.7*  PLT 222 283 277 277 319  MCV 88.0 89.9 90.1 87.4 89.9  MCH 28.7 27.9 28.7 28.8 28.6  MCHC 32.6 31.0 31.9 32.9 31.8  RDW 16.1* 16.4* 16.7* 16.6* 17.2*    Chemistries   Recent Labs Lab 06/28/16 0548 06/29/16 0418 06/30/16 0638 07/01/16 0224 07/02/16 0319  NA 147* 147* 145 146* 144  K 3.5 3.9 3.3* 4.4 4.6  CL 107 106 105 110 114*  CO2 32 33* 32 26 24  GLUCOSE 77 78 74 91 102*  BUN _0 CREATININE 0.91 0.81 0.82 0.98 0.89  CALCIUM 8.7* 8.9 8.3* 8.2* 8.1*  MG  2.2 1.9  --   --   --   AST  --  14* _1 ALT  --  _2 16*  ALKPHOS  --  51 46 53 49  BILITOT  --  0.3 0.4 0.5 0.5   ------------------------------------------------------------------------------------------------------------------ No results for input(s): CHOL, HDL, LDLCALC, TRIG, CHOLHDL, LDLDIRECT in the last 72 hours.  Lab Results  Component Value Date   HGBA1C 5.3 05/20/2016   ------------------------------------------------------------------------------------------------------------------ No results for input(s): TSH, T4TOTAL, T3FREE, THYROIDAB in the last 72 hours.  Invalid input(s): FREET3 ------------------------------------------------------------------------------------------------------------------ No results for input(s): VITAMINB12, FOLATE, FERRITIN, TIBC, IRON, RETICCTPCT in the last 72 hours.  Coagulation profile No results for input(s): INR, PROTIME in the last 168 hours.  No results for input(s): DDIMER in the last 72 hours.  Cardiac Enzymes No results for input(s): CKMB, TROPONINI, MYOGLOBIN in the last 168 hours.  Invalid input(s): CK ------------------------------------------------------------------------------------------------------------------    Component Value Date/Time   BNP 23.8 03/20/2016 1800    Inpatient Medications  Scheduled Meds: . aspirin  325 mg Per Tube Daily  . atorvastatin  20 mg Oral q1800  . bethanechol  10 mg Oral Q8H  . chlorhexidine gluconate (SAGE KIT)  15 mL Mouth Rinse BID  . folic acid  1 mg Oral Daily  . hydrocortisone sod succinate (SOLU-CORTEF) inj  100 mg Intravenous Q8H  . insulin aspart  0-15 Units Subcutaneous TID WC  . insulin aspart  0-5 Units Subcutaneous QHS  . levETIRAcetam  1,500 mg Intravenous Q12H  . thiamine  100 mg Oral Daily  . valproate sodium  750 mg Intravenous Q8H   Continuous Infusions: . dextrose 5 % and 0.45 % NaCl with KCl 20 mEq/L 100 mL/hr at 07/02/16 0432   PRN Meds:.sodium  chloride, RESOURCE THICKENUP CLEAR, sodium chloride  Micro Results Recent Results (from the past 240 hour(s))  Culture, blood (Routine X 2) w Reflex to ID Panel     Status: None   Collection Time: 06/25/16  7:20 PM  Result Value Ref Range Status   Specimen Description LEFT ANTECUBITAL  Final   Special Requests IN PEDIATRIC BOTTLE 3CC  Final   Culture NO GROWTH 5 DAYS  Final   Report Status 06/30/2016 FINAL  Final  Culture, blood (Routine X 2) w Reflex to ID Panel     Status: None   Collection Time: 06/25/16  7:27 PM  Result Value Ref Range Status   Specimen Description BLOOD LEFT  HAND  Final   Special Requests IN PEDIATRIC BOTTLE 2CC  Final   Culture NO GROWTH 5 DAYS  Final   Report Status 06/30/2016 FINAL  Final  Culture, blood (routine x 2)     Status: None (Preliminary result)   Collection Time: 06/30/16 10:00 AM  Result Value Ref Range Status   Specimen Description BLOOD RIGHT ARM  Final   Special Requests BOTTLES DRAWN AEROBIC AND ANAEROBIC 10CC  Final   Culture NO GROWTH 1 DAY  Final   Report Status PENDING  Incomplete  Culture, blood (routine x 2)     Status: None (Preliminary result)   Collection Time: 06/30/16 10:10 AM  Result Value Ref Range Status   Specimen Description BLOOD RIGHT HAND  Final   Special Requests   Final    BOTTLES DRAWN AEROBIC AND ANAEROBIC 10CC AER [place] ANA   Culture NO GROWTH 1 DAY  Final   Report Status PENDING  Incomplete    Radiology Reports Dg Abd 1 View  Result Date: 06/16/2016 CLINICAL DATA:  Orogastric tube placement. EXAM: ABDOMEN - 1 VIEW COMPARISON:  05/25/2016 FINDINGS: An orogastric tube is now seen with tip overlying the distal stomach in the expected region of the pylorus. A left femoral central venous catheter is seen in place as well as a temperature probe within the bladder. The bowel gas pattern is normal. IMPRESSION: Orogastric tube tip overlies the distal stomach, in expected region of pylorus. Electronically Signed   By: Earle Gell M.D.   On: 06/16/2016 20:50   Ct Head Wo Contrast  Result Date: 06/29/2016 CLINICAL DATA:  Altered mental status EXAM: CT HEAD WITHOUT CONTRAST TECHNIQUE: Contiguous axial images were obtained from the base of the skull through the vertex without intravenous contrast. COMPARISON:  MRI brain dated 06/21/2016 FINDINGS: No evidence of parenchymal hemorrhage or extra-axial fluid collection. No mass lesion, mass effect, or midline shift. No CT evidence of acute infarction. Lacunar infarcts in the left corona radiata and basal ganglia. Subcortical white matter and periventricular small vessel ischemic changes. Cerebral volume is within normal limits.  No ventriculomegaly. New complete opacification of the bilateral maxillary sinuses. Partial opacification of the bilateral ethmoid and sphenoid sinuses. Partial opacification of the right mastoid air cells. No evidence of calvarial fracture. IMPRESSION: No evidence of acute intracranial abnormality. Lacunar infarcts in the left basal ganglia and corona radiata. Electronically Signed   By: Julian Hy M.D.   On: 06/29/2016 16:31  Ct Head Wo Contrast  Result Date: 06/17/2016 CLINICAL DATA:  Unresponsive EXAM: CT HEAD WITHOUT CONTRAST TECHNIQUE: Contiguous axial images were obtained from the base of the skull through the vertex without intravenous contrast. COMPARISON:  05/19/2016 FINDINGS: The bony calvarium is intact. Increased mucosal thickening is noted within the ethmoid and sphenoid sinus. Air-fluid levels are noted within the maxillary antra bilaterally consistent with a more acute degree of sinusitis. Lacunar infarct is noted within the left basal ganglia and left thalamus extending into the deep white matter new from the prior exam. No findings to suggest acute hemorrhage are noted. IMPRESSION: New areas of subacute to chronic ischemia on the left. Progression in the degree of sinusitis as described. Electronically Signed   By: Inez Catalina M.D.    On: 06/17/2016 13:06   Mr Jeri Cos YY Contrast  Result Date: 06/21/2016 CLINICAL DATA:  Initial evaluation for recurrent altered mental status, seizures, history of strep pneumo meningitis. Question new areas of and within the left basal ganglia. EXAM: MRI  HEAD WITHOUT AND WITH CONTRAST TECHNIQUE: Multiplanar, multiecho pulse sequences of the brain and surrounding structures were obtained without and with intravenous contrast. CONTRAST:  81m MULTIHANCE GADOBENATE DIMEGLUMINE 529 MG/ML IV SOLN COMPARISON:  Comparison made with prior CT from 06/17/2016 as well as previous MRI from 03/24/2016. FINDINGS: Diffuse prominence of the CSF containing spaces compatible with generalized cerebral atrophy. There has been normal interval expected evolution of previously identified acute ischemic infarct involving the left corona radiata/basal ganglia, which now demonstrates progressive encephalomalacia without significant diffusion abnormality. Question minimal faint enhancement within this region extending towards the left cerebral peduncle, likely related to the late subacute ischemia (series 11, image 28). Additional remote infarctions involving the bilateral basal ganglia again seen. Chronic blood products again noted within the left external capsule. No new areas of restricted diffusion to suggest new ischemia or injury. Similar FLAIR signal abnormality at the right parietal cortex. Associated subtle laminar necrosis within this region (series 3, image 6). No significant enhancement now seen within this region. Additional focus of cortical flares abnormality within the right frontal lobe slightly more prominent as compared to previous exam, although this appears to be chronic in nature. No appreciable enhancement within these areas. No other abnormal enhancement elsewhere within the brain. Major intracranial vascular flow voids are preserved. No mass lesion, mass effect, or midline shift. Layering susceptibility artifact  within the stent was of both lateral ventricles, likely debris/chronic hemorrhage related to prior meningitis, similar to prior. No hydrocephalus. Craniocervical junction within normal limits. Visualized upper cervical spine unremarkable. Pituitary gland normal. No acute abnormality about the globes and orbits. Extensive opacity throughout the paranasal sinuses with fluid levels within the sphenoid and maxillary sinuses. Bilateral mastoid effusions. Fluid within the nasopharynx. Patient likely intubated. Bone marrow signal intensity within normal limits. No scalp soft tissue abnormality. IMPRESSION: 1. Normal expected interval evolution of previously identified infarct involving the left basal ganglia/corona radiata. Additionally, previously seen signal abnormalities within the bilateral basal ganglia have also largely resolved relative to previous MRI from 03/24/2016. Similarly, changes at the right parietal cortex have also evolved. No new areas of ischemia or injury identified. 2. Persistent layering debris/hemorrhage within the lateral ventricles, likely related to recent history of meningitis. No hydrocephalus. Electronically Signed   By: BJeannine BogaM.D.   On: 06/21/2016 04:55   Dg Chest Port 1 View  Result Date: 06/30/2016 CLINICAL DATA:  Altered mental status.  Low body temperature. EXAM: PORTABLE CHEST 1 VIEW COMPARISON:  06/25/2016 FINDINGS: Lungs are adequately inflated as patient is slightly rotated to the right. Interval resolution of left base opacification with improvement but mild residual hazy opacification in the right base possibly due to small layering effusions/atelectasis, although cannot exclude infection cardiomediastinal silhouette and remainder of the exam is unchanged. IMPRESSION: Interval improvement with mild residual hazy opacification in the right base likely small layering effusions/atelectasis although cannot exclude infection. Electronically Signed   By: DMarin Olp M.D.   On: 06/30/2016 08:56  Dg Chest Port 1 View  Result Date: 06/25/2016 CLINICAL DATA:  Pneumonia. EXAM: PORTABLE CHEST 1 VIEW COMPARISON:  06/22/2016. FINDINGS: Interval extubation. Left IJ central line tip projects over the SVC. Heart size normal. Left lower lobe collapse/ consolidation. Hazy opacification over the lower hemithoraces bilaterally, new on the right. IMPRESSION: 1. Left lower lobe collapse/consolidation. 2. Hazy opacification over both lower hemithoraces may be due to layering pleural fluid and/or true airspace opacification. Electronically Signed   By: MLorin PicketM.D.   On: 06/25/2016  10:13   Dg Chest Port 1 View  Result Date: 06/22/2016 CLINICAL DATA:  Respiratory failure, acute encephalopathy, cardiogenic shock, acute and chronic CHF, current smoker. EXAM: PORTABLE CHEST 1 VIEW COMPARISON:  Portable chest x-ray of June 19, 2016 FINDINGS: The right lung is adequately inflated and clear. On the left there is persistent basilar atelectasis or pneumonia with small pleural effusion. The heart is normal in size. The pulmonary vascularity is not engorged. The endotracheal tube tip lies 5.3 cm above the carina. The esophagogastric tube tip projects below the inferior margin of the image. The left internal jugular venous catheter tip projects over the proximal third of the SVC. IMPRESSION: Persistent left lower lobe atelectasis or pneumonia with trace pleural effusion. No CHF. The support tubes are in stable position. Electronically Signed   By: David  Martinique M.D.   On: 06/22/2016 07:20   Dg Chest Portable 1 View  Result Date: 06/19/2016 CLINICAL DATA:  Respiratory failure EXAM: PORTABLE CHEST 1 VIEW COMPARISON:  06/18/2016 FINDINGS: ET tube tip is above the carina. There is a left IJ catheter with tip in the projection of the SVC. Enteric tube tip is below the hemidiaphragm. Persistent opacity within the left lung base. Right lung is clear. IMPRESSION: 1. Stable position of the support  apparatus. 2. No change in left base opacity. Electronically Signed   By: Kerby Moors M.D.   On: 06/19/2016 07:44   Dg Chest Port 1 View  Result Date: 06/18/2016 CLINICAL DATA:  50 year old male with a history of respiratory failure EXAM: PORTABLE CHEST 1 VIEW COMPARISON:  06/17/2016, 06/16/2016 FINDINGS: Cardiomediastinal silhouette unchanged in size and contour. Endotracheal tube unchanged, terminating 3.8 cm above the carina. Unchanged left IJ approach central venous catheter, appearing to terminate superior vena cava. Gastric tube projects over the mediastinum, terminating out of the field of view. No visualized pneumothorax. Improved aeration in the left mid lung, with persistent opacity in the retrocardiac region. Blunting of left costophrenic angle. IMPRESSION: Improved aeration of the left lung, with persistent left basilar opacity, potentially consolidation, edema, and/ or atelectasis. Small left pleural effusion not excluded. Unchanged position of endotracheal tube, gastric tube, left IJ central venous catheter. Signed, Dulcy Fanny. Earleen Newport, DO [facility] Electronically Signed   By: Corrie Mckusick D.O.   On: 06/18/2016 10:15   Dg Chest Port 1 View  Result Date: 06/17/2016 CLINICAL DATA:  50 year old male with enteric tube placement. EXAM: PORTABLE CHEST 1 VIEW COMPARISON:  A abdominal radiograph dated 06/16/2016 FINDINGS: An endotracheal tube is noted with tip approximately 3 cm above the carina. Single portable view of the chest demonstrates patchy area of hazy airspace opacity in the left mid to lower lung field. There is no pleural effusion or pneumothorax. The cardiac silhouette is within normal limits. Left IJ central line with tip over central SVC. An enteric tube extends into the epigastric area with tip superimposed over the right L3 pedicle likely in the distal stomach. There is no bowel dilatation or evidence of obstruction. No free  air. No radiopaque calculi or foreign object. The soft tissues and osseous structures appear unremarkable. The previously seen left femoral central venous catheter has been removed. A temperature probe is partially visualized over the bladder. IMPRESSION: Endotracheal tube above the carina. Patchy area of airspace density in the left mid to lower lung field. Follow-up recommended. Enteric tube in the distal stomach.  No bowel obstruction. Electronically Signed   By: Laren Everts.D.  On: 06/17/2016 02:48   Dg Chest Port 1 View  Result Date: 06/16/2016 CLINICAL DATA:  Endotracheal tube placement EXAM: PORTABLE CHEST 1 VIEW COMPARISON:  06/16/2016 FINDINGS: There is an endotracheal tube with the tip 3.7 cm above the carina. Nasogastric tube coursing below the diaphragm. There is hazy left lower lobe airspace disease. There is no pleural effusion or pneumothorax. The heart and mediastinal contours are unremarkable. The osseous structures are unremarkable. IMPRESSION: 1. Hazy left lower lobe airspace disease concerning for pneumonia. 2. Endotracheal tube with the tip 3.7 cm above the carina. Electronically Signed   By: Kathreen Devoid   On: 06/16/2016 22:41   Dg Chest Port 1 View  Result Date: 06/16/2016 CLINICAL DATA:  Intubation.  Followup pneumonia. EXAM: PORTABLE CHEST 1 VIEW 8:24 p.m.: COMPARISON:  Portable chest x-ray earlier same date 5:49 p.m. and previously. FINDINGS: Endotracheal tube tip in satisfactory position projecting approximately 6 cm above the carina. Nasogastric tube courses below the diaphragm into the stomach though its tip is not included on the image. Consolidation in the left lower lobe as noted earlier. Developing patchy opacities at the right lung base. IMPRESSION: 1. Support apparatus satisfactory. Endotracheal tube tip approximately 6 cm above the carina. Nasogastric tube courses below the diaphragm into the stomach. 2. Left lower lobe pneumonia as noted earlier. Developing  atelectasis and/or pneumonia involving the right lung base. Electronically Signed   By: Evangeline Dakin M.D.   On: 06/16/2016 20:49   Dg Chest Port 1 View  Result Date: 06/16/2016 CLINICAL DATA:  50 year old with possible aspiration and sepsis. EXAM: PORTABLE CHEST 1 VIEW COMPARISON:  05/29/2016, 05/27/2016 and earlier. FINDINGS: Patient is rotated to the left. Cardiomediastinal silhouette unremarkable, unchanged. Airspace consolidation in the retrocardiac left lung base. Lungs otherwise clear. Pulmonary vascularity normal. No visible pleural effusions. IMPRESSION: Left lower lobe pneumonia. Electronically Signed   By: Evangeline Dakin M.D.   On: 06/16/2016 18:22   Dg Abd Portable 1v  Result Date: 06/17/2016 CLINICAL DATA:  50 year old male with enteric tube placement. EXAM: PORTABLE CHEST 1 VIEW COMPARISON:  A abdominal radiograph dated 06/16/2016 FINDINGS: An endotracheal tube is noted with tip approximately 3 cm above the carina. Single portable view of the chest demonstrates patchy area of hazy airspace opacity in the left mid to lower lung field. There is no pleural effusion or pneumothorax. The cardiac silhouette is within normal limits. Left IJ central line with tip over central SVC. An enteric tube extends into the epigastric area with tip superimposed over the right L3 pedicle likely in the distal stomach. There is no bowel dilatation or evidence of obstruction. No free air. No radiopaque calculi or foreign object. The soft tissues and osseous structures appear unremarkable. The previously seen left femoral central venous catheter has been removed. A temperature probe is partially visualized over the bladder. IMPRESSION: Endotracheal tube above the carina. Patchy area of airspace density in the left mid to lower lung field. Follow-up recommended. Enteric tube in the distal stomach.  No bowel obstruction. Electronically Signed   By: Anner Crete M.D.   On: 06/17/2016 02:48   Dg Swallowing  Func-speech Pathology  Result Date: 06/29/2016 Objective Swallowing Evaluation: Type of Study: MBS-Modified Barium Swallow Study Patient Details Name: ZOHAR MARONEY MRN: [id number] Date of Birth: 02/28/1966 Today's Date: 06/29/2016 Time: SLP Start Time (ACUTE ONLY): 1215-SLP Stop Time (ACUTE ONLY): 1235 SLP Time Calculation (min) (ACUTE ONLY): 20 min Past Medical History: Past Medical History: Diagnosis Date . Acute encephalopathy  .  Acute ischemic stroke ([place])  . Acute renal failure ([place])  . Acute respiratory failure with hypoxia ([place])  . AKI (acute kidney injury) ([place]) 02/16/2016 . Altered mental status  . Cardiac arrest ([place])  . Cardiogenic shock ([place])  . Cerebral septic emboli ([place])  . Cerebral thrombosis with cerebral infarction 03/25/2016 . CHF (congestive heart failure) ([place])  . Convulsions/seizures ([place]) 05/04/2016  Seizure disorder - Continue Keppra, Depakote.    . Dementia 04/27/2016 . Depression  . Dysphagia 05/04/2016  Dysphagia - Dysphagia 1 diet per SLP    . ETOH abuse  . Hydrocephalus  . Hyperlipidemia 05/04/2016  Dyslipidemia - Continue statin.    Marland Kitchen Hypernatremia  . Hypothermia 03/20/2016 . Low serum cortisol level (HCC) 05/04/2016  Low a.m. cortisol  - Was 1.2 checked on 04/03/2016  - This likely is reflective of cortisol suppression from steroid use - Tapered steroids off  . Meningitis, pneumococcal, recurrent  . Meningitis, streptococcal 02/16/2016 . Meningoencephalitis  . NSTEMI (non-ST elevated myocardial infarction) ([place]) 03/14/2016 . Protein-calorie malnutrition, severe ([place])  . Respiratory failure ([place]) 03/03/2016 . Septic shock ([place])  . Severe sepsis ([place])  . 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 Substance abuse  . Systolic and diastolic CHF, chronic ([place])  . Tobacco abuse 02/16/2016 . Trichomonal urethritis in male 02/16/2016 . Urinary retention 05/04/2016  Urinary retention - Continue Flomax    Past Surgical History: Past Surgical History: Procedure Laterality Date . ANKLE SURGERY   . CARDIAC  CATHETERIZATION N/A 02/17/2016  Procedure: IABP Insertion;  Surgeon: Wellington Hampshire, MD;  Location: [facility];  Service: Cardiovascular;  Laterality: N/A; . TEE WITHOUT CARDIOVERSION N/A 03/29/2016  Procedure: TRANSESOPHAGEAL ECHOCARDIOGRAM (TEE);  Surgeon: Larey Dresser, MD;  Location: [facility] ENDOSCOPY;  Service: Cardiovascular;  Laterality: N/A; HPI: 50 year old male with h/o strep pneumonia meningitis, dysphagia, prior trach (3/17),dementia and hospital course complicated by cardiac arrest and cardiogenic shock in March 2016, ARF, shock liver, adrenal insufficiency presented with acute encephalopathy June admission.  Re-admitted 7/13 again presenting with AMS thought to be septic/PNA in origin but had witnessed seizure in ED.  SLP evaluations during previous admissions have recommended Dys 1 diet and nectar thick liquids with advancement to Dys 2 textures prior to discharge.  Intubated 7/13-21.  Subjective: Pt kept eyes closed during MBS. Appeared lethargic, but accepted po presentations Assessment / Plan / Recommendation CHL IP CLINICAL IMPRESSIONS 06/29/2016 Therapy Diagnosis Mild oral phase dysphagia;Moderate pharyngeal phase dysphagia Clinical Impression Pt presents with mild oral, moderate pharyngeal phase dysphagia, characterized by poor bolus formation and posterior propulsion, resulting in posterior spillage to the vallecula on nectar, honey, and puree consistencies, and at the pyriform sinus on thin liquids. Silent aspiration was noted during the swallow of thin liquids. No penetration  or aspiration was observed on nectar, honey, or puree consistencies, however, risk of episodic aspiration of all consistencies is possible, given poor oral prep and delayed swallow reflex. Solid consistencies were not provided at this time, due to extended ineffective oral bolus formation and propulsion and associated risk of aspiration with fatigue.  Recommend Puree diet and nectar thick liquids with full  supervision. Pt needs to be monitored closely during meals to insure oral cavity is clear before provided the next bolus. Safe swallow precautions were sent back to room with pt. Impact on safety and function Moderate aspiration risk   CHL IP TREATMENT RECOMMENDATION 06/29/2016 Treatment Recommendations Therapy as outlined in treatment plan below   Prognosis 06/29/2016 Prognosis for [place]  Barriers to Reach Goals Cognitive deficits CHL IP DIET RECOMMENDATION 06/29/2016 SLP Diet Recommendations Dysphagia 1 (Puree) solids;Nectar thick liquid Liquid Administration via Straw;Cup Medication Administration Crushed with puree Compensations Slow rate;Small sips/bites;Minimize environmental distractions Postural Changes Remain semi-upright after after feeds/meals (Comment);Seated upright at 90 degrees   CHL IP OTHER RECOMMENDATIONS 06/29/2016 Recommended Consults -- Oral Care Recommendations Oral care BID Other Recommendations Order thickener from pharmacy;Prohibited food (jello, ice cream, thin soups);Remove water pitcher   CHL IP FOLLOW UP RECOMMENDATIONS 06/29/2016 Follow up Recommendations Skilled Nursing facility   [facility] IP FREQUENCY AND DURATION 06/29/2016 Speech Therapy Frequency (ACUTE ONLY) min 2x/week Treatment Duration 2 weeks    CHL IP ORAL PHASE 06/29/2016 Oral Phase Impaired Oral - Honey Teaspoon Premature spillage;Reduced posterior propulsion Oral - Nectar Teaspoon Premature spillage;Reduced posterior propulsion;Delayed oral transit Oral - Nectar Straw Premature spillage;Reduced posterior propulsion;Delayed oral transit Oral - Thin Straw Premature spillage;Reduced posterior propulsion;Delayed oral transit Oral - Puree Premature spillage;Delayed oral transit;Reduced posterior propulsion  CHL IP CERVICAL ESOPHAGEAL PHASE 06/29/2016 Cervical Esophageal Phase [facility] Shonna Chock 06/29/2016, 1:27 PM Celia B. Quentin Ore [facility], CCC-SLP [phone]               Time Spent in minutes  30   Jani Gravel M.D on  07/02/2016 at 7:27 AM  Between 7am to 7pm - Pager - [phone]  After 7pm go to [email or web address] - password [facility]  [facility] -  Office  [phone]

## 2016-07-03 DIAGNOSIS — R001 Bradycardia, unspecified: Secondary | ICD-10-CM

## 2016-07-03 LAB — GLUCOSE, CAPILLARY
GLUCOSE-CAPILLARY: 93 mg/dL (ref 65–99)
GLUCOSE-CAPILLARY: 96 mg/dL (ref 65–99)
GLUCOSE-CAPILLARY: 85 mg/dL (ref 65–99)
GLUCOSE-CAPILLARY: 70 mg/dL (ref 65–99)
GLUCOSE-CAPILLARY: 78 mg/dL (ref 65–99)
Glucose-Capillary: 86 mg/dL (ref 65–99)

## 2016-07-03 LAB — TSH: TSH: 1.004 u[IU]/mL (ref 0.350–4.500)

## 2016-07-03 MED ORDER — LORAZEPAM 2 MG/ML IJ SOLN
INTRAMUSCULAR | Status: AC
  Filled 2016-07-03: qty 1

## 2016-07-03 MED ORDER — HYDROCORTISONE 10 MG PO TABS
10.0000 mg | ORAL_TABLET | Freq: Three times a day (TID) | ORAL | Status: DC
  Administered 2016-07-03 – 2016-07-04 (×3): 10 mg via ORAL
  Filled 2016-07-03 (×4): qty 1

## 2016-07-03 MED ORDER — DIVALPROEX SODIUM 250 MG PO DR TAB
750.0000 mg | DELAYED_RELEASE_TABLET | Freq: Three times a day (TID) | ORAL | Status: DC
  Administered 2016-07-03 (×2): 750 mg via ORAL
  Administered 2016-07-04: 22:00:00 via ORAL
  Administered 2016-07-04 – 2016-07-08 (×12): 750 mg via ORAL
  Filled 2016-07-03 (×2): qty 1
  Filled 2016-07-03 (×2): qty 3
  Filled 2016-07-03: qty 1
  Filled 2016-07-03 (×4): qty 3
  Filled 2016-07-03: qty 1
  Filled 2016-07-03 (×5): qty 3

## 2016-07-03 MED ORDER — SODIUM CHLORIDE 0.9 % IV SOLN
INTRAVENOUS | Status: AC
  Administered 2016-07-03: 15:00:00 via INTRAVENOUS

## 2016-07-03 MED ORDER — LORAZEPAM 2 MG/ML IJ SOLN
0.5000 mg | Freq: Four times a day (QID) | INTRAMUSCULAR | Status: DC | PRN
  Administered 2016-07-03 – 2016-07-10 (×8): 0.5 mg via INTRAVENOUS
  Filled 2016-07-03 (×7): qty 1

## 2016-07-03 MED ORDER — LEVETIRACETAM 500 MG PO TABS
1500.0000 mg | ORAL_TABLET | Freq: Two times a day (BID) | ORAL | Status: DC
  Administered 2016-07-03 – 2016-07-08 (×10): 1500 mg via ORAL
  Filled 2016-07-03 (×10): qty 3

## 2016-07-03 MED ORDER — ENOXAPARIN SODIUM 40 MG/0.4ML ~~LOC~~ SOLN
40.0000 mg | SUBCUTANEOUS | Status: DC
  Administered 2016-07-03 – 2016-07-11 (×8): 40 mg via SUBCUTANEOUS
  Filled 2016-07-03 (×9): qty 0.4

## 2016-07-03 NOTE — Progress Notes (Signed)
Pt increasingly agitated, attempting to get out of bed. Low bed ordered, floor mats placed, band and socks on. MD notified.

## 2016-07-03 NOTE — Progress Notes (Signed)
Subjective:  Dale [name] appears stable. He is not following commands. He looks about. He has spontaneous movement of all 4 extremities. There is no evidence of any new seizure activity. He continues on the Keppra and Depakote.  Exam: Vitals:   07/03/16 0320 07/03/16 0800  BP: (!) 146/85 (!) 145/72  Pulse: (!) 44 (!) 42  Resp: 18   Temp: 97.3 F (36.3 C) 97.2 F (36.2 C)    HEENT-  Normocephalic, no lesions, without obvious abnormality.  Normal external eye and conjunctiva.  Normal TM's bilaterally.  Normal auditory canals and external ears. Normal external nose, mucus membranes and septum.  Normal pharynx. Cardiovascular- regular rate and rhythm, S1, S2 normal, no murmur, click, rub or gallop, pulses palpable throughout   Lungs- chest clear, no wheezing, rales, normal symmetric air entry, Heart exam - S1, S2 normal, no murmur, no gallop, rate regular Abdomen- soft, non-tender; bowel sounds normal; no masses,  no organomegaly Extremities- less then 2 second capillary refill Lymph-no adenopathy palpable Musculoskeletal-no joint tenderness, deformity or swelling Skin-warm and dry, no hyperpigmentation, vitiligo, or suspicious lesions    Gen: In bed, NAD MS: Awake but not following commands. He looks about. CN: No facial asymmetry. Pupils are reactive. Motor: Spontaneous movement of all 4 extremities is noted. Sensory: Sensation is grossly intact. DTR: Reflexes are 1+.  Pertinent Labs/Diagnostics: Reviewed    Impression:   Dale [name] has been reasonably stable. He remains somewhat confused and disoriented. He does not follow commands. He has spontaneous movements of all 4 extremities. He has not experienced any further seizure activity. He continues on the Keppra and Depakote.  Given that Dale [name] has remained seizure free for an extended period, we will sign off at this point. Please reconsult with any new issues.   Recommendations:  1. Dale [name] has appeared stable. He has no seizure  activity. He continues on the Keppra and Depakote. We will sign off at this time. Please reconsult with any new neurological issues.   James A. Hilda Blades, M.D. Neurohospitalist Phone: [phone]   07/03/2016, 9:27 AM

## 2016-07-03 NOTE — Consult Note (Signed)
Reason for Consult:sinus bradycardia  Referring Physician: [name] is an 50 y.o. male.   HPI: The patient is an unfortunate 50 yo man with multiople medical problems. He survived pneumococcal meningitis several months ago. The is a question of adrenal insufficiency. He presented with hypotension and symptoms worrisome for another infection. His baseline neuro status appears to be poor. He does not follow commands and appears to be awake but with very little in the way of purposeful movement. He is on no sinus or av nodal blocking drugs. He has a narrow QRS. He has been noted to have HR's in the low 40's and even the high 30's while he is sleeping.   PMH: Past Medical History:  Diagnosis Date  . Acute encephalopathy   . Acute ischemic stroke (HCC)   . Acute renal failure (HCC)   . Acute respiratory failure with hypoxia (HCC)   . AKI (acute kidney injury) (HCC) 02/16/2016  . Altered mental status   . Cardiac arrest (HCC)   . Cardiogenic shock (HCC)   . Cerebral septic emboli (HCC)   . Cerebral thrombosis with cerebral infarction 03/25/2016  . CHF (congestive heart failure) (HCC)   . Convulsions/seizures (HCC) 05/04/2016   Seizure disorder - Continue Keppra, Depakote.     . Dementia 04/27/2016  . Depression   . Dysphagia 05/04/2016   Dysphagia - Dysphagia 1 diet per SLP     . ETOH abuse   . Hydrocephalus   . Hyperlipidemia 05/04/2016   Dyslipidemia - Continue statin.     Marland Kitchen Hypernatremia   . Hypothermia 03/20/2016  . Low serum cortisol level (HCC) 05/04/2016   Low a.m. cortisol  - Was 1.2 checked on 04/03/2016  - This likely is reflective of cortisol suppression from steroid use - Tapered steroids off   . Meningitis, pneumococcal, recurrent   . Meningitis, streptococcal 02/16/2016  . Meningoencephalitis   . NSTEMI (non-ST elevated myocardial infarction) (HCC) 03/14/2016  . Protein-calorie malnutrition, severe (HCC)   . Respiratory failure (HCC) 03/03/2016  . Septic  shock (HCC)   . Severe sepsis (HCC)   . 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 Substance abuse   . Systolic and diastolic CHF, chronic (HCC)   . Tobacco abuse 02/16/2016  . 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 Urinary retention 05/04/2016   Urinary retention - Continue Flomax       PSHX: Past Surgical History:  Procedure Laterality Date  . ANKLE SURGERY    . CARDIAC CATHETERIZATION N/A 02/17/2016   Procedure: IABP Insertion;  Surgeon: Iran Ouch, MD;  Location: [facility];  Service: Cardiovascular;  Laterality: N/A;  . TEE WITHOUT CARDIOVERSION N/A 03/29/2016   Procedure: TRANSESOPHAGEAL ECHOCARDIOGRAM (TEE);  Surgeon: Laurey Morale, MD;  Location: [facility] ENDOSCOPY;  Service: Cardiovascular;  Laterality: N/A;    FAMHX:History reviewed. No pertinent family history.  Social History:  reports that he has been smoking Cigarettes.  He has been smoking about 0.10 packs per day. He does not have any smokeless tobacco history on file. He reports that he uses drugs, including Marijuana. He reports that he does not drink alcohol.  Allergies: No Known Allergies  Medications: I have reviewed the patient's current medications.  No results found.  ROS  As stated in the HPI and negative for all other systems.  Physical Exam  Vitals:Blood pressure (!) 88/63, pulse (!) 40, temperature 97.2 F (36.2 C), temperature source Axillary, resp. rate 18, height 5\' 8"  (1.727 m),  weight 147 lb 14.4 oz (67.1 kg), SpO2 96 %.  Chronically ill appearing NAD HEENT: Unremarkable Neck:  6 cm JVD, no thyromegally Lymphatics:  No adenopathy Back:  No CVA tenderness Lungs:  Clear with no edema HEART:  Regular brady rhythm, no murmurs, no rubs, no clicks Abd:  soft, positive bowel sounds, no organomegally, no rebound, no guarding Ext:  2 plus pulses, no edema, no cyanosis, no clubbing Skin:  No rashes no nodules Neuro:  Does not follow commands. Moves all extremites well.   ECG - sinus  brady  Assessment/Plan: 1. Sinus bradycardia - I wonder about secondary causes like thyroid and or adrenal insufficiency. He has no symptoms. There is no indication for PPM insertion at this time. No symptoms. Avoid all AV nodal blocking drugs.  2. H/o multiple infections - I think the long term consequences support a poor long term prognosis.   [name] 07/03/2016, 2:28 PM

## 2016-07-03 NOTE — Progress Notes (Signed)
Patient ID: Dale [name], male   DOB: [birth year], 50 y.o.   MRN: [id number]                                                                PROGRESS NOTE                                                                                                                                                                                                             Patient Demographics:    Dale [name], is a 50 y.o. male, DOB - [birth year], WUJ:[id number]  Admit date - 06/16/2016   Admitting Physician Brand Males, MD  Outpatient Primary MD for the patient is No primary care provider on file.  LOS - 17  Outpatient Specialists:  Chief Complaint  Patient presents with  . Aspiration  . Possible Sepsis        Brief Narrative  50 year old male with complex medical history including:  3/14-4/6: Admitted with strep pneumo meningitis, c/b cardiac arrest, and cardiogenic shock (EF 20%, recovered to 50%) briefly on amiodarone, ARDS requiring prolonged intubation and trach (now removed), ARF, shock liver, and adrenal insufficiency. He was discharged on 4/6 with weekly Pen G IM until 4/10. Has been re-admitted several times w/ recurrent infection and seen by ID. Re-admitted 7/13 again presenting with AMS thought to be septic/PNA in origin but had witnessed seizure in ED. He was found to be hypotensive and hypothermic, however WBC and Lactic were wnl. CXR with LLL opacification He required CVL placement and vasopressors for shock despite aggressive IVF resuscitation. ICU admission for shock treatment and continuous EEG. He was intubated in the ED for airway protection and [facility] to [facility]. Extubated 7/21. Transferred to [facility] on 7/25.Patient develops signs of adrenal insufficiency again on 7/26 and had to be moved back to stepdown   Subjective:    Dale [name] today looks more alert.  Able to sit up.  He is a total feed.  Pt has episodes of bradycardia.  Wife requesting cardiology consultation.  No  headache, No chest pain, No abdominal pain - No Nausea, No new weakness tingling or numbness, No Cough - SOB.    Assessment  & Plan :    Active Problems:   Acute encephalopathy   Acute respiratory failure ([place])  Acute respiratory failure with hypercapnia ([place])   Encounter for central line placement   Pressure ulcer   Hypoglycemia   Hypoglycemia Try to wean to ns  Today. Since po intake is improving  Bradycardia >currently sinus in 40's-80's H/O Cardiac Arrest w/ NSTEMI H/O Systolic CHF - EF 45 - 50% per echo from June 2017 H/O Hyperlipidemia Continue aspirin and Lipitor Cardiology consultation much appreciated, requested by family. Probably poor protoplasm for pacer.   Acute Encephalopathy - ongoing. Seems to be slowly improving. Occasionally lethargic. Following commands. Waxing and waning mental status, repeat CT scan on 7/26 did not show any evidence of acute intracranial abnormality. 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 Pt appears to be improving and therefore will be moved back to telemetry  Adrenal insufficiency-manifesting as hypothermia hypoglycemia hypertension- Decrease Solu-Cortef 50 mg IV every 8 => hydrocortisone 1m po tid Cortisol level was 4.3 on admission  Seizures. -strep pneumo meningitis earlier this year, Continue with Keppra and Depakote, dose adjusted during this hospitalization, continue prn Ativan for seizures,[place] appears to be waxing and waning.He has no evidence of any ongoing seizure . Depakote level therapeutic Will need repeat Keppra and Depakote levels tomorrow  New Subacute Ischemia - extensive workup by neurology as follows H/O strep pneumo meningitis, hydrocephalus, new CVA.Seizure disorder Neurology suspectsmild left upper extremity weakness related to the left basal ganglia infarction.  CT head 7/14:New areas of subacute to chronic ischemia on the left. Progression in the  degree of sinusitis as described. EEG 7/14:This was an abnormal EEG due to the presence of moderate background slowing. This slowing is non-specific and is indicative of encephalopathy. MRI 7/18:1. Normal expected interval evolution of previously identified infarct involving the left basal ganglia/corona radiata. Additionally, previously seen signal abnormalities within the bilateral basal ganglia have also largely resolved relative to previous MRI from 03/24/2016. Similarly, changes at the right parietal cortex have also evolved. No new areas of ischemia orinjury identified.  Persistent layering debris/hemorrhage within the lateral ventricles, likely related to recent history of meningitis. No Hydrocephalus.  Continuous EEG 7/20: Mild diffuse encephalopathy. No electrographic seizures. I don't feel that the patient has had any significant improvement in the last 3 days that I have taken care of the patient Remains at high risk for aspiration  Sepsis - Possible HCAP-->completed abx course  h/O strep pneumo meningitis (march 2017). Received repeat doses empiric vanco and zosyn 7/22 due to hypothermia, cx's performed D/Ced abx.   Acute Hypoxic Respiratory Failure Unable to Protect Airway - In setting of seizures.Intubated from 7/13-7/21 HCAP , chest x-ray on 7/22 showed left lower lobe collapse H/O Tracheostomy March 2017 - S/P Decannulation. Mobilize-will need rehabilitation Pulm hygiene   Shock - Resolved. Possibly secondary to sepsis.   Hypokalemia - resolved  Hyperchloremia - Mild.  Urinary retention - Foley replaced.,  Trending UOP with Foley. Bethanechol started 7/18.Continue Flomax  Dysphagia  Dysphagia 1 (puree);Honey-thick liquid based on speech therapy evaluation   Anemia - Chronic. No signs of active bleeding.  Stable,  Check cbc in am    Code Status : FULL CODE  Family Communication  : wife today  [facility] For Discharge :   Consults  :  Cardiology, neurology  Procedures  :   DVT Prophylaxis  :  Lovenox -  SCDs  Lab Results  Component Value Date   PLT 319 07/02/2016    Antibiotics  :  Anti-infectives    Start     Dose/Rate Route Frequency Ordered Stop   06/25/16 2000  vancomycin (VANCOCIN) IVPB 750 mg/150 ml premix  Status:  Discontinued     750 mg 150 mL/hr over 60 Minutes Intravenous Every 12 hours 06/25/16 1900 06/25/16 1901   06/25/16 2000  piperacillin-tazobactam (ZOSYN) IVPB 3.375 g  Status:  Discontinued     3.375 g 12.5 mL/hr over 240 Minutes Intravenous Every 8 hours 06/25/16 1900 06/26/16 0840   06/25/16 2000  vancomycin (VANCOCIN) IVPB 1000 mg/200 mL premix  Status:  Discontinued     1,000 mg 200 mL/hr over 60 Minutes Intravenous Every 12 hours 06/25/16 1901 06/26/16 0840   06/18/16 2000  vancomycin (VANCOCIN) IVPB 1000 mg/200 mL premix  Status:  Discontinued     1,000 mg 200 mL/hr over 60 Minutes Intravenous Every 12 hours 06/18/16 1935 06/20/16 1458   06/17/16 0800  vancomycin (VANCOCIN) IVPB 750 mg/150 ml premix  Status:  Discontinued     750 mg 150 mL/hr over 60 Minutes Intravenous Every 12 hours 06/16/16 1914 06/18/16 0952   06/17/16 0200  piperacillin-tazobactam (ZOSYN) IVPB 3.375 g  Status:  Discontinued     3.375 g 12.5 mL/hr over 240 Minutes Intravenous Every 8 hours 06/16/16 1940 06/23/16 0832   06/16/16 1945  piperacillin-tazobactam (ZOSYN) IVPB 3.375 g  Status:  Discontinued     3.375 g 12.5 mL/hr over 240 Minutes Intravenous Every 8 hours 06/16/16 1914 06/16/16 1940   06/16/16 1730  vancomycin (VANCOCIN) IVPB 1000 mg/200 mL premix     1,000 mg 200 mL/hr over 60 Minutes Intravenous STAT 06/16/16 1727 06/16/16 2030   06/16/16 1730  piperacillin-tazobactam (ZOSYN) IVPB 3.375 g     3.375 g 100 mL/hr over 30 Minutes Intravenous STAT 06/16/16 1727 06/16/16 1939        Objective:   Vitals:   07/03/16 1000 07/03/16 1100 07/03/16 1200  07/03/16 1300  BP: (!) 161/82 (!) 141/73 94/69 (!) 88/63  Pulse:  (!) 40    Resp:   18   Temp:   97.2 F (36.2 C)   TempSrc:   Axillary   SpO2:  97% 96%   Weight:      Height:        Wt Readings from Last 3 Encounters:  07/03/16 67.1 kg (147 lb 14.4 oz)  05/30/16 55.4 kg (122 lb 2.2 oz)  05/09/16 55.8 kg (123 lb)     Intake/Output Summary (Last 24 hours) at 07/03/16 1316 Last data filed at 07/03/16 1300  Gross per 24 hour  Intake             2885 ml  Output             2950 ml  Net              -65 ml     Physical Exam  Awake Alert, Oriented X ? No new F.N deficits, Normal affect [place].AT,PERRAL Supple Neck,No JVD, No cervical lymphadenopathy appriciated.  Symmetrical Chest wall movement, Good air movement bilaterally, CTAB RRR,No Gallops,Rubs or new Murmurs, No Parasternal Heave +ve B.Sounds, Abd Soft, No tenderness, No organomegaly appriciated, No rebound - guarding or rigidity. No Cyanosis, Clubbing or edema, No new Rash or bruise  Pt able to move all 4 ext, pt able to sit up in bed    Data Review:    CBC  Recent Labs Lab 06/27/16 0533 06/28/16 0548 06/29/16 0418 06/30/16 0638 07/02/16 0319  WBC 7.8 9.8  11.8* 6.7 8.2  HGB 7.2* 8.6* 8.1* 8.0* 8.8*  HCT 22.1* 27.7* 25.4* 24.3* 27.7*  PLT 222 283 277 277 319  MCV 88.0 89.9 90.1 87.4 89.9  MCH 28.7 27.9 28.7 28.8 28.6  MCHC 32.6 31.0 31.9 32.9 31.8  RDW 16.1* 16.4* 16.7* 16.6* 17.2*    Chemistries   Recent Labs Lab 06/28/16 0548 06/29/16 0418 06/30/16 0638 07/01/16 0224 07/02/16 0319  NA 147* 147* 145 146* 144  K 3.5 3.9 3.3* 4.4 4.6  CL 107 106 105 110 114*  CO2 32 33* 32 26 24  GLUCOSE 77 78 74 91 102*  BUN _0 CREATININE 0.91 0.81 0.82 0.98 0.89  CALCIUM 8.7* 8.9 8.3* 8.2* 8.1*  MG 2.2 1.9  --   --   --   AST  --  14* _1 ALT  --  _2 16*  ALKPHOS  --  51 46 53 49  BILITOT  --  0.3 0.4 0.5 0.5    ------------------------------------------------------------------------------------------------------------------ No results for input(s): CHOL, HDL, LDLCALC, TRIG, CHOLHDL, LDLDIRECT in the last 72 hours.  Lab Results  Component Value Date   HGBA1C 5.3 05/20/2016   ------------------------------------------------------------------------------------------------------------------ No results for input(s): TSH, T4TOTAL, T3FREE, THYROIDAB in the last 72 hours.  Invalid input(s): FREET3 ------------------------------------------------------------------------------------------------------------------ No results for input(s): VITAMINB12, FOLATE, FERRITIN, TIBC, IRON, RETICCTPCT in the last 72 hours.  Coagulation profile No results for input(s): INR, PROTIME in the last 168 hours.  No results for input(s): DDIMER in the last 72 hours.  Cardiac Enzymes No results for input(s): CKMB, TROPONINI, MYOGLOBIN in the last 168 hours.  Invalid input(s): CK ------------------------------------------------------------------------------------------------------------------    Component Value Date/Time   BNP 23.8 03/20/2016 1800    Inpatient Medications  Scheduled Meds: . aspirin  325 mg Per Tube Daily  . atorvastatin  20 mg Oral q1800  . bethanechol  10 mg Oral Q8H  . chlorhexidine gluconate (SAGE KIT)  15 mL Mouth Rinse BID  . divalproex  750 mg Oral M0L  . folic acid  1 mg Oral Daily  . hydrocortisone sod succinate (SOLU-CORTEF) inj  50 mg Intravenous Q8H  . insulin aspart  0-15 Units Subcutaneous TID WC  . insulin aspart  0-5 Units Subcutaneous QHS  . levETIRAcetam  1,500 mg Oral BID  . thiamine  100 mg Oral Daily   Continuous Infusions: . dextrose 5 % and 0.45 % NaCl with KCl 20 mEq/L 100 mL/hr at 07/03/16 1300   PRN Meds:.sodium chloride, RESOURCE THICKENUP CLEAR, sodium chloride  Micro Results Recent Results (from the past 240 hour(s))  Culture, blood (Routine X 2) w Reflex to  ID Panel     Status: None   Collection Time: 06/25/16  7:20 PM  Result Value Ref Range Status   Specimen Description LEFT ANTECUBITAL  Final   Special Requests IN PEDIATRIC BOTTLE 3CC  Final   Culture NO GROWTH 5 DAYS  Final   Report Status 06/30/2016 FINAL  Final  Culture, blood (Routine X 2) w Reflex to ID Panel     Status: None   Collection Time: 06/25/16  7:27 PM  Result Value Ref Range Status   Specimen Description BLOOD LEFT HAND  Final   Special Requests IN PEDIATRIC BOTTLE 2CC  Final   Culture NO GROWTH 5 DAYS  Final   Report Status 06/30/2016 FINAL  Final  Culture, blood (routine x 2)     Status: None (Preliminary result)   Collection  Time: 06/30/16 10:00 AM  Result Value Ref Range Status   Specimen Description BLOOD RIGHT ARM  Final   Special Requests BOTTLES DRAWN AEROBIC AND ANAEROBIC 10CC  Final   Culture NO GROWTH 3 DAYS  Final   Report Status PENDING  Incomplete  Culture, blood (routine x 2)     Status: None (Preliminary result)   Collection Time: 06/30/16 10:10 AM  Result Value Ref Range Status   Specimen Description BLOOD RIGHT HAND  Final   Special Requests   Final    BOTTLES DRAWN AEROBIC AND ANAEROBIC 10CC AER [place] ANA   Culture NO GROWTH 3 DAYS  Final   Report Status PENDING  Incomplete    Radiology Reports Dg Abd 1 View  Result Date: 06/16/2016 CLINICAL DATA:  Orogastric tube placement. EXAM: ABDOMEN - 1 VIEW COMPARISON:  05/25/2016 FINDINGS: An orogastric tube is now seen with tip overlying the distal stomach in the expected region of the pylorus. A left femoral central venous catheter is seen in place as well as a temperature probe within the bladder. The bowel gas pattern is normal. IMPRESSION: Orogastric tube tip overlies the distal stomach, in expected region of pylorus. Electronically Signed   By: Earle Gell M.D.   On: 06/16/2016 20:50   Ct Head Wo Contrast  Result Date: 06/29/2016 CLINICAL DATA:  Altered mental status EXAM: CT HEAD WITHOUT CONTRAST  TECHNIQUE: Contiguous axial images were obtained from the base of the skull through the vertex without intravenous contrast. COMPARISON:  MRI brain dated 06/21/2016 FINDINGS: No evidence of parenchymal hemorrhage or extra-axial fluid collection. No mass lesion, mass effect, or midline shift. No CT evidence of acute infarction. Lacunar infarcts in the left corona radiata and basal ganglia. Subcortical white matter and periventricular small vessel ischemic changes. Cerebral volume is within normal limits.  No ventriculomegaly. New complete opacification of the bilateral maxillary sinuses. Partial opacification of the bilateral ethmoid and sphenoid sinuses. Partial opacification of the right mastoid air cells. No evidence of calvarial fracture. IMPRESSION: No evidence of acute intracranial abnormality. Lacunar infarcts in the left basal ganglia and corona radiata. Electronically Signed   By: Julian Hy M.D.   On: 06/29/2016 16:31  Ct Head Wo Contrast  Result Date: 06/17/2016 CLINICAL DATA:  Unresponsive EXAM: CT HEAD WITHOUT CONTRAST TECHNIQUE: Contiguous axial images were obtained from the base of the skull through the vertex without intravenous contrast. COMPARISON:  05/19/2016 FINDINGS: The bony calvarium is intact. Increased mucosal thickening is noted within the ethmoid and sphenoid sinus. Air-fluid levels are noted within the maxillary antra bilaterally consistent with a more acute degree of sinusitis. Lacunar infarct is noted within the left basal ganglia and left thalamus extending into the deep white matter new from the prior exam. No findings to suggest acute hemorrhage are noted. IMPRESSION: New areas of subacute to chronic ischemia on the left. Progression in the degree of sinusitis as described. Electronically Signed   By: Inez Catalina M.D.   On: 06/17/2016 13:06   Mr Jeri Cos UG Contrast  Result Date: 06/21/2016 CLINICAL DATA:  Initial evaluation for recurrent altered mental status,  seizures, history of strep pneumo meningitis. Question new areas of and within the left basal ganglia. EXAM: MRI HEAD WITHOUT AND WITH CONTRAST TECHNIQUE: Multiplanar, multiecho pulse sequences of the brain and surrounding structures were obtained without and with intravenous contrast. CONTRAST:  13m MULTIHANCE GADOBENATE DIMEGLUMINE 529 MG/ML IV SOLN COMPARISON:  Comparison made with prior CT from 06/17/2016 as well as previous MRI  from 03/24/2016. FINDINGS: Diffuse prominence of the CSF containing spaces compatible with generalized cerebral atrophy. There has been normal interval expected evolution of previously identified acute ischemic infarct involving the left corona radiata/basal ganglia, which now demonstrates progressive encephalomalacia without significant diffusion abnormality. Question minimal faint enhancement within this region extending towards the left cerebral peduncle, likely related to the late subacute ischemia (series 11, image 28). Additional remote infarctions involving the bilateral basal ganglia again seen. Chronic blood products again noted within the left external capsule. No new areas of restricted diffusion to suggest new ischemia or injury. Similar FLAIR signal abnormality at the right parietal cortex. Associated subtle laminar necrosis within this region (series 3, image 6). No significant enhancement now seen within this region. Additional focus of cortical flares abnormality within the right frontal lobe slightly more prominent as compared to previous exam, although this appears to be chronic in nature. No appreciable enhancement within these areas. No other abnormal enhancement elsewhere within the brain. Major intracranial vascular flow voids are preserved. No mass lesion, mass effect, or midline shift. Layering susceptibility artifact within the stent was of both lateral ventricles, likely debris/chronic hemorrhage related to prior meningitis, similar to prior. No hydrocephalus.  Craniocervical junction within normal limits. Visualized upper cervical spine unremarkable. Pituitary gland normal. No acute abnormality about the globes and orbits. Extensive opacity throughout the paranasal sinuses with fluid levels within the sphenoid and maxillary sinuses. Bilateral mastoid effusions. Fluid within the nasopharynx. Patient likely intubated. Bone marrow signal intensity within normal limits. No scalp soft tissue abnormality. IMPRESSION: 1. Normal expected interval evolution of previously identified infarct involving the left basal ganglia/corona radiata. Additionally, previously seen signal abnormalities within the bilateral basal ganglia have also largely resolved relative to previous MRI from 03/24/2016. Similarly, changes at the right parietal cortex have also evolved. No new areas of ischemia or injury identified. 2. Persistent layering debris/hemorrhage within the lateral ventricles, likely related to recent history of meningitis. No hydrocephalus. Electronically Signed   By: Jeannine Boga M.D.   On: 06/21/2016 04:55   Dg Chest Port 1 View  Result Date: 06/30/2016 CLINICAL DATA:  Altered mental status.  Low body temperature. EXAM: PORTABLE CHEST 1 VIEW COMPARISON:  06/25/2016 FINDINGS: Lungs are adequately inflated as patient is slightly rotated to the right. Interval resolution of left base opacification with improvement but mild residual hazy opacification in the right base possibly due to small layering effusions/atelectasis, although cannot exclude infection cardiomediastinal silhouette and remainder of the exam is unchanged. IMPRESSION: Interval improvement with mild residual hazy opacification in the right base likely small layering effusions/atelectasis although cannot exclude infection. Electronically Signed   By: Marin Olp M.D.   On: 06/30/2016 08:56  Dg Chest Port 1 View  Result Date: 06/25/2016 CLINICAL DATA:  Pneumonia. EXAM: PORTABLE CHEST 1 VIEW COMPARISON:   06/22/2016. FINDINGS: Interval extubation. Left IJ central line tip projects over the SVC. Heart size normal. Left lower lobe collapse/ consolidation. Hazy opacification over the lower hemithoraces bilaterally, new on the right. IMPRESSION: 1. Left lower lobe collapse/consolidation. 2. Hazy opacification over both lower hemithoraces may be due to layering pleural fluid and/or true airspace opacification. Electronically Signed   By: Lorin Picket M.D.   On: 06/25/2016 10:13   Dg Chest Port 1 View  Result Date: 06/22/2016 CLINICAL DATA:  Respiratory failure, acute encephalopathy, cardiogenic shock, acute and chronic CHF, current smoker. EXAM: PORTABLE CHEST 1 VIEW COMPARISON:  Portable chest x-ray of June 19, 2016 FINDINGS: The right lung is adequately  inflated and clear. On the left there is persistent basilar atelectasis or pneumonia with small pleural effusion. The heart is normal in size. The pulmonary vascularity is not engorged. The endotracheal tube tip lies 5.3 cm above the carina. The esophagogastric tube tip projects below the inferior margin of the image. The left internal jugular venous catheter tip projects over the proximal third of the SVC. IMPRESSION: Persistent left lower lobe atelectasis or pneumonia with trace pleural effusion. No CHF. The support tubes are in stable position. Electronically Signed   By: David  Martinique M.D.   On: 06/22/2016 07:20   Dg Chest Portable 1 View  Result Date: 06/19/2016 CLINICAL DATA:  Respiratory failure EXAM: PORTABLE CHEST 1 VIEW COMPARISON:  06/18/2016 FINDINGS: ET tube tip is above the carina. There is a left IJ catheter with tip in the projection of the SVC. Enteric tube tip is below the hemidiaphragm. Persistent opacity within the left lung base. Right lung is clear. IMPRESSION: 1. Stable position of the support apparatus. 2. No change in left base opacity. Electronically Signed   By: Kerby Moors M.D.   On: 06/19/2016 07:44   Dg Chest Port 1  View  Result Date: 06/18/2016 CLINICAL DATA:  50 year old male with a history of respiratory failure EXAM: PORTABLE CHEST 1 VIEW COMPARISON:  06/17/2016, 06/16/2016 FINDINGS: Cardiomediastinal silhouette unchanged in size and contour. Endotracheal tube unchanged, terminating 3.8 cm above the carina. Unchanged left IJ approach central venous catheter, appearing to terminate superior vena cava. Gastric tube projects over the mediastinum, terminating out of the field of view. No visualized pneumothorax. Improved aeration in the left mid lung, with persistent opacity in the retrocardiac region. Blunting of left costophrenic angle. IMPRESSION: Improved aeration of the left lung, with persistent left basilar opacity, potentially consolidation, edema, and/ or atelectasis. Small left pleural effusion not excluded. Unchanged position of endotracheal tube, gastric tube, left IJ central venous catheter. Signed, Dulcy Fanny. Earleen Newport, DO [facility] Electronically Signed   By: Corrie Mckusick D.O.   On: 06/18/2016 10:15   Dg Chest Port 1 View  Result Date: 06/17/2016 CLINICAL DATA:  50 year old male with enteric tube placement. EXAM: PORTABLE CHEST 1 VIEW COMPARISON:  A abdominal radiograph dated 06/16/2016 FINDINGS: An endotracheal tube is noted with tip approximately 3 cm above the carina. Single portable view of the chest demonstrates patchy area of hazy airspace opacity in the left mid to lower lung field. There is no pleural effusion or pneumothorax. The cardiac silhouette is within normal limits. Left IJ central line with tip over central SVC. An enteric tube extends into the epigastric area with tip superimposed over the right L3 pedicle likely in the distal stomach. There is no bowel dilatation or evidence of obstruction. No free air. No radiopaque calculi or foreign object. The soft tissues and osseous structures appear unremarkable. The previously seen left  femoral central venous catheter has been removed. A temperature probe is partially visualized over the bladder. IMPRESSION: Endotracheal tube above the carina. Patchy area of airspace density in the left mid to lower lung field. Follow-up recommended. Enteric tube in the distal stomach.  No bowel obstruction. Electronically Signed   By: Anner Crete M.D.   On: 06/17/2016 02:48   Dg Chest Port 1 View  Result Date: 06/16/2016 CLINICAL DATA:  Endotracheal tube placement EXAM: PORTABLE CHEST 1 VIEW COMPARISON:  06/16/2016 FINDINGS: There is an endotracheal tube with the tip 3.7 cm above the carina. Nasogastric tube coursing below the  diaphragm. There is hazy left lower lobe airspace disease. There is no pleural effusion or pneumothorax. The heart and mediastinal contours are unremarkable. The osseous structures are unremarkable. IMPRESSION: 1. Hazy left lower lobe airspace disease concerning for pneumonia. 2. Endotracheal tube with the tip 3.7 cm above the carina. Electronically Signed   By: Kathreen Devoid   On: 06/16/2016 22:41   Dg Chest Port 1 View  Result Date: 06/16/2016 CLINICAL DATA:  Intubation.  Followup pneumonia. EXAM: PORTABLE CHEST 1 VIEW 8:24 p.m.: COMPARISON:  Portable chest x-ray earlier same date 5:49 p.m. and previously. FINDINGS: Endotracheal tube tip in satisfactory position projecting approximately 6 cm above the carina. Nasogastric tube courses below the diaphragm into the stomach though its tip is not included on the image. Consolidation in the left lower lobe as noted earlier. Developing patchy opacities at the right lung base. IMPRESSION: 1. Support apparatus satisfactory. Endotracheal tube tip approximately 6 cm above the carina. Nasogastric tube courses below the diaphragm into the stomach. 2. Left lower lobe pneumonia as noted earlier. Developing atelectasis and/or pneumonia involving the right lung base. Electronically Signed   By: Evangeline Dakin M.D.   On: 06/16/2016 20:49    Dg Chest Port 1 View  Result Date: 06/16/2016 CLINICAL DATA:  50 year old with possible aspiration and sepsis. EXAM: PORTABLE CHEST 1 VIEW COMPARISON:  05/29/2016, 05/27/2016 and earlier. FINDINGS: Patient is rotated to the left. Cardiomediastinal silhouette unremarkable, unchanged. Airspace consolidation in the retrocardiac left lung base. Lungs otherwise clear. Pulmonary vascularity normal. No visible pleural effusions. IMPRESSION: Left lower lobe pneumonia. Electronically Signed   By: Evangeline Dakin M.D.   On: 06/16/2016 18:22   Dg Abd Portable 1v  Result Date: 06/17/2016 CLINICAL DATA:  50 year old male with enteric tube placement. EXAM: PORTABLE CHEST 1 VIEW COMPARISON:  A abdominal radiograph dated 06/16/2016 FINDINGS: An endotracheal tube is noted with tip approximately 3 cm above the carina. Single portable view of the chest demonstrates patchy area of hazy airspace opacity in the left mid to lower lung field. There is no pleural effusion or pneumothorax. The cardiac silhouette is within normal limits. Left IJ central line with tip over central SVC. An enteric tube extends into the epigastric area with tip superimposed over the right L3 pedicle likely in the distal stomach. There is no bowel dilatation or evidence of obstruction. No free air. No radiopaque calculi or foreign object. The soft tissues and osseous structures appear unremarkable. The previously seen left femoral central venous catheter has been removed. A temperature probe is partially visualized over the bladder. IMPRESSION: Endotracheal tube above the carina. Patchy area of airspace density in the left mid to lower lung field. Follow-up recommended. Enteric tube in the distal stomach.  No bowel obstruction. Electronically Signed   By: Anner Crete M.D.   On: 06/17/2016 02:48   Dg Swallowing Func-speech Pathology  Result Date: 06/29/2016 Objective Swallowing Evaluation: Type of Study: MBS-Modified Barium Swallow Study  Patient Details Name: HILMER ALIBERTI MRN: [id number] Date of Birth: Jun 07, 1966 Today's Date: 06/29/2016 Time: SLP Start Time (ACUTE ONLY): 1215-SLP Stop Time (ACUTE ONLY): 1235 SLP Time Calculation (min) (ACUTE ONLY): 20 min Past Medical History: Past Medical History: Diagnosis Date . Acute encephalopathy  . Acute ischemic stroke ([place])  . Acute renal failure ([place])  . Acute respiratory failure with hypoxia ([place])  . AKI (acute kidney injury) ([place]) 02/16/2016 . Altered mental status  . Cardiac arrest ([place])  . Cardiogenic shock ([place])  . Cerebral septic emboli ([place])  .  Cerebral thrombosis with cerebral infarction 03/25/2016 . CHF (congestive heart failure) ([place])  . Convulsions/seizures ([place]) 05/04/2016  Seizure disorder - Continue Keppra, Depakote.    . Dementia 04/27/2016 . Depression  . Dysphagia 05/04/2016  Dysphagia - Dysphagia 1 diet per SLP    . ETOH abuse  . Hydrocephalus  . Hyperlipidemia 05/04/2016  Dyslipidemia - Continue statin.    Marland Kitchen Hypernatremia  . Hypothermia 03/20/2016 . Low serum cortisol level (HCC) 05/04/2016  Low a.m. cortisol  - Was 1.2 checked on 04/03/2016  - This likely is reflective of cortisol suppression from steroid use - Tapered steroids off  . Meningitis, pneumococcal, recurrent  . Meningitis, streptococcal 02/16/2016 . Meningoencephalitis  . NSTEMI (non-ST elevated myocardial infarction) ([place]) 03/14/2016 . Protein-calorie malnutrition, severe ([place])  . Respiratory failure ([place]) 03/03/2016 . Septic shock ([place])  . Severe sepsis ([place])  . 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 Substance abuse  . Systolic and diastolic CHF, chronic ([place])  . Tobacco abuse 02/16/2016 . Trichomonal urethritis in male 02/16/2016 . Urinary retention 05/04/2016  Urinary retention - Continue Flomax    Past Surgical History: Past Surgical History: Procedure Laterality Date . ANKLE SURGERY   . CARDIAC CATHETERIZATION N/A 02/17/2016  Procedure: IABP Insertion;  Surgeon: Wellington Hampshire, MD;  Location: [facility];  Service:  Cardiovascular;  Laterality: N/A; . TEE WITHOUT CARDIOVERSION N/A 03/29/2016  Procedure: TRANSESOPHAGEAL ECHOCARDIOGRAM (TEE);  Surgeon: Larey Dresser, MD;  Location: [facility] ENDOSCOPY;  Service: Cardiovascular;  Laterality: N/A; HPI: 50 year old male with h/o strep pneumonia meningitis, dysphagia, prior trach (3/17),dementia and hospital course complicated by cardiac arrest and cardiogenic shock in March 2016, ARF, shock liver, adrenal insufficiency presented with acute encephalopathy June admission.  Re-admitted 7/13 again presenting with AMS thought to be septic/PNA in origin but had witnessed seizure in ED.  SLP evaluations during previous admissions have recommended Dys 1 diet and nectar thick liquids with advancement to Dys 2 textures prior to discharge.  Intubated 7/13-21.  Subjective: Pt kept eyes closed during MBS. Appeared lethargic, but accepted po presentations Assessment / Plan / Recommendation CHL IP CLINICAL IMPRESSIONS 06/29/2016 Therapy Diagnosis Mild oral phase dysphagia;Moderate pharyngeal phase dysphagia Clinical Impression Pt presents with mild oral, moderate pharyngeal phase dysphagia, characterized by poor bolus formation and posterior propulsion, resulting in posterior spillage to the vallecula on nectar, honey, and puree consistencies, and at the pyriform sinus on thin liquids. Silent aspiration was noted during the swallow of thin liquids. No penetration  or aspiration was observed on nectar, honey, or puree consistencies, however, risk of episodic aspiration of all consistencies is possible, given poor oral prep and delayed swallow reflex. Solid consistencies were not provided at this time, due to extended ineffective oral bolus formation and propulsion and associated risk of aspiration with fatigue.  Recommend Puree diet and nectar thick liquids with full supervision. Pt needs to be monitored closely during meals to insure oral cavity is clear before provided the next bolus. Safe swallow  precautions were sent back to room with pt. Impact on safety and function Moderate aspiration risk   CHL IP TREATMENT RECOMMENDATION 06/29/2016 Treatment Recommendations Therapy as outlined in treatment plan below   Prognosis 06/29/2016 Prognosis for Safe Diet Advancement Fair Barriers to Reach Goals Cognitive deficits CHL IP DIET RECOMMENDATION 06/29/2016 SLP Diet Recommendations Dysphagia 1 (Puree) solids;Nectar thick liquid Liquid Administration via Straw;Cup Medication Administration Crushed with puree Compensations Slow rate;Small sips/bites;Minimize environmental distractions Postural Changes Remain semi-upright after after feeds/meals (Comment);Seated upright at 90 degrees  CHL IP OTHER RECOMMENDATIONS 06/29/2016 Recommended Consults -- Oral Care Recommendations Oral care BID Other Recommendations Order thickener from pharmacy;Prohibited food (jello, ice cream, thin soups);Remove water pitcher   CHL IP FOLLOW UP RECOMMENDATIONS 06/29/2016 Follow up Recommendations Skilled Nursing facility   [facility] IP FREQUENCY AND DURATION 06/29/2016 Speech Therapy Frequency (ACUTE ONLY) min 2x/week Treatment Duration 2 weeks    CHL IP ORAL PHASE 06/29/2016 Oral Phase Impaired Oral - Honey Teaspoon Premature spillage;Reduced posterior propulsion Oral - Nectar Teaspoon Premature spillage;Reduced posterior propulsion;Delayed oral transit Oral - Nectar Straw Premature spillage;Reduced posterior propulsion;Delayed oral transit Oral - Thin Straw Premature spillage;Reduced posterior propulsion;Delayed oral transit Oral - Puree Premature spillage;Delayed oral transit;Reduced posterior propulsion  CHL IP CERVICAL ESOPHAGEAL PHASE 06/29/2016 Cervical Esophageal Phase [facility] Shonna Chock 06/29/2016, 1:27 PM Celia B. Bueche, [facility], CCC-SLP [phone]               Time Spent in minutes  30   Jani Gravel M.D on 07/03/2016 at 1:16 PM  Between 7am to 7pm - Pager - [phone]  After 7pm go to [email or web address] - password [facility]  [facility] -  Office  [phone]

## 2016-07-04 ENCOUNTER — Other Ambulatory Visit (HOSPITAL_COMMUNITY): Payer: Medicaid Other

## 2016-07-04 DIAGNOSIS — R4182 Altered mental status, unspecified: Secondary | ICD-10-CM

## 2016-07-04 DIAGNOSIS — R001 Bradycardia, unspecified: Secondary | ICD-10-CM

## 2016-07-04 LAB — COMPREHENSIVE METABOLIC PANEL
ALT: 12 U/L — AB (ref 17–63)
AST: 12 U/L — AB (ref 15–41)
Albumin: 2 g/dL — ABNORMAL LOW (ref 3.5–5.0)
Alkaline Phosphatase: 44 U/L (ref 38–126)
Anion gap: 5 (ref 5–15)
BILIRUBIN TOTAL: 0.3 mg/dL (ref 0.3–1.2)
BUN: 11 mg/dL (ref 6–20)
CO2: 28 mmol/L (ref 22–32)
CREATININE: 0.79 mg/dL (ref 0.61–1.24)
Calcium: 8.1 mg/dL — ABNORMAL LOW (ref 8.9–10.3)
Chloride: 114 mmol/L — ABNORMAL HIGH (ref 101–111)
GFR calc Af Amer: >60 mL/min (ref >60)
Glucose, Bld: 91 mg/dL (ref 65–99)
Potassium: 3.6 mmol/L (ref 3.5–5.1)
Sodium: 147 mmol/L — ABNORMAL HIGH (ref 135–145)
TOTAL PROTEIN: 4.4 g/dL — AB (ref 6.5–8.1)

## 2016-07-04 LAB — GLUCOSE, CAPILLARY
GLUCOSE-CAPILLARY: 63 mg/dL — AB (ref 65–99)
GLUCOSE-CAPILLARY: 69 mg/dL (ref 65–99)
GLUCOSE-CAPILLARY: 72 mg/dL (ref 65–99)
GLUCOSE-CAPILLARY: 39 mg/dL — AB (ref 65–99)
Glucose-Capillary: 68 mg/dL (ref 65–99)
Glucose-Capillary: 101 mg/dL — ABNORMAL HIGH (ref 65–99)
Glucose-Capillary: 70 mg/dL (ref 65–99)
Glucose-Capillary: 69 mg/dL (ref 65–99)
Glucose-Capillary: 123 mg/dL — ABNORMAL HIGH (ref 65–99)

## 2016-07-04 LAB — CBC
HEMATOCRIT: 25.9 % — AB (ref 39.0–52.0)
Hemoglobin: 8.1 g/dL — ABNORMAL LOW (ref 13.0–17.0)
MCH: 28.1 pg (ref 26.0–34.0)
MCHC: 31.3 g/dL (ref 30.0–36.0)
MCV: 89.9 fL (ref 78.0–100.0)
Platelets: 283 10*3/uL (ref 150–400)
RBC: 2.88 MIL/uL — AB (ref 4.22–5.81)
RDW: 17.6 % — AB (ref 11.5–15.5)
WBC: 10.1 10*3/uL (ref 4.0–10.5)

## 2016-07-04 LAB — VALPROIC ACID LEVEL: Valproic Acid Lvl: 88 ug/mL (ref 50.0–100.0)

## 2016-07-04 MED ORDER — DEXTROSE 50 % IV SOLN
INTRAVENOUS | Status: AC
  Filled 2016-07-04: qty 50

## 2016-07-04 MED ORDER — DEXTROSE-NACL 5-0.9 % IV SOLN
INTRAVENOUS | Status: DC
  Administered 2016-07-04: 15:00:00 via INTRAVENOUS

## 2016-07-04 MED ORDER — DEXTROSE 10 % IV SOLN
INTRAVENOUS | Status: DC
  Administered 2016-07-04: 19:00:00 via INTRAVENOUS

## 2016-07-04 MED ORDER — DEXTROSE 50 % IV SOLN
50.0000 mL | Freq: Once | INTRAVENOUS | Status: AC
  Administered 2016-07-04: 50 mL via INTRAVENOUS

## 2016-07-04 MED ORDER — DEXTROSE 50 % IV SOLN
INTRAVENOUS | Status: AC
  Administered 2016-07-04: 50 mL
  Filled 2016-07-04: qty 50

## 2016-07-04 MED ORDER — HYDROCORTISONE 20 MG PO TABS
20.0000 mg | ORAL_TABLET | Freq: Three times a day (TID) | ORAL | Status: DC
  Administered 2016-07-04 (×2): 20 mg via ORAL
  Filled 2016-07-04 (×4): qty 1

## 2016-07-04 NOTE — Progress Notes (Addendum)
Daily Progress Note   Patient Name: Dale [name]       Date: 07/04/2016 DOB: January 13, 1966  Age: 50 y.o. MRN#: [id number] Attending Physician: Reyne Dumas, MD Primary Care Physician: No primary care provider on file. Admit Date: 06/16/2016  Reason for Consultation/Follow-up: Establishing goals of care  Subjective:  not awake not alert See below No family at bedside.   Length of Stay: 18  Current Medications: Scheduled Meds:  . aspirin  325 mg Per Tube Daily  . atorvastatin  20 mg Oral q1800  . bethanechol  10 mg Oral Q8H  . chlorhexidine gluconate (SAGE KIT)  15 mL Mouth Rinse BID  . dextrose      . divalproex  750 mg Oral Q8H  . enoxaparin (LOVENOX) injection  40 mg Subcutaneous Q24H  . folic acid  1 mg Oral Daily  . hydrocortisone  10 mg Oral TID  . insulin aspart  0-15 Units Subcutaneous TID WC  . insulin aspart  0-5 Units Subcutaneous QHS  . levETIRAcetam  1,500 mg Oral BID  . thiamine  100 mg Oral Daily    Continuous Infusions:    PRN Meds: sodium chloride, LORazepam, RESOURCE THICKENUP CLEAR, sodium chloride  Physical Exam         Not alert not awake Resting in bed Shallow breathing S1 S2 Abdomen soft Trace edema Does not arouse to his name being called, does not answer questions.   Vital Signs: BP 91/62 (BP Location: Right Arm)   Pulse (!) 44   Temp 97.4 F (36.3 C) (Axillary)   Resp (!) 9   Ht '5\' 8"'$  (1.727 m)   Wt 66.7 kg (147 lb)   SpO2 100%   BMI 22.35 kg/m  SpO2: SpO2: 100 % O2 Device: O2 Device: Not Delivered O2 Flow Rate: O2 Flow Rate (L/min): 2 L/min  Intake/output summary:  Intake/Output Summary (Last 24 hours) at 07/04/16 0917 Last data filed at 07/04/16 0800  Gross per 24 hour  Intake           1812.5 ml  Output             4352 ml    Net          -2539.5 ml   LBM: Last BM Date: 07/04/16 Baseline Weight: Weight: 55 kg (121 lb 4.1  oz) Most recent weight: Weight: 66.7 kg (147 lb)       Palliative Assessment/Data:    Flowsheet Rows   Flowsheet Row Most Recent Value  Intake Tab  Referral Department  Hospitalist  Unit at Time of Referral  Intermediate Care Unit  Palliative Care Primary Diagnosis  Other (Comment)  Palliative Care Type  Return patient Palliative Care  Reason for referral  Clarify Goals of Care  Date first seen by Palliative Care  06/30/16  Clinical Assessment  Palliative Performance Scale Score  20%  Pain Max last 24 hours  5  Pain Min Last 24 hours  4  Dyspnea Max Last 24 Hours  5  Dyspnea Min Last 24 hours  4  Nausea Max Last 24 Hours  4  Nausea Min Last 24 Hours  3  Psychosocial & Spiritual Assessment  Palliative Care Outcomes  Patient/Family meeting held?  No  Who was at the meeting?  call placed, unable to reach wife.   Palliative Care Outcomes  Clarified goals of care  Palliative Care follow-up planned  Yes, Facility      Patient Active Problem List   Diagnosis Date Noted  . Hypoglycemia 07/02/2016  . Pressure ulcer 06/26/2016  . Acute respiratory failure with hypercapnia (HCC)   . Encounter for central line placement   . Acute respiratory failure with hypoxemia (HCC)   . Hypothermia 05/19/2016  . Seizure (HCC)   . Weak 05/06/2016  . Convulsions/seizures (HCC) 05/04/2016  . Urinary retention 05/04/2016  . Hyperlipidemia 05/04/2016  . Dysphagia 05/04/2016  . Low serum cortisol level (HCC) 05/04/2016  . Dementia 04/27/2016  . 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 Moderate protein-calorie malnutrition (HCC)   . Palliative care encounter   . Cerebral septic emboli (HCC)   . Sepsis (HCC)   . Cerebral thrombosis with cerebral infarction 03/25/2016  . Meningoencephalitis   . Bacterial meningitis   . Acute ischemic stroke (HCC)   . Severe sepsis (HCC)   . Meningitis,  pneumococcal, recurrent   . Systolic and diastolic CHF, chronic (HCC)   . Acute renal failure (HCC)   . Hypernatremia   . NSTEMI (non-ST elevated myocardial infarction) (HCC) 03/14/2016  . Physical deconditioning 03/14/2016  . Tracheostomy status (HCC)   . Encounter for palliative care   . Goals of care, counseling/discussion   . Tracheostomy care (HCC)   . Respiratory failure (HCC) 03/03/2016  . Acute respiratory failure with hypoxia (HCC)   . Septic shock (HCC)   . Cardiogenic shock (HCC)   . Cardiac arrest (HCC)   . Hydrocephalus   . Altered mental status   . CHF (congestive heart failure) (HCC)   . 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 Homeless 02/16/2016  . Meningitis, streptococcal 02/16/2016  . Tobacco abuse 02/16/2016  . Gram positive sepsis (HCC) 02/16/2016  . Underweight 02/16/2016  . ETOH abuse   . Substance abuse   . Acute encephalopathy   . Acute respiratory failure [facility])     Palliative Care Assessment & Plan   Patient Profile:    Assessment:  acute encephalopathy Seizures Bradycardia Ongoing decline History of cardiac arrest, step pneumo meningitis. S/p VDRF, trach, now removed.  Dysphagia Recently seen by cards for bradycardia, neuro has also been following.   Recommendations/Plan:   Patient's overall condition continues to decline. It is most appropriate to consider DNR DNI and comfort measures. Unfortunately, not able to reach wife this am, additionally, in past conversations, she has stated that she would like to  maintain hope, she would like for him to be resuscitated and that he had some degree of stabilization/recovery in the face of serious illness earlier this year. Hence, remains full code. Agree with d/c to SNF if medically stable. Recommend hospice/palliative follow up at SNF on d/c if possible.    Code Status:    Code Status Orders        Start     Ordered   06/16/16 2225  Full code  Continuous     06/16/16 2227    Code  Status History    Date Active Date Inactive Code Status Order ID Comments User Context   05/20/2016  1:54 AM 06/01/2016  9:21 PM Full Code [id number]  Javier Glazier, MD ED   05/19/2016  8:48 PM 05/20/2016  1:54 AM Full Code [id number]  Etta Quill, DO ED   04/27/2016 11:23 PM 04/29/2016 10:36 PM Full Code [id number]  Edwin Dada, MD Inpatient   03/20/2016  9:35 PM 04/06/2016  1:47 AM Full Code [id number]  Dannielle Burn, MD ED   02/17/2016  1:31 PM 03/10/2016  6:52 PM DNR [id number]  Raylene Miyamoto, MD Inpatient   02/16/2016  7:13 PM 02/17/2016  1:31 PM Full Code [id number]  [place], PA-C ED    Advance Directive Documentation   Flowsheet Row Most Recent Value  Type of Advance Directive  Healthcare Power of Attorney  Pre-existing out of facility DNR order (yellow form or pink MOST form)  No data  "MOST" Form in Place?  No data       Prognosis:   Unable to determine but appears guarded. Patient continues to decline.   Discharge Planning:  To Be Determined  Care plan was discussed with Dr Allyson Sabal. Call placed but unable to reach wife.    Thank you for allowing the Palliative Medicine Team to assist in the care of this patient.     Time In:  8 Time Out:  825 Total Time  25 Prolonged Time Billed  no       Greater than 50%  of this time was spent counseling and coordinating care related to the above assessment and plan.  Loistine Chance, MD [phone]  Please contact Palliative Medicine Team phone at [phone] for questions and concerns.

## 2016-07-04 NOTE — Progress Notes (Signed)
Speech Language Pathology Treatment: Dysphagia  Patient Details Name: Dale [name] MRN: [id number] DOB: [birth year] Today's Date: 07/04/2016 Time: 0737-1062 SLP Time Calculation (min) (ACUTE ONLY): 13 min  Assessment / Plan / Recommendation Clinical Impression  Pt seen for dysphagia followup. Pt continues to demonstrate oral holding with all PO, requiring max verbal and tactile stim to initiate A-P transfer and pharyngeal response. Not appropriate for upgraded texture trials at this time. Contine with Dys 1 with nectar with strict adherence to aspiration precautions. Will follow.    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  Re-admitted 7/13 again presenting with AMS thought to be septic/PNA in origin but had witnessed seizure in ED.  SLP evaluations during previous admissions have recommended Dys 1 diet and nectar thick liquids with advancement to Dys 2 textures prior to discharge.  Intubated 7/13-21.       SLP Plan  Continue with current plan of care     Recommendations  Diet recommendations: Nectar-thick liquid;Dysphagia 1 (puree) Liquids provided via: No straw;Cup Medication Administration: Crushed with puree Supervision: Full supervision/cueing for compensatory strategies;Staff to assist with self feeding Compensations: Slow rate;Small sips/bites;Minimize environmental distractions Postural Changes and/or Swallow Maneuvers: Seated upright 90 degrees             Oral Care Recommendations: Oral care BID Follow up Recommendations: Skilled Nursing facility Plan: Continue with current plan of care     GO                Rocky Crafts MA, CCC-SLP 07/04/2016, 3:17 PM

## 2016-07-04 NOTE — Progress Notes (Signed)
Patient ID: Dale [name], male   DOB: 1966/05/18, 50 y.o.   MRN: [id number]                                                                PROGRESS NOTE                                                                                                                                                                                                             Patient Demographics:    Dale [name], is a 50 y.o. male, DOB - 1966-08-28, IPJ:[id number]  Admit date - 06/16/2016   Admitting Physician Brand Males, MD  Outpatient Primary MD for the patient is No primary care provider on file.  LOS - 18  Outpatient Specialists:  Chief Complaint  Patient presents with  . Aspiration  . Possible Sepsis        Brief Narrative  50 year old male with complex medical history including:  3/14-4/6: Admitted with strep pneumo meningitis, c/b cardiac arrest, and cardiogenic shock (EF 20%, recovered to 50%) briefly on amiodarone, ARDS requiring prolonged intubation and trach (now removed), ARF, shock liver, and adrenal insufficiency. He was discharged on 4/6 with weekly Pen G IM until 4/10. Has been re-admitted several times w/ recurrent infection and seen by ID. Re-admitted 7/13 again presenting with AMS thought to be septic/PNA in origin but had witnessed seizure in ED. He was found to be hypotensive and hypothermic, however WBC and Lactic were wnl. CXR with LLL opacification He required CVL placement and vasopressors for shock despite aggressive IVF resuscitation. ICU admission for shock treatment and continuous EEG. He was intubated in the ED for airway protection and transferred to [facility]. Extubated 7/21. Transferred to [facility] on 7/25.Patient develops signs of adrenal insufficiency again on 7/26 and had to be moved back to stepdown   Subjective:    Dale [name] today looks more alert.  Pt increasingly agitated, attempting to get out of bed. Low bed ordered, floor mats placed, band and socks on,  He is a  total feed.  Pt has episodes of bradycardia.      Assessment  & Plan :    Active Problems:   Acute encephalopathy   Acute respiratory failure (HCC)   Acute respiratory failure with hypercapnia ([place])   Encounter  for central line placement   Pressure ulcer   Hypoglycemia   Bradycardia   Hypoglycemia Unable to tolerate weaning of steroids, continues to have poor oral intake Hypoglycemic again this morning  Bradycardia >currently sinus in 40's-80's H/O Cardiac Arrest w/ NSTEMI H/O Systolic CHF - EF 45 - 70% per echo from June 2017 H/O Hyperlipidemia Continue aspirin and Lipitor Cardiology consultation much appreciated, requested by family. No indication for pacemaker at this time   Acute Encephalopathy - ongoing. Seems to be slowly improving. Occasionally lethargic. Following commands. Waxing and waning mental status, repeat CT scan on 7/26 did not show any evidence of acute intracranial abnormality. 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    Adrenal insufficiency-manifesting as hypothermia hypoglycemia hypertension- Decrease Solu-Cortef 50 mg IV every 8 => increase hydrocortisone to 20 mg 3 times a day Cortisol level was 4.3 on admission  Seizures. -strep pneumo meningitis earlier this year, Continue with Keppra and Depakote, dose adjusted during this hospitalization, continue prn Ativan for seizures,Dale [name] appears to be waxing and waning.He has no evidence of any ongoing seizure . Depakote level therapeutic repeat Keppra and Depakote levels therapeutic  New Subacute Ischemia - extensive workup by neurology as follows H/O strep pneumo meningitis, hydrocephalus, new CVA.Seizure disorder Neurology suspectsmild left upper extremity weakness related to the left basal ganglia infarction.  CT head 7/14:New areas of subacute to chronic ischemia on the left. Progression in the degree of sinusitis as described. EEG  7/14:This was an abnormal EEG due to the presence of moderate background slowing. This slowing is non-specific and is indicative of encephalopathy. MRI 7/18:1. Normal expected interval evolution of previously identified infarct involving the left basal ganglia/corona radiata. Additionally, previously seen signal abnormalities within the bilateral basal ganglia have also largely resolved relative to previous MRI from 03/24/2016. Similarly, changes at the right parietal cortex have also evolved. No new areas of ischemia orinjury identified.  Persistent layering debris/hemorrhage within the lateral ventricles, likely related to recent history of meningitis. No Hydrocephalus. Continuous EEG 7/20: Mild diffuse encephalopathy. No electrographic seizures. Patient remains at high-risk for aspiration with waxing and waning mental status   Sepsis - Possible HCAP-->completed abx course  h/O strep pneumo meningitis (march 2017). Received repeat doses empiric vanco and zosyn 7/22 due to hypothermia, cx's performed D/Ced abx.   Acute Hypoxic Respiratory Failure Unable to Protect Airway - In setting of seizures.Intubated from 7/13-7/21 HCAP , chest x-ray on 7/22 showed left lower lobe collapse H/O Tracheostomy March 2017 - S/P Decannulation. Mobilize-will need rehabilitation Pulm hygiene   Shock - Resolved. Possibly secondary to sepsis.   Hypokalemia - resolved  Hyperchloremia - Mild.  Urinary retention - Foley replaced.,  Trending UOP with Foley. Bethanechol started 7/18.Continue Flomax  Dysphagia  Dysphagia 1 (puree);Honey-thick liquid based on speech therapy evaluation   Anemia - Chronic. No signs of active bleeding.  Stable,  Check cbc in am    Code Status : FULL CODE  Family Communication  : Discussed with patient's wife, patient has not improved neurologically over the last several days of this admission, do not anticipate any meaningful recovery, strongly  recommend DNR/DNI, wife wants me to call her back tomorrow and update her  Disposition Plan  : SNF [facility], anticipate discharge in 2-3 days  Barriers For Discharge :   Consults  :  Cardiology, neurology  Procedures  :   DVT Prophylaxis  :  Lovenox -  SCDs  Lab Results  Component Value Date   PLT 283 07/04/2016    Antibiotics  :   Anti-infectives    Start     Dose/Rate Route Frequency Ordered Stop   06/25/16 2000  vancomycin (VANCOCIN) IVPB 750 mg/150 ml premix  Status:  Discontinued     750 mg 150 mL/hr over 60 Minutes Intravenous Every 12 hours 06/25/16 1900 06/25/16 1901   06/25/16 2000  piperacillin-tazobactam (ZOSYN) IVPB 3.375 g  Status:  Discontinued     3.375 g 12.5 mL/hr over 240 Minutes Intravenous Every 8 hours 06/25/16 1900 06/26/16 0840   06/25/16 2000  vancomycin (VANCOCIN) IVPB 1000 mg/200 mL premix  Status:  Discontinued     1,000 mg 200 mL/hr over 60 Minutes Intravenous Every 12 hours 06/25/16 1901 06/26/16 0840   06/18/16 2000  vancomycin (VANCOCIN) IVPB 1000 mg/200 mL premix  Status:  Discontinued     1,000 mg 200 mL/hr over 60 Minutes Intravenous Every 12 hours 06/18/16 1935 06/20/16 1458   06/17/16 0800  vancomycin (VANCOCIN) IVPB 750 mg/150 ml premix  Status:  Discontinued     750 mg 150 mL/hr over 60 Minutes Intravenous Every 12 hours 06/16/16 1914 06/18/16 0952   06/17/16 0200  piperacillin-tazobactam (ZOSYN) IVPB 3.375 g  Status:  Discontinued     3.375 g 12.5 mL/hr over 240 Minutes Intravenous Every 8 hours 06/16/16 1940 06/23/16 0832   06/16/16 1945  piperacillin-tazobactam (ZOSYN) IVPB 3.375 g  Status:  Discontinued     3.375 g 12.5 mL/hr over 240 Minutes Intravenous Every 8 hours 06/16/16 1914 06/16/16 1940   06/16/16 1730  vancomycin (VANCOCIN) IVPB 1000 mg/200 mL premix     1,000 mg 200 mL/hr over 60 Minutes Intravenous STAT 06/16/16 1727 06/16/16 2030   06/16/16 1730  piperacillin-tazobactam (ZOSYN) IVPB 3.375 g     3.375 g 100 mL/hr  over 30 Minutes Intravenous STAT 06/16/16 1727 06/16/16 1939        Objective:   Vitals:   07/04/16 0700 07/04/16 0800 07/04/16 0900 07/04/16 1000  BP:  91/62  (!) 87/64  Pulse: (!) 42 (!) 44 (!) 50 (!) 53  Resp: (!) 9 (!) _0 Temp:  97.4 F (36.3 C)    TempSrc:  Axillary    SpO2: 100% 100% 100% 100%  Weight:      Height:        Wt Readings from Last 3 Encounters:  07/04/16 66.7 kg (147 lb)  05/30/16 55.4 kg (122 lb 2.2 oz)  05/09/16 55.8 kg (123 lb)     Intake/Output Summary (Last 24 hours) at 07/04/16 1215 Last data filed at 07/04/16 1055  Gross per 24 hour  Intake           1282.5 ml  Output             4952 ml  Net          -3669.5 ml     Physical Exam  Awake Alert, Oriented X ? No new F.N deficits, Normal affect .AT,PERRAL Supple Neck,No JVD, No cervical lymphadenopathy appriciated.  Symmetrical Chest wall movement, Good air movement bilaterally, CTAB RRR,No Gallops,Rubs or new Murmurs, No Parasternal Heave +ve B.Sounds, Abd Soft, No tenderness, No organomegaly appriciated, No rebound - guarding or rigidity. No Cyanosis, Clubbing or edema, No new Rash or bruise  Pt able to move all 4 ext, pt able to sit up in bed    Data Review:    CBC  Recent Labs Lab 06/28/16 0548 06/29/16  4585 06/30/16 9292 07/02/16 0319 07/04/16 0212  WBC 9.8 11.8* 6.7 8.2 10.1  HGB 8.6* 8.1* 8.0* 8.8* 8.1*  HCT 27.7* 25.4* 24.3* 27.7* 25.9*  PLT 283 277 277 319 283  MCV 89.9 90.1 87.4 89.9 89.9  MCH 27.9 28.7 28.8 28.6 28.1  MCHC 31.0 31.9 32.9 31.8 31.3  RDW 16.4* 16.7* 16.6* 17.2* 17.6*    Chemistries   Recent Labs Lab 06/28/16 0548 06/29/16 0418 06/30/16 4462 07/01/16 0224 07/02/16 0319 07/04/16 0212  NA 147* 147* 145 146* 144 147*  K 3.5 3.9 3.3* 4.4 4.6 3.6  CL 107 106 105 110 114* 114*  CO2 32 33* 32 _0 GLUCOSE 77 78 74 91 102* 91  BUN _1 CREATININE 0.91 0.81 0.82 0.98 0.89 0.79  CALCIUM 8.7* 8.9 8.3* 8.2* 8.1* 8.1*  MG  2.2 1.9  --   --   --   --   AST  --  14* _2 12*  ALT  --  _3 16* 12*  ALKPHOS  --  51 46 53 49 44  BILITOT  --  0.3 0.4 0.5 0.5 0.3   ------------------------------------------------------------------------------------------------------------------ No results for input(s): CHOL, HDL, LDLCALC, TRIG, CHOLHDL, LDLDIRECT in the last 72 hours.  Lab Results  Component Value Date   HGBA1C 5.3 05/20/2016   ------------------------------------------------------------------------------------------------------------------  Recent Labs  07/03/16 1501  TSH 1.004   ------------------------------------------------------------------------------------------------------------------ No results for input(s): VITAMINB12, FOLATE, FERRITIN, TIBC, IRON, RETICCTPCT in the last 72 hours.  Coagulation profile No results for input(s): INR, PROTIME in the last 168 hours.  No results for input(s): DDIMER in the last 72 hours.  Cardiac Enzymes No results for input(s): CKMB, TROPONINI, MYOGLOBIN in the last 168 hours.  Invalid input(s): CK ------------------------------------------------------------------------------------------------------------------    Component Value Date/Time   BNP 23.8 03/20/2016 1800    Inpatient Medications  Scheduled Meds: . aspirin  325 mg Per Tube Daily  . atorvastatin  20 mg Oral q1800  . bethanechol  10 mg Oral Q8H  . chlorhexidine gluconate (SAGE KIT)  15 mL Mouth Rinse BID  . divalproex  750 mg Oral Q8H  . enoxaparin (LOVENOX) injection  40 mg Subcutaneous Q24H  . folic acid  1 mg Oral Daily  . hydrocortisone  10 mg Oral TID  . insulin aspart  0-15 Units Subcutaneous TID WC  . insulin aspart  0-5 Units Subcutaneous QHS  . levETIRAcetam  1,500 mg Oral BID  . thiamine  100 mg Oral Daily   Continuous Infusions:   PRN Meds:.sodium chloride, LORazepam, RESOURCE THICKENUP CLEAR, sodium chloride  Micro Results Recent Results (from the past 240 hour(s))   Culture, blood (Routine X 2) w Reflex to ID Panel     Status: None   Collection Time: 06/25/16  7:20 PM  Result Value Ref Range Status   Specimen Description LEFT ANTECUBITAL  Final   Special Requests IN PEDIATRIC BOTTLE 3CC  Final   Culture NO GROWTH 5 DAYS  Final   Report Status 06/30/2016 FINAL  Final  Culture, blood (Routine X 2) w Reflex to ID Panel     Status: None   Collection Time: 06/25/16  7:27 PM  Result Value Ref Range Status   Specimen Description BLOOD LEFT HAND  Final   Special Requests IN PEDIATRIC BOTTLE 2CC  Final   Culture NO GROWTH 5 DAYS  Final   Report Status 06/30/2016 FINAL  Final  Culture, blood (routine x  2)     Status: None (Preliminary result)   Collection Time: 06/30/16 10:00 AM  Result Value Ref Range Status   Specimen Description BLOOD RIGHT ARM  Final   Special Requests BOTTLES DRAWN AEROBIC AND ANAEROBIC 10CC  Final   Culture NO GROWTH 3 DAYS  Final   Report Status PENDING  Incomplete  Culture, blood (routine x 2)     Status: None (Preliminary result)   Collection Time: 06/30/16 10:10 AM  Result Value Ref Range Status   Specimen Description BLOOD RIGHT HAND  Final   Special Requests   Final    BOTTLES DRAWN AEROBIC AND ANAEROBIC 10CC AER [place]ell ANA   Culture NO GROWTH 3 DAYS  Final   Report Status PENDING  Incomplete    Radiology Reports Dg Abd 1 View  Result Date: 06/16/2016 CLINICAL DATA:  Orogastric tube placement. EXAM: ABDOMEN - 1 VIEW COMPARISON:  05/25/2016 FINDINGS: An orogastric tube is now seen with tip overlying the distal stomach in the expected region of the pylorus. A left femoral central venous catheter is seen in place as well as a temperature probe within the bladder. The bowel gas pattern is normal. IMPRESSION: Orogastric tube tip overlies the distal stomach, in expected region of pylorus. Electronically Signed   By: Earle Gell M.D.   On: 06/16/2016 20:50   Ct Head Wo Contrast  Result Date: 06/29/2016 CLINICAL DATA:  Altered  mental status EXAM: CT HEAD WITHOUT CONTRAST TECHNIQUE: Contiguous axial images were obtained from the base of the skull through the vertex without intravenous contrast. COMPARISON:  MRI brain dated 06/21/2016 FINDINGS: No evidence of parenchymal hemorrhage or extra-axial fluid collection. No mass lesion, mass effect, or midline shift. No CT evidence of acute infarction. Lacunar infarcts in the left corona radiata and basal ganglia. Subcortical white matter and periventricular small vessel ischemic changes. Cerebral volume is within normal limits.  No ventriculomegaly. New complete opacification of the bilateral maxillary sinuses. Partial opacification of the bilateral ethmoid and sphenoid sinuses. Partial opacification of the right mastoid air cells. No evidence of calvarial fracture. IMPRESSION: No evidence of acute intracranial abnormality. Lacunar infarcts in the left basal ganglia and corona radiata. Electronically Signed   By: Julian Hy M.D.   On: 06/29/2016 16:31  Ct Head Wo Contrast  Result Date: 06/17/2016 CLINICAL DATA:  Unresponsive EXAM: CT HEAD WITHOUT CONTRAST TECHNIQUE: Contiguous axial images were obtained from the base of the skull through the vertex without intravenous contrast. COMPARISON:  05/19/2016 FINDINGS: The bony calvarium is intact. Increased mucosal thickening is noted within the ethmoid and sphenoid sinus. Air-fluid levels are noted within the maxillary antra bilaterally consistent with a more acute degree of sinusitis. Lacunar infarct is noted within the left basal ganglia and left thalamus extending into the deep white matter new from the prior exam. No findings to suggest acute hemorrhage are noted. IMPRESSION: New areas of subacute to chronic ischemia on the left. Progression in the degree of sinusitis as described. Electronically Signed   By: Inez Catalina M.D.   On: 06/17/2016 13:06   Mr Jeri Cos CN Contrast  Result Date: 06/21/2016 CLINICAL DATA:  Initial evaluation  for recurrent altered mental status, seizures, history of strep pneumo meningitis. Question new areas of and within the left basal ganglia. EXAM: MRI HEAD WITHOUT AND WITH CONTRAST TECHNIQUE: Multiplanar, multiecho pulse sequences of the brain and surrounding structures were obtained without and with intravenous contrast. CONTRAST:  36m MULTIHANCE GADOBENATE DIMEGLUMINE 529 MG/ML IV SOLN COMPARISON:  Comparison made with prior CT from 06/17/2016 as well as previous MRI from 03/24/2016. FINDINGS: Diffuse prominence of the CSF containing spaces compatible with generalized cerebral atrophy. There has been normal interval expected evolution of previously identified acute ischemic infarct involving the left corona radiata/basal ganglia, which now demonstrates progressive encephalomalacia without significant diffusion abnormality. Question minimal faint enhancement within this region extending towards the left cerebral peduncle, likely related to the late subacute ischemia (series 11, image 28). Additional remote infarctions involving the bilateral basal ganglia again seen. Chronic blood products again noted within the left external capsule. No new areas of restricted diffusion to suggest new ischemia or injury. Similar FLAIR signal abnormality at the right parietal cortex. Associated subtle laminar necrosis within this region (series 3, image 6). No significant enhancement now seen within this region. Additional focus of cortical flares abnormality within the right frontal lobe slightly more prominent as compared to previous exam, although this appears to be chronic in nature. No appreciable enhancement within these areas. No other abnormal enhancement elsewhere within the brain. Major intracranial vascular flow voids are preserved. No mass lesion, mass effect, or midline shift. Layering susceptibility artifact within the stent was of both lateral ventricles, likely debris/chronic hemorrhage related to prior meningitis,  similar to prior. No hydrocephalus. Craniocervical junction within normal limits. Visualized upper cervical spine unremarkable. Pituitary gland normal. No acute abnormality about the globes and orbits. Extensive opacity throughout the paranasal sinuses with fluid levels within the sphenoid and maxillary sinuses. Bilateral mastoid effusions. Fluid within the nasopharynx. Patient likely intubated. Bone marrow signal intensity within normal limits. No scalp soft tissue abnormality. IMPRESSION: 1. Normal expected interval evolution of previously identified infarct involving the left basal ganglia/corona radiata. Additionally, previously seen signal abnormalities within the bilateral basal ganglia have also largely resolved relative to previous MRI from 03/24/2016. Similarly, changes at the right parietal cortex have also evolved. No new areas of ischemia or injury identified. 2. Persistent layering debris/hemorrhage within the lateral ventricles, likely related to recent history of meningitis. No hydrocephalus. Electronically Signed   By: Jeannine Boga M.D.   On: 06/21/2016 04:55   Dg Chest Port 1 View  Result Date: 06/30/2016 CLINICAL DATA:  Altered mental status.  Low body temperature. EXAM: PORTABLE CHEST 1 VIEW COMPARISON:  06/25/2016 FINDINGS: Lungs are adequately inflated as patient is slightly rotated to the right. Interval resolution of left base opacification with improvement but mild residual hazy opacification in the right base possibly due to small layering effusions/atelectasis, although cannot exclude infection cardiomediastinal silhouette and remainder of the exam is unchanged. IMPRESSION: Interval improvement with mild residual hazy opacification in the right base likely small layering effusions/atelectasis although cannot exclude infection. Electronically Signed   By: Marin Olp M.D.   On: 06/30/2016 08:56  Dg Chest Port 1 View  Result Date: 06/25/2016 CLINICAL DATA:  Pneumonia. EXAM:  PORTABLE CHEST 1 VIEW COMPARISON:  06/22/2016. FINDINGS: Interval extubation. Left IJ central line tip projects over the SVC. Heart size normal. Left lower lobe collapse/ consolidation. Hazy opacification over the lower hemithoraces bilaterally, new on the right. IMPRESSION: 1. Left lower lobe collapse/consolidation. 2. Hazy opacification over both lower hemithoraces may be due to layering pleural fluid and/or true airspace opacification. Electronically Signed   By: Lorin Picket M.D.   On: 06/25/2016 10:13   Dg Chest Port 1 View  Result Date: 06/22/2016 CLINICAL DATA:  Respiratory failure, acute encephalopathy, cardiogenic shock, acute and chronic CHF, current smoker. EXAM: PORTABLE CHEST 1 VIEW COMPARISON:  Portable  chest x-ray of June 19, 2016 FINDINGS: The right lung is adequately inflated and clear. On the left there is persistent basilar atelectasis or pneumonia with small pleural effusion. The heart is normal in size. The pulmonary vascularity is not engorged. The endotracheal tube tip lies 5.3 cm above the carina. The esophagogastric tube tip projects below the inferior margin of the image. The left internal jugular venous catheter tip projects over the proximal third of the SVC. IMPRESSION: Persistent left lower lobe atelectasis or pneumonia with trace pleural effusion. No CHF. The support tubes are in stable position. Electronically Signed   By: David  Martinique M.D.   On: 06/22/2016 07:20   Dg Chest Portable 1 View  Result Date: 06/19/2016 CLINICAL DATA:  Respiratory failure EXAM: PORTABLE CHEST 1 VIEW COMPARISON:  06/18/2016 FINDINGS: ET tube tip is above the carina. There is a left IJ catheter with tip in the projection of the SVC. Enteric tube tip is below the hemidiaphragm. Persistent opacity within the left lung base. Right lung is clear. IMPRESSION: 1. Stable position of the support apparatus. 2. No change in left base opacity. Electronically Signed   By: Kerby Moors M.D.   On: 06/19/2016  07:44   Dg Chest Port 1 View  Result Date: 06/18/2016 CLINICAL DATA:  50 year old male with a history of respiratory failure EXAM: PORTABLE CHEST 1 VIEW COMPARISON:  06/17/2016, 06/16/2016 FINDINGS: Cardiomediastinal silhouette unchanged in size and contour. Endotracheal tube unchanged, terminating 3.8 cm above the carina. Unchanged left IJ approach central venous catheter, appearing to terminate superior vena cava. Gastric tube projects over the mediastinum, terminating out of the field of view. No visualized pneumothorax. Improved aeration in the left mid lung, with persistent opacity in the retrocardiac region. Blunting of left costophrenic angle. IMPRESSION: Improved aeration of the left lung, with persistent left basilar opacity, potentially consolidation, edema, and/ or atelectasis. Small left pleural effusion not excluded. Unchanged position of endotracheal tube, gastric tube, left IJ central venous catheter. Signed, Dulcy Fanny. Earleen Newport, DO [facility] Electronically Signed   By: Corrie Mckusick D.O.   On: 06/18/2016 10:15   Dg Chest Port 1 View  Result Date: 06/17/2016 CLINICAL DATA:  50 year old male with enteric tube placement. EXAM: PORTABLE CHEST 1 VIEW COMPARISON:  A abdominal radiograph dated 06/16/2016 FINDINGS: An endotracheal tube is noted with tip approximately 3 cm above the carina. Single portable view of the chest demonstrates patchy area of hazy airspace opacity in the left mid to lower lung field. There is no pleural effusion or pneumothorax. The cardiac silhouette is within normal limits. Left IJ central line with tip over central SVC. An enteric tube extends into the epigastric area with tip superimposed over the right L3 pedicle likely in the distal stomach. There is no bowel dilatation or evidence of obstruction. No free air. No radiopaque calculi or foreign object. The soft tissues and osseous structures appear unremarkable. The  previously seen left femoral central venous catheter has been removed. A temperature probe is partially visualized over the bladder. IMPRESSION: Endotracheal tube above the carina. Patchy area of airspace density in the left mid to lower lung field. Follow-up recommended. Enteric tube in the distal stomach.  No bowel obstruction. Electronically Signed   By: Anner Crete M.D.   On: 06/17/2016 02:48   Dg Chest Port 1 View  Result Date: 06/16/2016 CLINICAL DATA:  Endotracheal tube placement EXAM: PORTABLE CHEST 1 VIEW COMPARISON:  06/16/2016 FINDINGS: There is an endotracheal tube with  the tip 3.7 cm above the carina. Nasogastric tube coursing below the diaphragm. There is hazy left lower lobe airspace disease. There is no pleural effusion or pneumothorax. The heart and mediastinal contours are unremarkable. The osseous structures are unremarkable. IMPRESSION: 1. Hazy left lower lobe airspace disease concerning for pneumonia. 2. Endotracheal tube with the tip 3.7 cm above the carina. Electronically Signed   By: Kathreen Devoid   On: 06/16/2016 22:41   Dg Chest Port 1 View  Result Date: 06/16/2016 CLINICAL DATA:  Intubation.  Followup pneumonia. EXAM: PORTABLE CHEST 1 VIEW 8:24 p.m.: COMPARISON:  Portable chest x-ray earlier same date 5:49 p.m. and previously. FINDINGS: Endotracheal tube tip in satisfactory position projecting approximately 6 cm above the carina. Nasogastric tube courses below the diaphragm into the stomach though its tip is not included on the image. Consolidation in the left lower lobe as noted earlier. Developing patchy opacities at the right lung base. IMPRESSION: 1. Support apparatus satisfactory. Endotracheal tube tip approximately 6 cm above the carina. Nasogastric tube courses below the diaphragm into the stomach. 2. Left lower lobe pneumonia as noted earlier. Developing atelectasis and/or pneumonia involving the right lung base. Electronically Signed   By: Evangeline Dakin M.D.   On:  06/16/2016 20:49   Dg Chest Port 1 View  Result Date: 06/16/2016 CLINICAL DATA:  50 year old with possible aspiration and sepsis. EXAM: PORTABLE CHEST 1 VIEW COMPARISON:  05/29/2016, 05/27/2016 and earlier. FINDINGS: Patient is rotated to the left. Cardiomediastinal silhouette unremarkable, unchanged. Airspace consolidation in the retrocardiac left lung base. Lungs otherwise clear. Pulmonary vascularity normal. No visible pleural effusions. IMPRESSION: Left lower lobe pneumonia. Electronically Signed   By: Evangeline Dakin M.D.   On: 06/16/2016 18:22   Dg Abd Portable 1v  Result Date: 06/17/2016 CLINICAL DATA:  50 year old male with enteric tube placement. EXAM: PORTABLE CHEST 1 VIEW COMPARISON:  A abdominal radiograph dated 06/16/2016 FINDINGS: An endotracheal tube is noted with tip approximately 3 cm above the carina. Single portable view of the chest demonstrates patchy area of hazy airspace opacity in the left mid to lower lung field. There is no pleural effusion or pneumothorax. The cardiac silhouette is within normal limits. Left IJ central line with tip over central SVC. An enteric tube extends into the epigastric area with tip superimposed over the right L3 pedicle likely in the distal stomach. There is no bowel dilatation or evidence of obstruction. No free air. No radiopaque calculi or foreign object. The soft tissues and osseous structures appear unremarkable. The previously seen left femoral central venous catheter has been removed. A temperature probe is partially visualized over the bladder. IMPRESSION: Endotracheal tube above the carina. Patchy area of airspace density in the left mid to lower lung field. Follow-up recommended. Enteric tube in the distal stomach.  No bowel obstruction. Electronically Signed   By: Anner Crete M.D.   On: 06/17/2016 02:48   Dg Swallowing Func-speech Pathology  Result Date: 06/29/2016 Objective Swallowing Evaluation: Type of Study: MBS-Modified Barium  Swallow Study Patient Details Name: Dale [name] MRN: [id number] Date of Birth: 1965/12/19 Today's Date: 06/29/2016 Time: SLP Start Time (ACUTE ONLY): 1215-SLP Stop Time (ACUTE ONLY): 1235 SLP Time Calculation (min) (ACUTE ONLY): 20 min Past Medical History: Past Medical History: Diagnosis Date . Acute encephalopathy  . Acute ischemic stroke ([place])  . Acute renal failure ([place])  . Acute respiratory failure with hypoxia ([place])  . AKI (acute kidney injury) ([place]) 02/16/2016 . Altered mental status  . Cardiac arrest ([place])  .  Cardiogenic shock ([place])  . 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 Cerebral thrombosis with cerebral infarction 03/25/2016 . CHF (congestive heart failure) ([place])  . Convulsions/seizures ([place]) 05/04/2016  Seizure disorder - Continue Keppra, Depakote.    . Dementia 04/27/2016 . Depression  . Dysphagia 05/04/2016  Dysphagia - Dysphagia 1 diet per SLP    . ETOH abuse  . Hydrocephalus  . Hyperlipidemia 05/04/2016  Dyslipidemia - Continue statin.    Marland Kitchen Hypernatremia  . Hypothermia 03/20/2016 . Low serum cortisol level (HCC) 05/04/2016  Low a.m. cortisol  - Was 1.2 checked on 04/03/2016  - This likely is reflective of cortisol suppression from steroid use - Tapered steroids off  . Meningitis, pneumococcal, recurrent  . Meningitis, streptococcal 02/16/2016 . Meningoencephalitis  . NSTEMI (non-ST elevated myocardial infarction) ([place]) 03/14/2016 . Protein-calorie malnutrition, severe ([place])  . Respiratory failure ([place]) 03/03/2016 . Septic shock ([place])  . Severe sepsis ([place])  . 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 Substance abuse  . Systolic and diastolic CHF, chronic ([place])  . Tobacco abuse 02/16/2016 . Trichomonal urethritis in male 02/16/2016 . Urinary retention 05/04/2016  Urinary retention - Continue Flomax    Past Surgical History: Past Surgical History: Procedure Laterality Date . ANKLE SURGERY   . CARDIAC CATHETERIZATION N/A 02/17/2016  Procedure: IABP Insertion;  Surgeon: Wellington Hampshire, MD;  Location: [facility];   Service: Cardiovascular;  Laterality: N/A; . TEE WITHOUT CARDIOVERSION N/A 03/29/2016  Procedure: TRANSESOPHAGEAL ECHOCARDIOGRAM (TEE);  Surgeon: Larey Dresser, MD;  Location: [facility] ENDOSCOPY;  Service: Cardiovascular;  Laterality: N/A; HPI: 50 year old male with h/o strep pneumonia meningitis, dysphagia, prior trach (3/17),dementia and hospital course complicated by cardiac arrest and cardiogenic shock in March 2016, ARF, shock liver, adrenal insufficiency presented with acute encephalopathy June admission.  Re-admitted 7/13 again presenting with AMS thought to be septic/PNA in origin but had witnessed seizure in ED.  SLP evaluations during previous admissions have recommended Dys 1 diet and nectar thick liquids with advancement to Dys 2 textures prior to discharge.  Intubated 7/13-21.  Subjective: Pt kept eyes closed during MBS. Appeared lethargic, but accepted po presentations Assessment / Plan / Recommendation CHL IP CLINICAL IMPRESSIONS 06/29/2016 Therapy Diagnosis Mild oral phase dysphagia;Moderate pharyngeal phase dysphagia Clinical Impression Pt presents with mild oral, moderate pharyngeal phase dysphagia, characterized by poor bolus formation and posterior propulsion, resulting in posterior spillage to the vallecula on nectar, honey, and puree consistencies, and at the pyriform sinus on thin liquids. Silent aspiration was noted during the swallow of thin liquids. No penetration  or aspiration was observed on nectar, honey, or puree consistencies, however, risk of episodic aspiration of all consistencies is possible, given poor oral prep and delayed swallow reflex. Solid consistencies were not provided at this time, due to extended ineffective oral bolus formation and propulsion and associated risk of aspiration with fatigue.  Recommend Puree diet and nectar thick liquids with full supervision. Pt needs to be monitored closely during meals to insure oral cavity is clear before provided the next bolus. Safe  swallow precautions were sent back to room with pt. Impact on safety and function Moderate aspiration risk   CHL IP TREATMENT RECOMMENDATION 06/29/2016 Treatment Recommendations Therapy as outlined in treatment plan below   Prognosis 06/29/2016 Prognosis for Safe Diet Advancement Fair Barriers to Reach Goals Cognitive deficits CHL IP DIET RECOMMENDATION 06/29/2016 SLP Diet Recommendations Dysphagia 1 (Puree) solids;Nectar thick liquid Liquid Administration via Straw;Cup Medication Administration Crushed with puree Compensations Slow rate;Small sips/bites;Minimize environmental distractions Postural Changes  Remain semi-upright after after feeds/meals (Comment);Seated upright at 90 degrees   CHL IP OTHER RECOMMENDATIONS 06/29/2016 Recommended Consults -- Oral Care Recommendations Oral care BID Other Recommendations Order thickener from pharmacy;Prohibited food (jello, ice cream, thin soups);Remove water pitcher   CHL IP FOLLOW UP RECOMMENDATIONS 06/29/2016 Follow up Recommendations Skilled Nursing facility   [facility] IP FREQUENCY AND DURATION 06/29/2016 Speech Therapy Frequency (ACUTE ONLY) min 2x/week Treatment Duration 2 weeks    CHL IP ORAL PHASE 06/29/2016 Oral Phase Impaired Oral - Honey Teaspoon Premature spillage;Reduced posterior propulsion Oral - Nectar Teaspoon Premature spillage;Reduced posterior propulsion;Delayed oral transit Oral - Nectar Straw Premature spillage;Reduced posterior propulsion;Delayed oral transit Oral - Thin Straw Premature spillage;Reduced posterior propulsion;Delayed oral transit Oral - Puree Premature spillage;Delayed oral transit;Reduced posterior propulsion  CHL IP CERVICAL ESOPHAGEAL PHASE 06/29/2016 Cervical Esophageal Phase [facility] Shonna Chock 06/29/2016, 1:27 PM Celia B. Bueche, [facility], CCC-SLP [phone]               Time Spent in minutes  31   Baylen Buckner M.D on 07/04/2016 at 12:15 PM  Between 7am to 7pm - Pager - [phone]  After 7pm go to [email or web address] - password [facility]  [facility] -  Office  [phone]

## 2016-07-04 NOTE — Progress Notes (Signed)
Subjective:  No chest pain  Objective:   Vital Signs : Vitals:   07/04/16 0600 07/04/16 0700 07/04/16 0800 07/04/16 0900  BP: (!) 149/74  91/62   Pulse: (!) 40 (!) 42 (!) 44 (!) 50  Resp: (!) 8 (!) 9 (!) 9 14  Temp:   97.4 F (36.3 C)   TempSrc:   Axillary   SpO2: 100% 100% 100% 100%  Weight:      Height:        Intake/Output from previous day:  Intake/Output Summary (Last 24 hours) at 07/04/16 0936 Last data filed at 07/04/16 0800  Gross per 24 hour  Intake           1812.5 ml  Output             4352 ml  Net          -2539.5 ml    I/O since admission: +[phone]  Wt Readings from Last 3 Encounters:  07/04/16 147 lb (66.7 kg)  05/30/16 122 lb 2.2 oz (55.4 kg)  05/09/16 123 lb (55.8 kg)    Medications: . aspirin  325 mg Per Tube Daily  . atorvastatin  20 mg Oral q1800  . bethanechol  10 mg Oral Q8H  . chlorhexidine gluconate (SAGE KIT)  15 mL Mouth Rinse BID  . divalproex  750 mg Oral Q8H  . enoxaparin (LOVENOX) injection  40 mg Subcutaneous Q24H  . folic acid  1 mg Oral Daily  . hydrocortisone  10 mg Oral TID  . insulin aspart  0-15 Units Subcutaneous TID WC  . insulin aspart  0-5 Units Subcutaneous QHS  . levETIRAcetam  1,500 mg Oral BID  . thiamine  100 mg Oral Daily       Physical Exam:   General appearance: sleeping Neck: no adenopathy, no carotid bruit, no JVD, supple, symmetrical, trachea midline and thyroid not enlarged, symmetric, no tenderness/mass/nodules Lungs: clear to auscultation bilaterally Heart: SB at 50 now; no s3; 1/6 sem Abdomen: soft, non-tender; bowel sounds normal; no masses,  no organomegaly Extremities: no edema, redness or tenderness in the calves or thighs Pulses: 2+ and symmetric Skin: Skin color, texture, turgor normal. No rashes or lesions Neurologic: not assessed   Rate: 51  Rhythm: sinus bradycardia  Last 06/20/16 ECG (independently read by me): SB at 45; anterior T wave changes  Lab Results:   Recent Labs  07/02/16 0319 07/04/16 0212  NA 144 147*  K 4.6 3.6  CL 114* 114*  CO2 24 28  GLUCOSE 102* 91  BUN 14 11  CREATININE 0.89 0.79  CALCIUM 8.1* 8.1*    Hepatic Function Latest Ref Rng & Units 07/04/2016 07/02/2016 07/01/2016  Total Protein 6.5 - 8.1 g/dL 4.4(L) 4.9(L) 5.1(L)  Albumin 3.5 - 5.0 g/dL 2.0(L) 2.2(L) 2.1(L)  AST 15 - 41 U/L 12(L) 19 16  ALT 17 - 63 U/L 12(L) 16(L) 19  Alk Phosphatase 38 - 126 U/L 44 49 53  Total Bilirubin 0.3 - 1.2 mg/dL 0.3 0.5 0.5     Recent Labs  07/02/16 0319 07/04/16 0212  WBC 8.2 10.1  HGB 8.8* 8.1*  HCT 27.7* 25.9*  MCV 89.9 89.9  PLT 319 283    No results for input(s): TROPONINI in the last 72 hours.  Invalid input(s): CK, MB  Lab Results  Component Value Date   TSH 1.004 07/03/2016   No results for input(s): HGBA1C in the last 72 hours.   Recent Labs  07/02/16 0319 07/04/16  0212  PROT 4.9* 4.4*  ALBUMIN 2.2* 2.0*  AST 19 12*  ALT 16* 12*  ALKPHOS 49 44  BILITOT 0.5 0.3   No results for input(s): INR in the last 72 hours. BNP (last 3 results)  Recent Labs  03/20/16 1800  BNP 23.8    ProBNP (last 3 results) No results for input(s): PROBNP in the last 8760 hours.   Lipid Panel     Component Value Date/Time   CHOL 161 03/27/2016 0534   TRIG 100 06/17/2016 0357   HDL 53 03/27/2016 0534   CHOLHDL 3.0 03/27/2016 0534   VLDL 16 03/27/2016 0534   LDLCALC 92 03/27/2016 0534      Imaging:  ------------------------------------------------------------------- 05/20/16 ECHO Study Conclusions  - Left ventricle: The cavity size was mildly dilated. Wall   thickness was normal. Systolic function was mildly reduced. The   estimated ejection fraction was in the range of 45% to 50%.   Diffuse hypokinesis. - Mitral valve: There was mild regurgitation. - Atrial septum: No defect or patent foramen ovale was identified.  Assessment/Plan:   Active Problems:   Acute encephalopathy   Acute respiratory failure (HCC)    Acute respiratory failure with hypercapnia ([place])   Encounter for central line placement   Pressure ulcer   Hypoglycemia    1. Sinus Bradycardia with narrow QRS complex.  He is not on any negative chronotropic drugs.  HR now ~ 50.   No need for pacemaker.  Avoid any AV nodal blocking drugs.  2. Encephalopathy  Will sign off.    Troy Sine, MD, [facility] 07/04/2016, 9:36 AM

## 2016-07-04 NOTE — Progress Notes (Signed)
Hypoglycemic Event  CBG: 39  Treatment: D50 IV 50 mL  Symptoms: None  Follow-up CBG: Time:2243 CBG Result:139  Possible Reasons for Event: Inadequate meal intake  Comments/MD notified:Dr. Craige Cotta paged with information regarding hypoglycemia, interventions, and recheck of BG    Dale [name], Mortimer Fries

## 2016-07-05 ENCOUNTER — Inpatient Hospital Stay (HOSPITAL_COMMUNITY): Payer: Medicaid Other

## 2016-07-05 DIAGNOSIS — I429 Cardiomyopathy, unspecified: Secondary | ICD-10-CM

## 2016-07-05 LAB — GLUCOSE, CAPILLARY
GLUCOSE-CAPILLARY: 86 mg/dL (ref 65–99)
GLUCOSE-CAPILLARY: 78 mg/dL (ref 65–99)
GLUCOSE-CAPILLARY: 65 mg/dL (ref 65–99)
GLUCOSE-CAPILLARY: 84 mg/dL (ref 65–99)
GLUCOSE-CAPILLARY: 88 mg/dL (ref 65–99)
Glucose-Capillary: 77 mg/dL (ref 65–99)

## 2016-07-05 LAB — CULTURE, BLOOD (ROUTINE X 2)
CULTURE: NO GROWTH
CULTURE: NO GROWTH

## 2016-07-05 LAB — BLOOD GAS, ARTERIAL
ACID-BASE EXCESS: 8.9 mmol/L — AB (ref 0.0–2.0)
Bicarbonate: 33 mEq/L — ABNORMAL HIGH (ref 20.0–24.0)
DRAWN BY: 277551
FIO2: 0.21
O2 SAT: 75.6 %
PCO2 ART: 45.5 mmHg — AB (ref 35.0–45.0)
PO2 ART: 41.7 mmHg — AB (ref 80.0–100.0)
Patient temperature: 98.6
TCO2: 34.3 mmol/L (ref 0–100)
pH, Arterial: 7.473 — ABNORMAL HIGH (ref 7.350–7.450)

## 2016-07-05 LAB — ECHOCARDIOGRAM COMPLETE
Height: 68 in
WEIGHTICAEL: 2352 [oz_av]

## 2016-07-05 MED ORDER — DEXTROSE 10 % IV SOLN
INTRAVENOUS | Status: DC
  Administered 2016-07-05 – 2016-07-07 (×3): via INTRAVENOUS

## 2016-07-05 MED ORDER — DEXTROSE 10 % IV SOLN
INTRAVENOUS | Status: AC
  Administered 2016-07-05: 08:00:00 via INTRAVENOUS

## 2016-07-05 MED ORDER — DEXTROSE 50 % IV SOLN
INTRAVENOUS | Status: AC
  Administered 2016-07-05: 25 mL
  Filled 2016-07-05: qty 50

## 2016-07-05 MED ORDER — HYDROCORTISONE 20 MG PO TABS
40.0000 mg | ORAL_TABLET | Freq: Three times a day (TID) | ORAL | Status: DC
  Administered 2016-07-05: 40 mg via ORAL
  Filled 2016-07-05 (×3): qty 2

## 2016-07-05 MED ORDER — SODIUM CHLORIDE 0.9 % IV BOLUS (SEPSIS)
1000.0000 mL | Freq: Once | INTRAVENOUS | Status: AC
  Administered 2016-07-05: 1000 mL via INTRAVENOUS

## 2016-07-05 MED ORDER — HYDROCORTISONE NA SUCCINATE PF 100 MG IJ SOLR
50.0000 mg | Freq: Three times a day (TID) | INTRAMUSCULAR | Status: DC
  Administered 2016-07-05 – 2016-07-06 (×2): 50 mg via INTRAVENOUS
  Filled 2016-07-05 (×4): qty 2

## 2016-07-05 NOTE — Progress Notes (Signed)
Wife called and she stated she will be able to make the meeting with the MD tomorrow, but only if she could bring her grandchildren with her. I informed the wife that the hospital does not have any restrictions for children at this time.

## 2016-07-05 NOTE — Progress Notes (Signed)
Pt dranked 240 mls of liquid (ice cream and Ensure mixed) and a full container of vanilla pudding, tolerated them well.

## 2016-07-05 NOTE — Progress Notes (Addendum)
Patient ID: Dale [name], male   DOB: 1966/10/23, 50 y.o.   MRN: [id number]                                                                PROGRESS NOTE                                                                                                                                                                                                             Patient Demographics:    Dale [name], is a 50 y.o. male, DOB - 1966-08-19, ZYS:[id number]  Admit date - 06/16/2016   Admitting Physician Dale Males, MD  Outpatient Primary MD for the patient is No primary care provider on file.  LOS - 19  Outpatient Specialists:  Chief Complaint  Patient presents with  . Aspiration  . Possible Sepsis        Brief Narrative  50 year old male with complex medical history including:  3/14-4/6: Admitted with strep pneumo meningitis, c/b cardiac arrest, and cardiogenic shock (EF 20%, recovered to 50%) briefly on amiodarone, ARDS requiring prolonged intubation and trach (now removed), ARF, shock liver, and adrenal insufficiency. He was discharged on 4/6 with weekly Pen G IM until 4/10. Has been re-admitted several times w/ recurrent infection and seen by ID. Re-admitted 7/13 again presenting with AMS thought to be septic/PNA in origin but had witnessed seizure in ED. He was found to be hypotensive and hypothermic, however WBC and Lactic were wnl. CXR with LLL opacification He required CVL placement and vasopressors for shock despite aggressive IVF resuscitation. ICU admission for shock treatment and continuous EEG. He was intubated in the ED for airway protection and transferred to [facility] [place]. Extubated 7/21. Transferred to [facility] on 7/25.Patient develops signs of adrenal insufficiency again on 7/26 and had to be moved back to stepdown   Subjective:    Dale [name] remains minimally responsive t.  Continues to have intermittent agitation, hypoglycemic last night despite being on D10.      Assessment  & Plan  :    Active Problems:   Acute encephalopathy   Acute respiratory failure (HCC)   Acute respiratory failure with hypercapnia (HCC)   Encounter for central line placement   Pressure ulcer   Hypoglycemia   Bradycardia   Hypoglycemia Unable to  tolerate weaning of steroids, continues to have poor oral intake Hypoglycemic again this morning Increase hydrocortisone supplementation and attempt to discontinue D10 today, patient most likely will be discharged on high dose of hydrocortisone orally  Bradycardia >currently sinus in 40's-80's H/O Cardiac Arrest w/ NSTEMI H/O Systolic CHF - EF 45 - 04% per echo from June 2017 H/O Hyperlipidemia Continue aspirin and Lipitor Cardiology was consulted for bradycardia, sinus bradycardia with narrow QRS complex, no need for pacemaker, avoid AV nodal blocking drugs   Acute Encephalopathy - ongoing. Seems to be slowly improving. Occasionally lethargic. Following commands. Waxing and waning mental status, repeat CT scan on 7/26 did not show any evidence of acute intracranial abnormality. 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    Adrenal insufficiency-manifesting as hypothermia hypoglycemia hypotension- Has not been able to tolerate oral hydrocortisone thus far, increase oral hydrocortisone to 40 mg 3 times a day Cortisol level was 4.3 on admission  Seizures. -strep pneumo meningitis earlier this year, Continue with Keppra and Depakote, dose adjusted during this hospitalization, continue prn Ativan for seizures,Dale [name] appears to be waxing and waning.He has no evidence of any ongoing seizure . Depakote level therapeutic    New Subacute Ischemia - extensive workup by neurology as follows H/O strep pneumo meningitis, hydrocephalus, new CVA.Seizure disorder Neurology suspectsmild left upper extremity weakness related to the left basal ganglia infarction.  CT head 7/14:New areas of  subacute to chronic ischemia on the left. Progression in the degree of sinusitis as described. EEG 7/14:This was an abnormal EEG due to the presence of moderate background slowing. This slowing is non-specific and is indicative of encephalopathy. MRI 7/18:1. Normal expected interval evolution of previously identified infarct involving the left basal ganglia/corona radiata. Additionally, previously seen signal abnormalities within the bilateral basal ganglia have also largely resolved relative to previous MRI from 03/24/2016. Similarly, changes at the right parietal cortex have also evolved. No new areas of ischemia orinjury identified.  Persistent layering debris/hemorrhage within the lateral ventricles, likely related to recent history of meningitis. No Hydrocephalus. Continuous EEG 7/20: Mild diffuse encephalopathy. No electrographic seizures. Patient remains at high-risk for aspiration with waxing and waning mental status   Sepsis - Possible HCAP-->completed abx course  h/O strep pneumo meningitis (march 2017). Received repeat doses empiric vanco and zosyn from 7/13 to 7/22 due to hypothermia, cx's performed  Will repeat chest x-ray to rule out underlying infection as a contributing cause of hypoglycemia   Acute Hypoxic Respiratory Failure Unable to Protect Airway - In setting of seizures.Intubated from 7/13-7/21 HCAP , chest x-ray on 7/22 showed left lower lobe collapse, chest x-ray on 7/27 showed interval improvement with residual hazy opacification in the right base H/O Tracheostomy March 2017 - S/P Decannulation. Mobilize-will need rehabilitation Pulm hygiene   Shock - Resolved. Possibly secondary to sepsis.   Hypokalemia - resolved  Hypernatremia/Hyperchloremia - Mild. Continue to encourage by mouth intake  Urinary retention - Foley replaced.,  Trending UOP with Foley. Bethanechol started 7/18.Continue Flomax  Dysphagia  Dysphagia 1 (puree) nectar thick  liquid based on speech therapy evaluation   Anemia of chronic disease No signs of active bleeding.  Stable,  Check cbc in am    Code Status : FULL CODE  Family Communication  : Discussed with patient's wife, patient has not improved neurologically over the last several days of this admission, do not anticipate any meaningful recovery, wife feels that the patient's interactions with her are at  baseline. Suggested that patient is not a candidate for intubation in the future, recommended DNR/DNI, at this point they have declined palliative care recommendations as well as recommendations multiple physicians consulting on the patient. He remains at high risk of aspiration, reintubation, readmission,  Disposition Plan  : SNF [facility], anticipate discharge tomorrow if no recurrent hypoglycemia overnight  Barriers For Discharge :   Consults  :  Cardiology, neurology  Procedures  :   DVT Prophylaxis  :  Lovenox -  SCDs  Lab Results  Component Value Date   PLT 283 07/04/2016    Antibiotics  :  Vanc/zosyn 7/13-7/22     Objective:   Vitals:   07/04/16 1400 07/04/16 1538 07/04/16 2215 07/05/16 0350  BP: 125/89 122/80 130/84 120/70  Pulse: (!) 53 (!) 47 (!) 56 (!) 45  Resp: '12 18 18 18  '$ Temp:  98.4 F (36.9 C) 98.7 F (37.1 C)   TempSrc:  Axillary Oral   SpO2: 100% 100% 100% 100%  Weight:  66.8 kg (147 lb 3.2 oz)  66.7 kg (147 lb)  Height:        Wt Readings from Last 3 Encounters:  07/05/16 66.7 kg (147 lb)  05/30/16 55.4 kg (122 lb 2.2 oz)  05/09/16 55.8 kg (123 lb)     Intake/Output Summary (Last 24 hours) at 07/05/16 0739 Last data filed at 07/05/16 0700  Gross per 24 hour  Intake            712.5 ml  Output             1800 ml  Net          -1087.5 ml     Physical Exam  Awake Alert, Oriented X ? No new F.N deficits, Normal affect [place].AT,PERRAL Supple Neck,No JVD, No cervical lymphadenopathy appriciated.  Symmetrical Chest wall movement, Good air  movement bilaterally, CTAB RRR,No Gallops,Rubs or new Murmurs, No Parasternal Heave +ve B.Sounds, Abd Soft, No tenderness, No organomegaly appriciated, No rebound - guarding or rigidity. No Cyanosis, Clubbing or edema, No new Rash or bruise  Pt able to move all 4 ext, pt able to sit up in bed    Data Review:    CBC  Recent Labs Lab 06/29/16 0418 06/30/16 0638 07/02/16 0319 07/04/16 0212  WBC 11.8* 6.7 8.2 10.1  HGB 8.1* 8.0* 8.8* 8.1*  HCT 25.4* 24.3* 27.7* 25.9*  PLT 277 277 319 283  MCV 90.1 87.4 89.9 89.9  MCH 28.7 28.8 28.6 28.1  MCHC 31.9 32.9 31.8 31.3  RDW 16.7* 16.6* 17.2* 17.6*    Chemistries   Recent Labs Lab 06/29/16 0418 06/30/16 4287 07/01/16 0224 07/02/16 0319 07/04/16 0212  NA 147* 145 146* 144 147*  K 3.9 3.3* 4.4 4.6 3.6  CL 106 105 110 114* 114*  CO2 33* 32 '26 24 28  '$ GLUCOSE 78 74 91 102* 91  BUN '13 9 13 14 11  '$ CREATININE 0.81 0.82 0.98 0.89 0.79  CALCIUM 8.9 8.3* 8.2* 8.1* 8.1*  MG 1.9  --   --   --   --   AST 14* '16 16 19 '$ 12*  ALT '19 18 19 '$ 16* 12*  ALKPHOS 51 46 53 49 44  BILITOT 0.3 0.4 0.5 0.5 0.3   ------------------------------------------------------------------------------------------------------------------ No results for input(s): CHOL, HDL, LDLCALC, TRIG, CHOLHDL, LDLDIRECT in the last 72 hours.  Lab Results  Component Value Date   HGBA1C 5.3 05/20/2016   ------------------------------------------------------------------------------------------------------------------  Recent Labs  07/03/16 1501  TSH 1.004   ------------------------------------------------------------------------------------------------------------------ No results for input(s): VITAMINB12, FOLATE, FERRITIN, TIBC, IRON, RETICCTPCT in the last 72 hours.  Coagulation profile No results for input(s): INR, PROTIME in the last 168 hours.  No results for input(s): DDIMER in the last 72 hours.  Cardiac Enzymes No results for input(s): CKMB, TROPONINI,  MYOGLOBIN in the last 168 hours.  Invalid input(s): CK ------------------------------------------------------------------------------------------------------------------    Component Value Date/Time   BNP 23.8 03/20/2016 1800    Inpatient Medications  Scheduled Meds: . aspirin  325 mg Per Tube Daily  . atorvastatin  20 mg Oral q1800  . bethanechol  10 mg Oral Q8H  . chlorhexidine gluconate (SAGE KIT)  15 mL Mouth Rinse BID  . divalproex  750 mg Oral Q8H  . enoxaparin (LOVENOX) injection  40 mg Subcutaneous Q24H  . folic acid  1 mg Oral Daily  . hydrocortisone  40 mg Oral TID  . insulin aspart  0-15 Units Subcutaneous TID WC  . insulin aspart  0-5 Units Subcutaneous QHS  . levETIRAcetam  1,500 mg Oral BID  . thiamine  100 mg Oral Daily   Continuous Infusions: . dextrose     PRN Meds:.LORazepam, RESOURCE THICKENUP CLEAR, sodium chloride  Micro Results Recent Results (from the past 240 hour(s))  Culture, blood (Routine X 2) w Reflex to ID Panel     Status: None   Collection Time: 06/25/16  7:20 PM  Result Value Ref Range Status   Specimen Description LEFT ANTECUBITAL  Final   Special Requests IN PEDIATRIC BOTTLE 3CC  Final   Culture NO GROWTH 5 DAYS  Final   Report Status 06/30/2016 FINAL  Final  Culture, blood (Routine X 2) w Reflex to ID Panel     Status: None   Collection Time: 06/25/16  7:27 PM  Result Value Ref Range Status   Specimen Description BLOOD LEFT HAND  Final   Special Requests IN PEDIATRIC BOTTLE 2CC  Final   Culture NO GROWTH 5 DAYS  Final   Report Status 06/30/2016 FINAL  Final  Culture, blood (routine x 2)     Status: None (Preliminary result)   Collection Time: 06/30/16 10:00 AM  Result Value Ref Range Status   Specimen Description BLOOD RIGHT ARM  Final   Special Requests BOTTLES DRAWN AEROBIC AND ANAEROBIC 10CC  Final   Culture NO GROWTH 4 DAYS  Final   Report Status PENDING  Incomplete  Culture, blood (routine x 2)     Status: None (Preliminary  result)   Collection Time: 06/30/16 10:10 AM  Result Value Ref Range Status   Specimen Description BLOOD RIGHT HAND  Final   Special Requests   Final    BOTTLES DRAWN AEROBIC AND ANAEROBIC 10CC AER [place] ANA   Culture NO GROWTH 4 DAYS  Final   Report Status PENDING  Incomplete    Radiology Reports Dg Abd 1 View  Result Date: 06/16/2016 CLINICAL DATA:  Orogastric tube placement. EXAM: ABDOMEN - 1 VIEW COMPARISON:  05/25/2016 FINDINGS: An orogastric tube is now seen with tip overlying the distal stomach in the expected region of the pylorus. A left femoral central venous catheter is seen in place as well as a temperature probe within the bladder. The bowel gas pattern is normal. IMPRESSION: Orogastric tube tip overlies the distal stomach, in expected region of pylorus. Electronically Signed   By: Earle Gell M.D.   On: 06/16/2016 20:50   Ct Head Wo Contrast  Result Date: 06/29/2016 CLINICAL DATA:  Altered mental status EXAM: CT HEAD WITHOUT CONTRAST TECHNIQUE: Contiguous axial images were obtained from the base of the skull through the vertex without intravenous contrast. COMPARISON:  MRI brain dated 06/21/2016 FINDINGS: No evidence of parenchymal hemorrhage or extra-axial fluid collection. No mass lesion, mass effect, or midline shift. No CT evidence of acute infarction. Lacunar infarcts in the left corona radiata and basal ganglia. Subcortical white matter and periventricular small vessel ischemic changes. Cerebral volume is within normal limits.  No ventriculomegaly. New complete opacification of the bilateral maxillary sinuses. Partial opacification of the bilateral ethmoid and sphenoid sinuses. Partial opacification of the right mastoid air cells. No evidence of calvarial fracture. IMPRESSION: No evidence of acute intracranial abnormality. Lacunar infarcts in the left basal ganglia and corona radiata. Electronically Signed   By: Julian Hy M.D.   On: 06/29/2016 16:31  Ct Head Wo  Contrast  Result Date: 06/17/2016 CLINICAL DATA:  Unresponsive EXAM: CT HEAD WITHOUT CONTRAST TECHNIQUE: Contiguous axial images were obtained from the base of the skull through the vertex without intravenous contrast. COMPARISON:  05/19/2016 FINDINGS: The bony calvarium is intact. Increased mucosal thickening is noted within the ethmoid and sphenoid sinus. Air-fluid levels are noted within the maxillary antra bilaterally consistent with a more acute degree of sinusitis. Lacunar infarct is noted within the left basal ganglia and left thalamus extending into the deep white matter new from the prior exam. No findings to suggest acute hemorrhage are noted. IMPRESSION: New areas of subacute to chronic ischemia on the left. Progression in the degree of sinusitis as described. Electronically Signed   By: Inez Catalina M.D.   On: 06/17/2016 13:06   Mr Jeri Cos XQ Contrast  Result Date: 06/21/2016 CLINICAL DATA:  Initial evaluation for recurrent altered mental status, seizures, history of strep pneumo meningitis. Question new areas of and within the left basal ganglia. EXAM: MRI HEAD WITHOUT AND WITH CONTRAST TECHNIQUE: Multiplanar, multiecho pulse sequences of the brain and surrounding structures were obtained without and with intravenous contrast. CONTRAST:  74m MULTIHANCE GADOBENATE DIMEGLUMINE 529 MG/ML IV SOLN COMPARISON:  Comparison made with prior CT from 06/17/2016 as well as previous MRI from 03/24/2016. FINDINGS: Diffuse prominence of the CSF containing spaces compatible with generalized cerebral atrophy. There has been normal interval expected evolution of previously identified acute ischemic infarct involving the left corona radiata/basal ganglia, which now demonstrates progressive encephalomalacia without significant diffusion abnormality. Question minimal faint enhancement within this region extending towards the left cerebral peduncle, likely related to the late subacute ischemia (series 11, image 28).  Additional remote infarctions involving the bilateral basal ganglia again seen. Chronic blood products again noted within the left external capsule. No new areas of restricted diffusion to suggest new ischemia or injury. Similar FLAIR signal abnormality at the right parietal cortex. Associated subtle laminar necrosis within this region (series 3, image 6). No significant enhancement now seen within this region. Additional focus of cortical flares abnormality within the right frontal lobe slightly more prominent as compared to previous exam, although this appears to be chronic in nature. No appreciable enhancement within these areas. No other abnormal enhancement elsewhere within the brain. Major intracranial vascular flow voids are preserved. No mass lesion, mass effect, or midline shift. Layering susceptibility artifact within the stent was of both lateral ventricles, likely debris/chronic hemorrhage related to prior meningitis, similar to prior. No hydrocephalus. Craniocervical junction within normal limits. Visualized upper cervical spine unremarkable. Pituitary gland normal. No acute abnormality about the globes and orbits. Extensive opacity throughout  the paranasal sinuses with fluid levels within the sphenoid and maxillary sinuses. Bilateral mastoid effusions. Fluid within the nasopharynx. Patient likely intubated. Bone marrow signal intensity within normal limits. No scalp soft tissue abnormality. IMPRESSION: 1. Normal expected interval evolution of previously identified infarct involving the left basal ganglia/corona radiata. Additionally, previously seen signal abnormalities within the bilateral basal ganglia have also largely resolved relative to previous MRI from 03/24/2016. Similarly, changes at the right parietal cortex have also evolved. No new areas of ischemia or injury identified. 2. Persistent layering debris/hemorrhage within the lateral ventricles, likely related to recent history of meningitis.  No hydrocephalus. Electronically Signed   By: Jeannine Boga M.D.   On: 06/21/2016 04:55   Dg Chest Port 1 View  Result Date: 06/30/2016 CLINICAL DATA:  Altered mental status.  Low body temperature. EXAM: PORTABLE CHEST 1 VIEW COMPARISON:  06/25/2016 FINDINGS: Lungs are adequately inflated as patient is slightly rotated to the right. Interval resolution of left base opacification with improvement but mild residual hazy opacification in the right base possibly due to small layering effusions/atelectasis, although cannot exclude infection cardiomediastinal silhouette and remainder of the exam is unchanged. IMPRESSION: Interval improvement with mild residual hazy opacification in the right base likely small layering effusions/atelectasis although cannot exclude infection. Electronically Signed   By: Marin Olp M.D.   On: 06/30/2016 08:56  Dg Chest Port 1 View  Result Date: 06/25/2016 CLINICAL DATA:  Pneumonia. EXAM: PORTABLE CHEST 1 VIEW COMPARISON:  06/22/2016. FINDINGS: Interval extubation. Left IJ central line tip projects over the SVC. Heart size normal. Left lower lobe collapse/ consolidation. Hazy opacification over the lower hemithoraces bilaterally, new on the right. IMPRESSION: 1. Left lower lobe collapse/consolidation. 2. Hazy opacification over both lower hemithoraces may be due to layering pleural fluid and/or true airspace opacification. Electronically Signed   By: Lorin Picket M.D.   On: 06/25/2016 10:13   Dg Chest Port 1 View  Result Date: 06/22/2016 CLINICAL DATA:  Respiratory failure, acute encephalopathy, cardiogenic shock, acute and chronic CHF, current smoker. EXAM: PORTABLE CHEST 1 VIEW COMPARISON:  Portable chest x-ray of June 19, 2016 FINDINGS: The right lung is adequately inflated and clear. On the left there is persistent basilar atelectasis or pneumonia with small pleural effusion. The heart is normal in size. The pulmonary vascularity is not engorged. The endotracheal  tube tip lies 5.3 cm above the carina. The esophagogastric tube tip projects below the inferior margin of the image. The left internal jugular venous catheter tip projects over the proximal third of the SVC. IMPRESSION: Persistent left lower lobe atelectasis or pneumonia with trace pleural effusion. No CHF. The support tubes are in stable position. Electronically Signed   By: David  Martinique M.D.   On: 06/22/2016 07:20   Dg Chest Portable 1 View  Result Date: 06/19/2016 CLINICAL DATA:  Respiratory failure EXAM: PORTABLE CHEST 1 VIEW COMPARISON:  06/18/2016 FINDINGS: ET tube tip is above the carina. There is a left IJ catheter with tip in the projection of the SVC. Enteric tube tip is below the hemidiaphragm. Persistent opacity within the left lung base. Right lung is clear. IMPRESSION: 1. Stable position of the support apparatus. 2. No change in left base opacity. Electronically Signed   By: Kerby Moors M.D.   On: 06/19/2016 07:44   Dg Chest Port 1 View  Result Date: 06/18/2016 CLINICAL DATA:  50 year old male with a history of respiratory failure EXAM: PORTABLE CHEST 1 VIEW COMPARISON:  06/17/2016, 06/16/2016 FINDINGS: Cardiomediastinal silhouette unchanged in  size and contour. Endotracheal tube unchanged, terminating 3.8 cm above the carina. Unchanged left IJ approach central venous catheter, appearing to terminate superior vena cava. Gastric tube projects over the mediastinum, terminating out of the field of view. No visualized pneumothorax. Improved aeration in the left mid lung, with persistent opacity in the retrocardiac region. Blunting of left costophrenic angle. IMPRESSION: Improved aeration of the left lung, with persistent left basilar opacity, potentially consolidation, edema, and/ or atelectasis. Small left pleural effusion not excluded. Unchanged position of endotracheal tube, gastric tube, left IJ central venous catheter. Signed, Dulcy Fanny. Earleen Newport, DO Vascular and Interventional Radiology  [facility] Radiology Electronically Signed   By: Corrie Mckusick D.O.   On: 06/18/2016 10:15   Dg Chest Port 1 View  Result Date: 06/17/2016 CLINICAL DATA:  50 year old male with enteric tube placement. EXAM: PORTABLE CHEST 1 VIEW COMPARISON:  A abdominal radiograph dated 06/16/2016 FINDINGS: An endotracheal tube is noted with tip approximately 3 cm above the carina. Single portable view of the chest demonstrates patchy area of hazy airspace opacity in the left mid to lower lung field. There is no pleural effusion or pneumothorax. The cardiac silhouette is within normal limits. Left IJ central line with tip over central SVC. An enteric tube extends into the epigastric area with tip superimposed over the right L3 pedicle likely in the distal stomach. There is no bowel dilatation or evidence of obstruction. No free air. No radiopaque calculi or foreign object. The soft tissues and osseous structures appear unremarkable. The previously seen left femoral central venous catheter has been removed. A temperature probe is partially visualized over the bladder. IMPRESSION: Endotracheal tube above the carina. Patchy area of airspace density in the left mid to lower lung field. Follow-up recommended. Enteric tube in the distal stomach.  No bowel obstruction. Electronically Signed   By: Anner Crete M.D.   On: 06/17/2016 02:48   Dg Chest Port 1 View  Result Date: 06/16/2016 CLINICAL DATA:  Endotracheal tube placement EXAM: PORTABLE CHEST 1 VIEW COMPARISON:  06/16/2016 FINDINGS: There is an endotracheal tube with the tip 3.7 cm above the carina. Nasogastric tube coursing below the diaphragm. There is hazy left lower lobe airspace disease. There is no pleural effusion or pneumothorax. The heart and mediastinal contours are unremarkable. The osseous structures are unremarkable. IMPRESSION: 1. Hazy left lower lobe airspace disease concerning for pneumonia. 2. Endotracheal tube with the tip 3.7 cm above the  carina. Electronically Signed   By: Kathreen Devoid   On: 06/16/2016 22:41   Dg Chest Port 1 View  Result Date: 06/16/2016 CLINICAL DATA:  Intubation.  Followup pneumonia. EXAM: PORTABLE CHEST 1 VIEW 8:24 p.m.: COMPARISON:  Portable chest x-ray earlier same date 5:49 p.m. and previously. FINDINGS: Endotracheal tube tip in satisfactory position projecting approximately 6 cm above the carina. Nasogastric tube courses below the diaphragm into the stomach though its tip is not included on the image. Consolidation in the left lower lobe as noted earlier. Developing patchy opacities at the right lung base. IMPRESSION: 1. Support apparatus satisfactory. Endotracheal tube tip approximately 6 cm above the carina. Nasogastric tube courses below the diaphragm into the stomach. 2. Left lower lobe pneumonia as noted earlier. Developing atelectasis and/or pneumonia involving the right lung base. Electronically Signed   By: Evangeline Dakin M.D.   On: 06/16/2016 20:49   Dg Chest Port 1 View  Result Date: 06/16/2016 CLINICAL DATA:  50 year old with possible aspiration and sepsis. EXAM: PORTABLE CHEST 1 VIEW COMPARISON:  05/29/2016, 05/27/2016 and earlier. FINDINGS: Patient is rotated to the left. Cardiomediastinal silhouette unremarkable, unchanged. Airspace consolidation in the retrocardiac left lung base. Lungs otherwise clear. Pulmonary vascularity normal. No visible pleural effusions. IMPRESSION: Left lower lobe pneumonia. Electronically Signed   By: Evangeline Dakin M.D.   On: 06/16/2016 18:22   Dg Abd Portable 1v  Result Date: 06/17/2016 CLINICAL DATA:  50 year old male with enteric tube placement. EXAM: PORTABLE CHEST 1 VIEW COMPARISON:  A abdominal radiograph dated 06/16/2016 FINDINGS: An endotracheal tube is noted with tip approximately 3 cm above the carina. Single portable view of the chest demonstrates patchy area of hazy airspace opacity in the left mid to lower lung field. There is no pleural effusion or  pneumothorax. The cardiac silhouette is within normal limits. Left IJ central line with tip over central SVC. An enteric tube extends into the epigastric area with tip superimposed over the right L3 pedicle likely in the distal stomach. There is no bowel dilatation or evidence of obstruction. No free air. No radiopaque calculi or foreign object. The soft tissues and osseous structures appear unremarkable. The previously seen left femoral central venous catheter has been removed. A temperature probe is partially visualized over the bladder. IMPRESSION: Endotracheal tube above the carina. Patchy area of airspace density in the left mid to lower lung field. Follow-up recommended. Enteric tube in the distal stomach.  No bowel obstruction. Electronically Signed   By: Anner Crete M.D.   On: 06/17/2016 02:48   Dg Swallowing Func-speech Pathology  Result Date: 06/29/2016 Objective Swallowing Evaluation: Type of Study: MBS-Modified Barium Swallow Study Patient Details Name: Dale [name] MRN: [id number] Date of Birth: 06/26/1966 Today's Date: 06/29/2016 Time: SLP Start Time (ACUTE ONLY): 1215-SLP Stop Time (ACUTE ONLY): 1235 SLP Time Calculation (min) (ACUTE ONLY): 20 min Past Medical History: Past Medical History: Diagnosis Date . Acute encephalopathy  . Acute ischemic stroke ([place])  . Acute renal failure ([place])  . Acute respiratory failure with hypoxia ([place])  . AKI (acute kidney injury) ([place]) 02/16/2016 . Altered mental status  . Cardiac arrest ([place])  . Cardiogenic shock ([place])  . Cerebral septic emboli ([place])  . Cerebral thrombosis with cerebral infarction 03/25/2016 . CHF (congestive heart failure) ([place])  . Convulsions/seizures ([place]) 05/04/2016  Seizure disorder - Continue Keppra, Depakote.    . Dementia 04/27/2016 . Depression  . Dysphagia 05/04/2016  Dysphagia - Dysphagia 1 diet per SLP    . ETOH abuse  . Hydrocephalus  . Hyperlipidemia 05/04/2016  Dyslipidemia - Continue statin.    Marland Kitchen Hypernatremia  . Hypothermia 03/20/2016  . Low serum cortisol level (HCC) 05/04/2016  Low a.m. cortisol  - Was 1.2 checked on 04/03/2016  - This likely is reflective of cortisol suppression from steroid use - Tapered steroids off  . Meningitis, pneumococcal, recurrent  . Meningitis, streptococcal 02/16/2016 . Meningoencephalitis  . NSTEMI (non-ST elevated myocardial infarction) ([place]) 03/14/2016 . Protein-calorie malnutrition, severe ([place])  . Respiratory failure ([place]) 03/03/2016 . Septic shock ([place])  . Severe sepsis ([place])  . 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 Substance abuse  . Systolic and diastolic CHF, chronic ([place])  . Tobacco abuse 02/16/2016 . Trichomonal urethritis in male 02/16/2016 . Urinary retention 05/04/2016  Urinary retention - Continue Flomax    Past Surgical History: Past Surgical History: Procedure Laterality Date . ANKLE SURGERY   . CARDIAC CATHETERIZATION N/A 02/17/2016  Procedure: IABP Insertion;  Surgeon: Wellington Hampshire, MD;  Location: [facility];  Service: Cardiovascular;  Laterality: N/A; . TEE  WITHOUT CARDIOVERSION N/A 03/29/2016  Procedure: TRANSESOPHAGEAL ECHOCARDIOGRAM (TEE);  Surgeon: Larey Dresser, MD;  Location: [facility] ENDOSCOPY;  Service: Cardiovascular;  Laterality: N/A; HPI: 50 year old male with h/o strep pneumonia meningitis, dysphagia, prior trach (3/17),dementia and hospital course complicated by cardiac arrest and cardiogenic shock in March 2016, ARF, shock liver, adrenal insufficiency presented with acute encephalopathy June admission.  Re-admitted 7/13 again presenting with AMS thought to be septic/PNA in origin but had witnessed seizure in ED.  SLP evaluations during previous admissions have recommended Dys 1 diet and nectar thick liquids with advancement to Dys 2 textures prior to discharge.  Intubated 7/13-21.  Subjective: Pt kept eyes closed during MBS. Appeared lethargic, but accepted po presentations Assessment / Plan / Recommendation CHL IP CLINICAL IMPRESSIONS 06/29/2016 Therapy Diagnosis Mild oral phase  dysphagia;Moderate pharyngeal phase dysphagia Clinical Impression Pt presents with mild oral, moderate pharyngeal phase dysphagia, characterized by poor bolus formation and posterior propulsion, resulting in posterior spillage to the vallecula on nectar, honey, and puree consistencies, and at the pyriform sinus on thin liquids. Silent aspiration was noted during the swallow of thin liquids. No penetration  or aspiration was observed on nectar, honey, or puree consistencies, however, risk of episodic aspiration of all consistencies is possible, given poor oral prep and delayed swallow reflex. Solid consistencies were not provided at this time, due to extended ineffective oral bolus formation and propulsion and associated risk of aspiration with fatigue.  Recommend Puree diet and nectar thick liquids with full supervision. Pt needs to be monitored closely during meals to insure oral cavity is clear before provided the next bolus. Safe swallow precautions were sent back to room with pt. Impact on safety and function Moderate aspiration risk   CHL IP TREATMENT RECOMMENDATION 06/29/2016 Treatment Recommendations Therapy as outlined in treatment plan below   Prognosis 06/29/2016 Prognosis for Safe Diet Advancement Fair Barriers to Reach Goals Cognitive deficits CHL IP DIET RECOMMENDATION 06/29/2016 SLP Diet Recommendations Dysphagia 1 (Puree) solids;Nectar thick liquid Liquid Administration via Straw;Cup Medication Administration Crushed with puree Compensations Slow rate;Small sips/bites;Minimize environmental distractions Postural Changes Remain semi-upright after after feeds/meals (Comment);Seated upright at 90 degrees   CHL IP OTHER RECOMMENDATIONS 06/29/2016 Recommended Consults -- Oral Care Recommendations Oral care BID Other Recommendations Order thickener from pharmacy;Prohibited food (jello, ice cream, thin soups);Remove water pitcher   CHL IP FOLLOW UP RECOMMENDATIONS 06/29/2016 Follow up Recommendations Skilled  Nursing facility   [facility] IP FREQUENCY AND DURATION 06/29/2016 Speech Therapy Frequency (ACUTE ONLY) min 2x/week Treatment Duration 2 weeks    CHL IP ORAL PHASE 06/29/2016 Oral Phase Impaired Oral - Honey Teaspoon Premature spillage;Reduced posterior propulsion Oral - Nectar Teaspoon Premature spillage;Reduced posterior propulsion;Delayed oral transit Oral - Nectar Straw Premature spillage;Reduced posterior propulsion;Delayed oral transit Oral - Thin Straw Premature spillage;Reduced posterior propulsion;Delayed oral transit Oral - Puree Premature spillage;Delayed oral transit;Reduced posterior propulsion  CHL IP CERVICAL ESOPHAGEAL PHASE 06/29/2016 Cervical Esophageal Phase [facility] Shonna Chock 06/29/2016, 1:27 PM Celia B. Bueche, [facility], CCC-SLP [phone]               Time Spent in minutes  37   Dale [name] M.D on 07/05/2016 at 7:39 AM  Between 7am to 7pm - Pager - [phone]  After 7pm go to [email or web address] - password [facility]  [facility] -  Office  [phone]

## 2016-07-05 NOTE — NC FL2 (Signed)
[place] LEVEL OF CARE SCREENING TOOL     IDENTIFICATION  Patient Name: Dale [name] Birthdate: 24-Jan-1966 Sex: male Admission Date (Current Location): 06/16/2016  [facility] and [place] Number:  Herbalist and Address:  [facility], [place] [address] [place], [place] [address]      Provider Number: [id number]  Attending Physician Name and Address:  Reyne Dumas, MD  Relative Name and Phone Number:       Current Level of Care: Hospital Recommended Level of Care: [place] Prior Approval Number:    Date Approved/Denied:   PASRR Number: [id number]  Discharge Plan: SNF    Current Diagnoses: Patient Active Problem List   Diagnosis Date Noted  . Bradycardia   . Hypoglycemia 07/02/2016  . Pressure ulcer 06/26/2016  . Acute respiratory failure with hypercapnia ([place])   . Encounter for central line placement   . Acute respiratory failure with hypoxemia ([place])   . Hypothermia 05/19/2016  . Seizure ([place])   . Weak 05/06/2016  . Convulsions/seizures ([place]) 05/04/2016  . Urinary retention 05/04/2016  . Hyperlipidemia 05/04/2016  . Dysphagia 05/04/2016  . Low serum cortisol level ([place]) 05/04/2016  . Dementia 04/27/2016  . 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 Moderate protein-calorie malnutrition ([place])   . Palliative care encounter   . Cerebral septic emboli ([place])   . Sepsis ([place])   . Cerebral thrombosis with cerebral infarction 03/25/2016  . Meningoencephalitis   . Bacterial meningitis   . Acute ischemic stroke ([place])   . Severe sepsis ([place])   . Meningitis, pneumococcal, recurrent   . Systolic and diastolic CHF, chronic ([place])   . Acute renal failure ([place])   . Hypernatremia   . NSTEMI (non-ST elevated myocardial infarction) ([place]) 03/14/2016  . Physical deconditioning 03/14/2016  . Tracheostomy status ([place])   . Encounter for palliative care   . Goals of care, counseling/discussion   . Tracheostomy care ([place])    . Respiratory failure ([place]) 03/03/2016  . Acute respiratory failure with hypoxia ([place])   . Septic shock ([place])   . Cardiogenic shock ([place])   . Cardiac arrest ([place])   . Hydrocephalus   . Altered mental status   . CHF (congestive heart failure) ([place])   . 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 Homeless 02/16/2016  . Meningitis, streptococcal 02/16/2016  . Tobacco abuse 02/16/2016  . Gram positive sepsis ([place]) 02/16/2016  . Underweight 02/16/2016  . ETOH abuse   . Substance abuse   . Acute encephalopathy   . Acute respiratory failure (HCC)     Orientation RESPIRATION BLADDER Height & Weight     Self  Normal External catheter Weight: 147 lb (66.7 kg) (bedscale) Height:  _0  (172.7 cm)  BEHAVIORAL SYMPTOMS/MOOD NEUROLOGICAL BOWEL NUTRITION STATUS      Incontinent    AMBULATORY STATUS COMMUNICATION OF NEEDS Skin   Extensive Assist Verbally Normal                       Personal Care Assistance Level of Assistance  Bathing, Feeding, Dressing Bathing Assistance: Maximum assistance Feeding assistance: Maximum assistance Dressing Assistance: Maximum assistance     Functional Limitations Info  Sight, Hearing, Speech Sight Info: Adequate Hearing Info: Adequate Speech Info: Impaired    SPECIAL CARE FACTORS FREQUENCY  PT (By licensed PT), OT (By licensed OT), Speech therapy     PT Frequency: 3 OT Frequency: 3     Speech Therapy Frequency: 3  Contractures Contractures Info: Present    Additional Factors Info  Code Status, Allergies, Insulin Sliding Scale, Isolation Precautions Code Status Info: Full Code Allergies Info: No Known Allergies   Insulin Sliding Scale Info: Novolog Every 4 Hours Isolation Precautions Info: MRSA by PCR 03/20/2016     Current Medications (07/05/2016):  This is the current hospital active medication list Current Facility-Administered Medications  Medication Dose Route Frequency Provider Last Rate Last Dose  . aspirin tablet  325 mg  325 mg Per Tube Daily Rahul P Desai, PA-C   325 mg at 07/05/16 0932  . atorvastatin (LIPITOR) tablet 20 mg  20 mg Oral q1800 Erick Colace, NP   20 mg at 07/04/16 1659  . bethanechol (URECHOLINE) tablet 10 mg  10 mg Oral Q8H Erick Colace, NP   Stopped at 07/05/16 1400  . chlorhexidine gluconate (SAGE KIT) (PERIDEX) 0.12 % solution 15 mL  15 mL Mouth Rinse BID Brand Males, MD   15 mL at 07/05/16 0800  . divalproex (DEPAKOTE) DR tablet 750 mg  750 mg Oral Q8H Jani Gravel, MD   750 mg at 07/05/16 0529  . enoxaparin (LOVENOX) injection 40 mg  40 mg Subcutaneous Q24H Jani Gravel, MD   40 mg at 07/04/16 1354  . folic acid (FOLVITE) tablet 1 mg  1 mg Oral Daily Erick Colace, NP   1 mg at 07/05/16 0932  . hydrocortisone sodium succinate (SOLU-CORTEF) 100 MG injection 50 mg  50 mg Intravenous Q8H Nayana Abrol, MD      . insulin aspart (novoLOG) injection 0-15 Units  0-15 Units Subcutaneous TID WC Collene Gobble, MD      . insulin aspart (novoLOG) injection 0-5 Units  0-5 Units Subcutaneous QHS Collene Gobble, MD      . levETIRAcetam (KEPPRA) tablet 1,500 mg  1,500 mg Oral BID Jani Gravel, MD   1,500 mg at 07/05/16 0932  . LORazepam (ATIVAN) injection 0.5 mg  0.5 mg Intravenous Q6H PRN Jani Gravel, MD   0.5 mg at 07/03/16 2346  . RESOURCE THICKENUP CLEAR   Oral PRN Reyne Dumas, MD      . sodium chloride 0.9 % bolus 1,000 mL  1,000 mL Intravenous Once Reyne Dumas, MD      . sodium chloride 0.9 % bolus 500 mL  500 mL Intravenous Q6H PRN Reyne Dumas, MD   500 mL at 07/02/16 1208  . thiamine (VITAMIN B-1) tablet 100 mg  100 mg Oral Daily Chesley Mires, MD   100 mg at 07/05/16 0932     Discharge Medications: Please see discharge summary for a list of discharge medications.  Relevant Imaging Results:  Relevant Lab Results:   Additional Information SSN [id number]  Candie Chroman, LCSW

## 2016-07-05 NOTE — Progress Notes (Signed)
Pt CBG 65. Given half of D50. Will recheck. Will continue to monitor.

## 2016-07-05 NOTE — Progress Notes (Signed)
  Echocardiogram 2D Echocardiogram has been performed.  Dale [name] 07/05/2016, 2:20 PM

## 2016-07-05 NOTE — Clinical Social Work Note (Signed)
Tresa Endo, admissions coordinator at [facility], called CSW and stated that there was not a bed hold in the locked unit. Tresa Endo asked if patient could be on regular unit. CSW stated that this was likely an option due to patient being minimally responsive. Will discuss with MD/RN/palliative.  SNF aware of likely discharge tomorrow.  Charlynn Court, CSW [phone]

## 2016-07-05 NOTE — Progress Notes (Signed)
BG decreased to 39 and D50 IV was given, no s/s noted, rechecked BG of 139, will continue to monitor, Dr. Craige Cotta notified by page, no return call at this time.

## 2016-07-05 NOTE — Clinical Social Work Note (Signed)
CSW continues to follow fir discharge needs.   Charlynn Court, CSW [phone]

## 2016-07-05 NOTE — Progress Notes (Signed)
Speech Language Pathology Treatment: Dysphagia  Patient Details Name: Dale [name] MRN: [id number] DOB: Jun 20, 1966 Today's Date: 07/05/2016 Time: 1017-5102 SLP Time Calculation (min) (ACUTE ONLY): 8 min  Assessment / Plan / Recommendation Clinical Impression  Pt is lethargic, requiring Max multimodal cueing for brief periods of increased arousal. Nectar thick liquids trialed by spoon, with up to 45 seconds for A/P transfer despite Max cues from SLP. Pt not able to maintain alertness sufficiently to continue PO intake. Discussed pt with NT, who assisted pt with breakfast. Encouraged feeding only when maximally alert. Also reviewed results of most recent MBS which demonstrated silent aspiration of thin liquids. Will continue to follow for tolerance, as pt's mentation is not allowing for adequate nutritional intake.   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  Re-admitted 7/13 again presenting with AMS thought to be septic/PNA in origin but had witnessed seizure in ED.  SLP evaluations during previous admissions have recommended Dys 1 diet and nectar thick liquids with advancement to Dys 2 textures prior to discharge.  Intubated 7/13-21.       SLP Plan  Continue with current plan of care     Recommendations  Diet recommendations: Dysphagia 1 (puree);Nectar-thick liquid (only when alert) Liquids provided via: No straw;Cup Medication Administration: Crushed with puree Supervision: Full supervision/cueing for compensatory strategies;Staff to assist with self feeding Compensations: Slow rate;Small sips/bites;Minimize environmental distractions Postural Changes and/or Swallow Maneuvers: Seated upright 90 degrees             Oral Care Recommendations: Oral care  BID Follow up Recommendations: Skilled Nursing facility Plan: Continue with current plan of care     GO                Dale [name] 07/05/2016, 11:11 AM  Dale [name], M.A. CCC-SLP [phone]

## 2016-07-05 NOTE — Progress Notes (Signed)
PT Cancellation Note  Patient Details Name: Dale [name] MRN: [id number] DOB: [birth year]   Cancelled Treatment:    Reason Eval/Treat Not Completed: Fatigue/lethargy limiting ability to participate;Medical issues which prohibited therapy.  Per RN pt difficult to arouse.  Will hold PT until pt able to participate in therapy.  Encarnacion Chu PT, DPT  Pager: [phone] Phone: [phone] 07/05/2016, 2:16 PM

## 2016-07-05 NOTE — Progress Notes (Addendum)
During the hand off time pt's IV found pulled off by patient, put the new IV stat order (pt need D10) , at this time pt is totally confused and not responding anything to me, but opening eyes and follow command very hardly (after asking frequently then only pt open eyes), risk of aspiration at this time as pt is totally confused for me, so not comfortable giving him PO medicines, will let MD know, Vitals (temp 96 (axillary as pt didn't open mouth), BP 98/68, pulse 54 and oxygen sat 100% at RA, will continue to monitor  Lonia Farber, RN

## 2016-07-06 ENCOUNTER — Inpatient Hospital Stay (HOSPITAL_COMMUNITY): Payer: Medicaid Other

## 2016-07-06 DIAGNOSIS — E2740 Unspecified adrenocortical insufficiency: Secondary | ICD-10-CM

## 2016-07-06 LAB — GLUCOSE, CAPILLARY
GLUCOSE-CAPILLARY: 106 mg/dL — AB (ref 65–99)
GLUCOSE-CAPILLARY: 73 mg/dL (ref 65–99)
Glucose-Capillary: 95 mg/dL (ref 65–99)
Glucose-Capillary: 80 mg/dL (ref 65–99)

## 2016-07-06 LAB — COMPREHENSIVE METABOLIC PANEL
ALBUMIN: 2 g/dL — AB (ref 3.5–5.0)
ALT: 12 U/L — ABNORMAL LOW (ref 17–63)
ANION GAP: 10 (ref 5–15)
AST: 13 U/L — ABNORMAL LOW (ref 15–41)
Alkaline Phosphatase: 48 U/L (ref 38–126)
BUN: 9 mg/dL (ref 6–20)
CHLORIDE: 107 mmol/L (ref 101–111)
CO2: 29 mmol/L (ref 22–32)
Calcium: 8.3 mg/dL — ABNORMAL LOW (ref 8.9–10.3)
Creatinine, Ser: 0.76 mg/dL (ref 0.61–1.24)
GFR calc Af Amer: >60 mL/min (ref >60)
GFR calc non Af Amer: >60 mL/min (ref >60)
GLUCOSE: 79 mg/dL (ref 65–99)
POTASSIUM: 3.8 mmol/L (ref 3.5–5.1)
SODIUM: 146 mmol/L — AB (ref 135–145)
Total Bilirubin: 0.2 mg/dL — ABNORMAL LOW (ref 0.3–1.2)
Total Protein: 4.7 g/dL — ABNORMAL LOW (ref 6.5–8.1)

## 2016-07-06 LAB — CBC
HEMATOCRIT: 29.4 % — AB (ref 39.0–52.0)
HEMOGLOBIN: 9.3 g/dL — AB (ref 13.0–17.0)
MCH: 28.5 pg (ref 26.0–34.0)
MCHC: 31.6 g/dL (ref 30.0–36.0)
MCV: 90.2 fL (ref 78.0–100.0)
Platelets: 247 10*3/uL (ref 150–400)
RBC: 3.26 MIL/uL — ABNORMAL LOW (ref 4.22–5.81)
RDW: 17.9 % — ABNORMAL HIGH (ref 11.5–15.5)
WBC: 8.2 10*3/uL (ref 4.0–10.5)

## 2016-07-06 MED ORDER — HYDROCORTISONE 20 MG PO TABS
80.0000 mg | ORAL_TABLET | ORAL | Status: DC
  Administered 2016-07-06 – 2016-07-08 (×6): 80 mg via ORAL
  Filled 2016-07-06 (×6): qty 4

## 2016-07-06 MED ORDER — FLUDROCORTISONE ACETATE 0.1 MG PO TABS
0.1000 mg | ORAL_TABLET | Freq: Every day | ORAL | Status: DC
  Administered 2016-07-06 – 2016-07-11 (×6): 0.1 mg via ORAL
  Filled 2016-07-06 (×6): qty 1

## 2016-07-06 NOTE — Progress Notes (Signed)
Regarding Hydrocortisone Oral Dose: Patient has been on mostly IV hydrocortisone during his stay but has intermittently received PO dosing. Only one of the PO doses was charted as 'not given' due to NPO status. This was on the evening of 7/26. This was discussed with Dr. Susie Cassette.    Lysle Pearl, PharmD, BCPS 07/06/2016 12:37 PM

## 2016-07-06 NOTE — Progress Notes (Signed)
RN paged and asked for review of CT head that was performed tonight after pt fell around 1845 or so. CT ordered by attending. Read report with ? Acute vs subacute infarct. Called neuro. Dr. Amada Jupiter reviewed CT and stated he wasn't sure but didn't think this was anything new. MRI tomorrow. No change to current tx plan tonight. Depending on MRI results, neuro can be reconsulted in am. Report to attending.  KJKG, NP [facility]

## 2016-07-06 NOTE — Progress Notes (Addendum)
Pt's vitals stable whole night and oxygen saturation maintained at 95-100% in RA, was successful to give medicines as pt responded while trying continuously real hard, IV fluid continue, slept on and off overnight,will continue to monitor the patient.

## 2016-07-06 NOTE — Progress Notes (Addendum)
Patient ID: Dale [name], male   DOB: 05-13-1966, 50 y.o.   MRN: [id number]                                                                PROGRESS NOTE                                                                                                                                                                                                             Patient Demographics:    Dale [name], is a 49 y.o. male, DOB - Jan 08, 1966, JJH:[id number]  Admit date - 06/16/2016   Admitting Physician Brand Males, MD  Outpatient Primary MD for the patient is No primary care provider on file.  LOS - 20  Outpatient Specialists:  Chief Complaint  Patient presents with  . Aspiration  . Possible Sepsis        Brief Narrative  50 year old male with complex medical history including:  3/14-4/6: Admitted with strep pneumo meningitis, c/b cardiac arrest, and cardiogenic shock (EF 20%, recovered to 50%) briefly on amiodarone, ARDS requiring prolonged intubation and trach (now removed), ARF, shock liver, and adrenal insufficiency. He was discharged on 4/6 with weekly Pen G IM until 4/10. Has been re-admitted several times w/ recurrent infection and seen by ID. Re-admitted 7/13 again presenting with AMS thought to be septic/PNA in origin but had witnessed seizure in ED. He was found to be hypotensive and hypothermic, however WBC and Lactic were wnl. CXR with LLL opacification He required CVL placement and vasopressors for shock despite aggressive IVF resuscitation. ICU admission for shock treatment and continuous EEG. He was intubated in the ED for airway protection and transferred to [facility]. Extubated 7/21. Transferred to [facility] on 7/25.Patient develops signs of adrenal insufficiency again on 7/26 and had to be moved back to stepdown   Subjective:    Dale [name] remains minimally Interactive, vitals stable whole night and oxygen saturation maintained at 95-100% in RA   Assessment  & Plan :    Active  Problems:   Acute encephalopathy   Acute respiratory failure (HCC)   Acute respiratory failure with hypercapnia (HCC)   Encounter for central line placement   Pressure ulcer   Hypoglycemia   Bradycardia   Hypoglycemia due to adrenal insufficiency Unable to tolerate  weaning of steroids, continues to have poor oral intake  Continues to be on D10 infusion and 50 mL/h We have been unable to discontinue D10, we had to resume IV hydrocortisone yesterday Not sure if the patient is missing a few oral doses of hydrocortisone because of his encephalopathy  Bradycardia >currently sinus in 40's-80's H/O Cardiac Arrest w/ NSTEMI H/O Systolic CHF - EF 45 - 50% per echo from June 2017 H/O Hyperlipidemia Continue aspirin and Lipitor Cardiology was consulted for bradycardia, sinus bradycardia with narrow QRS complex, no need for pacemaker, avoid AV nodal blocking drugs   Acute Encephalopathy - ongoing. Seems to be slowly improving. Occasionally lethargic. Following commands. Waxing and waning mental status, repeat CT scan on 7/26 did not show any evidence of acute intracranial abnormality. Lacunar infarcts in the left basal ganglia present on CT scan on 7/14 Patient has been evaluated extensively by neurology during this admission, without any improvement in his mental status   Adrenal insufficiency-manifesting as hypothermia hypoglycemia hypotension-consulted Dr. Lucianne Muss from endocrinology Has not been able to tolerate oral hydrocortisone thus far frequently switching back to IV hydrocortisone,  Cortisol level was 4.3 on admission, was 1.2 on 4/17  Seizures. -strep pneumo meningitis earlier this year, Continue with Keppra and Depakote, dose adjusted during this hospitalization, continue prn Ativan for seizures,Zael appears to be waxing and waning.He has no evidence of any ongoing seizure . Depakote level therapeutic    New Subacute Ischemia - extensive workup by neurology as follows H/O  strep pneumo meningitis, hydrocephalus, new CVA.Seizure disorder Neurology suspectsmild left upper extremity weakness related to the left basal ganglia infarction.  CT head 7/14:New areas of subacute to chronic ischemia on the left. Progression in the degree of sinusitis as described. EEG 7/14:This was an abnormal EEG due to the presence of moderate background slowing. This slowing is non-specific and is indicative of encephalopathy. MRI 7/18:1. Normal expected interval evolution of previously identified infarct involving the left basal ganglia/corona radiata. Additionally, previously seen signal abnormalities within the bilateral basal ganglia have also largely resolved relative to previous MRI from 03/24/2016. Similarly, changes at the right parietal cortex have also evolved. No new areas of ischemia orinjury identified.  Persistent layering debris/hemorrhage within the lateral ventricles, likely related to recent history of meningitis. No Hydrocephalus. Continuous EEG 7/20: Mild diffuse encephalopathy. No electrographic seizures. Patient remains at high-risk for aspiration with waxing and waning mental status   Sepsis - Possible HCAP-->completed abx course  h/O strep pneumo meningitis (march 2017). Received repeat doses empiric vanco and zosyn from 7/13 to 7/22 due to hypothermia, cx's performed No evidence of any underlying infection at this time   Acute Hypoxic Respiratory Failure Unable to Protect Airway - In setting of seizures.Intubated from 7/13-7/21 HCAP , chest x-ray on 7/22 showed left lower lobe collapse, chest x-ray on 7/27 showed interval improvement with residual hazy opacification in the right base H/O Tracheostomy March 2017 - S/P Decannulation. Mobilize-will need rehabilitation Pulm hygiene   Shock - Resolved. Possibly secondary to sepsis.   Hypokalemia - resolved  Hypernatremia/Hyperchloremia - Mild. Continue to encourage by mouth  intake  Urinary retention - Foley replaced.,  Trending UOP with Foley. Bethanechol started 7/18.Continue Flomax  Dysphagia  Dysphagia 1 (puree) nectar thick liquid based on speech therapy evaluation   Anemia of chronic disease No signs of active bleeding.  Stable,  Check cbc in am    Code Status : FULL CODE  Family Communication  : Discussed with patient's wife, patient has not  improved neurologically over the last several days of this admission, do not anticipate any meaningful recovery, wife feels that the patient's interactions with her are at baseline. Suggested that patient is not a candidate for intubation in the future, recommended DNR/DNI, at this point they have declined palliative care recommendations as well as recommendations multiple physicians consulting on the patient. He remains at high risk of aspiration, reintubation, readmission, TRIED TO REACH daughter on 8/2 at [phone] multiple times and requested her to come for a multidisciplinary meeting on 8/3  Disposition Plan  : SNF [facility], anticipate discharge tomorrow if no recurrent hypoglycemia overnight  Barriers For Discharge :   Consults  :  Cardiology, neurology  Procedures  :   DVT Prophylaxis  :  Lovenox -  SCDs  Lab Results  Component Value Date   PLT 247 07/06/2016    Antibiotics  :  Vanc/zosyn 7/13-7/22     Objective:   Vitals:   07/05/16 1937 07/05/16 2000 07/06/16 0200 07/06/16 0416  BP: 98/68 106/66 110/60 129/76  Pulse: (!) 54 (!) 53 (!) 50 (!) 48  Resp: 13   18  Temp: (!) 95 F (35 C) 97.6 F (36.4 C)  97 F (36.1 C)  TempSrc: Axillary Axillary  Axillary  SpO2: 99% 100% 100% 100%  Weight:    65.4 kg (144 lb 3.2 oz)  Height:        Wt Readings from Last 3 Encounters:  07/06/16 65.4 kg (144 lb 3.2 oz)  05/30/16 55.4 kg (122 lb 2.2 oz)  05/09/16 55.8 kg (123 lb)     Intake/Output Summary (Last 24 hours) at 07/06/16 1211 Last data filed at 07/06/16 0143  Gross  per 24 hour  Intake              120 ml  Output             1675 ml  Net            -1555 ml     Physical Exam  Awake Alert, Oriented X ? No new F.N deficits, Normal affect [place].AT,PERRAL Supple Neck,No JVD, No cervical lymphadenopathy appriciated.  Symmetrical Chest wall movement, Good air movement bilaterally, CTAB RRR,No Gallops,Rubs or new Murmurs, No Parasternal Heave +ve B.Sounds, Abd Soft, No tenderness, No organomegaly appriciated, No rebound - guarding or rigidity. No Cyanosis, Clubbing or edema, No new Rash or bruise  Pt able to move all 4 ext, pt able to sit up in bed    Data Review:    CBC  Recent Labs Lab 06/30/16 0638 07/02/16 0319 07/04/16 0212 07/06/16 0322  WBC 6.7 8.2 10.1 8.2  HGB 8.0* 8.8* 8.1* 9.3*  HCT 24.3* 27.7* 25.9* 29.4*  PLT 277 319 283 247  MCV 87.4 89.9 89.9 90.2  MCH 28.8 28.6 28.1 28.5  MCHC 32.9 31.8 31.3 31.6  RDW 16.6* 17.2* 17.6* 17.9*    Chemistries   Recent Labs Lab 06/30/16 0638 07/01/16 0224 07/02/16 0319 07/04/16 0212 07/06/16 0322  NA 145 146* 144 147* 146*  K 3.3* 4.4 4.6 3.6 3.8  CL 105 110 114* 114* 107  CO2 32 _0 GLUCOSE 74 91 102* 91 79  BUN _1 CREATININE 0.82 0.98 0.89 0.79 0.76  CALCIUM 8.3* 8.2* 8.1* 8.1* 8.3*  AST _2 12* 13*  ALT 18 19 16* 12* 12*  ALKPHOS 46 53 49 44 48  BILITOT 0.4 0.5 0.5 0.3 0.2*   ------------------------------------------------------------------------------------------------------------------  No results for input(s): CHOL, HDL, LDLCALC, TRIG, CHOLHDL, LDLDIRECT in the last 72 hours.  Lab Results  Component Value Date   HGBA1C 5.3 05/20/2016   ------------------------------------------------------------------------------------------------------------------  Recent Labs  07/03/16 1501  TSH 1.004   ------------------------------------------------------------------------------------------------------------------ No results for input(s):  VITAMINB12, FOLATE, FERRITIN, TIBC, IRON, RETICCTPCT in the last 72 hours.  Coagulation profile No results for input(s): INR, PROTIME in the last 168 hours.  No results for input(s): DDIMER in the last 72 hours.  Cardiac Enzymes No results for input(s): CKMB, TROPONINI, MYOGLOBIN in the last 168 hours.  Invalid input(s): CK ------------------------------------------------------------------------------------------------------------------    Component Value Date/Time   BNP 23.8 03/20/2016 1800    Inpatient Medications  Scheduled Meds: . aspirin  325 mg Per Tube Daily  . atorvastatin  20 mg Oral q1800  . bethanechol  10 mg Oral Q8H  . chlorhexidine gluconate (SAGE KIT)  15 mL Mouth Rinse BID  . divalproex  750 mg Oral Q8H  . enoxaparin (LOVENOX) injection  40 mg Subcutaneous Q24H  . folic acid  1 mg Oral Daily  . hydrocortisone sod succinate (SOLU-CORTEF) inj  50 mg Intravenous Q8H  . insulin aspart  0-15 Units Subcutaneous TID WC  . insulin aspart  0-5 Units Subcutaneous QHS  . levETIRAcetam  1,500 mg Oral BID  . thiamine  100 mg Oral Daily   Continuous Infusions: . dextrose 50 mL/hr at 07/05/16 1530   PRN Meds:.LORazepam, RESOURCE THICKENUP CLEAR, sodium chloride  Micro Results Recent Results (from the past 240 hour(s))  Culture, blood (routine x 2)     Status: None   Collection Time: 06/30/16 10:00 AM  Result Value Ref Range Status   Specimen Description BLOOD RIGHT ARM  Final   Special Requests BOTTLES DRAWN AEROBIC AND ANAEROBIC 10CC  Final   Culture NO GROWTH 5 DAYS  Final   Report Status 07/05/2016 FINAL  Final  Culture, blood (routine x 2)     Status: None   Collection Time: 06/30/16 10:10 AM  Result Value Ref Range Status   Specimen Description BLOOD RIGHT HAND  Final   Special Requests   Final    BOTTLES DRAWN AEROBIC AND ANAEROBIC 10CC AER [place] ANA   Culture NO GROWTH 5 DAYS  Final   Report Status 07/05/2016 FINAL  Final    Radiology Reports Dg Abd 1  View  Result Date: 06/16/2016 CLINICAL DATA:  Orogastric tube placement. EXAM: ABDOMEN - 1 VIEW COMPARISON:  05/25/2016 FINDINGS: An orogastric tube is now seen with tip overlying the distal stomach in the expected region of the pylorus. A left femoral central venous catheter is seen in place as well as a temperature probe within the bladder. The bowel gas pattern is normal. IMPRESSION: Orogastric tube tip overlies the distal stomach, in expected region of pylorus. Electronically Signed   By: Earle Gell M.D.   On: 06/16/2016 20:50   Ct Head Wo Contrast  Result Date: 06/29/2016 CLINICAL DATA:  Altered mental status EXAM: CT HEAD WITHOUT CONTRAST TECHNIQUE: Contiguous axial images were obtained from the base of the skull through the vertex without intravenous contrast. COMPARISON:  MRI brain dated 06/21/2016 FINDINGS: No evidence of parenchymal hemorrhage or extra-axial fluid collection. No mass lesion, mass effect, or midline shift. No CT evidence of acute infarction. Lacunar infarcts in the left corona radiata and basal ganglia. Subcortical white matter and periventricular small vessel ischemic changes. Cerebral volume is within normal limits.  No ventriculomegaly. New complete opacification of the bilateral maxillary  sinuses. Partial opacification of the bilateral ethmoid and sphenoid sinuses. Partial opacification of the right mastoid air cells. No evidence of calvarial fracture. IMPRESSION: No evidence of acute intracranial abnormality. Lacunar infarcts in the left basal ganglia and corona radiata. Electronically Signed   By: Julian Hy M.D.   On: 06/29/2016 16:31  Ct Head Wo Contrast  Result Date: 06/17/2016 CLINICAL DATA:  Unresponsive EXAM: CT HEAD WITHOUT CONTRAST TECHNIQUE: Contiguous axial images were obtained from the base of the skull through the vertex without intravenous contrast. COMPARISON:  05/19/2016 FINDINGS: The bony calvarium is intact. Increased mucosal thickening is noted  within the ethmoid and sphenoid sinus. Air-fluid levels are noted within the maxillary antra bilaterally consistent with a more acute degree of sinusitis. Lacunar infarct is noted within the left basal ganglia and left thalamus extending into the deep white matter new from the prior exam. No findings to suggest acute hemorrhage are noted. IMPRESSION: New areas of subacute to chronic ischemia on the left. Progression in the degree of sinusitis as described. Electronically Signed   By: Inez Catalina M.D.   On: 06/17/2016 13:06   Mr Jeri Cos EG Contrast  Result Date: 06/21/2016 CLINICAL DATA:  Initial evaluation for recurrent altered mental status, seizures, history of strep pneumo meningitis. Question new areas of and within the left basal ganglia. EXAM: MRI HEAD WITHOUT AND WITH CONTRAST TECHNIQUE: Multiplanar, multiecho pulse sequences of the brain and surrounding structures were obtained without and with intravenous contrast. CONTRAST:  71m MULTIHANCE GADOBENATE DIMEGLUMINE 529 MG/ML IV SOLN COMPARISON:  Comparison made with prior CT from 06/17/2016 as well as previous MRI from 03/24/2016. FINDINGS: Diffuse prominence of the CSF containing spaces compatible with generalized cerebral atrophy. There has been normal interval expected evolution of previously identified acute ischemic infarct involving the left corona radiata/basal ganglia, which now demonstrates progressive encephalomalacia without significant diffusion abnormality. Question minimal faint enhancement within this region extending towards the left cerebral peduncle, likely related to the late subacute ischemia (series 11, image 28). Additional remote infarctions involving the bilateral basal ganglia again seen. Chronic blood products again noted within the left external capsule. No new areas of restricted diffusion to suggest new ischemia or injury. Similar FLAIR signal abnormality at the right parietal cortex. Associated subtle laminar necrosis  within this region (series 3, image 6). No significant enhancement now seen within this region. Additional focus of cortical flares abnormality within the right frontal lobe slightly more prominent as compared to previous exam, although this appears to be chronic in nature. No appreciable enhancement within these areas. No other abnormal enhancement elsewhere within the brain. Major intracranial vascular flow voids are preserved. No mass lesion, mass effect, or midline shift. Layering susceptibility artifact within the stent was of both lateral ventricles, likely debris/chronic hemorrhage related to prior meningitis, similar to prior. No hydrocephalus. Craniocervical junction within normal limits. Visualized upper cervical spine unremarkable. Pituitary gland normal. No acute abnormality about the globes and orbits. Extensive opacity throughout the paranasal sinuses with fluid levels within the sphenoid and maxillary sinuses. Bilateral mastoid effusions. Fluid within the nasopharynx. Patient likely intubated. Bone marrow signal intensity within normal limits. No scalp soft tissue abnormality. IMPRESSION: 1. Normal expected interval evolution of previously identified infarct involving the left basal ganglia/corona radiata. Additionally, previously seen signal abnormalities within the bilateral basal ganglia have also largely resolved relative to previous MRI from 03/24/2016. Similarly, changes at the right parietal cortex have also evolved. No new areas of ischemia or injury identified. 2. Persistent layering  debris/hemorrhage within the lateral ventricles, likely related to recent history of meningitis. No hydrocephalus. Electronically Signed   By: Jeannine Boga M.D.   On: 06/21/2016 04:55   Dg Chest Port 1 View  Result Date: 07/05/2016 CLINICAL DATA:  Altered mental status EXAM: PORTABLE CHEST 1 VIEW COMPARISON:  06/30/2016 FINDINGS: Stable hazy appearance of the bases which is likely combination of  atelectasis and pleural fluid. No edema, air bronchograms, or pneumothorax. Normal heart size and mediastinal contours. IMPRESSION: Stable appearance of the chest. Hazy basilar opacities again favor small effusions and atelectasis. Electronically Signed   By: Monte Fantasia M.D.   On: 07/05/2016 09:19   Dg Chest Port 1 View  Result Date: 06/30/2016 CLINICAL DATA:  Altered mental status.  Low body temperature. EXAM: PORTABLE CHEST 1 VIEW COMPARISON:  06/25/2016 FINDINGS: Lungs are adequately inflated as patient is slightly rotated to the right. Interval resolution of left base opacification with improvement but mild residual hazy opacification in the right base possibly due to small layering effusions/atelectasis, although cannot exclude infection cardiomediastinal silhouette and remainder of the exam is unchanged. IMPRESSION: Interval improvement with mild residual hazy opacification in the right base likely small layering effusions/atelectasis although cannot exclude infection. Electronically Signed   By: Marin Olp M.D.   On: 06/30/2016 08:56  Dg Chest Port 1 View  Result Date: 06/25/2016 CLINICAL DATA:  Pneumonia. EXAM: PORTABLE CHEST 1 VIEW COMPARISON:  06/22/2016. FINDINGS: Interval extubation. Left IJ central line tip projects over the SVC. Heart size normal. Left lower lobe collapse/ consolidation. Hazy opacification over the lower hemithoraces bilaterally, new on the right. IMPRESSION: 1. Left lower lobe collapse/consolidation. 2. Hazy opacification over both lower hemithoraces may be due to layering pleural fluid and/or true airspace opacification. Electronically Signed   By: Lorin Picket M.D.   On: 06/25/2016 10:13   Dg Chest Port 1 View  Result Date: 06/22/2016 CLINICAL DATA:  Respiratory failure, acute encephalopathy, cardiogenic shock, acute and chronic CHF, current smoker. EXAM: PORTABLE CHEST 1 VIEW COMPARISON:  Portable chest x-ray of June 19, 2016 FINDINGS: The right lung is  adequately inflated and clear. On the left there is persistent basilar atelectasis or pneumonia with small pleural effusion. The heart is normal in size. The pulmonary vascularity is not engorged. The endotracheal tube tip lies 5.3 cm above the carina. The esophagogastric tube tip projects below the inferior margin of the image. The left internal jugular venous catheter tip projects over the proximal third of the SVC. IMPRESSION: Persistent left lower lobe atelectasis or pneumonia with trace pleural effusion. No CHF. The support tubes are in stable position. Electronically Signed   By: David  Martinique M.D.   On: 06/22/2016 07:20   Dg Chest Portable 1 View  Result Date: 06/19/2016 CLINICAL DATA:  Respiratory failure EXAM: PORTABLE CHEST 1 VIEW COMPARISON:  06/18/2016 FINDINGS: ET tube tip is above the carina. There is a left IJ catheter with tip in the projection of the SVC. Enteric tube tip is below the hemidiaphragm. Persistent opacity within the left lung base. Right lung is clear. IMPRESSION: 1. Stable position of the support apparatus. 2. No change in left base opacity. Electronically Signed   By: Kerby Moors M.D.   On: 06/19/2016 07:44   Dg Chest Port 1 View  Result Date: 06/18/2016 CLINICAL DATA:  50 year old male with a history of respiratory failure EXAM: PORTABLE CHEST 1 VIEW COMPARISON:  06/17/2016, 06/16/2016 FINDINGS: Cardiomediastinal silhouette unchanged in size and contour. Endotracheal tube unchanged, terminating 3.8 cm  above the carina. Unchanged left IJ approach central venous catheter, appearing to terminate superior vena cava. Gastric tube projects over the mediastinum, terminating out of the field of view. No visualized pneumothorax. Improved aeration in the left mid lung, with persistent opacity in the retrocardiac region. Blunting of left costophrenic angle. IMPRESSION: Improved aeration of the left lung, with persistent left basilar opacity, potentially consolidation, edema, and/ or  atelectasis. Small left pleural effusion not excluded. Unchanged position of endotracheal tube, gastric tube, left IJ central venous catheter. Signed, Dulcy Fanny. Earleen Newport, DO [facility] Electronically Signed   By: Corrie Mckusick D.O.   On: 06/18/2016 10:15   Dg Chest Port 1 View  Result Date: 06/17/2016 CLINICAL DATA:  50 year old male with enteric tube placement. EXAM: PORTABLE CHEST 1 VIEW COMPARISON:  A abdominal radiograph dated 06/16/2016 FINDINGS: An endotracheal tube is noted with tip approximately 3 cm above the carina. Single portable view of the chest demonstrates patchy area of hazy airspace opacity in the left mid to lower lung field. There is no pleural effusion or pneumothorax. The cardiac silhouette is within normal limits. Left IJ central line with tip over central SVC. An enteric tube extends into the epigastric area with tip superimposed over the right L3 pedicle likely in the distal stomach. There is no bowel dilatation or evidence of obstruction. No free air. No radiopaque calculi or foreign object. The soft tissues and osseous structures appear unremarkable. The previously seen left femoral central venous catheter has been removed. A temperature probe is partially visualized over the bladder. IMPRESSION: Endotracheal tube above the carina. Patchy area of airspace density in the left mid to lower lung field. Follow-up recommended. Enteric tube in the distal stomach.  No bowel obstruction. Electronically Signed   By: Anner Crete M.D.   On: 06/17/2016 02:48   Dg Chest Port 1 View  Result Date: 06/16/2016 CLINICAL DATA:  Endotracheal tube placement EXAM: PORTABLE CHEST 1 VIEW COMPARISON:  06/16/2016 FINDINGS: There is an endotracheal tube with the tip 3.7 cm above the carina. Nasogastric tube coursing below the diaphragm. There is hazy left lower lobe airspace disease. There is no pleural effusion or pneumothorax. The heart and  mediastinal contours are unremarkable. The osseous structures are unremarkable. IMPRESSION: 1. Hazy left lower lobe airspace disease concerning for pneumonia. 2. Endotracheal tube with the tip 3.7 cm above the carina. Electronically Signed   By: Kathreen Devoid   On: 06/16/2016 22:41   Dg Chest Port 1 View  Result Date: 06/16/2016 CLINICAL DATA:  Intubation.  Followup pneumonia. EXAM: PORTABLE CHEST 1 VIEW 8:24 p.m.: COMPARISON:  Portable chest x-ray earlier same date 5:49 p.m. and previously. FINDINGS: Endotracheal tube tip in satisfactory position projecting approximately 6 cm above the carina. Nasogastric tube courses below the diaphragm into the stomach though its tip is not included on the image. Consolidation in the left lower lobe as noted earlier. Developing patchy opacities at the right lung base. IMPRESSION: 1. Support apparatus satisfactory. Endotracheal tube tip approximately 6 cm above the carina. Nasogastric tube courses below the diaphragm into the stomach. 2. Left lower lobe pneumonia as noted earlier. Developing atelectasis and/or pneumonia involving the right lung base. Electronically Signed   By: Evangeline Dakin M.D.   On: 06/16/2016 20:49   Dg Chest Port 1 View  Result Date: 06/16/2016 CLINICAL DATA:  50 year old with possible aspiration and sepsis. EXAM: PORTABLE CHEST 1 VIEW COMPARISON:  05/29/2016, 05/27/2016 and earlier. FINDINGS: Patient is rotated to  the left. Cardiomediastinal silhouette unremarkable, unchanged. Airspace consolidation in the retrocardiac left lung base. Lungs otherwise clear. Pulmonary vascularity normal. No visible pleural effusions. IMPRESSION: Left lower lobe pneumonia. Electronically Signed   By: Evangeline Dakin M.D.   On: 06/16/2016 18:22   Dg Abd Portable 1v  Result Date: 06/17/2016 CLINICAL DATA:  50 year old male with enteric tube placement. EXAM: PORTABLE CHEST 1 VIEW COMPARISON:  A abdominal radiograph dated 06/16/2016 FINDINGS: An endotracheal tube is  noted with tip approximately 3 cm above the carina. Single portable view of the chest demonstrates patchy area of hazy airspace opacity in the left mid to lower lung field. There is no pleural effusion or pneumothorax. The cardiac silhouette is within normal limits. Left IJ central line with tip over central SVC. An enteric tube extends into the epigastric area with tip superimposed over the right L3 pedicle likely in the distal stomach. There is no bowel dilatation or evidence of obstruction. No free air. No radiopaque calculi or foreign object. The soft tissues and osseous structures appear unremarkable. The previously seen left femoral central venous catheter has been removed. A temperature probe is partially visualized over the bladder. IMPRESSION: Endotracheal tube above the carina. Patchy area of airspace density in the left mid to lower lung field. Follow-up recommended. Enteric tube in the distal stomach.  No bowel obstruction. Electronically Signed   By: Anner Crete M.D.   On: 06/17/2016 02:48   Dg Swallowing Func-speech Pathology  Result Date: 06/29/2016 Objective Swallowing Evaluation: Type of Study: MBS-Modified Barium Swallow Study Patient Details Name: MAKYI LEDO MRN: [id number] Date of Birth: 1966-02-05 Today's Date: 06/29/2016 Time: SLP Start Time (ACUTE ONLY): 1215-SLP Stop Time (ACUTE ONLY): 1235 SLP Time Calculation (min) (ACUTE ONLY): 20 min Past Medical History: Past Medical History: Diagnosis Date . Acute encephalopathy  . Acute ischemic stroke ([place])  . Acute renal failure ([place])  . Acute respiratory failure with hypoxia ([place])  . AKI (acute kidney injury) ([place]) 02/16/2016 . Altered mental status  . Cardiac arrest ([place])  . Cardiogenic shock ([place])  . 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 Cerebral thrombosis with cerebral infarction 03/25/2016 . CHF (congestive heart failure) ([place])  . Convulsions/seizures ([place]) 05/04/2016  Seizure disorder - Continue Keppra, Depakote.    . Dementia 04/27/2016 .  Depression  . Dysphagia 05/04/2016  Dysphagia - Dysphagia 1 diet per SLP    . ETOH abuse  . Hydrocephalus  . Hyperlipidemia 05/04/2016  Dyslipidemia - Continue statin.    Marland Kitchen Hypernatremia  . Hypothermia 03/20/2016 . Low serum cortisol level ([facility]) 05/04/2016  Low a.m. cortisol  - Was 1.2 checked on 04/03/2016  - This likely is reflective of cortisol suppression from steroid use - Tapered steroids off  . Meningitis, pneumococcal, recurrent  . Meningitis, streptococcal 02/16/2016 . Meningoencephalitis  . NSTEMI (non-ST elevated myocardial infarction) ([place]) 03/14/2016 . Protein-calorie malnutrition, severe ([place])  . Respiratory failure ([place]) 03/03/2016 . Septic shock ([place])  . Severe sepsis ([place])  . 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 Substance abuse  . Systolic and diastolic CHF, chronic ([place])  . Tobacco abuse 02/16/2016 . Trichomonal urethritis in male 02/16/2016 . Urinary retention 05/04/2016  Urinary retention - Continue Flomax    Past Surgical History: Past Surgical History: Procedure Laterality Date . ANKLE SURGERY   . CARDIAC CATHETERIZATION N/A 02/17/2016  Procedure: IABP Insertion;  Surgeon: Wellington Hampshire, MD;  Location: [facility];  Service: Cardiovascular;  Laterality: N/A; . 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  Re-admitted 7/13 again presenting with AMS thought to be septic/PNA in origin but had witnessed seizure in ED.  SLP evaluations during previous admissions have recommended Dys 1 diet and nectar thick liquids with advancement to Dys 2 textures prior to discharge.  Intubated  7/13-21.  Subjective: Pt kept eyes closed during MBS. Appeared lethargic, but accepted po presentations Assessment / Plan / Recommendation CHL IP CLINICAL IMPRESSIONS 06/29/2016 Therapy Diagnosis Mild oral phase dysphagia;Moderate pharyngeal phase dysphagia Clinical Impression Pt presents with mild oral, moderate pharyngeal phase dysphagia, characterized by poor bolus formation and posterior propulsion, resulting in posterior spillage to the vallecula on nectar, honey, and puree consistencies, and at the pyriform sinus on thin liquids. Silent aspiration was noted during the swallow of thin liquids. No penetration  or aspiration was observed on nectar, honey, or puree consistencies, however, risk of episodic aspiration of all consistencies is possible, given poor oral prep and delayed swallow reflex. Solid consistencies were not provided at this time, due to extended ineffective oral bolus formation and propulsion and associated risk of aspiration with fatigue.  Recommend Puree diet and nectar thick liquids with full supervision. Pt needs to be monitored closely during meals to insure oral cavity is clear before provided the next bolus. Safe swallow precautions were sent back to room with pt. Impact on safety and function Moderate aspiration risk   CHL IP TREATMENT RECOMMENDATION 06/29/2016 Treatment Recommendations Therapy as outlined in treatment plan below   Prognosis 06/29/2016 Prognosis for Safe Diet Advancement Fair Barriers to Reach Goals Cognitive deficits CHL IP DIET RECOMMENDATION 06/29/2016 SLP Diet Recommendations Dysphagia 1 (Puree) solids;Nectar thick liquid Liquid Administration via Straw;Cup Medication Administration Crushed with puree Compensations Slow rate;Small sips/bites;Minimize environmental distractions Postural Changes Remain semi-upright after after feeds/meals (Comment);Seated upright at 90 degrees   CHL IP OTHER RECOMMENDATIONS 06/29/2016 Recommended Consults -- Oral Care Recommendations Oral  care BID Other Recommendations Order thickener from pharmacy;Prohibited food (jello, ice cream, thin soups);Remove water pitcher   CHL IP FOLLOW UP RECOMMENDATIONS 06/29/2016 Follow up Recommendations Skilled Nursing facility   [facility] IP FREQUENCY AND DURATION 06/29/2016 Speech Therapy Frequency (ACUTE ONLY) min 2x/week Treatment Duration 2 weeks    CHL IP ORAL PHASE 06/29/2016 Oral Phase Impaired Oral - Honey Teaspoon Premature spillage;Reduced posterior propulsion Oral - Nectar Teaspoon Premature spillage;Reduced posterior propulsion;Delayed oral transit Oral - Nectar Straw Premature spillage;Reduced posterior propulsion;Delayed oral transit Oral - Thin Straw Premature spillage;Reduced posterior propulsion;Delayed oral transit Oral - Puree Premature spillage;Delayed oral transit;Reduced posterior propulsion  CHL IP CERVICAL ESOPHAGEAL PHASE 06/29/2016 Cervical Esophageal Phase [facility] Shonna Chock 06/29/2016, 1:27 PM Celia B. Bueche, [facility], CCC-SLP [phone]               Time Spent in minutes  3   Taesha Goodell M.D on 07/06/2016 at 12:11 PM  Between 7am to 7pm - Pager - [phone]  After 7pm go to [email or web address] - password [facility]  [facility] -  Office  [phone]

## 2016-07-06 NOTE — Progress Notes (Signed)
Dr Susie Cassette aware of the fall the patient is still had attempted several times to get oob inspite of  Bed alarm on and verbal commands . Marland Kitchen Pt placed with safety sitter

## 2016-07-06 NOTE — Progress Notes (Signed)
Patient rectal temp noted to be 93 degrees. Warmed blankets applied. Charge RN made aware. Orders for Warming blanket remains active, materials management notified to send to 3E. Text page sent to Floor Coverage MD for FYI.

## 2016-07-06 NOTE — Progress Notes (Signed)
1510 pt monitored in camera bed and alarm bed . But still able to get oob    quickly  But  Wobbly on legs . Marland Kitchen No overt injury noted . Pt denied pain. Placed back to bed . Vs taken and charted . Dr Susie Cassette made aware. With order

## 2016-07-06 NOTE — Progress Notes (Signed)
Physical Therapy Treatment Patient Details Name: Dale [name] MRN: [id number] DOB: [birth year] Today's Date: 07/06/2016    History of Present Illness Patient is a 50 y/o male with hx of pneumonia meningitis, cardiac arrest, dementia, NSTEMI, seizures, CHF, HLD, and cerebral septic emboli with multiple recent admissions (one every month starting in March) and in and out of a SNF presents from [place] with AMS. Found to be in septic shock due to HCAP and seizures. Intubated on arrival 7/13-7/21. Rapid response called due to hypotension and hypothermia, transferred to step down 7/27.    PT Comments    Mr. Friday presents with Rt neglect and Bil UE apraxia and is minimally verbal.  He agreed to participate with therapy and required +2 mod assist for bed mobility and +2 max assist for sit<>stand and stand pivot transfers.  SNF remains most appropriate d/c plan at this time.   Follow Up Recommendations  SNF;Supervision/Assistance - 24 hour     Equipment Recommendations  None recommended by PT    Recommendations for Other Services       Precautions / Restrictions Precautions Precautions: Fall Restrictions Weight Bearing Restrictions: No    Mobility  Bed Mobility Overal bed mobility: +2 for physical assistance;Needs Assistance Bed Mobility: Supine to Sit     Supine to sit: Mod assist;+2 for physical assistance     General bed mobility comments: With multimodal cues pt initiates supine>sit by coming to long sitting.  +2 mod assist to elevate trunk and to advance LEs to EOB.  Transfers Overall transfer level: Needs assistance Equipment used: 2 person hand held assist Transfers: Sit to/from [employer] Sit to Stand: Max assist;+2 physical assistance Stand pivot transfers: Max assist;+2 physical assistance       General transfer comment: Assist to boost to standing and to steady.  Pt unable to shuffle Rt LE despite multimodal cues, pt initiates pivoting Lt LE  but requires assist to pivot to chair and sit.  Ambulation/Gait             General Gait Details: unable to attempt at this time   Stairs            Wheelchair Mobility    Modified Rankin (Stroke Patients Only)       Balance Overall balance assessment: Needs assistance Sitting-balance support: Bilateral upper extremity supported;Feet supported Sitting balance-Leahy Scale: Poor Sitting balance - Comments: Pt requires close min guard>mod assist for balance sitting EOB.     Standing balance support: Bilateral upper extremity supported;During functional activity Standing balance-Leahy Scale: Zero                      Cognition Arousal/Alertness: Awake/alert Behavior During Therapy: Flat affect Overall Cognitive Status: No family/caregiver present to determine baseline cognitive functioning                      Exercises General Exercises - Upper Extremity Shoulder Flexion: AROM;Both;5 reps;Seated;Limitations Shoulder Flexion Limitations: Rt limited to ~60 deg General Exercises - Lower Extremity Ankle Circles/Pumps: PROM;Both;10 reps;Seated Long Arc Quad: AROM;Both;Seated;5 reps    General Comments General comments (skin integrity, edema, etc.): Rt neglect and only looks to Rt when prompted to find the window in the room and tell PT what he sees.  Poor tracking to the Rt.      Pertinent Vitals/Pain Pain Assessment: Faces Faces Pain Scale: No hurt Pain Intervention(s): Monitored during session    Home Living  Prior Function            PT Goals (current goals can now be found in the care plan section) Acute Rehab PT Goals Patient Stated Goal: unable to state PT Goal Formulation: Patient unable to participate in goal setting Time For Goal Achievement: 07/09/16 Potential to Achieve Goals: Fair Progress towards PT goals: Not progressing toward goals - comment (due to fatigue)    Frequency  Min 2X/week     PT Plan Current plan remains appropriate    Co-evaluation             End of Session Equipment Utilized During Treatment: Gait belt Activity Tolerance: Patient limited by fatigue Patient left: in chair;with call bell/phone within reach;with chair alarm set     Time: 1610-9604 PT Time Calculation (min) (ACUTE ONLY): 22 min  Charges:  $Therapeutic Activity: 8-22 mins                    G Codes:       Encarnacion Chu PT, DPT  Pager: [phone] Phone: [phone] 07/06/2016, 2:06 PM

## 2016-07-06 NOTE — Consult Note (Signed)
Reason for Consult: Evaluation management of adrenal insufficiency  Referring Physician: Deo Mehringer is an 50 y.o. male.   HPI: The patient was admitted on 7/13 with hypotension, probable sepsis and also had a seizure in the emergency room  Because of his problems with recurrent hypotension despite multiple medical problems it was felt that his cortisol level done at 9 PM was low normal at 4.3 It was assumed that he had adrenal insufficiency and was started on intravenous hydrocortisone without a Cortrosyn stimulation test Patient is very somnolent and no history of a level, he lives in the nursing home and not clear if he is previously had problems with weight loss, nausea and vomiting or decreased appetite During a previous hospitalization his cortisol level was 1.2 but he has never been given steroid supplementation  Apparently when his intravenous hydrocortisone of 50 mg every 8 hours is changed to oral hydrocortisone 40 mg 3 times a day his blood pressure tends to drop and he gets more somnolent along with the drop in his temperature but he tends recovers with restarting the IV hydrocortisone Currently is back on hydrocortisone IV for the last 2 days   BP Readings from Last 3 Encounters:  07/06/16 (!) 98/57  06/01/16 91/64  05/09/16 118/64     Lab Results  Component Value Date   CREATININE 0.76 07/06/2016   BUN 9 07/06/2016   NA 146 (H) 07/06/2016   K 3.8 07/06/2016   CL 107 07/06/2016   CO2 29 07/06/2016     Past Medical History:  Diagnosis Date  . Acute encephalopathy   . Acute ischemic stroke ([place])   . Acute renal failure ([place])   . Acute respiratory failure with hypoxia ([place])   . AKI (acute kidney injury) ([place]) 02/16/2016  . Altered mental status   . Cardiac arrest ([facility])   . Cardiogenic shock ([place])   . Cerebral septic emboli ([place])   . Cerebral thrombosis with cerebral infarction 03/25/2016  . CHF (congestive heart failure) ([place])   .  Convulsions/seizures ([place]) 05/04/2016   Seizure disorder - Continue Keppra, Depakote.     . Dementia 04/27/2016  . Depression   . Dysphagia 05/04/2016   Dysphagia - Dysphagia 1 diet per SLP     . ETOH abuse   . Hydrocephalus   . Hyperlipidemia 05/04/2016   Dyslipidemia - Continue statin.     Marland Kitchen Hypernatremia   . Hypothermia 03/20/2016  . Low serum cortisol level ([facility]) 05/04/2016   Low a.m. cortisol  - Was 1.2 checked on 04/03/2016  - This likely is reflective of cortisol suppression from steroid use - Tapered steroids off   . Meningitis, pneumococcal, recurrent   . Meningitis, streptococcal 02/16/2016  . Meningoencephalitis   . NSTEMI (non-ST elevated myocardial infarction) ([place]) 03/14/2016  . Protein-calorie malnutrition, severe ([place])   . Respiratory failure ([place]) 03/03/2016  . Septic shock ([place])   . Severe sepsis ([place])   . 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 Substance abuse   . Systolic and diastolic CHF, chronic ([place])   . Tobacco abuse 02/16/2016  . 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 Urinary retention 05/04/2016   Urinary retention - Continue Flomax       Past Surgical History:  Procedure Laterality Date  . ANKLE SURGERY    . CARDIAC CATHETERIZATION N/A 02/17/2016   Procedure: IABP Insertion;  Surgeon: Wellington Hampshire, MD;  Location: [facility];  Service: Cardiovascular;  Laterality: N/A;  . TEE  WITHOUT CARDIOVERSION N/A 03/29/2016   Procedure: TRANSESOPHAGEAL ECHOCARDIOGRAM (TEE);  Surgeon: Larey Dresser, MD;  Location: [facility];  Service: Cardiovascular;  Laterality: N/A;    History reviewed. No pertinent family history.  Social History:  reports that he has been smoking Cigarettes.  He has been smoking about 0.10 packs per day. He does not have any smokeless tobacco history on file. He reports that he uses drugs, including Marijuana. He reports that he does not drink alcohol.  Allergies: No Known Allergies  Medications:  Current Facility-Administered  Medications:  .  aspirin tablet 325 mg, 325 mg, Per Tube, Daily, Rahul P Desai, PA-C, 325 mg at 07/06/16 1029 .  atorvastatin (LIPITOR) tablet 20 mg, 20 mg, Oral, q1800, Erick Colace, NP, 20 mg at 07/04/16 1659 .  bethanechol (URECHOLINE) tablet 10 mg, 10 mg, Oral, Q8H, Erick Colace, NP, 10 mg at 07/06/16 1559 .  chlorhexidine gluconate (SAGE KIT) (PERIDEX) 0.12 % solution 15 mL, 15 mL, Mouth Rinse, BID, Brand Males, MD, 15 mL at 07/06/16 0800 .  dextrose 10 % infusion, , Intravenous, Continuous, Reyne Dumas, MD, Last Rate: 50 mL/hr at 07/06/16 1548 .  divalproex (DEPAKOTE) DR tablet 750 mg, 750 mg, Oral, Q8H, Jani Gravel, MD, 750 mg at 07/06/16 1556 .  enoxaparin (LOVENOX) injection 40 mg, 40 mg, Subcutaneous, Q24H, Jani Gravel, MD, 40 mg at 07/06/16 1600 .  fludrocortisone (FLORINEF) tablet 0.1 mg, 0.1 mg, Oral, Daily, Elayne Snare, MD .  folic acid (FOLVITE) tablet 1 mg, 1 mg, Oral, Daily, Erick Colace, NP, 1 mg at 07/06/16 1031 .  hydrocortisone (CORTEF) tablet 80 mg, 80 mg, Oral, [id number], Elayne Snare, MD, 80 mg at 07/06/16 1557 .  insulin aspart (novoLOG) injection 0-15 Units, 0-15 Units, Subcutaneous, TID WC, Collene Gobble, MD .  insulin aspart (novoLOG) injection 0-5 Units, 0-5 Units, Subcutaneous, QHS, Collene Gobble, MD .  levETIRAcetam (KEPPRA) tablet 1,500 mg, 1,500 mg, Oral, BID, Jani Gravel, MD, 1,500 mg at 07/06/16 1030 .  LORazepam (ATIVAN) injection 0.5 mg, 0.5 mg, Intravenous, Q6H PRN, Jani Gravel, MD, 0.5 mg at 07/05/16 2105 .  RESOURCE THICKENUP CLEAR, , Oral, PRN, Reyne Dumas, MD .  sodium chloride 0.9 % bolus 500 mL, 500 mL, Intravenous, Q6H PRN, Reyne Dumas, MD, 500 mL at 07/02/16 1208 .  thiamine (VITAMIN B-1) tablet 100 mg, 100 mg, Oral, Daily, Chesley Mires, MD, 100 mg at 07/06/16 1030  Results for orders placed or performed during the hospital encounter of 06/16/16 (from the past 48 hour(s))  Glucose, capillary     Status: None   Collection Time: 07/04/16  3:53  PM  Result Value Ref Range   Glucose-Capillary 69 65 - 99 mg/dL  Glucose, capillary     Status: None   Collection Time: 07/04/16  5:39 PM  Result Value Ref Range   Glucose-Capillary 69 65 - 99 mg/dL  Glucose, capillary     Status: None   Collection Time: 07/04/16  6:31 PM  Result Value Ref Range   Glucose-Capillary 72 65 - 99 mg/dL  Glucose, capillary     Status: Abnormal   Collection Time: 07/04/16 10:06 PM  Result Value Ref Range   Glucose-Capillary 39 (LL) 65 - 99 mg/dL   Comment 1 Notify RN    Comment 2 Document in Chart   Glucose, capillary     Status: Abnormal   Collection Time: 07/04/16 10:43 PM  Result Value Ref Range   Glucose-Capillary 123 (H) 65 - 99  mg/dL   Comment 1 Notify RN    Comment 2 Document in Chart   Glucose, capillary     Status: None   Collection Time: 07/05/16  2:53 AM  Result Value Ref Range   Glucose-Capillary 86 65 - 99 mg/dL   Comment 1 Notify RN    Comment 2 Document in Chart   Glucose, capillary     Status: None   Collection Time: 07/05/16  5:51 AM  Result Value Ref Range   Glucose-Capillary 78 65 - 99 mg/dL  Glucose, capillary     Status: None   Collection Time: 07/05/16 12:24 PM  Result Value Ref Range   Glucose-Capillary 77 65 - 99 mg/dL  Blood gas, arterial     Status: Abnormal   Collection Time: 07/05/16  4:36 PM  Result Value Ref Range   FIO2 0.21    pH, Arterial 7.473 (H) 7.350 - 7.450   pCO2 arterial 45.5 (H) 35.0 - 45.0 mmHg   pO2, Arterial 41.7 (L) 80.0 - 100.0 mmHg   Bicarbonate 33.0 (H) 20.0 - 24.0 mEq/L   TCO2 34.3 0 - 100 mmol/L   Acid-Base Excess 8.9 (H) 0.0 - 2.0 mmol/L   O2 Saturation 75.6 %   Patient temperature 98.6    Drawn by [id number]    Sample type ARTERIAL DRAW    Allens test (pass/fail) PASS PASS  Glucose, capillary     Status: None   Collection Time: 07/05/16  4:48 PM  Result Value Ref Range   Glucose-Capillary 65 65 - 99 mg/dL  Glucose, capillary     Status: None   Collection Time: 07/05/16  5:55 PM   Result Value Ref Range   Glucose-Capillary 84 65 - 99 mg/dL  Glucose, capillary     Status: None   Collection Time: 07/05/16  8:49 PM  Result Value Ref Range   Glucose-Capillary 88 65 - 99 mg/dL  Comprehensive metabolic panel     Status: Abnormal   Collection Time: 07/06/16  3:22 AM  Result Value Ref Range   Sodium 146 (H) 135 - 145 mmol/L   Potassium 3.8 3.5 - 5.1 mmol/L   Chloride 107 101 - 111 mmol/L   CO2 29 22 - 32 mmol/L   Glucose, Bld 79 65 - 99 mg/dL   BUN 9 6 - 20 mg/dL   Creatinine, Ser 0.76 0.61 - 1.24 mg/dL   Calcium 8.3 (L) 8.9 - 10.3 mg/dL   Total Protein 4.7 (L) 6.5 - 8.1 g/dL   Albumin 2.0 (L) 3.5 - 5.0 g/dL   AST 13 (L) 15 - 41 U/L   ALT 12 (L) 17 - 63 U/L   Alkaline Phosphatase 48 38 - 126 U/L   Total Bilirubin 0.2 (L) 0.3 - 1.2 mg/dL   GFR calc non Af Amer >60 >60 mL/min   GFR calc Af Amer >60 >60 mL/min    Comment: (NOTE) The eGFR has been calculated using the CKD EPI equation. This calculation has not been validated in all clinical situations. eGFR's persistently <60 mL/min signify possible Chronic Kidney Disease.    Anion gap 10 5 - 15  CBC     Status: Abnormal   Collection Time: 07/06/16  3:22 AM  Result Value Ref Range   WBC 8.2 4.0 - 10.5 K/uL   RBC 3.26 (L) 4.22 - 5.81 MIL/uL   Hemoglobin 9.3 (L) 13.0 - 17.0 g/dL   HCT 29.4 (L) 39.0 - 52.0 %   MCV 90.2 78.0 - 100.0 fL  MCH 28.5 26.0 - 34.0 pg   MCHC 31.6 30.0 - 36.0 g/dL   RDW 17.9 (H) 11.5 - 15.5 %   Platelets 247 150 - 400 K/uL  Glucose, capillary     Status: None   Collection Time: 07/06/16  5:53 AM  Result Value Ref Range   Glucose-Capillary 95 65 - 99 mg/dL  Glucose, capillary     Status: Abnormal   Collection Time: 07/06/16 12:16 PM  Result Value Ref Range   Glucose-Capillary 106 (H) 65 - 99 mg/dL   Comment 1 Notify RN    Comment 2 Document in Chart     Dg Chest Port 1 View  Result Date: 07/05/2016 CLINICAL DATA:  Altered mental status EXAM: PORTABLE CHEST 1 VIEW COMPARISON:   06/30/2016 FINDINGS: Stable hazy appearance of the bases which is likely combination of atelectasis and pleural fluid. No edema, air bronchograms, or pneumothorax. Normal heart size and mediastinal contours. IMPRESSION: Stable appearance of the chest. Hazy basilar opacities again favor small effusions and atelectasis. Electronically Signed   By: Monte Fantasia M.D.   On: 07/05/2016 09:19    Review of Systems  Constitutional: Negative for fever.  Respiratory: Negative for shortness of breath.   Cardiovascular: Negative for leg swelling.  Gastrointestinal: Negative for diarrhea, nausea and vomiting.  Musculoskeletal: Negative for joint pain.  Skin: Negative for rash.  Neurological: Positive for sensory change.   Blood pressure (!) 98/57, pulse (!) 47, temperature 98 F (36.7 C), temperature source Axillary, resp. rate 18, height 5' 8" (1.727 m), weight 144 lb 3.2 oz (65.4 kg), SpO2 96 %. Physical Exam  Constitutional:  He is somewhat asthenic-looking  HENT:  Mouth/Throat: Oropharynx is clear and moist.  Tongue appears coated  Neck: No thyromegaly present.  Cardiovascular: Regular rhythm and normal heart sounds.  Exam reveals no gallop.   No murmur heard. Respiratory: Breath sounds normal. He has no wheezes. He has no rales.  GI: Soft. He exhibits no distension and no mass. There is no tenderness.  Musculoskeletal: He exhibits no edema or tenderness.  Lymphadenopathy:    He has no cervical adenopathy.  Neurological: He displays normal reflexes. No cranial nerve deficit.  He follows simple commands but is not conversant, relatively somnolent  Psychiatric: His behavior is normal.    Assessment/Plan: History of recurrent hypotension associated with low  Normal cortisol level His blood pressure tends to be low normal and not clear if he truly has adrenal insufficiency or if he tends to react to lowering his hydrocortisone dose rapidly.  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  Recommendations for now would be to try 80 mg of oral hydrocortisone 3 times a day and then taper very gradually If he is unable to take a dose orally he will need to have the increased dose done at that time  [facility] 07/06/2016, 12:55 PM

## 2016-07-07 LAB — COMPREHENSIVE METABOLIC PANEL
ALT: 14 U/L — AB (ref 17–63)
AST: 13 U/L — AB (ref 15–41)
Albumin: 2.2 g/dL — ABNORMAL LOW (ref 3.5–5.0)
Alkaline Phosphatase: 50 U/L (ref 38–126)
Anion gap: 7 (ref 5–15)
BUN: 9 mg/dL (ref 6–20)
CHLORIDE: 105 mmol/L (ref 101–111)
CO2: 32 mmol/L (ref 22–32)
CREATININE: 0.76 mg/dL (ref 0.61–1.24)
Calcium: 8.3 mg/dL — ABNORMAL LOW (ref 8.9–10.3)
GFR calc Af Amer: >60 mL/min (ref >60)
GFR calc non Af Amer: >60 mL/min (ref >60)
Glucose, Bld: 105 mg/dL — ABNORMAL HIGH (ref 65–99)
Potassium: 3.1 mmol/L — ABNORMAL LOW (ref 3.5–5.1)
SODIUM: 144 mmol/L (ref 135–145)
Total Bilirubin: 0.1 mg/dL — ABNORMAL LOW (ref 0.3–1.2)
Total Protein: 5.1 g/dL — ABNORMAL LOW (ref 6.5–8.1)

## 2016-07-07 LAB — GLUCOSE, CAPILLARY
GLUCOSE-CAPILLARY: 106 mg/dL — AB (ref 65–99)
GLUCOSE-CAPILLARY: 108 mg/dL — AB (ref 65–99)
GLUCOSE-CAPILLARY: 108 mg/dL — AB (ref 65–99)
GLUCOSE-CAPILLARY: 120 mg/dL — AB (ref 65–99)

## 2016-07-07 LAB — CBC
HEMATOCRIT: 28.9 % — AB (ref 39.0–52.0)
Hemoglobin: 9.1 g/dL — ABNORMAL LOW (ref 13.0–17.0)
MCH: 28 pg (ref 26.0–34.0)
MCHC: 31.5 g/dL (ref 30.0–36.0)
MCV: 88.9 fL (ref 78.0–100.0)
PLATELETS: 214 10*3/uL (ref 150–400)
RBC: 3.25 MIL/uL — AB (ref 4.22–5.81)
RDW: 17.4 % — ABNORMAL HIGH (ref 11.5–15.5)
WBC: 5.9 10*3/uL (ref 4.0–10.5)

## 2016-07-07 LAB — T4, FREE: FREE T4: 0.97 ng/dL (ref 0.61–1.12)

## 2016-07-07 NOTE — Progress Notes (Addendum)
Patient ID: Dale [name], male   DOB: Mar 26, 1966, 50 y.o.   MRN: [id number]                                                                PROGRESS NOTE                                                                                                                                                                                                             Patient Demographics:    Dale [name], is a 50 y.o. male, DOB - 02/25/1966, LTJ:[id number]  Admit date - 06/16/2016   Admitting Physician Brand Males, MD  Outpatient Primary MD for the patient is No primary care provider on file.  LOS - 21  Outpatient Specialists:  Chief Complaint  Patient presents with  . Aspiration  . Possible Sepsis        Brief Narrative  50 year old male with complex medical history including:  3/14-4/6: Admitted with strep pneumo meningitis, c/b cardiac arrest, and cardiogenic shock (EF 20%, recovered to 50%) briefly on amiodarone, ARDS requiring prolonged intubation and trach (now removed), ARF, shock liver, and adrenal insufficiency. He was discharged on 4/6 with weekly Pen G IM until 4/10. Has been re-admitted several times w/ recurrent infection and seen by ID. Re-admitted 7/13 again presenting with AMS thought to be septic/PNA in origin but had witnessed seizure in ED. He was found to be hypotensive and hypothermic, however WBC and Lactic were wnl. CXR with LLL opacification He required CVL placement and vasopressors for shock despite aggressive IVF resuscitation. ICU admission for shock treatment and continuous EEG. He was intubated in the ED for airway protection and transferred to [facility]. Extubated 7/21. Transferred to [facility] on 7/25.Patient develops signs of adrenal insufficiency again on 7/26 and had to be moved back to stepdown   Subjective:    Dale [name] remains minimally Interactive, very sluggish in his responses    Assessment  & Plan :    Active Problems:   Acute encephalopathy   Acute  respiratory failure (HCC)   Acute respiratory failure with hypercapnia (HCC)   Encounter for central line placement   Pressure ulcer   Hypoglycemia   Bradycardia   Hypoglycemia due to adrenal insufficiency Unable to tolerate weaning of steroids,now on steroids per  endocrine   Continues to be on D10 infusion and 10 mL/h We have been unable to discontinue D10,       Bradycardia >currently sinus in 40's-80's H/O Cardiac Arrest w/ NSTEMI H/O Systolic CHF - EF 45 - 21% per echo from June 2017 H/O Hyperlipidemia Continue aspirin and Lipitor Cardiology was consulted for bradycardia, sinus bradycardia with narrow QRS complex, no need for pacemaker, avoid AV nodal blocking drugs   Acute Encephalopathy - ongoing. Seems to be slowly improving. Occasionally lethargic. Following commands. Waxing and waning mental status, repeat CT scan on 7/26 did not show any evidence of acute intracranial abnormality. Lacunar infarcts in the left basal ganglia present on CT scan on 7/14 Patient has been evaluated extensively by neurology during this admission, without any improvement in his mental status, no trauma on CT from 8/2 from the fall yesterday   Adrenal insufficiency-manifesting as hypothermia hypoglycemia hypotension-consulted Dr. Dwyane Dee from endocrinology Has not been able to tolerate oral hydrocortisone thus far frequently switching back to IV hydrocortisone,  Cortisol level was 4.3 on admission, was 1.2 on 4/17  Seizures. -strep pneumo meningitis earlier this year, Continue with Keppra and Depakote, dose adjusted during this hospitalization, continue prn Ativan for seizures,Dale [name] appears to be waxing and waning.He has no evidence of any ongoing seizure . Depakote level therapeutic    New Subacute Ischemia - extensive workup by neurology as follows H/O strep pneumo meningitis, hydrocephalus, new CVA.Seizure disorder Neurology suspectsmild left upper extremity weakness related to the  left basal ganglia infarction.  CT head 7/14:New areas of subacute to chronic ischemia on the left. Progression in the degree of sinusitis as described. EEG 7/14:This was an abnormal EEG due to the presence of moderate background slowing. This slowing is non-specific and is indicative of encephalopathy. MRI 7/18:1. Normal expected interval evolution of previously identified infarct involving the left basal ganglia/corona radiata. Additionally, previously seen signal abnormalities within the bilateral basal ganglia have also largely resolved relative to previous MRI from 03/24/2016. Similarly, changes at the right parietal cortex have also evolved. No new areas of ischemia orinjury identified.  Persistent layering debris/hemorrhage within the lateral ventricles, likely related to recent history of meningitis. No Hydrocephalus. Continuous EEG 7/20: Mild diffuse encephalopathy. No electrographic seizures. Patient remains at high-risk for aspiration with waxing and waning mental status   Sepsis - Possible HCAP-->completed abx course  h/O strep pneumo meningitis (march 2017). Received repeat doses empiric vanco and zosyn from 7/13 to 7/22 due to hypothermia, cx's performed No evidence of any underlying infection at this time   Acute Hypoxic Respiratory Failure Unable to Protect Airway - In setting of seizures.Intubated from 7/13-7/21 HCAP , chest x-ray on 7/22 showed left lower lobe collapse, chest x-ray on 7/27 showed interval improvement with residual hazy opacification in the right base H/O Tracheostomy March 2017 - S/P Decannulation. Mobilize-will need rehabilitation Pulm hygiene   Shock - Resolved. Possibly secondary to sepsis.   Hypokalemia - resolved  Hypernatremia/Hyperchloremia - Mild. Continue to encourage by mouth intake  Urinary retention - Foley replaced.,  Trending UOP with Foley. Bethanechol started 7/18.Continue Flomax  Dysphagia  Dysphagia 1  (puree) nectar thick liquid based on speech therapy evaluation   Anemia of chronic disease No signs of active bleeding.  Stable,  Check cbc in am    Code Status : FULL CODE  Family Communication  : Discussed with patient's wife and entire extended family, patient has not improved neurologically over the last several days of this admission, do not  anticipate any meaningful recovery,no stabilization of vital signs ,  wife feels that the patient' cannot be left to die, making him a DNR would be like killing him, if his vital signs are abnormal, she would like to bring him back,. Suggested that patient is not a candidate for intubation in the future, recommended DNR/DNI, at this point they have declined palliative care recommendations as well as recommendations multiple physicians consulting on the patient. He remains at high risk of aspiration, reintubation, readmission,his vitals change every day , he is unstable on daily basis, high risk for readmission. Therefore I have requested case manager to discuss with  administration about how to handle this situation    Disposition Plan  : SNF [facility], anticipate discharge tomorrow if no recurrent hypoglycemia overnight  Barriers For Discharge :   Consults  :  Cardiology, neurology,endocrinology,  Procedures  :   DVT Prophylaxis  :  Lovenox -  SCDs  Lab Results  Component Value Date   PLT 214 07/07/2016    Antibiotics  :  Vanc/zosyn 7/13-7/22     Objective:   Vitals:   07/06/16 1826 07/06/16 2143 07/07/16 0524 07/07/16 1158  BP: 100/64 118/65 122/73 96/70  Pulse: (!) 54 (!) 57 (!) 45 72  Resp: '17 16 16 12  '$ Temp:  (!) 93.3 F (34.1 C) (!) 94.7 F (34.8 C) 98.4 F (36.9 C)  TempSrc:  Rectal Rectal Oral  SpO2: 97% 96% 99% 93%  Weight:      Height:        Wt Readings from Last 3 Encounters:  07/06/16 65.4 kg (144 lb 3.2 oz)  05/30/16 55.4 kg (122 lb 2.2 oz)  05/09/16 55.8 kg (123 lb)     Intake/Output Summary (Last  24 hours) at 07/07/16 1449 Last data filed at 07/07/16 1226  Gross per 24 hour  Intake              960 ml  Output             1075 ml  Net             -115 ml     Physical Exam  Awake Alert, Oriented X ? No new F.N deficits, Normal affect [place].AT,PERRAL Supple Neck,No JVD, No cervical lymphadenopathy appriciated.  Symmetrical Chest wall movement, Good air movement bilaterally, CTAB RRR,No Gallops,Rubs or new Murmurs, No Parasternal Heave +ve B.Sounds, Abd Soft, No tenderness, No organomegaly appriciated, No rebound - guarding or rigidity. No Cyanosis, Clubbing or edema, No new Rash or bruise  Pt able to move all 4 ext, pt able to sit up in bed    Data Review:    CBC  Recent Labs Lab 07/02/16 0319 07/04/16 0212 07/06/16 0322 07/07/16 0326  WBC 8.2 10.1 8.2 5.9  HGB 8.8* 8.1* 9.3* 9.1*  HCT 27.7* 25.9* 29.4* 28.9*  PLT 319 283 247 214  MCV 89.9 89.9 90.2 88.9  MCH 28.6 28.1 28.5 28.0  MCHC 31.8 31.3 31.6 31.5  RDW 17.2* 17.6* 17.9* 17.4*    Chemistries   Recent Labs Lab 07/01/16 0224 07/02/16 0319 07/04/16 0212 07/06/16 0322 07/07/16 0326  NA 146* 144 147* 146* 144  K 4.4 4.6 3.6 3.8 3.1*  CL 110 114* 114* 107 105  CO2 '26 24 28 29 '$ 32  GLUCOSE 91 102* 91 79 105*  BUN '13 14 11 9 9  '$ CREATININE 0.98 0.89 0.79 0.76 0.76  CALCIUM 8.2* 8.1* 8.1* 8.3* 8.3*  AST 16 19 12*  13* 13*  ALT 19 16* 12* 12* 14*  ALKPHOS 53 49 44 48 50  BILITOT 0.5 0.5 0.3 0.2* 0.1*   ------------------------------------------------------------------------------------------------------------------ No results for input(s): CHOL, HDL, LDLCALC, TRIG, CHOLHDL, LDLDIRECT in the last 72 hours.  Lab Results  Component Value Date   HGBA1C 5.3 05/20/2016   ------------------------------------------------------------------------------------------------------------------ No results for input(s): TSH, T4TOTAL, T3FREE, THYROIDAB in the last 72 hours.  Invalid input(s):  FREET3 ------------------------------------------------------------------------------------------------------------------ No results for input(s): VITAMINB12, FOLATE, FERRITIN, TIBC, IRON, RETICCTPCT in the last 72 hours.  Coagulation profile No results for input(s): INR, PROTIME in the last 168 hours.  No results for input(s): DDIMER in the last 72 hours.  Cardiac Enzymes No results for input(s): CKMB, TROPONINI, MYOGLOBIN in the last 168 hours.  Invalid input(s): CK ------------------------------------------------------------------------------------------------------------------    Component Value Date/Time   BNP 23.8 03/20/2016 1800    Inpatient Medications  Scheduled Meds: . aspirin  325 mg Per Tube Daily  . atorvastatin  20 mg Oral q1800  . bethanechol  10 mg Oral Q8H  . chlorhexidine gluconate (SAGE KIT)  15 mL Mouth Rinse BID  . divalproex  750 mg Oral Q8H  . enoxaparin (LOVENOX) injection  40 mg Subcutaneous Q24H  . fludrocortisone  0.1 mg Oral Daily  . folic acid  1 mg Oral Daily  . hydrocortisone  80 mg Oral [id number]  . insulin aspart  0-15 Units Subcutaneous TID WC  . insulin aspart  0-5 Units Subcutaneous QHS  . levETIRAcetam  1,500 mg Oral BID  . thiamine  100 mg Oral Daily   Continuous Infusions: . dextrose 50 mL/hr at 07/07/16 1324   PRN Meds:.LORazepam, RESOURCE THICKENUP CLEAR, sodium chloride  Micro Results Recent Results (from the past 240 hour(s))  Culture, blood (routine x 2)     Status: None   Collection Time: 06/30/16 10:00 AM  Result Value Ref Range Status   Specimen Description BLOOD RIGHT ARM  Final   Special Requests BOTTLES DRAWN AEROBIC AND ANAEROBIC 10CC  Final   Culture NO GROWTH 5 DAYS  Final   Report Status 07/05/2016 FINAL  Final  Culture, blood (routine x 2)     Status: None   Collection Time: 06/30/16 10:10 AM  Result Value Ref Range Status   Specimen Description BLOOD RIGHT HAND  Final   Special Requests   Final    BOTTLES  DRAWN AEROBIC AND ANAEROBIC 10CC AER [place] ANA   Culture NO GROWTH 5 DAYS  Final   Report Status 07/05/2016 FINAL  Final    Radiology Reports Dg Pelvis 1-2 Views  Result Date: 07/06/2016 CLINICAL DATA:  Fall EXAM: PELVIS - 1-2 VIEW COMPARISON:  None. FINDINGS: No fracture or dislocation is seen. Follow hip joint spaces are preserved. Visualized bony pelvis appears intact. IMPRESSION: No fracture or dislocation is seen. Electronically Signed   By: Julian Hy M.D.   On: 07/06/2016 20:32   Dg Abd 1 View  Result Date: 06/16/2016 CLINICAL DATA:  Orogastric tube placement. EXAM: ABDOMEN - 1 VIEW COMPARISON:  05/25/2016 FINDINGS: An orogastric tube is now seen with tip overlying the distal stomach in the expected region of the pylorus. A left femoral central venous catheter is seen in place as well as a temperature probe within the bladder. The bowel gas pattern is normal. IMPRESSION: Orogastric tube tip overlies the distal stomach, in expected region of pylorus. Electronically Signed   By: Earle Gell M.D.   On: 06/16/2016 20:50   Ct Head Wo Contrast  Result Date: 07/06/2016 CLINICAL DATA:  Fall from bed EXAM: CT HEAD WITHOUT CONTRAST TECHNIQUE: Contiguous axial images were obtained from the base of the skull through the vertex without intravenous contrast. COMPARISON:  06/29/2016 FINDINGS: Hypodensity along the left inferior caudate (series 2/ image 14 ; coronal image 30), new from the prior, worrisome for acute/subacute infarct. Adjacent chronic left basal ganglia lacunar infarcts. No evidence of parenchymal hemorrhage or extra-axial fluid collection. No mass lesion, mass effect, or midline shift. Subcortical white matter and periventricular small vessel ischemic changes. Mild global cortical atrophy.  No ventriculomegaly. Partial opacification of the bilateral maxillary and left sphenoid sinuses. Mastoid air cells are clear. No evidence of calvarial fracture. IMPRESSION: Acute versus subacute infarct  involving the left inferior caudate, new from 06/29/2016. No evidence of traumatic injury. These results will be called to the ordering clinician or representative by the Radiologist Assistant, and communication documented in the PACS or zVision Dashboard. Electronically Signed   By: Julian Hy M.D.   On: 07/06/2016 20:25   Ct Head Wo Contrast  Result Date: 06/29/2016 CLINICAL DATA:  Altered mental status EXAM: CT HEAD WITHOUT CONTRAST TECHNIQUE: Contiguous axial images were obtained from the base of the skull through the vertex without intravenous contrast. COMPARISON:  MRI brain dated 06/21/2016 FINDINGS: No evidence of parenchymal hemorrhage or extra-axial fluid collection. No mass lesion, mass effect, or midline shift. No CT evidence of acute infarction. Lacunar infarcts in the left corona radiata and basal ganglia. Subcortical white matter and periventricular small vessel ischemic changes. Cerebral volume is within normal limits.  No ventriculomegaly. New complete opacification of the bilateral maxillary sinuses. Partial opacification of the bilateral ethmoid and sphenoid sinuses. Partial opacification of the right mastoid air cells. No evidence of calvarial fracture. IMPRESSION: No evidence of acute intracranial abnormality. Lacunar infarcts in the left basal ganglia and corona radiata. Electronically Signed   By: Julian Hy M.D.   On: 06/29/2016 16:31  Ct Head Wo Contrast  Result Date: 06/17/2016 CLINICAL DATA:  Unresponsive EXAM: CT HEAD WITHOUT CONTRAST TECHNIQUE: Contiguous axial images were obtained from the base of the skull through the vertex without intravenous contrast. COMPARISON:  05/19/2016 FINDINGS: The bony calvarium is intact. Increased mucosal thickening is noted within the ethmoid and sphenoid sinus. Air-fluid levels are noted within the maxillary antra bilaterally consistent with a more acute degree of sinusitis. Lacunar infarct is noted within the left basal ganglia and  left thalamus extending into the deep white matter new from the prior exam. No findings to suggest acute hemorrhage are noted. IMPRESSION: New areas of subacute to chronic ischemia on the left. Progression in the degree of sinusitis as described. Electronically Signed   By: Inez Catalina M.D.   On: 06/17/2016 13:06   Mr Jeri Cos HW Contrast  Result Date: 06/21/2016 CLINICAL DATA:  Initial evaluation for recurrent altered mental status, seizures, history of strep pneumo meningitis. Question new areas of and within the left basal ganglia. EXAM: MRI HEAD WITHOUT AND WITH CONTRAST TECHNIQUE: Multiplanar, multiecho pulse sequences of the brain and surrounding structures were obtained without and with intravenous contrast. CONTRAST:  8m MULTIHANCE GADOBENATE DIMEGLUMINE 529 MG/ML IV SOLN COMPARISON:  Comparison made with prior CT from 06/17/2016 as well as previous MRI from 03/24/2016. FINDINGS: Diffuse prominence of the CSF containing spaces compatible with generalized cerebral atrophy. There has been normal interval expected evolution of previously identified acute ischemic infarct involving the left corona radiata/basal ganglia, which now demonstrates progressive encephalomalacia without significant diffusion abnormality.  Question minimal faint enhancement within this region extending towards the left cerebral peduncle, likely related to the late subacute ischemia (series 11, image 28). Additional remote infarctions involving the bilateral basal ganglia again seen. Chronic blood products again noted within the left external capsule. No new areas of restricted diffusion to suggest new ischemia or injury. Similar FLAIR signal abnormality at the right parietal cortex. Associated subtle laminar necrosis within this region (series 3, image 6). No significant enhancement now seen within this region. Additional focus of cortical flares abnormality within the right frontal lobe slightly more prominent as compared to  previous exam, although this appears to be chronic in nature. No appreciable enhancement within these areas. No other abnormal enhancement elsewhere within the brain. Major intracranial vascular flow voids are preserved. No mass lesion, mass effect, or midline shift. Layering susceptibility artifact within the stent was of both lateral ventricles, likely debris/chronic hemorrhage related to prior meningitis, similar to prior. No hydrocephalus. Craniocervical junction within normal limits. Visualized upper cervical spine unremarkable. Pituitary gland normal. No acute abnormality about the globes and orbits. Extensive opacity throughout the paranasal sinuses with fluid levels within the sphenoid and maxillary sinuses. Bilateral mastoid effusions. Fluid within the nasopharynx. Patient likely intubated. Bone marrow signal intensity within normal limits. No scalp soft tissue abnormality. IMPRESSION: 1. Normal expected interval evolution of previously identified infarct involving the left basal ganglia/corona radiata. Additionally, previously seen signal abnormalities within the bilateral basal ganglia have also largely resolved relative to previous MRI from 03/24/2016. Similarly, changes at the right parietal cortex have also evolved. No new areas of ischemia or injury identified. 2. Persistent layering debris/hemorrhage within the lateral ventricles, likely related to recent history of meningitis. No hydrocephalus. Electronically Signed   By: Jeannine Boga M.D.   On: 06/21/2016 04:55   Dg Chest Port 1 View  Result Date: 07/05/2016 CLINICAL DATA:  Altered mental status EXAM: PORTABLE CHEST 1 VIEW COMPARISON:  06/30/2016 FINDINGS: Stable hazy appearance of the bases which is likely combination of atelectasis and pleural fluid. No edema, air bronchograms, or pneumothorax. Normal heart size and mediastinal contours. IMPRESSION: Stable appearance of the chest. Hazy basilar opacities again favor small effusions and  atelectasis. Electronically Signed   By: Monte Fantasia M.D.   On: 07/05/2016 09:19   Dg Chest Port 1 View  Result Date: 06/30/2016 CLINICAL DATA:  Altered mental status.  Low body temperature. EXAM: PORTABLE CHEST 1 VIEW COMPARISON:  06/25/2016 FINDINGS: Lungs are adequately inflated as patient is slightly rotated to the right. Interval resolution of left base opacification with improvement but mild residual hazy opacification in the right base possibly due to small layering effusions/atelectasis, although cannot exclude infection cardiomediastinal silhouette and remainder of the exam is unchanged. IMPRESSION: Interval improvement with mild residual hazy opacification in the right base likely small layering effusions/atelectasis although cannot exclude infection. Electronically Signed   By: Marin Olp M.D.   On: 06/30/2016 08:56  Dg Chest Port 1 View  Result Date: 06/25/2016 CLINICAL DATA:  Pneumonia. EXAM: PORTABLE CHEST 1 VIEW COMPARISON:  06/22/2016. FINDINGS: Interval extubation. Left IJ central line tip projects over the SVC. Heart size normal. Left lower lobe collapse/ consolidation. Hazy opacification over the lower hemithoraces bilaterally, new on the right. IMPRESSION: 1. Left lower lobe collapse/consolidation. 2. Hazy opacification over both lower hemithoraces may be due to layering pleural fluid and/or true airspace opacification. Electronically Signed   By: Lorin Picket M.D.   On: 06/25/2016 10:13   Dg Chest [facility]  Result Date: 06/22/2016 CLINICAL DATA:  Respiratory failure, acute encephalopathy, cardiogenic shock, acute and chronic CHF, current smoker. EXAM: PORTABLE CHEST 1 VIEW COMPARISON:  Portable chest x-ray of June 19, 2016 FINDINGS: The right lung is adequately inflated and clear. On the left there is persistent basilar atelectasis or pneumonia with small pleural effusion. The heart is normal in size. The pulmonary vascularity is not engorged. The endotracheal tube tip  lies 5.3 cm above the carina. The esophagogastric tube tip projects below the inferior margin of the image. The left internal jugular venous catheter tip projects over the proximal third of the SVC. IMPRESSION: Persistent left lower lobe atelectasis or pneumonia with trace pleural effusion. No CHF. The support tubes are in stable position. Electronically Signed   By: David  Martinique M.D.   On: 06/22/2016 07:20   Dg Chest Portable 1 View  Result Date: 06/19/2016 CLINICAL DATA:  Respiratory failure EXAM: PORTABLE CHEST 1 VIEW COMPARISON:  06/18/2016 FINDINGS: ET tube tip is above the carina. There is a left IJ catheter with tip in the projection of the SVC. Enteric tube tip is below the hemidiaphragm. Persistent opacity within the left lung base. Right lung is clear. IMPRESSION: 1. Stable position of the support apparatus. 2. No change in left base opacity. Electronically Signed   By: Kerby Moors M.D.   On: 06/19/2016 07:44   Dg Chest Port 1 View  Result Date: 06/18/2016 CLINICAL DATA:  50 year old male with a history of respiratory failure EXAM: PORTABLE CHEST 1 VIEW COMPARISON:  06/17/2016, 06/16/2016 FINDINGS: Cardiomediastinal silhouette unchanged in size and contour. Endotracheal tube unchanged, terminating 3.8 cm above the carina. Unchanged left IJ approach central venous catheter, appearing to terminate superior vena cava. Gastric tube projects over the mediastinum, terminating out of the field of view. No visualized pneumothorax. Improved aeration in the left mid lung, with persistent opacity in the retrocardiac region. Blunting of left costophrenic angle. IMPRESSION: Improved aeration of the left lung, with persistent left basilar opacity, potentially consolidation, edema, and/ or atelectasis. Small left pleural effusion not excluded. Unchanged position of endotracheal tube, gastric tube, left IJ central venous catheter. Signed, Dulcy Fanny. Earleen Newport, DO [facility]  [facility] Electronically Signed   By: Corrie Mckusick D.O.   On: 06/18/2016 10:15   Dg Chest Port 1 View  Result Date: 06/17/2016 CLINICAL DATA:  50 year old male with enteric tube placement. EXAM: PORTABLE CHEST 1 VIEW COMPARISON:  A abdominal radiograph dated 06/16/2016 FINDINGS: An endotracheal tube is noted with tip approximately 3 cm above the carina. Single portable view of the chest demonstrates patchy area of hazy airspace opacity in the left mid to lower lung field. There is no pleural effusion or pneumothorax. The cardiac silhouette is within normal limits. Left IJ central line with tip over central SVC. An enteric tube extends into the epigastric area with tip superimposed over the right L3 pedicle likely in the distal stomach. There is no bowel dilatation or evidence of obstruction. No free air. No radiopaque calculi or foreign object. The soft tissues and osseous structures appear unremarkable. The previously seen left femoral central venous catheter has been removed. A temperature probe is partially visualized over the bladder. IMPRESSION: Endotracheal tube above the carina. Patchy area of airspace density in the left mid to lower lung field. Follow-up recommended. Enteric tube in the distal stomach.  No bowel obstruction. Electronically Signed   By: Anner Crete M.D.   On: 06/17/2016 02:48   Dg Chest Port 1  View  Result Date: 06/16/2016 CLINICAL DATA:  Endotracheal tube placement EXAM: PORTABLE CHEST 1 VIEW COMPARISON:  06/16/2016 FINDINGS: There is an endotracheal tube with the tip 3.7 cm above the carina. Nasogastric tube coursing below the diaphragm. There is hazy left lower lobe airspace disease. There is no pleural effusion or pneumothorax. The heart and mediastinal contours are unremarkable. The osseous structures are unremarkable. IMPRESSION: 1. Hazy left lower lobe airspace disease concerning for pneumonia. 2. Endotracheal tube with the tip 3.7 cm above the carina.  Electronically Signed   By: Kathreen Devoid   On: 06/16/2016 22:41   Dg Chest Port 1 View  Result Date: 06/16/2016 CLINICAL DATA:  Intubation.  Followup pneumonia. EXAM: PORTABLE CHEST 1 VIEW 8:24 p.m.: COMPARISON:  Portable chest x-ray earlier same date 5:49 p.m. and previously. FINDINGS: Endotracheal tube tip in satisfactory position projecting approximately 6 cm above the carina. Nasogastric tube courses below the diaphragm into the stomach though its tip is not included on the image. Consolidation in the left lower lobe as noted earlier. Developing patchy opacities at the right lung base. IMPRESSION: 1. Support apparatus satisfactory. Endotracheal tube tip approximately 6 cm above the carina. Nasogastric tube courses below the diaphragm into the stomach. 2. Left lower lobe pneumonia as noted earlier. Developing atelectasis and/or pneumonia involving the right lung base. Electronically Signed   By: Evangeline Dakin M.D.   On: 06/16/2016 20:49   Dg Chest Port 1 View  Result Date: 06/16/2016 CLINICAL DATA:  50 year old with possible aspiration and sepsis. EXAM: PORTABLE CHEST 1 VIEW COMPARISON:  05/29/2016, 05/27/2016 and earlier. FINDINGS: Patient is rotated to the left. Cardiomediastinal silhouette unremarkable, unchanged. Airspace consolidation in the retrocardiac left lung base. Lungs otherwise clear. Pulmonary vascularity normal. No visible pleural effusions. IMPRESSION: Left lower lobe pneumonia. Electronically Signed   By: Evangeline Dakin M.D.   On: 06/16/2016 18:22   Dg Abd Portable 1v  Result Date: 06/17/2016 CLINICAL DATA:  50 year old male with enteric tube placement. EXAM: PORTABLE CHEST 1 VIEW COMPARISON:  A abdominal radiograph dated 06/16/2016 FINDINGS: An endotracheal tube is noted with tip approximately 3 cm above the carina. Single portable view of the chest demonstrates patchy area of hazy airspace opacity in the left mid to lower lung field. There is no pleural effusion or  pneumothorax. The cardiac silhouette is within normal limits. Left IJ central line with tip over central SVC. An enteric tube extends into the epigastric area with tip superimposed over the right L3 pedicle likely in the distal stomach. There is no bowel dilatation or evidence of obstruction. No free air. No radiopaque calculi or foreign object. The soft tissues and osseous structures appear unremarkable. The previously seen left femoral central venous catheter has been removed. A temperature probe is partially visualized over the bladder. IMPRESSION: Endotracheal tube above the carina. Patchy area of airspace density in the left mid to lower lung field. Follow-up recommended. Enteric tube in the distal stomach.  No bowel obstruction. Electronically Signed   By: Anner Crete M.D.   On: 06/17/2016 02:48   Dg Swallowing Func-speech Pathology  Result Date: 06/29/2016 Objective Swallowing Evaluation: Type of Study: MBS-Modified Barium Swallow Study Patient Details Name: Dale [name] MRN: [id number] Date of Birth: Mar 13, 1966 Today's Date: 06/29/2016 Time: SLP Start Time (ACUTE ONLY): 1215-SLP Stop Time (ACUTE ONLY): 1235 SLP Time Calculation (min) (ACUTE ONLY): 20 min Past Medical History: Past Medical History: Diagnosis Date . Acute encephalopathy  . Acute ischemic stroke ([place])  . Acute renal failure (  [place])  . Acute respiratory failure with hypoxia ([place])  . AKI (acute kidney injury) ([place]) 02/16/2016 . Altered mental status  . Cardiac arrest ([place])  . Cardiogenic shock ([place])  . Cerebral septic emboli ([place])  . Cerebral thrombosis with cerebral infarction 03/25/2016 . CHF (congestive heart failure) ([place])  . Convulsions/seizures ([place]) 05/04/2016  Seizure disorder - Continue Keppra, Depakote.    . Dementia 04/27/2016 . Depression  . Dysphagia 05/04/2016  Dysphagia - Dysphagia 1 diet per SLP    . ETOH abuse  . Hydrocephalus  . Hyperlipidemia 05/04/2016  Dyslipidemia - Continue statin.    Marland Kitchen Hypernatremia  . Hypothermia 03/20/2016  . Low serum cortisol level ([facility]) 05/04/2016  Low a.m. cortisol  - Was 1.2 checked on 04/03/2016  - This likely is reflective of cortisol suppression from steroid use - Tapered steroids off  . Meningitis, pneumococcal, recurrent  . Meningitis, streptococcal 02/16/2016 . Meningoencephalitis  . NSTEMI (non-ST elevated myocardial infarction) ([place]) 03/14/2016 . Protein-calorie malnutrition, severe ([place])  . Respiratory failure ([place]) 03/03/2016 . Septic shock ([place])  . Severe sepsis ([place])  . 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 Substance abuse  . Systolic and diastolic CHF, chronic ([place])  . Tobacco abuse 02/16/2016 . Trichomonal urethritis in male 02/16/2016 . Urinary retention 05/04/2016  Urinary retention - Continue Flomax    Past Surgical History: Past Surgical History: Procedure Laterality Date . ANKLE SURGERY   . CARDIAC CATHETERIZATION N/A 02/17/2016  Procedure: IABP Insertion;  Surgeon: Wellington Hampshire, MD;  Location: [facility];  Service: Cardiovascular;  Laterality: N/A; . 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  Re-admitted 7/13 again presenting with AMS thought to be septic/PNA in origin but had witnessed seizure in ED.  SLP evaluations during previous admissions have recommended Dys 1 diet and nectar thick liquids with advancement to Dys 2 textures prior to discharge.  Intubated 7/13-21.  Subjective: Pt kept eyes closed during MBS. Appeared lethargic, but accepted po presentations Assessment / Plan / Recommendation CHL IP CLINICAL IMPRESSIONS 06/29/2016 Therapy Diagnosis Mild oral phase  dysphagia;Moderate pharyngeal phase dysphagia Clinical Impression Pt presents with mild oral, moderate pharyngeal phase dysphagia, characterized by poor bolus formation and posterior propulsion, resulting in posterior spillage to the vallecula on nectar, honey, and puree consistencies, and at the pyriform sinus on thin liquids. Silent aspiration was noted during the swallow of thin liquids. No penetration  or aspiration was observed on nectar, honey, or puree consistencies, however, risk of episodic aspiration of all consistencies is possible, given poor oral prep and delayed swallow reflex. Solid consistencies were not provided at this time, due to extended ineffective oral bolus formation and propulsion and associated risk of aspiration with fatigue.  Recommend Puree diet and nectar thick liquids with full supervision. Pt needs to be monitored closely during meals to insure oral cavity is clear before provided the next bolus. Safe swallow precautions were sent back to room with pt. Impact on safety and function Moderate aspiration risk   CHL IP TREATMENT RECOMMENDATION 06/29/2016 Treatment Recommendations Therapy as outlined in treatment plan below   Prognosis 06/29/2016 Prognosis for Safe Diet Advancement Fair Barriers to Reach Goals Cognitive deficits CHL IP DIET  RECOMMENDATION 06/29/2016 SLP Diet Recommendations Dysphagia 1 (Puree) solids;Nectar thick liquid Liquid Administration via Straw;Cup Medication Administration Crushed with puree Compensations Slow rate;Small sips/bites;Minimize environmental distractions Postural Changes Remain semi-upright after after feeds/meals (Comment);Seated upright at 90 degrees   CHL IP OTHER RECOMMENDATIONS 06/29/2016 Recommended Consults -- Oral Care Recommendations Oral care BID Other Recommendations Order thickener from pharmacy;Prohibited food (jello, ice cream, thin soups);Remove water pitcher   CHL IP FOLLOW UP RECOMMENDATIONS 06/29/2016 Follow up Recommendations Skilled  Nursing facility   [facility] IP FREQUENCY AND DURATION 06/29/2016 Speech Therapy Frequency (ACUTE ONLY) min 2x/week Treatment Duration 2 weeks    CHL IP ORAL PHASE 06/29/2016 Oral Phase Impaired Oral - Honey Teaspoon Premature spillage;Reduced posterior propulsion Oral - Nectar Teaspoon Premature spillage;Reduced posterior propulsion;Delayed oral transit Oral - Nectar Straw Premature spillage;Reduced posterior propulsion;Delayed oral transit Oral - Thin Straw Premature spillage;Reduced posterior propulsion;Delayed oral transit Oral - Puree Premature spillage;Delayed oral transit;Reduced posterior propulsion  CHL IP CERVICAL ESOPHAGEAL PHASE 06/29/2016 Cervical Esophageal Phase [facility] Shonna Chock 06/29/2016, 1:27 PM Celia B. Bueche, [facility], CCC-SLP [phone]               Time Spent in minutes  51   Wilfrido Luedke M.D on 07/07/2016 at 2:49 PM  Between 7am to 7pm - Pager - [phone]  After 7pm go to [email or web address] - password [facility]  [facility] -  Office  [phone]

## 2016-07-07 NOTE — Progress Notes (Signed)
Speech Language Pathology Treatment: Dysphagia  Patient Details Name: Dale [name] MRN: [id number] DOB: 1966/10/23 Today's Date: 07/07/2016 Time: 2376-2831 SLP Time Calculation (min) (ACUTE ONLY): 13 min  Assessment / Plan / Recommendation Clinical Impression  Dysphagia treatment provided for diet tolerance check. Pt alert today during all PO trials. No overt s/s of aspiration noted with trials of dysphagia 1/ nectar thick liquids. No oral holding noted today; however, pt continues to demonstrate prolonged bolus formation and suspect continued delayed swallow initiation. NT noted that pt consumed 100% of breakfast this a.m. Recommend continuing dysphagia 1 diet/ nectar thick liquids (by cup- hand over hand assist effective), meds crushed in puree. Will continue to follow at least x1 to ensure tolerance.    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  Re-admitted 7/13 again presenting with AMS thought to be septic/PNA in origin but had witnessed seizure in ED.  SLP evaluations during previous admissions have recommended Dys 1 diet and nectar thick liquids with advancement to Dys 2 textures prior to discharge.  Intubated 7/13-21.       SLP Plan  Continue with current plan of care     Recommendations  Diet recommendations: Dysphagia 1 (puree);Nectar-thick liquid Liquids provided via: No straw;Cup Medication Administration: Crushed with puree Supervision: Full supervision/cueing for compensatory strategies;Staff to assist with self feeding Compensations: Slow rate;Small sips/bites;Minimize environmental distractions Postural Changes and/or Swallow Maneuvers: Seated upright 90 degrees             Oral Care Recommendations: Oral care BID Follow up  Recommendations: Skilled Nursing facility Plan: Continue with current plan of care     GO                Metro Kung, MA, CCC-SLP 07/07/2016, 10:23 AM [phone]

## 2016-07-07 NOTE — Progress Notes (Signed)
Nutrition Follow-up  DOCUMENTATION CODES:   Severe malnutrition in context of chronic illness  INTERVENTION:  Continue Magic cup TID with meals, each supplement provides 290 kcal and 9 grams of protein   NUTRITION DIAGNOSIS:   Malnutrition (Severe) related to chronic illness (meningitis/dementia) as evidenced by severe depletion of muscle mass, severe depletion of body fat.  Ongoing  GOAL:   Patient will meet greater than or equal to 90% of their needs  Being Met  MONITOR:   Vent status, I & O's, Labs, Weight trends  REASON FOR ASSESSMENT:   Consult Assessment of nutrition requirement/status (TF recommendations)  ASSESSMENT:   Pt admitted from SNF pt with multiple admissions since March 2017 when he had strep pneumo meningitis, cardiac arrest prolonged intubation. Pt now readmitted with sepsis and seizure.   Pt not responsive at time of visit. Sitter at bedside states that patient has been eating 100% of his meals today, but has not received Magic Cup supplement. Per nursing notes pt has been eating anywhere from 10% to 100% of meals the past several days, usually 50%. Pt's weight has gone up 9 lbs in the past week.  Labs: low hemoglobin, low potassium  Diet Order:  Diet - low sodium heart healthy DIET - DYS 1 Room service appropriate? No; Fluid consistency: Nectar Thick  Skin:  Wound (see comment) (stage II pressure ulcer on sacrum)  Last BM:  7/30  Height:   Ht Readings from Last 1 Encounters:  06/17/16 _0  (1.727 m)    Weight:   Wt Readings from Last 1 Encounters:  07/06/16 144 lb 3.2 oz (65.4 kg)    Ideal Body Weight:  70 kg  BMI:  Body mass index is 21.93 kg/m.  Estimated Nutritional Needs:   Kcal:  1900-2100  Protein:  95-115 grams  Fluid:  > 1.9 L/day  EDUCATION NEEDS:   No education needs identified at this time  [place], LDN Inpatient Clinical Dietitian Pager: [phone] After Hours Pager: [phone]

## 2016-07-07 NOTE — Clinical Social Work Note (Signed)
Patient cannot have sitter for 24 hours in order to go to SNF. CSW paged MD. CSW called [facility] to give updates.  Charlynn Court, CSW [phone]

## 2016-07-08 DIAGNOSIS — E271 Primary adrenocortical insufficiency: Secondary | ICD-10-CM

## 2016-07-08 LAB — GLUCOSE, CAPILLARY
GLUCOSE-CAPILLARY: 64 mg/dL — AB (ref 65–99)
GLUCOSE-CAPILLARY: 55 mg/dL — AB (ref 65–99)
GLUCOSE-CAPILLARY: 71 mg/dL (ref 65–99)
GLUCOSE-CAPILLARY: 119 mg/dL — AB (ref 65–99)
GLUCOSE-CAPILLARY: 78 mg/dL (ref 65–99)
GLUCOSE-CAPILLARY: 101 mg/dL — AB (ref 65–99)
Glucose-Capillary: 63 mg/dL — ABNORMAL LOW (ref 65–99)

## 2016-07-08 MED ORDER — GLUCOSE 40 % PO GEL
ORAL | Status: AC
  Administered 2016-07-08: 12:00:00
  Filled 2016-07-08: qty 1

## 2016-07-08 MED ORDER — DEXTROSE 50 % IV SOLN
INTRAVENOUS | Status: AC
  Filled 2016-07-08: qty 50

## 2016-07-08 MED ORDER — HYDROCORTISONE 20 MG PO TABS
100.0000 mg | ORAL_TABLET | Freq: Three times a day (TID) | ORAL | Status: DC
  Administered 2016-07-08 – 2016-07-11 (×8): 100 mg via ORAL
  Filled 2016-07-08 (×10): qty 5

## 2016-07-08 MED ORDER — LEVETIRACETAM 100 MG/ML PO SOLN
1500.0000 mg | Freq: Two times a day (BID) | ORAL | Status: DC
  Administered 2016-07-08 – 2016-07-11 (×6): 1500 mg via ORAL
  Filled 2016-07-08 (×6): qty 15

## 2016-07-08 MED ORDER — DIVALPROEX SODIUM 125 MG PO CSDR
750.0000 mg | DELAYED_RELEASE_CAPSULE | Freq: Three times a day (TID) | ORAL | Status: DC
  Administered 2016-07-08 – 2016-07-11 (×9): 750 mg via ORAL
  Filled 2016-07-08 (×9): qty 6

## 2016-07-08 MED ORDER — POTASSIUM CHLORIDE CRYS ER 20 MEQ PO TBCR
40.0000 meq | EXTENDED_RELEASE_TABLET | Freq: Two times a day (BID) | ORAL | Status: DC
  Administered 2016-07-08 – 2016-07-09 (×4): 40 meq via ORAL
  Filled 2016-07-08 (×4): qty 2

## 2016-07-08 MED ORDER — SODIUM CHLORIDE 0.9% FLUSH
10.0000 mL | INTRAVENOUS | Status: DC | PRN
Start: 2016-07-08 — End: 2016-07-11
  Administered 2016-07-09 – 2016-07-11 (×4): 10 mL
  Filled 2016-07-08 (×4): qty 40

## 2016-07-08 NOTE — Clinical Social Work Note (Deleted)
Brother, Maisie Fus, and sister-in-law, Elnita Maxwell at bedside. Patient's brother states that patient wants him to become HCPOA. RN put in chaplain consult. Per Maisie Fus, he is the only relative who wants to "step up." The spouse listed in patient's chart is actually his girlfriend and his brother and sister-in-law do not want her to be contacted. CSW continuing to follow for discharge needs.  Charlynn Court, CSW [phone]

## 2016-07-08 NOTE — Progress Notes (Signed)
Subjective: Currently being treated empirically for adrenal insufficiency because of history of hypertension and low normal evening cortisol level  Since he was reportedly getting hypotensive with trying 40 mg 3 times a day of hydrocortisone he is now on 80 mg 3 times a day Currently his blood pressure has been not dropping low and he is not having any issues with hyponatremia  His general condition appears to be about the same and prognosis apparently poor  Objective: Vital signs in last 24 hours: Temp:  [97.5 F (36.4 C)-97.6 F (36.4 C)] 97.6 F (36.4 C) (08/04 1147) Pulse Rate:  [48-53] 48 (08/04 1147) Resp:  [12-17] 12 (08/04 1147) BP: (119-159)/(60-93) 119/60 (08/04 1147) SpO2:  [100 %] 100 % (08/04 1147) Weight:  [144 lb (65.3 kg)] 144 lb (65.3 kg) (08/04 0525) Weight change:  Last BM Date: 07/08/16  Intake/Output from previous day: 08/03 0701 - 08/04 0700 In: 2279.7 [P.O.:1040; I.V.:1239.7] Out: 3225 [Urine:3225] Intake/Output this shift: Total I/O In: 120 [P.O.:120] Out: 600 [Urine:600]  General appearance: slowed mentation  Lab Results:  Recent Labs  07/06/16 0322 07/07/16 0326  WBC 8.2 5.9  HGB 9.3* 9.1*  HCT 29.4* 28.9*  PLT 247 214   BMET  Recent Labs  07/06/16 0322 07/07/16 0326  NA 146* 144  K 3.8 3.1*  CL 107 105  CO2 29 32  GLUCOSE 79 105*  BUN 9 9  CREATININE 0.76 0.76  CALCIUM 8.3* 8.3*    Studies/Results: Dg Pelvis 1-2 Views  Result Date: 07/06/2016 CLINICAL DATA:  Fall EXAM: PELVIS - 1-2 VIEW COMPARISON:  None. FINDINGS: No fracture or dislocation is seen. Follow hip joint spaces are preserved. Visualized bony pelvis appears intact. IMPRESSION: No fracture or dislocation is seen. Electronically Signed   By: Julian Hy M.D.   On: 07/06/2016 20:32   Ct Head Wo Contrast  Result Date: 07/06/2016 CLINICAL DATA:  Fall from bed EXAM: CT HEAD WITHOUT CONTRAST TECHNIQUE: Contiguous axial images were obtained from the base of the  skull through the vertex without intravenous contrast. COMPARISON:  06/29/2016 FINDINGS: Hypodensity along the left inferior caudate (series 2/ image 14 ; coronal image 30), new from the prior, worrisome for acute/subacute infarct. Adjacent chronic left basal ganglia lacunar infarcts. No evidence of parenchymal hemorrhage or extra-axial fluid collection. No mass lesion, mass effect, or midline shift. Subcortical white matter and periventricular small vessel ischemic changes. Mild global cortical atrophy.  No ventriculomegaly. Partial opacification of the bilateral maxillary and left sphenoid sinuses. Mastoid air cells are clear. No evidence of calvarial fracture. IMPRESSION: Acute versus subacute infarct involving the left inferior caudate, new from 06/29/2016. No evidence of traumatic injury. These results will be called to the ordering clinician or representative by the Radiologist Assistant, and communication documented in the PACS or zVision Dashboard. Electronically Signed   By: Julian Hy M.D.   On: 07/06/2016 20:25    Medications:  Current Facility-Administered Medications:  .  aspirin tablet 325 mg, 325 mg, Per Tube, Daily, Rahul P Desai, PA-C, 325 mg at 07/08/16 0958 .  atorvastatin (LIPITOR) tablet 20 mg, 20 mg, Oral, q1800, Erick Colace, NP, 20 mg at 07/07/16 1745 .  bethanechol (URECHOLINE) tablet 10 mg, 10 mg, Oral, Q8H, Erick Colace, NP, 10 mg at 07/08/16 0514 .  chlorhexidine gluconate (SAGE KIT) (PERIDEX) 0.12 % solution 15 mL, 15 mL, Mouth Rinse, BID, Brand Males, MD, 15 mL at 07/08/16 0800 .  dextrose 50 % solution, , , ,  .  divalproex (DEPAKOTE) DR tablet 750 mg, 750 mg, Oral, Q8H, Jani Gravel, MD, 750 mg at 07/08/16 0514 .  enoxaparin (LOVENOX) injection 40 mg, 40 mg, Subcutaneous, Q24H, Jani Gravel, MD, 40 mg at 07/07/16 1547 .  fludrocortisone (FLORINEF) tablet 0.1 mg, 0.1 mg, Oral, Daily, Elayne Snare, MD, 0.1 mg at 07/08/16 0959 .  folic acid (FOLVITE) tablet 1 mg, 1  mg, Oral, Daily, Erick Colace, NP, 1 mg at 07/08/16 0958 .  hydrocortisone (CORTEF) tablet 100 mg, 100 mg, Oral, TID WC, Nayana Abrol, MD .  insulin aspart (novoLOG) injection 0-15 Units, 0-15 Units, Subcutaneous, TID WC, Collene Gobble, MD .  insulin aspart (novoLOG) injection 0-5 Units, 0-5 Units, Subcutaneous, QHS, Collene Gobble, MD .  levETIRAcetam (KEPPRA) tablet 1,500 mg, 1,500 mg, Oral, BID, Jani Gravel, MD, 1,500 mg at 07/08/16 0957 .  LORazepam (ATIVAN) injection 0.5 mg, 0.5 mg, Intravenous, Q6H PRN, Jani Gravel, MD, 0.5 mg at 07/07/16 2300 .  potassium chloride SA (K-DUR,KLOR-CON) CR tablet 40 mEq, 40 mEq, Oral, BID, Reyne Dumas, MD .  RESOURCE THICKENUP CLEAR, , Oral, PRN, Reyne Dumas, MD .  sodium chloride 0.9 % bolus 500 mL, 500 mL, Intravenous, Q6H PRN, Reyne Dumas, MD, 500 mL at 07/02/16 1208 .  thiamine (VITAMIN B-1) tablet 100 mg, 100 mg, Oral, Daily, Chesley Mires, MD, 100 mg at 07/08/16 4239  Assessment/Plan:  Probable adrenal insufficiency  No evidence of secondary hypothyroidism with normal free T4  Clinically stable on hydrocortisone 100 mg 3 times a day.  Recommend reducing this by 20 mg down to 80 mg 3 times a day for the next 2 days and another 20 mg 3 times a day every 3 days to a target dose of 20 mg twice a day  Please call if further consultation  required   LOS: 22 days   Tahjai Schetter 07/08/2016, 12:55 PM

## 2016-07-08 NOTE — Progress Notes (Signed)
Patient ID: Dale [name], male   DOB: 26-Feb-1966, 50 y.o.   MRN: [id number]                                                                PROGRESS NOTE                                                                                                                                                                                                             Patient Demographics:    Dale [name], is a 50 y.o. male, DOB - [birth year], KDT:[id number]  Admit date - 06/16/2016   Admitting Physician Dale Males, MD  Outpatient Primary MD for the patient is No primary care provider on file.  [name] - 22  Outpatient Specialists:  Chief Complaint  Patient presents with  . Aspiration  . Possible Sepsis        Brief Narrative  50 year old male with complex medical history including:  3/14-4/6: Admitted with strep pneumo meningitis, c/b cardiac arrest, and cardiogenic shock (EF 20%, recovered to 50%) briefly on amiodarone, ARDS requiring prolonged intubation and trach (now removed), ARF, shock liver, and adrenal insufficiency. He was discharged on 4/6 with weekly Pen G IM until 4/10. Has been re-admitted several times w/ recurrent infection and seen by ID. Re-admitted 7/13 again presenting with AMS thought to be septic/PNA in origin but had witnessed seizure in ED. He was found to be hypotensive and hypothermic, however WBC and Lactic were wnl. CXR with LLL opacification He required CVL placement and vasopressors for shock despite aggressive IVF resuscitation. ICU admission for shock treatment and continuous EEG. He was intubated in the ED for airway protection and transferred to [facility]. Extubated 7/21. Transferred to [facility] on 7/25.Patient develops signs of adrenal insufficiency again on 7/26 and had to be moved back to stepdown   Subjective:    Dale [name]  Is awake with a left lateral gaze which has been there for several days , but more awake today,Sitter at bedside states that patient has been  eating 100% of his meals today   Assessment  & Plan :    Active Problems:   Acute encephalopathy   Acute respiratory failure (HCC)   Acute respiratory failure with hypercapnia ([place])   Encounter for central line placement  Pressure ulcer   Hypoglycemia   Bradycardia   Hypoglycemia due to adrenal insufficiency Unable to tolerate weaning of steroids,now on steroids per endocrine , increased dose to 100 mg TID,PO Hold D10 infusion and monitor CBGs Feeding every 4 hours  Monitor CBGs on increased dose of steroids and frequent feeding and see what happens Suspect hypothalamic dysregulation, ACTH deficiency, endocrinology does not think that hypothermia and hypoglycemia is connected with adrenal insufficiency, Check ACTH levels  Bradycardia >currently sinus in 40's-80's H/O Cardiac Arrest w/ NSTEMI H/O Systolic CHF - EF 45 - 64% per echo from June 2017 H/O Hyperlipidemia Continue aspirin and Lipitor Cardiology was consulted for bradycardia, sinus bradycardia with narrow QRS complex, no need for pacemaker, avoid AV nodal blocking drugs   Acute Encephalopathy - ongoing. Seems to be slowly improving. Occasionally lethargic. Following commands. Waxing and waning mental status, repeat CT scan on 7/26 did not show any evidence of acute intracranial abnormality. Lacunar infarcts in the left basal ganglia present on CT scan on 7/14 Patient has been evaluated extensively by neurology during this admission, without any improvement in his mental status, no trauma on CT from 8/2 from the fall yesterday   Adrenal insufficiency-manifesting as hypothermia hypoglycemia hypotension-consulted Dr. Dwyane Dee from endocrinology Has not been able to tolerate oral hydrocortisone thus far frequently switching back to IV hydrocortisone,  Cortisol level was 4.3 on admission, was 1.2 on 4/17  Seizures. -strep pneumo meningitis earlier this year, Continue with Keppra and Depakote, dose adjusted during this  hospitalization, continue prn Ativan for seizures,Dale [name] appears to be waxing and waning.He has no evidence of any ongoing seizure . Depakote level therapeutic    New Subacute Ischemia - extensive workup by neurology as follows H/O strep pneumo meningitis, hydrocephalus, new CVA.Seizure disorder Neurology suspectsmild left upper extremity weakness related to the left basal ganglia infarction.  CT head 7/14:New areas of subacute to chronic ischemia on the left. Progression in the degree of sinusitis as described. EEG 7/14:This was an abnormal EEG due to the presence of moderate background slowing. This slowing is non-specific and is indicative of encephalopathy. MRI 7/18:1. Normal expected interval evolution of previously identified infarct involving the left basal ganglia/corona radiata. Additionally, previously seen signal abnormalities within the bilateral basal ganglia have also largely resolved relative to previous MRI from 03/24/2016. Similarly, changes at the right parietal cortex have also evolved. No new areas of ischemia orinjury identified.  Persistent layering debris/hemorrhage within the lateral ventricles, likely related to recent history of meningitis. No Hydrocephalus. Continuous EEG 7/20: Mild diffuse encephalopathy. No electrographic seizures. Patient remains at high-risk for aspiration with waxing and waning mental status   Sepsis - Possible HCAP-->completed abx course  h/O strep pneumo meningitis (march 2017). Received repeat doses empiric vanco and zosyn from 7/13 to 7/22 due to hypothermia, cx's performed No evidence of any underlying infection at this time   Acute Hypoxic Respiratory Failure Unable to Protect Airway - In setting of seizures.Intubated from 7/13-7/21 HCAP , chest x-ray on 7/22 showed left lower lobe collapse, chest x-ray on 7/27 showed interval improvement with residual hazy opacification in the right base H/O Tracheostomy March 2017 -  S/P Decannulation. Mobilize-will need rehabilitation Pulm hygiene   Shock - Resolved. Possibly secondary to sepsis.   Hypokalemia - resolved  Hypernatremia/Hyperchloremia - Mild. Continue to encourage by mouth intake  Urinary retention - Foley replaced.,  Trending UOP with Foley. Bethanechol started 7/18.Continue Flomax  Dysphagia  Dysphagia 1 (puree) nectar thick liquid based on speech therapy evaluation  Anemia of chronic disease No signs of active bleeding.  Stable,  Check cbc in am    Code Status : FULL CODE  Family Communication  : Discussed with patient's wife and entire extended family, patient has not improved neurologically over the last several days of this admission, do not anticipate any meaningful recovery,no stabilization of vital signs ,  wife feels that the patient' cannot be left to die, making him a DNR would be like killing him, if his vital signs are abnormal, she would like to bring him back,. Suggested that patient is not a candidate for intubation in the future, recommended DNR/DNI, at this point they have declined palliative care recommendations as well as recommendations multiple physicians consulting on the patient. He remains at high risk of aspiration, reintubation, readmission,his vitals change every day , he is unstable on daily basis, high risk for readmission. Therefore I have requested case manager to discuss with  administration about how to handle this situation    Disposition Plan  : SNF [facility], anticipate discharge tomorrow if no recurrent hypoglycemia overnight  Barriers For Discharge : abnormal vitals , CBG's   Consults  :  Cardiology, neurology,endocrinology,  Procedures  :   DVT Prophylaxis  :  Lovenox -  SCDs  Lab Results  Component Value Date   PLT 214 07/07/2016    Antibiotics  :  Vanc/zosyn 7/13-7/22     Objective:   Vitals:   07/06/16 2143 07/07/16 0524 07/07/16 1158 07/08/16 0525  BP: 118/65 122/73  96/70 (!) 159/93  Pulse: (!) 57 (!) 45 72 (!) 53  Resp: '16 16 12 17  '$ Temp: (!) 93.3 F (34.1 C) (!) 94.7 F (34.8 C) 98.4 F (36.9 C) 97.5 F (36.4 C)  TempSrc: Rectal Rectal Oral Axillary  SpO2: 96% 99% 93% 100%  Weight:    65.3 kg (144 lb)  Height:        Wt Readings from Last 3 Encounters:  07/08/16 65.3 kg (144 lb)  05/30/16 55.4 kg (122 lb 2.2 oz)  05/09/16 55.8 kg (123 lb)     Intake/Output Summary (Last 24 hours) at 07/08/16 1042 Last data filed at 07/08/16 0525  Gross per 24 hour  Intake          1679.67 ml  Output             3225 ml  Net         -1545.33 ml     Physical Exam  Awake Alert, Oriented X ? No new F.N deficits, Normal affect [place].AT,PERRAL Supple Neck,No JVD, No cervical lymphadenopathy appriciated.  Symmetrical Chest wall movement, Good air movement bilaterally, CTAB RRR,No Gallops,Rubs or new Murmurs, No Parasternal Heave +ve B.Sounds, Abd Soft, No tenderness, No organomegaly appriciated, No rebound - guarding or rigidity. No Cyanosis, Clubbing or edema, No new Rash or bruise  Pt able to move all 4 ext, pt able to sit up in bed    Data Review:    CBC  Recent Labs Lab 07/02/16 0319 07/04/16 0212 07/06/16 0322 07/07/16 0326  WBC 8.2 10.1 8.2 5.9  HGB 8.8* 8.1* 9.3* 9.1*  HCT 27.7* 25.9* 29.4* 28.9*  PLT 319 283 247 214  MCV 89.9 89.9 90.2 88.9  MCH 28.6 28.1 28.5 28.0  MCHC 31.8 31.3 31.6 31.5  RDW 17.2* 17.6* 17.9* 17.4*    Chemistries   Recent Labs Lab 07/02/16 0319 07/04/16 0212 07/06/16 0322 07/07/16 0326  NA 144 147* 146* 144  K 4.6 3.6  3.8 3.1*  CL 114* 114* 107 105  CO2 '24 28 29 '$ 32  GLUCOSE 102* 91 79 105*  BUN '14 11 9 9  '$ CREATININE 0.89 0.79 0.76 0.76  CALCIUM 8.1* 8.1* 8.3* 8.3*  AST 19 12* 13* 13*  ALT 16* 12* 12* 14*  ALKPHOS 49 44 48 50  BILITOT 0.5 0.3 0.2* 0.1*   ------------------------------------------------------------------------------------------------------------------ No results for input(s):  CHOL, HDL, LDLCALC, TRIG, CHOLHDL, LDLDIRECT in the last 72 hours.  Lab Results  Component Value Date   HGBA1C 5.3 05/20/2016   ------------------------------------------------------------------------------------------------------------------ No results for input(s): TSH, T4TOTAL, T3FREE, THYROIDAB in the last 72 hours.  Invalid input(s): FREET3 ------------------------------------------------------------------------------------------------------------------ No results for input(s): VITAMINB12, FOLATE, FERRITIN, TIBC, IRON, RETICCTPCT in the last 72 hours.  Coagulation profile No results for input(s): INR, PROTIME in the last 168 hours.  No results for input(s): DDIMER in the last 72 hours.  Cardiac Enzymes No results for input(s): CKMB, TROPONINI, MYOGLOBIN in the last 168 hours.  Invalid input(s): CK ------------------------------------------------------------------------------------------------------------------    Component Value Date/Time   BNP 23.8 03/20/2016 1800    Inpatient Medications  Scheduled Meds: . aspirin  325 mg Per Tube Daily  . atorvastatin  20 mg Oral q1800  . bethanechol  10 mg Oral Q8H  . chlorhexidine gluconate (SAGE KIT)  15 mL Mouth Rinse BID  . divalproex  750 mg Oral Q8H  . enoxaparin (LOVENOX) injection  40 mg Subcutaneous Q24H  . fludrocortisone  0.1 mg Oral Daily  . folic acid  1 mg Oral Daily  . hydrocortisone  80 mg Oral [id number]  . insulin aspart  0-15 Units Subcutaneous TID WC  . insulin aspart  0-5 Units Subcutaneous QHS  . levETIRAcetam  1,500 mg Oral BID  . potassium chloride  40 mEq Oral BID  . thiamine  100 mg Oral Daily   Continuous Infusions: . dextrose 40 mL/hr at 07/08/16 0625   PRN Meds:.LORazepam, RESOURCE THICKENUP CLEAR, sodium chloride  Micro Results Recent Results (from the past 240 hour(s))  Culture, blood (routine x 2)     Status: None   Collection Time: 06/30/16 10:00 AM  Result Value Ref Range Status    Specimen Description BLOOD RIGHT ARM  Final   Special Requests BOTTLES DRAWN AEROBIC AND ANAEROBIC 10CC  Final   Culture NO GROWTH 5 DAYS  Final   Report Status 07/05/2016 FINAL  Final  Culture, blood (routine x 2)     Status: None   Collection Time: 06/30/16 10:10 AM  Result Value Ref Range Status   Specimen Description BLOOD RIGHT HAND  Final   Special Requests   Final    BOTTLES DRAWN AEROBIC AND ANAEROBIC 10CC AER [place] ANA   Culture NO GROWTH 5 DAYS  Final   Report Status 07/05/2016 FINAL  Final    Radiology Reports Dg Pelvis 1-2 Views  Result Date: 07/06/2016 CLINICAL DATA:  Fall EXAM: PELVIS - 1-2 VIEW COMPARISON:  None. FINDINGS: No fracture or dislocation is seen. Follow hip joint spaces are preserved. Visualized bony pelvis appears intact. IMPRESSION: No fracture or dislocation is seen. Electronically Signed   By: Julian Hy M.D.   On: 07/06/2016 20:32   Dg Abd 1 View  Result Date: 06/16/2016 CLINICAL DATA:  Orogastric tube placement. EXAM: ABDOMEN - 1 VIEW COMPARISON:  05/25/2016 FINDINGS: An orogastric tube is now seen with tip overlying the distal stomach in the expected region of the pylorus. A left femoral central venous catheter is seen in place as  well as a temperature probe within the bladder. The bowel gas pattern is normal. IMPRESSION: Orogastric tube tip overlies the distal stomach, in expected region of pylorus. Electronically Signed   By: Earle Gell M.D.   On: 06/16/2016 20:50   Ct Head Wo Contrast  Result Date: 07/06/2016 CLINICAL DATA:  Fall from bed EXAM: CT HEAD WITHOUT CONTRAST TECHNIQUE: Contiguous axial images were obtained from the base of the skull through the vertex without intravenous contrast. COMPARISON:  06/29/2016 FINDINGS: Hypodensity along the left inferior caudate (series 2/ image 14 ; coronal image 30), new from the prior, worrisome for acute/subacute infarct. Adjacent chronic left basal ganglia lacunar infarcts. No evidence of parenchymal  hemorrhage or extra-axial fluid collection. No mass lesion, mass effect, or midline shift. Subcortical white matter and periventricular small vessel ischemic changes. Mild global cortical atrophy.  No ventriculomegaly. Partial opacification of the bilateral maxillary and left sphenoid sinuses. Mastoid air cells are clear. No evidence of calvarial fracture. IMPRESSION: Acute versus subacute infarct involving the left inferior caudate, new from 06/29/2016. No evidence of traumatic injury. These results will be called to the ordering clinician or representative by the Radiologist Assistant, and communication documented in the PACS or zVision Dashboard. Electronically Signed   By: Julian Hy M.D.   On: 07/06/2016 20:25   Ct Head Wo Contrast  Result Date: 06/29/2016 CLINICAL DATA:  Altered mental status EXAM: CT HEAD WITHOUT CONTRAST TECHNIQUE: Contiguous axial images were obtained from the base of the skull through the vertex without intravenous contrast. COMPARISON:  MRI brain dated 06/21/2016 FINDINGS: No evidence of parenchymal hemorrhage or extra-axial fluid collection. No mass lesion, mass effect, or midline shift. No CT evidence of acute infarction. Lacunar infarcts in the left corona radiata and basal ganglia. Subcortical white matter and periventricular small vessel ischemic changes. Cerebral volume is within normal limits.  No ventriculomegaly. New complete opacification of the bilateral maxillary sinuses. Partial opacification of the bilateral ethmoid and sphenoid sinuses. Partial opacification of the right mastoid air cells. No evidence of calvarial fracture. IMPRESSION: No evidence of acute intracranial abnormality. Lacunar infarcts in the left basal ganglia and corona radiata. Electronically Signed   By: Julian Hy M.D.   On: 06/29/2016 16:31  Ct Head Wo Contrast  Result Date: 06/17/2016 CLINICAL DATA:  Unresponsive EXAM: CT HEAD WITHOUT CONTRAST TECHNIQUE: Contiguous axial images were  obtained from the base of the skull through the vertex without intravenous contrast. COMPARISON:  05/19/2016 FINDINGS: The bony calvarium is intact. Increased mucosal thickening is noted within the ethmoid and sphenoid sinus. Air-fluid levels are noted within the maxillary antra bilaterally consistent with a more acute degree of sinusitis. Lacunar infarct is noted within the left basal ganglia and left thalamus extending into the deep white matter new from the prior exam. No findings to suggest acute hemorrhage are noted. IMPRESSION: New areas of subacute to chronic ischemia on the left. Progression in the degree of sinusitis as described. Electronically Signed   By: Inez Catalina M.D.   On: 06/17/2016 13:06   Mr Jeri Cos IR Contrast  Result Date: 06/21/2016 CLINICAL DATA:  Initial evaluation for recurrent altered mental status, seizures, history of strep pneumo meningitis. Question new areas of and within the left basal ganglia. EXAM: MRI HEAD WITHOUT AND WITH CONTRAST TECHNIQUE: Multiplanar, multiecho pulse sequences of the brain and surrounding structures were obtained without and with intravenous contrast. CONTRAST:  2m MULTIHANCE GADOBENATE DIMEGLUMINE 529 MG/ML IV SOLN COMPARISON:  Comparison made with prior CT from 06/17/2016  as well as previous MRI from 03/24/2016. FINDINGS: Diffuse prominence of the CSF containing spaces compatible with generalized cerebral atrophy. There has been normal interval expected evolution of previously identified acute ischemic infarct involving the left corona radiata/basal ganglia, which now demonstrates progressive encephalomalacia without significant diffusion abnormality. Question minimal faint enhancement within this region extending towards the left cerebral peduncle, likely related to the late subacute ischemia (series 11, image 28). Additional remote infarctions involving the bilateral basal ganglia again seen. Chronic blood products again noted within the left  external capsule. No new areas of restricted diffusion to suggest new ischemia or injury. Similar FLAIR signal abnormality at the right parietal cortex. Associated subtle laminar necrosis within this region (series 3, image 6). No significant enhancement now seen within this region. Additional focus of cortical flares abnormality within the right frontal lobe slightly more prominent as compared to previous exam, although this appears to be chronic in nature. No appreciable enhancement within these areas. No other abnormal enhancement elsewhere within the brain. Major intracranial vascular flow voids are preserved. No mass lesion, mass effect, or midline shift. Layering susceptibility artifact within the stent was of both lateral ventricles, likely debris/chronic hemorrhage related to prior meningitis, similar to prior. No hydrocephalus. Craniocervical junction within normal limits. Visualized upper cervical spine unremarkable. Pituitary gland normal. No acute abnormality about the globes and orbits. Extensive opacity throughout the paranasal sinuses with fluid levels within the sphenoid and maxillary sinuses. Bilateral mastoid effusions. Fluid within the nasopharynx. Patient likely intubated. Bone marrow signal intensity within normal limits. No scalp soft tissue abnormality. IMPRESSION: 1. Normal expected interval evolution of previously identified infarct involving the left basal ganglia/corona radiata. Additionally, previously seen signal abnormalities within the bilateral basal ganglia have also largely resolved relative to previous MRI from 03/24/2016. Similarly, changes at the right parietal cortex have also evolved. No new areas of ischemia or injury identified. 2. Persistent layering debris/hemorrhage within the lateral ventricles, likely related to recent history of meningitis. No hydrocephalus. Electronically Signed   By: Jeannine Boga M.D.   On: 06/21/2016 04:55   Dg Chest Port 1 View  Result  Date: 07/05/2016 CLINICAL DATA:  Altered mental status EXAM: PORTABLE CHEST 1 VIEW COMPARISON:  06/30/2016 FINDINGS: Stable hazy appearance of the bases which is likely combination of atelectasis and pleural fluid. No edema, air bronchograms, or pneumothorax. Normal heart size and mediastinal contours. IMPRESSION: Stable appearance of the chest. Hazy basilar opacities again favor small effusions and atelectasis. Electronically Signed   By: Monte Fantasia M.D.   On: 07/05/2016 09:19   Dg Chest Port 1 View  Result Date: 06/30/2016 CLINICAL DATA:  Altered mental status.  Low body temperature. EXAM: PORTABLE CHEST 1 VIEW COMPARISON:  06/25/2016 FINDINGS: Lungs are adequately inflated as patient is slightly rotated to the right. Interval resolution of left base opacification with improvement but mild residual hazy opacification in the right base possibly due to small layering effusions/atelectasis, although cannot exclude infection cardiomediastinal silhouette and remainder of the exam is unchanged. IMPRESSION: Interval improvement with mild residual hazy opacification in the right base likely small layering effusions/atelectasis although cannot exclude infection. Electronically Signed   By: Marin Olp M.D.   On: 06/30/2016 08:56  Dg Chest Port 1 View  Result Date: 06/25/2016 CLINICAL DATA:  Pneumonia. EXAM: PORTABLE CHEST 1 VIEW COMPARISON:  06/22/2016. FINDINGS: Interval extubation. Left IJ central line tip projects over the SVC. Heart size normal. Left lower lobe collapse/ consolidation. Hazy opacification over the lower hemithoraces bilaterally, new  on the right. IMPRESSION: 1. Left lower lobe collapse/consolidation. 2. Hazy opacification over both lower hemithoraces may be due to layering pleural fluid and/or true airspace opacification. Electronically Signed   By: Lorin Picket M.D.   On: 06/25/2016 10:13   Dg Chest Port 1 View  Result Date: 06/22/2016 CLINICAL DATA:  Respiratory failure, acute  encephalopathy, cardiogenic shock, acute and chronic CHF, current smoker. EXAM: PORTABLE CHEST 1 VIEW COMPARISON:  Portable chest x-ray of June 19, 2016 FINDINGS: The right lung is adequately inflated and clear. On the left there is persistent basilar atelectasis or pneumonia with small pleural effusion. The heart is normal in size. The pulmonary vascularity is not engorged. The endotracheal tube tip lies 5.3 cm above the carina. The esophagogastric tube tip projects below the inferior margin of the image. The left internal jugular venous catheter tip projects over the proximal third of the SVC. IMPRESSION: Persistent left lower lobe atelectasis or pneumonia with trace pleural effusion. No CHF. The support tubes are in stable position. Electronically Signed   By: David  Martinique M.D.   On: 06/22/2016 07:20   Dg Chest Portable 1 View  Result Date: 06/19/2016 CLINICAL DATA:  Respiratory failure EXAM: PORTABLE CHEST 1 VIEW COMPARISON:  06/18/2016 FINDINGS: ET tube tip is above the carina. There is a left IJ catheter with tip in the projection of the SVC. Enteric tube tip is below the hemidiaphragm. Persistent opacity within the left lung base. Right lung is clear. IMPRESSION: 1. Stable position of the support apparatus. 2. No change in left base opacity. Electronically Signed   By: Kerby Moors M.D.   On: 06/19/2016 07:44   Dg Chest Port 1 View  Result Date: 06/18/2016 CLINICAL DATA:  50 year old male with a history of respiratory failure EXAM: PORTABLE CHEST 1 VIEW COMPARISON:  06/17/2016, 06/16/2016 FINDINGS: Cardiomediastinal silhouette unchanged in size and contour. Endotracheal tube unchanged, terminating 3.8 cm above the carina. Unchanged left IJ approach central venous catheter, appearing to terminate superior vena cava. Gastric tube projects over the mediastinum, terminating out of the field of view. No visualized pneumothorax. Improved aeration in the left mid lung, with persistent opacity in the  retrocardiac region. Blunting of left costophrenic angle. IMPRESSION: Improved aeration of the left lung, with persistent left basilar opacity, potentially consolidation, edema, and/ or atelectasis. Small left pleural effusion not excluded. Unchanged position of endotracheal tube, gastric tube, left IJ central venous catheter. Signed, Dulcy Fanny. Earleen Newport, DO [facility] Electronically Signed   By: Corrie Mckusick D.O.   On: 06/18/2016 10:15   Dg Chest Port 1 View  Result Date: 06/17/2016 CLINICAL DATA:  50 year old male with enteric tube placement. EXAM: PORTABLE CHEST 1 VIEW COMPARISON:  A abdominal radiograph dated 06/16/2016 FINDINGS: An endotracheal tube is noted with tip approximately 3 cm above the carina. Single portable view of the chest demonstrates patchy area of hazy airspace opacity in the left mid to lower lung field. There is no pleural effusion or pneumothorax. The cardiac silhouette is within normal limits. Left IJ central line with tip over central SVC. An enteric tube extends into the epigastric area with tip superimposed over the right L3 pedicle likely in the distal stomach. There is no bowel dilatation or evidence of obstruction. No free air. No radiopaque calculi or foreign object. The soft tissues and osseous structures appear unremarkable. The previously seen left femoral central venous catheter has been removed. A temperature probe is partially visualized over the bladder. IMPRESSION: Endotracheal  tube above the carina. Patchy area of airspace density in the left mid to lower lung field. Follow-up recommended. Enteric tube in the distal stomach.  No bowel obstruction. Electronically Signed   By: Anner Crete M.D.   On: 06/17/2016 02:48   Dg Chest Port 1 View  Result Date: 06/16/2016 CLINICAL DATA:  Endotracheal tube placement EXAM: PORTABLE CHEST 1 VIEW COMPARISON:  06/16/2016 FINDINGS: There is an endotracheal tube with the  tip 3.7 cm above the carina. Nasogastric tube coursing below the diaphragm. There is hazy left lower lobe airspace disease. There is no pleural effusion or pneumothorax. The heart and mediastinal contours are unremarkable. The osseous structures are unremarkable. IMPRESSION: 1. Hazy left lower lobe airspace disease concerning for pneumonia. 2. Endotracheal tube with the tip 3.7 cm above the carina. Electronically Signed   By: Kathreen Devoid   On: 06/16/2016 22:41   Dg Chest Port 1 View  Result Date: 06/16/2016 CLINICAL DATA:  Intubation.  Followup pneumonia. EXAM: PORTABLE CHEST 1 VIEW 8:24 p.m.: COMPARISON:  Portable chest x-ray earlier same date 5:49 p.m. and previously. FINDINGS: Endotracheal tube tip in satisfactory position projecting approximately 6 cm above the carina. Nasogastric tube courses below the diaphragm into the stomach though its tip is not included on the image. Consolidation in the left lower lobe as noted earlier. Developing patchy opacities at the right lung base. IMPRESSION: 1. Support apparatus satisfactory. Endotracheal tube tip approximately 6 cm above the carina. Nasogastric tube courses below the diaphragm into the stomach. 2. Left lower lobe pneumonia as noted earlier. Developing atelectasis and/or pneumonia involving the right lung base. Electronically Signed   By: Evangeline Dakin M.D.   On: 06/16/2016 20:49   Dg Chest Port 1 View  Result Date: 06/16/2016 CLINICAL DATA:  50 year old with possible aspiration and sepsis. EXAM: PORTABLE CHEST 1 VIEW COMPARISON:  05/29/2016, 05/27/2016 and earlier. FINDINGS: Patient is rotated to the left. Cardiomediastinal silhouette unremarkable, unchanged. Airspace consolidation in the retrocardiac left lung base. Lungs otherwise clear. Pulmonary vascularity normal. No visible pleural effusions. IMPRESSION: Left lower lobe pneumonia. Electronically Signed   By: Evangeline Dakin M.D.   On: 06/16/2016 18:22   Dg Abd Portable 1v  Result Date:  06/17/2016 CLINICAL DATA:  50 year old male with enteric tube placement. EXAM: PORTABLE CHEST 1 VIEW COMPARISON:  A abdominal radiograph dated 06/16/2016 FINDINGS: An endotracheal tube is noted with tip approximately 3 cm above the carina. Single portable view of the chest demonstrates patchy area of hazy airspace opacity in the left mid to lower lung field. There is no pleural effusion or pneumothorax. The cardiac silhouette is within normal limits. Left IJ central line with tip over central SVC. An enteric tube extends into the epigastric area with tip superimposed over the right L3 pedicle likely in the distal stomach. There is no bowel dilatation or evidence of obstruction. No free air. No radiopaque calculi or foreign object. The soft tissues and osseous structures appear unremarkable. The previously seen left femoral central venous catheter has been removed. A temperature probe is partially visualized over the bladder. IMPRESSION: Endotracheal tube above the carina. Patchy area of airspace density in the left mid to lower lung field. Follow-up recommended. Enteric tube in the distal stomach.  No bowel obstruction. Electronically Signed   By: Anner Crete M.D.   On: 06/17/2016 02:48   Dg Swallowing Func-speech Pathology  Result Date: 06/29/2016 Objective Swallowing Evaluation: Type of Study: MBS-Modified Barium Swallow Study Patient Details Name: KELSON QUEENAN MRN: [id number]  Date of Birth: 1966/01/05 Today's Date: 06/29/2016 Time: SLP Start Time (ACUTE ONLY): 1215-SLP Stop Time (ACUTE ONLY): 1235 SLP Time Calculation (min) (ACUTE ONLY): 20 min Past Medical History: Past Medical History: Diagnosis Date . Acute encephalopathy  . Acute ischemic stroke ([place])  . Acute renal failure ([place])  . Acute respiratory failure with hypoxia ([place])  . AKI (acute kidney injury) ([place]) 02/16/2016 . Altered mental status  . Cardiac arrest ([place])  . Cardiogenic shock ([place])  . 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 Cerebral thrombosis with  cerebral infarction 03/25/2016 . CHF (congestive heart failure) ([place])  . Convulsions/seizures ([place]) 05/04/2016  Seizure disorder - Continue Keppra, Depakote.    . Dementia 04/27/2016 . Depression  . Dysphagia 05/04/2016  Dysphagia - Dysphagia 1 diet per SLP    . ETOH abuse  . Hydrocephalus  . Hyperlipidemia 05/04/2016  Dyslipidemia - Continue statin.    Marland Kitchen Hypernatremia  . Hypothermia 03/20/2016 . Low serum cortisol level ([facility]) 05/04/2016  Low a.m. cortisol  - Was 1.2 checked on 04/03/2016  - This likely is reflective of cortisol suppression from steroid use - Tapered steroids off  . Meningitis, pneumococcal, recurrent  . Meningitis, streptococcal 02/16/2016 . Meningoencephalitis  . NSTEMI (non-ST elevated myocardial infarction) ([place]) 03/14/2016 . Protein-calorie malnutrition, severe ([place])  . Respiratory failure ([place]) 03/03/2016 . Septic shock ([place])  . Severe sepsis ([place])  . 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 Substance abuse  . Systolic and diastolic CHF, chronic ([place])  . Tobacco abuse 02/16/2016 . Trichomonal urethritis in male 02/16/2016 . Urinary retention 05/04/2016  Urinary retention - Continue Flomax    Past Surgical History: Past Surgical History: Procedure Laterality Date . ANKLE SURGERY   . CARDIAC CATHETERIZATION N/A 02/17/2016  Procedure: IABP Insertion;  Surgeon: Wellington Hampshire, MD;  Location: [facility];  Service: Cardiovascular;  Laterality: N/A; . TEE WITHOUT CARDIOVERSION N/A 03/29/2016  Procedure: TRANSESOPHAGEAL ECHOCARDIOGRAM (TEE);  Surgeon: Larey Dresser, MD;  Location: [facility] ENDOSCOPY;  Service: Cardiovascular;  Laterality: N/A; HPI: 50 year old male with h/o strep pneumonia meningitis, dysphagia, prior trach (3/17),dementia and hospital course complicated by cardiac arrest and cardiogenic shock in March 2016, ARF, shock liver, adrenal insufficiency presented with acute encephalopathy June admission.  Re-admitted 7/13 again presenting with AMS thought to be septic/PNA in origin but had witnessed  seizure in ED.  SLP evaluations during previous admissions have recommended Dys 1 diet and nectar thick liquids with advancement to Dys 2 textures prior to discharge.  Intubated 7/13-21.  Subjective: Pt kept eyes closed during MBS. Appeared lethargic, but accepted po presentations Assessment / Plan / Recommendation CHL IP CLINICAL IMPRESSIONS 06/29/2016 Therapy Diagnosis Mild oral phase dysphagia;Moderate pharyngeal phase dysphagia Clinical Impression Pt presents with mild oral, moderate pharyngeal phase dysphagia, characterized by poor bolus formation and posterior propulsion, resulting in posterior spillage to the vallecula on nectar, honey, and puree consistencies, and at the pyriform sinus on thin liquids. Silent aspiration was noted during the swallow of thin liquids. No penetration  or aspiration was observed on nectar, honey, or puree consistencies, however, risk of episodic aspiration of all consistencies is possible, given poor oral prep and delayed swallow reflex. Solid consistencies were not provided at this time, due to extended ineffective oral bolus formation and propulsion and associated risk of aspiration with fatigue.  Recommend Puree diet and nectar thick liquids with full supervision. Pt needs to be monitored closely during meals to insure oral cavity is clear before provided the next bolus. Safe swallow precautions were  sent back to room with pt. Impact on safety and function Moderate aspiration risk   CHL IP TREATMENT RECOMMENDATION 06/29/2016 Treatment Recommendations Therapy as outlined in treatment plan below   Prognosis 06/29/2016 Prognosis for Safe Diet Advancement Fair Barriers to Reach Goals Cognitive deficits CHL IP DIET RECOMMENDATION 06/29/2016 SLP Diet Recommendations Dysphagia 1 (Puree) solids;Nectar thick liquid Liquid Administration via Straw;Cup Medication Administration Crushed with puree Compensations Slow rate;Small sips/bites;Minimize environmental distractions Postural Changes  Remain semi-upright after after feeds/meals (Comment);Seated upright at 90 degrees   CHL IP OTHER RECOMMENDATIONS 06/29/2016 Recommended Consults -- Oral Care Recommendations Oral care BID Other Recommendations Order thickener from pharmacy;Prohibited food (jello, ice cream, thin soups);Remove water pitcher   CHL IP FOLLOW UP RECOMMENDATIONS 06/29/2016 Follow up Recommendations Skilled Nursing facility   [facility] IP FREQUENCY AND DURATION 06/29/2016 Speech Therapy Frequency (ACUTE ONLY) min 2x/week Treatment Duration 2 weeks    CHL IP ORAL PHASE 06/29/2016 Oral Phase Impaired Oral - Honey Teaspoon Premature spillage;Reduced posterior propulsion Oral - Nectar Teaspoon Premature spillage;Reduced posterior propulsion;Delayed oral transit Oral - Nectar Straw Premature spillage;Reduced posterior propulsion;Delayed oral transit Oral - Thin Straw Premature spillage;Reduced posterior propulsion;Delayed oral transit Oral - Puree Premature spillage;Delayed oral transit;Reduced posterior propulsion  CHL IP CERVICAL ESOPHAGEAL PHASE 06/29/2016 Cervical Esophageal Phase [facility] Shonna Chock 06/29/2016, 1:27 PM Celia B. [place]ue[place], [facility], CCC-SLP [phone]               Time Spent in minutes  34   Nilza Eaker M.D on 07/08/2016 at 10:42 AM  Between 7am to 7pm - Pager - [phone]  After 7pm go to [email or web address] - password [facility]  [facility] -  Office  [phone]

## 2016-07-08 NOTE — Progress Notes (Signed)
SLP Cancellation Note  Patient Details Name: Dale [name] MRN: [id number] DOB: 10-18-1966   Cancelled treatment:       Reason Eval/Treat Not Completed: Fatigue/lethargy limiting ability to participate; nursing stated he appears to be tolerating current diet well of Dysphagia 1/nectar-thickened liquids   Hulda Reddix,PAT, M.S., CCC-SLP 07/08/2016, 12:51 PM

## 2016-07-09 LAB — CBC
HCT: 28 % — ABNORMAL LOW (ref 39.0–52.0)
Hemoglobin: 8.6 g/dL — ABNORMAL LOW (ref 13.0–17.0)
MCH: 28 pg (ref 26.0–34.0)
MCHC: 30.7 g/dL (ref 30.0–36.0)
MCV: 91.2 fL (ref 78.0–100.0)
PLATELETS: 201 10*3/uL (ref 150–400)
RBC: 3.07 MIL/uL — AB (ref 4.22–5.81)
RDW: 17.8 % — AB (ref 11.5–15.5)
WBC: 8.6 10*3/uL (ref 4.0–10.5)

## 2016-07-09 LAB — COMPREHENSIVE METABOLIC PANEL
ALK PHOS: 48 U/L (ref 38–126)
ALT: 11 U/L — AB (ref 17–63)
AST: 14 U/L — AB (ref 15–41)
Albumin: 2.3 g/dL — ABNORMAL LOW (ref 3.5–5.0)
Anion gap: 8 (ref 5–15)
BILIRUBIN TOTAL: 0.4 mg/dL (ref 0.3–1.2)
BUN: 10 mg/dL (ref 6–20)
CALCIUM: 8.6 mg/dL — AB (ref 8.9–10.3)
CHLORIDE: 104 mmol/L (ref 101–111)
CO2: 36 mmol/L — ABNORMAL HIGH (ref 22–32)
CREATININE: 0.76 mg/dL (ref 0.61–1.24)
Glucose, Bld: 74 mg/dL (ref 65–99)
Potassium: 3.4 mmol/L — ABNORMAL LOW (ref 3.5–5.1)
Sodium: 148 mmol/L — ABNORMAL HIGH (ref 135–145)
TOTAL PROTEIN: 5.1 g/dL — AB (ref 6.5–8.1)

## 2016-07-09 LAB — GLUCOSE, CAPILLARY
GLUCOSE-CAPILLARY: 57 mg/dL — AB (ref 65–99)
GLUCOSE-CAPILLARY: 72 mg/dL (ref 65–99)
Glucose-Capillary: 132 mg/dL — ABNORMAL HIGH (ref 65–99)
Glucose-Capillary: 93 mg/dL (ref 65–99)
Glucose-Capillary: 55 mg/dL — ABNORMAL LOW (ref 65–99)

## 2016-07-09 LAB — MAGNESIUM: Magnesium: 2 mg/dL (ref 1.7–2.4)

## 2016-07-09 LAB — AMMONIA: AMMONIA: 21 umol/L (ref 9–35)

## 2016-07-09 LAB — VALPROIC ACID LEVEL: VALPROIC ACID LVL: 88 ug/mL (ref 50.0–100.0)

## 2016-07-09 MED ORDER — DEXTROSE 50 % IV SOLN
50.0000 mL | Freq: Once | INTRAVENOUS | Status: AC
  Administered 2016-07-09: 50 mL via INTRAVENOUS

## 2016-07-09 MED ORDER — DEXTROSE 50 % IV SOLN
INTRAVENOUS | Status: AC
Start: 2016-07-09 — End: 2016-07-09
  Filled 2016-07-09: qty 50

## 2016-07-09 NOTE — Progress Notes (Signed)
Patient ID: Dale [name], male   DOB: [birth year], 50 y.o.   MRN: [id number]                                                                PROGRESS NOTE                                                                                                                                                                                                             Patient Demographics:    Dale [name], is a 50 y.o. male, DOB - May 03, 1966, JTT:[id number]  Admit date - 06/16/2016   Admitting Physician Brand Males, MD  Outpatient Primary MD for the patient is No primary care provider on file.  LOS - 23  Outpatient Specialists:  Chief Complaint  Patient presents with  . Aspiration  . Possible Sepsis        Brief Narrative  50 year old male with complex medical history including:  3/14-4/6: Admitted with strep pneumo meningitis, c/b cardiac arrest, and cardiogenic shock (EF 20%, recovered to 50%) briefly on amiodarone, ARDS requiring prolonged intubation and trach (now removed), ARF, shock liver, and adrenal insufficiency. He was discharged on 4/6 with weekly Pen G IM until 4/10. Has been re-admitted several times w/ recurrent infection and seen by ID. Re-admitted 7/13 again presenting with AMS thought to be septic/PNA in origin but had witnessed seizure in ED. He was found to be hypotensive and hypothermic, however WBC and Lactic were wnl. CXR with LLL opacification He required CVL placement and vasopressors for shock despite aggressive IVF resuscitation. ICU admission for shock treatment and continuous EEG. He was intubated in the ED for airway protection and transferred to [facility]. Extubated 7/21. Transferred to [facility] on 7/25.Patient develops signs of adrenal insufficiency again on 7/26 and had to be moved back to stepdown   Subjective:    Dale [name]  unchanged, left lateral gaze which has been there for several days , but more awake today,Sitter at bedside . Continues to have hypothermia  hypoglycemic episodes   Assessment  & Plan :    Active Problems:   Acute encephalopathy   Acute respiratory failure (HCC)   Acute respiratory failure with hypercapnia ([place])   Encounter for central line placement   Pressure ulcer   Hypoglycemia  Bradycardia   Adrenal insufficiency (Addison's disease) ([place])   Hypoglycemia due to adrenal insufficiency Unable to tolerate weaning of steroids,now on steroids per endocrine ,Continue 100 mg TID,PO Hold D10 infusion and monitor CBGs, continues to have hypoglycemic episodes Feeding every 4 hours orders in place  Monitor CBGs on increased dose of steroids and frequent feeding and see what happens Suspect hypothalamic dysregulation, ACTH deficiency, endocrinology does not think that hypothermia and hypoglycemia is connected with adrenal insufficiency,  ACTH levels pending  Bradycardia >currently sinus in 40's-80's H/O Cardiac Arrest w/ NSTEMI H/O Systolic CHF - EF 45 - 70% per echo from June 2017 H/O Hyperlipidemia Continue aspirin and Lipitor Cardiology was consulted for bradycardia, sinus bradycardia with narrow QRS complex, no need for pacemaker, avoid AV nodal blocking drugs   Acute Encephalopathy - ongoing. Seems to be slowly improving. Occasionally lethargic. Following commands. Waxing and waning mental status, repeat CT scan on 7/26 did not show any evidence of acute intracranial abnormality. Lacunar infarcts in the left basal ganglia present on CT scan on 7/14 Patient has been evaluated extensively by neurology during this admission, without any improvement in his mental status, no trauma on CT from 8/2 from the fall yesterday   Adrenal insufficiency-manifesting as hypothermia hypoglycemia hypotension-consulted Dr. Dwyane Dee from endocrinology Has not been able to tolerate oral hydrocortisone thus far frequently switching back to IV hydrocortisone,  Cortisol level was 4.3 on admission, was 1.2 on 4/17  Seizures. -strep pneumo  meningitis earlier this year, Continue with Keppra and Depakote, dose adjusted during this hospitalization, continue prn Ativan for seizures,Erma appears to be waxing and waning.He has no evidence of any ongoing seizure . Depakote level therapeutic    New Subacute Ischemia - extensive workup by neurology as follows H/O strep pneumo meningitis, hydrocephalus, new CVA.Seizure disorder Neurology suspectsmild left upper extremity weakness related to the left basal ganglia infarction.  CT head 7/14:New areas of subacute to chronic ischemia on the left. Progression in the degree of sinusitis as described. EEG 7/14:This was an abnormal EEG due to the presence of moderate background slowing. This slowing is non-specific and is indicative of encephalopathy. MRI 7/18:1. Normal expected interval evolution of previously identified infarct involving the left basal ganglia/corona radiata. Additionally, previously seen signal abnormalities within the bilateral basal ganglia have also largely resolved relative to previous MRI from 03/24/2016. Similarly, changes at the right parietal cortex have also evolved. No new areas of ischemia orinjury identified.  Persistent layering debris/hemorrhage within the lateral ventricles, likely related to recent history of meningitis. No Hydrocephalus. Continuous EEG 7/20: Mild diffuse encephalopathy. No electrographic seizures. Patient remains at high-risk for aspiration with waxing and waning mental status   Sepsis - Possible HCAP-->completed abx course  h/O strep pneumo meningitis (march 2017). Received repeat doses empiric vanco and zosyn from 7/13 to 7/22 due to hypothermia, cx's performed No evidence of any underlying infection at this time   Acute Hypoxic Respiratory Failure Unable to Protect Airway - In setting of seizures.Intubated from 7/13-7/21 HCAP , chest x-ray on 7/22 showed left lower lobe collapse, chest x-ray on 7/27 showed interval  improvement with residual hazy opacification in the right base H/O Tracheostomy March 2017 - S/P Decannulation. Mobilize-will need rehabilitation Pulm hygiene   Shock - Resolved. Possibly secondary to sepsis.   Hypokalemia - resolved  Hypernatremia/Hyperchloremia - Mild. Continue to encourage by mouth intake  Urinary retention - Foley replaced.,  Trending UOP with Foley. Bethanechol started 7/18.Continue Flomax  Dysphagia  Dysphagia 1 (puree) nectar thick  liquid based on speech therapy evaluation   Anemia of chronic disease No signs of active bleeding.  Stable,  Check cbc in am    Code Status : FULL CODE  Family Communication  : Discussed with patient's wife and entire extended family, patient has not improved neurologically over the last several days of this admission, do not anticipate any meaningful recovery,no stabilization of vital signs ,  wife feels that the patient' cannot be left to die, making him a DNR would be like killing him, if his vital signs are abnormal, she would like to bring him back,. Suggested that patient is not a candidate for intubation in the future, recommended DNR/DNI, at this point they have declined palliative care recommendations as well as recommendations multiple physicians consulting on the patient. He remains at high risk of aspiration, reintubation, readmission,his vitals change every day , he is unstable on daily basis, high risk for readmission. Therefore I have requested case manager to discuss with  administration about how to handle this situation    Disposition Plan  : SNF [facility], anticipate discharge tomorrow if no recurrent hypoglycemia overnight  Barriers For Discharge : abnormal vitals , CBG's , we will see if the patient is a candidate for [facility]  Consults  :  Cardiology, neurology,endocrinology,  Procedures  :   DVT Prophylaxis  :  Lovenox -  SCDs  Lab Results  Component Value Date   PLT 201 07/09/2016     Antibiotics  :  Vanc/zosyn 7/13-7/22     Objective:   Vitals:   07/08/16 0525 07/08/16 1147 07/08/16 1957 07/09/16 0700  BP: (!) 159/93 119/60 (!) 148/79 (!) 154/76  Pulse: (!) 53 (!) 48 (!) 52 (!) 46  Resp: _0 Temp: 97.5 F (36.4 C) 97.6 F (36.4 C) 97 F (36.1 C) (!) 94.3 F (34.6 C)  TempSrc: Axillary Axillary Axillary Rectal  SpO2: 100% 100% 100% 100%  Weight: 65.3 kg (144 lb)   64.9 kg (143 lb)  Height:        Wt Readings from Last 3 Encounters:  07/09/16 64.9 kg (143 lb)  05/30/16 55.4 kg (122 lb 2.2 oz)  05/09/16 55.8 kg (123 lb)     Intake/Output Summary (Last 24 hours) at 07/09/16 0850 Last data filed at 07/09/16 0836  Gross per 24 hour  Intake              960 ml  Output              600 ml  Net              360 ml     Physical Exam  Awake Alert, Oriented X ? No new F.N deficits, Normal affect [place].AT,PERRAL Supple Neck,No JVD, No cervical lymphadenopathy appriciated.  Symmetrical Chest wall movement, Good air movement bilaterally, CTAB RRR,No Gallops,Rubs or new Murmurs, No Parasternal Heave +ve B.Sounds, Abd Soft, No tenderness, No organomegaly appriciated, No rebound - guarding or rigidity. No Cyanosis, Clubbing or edema, No new Rash or bruise  Pt able to move all 4 ext, pt able to sit up in bed    Data Review:    CBC  Recent Labs Lab 07/04/16 0212 07/06/16 0322 07/07/16 0326 07/09/16 0451  WBC 10.1 8.2 5.9 8.6  HGB 8.1* 9.3* 9.1* 8.6*  HCT 25.9* 29.4* 28.9* 28.0*  PLT 283 247 214 201  MCV 89.9 90.2 88.9 91.2  MCH 28.1 28.5 28.0 28.0  MCHC 31.3 31.6 31.5  30.7  RDW 17.6* 17.9* 17.4* 17.8*    Chemistries   Recent Labs Lab 07/04/16 0212 07/06/16 0322 07/07/16 0326 07/09/16 0451  NA 147* 146* 144 148*  K 3.6 3.8 3.1* 3.4*  CL 114* 107 105 104  CO2 28 29 32 36*  GLUCOSE 91 79 105* 74  BUN _0 CREATININE 0.79 0.76 0.76 0.76  CALCIUM 8.1* 8.3* 8.3* 8.6*  AST 12* 13* 13* 14*  ALT 12* 12* 14* 11*   ALKPHOS 44 48 50 48  BILITOT 0.3 0.2* 0.1* 0.4   ------------------------------------------------------------------------------------------------------------------ No results for input(s): CHOL, HDL, LDLCALC, TRIG, CHOLHDL, LDLDIRECT in the last 72 hours.  Lab Results  Component Value Date   HGBA1C 5.3 05/20/2016   ------------------------------------------------------------------------------------------------------------------ No results for input(s): TSH, T4TOTAL, T3FREE, THYROIDAB in the last 72 hours.  Invalid input(s): FREET3 ------------------------------------------------------------------------------------------------------------------ No results for input(s): VITAMINB12, FOLATE, FERRITIN, TIBC, IRON, RETICCTPCT in the last 72 hours.  Coagulation profile No results for input(s): INR, PROTIME in the last 168 hours.  No results for input(s): DDIMER in the last 72 hours.  Cardiac Enzymes No results for input(s): CKMB, TROPONINI, MYOGLOBIN in the last 168 hours.  Invalid input(s): CK ------------------------------------------------------------------------------------------------------------------    Component Value Date/Time   BNP 23.8 03/20/2016 1800    Inpatient Medications  Scheduled Meds: . aspirin  325 mg Per Tube Daily  . atorvastatin  20 mg Oral q1800  . bethanechol  10 mg Oral Q8H  . chlorhexidine gluconate (SAGE KIT)  15 mL Mouth Rinse BID  . divalproex  750 mg Oral Q8H  . enoxaparin (LOVENOX) injection  40 mg Subcutaneous Q24H  . fludrocortisone  0.1 mg Oral Daily  . folic acid  1 mg Oral Daily  . hydrocortisone  100 mg Oral TID WC  . insulin aspart  0-15 Units Subcutaneous TID WC  . insulin aspart  0-5 Units Subcutaneous QHS  . levETIRAcetam  1,500 mg Oral BID  . potassium chloride  40 mEq Oral BID  . thiamine  100 mg Oral Daily   Continuous Infusions:   PRN Meds:.LORazepam, RESOURCE THICKENUP CLEAR, sodium chloride, sodium chloride flush  Micro  Results Recent Results (from the past 240 hour(s))  Culture, blood (routine x 2)     Status: None   Collection Time: 06/30/16 10:00 AM  Result Value Ref Range Status   Specimen Description BLOOD RIGHT ARM  Final   Special Requests BOTTLES DRAWN AEROBIC AND ANAEROBIC 10CC  Final   Culture NO GROWTH 5 DAYS  Final   Report Status 07/05/2016 FINAL  Final  Culture, blood (routine x 2)     Status: None   Collection Time: 06/30/16 10:10 AM  Result Value Ref Range Status   Specimen Description BLOOD RIGHT HAND  Final   Special Requests   Final    BOTTLES DRAWN AEROBIC AND ANAEROBIC 10CC AER [place] ANA   Culture NO GROWTH 5 DAYS  Final   Report Status 07/05/2016 FINAL  Final    Radiology Reports Dg Pelvis 1-2 Views  Result Date: 07/06/2016 CLINICAL DATA:  Fall EXAM: PELVIS - 1-2 VIEW COMPARISON:  None. FINDINGS: No fracture or dislocation is seen. Follow hip joint spaces are preserved. Visualized bony pelvis appears intact. IMPRESSION: No fracture or dislocation is seen. Electronically Signed   By: Julian Hy M.D.   On: 07/06/2016 20:32   Dg Abd 1 View  Result Date: 06/16/2016 CLINICAL DATA:  Orogastric tube placement. EXAM: ABDOMEN - 1 VIEW COMPARISON:  05/25/2016  FINDINGS: An orogastric tube is now seen with tip overlying the distal stomach in the expected region of the pylorus. A left femoral central venous catheter is seen in place as well as a temperature probe within the bladder. The bowel gas pattern is normal. IMPRESSION: Orogastric tube tip overlies the distal stomach, in expected region of pylorus. Electronically Signed   By: Earle Gell M.D.   On: 06/16/2016 20:50   Ct Head Wo Contrast  Result Date: 07/06/2016 CLINICAL DATA:  Fall from bed EXAM: CT HEAD WITHOUT CONTRAST TECHNIQUE: Contiguous axial images were obtained from the base of the skull through the vertex without intravenous contrast. COMPARISON:  06/29/2016 FINDINGS: Hypodensity along the left inferior caudate (series 2/  image 14 ; coronal image 30), new from the prior, worrisome for acute/subacute infarct. Adjacent chronic left basal ganglia lacunar infarcts. No evidence of parenchymal hemorrhage or extra-axial fluid collection. No mass lesion, mass effect, or midline shift. Subcortical white matter and periventricular small vessel ischemic changes. Mild global cortical atrophy.  No ventriculomegaly. Partial opacification of the bilateral maxillary and left sphenoid sinuses. Mastoid air cells are clear. No evidence of calvarial fracture. IMPRESSION: Acute versus subacute infarct involving the left inferior caudate, new from 06/29/2016. No evidence of traumatic injury. These results will be called to the ordering clinician or representative by the Radiologist Assistant, and communication documented in the PACS or zVision Dashboard. Electronically Signed   By: Julian Hy M.D.   On: 07/06/2016 20:25   Ct Head Wo Contrast  Result Date: 06/29/2016 CLINICAL DATA:  Altered mental status EXAM: CT HEAD WITHOUT CONTRAST TECHNIQUE: Contiguous axial images were obtained from the base of the skull through the vertex without intravenous contrast. COMPARISON:  MRI brain dated 06/21/2016 FINDINGS: No evidence of parenchymal hemorrhage or extra-axial fluid collection. No mass lesion, mass effect, or midline shift. No CT evidence of acute infarction. Lacunar infarcts in the left corona radiata and basal ganglia. Subcortical white matter and periventricular small vessel ischemic changes. Cerebral volume is within normal limits.  No ventriculomegaly. New complete opacification of the bilateral maxillary sinuses. Partial opacification of the bilateral ethmoid and sphenoid sinuses. Partial opacification of the right mastoid air cells. No evidence of calvarial fracture. IMPRESSION: No evidence of acute intracranial abnormality. Lacunar infarcts in the left basal ganglia and corona radiata. Electronically Signed   By: Julian Hy M.D.    On: 06/29/2016 16:31  Ct Head Wo Contrast  Result Date: 06/17/2016 CLINICAL DATA:  Unresponsive EXAM: CT HEAD WITHOUT CONTRAST TECHNIQUE: Contiguous axial images were obtained from the base of the skull through the vertex without intravenous contrast. COMPARISON:  05/19/2016 FINDINGS: The bony calvarium is intact. Increased mucosal thickening is noted within the ethmoid and sphenoid sinus. Air-fluid levels are noted within the maxillary antra bilaterally consistent with a more acute degree of sinusitis. Lacunar infarct is noted within the left basal ganglia and left thalamus extending into the deep white matter new from the prior exam. No findings to suggest acute hemorrhage are noted. IMPRESSION: New areas of subacute to chronic ischemia on the left. Progression in the degree of sinusitis as described. Electronically Signed   By: Inez Catalina M.D.   On: 06/17/2016 13:06   Mr Jeri Cos HQ Contrast  Result Date: 06/21/2016 CLINICAL DATA:  Initial evaluation for recurrent altered mental status, seizures, history of strep pneumo meningitis. Question new areas of and within the left basal ganglia. EXAM: MRI HEAD WITHOUT AND WITH CONTRAST TECHNIQUE: Multiplanar, multiecho pulse sequences of  the brain and surrounding structures were obtained without and with intravenous contrast. CONTRAST:  59m MULTIHANCE GADOBENATE DIMEGLUMINE 529 MG/ML IV SOLN COMPARISON:  Comparison made with prior CT from 06/17/2016 as well as previous MRI from 03/24/2016. FINDINGS: Diffuse prominence of the CSF containing spaces compatible with generalized cerebral atrophy. There has been normal interval expected evolution of previously identified acute ischemic infarct involving the left corona radiata/basal ganglia, which now demonstrates progressive encephalomalacia without significant diffusion abnormality. Question minimal faint enhancement within this region extending towards the left cerebral peduncle, likely related to the late  subacute ischemia (series 11, image 28). Additional remote infarctions involving the bilateral basal ganglia again seen. Chronic blood products again noted within the left external capsule. No new areas of restricted diffusion to suggest new ischemia or injury. Similar FLAIR signal abnormality at the right parietal cortex. Associated subtle laminar necrosis within this region (series 3, image 6). No significant enhancement now seen within this region. Additional focus of cortical flares abnormality within the right frontal lobe slightly more prominent as compared to previous exam, although this appears to be chronic in nature. No appreciable enhancement within these areas. No other abnormal enhancement elsewhere within the brain. Major intracranial vascular flow voids are preserved. No mass lesion, mass effect, or midline shift. Layering susceptibility artifact within the stent was of both lateral ventricles, likely debris/chronic hemorrhage related to prior meningitis, similar to prior. No hydrocephalus. Craniocervical junction within normal limits. Visualized upper cervical spine unremarkable. Pituitary gland normal. No acute abnormality about the globes and orbits. Extensive opacity throughout the paranasal sinuses with fluid levels within the sphenoid and maxillary sinuses. Bilateral mastoid effusions. Fluid within the nasopharynx. Patient likely intubated. Bone marrow signal intensity within normal limits. No scalp soft tissue abnormality. IMPRESSION: 1. Normal expected interval evolution of previously identified infarct involving the left basal ganglia/corona radiata. Additionally, previously seen signal abnormalities within the bilateral basal ganglia have also largely resolved relative to previous MRI from 03/24/2016. Similarly, changes at the right parietal cortex have also evolved. No new areas of ischemia or injury identified. 2. Persistent layering debris/hemorrhage within the lateral ventricles, likely  related to recent history of meningitis. No hydrocephalus. Electronically Signed   By: BJeannine BogaM.D.   On: 06/21/2016 04:55   Dg Chest Port 1 View  Result Date: 07/05/2016 CLINICAL DATA:  Altered mental status EXAM: PORTABLE CHEST 1 VIEW COMPARISON:  06/30/2016 FINDINGS: Stable hazy appearance of the bases which is likely combination of atelectasis and pleural fluid. No edema, air bronchograms, or pneumothorax. Normal heart size and mediastinal contours. IMPRESSION: Stable appearance of the chest. Hazy basilar opacities again favor small effusions and atelectasis. Electronically Signed   By: JMonte FantasiaM.D.   On: 07/05/2016 09:19   Dg Chest Port 1 View  Result Date: 06/30/2016 CLINICAL DATA:  Altered mental status.  Low body temperature. EXAM: PORTABLE CHEST 1 VIEW COMPARISON:  06/25/2016 FINDINGS: Lungs are adequately inflated as patient is slightly rotated to the right. Interval resolution of left base opacification with improvement but mild residual hazy opacification in the right base possibly due to small layering effusions/atelectasis, although cannot exclude infection cardiomediastinal silhouette and remainder of the exam is unchanged. IMPRESSION: Interval improvement with mild residual hazy opacification in the right base likely small layering effusions/atelectasis although cannot exclude infection. Electronically Signed   By: DMarin OlpM.D.   On: 06/30/2016 08:56  Dg Chest Port 1 View  Result Date: 06/25/2016 CLINICAL DATA:  Pneumonia. EXAM: PORTABLE CHEST 1 VIEW  COMPARISON:  06/22/2016. FINDINGS: Interval extubation. Left IJ central line tip projects over the SVC. Heart size normal. Left lower lobe collapse/ consolidation. Hazy opacification over the lower hemithoraces bilaterally, new on the right. IMPRESSION: 1. Left lower lobe collapse/consolidation. 2. Hazy opacification over both lower hemithoraces may be due to layering pleural fluid and/or true airspace opacification.  Electronically Signed   By: Lorin Picket M.D.   On: 06/25/2016 10:13   Dg Chest Port 1 View  Result Date: 06/22/2016 CLINICAL DATA:  Respiratory failure, acute encephalopathy, cardiogenic shock, acute and chronic CHF, current smoker. EXAM: PORTABLE CHEST 1 VIEW COMPARISON:  Portable chest x-ray of June 19, 2016 FINDINGS: The right lung is adequately inflated and clear. On the left there is persistent basilar atelectasis or pneumonia with small pleural effusion. The heart is normal in size. The pulmonary vascularity is not engorged. The endotracheal tube tip lies 5.3 cm above the carina. The esophagogastric tube tip projects below the inferior margin of the image. The left internal jugular venous catheter tip projects over the proximal third of the SVC. IMPRESSION: Persistent left lower lobe atelectasis or pneumonia with trace pleural effusion. No CHF. The support tubes are in stable position. Electronically Signed   By: David  Martinique M.D.   On: 06/22/2016 07:20   Dg Chest Portable 1 View  Result Date: 06/19/2016 CLINICAL DATA:  Respiratory failure EXAM: PORTABLE CHEST 1 VIEW COMPARISON:  06/18/2016 FINDINGS: ET tube tip is above the carina. There is a left IJ catheter with tip in the projection of the SVC. Enteric tube tip is below the hemidiaphragm. Persistent opacity within the left lung base. Right lung is clear. IMPRESSION: 1. Stable position of the support apparatus. 2. No change in left base opacity. Electronically Signed   By: Kerby Moors M.D.   On: 06/19/2016 07:44   Dg Chest Port 1 View  Result Date: 06/18/2016 CLINICAL DATA:  50 year old male with a history of respiratory failure EXAM: PORTABLE CHEST 1 VIEW COMPARISON:  06/17/2016, 06/16/2016 FINDINGS: Cardiomediastinal silhouette unchanged in size and contour. Endotracheal tube unchanged, terminating 3.8 cm above the carina. Unchanged left IJ approach central venous catheter, appearing to terminate superior vena cava. Gastric tube  projects over the mediastinum, terminating out of the field of view. No visualized pneumothorax. Improved aeration in the left mid lung, with persistent opacity in the retrocardiac region. Blunting of left costophrenic angle. IMPRESSION: Improved aeration of the left lung, with persistent left basilar opacity, potentially consolidation, edema, and/ or atelectasis. Small left pleural effusion not excluded. Unchanged position of endotracheal tube, gastric tube, left IJ central venous catheter. Signed, Dulcy Fanny. Earleen Newport, DO [facility] Electronically Signed   By: Corrie Mckusick D.O.   On: 06/18/2016 10:15   Dg Chest Port 1 View  Result Date: 06/17/2016 CLINICAL DATA:  50 year old male with enteric tube placement. EXAM: PORTABLE CHEST 1 VIEW COMPARISON:  A abdominal radiograph dated 06/16/2016 FINDINGS: An endotracheal tube is noted with tip approximately 3 cm above the carina. Single portable view of the chest demonstrates patchy area of hazy airspace opacity in the left mid to lower lung field. There is no pleural effusion or pneumothorax. The cardiac silhouette is within normal limits. Left IJ central line with tip over central SVC. An enteric tube extends into the epigastric area with tip superimposed over the right L3 pedicle likely in the distal stomach. There is no bowel dilatation or evidence of obstruction. No free air. No radiopaque calculi or foreign  object. The soft tissues and osseous structures appear unremarkable. The previously seen left femoral central venous catheter has been removed. A temperature probe is partially visualized over the bladder. IMPRESSION: Endotracheal tube above the carina. Patchy area of airspace density in the left mid to lower lung field. Follow-up recommended. Enteric tube in the distal stomach.  No bowel obstruction. Electronically Signed   By: Anner Crete M.D.   On: 06/17/2016 02:48   Dg Chest Port 1  View  Result Date: 06/16/2016 CLINICAL DATA:  Endotracheal tube placement EXAM: PORTABLE CHEST 1 VIEW COMPARISON:  06/16/2016 FINDINGS: There is an endotracheal tube with the tip 3.7 cm above the carina. Nasogastric tube coursing below the diaphragm. There is hazy left lower lobe airspace disease. There is no pleural effusion or pneumothorax. The heart and mediastinal contours are unremarkable. The osseous structures are unremarkable. IMPRESSION: 1. Hazy left lower lobe airspace disease concerning for pneumonia. 2. Endotracheal tube with the tip 3.7 cm above the carina. Electronically Signed   By: Kathreen Devoid   On: 06/16/2016 22:41   Dg Chest Port 1 View  Result Date: 06/16/2016 CLINICAL DATA:  Intubation.  Followup pneumonia. EXAM: PORTABLE CHEST 1 VIEW 8:24 p.m.: COMPARISON:  Portable chest x-ray earlier same date 5:49 p.m. and previously. FINDINGS: Endotracheal tube tip in satisfactory position projecting approximately 6 cm above the carina. Nasogastric tube courses below the diaphragm into the stomach though its tip is not included on the image. Consolidation in the left lower lobe as noted earlier. Developing patchy opacities at the right lung base. IMPRESSION: 1. Support apparatus satisfactory. Endotracheal tube tip approximately 6 cm above the carina. Nasogastric tube courses below the diaphragm into the stomach. 2. Left lower lobe pneumonia as noted earlier. Developing atelectasis and/or pneumonia involving the right lung base. Electronically Signed   By: Evangeline Dakin M.D.   On: 06/16/2016 20:49   Dg Chest Port 1 View  Result Date: 06/16/2016 CLINICAL DATA:  50 year old with possible aspiration and sepsis. EXAM: PORTABLE CHEST 1 VIEW COMPARISON:  05/29/2016, 05/27/2016 and earlier. FINDINGS: Patient is rotated to the left. Cardiomediastinal silhouette unremarkable, unchanged. Airspace consolidation in the retrocardiac left lung base. Lungs otherwise clear. Pulmonary vascularity normal. No  visible pleural effusions. IMPRESSION: Left lower lobe pneumonia. Electronically Signed   By: Evangeline Dakin M.D.   On: 06/16/2016 18:22   Dg Abd Portable 1v  Result Date: 06/17/2016 CLINICAL DATA:  50 year old male with enteric tube placement. EXAM: PORTABLE CHEST 1 VIEW COMPARISON:  A abdominal radiograph dated 06/16/2016 FINDINGS: An endotracheal tube is noted with tip approximately 3 cm above the carina. Single portable view of the chest demonstrates patchy area of hazy airspace opacity in the left mid to lower lung field. There is no pleural effusion or pneumothorax. The cardiac silhouette is within normal limits. Left IJ central line with tip over central SVC. An enteric tube extends into the epigastric area with tip superimposed over the right L3 pedicle likely in the distal stomach. There is no bowel dilatation or evidence of obstruction. No free air. No radiopaque calculi or foreign object. The soft tissues and osseous structures appear unremarkable. The previously seen left femoral central venous catheter has been removed. A temperature probe is partially visualized over the bladder. IMPRESSION: Endotracheal tube above the carina. Patchy area of airspace density in the left mid to lower lung field. Follow-up recommended. Enteric tube in the distal stomach.  No bowel obstruction. Electronically Signed   By: Laren Everts.D.  On: 06/17/2016 02:48   Dg Swallowing Func-speech Pathology  Result Date: 06/29/2016 Objective Swallowing Evaluation: Type of Study: MBS-Modified Barium Swallow Study Patient Details Name: COLE EASTRIDGE MRN: [id number] Date of Birth: 06/22/1966 Today's Date: 06/29/2016 Time: SLP Start Time (ACUTE ONLY): 1215-SLP Stop Time (ACUTE ONLY): 1235 SLP Time Calculation (min) (ACUTE ONLY): 20 min Past Medical History: Past Medical History: Diagnosis Date . Acute encephalopathy  . Acute ischemic stroke (HCC)  . Acute renal failure (HCC)  . Acute respiratory failure with hypoxia (HCC)   . AKI (acute kidney injury) (HCC) 02/16/2016 . Altered mental status  . Cardiac arrest (HCC)  . Cardiogenic shock (HCC)  . Cerebral septic emboli (HCC)  . Cerebral thrombosis with cerebral infarction 03/25/2016 . CHF (congestive heart failure) (HCC)  . Convulsions/seizures (HCC) 05/04/2016  Seizure disorder - Continue Keppra, Depakote.    . Dementia 04/27/2016 . Depression  . Dysphagia 05/04/2016  Dysphagia - Dysphagia 1 diet per SLP    . ETOH abuse  . Hydrocephalus  . Hyperlipidemia 05/04/2016  Dyslipidemia - Continue statin.    Marland Kitchen Hypernatremia  . Hypothermia 03/20/2016 . Low serum cortisol level (HCC) 05/04/2016  Low a.m. cortisol  - Was 1.2 checked on 04/03/2016  - This likely is reflective of cortisol suppression from steroid use - Tapered steroids off  . Meningitis, pneumococcal, recurrent  . Meningitis, streptococcal 02/16/2016 . Meningoencephalitis  . NSTEMI (non-ST elevated myocardial infarction) (HCC) 03/14/2016 . Protein-calorie malnutrition, severe (HCC)  . Respiratory failure (HCC) 03/03/2016 . Septic shock (HCC)  . Severe sepsis (HCC)  . 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 Substance abuse  . Systolic and diastolic CHF, chronic (HCC)  . Tobacco abuse 02/16/2016 . Trichomonal urethritis in male 02/16/2016 . Urinary retention 05/04/2016  Urinary retention - Continue Flomax    Past Surgical History: Past Surgical History: Procedure Laterality Date . ANKLE SURGERY   . CARDIAC CATHETERIZATION N/A 02/17/2016  Procedure: IABP Insertion;  Surgeon: Iran Ouch, MD;  Location: [facility] INVASIVE CV LAB;  Service: Cardiovascular;  Laterality: N/A; . 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  Re-admitted 7/13 again presenting with AMS thought to be septic/PNA in origin but had witnessed seizure in ED.  SLP evaluations during previous admissions have recommended Dys 1 diet and nectar thick liquids with advancement to Dys 2 textures prior to discharge.  Intubated 7/13-21.  Subjective: Pt kept eyes closed during MBS. Appeared lethargic, but accepted po presentations Assessment / Plan / Recommendation CHL IP CLINICAL IMPRESSIONS 06/29/2016 Therapy Diagnosis Mild oral phase dysphagia;Moderate pharyngeal phase dysphagia Clinical Impression Pt presents with mild oral, moderate pharyngeal phase dysphagia, characterized by poor bolus formation and posterior propulsion, resulting in posterior spillage to the vallecula on nectar, honey, and puree consistencies, and at the pyriform sinus on thin liquids. Silent aspiration was noted during the swallow of thin liquids. No penetration  or aspiration was observed on nectar, honey, or puree consistencies, however, risk of episodic aspiration of all consistencies is possible, given poor oral prep and delayed swallow reflex. Solid consistencies were not provided at this time, due to extended ineffective oral bolus formation and propulsion and associated risk of aspiration with fatigue.  Recommend Puree  diet and nectar thick liquids with full supervision. Pt needs to be monitored closely during meals to insure oral cavity is clear before provided the next bolus. Safe swallow precautions were sent back to room with pt. Impact on safety and function Moderate aspiration risk   CHL IP TREATMENT RECOMMENDATION 06/29/2016 Treatment Recommendations Therapy as outlined in treatment plan below   Prognosis 06/29/2016 Prognosis for Safe Diet Advancement Fair Barriers to Reach Goals Cognitive deficits CHL IP DIET RECOMMENDATION 06/29/2016 SLP Diet Recommendations Dysphagia 1 (Puree) solids;Nectar thick liquid  Liquid Administration via Straw;Cup Medication Administration Crushed with puree Compensations Slow rate;Small sips/bites;Minimize environmental distractions Postural Changes Remain semi-upright after after feeds/meals (Comment);Seated upright at 90 degrees   CHL IP OTHER RECOMMENDATIONS 06/29/2016 Recommended Consults -- Oral Care Recommendations Oral care BID Other Recommendations Order thickener from pharmacy;Prohibited food (jello, ice cream, thin soups);Remove water pitcher   CHL IP FOLLOW UP RECOMMENDATIONS 06/29/2016 Follow up Recommendations Skilled Nursing facility   [facility] IP FREQUENCY AND DURATION 06/29/2016 Speech Therapy Frequency (ACUTE ONLY) min 2x/week Treatment Duration 2 weeks    CHL IP ORAL PHASE 06/29/2016 Oral Phase Impaired Oral - Honey Teaspoon Premature spillage;Reduced posterior propulsion Oral - Nectar Teaspoon Premature spillage;Reduced posterior propulsion;Delayed oral transit Oral - Nectar Straw Premature spillage;Reduced posterior propulsion;Delayed oral transit Oral - Thin Straw Premature spillage;Reduced posterior propulsion;Delayed oral transit Oral - Puree Premature spillage;Delayed oral transit;Reduced posterior propulsion  CHL IP CERVICAL ESOPHAGEAL PHASE 06/29/2016 Cervical Esophageal Phase [facility] Shonna Chock 06/29/2016, 1:27 PM Celia B. Broxton, [facility], CCC-SLP [phone]               Time Spent in minutes  39   Armya Westerhoff M.D on 07/09/2016 at 8:50 AM  Between 7am to 7pm - Pager - [phone]  After 7pm go to [email or web address] - password [facility]  [facility] -  Office  [phone]

## 2016-07-09 NOTE — Progress Notes (Signed)
Hypoglycemic Event  CBG: 57  Treatment: amp D50  Symptoms: none  Follow-up CBG: [id number] CBG Result:132  Possible Reasons for Event: unknown  Comments/MD notified:No    Manson Passey, Boykin Peek

## 2016-07-09 NOTE — Progress Notes (Signed)
[facility] paged to inform of rectal temp 94.3.Ilean Skill LPN

## 2016-07-10 LAB — GLUCOSE, CAPILLARY
GLUCOSE-CAPILLARY: 79 mg/dL (ref 65–99)
GLUCOSE-CAPILLARY: 75 mg/dL (ref 65–99)
GLUCOSE-CAPILLARY: 81 mg/dL (ref 65–99)
Glucose-Capillary: 75 mg/dL (ref 65–99)
Glucose-Capillary: 99 mg/dL (ref 65–99)

## 2016-07-10 LAB — BASIC METABOLIC PANEL
ANION GAP: 8 (ref 5–15)
BUN: 13 mg/dL (ref 6–20)
CALCIUM: 8.8 mg/dL — AB (ref 8.9–10.3)
CO2: 36 mmol/L — ABNORMAL HIGH (ref 22–32)
Chloride: 104 mmol/L (ref 101–111)
Creatinine, Ser: 0.84 mg/dL (ref 0.61–1.24)
GFR calc Af Amer: >60 mL/min (ref >60)
GLUCOSE: 95 mg/dL (ref 65–99)
POTASSIUM: 4.5 mmol/L (ref 3.5–5.1)
SODIUM: 148 mmol/L — AB (ref 135–145)

## 2016-07-10 MED ORDER — POTASSIUM CHLORIDE CRYS ER 20 MEQ PO TBCR
40.0000 meq | EXTENDED_RELEASE_TABLET | Freq: Every day | ORAL | Status: DC
  Administered 2016-07-10 – 2016-07-11 (×2): 40 meq via ORAL
  Filled 2016-07-10 (×3): qty 2

## 2016-07-10 NOTE — Progress Notes (Signed)
Patient ID: Dale [name], male   DOB: 20-Apr-1966, 50 y.o.   MRN: [id number]                                                                PROGRESS NOTE                                                                                                                                                                                                             Patient Demographics:    Dale [name], is a 51 y.o. male, DOB - March 25, 1966, LPF:[id number]  Admit date - 06/16/2016   Admitting Physician Brand Males, MD  Outpatient Primary MD for the patient is No primary care provider on file.  LOS - 24  Outpatient Specialists:  Chief Complaint  Patient presents with  . Aspiration  . Possible Sepsis        Brief Narrative  50 year old male with complex medical history including:  3/14-4/6: Admitted with strep pneumo meningitis, c/b cardiac arrest, and cardiogenic shock (EF 20%, recovered to 50%) briefly on amiodarone, ARDS requiring prolonged intubation and trach (now removed), ARF, shock liver, and adrenal insufficiency. He was discharged on 4/6 with weekly Pen G IM until 4/10. Has been re-admitted several times w/ recurrent infection and seen by ID. Re-admitted 7/13 again presenting with AMS thought to be septic/PNA in origin but had witnessed seizure in ED. He was found to be hypotensive and hypothermic, however WBC and Lactic were wnl. CXR with LLL opacification He required CVL placement and vasopressors for shock despite aggressive IVF resuscitation. ICU admission for shock treatment and continuous EEG. He was intubated in the ED for airway protection and transferred to [facility]. Extubated 7/21. Transferred to [facility] on 7/25.Patient develops signs of adrenal insufficiency again on 7/26 and had to be moved back to stepdown   Subjective:    Dale [name]  unchanged, left lateral gaze which has been there for several days , but more awake today,Sitter at bedside . Continues to have hypothermia  hypoglycemic episodes   Assessment  & Plan :    Active Problems:   Acute encephalopathy   Acute respiratory failure (HCC)   Acute respiratory failure with hypercapnia ([place])   Encounter for central line placement   Pressure ulcer   Hypoglycemia  Bradycardia   Adrenal insufficiency (Addison's disease) ([place])   Hypoglycemia due to adrenal insufficiency-last episode 8/5 in the morning Unable to tolerate weaning of steroids,now on high-dose oral steroids per endocrine ,Continue 100 mg TID,PO Hold D10 infusion and monitor CBGs, continues to have hypoglycemic episodes Feeding every 4 hours orders in place  Monitor CBGs on increased dose of steroids and frequent feeding and see what happens Suspect hypothalamic dysregulation, ACTH deficiency, endocrinology does not think that hypothermia and hypoglycemia is connected with adrenal insufficiency,  ACTH levels pending  Bradycardia >currently sinus in 40's-80's H/O Cardiac Arrest w/ NSTEMI H/O Systolic CHF - EF 45 - 62% per echo from June 2017 H/O Hyperlipidemia Continue aspirin and Lipitor Cardiology was consulted for bradycardia, sinus bradycardia with narrow QRS complex, no need for pacemaker, avoid AV nodal blocking drugs   Acute Encephalopathy - ongoing. Following commands. Waxing and waning mental status, repeat CT scan on 7/26 did not show any evidence of acute intracranial abnormality. Lacunar infarcts in the left basal ganglia present on CT scan on 7/14 Patient has been evaluated extensively by neurology during this admission, without any improvement in his mental status, no trauma on CT from 8/2 from the fall yesterday   Adrenal insufficiency-manifesting as hypothermia hypoglycemia hypotension-consulted Dr. Dwyane Dee from endocrinology Has not been able to tolerate oral hydrocortisone thus far frequently switching back to IV hydrocortisone, however despite very high dose oral steroids and Florinef, patient has not improved,  Cortisol level was 4.3 on admission, was 1.2 on 4/17  Seizures. -strep pneumo meningitis earlier this year, Continue with Keppra and Depakote, dose adjusted during this hospitalization, continue prn Ativan for seizures,Dale [name] appears to be waxing and waning.He has no evidence of any ongoing seizure . Depakote level therapeutic    New Subacute Ischemia - extensive workup by neurology as follows H/O strep pneumo meningitis, hydrocephalus, new CVA.Seizure disorder Neurology suspectsmild left upper extremity weakness related to the left basal ganglia infarction.  CT head 7/14:New areas of subacute to chronic ischemia on the left. Progression in the degree of sinusitis as described. EEG 7/14:This was an abnormal EEG due to the presence of moderate background slowing. This slowing is non-specific and is indicative of encephalopathy. MRI 7/18:1. Normal expected interval evolution of previously identified infarct involving the left basal ganglia/corona radiata. Additionally, previously seen signal abnormalities within the bilateral basal ganglia have also largely resolved relative to previous MRI from 03/24/2016. Similarly, changes at the right parietal cortex have also evolved. No new areas of ischemia orinjury identified.  Persistent layering debris/hemorrhage within the lateral ventricles, likely related to recent history of meningitis. No Hydrocephalus. Continuous EEG 7/20: Mild diffuse encephalopathy. No electrographic seizures. Patient remains at high-risk for aspiration with waxing and waning mental status   Sepsis - Possible HCAP-->completed abx course  h/O strep pneumo meningitis (march 2017). Received repeat doses empiric vanco and zosyn from 7/13 to 7/22 due to hypothermia, cx's performed No evidence of any underlying infection at this time   Acute Hypoxic Respiratory Failure Unable to Protect Airway in the setting of seizures.Intubated from 7/13-7/21. HCAP , chest  x-ray on 7/22 showed left lower lobe collapse, chest x-ray on 7/27 showed interval improvement with residual hazy opacification in the right base H/O Tracheostomy March 2017 - S/P Decannulation. Mobilize-will need rehabilitation However has been stable from a pulmonary standpoint  Shock - Resolved. Possibly secondary to sepsis.   Hypokalemia - resolved  Hypernatremia/Hyperchloremia - Mild. Continue to encourage by mouth intake  Urinary retention - Foley replaced.,  Trending UOP with Foley. Bethanechol started 7/18.Continue Flomax  Dysphagia  Dysphagia 1 (puree) nectar thick liquid based on speech therapy evaluation   Anemia of chronic disease No signs of active bleeding.  Stable,  Check cbc in am    Code Status : FULL CODE  Family Communication  : Discussed with patient's wife and entire extended family, patient has not improved neurologically over the last several days of this admission, do not anticipate any meaningful recovery,no stabilization of vital signs ,  wife feels that the patient' cannot be left to die, making him a DNR would be like killing him, if his vital signs are abnormal, she would like to bring him back,. Suggested that patient is not a candidate for intubation in the future, recommended DNR/DNI, at this point they have declined palliative care recommendations as well as recommendations multiple physicians consulting on the patient. He remains at high risk of aspiration, reintubation, readmission,his vitals change every day , he is unstable on daily basis, high risk for readmission. Therefore I have requested case manager to discuss with  administration about how to handle this situation    Disposition Plan  : SNF [facility], anticipate discharge tomorrow if no recurrent hypoglycemia overnight  Barriers For Discharge : Abnormal vital signs,[facility] may be more appropriate for this patient's care long-term  Consults  :  Cardiology,  neurology,endocrinology,  Procedures  :   DVT Prophylaxis  :  Lovenox -  SCDs  Lab Results  Component Value Date   PLT 201 07/09/2016    Antibiotics  :  Vanc/zosyn 7/13-7/22     Objective:   Vitals:   07/08/16 1957 07/09/16 0700 07/09/16 1236 07/09/16 1930  BP: (!) 148/79 (!) 154/76 114/69 122/68  Pulse: (!) 52 (!) 46 (!) 43 (!) 50  Resp: _0 Temp: 97 F (36.1 C) (!) 94.3 F (34.6 C) (!) 93.4 F (34.1 C) 97.1 F (36.2 C)  TempSrc: Axillary Rectal Axillary Axillary  SpO2: 100% 100% 96% 95%  Weight:  64.9 kg (143 lb)    Height:        Wt Readings from Last 3 Encounters:  07/09/16 64.9 kg (143 lb)  05/30/16 55.4 kg (122 lb 2.2 oz)  05/09/16 55.8 kg (123 lb)     Intake/Output Summary (Last 24 hours) at 07/10/16 0849 Last data filed at 07/09/16 1746  Gross per 24 hour  Intake              360 ml  Output                0 ml  Net              360 ml     Physical Exam  Awake Alert, Oriented X ? No new F.N deficits, Normal affect .AT,PERRAL Supple Neck,No JVD, No cervical lymphadenopathy appriciated.  Symmetrical Chest wall movement, Good air movement bilaterally, CTAB RRR,No Gallops,Rubs or new Murmurs, No Parasternal Heave +ve B.Sounds, Abd Soft, No tenderness, No organomegaly appriciated, No rebound - guarding or rigidity. No Cyanosis, Clubbing or edema, No new Rash or bruise  Pt able to move all 4 ext, pt able to sit up in bed    Data Review:    CBC  Recent Labs Lab 07/04/16 0212 07/06/16 0322 07/07/16 0326 07/09/16 0451  WBC 10.1 8.2 5.9 8.6  HGB 8.1* 9.3* 9.1* 8.6*  HCT 25.9* 29.4* 28.9* 28.0*  PLT 283 247 214 201  MCV 89.9 90.2 88.9 91.2  MCH 28.1 28.5 28.0 28.0  MCHC 31.3 31.6 31.5 30.7  RDW 17.6* 17.9* 17.4* 17.8*    Chemistries   Recent Labs Lab 07/04/16 0212 07/06/16 0322 07/07/16 0326 07/09/16 0451 07/09/16 1618 07/10/16 0409  NA 147* 146* 144 148*  --  148*  K 3.6 3.8 3.1* 3.4*  --  4.5  CL 114* 107 105 104   --  104  CO2 28 29 32 36*  --  36*  GLUCOSE 91 79 105* 74  --  95  BUN _0 --  13  CREATININE 0.79 0.76 0.76 0.76  --  0.84  CALCIUM 8.1* 8.3* 8.3* 8.6*  --  8.8*  MG  --   --   --   --  2.0  --   AST 12* 13* 13* 14*  --   --   ALT 12* 12* 14* 11*  --   --   ALKPHOS 44 48 50 48  --   --   BILITOT 0.3 0.2* 0.1* 0.4  --   --    ------------------------------------------------------------------------------------------------------------------ No results for input(s): CHOL, HDL, LDLCALC, TRIG, CHOLHDL, LDLDIRECT in the last 72 hours.  Lab Results  Component Value Date   HGBA1C 5.3 05/20/2016   ------------------------------------------------------------------------------------------------------------------ No results for input(s): TSH, T4TOTAL, T3FREE, THYROIDAB in the last 72 hours.  Invalid input(s): FREET3 ------------------------------------------------------------------------------------------------------------------ No results for input(s): VITAMINB12, FOLATE, FERRITIN, TIBC, IRON, RETICCTPCT in the last 72 hours.  Coagulation profile No results for input(s): INR, PROTIME in the last 168 hours.  No results for input(s): DDIMER in the last 72 hours.  Cardiac Enzymes No results for input(s): CKMB, TROPONINI, MYOGLOBIN in the last 168 hours.  Invalid input(s): CK ------------------------------------------------------------------------------------------------------------------    Component Value Date/Time   BNP 23.8 03/20/2016 1800    Inpatient Medications  Scheduled Meds: . aspirin  325 mg Per Tube Daily  . atorvastatin  20 mg Oral q1800  . bethanechol  10 mg Oral Q8H  . chlorhexidine gluconate (SAGE KIT)  15 mL Mouth Rinse BID  . divalproex  750 mg Oral Q8H  . enoxaparin (LOVENOX) injection  40 mg Subcutaneous Q24H  . fludrocortisone  0.1 mg Oral Daily  . folic acid  1 mg Oral Daily  . hydrocortisone  100 mg Oral TID WC  . insulin aspart  0-15 Units  Subcutaneous TID WC  . insulin aspart  0-5 Units Subcutaneous QHS  . levETIRAcetam  1,500 mg Oral BID  . potassium chloride  40 mEq Oral BID  . thiamine  100 mg Oral Daily   Continuous Infusions:   PRN Meds:.LORazepam, RESOURCE THICKENUP CLEAR, sodium chloride, sodium chloride flush  Micro Results Recent Results (from the past 240 hour(s))  Culture, blood (routine x 2)     Status: None   Collection Time: 06/30/16 10:00 AM  Result Value Ref Range Status   Specimen Description BLOOD RIGHT ARM  Final   Special Requests BOTTLES DRAWN AEROBIC AND ANAEROBIC 10CC  Final   Culture NO GROWTH 5 DAYS  Final   Report Status 07/05/2016 FINAL  Final  Culture, blood (routine x 2)     Status: None   Collection Time: 06/30/16 10:10 AM  Result Value Ref Range Status   Specimen Description BLOOD RIGHT HAND  Final   Special Requests   Final    BOTTLES DRAWN AEROBIC AND ANAEROBIC 10CC AER [place] ANA   Culture NO GROWTH 5 DAYS  Final   Report Status 07/05/2016 FINAL  Final  Radiology Reports Dg Pelvis 1-2 Views  Result Date: 07/06/2016 CLINICAL DATA:  Fall EXAM: PELVIS - 1-2 VIEW COMPARISON:  None. FINDINGS: No fracture or dislocation is seen. Follow hip joint spaces are preserved. Visualized bony pelvis appears intact. IMPRESSION: No fracture or dislocation is seen. Electronically Signed   By: Julian Hy M.D.   On: 07/06/2016 20:32   Dg Abd 1 View  Result Date: 06/16/2016 CLINICAL DATA:  Orogastric tube placement. EXAM: ABDOMEN - 1 VIEW COMPARISON:  05/25/2016 FINDINGS: An orogastric tube is now seen with tip overlying the distal stomach in the expected region of the pylorus. A left femoral central venous catheter is seen in place as well as a temperature probe within the bladder. The bowel gas pattern is normal. IMPRESSION: Orogastric tube tip overlies the distal stomach, in expected region of pylorus. Electronically Signed   By: Earle Gell M.D.   On: 06/16/2016 20:50   Ct Head Wo  Contrast  Result Date: 07/06/2016 CLINICAL DATA:  Fall from bed EXAM: CT HEAD WITHOUT CONTRAST TECHNIQUE: Contiguous axial images were obtained from the base of the skull through the vertex without intravenous contrast. COMPARISON:  06/29/2016 FINDINGS: Hypodensity along the left inferior caudate (series 2/ image 14 ; coronal image 30), new from the prior, worrisome for acute/subacute infarct. Adjacent chronic left basal ganglia lacunar infarcts. No evidence of parenchymal hemorrhage or extra-axial fluid collection. No mass lesion, mass effect, or midline shift. Subcortical white matter and periventricular small vessel ischemic changes. Mild global cortical atrophy.  No ventriculomegaly. Partial opacification of the bilateral maxillary and left sphenoid sinuses. Mastoid air cells are clear. No evidence of calvarial fracture. IMPRESSION: Acute versus subacute infarct involving the left inferior caudate, new from 06/29/2016. No evidence of traumatic injury. These results will be called to the ordering clinician or representative by the Radiologist Assistant, and communication documented in the PACS or zVision Dashboard. Electronically Signed   By: Julian Hy M.D.   On: 07/06/2016 20:25   Ct Head Wo Contrast  Result Date: 06/29/2016 CLINICAL DATA:  Altered mental status EXAM: CT HEAD WITHOUT CONTRAST TECHNIQUE: Contiguous axial images were obtained from the base of the skull through the vertex without intravenous contrast. COMPARISON:  MRI brain dated 06/21/2016 FINDINGS: No evidence of parenchymal hemorrhage or extra-axial fluid collection. No mass lesion, mass effect, or midline shift. No CT evidence of acute infarction. Lacunar infarcts in the left corona radiata and basal ganglia. Subcortical white matter and periventricular small vessel ischemic changes. Cerebral volume is within normal limits.  No ventriculomegaly. New complete opacification of the bilateral maxillary sinuses. Partial opacification of  the bilateral ethmoid and sphenoid sinuses. Partial opacification of the right mastoid air cells. No evidence of calvarial fracture. IMPRESSION: No evidence of acute intracranial abnormality. Lacunar infarcts in the left basal ganglia and corona radiata. Electronically Signed   By: Julian Hy M.D.   On: 06/29/2016 16:31  Ct Head Wo Contrast  Result Date: 06/17/2016 CLINICAL DATA:  Unresponsive EXAM: CT HEAD WITHOUT CONTRAST TECHNIQUE: Contiguous axial images were obtained from the base of the skull through the vertex without intravenous contrast. COMPARISON:  05/19/2016 FINDINGS: The bony calvarium is intact. Increased mucosal thickening is noted within the ethmoid and sphenoid sinus. Air-fluid levels are noted within the maxillary antra bilaterally consistent with a more acute degree of sinusitis. Lacunar infarct is noted within the left basal ganglia and left thalamus extending into the deep white matter new from the prior exam. No findings to suggest acute hemorrhage  are noted. IMPRESSION: New areas of subacute to chronic ischemia on the left. Progression in the degree of sinusitis as described. Electronically Signed   By: Inez Catalina M.D.   On: 06/17/2016 13:06   Mr Jeri Cos TD Contrast  Result Date: 06/21/2016 CLINICAL DATA:  Initial evaluation for recurrent altered mental status, seizures, history of strep pneumo meningitis. Question new areas of and within the left basal ganglia. EXAM: MRI HEAD WITHOUT AND WITH CONTRAST TECHNIQUE: Multiplanar, multiecho pulse sequences of the brain and surrounding structures were obtained without and with intravenous contrast. CONTRAST:  73m MULTIHANCE GADOBENATE DIMEGLUMINE 529 MG/ML IV SOLN COMPARISON:  Comparison made with prior CT from 06/17/2016 as well as previous MRI from 03/24/2016. FINDINGS: Diffuse prominence of the CSF containing spaces compatible with generalized cerebral atrophy. There has been normal interval expected evolution of previously  identified acute ischemic infarct involving the left corona radiata/basal ganglia, which now demonstrates progressive encephalomalacia without significant diffusion abnormality. Question minimal faint enhancement within this region extending towards the left cerebral peduncle, likely related to the late subacute ischemia (series 11, image 28). Additional remote infarctions involving the bilateral basal ganglia again seen. Chronic blood products again noted within the left external capsule. No new areas of restricted diffusion to suggest new ischemia or injury. Similar FLAIR signal abnormality at the right parietal cortex. Associated subtle laminar necrosis within this region (series 3, image 6). No significant enhancement now seen within this region. Additional focus of cortical flares abnormality within the right frontal lobe slightly more prominent as compared to previous exam, although this appears to be chronic in nature. No appreciable enhancement within these areas. No other abnormal enhancement elsewhere within the brain. Major intracranial vascular flow voids are preserved. No mass lesion, mass effect, or midline shift. Layering susceptibility artifact within the stent was of both lateral ventricles, likely debris/chronic hemorrhage related to prior meningitis, similar to prior. No hydrocephalus. Craniocervical junction within normal limits. Visualized upper cervical spine unremarkable. Pituitary gland normal. No acute abnormality about the globes and orbits. Extensive opacity throughout the paranasal sinuses with fluid levels within the sphenoid and maxillary sinuses. Bilateral mastoid effusions. Fluid within the nasopharynx. Patient likely intubated. Bone marrow signal intensity within normal limits. No scalp soft tissue abnormality. IMPRESSION: 1. Normal expected interval evolution of previously identified infarct involving the left basal ganglia/corona radiata. Additionally, previously seen signal  abnormalities within the bilateral basal ganglia have also largely resolved relative to previous MRI from 03/24/2016. Similarly, changes at the right parietal cortex have also evolved. No new areas of ischemia or injury identified. 2. Persistent layering debris/hemorrhage within the lateral ventricles, likely related to recent history of meningitis. No hydrocephalus. Electronically Signed   By: BJeannine BogaM.D.   On: 06/21/2016 04:55   Dg Chest Port 1 View  Result Date: 07/05/2016 CLINICAL DATA:  Altered mental status EXAM: PORTABLE CHEST 1 VIEW COMPARISON:  06/30/2016 FINDINGS: Stable hazy appearance of the bases which is likely combination of atelectasis and pleural fluid. No edema, air bronchograms, or pneumothorax. Normal heart size and mediastinal contours. IMPRESSION: Stable appearance of the chest. Hazy basilar opacities again favor small effusions and atelectasis. Electronically Signed   By: JMonte FantasiaM.D.   On: 07/05/2016 09:19   Dg Chest Port 1 View  Result Date: 06/30/2016 CLINICAL DATA:  Altered mental status.  Low body temperature. EXAM: PORTABLE CHEST 1 VIEW COMPARISON:  06/25/2016 FINDINGS: Lungs are adequately inflated as patient is slightly rotated to the right. Interval resolution of left  base opacification with improvement but mild residual hazy opacification in the right base possibly due to small layering effusions/atelectasis, although cannot exclude infection cardiomediastinal silhouette and remainder of the exam is unchanged. IMPRESSION: Interval improvement with mild residual hazy opacification in the right base likely small layering effusions/atelectasis although cannot exclude infection. Electronically Signed   By: Marin Olp M.D.   On: 06/30/2016 08:56  Dg Chest Port 1 View  Result Date: 06/25/2016 CLINICAL DATA:  Pneumonia. EXAM: PORTABLE CHEST 1 VIEW COMPARISON:  06/22/2016. FINDINGS: Interval extubation. Left IJ central line tip projects over the SVC. Heart  size normal. Left lower lobe collapse/ consolidation. Hazy opacification over the lower hemithoraces bilaterally, new on the right. IMPRESSION: 1. Left lower lobe collapse/consolidation. 2. Hazy opacification over both lower hemithoraces may be due to layering pleural fluid and/or true airspace opacification. Electronically Signed   By: Lorin Picket M.D.   On: 06/25/2016 10:13   Dg Chest Port 1 View  Result Date: 06/22/2016 CLINICAL DATA:  Respiratory failure, acute encephalopathy, cardiogenic shock, acute and chronic CHF, current smoker. EXAM: PORTABLE CHEST 1 VIEW COMPARISON:  Portable chest x-ray of June 19, 2016 FINDINGS: The right lung is adequately inflated and clear. On the left there is persistent basilar atelectasis or pneumonia with small pleural effusion. The heart is normal in size. The pulmonary vascularity is not engorged. The endotracheal tube tip lies 5.3 cm above the carina. The esophagogastric tube tip projects below the inferior margin of the image. The left internal jugular venous catheter tip projects over the proximal third of the SVC. IMPRESSION: Persistent left lower lobe atelectasis or pneumonia with trace pleural effusion. No CHF. The support tubes are in stable position. Electronically Signed   By: David  Martinique M.D.   On: 06/22/2016 07:20   Dg Chest Portable 1 View  Result Date: 06/19/2016 CLINICAL DATA:  Respiratory failure EXAM: PORTABLE CHEST 1 VIEW COMPARISON:  06/18/2016 FINDINGS: ET tube tip is above the carina. There is a left IJ catheter with tip in the projection of the SVC. Enteric tube tip is below the hemidiaphragm. Persistent opacity within the left lung base. Right lung is clear. IMPRESSION: 1. Stable position of the support apparatus. 2. No change in left base opacity. Electronically Signed   By: Kerby Moors M.D.   On: 06/19/2016 07:44   Dg Chest Port 1 View  Result Date: 06/18/2016 CLINICAL DATA:  50 year old male with a history of respiratory failure  EXAM: PORTABLE CHEST 1 VIEW COMPARISON:  06/17/2016, 06/16/2016 FINDINGS: Cardiomediastinal silhouette unchanged in size and contour. Endotracheal tube unchanged, terminating 3.8 cm above the carina. Unchanged left IJ approach central venous catheter, appearing to terminate superior vena cava. Gastric tube projects over the mediastinum, terminating out of the field of view. No visualized pneumothorax. Improved aeration in the left mid lung, with persistent opacity in the retrocardiac region. Blunting of left costophrenic angle. IMPRESSION: Improved aeration of the left lung, with persistent left basilar opacity, potentially consolidation, edema, and/ or atelectasis. Small left pleural effusion not excluded. Unchanged position of endotracheal tube, gastric tube, left IJ central venous catheter. Signed, Dulcy Fanny. Earleen Newport, DO [facility] Electronically Signed   By: Corrie Mckusick D.O.   On: 06/18/2016 10:15   Dg Chest Port 1 View  Result Date: 06/17/2016 CLINICAL DATA:  50 year old male with enteric tube placement. EXAM: PORTABLE CHEST 1 VIEW COMPARISON:  A abdominal radiograph dated 06/16/2016 FINDINGS: An endotracheal tube is noted with tip approximately 3 cm  above the carina. Single portable view of the chest demonstrates patchy area of hazy airspace opacity in the left mid to lower lung field. There is no pleural effusion or pneumothorax. The cardiac silhouette is within normal limits. Left IJ central line with tip over central SVC. An enteric tube extends into the epigastric area with tip superimposed over the right L3 pedicle likely in the distal stomach. There is no bowel dilatation or evidence of obstruction. No free air. No radiopaque calculi or foreign object. The soft tissues and osseous structures appear unremarkable. The previously seen left femoral central venous catheter has been removed. A temperature probe is partially visualized over the  bladder. IMPRESSION: Endotracheal tube above the carina. Patchy area of airspace density in the left mid to lower lung field. Follow-up recommended. Enteric tube in the distal stomach.  No bowel obstruction. Electronically Signed   By: Anner Crete M.D.   On: 06/17/2016 02:48   Dg Chest Port 1 View  Result Date: 06/16/2016 CLINICAL DATA:  Endotracheal tube placement EXAM: PORTABLE CHEST 1 VIEW COMPARISON:  06/16/2016 FINDINGS: There is an endotracheal tube with the tip 3.7 cm above the carina. Nasogastric tube coursing below the diaphragm. There is hazy left lower lobe airspace disease. There is no pleural effusion or pneumothorax. The heart and mediastinal contours are unremarkable. The osseous structures are unremarkable. IMPRESSION: 1. Hazy left lower lobe airspace disease concerning for pneumonia. 2. Endotracheal tube with the tip 3.7 cm above the carina. Electronically Signed   By: Kathreen Devoid   On: 06/16/2016 22:41   Dg Chest Port 1 View  Result Date: 06/16/2016 CLINICAL DATA:  Intubation.  Followup pneumonia. EXAM: PORTABLE CHEST 1 VIEW 8:24 p.m.: COMPARISON:  Portable chest x-ray earlier same date 5:49 p.m. and previously. FINDINGS: Endotracheal tube tip in satisfactory position projecting approximately 6 cm above the carina. Nasogastric tube courses below the diaphragm into the stomach though its tip is not included on the image. Consolidation in the left lower lobe as noted earlier. Developing patchy opacities at the right lung base. IMPRESSION: 1. Support apparatus satisfactory. Endotracheal tube tip approximately 6 cm above the carina. Nasogastric tube courses below the diaphragm into the stomach. 2. Left lower lobe pneumonia as noted earlier. Developing atelectasis and/or pneumonia involving the right lung base. Electronically Signed   By: Evangeline Dakin M.D.   On: 06/16/2016 20:49   Dg Chest Port 1 View  Result Date: 06/16/2016 CLINICAL DATA:  50 year old with possible aspiration and  sepsis. EXAM: PORTABLE CHEST 1 VIEW COMPARISON:  05/29/2016, 05/27/2016 and earlier. FINDINGS: Patient is rotated to the left. Cardiomediastinal silhouette unremarkable, unchanged. Airspace consolidation in the retrocardiac left lung base. Lungs otherwise clear. Pulmonary vascularity normal. No visible pleural effusions. IMPRESSION: Left lower lobe pneumonia. Electronically Signed   By: Evangeline Dakin M.D.   On: 06/16/2016 18:22   Dg Abd Portable 1v  Result Date: 06/17/2016 CLINICAL DATA:  50 year old male with enteric tube placement. EXAM: PORTABLE CHEST 1 VIEW COMPARISON:  A abdominal radiograph dated 06/16/2016 FINDINGS: An endotracheal tube is noted with tip approximately 3 cm above the carina. Single portable view of the chest demonstrates patchy area of hazy airspace opacity in the left mid to lower lung field. There is no pleural effusion or pneumothorax. The cardiac silhouette is within normal limits. Left IJ central line with tip over central SVC. An enteric tube extends into the epigastric area with tip superimposed over the right L3 pedicle likely in the distal stomach. There is no  bowel dilatation or evidence of obstruction. No free air. No radiopaque calculi or foreign object. The soft tissues and osseous structures appear unremarkable. The previously seen left femoral central venous catheter has been removed. A temperature probe is partially visualized over the bladder. IMPRESSION: Endotracheal tube above the carina. Patchy area of airspace density in the left mid to lower lung field. Follow-up recommended. Enteric tube in the distal stomach.  No bowel obstruction. Electronically Signed   By: Anner Crete M.D.   On: 06/17/2016 02:48   Dg Swallowing Func-speech Pathology  Result Date: 06/29/2016 Objective Swallowing Evaluation: Type of Study: MBS-Modified Barium Swallow Study Patient Details Name: JASEN HARTSTEIN MRN: [id number] Date of Birth: [birth year] Today's Date: 06/29/2016 Time: SLP Start  Time (ACUTE ONLY): 1215-SLP Stop Time (ACUTE ONLY): 1235 SLP Time Calculation (min) (ACUTE ONLY): 20 min Past Medical History: Past Medical History: Diagnosis Date . Acute encephalopathy  . Acute ischemic stroke ([place])  . Acute renal failure ([place])  . Acute respiratory failure with hypoxia ([place])  . AKI (acute kidney injury) ([place]) 02/16/2016 . Altered mental status  . Cardiac arrest ([place])  . Cardiogenic shock ([place])  . 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 Cerebral thrombosis with cerebral infarction 03/25/2016 . CHF (congestive heart failure) ([place])  . Convulsions/seizures ([place]) 05/04/2016  Seizure disorder - Continue Keppra, Depakote.    . Dementia 04/27/2016 . Depression  . Dysphagia 05/04/2016  Dysphagia - Dysphagia 1 diet per SLP    . ETOH abuse  . Hydrocephalus  . Hyperlipidemia 05/04/2016  Dyslipidemia - Continue statin.    Marland Kitchen Hypernatremia  . Hypothermia 03/20/2016 . Low serum cortisol level ([facility]) 05/04/2016  Low a.m. cortisol  - Was 1.2 checked on 04/03/2016  - This likely is reflective of cortisol suppression from steroid use - Tapered steroids off  . Meningitis, pneumococcal, recurrent  . Meningitis, streptococcal 02/16/2016 . Meningoencephalitis  . NSTEMI (non-ST elevated myocardial infarction) ([place]) 03/14/2016 . Protein-calorie malnutrition, severe ([place])  . Respiratory failure ([place]) 03/03/2016 . Septic shock ([place])  . Severe sepsis ([place])  . 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 Substance abuse  . Systolic and diastolic CHF, chronic ([place])  . Tobacco abuse 02/16/2016 . Trichomonal urethritis in male 02/16/2016 . Urinary retention 05/04/2016  Urinary retention - Continue Flomax    Past Surgical History: Past Surgical History: Procedure Laterality Date . ANKLE SURGERY   . CARDIAC CATHETERIZATION N/A 02/17/2016  Procedure: IABP Insertion;  Surgeon: Wellington Hampshire, MD;  Location: [facility];  Service: Cardiovascular;  Laterality: N/A; . 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  Re-admitted 7/13 again presenting with AMS thought to be septic/PNA in origin but had witnessed seizure in ED.  SLP evaluations during previous admissions have recommended Dys 1 diet and nectar thick liquids with advancement to Dys 2 textures prior to discharge.  Intubated 7/13-21.  Subjective: Pt kept eyes closed during MBS. Appeared lethargic, but accepted po presentations Assessment / Plan / Recommendation CHL IP CLINICAL IMPRESSIONS 06/29/2016 Therapy Diagnosis Mild oral phase dysphagia;Moderate pharyngeal phase dysphagia Clinical Impression Pt presents with mild oral, moderate pharyngeal phase dysphagia, characterized by poor bolus formation and posterior propulsion, resulting in posterior spillage  to the vallecula on nectar, honey, and puree consistencies, and at the pyriform sinus on thin liquids. Silent aspiration was noted during the swallow of thin liquids. No penetration  or aspiration was observed on nectar, honey, or puree consistencies, however, risk of episodic aspiration of all consistencies is possible, given poor oral prep and delayed swallow reflex. Solid consistencies were not provided at this time, due to extended ineffective oral bolus formation and propulsion and associated risk of aspiration with fatigue.  Recommend Puree diet and nectar thick liquids with full supervision. Pt needs to be monitored closely during meals to insure oral cavity is clear before provided the next bolus. Safe swallow precautions were sent back to room with pt. Impact on safety and function Moderate aspiration risk   CHL IP TREATMENT RECOMMENDATION  06/29/2016 Treatment Recommendations Therapy as outlined in treatment plan below   Prognosis 06/29/2016 Prognosis for Safe Diet Advancement Fair Barriers to Reach Goals Cognitive deficits CHL IP DIET RECOMMENDATION 06/29/2016 SLP Diet Recommendations Dysphagia 1 (Puree) solids;Nectar thick liquid Liquid Administration via Straw;Cup Medication Administration Crushed with puree Compensations Slow rate;Small sips/bites;Minimize environmental distractions Postural Changes Remain semi-upright after after feeds/meals (Comment);Seated upright at 90 degrees   CHL IP OTHER RECOMMENDATIONS 06/29/2016 Recommended Consults -- Oral Care Recommendations Oral care BID Other Recommendations Order thickener from pharmacy;Prohibited food (jello, ice cream, thin soups);Remove water pitcher   CHL IP FOLLOW UP RECOMMENDATIONS 06/29/2016 Follow up Recommendations Skilled Nursing facility   [facility] IP FREQUENCY AND DURATION 06/29/2016 Speech Therapy Frequency (ACUTE ONLY) min 2x/week Treatment Duration 2 weeks    CHL IP ORAL PHASE 06/29/2016 Oral Phase Impaired Oral - Honey Teaspoon Premature spillage;Reduced posterior propulsion Oral - Nectar Teaspoon Premature spillage;Reduced posterior propulsion;Delayed oral transit Oral - Nectar Straw Premature spillage;Reduced posterior propulsion;Delayed oral transit Oral - Thin Straw Premature spillage;Reduced posterior propulsion;Delayed oral transit Oral - Puree Premature spillage;Delayed oral transit;Reduced posterior propulsion  CHL IP CERVICAL ESOPHAGEAL PHASE 06/29/2016 Cervical Esophageal Phase [facility] Shonna Chock 06/29/2016, 1:27 PM Celia B. Bueche, [facility], CCC-SLP [phone]               Time Spent in minutes  5   Lexington Krotz M.D on 07/10/2016 at 8:49 AM  Between 7am to 7pm - Pager - [phone]  After 7pm go to [email or web address] - password [facility]  [facility] -  Office  [phone]

## 2016-07-10 NOTE — Progress Notes (Signed)
PT Cancellation Note  Patient Details Name: Dale [name] MRN: [id number] DOB: 08/08/1966   Cancelled Treatment:    Reason Eval/Treat Not Completed: Fatigue/lethargy limiting ability to participate - pt sleeping soundly, attempted to arouse, but pt falls back to sleep.  Will reattempt next date.     Narda AmberMartin, Adelena Desantiago [facility] 07/10/2016, 3:24 PM

## 2016-07-11 LAB — CBC
HCT: 28.8 % — ABNORMAL LOW (ref 39.0–52.0)
HEMOGLOBIN: 9 g/dL — AB (ref 13.0–17.0)
MCH: 28.7 pg (ref 26.0–34.0)
MCHC: 31.3 g/dL (ref 30.0–36.0)
MCV: 91.7 fL (ref 78.0–100.0)
Platelets: 209 10*3/uL (ref 150–400)
RBC: 3.14 MIL/uL — ABNORMAL LOW (ref 4.22–5.81)
RDW: 17.7 % — ABNORMAL HIGH (ref 11.5–15.5)
WBC: 6.4 10*3/uL (ref 4.0–10.5)

## 2016-07-11 LAB — COMPREHENSIVE METABOLIC PANEL
ALBUMIN: 2.3 g/dL — AB (ref 3.5–5.0)
ALK PHOS: 45 U/L (ref 38–126)
ALT: 11 U/L — AB (ref 17–63)
ANION GAP: 6 (ref 5–15)
AST: 10 U/L — ABNORMAL LOW (ref 15–41)
BUN: 13 mg/dL (ref 6–20)
CALCIUM: 8.5 mg/dL — AB (ref 8.9–10.3)
CHLORIDE: 107 mmol/L (ref 101–111)
CO2: 35 mmol/L — AB (ref 22–32)
Creatinine, Ser: 0.74 mg/dL (ref 0.61–1.24)
GFR calc Af Amer: >60 mL/min (ref >60)
GFR calc non Af Amer: >60 mL/min (ref >60)
GLUCOSE: 87 mg/dL (ref 65–99)
Potassium: 3.6 mmol/L (ref 3.5–5.1)
SODIUM: 148 mmol/L — AB (ref 135–145)
Total Bilirubin: 0.3 mg/dL (ref 0.3–1.2)
Total Protein: 4.9 g/dL — ABNORMAL LOW (ref 6.5–8.1)

## 2016-07-11 LAB — GLUCOSE, CAPILLARY
GLUCOSE-CAPILLARY: 56 mg/dL — AB (ref 65–99)
Glucose-Capillary: 82 mg/dL (ref 65–99)
Glucose-Capillary: 87 mg/dL (ref 65–99)

## 2016-07-11 LAB — ACTH: C206 ACTH: 19.3 pg/mL (ref 7.2–63.3)

## 2016-07-11 MED ORDER — HYDROCORTISONE 20 MG PO TABS: 100.0000 mg | ORAL_TABLET | Freq: Three times a day (TID) | ORAL | 1 refills | Status: DC

## 2016-07-11 MED ORDER — DIVALPROEX SODIUM 125 MG PO CSDR: 750.0000 mg | DELAYED_RELEASE_CAPSULE | Freq: Three times a day (TID) | ORAL | 1 refills | Status: DC

## 2016-07-11 MED ORDER — POTASSIUM CHLORIDE CRYS ER 20 MEQ PO TBCR: 40.0000 meq | EXTENDED_RELEASE_TABLET | Freq: Every day | ORAL | 1 refills | Status: DC

## 2016-07-11 MED ORDER — FLUDROCORTISONE ACETATE 0.1 MG PO TABS: 0.1000 mg | ORAL_TABLET | Freq: Every day | ORAL | 1 refills | Status: DC

## 2016-07-11 NOTE — Progress Notes (Signed)
Consult for [facility]  Referral given to [place]athy with [facility]; they do not have any open Medicaid beds at this time. Also talked to Corrina with [facility], they do not take Medicaid patients.  Abelino DerrickB Kamariyah Timberlake [facility] HospitalRN,MHA,BSN [phone]

## 2016-07-11 NOTE — Progress Notes (Deleted)
Consult for [facility]  Referral given to [place] with [facility]; they do not have any open Medicaid beds at this time. Abelino DerrickB Tiane Szydlowski [facility] CenterRN,MHA,BSN [phone]

## 2016-07-11 NOTE — Clinical Social Work Note (Signed)
Clinical Social Worker facilitated patient discharge including contacting patient family and facility to confirm patient discharge plans.  Clinical information faxed to facility and family agreeable with plan.  CSW arranged ambulance transport via [facility] to [place].  RN to call report prior to discharge.  Clinical Social Worker will sign off for now as social work intervention is no longer needed. Please consult us again if new need arises.  Jesse Cove Haydon, LCSW [phone] 

## 2016-07-11 NOTE — Discharge Summary (Signed)
Physician Discharge Summary  THORSTEN CLIMER MRN: [id number] DOB/AGE: 04-17-1966 50 y.o.  PCP: No primary care provider on file.   Admit date: 06/16/2016 Discharge date: 07/11/2016  Discharge Diagnoses:    Active Problems:   Acute encephalopathy   Acute respiratory failure (HCC)   Acute respiratory failure with hypercapnia (HCC)   Encounter for central line placement   Pressure ulcer   Hypoglycemia   Bradycardia   Adrenal insufficiency (Addison's disease) ([place])    Follow-up recommendations Follow-up with PCP in 3-5 days , including all  additional recommended appointments as below Follow-up CBC, CMP in 3-5 days    Diet recommendations: Dysphagia 1 (puree);Nectar-thick liquid Liquids provided via: No straw;Cup Medication Administration: Crushed with puree Supervision: Full supervision/cueing for compensatory strategies;Staff to assist with self feeding Compensations: Slow rate;Small sips/bites;Minimize environmental distractions Postural Changes and/or Swallow Maneuvers: Seated upright 90 degrees   Recommend feeding patient every 4 hours to prevent hypoglycemia  recommend transfer to tertiary care center such as [facility] or [facility] for future hospitalizations   Current Discharge Medication List    START taking these medications   Details  bethanechol (URECHOLINE) 10 MG tablet Take 1 tablet (10 mg total) by mouth every 8 (eight) hours. Qty: 90 tablet, Refills: 1    divalproex (DEPAKOTE SPRINKLE) 125 MG capsule Take 6 capsules (750 mg total) by mouth every 8 (eight) hours. Qty: 90 capsule, Refills: 1    fludrocortisone (FLORINEF) 0.1 MG tablet Take 1 tablet (0.1 mg total) by mouth daily. Qty: 30 tablet, Refills: 1    hydrocortisone (CORTEF) 20 MG tablet Take 5 tablets (100 mg total) by mouth 3 (three) times daily with meals. Qty: 90 tablet, Refills: 1    !! Maltodextrin-Xanthan Gum (RESOURCE THICKENUP CLEAR) POWD Take 120 g by mouth as needed. Qty: 1 Can, Refills: 0     potassium chloride SA (K-DUR,KLOR-CON) 20 MEQ tablet Take 2 tablets (40 mEq total) by mouth daily. Qty: 30 tablet, Refills: 1     !! - Potential duplicate medications found. Please discuss with provider.    CONTINUE these medications which have CHANGED   Details  levETIRAcetam (KEPPRA) 100 MG/ML solution Take 15 mLs (1,500 mg total) by mouth 2 (two) times daily. Qty: 473 mL, Refills: 12    LORazepam (ATIVAN) 0.5 MG tablet Take 1 tablet (0.5 mg total) by mouth every 8 (eight) hours as needed for anxiety. Qty: 30 tablet, Refills: 0      CONTINUE these medications which have NOT CHANGED   Details  albuterol (PROVENTIL) (2.5 MG/3ML) 0.083% nebulizer solution Take 3 mLs (2.5 mg total) by nebulization every 3 (three) hours as needed for wheezing. Qty: 75 mL, Refills: 12    aspirin 325 MG tablet Take 1 tablet (325 mg total) by mouth daily. Qty: 30 tablet, Refills: 0    atorvastatin (LIPITOR) 20 MG tablet Take 1 tablet (20 mg total) by mouth daily at 6 PM. Qty: 30 tablet, Refills: 0    famotidine (PEPCID) 20 MG tablet Take 1 tablet (20 mg total) by mouth daily.    feeding supplement, ENSURE ENLIVE, (ENSURE ENLIVE) LIQD Take 237 mLs by mouth every 6 (six) hours. Qty: 237 mL, Refills: 12    folic acid (FOLVITE) 1 MG tablet Take 1 tablet (1 mg total) by mouth daily. Qty: 30 tablet, Refills: 0    !! Maltodextrin-Xanthan Gum ([place]) POWD As needed.    tamsulosin (FLOMAX) 0.4 MG CAPS capsule Take 1 capsule (0.4 mg total) by mouth daily.  Qty: 30 capsule, Refills: 0    thiamine 100 MG tablet Take 1 tablet (100 mg total) by mouth daily. Qty: 30 tablet, Refills: 0     !! - Potential duplicate medications found. Please discuss with provider.    STOP taking these medications     CefTRIAXone Sodium (ROCEPHIN IV)      valproic acid (DEPAKENE) 250 MG/5ML syrup      amoxicillin-clavulanate (AUGMENTIN) 875-125 MG tablet         Overall prognosis poor Discharge  Condition:    Discharge Instructions Get Medicines reviewed and adjusted: Please take all your medications with you for your next visit with your Primary MD  Please request your Primary MD to go over all hospital tests and procedure/radiological results at the follow up, please ask your Primary MD to get all Hospital records sent to his/her office.  If you experience worsening of your admission symptoms, develop shortness of breath, life threatening emergency, suicidal or homicidal thoughts you must seek medical attention immediately by calling 911 or calling your MD immediately if symptoms less severe.  You must read complete instructions/literature along with all the possible adverse reactions/side effects for all the Medicines you take and that have been prescribed to you. Take any new Medicines after you have completely understood and accpet all the possible adverse reactions/side effects.   Do not drive when taking Pain medications.   Do not take more than prescribed Pain, Sleep and Anxiety Medications  Special Instructions: If you have smoked or chewed Tobacco in the last 2 yrs please stop smoking, stop any regular Alcohol and or any Recreational drug use.  Wear Seat belts while driving.  Please note  You were cared for by a hospitalist during your hospital stay. Once you are discharged, your primary care physician will handle any further medical issues. Please note that NO REFILLS for any discharge medications will be authorized once you are discharged, as it is imperative that you return to your primary care physician (or establish a relationship with a primary care physician if you do not have one) for your aftercare needs so that they can reassess your need for medications and monitor your lab values.  Discharge Instructions    Diet - low sodium heart healthy    Complete by:  As directed   Diet - low sodium heart healthy    Complete by:  As directed   Increase activity slowly     Complete by:  As directed   Increase activity slowly    Complete by:  As directed       No Known Allergies    Disposition: 03-Skilled Nursing Facility      Consults  :  Cardiology, neurology,endocrinology,PCCM   Significant Diagnostic Studies:  Dg Pelvis 1-2 Views  Result Date: 07/06/2016 CLINICAL DATA:  Fall EXAM: PELVIS - 1-2 VIEW COMPARISON:  None. FINDINGS: No fracture or dislocation is seen. Follow hip joint spaces are preserved. Visualized bony pelvis appears intact. IMPRESSION: No fracture or dislocation is seen. Electronically Signed   By: Julian Hy M.D.   On: 07/06/2016 20:32   Dg Abd 1 View  Result Date: 06/16/2016 CLINICAL DATA:  Orogastric tube placement. EXAM: ABDOMEN - 1 VIEW COMPARISON:  05/25/2016 FINDINGS: An orogastric tube is now seen with tip overlying the distal stomach in the expected region of the pylorus. A left femoral central venous catheter is seen in place as well as a temperature probe within the bladder. The bowel gas pattern is normal.  IMPRESSION: Orogastric tube tip overlies the distal stomach, in expected region of pylorus. Electronically Signed   By: Earle Gell M.D.   On: 06/16/2016 20:50   Ct Head Wo Contrast  Result Date: 07/06/2016 CLINICAL DATA:  Fall from bed EXAM: CT HEAD WITHOUT CONTRAST TECHNIQUE: Contiguous axial images were obtained from the base of the skull through the vertex without intravenous contrast. COMPARISON:  06/29/2016 FINDINGS: Hypodensity along the left inferior caudate (series 2/ image 14 ; coronal image 30), new from the prior, worrisome for acute/subacute infarct. Adjacent chronic left basal ganglia lacunar infarcts. No evidence of parenchymal hemorrhage or extra-axial fluid collection. No mass lesion, mass effect, or midline shift. Subcortical white matter and periventricular small vessel ischemic changes. Mild global cortical atrophy.  No ventriculomegaly. Partial opacification of the bilateral maxillary and left  sphenoid sinuses. Mastoid air cells are clear. No evidence of calvarial fracture. IMPRESSION: Acute versus subacute infarct involving the left inferior caudate, new from 06/29/2016. No evidence of traumatic injury. These results will be called to the ordering clinician or representative by the Radiologist Assistant, and communication documented in the PACS or zVision Dashboard. Electronically Signed   By: Julian Hy M.D.   On: 07/06/2016 20:25   Ct Head Wo Contrast  Result Date: 06/29/2016 CLINICAL DATA:  Altered mental status EXAM: CT HEAD WITHOUT CONTRAST TECHNIQUE: Contiguous axial images were obtained from the base of the skull through the vertex without intravenous contrast. COMPARISON:  MRI brain dated 06/21/2016 FINDINGS: No evidence of parenchymal hemorrhage or extra-axial fluid collection. No mass lesion, mass effect, or midline shift. No CT evidence of acute infarction. Lacunar infarcts in the left corona radiata and basal ganglia. Subcortical white matter and periventricular small vessel ischemic changes. Cerebral volume is within normal limits.  No ventriculomegaly. New complete opacification of the bilateral maxillary sinuses. Partial opacification of the bilateral ethmoid and sphenoid sinuses. Partial opacification of the right mastoid air cells. No evidence of calvarial fracture. IMPRESSION: No evidence of acute intracranial abnormality. Lacunar infarcts in the left basal ganglia and corona radiata. Electronically Signed   By: Julian Hy M.D.   On: 06/29/2016 16:31  Ct Head Wo Contrast  Result Date: 06/17/2016 CLINICAL DATA:  Unresponsive EXAM: CT HEAD WITHOUT CONTRAST TECHNIQUE: Contiguous axial images were obtained from the base of the skull through the vertex without intravenous contrast. COMPARISON:  05/19/2016 FINDINGS: The bony calvarium is intact. Increased mucosal thickening is noted within the ethmoid and sphenoid sinus. Air-fluid levels are noted within the maxillary  antra bilaterally consistent with a more acute degree of sinusitis. Lacunar infarct is noted within the left basal ganglia and left thalamus extending into the deep white matter new from the prior exam. No findings to suggest acute hemorrhage are noted. IMPRESSION: New areas of subacute to chronic ischemia on the left. Progression in the degree of sinusitis as described. Electronically Signed   By: Inez Catalina M.D.   On: 06/17/2016 13:06   Mr Jeri Cos ZO Contrast  Result Date: 06/21/2016 CLINICAL DATA:  Initial evaluation for recurrent altered mental status, seizures, history of strep pneumo meningitis. Question new areas of and within the left basal ganglia. EXAM: MRI HEAD WITHOUT AND WITH CONTRAST TECHNIQUE: Multiplanar, multiecho pulse sequences of the brain and surrounding structures were obtained without and with intravenous contrast. CONTRAST:  31m MULTIHANCE GADOBENATE DIMEGLUMINE 529 MG/ML IV SOLN COMPARISON:  Comparison made with prior CT from 06/17/2016 as well as previous MRI from 03/24/2016. FINDINGS: Diffuse prominence of the CSF containing  spaces compatible with generalized cerebral atrophy. There has been normal interval expected evolution of previously identified acute ischemic infarct involving the left corona radiata/basal ganglia, which now demonstrates progressive encephalomalacia without significant diffusion abnormality. Question minimal faint enhancement within this region extending towards the left cerebral peduncle, likely related to the late subacute ischemia (series 11, image 28). Additional remote infarctions involving the bilateral basal ganglia again seen. Chronic blood products again noted within the left external capsule. No new areas of restricted diffusion to suggest new ischemia or injury. Similar FLAIR signal abnormality at the right parietal cortex. Associated subtle laminar necrosis within this region (series 3, image 6). No significant enhancement now seen within this  region. Additional focus of cortical flares abnormality within the right frontal lobe slightly more prominent as compared to previous exam, although this appears to be chronic in nature. No appreciable enhancement within these areas. No other abnormal enhancement elsewhere within the brain. Major intracranial vascular flow voids are preserved. No mass lesion, mass effect, or midline shift. Layering susceptibility artifact within the stent was of both lateral ventricles, likely debris/chronic hemorrhage related to prior meningitis, similar to prior. No hydrocephalus. Craniocervical junction within normal limits. Visualized upper cervical spine unremarkable. Pituitary gland normal. No acute abnormality about the globes and orbits. Extensive opacity throughout the paranasal sinuses with fluid levels within the sphenoid and maxillary sinuses. Bilateral mastoid effusions. Fluid within the nasopharynx. Patient likely intubated. Bone marrow signal intensity within normal limits. No scalp soft tissue abnormality. IMPRESSION: 1. Normal expected interval evolution of previously identified infarct involving the left basal ganglia/corona radiata. Additionally, previously seen signal abnormalities within the bilateral basal ganglia have also largely resolved relative to previous MRI from 03/24/2016. Similarly, changes at the right parietal cortex have also evolved. No new areas of ischemia or injury identified. 2. Persistent layering debris/hemorrhage within the lateral ventricles, likely related to recent history of meningitis. No hydrocephalus. Electronically Signed   By: Jeannine Boga M.D.   On: 06/21/2016 04:55   Dg Chest Port 1 View  Result Date: 07/05/2016 CLINICAL DATA:  Altered mental status EXAM: PORTABLE CHEST 1 VIEW COMPARISON:  06/30/2016 FINDINGS: Stable hazy appearance of the bases which is likely combination of atelectasis and pleural fluid. No edema, air bronchograms, or pneumothorax. Normal heart size  and mediastinal contours. IMPRESSION: Stable appearance of the chest. Hazy basilar opacities again favor small effusions and atelectasis. Electronically Signed   By: Monte Fantasia M.D.   On: 07/05/2016 09:19   Dg Chest Port 1 View  Result Date: 06/30/2016 CLINICAL DATA:  Altered mental status.  Low body temperature. EXAM: PORTABLE CHEST 1 VIEW COMPARISON:  06/25/2016 FINDINGS: Lungs are adequately inflated as patient is slightly rotated to the right. Interval resolution of left base opacification with improvement but mild residual hazy opacification in the right base possibly due to small layering effusions/atelectasis, although cannot exclude infection cardiomediastinal silhouette and remainder of the exam is unchanged. IMPRESSION: Interval improvement with mild residual hazy opacification in the right base likely small layering effusions/atelectasis although cannot exclude infection. Electronically Signed   By: Marin Olp M.D.   On: 06/30/2016 08:56  Dg Chest Port 1 View  Result Date: 06/25/2016 CLINICAL DATA:  Pneumonia. EXAM: PORTABLE CHEST 1 VIEW COMPARISON:  06/22/2016. FINDINGS: Interval extubation. Left IJ central line tip projects over the SVC. Heart size normal. Left lower lobe collapse/ consolidation. Hazy opacification over the lower hemithoraces bilaterally, new on the right. IMPRESSION: 1. Left lower lobe collapse/consolidation. 2. Hazy opacification over both  lower hemithoraces may be due to layering pleural fluid and/or true airspace opacification. Electronically Signed   By: Lorin Picket M.D.   On: 06/25/2016 10:13   Dg Chest Port 1 View  Result Date: 06/22/2016 CLINICAL DATA:  Respiratory failure, acute encephalopathy, cardiogenic shock, acute and chronic CHF, current smoker. EXAM: PORTABLE CHEST 1 VIEW COMPARISON:  Portable chest x-ray of June 19, 2016 FINDINGS: The right lung is adequately inflated and clear. On the left there is persistent basilar atelectasis or pneumonia  with small pleural effusion. The heart is normal in size. The pulmonary vascularity is not engorged. The endotracheal tube tip lies 5.3 cm above the carina. The esophagogastric tube tip projects below the inferior margin of the image. The left internal jugular venous catheter tip projects over the proximal third of the SVC. IMPRESSION: Persistent left lower lobe atelectasis or pneumonia with trace pleural effusion. No CHF. The support tubes are in stable position. Electronically Signed   By: David  Martinique M.D.   On: 06/22/2016 07:20   Dg Chest Portable 1 View  Result Date: 06/19/2016 CLINICAL DATA:  Respiratory failure EXAM: PORTABLE CHEST 1 VIEW COMPARISON:  06/18/2016 FINDINGS: ET tube tip is above the carina. There is a left IJ catheter with tip in the projection of the SVC. Enteric tube tip is below the hemidiaphragm. Persistent opacity within the left lung base. Right lung is clear. IMPRESSION: 1. Stable position of the support apparatus. 2. No change in left base opacity. Electronically Signed   By: Kerby Moors M.D.   On: 06/19/2016 07:44   Dg Chest Port 1 View  Result Date: 06/18/2016 CLINICAL DATA:  50 year old male with a history of respiratory failure EXAM: PORTABLE CHEST 1 VIEW COMPARISON:  06/17/2016, 06/16/2016 FINDINGS: Cardiomediastinal silhouette unchanged in size and contour. Endotracheal tube unchanged, terminating 3.8 cm above the carina. Unchanged left IJ approach central venous catheter, appearing to terminate superior vena cava. Gastric tube projects over the mediastinum, terminating out of the field of view. No visualized pneumothorax. Improved aeration in the left mid lung, with persistent opacity in the retrocardiac region. Blunting of left costophrenic angle. IMPRESSION: Improved aeration of the left lung, with persistent left basilar opacity, potentially consolidation, edema, and/ or atelectasis. Small left pleural effusion not excluded. Unchanged position of endotracheal tube,  gastric tube, left IJ central venous catheter. Signed, Dulcy Fanny. Earleen Newport, DO [facility] Electronically Signed   By: Corrie Mckusick D.O.   On: 06/18/2016 10:15   Dg Chest Port 1 View  Result Date: 06/17/2016 CLINICAL DATA:  50 year old male with enteric tube placement. EXAM: PORTABLE CHEST 1 VIEW COMPARISON:  A abdominal radiograph dated 06/16/2016 FINDINGS: An endotracheal tube is noted with tip approximately 3 cm above the carina. Single portable view of the chest demonstrates patchy area of hazy airspace opacity in the left mid to lower lung field. There is no pleural effusion or pneumothorax. The cardiac silhouette is within normal limits. Left IJ central line with tip over central SVC. An enteric tube extends into the epigastric area with tip superimposed over the right L3 pedicle likely in the distal stomach. There is no bowel dilatation or evidence of obstruction. No free air. No radiopaque calculi or foreign object. The soft tissues and osseous structures appear unremarkable. The previously seen left femoral central venous catheter has been removed. A temperature probe is partially visualized over the bladder. IMPRESSION: Endotracheal tube above the carina. Patchy area of airspace density in the left mid to  lower lung field. Follow-up recommended. Enteric tube in the distal stomach.  No bowel obstruction. Electronically Signed   By: Anner Crete M.D.   On: 06/17/2016 02:48   Dg Chest Port 1 View  Result Date: 06/16/2016 CLINICAL DATA:  Endotracheal tube placement EXAM: PORTABLE CHEST 1 VIEW COMPARISON:  06/16/2016 FINDINGS: There is an endotracheal tube with the tip 3.7 cm above the carina. Nasogastric tube coursing below the diaphragm. There is hazy left lower lobe airspace disease. There is no pleural effusion or pneumothorax. The heart and mediastinal contours are unremarkable. The osseous structures are unremarkable. IMPRESSION: 1. Hazy  left lower lobe airspace disease concerning for pneumonia. 2. Endotracheal tube with the tip 3.7 cm above the carina. Electronically Signed   By: Kathreen Devoid   On: 06/16/2016 22:41   Dg Chest Port 1 View  Result Date: 06/16/2016 CLINICAL DATA:  Intubation.  Followup pneumonia. EXAM: PORTABLE CHEST 1 VIEW 8:24 p.m.: COMPARISON:  Portable chest x-ray earlier same date 5:49 p.m. and previously. FINDINGS: Endotracheal tube tip in satisfactory position projecting approximately 6 cm above the carina. Nasogastric tube courses below the diaphragm into the stomach though its tip is not included on the image. Consolidation in the left lower lobe as noted earlier. Developing patchy opacities at the right lung base. IMPRESSION: 1. Support apparatus satisfactory. Endotracheal tube tip approximately 6 cm above the carina. Nasogastric tube courses below the diaphragm into the stomach. 2. Left lower lobe pneumonia as noted earlier. Developing atelectasis and/or pneumonia involving the right lung base. Electronically Signed   By: Evangeline Dakin M.D.   On: 06/16/2016 20:49   Dg Chest Port 1 View  Result Date: 06/16/2016 CLINICAL DATA:  50 year old with possible aspiration and sepsis. EXAM: PORTABLE CHEST 1 VIEW COMPARISON:  05/29/2016, 05/27/2016 and earlier. FINDINGS: Patient is rotated to the left. Cardiomediastinal silhouette unremarkable, unchanged. Airspace consolidation in the retrocardiac left lung base. Lungs otherwise clear. Pulmonary vascularity normal. No visible pleural effusions. IMPRESSION: Left lower lobe pneumonia. Electronically Signed   By: Evangeline Dakin M.D.   On: 06/16/2016 18:22   Dg Abd Portable 1v  Result Date: 06/17/2016 CLINICAL DATA:  50 year old male with enteric tube placement. EXAM: PORTABLE CHEST 1 VIEW COMPARISON:  A abdominal radiograph dated 06/16/2016 FINDINGS: An endotracheal tube is noted with tip approximately 3 cm above the carina. Single portable view of the chest demonstrates  patchy area of hazy airspace opacity in the left mid to lower lung field. There is no pleural effusion or pneumothorax. The cardiac silhouette is within normal limits. Left IJ central line with tip over central SVC. An enteric tube extends into the epigastric area with tip superimposed over the right L3 pedicle likely in the distal stomach. There is no bowel dilatation or evidence of obstruction. No free air. No radiopaque calculi or foreign object. The soft tissues and osseous structures appear unremarkable. The previously seen left femoral central venous catheter has been removed. A temperature probe is partially visualized over the bladder. IMPRESSION: Endotracheal tube above the carina. Patchy area of airspace density in the left mid to lower lung field. Follow-up recommended. Enteric tube in the distal stomach.  No bowel obstruction. Electronically Signed   By: Anner Crete M.D.   On: 06/17/2016 02:48   Dg Swallowing Func-speech Pathology  Result Date: 06/29/2016 Objective Swallowing Evaluation: Type of Study: MBS-Modified Barium Swallow Study Patient Details Name: KINSLER SOEDER MRN: [id number] Date of Birth: 08/29/1966 Today's Date: 06/29/2016 Time: SLP Start Time (ACUTE ONLY): 1215-SLP  Stop Time (ACUTE ONLY): 1235 SLP Time Calculation (min) (ACUTE ONLY): 20 min Past Medical History: Past Medical History: Diagnosis Date . Acute encephalopathy  . Acute ischemic stroke ([place])  . Acute renal failure ([place])  . Acute respiratory failure with hypoxia ([place])  . AKI (acute kidney injury) ([place]) 02/16/2016 . Altered mental status  . Cardiac arrest ([place])  . Cardiogenic shock ([place])  . 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 Cerebral thrombosis with cerebral infarction 03/25/2016 . CHF (congestive heart failure) ([place])  . Convulsions/seizures ([place]) 05/04/2016  Seizure disorder - Continue Keppra, Depakote.    . Dementia 04/27/2016 . Depression  . Dysphagia 05/04/2016  Dysphagia - Dysphagia 1 diet per SLP    . ETOH abuse  . Hydrocephalus   . Hyperlipidemia 05/04/2016  Dyslipidemia - Continue statin.    Marland Kitchen Hypernatremia  . Hypothermia 03/20/2016 . Low serum cortisol level (HCC) 05/04/2016  Low a.m. cortisol  - Was 1.2 checked on 04/03/2016  - This likely is reflective of cortisol suppression from steroid use - Tapered steroids off  . Meningitis, pneumococcal, recurrent  . Meningitis, streptococcal 02/16/2016 . Meningoencephalitis  . NSTEMI (non-ST elevated myocardial infarction) ([place]) 03/14/2016 . Protein-calorie malnutrition, severe ([place])  . Respiratory failure ([place]) 03/03/2016 . Septic shock ([place])  . Severe sepsis ([place])  . 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 Substance abuse  . Systolic and diastolic CHF, chronic ([place])  . Tobacco abuse 02/16/2016 . Trichomonal urethritis in male 02/16/2016 . Urinary retention 05/04/2016  Urinary retention - Continue Flomax    Past Surgical History: Past Surgical History: Procedure Laterality Date . ANKLE SURGERY   . CARDIAC CATHETERIZATION N/A 02/17/2016  Procedure: IABP Insertion;  Surgeon: Wellington Hampshire, MD;  Location: [place] [facility];  Service: Cardiovascular;  Laterality: N/A; . TEE WITHOUT CARDIOVERSION N/A 03/29/2016  Procedure: TRANSESOPHAGEAL ECHOCARDIOGRAM (TEE);  Surgeon: Larey Dresser, MD;  Location: [facility] ENDOSCOPY;  Service: Cardiovascular;  Laterality: N/A; HPI: 49 year old male with h/o strep pneumonia meningitis, dysphagia, prior trach (3/17),dementia and hospital course complicated by cardiac arrest and cardiogenic shock in March 2016, ARF, shock liver, adrenal insufficiency presented with acute encephalopathy June admission.  Re-admitted 7/13 again presenting with AMS thought to be septic/PNA in origin but had witnessed seizure in ED.  SLP evaluations during previous admissions have recommended Dys 1 diet and nectar thick liquids with advancement to Dys 2 textures prior to discharge.  Intubated 7/13-21.  Subjective: Pt kept eyes closed during MBS. Appeared lethargic, but accepted po presentations  Assessment / Plan / Recommendation CHL IP CLINICAL IMPRESSIONS 06/29/2016 Therapy Diagnosis Mild oral phase dysphagia;Moderate pharyngeal phase dysphagia Clinical Impression Pt presents with mild oral, moderate pharyngeal phase dysphagia, characterized by poor bolus formation and posterior propulsion, resulting in posterior spillage to the vallecula on nectar, honey, and puree consistencies, and at the pyriform sinus on thin liquids. Silent aspiration was noted during the swallow of thin liquids. No penetration  or aspiration was observed on nectar, honey, or puree consistencies, however, risk of episodic aspiration of all consistencies is possible, given poor oral prep and delayed swallow reflex. Solid consistencies were not provided at this time, due to extended ineffective oral bolus formation and propulsion and associated risk of aspiration with fatigue.  Recommend Puree diet and nectar thick liquids with full supervision. Pt needs to be monitored closely during meals to insure oral cavity is clear before provided the next bolus. Safe swallow precautions were sent back to room with pt. Impact on safety and function Moderate aspiration risk  CHL IP TREATMENT RECOMMENDATION 06/29/2016 Treatment Recommendations Therapy as outlined in treatment plan below   Prognosis 06/29/2016 Prognosis for Safe Diet Advancement Fair Barriers to Reach Goals Cognitive deficits CHL IP DIET RECOMMENDATION 06/29/2016 SLP Diet Recommendations Dysphagia 1 (Puree) solids;Nectar thick liquid Liquid Administration via Straw;Cup Medication Administration Crushed with puree Compensations Slow rate;Small sips/bites;Minimize environmental distractions Postural Changes Remain semi-upright after after feeds/meals (Comment);Seated upright at 90 degrees   CHL IP OTHER RECOMMENDATIONS 06/29/2016 Recommended Consults -- Oral Care Recommendations Oral care BID Other Recommendations Order thickener from pharmacy;Prohibited food (jello, ice cream, thin  soups);Remove water pitcher   CHL IP FOLLOW UP RECOMMENDATIONS 06/29/2016 Follow up Recommendations Skilled Nursing facility   [facility] IP FREQUENCY AND DURATION 06/29/2016 Speech Therapy Frequency (ACUTE ONLY) min 2x/week Treatment Duration 2 weeks    CHL IP ORAL PHASE 06/29/2016 Oral Phase Impaired Oral - Honey Teaspoon Premature spillage;Reduced posterior propulsion Oral - Nectar Teaspoon Premature spillage;Reduced posterior propulsion;Delayed oral transit Oral - Nectar Straw Premature spillage;Reduced posterior propulsion;Delayed oral transit Oral - Thin Straw Premature spillage;Reduced posterior propulsion;Delayed oral transit Oral - Puree Premature spillage;Delayed oral transit;Reduced posterior propulsion  CHL IP CERVICAL ESOPHAGEAL PHASE 06/29/2016 Cervical Esophageal Phase [facility] Shonna Chock 06/29/2016, 1:27 PM Celia B. Dennison, [place], [place]                2-D echo LV EF: 40% -   45%  ------------------------------------------------------------------- History:   PMH:  Cardiomyopathy- Unspecified.  Altered mental status.  Congestive heart failure.  PMH:   Myocardial infarction. Risk factors:  Current tobacco use. Dyslipidemia.  ------------------------------------------------------------------- Study Conclusions  - Left ventricle: The cavity size was normal. Wall thickness was   normal. LV apical false tendon. Systolic function was mildly to   moderately reduced. The estimated ejection fraction was in the   range of 40% to 45%. Diffuse hypokinesis. The study is not   technically sufficient to allow evaluation of LV diastolic   function. - Mitral valve: Structurally normal valve. There was trivial   regurgitation. - Left atrium: The atrium was mildly dilated. - Right ventricle: The cavity size was mildly dilated. Systolic   function is reduced. - Tricuspid valve: There was trivial regurgitation. - Pulmonary arteries: PA peak pressure: 21 mm Hg (S). - Inferior vena cava: The  vessel was normal in size. The   respirophasic diameter changes were in the normal range (>= 50%),   consistent with normal central venous pressure.  Impressions:  - Compared to a prior study in 05/2016, the LVEF is slightly lower   at 40-45%. Global hypokinesis.    Filed Weights   07/08/16 0525 07/09/16 0700 07/11/16 0551  Weight: 65.3 kg (144 lb) 64.9 kg (143 lb) 60.3 kg (133 lb)     Microbiology: No results found for this or any previous visit (from the past 240 hour(s)).     Blood Culture    Component Value Date/Time   SDES BLOOD RIGHT HAND 06/30/2016 1010   SPECREQUEST  06/30/2016 1010    BOTTLES DRAWN AEROBIC AND ANAEROBIC 10CC AER [place] ANA   CULT NO GROWTH 5 DAYS 06/30/2016 1010   REPTSTATUS 07/05/2016 FINAL 06/30/2016 1010      Labs: Results for orders placed or performed during the hospital encounter of 06/16/16 (from the past 48 hour(s))  Glucose, capillary     Status: None   Collection Time: 07/09/16 12:28 PM  Result Value Ref Range   Glucose-Capillary 93 65 - 99 mg/dL   Comment 1 Notify RN  Comment 2 Document in Chart   Ammonia     Status: None   Collection Time: 07/09/16  4:17 PM  Result Value Ref Range   Ammonia 21 9 - 35 umol/L  Valproic acid level     Status: None   Collection Time: 07/09/16  4:18 PM  Result Value Ref Range   Valproic Acid Lvl 88 50.0 - 100.0 ug/mL  Magnesium     Status: None   Collection Time: 07/09/16  4:18 PM  Result Value Ref Range   Magnesium 2.0 1.7 - 2.4 mg/dL  Glucose, capillary     Status: Abnormal   Collection Time: 07/09/16  4:52 PM  Result Value Ref Range   Glucose-Capillary 55 (L) 65 - 99 mg/dL  Glucose, capillary     Status: None   Collection Time: 07/09/16 10:29 PM  Result Value Ref Range   Glucose-Capillary 72 65 - 99 mg/dL  Glucose, capillary     Status: None   Collection Time: 07/10/16  1:48 AM  Result Value Ref Range   Glucose-Capillary 75 65 - 99 mg/dL  Basic metabolic panel     Status: Abnormal    Collection Time: 07/10/16  4:09 AM  Result Value Ref Range   Sodium 148 (H) 135 - 145 mmol/L   Potassium 4.5 3.5 - 5.1 mmol/L    Comment: DELTA CHECK NOTED   Chloride 104 101 - 111 mmol/L   CO2 36 (H) 22 - 32 mmol/L   Glucose, Bld 95 65 - 99 mg/dL   BUN 13 6 - 20 mg/dL   Creatinine, Ser 0.84 0.61 - 1.24 mg/dL   Calcium 8.8 (L) 8.9 - 10.3 mg/dL   GFR calc non Af Amer >60 >60 mL/min   GFR calc Af Amer >60 >60 mL/min    Comment: (NOTE) The eGFR has been calculated using the CKD EPI equation. This calculation has not been validated in all clinical situations. eGFR's persistently <60 mL/min signify possible Chronic Kidney Disease.    Anion gap 8 5 - 15  Glucose, capillary     Status: None   Collection Time: 07/10/16  6:59 AM  Result Value Ref Range   Glucose-Capillary 79 65 - 99 mg/dL  Glucose, capillary     Status: None   Collection Time: 07/10/16 12:03 PM  Result Value Ref Range   Glucose-Capillary 75 65 - 99 mg/dL   Comment 1 Notify RN    Comment 2 Document in Chart   Glucose, capillary     Status: None   Collection Time: 07/10/16  4:25 PM  Result Value Ref Range   Glucose-Capillary 81 65 - 99 mg/dL   Comment 1 Notify RN    Comment 2 Document in Chart   Glucose, capillary     Status: None   Collection Time: 07/10/16  8:59 PM  Result Value Ref Range   Glucose-Capillary 99 65 - 99 mg/dL   Comment 1 Notify RN    Comment 2 Document in Chart   Comprehensive metabolic panel     Status: Abnormal   Collection Time: 07/11/16  4:00 AM  Result Value Ref Range   Sodium 148 (H) 135 - 145 mmol/L   Potassium 3.6 3.5 - 5.1 mmol/L    Comment: DELTA CHECK NOTED   Chloride 107 101 - 111 mmol/L   CO2 35 (H) 22 - 32 mmol/L   Glucose, Bld 87 65 - 99 mg/dL   BUN 13 6 - 20 mg/dL   Creatinine, Ser 0.74 0.61 -  1.24 mg/dL   Calcium 8.5 (L) 8.9 - 10.3 mg/dL   Total Protein 4.9 (L) 6.5 - 8.1 g/dL   Albumin 2.3 (L) 3.5 - 5.0 g/dL   AST 10 (L) 15 - 41 U/L   ALT 11 (L) 17 - 63 U/L    Alkaline Phosphatase 45 38 - 126 U/L   Total Bilirubin 0.3 0.3 - 1.2 mg/dL   GFR calc non Af Amer >60 >60 mL/min   GFR calc Af Amer >60 >60 mL/min    Comment: (NOTE) The eGFR has been calculated using the CKD EPI equation. This calculation has not been validated in all clinical situations. eGFR's persistently <60 mL/min signify possible Chronic Kidney Disease.    Anion gap 6 5 - 15  CBC     Status: Abnormal   Collection Time: 07/11/16  4:00 AM  Result Value Ref Range   WBC 6.4 4.0 - 10.5 K/uL   RBC 3.14 (L) 4.22 - 5.81 MIL/uL   Hemoglobin 9.0 (L) 13.0 - 17.0 g/dL   HCT 34.9 (L) 41.4 - 44.0 %   MCV 91.7 78.0 - 100.0 fL   MCH 28.7 26.0 - 34.0 pg   MCHC 31.3 30.0 - 36.0 g/dL   RDW 73.5 (H) 99.7 - 60.8 %   Platelets 209 150 - 400 K/uL  Glucose, capillary     Status: Abnormal   Collection Time: 07/11/16  7:12 AM  Result Value Ref Range   Glucose-Capillary 56 (L) 65 - 99 mg/dL  Glucose, capillary     Status: None   Collection Time: 07/11/16  7:37 AM  Result Value Ref Range   Glucose-Capillary 82 65 - 99 mg/dL     Lipid Panel     Component Value Date/Time   CHOL 161 03/27/2016 0534   TRIG 100 06/17/2016 0357   HDL 53 03/27/2016 0534   CHOLHDL 3.0 03/27/2016 0534   VLDL 16 03/27/2016 0534   LDLCALC 92 03/27/2016 0534     Lab Results  Component Value Date   HGBA1C 5.3 05/20/2016   HGBA1C 5.8 (H) 03/27/2016     Lab Results  Component Value Date   LDLCALC 92 03/27/2016   CREATININE 0.74 07/11/2016     HPI   50 year old male with complex medical history including:  3/14-4/6: Admitted with strep pneumo meningitis, c/b cardiac arrest, and cardiogenic shock (EF 20%, recovered to 50%) briefly on amiodarone, ARDS requiring prolonged intubation and trach (now removed), ARF, shock liver, and adrenal insufficiency. He was discharged on 4/6 with weekly Pen G IM until 4/10. Has been re-admitted several times w/ recurrent infection and seen by ID. Re-admitted 7/13 again  presenting with AMS thought to be septic/PNA in origin but had witnessed seizure in ED. He was found to be hypotensive and hypothermic, however WBC and Lactic were wnl. CXR with LLL opacification He required CVL placement and vasopressors for shock despite aggressive IVF resuscitation. ICU admission for shock treatment and continuous EEG. He was intubated in the ED for airway protection and transferred to [facility]. Extubated 7/21. Transferred to [facility] on 7/25.Patient develops signs of adrenal insufficiency again on 7/26 and had to be moved back to stepdown  HOSPITAL COURSE: *   Hypoglycemia due to adrenal insufficiency-this is a recurrent problem Unable to tolerate weaning of high-dose oral hydrocortisone,now on high-dose oral steroids per endocrinology recommendations ,Continue 100 mg TID,PO, also on Florinef Of D10 infusion and monitor CBGs, continues to have hypoglycemic episodes, recommend frequent feeding every 2-3 hours  Monitor CBGs every 4 hours on increased dose of steroids and frequent feeding and see what happens Suspect hypothalamic dysregulation, ACTH deficiency, endocrinology does not think that hypothermia and hypoglycemia is connected with adrenal insufficiency,  ACTH levels pending Overall prognosis poor, recommend transfer to tertiary care center such as [facility] or [facility] for future hospitalizations, this was recommended by Multiple consultants     Bradycardia >currently sinus in 40's-80's H/O Cardiac Arrest w/ NSTEMI H/O Systolic CHF - EF 45 - 12% per echo from June 2017 H/O Hyperlipidemia Continue aspirin and Lipitor Cardiology was consulted for bradycardia, sinus bradycardia with narrow QRS complex, no need for pacemaker, avoid AV nodal blocking drugs   Acute Encephalopathy - ongoing. Following commands. Waxing and waning mental status, repeat CT scan on 7/26 did not show any evidence of acute intracranial abnormality. 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 Dwyane Dee from endocrinology Has not been able to tolerate oral hydrocortisone thus far frequently switching back to IV hydrocortisone, however despite very high dose oral steroids and Florinef, patient has not improved, Cortisol level was 4.3 on admission, was 1.2 on 4/17 Continue hydrocortisone and Florinef as recommended by endocrinology  Seizures. -strep pneumo meningitis earlier this year, Continue with Keppra and Depakote, dose adjusted during this hospitalization, continue prn Ativan for seizures,Imani appears to be waxing and waning in terms of his mental status.He has no evidence of any ongoing seizure . Depakote level therapeutic    New Subacute Ischemia - extensive workup by neurology as follows H/O strep pneumo meningitis, hydrocephalus, new CVA.Seizure disorder Neurology suspectsmild left upper extremity weakness related to the left basal ganglia infarction.  CT head 7/14:New areas of subacute to chronic ischemia on the left. Progression in the degree of sinusitis as described. EEG 7/14:This was an abnormal EEG due to the presence of moderate background slowing. This slowing is non-specific and is indicative of encephalopathy. MRI 7/18:1. Normal expected interval evolution of previously identified infarct involving the left basal ganglia/corona radiata. Additionally, previously seen signal abnormalities within the bilateral basal ganglia have also largely resolved relative to previous MRI from 03/24/2016. Similarly, changes at the right parietal cortex have also evolved. No new areas of ischemia orinjury identified.  Persistent layering debris/hemorrhage within the lateral ventricles, likely  related to recent history of meningitis. No Hydrocephalus. Continuous EEG 7/20: Mild diffuse encephalopathy. No electrographic seizures. Patient remains at high-risk for aspiration with waxing and waning mental status   Sepsis - Possible HCAP-->completed abx course  h/O strep pneumo meningitis (march 2017). Received repeat doses empiric vanco and zosyn from 7/13 to 7/22 due to hypothermia, cx's performed No evidence of any underlying infection at this time   Acute Hypoxic Respiratory Failure  was Unable to Protect Airway in the setting of seizures.Intubated from 7/13-7/21. HCAP , chest x-ray on 7/22 showed left lower lobe collapse, chest x-ray on 7/27 showed interval improvement with residual hazy opacification in the right base H/O Tracheostomy March 2017 - S/P Decannulation. Mobilize-will need rehabilitation However has been stable from a pulmonary standpoint  Shock - Resolved. Possibly secondary to sepsis.   Hypokalemia - resolved  Hypernatremia/Hyperchloremia - Mild. Continue to encourage by mouth intake  Urinary retention - Foley replaced.,  Trending UOP with Foley. Bethanechol started 7/18.Continue  Flomax  Dysphagia  Dysphagia 1 (puree) nectar thick liquid based on speech therapy evaluation   Anemia of chronic disease No signs of active bleeding.  Stable,      Overall prognosis. Discussed with patient's wife and entire extended family, patient has not improved neurologically over the last several days of this admission, do not anticipate any meaningful recovery,no stabilization of vital signs ,  wife feels that the patient' cannot be left to die, making him a DNR would be like killing him, if his vital signs are abnormal, she would like to bring him back,. Suggested that patient is not a candidate for intubation in the future, recommended DNR/DNI, at this point they have declined palliative care recommendations as well as recommendations multiple  physicians consulting on the patient. He remains at high risk of aspiration, reintubation, readmission,his vitals change every day , he is unstable on daily basis, high risk for readmission  Discharge Exam:  Blood pressure 120/72, pulse (!) 39, temperature 97.6 F (36.4 C), temperature source Oral, resp. rate 16, height 5' 8" (1.727 m), weight 60.3 kg (133 lb), SpO2 94 %.     No new F.N deficits, Normal affect [place].AT,PERRAL Supple Neck,No JVD, No cervical lymphadenopathy appriciated.  Symmetrical Chest wall movement, Good air movement bilaterally, CTAB RRR,No Gallops,Rubs or new Murmurs, No Parasternal Heave +ve B.Sounds, Abd Soft, No tenderness, No organomegaly appriciated, No rebound - guarding or rigidity. No Cyanosis, Clubbing or edema, No new Rash or bruise  Pt able to move all 4 ext, pt able to sit up in bed   Follow-up Information    pcp. Schedule an appointment as soon as possible for a visit in 2 day(s).   Why:  hospital follow up       Primary care provider. Schedule an appointment as soon as possible for a visit in 2 day(s).   Why:  Hospital follow-up       KUMAR,AJAY, MD. Schedule an appointment as soon as possible for a visit in 1 week(s).   Specialty:  Endocrinology Why:  Hospital follow-up Contact information: [address]   [id number] [phone]           Signed: Reyne Dumas 07/11/2016, 12:07 PM        Time spent >45 mins

## 2016-07-11 NOTE — Progress Notes (Signed)
Patient discharging to SNF, unable to make follow up apt; SNF to make apt when pt discharge from facility. Abelino DerrickB Minda Faas [facility] Medical CenterRN,MHA,BSN [phone]

## 2016-07-11 NOTE — Progress Notes (Signed)
PT Cancellation Note  Patient Details Name: Dale [name] MRN: [id number] DOB: 03/23/1966   Cancelled Treatment:    Reason Eval/Treat Not Completed: Fatigue/lethargy limiting ability to participate   Patient sleeping on arrival. Barely opens eyes when aroused with tactile and verbal stimulation. Nurse tech reports he's "had something to make him sleepy."    Rashed Edler 07/11/2016, 1:17 PM Pager [phone]

## 2016-07-12 ENCOUNTER — Emergency Department (HOSPITAL_COMMUNITY): Payer: Medicaid Other

## 2016-07-12 ENCOUNTER — Encounter (HOSPITAL_COMMUNITY): Payer: Self-pay | Admitting: *Deleted

## 2016-07-12 ENCOUNTER — Inpatient Hospital Stay (HOSPITAL_COMMUNITY)
Admission: EM | Admit: 2016-07-12 | Discharge: 2016-08-10 | DRG: 004 | Disposition: A | Discharge status: Skilled Nursing Facility | Payer: Medicaid Other | Attending: Internal Medicine | Admitting: Internal Medicine

## 2016-07-12 DIAGNOSIS — F1010 Alcohol abuse, uncomplicated: Secondary | ICD-10-CM | POA: Diagnosis present

## 2016-07-12 DIAGNOSIS — J9690 Respiratory failure, unspecified, unspecified whether with hypoxia or hypercapnia: Secondary | ICD-10-CM

## 2016-07-12 DIAGNOSIS — Z681 Body mass index (BMI) 19 or less, adult: Secondary | ICD-10-CM

## 2016-07-12 DIAGNOSIS — I959 Hypotension, unspecified: Secondary | ICD-10-CM | POA: Diagnosis not present

## 2016-07-12 DIAGNOSIS — Z8661 Personal history of infections of the central nervous system: Secondary | ICD-10-CM

## 2016-07-12 DIAGNOSIS — J9600 Acute respiratory failure, unspecified whether with hypoxia or hypercapnia: Secondary | ICD-10-CM

## 2016-07-12 DIAGNOSIS — Z930 Tracheostomy status: Secondary | ICD-10-CM

## 2016-07-12 DIAGNOSIS — T68XXXA Hypothermia, initial encounter: Secondary | ICD-10-CM | POA: Diagnosis not present

## 2016-07-12 DIAGNOSIS — L89152 Pressure ulcer of sacral region, stage 2: Secondary | ICD-10-CM | POA: Diagnosis not present

## 2016-07-12 DIAGNOSIS — J189 Pneumonia, unspecified organism: Secondary | ICD-10-CM | POA: Diagnosis present

## 2016-07-12 DIAGNOSIS — Z9289 Personal history of other medical treatment: Secondary | ICD-10-CM

## 2016-07-12 DIAGNOSIS — G40909 Epilepsy, unspecified, not intractable, without status epilepticus: Secondary | ICD-10-CM | POA: Diagnosis present

## 2016-07-12 DIAGNOSIS — A047 Enterocolitis due to Clostridium difficile: Secondary | ICD-10-CM | POA: Diagnosis present

## 2016-07-12 DIAGNOSIS — I639 Cerebral infarction, unspecified: Secondary | ICD-10-CM

## 2016-07-12 DIAGNOSIS — E271 Primary adrenocortical insufficiency: Secondary | ICD-10-CM | POA: Diagnosis present

## 2016-07-12 DIAGNOSIS — Z7982 Long term (current) use of aspirin: Secondary | ICD-10-CM | POA: Diagnosis not present

## 2016-07-12 DIAGNOSIS — Z5900 Homelessness unspecified: Secondary | ICD-10-CM

## 2016-07-12 DIAGNOSIS — R400 Somnolence: Secondary | ICD-10-CM | POA: Diagnosis not present

## 2016-07-12 DIAGNOSIS — T424X5A Adverse effect of benzodiazepines, initial encounter: Secondary | ICD-10-CM | POA: Diagnosis present

## 2016-07-12 DIAGNOSIS — I469 Cardiac arrest, cause unspecified: Secondary | ICD-10-CM | POA: Diagnosis not present

## 2016-07-12 DIAGNOSIS — T404X5A Adverse effect of other synthetic narcotics, initial encounter: Secondary | ICD-10-CM | POA: Diagnosis present

## 2016-07-12 DIAGNOSIS — Z8619 Personal history of other infectious and parasitic diseases: Secondary | ICD-10-CM

## 2016-07-12 DIAGNOSIS — I5042 Chronic combined systolic (congestive) and diastolic (congestive) heart failure: Secondary | ICD-10-CM | POA: Diagnosis present

## 2016-07-12 DIAGNOSIS — F17210 Nicotine dependence, cigarettes, uncomplicated: Secondary | ICD-10-CM | POA: Diagnosis present

## 2016-07-12 DIAGNOSIS — E43 Unspecified severe protein-calorie malnutrition: Secondary | ICD-10-CM | POA: Diagnosis present

## 2016-07-12 DIAGNOSIS — I6330 Cerebral infarction due to thrombosis of unspecified cerebral artery: Secondary | ICD-10-CM | POA: Diagnosis present

## 2016-07-12 DIAGNOSIS — E876 Hypokalemia: Secondary | ICD-10-CM | POA: Diagnosis not present

## 2016-07-12 DIAGNOSIS — Z79899 Other long term (current) drug therapy: Secondary | ICD-10-CM

## 2016-07-12 DIAGNOSIS — F0390 Unspecified dementia without behavioral disturbance: Secondary | ICD-10-CM | POA: Diagnosis present

## 2016-07-12 DIAGNOSIS — A419 Sepsis, unspecified organism: Principal | ICD-10-CM | POA: Diagnosis present

## 2016-07-12 DIAGNOSIS — J156 Pneumonia due to other aerobic Gram-negative bacteria: Secondary | ICD-10-CM | POA: Diagnosis present

## 2016-07-12 DIAGNOSIS — E870 Hyperosmolality and hypernatremia: Secondary | ICD-10-CM | POA: Diagnosis present

## 2016-07-12 DIAGNOSIS — N390 Urinary tract infection, site not specified: Secondary | ICD-10-CM | POA: Diagnosis present

## 2016-07-12 DIAGNOSIS — E46 Unspecified protein-calorie malnutrition: Secondary | ICD-10-CM | POA: Diagnosis present

## 2016-07-12 DIAGNOSIS — E162 Hypoglycemia, unspecified: Secondary | ICD-10-CM | POA: Diagnosis not present

## 2016-07-12 DIAGNOSIS — R4182 Altered mental status, unspecified: Secondary | ICD-10-CM

## 2016-07-12 DIAGNOSIS — R570 Cardiogenic shock: Secondary | ICD-10-CM | POA: Diagnosis present

## 2016-07-12 DIAGNOSIS — J9621 Acute and chronic respiratory failure with hypoxia: Secondary | ICD-10-CM | POA: Diagnosis present

## 2016-07-12 DIAGNOSIS — Y95 Nosocomial condition: Secondary | ICD-10-CM | POA: Diagnosis present

## 2016-07-12 DIAGNOSIS — I4891 Unspecified atrial fibrillation: Secondary | ICD-10-CM | POA: Diagnosis present

## 2016-07-12 DIAGNOSIS — Z8673 Personal history of transient ischemic attack (TIA), and cerebral infarction without residual deficits: Secondary | ICD-10-CM

## 2016-07-12 DIAGNOSIS — J690 Pneumonitis due to inhalation of food and vomit: Secondary | ICD-10-CM | POA: Diagnosis not present

## 2016-07-12 DIAGNOSIS — J15212 Pneumonia due to Methicillin resistant Staphylococcus aureus: Secondary | ICD-10-CM | POA: Diagnosis present

## 2016-07-12 DIAGNOSIS — I214 Non-ST elevation (NSTEMI) myocardial infarction: Secondary | ICD-10-CM | POA: Diagnosis present

## 2016-07-12 DIAGNOSIS — Z515 Encounter for palliative care: Secondary | ICD-10-CM | POA: Diagnosis not present

## 2016-07-12 DIAGNOSIS — E785 Hyperlipidemia, unspecified: Secondary | ICD-10-CM | POA: Diagnosis not present

## 2016-07-12 DIAGNOSIS — R569 Unspecified convulsions: Secondary | ICD-10-CM

## 2016-07-12 DIAGNOSIS — B9562 Methicillin resistant Staphylococcus aureus infection as the cause of diseases classified elsewhere: Secondary | ICD-10-CM | POA: Diagnosis not present

## 2016-07-12 DIAGNOSIS — J1569 Pneumonia due to other gram-negative bacteria: Secondary | ICD-10-CM | POA: Diagnosis present

## 2016-07-12 DIAGNOSIS — D638 Anemia in other chronic diseases classified elsewhere: Secondary | ICD-10-CM | POA: Diagnosis present

## 2016-07-12 DIAGNOSIS — R6521 Severe sepsis with septic shock: Secondary | ICD-10-CM | POA: Diagnosis present

## 2016-07-12 DIAGNOSIS — R1310 Dysphagia, unspecified: Secondary | ICD-10-CM

## 2016-07-12 DIAGNOSIS — G9340 Encephalopathy, unspecified: Secondary | ICD-10-CM | POA: Diagnosis not present

## 2016-07-12 DIAGNOSIS — A539 Syphilis, unspecified: Secondary | ICD-10-CM | POA: Diagnosis not present

## 2016-07-12 DIAGNOSIS — J9601 Acute respiratory failure with hypoxia: Secondary | ICD-10-CM | POA: Diagnosis not present

## 2016-07-12 DIAGNOSIS — A0472 Enterocolitis due to Clostridium difficile, not specified as recurrent: Secondary | ICD-10-CM | POA: Diagnosis not present

## 2016-07-12 DIAGNOSIS — G9349 Other encephalopathy: Secondary | ICD-10-CM | POA: Diagnosis present

## 2016-07-12 DIAGNOSIS — I252 Old myocardial infarction: Secondary | ICD-10-CM

## 2016-07-12 DIAGNOSIS — Z4659 Encounter for fitting and adjustment of other gastrointestinal appliance and device: Secondary | ICD-10-CM

## 2016-07-12 DIAGNOSIS — Z0189 Encounter for other specified special examinations: Secondary | ICD-10-CM

## 2016-07-12 DIAGNOSIS — L8990 Pressure ulcer of unspecified site, unspecified stage: Secondary | ICD-10-CM | POA: Diagnosis present

## 2016-07-12 DIAGNOSIS — T43595A Adverse effect of other antipsychotics and neuroleptics, initial encounter: Secondary | ICD-10-CM | POA: Diagnosis present

## 2016-07-12 DIAGNOSIS — F1910 Other psychoactive substance abuse, uncomplicated: Secondary | ICD-10-CM | POA: Diagnosis present

## 2016-07-12 DIAGNOSIS — Z430 Encounter for attention to tracheostomy: Secondary | ICD-10-CM

## 2016-07-12 DIAGNOSIS — R7881 Bacteremia: Secondary | ICD-10-CM | POA: Diagnosis not present

## 2016-07-12 DIAGNOSIS — Z720 Tobacco use: Secondary | ICD-10-CM | POA: Diagnosis present

## 2016-07-12 LAB — CBC WITH DIFFERENTIAL/PLATELET
BASOS ABS: 0 10*3/uL (ref 0.0–0.1)
Basophils Relative: 0 %
Eosinophils Absolute: 0 10*3/uL (ref 0.0–0.7)
Eosinophils Relative: 0 %
HCT: 29.3 % — ABNORMAL LOW (ref 39.0–52.0)
Hemoglobin: 9.5 g/dL — ABNORMAL LOW (ref 13.0–17.0)
LYMPHS ABS: 1.8 10*3/uL (ref 0.7–4.0)
Lymphocytes Relative: 15 %
MCH: 29.8 pg (ref 26.0–34.0)
MCHC: 32.4 g/dL (ref 30.0–36.0)
MCV: 91.8 fL (ref 78.0–100.0)
MONOS PCT: 15 %
Monocytes Absolute: 1.8 10*3/uL — ABNORMAL HIGH (ref 0.1–1.0)
Neutro Abs: 8.4 10*3/uL — ABNORMAL HIGH (ref 1.7–7.7)
Neutrophils Relative %: 70 %
Platelets: 187 10*3/uL (ref 150–400)
RBC: 3.19 MIL/uL — AB (ref 4.22–5.81)
RDW: 17.5 % — ABNORMAL HIGH (ref 11.5–15.5)
WBC: 12 10*3/uL — AB (ref 4.0–10.5)

## 2016-07-12 LAB — BLOOD GAS, ARTERIAL
Acid-Base Excess: 1.2 mmol/L (ref 0.0–2.0)
Acid-Base Excess: 2.3 mmol/L — ABNORMAL HIGH (ref 0.0–2.0)
BICARBONATE: 25.3 meq/L — AB (ref 20.0–24.0)
Bicarbonate: 26.7 mEq/L — ABNORMAL HIGH (ref 20.0–24.0)
DRAWN BY: 23430
Drawn by: 234301
FIO2: 40
MECHVT: 550 mL
O2 CONTENT: 4 L/min
O2 SAT: 97.3 %
O2 Saturation: 98.7 %
PATIENT TEMPERATURE: 37
PEEP: 5 cmH2O
PO2 ART: 102 mmHg — AB (ref 80.0–100.0)
Patient temperature: 37
RATE: 14 resp/min
pCO2 arterial: 46.9 mmHg — ABNORMAL HIGH (ref 35.0–45.0)
pCO2 arterial: 35.8 mmHg (ref 35.0–45.0)
pH, Arterial: 7.362 (ref 7.350–7.450)
pH, Arterial: 7.47 — ABNORMAL HIGH (ref 7.350–7.450)
pO2, Arterial: 117 mmHg — ABNORMAL HIGH (ref 80.0–100.0)

## 2016-07-12 LAB — TYPE AND SCREEN
ABO/RH(D): B POS
ANTIBODY SCREEN: NEGATIVE

## 2016-07-12 LAB — COMPREHENSIVE METABOLIC PANEL
ALK PHOS: 47 U/L (ref 38–126)
ALT: 10 U/L — AB (ref 17–63)
AST: 12 U/L — AB (ref 15–41)
Albumin: 2.4 g/dL — ABNORMAL LOW (ref 3.5–5.0)
Anion gap: 8 (ref 5–15)
BILIRUBIN TOTAL: 0.3 mg/dL (ref 0.3–1.2)
BUN: 27 mg/dL — AB (ref 6–20)
CALCIUM: 7.8 mg/dL — AB (ref 8.9–10.3)
CO2: 30 mmol/L (ref 22–32)
CREATININE: 1.06 mg/dL (ref 0.61–1.24)
Chloride: 110 mmol/L (ref 101–111)
Glucose, Bld: 83 mg/dL (ref 65–99)
Potassium: 3.6 mmol/L (ref 3.5–5.1)
Sodium: 148 mmol/L — ABNORMAL HIGH (ref 135–145)
Total Protein: 5.3 g/dL — ABNORMAL LOW (ref 6.5–8.1)

## 2016-07-12 LAB — RAPID URINE DRUG SCREEN, HOSP PERFORMED
Amphetamines: NOT DETECTED
Barbiturates: NOT DETECTED
Benzodiazepines: POSITIVE — AB
COCAINE: NOT DETECTED
OPIATES: NOT DETECTED
TETRAHYDROCANNABINOL: NOT DETECTED

## 2016-07-12 LAB — LACTIC ACID, PLASMA: LACTIC ACID, VENOUS: 2.8 mmol/L — AB (ref 0.5–1.9)

## 2016-07-12 LAB — CBG MONITORING, ED
GLUCOSE-CAPILLARY: 51 mg/dL — AB (ref 65–99)
GLUCOSE-CAPILLARY: 150 mg/dL — AB (ref 65–99)
GLUCOSE-CAPILLARY: 149 mg/dL — AB (ref 65–99)
Glucose-Capillary: 69 mg/dL (ref 65–99)
Glucose-Capillary: 155 mg/dL — ABNORMAL HIGH (ref 65–99)
Glucose-Capillary: 151 mg/dL — ABNORMAL HIGH (ref 65–99)

## 2016-07-12 LAB — URINALYSIS, ROUTINE W REFLEX MICROSCOPIC
BILIRUBIN URINE: NEGATIVE
Glucose, UA: NEGATIVE mg/dL
NITRITE: NEGATIVE
PROTEIN: 30 mg/dL — AB
SPECIFIC GRAVITY, URINE: 1.02 (ref 1.005–1.030)
pH: 5.5 (ref 5.0–8.0)

## 2016-07-12 LAB — URINE MICROSCOPIC-ADD ON

## 2016-07-12 LAB — VALPROIC ACID LEVEL: Valproic Acid Lvl: 93 ug/mL (ref 50.0–100.0)

## 2016-07-12 LAB — ETHANOL: Alcohol, Ethyl (B): <5 mg/dL (ref <5)

## 2016-07-12 LAB — CORTISOL: Cortisol, Plasma: 15.4 ug/dL

## 2016-07-12 MED ORDER — SUCCINYLCHOLINE CHLORIDE 20 MG/ML IJ SOLN
100.0000 mg | Freq: Once | INTRAMUSCULAR | Status: AC
  Administered 2016-07-12: 100 mg via INTRAVENOUS

## 2016-07-12 MED ORDER — FENTANYL CITRATE (PF) 100 MCG/2ML IJ SOLN: 100.0000 ug | INTRAMUSCULAR | Status: DC | PRN

## 2016-07-12 MED ORDER — DEXTROSE 5 % IV SOLN
0.0000 ug/min | INTRAVENOUS | Status: DC
  Administered 2016-07-12: 10 ug/min via INTRAVENOUS
  Administered 2016-07-13: 5 ug/min via INTRAVENOUS
  Administered 2016-07-13: 4 ug/min via INTRAVENOUS
  Administered 2016-07-16: 2 ug/min via INTRAVENOUS
  Administered 2016-07-18: 3 ug/min via INTRAVENOUS
  Filled 2016-07-12 (×5): qty 4

## 2016-07-12 MED ORDER — ASPIRIN 325 MG PO TABS
325.0000 mg | ORAL_TABLET | Freq: Every day | ORAL | Status: DC
Start: 2016-07-13 — End: 2016-08-10
  Administered 2016-07-13 – 2016-08-10 (×28): 325 mg
  Filled 2016-07-12 (×28): qty 1

## 2016-07-12 MED ORDER — HYDROCORTISONE NA SUCCINATE PF 100 MG IJ SOLR
100.0000 mg | Freq: Once | INTRAMUSCULAR | Status: AC
  Administered 2016-07-12: 100 mg via INTRAVENOUS
  Filled 2016-07-12: qty 2

## 2016-07-12 MED ORDER — PROPOFOL 1000 MG/100ML IV EMUL
0.0000 ug/kg/min | INTRAVENOUS | Status: DC
  Administered 2016-07-12: 40 ug/kg/min via INTRAVENOUS
  Administered 2016-07-13: 30 ug/kg/min via INTRAVENOUS
  Filled 2016-07-12: qty 100

## 2016-07-12 MED ORDER — HEPARIN SOD (PORK) LOCK FLUSH 100 UNIT/ML IV SOLN
INTRAVENOUS | Status: AC
  Filled 2016-07-12: qty 15

## 2016-07-12 MED ORDER — SODIUM CHLORIDE 0.9 % IV BOLUS (SEPSIS)
1000.0000 mL | Freq: Once | INTRAVENOUS | Status: AC
  Administered 2016-07-12: 1000 mL via INTRAVENOUS

## 2016-07-12 MED ORDER — SODIUM CHLORIDE 0.9 % IV SOLN
1500.0000 mg | Freq: Two times a day (BID) | INTRAVENOUS | Status: DC
  Administered 2016-07-12 – 2016-07-15 (×7): 1500 mg via INTRAVENOUS
  Filled 2016-07-12 (×9): qty 15

## 2016-07-12 MED ORDER — FAMOTIDINE IN NACL 20-0.9 MG/50ML-% IV SOLN
20.0000 mg | Freq: Two times a day (BID) | INTRAVENOUS | Status: DC
  Administered 2016-07-12: 20 mg via INTRAVENOUS
  Filled 2016-07-12: qty 50

## 2016-07-12 MED ORDER — LORAZEPAM 2 MG/ML IJ SOLN
1.0000 mg | Freq: Once | INTRAMUSCULAR | Status: AC
  Administered 2016-07-12: 1 mg via INTRAVENOUS
  Filled 2016-07-12: qty 1

## 2016-07-12 MED ORDER — DEXTROSE 50 % IV SOLN
INTRAVENOUS | Status: AC
  Filled 2016-07-12: qty 50

## 2016-07-12 MED ORDER — ETOMIDATE 2 MG/ML IV SOLN
20.0000 mg | Freq: Once | INTRAVENOUS | Status: AC
  Administered 2016-07-12: 20 mg via INTRAVENOUS

## 2016-07-12 MED ORDER — HYDROCORTISONE 20 MG PO TABS
100.0000 mg | ORAL_TABLET | Freq: Three times a day (TID) | ORAL | Status: DC
  Administered 2016-07-12: 100 mg
  Filled 2016-07-12: qty 5

## 2016-07-12 MED ORDER — FOLIC ACID 5 MG/ML IJ SOLN
1.0000 mg | Freq: Every day | INTRAMUSCULAR | Status: DC
  Filled 2016-07-12: qty 0.2

## 2016-07-12 MED ORDER — PROPOFOL 1000 MG/100ML IV EMUL
5.0000 ug/kg/min | INTRAVENOUS | Status: DC
  Administered 2016-07-12: 10 ug/kg/min via INTRAVENOUS
  Filled 2016-07-12: qty 100

## 2016-07-12 MED ORDER — VALPROATE SODIUM 500 MG/5ML IV SOLN
750.0000 mg | Freq: Three times a day (TID) | INTRAVENOUS | Status: DC
  Administered 2016-07-12 – 2016-07-16 (×11): 750 mg via INTRAVENOUS
  Filled 2016-07-12 (×14): qty 7.5

## 2016-07-12 MED ORDER — SODIUM CHLORIDE 0.9 % IV SOLN: 250.0000 mL | INTRAVENOUS | Status: DC | PRN

## 2016-07-12 MED ORDER — HEPARIN SODIUM (PORCINE) 5000 UNIT/ML IJ SOLN
5000.0000 [IU] | Freq: Three times a day (TID) | INTRAMUSCULAR | Status: DC
  Administered 2016-07-13 – 2016-07-29 (×48): 5000 [IU] via SUBCUTANEOUS
  Filled 2016-07-12 (×48): qty 1

## 2016-07-12 MED ORDER — ATORVASTATIN CALCIUM 20 MG PO TABS
20.0000 mg | ORAL_TABLET | Freq: Every day | ORAL | Status: DC
  Administered 2016-07-13 – 2016-08-09 (×28): 20 mg
  Filled 2016-07-12 (×29): qty 1

## 2016-07-12 MED ORDER — FLUDROCORTISONE ACETATE 0.1 MG PO TABS
0.1000 mg | ORAL_TABLET | Freq: Every day | ORAL | Status: DC
  Administered 2016-07-13 – 2016-08-10 (×28): 0.1 mg
  Filled 2016-07-12 (×29): qty 1

## 2016-07-12 MED ORDER — FENTANYL CITRATE (PF) 100 MCG/2ML IJ SOLN
100.0000 ug | INTRAMUSCULAR | Status: DC | PRN
  Administered 2016-07-21 – 2016-07-28 (×9): 100 ug via INTRAVENOUS
  Administered 2016-07-29: 25 ug via INTRAVENOUS
  Administered 2016-07-30 – 2016-07-31 (×5): 100 ug via INTRAVENOUS
  Filled 2016-07-12 (×14): qty 2

## 2016-07-12 MED ORDER — SODIUM CHLORIDE 0.9 % IV SOLN
INTRAVENOUS | Status: DC
  Administered 2016-07-12: 15:00:00 via INTRAVENOUS
  Administered 2016-07-13: 1000 mL via INTRAVENOUS

## 2016-07-12 MED ORDER — DEXTROSE 50 % IV SOLN
1.0000 | Freq: Once | INTRAVENOUS | Status: AC
  Administered 2016-07-12: 50 mL via INTRAVENOUS

## 2016-07-12 MED ORDER — ALBUTEROL SULFATE (2.5 MG/3ML) 0.083% IN NEBU: 2.5000 mg | INHALATION_SOLUTION | RESPIRATORY_TRACT | Status: DC | PRN

## 2016-07-12 MED ORDER — INSULIN ASPART 100 UNIT/ML ~~LOC~~ SOLN
0.0000 [IU] | SUBCUTANEOUS | Status: DC
  Administered 2016-07-13: 4 [IU] via SUBCUTANEOUS
  Administered 2016-07-13 – 2016-07-30 (×5): 3 [IU] via SUBCUTANEOUS
  Administered 2016-07-31 – 2016-08-02 (×6): 0 [IU] via SUBCUTANEOUS
  Administered 2016-08-03: 3 [IU] via SUBCUTANEOUS

## 2016-07-12 MED ORDER — PROPOFOL 1000 MG/100ML IV EMUL
INTRAVENOUS | Status: AC
  Filled 2016-07-12: qty 100

## 2016-07-12 MED ORDER — SODIUM CHLORIDE 0.9 % IV SOLN: INTRAVENOUS | Status: DC

## 2016-07-12 MED ORDER — THIAMINE HCL 100 MG/ML IJ SOLN: 100.0000 mg | Freq: Every day | INTRAMUSCULAR | Status: DC

## 2016-07-12 NOTE — ED Notes (Signed)
CBG 155 

## 2016-07-12 NOTE — ED Notes (Signed)
MD, RT and nurse at bedside. Preparing to intubate patient.

## 2016-07-12 NOTE — ED Notes (Signed)
Call[facility] to [facility] at request of Dr. Particia NearingHaviland.

## 2016-07-12 NOTE — ED Notes (Signed)
OG placement confirmed by 2 RNs.

## 2016-07-12 NOTE — H&P (Signed)
PULMONARY / CRITICAL CARE MEDICINE   Name: Dale [name] MRN: [id number] DOB: 01/24/1966    ADMISSION DATE:  07/12/2016 CONSULTATION DATE:  07/12/16  REFERRING MD:  [facility]  CHIEF COMPLAINT:  AMS  HISTORY OF PRESENT ILLNESS:  Pt is encephelopathic; therefore, this HPI is obtained from chart review. Dale [name] is 50 y.o. M with PMH as outlined below including admission in March 2017 with strep pneumo meningitis c/b cardiac arrest and cardiogenic shock, ARDS requiring prolonged intubation and trach (now decannulated), and acute renal failure.  He was recently [facility]16 through 07/11/16 for recurrent hypoglycemia due to adrenal insufficiency, new subacute CVA, seizures with inability to protect airway s/p intubation 7/13 through 7/21.  Has been followed by endocrinology due to inability to wean off IV hydrocortisone, currently on long term hydrocortisone + florinef.  It was recommended that pt be admitted to tertiary center for further hospitalizations.  He was just discharged on 8/7; however, on 8/8, he presented again to [facility] ED due to unresponsiveness and "not acting right" while at nursing home.  While in ED, he was unable to protect airway, therefore required intubation again.  He was later transferred to [facility] for further evaluation and management.  Of note, during last admission, it was strongly recommended that pt be listed as DNR / DNI; however, wife declined.  PAST MEDICAL HISTORY :  He  has a past medical history of Acute encephalopathy; Acute ischemic stroke (HCC); Acute renal failure (HCC); Acute respiratory failure with hypoxia (HCC); AKI (acute kidney injury) (HCC) (02/16/2016); Altered mental status; Cardiac arrest (HCC); Cardiogenic shock (HCC); Cerebral septic emboli (HCC); Cerebral thrombosis with cerebral infarction (03/25/2016); CHF (congestive heart failure) (HCC); Convulsions/seizures (HCC) (05/04/2016); Dementia (04/27/2016); Depression; Dysphagia (05/04/2016); ETOH abuse;  Hydrocephalus; Hyperlipidemia (05/04/2016); Hypernatremia; Hypothermia (03/20/2016); Low serum cortisol level (HCC) (05/04/2016); Meningitis, pneumococcal, recurrent; Meningitis, streptococcal (02/16/2016); Meningoencephalitis; NSTEMI (non-ST elevated myocardial infarction) (HCC) (03/14/2016); Protein-calorie malnutrition, severe (HCC); Respiratory failure (HCC) (03/03/2016); Septic shock (HCC); Severe sepsis (HCC); Streptococcal meningitis (04/27/2016); Substance abuse; Systolic and diastolic CHF, chronic (HCC); Tobacco abuse (02/16/2016); Trichomonal urethritis in male (02/16/2016); and Urinary retention (05/04/2016).  PAST SURGICAL HISTORY: He  has a past surgical history that includes Ankle surgery; Cardiac catheterization (N/A, 02/17/2016); and TEE without cardioversion (N/A, 03/29/2016).  No Known Allergies  No current facility-administered medications on file prior to encounter.    Current Outpatient Prescriptions on File Prior to Encounter  Medication Sig  . albuterol (PROVENTIL) (2.5 MG/3ML) 0.083% nebulizer solution Take 3 mLs (2.5 mg total) by nebulization every 3 (three) hours as needed for wheezing. (Patient taking differently: Take 2.5 mg by nebulization every 3 (three) hours as needed for wheezing or shortness of breath. )  . aspirin 325 MG tablet Take 1 tablet (325 mg total) by mouth daily.  Marland Kitchen. atorvastatin (LIPITOR) 20 MG tablet Take 1 tablet (20 mg total) by mouth daily at 6 PM.  . bethanechol (URECHOLINE) 10 MG tablet Take 1 tablet (10 mg total) by mouth every 8 (eight) hours.  . divalproex (DEPAKOTE SPRINKLE) 125 MG capsule Take 6 capsules (750 mg total) by mouth every 8 (eight) hours.  . famotidine (PEPCID) 20 MG tablet Take 1 tablet (20 mg total) by mouth daily.  . feeding supplement, ENSURE ENLIVE, (ENSURE ENLIVE) LIQD Take 237 mLs by mouth every 6 (six) hours.  . fludrocortisone (FLORINEF) 0.1 MG tablet Take 1 tablet (0.1 mg total) by mouth daily.  . folic acid (FOLVITE) 1 MG tablet  Take 1 tablet (1  mg total) by mouth daily.  . hydrocortisone (CORTEF) 20 MG tablet Take 5 tablets (100 mg total) by mouth 3 (three) times daily with meals.  . levETIRAcetam (KEPPRA) 100 MG/ML solution Take 15 mLs (1,500 mg total) by mouth 2 (two) times daily.  Marland Kitchen LORazepam (ATIVAN) 0.5 MG tablet Take 1 tablet (0.5 mg total) by mouth every 8 (eight) hours as needed for anxiety.  . potassium chloride SA (K-DUR,KLOR-CON) 20 MEQ tablet Take 2 tablets (40 mEq total) by mouth daily.  . tamsulosin (FLOMAX) 0.4 MG CAPS capsule Take 1 capsule (0.4 mg total) by mouth daily.  Marland Kitchen thiamine 100 MG tablet Take 1 tablet (100 mg total) by mouth daily.  . Maltodextrin-Xanthan Gum (RESOURCE THICKENUP CLEAR) POWD As needed.  . Maltodextrin-Xanthan Gum (RESOURCE THICKENUP CLEAR) POWD Take 120 g by mouth as needed.    FAMILY HISTORY:  His has no family status information on file.    SOCIAL HISTORY: He  reports that he has been smoking Cigarettes.  He has been smoking about 0.10 packs per day. He has never used smokeless tobacco. He reports that he uses drugs, including Marijuana. He reports that he does not drink alcohol.  REVIEW OF SYSTEMS:  Unable to obtain as pt is encephalopathic.  SUBJECTIVE: On vent, unresponsive.  VITAL SIGNS: BP 124/87   Pulse (!) 52   Temp (!) 90.7 F (32.6 C)   Resp 14   Ht 5\' 8"  (1.727 m)   Wt 141 lb 15.6 oz (64.4 kg)   SpO2 100%   BMI 21.59 kg/m   HEMODYNAMICS:    VENTILATOR SETTINGS: Vent Mode: PRVC FiO2 (%):  [40 %] 40 % Set Rate:  [14 bmp] 14 bmp Vt Set:  [550 mL] 550 mL PEEP:  [5 cmH20] 5 cmH20 Plateau Pressure:  [13 cmH20] 13 cmH20  INTAKE / OUTPUT: I/O last 3 completed shifts: In: -  Out: 1100 [Urine:1100]   PHYSICAL EXAMINATION: General: Middle aged male, in NAD. Neuro: Sedated, non-responsive. HEENT: [place]/AT. PERRL, sclerae anicteric. Cardiovascular: RRR, no M/R/G.  Lungs: Respirations even and unlabored.  CTA bilaterally, No W/R/R. Abdomen: BS x 4,  soft, NT/ND.  Musculoskeletal: No gross deformities, no edema.  Skin: Intact, warm, no rashes.  LABS:  BMET  Recent Labs Lab 07/10/16 0409 07/11/16 0400 07/12/16 1345  NA 148* 148* 148*  K 4.5 3.6 3.6  CL 104 107 110  CO2 36* 35* 30  BUN 13 13 27*  CREATININE 0.84 0.74 1.06  GLUCOSE 95 87 83    Electrolytes  Recent Labs Lab 07/09/16 1618 07/10/16 0409 07/11/16 0400 07/12/16 1345  CALCIUM  --  8.8* 8.5* 7.8*  MG 2.0  --   --   --     CBC  Recent Labs Lab 07/09/16 0451 07/11/16 0400 07/12/16 1345  WBC 8.6 6.4 12.0*  HGB 8.6* 9.0* 9.5*  HCT 28.0* 28.8* 29.3*  PLT 201 209 187    Coag's No results for input(s): APTT, INR in the last 168 hours.  Sepsis Markers  Recent Labs Lab 07/12/16 1345  LATICACIDVEN 2.8*    ABG  Recent Labs Lab 07/12/16 1440 07/12/16 1850  PHART 7.362 7.470*  PCO2ART 46.9* 35.8  PO2ART 102* 117*    Liver Enzymes  Recent Labs Lab 07/09/16 0451 07/11/16 0400 07/12/16 1345  AST 14* 10* 12*  ALT 11* 11* 10*  ALKPHOS 48 45 47  BILITOT 0.4 0.3 0.3  ALBUMIN 2.3* 2.3* 2.4*    Cardiac Enzymes No results for input(s): TROPONINI,  PROBNP in the last 168 hours.  Glucose  Recent Labs Lab 07/12/16 1339 07/12/16 1440 07/12/16 1553 07/12/16 1705 07/12/16 1834 07/12/16 1952  GLUCAP 69 51* 155* 150* 149* 151*    Imaging Ct Head Wo Contrast  Result Date: 07/12/2016 CLINICAL DATA:  Unresponsive, mental status change, history dementia, meningitis, CHF, stroke EXAM: CT HEAD WITHOUT CONTRAST TECHNIQUE: Contiguous axial images were obtained from the base of the skull through the vertex without intravenous contrast. COMPARISON:  07/06/2016 FINDINGS: Generalized atrophy. Normal ventricular morphology. No midline shift or mass effect. Old BILATERAL basal ganglia lacunar infarcts. Subacute inferior LEFT caudate infarct as seen on prior study. Minimal small vessel chronic ischemic changes of deep cerebral white matter. No  intracranial hemorrhage, mass lesion or evidence of cortical infarction. No extra-axial fluid collections. Air-fluid level LEFT maxillary sinus. Remaining paranasal sinuses and mastoid air cells clear. Bones unremarkable. IMPRESSION: Atrophy with minimal small vessel chronic ischemic changes of deep cerebral white matter. Old BILATERAL basal ganglia lacunar infarcts with a subacute infarct again identified at the inferior LEFT caudate. No new intracranial abnormalities. Electronically Signed   By: Ulyses Southward M.D.   On: 07/12/2016 17:16   Dg Chest Portable 1 View  Result Date: 07/12/2016 CLINICAL DATA:  Status post intubation. EXAM: PORTABLE CHEST 1 VIEW COMPARISON:  07/12/2016 at 1420 hours FINDINGS: Endotracheal tube tip projects 19 mm above the carina, well positioned. New nasal/ orogastric tube extends well below the diaphragm into the stomach and below the included field of view. Right internal jugular central venous line tip now projects in the lower superior vena cava above the right atrium and caval atrial junction. Lungs are clear.  No pleural effusion or pneumothorax. IMPRESSION: 1. Well-positioned endotracheal tube, nasal/orogastric tube and right internal jugular central venous line. 2. No pneumothorax.  No acute findings in the lungs. Electronically Signed   By: Amie Portland M.D.   On: 07/12/2016 18:32   Dg Chest Portable 1 View  Result Date: 07/12/2016 CLINICAL DATA:  Central line placement.  Unresponsive. EXAM: PORTABLE CHEST 1 VIEW COMPARISON:  07/05/2016 FINDINGS: Indistinct upper zone vasculature probably at least partially due to supine positioning. Heart size within normal limits. Right IJ line tip just over 3 vertebral body lengths below the carina, compatible with the right atrium. If cavoatrial junction positioning is desired, consider retraction by 3.5 cm. Low lung volumes are present, causing crowding of the pulmonary vasculature. No airspace opacity identified. IMPRESSION: 1. Right  IJ line tip:  Right atrium.  No pneumothorax. 2. Low lung volumes are present, causing crowding of the pulmonary vasculature. Electronically Signed   By: Gaylyn Rong M.D.   On: 07/12/2016 14:37     STUDIES:  CT head 8/8 > old b/l infarcts with subacute infarct again identified at inferior left caudate.  No acute process.  CULTURES: Blood 8/8 > Sputum 8/8 > Urine 8/8 >  ANTIBIOTICS: Vanc 8/8 > Zosyn 8/8 >  SIGNIFICANT EVENTS: 8/8 > admitted with unresponsiveness, required intubation.  LINES/TUBES: ETT 8/8 > R IJ CVL 8/8 >  DISCUSSION: 50 y.o. M with complex medical history and multiple recent admissions, resides in nursing home, admitted again 8/8 with unresponsiveness and inability to protect airway, intubated in ED.  Hypotensive and hypothermic but has this chronically due to adrenal insufficiency.  No evidence of sepsis.  ASSESSMENT / PLAN:  PULMONARY A: Acute hypoxic respiratory failure - due to inability to protect airway. 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 P:   Full vent support.  ABG and wean as able. Daily SBT. Albuterol PRN. Daily CXR.  CARDIOVASCULAR A:  Shock - unclear etiology.  Suspect due to adrenal insufficiency vs decompensated heart failure (less likely).  Doubt any component of sepsis though is at risk given his multiple admissions. Hx cardiac arrest, sCHF (echo from 07/05/16 with EF 40-45%), HLD. P:  Continue levophed for goal MAP > 65. Stress steroids. Continue outpatient ASA, atorvastatin.  RENAL A:   No acute issues. P:   Correct electrolytes as indicated. BMP in AM.  GASTROINTESTINAL A:   VTE prophylaxis. Nutrition. P:   SUP: famotidine. NPO for now.  HEMATOLOGIC A:   Anemia - chronic. VTE prophylaxis. P:  Transfuse for Hgb < 7. SCD's / Heparin. CBC in AM.  INFECTIOUS A:   Hypothermia + hypotension - chronic due to adrenal insufficiency.  No indication of infection. P:   Monitor clinically. Follow  cultures. PCT not helpful given chronic steroids.  ENDOCRINE A:   Adrenal insufficiency - has had trouble weaning off IV hydrocortisone. Hyperglycemia - at risk for exacerbation with steroids. P:   Continue outpatient hydrocortisone, florinef. SSI.  NEUROLOGIC A:   Acute encephalopathy. Hx seizures, strep pneumo meningitis, CVA. P:   Sedation:  fentanyl gtt / midazolam PRN. RASS Goal: 0 to -1. Daily WUA. Continue outpatient Depakote, keppra, thiamine, folic acid.  Family updated: None.  Interdisciplinary Family Meeting v Palliative Care Meeting:  Due by: 8/14.  CC time: 40 minutes.   Rutherford Guys, [place] [facility] Pager: [phone]  or [phone] 07/12/2016, 10:33 PM

## 2016-07-12 NOTE — ED Provider Notes (Signed)
[facility]-EMERGENCY DEPT Provider Note   CSN: [id number] Arrival date & time: 07/12/16  1337  First Provider Contact:  None       History   Chief Complaint Chief Complaint  Patient presents with  . Altered Mental Status    HPI Dale [name] is a 50 y.o. male.  Pt presents today from the nursing home with "not acting right."  Pt has a very complicated past medical history starting with strep pneumo meningitis in march.  He had a cardiac arrest, ards, then adrenal insufficiency.  The pt has been readmitted multiple times with various infections.  He was most recently admitted on 7/13 and d/c'd yesterday.  While in the hospital, he had signs of adrenal insufficiency from suspected hypothalamic dysregulation with hypotension and hypoglycemia and hypothermia.  He has encephalopathy that is ongoing.  He has seizures and has been on depakote and keppra.  He was intubated during this hospital stay.  His prognosis is extremely poor and the doctors that admitted him recommended DNR/DNI to the family.  The pt's wife declined this.  The admission doctors recommended transfer to a tertiary care facility if he needs readmission as they feel there is nothing else they can do at [facility].  There is no family with him now.  Pt is unable to give any history.   The history is provided by medical records.  Altered Mental Status      Past Medical History:  Diagnosis Date  . Acute encephalopathy   . Acute ischemic stroke (HCC)   . Acute renal failure (HCC)   . Acute respiratory failure with hypoxia (HCC)   . AKI (acute kidney injury) (HCC) 02/16/2016  . Altered mental status   . Cardiac arrest (HCC)   . Cardiogenic shock (HCC)   . Cerebral septic emboli (HCC)   . Cerebral thrombosis with cerebral infarction 03/25/2016  . CHF (congestive heart failure) (HCC)   . Convulsions/seizures (HCC) 05/04/2016   Seizure disorder - Continue Keppra, Depakote.     . Dementia 04/27/2016  . Depression   . Dysphagia  05/04/2016   Dysphagia - Dysphagia 1 diet per SLP     . ETOH abuse   . Hydrocephalus   . Hyperlipidemia 05/04/2016   Dyslipidemia - Continue statin.     Marland Kitchen. Hypernatremia   . Hypothermia 03/20/2016  . Low serum cortisol level (HCC) 05/04/2016   Low a.m. cortisol  - Was 1.2 checked on 04/03/2016  - This likely is reflective of cortisol suppression from steroid use - Tapered steroids off   . Meningitis, pneumococcal, recurrent   . Meningitis, streptococcal 02/16/2016  . Meningoencephalitis   . NSTEMI (non-ST elevated myocardial infarction) (HCC) 03/14/2016  . Protein-calorie malnutrition, severe (HCC)   . Respiratory failure (HCC) 03/03/2016  . Septic shock (HCC)   . Severe sepsis (HCC)   . 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 Substance abuse   . Systolic and diastolic CHF, chronic (HCC)   . Tobacco abuse 02/16/2016  . 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 Urinary retention 05/04/2016   Urinary retention - Continue Flomax       Patient Active Problem List   Diagnosis Date Noted  . Encephalopathy 07/12/2016  . Adrenal insufficiency (Addison's disease) (HCC)   . Bradycardia   . Hypoglycemia 07/02/2016  . Pressure ulcer 06/26/2016  . Acute respiratory failure with hypercapnia (HCC)   . Encounter for central line placement   . Acute respiratory failure with hypoxemia (HCC)   . Hypothermia 05/19/2016  .  Seizure (HCC)   . Weak 05/06/2016  . Convulsions/seizures (HCC) 05/04/2016  . Urinary retention 05/04/2016  . Hyperlipidemia 05/04/2016  . Dysphagia 05/04/2016  . Low serum cortisol level (HCC) 05/04/2016  . Dementia 04/27/2016  . 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 Moderate protein-calorie malnutrition (HCC)   . Palliative care encounter   . Cerebral septic emboli (HCC)   . Sepsis (HCC)   . Cerebral thrombosis with cerebral infarction 03/25/2016  . Meningoencephalitis   . Bacterial meningitis   . Acute ischemic stroke (HCC)   . Severe sepsis (HCC)   . Meningitis,  pneumococcal, recurrent   . Systolic and diastolic CHF, chronic (HCC)   . Acute renal failure (HCC)   . Hypernatremia   . NSTEMI (non-ST elevated myocardial infarction) (HCC) 03/14/2016  . Physical deconditioning 03/14/2016  . Tracheostomy status (HCC)   . Encounter for palliative care   . Goals of care, counseling/discussion   . Tracheostomy care (HCC)   . Respiratory failure (HCC) 03/03/2016  . Acute respiratory failure with hypoxia (HCC)   . Septic shock (HCC)   . Cardiogenic shock (HCC)   . Cardiac arrest (HCC)   . Hydrocephalus   . Altered mental status   . CHF (congestive heart failure) (HCC)   . 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 Homeless 02/16/2016  . Meningitis, streptococcal 02/16/2016  . Tobacco abuse 02/16/2016  . Gram positive sepsis (HCC) 02/16/2016  . Underweight 02/16/2016  . ETOH abuse   . Substance abuse   . Acute encephalopathy   . Acute respiratory failure [facility])     Past Surgical History:  Procedure Laterality Date  . ANKLE SURGERY    . CARDIAC CATHETERIZATION N/A 02/17/2016   Procedure: IABP Insertion;  Surgeon: Iran Ouch, MD;  Location: [facility] INVASIVE CV LAB;  Service: Cardiovascular;  Laterality: N/A;  . TEE WITHOUT CARDIOVERSION N/A 03/29/2016   Procedure: TRANSESOPHAGEAL ECHOCARDIOGRAM (TEE);  Surgeon: Laurey Morale, MD;  Location: [facility] ENDOSCOPY;  Service: Cardiovascular;  Laterality: N/A;       Home Medications    Prior to Admission medications   Medication Sig Start Date End Date Taking? Authorizing Provider  albuterol (PROVENTIL) (2.5 MG/3ML) 0.083% nebulizer solution Take 3 mLs (2.5 mg total) by nebulization every 3 (three) hours as needed for wheezing. Patient taking differently: Take 2.5 mg by nebulization every 3 (three) hours as needed for wheezing or shortness of breath.  03/10/16  Yes Richarda Overlie, MD  aspirin 325 MG tablet Take 1 tablet (325 mg total) by mouth daily. 06/01/16  Yes Renae Fickle, MD  atorvastatin  (LIPITOR) 20 MG tablet Take 1 tablet (20 mg total) by mouth daily at 6 PM. 04/04/16  Yes Alison Murray, MD  bethanechol (URECHOLINE) 10 MG tablet Take 1 tablet (10 mg total) by mouth every 8 (eight) hours. 06/29/16  Yes Richarda Overlie, MD  divalproex (DEPAKOTE SPRINKLE) 125 MG capsule Take 6 capsules (750 mg total) by mouth every 8 (eight) hours. 07/11/16  Yes Richarda Overlie, MD  famotidine (PEPCID) 20 MG tablet Take 1 tablet (20 mg total) by mouth daily. 06/01/16  Yes Renae Fickle, MD  feeding supplement, ENSURE ENLIVE, (ENSURE ENLIVE) LIQD Take 237 mLs by mouth every 6 (six) hours. 05/31/16  Yes Kathlen Mody, MD  fludrocortisone (FLORINEF) 0.1 MG tablet Take 1 tablet (0.1 mg total) by mouth daily. 07/11/16  Yes Richarda Overlie, MD  folic acid (FOLVITE) 1 MG tablet Take 1 tablet (1 mg total) by mouth daily. 06/01/16  Yes Thea Silversmith  Short, MD  hydrocortisone (CORTEF) 20 MG tablet Take 5 tablets (100 mg total) by mouth 3 (three) times daily with meals. 07/11/16  Yes Richarda Overlie, MD  levETIRAcetam (KEPPRA) 100 MG/ML solution Take 15 mLs (1,500 mg total) by mouth 2 (two) times daily. 06/29/16  Yes Richarda Overlie, MD  LORazepam (ATIVAN) 0.5 MG tablet Take 1 tablet (0.5 mg total) by mouth every 8 (eight) hours as needed for anxiety. 06/29/16  Yes Richarda Overlie, MD  potassium chloride SA (K-DUR,KLOR-CON) 20 MEQ tablet Take 2 tablets (40 mEq total) by mouth daily. 07/11/16  Yes Richarda Overlie, MD  tamsulosin (FLOMAX) 0.4 MG CAPS capsule Take 1 capsule (0.4 mg total) by mouth daily. 04/04/16  Yes Alison Murray, MD  thiamine 100 MG tablet Take 1 tablet (100 mg total) by mouth daily. 06/01/16  Yes Renae Fickle, MD  Maltodextrin-Xanthan Gum (RESOURCE THICKENUP CLEAR) POWD As needed. 05/31/16   Kathlen Mody, MD  Maltodextrin-Xanthan Gum (RESOURCE THICKENUP CLEAR) POWD Take 120 g by mouth as needed. 06/29/16   Richarda Overlie, MD    Family History No family history on file.  Social History Social History  Substance Use Topics  .  Smoking status: Current Every Day Smoker    Packs/day: 0.10    Types: Cigarettes  . Smokeless tobacco: Never Used  . Alcohol use No     Allergies   Review of patient's allergies indicates no known allergies.   Review of Systems Review of Systems  Unable to perform ROS: Patient nonverbal     Physical Exam Updated Vital Signs BP 127/92   Pulse (!) 27   Temp (!) 91.6 F (33.1 C)   Resp 14   Ht 5\' 8"  (1.727 m)   Wt 141 lb 15.6 oz (64.4 kg)   SpO2 99%   BMI 21.59 kg/m   Physical Exam  HENT:  Head: Normocephalic and atraumatic.  Right Ear: External ear normal.  Left Ear: External ear normal.  Nose: Nose normal.  Mouth/Throat: Oropharynx is clear and moist.  Eyes: Conjunctivae and lids are normal. Pupils are equal, round, and reactive to light.  Pt unable to follow commands, but his gaze is to the left.  Neck: Normal range of motion. Neck supple.  Cardiovascular: An irregular rhythm present. Tachycardia present.   Pulmonary/Chest: Effort normal and breath sounds normal.  Abdominal: Soft. Bowel sounds are normal.  Musculoskeletal: Normal range of motion.  Neurological:  Pt is awake, eyes deviated to the left.  He is moving all 4 extremities.  He is not following commands.  Skin: Skin is warm and dry.  Nursing note and vitals reviewed.    ED Treatments / Results  Labs (all labs ordered are listed, but only abnormal results are displayed) Labs Reviewed  CBC WITH DIFFERENTIAL/PLATELET - Abnormal; Notable for the following:       Result Value   WBC 12.0 (*)    RBC 3.19 (*)    Hemoglobin 9.5 (*)    HCT 29.3 (*)    RDW 17.5 (*)    Neutro Abs 8.4 (*)    Monocytes Absolute 1.8 (*)    All other components within normal limits  COMPREHENSIVE METABOLIC PANEL - Abnormal; Notable for the following:    Sodium 148 (*)    BUN 27 (*)    Calcium 7.8 (*)    Total Protein 5.3 (*)    Albumin 2.4 (*)    AST 12 (*)    ALT 10 (*)    All other  components within normal limits    URINE RAPID DRUG SCREEN, HOSP PERFORMED - Abnormal; Notable for the following:    Benzodiazepines POSITIVE (*)    All other components within normal limits  LACTIC ACID, PLASMA - Abnormal; Notable for the following:    Lactic Acid, Venous 2.8 (*)    All other components within normal limits  URINALYSIS, ROUTINE W REFLEX MICROSCOPIC (NOT AT [facility]) - Abnormal; Notable for the following:    Hgb urine dipstick TRACE (*)    Ketones, ur TRACE (*)    Protein, ur 30 (*)    Leukocytes, UA MODERATE (*)    All other components within normal limits  BLOOD GAS, ARTERIAL - Abnormal; Notable for the following:    pCO2 arterial 46.9 (*)    pO2, Arterial 102 (*)    Bicarbonate 25.3 (*)    All other components within normal limits  URINE MICROSCOPIC-ADD ON - Abnormal; Notable for the following:    Squamous Epithelial / LPF 0-5 (*)    Bacteria, UA MANY (*)    All other components within normal limits  BLOOD GAS, ARTERIAL - Abnormal; Notable for the following:    pH, Arterial 7.470 (*)    pO2, Arterial 117 (*)    Bicarbonate 26.7 (*)    Acid-Base Excess 2.3 (*)    All other components within normal limits  CBG MONITORING, ED - Abnormal; Notable for the following:    Glucose-Capillary 51 (*)    All other components within normal limits  CBG MONITORING, ED - Abnormal; Notable for the following:    Glucose-Capillary 155 (*)    All other components within normal limits  CBG MONITORING, ED - Abnormal; Notable for the following:    Glucose-Capillary 150 (*)    All other components within normal limits  CBG MONITORING, ED - Abnormal; Notable for the following:    Glucose-Capillary 149 (*)    All other components within normal limits  CULTURE, BLOOD (ROUTINE X 2)  CULTURE, BLOOD (ROUTINE X 2)  ETHANOL  VALPROIC ACID LEVEL  CORTISOL  CBG MONITORING, ED  CBG MONITORING, ED  CBG MONITORING, ED  TYPE AND SCREEN    EKG  EKG Interpretation  Date/Time:  Tuesday July 12 2016 16:17:10  EDT Ventricular Rate:  103 PR Interval:    QRS Duration: 98 QT Interval:  337 QTC Calculation: 442 R Axis:   78 Text Interpretation:  Atrial fibrillation Ventricular premature complex Borderline T abnormalities, diffuse leads Confirmed by Katsumi Wisler MD, Shenaya Lebo ([id number]) on 07/12/2016 4:19:03 PM       Radiology Ct Head Wo Contrast  Result Date: 07/12/2016 CLINICAL DATA:  Unresponsive, mental status change, history dementia, meningitis, CHF, stroke EXAM: CT HEAD WITHOUT CONTRAST TECHNIQUE: Contiguous axial images were obtained from the base of the skull through the vertex without intravenous contrast. COMPARISON:  07/06/2016 FINDINGS: Generalized atrophy. Normal ventricular morphology. No midline shift or mass effect. Old BILATERAL basal ganglia lacunar infarcts. Subacute inferior LEFT caudate infarct as seen on prior study. Minimal small vessel chronic ischemic changes of deep cerebral white matter. No intracranial hemorrhage, mass lesion or evidence of cortical infarction. No extra-axial fluid collections. Air-fluid level LEFT maxillary sinus. Remaining paranasal sinuses and mastoid air cells clear. Bones unremarkable. IMPRESSION: Atrophy with minimal small vessel chronic ischemic changes of deep cerebral white matter. Old BILATERAL basal ganglia lacunar infarcts with a subacute infarct again identified at the inferior LEFT caudate. No new intracranial abnormalities. Electronically Signed   By: Angelyn Punt.D.  On: 07/12/2016 17:16   Dg Chest Portable 1 View  Result Date: 07/12/2016 CLINICAL DATA:  Status post intubation. EXAM: PORTABLE CHEST 1 VIEW COMPARISON:  07/12/2016 at 1420 hours FINDINGS: Endotracheal tube tip projects 19 mm above the carina, well positioned. New nasal/ orogastric tube extends well below the diaphragm into the stomach and below the included field of view. Right internal jugular central venous line tip now projects in the lower superior vena cava above the right atrium and caval  atrial junction. Lungs are clear.  No pleural effusion or pneumothorax. IMPRESSION: 1. Well-positioned endotracheal tube, nasal/orogastric tube and right internal jugular central venous line. 2. No pneumothorax.  No acute findings in the lungs. Electronically Signed   By: Amie Portland M.D.   On: 07/12/2016 18:32   Dg Chest Portable 1 View  Result Date: 07/12/2016 CLINICAL DATA:  Central line placement.  Unresponsive. EXAM: PORTABLE CHEST 1 VIEW COMPARISON:  07/05/2016 FINDINGS: Indistinct upper zone vasculature probably at least partially due to supine positioning. Heart size within normal limits. Right IJ line tip just over 3 vertebral body lengths below the carina, compatible with the right atrium. If cavoatrial junction positioning is desired, consider retraction by 3.5 cm. Low lung volumes are present, causing crowding of the pulmonary vasculature. No airspace opacity identified. IMPRESSION: 1. Right IJ line tip:  Right atrium.  No pneumothorax. 2. Low lung volumes are present, causing crowding of the pulmonary vasculature. Electronically Signed   By: Gaylyn Rong M.D.   On: 07/12/2016 14:37    Procedures .Central Line Date/Time: 07/12/2016 3:03 PM Performed by: Jacalyn Lefevre Authorized by: Jacalyn Lefevre   Consent:    Consent obtained:  Emergent situation Universal protocol:    Procedure explained and questions answered to patient or proxy's satisfaction: yes     Relevant documents present and verified: yes     Test results available and properly labeled: yes     Imaging studies available: yes     Required blood products, implants, devices, and special equipment available: yes     Site/side marked: yes     Immediately prior to procedure, a time out was called: yes     Patient identity confirmed:  Arm band Pre-procedure details:    Hand hygiene: Hand hygiene performed prior to insertion     Sterile barrier technique: All elements of maximal sterile technique followed     Skin  preparation:  2% chlorhexidine   Skin preparation agent: Skin preparation agent completely dried prior to procedure   Anesthesia (see MAR for exact dosages):    Anesthesia method:  Local infiltration   Local anesthetic:  Lidocaine 1% w/o epi Procedure details:    Location:  R internal jugular   Patient position:  Flat   Catheter size:  7 Fr   Landmarks identified: yes     Ultrasound guidance: yes     Sterile ultrasound techniques: Sterile gel and sterile probe covers were used     Number of attempts:  1   Successful placement: yes   Post-procedure details:    Post-procedure:  Dressing applied and line sutured   Assessment:  Blood return through all ports, no pneumothorax on x-ray, placement verified by x-ray and free fluid flow   Patient tolerance of procedure:  Tolerated well, no immediate complications Comments:     Initial CXR showed the line to be in the right atrium, so the line was pulled back and re sutured. [place] bedside Date/Time: 07/12/2016 3:05 PM Performed by: Jacalyn Lefevre  Authorized by: Jacalyn Lefevre  Consent: The procedure was performed in an emergent situation. Required items: required blood products, implants, devices, and special equipment available Patient identity confirmed: arm band Time out: Immediately prior to procedure a "time out" was called to verify the correct patient, procedure, equipment, support staff and site/side marked as required. Preparation: Patient was prepped and draped in the usual sterile fashion. Local anesthesia used: yes Anesthesia: local infiltration  Anesthesia: Local anesthesia used: yes Local Anesthetic: lidocaine 1% without epinephrine Anesthetic total: 2 mL  Sedation: Patient sedated: no Comments: [place] was performed to visualize the right IJ.  The central line placement was performed under ultrasound guidance.  .Intubation Date/Time: 07/12/2016 6:18 PM Performed by: Jacalyn Lefevre Authorized by: Jacalyn Lefevre   Consent:     Consent obtained:  Emergent situation   Consent given by:  Spouse   Alternatives discussed:  No treatment Pre-procedure details:    Patient status:  Altered mental status   Pretreatment medications:  None   Paralytics:  Succinylcholine Procedure details:    Preoxygenation:  Bag valve mask   CPR in progress: no     Intubation method:  Oral   Oral intubation technique:  Video-assisted   Laryngoscope blade:  Mac 3   Tube size (mm):  7.0   Tube type:  Cuffed   Number of attempts:  1 Placement assessment:    ETT to lip:  23   Tube secured with:  ETT holder   Breath sounds:  Equal   Placement verification: ETCO2 detector     CXR findings:  ETT in proper place .Critical Care Performed by: Jacalyn Lefevre Authorized by: Jacalyn Lefevre   Critical care provider statement:    Critical care time (minutes):  90   Critical care time was exclusive of:  Separately billable procedures and treating other patients and teaching time   Critical care was necessary to treat or prevent imminent or life-threatening deterioration of the following conditions:  Circulatory failure, CNS failure or compromise, endocrine crisis, metabolic crisis and respiratory failure   Critical care was time spent personally by me on the following activities:  Ordering and performing treatments and interventions, ordering and review of laboratory studies, ordering and review of radiographic studies, pulse oximetry, re-evaluation of patient's condition, review of old charts, ventilator management, examination of patient, evaluation of patient's response to treatment, discussions with consultants and development of treatment plan with patient or surrogate   (including critical care time)  Medications Ordered in ED Medications  sodium chloride 0.9 % bolus 1,000 mL (0 mLs Intravenous Stopped 07/12/16 1445)    And  0.9 %  sodium chloride infusion ( Intravenous New Bag/Given 07/12/16 1445)  norepinephrine (LEVOPHED) 4 mg in  dextrose 5 % 250 mL (0.016 mg/mL) infusion (5 mcg/min Intravenous Rate/Dose Change 07/12/16 1556)  heparin lock flush 100 UNIT/ML injection (not administered)  propofol (DIPRIVAN) 1000 MG/100ML infusion (40 mcg/kg/min  64.4 kg Intravenous Rate/Dose Change 07/12/16 1855)  propofol (DIPRIVAN) 1000 MG/100ML infusion (not administered)  sodium chloride 0.9 % bolus 1,000 mL (0 mLs Intravenous Stopped 07/12/16 1440)  dextrose 50 % solution 50 mL (50 mLs Intravenous Not Given 07/12/16 1445)  hydrocortisone sodium succinate (SOLU-CORTEF) 100 MG injection 100 mg (100 mg Intravenous Given 07/12/16 1444)  sodium chloride 0.9 % bolus 1,000 mL (0 mLs Intravenous Stopped 07/12/16 1817)  etomidate (AMIDATE) injection 20 mg (20 mg Intravenous Given 07/12/16 1749)  succinylcholine (ANECTINE) injection 100 mg (100 mg Intravenous Given 07/12/16 1751)  LORazepam (ATIVAN) injection  1 mg (1 mg Intravenous Given by Other 07/12/16 1817)     Initial Impression / Assessment and Plan / ED Course  I have reviewed the triage vital signs and the nursing notes.  Pertinent labs & imaging results that were available during my care of the patient were reviewed by me and considered in my medical decision making (see chart for details).  Clinical Course   I spoke with pt's wife about patient's prognosis.  She still wants everything done.  She said that she is waiting for her son to get out of jail and for pt's brothers to get here from out of town.  The wife is adamant that she still wants intubation and pressor support.  She said she can't make this decision alone.  Pt also does not drive and is on chemo daily.  She does not have a way to get here and does not want him to die without her.  Pt's respiratory rate has continued to drop and the nurse has had to stimulate pt many times to get pt to breathe.  Due to this, the pt was intubated.  He tolerated this without difficulty.   Pt was discharged yesterday from [facility].  The discharge said  that pt needed to be transferred to a tertiary care center for further admissions.  I spoke with the ICU attending at [facility] and they did not feel this was an appropriate transfer.  They refused the transfer and said that [facility] should be able to handle patient.  Pt d/w Dr. Arsenio Loader ([facility] critical care at [facility]) who will accept transfer.  Accepting physician will be Dr. Kendrick Fries.   Final Clinical Impressions(s) / ED Diagnoses   Final diagnoses:  Encephalopathy  Adrenal insufficiency (Addison's disease) (HCC)  Hypothermia, initial encounter  Hypotension, unspecified hypotension type  Hypoglycemia  New onset a-fib (HCC)  Acute respiratory failure, unspecified whether with hypoxia or hypercapnia (HCC)    New Prescriptions New Prescriptions   No medications on file     Jacalyn Lefevre, MD 07/13/16 1257

## 2016-07-12 NOTE — ED Triage Notes (Signed)
[facility] Per EMS pt was alert and at baseline this morning and unknown time pt became unresponsive. Pt is alert now. Last known well is unknown.   MD at bedside   CBG 85

## 2016-07-12 NOTE — ED Notes (Signed)
Levophed titrated for effect. Pt's BP unstable, difficulty with titration. Dr. Particia NearingHaviland aware.

## 2016-07-12 NOTE — ED Notes (Signed)
CRITICAL VALUE ALERT  Critical value received:  Lactic Acid 2.8  Date of notification:  07/12/16  Time of notification:  1426  Critical value read back:Yes.    Nurse who received alert:  Viviano SimasLauren Robinson, RN  MD notified (1st page):  [facility]

## 2016-07-12 NOTE — ED Notes (Signed)
Pt transferred via [facility].eport given to receiving RN.

## 2016-07-12 NOTE — ED Notes (Signed)
Started pt on warm fluids and bair hugger due to hypothermia.

## 2016-07-12 NOTE — ED Notes (Signed)
Pt back from CT scan. Bair Hugger in place. Levophed drip at 18.8 ml/hr (5 mcg/min). NS bolus running on fluid warmer.

## 2016-07-12 NOTE — ED Notes (Signed)
OG placed per Dr. Ceasar LundHaviland's order. Unable to pass NG due to nasal edema.

## 2016-07-12 NOTE — ED Notes (Signed)
Positive color change

## 2016-07-13 ENCOUNTER — Inpatient Hospital Stay (HOSPITAL_COMMUNITY): Payer: Medicaid Other

## 2016-07-13 DIAGNOSIS — N390 Urinary tract infection, site not specified: Secondary | ICD-10-CM | POA: Insufficient documentation

## 2016-07-13 DIAGNOSIS — J9601 Acute respiratory failure with hypoxia: Secondary | ICD-10-CM

## 2016-07-13 DIAGNOSIS — I4891 Unspecified atrial fibrillation: Secondary | ICD-10-CM | POA: Diagnosis present

## 2016-07-13 DIAGNOSIS — T68XXXA Hypothermia, initial encounter: Secondary | ICD-10-CM

## 2016-07-13 DIAGNOSIS — E162 Hypoglycemia, unspecified: Secondary | ICD-10-CM

## 2016-07-13 DIAGNOSIS — I959 Hypotension, unspecified: Secondary | ICD-10-CM

## 2016-07-13 DIAGNOSIS — J9600 Acute respiratory failure, unspecified whether with hypoxia or hypercapnia: Secondary | ICD-10-CM

## 2016-07-13 DIAGNOSIS — G9340 Encephalopathy, unspecified: Secondary | ICD-10-CM

## 2016-07-13 DIAGNOSIS — E271 Primary adrenocortical insufficiency: Secondary | ICD-10-CM

## 2016-07-13 LAB — PHOSPHORUS
PHOSPHORUS: 3.4 mg/dL (ref 2.5–4.6)
PHOSPHORUS: 3.5 mg/dL (ref 2.5–4.6)
Phosphorus: 3.3 mg/dL (ref 2.5–4.6)

## 2016-07-13 LAB — POCT I-STAT 3, ART BLOOD GAS (G3+)
Acid-Base Excess: 4 mmol/L — ABNORMAL HIGH (ref 0.0–2.0)
Bicarbonate: 27.8 mEq/L — ABNORMAL HIGH (ref 20.0–24.0)
O2 Saturation: 100 %
PCO2 ART: 34 mmHg — AB (ref 35.0–45.0)
TCO2: 29 mmol/L (ref 0–100)
pH, Arterial: 7.511 — ABNORMAL HIGH (ref 7.350–7.450)
pO2, Arterial: 155 mmHg — ABNORMAL HIGH (ref 80.0–100.0)

## 2016-07-13 LAB — CBC
HEMATOCRIT: 27.6 % — AB (ref 39.0–52.0)
Hemoglobin: 8.6 g/dL — ABNORMAL LOW (ref 13.0–17.0)
MCH: 28.8 pg (ref 26.0–34.0)
MCHC: 31.2 g/dL (ref 30.0–36.0)
MCV: 92.3 fL (ref 78.0–100.0)
PLATELETS: 209 10*3/uL (ref 150–400)
RBC: 2.99 MIL/uL — ABNORMAL LOW (ref 4.22–5.81)
RDW: 18.1 % — AB (ref 11.5–15.5)
WBC: 10.6 10*3/uL — ABNORMAL HIGH (ref 4.0–10.5)

## 2016-07-13 LAB — GLUCOSE, CAPILLARY
GLUCOSE-CAPILLARY: 101 mg/dL — AB (ref 65–99)
GLUCOSE-CAPILLARY: 104 mg/dL — AB (ref 65–99)
GLUCOSE-CAPILLARY: 109 mg/dL — AB (ref 65–99)
GLUCOSE-CAPILLARY: 113 mg/dL — AB (ref 65–99)
GLUCOSE-CAPILLARY: 113 mg/dL — AB (ref 65–99)
GLUCOSE-CAPILLARY: 123 mg/dL — AB (ref 65–99)
GLUCOSE-CAPILLARY: 137 mg/dL — AB (ref 65–99)
GLUCOSE-CAPILLARY: 137 mg/dL — AB (ref 65–99)
GLUCOSE-CAPILLARY: 150 mg/dL — AB (ref 65–99)
GLUCOSE-CAPILLARY: 154 mg/dL — AB (ref 65–99)
GLUCOSE-CAPILLARY: 73 mg/dL (ref 65–99)
GLUCOSE-CAPILLARY: 84 mg/dL (ref 65–99)
GLUCOSE-CAPILLARY: 92 mg/dL (ref 65–99)
Glucose-Capillary: 156 mg/dL — ABNORMAL HIGH (ref 65–99)
Glucose-Capillary: 85 mg/dL (ref 65–99)
Glucose-Capillary: 133 mg/dL — ABNORMAL HIGH (ref 65–99)
Glucose-Capillary: 59 mg/dL — ABNORMAL LOW (ref 65–99)
Glucose-Capillary: 106 mg/dL — ABNORMAL HIGH (ref 65–99)
Glucose-Capillary: 84 mg/dL (ref 65–99)
Glucose-Capillary: 132 mg/dL — ABNORMAL HIGH (ref 65–99)
Glucose-Capillary: 78 mg/dL (ref 65–99)

## 2016-07-13 LAB — BLOOD GAS, ARTERIAL
Acid-Base Excess: 3.2 mmol/L — ABNORMAL HIGH (ref 0.0–2.0)
Bicarbonate: 27.3 mEq/L — ABNORMAL HIGH (ref 20.0–24.0)
Drawn by: 402031
FIO2: 0.4
MECHVT: 550 mL
O2 Saturation: 99 %
PATIENT TEMPERATURE: 98.6
PCO2 ART: 42.2 mmHg (ref 35.0–45.0)
PEEP: 5 cmH2O
PO2 ART: 161 mmHg — AB (ref 80.0–100.0)
RATE: 14 resp/min
TCO2: 28.6 mmol/L (ref 0–100)
pH, Arterial: 7.427 (ref 7.350–7.450)

## 2016-07-13 LAB — TYPE AND SCREEN
ABO/RH(D): B POS
Antibody Screen: NEGATIVE

## 2016-07-13 LAB — BASIC METABOLIC PANEL
ANION GAP: 3 — AB (ref 5–15)
Anion gap: 7 (ref 5–15)
BUN: 12 mg/dL (ref 6–20)
BUN: 12 mg/dL (ref 6–20)
CALCIUM: 7.5 mg/dL — AB (ref 8.9–10.3)
CO2: 29 mmol/L (ref 22–32)
CO2: 26 mmol/L (ref 22–32)
CREATININE: 0.76 mg/dL (ref 0.61–1.24)
Calcium: 7.1 mg/dL — ABNORMAL LOW (ref 8.9–10.3)
Chloride: 117 mmol/L — ABNORMAL HIGH (ref 101–111)
Chloride: 119 mmol/L — ABNORMAL HIGH (ref 101–111)
Creatinine, Ser: 0.95 mg/dL (ref 0.61–1.24)
GFR calc Af Amer: >60 mL/min (ref >60)
GFR calc non Af Amer: >60 mL/min (ref >60)
GLUCOSE: 84 mg/dL (ref 65–99)
GLUCOSE: 125 mg/dL — AB (ref 65–99)
POTASSIUM: 3.3 mmol/L — AB (ref 3.5–5.1)
Potassium: 3.4 mmol/L — ABNORMAL LOW (ref 3.5–5.1)
Sodium: 153 mmol/L — ABNORMAL HIGH (ref 135–145)
Sodium: 148 mmol/L — ABNORMAL HIGH (ref 135–145)

## 2016-07-13 LAB — MAGNESIUM
MAGNESIUM: 1.7 mg/dL (ref 1.7–2.4)
MAGNESIUM: 1.7 mg/dL (ref 1.7–2.4)
Magnesium: 2.2 mg/dL (ref 1.7–2.4)

## 2016-07-13 LAB — LACTIC ACID, PLASMA: Lactic Acid, Venous: 1.6 mmol/L (ref 0.5–1.9)

## 2016-07-13 LAB — PROCALCITONIN: Procalcitonin: 0.1 ng/mL

## 2016-07-13 LAB — MRSA PCR SCREENING: MRSA by PCR: NEGATIVE

## 2016-07-13 LAB — TSH: TSH: 0.524 u[IU]/mL (ref 0.350–4.500)

## 2016-07-13 LAB — TRIGLYCERIDES: TRIGLYCERIDES: 214 mg/dL — AB (ref <150)

## 2016-07-13 MED ORDER — POTASSIUM CHLORIDE 10 MEQ/50ML IV SOLN
10.0000 meq | INTRAVENOUS | Status: AC
Start: 2016-07-13 — End: 2016-07-14
  Administered 2016-07-13 – 2016-07-14 (×4): 10 meq via INTRAVENOUS
  Filled 2016-07-13 (×4): qty 50

## 2016-07-13 MED ORDER — DEXTROSE-NACL 5-0.9 % IV SOLN: INTRAVENOUS | Status: DC

## 2016-07-13 MED ORDER — DEXTROSE-NACL 5-0.45 % IV SOLN
INTRAVENOUS | Status: DC
  Administered 2016-07-13 – 2016-07-14 (×3): via INTRAVENOUS

## 2016-07-13 MED ORDER — FAMOTIDINE 40 MG/5ML PO SUSR
20.0000 mg | Freq: Two times a day (BID) | ORAL | Status: DC
  Administered 2016-07-13 – 2016-08-10 (×56): 20 mg
  Filled 2016-07-13 (×58): qty 2.5

## 2016-07-13 MED ORDER — DEXTROSE 50 % IV SOLN: 25.0000 mL | Freq: Once | INTRAVENOUS | Status: AC

## 2016-07-13 MED ORDER — SODIUM CHLORIDE 0.9 % IV SOLN
25.0000 ug/h | INTRAVENOUS | Status: DC
  Administered 2016-07-13 – 2016-07-15 (×2): 50 ug/h via INTRAVENOUS
  Administered 2016-07-17: 75 ug/h via INTRAVENOUS
  Administered 2016-07-18: 50 ug/h via INTRAVENOUS
  Administered 2016-07-19: 100 ug/h via INTRAVENOUS
  Administered 2016-07-20: 125 ug/h via INTRAVENOUS
  Administered 2016-07-21: 150 ug/h via INTRAVENOUS
  Administered 2016-07-23: 130 ug/h via INTRAVENOUS
  Filled 2016-07-13 (×10): qty 50

## 2016-07-13 MED ORDER — SENNOSIDES 8.8 MG/5ML PO SYRP
5.0000 mL | ORAL_SOLUTION | Freq: Two times a day (BID) | ORAL | Status: DC | PRN
  Filled 2016-07-13: qty 5

## 2016-07-13 MED ORDER — PIPERACILLIN-TAZOBACTAM 3.375 G IVPB 30 MIN
3.3750 g | INTRAVENOUS | Status: AC
  Administered 2016-07-12: 3.375 g via INTRAVENOUS
  Filled 2016-07-13: qty 50

## 2016-07-13 MED ORDER — VITAMIN B-1 100 MG PO TABS
100.0000 mg | ORAL_TABLET | Freq: Every day | ORAL | Status: DC
  Administered 2016-07-13 – 2016-08-10 (×28): 100 mg
  Filled 2016-07-13 (×28): qty 1

## 2016-07-13 MED ORDER — LORAZEPAM 2 MG/ML IJ SOLN
4.0000 mg | Freq: Once | INTRAMUSCULAR | Status: AC
  Administered 2016-07-13: 4 mg via INTRAVENOUS
  Filled 2016-07-13: qty 2

## 2016-07-13 MED ORDER — FOLIC ACID 1 MG PO TABS
1.0000 mg | ORAL_TABLET | Freq: Every day | ORAL | Status: DC
  Administered 2016-07-13 – 2016-08-10 (×28): 1 mg
  Filled 2016-07-13 (×29): qty 1

## 2016-07-13 MED ORDER — ANTISEPTIC ORAL RINSE SOLUTION (CORINZ)
7.0000 mL | OROMUCOSAL | Status: DC
  Administered 2016-07-13 – 2016-07-29 (×159): 7 mL via OROMUCOSAL

## 2016-07-13 MED ORDER — VANCOMYCIN HCL 500 MG IV SOLR
500.0000 mg | Freq: Three times a day (TID) | INTRAVENOUS | Status: DC
  Administered 2016-07-13 – 2016-07-14 (×4): 500 mg via INTRAVENOUS
  Filled 2016-07-13 (×5): qty 500

## 2016-07-13 MED ORDER — MIDAZOLAM HCL 2 MG/2ML IJ SOLN
2.0000 mg | INTRAMUSCULAR | Status: DC | PRN
  Administered 2016-07-14 – 2016-08-01 (×48): 2 mg via INTRAVENOUS
  Filled 2016-07-13 (×46): qty 2

## 2016-07-13 MED ORDER — VITAL HIGH PROTEIN PO LIQD: 1000.0000 mL | ORAL | Status: DC

## 2016-07-13 MED ORDER — SODIUM CHLORIDE 0.9 % IV BOLUS (SEPSIS)
1000.0000 mL | Freq: Once | INTRAVENOUS | Status: AC
  Administered 2016-07-13: 1000 mL via INTRAVENOUS

## 2016-07-13 MED ORDER — MIDAZOLAM HCL 2 MG/2ML IJ SOLN
2.0000 mg | INTRAMUSCULAR | Status: DC | PRN
  Administered 2016-07-14: 1 mg via INTRAVENOUS
  Filled 2016-07-13: qty 2

## 2016-07-13 MED ORDER — FENTANYL BOLUS VIA INFUSION
50.0000 ug | INTRAVENOUS | Status: DC | PRN
  Administered 2016-07-19 – 2016-07-22 (×8): 50 ug via INTRAVENOUS
  Filled 2016-07-13: qty 50

## 2016-07-13 MED ORDER — VITAL AF 1.2 CAL PO LIQD
1000.0000 mL | ORAL | Status: DC
  Administered 2016-07-13 – 2016-07-19 (×8): 1000 mL

## 2016-07-13 MED ORDER — CHLORHEXIDINE GLUCONATE 0.12% ORAL RINSE (MEDLINE KIT)
15.0000 mL | Freq: Two times a day (BID) | OROMUCOSAL | Status: DC
  Administered 2016-07-13 – 2016-07-29 (×32): 15 mL via OROMUCOSAL

## 2016-07-13 MED ORDER — DEXTROSE 50 % IV SOLN
INTRAVENOUS | Status: AC
  Filled 2016-07-13: qty 50

## 2016-07-13 MED ORDER — DEXTROSE 50 % IV SOLN
25.0000 mL | Freq: Once | INTRAVENOUS | Status: AC
  Administered 2016-07-13: 25 mL via INTRAVENOUS

## 2016-07-13 MED ORDER — PRO-STAT SUGAR FREE PO LIQD
30.0000 mL | Freq: Two times a day (BID) | ORAL | Status: DC
  Administered 2016-07-13: 30 mL
  Filled 2016-07-13: qty 30

## 2016-07-13 MED ORDER — MAGNESIUM SULFATE 2 GM/50ML IV SOLN
2.0000 g | Freq: Once | INTRAVENOUS | Status: AC
  Administered 2016-07-13: 2 g via INTRAVENOUS
  Filled 2016-07-13: qty 50

## 2016-07-13 MED ORDER — DEXTROSE 50 % IV SOLN
INTRAVENOUS | Status: AC
  Administered 2016-07-13: 25 mL
  Filled 2016-07-13: qty 50

## 2016-07-13 MED ORDER — FENTANYL CITRATE (PF) 100 MCG/2ML IJ SOLN
50.0000 ug | Freq: Once | INTRAMUSCULAR | Status: AC
  Administered 2016-07-13: 50 ug via INTRAVENOUS
  Filled 2016-07-13: qty 2

## 2016-07-13 MED ORDER — HYDROCORTISONE NA SUCCINATE PF 100 MG IJ SOLR
50.0000 mg | Freq: Four times a day (QID) | INTRAMUSCULAR | Status: DC
  Administered 2016-07-13 – 2016-07-20 (×29): 50 mg via INTRAVENOUS
  Filled 2016-07-13 (×29): qty 2

## 2016-07-13 MED ORDER — BISACODYL 10 MG RE SUPP: 10.0000 mg | Freq: Every day | RECTAL | Status: DC | PRN

## 2016-07-13 MED ORDER — PIPERACILLIN-TAZOBACTAM 3.375 G IVPB
3.3750 g | Freq: Three times a day (TID) | INTRAVENOUS | Status: DC
  Administered 2016-07-13 – 2016-07-15 (×6): 3.375 g via INTRAVENOUS
  Filled 2016-07-13 (×8): qty 50

## 2016-07-13 NOTE — Progress Notes (Signed)
Pharmacy Antibiotic Note  Dale [name] is a 50 y.o. male admitted from SNF on 07/12/2016 with UTI sepsis requiring norepinephrine.  Pharmacy has been consulted for Zosyn/vancomcyin dosing. Noted pt just d/c from hospital 8/7 after 3 wk long hospitalization and recent strep pneumo meningitis.  Hypothermic on admission 90.6 improved 98 now, WBC 12>11 Cr 0.8.  Plan: Zosyn 3.375gm IV q8h - EI Vancomycin 500mg  q8 with VT goal 15-20 Will f/u micro data, renal function, and pt's clinical condition  Height: 5\' 8"  (172.7 cm) Weight: 123 lb 14.4 oz (56.2 kg) IBW/kg (Calculated) : 68.4  Temp (24hrs), Avg:92.6 F (33.7 C), Min:90.7 F (32.6 C), Max:98.6 F (37 C)   Recent Labs Lab 07/07/16 0326 07/09/16 0451 07/10/16 0409 07/11/16 0400 07/12/16 1345 07/13/16 0448  WBC 5.9 8.6  --  6.4 12.0* 10.6*  CREATININE 0.76 0.76 0.84 0.74 1.06 0.76  LATICACIDVEN  --   --   --   --  2.8*  --     Estimated Creatinine Clearance: 86.8 mL/min (by C-G formula based on SCr of 0.8 mg/dL).    No Known Allergies  Antimicrobials this admission: 8/9 Zosyn>> 8/9 vanc> Microbiology results: 8/9 BCx x2: 8/8 BCx x2:  8/9 UCx:    Sputum:   8/8 MRSA PCR:   Dale [name] Pharm.D. CPP, BCPS Clinical Pharmacist [phone] 07/13/2016 9:41 AM

## 2016-07-13 NOTE — Progress Notes (Signed)
CRITICAL VALUE ALERT  Critical value received: CBG 53, repeat  133     15 min  after 1/2 amp Dextrose 50 Date of notification: 07/13/2016  Time of notification:  0300  Critical value read back: yes  Nurse who received alert: Kevin FentonPaula Jaren Vanetten, RN  MD notified (1st page): DR. Colletta Marylandamaswami  Time of first page:  [phone]  MD notified (2nd page):  Time of second page:  Responding MD:  DR Colletta Marylandamaswami  Time MD responded:  [phone]

## 2016-07-13 NOTE — Progress Notes (Signed)
EEG completed, results pending. 

## 2016-07-13 NOTE — Progress Notes (Signed)
eLink Physician-Brief Progress Note Patient Name: Dale [name] DOB: 01/03/1966 MRN: [id number]   Date of Service  07/13/2016  HPI/Events of Note  K+ = 3.3 and Creatinine = 0.95.  eICU Interventions  Will replace K+.     Intervention Category Intermediate Interventions: Electrolyte abnormality - evaluation and management  Sommer,Steven Eugene 07/13/2016, 9:34 PM

## 2016-07-13 NOTE — Consult Note (Signed)
PULMONARY / CRITICAL CARE MEDICINE   Name: SAHIL MILNER MRN: [id number] DOB: 01-Dec-1966    ADMISSION DATE:  07/12/2016 CONSULTATION DATE:  07/12/16  REFERRING MD:  [facility]  CHIEF COMPLAINT:  AMS  HISTORY OF PRESENT ILLNESS:  Pt is encephelopathic; therefore, this HPI is obtained from chart review. Benyamin Jeff is 50 y.o. M with PMH as outlined below including admission in March 2017 with strep pneumo meningitis c/b cardiac arrest and cardiogenic shock, ARDS requiring prolonged intubation and trach (now decannulated), and acute renal failure.  He was recently admitted to [facility] 06/16/16 through 07/11/16 for recurrent hypoglycemia due to adrenal insufficiency, new subacute CVA, seizures with inability to protect airway s/p intubation 7/13 through 7/21.  Has been followed by endocrinology due to inability to wean off IV hydrocortisone, currently on long term hydrocortisone + florinef.  It was recommended that pt be admitted to tertiary center for further hospitalizations.  He was just discharged on 8/7; however, on 8/8, he presented again to [facility] ED due to unresponsiveness and "not acting right" while at nursing home.  While in ED, he was unable to protect airway, therefore required intubation again.  He was later transferred to [facility] for further evaluation and management.  Of note, during last admission, it was strongly recommended that pt be listed as DNR / DNI; however, wife declined.  SUBJECTIVE:  Levo at 4, vent, possible seizure now per RN  VITAL SIGNS: BP 130/89   Pulse 76   Temp 98.6 F (37 C)   Resp 14   Ht 5\' 8"  (1.727 m)   Wt 56.2 kg (123 lb 14.4 oz)   SpO2 100%   BMI 18.84 kg/m   HEMODYNAMICS: CVP:  [0 mmHg-3 mmHg] 2 mmHg  VENTILATOR SETTINGS: Vent Mode: PRVC FiO2 (%):  [40 %] 40 % Set Rate:  [14 bmp] 14 bmp Vt Set:  [550 mL] 550 mL PEEP:  [5 cmH20] 5 cmH20 Plateau Pressure:  [13 cmH20-18 cmH20] 18 cmH20  INTAKE / OUTPUT: I/O last 3 completed shifts: In: 1869.8 [I.V.:1579.8;  NG/GT:90; IV Piggyback:200] Out: 4825 [Urine:4825]   PHYSICAL EXAMINATION: General: Middle aged male, in NAD. Neuro: Sedated, non-responsive, perr 3 HEENT: [place]/AT. PERRL, sclerae anicteric., no nuchal rigidity Cardiovascular: RRR, no M/R/G.  Lungs: CTA Abdomen: BS x 4, soft, NT/ND.  Musculoskeletal: No gross deformities, no edema.  Skin: Intact, warm, no rashes.  LABS:  BMET  Recent Labs Lab 07/11/16 0400 07/12/16 1345 07/13/16 0448  NA 148* 148* 153*  K 3.6 3.6 3.4*  CL 107 110 117*  CO2 35* 30 29  BUN 13 27* 12  CREATININE 0.74 1.06 0.76  GLUCOSE 87 83 84    Electrolytes  Recent Labs Lab 07/09/16 1618  07/11/16 0400 07/12/16 1345 07/13/16 0448  CALCIUM  --   < > 8.5* 7.8* 7.5*  MG 2.0  --   --   --  1.7  PHOS  --   --   --   --  3.4  < > = values in this interval not displayed.  CBC  Recent Labs Lab 07/11/16 0400 07/12/16 1345 07/13/16 0448  WBC 6.4 12.0* 10.6*  HGB 9.0* 9.5* 8.6*  HCT 28.8* 29.3* 27.6*  PLT 209 187 209    Coag's No results for input(s): APTT, INR in the last 168 hours.  Sepsis Markers  Recent Labs Lab 07/12/16 1345  LATICACIDVEN 2.8*    ABG  Recent Labs Lab 07/12/16 1440 07/12/16 1850 07/13/16 0440  PHART 7.362 7.470* 7.427  PCO2ART 46.9* 35.8 42.2  PO2ART 102* 117* 161*    Liver Enzymes  Recent Labs Lab 07/09/16 0451 07/11/16 0400 07/12/16 1345  AST 14* 10* 12*  ALT 11* 11* 10*  ALKPHOS 48 45 47  BILITOT 0.4 0.3 0.3  ALBUMIN 2.3* 2.3* 2.4*    Cardiac Enzymes No results for input(s): TROPONINI, PROBNP in the last 168 hours.  Glucose  Recent Labs Lab 07/13/16 0400 07/13/16 0451 07/13/16 0555 07/13/16 0609 07/13/16 0651 07/13/16 0802  GLUCAP 106* 84 73 137* 109* 84    Imaging Ct Head Wo Contrast  Result Date: 07/12/2016 CLINICAL DATA:  Unresponsive, mental status change, history dementia, meningitis, CHF, stroke EXAM: CT HEAD WITHOUT CONTRAST TECHNIQUE: Contiguous axial images were  obtained from the base of the skull through the vertex without intravenous contrast. COMPARISON:  07/06/2016 FINDINGS: Generalized atrophy. Normal ventricular morphology. No midline shift or mass effect. Old BILATERAL basal ganglia lacunar infarcts. Subacute inferior LEFT caudate infarct as seen on prior study. Minimal small vessel chronic ischemic changes of deep cerebral white matter. No intracranial hemorrhage, mass lesion or evidence of cortical infarction. No extra-axial fluid collections. Air-fluid level LEFT maxillary sinus. Remaining paranasal sinuses and mastoid air cells clear. Bones unremarkable. IMPRESSION: Atrophy with minimal small vessel chronic ischemic changes of deep cerebral white matter. Old BILATERAL basal ganglia lacunar infarcts with a subacute infarct again identified at the inferior LEFT caudate. No new intracranial abnormalities. Electronically Signed   By: Ulyses Southward M.D.   On: 07/12/2016 17:16   Dg Chest Port 1 View  Result Date: 07/13/2016 CLINICAL DATA:  Respiratory failure. EXAM: PORTABLE CHEST 1 VIEW COMPARISON:  07/12/2016. FINDINGS: Endotracheal tube in stable position. NG tube tip noted in the upper stomach. Side hole gastroesophageal junction. Advancement of the NG tube by approximately 8 cm suggested. Heart size stable. Progressive left lower lobe atelectasis and/or infiltrate. Small left pleural effusion. No pneumothorax. IMPRESSION: 1. Endotracheal tube in stable position. NG tube tip noted in the upper stomach. Side hole at the gastroesophageal junction. Advancement of the NG tube by approximately 8 cm suggested. 2. Progressive left lower lobe atelectasis and or infiltrate. Small left pleural effusion. Electronically Signed   By: Maisie Fus  Register   On: 07/13/2016 06:42   Dg Chest Portable 1 View  Result Date: 07/12/2016 CLINICAL DATA:  Status post intubation. EXAM: PORTABLE CHEST 1 VIEW COMPARISON:  07/12/2016 at 1420 hours FINDINGS: Endotracheal tube tip projects 19 mm  above the carina, well positioned. New nasal/ orogastric tube extends well below the diaphragm into the stomach and below the included field of view. Right internal jugular central venous line tip now projects in the lower superior vena cava above the right atrium and caval atrial junction. Lungs are clear.  No pleural effusion or pneumothorax. IMPRESSION: 1. Well-positioned endotracheal tube, nasal/orogastric tube and right internal jugular central venous line. 2. No pneumothorax.  No acute findings in the lungs. Electronically Signed   By: Amie Portland M.D.   On: 07/12/2016 18:32   Dg Chest Portable 1 View  Result Date: 07/12/2016 CLINICAL DATA:  Central line placement.  Unresponsive. EXAM: PORTABLE CHEST 1 VIEW COMPARISON:  07/05/2016 FINDINGS: Indistinct upper zone vasculature probably at least partially due to supine positioning. Heart size within normal limits. Right IJ line tip just over 3 vertebral body lengths below the carina, compatible with the right atrium. If cavoatrial junction positioning is desired, consider retraction by 3.5 cm. Low lung volumes are present, causing crowding of the pulmonary vasculature. No  airspace opacity identified. IMPRESSION: 1. Right IJ line tip:  Right atrium.  No pneumothorax. 2. Low lung volumes are present, causing crowding of the pulmonary vasculature. Electronically Signed   By: Gaylyn RongWalter  Liebkemann M.D.   On: 07/12/2016 14:37     STUDIES:  CT head 8/8 > old b/l infarcts with subacute infarct again identified at inferior left caudate.  No acute process.  CULTURES: Blood 8/8 > Sputum 8/8 > Urine 8/8 >  ANTIBIOTICS: Vanc 8/8 > Zosyn 8/8 >  SIGNIFICANT EVENTS: 8/8 > admitted with unresponsiveness, required intubation. 8/9 low cvp, levophed, possible seizure   LINES/TUBES: ETT 8/8 > R IJ CVL 8/8 > ETT 8/8>>>  DISCUSSION: 50 y.o. M with complex medical history and multiple recent admissions, resides in nursing home, admitted again 8/8 with  unresponsiveness and inability to protect airway, intubated in ED.  Hypotensive and hypothermic but has this chronically due to adrenal insufficiency.  No evidence of sepsis.  ASSESSMENT / PLAN:  PULMONARY A: Acute hypoxic respiratory failure - due to inability to protect airway. 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 P:   Low support on vent, MS does not allow extubation Assess sbt attempt 30 min  Albuterol PRN. Daily CXR.  CARDIOVASCULAR A:  Shock - unclear etiology.  Suspect due to adrenal insufficiency vs decompensated heart failure (less likely).  Doubt any component of sepsis though is at risk given his multiple admissions. Hx cardiac arrest, sCHF (echo from 07/05/16 with EF 40-45%), HLD. P:  Continue levophed for goal MAP > 65. Stress steroids to IV Continue outpatient ASA, atorvastatin. Ensure tsh done cvp low, bolus aggressive  Follow cvp trend  RENAL A:   Hypernatremia (no brain edema), hypomag P:   Correct electrolytes as indicated. BMP in AM Mag supp bmet in pm  GASTROINTESTINAL A:   VTE prophylaxis. Nutrition. P:   SUP: famotidine. STARt TF  HEMATOLOGIC A:   Anemia - chronic. VTE prophylaxis. P:  Transfuse for Hgb < 7. SCD's / Heparin CBC in AM.  INFECTIOUS A:   Hypothermia + hypotension - chronic due to adrenal insufficiency.  UTI P:   Monitor clinically. Follow cultures. PCT can be used with steroid use - will obtain monotherpay zosyn noted Add vanc empiric with shock for now  ENDOCRINE A:   Adrenal insufficiency - has had trouble weaning off IV hydrocortisone. Hyperglycemia - at risk for exacerbation with steroids. P:   Continue outpatient hydrocortisone, florinef. SSI.  NEUROLOGIC A:   Acute encephalopathy. Hx seizures, strep pneumo meningitis, CVA. P:   Sedation:  fentanyl gtt / midazolam PRN. RASS Goal: 0 to -1. Daily WUA., dc prop with shock add fent Continue outpatient Depakote (ensure level), keppra, thiamine,  folic acid.  Family updated: None.  Interdisciplinary Family Meeting v Palliative Care Meeting:  Due by: 8/14.  CC time: 60 minutes.  [name] [name], MD, FACP Pgr: [phone] [facility]

## 2016-07-13 NOTE — Procedures (Signed)
ELECTROENCEPHALOGRAM REPORT  Date of Study: 07/13/2016  Patient's Name: Dale [name] MRN: [id number] Date of Birth: April 02, 1966  Referring Provider: Rory Percyaniel Feinstein, MD  Clinical History: 50 y.o. M with complex medical history and multiple recent admissions (strep pneumo meningitis, cardiac arrest, cardiogenic shock, ARDS, CVA, seizures) resides in nursing home, admitted again 8/8 with unresponsiveness and inability to protect airway, intubated in ED.  Hypotensive and hypothermic but has this chronically due to adrenal insufficiency.  Nurse stated this morning he had an episode of generalized shaking with head nodding and eyes would open. Pt received 4 mg of ativan. no repeat episodes.   Medications:  albuterol (PROVENTIL) (2.5 MG/3ML) 0.083% nebulizer solution 2.5 mg   aspirin tablet 325 mg   atorvastatin (LIPITOR) tablet 20 mg   bisacodyl (DULCOLAX) suppository 10 mg   dextrose 5 %-0.45 % sodium chloride infusion   famotidine (PEPCID) 40 MG/5ML suspension 20 mg   feeding supplement (VITAL AF 1.2 CAL) liquid 1,000 mL   fentaNYL (SUBLIMAZE) 2,500 mcg in sodium chloride 0.9 % 250 mL (10 mcg/mL) infusion   fentaNYL (SUBLIMAZE) bolus via infusion 50 mcg   fentaNYL (SUBLIMAZE) injection 100 mcg   fentaNYL (SUBLIMAZE) injection 100 mcg   fludrocortisone (FLORINEF) tablet 0.1 mg   folic acid (FOLVITE) tablet 1 mg   heparin injection 5,000 Units   hydrocortisone sodium succinate (SOLU-CORTEF) 100 MG injection 50 mg   insulin aspart (novoLOG) injection 0-20 Units   levETIRAcetam (KEPPRA) 1,500 mg in sodium chloride 0.9 % 100 mL IVPB   midazolam (VERSED) injection 2 mg   midazolam (VERSED) injection 2 mg   norepinephrine (LEVOPHED) 4 mg in dextrose 5 % 250 mL (0.016 mg/mL) infusion   piperacillin-tazobactam (ZOSYN) IVPB 3.375 g   sennosides (SENOKOT) 8.8 MG/5ML syrup 5 mL   thiamine (VITAMIN B-1) tablet 100 mg   valproate (DEPACON) 750 mg in dextrose 5 % 50 mL IVPB   vancomycin (VANCOCIN)  500 mg in sodium chloride 0.9 % 100 mL IVPB  Technical Summary: A multichannel digital EEG recording measured by the international 10-20 system with electrodes applied with paste and impedances below 5000 ohms performed in our laboratory with EKG monitoring in an intubated and sedated patient.  Hyperventilation and photic stimulation were not performed.  The digital EEG was referentially recorded, reformatted, and digitally filtered in a variety of bipolar and referential montages for optimal display.    Description: The patient is intubated and sedated during the recording. There is loss of normal background activity. The records read at a sensitivity of 3 uV/mm shows diffuse suppression of background activity. There is no spontaneous reactivity or reactivity noted with noxious stimulation. There is muscle artifact over the frontal leads. Hyperventilation and photic stimulation were not performed. There were no epileptiform discharges or electrographic seizures seen.   EKG lead was unremarkable.  Impression: This EEG is markedly abnormal due to diffuse background suppression and lack of EEG reactivity with noxious stimulation.  Clinical Correlation of the above findings indicates severe diffuse cerebral dysfunction that is non-specific in etiology and can be seen in the setting of anoxic/ischemic injury, toxic/metabolic encephalopathies, or medication effect. In the absence of CNS active, sedating, or anesthetic medications, this suggests a poor prognosis. Clinical correlation is advised.  Shon MilletAdam Hellon Vaccarella, DO

## 2016-07-13 NOTE — Progress Notes (Signed)
Initial Nutrition Assessment  DOCUMENTATION CODES:   Severe malnutrition in context of chronic illness  INTERVENTION:   Vital AF 1.2 @ 55 ml/hr Provides: 1320 ml, 1584 kcal, 99 grams protein, and 1070 ml H2O.   NUTRITION DIAGNOSIS:   Malnutrition (Severe) related to chronic illness as evidenced by severe depletion of body fat, severe depletion of muscle mass.  GOAL:   Patient will meet greater than or equal to 90% of their needs  MONITOR:   TF tolerance, Skin, I & O's, Vent status, Labs, Weight trends  REASON FOR ASSESSMENT:   Consult Enteral/tube feeding initiation and management  ASSESSMENT:   50 y.o. M with complex medical history and multiple recent admissions (strep pneumo meningitis, cardiac arrest, cardiogenic shock, ARDS, CVA, seizures) resides in nursing home, admitted again 8/8 with unresponsiveness and inability to protect airway, intubated in ED.  Hypotensive and hypothermic but has this chronically due to adrenal insufficiency.    Patient is currently intubated on ventilator support MV: 7.4 L/min Temp (24hrs), Avg:93.3 F (34.1 C), Min:90.7 F (32.6 C), Max:98.6 F (37 C)  Labs reviewed: Na 153, K+3.4, PO4 and Magnesium are WNL CBG's: 84-137 Medications reviewed and include: folvite, solu-cortef, thiamine,  Nutrition-Focused physical exam completed. Findings are severe fat depletion, severe muscle depletion, and no edema.  OG tube, tip in upper stomach  Discussed with RN.   Diet Order:  Diet NPO time specified  Skin:  Wound (see comment) (stage II pressure ulcer on sacrum (doc 7/22))  Last BM:  8/6  Height:   Ht Readings from Last 1 Encounters:  07/13/16 5\' 8"  (1.727 m)    Weight:   Wt Readings from Last 1 Encounters:  07/13/16 123 lb 14.4 oz (56.2 kg)    Ideal Body Weight:  70 kg  BMI:  Body mass index is 18.84 kg/m.  Estimated Nutritional Needs:   Kcal:  1535  Protein:  90-115 grams  Fluid:  > 1.9 L/day  EDUCATION NEEDS:    No education needs identified at this time  Kendell BaneHeather Williemae Muriel RD, LDN, CNSC [phone] Pager [phone] After Hours Pager

## 2016-07-13 NOTE — Progress Notes (Signed)
Pharmacy Antibiotic Note  Dale [name] is a 50 y.o. male admitted from SNF on 07/12/2016 with UTI.  Pharmacy has been consulted for Zosyn dosing. Noted pt just d/c from hospital 8/7 after 3 wk long hospitalization.   Plan: Zosyn 3.375gm IV now over 30 min then 3.375gm IV q8h - subsequent doses over 4 hours Will f/u micro data, renal function, and pt's clinical condition  Height: 5\' 8"  (172.7 cm) Weight: 123 lb 7.3 oz (56 kg) IBW/kg (Calculated) : 68.4  Temp (24hrs), Avg:91.8 F (33.2 C), Min:90.7 F (32.6 C), Max:93.7 F (34.3 C)   Recent Labs Lab 07/06/16 0322 07/07/16 0326 07/09/16 0451 07/10/16 0409 07/11/16 0400 07/12/16 1345  WBC 8.2 5.9 8.6  --  6.4 12.0*  CREATININE 0.76 0.76 0.76 0.84 0.74 1.06  LATICACIDVEN  --   --   --   --   --  2.8*    Estimated Creatinine Clearance: 65.3 mL/min (by C-G formula based on SCr of 1.06 mg/dL).    No Known Allergies  Antimicrobials this admission: 8/9 Zosyn>>  Microbiology results: 8/9 BCx x2: 8/8 BCx x2:  8/9 UCx:    Sputum:   8/8 MRSA PCR:   Thank you for allowing pharmacy to be a part of this patient's care.  Christoper Fabianaron Omkar Stratmann, PharmD, BCPS Clinical pharmacist, pager [phone] 07/13/2016 1:34 AM

## 2016-07-14 ENCOUNTER — Inpatient Hospital Stay (HOSPITAL_COMMUNITY): Payer: Medicaid Other

## 2016-07-14 LAB — GLUCOSE, CAPILLARY
GLUCOSE-CAPILLARY: 114 mg/dL — AB (ref 65–99)
GLUCOSE-CAPILLARY: 122 mg/dL — AB (ref 65–99)
GLUCOSE-CAPILLARY: 90 mg/dL (ref 65–99)
GLUCOSE-CAPILLARY: 97 mg/dL (ref 65–99)
Glucose-Capillary: 92 mg/dL (ref 65–99)

## 2016-07-14 LAB — BLOOD GAS, ARTERIAL
Acid-Base Excess: 0.6 mmol/L (ref 0.0–2.0)
BICARBONATE: 24.8 meq/L — AB (ref 20.0–24.0)
Drawn by: 313061
FIO2: 0.4
MECHVT: 550 mL
O2 Saturation: 99.8 %
PATIENT TEMPERATURE: 98.6
PCO2 ART: 40.6 mmHg (ref 35.0–45.0)
PEEP: 5 cmH2O
PO2 ART: 161 mmHg — AB (ref 80.0–100.0)
RATE: 14 resp/min
TCO2: 26.1 mmol/L (ref 0–100)
pH, Arterial: 7.404 (ref 7.350–7.450)

## 2016-07-14 LAB — CBC
HEMATOCRIT: 25.5 % — AB (ref 39.0–52.0)
HEMOGLOBIN: 7.9 g/dL — AB (ref 13.0–17.0)
MCH: 28.7 pg (ref 26.0–34.0)
MCHC: 31 g/dL (ref 30.0–36.0)
MCV: 92.7 fL (ref 78.0–100.0)
Platelets: 171 10*3/uL (ref 150–400)
RBC: 2.75 MIL/uL — ABNORMAL LOW (ref 4.22–5.81)
RDW: 18.8 % — ABNORMAL HIGH (ref 11.5–15.5)
WBC: 9.4 10*3/uL (ref 4.0–10.5)

## 2016-07-14 LAB — PHOSPHORUS
Phosphorus: 3 mg/dL (ref 2.5–4.6)
Phosphorus: 2.1 mg/dL — ABNORMAL LOW (ref 2.5–4.6)

## 2016-07-14 LAB — PROCALCITONIN

## 2016-07-14 LAB — BASIC METABOLIC PANEL
ANION GAP: 8 (ref 5–15)
BUN: 12 mg/dL (ref 6–20)
CALCIUM: 7.3 mg/dL — AB (ref 8.9–10.3)
CHLORIDE: 115 mmol/L — AB (ref 101–111)
CO2: 25 mmol/L (ref 22–32)
Creatinine, Ser: 1.01 mg/dL (ref 0.61–1.24)
GFR calc Af Amer: >60 mL/min (ref >60)
GFR calc non Af Amer: >60 mL/min (ref >60)
GLUCOSE: 109 mg/dL — AB (ref 65–99)
Potassium: 3.8 mmol/L (ref 3.5–5.1)
Sodium: 148 mmol/L — ABNORMAL HIGH (ref 135–145)

## 2016-07-14 LAB — MAGNESIUM
MAGNESIUM: 2 mg/dL (ref 1.7–2.4)
Magnesium: 1.7 mg/dL (ref 1.7–2.4)

## 2016-07-14 MED ORDER — DEXTROSE 5 % IV SOLN
20.0000 mmol | Freq: Once | INTRAVENOUS | Status: AC
  Administered 2016-07-14: 20 mmol via INTRAVENOUS
  Filled 2016-07-14: qty 6.67

## 2016-07-14 MED ORDER — MAGNESIUM SULFATE IN D5W 1-5 GM/100ML-% IV SOLN
1.0000 g | Freq: Once | INTRAVENOUS | Status: AC
  Administered 2016-07-14: 1 g via INTRAVENOUS
  Filled 2016-07-14: qty 100

## 2016-07-14 NOTE — Progress Notes (Signed)
eLink Physician-Brief Progress Note Patient Name: Dale [name] DOB: 01/15/1966 MRN: [id number]   Date of Service  07/14/2016  HPI/Events of Note  Mg++ = 1.7, PO4--- = 2.1 and Creatinine = 1.01.  eICU Interventions  Will replete Mg++ and PO4---.     Intervention Category Intermediate Interventions: Electrolyte abnormality - evaluation and management  [name] 07/14/2016, 7:17 PM

## 2016-07-14 NOTE — Progress Notes (Signed)
Magnesium and phosphorous results reported to elink personnel.

## 2016-07-14 NOTE — Consult Note (Signed)
PULMONARY / CRITICAL CARE MEDICINE   Name: Dale [name] MRN: [id number] DOB: October 31, 1966    ADMISSION DATE:  07/12/2016 CONSULTATION DATE:  07/12/16  REFERRING MD:  [facility]  CHIEF COMPLAINT:  AMS  HISTORY OF PRESENT ILLNESS:  Pt is encephelopathic; therefore, this HPI is obtained from chart review. Dale [name] is 50 y.o. M with PMH as outlined below including admission in March 2017 with strep pneumo meningitis c/b cardiac arrest and cardiogenic shock, ARDS requiring prolonged intubation and trach (now decannulated), and acute renal failure.  He was recently admitted to [facility] 06/16/16 through 07/11/16 for recurrent hypoglycemia due to adrenal insufficiency, new subacute CVA, seizures with inability to protect airway s/p intubation 7/13 through 7/21.  Has been followed by endocrinology due to inability to wean off IV hydrocortisone, currently on long term hydrocortisone + florinef.  It was recommended that pt be admitted to tertiary center for further hospitalizations.  He was just discharged on 8/7; however, on 8/8, he presented again to [facility] ED due to unresponsiveness and "not acting right" while at nursing home.  While in ED, he was unable to protect airway, therefore required intubation again.  He was later transferred to [facility] for further evaluation and management.  Of note, during last admission, it was strongly recommended that pt be listed as DNR / DNI; however, wife declined.  SUBJECTIVE:  Off pressors, fent remains  VITAL SIGNS: BP 91/67   Pulse 84   Temp 99.1 F (37.3 C)   Resp 10   Ht '5\' 8"'$  (1.727 m)   Wt 62.2 kg (137 lb 2 oz)   SpO2 96%   BMI 20.85 kg/m   HEMODYNAMICS: CVP:  [4 mmHg-8 mmHg] 6 mmHg  VENTILATOR SETTINGS: Vent Mode: PRVC FiO2 (%):  [40 %] 40 % Set Rate:  [14 bmp] 14 bmp Vt Set:  [550 mL] 550 mL PEEP:  [5 cmH20] 5 cmH20 Plateau Pressure:  [10 cmH20-17 cmH20] 12 cmH20  INTAKE / OUTPUT: I/O last 3 completed shifts: In: 6887.5 [I.V.:4505.6; NG/GT:1201.8; IV  Piggyback:1180] Out: 4500 [Urine:4500]   PHYSICAL EXAMINATION: General: Middle aged male, in NAD. Neuro: Sedated, moving ext left, nor rt, lowers not moving HEENT: [place]/AT. PERRL, sclerae anicteric., no nuchal rigidity Cardiovascular: RRR, no M/R/G.  Lungs: ronchi, thick secretions Abdomen: BS x 4, soft, NT/ND.  Musculoskeletal: No gross deformities, no edema.  Skin: Intact, warm, no rashes.  LABS:  BMET  Recent Labs Lab 07/13/16 0448 07/13/16 1312 07/14/16 0330  NA 153* 148* 148*  K 3.4* 3.3* 3.8  CL 117* 119* 115*  CO2 '29 26 25  '$ BUN '12 12 12  '$ CREATININE 0.76 0.95 1.01  GLUCOSE 84 125* 109*    Electrolytes  Recent Labs Lab 07/13/16 0448 07/13/16 1030 07/13/16 1312 07/14/16 0330  CALCIUM 7.5*  --  7.1* 7.3*  MG 1.7 1.7 2.2 2.0  PHOS 3.4 3.5 3.3 3.0    CBC  Recent Labs Lab 07/12/16 1345 07/13/16 0448 07/14/16 0330  WBC 12.0* 10.6* 9.4  HGB 9.5* 8.6* 7.9*  HCT 29.3* 27.6* 25.5*  PLT 187 209 171    Coag's No results for input(s): APTT, INR in the last 168 hours.  Sepsis Markers  Recent Labs Lab 07/12/16 1345 07/13/16 1024 07/13/16 1030 07/14/16 0330  LATICACIDVEN 2.8* 1.6  --   --   PROCALCITON  --   --  0.10 <0.10    ABG  Recent Labs Lab 07/12/16 1850 07/12/16 2359 07/13/16 0440  PHART 7.470* 7.511* 7.427  PCO2ART 35.8  34.0* 42.2  PO2ART 117* 155.0* 161*    Liver Enzymes  Recent Labs Lab 07/09/16 0451 07/11/16 0400 07/12/16 1345  AST 14* 10* 12*  ALT 11* 11* 10*  ALKPHOS 48 45 47  BILITOT 0.4 0.3 0.3  ALBUMIN 2.3* 2.3* 2.4*    Cardiac Enzymes No results for input(s): TROPONINI, PROBNP in the last 168 hours.  Glucose  Recent Labs Lab 07/13/16 1821 07/13/16 1950 07/13/16 2321 07/14/16 0322 07/14/16 0822 07/14/16 1108  GLUCAP 132* 78 137* 114* 122* 90    Imaging Dg Chest Port 1 View  Result Date: 07/14/2016 CLINICAL DATA:  Respiratory failure. EXAM: PORTABLE CHEST 1 VIEW COMPARISON:  07/13/2016. FINDINGS:  Interim placement right IJ line. Tip at the cavoatrial junction. Endotracheal tube in stable position. NG tube tip noted in the stomach. The NG tube side hole is at the gastroesophageal junction. Advancement of the NG tube of approximately 8 cm should be considered. Interim progression of left lower lobe infiltrate and atelectasis. Small left pleural effusion cannot be excluded. No pneumothorax. IMPRESSION: 1. Interim placement of right IJ line, its tip is at the cavoatrial junction. Endotracheal tube in stable position. NG tube tip noted projected over the upper stomach, NG tube side hole at the gastroesophageal junction. Advancement of approximately 8 cm should be considered. 2. Progressive left lower lobe infiltrate atelectasis. Small left pleural effusion cannot be excluded. These results will be called to the ordering clinician or representative by the Radiologist Assistant, and communication documented in the PACS or zVision Dashboard. Electronically Signed   By: Maisie Fus  Register   On: 07/14/2016 07:09    STUDIES:  CT head 8/8 > old b/l infarcts with subacute infarct again identified at inferior left caudate.  No acute process. MRI 8/10>>> eeg 8/10>>>suppressed backround  CULTURES: Blood 8/8 > Sputum 8/8 >mod gram neg rod>>> Urine 8/8 >  ANTIBIOTICS: Vanc 8/8 >>>8/10 Zosyn 8/8 >>>  SIGNIFICANT EVENTS: 8/8 > admitted with unresponsiveness, required intubation. 8/9 low cvp, levophed, possible seizure  8/10- not moving rt ext upper and lowers  LINES/TUBES: ETT 8/8 > R IJ CVL 8/8 > ETT 8/8>>>  DISCUSSION: 50 y.o. M with complex medical history and multiple recent admissions, resides in nursing home, admitted again 8/8 with unresponsiveness and inability to protect airway, intubated in ED.  Hypotensive and hypothermic but has this chronically due to adrenal insufficiency.  No evidence of sepsis.  ASSESSMENT / PLAN:  PULMONARY A: Acute hypoxic respiratory failure - due to inability  to protect airway. 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 LLL HCAP, asp ? P:   pcr in am for LLL follow up ABg reviewed, keep current vent settings SBT wean cpap 5 ps5, goal 2 hrs, if needed increase PS May need early trach Some lower rate, repeat abg to esnure not alk  CARDIOVASCULAR A:  Shock - unclear etiology - improved, Suspect due to adrenal insufficiency vs decompensated heart failure (less likely).  Doubt any component of sepsis though is at risk given his multiple admissions. Hx cardiac arrest, sCHF (echo from 07/05/16 with EF 40-45%), HLD. P:  kvo Stress steroids to remain, just off pressors Continue outpatient ASA, atorvastatin. Ensure tsh done - wnl  RENAL A:   Hypernatremia (no brain edema), hypomag P:   Tolerate NA until we r/o acute cva BMP in AM No free water Use saline   GASTROINTESTINAL A:   VTE prophylaxis. Nutrition. P:   SUP: famotidine. TF remain  HEMATOLOGIC A:   Anemia - chronic. VTE  prophylaxis. No bleeding  P:  Transfuse for Hgb < 7 SCD's / Heparin CBC in AM.  INFECTIOUS A:   Hypothermia + hypotension - AI, r/o LLL HCAP UTI P:   No staph, dc vanc maitain zoysn and follow gram neg rod  ENDOCRINE A:   Adrenal insufficiency - has had trouble weaning off IV hydrocortisone. Hyperglycemia - at risk for exacerbation with steroids. P:   Continue hydrocortisone, florinef. SSI.  NEUROLOGIC A:   Acute encephalopathy. Hx seizures, strep pneumo meningitis, CVA. R/o acute cva P:   Sedation:  fentanyl gtt / midazolam PRN. RASS Goal: 0 to -1. Get mri brain Daily WUA. eeg reviewed Continue outpatient Depakote (ensure level), keppra, thiamine, folic acid.  Family updated: I updated wife  Interdisciplinary Family Meeting v Palliative Care Meeting:  Due by: 8/14.  CC time: 40 minutes.  Dale [name]. Titus Mould, MD, [place] Pgr: [facility]

## 2016-07-14 NOTE — Progress Notes (Addendum)
Number of Stools and consistency  [facility]. Orders received.

## 2016-07-15 ENCOUNTER — Inpatient Hospital Stay (HOSPITAL_COMMUNITY): Payer: Medicaid Other

## 2016-07-15 ENCOUNTER — Encounter (HOSPITAL_COMMUNITY): Payer: Self-pay | Admitting: *Deleted

## 2016-07-15 LAB — BASIC METABOLIC PANEL
Anion gap: 7 (ref 5–15)
BUN: 14 mg/dL (ref 6–20)
CALCIUM: 7.5 mg/dL — AB (ref 8.9–10.3)
CO2: 27 mmol/L (ref 22–32)
CREATININE: 0.79 mg/dL (ref 0.61–1.24)
Chloride: 113 mmol/L — ABNORMAL HIGH (ref 101–111)
GFR calc Af Amer: >60 mL/min (ref >60)
GLUCOSE: 91 mg/dL (ref 65–99)
POTASSIUM: 3.2 mmol/L — AB (ref 3.5–5.1)
SODIUM: 147 mmol/L — AB (ref 135–145)

## 2016-07-15 LAB — GLUCOSE, CAPILLARY
GLUCOSE-CAPILLARY: 102 mg/dL — AB (ref 65–99)
GLUCOSE-CAPILLARY: 108 mg/dL — AB (ref 65–99)
GLUCOSE-CAPILLARY: 84 mg/dL (ref 65–99)
GLUCOSE-CAPILLARY: 95 mg/dL (ref 65–99)
Glucose-Capillary: 97 mg/dL (ref 65–99)
Glucose-Capillary: 115 mg/dL — ABNORMAL HIGH (ref 65–99)

## 2016-07-15 LAB — URINE CULTURE
Culture: 20000 — AB
SPECIAL REQUESTS: NORMAL

## 2016-07-15 LAB — CBC
HEMATOCRIT: 27.8 % — AB (ref 39.0–52.0)
Hemoglobin: 8.7 g/dL — ABNORMAL LOW (ref 13.0–17.0)
MCH: 28.8 pg (ref 26.0–34.0)
MCHC: 31.3 g/dL (ref 30.0–36.0)
MCV: 92.1 fL (ref 78.0–100.0)
PLATELETS: 185 10*3/uL (ref 150–400)
RBC: 3.02 MIL/uL — ABNORMAL LOW (ref 4.22–5.81)
RDW: 18.7 % — AB (ref 11.5–15.5)
WBC: 12 10*3/uL — AB (ref 4.0–10.5)

## 2016-07-15 LAB — APTT: aPTT: 36 seconds (ref 24–36)

## 2016-07-15 LAB — PROTIME-INR
INR: 1
PROTHROMBIN TIME: 13.2 s (ref 11.4–15.2)

## 2016-07-15 LAB — TRIGLYCERIDES: TRIGLYCERIDES: 97 mg/dL (ref <150)

## 2016-07-15 LAB — PROCALCITONIN

## 2016-07-15 MED ORDER — SODIUM CHLORIDE 0.9 % IV SOLN
1.0000 g | Freq: Three times a day (TID) | INTRAVENOUS | Status: AC
  Administered 2016-07-15 – 2016-07-22 (×23): 1 g via INTRAVENOUS
  Filled 2016-07-15 (×23): qty 1

## 2016-07-15 MED ORDER — SODIUM CHLORIDE 0.9 % IV SOLN
500.0000 mg | Freq: Three times a day (TID) | INTRAVENOUS | Status: DC
  Filled 2016-07-15 (×2): qty 500

## 2016-07-15 MED ORDER — POTASSIUM CHLORIDE 20 MEQ/15ML (10%) PO SOLN
30.0000 meq | ORAL | Status: AC
Start: 2016-07-15 — End: 2016-07-15
  Administered 2016-07-15 (×2): 30 meq
  Filled 2016-07-15 (×2): qty 30

## 2016-07-15 NOTE — Progress Notes (Signed)
Patient ID: Dale [name], male   DOB: 01/23/1966, 50 y.o.   MRN: [id number] I updated wife on MRI brain She is leaning toward dnr and no trach, they will decide by Monday MD to call her Monday am  Mcarthur RossettiDaniel J. Tyson [name], MD, FACP Pgr: [phone] [place] [facility]   [name] [name], MD, FACP Pgr: [phone] [place] [facility]

## 2016-07-15 NOTE — Progress Notes (Signed)
Decreased FIO2 to 30% due to stable sats. 

## 2016-07-15 NOTE — Progress Notes (Signed)
eLink Nursing ICU Electrolyte Replacement Protocol  Patient Name: Bonnita HollowCraig D Sachse DOB: 02/26/1966 MRN: [id number]  Date of Service  07/15/2016   HPI/Events of Note    Recent Labs Lab 07/12/16 1345 07/13/16 0448 07/13/16 1030 07/13/16 1312 07/14/16 0330 07/14/16 1600 07/15/16 0400  NA 148* 153*  --  148* 148*  --  147*  K 3.6 3.4*  --  3.3* 3.8  --  3.2*  CL 110 117*  --  119* 115*  --  113*  CO2 30 29  --  26 25  --  27  GLUCOSE 83 84  --  125* 109*  --  91  BUN 27* 12  --  12 12  --  14  CREATININE 1.06 0.76  --  0.95 1.01  --  0.79  CALCIUM 7.8* 7.5*  --  7.1* 7.3*  --  7.5*  MG  --  1.7 1.7 2.2 2.0 1.7  --   PHOS  --  3.4 3.5 3.3 3.0 2.1*  --     Estimated Creatinine Clearance: 96.1 mL/min (by C-G formula based on SCr of 0.8 mg/dL).  Intake/Output      08/10 0701 - 08/11 0700   I.V. (mL/kg) 570 (9.2)   NG/GT 695   IV Piggyback 322.5   Total Intake(mL/kg) 1587.5 (25.5)   Urine (mL/kg/hr) 555 (0.4)   Stool 1 (0)   Total Output 556   Net +1031.5        - I/O DETAILED x24h    Total I/O In: -  Out: 181 [Urine:180; Stool:1] - I/O THIS SHIFT    ASSESSMENT   eICURN Interventions  K+ ordered using ICU replacement protocol   ASSESSMENT: MAJOR ELECTROLYTE    Merita NortonFrye, Jayant Kriz Nicole 07/15/2016, 5:41 AM

## 2016-07-15 NOTE — Progress Notes (Signed)
Wasted 75cc of Fentanyl down sink with Research officer, trade unionHannah RN.

## 2016-07-15 NOTE — Progress Notes (Signed)
PULMONARY / CRITICAL CARE MEDICINE   Name: Dale [name] MRN: [id number] DOB: 1966-08-08    ADMISSION DATE:  07/12/2016 CONSULTATION DATE:  07/12/16  REFERRING MD:  [facility]  CHIEF COMPLAINT:  AMS  HISTORY OF PRESENT ILLNESS:  Pt is encephelopathic; therefore, this HPI is obtained from chart review. Dale [name] is 50 y.o. M with PMH as outlined below including admission in March 2017 with strep pneumo meningitis c/b cardiac arrest and cardiogenic shock, ARDS requiring prolonged intubation and trach (now decannulated), and acute renal failure.  He was recently admitted to [facility] 06/16/16 through 07/11/16 for recurrent hypoglycemia due to adrenal insufficiency, new subacute CVA, seizures with inability to protect airway s/p intubation 7/13 through 7/21.  Has been followed by endocrinology due to inability to wean off IV hydrocortisone, currently on long term hydrocortisone + florinef.  It was recommended that pt be admitted to tertiary center for further hospitalizations.  He was just discharged on 8/7; however, on 8/8, he presented again to [facility] ED due to unresponsiveness and "not acting right" while at nursing home.  While in ED, he was unable to protect airway, therefore required intubation again.  He was later transferred to [facility] for further evaluation and management.  Of note, during last admission, it was strongly recommended that pt be listed as DNR / DNI; however, wife declined.  SUBJECTIVE:  Heavily sedated. I suspect he will need trach due to variable mental status and inabilty to protect airway.   VITAL SIGNS: BP 90/69   Pulse (!) 55   Temp 97.2 F (36.2 C)   Resp 14   Ht  (1.727 m)   Wt 141 lb 5 oz (64.1 kg)   SpO2 100%   BMI 21.49 kg/m   HEMODYNAMICS: CVP:  [3 mmHg-5 mmHg] 3 mmHg  VENTILATOR SETTINGS: Vent Mode: PRVC FiO2 (%):  [40 %] 40 % Set Rate:  [14 bmp] 14 bmp Vt Set:  [550 mL] 550 mL PEEP:  [5 cmH20] 5 cmH20 Plateau Pressure:  [12 cmH20-16 cmH20] 14 cmH20  INTAKE /  OUTPUT: I/O last 3 completed shifts: In: 5406.2 [I.V.:2139.3; WU/JW:1191.4; IV Piggyback:1152.5] Out: 1286 [Urine:1285; Stool:1]   PHYSICAL EXAMINATION: General: Middle aged male, in NAD. Neuro: Sedated, flaccid, over sedated, reported to be agitated  HEENT: [place]/AT. PERRL, sclerae anicteric., no nuchal rigidity Cardiovascular: RRR, no M/R/G.  Lungs: ronchi, thick secretions gray  Abdomen: BS x 4, soft, NT/ND.  Musculoskeletal: No gross deformities, no edema.  Skin: Intact, warm, no rashes.  LABS:  BMET  Recent Labs Lab 07/13/16 1312 07/14/16 0330 07/15/16 0400  NA 148* 148* 147*  K 3.3* 3.8 3.2*  CL 119* 115* 113*  CO2 BUN CREATININE 0.95 1.01 0.79  GLUCOSE 125* 109* 91    Electrolytes  Recent Labs Lab 07/13/16 1312 07/14/16 0330 07/14/16 1600 07/15/16 0400  CALCIUM 7.1* 7.3*  --  7.5*  MG 2.2 2.0 1.7  --   PHOS 3.3 3.0 2.1*  --     CBC  Recent Labs Lab 07/13/16 0448 07/14/16 0330 07/15/16 0400  WBC 10.6* 9.4 12.0*  HGB 8.6* 7.9* 8.7*  HCT 27.6* 25.5* 27.8*  PLT 209 171 185    Coag's No results for input(s): APTT, INR in the last 168 hours.  Sepsis Markers  Recent Labs Lab 07/12/16 1345 07/13/16 1024 07/13/16 1030 07/14/16 0330 07/15/16 0400  LATICACIDVEN 2.8* 1.6  --   --   --   PROCALCITON  --   --  0.10 <0.10 <0.10    ABG  Recent Labs Lab 07/12/16 2359 07/13/16 0440 07/14/16 1345  PHART 7.511* 7.427 7.404  PCO2ART 34.0* 42.2 40.6  PO2ART 155.0* 161* 161*    Liver Enzymes  Recent Labs Lab 07/09/16 0451 07/11/16 0400 07/12/16 1345  AST 14* 10* 12*  ALT 11* 11* 10*  ALKPHOS 48 45 47  BILITOT 0.4 0.3 0.3  ALBUMIN 2.3* 2.3* 2.4*    Cardiac Enzymes No results for input(s): TROPONINI, PROBNP in the last 168 hours.  Glucose  Recent Labs Lab 07/14/16 1108 07/14/16 1543 07/14/16 1947 07/15/16 0057 07/15/16 0359 07/15/16 0811  GLUCAP 90 92 97 95 84 97    Imaging Mr Brain Wo  Contrast  Result Date: 07/15/2016 CLINICAL DATA:  Initial evaluation for acute stroke. History of recent strep pneumo meningitis and subacute CVA. Fall he or the way he all walking and EXAM: MRI HEAD WITHOUT CONTRAST TECHNIQUE: Multiplanar, multiecho pulse sequences of the brain and surrounding structures were obtained without intravenous contrast. COMPARISON:  Prior CT from 07/12/2016 as well as multiple previous MRIs. FINDINGS: Diffuse prominence of the CSF containing spaces is compatible with generalized cerebral atrophy. Patchy and confluent T2/FLAIR hyperintensity within the periventricular and deep white matter both cerebral hemispheres most consistent with chronic small vessel ischemic disease. Remote lacunar infarcts present within the left basal ganglia/corona radiata. Additional remote infarct involving the left thalamus extending towards the left cerebral peduncle. Remote lacunar infarct present within the right basal ganglia as well. There is new/worsened diffusion abnormality involving the left basal ganglia, primarily involving the left caudate head and anterior limb of the left internal capsule (series 4, image 25). Differential considerations include recurrent ischemia within this region, although this would be subacute in nature given the associated T2/FLAIR signal abnormality and relatively normal ADC signal. Possible toxic metabolic derangement could also conceivably be considered. Overall, the signal changes are similar relative to previous MRI from 03/24/2016. Few scattered patchy multi focal foci of restricted diffusion are present within the left MCA territory, primarily involving the left frontal operculum and insular region, with extension towards the left basal ganglia. These are predominantly subcentimeter in nature. No associated hemorrhage or mass effect. Punctate focus of restricted diffusion within the right caudate had also suspicious for possible small infarct (series 4, image 27).  No other evidence for acute ischemia. Gray-white matter differentiation otherwise largely maintained. Major intracranial vascular flow voids are preserved. No acute intracranial hemorrhage. Small amount of chronic blood products at the left basal ganglia. Similar FLAIR signal abnormality at the right frontal and parietal cortex. Again noted is subtle laminar necrosis within the right parieto-occipital region. The No mass lesion, midline shift, or mass effect. No hydrocephalus. Layering debris again noted within the occipital horns bilaterally, similar to prior, and likely related to history of prior meningitis. No extra-axial fluid collection. Major dural sinuses are grossly patent. Craniocervical junction normal. Visualized upper cervical spine within normal limits. Pituitary gland normal. No acute abnormality about the globes and orbits. Mucosal thickening with fluid level present within the left maxillary sinus. Scattered opacity mucosal thickening within the remainder of the paranasal sinuses. Fluid within the posterior nasopharynx. Bilateral mastoid effusions. Patient likely intubated. Bone marrow signal intensity within normal limits. No scalp soft tissue abnormality. IMPRESSION: 1. Patchy small volume multi focal acute ischemic left MCA territory infarcts, primarily involving the left frontal operculum and insular region. Possible additional tiny punctate infarct within the right caudate head. 2. Recurrent diffusion and T2/FLAIR signal abnormality within  the left basal ganglia. This is worsened relative to most recent MRI from 06/21/2016, but similar relative to prior MRI from 03/24/2016. Differential considerations include recurrent subacute ischemia within this region. Possible toxic metabolic derangement could also be considered. 3. Otherwise relatively stable appearance of the brain as compared to recent MRI from 06/21/2016. Electronically Signed   By: Rise Mu M.D.   On: 07/15/2016 04:04     STUDIES:  CT head 8/8 > old b/l infarcts with subacute infarct again identified at inferior left caudate.  No acute process. MRI 8/10>>>brain atrophy ,small left mca infarcts,  eeg 8/10>>>suppressed backround  CULTURES: Blood 8/8 > Sputum 8/8 >mod gram neg rod>>> Urine 8/8 >ENTEROBACTER CLOACAE rr to zoysn  ANTIBIOTICS: Vanc 8/8 >>>8/10 Zosyn 8/8 >>>8/11 Primaxin 8/11>>  SIGNIFICANT EVENTS: 8/8 > admitted with unresponsiveness, required intubation. 8/9 low cvp, levophed, possible seizure  8/10- not moving rt ext upper and lowers  LINES/TUBES: ETT 8/8 > R IJ CVL 8/8 >   DISCUSSION: 50 y.o. M with complex medical history and multiple recent admissions, resides in nursing home, admitted again 8/8 with unresponsiveness and inability to protect airway, intubated in ED.  Hypotensive and hypothermic but has this chronically due to adrenal insufficiency.  No evidence of sepsis. +UTI with enterobacter clo. RR to zoysn. He will most likely need a trach and tx to LTAC.  ASSESSMENT / PLAN:  PULMONARY A: Acute hypoxic respiratory failure - due to inability to protect airway. 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 LLL HCAP, asp ? P:   pcr  for LLL follow up SBT wean cpap 5 ps5, goal 2 hrs, if needed increase PS, either sedated or agitated. May need early trach   CARDIOVASCULAR A:  Shock - unclear etiology - improved, Suspect due to adrenal insufficiency vs decompensated heart failure (less likely).  Doubt any component of sepsis though is at risk given his multiple admissions. Hx cardiac arrest, sCHF (echo from 07/05/16 with EF 40-45%), HLD. resolved P:  kvo Stress steroids to remain,  off pressors Continue outpatient ASA, atorvastatin.   RENAL  Recent Labs Lab 07/13/16 1312 07/14/16 0330 07/15/16 0400  NA 148* 148* 147*    Recent Labs Lab 07/13/16 1312 07/14/16 0330 07/15/16 0400  K 3.3* 3.8 3.2*      A:   Hypernatremia (no brain edema),  hypomag Hypokalemia P:   Tolerate NA until we r/o acute cva BMP No free water Use saline  Replete K+  GASTROINTESTINAL A:   VTE prophylaxis. Nutrition. P:   SUP: famotidine. TF remain  HEMATOLOGIC  Recent Labs  07/14/16 0330 07/15/16 0400  HGB 7.9* 8.7*    A:   Anemia - chronic. VTE prophylaxis. No bleeding  P:  Transfuse for Hgb < 7 SCD's / Heparin CBC  INFECTIOUS A:   Hypothermia + hypotension - AI, r/o LLL HCAP UTI enterobacter Cloacae rr to zosyn SS to imipenem  P:   No staph, dc vanc dc zoysn and transition to Primaxin 8/11  ENDOCRINE A:   Adrenal insufficiency - has had trouble weaning off IV hydrocortisone. Hyperglycemia - at risk for exacerbation with steroids. P:   Continue hydrocortisone, florinef. SSI.  NEUROLOGIC A:   Acute encephalopathy. Hx seizures, strep pneumo meningitis, CVA. R/o acute cva P:   Sedation:  fentanyl gtt / midazolam PRN. RASS Goal: 0 to -1. Get mri brain Daily WUA. Agitated or sedated eeg reviewed Continue outpatient Depakote (ensure level), keppra, thiamine, folic acid.  Family updated: 8/11 no family  at bedside  Interdisciplinary Family Meeting v Palliative Care Meeting:  Due by: 8/14.  Brett Canales Minor ACNP Ad[facility] Pollack PCCM Pager [phone] till 3 pm If no answer page [phone] 07/15/2016, 9:17 AM  STAFF NOTE: I, Rory Percy, MD FACP have personally reviewed patient's available data, including medical history, events of note, physical examination and test results as part of my evaluation. I have discussed with resident/NP and other care providers such as pharmacist, RN and RRT. In addition, I personally evaluated patient and elicited key findings of: not awake, bot following commands,  Moves left arm, MRI reviewed I am calling radiology to determine if left MA is acute, weaning cpap 5 ps 5, goal 2 hrs, if fails increase ps, if we are to continue support would need trach early, overall I think this would be futile,  UPDATE: MRI is acute, will call stroke service, likley ischemic could be embolic, unclear if enterobacter is pathogen but given Urine analysis would treat, change to Meropenem, will call wife, we need comfort approach The patient is critically ill with multiple organ systems failure and requires high complexity decision making for assessment and support, frequent evaluation and titration of therapies, application of advanced monitoring technologies and extensive interpretation of multiple databases.   Critical Care Time devoted to patient care services described in this note is 35 Minutes. This time reflects time of care of this signee: Rory Percy, MD FACP. This critical care time does not reflect procedure time, or teaching time or supervisory time of PA/NP/Med student/Med Resident etc but could involve care discussion time. Rest per NP/medical resident whose note is outlined above and that I agree with   Mcarthur Rossetti. Tyson Alias, MD, FACP Pgr: [phone] [place] 07/15/2016 10:50 AM

## 2016-07-15 NOTE — Progress Notes (Addendum)
Pharmacy Antibiotic Note  Dale [name] is a 50 y.o. male admitted from SNF on 07/12/2016 with UTI sepsis requiring norepinephrine.  Pharmacy has been consulted for Imipenem. Noted pt just d/c from hospital 8/7 after 3 wk long hospitalization and recent strep pneumo meningitis.  Hypothermic on admission 90.6 improved 98 now, WBC 12, Cr 0.79.  Plan: Merrem 1g every 8 hours Will f/u micro data, renal function, and pt's clinical condition  Height: 5\' 8"  (172.7 cm) Weight: 141 lb 5 oz (64.1 kg) IBW/kg (Calculated) : 68.4  Temp (24hrs), Avg:97.4 F (36.3 C), Min:96.6 F (35.9 C), Max:98.1 F (36.7 C)   Recent Labs Lab 07/11/16 0400 07/12/16 1345 07/13/16 0448 07/13/16 1024 07/13/16 1312 07/14/16 0330 07/15/16 0400  WBC 6.4 12.0* 10.6*  --   --  9.4 12.0*  CREATININE 0.74 1.06 0.76  --  0.95 1.01 0.79  LATICACIDVEN  --  2.8*  --  1.6  --   --   --     Estimated Creatinine Clearance: 99 mL/min (by C-G formula based on SCr of 0.8 mg/dL).    No Known Allergies  Antimicrobials this admission: 8/9 Zosyn>>8/11 8/9 vanc>8/10 8/11 Imipenem> then switched to Merrem due to history of seizures (no dose given) 8/11 Merrem >  Microbiology results: 8/9 BCx x2: 8/8 BCx x2:  8/8 UCx: enterobacter cloacae - zosyn resistent  Sputum:   8/8 MRSA PCR:   Dale [name], PharmD Clinical Pharmacy Resident Pager: [phone] 07/15/16 10:08 AM

## 2016-07-15 NOTE — Progress Notes (Signed)
Per MD for RT to do an SBT. Pt had no effort at this time. Placed pt back on same full support settings.  ETT holder changed with no complications

## 2016-07-16 LAB — C DIFFICILE QUICK SCREEN W PCR REFLEX
C DIFFICILE (CDIFF) TOXIN: POSITIVE — AB
C DIFFICLE (CDIFF) ANTIGEN: POSITIVE — AB
C Diff interpretation: DETECTED

## 2016-07-16 LAB — CBC
HEMATOCRIT: 31.6 % — AB (ref 39.0–52.0)
Hemoglobin: 9.8 g/dL — ABNORMAL LOW (ref 13.0–17.0)
MCH: 28.2 pg (ref 26.0–34.0)
MCHC: 31 g/dL (ref 30.0–36.0)
MCV: 91.1 fL (ref 78.0–100.0)
Platelets: 237 10*3/uL (ref 150–400)
RBC: 3.47 MIL/uL — ABNORMAL LOW (ref 4.22–5.81)
RDW: 18.9 % — AB (ref 11.5–15.5)
WBC: 12.2 10*3/uL — ABNORMAL HIGH (ref 4.0–10.5)

## 2016-07-16 LAB — BASIC METABOLIC PANEL
Anion gap: 6 (ref 5–15)
BUN: 14 mg/dL (ref 6–20)
CO2: 30 mmol/L (ref 22–32)
Calcium: 7.8 mg/dL — ABNORMAL LOW (ref 8.9–10.3)
Chloride: 112 mmol/L — ABNORMAL HIGH (ref 101–111)
Creatinine, Ser: 0.74 mg/dL (ref 0.61–1.24)
GFR calc Af Amer: >60 mL/min (ref >60)
GLUCOSE: 108 mg/dL — AB (ref 65–99)
POTASSIUM: 3.7 mmol/L (ref 3.5–5.1)
Sodium: 148 mmol/L — ABNORMAL HIGH (ref 135–145)

## 2016-07-16 LAB — MAGNESIUM: MAGNESIUM: 2.1 mg/dL (ref 1.7–2.4)

## 2016-07-16 LAB — GLUCOSE, CAPILLARY
Glucose-Capillary: 100 mg/dL — ABNORMAL HIGH (ref 65–99)
Glucose-Capillary: 102 mg/dL — ABNORMAL HIGH (ref 65–99)
Glucose-Capillary: 106 mg/dL — ABNORMAL HIGH (ref 65–99)
Glucose-Capillary: 77 mg/dL (ref 65–99)
Glucose-Capillary: 84 mg/dL (ref 65–99)
Glucose-Capillary: 89 mg/dL (ref 65–99)
Glucose-Capillary: 99 mg/dL (ref 65–99)

## 2016-07-16 LAB — PHOSPHORUS: Phosphorus: 2.8 mg/dL (ref 2.5–4.6)

## 2016-07-16 MED ORDER — LEVETIRACETAM 100 MG/ML PO SOLN
1500.0000 mg | Freq: Two times a day (BID) | ORAL | Status: DC
  Administered 2016-07-16 – 2016-08-10 (×50): 1500 mg
  Filled 2016-07-16 (×53): qty 15

## 2016-07-16 MED ORDER — VANCOMYCIN 50 MG/ML ORAL SOLUTION
500.0000 mg | Freq: Four times a day (QID) | ORAL | Status: DC
  Administered 2016-07-16 – 2016-08-10 (×99): 500 mg
  Filled 2016-07-16 (×103): qty 10

## 2016-07-16 MED ORDER — VALPROATE SODIUM 250 MG/5ML PO SOLN
750.0000 mg | Freq: Three times a day (TID) | ORAL | Status: DC
  Administered 2016-07-16 – 2016-08-10 (×74): 750 mg
  Filled 2016-07-16 (×80): qty 15

## 2016-07-16 NOTE — Progress Notes (Addendum)
eLink Physician-Brief Progress Note Patient Name: Dale HollowCraig D Icenhour DOB: 06/10/1966 MRN: [id number]   Date of Service  07/16/2016  HPI/Events of Note  C. Difficile - positive toxin and antigen. History of previous C. Difficile infection.   eICU Interventions  Will order: 1. Vancomycin solution 500 mg via tube now and Q 6 hours.      Intervention Category Major Interventions: Infection - evaluation and management  Sommer,Steven Eugene 07/16/2016, 3:19 PM

## 2016-07-16 NOTE — Progress Notes (Signed)
PULMONARY / CRITICAL CARE MEDICINE   Name: Dale [name] MRN: [id number] DOB: 04/19/1966    ADMISSION DATE:  07/12/2016 CONSULTATION DATE:  07/12/16  REFERRING MD:  APH  CHIEF COMPLAINT:  AMS  SUBJECTIVE:   Weaning pressors.  Didn't tolerate SBT >> no respiratory effort.   VITAL SIGNS: BP 112/71   Pulse (!) 45   Temp 97.7 F (36.5 C)   Resp 14   Ht 5\' 8"  (1.727 m)   Wt 139 lb 15.9 oz (63.5 kg)   SpO2 100%   BMI 21.29 kg/m   HEMODYNAMICS: CVP:  [4 mmHg-8 mmHg] 4 mmHg  VENTILATOR SETTINGS: Vent Mode: PRVC FiO2 (%):  [30 %] 30 % Set Rate:  [14 bmp] 14 bmp Vt Set:  [550 mL] 550 mL PEEP:  [5 cmH20] 5 cmH20 Pressure Support:  [5 cmH20] 5 cmH20 Plateau Pressure:  [15 cmH20-18 cmH20] 18 cmH20  INTAKE / OUTPUT: I/O last 3 completed shifts: In: 3506.3 [I.V.:471.3; NG/GT:2140; IV Piggyback:895] Out: 1421 [Urine:1420; Stool:1]  PHYSICAL EXAMINATION: General: ill appearing Neuro: RASS -3, moves extremities HEENT: ETT in place Cardiac: regular, bradycardic Chest: scattered rhonchi Abd: soft, non tender Ext: 1+ edema Skin: sacral pressure ulcer  LABS:  BMET  Recent Labs Lab 07/14/16 0330 07/15/16 0400 07/16/16 0400  NA 148* 147* 148*  K 3.8 3.2* 3.7  CL 115* 113* 112*  CO2 25 27 30   BUN 12 14 14   CREATININE 1.01 0.79 0.74  GLUCOSE 109* 91 108*    Electrolytes  Recent Labs Lab 07/14/16 0330 07/14/16 1600 07/15/16 0400 07/16/16 0400  CALCIUM 7.3*  --  7.5* 7.8*  MG 2.0 1.7  --  2.1  PHOS 3.0 2.1*  --  2.8    CBC  Recent Labs Lab 07/14/16 0330 07/15/16 0400 07/16/16 0400  WBC 9.4 12.0* 12.2*  HGB 7.9* 8.7* 9.8*  HCT 25.5* 27.8* 31.6*  PLT 171 185 237    Coag's  Recent Labs Lab 07/15/16 1300  APTT 36  INR 1.00    Sepsis Markers  Recent Labs Lab 07/12/16 1345 07/13/16 1024 07/13/16 1030 07/14/16 0330 07/15/16 0400  LATICACIDVEN 2.8* 1.6  --   --   --   PROCALCITON  --   --  0.10 <0.10 <0.10    ABG  Recent Labs Lab  07/12/16 2359 07/13/16 0440 07/14/16 1345  PHART 7.511* 7.427 7.404  PCO2ART 34.0* 42.2 40.6  PO2ART 155.0* 161* 161*    Liver Enzymes  Recent Labs Lab 07/11/16 0400 07/12/16 1345  AST 10* 12*  ALT 11* 10*  ALKPHOS 45 47  BILITOT 0.3 0.3  ALBUMIN 2.3* 2.4*    Cardiac Enzymes No results for input(s): TROPONINI, PROBNP in the last 168 hours.  Glucose  Recent Labs Lab 07/15/16 1302 07/15/16 1509 07/15/16 2045 07/16/16 0005 07/16/16 0346 07/16/16 0812  GLUCAP 102* 108* 115* 84 77 99    Imaging No results found.  STUDIES:  CT head 8/8 >> old b/l infarcts with subacute infarct again identified at inferior left caudate.  No acute process. EEG 8/10 >>suppressed backround MRI brain 8/11 >> multifocal acute Lt MCA ischemic infarcts, possible infarct Rt caudate head  CULTURES: 8/08 Urine >> Enterobacter 8/08 Blood >>  8/09 Sputum >> GNR >>  8/09 Blood >>   ANTIBIOTICS: Vanc 8/8 >>8/10 Zosyn 8/8 >>8/11 Primaxin 8/11>>  SIGNIFICANT EVENTS: 8/08admitted with unresponsiveness, required intubation. 8/09 low cvp, levophed, possible seizure  8/10 not moving rt ext upper and lowers  LINES/TUBES: ETT 8/8 >  R IJ CVL 8/8 >  DISCUSSION: 50 yo male from NH with altered mental status, inability to protect airway.  He was hypotensive and hypothermic from UTI.  He has PMHx of combined CHF, Streptococcal meningitis, ETOH, Depression, Seizures, Dementia, CVA.  ASSESSMENT / PLAN:  PULMONARY A: Acute hypoxic respiratory failure - due to inability to protect airway. 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 P:   Pressure support wean as tolerated F/u CXR Will need to d/w pt's wife about whether he is candidate for trach again  CARDIOVASCULAR A:  Septic shock from UTI. Chronic combined CHF, HLD. P:  Wean pressors to keep MAP > 65 Continue ASA, lipitor  RENAL A:   Hypernatremia. Hypokalemia. P:   F/u BMET  GASTROINTESTINAL A:   Nutrition. P:    Tube feeds Pepcid for SUP  HEMATOLOGIC A:   Anemia of chronic disease and critical illness. P:  F/u CBC SQ heparin for DVT prophylaxis  INFECTIOUS A:   UTI with Enterobacter >> MDR. P:   Continue primaxin  ENDOCRINE A:   Chronic adrenal insufficiency. P:   Florinef, solu cortef  NEUROLOGIC A:   Acute encephalopathy. Hx seizures, strep pneumo meningitis, CVA. P:   Monitor mental status Continue depakote, keppra    CC time 32 minutes.  Coralyn Helling, MD [facility] Pulmonary/Critical Care 07/16/2016, 9:18 AM Pager:  [phone] After 3pm call: [phone]

## 2016-07-16 NOTE — Progress Notes (Signed)
[facility] positive C-Diff toxin A+B from sample collected on 07/12/16 @ [place].  Patient put on enteric precautions and MD notified.  New stool specimen sent per MD order. Will continue to monitor. [place], Mitzi HansenJessica Marie

## 2016-07-17 ENCOUNTER — Inpatient Hospital Stay (HOSPITAL_COMMUNITY): Payer: Medicaid Other

## 2016-07-17 LAB — CBC
HCT: 30 % — ABNORMAL LOW (ref 39.0–52.0)
HEMOGLOBIN: 9.2 g/dL — AB (ref 13.0–17.0)
MCH: 28.4 pg (ref 26.0–34.0)
MCHC: 30.7 g/dL (ref 30.0–36.0)
MCV: 92.6 fL (ref 78.0–100.0)
PLATELETS: 227 10*3/uL (ref 150–400)
RBC: 3.24 MIL/uL — AB (ref 4.22–5.81)
RDW: 18.5 % — ABNORMAL HIGH (ref 11.5–15.5)
WBC: 8.1 10*3/uL (ref 4.0–10.5)

## 2016-07-17 LAB — BASIC METABOLIC PANEL
Anion gap: 12 (ref 5–15)
BUN: 11 mg/dL (ref 6–20)
CALCIUM: 7.7 mg/dL — AB (ref 8.9–10.3)
CO2: 32 mmol/L (ref 22–32)
CREATININE: 0.64 mg/dL (ref 0.61–1.24)
Chloride: 104 mmol/L (ref 101–111)
GFR calc non Af Amer: >60 mL/min (ref >60)
GLUCOSE: 113 mg/dL — AB (ref 65–99)
Potassium: 3 mmol/L — ABNORMAL LOW (ref 3.5–5.1)
Sodium: 148 mmol/L — ABNORMAL HIGH (ref 135–145)

## 2016-07-17 LAB — CULTURE, RESPIRATORY: SPECIAL REQUESTS: NORMAL

## 2016-07-17 LAB — CULTURE, RESPIRATORY W GRAM STAIN

## 2016-07-17 LAB — GLUCOSE, CAPILLARY
GLUCOSE-CAPILLARY: 85 mg/dL (ref 65–99)
GLUCOSE-CAPILLARY: 99 mg/dL (ref 65–99)
GLUCOSE-CAPILLARY: 100 mg/dL — AB (ref 65–99)
Glucose-Capillary: 103 mg/dL — ABNORMAL HIGH (ref 65–99)
Glucose-Capillary: 89 mg/dL (ref 65–99)

## 2016-07-17 MED ORDER — POTASSIUM CHLORIDE 20 MEQ/15ML (10%) PO SOLN
30.0000 meq | ORAL | Status: AC
  Administered 2016-07-17 (×2): 30 meq
  Filled 2016-07-17 (×2): qty 30

## 2016-07-17 MED ORDER — FREE WATER
200.0000 mL | Freq: Four times a day (QID) | Status: DC
  Administered 2016-07-17 – 2016-07-18 (×5): 200 mL

## 2016-07-17 MED ORDER — SODIUM CHLORIDE 0.9 % IV SOLN
INTRAVENOUS | Status: DC
  Administered 2016-07-17 – 2016-08-03 (×5): via INTRAVENOUS

## 2016-07-17 NOTE — Progress Notes (Signed)
eLink Nursing ICU Electrolyte Replacement Protocol  Patient Name: Dale HollowCraig D Canevari DOB: 11/14/1966 MRN: [id number]  Date of Service  07/17/2016   HPI/Events of Note    Recent Labs Lab 07/13/16 1030 07/13/16 1312 07/14/16 0330 07/14/16 1600 07/15/16 0400 07/16/16 0400 07/17/16 0445  NA  --  148* 148*  --  147* 148* 148*  K  --  3.3* 3.8  --  3.2* 3.7 3.0*  CL  --  119* 115*  --  113* 112* 104  CO2  --  26 25  --  27 30 32  GLUCOSE  --  125* 109*  --  91 108* 113*  BUN  --  12 12  --  14 14 11   CREATININE  --  0.95 1.01  --  0.79 0.74 0.64  CALCIUM  --  7.1* 7.3*  --  7.5* 7.8* 7.7*  MG 1.7 2.2 2.0 1.7  --  2.1  --   PHOS 3.5 3.3 3.0 2.1*  --  2.8  --     Estimated Creatinine Clearance: 99.8 mL/min (by C-G formula based on SCr of 0.8 mg/dL).  Intake/Output      08/12 0701 - 08/13 0700   P.O. 0   I.V. (mL/kg) 510.4 (7.9)   NG/GT 1415   IV Piggyback 200   Total Intake(mL/kg) 2125.4 (32.9)   Urine (mL/kg/hr) 1730 (1.1)   Total Output 1730   Net +395.4        - I/O DETAILED x24h    Total I/O In: 750 [I.V.:225; NG/GT:425; IV Piggyback:100] Out: 525 [Urine:525] - I/O THIS SHIFT    ASSESSMENT   eICURN Interventions  K+ 3.0  Replaced using ICU protocol   ASSESSMENT: MAJOR ELECTROLYTE    Merita NortonFrye, Al Bracewell Nicole 07/17/2016, 6:19 AM

## 2016-07-17 NOTE — Progress Notes (Signed)
PULMONARY / CRITICAL CARE MEDICINE   Name: Dale [name] MRN: [id number] DOB: 05/08/1966    ADMISSION DATE:  07/12/2016 CONSULTATION DATE:  07/12/16  REFERRING MD:  APH  CHIEF COMPLAINT:  AMS  SUBJECTIVE:   More alert, follows simple commands.  On low dose pressors.  Didn't tolerate SBT >> low Vt.   VITAL SIGNS: BP (!) 124/91   Pulse (!) 50   Temp 97 F (36.1 C)   Resp 14   Ht 5\' 8"  (1.727 m)   Wt 142 lb 6.7 oz (64.6 kg)   SpO2 100%   BMI 21.65 kg/m   HEMODYNAMICS: CVP:  [4 mmHg] 4 mmHg  VENTILATOR SETTINGS: Vent Mode: PRVC FiO2 (%):  [30 %] 30 % Set Rate:  [14 bmp] 14 bmp Vt Set:  [550 mL] 550 mL PEEP:  [5 cmH20] 5 cmH20 Plateau Pressure:  [15 cmH20-28 cmH20] 17 cmH20  INTAKE / OUTPUT: I/O last 3 completed shifts: In: 3972.9 [I.V.:837.9; NG/GT:2405; IV Piggyback:730] Out: 2250 [Urine:2250]  PHYSICAL EXAMINATION: General: ill appearing Neuro: RASS 0, moves extremities, follows simple commands HEENT: ETT in place Cardiac: regular, bradycardic Chest: scattered rhonchi Abd: soft, non tender Ext: 1+ edema Skin: sacral pressure ulcer  LABS:  BMET  Recent Labs Lab 07/15/16 0400 07/16/16 0400 07/17/16 0445  NA 147* 148* 148*  K 3.2* 3.7 3.0*  CL 113* 112* 104  CO2 27 30 32  BUN 14 14 11   CREATININE 0.79 0.74 0.64  GLUCOSE 91 108* 113*    Electrolytes  Recent Labs Lab 07/14/16 0330 07/14/16 1600 07/15/16 0400 07/16/16 0400 07/17/16 0445  CALCIUM 7.3*  --  7.5* 7.8* 7.7*  MG 2.0 1.7  --  2.1  --   PHOS 3.0 2.1*  --  2.8  --     CBC  Recent Labs Lab 07/15/16 0400 07/16/16 0400 07/17/16 0445  WBC 12.0* 12.2* 8.1  HGB 8.7* 9.8* 9.2*  HCT 27.8* 31.6* 30.0*  PLT 185 237 227    Coag's  Recent Labs Lab 07/15/16 1300  APTT 36  INR 1.00    Sepsis Markers  Recent Labs Lab 07/12/16 1345 07/13/16 1024 07/13/16 1030 07/14/16 0330 07/15/16 0400  LATICACIDVEN 2.8* 1.6  --   --   --   PROCALCITON  --   --  0.10 <0.10 <0.10     ABG  Recent Labs Lab 07/12/16 2359 07/13/16 0440 07/14/16 1345  PHART 7.511* 7.427 7.404  PCO2ART 34.0* 42.2 40.6  PO2ART 155.0* 161* 161*    Liver Enzymes  Recent Labs Lab 07/11/16 0400 07/12/16 1345  AST 10* 12*  ALT 11* 10*  ALKPHOS 45 47  BILITOT 0.3 0.3  ALBUMIN 2.3* 2.4*    Cardiac Enzymes No results for input(s): TROPONINI, PROBNP in the last 168 hours.  Glucose  Recent Labs Lab 07/16/16 1308 07/16/16 1606 07/16/16 2028 07/16/16 2354 07/17/16 0321 07/17/16 0744  GLUCAP 100* 106* 102* 89 85 103*    Imaging Dg Chest Port 1 View  Result Date: 07/17/2016 CLINICAL DATA:  Intubated patient. Respiratory failure. Followup exam. EXAM: PORTABLE CHEST 1 VIEW COMPARISON:  07/14/2016 FINDINGS: Opacity at the left lung base, obscuring the left hemidiaphragm, appears mildly improved. This is consistent with a combination of left pleural fluid with either pneumonia, atelectasis or a combination. There is new hazy right lung base opacity consistent with a small effusion with atelectasis. Remainder of the lungs is clear. No pulmonary edema. Nasal/ orogastric tube appears further inserted now curling within stomach. Endotracheal  tube and right internal jugular central venous line are stable and well positioned. IMPRESSION: 1. Lung aeration at the left base appears mildly improved. There is new mild right lung base hazy opacity consistent with a small effusion and atelectasis. 2. No pulmonary edema or pneumothorax. 3. Support apparatus is well positioned. Electronically Signed   By: Amie Portland M.D.   On: 07/17/2016 07:18    STUDIES:  CT head 8/8 >> old b/l infarcts with subacute infarct again identified at inferior left caudate.  No acute process. EEG 8/10 >>suppressed backround MRI brain 8/11 >> multifocal acute Lt MCA ischemic infarcts, possible infarct Rt caudate head  CULTURES: 8/08 Urine >> Enterobacter 8/08 Blood >>  8/09 Sputum >> Enterobacter 8/09 Blood >>   8/12 C diff >> antigen and toxin positive  ANTIBIOTICS: Vanc 8/8 >>8/10 Zosyn 8/8 >>8/11 Primaxin 8/11>> Oral vancomycin 8/12 >>  SIGNIFICANT EVENTS: 8/08admitted with unresponsiveness, required intubation. 8/09 low cvp, levophed, possible seizure  8/10 not moving rt ext upper and lowers 8/13 Following commands  LINES/TUBES: ETT 8/8 > R IJ CVL 8/8 >  DISCUSSION: 50 yo male from NH with altered mental status, inability to protect airway.  He was hypotensive and hypothermic from UTI.  He has PMHx of combined CHF, Streptococcal meningitis, ETOH, Depression, Seizures, Dementia, CVA.  ASSESSMENT / PLAN:  PULMONARY A: Acute hypoxic respiratory failure - due to inability to protect airway. 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 P:   Pressure support wean as tolerated F/u CXR Will need to d/w pt's wife about whether he is candidate for trach again  CARDIOVASCULAR A:  Septic shock from UTI, HCAP, C diff colitis. Chronic combined CHF, HLD. P:  Wean pressors to keep MAP > 65 Continue ASA, lipitor  RENAL A:   Hypernatremia. Hypokalemia. P:   F/u BMET Free water  GASTROINTESTINAL A:   Nutrition. P:   Tube feeds Pepcid for SUP  HEMATOLOGIC A:   Anemia of chronic disease and critical illness. P:  F/u CBC SQ heparin for DVT prophylaxis  INFECTIOUS A:   UTI, HCAP with Enterobacter >> MDR. C diff colitis. P:   Continue primaxin Continue enteral vancomycin  ENDOCRINE A:   Chronic adrenal insufficiency. P:   Florinef, solu cortef  NEUROLOGIC A:   Acute encephalopathy. Hx seizures, strep pneumo meningitis, CVA. P:   Monitor mental status Continue depakote, keppra    CC time 31 minutes.  Coralyn Helling, MD [facility] Pulmonary/Critical Care 07/17/2016, 8:37 AM Pager:  [phone] After 3pm call: [phone]

## 2016-07-18 ENCOUNTER — Inpatient Hospital Stay (HOSPITAL_COMMUNITY): Payer: Medicaid Other

## 2016-07-18 LAB — GLUCOSE, CAPILLARY
GLUCOSE-CAPILLARY: 113 mg/dL — AB (ref 65–99)
GLUCOSE-CAPILLARY: 125 mg/dL — AB (ref 65–99)
GLUCOSE-CAPILLARY: 60 mg/dL — AB (ref 65–99)
GLUCOSE-CAPILLARY: 77 mg/dL (ref 65–99)
Glucose-Capillary: 105 mg/dL — ABNORMAL HIGH (ref 65–99)
Glucose-Capillary: 105 mg/dL — ABNORMAL HIGH (ref 65–99)
Glucose-Capillary: 108 mg/dL — ABNORMAL HIGH (ref 65–99)
Glucose-Capillary: 111 mg/dL — ABNORMAL HIGH (ref 65–99)

## 2016-07-18 LAB — CULTURE, BLOOD (ROUTINE X 2)
CULTURE: NO GROWTH
CULTURE: NO GROWTH
CULTURE: NO GROWTH
Culture: NO GROWTH

## 2016-07-18 LAB — CBC
HEMATOCRIT: 26.8 % — AB (ref 39.0–52.0)
Hemoglobin: 8.4 g/dL — ABNORMAL LOW (ref 13.0–17.0)
MCH: 28.9 pg (ref 26.0–34.0)
MCHC: 31.3 g/dL (ref 30.0–36.0)
MCV: 92.1 fL (ref 78.0–100.0)
Platelets: 279 10*3/uL (ref 150–400)
RBC: 2.91 MIL/uL — ABNORMAL LOW (ref 4.22–5.81)
RDW: 18.4 % — AB (ref 11.5–15.5)
WBC: 8.6 10*3/uL (ref 4.0–10.5)

## 2016-07-18 LAB — BASIC METABOLIC PANEL
Anion gap: 7 (ref 5–15)
BUN: 12 mg/dL (ref 6–20)
CALCIUM: 8 mg/dL — AB (ref 8.9–10.3)
CHLORIDE: 106 mmol/L (ref 101–111)
CO2: 35 mmol/L — ABNORMAL HIGH (ref 22–32)
CREATININE: 0.68 mg/dL (ref 0.61–1.24)
GFR calc Af Amer: >60 mL/min (ref >60)
Glucose, Bld: 110 mg/dL — ABNORMAL HIGH (ref 65–99)
Potassium: 3.2 mmol/L — ABNORMAL LOW (ref 3.5–5.1)
SODIUM: 148 mmol/L — AB (ref 135–145)

## 2016-07-18 LAB — MAGNESIUM: MAGNESIUM: 1.8 mg/dL (ref 1.7–2.4)

## 2016-07-18 MED ORDER — FREE WATER
400.0000 mL | Freq: Four times a day (QID) | Status: DC
  Administered 2016-07-18 – 2016-07-27 (×33): 400 mL

## 2016-07-18 MED ORDER — POTASSIUM CHLORIDE 20 MEQ/15ML (10%) PO SOLN
40.0000 meq | ORAL | Status: AC
  Administered 2016-07-18 (×2): 40 meq via ORAL
  Filled 2016-07-18 (×2): qty 30

## 2016-07-18 NOTE — Progress Notes (Signed)
RT note: RT attempted cpap/PS wean times two. Patient unable to wean either time do to apnea.

## 2016-07-18 NOTE — Progress Notes (Signed)
PULMONARY / CRITICAL CARE MEDICINE   Name: Dale [name] MRN: [id number] DOB: 07/16/1966    ADMISSION DATE:  07/12/2016 CONSULTATION DATE:  07/12/16  REFERRING MD:  APH  CHIEF COMPLAINT:  AMS  SUBJECTIVE:   Able to nod to questions and follow simple commands  VITAL SIGNS: BP (!) 88/64   Pulse (!) 59   Temp 97.9 F (36.6 C) (Oral)   Resp 14   Ht 5\' 8"  (1.727 m)   Wt 65.5 kg (144 lb 6.4 oz)   SpO2 100%   BMI 21.96 kg/m   HEMODYNAMICS: CVP:  [8 mmHg] 8 mmHg  VENTILATOR SETTINGS: Vent Mode: PRVC FiO2 (%):  [30 %] 30 % Set Rate:  [14 bmp] 14 bmp Vt Set:  [550 mL] 550 mL PEEP:  [5 cmH20] 5 cmH20 Pressure Support:  [5 cmH20] 5 cmH20 Plateau Pressure:  [14 cmH20-18 cmH20] 18 cmH20  INTAKE / OUTPUT: I/O last 3 completed shifts: In: 4456.5 [I.V.:726.5; NG/GT:3230; IV Piggyback:500] Out: 2275 [Urine:2075; Stool:200]  PHYSICAL EXAMINATION: General: ill appearing Neuro: RASS 0, moves extremities, follows simple commands HEENT: ETT in place Cardiac: regular, bradycardic Chest: scattered rhonchi Abd: soft, non tender Ext: 1+ edema Skin: sacral pressure ulcer  LABS:  BMET  Recent Labs Lab 07/16/16 0400 07/17/16 0445 07/18/16 0500  NA 148* 148* 148*  K 3.7 3.0* 3.2*  CL 112* 104 106  CO2 30 32 35*  BUN 14 11 12   CREATININE 0.74 0.64 0.68  GLUCOSE 108* 113* 110*    Electrolytes  Recent Labs Lab 07/14/16 0330 07/14/16 1600  07/16/16 0400 07/17/16 0445 07/18/16 0500  CALCIUM 7.3*  --   < > 7.8* 7.7* 8.0*  MG 2.0 1.7  --  2.1  --  1.8  PHOS 3.0 2.1*  --  2.8  --   --   < > = values in this interval not displayed.  CBC  Recent Labs Lab 07/16/16 0400 07/17/16 0445 07/18/16 0500  WBC 12.2* 8.1 8.6  HGB 9.8* 9.2* 8.4*  HCT 31.6* 30.0* 26.8*  PLT 237 227 279    Coag's  Recent Labs Lab 07/15/16 1300  APTT 36  INR 1.00    Sepsis Markers  Recent Labs Lab 07/12/16 1345 07/13/16 1024 07/13/16 1030 07/14/16 0330 07/15/16 0400   LATICACIDVEN 2.8* 1.6  --   --   --   PROCALCITON  --   --  0.10 <0.10 <0.10    ABG  Recent Labs Lab 07/12/16 2359 07/13/16 0440 07/14/16 1345  PHART 7.511* 7.427 7.404  PCO2ART 34.0* 42.2 40.6  PO2ART 155.0* 161* 161*    Liver Enzymes  Recent Labs Lab 07/12/16 1345  AST 12*  ALT 10*  ALKPHOS 47  BILITOT 0.3  ALBUMIN 2.4*    Cardiac Enzymes No results for input(s): TROPONINI, PROBNP in the last 168 hours.  Glucose  Recent Labs Lab 07/17/16 1106 07/17/16 1617 07/17/16 2036 07/18/16 0010 07/18/16 0419 07/18/16 0834  GLUCAP 99 89 100* 105* 113* 108*    Imaging Dg Chest Port 1 View  Result Date: 07/18/2016 CLINICAL DATA:  Intubation. EXAM: PORTABLE CHEST 1 VIEW COMPARISON:  07/17/2016 . FINDINGS: Endotracheal tube, NG tube, right IJ line in stable position. Bibasilar atelectasis and/or infiltrates again noted. Small bilateral pleural effusions. Chest is stable from prior exam. No pneumothorax . IMPRESSION: 1. Lines and tubes in stable position. 2. Stable bibasilar atelectasis and/or infiltrates. Stable small bilateral pleural effusions. Chest is unchanged from prior study of 07/17/2016. Electronically Signed  ByMaisie Fus: Thomas  Register   On: 07/18/2016 06:49    STUDIES:  CT head 8/8 >> old b/l infarcts with subacute infarct again identified at inferior left caudate.  No acute process. EEG 8/10 >>suppressed backround MRI brain 8/11 >> multifocal acute Lt MCA ischemic infarcts, possible infarct Rt caudate head  CULTURES: 8/08 Urine >> Enterobacter 8/08 Blood >>  8/09 Sputum >> Enterobacter 8/09 Blood >>  8/12 C diff >> antigen and toxin positive  ANTIBIOTICS: Vanc 8/8 >>8/10 Zosyn 8/8 >>8/11 Primaxin 8/11>> Oral vancomycin 8/12 >>  SIGNIFICANT EVENTS: 8/08admitted with unresponsiveness, required intubation. 8/09 low cvp, levophed, possible seizure  8/10 not moving rt ext upper and lowers 8/13 Following commands  LINES/TUBES: ETT 8/8 > R IJ CVL 8/8  >  DISCUSSION: 50 yo male from NH with altered mental status, inability to protect airway.  He was hypotensive and hypothermic from UTI.  He has PMHx of combined CHF, Streptococcal meningitis, ETOH, Depression, Seizures, Dementia, CVA.  ASSESSMENT / PLAN:  PULMONARY A: Acute hypoxic respiratory failure - due to inability to protect airway. 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 P:   Pressure support wean as tolerated F/u CXR Will need to d/w pt's wife about whether he is candidate for trach again  CARDIOVASCULAR A:  Septic shock from UTI, HCAP, C diff colitis. Chronic combined CHF, HLD. P:  Wean pressors to keep MAP > 65 Continue ASA, lipitor  RENAL A:   Hypernatremia. Hypokalemia. P:   F/u BMET Replete electrolytes as indicated Free water > increase on 8/14  GASTROINTESTINAL A:   Nutrition. P:   Tube feeds Pepcid for SUP  HEMATOLOGIC A:   Anemia of chronic disease and critical illness. P:  F/u CBC SQ heparin for DVT prophylaxis  INFECTIOUS A:   UTI, HCAP with Enterobacter >> MDR. C diff colitis. P:   Continue primaxin Continue enteral vancomycin  ENDOCRINE A:   Chronic adrenal insufficiency. P:   Florinef, solu cortef  NEUROLOGIC A:   Acute encephalopathy. Hx seizures, strep pneumo meningitis, CVA. P:   Monitor mental status Continue depakote, keppra   FAMILY Dr Delton CoombesByrum attempted to call pt's wife (separated) at all 3 numbers, unable to get her. Will need to try again - clearly need to discuss goals for pt's care, would he want / benefit from continued critical care interventions, should we pursue repeat trach, etc.   CC time 35 minutes.  Levy Pupaobert Byrum, MD, PhD 07/18/2016, 10:26 AM [place] [facility] [phone] or if no answer [phone]

## 2016-07-18 NOTE — Progress Notes (Signed)
Pt intermittently restless, trying to sit up and all attempts to reorient pt. futile. Pt popped off vent multiple times. Pt bolused with fentanyl 50 mcg x 3 during the shift.  Will cont to monitor closely.

## 2016-07-18 NOTE — Progress Notes (Signed)
Pharmacy Antibiotic Note  Dale [name] is a 50 y.o. male admitted from [facility] on 07/12/2016 with UTI sepsis requiring norepinephrine.  Pharmacy has been consulted for Merrem. Noted pt just d/c from hospital 8/7 after 3 wk long hospitalization and recent strep pneumo meningitis. Both urine and sputum culture with enterobacter - resistant to Zosyn. CBC wnl, AF, renal function stable.  Plan: Merrem 1g every 8 hours LOT plans  Height: 5\' 8"  (172.7 cm) Weight: 144 lb 6.4 oz (65.5 kg) IBW/kg (Calculated) : 68.4  Temp (24hrs), Avg:97 F (36.1 C), Min:95.2 F (35.1 C), Max:99 F (37.2 C)   Recent Labs Lab 07/12/16 1345  07/13/16 1024  07/14/16 0330 07/15/16 0400 07/16/16 0400 07/17/16 0445 07/18/16 0500  WBC 12.0*  < >  --   --  9.4 12.0* 12.2* 8.1 8.6  CREATININE 1.06  < >  --   < > 1.01 0.79 0.74 0.64 0.68  LATICACIDVEN 2.8*  --  1.6  --   --   --   --   --   --   < > = values in this interval not displayed.  Estimated Creatinine Clearance: 101.2 mL/min (by C-G formula based on SCr of 0.8 mg/dL).    No Known Allergies  Antimicrobials this admission: 8/9 Zosyn>>8/11 8/9 vanc>8/10 8/11 Imipenem> then switched to Merrem due to history of seizures (no dose given) 8/11 Merrem >  Microbiology results: 8/9 BCx x2: 8/8 BCx x2:  8/8 UCx: enterobacter cloacae - zosyn resistent  Sputum:  enterobacter 8/8 MRSA PCR:   Dale [name], PharmD Clinical Pharmacy Resident Pager: [phone] 07/18/16 9:58 AM

## 2016-07-19 DIAGNOSIS — A047 Enterocolitis due to Clostridium difficile: Secondary | ICD-10-CM

## 2016-07-19 DIAGNOSIS — N390 Urinary tract infection, site not specified: Secondary | ICD-10-CM

## 2016-07-19 LAB — GLUCOSE, CAPILLARY
GLUCOSE-CAPILLARY: 49 mg/dL — AB (ref 65–99)
GLUCOSE-CAPILLARY: 99 mg/dL (ref 65–99)
GLUCOSE-CAPILLARY: 130 mg/dL — AB (ref 65–99)
GLUCOSE-CAPILLARY: 119 mg/dL — AB (ref 65–99)
Glucose-Capillary: 81 mg/dL (ref 65–99)
Glucose-Capillary: 113 mg/dL — ABNORMAL HIGH (ref 65–99)
Glucose-Capillary: 98 mg/dL (ref 65–99)

## 2016-07-19 LAB — BASIC METABOLIC PANEL
Anion gap: 6 (ref 5–15)
Anion gap: 4 — ABNORMAL LOW (ref 5–15)
BUN: 13 mg/dL (ref 6–20)
BUN: 12 mg/dL (ref 6–20)
CALCIUM: 8 mg/dL — AB (ref 8.9–10.3)
CALCIUM: 8.2 mg/dL — AB (ref 8.9–10.3)
CHLORIDE: 108 mmol/L (ref 101–111)
CO2: 35 mmol/L — ABNORMAL HIGH (ref 22–32)
CO2: 34 mmol/L — AB (ref 22–32)
CREATININE: 0.69 mg/dL (ref 0.61–1.24)
CREATININE: 0.67 mg/dL (ref 0.61–1.24)
Chloride: 106 mmol/L (ref 101–111)
GFR calc Af Amer: >60 mL/min (ref >60)
GFR calc non Af Amer: >60 mL/min (ref >60)
GLUCOSE: 195 mg/dL — AB (ref 65–99)
GLUCOSE: 95 mg/dL (ref 65–99)
POTASSIUM: 2.8 mmol/L — AB (ref 3.5–5.1)
Potassium: 3.8 mmol/L (ref 3.5–5.1)
SODIUM: 147 mmol/L — AB (ref 135–145)
Sodium: 146 mmol/L — ABNORMAL HIGH (ref 135–145)

## 2016-07-19 LAB — CBC
HCT: 27.6 % — ABNORMAL LOW (ref 39.0–52.0)
Hemoglobin: 8.6 g/dL — ABNORMAL LOW (ref 13.0–17.0)
MCH: 28.6 pg (ref 26.0–34.0)
MCHC: 31.2 g/dL (ref 30.0–36.0)
MCV: 91.7 fL (ref 78.0–100.0)
PLATELETS: 268 10*3/uL (ref 150–400)
RBC: 3.01 MIL/uL — ABNORMAL LOW (ref 4.22–5.81)
RDW: 18.4 % — AB (ref 11.5–15.5)
WBC: 8.8 10*3/uL (ref 4.0–10.5)

## 2016-07-19 MED ORDER — DEXTROSE 50 % IV SOLN
INTRAVENOUS | Status: AC
  Administered 2016-07-19: 25 mL
  Filled 2016-07-19: qty 50

## 2016-07-19 MED ORDER — VITAL AF 1.2 CAL PO LIQD
1000.0000 mL | ORAL | Status: DC
Start: 2016-07-19 — End: 2016-07-27
  Administered 2016-07-20 – 2016-07-27 (×9): 1000 mL

## 2016-07-19 MED ORDER — POTASSIUM CHLORIDE 20 MEQ/15ML (10%) PO SOLN
40.0000 meq | ORAL | Status: AC
  Administered 2016-07-19 (×2): 40 meq
  Filled 2016-07-19: qty 30

## 2016-07-19 MED ORDER — QUETIAPINE FUMARATE 100 MG PO TABS
100.0000 mg | ORAL_TABLET | Freq: Two times a day (BID) | ORAL | Status: DC
Start: 2016-07-19 — End: 2016-07-21
  Administered 2016-07-19 – 2016-07-20 (×4): 100 mg
  Filled 2016-07-19 (×4): qty 1

## 2016-07-19 MED ORDER — POTASSIUM CHLORIDE 20 MEQ/15ML (10%) PO SOLN
ORAL | Status: AC
  Filled 2016-07-19: qty 30

## 2016-07-19 MED ORDER — POTASSIUM CHLORIDE 20 MEQ PO PACK
40.0000 meq | PACK | ORAL | Status: AC
  Administered 2016-07-19 (×2): 40 meq via ORAL
  Filled 2016-07-19 (×2): qty 2

## 2016-07-19 MED ORDER — QUETIAPINE FUMARATE 100 MG PO TABS: 100.0000 mg | ORAL_TABLET | Freq: Two times a day (BID) | ORAL | Status: DC

## 2016-07-19 NOTE — Progress Notes (Signed)
PULMONARY / CRITICAL CARE MEDICINE   Name: Dale [name] MRN: [id number] DOB: 01/13/1966    ADMISSION DATE:  07/12/2016 CONSULTATION DATE:  07/12/16  REFERRING MD:  APH  CHIEF COMPLAINT:  AMS  SUBJECTIVE:   Able to nod to questions and follow simple commands. He has been agitated through the  night and this am requiring increase in sedation.  VITAL SIGNS: BP 101/71 (BP Location: Right Arm)   Pulse 65   Temp 97.6 F (36.4 C) (Axillary)   Resp 20   Ht 5\' 8"  (1.727 m)   Wt 143 lb 1.3 oz (64.9 kg)   SpO2 93%   BMI 21.76 kg/m   HEMODYNAMICS:    VENTILATOR SETTINGS: Vent Mode: PRVC FiO2 (%):  [30 %] 30 % Set Rate:  [14 bmp] 14 bmp Vt Set:  [550 mL] 550 mL PEEP:  [5 cmH20] 5 cmH20 Pressure Support:  [10 cmH20] 10 cmH20 Plateau Pressure:  [11 cmH20-20 cmH20] 11 cmH20  INTAKE / OUTPUT: I/O last 3 completed shifts: In: 4508.2 [I.V.:703.2; NG/GT:3305; IV Piggyback:500] Out: 3425 [Urine:2475; Emesis/NG output:500; Stool:450]  PHYSICAL EXAMINATION: General: ill appearing, intubated on vent Neuro: RASS 0, moves extremities, follows simple commands, agitated HEENT: ETT in place Cardiac: regular, bradycardic Chest: scattered rhonchi Abd: soft, non tender Ext: 1+ edema UE, 2+ LLE Skin: sacral pressure ulcer  LABS:  BMET  Recent Labs Lab 07/17/16 0445 07/18/16 0500 07/19/16 0446  NA 148* 148* 147*  K 3.0* 3.2* 2.8*  CL 104 106 106  CO2 32 35* 35*  BUN 11 12 13   CREATININE 0.64 0.68 0.69  GLUCOSE 113* 110* 195*    Electrolytes  Recent Labs Lab 07/14/16 0330 07/14/16 1600  07/16/16 0400 07/17/16 0445 07/18/16 0500 07/19/16 0446  CALCIUM 7.3*  --   < > 7.8* 7.7* 8.0* 8.0*  MG 2.0 1.7  --  2.1  --  1.8  --   PHOS 3.0 2.1*  --  2.8  --   --   --   < > = values in this interval not displayed.  CBC  Recent Labs Lab 07/17/16 0445 07/18/16 0500 07/19/16 0446  WBC 8.1 8.6 8.8  HGB 9.2* 8.4* 8.6*  HCT 30.0* 26.8* 27.6*  PLT 227 279 268     Coag's  Recent Labs Lab 07/15/16 1300  APTT 36  INR 1.00    Sepsis Markers  Recent Labs Lab 07/12/16 1345 07/13/16 1024 07/13/16 1030 07/14/16 0330 07/15/16 0400  LATICACIDVEN 2.8* 1.6  --   --   --   PROCALCITON  --   --  0.10 <0.10 <0.10    ABG  Recent Labs Lab 07/12/16 2359 07/13/16 0440 07/14/16 1345  PHART 7.511* 7.427 7.404  PCO2ART 34.0* 42.2 40.6  PO2ART 155.0* 161* 161*    Liver Enzymes  Recent Labs Lab 07/12/16 1345  AST 12*  ALT 10*  ALKPHOS 47  BILITOT 0.3  ALBUMIN 2.4*    Cardiac Enzymes No results for input(s): TROPONINI, PROBNP in the last 168 hours.  Glucose  Recent Labs Lab 07/18/16 1533 07/18/16 1959 07/18/16 2341 07/19/16 0407 07/19/16 0513 07/19/16 0751  GLUCAP 105* 125* 77 49* 99 81    Imaging No results found.  STUDIES:  CT head 8/8 >> old b/l infarcts with subacute infarct again identified at inferior left caudate.  No acute process. EEG 8/10 >>suppressed backround MRI brain 8/11 >> multifocal acute Lt MCA ischemic infarcts, possible infarct Rt caudate head  CULTURES: 8/08 Urine >> Enterobacter 8/08  Blood >> No Growth x 5 days 8/09 Sputum >> Enterobacter 8/09 Blood >> No Growth x 5 days 8/12 C diff >> antigen and toxin positive  ANTIBIOTICS: Vanc 8/8 >>8/10 Zosyn 8/8 >>8/11 Primaxin 8/11>> Oral vancomycin 8/12 >>  SIGNIFICANT EVENTS: 8/08admitted with unresponsiveness, required intubation. 8/09 low cvp, levophed, possible seizure  8/10 not moving rt ext upper and lowers 8/13 Following commands 8/15>> Following Commands, but agitated., Norepi off 8/15 am.  LINES/TUBES: ETT 8/8 > R IJ CVL 8/8 >  DISCUSSION: 50 yo male from NH with altered mental status, inability to protect airway.  He was hypotensive and hypothermic from UTI.  He has PMHx of combined CHF, Streptococcal meningitis, ETOH, Depression, Seizures, Dementia, CVA.  ASSESSMENT / PLAN:  PULMONARY A: Acute hypoxic respiratory  failure - due to inability to protect airway. 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 8/15 CXR : Stable bibasilar atelectasis and/or infiltrates. Stable small bilateral pleural effusions, unchanged from 8/13 Failed SBT with low TV.  P:   Pressure support wean as tolerated F/u CXR Will need to d/w pt's wife about goals of care whether he is candidate for trach again, vs comfort care and withdrawal.  CARDIOVASCULAR A:  Septic shock from UTI, HCAP, C diff colitis. Chronic combined CHF, HLD. Norepinephrine off 8/15 am. P:  MAP goal >65 Continue ASA, lipitor 1+ edema upper extremities  RENAL A:   Hypernatremia. Hypokalemia.  P:   F/u BMET Replete electrolytes as indicated Continue Free water > increase on 8/14  GASTROINTESTINAL A:   Nutrition. Tolerating TF at goal P:   Tube feeds  Pepcid for SUP  HEMATOLOGIC A:   Anemia of chronic disease and critical illness. No obvious signs of bleeding P:  F/u CBC SQ heparin for DVT prophylaxis  INFECTIOUS A:   UTI, HCAP with Enterobacter >> MDR. C diff colitis. P:   Continue primaxin Continue enteral vancomycin Trend fever Trend WBC  ENDOCRINE A:   Chronic adrenal insufficiency. Hypoglycemia per CBG's, not BMET  P:   Florinef, solu cortef CBG's q 4, draw from CVC ( pt is edematous)  NEUROLOGIC A:   Acute encephalopathy. Hx seizures, strep pneumo meningitis, CVA. Agitated requiring increased sedation. P:   Monitor mental status Continue depakote, keppra  Will add Seroquel 100 mg BID  FAMILY Dr Delton Coombes attempted to call pt's wife (separated) at all 3 numbers, unable to get her on 8/14.  I spoke at length with Dale [name], Dale [name]'s wife  this morning. I explained that  We  need to discuss goals for pt's care, would he want / benefit from continued critical care interventions, should we pursue repeat trach, etc. She stated that she is not ready to make a decision regarding withdrawal at present. Her son is  against withdrawal and she feels it is best to " leave it in God's hands". I discussed that Dale [name]'s quality of life is poor and that often times our interventions are painful. She is willing to come to the hospital this Thursday 07/21/16 for a family conference. I have coordinated with Dr. Delton Coombes, and they will meet at 10 am on the unit.    Dale [name], AGACNP-BC [facility] 07/19/2016, 9:12 AM [facility] [phone]   Attending Note:  I have examined patient, reviewed labs, studies and notes. I have discussed the case with Saralyn Pilar, and I agree with the data and plans as amended above.  50 yo man, hx encephalopathy and dementia since admission for  pneumococcal meningitis. Also with hx associated seizures. Admitted with altered MS and VDRF in setting UTI. Course c/b C diff colitis, being treated. On eval this am he has been agitated, is now sedated on fentanyl. Has received some versed. Lungs clear B. He was apneic when I changed him to PSV. Will plan to continue current imipenem to treat MDR GN UTI, could consider cipro as alternative if we feel imipenem is barrier to resolving c diff. Continue vanco enteral. add seroquel and continue seizure regimen. Will plan for a family meeting this week to determine course of his care.  Independent critical care time is 35 minutes.   Levy Pupaobert Emmalena Canny, MD, PhD 07/19/2016, 10:29 AM [facility] [phone] or if no answer [phone]

## 2016-07-19 NOTE — Progress Notes (Signed)
[facility] REPLAC[facility] ONLY  The [facility] does [facility] based on the criteria listed below:   1. Is GFR >/= 40 ml/min? Yes.    Patient's GFR today is >60 2. Is urine output >/= 0.5 ml/kg/hr for the last 6 hours? Yes.   Patient's UOP is 4.5 ml/kg/hr 3. Is BUN < 60 mg/dL? Yes.    Patient's BUN today is 13 4. Abnormal electrolyte(s): K2.8 5. Ordered repletion with: per protocol 6. If a panic level lab has been reported, has the CCM MD in charge been notified? Yes.  .   Physician:  S Sommer,MD  Dale NakayamaChisholm, Dale [name] 07/19/2016 6:23 AM

## 2016-07-19 NOTE — Progress Notes (Signed)
Nutrition Follow-up  DOCUMENTATION CODES:   Severe malnutrition in context of chronic illness  INTERVENTION:   Increase Vital AF 1.2 to 60 ml/hr Provides: 1440 ml, 1728 kcal, 108 grams protein, and 1167 ml H2O. Total free water: 2767 ml  NUTRITION DIAGNOSIS:   Malnutrition (Severe) related to chronic illness as evidenced by severe depletion of body fat, severe depletion of muscle mass. Ongoing.   GOAL:   Patient will meet greater than or equal to 90% of their needs Met.   MONITOR:   TF tolerance, Skin, I & O's, Vent status, Labs, Weight trends  ASSESSMENT:   50 y.o. M with complex medical history and multiple recent admissions (strep pneumo meningitis, cardiac arrest, cardiogenic shock, ARDS, CVA, seizures) resides in nursing home, admitted again 8/8 with unresponsiveness and inability to protect airway, intubated in ED.  Hypotensive and hypothermic but has this chronically due to adrenal insufficiency.   Failed weaning trials, MD to discuss goals of care with wife.   Patient is currently intubated on ventilator support MV: 7.4 L/min Temp (24hrs), Avg:98.2 F (36.8 C), Min:96.6 F (35.9 C), Max:99.5 F (37.5 C)  Medications reviewed and include: folvite, solu-cortef, thiamine  Labs reviewed: Na 147, K+2.8 CBG's: 81-130 Free water 400 ml every 6 hours = 1600 ml  Diet Order:    NPO  Skin:  Wound (see comment) (stage II on sacrum and scrotum)  Last BM:  8/14 450 ml stool via rectal pouch  Height:   Ht Readings from Last 1 Encounters:  07/16/16 '5\' 8"'$  (1.727 m)    Weight:   Wt Readings from Last 1 Encounters:  07/19/16 143 lb 1.3 oz (64.9 kg)    Ideal Body Weight:  70 kg  BMI:  Body mass index is 21.76 kg/m.  Estimated Nutritional Needs:   Kcal:  8676  Protein:  90-115 grams  Fluid:  > 1.9 L/day  EDUCATION NEEDS:   No education needs identified at this time  [place], [place], [place] Pager [phone] After Hours Pager

## 2016-07-19 NOTE — Progress Notes (Signed)
Pt observed with ETT nearly out (18cm), RT notified. Airway advanced to 26cm by RT, Dr. Delton CoombesByrum notified and new order received to increase fent to 150 mcg. Pt also given versed 2mg , will cont to monitor.

## 2016-07-20 LAB — CBC WITH DIFFERENTIAL/PLATELET
BASOS ABS: 0 10*3/uL (ref 0.0–0.1)
Basophils Relative: 0 %
Eosinophils Absolute: 0 10*3/uL (ref 0.0–0.7)
Eosinophils Relative: 0 %
HEMATOCRIT: 26.5 % — AB (ref 39.0–52.0)
Hemoglobin: 8.3 g/dL — ABNORMAL LOW (ref 13.0–17.0)
LYMPHS PCT: 20 %
Lymphs Abs: 2.2 10*3/uL (ref 0.7–4.0)
MCH: 29.2 pg (ref 26.0–34.0)
MCHC: 31.3 g/dL (ref 30.0–36.0)
MCV: 93.3 fL (ref 78.0–100.0)
Monocytes Absolute: 0.7 10*3/uL (ref 0.1–1.0)
Monocytes Relative: 7 %
NEUTROS ABS: 7.9 10*3/uL — AB (ref 1.7–7.7)
Neutrophils Relative %: 73 %
Platelets: 275 10*3/uL (ref 150–400)
RBC: 2.84 MIL/uL — AB (ref 4.22–5.81)
RDW: 18.8 % — ABNORMAL HIGH (ref 11.5–15.5)
WBC: 10.8 10*3/uL — AB (ref 4.0–10.5)

## 2016-07-20 LAB — BASIC METABOLIC PANEL
Anion gap: 5 (ref 5–15)
BUN: 15 mg/dL (ref 6–20)
CO2: 33 mmol/L — ABNORMAL HIGH (ref 22–32)
Calcium: 8.2 mg/dL — ABNORMAL LOW (ref 8.9–10.3)
Chloride: 109 mmol/L (ref 101–111)
Creatinine, Ser: 0.74 mg/dL (ref 0.61–1.24)
GFR calc Af Amer: >60 mL/min (ref >60)
Glucose, Bld: 110 mg/dL — ABNORMAL HIGH (ref 65–99)
POTASSIUM: 3.4 mmol/L — AB (ref 3.5–5.1)
Sodium: 147 mmol/L — ABNORMAL HIGH (ref 135–145)

## 2016-07-20 LAB — PHOSPHORUS: Phosphorus: 3.2 mg/dL (ref 2.5–4.6)

## 2016-07-20 LAB — GLUCOSE, CAPILLARY
GLUCOSE-CAPILLARY: 106 mg/dL — AB (ref 65–99)
GLUCOSE-CAPILLARY: 87 mg/dL (ref 65–99)
GLUCOSE-CAPILLARY: 88 mg/dL (ref 65–99)
GLUCOSE-CAPILLARY: 107 mg/dL — AB (ref 65–99)
Glucose-Capillary: 112 mg/dL — ABNORMAL HIGH (ref 65–99)

## 2016-07-20 LAB — MAGNESIUM: Magnesium: 1.8 mg/dL (ref 1.7–2.4)

## 2016-07-20 MED ORDER — HYDROCORTISONE NA SUCCINATE PF 100 MG IJ SOLR
50.0000 mg | Freq: Two times a day (BID) | INTRAMUSCULAR | Status: DC
  Administered 2016-07-20 – 2016-07-22 (×4): 50 mg via INTRAVENOUS
  Filled 2016-07-20 (×4): qty 2

## 2016-07-20 MED ORDER — MAGNESIUM SULFATE 2 GM/50ML IV SOLN
2.0000 g | Freq: Once | INTRAVENOUS | Status: AC
  Administered 2016-07-20: 2 g via INTRAVENOUS
  Filled 2016-07-20: qty 50

## 2016-07-20 MED ORDER — SODIUM CHLORIDE 0.9% FLUSH: 10.0000 mL | INTRAVENOUS | Status: DC | PRN

## 2016-07-20 MED ORDER — SODIUM CHLORIDE 0.9% FLUSH
10.0000 mL | Freq: Two times a day (BID) | INTRAVENOUS | Status: DC
  Administered 2016-07-20: 30 mL
  Administered 2016-07-20: 10 mL
  Administered 2016-07-21: 20 mL
  Administered 2016-07-21 – 2016-07-23 (×4): 10 mL
  Administered 2016-07-24: 20 mL
  Administered 2016-07-24 – 2016-07-25 (×3): 10 mL
  Administered 2016-07-26: 30 mL
  Administered 2016-07-26 – 2016-07-27 (×2): 10 mL
  Administered 2016-07-27 – 2016-07-28 (×2): 30 mL
  Administered 2016-07-29: 10 mL
  Administered 2016-07-30: 16 mL

## 2016-07-20 MED ORDER — POTASSIUM CHLORIDE 20 MEQ/15ML (10%) PO SOLN
40.0000 meq | Freq: Two times a day (BID) | ORAL | Status: AC
  Administered 2016-07-20 (×2): 40 meq
  Filled 2016-07-20: qty 30

## 2016-07-20 MED ORDER — POTASSIUM CHLORIDE 20 MEQ/15ML (10%) PO SOLN
20.0000 meq | ORAL | Status: DC
  Administered 2016-07-20: 20 meq
  Filled 2016-07-20 (×2): qty 15

## 2016-07-20 MED ORDER — POTASSIUM CHLORIDE 20 MEQ/15ML (10%) PO SOLN
ORAL | Status: AC
  Administered 2016-07-20: 20 meq
  Filled 2016-07-20: qty 30

## 2016-07-20 NOTE — Progress Notes (Signed)
[facility] AM LAB REPLACEMENT ONLY  The [facility] based on the criteria listed below:   1. Is GFR >/= 40 ml/min? Yes.    Patient's GFR today is >60 2. Is urine output >/= 0.5 ml/kg/hr for the last 6 hours? Yes.   Patient's UOP is 1.09 ml/kg/hr 3. Is BUN < 60 mg/dL? Yes.    Patient's BUN today is 15 4. Abnormal electrolyte(s):K3.4, Mg 1.8 5. Ordered repletion with: Per protocol 6. If a panic level lab has been reported, has the CCM MD in charge been notified? Yes.  .   Physician:  Laural BenesS Sommer, MD  [place] NakayamaChisholm, Jaaron Oleson William 07/20/2016 5:09 AM

## 2016-07-20 NOTE — Progress Notes (Signed)
PULMONARY / CRITICAL CARE MEDICINE   Name: Dale [name] MRN: [id number] DOB: 03/31/1966    ADMISSION DATE:  07/12/2016 CONSULTATION DATE:  07/12/16  REFERRING MD:  APH  CHIEF COMPLAINT:  AMS  SUBJECTIVE:   No sig change.  Easily agitated when sedation weaned.    VITAL SIGNS: BP 119/73   Pulse (!) 56   Temp 97 F (36.1 C) (Axillary)   Resp 14   Ht 5\' 8"  (1.727 m)   Wt 65 kg (143 lb 4.8 oz)   SpO2 99%   BMI 21.79 kg/m   HEMODYNAMICS:    VENTILATOR SETTINGS: Vent Mode: PRVC FiO2 (%):  [30 %] 30 % Set Rate:  [14 bmp] 14 bmp Vt Set:  [550 mL] 550 mL PEEP:  [5 cmH20] 5 cmH20 Plateau Pressure:  [12 cmH20-17 cmH20] 17 cmH20  INTAKE / OUTPUT: I/O last 3 completed shifts: In: 4650.8 [I.V.:782.9; NG/GT:3317.9; IV Piggyback:550] Out: 6375 [Urine:4825; Emesis/NG output:500; Stool:1050]  PHYSICAL EXAMINATION: General: ill appearing, intubated on vent Neuro: RASS +1, squirming in bed, kicking legs at times, follows simple commands per RN but did not for me HEENT: mm moist, no JVD, ETT in place Cardiac: regular, bradycardic Chest: resps even non labored on vent, scattered rhonchi Abd: soft, non tender Ext: 1+ edema UE, 2+ LLE Skin: sacral pressure ulcer  LABS:  BMET  Recent Labs Lab 07/19/16 0446 07/19/16 2026 07/20/16 0313  NA 147* 146* 147*  K 2.8* 3.8 3.4*  CL 106 108 109  CO2 35* 34* 33*  BUN 13 12 15   CREATININE 0.69 0.67 0.74  GLUCOSE 195* 95 110*    Electrolytes  Recent Labs Lab 07/14/16 1600  07/16/16 0400  07/18/16 0500 07/19/16 0446 07/19/16 2026 07/20/16 0313  CALCIUM  --   < > 7.8*  < > 8.0* 8.0* 8.2* 8.2*  MG 1.7  --  2.1  --  1.8  --   --  1.8  PHOS 2.1*  --  2.8  --   --   --   --  3.2  < > = values in this interval not displayed.  CBC  Recent Labs Lab 07/18/16 0500 07/19/16 0446 07/20/16 0313  WBC 8.6 8.8 10.8*  HGB 8.4* 8.6* 8.3*  HCT 26.8* 27.6* 26.5*  PLT 279 268 275    Coag's  Recent Labs Lab 07/15/16 1300  APTT  36  INR 1.00    Sepsis Markers  Recent Labs Lab 07/13/16 1024 07/13/16 1030 07/14/16 0330 07/15/16 0400  LATICACIDVEN 1.6  --   --   --   PROCALCITON  --  0.10 <0.10 <0.10    ABG  Recent Labs Lab 07/14/16 1345  PHART 7.404  PCO2ART 40.6  PO2ART 161*    Liver Enzymes No results for input(s): AST, ALT, ALKPHOS, BILITOT, ALBUMIN in the last 168 hours.  Cardiac Enzymes No results for input(s): TROPONINI, PROBNP in the last 168 hours.  Glucose  Recent Labs Lab 07/19/16 1119 07/19/16 1534 07/19/16 2005 07/19/16 2329 07/20/16 0303 07/20/16 0920  GLUCAP 113* 130* 98 119* 112* 106*    Imaging No results found.  STUDIES:  CT head 8/8 >> old b/l infarcts with subacute infarct again identified at inferior left caudate.  No acute process. EEG 8/10 >>suppressed backround MRI brain 8/11 >> multifocal acute Lt MCA ischemic infarcts, possible infarct Rt caudate head  CULTURES: 8/08 Urine >> Enterobacter 8/08 Blood >> No Growth x 5 days 8/09 Sputum >> Enterobacter 8/09 Blood >> No Growth x  5 days 8/12 C diff >> antigen and toxin positive  ANTIBIOTICS: Vanc 8/8 >>8/10 Zosyn 8/8 >>8/11 Primaxin 8/11>> Oral vancomycin 8/12 >>  SIGNIFICANT EVENTS: 8/08admitted with unresponsiveness, required intubation. 8/09 low cvp, levophed, possible seizure  8/10 not moving rt ext upper and lowers 8/13 Following commands 8/15>> Following Commands, but agitated., Norepi off 8/15 am.  LINES/TUBES: ETT 8/8 > R IJ CVL 8/8 >  DISCUSSION: 50 yo male from NH with frequent admissions admitted again 8/18 with altered mental status, inability to protect airway.  He was hypotensive and hypothermic from UTI.  He has PMHx of combined CHF, Streptococcal meningitis, ETOH, Depression, Seizures, Dementia, CVA.  ASSESSMENT / PLAN:  PULMONARY A: Acute hypoxic respiratory failure - due to inability to protect airway. 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 8/15 CXR : Stable  bibasilar atelectasis and/or infiltrates. Stable small bilateral pleural effusions, unchanged from 8/13 Failed SBT with low TV.  P:   Vent support - 8cc/kg  F/u CXR  F/u ABG  Pressure support wean as tolerated Will need to d/w pt's wife about goals of care whether he is candidate for repeat trach vs comfort care and withdrawal.  CARDIOVASCULAR A:  Septic shock from UTI, HCAP, C diff colitis - resolved.  Chronic combined CHF, HLD. P:  MAP goal >65 Continue ASA, lipitor Continue florinef Wean stress steroids to q12   RENAL A:   Hypernatremia. Hypokalemia.  P:   F/u BMET Replete electrolytes as indicated Continue Free water  GASTROINTESTINAL A:   Nutrition. Tolerating TF at goal P:   Tube feeds  Pepcid for SUP  HEMATOLOGIC A:   Anemia of chronic disease and critical illness. No obvious signs of bleeding P:  F/u CBC SQ heparin for DVT prophylaxis  INFECTIOUS A:   MDR gram neg UTI HCAP with Enterobacter >> MDR. C diff colitis. P:   Continue primaxin Continue enteral vancomycin Trend fever Trend WBC  ENDOCRINE A:   Chronic adrenal insufficiency. Hypoglycemia per CBG's, not BMET  P:   Florinef Attempt wean solucortef to q12 8/16 SSI   NEUROLOGIC A:   Acute encephalopathy. Hx seizures, strep pneumo meningitis, CVA. Agitated requiring increased sedation. P:   Monitor mental status Continue depakote, keppra  Continue Seroquel 100 mg BID - seems to be helping agitation  Wean off fent gtt as able  Daily WUA  FAMILY No family available 8/16.  Plan for family meeting with wife 10am 8/17.    Dale DressKaty Whiteheart, NP 07/20/2016  9:40 AM Pager: [phone] or [phone]) [phone]  Attending Note:  I have examined patient, reviewed labs, studies and notes. I have discussed the case with Dale [name], and I agree with the data and plans as amended above. 50 yo man, chronically debilitated from hx meningitis, with acute encephalopathy and resp failure in  setting UTI and sepsis. Remains intermittently agitated, has coarse breath sounds. Has not tolerated PSV at all this week due to low volumes, tachypnea. I suspect that he would require trach to allow him to be vent free. Hopefully this would allow decreased sedation and some neuro recovery. Unclear what his level of care will be long term. Have planned to discuss status, goals for care with family tomorrow.  Independent critical care time is 35 minutes.   Levy Pupaobert Katherine Syme, MD, PhD 07/20/2016, 1:45 PM [facility] [phone] or if no answer [phone]

## 2016-07-21 ENCOUNTER — Inpatient Hospital Stay (HOSPITAL_COMMUNITY): Payer: Medicaid Other

## 2016-07-21 LAB — BASIC METABOLIC PANEL
ANION GAP: 4 — AB (ref 5–15)
BUN: 14 mg/dL (ref 6–20)
CHLORIDE: 109 mmol/L (ref 101–111)
CO2: 32 mmol/L (ref 22–32)
Calcium: 8.2 mg/dL — ABNORMAL LOW (ref 8.9–10.3)
Creatinine, Ser: 0.68 mg/dL (ref 0.61–1.24)
Glucose, Bld: 89 mg/dL (ref 65–99)
POTASSIUM: 3.1 mmol/L — AB (ref 3.5–5.1)
SODIUM: 145 mmol/L (ref 135–145)

## 2016-07-21 LAB — GLUCOSE, CAPILLARY
GLUCOSE-CAPILLARY: 86 mg/dL (ref 65–99)
GLUCOSE-CAPILLARY: 90 mg/dL (ref 65–99)
GLUCOSE-CAPILLARY: 98 mg/dL (ref 65–99)
Glucose-Capillary: 108 mg/dL — ABNORMAL HIGH (ref 65–99)
Glucose-Capillary: 87 mg/dL (ref 65–99)
Glucose-Capillary: 90 mg/dL (ref 65–99)
Glucose-Capillary: 99 mg/dL (ref 65–99)

## 2016-07-21 LAB — CBC
HEMATOCRIT: 26.3 % — AB (ref 39.0–52.0)
HEMOGLOBIN: 8.3 g/dL — AB (ref 13.0–17.0)
MCH: 28.9 pg (ref 26.0–34.0)
MCHC: 31.6 g/dL (ref 30.0–36.0)
MCV: 91.6 fL (ref 78.0–100.0)
Platelets: 305 10*3/uL (ref 150–400)
RBC: 2.87 MIL/uL — AB (ref 4.22–5.81)
RDW: 18.5 % — ABNORMAL HIGH (ref 11.5–15.5)
WBC: 9.1 10*3/uL (ref 4.0–10.5)

## 2016-07-21 LAB — MAGNESIUM: MAGNESIUM: 2.1 mg/dL (ref 1.7–2.4)

## 2016-07-21 LAB — PHOSPHORUS: Phosphorus: 3.6 mg/dL (ref 2.5–4.6)

## 2016-07-21 MED ORDER — POTASSIUM CHLORIDE 20 MEQ/15ML (10%) PO SOLN
30.0000 meq | ORAL | Status: AC
  Administered 2016-07-21 (×2): 30 meq
  Filled 2016-07-21 (×2): qty 30

## 2016-07-21 MED ORDER — FENTANYL CITRATE (PF) 100 MCG/2ML IJ SOLN: 200.0000 ug | Freq: Once | INTRAMUSCULAR | Status: DC

## 2016-07-21 MED ORDER — QUETIAPINE FUMARATE 100 MG PO TABS
150.0000 mg | ORAL_TABLET | Freq: Two times a day (BID) | ORAL | Status: DC
  Administered 2016-07-21 – 2016-07-23 (×6): 150 mg
  Filled 2016-07-21 (×6): qty 1

## 2016-07-21 MED ORDER — VECURONIUM BROMIDE 10 MG IV SOLR
10.0000 mg | Freq: Once | INTRAVENOUS | Status: AC
  Administered 2016-07-22: 7 mg via INTRAVENOUS

## 2016-07-21 MED ORDER — ETOMIDATE 2 MG/ML IV SOLN
40.0000 mg | Freq: Once | INTRAVENOUS | Status: AC
  Administered 2016-07-22: 20 mg via INTRAVENOUS

## 2016-07-21 MED ORDER — CLONAZEPAM 1 MG PO TABS
1.0000 mg | ORAL_TABLET | Freq: Two times a day (BID) | ORAL | Status: DC
  Administered 2016-07-21 – 2016-07-23 (×6): 1 mg via ORAL
  Filled 2016-07-21 (×6): qty 1

## 2016-07-21 MED ORDER — MIDAZOLAM HCL 2 MG/2ML IJ SOLN
4.0000 mg | Freq: Once | INTRAMUSCULAR | Status: DC
  Filled 2016-07-21 (×2): qty 4

## 2016-07-21 MED ORDER — PROPOFOL 500 MG/50ML IV EMUL
5.0000 ug/kg/min | INTRAVENOUS | Status: DC
  Filled 2016-07-21: qty 50

## 2016-07-21 NOTE — NC FL2 (Signed)
[place] LEVEL OF CARE SCREENING TOOL     IDENTIFICATION  Patient Name: Dale [name] Birthdate: [birth year] Sex: male Admission Date (Current Location): 07/12/2016  [facility] and [place] Number:  Herbalist and Address:  [facility] [place], [place] [address] [place], [place] [address]      Provider Number: [id number]  Attending Physician Name and Address:  Juanito Doom, MD  Relative Name and Phone Number:       Current Level of Care: Hospital Recommended Level of Care: Vent SNF Prior Approval Number:    Date Approved/Denied:   PASRR Number: [id number]  Discharge Plan: SNF    Current Diagnoses: Patient Active Problem List   Diagnosis Date Noted  . UTI (lower urinary tract infection)   . New onset a-fib ([place])   . Encephalopathy 07/12/2016  . Acute hypoxemic respiratory failure ([place]) 07/12/2016  . Arterial hypotension   . Adrenal insufficiency (Addison's disease) ([place])   . Bradycardia   . Hypoglycemia 07/02/2016  . Pressure ulcer 06/26/2016  . Acute respiratory failure with hypercapnia ([place])   . Encounter for central line placement   . Acute respiratory failure with hypoxemia ([place])   . Hypothermia 05/19/2016  . Seizure ([place])   . Weak 05/06/2016  . Convulsions/seizures ([place]) 05/04/2016  . Urinary retention 05/04/2016  . Hyperlipidemia 05/04/2016  . Dysphagia 05/04/2016  . Low serum cortisol level ([place]) 05/04/2016  . Dementia 04/27/2016  . 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 Moderate protein-calorie malnutrition ([place])   . Palliative care encounter   . Cerebral septic emboli ([place])   . Sepsis ([place])   . Cerebral thrombosis with cerebral infarction 03/25/2016  . Meningoencephalitis   . Bacterial meningitis   . Acute ischemic stroke ([place])   . Severe sepsis ([place])   . Meningitis, pneumococcal, recurrent   . Systolic and diastolic CHF, chronic ([place])   . Acute renal failure ([place])   . Hypernatremia   . NSTEMI (non-ST  elevated myocardial infarction) ([place]) 03/14/2016  . Physical deconditioning 03/14/2016  . Tracheostomy status ([place])   . Encounter for palliative care   . Goals of care, counseling/discussion   . Tracheostomy care ([place])   . Respiratory failure ([employer]) 03/03/2016  . Acute respiratory failure with hypoxia ([place])   . Septic shock ([place])   . Cardiogenic shock ([place])   . Cardiac arrest ([place])   . Hydrocephalus   . Altered mental status   . CHF (congestive heart failure) ([place])   . 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 Homeless 02/16/2016  . Meningitis, streptococcal 02/16/2016  . Tobacco abuse 02/16/2016  . Gram positive sepsis ([place]) 02/16/2016  . Underweight 02/16/2016  . ETOH abuse   . Substance abuse   . Acute encephalopathy   . Acute respiratory failure (HCC)     Orientation RESPIRATION BLADDER Height & Weight        Vent Indwelling catheter Weight: 137 lb 5.6 oz (62.3 kg) Height:  '5\' 8"'$  (172.7 cm)  BEHAVIORAL SYMPTOMS/MOOD NEUROLOGICAL BOWEL NUTRITION STATUS      Incontinent (Rectal Tube) Supplemental (feeding supplement (VITAL AF 1.2 CAL) liquid 1,000 mL, 60 mL/hr : Per Tube)  AMBULATORY STATUS COMMUNICATION OF NEEDS Skin   Total Care Does not communicate Normal                       Personal Care Assistance Level of Assistance  Total care Bathing Assistance: Maximum assistance Feeding assistance: Maximum assistance Dressing Assistance:  Maximum assistance Total Care Assistance: Maximum assistance   Functional Limitations Info  Sight, Hearing Sight Info: Impaired Hearing Info: Impaired Speech Info: Impaired    SPECIAL CARE FACTORS FREQUENCY                       Contractures Contractures Info: Not present    Additional Factors Info  Code Status, Allergies, Isolation Precautions, Insulin Sliding Scale, Psychotropic Code Status Info: Full Code Allergies Info: No known allergies Psychotropic Info: Medications Insulin Sliding Scale Info: Every 4  Hours Isolation Precautions Info: Contact/Enteric     Current Medications (07/21/2016):  This is the current hospital active medication list Current Facility-Administered Medications  Medication Dose Route Frequency Provider Last Rate Last Dose  . 0.9 %  sodium chloride infusion   Intravenous Continuous Coralyn Helling, MD 10 mL/hr at 07/21/16 0800    . albuterol (PROVENTIL) (2.5 MG/3ML) 0.083% nebulizer solution 2.5 mg  2.5 mg Nebulization Q3H PRN Rahul P Desai, PA-C      . antiseptic oral rinse solution (CORINZ)  7 mL Mouth Rinse 10 times per day Nelda Bucks, MD   7 mL at 07/21/16 1416  . aspirin tablet 325 mg  325 mg Per Tube Daily Rahul P Desai, PA-C   325 mg at 07/21/16 1028  . atorvastatin (LIPITOR) tablet 20 mg  20 mg Per Tube q1800 Rahul P Desai, PA-C   20 mg at 07/20/16 1713  . chlorhexidine gluconate (SAGE KIT) (PERIDEX) 0.12 % solution 15 mL  15 mL Mouth Rinse BID Nelda Bucks, MD   15 mL at 07/21/16 0800  . clonazePAM (KLONOPIN) tablet 1 mg  1 mg Oral BID Bernadene Person, NP   1 mg at 07/21/16 1028  . famotidine (PEPCID) 40 MG/5ML suspension 20 mg  20 mg Per Tube BID Lupita Leash, MD   20 mg at 07/21/16 1028  . feeding supplement (VITAL AF 1.2 CAL) liquid 1,000 mL  1,000 mL Per Tube Continuous Leslye Peer, MD 60 mL/hr at 07/21/16 1200 1,000 mL at 07/21/16 1200  . fentaNYL (SUBLIMAZE) 2,500 mcg in sodium chloride 0.9 % 250 mL (10 mcg/mL) infusion  25-150 mcg/hr Intravenous Continuous Leslye Peer, MD 14 mL/hr at 07/21/16 1412 140 mcg/hr at 07/21/16 1412  . fentaNYL (SUBLIMAZE) bolus via infusion 50 mcg  50 mcg Intravenous Q1H PRN Lupita Leash, MD   50 mcg at 07/21/16 0900  . fentaNYL (SUBLIMAZE) injection 100 mcg  100 mcg Intravenous Q2H PRN Rahul P Desai, PA-C   100 mcg at 07/21/16 1057  . fludrocortisone (FLORINEF) tablet 0.1 mg  0.1 mg Per Tube Daily Rahul P Desai, PA-C   0.1 mg at 07/21/16 1026  . folic acid (FOLVITE) tablet 1 mg  1 mg Per Tube Daily  Lupita Leash, MD   1 mg at 07/21/16 1028  . free water 400 mL  400 mL Per Tube Q6H Leslye Peer, MD   400 mL at 07/21/16 1000  . heparin injection 5,000 Units  5,000 Units Subcutaneous Q8H Rahul P Desai, PA-C   5,000 Units at 07/21/16 1413  . hydrocortisone sodium succinate (SOLU-CORTEF) 100 MG injection 50 mg  50 mg Intravenous Q12H Bernadene Person, NP   50 mg at 07/21/16 0523  . insulin aspart (novoLOG) injection 0-20 Units  0-20 Units Subcutaneous Q4H Rahul P Desai, PA-C   3 Units at 07/19/16 1538  . levETIRAcetam (KEPPRA) 100 MG/ML solution 1,500 mg  1,500  mg Per Tube BID Chesley Mires, MD   1,500 mg at 07/21/16 1028  . meropenem (MERREM) 1 g in sodium chloride 0.9 % 100 mL IVPB  1 g Intravenous Q8H Collene Gobble, MD   1 g at 07/21/16 1400  . midazolam (VERSED) injection 2 mg  2 mg Intravenous Q2H PRN Juanito Doom, MD   2 mg at 07/21/16 1256  . QUEtiapine (SEROQUEL) tablet 150 mg  150 mg Per Tube BID Marijean Heath, NP   150 mg at 07/21/16 1028  . sodium chloride flush (NS) 0.9 % injection 10-40 mL  10-40 mL Intracatheter Q12H Chesley Mires, MD   10 mL at 07/21/16 1027  . sodium chloride flush (NS) 0.9 % injection 10-40 mL  10-40 mL Intracatheter PRN Chesley Mires, MD      . thiamine (VITAMIN B-1) tablet 100 mg  100 mg Per Tube Daily Juanito Doom, MD   100 mg at 07/21/16 1028  . Valproate Sodium (DEPAKENE) solution 750 mg  750 mg Per Tube Q8H Chesley Mires, MD   750 mg at 07/21/16 1419  . vancomycin (VANCOCIN) 50 mg/mL oral solution 500 mg  500 mg Per Tube Q6H Anders Simmonds, MD   500 mg at 07/21/16 1200     Discharge Medications: Please see discharge summary for a list of discharge medications.  Relevant Imaging Results:  Relevant Lab Results:   Additional Information SSN:  [id number]  Darden Dates, LCSW

## 2016-07-21 NOTE — Progress Notes (Signed)
md met w family. Pt was at snf pta. fam wants to proceed w trach. Have alerted sw that if not able to wean off vent will need vent snf. Pt has medicaid so ltac NOT AN OPTION. LTAC'S DO NOT TAKE MEDIAID.

## 2016-07-21 NOTE — Progress Notes (Signed)
Pharmacy Antibiotic Note  Dale [name] is a 50 y.o. male admitted from SNF on 07/12/2016 with sepsis requiring norepinephrine.  Pharmacy has been consulted for Merrem after both urine and sputum culture with enterobacter - resistant to Zosyn. Noted pt just d/c from hospital 8/7 after 3 wk long hospitalization and recent strep pneumo meningitis. CBC wnl, AF, renal function stable.  Plan: Merrem 1g every 8 hours - now on day 7 LOT plans - possibly discontinue 8/18 which will be 8 days of therapy  Height: 5\' 8"  (172.7 cm) Weight: 137 lb 5.6 oz (62.3 kg) IBW/kg (Calculated) : 68.4  Temp (24hrs), Avg:96.8 F (36 C), Min:96 F (35.6 C), Max:97.5 F (36.4 C)   Recent Labs Lab 07/17/16 0445 07/18/16 0500 07/19/16 0446 07/19/16 2026 07/20/16 0313 07/21/16 0427  WBC 8.1 8.6 8.8  --  10.8* 9.1  CREATININE 0.64 0.68 0.69 0.67 0.74 0.68    Estimated Creatinine Clearance: 96.3 mL/min (by C-G formula based on SCr of 0.8 mg/dL).    No Known Allergies  Antimicrobials this admission: 8/9 Zosyn>>8/11 8/9 vanc>8/10 8/11 Imipenem> then switched to Merrem due to history of seizures (no dose given) 8/11 Merrem >  Microbiology results: 8/9 BCx x2: 8/8 BCx x2:  8/8 UCx: enterobacter cloacae - zosyn resistent  Sputum:  enterobacter 8/8 MRSA PCR:   Sherron MondayAubrey N. Labombard, PharmD Clinical Pharmacy Resident Pager: [phone] 07/21/16 8:04 AM

## 2016-07-21 NOTE — Progress Notes (Signed)
[facility] FOR [facility] LAB REPLACEMENT ONLY  The patient does [facility] the [facility] Electrolyte [facility] Protocol based on the criteria listed below:   1. Is GFR >/= 40 ml/min? Yes.    Patient's GFR today is >60 2. Is urine output >/= 0.5 ml/kg/hr for the last 6 hours? Yes.   Patient's UOP is 1.6 ml/kg/hr 3. Is BUN < 60 mg/dL? Yes.    Patient's BUN today is 14 4. Abnormal electrolyte(s):K3.1 5. Ordered repletion with: Per protocol 6. If a panic level lab has been reported, has the CCM MD in charge been notified? Yes.  .   Physician:  Robbie Lis Simonds, MD  Melrose NakayamaChisholm, Neva Ramaswamy William 07/21/2016 5:25 AM

## 2016-07-21 NOTE — Progress Notes (Signed)
PULMONARY / CRITICAL CARE MEDICINE   Name: Dale [name] MRN: [id number] DOB: 1966-09-05    ADMISSION DATE:  07/12/2016 CONSULTATION DATE:  07/12/16  REFERRING MD:  APH  CHIEF COMPLAINT:  AMS  SUBJECTIVE:   Agitation maybe be slightly improved per RN, still requiring frequent PRN versed in addition to Fent gtt and parenteral seroquel. No effort on SBT even when awake/agitated.   VITAL SIGNS: BP 94/66   Pulse 62   Temp 97.5 F (36.4 C) (Oral)   Resp 14   Ht 5\' 8"  (1.727 m)   Wt 62.3 kg (137 lb 5.6 oz)   SpO2 100%   BMI 20.88 kg/m   HEMODYNAMICS:    VENTILATOR SETTINGS: Vent Mode: PRVC FiO2 (%):  [30 %] 30 % Set Rate:  [14 bmp] 14 bmp Vt Set:  [550 mL] 550 mL PEEP:  [5 cmH20] 5 cmH20 Plateau Pressure:  [15 cmH20-17 cmH20] 15 cmH20  INTAKE / OUTPUT: I/O last 3 completed shifts: In: 5163.1 [I.V.:793.1; NG/GT:3720; IV Piggyback:650] Out: 5225 [Urine:4100; Stool:1125]  PHYSICAL EXAMINATION: General: ill appearing, intubated on vent Neuro: RASS +1, squirming in bed, kicking, not following commands, jerking head around  HEENT: mm moist, no JVD, ETT in place Cardiac: regular, bradycardic Chest: resps even non labored on vent, scattered rhonchi Abd: soft, non tender Ext: 1+ edema UE, 2+ LLE Skin: sacral pressure ulcer  LABS:  BMET  Recent Labs Lab 07/19/16 2026 07/20/16 0313 07/21/16 0427  NA 146* 147* 145  K 3.8 3.4* 3.1*  CL 108 109 109  CO2 34* 33* 32  BUN 12 15 14   CREATININE 0.67 0.74 0.68  GLUCOSE 95 110* 89    Electrolytes  Recent Labs Lab 07/14/16 1600  07/16/16 0400  07/18/16 0500  07/19/16 2026 07/20/16 0313 07/21/16 0427  CALCIUM  --   < > 7.8*  < > 8.0*  < > 8.2* 8.2* 8.2*  MG 1.7  --  2.1  --  1.8  --   --  1.8 2.1  PHOS 2.1*  --  2.8  --   --   --   --  3.2  --   < > = values in this interval not displayed.  CBC  Recent Labs Lab 07/19/16 0446 07/20/16 0313 07/21/16 0427  WBC 8.8 10.8* 9.1  HGB 8.6* 8.3* 8.3*  HCT 27.6*  26.5* 26.3*  PLT 268 275 305    Coag's  Recent Labs Lab 07/15/16 1300  APTT 36  INR 1.00    Sepsis Markers  Recent Labs Lab 07/15/16 0400  PROCALCITON <0.10    ABG  Recent Labs Lab 07/14/16 1345  PHART 7.404  PCO2ART 40.6  PO2ART 161*    Liver Enzymes No results for input(s): AST, ALT, ALKPHOS, BILITOT, ALBUMIN in the last 168 hours.  Cardiac Enzymes No results for input(s): TROPONINI, PROBNP in the last 168 hours.  Glucose  Recent Labs Lab 07/20/16 1214 07/20/16 1553 07/20/16 1959 07/21/16 0003 07/21/16 0421 07/21/16 0906  GLUCAP 87 88 107* 90 86 98    Imaging Dg Chest Port 1 View  Result Date: 07/21/2016 CLINICAL DATA:  50 y/o  M; respiratory failure. EXAM: PORTABLE CHEST 1 VIEW COMPARISON:  Chest radiograph dated 07/18/2016. FINDINGS: Enteric tube tip projects over the mid abdomen. Endotracheal tube tip is 4.5 cm from the carina. Right central venous catheter tip projects over the lower SVC. Patient is rotated. Bibasilar opacities may represent atelectasis, effusion, and/or pneumonia and are stable. IMPRESSION: Stable bibasilar opacities which  may represent atelectasis, effusions, and/or pneumonia. Stable lines and tubes. Electronically Signed   By: Mitzi HansenLance  Furusawa-Stratton M.D.   On: 07/21/2016 05:15    STUDIES:  CT head 8/8 >> old b/l infarcts with subacute infarct again identified at inferior left caudate.  No acute process. EEG 8/10 >>suppressed backround MRI brain 8/11 >> multifocal acute Lt MCA ischemic infarcts, possible infarct Rt caudate head  CULTURES: 8/08 Urine >> Enterobacter 8/08 Blood >> NEG 8/09 Sputum >> MDR Enterobacter 8/09 Blood >> NEG 8/12 C diff >> antigen and toxin positive  ANTIBIOTICS: Vanc 8/8 >>8/10 Zosyn 8/8 >>8/11 meropenem 8/11>> Oral vancomycin 8/12 >>  SIGNIFICANT EVENTS: 8/08admitted with unresponsiveness, required intubation. 8/09 low cvp, levophed, possible seizure  8/10 not moving rt ext upper and  lowers 8/13 Following commands 8/15>> Following Commands, but agitated., Norepi off 8/15 am.  LINES/TUBES: ETT 8/8 > R IJ CVL 8/8 >  DISCUSSION: 50 yo male from NH with frequent admissions admitted again 8/18 with altered mental status, inability to protect airway.  He was hypotensive and hypothermic from UTI.  He has PMHx of combined CHF, Streptococcal meningitis, ETOH, Depression, Seizures, Dementia, CVA.  ASSESSMENT / PLAN:  PULMONARY A: Acute hypoxic respiratory failure - due to inability to protect airway. 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 8/15 CXR : Stable bibasilar atelectasis and/or infiltrates. Stable small bilateral pleural effusions, unchanged from 8/13 Failed SBT with low TV. P:   Vent support - 8cc/kg  F/u CXR  F/u ABG  Daily SBT  Suspect he will need repeat trach v comfort - planned family meeting today    CARDIOVASCULAR A:  Septic shock from UTI, HCAP, C diff colitis - resolved.  Chronic combined CHF, HLD. P:  MAP goal >65 Continue ASA, lipitor Continue florinef Wean stress steroids to q12   RENAL A:   Hypernatremia. Hypokalemia.  P:   F/u BMET Replete K Continue Free water Check phos 8/17  GASTROINTESTINAL A:   Nutrition. Tolerating TF at goal P:   Tube feeds  Pepcid for SUP  HEMATOLOGIC A:   Anemia of chronic disease and critical illness. No obvious signs of bleeding P:  F/u CBC SQ heparin for DVT prophylaxis  INFECTIOUS A:   MDR gram neg UTI HCAP with Enterobacter >> MDR. C diff colitis. P:   Continue primaxin Continue enteral vancomycin Trend fever, WBC  ENDOCRINE A:   Chronic adrenal insufficiency. Hypoglycemia per CBG's, not BMET P:   Florinef Stress steroids -wean as able - reduced to q12 8/16 SSI   NEUROLOGIC A:   Acute encephalopathy. Hx seizures, strep pneumo meningitis, CVA. Agitated requiring increased sedation. P:   Monitor mental status Continue depakote, keppra  Increase Seroquel  to 150 mg BID 8/17  Add low dose clonazepam 8/17 Avoid versed pushes as able  Wean fent gtt as able  Daily WUA  FAMILY Plan for family meeting with wife 10am 8/17.    Dirk DressKaty Whiteheart, NP 07/21/2016  9:20 AM Pager: [phone] or [phone]   Attending Note:  I have examined patient, reviewed labs, studies and notes. I have discussed the case with Jasper RilingK Whiteheart, and I agree with the data and plans as amended above.  50 yo man, chronically debilitated from hx meningitis, with acute encephalopathy and resp failure in setting UTI and sepsis. Continues to be agitated, tolerate PSV poorly. he will track but not follow commands today. Coarse breath sounds. We will complete 8 days imipenem, continue vanco enteral. I will meet with his  family today to discuss GOC. Independent critical care time is 40 minutes.   Levy Pupaobert Latroya Ng, MD, PhD 07/21/2016, 10:24 AM [facility] [phone] or if no answer [phone]   Interdisciplinary Goals of Care Family Meeting    Date carried out:: 07/21/2016  Location of the meeting: Waiting Room  Member's involved: Physician, Bedside Registered Nurse and Other: Patient's wife, daughters, nephew, other younger children.   Durable Power of Insurance risk surveyorAttorney or acting medical decision maker: wife    Discussion: We discussed goals of care for Bonnita Hollowraig D Hansen .  Explained that his agitation, chronic encephalopathy, make recovery from this acute illness difficult. Prognosis for return to previous (impaired) baseline is guarded. At this point do not believe that he will make any weaning progress without tracheostomy. I related this to wife and she indicated that he would want to proceed. I will arrange for trach. Also I recommended that given low likelihood that he would survive ACLS should he decompensate that we should defer CPR, change code status. They are not ready to make such a change at this time.   Code status: Full Code  Disposition: Continue  current acute care  Time spent for the meeting: 20 minutes   Levy Pupaobert Calem Cocozza, MD, PhD 07/21/2016, 10:32 AM  Pulmonary and Critical Care [phone] or if no answer [phone]

## 2016-07-21 NOTE — Progress Notes (Signed)
Wasted 45ml of Fentanyl with Anelle Parlow RN per protocol.

## 2016-07-22 ENCOUNTER — Inpatient Hospital Stay (HOSPITAL_COMMUNITY): Payer: Medicaid Other

## 2016-07-22 LAB — PROTIME-INR
INR: 0.96
PROTHROMBIN TIME: 12.8 s (ref 11.4–15.2)

## 2016-07-22 LAB — BASIC METABOLIC PANEL
Anion gap: 9 (ref 5–15)
BUN: 13 mg/dL (ref 6–20)
CALCIUM: 8.5 mg/dL — AB (ref 8.9–10.3)
CHLORIDE: 111 mmol/L (ref 101–111)
CO2: 28 mmol/L (ref 22–32)
CREATININE: 0.73 mg/dL (ref 0.61–1.24)
GFR calc non Af Amer: >60 mL/min (ref >60)
GLUCOSE: 80 mg/dL (ref 65–99)
Potassium: 3 mmol/L — ABNORMAL LOW (ref 3.5–5.1)
Sodium: 148 mmol/L — ABNORMAL HIGH (ref 135–145)

## 2016-07-22 LAB — CBC
HCT: 26.7 % — ABNORMAL LOW (ref 39.0–52.0)
Hemoglobin: 8.3 g/dL — ABNORMAL LOW (ref 13.0–17.0)
MCH: 28.8 pg (ref 26.0–34.0)
MCHC: 31.1 g/dL (ref 30.0–36.0)
MCV: 92.7 fL (ref 78.0–100.0)
PLATELETS: 341 10*3/uL (ref 150–400)
RBC: 2.88 MIL/uL — AB (ref 4.22–5.81)
RDW: 19 % — AB (ref 11.5–15.5)
WBC: 11.6 10*3/uL — ABNORMAL HIGH (ref 4.0–10.5)

## 2016-07-22 LAB — GLUCOSE, CAPILLARY
GLUCOSE-CAPILLARY: 81 mg/dL (ref 65–99)
GLUCOSE-CAPILLARY: 119 mg/dL — AB (ref 65–99)
GLUCOSE-CAPILLARY: 93 mg/dL (ref 65–99)
Glucose-Capillary: 83 mg/dL (ref 65–99)
Glucose-Capillary: 89 mg/dL (ref 65–99)

## 2016-07-22 LAB — APTT: aPTT: 34 seconds (ref 24–36)

## 2016-07-22 MED ORDER — NOREPINEPHRINE BITARTRATE 1 MG/ML IV SOLN
0.0000 ug/min | INTRAVENOUS | Status: DC
  Administered 2016-07-22: 2 ug/min via INTRAVENOUS
  Filled 2016-07-22 (×2): qty 4

## 2016-07-22 MED ORDER — POTASSIUM CHLORIDE 20 MEQ/15ML (10%) PO SOLN
30.0000 meq | ORAL | Status: AC
  Administered 2016-07-22 (×2): 30 meq
  Filled 2016-07-22 (×2): qty 30

## 2016-07-22 MED ORDER — HYDROCORTISONE 5 MG PO TABS
5.0000 mg | ORAL_TABLET | Freq: Two times a day (BID) | ORAL | Status: DC
  Administered 2016-07-22 – 2016-07-28 (×13): 5 mg
  Filled 2016-07-22 (×14): qty 1

## 2016-07-22 NOTE — Progress Notes (Signed)
Called Wife Mickeal SkinnerShirley Konopka for verbal consent for Trachestomy at bedside for her husband. Verified by two RN per protocol. Consent signed by both Rn.  Leretha PolMisty K Harmon RN

## 2016-07-22 NOTE — Progress Notes (Signed)
Nutrition Follow-up  DOCUMENTATION CODES:   Severe malnutrition in context of chronic illness  INTERVENTION:   Vital AF 1.2 @ 60 ml/hr via Cortrak Provides: 1440 ml, 1728 kcal, 108 grams protein, and 1167 ml H2O. Total free water: 2767 ml   NUTRITION DIAGNOSIS:   Malnutrition (Severe) related to chronic illness as evidenced by severe depletion of body fat, severe depletion of muscle mass. Ongoing.   GOAL:   Patient will meet greater than or equal to 90% of their needs Progressing.   MONITOR:   TF tolerance, Skin, I & O's, Vent status, Labs, Weight trends  ASSESSMENT:   50 y.o. M with complex medical history and multiple recent admissions (strep pneumo meningitis, cardiac arrest, cardiogenic shock, ARDS, CVA, seizures) resides in nursing home, admitted again 8/8 with unresponsiveness and inability to protect airway, intubated in ED.  Hypotensive and hypothermic but has this chronically due to adrenal insufficiency.   8/18 trach and Cortrak Per RN plan for PEG soon.  C.diff positive  Patient is currently intubated on ventilator support MV: 7.7 L/min Temp (24hrs), Avg:97 F (36.1 C), Min:96 F (35.6 C), Max:98.2 F (36.8 C)   Medications reviewed and include: folvite, thiamine Labs reviewed: Na 148, K+ 3.0 CBG's: 81-119 Free water: 400 ml every 6 hours = 1600 ml  Diet Order:  Diet NPO time specified  Skin:  Wound (see comment) (stage II scrotum and sacrum, MASD perineum)  Last BM:  8/17 725 ml via rectal pouch  Height:   Ht Readings from Last 1 Encounters:  07/22/16 5\' 8"  (1.727 m)    Weight:   Wt Readings from Last 1 Encounters:  07/22/16 136 lb 11 oz (62 kg)    Ideal Body Weight:  70 kg  BMI:  Body mass index is 20.78 kg/m.  Estimated Nutritional Needs:   Kcal:  1704  Protein:  90-115 grams  Fluid:  > 1.9 L/day  EDUCATION NEEDS:   No education needs identified at this time  Kendell BaneHeather Brenn Gatton RD, LDN, CNSC [phone] Pager [phone] After  Hours Pager

## 2016-07-22 NOTE — Procedures (Signed)
Percutaneous Tracheostomy Placement  Consent from family.  Patient sedated, paralyzed and position.  Placed on 100% FiO2 and RR matched.  Area cleaned and draped.  Lidocaine/epi injected.  Skin incision done followed by blunt dissection.  Trachea palpated then punctured, catheter passed and visualized bronchoscopically.  Wire placed and visualized.  Catheter removed.  Airway then entered and dilated.  Size 6 cuffed shiley trach placed and visualized bronchoscopically well above carina.  Good volume returns.  Patient tolerated the procedure well without complications.  Minimal blood loss.  CXR ordered and pending.  [name], M.D. [facility]. Pager: [phone]. After hours pager: [phone].  

## 2016-07-22 NOTE — Progress Notes (Signed)
[facility] FOR AM LAB REPLACEMENT ONLY  The patient does [facility] Protocol based on the criteria listed below:   1. Is GFR >/= 40 ml/min? Yes.    Patient's GFR today is >60 2. Is urine output >/= 0.5 ml/kg/hr for the last 6 hours? Yes.   Patient's UOP is 1.0 ml/kg/hr 3. Is BUN < 60 mg/dL? Yes.    Patient's BUN today is 13 4. Abnormal electrolyte(s):  K - 3.0 5. Ordered repletion with: PER PROTOCOL 6. If a panic level lab has been reported, has the CCM MD in charge been notified? Yes.  .   Physician:  Dr. Elwyn Reachamaswamy  Dale [name] 07/22/2016 6:04 AM

## 2016-07-22 NOTE — Procedures (Signed)
Intubation Procedure Note Dale [name] [id number] 03/02/1966  Procedure: Intubation Indications: ETT needed to be changed  Procedure Details Consent: Risks of procedure as well as the alternatives and risks of each were explained to the (patient/caregiver).  Consent for procedure obtained. and Unable to obtain consent because of altered level of consciousness. Time Out: Verified patient identification, verified procedure, site/side was marked, verified correct patient position, special equipment/implants available, medications/allergies/relevent history reviewed, required imaging and test results available.  Performed  MAC and 3 Medications:  Fentanyl 200 mcg Etomidate 20 mg Versed 2 mg NMB Vecuronium 7mg    Evaluation Hemodynamic Status: BP stable throughout; O2 sats: stable throughout Patient's Current Condition: stable Complications: No apparent complications Patient did tolerate procedure well. Chest X-ray ordered to verify placement.  CXR: pending.   Dale CanalesSteve [name] ACNP Dale PollackLe [name] PCCM Pager [phone] till 3 pm If no answer page [phone] 07/22/2016, 11:33 AM  Dale ReedyWesam G. [name], M.D. [facility]. Pager: [phone]. After hours pager: [phone].

## 2016-07-22 NOTE — Progress Notes (Signed)
Pt got trach today. Remains on vent. sw working for vent snf or back to snf if gets of vent. Cm will cont to follow. Not ltac candidate due to medicaid ins.

## 2016-07-22 NOTE — Procedures (Signed)
Bronchoscopy  for Percutaneous  Tracheostomy  Name: Dale HollowCraig D [name] MRN: [id number] DOB: 10/21/1966 Procedure: Bronchoscopy for Percutaneous Tracheostomy Indications: Tracheostomy In conjunction with: Dr. Molli KnockYacoub  Procedure Details Consent: Risks of procedure as well as the alternatives and risks of each were explained to the (patient/caregiver).  Consent for procedure obtained. Time Out: Verified patient identification, verified procedure, site/side was marked, verified correct patient position, special equipment/implants available, medications/allergies/relevent history reviewed, required imaging and test results available.  Performed  In preparation for procedure, patient was given 100% FiO2 and bronchoscope lubricated. Sedation: Benzodiazepines, Muscle relaxants and Etomidate  Airway entered and the following bronchi were examined: trachea to carina   Procedures performed: Endotracheal Tube retracted in 2 cm increments. Cannulation of airway observed. Dilation observed. Placement of trachel tube  observed . No overt complications. Bronchoscope removed.    Evaluation Hemodynamic Status: BP stable throughout; O2 sats: stable throughout Patient's Current Condition: stable Specimens:  None Complications: No apparent complications Patient did tolerate procedure well.   [name] ACNP [name] Pager [phone] till 3 pm If no answer page [phone] 07/22/2016, 11:28 AM

## 2016-07-22 NOTE — Procedures (Signed)
Bedside Tracheostomy Insertion Procedure Note   Patient Details:   Name: Dale [name] DOB: 08/08/1966 MRN: [id number]  Procedure: Tracheostomy  Pre Procedure Assessment: ET Tube Size: 8.0 ET Tube secured at lip (cm): 24 Bite block in place: No Breath Sounds: Clear  Post Procedure Assessment: BP (!) 76/60   Pulse (!) 51   Temp (!) 96 F (35.6 C) (Oral) Comment: bear hugger placed on high  Resp 14   Ht 5\' 8"  (1.727 m)   Wt 136 lb 11 oz (62 kg)   SpO2 100%   BMI 20.78 kg/m  O2 sats: stable throughout Complications: No apparent complications Patient did tolerate procedure well Tracheostomy Brand:Shiley Tracheostomy Style:Cuffed Tracheostomy Size: 6 Tracheostomy Secured WUJ:[id number]:Sutures, velcro Tracheostomy Placement Confirmation:Trach cuff visualized and in place and Chest X ray ordered for placement    Dale [name], Dale [name] 07/22/2016, 11:33 AM

## 2016-07-22 NOTE — Progress Notes (Signed)
PULMONARY / CRITICAL CARE MEDICINE   Name: Dale [name] MRN: [id number] DOB: [birth year]    ADMISSION DATE:  07/12/2016 CONSULTATION DATE:  07/12/16  REFERRING MD:  APH  CHIEF COMPLAINT:  AMS  SUBJECTIVE:   Agitation better today w addition clonazepam and increased seroquel.  Still getting some prn versed fentanyl gtt running.   VITAL SIGNS: BP 116/90   Pulse (!) 54   Temp 97.1 F (36.2 C) (Oral)   Resp 14   Ht '5\' 8"'$  (1.727 m)   Wt 62 kg (136 lb 11 oz)   SpO2 100%   BMI 20.78 kg/m   HEMODYNAMICS:    VENTILATOR SETTINGS: Vent Mode: PRVC FiO2 (%):  [30 %] 30 % Set Rate:  [14 bmp] 14 bmp Vt Set:  [550 mL] 550 mL PEEP:  [5 cmH20] 5 cmH20 Plateau Pressure:  [14 cmH20-17 cmH20] 17 cmH20  INTAKE / OUTPUT: I/O last 3 completed shifts: In: 4685.7 [I.V.:828.7; NG/GT:3457; IV [id number]:086] Out: 5784 [Urine:4490; Emesis/NG output:300; Stool:925]  PHYSICAL EXAMINATION: General: ill appearing, intubated on vent Neuro: RASS -1, more calm, will still move around without clear purpose when stimulated HEENT: mm moist, no JVD, ETT in place Cardiac: regular, bradycardic Chest: resps even non labored on vent, scattered rhonchi Abd: soft, non tender Ext: 1+ edema UE, 2+ LLE Skin: sacral pressure ulcer  LABS:  BMET  Recent Labs Lab 07/20/16 0313 07/21/16 0427 07/22/16 0430  NA 147* 145 148*  K 3.4* 3.1* 3.0*  CL 109 109 111  CO2 33* 32 28  BUN '15 14 13  '$ CREATININE 0.74 0.68 0.73  GLUCOSE 110* 89 80    Electrolytes  Recent Labs Lab 07/16/16 0400  07/18/16 0500  07/20/16 0313 07/21/16 0427 07/21/16 1027 07/22/16 0430  CALCIUM 7.8*  < > 8.0*  < > 8.2* 8.2*  --  8.5*  MG 2.1  --  1.8  --  1.8 2.1  --   --   PHOS 2.8  --   --   --  3.2  --  3.6  --   < > = values in this interval not displayed.  CBC  Recent Labs Lab 07/20/16 0313 07/21/16 0427 07/22/16 0430  WBC 10.8* 9.1 11.6*  HGB 8.3* 8.3* 8.3*  HCT 26.5* 26.3* 26.7*  PLT 275 305 341     Coag's  Recent Labs Lab 07/15/16 1300 07/22/16 0430  APTT 36 34  INR 1.00 0.96    Sepsis Markers No results for input(s): LATICACIDVEN, PROCALCITON, O2SATVEN in the last 168 hours.  ABG No results for input(s): PHART, PCO2ART, PO2ART in the last 168 hours.  Liver Enzymes No results for input(s): AST, ALT, ALKPHOS, BILITOT, ALBUMIN in the last 168 hours.  Cardiac Enzymes No results for input(s): TROPONINI, PROBNP in the last 168 hours.  Glucose  Recent Labs Lab 07/21/16 1154 07/21/16 1532 07/21/16 2036 07/21/16 2338 07/22/16 0426 07/22/16 0738  GLUCAP 90 87 99 108* 83 81    Imaging No results found.  STUDIES:  CT head 8/8 >> old b/l infarcts with subacute infarct again identified at inferior left caudate.  No acute process. EEG 8/10 >>suppressed backround MRI brain 8/11 >> multifocal acute Lt MCA ischemic infarcts, possible infarct Rt caudate head  CULTURES: 8/08 Urine >> Enterobacter 8/08 Blood >> NEG 8/09 Sputum >> MDR Enterobacter 8/09 Blood >> NEG 8/12 C diff >> antigen and toxin positive  ANTIBIOTICS: Vanc 8/8 >>8/10 Zosyn 8/8 >>8/11 meropenem 8/11>> 8/18 Oral vancomycin 8/12 >>  SIGNIFICANT EVENTS:  8/08 admitted with unresponsiveness, required intubation. 8/09 low cvp, levophed, possible seizure  8/10 not moving rt ext upper and lowers 8/13 Following commands 8/15>> Following Commands, but agitated., Norepi off 8/15 am.  LINES/TUBES: ETT 8/8 > R IJ CVL 8/8 >  DISCUSSION: 50 yo male from NH with frequent admissions admitted again 8/18 with altered mental status, inability to protect airway.  He was hypotensive and hypothermic from UTI.  He has PMHx of combined CHF, Streptococcal meningitis, ETOH, Depression, Seizures, Dementia, CVA.  ASSESSMENT / PLAN:  PULMONARY A: Acute hypoxic respiratory failure - due to inability to protect airway. 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 P:   Vent support - 8cc/kg  Planning for  tracheostomy on 8/18. Agitation has been a large barrier to SBT's and progress.    CARDIOVASCULAR A:  Septic shock from UTI, HCAP, C diff colitis - resolved.  Chronic combined CHF, HLD. P:  MAP goal >65 Continue ASA, lipitor Continue florinef hydrocort to chronic adrenal insuff dosing  RENAL A:   Hypernatremia. Hypokalemia.  P:   F/u BMET Replete K Continue Free water  GASTROINTESTINAL A:   Nutrition. Tolerating TF at goal P:   Tube feeds, held for trach otherwise well tolerated.  Pepcid for SUP  HEMATOLOGIC A:   Anemia of chronic disease and critical illness. No obvious signs of bleeding P:  F/u CBC SQ heparin for DVT prophylaxis  INFECTIOUS A:   MDR gram neg UTI HCAP with Enterobacter >> MDR. C diff colitis. P:   Completes meropenem on 8/18 Continue enteral vancomycin Trend fever, WBC, stool output  ENDOCRINE A:   Chronic adrenal insufficiency. Hypoglycemia per CBG's, not BMET P:   Florinef Stress steroids -wean as able - reduced to q12 8/16. Unclear what his final dose hydrocort should be, he was on '100mg'$  q8h on admission. Clearly baseline dose should be far less. Goal to ~2.5-5.'0mg'$  bid. Will change to per tube on 8/18 SSI   NEUROLOGIC A:   Acute encephalopathy. Hx seizures, strep pneumo meningitis, CVA. Agitated requiring increased sedation. P:   Monitor mental status Continue depakote, keppra  Increased Seroquel to 150 mg BID 8/17  Added low dose clonazepam 8/17 Avoid versed pushes as able  Wean fent gtt as able, hopefully trach placement will facilitate this Daily [facility]  FAMILY Dr Germain Koopmann met with family on 8/17. Discussed chronic illness, acute issues and status. Wife is main spokesperson. Indicated that they favor tracheostomy, full support including potential longterm care for him. Full code at this time.   . Independent critical care time is 35 minutes.   Baltazar Apo, MD, PhD 07/22/2016, 9:42 AM [facility] [phone] or if no answer [phone]

## 2016-07-23 LAB — BASIC METABOLIC PANEL
Anion gap: 8 (ref 5–15)
BUN: 16 mg/dL (ref 6–20)
CALCIUM: 8.2 mg/dL — AB (ref 8.9–10.3)
CO2: 26 mmol/L (ref 22–32)
CREATININE: 0.76 mg/dL (ref 0.61–1.24)
Chloride: 114 mmol/L — ABNORMAL HIGH (ref 101–111)
GFR calc Af Amer: >60 mL/min (ref >60)
Glucose, Bld: 79 mg/dL (ref 65–99)
Potassium: 3 mmol/L — ABNORMAL LOW (ref 3.5–5.1)
SODIUM: 148 mmol/L — AB (ref 135–145)

## 2016-07-23 LAB — GLUCOSE, CAPILLARY
GLUCOSE-CAPILLARY: 104 mg/dL — AB (ref 65–99)
GLUCOSE-CAPILLARY: 91 mg/dL (ref 65–99)
GLUCOSE-CAPILLARY: 91 mg/dL (ref 65–99)
Glucose-Capillary: 85 mg/dL (ref 65–99)
Glucose-Capillary: 82 mg/dL (ref 65–99)
Glucose-Capillary: 164 mg/dL — ABNORMAL HIGH (ref 65–99)
Glucose-Capillary: 65 mg/dL (ref 65–99)

## 2016-07-23 MED ORDER — DEXTROSE 50 % IV SOLN
1.0000 | Freq: Once | INTRAVENOUS | Status: AC
  Administered 2016-07-23: 50 mL via INTRAVENOUS

## 2016-07-23 MED ORDER — DEXTROSE 50 % IV SOLN
INTRAVENOUS | Status: AC
  Filled 2016-07-23: qty 50

## 2016-07-23 MED ORDER — DEXMEDETOMIDINE HCL IN NACL 200 MCG/50ML IV SOLN
0.0000 ug/kg/h | INTRAVENOUS | Status: AC
  Administered 2016-07-23 (×2): 0.4 ug/kg/h via INTRAVENOUS
  Administered 2016-07-24: 0.2 ug/kg/h via INTRAVENOUS
  Administered 2016-07-24: 0.7 ug/kg/h via INTRAVENOUS
  Administered 2016-07-25: 0.2 ug/kg/h via INTRAVENOUS
  Filled 2016-07-23 (×5): qty 50

## 2016-07-23 MED ORDER — DEXTROSE 5 % IV SOLN
0.0000 ug/min | INTRAVENOUS | Status: DC
  Administered 2016-07-23: 1 ug/min via INTRAVENOUS
  Filled 2016-07-23: qty 4

## 2016-07-23 MED ORDER — POTASSIUM CHLORIDE 20 MEQ/15ML (10%) PO SOLN
30.0000 meq | ORAL | Status: AC
  Administered 2016-07-23 (×2): 30 meq
  Filled 2016-07-23 (×2): qty 30

## 2016-07-23 MED ORDER — SODIUM CHLORIDE 0.9 % IV BOLUS (SEPSIS)
1000.0000 mL | Freq: Once | INTRAVENOUS | Status: AC
  Administered 2016-07-23: 1000 mL via INTRAVENOUS

## 2016-07-23 NOTE — Progress Notes (Signed)
Pt BP 60's/50's from 1130-1145.  MD notified.  New orders received for 1L bolus and restart norepinephrine if bolus not effective. Frutoso ChaseKristen M Leighanna Kirn, RN

## 2016-07-23 NOTE — Progress Notes (Signed)
Hypoglycemic Event  CBG: 65  Treatment: D50 IV 50 mL  Symptoms: None  Follow-up CBG: Time:1206 CBG Result: 164  Possible Reasons for Event: Unknown  Comments/MD notified: Dr. Delton CoombesByrum notified    Frutoso ChaseKristen M Sharran Caratachea

## 2016-07-23 NOTE — Progress Notes (Signed)
PULMONARY / CRITICAL CARE MEDICINE   Name: NASZIR COTT MRN: [id number] DOB: 1966/10/09    ADMISSION DATE:  07/12/2016 CONSULTATION DATE:  07/12/16  REFERRING MD:  APH  CHIEF COMPLAINT:  AMS  SUBJECTIVE:   Agitation better today w addition clonazepam and increased seroquel.  Still getting some prn versed fentanyl gtt running.   VITAL SIGNS: BP (!) 127/98   Pulse (!) 51   Temp 97.8 F (36.6 C)   Resp 15   Ht 5' 8" (1.727 m)   Wt 58.4 kg (128 lb 12 oz)   SpO2 100%   BMI 19.58 kg/m   HEMODYNAMICS:    VENTILATOR SETTINGS: Vent Mode: PRVC FiO2 (%):  [30 %-40 %] 40 % Set Rate:  [14 bmp] 14 bmp Vt Set:  [540 mL-550 mL] 550 mL PEEP:  [5 cmH20] 5 cmH20 Plateau Pressure:  [14 cmH20-16 cmH20] 16 cmH20  INTAKE / OUTPUT: I/O last 3 completed shifts: In: 3387.4 [I.V.:908.4; NG/GT:2079; IV [id number]:845] Out: 5080 [XMIWO:0321; Emesis/NG output:300; Stool:425]  PHYSICAL EXAMINATION: General: ill appearing, intubated on vent Neuro: RASS -1, more calm, will still move around without clear purpose when stimulated HEENT: mm moist, no JVD, ETT in place Cardiac: regular, bradycardic Chest: resps even non labored on vent, scattered rhonchi Abd: soft, non tender Ext: 1+ edema UE, 2+ LLE Skin: sacral pressure ulcer  LABS:  BMET  Recent Labs Lab 07/21/16 0427 07/22/16 0430 07/23/16 0500  NA 145 148* 148*  K 3.1* 3.0* 3.0*  CL 109 111 114*  CO2 32 28 26  BUN _0 CREATININE 0.68 0.73 0.76  GLUCOSE 89 80 79    Electrolytes  Recent Labs Lab 07/18/16 0500  07/20/16 0313 07/21/16 0427 07/21/16 1027 07/22/16 0430 07/23/16 0500  CALCIUM 8.0*  < > 8.2* 8.2*  --  8.5* 8.2*  MG 1.8  --  1.8 2.1  --   --   --   PHOS  --   --  3.2  --  3.6  --   --   < > = values in this interval not displayed.  CBC  Recent Labs Lab 07/20/16 0313 07/21/16 0427 07/22/16 0430  WBC 10.8* 9.1 11.6*  HGB 8.3* 8.3* 8.3*  HCT 26.5* 26.3* 26.7*  PLT 275 305 341     Coag's  Recent Labs Lab 07/22/16 0430  APTT 34  INR 0.96    Sepsis Markers No results for input(s): LATICACIDVEN, PROCALCITON, O2SATVEN in the last 168 hours.  ABG No results for input(s): PHART, PCO2ART, PO2ART in the last 168 hours.  Liver Enzymes No results for input(s): AST, ALT, ALKPHOS, BILITOT, ALBUMIN in the last 168 hours.  Cardiac Enzymes No results for input(s): TROPONINI, PROBNP in the last 168 hours.  Glucose  Recent Labs Lab 07/22/16 1204 07/22/16 1627 07/22/16 2003 07/23/16 0007 07/23/16 0319 07/23/16 0733  GLUCAP 119* 93 89 91 85 82    Imaging Dg Chest Port 1 View  Result Date: 07/22/2016 CLINICAL DATA:  Tracheostomy placement EXAM: PORTABLE CHEST 1 VIEW COMPARISON:  July 21, 2016 FINDINGS: A tracheostomy is now present with the tip 5.2 cm above the carina. Central catheter tip is in the superior vena cava. No pneumothorax. There is left lower lobe consolidation with left effusion. There is a smaller right effusion. Right lung is otherwise clear. Heart size and pulmonary vascularity are normal. No adenopathy. IMPRESSION: Tracheostomy tip 5.2 cm above the carina. Central catheter tip in superior vena cava. No pneumothorax. Persistent left lower  lobe consolidation with left effusion. Small right effusion present. Stable cardiac silhouette. Electronically Signed   By: Lowella Grip III M.D.   On: 07/22/2016 12:38   Dg Abd Portable 1v  Result Date: 07/22/2016 CLINICAL DATA:  Feeding tube placement EXAM: PORTABLE ABDOMEN - 1 VIEW COMPARISON:  None. FINDINGS: Weighted feeding tube terminates in the distal 4th portion of the duodenum near the ligament of Treitz. Operative bowel gas pattern. Visualized osseous structures are within normal limits. IMPRESSION: Weighted feeding tube terminates near the ligament of Treitz. Electronically Signed   By: Julian Hy M.D.   On: 07/22/2016 17:05    STUDIES:  CT head 8/8 >> old b/l infarcts with subacute  infarct again identified at inferior left caudate.  No acute process. EEG 8/10 >>suppressed backround MRI brain 8/11 >> multifocal acute Lt MCA ischemic infarcts, possible infarct Rt caudate head  CULTURES: 8/08 Urine >> Enterobacter 8/08 Blood >> NEG 8/09 Sputum >> MDR Enterobacter 8/09 Blood >> NEG 8/12 C diff >> antigen and toxin positive  ANTIBIOTICS: Vanc 8/8 >>8/10 Zosyn 8/8 >>8/11 meropenem 8/11>> 8/18 Oral vancomycin 8/12 >>  SIGNIFICANT EVENTS: 8/08 admitted with unresponsiveness, required intubation. 8/09 low cvp, levophed, possible seizure  8/10 not moving rt ext upper and lowers 8/13 Following commands 8/15>> Following Commands, but agitated., Norepi off 8/15 am.  LINES/TUBES: ETT 8/8 > 8/18 Trach (JY) 8/18 >>  R IJ CVL 8/8 >  DISCUSSION: 50 yo male from NH with frequent admissions admitted again 8/18 with altered mental status, inability to protect airway.  He was hypotensive and hypothermic from UTI. Acute resp failure and severe delirium. S/p Trach on 8/18.   He has PMHx of combined CHF, Streptococcal meningitis, ETOH, Depression, Seizures, Dementia, CVA.  ASSESSMENT / PLAN:  PULMONARY A: Acute hypoxic respiratory failure - due to inability to protect airway. 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 P:   Vent support - 8cc/kg  Would like to push for PSV, suspect he would tolerate if not requiring so much sedation to stay in the bed   CARDIOVASCULAR A:  Septic shock from UTI, HCAP, C diff colitis - resolved.  Chronic combined CHF, HLD. P:  MAP goal >65 Continue ASA, lipitor Continue florinef hydrocort to chronic adrenal insuff dosing  RENAL A:   Hypernatremia. Hypokalemia.  P:   F/u BMET Replete K Continue Free water  GASTROINTESTINAL A:   Nutrition. Tolerating TF at goal P:   Tube feeds Pepcid for SUP  HEMATOLOGIC A:   Anemia of chronic disease and critical illness. No obvious signs of bleeding P:  F/u CBC SQ heparin  for DVT prophylaxis  INFECTIOUS A:   MDR gram neg UTI HCAP with Enterobacter >> MDR. C diff colitis. P:   Completed meropenem on 8/18 Continue enteral vancomycin Trend fever, WBC, stool output  ENDOCRINE A:   Chronic adrenal insufficiency. Hypoglycemia per CBG's, not BMET P:   Florinef Stress steroids -wean as able - reduced to q12 8/16. Unclear what his final dose hydrocort should be, he was on 150m q8h on admission. Clearly baseline dose should be far less. Goal to ~2.5-5.032mbid. Changed to per tube on 8/18 SSI   NEUROLOGIC A:   Acute encephalopathy. Hx seizures, strep pneumo meningitis, CVA. Agitated requiring increased sedation. P:   Monitor mental status Continue depakote, keppra  Increased Seroquel to 150 mg BID 8/17  Added low dose clonazepam 8/17 Will add precedex on 8/19. Check ECG once on it to assess QT-c Hopefully will be  able to wean fentanyl and push for SBT's on the precedex.  Daily [facility]  FAMILY Dr Lamonte Sakai met with family on 8/17. Discussed chronic illness, acute issues and status. Wife is main spokesperson. Indicated that they favor tracheostomy, full support including potential longterm care for him. Full code at this time.   Independent critical care time is 34 minutes.   Baltazar Apo, MD, PhD 07/23/2016, 9:52 AM [facility] [phone] or if no answer [phone]

## 2016-07-23 NOTE — Progress Notes (Signed)
[facility]OC[facility] FOR AM LAB REPLACEMENT ONLY  The [facility] does [facility] based on the criteria listed below:   1. Is GFR >/= 40 ml/min? Yes.    Patient's GFR today is >60 2. Is urine output >/= 0.5 ml/kg/hr for the last 6 hours? Yes.   Patient's UOP is 0.7 ml/kg/hr 3. Is BUN < 60 mg/dL? Yes.    Patient's BUN today is 16 4. Abnormal electrolyte(s): K+3.0 5. Ordered repletion with: protocol 6. If a panic level lab has been reported, has the CCM MD in charge been notified? No..   Physician:  Adrian BlackwaterRamaswamy  Betti Goodenow Hilliard 07/23/2016 6:32 AM

## 2016-07-23 NOTE — Plan of Care (Signed)
Problem: Physical Regulation: Goal: Ability to maintain clinical measurements within normal limits will improve Outcome: Not Progressing Requiring low dose levophed for BP support

## 2016-07-23 NOTE — Plan of Care (Signed)
Problem: Respiratory: Goal: Ability to maintain a clear airway and adequate ventilation will improve Outcome: Progressing Pt is trach'd, full vent support at this time

## 2016-07-24 LAB — BASIC METABOLIC PANEL
Anion gap: 9 (ref 5–15)
BUN: 14 mg/dL (ref 6–20)
CALCIUM: 8.2 mg/dL — AB (ref 8.9–10.3)
CO2: 26 mmol/L (ref 22–32)
CREATININE: 0.73 mg/dL (ref 0.61–1.24)
Chloride: 111 mmol/L (ref 101–111)
Glucose, Bld: 89 mg/dL (ref 65–99)
Potassium: 3.3 mmol/L — ABNORMAL LOW (ref 3.5–5.1)
SODIUM: 146 mmol/L — AB (ref 135–145)

## 2016-07-24 LAB — GLUCOSE, CAPILLARY
GLUCOSE-CAPILLARY: 102 mg/dL — AB (ref 65–99)
GLUCOSE-CAPILLARY: 81 mg/dL (ref 65–99)
GLUCOSE-CAPILLARY: 93 mg/dL (ref 65–99)
GLUCOSE-CAPILLARY: 95 mg/dL (ref 65–99)
Glucose-Capillary: 96 mg/dL (ref 65–99)
Glucose-Capillary: 94 mg/dL (ref 65–99)

## 2016-07-24 MED ORDER — QUETIAPINE FUMARATE 100 MG PO TABS
100.0000 mg | ORAL_TABLET | Freq: Two times a day (BID) | ORAL | Status: DC
  Administered 2016-07-24 – 2016-08-04 (×22): 100 mg
  Filled 2016-07-24 (×22): qty 1

## 2016-07-24 MED ORDER — POTASSIUM CHLORIDE 20 MEQ/15ML (10%) PO SOLN
30.0000 meq | ORAL | Status: AC
  Administered 2016-07-24 (×2): 30 meq
  Filled 2016-07-24 (×2): qty 30

## 2016-07-24 NOTE — Progress Notes (Signed)
PULMONARY / CRITICAL CARE MEDICINE   Name: Dale [name] MRN: [id number] DOB: [birth year]    ADMISSION DATE:  07/12/2016 CONSULTATION DATE:  07/12/16  REFERRING MD:  APH  CHIEF COMPLAINT:  AMS  SUBJECTIVE:   Precedex added 8/19 > he has done well with it, fentanyl off AM 8/20 he is quite sedated   VITAL SIGNS: BP 114/78   Pulse 61   Temp 99.5 F (37.5 C) (Oral)   Resp 14   Ht '5\' 8"'$  (1.727 m)   Wt 57 kg (125 lb 10.6 oz)   SpO2 100%   BMI 19.11 kg/m   HEMODYNAMICS:    VENTILATOR SETTINGS: Vent Mode: PRVC FiO2 (%):  [40 %] 40 % Set Rate:  [14 bmp] 14 bmp Vt Set:  [550 mL] 550 mL PEEP:  [5 cmH20] 5 cmH20 Plateau Pressure:  [13 cmH20-20 cmH20] 13 cmH20  INTAKE / OUTPUT: I/O last 3 completed shifts: In: 5966.9 [I.V.:694.9; JJ/HE:1740; IV [id number]:3149] Out: 7026 [Urine:4755; Stool:200]  PHYSICAL EXAMINATION: General: ill appearing, intubated on vent Neuro: RASS -1, more calm, will still move around without clear purpose when stimulated HEENT: mm moist, no JVD, ETT in place Cardiac: regular, bradycardic Chest: resps even non labored on vent, scattered rhonchi Abd: soft, non tender Ext: 1+ edema UE, 2+ LLE Skin: sacral pressure ulcer  LABS:  BMET  Recent Labs Lab 07/22/16 0430 07/23/16 0500 07/24/16 0450  NA 148* 148* 146*  K 3.0* 3.0* 3.3*  CL 111 114* 111  CO2 '28 26 26  '$ BUN '13 16 14  '$ CREATININE 0.73 0.76 0.73  GLUCOSE 80 79 89    Electrolytes  Recent Labs Lab 07/18/16 0500  07/20/16 0313 07/21/16 0427 07/21/16 1027 07/22/16 0430 07/23/16 0500 07/24/16 0450  CALCIUM 8.0*  < > 8.2* 8.2*  --  8.5* 8.2* 8.2*  MG 1.8  --  1.8 2.1  --   --   --   --   PHOS  --   --  3.2  --  3.6  --   --   --   < > = values in this interval not displayed.  CBC  Recent Labs Lab 07/20/16 0313 07/21/16 0427 07/22/16 0430  WBC 10.8* 9.1 11.6*  HGB 8.3* 8.3* 8.3*  HCT 26.5* 26.3* 26.7*  PLT 275 305 341    Coag's  Recent Labs Lab 07/22/16 0430  APTT  34  INR 0.96    Sepsis Markers No results for input(s): LATICACIDVEN, PROCALCITON, O2SATVEN in the last 168 hours.  ABG No results for input(s): PHART, PCO2ART, PO2ART in the last 168 hours.  Liver Enzymes No results for input(s): AST, ALT, ALKPHOS, BILITOT, ALBUMIN in the last 168 hours.  Cardiac Enzymes No results for input(s): TROPONINI, PROBNP in the last 168 hours.  Glucose  Recent Labs Lab 07/23/16 1137 07/23/16 1206 07/23/16 1550 07/23/16 2013 07/24/16 0010 07/24/16 0438  GLUCAP 65 164* 104* 91 95 96    Imaging No results found.  STUDIES:  CT head 8/8 >> old b/l infarcts with subacute infarct again identified at inferior left caudate.  No acute process. EEG 8/10 >>suppressed backround MRI brain 8/11 >> multifocal acute Lt MCA ischemic infarcts, possible infarct Rt caudate head  CULTURES: 8/08 Urine >> Enterobacter 8/08 Blood >> NEG 8/09 Sputum >> MDR Enterobacter 8/09 Blood >> NEG 8/12 C diff >> antigen and toxin positive  ANTIBIOTICS: Vanc 8/8 >>8/10 Zosyn 8/8 >>8/11 meropenem 8/11>> 8/18 Oral vancomycin 8/12 >>  SIGNIFICANT EVENTS: 8/08 admitted with unresponsiveness,  required intubation. 8/09 low cvp, levophed, possible seizure  8/10 not moving rt ext upper and lowers 8/13 Following commands 8/15>> Following Commands, but agitated., Norepi off 8/15 am.  LINES/TUBES: ETT 8/8 > 8/18 Trach (JY) 8/18 >>  R IJ CVL 8/8 >  DISCUSSION: 50 yo male from NH with frequent admissions admitted again 8/18 with altered mental status, inability to protect airway.  He was hypotensive and hypothermic from UTI. Acute resp failure and severe delirium. S/p Trach on 8/18.   He has PMHx of combined CHF, Streptococcal meningitis, ETOH, Depression, Seizures, Dementia, CVA.  ASSESSMENT / PLAN:  PULMONARY A: Acute hypoxic respiratory failure - due to inability to protect airway. 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 P:   Vent support - 8cc/kg  Would  like to push for PSV, currently too sedated   CARDIOVASCULAR A:  Septic shock from UTI, HCAP, C diff colitis - resolved.  Chronic combined CHF, HLD. P:  MAP goal >65 Continue ASA, lipitor Continue florinef hydrocort to chronic adrenal insuff dosing Wean norepi to off   RENAL A:   Hypernatremia. Hypokalemia.  P:   F/u BMET Replete K Continue Free water  GASTROINTESTINAL A:   Nutrition. Tolerating TF at goal P:   Tube feeds Pepcid for SUP  HEMATOLOGIC A:   Anemia of chronic disease and critical illness. No obvious signs of bleeding P:  F/u CBC SQ heparin for DVT prophylaxis  INFECTIOUS A:   MDR gram neg UTI HCAP with Enterobacter >> MDR. C diff colitis. P:   Completed meropenem on 8/18 Continue enteral vancomycin Trend fever, WBC, stool output  ENDOCRINE A:   Chronic adrenal insufficiency. Hypoglycemia per CBG's, not BMET P:   Florinef Stress steroids -wean as able - reduced to q12 8/16. Unclear what his final dose hydrocort should be, he was on '100mg'$  q8h on admission. Clearly baseline dose should be far less. Goal to ~2.5-5.'0mg'$  bid. Changed to per tube on 8/18 SSI   NEUROLOGIC A:   Acute encephalopathy. Hx seizures, strep pneumo meningitis, CVA. Agitated requiring increased sedation > now has developed oversedation 8/20 P:   Monitor mental status Continue depakote, keppra  Decrease Seroquel to 100 mg BID 8/20 D/c clonazepam on 8/20 Continue precedex Daily WUA  FAMILY Dr Lamonte Sakai met with family on 8/17. Discussed chronic illness, acute issues and status. Wife is main spokesperson. Indicated that they favor tracheostomy, full support including potential longterm care for him. Full code at this time.   Independent critical care time is 32 minutes.   Baltazar Apo, MD, PhD 07/24/2016, 8:22 AM [facility] [phone] or if no answer [phone]

## 2016-07-25 DIAGNOSIS — Z930 Tracheostomy status: Secondary | ICD-10-CM

## 2016-07-25 LAB — CBC
HEMATOCRIT: 23.8 % — AB (ref 39.0–52.0)
HEMOGLOBIN: 7.4 g/dL — AB (ref 13.0–17.0)
MCH: 29.1 pg (ref 26.0–34.0)
MCHC: 31.1 g/dL (ref 30.0–36.0)
MCV: 93.7 fL (ref 78.0–100.0)
Platelets: 304 10*3/uL (ref 150–400)
RBC: 2.54 MIL/uL — ABNORMAL LOW (ref 4.22–5.81)
RDW: 19.6 % — AB (ref 11.5–15.5)
WBC: 11 10*3/uL — ABNORMAL HIGH (ref 4.0–10.5)

## 2016-07-25 LAB — BASIC METABOLIC PANEL
Anion gap: 5 (ref 5–15)
BUN: 15 mg/dL (ref 6–20)
CALCIUM: 8.3 mg/dL — AB (ref 8.9–10.3)
CHLORIDE: 112 mmol/L — AB (ref 101–111)
CO2: 25 mmol/L (ref 22–32)
CREATININE: 0.61 mg/dL (ref 0.61–1.24)
GFR calc Af Amer: >60 mL/min (ref >60)
GFR calc non Af Amer: >60 mL/min (ref >60)
GLUCOSE: 113 mg/dL — AB (ref 65–99)
Potassium: 3.2 mmol/L — ABNORMAL LOW (ref 3.5–5.1)
Sodium: 142 mmol/L (ref 135–145)

## 2016-07-25 LAB — GLUCOSE, CAPILLARY
GLUCOSE-CAPILLARY: 83 mg/dL (ref 65–99)
GLUCOSE-CAPILLARY: 71 mg/dL (ref 65–99)
Glucose-Capillary: 101 mg/dL — ABNORMAL HIGH (ref 65–99)
Glucose-Capillary: 108 mg/dL — ABNORMAL HIGH (ref 65–99)
Glucose-Capillary: 79 mg/dL (ref 65–99)
Glucose-Capillary: 87 mg/dL (ref 65–99)

## 2016-07-25 LAB — MAGNESIUM: Magnesium: 1.8 mg/dL (ref 1.7–2.4)

## 2016-07-25 LAB — PHOSPHORUS: Phosphorus: 4 mg/dL (ref 2.5–4.6)

## 2016-07-25 MED ORDER — POTASSIUM CHLORIDE 20 MEQ/15ML (10%) PO SOLN
30.0000 meq | ORAL | Status: AC
  Administered 2016-07-25 (×2): 30 meq
  Filled 2016-07-25 (×2): qty 30

## 2016-07-25 MED ORDER — MAGNESIUM SULFATE 2 GM/50ML IV SOLN
2.0000 g | Freq: Once | INTRAVENOUS | Status: AC
  Administered 2016-07-25: 2 g via INTRAVENOUS
  Filled 2016-07-25: qty 50

## 2016-07-25 MED ORDER — POTASSIUM CHLORIDE 20 MEQ/15ML (10%) PO SOLN
40.0000 meq | Freq: Three times a day (TID) | ORAL | Status: AC
  Administered 2016-07-25 (×2): 40 meq
  Filled 2016-07-25 (×2): qty 30

## 2016-07-25 NOTE — Progress Notes (Signed)
[facility] REPLAC[facility] ONLY  The patient does [facility] Protocol based on the criteria listed below:   1. Is GFR >/= 40 ml/min? Yes.    Patient's GFR today is >60 2. Is urine output >/= 0.5 ml/kg/hr for the last 6 hours? Yes.   Patient's UOP is 2.0 ml/kg/hr 3. Is BUN < 60 mg/dL? Yes.    Patient's BUN today is 15 4. Abnormal electrolyte(s): K+ 3.2 5. Mg 1.8 Ordered repletion with: protocol 6. If a panic level lab has been reported, has the CCM MD in charge been notified? No..   Physician:  Dale [name]  Dale [name] 07/25/2016 5:41 AM

## 2016-07-25 NOTE — Progress Notes (Signed)
PULMONARY / CRITICAL CARE MEDICINE   Name: Dale HollowCraig D Meinhardt MRN: [id number] DOB: 11/14/1966    ADMISSION DATE:  07/12/2016 CONSULTATION DATE:  07/12/16  REFERRING MD:  APH  CHIEF COMPLAINT:  AMS  SUBJECTIVE:   Precedex added 8/19 > he has done well with it, fentanyl off AM 8/20 he is quite sedated   VITAL SIGNS: BP 97/73   Pulse (!) 102   Temp 99.4 F (37.4 C) (Oral)   Resp 19   Ht 5\' 8"  (1.727 m)   Wt 55.8 kg (123 lb 0.3 oz)   SpO2 100%   BMI 18.70 kg/m   HEMODYNAMICS:    VENTILATOR SETTINGS: Vent Mode: PRVC FiO2 (%):  [40 %] 40 % Set Rate:  [14 bmp] 14 bmp Vt Set:  [550 mL] 550 mL PEEP:  [5 cmH20] 5 cmH20 Plateau Pressure:  [13 cmH20-19 cmH20] 14 cmH20  INTAKE / OUTPUT: I/O last 3 completed shifts: In: 4068.7 [I.V.:268.7; NG/GT:3750; IV Piggyback:50] Out: 5580 [Urine:4880; Stool:700]  PHYSICAL EXAMINATION: General: ill appearing, trached on vent Neuro: RASS 0, more calm, will still move around without clear purpose when stimulated HEENT: mm moist, no JVD, ETT in place Cardiac: regular, bradycardic Chest: resps even non labored on vent, scattered rhonchi Abd: soft, non tender Ext: 1+ edema UE, 2+ LLE Skin: sacral pressure ulcer  LABS:  BMET  Recent Labs Lab 07/23/16 0500 07/24/16 0450 07/25/16 0420  NA 148* 146* 142  K 3.0* 3.3* 3.2*  CL 114* 111 112*  CO2 26 26 25   BUN 16 14 15   CREATININE 0.76 0.73 0.61  GLUCOSE 79 89 113*   Electrolytes  Recent Labs Lab 07/20/16 0313 07/21/16 0427 07/21/16 1027  07/23/16 0500 07/24/16 0450 07/25/16 0420  CALCIUM 8.2* 8.2*  --   < > 8.2* 8.2* 8.3*  MG 1.8 2.1  --   --   --   --  1.8  PHOS 3.2  --  3.6  --   --   --  4.0  < > = values in this interval not displayed.  CBC  Recent Labs Lab 07/21/16 0427 07/22/16 0430 07/25/16 0420  WBC 9.1 11.6* 11.0*  HGB 8.3* 8.3* 7.4*  HCT 26.3* 26.7* 23.8*  PLT 305 341 304    Coag's  Recent Labs Lab 07/22/16 0430  APTT 34  INR 0.96    Sepsis  Markers No results for input(s): LATICACIDVEN, PROCALCITON, O2SATVEN in the last 168 hours.  ABG No results for input(s): PHART, PCO2ART, PO2ART in the last 168 hours.  Liver Enzymes No results for input(s): AST, ALT, ALKPHOS, BILITOT, ALBUMIN in the last 168 hours.  Cardiac Enzymes No results for input(s): TROPONINI, PROBNP in the last 168 hours.  Glucose  Recent Labs Lab 07/24/16 1157 07/24/16 1551 07/24/16 1951 07/25/16 0020 07/25/16 0415 07/25/16 0752  GLUCAP 102* 94 81 108* 101* 79    Imaging No results found.  STUDIES:  CT head 8/8 >> old b/l infarcts with subacute infarct again identified at inferior left caudate.  No acute process. EEG 8/10 >>suppressed backround MRI brain 8/11 >> multifocal acute Lt MCA ischemic infarcts, possible infarct Rt caudate head  CULTURES: 8/08 Urine >> Enterobacter 8/08 Blood >> NEG 8/09 Sputum >> MDR Enterobacter 8/09 Blood >> NEG 8/12 C diff >> antigen and toxin positive  ANTIBIOTICS: Vanc 8/8 >>8/10 Zosyn 8/8 >>8/11 meropenem 8/11>> 8/18 Oral vancomycin 8/12 >>  SIGNIFICANT EVENTS: 8/08 admitted with unresponsiveness, required intubation. 8/09 low cvp, levophed, possible seizure  8/10 not  moving rt ext upper and lowers 8/13 Following commands 8/15>> Following Commands, but agitated., Norepi off 8/15 am.  LINES/TUBES: ETT 8/8 > 8/18 Trach (JY) 8/18 >>  R IJ CVL 8/8 >  DISCUSSION: 50 yo male from NH with frequent admissions admitted again 8/18 with altered mental status, inability to protect airway.  He was hypotensive and hypothermic from UTI. Acute resp failure and severe delirium. S/p Trach on 8/18.   He has PMHx of combined CHF, Streptococcal meningitis, ETOH, Depression, Seizures, Dementia, CVA.  ASSESSMENT / PLAN:  PULMONARY A: Acute hypoxic respiratory failure - due to inability to protect airway. 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 P:   PS as tolerated. [facility] Minimize  sedation.  CARDIOVASCULAR A:  Septic shock from UTI, HCAP, C diff colitis - resolved.  Chronic combined CHF, HLD. P:  MAP goal >65 Continue ASA, lipitor Continue florinef Hydrocort to chronic adrenal insuff dosing Wean norepi to off   RENAL A:   Hypernatremia. Hypokalemia.  P:   F/u BMET Replete electrolytes as indicated. Continue Free water  GASTROINTESTINAL A:   Nutrition. Tolerating TF at goal P:   Tube feeds Pepcid for SUP Free water 400 ml q6.  HEMATOLOGIC A:   Anemia of chronic disease and critical illness. No obvious signs of bleeding P:  F/u CBC SQ heparin for DVT prophylaxis  INFECTIOUS A:   MDR gram neg UTI HCAP with Enterobacter >> MDR. C diff colitis. P:   Completed meropenem on 8/18 Continue enteral vancomycin Trend fever, WBC, stool output  ENDOCRINE A:   Chronic adrenal insufficiency. Hypoglycemia per CBG's, not BMET P:   Florinef Stress steroids -wean as able - reduced to q12 8/16. Unclear what his final dose hydrocort should be, he was on 100mg  q8h on admission. Clearly baseline dose should be far less. Goal to ~2.5-5.0mg  bid. Changed to per tube on 8/18 SSI   NEUROLOGIC A:   Acute encephalopathy. Hx seizures, strep pneumo meningitis, CVA. Agitated requiring increased sedation > now has developed oversedation 8/20 P:   Monitor mental status Continue depakote, keppra  Decrease Seroquel to 100 mg BID 8/20 D/c clonazepam on 8/20 Continue precedex, currently off. Daily WUA  FAMILY No family bedside 8/21.  The patient is critically ill with multiple organ systems failure and requires high complexity decision making for assessment and support, frequent evaluation and titration of therapies, application of advanced monitoring technologies and extensive interpretation of multiple databases.   Critical Care Time devoted to patient care services described in this note is  35  Minutes. This time reflects time of care of this signee Dr  Koren BoundWesam Masiel Gentzler. This critical care time does not reflect procedure time, or teaching time or supervisory time of PA/NP/Med student/Med Resident etc but could involve care discussion time.  Alyson ReedyWesam G. Ader Fritze, M.D. [facility] Pulmonary/Critical Care Medicine. Pager: [phone]. After hours pager: [phone].

## 2016-07-25 NOTE — Progress Notes (Signed)
200 mL of fentanyl was wasted in the sink. Witnessed by CenterPoint EnergyJesse RN

## 2016-07-26 ENCOUNTER — Inpatient Hospital Stay (HOSPITAL_COMMUNITY): Payer: Medicaid Other

## 2016-07-26 LAB — GLUCOSE, CAPILLARY
GLUCOSE-CAPILLARY: 92 mg/dL (ref 65–99)
Glucose-Capillary: 71 mg/dL (ref 65–99)
Glucose-Capillary: 81 mg/dL (ref 65–99)
Glucose-Capillary: 107 mg/dL — ABNORMAL HIGH (ref 65–99)
Glucose-Capillary: 79 mg/dL (ref 65–99)

## 2016-07-26 LAB — BASIC METABOLIC PANEL
ANION GAP: 7 (ref 5–15)
BUN: 14 mg/dL (ref 6–20)
CALCIUM: 8.2 mg/dL — AB (ref 8.9–10.3)
CO2: 25 mmol/L (ref 22–32)
Chloride: 110 mmol/L (ref 101–111)
Creatinine, Ser: 0.64 mg/dL (ref 0.61–1.24)
Glucose, Bld: 87 mg/dL (ref 65–99)
Potassium: 4 mmol/L (ref 3.5–5.1)
Sodium: 142 mmol/L (ref 135–145)

## 2016-07-26 LAB — CBC
HCT: 23.9 % — ABNORMAL LOW (ref 39.0–52.0)
Hemoglobin: 7.6 g/dL — ABNORMAL LOW (ref 13.0–17.0)
MCH: 29.6 pg (ref 26.0–34.0)
MCHC: 31.8 g/dL (ref 30.0–36.0)
MCV: 93 fL (ref 78.0–100.0)
Platelets: 305 10*3/uL (ref 150–400)
RBC: 2.57 MIL/uL — AB (ref 4.22–5.81)
RDW: 19.2 % — ABNORMAL HIGH (ref 11.5–15.5)
WBC: 10.7 10*3/uL — AB (ref 4.0–10.5)

## 2016-07-26 LAB — PHOSPHORUS: PHOSPHORUS: 4 mg/dL (ref 2.5–4.6)

## 2016-07-26 LAB — MAGNESIUM: Magnesium: 2.4 mg/dL (ref 1.7–2.4)

## 2016-07-26 NOTE — Progress Notes (Signed)
PULMONARY / CRITICAL CARE MEDICINE   Name: Dale [name] MRN: [id number] DOB: 07/11/1966    ADMISSION DATE:  07/12/2016 CONSULTATION DATE:  07/12/16  REFERRING MD:  [facility]  CHIEF COMPLAINT:  AMS  SUBJECTIVE:   No events overnight, follows simple commands.  VITAL SIGNS: BP 102/77   Pulse 68   Temp 97.9 F (36.6 C) (Oral)   Resp 14   Ht 5\' 8"  (1.727 m)   Wt 55.7 kg (122 lb 12.7 oz)   SpO2 100%   BMI 18.67 kg/m   HEMODYNAMICS:    VENTILATOR SETTINGS: Vent Mode: CPAP;PSV FiO2 (%):  [40 %] 40 % Set Rate:  [14 bmp] 14 bmp Vt Set:  [550 mL] 550 mL PEEP:  [5 cmH20] 5 cmH20 Pressure Support:  [10 cmH20] 10 cmH20 Plateau Pressure:  [8 cmH20-15 cmH20] 15 cmH20  INTAKE / OUTPUT: I/O last 3 completed shifts: In: 4246.1 [I.V.:386.1; NG/GT:3760; IV Piggyback:100] Out: 5060 [Urine:4560; Stool:500]  PHYSICAL EXAMINATION: General: ill appearing, trached on vent, weaning. Neuro: RASS 0, more calm, follows basic commands HEENT: mm moist, no JVD, ETT in place Cardiac: regular, bradycardic. Chest: resps even non labored on vent, scattered rhonchi. Abd: soft, non tender. Ext: 1+ edema UE, 2+ LLE. Skin: sacral pressure ulcer.  LABS:  BMET  Recent Labs Lab 07/24/16 0450 07/25/16 0420 07/26/16 0536  NA 146* 142 142  K 3.3* 3.2* 4.0  CL 111 112* 110  CO2 26 25 25   BUN 14 15 14   CREATININE 0.73 0.61 0.64  GLUCOSE 89 113* 87   Electrolytes  Recent Labs Lab 07/21/16 0427 07/21/16 1027  07/24/16 0450 07/25/16 0420 07/26/16 0536  CALCIUM 8.2*  --   < > 8.2* 8.3* 8.2*  MG 2.1  --   --   --  1.8 2.4  PHOS  --  3.6  --   --  4.0 4.0  < > = values in this interval not displayed.  CBC  Recent Labs Lab 07/22/16 0430 07/25/16 0420 07/26/16 0536  WBC 11.6* 11.0* 10.7*  HGB 8.3* 7.4* 7.6*  HCT 26.7* 23.8* 23.9*  PLT 341 304 305   Coag's  Recent Labs Lab 07/22/16 0430  APTT 34  INR 0.96   Sepsis Markers No results for input(s): LATICACIDVEN, PROCALCITON,  O2SATVEN in the last 168 hours.  ABG No results for input(s): PHART, PCO2ART, PO2ART in the last 168 hours.  Liver Enzymes No results for input(s): AST, ALT, ALKPHOS, BILITOT, ALBUMIN in the last 168 hours.  Cardiac Enzymes No results for input(s): TROPONINI, PROBNP in the last 168 hours.  Glucose  Recent Labs Lab 07/25/16 1244 07/25/16 1602 07/25/16 2004 07/25/16 2356 07/26/16 0311 07/26/16 0730  GLUCAP 83 71 87 92 71 81   Imaging Dg Abd Portable 1v  Result Date: 07/26/2016 CLINICAL DATA:  Feeding tube placement. EXAM: PORTABLE ABDOMEN - 1 VIEW COMPARISON:  07/22/2016. FINDINGS: Soft feeding tube has its tip at the gastric antrum. Gas pattern is unremarkable. IMPRESSION: Feeding tube tip at the gastric antrum. Electronically Signed   By: Paulina FusiMark  Shogry M.D.   On: 07/26/2016 10:23   STUDIES:  CT head 8/8 >> old b/l infarcts with subacute infarct again identified at inferior left caudate.  No acute process. EEG 8/10 >>suppressed backround MRI brain 8/11 >> multifocal acute Lt MCA ischemic infarcts, possible infarct Rt caudate head  CULTURES: 8/08 Urine >> Enterobacter 8/08 Blood >> NEG 8/09 Sputum >> MDR Enterobacter 8/09 Blood >> NEG 8/12 C diff >> antigen and toxin  positive  ANTIBIOTICS: Vanc 8/8 >>8/10 Zosyn 8/8 >>8/11 meropenem 8/11>> 8/18 Oral vancomycin 8/12 >>  SIGNIFICANT EVENTS: 8/08 admitted with unresponsiveness, required intubation. 8/09 low cvp, levophed, possible seizure  8/10 not moving rt ext upper and lowers 8/13 Following commands 8/15>> Following Commands, but agitated., Norepi off 8/15 am.  LINES/TUBES: ETT 8/8 > 8/18 Trach (JY) 8/18 >>  R IJ CVL 8/8 >  DISCUSSION: 50 yo male from NH with frequent admissions admitted again 8/18 with altered mental status, inability to protect airway.  He was hypotensive and hypothermic from UTI. Acute resp failure and severe delirium. S/p Trach on 8/18.   He has PMHx of combined CHF, Streptococcal  meningitis, ETOH, Depression, Seizures, Dementia, CVA.  ASSESSMENT / PLAN:  PULMONARY A: Acute hypoxic respiratory failure - due to inability to protect airway. 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 P:   PS as tolerated. Will need a vent SNF. Minimize sedation.  CARDIOVASCULAR A:  Septic shock from UTI, HCAP, C diff colitis - resolved.  Chronic combined CHF, HLD. P:  MAP goal >65. Continue ASA, lipitor. Continue florinef. Hydrocort to chronic adrenal insuff dosing. Wean norepi to off.  RENAL A:   Hypernatremia. Hypokalemia.  P:   F/u BMET. Replete electrolytes as indicated. Continue Free water.  GASTROINTESTINAL A:   Nutrition. Tolerating TF at goal P:   Tube feeds. Pepcid for SUP. Free water 400 ml q6.  HEMATOLOGIC A:   Anemia of chronic disease and critical illness. No obvious signs of bleeding P:  F/u CBC. SQ heparin for DVT prophylaxis.  INFECTIOUS A:   MDR gram neg UTI HCAP with Enterobacter >> MDR. C diff colitis. P:   Completed meropenem on 8/18. Continue enteral vancomycin. Trend fever, WBC, stool output.  ENDOCRINE A:   Chronic adrenal insufficiency. Hypoglycemia per CBG's, not BMET P:   Florinef. Stress steroids -wean as able - reduced to q12 8/16. Unclear what his final dose hydrocort should be, he was on 100mg  q8h on admission. Clearly baseline dose should be far less. Goal to ~2.5-5.0mg  bid. Changed to per tube on 8/18. SSI.  NEUROLOGIC A:   Acute encephalopathy. Hx seizures, strep pneumo meningitis, CVA. Agitated requiring increased sedation > now has developed oversedation 8/20 P:   Monitor mental status Continue depakote, keppra  Decreaseed Seroquel to 100 mg BID 8/20 D/c clonazepam on 8/20 D/C precedex. Daily WUA  FAMILY No family bedside 8/21.  Spoke with case management, will need vent SNF.  The patient is critically ill with multiple organ systems failure and requires high complexity decision making  for assessment and support, frequent evaluation and titration of therapies, application of advanced monitoring technologies and extensive interpretation of multiple databases.   Critical Care Time devoted to patient care services described in this note is  35  Minutes. This time reflects time of care of this signee Dr Koren BoundWesam Yacoub. This critical care time does not reflect procedure time, or teaching time or supervisory time of PA/NP/Med student/Med Resident etc but could involve care discussion time.  Alyson ReedyWesam G. Yacoub, M.D. [facility] Pulmonary/Critical Care Medicine. Pager: [phone]. After hours pager: [phone].

## 2016-07-26 NOTE — Progress Notes (Addendum)
Pt has pulled out Cortrak.  Tube advanced, meds held until placement is confirmed. STAT ABD XR ordered.

## 2016-07-26 NOTE — Progress Notes (Addendum)
Pt has pulled out Cortrak again.  Tube advanced, meds held until placement is confirmed.  No changes in lung sounds. STAT ABD XR ordered again.

## 2016-07-27 ENCOUNTER — Inpatient Hospital Stay (HOSPITAL_COMMUNITY): Payer: Medicaid Other

## 2016-07-27 ENCOUNTER — Encounter (HOSPITAL_COMMUNITY): Payer: Self-pay | Admitting: General Surgery

## 2016-07-27 DIAGNOSIS — R6521 Severe sepsis with septic shock: Secondary | ICD-10-CM

## 2016-07-27 DIAGNOSIS — A419 Sepsis, unspecified organism: Principal | ICD-10-CM

## 2016-07-27 DIAGNOSIS — J690 Pneumonitis due to inhalation of food and vomit: Secondary | ICD-10-CM

## 2016-07-27 LAB — BASIC METABOLIC PANEL
ANION GAP: 8 (ref 5–15)
BUN: 17 mg/dL (ref 6–20)
CHLORIDE: 105 mmol/L (ref 101–111)
CO2: 26 mmol/L (ref 22–32)
Calcium: 8.2 mg/dL — ABNORMAL LOW (ref 8.9–10.3)
Creatinine, Ser: 0.6 mg/dL — ABNORMAL LOW (ref 0.61–1.24)
GFR calc non Af Amer: >60 mL/min (ref >60)
Glucose, Bld: 108 mg/dL — ABNORMAL HIGH (ref 65–99)
POTASSIUM: 3.9 mmol/L (ref 3.5–5.1)
Sodium: 139 mmol/L (ref 135–145)

## 2016-07-27 LAB — GLUCOSE, CAPILLARY
GLUCOSE-CAPILLARY: 86 mg/dL (ref 65–99)
Glucose-Capillary: 101 mg/dL — ABNORMAL HIGH (ref 65–99)
Glucose-Capillary: 107 mg/dL — ABNORMAL HIGH (ref 65–99)
Glucose-Capillary: 110 mg/dL — ABNORMAL HIGH (ref 65–99)
Glucose-Capillary: 82 mg/dL (ref 65–99)
Glucose-Capillary: 99 mg/dL (ref 65–99)

## 2016-07-27 LAB — CBC
HEMATOCRIT: 24 % — AB (ref 39.0–52.0)
HEMOGLOBIN: 7.7 g/dL — AB (ref 13.0–17.0)
MCH: 29.3 pg (ref 26.0–34.0)
MCHC: 32.1 g/dL (ref 30.0–36.0)
MCV: 91.3 fL (ref 78.0–100.0)
Platelets: 312 10*3/uL (ref 150–400)
RBC: 2.63 MIL/uL — ABNORMAL LOW (ref 4.22–5.81)
RDW: 18.7 % — AB (ref 11.5–15.5)
WBC: 8.9 10*3/uL (ref 4.0–10.5)

## 2016-07-27 LAB — URINALYSIS, ROUTINE W REFLEX MICROSCOPIC
Bilirubin Urine: NEGATIVE
GLUCOSE, UA: NEGATIVE mg/dL
HGB URINE DIPSTICK: NEGATIVE
KETONES UR: NEGATIVE mg/dL
Nitrite: NEGATIVE
PH: 6 (ref 5.0–8.0)
Protein, ur: NEGATIVE mg/dL
Specific Gravity, Urine: 1.016 (ref 1.005–1.030)

## 2016-07-27 LAB — URINE MICROSCOPIC-ADD ON

## 2016-07-27 MED ORDER — DEXTROSE 5 % IV SOLN
30.0000 ug/min | INTRAVENOUS | Status: DC
  Administered 2016-07-27: 10 ug/min via INTRAVENOUS
  Administered 2016-07-27: 30 ug/min via INTRAVENOUS
  Administered 2016-07-27: 75 ug/min via INTRAVENOUS
  Administered 2016-07-28: 125 ug/min via INTRAVENOUS
  Filled 2016-07-27 (×4): qty 1

## 2016-07-27 MED ORDER — FREE WATER: 250.0000 mL | Freq: Four times a day (QID) | Status: DC

## 2016-07-27 MED ORDER — SODIUM CHLORIDE 0.9 % IV SOLN
3.0000 g | Freq: Three times a day (TID) | INTRAVENOUS | Status: DC
  Administered 2016-07-27 – 2016-07-31 (×13): 3 g via INTRAVENOUS
  Filled 2016-07-27 (×14): qty 3

## 2016-07-27 MED ORDER — SODIUM CHLORIDE 0.9 % IV BOLUS (SEPSIS)
500.0000 mL | Freq: Once | INTRAVENOUS | Status: AC
  Administered 2016-07-27: 500 mL via INTRAVENOUS

## 2016-07-27 MED ORDER — ACETAMINOPHEN 160 MG/5ML PO SOLN
650.0000 mg | Freq: Four times a day (QID) | ORAL | Status: DC | PRN
  Administered 2016-07-27: 650 mg
  Filled 2016-07-27: qty 20.3

## 2016-07-27 NOTE — Consult Note (Signed)
Chief Complaint: VDRF  Referring Physician:Dr. Jennet Maduro  Supervising Physician: Marybelle Killings  Patient Status: In-pt   HPI: Dale [name] is an 50 y.o. 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  He require intubated and ultimately tracheostomy and was sent to a facility where he was able to be decannulated.  He has encephalopathy from this disease process.  He has had to be admitted two other times since that admission with respiratory failure, including this time.  He was unresponsive this admission and has been intubated and now trached again.  His Cortrack was pulled out at some point yesterday and he developed aspiration PNA from this. He is running fevers of 101 today with hypotension.  He is on some Neo currently.  We have been asked to evaluate the patient for possible gastrostomy tube.  Past Medical History:  Past Medical History:  Diagnosis Date  . Acute encephalopathy   . Acute ischemic stroke ([place])   . Acute renal failure ([place])   . Acute respiratory failure with hypoxia ([place])   . AKI (acute kidney injury) ([place]) 02/16/2016  . Altered mental status   . Cardiac arrest ([place])   . Cardiogenic shock ([place])   . Cerebral septic emboli ([place])   . Cerebral thrombosis with cerebral infarction 03/25/2016  . CHF (congestive heart failure) ([place])   . Convulsions/seizures ([place]) 05/04/2016   Seizure disorder - Continue Keppra, Depakote.     . Dementia 04/27/2016  . Depression   . Dysphagia 05/04/2016   Dysphagia - Dysphagia 1 diet per SLP     . ETOH abuse   . Hydrocephalus   . Hyperlipidemia 05/04/2016   Dyslipidemia - Continue statin.     Marland Kitchen Hypernatremia   . Hypothermia 03/20/2016  . Low serum cortisol level (HCC) 05/04/2016   Low a.m. cortisol  - Was 1.2 checked on 04/03/2016  - This likely is reflective of cortisol suppression from steroid use - Tapered steroids off   . Meningitis, pneumococcal, recurrent   . Meningitis, streptococcal 02/16/2016  .  Meningoencephalitis   . NSTEMI (non-ST elevated myocardial infarction) ([place]) 03/14/2016  . Protein-calorie malnutrition, severe ([place])   . Respiratory failure ([place]) 03/03/2016  . Septic shock ([place])   . Severe sepsis ([place])   . 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 Substance abuse   . Systolic and diastolic CHF, chronic ([place])   . Tobacco abuse 02/16/2016  . 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 Urinary retention 05/04/2016   Urinary retention - Continue Flomax       Past Surgical History:  Past Surgical History:  Procedure Laterality Date  . ANKLE SURGERY    . CARDIAC CATHETERIZATION N/A 02/17/2016   Procedure: IABP Insertion;  Surgeon: Wellington Hampshire, MD;  Location: [facility];  Service: Cardiovascular;  Laterality: N/A;  . TEE WITHOUT CARDIOVERSION N/A 03/29/2016   Procedure: TRANSESOPHAGEAL ECHOCARDIOGRAM (TEE);  Surgeon: Larey Dresser, MD;  Location: [facility] ENDOSCOPY;  Service: Cardiovascular;  Laterality: N/A;    Family History: History reviewed. No pertinent family history.  Social History:  reports that he has been smoking Cigarettes.  He has been smoking about 0.10 packs per day. He has never used smokeless tobacco. He reports that he uses drugs, including Marijuana. He reports that he does not drink alcohol.  Allergies: No Known Allergies  Medications: Medications reviewed in Epic  ROS: unable to obtain as he is confused and on vent  Mallampati Score: MD Evaluation Airway: Other (  comments) Airway comments: on vent via trach Heart: WNL Abdomen: WNL Chest/ Lungs: WNL ASA  Classification: 3  Physical Exam: BP (!) 76/55 (BP Location: Left Arm)   Pulse (!) 107   Temp 97.4 F (36.3 C) (Axillary)   Resp (!) 22   Ht 5' 8" (1.727 m)   Wt 124 lb 5.4 oz (56.4 kg)   SpO2 100%   BMI 18.91 kg/m  Body mass index is 18.91 kg/m. General: chronically ill appearing black male who is restless in bed  HEENT: head is normocephalic, atraumatic.  Sclera are  noninjected.  Ears and nose without any masses or lesions.  Mouth is pink.  PANDA in nose.  Trach in place in neck Heart: regular rhythm, but tachy.  Normal s1,s2. No obvious murmurs, gallops, or rubs noted.  Palpable radial and pedal pulses bilaterally Lungs: scattered rhonchi.  Respiratory effort nonlabored on vent Abd: soft, ND, +BS, no masses, hernias, or organomegaly Psych: unable to assess due to sedation and vent support   Labs: Results for orders placed or performed during the hospital encounter of 07/12/16 (from the past 48 hour(s))  Glucose, capillary     Status: None   Collection Time: 07/25/16  4:02 PM  Result Value Ref Range   Glucose-Capillary 71 65 - 99 mg/dL  Glucose, capillary     Status: None   Collection Time: 07/25/16  8:04 PM  Result Value Ref Range   Glucose-Capillary 87 65 - 99 mg/dL  Glucose, capillary     Status: None   Collection Time: 07/25/16 11:56 PM  Result Value Ref Range   Glucose-Capillary 92 65 - 99 mg/dL  Glucose, capillary     Status: None   Collection Time: 07/26/16  3:11 AM  Result Value Ref Range   Glucose-Capillary 71 65 - 99 mg/dL  CBC     Status: Abnormal   Collection Time: 07/26/16  5:36 AM  Result Value Ref Range   WBC 10.7 (H) 4.0 - 10.5 K/uL   RBC 2.57 (L) 4.22 - 5.81 MIL/uL   Hemoglobin 7.6 (L) 13.0 - 17.0 g/dL   HCT 23.9 (L) 39.0 - 52.0 %   MCV 93.0 78.0 - 100.0 fL   MCH 29.6 26.0 - 34.0 pg   MCHC 31.8 30.0 - 36.0 g/dL   RDW 19.2 (H) 11.5 - 15.5 %   Platelets 305 150 - 400 K/uL  Basic metabolic panel     Status: Abnormal   Collection Time: 07/26/16  5:36 AM  Result Value Ref Range   Sodium 142 135 - 145 mmol/L   Potassium 4.0 3.5 - 5.1 mmol/L    Comment: DELTA CHECK NOTED   Chloride 110 101 - 111 mmol/L   CO2 25 22 - 32 mmol/L   Glucose, Bld 87 65 - 99 mg/dL   BUN 14 6 - 20 mg/dL   Creatinine, Ser 0.64 0.61 - 1.24 mg/dL   Calcium 8.2 (L) 8.9 - 10.3 mg/dL   GFR calc non Af Amer >60 >60 mL/min   GFR calc Af Amer >60 >60  mL/min    Comment: (NOTE) The eGFR has been calculated using the CKD EPI equation. This calculation has not been validated in all clinical situations. eGFR's persistently <60 mL/min signify possible Chronic Kidney Disease.    Anion gap 7 5 - 15  Magnesium     Status: None   Collection Time: 07/26/16  5:36 AM  Result Value Ref Range   Magnesium 2.4 1.7 - 2.4 mg/dL  Phosphorus     Status: None   Collection Time: 07/26/16  5:36 AM  Result Value Ref Range   Phosphorus 4.0 2.5 - 4.6 mg/dL  Glucose, capillary     Status: None   Collection Time: 07/26/16  7:30 AM  Result Value Ref Range   Glucose-Capillary 81 65 - 99 mg/dL   Comment 1 Capillary Specimen   Glucose, capillary     Status: Abnormal   Collection Time: 07/26/16  3:12 PM  Result Value Ref Range   Glucose-Capillary 107 (H) 65 - 99 mg/dL   Comment 1 Capillary Specimen   Glucose, capillary     Status: None   Collection Time: 07/26/16  7:55 PM  Result Value Ref Range   Glucose-Capillary 79 65 - 99 mg/dL   Comment 1 Notify RN   Glucose, capillary     Status: Abnormal   Collection Time: 07/27/16 12:04 AM  Result Value Ref Range   Glucose-Capillary 110 (H) 65 - 99 mg/dL  Glucose, capillary     Status: Abnormal   Collection Time: 07/27/16  4:04 AM  Result Value Ref Range   Glucose-Capillary 107 (H) 65 - 99 mg/dL   Comment 1 Venous Specimen   Basic metabolic panel     Status: Abnormal   Collection Time: 07/27/16  5:32 AM  Result Value Ref Range   Sodium 139 135 - 145 mmol/L   Potassium 3.9 3.5 - 5.1 mmol/L   Chloride 105 101 - 111 mmol/L   CO2 26 22 - 32 mmol/L   Glucose, Bld 108 (H) 65 - 99 mg/dL   BUN 17 6 - 20 mg/dL   Creatinine, Ser 0.60 (L) 0.61 - 1.24 mg/dL   Calcium 8.2 (L) 8.9 - 10.3 mg/dL   GFR calc non Af Amer >60 >60 mL/min   GFR calc Af Amer >60 >60 mL/min    Comment: (NOTE) The eGFR has been calculated using the CKD EPI equation. This calculation has not been validated in all clinical situations. eGFR's  persistently <60 mL/min signify possible Chronic Kidney Disease.    Anion gap 8 5 - 15  CBC     Status: Abnormal   Collection Time: 07/27/16  5:32 AM  Result Value Ref Range   WBC 8.9 4.0 - 10.5 K/uL   RBC 2.63 (L) 4.22 - 5.81 MIL/uL   Hemoglobin 7.7 (L) 13.0 - 17.0 g/dL   HCT 24.0 (L) 39.0 - 52.0 %   MCV 91.3 78.0 - 100.0 fL   MCH 29.3 26.0 - 34.0 pg   MCHC 32.1 30.0 - 36.0 g/dL   RDW 18.7 (H) 11.5 - 15.5 %   Platelets 312 150 - 400 K/uL  Urinalysis, Routine w reflex microscopic (not at [facility])     Status: Abnormal   Collection Time: 07/27/16 11:15 AM  Result Value Ref Range   Color, Urine YELLOW YELLOW   APPearance CLOUDY (A) CLEAR   Specific Gravity, Urine 1.016 1.005 - 1.030   pH 6.0 5.0 - 8.0   Glucose, UA NEGATIVE NEGATIVE mg/dL   Hgb urine dipstick NEGATIVE NEGATIVE   Bilirubin Urine NEGATIVE NEGATIVE   Ketones, ur NEGATIVE NEGATIVE mg/dL   Protein, ur NEGATIVE NEGATIVE mg/dL   Nitrite NEGATIVE NEGATIVE   Leukocytes, UA TRACE (A) NEGATIVE  Urine microscopic-add on     Status: Abnormal   Collection Time: 07/27/16 11:15 AM  Result Value Ref Range   Squamous Epithelial / LPF 0-5 (A) NONE SEEN   WBC, UA 0-5 0 -  5 WBC/hpf   RBC / HPF 0-5 0 - 5 RBC/hpf   Bacteria, UA RARE (A) NONE SEEN   Urine-Other YEAST   Glucose, capillary     Status: None   Collection Time: 07/27/16 11:25 AM  Result Value Ref Range   Glucose-Capillary 99 65 - 99 mg/dL    Imaging: Dg Chest Port 1 View  Result Date: 07/27/2016 CLINICAL DATA:  Check tracheostomy tube EXAM: PORTABLE CHEST 1 VIEW COMPARISON:  07/22/2016 FINDINGS: Cardiac shadow is within normal limits. A tracheostomy tube, right jugular central line and feeding catheter are seen in satisfactory position. Patchy bilateral infiltrates are noted in the bases increased from the prior exam. Left pleural effusion is noted as well. IMPRESSION: Patchy basilar infiltrates and left pleural effusion increased from the prior exam. Electronically  Signed   By: Inez Catalina M.D.   On: 07/27/2016 11:31   Dg Abd Portable 1v  Result Date: 07/26/2016 CLINICAL DATA:  Encounter for feeding tube placement. EXAM: PORTABLE ABDOMEN - 1 VIEW COMPARISON:  Earlier today FINDINGS: Advanced feeding tube with tip just before the ligament of Treitz. Nonobstructive bowel gas pattern. Visualized lung bases are clear. IMPRESSION: Advanced feeding tube with tip in the distal duodenum. Electronically Signed   By: Monte Fantasia M.D.   On: 07/26/2016 19:46   Dg Abd Portable 1v  Result Date: 07/26/2016 CLINICAL DATA:  Feeding tube placement. EXAM: PORTABLE ABDOMEN - 1 VIEW COMPARISON:  07/22/2016. FINDINGS: Soft feeding tube has its tip at the gastric antrum. Gas pattern is unremarkable. IMPRESSION: Feeding tube tip at the gastric antrum. Electronically Signed   By: Nelson Chimes M.D.   On: 07/26/2016 10:23    Assessment/Plan 1. VDRF -the patient is just now starting to wean, but he may have to be placed in a vent SNF.  Given the need for feeding access more permanent than a PANDA tube we have been asked to evaluate the patient for a g-tube placement.   -we will plan for g-tube placement, but patient is appearing possibly septic at this time with fevers over 101 and hypotension requiring pressor support.  We can plan to proceed with placement once this improves. -he does have c diff this admission which is being treated still. -he is on unasyn and all cultures from today are pending. -we will follow and plan on placement once he is improved. -Risks and Benefits discussed with the patient's wife including, but not limited to the need for a barium enema during the procedure, bleeding, infection, peritonitis, or damage to adjacent structures. All of the patient's wife's questions were answered, patient's wife is agreeable to proceed. Consent signed and in chart.   Thank you for this interesting consult.  I greatly enjoyed meeting Dale [name] and look forward to  participating in their care.  A copy of this report was sent to the requesting provider on this date.  Electronically Signed: Henreitta Cea 07/27/2016, 1:49 PM   I spent a total of 40 Minutes    in face to face in clinical consultation, greater than 50% of which was counseling/coordinating care for ventilatory dependent respiratory failure, needs g-tube

## 2016-07-27 NOTE — Progress Notes (Signed)
Pharmacy Antibiotic Note  Bonnita HollowCraig D Regner is a 50 y.o. male admitted on 07/12/2016 with AMS and inability to protect airway. Now with likely aspiration pneumonia. Tmax 101.4, WBC within normal limits. Patient has soft blood pressures at 84/54 and is tachycardic in the 110-120s. Pharmacy has been consulted for Unasyn dosing.   Plan: Ampicillin-sulbactam 3 g IV every 8 hours  Monitor renal function and clinical picture Follow-up culture results and length of therapy   Height: 5\' 8"  (172.7 cm) Weight: 124 lb 5.4 oz (56.4 kg) IBW/kg (Calculated) : 68.4  Temp (24hrs), Avg:99 F (37.2 C), Min:97.1 F (36.2 C), Max:101.4 F (38.6 C)   Recent Labs Lab 07/21/16 0427 07/22/16 0430 07/23/16 0500 07/24/16 0450 07/25/16 0420 07/26/16 0536 07/27/16 0532  WBC 9.1 11.6*  --   --  11.0* 10.7* 8.9  CREATININE 0.68 0.73 0.76 0.73 0.61 0.64 0.60*    Estimated Creatinine Clearance: 87.1 mL/min (by C-G formula based on SCr of 0.8 mg/dL).    No Known Allergies  Antimicrobials this admission: Zosyn 8/9>>8/11 Vanc 8/9>>8/10 Primaxin 8/11>> no dose given, switch to Merrem for history of seizures Merrem 8/11>>8/18 PO vanc 8/13>> Unasyn 8/23>>  Microbiology results: 8/8 cdiff +(fromOSH) 8/9 BCx x2: neg Urine 8/8 > enterobacter R to zoysn, S imip (only 20K colonies but want to continue abx due to clinical status and unknown source for sepsis) 8/8 BCx x2: neg 8/8 Sputum: enterobacter 8/8 MRSA PCR: neg 8/12 C diff: antigen and toxin positive 8/23 blood cx: pending 8/23 sputum: pending 8/23 urine cx: pending   Thank you for allowing pharmacy to be a part of this patient's care.  York CeriseKatherine Cook, PharmD Pharmacy Resident  Pager [phone] 07/27/16 11:24 AM

## 2016-07-27 NOTE — Progress Notes (Signed)
PULMONARY / CRITICAL CARE MEDICINE   Name: Dale [name] MRN: [id number] DOB: 11/02/1966    ADMISSION DATE:  07/12/2016 CONSULTATION DATE:  07/12/16  REFERRING MD:  APH  CHIEF COMPLAINT:  AMS  SUBJECTIVE:   Pulled NGT and aspirated.  Failed weaning this AM. Febrile this AM.  VITAL SIGNS: BP 93/72 (BP Location: Right Arm)   Pulse (!) 124   Temp (!) 101.4 F (38.6 C) (Oral)   Resp (!) 24   Ht 5\' 8"  (1.727 m)   Wt 56.4 kg (124 lb 5.4 oz)   SpO2 100%   BMI 18.91 kg/m   HEMODYNAMICS:    VENTILATOR SETTINGS: Vent Mode: CPAP;PSV FiO2 (%):  [40 %] 40 % Set Rate:  [14 bmp] 14 bmp Vt Set:  [550 mL] 550 mL PEEP:  [5 cmH20] 5 cmH20 Pressure Support:  [10 cmH20] 10 cmH20 Plateau Pressure:  [15 cmH20-18 cmH20] 18 cmH20  INTAKE / OUTPUT: I/O last 3 completed shifts: In: 4448.5 [I.V.:360; NG/GT:4088.5] Out: 3475 [Urine:3250; Stool:225]  PHYSICAL EXAMINATION: General: ill appearing, trached on vent, not weaning. Neuro: RASS 0, more calm, follows basic commands HEENT: mm moist, no JVD, trach in place. Cardiac: regular, sinus tach. Chest: resps even non labored on vent, scattered rhonchi. Abd: soft, non tender. Ext: 1+ edema UE, 2+ LLE. Skin: sacral pressure ulcer.  LABS:  BMET  Recent Labs Lab 07/25/16 0420 07/26/16 0536 07/27/16 0532  NA 142 142 139  K 3.2* 4.0 3.9  CL 112* 110 105  CO2 25 25 26   BUN 15 14 17   CREATININE 0.61 0.64 0.60*  GLUCOSE 113* 87 108*   Electrolytes  Recent Labs Lab 07/21/16 0427 07/21/16 1027  07/25/16 0420 07/26/16 0536 07/27/16 0532  CALCIUM 8.2*  --   < > 8.3* 8.2* 8.2*  MG 2.1  --   --  1.8 2.4  --   PHOS  --  3.6  --  4.0 4.0  --   < > = values in this interval not displayed.  CBC  Recent Labs Lab 07/25/16 0420 07/26/16 0536 07/27/16 0532  WBC 11.0* 10.7* 8.9  HGB 7.4* 7.6* 7.7*  HCT 23.8* 23.9* 24.0*  PLT 304 305 312   Coag's  Recent Labs Lab 07/22/16 0430  APTT 34  INR 0.96   Sepsis Markers No results  for input(s): LATICACIDVEN, PROCALCITON, O2SATVEN in the last 168 hours.  ABG No results for input(s): PHART, PCO2ART, PO2ART in the last 168 hours.  Liver Enzymes No results for input(s): AST, ALT, ALKPHOS, BILITOT, ALBUMIN in the last 168 hours.  Cardiac Enzymes No results for input(s): TROPONINI, PROBNP in the last 168 hours.  Glucose  Recent Labs Lab 07/26/16 0311 07/26/16 0730 07/26/16 1512 07/26/16 1955 07/27/16 0004 07/27/16 0404  GLUCAP 71 81 107* 79 110* 107*   Imaging Dg Abd Portable 1v  Result Date: 07/26/2016 CLINICAL DATA:  Encounter for feeding tube placement. EXAM: PORTABLE ABDOMEN - 1 VIEW COMPARISON:  Earlier today FINDINGS: Advanced feeding tube with tip just before the ligament of Treitz. Nonobstructive bowel gas pattern. Visualized lung bases are clear. IMPRESSION: Advanced feeding tube with tip in the distal duodenum. Electronically Signed   By: Marnee SpringJonathon  Watts M.D.   On: 07/26/2016 19:46   Dg Abd Portable 1v  Result Date: 07/26/2016 CLINICAL DATA:  Feeding tube placement. EXAM: PORTABLE ABDOMEN - 1 VIEW COMPARISON:  07/22/2016. FINDINGS: Soft feeding tube has its tip at the gastric antrum. Gas pattern is unremarkable. IMPRESSION: Feeding tube tip at  the gastric antrum. Electronically Signed   By: Paulina FusiMark  Shogry M.D.   On: 07/26/2016 10:23   STUDIES:  CT head 8/8 >> old b/l infarcts with subacute infarct again identified at inferior left caudate.  No acute process. EEG 8/10 >>suppressed backround MRI brain 8/11 >> multifocal acute Lt MCA ischemic infarcts, possible infarct Rt caudate head  CULTURES: 8/08 Urine >> Enterobacter 8/08 Blood >> NEG 8/09 Sputum >> MDR Enterobacter 8/09 Blood >> NEG 8/12 C diff >> antigen and toxin positive 8/23 Blood>>> 8/23 Urine>>> 8/23 Sputum>>>  ANTIBIOTICS: Vanc 8/8 >>8/10 Zosyn 8/8 >>8/11 meropenem 8/11>> 8/18 Oral vancomycin 8/12 >> Unasyn 8/23>>>  SIGNIFICANT EVENTS: 8/08 admitted with unresponsiveness,  required intubation. 8/09 low cvp, levophed, possible seizure  8/10 not moving rt ext upper and lowers 8/13 Following commands 8/15>> Following Commands, but agitated., Norepi off 8/15 am.  LINES/TUBES: ETT 8/8 > 8/18 Trach (JY) 8/18 >>  R IJ CVL 8/8 >  DISCUSSION: 50 yo male from NH with frequent admissions admitted again 8/18 with altered mental status, inability to protect airway.  He was hypotensive and hypothermic from UTI. Acute resp failure and severe delirium. S/p Trach on 8/18.   He has PMHx of combined CHF, Streptococcal meningitis, ETOH, Depression, Seizures, Dementia, CVA.  ASSESSMENT / PLAN:  PULMONARY A: Acute hypoxic respiratory failure - due to inability to protect airway. 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 Aspiration PNA. P:   Hold weaning for now. Will need a vent SNF. Minimize sedation. Tx aspiration PNA and NPO for now.  CARDIOVASCULAR A:  Septic shock from UTI, HCAP, C diff colitis - resolved.  Chronic combined CHF, HLD. P:  MAP goal >65. Restart neo due to likely evolving septic shock. Continue ASA, lipitor. Continue florinef. Hydrocort to chronic adrenal insuff dosing.  RENAL A:   Hypernatremia. Hypokalemia.  P:   F/u BMET. Replete electrolytes as indicated. Free water, decrease to 250 ml q6 hours. NS 500 ml IV bolus.  GASTROINTESTINAL A:   Nutrition. Tolerating TF at goal P:   Hold tube feeds. Pepcid for SUP. IR to place G-tube.  HEMATOLOGIC A:   Anemia of chronic disease and critical illness. No obvious signs of bleeding P:  F/u CBC. SQ heparin for DVT prophylaxis.  INFECTIOUS A:   MDR gram neg UTI HCAP with Enterobacter >> MDR. C diff colitis. New aspiration PNA P:   Completed meropenem on 8/18. Continue enteral vancomycin. Trend fever, WBC, stool output. Pan culture. Unasyn per pharmacy for likely aspiration PNA.  ENDOCRINE A:   Chronic adrenal insufficiency. Hypoglycemia per CBG's, not  BMET P:   Florinef. Stress steroids -wean as able - reduced to q12 8/16. Unclear what his final dose hydrocort should be, he was on 100mg  q8h on admission. Clearly baseline dose should be far less. Goal to ~2.5-5.0mg  bid. Changed to per tube on 8/18. SSI.  NEUROLOGIC A:   Acute encephalopathy. Hx seizures, strep pneumo meningitis, CVA. Agitated requiring increased sedation > now has developed oversedation 8/20 P:   Monitor mental status Continue depakote, keppra  Decreased Seroquel to 100 mg BID 8/20 D/c clonazepam on 8/20 D/C precedex. Daily WUA.  FAMILY Spoke with wife, informed of need for PEG tube and that patient aspirated.  Fully updated 8/23.  The patient is critically ill with multiple organ systems failure and requires high complexity decision making for assessment and support, frequent evaluation and titration of therapies, application of advanced monitoring technologies and extensive interpretation of multiple databases.   Critical  Care Time devoted to patient care services described in this note is  35  Minutes. This time reflects time of care of this signee Dr Jennet Maduro. This critical care time does not reflect procedure time, or teaching time or supervisory time of PA/NP/Med student/Med Resident etc but could involve care discussion time.  Rush Farmer, M.D. [facility] Pulmonary/Critical Care Medicine. Pager: [phone]. After hours pager: [phone].

## 2016-07-28 ENCOUNTER — Inpatient Hospital Stay (HOSPITAL_COMMUNITY): Payer: Medicaid Other

## 2016-07-28 ENCOUNTER — Ambulatory Visit: Payer: Medicaid Other | Admitting: Endocrinology

## 2016-07-28 DIAGNOSIS — A0472 Enterocolitis due to Clostridium difficile, not specified as recurrent: Secondary | ICD-10-CM | POA: Diagnosis not present

## 2016-07-28 DIAGNOSIS — A539 Syphilis, unspecified: Secondary | ICD-10-CM | POA: Diagnosis not present

## 2016-07-28 DIAGNOSIS — J690 Pneumonitis due to inhalation of food and vomit: Secondary | ICD-10-CM | POA: Diagnosis not present

## 2016-07-28 DIAGNOSIS — J9620 Acute and chronic respiratory failure, unspecified whether with hypoxia or hypercapnia: Secondary | ICD-10-CM

## 2016-07-28 DIAGNOSIS — A4902 Methicillin resistant Staphylococcus aureus infection, unspecified site: Secondary | ICD-10-CM

## 2016-07-28 DIAGNOSIS — B9562 Methicillin resistant Staphylococcus aureus infection as the cause of diseases classified elsewhere: Secondary | ICD-10-CM | POA: Diagnosis not present

## 2016-07-28 DIAGNOSIS — R7881 Bacteremia: Secondary | ICD-10-CM | POA: Diagnosis not present

## 2016-07-28 DIAGNOSIS — Z9911 Dependence on respirator [ventilator] status: Secondary | ICD-10-CM

## 2016-07-28 LAB — GLUCOSE, CAPILLARY
GLUCOSE-CAPILLARY: 82 mg/dL (ref 65–99)
GLUCOSE-CAPILLARY: 149 mg/dL — AB (ref 65–99)
GLUCOSE-CAPILLARY: 64 mg/dL — AB (ref 65–99)
GLUCOSE-CAPILLARY: 92 mg/dL (ref 65–99)
GLUCOSE-CAPILLARY: 91 mg/dL (ref 65–99)
Glucose-Capillary: 109 mg/dL — ABNORMAL HIGH (ref 65–99)
Glucose-Capillary: 58 mg/dL — ABNORMAL LOW (ref 65–99)
Glucose-Capillary: 85 mg/dL (ref 65–99)

## 2016-07-28 LAB — CBC
HEMATOCRIT: 23.5 % — AB (ref 39.0–52.0)
HEMOGLOBIN: 7.3 g/dL — AB (ref 13.0–17.0)
MCH: 28.4 pg (ref 26.0–34.0)
MCHC: 31.1 g/dL (ref 30.0–36.0)
MCV: 91.4 fL (ref 78.0–100.0)
Platelets: 298 10*3/uL (ref 150–400)
RBC: 2.57 MIL/uL — AB (ref 4.22–5.81)
RDW: 18.2 % — ABNORMAL HIGH (ref 11.5–15.5)
WBC: 10.8 10*3/uL — ABNORMAL HIGH (ref 4.0–10.5)

## 2016-07-28 LAB — BLOOD GAS, ARTERIAL
ACID-BASE EXCESS: 1.4 mmol/L (ref 0.0–2.0)
BICARBONATE: 25.2 meq/L — AB (ref 20.0–24.0)
Drawn by: 270271
FIO2: 40
LHR: 14 {breaths}/min
O2 Saturation: 99.1 %
PATIENT TEMPERATURE: 98.6
PCO2 ART: 38.1 mmHg (ref 35.0–45.0)
PEEP/CPAP: 5 cmH2O
TCO2: 26.4 mmol/L (ref 0–100)
VT: 550 mL
pH, Arterial: 7.437 (ref 7.350–7.450)
pO2, Arterial: 133 mmHg — ABNORMAL HIGH (ref 80.0–100.0)

## 2016-07-28 LAB — BASIC METABOLIC PANEL
Anion gap: 7 (ref 5–15)
BUN: 10 mg/dL (ref 6–20)
CHLORIDE: 104 mmol/L (ref 101–111)
CO2: 26 mmol/L (ref 22–32)
CREATININE: 0.63 mg/dL (ref 0.61–1.24)
Calcium: 8.3 mg/dL — ABNORMAL LOW (ref 8.9–10.3)
GFR calc Af Amer: >60 mL/min (ref >60)
GFR calc non Af Amer: >60 mL/min (ref >60)
GLUCOSE: 91 mg/dL (ref 65–99)
POTASSIUM: 3.4 mmol/L — AB (ref 3.5–5.1)
Sodium: 137 mmol/L (ref 135–145)

## 2016-07-28 LAB — BLOOD CULTURE ID PANEL (REFLEXED)
Acinetobacter baumannii: NOT DETECTED
CANDIDA ALBICANS: NOT DETECTED
CANDIDA GLABRATA: NOT DETECTED
CANDIDA PARAPSILOSIS: NOT DETECTED
CANDIDA TROPICALIS: NOT DETECTED
Candida krusei: NOT DETECTED
ESCHERICHIA COLI: NOT DETECTED
Enterobacter cloacae complex: NOT DETECTED
Enterobacteriaceae species: NOT DETECTED
Enterococcus species: NOT DETECTED
Haemophilus influenzae: NOT DETECTED
KLEBSIELLA OXYTOCA: NOT DETECTED
Klebsiella pneumoniae: NOT DETECTED
Listeria monocytogenes: NOT DETECTED
Methicillin resistance: DETECTED — AB
Neisseria meningitidis: NOT DETECTED
PROTEUS SPECIES: NOT DETECTED
Pseudomonas aeruginosa: NOT DETECTED
SERRATIA MARCESCENS: NOT DETECTED
STREPTOCOCCUS PNEUMONIAE: NOT DETECTED
STREPTOCOCCUS PYOGENES: NOT DETECTED
Staphylococcus aureus (BCID): DETECTED — AB
Staphylococcus species: DETECTED — AB
Streptococcus agalactiae: NOT DETECTED
Streptococcus species: NOT DETECTED

## 2016-07-28 LAB — MAGNESIUM: MAGNESIUM: 1.7 mg/dL (ref 1.7–2.4)

## 2016-07-28 LAB — PHOSPHORUS: Phosphorus: 4.4 mg/dL (ref 2.5–4.6)

## 2016-07-28 MED ORDER — POTASSIUM CHLORIDE 10 MEQ/50ML IV SOLN
10.0000 meq | INTRAVENOUS | Status: AC
  Administered 2016-07-28 (×4): 10 meq via INTRAVENOUS
  Filled 2016-07-28 (×4): qty 50

## 2016-07-28 MED ORDER — PHENYLEPHRINE HCL 10 MG/ML IJ SOLN
30.0000 ug/min | INTRAVENOUS | Status: DC
  Administered 2016-07-28: 100 ug/min via INTRAVENOUS
  Administered 2016-07-29 (×2): 40 ug/min via INTRAVENOUS
  Filled 2016-07-28 (×3): qty 4

## 2016-07-28 MED ORDER — HEPARIN SODIUM (PORCINE) 5000 UNIT/ML IJ SOLN: 5000.0000 [IU] | Freq: Three times a day (TID) | INTRAMUSCULAR | Status: DC

## 2016-07-28 MED ORDER — DEXTROSE 50 % IV SOLN
INTRAVENOUS | Status: AC
  Filled 2016-07-28: qty 50

## 2016-07-28 MED ORDER — DEXTROSE 50 % IV SOLN
25.0000 g | Freq: Once | INTRAVENOUS | Status: AC
  Administered 2016-07-28: 25 g via INTRAVENOUS

## 2016-07-28 MED ORDER — CEFAZOLIN SODIUM-DEXTROSE 2-4 GM/100ML-% IV SOLN
2.0000 g | INTRAVENOUS | Status: AC
  Administered 2016-08-01: 2 g via INTRAVENOUS
  Filled 2016-07-28 (×2): qty 100

## 2016-07-28 MED ORDER — VANCOMYCIN HCL 500 MG IV SOLR
500.0000 mg | Freq: Three times a day (TID) | INTRAVENOUS | Status: DC
  Administered 2016-07-28 – 2016-07-29 (×4): 500 mg via INTRAVENOUS
  Filled 2016-07-28 (×6): qty 500

## 2016-07-28 MED ORDER — HYDROCORTISONE NA SUCCINATE PF 100 MG IJ SOLR
50.0000 mg | Freq: Three times a day (TID) | INTRAMUSCULAR | Status: DC
  Administered 2016-07-28 – 2016-07-29 (×3): 50 mg via INTRAVENOUS
  Filled 2016-07-28 (×3): qty 2

## 2016-07-28 MED ORDER — VANCOMYCIN HCL 500 MG IV SOLR
500.0000 mg | Freq: Three times a day (TID) | INTRAVENOUS | Status: DC
  Filled 2016-07-28 (×2): qty 500

## 2016-07-28 NOTE — Progress Notes (Signed)
PHARMACY - PHYSICIAN COMMUNICATION CRITICAL VALUE ALERT - BLOOD CULTURE IDENTIFICATION (BCID)  Results for orders placed or performed during the hospital encounter of 07/12/16  Blood Culture ID Panel (Reflexed) (Collected: 07/27/2016 12:23 PM)  Result Value Ref Range   Enterococcus species NOT DETECTED NOT DETECTED   Listeria monocytogenes NOT DETECTED NOT DETECTED   Staphylococcus species DETECTED (A) NOT DETECTED   Staphylococcus aureus DETECTED (A) NOT DETECTED   Methicillin resistance DETECTED (A) NOT DETECTED   Streptococcus species NOT DETECTED NOT DETECTED   Streptococcus agalactiae NOT DETECTED NOT DETECTED   Streptococcus pneumoniae NOT DETECTED NOT DETECTED   Streptococcus pyogenes NOT DETECTED NOT DETECTED   Acinetobacter baumannii NOT DETECTED NOT DETECTED   Enterobacteriaceae species NOT DETECTED NOT DETECTED   Enterobacter cloacae complex NOT DETECTED NOT DETECTED   Escherichia coli NOT DETECTED NOT DETECTED   Klebsiella oxytoca NOT DETECTED NOT DETECTED   Klebsiella pneumoniae NOT DETECTED NOT DETECTED   Proteus species NOT DETECTED NOT DETECTED   Serratia marcescens NOT DETECTED NOT DETECTED   Haemophilus influenzae NOT DETECTED NOT DETECTED   Neisseria meningitidis NOT DETECTED NOT DETECTED   Pseudomonas aeruginosa NOT DETECTED NOT DETECTED   Candida albicans NOT DETECTED NOT DETECTED   Candida glabrata NOT DETECTED NOT DETECTED   Candida krusei NOT DETECTED NOT DETECTED   Candida parapsilosis NOT DETECTED NOT DETECTED   Candida tropicalis NOT DETECTED NOT DETECTED    Name of physician (or Provider) Contacted: Cyril Mourningakesh Alva  Changes to prescribed antibiotics required: Add vancomycin IV for MRSA bacteremia. Per MD, continue Unasyn for now.  Fredonia HighlandMichael Bitonti, PharmD PGY-1 Pharmacy Resident Pager: [phone]

## 2016-07-28 NOTE — Progress Notes (Signed)
eLink Physician-Brief Progress Note Patient Name: Dale [name] DOB: 04/20/1966 MRN: [id number]   Date of Service  07/28/2016  HPI/Events of Note  Hypothermia - Temp = 94.0 F.  eICU Interventions  Will order [employer].      Intervention Category Intermediate Interventions: Other:  Lenell AntuSommer,Jonathen Rathman [place] 07/28/2016, 7:41 PM

## 2016-07-28 NOTE — Progress Notes (Signed)
eLink Physician-Brief Progress Note Patient Name: Bonnita HollowCraig D Dippolito DOB: 08/26/1966 MRN: [id number]   Date of Service  07/28/2016  HPI/Events of Note  Low K  eICU Interventions  replaced     Intervention Category Intermediate Interventions: Electrolyte abnormality - evaluation and management  Cambry Spampinato 07/28/2016, 4:51 AM

## 2016-07-28 NOTE — Clinical Social Work Note (Signed)
Clinical Social Work Assessment  Patient Details  Name: Dale HollowCraig D Magaw MRN: [id number] Date of Birth: 12/23/1965  Date of referral:  07/28/16               Reason for consult:  Facility Placement                Permission sought to share information with:  Family Supports Permission granted to share information::  Yes, Verbal Permission Granted  Name::     Mickeal SkinnerShirley Leggio  Relationship::  wife  Contact Information:  [phone]  Housing/Transportation Living arrangements for the past 2 months:  Skilled Nursing Facility Source of Information:  Spouse Patient Interpreter Needed:  None Criminal Activity/Legal Involvement Pertinent to Current Situation/Hospitalization:  No - Comment as needed Significant Relationships:  Spouse Lives with:  Facility Resident Do you feel safe going back to the place where you live?  No (Pt needs a higher level of care due to vent) Need for family participation in patient care:  Yes (Comment)  Care giving concerns:  Pt is in need of a higher level of care.   Social Worker assessment / plan:  CSW spoke with pt's wife to address consult. CSW introduced herself and explained role of social work. CSW also explained the process of discharging pt to a vent SNF. Pt is in need a higher level SNF due to his vent requirement. CSW initiated a Vent SNF search. CSW is awaiting bed offers. [facility] [place] due to agitation and frequent admissions, however is status changes they will be able to revisit the referral. CSW has [facility] out to [facility], [facility], and [facility]. Pt's wife expressed a preference for [facility], so that she can visit. If pt were to go further she would not be able to visit. CSW explained that the options are limited due to vent requirements. CSW will continue to follow.   Employment status:  Disabled (Comment on whether or not currently receiving Disability) Insurance information:  [place]icaid In [place] Recommendations:  Not assessed at  this time Information / Referral to community resources:  Skilled [facility]ing Facility, Other (Comment Required) (Vent SNF)  Patient/Family's Response to care:  Pt's wife was appreciative of CSW support.   Patient/Family's Understanding of and Emotional Response to Diagnosis, Current Treatment, and Prognosis:  Pt wife understands that pt needs a Vent SNF, however pt's wife prefers somewhere more local.   Emotional Assessment Appearance:  Other (Comment Required Attitude/Demeanor/Rapport:  Other Affect (typically observed):  Other Orientation:  Oriented to Self, Fluctuating Orientation (Suspected and/or reported Sundowners) Alcohol / Substance use:  Never Used Psych involvement (Current and /or in the community):  No (Comment)  Discharge Needs  Concerns to be addressed:  Adjustment to Illness, Other (Comment Required (Vent SNF search) Readmission within the last 30 days:    Current discharge risk:  Chronically ill Barriers to Discharge:  Vent Bed not available   Dede QuerySarah Avyonna Wagoner, LCSW 07/28/2016, 4:54 PM

## 2016-07-28 NOTE — Consult Note (Signed)
[name]    Date of Admission:  07/12/2016            Day 13 oral vancomycin        Day 2 IV vancomycin        Day 2 ampicillin sulbactam       Reason for Consult: Automatic consultation for MRSA bacteremia     Active Problems:   MRSA bacteremia   C. difficile colitis   Aspiration pneumonia (Dale [name])   History of bacterial meningitis   Homeless   Tobacco abuse   ETOH abuse   Substance abuse   Cardiogenic shock (Dale [name])   Cardiac arrest (Dale [name])   Respiratory failure (Dale [name])   Tracheostomy status (Dale [name])   NSTEMI (non-ST elevated myocardial infarction) (Dale [name])   Systolic and diastolic CHF, chronic (Dale [name])   Cerebral thrombosis with cerebral infarction   Moderate protein-calorie malnutrition (Dale [name])   Hyperlipidemia   Dysphagia   Seizure (Dale [name])   Adrenal insufficiency (Dale [name]) (Dale [name])   Encephalopathy   Arterial hypotension   New onset a-fib (Dale [name])   Syphilis   . ampicillin-sulbactam (UNASYN) IV  3 g Intravenous Q8H  . antiseptic oral rinse  7 mL Mouth Rinse 10 times per day  . aspirin  325 mg Per Tube Daily  . atorvastatin  20 mg Per Tube q1800  . [START ON 08/01/2016]  ceFAZolin (ANCEF) IV  2 g Intravenous to XRAY  . chlorhexidine gluconate (SAGE KIT)  15 mL Mouth Rinse BID  . famotidine  20 mg Per Tube BID  . fentaNYL (SUBLIMAZE) injection  200 mcg Intravenous Once  . fludrocortisone  0.1 mg Per Tube Daily  . folic acid  1 mg Per Tube Daily  . heparin  5,000 Units Subcutaneous Q8H  . [START ON 08/02/2016] heparin subcutaneous  5,000 Units Subcutaneous Q8H  . hydrocortisone sod succinate (SOLU-CORTEF) inj  50 mg Intravenous Q8H  . insulin aspart  0-20 Units Subcutaneous Q4H  . levETIRAcetam  1,500 mg Per Tube BID  . midazolam  4 mg Intravenous Once  . QUEtiapine  100 mg Per Tube BID  . sodium chloride flush  10-40 mL Intracatheter Q12H  . thiamine  100 mg Per Tube Daily  . Valproate Sodium  750 mg Per Tube Q8H  . vancomycin  500  mg Per Tube Q6H  . vancomycin  500 mg Intravenous Q8H    Recommendations: 1. Continue current antibiotics 2. Repeat blood cultures   Assessment: [name] has MRSA bacteremia. This may be from his central line that was placed on admission and/or he could have pneumonia. I will continue IV vancomycin and repeat blood cultures. He has had multiple echocardiograms recently, the last 3 weeks ago. None of them showed any evidence of endocarditis or valvular abnormalities. I'm not sure if repeat echocardiography now will greatly alter his management or outcome. He had some worsening of his bibasilar infiltrates yesterday after he was noted to have aspirated tube feedings. This may be more of a chemical pneumonitis than bacterial pneumonia. It would be best if we can limit his exposure to other systemic antibiotics which will make it more difficult to control his C. difficile colitis. I would consider a short course of ampicillin sulbactam pending final sputum culture results and further observation.   HPI: Dale [name] is a 49 y.o. male who was admitted to this hospital in March of this year with pneumococcal meningitis. He has had  an extremely complicated course since then. He is currently in his eighth admission and has been hospitalized nearly continuously. He has had problems with persistent/recurrent encephalopathy, seizures, respiratory failure, atrial fibrillation, heart failure, adrenal insufficiency and recurrent infections. He was hospitalized on 06/16/2016 for sepsis and pneumonia. He was treated with vancomycin and piperacillin tazobactam. He was discharged on 07/11/2016 but was readmitted the following day with hypothermia, acute on chronic respiratory failure and worsening mental status. He was treated with vancomycin and meropenem for probable HCAP. He then developed diarrhea was found to have C. difficile colitis. He spiked another fever yesterday morning. It appeared that he was aspirating  his tube feedings. Blood cultures are growing MRSA in one of 2 sets. Sputum Gram stain shows mixed bacterial flora.   Review of Systems: Review of Systems  Unable to perform ROS: Intubated  Constitutional:       He is unresponsive on the ventilator. He has a tracheostomy tube.  Skin: Negative for rash.    Past Medical History:  Diagnosis Date  . Acute encephalopathy   . Acute ischemic stroke (Dale [name])   . Acute renal failure (Dale [name])   . Acute respiratory failure with hypoxia (Dale [name])   . AKI (acute kidney injury) (Dale [name]) 02/16/2016  . Altered mental status   . Cardiac arrest (Dale [name])   . Cardiogenic shock (Dale [name])   . Cerebral septic emboli (Dale [name])   . Cerebral thrombosis with cerebral infarction 03/25/2016  . CHF (congestive heart failure) (Dale [name])   . Convulsions/seizures (Dale [name]) 05/04/2016   Seizure disorder - Continue Keppra, Depakote.     . Dementia 04/27/2016  . Depression   . Dysphagia 05/04/2016   Dysphagia - Dysphagia 1 diet per SLP     . ETOH abuse   . Hydrocephalus   . Hyperlipidemia 05/04/2016   Dyslipidemia - Continue statin.     Marland Kitchen Hypernatremia   . Hypothermia 03/20/2016  . Low serum cortisol level (HCC) 05/04/2016   Low a.m. cortisol  - Was 1.2 checked on 04/03/2016  - This likely is reflective of cortisol suppression from steroid use - Tapered steroids off   . Meningitis, pneumococcal, recurrent   . Meningitis, streptococcal 02/16/2016  . Meningoencephalitis   . NSTEMI (non-ST elevated myocardial infarction) (Dale [name]) 03/14/2016  . Protein-calorie malnutrition, severe (Dale [name])   . Respiratory failure (Dale [name]) 03/03/2016  . Septic shock (Dale [name])   . Severe sepsis (Dale [name])   . 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 Substance abuse   . Systolic and diastolic CHF, chronic (Dale [name])   . Tobacco abuse 02/16/2016  . 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 Urinary retention 05/04/2016   Urinary retention - Continue Flomax       Social History  Substance Use Topics  . Smoking status: Current Every Day  Smoker    Packs/day: 0.10    Types: Cigarettes  . Smokeless tobacco: Never Used  . Alcohol use No    History reviewed. No pertinent family history. No Known Allergies  OBJECTIVE: Blood pressure 114/83, pulse 60, temperature 97.5 F (36.4 C), temperature source Oral, resp. rate 14, height _0  (1.727 m), weight 122 lb 12.7 oz (55.7 kg), SpO2 100 %.  Physical Exam  Constitutional:  He is unresponsive on the ventilator. He has a tracheostomy tube.  HENT:  He has a right IJ central catheter that was placed on 07/12/2016.  Cardiovascular: Normal rate and regular rhythm.   No murmur heard. Pulmonary/Chest: Breath sounds normal.  Abdominal: Soft.  There is liquid brown stool in his  rectal bag.  Genitourinary:  Genitourinary Comments: He has a condom catheter.  Musculoskeletal: Normal range of motion. He exhibits no edema or tenderness.  Skin: No rash noted.  Several tattoos. He has a healing stage II sacral pressure ulcer without evidence of infection.    Lab Results Lab Results  Component Value Date   WBC 10.8 (H) 07/28/2016   HGB 7.3 (L) 07/28/2016   HCT 23.5 (L) 07/28/2016   MCV 91.4 07/28/2016   PLT 298 07/28/2016    Lab Results  Component Value Date   CREATININE 0.63 07/28/2016   BUN 10 07/28/2016   NA 137 07/28/2016   K 3.4 (L) 07/28/2016   CL 104 07/28/2016   CO2 26 07/28/2016    Lab Results  Component Value Date   ALT 10 (L) 07/12/2016   AST 12 (L) 07/12/2016   ALKPHOS 47 07/12/2016   BILITOT 0.3 07/12/2016     Microbiology: Recent Results (from the past 240 hour(s))  Urine culture     Status: None (Preliminary result)   Collection Time: 07/27/16 11:15 AM  Result Value Ref Range Status   Specimen Description URINE, RANDOM  Final   Special Requests NONE  Final   Culture CULTURE REINCUBATED FOR BETTER GROWTH  Final   Report Status PENDING  Incomplete  Culture, respiratory (NON-Expectorated)     Status: None (Preliminary result)   Collection Time:  07/27/16 11:15 AM  Result Value Ref Range Status   Specimen Description TRACHEAL ASPIRATE  Final   Special Requests NONE  Final   Gram Stain   Final    MODERATE WBC PRESENT, PREDOMINANTLY PMN MODERATE GRAM POSITIVE COCCI IN PAIRS IN CLUSTERS FEW GRAM POSITIVE RODS RARE GRAM NEGATIVE RODS    Culture CULTURE REINCUBATED FOR BETTER GROWTH  Final   Report Status PENDING  Incomplete  Culture, blood (Routine X 2) w Reflex to ID Panel     Status: None (Preliminary result)   Collection Time: 07/27/16 12:23 PM  Result Value Ref Range Status   Specimen Description BLOOD RIGHT ARM  Final   Special Requests BOTTLES DRAWN AEROBIC AND ANAEROBIC 5 CC  Final   Culture  Setup Time   Final    GRAM POSITIVE COCCI IN CLUSTERS AEROBIC BOTTLE ONLY Organism ID to follow CRITICAL RESULT CALLED TO, READ BACK BY AND VERIFIED WITH: M. Bitonti Pharm.D. 11:30 07/28/16 ([email or web address])    Culture NO GROWTH 1 DAY  Final   Report Status PENDING  Incomplete  Culture, blood (Routine X 2) w Reflex to ID Panel     Status: None (Preliminary result)   Collection Time: 07/27/16 12:23 PM  Result Value Ref Range Status   Specimen Description BLOOD RIGHT ARM  Final   Special Requests BOTTLES DRAWN AEROBIC AND ANAEROBIC 5 CC  Final   Culture NO GROWTH 1 DAY  Final   Report Status PENDING  Incomplete  Blood Culture ID Panel (Reflexed)     Status: Abnormal   Collection Time: 07/27/16 12:23 PM  Result Value Ref Range Status   Enterococcus species NOT DETECTED NOT DETECTED Final   Listeria monocytogenes NOT DETECTED NOT DETECTED Final   Staphylococcus species DETECTED (A) NOT DETECTED Final    Comment: CRITICAL RESULT CALLED TO, READ BACK BY AND VERIFIED WITH: M. Bitonti Pharm.D. 11:30 07/28/16 ([email or web address])    Staphylococcus aureus DETECTED (A) NOT DETECTED Final    Comment: CRITICAL RESULT CALLED TO, READ BACK BY AND VERIFIED WITH: M. Bitonti Pharm.D. 11:30 07/28/16  ([email or web address])  Methicillin resistance DETECTED (A) NOT DETECTED  Final    Comment: CRITICAL RESULT CALLED TO, READ BACK BY AND VERIFIED WITH: M. Bitonti Pharm.D. 11:30 07/28/16 ([email or web address])    Streptococcus species NOT DETECTED NOT DETECTED Final   Streptococcus agalactiae NOT DETECTED NOT DETECTED Final   Streptococcus pneumoniae NOT DETECTED NOT DETECTED Final   Streptococcus pyogenes NOT DETECTED NOT DETECTED Final   Acinetobacter baumannii NOT DETECTED NOT DETECTED Final   Enterobacteriaceae species NOT DETECTED NOT DETECTED Final   Enterobacter cloacae complex NOT DETECTED NOT DETECTED Final   Escherichia coli NOT DETECTED NOT DETECTED Final   Klebsiella oxytoca NOT DETECTED NOT DETECTED Final   Klebsiella pneumoniae NOT DETECTED NOT DETECTED Final   Proteus species NOT DETECTED NOT DETECTED Final   Serratia marcescens NOT DETECTED NOT DETECTED Final   Haemophilus influenzae NOT DETECTED NOT DETECTED Final   Neisseria meningitidis NOT DETECTED NOT DETECTED Final   Pseudomonas aeruginosa NOT DETECTED NOT DETECTED Final   Candida albicans NOT DETECTED NOT DETECTED Final   Candida glabrata NOT DETECTED NOT DETECTED Final   Candida krusei NOT DETECTED NOT DETECTED Final   Candida parapsilosis NOT DETECTED NOT DETECTED Final   Candida tropicalis NOT DETECTED NOT DETECTED Final   Chest x-ray 07/27/2016  IMPRESSION: Patchy basilar infiltrates and left pleural effusion increased from the prior exam.    By: Inez Catalina M.D.   On: 07/27/2016 11:31   Michel Bickers, [facility] [phone] pager   [phone] cell 07/28/2016, 5:14 PM

## 2016-07-28 NOTE — Progress Notes (Signed)
PULMONARY / CRITICAL CARE MEDICINE   Name: Dale [name] MRN: [id number] DOB: [birth year]    ADMISSION DATE:  07/12/2016 CONSULTATION DATE:  07/12/16  REFERRING MD:  [facility]  CHIEF COMPLAINT:  AMS   50 yo male from NH with frequent admissions admitted again 8/18 with altered mental status, inability to protect airway.  He was hypotensive and hypothermic from UTI. Acute resp failure and severe delirium. S/p Trach on 8/18.   He has PMHx of combined CHF, Streptococcal meningitis, ETOH, Depression, Seizures, Dementia, CVA.  SUBJECTIVE:   Weaning on PS 10/5 aFebrile this AM. Good UO  VITAL SIGNS: BP 115/76 (BP Location: Left Arm)   Pulse 74   Temp 97.7 F (36.5 C) (Oral)   Resp 18   Ht 5\' 8"  (1.727 m)   Wt 55.7 kg (122 lb 12.7 oz)   SpO2 100%   BMI 18.67 kg/m   HEMODYNAMICS:    VENTILATOR SETTINGS: Vent Mode: PRVC FiO2 (%):  [40 %] 40 % Set Rate:  [14 bmp] 14 bmp Vt Set:  [550 mL] 550 mL PEEP:  [5 cmH20] 5 cmH20 Pressure Support:  [10 cmH20] 10 cmH20 Plateau Pressure:  [13 cmH20-19 cmH20] 17 cmH20  INTAKE / OUTPUT: I/O last 3 completed shifts: In: 4587.1 [I.V.:1603.4; NG/GT:2583.7; IV Piggyback:400] Out: 2025 [Urine:1825; Stool:200]  PHYSICAL EXAMINATION: General: ill appearing, trached on vent Neuro: RASS 0, more calm, follows basic commands HEENT: mm moist, no JVD, trach in place. Cardiac: regular, sinus tach. Chest: resps even non labored on vent, scattered rhonchi. Abd: soft, non tender. Ext: 1+ edema UE, 2+ LLE. Skin: sacral pressure ulcer.  LABS:  BMET  Recent Labs Lab 07/26/16 0536 07/27/16 0532 07/28/16 0330  NA 142 139 137  K 4.0 3.9 3.4*  CL 110 105 104  CO2 25 26 26   BUN 14 17 10   CREATININE 0.64 0.60* 0.63  GLUCOSE 87 108* 91   Electrolytes  Recent Labs Lab 07/25/16 0420 07/26/16 0536 07/27/16 0532 07/28/16 0330  CALCIUM 8.3* 8.2* 8.2* 8.3*  MG 1.8 2.4  --  1.7  PHOS 4.0 4.0  --  4.4    CBC  Recent Labs Lab 07/26/16 0536  07/27/16 0532 07/28/16 0330  WBC 10.7* 8.9 10.8*  HGB 7.6* 7.7* 7.3*  HCT 23.9* 24.0* 23.5*  PLT 305 312 298   Coag's  Recent Labs Lab 07/22/16 0430  APTT 34  INR 0.96   Sepsis Markers No results for input(s): LATICACIDVEN, PROCALCITON, O2SATVEN in the last 168 hours.  ABG  Recent Labs Lab 07/28/16 0455  PHART 7.437  PCO2ART 38.1  PO2ART 133*    Liver Enzymes No results for input(s): AST, ALT, ALKPHOS, BILITOT, ALBUMIN in the last 168 hours.  Cardiac Enzymes No results for input(s): TROPONINI, PROBNP in the last 168 hours.  Glucose  Recent Labs Lab 07/27/16 1125 07/27/16 1619 07/27/16 1934 07/28/16 0026 07/28/16 0330 07/28/16 0747  GLUCAP 99 82 86 109* 82 58*   Imaging Dg Chest Port 1 View  Result Date: 07/28/2016 CLINICAL DATA:  Dyspnea. EXAM: PORTABLE CHEST 1 VIEW COMPARISON:  07/27/2016 FINDINGS: Right IJ approach central venous catheter, feeding catheter and tracheostomy tube in stable position. Cardiomediastinal silhouette is normal. Mediastinal contours appear intact. There is no evidence of pneumothorax. There is persistent right middle lobe and left lower lobe airspace consolidation with volume loss in the left lung base. Small bilateral pleural effusions cannot be excluded. Osseous structures are without acute abnormality. Soft tissues are grossly normal. IMPRESSION: Persistent bilateral patchy  airspace consolidation with evidence of volume loss and the left lower lobe. Small bilateral pleural effusions cannot be excluded. Stable support apparatus. Electronically Signed   By: Ted Mcalpineobrinka  Dimitrova M.D.   On: 07/28/2016 07:15   Dg Chest Port 1 View  Result Date: 07/27/2016 CLINICAL DATA:  Check tracheostomy tube EXAM: PORTABLE CHEST 1 VIEW COMPARISON:  07/22/2016 FINDINGS: Cardiac shadow is within normal limits. A tracheostomy tube, right jugular central line and feeding catheter are seen in satisfactory position. Patchy bilateral infiltrates are noted in  the bases increased from the prior exam. Left pleural effusion is noted as well. IMPRESSION: Patchy basilar infiltrates and left pleural effusion increased from the prior exam. Electronically Signed   By: Alcide CleverMark  Lukens M.D.   On: 07/27/2016 11:31   STUDIES:  CT head 8/8 >> old b/l infarcts with subacute infarct again identified at inferior left caudate.  No acute process. EEG 8/10 >>suppressed backround MRI brain 8/11 >> multifocal acute Lt MCA ischemic infarcts, possible infarct Rt caudate head  CULTURES: 8/08 Urine >> Enterobacter 8/08 Blood >> NEG 8/09 Sputum >> MDR Enterobacter 8/09 Blood >> NEG 8/12 C diff >> antigen and toxin positive 8/23 Blood>>> GPC clusters >> 8/23 Urine>>> 8/23 Sputum>>> GNR >>  ANTIBIOTICS: Vanc 8/8 >>8/10 Zosyn 8/8 >>8/11 meropenem 8/11>> 8/18 Oral vancomycin 8/12 >> Unasyn 8/23>>>  SIGNIFICANT EVENTS: 8/08 admitted with unresponsiveness, required intubation. 8/09 low cvp, levophed, possible seizure  8/10 not moving rt ext upper and lowers 8/13 Following commands 8/15>> Following Commands, but agitated., Norepi off 8/15 am.  LINES/TUBES: ETT 8/8 > 8/18 Trach (JY) 8/18 >>  R IJ CVL 8/8 >  DISCUSSION:   ASSESSMENT / PLAN:  PULMONARY A: Acute hypoxic respiratory failure - due to inability to protect airway. 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 Aspiration PNA. P:   Resume wean with goal ATC Will need a vent SNF.    CARDIOVASCULAR A:  Septic shock from UTI, HCAP, C diff colitis - resolved.  Chronic combined CHF, HLD. P:  MAP goal >65. ct neo gtt Continue ASA, lipitor. Continue florinef. Hydrocort to chronic adrenal insuff dosing.  RENAL A:   Hypernatremia. Hypokalemia.  P:   F/u BMET. Replete electrolytes as indicated. Free water, decrease to 250 ml q6 hours.   GASTROINTESTINAL A:   Protein calorie malnutrition.  P:   Hold tube feeds. Pepcid for SUP. IR to place G-tube once off  pressors  HEMATOLOGIC A:   Anemia of chronic disease and critical illness. No obvious signs of bleeding P:  F/u CBC. SQ heparin for DVT prophylaxis.  INFECTIOUS A:   MDR gram neg UTI HCAP with Enterobacter >> MDR. C diff colitis. New aspiration PNA GPC clusters in blood P:   Completed meropenem on 8/18. Continue enteral vancomycin. Pan culture. Unasyn per pharmacy for likely aspiration PNA, add IV vanc   ENDOCRINE A:   Chronic adrenal insufficiency. Hypoglycemia per CBG's, not BMET P:   Florinef. Stress steroids -wean as able - reduced to q12 8/16. Unclear what his final dose hydrocort should be, he was on 100mg  q8h on admission. Clearly baseline dose should be far less. Goal to ~2.5-5.0mg  bid. Changed to per tube on 8/18. SSI.  NEUROLOGIC A:   Acute encephalopathy. Hx seizures, strep pneumo meningitis, CVA. Agitated requiring increased sedation > now has developed oversedation 8/20 P:   Continue depakote, keppra  Decreased Seroquel to 100 mg BID 8/20 D/c clonazepam on 8/20 D/C precedex. Daily WUA.  FAMILY Spoke with wife, informed  of need for PEG tube and that patient aspirated.  Fully updated 8/23.  The patient is critically ill with multiple organ systems failure and requires high complexity decision making for assessment and support, frequent evaluation and titration of therapies, application of advanced monitoring technologies and extensive interpretation of multiple databases. Critical Care Time devoted to patient care services described in this note independent of APP time is 35 minutes.    Cyril Mourning MD. Tonny Bollman. [facility] Pager [phone] If no response call [phone]   07/28/2016

## 2016-07-28 NOTE — Progress Notes (Signed)
Pharmacy Antibiotic Note  Dale [name] is a 50 y.o. male admitted on 07/12/2016 with AMS and inability to protect airway. Now with likely aspiration pneumonia. Unasyn started 8/23 and now pharmacy consulted to add vancomycin. WBC 10.8, AF today.  Plan: Ampicillin-sulbactam 3 g IV every 8 hours  Vancomycin 500 mg every 8 hours Monitor renal function and clinical picture Follow-up culture results and length of therapy  Vanc trough when indicated  Height: 5\' 8"  (172.7 cm) Weight: 122 lb 12.7 oz (55.7 kg) IBW/kg (Calculated) : 68.4  Temp (24hrs), Avg:98.4 F (36.9 C), Min:97.3 F (36.3 C), Max:99.5 F (37.5 C)   Recent Labs Lab 07/22/16 0430  07/24/16 0450 07/25/16 0420 07/26/16 0536 07/27/16 0532 07/28/16 0330  WBC 11.6*  --   --  11.0* 10.7* 8.9 10.8*  CREATININE 0.73  < > 0.73 0.61 0.64 0.60* 0.63  < > = values in this interval not displayed.  Estimated Creatinine Clearance: 86.1 mL/min (by C-G formula based on SCr of 0.8 mg/dL).    No Known Allergies  Antimicrobials this admission: Zosyn 8/9>>8/11 Vanc 8/9>>8/10>>8/23>> Primaxin 8/11>> no dose given, switch to Merrem for history of seizures Merrem 8/11>>8/18 PO vanc 8/13>> Unasyn 8/23>>  Microbiology results: 8/8 cdiff +(fromOSH) 8/9 BCx x2: neg Urine 8/8 > enterobacter R to zoysn, S imip (20K) 8/8 BCx x2: neg 8/8 Sputum: enterobacter 8/8 MRSA PCR: neg 8/12 C diff: antigen and toxin positive 8/23 blood cx: GPC clusters >  8/23 sputum: GNR >  8/23 urine cx: pending   Thank you for allowing pharmacy to be a part of this patient's care.  Sherron MondayAubrey N. Muhlestein, PharmD Clinical Pharmacy Resident Pager: [phone] 07/28/16 11:39 AM

## 2016-07-28 NOTE — Progress Notes (Signed)
Nutrition Follow-up  DOCUMENTATION CODES:   Severe malnutrition in context of chronic illness  INTERVENTION:    Resume TF after PEG placed with Vital AF 1.2 at 60 ml/h to provide 1440 ml, 1728 kcal, 108 gm protein, 1167 ml free water daily.  NUTRITION DIAGNOSIS:   Malnutrition (Severe) related to chronic illness as evidenced by severe depletion of body fat, severe depletion of muscle mass.  Ongoing  GOAL:   Patient will meet greater than or equal to 90% of their needs  Unmet  MONITOR:   TF tolerance, Skin, I & O's, Vent status, Labs, Weight trends  ASSESSMENT:   50 y.o. M with complex medical history and multiple recent admissions (strep pneumo meningitis, cardiac arrest, cardiogenic shock, ARDS, CVA, seizures) resides in nursing home, admitted again 8/8 with unresponsiveness and inability to protect airway, intubated in ED.  Hypotensive and hypothermic but has this chronically due to adrenal insufficiency.   Patient has pulled Cortrak feeding tube twice over the past few days, so TF has been on and off. It is suspected that he aspirated and now has aspiration PNA with fever. TF is currently off. Radiology plans to place PEG when sepsis improves.  Labs reviewed: potassium slightly low at 3.4. Medications reviewed and include Novolog, folic acid, Solu-medrol, thiamine.  Patient with trach and on ventilator support. MV: 9.3 L/min Temp (24hrs), Avg:98.4 F (36.9 C), Min:97.3 F (36.3 C), Max:99.5 F (37.5 C)   Diet Order:  Diet NPO time specified  Skin:  Wound (see comment) (stage II scrotum and sacrum, MASD perineum)  Last BM:  8/24 (flexiseal)  Height:   Ht Readings from Last 1 Encounters:  07/22/16 5\' 8"  (1.727 m)    Weight:   Wt Readings from Last 1 Encounters:  07/28/16 122 lb 12.7 oz (55.7 kg)    Ideal Body Weight:  70 kg  BMI:  Body mass index is 18.67 kg/m.  Estimated Nutritional Needs:   Kcal:  1673  Protein:  90-115 grams  Fluid:  > 1.9  L/day  EDUCATION NEEDS:   No education needs identified at this time  Joaquin CourtsKimberly Andrika Peraza, RD, LDN, CNSC Pager [phone] After Hours Pager [phone]

## 2016-07-29 DIAGNOSIS — L8990 Pressure ulcer of unspecified site, unspecified stage: Secondary | ICD-10-CM | POA: Diagnosis present

## 2016-07-29 LAB — BASIC METABOLIC PANEL
Anion gap: 8 (ref 5–15)
BUN: 6 mg/dL (ref 6–20)
CHLORIDE: 109 mmol/L (ref 101–111)
CO2: 27 mmol/L (ref 22–32)
CREATININE: 0.69 mg/dL (ref 0.61–1.24)
Calcium: 8.7 mg/dL — ABNORMAL LOW (ref 8.9–10.3)
GFR calc Af Amer: >60 mL/min (ref >60)
GFR calc non Af Amer: >60 mL/min (ref >60)
Glucose, Bld: 93 mg/dL (ref 65–99)
POTASSIUM: 3.6 mmol/L (ref 3.5–5.1)
Sodium: 144 mmol/L (ref 135–145)

## 2016-07-29 LAB — GLUCOSE, CAPILLARY
GLUCOSE-CAPILLARY: 96 mg/dL (ref 65–99)
Glucose-Capillary: 100 mg/dL — ABNORMAL HIGH (ref 65–99)
Glucose-Capillary: 111 mg/dL — ABNORMAL HIGH (ref 65–99)
Glucose-Capillary: 15 mg/dL — CL (ref 65–99)
Glucose-Capillary: 153 mg/dL — ABNORMAL HIGH (ref 65–99)
Glucose-Capillary: 92 mg/dL (ref 65–99)
Glucose-Capillary: 96 mg/dL (ref 65–99)

## 2016-07-29 LAB — CBC
HEMATOCRIT: 22.6 % — AB (ref 39.0–52.0)
Hemoglobin: 7.3 g/dL — ABNORMAL LOW (ref 13.0–17.0)
MCH: 29.3 pg (ref 26.0–34.0)
MCHC: 32.3 g/dL (ref 30.0–36.0)
MCV: 90.8 fL (ref 78.0–100.0)
PLATELETS: 291 10*3/uL (ref 150–400)
RBC: 2.49 MIL/uL — ABNORMAL LOW (ref 4.22–5.81)
RDW: 18.3 % — AB (ref 11.5–15.5)
WBC: 11.9 10*3/uL — AB (ref 4.0–10.5)

## 2016-07-29 LAB — PROTIME-INR
INR: 1.18
Prothrombin Time: 15 seconds (ref 11.4–15.2)

## 2016-07-29 LAB — VANCOMYCIN, TROUGH: Vancomycin Tr: 13 ug/mL — ABNORMAL LOW (ref 15–20)

## 2016-07-29 MED ORDER — HEPARIN SODIUM (PORCINE) 5000 UNIT/ML IJ SOLN
5000.0000 [IU] | Freq: Three times a day (TID) | INTRAMUSCULAR | Status: DC
  Administered 2016-07-29 – 2016-08-01 (×9): 5000 [IU] via SUBCUTANEOUS
  Filled 2016-07-29 (×9): qty 1

## 2016-07-29 MED ORDER — ORAL CARE MOUTH RINSE
15.0000 mL | Freq: Two times a day (BID) | OROMUCOSAL | Status: DC
  Administered 2016-07-30 – 2016-08-10 (×23): 15 mL via OROMUCOSAL

## 2016-07-29 MED ORDER — VITAL AF 1.2 CAL PO LIQD
1000.0000 mL | ORAL | Status: DC
  Administered 2016-07-29 – 2016-08-06 (×9): 1000 mL
  Filled 2016-07-29 (×11): qty 1000

## 2016-07-29 MED ORDER — DEXTROSE 50 % IV SOLN
25.0000 mL | Freq: Once | INTRAVENOUS | Status: AC
  Administered 2016-07-29: 25 mL via INTRAVENOUS

## 2016-07-29 MED ORDER — VANCOMYCIN HCL IN DEXTROSE 750-5 MG/150ML-% IV SOLN
750.0000 mg | Freq: Three times a day (TID) | INTRAVENOUS | Status: DC
  Filled 2016-07-29 (×2): qty 150

## 2016-07-29 MED ORDER — CHLORHEXIDINE GLUCONATE 0.12 % MT SOLN
15.0000 mL | Freq: Two times a day (BID) | OROMUCOSAL | Status: DC
  Administered 2016-07-29 – 2016-08-10 (×24): 15 mL via OROMUCOSAL
  Filled 2016-07-29 (×20): qty 15

## 2016-07-29 MED ORDER — HYDROCORTISONE NA SUCCINATE PF 100 MG IJ SOLR
50.0000 mg | Freq: Four times a day (QID) | INTRAMUSCULAR | Status: DC
  Administered 2016-07-29 – 2016-08-01 (×14): 50 mg via INTRAVENOUS
  Filled 2016-07-29 (×14): qty 2

## 2016-07-29 MED ORDER — VANCOMYCIN HCL IN DEXTROSE 750-5 MG/150ML-% IV SOLN
750.0000 mg | Freq: Three times a day (TID) | INTRAVENOUS | Status: DC
  Administered 2016-07-30 – 2016-08-01 (×7): 750 mg via INTRAVENOUS
  Filled 2016-07-29 (×13): qty 150

## 2016-07-29 MED ORDER — VANCOMYCIN HCL 500 MG IV SOLR
500.0000 mg | Freq: Once | INTRAVENOUS | Status: AC
  Administered 2016-07-29: 500 mg via INTRAVENOUS
  Filled 2016-07-29: qty 500

## 2016-07-29 MED ORDER — CHLORHEXIDINE GLUCONATE 0.12 % MT SOLN
OROMUCOSAL | Status: AC
  Administered 2016-07-29: 15 mL via OROMUCOSAL
  Filled 2016-07-29: qty 15

## 2016-07-29 MED ORDER — DEXTROSE 50 % IV SOLN
INTRAVENOUS | Status: AC
  Administered 2016-07-29: 25 mL via INTRAVENOUS
  Filled 2016-07-29: qty 50

## 2016-07-29 NOTE — Progress Notes (Signed)
Nutrition Follow-up / Consult  DOCUMENTATION CODES:   Severe malnutrition in context of chronic illness  INTERVENTION:    Resume Vital AF 1.2 at 60 ml/h to provide 1440 ml, 1728 kcal, 108 gm protein, 1167 ml free water daily.  NUTRITION DIAGNOSIS:   Malnutrition (Severe) related to chronic illness as evidenced by severe depletion of body fat, severe depletion of muscle mass.  Ongoing  GOAL:   Patient will meet greater than or equal to 90% of their needs  Unmet  MONITOR:   TF tolerance, Skin, I & O's, Vent status, Labs, Weight trends  REASON FOR ASSESSMENT:   Consult Enteral/tube feeding initiation and management  ASSESSMENT:   50 y.o. M with complex medical history and multiple recent admissions (strep pneumo meningitis, cardiac arrest, cardiogenic shock, ARDS, CVA, seizures) resides in nursing home, admitted again 8/8 with unresponsiveness and inability to protect airway, intubated in ED.  Hypotensive and hypothermic but has this chronically due to adrenal insufficiency.   Patient with trach in place on ventilator support. MV: 7.3 L/min Temp (24hrs), Avg:97.2 F (36.2 C), Min:94 F (34.4 C), Max:98.7 F (37.1 C)   Patient being treated for aspiration PNA. Cortrak tube has been replaced. Received MD Consult for TF initiation and management. Plans for g-tube placement by Radiology on Monday.  Diet Order:  Diet NPO time specified  Skin:  Reviewed, no issues (pressure ulcers have healed)  Last BM:  8/24 (flexiseal)  Height:   Ht Readings from Last 1 Encounters:  07/22/16 5\' 8"  (1.727 m)    Weight:   Wt Readings from Last 1 Encounters:  07/29/16 119 lb 4.3 oz (54.1 kg)  07/12/16 123 lb 7.3 oz (56 kg)  Ideal Body Weight:  70 kg  BMI:  Body mass index is 18.13 kg/m.  Estimated Nutritional Needs:   Kcal:  1673  Protein:  90-115 grams  Fluid:  > 1.9 L/day  EDUCATION NEEDS:   No education needs identified at this time  Joaquin CourtsKimberly Freedom Lopezperez, RD, LDN,  CNSC Pager [phone] After Hours Pager [phone]

## 2016-07-29 NOTE — Progress Notes (Signed)
Hypoglycemic Event  CBG: 15   Treatment: D50 IV 25 mL  Symptoms: None, altered LOC.   Follow-up CBG: ZOXW:9604Time:0825 CBG Result:151   Possible Reasons for Event: Inadequate meal intake  Comments/MD notified:resolved    Caprice KluverSoo H Bradyn Vassey

## 2016-07-29 NOTE — Progress Notes (Signed)
PULMONARY / CRITICAL CARE MEDICINE   Name: Dale [name] MRN: [id number] DOB: 07/16/1966    ADMISSION DATE:  07/12/2016 CONSULTATION DATE:  07/12/16  REFERRING MD:  [facility]  CHIEF COMPLAINT:  AMS   50 yo male from NH with frequent admissions admitted again 8/18 with altered mental status, inability to protect airway.  He was hypotensive and hypothermic from UTI. Acute resp failure and severe delirium. S/p Trach on 8/18.   He has PMHx of combined CHF, Streptococcal meningitis, ETOH, Depression, Seizures, Dementia, CVA.  SUBJECTIVE:   Low low low low low low dose neo PS weaning  VITAL SIGNS: BP 114/75   Pulse (!) 47   Temp (!) 94.8 F (34.9 C) (Rectal)   Resp 13   Ht 5\' 8"  (1.727 m)   Wt 54.1 kg (119 lb 4.3 oz)   SpO2 100%   BMI 18.13 kg/m   HEMODYNAMICS:    VENTILATOR SETTINGS: Vent Mode: CPAP;PSV FiO2 (%):  [40 %] 40 % Set Rate:  [14 bmp] 14 bmp Vt Set:  [550 mL] 550 mL PEEP:  [5 cmH20] 5 cmH20 Pressure Support:  [10 cmH20] 10 cmH20 Plateau Pressure:  [14 cmH20-16 cmH20] 14 cmH20  INTAKE / OUTPUT: I/O last 3 completed shifts: In: 2761.8 [I.V.:1751.8; NG/GT:60; IV Piggyback:950] Out: 2465 [Urine:2365; Stool:100]  PHYSICAL EXAMINATION: General: ill appearing, trached on vent Neuro: RASS 0, fc some HEENT: mm moist, no JVD, trach in place, clean Cardiac: s1 s2 RRR Chest: reduced Abd: soft, non tender. Ext: 1+ edema UE, 2+ LLE. Skin: sacral pressure ulcer.  LABS:  BMET  Recent Labs Lab 07/27/16 0532 07/28/16 0330 07/29/16 0415  NA 139 137 144  K 3.9 3.4* 3.6  CL 105 104 109  CO2 26 26 27   BUN 17 10 6   CREATININE 0.60* 0.63 0.69  GLUCOSE 108* 91 93   Electrolytes  Recent Labs Lab 07/25/16 0420 07/26/16 0536 07/27/16 0532 07/28/16 0330 07/29/16 0415  CALCIUM 8.3* 8.2* 8.2* 8.3* 8.7*  MG 1.8 2.4  --  1.7  --   PHOS 4.0 4.0  --  4.4  --     CBC  Recent Labs Lab 07/27/16 0532 07/28/16 0330 07/29/16 0415  WBC 8.9 10.8* 11.9*  HGB 7.7*  7.3* 7.3*  HCT 24.0* 23.5* 22.6*  PLT 312 298 291   Coag's  Recent Labs Lab 07/29/16 0415  INR 1.18   Sepsis Markers No results for input(s): LATICACIDVEN, PROCALCITON, O2SATVEN in the last 168 hours.  ABG  Recent Labs Lab 07/28/16 0455  PHART 7.437  PCO2ART 38.1  PO2ART 133*    Liver Enzymes No results for input(s): AST, ALT, ALKPHOS, BILITOT, ALBUMIN in the last 168 hours.  Cardiac Enzymes No results for input(s): TROPONINI, PROBNP in the last 168 hours.  Glucose  Recent Labs Lab 07/28/16 1558 07/28/16 1946 07/29/16 0040 07/29/16 0352 07/29/16 0802 07/29/16 0829  GLUCAP 91 85 96 92 15* <10*   Imaging No results found. STUDIES:  CT head 8/8 >> old b/l infarcts with subacute infarct again identified at inferior left caudate.  No acute process. EEG 8/10 >>suppressed backround MRI brain 8/11 >> multifocal acute Lt MCA ischemic infarcts, possible infarct Rt caudate head  CULTURES: 8/08 Urine >> Enterobacter 8/08 Blood >> NEG 8/09 Sputum >> MDR Enterobacter 8/09 Blood >> NEG 8/12 C diff >> antigen and toxin positive 8/23 Blood>>> 1/2 Biofire= MRSa vs MRSE>>> 8/23 Urine>>> 8/23 Sputum>>> GNR >>  ANTIBIOTICS: Vanc 8/8 >>8/10 Zosyn 8/8 >>8/11 meropenem 8/11>> 8/18 Oral  vancomycin 8/12 >> Unasyn 8/23>>> vanc IV 8/24>>>  SIGNIFICANT EVENTS: 8/08 admitted with unresponsiveness, required intubation. 8/09 low cvp, levophed, possible seizure  8/10 not moving rt ext upper and lowers 8/13 Following commands 8/15>> Following Commands, but agitated., Norepi off 8/15 am. 8/23 asp concerns  LINES/TUBES: ETT 8/8 > 8/18 Trach (JY) 8/18 >>  R IJ CVL 8/8 >>>8/25  DISCUSSION:   ASSESSMENT / PLAN:  PULMONARY A: Acute hypoxic respiratory failure - due to inability to protect airway. 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 Aspiration PNA 8/23 event P:   PS if successful at 10, consider trach collar Cuff up with secretions NO PMV with current  secretion status pcxr intermittent  CARDIOVASCULAR A:  Septic shock from UTI, HCAP, C diff colitis - resolved.  Chronic combined CHF, HLD. brady P:  MAP goal >55 ct neo gtt, low dose, dc as can cause reflex brady, if needed could restart dop Continue ASA, lipitor. Continue florinef. Hydrocort keep at 50 q6h until BP stablizes  RENAL A:   Hypernatremia resolved  P:   F/u BMET. Free water, decrease to 250 ml q6 hours- dc kvo  GASTROINTESTINAL A:   Protein calorie malnutrition.  P:   Restart TF Pepcid for SUP. IR to place G-tube monday  HEMATOLOGIC A:   Anemia of chronic disease and critical illness. No obvious signs of bleeding P:  F/u CBC in am  SQ heparin for DVT prophylaxis  INFECTIOUS A:   MDR gram neg UTI HCAP with Enterobacter >> MDR. C diff colitis. New aspiration PNA 8/23 GPC clusters in blood- contam 8/23 likely 1/2 P:   Continue enteral vancomycin, continued 10 days past empiric asp treatment Pan culture. Unasyn per pharmacy for likely aspiration PNA,  IV vanc   ENDOCRINE A:   Chronic adrenal insufficiency. Hypoglycemia per CBG's, not BMET P:   Florinef keep Stress steroids back to q6h SSI.  NEUROLOGIC A:   Acute encephalopathy. Hx seizures, strep pneumo meningitis, CVA. Agitated requiring increased sedation > now has developed oversedation 8/20 P:   Continue depakote, keppra  Decreased Seroquel to 100 mg BID 8/20 Daily WUA. Prn meds  Ccm time 30 min   Mcarthur Rossetti. Tyson Alias, MD, FACP Pgr: [phone] [facility]

## 2016-07-29 NOTE — Progress Notes (Signed)
Patient ID: Dale [name], male   DOB: 12/01/1966, 50 y.o.   MRN: [id number]          [facility] for Infectious Disease    Date of Admission:  07/12/2016           Day 14 oral vancomycin        Day 3 IV vancomycin        Day 3 IV ampicillin sulbactam  Active Problems:   MRSA bacteremia   C. difficile colitis   Aspiration pneumonia (HCC)   History of bacterial meningitis   Homeless   Tobacco abuse   ETOH abuse   Substance abuse   Cardiogenic shock (HCC)   Cardiac arrest ([place])   Respiratory failure (HCC)   Tracheostomy status (HCC)   NSTEMI (non-ST elevated myocardial infarction) (HCC)   Systolic and diastolic CHF, chronic (HCC)   Cerebral thrombosis with cerebral infarction   Moderate protein-calorie malnutrition (HCC)   Hyperlipidemia   Dysphagia   Seizure (HCC)   Adrenal insufficiency (Addison's disease) ([place])   Encephalopathy   Arterial hypotension   New onset a-fib (HCC)   Syphilis   Pressure ulcer   . ampicillin-sulbactam (UNASYN) IV  3 g Intravenous Q8H  . antiseptic oral rinse  7 mL Mouth Rinse 10 times per day  . aspirin  325 mg Per Tube Daily  . atorvastatin  20 mg Per Tube q1800  . [START ON 08/01/2016]  ceFAZolin (ANCEF) IV  2 g Intravenous to XRAY  . chlorhexidine gluconate (SAGE KIT)  15 mL Mouth Rinse BID  . famotidine  20 mg Per Tube BID  . fentaNYL (SUBLIMAZE) injection  200 mcg Intravenous Once  . fludrocortisone  0.1 mg Per Tube Daily  . folic acid  1 mg Per Tube Daily  . heparin  5,000 Units Subcutaneous Q8H  . [START ON 08/02/2016] heparin subcutaneous  5,000 Units Subcutaneous Q8H  . hydrocortisone sod succinate (SOLU-CORTEF) inj  50 mg Intravenous Q6H  . insulin aspart  0-20 Units Subcutaneous Q4H  . levETIRAcetam  1,500 mg Per Tube BID  . midazolam  4 mg Intravenous Once  . QUEtiapine  100 mg Per Tube BID  . sodium chloride flush  10-40 mL Intracatheter Q12H  . thiamine  100 mg Per Tube Daily  . Valproate Sodium  750 mg Per Tube Q8H    . vancomycin  500 mg Per Tube Q6H  . vancomycin  500 mg Intravenous Q8H   Review of Systems: Review of Systems  Unable to perform ROS: Intubated    Past Medical History:  Diagnosis Date  . Acute encephalopathy   . Acute ischemic stroke ([place])   . Acute renal failure ([place])   . Acute respiratory failure with hypoxia ([place])   . AKI (acute kidney injury) ([place]) 02/16/2016  . Altered mental status   . Cardiac arrest ([place])   . Cardiogenic shock ([place])   . 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 Cerebral thrombosis with cerebral infarction 03/25/2016  . CHF (congestive heart failure) ([place])   . Convulsions/seizures ([place]) 05/04/2016   Seizure disorder - Continue Keppra, Depakote.     . Dementia 04/27/2016  . Depression   . Dysphagia 05/04/2016   Dysphagia - Dysphagia 1 diet per SLP     . ETOH abuse   . Hydrocephalus   . Hyperlipidemia 05/04/2016   Dyslipidemia - Continue statin.     Marland Kitchen Hypernatremia   . Hypothermia 03/20/2016  . Low serum cortisol level ([place]) 05/04/2016  Low a.m. cortisol  - Was 1.2 checked on 04/03/2016  - This likely is reflective of cortisol suppression from steroid use - Tapered steroids off   . Meningitis, pneumococcal, recurrent   . Meningitis, streptococcal 02/16/2016  . Meningoencephalitis   . NSTEMI (non-ST elevated myocardial infarction) ([place]) 03/14/2016  . Protein-calorie malnutrition, severe ([place])   . Respiratory failure ([place]) 03/03/2016  . Septic shock ([place])   . Severe sepsis ([place])   . 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 Substance abuse   . Systolic and diastolic CHF, chronic ([place])   . Tobacco abuse 02/16/2016  . 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 Urinary retention 05/04/2016   Urinary retention - Continue Flomax       Social History  Substance Use Topics  . Smoking status: Current Every Day Smoker    Packs/day: 0.10    Types: Cigarettes  . Smokeless tobacco: Never Used  . Alcohol use No    History reviewed. No pertinent family history. No  Known Allergies  OBJECTIVE: Vitals:   07/29/16 0800 07/29/16 0845 07/29/16 0846 07/29/16 0900  BP: 115/73  134/72 114/75  Pulse: (!) 25 (!) 48 (!) 47 (!) 47  Resp: '17 14 14 13  '$ Temp: (!) 94.8 F (34.9 C)     TempSrc: Rectal     SpO2: 100% 100% 100% 100%  Weight:      Height:       Body mass index is 18.13 kg/m.  Physical Exam  Constitutional:  He remains unresponsive on the ventilator.  Cardiovascular: Regular rhythm.   No murmur heard. Bradycardic.  Pulmonary/Chest:  Clear breath sounds anteriorly.  Abdominal: Soft.  Diarrhea persists.  Musculoskeletal: Normal range of motion. He exhibits no edema or tenderness.  Skin: No rash noted.    Lab Results Lab Results  Component Value Date   WBC 11.9 (H) 07/29/2016   HGB 7.3 (L) 07/29/2016   HCT 22.6 (L) 07/29/2016   MCV 90.8 07/29/2016   PLT 291 07/29/2016    Lab Results  Component Value Date   CREATININE 0.69 07/29/2016   BUN 6 07/29/2016   NA 144 07/29/2016   K 3.6 07/29/2016   CL 109 07/29/2016   CO2 27 07/29/2016    Lab Results  Component Value Date   ALT 10 (L) 07/12/2016   AST 12 (L) 07/12/2016   ALKPHOS 47 07/12/2016   BILITOT 0.3 07/12/2016     Microbiology: Recent Results (from the past 240 hour(s))  Urine culture     Status: None (Preliminary result)   Collection Time: 07/27/16 11:15 AM  Result Value Ref Range Status   Specimen Description URINE, RANDOM  Final   Special Requests NONE  Final   Culture CULTURE REINCUBATED FOR BETTER GROWTH  Final   Report Status PENDING  Incomplete  Culture, respiratory (NON-Expectorated)     Status: None (Preliminary result)   Collection Time: 07/27/16 11:15 AM  Result Value Ref Range Status   Specimen Description TRACHEAL ASPIRATE  Final   Special Requests NONE  Final   Gram Stain   Final    MODERATE WBC PRESENT, PREDOMINANTLY PMN MODERATE GRAM POSITIVE COCCI IN PAIRS IN CLUSTERS FEW GRAM POSITIVE RODS RARE GRAM NEGATIVE RODS    Culture CULTURE  REINCUBATED FOR BETTER GROWTH  Final   Report Status PENDING  Incomplete  Culture, blood (Routine X 2) w Reflex to ID Panel     Status: None (Preliminary result)   Collection Time: 07/27/16 12:23 PM  Result Value Ref Range  Status   Specimen Description BLOOD RIGHT ARM  Final   Special Requests BOTTLES DRAWN AEROBIC AND ANAEROBIC 5 CC  Final   Culture  Setup Time   Final    GRAM POSITIVE COCCI IN CLUSTERS AEROBIC BOTTLE ONLY CRITICAL RESULT CALLED TO, READ BACK BY AND VERIFIED WITH: M. Bitonti Pharm.D. 11:30 07/28/16 ([email or web address])    Culture GRAM POSITIVE COCCI  Final   Report Status PENDING  Incomplete  Culture, blood (Routine X 2) w Reflex to ID Panel     Status: None (Preliminary result)   Collection Time: 07/27/16 12:23 PM  Result Value Ref Range Status   Specimen Description BLOOD RIGHT ARM  Final   Special Requests BOTTLES DRAWN AEROBIC AND ANAEROBIC 5 CC  Final   Culture NO GROWTH 1 DAY  Final   Report Status PENDING  Incomplete  Blood Culture ID Panel (Reflexed)     Status: Abnormal   Collection Time: 07/27/16 12:23 PM  Result Value Ref Range Status   Enterococcus species NOT DETECTED NOT DETECTED Final   Listeria monocytogenes NOT DETECTED NOT DETECTED Final   Staphylococcus species DETECTED (A) NOT DETECTED Final    Comment: CRITICAL RESULT CALLED TO, READ BACK BY AND VERIFIED WITH: M. Bitonti Pharm.D. 11:30 07/28/16 ([email or web address])    Staphylococcus aureus DETECTED (A) NOT DETECTED Final    Comment: CRITICAL RESULT CALLED TO, READ BACK BY AND VERIFIED WITH: M. Bitonti Pharm.D. 11:30 07/28/16  (wilsonm)    Methicillin resistance DETECTED (A) NOT DETECTED Final    Comment: CRITICAL RESULT CALLED TO, READ BACK BY AND VERIFIED WITH: M. Bitonti Pharm.D. 11:30 07/28/16 (wilsonm)    Streptococcus species NOT DETECTED NOT DETECTED Final   Streptococcus agalactiae NOT DETECTED NOT DETECTED Final   Streptococcus pneumoniae NOT DETECTED NOT DETECTED Final   Streptococcus pyogenes NOT  DETECTED NOT DETECTED Final   Acinetobacter baumannii NOT DETECTED NOT DETECTED Final   Enterobacteriaceae species NOT DETECTED NOT DETECTED Final   Enterobacter cloacae complex NOT DETECTED NOT DETECTED Final   Escherichia coli NOT DETECTED NOT DETECTED Final   Klebsiella oxytoca NOT DETECTED NOT DETECTED Final   Klebsiella pneumoniae NOT DETECTED NOT DETECTED Final   Proteus species NOT DETECTED NOT DETECTED Final   Serratia marcescens NOT DETECTED NOT DETECTED Final   Haemophilus influenzae NOT DETECTED NOT DETECTED Final   Neisseria meningitidis NOT DETECTED NOT DETECTED Final   Pseudomonas aeruginosa NOT DETECTED NOT DETECTED Final   Candida albicans NOT DETECTED NOT DETECTED Final   Candida glabrata NOT DETECTED NOT DETECTED Final   Candida krusei NOT DETECTED NOT DETECTED Final   Candida parapsilosis NOT DETECTED NOT DETECTED Final   Candida tropicalis NOT DETECTED NOT DETECTED Final     ASSESSMENT: He had a recent fever spike and has grown MRSA from one of 2 blood cultures. I would treat this as true bacteremia. Repeat blood cultures are negative so far. Once we know they are confirmed a negative I would recommend replacing his central line. I would recommend 4 more days of ampicillin sulbactam for possible aspiration pneumonia. Longer therapy will make it or difficult to control his C. difficile colitis. I believe this bacteremia was caught very quickly and treated appropriately. I do not feel strongly that he needs repeat echocardiography.  PLAN: 1. Continue current antibiotics 2. Recommend central line eplacement once blood cultures negative 3. Please call Dr. Lita Mains [phone]) for any infectious disease questions this weekend  Michel Bickers, MD [facility]  Group [phone] pager   [phone] cell 07/29/2016, 10:39 AM

## 2016-07-29 NOTE — Progress Notes (Addendum)
PHARMACY - PHYSICIAN COMMUNICATION CRITICAL VALUE ALERT - BLOOD CULTURE IDENTIFICATION (BCID)  Results for orders placed or performed during the hospital encounter of 07/12/16  Blood Culture ID Panel (Reflexed) (Collected: 07/27/2016 12:23 PM)  Result Value Ref Range   Enterococcus species NOT DETECTED NOT DETECTED   Listeria monocytogenes NOT DETECTED NOT DETECTED   Staphylococcus species DETECTED (A) NOT DETECTED   Staphylococcus aureus DETECTED (A) NOT DETECTED   Methicillin resistance DETECTED (A) NOT DETECTED   Streptococcus species NOT DETECTED NOT DETECTED   Streptococcus agalactiae NOT DETECTED NOT DETECTED   Streptococcus pneumoniae NOT DETECTED NOT DETECTED   Streptococcus pyogenes NOT DETECTED NOT DETECTED   Acinetobacter baumannii NOT DETECTED NOT DETECTED   Enterobacteriaceae species NOT DETECTED NOT DETECTED   Enterobacter cloacae complex NOT DETECTED NOT DETECTED   Escherichia coli NOT DETECTED NOT DETECTED   Klebsiella oxytoca NOT DETECTED NOT DETECTED   Klebsiella pneumoniae NOT DETECTED NOT DETECTED   Proteus species NOT DETECTED NOT DETECTED   Serratia marcescens NOT DETECTED NOT DETECTED   Haemophilus influenzae NOT DETECTED NOT DETECTED   Neisseria meningitidis NOT DETECTED NOT DETECTED   Pseudomonas aeruginosa NOT DETECTED NOT DETECTED   Candida albicans NOT DETECTED NOT DETECTED   Candida glabrata NOT DETECTED NOT DETECTED   Candida krusei NOT DETECTED NOT DETECTED   Candida parapsilosis NOT DETECTED NOT DETECTED   Candida tropicalis NOT DETECTED NOT DETECTED    Gram Positive Cocci in clusters in 8/24 blood cultures 1 of 2 sets.   Changes to prescribed antibiotics required: Patient on vancomycin. Continue current therapy.   Harland GermanAndrew Anaijah Augsburger, Pharm D 07/29/2016 6:50 PM

## 2016-07-29 NOTE — Progress Notes (Signed)
Pharmacy Antibiotic Note  Dale [name] is a 50 y.o. male admitted on 07/12/2016 with AMS and inability to protect airway. Now with  pneumonia on Unasyn, vancomycin and also on po vanc for cdiff.  -vancomycin trough= 13 at ~ 8pm (last dose given at ~ 12pm)  Plan:  No Unasyn changes needed Change vancomycin to 750mg  IV q8h Monitor renal function and clinical picture Follow-up culture results and length of therapy   Height: 5\' 8"  (172.7 cm) Weight: 119 lb 4.3 oz (54.1 kg) IBW/kg (Calculated) : 68.4  Temp (24hrs), Avg:97.7 F (36.5 C), Min:94.8 F (34.9 C), Max:98.7 F (37.1 C)   Recent Labs Lab 07/25/16 0420 07/26/16 0536 07/27/16 0532 07/28/16 0330 07/29/16 0415 07/29/16 1949  WBC 11.0* 10.7* 8.9 10.8* 11.9*  --   CREATININE 0.61 0.64 0.60* 0.63 0.69  --   VANCOTROUGH  --   --   --   --   --  13*    Estimated Creatinine Clearance: 83.6 mL/min (by C-G formula based on SCr of 0.8 mg/dL).    No Known Allergies  Antimicrobials this admission: Zosyn 8/9>>8/11 Vanc 8/9>>8/10 Primaxin 8/11>> no dose given, switch to Merrem for history of seizures Merrem 8/11>>8/18 PO vanc 8/13>> Unasyn 8/23>> Vanc 8/24>>  Microbiology results: 8/8 cdiff +(fromOSH) 8/9 BCx x2: neg Urine 8/8 > enterobacter R to zoysn, S imip (20K) 8/8 BCx x2: neg 8/8 Sputum: enterobacter 8/8 MRSA PCR: neg 8/12 C diff: antigen and toxin positive 8/23 blood cx: MRSA   8/23 sputum: GNR >  8/23 urine cx: pending  8/24 blood x2- ngtd  Thank you for allowing pharmacy to be a part of this patient's care.  Harland GermanAndrew Eleina Jergens, Pharm D 07/29/2016 8:56 PM

## 2016-07-29 NOTE — Progress Notes (Signed)
Patient ID: Dale HollowCraig D Mothershead, male   DOB: 09/07/1966, 50 y.o.   MRN: [id number] Patient noted to have MRSA bacteremia.  We will allow this to have treatment initiated.  We will tentatively plan for g-tube on Monday if patient is recovering well and overall his fever, WBC, and BP are improving.  Orders written for possible procedure on Monday.  Josel Keo E

## 2016-07-30 DIAGNOSIS — I5042 Chronic combined systolic (congestive) and diastolic (congestive) heart failure: Secondary | ICD-10-CM

## 2016-07-30 DIAGNOSIS — E2740 Unspecified adrenocortical insufficiency: Secondary | ICD-10-CM

## 2016-07-30 DIAGNOSIS — R569 Unspecified convulsions: Secondary | ICD-10-CM

## 2016-07-30 DIAGNOSIS — E785 Hyperlipidemia, unspecified: Secondary | ICD-10-CM

## 2016-07-30 LAB — CBC
HCT: 24.5 % — ABNORMAL LOW (ref 39.0–52.0)
Hemoglobin: 7.8 g/dL — ABNORMAL LOW (ref 13.0–17.0)
MCH: 28.9 pg (ref 26.0–34.0)
MCHC: 31.8 g/dL (ref 30.0–36.0)
MCV: 90.7 fL (ref 78.0–100.0)
PLATELETS: 303 10*3/uL (ref 150–400)
RBC: 2.7 MIL/uL — AB (ref 4.22–5.81)
RDW: 18.3 % — AB (ref 11.5–15.5)
WBC: 11.3 10*3/uL — AB (ref 4.0–10.5)

## 2016-07-30 LAB — CULTURE, RESPIRATORY

## 2016-07-30 LAB — CULTURE, BLOOD (ROUTINE X 2)

## 2016-07-30 LAB — GLUCOSE, CAPILLARY
GLUCOSE-CAPILLARY: 110 mg/dL — AB (ref 65–99)
GLUCOSE-CAPILLARY: 115 mg/dL — AB (ref 65–99)
GLUCOSE-CAPILLARY: 115 mg/dL — AB (ref 65–99)
Glucose-Capillary: 123 mg/dL — ABNORMAL HIGH (ref 65–99)
Glucose-Capillary: 78 mg/dL (ref 65–99)
Glucose-Capillary: 87 mg/dL (ref 65–99)

## 2016-07-30 LAB — BASIC METABOLIC PANEL
Anion gap: 11 (ref 5–15)
BUN: 16 mg/dL (ref 6–20)
CALCIUM: 8.7 mg/dL — AB (ref 8.9–10.3)
CHLORIDE: 112 mmol/L — AB (ref 101–111)
CO2: 22 mmol/L (ref 22–32)
CREATININE: 0.68 mg/dL (ref 0.61–1.24)
GFR calc non Af Amer: >60 mL/min (ref >60)
Glucose, Bld: 112 mg/dL — ABNORMAL HIGH (ref 65–99)
Potassium: 3.4 mmol/L — ABNORMAL LOW (ref 3.5–5.1)
SODIUM: 145 mmol/L (ref 135–145)

## 2016-07-30 LAB — ABO/RH: ABO/RH(D): B POS

## 2016-07-30 LAB — URINE CULTURE: Culture: >=100000 — AB

## 2016-07-30 LAB — CULTURE, RESPIRATORY W GRAM STAIN

## 2016-07-30 LAB — PREPARE RBC (CROSSMATCH)

## 2016-07-30 MED ORDER — SODIUM CHLORIDE 0.9 % IV SOLN
Freq: Once | INTRAVENOUS | Status: AC
Start: 2016-07-30 — End: 2016-07-30
  Administered 2016-07-30: 10:00:00 via INTRAVENOUS

## 2016-07-30 NOTE — Progress Notes (Signed)
PROGRESS NOTE    Dale [name]  ZOX:[id number] DOB: 24-Oct-1966 DOA: 07/12/2016 PCP: No primary care provider on file.   Brief Narrative:  50 year old male presented from [facility] PMHx of multiple admissions since April 2017. Depression, Dementia (04/27/2016); Convulsions/seizures (HCC) (05/04/2016;Meningoencephalitis, Meningitis, streptococcal (02/16/2016),Cerebral thrombosis with cerebral infarction (03/25/2016); Cerebral septic emboli (HCC, NSTEMI, Cardiac arrest (HCC); Cardiogenic shock (HCC); Chronic Systolic and diastolic CHF, Acute respiratory failure with hypoxia (HCC); ; Trichomonal urethritis in male (02/16/2016); Tobacco abuse (02/16/2016); Substance abuse; ETOH abuse;  Admitted again 8/18 with altered mental status, inability to protect airway.  He was hypotensive and hypothermic from UTI. Acute resp failure and severe delirium. S/p Trach on 8/18.    Subjective: 8/26 eyes open follow some commands.   Assessment & Plan:   Active Problems:   Homeless   History of bacterial meningitis   Tobacco abuse   ETOH abuse   Substance abuse   Cardiogenic shock (HCC)   Cardiac arrest (HCC)   Respiratory failure (HCC)   Tracheostomy status (HCC)   NSTEMI (non-ST elevated myocardial infarction) (HCC)   Systolic and diastolic CHF, chronic (HCC)   Cerebral thrombosis with cerebral infarction   Moderate protein-calorie malnutrition (HCC)   Hyperlipidemia   Dysphagia   Seizure (HCC)   Adrenal insufficiency (Addison's disease) (HCC)   Encephalopathy   Arterial hypotension   New onset a-fib (HCC)   MRSA bacteremia   C. difficile colitis   Aspiration pneumonia (HCC)   Syphilis   Pressure ulcer  Acute hypoxic respiratory failure  - due to inability to protect airway. -Hx tracheostomy March 2017 ( s/p decannulation.) -Aspiration PNA 8/23 event -Currently on vent support  Septic shock  -Multifactorial from UTI, HCAP, C diff colitis  IMDR gram neg UTI HCAP with Enterobacter  >> MDR. C diff colitis. New aspiration PNA 8/23 GPC clusters in blood- contam 8/23 likely 1/2 Pan culture. Unasyn per pharmacy for likely aspiration PNA,  IV vanc  - resolved.   Chronic combined CHF, HLD. -Patient was on Neo-Synephrine overnight to maintain goal MAP> 55. Now off.  -Continue florinef. -Hydrocort keep at 50 q6h until BP stablizes -Transfuse for hemoglobin<8 -8/26 Transfuse 1 unit PRBC  Hypernatremia  -resolved  Protein calorie malnutrition.  Anemia of chronic disease and critical illness.  Chronic adrenal insufficiency.   Acute encephalopathy. Hx seizures, strep pneumo meningitis, CVA. Agitated requiring increased sedation > now has developed oversedation 8/20 P:   Continue depakote, keppra  Decreased Seroquel to 100 mg BID 8/20 Daily WUA. Prn meds    Goals of care -8/26 PALLIATIVE CARE consult placed:Patient has been hospitalized almost consistently since April 2017 with short periods of time in between hospitalizations at [facility]. Has continued to decline. Address and obtain answer on CODE STATUS, palliative care vs hospice vs no return to hospital     DVT prophylaxis: Subcutaneous heparin Code Status: Full Family Communication:  Disposition Plan: Per palliative care   Consultants:  Palliative care  Procedures/Significant Events:  CT head 8/8 >> old b/l infarcts with subacute infarct again identified at inferior left caudate.  No acute process. EEG 8/10 >>suppressed backround MRI brain 8/11 >> multifocal acute Lt MCA ischemic infarcts, possible infarct Rt caudate head  8/08 admitted with unresponsiveness, required intubation. 8/09 low cvp, levophed, possible seizure  8/10 not moving rt ext upper and lowers 8/13 Following commands 8/15>> Following Commands, but agitated., Norepi off 8/15 am. 8/23 asp concerns   Cultures 8/08 Urine >> Enterobacter 8/08 Blood >>  NEG 8/09 Sputum >> MDR Enterobacter 8/09 Blood >> NEG 8/12 C diff >>  antigen and toxin positive 8/23 Blood>>> 1/2 Biofire= MRSa vs MRSE>>> 8/23 Urine>>> 8/23 Sputum>>> GNR >>  Antimicrobials: Vanc 8/8 >>8/10 Zosyn 8/8 >>8/11 meropenem 8/11>> 8/18 Oral vancomycin 8/12 >> Unasyn 8/23>>> vanc IV 8/24>>>   Devices    LINES / TUBES:  ETT 8/8 > 8/18 Trach (JY) 8/18 >>  R IJ CVL 8/8 >>>8/25    Continuous Infusions: . sodium chloride 10 mL/hr at 07/29/16 1100  . feeding supplement (VITAL AF 1.2 CAL) 1,000 mL (07/30/16 0643)  . phenylephrine (NEO-SYNEPHRINE) Adult infusion Stopped (07/30/16 0840)     Objective: Vitals:   07/30/16 0817 07/30/16 0902 07/30/16 0903 07/30/16 0912  BP:   (!) 88/59   Pulse:   (!) 54   Resp:   10   Temp: 97.8 F (36.6 C)     TempSrc: Axillary     SpO2:  100% 100% 100%  Weight:      Height:        Intake/Output Summary (Last 24 hours) at 07/30/16 0914 Last data filed at 07/30/16 0730  Gross per 24 hour  Intake          1612.11 ml  Output              490 ml  Net          1122.11 ml   Filed Weights   07/28/16 0330 07/29/16 0355 07/30/16 0545  Weight: 55.7 kg (122 lb 12.7 oz) 54.1 kg (119 lb 4.3 oz) 57.1 kg (125 lb 14.1 oz)    Examination:  General: No acute respiratory distress Eyes: negative scleral hemorrhage, negative anisocoria, negative icterus ENT: Negative Runny nose, negative gingival bleeding, Neck:  Negative scars, masses, torticollis, lymphadenopathy, JVD Lungs: Clear to auscultation bilaterally without wheezes or crackles Cardiovascular: Regular rate and rhythm without murmur gallop or rub normal S1 and S2 Abdomen: negative abdominal pain, nondistended, positive soft, bowel sounds, no rebound, no ascites, no appreciable mass Extremities: No significant cyanosis, clubbing, or edema bilateral lower extremities Skin: Negative rashes, lesions, ulcers Psychiatric:  Negative depression, negative anxiety, negative fatigue, negative mania  Central nervous system:  Cranial nerves II through XII  intact, tongue/uvula midline, all extremities muscle strength 5/5, sensation intact throughout, finger nose finger bilateral within normal limits, quick finger touch bilateral within normal limits, negative Romberg sign, heel to shin bilateral within normal limits, standing on 1 foot bilateral within normal limits, walking on tiptoes within normal limits, walking on heels within normal limits, negative dysarthria, negative expressive aphasia, negative receptive aphasia.  .     Data Reviewed: Care during the described time interval was provided by me .  I have reviewed this patient's available data, including medical history, events of note, physical examination, and all test results as part of my evaluation. I have personally reviewed and interpreted all radiology studies.  CBC:  Recent Labs Lab 07/26/16 0536 07/27/16 0532 07/28/16 0330 07/29/16 0415 07/30/16 0249  WBC 10.7* 8.9 10.8* 11.9* 11.3*  HGB 7.6* 7.7* 7.3* 7.3* 7.8*  HCT 23.9* 24.0* 23.5* 22.6* 24.5*  MCV 93.0 91.3 91.4 90.8 90.7  PLT 305 312 298 291 303   Basic Metabolic Panel:  Recent Labs Lab 07/25/16 0420 07/26/16 0536 07/27/16 0532 07/28/16 0330 07/29/16 0415 07/30/16 0249  NA 142 142 139 137 144 145  K 3.2* 4.0 3.9 3.4* 3.6 3.4*  CL 112* 110 105 104 109 112*  CO2 25 25  26 26 27 22   GLUCOSE 113* 87 108* 91 93 112*  BUN 15 14 17 10 6 16   CREATININE 0.61 0.64 0.60* 0.63 0.69 0.68  CALCIUM 8.3* 8.2* 8.2* 8.3* 8.7* 8.7*  MG 1.8 2.4  --  1.7  --   --   PHOS 4.0 4.0  --  4.4  --   --    GFR: Estimated Creatinine Clearance: 88.2 mL/min (by C-G formula based on SCr of 0.8 mg/dL). Liver Function Tests: No results for input(s): AST, ALT, ALKPHOS, BILITOT, PROT, ALBUMIN in the last 168 hours. No results for input(s): LIPASE, AMYLASE in the last 168 hours. No results for input(s): AMMONIA in the last 168 hours. Coagulation Profile:  Recent Labs Lab 07/29/16 0415  INR 1.18   Cardiac Enzymes: No results for  input(s): CKTOTAL, CKMB, CKMBINDEX, TROPONINI in the last 168 hours. BNP (last 3 results) No results for input(s): PROBNP in the last 8760 hours. HbA1C: No results for input(s): HGBA1C in the last 72 hours. CBG:  Recent Labs Lab 07/29/16 1605 07/29/16 2101 07/30/16 0005 07/30/16 0422 07/30/16 0816  GLUCAP 111* 96 115* 115* 123*   Lipid Profile: No results for input(s): CHOL, HDL, LDLCALC, TRIG, CHOLHDL, LDLDIRECT in the last 72 hours. Thyroid Function Tests: No results for input(s): TSH, T4TOTAL, FREET4, T3FREE, THYROIDAB in the last 72 hours. Anemia Panel: No results for input(s): VITAMINB12, FOLATE, FERRITIN, TIBC, IRON, RETICCTPCT in the last 72 hours. Urine analysis:    Component Value Date/Time   COLORURINE YELLOW 07/27/2016 1115   APPEARANCEUR CLOUDY (A) 07/27/2016 1115   LABSPEC 1.016 07/27/2016 1115   PHURINE 6.0 07/27/2016 1115   GLUCOSEU NEGATIVE 07/27/2016 1115   HGBUR NEGATIVE 07/27/2016 1115   BILIRUBINUR NEGATIVE 07/27/2016 1115   KETONESUR NEGATIVE 07/27/2016 1115   PROTEINUR NEGATIVE 07/27/2016 1115   UROBILINOGEN 1.0 11/13/2011 2010   NITRITE NEGATIVE 07/27/2016 1115   LEUKOCYTESUR TRACE (A) 07/27/2016 1115   Sepsis Labs: @LABRCNTIP (procalcitonin:4,lacticidven:4)  ) Recent Results (from the past 240 hour(s))  Urine culture     Status: Abnormal   Collection Time: 07/27/16 11:15 AM  Result Value Ref Range Status   Specimen Description URINE, RANDOM  Final   Special Requests NONE  Final   Culture (A)  Final    >=100,000 COLONIES/mL ENTEROCOCCUS FAECIUM VANCOMYCIN RESISTANT ENTEROCOCCUS    Report Status 07/30/2016 FINAL  Final   Organism ID, Bacteria ENTEROCOCCUS FAECIUM (A)  Final      Susceptibility   Enterococcus faecium - MIC*    AMPICILLIN >=32 RESISTANT Resistant     LEVOFLOXACIN >=8 RESISTANT Resistant     NITROFURANTOIN 128 RESISTANT Resistant     VANCOMYCIN >=32 RESISTANT Resistant     LINEZOLID 2 SENSITIVE Sensitive     * >=100,000  COLONIES/mL ENTEROCOCCUS FAECIUM  Culture, respiratory (NON-Expectorated)     Status: None (Preliminary result)   Collection Time: 07/27/16 11:15 AM  Result Value Ref Range Status   Specimen Description TRACHEAL ASPIRATE  Final   Special Requests NONE  Final   Gram Stain   Final    MODERATE WBC PRESENT, PREDOMINANTLY PMN MODERATE GRAM POSITIVE COCCI IN PAIRS IN CLUSTERS FEW GRAM POSITIVE RODS RARE GRAM NEGATIVE RODS    Culture   Final    ABUNDANT METHICILLIN RESISTANT STAPHYLOCOCCUS AUREUS ABUNDANT ENTEROBACTER SPECIES REPEATING SENSITIVITIES    Report Status PENDING  Incomplete   Organism ID, Bacteria METHICILLIN RESISTANT STAPHYLOCOCCUS AUREUS  Final      Susceptibility   Methicillin resistant staphylococcus aureus -  MIC*    CIPROFLOXACIN >=8 RESISTANT Resistant     ERYTHROMYCIN <=0.25 SENSITIVE Sensitive     GENTAMICIN <=0.5 SENSITIVE Sensitive     OXACILLIN >=4 RESISTANT Resistant     TETRACYCLINE <=1 SENSITIVE Sensitive     VANCOMYCIN <=0.5 SENSITIVE Sensitive     TRIMETH/SULFA <=10 SENSITIVE Sensitive     CLINDAMYCIN <=0.25 SENSITIVE Sensitive     RIFAMPIN <=0.5 SENSITIVE Sensitive     Inducible Clindamycin NEGATIVE Sensitive     * ABUNDANT METHICILLIN RESISTANT STAPHYLOCOCCUS AUREUS  Culture, blood (Routine X 2) w Reflex to ID Panel     Status: Abnormal   Collection Time: 07/27/16 12:23 PM  Result Value Ref Range Status   Specimen Description BLOOD RIGHT ARM  Final   Special Requests BOTTLES DRAWN AEROBIC AND ANAEROBIC 5 CC  Final   Culture  Setup Time   Final    GRAM POSITIVE COCCI IN CLUSTERS AEROBIC BOTTLE ONLY CRITICAL RESULT CALLED TO, READ BACK BY AND VERIFIED WITH: M. Bitonti Pharm.D. 11:30 07/28/16 ([email or web address])    Culture METHICILLIN RESISTANT STAPHYLOCOCCUS AUREUS (A)  Final   Report Status 07/30/2016 FINAL  Final   Organism ID, Bacteria METHICILLIN RESISTANT STAPHYLOCOCCUS AUREUS  Final      Susceptibility   Methicillin resistant staphylococcus aureus - MIC*     CIPROFLOXACIN >=8 RESISTANT Resistant     ERYTHROMYCIN <=0.25 SENSITIVE Sensitive     GENTAMICIN <=0.5 SENSITIVE Sensitive     OXACILLIN >=4 RESISTANT Resistant     TETRACYCLINE <=1 SENSITIVE Sensitive     VANCOMYCIN <=0.5 SENSITIVE Sensitive     TRIMETH/SULFA <=10 SENSITIVE Sensitive     CLINDAMYCIN <=0.25 SENSITIVE Sensitive     RIFAMPIN <=0.5 SENSITIVE Sensitive     Inducible Clindamycin NEGATIVE Sensitive     * METHICILLIN RESISTANT STAPHYLOCOCCUS AUREUS  Culture, blood (Routine X 2) w Reflex to ID Panel     Status: None (Preliminary result)   Collection Time: 07/27/16 12:23 PM  Result Value Ref Range Status   Specimen Description BLOOD RIGHT ARM  Final   Special Requests BOTTLES DRAWN AEROBIC AND ANAEROBIC 5 CC  Final   Culture NO GROWTH 2 DAYS  Final   Report Status PENDING  Incomplete  Blood Culture ID Panel (Reflexed)     Status: Abnormal   Collection Time: 07/27/16 12:23 PM  Result Value Ref Range Status   Enterococcus species NOT DETECTED NOT DETECTED Final   Listeria monocytogenes NOT DETECTED NOT DETECTED Final   Staphylococcus species DETECTED (A) NOT DETECTED Final    Comment: CRITICAL RESULT CALLED TO, READ BACK BY AND VERIFIED WITH: M. Bitonti Pharm.D. 11:30 07/28/16 ([email or web address])    Staphylococcus aureus DETECTED (A) NOT DETECTED Final    Comment: CRITICAL RESULT CALLED TO, READ BACK BY AND VERIFIED WITH: M. Bitonti Pharm.D. 11:30 07/28/16  ([email or web address])    Methicillin resistance DETECTED (A) NOT DETECTED Final    Comment: CRITICAL RESULT CALLED TO, READ BACK BY AND VERIFIED WITH: M. Bitonti Pharm.D. 11:30 07/28/16 (wilsonm)    Streptococcus species NOT DETECTED NOT DETECTED Final   Streptococcus agalactiae NOT DETECTED NOT DETECTED Final   Streptococcus pneumoniae NOT DETECTED NOT DETECTED Final   Streptococcus pyogenes NOT DETECTED NOT DETECTED Final   Acinetobacter baumannii NOT DETECTED NOT DETECTED Final   Enterobacteriaceae species NOT DETECTED NOT DETECTED  Final   Enterobacter cloacae complex NOT DETECTED NOT DETECTED Final   Escherichia coli NOT DETECTED NOT DETECTED Final   Klebsiella oxytoca NOT DETECTED NOT DETECTED Final  Klebsiella pneumoniae NOT DETECTED NOT DETECTED Final   Proteus species NOT DETECTED NOT DETECTED Final   Serratia marcescens NOT DETECTED NOT DETECTED Final   Haemophilus influenzae NOT DETECTED NOT DETECTED Final   Neisseria meningitidis NOT DETECTED NOT DETECTED Final   Pseudomonas aeruginosa NOT DETECTED NOT DETECTED Final   Candida albicans NOT DETECTED NOT DETECTED Final   Candida glabrata NOT DETECTED NOT DETECTED Final   Candida krusei NOT DETECTED NOT DETECTED Final   Candida parapsilosis NOT DETECTED NOT DETECTED Final   Candida tropicalis NOT DETECTED NOT DETECTED Final  Culture, blood (routine x 2)     Status: None (Preliminary result)   Collection Time: 07/28/16  6:16 PM  Result Value Ref Range Status   Specimen Description BLOOD LEFT ANTECUBITAL  Final   Special Requests BOTTLES DRAWN AEROBIC AND ANAEROBIC 10CC EA  Final   Culture NO GROWTH < 24 HOURS  Final   Report Status PENDING  Incomplete  Culture, blood (routine x 2)     Status: None (Preliminary result)   Collection Time: 07/28/16  6:24 PM  Result Value Ref Range Status   Specimen Description BLOOD RIGHT ANTECUBITAL  Final   Special Requests IN PEDIATRIC BOTTLE 1.5CC  Final   Culture  Setup Time   Final    GRAM POSITIVE COCCI IN CLUSTERS AEROBIC BOTTLE ONLY CRITICAL RESULT CALLED TO, READ BACK BY AND VERIFIED WITH: A MYER 07/29/16 @ 1841 M VESTAL    Culture GRAM POSITIVE COCCI  Final   Report Status PENDING  Incomplete         Radiology Studies: No results found.      Scheduled Meds: . ampicillin-sulbactam (UNASYN) IV  3 g Intravenous Q8H  . aspirin  325 mg Per Tube Daily  . atorvastatin  20 mg Per Tube q1800  . [START ON 08/01/2016]  ceFAZolin (ANCEF) IV  2 g Intravenous to XRAY  . chlorhexidine  15 mL Mouth Rinse BID  .  famotidine  20 mg Per Tube BID  . fentaNYL (SUBLIMAZE) injection  200 mcg Intravenous Once  . fludrocortisone  0.1 mg Per Tube Daily  . folic acid  1 mg Per Tube Daily  . heparin subcutaneous  5,000 Units Subcutaneous Q8H  . hydrocortisone sod succinate (SOLU-CORTEF) inj  50 mg Intravenous Q6H  . insulin aspart  0-20 Units Subcutaneous Q4H  . levETIRAcetam  1,500 mg Per Tube BID  . mouth rinse  15 mL Mouth Rinse q12n4p  . midazolam  4 mg Intravenous Once  . QUEtiapine  100 mg Per Tube BID  . sodium chloride flush  10-40 mL Intracatheter Q12H  . thiamine  100 mg Per Tube Daily  . Valproate Sodium  750 mg Per Tube Q8H  . vancomycin  500 mg Per Tube Q6H  . vancomycin  750 mg Intravenous Q8H   Continuous Infusions: . sodium chloride 10 mL/hr at 07/29/16 1100  . feeding supplement (VITAL AF 1.2 CAL) 1,000 mL (07/30/16 0643)  . phenylephrine (NEO-SYNEPHRINE) Adult infusion Stopped (07/30/16 0840)     LOS: 18 days    Time spent: 40 minutes    Daire Okimoto, Roselind MessierURTIS J, MD [facility] Pager [phone]   If 7PM-7AM, please contact night-coverage [email or web address] Password [email or web address] 07/30/2016, 9:14 AM

## 2016-07-30 NOTE — Progress Notes (Signed)
   07/30/16 0912  Vent Select  Invasive or Noninvasive Invasive  Adult Vent Y  Adult Ventilator Settings  Vent Type Servo i  Humidity HME  Vent Mode PRVC  Vt Set 550 mL  Set Rate 14 bmp  FiO2 (%) 40 %  I Time 0.8 Sec(s)  PEEP 5 cmH20  Adult Ventilator Measurements  SpO2 100 %  Adult Ventilator Alarms  Alarms On Y  Ve High Alarm 20 L/min  Ve Low Alarm 4 L/min  Resp Rate High Alarm 38 br/min  Resp Rate Low Alarm 8  PEEP Low Alarm 3 cmH2O  Press High Alarm 40 cmH2O  Placed patient back on previous settings due to braypnea.

## 2016-07-31 DIAGNOSIS — Z515 Encounter for palliative care: Secondary | ICD-10-CM

## 2016-07-31 DIAGNOSIS — J9621 Acute and chronic respiratory failure with hypoxia: Secondary | ICD-10-CM

## 2016-07-31 LAB — BASIC METABOLIC PANEL
Anion gap: 11 (ref 5–15)
BUN: 18 mg/dL (ref 6–20)
CALCIUM: 8.6 mg/dL — AB (ref 8.9–10.3)
CO2: 25 mmol/L (ref 22–32)
CREATININE: 0.67 mg/dL (ref 0.61–1.24)
Chloride: 110 mmol/L (ref 101–111)
GFR calc Af Amer: >60 mL/min (ref >60)
GLUCOSE: 96 mg/dL (ref 65–99)
POTASSIUM: 2.8 mmol/L — AB (ref 3.5–5.1)
SODIUM: 146 mmol/L — AB (ref 135–145)

## 2016-07-31 LAB — GLUCOSE, CAPILLARY
GLUCOSE-CAPILLARY: 107 mg/dL — AB (ref 65–99)
GLUCOSE-CAPILLARY: 108 mg/dL — AB (ref 65–99)
GLUCOSE-CAPILLARY: 84 mg/dL (ref 65–99)
Glucose-Capillary: 99 mg/dL (ref 65–99)
Glucose-Capillary: 96 mg/dL (ref 65–99)
Glucose-Capillary: 115 mg/dL — ABNORMAL HIGH (ref 65–99)
Glucose-Capillary: 104 mg/dL — ABNORMAL HIGH (ref 65–99)

## 2016-07-31 LAB — TYPE AND SCREEN
ABO/RH(D): B POS
Antibody Screen: NEGATIVE
UNIT DIVISION: 0

## 2016-07-31 LAB — MAGNESIUM: Magnesium: 1.9 mg/dL (ref 1.7–2.4)

## 2016-07-31 LAB — CBC
HCT: 28.5 % — ABNORMAL LOW (ref 39.0–52.0)
Hemoglobin: 9.1 g/dL — ABNORMAL LOW (ref 13.0–17.0)
MCH: 28.4 pg (ref 26.0–34.0)
MCHC: 31.9 g/dL (ref 30.0–36.0)
MCV: 89.1 fL (ref 78.0–100.0)
PLATELETS: 309 10*3/uL (ref 150–400)
RBC: 3.2 MIL/uL — ABNORMAL LOW (ref 4.22–5.81)
RDW: 17.3 % — AB (ref 11.5–15.5)
WBC: 7.8 10*3/uL (ref 4.0–10.5)

## 2016-07-31 LAB — CULTURE, BLOOD (ROUTINE X 2)

## 2016-07-31 LAB — POTASSIUM: Potassium: 4 mmol/L (ref 3.5–5.1)

## 2016-07-31 MED ORDER — POTASSIUM CHLORIDE 10 MEQ/100ML IV SOLN
10.0000 meq | INTRAVENOUS | Status: AC
  Administered 2016-07-31 (×5): 10 meq via INTRAVENOUS
  Filled 2016-07-31 (×4): qty 100

## 2016-07-31 MED ORDER — CEFEPIME HCL 2 G IJ SOLR
2.0000 g | Freq: Three times a day (TID) | INTRAMUSCULAR | Status: DC
  Administered 2016-07-31 – 2016-08-01 (×4): 2 g via INTRAVENOUS
  Filled 2016-07-31 (×9): qty 2

## 2016-07-31 MED ORDER — HALOPERIDOL LACTATE 5 MG/ML IJ SOLN
2.0000 mg | Freq: Four times a day (QID) | INTRAMUSCULAR | Status: DC | PRN
  Administered 2016-07-31 – 2016-08-01 (×2): 2 mg via INTRAVENOUS
  Filled 2016-07-31 (×2): qty 1

## 2016-07-31 MED ORDER — POTASSIUM CHLORIDE 10 MEQ/100ML IV SOLN
INTRAVENOUS | Status: AC
  Filled 2016-07-31: qty 100

## 2016-07-31 NOTE — Progress Notes (Signed)
Called wife Mickeal SkinnerShirley Melhorn at [phone] to provide update and attempt to move mtg forward due to pt's declining clinical status. I informed Mrs [name] that pt is not making progress weaning, decreasing responsiveness, and now with multi-drug resistant bacteremia, as well as in sputum and urine. Wife stated she was going to call their son and would I please call her back in 1 hr  When we spoke again she stated their children did not understand how sick he is and would not agree to a DNR and she did not feel she could make that decision without their approval. I offered to speak with them either on the phone or in person but she stated they would not do either. She did ask " don't you have something if people can't make a decision it goes out of their hands". We did talk briefly about ethics committee that helps people try and resolve their concerns as well as a policy that references futile care which I did share was my opinion  that is what I am seeing. I reiterated our goal was to continue to try and do everything we can for him but unfortunately CPR, defibrillation as well as abx at this point will not alter his trajectory towards EOL.  She verbalized if "it was up to me I would make him a DNR"; "he's just laying up there suffering".   This is a very difficult case for this unfortunate man with a dire prognosis and essentially almost non-stop hospitalizations since March, 2017. Despite aggressive care since this time he has continued to decline and we are running out of any options for cure. I recommended to spouse that she elect DNR, and comfort care but she again felt as though she could not do this without support from his children.   Per bedside RN, pt's sister called and verbalized distress that his spouse from whom he has been separated from for 20 years was making his decisions.  Palliative Medicine to stay involved but wife has clearly stated she will not change code status and has canceled appt  I scheduled with her for Wednesday to discuss these issues in person. Additionally she tells me her children will not participate in discussions about this with Palliative Medicine. Will staff with medical director of Palliative Medicine Team.  Eduard RouxSarah Khaniya Tenaglia, ANP-ACHPN

## 2016-07-31 NOTE — Progress Notes (Signed)
Pharmacy Antibiotic Note  Bonnita HollowCraig D Gancarz is a 50 y.o. male admitted on 07/12/2016 with AMS and inability to protect airway. Now with  pneumonia on Unasyn> being changed to cefepime d/t culture results, vancomycin and also on po vanc for cdiff.  vancomycin dose  Increased after VT 13 last week will recheck monday Plan:  Stop Unasyn Cefepime 2gm IV q8 Continue vancomycin to 750mg  IV q8h Check VT 8/28 Monitor renal function and clinical picture Follow-up culture results and length of therapy   Height: 5\' 8"  (172.7 cm) Weight: 129 lb 3 oz (58.6 kg) IBW/kg (Calculated) : 68.4  Temp (24hrs), Avg:97.2 F (36.2 C), Min:94 F (34.4 C), Max:98.8 F (37.1 C)   Recent Labs Lab 07/27/16 0532 07/28/16 0330 07/29/16 0415 07/29/16 1949 07/30/16 0249 07/31/16 0250  WBC 8.9 10.8* 11.9*  --  11.3* 7.8  CREATININE 0.60* 0.63 0.69  --  0.68 0.67  VANCOTROUGH  --   --   --  13*  --   --     Estimated Creatinine Clearance: 90.5 mL/min (by C-G formula based on SCr of 0.8 mg/dL).    No Known Allergies  Antimicrobials this admission: Zosyn 8/9>>8/11 Vanc 8/9>>8/10 Primaxin 8/11>> no dose given, switch to Merrem for history of seizures Merrem 8/11>>8/18 PO vanc 8/13>> Unasyn 8/23>>8/27 Vanc 8/24>> 8/27 cefepime>   Microbiology results: 8/8 cdiff +(fromOSH) 8/9 BCx x2: neg Urine 8/8 > enterobacter R to zoysn, S imip (20K) 8/8 BCx x2: neg 8/8 Sputum: enterobacter 8/8 MRSA PCR: neg 8/12 C diff: antigen and toxin positive 8/23 blood cx: MRSA   8/23 sputum: enterobacter S cefepime,+ MRSA 8/23 urine cx: VRE 8/24 blood x2- ngtd   Leota SauersLisa Nandana Krolikowski Pharm.D. CPP, BCPS Clinical Pharmacist [phone] 07/31/2016 12:56 PM

## 2016-07-31 NOTE — Progress Notes (Signed)
07/31/2016 Patient transfer from 2H to 2C at 1845. Patient is on the vent. He is alert, he is non ambulatory. Patient arrive on unit he is on contact. He have a old heal area under scrotum and sacrum. He have protective boot on foot and SCD patient feet are dry. He have feeding tube in his nose and on tube feeding. Place on telemetry. elink and central monitor was made aware. [name] AllianceNadine Kolt Mcwhirter RN.

## 2016-07-31 NOTE — Progress Notes (Addendum)
PROGRESS NOTE    Dale [name]  YWV:[id number] DOB: 21-Aug-1966 DOA: 07/12/2016 PCP: No primary care provider on file.   Brief Narrative:  50 year old BM presented from [facility] PMHx of multiple admissions since April 2017. Depression, Dementia (04/27/2016); Convulsions/seizures (HCC) (05/04/2016;Meningoencephalitis, Meningitis, streptococcal (02/16/2016),Cerebral thrombosis with cerebral infarction (03/25/2016); Cerebral septic emboli (HCC, NSTEMI, Cardiac arrest (HCC); Cardiogenic shock (HCC); Chronic Systolic and diastolic CHF, Acute respiratory failure with hypoxia (HCC); ; Trichomonal urethritis in male (02/16/2016); Tobacco abuse (02/16/2016); Substance abuse; ETOH abuse;  Admitted again 8/18 with altered mental status, inability to protect airway.  He was hypotensive and hypothermic from UTI. Acute resp failure and severe delirium. S/p Trach on 8/18.    Subjective: 8/27 eyes open follow some commands.   Assessment & Plan:   Active Problems:   Homeless   History of bacterial meningitis   Tobacco abuse   ETOH abuse   Substance abuse   Cardiogenic shock (HCC)   Cardiac arrest (HCC)   Respiratory failure (HCC)   Tracheostomy status (HCC)   NSTEMI (non-ST elevated myocardial infarction) (HCC)   Systolic and diastolic CHF, chronic (HCC)   Cerebral thrombosis with cerebral infarction   Moderate protein-calorie malnutrition (HCC)   Hyperlipidemia   Dysphagia   Seizure (HCC)   Adrenal insufficiency (Addison's disease) (HCC)   Encephalopathy   Arterial hypotension   New onset a-fib (HCC)   MRSA bacteremia   C. difficile colitis   Aspiration pneumonia (HCC)   Syphilis   Pressure ulcer  Acute on chronic hypoxic respiratory failure  - due to inability to protect airway. -Hx tracheostomy March 2017 ( s/p decannulation.) -Aspiration PNA 8/23 event -Currently on vent support  Septic shock  -Multifactorial from UTI, HCAP, C diff colitis  IMDR gram neg UTI HCAP with  Enterobacter >> MDR. C diff colitis. New aspiration PNA 8/23 GPC clusters in blood- contam 8/23 likely 1/2 Pan culture. Unasyn per pharmacy for likely aspiration PNA,  IV vanc  - resolved.   Chronic combined systolic and diastolic CHF, HLD. -Patient was on Neo-Synephrine overnight to maintain goal MAP> 55. Now off.  -Continue florinef. -Hydrocort keep at 50 q6h until BP stablizes -Strict in and out's admission +8.2 L -Daily weight. Filed Weights   07/29/16 0355 07/30/16 0545 07/31/16 0430  Weight: 54.1 kg (119 lb 4.3 oz) 57.1 kg (125 lb 14.1 oz) 58.6 kg (129 lb 3 oz)  -Transfuse for hemoglobin<8 -8/26 Transfuse 1 unit PRBC  Hypernatremia  -resolved  Hypokalemia -Potassium goal> 4 -Potassium IV 50 mEq  Protein calorie malnutrition.  Anemia of chronic disease and critical illness.  Chronic adrenal insufficiency.   Acute encephalopathy.  Seizures, strep pneumo meningitis, CVA. Continue depakote, keppra  Decreased Seroquel to 100 mg BID 8/20  Agitation -Haldol WNI:OEVO for HR<50 or if QT becomes prolonged    Goals of care -8/26 PALLIATIVE CARE consult placed:Patient has been hospitalized almost consistently since April 2017 with short periods of time in between hospitalizations at [facility]. Has continued to decline. Address and obtain answer on CODE STATUS, palliative care vs hospice vs no return to hospital     DVT prophylaxis: Subcutaneous heparin Code Status: Full Family Communication:  Disposition Plan: Per palliative care   Consultants:  Palliative care    Procedures/Significant Events:  CT head 8/8 >> old b/l infarcts with subacute infarct again identified at inferior left caudate.  No acute process. EEG 8/10 >>suppressed backround MRI brain 8/11 >> multifocal acute Lt MCA ischemic infarcts, possible infarct  Rt caudate head  8/08 admitted with unresponsiveness, required intubation. 8/09 low cvp, levophed, possible seizure  8/10 not moving rt ext  upper and lowers 8/13 Following commands 8/15>> Following Commands, but agitated., Norepi off 8/15 am. 8/23 asp concerns   Cultures 8/08 Urine >> Enterobacter 8/08 Blood >> NEG 8/09 Sputum >> MDR Enterobacter 8/09 Blood >> NEG 8/12 C diff >> antigen and toxin positive 8/23 Blood>>> 1/2 Biofire= MRSa vs MRSE>>> 8/23 Urine>>> 8/23 Sputum>>> GNR >>  Antimicrobials: Vanc 8/8 >>8/10 Zosyn 8/8 >>8/11 meropenem 8/11>> 8/18 Oral vancomycin 8/12 >> Unasyn 8/23>>> 8/27 vanc IV 8/24>>>  Cefepime 8/27>>   Devices    LINES / TUBES:  ETT 8/8 > 8/18 Trach (JY) 8/18 >>  R IJ CVL 8/8 >>>8/25    Continuous Infusions: . sodium chloride 10 mL/hr at 07/31/16 0507  . feeding supplement (VITAL AF 1.2 CAL) 1,000 mL (07/31/16 0507)  . phenylephrine (NEO-SYNEPHRINE) Adult infusion Stopped (07/30/16 0840)     Objective: Vitals:   07/31/16 0100 07/31/16 0400 07/31/16 0430 07/31/16 0725  BP: 109/72 114/72    Pulse: (!) 57 78  (!) 48  Resp: 14 18  14   Temp:  98.8 F (37.1 C)  97.8 F (36.6 C)  TempSrc:  Oral  Oral  SpO2: 100% 100%  100%  Weight:   58.6 kg (129 lb 3 oz)   Height:        Intake/Output Summary (Last 24 hours) at 07/31/16 0731 Last data filed at 07/31/16 0507  Gross per 24 hour  Intake          2761.67 ml  Output             1475 ml  Net          1286.67 ml   Filed Weights   07/29/16 0355 07/30/16 0545 07/31/16 0430  Weight: 54.1 kg (119 lb 4.3 oz) 57.1 kg (125 lb 14.1 oz) 58.6 kg (129 lb 3 oz)    Examination:  General: Eyes open tracks around room, moves LUE to command, positive acute respiratory distress (on vent) Eyes: negative scleral hemorrhage, negative anisocoria, negative icterus ENT: Negative Runny nose, negative gingival bleeding, Neck:  Negative scars, masses, torticollis, lymphadenopathy, JVD, right IJ in place negative sign of infection Lungs: Clear to auscultation bilaterally without wheezes or crackles Cardiovascular: Regular rate and rhythm  without murmur gallop or rub normal S1 and S2 Abdomen: negative abdominal pain, nondistended, positive soft, bowel sounds, no rebound, no ascites, no appreciable mass Extremities: No significant cyanosis, clubbing, or edema bilateral lower extremities Skin: Negative rashes, lesions, ulcers Psychiatric:  Negative depression, negative anxiety, negative fatigue, negative mania  Central nervous system:  Cranial nerves II through XII intact, tongue/uvula midline, able to move LUE to command and wiggle toes to command, negative use of RUE .     Data Reviewed: Care during the described time interval was provided by me .  I have reviewed this patient's available data, including medical history, events of note, physical examination, and all test results as part of my evaluation. I have personally reviewed and interpreted all radiology studies.  CBC:  Recent Labs Lab 07/27/16 0532 07/28/16 0330 07/29/16 0415 07/30/16 0249 07/31/16 0250  WBC 8.9 10.8* 11.9* 11.3* 7.8  HGB 7.7* 7.3* 7.3* 7.8* 9.1*  HCT 24.0* 23.5* 22.6* 24.5* 28.5*  MCV 91.3 91.4 90.8 90.7 89.1  PLT 312 298 291 303 309   Basic Metabolic Panel:  Recent Labs Lab 07/25/16 0420 07/26/16 0536 07/27/16 0532  07/28/16 0330 07/29/16 0415 07/30/16 0249 07/31/16 0250  NA 142 142 139 137 144 145 146*  K 3.2* 4.0 3.9 3.4* 3.6 3.4* 2.8*  CL 112* 110 105 104 109 112* 110  CO2 25 25 26 26 27 22 25   GLUCOSE 113* 87 108* 91 93 112* 96  BUN 15 14 17 10 6 16 18   CREATININE 0.61 0.64 0.60* 0.63 0.69 0.68 0.67  CALCIUM 8.3* 8.2* 8.2* 8.3* 8.7* 8.7* 8.6*  MG 1.8 2.4  --  1.7  --   --   --   PHOS 4.0 4.0  --  4.4  --   --   --    GFR: Estimated Creatinine Clearance: 90.5 mL/min (by C-G formula based on SCr of 0.8 mg/dL). Liver Function Tests: No results for input(s): AST, ALT, ALKPHOS, BILITOT, PROT, ALBUMIN in the last 168 hours. No results for input(s): LIPASE, AMYLASE in the last 168 hours. No results for input(s): AMMONIA in  the last 168 hours. Coagulation Profile:  Recent Labs Lab 07/29/16 0415  INR 1.18   Cardiac Enzymes: No results for input(s): CKTOTAL, CKMB, CKMBINDEX, TROPONINI in the last 168 hours. BNP (last 3 results) No results for input(s): PROBNP in the last 8760 hours. HbA1C: No results for input(s): HGBA1C in the last 72 hours. CBG:  Recent Labs Lab 07/30/16 1159 07/30/16 1647 07/30/16 1948 07/31/16 0003 07/31/16 0330  GLUCAP 78 110* 87 107* 99   Lipid Profile: No results for input(s): CHOL, HDL, LDLCALC, TRIG, CHOLHDL, LDLDIRECT in the last 72 hours. Thyroid Function Tests: No results for input(s): TSH, T4TOTAL, FREET4, T3FREE, THYROIDAB in the last 72 hours. Anemia Panel: No results for input(s): VITAMINB12, FOLATE, FERRITIN, TIBC, IRON, RETICCTPCT in the last 72 hours. Urine analysis:    Component Value Date/Time   COLORURINE YELLOW 07/27/2016 1115   APPEARANCEUR CLOUDY (A) 07/27/2016 1115   LABSPEC 1.016 07/27/2016 1115   PHURINE 6.0 07/27/2016 1115   GLUCOSEU NEGATIVE 07/27/2016 1115   HGBUR NEGATIVE 07/27/2016 1115   BILIRUBINUR NEGATIVE 07/27/2016 1115   KETONESUR NEGATIVE 07/27/2016 1115   PROTEINUR NEGATIVE 07/27/2016 1115   UROBILINOGEN 1.0 11/13/2011 2010   NITRITE NEGATIVE 07/27/2016 1115   LEUKOCYTESUR TRACE (A) 07/27/2016 1115   Sepsis Labs: @LABRCNTIP (procalcitonin:4,lacticidven:4)  ) Recent Results (from the past 240 hour(s))  Urine culture     Status: Abnormal   Collection Time: 07/27/16 11:15 AM  Result Value Ref Range Status   Specimen Description URINE, RANDOM  Final   Special Requests NONE  Final   Culture (A)  Final    >=100,000 COLONIES/mL ENTEROCOCCUS FAECIUM VANCOMYCIN RESISTANT ENTEROCOCCUS    Report Status 07/30/2016 FINAL  Final   Organism ID, Bacteria ENTEROCOCCUS FAECIUM (A)  Final      Susceptibility   Enterococcus faecium - MIC*    AMPICILLIN >=32 RESISTANT Resistant     LEVOFLOXACIN >=8 RESISTANT Resistant     NITROFURANTOIN  128 RESISTANT Resistant     VANCOMYCIN >=32 RESISTANT Resistant     LINEZOLID 2 SENSITIVE Sensitive     * >=100,000 COLONIES/mL ENTEROCOCCUS FAECIUM  Culture, respiratory (NON-Expectorated)     Status: None   Collection Time: 07/27/16 11:15 AM  Result Value Ref Range Status   Specimen Description TRACHEAL ASPIRATE  Final   Special Requests NONE  Final   Gram Stain   Final    MODERATE WBC PRESENT, PREDOMINANTLY PMN MODERATE GRAM POSITIVE COCCI IN PAIRS IN CLUSTERS FEW GRAM POSITIVE RODS RARE GRAM NEGATIVE RODS  Culture   Final    ABUNDANT METHICILLIN RESISTANT STAPHYLOCOCCUS AUREUS ABUNDANT ENTEROBACTER SPECIES    Report Status 07/30/2016 FINAL  Final   Organism ID, Bacteria METHICILLIN RESISTANT STAPHYLOCOCCUS AUREUS  Final   Organism ID, Bacteria ENTEROBACTER SPECIES  Final      Susceptibility   Enterobacter species - MIC*    CEFAZOLIN >=64 RESISTANT Resistant     CEFEPIME 4 SENSITIVE Sensitive     CEFTAZIDIME >=64 RESISTANT Resistant     CEFTRIAXONE >=64 RESISTANT Resistant     CIPROFLOXACIN 1 SENSITIVE Sensitive     GENTAMICIN >=16 RESISTANT Resistant     IMIPENEM 1 SENSITIVE Sensitive     TRIMETH/SULFA <=20 SENSITIVE Sensitive     PIP/TAZO >=128 RESISTANT Resistant     * ABUNDANT ENTEROBACTER SPECIES   Methicillin resistant staphylococcus aureus - MIC*    CIPROFLOXACIN >=8 RESISTANT Resistant     ERYTHROMYCIN <=0.25 SENSITIVE Sensitive     GENTAMICIN <=0.5 SENSITIVE Sensitive     OXACILLIN >=4 RESISTANT Resistant     TETRACYCLINE <=1 SENSITIVE Sensitive     VANCOMYCIN <=0.5 SENSITIVE Sensitive     TRIMETH/SULFA <=10 SENSITIVE Sensitive     CLINDAMYCIN <=0.25 SENSITIVE Sensitive     RIFAMPIN <=0.5 SENSITIVE Sensitive     Inducible Clindamycin NEGATIVE Sensitive     * ABUNDANT METHICILLIN RESISTANT STAPHYLOCOCCUS AUREUS  Culture, blood (Routine X 2) w Reflex to ID Panel     Status: Abnormal   Collection Time: 07/27/16 12:23 PM  Result Value Ref Range Status    Specimen Description BLOOD RIGHT ARM  Final   Special Requests BOTTLES DRAWN AEROBIC AND ANAEROBIC 5 CC  Final   Culture  Setup Time   Final    GRAM POSITIVE COCCI IN CLUSTERS AEROBIC BOTTLE ONLY CRITICAL RESULT CALLED TO, READ BACK BY AND VERIFIED WITH: M. Bitonti Pharm.D. 11:30 07/28/16 ([email or web address])    Culture METHICILLIN RESISTANT STAPHYLOCOCCUS AUREUS (A)  Final   Report Status 07/30/2016 FINAL  Final   Organism ID, Bacteria METHICILLIN RESISTANT STAPHYLOCOCCUS AUREUS  Final      Susceptibility   Methicillin resistant staphylococcus aureus - MIC*    CIPROFLOXACIN >=8 RESISTANT Resistant     ERYTHROMYCIN <=0.25 SENSITIVE Sensitive     GENTAMICIN <=0.5 SENSITIVE Sensitive     OXACILLIN >=4 RESISTANT Resistant     TETRACYCLINE <=1 SENSITIVE Sensitive     VANCOMYCIN <=0.5 SENSITIVE Sensitive     TRIMETH/SULFA <=10 SENSITIVE Sensitive     CLINDAMYCIN <=0.25 SENSITIVE Sensitive     RIFAMPIN <=0.5 SENSITIVE Sensitive     Inducible Clindamycin NEGATIVE Sensitive     * METHICILLIN RESISTANT STAPHYLOCOCCUS AUREUS  Culture, blood (Routine X 2) w Reflex to ID Panel     Status: None (Preliminary result)   Collection Time: 07/27/16 12:23 PM  Result Value Ref Range Status   Specimen Description BLOOD RIGHT ARM  Final   Special Requests BOTTLES DRAWN AEROBIC AND ANAEROBIC 5 CC  Final   Culture NO GROWTH 3 DAYS  Final   Report Status PENDING  Incomplete  Blood Culture ID Panel (Reflexed)     Status: Abnormal   Collection Time: 07/27/16 12:23 PM  Result Value Ref Range Status   Enterococcus species NOT DETECTED NOT DETECTED Final   Listeria monocytogenes NOT DETECTED NOT DETECTED Final   Staphylococcus species DETECTED (A) NOT DETECTED Final    Comment: CRITICAL RESULT CALLED TO, READ BACK BY AND VERIFIED WITH: M. Bitonti Pharm.D. 11:30 07/28/16 ([email or web address])    Staphylococcus aureus  DETECTED (A) NOT DETECTED Final    Comment: CRITICAL RESULT CALLED TO, READ BACK BY AND VERIFIED WITH: M. Bitonti  Pharm.D. 11:30 07/28/16  ([email or web address])    Methicillin resistance DETECTED (A) NOT DETECTED Final    Comment: CRITICAL RESULT CALLED TO, READ BACK BY AND VERIFIED WITH: M. Bitonti Pharm.D. 11:30 07/28/16 ([email or web address])    Streptococcus species NOT DETECTED NOT DETECTED Final   Streptococcus agalactiae NOT DETECTED NOT DETECTED Final   Streptococcus pneumoniae NOT DETECTED NOT DETECTED Final   Streptococcus pyogenes NOT DETECTED NOT DETECTED Final   Acinetobacter baumannii NOT DETECTED NOT DETECTED Final   Enterobacteriaceae species NOT DETECTED NOT DETECTED Final   Enterobacter cloacae complex NOT DETECTED NOT DETECTED Final   Escherichia coli NOT DETECTED NOT DETECTED Final   Klebsiella oxytoca NOT DETECTED NOT DETECTED Final   Klebsiella pneumoniae NOT DETECTED NOT DETECTED Final   Proteus species NOT DETECTED NOT DETECTED Final   Serratia marcescens NOT DETECTED NOT DETECTED Final   Haemophilus influenzae NOT DETECTED NOT DETECTED Final   Neisseria meningitidis NOT DETECTED NOT DETECTED Final   Pseudomonas aeruginosa NOT DETECTED NOT DETECTED Final   Candida albicans NOT DETECTED NOT DETECTED Final   Candida glabrata NOT DETECTED NOT DETECTED Final   Candida krusei NOT DETECTED NOT DETECTED Final   Candida parapsilosis NOT DETECTED NOT DETECTED Final   Candida tropicalis NOT DETECTED NOT DETECTED Final  Culture, blood (routine x 2)     Status: None (Preliminary result)   Collection Time: 07/28/16  6:16 PM  Result Value Ref Range Status   Specimen Description BLOOD LEFT ANTECUBITAL  Final   Special Requests BOTTLES DRAWN AEROBIC AND ANAEROBIC 10CC EA  Final   Culture NO GROWTH 2 DAYS  Final   Report Status PENDING  Incomplete  Culture, blood (routine x 2)     Status: Abnormal (Preliminary result)   Collection Time: 07/28/16  6:24 PM  Result Value Ref Range Status   Specimen Description BLOOD RIGHT ANTECUBITAL  Final   Special Requests IN PEDIATRIC BOTTLE 1.5CC  Final   Culture  Setup Time    Final    GRAM POSITIVE COCCI IN CLUSTERS AEROBIC BOTTLE ONLY CRITICAL RESULT CALLED TO, READ BACK BY AND VERIFIED WITH: A MYER 07/29/16 @ 1841 M VESTAL    Culture (A)  Final    STAPHYLOCOCCUS AUREUS SUSCEPTIBILITIES PERFORMED ON PREVIOUS CULTURE WITHIN THE LAST 5 DAYS.    Report Status PENDING  Incomplete         Radiology Studies: No results found.      Scheduled Meds: . ampicillin-sulbactam (UNASYN) IV  3 g Intravenous Q8H  . aspirin  325 mg Per Tube Daily  . atorvastatin  20 mg Per Tube q1800  . [START ON 08/01/2016]  ceFAZolin (ANCEF) IV  2 g Intravenous to XRAY  . chlorhexidine  15 mL Mouth Rinse BID  . famotidine  20 mg Per Tube BID  . fentaNYL (SUBLIMAZE) injection  200 mcg Intravenous Once  . fludrocortisone  0.1 mg Per Tube Daily  . folic acid  1 mg Per Tube Daily  . heparin subcutaneous  5,000 Units Subcutaneous Q8H  . hydrocortisone sod succinate (SOLU-CORTEF) inj  50 mg Intravenous Q6H  . insulin aspart  0-20 Units Subcutaneous Q4H  . levETIRAcetam  1,500 mg Per Tube BID  . mouth rinse  15 mL Mouth Rinse q12n4p  . midazolam  4 mg Intravenous Once  . QUEtiapine  100 mg Per Tube BID  . thiamine  100  mg Per Tube Daily  . Valproate Sodium  750 mg Per Tube Q8H  . vancomycin  500 mg Per Tube Q6H  . vancomycin  750 mg Intravenous Q8H   Continuous Infusions: . sodium chloride 10 mL/hr at 07/31/16 0507  . feeding supplement (VITAL AF 1.2 CAL) 1,000 mL (07/31/16 0507)  . phenylephrine (NEO-SYNEPHRINE) Adult infusion Stopped (07/30/16 0840)     LOS: 19 days    Time spent: 40 minutes    Neshawn Aird, Roselind Messier, MD [facility] Pager [phone]   If 7PM-7AM, please contact night-coverage [email or web address] Password [facility] 07/31/2016, 7:31 AM

## 2016-08-01 ENCOUNTER — Encounter (HOSPITAL_COMMUNITY): Payer: Self-pay | Admitting: Interventional Radiology

## 2016-08-01 ENCOUNTER — Inpatient Hospital Stay (HOSPITAL_COMMUNITY): Payer: Medicaid Other

## 2016-08-01 DIAGNOSIS — R7881 Bacteremia: Secondary | ICD-10-CM

## 2016-08-01 HISTORY — PX: IR GENERIC HISTORICAL: IMG1180011

## 2016-08-01 LAB — GLUCOSE, CAPILLARY
GLUCOSE-CAPILLARY: 106 mg/dL — AB (ref 65–99)
GLUCOSE-CAPILLARY: 67 mg/dL (ref 65–99)
GLUCOSE-CAPILLARY: 125 mg/dL — AB (ref 65–99)
Glucose-Capillary: 100 mg/dL — ABNORMAL HIGH (ref 65–99)
Glucose-Capillary: 75 mg/dL (ref 65–99)
Glucose-Capillary: 83 mg/dL (ref 65–99)

## 2016-08-01 LAB — BASIC METABOLIC PANEL
Anion gap: 10 (ref 5–15)
BUN: 16 mg/dL (ref 6–20)
CHLORIDE: 107 mmol/L (ref 101–111)
CO2: 26 mmol/L (ref 22–32)
CREATININE: 0.61 mg/dL (ref 0.61–1.24)
Calcium: 8.6 mg/dL — ABNORMAL LOW (ref 8.9–10.3)
GFR calc Af Amer: >60 mL/min (ref >60)
GFR calc non Af Amer: >60 mL/min (ref >60)
GLUCOSE: 119 mg/dL — AB (ref 65–99)
POTASSIUM: 2.9 mmol/L — AB (ref 3.5–5.1)
SODIUM: 143 mmol/L (ref 135–145)

## 2016-08-01 LAB — VANCOMYCIN, TROUGH: Vancomycin Tr: 24 ug/mL (ref 15–20)

## 2016-08-01 LAB — CBC
HEMATOCRIT: 28 % — AB (ref 39.0–52.0)
Hemoglobin: 9.1 g/dL — ABNORMAL LOW (ref 13.0–17.0)
MCH: 28.3 pg (ref 26.0–34.0)
MCHC: 32.5 g/dL (ref 30.0–36.0)
MCV: 87.2 fL (ref 78.0–100.0)
PLATELETS: 266 10*3/uL (ref 150–400)
RBC: 3.21 MIL/uL — ABNORMAL LOW (ref 4.22–5.81)
RDW: 17.5 % — AB (ref 11.5–15.5)
WBC: 7.9 10*3/uL (ref 4.0–10.5)

## 2016-08-01 LAB — MAGNESIUM: Magnesium: 1.7 mg/dL (ref 1.7–2.4)

## 2016-08-01 MED ORDER — FENTANYL CITRATE (PF) 100 MCG/2ML IJ SOLN
INTRAMUSCULAR | Status: AC | PRN
  Administered 2016-08-01: 50 ug via INTRAVENOUS

## 2016-08-01 MED ORDER — POTASSIUM CHLORIDE 10 MEQ/100ML IV SOLN
10.0000 meq | INTRAVENOUS | Status: AC
Start: 2016-08-01 — End: 2016-08-01
  Administered 2016-08-01 (×4): 10 meq via INTRAVENOUS
  Filled 2016-08-01 (×4): qty 100

## 2016-08-01 MED ORDER — HEPARIN SODIUM (PORCINE) 5000 UNIT/ML IJ SOLN
5000.0000 [IU] | Freq: Three times a day (TID) | INTRAMUSCULAR | Status: DC
  Administered 2016-08-02 – 2016-08-10 (×25): 5000 [IU] via SUBCUTANEOUS
  Filled 2016-08-01 (×26): qty 1

## 2016-08-01 MED ORDER — CEFAZOLIN SODIUM-DEXTROSE 2-4 GM/100ML-% IV SOLN
INTRAVENOUS | Status: AC
  Filled 2016-08-01: qty 100

## 2016-08-01 MED ORDER — LORAZEPAM 2 MG/ML IJ SOLN
1.0000 mg | INTRAMUSCULAR | Status: DC | PRN
  Administered 2016-08-01: 1 mg via INTRAVENOUS
  Filled 2016-08-01: qty 1

## 2016-08-01 MED ORDER — MAGNESIUM SULFATE 2 GM/50ML IV SOLN
2.0000 g | Freq: Once | INTRAVENOUS | Status: AC
  Administered 2016-08-01: 2 g via INTRAVENOUS
  Filled 2016-08-01: qty 50

## 2016-08-01 MED ORDER — IOPAMIDOL (ISOVUE-300) INJECTION 61%
INTRAVENOUS | Status: AC
Start: 2016-08-01 — End: 2016-08-02
  Filled 2016-08-01: qty 50

## 2016-08-01 MED ORDER — HYDROCORTISONE 20 MG PO TABS
50.0000 mg | ORAL_TABLET | Freq: Four times a day (QID) | ORAL | Status: DC
  Filled 2016-08-01: qty 1

## 2016-08-01 MED ORDER — VANCOMYCIN HCL IN DEXTROSE 1-5 GM/200ML-% IV SOLN
1000.0000 mg | Freq: Two times a day (BID) | INTRAVENOUS | Status: DC
  Administered 2016-08-01: 1000 mg via INTRAVENOUS
  Filled 2016-08-01 (×2): qty 200

## 2016-08-01 MED ORDER — MIDAZOLAM HCL 2 MG/2ML IJ SOLN
INTRAMUSCULAR | Status: AC
  Filled 2016-08-01: qty 2

## 2016-08-01 MED ORDER — VANCOMYCIN HCL IN DEXTROSE 1-5 GM/200ML-% IV SOLN
1000.0000 mg | Freq: Two times a day (BID) | INTRAVENOUS | Status: DC
  Administered 2016-08-02 – 2016-08-03 (×3): 1000 mg via INTRAVENOUS
  Filled 2016-08-01 (×4): qty 200

## 2016-08-01 MED ORDER — HYDROCORTISONE 5 MG/ML ORAL SUSPENSION
50.0000 mg | Freq: Four times a day (QID) | ORAL | Status: DC
  Filled 2016-08-01 (×2): qty 10

## 2016-08-01 MED ORDER — HYDROCORTISONE 20 MG PO TABS
50.0000 mg | ORAL_TABLET | Freq: Four times a day (QID) | ORAL | Status: DC
  Administered 2016-08-01 – 2016-08-05 (×15): 50 mg
  Filled 2016-08-01 (×17): qty 1

## 2016-08-01 MED ORDER — MIDAZOLAM HCL 2 MG/2ML IJ SOLN
INTRAMUSCULAR | Status: AC | PRN
  Administered 2016-08-01: 1 mg via INTRAVENOUS

## 2016-08-01 MED ORDER — DEXTROSE 5 % IV SOLN
2.0000 g | Freq: Three times a day (TID) | INTRAVENOUS | Status: AC
  Administered 2016-08-01 – 2016-08-08 (×22): 2 g via INTRAVENOUS
  Filled 2016-08-01 (×23): qty 2

## 2016-08-01 MED ORDER — LIDOCAINE HCL 1 % IJ SOLN
INTRAMUSCULAR | Status: AC
  Filled 2016-08-01: qty 20

## 2016-08-01 MED ORDER — FENTANYL CITRATE (PF) 100 MCG/2ML IJ SOLN
25.0000 ug | INTRAMUSCULAR | Status: DC | PRN
  Administered 2016-08-01: 50 ug via INTRAVENOUS
  Filled 2016-08-01: qty 2

## 2016-08-01 MED ORDER — FENTANYL CITRATE (PF) 100 MCG/2ML IJ SOLN
INTRAMUSCULAR | Status: AC
  Filled 2016-08-01: qty 2

## 2016-08-01 MED ORDER — POTASSIUM CHLORIDE 20 MEQ/15ML (10%) PO SOLN
40.0000 meq | Freq: Three times a day (TID) | ORAL | Status: AC
Start: 2016-08-01 — End: 2016-08-02
  Administered 2016-08-01 – 2016-08-02 (×3): 40 meq
  Filled 2016-08-01 (×3): qty 30

## 2016-08-01 NOTE — Progress Notes (Signed)
[facility] TEAM 1 - Stepdown/ICU TEAM  ALAKAI MACBRIDE  ZOX:[id number] DOB: November 20, 1966 DOA: 07/12/2016 PCP: No primary care provider on file.    Brief Narrative:  50 year old M who presented from [facility] w/ a Hx of multiple admissions since April 2017, Depression, Dementia; Seizures; streptococcal Meningoencephalitis/Meningitis, Cerebral thrombosis with cerebral infarction; Cerebral septic emboli, NSTEMI, Cardiac arrest w/ Cardiogenic shock; Chronic Systolic and diastolic CHF, Acute respiratory failure with hypoxia; Substance abuse; and ETOH abuse who was admitted again 8/18 with altered mental status and an inability to protect his airway. He was hypotensive and hypothermic from UTI, acute resp failure, and severe delirium.  Subjective: The patient is alert but noncommunicative.  His nurse tells me he actually is much more alert today than he was yesterday.  He does not appear uncomfortable.  There is no respiratory distress or evidence of uncontrolled pain.  Assessment & Plan:  Acute on chronic hypoxic respiratory failure  -due to inability to protect airway -Hx tracheostomy March 2017 (s/p decannulation) -Aspiration PNA 8/23 event -ongiong vent weaning per PCCM   Septic shock due to multiple infections See each issue below - septic shock has resolved at this time   VRE faecium UTI v/s colonization  Follow clinically w/o change in abx - pt appears stable at this time   Enterobacter + MRSA HCAP Remains on abx coverage w/ normal WBC and no fever - stop abx and follow clinically    C diff colitis Cont enteral vanc   MRSA bacteremia  Plan to complete 10 days of IV Vanc  Chronic adrenal insufficiency Slowly taper steroids back to usual maintenance dose  Chronic combined systolic and diastolic CHF ?accuracy of weight - no evidence on exam of signif volume overload   Filed Weights   07/30/16 0545 07/31/16 0430 08/01/16 0359  Weight: 57.1 kg (125 lb 14.1 oz) 58.6 kg (129 lb  3 oz) 64 kg (141 lb 1.5 oz)   Hypokalemia Replace and follow - push Mg to 2.0  Protein calorie malnutrition Cont tube feeds - should be able to d/c NG tube and transition to PEG in AM 8/29  Anemia of chronic disease and critical illness Hgb stable/improving   Recent Labs Lab 07/28/16 0330 07/29/16 0415 07/30/16 0249 07/31/16 0250 08/01/16 0119  HGB 7.3* 7.3* 7.8* 9.1* 9.1*   Encephalopathy ?at his recent baseline   Seizures  Strep pneumo meningitis March 2017  CVA  DVT prophylaxis: SQ heparin  Code Status: FULL CODE Family Communication: no family present at time of exam  Disposition Plan: SDU   Consultants:  Palliative Care PCCM  Procedures/Events: 8/08 admitted with unresponsiveness, required intubation 8/09 low cvp, levophed, possible seizure  8/10 not moving rt ext upper and lowers - EEG suppressed background  8/11 MRI brain multifocal acute Lt MCA ischemic infarcts, possible infarct Rt caudate head 8/13 Following commands 8/15 Following Commands, but agitated, Norepi off 8/15 am 8/18 trach  8/23 asp concerns  Antimicrobials:  Vanc 8/8 >>8/10 Zosyn 8/8 >>8/11 meropenem 8/11>> 8/18 Oral vancomycin 8/12 > Unasyn 8/23>>> 8/27 Vanc IV 8/24 >  Cefepime 8/27>> 8/28  Objective: Blood pressure (!) 156/90, pulse (!) 53, temperature 97.4 F (36.3 C), temperature source Axillary, resp. rate (!) 23, height 5\' 8"  (1.727 m), weight 64 kg (141 lb 1.5 oz), SpO2 100 %.  Intake/Output Summary (Last 24 hours) at 08/01/16 1427 Last data filed at 08/01/16 1351  Gross per 24 hour  Intake  1264 ml  Output             4725 ml  Net            -3461 ml   Filed Weights   07/30/16 0545 07/31/16 0430 08/01/16 0359  Weight: 57.1 kg (125 lb 14.1 oz) 58.6 kg (129 lb 3 oz) 64 kg (141 lb 1.5 oz)    Examination: General: No acute respiratory distress evident  Lungs: Clear to auscultation bilaterally without wheezes or crackles Cardiovascular: Regular rate and  rhythm without murmur gallop or rub normal S1 and S2 Abdomen: Nontender, nondistended, soft, bowel sounds positive, no rebound, no ascites, no appreciable mass - PEG in place  Extremities: No significant cyanosis, clubbing, or edema bilateral lower extremities  CBC:  Recent Labs Lab 07/28/16 0330 07/29/16 0415 07/30/16 0249 07/31/16 0250 08/01/16 0119  WBC 10.8* 11.9* 11.3* 7.8 7.9  HGB 7.3* 7.3* 7.8* 9.1* 9.1*  HCT 23.5* 22.6* 24.5* 28.5* 28.0*  MCV 91.4 90.8 90.7 89.1 87.2  PLT 298 291 303 309 266   Basic Metabolic Panel:  Recent Labs Lab 07/26/16 0536  07/28/16 0330 07/29/16 0415 07/30/16 0249 07/31/16 0250 07/31/16 1337 08/01/16 0119  NA 142  < > 137 144 145 146*  --  143  K 4.0  < > 3.4* 3.6 3.4* 2.8* 4.0 2.9*  CL 110  < > 104 109 112* 110  --  107  CO2 25  < > 26 27 22 25   --  26  GLUCOSE 87  < > 91 93 112* 96  --  119*  BUN 14  < > 10 6 16 18   --  16  CREATININE 0.64  < > 0.63 0.69 0.68 0.67  --  0.61  CALCIUM 8.2*  < > 8.3* 8.7* 8.7* 8.6*  --  8.6*  MG 2.4  --  1.7  --   --   --  1.9 1.7  PHOS 4.0  --  4.4  --   --   --   --   --   < > = values in this interval not displayed. GFR: Estimated Creatinine Clearance: 98.9 mL/min (by C-G formula based on SCr of 0.8 mg/dL).  Liver Function Tests: No results for input(s): AST, ALT, ALKPHOS, BILITOT, PROT, ALBUMIN in the last 168 hours. No results for input(s): LIPASE, AMYLASE in the last 168 hours. No results for input(s): AMMONIA in the last 168 hours.  Coagulation Profile:  Recent Labs Lab 07/29/16 0415  INR 1.18   HbA1C: Hgb A1c MFr Bld  Date/Time Value Ref Range Status  05/20/2016 04:20 AM 5.3 4.8 - 5.6 % Final    Comment:    (NOTE)         Pre-diabetes: 5.7 - 6.4         Diabetes: >6.4         Glycemic control for adults with diabetes: <7.0   03/27/2016 05:35 AM 5.8 (H) 4.8 - 5.6 % Final    Comment:    (NOTE)         Pre-diabetes: 5.7 - 6.4         Diabetes: >6.4         Glycemic control for  adults with diabetes: <7.0     CBG:  Recent Labs Lab 07/31/16 1936 07/31/16 2359 08/01/16 0403 08/01/16 0733 08/01/16 1137  GLUCAP 84 108* 106* 100* 75    Recent Results (from the past 240 hour(s))  Urine culture  Status: Abnormal   Collection Time: 07/27/16 11:15 AM  Result Value Ref Range Status   Specimen Description URINE, RANDOM  Final   Special Requests NONE  Final   Culture (A)  Final    >=100,000 COLONIES/mL ENTEROCOCCUS FAECIUM VANCOMYCIN RESISTANT ENTEROCOCCUS    Report Status 07/30/2016 FINAL  Final   Organism ID, Bacteria ENTEROCOCCUS FAECIUM (A)  Final      Susceptibility   Enterococcus faecium - MIC*    AMPICILLIN >=32 RESISTANT Resistant     LEVOFLOXACIN >=8 RESISTANT Resistant     NITROFURANTOIN 128 RESISTANT Resistant     VANCOMYCIN >=32 RESISTANT Resistant     LINEZOLID 2 SENSITIVE Sensitive     * >=100,000 COLONIES/mL ENTEROCOCCUS FAECIUM  Culture, respiratory (NON-Expectorated)     Status: None   Collection Time: 07/27/16 11:15 AM  Result Value Ref Range Status   Specimen Description TRACHEAL ASPIRATE  Final   Special Requests NONE  Final   Gram Stain   Final    MODERATE WBC PRESENT, PREDOMINANTLY PMN MODERATE GRAM POSITIVE COCCI IN PAIRS IN CLUSTERS FEW GRAM POSITIVE RODS RARE GRAM NEGATIVE RODS    Culture   Final    ABUNDANT METHICILLIN RESISTANT STAPHYLOCOCCUS AUREUS ABUNDANT ENTEROBACTER SPECIES    Report Status 07/30/2016 FINAL  Final   Organism ID, Bacteria METHICILLIN RESISTANT STAPHYLOCOCCUS AUREUS  Final   Organism ID, Bacteria ENTEROBACTER SPECIES  Final      Susceptibility   Enterobacter species - MIC*    CEFAZOLIN >=64 RESISTANT Resistant     CEFEPIME 4 SENSITIVE Sensitive     CEFTAZIDIME >=64 RESISTANT Resistant     CEFTRIAXONE >=64 RESISTANT Resistant     CIPROFLOXACIN 1 SENSITIVE Sensitive     GENTAMICIN >=16 RESISTANT Resistant     IMIPENEM 1 SENSITIVE Sensitive     TRIMETH/SULFA <=20 SENSITIVE Sensitive      PIP/TAZO >=128 RESISTANT Resistant     * ABUNDANT ENTEROBACTER SPECIES   Methicillin resistant staphylococcus aureus - MIC*    CIPROFLOXACIN >=8 RESISTANT Resistant     ERYTHROMYCIN <=0.25 SENSITIVE Sensitive     GENTAMICIN <=0.5 SENSITIVE Sensitive     OXACILLIN >=4 RESISTANT Resistant     TETRACYCLINE <=1 SENSITIVE Sensitive     VANCOMYCIN <=0.5 SENSITIVE Sensitive     TRIMETH/SULFA <=10 SENSITIVE Sensitive     CLINDAMYCIN <=0.25 SENSITIVE Sensitive     RIFAMPIN <=0.5 SENSITIVE Sensitive     Inducible Clindamycin NEGATIVE Sensitive     * ABUNDANT METHICILLIN RESISTANT STAPHYLOCOCCUS AUREUS  Culture, blood (Routine X 2) w Reflex to ID Panel     Status: Abnormal   Collection Time: 07/27/16 12:23 PM  Result Value Ref Range Status   Specimen Description BLOOD RIGHT ARM  Final   Special Requests BOTTLES DRAWN AEROBIC AND ANAEROBIC 5 CC  Final   Culture  Setup Time   Final    GRAM POSITIVE COCCI IN CLUSTERS AEROBIC BOTTLE ONLY CRITICAL RESULT CALLED TO, READ BACK BY AND VERIFIED WITH: M. Bitonti Pharm.D. 11:30 07/28/16 ([email or web address])    Culture METHICILLIN RESISTANT STAPHYLOCOCCUS AUREUS (A)  Final   Report Status 07/30/2016 FINAL  Final   Organism ID, Bacteria METHICILLIN RESISTANT STAPHYLOCOCCUS AUREUS  Final      Susceptibility   Methicillin resistant staphylococcus aureus - MIC*    CIPROFLOXACIN >=8 RESISTANT Resistant     ERYTHROMYCIN <=0.25 SENSITIVE Sensitive     GENTAMICIN <=0.5 SENSITIVE Sensitive     OXACILLIN >=4 RESISTANT Resistant     TETRACYCLINE <=1 SENSITIVE  Sensitive     VANCOMYCIN <=0.5 SENSITIVE Sensitive     TRIMETH/SULFA <=10 SENSITIVE Sensitive     CLINDAMYCIN <=0.25 SENSITIVE Sensitive     RIFAMPIN <=0.5 SENSITIVE Sensitive     Inducible Clindamycin NEGATIVE Sensitive     * METHICILLIN RESISTANT STAPHYLOCOCCUS AUREUS  Culture, blood (Routine X 2) w Reflex to ID Panel     Status: None (Preliminary result)   Collection Time: 07/27/16 12:23 PM  Result Value Ref  Range Status   Specimen Description BLOOD RIGHT ARM  Final   Special Requests BOTTLES DRAWN AEROBIC AND ANAEROBIC 5 CC  Final   Culture NO GROWTH 4 DAYS  Final   Report Status PENDING  Incomplete  Blood Culture ID Panel (Reflexed)     Status: Abnormal   Collection Time: 07/27/16 12:23 PM  Result Value Ref Range Status   Enterococcus species NOT DETECTED NOT DETECTED Final   Listeria monocytogenes NOT DETECTED NOT DETECTED Final   Staphylococcus species DETECTED (A) NOT DETECTED Final    Comment: CRITICAL RESULT CALLED TO, READ BACK BY AND VERIFIED WITH: M. Bitonti Pharm.D. 11:30 07/28/16 ([email or web address])    Staphylococcus aureus DETECTED (A) NOT DETECTED Final    Comment: CRITICAL RESULT CALLED TO, READ BACK BY AND VERIFIED WITH: M. Bitonti Pharm.D. 11:30 07/28/16  ([email or web address])    Methicillin resistance DETECTED (A) NOT DETECTED Final    Comment: CRITICAL RESULT CALLED TO, READ BACK BY AND VERIFIED WITH: M. Bitonti Pharm.D. 11:30 07/28/16 ([email or web address])    Streptococcus species NOT DETECTED NOT DETECTED Final   Streptococcus agalactiae NOT DETECTED NOT DETECTED Final   Streptococcus pneumoniae NOT DETECTED NOT DETECTED Final   Streptococcus pyogenes NOT DETECTED NOT DETECTED Final   Acinetobacter baumannii NOT DETECTED NOT DETECTED Final   Enterobacteriaceae species NOT DETECTED NOT DETECTED Final   Enterobacter cloacae complex NOT DETECTED NOT DETECTED Final   Escherichia coli NOT DETECTED NOT DETECTED Final   Klebsiella oxytoca NOT DETECTED NOT DETECTED Final   Klebsiella pneumoniae NOT DETECTED NOT DETECTED Final   Proteus species NOT DETECTED NOT DETECTED Final   Serratia marcescens NOT DETECTED NOT DETECTED Final   Haemophilus influenzae NOT DETECTED NOT DETECTED Final   Neisseria meningitidis NOT DETECTED NOT DETECTED Final   Pseudomonas aeruginosa NOT DETECTED NOT DETECTED Final   Candida albicans NOT DETECTED NOT DETECTED Final   Candida glabrata NOT DETECTED NOT DETECTED Final   Candida  krusei NOT DETECTED NOT DETECTED Final   Candida parapsilosis NOT DETECTED NOT DETECTED Final   Candida tropicalis NOT DETECTED NOT DETECTED Final  Culture, blood (routine x 2)     Status: None (Preliminary result)   Collection Time: 07/28/16  6:16 PM  Result Value Ref Range Status   Specimen Description BLOOD LEFT ANTECUBITAL  Final   Special Requests BOTTLES DRAWN AEROBIC AND ANAEROBIC 10CC EA  Final   Culture NO GROWTH 3 DAYS  Final   Report Status PENDING  Incomplete  Culture, blood (routine x 2)     Status: Abnormal   Collection Time: 07/28/16  6:24 PM  Result Value Ref Range Status   Specimen Description BLOOD RIGHT ANTECUBITAL  Final   Special Requests IN PEDIATRIC BOTTLE 1.5CC  Final   Culture  Setup Time   Final    GRAM POSITIVE COCCI IN CLUSTERS AEROBIC BOTTLE ONLY CRITICAL RESULT CALLED TO, READ BACK BY AND VERIFIED WITH: A MYER 07/29/16 @ 1841 M VESTAL    Culture (A)  Final    STAPHYLOCOCCUS AUREUS SUSCEPTIBILITIES PERFORMED ON  PREVIOUS CULTURE WITHIN THE LAST 5 DAYS.    Report Status 07/31/2016 FINAL  Final     Scheduled Meds: . aspirin  325 mg Per Tube Daily  . atorvastatin  20 mg Per Tube q1800  . ceFEPime (MAXIPIME) IV  2 g Intravenous Q8H  . chlorhexidine  15 mL Mouth Rinse BID  . famotidine  20 mg Per Tube BID  . fentaNYL (SUBLIMAZE) injection  200 mcg Intravenous Once  . fludrocortisone  0.1 mg Per Tube Daily  . folic acid  1 mg Per Tube Daily  . [START ON 08/02/2016] heparin subcutaneous  5,000 Units Subcutaneous Q8H  . hydrocortisone sod succinate (SOLU-CORTEF) inj  50 mg Intravenous Q6H  . insulin aspart  0-20 Units Subcutaneous Q4H  . levETIRAcetam  1,500 mg Per Tube BID  . mouth rinse  15 mL Mouth Rinse q12n4p  . midazolam  4 mg Intravenous Once  . QUEtiapine  100 mg Per Tube BID  . thiamine  100 mg Per Tube Daily  . Valproate Sodium  750 mg Per Tube Q8H  . vancomycin  500 mg Per Tube Q6H  . vancomycin  1,000 mg Intravenous Q12H    LOS: 20 days    Lonia Blood, MD [facility] Office  [phone] Pager - Text Page per Loretha Stapler as per below:  On-Call/Text Page:      [email or web address]      password [email or web address]  If 7PM-7AM, please contact night-coverage [email or web address] Password [facility] 08/01/2016, 2:27 PM

## 2016-08-01 NOTE — Sedation Documentation (Signed)
Patient is resting comfortably. 

## 2016-08-01 NOTE — Sedation Documentation (Signed)
Vital signs stable. 

## 2016-08-01 NOTE — Progress Notes (Signed)
Patient ID: Dale [name], male   DOB: 06/01/1966, 50 y.o.   MRN: [id number]          [facility]    Date of Admission:  07/12/2016           Day 17 oral vancomycin        Day 6 IV vancomycin        Day 1 cefepime  Active Problems:   MRSA bacteremia   C. difficile colitis   Aspiration pneumonia (HCC)   History of bacterial meningitis   Homeless   Tobacco abuse   ETOH abuse   Substance abuse   Cardiogenic shock (HCC)   Cardiac arrest (HCC)   Acute on chronic respiratory failure with hypoxia (HCC)   Tracheostomy status (HCC)   NSTEMI (non-ST elevated myocardial infarction) (HCC)   Chronic combined systolic and diastolic CHF (congestive heart failure) (HCC)   Cerebral thrombosis with cerebral infarction   Moderate protein-calorie malnutrition (HCC)   HLD (hyperlipidemia)   Dysphagia   Seizures (HCC)   Chronic adrenal insufficiency (HCC)   Encephalopathy   Arterial hypotension   New onset a-fib (HCC)   Syphilis   Pressure ulcer   . aspirin  325 mg Per Tube Daily  . atorvastatin  20 mg Per Tube q1800  .  ceFAZolin (ANCEF) IV  2 g Intravenous to XRAY  . ceFEPime (MAXIPIME) IV  2 g Intravenous Q8H  . chlorhexidine  15 mL Mouth Rinse BID  . famotidine  20 mg Per Tube BID  . fentaNYL (SUBLIMAZE) injection  200 mcg Intravenous Once  . fludrocortisone  0.1 mg Per Tube Daily  . folic acid  1 mg Per Tube Daily  . [START ON 08/02/2016] heparin subcutaneous  5,000 Units Subcutaneous Q8H  . hydrocortisone sod succinate (SOLU-CORTEF) inj  50 mg Intravenous Q6H  . insulin aspart  0-20 Units Subcutaneous Q4H  . levETIRAcetam  1,500 mg Per Tube BID  . mouth rinse  15 mL Mouth Rinse q12n4p  . midazolam  4 mg Intravenous Once  . QUEtiapine  100 mg Per Tube BID  . thiamine  100 mg Per Tube Daily  . Valproate Sodium  750 mg Per Tube Q8H  . vancomycin  500 mg Per Tube Q6H  . vancomycin  750 mg Intravenous Q8H   Review of Systems: Review of Systems  Unable  to perform ROS: Intubated    Past Medical History:  Diagnosis Date  . Acute encephalopathy   . Acute ischemic stroke (HCC)   . Acute renal failure (HCC)   . Acute respiratory failure with hypoxia (HCC)   . AKI (acute kidney injury) (HCC) 02/16/2016  . Altered mental status   . Cardiac arrest (HCC)   . Cardiogenic shock (HCC)   . Cerebral septic emboli (HCC)   . Cerebral thrombosis with cerebral infarction 03/25/2016  . CHF (congestive heart failure) (HCC)   . Convulsions/seizures (HCC) 05/04/2016   Seizure disorder - Continue Keppra, Depakote.     . Dementia 04/27/2016  . Depression   . Dysphagia 05/04/2016   Dysphagia - Dysphagia 1 diet per SLP     . ETOH abuse   . Hydrocephalus   . Hyperlipidemia 05/04/2016   Dyslipidemia - Continue statin.     Marland Kitchen. Hypernatremia   . Hypothermia 03/20/2016  . Low serum cortisol level (HCC) 05/04/2016   Low a.m. cortisol  - Was 1.2 checked on 04/03/2016  - This likely is reflective of cortisol suppression from  steroid use - Tapered steroids off   . Meningitis, pneumococcal, recurrent   . Meningitis, streptococcal 02/16/2016  . Meningoencephalitis   . NSTEMI (non-ST elevated myocardial infarction) (HCC) 03/14/2016  . Protein-calorie malnutrition, severe (HCC)   . Respiratory failure (HCC) 03/03/2016  . Septic shock (HCC)   . Severe sepsis (HCC)   . 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 Substance abuse   . Systolic and diastolic CHF, chronic (HCC)   . Tobacco abuse 02/16/2016  . 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 Urinary retention 05/04/2016   Urinary retention - Continue Flomax       Social History  Substance Use Topics  . Smoking status: Current Every Day Smoker    Packs/day: 0.10    Types: Cigarettes  . Smokeless tobacco: Never Used  . Alcohol use No    History reviewed. No pertinent family history. No Known Allergies  OBJECTIVE: Vitals:   08/01/16 0359 08/01/16 0406 08/01/16 0734 08/01/16 0759  BP:  (!) 156/91 (!) 179/96     Pulse:  (!) 47 (!) 46 (!) 54  Resp:  14 12 18   Temp:  97.2 F (36.2 C) 97.8 F (36.6 C)   TempSrc:  Axillary Axillary   SpO2:  100% 100% 100%  Weight: 141 lb 1.5 oz (64 kg)     Height:       Body mass index is 21.45 kg/m.  Physical Exam  Constitutional:  He is much more alert.  Cardiovascular: Normal rate and regular rhythm.   No murmur heard. Pulmonary/Chest: Breath sounds normal. He has no wheezes. He has no rales.  Clear breath sounds anteriorly.  Abdominal: Soft.  His rectal tube is out. He is no longer having diarrhea.  Musculoskeletal: Normal range of motion. He exhibits no edema or tenderness.  Skin: No rash noted.    Lab Results Lab Results  Component Value Date   WBC 7.9 08/01/2016   HGB 9.1 (L) 08/01/2016   HCT 28.0 (L) 08/01/2016   MCV 87.2 08/01/2016   PLT 266 08/01/2016    Lab Results  Component Value Date   CREATININE 0.61 08/01/2016   BUN 16 08/01/2016   NA 143 08/01/2016   K 2.9 (L) 08/01/2016   CL 107 08/01/2016   CO2 26 08/01/2016    Lab Results  Component Value Date   ALT 10 (L) 07/12/2016   AST 12 (L) 07/12/2016   ALKPHOS 47 07/12/2016   BILITOT 0.3 07/12/2016     Microbiology: Recent Results (from the past 240 hour(s))  Urine culture     Status: Abnormal   Collection Time: 07/27/16 11:15 AM  Result Value Ref Range Status   Specimen Description URINE, RANDOM  Final   Special Requests NONE  Final   Culture (A)  Final    >=100,000 COLONIES/mL ENTEROCOCCUS FAECIUM VANCOMYCIN RESISTANT ENTEROCOCCUS    Report Status 07/30/2016 FINAL  Final   Organism ID, Bacteria ENTEROCOCCUS FAECIUM (A)  Final      Susceptibility   Enterococcus faecium - MIC*    AMPICILLIN >=32 RESISTANT Resistant     LEVOFLOXACIN >=8 RESISTANT Resistant     NITROFURANTOIN 128 RESISTANT Resistant     VANCOMYCIN >=32 RESISTANT Resistant     LINEZOLID 2 SENSITIVE Sensitive     * >=100,000 COLONIES/mL ENTEROCOCCUS FAECIUM  Culture, respiratory (NON-Expectorated)      Status: None   Collection Time: 07/27/16 11:15 AM  Result Value Ref Range Status   Specimen Description TRACHEAL ASPIRATE  Final   Special Requests  NONE  Final   Gram Stain   Final    MODERATE WBC PRESENT, PREDOMINANTLY PMN MODERATE GRAM POSITIVE COCCI IN PAIRS IN CLUSTERS FEW GRAM POSITIVE RODS RARE GRAM NEGATIVE RODS    Culture   Final    ABUNDANT METHICILLIN RESISTANT STAPHYLOCOCCUS AUREUS ABUNDANT ENTEROBACTER SPECIES    Report Status 07/30/2016 FINAL  Final   Organism ID, Bacteria METHICILLIN RESISTANT STAPHYLOCOCCUS AUREUS  Final   Organism ID, Bacteria ENTEROBACTER SPECIES  Final      Susceptibility   Enterobacter species - MIC*    CEFAZOLIN >=64 RESISTANT Resistant     CEFEPIME 4 SENSITIVE Sensitive     CEFTAZIDIME >=64 RESISTANT Resistant     CEFTRIAXONE >=64 RESISTANT Resistant     CIPROFLOXACIN 1 SENSITIVE Sensitive     GENTAMICIN >=16 RESISTANT Resistant     IMIPENEM 1 SENSITIVE Sensitive     TRIMETH/SULFA <=20 SENSITIVE Sensitive     PIP/TAZO >=128 RESISTANT Resistant     * ABUNDANT ENTEROBACTER SPECIES   Methicillin resistant staphylococcus aureus - MIC*    CIPROFLOXACIN >=8 RESISTANT Resistant     ERYTHROMYCIN <=0.25 SENSITIVE Sensitive     GENTAMICIN <=0.5 SENSITIVE Sensitive     OXACILLIN >=4 RESISTANT Resistant     TETRACYCLINE <=1 SENSITIVE Sensitive     VANCOMYCIN <=0.5 SENSITIVE Sensitive     TRIMETH/SULFA <=10 SENSITIVE Sensitive     CLINDAMYCIN <=0.25 SENSITIVE Sensitive     RIFAMPIN <=0.5 SENSITIVE Sensitive     Inducible Clindamycin NEGATIVE Sensitive     * ABUNDANT METHICILLIN RESISTANT STAPHYLOCOCCUS AUREUS  Culture, blood (Routine X 2) w Reflex to ID Panel     Status: Abnormal   Collection Time: 07/27/16 12:23 PM  Result Value Ref Range Status   Specimen Description BLOOD RIGHT ARM  Final   Special Requests BOTTLES DRAWN AEROBIC AND ANAEROBIC 5 CC  Final   Culture  Setup Time   Final    GRAM POSITIVE COCCI IN CLUSTERS AEROBIC BOTTLE  ONLY CRITICAL RESULT CALLED TO, READ BACK BY AND VERIFIED WITH: M. Bitonti Pharm.D. 11:30 07/28/16 ([email or web address])    Culture METHICILLIN RESISTANT STAPHYLOCOCCUS AUREUS (A)  Final   Report Status 07/30/2016 FINAL  Final   Organism ID, Bacteria METHICILLIN RESISTANT STAPHYLOCOCCUS AUREUS  Final      Susceptibility   Methicillin resistant staphylococcus aureus - MIC*    CIPROFLOXACIN >=8 RESISTANT Resistant     ERYTHROMYCIN <=0.25 SENSITIVE Sensitive     GENTAMICIN <=0.5 SENSITIVE Sensitive     OXACILLIN >=4 RESISTANT Resistant     TETRACYCLINE <=1 SENSITIVE Sensitive     VANCOMYCIN <=0.5 SENSITIVE Sensitive     TRIMETH/SULFA <=10 SENSITIVE Sensitive     CLINDAMYCIN <=0.25 SENSITIVE Sensitive     RIFAMPIN <=0.5 SENSITIVE Sensitive     Inducible Clindamycin NEGATIVE Sensitive     * METHICILLIN RESISTANT STAPHYLOCOCCUS AUREUS  Culture, blood (Routine X 2) w Reflex to ID Panel     Status: None (Preliminary result)   Collection Time: 07/27/16 12:23 PM  Result Value Ref Range Status   Specimen Description BLOOD RIGHT ARM  Final   Special Requests BOTTLES DRAWN AEROBIC AND ANAEROBIC 5 CC  Final   Culture NO GROWTH 4 DAYS  Final   Report Status PENDING  Incomplete  Blood Culture ID Panel (Reflexed)     Status: Abnormal   Collection Time: 07/27/16 12:23 PM  Result Value Ref Range Status   Enterococcus species NOT DETECTED NOT DETECTED Final   Listeria monocytogenes NOT DETECTED  NOT DETECTED Final   Staphylococcus species DETECTED (A) NOT DETECTED Final    Comment: CRITICAL RESULT CALLED TO, READ BACK BY AND VERIFIED WITH: M. Bitonti Pharm.D. 11:30 07/28/16 ([email or web address])    Staphylococcus aureus DETECTED (A) NOT DETECTED Final    Comment: CRITICAL RESULT CALLED TO, READ BACK BY AND VERIFIED WITH: M. Bitonti Pharm.D. 11:30 07/28/16  ([email or web address])    Methicillin resistance DETECTED (A) NOT DETECTED Final    Comment: CRITICAL RESULT CALLED TO, READ BACK BY AND VERIFIED WITH: M. Bitonti Pharm.D. 11:30  07/28/16 ([email or web address])    Streptococcus species NOT DETECTED NOT DETECTED Final   Streptococcus agalactiae NOT DETECTED NOT DETECTED Final   Streptococcus pneumoniae NOT DETECTED NOT DETECTED Final   Streptococcus pyogenes NOT DETECTED NOT DETECTED Final   Acinetobacter baumannii NOT DETECTED NOT DETECTED Final   Enterobacteriaceae species NOT DETECTED NOT DETECTED Final   Enterobacter cloacae complex NOT DETECTED NOT DETECTED Final   Escherichia coli NOT DETECTED NOT DETECTED Final   Klebsiella oxytoca NOT DETECTED NOT DETECTED Final   Klebsiella pneumoniae NOT DETECTED NOT DETECTED Final   Proteus species NOT DETECTED NOT DETECTED Final   Serratia marcescens NOT DETECTED NOT DETECTED Final   Haemophilus influenzae NOT DETECTED NOT DETECTED Final   Neisseria meningitidis NOT DETECTED NOT DETECTED Final   Pseudomonas aeruginosa NOT DETECTED NOT DETECTED Final   Candida albicans NOT DETECTED NOT DETECTED Final   Candida glabrata NOT DETECTED NOT DETECTED Final   Candida krusei NOT DETECTED NOT DETECTED Final   Candida parapsilosis NOT DETECTED NOT DETECTED Final   Candida tropicalis NOT DETECTED NOT DETECTED Final  Culture, blood (routine x 2)     Status: None (Preliminary result)   Collection Time: 07/28/16  6:16 PM  Result Value Ref Range Status   Specimen Description BLOOD LEFT ANTECUBITAL  Final   Special Requests BOTTLES DRAWN AEROBIC AND ANAEROBIC 10CC EA  Final   Culture NO GROWTH 3 DAYS  Final   Report Status PENDING  Incomplete  Culture, blood (routine x 2)     Status: Abnormal   Collection Time: 07/28/16  6:24 PM  Result Value Ref Range Status   Specimen Description BLOOD RIGHT ANTECUBITAL  Final   Special Requests IN PEDIATRIC BOTTLE 1.5CC  Final   Culture  Setup Time   Final    GRAM POSITIVE COCCI IN CLUSTERS AEROBIC BOTTLE ONLY CRITICAL RESULT CALLED TO, READ BACK BY AND VERIFIED WITH: A MYER 07/29/16 @ 1841 M VESTAL    Culture (A)  Final    STAPHYLOCOCCUS  AUREUS SUSCEPTIBILITIES PERFORMED ON PREVIOUS CULTURE WITHIN THE LAST 5 DAYS.    Report Status 07/31/2016 FINAL  Final     ASSESSMENT: He is now afebrile and more alert but his overall prognosis remains extremely poor. Over the weekend his ampicillin sulbactam was changed to cefepime because his sputum is now growing Enterobacter along with MRSA. He is also on IV vancomycin for transient MRSA bacteremia. His C. difficile colitis is under better control.  PLAN: 1. Continue current antibiotics 2. I will follow-up later this week.  Cliffton Asters, MD [facility] [phone] pager   [phone] cell 08/01/2016, 9:58 AM

## 2016-08-01 NOTE — Procedures (Signed)
Interventional Radiology Procedure Note  Procedure:  Percutaneous gastrostomy tube placement  Complications:  None  Estimated Blood Loss:  < 10 mL  20 Fr bumper retention gastrostomy tube placed with tip in body of stomach.  OK to use tomorrow.  Jodi MarbleGlenn T. Fredia SorrowYamagata, M.D Pager:  [phone]

## 2016-08-01 NOTE — Progress Notes (Signed)
Pharmacy Antibiotic Note  Bonnita HollowCraig D Muralles is a 50 y.o. male admitted on 07/12/2016 with AMS and inability to the protect is airway.  Patient has an extensive history of antibiotic use.  Currently on vancomycin for transient MRSA bacteremia and PNA, and cefepime for Enterobacter PNA.  His renal function is stable and his vancomycin trough is supra-therapeutic, likely due to accumulation.  Plan: - Change vanc to 1gm IV Q12H - Continue Cefepime 2gm IV Q8H - Continue PO Vanc for C.diff.  D/C 10 days after all abx are discontinued. - Monitor renal fxn, micro data, clinical progress, weekly vanc trough   Height: 5\' 8"  (172.7 cm) Weight: 141 lb 1.5 oz (64 kg) IBW/kg (Calculated) : 68.4  Temp (24hrs), Avg:97.4 F (36.3 C), Min:97 F (36.1 C), Max:97.8 F (36.6 C)   Recent Labs Lab 07/28/16 0330 07/29/16 0415 07/29/16 1949 07/30/16 0249 07/31/16 0250 08/01/16 0119 08/01/16 1245  WBC 10.8* 11.9*  --  11.3* 7.8 7.9  --   CREATININE 0.63 0.69  --  0.68 0.67 0.61  --   VANCOTROUGH  --   --  13*  --   --   --  24*    Estimated Creatinine Clearance: 98.9 mL/min (by C-G formula based on SCr of 0.8 mg/dL).    No Known Allergies  Antimicrobials this admission: Zosyn 8/9>>8/11 Vanc 8/9>>8/10 Merrem 8/11>>8/18 PO vanc 8/13>> (for 10 days past all abx stopped) Unasyn 8/23 >> 8/27 Vanc 8/24 >> Cefepime 8/27  Dose adjustments this admission: 8/25 VT = 13 mcg/mL on 500mg  q8 > inc 750 q8 8/28 VT = 24 mcg/mL on 750mg  q8 > 1g q12  Microbiology results: 8/8 Cdiff (OSH) - positive 8/8, 8/9 BCx x2 - negative 8/8 Urine - Enterobacter (S Primaxin) 8/8 Sputum - Enterobacter 8/8 MRSA PCR - neg 8/12 C.diff: antigen and toxin positive 8/23 BCx x2 - MRSA 8/23 sputum - MRSA, Enterobacter (S cefepime, Primaxin) 8/23 UCx - VRE (indwelling foley from snf - will change foley, not tx needed) 8/24 BCx x2 - MSSA 8/27 UCx -    Loree Shehata D. Laney Potashang, PharmD, BCPS Pager:  [phone] 08/01/2016, 2:21 PM

## 2016-08-01 NOTE — Progress Notes (Signed)
Pt transferred to 2c- CSW was given handoff by previous CSW- vent SNF referral has been initiated- no beds at this time  Patient is currently weaning according to RN staff- if pt able to remain stable off vent at 28% for 48 hours we can pursue trach SNF  CSW will continue to follow  Burna SisJenna H. Camillia Marcy, LCSWA Clinical Social Worker [phone]

## 2016-08-01 NOTE — Progress Notes (Signed)
Palliative Medicine RN Note: Discussed [place] law regarding surrogate decision makers with PMT. After spouse refuses/isn't available, it falls to adult children + living parents, then siblings, then anyone acting in good faith, then attending MD.   Left message for wife requesting call back. We will need for her to tell us she is deferring decisions, then we will need to speak to one of his children to confirm that they are also deferring. Patient does have involved siblings, but we must rule out spouse and children as surrogates first.  Margret ChanceMelanie G. Eitan Doubleday, RN, BSN, [facility] 08/01/2016 10:40 AM Cell [phone] 8:00-4:00 Monday-Friday Office [phone]

## 2016-08-02 ENCOUNTER — Inpatient Hospital Stay (HOSPITAL_COMMUNITY): Payer: Medicaid Other

## 2016-08-02 DIAGNOSIS — J189 Pneumonia, unspecified organism: Secondary | ICD-10-CM | POA: Diagnosis present

## 2016-08-02 DIAGNOSIS — I639 Cerebral infarction, unspecified: Secondary | ICD-10-CM

## 2016-08-02 DIAGNOSIS — F1010 Alcohol abuse, uncomplicated: Secondary | ICD-10-CM

## 2016-08-02 DIAGNOSIS — I469 Cardiac arrest, cause unspecified: Secondary | ICD-10-CM

## 2016-08-02 LAB — BLOOD GAS, ARTERIAL
ACID-BASE EXCESS: 7.5 mmol/L — AB (ref 0.0–2.0)
BICARBONATE: 31.3 mmol/L — AB (ref 20.0–28.0)
DRAWN BY: 312971
FIO2: 0.35
O2 SAT: 98.2 %
PH ART: 7.488 — AB (ref 7.350–7.450)
Patient temperature: 97.4
pCO2 arterial: 41.4 mmHg (ref 32.0–48.0)
pO2, Arterial: 109 mmHg — ABNORMAL HIGH (ref 83.0–108.0)

## 2016-08-02 LAB — COMPREHENSIVE METABOLIC PANEL
ALT: 13 U/L — ABNORMAL LOW (ref 17–63)
AST: 17 U/L (ref 15–41)
Albumin: 2 g/dL — ABNORMAL LOW (ref 3.5–5.0)
Alkaline Phosphatase: 47 U/L (ref 38–126)
Anion gap: 10 (ref 5–15)
BUN: 17 mg/dL (ref 6–20)
CO2: 29 mmol/L (ref 22–32)
Calcium: 8.5 mg/dL — ABNORMAL LOW (ref 8.9–10.3)
Chloride: 103 mmol/L (ref 101–111)
Creatinine, Ser: 0.58 mg/dL — ABNORMAL LOW (ref 0.61–1.24)
GFR calc Af Amer: >60 mL/min (ref >60)
GFR calc non Af Amer: >60 mL/min (ref >60)
Glucose, Bld: 112 mg/dL — ABNORMAL HIGH (ref 65–99)
Potassium: 3.6 mmol/L (ref 3.5–5.1)
Sodium: 142 mmol/L (ref 135–145)
Total Bilirubin: 0.3 mg/dL (ref 0.3–1.2)
Total Protein: 5.2 g/dL — ABNORMAL LOW (ref 6.5–8.1)

## 2016-08-02 LAB — CULTURE, BLOOD (ROUTINE X 2): Culture: NO GROWTH

## 2016-08-02 LAB — GLUCOSE, CAPILLARY
GLUCOSE-CAPILLARY: 113 mg/dL — AB (ref 65–99)
GLUCOSE-CAPILLARY: 93 mg/dL (ref 65–99)
GLUCOSE-CAPILLARY: 88 mg/dL (ref 65–99)
Glucose-Capillary: 77 mg/dL (ref 65–99)
Glucose-Capillary: 73 mg/dL (ref 65–99)
Glucose-Capillary: 123 mg/dL — ABNORMAL HIGH (ref 65–99)

## 2016-08-02 LAB — CBC
HCT: 31 % — ABNORMAL LOW (ref 39.0–52.0)
HEMOGLOBIN: 10.3 g/dL — AB (ref 13.0–17.0)
MCH: 29.3 pg (ref 26.0–34.0)
MCHC: 33.2 g/dL (ref 30.0–36.0)
MCV: 88.3 fL (ref 78.0–100.0)
PLATELETS: 237 10*3/uL (ref 150–400)
RBC: 3.51 MIL/uL — AB (ref 4.22–5.81)
RDW: 17.4 % — ABNORMAL HIGH (ref 11.5–15.5)
WBC: 6.3 10*3/uL (ref 4.0–10.5)

## 2016-08-02 LAB — VALPROIC ACID LEVEL: Valproic Acid Lvl: 64 ug/mL (ref 50.0–100.0)

## 2016-08-02 LAB — MAGNESIUM: Magnesium: 2.3 mg/dL (ref 1.7–2.4)

## 2016-08-02 MED ORDER — LORAZEPAM 2 MG/ML IJ SOLN
INTRAMUSCULAR | Status: AC
  Administered 2016-08-02: 2 mg via INTRAVENOUS
  Filled 2016-08-02: qty 1

## 2016-08-02 MED ORDER — LORAZEPAM 2 MG/ML IJ SOLN
2.0000 mg | Freq: Once | INTRAMUSCULAR | Status: AC
  Administered 2016-08-02: 2 mg via INTRAVENOUS

## 2016-08-02 NOTE — Progress Notes (Signed)
Referring Physician(s): Dr Dia Crawford  Supervising Physician: Sandi Mariscal  Patient Status:  Inpatient  Chief Complaint:  dysphagia Need for long term care  Subjective:  Interventional Radiology Procedure Note Procedure:  Percutaneous gastrostomy tube placement Complications:  None Estimated Blood Loss:  < 10 mL 20 Fr bumper retention gastrostomy tube placed with tip in body of stomach.  OK to use tomorrow.  Allergies: Review of patient's allergies indicates no known allergies.  Medications: Prior to Admission medications   Medication Sig Start Date End Date Taking? Authorizing Provider  albuterol (PROVENTIL) (2.5 MG/3ML) 0.083% nebulizer solution Take 3 mLs (2.5 mg total) by nebulization every 3 (three) hours as needed for wheezing. Patient taking differently: Take 2.5 mg by nebulization every 3 (three) hours as needed for wheezing or shortness of breath.  03/10/16  Yes Reyne Dumas, MD  aspirin 325 MG tablet Take 1 tablet (325 mg total) by mouth daily. 06/01/16  Yes Janece Canterbury, MD  atorvastatin (LIPITOR) 20 MG tablet Take 1 tablet (20 mg total) by mouth daily at 6 PM. 04/04/16  Yes Robbie Lis, MD  bethanechol (URECHOLINE) 10 MG tablet Take 1 tablet (10 mg total) by mouth every 8 (eight) hours. 06/29/16  Yes Reyne Dumas, MD  divalproex (DEPAKOTE SPRINKLE) 125 MG capsule Take 6 capsules (750 mg total) by mouth every 8 (eight) hours. 07/11/16  Yes Reyne Dumas, MD  famotidine (PEPCID) 20 MG tablet Take 1 tablet (20 mg total) by mouth daily. 06/01/16  Yes Janece Canterbury, MD  feeding supplement, ENSURE ENLIVE, (ENSURE ENLIVE) LIQD Take 237 mLs by mouth every 6 (six) hours. 05/31/16  Yes Hosie Poisson, MD  fludrocortisone (FLORINEF) 0.1 MG tablet Take 1 tablet (0.1 mg total) by mouth daily. 07/11/16  Yes Reyne Dumas, MD  folic acid (FOLVITE) 1 MG tablet Take 1 tablet (1 mg total) by mouth daily. 06/01/16  Yes Janece Canterbury, MD  hydrocortisone (CORTEF) 20 MG tablet Take 5 tablets  (100 mg total) by mouth 3 (three) times daily with meals. 07/11/16  Yes Reyne Dumas, MD  levETIRAcetam (KEPPRA) 100 MG/ML solution Take 15 mLs (1,500 mg total) by mouth 2 (two) times daily. 06/29/16  Yes Reyne Dumas, MD  LORazepam (ATIVAN) 0.5 MG tablet Take 1 tablet (0.5 mg total) by mouth every 8 (eight) hours as needed for anxiety. 06/29/16  Yes Reyne Dumas, MD  potassium chloride SA (K-DUR,KLOR-CON) 20 MEQ tablet Take 2 tablets (40 mEq total) by mouth daily. 07/11/16  Yes Reyne Dumas, MD  tamsulosin (FLOMAX) 0.4 MG CAPS capsule Take 1 capsule (0.4 mg total) by mouth daily. 04/04/16  Yes Robbie Lis, MD  thiamine 100 MG tablet Take 1 tablet (100 mg total) by mouth daily. 06/01/16  Yes Janece Canterbury, MD  Maltodextrin-Xanthan Gum ([place]) POWD As needed. 05/31/16   Hosie Poisson, MD  Maltodextrin-Xanthan Gum (RESOURCE THICKENUP CLEAR) POWD Take 120 g by mouth as needed. 06/29/16   Reyne Dumas, MD     Vital Signs: BP (!) 139/97   Pulse (!) 56   Temp (!) 93.6 F (34.2 C) (Axillary)   Resp 14   Ht _0  (1.727 m)   Wt 140 lb 6.9 oz (63.7 kg)   SpO2 100%   BMI 21.35 kg/m   Physical Exam  Abdominal: Soft. Bowel sounds are normal. He exhibits no distension.  Skin: Skin is warm and dry.  Clean and dry No bleeding No hematoma + BS afeb    Imaging: Ir Gastrostomy Tube Mod  Sed  Result Date: 08/01/2016 CLINICAL DATA:  Respiratory failure and aspiration pneumonia. The patient requires percutaneous gastrostomy for long-term nutritional needs. EXAM: PERCUTANEOUS GASTROSTOMY TUBE PLACEMENT ANESTHESIA/SEDATION: 1.0 mg IV Versed; 50 mcg IV Fentanyl. Total Moderate Sedation Time 6 minutes. The patient's level of consciousness and physiologic status were continuously monitored during the procedure by Radiology nursing. CONTRAST:  20 mL Isovue-300 injected into the stomach. MEDICATIONS: A scheduled dose of IV cefepime was administered approximately 1 hour prior to the procedure.  FLUOROSCOPY TIME:  1 minute and 42 seconds. PROCEDURE: The procedure, risks, benefits, and alternatives were explained to the patient's wife. Questions regarding the procedure were encouraged and answered. The patient's wife understands and consents to the procedure. A time-out was performed prior to the procedure. A 5-French catheter was then advanced through the the patient's mouth under fluoroscopy into the esophagus and to the level of the stomach. This catheter was used to insufflate the stomach with air under fluoroscopy. The abdominal wall was prepped with Betadine in a sterile fashion, and a sterile drape was applied covering the operative field. A sterile gown and sterile gloves were used for the procedure. Local anesthesia was provided with 1% Lidocaine. A skin incision was made in the upper abdominal wall. Under fluoroscopy, an 18 gauge trocar needle was advanced into the stomach. Contrast injection was performed to confirm intraluminal position of the needle tip. A single T tack was then deployed in the lumen of the stomach. This was brought up to tension at the skin surface. Over a guidewire, a 9-French sheath was advanced into the lumen of the stomach. The wire was left in place as a safety wire. A loop snare device from a percutaneous gastrostomy kit was then advanced into the stomach. A floppy guide wire was advanced through the orogastric catheter under fluoroscopy in the stomach. The loop snare advanced through the percutaneous gastric access was used to snare the guide wire. This allowed withdrawal of the loop snare out of the patient's mouth by retraction of the orogastric catheter and wire. A 20-French bumper retention gastrostomy tube was looped around the snare device. It was then pulled back through the patient's mouth. The retention bumper was brought up to the anterior gastric wall. The T tack suture was cut at the skin. The exiting gastrostomy tube was cut to appropriate length and a  feeding adapter applied. The catheter was injected with contrast material to confirm position and a fluoroscopic spot image saved. The tube was then flushed with saline. A dressing was applied over the gastrostomy exit site. COMPLICATIONS: None. FINDINGS: The stomach distended well with air allowing safe placement of the gastrostomy tube. After placement, the tip of the gastrostomy tube lies in the body of the stomach. IMPRESSION: Percutaneous gastrostomy with placement of a 20-French bumper retention tube in the body of the stomach. This tube can be used for percutaneous feeds beginning in 24 hours after placement. Electronically Signed   By: Aletta Edouard M.D.   On: 08/01/2016 16:38    Labs:  CBC:  Recent Labs  07/30/16 0249 07/31/16 0250 08/01/16 0119 08/02/16 0303  WBC 11.3* 7.8 7.9 6.3  HGB 7.8* 9.1* 9.1* 10.3*  HCT 24.5* 28.5* 28.0* 31.0*  PLT 303 309 266 237    COAGS:  Recent Labs  02/25/16 1403 05/19/16 2115 06/17/16 0459 07/15/16 1300 07/22/16 0430 07/29/16 0415  INR 1.03 1.27 1.20 1.00 0.96 1.18  APTT 38* 53*  --  36 34  --  BMP:  Recent Labs  07/30/16 0249 07/31/16 0250 07/31/16 1337 08/01/16 0119 08/02/16 0303  NA 145 146*  --  143 142  K 3.4* 2.8* 4.0 2.9* 3.6  CL 112* 110  --  107 103  CO2 22 25  --  26 29  GLUCOSE 112* 96  --  119* 112*  BUN 16 18  --  16 17  CALCIUM 8.7* 8.6*  --  8.6* 8.5*  CREATININE 0.68 0.67  --  0.61 0.58*  GFRNONAA >60 >60  --  >60 >60  GFRAA >60 >60  --  >60 >60    LIVER FUNCTION TESTS:  Recent Labs  07/09/16 0451 07/11/16 0400 07/12/16 1345 08/02/16 0303  BILITOT 0.4 0.3 0.3 0.3  AST 14* 10* 12* 17  ALT 11* 11* 10* 13*  ALKPHOS 48 45 47 47  PROT 5.1* 4.9* 5.3* 5.2*  ALBUMIN 2.3* 2.3* 2.4* 2.0*    Assessment and Plan:  May [place] G tube now  Electronically Signed: Shakaya Bhullar A 08/02/2016, 8:27 AM   I spent a total of 15 Minutes at the the patient's bedside AND on the patient's hospital floor or  unit, greater than 50% of which was counseling/coordinating care for Perc Gastric tube placement

## 2016-08-02 NOTE — Progress Notes (Signed)
Briefly met with Dale [name]. He was unresponsive, did not open eyes, or follow commands. Trach and peg in place. Patient has been off the ventilator since yesterday morning per RN. If patient is stable off of vent for 48 hours, he will go to SNF. PMT RN, Threasa Beards, left voicemail to wife yesterday and she has not responded. Wife has been refusing to meet with team to discuss long term goals. Will continue to follow patient during hospitalization and more then happy to meet with family if they wish. Please let us know if we can be of further assistance with Dale [name].   NO CHARGE  Ihor Dow, FNP-C Palliative Medicine Team  Phone: [phone] Fax: [phone]

## 2016-08-02 NOTE — Progress Notes (Addendum)
PULMONARY / CRITICAL CARE MEDICINE   Name: Dale [name] MRN: [id number] DOB: Aug 23, 1966    ADMISSION DATE:  07/12/2016 CONSULTATION DATE:  07/12/16  REFERRING MD:  [facility]  CHIEF COMPLAINT:  AMS  50 yo male from [facility] with frequent admissions admitted again 8/18 with altered mental status, inability to protect airway.  He was hypotensive and hypothermic from UTI. Acute resp failure and severe delirium. S/p Trach on 8/18.   He has PMHx of combined CHF, Streptococcal meningitis, ETOH, Depression, Seizures, Dementia, CVA.  SUBJECTIVE:  Reportedly off vent since AM 8/28. Somnolent this AM.  Has not received sedating meds.  Had PEG placed yesterday 8/28.   VITAL SIGNS: BP (!) 139/97   Pulse (!) 56   Temp (!) 93.6 F (34.2 C) (Axillary)   Resp 14   Ht _0  (1.727 m)   Wt 140 lb 6.9 oz (63.7 kg)   SpO2 100%   BMI 21.35 kg/m   HEMODYNAMICS:    VENTILATOR SETTINGS: FiO2 (%):  [28 %-40 %] 35 %  INTAKE / OUTPUT: I/O last 3 completed shifts: In: 6599 [I.V.:240; NG/GT:488; IV Piggyback:700] Out: 5500 [Urine:5500]  PHYSICAL EXAMINATION: General: ill appearing, trached on ATC Neuro: RASS 0 to -1, somnolent, did not open eyes to pain strnal rub HEENT: mm moist, no JVD, trach in place, clean Cardiac: s1 s2 RRR Chest: reduced Abd: soft, non tender. Ext: 1+ edema UE, 2+ LLE. Skin: sacral pressure ulcer.  LABS:  BMET  Recent Labs Lab 07/31/16 0250 07/31/16 1337 08/01/16 0119 08/02/16 0303  NA 146*  --  143 142  K 2.8* 4.0 2.9* 3.6  CL 110  --  107 103  CO2 25  --  26 29  BUN 18  --  16 17  CREATININE 0.67  --  0.61 0.58*  GLUCOSE 96  --  119* 112*   Electrolytes  Recent Labs Lab 07/28/16 0330  07/31/16 0250 07/31/16 1337 08/01/16 0119 08/02/16 0303  CALCIUM 8.3*  < > 8.6*  --  8.6* 8.5*  MG 1.7  --   --  1.9 1.7 2.3  PHOS 4.4  --   --   --   --   --   < > = values in this interval not displayed.  CBC  Recent Labs Lab 07/31/16 0250 08/01/16 0119  08/02/16 0303  WBC 7.8 7.9 6.3  HGB 9.1* 9.1* 10.3*  HCT 28.5* 28.0* 31.0*  PLT 309 266 237   Coag's  Recent Labs Lab 07/29/16 0415  INR 1.18   Sepsis Markers No results for input(s): LATICACIDVEN, PROCALCITON, O2SATVEN in the last 168 hours.  ABG  Recent Labs Lab 07/28/16 0455  PHART 7.437  PCO2ART 38.1  PO2ART 133*    Liver Enzymes  Recent Labs Lab 08/02/16 0303  AST 17  ALT 13*  ALKPHOS 47  BILITOT 0.3  ALBUMIN 2.0*    Cardiac Enzymes No results for input(s): TROPONINI, PROBNP in the last 168 hours.  Glucose  Recent Labs Lab 08/01/16 1137 08/01/16 1616 08/01/16 1916 08/01/16 2303 08/02/16 0419 08/02/16 0744  GLUCAP 75 67 83 125* 113* 93   Imaging Ir Gastrostomy Tube Mod Sed  Result Date: 08/01/2016 CLINICAL DATA:  Respiratory failure and aspiration pneumonia. The patient requires percutaneous gastrostomy for long-term nutritional needs. EXAM: PERCUTANEOUS GASTROSTOMY TUBE PLACEMENT ANESTHESIA/SEDATION: 1.0 mg IV Versed; 50 mcg IV Fentanyl. Total Moderate Sedation Time 6 minutes. The patient's level of consciousness and physiologic status were continuously monitored during the procedure by Radiology  nursing. CONTRAST:  20 mL Isovue-300 injected into the stomach. MEDICATIONS: A scheduled dose of IV cefepime was administered approximately 1 hour prior to the procedure. FLUOROSCOPY TIME:  1 minute and 42 seconds. PROCEDURE: The procedure, risks, benefits, and alternatives were explained to the patient's wife. Questions regarding the procedure were encouraged and answered. The patient's wife understands and consents to the procedure. A time-out was performed prior to the procedure. A 5-French catheter was then advanced through the the patient's mouth under fluoroscopy into the esophagus and to the level of the stomach. This catheter was used to insufflate the stomach with air under fluoroscopy. The abdominal wall was prepped with Betadine in a sterile fashion,  and a sterile drape was applied covering the operative field. A sterile gown and sterile gloves were used for the procedure. Local anesthesia was provided with 1% Lidocaine. A skin incision was made in the upper abdominal wall. Under fluoroscopy, an 18 gauge trocar needle was advanced into the stomach. Contrast injection was performed to confirm intraluminal position of the needle tip. A single T tack was then deployed in the lumen of the stomach. This was brought up to tension at the skin surface. Over a guidewire, a 9-French sheath was advanced into the lumen of the stomach. The wire was left in place as a safety wire. A loop snare device from a percutaneous gastrostomy kit was then advanced into the stomach. A floppy guide wire was advanced through the orogastric catheter under fluoroscopy in the stomach. The loop snare advanced through the percutaneous gastric access was used to snare the guide wire. This allowed withdrawal of the loop snare out of the patient's mouth by retraction of the orogastric catheter and wire. A 20-French bumper retention gastrostomy tube was looped around the snare device. It was then pulled back through the patient's mouth. The retention bumper was brought up to the anterior gastric wall. The T tack suture was cut at the skin. The exiting gastrostomy tube was cut to appropriate length and a feeding adapter applied. The catheter was injected with contrast material to confirm position and a fluoroscopic spot image saved. The tube was then flushed with saline. A dressing was applied over the gastrostomy exit site. COMPLICATIONS: None. FINDINGS: The stomach distended well with air allowing safe placement of the gastrostomy tube. After placement, the tip of the gastrostomy tube lies in the body of the stomach. IMPRESSION: Percutaneous gastrostomy with placement of a 20-French bumper retention tube in the body of the stomach. This tube can be used for percutaneous feeds beginning in 24 hours  after placement. Electronically Signed   By: Aletta Edouard M.D.   On: 08/01/2016 16:38   STUDIES:  CT head 8/8 >> old b/l infarcts with subacute infarct again identified at inferior left caudate.  No acute process. EEG 8/10 >>suppressed backround MRI brain 8/11 >> multifocal acute Lt MCA ischemic infarcts, possible infarct Rt caudate head  CULTURES: 8/08 Urine >> Enterobacter 8/08 Blood >> NEG 8/09 Sputum >> MDR Enterobacter 8/09 Blood >> NEG 8/12 C diff >> antigen and toxin positive 8/23 Blood>>> 1/2 Biofire= MRSa vs MRSE>>> 8/23 Urine>>> 8/23 Sputum>>> GNR >>  ANTIBIOTICS: Vanc 8/8 >>8/10 Zosyn 8/8 >>8/11 meropenem 8/11>> 8/18 Oral vancomycin 8/12 >> Unasyn 8/23>>> vanc IV 8/24>>>  SIGNIFICANT EVENTS: 8/08 admitted with unresponsiveness, required intubation. 8/09 low cvp, levophed, possible seizure  8/10 not moving rt ext upper and lowers 8/13 Following commands 8/15>> Following Commands, but agitated., Norepi off 8/15 am. 8/23 asp concerns  LINES/TUBES: ETT 8/8 > 8/18 Trach (JY) 8/18 >>  R IJ CVL 8/8 >>>8/25   ASSESSMENT / PLAN:  PULMONARY A: Acute hypoxic respiratory failure - due to inability to protect airway. 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 Aspiration PNA 8/23 event P:   Assess ABG given somnolence. NO PMV with secretions. pcxr intermittent.  CARDIOVASCULAR A:  Septic shock from UTI, HCAP, C diff colitis - resolved.  Chronic combined CHF, HLD. brady P:  MAP goal >55. Continue ASA, lipitor. Continue florinef, hydrocort.  RENAL A:   No acute issues. P:   KVO.  GASTROINTESTINAL A:   Protein calorie malnutrition. S/p PEG 8/28. P:   Continue TF's. Pepcid for SUP.  HEMATOLOGIC A:   Anemia of chronic disease and critical illness. P:  F/u CBC in am. SQ heparin for DVT prophylaxis.  INFECTIOUS A:   MDR gram neg UTI - VRE faecium vs colonization. HCAP with Enterobacter >> MDR. C diff colitis. New aspiration PNA  8/23. GPC clusters in blood- contam 8/23 likely 1/2. P:   Continue cefepime, vanc.  ENDOCRINE A:   Chronic adrenal insufficiency. P:    Continue florinef, hydrocort. Continue SSI.  NEUROLOGIC A:   Acute encephalopathy. Hx seizures, strep pneumo meningitis, CVA. Agitated requiring increased sedation > now has developed oversedation 8/20.  Also somnolent 8/29. P:   Assess ABG. Continue depakote, keppra  Decreased Seroquel to 100 mg BID 8/20.  Ccm time 30 min.   Montey Hora, [facility] Pager: [phone]  or [phone] 08/02/2016, 11:37 AM     STAFF NOTE: I, Merrie Roof, MD FACP have personally reviewed patient's available data, including medical history, events of note, physical examination and test results as part of my evaluation. I have discussed with resident/NP and other care providers such as pharmacist, RN and RRT. In addition, I personally evaluated patient and elicited key findings of: Mental status is worse, STAT ABG did not show co2 narcosis, he is ventilating well on TC, he did receive sedaiton for peg that should have warm off, would hold any sedating agents, with left mca showering of infarcts on admission, would CT head now for cause of worsening MS, can maintain trach collar for now but assess closely in SDU, his circumstances are concerning and sad, however what we are providing is medically futile and ineffective, it would be futile to off vent support in future and this should be re discussed with pall care or attending with family, get depakoe level, may need eeg, keep in [place]. Titus Mould, MD, [place] Pgr: [facility] 08/02/2016 3:52 PM

## 2016-08-02 NOTE — Progress Notes (Signed)
PROGRESS NOTE    Dale [name]  GBT:[id number] DOB: 02-Dec-1966 DOA: 07/12/2016 PCP: No primary care provider on file.   Brief Narrative:  50 year old BM presented from [facility] PMHx of multiple admissions since April 2017. 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  He was hypotensive and hypothermic from UTI. Acute resp failure and severe delirium. S/p Trach on 8/18.    Subjective: 8/29 unresponsive to painful stimuli.   Assessment & Plan:   Active Problems:   Homeless   History of bacterial meningitis   Tobacco abuse   ETOH abuse   Substance abuse   Cardiogenic shock (HCC)   Cardiac arrest ([place])   Acute on chronic respiratory failure with hypoxia (HCC)   Tracheostomy status (HCC)   NSTEMI (non-ST elevated myocardial infarction) (HCC)   Chronic combined systolic and diastolic CHF (congestive heart failure) (HCC)   Cerebral thrombosis with cerebral infarction   Moderate protein-calorie malnutrition (HCC)   HLD (hyperlipidemia)   Dysphagia   Seizures (HCC)   Chronic adrenal insufficiency (HCC)   Encephalopathy   Arterial hypotension   New onset a-fib (HCC)   MRSA bacteremia   C. difficile colitis   Aspiration pneumonia ([place])   Syphilis   Pressure ulcer   Acute/subacute stroke/ Acute encephalopathy. -8/29 MRI pending; possible extension of previous stroke see results below -Allow permissive HTN  Agitation -Haldol XTG:GYIR for HR<50 or if QT becomes prolonged  Altered mental  status -Patient unresponsive this A.m. pupils sluggish responsive to light, unresponsive to painful stimuli MRI new stroke?  Acute on chronic hypoxic respiratory failure  - due to inability to protect airway. -Hx tracheostomy March 2017 ( s/p decannulation.) -Aspiration PNA 8/23 event -On trach collar  Seizures, strep pneumo meningitis, CVA. Continue depakote, keppra  Decreased Seroquel to 100 mg BID 8/20  Septic shock  -Multifactorial from UTI, HCAP, C diff colitis  IMDR gram neg UTI HCAP with Enterobacter >> MDR. C diff colitis. New aspiration PNA 8/23 GPC clusters in blood- contam 8/23 likely 1/2 Pan culture. Unasyn per pharmacy for likely aspiration PNA,  IV vanc  - resolved.   Chronic combined systolic and diastolic CHF, HLD. -Patient was on Neo-Synephrine overnight to maintain goal MAP> 55. Now off.  -Continue florinef. -Hydrocort keep at 50 q6h until BP stablizes -Strict in and out's admission + 4.5 L -Daily weight. Filed Weights   07/31/16 0430 08/01/16 0359 08/02/16 0309  Weight: 58.6 kg (129 lb 3 oz) 64 kg (141 lb 1.5 oz) 63.7 kg (140 lb 6.9 oz)  -Transfuse for hemoglobin<8 -8/26 Transfuse 1 unit PRBC  Hypernatremia  -resolved  Hypokalemia -Potassium goal> 4  Protein calorie malnutrition.  Anemia of chronic disease and critical illness.  Chronic adrenal insufficiency.        Goals of care -8/26 PALLIATIVE CARE consult placed:Patient has been hospitalized almost consistently since April 2017 with short periods of time in between hospitalizations at [facility]. Has continued to decline. Address and obtain answer on CODE STATUS, palliative care vs hospice vs no return to hospital     DVT prophylaxis: Subcutaneous heparin Code Status: Full Family Communication:  Disposition Plan: Per palliative care   Consultants:  Palliative care    Procedures/Significant Events:  CT head 8/8 >> old b/l infarcts with subacute infarct again identified at  inferior left caudate.  No acute process. 8/08 admitted with unresponsiveness, required intubation. 8/09 low cvp, levophed, possible seizure  8/10 not moving rt ext upper and lowers EEG 8/10 >>suppressed backround MRI brain 8/11 >> multifocal acute Lt MCA ischemic infarcts, possible infarct Rt caudate head MRI brain 8/29:-Moderate-sized confluent left MCA territory infarct, increased in size relative to prior MRI from 07/15/2016. - largely subacute in appearance.    Cultures 8/08 Urine >> Enterobacter 8/08 Blood >> NEG 8/09 Sputum >> MDR Enterobacter 8/09 Blood >> NEG 8/12 C diff >> antigen and toxin positive 8/23 Blood>>> 1/2 Biofire= MRSa vs MRSE>>> 8/23 Urine>>> 8/23 Sputum>>> GNR >>    Antimicrobials: Vanc 8/8 >>8/10 Zosyn 8/8 >>8/11 meropenem 8/11>> 8/18 Oral vancomycin 8/12 >> Unasyn 8/23>>> 8/27 vanc IV 8/24>>>  Cefepime 8/27>>   Devices    LINES / TUBES:  ETT 8/8 > 8/18 Trach ([place]) 8/18 >>  R IJ CVL 8/8 >>>8/25    Continuous Infusions: . sodium chloride 10 mL/hr at 07/31/16 0800  . feeding supplement (VITAL AF 1.2 CAL) 1,000 mL (08/01/16 1716)     Objective: Vitals:   08/01/16 2333 08/02/16 0309 08/02/16 0338 08/02/16 0736  BP:  (!) 143/83  (!) 139/97  Pulse: (!) 51 (!) 43 (!) 45 (!) 56  Resp: 12 (!) _0 Temp:  (!) 93.3 F (34.1 C)  (!) 93.6 F (34.2 C)  TempSrc:  Axillary  Axillary  SpO2: 100% 100% 100% 100%  Weight:  63.7 kg (140 lb 6.9 oz)    Height:        Intake/Output Summary (Last 24 hours) at 08/02/16 0755 Last data filed at 08/02/16 0540  Gross per 24 hour  Intake              684 ml  Output             2650 ml  Net            -1966 ml   Filed Weights   07/31/16 0430 08/01/16 0359 08/02/16 0309  Weight: 58.6 kg (129 lb 3 oz) 64 kg (141 lb 1.5 oz) 63.7 kg (140 lb 6.9 oz)    Examination:  General: Unresponsive to painful stimuli, Eyes: pupils dilated sluggishly responsive to light ENT: Negative Runny nose, negative  gingival bleeding, Neck:  Negative scars, masses, torticollis, lymphadenopathy, JVD, right IJ in place negative sign of infection Lungs: Clear to auscultation bilaterally without wheezes or crackles Cardiovascular: Regular rate and rhythm without murmur gallop or rub normal S1 and S2 Abdomen: negative abdominal pain, nondistended, positive soft, bowel sounds, no rebound, no ascites, no appreciable mass Extremities: No significant cyanosis, clubbing, or edema bilateral lower extremities Skin: Negative rashes, lesions, ulcers Psychiatric:  Negative depression, negative anxiety, negative fatigue, negative mania  Central nervous system: Unresponsive .     Data Reviewed: Care during the described time interval was provided by me .  I have reviewed this patient's available data, including medical history, events of note, physical examination, and all test results as part of my evaluation. I have personally reviewed and interpreted all radiology studies.  CBC:  Recent Labs Lab 07/29/16 0415 07/30/16 0249 07/31/16 0250 08/01/16 0119 08/02/16 0303  WBC 11.9* 11.3* 7.8 7.9 6.3  HGB 7.3* 7.8* 9.1* 9.1* 10.3*  HCT 22.6* 24.5* 28.5* 28.0* 31.0*  MCV 90.8 90.7 89.1 87.2 88.3  PLT 291 303 309 266 935   Basic Metabolic Panel:  Recent Labs Lab 07/28/16 0330 07/29/16 0415 07/30/16 0249 07/31/16 0250 07/31/16 1337 08/01/16 0119 08/02/16 0303  NA 137 144 145 146*  --  143 142  K 3.4* 3.6 3.4* 2.8* 4.0 2.9* 3.6  CL 104 109 112* 110  --  107 103  CO2 _0 --  26 29  GLUCOSE 91 93 112* 96  --  119* 112*  BUN _1 --  16 17  CREATININE 0.63 0.69 0.68 0.67  --  0.61 0.58*  CALCIUM 8.3* 8.7* 8.7* 8.6*  --  8.6* 8.5*  MG 1.7  --   --   --  1.9 1.7 2.3  PHOS 4.4  --   --   --   --   --   --    GFR: Estimated Creatinine Clearance: 98.4 mL/min (by C-G formula based on SCr of 0.8 mg/dL). Liver Function Tests:  Recent Labs Lab 08/02/16 0303  AST 17  ALT 13*  ALKPHOS 47    BILITOT 0.3  PROT 5.2*  ALBUMIN 2.0*   No results for input(s): LIPASE, AMYLASE in the last 168 hours. No results for input(s): AMMONIA in the last 168 hours. Coagulation Profile:  Recent Labs Lab 07/29/16 0415  INR 1.18   Cardiac Enzymes: No results for input(s): CKTOTAL, CKMB, CKMBINDEX, TROPONINI in the last 168 hours. BNP (last 3 results) No results for input(s): PROBNP in the last 8760 hours. HbA1C: No results for input(s): HGBA1C in the last 72 hours. CBG:  Recent Labs Lab 08/01/16 1616 08/01/16 1916 08/01/16 2303 08/02/16 0419 08/02/16 0744  GLUCAP 67 83 125* 113* 93   Lipid Profile: No results for input(s): CHOL, HDL, LDLCALC, TRIG, CHOLHDL, LDLDIRECT in the last 72 hours. Thyroid Function Tests: No results for input(s): TSH, T4TOTAL, FREET4, T3FREE, THYROIDAB in the last 72 hours. Anemia Panel: No results for input(s): VITAMINB12, FOLATE, FERRITIN, TIBC, IRON, RETICCTPCT in the last 72 hours. Urine analysis:    Component Value Date/Time   COLORURINE YELLOW 07/27/2016 1115   APPEARANCEUR CLOUDY (A) 07/27/2016 1115   LABSPEC 1.016 07/27/2016 1115   PHURINE 6.0 07/27/2016 1115   GLUCOSEU NEGATIVE 07/27/2016 1115   HGBUR NEGATIVE 07/27/2016 1115   BILIRUBINUR NEGATIVE 07/27/2016 1115   KETONESUR NEGATIVE 07/27/2016 1115   PROTEINUR NEGATIVE 07/27/2016 1115   UROBILINOGEN 1.0 11/13/2011 2010   NITRITE NEGATIVE 07/27/2016 1115   LEUKOCYTESUR TRACE (A) 07/27/2016 1115   Sepsis Labs: _2 (procalcitonin:4,lacticidven:4)  ) Recent Results (from the past 240 hour(s))  Urine culture     Status: Abnormal   Collection Time: 07/27/16 11:15 AM  Result Value Ref Range Status   Specimen Description URINE, RANDOM  Final   Special Requests NONE  Final   Culture (A)  Final    >=100,000 COLONIES/mL ENTEROCOCCUS FAECIUM VANCOMYCIN RESISTANT ENTEROCOCCUS    Report Status 07/30/2016 FINAL  Final   Organism ID, Bacteria ENTEROCOCCUS FAECIUM (A)  Final       Susceptibility   Enterococcus faecium - MIC*    AMPICILLIN >=32 RESISTANT Resistant     LEVOFLOXACIN >=8 RESISTANT Resistant     NITROFURANTOIN 128 RESISTANT Resistant     VANCOMYCIN >=32 RESISTANT Resistant     LINEZOLID 2 SENSITIVE Sensitive     * >=100,000 COLONIES/mL ENTEROCOCCUS FAECIUM  Culture, respiratory (NON-Expectorated)     Status: None   Collection Time: 07/27/16 11:15 AM  Result Value Ref Range Status  Specimen Description TRACHEAL ASPIRATE  Final   Special Requests NONE  Final   Gram Stain   Final    MODERATE WBC PRESENT, PREDOMINANTLY PMN MODERATE GRAM POSITIVE COCCI IN PAIRS IN CLUSTERS FEW GRAM POSITIVE RODS RARE GRAM NEGATIVE RODS    Culture   Final    ABUNDANT METHICILLIN RESISTANT STAPHYLOCOCCUS AUREUS ABUNDANT ENTEROBACTER SPECIES    Report Status 07/30/2016 FINAL  Final   Organism ID, Bacteria METHICILLIN RESISTANT STAPHYLOCOCCUS AUREUS  Final   Organism ID, Bacteria ENTEROBACTER SPECIES  Final      Susceptibility   Enterobacter species - MIC*    CEFAZOLIN >=64 RESISTANT Resistant     CEFEPIME 4 SENSITIVE Sensitive     CEFTAZIDIME >=64 RESISTANT Resistant     CEFTRIAXONE >=64 RESISTANT Resistant     CIPROFLOXACIN 1 SENSITIVE Sensitive     GENTAMICIN >=16 RESISTANT Resistant     IMIPENEM 1 SENSITIVE Sensitive     TRIMETH/SULFA <=20 SENSITIVE Sensitive     PIP/TAZO >=128 RESISTANT Resistant     * ABUNDANT ENTEROBACTER SPECIES   Methicillin resistant staphylococcus aureus - MIC*    CIPROFLOXACIN >=8 RESISTANT Resistant     ERYTHROMYCIN <=0.25 SENSITIVE Sensitive     GENTAMICIN <=0.5 SENSITIVE Sensitive     OXACILLIN >=4 RESISTANT Resistant     TETRACYCLINE <=1 SENSITIVE Sensitive     VANCOMYCIN <=0.5 SENSITIVE Sensitive     TRIMETH/SULFA <=10 SENSITIVE Sensitive     CLINDAMYCIN <=0.25 SENSITIVE Sensitive     RIFAMPIN <=0.5 SENSITIVE Sensitive     Inducible Clindamycin NEGATIVE Sensitive     * ABUNDANT METHICILLIN RESISTANT STAPHYLOCOCCUS AUREUS    Culture, blood (Routine X 2) w Reflex to ID Panel     Status: Abnormal   Collection Time: 07/27/16 12:23 PM  Result Value Ref Range Status   Specimen Description BLOOD RIGHT ARM  Final   Special Requests BOTTLES DRAWN AEROBIC AND ANAEROBIC 5 CC  Final   Culture  Setup Time   Final    GRAM POSITIVE COCCI IN CLUSTERS AEROBIC BOTTLE ONLY CRITICAL RESULT CALLED TO, READ BACK BY AND VERIFIED WITH: M. Bitonti Pharm.D. 11:30 07/28/16 ([email or web address])    Culture METHICILLIN RESISTANT STAPHYLOCOCCUS AUREUS (A)  Final   Report Status 07/30/2016 FINAL  Final   Organism ID, Bacteria METHICILLIN RESISTANT STAPHYLOCOCCUS AUREUS  Final      Susceptibility   Methicillin resistant staphylococcus aureus - MIC*    CIPROFLOXACIN >=8 RESISTANT Resistant     ERYTHROMYCIN <=0.25 SENSITIVE Sensitive     GENTAMICIN <=0.5 SENSITIVE Sensitive     OXACILLIN >=4 RESISTANT Resistant     TETRACYCLINE <=1 SENSITIVE Sensitive     VANCOMYCIN <=0.5 SENSITIVE Sensitive     TRIMETH/SULFA <=10 SENSITIVE Sensitive     CLINDAMYCIN <=0.25 SENSITIVE Sensitive     RIFAMPIN <=0.5 SENSITIVE Sensitive     Inducible Clindamycin NEGATIVE Sensitive     * METHICILLIN RESISTANT STAPHYLOCOCCUS AUREUS  Culture, blood (Routine X 2) w Reflex to ID Panel     Status: None (Preliminary result)   Collection Time: 07/27/16 12:23 PM  Result Value Ref Range Status   Specimen Description BLOOD RIGHT ARM  Final   Special Requests BOTTLES DRAWN AEROBIC AND ANAEROBIC 5 CC  Final   Culture NO GROWTH 4 DAYS  Final   Report Status PENDING  Incomplete  Blood Culture ID Panel (Reflexed)     Status: Abnormal   Collection Time: 07/27/16 12:23 PM  Result Value Ref Range Status   Enterococcus species  NOT DETECTED NOT DETECTED Final   Listeria monocytogenes NOT DETECTED NOT DETECTED Final   Staphylococcus species DETECTED (A) NOT DETECTED Final    Comment: CRITICAL RESULT CALLED TO, READ BACK BY AND VERIFIED WITH: M. Bitonti Pharm.D. 11:30 07/28/16  ([email or web address])    Staphylococcus aureus DETECTED (A) NOT DETECTED Final    Comment: CRITICAL RESULT CALLED TO, READ BACK BY AND VERIFIED WITH: M. Bitonti Pharm.D. 11:30 07/28/16  ([email or web address])    Methicillin resistance DETECTED (A) NOT DETECTED Final    Comment: CRITICAL RESULT CALLED TO, READ BACK BY AND VERIFIED WITH: M. Bitonti Pharm.D. 11:30 07/28/16 (wilsonm)    Streptococcus species NOT DETECTED NOT DETECTED Final   Streptococcus agalactiae NOT DETECTED NOT DETECTED Final   Streptococcus pneumoniae NOT DETECTED NOT DETECTED Final   Streptococcus pyogenes NOT DETECTED NOT DETECTED Final   Acinetobacter baumannii NOT DETECTED NOT DETECTED Final   Enterobacteriaceae species NOT DETECTED NOT DETECTED Final   Enterobacter cloacae complex NOT DETECTED NOT DETECTED Final   Escherichia coli NOT DETECTED NOT DETECTED Final   Klebsiella oxytoca NOT DETECTED NOT DETECTED Final   Klebsiella pneumoniae NOT DETECTED NOT DETECTED Final   Proteus species NOT DETECTED NOT DETECTED Final   Serratia marcescens NOT DETECTED NOT DETECTED Final   Haemophilus influenzae NOT DETECTED NOT DETECTED Final   Neisseria meningitidis NOT DETECTED NOT DETECTED Final   Pseudomonas aeruginosa NOT DETECTED NOT DETECTED Final   Candida albicans NOT DETECTED NOT DETECTED Final   Candida glabrata NOT DETECTED NOT DETECTED Final   Candida krusei NOT DETECTED NOT DETECTED Final   Candida parapsilosis NOT DETECTED NOT DETECTED Final   Candida tropicalis NOT DETECTED NOT DETECTED Final  Culture, blood (routine x 2)     Status: None (Preliminary result)   Collection Time: 07/28/16  6:16 PM  Result Value Ref Range Status   Specimen Description BLOOD LEFT ANTECUBITAL  Final   Special Requests BOTTLES DRAWN AEROBIC AND ANAEROBIC 10CC EA  Final   Culture NO GROWTH 3 DAYS  Final   Report Status PENDING  Incomplete  Culture, blood (routine x 2)     Status: Abnormal   Collection Time: 07/28/16  6:24 PM  Result Value Ref Range  Status   Specimen Description BLOOD RIGHT ANTECUBITAL  Final   Special Requests IN PEDIATRIC BOTTLE 1.5CC  Final   Culture  Setup Time   Final    GRAM POSITIVE COCCI IN CLUSTERS AEROBIC BOTTLE ONLY CRITICAL RESULT CALLED TO, READ BACK BY AND VERIFIED WITH: A MYER 07/29/16 @ 1841 M VESTAL    Culture (A)  Final    STAPHYLOCOCCUS AUREUS SUSCEPTIBILITIES PERFORMED ON PREVIOUS CULTURE WITHIN THE LAST 5 DAYS.    Report Status 07/31/2016 FINAL  Final  Culture, Urine     Status: None (Preliminary result)   Collection Time: 07/31/16  7:00 PM  Result Value Ref Range Status   Specimen Description URINE, CLEAN CATCH  Final   Special Requests NONE  Final   Culture CULTURE REINCUBATED FOR BETTER GROWTH  Final   Report Status PENDING  Incomplete         Radiology Studies: Ir Gastrostomy Tube Mod Sed  Result Date: 08/01/2016 CLINICAL DATA:  Respiratory failure and aspiration pneumonia. The patient requires percutaneous gastrostomy for long-term nutritional needs. EXAM: PERCUTANEOUS GASTROSTOMY TUBE PLACEMENT ANESTHESIA/SEDATION: 1.0 mg IV Versed; 50 mcg IV Fentanyl. Total Moderate Sedation Time 6 minutes. The patient's level of consciousness and physiologic status were continuously monitored during the procedure by Radiology nursing. CONTRAST:  20 mL Isovue-300 injected into the stomach. MEDICATIONS: A scheduled dose of IV cefepime was administered approximately 1 hour prior to the procedure. FLUOROSCOPY TIME:  1 minute and 42 seconds. PROCEDURE: The procedure, risks, benefits, and alternatives were explained to the patient's wife. Questions regarding the procedure were encouraged and answered. The patient's wife understands and consents to the procedure. A time-out was performed prior to the procedure. A 5-French catheter was then advanced through the the patient's mouth under fluoroscopy into the esophagus and to the level of the stomach. This catheter was used to insufflate the stomach with air under  fluoroscopy. The abdominal wall was prepped with Betadine in a sterile fashion, and a sterile drape was applied covering the operative field. A sterile gown and sterile gloves were used for the procedure. Local anesthesia was provided with 1% Lidocaine. A skin incision was made in the upper abdominal wall. Under fluoroscopy, an 18 gauge trocar needle was advanced into the stomach. Contrast injection was performed to confirm intraluminal position of the needle tip. A single T tack was then deployed in the lumen of the stomach. This was brought up to tension at the skin surface. Over a guidewire, a 9-French sheath was advanced into the lumen of the stomach. The wire was left in place as a safety wire. A loop snare device from a percutaneous gastrostomy kit was then advanced into the stomach. A floppy guide wire was advanced through the orogastric catheter under fluoroscopy in the stomach. The loop snare advanced through the percutaneous gastric access was used to snare the guide wire. This allowed withdrawal of the loop snare out of the patient's mouth by retraction of the orogastric catheter and wire. A 20-French bumper retention gastrostomy tube was looped around the snare device. It was then pulled back through the patient's mouth. The retention bumper was brought up to the anterior gastric wall. The T tack suture was cut at the skin. The exiting gastrostomy tube was cut to appropriate length and a feeding adapter applied. The catheter was injected with contrast material to confirm position and a fluoroscopic spot image saved. The tube was then flushed with saline. A dressing was applied over the gastrostomy exit site. COMPLICATIONS: None. FINDINGS: The stomach distended well with air allowing safe placement of the gastrostomy tube. After placement, the tip of the gastrostomy tube lies in the body of the stomach. IMPRESSION: Percutaneous gastrostomy with placement of a 20-French bumper retention tube in the body of  the stomach. This tube can be used for percutaneous feeds beginning in 24 hours after placement. Electronically Signed   By: Aletta Edouard M.D.   On: 08/01/2016 16:38        Scheduled Meds: . aspirin  325 mg Per Tube Daily  . atorvastatin  20 mg Per Tube q1800  . ceFEPime (MAXIPIME) IV  2 g Intravenous Q8H  . chlorhexidine  15 mL Mouth Rinse BID  . famotidine  20 mg Per Tube BID  . fludrocortisone  0.1 mg Per Tube Daily  . folic acid  1 mg Per Tube Daily  . heparin subcutaneous  5,000 Units Subcutaneous Q8H  . hydrocortisone  50 mg Per Tube Q6H  . insulin aspart  0-20 Units Subcutaneous Q4H  . levETIRAcetam  1,500 mg Per Tube BID  . mouth rinse  15 mL Mouth Rinse q12n4p  . potassium chloride  40 mEq Per Tube TID  . QUEtiapine  100 mg Per Tube BID  . thiamine  100 mg Per  Tube Daily  . Valproate Sodium  750 mg Per Tube Q8H  . vancomycin  500 mg Per Tube Q6H  . vancomycin  1,000 mg Intravenous Q12H   Continuous Infusions: . sodium chloride 10 mL/hr at 07/31/16 0800  . feeding supplement (VITAL AF 1.2 CAL) 1,000 mL (08/01/16 1716)     LOS: 21 days    Time spent: 40 minutes    Bobby Barton, Geraldo Docker, MD [facility] Pager [phone]   If 7PM-7AM, please contact night-coverage [email or web address] Password [facility] 08/02/2016, 7:55 AM

## 2016-08-03 DIAGNOSIS — B9562 Methicillin resistant Staphylococcus aureus infection as the cause of diseases classified elsewhere: Secondary | ICD-10-CM

## 2016-08-03 LAB — CULTURE, BLOOD (ROUTINE X 2): CULTURE: NO GROWTH

## 2016-08-03 LAB — URINE CULTURE: Culture: >=100000 — AB

## 2016-08-03 LAB — GLUCOSE, CAPILLARY
GLUCOSE-CAPILLARY: 105 mg/dL — AB (ref 65–99)
GLUCOSE-CAPILLARY: 87 mg/dL (ref 65–99)
GLUCOSE-CAPILLARY: 119 mg/dL — AB (ref 65–99)
Glucose-Capillary: 92 mg/dL (ref 65–99)
Glucose-Capillary: 97 mg/dL (ref 65–99)
Glucose-Capillary: 86 mg/dL (ref 65–99)

## 2016-08-03 MED ORDER — LORAZEPAM 2 MG/ML IJ SOLN: 1.0000 mg | Freq: Four times a day (QID) | INTRAMUSCULAR | Status: DC | PRN

## 2016-08-03 MED ORDER — HALOPERIDOL LACTATE 5 MG/ML IJ SOLN
1.0000 mg | Freq: Four times a day (QID) | INTRAMUSCULAR | Status: DC | PRN
  Administered 2016-08-04: 1 mg via INTRAVENOUS
  Filled 2016-08-03 (×2): qty 1

## 2016-08-03 MED ORDER — VANCOMYCIN HCL IN DEXTROSE 1-5 GM/200ML-% IV SOLN
1000.0000 mg | Freq: Two times a day (BID) | INTRAVENOUS | Status: DC
  Administered 2016-08-03 – 2016-08-06 (×7): 1000 mg via INTRAVENOUS
  Filled 2016-08-03 (×8): qty 200

## 2016-08-03 NOTE — Progress Notes (Signed)
Received a page that patient family was at bedside.  I went to his room to attempt to meet with them, but they had already departed.  Staff reports family was present for <10 minutes.  Will continue to engage family as they are willing, however, I am concerned that they are avoiding meeting with our team at this point.  NO CHARGE  Romie MinusGene Raizy Auzenne, MD [facility] Team [phone]

## 2016-08-03 NOTE — Progress Notes (Signed)
TEAM 1 - Stepdown/ICU TEAM  [facility] [employer]  ZOX:[id number] DOB: 1966-05-02 DOA: 07/12/2016 PCP: No primary care provider on file.    Brief Narrative:  50 year old M who presented from [facility] w/ a Hx of multiple admissions since April 2017, Depression, Dementia; Seizures; Streptococcal Meningoencephalitis/Meningitis, Cerebral thrombosis with cerebral infarction; Cerebral septic emboli, NSTEMI, Cardiac arrest w/ Cardiogenic shock; Chronic Systolic and diastolic CHF, Acute respiratory failure with hypoxia; Substance abuse; and ETOH abuse who was admitted again 8/08 with altered mental status and an inability to protect his airway. He was hypotensive and hypothermic from UTI, acute resp failure, and severe delirium.  Subjective: The patient is noncommunicative.   He does not appear uncomfortable.  There is no evidnece of respiratory distress or uncontrolled pain.  Assessment & Plan:  Acute on chronic hypoxic respiratory failure  -due to inability to protect airway -hx trach March 2017 (s/p decannulation) -vent/trach management per PCCM   MRSA bacteremia  abx per ID - blood cx 8/24 again 2/2 for MRSA  Enterobacter & MRSA HCAP Remains on abx coverage as directed by ID   C diff colitis Cont enteral vanc   Septic shock due to multiple infections See each issue below - septic shock has resolved at this time   Encephalopathy Remains sedate today - MRI brain 8/29 noted enlarging area of L MCA infract, but this appears subacute - no clear cause of change in mental status over last 24-36hrs   VRE faecium UTI v/s colonization  Follow clinically w/o change in abx   Chronic adrenal insufficiency Cont maintenance steroid dosing   Chronic combined systolic and diastolic CHF ?accuracy of weight - no evidence on exam of signif volume overload   Filed Weights   08/01/16 0359 08/02/16 0309 08/03/16 0500  Weight: 64 kg (141 lb 1.5 oz) 63.7 kg (140 lb 6.9 oz) 60.8 kg (134 lb  0.6 oz)   Hypokalemia corrected  Protein calorie malnutrition Cont tube feeds via PEG   Anemia of chronic disease and critical illness Hgb stable/improving   Recent Labs Lab 07/29/16 0415 07/30/16 0249 07/31/16 0250 08/01/16 0119 08/02/16 0303  HGB 7.3* 7.8* 9.1* 9.1* 10.3*   Seizures  Strep pneumo meningitis March 2017  CVA  DVT prophylaxis: SQ heparin  Code Status: FULL CODE Family Communication: no family present at time of exam  Disposition Plan: SDU   Consultants:  Palliative Care PCCM  Procedures/Events: 8/08 admitted with unresponsiveness, required intubation 8/09 low cvp, levophed, possible seizure  8/10 not moving rt ext upper and lowers - EEG suppressed background  8/11 MRI brain multifocal acute Lt MCA ischemic infarcts, possible infarct Rt caudate head 8/13 Following commands 8/15 Following Commands, but agitated, Norepi off 8/15 am 8/18 trach  8/23 asp concerns  Antimicrobials:  Vanc 8/8 > 8/10 Unasyn 8/23 > 8/27 Zosyn 8/8 > 8/11 Meropenem 8/11 > 8/18 Oral Vanc 8/12 > Vanc IV 8/24 >  Cefepime 8/27 >  Objective: Blood pressure (!) 142/83, pulse (!) 52, temperature 97.4 F (36.3 C), temperature source Oral, resp. rate (!) 9, height 5\' 8"  (1.727 m), weight 60.8 kg (134 lb 0.6 oz), SpO2 100 %.  Intake/Output Summary (Last 24 hours) at 08/03/16 1203 Last data filed at 08/03/16 0600  Gross per 24 hour  Intake          1784.67 ml  Output             1700 ml  Net  84.67 ml   Filed Weights   08/01/16 0359 08/02/16 0309 08/03/16 0500  Weight: 64 kg (141 lb 1.5 oz) 63.7 kg (140 lb 6.9 oz) 60.8 kg (134 lb 0.6 oz)    Examination: General: No acute respiratory distress - sedate  Lungs: Poor air movement bilateral bases - no wheezing Cardiovascular: Regular rate and rhythm without murmur  Abdomen: Nontender, nondistended, soft, bowel sounds positive, no rebound, no ascites, no appreciable mass - PEG in place  Extremities: No  significant cyanosis, clubbing, edema bilateral lower extremities  CBC:  Recent Labs Lab 07/29/16 0415 07/30/16 0249 07/31/16 0250 08/01/16 0119 08/02/16 0303  WBC 11.9* 11.3* 7.8 7.9 6.3  HGB 7.3* 7.8* 9.1* 9.1* 10.3*  HCT 22.6* 24.5* 28.5* 28.0* 31.0*  MCV 90.8 90.7 89.1 87.2 88.3  PLT 291 303 309 266 237   Basic Metabolic Panel:  Recent Labs Lab 07/28/16 0330 07/29/16 0415 07/30/16 0249 07/31/16 0250 07/31/16 1337 08/01/16 0119 08/02/16 0303  NA 137 144 145 146*  --  143 142  K 3.4* 3.6 3.4* 2.8* 4.0 2.9* 3.6  CL 104 109 112* 110  --  107 103  CO2 26 27 22 25   --  26 29  GLUCOSE 91 93 112* 96  --  119* 112*  BUN 10 6 16 18   --  16 17  CREATININE 0.63 0.69 0.68 0.67  --  0.61 0.58*  CALCIUM 8.3* 8.7* 8.7* 8.6*  --  8.6* 8.5*  MG 1.7  --   --   --  1.9 1.7 2.3  PHOS 4.4  --   --   --   --   --   --    GFR: Estimated Creatinine Clearance: 93.9 mL/min (by C-G formula based on SCr of 0.8 mg/dL).  Liver Function Tests:  Recent Labs Lab 08/02/16 0303  AST 17  ALT 13*  ALKPHOS 47  BILITOT 0.3  PROT 5.2*  ALBUMIN 2.0*    Coagulation Profile:  Recent Labs Lab 07/29/16 0415  INR 1.18   HbA1C: Hgb A1c MFr Bld  Date/Time Value Ref Range Status  05/20/2016 04:20 AM 5.3 4.8 - 5.6 % Final    Comment:    (NOTE)         Pre-diabetes: 5.7 - 6.4         Diabetes: >6.4         Glycemic control for adults with diabetes: <7.0   03/27/2016 05:35 AM 5.8 (H) 4.8 - 5.6 % Final    Comment:    (NOTE)         Pre-diabetes: 5.7 - 6.4         Diabetes: >6.4         Glycemic control for adults with diabetes: <7.0     CBG:  Recent Labs Lab 08/02/16 1714 08/02/16 1955 08/02/16 2326 08/03/16 0441 08/03/16 0727  GLUCAP 77 73 123* 105* 92    Recent Results (from the past 240 hour(s))  Urine culture     Status: Abnormal   Collection Time: 07/27/16 11:15 AM  Result Value Ref Range Status   Specimen Description URINE, RANDOM  Final   Special Requests NONE   Final   Culture (A)  Final    >=100,000 COLONIES/mL ENTEROCOCCUS FAECIUM VANCOMYCIN RESISTANT ENTEROCOCCUS    Report Status 07/30/2016 FINAL  Final   Organism ID, Bacteria ENTEROCOCCUS FAECIUM (A)  Final      Susceptibility   Enterococcus faecium - MIC*    AMPICILLIN >=32 RESISTANT Resistant  LEVOFLOXACIN >=8 RESISTANT Resistant     NITROFURANTOIN 128 RESISTANT Resistant     VANCOMYCIN >=32 RESISTANT Resistant     LINEZOLID 2 SENSITIVE Sensitive     * >=100,000 COLONIES/mL ENTEROCOCCUS FAECIUM  Culture, respiratory (NON-Expectorated)     Status: None   Collection Time: 07/27/16 11:15 AM  Result Value Ref Range Status   Specimen Description TRACHEAL ASPIRATE  Final   Special Requests NONE  Final   Gram Stain   Final    MODERATE WBC PRESENT, PREDOMINANTLY PMN MODERATE GRAM POSITIVE COCCI IN PAIRS IN CLUSTERS FEW GRAM POSITIVE RODS RARE GRAM NEGATIVE RODS    Culture   Final    ABUNDANT METHICILLIN RESISTANT STAPHYLOCOCCUS AUREUS ABUNDANT ENTEROBACTER SPECIES    Report Status 07/30/2016 FINAL  Final   Organism ID, Bacteria METHICILLIN RESISTANT STAPHYLOCOCCUS AUREUS  Final   Organism ID, Bacteria ENTEROBACTER SPECIES  Final      Susceptibility   Enterobacter species - MIC*    CEFAZOLIN >=64 RESISTANT Resistant     CEFEPIME 4 SENSITIVE Sensitive     CEFTAZIDIME >=64 RESISTANT Resistant     CEFTRIAXONE >=64 RESISTANT Resistant     CIPROFLOXACIN 1 SENSITIVE Sensitive     GENTAMICIN >=16 RESISTANT Resistant     IMIPENEM 1 SENSITIVE Sensitive     TRIMETH/SULFA <=20 SENSITIVE Sensitive     PIP/TAZO >=128 RESISTANT Resistant     * ABUNDANT ENTEROBACTER SPECIES   Methicillin resistant staphylococcus aureus - MIC*    CIPROFLOXACIN >=8 RESISTANT Resistant     ERYTHROMYCIN <=0.25 SENSITIVE Sensitive     GENTAMICIN <=0.5 SENSITIVE Sensitive     OXACILLIN >=4 RESISTANT Resistant     TETRACYCLINE <=1 SENSITIVE Sensitive     VANCOMYCIN <=0.5 SENSITIVE Sensitive     TRIMETH/SULFA  <=10 SENSITIVE Sensitive     CLINDAMYCIN <=0.25 SENSITIVE Sensitive     RIFAMPIN <=0.5 SENSITIVE Sensitive     Inducible Clindamycin NEGATIVE Sensitive     * ABUNDANT METHICILLIN RESISTANT STAPHYLOCOCCUS AUREUS  Culture, blood (Routine X 2) w Reflex to ID Panel     Status: Abnormal   Collection Time: 07/27/16 12:23 PM  Result Value Ref Range Status   Specimen Description BLOOD RIGHT ARM  Final   Special Requests BOTTLES DRAWN AEROBIC AND ANAEROBIC 5 CC  Final   Culture  Setup Time   Final    GRAM POSITIVE COCCI IN CLUSTERS AEROBIC BOTTLE ONLY CRITICAL RESULT CALLED TO, READ BACK BY AND VERIFIED WITH: M. Bitonti Pharm.D. 11:30 07/28/16 ([email or web address])    Culture METHICILLIN RESISTANT STAPHYLOCOCCUS AUREUS (A)  Final   Report Status 07/30/2016 FINAL  Final   Organism ID, Bacteria METHICILLIN RESISTANT STAPHYLOCOCCUS AUREUS  Final      Susceptibility   Methicillin resistant staphylococcus aureus - MIC*    CIPROFLOXACIN >=8 RESISTANT Resistant     ERYTHROMYCIN <=0.25 SENSITIVE Sensitive     GENTAMICIN <=0.5 SENSITIVE Sensitive     OXACILLIN >=4 RESISTANT Resistant     TETRACYCLINE <=1 SENSITIVE Sensitive     VANCOMYCIN <=0.5 SENSITIVE Sensitive     TRIMETH/SULFA <=10 SENSITIVE Sensitive     CLINDAMYCIN <=0.25 SENSITIVE Sensitive     RIFAMPIN <=0.5 SENSITIVE Sensitive     Inducible Clindamycin NEGATIVE Sensitive     * METHICILLIN RESISTANT STAPHYLOCOCCUS AUREUS  Culture, blood (Routine X 2) w Reflex to ID Panel     Status: None (Preliminary result)   Collection Time: 07/27/16 12:23 PM  Result Value Ref Range Status   Specimen Description BLOOD RIGHT  ARM  Final   Special Requests BOTTLES DRAWN AEROBIC AND ANAEROBIC 5 CC  Final   Culture NO GROWTH 4 DAYS  Final   Report Status PENDING  Incomplete  Blood Culture ID Panel (Reflexed)     Status: Abnormal   Collection Time: 07/27/16 12:23 PM  Result Value Ref Range Status   Enterococcus species NOT DETECTED NOT DETECTED Final   Listeria  monocytogenes NOT DETECTED NOT DETECTED Final   Staphylococcus species DETECTED (A) NOT DETECTED Final    Comment: CRITICAL RESULT CALLED TO, READ BACK BY AND VERIFIED WITH: M. Bitonti Pharm.D. 11:30 07/28/16 ([email or web address])    Staphylococcus aureus DETECTED (A) NOT DETECTED Final    Comment: CRITICAL RESULT CALLED TO, READ BACK BY AND VERIFIED WITH: M. Bitonti Pharm.D. 11:30 07/28/16  ([email or web address])    Methicillin resistance DETECTED (A) NOT DETECTED Final    Comment: CRITICAL RESULT CALLED TO, READ BACK BY AND VERIFIED WITH: M. Bitonti Pharm.D. 11:30 07/28/16 ([email or web address])    Streptococcus species NOT DETECTED NOT DETECTED Final   Streptococcus agalactiae NOT DETECTED NOT DETECTED Final   Streptococcus pneumoniae NOT DETECTED NOT DETECTED Final   Streptococcus pyogenes NOT DETECTED NOT DETECTED Final   Acinetobacter baumannii NOT DETECTED NOT DETECTED Final   Enterobacteriaceae species NOT DETECTED NOT DETECTED Final   Enterobacter cloacae complex NOT DETECTED NOT DETECTED Final   Escherichia coli NOT DETECTED NOT DETECTED Final   Klebsiella oxytoca NOT DETECTED NOT DETECTED Final   Klebsiella pneumoniae NOT DETECTED NOT DETECTED Final   Proteus species NOT DETECTED NOT DETECTED Final   Serratia marcescens NOT DETECTED NOT DETECTED Final   Haemophilus influenzae NOT DETECTED NOT DETECTED Final   Neisseria meningitidis NOT DETECTED NOT DETECTED Final   Pseudomonas aeruginosa NOT DETECTED NOT DETECTED Final   Candida albicans NOT DETECTED NOT DETECTED Final   Candida glabrata NOT DETECTED NOT DETECTED Final   Candida krusei NOT DETECTED NOT DETECTED Final   Candida parapsilosis NOT DETECTED NOT DETECTED Final   Candida tropicalis NOT DETECTED NOT DETECTED Final  Culture, blood (routine x 2)     Status: None   Collection Time: 07/28/16  6:16 PM  Result Value Ref Range Status   Specimen Description BLOOD LEFT ANTECUBITAL  Final   Special Requests BOTTLES DRAWN AEROBIC AND ANAEROBIC 10CC EA  Final    Culture NO GROWTH 5 DAYS  Final   Report Status 08/02/2016 FINAL  Final  Culture, blood (routine x 2)     Status: Abnormal   Collection Time: 07/28/16  6:24 PM  Result Value Ref Range Status   Specimen Description BLOOD RIGHT ANTECUBITAL  Final   Special Requests IN PEDIATRIC BOTTLE 1.5CC  Final   Culture  Setup Time   Final    GRAM POSITIVE COCCI IN CLUSTERS AEROBIC BOTTLE ONLY CRITICAL RESULT CALLED TO, READ BACK BY AND VERIFIED WITH: A MYER 07/29/16 @ 1841 M VESTAL    Culture (A)  Final    STAPHYLOCOCCUS AUREUS SUSCEPTIBILITIES PERFORMED ON PREVIOUS CULTURE WITHIN THE LAST 5 DAYS.    Report Status 07/31/2016 FINAL  Final  Culture, Urine     Status: None (Preliminary result)   Collection Time: 07/31/16  7:00 PM  Result Value Ref Range Status   Specimen Description URINE, CLEAN CATCH  Final   Special Requests NONE  Final   Culture CULTURE REINCUBATED FOR BETTER GROWTH  Final   Report Status PENDING  Incomplete     Scheduled Meds: . aspirin  325 mg Per Tube Daily  .  atorvastatin  20 mg Per Tube q1800  . ceFEPime (MAXIPIME) IV  2 g Intravenous Q8H  . chlorhexidine  15 mL Mouth Rinse BID  . famotidine  20 mg Per Tube BID  . fludrocortisone  0.1 mg Per Tube Daily  . folic acid  1 mg Per Tube Daily  . heparin subcutaneous  5,000 Units Subcutaneous Q8H  . hydrocortisone  50 mg Per Tube Q6H  . insulin aspart  0-20 Units Subcutaneous Q4H  . levETIRAcetam  1,500 mg Per Tube BID  . mouth rinse  15 mL Mouth Rinse q12n4p  . QUEtiapine  100 mg Per Tube BID  . thiamine  100 mg Per Tube Daily  . Valproate Sodium  750 mg Per Tube Q8H  . vancomycin  500 mg Per Tube Q6H  . vancomycin  1,000 mg Intravenous Q12H    LOS: 22 days   Lonia Blood, MD [facility] Office  [phone] Pager - Text Page per Loretha Stapler as per below:  On-Call/Text Page:      [email or web address]      password [email or web address]  If 7PM-7AM, please contact night-coverage [email or web address] Password [email or web address] 08/03/2016, 12:03  PM

## 2016-08-03 NOTE — NC FL2 (Signed)
[place] MEDICAID FL2 LEVEL OF CARE SCREENING TOOL     IDENTIFICATION  Patient Name: RAQUEL SAYRES Birthdate: 14-Oct-1966 Sex: male Admission Date (Current Location): 07/12/2016  [facility] and [place] Number:  Producer, television/film/video and Address:  [facility] [place], [address], [place], [place] [address]      Provider Number: [id number]  Attending Physician Name and Address:  Lonia Blood, MD  Relative Name and Phone Number:       Current Level of Care: Hospital Recommended Level of Care: Skilled Nursing Facility Prior Approval Number:    Date Approved/Denied:   PASRR Number: [id number]  Discharge Plan: SNF    Current Diagnoses: Patient Active Problem List   Diagnosis Date Noted  . HCAP (healthcare-associated pneumonia)   . Pressure ulcer 07/29/2016  . MRSA bacteremia 07/28/2016  . C. difficile colitis 07/28/2016  . Aspiration pneumonia (HCC) 07/28/2016  . Syphilis 07/28/2016  . New onset a-fib (HCC)   . Encephalopathy 07/12/2016  . Arterial hypotension   . Adrenal insufficiency (Addison's disease) (HCC)   . Bradycardia   . Hypoglycemia 07/02/2016  . Seizures (HCC)   . HLD (hyperlipidemia) 05/04/2016  . Dysphagia 05/04/2016  . Moderate protein-calorie malnutrition (HCC)   . Palliative care encounter   . Cerebral thrombosis with cerebral infarction 03/25/2016  . Chronic combined systolic and diastolic CHF (congestive heart failure) (HCC)   . Hypothermia 03/20/2016  . NSTEMI (non-ST elevated myocardial infarction) (HCC) 03/14/2016  . Tracheostomy status (HCC)   . Acute on chronic respiratory failure with hypoxia (HCC) 03/03/2016  . Septic shock (HCC)   . Cardiogenic shock (HCC)   . Cardiac arrest (HCC)   . Homeless 02/16/2016  . History of bacterial meningitis 02/16/2016  . Tobacco abuse 02/16/2016  . ETOH abuse   . Substance abuse   . Acute respiratory failure (HCC)     Orientation RESPIRATION BLADDER Height & Weight     Self  Tracheostomy, O2 (28% trach collar- 6mm Shiley) External catheter Weight: 60.8 kg (134 lb 0.6 oz) Height:  5\' 8"  (172.7 cm)  BEHAVIORAL SYMPTOMS/MOOD NEUROLOGICAL BOWEL NUTRITION STATUS      Incontinent (gastrostomy) Feeding tube  AMBULATORY STATUS COMMUNICATION OF NEEDS Skin   Total Care Does not communicate Normal                       Personal Care Assistance Level of Assistance  Bathing, Dressing, Feeding Bathing Assistance: Maximum assistance Feeding assistance: Maximum assistance Dressing Assistance: Maximum assistance Total Care Assistance: Maximum assistance   Functional Limitations Info  Sight, Hearing Sight Info: Impaired Hearing Info: Impaired Speech Info: Impaired    SPECIAL CARE FACTORS FREQUENCY  PT (By licensed PT), OT (By licensed OT)                    Contractures Contractures Info: Not present    Additional Factors Info  Code Status, Allergies, Psychotropic, Insulin Sliding Scale, Isolation Precautions, Suctioning Needs Code Status Info: FULL Allergies Info: NKA Psychotropic Info: seroquel Insulin Sliding Scale Info: q4 hours Isolation Precautions Info: VRE, MRSA Suctioning Needs: very limited- pt able to cough up most of the secretions   Current Medications (08/03/2016):  This is the current hospital active medication list Current Facility-Administered Medications  Medication Dose Route Frequency Provider Last Rate Last Dose  . 0.9 %  sodium chloride infusion   Intravenous Continuous Drema Dallas, MD 50 mL/hr at 08/03/16 [phone]    .  acetaminophen (TYLENOL) solution 650 mg  650 mg Per Tube Q6H PRN Alyson ReedyWesam G Yacoub, MD   650 mg at 07/27/16 0831  . albuterol (PROVENTIL) (2.5 MG/3ML) 0.083% nebulizer solution 2.5 mg  2.5 mg Nebulization Q3H PRN Rahul P Desai, PA-C      . aspirin tablet 325 mg  325 mg Per Tube Daily Rahul P Desai, PA-C   325 mg at 08/03/16 0925  . atorvastatin (LIPITOR) tablet 20 mg  20 mg Per Tube q1800 Rahul P Desai, PA-C   20 mg  at 08/02/16 1708  . ceFEPIme (MAXIPIME) 2 g in dextrose 5 % 50 mL IVPB  2 g Intravenous Q8H Kimberly B Hammons, RPH   2 g at 08/03/16 0545  . chlorhexidine (PERIDEX) 0.12 % solution 15 mL  15 mL Mouth Rinse BID Jenita SeashoreKai N Kang, RPH   15 mL at 08/03/16 0926  . famotidine (PEPCID) 40 MG/5ML suspension 20 mg  20 mg Per Tube BID Lupita Leashouglas B McQuaid, MD   20 mg at 08/03/16 0926  . feeding supplement (VITAL AF 1.2 CAL) liquid 1,000 mL  1,000 mL Per Tube Continuous Nelda Bucksaniel J Feinstein, MD 60 mL/hr at 08/03/16 0722 1,000 mL at 08/03/16 [id number]  . fentaNYL (SUBLIMAZE) injection 25-50 mcg  25-50 mcg Intravenous Q2H PRN Lonia BloodJeffrey T McClung, MD   50 mcg at 08/01/16 2100  . fludrocortisone (FLORINEF) tablet 0.1 mg  0.1 mg Per Tube Daily Rahul P Desai, PA-C   0.1 mg at 08/03/16 0926  . folic acid (FOLVITE) tablet 1 mg  1 mg Per Tube Daily Lupita Leashouglas B McQuaid, MD   1 mg at 08/03/16 [id number]  . haloperidol lactate (HALDOL) injection 2 mg  2 mg Intravenous Q6H PRN Drema Dallasurtis J Woods, MD   2 mg at 08/01/16 1639  . heparin injection 5,000 Units  5,000 Units Subcutaneous Q8H Ralene MuskratPamela Turpin, PA-C   5,000 Units at 08/03/16 0545  . hydrocortisone (CORTEF) tablet 50 mg  50 mg Per Tube Q6H Lonia BloodJeffrey T McClung, MD   50 mg at 08/03/16 0553  . insulin aspart (novoLOG) injection 0-20 Units  0-20 Units Subcutaneous Q4H Rahul P Desai, PA-C   3 Units at 08/03/16 0018  . levETIRAcetam (KEPPRA) 100 MG/ML solution 1,500 mg  1,500 mg Per Tube BID Coralyn HellingVineet Sood, MD   1,500 mg at 08/03/16 0926  . LORazepam (ATIVAN) injection 1 mg  1 mg Intravenous Q4H PRN Lonia BloodJeffrey T McClung, MD   1 mg at 08/01/16 2101  . MEDLINE mouth rinse  15 mL Mouth Rinse q12n4p Jenita SeashoreKai N Kang, RPH   15 mL at 08/02/16 1708  . QUEtiapine (SEROQUEL) tablet 100 mg  100 mg Per Tube BID Leslye Peerobert S Byrum, MD   100 mg at 08/03/16 0926  . thiamine (VITAMIN B-1) tablet 100 mg  100 mg Per Tube Daily Lupita Leashouglas B McQuaid, MD   100 mg at 08/03/16 0925  . Valproate Sodium (DEPAKENE) solution 750 mg  750 mg Per Tube  Q8H Coralyn HellingVineet Sood, MD   750 mg at 08/03/16 0545  . vancomycin (VANCOCIN) 50 mg/mL oral solution 500 mg  500 mg Per Tube Q6H Karl ItoSteven E Sommer, MD   500 mg at 08/03/16 0545  . vancomycin (VANCOCIN) IVPB 1000 mg/200 mL premix  1,000 mg Intravenous Q12H Cliffton AstersJohn Campbell, MD         Discharge Medications: Please see discharge summary for a list of discharge medications.  Relevant Imaging Results:  Relevant Lab Results:   Additional Information SSN:  [id number]  Jorge Ny, LCSW

## 2016-08-03 NOTE — Progress Notes (Signed)
Patient ID: Dale [name], male   DOB: [birth year], 50 y.o.   MRN: [id number]          [facility]    Date of Admission:  07/12/2016           Day 18 oral vancomycin        Day 7 IV vancomycin        Day 2 cefepime  Active Problems:   MRSA bacteremia   C. difficile colitis   Aspiration pneumonia (HCC)   History of bacterial meningitis   Homeless   Tobacco abuse   ETOH abuse   Substance abuse   Cardiogenic shock (HCC)   Cardiac arrest (HCC)   Acute on chronic respiratory failure with hypoxia (HCC)   Tracheostomy status (HCC)   NSTEMI (non-ST elevated myocardial infarction) (HCC)   Chronic combined systolic and diastolic CHF (congestive heart failure) (HCC)   Cerebral thrombosis with cerebral infarction   Moderate protein-calorie malnutrition (HCC)   HLD (hyperlipidemia)   Dysphagia   Seizures (HCC)   Adrenal insufficiency (Addison's disease) (HCC)   Encephalopathy   Arterial hypotension   New onset a-fib (HCC)   Syphilis   Pressure ulcer   HCAP (healthcare-associated pneumonia)   . aspirin  325 mg Per Tube Daily  . atorvastatin  20 mg Per Tube q1800  . ceFEPime (MAXIPIME) IV  2 g Intravenous Q8H  . chlorhexidine  15 mL Mouth Rinse BID  . famotidine  20 mg Per Tube BID  . fludrocortisone  0.1 mg Per Tube Daily  . folic acid  1 mg Per Tube Daily  . heparin subcutaneous  5,000 Units Subcutaneous Q8H  . hydrocortisone  50 mg Per Tube Q6H  . insulin aspart  0-20 Units Subcutaneous Q4H  . levETIRAcetam  1,500 mg Per Tube BID  . mouth rinse  15 mL Mouth Rinse q12n4p  . QUEtiapine  100 mg Per Tube BID  . thiamine  100 mg Per Tube Daily  . Valproate Sodium  750 mg Per Tube Q8H  . vancomycin  500 mg Per Tube Q6H  . vancomycin  1,000 mg Intravenous Q12H   Review of Systems: Review of Systems  Unable to perform ROS: Intubated    Past Medical History:  Diagnosis Date  . Acute encephalopathy   . Acute ischemic stroke (HCC)   . Acute renal  failure (HCC)   . Acute respiratory failure with hypoxia (HCC)   . AKI (acute kidney injury) (HCC) 02/16/2016  . Altered mental status   . Cardiac arrest (HCC)   . Cardiogenic shock (HCC)   . Cerebral septic emboli (HCC)   . Cerebral thrombosis with cerebral infarction 03/25/2016  . CHF (congestive heart failure) (HCC)   . Convulsions/seizures (HCC) 05/04/2016   Seizure disorder - Continue Keppra, Depakote.     . Dementia 04/27/2016  . Depression   . Dysphagia 05/04/2016   Dysphagia - Dysphagia 1 diet per SLP     . ETOH abuse   . Hydrocephalus   . Hyperlipidemia 05/04/2016   Dyslipidemia - Continue statin.     Marland Kitchen Hypernatremia   . Hypothermia 03/20/2016  . Low serum cortisol level (HCC) 05/04/2016   Low a.m. cortisol  - Was 1.2 checked on 04/03/2016  - This likely is reflective of cortisol suppression from steroid use - Tapered steroids off   . Meningitis, pneumococcal, recurrent   . Meningitis, streptococcal 02/16/2016  . Meningoencephalitis   . NSTEMI (non-ST elevated myocardial infarction) (HCC)  03/14/2016  . Protein-calorie malnutrition, severe (HCC)   . Respiratory failure (HCC) 03/03/2016  . Septic shock (HCC)   . Severe sepsis (HCC)   . 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 Substance abuse   . Systolic and diastolic CHF, chronic (HCC)   . Tobacco abuse 02/16/2016  . 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 Urinary retention 05/04/2016   Urinary retention - Continue Flomax       Social History  Substance Use Topics  . Smoking status: Current Every Day Smoker    Packs/day: 0.10    Types: Cigarettes  . Smokeless tobacco: Never Used  . Alcohol use No    History reviewed. No pertinent family history. No Known Allergies  OBJECTIVE: Vitals:   08/03/16 0400 08/03/16 0500 08/03/16 0743 08/03/16 0755  BP: (!) 141/80  (!) 166/89   Pulse:   (!) 49 (!) 55  Resp: 10  12 14   Temp: 97.2 F (36.2 C)  97.5 F (36.4 C)   TempSrc: Oral  Oral   SpO2: 100%  100% 98%  Weight:   134 lb 0.6 oz (60.8 kg)    Height:       Body mass index is 20.38 kg/m.  Physical Exam  Constitutional:  He is less alert.  Cardiovascular: Normal rate and regular rhythm.   No murmur heard. Pulmonary/Chest: Breath sounds normal. He has no wheezes. He has no rales.  Abdominal: Soft.  His nurse reports that he is still having some diarrhea.    Lab Results Lab Results  Component Value Date   WBC 6.3 08/02/2016   HGB 10.3 (L) 08/02/2016   HCT 31.0 (L) 08/02/2016   MCV 88.3 08/02/2016   PLT 237 08/02/2016    Lab Results  Component Value Date   CREATININE 0.58 (L) 08/02/2016   BUN 17 08/02/2016   NA 142 08/02/2016   K 3.6 08/02/2016   CL 103 08/02/2016   CO2 29 08/02/2016    Lab Results  Component Value Date   ALT 13 (L) 08/02/2016   AST 17 08/02/2016   ALKPHOS 47 08/02/2016   BILITOT 0.3 08/02/2016     Microbiology: Recent Results (from the past 240 hour(s))  Urine culture     Status: Abnormal   Collection Time: 07/27/16 11:15 AM  Result Value Ref Range Status   Specimen Description URINE, RANDOM  Final   Special Requests NONE  Final   Culture (A)  Final    >=100,000 COLONIES/mL ENTEROCOCCUS FAECIUM VANCOMYCIN RESISTANT ENTEROCOCCUS    Report Status 07/30/2016 FINAL  Final   Organism ID, Bacteria ENTEROCOCCUS FAECIUM (A)  Final      Susceptibility   Enterococcus faecium - MIC*    AMPICILLIN >=32 RESISTANT Resistant     LEVOFLOXACIN >=8 RESISTANT Resistant     NITROFURANTOIN 128 RESISTANT Resistant     VANCOMYCIN >=32 RESISTANT Resistant     LINEZOLID 2 SENSITIVE Sensitive     * >=100,000 COLONIES/mL ENTEROCOCCUS FAECIUM  Culture, respiratory (NON-Expectorated)     Status: None   Collection Time: 07/27/16 11:15 AM  Result Value Ref Range Status   Specimen Description TRACHEAL ASPIRATE  Final   Special Requests NONE  Final   Gram Stain   Final    MODERATE WBC PRESENT, PREDOMINANTLY PMN MODERATE GRAM POSITIVE COCCI IN PAIRS IN CLUSTERS FEW GRAM POSITIVE  RODS RARE GRAM NEGATIVE RODS    Culture   Final    ABUNDANT METHICILLIN RESISTANT STAPHYLOCOCCUS AUREUS ABUNDANT ENTEROBACTER SPECIES    Report Status  07/30/2016 FINAL  Final   Organism ID, Bacteria METHICILLIN RESISTANT STAPHYLOCOCCUS AUREUS  Final   Organism ID, Bacteria ENTEROBACTER SPECIES  Final      Susceptibility   Enterobacter species - MIC*    CEFAZOLIN >=64 RESISTANT Resistant     CEFEPIME 4 SENSITIVE Sensitive     CEFTAZIDIME >=64 RESISTANT Resistant     CEFTRIAXONE >=64 RESISTANT Resistant     CIPROFLOXACIN 1 SENSITIVE Sensitive     GENTAMICIN >=16 RESISTANT Resistant     IMIPENEM 1 SENSITIVE Sensitive     TRIMETH/SULFA <=20 SENSITIVE Sensitive     PIP/TAZO >=128 RESISTANT Resistant     * ABUNDANT ENTEROBACTER SPECIES   Methicillin resistant staphylococcus aureus - MIC*    CIPROFLOXACIN >=8 RESISTANT Resistant     ERYTHROMYCIN <=0.25 SENSITIVE Sensitive     GENTAMICIN <=0.5 SENSITIVE Sensitive     OXACILLIN >=4 RESISTANT Resistant     TETRACYCLINE <=1 SENSITIVE Sensitive     VANCOMYCIN <=0.5 SENSITIVE Sensitive     TRIMETH/SULFA <=10 SENSITIVE Sensitive     CLINDAMYCIN <=0.25 SENSITIVE Sensitive     RIFAMPIN <=0.5 SENSITIVE Sensitive     Inducible Clindamycin NEGATIVE Sensitive     * ABUNDANT METHICILLIN RESISTANT STAPHYLOCOCCUS AUREUS  Culture, blood (Routine X 2) w Reflex to ID Panel     Status: Abnormal   Collection Time: 07/27/16 12:23 PM  Result Value Ref Range Status   Specimen Description BLOOD RIGHT ARM  Final   Special Requests BOTTLES DRAWN AEROBIC AND ANAEROBIC 5 CC  Final   Culture  Setup Time   Final    GRAM POSITIVE COCCI IN CLUSTERS AEROBIC BOTTLE ONLY CRITICAL RESULT CALLED TO, READ BACK BY AND VERIFIED WITH: M. Bitonti Pharm.D. 11:30 07/28/16 ([email or web address])    Culture METHICILLIN RESISTANT STAPHYLOCOCCUS AUREUS (A)  Final   Report Status 07/30/2016 FINAL  Final   Organism ID, Bacteria METHICILLIN RESISTANT STAPHYLOCOCCUS AUREUS  Final       Susceptibility   Methicillin resistant staphylococcus aureus - MIC*    CIPROFLOXACIN >=8 RESISTANT Resistant     ERYTHROMYCIN <=0.25 SENSITIVE Sensitive     GENTAMICIN <=0.5 SENSITIVE Sensitive     OXACILLIN >=4 RESISTANT Resistant     TETRACYCLINE <=1 SENSITIVE Sensitive     VANCOMYCIN <=0.5 SENSITIVE Sensitive     TRIMETH/SULFA <=10 SENSITIVE Sensitive     CLINDAMYCIN <=0.25 SENSITIVE Sensitive     RIFAMPIN <=0.5 SENSITIVE Sensitive     Inducible Clindamycin NEGATIVE Sensitive     * METHICILLIN RESISTANT STAPHYLOCOCCUS AUREUS  Culture, blood (Routine X 2) w Reflex to ID Panel     Status: None (Preliminary result)   Collection Time: 07/27/16 12:23 PM  Result Value Ref Range Status   Specimen Description BLOOD RIGHT ARM  Final   Special Requests BOTTLES DRAWN AEROBIC AND ANAEROBIC 5 CC  Final   Culture NO GROWTH 4 DAYS  Final   Report Status PENDING  Incomplete  Blood Culture ID Panel (Reflexed)     Status: Abnormal   Collection Time: 07/27/16 12:23 PM  Result Value Ref Range Status   Enterococcus species NOT DETECTED NOT DETECTED Final   Listeria monocytogenes NOT DETECTED NOT DETECTED Final   Staphylococcus species DETECTED (A) NOT DETECTED Final    Comment: CRITICAL RESULT CALLED TO, READ BACK BY AND VERIFIED WITH: M. Bitonti Pharm.D. 11:30 07/28/16 ([email or web address])    Staphylococcus aureus DETECTED (A) NOT DETECTED Final    Comment: CRITICAL RESULT CALLED TO, READ BACK BY AND VERIFIED WITH: M.  Bitonti Pharm.D. 11:30 07/28/16  ([email or web address])    Methicillin resistance DETECTED (A) NOT DETECTED Final    Comment: CRITICAL RESULT CALLED TO, READ BACK BY AND VERIFIED WITH: M. Bitonti Pharm.D. 11:30 07/28/16 ([email or web address])    Streptococcus species NOT DETECTED NOT DETECTED Final   Streptococcus agalactiae NOT DETECTED NOT DETECTED Final   Streptococcus pneumoniae NOT DETECTED NOT DETECTED Final   Streptococcus pyogenes NOT DETECTED NOT DETECTED Final   Acinetobacter baumannii NOT DETECTED NOT  DETECTED Final   Enterobacteriaceae species NOT DETECTED NOT DETECTED Final   Enterobacter cloacae complex NOT DETECTED NOT DETECTED Final   Escherichia coli NOT DETECTED NOT DETECTED Final   Klebsiella oxytoca NOT DETECTED NOT DETECTED Final   Klebsiella pneumoniae NOT DETECTED NOT DETECTED Final   Proteus species NOT DETECTED NOT DETECTED Final   Serratia marcescens NOT DETECTED NOT DETECTED Final   Haemophilus influenzae NOT DETECTED NOT DETECTED Final   Neisseria meningitidis NOT DETECTED NOT DETECTED Final   Pseudomonas aeruginosa NOT DETECTED NOT DETECTED Final   Candida albicans NOT DETECTED NOT DETECTED Final   Candida glabrata NOT DETECTED NOT DETECTED Final   Candida krusei NOT DETECTED NOT DETECTED Final   Candida parapsilosis NOT DETECTED NOT DETECTED Final   Candida tropicalis NOT DETECTED NOT DETECTED Final  Culture, blood (routine x 2)     Status: None   Collection Time: 07/28/16  6:16 PM  Result Value Ref Range Status   Specimen Description BLOOD LEFT ANTECUBITAL  Final   Special Requests BOTTLES DRAWN AEROBIC AND ANAEROBIC 10CC EA  Final   Culture NO GROWTH 5 DAYS  Final   Report Status 08/02/2016 FINAL  Final  Culture, blood (routine x 2)     Status: Abnormal   Collection Time: 07/28/16  6:24 PM  Result Value Ref Range Status   Specimen Description BLOOD RIGHT ANTECUBITAL  Final   Special Requests IN PEDIATRIC BOTTLE 1.5CC  Final   Culture  Setup Time   Final    GRAM POSITIVE COCCI IN CLUSTERS AEROBIC BOTTLE ONLY CRITICAL RESULT CALLED TO, READ BACK BY AND VERIFIED WITH: A MYER 07/29/16 @ 1841 M VESTAL    Culture (A)  Final    STAPHYLOCOCCUS AUREUS SUSCEPTIBILITIES PERFORMED ON PREVIOUS CULTURE WITHIN THE LAST 5 DAYS.    Report Status 07/31/2016 FINAL  Final  Culture, Urine     Status: None (Preliminary result)   Collection Time: 07/31/16  7:00 PM  Result Value Ref Range Status   Specimen Description URINE, CLEAN CATCH  Final   Special Requests NONE  Final     Culture CULTURE REINCUBATED FOR BETTER GROWTH  Final   Report Status PENDING  Incomplete     ASSESSMENT: On his last blood cultures on 07/28/2016 he, again, had one of 2 sets growing MRSA. I will continue IV vancomycin and repeat blood cultures today. He needs 5 more days of cefepime for possible Enterobacter (and MRSA) HCAP. He also needs to stay on oral vancomycin for C. difficile colitis.  PLAN: 1. Continue current antibiotics 2. Repeat blood cultures  Cliffton Asters, MD [facility] [phone] pager   [phone] cell 08/03/2016, 10:48 AM

## 2016-08-04 DIAGNOSIS — F1910 Other psychoactive substance abuse, uncomplicated: Secondary | ICD-10-CM

## 2016-08-04 DIAGNOSIS — Z720 Tobacco use: Secondary | ICD-10-CM

## 2016-08-04 DIAGNOSIS — R400 Somnolence: Secondary | ICD-10-CM

## 2016-08-04 DIAGNOSIS — B3741 Candidal cystitis and urethritis: Secondary | ICD-10-CM

## 2016-08-04 DIAGNOSIS — F17210 Nicotine dependence, cigarettes, uncomplicated: Secondary | ICD-10-CM

## 2016-08-04 DIAGNOSIS — Z8661 Personal history of infections of the central nervous system: Secondary | ICD-10-CM

## 2016-08-04 DIAGNOSIS — Z8673 Personal history of transient ischemic attack (TIA), and cerebral infarction without residual deficits: Secondary | ICD-10-CM

## 2016-08-04 DIAGNOSIS — E440 Moderate protein-calorie malnutrition: Secondary | ICD-10-CM

## 2016-08-04 DIAGNOSIS — Y95 Nosocomial condition: Secondary | ICD-10-CM

## 2016-08-04 DIAGNOSIS — J189 Pneumonia, unspecified organism: Secondary | ICD-10-CM

## 2016-08-04 DIAGNOSIS — I6330 Cerebral infarction due to thrombosis of unspecified cerebral artery: Secondary | ICD-10-CM

## 2016-08-04 LAB — COMPREHENSIVE METABOLIC PANEL
ALT: 12 U/L — AB (ref 17–63)
AST: 14 U/L — AB (ref 15–41)
Albumin: 2.1 g/dL — ABNORMAL LOW (ref 3.5–5.0)
Alkaline Phosphatase: 51 U/L (ref 38–126)
Anion gap: 8 (ref 5–15)
BUN: 14 mg/dL (ref 6–20)
CHLORIDE: 100 mmol/L — AB (ref 101–111)
CO2: 36 mmol/L — AB (ref 22–32)
CREATININE: 0.63 mg/dL (ref 0.61–1.24)
Calcium: 8.7 mg/dL — ABNORMAL LOW (ref 8.9–10.3)
GFR calc Af Amer: >60 mL/min (ref >60)
GLUCOSE: 97 mg/dL (ref 65–99)
Potassium: 3 mmol/L — ABNORMAL LOW (ref 3.5–5.1)
Sodium: 144 mmol/L (ref 135–145)
Total Bilirubin: 0.3 mg/dL (ref 0.3–1.2)
Total Protein: 5.5 g/dL — ABNORMAL LOW (ref 6.5–8.1)

## 2016-08-04 LAB — CBC
HCT: 31.2 % — ABNORMAL LOW (ref 39.0–52.0)
Hemoglobin: 10.1 g/dL — ABNORMAL LOW (ref 13.0–17.0)
MCH: 28.9 pg (ref 26.0–34.0)
MCHC: 32.4 g/dL (ref 30.0–36.0)
MCV: 89.1 fL (ref 78.0–100.0)
PLATELETS: 308 10*3/uL (ref 150–400)
RBC: 3.5 MIL/uL — ABNORMAL LOW (ref 4.22–5.81)
RDW: 17.4 % — AB (ref 11.5–15.5)
WBC: 9.7 10*3/uL (ref 4.0–10.5)

## 2016-08-04 LAB — GLUCOSE, CAPILLARY
GLUCOSE-CAPILLARY: 94 mg/dL (ref 65–99)
Glucose-Capillary: 105 mg/dL — ABNORMAL HIGH (ref 65–99)
Glucose-Capillary: 97 mg/dL (ref 65–99)
Glucose-Capillary: 111 mg/dL — ABNORMAL HIGH (ref 65–99)
Glucose-Capillary: 80 mg/dL (ref 65–99)
Glucose-Capillary: 95 mg/dL (ref 65–99)

## 2016-08-04 LAB — VANCOMYCIN, TROUGH: VANCOMYCIN TR: 18 ug/mL (ref 15–20)

## 2016-08-04 MED ORDER — POTASSIUM CHLORIDE 10 MEQ/100ML IV SOLN
INTRAVENOUS | Status: AC
Start: 2016-08-04 — End: 2016-08-04
  Administered 2016-08-04: 10 meq
  Filled 2016-08-04: qty 100

## 2016-08-04 MED ORDER — QUETIAPINE FUMARATE 50 MG PO TABS
75.0000 mg | ORAL_TABLET | Freq: Two times a day (BID) | ORAL | Status: DC
  Administered 2016-08-04 – 2016-08-06 (×4): 75 mg
  Filled 2016-08-04 (×4): qty 1

## 2016-08-04 MED ORDER — POTASSIUM CHLORIDE 10 MEQ/100ML IV SOLN
10.0000 meq | INTRAVENOUS | Status: AC
  Administered 2016-08-04 (×4): 10 meq via INTRAVENOUS
  Filled 2016-08-04 (×4): qty 100

## 2016-08-04 NOTE — Progress Notes (Signed)
[facility]- per MD possibly stable tomorrow after trach is changed to cuffless.  CSW will continue to follow  Burna SisJenna H. Mary-Anne Polizzi, [facility] Clinical Social Worker [phone]

## 2016-08-04 NOTE — Progress Notes (Signed)
ATC setup changed 

## 2016-08-04 NOTE — Plan of Care (Signed)
Problem: Education: Goal: Knowledge of [facility] Education information/materials will improve Outcome: Not Progressing Patient is does not exhibit any signs of comprehension.

## 2016-08-04 NOTE — Progress Notes (Signed)
PROGRESS NOTE    Dale [name]  ZOX:[id number] DOB: 02-13-1966 DOA: 07/12/2016 PCP: No primary care provider on file.   Brief Narrative:  50 year old BM presented from [facility] PMHx of multiple admissions since April 2017. Depression, Dementia (04/27/2016); Convulsions/seizures (HCC) (05/04/2016;Meningoencephalitis, Meningitis, streptococcal (02/16/2016),Cerebral thrombosis with cerebral infarction (03/25/2016); Cerebral septic emboli (HCC, NSTEMI, Cardiac arrest (HCC); Cardiogenic shock (HCC); Chronic Systolic and diastolic CHF, Acute respiratory failure with hypoxia (HCC); ; Trichomonal urethritis in male (02/16/2016); Tobacco abuse (02/16/2016); Substance abuse; ETOH abuse;  Admitted again 8/18 with altered mental status, inability to protect airway.  He was hypotensive and hypothermic from UTI. Acute resp failure and severe delirium. S/p Trach on 8/18.    Subjective: 8/29 unresponsive to painful stimuli.   Assessment & Plan:   Active Problems:   Homeless   History of bacterial meningitis   Tobacco abuse   ETOH abuse   Substance abuse   Cardiogenic shock (HCC)   Cardiac arrest (HCC)   Acute on chronic respiratory failure with hypoxia (HCC)   Tracheostomy status (HCC)   NSTEMI (non-ST elevated myocardial infarction) (HCC)   Chronic combined systolic and diastolic CHF (congestive heart failure) (HCC)   Cerebral thrombosis with cerebral infarction   Moderate protein-calorie malnutrition (HCC)   HLD (hyperlipidemia)   Dysphagia   Seizures (HCC)   Adrenal insufficiency (Addison's disease) (HCC)   Encephalopathy   Arterial hypotension   New onset a-fib (HCC)   MRSA bacteremia   C. difficile colitis   Aspiration pneumonia (HCC)   Syphilis   Pressure ulcer   HCAP (healthcare-associated pneumonia)   Acute/subacute stroke/ Acute encephalopathy. -8/29 MRI pending; possible extension of previous stroke see results below -Allow permissive HTN  Agitation -Haldol WJX:BJYN  for HR<50 or if QT becomes prolonged  Altered mental status -Patient unresponsive this A.m. pupils sluggish responsive to light, unresponsive to painful stimuli MRI new stroke?  Acute on chronic hypoxic respiratory failure  - due to inability to protect airway. -Hx tracheostomy March 2017 ( s/p decannulation.) -Aspiration PNA 8/23 event -On trach collar  Seizures, strep pneumo meningitis, CVA. Continue depakote, keppra  Decreased Seroquel to 100 mg BID 8/20  Septic shock  -Multifactorial from UTI, HCAP, C diff colitis  IMDR gram neg UTI HCAP with Enterobacter >> MDR. C diff colitis. New aspiration PNA 8/23 GPC clusters in blood- contam 8/23 likely 1/2 Pan culture. Unasyn per pharmacy for likely aspiration PNA,  IV vanc  - resolved.   Chronic combined systolic and diastolic CHF, HLD. -Patient was on Neo-Synephrine overnight to maintain goal MAP> 55. Now off.  -Continue florinef. -Hydrocort keep at 50 q6h until BP stablizes -Strict in and out's admission + 4.5 L -Daily weight. Filed Weights   08/02/16 0309 08/03/16 0500 08/04/16 0500  Weight: 63.7 kg (140 lb 6.9 oz) 60.8 kg (134 lb 0.6 oz) 60.6 kg (133 lb 9.6 oz)  -Transfuse for hemoglobin<8 -8/26 Transfuse 1 unit PRBC  Hypernatremia  -resolved  Hypokalemia@@@ -Potassium goal> 4 -Potassium IV 50 mEq  Protein calorie malnutrition.  Anemia of chronic disease and critical illness.  Chronic adrenal insufficiency.        Goals of care -8/26 PALLIATIVE CARE consult placed:Patient has been hospitalized almost consistently since April 2017 with short periods of time in between hospitalizations at [facility]. Has continued to decline. Address and obtain answer on CODE STATUS, palliative care vs hospice vs no return to hospital     DVT prophylaxis: Subcutaneous heparin Code Status: Full Family Communication:  Disposition Plan: Per palliative care   Consultants:  Palliative care    Procedures/Significant  Events:  CT head 8/8 >> old b/l infarcts with subacute infarct again identified at inferior left caudate.  No acute process. 8/08 admitted with unresponsiveness, required intubation. 8/09 low cvp, levophed, possible seizure  8/10 not moving rt ext upper and lowers EEG 8/10 >>suppressed backround MRI brain 8/11 >> multifocal acute Lt MCA ischemic infarcts, possible infarct Rt caudate head MRI brain 8/29:-Moderate-sized confluent left MCA territory infarct, increased in size relative to prior MRI from 07/15/2016. - largely subacute in appearance.    Cultures 8/08 Urine >> Enterobacter 8/08 Blood >> NEG 8/09 Sputum >> MDR Enterobacter 8/09 Blood >> NEG 8/12 C diff >> antigen and toxin positive 8/23 Blood>>> 1/2 Biofire= MRSa vs MRSE>>> 8/23 Urine>>> 8/23 Sputum>>> GNR >>    Antimicrobials: Vanc 8/8 >>8/10 Zosyn 8/8 >>8/11 meropenem 8/11>> 8/18 Oral vancomycin 8/12 >> Unasyn 8/23>>> 8/27 vanc IV 8/24>>>  Cefepime 8/27>>   Devices    LINES / TUBES:  ETT 8/8 > 8/18 Trach (JY) 8/18 >>  R IJ CVL 8/8 >>>8/25    Continuous Infusions: . sodium chloride 10 mL/hr at 08/03/16 1219  . feeding supplement (VITAL AF 1.2 CAL) 1,000 mL (08/04/16 0251)     Objective: Vitals:   08/04/16 0500 08/04/16 0600 08/04/16 0727 08/04/16 0743  BP: (!) 141/76 131/74 102/68 102/68  Pulse: (!) 51 (!) 59 (!) 58 60  Resp: 13 18 12 15   Temp:    97.5 F (36.4 C)  TempSrc:    Oral  SpO2: 100% 99% 93% 95%  Weight: 60.6 kg (133 lb 9.6 oz)     Height:        Intake/Output Summary (Last 24 hours) at 08/04/16 0758 Last data filed at 08/04/16 0600  Gross per 24 hour  Intake          2312.67 ml  Output             2150 ml  Net           162.67 ml   Filed Weights   08/02/16 0309 08/03/16 0500 08/04/16 0500  Weight: 63.7 kg (140 lb 6.9 oz) 60.8 kg (134 lb 0.6 oz) 60.6 kg (133 lb 9.6 oz)    Examination:  General: Unresponsive to painful stimuli, Eyes: pupils dilated sluggishly responsive  to light ENT: Negative Runny nose, negative gingival bleeding, Neck:  Negative scars, masses, torticollis, lymphadenopathy, JVD, right IJ in place negative sign of infection Lungs: Clear to auscultation bilaterally without wheezes or crackles Cardiovascular: Regular rate and rhythm without murmur gallop or rub normal S1 and S2 Abdomen: negative abdominal pain, nondistended, positive soft, bowel sounds, no rebound, no ascites, no appreciable mass Extremities: No significant cyanosis, clubbing, or edema bilateral lower extremities Skin: Negative rashes, lesions, ulcers Psychiatric:  Negative depression, negative anxiety, negative fatigue, negative mania  Central nervous system: Unresponsive .     Data Reviewed: Care during the described time interval was provided by me .  I have reviewed this patient's available data, including medical history, events of note, physical examination, and all test results as part of my evaluation. I have personally reviewed and interpreted all radiology studies.  CBC:  Recent Labs Lab 07/30/16 0249 07/31/16 0250 08/01/16 0119 08/02/16 0303 08/04/16 0451  WBC 11.3* 7.8 7.9 6.3 9.7  HGB 7.8* 9.1* 9.1* 10.3* 10.1*  HCT 24.5* 28.5* 28.0* 31.0* 31.2*  MCV 90.7 89.1 87.2 88.3 89.1  PLT 303 309  266 237 308   Basic Metabolic Panel:  Recent Labs Lab 07/30/16 0249 07/31/16 0250 07/31/16 1337 08/01/16 0119 08/02/16 0303 08/04/16 0451  NA 145 146*  --  143 142 144  K 3.4* 2.8* 4.0 2.9* 3.6 3.0*  CL 112* 110  --  107 103 100*  CO2 22 25  --  26 29 36*  GLUCOSE 112* 96  --  119* 112* 97  BUN 16 18  --  16 17 14   CREATININE 0.68 0.67  --  0.61 0.58* 0.63  CALCIUM 8.7* 8.6*  --  8.6* 8.5* 8.7*  MG  --   --  1.9 1.7 2.3  --    GFR: Estimated Creatinine Clearance: 93.6 mL/min (by C-G formula based on SCr of 0.8 mg/dL). Liver Function Tests:  Recent Labs Lab 08/02/16 0303 08/04/16 0451  AST 17 14*  ALT 13* 12*  ALKPHOS 47 51  BILITOT 0.3 0.3    PROT 5.2* 5.5*  ALBUMIN 2.0* 2.1*   No results for input(s): LIPASE, AMYLASE in the last 168 hours. No results for input(s): AMMONIA in the last 168 hours. Coagulation Profile:  Recent Labs Lab 07/29/16 0415  INR 1.18   Cardiac Enzymes: No results for input(s): CKTOTAL, CKMB, CKMBINDEX, TROPONINI in the last 168 hours. BNP (last 3 results) No results for input(s): PROBNP in the last 8760 hours. HbA1C: No results for input(s): HGBA1C in the last 72 hours. CBG:  Recent Labs Lab 08/03/16 1525 08/03/16 2029 08/03/16 2328 08/04/16 0314 08/04/16 0742  GLUCAP 97 86 119* 105* 97   Lipid Profile: No results for input(s): CHOL, HDL, LDLCALC, TRIG, CHOLHDL, LDLDIRECT in the last 72 hours. Thyroid Function Tests: No results for input(s): TSH, T4TOTAL, FREET4, T3FREE, THYROIDAB in the last 72 hours. Anemia Panel: No results for input(s): VITAMINB12, FOLATE, FERRITIN, TIBC, IRON, RETICCTPCT in the last 72 hours. Urine analysis:    Component Value Date/Time   COLORURINE YELLOW 07/27/2016 1115   APPEARANCEUR CLOUDY (A) 07/27/2016 1115   LABSPEC 1.016 07/27/2016 1115   PHURINE 6.0 07/27/2016 1115   GLUCOSEU NEGATIVE 07/27/2016 1115   HGBUR NEGATIVE 07/27/2016 1115   BILIRUBINUR NEGATIVE 07/27/2016 1115   KETONESUR NEGATIVE 07/27/2016 1115   PROTEINUR NEGATIVE 07/27/2016 1115   UROBILINOGEN 1.0 11/13/2011 2010   NITRITE NEGATIVE 07/27/2016 1115   LEUKOCYTESUR TRACE (A) 07/27/2016 1115   Sepsis Labs: @LABRCNTIP (procalcitonin:4,lacticidven:4)  ) Recent Results (from the past 240 hour(s))  Urine culture     Status: Abnormal   Collection Time: 07/27/16 11:15 AM  Result Value Ref Range Status   Specimen Description URINE, RANDOM  Final   Special Requests NONE  Final   Culture (A)  Final    >=100,000 COLONIES/mL ENTEROCOCCUS FAECIUM VANCOMYCIN RESISTANT ENTEROCOCCUS    Report Status 07/30/2016 FINAL  Final   Organism ID, Bacteria ENTEROCOCCUS FAECIUM (A)  Final       Susceptibility   Enterococcus faecium - MIC*    AMPICILLIN >=32 RESISTANT Resistant     LEVOFLOXACIN >=8 RESISTANT Resistant     NITROFURANTOIN 128 RESISTANT Resistant     VANCOMYCIN >=32 RESISTANT Resistant     LINEZOLID 2 SENSITIVE Sensitive     * >=100,000 COLONIES/mL ENTEROCOCCUS FAECIUM  Culture, respiratory (NON-Expectorated)     Status: None   Collection Time: 07/27/16 11:15 AM  Result Value Ref Range Status   Specimen Description TRACHEAL ASPIRATE  Final   Special Requests NONE  Final   Gram Stain   Final    MODERATE WBC  PRESENT, PREDOMINANTLY PMN MODERATE GRAM POSITIVE COCCI IN PAIRS IN CLUSTERS FEW GRAM POSITIVE RODS RARE GRAM NEGATIVE RODS    Culture   Final    ABUNDANT METHICILLIN RESISTANT STAPHYLOCOCCUS AUREUS ABUNDANT ENTEROBACTER SPECIES    Report Status 07/30/2016 FINAL  Final   Organism ID, Bacteria METHICILLIN RESISTANT STAPHYLOCOCCUS AUREUS  Final   Organism ID, Bacteria ENTEROBACTER SPECIES  Final      Susceptibility   Enterobacter species - MIC*    CEFAZOLIN >=64 RESISTANT Resistant     CEFEPIME 4 SENSITIVE Sensitive     CEFTAZIDIME >=64 RESISTANT Resistant     CEFTRIAXONE >=64 RESISTANT Resistant     CIPROFLOXACIN 1 SENSITIVE Sensitive     GENTAMICIN >=16 RESISTANT Resistant     IMIPENEM 1 SENSITIVE Sensitive     TRIMETH/SULFA <=20 SENSITIVE Sensitive     PIP/TAZO >=128 RESISTANT Resistant     * ABUNDANT ENTEROBACTER SPECIES   Methicillin resistant staphylococcus aureus - MIC*    CIPROFLOXACIN >=8 RESISTANT Resistant     ERYTHROMYCIN <=0.25 SENSITIVE Sensitive     GENTAMICIN <=0.5 SENSITIVE Sensitive     OXACILLIN >=4 RESISTANT Resistant     TETRACYCLINE <=1 SENSITIVE Sensitive     VANCOMYCIN <=0.5 SENSITIVE Sensitive     TRIMETH/SULFA <=10 SENSITIVE Sensitive     CLINDAMYCIN <=0.25 SENSITIVE Sensitive     RIFAMPIN <=0.5 SENSITIVE Sensitive     Inducible Clindamycin NEGATIVE Sensitive     * ABUNDANT METHICILLIN RESISTANT STAPHYLOCOCCUS AUREUS    Culture, blood (Routine X 2) w Reflex to ID Panel     Status: Abnormal   Collection Time: 07/27/16 12:23 PM  Result Value Ref Range Status   Specimen Description BLOOD RIGHT ARM  Final   Special Requests BOTTLES DRAWN AEROBIC AND ANAEROBIC 5 CC  Final   Culture  Setup Time   Final    GRAM POSITIVE COCCI IN CLUSTERS AEROBIC BOTTLE ONLY CRITICAL RESULT CALLED TO, READ BACK BY AND VERIFIED WITH: M. Bitonti Pharm.D. 11:30 07/28/16 ([email or web address])    Culture METHICILLIN RESISTANT STAPHYLOCOCCUS AUREUS (A)  Final   Report Status 07/30/2016 FINAL  Final   Organism ID, Bacteria METHICILLIN RESISTANT STAPHYLOCOCCUS AUREUS  Final      Susceptibility   Methicillin resistant staphylococcus aureus - MIC*    CIPROFLOXACIN >=8 RESISTANT Resistant     ERYTHROMYCIN <=0.25 SENSITIVE Sensitive     GENTAMICIN <=0.5 SENSITIVE Sensitive     OXACILLIN >=4 RESISTANT Resistant     TETRACYCLINE <=1 SENSITIVE Sensitive     VANCOMYCIN <=0.5 SENSITIVE Sensitive     TRIMETH/SULFA <=10 SENSITIVE Sensitive     CLINDAMYCIN <=0.25 SENSITIVE Sensitive     RIFAMPIN <=0.5 SENSITIVE Sensitive     Inducible Clindamycin NEGATIVE Sensitive     * METHICILLIN RESISTANT STAPHYLOCOCCUS AUREUS  Culture, blood (Routine X 2) w Reflex to ID Panel     Status: None   Collection Time: 07/27/16 12:23 PM  Result Value Ref Range Status   Specimen Description BLOOD RIGHT ARM  Final   Special Requests BOTTLES DRAWN AEROBIC AND ANAEROBIC 5 CC  Final   Culture NO GROWTH 7 DAYS  Final   Report Status 08/03/2016 FINAL  Final  Blood Culture ID Panel (Reflexed)     Status: Abnormal   Collection Time: 07/27/16 12:23 PM  Result Value Ref Range Status   Enterococcus species NOT DETECTED NOT DETECTED Final   Listeria monocytogenes NOT DETECTED NOT DETECTED Final   Staphylococcus species DETECTED (A) NOT DETECTED Final  Comment: CRITICAL RESULT CALLED TO, READ BACK BY AND VERIFIED WITH: M. Bitonti Pharm.D. 11:30 07/28/16 ([email or web address])     Staphylococcus aureus DETECTED (A) NOT DETECTED Final    Comment: CRITICAL RESULT CALLED TO, READ BACK BY AND VERIFIED WITH: M. Bitonti Pharm.D. 11:30 07/28/16  ([email or web address])    Methicillin resistance DETECTED (A) NOT DETECTED Final    Comment: CRITICAL RESULT CALLED TO, READ BACK BY AND VERIFIED WITH: M. Bitonti Pharm.D. 11:30 07/28/16 (wilsonm)    Streptococcus species NOT DETECTED NOT DETECTED Final   Streptococcus agalactiae NOT DETECTED NOT DETECTED Final   Streptococcus pneumoniae NOT DETECTED NOT DETECTED Final   Streptococcus pyogenes NOT DETECTED NOT DETECTED Final   Acinetobacter baumannii NOT DETECTED NOT DETECTED Final   Enterobacteriaceae species NOT DETECTED NOT DETECTED Final   Enterobacter cloacae complex NOT DETECTED NOT DETECTED Final   Escherichia coli NOT DETECTED NOT DETECTED Final   Klebsiella oxytoca NOT DETECTED NOT DETECTED Final   Klebsiella pneumoniae NOT DETECTED NOT DETECTED Final   Proteus species NOT DETECTED NOT DETECTED Final   Serratia marcescens NOT DETECTED NOT DETECTED Final   Haemophilus influenzae NOT DETECTED NOT DETECTED Final   Neisseria meningitidis NOT DETECTED NOT DETECTED Final   Pseudomonas aeruginosa NOT DETECTED NOT DETECTED Final   Candida albicans NOT DETECTED NOT DETECTED Final   Candida glabrata NOT DETECTED NOT DETECTED Final   Candida krusei NOT DETECTED NOT DETECTED Final   Candida parapsilosis NOT DETECTED NOT DETECTED Final   Candida tropicalis NOT DETECTED NOT DETECTED Final  Culture, blood (routine x 2)     Status: None   Collection Time: 07/28/16  6:16 PM  Result Value Ref Range Status   Specimen Description BLOOD LEFT ANTECUBITAL  Final   Special Requests BOTTLES DRAWN AEROBIC AND ANAEROBIC 10CC EA  Final   Culture NO GROWTH 5 DAYS  Final   Report Status 08/02/2016 FINAL  Final  Culture, blood (routine x 2)     Status: Abnormal   Collection Time: 07/28/16  6:24 PM  Result Value Ref Range Status   Specimen Description BLOOD  RIGHT ANTECUBITAL  Final   Special Requests IN PEDIATRIC BOTTLE 1.5CC  Final   Culture  Setup Time   Final    GRAM POSITIVE COCCI IN CLUSTERS AEROBIC BOTTLE ONLY CRITICAL RESULT CALLED TO, READ BACK BY AND VERIFIED WITH: A MYER 07/29/16 @ 1841 M VESTAL    Culture (A)  Final    STAPHYLOCOCCUS AUREUS SUSCEPTIBILITIES PERFORMED ON PREVIOUS CULTURE WITHIN THE LAST 5 DAYS.    Report Status 07/31/2016 FINAL  Final  Culture, Urine     Status: Abnormal   Collection Time: 07/31/16  7:00 PM  Result Value Ref Range Status   Specimen Description URINE, CLEAN CATCH  Final   Special Requests NONE  Final   Culture >=100,000 COLONIES/mL YEAST (A)  Final   Report Status 08/03/2016 FINAL  Final  Culture, blood (routine x 2)     Status: None (Preliminary result)   Collection Time: 08/03/16 12:38 PM  Result Value Ref Range Status   Specimen Description BLOOD LEFT ARM  Final   Special Requests IN PEDIATRIC BOTTLE 2.5CC  Final   Culture NO GROWTH <12 HOURS  Final   Report Status PENDING  Incomplete         Radiology Studies: Mr Brain 39 Contrast  Result Date: 08/02/2016 CLINICAL DATA:  Initial evaluation for decreased responsiveness, status post recent stroke. Question new or worsened stroke. EXAM: MRI HEAD WITHOUT CONTRAST TECHNIQUE: Multiplanar,  multiecho pulse sequences of the brain and surrounding structures were obtained without intravenous contrast. COMPARISON:  Previous brain MRI from 07/15/2016. FINDINGS: Limited DWI sequences were performed. Additionally, study is somewhat degraded by motion artifact. Axial DWI sequence demonstrates a moderate-size confluent diffusion abnormality involving the left MCA territory, increased in size relative to most recent MRI from 07/15/2016. Area of infarction involves the left frontal lobe, including the operculum and insula, with extension towards the the left temporal lobe. This is largely subacute in appearance with normalization of associated ADC signal.  The No significant mass effect. Evaluation for associated hemorrhage limited on this exam. No other acute intracranial infarct. No other definite acute intracranial process on this limited study. Remote watershed type infarcts involving the right cerebral hemisphere again noted. Remote lacunar infarcts involving the left basal ganglia noted. Chronic microvascular ischemic disease grossly stable. No hydrocephalus or midline shift. No extra-axial fluid collection. IMPRESSION: 1. Moderate-sized confluent left MCA territory infarct, increased in size relative to prior MRI from 07/15/2016. This is largely subacute in appearance. Mild localized edema without significant mass effect. The 2. No other definite acute intracranial process on this limited study. Electronically Signed   By: Rise MuBenjamin  McClintock M.D.   On: 08/02/2016 16:50        Scheduled Meds: . aspirin  325 mg Per Tube Daily  . atorvastatin  20 mg Per Tube q1800  . ceFEPime (MAXIPIME) IV  2 g Intravenous Q8H  . chlorhexidine  15 mL Mouth Rinse BID  . famotidine  20 mg Per Tube BID  . fludrocortisone  0.1 mg Per Tube Daily  . folic acid  1 mg Per Tube Daily  . heparin subcutaneous  5,000 Units Subcutaneous Q8H  . hydrocortisone  50 mg Per Tube Q6H  . insulin aspart  0-20 Units Subcutaneous Q4H  . levETIRAcetam  1,500 mg Per Tube BID  . mouth rinse  15 mL Mouth Rinse q12n4p  . QUEtiapine  100 mg Per Tube BID  . thiamine  100 mg Per Tube Daily  . Valproate Sodium  750 mg Per Tube Q8H  . vancomycin  500 mg Per Tube Q6H  . vancomycin  1,000 mg Intravenous Q12H   Continuous Infusions: . sodium chloride 10 mL/hr at 08/03/16 1219  . feeding supplement (VITAL AF 1.2 CAL) 1,000 mL (08/04/16 0251)     LOS: 23 days    Time spent: 40 minutes    Kerrie Latour, Roselind MessierURTIS J, MD [facility] Pager [phone]   If 7PM-7AM, please contact night-coverage [email or web address] Password [email or web address] 08/04/2016, 7:58 AM

## 2016-08-04 NOTE — Progress Notes (Signed)
PROGRESS NOTE    Dale [name]  ZOX:[id number] DOB: [birth year] DOA: 07/12/2016 PCP: No primary care provider on file.   Brief Narrative:  50 year old BM presented from [facility] PMHx of multiple admissions since April 2017. Depression, Dementia (04/27/2016); Convulsions/seizures (HCC) (05/04/2016;Meningoencephalitis, Meningitis, streptococcal (02/16/2016),Cerebral thrombosis with cerebral infarction (03/25/2016); Cerebral septic emboli (HCC, NSTEMI, Cardiac arrest (HCC); Cardiogenic shock (HCC); Chronic Systolic and diastolic CHF, Acute respiratory failure with hypoxia (HCC); ; Trichomonal urethritis in male (02/16/2016); Tobacco abuse (02/16/2016); Substance abuse; ETOH abuse;  Admitted again 8/18 with altered mental status, inability to protect airway.  He was hypotensive and hypothermic from UTI. Acute resp failure and severe delirium. S/p Trach on 8/18.    Subjective: 8/31 will open eyes to painful stimuli. Poor withdraw from painful stimuli. Does not follow commands.   Assessment & Plan:   Active Problems:   Homeless   History of bacterial meningitis   Tobacco abuse   ETOH abuse   Substance abuse   Cardiogenic shock (HCC)   Cardiac arrest (HCC)   Acute on chronic respiratory failure with hypoxia (HCC)   Tracheostomy status (HCC)   NSTEMI (non-ST elevated myocardial infarction) (HCC)   Chronic combined systolic and diastolic CHF (congestive heart failure) (HCC)   Cerebral thrombosis with cerebral infarction   Moderate protein-calorie malnutrition (HCC)   HLD (hyperlipidemia)   Dysphagia   Seizures (HCC)   Adrenal insufficiency (Addison's disease) (HCC)   Encephalopathy   Arterial hypotension   New onset a-fib (HCC)   MRSA bacteremia   C. difficile colitis   Aspiration pneumonia (HCC)   Syphilis   Pressure ulcer   HCAP (healthcare-associated pneumonia)   Acute/subacute stroke/ Acute Encephalopathy. -8/29 MRI pending; possible extension of previous stroke see  results below -Allow permissive HTN -CVA/subacute CVA most likely cause of patient's continued encephalopathy. Discontinue fentanyl and Ativan. Decrease Seroquel. Would be very judicious in the use of sedating medication.  Agitation -Haldol WJX:BJYN for HR<50 or if QT becomes prolonged  Altered mental status -Eyes open but minimally responsive to painful stimuli.  -See acute encephalopathy   Acute on chronic hypoxic respiratory failure/ Enterobacter & MRSA HCAP - due to inability to protect airway. -Hx tracheostomy March 2017 ( s/p decannulation.) -Aspiration PNA 8/23 event -On trach collar -Given patient's poor mental status will hold on placing #6 cuffless trach.  Seizures, strep pneumo meningitis, CVA. -Continue depakote, keppra  -Decreased Seroquel to 75 mg BID 8/31  Septic shock  -Multifactorial from UTI, HCAP, C diff colitis  - resolved.   C diff colitis -Cont enteral vanc   Chronic combined systolic and diastolic CHF, HLD. -Patient was on Neo-Synephrine overnight to maintain goal MAP> 55. Now off.  -Continue florinef. -Hydrocort keep at 50 q6h until BP stablizes -Strict in and out's admission + 5.5 L -Daily weight. Filed Weights   08/02/16 0309 08/03/16 0500 08/04/16 0500  Weight: 63.7 kg (140 lb 6.9 oz) 60.8 kg (134 lb 0.6 oz) 60.6 kg (133 lb 9.6 oz)  -Transfuse for hemoglobin<8 -8/26 Transfuse 1 unit PRBC  Hypernatremia  -resolved  Hypokalemia -Potassium goal> 4 -Potassium IV 50 mEq  Protein calorie malnutrition.  Anemia of chronic disease and critical illness.  Chronic adrenal insufficiency.  -See CHF      Goals of care -8/26 PALLIATIVE CARE consult placed:Patient has been hospitalized almost consistently since April 2017 with short periods of time in between hospitalizations at [facility]. Has continued to decline. Address and obtain answer on CODE STATUS, palliative care  vs hospice vs no return to hospital     DVT prophylaxis: Subcutaneous  heparin Code Status: Full Family Communication:  Disposition Plan: Per palliative care   Consultants:  Palliative care    Procedures/Significant Events:  CT head 8/8 >> old b/l infarcts with subacute infarct again identified at inferior left caudate.  No acute process. 8/08 admitted with unresponsiveness, required intubation. 8/09 low cvp, levophed, possible seizure  8/10 not moving rt ext upper and lowers EEG 8/10 >>suppressed backround MRI brain 8/11 >> multifocal acute Lt MCA ischemic infarcts, possible infarct Rt caudate head MRI brain 8/29:-Moderate-sized confluent left MCA territory infarct, increased in size relative to prior MRI from 07/15/2016. - largely subacute in appearance.    Cultures 8/08 Urine >> Enterobacter 8/08 Blood >> NEG 8/09 Sputum >> MDR Enterobacter 8/09 Blood >> NEG 8/12 C diff >> antigen and toxin positive 8/23 Blood>>> 1/2 Biofire= MRSa vs MRSE>>> 8/23 Urine>>> 8/23 Sputum>>> GNR >>    Antimicrobials: Vanc 8/8 >>8/10 Zosyn 8/8 >>8/11 meropenem 8/11>> 8/18 Oral vancomycin 8/12 >> Unasyn 8/23>>> 8/27 vanc IV 8/24>>>  Cefepime 8/27>>   Devices    LINES / TUBES:  ETT 8/8 > 8/18 Trach (JY) 8/18 >>  R IJ CVL 8/8 >>>8/25    Continuous Infusions: . sodium chloride 10 mL/hr at 08/03/16 1219  . feeding supplement (VITAL AF 1.2 CAL) 1,000 mL (08/04/16 0251)     Objective: Vitals:   08/04/16 0600 08/04/16 0727 08/04/16 0743 08/04/16 1234  BP: 131/74 102/68 102/68 131/76  Pulse: (!) 59 (!) 58 60   Resp: 18 12 15    Temp:   97.5 F (36.4 C) 97.3 F (36.3 C)  TempSrc:   Oral Oral  SpO2: 99% 93% 95%   Weight:      Height:        Intake/Output Summary (Last 24 hours) at 08/04/16 1245 Last data filed at 08/04/16 0826  Gross per 24 hour  Intake          2052.67 ml  Output             2150 ml  Net           -97.33 ml   Filed Weights   08/02/16 0309 08/03/16 0500 08/04/16 0500  Weight: 63.7 kg (140 lb 6.9 oz) 60.8 kg (134 lb  0.6 oz) 60.6 kg (133 lb 9.6 oz)    Examination:  General:will open eyes to painful stimuli. Poor withdraw from painful stimuli. Does not follow commands. Eyes: pupils equal round reactive to light and accommodation ENT: Negative Runny nose, negative gingival bleeding, Neck:  Negative scars, masses, torticollis, lymphadenopathy, JVD, right IJ in place negative sign of infection Lungs: Clear to auscultation bilaterally without wheezes or crackles Cardiovascular: Regular rate and rhythm without murmur gallop or rub normal S1 and S2 Abdomen: negative abdominal pain, nondistended, positive soft, bowel sounds, no rebound, no ascites, no appreciable mass Extremities: No significant cyanosis, clubbing, or edema bilateral lower extremities Skin: Negative rashes, lesions, ulcers Psychiatric:  Negative depression, negative anxiety, negative fatigue, negative mania  Central nervous system: Unresponsive .     Data Reviewed: Care during the described time interval was provided by me .  I have reviewed this patient's available data, including medical history, events of note, physical examination, and all test results as part of my evaluation. I have personally reviewed and interpreted all radiology studies.  CBC:  Recent Labs Lab 07/30/16 0249 07/31/16 0250 08/01/16 0119 08/02/16 0303 08/04/16 0451  WBC 11.3*  7.8 7.9 6.3 9.7  HGB 7.8* 9.1* 9.1* 10.3* 10.1*  HCT 24.5* 28.5* 28.0* 31.0* 31.2*  MCV 90.7 89.1 87.2 88.3 89.1  PLT 303 309 266 237 308   Basic Metabolic Panel:  Recent Labs Lab 07/30/16 0249 07/31/16 0250 07/31/16 1337 08/01/16 0119 08/02/16 0303 08/04/16 0451  NA 145 146*  --  143 142 144  K 3.4* 2.8* 4.0 2.9* 3.6 3.0*  CL 112* 110  --  107 103 100*  CO2 22 25  --  26 29 36*  GLUCOSE 112* 96  --  119* 112* 97  BUN 16 18  --  16 17 14   CREATININE 0.68 0.67  --  0.61 0.58* 0.63  CALCIUM 8.7* 8.6*  --  8.6* 8.5* 8.7*  MG  --   --  1.9 1.7 2.3  --    GFR: Estimated  Creatinine Clearance: 93.6 mL/min (by C-G formula based on SCr of 0.8 mg/dL). Liver Function Tests:  Recent Labs Lab 08/02/16 0303 08/04/16 0451  AST 17 14*  ALT 13* 12*  ALKPHOS 47 51  BILITOT 0.3 0.3  PROT 5.2* 5.5*  ALBUMIN 2.0* 2.1*   No results for input(s): LIPASE, AMYLASE in the last 168 hours. No results for input(s): AMMONIA in the last 168 hours. Coagulation Profile:  Recent Labs Lab 07/29/16 0415  INR 1.18   Cardiac Enzymes: No results for input(s): CKTOTAL, CKMB, CKMBINDEX, TROPONINI in the last 168 hours. BNP (last 3 results) No results for input(s): PROBNP in the last 8760 hours. HbA1C: No results for input(s): HGBA1C in the last 72 hours. CBG:  Recent Labs Lab 08/03/16 2029 08/03/16 2328 08/04/16 0314 08/04/16 0742 08/04/16 1234  GLUCAP 86 119* 105* 97 111*   Lipid Profile: No results for input(s): CHOL, HDL, LDLCALC, TRIG, CHOLHDL, LDLDIRECT in the last 72 hours. Thyroid Function Tests: No results for input(s): TSH, T4TOTAL, FREET4, T3FREE, THYROIDAB in the last 72 hours. Anemia Panel: No results for input(s): VITAMINB12, FOLATE, FERRITIN, TIBC, IRON, RETICCTPCT in the last 72 hours. Urine analysis:    Component Value Date/Time   COLORURINE YELLOW 07/27/2016 1115   APPEARANCEUR CLOUDY (A) 07/27/2016 1115   LABSPEC 1.016 07/27/2016 1115   PHURINE 6.0 07/27/2016 1115   GLUCOSEU NEGATIVE 07/27/2016 1115   HGBUR NEGATIVE 07/27/2016 1115   BILIRUBINUR NEGATIVE 07/27/2016 1115   KETONESUR NEGATIVE 07/27/2016 1115   PROTEINUR NEGATIVE 07/27/2016 1115   UROBILINOGEN 1.0 11/13/2011 2010   NITRITE NEGATIVE 07/27/2016 1115   LEUKOCYTESUR TRACE (A) 07/27/2016 1115   Sepsis Labs: @LABRCNTIP (procalcitonin:4,lacticidven:4)  ) Recent Results (from the past 240 hour(s))  Urine culture     Status: Abnormal   Collection Time: 07/27/16 11:15 AM  Result Value Ref Range Status   Specimen Description URINE, RANDOM  Final   Special Requests NONE  Final     Culture (A)  Final    >=100,000 COLONIES/mL ENTEROCOCCUS FAECIUM VANCOMYCIN RESISTANT ENTEROCOCCUS    Report Status 07/30/2016 FINAL  Final   Organism ID, Bacteria ENTEROCOCCUS FAECIUM (A)  Final      Susceptibility   Enterococcus faecium - MIC*    AMPICILLIN >=32 RESISTANT Resistant     LEVOFLOXACIN >=8 RESISTANT Resistant     NITROFURANTOIN 128 RESISTANT Resistant     VANCOMYCIN >=32 RESISTANT Resistant     LINEZOLID 2 SENSITIVE Sensitive     * >=100,000 COLONIES/mL ENTEROCOCCUS FAECIUM  Culture, respiratory (NON-Expectorated)     Status: None   Collection Time: 07/27/16 11:15 AM  Result Value Ref  Range Status   Specimen Description TRACHEAL ASPIRATE  Final   Special Requests NONE  Final   Gram Stain   Final    MODERATE WBC PRESENT, PREDOMINANTLY PMN MODERATE GRAM POSITIVE COCCI IN PAIRS IN CLUSTERS FEW GRAM POSITIVE RODS RARE GRAM NEGATIVE RODS    Culture   Final    ABUNDANT METHICILLIN RESISTANT STAPHYLOCOCCUS AUREUS ABUNDANT ENTEROBACTER SPECIES    Report Status 07/30/2016 FINAL  Final   Organism ID, Bacteria METHICILLIN RESISTANT STAPHYLOCOCCUS AUREUS  Final   Organism ID, Bacteria ENTEROBACTER SPECIES  Final      Susceptibility   Enterobacter species - MIC*    CEFAZOLIN >=64 RESISTANT Resistant     CEFEPIME 4 SENSITIVE Sensitive     CEFTAZIDIME >=64 RESISTANT Resistant     CEFTRIAXONE >=64 RESISTANT Resistant     CIPROFLOXACIN 1 SENSITIVE Sensitive     GENTAMICIN >=16 RESISTANT Resistant     IMIPENEM 1 SENSITIVE Sensitive     TRIMETH/SULFA <=20 SENSITIVE Sensitive     PIP/TAZO >=128 RESISTANT Resistant     * ABUNDANT ENTEROBACTER SPECIES   Methicillin resistant staphylococcus aureus - MIC*    CIPROFLOXACIN >=8 RESISTANT Resistant     ERYTHROMYCIN <=0.25 SENSITIVE Sensitive     GENTAMICIN <=0.5 SENSITIVE Sensitive     OXACILLIN >=4 RESISTANT Resistant     TETRACYCLINE <=1 SENSITIVE Sensitive     VANCOMYCIN <=0.5 SENSITIVE Sensitive     TRIMETH/SULFA <=10  SENSITIVE Sensitive     CLINDAMYCIN <=0.25 SENSITIVE Sensitive     RIFAMPIN <=0.5 SENSITIVE Sensitive     Inducible Clindamycin NEGATIVE Sensitive     * ABUNDANT METHICILLIN RESISTANT STAPHYLOCOCCUS AUREUS  Culture, blood (Routine X 2) w Reflex to ID Panel     Status: Abnormal   Collection Time: 07/27/16 12:23 PM  Result Value Ref Range Status   Specimen Description BLOOD RIGHT ARM  Final   Special Requests BOTTLES DRAWN AEROBIC AND ANAEROBIC 5 CC  Final   Culture  Setup Time   Final    GRAM POSITIVE COCCI IN CLUSTERS AEROBIC BOTTLE ONLY CRITICAL RESULT CALLED TO, READ BACK BY AND VERIFIED WITH: M. Bitonti Pharm.D. 11:30 07/28/16 ([email or web address])    Culture METHICILLIN RESISTANT STAPHYLOCOCCUS AUREUS (A)  Final   Report Status 07/30/2016 FINAL  Final   Organism ID, Bacteria METHICILLIN RESISTANT STAPHYLOCOCCUS AUREUS  Final      Susceptibility   Methicillin resistant staphylococcus aureus - MIC*    CIPROFLOXACIN >=8 RESISTANT Resistant     ERYTHROMYCIN <=0.25 SENSITIVE Sensitive     GENTAMICIN <=0.5 SENSITIVE Sensitive     OXACILLIN >=4 RESISTANT Resistant     TETRACYCLINE <=1 SENSITIVE Sensitive     VANCOMYCIN <=0.5 SENSITIVE Sensitive     TRIMETH/SULFA <=10 SENSITIVE Sensitive     CLINDAMYCIN <=0.25 SENSITIVE Sensitive     RIFAMPIN <=0.5 SENSITIVE Sensitive     Inducible Clindamycin NEGATIVE Sensitive     * METHICILLIN RESISTANT STAPHYLOCOCCUS AUREUS  Culture, blood (Routine X 2) w Reflex to ID Panel     Status: None   Collection Time: 07/27/16 12:23 PM  Result Value Ref Range Status   Specimen Description BLOOD RIGHT ARM  Final   Special Requests BOTTLES DRAWN AEROBIC AND ANAEROBIC 5 CC  Final   Culture NO GROWTH 7 DAYS  Final   Report Status 08/03/2016 FINAL  Final  Blood Culture ID Panel (Reflexed)     Status: Abnormal   Collection Time: 07/27/16 12:23 PM  Result Value Ref Range Status  Enterococcus species NOT DETECTED NOT DETECTED Final   Listeria monocytogenes NOT DETECTED  NOT DETECTED Final   Staphylococcus species DETECTED (A) NOT DETECTED Final    Comment: CRITICAL RESULT CALLED TO, READ BACK BY AND VERIFIED WITH: M. Bitonti Pharm.D. 11:30 07/28/16 ([email or web address])    Staphylococcus aureus DETECTED (A) NOT DETECTED Final    Comment: CRITICAL RESULT CALLED TO, READ BACK BY AND VERIFIED WITH: M. Bitonti Pharm.D. 11:30 07/28/16  ([email or web address])    Methicillin resistance DETECTED (A) NOT DETECTED Final    Comment: CRITICAL RESULT CALLED TO, READ BACK BY AND VERIFIED WITH: M. Bitonti Pharm.D. 11:30 07/28/16 (wilsonm)    Streptococcus species NOT DETECTED NOT DETECTED Final   Streptococcus agalactiae NOT DETECTED NOT DETECTED Final   Streptococcus pneumoniae NOT DETECTED NOT DETECTED Final   Streptococcus pyogenes NOT DETECTED NOT DETECTED Final   Acinetobacter baumannii NOT DETECTED NOT DETECTED Final   Enterobacteriaceae species NOT DETECTED NOT DETECTED Final   Enterobacter cloacae complex NOT DETECTED NOT DETECTED Final   Escherichia coli NOT DETECTED NOT DETECTED Final   Klebsiella oxytoca NOT DETECTED NOT DETECTED Final   Klebsiella pneumoniae NOT DETECTED NOT DETECTED Final   Proteus species NOT DETECTED NOT DETECTED Final   Serratia marcescens NOT DETECTED NOT DETECTED Final   Haemophilus influenzae NOT DETECTED NOT DETECTED Final   Neisseria meningitidis NOT DETECTED NOT DETECTED Final   Pseudomonas aeruginosa NOT DETECTED NOT DETECTED Final   Candida albicans NOT DETECTED NOT DETECTED Final   Candida glabrata NOT DETECTED NOT DETECTED Final   Candida krusei NOT DETECTED NOT DETECTED Final   Candida parapsilosis NOT DETECTED NOT DETECTED Final   Candida tropicalis NOT DETECTED NOT DETECTED Final  Culture, blood (routine x 2)     Status: None   Collection Time: 07/28/16  6:16 PM  Result Value Ref Range Status   Specimen Description BLOOD LEFT ANTECUBITAL  Final   Special Requests BOTTLES DRAWN AEROBIC AND ANAEROBIC 10CC EA  Final   Culture NO GROWTH 5 DAYS   Final   Report Status 08/02/2016 FINAL  Final  Culture, blood (routine x 2)     Status: Abnormal   Collection Time: 07/28/16  6:24 PM  Result Value Ref Range Status   Specimen Description BLOOD RIGHT ANTECUBITAL  Final   Special Requests IN PEDIATRIC BOTTLE 1.5CC  Final   Culture  Setup Time   Final    GRAM POSITIVE COCCI IN CLUSTERS AEROBIC BOTTLE ONLY CRITICAL RESULT CALLED TO, READ BACK BY AND VERIFIED WITH: A MYER 07/29/16 @ 1841 M VESTAL    Culture (A)  Final    STAPHYLOCOCCUS AUREUS SUSCEPTIBILITIES PERFORMED ON PREVIOUS CULTURE WITHIN THE LAST 5 DAYS.    Report Status 07/31/2016 FINAL  Final  Culture, Urine     Status: Abnormal   Collection Time: 07/31/16  7:00 PM  Result Value Ref Range Status   Specimen Description URINE, CLEAN CATCH  Final   Special Requests NONE  Final   Culture >=100,000 COLONIES/mL YEAST (A)  Final   Report Status 08/03/2016 FINAL  Final  Culture, blood (routine x 2)     Status: None (Preliminary result)   Collection Time: 08/03/16 12:38 PM  Result Value Ref Range Status   Specimen Description BLOOD LEFT ARM  Final   Special Requests IN PEDIATRIC BOTTLE 2.5CC  Final   Culture NO GROWTH <12 HOURS  Final   Report Status PENDING  Incomplete         Radiology Studies: Mr Brain Wo Contrast  Result Date: 08/02/2016 CLINICAL DATA:  Initial evaluation for decreased responsiveness, status post recent stroke. Question new or worsened stroke. EXAM: MRI HEAD WITHOUT CONTRAST TECHNIQUE: Multiplanar, multiecho pulse sequences of the brain and surrounding structures were obtained without intravenous contrast. COMPARISON:  Previous brain MRI from 07/15/2016. FINDINGS: Limited DWI sequences were performed. Additionally, study is somewhat degraded by motion artifact. Axial DWI sequence demonstrates a moderate-size confluent diffusion abnormality involving the left MCA territory, increased in size relative to most recent MRI from 07/15/2016. Area of infarction  involves the left frontal lobe, including the operculum and insula, with extension towards the the left temporal lobe. This is largely subacute in appearance with normalization of associated ADC signal. The No significant mass effect. Evaluation for associated hemorrhage limited on this exam. No other acute intracranial infarct. No other definite acute intracranial process on this limited study. Remote watershed type infarcts involving the right cerebral hemisphere again noted. Remote lacunar infarcts involving the left basal ganglia noted. Chronic microvascular ischemic disease grossly stable. No hydrocephalus or midline shift. No extra-axial fluid collection. IMPRESSION: 1. Moderate-sized confluent left MCA territory infarct, increased in size relative to prior MRI from 07/15/2016. This is largely subacute in appearance. Mild localized edema without significant mass effect. The 2. No other definite acute intracranial process on this limited study. Electronically Signed   By: Rise Mu M.D.   On: 08/02/2016 16:50        Scheduled Meds: . aspirin  325 mg Per Tube Daily  . atorvastatin  20 mg Per Tube q1800  . ceFEPime (MAXIPIME) IV  2 g Intravenous Q8H  . chlorhexidine  15 mL Mouth Rinse BID  . famotidine  20 mg Per Tube BID  . fludrocortisone  0.1 mg Per Tube Daily  . folic acid  1 mg Per Tube Daily  . heparin subcutaneous  5,000 Units Subcutaneous Q8H  . hydrocortisone  50 mg Per Tube Q6H  . insulin aspart  0-20 Units Subcutaneous Q4H  . levETIRAcetam  1,500 mg Per Tube BID  . mouth rinse  15 mL Mouth Rinse q12n4p  . potassium chloride  10 mEq Intravenous Q1 Hr x 5  . QUEtiapine  100 mg Per Tube BID  . thiamine  100 mg Per Tube Daily  . Valproate Sodium  750 mg Per Tube Q8H  . vancomycin  500 mg Per Tube Q6H  . vancomycin  1,000 mg Intravenous Q12H   Continuous Infusions: . sodium chloride 10 mL/hr at 08/03/16 1219  . feeding supplement (VITAL AF 1.2 CAL) 1,000 mL (08/04/16  0251)     LOS: 23 days    Time spent: 40 minutes    Aaniya Sterba, Roselind Messier, MD [facility] Pager [phone]   If 7PM-7AM, please contact night-coverage [email or web address] Password [email or web address] 08/04/2016, 12:45 PM

## 2016-08-04 NOTE — Progress Notes (Signed)
Patient ID: Dale [name], male   DOB: 06/16/1966, 50 y.o.   MRN: [id number]          [facility]    Date of Admission:  07/12/2016           Day 19 oral vancomycin        Day 8 IV vancomycin        Day 3 cefepime  Active Problems:   MRSA bacteremia   C. difficile colitis   Aspiration pneumonia (HCC)   History of bacterial meningitis   Homeless   Tobacco abuse   ETOH abuse   Substance abuse   Cardiogenic shock (HCC)   Cardiac arrest (HCC)   Acute on chronic respiratory failure with hypoxia (HCC)   Tracheostomy status (HCC)   NSTEMI (non-ST elevated myocardial infarction) (HCC)   Chronic combined systolic and diastolic CHF (congestive heart failure) (HCC)   Cerebral thrombosis with cerebral infarction   Moderate protein-calorie malnutrition (HCC)   HLD (hyperlipidemia)   Dysphagia   Seizures (HCC)   Adrenal insufficiency (Addison's disease) (HCC)   Encephalopathy   Arterial hypotension   New onset a-fib (HCC)   Syphilis   Pressure ulcer   HCAP (healthcare-associated pneumonia)   . aspirin  325 mg Per Tube Daily  . atorvastatin  20 mg Per Tube q1800  . ceFEPime (MAXIPIME) IV  2 g Intravenous Q8H  . chlorhexidine  15 mL Mouth Rinse BID  . famotidine  20 mg Per Tube BID  . fludrocortisone  0.1 mg Per Tube Daily  . folic acid  1 mg Per Tube Daily  . heparin subcutaneous  5,000 Units Subcutaneous Q8H  . hydrocortisone  50 mg Per Tube Q6H  . insulin aspart  0-20 Units Subcutaneous Q4H  . levETIRAcetam  1,500 mg Per Tube BID  . mouth rinse  15 mL Mouth Rinse q12n4p  . QUEtiapine  75 mg Per Tube BID  . thiamine  100 mg Per Tube Daily  . Valproate Sodium  750 mg Per Tube Q8H  . vancomycin  500 mg Per Tube Q6H  . vancomycin  1,000 mg Intravenous Q12H   Review of Systems: Review of Systems  Unable to perform ROS: Intubated    Past Medical History:  Diagnosis Date  . Acute encephalopathy   . Acute ischemic stroke (HCC)   . Acute renal  failure (HCC)   . Acute respiratory failure with hypoxia (HCC)   . AKI (acute kidney injury) (HCC) 02/16/2016  . Altered mental status   . Cardiac arrest (HCC)   . Cardiogenic shock (HCC)   . Cerebral septic emboli (HCC)   . Cerebral thrombosis with cerebral infarction 03/25/2016  . CHF (congestive heart failure) (HCC)   . Convulsions/seizures (HCC) 05/04/2016   Seizure disorder - Continue Keppra, Depakote.     . Dementia 04/27/2016  . Depression   . Dysphagia 05/04/2016   Dysphagia - Dysphagia 1 diet per SLP     . ETOH abuse   . Hydrocephalus   . Hyperlipidemia 05/04/2016   Dyslipidemia - Continue statin.     Marland Kitchen Hypernatremia   . Hypothermia 03/20/2016  . Low serum cortisol level (HCC) 05/04/2016   Low a.m. cortisol  - Was 1.2 checked on 04/03/2016  - This likely is reflective of cortisol suppression from steroid use - Tapered steroids off   . Meningitis, pneumococcal, recurrent   . Meningitis, streptococcal 02/16/2016  . Meningoencephalitis   . NSTEMI (non-ST elevated myocardial infarction) (HCC)  03/14/2016  . Protein-calorie malnutrition, severe (HCC)   . Respiratory failure (HCC) 03/03/2016  . Septic shock (HCC)   . Severe sepsis (HCC)   . 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 Substance abuse   . Systolic and diastolic CHF, chronic (HCC)   . Tobacco abuse 02/16/2016  . 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 Urinary retention 05/04/2016   Urinary retention - Continue Flomax       Social History  Substance Use Topics  . Smoking status: Current Every Day Smoker    Packs/day: 0.10    Types: Cigarettes  . Smokeless tobacco: Never Used  . Alcohol use No    History reviewed. No pertinent family history. No Known Allergies  OBJECTIVE: Vitals:   08/04/16 0600 08/04/16 0727 08/04/16 0743 08/04/16 1234  BP: 131/74 102/68 102/68 131/76  Pulse: (!) 59 (!) 58 60   Resp: 18 12 15    Temp:   97.5 F (36.4 C) 97.3 F (36.3 C)  TempSrc:   Oral Oral  SpO2: 99% 93% 95%     Weight:      Height:       Body mass index is 20.31 kg/m.  Physical Exam  Constitutional:  He is unresponsive.  Cardiovascular: Regular rhythm.   No murmur heard. He is bradycardic.  Pulmonary/Chest: Breath sounds normal. He has no wheezes. He has no rales.  His nurse reports that he has a strong productive cough.  Abdominal: Soft.  His nurse reports that he had one liquid bowel movement this morning.  Skin: No rash noted.    Lab Results Lab Results  Component Value Date   WBC 9.7 08/04/2016   HGB 10.1 (L) 08/04/2016   HCT 31.2 (L) 08/04/2016   MCV 89.1 08/04/2016   PLT 308 08/04/2016    Lab Results  Component Value Date   CREATININE 0.63 08/04/2016   BUN 14 08/04/2016   NA 144 08/04/2016   K 3.0 (L) 08/04/2016   CL 100 (L) 08/04/2016   CO2 36 (H) 08/04/2016    Lab Results  Component Value Date   ALT 12 (L) 08/04/2016   AST 14 (L) 08/04/2016   ALKPHOS 51 08/04/2016   BILITOT 0.3 08/04/2016     Microbiology: Recent Results (from the past 240 hour(s))  Urine culture     Status: Abnormal   Collection Time: 07/27/16 11:15 AM  Result Value Ref Range Status   Specimen Description URINE, RANDOM  Final   Special Requests NONE  Final   Culture (A)  Final    >=100,000 COLONIES/mL ENTEROCOCCUS FAECIUM VANCOMYCIN RESISTANT ENTEROCOCCUS    Report Status 07/30/2016 FINAL  Final   Organism ID, Bacteria ENTEROCOCCUS FAECIUM (A)  Final      Susceptibility   Enterococcus faecium - MIC*    AMPICILLIN >=32 RESISTANT Resistant     LEVOFLOXACIN >=8 RESISTANT Resistant     NITROFURANTOIN 128 RESISTANT Resistant     VANCOMYCIN >=32 RESISTANT Resistant     LINEZOLID 2 SENSITIVE Sensitive     * >=100,000 COLONIES/mL ENTEROCOCCUS FAECIUM  Culture, respiratory (NON-Expectorated)     Status: None   Collection Time: 07/27/16 11:15 AM  Result Value Ref Range Status   Specimen Description TRACHEAL ASPIRATE  Final   Special Requests NONE  Final   Gram Stain   Final     MODERATE WBC PRESENT, PREDOMINANTLY PMN MODERATE GRAM POSITIVE COCCI IN PAIRS IN CLUSTERS FEW GRAM POSITIVE RODS RARE GRAM NEGATIVE RODS    Culture   Final  ABUNDANT METHICILLIN RESISTANT STAPHYLOCOCCUS AUREUS ABUNDANT ENTEROBACTER SPECIES    Report Status 07/30/2016 FINAL  Final   Organism ID, Bacteria METHICILLIN RESISTANT STAPHYLOCOCCUS AUREUS  Final   Organism ID, Bacteria ENTEROBACTER SPECIES  Final      Susceptibility   Enterobacter species - MIC*    CEFAZOLIN >=64 RESISTANT Resistant     CEFEPIME 4 SENSITIVE Sensitive     CEFTAZIDIME >=64 RESISTANT Resistant     CEFTRIAXONE >=64 RESISTANT Resistant     CIPROFLOXACIN 1 SENSITIVE Sensitive     GENTAMICIN >=16 RESISTANT Resistant     IMIPENEM 1 SENSITIVE Sensitive     TRIMETH/SULFA <=20 SENSITIVE Sensitive     PIP/TAZO >=128 RESISTANT Resistant     * ABUNDANT ENTEROBACTER SPECIES   Methicillin resistant staphylococcus aureus - MIC*    CIPROFLOXACIN >=8 RESISTANT Resistant     ERYTHROMYCIN <=0.25 SENSITIVE Sensitive     GENTAMICIN <=0.5 SENSITIVE Sensitive     OXACILLIN >=4 RESISTANT Resistant     TETRACYCLINE <=1 SENSITIVE Sensitive     VANCOMYCIN <=0.5 SENSITIVE Sensitive     TRIMETH/SULFA <=10 SENSITIVE Sensitive     CLINDAMYCIN <=0.25 SENSITIVE Sensitive     RIFAMPIN <=0.5 SENSITIVE Sensitive     Inducible Clindamycin NEGATIVE Sensitive     * ABUNDANT METHICILLIN RESISTANT STAPHYLOCOCCUS AUREUS  Culture, blood (Routine X 2) w Reflex to ID Panel     Status: Abnormal   Collection Time: 07/27/16 12:23 PM  Result Value Ref Range Status   Specimen Description BLOOD RIGHT ARM  Final   Special Requests BOTTLES DRAWN AEROBIC AND ANAEROBIC 5 CC  Final   Culture  Setup Time   Final    GRAM POSITIVE COCCI IN CLUSTERS AEROBIC BOTTLE ONLY CRITICAL RESULT CALLED TO, READ BACK BY AND VERIFIED WITH: M. Bitonti Pharm.D. 11:30 07/28/16 ([email or web address])    Culture METHICILLIN RESISTANT STAPHYLOCOCCUS AUREUS (A)  Final   Report Status  07/30/2016 FINAL  Final   Organism ID, Bacteria METHICILLIN RESISTANT STAPHYLOCOCCUS AUREUS  Final      Susceptibility   Methicillin resistant staphylococcus aureus - MIC*    CIPROFLOXACIN >=8 RESISTANT Resistant     ERYTHROMYCIN <=0.25 SENSITIVE Sensitive     GENTAMICIN <=0.5 SENSITIVE Sensitive     OXACILLIN >=4 RESISTANT Resistant     TETRACYCLINE <=1 SENSITIVE Sensitive     VANCOMYCIN <=0.5 SENSITIVE Sensitive     TRIMETH/SULFA <=10 SENSITIVE Sensitive     CLINDAMYCIN <=0.25 SENSITIVE Sensitive     RIFAMPIN <=0.5 SENSITIVE Sensitive     Inducible Clindamycin NEGATIVE Sensitive     * METHICILLIN RESISTANT STAPHYLOCOCCUS AUREUS  Culture, blood (Routine X 2) w Reflex to ID Panel     Status: None   Collection Time: 07/27/16 12:23 PM  Result Value Ref Range Status   Specimen Description BLOOD RIGHT ARM  Final   Special Requests BOTTLES DRAWN AEROBIC AND ANAEROBIC 5 CC  Final   Culture NO GROWTH 7 DAYS  Final   Report Status 08/03/2016 FINAL  Final  Blood Culture ID Panel (Reflexed)     Status: Abnormal   Collection Time: 07/27/16 12:23 PM  Result Value Ref Range Status   Enterococcus species NOT DETECTED NOT DETECTED Final   Listeria monocytogenes NOT DETECTED NOT DETECTED Final   Staphylococcus species DETECTED (A) NOT DETECTED Final    Comment: CRITICAL RESULT CALLED TO, READ BACK BY AND VERIFIED WITH: M. Bitonti Pharm.D. 11:30 07/28/16 ([email or web address])    Staphylococcus aureus DETECTED (A) NOT DETECTED Final  Comment: CRITICAL RESULT CALLED TO, READ BACK BY AND VERIFIED WITH: M. Bitonti Pharm.D. 11:30 07/28/16  ([email or web address])    Methicillin resistance DETECTED (A) NOT DETECTED Final    Comment: CRITICAL RESULT CALLED TO, READ BACK BY AND VERIFIED WITH: M. Bitonti Pharm.D. 11:30 07/28/16 (wilsonm)    Streptococcus species NOT DETECTED NOT DETECTED Final   Streptococcus agalactiae NOT DETECTED NOT DETECTED Final   Streptococcus pneumoniae NOT DETECTED NOT DETECTED Final   Streptococcus  pyogenes NOT DETECTED NOT DETECTED Final   Acinetobacter baumannii NOT DETECTED NOT DETECTED Final   Enterobacteriaceae species NOT DETECTED NOT DETECTED Final   Enterobacter cloacae complex NOT DETECTED NOT DETECTED Final   Escherichia coli NOT DETECTED NOT DETECTED Final   Klebsiella oxytoca NOT DETECTED NOT DETECTED Final   Klebsiella pneumoniae NOT DETECTED NOT DETECTED Final   Proteus species NOT DETECTED NOT DETECTED Final   Serratia marcescens NOT DETECTED NOT DETECTED Final   Haemophilus influenzae NOT DETECTED NOT DETECTED Final   Neisseria meningitidis NOT DETECTED NOT DETECTED Final   Pseudomonas aeruginosa NOT DETECTED NOT DETECTED Final   Candida albicans NOT DETECTED NOT DETECTED Final   Candida glabrata NOT DETECTED NOT DETECTED Final   Candida krusei NOT DETECTED NOT DETECTED Final   Candida parapsilosis NOT DETECTED NOT DETECTED Final   Candida tropicalis NOT DETECTED NOT DETECTED Final  Culture, blood (routine x 2)     Status: None   Collection Time: 07/28/16  6:16 PM  Result Value Ref Range Status   Specimen Description BLOOD LEFT ANTECUBITAL  Final   Special Requests BOTTLES DRAWN AEROBIC AND ANAEROBIC 10CC EA  Final   Culture NO GROWTH 5 DAYS  Final   Report Status 08/02/2016 FINAL  Final  Culture, blood (routine x 2)     Status: Abnormal   Collection Time: 07/28/16  6:24 PM  Result Value Ref Range Status   Specimen Description BLOOD RIGHT ANTECUBITAL  Final   Special Requests IN PEDIATRIC BOTTLE 1.5CC  Final   Culture  Setup Time   Final    GRAM POSITIVE COCCI IN CLUSTERS AEROBIC BOTTLE ONLY CRITICAL RESULT CALLED TO, READ BACK BY AND VERIFIED WITH: A MYER 07/29/16 @ 1841 M VESTAL    Culture (A)  Final    STAPHYLOCOCCUS AUREUS SUSCEPTIBILITIES PERFORMED ON PREVIOUS CULTURE WITHIN THE LAST 5 DAYS.    Report Status 07/31/2016 FINAL  Final  Culture, Urine     Status: Abnormal   Collection Time: 07/31/16  7:00 PM  Result Value Ref Range Status   Specimen  Description URINE, CLEAN CATCH  Final   Special Requests NONE  Final   Culture >=100,000 COLONIES/mL YEAST (A)  Final   Report Status 08/03/2016 FINAL  Final  Culture, blood (routine x 2)     Status: None (Preliminary result)   Collection Time: 08/03/16 12:38 PM  Result Value Ref Range Status   Specimen Description BLOOD LEFT ARM  Final   Special Requests IN PEDIATRIC BOTTLE 2.5CC  Final   Culture NO GROWTH 1 DAY  Final   Report Status PENDING  Incomplete  Culture, blood (routine x 2)     Status: None (Preliminary result)   Collection Time: 08/03/16  4:00 PM  Result Value Ref Range Status   Specimen Description BLOOD LEFT ARM  Final   Special Requests IN PEDIATRIC BOTTLE 2.5CC  Final   Culture NO GROWTH < 24 HOURS  Final   Report Status PENDING  Incomplete     ASSESSMENT: His prognosis remains  extremely poor. He has been hospitalized nearly continuously since March when he had pneumococcal meningitis. He is now unresponsive. He has had a left middle cerebral artery stroke. He has C. difficile colitis, recent MRSA bacteremia and healthcare associated pneumonia due to MRSA and Enterobacter. I do not believe the yeast in his urine needs treatment. Repeat blood cultures yesterday are negative. I recommend 4 more days of cefepime for his HCAP. I recommend 2 more weeks of IV vancomycin. I would continue oral vancomycin one week after stopping cefepime.  PLAN: 1. Continue cefepime through 08/08/2016 2. Continue oral vancomycin through 08/15/2016 3. Continue IV vancomycin through 08/18/2016 4. I will sign off now  Cliffton AstersJohn Donevan Biller, MD [facility] [phone] pager   [phone] cell 08/04/2016, 2:38 PM

## 2016-08-04 NOTE — Progress Notes (Signed)
Pharmacy Antibiotic Note  Dale [name] is a [age] [address].o. male admitted on 07/12/2016 with altered mental status.  Pharmacy has been consulted for Vancomycin dosing for bacteremia/HCAP. WBC WNL. Renal function ok.   Vancomycin trough this AM is therapeutic at 18  Plan: -Cont vancomycin 1000 mg IV q12h -Re-check trough as needed  Height: 5\' 8"  (172.7 cm) Weight: 133 lb 9.6 oz (60.6 kg) IBW/kg (Calculated) : 68.4  Temp (24hrs), Avg:97.9 F (36.6 C), Min:97.3 F (36.3 C), Max:100.7 F (38.2 C)   Recent Labs Lab 07/30/16 0249 07/31/16 0250 08/01/16 0119 08/01/16 1245 08/02/16 0303 08/04/16 0451  WBC 11.3* 7.8 7.9  --  6.3 9.7  CREATININE 0.68 0.67 0.61  --  0.58* 0.63  VANCOTROUGH  --   --   --  24*  --  18    Estimated Creatinine Clearance: 93.6 mL/min (by C-G formula based on SCr of 0.8 mg/dL).    No Known Allergies   Dale [name], Dale [name] 08/04/2016 5:19 AM

## 2016-08-05 DIAGNOSIS — Z7189 Other specified counseling: Secondary | ICD-10-CM

## 2016-08-05 LAB — GLUCOSE, CAPILLARY
GLUCOSE-CAPILLARY: 102 mg/dL — AB (ref 65–99)
GLUCOSE-CAPILLARY: 99 mg/dL (ref 65–99)
GLUCOSE-CAPILLARY: 115 mg/dL — AB (ref 65–99)
Glucose-Capillary: 72 mg/dL (ref 65–99)
Glucose-Capillary: 80 mg/dL (ref 65–99)
Glucose-Capillary: 74 mg/dL (ref 65–99)

## 2016-08-05 MED ORDER — ALBUTEROL SULFATE (2.5 MG/3ML) 0.083% IN NEBU: 2.5000 mg | INHALATION_SOLUTION | RESPIRATORY_TRACT | Status: DC | PRN

## 2016-08-05 MED ORDER — HYDROCORTISONE 10 MG PO TABS
50.0000 mg | ORAL_TABLET | Freq: Three times a day (TID) | ORAL | Status: DC
  Administered 2016-08-05 – 2016-08-08 (×9): 50 mg
  Filled 2016-08-05 (×9): qty 1

## 2016-08-05 MED ORDER — POTASSIUM CHLORIDE 20 MEQ/15ML (10%) PO SOLN
40.0000 meq | Freq: Two times a day (BID) | ORAL | Status: AC
  Administered 2016-08-05 – 2016-08-06 (×4): 40 meq
  Filled 2016-08-05 (×4): qty 30

## 2016-08-05 NOTE — Plan of Care (Signed)
Problem: Physical Regulation: Goal: Will remain free from infection Outcome: Progressing Patient is on antibiotic.  Problem: Skin Integrity: Goal: Risk for impaired skin integrity will decrease Outcome: Progressing Patient is incontinent of stools.

## 2016-08-05 NOTE — Progress Notes (Signed)
Once patient is ready for discharge (and on cuffless trach) please send 1) extra 6mm shiley cuff 2) Extra cleaning kits with patient to SNF to last SNF through holiday weekend.  Osborne Cascoadia Zerick Prevette LCSWA [phone]

## 2016-08-05 NOTE — Progress Notes (Signed)
[facility] TEAM 1 - Stepdown/ICU TEAM  ERIQ HUFFORD  ZOX:[id number] DOB: 09-09-1966 DOA: 07/12/2016 PCP: No primary care provider on file.    Brief Narrative:  50 year old M who presented from [facility] w/ a Hx of multiple admissions since April 2017, Depression, Dementia; Seizures; Streptococcal Meningoencephalitis/Meningitis, Cerebral thrombosis with cerebral infarction; Cerebral septic emboli, NSTEMI, Cardiac arrest w/ Cardiogenic shock; Chronic Systolic and diastolic CHF, Acute respiratory failure with hypoxia; Substance abuse; and ETOH abuse who was admitted again 8/08 with altered mental status and an inability to protect his airway. He was hypotensive and hypothermic from UTI, acute resp failure, and severe delirium.  Subjective: The patient remains entirely noncommunicative.  He does not respond to sternal rub or nail bed pressure.  He does not appear uncomfortable and he does not appear to be in acute respiratory distress.  Assessment & Plan:  Encephalopathy - Subacute CVA extension  Remains sedate - MRI brain 8/29 noted enlarging area of L MCA infract, but this appears subacute and I am not convinced it fits the timeframe to fully explain his AMS, though it conceivably could - cont to minimize sedating meds and follow   Acute on chronic hypoxic respiratory failure  -due to inability to protect airway -hx trach March 2017 (s/p decannulation) -appears to be stable on trach collar only - to have trach changed to #6 cuffless today per notes   MRSA bacteremia  abx per ID - blood cx 8/24 again 2/2 for MRSA - cont IV Vanc through 08/18/2016  Enterobacter & MRSA HCAP Remains on abx coverage as directed by ID - to cont cefepime through 08/08/2016  C diff colitis Cont enteral vanc through 08/15/2016  Septic shock due to multiple infections See each issue below - septic shock has resolved at this time   VRE faecium UTI v/s colonization  Follow clinically w/o change in abx    Chronic adrenal insufficiency Cont maintenance steroid dosing   Chronic combined systolic and diastolic CHF no evidence on exam of signif volume overload   Filed Weights   08/03/16 0500 08/04/16 0500 08/05/16 0500  Weight: 60.8 kg (134 lb 0.6 oz) 60.6 kg (133 lb 9.6 oz) 61.3 kg (135 lb 2.3 oz)   Hypokalemia Correct and follow   Protein calorie malnutrition Cont tube feeds via PEG   Anemia of chronic disease and critical illness Hgb stable/improving   Recent Labs Lab 07/30/16 0249 07/31/16 0250 08/01/16 0119 08/02/16 0303 08/04/16 0451  HGB 7.8* 9.1* 9.1* 10.3* 10.1*   Seizures  Strep pneumo meningitis March 2017  CVA  DVT prophylaxis: SQ heparin  Code Status: FULL CODE Family Communication: no family present at time of exam  Disposition Plan: SDU   Consultants:  Palliative Care PCCM  Procedures/Events: 8/08 admitted with unresponsiveness, required intubation 8/09 low cvp, levophed, possible seizure  8/10 not moving rt ext upper and lowers - EEG suppressed background  8/11 MRI brain multifocal acute Lt MCA ischemic infarcts, possible infarct Rt caudate head 8/13 Following commands 8/15 Following Commands, but agitated, Norepi off 8/15 am 8/18 trach  8/23 asp concerns  Antimicrobials:  Vanc 8/8 > 8/10 Unasyn 8/23 > 8/27 Zosyn 8/8 > 8/11 Meropenem 8/11 > 8/18 Oral Vanc 8/12 > Vanc IV 8/24 >  Cefepime 8/27 >  Objective: Blood pressure (!) 143/82, pulse (!) 55, temperature 97.3 F (36.3 C), temperature source Oral, resp. rate 10, height 5\' 8"  (1.727 m), weight 61.3 kg (135 lb 2.3 oz), SpO2 100 %.  Intake/Output Summary (Last 24 hours) at 08/05/16 1019 Last data filed at 08/05/16 [id number]  Gross per 24 hour  Intake           996.16 ml  Output             2825 ml  Net         -1828.84 ml   Filed Weights   08/03/16 0500 08/04/16 0500 08/05/16 0500  Weight: 60.8 kg (134 lb 0.6 oz) 60.6 kg (133 lb 9.6 oz) 61.3 kg (135 lb 2.3 oz)     Examination: General: No acute respiratory distress - sedate  Lungs: Poor air movement bilateral bases - no wheezing Cardiovascular: Regular rate and rhythm without murmur  Abdomen: Nontender, nondistended, soft, bowel sounds positive, no rebound, no ascites, no appreciable mass - PEG in place  Extremities: No significant cyanosis, clubbing, edema bilateral lower extremities  CBC:  Recent Labs Lab 07/30/16 0249 07/31/16 0250 08/01/16 0119 08/02/16 0303 08/04/16 0451  WBC 11.3* 7.8 7.9 6.3 9.7  HGB 7.8* 9.1* 9.1* 10.3* 10.1*  HCT 24.5* 28.5* 28.0* 31.0* 31.2*  MCV 90.7 89.1 87.2 88.3 89.1  PLT 303 309 266 237 308   Basic Metabolic Panel:  Recent Labs Lab 07/30/16 0249 07/31/16 0250 07/31/16 1337 08/01/16 0119 08/02/16 0303 08/04/16 0451  NA 145 146*  --  143 142 144  K 3.4* 2.8* 4.0 2.9* 3.6 3.0*  CL 112* 110  --  107 103 100*  CO2 22 25  --  26 29 36*  GLUCOSE 112* 96  --  119* 112* 97  BUN 16 18  --  16 17 14   CREATININE 0.68 0.67  --  0.61 0.58* 0.63  CALCIUM 8.7* 8.6*  --  8.6* 8.5* 8.7*  MG  --   --  1.9 1.7 2.3  --    GFR: Estimated Creatinine Clearance: 94.7 mL/min (by C-G formula based on SCr of 0.8 mg/dL).  Liver Function Tests:  Recent Labs Lab 08/02/16 0303 08/04/16 0451  AST 17 14*  ALT 13* 12*  ALKPHOS 47 51  BILITOT 0.3 0.3  PROT 5.2* 5.5*  ALBUMIN 2.0* 2.1*    Coagulation Profile: No results for input(s): INR, PROTIME in the last 168 hours.   HbA1C: Hgb A1c MFr Bld  Date/Time Value Ref Range Status  05/20/2016 04:20 AM 5.3 4.8 - 5.6 % Final    Comment:    (NOTE)         Pre-diabetes: 5.7 - 6.4         Diabetes: >6.4         Glycemic control for adults with diabetes: <7.0   03/27/2016 05:35 AM 5.8 (H) 4.8 - 5.6 % Final    Comment:    (NOTE)         Pre-diabetes: 5.7 - 6.4         Diabetes: >6.4         Glycemic control for adults with diabetes: <7.0     CBG:  Recent Labs Lab 08/04/16 1637 08/04/16 2039  08/04/16 2331 08/05/16 0520 08/05/16 0800  GLUCAP 80 95 94 102* 99    Recent Results (from the past 240 hour(s))  Urine culture     Status: Abnormal   Collection Time: 07/27/16 11:15 AM  Result Value Ref Range Status   Specimen Description URINE, RANDOM  Final   Special Requests NONE  Final   Culture (A)  Final    >=100,000 COLONIES/mL ENTEROCOCCUS FAECIUM VANCOMYCIN RESISTANT ENTEROCOCCUS  Report Status 07/30/2016 FINAL  Final   Organism ID, Bacteria ENTEROCOCCUS FAECIUM (A)  Final      Susceptibility   Enterococcus faecium - MIC*    AMPICILLIN >=32 RESISTANT Resistant     LEVOFLOXACIN >=8 RESISTANT Resistant     NITROFURANTOIN 128 RESISTANT Resistant     VANCOMYCIN >=32 RESISTANT Resistant     LINEZOLID 2 SENSITIVE Sensitive     * >=100,000 COLONIES/mL ENTEROCOCCUS FAECIUM  Culture, respiratory (NON-Expectorated)     Status: None   Collection Time: 07/27/16 11:15 AM  Result Value Ref Range Status   Specimen Description TRACHEAL ASPIRATE  Final   Special Requests NONE  Final   Gram Stain   Final    MODERATE WBC PRESENT, PREDOMINANTLY PMN MODERATE GRAM POSITIVE COCCI IN PAIRS IN CLUSTERS FEW GRAM POSITIVE RODS RARE GRAM NEGATIVE RODS    Culture   Final    ABUNDANT METHICILLIN RESISTANT STAPHYLOCOCCUS AUREUS ABUNDANT ENTEROBACTER SPECIES    Report Status 07/30/2016 FINAL  Final   Organism ID, Bacteria METHICILLIN RESISTANT STAPHYLOCOCCUS AUREUS  Final   Organism ID, Bacteria ENTEROBACTER SPECIES  Final      Susceptibility   Enterobacter species - MIC*    CEFAZOLIN >=64 RESISTANT Resistant     CEFEPIME 4 SENSITIVE Sensitive     CEFTAZIDIME >=64 RESISTANT Resistant     CEFTRIAXONE >=64 RESISTANT Resistant     CIPROFLOXACIN 1 SENSITIVE Sensitive     GENTAMICIN >=16 RESISTANT Resistant     IMIPENEM 1 SENSITIVE Sensitive     TRIMETH/SULFA <=20 SENSITIVE Sensitive     PIP/TAZO >=128 RESISTANT Resistant     * ABUNDANT ENTEROBACTER SPECIES   Methicillin resistant  staphylococcus aureus - MIC*    CIPROFLOXACIN >=8 RESISTANT Resistant     ERYTHROMYCIN <=0.25 SENSITIVE Sensitive     GENTAMICIN <=0.5 SENSITIVE Sensitive     OXACILLIN >=4 RESISTANT Resistant     TETRACYCLINE <=1 SENSITIVE Sensitive     VANCOMYCIN <=0.5 SENSITIVE Sensitive     TRIMETH/SULFA <=10 SENSITIVE Sensitive     CLINDAMYCIN <=0.25 SENSITIVE Sensitive     RIFAMPIN <=0.5 SENSITIVE Sensitive     Inducible Clindamycin NEGATIVE Sensitive     * ABUNDANT METHICILLIN RESISTANT STAPHYLOCOCCUS AUREUS  Culture, blood (Routine X 2) w Reflex to ID Panel     Status: Abnormal   Collection Time: 07/27/16 12:23 PM  Result Value Ref Range Status   Specimen Description BLOOD RIGHT ARM  Final   Special Requests BOTTLES DRAWN AEROBIC AND ANAEROBIC 5 CC  Final   Culture  Setup Time   Final    GRAM POSITIVE COCCI IN CLUSTERS AEROBIC BOTTLE ONLY CRITICAL RESULT CALLED TO, READ BACK BY AND VERIFIED WITH: M. Bitonti Pharm.D. 11:30 07/28/16 ([email or web address])    Culture METHICILLIN RESISTANT STAPHYLOCOCCUS AUREUS (A)  Final   Report Status 07/30/2016 FINAL  Final   Organism ID, Bacteria METHICILLIN RESISTANT STAPHYLOCOCCUS AUREUS  Final      Susceptibility   Methicillin resistant staphylococcus aureus - MIC*    CIPROFLOXACIN >=8 RESISTANT Resistant     ERYTHROMYCIN <=0.25 SENSITIVE Sensitive     GENTAMICIN <=0.5 SENSITIVE Sensitive     OXACILLIN >=4 RESISTANT Resistant     TETRACYCLINE <=1 SENSITIVE Sensitive     VANCOMYCIN <=0.5 SENSITIVE Sensitive     TRIMETH/SULFA <=10 SENSITIVE Sensitive     CLINDAMYCIN <=0.25 SENSITIVE Sensitive     RIFAMPIN <=0.5 SENSITIVE Sensitive     Inducible Clindamycin NEGATIVE Sensitive     * METHICILLIN RESISTANT STAPHYLOCOCCUS  AUREUS  Culture, blood (Routine X 2) w Reflex to ID Panel     Status: None   Collection Time: 07/27/16 12:23 PM  Result Value Ref Range Status   Specimen Description BLOOD RIGHT ARM  Final   Special Requests BOTTLES DRAWN AEROBIC AND ANAEROBIC 5 CC   Final   Culture NO GROWTH 7 DAYS  Final   Report Status 08/03/2016 FINAL  Final  Blood Culture ID Panel (Reflexed)     Status: Abnormal   Collection Time: 07/27/16 12:23 PM  Result Value Ref Range Status   Enterococcus species NOT DETECTED NOT DETECTED Final   Listeria monocytogenes NOT DETECTED NOT DETECTED Final   Staphylococcus species DETECTED (A) NOT DETECTED Final    Comment: CRITICAL RESULT CALLED TO, READ BACK BY AND VERIFIED WITH: M. Bitonti Pharm.D. 11:30 07/28/16 ([email or web address])    Staphylococcus aureus DETECTED (A) NOT DETECTED Final    Comment: CRITICAL RESULT CALLED TO, READ BACK BY AND VERIFIED WITH: M. Bitonti Pharm.D. 11:30 07/28/16  (wilsonm)    Methicillin resistance DETECTED (A) NOT DETECTED Final    Comment: CRITICAL RESULT CALLED TO, READ BACK BY AND VERIFIED WITH: M. Bitonti Pharm.D. 11:30 07/28/16 (wilsonm)    Streptococcus species NOT DETECTED NOT DETECTED Final   Streptococcus agalactiae NOT DETECTED NOT DETECTED Final   Streptococcus pneumoniae NOT DETECTED NOT DETECTED Final   Streptococcus pyogenes NOT DETECTED NOT DETECTED Final   Acinetobacter baumannii NOT DETECTED NOT DETECTED Final   Enterobacteriaceae species NOT DETECTED NOT DETECTED Final   Enterobacter cloacae complex NOT DETECTED NOT DETECTED Final   Escherichia coli NOT DETECTED NOT DETECTED Final   Klebsiella oxytoca NOT DETECTED NOT DETECTED Final   Klebsiella pneumoniae NOT DETECTED NOT DETECTED Final   Proteus species NOT DETECTED NOT DETECTED Final   Serratia marcescens NOT DETECTED NOT DETECTED Final   Haemophilus influenzae NOT DETECTED NOT DETECTED Final   Neisseria meningitidis NOT DETECTED NOT DETECTED Final   Pseudomonas aeruginosa NOT DETECTED NOT DETECTED Final   Candida albicans NOT DETECTED NOT DETECTED Final   Candida glabrata NOT DETECTED NOT DETECTED Final   Candida krusei NOT DETECTED NOT DETECTED Final   Candida parapsilosis NOT DETECTED NOT DETECTED Final   Candida tropicalis  NOT DETECTED NOT DETECTED Final  Culture, blood (routine x 2)     Status: None   Collection Time: 07/28/16  6:16 PM  Result Value Ref Range Status   Specimen Description BLOOD LEFT ANTECUBITAL  Final   Special Requests BOTTLES DRAWN AEROBIC AND ANAEROBIC 10CC EA  Final   Culture NO GROWTH 5 DAYS  Final   Report Status 08/02/2016 FINAL  Final  Culture, blood (routine x 2)     Status: Abnormal   Collection Time: 07/28/16  6:24 PM  Result Value Ref Range Status   Specimen Description BLOOD RIGHT ANTECUBITAL  Final   Special Requests IN PEDIATRIC BOTTLE 1.5CC  Final   Culture  Setup Time   Final    GRAM POSITIVE COCCI IN CLUSTERS AEROBIC BOTTLE ONLY CRITICAL RESULT CALLED TO, READ BACK BY AND VERIFIED WITH: A MYER 07/29/16 @ 1841 M VESTAL    Culture (A)  Final    STAPHYLOCOCCUS AUREUS SUSCEPTIBILITIES PERFORMED ON PREVIOUS CULTURE WITHIN THE LAST 5 DAYS.    Report Status 07/31/2016 FINAL  Final  Culture, Urine     Status: Abnormal   Collection Time: 07/31/16  7:00 PM  Result Value Ref Range Status   Specimen Description URINE, CLEAN CATCH  Final   Special  Requests NONE  Final   Culture >=100,000 COLONIES/mL YEAST (A)  Final   Report Status 08/03/2016 FINAL  Final  Culture, blood (routine x 2)     Status: None (Preliminary result)   Collection Time: 08/03/16 12:38 PM  Result Value Ref Range Status   Specimen Description BLOOD LEFT ARM  Final   Special Requests IN PEDIATRIC BOTTLE 2.5CC  Final   Culture  Setup Time   Final    GRAM POSITIVE COCCI IN CLUSTERS IN PEDIATRIC BOTTLE    Culture NO GROWTH 1 DAY  Final   Report Status PENDING  Incomplete  Culture, blood (routine x 2)     Status: None (Preliminary result)   Collection Time: 08/03/16  4:00 PM  Result Value Ref Range Status   Specimen Description BLOOD LEFT ARM  Final   Special Requests IN PEDIATRIC BOTTLE 2.5CC  Final   Culture NO GROWTH < 24 HOURS  Final   Report Status PENDING  Incomplete     Scheduled Meds: .  aspirin  325 mg Per Tube Daily  . atorvastatin  20 mg Per Tube q1800  . ceFEPime (MAXIPIME) IV  2 g Intravenous Q8H  . chlorhexidine  15 mL Mouth Rinse BID  . famotidine  20 mg Per Tube BID  . fludrocortisone  0.1 mg Per Tube Daily  . folic acid  1 mg Per Tube Daily  . heparin subcutaneous  5,000 Units Subcutaneous Q8H  . hydrocortisone  50 mg Per Tube Q6H  . insulin aspart  0-20 Units Subcutaneous Q4H  . levETIRAcetam  1,500 mg Per Tube BID  . mouth rinse  15 mL Mouth Rinse q12n4p  . QUEtiapine  75 mg Per Tube BID  . thiamine  100 mg Per Tube Daily  . Valproate Sodium  750 mg Per Tube Q8H  . vancomycin  500 mg Per Tube Q6H  . vancomycin  1,000 mg Intravenous Q12H    LOS: 24 days   Lonia Blood, MD [facility] Office  [phone] Pager - Text Page per Loretha Stapler as per below:  On-Call/Text Page:      [email or web address]      password [email or web address]  If 7PM-7AM, please contact night-coverage [email or web address] Password [email or web address] 08/05/2016, 10:19 AM

## 2016-08-05 NOTE — Progress Notes (Signed)
Daily Progress Note   Patient Name: Dale [name]       Date: 08/05/2016 DOB: September 10, 1966  Age: 50 y.o. MRN#: [id number] Attending Physician: Lonia Blood, MD Primary Care Physician: No primary care provider on file. Admit Date: 07/12/2016  Reason for Consultation/Follow-up: Establishing goals of care  Subjective: Patient seen and examined this morning. Remains unable to dissipate in conversation.  His bedside nurse was able to reach patient's wife and I was able to discuss with her this morning regarding goals moving forward. See details below.  Length of Stay: 24  Current Medications: Scheduled Meds:  . aspirin  325 mg Per Tube Daily  . atorvastatin  20 mg Per Tube q1800  . ceFEPime (MAXIPIME) IV  2 g Intravenous Q8H  . chlorhexidine  15 mL Mouth Rinse BID  . famotidine  20 mg Per Tube BID  . fludrocortisone  0.1 mg Per Tube Daily  . folic acid  1 mg Per Tube Daily  . heparin subcutaneous  5,000 Units Subcutaneous Q8H  . hydrocortisone  50 mg Per Tube TID  . insulin aspart  0-20 Units Subcutaneous Q4H  . levETIRAcetam  1,500 mg Per Tube BID  . mouth rinse  15 mL Mouth Rinse q12n4p  . potassium chloride  40 mEq Per Tube BID  . QUEtiapine  75 mg Per Tube BID  . thiamine  100 mg Per Tube Daily  . Valproate Sodium  750 mg Per Tube Q8H  . vancomycin  500 mg Per Tube Q6H  . vancomycin  1,000 mg Intravenous Q12H    Continuous Infusions: . sodium chloride 10 mL/hr at 08/05/16 0637  . feeding supplement (VITAL AF 1.2 CAL) 1,000 mL (08/04/16 2247)    PRN Meds: acetaminophen (TYLENOL) oral liquid 160 mg/5 mL, albuterol  Physical Exam         General: No acute respiratory distress - sedate  Lungs: Poor air movement bilateral bases - no wheezing Cardiovascular: Regular rate and  rhythm without murmur  Abdomen: Nontender, nondistended, soft, bowel sounds positive, no rebound, no ascites, no appreciable mass - PEG in place  Extremities: No significant cyanosis, clubbing, edema bilateral lower extremities  Vital Signs: BP (!) 145/84   Pulse 69   Temp 97.5 F (36.4 C) (Oral)   Resp (!) 9   Ht 5'  8" (1.727 m)   Wt 61.3 kg (135 lb 2.3 oz)   SpO2 98%   BMI 20.55 kg/m  SpO2: SpO2: 98 % O2 Device: O2 Device: Tracheostomy Collar O2 Flow Rate: O2 Flow Rate (L/min): 5 L/min  Intake/output summary:  Intake/Output Summary (Last 24 hours) at 08/05/16 1551 Last data filed at 08/05/16 1504  Gross per 24 hour  Intake           696.16 ml  Output             3525 ml  Net         -2828.84 ml   LBM: Last BM Date: 08/04/16 Baseline Weight: Weight: 64.4 kg (141 lb 15.6 oz) Most recent weight: Weight: 61.3 kg (135 lb 2.3 oz)       Palliative Assessment/Data: 20%   Flowsheet Rows   Flowsheet Row Most Recent Value  Intake Tab  Referral Department  Hospitalist  Unit at Time of Referral  Intermediate Care Unit  Palliative Care Primary Diagnosis  Neurology  Date Notified  07/30/16  Palliative Care Type  Not seen  Reason Not Seen  Patient/Family declined  Reason for referral  Clarify Goals of Care  Date of Admission  07/12/16  # of days IP prior to Palliative referral  18  Clinical Assessment  Psychosocial & Spiritual Assessment  Palliative Care Outcomes      Patient Active Problem List   Diagnosis Date Noted  . HCAP (healthcare-associated pneumonia)   . Pressure ulcer 07/29/2016  . MRSA bacteremia 07/28/2016  . C. difficile colitis 07/28/2016  . Aspiration pneumonia (HCC) 07/28/2016  . Syphilis 07/28/2016  . New onset a-fib (HCC)   . Encephalopathy 07/12/2016  . Arterial hypotension   . Adrenal insufficiency (Addison's disease) (HCC)   . Bradycardia   . Hypoglycemia 07/02/2016  . Seizures (HCC)   . HLD (hyperlipidemia) 05/04/2016  . Dysphagia  05/04/2016  . Moderate protein-calorie malnutrition (HCC)   . Palliative care encounter   . Cerebral thrombosis with cerebral infarction 03/25/2016  . Cerebrovascular accident (CVA) (HCC)   . Chronic combined systolic and diastolic CHF (congestive heart failure) (HCC)   . Hypothermia 03/20/2016  . NSTEMI (non-ST elevated myocardial infarction) (HCC) 03/14/2016  . Tracheostomy status (HCC)   . Acute on chronic respiratory failure with hypoxia (HCC) 03/03/2016  . Septic shock (HCC)   . Cardiogenic shock (HCC)   . Cardiac arrest (HCC)   . Altered mental state   . Homeless 02/16/2016  . History of bacterial meningitis 02/16/2016  . Tobacco abuse 02/16/2016  . ETOH abuse   . Substance abuse   . Acute respiratory failure  [facility](HCC)     Palliative Care Assessment & Plan   Recommendations/Plan: I was able to reach Ms. [name] via phone this morning with assistance of his bedside nurse in order to discuss care plan moving forward for [name].  We reviewed again his clinical course this point in time as well as the fact that he is going to continue to have decline regardless interventions provided in the future.   Talked about the goal of medical care being to add time and quality to one's life and based on his prognosis we're likely to add suffering without possibility of any real benefit by increasing aggressiveness of care as he continues to decline. I recommended to spouse that she elect DNR, and comfort care but she again felt as though she could not do this without support from his children.  This is not  something that his children support, nor is is something she thinks his children would ever support.  She reports understanding that he is going to die but would not reconsider his CODE STATUS, "for the sake of the family." She states that multiple family members have expressed that they do not feel it is appropriate to make him DO NOT RESUSCITATE and there will be a lot of angst and anger  amongst the family that will separate them if this is changed.  I offered again to meet with her and any family members  would like to discuss his clinical course, prognosis, and pathways forward. She reports appreciating offer but is not interested in meeting further with our team at this time. She states that she is not going to change his CODE STATUS or care plan at any point in the future.  Patient's wife has our number and will call if we can be of further assistance.  Code Status:    Code Status Orders        Start     Ordered   07/12/16 2238  Full code  Continuous     07/12/16 2238    Code Status History    Date Active Date Inactive Code Status Order ID Comments User Context   06/16/2016 10:27 PM 07/11/2016  6:44 PM Full Code [id number]  Kalman Shan, MD ED   05/20/2016  1:54 AM 06/01/2016  9:21 PM Full Code [id number]  Roslynn Amble, MD ED   05/19/2016  8:48 PM 05/20/2016  1:54 AM Full Code [id number]  Hillary Bow, DO ED   04/27/2016 11:23 PM 04/29/2016 10:36 PM Full Code [id number]  Alberteen Sam, MD Inpatient   03/20/2016  9:35 PM 04/06/2016  1:47 AM Full Code [id number]  Orville Govern, MD ED   02/17/2016  1:31 PM 03/10/2016  6:52 PM DNR [id number]  Nelda Bucks, MD Inpatient   02/16/2016  7:13 PM 02/17/2016  1:31 PM Full Code [id number]  Rahul Astrid Drafts, PA-C ED       Prognosis:   Unable to determine  Discharge Planning:  Skilled Nursing Facility for rehab with Palliative care service follow-up  Care plan was discussed with patient wife, Dr. Sharon Seller  Thank you for allowing the Palliative Medicine Team to assist in the care of this patient.   Time In: 1000 Time Out: 1030 Total Time 30 Prolonged Time Billed No      Greater than 50%  of this time was spent counseling and coordinating care related to the above assessment and plan.  Romie Minus, MD  Please contact Palliative Medicine Team phone at [phone] for questions and concerns.   Romie Minus,  MD [facility] Palliative Medicine Team [phone]

## 2016-08-05 NOTE — Progress Notes (Addendum)
Nutrition Follow-up   DOCUMENTATION CODES:   Severe malnutrition in context of chronic illness  INTERVENTION:    Continue Vital AF 1.2 at 60 ml/h to provide 1440 ml, 1728 kcal, 108 gm protein, 1167 ml free water daily.  NUTRITION DIAGNOSIS:   Malnutrition (Severe) related to chronic illness as evidenced by severe depletion of body fat, severe depletion of muscle mass.  Ongoing  GOAL:   Patient will meet greater than or equal to 90% of their needs  Met  MONITOR:   TF tolerance, Skin, I & O's, Vent status, Labs, Weight trends  ASSESSMENT:   50 y.o. M with complex medical history and multiple recent admissions (strep pneumo meningitis, cardiac arrest, cardiogenic shock, ARDS, CVA, seizures) resides in nursing home, admitted again 8/8 with unresponsiveness and inability to protect airway, intubated in ED.  Hypotensive and hypothermic but has this chronically due to adrenal insufficiency.   Patient currently on ATC. S/p G-tube placement per IR 8/28. Palliative Care Team following. Vital AF 1.2 formula infusing at goal rate of 60 ml/hr providing 1728 kcal, 108 gm protein, 1167 ml free water daily. Disposition: SNF.  Diet Order:  Diet NPO time specified  Skin:  Reviewed, no issues (pressure ulcers have healed)  Last BM:  8/31  Height:   Ht Readings from Last 1 Encounters:  07/22/16 '5\' 8"'$  (1.727 m)    Weight:   Wt Readings from Last 1 Encounters:  08/05/16 135 lb 2.3 oz (61.3 kg)  07/12/16 123 lb 7.3 oz (56 kg)  Ideal Body Weight:  70 kg  BMI:  Body mass index is 20.55 kg/m.  Estimated Nutritional Needs:   Kcal:  1673  Protein:  90-115 grams  Fluid:  > 1.9 L/day  EDUCATION NEEDS:   No education needs identified at this time  Arthur Holms, RD, LDN Pager #: [phone] After-Hours Pager #: [phone]

## 2016-08-05 NOTE — Progress Notes (Signed)
Mrs. Mickeal SkinnerShirley Pletz quick contact is her work number as listed in the emergency contact, [phone].

## 2016-08-06 LAB — GLUCOSE, CAPILLARY
GLUCOSE-CAPILLARY: 80 mg/dL (ref 65–99)
GLUCOSE-CAPILLARY: 114 mg/dL — AB (ref 65–99)
GLUCOSE-CAPILLARY: 71 mg/dL (ref 65–99)
Glucose-Capillary: 89 mg/dL (ref 65–99)
Glucose-Capillary: 77 mg/dL (ref 65–99)
Glucose-Capillary: 63 mg/dL — ABNORMAL LOW (ref 65–99)
Glucose-Capillary: 77 mg/dL (ref 65–99)

## 2016-08-06 LAB — BASIC METABOLIC PANEL
ANION GAP: 10 (ref 5–15)
BUN: 15 mg/dL (ref 6–20)
CALCIUM: 9.1 mg/dL (ref 8.9–10.3)
CHLORIDE: 101 mmol/L (ref 101–111)
CO2: 34 mmol/L — AB (ref 22–32)
Creatinine, Ser: 0.61 mg/dL (ref 0.61–1.24)
GFR calc non Af Amer: >60 mL/min (ref >60)
Glucose, Bld: 96 mg/dL (ref 65–99)
Potassium: 3.8 mmol/L (ref 3.5–5.1)
Sodium: 145 mmol/L (ref 135–145)

## 2016-08-06 MED ORDER — DEXTROSE 50 % IV SOLN
1.0000 | Freq: Once | INTRAVENOUS | Status: AC
  Administered 2016-08-06: 50 mL via INTRAVENOUS

## 2016-08-06 MED ORDER — QUETIAPINE FUMARATE 50 MG PO TABS
50.0000 mg | ORAL_TABLET | Freq: Two times a day (BID) | ORAL | Status: DC
  Administered 2016-08-06 – 2016-08-10 (×8): 50 mg
  Filled 2016-08-06 (×8): qty 1

## 2016-08-06 MED ORDER — DEXTROSE 50 % IV SOLN
INTRAVENOUS | Status: AC
  Filled 2016-08-06: qty 50

## 2016-08-06 MED ORDER — VITAL AF 1.2 CAL PO LIQD
1000.0000 mL | ORAL | Status: DC
  Administered 2016-08-06 – 2016-08-09 (×5): 1000 mL
  Filled 2016-08-06 (×8): qty 1000

## 2016-08-06 NOTE — Progress Notes (Signed)
Received call from lab regarding 8/30 Blood Cultures having 1/2 GPC growing. No BCID run.  Remains on vancomycin and cefepime for previous positive cultures.  Text-paged Dr. Sharon SellerMcClung that no changes to antibiotics required at this time. A change in LOT may need to be considered with newly positive repeat cultures.  Leanthony Rhett D. Willia Genrich, PharmD, BCPS Clinical Pharmacist Pager: [phone] 08/06/2016 8:52 AM

## 2016-08-06 NOTE — Progress Notes (Signed)
Water bottle and trach set up changed

## 2016-08-06 NOTE — Progress Notes (Signed)
[place] TEAM 1 - Stepdown/ICU TEAM  Dale [name]  ZOX:[id number] DOB: Sep 05, 1966 DOA: 07/12/2016 PCP: No primary care provider on file.    Brief Narrative:  50 year old M who presented from [facility] w/ a Hx of multiple admissions since April 2017, Depression, Dementia; Seizures; Streptococcal Meningoencephalitis/Meningitis, Cerebral thrombosis with cerebral infarction; Cerebral septic emboli, NSTEMI, Cardiac arrest w/ Cardiogenic shock; Chronic Systolic and diastolic CHF, Acute respiratory failure with hypoxia; Substance abuse; and ETOH abuse who was admitted again 8/08 with altered mental status and an inability to protect his airway. He was hypotensive and hypothermic from UTI, acute resp failure, and severe delirium.  Subjective: Appears to be resting comfortably.  No evidence of respiratory distress or uncontrolled pain.  Remains non-communicative.  Has been somewhat more alert this AM per RN.    Assessment & Plan:  Encephalopathy - Subacute CVA extension  MRI brain 8/29 noted enlarging area of L MCA infract, but this appears subacute and I am not convinced it fits the timeframe to fully explain his AMS, though it conceivably could - cont to minimize sedating meds  Acute on chronic hypoxic respiratory failure  -due to inability to protect airway -hx trach March 2017 (s/p decannulation) -appears to be stable on trach collar only - to have trach changed to #6 cuffless today - if remains stable over weekend should be ready to return to SNF on Monday   MRSA bacteremia  abx per ID - [name] cx 8/24 again 2/2 for MRSA, and f/u cx 8/30 now +for Coag Neg Staph - cont IV Vanc through 08/18/2016 as per ID   Enterobacter & MRSA HCAP Remains on abx coverage as directed by ID - to cont cefepime through 08/08/2016  C diff colitis Cont enteral vanc through 08/15/2016  Septic shock due to multiple infections See each issue below - septic shock has resolved at this time   VRE faecium UTI v/s  colonization  Follow clinically   Chronic adrenal insufficiency Cont maintenance steroid dosing   Chronic combined systolic and diastolic CHF no evidence on exam of signif volume overload - weight stable   Filed Weights   08/04/16 0500 08/05/16 0500 08/06/16 0500  Weight: 60.6 kg (133 lb 9.6 oz) 61.3 kg (135 lb 2.3 oz) 59.2 kg (130 lb 8.2 oz)   Hypokalemia Corrected   Recent Labs Lab 07/31/16 1337 08/01/16 0119 08/02/16 0303 08/04/16 0451 08/06/16 0338  K 4.0 2.9* 3.6 3.0* 3.8    Severe malnutrition in context of chronic illness Cont tube feeds via PEG   Anemia of chronic disease and critical illness Hgb stable/improving   Recent Labs Lab 07/31/16 0250 08/01/16 0119 08/02/16 0303 08/04/16 0451  HGB 9.1* 9.1* 10.3* 10.1*   Seizures  Strep pneumo meningitis March 2017   DVT prophylaxis: SQ heparin  Code Status: FULL CODE Family Communication: no family present at time of exam  Disposition Plan: SDU - plan to return to SNF Monday if remains stable on TC after change to uncuffed trach   Consultants:  Palliative Care PCCM  Procedures/Events: 8/08 admitted with unresponsiveness, required intubation 8/09 low cvp, levophed, possible seizure  8/10 not moving rt ext upper and lowers - EEG suppressed background  8/11 MRI brain multifocal acute Lt MCA ischemic infarcts, possible infarct Rt caudate head 8/13 Following commands 8/15 Following Commands, but agitated, Norepi off 8/15 am 8/18 trach  8/23 asp concerns  Antimicrobials:  Vanc 8/8 > 8/10 Unasyn 8/23 > 8/27 Zosyn 8/8 > 8/11  Meropenem 8/11 > 8/18 Oral Vanc 8/12 > Vanc IV 8/24 >  Cefepime 8/27 >  Objective: [name] pressure 137/79, pulse (!) 49, temperature 97.4 F (36.3 C), temperature source Oral, resp. rate 16, height 5\' 8"  (1.727 m), weight 59.2 kg (130 lb 8.2 oz), SpO2 100 %.  Intake/Output Summary (Last 24 hours) at 08/06/16 1126 Last data filed at 08/06/16 1100  Gross per 24 hour  Intake           2273.83 ml  Output             1700 ml  Net           573.83 ml   Filed Weights   08/04/16 0500 08/05/16 0500 08/06/16 0500  Weight: 60.6 kg (133 lb 9.6 oz) 61.3 kg (135 lb 2.3 oz) 59.2 kg (130 lb 8.2 oz)    Examination: General: No acute respiratory distress Lungs: Poor air movement bilateral bases Cardiovascular: Regular rate and rhythm Abdomen: Nontender, nondistended, soft, bowel sounds positive, no rebound, no ascites, no appreciable mass - PEG in place  Extremities: No significant cyanosis, clubbing, or edema bilateral lower extremities  CBC:  Recent Labs Lab 07/31/16 0250 08/01/16 0119 08/02/16 0303 08/04/16 0451  WBC 7.8 7.9 6.3 9.7  HGB 9.1* 9.1* 10.3* 10.1*  HCT 28.5* 28.0* 31.0* 31.2*  MCV 89.1 87.2 88.3 89.1  PLT 309 266 237 308   Basic Metabolic Panel:  Recent Labs Lab 07/31/16 0250 07/31/16 1337 08/01/16 0119 08/02/16 0303 08/04/16 0451 08/06/16 0338  NA 146*  --  143 142 144 145  K 2.8* 4.0 2.9* 3.6 3.0* 3.8  CL 110  --  107 103 100* 101  CO2 25  --  26 29 36* 34*  GLUCOSE 96  --  119* 112* 97 96  BUN 18  --  16 17 14 15   CREATININE 0.67  --  0.61 0.58* 0.63 0.61  CALCIUM 8.6*  --  8.6* 8.5* 8.7* 9.1  MG  --  1.9 1.7 2.3  --   --    GFR: Estimated Creatinine Clearance: 91.5 mL/min (by C-G formula based on SCr of 0.8 mg/dL).  Liver Function Tests:  Recent Labs Lab 08/02/16 0303 08/04/16 0451  AST 17 14*  ALT 13* 12*  ALKPHOS 47 51  BILITOT 0.3 0.3  PROT 5.2* 5.5*  ALBUMIN 2.0* 2.1*    Coagulation Profile: No results for input(s): INR, PROTIME in the last 168 hours.   HbA1C: Hgb A1c MFr Bld  Date/Time Value Ref Range Status  05/20/2016 04:20 AM 5.3 4.8 - 5.6 % Final    Comment:    (NOTE)         Pre-diabetes: 5.7 - 6.4         Diabetes: >6.4         Glycemic control for adults with diabetes: <7.0   03/27/2016 05:35 AM 5.8 (H) 4.8 - 5.6 % Final    Comment:    (NOTE)         Pre-diabetes: 5.7 - 6.4         Diabetes:  >6.4         Glycemic control for adults with diabetes: <7.0     CBG:  Recent Labs Lab 08/05/16 1634 08/05/16 1950 08/05/16 2323 08/06/16 0430 08/06/16 0743  GLUCAP 80 74 115* 89 80    Recent Results (from the past 240 hour(s))  Culture, [name] (Routine X 2) w Reflex to ID Panel     Status: Abnormal  Collection Time: 07/27/16 12:23 PM  Result Value Ref Range Status   Specimen Description [name] RIGHT ARM  Final   Special Requests BOTTLES DRAWN AEROBIC AND ANAEROBIC 5 CC  Final   Culture  Setup Time   Final    GRAM POSITIVE COCCI IN CLUSTERS AEROBIC BOTTLE ONLY CRITICAL RESULT CALLED TO, READ BACK BY AND VERIFIED WITH: M. Bitonti Pharm.D. 11:30 07/28/16 (wilsonm)    Culture METHICILLIN RESISTANT STAPHYLOCOCCUS AUREUS (A)  Final   Report Status 07/30/2016 FINAL  Final   Organism ID, Bacteria METHICILLIN RESISTANT STAPHYLOCOCCUS AUREUS  Final      Susceptibility   Methicillin resistant staphylococcus aureus - MIC*    CIPROFLOXACIN >=8 RESISTANT Resistant     ERYTHROMYCIN <=0.25 SENSITIVE Sensitive     GENTAMICIN <=0.5 SENSITIVE Sensitive     OXACILLIN >=4 RESISTANT Resistant     TETRACYCLINE <=1 SENSITIVE Sensitive     VANCOMYCIN <=0.5 SENSITIVE Sensitive     TRIMETH/SULFA <=10 SENSITIVE Sensitive     CLINDAMYCIN <=0.25 SENSITIVE Sensitive     RIFAMPIN <=0.5 SENSITIVE Sensitive     Inducible Clindamycin NEGATIVE Sensitive     * METHICILLIN RESISTANT STAPHYLOCOCCUS AUREUS  Culture, [name] (Routine X 2) w Reflex to ID Panel     Status: None   Collection Time: 07/27/16 12:23 PM  Result Value Ref Range Status   Specimen Description [name] RIGHT ARM  Final   Special Requests BOTTLES DRAWN AEROBIC AND ANAEROBIC 5 CC  Final   Culture NO GROWTH 7 DAYS  Final   Report Status 08/03/2016 FINAL  Final  [name] Culture ID Panel (Reflexed)     Status: Abnormal   Collection Time: 07/27/16 12:23 PM  Result Value Ref Range Status   Enterococcus species NOT DETECTED NOT DETECTED Final    Listeria monocytogenes NOT DETECTED NOT DETECTED Final   Staphylococcus species DETECTED (A) NOT DETECTED Final    Comment: CRITICAL RESULT CALLED TO, READ BACK BY AND VERIFIED WITH: M. Bitonti Pharm.D. 11:30 07/28/16 (wilsonm)    Staphylococcus aureus DETECTED (A) NOT DETECTED Final    Comment: CRITICAL RESULT CALLED TO, READ BACK BY AND VERIFIED WITH: M. Bitonti Pharm.D. 11:30 07/28/16  (wilsonm)    Methicillin resistance DETECTED (A) NOT DETECTED Final    Comment: CRITICAL RESULT CALLED TO, READ BACK BY AND VERIFIED WITH: M. Bitonti Pharm.D. 11:30 07/28/16 (wilsonm)    Streptococcus species NOT DETECTED NOT DETECTED Final   Streptococcus agalactiae NOT DETECTED NOT DETECTED Final   Streptococcus pneumoniae NOT DETECTED NOT DETECTED Final   Streptococcus pyogenes NOT DETECTED NOT DETECTED Final   Acinetobacter baumannii NOT DETECTED NOT DETECTED Final   Enterobacteriaceae species NOT DETECTED NOT DETECTED Final   Enterobacter cloacae complex NOT DETECTED NOT DETECTED Final   Escherichia coli NOT DETECTED NOT DETECTED Final   Klebsiella oxytoca NOT DETECTED NOT DETECTED Final   Klebsiella pneumoniae NOT DETECTED NOT DETECTED Final   Proteus species NOT DETECTED NOT DETECTED Final   Serratia marcescens NOT DETECTED NOT DETECTED Final   Haemophilus influenzae NOT DETECTED NOT DETECTED Final   Neisseria meningitidis NOT DETECTED NOT DETECTED Final   Pseudomonas aeruginosa NOT DETECTED NOT DETECTED Final   Candida albicans NOT DETECTED NOT DETECTED Final   Candida glabrata NOT DETECTED NOT DETECTED Final   Candida krusei NOT DETECTED NOT DETECTED Final   Candida parapsilosis NOT DETECTED NOT DETECTED Final   Candida tropicalis NOT DETECTED NOT DETECTED Final  Culture, [name] (routine x 2)     Status: None  Collection Time: 07/28/16  6:16 PM  Result Value Ref Range Status   Specimen Description [name] LEFT ANTECUBITAL  Final   Special Requests BOTTLES DRAWN AEROBIC AND ANAEROBIC 10CC EA   Final   Culture NO GROWTH 5 DAYS  Final   Report Status 08/02/2016 FINAL  Final  Culture, [name] (routine x 2)     Status: Abnormal   Collection Time: 07/28/16  6:24 PM  Result Value Ref Range Status   Specimen Description [name] RIGHT ANTECUBITAL  Final   Special Requests IN PEDIATRIC BOTTLE 1.5CC  Final   Culture  Setup Time   Final    GRAM POSITIVE COCCI IN CLUSTERS AEROBIC BOTTLE ONLY CRITICAL RESULT CALLED TO, READ BACK BY AND VERIFIED WITH: A MYER 07/29/16 @ 1841 M VESTAL    Culture (A)  Final    STAPHYLOCOCCUS AUREUS SUSCEPTIBILITIES PERFORMED ON PREVIOUS CULTURE WITHIN THE LAST 5 DAYS.    Report Status 07/31/2016 FINAL  Final  Culture, Urine     Status: Abnormal   Collection Time: 07/31/16  7:00 PM  Result Value Ref Range Status   Specimen Description URINE, CLEAN CATCH  Final   Special Requests NONE  Final   Culture >=100,000 COLONIES/mL YEAST (A)  Final   Report Status 08/03/2016 FINAL  Final  Culture, [name] (routine x 2)     Status: None (Preliminary result)   Collection Time: 08/03/16 12:38 PM  Result Value Ref Range Status   Specimen Description [name] LEFT ARM  Final   Special Requests IN PEDIATRIC BOTTLE 2.5CC  Final   Culture  Setup Time   Final    GRAM POSITIVE COCCI IN CLUSTERS IN PEDIATRIC BOTTLE CRITICAL RESULT CALLED TO, READ BACK BY AND VERIFIED WITH: L BAJBUS,PHARMD AT 0846 08/06/16 BY L BENFIELD    Culture GRAM POSITIVE COCCI  Final   Report Status PENDING  Incomplete  Culture, [name] (routine x 2)     Status: None (Preliminary result)   Collection Time: 08/03/16  4:00 PM  Result Value Ref Range Status   Specimen Description [name] LEFT ARM  Final   Special Requests IN PEDIATRIC BOTTLE 2.5CC  Final   Culture NO GROWTH 2 DAYS  Final   Report Status PENDING  Incomplete     Scheduled Meds: . aspirin  325 mg Per Tube Daily  . atorvastatin  20 mg Per Tube q1800  . ceFEPime (MAXIPIME) IV  2 g Intravenous Q8H  . chlorhexidine  15 mL Mouth Rinse BID  .  famotidine  20 mg Per Tube BID  . fludrocortisone  0.1 mg Per Tube Daily  . folic acid  1 mg Per Tube Daily  . heparin subcutaneous  5,000 Units Subcutaneous Q8H  . hydrocortisone  50 mg Per Tube TID  . insulin aspart  0-20 Units Subcutaneous Q4H  . levETIRAcetam  1,500 mg Per Tube BID  . mouth rinse  15 mL Mouth Rinse q12n4p  . potassium chloride  40 mEq Per Tube BID  . QUEtiapine  75 mg Per Tube BID  . thiamine  100 mg Per Tube Daily  . Valproate Sodium  750 mg Per Tube Q8H  . vancomycin  500 mg Per Tube Q6H  . vancomycin  1,000 mg Intravenous Q12H    LOS: 25 days   Dale BloodJeffrey T. Makalyn Lennox, MD [facility] Office  [phone] Pager - Text Page per Loretha StaplerAmion as per below:  On-Call/Text Page:      [email or web address]      password [email or web address]  If  7PM-7AM, please contact night-coverage [email or web address] Password [email or web address] 08/06/2016, 11:26 AM

## 2016-08-07 LAB — CULTURE, BLOOD (ROUTINE X 2)

## 2016-08-07 LAB — GLUCOSE, CAPILLARY
GLUCOSE-CAPILLARY: 76 mg/dL (ref 65–99)
GLUCOSE-CAPILLARY: 83 mg/dL (ref 65–99)
Glucose-Capillary: 69 mg/dL (ref 65–99)
Glucose-Capillary: 78 mg/dL (ref 65–99)
Glucose-Capillary: 62 mg/dL — ABNORMAL LOW (ref 65–99)
Glucose-Capillary: 77 mg/dL (ref 65–99)
Glucose-Capillary: 81 mg/dL (ref 65–99)

## 2016-08-07 LAB — VANCOMYCIN, TROUGH: VANCOMYCIN TR: 26 ug/mL — AB (ref 15–20)

## 2016-08-07 MED ORDER — DEXTROSE 50 % IV SOLN
INTRAVENOUS | Status: AC
  Administered 2016-08-07: 25 mL
  Filled 2016-08-07: qty 50

## 2016-08-07 MED ORDER — HALOPERIDOL LACTATE 5 MG/ML IJ SOLN
1.0000 mg | INTRAMUSCULAR | Status: DC | PRN
  Administered 2016-08-07 – 2016-08-10 (×7): 2 mg via INTRAVENOUS
  Filled 2016-08-07 (×7): qty 1

## 2016-08-07 MED ORDER — DEXTROSE-NACL 5-0.9 % IV SOLN
INTRAVENOUS | Status: DC
  Administered 2016-08-07: 30 mL via INTRAVENOUS

## 2016-08-07 MED ORDER — VANCOMYCIN HCL IN DEXTROSE 750-5 MG/150ML-% IV SOLN
750.0000 mg | Freq: Two times a day (BID) | INTRAVENOUS | Status: DC
  Administered 2016-08-07 – 2016-08-10 (×7): 750 mg via INTRAVENOUS
  Filled 2016-08-07 (×8): qty 150

## 2016-08-07 MED ORDER — DEXTROSE 50 % IV SOLN
1.0000 | INTRAVENOUS | Status: DC | PRN
  Administered 2016-08-09: 25 mL via INTRAVENOUS
  Filled 2016-08-07: qty 50

## 2016-08-07 NOTE — Progress Notes (Signed)
Hypoglycemic Event  CBG:62 mg/dl  Treatment: Dextrose 50 25 ml  Symptoms: asymptomatic  Follow-up CBG: Time: CBG Result:77  Possible Reasons for Event: unknown. Tube feeding ongoing  Comments/MD notified protocol followed    Dale [name], Dale [name]

## 2016-08-07 NOTE — Progress Notes (Signed)
[place] TEAM 1 - Stepdown/ICU TEAM  Dale [name]  ZOX:[id number] DOB: [birth year] DOA: 07/12/2016 PCP: No primary care provider on file.    Brief Narrative:  50 year old M who presented from [facility] w/ a Hx of multiple admissions since April 2017, Depression, Dementia; Seizures; Streptococcal Meningoencephalitis/Meningitis, Cerebral thrombosis with cerebral infarction; Cerebral septic emboli, NSTEMI, Cardiac arrest w/ Cardiogenic shock; Chronic Systolic and diastolic CHF, Acute respiratory failure with hypoxia; Substance abuse; and ETOH abuse who was admitted again 8/08 with altered mental status and an inability to protect his airway. He was hypotensive and hypothermic from UTI, acute resp failure, and severe delirium.  Subjective: The patient's eyes are open but he does not follow the examiner or interact in any meaningful fashion.  He has been more agitated today as per his nurse.  His trach has been successfully changed to a #6 cuff less.   Assessment & Plan:  Encephalopathy - Subacute CVA extension  MRI brain 8/29 noted enlarging area of L MCA infract, but this appears subacute and I am not convinced it fits the timeframe to fully explain his AMS, though it conceivably could - w/ minimization of sedating meds pt has become agitated - must resume some med tx for agitation - still no meaningful interaction   Acute on chronic hypoxic respiratory failure  -due to inability to protect airway -hx trach March 2017 (s/p decannulation) -appears to be stable on trach collar only - trach changed to #6 cuffless 9/2 - should be ready to return to SNF on Monday   MRSA bacteremia  abx per ID - blood cx 8/24 again 2/2 for MRSA - f/u cx 8/30 + for Coag Neg Staph in 1 of 2 cultures only - cont IV Vanc through 08/18/2016 as per ID   Enterobacter & MRSA HCAP Remains on abx coverage as directed by ID - to cont cefepime through 08/08/2016  C diff colitis Cont enteral vanc through  08/15/2016  Septic shock due to multiple infections Sseptic shock has resolved at this time   VRE faecium UTI v/s colonization  Follow clinically   Chronic adrenal insufficiency Cont maintenance steroid dosing but attempting to slowly wean to lower dose   Hypoglycemia  Cause unclear - add dextrose in IV and follow trend   Chronic combined systolic and diastolic CHF no evidence on exam of signif volume overload - weight stable   Filed Weights   08/06/16 0500 08/06/16 2300 08/07/16 0452  Weight: 59.2 kg (130 lb 8.2 oz) 64.3 kg (141 lb 12.1 oz) 64 kg (141 lb 1.5 oz)   Hypokalemia Corrected   Recent Labs Lab 08/01/16 0119 08/02/16 0303 08/04/16 0451 08/06/16 0338  K 2.9* 3.6 3.0* 3.8    Severe malnutrition in context of chronic illness Cont tube feeds via PEG   Anemia of chronic disease and critical illness Hgb stable/improving   Recent Labs Lab 08/01/16 0119 08/02/16 0303 08/04/16 0451  HGB 9.1* 10.3* 10.1*   Seizures  Strep pneumo meningitis March 2017   DVT prophylaxis: SQ heparin  Code Status: FULL CODE Family Communication: no family present at time of exam  Disposition Plan: SDU - plan to return to SNF Monday if remains stable on TC after change to uncuffed trach   Consultants:  Palliative Care PCCM  Procedures/Events: 8/08 admitted with unresponsiveness, required intubation 8/09 low cvp, levophed, possible seizure  8/10 not moving rt ext upper and lowers - EEG suppressed background  8/11 MRI brain multifocal acute Lt  MCA ischemic infarcts, possible infarct Rt caudate head 8/13 Following commands 8/15 Following Commands, but agitated, Norepi off 8/15 am 8/18 trach  8/23 asp concerns  Antimicrobials:  Vanc 8/8 > 8/10 Unasyn 8/23 > 8/27 Zosyn 8/8 > 8/11 Meropenem 8/11 > 8/18 Oral Vanc 8/12 > Vanc IV 8/24 >  Cefepime 8/27 >  Objective: Blood pressure (!) 136/99, pulse (!) 51, temperature 97.5 F (36.4 C), temperature source Rectal, resp.  rate (!) 9, height 5\' 8"  (1.727 m), weight 64 kg (141 lb 1.5 oz), SpO2 98 %.  Intake/Output Summary (Last 24 hours) at 08/07/16 1418 Last data filed at 08/07/16 0800  Gross per 24 hour  Intake           944.18 ml  Output             1350 ml  Net          -405.82 ml   Filed Weights   08/06/16 0500 08/06/16 2300 08/07/16 0452  Weight: 59.2 kg (130 lb 8.2 oz) 64.3 kg (141 lb 12.1 oz) 64 kg (141 lb 1.5 oz)    Examination: General: No acute respiratory distress Lungs: no focal crackles or wheeze  Cardiovascular: Regular rate and rhythm w/o M  Abdomen: Nontender, nondistended, soft, bowel sounds positive - PEG in place  Extremities: No significant cyanosis, clubbing, edema bilateral lower extremities  CBC:  Recent Labs Lab 08/01/16 0119 08/02/16 0303 08/04/16 0451  WBC 7.9 6.3 9.7  HGB 9.1* 10.3* 10.1*  HCT 28.0* 31.0* 31.2*  MCV 87.2 88.3 89.1  PLT 266 237 308   Basic Metabolic Panel:  Recent Labs Lab 08/01/16 0119 08/02/16 0303 08/04/16 0451 08/06/16 0338  NA 143 142 144 145  K 2.9* 3.6 3.0* 3.8  CL 107 103 100* 101  CO2 26 29 36* 34*  GLUCOSE 119* 112* 97 96  BUN 16 17 14 15   CREATININE 0.61 0.58* 0.63 0.61  CALCIUM 8.6* 8.5* 8.7* 9.1  MG 1.7 2.3  --   --    GFR: Estimated Creatinine Clearance: 98.9 mL/min (by C-G formula based on SCr of 0.8 mg/dL).  Liver Function Tests:  Recent Labs Lab 08/02/16 0303 08/04/16 0451  AST 17 14*  ALT 13* 12*  ALKPHOS 47 51  BILITOT 0.3 0.3  PROT 5.2* 5.5*  ALBUMIN 2.0* 2.1*    HbA1C: Hgb A1c MFr Bld  Date/Time Value Ref Range Status  05/20/2016 04:20 AM 5.3 4.8 - 5.6 % Final    Comment:    (NOTE)         Pre-diabetes: 5.7 - 6.4         Diabetes: >6.4         Glycemic control for adults with diabetes: <7.0   03/27/2016 05:35 AM 5.8 (H) 4.8 - 5.6 % Final    Comment:    (NOTE)         Pre-diabetes: 5.7 - 6.4         Diabetes: >6.4         Glycemic control for adults with diabetes: <7.0      CBG:  Recent Labs Lab 08/07/16 0450 08/07/16 0839 08/07/16 0843 08/07/16 0912 08/07/16 1248  GLUCAP 78 34* 62* 77 76    Recent Results (from the past 240 hour(s))  Culture, blood (routine x 2)     Status: None   Collection Time: 07/28/16  6:16 PM  Result Value Ref Range Status   Specimen Description BLOOD LEFT ANTECUBITAL  Final   Special  Requests BOTTLES DRAWN AEROBIC AND ANAEROBIC 10CC EA  Final   Culture NO GROWTH 5 DAYS  Final   Report Status 08/02/2016 FINAL  Final  Culture, blood (routine x 2)     Status: Abnormal   Collection Time: 07/28/16  6:24 PM  Result Value Ref Range Status   Specimen Description BLOOD RIGHT ANTECUBITAL  Final   Special Requests IN PEDIATRIC BOTTLE 1.5CC  Final   Culture  Setup Time   Final    GRAM POSITIVE COCCI IN CLUSTERS AEROBIC BOTTLE ONLY CRITICAL RESULT CALLED TO, READ BACK BY AND VERIFIED WITH: A MYER 07/29/16 @ 1841 M VESTAL    Culture (A)  Final    STAPHYLOCOCCUS AUREUS SUSCEPTIBILITIES PERFORMED ON PREVIOUS CULTURE WITHIN THE LAST 5 DAYS.    Report Status 07/31/2016 FINAL  Final  Culture, Urine     Status: Abnormal   Collection Time: 07/31/16  7:00 PM  Result Value Ref Range Status   Specimen Description URINE, CLEAN CATCH  Final   Special Requests NONE  Final   Culture >=100,000 COLONIES/mL YEAST (A)  Final   Report Status 08/03/2016 FINAL  Final  Culture, blood (routine x 2)     Status: Abnormal   Collection Time: 08/03/16 12:38 PM  Result Value Ref Range Status   Specimen Description BLOOD LEFT ARM  Final   Special Requests IN PEDIATRIC BOTTLE 2.5CC  Final   Culture  Setup Time   Final    GRAM POSITIVE COCCI IN CLUSTERS IN PEDIATRIC BOTTLE CRITICAL RESULT CALLED TO, READ BACK BY AND VERIFIED WITH: L BAJBUS,PHARMD AT 0846 08/06/16 BY L BENFIELD    Culture (A)  Final    STAPHYLOCOCCUS SPECIES (COAGULASE NEGATIVE) THE SIGNIFICANCE OF ISOLATING THIS ORGANISM FROM A SINGLE SET OF BLOOD CULTURES WHEN MULTIPLE SETS ARE  DRAWN IS UNCERTAIN. PLEASE NOTIFY THE MICROBIOLOGY DEPARTMENT WITHIN ONE WEEK IF SPECIATION AND SENSITIVITIES ARE REQUIRED.    Report Status 08/07/2016 FINAL  Final  Culture, blood (routine x 2)     Status: None (Preliminary result)   Collection Time: 08/03/16  4:00 PM  Result Value Ref Range Status   Specimen Description BLOOD LEFT ARM  Final   Special Requests IN PEDIATRIC BOTTLE 2.5CC  Final   Culture NO GROWTH 3 DAYS  Final   Report Status PENDING  Incomplete     Scheduled Meds: . aspirin  325 mg Per Tube Daily  . atorvastatin  20 mg Per Tube q1800  . ceFEPime (MAXIPIME) IV  2 g Intravenous Q8H  . chlorhexidine  15 mL Mouth Rinse BID  . famotidine  20 mg Per Tube BID  . fludrocortisone  0.1 mg Per Tube Daily  . folic acid  1 mg Per Tube Daily  . heparin subcutaneous  5,000 Units Subcutaneous Q8H  . hydrocortisone  50 mg Per Tube TID  . insulin aspart  0-20 Units Subcutaneous Q4H  . levETIRAcetam  1,500 mg Per Tube BID  . mouth rinse  15 mL Mouth Rinse q12n4p  . QUEtiapine  50 mg Per Tube BID  . thiamine  100 mg Per Tube Daily  . Valproate Sodium  750 mg Per Tube Q8H  . vancomycin  500 mg Per Tube Q6H  . vancomycin  750 mg Intravenous Q12H    LOS: 26 days   Lonia Blood, MD [facility] Office  [phone] Pager - Text Page per Loretha Stapler as per below:  On-Call/Text Page:      [email or web address]  password [email or web address]  If 7PM-7AM, please contact night-coverage [email or web address] Password [email or web address] 08/07/2016, 2:18 PM

## 2016-08-07 NOTE — Progress Notes (Signed)
CRITICAL VALUE ALERT  Critical value received:  vanc trough 26  Date of notification:  08/07/16  Time of notification:  0620  Critical value read back:Yes.    Nurse who received alert:  Tyler PitaBecky Reap  MD notified (1st page):  Montez Moritaarter [facility]   Time of first page:  0622  MD notified (2nd page):  Time of second page:  Responding MD:    Time MD responded:

## 2016-08-07 NOTE — Progress Notes (Signed)
Hypoglycemic Event  CBG: 69  Treatment: D50 IV 25 mL   Symptoms: None  Follow-up CBG: Time:0450 CBG Result:78  Possible Reasons for Event: Unknown  Comments/MD notified:    Dale [name]

## 2016-08-07 NOTE — Progress Notes (Signed)
Pharmacy Antibiotic Note  Dale [name] is a 50 y.o. male admitted on 07/12/2016 with altered mental status.  Pharmacy has been consulted for Vancomycin dosing for MRSA bacteremia/HCAP.   Vancomycin trough this AM is supratherapeutic at 26  Plan: Change Vancomycin 750 mg IV q12h  Height: 5\' 8"  (172.7 cm) Weight: 141 lb 1.5 oz (64 kg) IBW/kg (Calculated) : 68.4  Temp (24hrs), Avg:96.7 F (35.9 C), Min:96.2 F (35.7 C), Max:97.5 F (36.4 C)   Recent Labs Lab 08/01/16 0119  08/02/16 0303 08/04/16 0451 08/06/16 0338 08/07/16 0319  WBC 7.9  --  6.3 9.7  --   --   CREATININE 0.61  --  0.58* 0.63 0.61  --   VANCOTROUGH  --   < >  --  18  --  26*  < > = values in this interval not displayed.  Estimated Creatinine Clearance: 98.9 mL/min (by C-G formula based on SCr of 0.8 mg/dL).    No Known Allergies   Dale [name], Dale [name] 08/07/2016 6:24 AM

## 2016-08-08 LAB — CBC
HCT: 34.7 % — ABNORMAL LOW (ref 39.0–52.0)
HEMOGLOBIN: 10.9 g/dL — AB (ref 13.0–17.0)
MCH: 28.7 pg (ref 26.0–34.0)
MCHC: 31.4 g/dL (ref 30.0–36.0)
MCV: 91.3 fL (ref 78.0–100.0)
Platelets: 302 10*3/uL (ref 150–400)
RBC: 3.8 MIL/uL — AB (ref 4.22–5.81)
RDW: 17.6 % — ABNORMAL HIGH (ref 11.5–15.5)
WBC: 11.6 10*3/uL — AB (ref 4.0–10.5)

## 2016-08-08 LAB — COMPREHENSIVE METABOLIC PANEL
ALK PHOS: 65 U/L (ref 38–126)
ALT: 13 U/L — ABNORMAL LOW (ref 17–63)
ANION GAP: 10 (ref 5–15)
AST: 16 U/L (ref 15–41)
Albumin: 2.5 g/dL — ABNORMAL LOW (ref 3.5–5.0)
BILIRUBIN TOTAL: 0.2 mg/dL — AB (ref 0.3–1.2)
BUN: 17 mg/dL (ref 6–20)
CALCIUM: 9.5 mg/dL (ref 8.9–10.3)
CO2: 36 mmol/L — ABNORMAL HIGH (ref 22–32)
Chloride: 98 mmol/L — ABNORMAL LOW (ref 101–111)
Creatinine, Ser: 0.55 mg/dL — ABNORMAL LOW (ref 0.61–1.24)
Glucose, Bld: 90 mg/dL (ref 65–99)
POTASSIUM: 3.4 mmol/L — AB (ref 3.5–5.1)
Sodium: 144 mmol/L (ref 135–145)
TOTAL PROTEIN: 6.4 g/dL — AB (ref 6.5–8.1)

## 2016-08-08 LAB — CULTURE, BLOOD (ROUTINE X 2): Culture: NO GROWTH

## 2016-08-08 LAB — GLUCOSE, CAPILLARY
GLUCOSE-CAPILLARY: 69 mg/dL (ref 65–99)
GLUCOSE-CAPILLARY: 88 mg/dL (ref 65–99)
GLUCOSE-CAPILLARY: 88 mg/dL (ref 65–99)
Glucose-Capillary: 95 mg/dL (ref 65–99)
Glucose-Capillary: 97 mg/dL (ref 65–99)
Glucose-Capillary: 79 mg/dL (ref 65–99)
Glucose-Capillary: 74 mg/dL (ref 65–99)
Glucose-Capillary: 85 mg/dL (ref 65–99)

## 2016-08-08 MED ORDER — SODIUM CHLORIDE 0.9 % IV SOLN
INTRAVENOUS | Status: DC
  Administered 2016-08-08: 75 mL via INTRAVENOUS
  Administered 2016-08-09: 03:00:00 via INTRAVENOUS

## 2016-08-08 MED ORDER — POTASSIUM CHLORIDE 20 MEQ/15ML (10%) PO SOLN
40.0000 meq | Freq: Three times a day (TID) | ORAL | Status: AC
  Administered 2016-08-08 (×3): 40 meq
  Filled 2016-08-08 (×3): qty 30

## 2016-08-08 MED ORDER — HYDROCORTISONE NA SUCCINATE PF 100 MG IJ SOLR
50.0000 mg | Freq: Once | INTRAMUSCULAR | Status: AC
  Administered 2016-08-08: 50 mg via INTRAVENOUS
  Filled 2016-08-08: qty 2

## 2016-08-08 MED ORDER — HYDROCORTISONE 20 MG PO TABS
100.0000 mg | ORAL_TABLET | Freq: Three times a day (TID) | ORAL | Status: DC
  Administered 2016-08-08 – 2016-08-10 (×6): 100 mg
  Filled 2016-08-08 (×7): qty 5

## 2016-08-08 NOTE — Progress Notes (Signed)
Hypoglycemic Event  CBG: 69  Treatment: reconnect tube feeding  Symptoms: cold  Follow-up CBG: Time: 2000 CBG Result: 74  Possible Reasons for Event: tube feeding tubing disconnected  Comments/MD notified:    Dale [name], Dale BevelsAmanda [name]

## 2016-08-08 NOTE — Progress Notes (Signed)
Pt not stable for DC today- [facility] updated- they will continue to follow for possible admission tomorrow  Burna SisJenna H. Trinia Georgi, [employer] [facility] LPCSWA Clinical Social Worker [phone]

## 2016-08-08 NOTE — Progress Notes (Addendum)
[place] TEAM 1 - Stepdown/ICU TEAM  Dale [name]  ZOX:[id number] DOB: 1966-07-30 DOA: 07/12/2016 PCP: No primary care provider on file.    Brief Narrative:  50 year old M who presented from [facility] w/ a Hx of multiple admissions since April 2017, Depression, Dementia; Seizures; Streptococcal Meningoencephalitis/Meningitis, Cerebral thrombosis with cerebral infarction; Cerebral septic emboli, NSTEMI, Cardiac arrest w/ Cardiogenic shock; Chronic Systolic and diastolic CHF, Acute respiratory failure with hypoxia; Substance abuse; and ETOH abuse who was admitted again 8/08 with altered mental status and an inability to protect his airway. He was hypotensive and hypothermic from UTI, acute resp failure, and severe delirium.  Subjective: The patient developed hypothermia and bradycardia early this morning.  His temperature has improved but has required a warming blanket.  On exam the patient's status does not appear changed.  He is in no acute respiratory distress and there is no evidence of uncontrolled pain.  He does not open his eyes to my exam and does not follow any simple commands.  Assessment & Plan:  Encephalopathy - Subacute CVA extension  MRI brain 8/29 noted enlarging area of L MCA infract, but this appears subacute and I am not convinced it fits the timeframe to fully explain his AMS, though it conceivably could - w/ minimization of sedating meds pt has became agitated - no meaningful interaction - titrating medication to attempt to control agitation  Acute on chronic hypoxic respiratory failure  -due to inability to protect airway -hx trach March 2017 (s/p decannulation) -appears to be stable on trach collar only - trach changed to #6 cuffless 9/2  Hypothermia, hypotension, and bradycardia Unclear etiology - ?autonomic dis-regulation in setting of signif brain injury - wean from warming blanket and follow - remains on very broad abx coverage - return to prior dose of  hydrocortisone, and give one IV dose now   MRSA bacteremia  abx per ID - blood cx 8/24 again 2/2 for MRSA - f/u cx 8/30 + for Coag Neg Staph in 1 of 2 cultures only - cont IV Vanc through 08/18/2016 as per ID   Enterobacter & MRSA HCAP Remains on abx coverage as directed by ID - to cont cefepime through today   C diff colitis Cont enteral vanc through 08/15/2016 - abdom exam w/o change - no evidence of toxic megacolon  VRE faecium UTI v/s colonization  Follow clinically   Chronic adrenal insufficiency Cont maintenance steroid dosing - return to prior high dose and follow   Hypoglycemia  Attempt to wean off dextrose IVF w/ increased steroid dosing today   Chronic combined systolic and diastolic CHF no evidence on exam of signif volume overload - weight stable   Filed Weights   08/06/16 2300 08/07/16 0452 08/08/16 0329  Weight: 64.3 kg (141 lb 12.1 oz) 64 kg (141 lb 1.5 oz) 58.2 kg (128 lb 4.9 oz)   Hypokalemia Correct as needed to goal of 4.0  Recent Labs Lab 08/02/16 0303 08/04/16 0451 08/06/16 0338 08/08/16 0328  K 3.6 3.0* 3.8 3.4*    Severe malnutrition in context of chronic illness Cont tube feeds via PEG   Anemia of chronic disease and critical illness Hgb stable/improving   Recent Labs Lab 08/02/16 0303 08/04/16 0451 08/08/16 0328  HGB 10.3* 10.1* 10.9*   Seizures  Strep pneumo meningitis March 2017   DVT prophylaxis: SQ heparin  Code Status: FULL CODE Family Communication: no family present at time of exam  Disposition Plan: SDU - plan to  return to SNF when stable   Consultants:  Palliative Care PCCM  Procedures/Events: 8/08 admitted with unresponsiveness, required intubation 8/09 low cvp, levophed, possible seizure  8/10 not moving rt ext upper and lowers - EEG suppressed background  8/11 MRI brain multifocal acute Lt MCA ischemic infarcts, possible infarct Rt caudate head 8/13 Following commands 8/15 Following Commands, but agitated,  Norepi off 8/15 am 8/18 trach  8/23 asp concerns  Antimicrobials:  Vanc 8/8 > 8/10 Unasyn 8/23 > 8/27 Zosyn 8/8 > 8/11 Meropenem 8/11 > 8/18 Oral Vanc 8/12 > Vanc IV 8/24 >  Cefepime 8/27 >  Objective: Blood pressure 104/75, pulse (!) 54, temperature 97.5 F (36.4 C), temperature source Oral, resp. rate (!) 9, height 5\' 8"  (1.727 m), weight 58.2 kg (128 lb 4.9 oz), SpO2 100 %.  Intake/Output Summary (Last 24 hours) at 08/08/16 1059 Last data filed at 08/08/16 0800  Gross per 24 hour  Intake             2427 ml  Output             2750 ml  Net             -323 ml   Filed Weights   08/06/16 2300 08/07/16 0452 08/08/16 0329  Weight: 64.3 kg (141 lb 12.1 oz) 64 kg (141 lb 1.5 oz) 58.2 kg (128 lb 4.9 oz)    Examination: General: No acute respiratory distress - obtunded  Lungs: no focal crackles or wheeze - good air movement th/o - mod tan secretions at trach Cardiovascular: Bradycardic but regular without rub or murmur Abdomen: Nontender, thin, nondistended, soft, bowel sounds positive - PEG in place  Extremities: No significant cyanosis, clubbing, or edema bilateral lower extremities  CBC:  Recent Labs Lab 08/02/16 0303 08/04/16 0451 08/08/16 0328  WBC 6.3 9.7 11.6*  HGB 10.3* 10.1* 10.9*  HCT 31.0* 31.2* 34.7*  MCV 88.3 89.1 91.3  PLT 237 308 302   Basic Metabolic Panel:  Recent Labs Lab 08/02/16 0303 08/04/16 0451 08/06/16 0338 08/08/16 0328  NA 142 144 145 144  K 3.6 3.0* 3.8 3.4*  CL 103 100* 101 98*  CO2 29 36* 34* 36*  GLUCOSE 112* 97 96 90  BUN 17 14 15 17   CREATININE 0.58* 0.63 0.61 0.55*  CALCIUM 8.5* 8.7* 9.1 9.5  MG 2.3  --   --   --    GFR: Estimated Creatinine Clearance: 89.9 mL/min (by C-G formula based on SCr of 0.8 mg/dL).  Liver Function Tests:  Recent Labs Lab 08/02/16 0303 08/04/16 0451 08/08/16 0328  AST 17 14* 16  ALT 13* 12* 13*  ALKPHOS 47 51 65  BILITOT 0.3 0.3 0.2*  PROT 5.2* 5.5* 6.4*  ALBUMIN 2.0* 2.1* 2.5*     HbA1C: Hgb A1c MFr Bld  Date/Time Value Ref Range Status  05/20/2016 04:20 AM 5.3 4.8 - 5.6 % Final    Comment:    (NOTE)         Pre-diabetes: 5.7 - 6.4         Diabetes: >6.4         Glycemic control for adults with diabetes: <7.0   03/27/2016 05:35 AM 5.8 (H) 4.8 - 5.6 % Final    Comment:    (NOTE)         Pre-diabetes: 5.7 - 6.4         Diabetes: >6.4         Glycemic control for adults with diabetes: <  7.0     CBG:  Recent Labs Lab 08/07/16 1636 08/07/16 1949 08/07/16 2348 08/08/16 0303 08/08/16 0808  GLUCAP 81 83 88 88 95    Recent Results (from the past 240 hour(s))  Culture, Urine     Status: Abnormal   Collection Time: 07/31/16  7:00 PM  Result Value Ref Range Status   Specimen Description URINE, CLEAN CATCH  Final   Special Requests NONE  Final   Culture >=100,000 COLONIES/mL YEAST (A)  Final   Report Status 08/03/2016 FINAL  Final  Culture, blood (routine x 2)     Status: Abnormal   Collection Time: 08/03/16 12:38 PM  Result Value Ref Range Status   Specimen Description BLOOD LEFT ARM  Final   Special Requests IN PEDIATRIC BOTTLE 2.5CC  Final   Culture  Setup Time   Final    GRAM POSITIVE COCCI IN CLUSTERS IN PEDIATRIC BOTTLE CRITICAL RESULT CALLED TO, READ BACK BY AND VERIFIED WITH: L BAJBUS,PHARMD AT 0846 08/06/16 BY L BENFIELD    Culture (A)  Final    STAPHYLOCOCCUS SPECIES (COAGULASE NEGATIVE) THE SIGNIFICANCE OF ISOLATING THIS ORGANISM FROM A SINGLE SET OF BLOOD CULTURES WHEN MULTIPLE SETS ARE DRAWN IS UNCERTAIN. PLEASE NOTIFY THE MICROBIOLOGY DEPARTMENT WITHIN ONE WEEK IF SPECIATION AND SENSITIVITIES ARE REQUIRED.    Report Status 08/07/2016 FINAL  Final  Culture, blood (routine x 2)     Status: None (Preliminary result)   Collection Time: 08/03/16  4:00 PM  Result Value Ref Range Status   Specimen Description BLOOD LEFT ARM  Final   Special Requests IN PEDIATRIC BOTTLE 2.5CC  Final   Culture NO GROWTH 4 DAYS  Final   Report Status  PENDING  Incomplete     Scheduled Meds: . aspirin  325 mg Per Tube Daily  . atorvastatin  20 mg Per Tube q1800  . ceFEPime (MAXIPIME) IV  2 g Intravenous Q8H  . chlorhexidine  15 mL Mouth Rinse BID  . famotidine  20 mg Per Tube BID  . fludrocortisone  0.1 mg Per Tube Daily  . folic acid  1 mg Per Tube Daily  . heparin subcutaneous  5,000 Units Subcutaneous Q8H  . hydrocortisone  50 mg Per Tube TID  . levETIRAcetam  1,500 mg Per Tube BID  . mouth rinse  15 mL Mouth Rinse q12n4p  . QUEtiapine  50 mg Per Tube BID  . thiamine  100 mg Per Tube Daily  . Valproate Sodium  750 mg Per Tube Q8H  . vancomycin  500 mg Per Tube Q6H  . vancomycin  750 mg Intravenous Q12H    LOS: 27 days   Lonia Blood, MD [facility] Office  [phone] Pager - Text Page per Loretha Stapler as per below:  On-Call/Text Page:      [email or web address]      password [email or web address]  If 7PM-7AM, please contact night-coverage [email or web address] Password [email or web address] 08/08/2016, 10:59 AM

## 2016-08-09 DIAGNOSIS — E46 Unspecified protein-calorie malnutrition: Secondary | ICD-10-CM

## 2016-08-09 DIAGNOSIS — G001 Pneumococcal meningitis: Secondary | ICD-10-CM

## 2016-08-09 DIAGNOSIS — J156 Pneumonia due to other aerobic Gram-negative bacteria: Secondary | ICD-10-CM | POA: Diagnosis present

## 2016-08-09 DIAGNOSIS — J15212 Pneumonia due to Methicillin resistant Staphylococcus aureus: Secondary | ICD-10-CM

## 2016-08-09 DIAGNOSIS — J1569 Pneumonia due to other gram-negative bacteria: Secondary | ICD-10-CM | POA: Diagnosis present

## 2016-08-09 LAB — CBC
HEMATOCRIT: 30.9 % — AB (ref 39.0–52.0)
Hemoglobin: 9.7 g/dL — ABNORMAL LOW (ref 13.0–17.0)
MCH: 28.4 pg (ref 26.0–34.0)
MCHC: 31.4 g/dL (ref 30.0–36.0)
MCV: 90.4 fL (ref 78.0–100.0)
PLATELETS: 265 10*3/uL (ref 150–400)
RBC: 3.42 MIL/uL — ABNORMAL LOW (ref 4.22–5.81)
RDW: 17.7 % — AB (ref 11.5–15.5)
WBC: 12.3 10*3/uL — AB (ref 4.0–10.5)

## 2016-08-09 LAB — COMPREHENSIVE METABOLIC PANEL
ALBUMIN: 2.2 g/dL — AB (ref 3.5–5.0)
ALT: 12 U/L — AB (ref 17–63)
AST: 15 U/L (ref 15–41)
Alkaline Phosphatase: 59 U/L (ref 38–126)
Anion gap: 8 (ref 5–15)
BUN: 18 mg/dL (ref 6–20)
CHLORIDE: 104 mmol/L (ref 101–111)
CO2: 34 mmol/L — ABNORMAL HIGH (ref 22–32)
CREATININE: 0.65 mg/dL (ref 0.61–1.24)
Calcium: 9.1 mg/dL (ref 8.9–10.3)
GFR calc Af Amer: >60 mL/min (ref >60)
GLUCOSE: 108 mg/dL — AB (ref 65–99)
POTASSIUM: 3.9 mmol/L (ref 3.5–5.1)
Sodium: 146 mmol/L — ABNORMAL HIGH (ref 135–145)
Total Bilirubin: 0.5 mg/dL (ref 0.3–1.2)
Total Protein: 5.6 g/dL — ABNORMAL LOW (ref 6.5–8.1)

## 2016-08-09 LAB — GLUCOSE, CAPILLARY
GLUCOSE-CAPILLARY: 109 mg/dL — AB (ref 65–99)
GLUCOSE-CAPILLARY: 121 mg/dL — AB (ref 65–99)
GLUCOSE-CAPILLARY: 55 mg/dL — AB (ref 65–99)
GLUCOSE-CAPILLARY: 110 mg/dL — AB (ref 65–99)
Glucose-Capillary: 34 mg/dL — CL (ref 65–99)
Glucose-Capillary: 98 mg/dL (ref 65–99)

## 2016-08-09 MED ORDER — VANCOMYCIN 50 MG/ML ORAL SOLUTION: 500.0000 mg | Freq: Four times a day (QID) | ORAL | 0 refills | Status: DC

## 2016-08-09 MED ORDER — ALBUTEROL SULFATE (2.5 MG/3ML) 0.083% IN NEBU: 2.5000 mg | INHALATION_SOLUTION | RESPIRATORY_TRACT | 12 refills | Status: AC | PRN

## 2016-08-09 MED ORDER — VANCOMYCIN HCL IN DEXTROSE 750-5 MG/150ML-% IV SOLN: 750.0000 mg | Freq: Two times a day (BID) | INTRAVENOUS | 0 refills | Status: DC

## 2016-08-09 MED ORDER — SODIUM CHLORIDE 0.9% FLUSH: 10.0000 mL | INTRAVENOUS | Status: DC | PRN

## 2016-08-09 MED ORDER — SODIUM CHLORIDE 0.9% FLUSH
10.0000 mL | Freq: Two times a day (BID) | INTRAVENOUS | Status: DC
  Administered 2016-08-09: 10 mL

## 2016-08-09 MED ORDER — ATORVASTATIN CALCIUM 20 MG PO TABS: 20.0000 mg | ORAL_TABLET | Freq: Every day | ORAL | 0 refills | Status: AC

## 2016-08-09 MED ORDER — DEXTROSE-NACL 5-0.45 % IV SOLN
INTRAVENOUS | Status: DC
  Administered 2016-08-09: 75 mL/h via INTRAVENOUS

## 2016-08-09 MED ORDER — ASPIRIN 325 MG PO TABS: 325.0000 mg | ORAL_TABLET | Freq: Every day | ORAL | 0 refills | Status: AC

## 2016-08-09 MED ORDER — HALOPERIDOL LACTATE 5 MG/ML IJ SOLN: 1.0000 mg | INTRAMUSCULAR | 0 refills | Status: DC | PRN

## 2016-08-09 MED ORDER — THIAMINE HCL 100 MG PO TABS: 100.0000 mg | ORAL_TABLET | Freq: Every day | ORAL | 0 refills | Status: AC

## 2016-08-09 MED ORDER — HYDROCORTISONE 20 MG PO TABS: 100.0000 mg | ORAL_TABLET | Freq: Three times a day (TID) | ORAL | 0 refills | Status: DC

## 2016-08-09 MED ORDER — VALPROATE SODIUM 250 MG/5ML PO SOLN: 750.0000 mg | Freq: Three times a day (TID) | ORAL | 0 refills | Status: AC

## 2016-08-09 MED ORDER — FAMOTIDINE 40 MG/5ML PO SUSR: 20.0000 mg | Freq: Two times a day (BID) | ORAL | 0 refills | Status: AC

## 2016-08-09 MED ORDER — FOLIC ACID 1 MG PO TABS: 1.0000 mg | ORAL_TABLET | Freq: Every day | ORAL | 0 refills | Status: AC

## 2016-08-09 MED ORDER — VITAL AF 1.2 CAL PO LIQD: 1000.0000 mL | ORAL | 0 refills | Status: DC

## 2016-08-09 MED ORDER — HEPARIN SODIUM (PORCINE) 5000 UNIT/ML IJ SOLN: 5000.0000 [IU] | Freq: Three times a day (TID) | INTRAMUSCULAR | 0 refills | Status: AC

## 2016-08-09 MED ORDER — QUETIAPINE FUMARATE 50 MG PO TABS: 50.0000 mg | ORAL_TABLET | Freq: Two times a day (BID) | ORAL | 0 refills | Status: DC

## 2016-08-09 MED ORDER — FREE WATER
200.0000 mL | Freq: Three times a day (TID) | Status: DC
  Administered 2016-08-09 – 2016-08-10 (×3): 200 mL

## 2016-08-09 MED ORDER — LEVETIRACETAM 100 MG/ML PO SOLN: 1500.0000 mg | Freq: Two times a day (BID) | ORAL | 12 refills | Status: AC

## 2016-08-09 MED ORDER — FREE WATER: 200.0000 mL | Freq: Three times a day (TID) | 0 refills | Status: AC

## 2016-08-09 MED ORDER — FLUDROCORTISONE ACETATE 0.1 MG PO TABS: 0.1000 mg | ORAL_TABLET | Freq: Every day | ORAL | 0 refills | Status: AC

## 2016-08-09 NOTE — Progress Notes (Addendum)
[facility] and rectal temp 94.9. [facility] notified and discharge cancelled. Bair Hugger applied and tube feed restarted since it was turned off for transport to facility by previous shift. [facility], and wife notified. Will continue to monitor.

## 2016-08-09 NOTE — Discharge Summary (Signed)
Physician Discharge Summary  Dale [name] DQQ:[id number] DOB: 04/12/1966 DOA: 07/12/2016  PCP: No primary care provider on file.  Admit date: 07/12/2016 Discharge date: 08/09/2016  Time spent: 35 minutes  Recommendations for Outpatient Follow-up:   Acute/subacute stroke/ Acute Encephalopathy. -8/29 MRI see results below -Encephalopathy Multifactorial to include CVA/subacute CVA , sedating medication fentanyl and Ativan,Seroquel. After sedating medication discontinued/decreased pt became agitated: no meaningful interaction. Titrated medication to attempt to control agitation  Agitation -Haldol JHE:RDEY for HR<50 or if QT becomes prolonged  Altered mental status -Eyes open but minimally responsive to painful stimuli.  -See acute encephalopathy   Acute on chronic hypoxic respiratory failure/ Enterobacter &MRSA HCAP -due to inability to protect airway. -Hx tracheostomy March 2017 (s/p decannulation.) -Aspiration PNA 8/23 event -trach changed to #6 cuffless 9/2  MRSA bacteremia  -abx per ID - blood cx 8/24 again 2/2 for MRSA - f/u cx 8/30 + for Coag Neg Staph in 1 of 2 cultures only  - cont IV Vanc through 08/18/2016 as per ID   Hypothermia, hypotension, and bradycardia -Unclear etiology most likely Autonomic dysregulation in setting of signif brain injury. -wean from warming blanket as tolerated. -Bradycardia waxes and wanes most likely secondary to Autonomic dysregulation    Seizures, strep pneumo meningitis, CVA. -Continue depakote, keppra  -Continue Seroquel   Septic shock  -Multifactorial from UTI, HCAP, C diff colitis  - resolved. -Continue antibiotics as below per infectious disease   C diff colitis -Cont enteral  vancomycin as below per infectious disease  Chronic combined systolic and diastolic CHF, HLD. -Continue florinef. -Hydrocortisone 100 mg TID  -Strict in and out's admission + 5.3 L -Daily weight. Filed Weights   08/07/16 0452 08/08/16 0329  08/09/16 0500  Weight: 64 kg (141 lb 1.5 oz) 58.2 kg (128 lb 4.9 oz) 59 kg (130 lb 1.1 oz)    Hypernatremia -Free water per PEG tube 210m TID  Hypokalemia -Potassium goal> 4  Protein calorie malnutrition. -Tube feeds via PEG  Anemia of chronic disease and critical illness.  Chronic adrenal insufficiency. -See CHF     Goals of care -8/26 PALLIATIVE CARE consult placed:Patient has been hospitalized almost consistently since April 2017 with [facility] of [facility]  -Wife Has continued to decline/request for family meeting to discuss CODE STATUS, short-term vs long-term goals of care. On 9/1 instructed palliative care physician to leave everything as is., Even though informed patient's prognosis extremely poor.  -9/5: When patient readmitted would recommend that didn't dependent upon circumstances a conference among healthcare team be convened to determine if further care futile.   Discharge Diagnoses:  Active Problems:   Homeless   History of bacterial meningitis   Tobacco abuse   ETOH abuse   Substance abuse   Cardiogenic shock (HCC)   Cardiac arrest ([facility]   Acute on chronic respiratory failure with hypoxia (HCC)   Tracheostomy status (HCC)   NSTEMI (non-ST elevated myocardial infarction) (HCC)   Chronic combined systolic and diastolic CHF (congestive heart failure) (HCC)   Cerebral thrombosis with cerebral infarction   Protein-calorie malnutrition (HCC)   HLD (hyperlipidemia)   Dysphagia   Seizures (HCC)   Chronic adrenal insufficiency (HCC)   Encephalopathy   Arterial hypotension   New onset a-fib (HCC)   MRSA bacteremia   C. difficile colitis   Aspiration pneumonia ([place]   Syphilis   Pressure ulcer   HCAP (healthcare-associated pneumonia)   Pneumonia of both lower lobes due to methicillin resistant Staphylococcus  aureus (MRSA) ([place])   Pneumonia due to Enterobacter cloacae [facility])   Discharge Condition:  Guarded  Diet recommendation: Vital AF 1.2 cal 73m/hr  Filed Weights   08/07/16 0452 08/08/16 0329 08/09/16 0500  Weight: 64 kg (141 lb 1.5 oz) 58.2 kg (128 lb 4.9 oz) 59 kg (130 lb 1.1 oz)    History of present illness:  50[facility] since April 2017. Depression, Dementia (04/27/2016); Convulsions/seizures ([place] (05/04/2016;Meningoencephalitis, Meningitis, streptococcal (02/16/2016),Cerebral thrombosis with cerebral infarction (03/25/2016); Cerebral septic emboli ([place] NSTEM[place], Cardiac arrest ([place]; Cardiogenic shock ([place]; Chronic Systolic and diastolic CHF, Acute respiratory failure with hypoxia ([place]; ; Trichomonal urethritis in male (02/16/2016); Tobacco abuse (02/16/2016); Substance abuse; ETOH abuse;  Admitted again 8/18 with altered mental status, inability to protect airway. He was hypotensive and hypothermic from UTI. Acute resp failure and severe delirium. S/p Trach on 8/18.  During his hospitalization patient was found to have acute/subacute stroke, acute on chronic, respiratory failure with hypoxia secondary to Enterobacter + MRSA HCAP. In addition patient had recently been treated for strep pneumo meningitis, CVA and seizures. Patient has been evaluated by neurology, PC CM, infectious disease, and palliative care. Patient is receiving maximum antibiotic therapy per infectious disease recommendations, has also received appropriate care per neurology recommendations. Unfortunately patient has failed to respond to aggressive treatment and currently is minimally responsive. Because of this status have attempted on multiple occasions to engage wife in order to arrange meeting to discuss short-term vs long-term goals of care, and CODE STATUS. Palliative care arranged meetings on multiple occasions and wife either canceled or failed to show up to each meeting. Palliative care was able to reach wife via phone on 9/1 again to request that  a family meeting be arranged however per note patient was not interested in further discussion concerning goals of care, CODE STATUS, or meeting with healthcare team. We therefore will continue aggressive antibiotic treatment per infectious disease, [facility] Patient's prognosis is extremely poor as evidenced by his multiple admissions starting in April 2017, with continued downward trend in health status.      Consultants:  Dr. JMichel BickersInfectious disease Dr.Wesam GKathryne SharperPC CM Dr APieter Partridge Neurology Dr.Gene FDomingo CockingPalliative care     Procedures/Significant Events:  CT head 8/8 >>old b/l infarcts with subacute infarct again identified at inferior left caudate. No acute process. 8/08 admitted with unresponsiveness, required intubation. 8/09 low cvp, levophed, possible seizure  8/10 not moving rt ext upper and lowers EEG 8/10 >>suppressed backround MRI brain 8/11 >> multifocal acute Lt MCA ischemic infarcts, possible infarct Rt caudate head MRI brain 8/29:-Moderate-sized confluent left MCA territory infarct, increased in size relative to prior MRI from 07/15/2016. - largely subacute in appearance.    Cultures 8/08 Urine >> positive Enterobacter 8/08 Blood >> NEG 8/09 Sputum >> positive MDR Enterobacter 8/09 Blood >> NEG 8/12 C diff >> antigen and toxin positive 8/23 Blood>>>1/2 Biofire= MRSa vs MRSE>>> 8/23 Urine>>> 8/23 Sputum>>> GNR >> 8/23 blood right arm positive MRSA 8/23 blood right arm negative 8/24 blood left AC negative 8/24 blood right AC positive MRSA 8/27 urine positive yeast 8/30 blood left arm 1/2 positive coag negative staph (most likely contaminant)     Antimicrobials: Vanc 8/8 >>8/10 Zosyn 8/8 >>8/11 meropenem 8/11>> 8/18 Oral vancomycin 8/12 >> 9/11 Unasyn 8/23>>> 8/27 vanc IV 8/24>>>  9/14 Cefepime 8/27>> 9/4   Discharge Exam: Vitals:  08/09/16 0406 08/09/16 0500 08/09/16 0800 08/09/16 0832   BP:   130/80   Pulse: (!) 101   68  Resp: 13   11  Temp:   97.7 F (36.5 C)   TempSrc:   Oral   SpO2:    95%  Weight:  59 kg (130 lb 1.1 oz)    Height:        General: Minimally responsive to painful stimuli, opens eyes slightly. Does not withdraw to painful stimuli. Does not follow commands. Waxing and waning periods of hypothermia requiring heating blanket Cardiovascular:  Regular rhythm and rate, negative murmurs rubs or gallops, normal S1/S2 Respiratory:  Clear to auscultation bilateral. Trach collar in place negative sign of infection    Discharge Instructions     Medication List    STOP taking these medications   bethanechol 10 MG tablet Commonly known as:  URECHOLINE   divalproex 125 MG capsule Commonly known as:  DEPAKOTE SPRINKLE   famotidine 20 MG tablet Commonly known as:  PEPCID Replaced by:  famotidine 40 MG/5ML suspension   LORazepam 0.5 MG tablet Commonly known as:  ATIVAN   potassium chloride SA 20 MEQ tablet Commonly known as:  K-DUR,KLOR-CON   RESOURCE THICKENUP CLEAR Powd   tamsulosin 0.4 MG Caps capsule Commonly known as:  FLOMAX     TAKE these medications   albuterol (2.5 MG/3ML) 0.083% nebulizer solution Commonly known as:  PROVENTIL Take 3 mLs (2.5 mg total) by nebulization every 2 (two) hours as needed for wheezing. What changed:  when to take this   aspirin 325 MG tablet Place 1 tablet (325 mg total) into feeding tube daily. What changed:  how to take this   atorvastatin 20 MG tablet Commonly known as:  LIPITOR Place 1 tablet (20 mg total) into feeding tube daily at 6 PM. What changed:  how to take this   famotidine 40 MG/5ML suspension Commonly known as:  PEPCID Place 2.5 mLs (20 mg total) into feeding tube 2 (two) times daily. Replaces:  famotidine 20 MG tablet   feeding supplement (VITAL AF 1.2 CAL) Liqd Place 1,000 mLs into feeding tube continuous. What changed:  how much to take  how to take this  when to take  this   fludrocortisone 0.1 MG tablet Commonly known as:  FLORINEF Place 1 tablet (0.1 mg total) into feeding tube daily. What changed:  how to take this   folic acid 1 MG tablet Commonly known as:  FOLVITE Place 1 tablet (1 mg total) into feeding tube daily. What changed:  how to take this   free water Soln Place 200 mLs into feeding tube every 8 (eight) hours.   haloperidol lactate 5 MG/ML injection Commonly known as:  HALDOL Inject 0.2-0.4 mLs (1-2 mg total) into the vein every 4 (four) hours as needed.   heparin 5000 UNIT/ML injection Inject 1 mL (5,000 Units total) into the skin every 8 (eight) hours.   hydrocortisone 20 MG tablet Commonly known as:  CORTEF Place 5 tablets (100 mg total) into feeding tube 3 (three) times daily. What changed:  how to take this  when to take this   levETIRAcetam 100 MG/ML solution Commonly known as:  KEPPRA Place 15 mLs (1,500 mg total) into feeding tube 2 (two) times daily. What changed:  how to take this   QUEtiapine 50 MG tablet Commonly known as:  SEROQUEL Place 1 tablet (50 mg total) into feeding tube 2 (two) times daily.   thiamine 100 MG  tablet Place 1 tablet (100 mg total) into feeding tube daily. What changed:  how to take this   Valproate Sodium 250 MG/5ML Soln solution Commonly known as:  DEPAKENE Place 15 mLs (750 mg total) into feeding tube every 8 (eight) hours.   vancomycin 50 mg/mL oral solution Commonly known as:  VANCOCIN Place 10 mLs (500 mg total) into feeding tube every 6 (six) hours.   Vancomycin 750-5 MG/150ML-% Soln Commonly known as:  VANCOCIN Inject 150 mLs (750 mg total) into the vein every 12 (twelve) hours.      No Known Allergies    The results of significant diagnostics from this hospitalization (including imaging, microbiology, ancillary and laboratory) are listed below for reference.    Significant Diagnostic Studies: Ct Head Wo Contrast  Result Date: 07/12/2016 CLINICAL DATA:   Unresponsive, mental status change, history dementia, meningitis, CHF, stroke EXAM: CT HEAD WITHOUT CONTRAST TECHNIQUE: Contiguous axial images were obtained from the base of the skull through the vertex without intravenous contrast. COMPARISON:  07/06/2016 FINDINGS: Generalized atrophy. Normal ventricular morphology. No midline shift or mass effect. Old BILATERAL basal ganglia lacunar infarcts. Subacute inferior LEFT caudate infarct as seen on prior study. Minimal small vessel chronic ischemic changes of deep cerebral white matter. No intracranial hemorrhage, mass lesion or evidence of cortical infarction. No extra-axial fluid collections. Air-fluid level LEFT maxillary sinus. Remaining paranasal sinuses and mastoid air cells clear. Bones unremarkable. IMPRESSION: Atrophy with minimal small vessel chronic ischemic changes of deep cerebral white matter. Old BILATERAL basal ganglia lacunar infarcts with a subacute infarct again identified at the inferior LEFT caudate. No new intracranial abnormalities. Electronically Signed   By: Lavonia Dana M.D.   On: 07/12/2016 17:16   Mr Brain Wo Contrast  Result Date: 08/02/2016 CLINICAL DATA:  Initial evaluation for decreased responsiveness, status post recent stroke. Question new or worsened stroke. EXAM: MRI HEAD WITHOUT CONTRAST TECHNIQUE: Multiplanar, multiecho pulse sequences of the brain and surrounding structures were obtained without intravenous contrast. COMPARISON:  Previous brain MRI from 07/15/2016. FINDINGS: Limited DWI sequences were performed. Additionally, study is somewhat degraded by motion artifact. Axial DWI sequence demonstrates a moderate-size confluent diffusion abnormality involving the left MCA territory, increased in size relative to most recent MRI from 07/15/2016. Area of infarction involves the left frontal lobe, including the operculum and insula, with extension towards the the left temporal lobe. This is largely subacute in appearance with  normalization of associated ADC signal. The No significant mass effect. Evaluation for associated hemorrhage limited on this exam. No other acute intracranial infarct. No other definite acute intracranial process on this limited study. Remote watershed type infarcts involving the right cerebral hemisphere again noted. Remote lacunar infarcts involving the left basal ganglia noted. Chronic microvascular ischemic disease grossly stable. No hydrocephalus or midline shift. No extra-axial fluid collection. IMPRESSION: 1. Moderate-sized confluent left MCA territory infarct, increased in size relative to prior MRI from 07/15/2016. This is largely subacute in appearance. Mild localized edema without significant mass effect. The 2. No other definite acute intracranial process on this limited study. Electronically Signed   By: Jeannine Boga M.D.   On: 08/02/2016 16:50   Mr Brain Wo Contrast  Result Date: 07/15/2016 CLINICAL DATA:  Initial evaluation for acute stroke. History of recent strep pneumo meningitis and subacute CVA. Fall he or the way he all walking and EXAM: MRI HEAD WITHOUT CONTRAST TECHNIQUE: Multiplanar, multiecho pulse sequences of the brain and surrounding structures were obtained without intravenous contrast. COMPARISON:  Prior CT  from 07/12/2016 as well as multiple previous MRIs. FINDINGS: Diffuse prominence of the CSF containing spaces is compatible with generalized cerebral atrophy. Patchy and confluent T2/FLAIR hyperintensity within the periventricular and deep white matter both cerebral hemispheres most consistent with chronic small vessel ischemic disease. Remote lacunar infarcts present within the left basal ganglia/corona radiata. Additional remote infarct involving the left thalamus extending towards the left cerebral peduncle. Remote lacunar infarct present within the right basal ganglia as well. There is new/worsened diffusion abnormality involving the left basal ganglia, primarily  involving the left caudate head and anterior limb of the left internal capsule (series 4, image 25). Differential considerations include recurrent ischemia within this region, although this would be subacute in nature given the associated T2/FLAIR signal abnormality and relatively normal ADC signal. Possible toxic metabolic derangement could also conceivably be considered. Overall, the signal changes are similar relative to previous MRI from 03/24/2016. Few scattered patchy multi focal foci of restricted diffusion are present within the left MCA territory, primarily involving the left frontal operculum and insular region, with extension towards the left basal ganglia. These are predominantly subcentimeter in nature. No associated hemorrhage or mass effect. Punctate focus of restricted diffusion within the right caudate had also suspicious for possible small infarct (series 4, image 27). No other evidence for acute ischemia. Gray-white matter differentiation otherwise largely maintained. Major intracranial vascular flow voids are preserved. No acute intracranial hemorrhage. Small amount of chronic blood products at the left basal ganglia. Similar FLAIR signal abnormality at the right frontal and parietal cortex. Again noted is subtle laminar necrosis within the right parieto-occipital region. The No mass lesion, midline shift, or mass effect. No hydrocephalus. Layering debris again noted within the occipital horns bilaterally, similar to prior, and likely related to history of prior meningitis. No extra-axial fluid collection. Major dural sinuses are grossly patent. Craniocervical junction normal. Visualized upper cervical spine within normal limits. Pituitary gland normal. No acute abnormality about the globes and orbits. Mucosal thickening with fluid level present within the left maxillary sinus. Scattered opacity mucosal thickening within the remainder of the paranasal sinuses. Fluid within the posterior  nasopharynx. Bilateral mastoid effusions. Patient likely intubated. Bone marrow signal intensity within normal limits. No scalp soft tissue abnormality. IMPRESSION: 1. Patchy small volume multi focal acute ischemic left MCA territory infarcts, primarily involving the left frontal operculum and insular region. Possible additional tiny punctate infarct within the right caudate head. 2. Recurrent diffusion and T2/FLAIR signal abnormality within the left basal ganglia. This is worsened relative to most recent MRI from 06/21/2016, but similar relative to prior MRI from 03/24/2016. Differential considerations include recurrent subacute ischemia within this region. Possible toxic metabolic derangement could also be considered. 3. Otherwise relatively stable appearance of the brain as compared to recent MRI from 06/21/2016. Electronically Signed   By: Jeannine Boga M.D.   On: 07/15/2016 04:04   Ir Gastrostomy Tube Mod Sed  Result Date: 08/01/2016 CLINICAL DATA:  Respiratory failure and aspiration pneumonia. The patient requires percutaneous gastrostomy for long-term nutritional needs. EXAM: PERCUTANEOUS GASTROSTOMY TUBE PLACEMENT ANESTHESIA/SEDATION: 1.0 mg IV Versed; 50 mcg IV Fentanyl. Total Moderate Sedation Time 6 minutes. The patient's level of consciousness and physiologic status were continuously monitored during the procedure by Radiology nursing. CONTRAST:  20 mL Isovue-300 injected into the stomach. MEDICATIONS: A scheduled dose of IV cefepime was administered approximately 1 hour prior to the procedure. FLUOROSCOPY TIME:  1 minute and 42 seconds. PROCEDURE: The procedure, risks, benefits, and alternatives were explained to the  patient's wife. Questions regarding the procedure were encouraged and answered. The patient's wife understands and consents to the procedure. A time-out was performed prior to the procedure. A 5-French catheter was then advanced through the the patient's mouth under fluoroscopy  into the esophagus and to the level of the stomach. This catheter was used to insufflate the stomach with air under fluoroscopy. The abdominal wall was prepped with Betadine in a sterile fashion, and a sterile drape was applied covering the operative field. A sterile gown and sterile gloves were used for the procedure. Local anesthesia was provided with 1% Lidocaine. A skin incision was made in the upper abdominal wall. Under fluoroscopy, an 18 gauge trocar needle was advanced into the stomach. Contrast injection was performed to confirm intraluminal position of the needle tip. A single T tack was then deployed in the lumen of the stomach. This was brought up to tension at the skin surface. Over a guidewire, a 9-French sheath was advanced into the lumen of the stomach. The wire was left in place as a safety wire. A loop snare device from a percutaneous gastrostomy kit was then advanced into the stomach. A floppy guide wire was advanced through the orogastric catheter under fluoroscopy in the stomach. The loop snare advanced through the percutaneous gastric access was used to snare the guide wire. This allowed withdrawal of the loop snare out of the patient's mouth by retraction of the orogastric catheter and wire. A 20-French bumper retention gastrostomy tube was looped around the snare device. It was then pulled back through the patient's mouth. The retention bumper was brought up to the anterior gastric wall. The T tack suture was cut at the skin. The exiting gastrostomy tube was cut to appropriate length and a feeding adapter applied. The catheter was injected with contrast material to confirm position and a fluoroscopic spot image saved. The tube was then flushed with saline. A dressing was applied over the gastrostomy exit site. COMPLICATIONS: None. FINDINGS: The stomach distended well with air allowing safe placement of the gastrostomy tube. After placement, the tip of the gastrostomy tube lies in the body of  the stomach. IMPRESSION: Percutaneous gastrostomy with placement of a 20-French bumper retention tube in the body of the stomach. This tube can be used for percutaneous feeds beginning in 24 hours after placement. Electronically Signed   By: Aletta Edouard M.D.   On: 08/01/2016 16:38   Dg Chest Port 1 View  Result Date: 07/28/2016 CLINICAL DATA:  Dyspnea. EXAM: PORTABLE CHEST 1 VIEW COMPARISON:  07/27/2016 FINDINGS: Right IJ approach central venous catheter, feeding catheter and tracheostomy tube in stable position. Cardiomediastinal silhouette is normal. Mediastinal contours appear intact. There is no evidence of pneumothorax. There is persistent right middle lobe and left lower lobe airspace consolidation with volume loss in the left lung base. Small bilateral pleural effusions cannot be excluded. Osseous structures are without acute abnormality. Soft tissues are grossly normal. IMPRESSION: Persistent bilateral patchy airspace consolidation with evidence of volume loss and the left lower lobe. Small bilateral pleural effusions cannot be excluded. Stable support apparatus. Electronically Signed   By: Fidela Salisbury M.D.   On: 07/28/2016 07:15   Dg Chest Port 1 View  Result Date: 07/27/2016 CLINICAL DATA:  Check tracheostomy tube EXAM: PORTABLE CHEST 1 VIEW COMPARISON:  07/22/2016 FINDINGS: Cardiac shadow is within normal limits. A tracheostomy tube, right jugular central line and feeding catheter are seen in satisfactory position. Patchy bilateral infiltrates are noted in the bases increased from  the prior exam. Left pleural effusion is noted as well. IMPRESSION: Patchy basilar infiltrates and left pleural effusion increased from the prior exam. Electronically Signed   By: Inez Catalina M.D.   On: 07/27/2016 11:31   Dg Chest Port 1 View  Result Date: 07/22/2016 CLINICAL DATA:  Tracheostomy placement EXAM: PORTABLE CHEST 1 VIEW COMPARISON:  July 21, 2016 FINDINGS: A tracheostomy is now present  with the tip 5.2 cm above the carina. Central catheter tip is in the superior vena cava. No pneumothorax. There is left lower lobe consolidation with left effusion. There is a smaller right effusion. Right lung is otherwise clear. Heart size and pulmonary vascularity are normal. No adenopathy. IMPRESSION: Tracheostomy tip 5.2 cm above the carina. Central catheter tip in superior vena cava. No pneumothorax. Persistent left lower lobe consolidation with left effusion. Small right effusion present. Stable cardiac silhouette. Electronically Signed   By: Lowella Grip III M.D.   On: 07/22/2016 12:38   Dg Chest Port 1 View  Result Date: 07/21/2016 CLINICAL DATA:  50 y/o  M; respiratory failure. EXAM: PORTABLE CHEST 1 VIEW COMPARISON:  Chest radiograph dated 07/18/2016. FINDINGS: Enteric tube tip projects over the mid abdomen. Endotracheal tube tip is 4.5 cm from the carina. Right central venous catheter tip projects over the lower SVC. Patient is rotated. Bibasilar opacities may represent atelectasis, effusion, and/or pneumonia and are stable. IMPRESSION: Stable bibasilar opacities which may represent atelectasis, effusions, and/or pneumonia. Stable lines and tubes. Electronically Signed   By: Kristine Garbe M.D.   On: 07/21/2016 05:15   Dg Chest Port 1 View  Result Date: 07/18/2016 CLINICAL DATA:  Intubation. EXAM: PORTABLE CHEST 1 VIEW COMPARISON:  07/17/2016 . FINDINGS: Endotracheal tube, NG tube, right IJ line in stable position. Bibasilar atelectasis and/or infiltrates again noted. Small bilateral pleural effusions. Chest is stable from prior exam. No pneumothorax . IMPRESSION: 1. Lines and tubes in stable position. 2. Stable bibasilar atelectasis and/or infiltrates. Stable small bilateral pleural effusions. Chest is unchanged from prior study of 07/17/2016. Electronically Signed   By: Marcello Moores  Register   On: 07/18/2016 06:49   Dg Chest Port 1 View  Result Date: 07/17/2016 CLINICAL DATA:   Intubated patient. Respiratory failure. Followup exam. EXAM: PORTABLE CHEST 1 VIEW COMPARISON:  07/14/2016 FINDINGS: Opacity at the left lung base, obscuring the left hemidiaphragm, appears mildly improved. This is consistent with a combination of left pleural fluid with either pneumonia, atelectasis or a combination. There is new hazy right lung base opacity consistent with a small effusion with atelectasis. Remainder of the lungs is clear. No pulmonary edema. Nasal/ orogastric tube appears further inserted now curling within stomach. Endotracheal tube and right internal jugular central venous line are stable and well positioned. IMPRESSION: 1. Lung aeration at the left base appears mildly improved. There is new mild right lung base hazy opacity consistent with a small effusion and atelectasis. 2. No pulmonary edema or pneumothorax. 3. Support apparatus is well positioned. Electronically Signed   By: Lajean Manes M.D.   On: 07/17/2016 07:18   Dg Chest Port 1 View  Result Date: 07/14/2016 CLINICAL DATA:  Respiratory failure. EXAM: PORTABLE CHEST 1 VIEW COMPARISON:  07/13/2016. FINDINGS: Interim placement right IJ line. Tip at the cavoatrial junction. Endotracheal tube in stable position. NG tube tip noted in the stomach. The NG tube side hole is at the gastroesophageal junction. Advancement of the NG tube of approximately 8 cm should be considered. Interim progression of left lower lobe infiltrate and atelectasis.  Small left pleural effusion cannot be excluded. No pneumothorax. IMPRESSION: 1. Interim placement of right IJ line, its tip is at the cavoatrial junction. Endotracheal tube in stable position. NG tube tip noted projected over the upper stomach, NG tube side hole at the gastroesophageal junction. Advancement of approximately 8 cm should be considered. 2. Progressive left lower lobe infiltrate atelectasis. Small left pleural effusion cannot be excluded. These results will be called to the ordering  clinician or representative by the Radiologist Assistant, and communication documented in the PACS or zVision Dashboard. Electronically Signed   By: Marcello Moores  Register   On: 07/14/2016 07:09   Dg Chest Port 1 View  Result Date: 07/13/2016 CLINICAL DATA:  Respiratory failure. EXAM: PORTABLE CHEST 1 VIEW COMPARISON:  07/12/2016. FINDINGS: Endotracheal tube in stable position. NG tube tip noted in the upper stomach. Side hole gastroesophageal junction. Advancement of the NG tube by approximately 8 cm suggested. Heart size stable. Progressive left lower lobe atelectasis and/or infiltrate. Small left pleural effusion. No pneumothorax. IMPRESSION: 1. Endotracheal tube in stable position. NG tube tip noted in the upper stomach. Side hole at the gastroesophageal junction. Advancement of the NG tube by approximately 8 cm suggested. 2. Progressive left lower lobe atelectasis and or infiltrate. Small left pleural effusion. Electronically Signed   By: Marcello Moores  Register   On: 07/13/2016 06:42   Dg Chest Portable 1 View  Result Date: 07/12/2016 CLINICAL DATA:  Status post intubation. EXAM: PORTABLE CHEST 1 VIEW COMPARISON:  07/12/2016 at 1420 hours FINDINGS: Endotracheal tube tip projects 19 mm above the carina, well positioned. New nasal/ orogastric tube extends well below the diaphragm into the stomach and below the included field of view. Right internal jugular central venous line tip now projects in the lower superior vena cava above the right atrium and caval atrial junction. Lungs are clear.  No pleural effusion or pneumothorax. IMPRESSION: 1. Well-positioned endotracheal tube, nasal/orogastric tube and right internal jugular central venous line. 2. No pneumothorax.  No acute findings in the lungs. Electronically Signed   By: Lajean Manes M.D.   On: 07/12/2016 18:32   Dg Chest Portable 1 View  Result Date: 07/12/2016 CLINICAL DATA:  Central line placement.  Unresponsive. EXAM: PORTABLE CHEST 1 VIEW COMPARISON:   07/05/2016 FINDINGS: Indistinct upper zone vasculature probably at least partially due to supine positioning. Heart size within normal limits. Right IJ line tip just over 3 vertebral body lengths below the carina, compatible with the right atrium. If cavoatrial junction positioning is desired, consider retraction by 3.5 cm. Low lung volumes are present, causing crowding of the pulmonary vasculature. No airspace opacity identified. IMPRESSION: 1. Right IJ line tip:  Right atrium.  No pneumothorax. 2. Low lung volumes are present, causing crowding of the pulmonary vasculature. Electronically Signed   By: Van Clines M.D.   On: 07/12/2016 14:37   Dg Abd Portable 1v  Result Date: 07/26/2016 CLINICAL DATA:  Encounter for feeding tube placement. EXAM: PORTABLE ABDOMEN - 1 VIEW COMPARISON:  Earlier today FINDINGS: Advanced feeding tube with tip just before the ligament of Treitz. Nonobstructive bowel gas pattern. Visualized lung bases are clear. IMPRESSION: Advanced feeding tube with tip in the distal duodenum. Electronically Signed   By: Monte Fantasia M.D.   On: 07/26/2016 19:46   Dg Abd Portable 1v  Result Date: 07/26/2016 CLINICAL DATA:  Feeding tube placement. EXAM: PORTABLE ABDOMEN - 1 VIEW COMPARISON:  07/22/2016. FINDINGS: Soft feeding tube has its tip at the gastric antrum. Gas pattern is  unremarkable. IMPRESSION: Feeding tube tip at the gastric antrum. Electronically Signed   By: Nelson Chimes M.D.   On: 07/26/2016 10:23   Dg Abd Portable 1v  Result Date: 07/22/2016 CLINICAL DATA:  Feeding tube placement EXAM: PORTABLE ABDOMEN - 1 VIEW COMPARISON:  None. FINDINGS: Weighted feeding tube terminates in the distal 4th portion of the duodenum near the ligament of Treitz. Operative bowel gas pattern. Visualized osseous structures are within normal limits. IMPRESSION: Weighted feeding tube terminates near the ligament of Treitz. Electronically Signed   By: Julian Hy M.D.   On: 07/22/2016 17:05     Microbiology: Recent Results (from the past 240 hour(s))  Culture, Urine     Status: Abnormal   Collection Time: 07/31/16  7:00 PM  Result Value Ref Range Status   Specimen Description URINE, CLEAN CATCH  Final   Special Requests NONE  Final   Culture >=100,000 COLONIES/mL YEAST (A)  Final   Report Status 08/03/2016 FINAL  Final  Culture, blood (routine x 2)     Status: Abnormal   Collection Time: 08/03/16 12:38 PM  Result Value Ref Range Status   Specimen Description BLOOD LEFT ARM  Final   Special Requests IN PEDIATRIC BOTTLE 2.5CC  Final   Culture  Setup Time   Final    GRAM POSITIVE COCCI IN CLUSTERS IN PEDIATRIC BOTTLE CRITICAL RESULT CALLED TO, READ BACK BY AND VERIFIED WITH: L BAJBUS,PHARMD AT 0846 08/06/16 BY L BENFIELD    Culture (A)  Final    STAPHYLOCOCCUS SPECIES (COAGULASE NEGATIVE) THE SIGNIFICANCE OF ISOLATING THIS ORGANISM FROM A SINGLE SET OF BLOOD CULTURES WHEN MULTIPLE SETS ARE DRAWN IS UNCERTAIN. PLEASE NOTIFY THE MICROBIOLOGY DEPARTMENT WITHIN ONE WEEK IF SPECIATION AND SENSITIVITIES ARE REQUIRED.    Report Status 08/07/2016 FINAL  Final  Culture, blood (routine x 2)     Status: None   Collection Time: 08/03/16  4:00 PM  Result Value Ref Range Status   Specimen Description BLOOD LEFT ARM  Final   Special Requests IN PEDIATRIC BOTTLE 2.5CC  Final   Culture NO GROWTH 5 DAYS  Final   Report Status 08/08/2016 FINAL  Final     Labs: Basic Metabolic Panel:  Recent Labs Lab 08/04/16 0451 08/06/16 0338 08/08/16 0328 08/09/16 0244  NA 144 145 144 146*  K 3.0* 3.8 3.4* 3.9  CL 100* 101 98* 104  CO2 36* 34* 36* 34*  GLUCOSE 97 96 90 108*  BUN _0 CREATININE 0.63 0.61 0.55* 0.65  CALCIUM 8.7* 9.1 9.5 9.1   Liver Function Tests:  Recent Labs Lab 08/04/16 0451 08/08/16 0328 08/09/16 0244  AST 14* 16 15  ALT 12* 13* 12*  ALKPHOS 51 65 59  BILITOT 0.3 0.2* 0.5  PROT 5.5* 6.4* 5.6*  ALBUMIN 2.1* 2.5* 2.2*   No results for input(s):  LIPASE, AMYLASE in the last 168 hours. No results for input(s): AMMONIA in the last 168 hours. CBC:  Recent Labs Lab 08/04/16 0451 08/08/16 0328 08/09/16 0244  WBC 9.7 11.6* 12.3*  HGB 10.1* 10.9* 9.7*  HCT 31.2* 34.7* 30.9*  MCV 89.1 91.3 90.4  PLT 308 302 265   Cardiac Enzymes: No results for input(s): CKTOTAL, CKMB, CKMBINDEX, TROPONINI in the last 168 hours. BNP: BNP (last 3 results)  Recent Labs  03/20/16 1800  BNP 23.8    ProBNP (last 3 results) No results for input(s): PROBNP in the last 8760 hours.  CBG:  Recent Labs Lab 08/08/16 2002 08/08/16  2259 08/09/16 0329 08/09/16 0805 08/09/16 1221  GLUCAP 74 85 98 109* 121*       Signed:  Dia Crawford, MD [facility] [phone] pager

## 2016-08-09 NOTE — Progress Notes (Signed)
Spoke with Marcelyn BruinsKayla Workman, [facility] ([facility]  [facility] for Vancomycin therapy. I also spoke with Esaw DaceBobbi [name], RN, AD and according to their policy theey give Vancomycin per PICC line.  MD made aware.

## 2016-08-09 NOTE — Progress Notes (Signed)
Pt having a sterile procedure.

## 2016-08-09 NOTE — Progress Notes (Signed)
Patient will DC to: [facility]  Anticipated DC date: 08/09/2016 Family notified: Wife Transport by: PTAR  RN report: [phone]  CS[facility] has [facility].     Noe GensAshley Gardner, MSW, LCSW [facility] Clinical Social Worker [phone]

## 2016-08-09 NOTE — Progress Notes (Signed)
Peripherally Inserted Central Catheter/Midline Placement  The IV Nurse has discussed with the patient and/or persons authorized to consent for the patient, the purpose of this procedure and the potential benefits and risks involved with this procedure.  The benefits include less needle sticks, lab draws from the catheter and patient may be discharged home with the catheter.  Risks include, but not limited to, infection, bleeding, blood clot (thrombus formation), and puncture of an artery; nerve damage and irregular heat beat.  Alternatives to this procedure were also discussed.  PICC/Midline Placement Documentation        Dale [name], Dale [name] 08/09/2016, 4:58 PM Phone consent obtained from wife Dale [name]

## 2016-08-09 NOTE — Progress Notes (Signed)
PULMONARY / CRITICAL CARE MEDICINE   Name: Dale [name] MRN: [id number] DOB: 10/05/1966    ADMISSION DATE:  07/12/2016 CONSULTATION DATE:  07/12/16  REFERRING MD:  [facility]  CHIEF COMPLAINT:  AMS  50 yo male from [facility] with frequent admissions admitted again 8/18 with altered mental status, inability to protect airway.  He was hypotensive and hypothermic from UTI. Acute resp failure and severe delirium. S/p Trach on 8/18.   He has PMHx of combined CHF, Streptococcal meningitis, ETOH, Depression, Seizures, Dementia, CVA.  SUBJECTIVE:  Reportedly off vent since AM 8/28. Tolerating TC.  Seroquel titrated up to 75 mg BID 8/31, continues to get agitated, not following commands . Tolerating TF via PEG.   VITAL SIGNS: BP 130/80 (BP Location: Left Arm)   Pulse (!) 101   Temp 97.7 F (36.5 C) (Oral)   Resp 13   Ht 5\' 8"  (1.727 m)   Wt 130 lb 1.1 oz (59 kg)   SpO2 97%   BMI 19.78 kg/m   HEMODYNAMICS:    VENTILATOR SETTINGS: FiO2 (%):  [28 %] 28 %  INTAKE / OUTPUT: I/O last 3 completed shifts: In: 4561.3 [I.V.:1601.3; Other:100; NG/GT:2210; IV Piggyback:650] Out: 3000 [Urine:3000]  PHYSICAL EXAMINATION: General: ill appearing, trached on ATC, does not follow commands. Neuro: RASS 0 to -1, somnolent,no follow commands, agitated with stimulation HEENT: mm moist, no JVD, trach in place, clean Cardiac: s1 s2 RRR Chest: reduced Abd: soft, non tender. Ext: 1+ edema UE, 2+ LLE. Skin: sacral pressure ulcer.  LABS:  BMET  Recent Labs Lab 08/06/16 0338 08/08/16 0328 08/09/16 0244  NA 145 144 146*  K 3.8 3.4* 3.9  CL 101 98* 104  CO2 34* 36* 34*  BUN 15 17 18   CREATININE 0.61 0.55* 0.65  GLUCOSE 96 90 108*   Electrolytes  Recent Labs Lab 08/06/16 0338 08/08/16 0328 08/09/16 0244  CALCIUM 9.1 9.5 9.1    CBC  Recent Labs Lab 08/04/16 0451 08/08/16 0328 08/09/16 0244  WBC 9.7 11.6* 12.3*  HGB 10.1* 10.9* 9.7*  HCT 31.2* 34.7* 30.9*  PLT 308 302 265   Coag's No  results for input(s): APTT, INR in the last 168 hours. Sepsis Markers No results for input(s): LATICACIDVEN, PROCALCITON, O2SATVEN in the last 168 hours.  ABG  Recent Labs Lab 08/02/16 1200  PHART 7.488*  PCO2ART 41.4  PO2ART 109*    Liver Enzymes  Recent Labs Lab 08/04/16 0451 08/08/16 0328 08/09/16 0244  AST 14* 16 15  ALT 12* 13* 12*  ALKPHOS 51 65 59  BILITOT 0.3 0.2* 0.5  ALBUMIN 2.1* 2.5* 2.2*    Cardiac Enzymes No results for input(s): TROPONINI, PROBNP in the last 168 hours.  Glucose  Recent Labs Lab 08/08/16 1157 08/08/16 1644 08/08/16 1922 08/08/16 2002 08/08/16 2259 08/09/16 0329  GLUCAP 97 79 69 74 85 98   Imaging No results found. STUDIES:  CT head 8/8 >> old b/l infarcts with subacute infarct again identified at inferior left caudate.  No acute process. EEG 8/10 >>suppressed backround MRI brain 8/11 >> multifocal acute Lt MCA ischemic infarcts, possible infarct Rt caudate head  CULTURES: 8/08 Urine >> Enterobacter 8/08 Blood >> NEG 8/09 Sputum >> MDR Enterobacter 8/09 Blood >> NEG 8/12 C diff >> antigen and toxin positive 8/23 Blood>>> 1/2 Biofire= MRSa vs MRSE>>> 8/23 Urine>>> Greater than 100,000/ ml colonies of yeast. 8/23 Sputum>>> GNR >>  ANTIBIOTICS: Vanc 8/8 >>8/10 Zosyn 8/8 >>8/11 meropenem 8/11>> 8/18 Oral vancomycin 8/12 >> Unasyn 8/23>>>  vanc IV 8/24>>>  SIGNIFICANT EVENTS: 8/08 admitted with unresponsiveness, required intubation. 8/09 low cvp, levophed, possible seizure  8/10 not moving rt ext upper and lowers 8/13 Following commands 8/15>> Following Commands, but agitated., Norepi off 8/15 am. 8/23 asp concerns 9/5>> Tolerating TC, hypotension and hypothermia resolving  LINES/TUBES: ETT 8/8 > 8/18 Trach (JY) 8/18 >>  R IJ CVL 8/8 >>>8/25   ASSESSMENT / PLAN:  PULMONARY A: Acute hypoxic respiratory failure - due to inability to protect airway. 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 Aspiration  PNA 8/23 event Continued copious secretions P:    ABG's prn  somnolence. NO PMV with copious secretions. pcxr intermittent.( 08/10/2016)  CARDIOVASCULAR A:  Septic shock from UTI, HCAP, C diff colitis - resolved.  Chronic combined CHF, HLD. Brady Soft BP 9/4 better 9/5 with IVF Positive fluid balance 9/4( 1100) P:  MAP goal >55. Continue ASA, lipitor. Continue florinef, hydrocort. Monitor QTc with Haldol use.  RENAL A:   No acute issues. P:   Monitor UO BMET per TRH  GASTROINTESTINAL A:   Protein calorie malnutrition. S/p PEG 8/28. Tolerating TF well at goal P:   Continue TF's. Pepcid for SUP.  HEMATOLOGIC A:   Anemia of chronic disease and critical illness. No obvious signs of bleeding Leukocytosis P:  F/u CBC 9/6 SQ heparin for DVT prophylaxis.  INFECTIOUS A:   MDR gram neg UTI - VRE faecium vs colonization. HCAP with Enterobacter >> MDR. C diff colitis. New aspiration PNA 8/23. GPC clusters in blood- contam 8/23 likely 1/2. Hypothermia/ soft BP 9/4, improving 9/5 P:   Continue cefepime, vanc. Monitor fever curve Monitor WBC  ENDOCRINE A:   Chronic adrenal insufficiency. P:    Continue florinef, hydrocort. Continue SSI. Continue CBG's Q4  NEUROLOGIC A:   Acute encephalopathy. Hx seizures, strep pneumo meningitis, CVA. Agitated requiring seroquel  > titrated up to 75 mg BID P:   Assess ABG as needed for somnolence. Continue depakote, keppra  Continue Seroquel to 75 mg BID 8/20.  No Family at bedside 08/09/16.  Pt. Was scheduled to return to [facility] 9/4 but transfer was held due to hypotension and hypothermia, which is resolving today 9/5.  Bevelyn Ngo, AGACNP-BC [facility] Pager: [phone] 08/09/2016   STAFF NOTE: Cindi Carbon, MD FACP have personally reviewed patient's available data, including medical history, events of note, physical examination and test results as part of my  evaluation. I have discussed with resident/NP and other care providers such as pharmacist, RN and RRT. In addition, I personally evaluated patient and elicited key findings of:  Awake, coarse BS but clear anterior, no new pcxr, no new abg, remains successful on trach collar trials last few days, no fevers, UA neg WBC, would NOT treat yeast, avoid foley as able, continued trach collar trials, would not downsize, no PMV, secretions are an issue at times, cuff remain as able, going back to vent would be Futile and medically ineffective therapy and allow him to suffer, need to have family meeting about there wishes and approach this as futility  Mcarthur Rossetti. Tyson Alias, MD, FACP Pgr: [phone] [facility] 08/09/2016 3:56 PM

## 2016-08-10 ENCOUNTER — Inpatient Hospital Stay (HOSPITAL_COMMUNITY): Payer: Medicaid Other

## 2016-08-10 LAB — CBC
HEMATOCRIT: 29 % — AB (ref 39.0–52.0)
Hemoglobin: 9.3 g/dL — ABNORMAL LOW (ref 13.0–17.0)
MCH: 28.7 pg (ref 26.0–34.0)
MCHC: 32.1 g/dL (ref 30.0–36.0)
MCV: 89.5 fL (ref 78.0–100.0)
PLATELETS: 258 10*3/uL (ref 150–400)
RBC: 3.24 MIL/uL — ABNORMAL LOW (ref 4.22–5.81)
RDW: 18 % — AB (ref 11.5–15.5)
WBC: 13.3 10*3/uL — AB (ref 4.0–10.5)

## 2016-08-10 LAB — GLUCOSE, CAPILLARY
GLUCOSE-CAPILLARY: 107 mg/dL — AB (ref 65–99)
GLUCOSE-CAPILLARY: 94 mg/dL (ref 65–99)
Glucose-Capillary: 121 mg/dL — ABNORMAL HIGH (ref 65–99)
Glucose-Capillary: 97 mg/dL (ref 65–99)

## 2016-08-10 MED ORDER — HEPARIN SOD (PORK) LOCK FLUSH 100 UNIT/ML IV SOLN
250.0000 [IU] | INTRAVENOUS | Status: AC | PRN
  Administered 2016-08-10: 250 [IU]

## 2016-08-10 NOTE — Progress Notes (Signed)
[facility] Anticipated discharge date: 9/6 Family notified: [name] (wife) Transportation by Sharin MonsPTAR- called at 1:35pm  CSW signing off.  Burna SisJenna H. Kieren Adkison, LCSWA Clinical Social Worker [phone]

## 2016-08-10 NOTE — Discharge Summary (Addendum)
Physician Discharge Summary  Dale [name] UJW:[id number] DOB: [birth year] DOA: 07/12/2016  PCP: No primary care provider on file.  Admit date: 07/12/2016 Discharge date: 08/10/2016  Time spent: >30 minutes  Hospital Course by Active Problem:  Acute/subacute stroke/ Acute Encephalopathy. -8/29 MRI  -Encephalopathy Multifactorial to include CVA/subacute CVA , sedating medication fentanyl and Ativan,Seroquel. After sedating medication discontinued/decreased pt became agitated: no meaningful interaction. Titrated medication to attempt to control agitation  Agitation -Haldol FAO:ZHYQ for HR<50 or if QT becomes prolonged  Acute on chronic hypoxic respiratory failure/ Enterobacter &MRSA HCAP -due to inability to protect airway. 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 -no evidence of volume overload at time of d/c  -Daily weight. Filed Weights   08/09/16 0500 08/10/16 0408 08/10/16 0450  Weight: 59 kg (130 lb 1.1 oz) 60.6 kg (133 lb 9.6 oz) 64.7 kg (142 lb 10.2 oz)    Hypernatremia -Free water per PEG tube  TID - stable at time of d/c   Recent Labs Lab 08/04/16 0451 08/06/16 0338 08/08/16 0328 08/09/16 0244  NA 144 145 144 146*    Hypokalemia -Potassium goal> 4  Recent Labs Lab 08/04/16 0451 08/06/16 0338 08/08/16 0328 08/09/16 0244  K 3.0* 3.8 3.4* 3.9    Severe malnutrition in context of chronic illness -Tube feeds via PEG  Anemia of chronic disease and critical illness.  Chronic adrenal insufficiency. --Continue florinef -Hydrocortisone 100 mg TID    Goals of care -8/26 PALLIATIVE CARE consult placed:Patient has been hospitalized almost consistently since April 2017 with short periods of time in between hospitalizations at [facility].  -Wife Has continued to decline/request for family meeting to discuss CODE STATUS, short-term vs long-term goals of care. On 9/1 instructed palliative care physician to leave everything as is., Even though informed patient's prognosis extremely poor.  -9/5: When patient readmitted would recommend that didn't dependent upon circumstances a conference among healthcare team be convened to determine if further care futile.   Discharge Diagnoses:  Active Problems:   History of bacterial meningitis   Acute on chronic respiratory failure with hypoxia (HCC)   Tracheostomy status (HCC)   Chronic combined systolic and diastolic CHF (congestive heart failure) (HCC)   Cerebral thrombosis with cerebral infarction   Protein-calorie malnutrition (HCC)   Dysphagia   Seizures (HCC)   Adrenal insufficiency (Addison's disease) (HCC)   Encephalopathy   Arterial hypotension   MRSA bacteremia   C. difficile colitis   Discharge Condition: Guarded  Diet recommendation: Vital AF 1.2 cal 104ml/hr  Filed Weights   08/09/16 0500 08/10/16 0408 08/10/16 0450  Weight: 59  kg (130 lb 1.1 oz) 60.6 kg (133 lb 9.6 oz) 64.7 kg (142 lb 10.2 oz)    History of present illness:  50 year old BM presented from [facility] PMHx of multiple admissions  since April 2017. Depression, Dementia (04/27/2016); Convulsions/seizures (HCC) (05/04/2016;Meningoencephalitis, Meningitis, streptococcal (02/16/2016),Cerebral thrombosis with cerebral infarction (03/25/2016); Cerebral septic emboli (HCC, NSTEMI, Cardiac arrest (HCC); Cardiogenic shock (HCC); Chronic Systolic and diastolic CHF, Acute respiratory failure with hypoxia (HCC); ; Trichomonal urethritis in male (02/16/2016); Tobacco abuse (02/16/2016); Substance abuse; ETOH abuse;  Admitted again 8/18 with altered mental status, inability to protect airway. He was hypotensive and hypothermic from UTI. Acute resp failure and severe delirium. S/p Trach on 8/18.  During his hospitalization patient was found to have acute/subacute stroke, acute on chronic, respiratory failure with hypoxia secondary to Enterobacter + MRSA HCAP. In addition patient had recently been treated for strep pneumo meningitis, CVA and seizures. Patient has been evaluated by neurology, PC CM, infectious disease, and palliative care. Patient is receiving maximum antibiotic therapy per infectious disease recommendations, has also received appropriate care per neurology recommendations. Unfortunately patient has failed to respond to aggressive treatment and currently is minimally responsive. Because of this status have attempted on multiple occasions to engage wife in order to arrange meeting to discuss short-term vs long-term goals of care, and CODE STATUS. Palliative care arranged meetings on multiple occasions and wife either canceled or failed to show up to each meeting. Palliative care was able to reach wife via phone on 9/1 again to request that a family meeting be arranged however per note patient was not interested in further discussion concerning goals of care, CODE STATUS, or meeting with healthcare team. We therefore will continue aggressive antibiotic treatment per infectious disease, and return patient to [facility]. Patient's  prognosis is extremely poor as evidenced by his multiple admissions starting in April 2017, with continued downward trend in health status.  Consultants:  Dr. Cliffton Asters Infectious disease Dr.Wesam Santa Genera PC CM Dr Drema [place]. Neurology Dr.Gene Neale Burly Palliative care   Procedures/Significant Events:  CT head 8/8 >>old b/l infarcts with subacute infarct again identified at inferior left caudate. No acute process. 8/08 admitted with unresponsiveness, required intubation. 8/09 low cvp, levophed, possible seizure  8/10 not moving rt ext upper and lowers EEG 8/10 >>suppressed backround MRI brain 8/11 >> multifocal acute Lt MCA ischemic infarcts, possible infarct Rt caudate head MRI brain 8/29:-Moderate-sized confluent left MCA territory infarct, increased in size relative to prior MRI from 07/15/2016. - largely subacute in appearance.   Cultures 8/08 Urine >> positive Enterobacter 8/08 Blood >> NEG 8/09 Sputum >> positive MDR Enterobacter 8/09 Blood >> NEG 8/12 C diff >> antigen and toxin positive 8/23 Blood>>>1/2 Biofire= MRSa vs MRSE>>> 8/23 Urine>>> 8/23 Sputum>>> GNR >> 8/23 blood right arm positive MRSA 8/23 blood right arm negative 8/24 blood left AC negative 8/24 blood right AC positive MRSA 8/27 urine positive yeast 8/30 blood left arm 1/2 positive coag negative staph (most likely contaminant)    Antimicrobials: Vanc 8/8 >>8/10 Zosyn 8/8 >>8/11 meropenem 8/11>> 8/18 Oral vancomycin 8/12 >> 9/11 Unasyn 8/23>>> 8/27 vanc IV 8/24>>>  9/14 Cefepime 8/27>> 9/4   Discharge Exam: Vitals:   08/10/16 0500 08/10/16 0802 08/10/16 0803 08/10/16 0900  BP: 111/74   (!) 119/104  Pulse: 77   70  Resp: 14   16  Temp:  98.5 F (36.9 C) 98.6 F (37 C)   TempSrc:  Oral Rectal  SpO2: 98%   95%  Weight:      Height:       General: No acute respiratory distress - obtunded - no evidence of discomfort - RUE PICC in place w/ insertion clean and dry  Lungs: no  focal crackles or wheeze - good air movement th/o  Cardiovascular: RRR without rub or murmur Abdomen: Nontender, thin, nondistended, soft, bowel sounds positive - PEG in place w/ insertion site clean and dry - condom cath in place  Extremities: No significant cyanosis, clubbing, or edema bilateral lower extremities     Medication List    STOP taking these medications   bethanechol 10 MG tablet Commonly known as:  URECHOLINE   divalproex 125 MG capsule Commonly known as:  DEPAKOTE SPRINKLE   famotidine 20 MG tablet Commonly known as:  PEPCID Replaced by:  famotidine 40 MG/5ML suspension   LORazepam 0.5 MG tablet Commonly known as:  ATIVAN   potassium chloride SA 20 MEQ tablet Commonly known as:  K-DUR,KLOR-CON   RESOURCE THICKENUP CLEAR Powd   tamsulosin 0.4 MG Caps capsule Commonly known as:  FLOMAX     TAKE these medications   albuterol (2.5 MG/3ML) 0.083% nebulizer solution Commonly known as:  PROVENTIL Take 3 mLs (2.5 mg total) by nebulization every 2 (two) hours as needed for wheezing. What changed:  when to take this   aspirin 325 MG tablet Place 1 tablet (325 mg total) into feeding tube daily. What changed:  how to take this   atorvastatin 20 MG tablet Commonly known as:  LIPITOR Place 1 tablet (20 mg total) into feeding tube daily at 6 PM. What changed:  how to take this   famotidine 40 MG/5ML suspension Commonly known as:  PEPCID Place 2.5 mLs (20 mg total) into feeding tube 2 (two) times daily. Replaces:  famotidine 20 MG tablet   feeding supplement (VITAL AF 1.2 CAL) Liqd Place 1,000 mLs into feeding tube continuous. What changed:  how much to take  how to take this  when to take this   fludrocortisone 0.1 MG tablet Commonly known as:  FLORINEF Place 1 tablet (0.1 mg total) into feeding tube daily. What changed:  how to take this   folic acid 1 MG tablet Commonly known as:  FOLVITE Place 1 tablet (1 mg total) into feeding tube  daily. What changed:  how to take this   free water Soln Place 200 mLs into feeding tube every 8 (eight) hours.   haloperidol lactate 5 MG/ML injection Commonly known as:  HALDOL Inject 0.2-0.4 mLs (1-2 mg total) into the vein every 4 (four) hours as needed.   heparin 5000 UNIT/ML injection Inject 1 mL (5,000 Units total) into the skin every 8 (eight) hours.   hydrocortisone 20 MG tablet Commonly known as:  CORTEF Place 5 tablets (100 mg total) into feeding tube 3 (three) times daily. What changed:  how to take this  when to take this   levETIRAcetam 100 MG/ML solution Commonly known as:  KEPPRA Place 15 mLs (1,500 mg total) into feeding tube 2 (two) times daily. What changed:  how to take this   QUEtiapine 50 MG tablet Commonly known as:  SEROQUEL Place 1 tablet (50 mg total) into feeding tube 2 (two) times daily.   thiamine 100 MG tablet Place 1 tablet (100 mg total) into feeding tube daily. What changed:  how to take this   Valproate Sodium 250 MG/5ML Soln solution Commonly known as:  DEPAKENE Place 15 mLs (  750 mg total) into feeding tube every 8 (eight) hours.   vancomycin 50 mg/mL oral solution Commonly known as:  VANCOCIN Place 10 mLs (500 mg total) into feeding tube every 6 (six) hours.   Vancomycin 750-5 MG/150ML-% Soln Commonly known as:  VANCOCIN Inject 150 mLs (750 mg total) into the vein every 12 (twelve) hours.      No Known Allergies   The results of significant diagnostics from this hospitalization (including imaging, microbiology, ancillary and laboratory) are listed below for reference.    Microbiology: Recent Results (from the past 240 hour(s))  Culture, Urine     Status: Abnormal   Collection Time: 07/31/16  7:00 PM  Result Value Ref Range Status   Specimen Description URINE, CLEAN CATCH  Final   Special Requests NONE  Final   Culture >=100,000 COLONIES/mL YEAST (A)  Final   Report Status 08/03/2016 FINAL  Final  Culture, blood  (routine x 2)     Status: Abnormal   Collection Time: 08/03/16 12:38 PM  Result Value Ref Range Status   Specimen Description BLOOD LEFT ARM  Final   Special Requests IN PEDIATRIC BOTTLE 2.5CC  Final   Culture  Setup Time   Final    GRAM POSITIVE COCCI IN CLUSTERS IN PEDIATRIC BOTTLE CRITICAL RESULT CALLED TO, READ BACK BY AND VERIFIED WITH: L BAJBUS,PHARMD AT 0846 08/06/16 BY L BENFIELD    Culture (A)  Final    STAPHYLOCOCCUS SPECIES (COAGULASE NEGATIVE) THE SIGNIFICANCE OF ISOLATING THIS ORGANISM FROM A SINGLE SET OF BLOOD CULTURES WHEN MULTIPLE SETS ARE DRAWN IS UNCERTAIN. PLEASE NOTIFY THE MICROBIOLOGY DEPARTMENT WITHIN ONE WEEK IF SPECIATION AND SENSITIVITIES ARE REQUIRED.    Report Status 08/07/2016 FINAL  Final  Culture, blood (routine x 2)     Status: None   Collection Time: 08/03/16  4:00 PM  Result Value Ref Range Status   Specimen Description BLOOD LEFT ARM  Final   Special Requests IN PEDIATRIC BOTTLE 2.5CC  Final   Culture NO GROWTH 5 DAYS  Final   Report Status 08/08/2016 FINAL  Final     Labs: Basic Metabolic Panel:  Recent Labs Lab 08/04/16 0451 08/06/16 0338 08/08/16 0328 08/09/16 0244  NA 144 145 144 146*  K 3.0* 3.8 3.4* 3.9  CL 100* 101 98* 104  CO2 36* 34* 36* 34*  GLUCOSE 97 96 90 108*  BUN 14 15 17 18   CREATININE 0.63 0.61 0.55* 0.65  CALCIUM 8.7* 9.1 9.5 9.1   Liver Function Tests:  Recent Labs Lab 08/04/16 0451 08/08/16 0328 08/09/16 0244  AST 14* 16 15  ALT 12* 13* 12*  ALKPHOS 51 65 59  BILITOT 0.3 0.2* 0.5  PROT 5.5* 6.4* 5.6*  ALBUMIN 2.1* 2.5* 2.2*   CBC:  Recent Labs Lab 08/04/16 0451 08/08/16 0328 08/09/16 0244 08/10/16 0400  WBC 9.7 11.6* 12.3* 13.3*  HGB 10.1* 10.9* 9.7* 9.3*  HCT 31.2* 34.7* 30.9* 29.0*  MCV 89.1 91.3 90.4 89.5  PLT 308 302 265 258   BNP (last 3 results)  Recent Labs  03/20/16 1800  BNP 23.8   CBG:  Recent Labs Lab 08/09/16 1852 08/09/16 1932 08/10/16 0034 08/10/16 0448 08/10/16 0744   GLUCAP 55* 110* 121* 107* 97    Signed:  Carolyne Littlesurtis Woods, MD [facility] [phone] pager  F/U exam and additions to this note completed by:  Lonia BloodJeffrey T. Josseline Reddin, MD [facility] Office  [phone]  08/10/2016, 11:39 AM

## 2016-10-30 ENCOUNTER — Inpatient Hospital Stay (HOSPITAL_COMMUNITY)
Admission: EM | Admit: 2016-10-30 | Discharge: 2017-01-05 | DRG: 853 | Disposition: E | Payer: Medicaid Other | Attending: Family Medicine | Admitting: Family Medicine

## 2016-10-30 ENCOUNTER — Inpatient Hospital Stay (HOSPITAL_COMMUNITY): Payer: Medicaid Other

## 2016-10-30 ENCOUNTER — Emergency Department (HOSPITAL_COMMUNITY): Payer: Medicaid Other

## 2016-10-30 ENCOUNTER — Encounter (HOSPITAL_COMMUNITY): Payer: Self-pay | Admitting: Emergency Medicine

## 2016-10-30 DIAGNOSIS — I5042 Chronic combined systolic (congestive) and diastolic (congestive) heart failure: Secondary | ICD-10-CM | POA: Diagnosis present

## 2016-10-30 DIAGNOSIS — J80 Acute respiratory distress syndrome: Secondary | ICD-10-CM | POA: Diagnosis present

## 2016-10-30 DIAGNOSIS — A419 Sepsis, unspecified organism: Secondary | ICD-10-CM

## 2016-10-30 DIAGNOSIS — Z8674 Personal history of sudden cardiac arrest: Secondary | ICD-10-CM | POA: Diagnosis not present

## 2016-10-30 DIAGNOSIS — E271 Primary adrenocortical insufficiency: Secondary | ICD-10-CM | POA: Diagnosis present

## 2016-10-30 DIAGNOSIS — E2740 Unspecified adrenocortical insufficiency: Secondary | ICD-10-CM

## 2016-10-30 DIAGNOSIS — R112 Nausea with vomiting, unspecified: Secondary | ICD-10-CM | POA: Diagnosis present

## 2016-10-30 DIAGNOSIS — J189 Pneumonia, unspecified organism: Secondary | ICD-10-CM | POA: Diagnosis not present

## 2016-10-30 DIAGNOSIS — G931 Anoxic brain damage, not elsewhere classified: Secondary | ICD-10-CM | POA: Diagnosis not present

## 2016-10-30 DIAGNOSIS — J90 Pleural effusion, not elsewhere classified: Secondary | ICD-10-CM | POA: Diagnosis not present

## 2016-10-30 DIAGNOSIS — I480 Paroxysmal atrial fibrillation: Secondary | ICD-10-CM | POA: Diagnosis not present

## 2016-10-30 DIAGNOSIS — Z9689 Presence of other specified functional implants: Secondary | ICD-10-CM

## 2016-10-30 DIAGNOSIS — R6520 Severe sepsis without septic shock: Secondary | ICD-10-CM | POA: Diagnosis present

## 2016-10-30 DIAGNOSIS — E876 Hypokalemia: Secondary | ICD-10-CM | POA: Diagnosis present

## 2016-10-30 DIAGNOSIS — R680 Hypothermia, not associated with low environmental temperature: Secondary | ICD-10-CM | POA: Diagnosis present

## 2016-10-30 DIAGNOSIS — E870 Hyperosmolality and hypernatremia: Secondary | ICD-10-CM | POA: Diagnosis present

## 2016-10-30 DIAGNOSIS — F0390 Unspecified dementia without behavioral disturbance: Secondary | ICD-10-CM | POA: Diagnosis present

## 2016-10-30 DIAGNOSIS — J9601 Acute respiratory failure with hypoxia: Secondary | ICD-10-CM | POA: Diagnosis not present

## 2016-10-30 DIAGNOSIS — R0902 Hypoxemia: Secondary | ICD-10-CM

## 2016-10-30 DIAGNOSIS — J9600 Acute respiratory failure, unspecified whether with hypoxia or hypercapnia: Secondary | ICD-10-CM | POA: Diagnosis not present

## 2016-10-30 DIAGNOSIS — D649 Anemia, unspecified: Secondary | ICD-10-CM | POA: Diagnosis present

## 2016-10-30 DIAGNOSIS — Z9981 Dependence on supplemental oxygen: Secondary | ICD-10-CM | POA: Diagnosis not present

## 2016-10-30 DIAGNOSIS — J9811 Atelectasis: Secondary | ICD-10-CM | POA: Diagnosis not present

## 2016-10-30 DIAGNOSIS — G9349 Other encephalopathy: Secondary | ICD-10-CM | POA: Diagnosis not present

## 2016-10-30 DIAGNOSIS — A0472 Enterocolitis due to Clostridium difficile, not specified as recurrent: Secondary | ICD-10-CM | POA: Diagnosis not present

## 2016-10-30 DIAGNOSIS — Z7952 Long term (current) use of systemic steroids: Secondary | ICD-10-CM

## 2016-10-30 DIAGNOSIS — R140 Abdominal distension (gaseous): Secondary | ICD-10-CM

## 2016-10-30 DIAGNOSIS — N179 Acute kidney failure, unspecified: Secondary | ICD-10-CM | POA: Diagnosis present

## 2016-10-30 DIAGNOSIS — Z515 Encounter for palliative care: Secondary | ICD-10-CM | POA: Diagnosis not present

## 2016-10-30 DIAGNOSIS — Z79899 Other long term (current) drug therapy: Secondary | ICD-10-CM

## 2016-10-30 DIAGNOSIS — R5381 Other malaise: Secondary | ICD-10-CM | POA: Diagnosis present

## 2016-10-30 DIAGNOSIS — Y95 Nosocomial condition: Secondary | ICD-10-CM | POA: Diagnosis not present

## 2016-10-30 DIAGNOSIS — Z978 Presence of other specified devices: Secondary | ICD-10-CM | POA: Diagnosis not present

## 2016-10-30 DIAGNOSIS — Z66 Do not resuscitate: Secondary | ICD-10-CM | POA: Diagnosis not present

## 2016-10-30 DIAGNOSIS — I252 Old myocardial infarction: Secondary | ICD-10-CM | POA: Diagnosis not present

## 2016-10-30 DIAGNOSIS — E43 Unspecified severe protein-calorie malnutrition: Secondary | ICD-10-CM | POA: Diagnosis present

## 2016-10-30 DIAGNOSIS — Z8614 Personal history of Methicillin resistant Staphylococcus aureus infection: Secondary | ICD-10-CM

## 2016-10-30 DIAGNOSIS — A4159 Other Gram-negative sepsis: Principal | ICD-10-CM | POA: Diagnosis present

## 2016-10-30 DIAGNOSIS — J9690 Respiratory failure, unspecified, unspecified whether with hypoxia or hypercapnia: Secondary | ICD-10-CM

## 2016-10-30 DIAGNOSIS — J939 Pneumothorax, unspecified: Secondary | ICD-10-CM

## 2016-10-30 DIAGNOSIS — Z930 Tracheostomy status: Secondary | ICD-10-CM | POA: Diagnosis not present

## 2016-10-30 DIAGNOSIS — N19 Unspecified kidney failure: Secondary | ICD-10-CM | POA: Diagnosis present

## 2016-10-30 DIAGNOSIS — G40909 Epilepsy, unspecified, not intractable, without status epilepticus: Secondary | ICD-10-CM | POA: Diagnosis present

## 2016-10-30 DIAGNOSIS — E785 Hyperlipidemia, unspecified: Secondary | ICD-10-CM | POA: Diagnosis present

## 2016-10-30 DIAGNOSIS — B964 Proteus (mirabilis) (morganii) as the cause of diseases classified elsewhere: Secondary | ICD-10-CM | POA: Diagnosis present

## 2016-10-30 DIAGNOSIS — F17210 Nicotine dependence, cigarettes, uncomplicated: Secondary | ICD-10-CM | POA: Diagnosis present

## 2016-10-30 DIAGNOSIS — J9809 Other diseases of bronchus, not elsewhere classified: Secondary | ICD-10-CM

## 2016-10-30 DIAGNOSIS — R000 Tachycardia, unspecified: Secondary | ICD-10-CM | POA: Diagnosis not present

## 2016-10-30 DIAGNOSIS — Z931 Gastrostomy status: Secondary | ICD-10-CM

## 2016-10-30 DIAGNOSIS — J156 Pneumonia due to other aerobic Gram-negative bacteria: Secondary | ICD-10-CM | POA: Diagnosis present

## 2016-10-30 DIAGNOSIS — Z8673 Personal history of transient ischemic attack (TIA), and cerebral infarction without residual deficits: Secondary | ICD-10-CM | POA: Diagnosis not present

## 2016-10-30 DIAGNOSIS — J9611 Chronic respiratory failure with hypoxia: Secondary | ICD-10-CM | POA: Diagnosis not present

## 2016-10-30 DIAGNOSIS — L8990 Pressure ulcer of unspecified site, unspecified stage: Secondary | ICD-10-CM | POA: Insufficient documentation

## 2016-10-30 DIAGNOSIS — T17590A Other foreign object in bronchus causing asphyxiation, initial encounter: Secondary | ICD-10-CM | POA: Diagnosis not present

## 2016-10-30 DIAGNOSIS — G904 Autonomic dysreflexia: Secondary | ICD-10-CM | POA: Diagnosis present

## 2016-10-30 DIAGNOSIS — Z7982 Long term (current) use of aspirin: Secondary | ICD-10-CM | POA: Diagnosis not present

## 2016-10-30 DIAGNOSIS — Z6821 Body mass index (BMI) 21.0-21.9, adult: Secondary | ICD-10-CM

## 2016-10-30 DIAGNOSIS — T17500A Unspecified foreign body in bronchus causing asphyxiation, initial encounter: Secondary | ICD-10-CM

## 2016-10-30 DIAGNOSIS — J9621 Acute and chronic respiratory failure with hypoxia: Secondary | ICD-10-CM

## 2016-10-30 DIAGNOSIS — R6521 Severe sepsis with septic shock: Secondary | ICD-10-CM | POA: Diagnosis not present

## 2016-10-30 DIAGNOSIS — D72819 Decreased white blood cell count, unspecified: Secondary | ICD-10-CM | POA: Diagnosis present

## 2016-10-30 DIAGNOSIS — G9340 Encephalopathy, unspecified: Secondary | ICD-10-CM | POA: Diagnosis not present

## 2016-10-30 LAB — I-STAT CHEM 8, ED
BUN: 33 mg/dL — ABNORMAL HIGH (ref 6–20)
CALCIUM ION: 1 mmol/L — AB (ref 1.15–1.40)
CHLORIDE: 98 mmol/L — AB (ref 101–111)
Creatinine, Ser: 2.7 mg/dL — ABNORMAL HIGH (ref 0.61–1.24)
GLUCOSE: 82 mg/dL (ref 65–99)
HEMATOCRIT: 35 % — AB (ref 39.0–52.0)
HEMOGLOBIN: 11.9 g/dL — AB (ref 13.0–17.0)
Potassium: 2.1 mmol/L — CL (ref 3.5–5.1)
SODIUM: 147 mmol/L — AB (ref 135–145)
TCO2: 36 mmol/L (ref 0–100)

## 2016-10-30 LAB — COMPREHENSIVE METABOLIC PANEL
ALK PHOS: 51 U/L (ref 38–126)
ALT: 43 U/L (ref 17–63)
ANION GAP: 14 (ref 5–15)
AST: 75 U/L — ABNORMAL HIGH (ref 15–41)
Albumin: 2 g/dL — ABNORMAL LOW (ref 3.5–5.0)
BILIRUBIN TOTAL: 0.8 mg/dL (ref 0.3–1.2)
BUN: 32 mg/dL — ABNORMAL HIGH (ref 6–20)
CALCIUM: 8.2 mg/dL — AB (ref 8.9–10.3)
CO2: 33 mmol/L — ABNORMAL HIGH (ref 22–32)
Chloride: 98 mmol/L — ABNORMAL LOW (ref 101–111)
Creatinine, Ser: 2.72 mg/dL — ABNORMAL HIGH (ref 0.61–1.24)
GFR, EST AFRICAN AMERICAN: 29 mL/min — AB (ref >60)
GFR, EST NON AFRICAN AMERICAN: 25 mL/min — AB (ref >60)
GLUCOSE: 87 mg/dL (ref 65–99)
Potassium: 2.6 mmol/L — CL (ref 3.5–5.1)
Sodium: 145 mmol/L (ref 135–145)
TOTAL PROTEIN: 5.6 g/dL — AB (ref 6.5–8.1)

## 2016-10-30 LAB — BLOOD GAS, ARTERIAL
ACID-BASE EXCESS: 4.8 mmol/L — AB (ref 0.0–2.0)
Acid-Base Excess: 5.5 mmol/L — ABNORMAL HIGH (ref 0.0–2.0)
BICARBONATE: 28.1 mmol/L — AB (ref 20.0–28.0)
Bicarbonate: 29.1 mmol/L — ABNORMAL HIGH (ref 20.0–28.0)
DRAWN BY: 221791
Drawn by: 105551
FIO2: 100
FIO2: 100
LHR: 22 {breaths}/min
O2 SAT: 93.4 %
O2 Saturation: 87.8 %
PATIENT TEMPERATURE: 36.1
PCO2 ART: 51.5 mmHg — AB (ref 32.0–48.0)
PCO2 ART: 40 mmHg (ref 32.0–48.0)
PEEP: 10 cmH2O
PH ART: 7.378 (ref 7.350–7.450)
PH ART: 7.466 — AB (ref 7.350–7.450)
PO2 ART: 63.6 mmHg — AB (ref 83.0–108.0)
PO2 ART: 67.9 mmHg — AB (ref 83.0–108.0)
Patient temperature: 38.2
VT: 420 mL

## 2016-10-30 LAB — CBC WITH DIFFERENTIAL/PLATELET
BASOS ABS: 0 10*3/uL (ref 0.0–0.1)
Basophils Relative: 1 %
EOS ABS: 0 10*3/uL (ref 0.0–0.7)
Eosinophils Relative: 0 %
HCT: 37.2 % — ABNORMAL LOW (ref 39.0–52.0)
Hemoglobin: 11.9 g/dL — ABNORMAL LOW (ref 13.0–17.0)
LYMPHS PCT: 39 %
Lymphs Abs: 1.3 10*3/uL (ref 0.7–4.0)
MCH: 30.4 pg (ref 26.0–34.0)
MCHC: 32 g/dL (ref 30.0–36.0)
MCV: 95.1 fL (ref 78.0–100.0)
Monocytes Absolute: 0.2 10*3/uL (ref 0.1–1.0)
Monocytes Relative: 7 %
NEUTROS ABS: 1.9 10*3/uL (ref 1.7–7.7)
NEUTROS PCT: 53 %
PLATELETS: 218 10*3/uL (ref 150–400)
RBC: 3.91 MIL/uL — ABNORMAL LOW (ref 4.22–5.81)
RDW: 16.8 % — ABNORMAL HIGH (ref 11.5–15.5)
WBC: 3.4 10*3/uL — ABNORMAL LOW (ref 4.0–10.5)

## 2016-10-30 LAB — INFLUENZA PANEL BY PCR (TYPE A & B)
INFLAPCR: NEGATIVE
INFLBPCR: NEGATIVE

## 2016-10-30 LAB — URINE MICROSCOPIC-ADD ON

## 2016-10-30 LAB — URINALYSIS, ROUTINE W REFLEX MICROSCOPIC
BILIRUBIN URINE: NEGATIVE
Glucose, UA: NEGATIVE mg/dL
KETONES UR: NEGATIVE mg/dL
Leukocytes, UA: NEGATIVE
Nitrite: NEGATIVE
PROTEIN: 30 mg/dL — AB
Specific Gravity, Urine: <1.005 — ABNORMAL LOW (ref 1.005–1.030)
pH: 7 (ref 5.0–8.0)

## 2016-10-30 LAB — I-STAT CG4 LACTIC ACID, ED
Lactic Acid, Venous: 6.18 mmol/L (ref 0.5–1.9)
Lactic Acid, Venous: 5.38 mmol/L (ref 0.5–1.9)

## 2016-10-30 LAB — MAGNESIUM: Magnesium: 1.9 mg/dL (ref 1.7–2.4)

## 2016-10-30 LAB — CBG MONITORING, ED: GLUCOSE-CAPILLARY: 86 mg/dL (ref 65–99)

## 2016-10-30 MED ORDER — SODIUM CHLORIDE 0.9 % IV BOLUS (SEPSIS)
2000.0000 mL | Freq: Once | INTRAVENOUS | Status: AC
  Administered 2016-10-31: 2000 mL via INTRAVENOUS

## 2016-10-30 MED ORDER — HEPARIN SODIUM (PORCINE) 5000 UNIT/ML IJ SOLN
5000.0000 [IU] | Freq: Three times a day (TID) | INTRAMUSCULAR | Status: DC
  Administered 2016-10-31 – 2016-12-16 (×139): 5000 [IU] via SUBCUTANEOUS
  Filled 2016-10-30 (×137): qty 1

## 2016-10-30 MED ORDER — POTASSIUM CHLORIDE 10 MEQ/100ML IV SOLN
10.0000 meq | INTRAVENOUS | Status: DC
  Administered 2016-10-30 (×4): 10 meq via INTRAVENOUS
  Filled 2016-10-30 (×4): qty 100

## 2016-10-30 MED ORDER — VITAMIN B-1 100 MG PO TABS
100.0000 mg | ORAL_TABLET | Freq: Every day | ORAL | Status: DC
  Administered 2016-10-31 – 2016-12-16 (×47): 100 mg
  Filled 2016-10-30 (×48): qty 1

## 2016-10-30 MED ORDER — SODIUM CHLORIDE 0.9 % IV BOLUS (SEPSIS)
500.0000 mL | Freq: Once | INTRAVENOUS | Status: AC
  Administered 2016-10-30: 500 mL via INTRAVENOUS

## 2016-10-30 MED ORDER — HYDROCORTISONE NA SUCCINATE PF 100 MG IJ SOLR
50.0000 mg | Freq: Three times a day (TID) | INTRAMUSCULAR | Status: DC
  Administered 2016-10-31 – 2016-11-05 (×17): 50 mg via INTRAVENOUS
  Filled 2016-10-30 (×17): qty 2

## 2016-10-30 MED ORDER — SODIUM CHLORIDE 0.9 % IV SOLN
25.0000 ug/h | INTRAVENOUS | Status: DC
  Administered 2016-10-30: 25 ug/h via INTRAVENOUS
  Filled 2016-10-30: qty 50

## 2016-10-30 MED ORDER — SODIUM CHLORIDE 0.9 % IV BOLUS (SEPSIS)
1000.0000 mL | Freq: Once | INTRAVENOUS | Status: AC
  Administered 2016-10-30 (×2): 1000 mL via INTRAVENOUS

## 2016-10-30 MED ORDER — PIPERACILLIN-TAZOBACTAM 3.375 G IVPB
3.3750 g | Freq: Three times a day (TID) | INTRAVENOUS | Status: DC
  Administered 2016-10-31 – 2016-11-02 (×8): 3.375 g via INTRAVENOUS
  Filled 2016-10-30 (×10): qty 50

## 2016-10-30 MED ORDER — SODIUM CHLORIDE 0.9 % IV SOLN
1.0000 mg/h | INTRAVENOUS | Status: DC
  Administered 2016-10-30 – 2016-10-31 (×2): 1 mg/h via INTRAVENOUS
  Filled 2016-10-30 (×2): qty 10

## 2016-10-30 MED ORDER — NALOXONE HCL 2 MG/2ML IJ SOSY
1.0000 mg | PREFILLED_SYRINGE | Freq: Once | INTRAMUSCULAR | Status: AC
  Administered 2016-10-30: 1 mg via INTRAVENOUS
  Filled 2016-10-30: qty 2

## 2016-10-30 MED ORDER — FENTANYL 2500MCG IN NS 250ML (10MCG/ML) PREMIX INFUSION
INTRAVENOUS | Status: AC
  Filled 2016-10-30: qty 250

## 2016-10-30 MED ORDER — SODIUM CHLORIDE 0.9 % IV SOLN
250.0000 mL | INTRAVENOUS | Status: DC | PRN
  Administered 2016-10-31: 250 mL via INTRAVENOUS
  Administered 2016-11-01: 500 mL via INTRAVENOUS
  Administered 2016-11-04: 12:00:00 via INTRAVENOUS

## 2016-10-30 MED ORDER — ETOMIDATE 2 MG/ML IV SOLN
0.3000 mg/kg | Freq: Once | INTRAVENOUS | Status: AC
  Administered 2016-10-30: 19.32 mg via INTRAVENOUS

## 2016-10-30 MED ORDER — FREE WATER
300.0000 mL | Status: DC
  Administered 2016-10-30: 300 mL

## 2016-10-30 MED ORDER — VANCOMYCIN HCL IN DEXTROSE 1-5 GM/200ML-% IV SOLN
1000.0000 mg | Freq: Once | INTRAVENOUS | Status: AC
  Administered 2016-10-30: 1000 mg via INTRAVENOUS
  Filled 2016-10-30: qty 200

## 2016-10-30 MED ORDER — VALPROATE SODIUM 250 MG/5ML PO SOLN
750.0000 mg | Freq: Three times a day (TID) | ORAL | Status: DC
  Administered 2016-10-31 – 2016-12-23 (×160): 750 mg
  Filled 2016-10-30 (×178): qty 15

## 2016-10-30 MED ORDER — PIPERACILLIN-TAZOBACTAM 3.375 G IVPB 30 MIN
3.3750 g | Freq: Once | INTRAVENOUS | Status: AC
Start: 2016-10-30 — End: 2016-10-30
  Administered 2016-10-30: 3.375 g via INTRAVENOUS
  Filled 2016-10-30: qty 50

## 2016-10-30 MED ORDER — SODIUM CHLORIDE 0.9 % IV SOLN
30.0000 meq | Freq: Once | INTRAVENOUS | Status: AC
  Administered 2016-10-31: 30 meq via INTRAVENOUS
  Filled 2016-10-30: qty 15

## 2016-10-30 MED ORDER — ATORVASTATIN CALCIUM 20 MG PO TABS
20.0000 mg | ORAL_TABLET | Freq: Every day | ORAL | Status: DC
  Administered 2016-10-31 – 2016-12-15 (×46): 20 mg
  Filled 2016-10-30 (×47): qty 1

## 2016-10-30 MED ORDER — VANCOMYCIN HCL IN DEXTROSE 750-5 MG/150ML-% IV SOLN
750.0000 mg | INTRAVENOUS | Status: DC
  Administered 2016-10-31: 750 mg via INTRAVENOUS
  Filled 2016-10-30 (×2): qty 150

## 2016-10-30 MED ORDER — HYDROCORTISONE NA SUCCINATE PF 100 MG IJ SOLR
100.0000 mg | Freq: Once | INTRAMUSCULAR | Status: AC
  Administered 2016-10-30: 100 mg via INTRAVENOUS
  Filled 2016-10-30: qty 2

## 2016-10-30 MED ORDER — FOLIC ACID 1 MG PO TABS
1.0000 mg | ORAL_TABLET | Freq: Every day | ORAL | Status: DC
  Administered 2016-10-31 – 2016-12-16 (×47): 1 mg
  Filled 2016-10-30 (×47): qty 1

## 2016-10-30 MED ORDER — VITAL AF 1.2 CAL PO LIQD
1000.0000 mL | ORAL | Status: DC
  Administered 2016-10-31 – 2016-12-21 (×59): 1000 mL
  Filled 2016-10-30 (×90): qty 1000

## 2016-10-30 MED ORDER — ROCURONIUM BROMIDE 50 MG/5ML IV SOLN
1.0000 mg/kg | Freq: Once | INTRAVENOUS | Status: AC
  Administered 2016-10-30: 64.4 mg via INTRAVENOUS
  Filled 2016-10-30: qty 6.44

## 2016-10-30 MED ORDER — FAMOTIDINE 40 MG/5ML PO SUSR
20.0000 mg | Freq: Two times a day (BID) | ORAL | Status: DC
  Administered 2016-10-31 – 2016-12-23 (×108): 20 mg
  Filled 2016-10-30 (×110): qty 2.5

## 2016-10-30 MED ORDER — FREE WATER: 200.0000 mL | Freq: Three times a day (TID) | Status: DC

## 2016-10-30 MED ORDER — MIDAZOLAM 50MG/50ML (1MG/ML) PREMIX INFUSION
INTRAVENOUS | Status: AC
  Filled 2016-10-30: qty 50

## 2016-10-30 MED ORDER — SODIUM CHLORIDE 0.9 % IV BOLUS (SEPSIS)
1000.0000 mL | Freq: Once | INTRAVENOUS | Status: AC
  Administered 2016-10-30: 1000 mL via INTRAVENOUS

## 2016-10-30 MED ORDER — LEVETIRACETAM 100 MG/ML PO SOLN
1500.0000 mg | Freq: Two times a day (BID) | ORAL | Status: DC
  Administered 2016-10-31 – 2016-12-23 (×108): 1500 mg
  Filled 2016-10-30 (×111): qty 15

## 2016-10-30 NOTE — ED Notes (Signed)
RT at bedside.

## 2016-10-30 NOTE — ED Notes (Signed)
CRITICAL VALUE ALERT  Critical value received:  2.6  Date of notification:  10/28/2016  Time of notification:  1625  Critical value read back:  Yes  Nurse who received alert:  Sophronia SimasJackie Galadriel Shroff  MD notified (1st page):  Dr. Criss AlvineGoldston

## 2016-10-30 NOTE — ED Notes (Signed)
ED Provider at bedside. 

## 2016-10-30 NOTE — ED Provider Notes (Signed)
[facility] DEPT Provider Note   CSN: [id number] Arrival date & time: 10/19/2016  1525   LEVEL 5 CAVEAT - PATIENT NON VERBAL  History   Chief Complaint Chief Complaint  Patient presents with  . Diarrhea    HPI Dale [name] is a 50 y.o. male.  HPI  50 year old male resents from his nursing facility with a fever. History is taken from the nurse that takes care of him at the facility over the phone. Patient chronically is nonverbal but usually opens his eyes. This has been since meningitis back in March 2017. Patient started running a fever today at around 1 PM. 101 axillary. Given Tylenol. An hour later was 104.7 axillary. He has vomited twice and had loose stools. Also noticed to have increased respiratory rates up he was sent to the ER. Patient was also given us Phenergan suppository 11:30. Normally is on around 2 L oxygen. Has multiple admissions and recently had a trach placed. He pulled out the trach about a week ago and it was not replaced. I discussed with the spouse who is his power of attorney and she indicates he is a full code at this time.  Past Medical History:  Diagnosis Date  . Acute encephalopathy   . Acute ischemic stroke (HCC)   . Acute renal failure (HCC)   . Acute respiratory failure with hypoxia (HCC)   . AKI (acute kidney injury) (HCC) 02/16/2016  . Altered mental status   . Cardiac arrest (HCC)   . Cardiogenic shock (HCC)   . Cerebral septic emboli (HCC)   . Cerebral thrombosis with cerebral infarction 03/25/2016  . CHF (congestive heart failure) (HCC)   . Convulsions/seizures (HCC) 05/04/2016   Seizure disorder - Continue Keppra, Depakote.     . Dementia 04/27/2016  . Depression   . Dysphagia 05/04/2016   Dysphagia - Dysphagia 1 diet per SLP     . ETOH abuse   . Hydrocephalus   . Hyperlipidemia 05/04/2016   Dyslipidemia - Continue statin.     Marland Kitchen. Hypernatremia   . Hypothermia 03/20/2016  . Low serum cortisol level (HCC) 05/04/2016   Low a.m. cortisol  -  Was 1.2 checked on 04/03/2016  - This likely is reflective of cortisol suppression from steroid use - Tapered steroids off   . Meningitis, pneumococcal, recurrent   . Meningitis, streptococcal 02/16/2016  . Meningoencephalitis   . NSTEMI (non-ST elevated myocardial infarction) (HCC) 03/14/2016  . Protein-calorie malnutrition, severe (HCC)   . Respiratory failure (HCC) 03/03/2016  . Septic shock (HCC)   . Severe sepsis (HCC)   . 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 Substance abuse   . Systolic and diastolic CHF, chronic (HCC)   . Tobacco abuse 02/16/2016  . 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 Urinary retention 05/04/2016   Urinary retention - Continue Flomax       Patient Active Problem List   Diagnosis Date Noted  . Respiratory failure (HCC) 10/22/2016  . MRSA bacteremia 07/28/2016  . C. difficile colitis 07/28/2016  . Encephalopathy 07/12/2016  . Arterial hypotension   . Adrenal insufficiency (Addison's disease) (HCC)   . Bradycardia   . Hypoglycemia 07/02/2016  . Seizures (HCC)   . Dysphagia 05/04/2016  . Protein-calorie malnutrition (HCC)   . Palliative care encounter   . Cerebral thrombosis with cerebral infarction 03/25/2016  . Acute CVA (cerebrovascular accident) (HCC)   . Streptococcus pneumoniae meningitis   . Chronic combined systolic and diastolic CHF (congestive heart failure) (HCC)   .  Hypothermia 03/20/2016  . Tracheostomy status (HCC)   . Goals of care, counseling/discussion   . Acute on chronic respiratory failure with hypoxia (HCC) 03/03/2016  . Septic shock (HCC)   . Altered mental state   . History of bacterial meningitis 02/16/2016  . Acute encephalopathy   . Acute respiratory failure [facility])     Past Surgical History:  Procedure Laterality Date  . ANKLE SURGERY    . CARDIAC CATHETERIZATION N/A 02/17/2016   Procedure: IABP Insertion;  Surgeon: Iran Ouch, MD;  Location: [facility] INVASIVE CV LAB;  Service: Cardiovascular;  Laterality: N/A;  . IR  GENERIC HISTORICAL  08/01/2016   IR GASTROSTOMY TUBE MOD SED 08/01/2016 Irish Lack, MD [facility]-INTER[facility] RAD  . TEE WITHOUT CARDIOVERSION N/A 03/29/2016   Procedure: TRANSESOPHAGEAL ECHOCARDIOGRAM (TEE);  Surgeon: Laurey Morale, MD;  Location: [facility] ENDOSCOPY;  Service: Cardiovascular;  Laterality: N/A;       Home Medications    Prior to Admission medications   Medication Sig Start Date End Date Taking? Authorizing Provider  acetaminophen (TYLENOL) 325 MG tablet Take 650 mg by mouth 2 (two) times daily.   Yes Historical Provider, MD  albuterol (PROVENTIL) (2.5 MG/3ML) 0.083% nebulizer solution Take 3 mLs (2.5 mg total) by nebulization every 2 (two) hours as needed for wheezing. 08/09/16  Yes Drema Dallas, MD  aspirin 325 MG tablet Place 1 tablet (325 mg total) into feeding tube daily. 08/09/16  Yes Drema Dallas, MD  atorvastatin (LIPITOR) 20 MG tablet Place 1 tablet (20 mg total) into feeding tube daily at 6 PM. 08/09/16  Yes Drema Dallas, MD  clonazePAM (KLONOPIN) 0.5 MG tablet Take 0.5 mg by mouth 4 (four) times daily.   Yes Historical Provider, MD  famotidine (PEPCID) 40 MG/5ML suspension Place 2.5 mLs (20 mg total) into feeding tube 2 (two) times daily. 08/09/16  Yes Drema Dallas, MD  fludrocortisone (FLORINEF) 0.1 MG tablet Place 1 tablet (0.1 mg total) into feeding tube daily. 08/09/16  Yes Drema Dallas, MD  folic acid (FOLVITE) 1 MG tablet Place 1 tablet (1 mg total) into feeding tube daily. 08/09/16  Yes Drema Dallas, MD  heparin 5000 UNIT/ML injection Inject 1 mL (5,000 Units total) into the skin every 8 (eight) hours. 08/09/16  Yes Drema Dallas, MD  hydrocortisone (CORTEF) 20 MG tablet Place 5 tablets (100 mg total) into feeding tube 3 (three) times daily. 08/09/16  Yes Drema Dallas, MD  levETIRAcetam (KEPPRA) 100 MG/ML solution Place 15 mLs (1,500 mg total) into feeding tube 2 (two) times daily. 08/09/16  Yes Drema Dallas, MD  LORazepam (ATIVAN) 1 MG tablet Place 1 mg into feeding tube  every 6 (six) hours as needed for anxiety (extreme agitation).   Yes Historical Provider, MD  Nutritional Supplements (FEEDING SUPPLEMENT, OSMOLITE 1.5 CAL,) LIQD Place 1,000 mLs into feeding tube continuous.   Yes Historical Provider, MD  OXYGEN Inhale 2 L into the lungs daily.   Yes Historical Provider, MD  QUEtiapine (SEROQUEL) 50 MG tablet Place 1 tablet (50 mg total) into feeding tube 2 (two) times daily. Patient taking differently: Place 25 mg into feeding tube 2 (two) times daily.  08/09/16  Yes Drema Dallas, MD  thiamine 100 MG tablet Place 1 tablet (100 mg total) into feeding tube daily. 08/09/16  Yes Drema Dallas, MD  Valproate Sodium (DEPAKENE) 250 MG/5ML SOLN solution Place 15 mLs (750 mg total) into feeding tube every 8 (eight) hours. 08/09/16  Yes Drema Dallas, MD  Water For Irrigation, Sterile (FREE WATER) SOLN Place 200 mLs into feeding tube every 8 (eight) hours. 08/09/16  Yes Drema Dallas, MD  haloperidol lactate (HALDOL) 5 MG/ML injection Inject 0.2-0.4 mLs (1-2 mg total) into the vein every 4 (four) hours as needed. Patient not taking: Reported on 10/24/2016 08/09/16   Drema Dallas, MD  Nutritional Supplements (FEEDING SUPPLEMENT, VITAL AF 1.2 CAL,) LIQD Place 1,000 mLs into feeding tube continuous. Patient not taking: Reported on 10/06/2016 08/09/16   Drema Dallas, MD  vancomycin (VANCOCIN) 50 mg/mL oral solution Place 10 mLs (500 mg total) into feeding tube every 6 (six) hours. Patient not taking: Reported on 10/14/2016 08/09/16   Drema Dallas, MD  Vancomycin [facility]) 750-5 MG/150ML-% SOLN Inject 150 mLs (750 mg total) into the vein every 12 (twelve) hours. Patient not taking: Reported on 10/22/2016 08/09/16   Drema Dallas, MD    Family History History reviewed. No pertinent family history.  Social History Social History  Substance Use Topics  . Smoking status: Current Every Day Smoker    Packs/day: 0.10    Types: Cigarettes  . Smokeless tobacco: Never Used  .  Alcohol use No     Allergies   Patient has no known allergies.   Review of Systems Review of Systems  Unable to perform ROS: Mental status change     Physical Exam Updated Vital Signs BP (!) 86/69   Pulse 102   Temp 97.2 F (36.2 C)   Resp 22   Ht 5\' 8"  (1.727 m)   Wt 142 lb (64.4 kg)   SpO2 98%   BMI 21.59 kg/m   Physical Exam  Constitutional: He appears well-developed and well-nourished. He appears lethargic.  Weak sounding cough  HENT:  Head: Normocephalic and atraumatic.  Right Ear: External ear normal.  Left Ear: External ear normal.  Nose: Nose normal.  Eyes: Right eye exhibits no discharge. Left eye exhibits no discharge.  Miotic pupils bilaterally  Neck: Normal range of motion. Neck supple.  Trach scar  Cardiovascular: Regular rhythm and normal heart sounds.  Tachycardia present.   Pulmonary/Chest: Effort normal. Tachypnea noted. He has rhonchi.  Diffuse rhonchi  Abdominal: Soft. He exhibits no distension. There is no tenderness.  Musculoskeletal: He exhibits no edema.  Neurological: He appears lethargic.  nonresponsive to pain and verbal stimuli. On and off will move extremities or take off pulse ox.  Skin: Skin is warm and dry.  Nursing note and vitals reviewed.    ED Treatments / Results  Labs (all labs ordered are listed, but only abnormal results are displayed) Labs Reviewed  COMPREHENSIVE METABOLIC PANEL - Abnormal; Notable for the following:       Result Value   Potassium 2.6 (*)    Chloride 98 (*)    CO2 33 (*)    BUN 32 (*)    Creatinine, Ser 2.72 (*)    Calcium 8.2 (*)    Total Protein 5.6 (*)    Albumin 2.0 (*)    AST 75 (*)    GFR calc non Af Amer 25 (*)    GFR calc Af Amer 29 (*)    All other components within normal limits  CBC WITH DIFFERENTIAL/PLATELET - Abnormal; Notable for the following:    WBC 3.4 (*)    RBC 3.91 (*)    Hemoglobin 11.9 (*)    HCT 37.2 (*)    RDW 16.8 (*)    All  other components within normal limits   URINALYSIS, ROUTINE W REFLEX MICROSCOPIC (NOT AT [facility]) - Abnormal; Notable for the following:    Specific Gravity, Urine <1.005 (*)    Hgb urine dipstick LARGE (*)    Protein, ur 30 (*)    All other components within normal limits  BLOOD GAS, ARTERIAL - Abnormal; Notable for the following:    pCO2 arterial 51.5 (*)    pO2, Arterial 63.6 (*)    Bicarbonate 28.1 (*)    Acid-Base Excess 4.8 (*)    All other components within normal limits  BLOOD GAS, ARTERIAL - Abnormal; Notable for the following:    pH, Arterial 7.466 (*)    pO2, Arterial 67.9 (*)    Bicarbonate 29.1 (*)    Acid-Base Excess 5.5 (*)    All other components within normal limits  URINE MICROSCOPIC-ADD ON - Abnormal; Notable for the following:    Squamous Epithelial / LPF 6-30 (*)    Bacteria, UA FEW (*)    All other components within normal limits  I-STAT CG4 LACTIC ACID, ED - Abnormal; Notable for the following:    Lactic Acid, Venous 5.38 (*)    All other components within normal limits  I-STAT CHEM 8, ED - Abnormal; Notable for the following:    Sodium 147 (*)    Potassium 2.1 (*)    Chloride 98 (*)    BUN 33 (*)    Creatinine, Ser 2.70 (*)    Calcium, Ion 1.00 (*)    Hemoglobin 11.9 (*)    HCT 35.0 (*)    All other components within normal limits  I-STAT CG4 LACTIC ACID, ED - Abnormal; Notable for the following:    Lactic Acid, Venous 6.18 (*)    All other components within normal limits  CULTURE, BLOOD (ROUTINE X 2)  CULTURE, BLOOD (ROUTINE X 2)  URINE CULTURE  C DIFFICILE QUICK SCREEN W PCR REFLEX  RESPIRATORY PANEL BY PCR  INFLUENZA PANEL BY PCR (TYPE A & B, H1N1)  MAGNESIUM  CBC  CBG MONITORING, ED    EKG  EKG Interpretation  Date/Time:  Sunday October 30 2016 15:34:35 EST Ventricular Rate:  136 PR Interval:    QRS Duration: 82 QT Interval:  294 QTC Calculation: 443 R Axis:   58 Text Interpretation:  Sinus tachycardia Probable LVH with secondary repol abnrm rate significantly faster  than Aug 2017, nonspecific ST/T changes Confirmed by Criss AlvineGOLDSTON MD, Yashua Bracco [phone]([id number]) on 10/06/2016 3:58:55 PM       Radiology Dg Chest Portable 1 View  Result Date: 11/02/2016 CLINICAL DATA:  Status post intubation. EXAM: PORTABLE CHEST 1 VIEW COMPARISON:  One-view chest x-ray from the same day at 4:10 p.m. FINDINGS: The patient is an avid intubated. The endotracheal tube terminates 3.4 cm above the carina, in satisfactory position. Left upper lobe pneumonia is again seen. Extensive asymmetric edema is present on the left as well. Bibasilar airspace disease likely reflects atelectasis. Basilar disease is likely exaggerated by reversed lordotic positioning. IMPRESSION: 1. Satisfactory positioning of the endotracheal tube 3.5 cm above the carina. 2. Persistent left lobe pneumonia. 3. Asymmetric left-sided edema. 4. Bibasilar airspace disease likely reflects atelectasis. Electronically Signed   By: Marin Robertshristopher  Mattern M.D.   On: 10/26/2016 18:19   Dg Chest Port 1 View  Result Date: 10/29/2016 CLINICAL DATA:  Code sepsis, nausea, vomiting, diarrhea, fever. EXAM: PORTABLE CHEST 1 VIEW COMPARISON:  08/10/2016. FINDINGS: Trachea is midline. Heart size is accentuated by portable AP supine technique. There  is airspace consolidation in the left upper lobe. Minimal consolidation in the medial aspect of the lower right hemi thorax. No pleural fluid. IMPRESSION: 1. Left upper lobe pneumonia. Followup PA and lateral chest X-ray is recommended in 3-4 weeks following trial of antibiotic therapy to ensure resolution and exclude underlying malignancy. 2. Atelectasis or pneumonia at the medial right lung base. Electronically Signed   By: Leanna Battles M.D.   On: 10/29/2016 16:37    Procedures Procedure Name: Intubation Date/Time: 10/23/2016 5:36 PM Performed by: Pricilla Loveless Pre-anesthesia Checklist: Patient identified, Emergency Drugs available, Suction available and Patient being monitored Oxygen Delivery  Method: Ambu bag Preoxygenation: Pre-oxygenation with 100% oxygen Intubation Type: IV induction and Rapid sequence Ventilation: Mask ventilation without difficulty Laryngoscope Size: Glidescope and 3 Grade View: Grade I Tube size: 7.0 mm Number of attempts: 1 Airway Equipment and Method: Video-laryngoscopy Placement Confirmation: ETT inserted through vocal cords under direct vision,  Positive ETCO2 and Breath sounds checked- equal and bilateral Secured at: 22 cm Tube secured with: ETT holder Dental Injury: Teeth and Oropharynx as per pre-operative assessment       (including critical care time)  CRITICAL CARE Performed by: Pricilla Loveless T   Total critical care time: 60 minutes  Critical care time was exclusive of separately billable procedures and treating other patients.  Critical care was necessary to treat or prevent imminent or life-threatening deterioration.  Critical care was time spent personally by me on the following activities: development of treatment plan with patient and/or surrogate as well as nursing, discussions with consultants, evaluation of patient's response to treatment, examination of patient, obtaining history from patient or surrogate, ordering and performing treatments and interventions, ordering and review of laboratory studies, ordering and review of radiographic studies, pulse oximetry and re-evaluation of patient's condition.   Medications Ordered in ED Medications  potassium chloride 10 mEq in 100 mL IVPB (10 mEq Intravenous Transfusing/Transfer 10/18/2016 2000)  piperacillin-tazobactam (ZOSYN) IVPB 3.375 g (not administered)  vancomycin (VANCOCIN) IVPB 750 mg/150 ml premix (not administered)  fentaNYL (SUBLIMAZE) 2,500 mcg in sodium chloride 0.9 % 250 mL (10 mcg/mL) infusion (25 mcg/hr Intravenous Transfusing/Transfer 11/02/2016 1950)  midazolam (VERSED) 50 mg in sodium chloride 0.9 % 50 mL (1 mg/mL) infusion (1 mg/hr Intravenous Transfusing/Transfer  10/20/2016 1950)  sodium chloride 0.9 % bolus 1,000 mL (0 mLs Intravenous Stopped 10/11/2016 1617)    And  sodium chloride 0.9 % bolus 1,000 mL (0 mLs Intravenous Stopped 10/22/2016 1649)  piperacillin-tazobactam (ZOSYN) IVPB 3.375 g (0 g Intravenous Stopped 10/26/2016 1637)  vancomycin (VANCOCIN) IVPB 1000 mg/200 mL premix (0 mg Intravenous Stopped 10/21/2016 1731)  hydrocortisone sodium succinate (SOLU-CORTEF) 100 MG injection 100 mg (100 mg Intravenous Given 10/10/2016 1619)  naloxone (NARCAN) injection 1 mg (1 mg Intravenous Given 10/12/2016 1619)  etomidate (AMIDATE) injection 19.32 mg (19.32 mg Intravenous Given 10/16/2016 1759)  rocuronium (ZEMURON) injection 64.4 mg (64.4 mg Intravenous Given 10/27/2016 1729)  sodium chloride 0.9 % bolus 500 mL (500 mLs Intravenous Transfusing/Transfer 10/11/2016 2001)     Initial Impression / Assessment and Plan / ED Course  I have reviewed the triage vital signs and the nursing notes.  Pertinent labs & imaging results that were available during my care of the patient were reviewed by me and considered in my medical decision making (see chart for details).  Clinical Course as of Oct 30 2054  Wynelle Link Oct 30, 2016  1553 Patient appears septic. Minimally responsive, placed on NRB due to hypoxia, will need intubation if  he does not improve but I would like to resuscitate him first if possible  [SG]  1557 Given history of adrenal insufficiency and hypotension, will give hydrocortisone IV as well in addition to fluids  [SG]  1630 Given he has nonresponsive at baseline, I think he is actually a lot closer to baseline then I saw previously. He is moving and seems disoriented but still not talking. He is much more awake. I think he would need to be intubated if his mental status worsens or if he has significant hypoxic with his pneumonia.  [SG]  1713 Patient's blood pressure has significant improved. However on supplemental high flow nasal cannula he is still desaturating. He was  placed back on nonrebreather but frequently pulls it off. I think given his severity of illness and likely projected course, he needs to be intubated for airway protection and better oxygen management.  [SG]  1742 Intubated without difficulty. Consult critical care for admission  [SG]  1829 D/w ICU, admit to [facility] ICU, Dr. Katrinka Blazing accepts  [SG]  1909 Lactate only minimally improved to 5.38. D/w Dr. Katrinka Blazing, he has good UOP (over 350 cc in foley now), BP ok, will get ABG.   [SG]  2052 Patient being transported to [facility] via [facility] now. Has had lower SBPs but MAPs have consistently been 70+. No pressors for now.  [SG]    Clinical Course User Index [SG] Pricilla Loveless, MD    Final Clinical Impressions(s) / ED Diagnoses   Final diagnoses:  Acute on chronic respiratory failure with hypoxia (HCC)  Severe sepsis (HCC)  HCAP (healthcare-associated pneumonia)  Acute kidney injury [facility])    New Prescriptions New Prescriptions   No medications on file     Pricilla Loveless, MD 11-18-16 2055

## 2016-10-30 NOTE — ED Triage Notes (Addendum)
Per EMS: Pt from [facility] has had n/v/d since this afternoon, fever of 100.4 en route with ems.  Pts roommate is sick with the same.  EMS states he has not spoken at all to them.  Pt is responsive to voice, but is not alert at this time.  PT was given phenergen and tylenol at [facility].   Hr 143, 83/53 bp

## 2016-10-30 NOTE — ED Notes (Signed)
ABG: (on 15L NRB)  pH 7.378 Pco2 51.5 po2 63.6 bicard 28.1 Sat 87.7

## 2016-10-30 NOTE — ED Notes (Signed)
Pt was placed on NRB by Dr. Criss AlvineGoldston. Pt had oxygen increase to 96%.  EDP gave order to this nurse to change patient to 4L. Pt had decrease in oxygen to 88%.

## 2016-10-30 NOTE — ED Notes (Signed)
Positive color change on CO2. Bilateral breath sounds heard by EDP Goldston  7.0 ET tube, 22 @ lip

## 2016-10-30 NOTE — H&P (Signed)
PULMONARY / CRITICAL CARE MEDICINE   Name: Dale [name] MRN: [id number] DOB: [birth year]    ADMISSION DATE:  11-23-2016  CHIEF COMPLAINT:  Nausea and Vomiting  HISTORY OF PRESENT ILLNESS:   Dale [name] is non-verbal, and history is obtained from the chart. Brief history from Dr. Kendrick Fries 07/12/16: Dale [name] is an 50 y.o. M with PMH as outlined below including admission in March 2017 with strep pneumo meningitis c/b cardiac arrest and cardiogenic shock, ARDS requiring prolonged intubation and trach (now decannulated), and acute renal failure.  He was admitted to [facility]/16/16 through 07/11/16 for recurrent hypoglycemia due to adrenal insufficiency, new subacute CVA, seizures with inability to protect airway s/p intubation 7/13 through 7/21.  He re-presented 8/08 again with sepsis from UTI, and was intubated due to AMS, and was trached 8/18, and was discharged with a cuffless trach. That hospitalization was c/b MRSA bacteremia 8/24, and completed 3 weeks of IV vanc. He also developed C. Diff, and was treated with PO vanc through 9/11. He has had continued autonomic dysreflexia, which worsens during complications of acute illnesses.  Prior to this presentation, he had been well, although he was decannulated for reasons that are unclear. He was brought to the ED with decreased level of consciousness and nausea/vomiting/diarrhea. A CXR showed consolidation of the left upper lobe. He was intubated and transferred to [facility].  There have been significant amount of conversations with his wife with regard to goals of care, but she has insisted on full support. He is full code.  PAST MEDICAL HISTORY :  He  has a past medical history of Acute encephalopathy; Acute ischemic stroke (HCC); Acute renal failure (HCC); Acute respiratory failure with hypoxia (HCC); AKI (acute kidney injury) (HCC) (02/16/2016); Altered mental status; Cardiac arrest (HCC); Cardiogenic shock (HCC); Cerebral septic emboli (HCC); Cerebral thrombosis  with cerebral infarction (03/25/2016); CHF (congestive heart failure) (HCC); Convulsions/seizures (HCC) (05/04/2016); Dementia (04/27/2016); Depression; Dysphagia (05/04/2016); ETOH abuse; Hydrocephalus; Hyperlipidemia (05/04/2016); Hypernatremia; Hypothermia (03/20/2016); Low serum cortisol level (HCC) (05/04/2016); Meningitis, pneumococcal, recurrent; Meningitis, streptococcal (02/16/2016); Meningoencephalitis; NSTEMI (non-ST elevated myocardial infarction) (HCC) (03/14/2016); Protein-calorie malnutrition, severe (HCC); Respiratory failure (HCC) (03/03/2016); Septic shock (HCC); Severe sepsis (HCC); Streptococcal meningitis (04/27/2016); Substance abuse; Systolic and diastolic CHF, chronic (HCC); Tobacco abuse (02/16/2016); Trichomonal urethritis in male (02/16/2016); and Urinary retention (05/04/2016).  PAST SURGICAL HISTORY: He  has a past surgical history that includes Ankle surgery; Cardiac catheterization (N/A, 02/17/2016); TEE without cardioversion (N/A, 03/29/2016); and ir generic historical (08/01/2016).  No Known Allergies  No current facility-administered medications on file prior to encounter.    Current Outpatient Prescriptions on File Prior to Encounter  Medication Sig  . albuterol (PROVENTIL) (2.5 MG/3ML) 0.083% nebulizer solution Take 3 mLs (2.5 mg total) by nebulization every 2 (two) hours as needed for wheezing.  Marland Kitchen aspirin 325 MG tablet Place 1 tablet (325 mg total) into feeding tube daily.  Marland Kitchen atorvastatin (LIPITOR) 20 MG tablet Place 1 tablet (20 mg total) into feeding tube daily at 6 PM.  . famotidine (PEPCID) 40 MG/5ML suspension Place 2.5 mLs (20 mg total) into feeding tube 2 (two) times daily.  . fludrocortisone (FLORINEF) 0.1 MG tablet Place 1 tablet (0.1 mg total) into feeding tube daily.  . folic acid (FOLVITE) 1 MG tablet Place 1 tablet (1 mg total) into feeding tube daily.  . heparin 5000 UNIT/ML injection Inject 1 mL (5,000 Units total) into the skin every 8 (eight) hours.  .  hydrocortisone (CORTEF) 20 MG tablet Place 5 tablets (  100 mg total) into feeding tube 3 (three) times daily.  Marland Kitchen levETIRAcetam (KEPPRA) 100 MG/ML solution Place 15 mLs (1,500 mg total) into feeding tube 2 (two) times daily.  . QUEtiapine (SEROQUEL) 50 MG tablet Place 1 tablet (50 mg total) into feeding tube 2 (two) times daily. (Patient taking differently: Place 25 mg into feeding tube 2 (two) times daily. )  . thiamine 100 MG tablet Place 1 tablet (100 mg total) into feeding tube daily.  . Valproate Sodium (DEPAKENE) 250 MG/5ML SOLN solution Place 15 mLs (750 mg total) into feeding tube every 8 (eight) hours.  . Water For Irrigation, Sterile (FREE WATER) SOLN Place 200 mLs into feeding tube every 8 (eight) hours.  . haloperidol lactate (HALDOL) 5 MG/ML injection Inject 0.2-0.4 mLs (1-2 mg total) into the vein every 4 (four) hours as needed. (Patient not taking: Reported on November 05, 2016)  . Nutritional Supplements (FEEDING SUPPLEMENT, VITAL AF 1.2 CAL,) LIQD Place 1,000 mLs into feeding tube continuous. (Patient not taking: Reported on 2016/11/05)  . vancomycin (VANCOCIN) 50 mg/mL oral solution Place 10 mLs (500 mg total) into feeding tube every 6 (six) hours. (Patient not taking: Reported on 11/05/2016)  . Vancomycin (VANCOCIN) 750-5 MG/150ML-% SOLN Inject 150 mLs (750 mg total) into the vein every 12 (twelve) hours. (Patient not taking: Reported on 2016-11-05)    FAMILY HISTORY:  His has no family status information on file.    SOCIAL HISTORY: He  reports that he has been smoking Cigarettes.  He has been smoking about 0.10 packs per day. He has never used smokeless tobacco. He reports that he uses drugs, including Marijuana. He reports that he does not drink alcohol.  VITAL SIGNS: BP (!) 86/69   Pulse 100   Temp 97.2 F (36.2 C)   Resp 20   Ht 5\' 8"  (1.727 m)   Wt 142 lb (64.4 kg)   SpO2 100%   BMI 21.59 kg/m   HEMODYNAMICS:    VENTILATOR SETTINGS: Vent Mode: PRVC FiO2 (%):  [100  %] 100 % Set Rate:  [20 bmp-22 bmp] 20 bmp Vt Set:  [420 mL] 420 mL PEEP:  [10 cmH20] 10 cmH20 Plateau Pressure:  [20 cmH20-23 cmH20] 23 cmH20  INTAKE / OUTPUT: I/O last 3 completed shifts: In: 2150 [IV Piggyback:2150] Out: -   PHYSICAL EXAMINATION: General: Middle aged male in NAD. Neuro: Non-responsive, no CN reflexes HEENT: [place]/AT. PERRL, sclerae anicteric. Cardiovascular: RRR, no M/R/G.  Lungs: Respirations even and unlabored.  CTA bilaterally, No W/R/R. Abdomen: BS x 4, soft, NT/ND.  Musculoskeletal: No gross deformities, no edema.  Skin: Intact, warm, no rashes.  LABS:  BMET  Recent Labs Lab Nov 05, 2016 1544 November 05, 2016 1555  NA 145 147*  K 2.6* 2.1*  CL 98* 98*  CO2 33*  --   BUN 32* 33*  CREATININE 2.72* 2.70*  GLUCOSE 87 82    Electrolytes  Recent Labs Lab 2016-11-05 1544  CALCIUM 8.2*  MG 1.9    CBC  Recent Labs Lab 11/05/16 1544 2016/11/05 1555  WBC 3.4*  --   HGB 11.9* 11.9*  HCT 37.2* 35.0*  PLT 218  --     Coag's No results for input(s): APTT, INR in the last 168 hours.  Sepsis Markers  Recent Labs Lab November 05, 2016 1555 11-05-16 1841  LATICACIDVEN 6.18* 5.38*    ABG  Recent Labs Lab 11/05/2016 1650 11/05/2016 1940  PHART 7.378 7.466*  PCO2ART 51.5* 40.0  PO2ART 63.6* 67.9*    Liver Enzymes  Recent Labs  Lab 05/08/2016 1544  AST 75*  ALT 43  ALKPHOS 51  BILITOT 0.8  ALBUMIN 2.0*    Cardiac Enzymes No results for input(s): TROPONINI, PROBNP in the last 168 hours.  Glucose  Recent Labs Lab 05/08/2016 1554  GLUCAP 86    Imaging Dg Chest Portable 1 View  Result Date: 02-Nov-2016 CLINICAL DATA:  Status post intubation. EXAM: PORTABLE CHEST 1 VIEW COMPARISON:  One-view chest x-ray from the same day at 4:10 p.m. FINDINGS: The patient is an avid intubated. The endotracheal tube terminates 3.4 cm above the carina, in satisfactory position. Left upper lobe pneumonia is again seen. Extensive asymmetric edema is present on the left  as well. Bibasilar airspace disease likely reflects atelectasis. Basilar disease is likely exaggerated by reversed lordotic positioning. IMPRESSION: 1. Satisfactory positioning of the endotracheal tube 3.5 cm above the carina. 2. Persistent left lobe pneumonia. 3. Asymmetric left-sided edema. 4. Bibasilar airspace disease likely reflects atelectasis. Electronically Signed   By: Marin Robertshristopher  Mattern M.D.   On: 02-Nov-2016 18:19   Dg Chest Port 1 View  Result Date: 02-Nov-2016 CLINICAL DATA:  Code sepsis, nausea, vomiting, diarrhea, fever. EXAM: PORTABLE CHEST 1 VIEW COMPARISON:  08/10/2016. FINDINGS: Trachea is midline. Heart size is accentuated by portable AP supine technique. There is airspace consolidation in the left upper lobe. Minimal consolidation in the medial aspect of the lower right hemi thorax. No pleural fluid. IMPRESSION: 1. Left upper lobe pneumonia. Followup PA and lateral chest X-ray is recommended in 3-4 weeks following trial of antibiotic therapy to ensure resolution and exclude underlying malignancy. 2. Atelectasis or pneumonia at the medial right lung base. Electronically Signed   By: Leanna BattlesMelinda  Blietz M.D.   On: 02-Nov-2016 16:37     STUDIES:  CXR 11/26 >> LUL PNA  CULTURES: Blood 11/26 >> in process C. Diff 11/26 >> awaiting collection  ANTIBIOTICS: Vanc 11/26 >> Pip-tazo 11/26 >>  SIGNIFICANT EVENTS: Intubated (endotracheally) at [facility] ED 11/26 >>  LINES/TUBES: PEG PIVs Foley  DISCUSSION: 50 y/o man s/p devastating meningitis p/w fever, AMS  ASSESSMENT / PLAN:  PULMONARY A: Need for mechanical ventilation Hx of trach x2 P:   Full vent support for now Consider early, permanent trach placement.  CARDIOVASCULAR A:  Chronic CHF Hx of autonomic dysreflexia P:  MAP goal >55 Stress-dose hydocort  RENAL A:   Hypokalemia Hypernatremia Acute kidney injury P:   Replete K Due to dehydration Bolus IVF, increase free water in PEG  GASTROINTESTINAL A:    Need for TF Hypoalbuminemia P:   Resume home TF regimen  HEMATOLOGIC A:   Leukopenia Anemia P:  Likely 2/2 sepsis  INFECTIOUS A:   Severe Sepsis, likely pulmonary origin May also have C.diff or viral gastroenteritis Hx of MRSA bactermia Hx of HCAP with Enterobacter >> MDR. P:   Tx w/ Pip-tazo + Vanc for now Obtain sample (if meets criteria) for C. Diff PCR  ENDOCRINE A:   Adrenal insufficency P:   Stress dose steroids  NEUROLOGIC A:   Devastating CNS injury in March '17 Acute encephalopathy. Hx seizures, strep pneumo meningitis, CVA. P:   Continue depakote, keppra  Decreased Seroquel to 100 mg BID 8/20 Daily WUA. Prn meds  FAMILY  - Updates: Needs updated  - Inter-disciplinary family meet or Palliative Care meeting due by: 12/3 - have been frequent   CRITICAL CARE Performed by: Jamie KatoRIMBLE, Zareen Jamison   Total critical care time: 85 minutes  Critical care time was exclusive of separately billable procedures and treating other  patients.  Critical care was necessary to treat or prevent imminent or life-threatening deterioration.  Critical care was time spent personally by me on the following activities: development of treatment plan with patient and/or surrogate as well as nursing, discussions with consultants, evaluation of patient's response to treatment, examination of patient, obtaining history from patient or surrogate, ordering and performing treatments and interventions, ordering and review of laboratory studies, ordering and review of radiographic studies, pulse oximetry and re-evaluation of patient's condition.  Jamie KatoAaron Earnstine Meinders, MD Pulmonary and Critical Care Medicine [facility] Pager: [phone]  10/09/2016, 10:20 PM

## 2016-10-30 NOTE — Progress Notes (Signed)
Pharmacy Antibiotic Note  Dale [name] is a 50 y.o. male admitted on 07/31/16 with sepsis.  Pharmacy has been consulted for Vancomycin & Zosyn dosing. Zosyn 3.375 GM IV and Vancomycin 1 GM IV given in ED  Plan: Vancomycin 750 mg IV every 24 hours.  Goal trough 15-20 mcg/mL. Zosyn 3.375g IV q8h (4 hour infusion).  Height: 5\' 8"  (172.7 cm) Weight: 142 lb (64.4 kg) IBW/kg (Calculated) : 68.4  Temp (24hrs), Avg:100.9 F (38.3 C), Min:100.9 F (38.3 C), Max:100.9 F (38.3 C)   Recent Labs Lab 02-10-16 1544 02-10-16 1555  WBC 3.4*  --   CREATININE  --  2.70*    Estimated Creatinine Clearance: 29.2 mL/min (by C-G formula based on SCr of 2.7 mg/dL (H)).    No Known Allergies  Antimicrobials this admission: Zosyn 11/26 >>  Vancomycin 11/26 >>    Thank you for allowing pharmacy to be a part of this patient's care.  Dale [name], Dale [name] 07/31/16 4:25 PM

## 2016-10-30 NOTE — ED Notes (Signed)
Pt has completed 3000 mL saline at this time.

## 2016-10-30 NOTE — ED Notes (Signed)
RT at bedside. Intubating patient at this time.

## 2016-10-30 NOTE — ED Notes (Signed)
Attempted to call report was advised that Dale [name] would call back for report.

## 2016-10-31 ENCOUNTER — Inpatient Hospital Stay (HOSPITAL_COMMUNITY): Payer: Medicaid Other

## 2016-10-31 DIAGNOSIS — J9601 Acute respiratory failure with hypoxia: Secondary | ICD-10-CM

## 2016-10-31 DIAGNOSIS — J189 Pneumonia, unspecified organism: Secondary | ICD-10-CM

## 2016-10-31 DIAGNOSIS — N179 Acute kidney failure, unspecified: Secondary | ICD-10-CM

## 2016-10-31 LAB — BLOOD CULTURE ID PANEL (REFLEXED)
Acinetobacter baumannii: NOT DETECTED
CANDIDA GLABRATA: NOT DETECTED
CANDIDA KRUSEI: NOT DETECTED
CANDIDA PARAPSILOSIS: NOT DETECTED
Candida albicans: NOT DETECTED
Candida tropicalis: NOT DETECTED
ENTEROBACTER CLOACAE COMPLEX: NOT DETECTED
ENTEROCOCCUS SPECIES: NOT DETECTED
ESCHERICHIA COLI: NOT DETECTED
Enterobacteriaceae species: NOT DETECTED
Haemophilus influenzae: NOT DETECTED
KLEBSIELLA OXYTOCA: NOT DETECTED
Klebsiella pneumoniae: NOT DETECTED
LISTERIA MONOCYTOGENES: NOT DETECTED
Neisseria meningitidis: NOT DETECTED
PROTEUS SPECIES: NOT DETECTED
Pseudomonas aeruginosa: NOT DETECTED
SERRATIA MARCESCENS: NOT DETECTED
STAPHYLOCOCCUS AUREUS BCID: NOT DETECTED
STREPTOCOCCUS PYOGENES: NOT DETECTED
Staphylococcus species: NOT DETECTED
Streptococcus agalactiae: NOT DETECTED
Streptococcus pneumoniae: NOT DETECTED
Streptococcus species: NOT DETECTED

## 2016-10-31 LAB — BASIC METABOLIC PANEL
ANION GAP: 11 (ref 5–15)
Anion gap: 13 (ref 5–15)
BUN: 25 mg/dL — AB (ref 6–20)
BUN: 29 mg/dL — AB (ref 6–20)
CALCIUM: 7.3 mg/dL — AB (ref 8.9–10.3)
CHLORIDE: 108 mmol/L (ref 101–111)
CO2: 26 mmol/L (ref 22–32)
CO2: 29 mmol/L (ref 22–32)
CREATININE: 2.01 mg/dL — AB (ref 0.61–1.24)
Calcium: 6.8 mg/dL — ABNORMAL LOW (ref 8.9–10.3)
Chloride: 105 mmol/L (ref 101–111)
Creatinine, Ser: 1.18 mg/dL (ref 0.61–1.24)
GFR calc Af Amer: >60 mL/min (ref >60)
GFR calc non Af Amer: >60 mL/min (ref >60)
GFR, EST AFRICAN AMERICAN: 42 mL/min — AB (ref >60)
GFR, EST NON AFRICAN AMERICAN: 36 mL/min — AB (ref >60)
GLUCOSE: 131 mg/dL — AB (ref 65–99)
Glucose, Bld: 80 mg/dL (ref 65–99)
POTASSIUM: 2.4 mmol/L — AB (ref 3.5–5.1)
Potassium: 2.5 mmol/L — CL (ref 3.5–5.1)
SODIUM: 150 mmol/L — AB (ref 135–145)
Sodium: 142 mmol/L (ref 135–145)

## 2016-10-31 LAB — TYPE AND SCREEN
ABO/RH(D): B POS
Antibody Screen: NEGATIVE

## 2016-10-31 LAB — MAGNESIUM: MAGNESIUM: 1.8 mg/dL (ref 1.7–2.4)

## 2016-10-31 LAB — BLOOD GAS, ARTERIAL
Acid-Base Excess: 4.5 mmol/L — ABNORMAL HIGH (ref 0.0–2.0)
Bicarbonate: 28.9 mmol/L — ABNORMAL HIGH (ref 20.0–28.0)
Drawn by: 362771
FIO2: 1
MECHVT: 420 mL
O2 SAT: 95.6 %
PATIENT TEMPERATURE: 98.6
PCO2 ART: 45.8 mmHg (ref 32.0–48.0)
PEEP/CPAP: 10 cmH2O
PH ART: 7.416 (ref 7.350–7.450)
RATE: 20 resp/min
pO2, Arterial: 84.8 mmHg (ref 83.0–108.0)

## 2016-10-31 LAB — LACTIC ACID, PLASMA
LACTIC ACID, VENOUS: 4 mmol/L — AB (ref 0.5–1.9)
LACTIC ACID, VENOUS: 5.2 mmol/L — AB (ref 0.5–1.9)

## 2016-10-31 LAB — MRSA PCR SCREENING: MRSA by PCR: POSITIVE — AB

## 2016-10-31 LAB — GLUCOSE, CAPILLARY
Glucose-Capillary: 128 mg/dL — ABNORMAL HIGH (ref 65–99)
Glucose-Capillary: 138 mg/dL — ABNORMAL HIGH (ref 65–99)
Glucose-Capillary: 88 mg/dL (ref 65–99)
Glucose-Capillary: 97 mg/dL (ref 65–99)

## 2016-10-31 LAB — URINE CULTURE: Culture: <10000 — AB

## 2016-10-31 LAB — PHOSPHORUS: PHOSPHORUS: 6.3 mg/dL — AB (ref 2.5–4.6)

## 2016-10-31 MED ORDER — SODIUM CHLORIDE 0.9% FLUSH
10.0000 mL | INTRAVENOUS | Status: DC | PRN
  Administered 2016-11-17: 20 mL
  Administered 2016-11-17: 10 mL
  Administered 2016-11-22: 20 mL
  Administered 2016-11-27 – 2016-12-20 (×2): 10 mL
  Filled 2016-10-31 (×5): qty 40

## 2016-10-31 MED ORDER — POTASSIUM CHLORIDE 20 MEQ/15ML (10%) PO SOLN
40.0000 meq | ORAL | Status: AC
  Administered 2016-10-31 – 2016-11-01 (×4): 40 meq
  Filled 2016-10-31 (×4): qty 30

## 2016-10-31 MED ORDER — CHLORHEXIDINE GLUCONATE 0.12% ORAL RINSE (MEDLINE KIT)
15.0000 mL | Freq: Two times a day (BID) | OROMUCOSAL | Status: DC
  Administered 2016-10-31 – 2016-11-21 (×42): 15 mL via OROMUCOSAL

## 2016-10-31 MED ORDER — ORAL CARE MOUTH RINSE
15.0000 mL | OROMUCOSAL | Status: DC
  Administered 2016-10-31 – 2016-11-12 (×115): 15 mL via OROMUCOSAL

## 2016-10-31 MED ORDER — FREE WATER
300.0000 mL | Status: DC
  Administered 2016-10-31 – 2016-11-01 (×8): 300 mL

## 2016-10-31 MED ORDER — CHLORHEXIDINE GLUCONATE 0.12 % MT SOLN
OROMUCOSAL | Status: AC
  Administered 2016-10-31: 15 mL
  Filled 2016-10-31: qty 15

## 2016-10-31 MED ORDER — DEXTROSE 5 % IV SOLN
INTRAVENOUS | Status: DC
  Administered 2016-10-31 – 2016-11-01 (×2): via INTRAVENOUS

## 2016-10-31 NOTE — Progress Notes (Signed)
Peripherally Inserted Central Catheter/Midline Placement  The IV Nurse has discussed with the patient and/or persons authorized to consent for the patient, the purpose of this procedure and the potential benefits and risks involved with this procedure.  The benefits include less needle sticks, lab draws from the catheter, and the patient may be discharged home with the catheter. Risks include, but not limited to, infection, bleeding, blood clot (thrombus formation), and puncture of an artery; nerve damage and irregular heartbeat and possibility to perform a PICC exchange if needed/ordered by physician.  Alternatives to this procedure were also discussed.  Bard Power PICC patient education guide, fact sheet on infection prevention and patient information card has been provided to patient /or left at bedside.    PICC/Midline Placement Documentation  PICC Double Lumen 10/31/16 PICC Right Brachial 34 cm 0 cm (Active)  Indication for Insertion or Continuance of Line Poor Vasculature-patient has had multiple peripheral attempts or PIVs lasting less than 24 hours 10/31/2016  8:15 PM  Exposed Catheter (cm) 0 cm 10/31/2016  8:15 PM  Site Assessment Clean;Dry;Intact 10/31/2016  8:15 PM  Lumen #1 Status Blood return noted;Flushed;Saline locked 10/31/2016  8:15 PM  Lumen #2 Status Blood return noted;Flushed;Saline locked 10/31/2016  8:15 PM  Dressing Type Transparent 10/31/2016  8:15 PM  Dressing Status Clean;Dry;Intact;Antimicrobial disc in place 10/31/2016  8:15 PM  Dressing Intervention New dressing 10/31/2016  8:15 PM  Dressing Change Due 11/07/16 10/31/2016  8:15 PM       Netta Corriganhomas, Sherilee Smotherman L 10/31/2016, 8:34 PM

## 2016-10-31 NOTE — Progress Notes (Signed)
eLink Physician-Brief Progress Note Patient Name: Dale [name] DOB: 12/25/1965 MRN: [id number]   Date of Service  10/31/2016  HPI/Events of Note  K low.  eICU Interventions  Replace K.     Intervention Category Major Interventions: Other:  [name] 10/31/2016, 7:17 PM

## 2016-10-31 NOTE — Progress Notes (Signed)
PULMONARY / CRITICAL CARE MEDICINE   Name: Dale [name] MRN: [id number] DOB: 1966/08/22    ADMISSION DATE:  11/01/2016  CHIEF COMPLAINT:  Nausea and Vomiting  HISTORY OF PRESENT ILLNESS:   Dale [name] is non-verbal, and history is obtained from the chart. Brief history from Dr. Kendrick Fries 07/12/16: Dale [name] is an 50 y.o. M with PMH as outlined below including admission in March 2017 with strep pneumo meningitis c/b cardiac arrest and cardiogenic shock, ARDS requiring prolonged intubation and trach (now decannulated), and acute renal failure.  He was admitted to [facility] 06/16/16 through 07/11/16 for recurrent hypoglycemia due to adrenal insufficiency, new subacute CVA, seizures with inability to protect airway s/p intubation 7/13 through 7/21.  He re-presented 8/08 again with sepsis from UTI, and was intubated due to AMS, and was trached 8/18, and was discharged with a cuffless trach. That hospitalization was c/b MRSA bacteremia 8/24, and completed 3 weeks of IV vanc. He also developed C. Diff, and was treated with PO vanc through 9/11. He has had continued autonomic dysreflexia, which worsens during complications of acute illnesses.  Prior to this presentation, he had been well, although he was decannulated for reasons that are unclear. He was brought to the ED with decreased level of consciousness and nausea/vomiting/diarrhea. A CXR showed consolidation of the left upper lobe. He was intubated and transferred to [facility].  There have been significant amount of conversations with his wife with regard to goals of care, but she has insisted on full support. He is full code.  Subj - intubated, sedated Afebrile On versed + fent gtt    VITAL SIGNS: BP 95/72   Pulse (!) 102   Temp 98.6 F (37 C) (Oral)   Resp 20   Ht 5\' 8"  (1.727 m)   Wt 142 lb (64.4 kg)   SpO2 95%   BMI 21.59 kg/m   HEMODYNAMICS:    VENTILATOR SETTINGS: Vent Mode: PRVC FiO2 (%):  [70 %-100 %] 70 % Set Rate:  [20 bmp-22 bmp] 20  bmp Vt Set:  [420 mL] 420 mL PEEP:  [10 cmH20] 10 cmH20 Plateau Pressure:  [20 cmH20-23 cmH20] 23 cmH20  INTAKE / OUTPUT: I/O last 3 completed shifts: In: 5604.7 [I.V.:110.7; NG/GT:1029; IV Piggyback:4465] Out: 375 [Urine:375]  PHYSICAL EXAMINATION: General: Middle aged male , chr ill appearing Neuro: Non-responsive, sedated HEENT: [place]/AT. PERRL, sclerae anicteric. Cardiovascular: RRR, no M/R/G.  Lungs: Respirations even and unlabored.  CTA bilaterally, No W/R/R. Abdomen: BS x 4, soft, NT/ND.  Musculoskeletal: No gross deformities, no edema.  Skin: Intact, warm, no rashes.  LABS:  BMET  Recent Labs Lab 10/20/2016 1544 10/25/2016 1555 10/27/2016 2336  NA 145 147* 150*  K 2.6* 2.1* 2.5*  CL 98* 98* 108  CO2 33*  --  29  BUN 32* 33* 29*  CREATININE 2.72* 2.70* 2.01*  GLUCOSE 87 82 80    Electrolytes  Recent Labs Lab 11/01/2016 1544 10/24/2016 2336  CALCIUM 8.2* 7.3*  MG 1.9 1.8  PHOS  --  6.3*    CBC  Recent Labs Lab 10/28/2016 1544 10/18/2016 1555  WBC 3.4*  --   HGB 11.9* 11.9*  HCT 37.2* 35.0*  PLT 218  --     Coag's No results for input(s): APTT, INR in the last 168 hours.  Sepsis Markers  Recent Labs Lab 10/23/2016 1841 10/23/2016 2336 10/31/16 0336  LATICACIDVEN 5.38* 5.2* 4.0*    ABG  Recent Labs Lab 10/07/2016 1650 10/05/2016 1940 10/06/2016 2359  PHART 7.378  7.466* 7.416  PCO2ART 51.5* 40.0 45.8  PO2ART 63.6* 67.9* 84.8    Liver Enzymes  Recent Labs Lab 02/04/16 1544  AST 75*  ALT 43  ALKPHOS 51  BILITOT 0.8  ALBUMIN 2.0*    Cardiac Enzymes No results for input(s): TROPONINI, PROBNP in the last 168 hours.  Glucose  Recent Labs Lab 02/04/16 1554 10/31/16 0039  GLUCAP 86 88    Imaging Dg Chest Port 1 View  Result Date: September 26, 2016 CLINICAL DATA:  [facility]al [facility] EXAM: PORTABLE CHEST 1 VIEW COMPARISON:  September 26, 2016 FINDINGS: Endotracheal tube with tip measuring 5.8 cm above the  carina. Shallow inspiration. Right hemidiaphragm is obscured suggesting small effusion or infiltration. Focal consolidation in the left mid lung likely representing pneumonia. No pneumothorax. Normal heart size and pulmonary vasculature. IMPRESSION: Endotracheal tube appears in satisfactory position. Infiltration or effusion in the right lung base obscuring the right hemidiaphragm. Focal consolidation in the left mid lung consistent with pneumonia. Electronically Signed   By: Burman NievesWilliam  Stevens M.D.   On: September 26, 2016 22:51   Dg Chest Portable 1 View  Result Date: September 26, 2016 CLINICAL DATA:  Status post intubation. EXAM: PORTABLE CHEST 1 VIEW COMPARISON:  One-view chest x-ray from the same day at 4:10 p.m. FINDINGS: The patient is an avid intubated. The endotracheal tube terminates 3.4 cm above the carina, in satisfactory position. Left upper lobe pneumonia is again seen. Extensive asymmetric edema is present on the left as well. Bibasilar airspace disease likely reflects atelectasis. Basilar disease is likely exaggerated by reversed lordotic positioning. IMPRESSION: 1. Satisfactory positioning of the endotracheal tube 3.5 cm above the carina. 2. Persistent left lobe pneumonia. 3. Asymmetric left-sided edema. 4. Bibasilar airspace disease likely reflects atelectasis. Electronically Signed   By: Marin Robertshristopher  Mattern M.D.   On: September 26, 2016 18:19   Dg Chest Port 1 View  Result Date: September 26, 2016 CLINICAL DATA:  Code sepsis, nausea, vomiting, diarrhea, fever. EXAM: PORTABLE CHEST 1 VIEW COMPARISON:  08/10/2016. FINDINGS: Trachea is midline. Heart size is accentuated by portable AP supine technique. There is airspace consolidation in the left upper lobe. Minimal consolidation in the medial aspect of the lower right hemi thorax. No pleural fluid. IMPRESSION: 1. Left upper lobe pneumonia. Followup PA and lateral chest X-ray is recommended in 3-4 weeks following trial of antibiotic therapy to ensure resolution and exclude  underlying malignancy. 2. Atelectasis or pneumonia at the medial right lung base. Electronically Signed   By: Leanna BattlesMelinda  Blietz M.D.   On: September 26, 2016 16:37     STUDIES:  CXR 11/26 >> LUL PNA  CULTURES: Blood 11/26 >>  C. Diff 11/26 >>   ANTIBIOTICS: Vanc 11/26 >> Pip-tazo 11/26 >>  SIGNIFICANT EVENTS: Intubated (endotracheally) at [facility] ED 11/26 >>  LINES/TUBES: PEG PIVs Foley  DISCUSSION: 50 y/o man s/p devastating meningitis p/w fever, AMS & HCAP vs aspiration  ASSESSMENT / PLAN:  PULMONARY A: Acute resp failure/ ARDS Hx of trach x2 P:   Full vent support for now Drop FIO2 50%, drop PEEP if tolerates Consider early, permanent trach placement.  CARDIOVASCULAR A:  Chronic CHF -EF 40% 07/2016 Hx of autonomic dysreflexia P:  MAP goal >55 Stress-dose hydocort  RENAL A:   Hypokalemia Hypernatremia Acute kidney injury P:   Replete K ct free water in PEG  GASTROINTESTINAL A:   Need for TF Hypoalbuminemia P:   Resume home TF  XR abd  HEMATOLOGIC A:   Leukopenia Anemia P:  Likely 2/2 sepsis  INFECTIOUS A:  Severe Sepsis, likely pulmonary origin May also have C.diff or viral gastroenteritis Hx of MRSA bactermia Hx of HCAP with Enterobacter >> MDR. P:   Tx w/ Pip-tazo + Vanc for now  ENDOCRINE A:   Adrenal insufficency - on 100 mg hydrocort daily P:   Stress dose steroids  NEUROLOGIC A:   Devastating CNS injury in March '17 Acute encephalopathy. Hx seizures, strep pneumo meningitis, CVA. P:   Continue depakote, keppra  Ct  Seroquel  Daily WUA -would like to dc versed Prn meds  FAMILY  - Updates: Needs updated  - Inter-disciplinary family meet or Palliative Care meeting due by: 12/3  The patient is critically ill with multiple organ systems failure and requires high complexity decision making for assessment and support, frequent evaluation and titration of therapies, application of advanced monitoring technologies and extensive  interpretation of multiple databases. Critical Care Time devoted to patient care services described in this note independent of APP time is 35 minutes.    Cyril Mourningakesh Maritsa Hunsucker MD. Tonny BollmanFCCP. [place] [facility] Pager [phone] If no response call [phone]      10/31/2016, 9:48 AM

## 2016-10-31 NOTE — Progress Notes (Signed)
Initial Nutrition Assessment  DOCUMENTATION CODES:   Severe malnutrition in context of chronic illness  INTERVENTION:   Continue: Vital AF 1.2 @ 65 ml/hr (1560 ml) Provides: 1872 kcal, 117 grams protein, and 1265 ml H2O.  Total free water: 2865 ml   NUTRITION DIAGNOSIS:   Malnutrition (Severe) related to chronic illness as evidenced by severe depletion of body fat, severe depletion of muscle mass.  GOAL:   Patient will meet greater than or equal to 90% of their needs  MONITOR:   TF tolerance, I & O's, Vent status, Labs  REASON FOR ASSESSMENT:   Ventilator (Tube feeding)    ASSESSMENT:   Pt with strep pneumo meningitis c/b cardiac arrest and cardiogenic shock, ARDS requiring prolonged intubation and trach (now decannulated), and acute renal failure.  [facility] was [facility]16[facility]16 through 07/11/16 for recurrent hypoglycemia due to adrenal insufficiency, new subacute CVA, seizures with inability to protect airway s/p intubation 7/13 through 7/21.  He re-presented 8/08 again with sepsis from UTI, and was intubated due to AMS, and was trached 8/18, and was discharged with a cuffless trach. That hospitalization was c/b MRSA bacteremia 8/24, and completed 3 weeks of IV vanc. He also developed C. Diff, and was treated with PO vanc through 9/11. He has had continued autonomic dysreflexia, which worsens during complications of acute illnesses. Admitted with N/V/D.   Weight increased from last admission, noted edema No family present. Has PEG from last admission.   Patient is currently intubated on ventilator support MV: 10 L/min Temp (24hrs), Avg:97.6 F (36.4 C), Min:96.6 F (35.9 C), Max:99.9 F (37.7 C)  Medications reviewed and include: folvite, solu-cortef, thiamine Free water: 300 ml every 4 hours = 1800 ml  Labs reviewed: K+ 2.5, PO4: 6.3 Nutrition-Focused physical exam completed. Findings are mild/moderate-severe fat depletion, mild/moderate-severe muscle depletion, and  moderate edema.  Abdomen firm   Diet Order:  Diet NPO time specified  Skin:  Wound (see comment) (unstageable right heel)  Last BM:  11/26  Height:   Ht Readings from Last 1 Encounters:  10/31/16 5\' 8"  (1.727 m)    Weight:   Wt Readings from Last 1 Encounters:  10/31/16 146 lb 13.2 oz (66.6 kg)    Ideal Body Weight:  70 kg  BMI:  Body mass index is 22.32 kg/m.  Estimated Nutritional Needs:   Kcal:  1806  Protein:  100-120 grams  Fluid:  >1.8 L/day  EDUCATION NEEDS:   No education needs identified at this time  Kendell BaneHeather Axyl Sitzman RD, LDN, CNSC [phone] Pager [phone] After Hours Pager

## 2016-10-31 NOTE — Progress Notes (Signed)
CRITICAL VALUE ALERT  Critical value received: Potassium 2.4 Date of notification:  10/31/2016  Time of notification:  1855  Critical value read back:Yes.    Nurse who received alert:  Deneise LeverPatricia Macil Crady RN  MD notified (1st page):  Rocky MorelMarilyn, e-link RN  Time of first page:  1855  MD notified (2nd page):  Time of second page:  Responding MD: Rocky MorelMarilyn, e-link RN Time MD responded:  [phone]

## 2016-11-01 ENCOUNTER — Inpatient Hospital Stay (HOSPITAL_COMMUNITY): Payer: Medicaid Other

## 2016-11-01 LAB — CBC
HCT: 27.8 % — ABNORMAL LOW (ref 39.0–52.0)
Hemoglobin: 8.9 g/dL — ABNORMAL LOW (ref 13.0–17.0)
MCH: 29.8 pg (ref 26.0–34.0)
MCHC: 32 g/dL (ref 30.0–36.0)
MCV: 93 fL (ref 78.0–100.0)
PLATELETS: 122 10*3/uL — AB (ref 150–400)
RBC: 2.99 MIL/uL — ABNORMAL LOW (ref 4.22–5.81)
RDW: 17.6 % — AB (ref 11.5–15.5)
WBC: 6.6 10*3/uL (ref 4.0–10.5)

## 2016-11-01 LAB — BASIC METABOLIC PANEL
Anion gap: 10 (ref 5–15)
BUN: 20 mg/dL (ref 6–20)
CALCIUM: 7.2 mg/dL — AB (ref 8.9–10.3)
CHLORIDE: 106 mmol/L (ref 101–111)
CO2: 28 mmol/L (ref 22–32)
CREATININE: 0.92 mg/dL (ref 0.61–1.24)
GFR calc Af Amer: >60 mL/min (ref >60)
GFR calc non Af Amer: >60 mL/min (ref >60)
Glucose, Bld: 119 mg/dL — ABNORMAL HIGH (ref 65–99)
Potassium: 3.1 mmol/L — ABNORMAL LOW (ref 3.5–5.1)
Sodium: 144 mmol/L (ref 135–145)

## 2016-11-01 LAB — GLUCOSE, CAPILLARY: Glucose-Capillary: 109 mg/dL — ABNORMAL HIGH (ref 65–99)

## 2016-11-01 LAB — PHOSPHORUS: Phosphorus: 1.8 mg/dL — ABNORMAL LOW (ref 2.5–4.6)

## 2016-11-01 LAB — MAGNESIUM: MAGNESIUM: 2 mg/dL (ref 1.7–2.4)

## 2016-11-01 MED ORDER — VANCOMYCIN HCL IN DEXTROSE 1-5 GM/200ML-% IV SOLN
1000.0000 mg | Freq: Two times a day (BID) | INTRAVENOUS | Status: DC
  Administered 2016-11-01 – 2016-11-02 (×4): 1000 mg via INTRAVENOUS
  Filled 2016-11-01 (×6): qty 200

## 2016-11-01 MED ORDER — METOPROLOL TARTRATE 5 MG/5ML IV SOLN
5.0000 mg | INTRAVENOUS | Status: DC | PRN
  Administered 2016-11-01 – 2016-11-13 (×5): 5 mg via INTRAVENOUS
  Filled 2016-11-01 (×4): qty 5

## 2016-11-01 MED ORDER — FREE WATER
300.0000 mL | Freq: Four times a day (QID) | Status: DC
  Administered 2016-11-01 – 2016-11-08 (×26): 300 mL

## 2016-11-01 MED ORDER — SODIUM CHLORIDE 0.9 % IV BOLUS (SEPSIS): 500.0000 mL | Freq: Once | INTRAVENOUS | Status: AC

## 2016-11-01 MED ORDER — DILTIAZEM HCL-DEXTROSE 100-5 MG/100ML-% IV SOLN (PREMIX)
5.0000 mg/h | INTRAVENOUS | Status: DC
  Administered 2016-11-01 (×2): 10 mg/h via INTRAVENOUS
  Administered 2016-11-02: 5 mg/h via INTRAVENOUS
  Filled 2016-11-01 (×3): qty 100

## 2016-11-01 MED ORDER — DILTIAZEM LOAD VIA INFUSION
10.0000 mg | Freq: Once | INTRAVENOUS | Status: AC
  Administered 2016-11-01: 10 mg via INTRAVENOUS
  Filled 2016-11-01: qty 10

## 2016-11-01 MED ORDER — MUPIROCIN 2 % EX OINT
1.0000 "application " | TOPICAL_OINTMENT | Freq: Two times a day (BID) | CUTANEOUS | Status: AC
  Administered 2016-11-01 – 2016-11-05 (×10): 1 via NASAL
  Filled 2016-11-01: qty 22

## 2016-11-01 MED ORDER — SODIUM CHLORIDE 0.9 % IV BOLUS (SEPSIS): 500.0000 mL | Freq: Once | INTRAVENOUS | Status: DC

## 2016-11-01 MED ORDER — PHENYLEPHRINE HCL 10 MG/ML IJ SOLN
0.0000 ug/min | INTRAMUSCULAR | Status: DC
  Administered 2016-11-01: 10 ug/min via INTRAVENOUS
  Administered 2016-11-02: 15 ug/min via INTRAVENOUS
  Filled 2016-11-01 (×2): qty 1

## 2016-11-01 MED ORDER — POTASSIUM PHOSPHATES 15 MMOLE/5ML IV SOLN
20.0000 meq | Freq: Once | INTRAVENOUS | Status: AC
  Administered 2016-11-01: 20 meq via INTRAVENOUS
  Filled 2016-11-01: qty 4.55

## 2016-11-01 MED ORDER — FENTANYL 2500MCG IN NS 250ML (10MCG/ML) PREMIX INFUSION
25.0000 ug/h | INTRAVENOUS | Status: DC
  Administered 2016-11-01: 125 ug/h via INTRAVENOUS
  Administered 2016-11-02: 150 ug/h via INTRAVENOUS
  Administered 2016-11-03: 25 ug/h via INTRAVENOUS
  Administered 2016-11-04 – 2016-11-08 (×5): 100 ug/h via INTRAVENOUS
  Administered 2016-11-08: 125 ug/h via INTRAVENOUS
  Filled 2016-11-01 (×10): qty 250

## 2016-11-01 MED ORDER — CHLORHEXIDINE GLUCONATE CLOTH 2 % EX PADS
6.0000 | MEDICATED_PAD | Freq: Every day | CUTANEOUS | Status: AC
  Administered 2016-11-01 – 2016-11-05 (×5): 6 via TOPICAL

## 2016-11-01 NOTE — Progress Notes (Signed)
Pharmacy Antibiotic Note  Bonnita HollowCraig D Bomkamp is a 50 y.o. male admitted on November 29, 2016 with sepsis.  Pharmacy has been consulted for Vancomycin & Zosyn dosing.  Patient afebrile, wbc normal. Scr has normalized. 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    Plan: Increase vancomycin to 1g q12 with improvement in renal function.  Goal trough 15-20  Zosyn 3.375g IV q8h (4 hour infusion).  Height: 5\' 8"  (172.7 cm) Weight: 155 lb 3.3 oz (70.4 kg) IBW/kg (Calculated) : 68.4  Temp (24hrs), Avg:98.1 F (36.7 C), Min:97 F (36.1 C), Max:99.1 F (37.3 C)   Recent Labs Lab 08-02-16 1544 08-02-16 1555 08-02-16 1841 08-02-16 2336 10/31/16 0336 10/31/16 1759 11/01/16 0238  WBC 3.4*  --   --   --   --   --  6.6  CREATININE 2.72* 2.70*  --  2.01*  --  1.18 0.92  LATICACIDVEN  --  6.18* 5.38* 5.2* 4.0*  --   --     Estimated Creatinine Clearance: 90.9 mL/min (by C-G formula based on SCr of 0.92 mg/dL).    No Known Allergies  Antimicrobials this admission: Zosyn 11/26 >>  Vancomycin 11/26 >>   Microbiology results:  11/26 BCx: 1/2 GPC - no bug ID 11/27 UCx: ng 11/27 TA: few GNR 11/27 MRSA PCR: positive  Dose adjustments this admission:  11/28 - inc vanc to 1g q12  Thank you for allowing pharmacy to be a part of this patient's care.  Sheppard CoilFrank Wilson PharmD., BCPS Clinical Pharmacist Pager [phone] 11/01/2016 8:23 AM

## 2016-11-01 NOTE — Progress Notes (Signed)
PULMONARY / CRITICAL CARE MEDICINE   Name: Dale [name] MRN: [id number] DOB: 06/25/1966    ADMISSION DATE:  10/17/2016  CHIEF COMPLAINT:  Nausea and Vomiting  HISTORY OF PRESENT ILLNESS:   [name] is non-verbal, and history is obtained from the chart. Brief history from Dr. Kendrick FriesMcQuaid 07/12/16: Dale [name] is an 50 y.o. M with PMH as outlined below including admission in March 2017 with strep pneumo meningitis c/b cardiac arrest and cardiogenic shock, ARDS requiring prolonged intubation and trach (now decannulated), and acute renal failure.  [facility] through 07/11/16 for recurrent hypoglycemia due to adrenal insufficiency, new subacute CVA, seizures with inability to protect airway s/p intubation 7/13 through 7/21.  He re-presented 8/08 again with sepsis from UTI, and was intubated due to AMS, and was trached 8/18, and was discharged with a cuffless trach. That hospitalization was c/b MRSA bacteremia 8/24, and completed 3 weeks of IV vanc. He also developed C. Diff, and was treated with PO vanc through 9/11. He has had continued autonomic dysreflexia, which worsens during complications of acute illnesses.  Prior to this presentation, he had been well, although he was decannulated for reasons that are unclear. He was brought to the ED with decreased level of consciousness and nausea/vomiting/diarrhea. A CXR showed consolidation of the left upper lobe. He was intubated and [facility].  There have been significant amount of conversations with his wife with regard to goals of care, but she has insisted on full support. He is full code.  Subj - intubated, sedated Afebrile Tachycardic this am, sustained after being cleaned, turned    VITAL SIGNS: BP 100/74   Pulse (!) 103   Temp 99.3 F (37.4 C) (Core (Comment))   Resp 11   Ht 5\' 8"  (1.727 m)   Wt 155 lb 3.3 oz (70.4 kg)   SpO2 93%   BMI 23.60 kg/m   HEMODYNAMICS:    VENTILATOR SETTINGS: Vent Mode:  PSV;CPAP FiO2 (%):  [40 %-50 %] 40 % Set Rate:  [20 bmp] 20 bmp Vt Set:  [420 mL] 420 mL PEEP:  [8 cmH20-10 cmH20] 8 cmH20 Pressure Support:  [5 cmH20] 5 cmH20 Plateau Pressure:  [15 cmH20-19 cmH20] 19 cmH20  INTAKE / OUTPUT: I/O last 3 completed shifts: In: 7495 [I.V.:1691; NW/GN:5621G/GT:3189; IV Piggyback:2615] Out: 2670 [Urine:2670]  PHYSICAL EXAMINATION: General: Middle aged male , chr ill appearing Neuro: Non-responsive, sedated HEENT: [place]/AT. PERRL, sclerae anicteric. Cardiovascular: RRR, no M/R/G.  Lungs: Respirations even and unlabored.  CTA bilaterally, No W/R/R. Abdomen: BS x 4, soft, NT/ND.  Musculoskeletal: No gross deformities, no edema.  Skin: Intact, warm, no rashes.  LABS:  BMET  Recent Labs Lab 10/31/2016 2336 10/31/16 1759 11/01/16 0238  NA 150* 142 144  K 2.5* 2.4* 3.1*  CL 108 105 106  CO2 29 26 28   BUN 29* 25* 20  CREATININE 2.01* 1.18 0.92  GLUCOSE 80 131* 119*    Electrolytes  Recent Labs Lab 10/16/2016 1544 10/08/2016 2336 10/31/16 1759 11/01/16 0238  CALCIUM 8.2* 7.3* 6.8* 7.2*  MG 1.9 1.8  --  2.0  PHOS  --  6.3*  --  1.8*    CBC  Recent Labs Lab 10/31/2016 1544 10/16/2016 1555 11/01/16 0238  WBC 3.4*  --  6.6  HGB 11.9* 11.9* 8.9*  HCT 37.2* 35.0* 27.8*  PLT 218  --  122*    Coag's No results for input(s): APTT, INR in the last 168 hours.  Sepsis Markers  Recent Labs Lab 10/10/2016 1841 10/05/2016 2336 10/31/16 0336  LATICACIDVEN 5.38* 5.2* 4.0*    ABG  Recent Labs Lab 10/18/2016 1650 10/29/2016 1940 10/24/2016 2359  PHART 7.378 7.466* 7.416  PCO2ART 51.5* 40.0 45.8  PO2ART 63.6* 67.9* 84.8    Liver Enzymes  Recent Labs Lab 10/07/2016 1544  AST 75*  ALT 43  ALKPHOS 51  BILITOT 0.8  ALBUMIN 2.0*    Cardiac Enzymes No results for input(s): TROPONINI, PROBNP in the last 168 hours.  Glucose  Recent Labs Lab 10/05/2016 1554 10/31/16 0039 10/31/16 0441 10/31/16 0831 10/31/16 1231 10/31/16 1716  GLUCAP 86 88 109*  97 138* 128*    Imaging Dg Abd 1 View  Result Date: 10/31/2016 CLINICAL DATA:  Abdominal distention EXAM: ABDOMEN - 1 VIEW COMPARISON:  07/26/2016 FINDINGS: A pull-through gastrostomy tube has been placed. It projects over the gastric bubble. No disproportionate dilatation of bowel. There is no obvious free intraperitoneal gas. Hazy airspace disease is suspected in the left lower lung zone. IMPRESSION: Nonobstructive bowel gas pattern. Pull-through gastrostomy tube placement. Possible left lower lobe pneumonia. Correlation with chest radiographs are recommended. Electronically Signed   By: Jolaine ClickArthur  Hoss M.D.   On: 10/31/2016 12:06   Dg Chest Port 1 View  Result Date: 11/01/2016 CLINICAL DATA:  Hypoxemic respiratory failure with shortness of breath EXAM: PORTABLE CHEST 1 VIEW COMPARISON:  10/18/2016 FINDINGS: Endotracheal tube tip is approximately 2.9 cm superior to the carina. Right upper extremity catheter has been inserted and the tip projects over the mid SVC. Interim worsening of diffuse air space consolidation of the left hemi thorax. Slightly improved aeration of the right lung base. Cardiomediastinal silhouette is obscured. IMPRESSION: 1. Support lines and tubes as above. Right upper extremity catheter tip overlies the SVC. 2. Significant worsening of diffuse airspace disease in the left thorax. 3. Improved aeration of the right base Electronically Signed   By: Jasmine PangKim  Fujinaga M.D.   On: 11/01/2016 04:01     STUDIES:  CXR 11/26 >> LUL PNA  CULTURES: Blood 11/26 >> GPC >> C. Diff 11/26 >> resp 11/27 >> GNR >>   ANTIBIOTICS: Vanc 11/26 >> Pip-tazo 11/26 >>  SIGNIFICANT EVENTS: Intubated (endotracheally) at OSH ED 11/26 >>  LINES/TUBES: PEG PICC 11/27  RUE >> Foley  DISCUSSION: 50 y/o man s/p devastating meningitis p/w fever, AMS & HCAP vs aspiration  ASSESSMENT / PLAN:  PULMONARY A: Acute resp failure/ ARDS Hx of trach x2 P:   SBTs  Consider early, permanent trach  placement.  CARDIOVASCULAR A:  Chronic CHF -EF 40% 07/2016 Hx of autonomic dysreflexia SInus tach P:  MAP goal >55 Stress-dose hydocort Lopressor 5 q4 prn  RENAL A:   Hypokalemia Hypernatremia -resolved Acute kidney injury -resolved P:   Replete K ct free water in PEG Dc D5W  GASTROINTESTINAL A:   Need for TF Hypoalbuminemia P:   Resume  TF    HEMATOLOGIC A:   Leukopenia Anemia P:  Likely 2/2 sepsis  INFECTIOUS A:   Severe Sepsis, Lt HCAP May also have C.diff or viral gastroenteritis Hx of MRSA bactermia Hx of HCAP with Enterobacter >> MDR. P:   Tx w/ Pip-tazo + Vanc for now  ENDOCRINE A:   Adrenal insufficency - on 100 mg hydrocort daily P:   Stress dose steroids  NEUROLOGIC A:   Devastating CNS injury in March '17 Acute encephalopathy. Hx seizures, strep pneumo meningitis, CVA. P:   Continue depakote, keppra  Ct  Seroquel & clonazepam  Daily  WUA - dc versed Prn meds  FAMILY  - Updates: wife 11/28  - Inter-disciplinary family meet or Palliative Care meeting due by: 12/3, has been resistant to these conversations in the past  The patient is critically ill with multiple organ systems failure and requires high complexity decision making for assessment and support, frequent evaluation and titration of therapies, application of advanced monitoring technologies and extensive interpretation of multiple databases. Critical Care Time devoted to patient care services described in this note independent of APP time is 35 minutes.    Cyril Mourning MD. Tonny Bollman. [facility] Pager [phone] If no response call [phone]      11/01/2016, 10:11 AM

## 2016-11-02 DIAGNOSIS — J9811 Atelectasis: Secondary | ICD-10-CM

## 2016-11-02 DIAGNOSIS — R6521 Severe sepsis with septic shock: Secondary | ICD-10-CM

## 2016-11-02 DIAGNOSIS — A419 Sepsis, unspecified organism: Secondary | ICD-10-CM

## 2016-11-02 LAB — BASIC METABOLIC PANEL
ANION GAP: 8 (ref 5–15)
BUN: 21 mg/dL — ABNORMAL HIGH (ref 6–20)
CHLORIDE: 111 mmol/L (ref 101–111)
CO2: 28 mmol/L (ref 22–32)
CREATININE: 0.81 mg/dL (ref 0.61–1.24)
Calcium: 7.8 mg/dL — ABNORMAL LOW (ref 8.9–10.3)
GFR calc non Af Amer: >60 mL/min (ref >60)
Glucose, Bld: 90 mg/dL (ref 65–99)
Potassium: 3.8 mmol/L (ref 3.5–5.1)
Sodium: 147 mmol/L — ABNORMAL HIGH (ref 135–145)

## 2016-11-02 LAB — CULTURE, RESPIRATORY

## 2016-11-02 LAB — CULTURE, RESPIRATORY W GRAM STAIN

## 2016-11-02 MED ORDER — POTASSIUM CHLORIDE 20 MEQ/15ML (10%) PO SOLN
40.0000 meq | Freq: Once | ORAL | Status: AC
  Administered 2016-11-02: 40 meq
  Filled 2016-11-02: qty 30

## 2016-11-02 MED ORDER — FENTANYL BOLUS VIA INFUSION
20.0000 ug | INTRAVENOUS | Status: DC | PRN
  Administered 2016-11-02: 100 ug via INTRAVENOUS
  Administered 2016-11-03: 50 ug via INTRAVENOUS
  Administered 2016-11-06 – 2016-11-08 (×3): 100 ug via INTRAVENOUS
  Filled 2016-11-02: qty 100

## 2016-11-02 MED ORDER — MIDAZOLAM HCL 2 MG/2ML IJ SOLN
INTRAMUSCULAR | Status: AC
  Administered 2016-11-02: 2 mg
  Filled 2016-11-02: qty 2

## 2016-11-02 MED ORDER — DEXTROSE 5 % IV SOLN
1.0000 g | INTRAVENOUS | Status: DC
  Administered 2016-11-02 – 2016-11-03 (×2): 1 g via INTRAVENOUS
  Filled 2016-11-02 (×3): qty 10

## 2016-11-02 MED ORDER — MIDAZOLAM HCL 2 MG/2ML IJ SOLN: 2.0000 mg | Freq: Once | INTRAMUSCULAR | Status: DC

## 2016-11-02 NOTE — Progress Notes (Signed)
PULMONARY / CRITICAL CARE MEDICINE   Name: Dale [name] MRN: [id number] DOB: 08/10/1966    ADMISSION DATE:  10/19/2016  CHIEF COMPLAINT:  Nausea and Vomiting  HISTORY OF PRESENT ILLNESS:   Dale [name] is non-verbal, and history is obtained from the chart. Brief history from Dr. Kendrick FriesMcQuaid 07/12/16: Dale [name] is an 50 y.o. M with PMH as outlined below including admission in March 2017 with strep pneumo meningitis c/b cardiac arrest and cardiogenic shock, ARDS requiring prolonged intubation and trach (now decannulated), and acute renal failure.  [facility]16 through 07/11/16 for recurrent hypoglycemia due to adrenal insufficiency, new subacute CVA, seizures with inability to protect airway s/p intubation 7/13 through 7/21.  He re-presented 8/08 again with sepsis from UTI, and was intubated due to AMS, and was trached 8/18, and was discharged with a cuffless trach. That hospitalization was c/b MRSA bacteremia 8/24, and completed 3 weeks of IV vanc. He also developed C. Diff, and was treated with PO vanc through 9/11. He has had continued autonomic dysreflexia, which worsens during complications of acute illnesses.  Prior to this presentation, he had been well, although he was decannulated for reasons that are unclear. He was brought to the ED with decreased level of consciousness and nausea/vomiting/diarrhea. A CXR showed consolidation of the left upper lobe. [facility].  There have been significant amount of conversations with his wife with regard to goals of care, but she has insisted on full support. He is full code.  Subj - intubated, sedated Afebrile Back in nSR, off cardizem, on neo gtt  remains critically ill    VITAL SIGNS: BP 101/71 (BP Location: Left Arm)   Pulse 83   Temp 100 F (37.8 C) (Core (Comment))   Resp 18   Ht 5\' 8"  (1.727 m)   Wt 163 lb 5.8 oz (74.1 kg)   SpO2 96%   BMI 24.84 kg/m   HEMODYNAMICS:    VENTILATOR  SETTINGS: Vent Mode: PRVC FiO2 (%):  [40 %-100 %] 60 % Set Rate:  [20 bmp] 20 bmp Vt Set:  [420 mL] 420 mL PEEP:  [5 cmH20] 5 cmH20 Pressure Support:  [5 cmH20] 5 cmH20 Plateau Pressure:  [15 cmH20-25 cmH20] 19 cmH20  INTAKE / OUTPUT: I/O last 3 completed shifts: In: 4902.8 [I.V.:1437.8; Other:60; NG/GT:2605; IV Piggyback:800] Out: 5155 [Urine:5155]  PHYSICAL EXAMINATION: General: Middle aged male , chr ill appearing Neuro: Non-responsive, sedated HEENT: [place]/AT. PERRL, sclerae anicteric. Cardiovascular: RRR, no M/R/G.  Lungs: Respirations even and unlabored.  CTA bilaterally, No W/R/R. Abdomen: BS x 4, soft, NT/ND.  Musculoskeletal: No gross deformities, no edema.  Skin: Intact, warm, no rashes.  LABS:  BMET  Recent Labs Lab 10/28/2016 2336 10/31/16 1759 11/01/16 0238  NA 150* 142 144  K 2.5* 2.4* 3.1*  CL 108 105 106  CO2 29 26 28   BUN 29* 25* 20  CREATININE 2.01* 1.18 0.92  GLUCOSE 80 131* 119*    Electrolytes  Recent Labs Lab 10/19/2016 1544 10/09/2016 2336 10/31/16 1759 11/01/16 0238  CALCIUM 8.2* 7.3* 6.8* 7.2*  MG 1.9 1.8  --  2.0  PHOS  --  6.3*  --  1.8*    CBC  Recent Labs Lab 10/29/2016 1544 10/17/2016 1555 11/01/16 0238  WBC 3.4*  --  6.6  HGB 11.9* 11.9* 8.9*  HCT 37.2* 35.0* 27.8*  PLT 218  --  122*    Coag's No results for input(s): APTT, INR in the  last 168 hours.  Sepsis Markers  Recent Labs Lab 10/06/2016 1841 10/08/2016 2336 10/31/16 0336  LATICACIDVEN 5.38* 5.2* 4.0*    ABG  Recent Labs Lab 10/21/2016 1650 10/19/2016 1940 10/31/2016 2359  PHART 7.378 7.466* 7.416  PCO2ART 51.5* 40.0 45.8  PO2ART 63.6* 67.9* 84.8    Liver Enzymes  Recent Labs Lab 11/02/2016 1544  AST 75*  ALT 43  ALKPHOS 51  BILITOT 0.8  ALBUMIN 2.0*    Cardiac Enzymes No results for input(s): TROPONINI, PROBNP in the last 168 hours.  Glucose  Recent Labs Lab 10/29/2016 1554 10/31/16 0039 10/31/16 0441 10/31/16 0831 10/31/16 1231  10/31/16 1716  GLUCAP 86 88 109* 97 138* 128*    Imaging No results found.   STUDIES:  CXR 11/26 >> LUL PNA  CULTURES: Blood 11/26 >> GPC >> C. Diff 11/26 >> resp 11/27 >> GNR >>   ANTIBIOTICS: Vanc 11/26 >> Pip-tazo 11/26 >>  SIGNIFICANT EVENTS: Intubated (endotracheally) at OSH ED 11/26 >>  LINES/TUBES: PEG PICC 11/27  RUE >> Foley  DISCUSSION: 50 y/o man s/p devastating meningitis p/w fever, AMS & HCAP vs aspiration, CXR showing left white out now  ASSESSMENT / PLAN:  PULMONARY A: Acute resp failure/ ARDS Hx of trach x2 Left hemithorax opacification 11/29 P:   SBTs  Bronchoscopy today Consider early, permanent trach placement.  CARDIOVASCULAR A:  Chronic CHF -EF 40% 07/2016 Hx of autonomic dysreflexia 11/28 transient AF - converted to nSR P:  Neo gtt Stress-dose hydocort Lopressor 5 q4 prn Dc cardizem  RENAL A:   Hypokalemia Hypernatremia -resolved hypophos Acute kidney injury -resolved P:   Replete K/ phos ct free water in PEG   GASTROINTESTINAL A:   Need for TF Hypoalbuminemia P:   Resume  TF    HEMATOLOGIC A:   Leukopenia Anemia P:  Likely 2/2 sepsis  INFECTIOUS A:   Severe Sepsis, Lt HCAP May also have C.diff or viral gastroenteritis Hx of MRSA bactermia Hx of HCAP with Enterobacter >> MDR. P:   ct Pip-tazo + Vanc for now  ENDOCRINE A:   Adrenal insufficency - on 100 mg hydrocort daily P:   Stress dose steroids  NEUROLOGIC A:   Devastating CNS injury in March '17 Acute encephalopathy. Hx seizures, strep pneumo meningitis, CVA. P:   Continue depakote, keppra  Ct  Seroquel & clonazepam  Daily WUA - dc versed Prn meds  FAMILY  - Updates: wife shirley 11/29  - Inter-disciplinary family meet or Palliative Care meeting due by: 12/3, has been resistant to these conversations in the past  The patient is critically ill with multiple organ systems failure and requires high complexity decision making for  assessment and support, frequent evaluation and titration of therapies, application of advanced monitoring technologies and extensive interpretation of multiple databases. Critical Care Time devoted to patient care services described in this note independent of APP time is 35 minutes.    Cyril Mourningakesh Taiwo Fish MD. Tonny BollmanFCCP. [place] [facility] Pager [phone] If no response call [phone]      11/02/2016, 9:59 AM

## 2016-11-02 NOTE — Procedures (Signed)
Bronchoscopy Procedure Note Dale [name] [id number] 05/24/1966  Procedure: Bronchoscopy Indications: Diagnostic evaluation of the airways, Obtain specimens for culture and/or other diagnostic studies and Remove secretions  Procedure Details Consent: Risks of procedure as well as the alternatives and risks of each were explained to the (patient/caregiver).  Consent for procedure obtained. Time Out: Verified patient identification, verified procedure, site/side was marked, verified correct patient position, special equipment/implants available, medications/allergies/relevent history reviewed, required imaging and test results available.  Performed  In preparation for procedure, patient was given 100% FiO2 and bronchoscope lubricated. Sedation: Benzodiazepines versed 2 mg & fent 150 mcg in divided doses  Airway entered and the following bronchi were examined: Bronchi.   Procedures performed: BAL performed, thick inspissated mucus plugs removed from left sided airways Bronchoscope removed.  , Patient placed back on 100% FiO2 at conclusion of procedure.    Evaluation Hemodynamic Status: BP stable throughout; O2 sats: stable throughout Patient's Current Condition: stable Specimens:  Sent purulent fluid Complications: No apparent complications Patient did tolerate procedure well.   ALVA,RAKESH V. 11/02/2016

## 2016-11-02 NOTE — Progress Notes (Signed)
Versed bag expired in the room. Wasted entire contents of 50 cc. Witnessed by Kevin FentonPaula Trivette, RN.

## 2016-11-02 NOTE — Progress Notes (Signed)
eLink Physician-Brief Progress Note Patient Name: Dale [name] DOB: 06/08/1966 MRN: [id number]   Date of Service  11/02/2016  HPI/Events of Note  Hypothermia  eICU Interventions  Order for bair hugger        [name] 11/02/2016, 1:02 AM

## 2016-11-02 NOTE — Progress Notes (Signed)
[facility] 92.1. This was consistent with an oral temp and axillary temp. Orders received for a warming blanket.

## 2016-11-02 NOTE — Consult Note (Signed)
ENT CONSULT:  Reason for Consult: Ventilatory dependent respiratory failure Referring Physician: Critical care service  Dale [name] is an 50 y.o. male.  HPI: Patient with significant history of respiratory dysfunction, multiple intubations over the last year and tracheostomy formed on 8/18. Ht cannulated without difficulty. He is readmitted with respiratory failure and is currently intubated and ventilator supported.  Past Medical History:  Diagnosis Date  . Acute encephalopathy   . Acute ischemic stroke ([place])   . Acute renal failure ([place])   . Acute respiratory failure with hypoxia ([place])   . AKI (acute kidney injury) ([place]) 02/16/2016  . Altered mental status   . Cardiac arrest ([place])   . Cardiogenic shock ([place])   . Cerebral septic emboli ([place])   . Cerebral thrombosis with cerebral infarction 03/25/2016  . CHF (congestive heart failure) ([place])   . Convulsions/seizures ([place]) 05/04/2016   Seizure disorder - Continue Keppra, Depakote.     . Dementia 04/27/2016  . Depression   . Dysphagia 05/04/2016   Dysphagia - Dysphagia 1 diet per SLP     . ETOH abuse   . Hydrocephalus   . Hyperlipidemia 05/04/2016   Dyslipidemia - Continue statin.     Marland Kitchen Hypernatremia   . Hypothermia 03/20/2016  . Low serum cortisol level (H[facility]) 05/04/2016   Low a.m. cortisol  - Was 1.2 checked on 04/03/2016  - This likely is reflective of cortisol suppression from steroid use - Tapered steroids off   . Meningitis, pneumococcal, recurrent   . Meningitis, streptococcal 02/16/2016  . Meningoencephalitis   . NSTEMI (non-ST elevated myocardial infarction) ([place]) 03/14/2016  . Protein-calorie malnutrition, severe ([place])   . Respiratory failure ([place]) 03/03/2016  . Septic shock ([place])   . Severe sepsis ([place])   . 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 Substance abuse   . Systolic and diastolic CHF, chronic ([place])   . Tobacco abuse 02/16/2016  . 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 Urinary retention 05/04/2016   Urinary  retention - Continue Flomax       Past Surgical History:  Procedure Laterality Date  . ANKLE SURGERY    . CARDIAC CATHETERIZATION N/A 02/17/2016   Procedure: IABP Insertion;  Surgeon: Wellington Hampshire, MD;  Location: [facility];  Service: Cardiovascular;  Laterality: N/A;  . IR GENERIC HISTORICAL  08/01/2016   IR GASTROSTOMY TUBE MOD SED 08/01/2016 Aletta Edouard, MD [facility]  . TEE WITHOUT CARDIOVERSION N/A 03/29/2016   Procedure: TRANSESOPHAGEAL ECHOCARDIOGRAM (TEE);  Surgeon: Larey Dresser, MD;  Location: [facility];  Service: Cardiovascular;  Laterality: N/A;    History reviewed. No pertinent family history.  Social History:  reports that he has been smoking Cigarettes.  He has been smoking about 0.10 packs per day. He has never used smokeless tobacco. He reports that he uses drugs, including Marijuana. He reports that he does not drink alcohol.  Allergies: No Known Allergies  Medications: I have reviewed the patient's current medications.  Results for orders placed or performed during the hospital encounter of 10/11/2016 (from the past 48 hour(s))  Basic metabolic panel     Status: Abnormal   Collection Time: 10/31/16  5:59 PM  Result Value Ref Range   Sodium 142 135 - 145 mmol/L    Comment: RESULTS VERIFIED VIA RECOLLECT   Potassium 2.4 (LL) 3.5 - 5.1 mmol/L    Comment: CRITICAL RESULT CALLED TO, READ BACK BY AND VERIFIED WITH: Ezequiel Kayser RN [phone] [id number] GREEN R    Chloride 105  101 - 111 mmol/L   CO2 26 22 - 32 mmol/L   Glucose, Bld 131 (H) 65 - 99 mg/dL   BUN 25 (H) 6 - 20 mg/dL   Creatinine, Ser 1.18 0.61 - 1.24 mg/dL   Calcium 6.8 (L) 8.9 - 10.3 mg/dL   GFR calc non Af Amer >60 >60 mL/min   GFR calc Af Amer >60 >60 mL/min    Comment: (NOTE) The eGFR has been calculated using the CKD EPI equation. This calculation has not been validated in all clinical situations. eGFR's persistently <60 mL/min signify possible Chronic Kidney Disease.    Anion gap 11  5 - 15  Basic metabolic panel     Status: Abnormal   Collection Time: 11/01/16  2:38 AM  Result Value Ref Range   Sodium 144 135 - 145 mmol/L   Potassium 3.1 (L) 3.5 - 5.1 mmol/L    Comment: DELTA CHECK NOTED   Chloride 106 101 - 111 mmol/L   CO2 28 22 - 32 mmol/L   Glucose, Bld 119 (H) 65 - 99 mg/dL   BUN 20 6 - 20 mg/dL   Creatinine, Ser 0.92 0.61 - 1.24 mg/dL   Calcium 7.2 (L) 8.9 - 10.3 mg/dL   GFR calc non Af Amer >60 >60 mL/min   GFR calc Af Amer >60 >60 mL/min    Comment: (NOTE) The eGFR has been calculated using the CKD EPI equation. This calculation has not been validated in all clinical situations. eGFR's persistently <60 mL/min signify possible Chronic Kidney Disease.    Anion gap 10 5 - 15  CBC     Status: Abnormal   Collection Time: 11/01/16  2:38 AM  Result Value Ref Range   WBC 6.6 4.0 - 10.5 K/uL   RBC 2.99 (L) 4.22 - 5.81 MIL/uL   Hemoglobin 8.9 (L) 13.0 - 17.0 g/dL   HCT 27.8 (L) 39.0 - 52.0 %   MCV 93.0 78.0 - 100.0 fL   MCH 29.8 26.0 - 34.0 pg   MCHC 32.0 30.0 - 36.0 g/dL   RDW 17.6 (H) 11.5 - 15.5 %   Platelets 122 (L) 150 - 400 K/uL  Magnesium     Status: None   Collection Time: 11/01/16  2:38 AM  Result Value Ref Range   Magnesium 2.0 1.7 - 2.4 mg/dL  Phosphorus     Status: Abnormal   Collection Time: 11/01/16  2:38 AM  Result Value Ref Range   Phosphorus 1.8 (L) 2.5 - 4.6 mg/dL  Culture, bal-quantitative     Status: None (Preliminary result)   Collection Time: 11/02/16 11:01 AM  Result Value Ref Range   Specimen Description BRONCHIAL ALVEOLAR LAVAGE    Special Requests NONE    Gram Stain      RARE WBC PRESENT,BOTH PMN AND MONONUCLEAR NO ORGANISMS SEEN    Culture PENDING    Report Status PENDING   Basic metabolic panel     Status: Abnormal   Collection Time: 11/02/16  3:41 PM  Result Value Ref Range   Sodium 147 (H) 135 - 145 mmol/L   Potassium 3.8 3.5 - 5.1 mmol/L    Comment: DELTA CHECK NOTED   Chloride 111 101 - 111 mmol/L   CO2 28  22 - 32 mmol/L   Glucose, Bld 90 65 - 99 mg/dL   BUN 21 (H) 6 - 20 mg/dL   Creatinine, Ser 0.81 0.61 - 1.24 mg/dL   Calcium 7.8 (L) 8.9 - 10.3 mg/dL   GFR  calc non Af Amer >60 >60 mL/min   GFR calc Af Amer >60 >60 mL/min    Comment: (NOTE) The eGFR has been calculated using the CKD EPI equation. This calculation has not been validated in all clinical situations. eGFR's persistently <60 mL/min signify possible Chronic Kidney Disease.    Anion gap 8 5 - 15    Dg Chest Port 1 View  Result Date: 11/01/2016 CLINICAL DATA:  Hypoxemic respiratory failure with shortness of breath EXAM: PORTABLE CHEST 1 VIEW COMPARISON:  10/20/2016 FINDINGS: Endotracheal tube tip is approximately 2.9 cm superior to the carina. Right upper extremity catheter has been inserted and the tip projects over the mid SVC. Interim worsening of diffuse air space consolidation of the left hemi thorax. Slightly improved aeration of the right lung base. Cardiomediastinal silhouette is obscured. IMPRESSION: 1. Support lines and tubes as above. Right upper extremity catheter tip overlies the SVC. 2. Significant worsening of diffuse airspace disease in the left thorax. 3. Improved aeration of the right base Electronically Signed   By: Donavan Foil M.D.   On: 11/01/2016 04:01    ROS:ROS 12 systems reviewed and negative except as stated in HPI   Blood pressure 91/72, pulse 82, temperature 99.7 F (37.6 C), temperature source Core (Comment), resp. rate 20, height 5' 8" (1.727 m), weight 74.1 kg (163 lb 5.8 oz), SpO2 96 %.  PHYSICAL EXAM: General appearance - patient intubated and sedated and unable to cooperate with current exam. Mouth - Oral tracheal intubation, stable Neck - anterior neck scar from previous tracheostomy site, no palpable swelling or anatomic abnormality Chest - patient currently intubated and on ventilatory support  Studies Reviewed:none  Assessment/Plan: ENT service consult and for revision tracheostomy.  Given the patient's history of chronic pulmonary dysfunction and respiratory failure I would consider him for tracheostomy under general anesthesia in the Main LR. Patient tenably scheduled for surgery on Friday morning 12/1.  Miller,  11/02/2016, 5:25 PM

## 2016-11-03 ENCOUNTER — Inpatient Hospital Stay (HOSPITAL_COMMUNITY): Payer: Medicaid Other

## 2016-11-03 LAB — CULTURE, BLOOD (ROUTINE X 2)

## 2016-11-03 LAB — CBC
HEMATOCRIT: 26.4 % — AB (ref 39.0–52.0)
Hemoglobin: 8.2 g/dL — ABNORMAL LOW (ref 13.0–17.0)
MCH: 29.4 pg (ref 26.0–34.0)
MCHC: 31.1 g/dL (ref 30.0–36.0)
MCV: 94.6 fL (ref 78.0–100.0)
PLATELETS: 101 10*3/uL — AB (ref 150–400)
RBC: 2.79 MIL/uL — AB (ref 4.22–5.81)
RDW: 18.3 % — AB (ref 11.5–15.5)
WBC: 8.9 10*3/uL (ref 4.0–10.5)

## 2016-11-03 LAB — BASIC METABOLIC PANEL
ANION GAP: 8 (ref 5–15)
BUN: 20 mg/dL (ref 6–20)
CALCIUM: 8 mg/dL — AB (ref 8.9–10.3)
CO2: 29 mmol/L (ref 22–32)
Chloride: 112 mmol/L — ABNORMAL HIGH (ref 101–111)
Creatinine, Ser: 0.78 mg/dL (ref 0.61–1.24)
GLUCOSE: 98 mg/dL (ref 65–99)
POTASSIUM: 3.7 mmol/L (ref 3.5–5.1)
Sodium: 149 mmol/L — ABNORMAL HIGH (ref 135–145)

## 2016-11-03 LAB — SURGICAL PCR SCREEN
MRSA, PCR: NEGATIVE
STAPHYLOCOCCUS AUREUS: POSITIVE — AB

## 2016-11-03 LAB — PHOSPHORUS: PHOSPHORUS: 2.2 mg/dL — AB (ref 2.5–4.6)

## 2016-11-03 LAB — MAGNESIUM: Magnesium: 2 mg/dL (ref 1.7–2.4)

## 2016-11-03 MED ORDER — DILTIAZEM LOAD VIA INFUSION
10.0000 mg | Freq: Once | INTRAVENOUS | Status: AC
Start: 2016-11-03 — End: 2016-11-03
  Administered 2016-11-03: 10 mg via INTRAVENOUS
  Filled 2016-11-03: qty 10

## 2016-11-03 MED ORDER — DILTIAZEM HCL-DEXTROSE 100-5 MG/100ML-% IV SOLN (PREMIX)
5.0000 mg/h | INTRAVENOUS | Status: DC
  Administered 2016-11-03: 15 mg/h via INTRAVENOUS
  Administered 2016-11-03: 5 mg/h via INTRAVENOUS
  Administered 2016-11-04: 7.5 mg/h via INTRAVENOUS
  Filled 2016-11-03 (×4): qty 100

## 2016-11-03 MED ORDER — POTASSIUM & SODIUM PHOSPHATES 280-160-250 MG PO PACK
2.0000 | PACK | Freq: Three times a day (TID) | ORAL | Status: AC
  Administered 2016-11-03 (×3): 2 via ORAL
  Filled 2016-11-03 (×3): qty 2

## 2016-11-03 NOTE — Progress Notes (Signed)
Nutrition Follow-up  DOCUMENTATION CODES:   Severe malnutrition in context of chronic illness  INTERVENTION:   Continue enteral nutrition therapy via PEG Vital AF 1.2 @ 65 ml/hr (1560 ml)  Provides: 1872 kcal, 117 grams protein, and 1265 ml H2O.  Total free water: 2865 ml   NUTRITION DIAGNOSIS:   Malnutrition (Severe) related to chronic illness as evidenced by severe depletion of body fat, severe depletion of muscle mass. Ongoing.   GOAL:   Patient will meet greater than or equal to 90% of their needs Met.   MONITOR:   TF tolerance, I & O's, Vent status, Labs  ASSESSMENT:   Pt with strep pneumo meningitis c/b cardiac arrest and cardiogenic shock, ARDS requiring prolonged intubation and trach (now decannulated), and acute renal failure.  He was admitted to [facility] 06/16/16 through 07/11/16 for recurrent hypoglycemia due to adrenal insufficiency, new subacute CVA, seizures with inability to protect airway s/p intubation 7/13 through 7/21.  He re-presented 8/08 again with sepsis from UTI, and was intubated due to AMS, and was trached 8/18, and was discharged with a cuffless trach. That hospitalization was c/b MRSA bacteremia 8/24, and completed 3 weeks of IV vanc. He also developed C. Diff, and was treated with PO vanc through 9/11. He has had continued autonomic dysreflexia, which worsens during complications of acute illnesses. Admitted with N/V/D.   Plan for early permeant trach Abx for severe sepsis, L HCAP, and possible c.diff or viral gastroenteritis  Patient is currently intubated on ventilator support MV: 8.7 L/min Temp (24hrs), Avg:99.1 F (37.3 C), Min:97.5 F (36.4 C), Max:100.2 F (37.9 C)  Labs reviewed: Na 149, PO4 2.2 Free water 300 ml every 6 hours = 1200 ml   Diet Order:  Diet NPO time specified  Skin:  Wound (see comment) (unstageable right heel)  Last BM:  11/30 loose/yellow  Height:   Ht Readings from Last 1 Encounters:  10/31/16 5' 8" (1.727 m)     Weight:   Wt Readings from Last 1 Encounters:  11/03/16 162 lb 0.6 oz (73.5 kg)  Admission weight: 64.4 kg  Ideal Body Weight:  70 kg  BMI:  Body mass index is 24.64 kg/m.  Estimated Nutritional Needs:   Kcal:  1806  Protein:  100-120 grams  Fluid:  >1.8 L/day  EDUCATION NEEDS:   No education needs identified at this time  [place], [place], [place] Pager [phone] After Hours Pager

## 2016-11-03 NOTE — Progress Notes (Signed)
Noticed HR went from SR in the 70-80s to 130-150s irregular sustained, obtained 12-lead EKG showed Afib RVR. Notified Elink MD, gave PRN IV lopressor. HR remained in 130-150s sustained. Notified Elink MD of no change with PRN meds given. New order received to start cardizem gtt. Will start gtt and pass along information to day shift RN.

## 2016-11-03 NOTE — Progress Notes (Signed)
PULMONARY / CRITICAL CARE MEDICINE   Name: Dale [name] MRN: [id number] DOB: 03-23-1966    ADMISSION DATE:  2016-11-13  CHIEF COMPLAINT:  Nausea and Vomiting  HISTORY OF PRESENT ILLNESS:   Dale [name] is non-verbal, and history is obtained from the chart. Brief history from Dr. Kendrick Fries 07/12/16: Dale [name] is an 50 y.o. M with PMH as outlined below including admission in March 2017 with strep pneumo meningitis c/b cardiac arrest and cardiogenic shock, ARDS requiring prolonged intubation and trach (now decannulated), and acute renal failure.  He was admitted to [facility] 06/16/16 through 07/11/16 for recurrent hypoglycemia due to adrenal insufficiency, new subacute CVA, seizures with inability to protect airway s/p intubation 7/13 through 7/21.  He re-presented 8/08 again with sepsis from UTI, and was intubated due to AMS, and was trached 8/18, and was discharged with a cuffless trach. That hospitalization was c/b MRSA bacteremia 8/24, and completed 3 weeks of IV vanc. He also developed C. Diff, and was treated with PO vanc through 9/11. He has had continued autonomic dysreflexia, which worsens during complications of acute illnesses.  He was decannulated / pulled out trach last time. He was brought to the ED with decreased level of consciousness and nausea/vomiting/diarrhea. A CXR showed consolidation of the left upper lobe. He was intubated and [facility].   Subj - intubated, sedated Afebrile Back in a fibn, off cardizem, on neo gtt  remains critically ill    VITAL SIGNS: BP 100/69 (BP Location: Left Arm)   Pulse 95   Temp 97.5 F (36.4 C) (Core (Comment))   Resp 20   Ht 5\' 8"  (1.727 m)   Wt 162 lb 0.6 oz (73.5 kg)   SpO2 91%   BMI 24.64 kg/m   HEMODYNAMICS:    VENTILATOR SETTINGS: Vent Mode: PRVC FiO2 (%):  [40 %-60 %] 40 % Set Rate:  [20 bmp] 20 bmp Vt Set:  [420 mL] 420 mL PEEP:  [5 cmH20] 5 cmH20 Plateau Pressure:  [17 cmH20-21 cmH20] 18 cmH20  INTAKE / OUTPUT: I/O last  3 completed shifts: In: 5424.9 [I.V.:664.9; NG/GT:3960; IV Piggyback:800] Out: 4845 [Urine:4845]  PHYSICAL EXAMINATION: General: Middle aged male , chr ill appearing Neuro: Non-responsive, sedated HEENT: [place]/AT. PERRL, sclerae anicteric. Cardiovascular: RRR, no M/R/G.  Lungs: Respirations even and unlabored.  CTA bilaterally, No W/R/R. Abdomen: BS x 4, soft, NT/ND.  Musculoskeletal: No gross deformities, no edema.  Skin: Intact, warm, no rashes.  LABS:  BMET  Recent Labs Lab 11/01/16 0238 11/02/16 1541 11/03/16 0420  NA 144 147* 149*  K 3.1* 3.8 3.7  CL 106 111 112*  CO2 28 28 29   BUN 20 21* 20  CREATININE 0.92 0.81 0.78  GLUCOSE 119* 90 98    Electrolytes  Recent Labs Lab 2016-11-13 2336  11/01/16 0238 11/02/16 1541 11/03/16 0420  CALCIUM 7.3*  < > 7.2* 7.8* 8.0*  MG 1.8  --  2.0  --  2.0  PHOS 6.3*  --  1.8*  --  2.2*  < > = values in this interval not displayed.  CBC  Recent Labs Lab 2016-11-13 1544 2016-11-13 1555 11/01/16 0238 11/03/16 0420  WBC 3.4*  --  6.6 8.9  HGB 11.9* 11.9* 8.9* 8.2*  HCT 37.2* 35.0* 27.8* 26.4*  PLT 218  --  122* 101*    Coag's No results for input(s): APTT, INR in the last 168 hours.  Sepsis Markers  Recent Labs Lab 11-13-2016 1841 13-Nov-2016 2336 10/31/16 0336  LATICACIDVEN 5.38* 5.2*  4.0*    ABG  Recent Labs Lab 10/20/2016 1650 10/31/2016 1940 10/24/2016 2359  PHART 7.378 7.466* 7.416  PCO2ART 51.5* 40.0 45.8  PO2ART 63.6* 67.9* 84.8    Liver Enzymes  Recent Labs Lab 10/20/2016 1544  AST 75*  ALT 43  ALKPHOS 51  BILITOT 0.8  ALBUMIN 2.0*    Cardiac Enzymes No results for input(s): TROPONINI, PROBNP in the last 168 hours.  Glucose  Recent Labs Lab 10/25/2016 1554 10/31/16 0039 10/31/16 0441 10/31/16 0831 10/31/16 1231 10/31/16 1716  GLUCAP 86 88 109* 97 138* 128*    Imaging Dg Chest Port 1 View  Result Date: 11/03/2016 CLINICAL DATA:  Acute onset respiratory failure. Shortness of breath.  Initial encounter. EXAM: PORTABLE CHEST 1 VIEW COMPARISON:  Chest radiograph performed 11/01/2016 FINDINGS: The patient's endotracheal tube is seen ending 5 cm above the carina. A right PICC is noted ending about the mid SVC. There is persistent diffuse left-sided airspace opacification, concerning for pneumonia. A small left pleural effusion is suspected. No pneumothorax is seen. The cardiomediastinal silhouette is mildly enlarged. No acute osseous abnormalities are identified. IMPRESSION: 1. Endotracheal tube seen ending 5 cm above the carina. 2. Persistent diffuse left-sided airspace opacification, concerning for pneumonia. Suspect small left pleural effusion. 3. Mild cardiomegaly. Electronically Signed   By: Roanna RaiderJeffery  Chang M.D.   On: 11/03/2016 06:22     STUDIES:  CXR 11/26 >> LUL PNA  CULTURES: Blood 11/26 >> coag neg staph 1/2  C. Diff 11/26 >> resp 11/27 >>  E coli    ANTIBIOTICS: Vanc 11/26 >>11/30 Pip-tazo 11/26 >>  SIGNIFICANT EVENTS: Intubated (endotracheally) at [facility] ED 11/26 >> 11/29 bronch - left inspissated pus  LINES/TUBES: PEG PICC 11/27  RUE >> Foley  DISCUSSION: 50 y/o man s/p devastating meningitis p/w fever, AMS & HCAP vs aspiration, CXR showing left white out now  ASSESSMENT / PLAN:  PULMONARY A: Acute resp failure/ ARDS Hx of trach x2 Left hemithorax opacification 11/29 P:   SBTs   trach planned for 12/1  CARDIOVASCULAR A:  Chronic CHF -EF 40% 07/2016 Hx of autonomic dysreflexia 11/28 transient AF - converted to nSR P:  Off Neo gtt Stress-dose hydocort Lopressor 5 q4 prn Ct cardizem gtt to target rate control  RENAL A:   Hypokalemia Hypernatremia  hypophos Acute kidney injury -resolved P:   Replete K/ phos ct free water in PEG   GASTROINTESTINAL A:   Need for TF Hypoalbuminemia P:   ct TF    HEMATOLOGIC A:   Anemia P:  Likely 2/2 chronic disease  INFECTIOUS A:   Severe Sepsis, Lt HCAP May also have C.diff or viral  gastroenteritis Hx of MRSA bactermia Hx of HCAP with Enterobacter >> MDR. P:   ceftx , dc zosyn/ Vanc   ENDOCRINE A:   Adrenal insufficency - on 100 mg hydrocort daily P:   Stress dose steroids  NEUROLOGIC A:   Devastating CNS injury in March '17 Acute encephalopathy. Hx seizures, strep pneumo meningitis, CVA. P:   Continue depakote, keppra  Ct  Seroquel & clonazepam  Daily WUA - dc versed Prn meds  FAMILY  - Updates: wife shirley 11/29, agreeable to trach , agreeable to palliative conversation 'after the holidays'  - Inter-disciplinary family meet or Palliative Care meeting due by: 12/3, has been resistant to these conversations in the past  The patient is critically ill with multiple organ systems failure and requires high complexity decision making for assessment and support, frequent evaluation and titration  of therapies, application of advanced monitoring technologies and extensive interpretation of multiple databases. Critical Care Time devoted to patient care services described in this note independent of APP time is 35 minutes.    Cyril Mourningakesh Kianah Harries MD. Tonny BollmanFCCP. [facility] Pager [phone] If no response call [phone]      11/03/2016, 10:18 AM

## 2016-11-04 ENCOUNTER — Inpatient Hospital Stay (HOSPITAL_COMMUNITY): Payer: Medicaid Other | Admitting: Anesthesiology

## 2016-11-04 ENCOUNTER — Encounter (HOSPITAL_COMMUNITY): Admission: EM | Disposition: E | Payer: Self-pay | Source: Home / Self Care | Attending: Internal Medicine

## 2016-11-04 ENCOUNTER — Encounter (HOSPITAL_COMMUNITY): Payer: Self-pay | Admitting: Anesthesiology

## 2016-11-04 HISTORY — PX: TRACHEOSTOMY TUBE PLACEMENT: SHX814

## 2016-11-04 LAB — C DIFFICILE QUICK SCREEN W PCR REFLEX
C Diff antigen: NEGATIVE
C Diff interpretation: NOT DETECTED
C Diff toxin: NEGATIVE

## 2016-11-04 LAB — BASIC METABOLIC PANEL
Anion gap: 11 (ref 5–15)
BUN: 20 mg/dL (ref 6–20)
CO2: 29 mmol/L (ref 22–32)
Calcium: 8 mg/dL — ABNORMAL LOW (ref 8.9–10.3)
Chloride: 109 mmol/L (ref 101–111)
Creatinine, Ser: 0.75 mg/dL (ref 0.61–1.24)
GFR calc Af Amer: >60 mL/min (ref >60)
GFR calc non Af Amer: >60 mL/min (ref >60)
Glucose, Bld: 98 mg/dL (ref 65–99)
Potassium: 3.7 mmol/L (ref 3.5–5.1)
Sodium: 149 mmol/L — ABNORMAL HIGH (ref 135–145)

## 2016-11-04 LAB — CULTURE, BLOOD (ROUTINE X 2): Culture: NO GROWTH

## 2016-11-04 LAB — CULTURE, BAL-QUANTITATIVE

## 2016-11-04 LAB — GLUCOSE, CAPILLARY: GLUCOSE-CAPILLARY: 95 mg/dL (ref 65–99)

## 2016-11-04 LAB — MAGNESIUM: MAGNESIUM: 1.9 mg/dL (ref 1.7–2.4)

## 2016-11-04 LAB — PHOSPHORUS: PHOSPHORUS: 4.3 mg/dL (ref 2.5–4.6)

## 2016-11-04 LAB — CBC
HCT: 27 % — ABNORMAL LOW (ref 39.0–52.0)
HEMOGLOBIN: 8.4 g/dL — AB (ref 13.0–17.0)
MCH: 29.4 pg (ref 26.0–34.0)
MCHC: 31.1 g/dL (ref 30.0–36.0)
MCV: 94.4 fL (ref 78.0–100.0)
PLATELETS: 98 10*3/uL — AB (ref 150–400)
RBC: 2.86 MIL/uL — AB (ref 4.22–5.81)
RDW: 18.2 % — ABNORMAL HIGH (ref 11.5–15.5)
WBC: 8.3 10*3/uL (ref 4.0–10.5)

## 2016-11-04 SURGERY — CREATION, TRACHEOSTOMY
Anesthesia: General | Site: Neck

## 2016-11-04 MED ORDER — MIDAZOLAM HCL 5 MG/5ML IJ SOLN
INTRAMUSCULAR | Status: DC | PRN
  Administered 2016-11-04: 1 mg via INTRAVENOUS

## 2016-11-04 MED ORDER — LACTATED RINGERS IV SOLN: INTRAVENOUS | Status: DC

## 2016-11-04 MED ORDER — MEPERIDINE HCL 25 MG/ML IJ SOLN: 6.2500 mg | INTRAMUSCULAR | Status: DC | PRN

## 2016-11-04 MED ORDER — 0.9 % SODIUM CHLORIDE (POUR BTL) OPTIME
TOPICAL | Status: DC | PRN
  Administered 2016-11-04: 1000 mL

## 2016-11-04 MED ORDER — PHENYLEPHRINE HCL 10 MG/ML IJ SOLN
INTRAMUSCULAR | Status: DC | PRN
  Administered 2016-11-04: 80 ug via INTRAVENOUS

## 2016-11-04 MED ORDER — CIPROFLOXACIN HCL 750 MG PO TABS
750.0000 mg | ORAL_TABLET | Freq: Two times a day (BID) | ORAL | Status: DC
  Administered 2016-11-04 – 2016-11-11 (×14): 750 mg via ORAL
  Filled 2016-11-04: qty 2
  Filled 2016-11-04 (×2): qty 1
  Filled 2016-11-04: qty 2
  Filled 2016-11-04: qty 1
  Filled 2016-11-04 (×4): qty 2
  Filled 2016-11-04 (×3): qty 1
  Filled 2016-11-04: qty 2
  Filled 2016-11-04: qty 1

## 2016-11-04 MED ORDER — LIDOCAINE-EPINEPHRINE 1 %-1:100000 IJ SOLN
INTRAMUSCULAR | Status: DC | PRN
  Administered 2016-11-04: 20 mL

## 2016-11-04 MED ORDER — FENTANYL CITRATE (PF) 100 MCG/2ML IJ SOLN
INTRAMUSCULAR | Status: DC | PRN
  Administered 2016-11-04 (×2): 50 ug via INTRAVENOUS

## 2016-11-04 MED ORDER — MIDAZOLAM HCL 2 MG/2ML IJ SOLN
INTRAMUSCULAR | Status: AC
  Filled 2016-11-04: qty 2

## 2016-11-04 MED ORDER — PROPOFOL 10 MG/ML IV BOLUS
INTRAVENOUS | Status: AC
  Filled 2016-11-04: qty 20

## 2016-11-04 MED ORDER — FENTANYL CITRATE (PF) 100 MCG/2ML IJ SOLN: 25.0000 ug | INTRAMUSCULAR | Status: DC | PRN

## 2016-11-04 MED ORDER — FENTANYL CITRATE (PF) 100 MCG/2ML IJ SOLN
INTRAMUSCULAR | Status: AC
  Filled 2016-11-04: qty 2

## 2016-11-04 MED ORDER — METOCLOPRAMIDE HCL 5 MG/ML IJ SOLN: 10.0000 mg | Freq: Once | INTRAMUSCULAR | Status: DC | PRN

## 2016-11-04 SURGICAL SUPPLY — 40 items
BLADE SURG 15 STRL LF DISP TIS (BLADE) IMPLANT
BLADE SURG 15 STRL SS (BLADE)
BLADE SURG ROTATE 9660 (MISCELLANEOUS) IMPLANT
CANISTER SUCTION 2500CC (MISCELLANEOUS) ×2 IMPLANT
CLEANER TIP ELECTROSURG 2X2 (MISCELLANEOUS) ×2 IMPLANT
COVER SURGICAL LIGHT HANDLE (MISCELLANEOUS) ×2 IMPLANT
DRAPE PROXIMA HALF (DRAPES) IMPLANT
ELECT COATED BLADE 2.86 ST (ELECTRODE) ×2 IMPLANT
ELECT REM PT RETURN 9FT ADLT (ELECTROSURGICAL) ×2
ELECTRODE REM PT RTRN 9FT ADLT (ELECTROSURGICAL) ×1 IMPLANT
GAUZE SPONGE 4X4 16PLY XRAY LF (GAUZE/BANDAGES/DRESSINGS) ×2 IMPLANT
GAUZE XEROFORM 5X9 LF (GAUZE/BANDAGES/DRESSINGS) IMPLANT
GLOVE BIO SURGEON STRL SZ 6.5 (GLOVE) ×2 IMPLANT
GLOVE BIOGEL M 7.0 STRL (GLOVE) ×2 IMPLANT
GLOVE BIOGEL PI IND STRL 6.5 (GLOVE) ×1 IMPLANT
GLOVE BIOGEL PI INDICATOR 6.5 (GLOVE) ×1
GLOVE SURG SS PI 6.5 STRL IVOR (GLOVE) ×4 IMPLANT
GOWN STRL REUS W/ TWL LRG LVL3 (GOWN DISPOSABLE) ×4 IMPLANT
GOWN STRL REUS W/TWL LRG LVL3 (GOWN DISPOSABLE) ×8
HOLDER TRACH TUBE VELCRO 19.5 (MISCELLANEOUS) IMPLANT
KIT BASIN OR (CUSTOM PROCEDURE TRAY) ×2 IMPLANT
KIT ROOM TURNOVER OR (KITS) ×2 IMPLANT
KIT SUCTION CATH 14FR (SUCTIONS) IMPLANT
NEEDLE HYPO 25GX1X1/2 BEV (NEEDLE) ×2 IMPLANT
NS IRRIG 1000ML POUR BTL (IV SOLUTION) ×2 IMPLANT
PACK EENT II TURBAN DRAPE (CUSTOM PROCEDURE TRAY) ×2 IMPLANT
PAD ARMBOARD 7.5X6 YLW CONV (MISCELLANEOUS) ×4 IMPLANT
PENCIL BUTTON HOLSTER BLD 10FT (ELECTRODE) ×2 IMPLANT
SPONGE DRAIN TRACH 4X4 STRL 2S (GAUZE/BANDAGES/DRESSINGS) IMPLANT
SPONGE INTESTINAL PEANUT (DISPOSABLE) ×2 IMPLANT
SUT CHROMIC 2 0 SH (SUTURE) IMPLANT
SUT ETHILON 2 0 FS 18 (SUTURE) ×2 IMPLANT
SUT SILK 2 0 FS (SUTURE) IMPLANT
SUT SILK 3 0 REEL (SUTURE) IMPLANT
SYR 20CC LL (SYRINGE) ×2 IMPLANT
SYR BULB IRRIGATION 50ML (SYRINGE) IMPLANT
SYR CONTROL 10ML LL (SYRINGE) ×2 IMPLANT
TUBE CONNECTING 12X1/4 (SUCTIONS) IMPLANT
TUBE TRACH SHILEY  6 DIST  CUF (TUBING) ×2 IMPLANT
WATER STERILE IRR 1000ML POUR (IV SOLUTION) IMPLANT

## 2016-11-04 NOTE — Progress Notes (Signed)
   ENT Progress Note: Procedure(s): TRACHEOSTOMY   Subjective: Stable   Objective: Vital signs in last 24 hours: Temp:  [97.9 F (36.6 C)-99.1 F (37.3 C)] 97.9 F (36.6 C) (12/01 0805) Pulse Rate:  [63-101] 63 (12/01 1000) Resp:  [18-21] 20 (12/01 1000) BP: (93-127)/(67-86) 127/86 (12/01 1000) SpO2:  [91 %-98 %] 95 % (12/01 1000) FiO2 (%):  [40 %] 40 % (12/01 0805) Weight:  [75 kg (165 lb 5.5 oz)] 75 kg (165 lb 5.5 oz) (12/01 0500) Weight change: 1.5 kg (3 lb 4.9 oz) Last BM Date: 11/19/2016  Intake/Output from previous day: 11/30 0701 - 12/01 0700 In: 2619.3 [I.V.:469.3; NG/GT:2100; IV Piggyback:50] Out: 1725 [Urine:1725] Intake/Output this shift: Total I/O In: 105 [I.V.:30; Other:75] Out: 200 [Urine:200]  Labs:  Recent Labs  11/03/16 0420 11/24/2016 0415  WBC 8.9 8.3  HGB 8.2* 8.4*  HCT 26.4* 27.0*  PLT 101* 98*    Recent Labs  11/02/16 1541 11/03/16 0420  NA 147* 149*  K 3.8 3.7  CL 111 112*  CO2 28 29  GLUCOSE 90 98  BUN 21* 20  CALCIUM 7.8* 8.0*    Studies/Results: Dg Chest Port 1 View  Result Date: 11/03/2016 CLINICAL DATA:  Acute onset respiratory failure. Shortness of breath. Initial encounter. EXAM: PORTABLE CHEST 1 VIEW COMPARISON:  Chest radiograph performed 11/01/2016 FINDINGS: The patient's endotracheal tube is seen ending 5 cm above the carina. A right PICC is noted ending about the mid SVC. There is persistent diffuse left-sided airspace opacification, concerning for pneumonia. A small left pleural effusion is suspected. No pneumothorax is seen. The cardiomediastinal silhouette is mildly enlarged. No acute osseous abnormalities are identified. IMPRESSION: 1. Endotracheal tube seen ending 5 cm above the carina. 2. Persistent diffuse left-sided airspace opacification, concerning for pneumonia. Suspect small left pleural effusion. 3. Mild cardiomegaly. Electronically Signed   By: Roanna RaiderJeffery  Chang M.D.   On: 11/03/2016 06:22     PHYSICAL  EXAM: No change OT intubation   Assessment/Plan: Pt for tracheostomy    Boen Sterbenz 11/10/2016, 11:06 AM

## 2016-11-04 NOTE — Brief Op Note (Signed)
10-Sep-2016 - 11/10/2016  12:38 PM  PATIENT:  Bonnita Hollowraig D Tate  50 y.o. male  PRE-OPERATIVE DIAGNOSIS:  respiratory failure  POST-OPERATIVE DIAGNOSIS:  respiratory failure  PROCEDURE:  Procedure(s): TRACHEOSTOMY (N/A)  SURGEON:  Surgeon(s) and Role:    * Osborn Cohoavid Jeanna Giuffre, MD - Primary  PHYSICIAN ASSISTANT:   ASSISTANTS: none   ANESTHESIA:   general  EBL:  Total I/O In: 105 [I.V.:30; Other:75] Out: 200 [Urine:200]  BLOOD ADMINISTERED:none  DRAINS: none   LOCAL MEDICATIONS USED:  LIDOCAINE  and Amount: 4 ml  SPECIMEN:  No Specimen  DISPOSITION OF SPECIMEN:  N/A  COUNTS:  YES  TOURNIQUET:  * No tourniquets in log *  DICTATION: .Other Dictation: Dictation Number [id number]  PLAN OF CARE: Admit to inpatient   PATIENT DISPOSITION:  PACU - hemodynamically stable.   Delay start of Pharmacological VTE agent (>24hrs) due to surgical blood loss or risk of bleeding: no

## 2016-11-04 NOTE — Anesthesia Preprocedure Evaluation (Signed)
Anesthesia Evaluation  Patient identified by MRN, date of birth, ID band Patient unresponsive    Reviewed: Allergy & Precautions, NPO status , Patient's Chart, lab work & pertinent test results, Unable to perform ROS - Chart review only  Airway Mallampati: Intubated  TM Distance: >3 FB Neck ROM: Full    Dental no notable dental hx.    Pulmonary neg pulmonary ROS, Current Smoker,  Acute respiratory failure    + decreased breath sounds      Cardiovascular + Past MI and +CHF  negative cardio ROS   Rhythm:Regular Rate:Normal     Neuro/Psych Seizures -,  S/p meningities 3/17 with cardiac arrest and cardigenic septic shock. CVA negative neurological ROS  negative psych ROS   GI/Hepatic negative GI ROS, Neg liver ROS,   Endo/Other  negative endocrine ROS  Renal/GU negative Renal ROS  negative genitourinary   Musculoskeletal negative musculoskeletal ROS (+)   Abdominal   Peds negative pediatric ROS (+)  Hematology negative hematology ROS (+)   Anesthesia Other Findings   Reproductive/Obstetrics negative OB ROS                             Anesthesia Physical Anesthesia Plan  ASA: IV  Anesthesia Plan: General   Post-op Pain Management:    Induction: Intravenous  Airway Management Planned: Oral ETT  Additional Equipment:   Intra-op Plan:   Post-operative Plan: Post-operative intubation/ventilation  Informed Consent: I have reviewed the patients History and Physical, chart, labs and discussed the procedure including the risks, benefits and alternatives for the proposed anesthesia with the patient or authorized representative who has indicated his/her understanding and acceptance.   Dental advisory given  Plan Discussed with: CRNA  Anesthesia Plan Comments:         Anesthesia Quick Evaluation

## 2016-11-04 NOTE — Anesthesia Postprocedure Evaluation (Signed)
Anesthesia Post Note  Patient: Dale [name]  Procedure(s) Performed: Procedure(s) (LRB): TRACHEOSTOMY (N/A)  Patient location during evaluation: ICU Anesthesia Type: General Level of consciousness: sedated Pain management: pain level controlled Vital Signs Assessment: post-procedure vital signs reviewed and stable Respiratory status: patient remains intubated per anesthesia plan Cardiovascular status: stable Anesthetic complications: no    Last Vitals:  Vitals:   11/26/2016 1151 11/17/2016 1300  BP: 132/87 102/75  Pulse: 67 89  Resp: 20 20  Temp:  36.5 C    Last Pain:  Vitals:   11/30/2016 1300  TempSrc: Axillary                 Phillips Groutarignan, Kari Kerth

## 2016-11-04 NOTE — Transfer of Care (Signed)
Immediate Anesthesia Transfer of Care Note  Patient: Dale [name]  Procedure(s) Performed: Procedure(s): TRACHEOSTOMY (N/A)  Patient Location: ICU  Anesthesia Type:General  Level of Consciousness: sedated and Patient remains intubated per anesthesia plan  Airway & Oxygen Therapy: Patient remains intubated per anesthesia plan and Patient placed on Ventilator (see vital sign flow sheet for setting)  Post-op Assessment: Report given to RN and Post -op Vital signs reviewed and stable  Post vital signs: Reviewed and stable  Last Vitals:  Vitals:   11/20/2016 1100 11/20/2016 1151  BP: 132/87 132/87  Pulse: 67 67  Resp: 20 20  Temp:      Last Pain:  Vitals:   11/17/2016 0805  TempSrc: Axillary         Complications: No apparent anesthesia complications

## 2016-11-04 NOTE — Anesthesia Procedure Notes (Signed)
Date/Time: 11/23/2016 12:04 PM Performed by: Gwenyth AllegraADAMI, Butler Vegh Pre-anesthesia Checklist: Patient identified, Emergency Drugs available, Suction available, Patient being monitored and Timeout performed Oxygen Delivery Method: Circle system utilized Intubation Type: Inhalational induction and IV induction Placement Confirmation: positive ETCO2 and breath sounds checked- equal and bilateral

## 2016-11-04 NOTE — OR Nursing (Signed)
Obturator taped to head of bed prior to leaving OR. 

## 2016-11-04 NOTE — Progress Notes (Signed)
Patient has returned from OR and is back on the ventilator. RT will continue to monitor.

## 2016-11-04 NOTE — Progress Notes (Signed)
PULMONARY / CRITICAL CARE MEDICINE   Name: Dale [name] MRN: [id number] DOB: 09/12/1966    ADMISSION DATE:  10/22/2016  CHIEF COMPLAINT:  Nausea and Vomiting  HISTORY OF PRESENT ILLNESS:   Dale [name] is non-verbal, and history is obtained from the chart. Brief history from Dr. Kendrick FriesMcQuaid 07/12/16: Dale [name] is an Dale [name] with PMH as outlined below including admission in March 2017 with strep pneumo meningitis c/b cardiac arrest and cardiogenic shock, ARDS requiring prolonged intubation and trach (now decannulated), and acute renal failure.  He was admitted to [facility] 06/16/16 through 07/11/16 for recurrent hypoglycemia due to adrenal insufficiency, new subacute CVA, seizures with inability to protect airway s/p intubation 7/13 through 7/21.  He re-presented 8/08 again with sepsis from UTI, and was intubated due to AMS, and was trached 8/18, and was discharged with a cuffless trach. That hospitalization was c/b MRSA bacteremia 8/24, and completed 3 weeks of IV vanc. He also developed C. Diff, and was treated with PO vanc through 9/11. He has had continued autonomic dysreflexia, which worsens during complications of acute illnesses.  He was decannulated / pulled out trach last time. He was brought to the ED with decreased level of consciousness and nausea/vomiting/diarrhea. A CXR showed consolidation of the left upper lobe. He was intubated and [facility].   Subj - intubated, sedated Afebrile Back in nSR , off cardizem, off neo gtt  remains critically ill    VITAL SIGNS: BP 108/79   Pulse 64   Temp 97.9 F (36.6 C) (Axillary)   Resp 20   Ht 5\' 8"  (1.727 [name])   Wt 165 lb 5.5 oz (75 kg)   SpO2 94%   BMI 25.14 kg/[name]   HEMODYNAMICS:    VENTILATOR SETTINGS: Vent Mode: PRVC FiO2 (%):  [40 %] 40 % Set Rate:  [20 bmp] 20 bmp Vt Set:  [420 mL] 420 mL PEEP:  [5 cmH20] 5 cmH20 Plateau Pressure:  [14 cmH20-20 cmH20] 14 cmH20  INTAKE / OUTPUT: I/O last 3 completed shifts: In: 4254.3  [I.V.:589.3; NG/GT:3415; IV Piggyback:250] Out: 3425 [Urine:3425]  PHYSICAL EXAMINATION: General: Middle aged male , chr ill appearing Neuro: Non-responsive, sedated HEENT: [place]/AT. PERRL, sclerae anicteric. Cardiovascular: RRR, no [name]/R/G.  Lungs: Respirations even and unlabored.  CTA bilaterally, No W/R/R. Abdomen: BS x 4, soft, NT/ND.  Musculoskeletal: No gross deformities, no edema.  Skin: Intact, warm, no rashes.  LABS:  BMET  Recent Labs Lab 11/01/16 0238 11/02/16 1541 11/03/16 0420  NA 144 147* 149*  K 3.1* 3.8 3.7  CL 106 111 112*  CO2 28 28 29   BUN 20 21* 20  CREATININE 0.92 0.81 0.78  GLUCOSE 119* 90 98    Electrolytes  Recent Labs Lab 11/01/16 0238 11/02/16 1541 11/03/16 0420 02-05-2016 0415  CALCIUM 7.2* 7.8* 8.0*  --   MG 2.0  --  2.0 1.9  PHOS 1.8*  --  2.2* 4.3    CBC  Recent Labs Lab 11/01/16 0238 11/03/16 0420 02-05-2016 0415  WBC 6.6 8.9 8.3  HGB 8.9* 8.2* 8.4*  HCT 27.8* 26.4* 27.0*  PLT 122* 101* 98*    Coag's No results for input(s): APTT, INR in the last 168 hours.  Sepsis Markers  Recent Labs Lab 10/10/2016 1841 10/17/2016 2336 10/31/16 0336  LATICACIDVEN 5.38* 5.2* 4.0*    ABG  Recent Labs Lab 10/14/2016 1650 10/13/2016 1940 10/06/2016 2359  PHART 7.378 7.466* 7.416  PCO2ART 51.5* 40.0 45.8  PO2ART 63.6* 67.9* 84.8  Liver Enzymes  Recent Labs Lab 10/13/2016 1544  AST 75*  ALT 43  ALKPHOS 51  BILITOT 0.8  ALBUMIN 2.0*    Cardiac Enzymes No results for input(s): TROPONINI, PROBNP in the last 168 hours.  Glucose  Recent Labs Lab 10/12/2016 1554 10/31/16 0039 10/31/16 0441 10/31/16 0831 10/31/16 1231 10/31/16 1716  GLUCAP 86 88 109* 97 138* 128*    Imaging No results found.   STUDIES:  CXR 11/26 >> LUL PNA  CULTURES: Blood 11/26 >> coag neg staph 1/2  C. Diff 11/26 >> resp 11/27 >>  E coli    ANTIBIOTICS: Vanc 11/26 >>11/30 Pip-tazo 11/26 >>  SIGNIFICANT EVENTS: Intubated (endotracheally) at  [facility] ED 11/26 >> 11/29 bronch - left inspissated pus  LINES/TUBES: PEG PICC 11/27  RUE >> Foley  DISCUSSION: 50 y/o man s/p devastating meningitis p/w fever, AMS & HCAP vs aspiration, CXR showing left white out now  ASSESSMENT / PLAN:  PULMONARY A: Acute resp failure/ ARDS Hx of trach x2 Left hemithorax opacification 11/29 P:   SBTs   trach planned for 12/1   CARDIOVASCULAR A:  Chronic CHF -EF 40% 07/2016 Hx of autonomic dysreflexia 11/28 transient AF - converted to nSR P:  Off Neo gtt Stress-dose hydocort Lopressor 5 q4 prn Ct cardizem gtt to target rate control -transition to oral post op  RENAL A:   Hypokalemia Hypernatremia  hypophos Acute kidney injury -resolved P:   Lytes as needed ct free water in PEG   GASTROINTESTINAL A:   Need for TF Hypoalbuminemia P:   ct TF post op   HEMATOLOGIC A:   Anemia P:  Likely 2/2 chronic disease  INFECTIOUS A:   Severe Sepsis, Lt HCAP - e coli  May also have C.diff  Hx of MRSA bactermia Hx of HCAP with Enterobacter >> MDR. P:   ceftx , dc zosyn/ Vanc  Check c diff  ENDOCRINE A:   Adrenal insufficency - on 100 mg hydrocort daily P:   Stress dose steroids  NEUROLOGIC A:   Devastating CNS injury in March '17 Acute encephalopathy. Hx seizures, strep pneumo meningitis, CVA. P:   Continue depakote, keppra  Ct  Seroquel & clonazepam  Daily WUA  Prn meds  FAMILY  - Updates: wife shirley 11/29, agreeable to trach , agreeable to palliative conversation 'after the holidays' - will involve  - Inter-disciplinary family meet or Palliative Care meeting due by: 12/3, has been resistant to these conversations in the past  The patient is critically ill with multiple organ systems failure and requires high complexity decision making for assessment and support, frequent evaluation and titration of therapies, application of advanced monitoring technologies and extensive interpretation of multiple databases.  Critical Care Time devoted to patient care services described in this note independent of APP time is 35 minutes.    Cyril Mourningakesh Harry Shuck MD. Tonny BollmanFCCP. [facility] Pager [phone] If no response call [phone]      11/10/2016, 9:53 AM

## 2016-11-04 NOTE — Progress Notes (Signed)
Palliative Medicine consult noted. Due to high referral volume, there may be a delay seeing this patient. Please call the Palliative Medicine Team office at [phone] if recommendations are needed in the interim.  Thank you for inviting us to see this patient.  Margret ChanceMelanie G. Alonni Heimsoth, RN, BSN, [facility] 11/22/2016 10:06 AM Cell [phone] 8:00-4:00 Monday-Friday Office [phone]

## 2016-11-04 DEATH — deceased

## 2016-11-05 ENCOUNTER — Inpatient Hospital Stay (HOSPITAL_COMMUNITY): Payer: Medicaid Other

## 2016-11-05 DIAGNOSIS — J9621 Acute and chronic respiratory failure with hypoxia: Secondary | ICD-10-CM

## 2016-11-05 LAB — CBC
HCT: 29.3 % — ABNORMAL LOW (ref 39.0–52.0)
Hemoglobin: 9.2 g/dL — ABNORMAL LOW (ref 13.0–17.0)
MCH: 29.7 pg (ref 26.0–34.0)
MCHC: 31.4 g/dL (ref 30.0–36.0)
MCV: 94.5 fL (ref 78.0–100.0)
Platelets: 133 10*3/uL — ABNORMAL LOW (ref 150–400)
RBC: 3.1 MIL/uL — ABNORMAL LOW (ref 4.22–5.81)
RDW: 17.5 % — AB (ref 11.5–15.5)
WBC: 8.4 10*3/uL (ref 4.0–10.5)

## 2016-11-05 LAB — BASIC METABOLIC PANEL
ANION GAP: 8 (ref 5–15)
BUN: 23 mg/dL — AB (ref 6–20)
CALCIUM: 7.7 mg/dL — AB (ref 8.9–10.3)
CO2: 31 mmol/L (ref 22–32)
Chloride: 111 mmol/L (ref 101–111)
Creatinine, Ser: 0.81 mg/dL (ref 0.61–1.24)
GFR calc Af Amer: >60 mL/min (ref >60)
GLUCOSE: 113 mg/dL — AB (ref 65–99)
Potassium: 3.4 mmol/L — ABNORMAL LOW (ref 3.5–5.1)
SODIUM: 150 mmol/L — AB (ref 135–145)

## 2016-11-05 LAB — GLUCOSE, CAPILLARY: Glucose-Capillary: 87 mg/dL (ref 65–99)

## 2016-11-05 LAB — PHOSPHORUS: PHOSPHORUS: 4.2 mg/dL (ref 2.5–4.6)

## 2016-11-05 LAB — MAGNESIUM: MAGNESIUM: 1.9 mg/dL (ref 1.7–2.4)

## 2016-11-05 MED ORDER — POTASSIUM CHLORIDE 20 MEQ/15ML (10%) PO SOLN
20.0000 meq | ORAL | Status: AC
  Administered 2016-11-05 (×2): 20 meq
  Filled 2016-11-05 (×2): qty 15

## 2016-11-05 MED ORDER — IPRATROPIUM-ALBUTEROL 0.5-2.5 (3) MG/3ML IN SOLN
3.0000 mL | Freq: Four times a day (QID) | RESPIRATORY_TRACT | Status: DC
  Administered 2016-11-05 – 2016-11-12 (×27): 3 mL via RESPIRATORY_TRACT
  Filled 2016-11-05 (×28): qty 3

## 2016-11-05 MED ORDER — HYDROCORTISONE NA SUCCINATE PF 100 MG IJ SOLR
50.0000 mg | Freq: Two times a day (BID) | INTRAMUSCULAR | Status: AC
  Administered 2016-11-05 – 2016-11-06 (×2): 50 mg via INTRAVENOUS
  Filled 2016-11-05 (×2): qty 2

## 2016-11-05 MED ORDER — BUDESONIDE 0.5 MG/2ML IN SUSP
0.5000 mg | Freq: Two times a day (BID) | RESPIRATORY_TRACT | Status: DC
  Administered 2016-11-05 – 2016-12-16 (×82): 0.5 mg via RESPIRATORY_TRACT
  Filled 2016-11-05 (×82): qty 2

## 2016-11-05 NOTE — Progress Notes (Signed)
[facility] FOR AM LAB REPLACEMENT ONLY  The patient does apply for the [facility] Adult [facility] Electrolyte Replacment Protocol based on the criteria listed below:   1. Is GFR >/= 40 ml/min? Yes.    Patient's GFR today is >60 2. Is urine output >/= 0.5 ml/kg/hr for the last 6 hours? Yes.   Patient's UOP is 0.68 ml/kg/hr 3. Is BUN < 60 mg/dL? Yes.    Patient's BUN today is 23 4. Abnormal electrolyte(s):  K - 3.4 5. Ordered repletion with: PER PROTOCOL 6. If a panic level lab has been reported, has the CCM MD in charge been notified? Yes.  .   Physician:  Dr. Kenyon AnaNestor  Dale [name] 11/05/2016 6:27 AM

## 2016-11-05 NOTE — Progress Notes (Signed)
PULMONARY / CRITICAL CARE MEDICINE   Name: Dale [name] D Brickner MRN: [id number] DOB: 02/14/1966    ADMISSION DATE:  10/23/2016  CHIEF COMPLAINT:  Nausea and Vomiting  HISTORY OF PRESENT ILLNESS:   [name] is non-verbal, and history is obtained from the chart. Brief history from Dr. Kendrick FriesMcQuaid 07/12/16: Dale RippleCraig [name] is an 50 y.o. M with PMH as outlined below including admission in March 2017 with strep pneumo meningitis c/b cardiac arrest and cardiogenic shock, ARDS requiring prolonged intubation and trach (now decannulated), and acute renal failure.  [facility] through 07/11/16 for recurrent hypoglycemia due to adrenal insufficiency, new subacute CVA, seizures with inability to protect airway s/p intubation 7/13 through 7/21.  He re-presented 8/08 again with sepsis from UTI, and was intubated due to AMS, and was trached 8/18, and was discharged with a cuffless trach. That hospitalization was c/b MRSA bacteremia 8/24, and completed 3 weeks of IV vanc. He also developed C. Diff, and was treated with PO vanc through 9/11. He has had continued autonomic dysreflexia, which worsens during complications of acute illnesses.  He was decannulated / pulled out trach last time. He was brought to the ED with decreased level of consciousness and nausea/vomiting/diarrhea. A CXR showed consolidation of the left upper lobe. He was intubated and [facility].   SUBJECTIVE: Tolerated trache per ENT 12/1 Off diltiazem and neo drips since 12/1.  Comfortable. Sedated.    VITAL SIGNS: BP 105/71 (BP Location: Right Arm)   Pulse 71   Temp 97 F (36.1 C) (Core (Comment))   Resp 20   Ht 5\' 8"  (1.727 m)   Wt 74.7 kg (164 lb 10.9 oz)   SpO2 95%   BMI 25.04 kg/m   HEMODYNAMICS:    VENTILATOR SETTINGS: Vent Mode: PRVC FiO2 (%):  [40 %] 40 % Set Rate:  [20 bmp] 20 bmp Vt Set:  [410 mL] 410 mL PEEP:  [5 cmH20] 5 cmH20 Plateau Pressure:  [17 cmH20-20 cmH20] 18 cmH20  INTAKE / OUTPUT: I/O last  3 completed shifts: In: 3203.6 [I.V.:782.4; Other:75; NG/GT:2346.3] Out: 1810 [Urine:1810]  PHYSICAL EXAMINATION: General: Middle aged male , chr ill appearing Neuro: Non-responsive, sedated HEENT: [place]/AT. PERRL, sclerae anicteric. Cardiovascular: RRR, no M/R/G.  Lungs: Respirations even and unlabored.  scattered rhonchi, more on L lung Abdomen: BS x 4, soft, NT/ND.  Musculoskeletal: No gross deformities, no edema.  Skin: Intact, warm, no rashes.  LABS:  BMET  Recent Labs Lab 11/03/16 0420 Jul 10, 2016 0415 11/05/16 0400  NA 149* 149* 150*  K 3.7 3.7 3.4*  CL 112* 109 111  CO2 29 29 31   BUN 20 20 23*  CREATININE 0.78 0.75 0.81  GLUCOSE 98 98 113*    Electrolytes  Recent Labs Lab 11/03/16 0420 Jul 10, 2016 0415 11/05/16 0400  CALCIUM 8.0* 8.0* 7.7*  MG 2.0 1.9 1.9  PHOS 2.2* 4.3 4.2    CBC  Recent Labs Lab 11/03/16 0420 Jul 10, 2016 0415 11/05/16 0400  WBC 8.9 8.3 8.4  HGB 8.2* 8.4* 9.2*  HCT 26.4* 27.0* 29.3*  PLT 101* 98* 133*    Coag's No results for input(s): APTT, INR in the last 168 hours.  Sepsis Markers  Recent Labs Lab 11/01/2016 1841 10/29/2016 2336 10/31/16 0336  LATICACIDVEN 5.38* 5.2* 4.0*    ABG  Recent Labs Lab 10/24/2016 1650 10/08/2016 1940 10/21/2016 2359  PHART 7.378 7.466* 7.416  PCO2ART 51.5* 40.0 45.8  PO2ART 63.6* 67.9* 84.8    Liver Enzymes  Recent Labs Lab  10/17/2016 1544  AST 75*  ALT 43  ALKPHOS 51  BILITOT 0.8  ALBUMIN 2.0*    Cardiac Enzymes No results for input(s): TROPONINI, PROBNP in the last 168 hours.  Glucose  Recent Labs Lab 10/31/16 0441 10/31/16 0831 10/31/16 1231 10/31/16 1716 11/21/2016 1635 12/02/2016 2018  GLUCAP 109* 97 138* 128* 87 95    Imaging No results found.   STUDIES:  CXR 11/26 >> LUL PNA  CULTURES: Blood 11/26 >> coag neg staph 1/2  C. Diff 11/26 >> (-) resp 11/27 >>  Enterobacter cloacae  ANTIBIOTICS: Vanc 11/26 >>11/30 Pip-tazo 11/26 >>12/1 Cipro 12/1 >   SIGNIFICANT  EVENTS: Intubated (endotracheally) at [facility] ED 11/26 >> 11/29 bronch - left inspissated pus  LINES/TUBES: PEG PICC 11/27  RUE >> Foley Trache 12/1   DISCUSSION: 50 y/o man s/p devastating meningitis p/w fever, AMS & HCAP vs aspiration, CXR showing left white out now  ASSESSMENT / PLAN:  PULMONARY A: Acute resp failure/ ARDS/ L HCAP with Enterobacter cloacae Hx of trach x2. Trached for the 3rd time on 12/1 Left hemithorax opacification 11/29 P:   ATC as tolerated. NO Decannulation this time.  Start neb meds.    CARDIOVASCULAR A:  Chronic CHF -EF 40% 07/2016 Hx of autonomic dysreflexia 11/28 transient AF - converted to nSR P:  Off Neo gtt Stress-dose hydocort > wean off Lopressor 5 q4 prn Ct cardizem gtt to target rate control -transition to oral post op if needed  RENAL A:   Hypokalemia Hypernatremia  hypophos Acute kidney injury -resolved P:   Lytes as needed ct free water in PEG   GASTROINTESTINAL A:   Need for TF Hypoalbuminemia P:   ct TF post op   HEMATOLOGIC A:   Anemia P:  Likely 2/2 chronic disease  INFECTIOUS A:   Severe Sepsis, Lt HCAP - Enterobacter cloacae. Cdiff (-) Hx of MRSA bactermia Hx of HCAP with Enterobacter >> MDR. P:   On ciprofloxacin c diff (-) > will observe for now.   ENDOCRINE A:   Adrenal insufficency - on 100 mg hydrocort daily P:   Will keep on hydrocortisone at 100 mg/d (baseline dose)  NEUROLOGIC A:   Devastating CNS injury in March '17 Acute encephalopathy. Hx seizures, strep pneumo meningitis, CVA. P:   Continue depakote, keppra  Ct  Seroquel & clonazepam  Daily WUA  Prn meds  FAMILY  - Updates: wife shirley 11/29, agreeable to trach , agreeable to palliative conversation 'after the holidays' - will involve. May need LTAC.   - Inter-disciplinary family meet or Palliative Care meeting due by: 12/3, has been resistant to these conversations in the past  The patient is critically ill with multiple  organ systems failure and requires high complexity decision making for assessment and support, frequent evaluation and titration of therapies, application of advanced monitoring technologies and extensive interpretation of multiple databases. Critical Care Time devoted to patient care services described in this note independent of APP time is 35 minutes.   Critical care time spent on this pt : 30 minutes.   Pollie MeyerJ. Angelo A de Dios, MD 11/05/2016, 8:30 AM [facility] Pager [phone] After 3 pm or if no answer, call [phone]

## 2016-11-05 NOTE — Op Note (Signed)
NAMThomasene Ripple:  Danish, Rett                 ACCOUNT NO.:  [id number]  MEDICAL RECORD NO.:  [id number]  LOCATION:  [id number]                        FACILITY:  [facility]  PHYSICIAN:  Kinnie Scalesavid L. Annalee GentaShoemaker, M.D.DATE OF BIRTH:  10-28-1966  DATE OF PROCEDURE:  11/20/2016 DATE OF DISCHARGE:                              OPERATIVE REPORT   PREOPERATIVE DIAGNOSIS: 1. Chronic ventilatory dependent respiratory failure. 2. Status post previous tracheostomy.  POSTOPERATIVE DIAGNOSIS: 1. Chronic ventilatory dependent respiratory failure. 2. Status post previous tracheostomy.  INDICATION FOR SURGERY: 1. Chronic ventilatory dependent respiratory failure. 2. Status post previous tracheostomy.  PROCEDURE:  Revision tracheostomy.  SURGEON:  Kinnie Scalesavid L. Annalee GentaShoemaker, MD  COMPLICATIONS:  None.  ESTIMATED BLOOD LOSS:  Minimal.  ANESTHESIA:  General endotracheal.  DISPOSITION:  The patient transferred from the operating room to unit 4100 in stable condition.  BRIEF HISTORY:  The patient is a 50 year old male with a significant medical history including chronic renal dysfunction and chronic pulmonary dysfunction.  [facility], after suffering a seizure.  He required prolonged ventilatory support and tracheostomy was performed.  The patient was stable, discharged from the hospital to a long-term care facility.  He was later decannulated and his tracheostomy site healed completely.  He returned to the hospital on October 30, 2016, with progressive respiratory dysfunction and shortness of breath.  He was reintubated and required prolonged ventilatory support.  ENT Service was consulted for revision tracheostomy.  Risks and benefits of procedure were discussed with the patient's family.  Consent was obtained.  DESCRIPTION OF PROCEDURE:  The patient was brought to the operating room on November 04, 2016, placed in supine position on the operating table. General endotracheal  anesthesia was established without difficulty. When patient was adequately anesthetized, he was positioned and prepped and draped.  A surgical time-out was performed with correct identification of the patient and the surgical procedure.  He was then injected with 4 mL of 1% lidocaine with 1:100,000 dilution epinephrine which was injected in subcutaneous fashion.  The patient was prepped, draped, and prepared.  With the patient prepared for surgery, he was positioned and prepped and draped in sterile fashion.  His anterior neck was carefully palpated. Previous tracheostomy scar was delineated using a #15 scalpel.  Scar tissue was resected through the skin underlying subcutaneous tissue, then using Bovie electrocautery, dissecting along the fibrous tract of his previous tracheostomy site.  Dissection was carried to the level of the anterior tracheal wall.  Soft tissue was excised and the trachea was entered.  The patient's endotracheal tube was carefully withdrawn. Airway was suctioned.  A #6 Shiley tracheostomy tube was inserted without difficulty and sutured to the skin.  There was excellent gas exchange and ventilation.  The tube was sutured with interrupted 3-0 Ethilon suture and a Velcro trach tie was placed.  The patient was then awakened from his anesthetic and he was transferred from the operating room to unit 4100, in stable condition.  There were no complications and minimal blood loss.          ______________________________ Kinnie Scalesavid L. Annalee GentaShoemaker, M.D.     DLS/MEDQ  D:  [id number]/12/[id number]  T:  11/05/2016  Job:  [id number]

## 2016-11-05 NOTE — Progress Notes (Signed)
Placed patient back on vent PSV 5/5 and 40% due to tachypnea and SATS of 85% on 30% ATC.

## 2016-11-05 NOTE — Progress Notes (Signed)
Placed patient on 28% ATC patient tolerated well SATS 94% RR 16, HR 81 BPM, and BP 106/74, will continue to monitor patient RN aware.

## 2016-11-06 LAB — BASIC METABOLIC PANEL
ANION GAP: 11 (ref 5–15)
BUN: 17 mg/dL (ref 6–20)
CHLORIDE: 108 mmol/L (ref 101–111)
CO2: 29 mmol/L (ref 22–32)
Calcium: 7.8 mg/dL — ABNORMAL LOW (ref 8.9–10.3)
Creatinine, Ser: 0.75 mg/dL (ref 0.61–1.24)
GFR calc non Af Amer: >60 mL/min (ref >60)
Glucose, Bld: 88 mg/dL (ref 65–99)
POTASSIUM: 3.3 mmol/L — AB (ref 3.5–5.1)
SODIUM: 148 mmol/L — AB (ref 135–145)

## 2016-11-06 LAB — CBC
HCT: 27.3 % — ABNORMAL LOW (ref 39.0–52.0)
HEMOGLOBIN: 8.6 g/dL — AB (ref 13.0–17.0)
MCH: 29.8 pg (ref 26.0–34.0)
MCHC: 31.5 g/dL (ref 30.0–36.0)
MCV: 94.5 fL (ref 78.0–100.0)
Platelets: 160 10*3/uL (ref 150–400)
RBC: 2.89 MIL/uL — AB (ref 4.22–5.81)
RDW: 17.6 % — ABNORMAL HIGH (ref 11.5–15.5)
WBC: 12.8 10*3/uL — AB (ref 4.0–10.5)

## 2016-11-06 LAB — MAGNESIUM: Magnesium: 1.8 mg/dL (ref 1.7–2.4)

## 2016-11-06 LAB — PHOSPHORUS: Phosphorus: 4.1 mg/dL (ref 2.5–4.6)

## 2016-11-06 MED ORDER — HYDROCORTISONE 20 MG PO TABS
100.0000 mg | ORAL_TABLET | Freq: Two times a day (BID) | ORAL | Status: DC
  Administered 2016-11-06 – 2016-11-07 (×2): 100 mg
  Filled 2016-11-06 (×2): qty 5

## 2016-11-06 MED ORDER — POTASSIUM CHLORIDE 20 MEQ/15ML (10%) PO SOLN
40.0000 meq | Freq: Once | ORAL | Status: AC
  Administered 2016-11-06: 40 meq
  Filled 2016-11-06: qty 30

## 2016-11-06 MED ORDER — POTASSIUM CHLORIDE 20 MEQ/15ML (10%) PO SOLN
20.0000 meq | ORAL | Status: AC
  Administered 2016-11-06 (×2): 20 meq
  Filled 2016-11-06 (×2): qty 15

## 2016-11-06 NOTE — Progress Notes (Addendum)
PULMONARY / CRITICAL CARE MEDICINE   Name: Dale [name] MRN: [id number] DOB: 09/13/1966    ADMISSION DATE:  November 12, 2016  CHIEF COMPLAINT:  Nausea and Vomiting  HISTORY OF PRESENT ILLNESS:   Dale [name] is non-verbal, and history is obtained from the chart. Brief history from Dr. Kendrick FriesMcQuaid 07/12/16: Dale [name] is an 50 y.o. M with PMH as outlined below including admission in March 2017 with strep pneumo meningitis c/b cardiac arrest and cardiogenic shock, ARDS requiring prolonged intubation and trach (now decannulated), and acute renal failure.  [facility] through 07/11/16 for recurrent hypoglycemia due to adrenal insufficiency, new subacute CVA, seizures with inability to protect airway s/p intubation 7/13 through 7/21.  He re-presented 8/08 again with sepsis from UTI, and was intubated due to AMS, and was trached 8/18, and was discharged with a cuffless trach. That hospitalization was c/b MRSA bacteremia 8/24, and completed 3 weeks of IV vanc. He also developed C. Diff, and was treated with PO vanc through 9/11. He has had continued autonomic dysreflexia, which worsens during complications of acute illnesses.  He was decannulated / pulled out trach last time. He was brought to the ED with decreased level of consciousness and nausea/vomiting/diarrhea. A CXR showed consolidation of the left upper lobe. [facility] was intubated and [facility]   SUBJECTIVE: Did 5 hrs of ATC on 12/2 and became tachypneic.  On the vent right now.   VITAL SIGNS: BP 108/72   Pulse 93   Temp 100 F (37.8 C) (Oral)   Resp 18   Ht 5\' 8"  (1.727 m)   Wt 74.1 kg (163 lb 5.8 oz)   SpO2 96%   BMI 24.84 kg/m   HEMODYNAMICS:    VENTILATOR SETTINGS: Vent Mode: PRVC FiO2 (%):  [28 %-40 %] 40 % Set Rate:  [20 bmp] 20 bmp Vt Set:  [410 mL] 410 mL PEEP:  [5 cmH20] 5 cmH20 Pressure Support:  [5 cmH20] 5 cmH20 Plateau Pressure:  [17 cmH20] 17 cmH20  INTAKE / OUTPUT: I/O last 3 completed shifts: In:  3941.7 [I.V.:313.7; Other:150; NG/GT:3478] Out: 2500 [Urine:2500]  PHYSICAL EXAMINATION: General: Middle aged male , chr ill appearing Neuro: CN grossly intact. (-) lateralizing signs.  Opens eyes to commands.  HEENT: [place]/AT. PERRL, sclerae anicteric. Cardiovascular: RRR, no M/R/G.  Lungs: Respirations even and unlabored.  scattered rhonchi, more on L lung Abdomen: BS x 4, soft, NT/ND.  Musculoskeletal: No gross deformities, no edema.  Skin: Intact, warm, no rashes.  LABS:  BMET  Recent Labs Lab 11/11/2016 0415 11/05/16 0400 11/06/16 0320  NA 149* 150* 148*  K 3.7 3.4* 3.3*  CL 109 111 108  CO2 29 31 29   BUN 20 23* 17  CREATININE 0.75 0.81 0.75  GLUCOSE 98 113* 88    Electrolytes  Recent Labs Lab 11/11/2016 0415 11/05/16 0400 11/06/16 0320  CALCIUM 8.0* 7.7* 7.8*  MG 1.9 1.9 1.8  PHOS 4.3 4.2 4.1    CBC  Recent Labs Lab 11/30/2016 0415 11/05/16 0400 11/06/16 0320  WBC 8.3 8.4 12.8*  HGB 8.4* 9.2* 8.6*  HCT 27.0* 29.3* 27.3*  PLT 98* 133* 160    Coag's No results for input(s): APTT, INR in the last 168 hours.  Sepsis Markers  Recent Labs Lab December 17, 2015 1841 December 17, 2015 2336 10/31/16 0336  LATICACIDVEN 5.38* 5.2* 4.0*    ABG  Recent Labs Lab December 17, 2015 1650 December 17, 2015 1940 December 17, 2015 2359  PHART 7.378 7.466* 7.416  PCO2ART 51.5* 40.0 45.8  PO2ART  63.6* 67.9* 84.8    Liver Enzymes  Recent Labs Lab 11/01/2016 1544  AST 75*  ALT 43  ALKPHOS 51  BILITOT 0.8  ALBUMIN 2.0*    Cardiac Enzymes No results for input(s): TROPONINI, PROBNP in the last 168 hours.  Glucose  Recent Labs Lab 10/31/16 0441 10/31/16 0831 10/31/16 1231 10/31/16 1716 12/04/2016 1635 12/01/2016 2018  GLUCAP 109* 97 138* 128* 87 95    Imaging No results found.   STUDIES:  CXR 11/26 >> LUL PNA  CULTURES: Blood 11/26 >> coag neg staph 1/2  C. Diff 11/26 >> (-) resp 11/27 >>  Enterobacter cloacae  ANTIBIOTICS: Vanc 11/26 >>11/30 Pip-tazo 11/26 >>12/1 Cipro 12/1  >   SIGNIFICANT EVENTS: Intubated (endotracheally) at [facility] ED 11/26 >> 11/29 bronch - left inspissated pus  LINES/TUBES: PEG PICC 11/27  RUE >> Foley Trache 12/1   DISCUSSION: 50 y/o man s/p devastating meningitis p/w fever, AMS & HCAP vs aspiration, CXR showing left white out now  ASSESSMENT / PLAN:  PULMONARY A: Acute resp failure/ ARDS/ L HCAP with Enterobacter cloacae Hx of trach x2. Trached for the 3rd time on 12/1 Left hemithorax opacification 11/29 P:   ATC as tolerated. NO Decannulation this time.  cont neb meds.  Needs LTAC  CARDIOVASCULAR A:  Chronic CHF -EF 40% 07/2016 Hx of autonomic dysreflexia 11/28 transient AF - converted to nSR P:  Off Neo gtt Stress-dose hydocort > wean off Lopressor 5 q4 prn Cardizem has been weaned off  RENAL A:   Hypokalemia Hypernatremia  hypophos Acute kidney injury -resolved P:   Lytes as needed ct free water in PEG > once Na is better, plan to d/c free water.    GASTROINTESTINAL A:   Need for TF Hypoalbuminemia P:   ct TF post op   HEMATOLOGIC A:   Anemia P:  Likely 2/2 chronic disease  INFECTIOUS A:   Severe Sepsis, Lt HCAP - Enterobacter cloacae. Cdiff (-) Hx of MRSA bactermia Hx of HCAP with Enterobacter >> MDR. P:   On ciprofloxacin c diff (-) > will observe for now.   ENDOCRINE A:   Adrenal insufficency. Chart review with Pharmacy 12/3 : pt allegedly on HC 100 mg TID and Florinef 0.1 mg daily P:   Will keep on hydrocortisone at 100 mg BID per PEG for now.   NEUROLOGIC A:   Devastating CNS injury in March '17 Acute encephalopathy. Hx seizures, strep pneumo meningitis, CVA. P:   Continue depakote, keppra  Ct  Seroquel & clonazepam  Daily WUA  Prn meds  FAMILY  - Updates: wife shirley 11/29, agreeable to trach , agreeable to palliative conversation 'after the holidays' - will involve. Will need LTAC. No family 12/3.   - Inter-disciplinary family meet or Palliative Care meeting due  by: 12/3, has been resistant to these conversations in the past  The patient is critically ill with multiple organ systems failure and requires high complexity decision making for assessment and support, frequent evaluation and titration of therapies, application of advanced monitoring technologies and extensive interpretation of multiple databases. Critical Care Time devoted to patient care services described in this note independent of APP time is 35 minutes.   Critical care time spent on this pt : 30 minutes.   Pollie MeyerJ. Angelo A de Dios, MD 11/06/2016, 9:34 AM [facility] Pager [phone] After 3 pm or if no answer, call [phone]

## 2016-11-06 NOTE — Progress Notes (Signed)
[facility] FOR AM LAB REPLACEMENT ONLY  The patient does [facility] based on the criteria listed below:   1. Is GFR >/= 40 ml/min? Yes.    Patient's GFR today is >60 2. Is urine output >/= 0.5 ml/kg/hr for the last 6 hours? Yes.   Patient's UOP is 1.1 ml/kg/hr 3. Is BUN < 60 mg/dL? Yes.    Patient's BUN today is 17 4. Abnormal electrolyte(s):  K - 3.3 5. Ordered repletion with: PER PROTOCOL 6. If a panic level lab has been reported, has the CCM MD in charge been notified? Yes.  .   Physician:  Dr. Kenyon AnaNestor  Jerika Wales C Neilson Oehlert 11/06/2016 4:20 AM

## 2016-11-06 NOTE — Progress Notes (Signed)
Vent om standby patient on 40% ATC tolerating well SATS 95% RR 12, HR 97, BP 103/79, will continue to monitor patient.

## 2016-11-06 NOTE — Progress Notes (Signed)
Placed vent and standby, and placed patient on 35% ATC per MD's request patient tolerated well with SATS of 96% RR 14, HR 101, BP 108/72, will continue to monitor patient.

## 2016-11-07 ENCOUNTER — Encounter (HOSPITAL_COMMUNITY): Payer: Self-pay | Admitting: Otolaryngology

## 2016-11-07 DIAGNOSIS — G9349 Other encephalopathy: Secondary | ICD-10-CM

## 2016-11-07 LAB — CBC
HCT: 28 % — ABNORMAL LOW (ref 39.0–52.0)
Hemoglobin: 8.8 g/dL — ABNORMAL LOW (ref 13.0–17.0)
MCH: 29.9 pg (ref 26.0–34.0)
MCHC: 31.4 g/dL (ref 30.0–36.0)
MCV: 95.2 fL (ref 78.0–100.0)
PLATELETS: 177 10*3/uL (ref 150–400)
RBC: 2.94 MIL/uL — ABNORMAL LOW (ref 4.22–5.81)
RDW: 17.9 % — AB (ref 11.5–15.5)
WBC: 13.1 10*3/uL — ABNORMAL HIGH (ref 4.0–10.5)

## 2016-11-07 LAB — BASIC METABOLIC PANEL
Anion gap: 9 (ref 5–15)
BUN: 17 mg/dL (ref 6–20)
CALCIUM: 7.9 mg/dL — AB (ref 8.9–10.3)
CO2: 29 mmol/L (ref 22–32)
CREATININE: 0.83 mg/dL (ref 0.61–1.24)
Chloride: 108 mmol/L (ref 101–111)
GFR calc Af Amer: >60 mL/min (ref >60)
GLUCOSE: 88 mg/dL (ref 65–99)
Potassium: 4.1 mmol/L (ref 3.5–5.1)
Sodium: 146 mmol/L — ABNORMAL HIGH (ref 135–145)

## 2016-11-07 LAB — MAGNESIUM: MAGNESIUM: 1.8 mg/dL (ref 1.7–2.4)

## 2016-11-07 LAB — PHOSPHORUS: PHOSPHORUS: 4.4 mg/dL (ref 2.5–4.6)

## 2016-11-07 MED ORDER — FLUDROCORTISONE 0.1 MG/ML ORAL SUSPENSION: 0.1000 mg | Freq: Every day | ORAL | Status: DC

## 2016-11-07 MED ORDER — HYDROCORTISONE 0.5 MG/ML ORAL SUSPENSION: 5.0000 mg | Freq: Two times a day (BID) | ORAL | Status: DC

## 2016-11-07 MED ORDER — HYDROCORTISONE 5 MG PO TABS
5.0000 mg | ORAL_TABLET | Freq: Two times a day (BID) | ORAL | Status: DC
  Administered 2016-11-07 – 2016-11-08 (×2): 5 mg
  Filled 2016-11-07 (×3): qty 1

## 2016-11-07 MED ORDER — FLUDROCORTISONE ACETATE 0.1 MG PO TABS
0.1000 mg | ORAL_TABLET | Freq: Every day | ORAL | Status: DC
  Administered 2016-11-07 – 2016-12-16 (×40): 0.1 mg
  Filled 2016-11-07 (×41): qty 1

## 2016-11-07 NOTE — Progress Notes (Signed)
RT note:  Upon entering room monitor began alarming for low respiratory rate.  Patient found resting with RR 4-5 bpm.  SpO2 100%.  D/W RN who states that they needed to give him sedation post bath due to agitation.  Due to the above, patient was placed back on the ventilator for the night.  Tolerating well at this time.  RT will continue to monitor.

## 2016-11-07 NOTE — Progress Notes (Signed)
Pt's wife shirley called and made aware pt was moving to 4E18.

## 2016-11-07 NOTE — Progress Notes (Signed)
PULMONARY / CRITICAL CARE MEDICINE   Name: Dale [name] MRN: [id number] DOB: 03/10/1966    ADMISSION DATE:  10/27/2016  CHIEF COMPLAINT:  Nausea and Vomiting  HISTORY OF PRESENT ILLNESS:   Dale [name] is non-verbal, and history is obtained from the chart. Brief history from Dr. Kendrick FriesMcQuaid 07/12/16: Dale [name] is an 50 y.o. M with PMH as outlined below including admission in March 2017 with strep pneumo meningitis c/b cardiac arrest and cardiogenic shock, ARDS requiring prolonged intubation and trach (now decannulated), and acute renal failure.  [facility] through 07/11/16 for recurrent hypoglycemia due to adrenal insufficiency, new subacute CVA, seizures with inability to protect airway s/p intubation 7/13 through 7/21.  He re-presented 8/08 again with sepsis from UTI, and was intubated due to AMS, and was trached 8/18, and was discharged with a cuffless trach. That hospitalization was c/b MRSA bacteremia 8/24, and completed 3 weeks of IV vanc. He also developed C. Diff, and was treated with PO vanc through 9/11. He has had continued autonomic dysreflexia, which worsens during complications of acute illnesses.  He was decannulated / pulled out trach last time. He was brought to the ED with decreased level of consciousness and nausea/vomiting/diarrhea. A CXR showed consolidation of the left upper lobe. [facility] was intubated and [facility]   SUBJECTIVE: Has been tolerating ATC all am today, was on MV overnight last night.   VITAL SIGNS: BP 122/82   Pulse 87   Temp 98.4 F (36.9 C) (Core (Comment))   Resp 10   Ht 5\' 8"  (1.727 m)   Wt 74.8 kg (164 lb 14.5 oz)   SpO2 92%   BMI 25.07 kg/m   HEMODYNAMICS:    VENTILATOR SETTINGS: Vent Mode: PRVC FiO2 (%):  [40 %] 40 % Set Rate:  [20 bmp] 20 bmp Vt Set:  [410 mL] 410 mL PEEP:  [5 cmH20] 5 cmH20 Plateau Pressure:  [15 cmH20-17 cmH20] 16 cmH20  INTAKE / OUTPUT: I/O last 3 completed shifts: In: 3191.7 [I.V.:253.7;  Other:60; NG/GT:2878] Out: 2115 [Urine:2115]  PHYSICAL EXAMINATION: General: Middle aged male , chr ill appearing Neuro: CN grossly intact. (-) lateralizing signs.  Opens eyes to commands.  HEENT: [place]/AT. PERRL, sclerae anicteric. Cardiovascular: RRR, no M/R/G.  Lungs: Respirations even and unlabored.  scattered rhonchi, more on L lung Abdomen: BS x 4, soft, NT/ND.  Musculoskeletal: No gross deformities, no edema.  Skin: Intact, warm, no rashes.  LABS:  BMET  Recent Labs Lab 11/05/16 0400 11/06/16 0320 11/07/16 0402  NA 150* 148* 146*  K 3.4* 3.3* 4.1  CL 111 108 108  CO2 31 29 29   BUN 23* 17 17  CREATININE 0.81 0.75 0.83  GLUCOSE 113* 88 88    Electrolytes  Recent Labs Lab 11/05/16 0400 11/06/16 0320 11/07/16 0402  CALCIUM 7.7* 7.8* 7.9*  MG 1.9 1.8 1.8  PHOS 4.2 4.1 4.4    CBC  Recent Labs Lab 11/05/16 0400 11/06/16 0320 11/07/16 0402  WBC 8.4 12.8* 13.1*  HGB 9.2* 8.6* 8.8*  HCT 29.3* 27.3* 28.0*  PLT 133* 160 177    Coag's No results for input(s): APTT, INR in the last 168 hours.  Sepsis Markers No results for input(s): LATICACIDVEN, PROCALCITON, O2SATVEN in the last 168 hours.  ABG No results for input(s): PHART, PCO2ART, PO2ART in the last 168 hours.  Liver Enzymes No results for input(s): AST, ALT, ALKPHOS, BILITOT, ALBUMIN in the last 168 hours.  Cardiac Enzymes No results for input(s):  TROPONINI, PROBNP in the last 168 hours.  Glucose  Recent Labs Lab 10/31/16 1231 10/31/16 1716 11/17/2016 1635 11/10/2016 2018  GLUCAP 138* 128* 87 95    Imaging No results found.   STUDIES:  CXR 11/26 >> LUL PNA  CULTURES: Blood 11/26 >> coag neg staph 1/2  C. Diff 11/26 >> (-) resp 11/27 >>  Enterobacter cloacae  ANTIBIOTICS: Vanc 11/26 >>11/30 Pip-tazo 11/26 >>12/1 Cipro 12/1 >   SIGNIFICANT EVENTS: Intubated (endotracheally) at OSH ED 11/26 >> 11/29 bronch - left inspissated pus  LINES/TUBES: PEG PICC 11/27  RUE  >> Foley Trache 12/1   DISCUSSION: 50 y/o man s/p devastating meningitis, chronic debility, p/w fever, AMS & severe L HCAP vs aspiration  ASSESSMENT / PLAN:  PULMONARY A: Acute resp failure/ ARDS/ L HCAP with Enterobacter cloacae Hx of trach x2. Trached for the 3rd time on 12/1 Left hemithorax opacification 11/29 P:   ATC as tolerated. NO Decannulation this time for airway protection and to facilitate MV if / when he decompensates in the future.  cont neb meds.  Needs LTAC  CARDIOVASCULAR A:  Chronic CHF -EF 40% 07/2016 Hx of autonomic dysreflexia 11/28 transient AF - converted to nSR P:  Off Neo gtt Stress-dose hydocort > stop 12/4 Lopressor 5 q4 prn Cardizem off  RENAL A:   Hypokalemia Hypernatremia  hypophos Acute kidney injury -resolved P:   Lytes as needed ct free water in PEG   GASTROINTESTINAL A:   Need for TF Hypoalbuminemia P:   ct TF    HEMATOLOGIC A:   Anemia P:  Likely 2/2 chronic disease  INFECTIOUS A:   Severe Sepsis, Lt HCAP - Enterobacter cloacae. Cdiff (-) Hx of MRSA bactermia Hx of HCAP with Enterobacter >> MDR. P:   On ciprofloxacin c diff (-) > will observe for now.   ENDOCRINE A:   Adrenal insufficency. Chart review with Pharmacy 12/3 : pt allegedly on HC 100 mg TID and Florinef 0.1 mg daily P:   Changed hydrocort to 5mg  bid, added florinef 12/4  NEUROLOGIC A:   Devastating CNS injury in March '17 Acute on chronic encephalopathy. Hx seizures, strep pneumo meningitis, CVA. P:   Continue depakote, keppra  Ct  Seroquel & clonazepam  Daily WUA  Prn meds  FAMILY  - Updates: wife shirley 11/29, agreeable to trach , agreeable to palliative conversation 'after the holidays' - will involve. Will need LTAC. No family 12/3 or 12/4  - Inter-disciplinary family meet or Palliative Care meeting due by: 12/3, has been resistant to these conversations in the past  The patient is critically ill with multiple organ systems  failure and requires high complexity decision making for assessment and support, frequent evaluation and titration of therapies, application of advanced monitoring technologies and extensive interpretation of multiple databases. Critical Care Time devoted to patient care services described in this note independent of APP time is 31 minutes.    Levy Pupaobert Logann Whitebread, MD, PhD 11/07/2016, 11:53 AM [facility] [phone] or if no answer [phone]

## 2016-11-07 NOTE — Progress Notes (Signed)
Pt will not be ltac candidate as he has medicaid ins and ltac's do not take medicaid. sw ref placed for poss vent snf if needed.

## 2016-11-07 NOTE — Clinical Social Work Note (Signed)
Clinical Social Work Assessment  Patient Details  Name: Dale [name] MRN: [id number] Date of Birth: 05/18/1966  Date of referral:  11/07/16               Reason for consult:  Discharge Planning ([facility]                Permission sought to share information with:  Case Production designer, theatre/television/filmManager, Oceanographeracility Contact Representative Permission granted to share information::     Name::        Agency::  [facility]  Relationship::     Contact Information:     Housing/Transportation Living arrangements for the past 2 months:  Skilled Building surveyorursing Facility Source of Information:  Medical Team, Sports coachCase Manager, Spouse Patient Interpreter Needed:  None Criminal Activity/Legal Involvement Pertinent to Current Situation/Hospitalization:  No - Comment as needed Significant Relationships:  Spouse Lives with:  Facility Resident Do you feel safe going back to the place where you live?  Yes Need for family participation in patient care:  Yes (Comment)  Care giving concerns:  [facility]  Spouse involved and working to establish goals of care. Currently vented and possible need for vent snf if unable to extubate   Social Worker assessment / plan:  LCSW has received first consult that patient was admitted from a facility: [facility].  Patient unable to return at this time due to vent placement.  Possible need for long term vent placement and LCSW following acutely to assist. Palliative Care consult has been placed and wife working with staff in effort to determine goals of care and disposition. LCSW is following acutely to recommendations and outcomes. Also available for emotional support and coping if warranted or requested.   Will continue to follow. Plan: TBD  Employment status:  Disabled (Comment on whether or not currently receiving Disability) Insurance information:  [place] PT Recommendations:  Not assessed at this time Information / Referral to community  resources:     Patient/Family's Response to care:  NA  Patient/Family's Understanding of and Emotional Response to Diagnosis, Current Treatment, and Prognosis:  NA  Emotional Assessment Appearance:  Developmentally appropriate Attitude/Demeanor/Rapport:  Sedated, Unresponsive (vented) Affect (typically observed):  Withdrawn, Stable Orientation:    Alcohol / Substance use:  Not Applicable Psych involvement (Current and /or in the community):  No (Comment)  Discharge Needs  Concerns to be addressed:  Adjustment to Illness, Coping/Stress Concerns, Care Coordination Readmission within the last 30 days:  Yes Current discharge risk:  Chronically ill Barriers to Discharge:  Continued Medical Work up   Dale [name], Dale Bergerson N, LCSW 11/07/2016, 12:15 PM

## 2016-11-08 ENCOUNTER — Inpatient Hospital Stay (HOSPITAL_COMMUNITY): Payer: Medicaid Other

## 2016-11-08 DIAGNOSIS — J156 Pneumonia due to other aerobic Gram-negative bacteria: Secondary | ICD-10-CM

## 2016-11-08 DIAGNOSIS — G9340 Encephalopathy, unspecified: Secondary | ICD-10-CM

## 2016-11-08 DIAGNOSIS — E870 Hyperosmolality and hypernatremia: Secondary | ICD-10-CM

## 2016-11-08 LAB — BASIC METABOLIC PANEL
Anion gap: 9 (ref 5–15)
BUN: 13 mg/dL (ref 6–20)
CHLORIDE: 106 mmol/L (ref 101–111)
CO2: 31 mmol/L (ref 22–32)
Calcium: 8 mg/dL — ABNORMAL LOW (ref 8.9–10.3)
Creatinine, Ser: 0.66 mg/dL (ref 0.61–1.24)
GFR calc Af Amer: >60 mL/min (ref >60)
GFR calc non Af Amer: >60 mL/min (ref >60)
GLUCOSE: 88 mg/dL (ref 65–99)
POTASSIUM: 2.9 mmol/L — AB (ref 3.5–5.1)
Sodium: 146 mmol/L — ABNORMAL HIGH (ref 135–145)

## 2016-11-08 LAB — CBC
HEMATOCRIT: 24.9 % — AB (ref 39.0–52.0)
Hemoglobin: 7.9 g/dL — ABNORMAL LOW (ref 13.0–17.0)
MCH: 29.9 pg (ref 26.0–34.0)
MCHC: 31.7 g/dL (ref 30.0–36.0)
MCV: 94.3 fL (ref 78.0–100.0)
Platelets: 193 10*3/uL (ref 150–400)
RBC: 2.64 MIL/uL — ABNORMAL LOW (ref 4.22–5.81)
RDW: 17 % — AB (ref 11.5–15.5)
WBC: 8.7 10*3/uL (ref 4.0–10.5)

## 2016-11-08 LAB — GLUCOSE, CAPILLARY: Glucose-Capillary: 76 mg/dL (ref 65–99)

## 2016-11-08 MED ORDER — CLONAZEPAM 0.5 MG PO TBDP
0.5000 mg | ORAL_TABLET | Freq: Four times a day (QID) | ORAL | Status: DC
  Administered 2016-11-08 – 2016-11-09 (×5): 0.5 mg
  Filled 2016-11-08 (×5): qty 1

## 2016-11-08 MED ORDER — QUETIAPINE FUMARATE 50 MG PO TABS
50.0000 mg | ORAL_TABLET | Freq: Two times a day (BID) | ORAL | Status: DC
Start: 2016-11-08 — End: 2016-11-09
  Administered 2016-11-08 (×2): 50 mg via ORAL
  Filled 2016-11-08 (×3): qty 1

## 2016-11-08 MED ORDER — CLONAZEPAM 0.1 MG/ML ORAL SUSPENSION: 0.5000 mg | Freq: Four times a day (QID) | ORAL | Status: DC

## 2016-11-08 MED ORDER — FREE WATER
300.0000 mL | Status: DC
Start: 2016-11-08 — End: 2016-11-09
  Administered 2016-11-08 – 2016-11-09 (×7): 300 mL

## 2016-11-08 MED ORDER — LORAZEPAM 2 MG/ML PO CONC
1.0000 mg | Freq: Four times a day (QID) | ORAL | Status: DC | PRN
  Administered 2016-11-08 – 2016-11-11 (×5): 1 mg via ORAL
  Filled 2016-11-08 (×5): qty 1

## 2016-11-08 MED ORDER — HYDROCORTISONE 20 MG PO TABS
20.0000 mg | ORAL_TABLET | Freq: Every day | ORAL | Status: DC
  Administered 2016-11-08 – 2016-11-13 (×6): 20 mg
  Filled 2016-11-08 (×6): qty 1

## 2016-11-08 MED ORDER — HYDROCORTISONE 10 MG PO TABS
10.0000 mg | ORAL_TABLET | Freq: Every day | ORAL | Status: DC
Start: 2016-11-09 — End: 2016-11-14
  Administered 2016-11-09 – 2016-11-14 (×6): 10 mg
  Filled 2016-11-08 (×6): qty 1

## 2016-11-08 MED ORDER — POTASSIUM CHLORIDE 20 MEQ/15ML (10%) PO SOLN
40.0000 meq | ORAL | Status: AC
  Administered 2016-11-08 (×2): 40 meq
  Filled 2016-11-08 (×2): qty 30

## 2016-11-08 NOTE — Progress Notes (Signed)
[facility] FOR AM LAB REPLACEMENT ONLY  The patient does [facility] the [facility] Protocol based on the criteria listed below:   1. Is GFR >/= 40 ml/min? Yes.    Patient's GFR today is >60 2. Is urine output >/= 0.5 ml/kg/hr for the last 6 hours? Yes.   Patient's UOP is 1.0 ml/kg/hr 3. Is BUN < 60 mg/dL? Yes.    Patient's BUN today is 13 4. Abnormal electrolyte(s):K 2.9 5. Ordered repletion with: protocol 6. If a panic level lab has been reported, has the CCM MD in charge been notified? No..   Physician:    Markus DaftWHELAN, Kiah Keay A 11/08/2016 6:12 AM

## 2016-11-08 NOTE — Progress Notes (Signed)
Patient returned to vent due to RR of 4-5 on ATC. RT will continue to monitor patient.

## 2016-11-08 NOTE — Procedures (Signed)
History: 50 year old male being evaluated for encephalopathy  Sedation: Clonazepam, fentanyl  Technique: This is a 21 channel routine scalp EEG performed at the bedside with bipolar and monopolar montages arranged in accordance to the international 10/20 system of electrode placement. One channel was dedicated to EKG recording.   Background: The background consists of irregular delta and theta activities. There was no clear posterior dominant rhythm seen.  Photic stimulation: Physiologic driving is   EEG Abnormalities: 1) generalized irregular slow activity 2) absent PDR  Clinical Interpretation: This EEG is consistent with a nonspecific generalized cerebral dysfunction(encephalopathy). There was no seizure or seizure predisposition recorded on this study. Please note that a normal EEG does not preclude the possibility of epilepsy.   Ritta SlotMcNeill Kirkpatrick, MD [facility] [phone]  If 7pm- 7am, please page neurology on call as listed in AMION.

## 2016-11-08 NOTE — Care Management Note (Signed)
Case Management Note  Patient Details  Name: Dale [name] MRN: [id number] Date of Birth: 04/23/1966  Subjective/Objective:      Pt admitted with altered mental status , n/v and diarrhea              Action/Plan:  [facility]  Pt has been trached this admit and is alternating between TC and vent.  CSW consulted   Expected Discharge Date:                  Expected Discharge Plan:  Skilled Nursing Facility  In-House Referral:  Clinical Social Work  Discharge planning Services  CM Consult  Post Acute Care Choice:    Choice offered to:     DME Arranged:    DME Agency:     HH Arranged:    HH Agency:     Status of Service:  In process, will continue to follow  If discussed at Long Length of Stay Meetings, dates discussed:    Additional Comments: 11/08/2016 CM informed by PCCM PA that pt will most likely wean quickly from ventilator and therefore discharge plan is for SNF that are TC capable.  Pt is not eligible for LTACH due to insurance. CSW actively following for placement Dale [name], Dale Zambrana S, RN 11/08/2016, 3:24 PM

## 2016-11-08 NOTE — Progress Notes (Signed)
Report given to [facility] RN on [facility]M. Pt being transferred to room 12 for lateral transfer. Will continue to monitor.

## 2016-11-08 NOTE — Progress Notes (Signed)
EEG completed, results pending. 

## 2016-11-08 NOTE — Progress Notes (Signed)
PULMONARY / CRITICAL CARE MEDICINE   Name: Dale HollowCraig D Fassler MRN: [id number] DOB: 03/24/1966    ADMISSION DATE:  2016/02/20  CHIEF COMPLAINT:  Nausea and Vomiting  50 yom w/ severe neurological deficits after meningitis.  He was brought to the ED with decreased level of consciousness and nausea/vomiting/diarrhea. A CXR showed consolidation of the left upper lobe. He was intubated and [facility].   SUBJECTIVE: Agitated. Requiring PRN sedation   VITAL SIGNS: BP 99/70   Pulse 78   Temp 97.5 F (36.4 C) (Oral)   Resp 20   Ht 5\' 8"  (1.727 m)   Wt 166 lb 14.2 oz (75.7 kg)   SpO2 100%   BMI 25.38 kg/m   HEMODYNAMICS:    VENTILATOR SETTINGS: Vent Mode: PRVC FiO2 (%):  [30 %-40 %] 35 % Set Rate:  [20 bmp] 20 bmp Vt Set:  [410 mL] 410 mL PEEP:  [5 cmH20] 5 cmH20 Plateau Pressure:  [16 cmH20] 16 cmH20  INTAKE / OUTPUT: I/O last 3 completed shifts: In: 3991.3 [I.V.:456.3; Other:230; NG/GT:3305] Out: 2390 [Urine:2390]  PHYSICAL EXAMINATION: General: Middle aged male , chr ill appearing, wiggling around in bed Neuro: CN grossly intact. (-) lateralizing signs.  Opens eyes to commands.  HEENT: [place]/AT. PERRL, sclerae anicteric. Cardiovascular: RRR, no M/R/G.  Lungs: Respirations even and unlabored. diffuse scattered rhonchi, more on L lung Abdomen: BS x 4, soft, NT/ND.  Musculoskeletal: No gross deformities, no edema.  Skin: Intact, warm, no rashes.  LABS:  BMET  Recent Labs Lab 11/06/16 0320 11/07/16 0402 11/08/16 0418  NA 148* 146* 146*  K 3.3* 4.1 2.9*  CL 108 108 106  CO2 29 29 31   BUN 17 17 13   CREATININE 0.75 0.83 0.66  GLUCOSE 88 88 88    Electrolytes  Recent Labs Lab 11/05/16 0400 11/06/16 0320 11/07/16 0402 11/08/16 0418  CALCIUM 7.7* 7.8* 7.9* 8.0*  MG 1.9 1.8 1.8  --   PHOS 4.2 4.1 4.4  --     CBC  Recent Labs Lab 11/06/16 0320 11/07/16 0402 11/08/16 0418  WBC 12.8* 13.1* 8.7  HGB 8.6* 8.8* 7.9*  HCT 27.3* 28.0* 24.9*  PLT 160  177 193    Coag's No results for input(s): APTT, INR in the last 168 hours.  Sepsis Markers No results for input(s): LATICACIDVEN, PROCALCITON, O2SATVEN in the last 168 hours.  ABG No results for input(s): PHART, PCO2ART, PO2ART in the last 168 hours.  Liver Enzymes No results for input(s): AST, ALT, ALKPHOS, BILITOT, ALBUMIN in the last 168 hours.  Cardiac Enzymes No results for input(s): TROPONINI, PROBNP in the last 168 hours.  Glucose  Recent Labs Lab 11/15/2016 1635 11/16/2016 2018 11/08/16 0216  GLUCAP 87 95 76    Imaging No results found.   STUDIES:  CXR 11/26 >> LUL PNA  CULTURES: Blood 11/26 >> coag neg staph 1/2  C. Diff 11/26 >> (-) resp 11/27 >>  Enterobacter cloacae  ANTIBIOTICS: Vanc 11/26 >>11/30 Pip-tazo 11/26 >>12/1 Cipro 12/1 >   SIGNIFICANT EVENTS: Intubated (endotracheally) at OSH ED 11/26 >> 11/29 bronch - left inspissated pus  LINES/TUBES: PEG PICC 11/27  RUE >> Foley Trache 12/1   DISCUSSION: 50 y/o man s/p devastating meningitis, chronic debility, p/w fever, AMS & severe L HCAP vs aspiration. Agitation major barrier to progress at this point. Will add back his home meds (Klonopin and seroquel). Goal is to get him off fent gtt.   ASSESSMENT / PLAN:  PULMONARY A: Acute resp failure/  ARDS/ L HCAP with Enterobacter cloacae Hx of trach x2. Trached for the 3rd time on 12/1 Left hemithorax opacification 11/29 P:   ATC as tolerated. NO Decannulation this time for airway protection and to facilitate MV if / when he decompensates in the future (which will likely happen) cont neb meds.  Needs LTAC  CARDIOVASCULAR A:  Chronic CHF -EF 40% 07/2016 Hx of autonomic dysreflexia 11/28 transient AF - converted to nSR P:  Lopressor 5 q4 prn  RENAL A:   Hypernatremia  hypophos Acute kidney injury -resolved P:   Lytes as needed ct free water in PEG   GASTROINTESTINAL A:   Need for TF Hypoalbuminemia P:   ct TF     HEMATOLOGIC A:   Anemia P:  Likely 2/2 chronic disease  INFECTIOUS A:   Severe Sepsis, Lt HCAP - Enterobacter cloacae. Cdiff (-) Hx of MRSA bactermia Hx of HCAP with Enterobacter >> MDR. P:   On ciprofloxacin (complete 8 days) c diff (-) > will observe for now.   ENDOCRINE A:   Adrenal insufficency. Chart review with Pharmacy 12/3 : pt allegedly on HC 100 mg TID and Florinef 0.1 mg daily P:   Changed hydrocort to 5mg  bid, added florinef 12/4  NEUROLOGIC A:   Devastating CNS injury in March '17 Acute on chronic encephalopathy. Hx seizures, strep pneumo meningitis, CVA. P:   Continue depakote, keppra  resume  Seroquel & clonazepam  Prn meds  FAMILY  - Updates: wife shirley 11/29, agreeable to trach , agreeable to palliative conversation 'after the holidays' - will involve. Will need LTAC. No family 12/3 or 12/4  - Inter-disciplinary family meet or Palliative Care meeting due by: 12/3, has been resistant to these conversations in the past  Simonne MartinetPeter E Babcock ACNP-BC [facility] Pulmonary/Critical Care Pager # [phone] OR # [phone] if no answer

## 2016-11-08 NOTE — Progress Notes (Signed)
Patient placed on 35% trach collar.  Currently tolerating well.  Sats currently 100%.  Vitals are stable.  Will continue to monitor.

## 2016-11-09 LAB — RENAL FUNCTION PANEL
Albumin: 1.3 g/dL — ABNORMAL LOW (ref 3.5–5.0)
Anion gap: 10 (ref 5–15)
BUN: 9 mg/dL (ref 6–20)
CHLORIDE: 105 mmol/L (ref 101–111)
CO2: 29 mmol/L (ref 22–32)
CREATININE: 0.63 mg/dL (ref 0.61–1.24)
Calcium: 7.7 mg/dL — ABNORMAL LOW (ref 8.9–10.3)
GFR calc Af Amer: >60 mL/min (ref >60)
GFR calc non Af Amer: >60 mL/min (ref >60)
Glucose, Bld: 114 mg/dL — ABNORMAL HIGH (ref 65–99)
POTASSIUM: 3.2 mmol/L — AB (ref 3.5–5.1)
Phosphorus: 4.4 mg/dL (ref 2.5–4.6)
Sodium: 144 mmol/L (ref 135–145)

## 2016-11-09 LAB — MAGNESIUM: Magnesium: 1.5 mg/dL — ABNORMAL LOW (ref 1.7–2.4)

## 2016-11-09 LAB — CBC WITH DIFFERENTIAL/PLATELET
BASOS PCT: 0 %
Basophils Absolute: 0 10*3/uL (ref 0.0–0.1)
EOS ABS: 0 10*3/uL (ref 0.0–0.7)
Eosinophils Relative: 0 %
HEMATOCRIT: 27.5 % — AB (ref 39.0–52.0)
Hemoglobin: 8.8 g/dL — ABNORMAL LOW (ref 13.0–17.0)
LYMPHS ABS: 1.4 10*3/uL (ref 0.7–4.0)
Lymphocytes Relative: 11 %
MCH: 29.9 pg (ref 26.0–34.0)
MCHC: 32 g/dL (ref 30.0–36.0)
MCV: 93.5 fL (ref 78.0–100.0)
MONO ABS: 0.6 10*3/uL (ref 0.1–1.0)
Monocytes Relative: 5 %
NEUTROS ABS: 10.6 10*3/uL — AB (ref 1.7–7.7)
Neutrophils Relative %: 84 %
PLATELETS: 217 10*3/uL (ref 150–400)
RBC: 2.94 MIL/uL — ABNORMAL LOW (ref 4.22–5.81)
RDW: 17.1 % — AB (ref 11.5–15.5)
WBC: 12.6 10*3/uL — ABNORMAL HIGH (ref 4.0–10.5)

## 2016-11-09 MED ORDER — CLONAZEPAM 0.25 MG PO TBDP
0.2500 mg | ORAL_TABLET | Freq: Four times a day (QID) | ORAL | Status: DC
  Administered 2016-11-09 – 2016-11-11 (×6): 0.25 mg
  Filled 2016-11-09 (×6): qty 2

## 2016-11-09 MED ORDER — FREE WATER
200.0000 mL | Status: DC
  Administered 2016-11-09 – 2016-11-12 (×17): 200 mL

## 2016-11-09 MED ORDER — POTASSIUM CHLORIDE 20 MEQ/15ML (10%) PO SOLN
40.0000 meq | ORAL | Status: AC
  Administered 2016-11-09 (×2): 40 meq
  Filled 2016-11-09 (×2): qty 30

## 2016-11-09 MED ORDER — SODIUM CHLORIDE 0.9 % IV BOLUS (SEPSIS)
500.0000 mL | Freq: Once | INTRAVENOUS | Status: AC
  Administered 2016-11-09: 500 mL via INTRAVENOUS

## 2016-11-09 MED ORDER — QUETIAPINE FUMARATE 50 MG PO TABS
75.0000 mg | ORAL_TABLET | Freq: Two times a day (BID) | ORAL | Status: DC
  Administered 2016-11-09 (×2): 75 mg via ORAL
  Filled 2016-11-09 (×3): qty 1

## 2016-11-09 MED ORDER — MAGNESIUM SULFATE 4 GM/100ML IV SOLN
4.0000 g | Freq: Once | INTRAVENOUS | Status: AC
  Administered 2016-11-09: 4 g via INTRAVENOUS
  Filled 2016-11-09: qty 100

## 2016-11-09 NOTE — Progress Notes (Signed)
PULMONARY / CRITICAL CARE MEDICINE   Name: Dale [name] MRN: [id number] DOB: 10/16/1966    ADMISSION DATE:  10/08/2016  CHIEF COMPLAINT:  Nausea and Vomiting  50 yom w/ severe neurological deficits after meningitis.  He was brought to the ED with decreased level of consciousness and nausea/vomiting/diarrhea. A CXR showed consolidation of the left upper lobe. [facility]ub[facility]   SUBJECTIVE: Resting currently    VITAL SIGNS: BP 120/77 (BP Location: Left Arm)   Pulse (!) 102   Temp 98.1 F (36.7 C) (Oral)   Resp (!) 21   Ht 5\' 8"  (1.727 m)   Wt 167 lb 15.9 oz (76.2 kg)   SpO2 95%   BMI 25.54 kg/m   HEMODYNAMICS:    VENTILATOR SETTINGS: Vent Mode: PRVC FiO2 (%):  [35 %-50 %] 50 % Set Rate:  [20 bmp] 20 bmp Vt Set:  [410 mL-420 mL] 420 mL PEEP:  [5 cmH20] 5 cmH20 Plateau Pressure:  [13 cmH20-18 cmH20] 13 cmH20  INTAKE / OUTPUT: I/O last 3 completed shifts: In: 4803.7 [I.V.:503.7; Other:525; NG/GT:3775] Out: 4385 [Urine:4385]  PHYSICAL EXAMINATION: General: Middle aged male , chr ill appearing, calm appearing and in no distress.  Neuro: CN grossly intact. (-) lateralizing signs.  Opens eyes to commands.  HEENT: [place]/AT. PERRL, sclerae anicteric. Cardiovascular: RRR, no M/R/G.  Lungs: Respirations even and unlabored. diffuse scattered rhonchi, more on L lung Abdomen: BS x 4, soft, NT/ND.  Musculoskeletal: No gross deformities, no edema.  Skin: Intact, warm, no rashes.  LABS:  BMET  Recent Labs Lab 11/07/16 0402 11/08/16 0418 11/09/16 0553  NA 146* 146* 144  K 4.1 2.9* 3.2*  CL 108 106 105  CO2 29 31 29   BUN 17 13 9   CREATININE 0.83 0.66 0.63  GLUCOSE 88 88 114*    Electrolytes  Recent Labs Lab 11/06/16 0320 11/07/16 0402 11/08/16 0418 11/09/16 0553  CALCIUM 7.8* 7.9* 8.0* 7.7*  MG 1.8 1.8  --  1.5*  PHOS 4.1 4.4  --  4.4    CBC  Recent Labs Lab 11/07/16 0402 11/08/16 0418 11/09/16 0553  WBC 13.1* 8.7 12.6*  HGB  8.8* 7.9* 8.8*  HCT 28.0* 24.9* 27.5*  PLT 177 193 217    Coag's No results for input(s): APTT, INR in the last 168 hours.  Sepsis Markers No results for input(s): LATICACIDVEN, PROCALCITON, O2SATVEN in the last 168 hours.  ABG No results for input(s): PHART, PCO2ART, PO2ART in the last 168 hours.  Liver Enzymes  Recent Labs Lab 11/09/16 0553  ALBUMIN 1.3*    Cardiac Enzymes No results for input(s): TROPONINI, PROBNP in the last 168 hours.  Glucose  Recent Labs Lab 11/06/2016 1635 12/01/2016 2018 11/08/16 0216  GLUCAP 87 95 76    Imaging No results found.   STUDIES:  CXR 11/26 >> LUL PNA EEG 12/5: negative for seizure   CULTURES: Blood 11/26 >> coag neg staph 1/2  C. Diff 11/26 >> (-) resp 11/27 >>  Enterobacter cloacae  ANTIBIOTICS: Vanc 11/26 >>11/30 Pip-tazo 11/26 >>12/1 Cipro 12/1 >   SIGNIFICANT EVENTS: Intubated (endotracheally) at OSH ED 11/26 >> 11/29 bronch - left inspissated pus  LINES/TUBES: PEG PICC 11/27  RUE >> Foley Trache 12/1   DISCUSSION: 50 y/o man s/p devastating meningitis, chronic debility, p/w fever, AMS & severe L HCAP vs aspiration. Agitation major barrier to progress at this point. Added back his home meds (Klonopin and seroquel). Goal is to get him off fent  gtt. Will increase seroquel. He looks comfortable on ATC this am   ASSESSMENT / PLAN:  PULMONARY A: Acute resp failure/ ARDS/ L HCAP with Enterobacter cloacae Hx of trach x2. Trached for the 3rd time on 12/1 Left hemithorax opacification 11/29 P:   ATC as tolerated. NO Decannulation this time for airway protection and to facilitate MV if / when he decompensates in the future (which will likely happen) cont neb meds.  If we can keep him off vent we will change to cuffless trach   CARDIOVASCULAR A:  Chronic CHF -EF 40% 07/2016 Hx of autonomic dysreflexia 11/28 transient AF - converted to nSR P:  Lopressor 5 q4 prn  RENAL A:   Hypernatremia   hypophos Acute kidney injury -resolved P:   Lytes as needed ct free water in PEG   GASTROINTESTINAL A:   Need for TF Hypoalbuminemia P:   ct TF    HEMATOLOGIC A:   Anemia P:  Likely 2/2 chronic disease  INFECTIOUS A:   Severe Sepsis, Lt HCAP - Enterobacter cloacae. Cdiff (-) Hx of MRSA bactermia Hx of HCAP with Enterobacter >> MDR. P:   On ciprofloxacin (complete 8 days) c diff (-) > will observe for now.   ENDOCRINE A:   Adrenal insufficency. Chart review with Pharmacy 12/3 : pt allegedly on HC 100 mg TID and Florinef 0.1 mg daily P:   Changed hydrocort to 5mg  bid, added florinef 12/4  NEUROLOGIC A:   Devastating CNS injury in March '17 Acute on chronic encephalopathy. Hx seizures, strep pneumo meningitis, CVA. Spot EEG 12/5 was negative for seizure  P:   Continue depakote, keppra  resumed  Seroquel & clonazepam -->will increase seroquel to see if we can get him off fentanyl  Prn meds  FAMILY  - Updates: wife shirley 11/29, agreeable to trach , agreeable to palliative conversation 'after the holidays' - will involve. Will need LTAC. No family 12/3 or 12/4  - Inter-disciplinary family meet or Palliative Care meeting due by: 12/3, has been resistant to these conversations in the past  20 minutes CCM time   Simonne MartinetPeter E Aliza Moret ACNP-BC [facility] Pulmonary/Critical Care Pager # [phone] OR # [phone] if no answer

## 2016-11-09 NOTE — Progress Notes (Signed)
225cc of fentanyl drip wasted in sink. Witnessed by Constellation BrandsChristian RN.

## 2016-11-10 DIAGNOSIS — E876 Hypokalemia: Secondary | ICD-10-CM

## 2016-11-10 LAB — CBC WITH DIFFERENTIAL/PLATELET
BASOS ABS: 0 10*3/uL (ref 0.0–0.1)
BASOS PCT: 0 %
EOS ABS: 0 10*3/uL (ref 0.0–0.7)
Eosinophils Relative: 0 %
HEMATOCRIT: 28.1 % — AB (ref 39.0–52.0)
Hemoglobin: 9 g/dL — ABNORMAL LOW (ref 13.0–17.0)
Lymphocytes Relative: 16 %
Lymphs Abs: 1.9 10*3/uL (ref 0.7–4.0)
MCH: 30.1 pg (ref 26.0–34.0)
MCHC: 32 g/dL (ref 30.0–36.0)
MCV: 94 fL (ref 78.0–100.0)
MONO ABS: 0.6 10*3/uL (ref 0.1–1.0)
MONOS PCT: 5 %
NEUTROS ABS: 9.2 10*3/uL — AB (ref 1.7–7.7)
NEUTROS PCT: 79 %
Platelets: 247 10*3/uL (ref 150–400)
RBC: 2.99 MIL/uL — ABNORMAL LOW (ref 4.22–5.81)
RDW: 17.9 % — AB (ref 11.5–15.5)
WBC: 11.7 10*3/uL — ABNORMAL HIGH (ref 4.0–10.5)

## 2016-11-10 LAB — RENAL FUNCTION PANEL
ALBUMIN: 1.4 g/dL — AB (ref 3.5–5.0)
Anion gap: 9 (ref 5–15)
BUN: 9 mg/dL (ref 6–20)
CALCIUM: 7.7 mg/dL — AB (ref 8.9–10.3)
CO2: 29 mmol/L (ref 22–32)
CREATININE: 0.63 mg/dL (ref 0.61–1.24)
Chloride: 106 mmol/L (ref 101–111)
GFR calc Af Amer: >60 mL/min (ref >60)
GLUCOSE: 95 mg/dL (ref 65–99)
PHOSPHORUS: 5 mg/dL — AB (ref 2.5–4.6)
Potassium: 3.4 mmol/L — ABNORMAL LOW (ref 3.5–5.1)
SODIUM: 144 mmol/L (ref 135–145)

## 2016-11-10 LAB — MAGNESIUM: MAGNESIUM: 1.9 mg/dL (ref 1.7–2.4)

## 2016-11-10 MED ORDER — QUETIAPINE FUMARATE 50 MG PO TABS
50.0000 mg | ORAL_TABLET | Freq: Two times a day (BID) | ORAL | Status: DC
  Administered 2016-11-10 (×2): 50 mg via ORAL
  Filled 2016-11-10 (×3): qty 1

## 2016-11-10 MED ORDER — POTASSIUM CHLORIDE 20 MEQ/15ML (10%) PO SOLN
40.0000 meq | Freq: Once | ORAL | Status: AC
Start: 2016-11-10 — End: 2016-11-10
  Administered 2016-11-10: 40 meq
  Filled 2016-11-10: qty 30

## 2016-11-10 NOTE — Care Management Note (Signed)
Case Management Note  Patient Details  Name: Bonnita HollowCraig D Oboyle MRN: [id number] Date of Birth: 01/25/1966  Subjective/Objective:      Pt admitted with altered mental status , n/v and diarrhea              Action/Plan:  [facility]  Pt has been trached this admit and is alternating between TC and vent.  CSW consulted   Expected Discharge Date:                  Expected Discharge Plan:  Skilled Nursing Facility  In-House Referral:  Clinical Social Work  Discharge planning Services  CM Consult  Post Acute Care Choice:    Choice offered to:     DME Arranged:    DME Agency:     HH Arranged:    HH Agency:     Status of Service:  In process, will continue to follow  If discussed at Long Length of Stay Meetings, dates discussed:    Additional Comments: 11/10/2016  Discussed in LOS 11/10/16:  Remains appropriate for continued stay  11/08/16 CM informed by PCCM PA that pt will most likely wean quickly from ventilator and therefore discharge plan is for SNF that are TC capable.  Pt is not eligible for LTACH due to insurance. CSW actively following for placement Cherylann ParrClaxton, Cherith Tewell S, RN 11/10/2016, 2:16 PM

## 2016-11-10 NOTE — Progress Notes (Signed)
Nutrition Follow-up  DOCUMENTATION CODES:   Severe malnutrition in context of chronic illness  INTERVENTION:    Continue Vital AF 1.2 via PEG at 65 ml/h to provide 1872 kcals, 117 gm protein, 1265 ml free water daily.  Continue free water flushes 200 ml every 4 hours.  NUTRITION DIAGNOSIS:   Malnutrition (Severe) related to chronic illness as evidenced by severe depletion of body fat, severe depletion of muscle mass.  Ongoing  GOAL:   Patient will meet greater than or equal to 90% of their needs  Met  MONITOR:   TF tolerance, I & O's, Vent status, Labs  ASSESSMENT:   Pt with strep pneumo meningitis c/b cardiac arrest and cardiogenic shock, ARDS requiring prolonged intubation and trach (now decannulated), and acute renal failure.  He was admitted to [facility] 06/16/16 through 07/11/16 for recurrent hypoglycemia due to adrenal insufficiency, new subacute CVA, seizures with inability to protect airway s/p intubation 7/13 through 7/21.  He re-presented 8/08 again with sepsis from UTI, and was intubated due to AMS, and was trached 8/18, and was discharged with a cuffless trach. That hospitalization was c/b MRSA bacteremia 8/24, and completed 3 weeks of IV vanc. He also developed C. Diff, and was treated with PO vanc through 9/11. He has had continued autonomic dysreflexia, which worsens during complications of acute illnesses. Admitted with N/V/D.   Discussed patient in ICU rounds and with RN today. S/P trach and PEG. Currently on trach collar. Loose stools continue, C diff negative. Receiving Vital AF 1.2 via PEG at 65 ml/h to provide 1872 kcals, 117 gm protein, 1265 ml free water daily. Free water flushes 200 ml every 4 hours. Labs reviewed: potassium low; phosphorus elevated. Medications reviewed and include folic acid, thiamine.  Diet Order:   NPO  Skin:  Wound (see comment) (unstageable right heel)  Last BM:  12/7 loose  Height:   Ht Readings from Last 1 Encounters:  10/31/16  '5\' 8"'$  (1.727 m)    Weight:   Wt Readings from Last 1 Encounters:  11/10/16 168 lb 10.4 oz (76.5 kg)    Ideal Body Weight:  70 kg  BMI:  Body mass index is 25.64 kg/m.  Estimated Nutritional Needs:   Kcal:  1900  Protein:  100-120 grams  Fluid:  >1.8 L/day  EDUCATION NEEDS:   No education needs identified at this time  Molli Barrows, [place], [place], [place] Pager [phone] After Hours Pager [phone]

## 2016-11-10 NOTE — Progress Notes (Signed)
Trach care done per RRT. Site was cleansed with NS and sterile water, dried, and dressing applied. New IC inserted and locked back into place without complications. Pt tolerating trach care and suctioning well no distress noted. RN aware. Pt does have a strong productive cough.

## 2016-11-10 NOTE — Progress Notes (Signed)
PULMONARY / CRITICAL CARE MEDICINE   Name: Dale [name] MRN: [id number] DOB: 09/26/1966    ADMISSION DATE:  10/05/2016  CHIEF COMPLAINT:  Nausea and Vomiting  50 yom w/ severe neurological deficits after meningitis.  He was brought to the ED with decreased level of consciousness and nausea/vomiting/diarrhea. A CXR showed consolidation of the left upper lobe. [facility]   SUBJECTIVE: Resting currently  No distress.   VITAL SIGNS: BP 122/80   Pulse (!) 120   Temp 99 F (37.2 C) (Oral)   Resp (!) 30   Ht 5\' 8"  (1.727 m)   Wt 168 lb 10.4 oz (76.5 kg)   SpO2 100%   BMI 25.64 kg/m   HEMODYNAMICS:    VENTILATOR SETTINGS: FiO2 (%):  [35 %-50 %] 40 %  INTAKE / OUTPUT: I/O last 3 completed shifts: In: 3912.5 [I.V.:237.5; Other:100; NG/GT:2575; IV Piggyback:1000] Out: 4925 [Urine:4925]  PHYSICAL EXAMINATION: General: Middle aged male , chronically ill appearing, calm appearing and in no distress.  Neuro: CN grossly intact. (-) lateralizing signs.  Opens eyes to commands.  HEENT: [place]/AT. PERRL, sclerae anicteric. Cardiovascular: RRR, no M/R/G.  Lungs: Respirations even and unlabored. diffuse scattered rhonchi, more on L lung Abdomen: BS x 4, soft, NT/ND.  Musculoskeletal: No gross deformities, no edema.  Skin: Intact, warm, no rashes.  LABS:  BMET  Recent Labs Lab 11/08/16 0418 11/09/16 0553 11/10/16 0612  NA 146* 144 144  K 2.9* 3.2* 3.4*  CL 106 105 106  CO2 31 29 29   BUN 13 9 9   CREATININE 0.66 0.63 0.63  GLUCOSE 88 114* 95    Electrolytes  Recent Labs Lab 11/07/16 0402 11/08/16 0418 11/09/16 0553 11/10/16 0612  CALCIUM 7.9* 8.0* 7.7* 7.7*  MG 1.8  --  1.5* 1.9  PHOS 4.4  --  4.4 5.0*    CBC  Recent Labs Lab 11/08/16 0418 11/09/16 0553 11/10/16 0612  WBC 8.7 12.6* 11.7*  HGB 7.9* 8.8* 9.0*  HCT 24.9* 27.5* 28.1*  PLT 193 217 247    Coag's No results for input(s): APTT, INR in the last 168 hours.  Sepsis  Markers No results for input(s): LATICACIDVEN, PROCALCITON, O2SATVEN in the last 168 hours.  ABG No results for input(s): PHART, PCO2ART, PO2ART in the last 168 hours.  Liver Enzymes  Recent Labs Lab 11/09/16 0553 11/10/16 0612  ALBUMIN 1.3* 1.4*    Cardiac Enzymes No results for input(s): TROPONINI, PROBNP in the last 168 hours.  Glucose  Recent Labs Lab 11-28-16 1635 11-28-16 2018 11/08/16 0216  GLUCAP 87 95 76    Imaging No results found.   STUDIES:  CXR 11/26 >> LUL PNA EEG 12/5: negative for seizure   CULTURES: Blood 11/26 >> coag neg staph 1/2  C. Diff 11/26 >> (-) resp 11/27 >>  Enterobacter cloacae  ANTIBIOTICS: Vanc 11/26 >>11/30 Pip-tazo 11/26 >>12/1 Cipro 12/1 >   SIGNIFICANT EVENTS: Intubated (endotracheally) at OSH ED 11/26 >> 11/29 bronch - left inspissated pus  LINES/TUBES: PEG PICC 11/27  RUE >> Foley Trache 12/1   DISCUSSION: 50 y/o man s/p devastating meningitis, chronic debility, p/w fever, AMS & severe L HCAP vs aspiration. Agitation major barrier to progress at this point. Added back his home meds (Klonopin and seroquel). Now off vent. Trying to optimize sedation regimen. He is ready for SDU setting.   ASSESSMENT / PLAN:  PULMONARY A: Acute resp failure/ ARDS/ L HCAP with Enterobacter cloacae Hx of trach x2. Trached  for the 3rd time on 12/1 Left hemithorax opacification 11/29 P:   ATC as tolerated. NO Decannulation this time for airway protection and to facilitate MV if / when he decompensates in the future (which will likely happen) cont neb meds.  If we can keep him off vent we will change to cuffless trach   CARDIOVASCULAR A:  Chronic CHF -EF 40% 07/2016 Hx of autonomic dysreflexia 11/28 transient AF - converted to nSR P:  Lopressor 5 q4 prn  RENAL A:   Hypernatremia  Hypokalemia  Acute kidney injury -resolved P:   Lytes as needed ct free water in PEG   GASTROINTESTINAL A:   Need for  TF Hypoalbuminemia P:   ct TF    HEMATOLOGIC A:   Anemia P:  Likely 2/2 chronic disease  INFECTIOUS A:   Severe Sepsis, Lt HCAP - Enterobacter cloacae. Cdiff (-) Hx of MRSA bactermia Hx of HCAP with Enterobacter >> MDR. P:   On ciprofloxacin (complete 8 days) c diff (-) > will observe for now.   ENDOCRINE A:   Adrenal insufficency. Chart review with Pharmacy 12/3 : pt allegedly on HC 100 mg TID and Florinef 0.1 mg daily P:   Changed hydrocort to 5mg  bid, added florinef 12/4  NEUROLOGIC A:   Devastating CNS injury in March '17 Acute on chronic encephalopathy. Hx seizures, strep pneumo meningitis, CVA. Spot EEG 12/5 was negative for seizure  P:   Continue depakote, keppra  resumed  Seroquel & clonazepam  Decreased Seroquel back to 50mg  bid, had pharmacy eval to schedule medications around so that they are more evenly distributed.  Prn meds  FAMILY  - Updates: wife shirley 11/29, agreeable to trach , agreeable to palliative conversation 'after the holidays' - will involve. Will need LTAC. No family 12/3 or 12/4  - Inter-disciplinary family meet or Palliative Care meeting due by: 12/3, has been resistant to these conversations in the past  20 minutes CCM time   Simonne MartinetPeter E Everley Evora ACNP-BC [facility] Pulmonary/Critical Care Pager # [phone] OR # [phone] if no answer

## 2016-11-11 LAB — CBC WITH DIFFERENTIAL/PLATELET
BASOS ABS: 0 10*3/uL (ref 0.0–0.1)
BASOS PCT: 0 %
EOS PCT: 0 %
Eosinophils Absolute: 0 10*3/uL (ref 0.0–0.7)
HCT: 26.6 % — ABNORMAL LOW (ref 39.0–52.0)
Hemoglobin: 8.4 g/dL — ABNORMAL LOW (ref 13.0–17.0)
Lymphocytes Relative: 12 %
Lymphs Abs: 1.3 10*3/uL (ref 0.7–4.0)
MCH: 29.5 pg (ref 26.0–34.0)
MCHC: 31.6 g/dL (ref 30.0–36.0)
MCV: 93.3 fL (ref 78.0–100.0)
MONO ABS: 0.6 10*3/uL (ref 0.1–1.0)
Monocytes Relative: 5 %
Neutro Abs: 9.2 10*3/uL — ABNORMAL HIGH (ref 1.7–7.7)
Neutrophils Relative %: 83 %
PLATELETS: 263 10*3/uL (ref 150–400)
RBC: 2.85 MIL/uL — ABNORMAL LOW (ref 4.22–5.81)
RDW: 17.7 % — AB (ref 11.5–15.5)
WBC: 11.1 10*3/uL — ABNORMAL HIGH (ref 4.0–10.5)

## 2016-11-11 LAB — RENAL FUNCTION PANEL
ALBUMIN: 1.4 g/dL — AB (ref 3.5–5.0)
Anion gap: 11 (ref 5–15)
BUN: 8 mg/dL (ref 6–20)
CO2: 30 mmol/L (ref 22–32)
CREATININE: 0.59 mg/dL — AB (ref 0.61–1.24)
Calcium: 8 mg/dL — ABNORMAL LOW (ref 8.9–10.3)
Chloride: 104 mmol/L (ref 101–111)
GFR calc Af Amer: >60 mL/min (ref >60)
Glucose, Bld: 111 mg/dL — ABNORMAL HIGH (ref 65–99)
PHOSPHORUS: 5.5 mg/dL — AB (ref 2.5–4.6)
POTASSIUM: 3.4 mmol/L — AB (ref 3.5–5.1)
Sodium: 145 mmol/L (ref 135–145)

## 2016-11-11 LAB — GLUCOSE, CAPILLARY
GLUCOSE-CAPILLARY: 77 mg/dL (ref 65–99)
Glucose-Capillary: 94 mg/dL (ref 65–99)

## 2016-11-11 LAB — MAGNESIUM: MAGNESIUM: 1.8 mg/dL (ref 1.7–2.4)

## 2016-11-11 MED ORDER — POTASSIUM CHLORIDE 20 MEQ/15ML (10%) PO SOLN
40.0000 meq | Freq: Once | ORAL | Status: AC
  Administered 2016-11-11: 40 meq
  Filled 2016-11-11: qty 30

## 2016-11-11 MED ORDER — QUETIAPINE FUMARATE 50 MG PO TABS
50.0000 mg | ORAL_TABLET | Freq: Two times a day (BID) | ORAL | Status: DC
  Administered 2016-11-11 – 2016-12-23 (×83): 50 mg
  Filled 2016-11-11 (×27): qty 1
  Filled 2016-11-11: qty 2
  Filled 2016-11-11 (×56): qty 1

## 2016-11-11 MED ORDER — CLONAZEPAM 0.5 MG PO TABS
0.2500 mg | ORAL_TABLET | Freq: Four times a day (QID) | ORAL | Status: DC
  Administered 2016-11-11 – 2016-12-16 (×129): 0.25 mg
  Filled 2016-11-11 (×131): qty 1

## 2016-11-11 MED ORDER — CIPROFLOXACIN HCL 750 MG PO TABS
750.0000 mg | ORAL_TABLET | Freq: Two times a day (BID) | ORAL | Status: AC
  Administered 2016-11-11: 750 mg
  Filled 2016-11-11: qty 1

## 2016-11-11 MED ORDER — LORAZEPAM 2 MG/ML PO CONC
1.0000 mg | Freq: Four times a day (QID) | ORAL | Status: DC | PRN
  Administered 2016-11-12 – 2016-12-16 (×44): 1 mg
  Filled 2016-11-11 (×48): qty 1

## 2016-11-11 NOTE — Progress Notes (Signed)
Patient  Admitted to room 4E23 , VSS tube feeding in place trach and suction set up

## 2016-11-11 NOTE — NC FL2 (Signed)
[place] LEVEL OF CARE SCREENING TOOL     IDENTIFICATION  Patient Name: HERNANDO REALI Birthdate: 10-26-1966 Sex: male Admission Date (Current Location): 10/29/2016  [place] and [place] Number:  Kathleen Argue [id number] [place] and Address:  [facility] [place]. [facility] [place] [address] [place], [place] [address]      Provider Number: [id number]  Attending Physician Name and Address:  Cherene Altes, MD  Relative Name and Phone Number:  Treyshaun Keatts (Spouse) [phone]    Current Level of Care: Hospital Recommended Level of Care: [place] Prior Approval Number:    Date Approved/Denied:   PASRR Number: [id number]  Discharge Plan: SNF    Current Diagnoses: Patient Active Problem List   Diagnosis Date Noted  . Respiratory failure ([place]) 10/20/2016  . Renal failure 10/18/2016  . Pressure injury of skin 11/02/2016  . HCAP (healthcare-associated pneumonia)   . MRSA bacteremia 07/28/2016  . C. difficile colitis 07/28/2016  . Encephalopathy 07/12/2016  . Acute hypoxemic respiratory failure ([place]) 07/12/2016  . Arterial hypotension   . Adrenal insufficiency (Addison's disease) ([place])   . Bradycardia   . Hypoglycemia 07/02/2016  . Seizures ([place])   . Dysphagia 05/04/2016  . Protein-calorie malnutrition ([place])   . Palliative care encounter   . Cerebral thrombosis with cerebral infarction 03/25/2016  . Acute CVA (cerebrovascular accident) ([place])   . Streptococcus pneumoniae meningitis   . Chronic combined systolic and diastolic CHF (congestive heart failure) ([place])   . Hypothermia 03/20/2016  . Tracheostomy status ([place])   . Goals of care, counseling/discussion   . Acute on chronic respiratory failure with hypoxia ([place]) 03/03/2016  . Septic shock ([place])   . Altered mental state   . History of bacterial meningitis 02/16/2016  . Acute encephalopathy   . Acute respiratory failure (HCC)     Orientation RESPIRATION BLADDER Height & Weight      Self  O2, Tracheostomy (35% trach collar- 54m shiley) External catheter Weight: 167 lb 8.8 oz (76 kg) Height:  _0  (172.7 cm)  BEHAVIORAL SYMPTOMS/MOOD NEUROLOGICAL BOWEL NUTRITION STATUS   (NONE)  (NONE) Incontinent (Gastrostomy) Feeding tube (Vital AF 1.2 Cal )  AMBULATORY STATUS COMMUNICATION OF NEEDS Skin   Total Care Does not communicate PU Stage and Appropriate Care (closed bilateral neck incision) PU Stage 1 Dressing:  (Pressure Injury unstageable right heel; foam dressing PRN)                     Personal Care Assistance Level of Assistance  Bathing, Feeding, Dressing, Total care Bathing Assistance: Maximum assistance Feeding assistance: Maximum assistance Dressing Assistance: Maximum assistance Total Care Assistance: Maximum assistance   Functional Limitations Info  Sight, Hearing, Speech Sight Info: Impaired Hearing Info: Impaired Speech Info: Impaired    SPECIAL CARE FACTORS FREQUENCY  PT (By licensed PT), OT (By licensed OT)                    Contractures Contractures Info: Not present    Additional Factors Info  Code Status, Allergies, Psychotropic, Isolation Precautions, Suctioning Needs Code Status Info: FULL Allergies Info: No Known Allergies Psychotropic Info: Seroquel; Klonopin   Isolation Precautions Info: MRSA; VRE     Current Medications (11/11/2016):  This is the current hospital active medication list Current Facility-Administered Medications  Medication Dose Route Frequency Provider Last Rate Last Dose  . 0.9 %  sodium chloride infusion  250 mL Intravenous PRN ALuz Brazen MD   Stopped at 11/06/16  0800  . atorvastatin (LIPITOR) tablet 20 mg  20 mg Per Tube q1800 Luz Brazen, MD   20 mg at 11/10/16 1733  . budesonide (PULMICORT) nebulizer solution 0.5 mg  0.5 mg Nebulization BID Jose Shirl [name], MD   0.5 mg at 11/11/16 0817  . chlorhexidine gluconate (MEDLINE KIT) (PERIDEX) 0.12 % solution 15 mL  15 mL Mouth Rinse BID  Rigoberto Noel, MD   15 mL at 11/11/16 0835  . ciprofloxacin (CIPRO) tablet 750 mg  750 mg Per Tube BID Cherene Altes, MD      . clonazepam [facility]) disintegrating tablet 0.25 mg  0.25 mg Per Tube Q6H Jake Church Masters, RPH   0.25 mg at 11/11/16 0519  . famotidine (PEPCID) 40 MG/5ML suspension 20 mg  20 mg Per Tube BID Luz Brazen, MD   20 mg at 11/10/16 2158  . feeding supplement (VITAL AF 1.2 CAL) liquid 1,000 mL  1,000 mL Per Tube Continuous Rigoberto Noel, MD 65 mL/hr at 11/10/16 1613 1,000 mL at 11/10/16 1613  . fentaNYL (SUBLIMAZE) bolus via infusion 20-100 mcg  20-100 mcg Intravenous Q2H PRN Rigoberto Noel, MD   100 mcg at 11/08/16 0830  . fludrocortisone (FLORINEF) tablet 0.1 mg  0.1 mg Per Tube Daily [place], MD   0.1 mg at 11/10/16 5638  . folic acid (FOLVITE) tablet 1 mg  1 mg Per Tube Daily Luz Brazen, MD   1 mg at 11/10/16 0903  . free water 200 mL  200 mL Per Tube Q4H Javier Glazier, MD   200 mL at 11/11/16 0800  . heparin injection 5,000 Units  5,000 Units Subcutaneous Q8H Luz Brazen, MD   5,000 Units at 11/11/16 0519  . hydrocortisone (CORTEF) tablet 10 mg  10 mg Per Tube Daily Javier Glazier, MD   10 mg at 11/10/16 0903  . hydrocortisone (CORTEF) tablet 20 mg  20 mg Per Tube QHS Javier Glazier, MD   20 mg at 11/10/16 2157  . ipratropium-albuterol (DUONEB) 0.5-2.5 (3) MG/3ML nebulizer solution 3 mL  3 mL Nebulization QID Jose Shirl [name], MD   3 mL at 11/11/16 0817  . levETIRAcetam (KEPPRA) 100 MG/ML solution 1,500 mg  1,500 mg Per Tube BID Luz Brazen, MD   1,500 mg at 11/10/16 2207  . LORazepam (ATIVAN) 2 MG/ML concentrated solution 1 mg  1 mg Per Tube Q6H PRN Cherene Altes, MD      . MEDLINE mouth rinse  15 mL Mouth Rinse 10 times per day Rigoberto Noel, MD   15 mL at 11/11/16 0519  . metoprolol (LOPRESSOR) injection 5 mg  5 mg Intravenous Q4H PRN Rigoberto Noel, MD   5 mg at 11/03/16 0837  . potassium chloride 20 MEQ/15ML (10%) solution  40 mEq  40 mEq Per Tube Once Cherene Altes, MD      . QUEtiapine (SEROQUEL) tablet 50 mg  50 mg Per Tube BID Cherene Altes, MD      . sodium chloride flush (NS) 0.9 % injection 10-40 mL  10-40 mL Intracatheter PRN Jose Angelo A Corrie Dandy, MD      . thiamine (VITAMIN B-1) tablet 100 mg  100 mg Per Tube Daily Luz Brazen, MD   100 mg at 11/10/16 0903  . Valproate Sodium (DEPAKENE) solution 750 mg  750 mg Per Tube Q8H Luz Brazen, MD   750 mg at 11/11/16 [phone]  Discharge Medications: Please see discharge summary for a list of discharge medications.  Relevant Imaging Results:  Relevant Lab Results:   Additional Information SSN:  [id number]  Lind Covert, LCSW

## 2016-11-11 NOTE — Progress Notes (Signed)
CSW confirmed with SNF facility that Patient can return pending Patient placed on cuffless trach w/ minimal secretions.          Enos FlingAshley Schlemmer, MSW, LCSW [facility] ED/7M Clinical Social Worker [phone]

## 2016-11-11 NOTE — Care Management Note (Signed)
Case Management Note  Patient Details  Name: Dale [name] MRN: [id number] Date of Birth: 10/31/1966  Subjective/Objective:      Pt admitted with altered mental status , n/v and diarrhea              Action/Plan:  [facility]  Pt has been trached this admit and is alternating between TC and vent.  CSW consulted   Expected Discharge Date:  11/11/16               Expected Discharge Plan:  Skilled Nursing Facility  In-House Referral:  Clinical Social Work  Discharge planning Services  CM Consult  Post Acute Care Choice:    Choice offered to:     DME Arranged:    DME Agency:     HH Arranged:    HH Agency:     Status of Service:  In process, will continue to follow  If discussed at Long Length of Stay Meetings, dates discussed:    Additional Comments: 11/11/2016  Pt has been on trach collar for 48 hours.  Barrier to discharging back to SNF is for pt to have a cuffless trach - attending will determine when this is appropriate  11/10/16 Discussed in LOS 11/10/16:  Remains appropriate for continued stay  11/08/16 CM informed by PCCM PA that pt will most likely wean quickly from ventilator and therefore discharge plan is for SNF that are TC capable.  Pt is not eligible for LTACH due to insurance. CSW actively following for placement Cherylann ParrClaxton, [name], RN 11/11/2016, 3:02 PM

## 2016-11-11 NOTE — Progress Notes (Signed)
[place] TEAM 1 - Stepdown/ICU TEAM  Dale [name]  WYO:[id number] DOB: [birth year] DOA: 10/24/2016 PCP: No PCP Per Patient    Brief Narrative:  50 yo M w/ severe neurological deficits after meningitis.  He was brought to the ED with decreased level of consciousness and nausea/vomiting/diarrhea. A CXR showed consolidation of the left upper lobe. He was intubated and transferred to [facility].  Significant Events: 11/26 Intubated (endotracheally) at [facility]SH ED - transferred to [facility]  11/29 bronch - left inspissated pus  Subjective: Pt is non-communicative.  He does not appear to be in any acute resp distress or uncontrolled pain.    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 Mental status appears to be at his baseline   Transient Atrial fibrillation In sinus tachycardia this morning - follow on tele   Chronic systolic CHF EF 77% Aug 4128 - no evidence of signif volume overload at this time  Filed Weights   11/09/16 0500 11/10/16 0500 11/11/16 0428  Weight: 76.2 kg (167 lb 15.9 oz) 76.5 kg (168 lb 10.4 oz) 76 kg (167 lb 8.8 oz)    Adrenal insufficiency - Autonomic dysreflexia Continuing Florinef and Hydrocortisone   Hypernatremia Resolved w/ free water via tube   Hypokalemia Supplement and follow   Hypomagnesemia Corrected   Severe malnutrition in context of chronic illness Continue tube feeds per PEG   DVT prophylaxis: SQ heparin  Code Status: FULL CODE Family Communication: no family present at time of exam  Disposition Plan: SDU - eventual trach capable SNF   Consultants:  PCCM  Antimicrobials:  Vanc 11/26 > 11/29 Zosyn  11/26 > 12/1 Cipro 12/1 >   Objective: Blood pressure (!) 137/94, pulse (!) 101, temperature 98.4 F (36.9 C), temperature source Oral, resp. rate (!) 25, height '5\' 8"'$  (1.727 m), weight 76 kg (167 lb 8.8 oz), SpO2 97 %.  Intake/Output Summary (Last 24 hours) at 11/11/16 0908 Last data filed at 11/11/16 0800  Gross per 24 hour  Intake             1650 ml  Output             1245 ml  Net              405 ml   Filed Weights   11/09/16 0500 11/10/16 0500 11/11/16 0428  Weight: 76.2 kg (167 lb 15.9 oz) 76.5 kg (168 lb 10.4 oz) 76 kg (167 lb 8.8 oz)    Examination: General: No acute respiratory distress evident  Lungs: Clear to auscultation bilaterally without wheezes or crackles Cardiovascular: Tachycardia w/o appreciable M  Abdomen: Nondistended, soft, bowel sounds positive, no ascites, no appreciable mass - PEG insertion clean and dry  Extremities: No significant cyanosis or clubbing - trace edema bilateral lower extremities  CBC:  Recent Labs Lab 11/07/16 0402 11/08/16 0418 11/09/16 0553 11/10/16 0612 11/11/16 0440  WBC 13.1* 8.7 12.6* 11.7* 11.1*  NEUTROABS  --   --  10.6* 9.2* 9.2*  HGB 8.8* 7.9* 8.8* 9.0* 8.4*  HCT 28.0* 24.9* 27.5* 28.1* 26.6*  MCV 95.2 94.3 93.5 94.0 93.3  PLT  177 193 217 247 440   Basic Metabolic Panel:  Recent Labs Lab 11/06/16 0320 11/07/16 0402 11/08/16 0418 11/09/16 0553 11/10/16 0612 11/11/16 0440 11/11/16 0441  NA 148* 146* 146* 144 144  --  145  K 3.3* 4.1 2.9* 3.2* 3.4*  --  3.4*  CL 108 108 106 105 106  --  104  CO2 '29 29 31 29 29  '$ --  30  GLUCOSE 88 88 88 114* 95  --  111*  BUN '17 17 13 9 9  '$ --  8  CREATININE 0.75 0.83 0.66 0.63 0.63  --  0.59*  CALCIUM 7.8* 7.9* 8.0* 7.7* 7.7*  --  8.0*  MG 1.8 1.8  --  1.5* 1.9 1.8  --   PHOS 4.1 4.4  --  4.4 5.0*  --  5.5*   GFR: Estimated Creatinine Clearance: 104.5 mL/min (by C-G formula based on SCr of 0.59 mg/dL (L)).  Liver Function Tests:  Recent Labs Lab 11/09/16 0553  11/10/16 0612 11/11/16 0441  ALBUMIN 1.3* 1.4* 1.4*    HbA1C: Hgb A1c MFr Bld  Date/Time Value Ref Range Status  05/20/2016 04:20 AM 5.3 4.8 - 5.6 % Final    Comment:    (NOTE)         Pre-diabetes: 5.7 - 6.4         Diabetes: >6.4         Glycemic control for adults with diabetes: <7.0   03/27/2016 05:35 AM 5.8 (H) 4.8 - 5.6 % Final    Comment:    (NOTE)         Pre-diabetes: 5.7 - 6.4         Diabetes: >6.4         Glycemic control for adults with diabetes: <7.0     CBG:  Recent Labs Lab 11/29/2016 1635 11/20/2016 2018 11/08/16 0216  GLUCAP 87 95 76    Recent Results (from the past 240 hour(s))  Culture, bal-quantitative     Status: Abnormal   Collection Time: 11/02/16 11:01 AM  Result Value Ref Range Status   Specimen Description BRONCHIAL ALVEOLAR LAVAGE  Final   Special Requests NONE  Final   Gram Stain   Final    RARE WBC PRESENT,BOTH PMN AND MONONUCLEAR NO ORGANISMS SEEN    Culture 60,000 COLONIES/mL ENTEROBACTER CLOACAE (A)  Final   Report Status 11/27/2016 FINAL  Final   Organism ID, Bacteria ENTEROBACTER CLOACAE (A)  Final      Susceptibility   Enterobacter cloacae - MIC*    CEFAZOLIN >=64 RESISTANT Resistant     CEFEPIME 2 SENSITIVE Sensitive     CEFTAZIDIME >=64 RESISTANT Resistant     CEFTRIAXONE >=64 RESISTANT Resistant     CIPROFLOXACIN 1 SENSITIVE Sensitive     GENTAMICIN 8 INTERMEDIATE Intermediate     IMIPENEM 0.5 SENSITIVE Sensitive     TRIMETH/SULFA <=20 SENSITIVE Sensitive     PIP/TAZO >=128 RESISTANT Resistant     * 60,000 COLONIES/mL ENTEROBACTER CLOACAE  Surgical PCR screen     Status: Abnormal   Collection Time: 11/03/16  7:35 AM  Result Value Ref Range Status   MRSA, PCR NEGATIVE NEGATIVE Final   Staphylococcus aureus POSITIVE (A) NEGATIVE Final    Comment:        The Xpert SA Assay (FDA approved for NASAL specimens in patients over 50 years of age), is one component of a comprehensive surveillance program.  Test performance  has been validated by [facility] for patients  greater than or equal to 27 year old. It is not intended to diagnose infection nor to guide or monitor treatment.   C difficile quick scan w PCR reflex     Status: None   Collection Time: 12/01/2016 10:00 AM  Result Value Ref Range Status   C Diff antigen NEGATIVE NEGATIVE Final   C Diff toxin NEGATIVE NEGATIVE Final   C Diff interpretation No C. difficile detected.  Final     Scheduled Meds: . atorvastatin  20 mg Per Tube q1800  . budesonide (PULMICORT) nebulizer solution  0.5 mg Nebulization BID  . chlorhexidine gluconate (MEDLINE KIT)  15 mL Mouth Rinse BID  . ciprofloxacin  750 mg Oral BID  . clonazepam  0.25 mg Per Tube Q6H  . famotidine  20 mg Per Tube BID  . fludrocortisone  0.1 mg Per Tube Daily  . folic acid  1 mg Per Tube Daily  . free water  200 mL Per Tube Q4H  . heparin  5,000 Units Subcutaneous Q8H  . hydrocortisone  10 mg Per Tube Daily  . hydrocortisone  20 mg Per Tube QHS  . ipratropium-albuterol  3 mL Nebulization QID  . levETIRAcetam  1,500 mg Per Tube BID  . mouth rinse  15 mL Mouth Rinse 10 times per day  . QUEtiapine  50 mg Oral BID  . sodium chloride  500 mL Intravenous Once  . thiamine  100 mg Per Tube Daily  . Valproate Sodium  750 mg Per Tube Q8H   Continuous Infusions: . feeding supplement (VITAL AF 1.2 CAL) 1,000 mL (11/10/16 1613)  . fentaNYL infusion INTRAVENOUS Stopped (11/09/16 0745)     LOS: 12 days   Cherene Altes, MD [facility] Office  [phone] Pager - Text Page per Amion as per below:  On-Call/Text Page:      [email or web address]      password [email or web address]  If 7PM-7AM, please contact night-coverage [email or web address] Password [email or web address] 11/11/2016, 9:08 AM

## 2016-11-12 LAB — GLUCOSE, CAPILLARY
GLUCOSE-CAPILLARY: 93 mg/dL (ref 65–99)
GLUCOSE-CAPILLARY: 94 mg/dL (ref 65–99)
Glucose-Capillary: 71 mg/dL (ref 65–99)
Glucose-Capillary: 86 mg/dL (ref 65–99)
Glucose-Capillary: 93 mg/dL (ref 65–99)
Glucose-Capillary: 96 mg/dL (ref 65–99)

## 2016-11-12 LAB — COMPREHENSIVE METABOLIC PANEL
ALBUMIN: 1.5 g/dL — AB (ref 3.5–5.0)
ALT: 27 U/L (ref 17–63)
ANION GAP: 11 (ref 5–15)
AST: 21 U/L (ref 15–41)
Alkaline Phosphatase: 51 U/L (ref 38–126)
BUN: 8 mg/dL (ref 6–20)
CHLORIDE: 104 mmol/L (ref 101–111)
CO2: 31 mmol/L (ref 22–32)
Calcium: 8.3 mg/dL — ABNORMAL LOW (ref 8.9–10.3)
Creatinine, Ser: 0.63 mg/dL (ref 0.61–1.24)
GFR calc non Af Amer: >60 mL/min (ref >60)
GLUCOSE: 99 mg/dL (ref 65–99)
Potassium: 3.5 mmol/L (ref 3.5–5.1)
SODIUM: 146 mmol/L — AB (ref 135–145)
Total Bilirubin: 0.4 mg/dL (ref 0.3–1.2)
Total Protein: 5.4 g/dL — ABNORMAL LOW (ref 6.5–8.1)

## 2016-11-12 LAB — CBC
HCT: 41.2 % (ref 39.0–52.0)
HEMOGLOBIN: 13 g/dL (ref 13.0–17.0)
MCH: 29.1 pg (ref 26.0–34.0)
MCHC: 31.6 g/dL (ref 30.0–36.0)
MCV: 92.2 fL (ref 78.0–100.0)
Platelets: 239 10*3/uL (ref 150–400)
RBC: 4.47 MIL/uL (ref 4.22–5.81)
RDW: 17.4 % — ABNORMAL HIGH (ref 11.5–15.5)
WBC: 8.1 10*3/uL (ref 4.0–10.5)

## 2016-11-12 MED ORDER — IPRATROPIUM BROMIDE 0.02 % IN SOLN
0.5000 mg | Freq: Four times a day (QID) | RESPIRATORY_TRACT | Status: DC
  Administered 2016-11-12 – 2016-12-07 (×99): 0.5 mg via RESPIRATORY_TRACT
  Filled 2016-11-12 (×99): qty 2.5

## 2016-11-12 MED ORDER — LEVALBUTEROL HCL 0.63 MG/3ML IN NEBU
0.6300 mg | INHALATION_SOLUTION | Freq: Four times a day (QID) | RESPIRATORY_TRACT | Status: DC
  Administered 2016-11-12 – 2016-12-07 (×99): 0.63 mg via RESPIRATORY_TRACT
  Filled 2016-11-12 (×98): qty 3

## 2016-11-12 MED ORDER — ORAL CARE MOUTH RINSE
15.0000 mL | Freq: Four times a day (QID) | OROMUCOSAL | Status: DC
  Administered 2016-11-12 (×4): 15 mL via OROMUCOSAL

## 2016-11-12 MED ORDER — FENTANYL CITRATE (PF) 100 MCG/2ML IJ SOLN
25.0000 ug | INTRAMUSCULAR | Status: DC | PRN
  Administered 2016-11-17: 50 ug via INTRAVENOUS
  Administered 2016-11-17: 100 ug via INTRAVENOUS
  Administered 2016-11-18: 75 ug via INTRAVENOUS
  Administered 2016-11-19 (×3): 50 ug via INTRAVENOUS
  Administered 2016-11-19: 100 ug via INTRAVENOUS
  Administered 2016-11-20: 50 ug via INTRAVENOUS
  Administered 2016-11-20 – 2016-11-22 (×13): 100 ug via INTRAVENOUS
  Filled 2016-11-12 (×21): qty 2

## 2016-11-12 MED ORDER — FREE WATER
300.0000 mL | Status: DC
  Administered 2016-11-12 – 2016-11-14 (×11): 300 mL

## 2016-11-12 NOTE — Progress Notes (Signed)
[place] TEAM 1 - Stepdown/ICU TEAM  Dale [name]  HBZ:[id number] DOB: May 26, 1966 DOA: 10/10/2016 PCP: No PCP Per Patient    Brief Narrative:  49 yo M w/ severe neurological deficits after meningitis.  He was brought to the ED with decreased level of consciousness and nausea/vomiting/diarrhea. A CXR showed consolidation of the left upper lobe. He was intubated and transferred to [facility].  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 Mental status appears to be at his baseline   Transient Atrial fibrillation In sinus tachycardia again today - follow on tele   Chronic systolic CHF EF 10% Aug 1751 - no evidence of volume overload at this time  [facility] Weights   11/10/16 0500 11/11/16 0428 11/12/16 0316  Weight: 76.5 kg (168 lb 10.4 oz) 76 kg (167 lb 8.8 oz) 74.4 kg (164 lb 1.6 oz)    Adrenal insufficiency - Autonomic dysreflexia Continuing Florinef and Hydrocortisone   Hypernatremia Recurring - increase free water via tube   Hypokalemia Cont to supplement and follow   Hypomagnesemia Corrected   Severe malnutrition in context of chronic illness Continue tube feeds per PEG   DVT prophylaxis: SQ heparin  Code Status: FULL CODE Family Communication: no family present at time of exam  Disposition Plan: SDU - eventual trach capable SNF   Consultants:  PCCM  Antimicrobials:  Vanc 11/26 > 11/29 Zosyn 11/26 > 12/1 Cipro 12/1 >   Objective: Blood pressure  127/89, pulse 99, temperature 97.9 F (36.6 C), temperature source Oral, resp. rate (!) 25, height '5\' 8"'$  (1.727 m), weight 74.4 kg (164 lb 1.6 oz), SpO2 98 %.  Intake/Output Summary (Last 24 hours) at 11/12/16 1521 Last data filed at 11/12/16 1213  Gross per 24 hour  Intake             1730 ml  Output             3750 ml  Net            -2020 ml   Filed Weights   11/10/16 0500 11/11/16 0428 11/12/16 0316  Weight: 76.5 kg (168 lb 10.4 oz) 76 kg (167 lb 8.8 oz) 74.4 kg (164 lb 1.6 oz)    Examination: General: No acute respiratory distress - somewhat agitated - eyes open but does not communicate  Lungs: Clear to auscultation bilaterally without wheezing  Cardiovascular: Tachycardia w/o gallup or rub   Abdomen: Nondistended, soft, bowel sounds positive, no ascites, no appreciable mass  Extremities: Stable trace edema bilateral lower extremities  CBC:  Recent Labs Lab 11/08/16 0418 11/09/16 0553 11/10/16 0612 11/11/16 0440 11/12/16 0446  WBC 8.7 12.6* 11.7* 11.1* 8.1  NEUTROABS  --  10.6* 9.2* 9.2*  --   HGB 7.9* 8.8* 9.0* 8.4* 13.0  HCT 24.9* 27.5* 28.1* 26.6* 41.2  MCV 94.3 93.5 94.0 93.3 92.2  PLT 193 217 247 263 025   Basic Metabolic Panel:  Recent Labs Lab 11/06/16 0320  11/07/16 0402 11/08/16 0418 11/09/16 0553 11/10/16 0612 11/11/16 0440 11/11/16 0441 11/12/16 0446  NA 148* 146* 146* 144 144  --  145 146*  K 3.3* 4.1 2.9* 3.2* 3.4*  --  3.4* 3.5  CL 108 108 106 105 106  --  104 104  CO2 '29 29 31 29 29  '$ --  30 31  GLUCOSE 88 88 88 114* 95  --  111* 99  BUN '17 17 13 9 9  '$ --  8 8  CREATININE 0.75 0.83 0.66 0.63 0.63  --  0.59* 0.63  CALCIUM 7.8* 7.9* 8.0* 7.7* 7.7*  --  8.0* 8.3*  MG 1.8 1.8  --  1.5* 1.9 1.8  --   --   PHOS 4.1 4.4  --  4.4 5.0*  --  5.5*  --    GFR: Estimated Creatinine Clearance: 104.5 mL/min (by C-G formula based on SCr of 0.63 mg/dL).  Liver Function Tests:  Recent Labs Lab 11/09/16 0553 11/10/16 0612 11/11/16 0441  11/12/16 0446  AST  --   --   --  21  ALT  --   --   --  27  ALKPHOS  --   --   --  51  BILITOT  --   --   --  0.4  PROT  --   --   --  5.4*  ALBUMIN 1.3* 1.4* 1.4* 1.5*    HbA1C: Hgb A1c MFr Bld  Date/Time Value Ref Range Status  05/20/2016 04:20 AM 5.3 4.8 - 5.6 % Final    Comment:    (NOTE)         Pre-diabetes: 5.7 - 6.4         Diabetes: >6.4         Glycemic control for adults with diabetes: <7.0   03/27/2016 05:35 AM 5.8 (H) 4.8 - 5.6 % Final    Comment:    (NOTE)         Pre-diabetes: 5.7 - 6.4         Diabetes: >6.4         Glycemic control for adults with diabetes: <7.0     CBG:  Recent Labs Lab 11/11/16 2054 11/12/16 0046 11/12/16 0439 11/12/16 0932 11/12/16 1210  GLUCAP 77 96 86 71 93    Recent Results (from the past 240 hour(s))  Surgical PCR screen     Status: Abnormal   Collection Time: 11/03/16  7:35 AM  Result Value Ref Range Status   MRSA, PCR NEGATIVE NEGATIVE Final   Staphylococcus aureus POSITIVE (A) NEGATIVE Final    Comment:        The Xpert SA Assay (FDA approved for NASAL specimens in patients over 43 years of age), is one component of a comprehensive surveillance program.  Test performance has been validated by [facility] for patients greater than or equal to 2 year old. It is not intended to diagnose infection nor to guide or monitor treatment.   C difficile quick scan w PCR reflex     Status: None   Collection Time: 11/23/2016 10:00 AM  Result Value Ref Range Status   C Diff antigen NEGATIVE NEGATIVE Final   C Diff toxin NEGATIVE NEGATIVE Final   C Diff interpretation No C. difficile detected.  Final     Scheduled Meds: . atorvastatin  20 mg Per Tube q1800  . budesonide (PULMICORT) nebulizer solution  0.5 mg Nebulization BID  . chlorhexidine gluconate (MEDLINE KIT)  15 mL Mouth  Rinse BID  . clonazePAM  0.25 mg Per Tube Q6H  . famotidine  20 mg Per Tube BID  . fludrocortisone  0.1 mg Per Tube Daily  . folic acid  1  mg Per Tube Daily  . free water  200 mL Per Tube Q4H  . heparin  5,000 Units Subcutaneous Q8H  . hydrocortisone  10 mg Per Tube Daily  . hydrocortisone  20 mg Per Tube QHS  . ipratropium-albuterol  3 mL Nebulization QID  . levETIRAcetam  1,500 mg Per Tube BID  . mouth rinse  15 mL Mouth Rinse QID  . QUEtiapine  50 mg Per Tube BID  . thiamine  100 mg Per Tube Daily  . Valproate Sodium  750 mg Per Tube Q8H   Continuous Infusions: . feeding supplement (VITAL AF 1.2 CAL) 1,000 mL (11/12/16 0548)     LOS: 13 days   Cherene Altes, MD [facility] Office  [phone] Pager - Text Page per Amion as per below:  On-Call/Text Page:      [email or web address]      password [email or web address]  If 7PM-7AM, please contact night-coverage [email or web address] Password [email or web address] 11/12/2016, 3:21 PM

## 2016-11-12 NOTE — Plan of Care (Signed)
Problem: Activity: Goal: Ability to tolerate increased activity will improve Outcome: Progressing Preformed logroll and some range of motion with upper extremities during turns no teach back displayed by the patient at this time.

## 2016-11-13 LAB — CBC
HCT: 27.3 % — ABNORMAL LOW (ref 39.0–52.0)
Hemoglobin: 8.7 g/dL — ABNORMAL LOW (ref 13.0–17.0)
MCH: 29.3 pg (ref 26.0–34.0)
MCHC: 31.9 g/dL (ref 30.0–36.0)
MCV: 91.9 fL (ref 78.0–100.0)
PLATELETS: 344 10*3/uL (ref 150–400)
RBC: 2.97 MIL/uL — ABNORMAL LOW (ref 4.22–5.81)
RDW: 17.8 % — AB (ref 11.5–15.5)
WBC: 14.3 10*3/uL — ABNORMAL HIGH (ref 4.0–10.5)

## 2016-11-13 LAB — BASIC METABOLIC PANEL
Anion gap: 12 (ref 5–15)
BUN: 9 mg/dL (ref 6–20)
CHLORIDE: 101 mmol/L (ref 101–111)
CO2: 29 mmol/L (ref 22–32)
CREATININE: 0.71 mg/dL (ref 0.61–1.24)
Calcium: 8.1 mg/dL — ABNORMAL LOW (ref 8.9–10.3)
GFR calc Af Amer: >60 mL/min (ref >60)
GFR calc non Af Amer: >60 mL/min (ref >60)
Glucose, Bld: 104 mg/dL — ABNORMAL HIGH (ref 65–99)
Potassium: 3.2 mmol/L — ABNORMAL LOW (ref 3.5–5.1)
Sodium: 142 mmol/L (ref 135–145)

## 2016-11-13 LAB — GLUCOSE, CAPILLARY
GLUCOSE-CAPILLARY: 105 mg/dL — AB (ref 65–99)
GLUCOSE-CAPILLARY: 113 mg/dL — AB (ref 65–99)
GLUCOSE-CAPILLARY: 116 mg/dL — AB (ref 65–99)
Glucose-Capillary: 103 mg/dL — ABNORMAL HIGH (ref 65–99)
Glucose-Capillary: 90 mg/dL (ref 65–99)

## 2016-11-13 MED ORDER — METOPROLOL TARTRATE 25 MG/10 ML ORAL SUSPENSION
12.5000 mg | Freq: Two times a day (BID) | ORAL | Status: DC
  Administered 2016-11-13 – 2016-11-18 (×12): 12.5 mg
  Filled 2016-11-13 (×12): qty 10

## 2016-11-13 MED ORDER — ORAL CARE MOUTH RINSE
15.0000 mL | Freq: Two times a day (BID) | OROMUCOSAL | Status: DC
  Administered 2016-11-13 – 2016-11-21 (×17): 15 mL via OROMUCOSAL

## 2016-11-13 MED ORDER — POTASSIUM CHLORIDE 20 MEQ/15ML (10%) PO SOLN
40.0000 meq | Freq: Two times a day (BID) | ORAL | Status: AC
  Administered 2016-11-13 – 2016-11-14 (×4): 40 meq
  Filled 2016-11-13 (×4): qty 30

## 2016-11-13 NOTE — Progress Notes (Signed)
[place] TEAM 1 - Stepdown/ICU TEAM  LENNELL SHANKS  EZV:[id number] DOB: Apr 12, 1966 DOA: 10/27/2016 PCP: No PCP Per Patient    Brief Narrative:  50 yo M w/ severe neurological deficits after meningitis.  He was brought to the ED with decreased level of consciousness and nausea/vomiting/diarrhea. A CXR showed consolidation of the left upper lobe. He was intubated and transferred to [facility].  Significant Events: 11/26 Intubated (endotracheally) at [facility] ED - transferred to [facility]  11/29 bronch - left inspissated pus  Subjective: The patient is noncommunicative.  He does not appear to be in acute respiratory distress or uncontrolled pain.  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 Mental status appears to be at his baseline   Transient Atrial fibrillation sinus tachycardia is improving  Chronic systolic CHF EF 40% Aug 2017 - no evidence of volume overload at this time  [facility] Med Weights   11/11/16 0428 11/12/16 0316 11/13/16 0500  Weight: 76 kg (167 lb 8.8 oz) 74.4 kg (164 lb 1.6 oz) 70.7 kg (155 lb 12.8 oz)    Adrenal insufficiency - Autonomic dysreflexia Continuing Florinef and Hydrocortisone   Hypernatremia Resolved with increase in free water via tube   Hypokalemia Cont to supplement and follow - goal is 4.0  Hypomagnesemia Corrected - recheck in a.m.  Severe malnutrition in context of chronic illness Continue tube feeds per PEG   DVT prophylaxis: SQ heparin  Code Status: FULL CODE Family Communication: no family present at time of exam  Disposition Plan: SDU - eventual trach  capable SNF   Consultants:  PCCM  Antimicrobials:  Vanc 11/26 > 11/29 Zosyn 11/26 > 12/1 Cipro 12/1 > 12/8  Objective: Blood pressure (!) 126/95, pulse 100, temperature 97.5 F (36.4 C), temperature source Axillary, resp. rate (!) 24, height 5\' 8"  (1.727 m), weight 70.7 kg (155 lb 12.8 oz), SpO2 96 %.  Intake/Output Summary (Last 24 hours) at 11/13/16 1324 Last data filed at 11/13/16 0800  Gross per 24 hour  Intake             2825 ml  Output              925 ml  Net             1900 ml   Filed Weights   11/11/16 0428 11/12/16 0316 11/13/16 0500  Weight: 76 kg (167 lb 8.8 oz) 74.4 kg (164 lb 1.6 oz) 70.7 kg (155 lb 12.8 oz)    Examination: General: No acute respiratory distress  Lungs: Clear to auscultation bilaterally Cardiovascular: Tachycardia w/o gallup or rub   Abdomen: Nondistended, soft, bowel sounds positive Extremities: Stable trace edema B LE   CBC:  Recent Labs Lab 11/09/16 0553 11/10/16 0612 11/11/16 0440 11/12/16 0446 11/13/16 0235  WBC 12.6* 11.7* 11.1* 8.1 14.3*  NEUTROABS 10.6* 9.2* 9.2*  --   --   HGB 8.8* 9.0* 8.4* 13.0 8.7*  HCT 27.5* 28.1* 26.6* 41.2 27.3*  MCV 93.5 94.0 93.3 92.2 91.9  PLT 217 247 263 239 344   Basic Metabolic Panel:  Recent Labs Lab  11/07/16 0402  11/09/16 0553 11/10/16 0612 11/11/16 0440 11/11/16 0441 11/12/16 0446 11/13/16 0235  NA 146*  < > 144 144  --  145 146* 142  K 4.1  < > 3.2* 3.4*  --  3.4* 3.5 3.2*  CL 108  < > 105 106  --  104 104 101  CO2 29  < > 29 29  --  '30 31 29  '$ GLUCOSE 88  < > 114* 95  --  111* 99 104*  BUN 17  < > 9 9  --  '8 8 9  '$ CREATININE 0.83  < > 0.63 0.63  --  0.59* 0.63 0.71  CALCIUM 7.9*  < > 7.7* 7.7*  --  8.0* 8.3* 8.1*  MG 1.8  --  1.5* 1.9 1.8  --   --   --   PHOS 4.4  --  4.4 5.0*  --  5.5*  --   --   < > = values in this interval not displayed. GFR: Estimated Creatinine Clearance: 104.5 mL/min (by C-G formula based on SCr of 0.71 mg/dL).  Liver Function Tests:  Recent  Labs Lab 11/09/16 0553 11/10/16 0612 11/11/16 0441 11/12/16 0446  AST  --   --   --  21  ALT  --   --   --  27  ALKPHOS  --   --   --  51  BILITOT  --   --   --  0.4  PROT  --   --   --  5.4*  ALBUMIN 1.3* 1.4* 1.4* 1.5*    HbA1C: Hgb A1c MFr Bld  Date/Time Value Ref Range Status  05/20/2016 04:20 AM 5.3 4.8 - 5.6 % Final    Comment:    (NOTE)         Pre-diabetes: 5.7 - 6.4         Diabetes: >6.4         Glycemic control for adults with diabetes: <7.0   03/27/2016 05:35 AM 5.8 (H) 4.8 - 5.6 % Final    Comment:    (NOTE)         Pre-diabetes: 5.7 - 6.4         Diabetes: >6.4         Glycemic control for adults with diabetes: <7.0     CBG:  Recent Labs Lab 11/12/16 1543 11/12/16 2009 11/13/16 0001 11/13/16 0432 11/13/16 0746  GLUCAP 93 94 103* 90 105*    Recent Results (from the past 240 hour(s))  C difficile quick scan w PCR reflex     Status: None   Collection Time: 11/21/2016 10:00 AM  Result Value Ref Range Status   C Diff antigen NEGATIVE NEGATIVE Final   C Diff toxin NEGATIVE NEGATIVE Final   C Diff interpretation No C. difficile detected.  Final     Scheduled Meds: . atorvastatin  20 mg Per Tube q1800  . budesonide (PULMICORT) nebulizer solution  0.5 mg Nebulization BID  . chlorhexidine gluconate (MEDLINE KIT)  15 mL Mouth Rinse BID  . clonazePAM  0.25 mg Per Tube Q6H  . famotidine  20 mg Per Tube BID  . fludrocortisone  0.1 mg Per Tube Daily  . folic acid  1 mg Per Tube Daily  . free water  300 mL Per Tube Q4H  . heparin  5,000 Units Subcutaneous Q8H  . hydrocortisone  10 mg Per Tube Daily  . hydrocortisone  20 mg Per Tube QHS  . ipratropium  0.5 mg Nebulization QID  . levalbuterol  0.63 mg Nebulization QID  . levETIRAcetam  1,500 mg Per Tube BID  . mouth rinse  15 mL Mouth Rinse BID  . metoprolol tartrate  12.5 mg Per Tube BID  . QUEtiapine  50 mg Per Tube BID  . thiamine  100 mg Per Tube Daily  . Valproate Sodium  750 mg Per Tube Q8H    Continuous Infusions: . feeding supplement (VITAL AF 1.2 CAL) 1,000 mL (11/13/16 0550)     LOS: 14 days   Cherene Altes, MD [facility] Office  [phone] Pager - Text Page per Amion as per below:  On-Call/Text Page:      [email or web address]      password [email or web address]  If 7PM-7AM, please contact night-coverage [email or web address] Password [email or web address] 11/13/2016, 1:24 PM

## 2016-11-13 NOTE — Plan of Care (Signed)
Problem: Respiratory: Goal: Ability to maintain a clear airway and adequate ventilation will improve Outcome: Progressing Suctioned patient and changed pulse oximetry to opposite ear with patient tolerating unable to to teach back at this time due to patient's neurological status

## 2016-11-14 DIAGNOSIS — R000 Tachycardia, unspecified: Secondary | ICD-10-CM

## 2016-11-14 LAB — BASIC METABOLIC PANEL
Anion gap: 10 (ref 5–15)
BUN: 10 mg/dL (ref 6–20)
CHLORIDE: 104 mmol/L (ref 101–111)
CO2: 28 mmol/L (ref 22–32)
CREATININE: 0.63 mg/dL (ref 0.61–1.24)
Calcium: 8.2 mg/dL — ABNORMAL LOW (ref 8.9–10.3)
GFR calc Af Amer: >60 mL/min (ref >60)
GFR calc non Af Amer: >60 mL/min (ref >60)
Glucose, Bld: 116 mg/dL — ABNORMAL HIGH (ref 65–99)
POTASSIUM: 3.4 mmol/L — AB (ref 3.5–5.1)
Sodium: 142 mmol/L (ref 135–145)

## 2016-11-14 LAB — GLUCOSE, CAPILLARY
GLUCOSE-CAPILLARY: 96 mg/dL (ref 65–99)
GLUCOSE-CAPILLARY: 106 mg/dL — AB (ref 65–99)
GLUCOSE-CAPILLARY: 91 mg/dL (ref 65–99)
Glucose-Capillary: 108 mg/dL — ABNORMAL HIGH (ref 65–99)
Glucose-Capillary: 95 mg/dL (ref 65–99)
Glucose-Capillary: 101 mg/dL — ABNORMAL HIGH (ref 65–99)

## 2016-11-14 LAB — MAGNESIUM: Magnesium: 1.5 mg/dL — ABNORMAL LOW (ref 1.7–2.4)

## 2016-11-14 MED ORDER — MAGNESIUM OXIDE 400 (241.3 MG) MG PO TABS
400.0000 mg | ORAL_TABLET | Freq: Two times a day (BID) | ORAL | Status: DC
  Administered 2016-11-14 – 2016-11-15 (×3): 400 mg
  Filled 2016-11-14 (×3): qty 1

## 2016-11-14 MED ORDER — HYDROCORTISONE 10 MG PO TABS
10.0000 mg | ORAL_TABLET | Freq: Every day | ORAL | Status: DC
  Administered 2016-11-14 – 2016-12-15 (×32): 10 mg
  Filled 2016-11-14 (×34): qty 1

## 2016-11-14 MED ORDER — FREE WATER
200.0000 mL | Status: DC
  Administered 2016-11-14 – 2016-12-03 (×110): 200 mL

## 2016-11-14 MED ORDER — HYDROCORTISONE 5 MG PO TABS
5.0000 mg | ORAL_TABLET | Freq: Every day | ORAL | Status: DC
  Administered 2016-11-15 – 2016-12-16 (×32): 5 mg
  Filled 2016-11-14 (×33): qty 1

## 2016-11-14 NOTE — Progress Notes (Signed)
CSW continuing to follow for [facility]- pt will need to be stable at 28% FiO2 for 48 hours, suctioning q4 or less, and trach changed to cuffless.  Burna SisJenna H. Modesta Sammons, LCSW Clinical Social Worker [phone]

## 2016-11-14 NOTE — Plan of Care (Signed)
Problem: Respiratory: Goal: Ability to maintain a clear airway and adequate ventilation will improve Outcome: Progressing Placed foam dressing under trach to protect skin from secretions and split gauze placed over his peg tube site even though warm wash clothes used at times to remove previous dried drainage; no teach able to be done with patient at this time

## 2016-11-14 NOTE — Progress Notes (Signed)
[place] TEAM 1 - Stepdown/ICU TEAM  TRACEY STEWART  ZOX:[id number] DOB: August 19, 1966 DOA: 10/14/2016 PCP: No PCP Per Patient    Brief Narrative:  50 yo M w/ severe neurological deficits after meningitis.  He was brought to the ED with decreased level of consciousness and nausea/vomiting/diarrhea. A CXR showed consolidation of the left upper lobe. He was intubated and transferred to [facility].  Significant Events: 11/26 Intubated (endotracheally) at [facility] ED - transferred to [facility]  11/29 bronch - left inspissated pus  Subjective: No changes evident in patient's condition.  There is no evidence of distress or uncontrolled pain.  Assessment & Plan:  Acute hypoxic respiratory failure secondary to Enterobacter HCAP w/ ARDS and Sepsis  Status post tracheostomy for the third time - trach care per PCCM with no plans to decannulate - sepsis and acute infection appear to have cleared - tolerating trach collar  Enterobacter cloacae left lung pneumonia w/ extensive infiltrate and atx of L lung Has completed a full course of antibiotic therapy - follow-up chest x-ray in a.m.  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 fib of late  Chronic systolic CHF EF 81% Aug 1914 - no evidence of volume overload at this time - weight is stable Filed Weights   11/13/16 0500 11/13/16 2354 11/14/16 0440  Weight: 70.7 kg (155 lb 12.8 oz) 70.8 kg (156 lb) 67.9 kg (149 lb 12.8 oz)    Adrenal insufficiency - Autonomic dysreflexia Continuing Florinef and Hydrocortisone   Hypernatremia Resolved with increase in free water via tube   Hypokalemia Cont to supplement and follow - goal is 4.0  Hypomagnesemia Increased replacement and follow  intermittently  Severe malnutrition in context of chronic illness Continue tube feeds per PEG   DVT prophylaxis: SQ heparin  Code Status: FULL CODE Family Communication: no family present at time of exam  Disposition Plan: SDU - eventual trach capable SNF (appears stable for placement at this time)  Consultants:  PCCM  Antimicrobials:  Vanc 11/26 > 11/29 Zosyn 11/26 > 12/1 Cipro 12/1 > 12/8  Objective: Blood pressure (!) 156/100, pulse 96, temperature 97.5 F (36.4 C), temperature source Axillary, resp. rate (!) 23, height _0  (1.727 m), weight 67.9 kg (149 lb 12.8 oz), SpO2 100 %.  Intake/Output Summary (Last 24 hours) at 11/14/16 1150 Last data filed at 11/14/16 0300  Gross per 24 hour  Intake             2435 ml  Output             2900 ml  Net             -465 ml   Filed Weights   11/13/16 0500 11/13/16 2354 11/14/16 0440  Weight: 70.7 kg (155 lb 12.8 oz) 70.8 kg (156 lb) 67.9 kg (149 lb 12.8 oz)    Examination: General: No acute respiratory distress evident Lungs: Clear to auscultation bilaterally on trach collar Cardiovascular: Regular rate without appreciable murmur or gallop  Abdomen: Nondistended, soft, bowel sounds positive - PEG insertion clean and dry Extremities: Stable trace edema B LE   CBC:  Recent Labs Lab 11/09/16 0553 11/10/16 0612 11/11/16 0440 11/12/16 0446 11/13/16 0235  WBC 12.6* 11.7* 11.1*  8.1 14.3*  NEUTROABS 10.6* 9.2* 9.2*  --   --   HGB 8.8* 9.0* 8.4* 13.0 8.7*  HCT 27.5* 28.1* 26.6* 41.2 27.3*  MCV 93.5 94.0 93.3 92.2 91.9  PLT 217 247 263 239 093   Basic Metabolic Panel:  Recent Labs Lab 11/09/16 0553 11/10/16 0612 11/11/16 0440 11/11/16 0441 11/12/16 0446 11/13/16 0235 11/14/16 0311  NA 144 144  --  145 146* 142 142  K 3.2* 3.4*  --  3.4* 3.5 3.2* 3.4*  CL 105 106  --  104 104 101 104  CO2 29 29  --  _0 GLUCOSE 114* 95  --  111* 99 104* 116*  BUN 9 9  --  _1 CREATININE 0.63 0.63  --  0.59*  0.63 0.71 0.63  CALCIUM 7.7* 7.7*  --  8.0* 8.3* 8.1* 8.2*  MG 1.5* 1.9 1.8  --   --   --  1.5*  PHOS 4.4 5.0*  --  5.5*  --   --   --    GFR: Estimated Creatinine Clearance: 103.7 mL/min (by C-G formula based on SCr of 0.63 mg/dL).  Liver Function Tests:  Recent Labs Lab 11/09/16 0553 11/10/16 0612 11/11/16 0441 11/12/16 0446  AST  --   --   --  21  ALT  --   --   --  27  ALKPHOS  --   --   --  51  BILITOT  --   --   --  0.4  PROT  --   --   --  5.4*  ALBUMIN 1.3* 1.4* 1.4* 1.5*    HbA1C: Hgb A1c MFr Bld  Date/Time Value Ref Range Status  05/20/2016 04:20 AM 5.3 4.8 - 5.6 % Final    Comment:    (NOTE)         Pre-diabetes: 5.7 - 6.4         Diabetes: >6.4         Glycemic control for adults with diabetes: <7.0   03/27/2016 05:35 AM 5.8 (H) 4.8 - 5.6 % Final    Comment:    (NOTE)         Pre-diabetes: 5.7 - 6.4         Diabetes: >6.4         Glycemic control for adults with diabetes: <7.0     CBG:  Recent Labs Lab 11/13/16 0746 11/13/16 2027 11/13/16 2356 11/14/16 0503 11/14/16 0759  GLUCAP 105* 116* 113* 108* 96    No results found for this or any previous visit (from the past 240 hour(s)).   Scheduled Meds: . atorvastatin  20 mg Per Tube q1800  . budesonide (PULMICORT) nebulizer solution  0.5 mg Nebulization BID  . chlorhexidine gluconate (MEDLINE KIT)  15 mL Mouth Rinse BID  . clonazePAM  0.25 mg Per Tube Q6H  . famotidine  20 mg Per Tube BID  . fludrocortisone  0.1 mg Per Tube Daily  . folic acid  1 mg Per Tube Daily  . free water  300 mL Per Tube Q4H  . heparin  5,000 Units Subcutaneous Q8H  . hydrocortisone  10 mg Per Tube Daily  . hydrocortisone  20 mg Per Tube QHS  . ipratropium  0.5 mg Nebulization QID  . levalbuterol  0.63 mg Nebulization QID  . levETIRAcetam  1,500 mg Per Tube BID  . mouth rinse  15 mL Mouth Rinse BID  . metoprolol tartrate  12.5 mg Per Tube BID  . potassium chloride  40 mEq Per Tube BID  . QUEtiapine  50 mg Per  Tube BID  . thiamine  100 mg Per Tube Daily  . Valproate Sodium  750 mg Per Tube Q8H   Continuous Infusions: . feeding supplement (VITAL AF 1.2 CAL) 1,000 mL (11/14/16 0315)     LOS: 15 days   Cherene Altes, MD [facility] Office  [phone] Pager - Text Page per Amion as per below:  On-Call/Text Page:      [email or web address]      password [email or web address]  If 7PM-7AM, please contact night-coverage [email or web address] Password [email or web address] 11/14/2016, 11:50 AM

## 2016-11-14 NOTE — Progress Notes (Signed)
PULMONARY / CRITICAL CARE MEDICINE   Name: Dale [name] MRN: [id number] DOB: 05/18/1966    ADMISSION DATE:  10/10/2016  CHIEF COMPLAINT:  Nausea and Vomiting  50 yom w/ severe neurological deficits after meningitis.  He was brought to the ED with decreased level of consciousness and nausea/vomiting/diarrhea. A CXR showed consolidation of the left upper lobe. [facility] was intubated and [facility].   SUBJECTIVE: NAD on ATC  VITAL SIGNS: BP (!) 156/100   Pulse 96   Temp 97.5 F (36.4 C) (Axillary)   Resp (!) 23   Ht 5\' 8"  (1.727 m)   Wt 67.9 kg (149 lb 12.8 oz)   SpO2 100%   BMI 22.78 kg/m   HEMODYNAMICS:    VENTILATOR SETTINGS: FiO2 (%):  [30 %] 30 %  INTAKE / OUTPUT: I/O last 3 completed shifts: In: 4180 [NG/GT:4180] Out: 3000 [Urine:3000]  PHYSICAL EXAMINATION: General: Middle aged male , chronically ill appearing, calm, NAD   Neuro: CN grossly intact. (-) lateralizing signs.  Opens eyes to commands.  HEENT: [place]/AT. PERRL, sclerae anicteric. Cardiovascular: RRR, no M/R/G.  Lungs: Respirations even and unlabored on ATC, few scattered rhonchi Abdomen: BS x 4, soft, NT/ND.  Musculoskeletal: No gross deformities, no edema.  Skin: Intact, warm, no rashes.  LABS:  BMET  Recent Labs Lab 11/12/16 0446 11/13/16 0235 11/14/16 0311  NA 146* 142 142  K 3.5 3.2* 3.4*  CL 104 101 104  CO2 31 29 28   BUN 8 9 10   CREATININE 0.63 0.71 0.63  GLUCOSE 99 104* 116*    Electrolytes  Recent Labs Lab 11/09/16 0553 11/10/16 0612 11/11/16 0440 11/11/16 0441 11/12/16 0446 11/13/16 0235 11/14/16 0311  CALCIUM 7.7* 7.7*  --  8.0* 8.3* 8.1* 8.2*  MG 1.5* 1.9 1.8  --   --   --  1.5*  PHOS 4.4 5.0*  --  5.5*  --   --   --     CBC  Recent Labs Lab 11/11/16 0440 11/12/16 0446 11/13/16 0235  WBC 11.1* 8.1 14.3*  HGB 8.4* 13.0 8.7*  HCT 26.6* 41.2 27.3*  PLT 263 239 344    Coag's No results for input(s): APTT, INR in the last 168 hours.  Sepsis  Markers No results for input(s): LATICACIDVEN, PROCALCITON, O2SATVEN in the last 168 hours.  ABG No results for input(s): PHART, PCO2ART, PO2ART in the last 168 hours.  Liver Enzymes  Recent Labs Lab 11/10/16 0612 11/11/16 0441 11/12/16 0446  AST  --   --  21  ALT  --   --  27  ALKPHOS  --   --  51  BILITOT  --   --  0.4  ALBUMIN 1.4* 1.4* 1.5*    Cardiac Enzymes No results for input(s): TROPONINI, PROBNP in the last 168 hours.  Glucose  Recent Labs Lab 11/13/16 0432 11/13/16 0746 11/13/16 2027 11/13/16 2356 11/14/16 0503 11/14/16 0759  GLUCAP 90 105* 116* 113* 108* 96    Imaging No results found.   STUDIES:  CXR 11/26 >> LUL PNA EEG 12/5: negative for seizure   CULTURES: Blood 11/26 >> coag neg staph 1/2  C. Diff 11/26 >> (-) resp 11/27 >>  Enterobacter cloacae  ANTIBIOTICS: Vanc 11/26 >>11/30 Pip-tazo 11/26 >>12/1 Cipro 12/1 >   SIGNIFICANT EVENTS: Intubated (endotracheally) at [facility] ED 11/26 >> 11/29 bronch - left inspissated pus  LINES/TUBES: PEG PICC 11/27  RUE >> Foley Trache 12/1   DISCUSSION: 50 y/o man s/p devastating meningitis, chronic  debility, p/w fever, AMS & severe L HCAP vs aspiration. Agitation major barrier to progress at this point. Added back his home meds (Klonopin and seroquel). Now off vent. Trying to optimize sedation regimen.   ASSESSMENT / PLAN:   Acute resp failure/ ARDS/ L HCAP with Enterobacter cloacae Hx of trach x2. Trached for the 3rd time on 12/1 Left hemithorax opacification 11/29 P:   ATC as tolerated. NO Decannulation this time for airway protection and to facilitate MV if / when he decompensates in the future (which will likely happen) cont neb meds.  Consider change to cuffless trach - has been on ATC since 12/6  Other medical issues per [facility]   FAMILY  - Updates: Needs SNF    - Inter-disciplinary family meet or Palliative Care meeting due by: 12/3, has been resistant to these conversations in the  past   Dirk DressKaty Ryzen Deady, NP 11/14/2016  11:04 AM Pager: [phone] or [phone]

## 2016-11-15 ENCOUNTER — Inpatient Hospital Stay (HOSPITAL_COMMUNITY): Payer: Medicaid Other

## 2016-11-15 DIAGNOSIS — J80 Acute respiratory distress syndrome: Secondary | ICD-10-CM

## 2016-11-15 DIAGNOSIS — I5042 Chronic combined systolic (congestive) and diastolic (congestive) heart failure: Secondary | ICD-10-CM

## 2016-11-15 DIAGNOSIS — A419 Sepsis, unspecified organism: Secondary | ICD-10-CM

## 2016-11-15 DIAGNOSIS — G931 Anoxic brain damage, not elsewhere classified: Secondary | ICD-10-CM

## 2016-11-15 DIAGNOSIS — R6520 Severe sepsis without septic shock: Secondary | ICD-10-CM

## 2016-11-15 DIAGNOSIS — J9601 Acute respiratory failure with hypoxia: Secondary | ICD-10-CM

## 2016-11-15 LAB — CBC
HCT: 27.7 % — ABNORMAL LOW (ref 39.0–52.0)
HEMOGLOBIN: 8.7 g/dL — AB (ref 13.0–17.0)
MCH: 29 pg (ref 26.0–34.0)
MCHC: 31.4 g/dL (ref 30.0–36.0)
MCV: 92.3 fL (ref 78.0–100.0)
PLATELETS: 404 10*3/uL — AB (ref 150–400)
RBC: 3 MIL/uL — AB (ref 4.22–5.81)
RDW: 17.9 % — ABNORMAL HIGH (ref 11.5–15.5)
WBC: 12.2 10*3/uL — AB (ref 4.0–10.5)

## 2016-11-15 LAB — GLUCOSE, CAPILLARY
GLUCOSE-CAPILLARY: 106 mg/dL — AB (ref 65–99)
GLUCOSE-CAPILLARY: 99 mg/dL (ref 65–99)
Glucose-Capillary: 99 mg/dL (ref 65–99)
Glucose-Capillary: 103 mg/dL — ABNORMAL HIGH (ref 65–99)
Glucose-Capillary: 84 mg/dL (ref 65–99)

## 2016-11-15 LAB — BASIC METABOLIC PANEL
Anion gap: 11 (ref 5–15)
BUN: 10 mg/dL (ref 6–20)
CHLORIDE: 100 mmol/L — AB (ref 101–111)
CO2: 29 mmol/L (ref 22–32)
Calcium: 8.4 mg/dL — ABNORMAL LOW (ref 8.9–10.3)
Creatinine, Ser: 0.69 mg/dL (ref 0.61–1.24)
Glucose, Bld: 87 mg/dL (ref 65–99)
POTASSIUM: 3.6 mmol/L (ref 3.5–5.1)
SODIUM: 140 mmol/L (ref 135–145)

## 2016-11-15 LAB — MAGNESIUM: MAGNESIUM: 1.6 mg/dL — AB (ref 1.7–2.4)

## 2016-11-15 MED ORDER — ACETYLCYSTEINE 20 % IN SOLN
4.0000 mL | Freq: Three times a day (TID) | RESPIRATORY_TRACT | Status: DC
  Administered 2016-11-16: 4 mL via RESPIRATORY_TRACT
  Filled 2016-11-15: qty 4

## 2016-11-15 MED ORDER — MAGNESIUM SULFATE 50 % IJ SOLN
3.0000 g | Freq: Once | INTRAVENOUS | Status: AC
  Administered 2016-11-15: 3 g via INTRAVENOUS
  Filled 2016-11-15: qty 6

## 2016-11-15 MED ORDER — ACETYLCYSTEINE 20 % IN SOLN
4.0000 mL | Freq: Three times a day (TID) | RESPIRATORY_TRACT | Status: DC
  Administered 2016-11-15 (×2): 4 mL via RESPIRATORY_TRACT
  Filled 2016-11-15 (×2): qty 4

## 2016-11-15 NOTE — Progress Notes (Signed)
Sutures removed from trach site, per MD order, without complications.

## 2016-11-15 NOTE — Progress Notes (Signed)
PROGRESS NOTE    Dale [name]  WER:[id number] DOB: June 30, 1966 DOA: 10/19/2016 PCP: No PCP Per Patient   Brief Narrative:  50 y.o. BM with PMHx Cerebral septic emboli ; Cerebral thrombosis with cerebral infarction (03/25/2016),  Convulsions/seizures  (05/04/2016); Dementia (04/27/2016); Depression; Dysphagia (05/04/2016); ETOH abuse;Tobacco abuse , Hydrocephalus Acute encephalopathy;Meningitis, Acute ischemic stroke, Cardiac arrest; Cardiogenic shock, NSTEMI,  Chronic Diastolic and Systolic CHF    Admission in March 2017 with strep pneumo meningitis c/b cardiac arrest and cardiogenic shock, ARDS requiring prolonged intubation and trach (now decannulated), and acute renal failure. He was admitted to [facility] 06/16/16 through 07/11/16 for recurrent hypoglycemia due to adrenal insufficiency, new subacute CVA, seizures with inability to protect airway s/p intubation 7/13 through 7/21. He re-presented 8/08 again with sepsis from UTI, and was intubated due to AMS, and was trached 8/18, and was discharged with a cuffless trach. That hospitalization was c/b MRSA bacteremia 8/24, and completed 3 weeks of IV vanc. He also developed C. Diff, and was treated with PO vanc through 9/11. He has had continued autonomic dysreflexia, which worsens during complications of acute illnesses.  Prior to this presentation, he had been well, although he was decannulated for reasons that are unclear. He was brought to the ED with decreased level of consciousness and nausea/vomiting/diarrhea. A CXR showed consolidation of the left upper lobe. He was intubated and transferred to [facility] 2.  There have been significant amount of conversations with his wife with regard to goals of care, but she has insisted on full support. He is full code.   Subjective: 12/12 A/O x 0. Eyes open unresponsive. Mild w/d to pain    Assessment & Plan:   Active Problems:   Respiratory failure (HCC)   Renal failure   Pressure injury of skin   Acute hypoxic  respiratory failure secondary to Enterobacter HCAP w/ ARDS and Sepsis  Status post tracheostomy for the third time - trach care per PCCM with no plans to decannulate - sepsis and acute infection appear to have cleared - tolerating trach collar - Decreased to FIO2 28%. 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 -Mental status appears to be at his baseline   Transient Atrial fibrillation -currently NSR   Chronic Systolic and Diastolic CHF -EF 19% Aug 5093  - no evidence of volume overload at this time  -Strict in and out since admission +4.2 L Filed Weights   11/13/16 2354 11/14/16 0440 11/15/16 0347  Weight: 70.8 kg (156 lb) 67.9 kg (149 lb 12.8 oz) 68.1 kg (150 lb 1.6 oz)  - weight is Relatively stable  Adrenal insufficiency  - 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   Hypernatremia -Resolved with increase in free water via tube   Hypokalemia -Potassium goal > 4   Hypomagnesemia -Magnesium goal > 2  -Magnesium IV 3 g  Severe malnutrition in context of chronic illness -Continue tube feeds per PEG      DVT prophylaxis: Subcutaneous heparin Code Status: Full Family Communication: None Disposition Plan: Ready for SNF in next 24-48 hours if he stays on 28% FiO2   Consultants:  [facility]  Procedures/Significant Events:  11/26 [facility]ub[facility]racheally) at  [facility] ED - [facility]  11/29 bronch - left inspissated pus   VENTILATOR SETTINGS: NA   Cultures   Antimicrobials: Vanc 11/26 > 11/29 Zosyn 11/26 > 12/1 Cipro 12/1 >12/8   Devices    LINES / TUBES:      Continuous Infusions: . feeding  supplement (VITAL AF 1.2 CAL) 1,000 mL (11/15/16 0528)     Objective: Vitals:   11/15/16 0815 11/15/16 0818 11/15/16 0819 11/15/16 0828  BP:   (!) 148/102 (!) 148/102  Pulse:   (!) 123   Resp:   (!) 29   Temp:    99.4 F (37.4 C)  TempSrc:    Axillary  SpO2: 99% 99% 99%   Weight:      Height:        Intake/Output Summary (Last 24 hours) at 11/15/16 1020 Last data filed at 11/15/16 0849  Gross per 24 hour  Intake             1985 ml  Output             3175 ml  Net            -1190 ml   Filed Weights   11/13/16 2354 11/14/16 0440 11/15/16 0347  Weight: 70.8 kg (156 lb) 67.9 kg (149 lb 12.8 oz) 68.1 kg (150 lb 1.6 oz)    Examination:  General: No acute respiratory distress  Lungs: Clear to auscultation bilaterally Cardiovascular: Tachycardia w/o gallup or rub   Abdomen: Nondistended, soft, bowel sounds positive Extremities: Stable trace edema B LE  .     Data Reviewed: Care during the described time interval was provided by me .  I have reviewed this patient's available data, including medical history, events of note, physical examination, and all test results as part of my evaluation. I have personally reviewed and interpreted all radiology studies.  CBC:  Recent Labs Lab 11/09/16 0553 11/10/16 0612 11/11/16 0440 11/12/16 0446 11/13/16 0235 11/15/16 0530  WBC 12.6* 11.7* 11.1* 8.1 14.3* 12.2*  NEUTROABS 10.6* 9.2* 9.2*  --   --   --   HGB 8.8* 9.0* 8.4* 13.0 8.7* 8.7*  HCT 27.5* 28.1* 26.6* 41.2 27.3* 27.7*  MCV 93.5 94.0 93.3 92.2 91.9 92.3  PLT 217 247 263 239 344 224*   Basic Metabolic Panel:  Recent Labs Lab 11/09/16 0553 11/10/16 0612 11/11/16 0440 11/11/16 0441 11/12/16 0446 11/13/16 0235 11/14/16 0311 11/15/16 0530  NA 144 144  --  145 146* 142 142 140  K 3.2* 3.4*  --  3.4* 3.5 3.2* 3.4* 3.6  CL 105 106  --  104 104 101 104 100*  CO2 29 29  --  '30 31 29 28 29  '$ GLUCOSE 114* 95  --  111* 99 104* 116* 87  BUN 9 9  --  '8 8 9 10 10    '$ CREATININE 0.63 0.63  --  0.59* 0.63 0.71 0.63 0.69  CALCIUM 7.7* 7.7*  --  8.0* 8.3* 8.1* 8.2* 8.4*  MG 1.5* 1.9 1.8  --   --   --  1.5* 1.6*  PHOS 4.4 5.0*  --  5.5*  --   --   --   --    GFR: Estimated Creatinine Clearance: 104 mL/min (by C-G formula based on SCr of 0.69 mg/dL). Liver Function Tests:  Recent Labs Lab 11/09/16 0553 11/10/16 0612 11/11/16 0441 11/12/16 0446  AST  --   --   --  21  ALT  --   --   --  27  ALKPHOS  --   --   --  51  BILITOT  --   --   --  0.4  PROT  --   --   --  5.4*  ALBUMIN 1.3* 1.4* 1.4* 1.5*   No results for input(s): LIPASE, AMYLASE in the last 168 hours. No results for input(s): AMMONIA in the last 168 hours. Coagulation Profile: No results for input(s): INR, PROTIME in the last 168 hours. Cardiac Enzymes: No results for input(s): CKTOTAL, CKMB, CKMBINDEX, TROPONINI in the last 168 hours. BNP (last 3 results) No results for input(s): PROBNP in the last 8760 hours. HbA1C: No results for input(s): HGBA1C in the last 72 hours. CBG:  Recent Labs Lab 11/14/16 1748 11/14/16 2013 11/14/16 2314 11/15/16 0409 11/15/16 0826  GLUCAP 91 95 101* 106* 99   Lipid Profile: No results for input(s): CHOL, HDL, LDLCALC, TRIG, CHOLHDL, LDLDIRECT in the last 72 hours. Thyroid Function Tests: No results for input(s): TSH, T4TOTAL, FREET4, T3FREE, THYROIDAB in the last 72 hours. Anemia Panel: No results for input(s): VITAMINB12, FOLATE, FERRITIN, TIBC, IRON, RETICCTPCT in the last 72 hours. Urine analysis:    Component Value Date/Time   COLORURINE YELLOW 10/23/2016 1807   APPEARANCEUR CLEAR 11/02/2016 1807   LABSPEC <1.005 (L) 10/12/2016 1807   PHURINE 7.0 10/31/2016 1807   GLUCOSEU NEGATIVE 10/10/2016 1807   HGBUR LARGE (A) 10/15/2016 1807   BILIRUBINUR NEGATIVE 10/31/2016 1807   KETONESUR NEGATIVE 10/31/2016 1807   PROTEINUR 30 (A) 10/18/2016 1807   UROBILINOGEN 1.0 11/13/2011 2010   NITRITE NEGATIVE 10/20/2016 1807   LEUKOCYTESUR  NEGATIVE 10/29/2016 1807   Sepsis Labs: '@LABRCNTIP'$ (procalcitonin:4,lacticidven:4)  )No results found for this or any previous visit (from the past 240 hour(s)).       Radiology Studies: Dg Chest Port 1 View  Result Date: 11/15/2016 CLINICAL DATA:  Pneumonia EXAM: PORTABLE CHEST 1 VIEW COMPARISON:  11/05/2016 FINDINGS: Tracheostomy remains in good position. Complete opacification left hemithorax compatible with further collapse compared with the prior study. Heart is shifted mildly to the right suggesting underlying left pleural effusion. Right lung clear.  Right arm PICC tip in the SVC. IMPRESSION: Further collapse of the left lung with complete opacification of the left chest. Mild shift of the mediastinal structures to the right. Endobronchial obstruction due to mucous plugging or tumor possible. Underlying pneumonia, tumor, or empyema are possible. Electronically Signed   By: Franchot Gallo M.D.   On: 11/15/2016 08:31        Scheduled Meds: . atorvastatin  20 mg Per Tube q1800  . budesonide (PULMICORT) nebulizer solution  0.5 mg Nebulization BID  . chlorhexidine gluconate (MEDLINE KIT)  15 mL Mouth Rinse BID  . clonazePAM  0.25 mg Per Tube Q6H  . famotidine  20 mg Per Tube BID  . fludrocortisone  0.1 mg Per Tube Daily  . folic acid  1 mg Per Tube Daily  . free water  200 mL Per Tube Q4H  . heparin  5,000 Units Subcutaneous Q8H  . hydrocortisone  10 mg Per Tube QHS  . hydrocortisone  5 mg Per Tube Daily  . ipratropium  0.5 mg Nebulization QID  . levalbuterol  0.63 mg Nebulization QID  . levETIRAcetam  1,500 mg Per Tube BID  . magnesium oxide  400 mg Per Tube BID  . mouth rinse  15 mL Mouth Rinse BID  . metoprolol tartrate  12.5 mg Per Tube BID  . QUEtiapine  50 mg Per Tube BID  .  thiamine  100 mg Per Tube Daily  . Valproate Sodium  750 mg Per Tube Q8H   Continuous Infusions: . feeding supplement (VITAL AF 1.2 CAL) 1,000 mL (11/15/16 0528)     LOS: 16 days    Time  spent: 40 minutes    Jozie Wulf, Geraldo Docker, MD [facility] Pager [phone]   If 7PM-7AM, please contact night-coverage [email or web address] Password [email or web address] 11/15/2016, 10:20 AM

## 2016-11-16 ENCOUNTER — Inpatient Hospital Stay (HOSPITAL_COMMUNITY): Payer: Medicaid Other

## 2016-11-16 LAB — GLUCOSE, CAPILLARY
GLUCOSE-CAPILLARY: 106 mg/dL — AB (ref 65–99)
GLUCOSE-CAPILLARY: 91 mg/dL (ref 65–99)
GLUCOSE-CAPILLARY: 97 mg/dL (ref 65–99)
GLUCOSE-CAPILLARY: 87 mg/dL (ref 65–99)
Glucose-Capillary: 117 mg/dL — ABNORMAL HIGH (ref 65–99)
Glucose-Capillary: 123 mg/dL — ABNORMAL HIGH (ref 65–99)

## 2016-11-16 MED ORDER — ACETYLCYSTEINE 20 % IN SOLN
4.0000 mL | Freq: Four times a day (QID) | RESPIRATORY_TRACT | Status: DC
  Administered 2016-11-16 – 2016-11-18 (×7): 4 mL via RESPIRATORY_TRACT
  Filled 2016-11-16 (×7): qty 4

## 2016-11-16 NOTE — Progress Notes (Signed)
Pt bag/lavage done per MD order.  Pt tolerated well. Only minimal secretions.  RT will continue to monitor.

## 2016-11-16 NOTE — Progress Notes (Signed)
PULMONARY / CRITICAL CARE MEDICINE   Name: Dale [name] MRN: [id number] DOB: 12/17/1965    ADMISSION DATE:  10/21/2016  CHIEF COMPLAINT:  Nausea and Vomiting  50 yom w/ severe neurological deficits after meningitis.  He was brought to the ED with decreased level of consciousness and nausea/vomiting/diarrhea. A CXR showed consolidation of the left upper lobe. [facility]ub[facility]   SUBJECTIVE: Resting comfortably on 40% ATC.  Had increased secretions overnight, required suctioning 5 times. CXR this AM with increased collapse of left lung.  VITAL SIGNS: BP (!) 156/111 (BP Location: Left Arm)   Pulse (!) 110   Temp 97.2 F (36.2 C) (Axillary)   Resp (!) 22   Ht 5\' 8"  (1.727 m)   Wt 153 lb (69.4 kg)   SpO2 96%   BMI 23.26 kg/m   HEMODYNAMICS:    VENTILATOR SETTINGS: FiO2 (%):  [28 %-30 %] 28 %  INTAKE / OUTPUT: I/O last 3 completed shifts: In: 4625.9 [I.V.:106; Other:210; NG/GT:4203.9; IV Piggyback:106] Out: 4350 [Urine:4350]  PHYSICAL EXAMINATION: General: Middle aged male , chronically ill appearing, calm, NAD   Neuro: Somnolent but opens eyes to noxious stimuli.  CN grossly intact. (-) lateralizing signs. HEENT: [place]/AT. PERRL, sclerae anicteric. Cardiovascular: RRR, no M/R/G.  Lungs: Respirations even and unlabored on ATC, few scattered rhonchi Abdomen: BS x 4, soft, NT/ND.  Musculoskeletal: No gross deformities, no edema.  Skin: Intact, warm, no rashes.  LABS:  BMET  Recent Labs Lab 11/13/16 0235 11/14/16 0311 11/15/16 0530  NA 142 142 140  K 3.2* 3.4* 3.6  CL 101 104 100*  CO2 29 28 29   BUN 9 10 10   CREATININE 0.71 0.63 0.69  GLUCOSE 104* 116* 87    Electrolytes  Recent Labs Lab 11/10/16 0612 11/11/16 0440 11/11/16 0441  11/13/16 0235 11/14/16 0311 11/15/16 0530  CALCIUM 7.7*  --  8.0*  < > 8.1* 8.2* 8.4*  MG 1.9 1.8  --   --   --  1.5* 1.6*  PHOS 5.0*  --  5.5*  --   --   --   --   < > = values in this interval not  displayed.  CBC  Recent Labs Lab 11/12/16 0446 11/13/16 0235 11/15/16 0530  WBC 8.1 14.3* 12.2*  HGB 13.0 8.7* 8.7*  HCT 41.2 27.3* 27.7*  PLT 239 344 404*    Coag's No results for input(s): APTT, INR in the last 168 hours.  Sepsis Markers No results for input(s): LATICACIDVEN, PROCALCITON, O2SATVEN in the last 168 hours.  ABG No results for input(s): PHART, PCO2ART, PO2ART in the last 168 hours.  Liver Enzymes  Recent Labs Lab 11/10/16 0612 11/11/16 0441 11/12/16 0446  AST  --   --  21  ALT  --   --  27  ALKPHOS  --   --  51  BILITOT  --   --  0.4  ALBUMIN 1.4* 1.4* 1.5*    Cardiac Enzymes No results for input(s): TROPONINI, PROBNP in the last 168 hours.  Glucose  Recent Labs Lab 11/15/16 1204 11/15/16 1528 11/15/16 2009 11/16/16 0033 11/16/16 0505 11/16/16 0837  GLUCAP 99 103* 84 117* 106* 91    Imaging No results found.   STUDIES:  CXR 11/26 >> LUL PNA EEG 12/5: negative for seizure   CULTURES: Blood 11/26 >> coag neg staph 1/2  C. Diff 11/26 >> (-) resp 11/27 >>  Enterobacter cloacae  ANTIBIOTICS: Vanc 11/26 >>11/30 Pip-tazo 11/26 >>12/1  Cipro 12/1 > 12/8   SIGNIFICANT EVENTS: [facility] ([facility]racheally) at [facility] ED 11/26 11/29 bronch - left inspissated pus  LINES/TUBES: PEG PICC 11/27  RUE >> Foley Trache 12/1   DISCUSSION: 50 y/o man s/p devastating meningitis, chronic debility, p/w fever, AMS & severe L HCAP vs aspiration. Agitation major barrier to progress at this point. Added back his home meds (Klonopin and seroquel). Now off vent. Trying to optimize sedation regimen.   ASSESSMENT / PLAN:   Acute resp failure/ ARDS/ L HCAP with Enterobacter cloacae - s/p abx. Now complete opacification of left lung - worsened on CXR 12/12.  Suspect mucous plugging + atx. Hx of trach x2. Trached for the 3rd time on 12/1 P:   Continue ATC as tolerated NO Decannulation this time for airway protection and to facilitate MV if / when he  decompensates in the future (which will likely happen) Cont neb meds.  Would not change to cuffless trach given his increased secretions and now complete opacification left lung. Add mucomyst nebs x 48 hours. Add chest PT. Start bag lavage - have placed order asking RT to start. May need bronch.  Other medical issues per [facility]   FAMILY  - Updates: Needs SNF    - Inter-disciplinary family meet or Palliative Care meeting due by: 12/3, has been resistant to these conversations in the past   Rutherford Guysahul Desai, [place] - [facility] Pager: [phone]  or [phone] 11/16/2016, 10:53 AM

## 2016-11-16 NOTE — Progress Notes (Signed)
Pt was suctioned a total of 5 times between RN and RT throughout 12 hr night shift.

## 2016-11-16 NOTE — Progress Notes (Signed)
[facility] TEAM 1 - Stepdown/ICU TEAM  Dale [name]  OZD:[id number] DOB: 1966-03-15 DOA: 10/07/2016 PCP: No PCP Per Patient    Brief Narrative:  50 yo M w/ severe neurological deficits after meningitis.  He was brought to the ED with decreased level of consciousness and nausea/vomiting/diarrhea. A CXR showed consolidation of the left upper lobe. He was intubated and transferred to [facility].  Significant Events: 11/26 Intubated (endotracheally) at [facility] ED - transferred to [facility]  11/29 bronch - left inspissated pus  Subjective: The patient appears to be resting comfortably in bed.  He will open his eyes but does not follow the examiner and does not follow simple commands.  There is no evidence of respiratory distress or uncontrolled pain in the time of visit.  CXR 12/12 AM noted near complete opacification of L lung.    Assessment & Plan:  Acute hypoxic respiratory failure secondary to extensive L Lung Enterobacter HCAP w/ ARDS and Sepsis  Status post tracheostomy for the third time - trach care per PCCM with no plans to decannulate - sepsis and acute infection appear to have cleared - tolerating trach collar  Enterobacter cloacae left lung pneumonia w/ extensive infiltrate and atx of L lung Has completed a full course of antibiotic therapy - follow-up chest x-ray 12/12 noted complete opacification of L lung - PCCM has initiated attempts at mucolysis - may require bronch - tolerating remarkably well clinically   Coag neg Staph in 1 of 2 blood cx  Most c/w contaminant - no symptoms at present to suggest active bacteremia  Acute on chronic encephalopathy - 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 Mental status appears to be at his baseline   Transient Atrial fibrillation sinus tachycardia nearly resolved - no recurrent A. fib of late  Chronic systolic CHF EF 25% Aug 9563 - no evidence of volume overload at this time - weight is stable Filed Weights   11/14/16 0440 11/15/16 0347 11/16/16 0428   Weight: 67.9 kg (149 lb 12.8 oz) 68.1 kg (150 lb 1.6 oz) 69.4 kg (153 lb)    Adrenal insufficiency - Autonomic dysreflexia Continuing Florinef and Hydrocortisone   Hypernatremia Resolved with increase in free water via tube   Hypokalemia Cont to supplement and follow - goal is 4.0 - recheck in AM   Hypomagnesemia Recheck in AM   Severe malnutrition in context of chronic illness Continue tube feeds per PEG   DVT prophylaxis: SQ heparin  Code Status: FULL CODE Family Communication: no family present at time of exam  Disposition Plan: SDU - eventual trach capable SNF once L lung airation improved and stable   Consultants:  PCCM  Antimicrobials:  Vanc 11/26 > 11/29 Zosyn 11/26 > 12/1 Cipro 12/1 > 12/8  Objective: Blood pressure (!) 147/103, pulse 95, temperature 97.4 F (36.3 C), temperature source Axillary, resp. rate 19, height '5\' 8"'$  (1.727 m), weight 69.4 kg (153 lb), SpO2 99 %.  Intake/Output Summary (Last 24 hours) at 11/16/16 1523 Last data filed at 11/16/16 1508  Gross per 24 hour  Intake          3135.59 ml  Output             3150 ml  Net           -14.41 ml   Filed Weights   11/14/16 0440 11/15/16 0347 11/16/16 0428  Weight: 67.9 kg (149 lb 12.8 oz) 68.1 kg (150 lb 1.6 oz) 69.4 kg (153 lb)    Examination: General:  No acute respiratory distress evident despite CXR findings  Lungs: distant BS th/o all fields - breath sounds noted on L though very faint  Cardiovascular: Regular rate without appreciable murmur  Abdomen: Nondistended, soft, bowel sounds positive Extremities: Stable trace edema B LE w/o cyanosis   CBC:  Recent Labs Lab 11/10/16 0612 11/11/16 0440 11/12/16 0446 11/13/16 0235 11/15/16 0530  WBC 11.7* 11.1* 8.1 14.3* 12.2*  NEUTROABS 9.2* 9.2*  --   --   --   HGB 9.0* 8.4* 13.0 8.7* 8.7*  HCT 28.1* 26.6* 41.2 27.3* 27.7*  MCV 94.0 93.3 92.2 91.9 92.3  PLT 247 263 239 344 353*   Basic Metabolic Panel:  Recent Labs Lab  11/10/16 0612 11/11/16 0440 11/11/16 0441 11/12/16 0446 11/13/16 0235 11/14/16 0311 11/15/16 0530  NA 144  --  145 146* 142 142 140  K 3.4*  --  3.4* 3.5 3.2* 3.4* 3.6  CL 106  --  104 104 101 104 100*  CO2 29  --  '30 31 29 28 29  '$ GLUCOSE 95  --  111* 99 104* 116* 87  BUN 9  --  '8 8 9 10 10  '$ CREATININE 0.63  --  0.59* 0.63 0.71 0.63 0.69  CALCIUM 7.7*  --  8.0* 8.3* 8.1* 8.2* 8.4*  MG 1.9 1.8  --   --   --  1.5* 1.6*  PHOS 5.0*  --  5.5*  --   --   --   --    GFR: Estimated Creatinine Clearance: 104.5 mL/min (by C-G formula based on SCr of 0.69 mg/dL).  Liver Function Tests:  Recent Labs Lab 11/10/16 0612 11/11/16 0441 11/12/16 0446  AST  --   --  21  ALT  --   --  27  ALKPHOS  --   --  51  BILITOT  --   --  0.4  PROT  --   --  5.4*  ALBUMIN 1.4* 1.4* 1.5*    HbA1C: Hgb A1c MFr Bld  Date/Time Value Ref Range Status  05/20/2016 04:20 AM 5.3 4.8 - 5.6 % Final    Comment:    (NOTE)         Pre-diabetes: 5.7 - 6.4         Diabetes: >6.4         Glycemic control for adults with diabetes: <7.0   03/27/2016 05:35 AM 5.8 (H) 4.8 - 5.6 % Final    Comment:    (NOTE)         Pre-diabetes: 5.7 - 6.4         Diabetes: >6.4         Glycemic control for adults with diabetes: <7.0     CBG:  Recent Labs Lab 11/15/16 2009 11/16/16 0033 11/16/16 0505 11/16/16 0837 11/16/16 1246  GLUCAP 84 117* 106* 91 123*    Scheduled Meds: . acetylcysteine  4 mL Nebulization Q6H  . atorvastatin  20 mg Per Tube q1800  . budesonide (PULMICORT) nebulizer solution  0.5 mg Nebulization BID  . chlorhexidine gluconate (MEDLINE KIT)  15 mL Mouth Rinse BID  . clonazePAM  0.25 mg Per Tube Q6H  . famotidine  20 mg Per Tube BID  . fludrocortisone  0.1 mg Per Tube Daily  . folic acid  1 mg Per Tube Daily  . free water  200 mL Per Tube Q4H  . heparin  5,000 Units Subcutaneous Q8H  . hydrocortisone  10 mg Per Tube QHS  .  hydrocortisone  5 mg Per Tube Daily  . ipratropium  0.5 mg  Nebulization QID  . levalbuterol  0.63 mg Nebulization QID  . levETIRAcetam  1,500 mg Per Tube BID  . mouth rinse  15 mL Mouth Rinse BID  . metoprolol tartrate  12.5 mg Per Tube BID  . QUEtiapine  50 mg Per Tube BID  . thiamine  100 mg Per Tube Daily  . Valproate Sodium  750 mg Per Tube Q8H   Continuous Infusions: . feeding supplement (VITAL AF 1.2 CAL) 1,000 mL (11/16/16 0555)     LOS: 17 days   Cherene Altes, MD [facility] Office  [phone] Pager - Text Page per Amion as per below:  On-Call/Text Page:      [email or web address]      password [email or web address]  If 7PM-7AM, please contact night-coverage [email or web address] Password [facility] 11/16/2016, 3:23 PM

## 2016-11-16 NOTE — Progress Notes (Signed)
Nutrition Follow-up  DOCUMENTATION CODES:   Severe malnutrition in context of chronic illness  INTERVENTION:    Continue Vital AF 1.2 via PEG at 65 ml/h to provide 1872 kcals, 117 gm protein, 1265 ml free water daily.  Continue free water flushes 200 ml every 4 hours.  NUTRITION DIAGNOSIS:   Malnutrition (Severe) related to chronic illness as evidenced by severe depletion of body fat, severe depletion of muscle mass  Ongoing  GOAL:   Patient will meet greater than or equal to 90% of their needs  Met  MONITOR:   TF tolerance, I & O's, Vent status, Labs  ASSESSMENT:   Pt with strep pneumo meningitis c/b cardiac arrest and cardiogenic shock, ARDS requiring prolonged intubation and trach (now decannulated), and acute renal failure.  He was admitted to [facility] 06/16/16 through 07/11/16 for recurrent hypoglycemia due to adrenal insufficiency, new subacute CVA, seizures with inability to protect airway s/p intubation 7/13 through 7/21.  He re-presented 8/08 again with sepsis from UTI, and was intubated due to AMS, and was trached 8/18, and was discharged with a cuffless trach. That hospitalization was c/b MRSA bacteremia 8/24, and completed 3 weeks of IV vanc. He also developed C. Diff, and was treated with PO vanc through 9/11. He has had continued autonomic dysreflexia, which worsens during complications of acute illnesses. Admitted with N/V/D.   Pt transferred from 37M-MICU to [facility] 12/8. S/p devastating meningitis, chronic debility and severe HCAP vs aspiration.  Currently on ATC. RT to start bag lavage. Receiving Vital AF 1.2 via PEG at 65 ml/h to provide 1872 kcals, 117 gm protein, 1265 ml free water daily.  Free water flushes 200 ml every 4 hours. CBG's [id number].  Diet Order:  Diet NPO time specified  Skin:  Wound (see comment) (unstageable right heel)  Last BM:  12/12  Height:   Ht Readings from Last 1 Encounters:  10/31/16 '5\' 8"'$  (1.727 m)     Weight: >>> stable  Wt Readings from Last 1 Encounters:  11/16/16 153 lb (69.4 kg)    12/12  150 lb 12/11  149 lb 12/10  156 lb 12/09  164 lb 12/08  167 lb 12/07  168 lb 12/06  167 lb 12/05  166 lb 12/04  164 lb 12/03  163 lb 12/02  164 lb  Ideal Body Weight:  70 kg  BMI:  Body mass index is 23.26 kg/m.  Estimated Nutritional Needs:   Kcal:  1900-2100  Protein:  100-120 gm  Fluid:  1.9-2.1 L  EDUCATION NEEDS:   No education needs identified at this time  Arthur Holms, RD, LDN Pager #: [phone] After-Hours Pager #: [phone]

## 2016-11-17 ENCOUNTER — Inpatient Hospital Stay (HOSPITAL_COMMUNITY): Payer: Medicaid Other

## 2016-11-17 DIAGNOSIS — T17500A Unspecified foreign body in bronchus causing asphyxiation, initial encounter: Secondary | ICD-10-CM

## 2016-11-17 DIAGNOSIS — J9809 Other diseases of bronchus, not elsewhere classified: Secondary | ICD-10-CM

## 2016-11-17 DIAGNOSIS — J80 Acute respiratory distress syndrome: Secondary | ICD-10-CM

## 2016-11-17 DIAGNOSIS — J9600 Acute respiratory failure, unspecified whether with hypoxia or hypercapnia: Secondary | ICD-10-CM

## 2016-11-17 LAB — BASIC METABOLIC PANEL
ANION GAP: 11 (ref 5–15)
BUN: 11 mg/dL (ref 6–20)
CALCIUM: 8.4 mg/dL — AB (ref 8.9–10.3)
CO2: 30 mmol/L (ref 22–32)
CREATININE: 0.63 mg/dL (ref 0.61–1.24)
Chloride: 98 mmol/L — ABNORMAL LOW (ref 101–111)
GFR calc Af Amer: >60 mL/min (ref >60)
GLUCOSE: 94 mg/dL (ref 65–99)
Potassium: 3.3 mmol/L — ABNORMAL LOW (ref 3.5–5.1)
Sodium: 139 mmol/L (ref 135–145)

## 2016-11-17 LAB — CBC
HCT: 28.5 % — ABNORMAL LOW (ref 39.0–52.0)
HEMOGLOBIN: 9 g/dL — AB (ref 13.0–17.0)
MCH: 29.6 pg (ref 26.0–34.0)
MCHC: 31.6 g/dL (ref 30.0–36.0)
MCV: 93.8 fL (ref 78.0–100.0)
PLATELETS: 383 10*3/uL (ref 150–400)
RBC: 3.04 MIL/uL — ABNORMAL LOW (ref 4.22–5.81)
RDW: 17.7 % — AB (ref 11.5–15.5)
WBC: 12 10*3/uL — ABNORMAL HIGH (ref 4.0–10.5)

## 2016-11-17 LAB — GLUCOSE, CAPILLARY
GLUCOSE-CAPILLARY: 107 mg/dL — AB (ref 65–99)
GLUCOSE-CAPILLARY: 119 mg/dL — AB (ref 65–99)
GLUCOSE-CAPILLARY: 121 mg/dL — AB (ref 65–99)
Glucose-Capillary: 106 mg/dL — ABNORMAL HIGH (ref 65–99)
Glucose-Capillary: 110 mg/dL — ABNORMAL HIGH (ref 65–99)
Glucose-Capillary: 117 mg/dL — ABNORMAL HIGH (ref 65–99)
Glucose-Capillary: 138 mg/dL — ABNORMAL HIGH (ref 65–99)

## 2016-11-17 LAB — MAGNESIUM: MAGNESIUM: 2.1 mg/dL (ref 1.7–2.4)

## 2016-11-17 MED ORDER — POTASSIUM CHLORIDE 20 MEQ/15ML (10%) PO SOLN
40.0000 meq | Freq: Two times a day (BID) | ORAL | Status: AC
  Administered 2016-11-17 (×2): 40 meq
  Filled 2016-11-17 (×2): qty 30

## 2016-11-17 MED ORDER — ALTEPLASE 2 MG IJ SOLR: 2.0000 mg | Freq: Once | INTRAMUSCULAR | Status: DC

## 2016-11-17 MED ORDER — ALTEPLASE 2 MG IJ SOLR
2.0000 mg | Freq: Once | INTRAMUSCULAR | Status: AC
  Administered 2016-11-17: 2 mg

## 2016-11-17 NOTE — Procedures (Addendum)
Bronchoscopy Procedure Note Bonnita HollowCraig D Warnke [id number] 05/19/1966  Procedure: Bronchoscopy Indications: Remove secretions  Procedure Details Consent: Unable to obtain consent because of emergent medical necessity. Time Out: Verified patient identification, verified procedure, site/side was marked, verified correct patient position, special equipment/implants available, medications/allergies/relevent history reviewed, required imaging and test results available.  Performed  In preparation for procedure, patient was given 100% FiO2. Sedation: None  Airway entered and the following bronchi were examined: RUL, RML, RLL, LUL, LLL and Bronchi.   Procedures performed: Brushings performed Bronchoscope removed.    Evaluation Hemodynamic Status: BP stable throughout; O2 sats: stable throughout Patient's Current Condition: stable Specimens:  Purulent Complications: No apparent complications Patient did tolerate procedure well.   YACOUB,WESAM 11/17/2016

## 2016-11-17 NOTE — Progress Notes (Signed)
Pt suctioned every hour post bronchoscopy for mod amt of white thick secretions Maintaining sats in the low 90's.

## 2016-11-17 NOTE — Progress Notes (Addendum)
[place] TEAM 1 - Stepdown/ICU TEAM  Dale [name]  MWN:[id number] DOB: 23-Apr-1966 DOA: 10/17/2016 PCP: No PCP Per Patient    Brief Narrative:  50 yo M w/ severe neurological deficits after meningitis.  He was brought to the ED with decreased level of consciousness and nausea/vomiting/diarrhea. A CXR showed consolidation of the left upper lobe. He was intubated and transferred to [facility].  Significant Events: 11/26 Intubated (endotracheally) at [facility] ED - transferred to [facility]  11/29 bronch - left inspissated pus  Subjective: The patient appears to be resting comfortably in bed.    Desated earlier and required bagging, responded Does not communicate   Assessment & Plan:  Acute on chronic hypoxic respiratory failure secondary to extensive L Lung Enterobacter HCAP w/ ARDS and Sepsis  Status post tracheostomy for the third time - trach care per PCCM with no plans to decannulate - sepsis and acute infection appear to have cleared - tolerating trach collar.Needing repeated suctioning and increased secretions.New left lung white out probably inspissate secretions. Continue aggressive pulmonary toilet, may need bronchoscopy per PCCM. 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 fib of late  Chronic systolic  CHF EF 40% Aug 3474 - no evidence of volume overload at this time - weight is stable Filed Weights   11/15/16 0347 11/16/16 0428 11/17/16 0439  Weight: 68.1 kg (150 lb 1.6 oz) 69.4 kg (153 lb) 69.9 kg (154 lb)    Adrenal insufficiency - Autonomic dysreflexia Continuing Florinef and Hydrocortisone   Hypernatremia Resolved with increase in free water via tube   Hypokalemia Cont to supplement and follow - goal is 4.0 - recheck in AM   Hypomagnesemia Recheck in AM   Severe malnutrition in context of chronic illness Continue tube feeds per PEG   DVT prophylaxis: SQ heparin  Code Status: FULL CODE Family Communication: no family present at time of exam  Disposition Plan: SDU 12/7 - continue stepdown, eventual trach capable SNF once L lung airation improved and stable   Consultants:  PCCM  Antimicrobials:  Vanc 11/26 > 11/29 Zosyn 11/26 > 12/1 Cipro 12/1 > 12/8  Objective: Blood pressure (!) 159/99, pulse (!) 103, temperature 97.5 F (36.4 C), temperature source Axillary, resp. rate (!) 25, height '5\' 8"'$  (1.727 m), weight 69.9 kg (154 lb), SpO2 98 %.  Intake/Output Summary (Last 24 hours) at 11/17/16 0828 Last data filed at 11/17/16 0828  Gross per 24 hour  Intake             2105 ml  Output             2225 ml  Net             -120 ml   Filed Weights   11/15/16 0347 11/16/16 0428 11/17/16 0439  Weight: 68.1 kg (150  lb 1.6 oz) 69.4 kg (153 lb) 69.9 kg (154 lb)    Examination: General: No acute respiratory distress evident despite CXR findings  Lungs: distant BS th/o all fields - breath sounds noted on L though very faint  Cardiovascular: Regular rate without appreciable murmur  Abdomen: Nondistended, soft, bowel sounds positive Extremities: Stable trace edema B LE w/o cyanosis   CBC:  Recent Labs Lab 11/11/16 0440 11/12/16 0446 11/13/16 0235 11/15/16 0530 11/17/16 0441  WBC 11.1* 8.1 14.3* 12.2* 12.0*  NEUTROABS 9.2*  --   --   --   --   HGB 8.4* 13.0 8.7*  8.7* 9.0*  HCT 26.6* 41.2 27.3* 27.7* 28.5*  MCV 93.3 92.2 91.9 92.3 93.8  PLT 263 239 344 404* 947   Basic Metabolic Panel:  Recent Labs Lab 11/11/16 0440  11/11/16 0441 11/12/16 0446 11/13/16 0235 11/14/16 0311 11/15/16 0530 11/17/16 0441  NA  --   < > 145 146* 142 142 140 139  K  --   < > 3.4* 3.5 3.2* 3.4* 3.6 3.3*  CL  --   < > 104 104 101 104 100* 98*  CO2  --   < > '30 31 29 28 29 30  '$ GLUCOSE  --   < > 111* 99 104* 116* 87 94  BUN  --   < > '8 8 9 10 10 11  '$ CREATININE  --   < > 0.59* 0.63 0.71 0.63 0.69 0.63  CALCIUM  --   < > 8.0* 8.3* 8.1* 8.2* 8.4* 8.4*  MG 1.8  --   --   --   --  1.5* 1.6* 2.1  PHOS  --   --  5.5*  --   --   --   --   --   < > = values in this interval not displayed. GFR: Estimated Creatinine Clearance: 104.5 mL/min (by C-G formula based on SCr of 0.63 mg/dL).  Liver Function Tests:  Recent Labs Lab 11/11/16 0441 11/12/16 0446  AST  --  21  ALT  --  27  ALKPHOS  --  51  BILITOT  --  0.4  PROT  --  5.4*  ALBUMIN 1.4* 1.5*    HbA1C: Hgb A1c MFr Bld  Date/Time Value Ref Range Status  05/20/2016 04:20 AM 5.3 4.8 - 5.6 % Final    Comment:    (NOTE)         Pre-diabetes: 5.7 - 6.4         Diabetes: >6.4         Glycemic control for adults with diabetes: <7.0   03/27/2016 05:35 AM 5.8 (H) 4.8 - 5.6 % Final    Comment:    (NOTE)         Pre-diabetes: 5.7 - 6.4         Diabetes: >6.4         Glycemic control for adults with diabetes: <7.0     CBG:  Recent Labs Lab 11/16/16 1246 11/16/16 1651 11/16/16 2010 11/17/16 0052 11/17/16 0324  GLUCAP 123* 97 87 138* 117*    Scheduled Meds: . acetylcysteine  4 mL Nebulization Q6H  . atorvastatin  20 mg Per Tube q1800  . budesonide (PULMICORT) nebulizer solution  0.5 mg Nebulization BID  . chlorhexidine gluconate (MEDLINE KIT)  15 mL Mouth Rinse BID  . clonazePAM  0.25 mg Per Tube Q6H  . famotidine  20 mg Per Tube BID  . fludrocortisone  0.1 mg Per  Tube Daily  . folic acid  1 mg  Per Tube Daily  . free water  200 mL Per Tube Q4H  . heparin  5,000 Units Subcutaneous Q8H  . hydrocortisone  10 mg Per Tube QHS  . hydrocortisone  5 mg Per Tube Daily  . ipratropium  0.5 mg Nebulization QID  . levalbuterol  0.63 mg Nebulization QID  . levETIRAcetam  1,500 mg Per Tube BID  . mouth rinse  15 mL Mouth Rinse BID  . metoprolol tartrate  12.5 mg Per Tube BID  . QUEtiapine  50 mg Per Tube BID  . thiamine  100 mg Per Tube Daily  . Valproate Sodium  750 mg Per Tube Q8H   Continuous Infusions: . feeding supplement (VITAL AF 1.2 CAL) 1,000 mL (11/17/16 0129)     LOS: 18 days     [facility] Office  [phone] Pager - Text Page per Shea Evans as per below:  On-Call/Text Page:      [email or web address]      password [email or web address]  If 7PM-7AM, please contact night-coverage [email or web address] Password [email or web address] 11/17/2016, 8:28 AM

## 2016-11-17 NOTE — Progress Notes (Signed)
PULMONARY / CRITICAL CARE MEDICINE   Name: Dale [name] MRN: [id number] DOB: 10/27/1966    ADMISSION DATE:  10/22/2016  CHIEF COMPLAINT:  Nausea and Vomiting  50 yom w/ severe neurological deficits after meningitis.  He was brought to the ED with decreased level of consciousness and nausea/vomiting/diarrhea. A CXR showed consolidation of the left upper lobe. [facility]   SUBJECTIVE: Desated earlier and required bagging, responded  VITAL SIGNS: BP (!) 154/110 (BP Location: Left Leg)   Pulse (!) 103   Temp (!) 96.3 F (35.7 C) (Axillary)   Resp (!) 33   Ht 5\' 8"  (1.727 m)   Wt 69.9 kg (154 lb)   SpO2 94%   BMI 23.42 kg/m   HEMODYNAMICS:    VENTILATOR SETTINGS: FiO2 (%):  [28 %-40 %] 40 %  INTAKE / OUTPUT: I/O last 3 completed shifts: In: 3750.9 [I.V.:106; NG/GT:3538.9; IV Piggyback:106] Out: 4500 [Urine:4500]  PHYSICAL EXAMINATION: General: Middle aged male , chronically ill appearing, calm, NAD Neuro: Somnolent but opens eyes to noxious stimuli.  CN grossly intact. (-) lateralizing signs. HEENT: [place]/AT. PERRL, sclerae anicteric. Cardiovascular: RRR, no M/R/G.  Lungs: Respirations even and unlabored on ATC, few scattered rhonchi Abdomen: BS x 4, soft, NT/ND.  Musculoskeletal: No gross deformities, no edema.  Skin: Intact, warm, no rashes.  LABS:  BMET  Recent Labs Lab 11/14/16 0311 11/15/16 0530 11/17/16 0441  NA 142 140 139  K 3.4* 3.6 3.3*  CL 104 100* 98*  CO2 28 29 30   BUN 10 10 11   CREATININE 0.63 0.69 0.63  GLUCOSE 116* 87 94   Electrolytes  Recent Labs Lab 11/11/16 0441  11/14/16 0311 11/15/16 0530 11/17/16 0441  CALCIUM 8.0*  < > 8.2* 8.4* 8.4*  MG  --   --  1.5* 1.6* 2.1  PHOS 5.5*  --   --   --   --   < > = values in this interval not displayed.  CBC  Recent Labs Lab 11/13/16 0235 11/15/16 0530 11/17/16 0441  WBC 14.3* 12.2* 12.0*  HGB 8.7* 8.7* 9.0*  HCT 27.3* 27.7* 28.5*  PLT 344 404* 383     Coag's No results for input(s): APTT, INR in the last 168 hours.  Sepsis Markers No results for input(s): LATICACIDVEN, PROCALCITON, O2SATVEN in the last 168 hours.  ABG No results for input(s): PHART, PCO2ART, PO2ART in the last 168 hours.  Liver Enzymes  Recent Labs Lab 11/11/16 0441 11/12/16 0446  AST  --  21  ALT  --  27  ALKPHOS  --  51  BILITOT  --  0.4  ALBUMIN 1.4* 1.5*    Cardiac Enzymes No results for input(s): TROPONINI, PROBNP in the last 168 hours.  Glucose  Recent Labs Lab 11/16/16 1651 11/16/16 2010 11/17/16 0052 11/17/16 0324 11/17/16 0826 11/17/16 1205  GLUCAP 97 87 138* 117* 107* 110*    Imaging Dg Chest Port 1 View  Result Date: 11/17/2016 CLINICAL DATA:  Acute hypoxemic respiratory failure portable chest x-ray of 11/16/2016 EXAM: PORTABLE CHEST 1 VIEW COMPARISON:  Chest x-ray of 11/16/2016, 08/10/2016, and 02/13/2016 FINDINGS: There is little change in complete opacification of the left hemithorax. No significant mediastinal shift is noted therefore some of the opacification may be due to atelectasis as well as fluid or mass effect. The right lung is grossly clear. Tracheostomy and right central venous line remain. IMPRESSION: 1. No change in complete opacification of the left hemithorax with no significant mediastinal  shift. 2. Right lung is clear. 3. Tracheostomy and right central venous line are unchanged in position. Electronically Signed   By: Dwyane DeePaul  Barry M.D.   On: 11/17/2016 08:06   Dg Chest Port 1 View  Result Date: 11/16/2016 CLINICAL DATA:  Acute hypoxia today EXAM: PORTABLE CHEST 1 VIEW COMPARISON:  Portable chest x-ray of November 15, 2016 FINDINGS: There is persistent complete opacification of the left hemithorax. There is mild shift of mediastinum toward the right. The lung markings throughout the right lung are more conspicuous today. The pulmonary vascularity is not clearly engorged. The tracheostomy appliance tip projects at  the level of the clavicular heads. The PICC line tip projects over the proximal SVC. IMPRESSION: Complete opacification of the left hemithorax, stable. Mild interstitial prominence throughout the right lung which is new and may reflect interstitial edema or less likely pneumonia. Electronically Signed   By: David  Swazil[place] M.D.   On: 11/16/2016 15:08     STUDIES:  CXR 11/26 >> LUL PNA EEG 12/5: negative for seizure   CULTURES: Blood 11/26 >> coag neg staph 1/2  C. Diff 11/26 >> (-) resp 11/27 >>  Enterobacter cloacae  ANTIBIOTICS: Vanc 11/26 >>11/30 Pip-tazo 11/26 >>12/1 Cipro 12/1 > 12/8   SIGNIFICANT EVENTS: Intubated (endotracheally) at [facility] ED 11/26 11/29 bronch - left inspissated pus  LINES/TUBES: PEG PICC 11/27  RUE >> Foley Trache 12/1   DISCUSSION: 50 y/o man s/p devastating meningitis, chronic debility, p/w fever, AMS & severe L HCAP vs aspiration. Agitation major barrier to progress at this point. Added back his home meds (Klonopin and seroquel). Now off vent. Trying to optimize sedation regimen.   ASSESSMENT / PLAN:   Acute resp failure/ ARDS/ L HCAP with Enterobacter cloacae - s/p abx. Now complete opacification of left lung - worsened on CXR 12/12.  Suspect mucous plugging + atx. Hx of trach x2. Trached for the 3rd time on 12/1 P:   Continue ATC as tolerated NO Decannulation ever Cont neb meds.  Bronchoscopy today Would not change to cuffless trach given his increased secretions and now complete opacification left lung. Continue mucomyst nebs x 48 hours. Continue chest PT. Start bag lavage - have placed order asking RT to start.  Other medical issues per [facility]   FAMILY  - Updates: Needs trach SNF   - Inter-disciplinary family meet or Palliative Care meeting due by: 12/3, has been resistant to these conversations in the past  Discussed with Dr. Marchelle Gearingamaswamy and PCCM-NP.  Alyson ReedyWesam G. Chrishonda Hesch, M.D. [facility]monary/[facility] Medicine. Pager:  [phone]. After hours pager: [phone].  11/17/2016, 1:30 PM

## 2016-11-17 NOTE — Progress Notes (Signed)
CSW [facility] to follow [facility]  Burna SisJenna H. Fisher Harg[place]on, [place] Clinical Social Worker [phone]

## 2016-11-18 ENCOUNTER — Inpatient Hospital Stay (HOSPITAL_COMMUNITY): Payer: Medicaid Other

## 2016-11-18 DIAGNOSIS — R140 Abdominal distension (gaseous): Secondary | ICD-10-CM

## 2016-11-18 DIAGNOSIS — J9811 Atelectasis: Secondary | ICD-10-CM

## 2016-11-18 LAB — COMPREHENSIVE METABOLIC PANEL
ALBUMIN: 1.6 g/dL — AB (ref 3.5–5.0)
ALK PHOS: 55 U/L (ref 38–126)
ALT: 19 U/L (ref 17–63)
AST: 17 U/L (ref 15–41)
Anion gap: 10 (ref 5–15)
BUN: 12 mg/dL (ref 6–20)
CALCIUM: 8.8 mg/dL — AB (ref 8.9–10.3)
CO2: 29 mmol/L (ref 22–32)
CREATININE: 0.61 mg/dL (ref 0.61–1.24)
Chloride: 101 mmol/L (ref 101–111)
GFR calc Af Amer: >60 mL/min (ref >60)
GLUCOSE: 97 mg/dL (ref 65–99)
POTASSIUM: 4.4 mmol/L (ref 3.5–5.1)
Sodium: 140 mmol/L (ref 135–145)
Total Protein: 6 g/dL — ABNORMAL LOW (ref 6.5–8.1)

## 2016-11-18 LAB — GLUCOSE, CAPILLARY
GLUCOSE-CAPILLARY: 113 mg/dL — AB (ref 65–99)
GLUCOSE-CAPILLARY: 105 mg/dL — AB (ref 65–99)
Glucose-Capillary: 111 mg/dL — ABNORMAL HIGH (ref 65–99)
Glucose-Capillary: 106 mg/dL — ABNORMAL HIGH (ref 65–99)
Glucose-Capillary: 105 mg/dL — ABNORMAL HIGH (ref 65–99)

## 2016-11-18 MED ORDER — SODIUM CHLORIDE 3 % IN NEBU
4.0000 mL | INHALATION_SOLUTION | Freq: Two times a day (BID) | RESPIRATORY_TRACT | Status: AC
  Administered 2016-11-18 – 2016-11-20 (×5): 4 mL via RESPIRATORY_TRACT
  Filled 2016-11-18 (×6): qty 4

## 2016-11-18 MED ORDER — DEXTROSE 5 % IV SOLN
1.0000 g | INTRAVENOUS | Status: DC
  Administered 2016-11-18 – 2016-11-24 (×7): 1 g via INTRAVENOUS
  Filled 2016-11-18 (×8): qty 10

## 2016-11-18 NOTE — Progress Notes (Signed)
Pharmacy Antibiotic Note  Dale [name] is a 50 y.o. male admitted on 2016/07/30 with recurrent PNA.  Pharmacy has been consulted for ceftriaxone dosing.  Starting ceftriaxone for Group B strep and Proteus PNA. Completed treatment for HCAP with enterobacter cloaca PNA on 12/8. Hypothermic and WBC 12.  Plan: Start ceftriaxone 1g IV Q24 Monitor clinical picture F/U abx deescalation / LOT  Height: 5\' 8"  (172.7 cm) Weight: 153 lb (69.4 kg) IBW/kg (Calculated) : 68.4  Temp (24hrs), Avg:97 F (36.1 C), Min:96 F (35.6 C), Max:97.5 F (36.4 C)   Recent Labs Lab 11/12/16 0446 11/13/16 0235 11/14/16 0311 11/15/16 0530 11/17/16 0441 11/18/16 0500  WBC 8.1 14.3*  --  12.2* 12.0*  --   CREATININE 0.63 0.71 0.63 0.69 0.63 0.61    Estimated Creatinine Clearance: 104.5 mL/min (by C-G formula based on SCr of 0.61 mg/dL).    No Known Allergies  Antimicrobials this admission: Vancomycin 11/26 >> 11/30 Zosyn 11/26 >> 11/29 CTX 11/29>>12/1 Cipro 12/1 >> 12/8 Ceftriaxone 12/15 >>  Dose adjustments this admission: n/a  Microbiology results: 11/26 BCID- nothing detected  11/26 BCx: 1/2 Coag negative staph 11/27 UCx: ng 11/27 TA: e.coli  11/27 MRSA PCR: positive 11/29 BAL - enterobacter - S to cipro, R-ctx 12/1 cdiff: neg 12/15 BAL cx: Proteus and Group B strep  Thank you for allowing pharmacy to be a part of this patient's care.  Armandina StammerBATCHELDER,Elsie Baynes J 11/18/2016 2:34 PM

## 2016-11-18 NOTE — Progress Notes (Signed)
Palliative Medicine RN Note: PMT did not see d/t family wanting to wait until "after the holidays." We have met with family several times in past admissions, and they have our information if they would like [place] to be involved.   Please re-consult if Dale [name] family becomes amenable to a PMT consult.  Marjie Skiff Awesome Jared, RN, BSN, [facility] 11/18/2016 9:07 AM Cell [phone] 8:00-4:00 Monday-Friday Office [phone]

## 2016-11-18 NOTE — Progress Notes (Signed)
PT had to be bagged due to desating  After bagging and lavage PT sats returned

## 2016-11-18 NOTE — Progress Notes (Signed)
PULMONARY / CRITICAL CARE MEDICINE   Name: Dale [name] MRN: [id number] DOB: 07/24/1966    ADMISSION DATE:  May 30, 2016  CHIEF COMPLAINT:  Nausea and Vomiting  50 yom w/ severe neurological deficits after meningitis.  He was brought to the ED with decreased level of consciousness and nausea/vomiting/diarrhea. A CXR showed consolidation of the left upper lobe. [facility]   SUBJECTIVE:  No improvement. Still decreased on the left.   VITAL SIGNS: BP (!) 152/107   Pulse 99   Temp (!) 96 F (35.6 C) (Axillary)   Resp 19   Ht 5\' 8"  (1.727 m)   Wt 153 lb (69.4 kg)   SpO2 99%   BMI 23.26 kg/m    VENTILATOR SETTINGS: FiO2 (%):  [30 %-40 %] 30 %  INTAKE / OUTPUT: I/O last 3 completed shifts: In: 2710 [I.V.:30; NG/GT:2680] Out: 2775 [Urine:2775]  PHYSICAL EXAMINATION: General: Middle aged male, chronically ill appearing, not interactive  Neuro: eyes open.  CN grossly intact. (-) lateralizing signs. HEENT: [place]/AT. PERRL, sclerae anicteric. Cardiovascular: RRR, no M/R/G.  Lungs: scattered rhonchi, decreased on left  Abdomen: BS x 4, soft, NT/ND.  Musculoskeletal: No gross deformities, no edema.  Skin: Intact, warm, no rashes.  LABS:  BMET  Recent Labs Lab 11/15/16 0530 11/17/16 0441 11/18/16 0500  NA 140 139 140  K 3.6 3.3* 4.4  CL 100* 98* 101  CO2 29 30 29   BUN 10 11 12   CREATININE 0.69 0.63 0.61  GLUCOSE 87 94 97   Electrolytes  Recent Labs Lab 11/14/16 0311 11/15/16 0530 11/17/16 0441 11/18/16 0500  CALCIUM 8.2* 8.4* 8.4* 8.8*  MG 1.5* 1.6* 2.1  --     CBC  Recent Labs Lab 11/13/16 0235 11/15/16 0530 11/17/16 0441  WBC 14.3* 12.2* 12.0*  HGB 8.7* 8.7* 9.0*  HCT 27.3* 27.7* 28.5*  PLT 344 404* 383    Coag's No results for input(s): APTT, INR in the last 168 hours.  Sepsis Markers No results for input(s): LATICACIDVEN, PROCALCITON, O2SATVEN in the last 168 hours.  ABG No results for input(s): PHART,  PCO2ART, PO2ART in the last 168 hours.  Liver Enzymes  Recent Labs Lab 11/12/16 0446 11/18/16 0500  AST 21 17  ALT 27 19  ALKPHOS 51 55  BILITOT 0.4 <0.1*  ALBUMIN 1.5* 1.6*    Cardiac Enzymes No results for input(s): TROPONINI, PROBNP in the last 168 hours.  Glucose  Recent Labs Lab 11/17/16 1712 11/17/16 2003 11/17/16 2358 11/18/16 0423 11/18/16 0807 11/18/16 1217  GLUCAP 119* 106* 121* 111* 106* 113*    Imaging No results found.   STUDIES:  CXR 11/26 >> LUL PNA EEG 12/5: negative for seizure   CULTURES: Blood 11/26 >> coag neg staph 1/2  C. Diff 11/26 >> (-) resp 11/27 >>  Enterobacter cloacae BAL 12/14: GPR, GPC clusters>>>  ANTIBIOTICS: Vanc 11/26 >>11/30 Pip-tazo 11/26 >>12/1 Cipro 12/1 > 12/8   SIGNIFICANT EVENTS: Intubated (endotracheally) at [facility] ED 11/26 11/29 bronch - left inspissated pus  LINES/TUBES: PEG PICC 11/27  RUE >> Foley Trache 12/1   DISCUSSION: 50 y/o man s/p devastating meningitis, chronic debility, p/w fever, AMS & severe L HCAP vs aspiration.TThis has been treated.  He is trach/PEG dependent. Mucous plugging has been most recent issue. I see this as being a chronic and recurrent issue. We did obtain a BAL on 12/14 during his FOB. Prelim worrisome for staph. If spikes fever or WBC climbs would have low threshold  to rx as HCAP.    ASSESSMENT / PLAN:   Acute resp failure/ ARDS/ L HCAP with Enterobacter cloacae - s/p abx. Left lung atelectasis/collapse d/t mucous plugging  Hx of trach x2. Trached for the 3rd time on 12/1 P:   Continue ATC as tolerated NO Decannulation ever Cont neb meds.  Add hypertonic saline Dc mucomyst  F/u BAL from 12/14 (currently growing GPR, GPC in clusters) Continue chest PT. Trend fever and WBC curve Eventually will need to change back to cuffless trach as can't go to facility w/ cuffed trach  Other medical issues per [facility]   FAMILY  - Updates: Needs trach SNF  Simonne MartinetPeter E Franki Alcaide  ACNP-BC [facility] Pulmonary/Critical Care Pager # [phone] OR # [phone] if no answer  11/18/2016, 12:47 PM

## 2016-11-18 NOTE — Progress Notes (Signed)
[place] TEAM 1 - Stepdown/ICU TEAM  Dale [name]  ZOX:[id number] DOB: 1966/08/30 DOA: 10/28/2016 PCP: No PCP Per Patient    Brief Narrative:  50 yo M w/ severe neurological deficits after meningitis.  He was brought to the ED with decreased level of consciousness and nausea/vomiting/diarrhea. A CXR showed consolidation of the left upper lobe. He was intubated and transferred to [facility].[place] to [facility] 12/08  Significant Events: 11/26 Intubated (endotracheally) at [facility] ED - transferred to [facility]  11/29 bronch - left inspissated pus  Subjective:  Desaturated 12/14 required bagging and emergent bronchoscopy to remove secretions by the bedside Now on ATC at 30%   Assessment & Plan: Acute on chronic hypoxic respiratory failure secondary to extensive L Lung Enterobacter HCAP w/ ARDS and Sepsis  Status post tracheostomy for the third time - trach care per PCCM with no plans to decannulate - sepsis and acute infection appear to have cleared - tolerating trach collar.Needing repeated suctioning and increased secretions.New left lung white out probably inspissate secretions, status post bronchoscopy 12/14. Continue aggressive pulmonary toilet,  chest physical therapy.   Enterobacter cloacae left lung pneumonia   Has completed a full course of antibiotic therapy - follow-up chest x-ray 12/12 noted complete opacification of L lung - PCCM has initiated attempts at mucolysis - may require bronch -per  PCCM Would not change to cuffless trach given his increased secretions and now complete opacification left lung.bag lavage  Chest x-ray 12/14 showed no change in complete opacification of left hemithorax, right lung was clear,  Coag neg Staph in 1 of 2 blood cx  Most c/w contaminant - no symptoms at present to suggest active bacteremia  Acute on chronic encephalopathy - 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 fib of late  Chronic systolic CHF EF 81% Aug 1914 - no evidence of volume overload at this time - weight is stable Filed Weights   11/16/16 0428 11/17/16 0439 11/18/16 0400  Weight: 69.4 kg (153 lb) 69.9 kg (154 lb) 69.4 kg (153 lb)    Adrenal insufficiency - Autonomic dysreflexia Continuing Florinef and Hydrocortisone   Hypernatremia Resolved with increase in free water via tube   Hypokalemia Cont to supplement and follow - goal is 4.0 - recheck in AM   Hypomagnesemia Recheck in AM   Severe malnutrition in context of chronic illness Continue tube feeds per PEG    DVT prophylaxis: SQ heparin  Code Status: FULL CODE Family Communication: no family present at time of exam  Disposition Plan:  continue stepdown, eventual trach capable SNF    Consultants:  PCCM  Antimicrobials:  Vanc 11/26 > 11/29 Zosyn 11/26 > 12/1 Cipro 12/1 > 12/8  Objective: Blood pressure (!) 161/109, pulse (!) 113, temperature 97.5 F (36.4 C), temperature source Oral, resp. rate (!) 30, height '5\' 8"'$  (1.727 m), weight 69.4 kg (153 lb), SpO2 95 %.  Intake/Output Summary (Last 24 hours) at 11/18/16 0908 Last data filed at 11/18/16 0431  Gross per 24 hour  Intake             1720 ml  Output             1425 ml  Net              295 ml   Filed Weights   11/16/16 0428 11/17/16 0439 11/18/16 0400  Weight: 69.4 kg (153 lb) 69.9 kg (154 lb) 69.4 kg (153 lb)    Examination: General: Middle aged male , chronically ill appearing, calm, NAD Neuro: Somnolent but opens eyes to noxious stimuli.  CN grossly intact. (-) lateralizing signs. HEENT: [place]/AT. PERRL, sclerae anicteric. Cardiovascular: RRR, no M/R/G.  Lungs: Respirations even and unlabored on ATC, few scattered rhonchi Abdomen: BS x 4, soft, NT/ND.  Musculoskeletal: No gross deformities, no edema.  Skin: Intact, warm, no rashes.  CBC:  Recent Labs Lab 11/12/16 0446  11/13/16 0235 11/15/16 0530 11/17/16 0441  WBC 8.1 14.3* 12.2* 12.0*  HGB 13.0 8.7* 8.7* 9.0*  HCT 41.2 27.3* 27.7* 28.5*  MCV 92.2 91.9 92.3 93.8  PLT 239 344 404* 638   Basic Metabolic Panel:  Recent Labs Lab 11/13/16 0235 11/14/16 0311 11/15/16 0530 11/17/16 0441 11/18/16 0500  NA 142 142 140 139 140  K 3.2* 3.4* 3.6 3.3* 4.4  CL 101 104 100* 98* 101  CO2 '29 28 29 30 29  '$ GLUCOSE 104* 116* 87 94 97  BUN '9 10 10 11 12  '$ CREATININE 0.71 0.63 0.69 0.63 0.61  CALCIUM 8.1* 8.2* 8.4* 8.4* 8.8*  MG  --  1.5* 1.6* 2.1  --    GFR: Estimated Creatinine Clearance: 104.5 mL/min (by C-G formula based on SCr of 0.61 mg/dL).  Liver Function Tests:  Recent Labs Lab 11/12/16 0446 11/18/16 0500  AST 21 17  ALT 27 19  ALKPHOS 51 55  BILITOT 0.4 <0.1*  PROT 5.4* 6.0*  ALBUMIN 1.5* 1.6*    HbA1C: Hgb A1c MFr Bld  Date/Time Value Ref Range Status  05/20/2016 04:20 AM 5.3 4.8 - 5.6 % Final    Comment:    (NOTE)         Pre-diabetes: 5.7 - 6.4         Diabetes: >6.4         Glycemic control for adults with diabetes: <7.0   03/27/2016 05:35 AM 5.8 (H) 4.8 - 5.6 % Final    Comment:    (NOTE)         Pre-diabetes: 5.7 - 6.4         Diabetes: >6.4         Glycemic control for adults with diabetes: <7.0     CBG:  Recent Labs Lab 11/17/16 1712 11/17/16 2003 11/17/16 2358 11/18/16 0423 11/18/16 0807  GLUCAP 119* 106* 121* 111* 106*    Scheduled Meds: . acetylcysteine  4 mL Nebulization Q6H  . alteplase  2 mg Intracatheter Once  . atorvastatin  20 mg Per Tube q1800  . budesonide (PULMICORT) nebulizer solution  0.5 mg Nebulization BID  . chlorhexidine gluconate (MEDLINE KIT)  15 mL Mouth Rinse BID  . clonazePAM  0.25 mg Per Tube Q6H  . famotidine  20 mg Per Tube BID  . fludrocortisone  0.1 mg Per Tube Daily  . folic acid  1 mg Per Tube Daily  . free water  200 mL Per Tube Q4H  . heparin  5,000 Units Subcutaneous Q8H  . hydrocortisone  10 mg Per Tube QHS  .  hydrocortisone  5 mg Per Tube Daily  . ipratropium  0.5 mg Nebulization QID  . levalbuterol  0.63 mg Nebulization QID  . levETIRAcetam  1,500 mg Per Tube BID  . mouth rinse  15 mL Mouth Rinse BID  . metoprolol tartrate  12.5 mg Per Tube BID  . QUEtiapine  50 mg Per Tube BID  . thiamine  100 mg Per Tube Daily  . Valproate Sodium  750 mg Per Tube Q8H   Continuous Infusions: . feeding supplement (VITAL AF 1.2 CAL) 1,000 mL (11/17/16 1823)     LOS: 19 days     [facility] Office  [phone] Pager - Text Page per Shea Evans as per below:  On-Call/Text Page:      [email or web address]      password [email or web address]  If 7PM-7AM, please contact night-coverage [email or web address] Password [email or web address] 11/18/2016, 9:08 AM

## 2016-11-19 ENCOUNTER — Inpatient Hospital Stay (HOSPITAL_COMMUNITY): Payer: Medicaid Other

## 2016-11-19 DIAGNOSIS — I480 Paroxysmal atrial fibrillation: Secondary | ICD-10-CM

## 2016-11-19 LAB — GLUCOSE, CAPILLARY
GLUCOSE-CAPILLARY: 89 mg/dL (ref 65–99)
Glucose-Capillary: 103 mg/dL — ABNORMAL HIGH (ref 65–99)
Glucose-Capillary: 106 mg/dL — ABNORMAL HIGH (ref 65–99)
Glucose-Capillary: 114 mg/dL — ABNORMAL HIGH (ref 65–99)
Glucose-Capillary: 124 mg/dL — ABNORMAL HIGH (ref 65–99)

## 2016-11-19 LAB — CULTURE, BAL-QUANTITATIVE W GRAM STAIN: Culture: 60000 — AB

## 2016-11-19 LAB — CULTURE, BAL-QUANTITATIVE

## 2016-11-19 MED ORDER — METOPROLOL TARTRATE 5 MG/5ML IV SOLN
5.0000 mg | Freq: Once | INTRAVENOUS | Status: AC
  Administered 2016-11-19: 5 mg via INTRAVENOUS

## 2016-11-19 MED ORDER — DILTIAZEM LOAD VIA INFUSION
10.0000 mg | Freq: Once | INTRAVENOUS | Status: AC
  Administered 2016-11-19: 10 mg via INTRAVENOUS
  Filled 2016-11-19: qty 10

## 2016-11-19 MED ORDER — DILTIAZEM LOAD VIA INFUSION: 10.0000 mg | Freq: Once | INTRAVENOUS | Status: DC

## 2016-11-19 MED ORDER — DILTIAZEM HCL 100 MG IV SOLR
5.0000 mg/h | INTRAVENOUS | Status: DC
  Administered 2016-11-19: 5 mg/h via INTRAVENOUS
  Filled 2016-11-19: qty 100

## 2016-11-19 MED ORDER — SODIUM CHLORIDE 0.9 % IV SOLN
250.0000 mL | INTRAVENOUS | Status: DC | PRN
  Administered 2016-11-26 – 2016-12-03 (×4): 250 mL via INTRAVENOUS

## 2016-11-19 MED ORDER — DIGOXIN 0.25 MG/ML IJ SOLN
0.2500 mg | Freq: Four times a day (QID) | INTRAMUSCULAR | Status: AC
  Administered 2016-11-19 (×2): 0.25 mg via INTRAVENOUS
  Filled 2016-11-19 (×3): qty 2

## 2016-11-19 MED ORDER — METOPROLOL TARTRATE 5 MG/5ML IV SOLN
INTRAVENOUS | Status: AC
  Filled 2016-11-19: qty 5

## 2016-11-19 MED ORDER — AMIODARONE HCL IN DEXTROSE 360-4.14 MG/200ML-% IV SOLN
30.0000 mg/h | INTRAVENOUS | Status: DC
  Administered 2016-11-19 – 2016-12-01 (×21): 30 mg/h via INTRAVENOUS
  Filled 2016-11-19 (×25): qty 200

## 2016-11-19 MED ORDER — METOPROLOL TARTRATE 5 MG/5ML IV SOLN
10.0000 mg | Freq: Four times a day (QID) | INTRAVENOUS | Status: DC
  Administered 2016-11-19 – 2016-12-07 (×62): 10 mg via INTRAVENOUS
  Filled 2016-11-19 (×67): qty 10

## 2016-11-19 MED ORDER — DIGOXIN 0.25 MG/ML IJ SOLN
0.5000 mg | Freq: Once | INTRAMUSCULAR | Status: AC
  Administered 2016-11-19: 0.5 mg via INTRAVENOUS
  Filled 2016-11-19: qty 2

## 2016-11-19 MED ORDER — METOPROLOL TARTRATE 5 MG/5ML IV SOLN
5.0000 mg | Freq: Once | INTRAVENOUS | Status: AC
  Administered 2016-11-19: 5 mg via INTRAVENOUS
  Filled 2016-11-19: qty 5

## 2016-11-19 MED ORDER — AMIODARONE LOAD VIA INFUSION
150.0000 mg | Freq: Once | INTRAVENOUS | Status: AC
  Administered 2016-11-19: 150 mg via INTRAVENOUS
  Filled 2016-11-19: qty 83.34

## 2016-11-19 MED ORDER — AMIODARONE HCL IN DEXTROSE 360-4.14 MG/200ML-% IV SOLN
60.0000 mg/h | INTRAVENOUS | Status: AC
  Administered 2016-11-19: 60 mg/h via INTRAVENOUS
  Filled 2016-11-19: qty 200

## 2016-11-19 NOTE — Progress Notes (Signed)
Pt was found to have a rectal temp. Of 94.5 during the 0000 vitas check.  Warm blankets applied to pt. MD notified.  Will continue to monitor.

## 2016-11-19 NOTE — Progress Notes (Signed)
MD paged regarding pts heart rate in 170s on amiodarone drip. MD ordered digoxin 0.5 mg and lopressor 10 mg IV. RN will continue to monitor.

## 2016-11-19 NOTE — Progress Notes (Signed)
  Amiodarone Drug - Drug Interaction Consult Note  Recommendations: Follow QTc closely  Amiodarone is metabolized by the cytochrome P450 system and therefore has the potential to cause many drug interactions. Amiodarone has an average plasma half-life of 50 days (range 20 to 100 days).   There is potential for drug interactions to occur several weeks or months after stopping treatment and the onset of drug interactions may be slow after initiating amiodarone.    [x]  Beta blockers: increased risk of bradycardia, AV block and myocardial depression. Sotalol - avoid concomitant use.  [x]  Drugs that prolong the QT interval:  Torsades de pointes risk may be increased with concurrent use - avoid if possible.  Monitor QTc, also keep magnesium/potassium WNL if concurrent therapy can't be avoided. Marland Kitchen. Antibiotics: e.g. fluoroquinolones, erythromycin. . Antiarrhythmics: e.g. quinidine, procainamide, disopyramide, sotalol. . Antipsychotics: e.g. phenothiazines, haloperidol.  . Lithium, tricyclic antidepressants, and methadone.  Thank You,  Lavonia Danamend, Pranish Akhavan George  11/19/2016 6:39 AM

## 2016-11-19 NOTE — Progress Notes (Signed)
[place] TEAM 1 - Stepdown/ICU TEAM  Dale [name]  ZSM:[id number] DOB: Mar 04, 1966 DOA: 10/14/2016 PCP: No PCP Per Patient    Brief Narrative:  50 yo M w/ severe neurological deficits after meningitis.  He was brought to the ED with decreased level of consciousness and nausea/vomiting/diarrhea. A CXR showed consolidation of the left upper lobe. He was intubated and transferred to [facility].  Significant Events: 11/26 Intubated (endotracheally) at [facility] ED - transferred to [facility]  11/29 bronch - left inspissated pus 12/14 bronchoscopy  Subjective: The patient converted to atrial fibrillation early this morning with tachycardia that has proven difficult to control.  Clinically he does not appear to be in any acute distress.  There is no evidence of severe dyspnea or uncontrolled pain at the time of my exam.  Assessment & Plan:  Acute hypoxic respiratory failure secondary to extensive L Lung Enterobacter HCAP w/ ARDS and Sepsis  Status post tracheostomy for the third time - trach care per PCCM with no plans to decannulate - sepsis and acute infection appear to have cleared - tolerating trach collar  Paroxysmal Atrial fibrillation Recurred this morning - now on IV amio + digoxin + BB - follow   L Lung white out Felt to be mucous plugging - no improvement s/p bronch 12/14 - no response to vibra-vest and mucolytics - ?very large effusion - f/u CXR in AM  Enterobacter cloacae left lung pneumonia w/ extensive infiltrate and atx of L lung Has completed a full course of antibiotic therapy - follow-up chest x-ray 12/12 noted complete opacification of L lung - PCCM has initiated attempts at mucolysis - may require bronch - tolerating remarkably well clinically   Coag neg Staph in 1 of 2 blood cx  Most c/w contaminant - no symptoms to suggest active bacteremia  Acute on chronic encephalopathy - 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 Mental status appears to be at his baseline   Chronic systolic  CHF EF 49% Aug 2010 - no evidence of volume overload at this time - weight is stable Filed Weights   11/17/16 0439 11/18/16 0400 11/19/16 0430  Weight: 69.9 kg (154 lb) 69.4 kg (153 lb) 70.3 kg (155 lb)    Adrenal insufficiency - Autonomic dysreflexia Continuing Florinef and Hydrocortisone   Hypernatremia Resolved with increase in free water via tube   Hypokalemia Corrected   Hypomagnesemia Recheck in AM   Severe malnutrition in context of chronic illness Continue tube feeds per PEG   DVT prophylaxis: SQ heparin  Code Status: FULL CODE Family Communication: no family present at time of exam  Disposition Plan: SDU - eventual trach capable SNF once L lung airation improved and stable   Consultants:  PCCM  Antimicrobials:  Vanc 11/26 > 11/29 Zosyn 11/26 > 12/1 Cipro 12/1 > 12/8 Rocephin 12/15 >  Objective: Blood pressure 125/81, pulse (!) 124, temperature 98.9 F (37.2 C), temperature source Axillary, resp. rate (!) 22, height _0  (1.727 m), weight 70.3 kg (155 lb), SpO2 99 %.  Intake/Output Summary (Last 24 hours) at 11/19/16 1643 Last data filed at 11/19/16 1600  Gross per 24 hour  Intake          2492.86 ml  Output              675 ml  Net          1817.86 ml   Filed Weights   11/17/16 0439 11/18/16 0400 11/19/16 0430  Weight: 69.9 kg (154 lb) 69.4 kg (153  lb) 70.3 kg (155 lb)    Examination: General: No acute respiratory distress evident  Lungs: distant BS th/o all fields - breath sounds noted on L  Cardiovascular: Tachycardic at approximately 110 bpm and irregular Abdomen: Nondistended, soft, bowel sounds positive Extremities: Stable trace edema B LE    CBC:  Recent Labs Lab 11/13/16 0235 11/15/16 0530 11/17/16 0441  WBC 14.3* 12.2* 12.0*  HGB 8.7* 8.7* 9.0*  HCT 27.3* 27.7* 28.5*  MCV 91.9 92.3 93.8  PLT 344 404* 233   Basic Metabolic Panel:  Recent Labs Lab 11/13/16 0235 11/14/16 0311 11/15/16 0530 11/17/16 0441 11/18/16 0500  NA  142 142 140 139 140  K 3.2* 3.4* 3.6 3.3* 4.4  CL 101 104 100* 98* 101  CO2 _0 GLUCOSE 104* 116* 87 94 97  BUN _1 CREATININE 0.71 0.63 0.69 0.63 0.61  CALCIUM 8.1* 8.2* 8.4* 8.4* 8.8*  MG  --  1.5* 1.6* 2.1  --    GFR: Estimated Creatinine Clearance: 104.5 mL/min (by C-G formula based on SCr of 0.61 mg/dL).  Liver Function Tests:  Recent Labs Lab 11/18/16 0500  AST 17  ALT 19  ALKPHOS 55  BILITOT <0.1*  PROT 6.0*  ALBUMIN 1.6*    HbA1C: Hgb A1c MFr Bld  Date/Time Value Ref Range Status  05/20/2016 04:20 AM 5.3 4.8 - 5.6 % Final    Comment:    (NOTE)         Pre-diabetes: 5.7 - 6.4         Diabetes: >6.4         Glycemic control for adults with diabetes: <7.0   03/27/2016 05:35 AM 5.8 (H) 4.8 - 5.6 % Final    Comment:    (NOTE)         Pre-diabetes: 5.7 - 6.4         Diabetes: >6.4         Glycemic control for adults with diabetes: <7.0     CBG:  Recent Labs Lab 11/18/16 2015 11/19/16 0009 11/19/16 0342 11/19/16 0805 11/19/16 1202  GLUCAP 105* 124* 114* 103* 106*    Scheduled Meds: . alteplase  2 mg Intracatheter Once  . atorvastatin  20 mg Per Tube q1800  . budesonide (PULMICORT) nebulizer solution  0.5 mg Nebulization BID  . cefTRIAXone (ROCEPHIN)  IV  1 g Intravenous Q24H  . chlorhexidine gluconate (MEDLINE KIT)  15 mL Mouth Rinse BID  . clonazePAM  0.25 mg Per Tube Q6H  . digoxin  0.25 mg Intravenous Q6H  . famotidine  20 mg Per Tube BID  . fludrocortisone  0.1 mg Per Tube Daily  . folic acid  1 mg Per Tube Daily  . free water  200 mL Per Tube Q4H  . heparin  5,000 Units Subcutaneous Q8H  . hydrocortisone  10 mg Per Tube QHS  . hydrocortisone  5 mg Per Tube Daily  . ipratropium  0.5 mg Nebulization QID  . levalbuterol  0.63 mg Nebulization QID  . levETIRAcetam  1,500 mg Per Tube BID  . mouth rinse  15 mL Mouth Rinse BID  . metoprolol  10 mg Intravenous Q6H  . QUEtiapine  50 mg Per Tube BID  . sodium chloride  HYPERTONIC  4 mL Nebulization BID  . thiamine  100 mg Per Tube Daily  . Valproate Sodium  750 mg Per Tube Q8H   Continuous Infusions: . amiodarone 30 mg/hr (11/19/16 1245)  .  feeding supplement (VITAL AF 1.2 CAL) 1,000 mL (11/19/16 0812)     LOS: 20 days   Cherene Altes, MD [facility] Office  [phone] Pager - Text Page per Amion as per below:  On-Call/Text Page:      [email or web address]      password [email or web address]  If 7PM-7AM, please contact night-coverage [email or web address] Password [facility] 11/19/2016, 4:43 PM

## 2016-11-19 NOTE — Progress Notes (Signed)
Called to pt room by CCMD to alert that the pt had gone into Afib RVR during chest PT.  HR sustained 140-160, spikes into low 200's.  Obtained 12 lead EKG. Called MD for orders. Was advised to start cardizem gtt.  Began drip and called RRT to support.  Upon conversing with RRT it was suggested that due to the acute nature of this event it was acceptable to titrate the cardizem Q15 min. As long as BP would support. Will continue to monitor.

## 2016-11-19 NOTE — Progress Notes (Signed)
CPT was not done due to high heart rate

## 2016-11-19 NOTE — Significant Event (Signed)
Rapid Response Event Note Called per floor RN to place Pt on RRT radar regarding reoccurring transient Afib RVR following agitation with bedside chest PT. [facility] NP already contacted and orders received for Cardizem gtt and infusing at 5 mg/hr. RN advised to titrate cardizem up, placed on radar.  Overview: Time Called: 0430 Arrival Time: 0445 Event Type: Cardiac  Initial Focused Assessment: Patient found in bed, restless. Does not follow commands but makes eyes contact. MAEW x 4. Per floor RN this is his baseline. Lungs congested in bilateral upper lobes, white frothy secretions suctioned with yankeur.  HR 170-190s BP stable.   Interventions: Cardizem titrated to [id number]$ /hr. [facility] NP paged to update, 5 mg lopressor IVP ordered, given at 0500. 50 mcg fentanyl given for agitation/?pain. HR improved to 150-160s afib. Pt appears to be more comfortable. Daphane ShepherdM. Lynch NP paged and I discussed with her Pt current status and some improvement with sedation and lopressor. Suggested Cardiology consult in AM. Additional 5 mg lopressor IVP ordered and given at 0554.   Plan of Care (if not transferred): RN to monitor Pt and update [facility] NP within the hour if HR does not improve for additional orders. To call RRT if needed. Will follow Pt today.   Event Summary: Name of Physician Notified: M. Burnadette PeterLynch [facility] NP several pages sent prior to and after my arrival at      at    Outcome: Stayed in room and stabalized     White, J[employer] & CoBrooke Leigh Jnai [place]

## 2016-11-19 NOTE — Progress Notes (Signed)
[employer] blanket arrived from [employer].  Applied to pt. Current temp. 93.9 rectal. Will recheck temp. in 1 hour and continue to monitor.

## 2016-11-19 NOTE — Progress Notes (Signed)
Called to follow up with MD and to update that the ptwas not responding to cardizem and that there was some limited benefit from the lopressor.  MD ordered to convert from cardizem to amiodarone.  Began amio bolus as ordered.  Will continue to monitor.

## 2016-11-20 ENCOUNTER — Inpatient Hospital Stay (HOSPITAL_COMMUNITY): Payer: Medicaid Other

## 2016-11-20 LAB — COMPREHENSIVE METABOLIC PANEL
ALT: 19 U/L (ref 17–63)
ANION GAP: 13 (ref 5–15)
AST: 15 U/L (ref 15–41)
Albumin: 1.5 g/dL — ABNORMAL LOW (ref 3.5–5.0)
Alkaline Phosphatase: 50 U/L (ref 38–126)
BILIRUBIN TOTAL: 0.6 mg/dL (ref 0.3–1.2)
BUN: 12 mg/dL (ref 6–20)
CALCIUM: 8.7 mg/dL — AB (ref 8.9–10.3)
CO2: 30 mmol/L (ref 22–32)
Chloride: 100 mmol/L — ABNORMAL LOW (ref 101–111)
Creatinine, Ser: 0.74 mg/dL (ref 0.61–1.24)
GLUCOSE: 93 mg/dL (ref 65–99)
POTASSIUM: 3.6 mmol/L (ref 3.5–5.1)
Sodium: 143 mmol/L (ref 135–145)
TOTAL PROTEIN: 6.4 g/dL — AB (ref 6.5–8.1)

## 2016-11-20 LAB — CBC
HEMATOCRIT: 26.9 % — AB (ref 39.0–52.0)
HEMOGLOBIN: 8.4 g/dL — AB (ref 13.0–17.0)
MCH: 28.6 pg (ref 26.0–34.0)
MCHC: 31.2 g/dL (ref 30.0–36.0)
MCV: 91.5 fL (ref 78.0–100.0)
Platelets: 395 10*3/uL (ref 150–400)
RBC: 2.94 MIL/uL — ABNORMAL LOW (ref 4.22–5.81)
RDW: 17.5 % — ABNORMAL HIGH (ref 11.5–15.5)
WBC: 12.6 10*3/uL — AB (ref 4.0–10.5)

## 2016-11-20 LAB — GLUCOSE, CAPILLARY
GLUCOSE-CAPILLARY: 100 mg/dL — AB (ref 65–99)
GLUCOSE-CAPILLARY: 115 mg/dL — AB (ref 65–99)
GLUCOSE-CAPILLARY: 99 mg/dL (ref 65–99)
Glucose-Capillary: 98 mg/dL (ref 65–99)
Glucose-Capillary: 92 mg/dL (ref 65–99)
Glucose-Capillary: 100 mg/dL — ABNORMAL HIGH (ref 65–99)
Glucose-Capillary: 105 mg/dL — ABNORMAL HIGH (ref 65–99)

## 2016-11-20 LAB — MAGNESIUM: MAGNESIUM: 1.9 mg/dL (ref 1.7–2.4)

## 2016-11-20 LAB — TSH: TSH: 1.517 u[IU]/mL (ref 0.350–4.500)

## 2016-11-20 NOTE — Progress Notes (Signed)
Pt pulled off trach collar sats down to 78 briefly suctioned given fentanyl IV Lt arm extremely strong hard to restrain it while getting pt repositioned and straightened out in bed Sat now back to 96 %

## 2016-11-20 NOTE — Progress Notes (Signed)
[place] TEAM 1 - Stepdown/ICU TEAM  Dale [name]  JYN:[id number] DOB: 1966/05/28 DOA: 10/31/2016 PCP: No PCP Per Patient    Brief Narrative:  50 yo M w/ severe neurological deficits after meningitis.  He was brought to the ED with decreased level of consciousness and nausea/vomiting/diarrhea. A CXR showed consolidation of the left upper lobe. He was intubated and transferred to [facility].  Significant Events: 11/26 Intubated (endotracheally) at [facility] ED - transferred to [facility]  11/29 bronch - left inspissated pus 12/14 bronchoscopy  Subjective: The patient's heart rate is much better controlled today and he appears to be in sinus tachycardia again.  He has had periods of agitation secondary but is resting comfortably at time of visit.  Assessment & Plan:  Acute hypoxic respiratory failure secondary to extensive L Lung Enterobacter HCAP w/ ARDS and Sepsis  Status post tracheostomy for the third time - trach care per PCCM with no plans to decannulate - sepsis and acute infection appear to have cleared - tolerating trach collar  Paroxysmal Atrial fibrillation rate better controlled - stop digoxin - follow without other change for now  L Lung white out Initially felt to be mucous plugging - no improvement s/p bronch 12/14 - no response to vibra-vest and mucolytics - ?very large effusion - f/u CXR today w/o change - will discuss possible thoracentesis w/ PCCM in AM   Enterobacter cloacae left lung pneumonia w/ extensive infiltrate and atx of L lung Has completed a full course of antibiotic therapy - follow-up chest x-ray 12/12 noted complete opacification of L lung - PCCM initiated attempts at mucolysis w/o improvement   Coag neg Staph in 1 of 2 blood cx  Most c/w contaminant - no symptoms to suggest active bacteremia  Acute on chronic encephalopathy - 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 Mental status appears to be at his baseline   Chronic systolic CHF EF 86% Aug 5784 - no evidence of  volume overload at this time - weight is stable Filed Weights   11/18/16 0400 11/19/16 0430 11/20/16 0428  Weight: 69.4 kg (153 lb) 70.3 kg (155 lb) 64.9 kg (143 lb)    Adrenal insufficiency - Autonomic dysreflexia Continuing Florinef and Hydrocortisone   Hypernatremia Resolved with increase in free water via tube   Hypokalemia Corrected   Hypomagnesemia Corrected   Severe malnutrition in context of chronic illness Continue tube feeds per PEG   DVT prophylaxis: SQ heparin  Code Status: FULL CODE Family Communication: no family present at time of exam  Disposition Plan: SDU - eventual trach capable SNF once L lung airation improved and stable   Consultants:  PCCM  Antimicrobials:  Vanc 11/26 > 11/29 Zosyn 11/26 > 12/1 Cipro 12/1 > 12/8 Rocephin 12/15 >  Objective: Blood pressure (!) 155/109, pulse (!) 107, temperature 98 F (36.7 C), temperature source Oral, resp. rate (!) 21, height '5\' 8"'$  (1.727 m), weight 64.9 kg (143 lb), SpO2 96 %.  Intake/Output Summary (Last 24 hours) at 11/20/16 1633 Last data filed at 11/20/16 1432  Gross per 24 hour  Intake             2634 ml  Output             4325 ml  Net            -1691 ml   Filed Weights   11/18/16 0400 11/19/16 0430 11/20/16 0428  Weight: 69.4 kg (153 lb) 70.3 kg (155 lb) 64.9 kg (143 lb)  Examination: General: No acute respiratory distress  Lungs: distant BS th/o all fields - breath sounds are noted on L  Cardiovascular: Tachycardic at 100bpm but regular  Abdomen: Nondistended, soft, bowel sounds positive Extremities: trace edema B LE    CBC:  Recent Labs Lab 11/15/16 0530 11/17/16 0441 11/20/16 0453  WBC 12.2* 12.0* 12.6*  HGB 8.7* 9.0* 8.4*  HCT 27.7* 28.5* 26.9*  MCV 92.3 93.8 91.5  PLT 404* 383 301   Basic Metabolic Panel:  Recent Labs Lab 11/14/16 0311 11/15/16 0530 11/17/16 0441 11/18/16 0500 11/20/16 0453  NA 142 140 139 140 143  K 3.4* 3.6 3.3* 4.4 3.6  CL 104 100* 98* 101  100*  CO2 '28 29 30 29 30  '$ GLUCOSE 116* 87 94 97 93  BUN '10 10 11 12 12  '$ CREATININE 0.63 0.69 0.63 0.61 0.74  CALCIUM 8.2* 8.4* 8.4* 8.8* 8.7*  MG 1.5* 1.6* 2.1  --  1.9   GFR: Estimated Creatinine Clearance: 99.2 mL/min (by C-G formula based on SCr of 0.74 mg/dL).  Liver Function Tests:  Recent Labs Lab 11/18/16 0500 11/20/16 0453  AST 17 15  ALT 19 19  ALKPHOS 55 50  BILITOT <0.1* 0.6  PROT 6.0* 6.4*  ALBUMIN 1.6* 1.5*    HbA1C: Hgb A1c MFr Bld  Date/Time Value Ref Range Status  05/20/2016 04:20 AM 5.3 4.8 - 5.6 % Final    Comment:    (NOTE)         Pre-diabetes: 5.7 - 6.4         Diabetes: >6.4         Glycemic control for adults with diabetes: <7.0   03/27/2016 05:35 AM 5.8 (H) 4.8 - 5.6 % Final    Comment:    (NOTE)         Pre-diabetes: 5.7 - 6.4         Diabetes: >6.4         Glycemic control for adults with diabetes: <7.0     CBG:  Recent Labs Lab 11/19/16 2121 11/20/16 0124 11/20/16 0425 11/20/16 0758 11/20/16 1205  GLUCAP 89 115* 98 92 100*    Scheduled Meds: . atorvastatin  20 mg Per Tube q1800  . budesonide (PULMICORT) nebulizer solution  0.5 mg Nebulization BID  . cefTRIAXone (ROCEPHIN)  IV  1 g Intravenous Q24H  . chlorhexidine gluconate (MEDLINE KIT)  15 mL Mouth Rinse BID  . clonazePAM  0.25 mg Per Tube Q6H  . famotidine  20 mg Per Tube BID  . fludrocortisone  0.1 mg Per Tube Daily  . folic acid  1 mg Per Tube Daily  . free water  200 mL Per Tube Q4H  . heparin  5,000 Units Subcutaneous Q8H  . hydrocortisone  10 mg Per Tube QHS  . hydrocortisone  5 mg Per Tube Daily  . ipratropium  0.5 mg Nebulization QID  . levalbuterol  0.63 mg Nebulization QID  . levETIRAcetam  1,500 mg Per Tube BID  . mouth rinse  15 mL Mouth Rinse BID  . metoprolol  10 mg Intravenous Q6H  . QUEtiapine  50 mg Per Tube BID  . sodium chloride HYPERTONIC  4 mL Nebulization BID  . thiamine  100 mg Per Tube Daily  . Valproate Sodium  750 mg Per Tube Q8H    Continuous Infusions: . amiodarone 30 mg/hr (11/20/16 1625)  . feeding supplement (VITAL AF 1.2 CAL) 1,000 mL (11/19/16 2356)     LOS: 21 days  Cherene Altes, MD [facility] Office  [phone] Pager - Text Page per Amion as per below:  On-Call/Text Page:      [email or web address]      password [email or web address]  If 7PM-7AM, please contact night-coverage [email or web address] Password [facility] 11/20/2016, 4:33 PM

## 2016-11-20 NOTE — Progress Notes (Signed)
Replaced trach set up

## 2016-11-21 ENCOUNTER — Inpatient Hospital Stay (HOSPITAL_COMMUNITY): Payer: Medicaid Other

## 2016-11-21 DIAGNOSIS — E271 Primary adrenocortical insufficiency: Secondary | ICD-10-CM

## 2016-11-21 LAB — BLOOD GAS, ARTERIAL
ACID-BASE EXCESS: 8.8 mmol/L — AB (ref 0.0–2.0)
BICARBONATE: 33.1 mmol/L — AB (ref 20.0–28.0)
Drawn by: 277551
FIO2: 40
LHR: 20 {breaths}/min
MECHVT: 420 mL
O2 SAT: 91.6 %
PEEP/CPAP: 5 cmH2O
PH ART: 7.452 — AB (ref 7.350–7.450)
PO2 ART: 66.6 mmHg — AB (ref 83.0–108.0)
Patient temperature: 98.6
pCO2 arterial: 48.1 mmHg — ABNORMAL HIGH (ref 32.0–48.0)

## 2016-11-21 LAB — GLUCOSE, CAPILLARY
GLUCOSE-CAPILLARY: 122 mg/dL — AB (ref 65–99)
GLUCOSE-CAPILLARY: 94 mg/dL (ref 65–99)
Glucose-Capillary: 105 mg/dL — ABNORMAL HIGH (ref 65–99)
Glucose-Capillary: 121 mg/dL — ABNORMAL HIGH (ref 65–99)
Glucose-Capillary: 101 mg/dL — ABNORMAL HIGH (ref 65–99)

## 2016-11-21 LAB — BASIC METABOLIC PANEL
Anion gap: 12 (ref 5–15)
BUN: 12 mg/dL (ref 6–20)
CHLORIDE: 101 mmol/L (ref 101–111)
CO2: 31 mmol/L (ref 22–32)
Calcium: 8.6 mg/dL — ABNORMAL LOW (ref 8.9–10.3)
Creatinine, Ser: 0.75 mg/dL (ref 0.61–1.24)
GFR calc Af Amer: >60 mL/min (ref >60)
GFR calc non Af Amer: >60 mL/min (ref >60)
Glucose, Bld: 113 mg/dL — ABNORMAL HIGH (ref 65–99)
POTASSIUM: 3.3 mmol/L — AB (ref 3.5–5.1)
SODIUM: 144 mmol/L (ref 135–145)

## 2016-11-21 MED ORDER — POTASSIUM CHLORIDE 20 MEQ/15ML (10%) PO SOLN
40.0000 meq | Freq: Two times a day (BID) | ORAL | Status: AC
  Administered 2016-11-21 (×2): 40 meq via ORAL
  Filled 2016-11-21 (×2): qty 30

## 2016-11-21 MED ORDER — CHLORHEXIDINE GLUCONATE 0.12% ORAL RINSE (MEDLINE KIT)
15.0000 mL | Freq: Two times a day (BID) | OROMUCOSAL | Status: DC
  Administered 2016-11-21 – 2016-12-23 (×60): 15 mL via OROMUCOSAL

## 2016-11-21 MED ORDER — ORAL CARE MOUTH RINSE
15.0000 mL | Freq: Four times a day (QID) | OROMUCOSAL | Status: DC
  Administered 2016-11-22 – 2016-12-24 (×120): 15 mL via OROMUCOSAL

## 2016-11-21 NOTE — Progress Notes (Addendum)
CSW will continue to follow patient for placement needs- patient currently requiring vent support and is not stable [facility]- CSW updated facility that patient is not stable for return at this time  Burna SisJenna H. Hamzah Savoca, LCSW Clinical Social Worker [phone]

## 2016-11-21 NOTE — Progress Notes (Addendum)
Placed back on Vent - many oral secretions today and harder work breathing. Pt very restless - giving Fentanyl for pain and restlessness.

## 2016-11-21 NOTE — Progress Notes (Signed)
[facility] PROGRESS NOTE    Progress Note  MANVEER GOMES  ATF:[id number] DOB: [birth year] DOA: 10/24/2016 PCP: No PCP Per Patient     Brief Narrative:   Dale [name] is an 50 y.o. male past medical history severe neurological deficits after meningitis, was brought to the ED due to decreased level of consciousness with nausea vomiting and diarrhea, chest x-ray showed consolidation of the left upper lobe was intubated  Assessment/Plan:   Significant Events: 11/26 Intubated (endotracheally) at [facility] ED - [facility]  11/29 bronch - left inspissated pus  Acute respiratory failure with hypoxia secondary to extensive left lung Enterobacter pneumonia leading to ARDS and sepsis: He is status post tracheostomy for the third time, trachea per PCCM no plans to decannulate seen. Pulmonary critical care is on board, continue IV Rocephin as above for [place]. Cont chest therapy  Paroxysmal atrial fibrillation: Rate better controlled. Now IV amiodarone, digoxin and beta blocker.  Left lung white out: Felt to be a mucous plug bronchoscopy and 11/17/2016 continue best mucolytic's. Chest x-ray this morning shows persistence water, critical care will attempt guided thoracocentesis.  Enterobacter cloacae left lung pneumonia w/ extensive infiltrate and atx of L lung: Has completed a full course of antibiotic therapy - follow-up chest x-ray 12/12 noted complete opacification of L lung - now resolved.  Staph coagulase -1 or 2 blood cultures: Due to contaminated. Next  Acute on chronic encephalopathy: He has a history of devastating meningitis mental status appears to be at baseline  Chronic systolic heart failure: Linear 20% on August 2017 seems to be euvolemic.  Adrenal insufficiency (Addison's disease) ([place]): Continue Florinef and hydrocortisone. Hypernatremia: Resolved with free water per tube.  Hypokalemia/hypomagnesemia: Directive.  Severe caloric  malnutrition: Continue tube feedings.     DVT prophylaxis: lovenox Family Communication:none Disposition Plan/Barrier to D/C: unable to determine Code Status:     Code Status Orders        Start     Ordered   10/19/2016 2210  Full code  Continuous     11/01/2016 2211    Code Status History    Date Active Date Inactive Code Status Order ID Comments User Context   07/12/2016 10:38 PM 08/10/2016  5:47 PM Full Code [id number]  Flordell Hills, PA-C Inpatient   06/16/2016 10:27 PM 07/11/2016  6:44 PM Full Code [id number]  Brand Males, MD ED   05/20/2016  1:54 AM 06/01/2016  9:21 PM Full Code [id number]  Javier Glazier, MD ED   05/19/2016  8:48 PM 05/20/2016  1:54 AM Full Code [id number]  Etta Quill, DO ED   04/27/2016 11:23 PM 04/29/2016 10:36 PM Full Code [id number]  Edwin Dada, MD Inpatient   03/20/2016  9:35 PM 04/06/2016  1:47 AM Full Code [id number]  Dannielle Burn, MD ED   02/17/2016  1:31 PM 03/10/2016  6:52 PM DNR [id number]  Raylene Miyamoto, MD Inpatient   02/16/2016  7:13 PM 02/17/2016  1:31 PM Full Code [id number]  Rahul Dianna Rossetti, PA-C ED        IV Access:    Peripheral IV   Procedures and diagnostic studies:   Dg Chest Portable 1 View  Result Date: 11/21/2016 CLINICAL DATA:  Pleural effusion.  Tracheostomy tube . EXAM: PORTABLE CHEST 1 VIEW COMPARISON:  11/20/2016. FINDINGS: Tracheostomy tube noted in stable position. Right PICC line noted in stable position. Complete opacification of the left hemithorax again noted. Shift of the mediastinum heart the right noted. These  findings again suggest a left pleural effusion. Underlying left lung consolidation cannot be excluded. IMPRESSION: 1. Tracheostomy tube in stable position. 2. Unchanged chest with complete opacification of the left hemithorax. Findings consistent with left pleural effusion. Underlying left lung consolidation cannot be excluded . Electronically Signed   By: Marcello Moores  Register   On: 11/21/2016 10:24   Dg  Chest Port 1 View  Result Date: 11/20/2016 CLINICAL DATA:  Pleural effusion versus mucous plugging. Left lung white out. EXAM: PORTABLE CHEST 1 VIEW COMPARISON:  11/19/2016 FINDINGS: Tracheostomy tube and right PICC line remain in place, unchanged. Complete opacification of the left hemithorax again noted with shift of the mediastinal structures to the right suggesting at least component of pleural effusion. No confluent opacity on the right. IMPRESSION: No change since prior study. Electronically Signed   By: Rolm Baptise M.D.   On: 11/20/2016 08:08     Medical Consultants:    None.  Anti-Infectives:   rocephin  Subjective:    Gwenith Daily nonverbal.  Objective:    Vitals:   11/21/16 0600 11/21/16 0824 11/21/16 0900 11/21/16 1042  BP: (!) 171/103 (!) 179/101  104/74  Pulse: (!) 111  100 (!) 104  Resp: 16  13 (!) 28  Temp:  99.1 F (37.3 C)    TempSrc:  Oral    SpO2: 93%  92% 96%  Weight:      Height:        Intake/Output Summary (Last 24 hours) at 11/21/16 1049 Last data filed at 11/21/16 0512  Gross per 24 hour  Intake          2552.47 ml  Output             2251 ml  Net           301.47 ml   Filed Weights   11/19/16 0430 11/20/16 0428 11/21/16 0000  Weight: 70.3 kg (155 lb) 64.9 kg (143 lb) 65.3 kg (144 lb)    Exam: General exam: In no acute distress. Respiratory system: Good air movement and clear to auscultation. Cardiovascular system: S1 & S2 heard, RRR. No JVD Gastrointestinal system: Abdomen is nondistended, soft and nontender.  Extremities: No pedal edema. Skin: No rashes, lesions or ulcers    Data Reviewed:    Labs: Basic Metabolic Panel:  Recent Labs Lab 11/15/16 0530 11/17/16 0441 11/18/16 0500 11/20/16 0453 11/21/16 0442  NA 140 139 140 143 144  K 3.6 3.3* 4.4 3.6 3.3*  CL 100* 98* 101 100* 101  CO2 '29 30 29 30 31  '$ GLUCOSE 87 94 97 93 113*  BUN '10 11 12 12 12  '$ CREATININE 0.69 0.63 0.61 0.74 0.75  CALCIUM 8.4* 8.4* 8.8* 8.7*  8.6*  MG 1.6* 2.1  --  1.9  --    GFR Estimated Creatinine Clearance: 99.8 mL/min (by C-G formula based on SCr of 0.75 mg/dL). Liver Function Tests:  Recent Labs Lab 11/18/16 0500 11/20/16 0453  AST 17 15  ALT 19 19  ALKPHOS 55 50  BILITOT <0.1* 0.6  PROT 6.0* 6.4*  ALBUMIN 1.6* 1.5*   No results for input(s): LIPASE, AMYLASE in the last 168 hours. No results for input(s): AMMONIA in the last 168 hours. Coagulation profile No results for input(s): INR, PROTIME in the last 168 hours.  CBC:  Recent Labs Lab 11/15/16 0530 11/17/16 0441 11/20/16 0453  WBC 12.2* 12.0* 12.6*  HGB 8.7* 9.0* 8.4*  HCT 27.7* 28.5* 26.9*  MCV 92.3 93.8 91.5  PLT 404* 383 395   Cardiac Enzymes: No results for input(s): CKTOTAL, CKMB, CKMBINDEX, TROPONINI in the last 168 hours. BNP (last 3 results) No results for input(s): PROBNP in the last 8760 hours. CBG:  Recent Labs Lab 11/20/16 1654 11/20/16 1957 11/21/16 0000 11/21/16 0425 11/21/16 0822  GLUCAP 100* 105* 99 122* 105*   D-Dimer: No results for input(s): DDIMER in the last 72 hours. Hgb A1c: No results for input(s): HGBA1C in the last 72 hours. Lipid Profile: No results for input(s): CHOL, HDL, LDLCALC, TRIG, CHOLHDL, LDLDIRECT in the last 72 hours. Thyroid function studies:  Recent Labs  11/20/16 0453  TSH 1.517   Anemia work up: No results for input(s): VITAMINB12, FOLATE, FERRITIN, TIBC, IRON, RETICCTPCT in the last 72 hours. Sepsis Labs:  Recent Labs Lab 11/15/16 0530 11/17/16 0441 11/20/16 0453  WBC 12.2* 12.0* 12.6*   Microbiology Recent Results (from the past 240 hour(s))  Culture, bal-quantitative     Status: Abnormal   Collection Time: 11/17/16  1:51 PM  Result Value Ref Range Status   Specimen Description BRONCHIAL ALVEOLAR LAVAGE  Final   Special Requests NONE  Final   Gram Stain   Final    FEW WBC PRESENT, PREDOMINANTLY PMN FEW GRAM POSITIVE RODS RARE GRAM POSITIVE COCCI IN CLUSTERS     Culture (A)  Final    60,000 COLONIES/mL PROTEUS MIRABILIS 30,000 COLONIES/mL GROUP B STREP(S.AGALACTIAE)ISOLATED TESTING AGAINST S. AGALACTIAE NOT ROUTINELY PERFORMED DUE TO PREDICTABILITY OF AMP/PEN/VAN SUSCEPTIBILITY.    Report Status 11/19/2016 FINAL  Final   Organism ID, Bacteria PROTEUS MIRABILIS (A)  Final      Susceptibility   Proteus mirabilis - MIC*    AMPICILLIN <=2 SENSITIVE Sensitive     CEFAZOLIN <=4 SENSITIVE Sensitive     CEFEPIME <=1 SENSITIVE Sensitive     CEFTAZIDIME <=1 SENSITIVE Sensitive     CEFTRIAXONE <=1 SENSITIVE Sensitive     CIPROFLOXACIN >=4 RESISTANT Resistant     GENTAMICIN <=1 SENSITIVE Sensitive     IMIPENEM 4 SENSITIVE Sensitive     TRIMETH/SULFA <=20 SENSITIVE Sensitive     AMPICILLIN/SULBACTAM <=2 SENSITIVE Sensitive     PIP/TAZO <=4 SENSITIVE Sensitive     * 60,000 COLONIES/mL PROTEUS MIRABILIS     Medications:   . atorvastatin  20 mg Per Tube q1800  . budesonide (PULMICORT) nebulizer solution  0.5 mg Nebulization BID  . cefTRIAXone (ROCEPHIN)  IV  1 g Intravenous Q24H  . chlorhexidine gluconate (MEDLINE KIT)  15 mL Mouth Rinse BID  . clonazePAM  0.25 mg Per Tube Q6H  . famotidine  20 mg Per Tube BID  . fludrocortisone  0.1 mg Per Tube Daily  . folic acid  1 mg Per Tube Daily  . free water  200 mL Per Tube Q4H  . heparin  5,000 Units Subcutaneous Q8H  . hydrocortisone  10 mg Per Tube QHS  . hydrocortisone  5 mg Per Tube Daily  . ipratropium  0.5 mg Nebulization QID  . levalbuterol  0.63 mg Nebulization QID  . levETIRAcetam  1,500 mg Per Tube BID  . mouth rinse  15 mL Mouth Rinse BID  . metoprolol  10 mg Intravenous Q6H  . QUEtiapine  50 mg Per Tube BID  . thiamine  100 mg Per Tube Daily  . Valproate Sodium  750 mg Per Tube Q8H   Continuous Infusions: . amiodarone 30 mg/hr (11/21/16 0149)  . feeding supplement (VITAL AF 1.2 CAL) 1,000 mL (11/20/16 1812)  Time spent: 25 min   LOS: 22 days   Charlynne Cousins  [facility] Pager [phone]  *Please refer to [email or web address], password [email or web address] to get updated schedule on who will round on this patient, as hospitalists switch teams weekly. If 7PM-7AM, please contact night-coverage at [email or web address], password [email or web address] for any overnight needs.  11/21/2016, 10:49 AM

## 2016-11-21 NOTE — Progress Notes (Signed)
PULMONARY / CRITICAL CARE MEDICINE   Name: Dale [name] D Weckerly MRN: [id number] DOB: 01/03/1966    ADMISSION DATE:  29-Nov-2016  CHIEF COMPLAINT:  Nausea and Vomiting  50 yom w/ severe neurological deficits after meningitis.  He was brought to the ED with decreased level of consciousness and nausea/vomiting/diarrhea. A CXR showed consolidation of the left upper lobe. [facility] was intubated and [facility]   SUBJECTIVE:  No improvement. Worsening respiratory status, increased WOB.  S/p FOB 12/14, CXR 12/17 still with complete opacification L hemithorax.   Proteus in BAL from 12/14.   VITAL SIGNS: BP (!) 179/101 (BP Location: Left Arm)   Pulse 100   Temp 99.1 F (37.3 C) (Oral)   Resp 13   Ht 5\' 8"  (1.727 m)   Wt 65.3 kg (144 lb)   SpO2 92%   BMI 21.90 kg/m    VENTILATOR SETTINGS: FiO2 (%):  [35 %-55 %] 55 %  INTAKE / OUTPUT: I/O last 3 completed shifts: In: 4446.9 [I.V.:558.9; NG/GT:3888] Out: 5226 [Urine:5225; Stool:1]  PHYSICAL EXAMINATION: General: Middle aged male, chronically ill appearing, not interactive  Neuro: eyes open.  CN grossly intact. (-) lateralizing signs. HEENT: [place]/AT. PERRL, sclerae anicteric. Cardiovascular: RRR, no M/R/G.  Lungs: resps even, mildly labored on ATC, scattered rhonchi, decreased on left  Abdomen: BS x 4, soft, NT/ND.  Musculoskeletal: No gross deformities, no edema.  Skin: Intact, warm, no rashes.  LABS:  BMET  Recent Labs Lab 11/18/16 0500 11/20/16 0453 11/21/16 0442  NA 140 143 144  K 4.4 3.6 3.3*  CL 101 100* 101  CO2 29 30 31   BUN 12 12 12   CREATININE 0.61 0.74 0.75  GLUCOSE 97 93 113*   Electrolytes  Recent Labs Lab 11/15/16 0530 11/17/16 0441 11/18/16 0500 11/20/16 0453 11/21/16 0442  CALCIUM 8.4* 8.4* 8.8* 8.7* 8.6*  MG 1.6* 2.1  --  1.9  --     CBC  Recent Labs Lab 11/15/16 0530 11/17/16 0441 11/20/16 0453  WBC 12.2* 12.0* 12.6*  HGB 8.7* 9.0* 8.4*  HCT 27.7* 28.5* 26.9*  PLT 404* 383 395     Coag's No results for input(s): APTT, INR in the last 168 hours.  Sepsis Markers No results for input(s): LATICACIDVEN, PROCALCITON, O2SATVEN in the last 168 hours.  ABG No results for input(s): PHART, PCO2ART, PO2ART in the last 168 hours.  Liver Enzymes  Recent Labs Lab 11/18/16 0500 11/20/16 0453  AST 17 15  ALT 19 19  ALKPHOS 55 50  BILITOT <0.1* 0.6  ALBUMIN 1.6* 1.5*    Cardiac Enzymes No results for input(s): TROPONINI, PROBNP in the last 168 hours.  Glucose  Recent Labs Lab 11/20/16 1205 11/20/16 1654 11/20/16 1957 11/21/16 0000 11/21/16 0425 11/21/16 0822  GLUCAP 100* 100* 105* 99 122* 105*    Imaging No results found.   STUDIES:  CXR 11/26 >> LUL PNA EEG 12/5: negative for seizure   CULTURES: Blood 11/26 >> coag neg staph 1/2  C. Diff 11/26 >> (-) resp 11/27 >>  Enterobacter cloacae BAL 12/14: GPR, GPC clusters>>>proteus (RES cipro otherwise sens)   ANTIBIOTICS: Vanc 11/26 >>11/30 Pip-tazo 11/26 >>12/1 Cipro 12/1 > 12/8 Ceftriaxone 12/15>>>   SIGNIFICANT EVENTS: Intubated ([facility]otracheally) at [facility] ED 11/26 11/29 bronch - left inspissated pus  LINES/TUBES: PEG PICC 11/27  RUE >> Foley Trache 12/1   DISCUSSION: 50 y/o man s/p devastating meningitis, chronic debility, p/w fever, AMS & severe L HCAP vs aspiration.TThis has been treated.  He is trach/PEG  dependent. Mucous plugging has been most recent issue. I see this as being a chronic and recurrent issue. We did obtain a BAL on 12/14 during his FOB. Prelim worrisome for staph. If spikes fever or WBC climbs would have low threshold to rx as HCAP.    ASSESSMENT / PLAN:   Acute resp failure/ ARDS/ L HCAP with Enterobacter cloacae - s/p abx.  Worsening resp status 12/18.  Left lung atelectasis/collapse d/t mucous plugging  Hx of trach x2. Trached for the 3rd time on 12/1 P:   Place back on vent now - d/w RT  Vent support - 8cc/kg  F/u CXR now - may need to eval chest with u/s  for thoracentesis  F/u ABG NO Decannulation ever Cont neb meds.  ceftriaxone as above for proteus  Continue chest PT. Trend fever and WBC curve Eventually will need to change back to cuffless trach as can't go to facility w/ cuffed trach   Other medical issues per [facility]   FAMILY  - Updates: Needs trach v vent SNF   Dirk DressKaty Whiteheart, NP 11/21/2016  9:46 AM Pager: [phone] or [phone]) [phone]   STAFF NOTE: I, Rory Percyaniel Feinstein, MD FACP have personally reviewed patient's available data, including medical history, events of note, physical examination and test results as part of my evaluation. I have discussed with resident/NP and other care providers such as pharmacist, RN and RRT. In addition, I personally evaluated patient and elicited key findings of: poor neuro response, ronchi and reduced, with distress this am , back to vent recommended, pcxr has not changed even after bronch, airway appears to be pushed over to right, possible effusion, still mucous obstruction is possible, will CT chest, peep to 8-10 to recrtui, muco,mysts, chest pt, needs palliation, aggressive care is medically ineffective and futile  [name] [name], MD, FACP Pgr: [phone] [facility] 11/21/2016 3:16 PM

## 2016-11-22 ENCOUNTER — Inpatient Hospital Stay (HOSPITAL_COMMUNITY): Payer: Medicaid Other

## 2016-11-22 LAB — GLUCOSE, CAPILLARY
GLUCOSE-CAPILLARY: 28 mg/dL — AB (ref 65–99)
GLUCOSE-CAPILLARY: 93 mg/dL (ref 65–99)
GLUCOSE-CAPILLARY: 95 mg/dL (ref 65–99)
Glucose-Capillary: 102 mg/dL — ABNORMAL HIGH (ref 65–99)
Glucose-Capillary: 112 mg/dL — ABNORMAL HIGH (ref 65–99)
Glucose-Capillary: 115 mg/dL — ABNORMAL HIGH (ref 65–99)
Glucose-Capillary: 97 mg/dL (ref 65–99)

## 2016-11-22 LAB — PROTIME-INR
INR: 1.03
Prothrombin Time: 13.5 seconds (ref 11.4–15.2)

## 2016-11-22 LAB — APTT: APTT: 48 s — AB (ref 24–36)

## 2016-11-22 MED ORDER — ACETAMINOPHEN 325 MG PO TABS
650.0000 mg | ORAL_TABLET | Freq: Four times a day (QID) | ORAL | Status: DC | PRN
  Administered 2016-11-22: 650 mg via ORAL
  Filled 2016-11-22: qty 2

## 2016-11-22 MED ORDER — ALTEPLASE 2 MG IJ SOLR
2.0000 mg | Freq: Once | INTRAMUSCULAR | Status: AC
  Administered 2016-11-22: 2 mg
  Filled 2016-11-22: qty 2

## 2016-11-22 MED ORDER — FENTANYL CITRATE (PF) 100 MCG/2ML IJ SOLN
25.0000 ug | INTRAMUSCULAR | Status: DC | PRN
  Administered 2016-11-22 – 2016-12-01 (×15): 50 ug via INTRAVENOUS
  Administered 2016-12-07: 25 ug via INTRAVENOUS
  Administered 2016-12-08 – 2016-12-14 (×2): 50 ug via INTRAVENOUS
  Filled 2016-11-22 (×20): qty 2

## 2016-11-22 NOTE — Progress Notes (Signed)
SLP Note  Patient Details Name: Dale [name] MRN: [id number] DOB: 02/13/1966       Reason Eval/Treat Not Completed: Patient not medically ready.  Following chart per trach team.  Pt not appropriate for SLP intervention at this time.  Will follow along.    Blenda MountsCouture, Kaleb Linquist [name] 11/22/2016, 10:17 AM

## 2016-11-22 NOTE — Progress Notes (Signed)
PULMONARY / CRITICAL CARE MEDICINE   Name: Dale [name] MRN: [id number] DOB: 1966/02/09    ADMISSION DATE:  11-16-16  CHIEF COMPLAINT:  Nausea and Vomiting  BRIEF SUMMARY: 85 yom w/ severe neurological deficits after meningitis.  He was brought to the ED with decreased level of consciousness and nausea/vomiting/diarrhea. A CXR showed consolidation of the left upper lobe. He was intubated and transferred to [facility].   SUBJECTIVE: RT reports no weaning on vent.  No acute events overnight.    VITAL SIGNS: BP 127/84   Pulse (!) 115   Temp 98.7 F (37.1 C) (Axillary)   Resp 19   Ht 5\' 8"  (1.727 m)   Wt 154 lb (69.9 kg)   SpO2 99%   BMI 23.42 kg/m    VENTILATOR SETTINGS: Vent Mode: PRVC FiO2 (%):  [40 %] 40 % Set Rate:  [20 bmp] 20 bmp Vt Set:  [420 mL] 420 mL PEEP:  [5 cmH20] 5 cmH20 Plateau Pressure:  [12 cmH20-22 cmH20] 22 cmH20  INTAKE / OUTPUT: I/O last 3 completed shifts: In: 4422.9 [I.V.:617.9; NG/GT:3805] Out: 1250 [Urine:1250]  PHYSICAL EXAMINATION: General: Middle aged male, chronically ill appearing, not interactive  Neuro: eyes closed.  CN grossly intact. (-) lateralizing signs. HEENT: [place]/AT. PERRL, sclerae anicteric. Cardiovascular: RRR, no M/R/G.  Lungs: resps even, non-labored on vent, scattered rhonchi, decreased on left  Abdomen: BS x 4, soft, NT/ND.  Musculoskeletal: No gross deformities, no edema.  Skin: Intact, warm, no rashes.  LABS:  BMET  Recent Labs Lab 11/18/16 0500 11/20/16 0453 11/21/16 0442  NA 140 143 144  K 4.4 3.6 3.3*  CL 101 100* 101  CO2 29 30 31   BUN 12 12 12   CREATININE 0.61 0.74 0.75  GLUCOSE 97 93 113*   Electrolytes  Recent Labs Lab 11/17/16 0441 11/18/16 0500 11/20/16 0453 11/21/16 0442  CALCIUM 8.4* 8.8* 8.7* 8.6*  MG 2.1  --  1.9  --     CBC  Recent Labs Lab 11/17/16 0441 11/20/16 0453  WBC 12.0* 12.6*  HGB 9.0* 8.4*  HCT 28.5* 26.9*  PLT 383 395    Coag's No results for input(s): APTT, INR  in the last 168 hours.  Sepsis Markers No results for input(s): LATICACIDVEN, PROCALCITON, O2SATVEN in the last 168 hours.  ABG  Recent Labs Lab 11/21/16 1213  PHART 7.452*  PCO2ART 48.1*  PO2ART 66.6*    Liver Enzymes  Recent Labs Lab 11/18/16 0500 11/20/16 0453  AST 17 15  ALT 19 19  ALKPHOS 55 50  BILITOT <0.1* 0.6  ALBUMIN 1.6* 1.5*    Cardiac Enzymes No results for input(s): TROPONINI, PROBNP in the last 168 hours.  Glucose  Recent Labs Lab 11/21/16 1312 11/21/16 1636 11/21/16 2001 11/21/16 2003 11/22/16 0008 11/22/16 0455  GLUCAP 121* 94 28* 101* 102* 112*    Imaging Ct Chest Wo Contrast  Result Date: 11/22/2016 CLINICAL DATA:  Left chest opacification EXAM: CT CHEST WITHOUT CONTRAST TECHNIQUE: Multidetector CT imaging of the chest was performed following the standard protocol without IV contrast. COMPARISON:  Multiple chest radiographs most recently yesterday. FINDINGS: Cardiovascular: Heart size is normal. Very small amount of pericardial fluid. No aortic or coronary calcification identified. Mediastinum/Nodes: Mediastinal shift towards the right. No pathologically enlarged lymph nodes identified. Lungs/Pleura: Complete collapse of left lung. Large pleural effusion surrounding the left lung. Mass effect with mediastinal shift towards the right. Moderate-sized dependent effusion on the right with dependent pulmonary atelectasis. Nondependent lung is largely clear. Abnormal density  in the right upper lobe suggests developing bronchopneumonia. Focal areas of irregular nodular shadows within the right lung are nonspecific and could represent foci of atelectasis or pneumonia. Neoplastic mass lesions seem unlikely given the generally clear previous chest radiography on the right. Upper Abdomen: Negative Musculoskeletal: Normal IMPRESSION: Very large left effusion. Complete collapse of left lung. Mass effect with left-to-right mediastinal shift. Moderate size right  effusion layering dependently. Dependent pulmonary atelectasis. Possible developing pneumonia right upper lobe. Areas of patchy pulmonary density in the right lung could represent atelectasis or nodular infiltrates. Mass lesions seem less likely, but follow-up should be performed. Electronically Signed   By: Paulina FusiMark  Shogry M.D.   On: 11/22/2016 07:18   Dg Chest Portable 1 View  Result Date: 11/21/2016 CLINICAL DATA:  Pleural effusion.  Tracheostomy tube . EXAM: PORTABLE CHEST 1 VIEW COMPARISON:  11/20/2016. FINDINGS: Tracheostomy tube noted in stable position. Right PICC line noted in stable position. Complete opacification of the left hemithorax again noted. Shift of the mediastinum heart the right noted. These findings again suggest a left pleural effusion. Underlying left lung consolidation cannot be excluded. IMPRESSION: 1. Tracheostomy tube in stable position. 2. Unchanged chest with complete opacification of the left hemithorax. Findings consistent with left pleural effusion. Underlying left lung consolidation cannot be excluded . Electronically Signed   By: Maisie Fushomas  Register   On: 11/21/2016 10:24     STUDIES:  CXR 11/26 >> LUL PNA EEG 12/5 >> negative for seizure   CULTURES: Blood 11/26 >> coag neg staph 1/2  C. Diff 11/26 >> (-) Resp 11/27 >>  Enterobacter cloacae BAL 12/14 >> proteus (RES cipro otherwise sens)   ANTIBIOTICS: Vanc 11/26 >>11/30 Pip-tazo 11/26 >>12/1 Cipro 12/1 >> 12/8 Ceftriaxone 12/15 >>   SIGNIFICANT EVENTS: 11/26 - Intubated (endotracheally) at [facility] ED   11/29 - bronch with left inspissated pus 12/14 - proteus from BAL 12/18 - Worsening resp status, increased WOB.  S/p FOB 12/14, CXR 12/17 still c complete opacification L hemithorax.    LINES/TUBES: PEG PICC 11/27  RUE >> Foley [name] 12/1 >>  DISCUSSION: 50 y/o man s/p devastating meningitis, chronic debility, p/w fever, AMS & severe L HCAP vs aspiration. This has been treated.  He is trach/PEG  dependent. Mucous plugging has been most recent issue.This will likely be a chronic and recurrent issue. We did obtain a BAL on 12/14 during his FOB which grew proteus. Despite FOB, he has had continued left chest opacification worrisome for effusion.    ASSESSMENT / PLAN:   Acute resp failure/ ARDS/ L HCAP with Enterobacter cloacae - s/p abx.  Worsening resp status 12/18.  R/O Left Pleural Effusion  Left lung atelectasis/collapse d/t mucous plugging  Hx of trach x2. Trached for the 3rd time on 12/1 P:   Continue PRVC - 8 cc/kg  F/u CXR Likely will need thoracentesis on left, PCCM will review for thoracentesis post Palliative Care meeting for goals NO Decannulation (ever) Cont neb meds.  Ceftriaxone as above for proteus, D5/x abx Continue chest PT. Trend fever and WBC curve Eventually will need to change back to cuffless trach as can't go to facility w/ cuffed trach   Other medical issues per [facility]   FAMILY  - Updates: Needs trach v vent SNF   Canary BrimBrandi Ollis, NP-C [facility] Pgr: [phone] or if no answer [phone] 11/22/2016, 8:32 AM   STAFF NOTE: I, Rory Percyaniel Yazmyn Valbuena, MD FACP have personally reviewed patient's available data, including medical history, events of  note, physical examination and test results as part of my evaluation. I have discussed with resident/NP and other care providers such as pharmacist, RN and RRT. In addition, I personally evaluated patient and elicited key findings of: no change in poor baseline Neurostatus, reduced BS left, CT shows large effusion on left with compressive left lung, if we r to be aggressive I will place a chest tube NOT thoracentesis for this degree of effusion and likely hood of reoccurence and possible etiologies with associated PNA, WOULD PREFER PALLIATIVE CARE CONSULT to take place before chest tubeplacement consideration, we should push for comfort care and not intervene any further in this poor patient with NO  quality of life, if family insists will place hcest tube in afternoon wed, will call primary attending to discuss plans, pall care consult is in since 12/18, neg balance lasix, abx   [name] [name], MD, FACP Pgr: [phone] [place] [facility] 11/22/2016 5:55 PM

## 2016-11-22 NOTE — Progress Notes (Signed)
PROGRESS NOTE    Dale [name]  QBH:[id number] DOB: 1966-05-03 DOA: 10/29/2016 PCP: No PCP Per Patient   Brief Narrative: 50 y.o. male past medical history severe neurological deficits after meningitis, was brought to the ED due to decreased level of consciousness with nausea vomiting and diarrhea, chest x-ray showed consolidation of the left upper lobe, was intubated.  Assessment & Plan:  Significant Events: 11/26 Intubated (endotracheally) at [facility] ED - transferred to [facility]  11/29 bronch - left inspissated pus 12/14 - proteus from BAL 12/18 - Worsening resp status, increased WOB.  S/p FOB 12/14, CXR 12/17 still c complete opacification L hemithorax.    #Acute respiratory failure with hypoxia secondary to extensive left lung Enterobacter pneumonia leading to ARDS and sepsis: -Status post tracheostomy for third time, further care as per pulmonary critical care. Repeat chest x-ray from yesterday with complaint opacification of left hemithorax. Plan for CT chest as per pulmonologist. Also planning for possible left thoracocentesis as per pulmonary note. Continue IV Rocephin. Continue nebulization and bronchodilators. Pulmonary consult appreciated.  -Palliative care consult at yesterday for ulcer care. Patient is not clinically improving.  #Paroxysmal atrial fibrillation: Still tachycardic. Currently on IV metoprolol and amiodarone. Monitor heart rate and blood pressure closely.  #Staph coagulase -1 or 2 blood cultures: Due to contaminated.  -The patient was febrile this morning. Repeated 2 sets of blood culture. Tylenol ordered when necessary. Repeat labs in the morning  # Acute on chronic encephalopathy: -Not improving mental status. Continue to monitor. History of severe meningitis in the past.  # Chronic systolic heart failure: Euvolemic on exam. Continue supportive care.  #Adrenal insufficiency (Addison's disease) ([place]): Continue Florinef and hydrocortisone.  #  Hypokalemia/hypomagnesemia: Repeat lab in the morning.  #Severe protein caloric malnutrition: Dietary referral. Currently on tube feeding. Continue tube feedings.  Active Problems:   Adrenal insufficiency (Addison's disease) ([place])   Respiratory failure (HCC)   Renal failure   Pressure injury of skin   Acute respiratory failure with hypoxia (HCC)   ARDS (adult respiratory distress syndrome) (HCC)   Severe sepsis (HCC)   encephalopathy (HCC)   Systolic and diastolic CHF, chronic (HCC)   Mucus plugging of bronchi   Abdominal distension   Collapse of left lung   DVT prophylaxis: Heparin subcutaneous Code Status: Full code Family Communication: No family present at bedside. Disposition Plan: Likely discharge to SNF in 2-3 days depending on clinical improvement. Palliative care Evaluation pending.   Consultants:   Critical care  Procedures: PEG PICC 11/27  RUE >> Foley Trache 12/1 >>  Antimicrobials: Vanc 11/26 >>11/30 Pip-tazo 11/26 >>12/1 Cipro 12/1 >> 12/8 Ceftriaxone 12/15 >>  Subjective: Patient was seen and examined at bedside. Patient was minimally responsive to painful stimuli. Unable to obtain review of system because of encephalopathy.   Objective: Vitals:   11/22/16 0846 11/22/16 0900 11/22/16 1201 11/22/16 1225  BP:  95/66  124/83  Pulse:  (!) 104    Resp:  18    Temp:    (!) 101.3 F (38.5 C)  TempSrc:    Oral  SpO2: 95% 97% 90%   Weight:      Height:        Intake/Output Summary (Last 24 hours) at 11/22/16 1236 Last data filed at 11/22/16 1132  Gross per 24 hour  Intake           2823.2 ml  Output              225 ml  Net  2598.2 ml   Filed Weights   11/20/16 0428 11/21/16 0000 11/22/16 0600  Weight: 64.9 kg (143 lb) 65.3 kg (144 lb) 69.9 kg (154 lb)    Examination:  General exam: Ill-looking male lying comfortably, not in distress.  Neck: Tracheostomy site looks clean, no discharge Respiratory system: Coarse breath sound  bilateral, no wheezing Cardiovascular system: S1 & S2 heard, RRR.  No pedal edema. Gastrointestinal system: Abdomen soft, nondistended. Bowel sound positive Central nervous system: Minimally responsive to painful stimuli Skin: No rashes, lesions or ulcers Psychiatry: Unable to assess because of encephalopathy..     Data Reviewed: I have personally reviewed following labs and imaging studies  CBC:  Recent Labs Lab 11/17/16 0441 11/20/16 0453  WBC 12.0* 12.6*  HGB 9.0* 8.4*  HCT 28.5* 26.9*  MCV 93.8 91.5  PLT 383 992   Basic Metabolic Panel:  Recent Labs Lab 11/17/16 0441 11/18/16 0500 11/20/16 0453 11/21/16 0442  NA 139 140 143 144  K 3.3* 4.4 3.6 3.3*  CL 98* 101 100* 101  CO2 _0 GLUCOSE 94 97 93 113*  BUN _1 CREATININE 0.63 0.61 0.74 0.75  CALCIUM 8.4* 8.8* 8.7* 8.6*  MG 2.1  --  1.9  --    GFR: Estimated Creatinine Clearance: 104.5 mL/min (by C-G formula based on SCr of 0.75 mg/dL). Liver Function Tests:  Recent Labs Lab 11/18/16 0500 11/20/16 0453  AST 17 15  ALT 19 19  ALKPHOS 55 50  BILITOT <0.1* 0.6  PROT 6.0* 6.4*  ALBUMIN 1.6* 1.5*   No results for input(s): LIPASE, AMYLASE in the last 168 hours. No results for input(s): AMMONIA in the last 168 hours. Coagulation Profile: No results for input(s): INR, PROTIME in the last 168 hours. Cardiac Enzymes: No results for input(s): CKTOTAL, CKMB, CKMBINDEX, TROPONINI in the last 168 hours. BNP (last 3 results) No results for input(s): PROBNP in the last 8760 hours. HbA1C: No results for input(s): HGBA1C in the last 72 hours. CBG:  Recent Labs Lab 11/21/16 2003 11/22/16 0008 11/22/16 0455 11/22/16 0835 11/22/16 1223  GLUCAP 101* 102* 112* 93 115*   Lipid Profile: No results for input(s): CHOL, HDL, LDLCALC, TRIG, CHOLHDL, LDLDIRECT in the last 72 hours. Thyroid Function Tests:  Recent Labs  11/20/16 0453  TSH 1.517   Anemia Panel: No results for input(s):  VITAMINB12, FOLATE, FERRITIN, TIBC, IRON, RETICCTPCT in the last 72 hours. Sepsis Labs: No results for input(s): PROCALCITON, LATICACIDVEN in the last 168 hours.  Recent Results (from the past 240 hour(s))  Culture, bal-quantitative     Status: Abnormal   Collection Time: 11/17/16  1:51 PM  Result Value Ref Range Status   Specimen Description BRONCHIAL ALVEOLAR LAVAGE  Final   Special Requests NONE  Final   Gram Stain   Final    FEW WBC PRESENT, PREDOMINANTLY PMN FEW GRAM POSITIVE RODS RARE GRAM POSITIVE COCCI IN CLUSTERS    Culture (A)  Final    60,000 COLONIES/mL PROTEUS MIRABILIS 30,000 COLONIES/mL GROUP B STREP(S.AGALACTIAE)ISOLATED TESTING AGAINST S. AGALACTIAE NOT ROUTINELY PERFORMED DUE TO PREDICTABILITY OF AMP/PEN/VAN SUSCEPTIBILITY.    Report Status 11/19/2016 FINAL  Final   Organism ID, Bacteria PROTEUS MIRABILIS (A)  Final      Susceptibility   Proteus mirabilis - MIC*    AMPICILLIN <=2 SENSITIVE Sensitive     CEFAZOLIN <=4 SENSITIVE Sensitive     CEFEPIME <=1 SENSITIVE Sensitive     CEFTAZIDIME <=1 SENSITIVE Sensitive  CEFTRIAXONE <=1 SENSITIVE Sensitive     CIPROFLOXACIN >=4 RESISTANT Resistant     GENTAMICIN <=1 SENSITIVE Sensitive     IMIPENEM 4 SENSITIVE Sensitive     TRIMETH/SULFA <=20 SENSITIVE Sensitive     AMPICILLIN/SULBACTAM <=2 SENSITIVE Sensitive     PIP/TAZO <=4 SENSITIVE Sensitive     * 60,000 COLONIES/mL PROTEUS MIRABILIS         Radiology Studies: Ct Chest Wo Contrast  Result Date: 11/22/2016 CLINICAL DATA:  Left chest opacification EXAM: CT CHEST WITHOUT CONTRAST TECHNIQUE: Multidetector CT imaging of the chest was performed following the standard protocol without IV contrast. COMPARISON:  Multiple chest radiographs most recently yesterday. FINDINGS: Cardiovascular: Heart size is normal. Very small amount of pericardial fluid. No aortic or coronary calcification identified. Mediastinum/Nodes: Mediastinal shift towards the right. No  pathologically enlarged lymph nodes identified. Lungs/Pleura: Complete collapse of left lung. Large pleural effusion surrounding the left lung. Mass effect with mediastinal shift towards the right. Moderate-sized dependent effusion on the right with dependent pulmonary atelectasis. Nondependent lung is largely clear. Abnormal density in the right upper lobe suggests developing bronchopneumonia. Focal areas of irregular nodular shadows within the right lung are nonspecific and could represent foci of atelectasis or pneumonia. Neoplastic mass lesions seem unlikely given the generally clear previous chest radiography on the right. Upper Abdomen: Negative Musculoskeletal: Normal IMPRESSION: Very large left effusion. Complete collapse of left lung. Mass effect with left-to-right mediastinal shift. Moderate size right effusion layering dependently. Dependent pulmonary atelectasis. Possible developing pneumonia right upper lobe. Areas of patchy pulmonary density in the right lung could represent atelectasis or nodular infiltrates. Mass lesions seem less likely, but follow-up should be performed. Electronically Signed   By: Nelson Chimes M.D.   On: 11/22/2016 07:18   Dg Chest Portable 1 View  Result Date: 11/21/2016 CLINICAL DATA:  Pleural effusion.  Tracheostomy tube . EXAM: PORTABLE CHEST 1 VIEW COMPARISON:  11/20/2016. FINDINGS: Tracheostomy tube noted in stable position. Right PICC line noted in stable position. Complete opacification of the left hemithorax again noted. Shift of the mediastinum heart the right noted. These findings again suggest a left pleural effusion. Underlying left lung consolidation cannot be excluded. IMPRESSION: 1. Tracheostomy tube in stable position. 2. Unchanged chest with complete opacification of the left hemithorax. Findings consistent with left pleural effusion. Underlying left lung consolidation cannot be excluded . Electronically Signed   By: [place]   On: 11/21/2016 10:24          Scheduled Meds: . atorvastatin  20 mg Per Tube q1800  . budesonide (PULMICORT) nebulizer solution  0.5 mg Nebulization BID  . cefTRIAXone (ROCEPHIN)  IV  1 g Intravenous Q24H  . chlorhexidine gluconate (MEDLINE KIT)  15 mL Mouth Rinse BID  . clonazePAM  0.25 mg Per Tube Q6H  . famotidine  20 mg Per Tube BID  . fludrocortisone  0.1 mg Per Tube Daily  . folic acid  1 mg Per Tube Daily  . free water  200 mL Per Tube Q4H  . heparin  5,000 Units Subcutaneous Q8H  . hydrocortisone  10 mg Per Tube QHS  . hydrocortisone  5 mg Per Tube Daily  . ipratropium  0.5 mg Nebulization QID  . levalbuterol  0.63 mg Nebulization QID  . levETIRAcetam  1,500 mg Per Tube BID  . mouth rinse  15 mL Mouth Rinse QID  . metoprolol  10 mg Intravenous Q6H  . QUEtiapine  50 mg Per Tube BID  . thiamine  100 mg Per Tube Daily  . Valproate Sodium  750 mg Per Tube Q8H   Continuous Infusions: . amiodarone 30 mg/hr (11/22/16 0153)  . feeding supplement (VITAL AF 1.2 CAL) 1,000 mL (11/22/16 0644)     LOS: 23 days    Dron Tanna Furry, MD [facility] Pager [phone]  If 7PM-7AM, please contact night-coverage [email or web address] Password [facility] 11/22/2016, 12:36 PM

## 2016-11-22 NOTE — Progress Notes (Signed)
Obtained phone consent for Left thoracentesis from Mickeal SkinnerShirley Schellhorn another RN as witness

## 2016-11-23 ENCOUNTER — Inpatient Hospital Stay (HOSPITAL_COMMUNITY): Payer: Medicaid Other

## 2016-11-23 DIAGNOSIS — R6520 Severe sepsis without septic shock: Secondary | ICD-10-CM

## 2016-11-23 DIAGNOSIS — J90 Pleural effusion, not elsewhere classified: Secondary | ICD-10-CM

## 2016-11-23 LAB — CBC
HCT: 24.9 % — ABNORMAL LOW (ref 39.0–52.0)
Hemoglobin: 7.8 g/dL — ABNORMAL LOW (ref 13.0–17.0)
MCH: 29.7 pg (ref 26.0–34.0)
MCHC: 31.3 g/dL (ref 30.0–36.0)
MCV: 94.7 fL (ref 78.0–100.0)
Platelets: 289 10*3/uL (ref 150–400)
RBC: 2.63 MIL/uL — AB (ref 4.22–5.81)
RDW: 18.3 % — AB (ref 11.5–15.5)
WBC: 13.2 10*3/uL — AB (ref 4.0–10.5)

## 2016-11-23 LAB — LACTATE DEHYDROGENASE, PLEURAL OR PERITONEAL FLUID: LD, Fluid: 1372 U/L — ABNORMAL HIGH (ref 3–23)

## 2016-11-23 LAB — BASIC METABOLIC PANEL
ANION GAP: 10 (ref 5–15)
BUN: 12 mg/dL (ref 6–20)
CALCIUM: 8.7 mg/dL — AB (ref 8.9–10.3)
CO2: 31 mmol/L (ref 22–32)
Chloride: 104 mmol/L (ref 101–111)
Creatinine, Ser: 0.73 mg/dL (ref 0.61–1.24)
GLUCOSE: 107 mg/dL — AB (ref 65–99)
POTASSIUM: 3.2 mmol/L — AB (ref 3.5–5.1)
SODIUM: 145 mmol/L (ref 135–145)

## 2016-11-23 LAB — GLUCOSE, CAPILLARY
GLUCOSE-CAPILLARY: 107 mg/dL — AB (ref 65–99)
GLUCOSE-CAPILLARY: 90 mg/dL (ref 65–99)
GLUCOSE-CAPILLARY: 98 mg/dL (ref 65–99)
Glucose-Capillary: 102 mg/dL — ABNORMAL HIGH (ref 65–99)
Glucose-Capillary: 106 mg/dL — ABNORMAL HIGH (ref 65–99)
Glucose-Capillary: 114 mg/dL — ABNORMAL HIGH (ref 65–99)

## 2016-11-23 LAB — BODY FLUID CELL COUNT WITH DIFFERENTIAL
Eos, Fluid: 0 %
Lymphs, Fluid: 63 %
Monocyte-Macrophage-Serous Fluid: 9 % — ABNORMAL LOW (ref 50–90)
NEUTROPHIL FLUID: 28 % — AB (ref 0–25)
Total Nucleated Cell Count, Fluid: 72 cu mm (ref 0–1000)

## 2016-11-23 LAB — APTT: aPTT: 48 seconds — ABNORMAL HIGH (ref 24–36)

## 2016-11-23 LAB — PROTIME-INR
INR: 1.06
PROTHROMBIN TIME: 13.9 s (ref 11.4–15.2)

## 2016-11-23 LAB — PROTEIN, BODY FLUID: Total protein, fluid: 3.8 g/dL

## 2016-11-23 MED ORDER — ETOMIDATE 2 MG/ML IV SOLN
20.0000 mg | Freq: Once | INTRAVENOUS | Status: AC
  Administered 2016-11-23: 20 mg via INTRAVENOUS

## 2016-11-23 MED ORDER — FENTANYL CITRATE (PF) 100 MCG/2ML IJ SOLN
INTRAMUSCULAR | Status: AC
  Administered 2016-11-23: 100 ug
  Filled 2016-11-23: qty 2

## 2016-11-23 MED ORDER — MIDAZOLAM HCL 2 MG/2ML IJ SOLN
INTRAMUSCULAR | Status: AC
  Administered 2016-11-23: 2 mg
  Filled 2016-11-23: qty 4

## 2016-11-23 MED ORDER — SODIUM CHLORIDE 0.9 % IV BOLUS (SEPSIS)
500.0000 mL | Freq: Once | INTRAVENOUS | Status: AC
  Administered 2016-11-23: 500 mL via INTRAVENOUS

## 2016-11-23 MED ORDER — HALOPERIDOL LACTATE 5 MG/ML IJ SOLN
2.0000 mg | Freq: Once | INTRAMUSCULAR | Status: AC
  Administered 2016-11-23: 2 mg via INTRAVENOUS
  Filled 2016-11-23: qty 1

## 2016-11-23 MED ORDER — LIDOCAINE HCL (PF) 1 % IJ SOLN
INTRAMUSCULAR | Status: AC
  Administered 2016-11-23: 5 mL
  Filled 2016-11-23: qty 5

## 2016-11-23 MED ORDER — POTASSIUM CHLORIDE 20 MEQ PO PACK
40.0000 meq | PACK | Freq: Once | ORAL | Status: AC
Start: 2016-11-23 — End: 2016-11-23
  Administered 2016-11-23: 40 meq
  Filled 2016-11-23: qty 2

## 2016-11-23 NOTE — Progress Notes (Signed)
Nutrition Follow-up  DOCUMENTATION CODES:   Severe malnutrition in context of chronic illness  INTERVENTION:    Continue Vital AF 1.2 via PEG at 65 ml/h to provide 1872 kcals, 117 gm protein, 1265 ml free water daily.  Continue free water flushes 200 ml every 4 hours.  NUTRITION DIAGNOSIS:   Malnutrition (Severe) related to chronic illness as evidenced by severe depletion of body fat, severe depletion of muscle mass  Ongoing  GOAL:   Patient will meet greater than or equal to 90% of their needs  Met  MONITOR:   TF tolerance, I & O's, Vent status, Labs  ASSESSMENT:   Pt with strep pneumo meningitis c/b cardiac arrest and cardiogenic shock, ARDS requiring prolonged intubation and trach (now decannulated), and acute renal failure.  He was admitted to [facility] 06/16/16 through 07/11/16 for recurrent hypoglycemia due to adrenal insufficiency, new subacute CVA, seizures with inability to protect airway s/p intubation 7/13 through 7/21.  He re-presented 8/08 again with sepsis from UTI, and was intubated due to AMS, and was trached 8/18, and was discharged with a cuffless trach. That hospitalization was c/b MRSA bacteremia 8/24, and completed 3 weeks of IV vanc. He also developed C. Diff, and was treated with PO vanc through 9/11. He has had continued autonomic dysreflexia, which worsens during complications of acute illnesses. Admitted with N/V/D.   Pt transferred from [facility] to [facility] 12/8. S/p devastating meningitis, chronic debility and severe HCAP vs aspiration.  Patient is currently on ventilator support >> trach  MV: 9.0 L/min Temp (24hrs), Avg:98.1 F (36.7 C), Min:96.7 F (35.9 C), Max:99.6 F (37.6 C)  S/p bronchoscopy 12/14. Receiving Vital AF 1.2 via PEG at 65 ml/h to provide 1872 kcals, 117 gm protein, 1265 ml free water daily.  Free water flushes 200 ml every 4 hours. Weight fluctuating but stable. CBG's 107-90-106.  Diet Order:  Diet NPO time  specified  Skin:  Wound (see comment) (unstageable right heel)  Last BM:  12/20  Height:   Ht Readings from Last 1 Encounters:  10/31/16 '5\' 8"'$  (1.727 m)    Weight:  Wt Readings from Last 1 Encounters:  11/23/16 160 lb (72.6 kg)    12/19  154 lb 12/18  144 lb 12/17  143 lb 12/16  155 lb 12/15  153 lb 12/14  154 lb 12/13  153 lb 12/12  150 lb 12/11  149 lb 12/10  156 lb 12/09  164 lb 12/08  167 lb 12/07  168 lb 12/06  167 lb  Ideal Body Weight:  70 kg  BMI:  Body mass index is 24.33 kg/m.  Estimated Nutritional Needs:   Kcal:  1800-2000  Protein:  110-120 gm  Fluid:  1.8-2.0 L  EDUCATION NEEDS:   No education needs identified at this time  Arthur Holms, RD, LDN Pager #: [phone] After-Hours Pager #: [phone]

## 2016-11-23 NOTE — Progress Notes (Addendum)
PULMONARY / CRITICAL CARE MEDICINE   Name: Dale [name] MRN: [id number] DOB: 06/06/1966    ADMISSION DATE:  Aug 01, 2016  CHIEF COMPLAINT:  Nausea and Vomiting  BRIEF SUMMARY: [age] yom w/ severe neurological deficits after meningitis.  He was brought to the ED with decreased level of consciousness and nausea/vomiting/diarrhea. A CXR showed consolidation of the left upper lobe. [facility] was intubated and [facility]   SUBJECTIVE: 50% O2, peep 5, same MV   VITAL SIGNS: BP (!) 136/96 (BP Location: Left Arm)   Pulse 73   Temp 97.6 F (36.4 C) (Oral)   Resp (!) 21   Ht 5\' 8"  (1.727 m)   Wt 72.6 kg (160 lb)   SpO2 98%   BMI 24.33 kg/m    VENTILATOR SETTINGS: Vent Mode: PRVC FiO2 (%):  [40 %] 40 % Set Rate:  [20 bmp] 20 bmp Vt Set:  [420 mL] 420 mL PEEP:  [5 cmH20] 5 cmH20 Plateau Pressure:  [18 cmH20-25 cmH20] 22 cmH20  INTAKE / OUTPUT: I/O last 3 completed shifts: In: 3879.5 [I.V.:604.5; NG/GT:3275] Out: 1850 [Urine:1850]  PHYSICAL EXAMINATION: General: Middle aged male, chronically ill appearing, not interactive, int agitation Neuro: eyes closed.  CN grosly intact. (-) lateralizing signs.s HEENT: perrl Cardiovascular: RRR, no M/R/G.  Lungs: reduced BS left Abdomen: BS x 4, soft, NT/ND.  Musculoskeletal: No gross deformities, no edema.  Skin: Intact, warm, no rashes.  LABS:  BMET  Recent Labs Lab 11/20/16 0453 11/21/16 0442 11/23/16 0500  NA 143 144 145  K 3.6 3.3* 3.2*  CL 100* 101 104  CO2 30 31 31   BUN 12 12 12   CREATININE 0.74 0.75 0.73  GLUCOSE 93 113* 107*   Electrolytes  Recent Labs Lab 11/17/16 0441  11/20/16 0453 11/21/16 0442 11/23/16 0500  CALCIUM 8.4*  < > 8.7* 8.6* 8.7*  MG 2.1  --  1.9  --   --   < > = values in this interval not displayed.  CBC  Recent Labs Lab 11/17/16 0441 11/20/16 0453 11/23/16 0500  WBC 12.0* 12.6* 13.2*  HGB 9.0* 8.4* 7.8*  HCT 28.5* 26.9* 24.9*  PLT 383 395 289    Coag's  Recent Labs Lab  11/22/16 1236 11/22/16 2040  APTT 48* 48*  INR 1.03 1.06    Sepsis Markers No results for input(s): LATICACIDVEN, PROCALCITON, O2SATVEN in the last 168 hours.  ABG  Recent Labs Lab 11/21/16 1213  PHART 7.452*  PCO2ART 48.1*  PO2ART 66.6*    Liver Enzymes  Recent Labs Lab 11/18/16 0500 11/20/16 0453  AST 17 15  ALT 19 19  ALKPHOS 55 50  BILITOT <0.1* 0.6  ALBUMIN 1.6* 1.5*    Cardiac Enzymes No results for input(s): TROPONINI, PROBNP in the last 168 hours.  Glucose  Recent Labs Lab 11/22/16 1658 11/22/16 1939 11/23/16 0021 11/23/16 0414 11/23/16 0852 11/23/16 1157  GLUCAP 95 97 114* 107* 90 106*    Imaging No results found.   STUDIES:  CXR 11/26 >> LUL PNA EEG 12/5 >> negative for seizure   CULTURES: Blood 11/26 >> coag neg staph 1/2  C. Diff 11/26 >> (-) Resp 11/27 >>  Enterobacter cloacae BAL 12/14 >> proteus (RES cipro otherwise sens)   ANTIBIOTICS: Vanc 11/26 >>11/30 Pip-tazo 11/26 >>12/1 Cipro 12/1 >> 12/8 Ceftriaxone 12/15 >>   SIGNIFICANT EVENTS: 11/26 - Intubated (endotracheally) at [facility] ED   11/29 - bronch with left inspissated pus 12/14 - proteus from BAL 12/18 - Worsening resp status,  increased WOB.  S/p FOB 12/14, CXR 12/17 still c complete opacification L hemithorax.    LINES/TUBES: PEG PICC 11/27  RUE >> Foley Dale Nickelrache 12/1 >>  DISCUSSION: 50 y/o man s/p devastating meningitis, chronic debility, p/w fever, AMS & severe L HCAP vs aspiration. This has been treated.  He is trach/PEG dependent. Mucous plugging has been most recent issue.This will likely be a chronic and recurrent issue. We did obtain a BAL on 12/14 during his FOB which grew proteus. Despite FOB, he has had continued left chest opacification worrisome for effusion.    ASSESSMENT / PLAN:   Acute resp failure/ ARDS/ L HCAP with Enterobacter cloacae - s/p abx.  Worsening resp status 12/18.  R/O Left Pleural Effusion, massive Left lung atelectasis/collapse d/t  mucous plugging  Hx of trach x2. Trached for the 3rd time on 12/1 P:   D/w Palliative care Dr Phillips OdorGolding, we both agree that wife remains reluctant now for palliation and comfort care talks and  Placement of chest tube would likely facilitate liberation back off vent and hence more comfort for this pt coags wnl, plat wnl, consent done, chest tube to place May need to stagger output to avoid re expansion edema Lasix to neg balance k supp Post op pcxr  To suction as goal Will ensure this is NOT empyema Also with inflammatory right side lesion, send cytology   Dale Rossettianiel J. Tyson [name], MD, FACP Pgr: [phone] [facility]

## 2016-11-23 NOTE — Progress Notes (Signed)
PROGRESS NOTE    Dale [name]  KKX:[id number] DOB: Apr 30, 1966 DOA: 10/31/2016 PCP: No PCP Per Patient   Brief Narrative:  50 y.o. BM with PMHx Cerebral septic emboli ; Cerebral thrombosis with cerebral infarction (03/25/2016),  Convulsions/seizures  (05/04/2016); Dementia (04/27/2016); Depression; Dysphagia (05/04/2016); ETOH abuse;Tobacco abuse , Hydrocephalus Acute encephalopathy;Meningitis, Acute ischemic stroke, Cardiac arrest; Cardiogenic shock, NSTEMI,  Chronic Diastolic and Systolic CHF    Admission in March 2017 with strep pneumo meningitis c/b cardiac arrest and cardiogenic shock, ARDS requiring prolonged intubation and trach (now decannulated), and acute renal failure. He was admitted to [facility] 06/16/16 through 07/11/16 for recurrent hypoglycemia due to adrenal insufficiency, new subacute CVA, seizures with inability to protect airway s/p intubation 7/13 through 7/21. He re-presented 8/08 again with sepsis from UTI, and was intubated due to AMS, and was trached 8/18, and was discharged with a cuffless trach. That hospitalization was c/b MRSA bacteremia 8/24, and completed 3 weeks of IV vanc. He also developed C. Diff, and was treated with PO vanc through 9/11. He has had continued autonomic dysreflexia, which worsens during complications of acute illnesses.  Prior to this presentation, he had been well, although he was decannulated for reasons that are unclear. He was brought to the ED with decreased level of consciousness and nausea/vomiting/diarrhea. A CXR showed consolidation of the left upper lobe. He was intubated and transferred to [facility].  There have been significant amount of conversations with his wife with regard to goals of care, but she has insisted on full support. He is full code.   Subjective: 12/21 A/O x 0. Eyes open unresponsive. Mild w/d to pain    Assessment & Plan:   Active Problems:   Adrenal insufficiency (Addison's disease) ([place])   Respiratory failure (HCC)   Renal  failure   Pressure injury of skin   Acute respiratory failure with hypoxia (HCC)   ARDS (adult respiratory distress syndrome) (HCC)   Severe sepsis (HCC)   Chronic anoxic encephalopathy (HCC)   Systolic and diastolic CHF, chronic (HCC)   Mucus plugging of bronchi   Abdominal distension   Collapse of left lung   Acute hypoxic respiratory failure secondary to Enterobacter HCAP w/ ARDS and Sepsis  Status post tracheostomy for the third time - trach care per PCCM with no plans to decannulate - sepsis and acute infection appear to have cleared - tolerating trach collar - SPO2 goal 93% -Left chest tube in place draining significant serosanguineous fluid.  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 -Mental status appears to be at his baseline   Transient Atrial fibrillation -currently NSR   Chronic Systolic and Diastolic CHF -EF 16% Aug [id number]  - no evidence of volume overload at this time  -Strict in and out since admission + 6.5 L Filed Weights   11/21/16 0000 11/22/16 0600 11/23/16 0500  Weight: 65.3 kg (144 lb) 69.9 kg (154 lb) 72.6 kg (160 lb)  - weight is Relatively stable  Adrenal insufficiency  - 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   Hypernatremia -Resolved with increase in free water via tube   Hypokalemia -Potassium goal > 4   Hypomagnesemia -Magnesium goal > 2    Severe malnutrition in context  of chronic illness -Continue tube feeds per PEG   Goals of care - Appears from notes that family is considering hospice. Will speak with palliative care on subject in the a.m.     DVT prophylaxis: Subcutaneous heparin Code Status: Full Family Communication:  None Disposition Plan: Ready for SNF in next 24-48 hours if he stays on 28% FiO2   Consultants:  [facility]  Procedures/Significant Events:  11/26 Intubated (endotracheally) at [facility] - transferred to [facility]  11/29 bronch - left inspissated pus   VENTILATOR SETTINGS: NA   Cultures   Antimicrobials: Vanc 11/26 > 11/29 Zosyn 11/26 > 12/1 Cipro 12/1 >12/8   Devices    LINES / TUBES:      Continuous Infusions: . amiodarone 30 mg/hr (11/23/16 1211)  . feeding supplement (VITAL AF 1.2 CAL) 1,000 mL (11/23/16 0147)     Objective: Vitals:   11/23/16 1000 11/23/16 1217 11/23/16 1234 11/23/16 1628  BP: 108/79 (!) 136/96    Pulse: 89 73    Resp: (!) 22 (!) 21    Temp:  97.6 F (36.4 C)    TempSrc:  Oral    SpO2: 95% 97% 98% 100%  Weight:      Height:        Intake/Output Summary (Last 24 hours) at 11/23/16 1750 Last data filed at 11/23/16 1554  Gross per 24 hour  Intake           1649.1 ml  Output             2450 ml  Net           -800.9 ml   Filed Weights   11/21/16 0000 11/22/16 0600 11/23/16 0500  Weight: 65.3 kg (144 lb) 69.9 kg (154 lb) 72.6 kg (160 lb)    Examination:  General: No acute respiratory distress  Lungs: Clear to auscultation bilaterally Cardiovascular: Tachycardia w/o gallup or rub   Abdomen: Nondistended, soft, bowel sounds positive Extremities: Stable trace edema B LE  .     Data Reviewed: Care during the described time interval was provided by me .  I have reviewed this patient's available data, including medical history, events of note, physical examination, and all test results as part of my evaluation. I have personally reviewed and interpreted all radiology studies.  CBC:  Recent Labs Lab 11/17/16 0441 11/20/16 0453 11/23/16 0500  WBC 12.0* 12.6* 13.2*  HGB 9.0* 8.4* 7.8*  HCT 28.5* 26.9* 24.9*  MCV 93.8 91.5 94.7  PLT 383 395 332   Basic Metabolic Panel:  Recent Labs Lab 11/17/16 0441 11/18/16 0500  11/20/16 0453 11/21/16 0442 11/23/16 0500  NA 139 140 143 144 145  K 3.3* 4.4 3.6 3.3* 3.2*  CL 98* 101 100* 101 104  CO2 '30 29 30 31 31  '$ GLUCOSE 94 97 93 113* 107*  BUN '11 12 12 12 12  '$ CREATININE 0.63 0.61 0.74 0.75 0.73  CALCIUM 8.4* 8.8* 8.7* 8.6* 8.7*  MG 2.1  --  1.9  --   --    GFR: Estimated Creatinine Clearance: 104.5 mL/min (by C-G formula based on SCr of 0.73 mg/dL). Liver Function Tests:  Recent Labs Lab 11/18/16 0500 11/20/16 0453  AST 17 15  ALT 19 19  ALKPHOS 55 50  BILITOT <0.1* 0.6  PROT 6.0* 6.4*  ALBUMIN 1.6* 1.5*   No results for input(s): LIPASE, AMYLASE in the last 168 hours. No results for input(s): AMMONIA in the last 168 hours. Coagulation Profile:  Recent Labs Lab 11/22/16 1236 11/22/16 2040  INR 1.03 1.06   Cardiac Enzymes: No results for input(s): CKTOTAL, CKMB, CKMBINDEX, TROPONINI in the last 168 hours. BNP (last 3 results) No results for input(s): PROBNP in the last 8760 hours. HbA1C: No results for input(s): HGBA1C in the last 72 hours. CBG:  Recent Labs Lab 11/23/16 0021 11/23/16 0414 11/23/16 0852 11/23/16 1157 11/23/16 1643  GLUCAP 114* 107* 90 106* 98   Lipid Profile: No results for input(s): CHOL, HDL, LDLCALC, TRIG, CHOLHDL, LDLDIRECT in the last 72 hours. Thyroid Function Tests: No results for input(s): TSH, T4TOTAL, FREET4, T3FREE, THYROIDAB in the last 72 hours. Anemia Panel: No results for input(s): VITAMINB12, FOLATE, FERRITIN, TIBC, IRON, RETICCTPCT in the last 72 hours. Urine analysis:    Component Value Date/Time   COLORURINE YELLOW 10/06/2016 1807   APPEARANCEUR CLEAR 11/01/2016 1807   LABSPEC <1.005 (L) 10/21/2016 1807   PHURINE 7.0 10/29/2016 1807   GLUCOSEU NEGATIVE 11/02/2016 1807   HGBUR LARGE (A) 10/14/2016 1807   BILIRUBINUR NEGATIVE 10/16/2016 1807   KETONESUR NEGATIVE 10/28/2016 1807   PROTEINUR 30 (A) 10/05/2016 1807   UROBILINOGEN 1.0 11/13/2011 2010   NITRITE NEGATIVE 10/29/2016 1807    LEUKOCYTESUR NEGATIVE 10/07/2016 1807   Sepsis Labs: '@LABRCNTIP'$ (procalcitonin:4,lacticidven:4)  ) Recent Results (from the past 240 hour(s))  Culture, bal-quantitative     Status: Abnormal   Collection Time: 11/17/16  1:51 PM  Result Value Ref Range Status   Specimen Description BRONCHIAL ALVEOLAR LAVAGE  Final   Special Requests NONE  Final   Gram Stain   Final    FEW WBC PRESENT, PREDOMINANTLY PMN FEW GRAM POSITIVE RODS RARE GRAM POSITIVE COCCI IN CLUSTERS    Culture (A)  Final    60,000 COLONIES/mL PROTEUS MIRABILIS 30,000 COLONIES/mL GROUP B STREP(S.AGALACTIAE)ISOLATED TESTING AGAINST S. AGALACTIAE NOT ROUTINELY PERFORMED DUE TO PREDICTABILITY OF AMP/PEN/VAN SUSCEPTIBILITY.    Report Status 11/19/2016 FINAL  Final   Organism ID, Bacteria PROTEUS MIRABILIS (A)  Final      Susceptibility   Proteus mirabilis - MIC*    AMPICILLIN <=2 SENSITIVE Sensitive     CEFAZOLIN <=4 SENSITIVE Sensitive     CEFEPIME <=1 SENSITIVE Sensitive     CEFTAZIDIME <=1 SENSITIVE Sensitive     CEFTRIAXONE <=1 SENSITIVE Sensitive     CIPROFLOXACIN >=4 RESISTANT Resistant     GENTAMICIN <=1 SENSITIVE Sensitive     IMIPENEM 4 SENSITIVE Sensitive     TRIMETH/SULFA <=20 SENSITIVE Sensitive     AMPICILLIN/SULBACTAM <=2 SENSITIVE Sensitive     PIP/TAZO <=4 SENSITIVE Sensitive     * 60,000 COLONIES/mL PROTEUS MIRABILIS  Culture, blood (routine x 2)     Status: None (Preliminary result)   Collection Time: 11/22/16 12:35 PM  Result Value Ref Range Status   Specimen Description BLOOD LEFT ARM  Final   Special Requests BOTTLES DRAWN AEROBIC ONLY 5CC  Final   Culture NO GROWTH < 24 HOURS  Final   Report Status PENDING  Incomplete  Culture, blood (routine x 2)     Status: None (Preliminary result)   Collection Time: 11/22/16 12:40 PM  Result Value Ref Range Status   Specimen Description BLOOD LEFT ASSIST CONTROL  Final   Special Requests BOTTLES DRAWN AEROBIC ONLY 5CC  Final   Culture NO GROWTH < 24  HOURS  Final   Report Status PENDING  Incomplete         Radiology Studies: Ct Chest Wo Contrast  Result Date: 11/22/2016 CLINICAL DATA:  Left chest opacification EXAM: CT CHEST WITHOUT CONTRAST TECHNIQUE: Multidetector CT imaging of the chest was performed following the standard protocol without IV contrast. COMPARISON:  Multiple chest radiographs most recently yesterday. FINDINGS: Cardiovascular: Heart size is normal. Very small amount of pericardial fluid. No aortic or coronary calcification identified. Mediastinum/Nodes: Mediastinal shift towards the right. No pathologically enlarged lymph nodes identified. Lungs/Pleura: Complete collapse of left lung. Large pleural effusion surrounding the left lung. Mass effect with mediastinal shift towards the right. Moderate-sized dependent effusion on the right with dependent pulmonary atelectasis. Nondependent lung is largely clear. Abnormal density in the right upper lobe suggests developing bronchopneumonia. Focal areas of irregular nodular shadows within the right lung are nonspecific and could represent foci of atelectasis or pneumonia. Neoplastic mass lesions seem unlikely given the generally clear previous chest radiography on the right. Upper Abdomen: Negative Musculoskeletal: Normal IMPRESSION: Very large left effusion. Complete collapse of left lung. Mass effect with left-to-right mediastinal shift. Moderate size right effusion layering dependently. Dependent pulmonary atelectasis. Possible developing pneumonia right upper lobe. Areas of patchy pulmonary density in the right lung could represent atelectasis or nodular infiltrates. Mass lesions seem less likely, but follow-up should be performed. Electronically Signed   By: Nelson Chimes M.D.   On: 11/22/2016 07:18   Dg Chest Port 1 View  Result Date: 11/23/2016 CLINICAL DATA:  Status post chest tube placement EXAM: PORTABLE CHEST 1 VIEW COMPARISON:  11/22/2016 FINDINGS: Recent chest tube placement  has been performed. No pneumothorax is noted. Significant reduction in pleural fluid is seen. There is also significant improved aeration in the left lung. Persistent infiltrate is noted. Right PICC line and tracheostomy tube are again noted. IMPRESSION: Status post chest tube placement with significant re-expansion of the left lung and reduction in left pleural effusion. Electronically Signed   By: Inez Catalina M.D.   On: 11/23/2016 15:51        Scheduled Meds: . atorvastatin  20 mg Per Tube q1800  . budesonide (PULMICORT) nebulizer solution  0.5 mg Nebulization BID  . cefTRIAXone (ROCEPHIN)  IV  1 g Intravenous Q24H  . chlorhexidine gluconate (MEDLINE KIT)  15 mL Mouth Rinse BID  . clonazePAM  0.25 mg Per Tube Q6H  . famotidine  20 mg Per Tube BID  . fludrocortisone  0.1 mg Per Tube Daily  . folic acid  1 mg Per Tube Daily  . free water  200 mL Per Tube Q4H  . heparin  5,000 Units Subcutaneous Q8H  . hydrocortisone  10 mg Per Tube QHS  . hydrocortisone  5 mg Per Tube Daily  . ipratropium  0.5 mg Nebulization QID  . levalbuterol  0.63 mg Nebulization QID  . levETIRAcetam  1,500 mg Per Tube BID  . mouth rinse  15 mL Mouth Rinse QID  . metoprolol  10 mg Intravenous Q6H  . QUEtiapine  50 mg Per Tube BID  . thiamine  100 mg Per Tube Daily  . Valproate Sodium  750 mg Per Tube Q8H   Continuous Infusions: . amiodarone 30 mg/hr (11/23/16 1211)  . feeding supplement (VITAL AF 1.2 CAL) 1,000 mL (11/23/16 0147)     LOS: 24 days    Time spent: 40 minutes    WOODS, Geraldo Docker, MD [facility] Pager [phone]   If 7PM-7AM, please contact night-coverage [email or web address] Password [email or web address] 11/23/2016, 5:50 PM

## 2016-11-23 NOTE — Progress Notes (Signed)
PROGRESS NOTE                                                                                                                                                                                                             Patient Demographics:    Dale [name], is a 50 y.o. male, DOB - 1966-05-23, SAY:[id number]  Admit date - 10/21/2016   Admitting Physician Rush Landmark, MD  Outpatient Primary MD for the patient is No PCP Per Patient  LOS - 24    Chief Complaint  Patient presents with  . 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 Chest x-ray showing left upper lobe consolidation. Required intubation for airway protection. Transferred to [facility] for [facility] care.  Significant Events: 11/26 Intubated (endotracheally) at [facility] ED - transferred to [facility]  11/29 bronch - left inspissated pus 12/14 - proteus from BAL 12/18 - Worsening resp status, increased WOB. S/p FOB 12/14, CXR 12/17 still ccomplete opacification L hemithorax.     Subjective:   Patient trached poorly responsive, (received Ativan early morning for frequent movement of his left arm hitting onto the trach)   Assessment  & Plan :   Acute respiratory failure with hypoxia secondary to extensive left lung Enterobacter pneumonia leading to ARDS and sepsis: -Status post tracheostomy for third time, PCCM following. Worsening respiratory function on 12/18. CT chest showing large left effusion with complete left lung collapse and moderate right-sided effusion. -Chest tube placed by pulmonary today would good lung expansion on follow-up imaging. -Continue IV Rocephin. Continue nebulization and bronchodilators.  -Palliative care consulted.  Paroxysmal atrial fibrillation:  Heart rate improved  today. Currently on IV metoprolol and amiodarone. Monitor closely.  Acute on chronic encephalopathy: Mental status unimproved. Has chronic encephalopathy with meningitis communication.  Chronic systolic heart failure: Euvolemic on exam. Continue supportive care.  Adrenal insufficiency (Addison's disease) ([place]): Continue Florinef and hydrocortisone.  Hypokalemia/hypomagnesemia:  Replenished.  Severe protein caloric malnutrition:  Continue to feeding.   Code Status : Full code, overall prognosis is guarded.  Family Communication  : None at bedside  Disposition Plan  : Needs SNF  Barriers For Discharge : Active symptoms  Consults  :  PC CM Palliative care  Procedures  :   DVT Prophylaxis  :  Heparin  Lab Results  Component Value Date   PLT 289 11/23/2016    Antibiotics  :    Anti-infectives    Start     Dose/Rate Route Frequency Ordered Stop   11/18/16 1500  cefTRIAXone (ROCEPHIN) 1 g in dextrose 5 % 50 mL IVPB     1 g 100 mL/hr over 30 Minutes Intravenous Every 24 hours 11/18/16 1429     11/11/16 2000  ciprofloxacin (CIPRO) tablet 750 mg     750 mg Per Tube 2 times daily 11/11/16 0957 11/11/16 2010   11/20/2016 1600  ciprofloxacin (CIPRO) tablet 750 mg  Status:  Discontinued     750 mg Oral 2 times daily 11/17/2016 1331 11/11/16 0957   11/02/16 1400  cefTRIAXone (ROCEPHIN) 1 g in dextrose 5 % 50 mL IVPB  Status:  Discontinued     1 g 100 mL/hr over 30 Minutes Intravenous Every 24 hours 11/02/16 1254 11/29/2016 1328   11/01/16 1000  vancomycin (VANCOCIN) IVPB 1000 mg/200 mL premix  Status:  Discontinued     1,000 mg 200 mL/hr over 60 Minutes Intravenous Every 12 hours 11/01/16 0818 11/03/16 1026   10/31/16 1600  vancomycin (VANCOCIN) IVPB 750 mg/150 ml premix  Status:  Discontinued     750 mg 150 mL/hr over 60 Minutes Intravenous Every 24 hours 10/27/2016 1624 11/01/16 0818   10/31/16 0000  piperacillin-tazobactam (ZOSYN) IVPB 3.375 g  Status:  Discontinued      3.375 g 12.5 mL/hr over 240 Minutes Intravenous Every 8 hours 10/28/2016 1623 11/02/16 1254   10/14/2016 1600  piperacillin-tazobactam (ZOSYN) IVPB 3.375 g     3.375 g 100 mL/hr over 30 Minutes Intravenous  Once 11/02/2016 1553 11/02/2016 1637   10/23/2016 1600  vancomycin (VANCOCIN) IVPB 1000 mg/200 mL premix     1,000 mg 200 mL/hr over 60 Minutes Intravenous  Once 10/25/2016 1553 10/18/2016 1731        Objective:   Vitals:   11/23/16 1000 11/23/16 1217 11/23/16 1234 11/23/16 1628  BP: 108/79 (!) 136/96    Pulse: 89 73    Resp: (!) 22 (!) 21    Temp:  97.6 F (36.4 C)    TempSrc:  Oral    SpO2: 95% 97% 98% 100%  Weight:      Height:        Wt Readings from Last 3 Encounters:  11/23/16 72.6 kg (160 lb)  08/10/16 64.7 kg (142 lb 10.2 oz)  07/11/16 60.3 kg (133 lb)     Intake/Output Summary (Last 24 hours) at 11/23/16 1640 Last data filed at 11/23/16 1554  Gross per 24 hour  Intake           1714.1 ml  Output             3000 ml  Net          -1285.9 ml     Physical Exam  Gen: Somnolent, poorly arousable HEENT: Trached, Chest: Breath sounds bilaterally CVS: S1 and S2 irregular, no murmurs GI: soft, nondistended, G tube in place Musculoskeletal: warm, no edema CNS: Unarousable, somnolent    Data Review:    CBC  Recent Labs Lab 11/17/16 0441 11/20/16 0453 11/23/16 0500  WBC 12.0* 12.6* 13.2*  HGB 9.0* 8.4* 7.8*  HCT 28.5* 26.9* 24.9*  PLT 383 395 289  MCV 93.8 91.5 94.7  MCH 29.6 28.6 29.7  MCHC 31.6  31.2 31.3  RDW 17.7* 17.5* 18.3*    Chemistries   Recent Labs Lab 11/17/16 0441 11/18/16 0500 11/20/16 0453 11/21/16 0442 11/23/16 0500  NA 139 140 143 144 145  K 3.3* 4.4 3.6 3.3* 3.2*  CL 98* 101 100* 101 104  CO2 '30 29 30 31 31  '$ GLUCOSE 94 97 93 113* 107*  BUN '11 12 12 12 12  '$ CREATININE 0.63 0.61 0.74 0.75 0.73  CALCIUM 8.4* 8.8* 8.7* 8.6* 8.7*  MG 2.1  --  1.9  --   --   AST  --  17 15  --   --   ALT  --  19 19  --   --   ALKPHOS  --  55 50   --   --   BILITOT  --  <0.1* 0.6  --   --    ------------------------------------------------------------------------------------------------------------------ No results for input(s): CHOL, HDL, LDLCALC, TRIG, CHOLHDL, LDLDIRECT in the last 72 hours.  Lab Results  Component Value Date   HGBA1C 5.3 05/20/2016   ------------------------------------------------------------------------------------------------------------------ No results for input(s): TSH, T4TOTAL, T3FREE, THYROIDAB in the last 72 hours.  Invalid input(s): FREET3 ------------------------------------------------------------------------------------------------------------------ No results for input(s): VITAMINB12, FOLATE, FERRITIN, TIBC, IRON, RETICCTPCT in the last 72 hours.  Coagulation profile  Recent Labs Lab 11/22/16 1236 11/22/16 2040  INR 1.03 1.06    No results for input(s): DDIMER in the last 72 hours.  Cardiac Enzymes No results for input(s): CKMB, TROPONINI, MYOGLOBIN in the last 168 hours.  Invalid input(s): CK ------------------------------------------------------------------------------------------------------------------    Component Value Date/Time   BNP 23.8 03/20/2016 1800    Inpatient Medications  Scheduled Meds: . atorvastatin  20 mg Per Tube q1800  . budesonide (PULMICORT) nebulizer solution  0.5 mg Nebulization BID  . cefTRIAXone (ROCEPHIN)  IV  1 g Intravenous Q24H  . chlorhexidine gluconate (MEDLINE KIT)  15 mL Mouth Rinse BID  . clonazePAM  0.25 mg Per Tube Q6H  . famotidine  20 mg Per Tube BID  . fludrocortisone  0.1 mg Per Tube Daily  . folic acid  1 mg Per Tube Daily  . free water  200 mL Per Tube Q4H  . heparin  5,000 Units Subcutaneous Q8H  . hydrocortisone  10 mg Per Tube QHS  . hydrocortisone  5 mg Per Tube Daily  . ipratropium  0.5 mg Nebulization QID  . levalbuterol  0.63 mg Nebulization QID  . levETIRAcetam  1,500 mg Per Tube BID  . mouth rinse  15 mL Mouth Rinse  QID  . metoprolol  10 mg Intravenous Q6H  . QUEtiapine  50 mg Per Tube BID  . thiamine  100 mg Per Tube Daily  . Valproate Sodium  750 mg Per Tube Q8H   Continuous Infusions: . amiodarone 30 mg/hr (11/23/16 1211)  . feeding supplement (VITAL AF 1.2 CAL) 1,000 mL (11/23/16 0147)   PRN Meds:.sodium chloride, acetaminophen, fentaNYL (SUBLIMAZE) injection, LORazepam, sodium chloride flush  Micro Results Recent Results (from the past 240 hour(s))  Culture, bal-quantitative     Status: Abnormal   Collection Time: 11/17/16  1:51 PM  Result Value Ref Range Status   Specimen Description BRONCHIAL ALVEOLAR LAVAGE  Final   Special Requests NONE  Final   Gram Stain   Final    FEW WBC PRESENT, PREDOMINANTLY PMN FEW GRAM POSITIVE RODS RARE GRAM POSITIVE COCCI IN CLUSTERS    Culture (A)  Final    60,000 COLONIES/mL PROTEUS MIRABILIS 30,000 COLONIES/mL GROUP B STREP(S.AGALACTIAE)ISOLATED TESTING AGAINST S.  AGALACTIAE NOT ROUTINELY PERFORMED DUE TO PREDICTABILITY OF AMP/PEN/VAN SUSCEPTIBILITY.    Report Status 11/19/2016 FINAL  Final   Organism ID, Bacteria PROTEUS MIRABILIS (A)  Final      Susceptibility   Proteus mirabilis - MIC*    AMPICILLIN <=2 SENSITIVE Sensitive     CEFAZOLIN <=4 SENSITIVE Sensitive     CEFEPIME <=1 SENSITIVE Sensitive     CEFTAZIDIME <=1 SENSITIVE Sensitive     CEFTRIAXONE <=1 SENSITIVE Sensitive     CIPROFLOXACIN >=4 RESISTANT Resistant     GENTAMICIN <=1 SENSITIVE Sensitive     IMIPENEM 4 SENSITIVE Sensitive     TRIMETH/SULFA <=20 SENSITIVE Sensitive     AMPICILLIN/SULBACTAM <=2 SENSITIVE Sensitive     PIP/TAZO <=4 SENSITIVE Sensitive     * 60,000 COLONIES/mL PROTEUS MIRABILIS  Culture, blood (routine x 2)     Status: None (Preliminary result)   Collection Time: 11/22/16 12:35 PM  Result Value Ref Range Status   Specimen Description BLOOD LEFT ARM  Final   Special Requests BOTTLES DRAWN AEROBIC ONLY 5CC  Final   Culture NO GROWTH < 24 HOURS  Final   Report  Status PENDING  Incomplete  Culture, blood (routine x 2)     Status: None (Preliminary result)   Collection Time: 11/22/16 12:40 PM  Result Value Ref Range Status   Specimen Description BLOOD LEFT ASSIST CONTROL  Final   Special Requests BOTTLES DRAWN AEROBIC ONLY 5CC  Final   Culture NO GROWTH < 24 HOURS  Final   Report Status PENDING  Incomplete    Radiology Reports Dg Abd 1 View  Result Date: 10/31/2016 CLINICAL DATA:  Abdominal distention EXAM: ABDOMEN - 1 VIEW COMPARISON:  07/26/2016 FINDINGS: A pull-through gastrostomy tube has been placed. It projects over the gastric bubble. No disproportionate dilatation of bowel. There is no obvious free intraperitoneal gas. Hazy airspace disease is suspected in the left lower lung zone. IMPRESSION: Nonobstructive bowel gas pattern. Pull-through gastrostomy tube placement. Possible left lower lobe pneumonia. Correlation with chest radiographs are recommended. Electronically Signed   By: Jolaine Click M.D.   On: 10/31/2016 12:06   Ct Chest Wo Contrast  Result Date: 11/22/2016 CLINICAL DATA:  Left chest opacification EXAM: CT CHEST WITHOUT CONTRAST TECHNIQUE: Multidetector CT imaging of the chest was performed following the standard protocol without IV contrast. COMPARISON:  Multiple chest radiographs most recently yesterday. FINDINGS: Cardiovascular: Heart size is normal. Very small amount of pericardial fluid. No aortic or coronary calcification identified. Mediastinum/Nodes: Mediastinal shift towards the right. No pathologically enlarged lymph nodes identified. Lungs/Pleura: Complete collapse of left lung. Large pleural effusion surrounding the left lung. Mass effect with mediastinal shift towards the right. Moderate-sized dependent effusion on the right with dependent pulmonary atelectasis. Nondependent lung is largely clear. Abnormal density in the right upper lobe suggests developing bronchopneumonia. Focal areas of irregular nodular shadows within  the right lung are nonspecific and could represent foci of atelectasis or pneumonia. Neoplastic mass lesions seem unlikely given the generally clear previous chest radiography on the right. Upper Abdomen: Negative Musculoskeletal: Normal IMPRESSION: Very large left effusion. Complete collapse of left lung. Mass effect with left-to-right mediastinal shift. Moderate size right effusion layering dependently. Dependent pulmonary atelectasis. Possible developing pneumonia right upper lobe. Areas of patchy pulmonary density in the right lung could represent atelectasis or nodular infiltrates. Mass lesions seem less likely, but follow-up should be performed. Electronically Signed   By: Paulina Fusi M.D.   On: 11/22/2016 07:18   Dg Chest  Port 1 View  Result Date: 11/23/2016 CLINICAL DATA:  Status post chest tube placement EXAM: PORTABLE CHEST 1 VIEW COMPARISON:  11/22/2016 FINDINGS: Recent chest tube placement has been performed. No pneumothorax is noted. Significant reduction in pleural fluid is seen. There is also significant improved aeration in the left lung. Persistent infiltrate is noted. Right PICC line and tracheostomy tube are again noted. IMPRESSION: Status post chest tube placement with significant re-expansion of the left lung and reduction in left pleural effusion. Electronically Signed   By: Inez Catalina M.D.   On: 11/23/2016 15:51   Dg Chest Portable 1 View  Result Date: 11/21/2016 CLINICAL DATA:  Pleural effusion.  Tracheostomy tube . EXAM: PORTABLE CHEST 1 VIEW COMPARISON:  11/20/2016. FINDINGS: Tracheostomy tube noted in stable position. Right PICC line noted in stable position. Complete opacification of the left hemithorax again noted. Shift of the mediastinum heart the right noted. These findings again suggest a left pleural effusion. Underlying left lung consolidation cannot be excluded. IMPRESSION: 1. Tracheostomy tube in stable position. 2. Unchanged chest with complete opacification of the  left hemithorax. Findings consistent with left pleural effusion. Underlying left lung consolidation cannot be excluded . Electronically Signed   By: Marcello Moores  Register   On: 11/21/2016 10:24   Dg Chest Port 1 View  Result Date: 11/20/2016 CLINICAL DATA:  Pleural effusion versus mucous plugging. Left lung white out. EXAM: PORTABLE CHEST 1 VIEW COMPARISON:  11/19/2016 FINDINGS: Tracheostomy tube and right PICC line remain in place, unchanged. Complete opacification of the left hemithorax again noted with shift of the mediastinal structures to the right suggesting at least component of pleural effusion. No confluent opacity on the right. IMPRESSION: No change since prior study. Electronically Signed   By: Rolm Baptise M.D.   On: 11/20/2016 08:08   Dg Chest Port 1 View  Result Date: 11/19/2016 CLINICAL DATA:  Collapse left lung EXAM: PORTABLE CHEST 1 VIEW COMPARISON:  11/18/2016 FINDINGS: Tracheostomy tube and right PICC line remain in place, unchanged. Complete opacification of the left hemithorax is stable. Shift of mediastinal structures to the right. Mild vascular congestion on the right. No visible effusion on the right. IMPRESSION: Complete opacification of the left hemithorax with mediastinal shift to the right. No change since prior study. Electronically Signed   By: Rolm Baptise M.D.   On: 11/19/2016 07:57   Dg Chest Port 1 View  Result Date: 11/18/2016 CLINICAL DATA:  Atelectasis. EXAM: PORTABLE CHEST 1 VIEW COMPARISON:  Radiograph of November 17, 2016. FINDINGS: Stable complete opacification of left hemithorax without significant mediastinal shift. Tracheostomy tube is unchanged in position. Right-sided PICC line is unchanged. No pneumothorax is noted. Bony thorax is unremarkable. Gastrostomy tube is seen in visualized portion of upper abdomen. IMPRESSION: Stable complete opacification of the left hemithorax without significant mediastinal shift. This is concerning for pleural effusion.  Electronically Signed   By: Marijo Conception, M.D.   On: 11/18/2016 14:12   Dg Chest Port 1 View  Result Date: 11/17/2016 CLINICAL DATA:  Acute hypoxemic respiratory failure portable chest x-ray of 11/16/2016 EXAM: PORTABLE CHEST 1 VIEW COMPARISON:  Chest x-ray of 11/16/2016, 08/10/2016, and 02/13/2016 FINDINGS: There is little change in complete opacification of the left hemithorax. No significant mediastinal shift is noted therefore some of the opacification may be due to atelectasis as well as fluid or mass effect. The right lung is grossly clear. Tracheostomy and right central venous line remain. IMPRESSION: 1. No change in complete opacification of the left hemithorax  with no significant mediastinal shift. 2. Right lung is clear. 3. Tracheostomy and right central venous line are unchanged in position. Electronically Signed   By: Ivar Drape M.D.   On: 11/17/2016 08:06   Dg Chest Port 1 View  Result Date: 11/16/2016 CLINICAL DATA:  Acute hypoxia today EXAM: PORTABLE CHEST 1 VIEW COMPARISON:  Portable chest x-ray of November 15, 2016 FINDINGS: There is persistent complete opacification of the left hemithorax. There is mild shift of mediastinum toward the right. The lung markings throughout the right lung are more conspicuous today. The pulmonary vascularity is not clearly engorged. The tracheostomy appliance tip projects at the level of the clavicular heads. The PICC line tip projects over the proximal SVC. IMPRESSION: Complete opacification of the left hemithorax, stable. Mild interstitial prominence throughout the right lung which is new and may reflect interstitial edema or less likely pneumonia. Electronically Signed   By: David  Martinique M.D.   On: 11/16/2016 15:08   Dg Chest Port 1 View  Result Date: 11/15/2016 CLINICAL DATA:  Pneumonia EXAM: PORTABLE CHEST 1 VIEW COMPARISON:  11/05/2016 FINDINGS: Tracheostomy remains in good position. Complete opacification left hemithorax compatible with  further collapse compared with the prior study. Heart is shifted mildly to the right suggesting underlying left pleural effusion. Right lung clear.  Right arm PICC tip in the SVC. IMPRESSION: Further collapse of the left lung with complete opacification of the left chest. Mild shift of the mediastinal structures to the right. Endobronchial obstruction due to mucous plugging or tumor possible. Underlying pneumonia, tumor, or empyema are possible. Electronically Signed   By: Franchot Gallo M.D.   On: 11/15/2016 08:31   Dg Chest Port 1 View  Result Date: 11/05/2016 CLINICAL DATA:  Acute respiratory failure. EXAM: PORTABLE CHEST 1 VIEW COMPARISON:  11/03/2016 FINDINGS: Endotracheal tube has been removed and the patient now has a tracheostomy tube. PICC line tip in the SVC region. Diffuse densities throughout the left hemithorax are not significantly changed. Right lung remains clear. Heart size appears to be grossly stable and within normal limits. IMPRESSION: Diffuse densities throughout the left chest are minimally changed. Interval placement of a tracheostomy tube. Electronically Signed   By: Markus Daft M.D.   On: 11/05/2016 09:21   Dg Chest Port 1 View  Result Date: 11/03/2016 CLINICAL DATA:  Acute onset respiratory failure. Shortness of breath. Initial encounter. EXAM: PORTABLE CHEST 1 VIEW COMPARISON:  Chest radiograph performed 11/01/2016 FINDINGS: The patient's endotracheal tube is seen ending 5 cm above the carina. A right PICC is noted ending about the mid SVC. There is persistent diffuse left-sided airspace opacification, concerning for pneumonia. A small left pleural effusion is suspected. No pneumothorax is seen. The cardiomediastinal silhouette is mildly enlarged. No acute osseous abnormalities are identified. IMPRESSION: 1. Endotracheal tube seen ending 5 cm above the carina. 2. Persistent diffuse left-sided airspace opacification, concerning for pneumonia. Suspect small left pleural effusion. 3.  Mild cardiomegaly. Electronically Signed   By: Garald Balding M.D.   On: 11/03/2016 06:22   Dg Chest Port 1 View  Result Date: 11/01/2016 CLINICAL DATA:  Hypoxemic respiratory failure with shortness of breath EXAM: PORTABLE CHEST 1 VIEW COMPARISON:  10/15/2016 FINDINGS: Endotracheal tube tip is approximately 2.9 cm superior to the carina. Right upper extremity catheter has been inserted and the tip projects over the mid SVC. Interim worsening of diffuse air space consolidation of the left hemi thorax. Slightly improved aeration of the right lung base. Cardiomediastinal silhouette is obscured. IMPRESSION: 1.  Support lines and tubes as above. Right upper extremity catheter tip overlies the SVC. 2. Significant worsening of diffuse airspace disease in the left thorax. 3. Improved aeration of the right base Electronically Signed   By: Donavan Foil M.D.   On: 11/01/2016 04:01   Dg Chest Port 1 View  Result Date: 10/13/2016 CLINICAL DATA:  Endotracheal tube in place after transport from [facility]. EXAM: PORTABLE CHEST 1 VIEW COMPARISON:  10/24/2016 FINDINGS: Endotracheal tube with tip measuring 5.8 cm above the carina. Shallow inspiration. Right hemidiaphragm is obscured suggesting small effusion or infiltration. Focal consolidation in the left mid lung likely representing pneumonia. No pneumothorax. Normal heart size and pulmonary vasculature. IMPRESSION: Endotracheal tube appears in satisfactory position. Infiltration or effusion in the right lung base obscuring the right hemidiaphragm. Focal consolidation in the left mid lung consistent with pneumonia. Electronically Signed   By: Lucienne Capers M.D.   On: 10/07/2016 22:51   Dg Chest Portable 1 View  Result Date: 10/24/2016 CLINICAL DATA:  Status post intubation. EXAM: PORTABLE CHEST 1 VIEW COMPARISON:  One-view chest x-ray from the same day at 4:10 p.m. FINDINGS: The patient is an avid intubated. The endotracheal tube terminates 3.4 cm above the  carina, in satisfactory position. Left upper lobe pneumonia is again seen. Extensive asymmetric edema is present on the left as well. Bibasilar airspace disease likely reflects atelectasis. Basilar disease is likely exaggerated by reversed lordotic positioning. IMPRESSION: 1. Satisfactory positioning of the endotracheal tube 3.5 cm above the carina. 2. Persistent left lobe pneumonia. 3. Asymmetric left-sided edema. 4. Bibasilar airspace disease likely reflects atelectasis. Electronically Signed   By: San Morelle M.D.   On: 10/12/2016 18:19   Dg Chest Port 1 View  Result Date: 10/29/2016 CLINICAL DATA:  Code sepsis, nausea, vomiting, diarrhea, fever. EXAM: PORTABLE CHEST 1 VIEW COMPARISON:  08/10/2016. FINDINGS: Trachea is midline. Heart size is accentuated by portable AP supine technique. There is airspace consolidation in the left upper lobe. Minimal consolidation in the medial aspect of the lower right hemi thorax. No pleural fluid. IMPRESSION: 1. Left upper lobe pneumonia. Followup PA and lateral chest X-ray is recommended in 3-4 weeks following trial of antibiotic therapy to ensure resolution and exclude underlying malignancy. 2. Atelectasis or pneumonia at the medial right lung base. Electronically Signed   By: Lorin Picket M.D.   On: 10/11/2016 16:37    Time Spent in minutes  35   Louellen Molder M.D on 11/23/2016 at 4:40 PM  Between 7am to 7pm - Pager - [phone]  After 7pm go to [email or web address] - password [facility]  [facility] -  Office  [phone]

## 2016-11-23 NOTE — Progress Notes (Signed)
Asked to assist with drug administration during CT insertion by Dr. Tyson [name].  On arrival patient supine in bed - Dr. Tyson [name] at bedside - patient had received Fentanyl and Versed but was still restless in bed.  20 mg IV Etomidate given per order Dr. Tyson [name].  Patinet currently is trached and on the ventilator with a set rate.  BP, HR and O2 sats montiored during procedure - stable - see flow sheet for numbers.  Patient tol procedure well large amount straw colored fluid returned - connected to pleuravac with 20 cm suction - no air leak noted - 700 cc in Pleuravac - some in bed - Dr. Tyson [name] aware.  - VSS.  Assisted with cleaning patient post CT insertion.  Tol well. Called for stat PCXR.  Handoff to [facility] RN.

## 2016-11-23 NOTE — Procedures (Signed)
Chest Tube Insertion Procedure Note  Etomidate with RR RN administer with me, on monitor and vent support Fent, versed  Indications:  Clinically significant Effusion  Pre-operative Diagnosis: Effusion  Post-operative Diagnosis: Effusion  Procedure Details  Informed consent was obtained for the procedure, including sedation.  Risks of lung perforation, hemorrhage, arrhythmia, and adverse drug reaction were discussed.   After sterile skin prep, using standard technique, a 28 French tube was placed in the left lateral 7th rib space.  Findings: 2500 ml of amber clear fluid obtained  Estimated Blood Loss:  Minimal         Specimens:  Sent serosanguinous fluid              Complications:  None; patient tolerated the procedure well.         Disposition: sdu bed         Condition: stable  Attending Attestation: I performed the procedure.  [name] [name], MD, FACP Pgr: [phone] [facility]

## 2016-11-24 DIAGNOSIS — G931 Anoxic brain damage, not elsewhere classified: Secondary | ICD-10-CM

## 2016-11-24 DIAGNOSIS — E43 Unspecified severe protein-calorie malnutrition: Secondary | ICD-10-CM

## 2016-11-24 DIAGNOSIS — E2740 Unspecified adrenocortical insufficiency: Secondary | ICD-10-CM

## 2016-11-24 DIAGNOSIS — Z930 Tracheostomy status: Secondary | ICD-10-CM

## 2016-11-24 LAB — CBC
HCT: 25.7 % — ABNORMAL LOW (ref 39.0–52.0)
Hemoglobin: 8.1 g/dL — ABNORMAL LOW (ref 13.0–17.0)
MCH: 28.7 pg (ref 26.0–34.0)
MCHC: 31.5 g/dL (ref 30.0–36.0)
MCV: 91.1 fL (ref 78.0–100.0)
PLATELETS: 282 10*3/uL (ref 150–400)
RBC: 2.82 MIL/uL — AB (ref 4.22–5.81)
RDW: 17.7 % — ABNORMAL HIGH (ref 11.5–15.5)
WBC: 12.4 10*3/uL — AB (ref 4.0–10.5)

## 2016-11-24 LAB — BASIC METABOLIC PANEL
ANION GAP: 10 (ref 5–15)
BUN: 10 mg/dL (ref 6–20)
CO2: 29 mmol/L (ref 22–32)
Calcium: 8.2 mg/dL — ABNORMAL LOW (ref 8.9–10.3)
Chloride: 101 mmol/L (ref 101–111)
Creatinine, Ser: 0.69 mg/dL (ref 0.61–1.24)
Glucose, Bld: 104 mg/dL — ABNORMAL HIGH (ref 65–99)
POTASSIUM: 3.3 mmol/L — AB (ref 3.5–5.1)
SODIUM: 140 mmol/L (ref 135–145)

## 2016-11-24 LAB — GLUCOSE, CAPILLARY
GLUCOSE-CAPILLARY: 105 mg/dL — AB (ref 65–99)
GLUCOSE-CAPILLARY: 130 mg/dL — AB (ref 65–99)
Glucose-Capillary: 121 mg/dL — ABNORMAL HIGH (ref 65–99)
Glucose-Capillary: 100 mg/dL — ABNORMAL HIGH (ref 65–99)
Glucose-Capillary: 98 mg/dL (ref 65–99)
Glucose-Capillary: 95 mg/dL (ref 65–99)

## 2016-11-24 LAB — LACTATE DEHYDROGENASE: LDH: 288 U/L — AB (ref 98–192)

## 2016-11-24 LAB — CHOLESTEROL, TOTAL: CHOLESTEROL: 74 mg/dL (ref 0–200)

## 2016-11-24 LAB — PROTEIN, TOTAL: TOTAL PROTEIN: 4.8 g/dL — AB (ref 6.5–8.1)

## 2016-11-24 NOTE — Progress Notes (Signed)
NP made aware of temp 92.2 rectally, warming blanket applied, BP 81/52 500 Nacl bolus given

## 2016-11-25 LAB — GLUCOSE, CAPILLARY
GLUCOSE-CAPILLARY: 98 mg/dL (ref 65–99)
GLUCOSE-CAPILLARY: 99 mg/dL (ref 65–99)
Glucose-Capillary: 111 mg/dL — ABNORMAL HIGH (ref 65–99)
Glucose-Capillary: 79 mg/dL (ref 65–99)
Glucose-Capillary: 90 mg/dL (ref 65–99)
Glucose-Capillary: 93 mg/dL (ref 65–99)
Glucose-Capillary: 94 mg/dL (ref 65–99)

## 2016-11-25 MED ORDER — CEFTRIAXONE SODIUM 1 G IJ SOLR
1.0000 g | INTRAMUSCULAR | Status: AC
  Administered 2016-11-25: 1 g via INTRAVENOUS
  Filled 2016-11-25: qty 10

## 2016-11-25 NOTE — Progress Notes (Signed)
CSW [facility] to [facility]  Burna SisJenna H. Angeline Trick, LCSW Clinical Social Worker [phone]

## 2016-11-25 NOTE — Progress Notes (Signed)
PULMONARY / CRITICAL CARE MEDICINE   Name: Dale HollowCraig D Rappa MRN: [id number] DOB: 10/15/1966    ADMISSION DATE:  10/06/2016  CHIEF COMPLAINT:  Nausea and Vomiting  BRIEF SUMMARY: [age] yom w/ severe neurological deficits after meningitis.  He was brought to the ED with decreased level of consciousness and nausea/vomiting/diarrhea. A CXR showed consolidation of the left upper lobe. [facility] Tracheostomy on 12/1 for prolonged ventilation   SUBJECTIVE: Remains unresponsive Weaning well today on pressure support 5/5  VITAL SIGNS: BP (!) 87/59 (BP Location: Left Arm)   Pulse 97   Temp 98.7 F (37.1 C) (Axillary)   Resp (!) 23   Ht 5\' 8"  (1.727 m)   Wt 154 lb (69.9 kg)   SpO2 92%   BMI 23.42 kg/m    VENTILATOR SETTINGS: Vent Mode: CPAP;PSV FiO2 (%):  [40 %-50 %] 40 % Set Rate:  [20 bmp] 20 bmp Vt Set:  [420 mL] 420 mL PEEP:  [5 cmH20] 5 cmH20 Pressure Support:  [5 cmH20] 5 cmH20 Plateau Pressure:  [10 cmH20-16 cmH20] 16 cmH20  INTAKE / OUTPUT: I/O last 3 completed shifts: In: 4108.4 [I.V.:586.4; NG/GT:3471.9; IV Piggyback:50] Out: 2370 [Urine:2200; Chest Tube:170]  PHYSICAL EXAMINATION: General: Middle aged male, chronically ill appearing, not interactive, int agitation Neuro: eyes closed.  Does not follow commands HEENT: perrl Cardiovascular: RRR, no M/R/G.  Lungs: reduced BS left Abdomen: BS x 4, soft, NT/ND.  Musculoskeletal: No gross deformities, no edema.  Skin: Intact, warm, no rashes.  LABS:  BMET  Recent Labs Lab 11/21/16 0442 11/23/16 0500 11/24/16 0720  NA 144 145 140  K 3.3* 3.2* 3.3*  CL 101 104 101  CO2 31 31 29   BUN 12 12 10   CREATININE 0.75 0.73 0.69  GLUCOSE 113* 107* 104*   Electrolytes  Recent Labs Lab 11/20/16 0453 11/21/16 0442 11/23/16 0500 11/24/16 0720  CALCIUM 8.7* 8.6* 8.7* 8.2*  MG 1.9  --   --   --     CBC  Recent Labs Lab 11/20/16 0453 11/23/16 0500 11/24/16 0720  WBC 12.6* 13.2*  12.4*  HGB 8.4* 7.8* 8.1*  HCT 26.9* 24.9* 25.7*  PLT 395 289 282    Coag's  Recent Labs Lab 11/22/16 1236 11/22/16 2040  APTT 48* 48*  INR 1.03 1.06    Sepsis Markers No results for input(s): LATICACIDVEN, PROCALCITON, O2SATVEN in the last 168 hours.  ABG  Recent Labs Lab 11/21/16 1213  PHART 7.452*  PCO2ART 48.1*  PO2ART 66.6*    Liver Enzymes  Recent Labs Lab 11/20/16 0453  AST 15  ALT 19  ALKPHOS 50  BILITOT 0.6  ALBUMIN 1.5*    Cardiac Enzymes No results for input(s): TROPONINI, PROBNP in the last 168 hours.  Glucose  Recent Labs Lab 11/24/16 1614 11/24/16 2035 11/25/16 0026 11/25/16 0343 11/25/16 0752 11/25/16 1214  GLUCAP 98 95 98 93 90 94    Imaging No results found.   STUDIES:  CXR 11/26 >> LUL PNA EEG 12/5 >> negative for seizure   CULTURES: Blood 11/26 >> coag neg staph 1/2  C. Diff 11/26 >> (-) Resp 11/27 >>  Enterobacter cloacae BAL 12/14 >> proteus (RES cipro otherwise sens)   ANTIBIOTICS: Vanc 11/26 >>11/30 Pip-tazo 11/26 >>12/1 Cipro 12/1 >> 12/8 Ceftriaxone 12/15 >>   SIGNIFICANT EVENTS: 11/26 - Intubated (endotracheally) at OSH ED   11/29 - bronch with left inspissated pus 12/14 - proteus from BAL 12/18 - Worsening resp status, increased WOB.  S/p FOB 12/14, CXR 12/17 still c complete opacification L hemithorax.    LINES/TUBES: PEG PICC 11/27  RUE >> Foley [name] 12/1 >>  DISCUSSION: 50 y/o man s/p devastating meningitis, chronic debility, p/w fever, AMS & severe L HCAP vs aspiration. This has been treated.  He is trach/PEG dependent. Mucous plugging has been most recent issue.This will likely be a chronic and recurrent issue. We did obtain a BAL on 12/14 during his FOB which grew proteus. Despite FOB, he has had continued left chest opacification worrisome for effusion.    ASSESSMENT / PLAN:   Acute resp failure/ ARDS/ L HCAP with Enterobacter cloacae - s/p abx.  Worsening resp status 12/18.  Left  Pleural Effusion, massive s/p chest tube Left lung atelectasis/collapse d/t mucous plugging  Hx of trach x2. Trached for the 3rd time on 12/1 P:   Continue  ceftriaxone for gram-negative pneumonia until 12/25 -10 ds and repeat chest x-ray then  Continue weaning efforts-hopefully we can progress to trach collar  Chest tube to waterseal   Wife had indicated on prior discussions that she would be more amenable to palliative care discussion after Christmas and we should pursue that before [facility]  [facility] and will see again next week-please call over the weekend if needed with questions  Oretha MilchALVA,RAKESH V. MD

## 2016-11-25 NOTE — Progress Notes (Signed)
PULMONARY / CRITICAL CARE MEDICINE   Name: Bonnita HollowCraig D Pettinger MRN: [id number] DOB: 10/25/1966    ADMISSION DATE:  10/14/2016  CHIEF COMPLAINT:  Nausea and Vomiting  BRIEF SUMMARY: [age] yom w/ severe neurological deficits after meningitis.  He was brought to the ED with decreased level of consciousness and nausea/vomiting/diarrhea. A CXR showed consolidation of the left upper lobe. [facility] Tracheostomy on 12/1 for prolonged ventilation   SUBJECTIVE: Remains unresponsive Was hypothermic overnight  VITAL SIGNS: BP (!) 87/59 (BP Location: Left Arm)   Pulse 97   Temp 98.7 F (37.1 C) (Axillary)   Resp (!) 23   Ht 5\' 8"  (1.727 m)   Wt 154 lb (69.9 kg)   SpO2 92%   BMI 23.42 kg/m    VENTILATOR SETTINGS: Vent Mode: CPAP;PSV FiO2 (%):  [40 %-50 %] 40 % Set Rate:  [20 bmp] 20 bmp Vt Set:  [420 mL] 420 mL PEEP:  [5 cmH20] 5 cmH20 Pressure Support:  [5 cmH20] 5 cmH20 Plateau Pressure:  [10 cmH20-16 cmH20] 16 cmH20  INTAKE / OUTPUT: I/O last 3 completed shifts: In: 4108.4 [I.V.:586.4; NG/GT:3471.9; IV Piggyback:50] Out: 2370 [Urine:2200; Chest Tube:170]  PHYSICAL EXAMINATION: General: Middle aged male, chronically ill appearing, not interactive, int agitation Neuro: eyes closed.  CN grosly intact. (-) lateralizing signs.s HEENT: perrl Cardiovascular: RRR, no M/R/G.  Lungs: reduced BS left Abdomen: BS x 4, soft, NT/ND.  Musculoskeletal: No gross deformities, no edema.  Skin: Intact, warm, no rashes.  LABS:  BMET  Recent Labs Lab 11/21/16 0442 11/23/16 0500 11/24/16 0720  NA 144 145 140  K 3.3* 3.2* 3.3*  CL 101 104 101  CO2 31 31 29   BUN 12 12 10   CREATININE 0.75 0.73 0.69  GLUCOSE 113* 107* 104*   Electrolytes  Recent Labs Lab 11/20/16 0453 11/21/16 0442 11/23/16 0500 11/24/16 0720  CALCIUM 8.7* 8.6* 8.7* 8.2*  MG 1.9  --   --   --     CBC  Recent Labs Lab 11/20/16 0453 11/23/16 0500 11/24/16 0720  WBC 12.6* 13.2*  12.4*  HGB 8.4* 7.8* 8.1*  HCT 26.9* 24.9* 25.7*  PLT 395 289 282    Coag's  Recent Labs Lab 11/22/16 1236 11/22/16 2040  APTT 48* 48*  INR 1.03 1.06    Sepsis Markers No results for input(s): LATICACIDVEN, PROCALCITON, O2SATVEN in the last 168 hours.  ABG  Recent Labs Lab 11/21/16 1213  PHART 7.452*  PCO2ART 48.1*  PO2ART 66.6*    Liver Enzymes  Recent Labs Lab 11/20/16 0453  AST 15  ALT 19  ALKPHOS 50  BILITOT 0.6  ALBUMIN 1.5*    Cardiac Enzymes No results for input(s): TROPONINI, PROBNP in the last 168 hours.  Glucose  Recent Labs Lab 11/24/16 1614 11/24/16 2035 11/25/16 0026 11/25/16 0343 11/25/16 0752 11/25/16 1214  GLUCAP 98 95 98 93 90 94    Imaging No results found.   STUDIES:  CXR 11/26 >> LUL PNA EEG 12/5 >> negative for seizure   CULTURES: Blood 11/26 >> coag neg staph 1/2  C. Diff 11/26 >> (-) Resp 11/27 >>  Enterobacter cloacae BAL 12/14 >> proteus (RES cipro otherwise sens)   ANTIBIOTICS: Vanc 11/26 >>11/30 Pip-tazo 11/26 >>12/1 Cipro 12/1 >> 12/8 Ceftriaxone 12/15 >>   SIGNIFICANT EVENTS: 11/26 - Intubated (endotracheally) at OSH ED   11/29 - bronch with left inspissated pus 12/14 - proteus from BAL 12/18 - Worsening resp status, increased WOB.  S/p  FOB 12/14, CXR 12/17 still c complete opacification L hemithorax.    LINES/TUBES: PEG PICC 11/27  RUE >> Foley [name] 12/1 >>  DISCUSSION: 50 y/o man s/p devastating meningitis, chronic debility, p/w fever, AMS & severe L HCAP vs aspiration. This has been treated.  He is trach/PEG dependent. Mucous plugging has been most recent issue.This will likely be a chronic and recurrent issue. We did obtain a BAL on 12/14 during his FOB which grew proteus. Despite FOB, he has had continued left chest opacification worrisome for effusion.    ASSESSMENT / PLAN:   Acute resp failure/ ARDS/ L HCAP with Enterobacter cloacae - s/p abx.  Worsening resp status 12/18.  R/O Left  Pleural Effusion, massive Left lung atelectasis/collapse d/t mucous plugging  Hx of trach x2. Trached for the 3rd time on 12/1 P:   Continue  ceftriaxone for gram-negative pneumonia  Continue weaning efforts with goal trach collar  Chest tube to waterseal   Oretha MilchALVA,Cassia Fein V. MD

## 2016-11-26 DIAGNOSIS — G9349 Other encephalopathy: Secondary | ICD-10-CM

## 2016-11-26 DIAGNOSIS — E43 Unspecified severe protein-calorie malnutrition: Secondary | ICD-10-CM

## 2016-11-26 DIAGNOSIS — E2740 Unspecified adrenocortical insufficiency: Secondary | ICD-10-CM

## 2016-11-26 LAB — GLUCOSE, CAPILLARY
GLUCOSE-CAPILLARY: 103 mg/dL — AB (ref 65–99)
GLUCOSE-CAPILLARY: 114 mg/dL — AB (ref 65–99)
Glucose-Capillary: 107 mg/dL — ABNORMAL HIGH (ref 65–99)
Glucose-Capillary: 85 mg/dL (ref 65–99)
Glucose-Capillary: 89 mg/dL (ref 65–99)

## 2016-11-26 NOTE — Progress Notes (Signed)
PROGRESS NOTE    Dale [name]  TBJ:[id number] DOB: 01/23/1966 DOA: 10/17/2016 PCP: No PCP Per Patient   Brief Narrative:  50 y.o. BM with PMHx Cerebral septic emboli ; Cerebral thrombosis with cerebral infarction (03/25/2016),  Convulsions/seizures  (05/04/2016); Dementia (04/27/2016); Depression; Dysphagia (05/04/2016); ETOH abuse;Tobacco abuse , Hydrocephalus Acute encephalopathy;Meningitis, Acute ischemic stroke, Cardiac arrest; Cardiogenic shock, NSTEMI,  Chronic Diastolic and Systolic CHF    Admission in March 2017 with strep pneumo meningitis c/b cardiac arrest and cardiogenic shock, ARDS requiring prolonged intubation and trach (now decannulated), and acute renal failure. He was admitted to [facility] 06/16/16 through 07/11/16 for recurrent hypoglycemia due to adrenal insufficiency, new subacute CVA, seizures with inability to protect airway s/p intubation 7/13 through 7/21. He re-presented 8/08 again with sepsis from UTI, and was intubated due to AMS, and was trached 8/18, and was discharged with a cuffless trach. That hospitalization was c/b MRSA bacteremia 8/24, and completed 3 weeks of IV vanc. He also developed C. Diff, and was treated with PO vanc through 9/11. He has had continued autonomic dysreflexia, which worsens during complications of acute illnesses.  Prior to this presentation, he had been well, although he was decannulated for reasons that are unclear. He was brought to the ED with decreased level of consciousness and nausea/vomiting/diarrhea. A CXR showed consolidation of the left upper lobe. He was intubated and transferred to [facility].  There have been significant amount of conversations with his wife with regard to goals of care, but she has insisted on full support. He is full code.   Subjective: 12/23  A/O x 0.  Mild w/d to pain    Assessment & Plan:   Active Problems:   Adrenal insufficiency (Addison's disease) (HCC)   Respiratory failure (HCC)   Renal failure   Pressure  injury of skin   Acute respiratory failure with hypoxia (HCC)   ARDS (adult respiratory distress syndrome) (HCC)   Severe sepsis (HCC)   Chronic anoxic encephalopathy (HCC)   Systolic and diastolic CHF, chronic (HCC)   Mucus plugging of bronchi   Abdominal distension   Collapse of left lung   Acute hypoxic respiratory failure secondary to Enterobacter HCAP w/ ARDS and Sepsis  Status post tracheostomy for the third time - trach care per PCCM with no plans to decannulate - sepsis and acute infection appear to have cleared - tolerating trach collar - SPO2 goal 93% -Left chest tube in place draining significant serosanguineous fluid.  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 -Mental status appears to be at his baseline   Transient Atrial fibrillation -currently NSR   Chronic Systolic and Diastolic CHF -EF 46% Aug 2017  - no evidence of volume overload at this time  -Strict in and out since admission + 6.8 L Filed Weights   11/24/16 0344 11/25/16 0500 11/26/16 0341  Weight: 72.1 kg (159 lb) 69.9 kg (154 lb) 67.1 kg (148 lb)  - weight is Relatively stable  Adrenal insufficiency  - 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   Hypernatremia -Resolved with increase in free water via tube   Hypokalemia -Potassium goal > 4   Hypomagnesemia -Magnesium goal > 2    Severe malnutrition in context of  chronic illness -Continue tube feeds per PEG   Goals of care - PALLIATIVE CARE: Review chart shows that the care family on 12/15 and family appears not wanted to engage with palliative care until after the holidays.       DVT prophylaxis: Subcutaneous heparin Code Status:  Full Family Communication: None Disposition Plan: Ready for SNF in next 24-48 hours if he stays on 28% FiO2   Consultants:  [facility]  Procedures/Significant Events:  11/26 Intubated (endotracheally) at OSH ED - transferred to [facility]  11/29 bronch - left inspissated pus   VENTILATOR SETTINGS: NA   Cultures   Antimicrobials: Vanc 11/26 > 11/29 Zosyn 11/26 > 12/1 Cipro 12/1 >12/8   Devices    LINES / TUBES:      Continuous Infusions: . amiodarone 30 mg/hr (11/26/16 0316)  . feeding supplement (VITAL AF 1.2 CAL) 1,000 mL (11/25/16 2210)     Objective: Vitals:   11/26/16 0210 11/26/16 0341 11/26/16 0700 11/26/16 0811  BP: 93/67 112/75 100/73   Pulse:  75    Resp:  (!) 22    Temp:  97.4 F (36.3 C) 97.4 F (36.3 C)   TempSrc:  Oral Oral   SpO2:  100%  100%  Weight:  67.1 kg (148 lb)    Height:        Intake/Output Summary (Last 24 hours) at 11/26/16 1131 Last data filed at 11/26/16 0500  Gross per 24 hour  Intake           2070.6 ml  Output             1665 ml  Net            405.6 ml   Filed Weights   11/24/16 0344 11/25/16 0500 11/26/16 0341  Weight: 72.1 kg (159 lb) 69.9 kg (154 lb) 67.1 kg (148 lb)    Examination:  General: No acute respiratory distress  Lungs: Clear to auscultation bilaterally Cardiovascular: Tachycardia w/o gallup or rub   Abdomen: Nondistended, soft, bowel sounds positive Extremities: Stable trace edema B LE  .     Data Reviewed: Care during the described time interval was provided by me .  I have reviewed this patient's available data, including medical history, events of note, physical examination, and all test results as part of my evaluation. I have personally reviewed and interpreted all radiology studies.  CBC:  Recent Labs Lab 11/20/16 0453 11/23/16 0500 11/24/16 0720  WBC 12.6* 13.2* 12.4*  HGB 8.4* 7.8* 8.1*  HCT 26.9* 24.9* 25.7*  MCV 91.5 94.7 91.1  PLT 395 289 992   Basic Metabolic Panel:  Recent  Labs Lab 11/20/16 0453 11/21/16 0442 11/23/16 0500 11/24/16 0720  NA 143 144 145 140  K 3.6 3.3* 3.2* 3.3*  CL 100* 101 104 101  CO2 _0 GLUCOSE 93 113* 107* 104*  BUN _1 CREATININE 0.74 0.75 0.73 0.69  CALCIUM 8.7* 8.6* 8.7* 8.2*  MG 1.9  --   --   --    GFR: Estimated Creatinine Clearance: 102.5 mL/min (by C-G formula based on SCr of 0.69 mg/dL). Liver Function Tests:  Recent Labs Lab 11/20/16 0453 11/23/16 2349  AST 15  --   ALT 19  --   ALKPHOS 50  --   BILITOT 0.6  --   PROT 6.4* 4.8*  ALBUMIN 1.5*  --    No results for input(s): LIPASE, AMYLASE in the last 168 hours. No  results for input(s): AMMONIA in the last 168 hours. Coagulation Profile:  Recent Labs Lab 11/22/16 1236 11/22/16 2040  INR 1.03 1.06   Cardiac Enzymes: No results for input(s): CKTOTAL, CKMB, CKMBINDEX, TROPONINI in the last 168 hours. BNP (last 3 results) No results for input(s): PROBNP in the last 8760 hours. HbA1C: No results for input(s): HGBA1C in the last 72 hours. CBG:  Recent Labs Lab 11/25/16 1803 11/25/16 2104 11/25/16 2348 11/26/16 0405 11/26/16 0807  GLUCAP 99 79 111* 107* 103*   Lipid Profile:  Recent Labs  11/23/16 2349  CHOL 74   Thyroid Function Tests: No results for input(s): TSH, T4TOTAL, FREET4, T3FREE, THYROIDAB in the last 72 hours. Anemia Panel: No results for input(s): VITAMINB12, FOLATE, FERRITIN, TIBC, IRON, RETICCTPCT in the last 72 hours. Urine analysis:    Component Value Date/Time   COLORURINE YELLOW 10/28/2016 1807   APPEARANCEUR CLEAR 10/19/2016 1807   LABSPEC <1.005 (L) 10/09/2016 1807   PHURINE 7.0 10/18/2016 1807   GLUCOSEU NEGATIVE 10/21/2016 1807   HGBUR LARGE (A) 10/22/2016 1807   BILIRUBINUR NEGATIVE 10/17/2016 1807   KETONESUR NEGATIVE 10/21/2016 1807   PROTEINUR 30 (A) 10/11/2016 1807   UROBILINOGEN 1.0 11/13/2011 2010   NITRITE NEGATIVE 10/26/2016 1807   LEUKOCYTESUR NEGATIVE 10/22/2016 1807   Sepsis  Labs: _0 (procalcitonin:4,lacticidven:4)  ) Recent Results (from the past 240 hour(s))  Culture, bal-quantitative     Status: Abnormal   Collection Time: 11/17/16  1:51 PM  Result Value Ref Range Status   Specimen Description BRONCHIAL ALVEOLAR LAVAGE  Final   Special Requests NONE  Final   Gram Stain   Final    FEW WBC PRESENT, PREDOMINANTLY PMN FEW GRAM POSITIVE RODS RARE GRAM POSITIVE COCCI IN CLUSTERS    Culture (A)  Final    60,000 COLONIES/mL PROTEUS MIRABILIS 30,000 COLONIES/mL GROUP B STREP(S.AGALACTIAE)ISOLATED TESTING AGAINST S. AGALACTIAE NOT ROUTINELY PERFORMED DUE TO PREDICTABILITY OF AMP/PEN/VAN SUSCEPTIBILITY.    Report Status 11/19/2016 FINAL  Final   Organism ID, Bacteria PROTEUS MIRABILIS (A)  Final      Susceptibility   Proteus mirabilis - MIC*    AMPICILLIN <=2 SENSITIVE Sensitive     CEFAZOLIN <=4 SENSITIVE Sensitive     CEFEPIME <=1 SENSITIVE Sensitive     CEFTAZIDIME <=1 SENSITIVE Sensitive     CEFTRIAXONE <=1 SENSITIVE Sensitive     CIPROFLOXACIN >=4 RESISTANT Resistant     GENTAMICIN <=1 SENSITIVE Sensitive     IMIPENEM 4 SENSITIVE Sensitive     TRIMETH/SULFA <=20 SENSITIVE Sensitive     AMPICILLIN/SULBACTAM <=2 SENSITIVE Sensitive     PIP/TAZO <=4 SENSITIVE Sensitive     * 60,000 COLONIES/mL PROTEUS MIRABILIS  Culture, blood (routine x 2)     Status: None (Preliminary result)   Collection Time: 11/22/16 12:35 PM  Result Value Ref Range Status   Specimen Description BLOOD LEFT ARM  Final   Special Requests BOTTLES DRAWN AEROBIC ONLY 5CC  Final   Culture NO GROWTH 3 DAYS  Final   Report Status PENDING  Incomplete  Culture, blood (routine x 2)     Status: None (Preliminary result)   Collection Time: 11/22/16 12:40 PM  Result Value Ref Range Status   Specimen Description BLOOD LEFT ASSIST CONTROL  Final   Special Requests BOTTLES DRAWN AEROBIC ONLY 5CC  Final   Culture NO GROWTH 3 DAYS  Final   Report Status PENDING  Incomplete  Body  fluid culture     Status: None (Preliminary result)   Collection  Time: 11/23/16  5:29 PM  Result Value Ref Range Status   Specimen Description FLUID PLEURAL LEFT  Final   Special Requests Normal  Final   Gram Stain NO WBC SEEN NO ORGANISMS SEEN   Final   Culture NO GROWTH 3 DAYS  Final   Report Status PENDING  Incomplete         Radiology Studies: No results found.      Scheduled Meds: . atorvastatin  20 mg Per Tube q1800  . budesonide (PULMICORT) nebulizer solution  0.5 mg Nebulization BID  . chlorhexidine gluconate (MEDLINE KIT)  15 mL Mouth Rinse BID  . clonazePAM  0.25 mg Per Tube Q6H  . famotidine  20 mg Per Tube BID  . fludrocortisone  0.1 mg Per Tube Daily  . folic acid  1 mg Per Tube Daily  . free water  200 mL Per Tube Q4H  . heparin  5,000 Units Subcutaneous Q8H  . hydrocortisone  10 mg Per Tube QHS  . hydrocortisone  5 mg Per Tube Daily  . ipratropium  0.5 mg Nebulization QID  . levalbuterol  0.63 mg Nebulization QID  . levETIRAcetam  1,500 mg Per Tube BID  . mouth rinse  15 mL Mouth Rinse QID  . metoprolol  10 mg Intravenous Q6H  . QUEtiapine  50 mg Per Tube BID  . thiamine  100 mg Per Tube Daily  . Valproate Sodium  750 mg Per Tube Q8H   Continuous Infusions: . amiodarone 30 mg/hr (11/26/16 0316)  . feeding supplement (VITAL AF 1.2 CAL) 1,000 mL (11/25/16 2210)     LOS: 27 days    Time spent: 40 minutes    Emalee Knies, Geraldo Docker, MD [facility] Pager [phone]   If 7PM-7AM, please contact night-coverage [email or web address] Password [facility] 11/26/2016, 11:31 AM

## 2016-11-26 NOTE — Progress Notes (Signed)
PULMONARY / CRITICAL CARE MEDICINE   Name: Dale HollowCraig D Galas MRN: [id number] DOB: 07/30/1966    ADMISSION DATE:  10/24/2016  CHIEF COMPLAINT:  Nausea and Vomiting  BRIEF SUMMARY: [age] yom w/ severe neurological deficits after meningitis.  He was brought to the ED with decreased level of consciousness and nausea/vomiting/diarrhea. A CXR showed consolidation of the left upper lobe. He was intubated and transferred to [facility]. Tracheostomy on 12/1 for prolonged ventilation   SUBJECTIVE: Remains unresponsive Weaning well today on pressure support 5/5 - has not tried t bar yet per charge nurse   VITAL SIGNS: BP 100/73 (BP Location: Left Arm)   Pulse 75   Temp 97.4 F (36.3 C) (Oral)   Resp (!) 22   Ht 5\' 8"  (1.727 m)   Wt 148 lb (67.1 kg)   SpO2 100%   BMI 22.50 kg/m    VENTILATOR SETTINGS: Vent Mode: PRVC FiO2 (%):  [30 %-40 %] 40 % Set Rate:  [20 bmp] 20 bmp Vt Set:  [420 mL] 420 mL PEEP:  [5 cmH20] 5 cmH20 Pressure Support:  [5 cmH20] 5 cmH20 Plateau Pressure:  [15 cmH20-20 cmH20] 20 cmH20  INTAKE / OUTPUT: I/O last 3 completed shifts: In: 3814.4 [I.V.:534.4; NG/GT:3280] Out: 4325 [Urine:4175; Chest Tube:150]  PHYSICAL EXAMINATION: General: Middle aged male, chronically ill appearing, not interactive  Neuro:    Does not follow commands HEENT: orophx clear  Cardiovascular: RRR, no M/R/G.  Lungs: reduced BS bases L > R  Abdomen: BS x 4, soft, NT/ND.  Musculoskeletal: No gross deformities, no edema.  Skin: Intact, warm, no rashes.  LABS:  BMET  Recent Labs Lab 11/21/16 0442 11/23/16 0500 11/24/16 0720  NA 144 145 140  K 3.3* 3.2* 3.3*  CL 101 104 101  CO2 31 31 29   BUN 12 12 10   CREATININE 0.75 0.73 0.69  GLUCOSE 113* 107* 104*   Electrolytes  Recent Labs Lab 11/20/16 0453 11/21/16 0442 11/23/16 0500 11/24/16 0720  CALCIUM 8.7* 8.6* 8.7* 8.2*  MG 1.9  --   --   --     CBC  Recent Labs Lab 11/20/16 0453 11/23/16 0500 11/24/16 0720  WBC 12.6*  13.2* 12.4*  HGB 8.4* 7.8* 8.1*  HCT 26.9* 24.9* 25.7*  PLT 395 289 282    Coag's  Recent Labs Lab 11/22/16 1236 11/22/16 2040  APTT 48* 48*  INR 1.03 1.06    Sepsis Markers No results for input(s): LATICACIDVEN, PROCALCITON, O2SATVEN in the last 168 hours.  ABG  Recent Labs Lab 11/21/16 1213  PHART 7.452*  PCO2ART 48.1*  PO2ART 66.6*    Liver Enzymes  Recent Labs Lab 11/20/16 0453  AST 15  ALT 19  ALKPHOS 50  BILITOT 0.6  ALBUMIN 1.5*    Cardiac Enzymes No results for input(s): TROPONINI, PROBNP in the last 168 hours.  Glucose  Recent Labs Lab 11/25/16 1214 11/25/16 1803 11/25/16 2104 11/25/16 2348 11/26/16 0405 11/26/16 0807  GLUCAP 94 99 79 111* 107* 103*    Imaging No results found.   STUDIES:  CXR 11/26 >> LUL PNA EEG 12/5 >> negative for seizure   CULTURES: Blood 11/26 >> coag neg staph 1/2  C. Diff 11/26 >> (-) Resp 11/27 >>  Enterobacter cloacae BAL 12/14 >> proteus (RES cipro otherwise sens)   ANTIBIOTICS: Vanc 11/26 >>11/30 Pip-tazo 11/26 >>12/1 Cipro 12/1 >> 12/8 Ceftriaxone 12/15 > 12/22   SIGNIFICANT EVENTS: 11/26 - Intubated (endotracheally) at [facility] ED   11/29 - bronch with left  inspissated pus 12/14 - proteus from BAL 12/18 - Worsening resp status, increased WOB.  S/p FOB 12/14, CXR 12/17 still c complete opacification L hemithorax.    LINES/TUBES: PEG PICC 11/27  RUE >> Foley [name] 12/1 >>  DISCUSSION: 50 y/o man s/p devastating meningitis, chronic debility, p/w fever, AMS & severe L HCAP vs aspiration. This has been treated.  He is trach/PEG dependent. Mucous plugging has been most recent issue.This will likely be a chronic and recurrent issue. We did obtain a BAL on 12/14 during his FOB which grew proteus and cxr on 12/20 showed improvement in LLL aeration     ASSESSMENT / PLAN:   Acute resp failure/ ARDS/ L HCAP with Enterobacter cloacae - s/p abx.  Worsening resp status 12/18.  Left Pleural  Effusion, massive s/p chest tube > improved  Left lung atelectasis/collapse d/t mucous plugging > improved  Hx of trach x2. Trached for the 3rd time on 12/1    Overall some better but overall prognosis for functional recovery seems nil   Continue weaning efforts-hopefully we can progress to trach collar > discussed with charge nurse  Chest tube to waterseal   Nothing else to offer - we will see him again on 12/26 - call in  Meantime if needed   Sandrea HughsMichael Tayjon Halladay, MD Pulmonary and Critical Care Medicine [facility] Cell [phone] After 5:30 PM or weekends, call [phone]

## 2016-11-27 LAB — CULTURE, BLOOD (ROUTINE X 2)
Culture: NO GROWTH
Culture: NO GROWTH

## 2016-11-27 LAB — BODY FLUID CULTURE
CULTURE: NO GROWTH
Gram Stain: NONE SEEN
Special Requests: NORMAL

## 2016-11-27 LAB — GLUCOSE, CAPILLARY
GLUCOSE-CAPILLARY: 95 mg/dL (ref 65–99)
Glucose-Capillary: 103 mg/dL — ABNORMAL HIGH (ref 65–99)
Glucose-Capillary: 106 mg/dL — ABNORMAL HIGH (ref 65–99)
Glucose-Capillary: 85 mg/dL (ref 65–99)
Glucose-Capillary: 86 mg/dL (ref 65–99)
Glucose-Capillary: 92 mg/dL (ref 65–99)
Glucose-Capillary: 95 mg/dL (ref 65–99)

## 2016-11-27 LAB — CHOLESTEROL, BODY FLUID: CHOL FL: 53 mg/dL

## 2016-11-27 NOTE — Progress Notes (Signed)
PROGRESS NOTE    Dale [name]  MRN:[id number] DOB: 03/28/1966 DOA: 10/20/2016 PCP: No PCP Per Patient   Brief Narrative:  50 y.o. BM with PMHx Cerebral septic emboli ; Cerebral thrombosis with cerebral infarction (03/25/2016),  Convulsions/seizures  (05/04/2016); Dementia (04/27/2016); Depression; Dysphagia (05/04/2016); ETOH abuse;Tobacco abuse , Hydrocephalus Acute encephalopathy;Meningitis, Acute ischemic stroke, Cardiac arrest; Cardiogenic shock, NSTEMI,  Chronic Diastolic and Systolic CHF    Admission in March 2017 with strep pneumo meningitis c/b cardiac arrest and cardiogenic shock, ARDS requiring prolonged intubation and trach (now decannulated), and acute renal failure. He was admitted to [facility] 06/16/16 through 07/11/16 for recurrent hypoglycemia due to adrenal insufficiency, new subacute CVA, seizures with inability to protect airway s/p intubation 7/13 through 7/21. He re-presented 8/08 again with sepsis from UTI, and was intubated due to AMS, and was trached 8/18, and was discharged with a cuffless trach. That hospitalization was c/b MRSA bacteremia 8/24, and completed 3 weeks of IV vanc. He also developed C. Diff, and was treated with PO vanc through 9/11. He has had continued autonomic dysreflexia, which worsens during complications of acute illnesses.  Prior to this presentation, he had been well, although he was decannulated for reasons that are unclear. He was brought to the ED with decreased level of consciousness and nausea/vomiting/diarrhea. A CXR showed consolidation of the left upper lobe. He was intubated and transferred to [facility].  There have been significant amount of conversations with his wife with regard to goals of care, but she has insisted on full support. He is full code.   Subjective: 12/24  A/O x 0.  Mild w/d to pain open eyes when name called   Assessment & Plan:   Active Problems:   Adrenal insufficiency (Addison's disease) (HCC)   Respiratory failure (HCC)   Renal  failure   Pressure injury of skin   Acute respiratory failure with hypoxia (HCC)   ARDS (adult respiratory distress syndrome) (HCC)   Severe sepsis (HCC)   Chronic anoxic encephalopathy (HCC)   Systolic and diastolic CHF, chronic (HCC)   Mucus plugging of bronchi   Abdominal distension   Collapse of left lung   Encephalopathy chronic   Adrenal insufficiency (HCC)   Severe malnutrition (HCC)   Acute hypoxic respiratory failure secondary to Enterobacter HCAP w/ ARDS and Sepsis  Status post tracheostomy for the third time - trach care per PCCM with no plans to decannulate - sepsis and acute infection appear to have cleared - tolerating trach collar - SPO2 goal 93% -Left chest tube in place draining significant serosanguineous fluid.  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 -Mental status appears to be at his baseline   Transient Atrial fibrillation -currently NSR   Chronic Systolic and Diastolic CHF -EF 40% Aug 2017  - no evidence of volume overload at this time  -Strict in and out since admission + 5.3 L Filed Weights   11/25/16 0500 11/26/16 0341 11/27/16 0310  Weight: 69.9 kg (154 lb) 67.1 kg (148 lb) 66.7 kg (147 lb)  - weight is Relatively stable  Adrenal insufficiency  - 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   Hypernatremia -Resolved with increase in free water via tube   Hypokalemia -Potassium   goal > 4   Hypomagnesemia -Magnesium goal > 2    Severe malnutrition in context of chronic illness -Continue tube feeds per PEG   Goals of care - PALLIATIVE CARE: Review chart shows that the care family on 12/15 and family appears not wanted to engage with  palliative care until after the holidays.       DVT prophylaxis: Subcutaneous heparin Code Status: Full Family Communication: None Disposition Plan: Ready for SNF When chest tube discontinued   Consultants:  PCC M  Procedures/Significant Events:  11/26 Intubated (endotracheally) at [facility] ED - transferred to [facility]  11/29 bronch - left inspissated pus   VENTILATOR SETTINGS: NA   Cultures   Antimicrobials: Vanc 11/26 > 11/29 Zosyn 11/26 > 12/1 Cipro 12/1 >12/8   Devices    LINES / TUBES:      Continuous Infusions: . amiodarone 30 mg/hr (11/27/16 0135)  . feeding supplement (VITAL AF 1.2 CAL) 1,000 mL (11/26/16 1639)     Objective: Vitals:   11/27/16 0520 11/27/16 0600 11/27/16 0749 11/27/16 0808  BP: 137/90 (!) 135/94  (!) 143/95  Pulse:      Resp:      Temp:    97.3 F (36.3 C)  TempSrc:    Oral  SpO2:   100%   Weight:      Height:        Intake/Output Summary (Last 24 hours) at 11/27/16 0850 Last data filed at 11/27/16 0600  Gross per 24 hour  Intake           1580.4 ml  Output             1830 ml  Net           -249.6 ml   Filed Weights   11/25/16 0500 11/26/16 0341 11/27/16 0310  Weight: 69.9 kg (154 lb) 67.1 kg (148 lb) 66.7 kg (147 lb)    Examination:  General: No acute respiratory distress  Lungs: Clear to auscultation bilaterally Cardiovascular: Tachycardia w/o gallup or rub   Abdomen: Nondistended, soft, bowel sounds positive Extremities: Stable trace edema B LE  .     Data Reviewed: Care during the described time interval was provided by me .  I have reviewed this patient's available data, including medical history, events of note, physical examination, and all test results as part of my evaluation. I have personally reviewed and interpreted all radiology studies.  CBC:  Recent Labs Lab 11/23/16 0500 11/24/16 0720  WBC 13.2* 12.4*  HGB 7.8* 8.1*  HCT 24.9* 25.7*  MCV 94.7 91.1  PLT 289 282   Basic Metabolic  Panel:  Recent Labs Lab 11/21/16 0442 11/23/16 0500 11/24/16 0720  NA 144 145 140  K 3.3* 3.2* 3.3*  CL 101 104 101  CO2 31 31 29  GLUCOSE 113* 107* 104*  BUN 12 12 10  CREATININE 0.75 0.73 0.69  CALCIUM 8.6* 8.7* 8.2*   GFR: Estimated Creatinine Clearance: 101.9 mL/min (by C-G formula based on SCr of 0.69 mg/dL). Liver Function Tests:  Recent Labs Lab 11/23/16 2349  PROT 4.8*   No results for input(s): LIPASE, AMYLASE in the last 168 hours. No results for input(s): AMMONIA in the last 168 hours. Coagulation Profile:  Recent Labs Lab 11/22/16 1236 11/22/16 2040  INR 1.03 1.06   Cardiac Enzymes: No results for input(s): CKTOTAL, CKMB, CKMBINDEX, TROPONINI in the last 168 hours. BNP (last 3 results) No results for input(s): PROBNP in the last 8760 hours. HbA1C: No   results for input(s): HGBA1C in the last 72 hours. CBG:  Recent Labs Lab 11/26/16 1618 11/26/16 2044 11/27/16 0014 11/27/16 0409 11/27/16 0807  GLUCAP 85 89 95 95 92   Lipid Profile: No results for input(s): CHOL, HDL, LDLCALC, TRIG, CHOLHDL, LDLDIRECT in the last 72 hours. Thyroid Function Tests: No results for input(s): TSH, T4TOTAL, FREET4, T3FREE, THYROIDAB in the last 72 hours. Anemia Panel: No results for input(s): VITAMINB12, FOLATE, FERRITIN, TIBC, IRON, RETICCTPCT in the last 72 hours. Urine analysis:    Component Value Date/Time   COLORURINE YELLOW 10/16/2016 1807   APPEARANCEUR CLEAR 10/13/2016 1807   LABSPEC <1.005 (L) 10/29/2016 1807   PHURINE 7.0 10/27/2016 1807   GLUCOSEU NEGATIVE 10/09/2016 1807   HGBUR LARGE (A) 10/24/2016 1807   BILIRUBINUR NEGATIVE 10/29/2016 1807   KETONESUR NEGATIVE 10/29/2016 1807   PROTEINUR 30 (A) 10/19/2016 1807   UROBILINOGEN 1.0 11/13/2011 2010   NITRITE NEGATIVE 10/06/2016 1807   LEUKOCYTESUR NEGATIVE 10/07/2016 1807   Sepsis Labs: @LABRCNTIP(procalcitonin:4,lacticidven:4)  ) Recent Results (from the past 240 hour(s))  Culture,  bal-quantitative     Status: Abnormal   Collection Time: 11/17/16  1:51 PM  Result Value Ref Range Status   Specimen Description BRONCHIAL ALVEOLAR LAVAGE  Final   Special Requests NONE  Final   Gram Stain   Final    FEW WBC PRESENT, PREDOMINANTLY PMN FEW GRAM POSITIVE RODS RARE GRAM POSITIVE COCCI IN CLUSTERS    Culture (A)  Final    60,000 COLONIES/mL PROTEUS MIRABILIS 30,000 COLONIES/mL GROUP B STREP(S.AGALACTIAE)ISOLATED TESTING AGAINST S. AGALACTIAE NOT ROUTINELY PERFORMED DUE TO PREDICTABILITY OF AMP/PEN/VAN SUSCEPTIBILITY.    Report Status 11/19/2016 FINAL  Final   Organism ID, Bacteria PROTEUS MIRABILIS (A)  Final      Susceptibility   Proteus mirabilis - MIC*    AMPICILLIN <=2 SENSITIVE Sensitive     CEFAZOLIN <=4 SENSITIVE Sensitive     CEFEPIME <=1 SENSITIVE Sensitive     CEFTAZIDIME <=1 SENSITIVE Sensitive     CEFTRIAXONE <=1 SENSITIVE Sensitive     CIPROFLOXACIN >=4 RESISTANT Resistant     GENTAMICIN <=1 SENSITIVE Sensitive     IMIPENEM 4 SENSITIVE Sensitive     TRIMETH/SULFA <=20 SENSITIVE Sensitive     AMPICILLIN/SULBACTAM <=2 SENSITIVE Sensitive     PIP/TAZO <=4 SENSITIVE Sensitive     * 60,000 COLONIES/mL PROTEUS MIRABILIS  Culture, blood (routine x 2)     Status: None   Collection Time: 11/22/16 12:35 PM  Result Value Ref Range Status   Specimen Description BLOOD LEFT ARM  Final   Special Requests BOTTLES DRAWN AEROBIC ONLY 5CC  Final   Culture NO GROWTH 5 DAYS  Final   Report Status 11/27/2016 FINAL  Final  Culture, blood (routine x 2)     Status: None   Collection Time: 11/22/16 12:40 PM  Result Value Ref Range Status   Specimen Description BLOOD LEFT ASSIST CONTROL  Final   Special Requests BOTTLES DRAWN AEROBIC ONLY 5CC  Final   Culture NO GROWTH 5 DAYS  Final   Report Status 11/27/2016 FINAL  Final  Body fluid culture     Status: None (Preliminary result)   Collection Time: 11/23/16  5:29 PM  Result Value Ref Range Status   Specimen Description  FLUID PLEURAL LEFT  Final   Special Requests Normal  Final   Gram Stain NO WBC SEEN NO ORGANISMS SEEN   Final   Culture NO GROWTH 3 DAYS  Final   Report Status PENDING    Incomplete         Radiology Studies: No results found.      Scheduled Meds: . atorvastatin  20 mg Per Tube q1800  . budesonide (PULMICORT) nebulizer solution  0.5 mg Nebulization BID  . chlorhexidine gluconate (MEDLINE KIT)  15 mL Mouth Rinse BID  . clonazePAM  0.25 mg Per Tube Q6H  . famotidine  20 mg Per Tube BID  . fludrocortisone  0.1 mg Per Tube Daily  . folic acid  1 mg Per Tube Daily  . free water  200 mL Per Tube Q4H  . heparin  5,000 Units Subcutaneous Q8H  . hydrocortisone  10 mg Per Tube QHS  . hydrocortisone  5 mg Per Tube Daily  . ipratropium  0.5 mg Nebulization QID  . levalbuterol  0.63 mg Nebulization QID  . levETIRAcetam  1,500 mg Per Tube BID  . mouth rinse  15 mL Mouth Rinse QID  . metoprolol  10 mg Intravenous Q6H  . QUEtiapine  50 mg Per Tube BID  . thiamine  100 mg Per Tube Daily  . Valproate Sodium  750 mg Per Tube Q8H   Continuous Infusions: . amiodarone 30 mg/hr (11/27/16 0135)  . feeding supplement (VITAL AF 1.2 CAL) 1,000 mL (11/26/16 1639)     LOS: 28 days    Time spent: 40 minutes    Carmisha Larusso, Geraldo Docker, MD [facility] Pager [phone]   If 7PM-7AM, please contact night-coverage [email or web address] Password [email or web address] 11/27/2016, 8:50 AM

## 2016-11-27 NOTE — Plan of Care (Signed)
Problem: Coping: Goal: Level of anxiety will decrease Outcome: Progressing Pt's level of anxiety was decreased today in comparison to the reported level. Pt was observed resting quietly with eyes closed. Able to arouse pt

## 2016-11-27 NOTE — Plan of Care (Signed)
Problem: Education: Goal: Knowledge of [facility] Education information/materials will improve Outcome: Progressing Plan of care done with NT and RN for patient with no teach back displayed at this time.

## 2016-11-28 LAB — GLUCOSE, CAPILLARY
GLUCOSE-CAPILLARY: 105 mg/dL — AB (ref 65–99)
GLUCOSE-CAPILLARY: 85 mg/dL (ref 65–99)
GLUCOSE-CAPILLARY: 97 mg/dL (ref 65–99)
Glucose-Capillary: 119 mg/dL — ABNORMAL HIGH (ref 65–99)
Glucose-Capillary: 97 mg/dL (ref 65–99)
Glucose-Capillary: 106 mg/dL — ABNORMAL HIGH (ref 65–99)

## 2016-11-28 LAB — BASIC METABOLIC PANEL
Anion gap: 5 (ref 5–15)
BUN: 10 mg/dL (ref 6–20)
CALCIUM: 8.4 mg/dL — AB (ref 8.9–10.3)
CHLORIDE: 101 mmol/L (ref 101–111)
CO2: 39 mmol/L — AB (ref 22–32)
CREATININE: 0.58 mg/dL — AB (ref 0.61–1.24)
GFR calc Af Amer: >60 mL/min (ref >60)
GFR calc non Af Amer: >60 mL/min (ref >60)
GLUCOSE: 89 mg/dL (ref 65–99)
Potassium: 2.9 mmol/L — ABNORMAL LOW (ref 3.5–5.1)
Sodium: 145 mmol/L (ref 135–145)

## 2016-11-28 LAB — MAGNESIUM: Magnesium: 1.7 mg/dL (ref 1.7–2.4)

## 2016-11-28 MED ORDER — POTASSIUM CHLORIDE 20 MEQ/15ML (10%) PO SOLN
60.0000 meq | Freq: Once | ORAL | Status: AC
  Administered 2016-11-28: 60 meq
  Filled 2016-11-28: qty 45

## 2016-11-28 MED ORDER — MAGNESIUM OXIDE 400 (241.3 MG) MG PO TABS
400.0000 mg | ORAL_TABLET | Freq: Once | ORAL | Status: AC
  Administered 2016-11-28: 400 mg
  Filled 2016-11-28: qty 1

## 2016-11-28 NOTE — Plan of Care (Signed)
Problem: Activity: Goal: Ability to tolerate increased activity will improve Outcome: Progressing Performed ROM exercises with bilateral lower extremities with no teach back displayed at this time.

## 2016-11-28 NOTE — Progress Notes (Signed)
PROGRESS NOTE    SINAI MAHANY  XIP:[id number] DOB: 18-Sep-1966 DOA: 10/10/2016 PCP: No PCP Per Patient   Brief Narrative:  49 y.o. BM with PMHx Cerebral septic emboli ; Cerebral thrombosis with cerebral infarction (03/25/2016),  Convulsions/seizures  (05/04/2016); Dementia (04/27/2016); Depression; Dysphagia (05/04/2016); ETOH abuse;Tobacco abuse , Hydrocephalus Acute encephalopathy;Meningitis, Acute ischemic stroke, Cardiac arrest; Cardiogenic shock, NSTEMI,  Chronic Diastolic and Systolic CHF    Admission in March 2017 with strep pneumo meningitis c/b cardiac arrest and cardiogenic shock, ARDS requiring prolonged intubation and trach (now decannulated), and acute renal failure. He was admitted to [facility] 06/16/16 through 07/11/16 for recurrent hypoglycemia due to adrenal insufficiency, new subacute CVA, seizures with inability to protect airway s/p intubation 7/13 through 7/21. He re-presented 8/08 again with sepsis from UTI, and was intubated due to AMS, and was trached 8/18, and was discharged with a cuffless trach. That hospitalization was c/b MRSA bacteremia 8/24, and completed 3 weeks of IV vanc. He also developed C. Diff, and was treated with PO vanc through 9/11. He has had continued autonomic dysreflexia, which worsens during complications of acute illnesses.  Prior to this presentation, he had been well, although he was decannulated for reasons that are unclear. He was brought to the ED with decreased level of consciousness and nausea/vomiting/diarrhea. A CXR showed consolidation of the left upper lobe. He was intubated and transferred to [facility].  There have been significant amount of conversations with his wife with regard to goals of care, but she has insisted on full support. He is full code.   Subjective: 12/25  A/O x 0.  Mild w/d to pain open eyes when name called   Assessment & Plan:   Active Problems:   Adrenal insufficiency (Addison's disease) ([place])   Respiratory failure (HCC)   Renal  failure   Pressure injury of skin   Acute respiratory failure with hypoxia (HCC)   ARDS (adult respiratory distress syndrome) (HCC)   Severe sepsis (HCC)   Chronic anoxic encephalopathy (HCC)   Systolic and diastolic CHF, chronic (HCC)   Mucus plugging of bronchi   Abdominal distension   Collapse of left lung   Encephalopathy chronic   Adrenal insufficiency (HCC)   Severe malnutrition (HCC)   Acute hypoxic respiratory failure secondary to Enterobacter HCAP w/ ARDS and Sepsis  Status post tracheostomy for the third time - trach care per PCCM with no plans to decannulate - sepsis and acute infection appear to have cleared - tolerating trach collar - SPO2 goal 93% -Left chest tube in place draining significant serosanguineous fluid.  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 -Mental status appears to be at his baseline   Transient Atrial fibrillation -currently NSR   Chronic Systolic and Diastolic CHF -EF 67% Aug 3419  - no evidence of volume overload at this time  -Strict in and out since admission + 7.1 L Filed Weights   11/26/16 0341 11/27/16 0310 11/28/16 0411  Weight: 67.1 kg (148 lb) 66.7 kg (147 lb) 68.9 kg (152 lb)  - weight is Relatively stable  Adrenal insufficiency  - 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   Hypernatremia -Resolved with increase in free water via tube   Hypokalemia -Potassium  goal > 4  -Potassium 60 mEq 1  Hypomagnesemia -Magnesium goal > 2  -Magnesium oxide 400 mg 1   Severe malnutrition in context of chronic illness -Continue tube feeds per PEG   Goals of care - PALLIATIVE CARE: Review chart shows that the care family on  12/15 and family appears not wanted to engage with palliative care until after the holidays.       DVT prophylaxis: Subcutaneous heparin Code Status: Full Family Communication: None Disposition Plan: Ready for SNF When chest tube discontinued   Consultants:  [facility]  Procedures/Significant Events:  11/26 Intubated (endotracheally) at [facility] ED - transferred to [facility]  11/29 bronch - left inspissated pus   VENTILATOR SETTINGS: NA   Cultures   Antimicrobials: Vanc 11/26 > 11/29 Zosyn 11/26 > 12/1 Cipro 12/1 >12/8   Devices    LINES / TUBES:      Continuous Infusions: . amiodarone 30 mg/hr (11/28/16 1457)  . feeding supplement (VITAL AF 1.2 CAL) 1,000 mL (11/28/16 0500)     Objective: Vitals:   11/28/16 1100 11/28/16 1142 11/28/16 1145 11/28/16 1510  BP:    (!) 135/96  Pulse:      Resp:      Temp: 97.5 F (36.4 C)   97.6 F (36.4 C)  TempSrc: Oral   Oral  SpO2:  100% 99%   Weight:      Height:        Intake/Output Summary (Last 24 hours) at 11/28/16 1514 Last data filed at 11/28/16 1509  Gross per 24 hour  Intake          3749.44 ml  Output             1475 ml  Net          2274.44 ml   Filed Weights   11/26/16 0341 11/27/16 0310 11/28/16 0411  Weight: 67.1 kg (148 lb) 66.7 kg (147 lb) 68.9 kg (152 lb)    Examination:  General: No acute respiratory distress  Lungs: Clear to auscultation bilaterally Cardiovascular: Tachycardia w/o gallup or rub   Abdomen: Nondistended, soft, bowel sounds positive Extremities: Stable trace edema B LE  .     Data Reviewed: Care during the described time interval was provided by me .  I have reviewed this patient's available data, including medical history, events of note, physical examination, and all test results as part of my evaluation. I have personally reviewed and interpreted all radiology studies.  CBC:  Recent Labs Lab 11/23/16 0500 11/24/16 0720  WBC 13.2* 12.4*  HGB 7.8* 8.1*  HCT 24.9*  25.7*  MCV 94.7 91.1  PLT 289 696   Basic Metabolic Panel:  Recent Labs Lab 11/23/16 0500 11/24/16 0720 11/28/16 0447  NA 145 140 145  K 3.2* 3.3* 2.9*  CL 104 101 101  CO2 31 29 39*  GLUCOSE 107* 104* 89  BUN _0 CREATININE 0.73 0.69 0.58*  CALCIUM 8.7* 8.2* 8.4*  MG  --   --  1.7   GFR: Estimated Creatinine Clearance: 104.5 mL/min (by C-G formula based on SCr of 0.58 mg/dL (L)). Liver Function Tests:  Recent Labs Lab 11/23/16 2349  PROT 4.8*   No results for input(s): LIPASE, AMYLASE in the last 168 hours. No results for input(s): AMMONIA in the last 168 hours. Coagulation Profile:  Recent Labs Lab 11/22/16 1236 11/22/16 2040  INR 1.03 1.06   Cardiac Enzymes: No results for input(s): CKTOTAL, CKMB, CKMBINDEX, TROPONINI in the  last 168 hours. BNP (last 3 results) No results for input(s): PROBNP in the last 8760 hours. HbA1C: No results for input(s): HGBA1C in the last 72 hours. CBG:  Recent Labs Lab 11/27/16 2029 11/27/16 2338 11/28/16 0336 11/28/16 0828 11/28/16 1258  GLUCAP 85 106* 119* 97 106*   Lipid Profile: No results for input(s): CHOL, HDL, LDLCALC, TRIG, CHOLHDL, LDLDIRECT in the last 72 hours. Thyroid Function Tests: No results for input(s): TSH, T4TOTAL, FREET4, T3FREE, THYROIDAB in the last 72 hours. Anemia Panel: No results for input(s): VITAMINB12, FOLATE, FERRITIN, TIBC, IRON, RETICCTPCT in the last 72 hours. Urine analysis:    Component Value Date/Time   COLORURINE YELLOW 10/05/2016 1807   APPEARANCEUR CLEAR 10/21/2016 1807   LABSPEC <1.005 (L) 10/11/2016 1807   PHURINE 7.0 10/13/2016 1807   GLUCOSEU NEGATIVE 10/28/2016 1807   HGBUR LARGE (A) 10/18/2016 1807   BILIRUBINUR NEGATIVE 10/09/2016 1807   KETONESUR NEGATIVE 10/12/2016 1807   PROTEINUR 30 (A) 10/19/2016 1807   UROBILINOGEN 1.0 11/13/2011 2010   NITRITE NEGATIVE 10/06/2016 1807   LEUKOCYTESUR NEGATIVE 10/24/2016 1807   Sepsis  Labs: _0 (procalcitonin:4,lacticidven:4)  ) Recent Results (from the past 240 hour(s))  Culture, blood (routine x 2)     Status: None   Collection Time: 11/22/16 12:35 PM  Result Value Ref Range Status   Specimen Description BLOOD LEFT ARM  Final   Special Requests BOTTLES DRAWN AEROBIC ONLY 5CC  Final   Culture NO GROWTH 5 DAYS  Final   Report Status 11/27/2016 FINAL  Final  Culture, blood (routine x 2)     Status: None   Collection Time: 11/22/16 12:40 PM  Result Value Ref Range Status   Specimen Description BLOOD LEFT ASSIST CONTROL  Final   Special Requests BOTTLES DRAWN AEROBIC ONLY 5CC  Final   Culture NO GROWTH 5 DAYS  Final   Report Status 11/27/2016 FINAL  Final  Body fluid culture     Status: None   Collection Time: 11/23/16  5:29 PM  Result Value Ref Range Status   Specimen Description FLUID PLEURAL LEFT  Final   Special Requests Normal  Final   Gram Stain NO WBC SEEN NO ORGANISMS SEEN   Final   Culture NO GROWTH 3 DAYS  Final   Report Status 11/27/2016 FINAL  Final         Radiology Studies: No results found.      Scheduled Meds: . atorvastatin  20 mg Per Tube q1800  . budesonide (PULMICORT) nebulizer solution  0.5 mg Nebulization BID  . chlorhexidine gluconate (MEDLINE KIT)  15 mL Mouth Rinse BID  . clonazePAM  0.25 mg Per Tube Q6H  . famotidine  20 mg Per Tube BID  . fludrocortisone  0.1 mg Per Tube Daily  . folic acid  1 mg Per Tube Daily  . free water  200 mL Per Tube Q4H  . heparin  5,000 Units Subcutaneous Q8H  . hydrocortisone  10 mg Per Tube QHS  . hydrocortisone  5 mg Per Tube Daily  . ipratropium  0.5 mg Nebulization QID  . levalbuterol  0.63 mg Nebulization QID  . levETIRAcetam  1,500 mg Per Tube BID  . mouth rinse  15 mL Mouth Rinse QID  . metoprolol  10 mg Intravenous Q6H  . QUEtiapine  50 mg Per Tube BID  . thiamine  100 mg Per Tube Daily  . Valproate Sodium  750 mg Per Tube Q8H   Continuous Infusions: . amiodarone 30  mg/hr (  11/28/16 1457)  . feeding supplement (VITAL AF 1.2 CAL) 1,000 mL (11/28/16 0500)     LOS: 29 days    Time spent: 40 minutes    WOODS, Geraldo Docker, MD [facility] Pager [phone]   If 7PM-7AM, please contact night-coverage [email or web address] Password [facility] 11/28/2016, 3:14 PM

## 2016-11-29 LAB — BASIC METABOLIC PANEL
Anion gap: 13 (ref 5–15)
BUN: 11 mg/dL (ref 6–20)
CHLORIDE: 101 mmol/L (ref 101–111)
CO2: 30 mmol/L (ref 22–32)
CREATININE: 0.65 mg/dL (ref 0.61–1.24)
Calcium: 8.8 mg/dL — ABNORMAL LOW (ref 8.9–10.3)
GFR calc Af Amer: >60 mL/min (ref >60)
GFR calc non Af Amer: >60 mL/min (ref >60)
GLUCOSE: 76 mg/dL (ref 65–99)
POTASSIUM: 3.8 mmol/L (ref 3.5–5.1)
Sodium: 144 mmol/L (ref 135–145)

## 2016-11-29 LAB — GLUCOSE, CAPILLARY
GLUCOSE-CAPILLARY: 84 mg/dL (ref 65–99)
GLUCOSE-CAPILLARY: 95 mg/dL (ref 65–99)
Glucose-Capillary: 107 mg/dL — ABNORMAL HIGH (ref 65–99)
Glucose-Capillary: 88 mg/dL (ref 65–99)

## 2016-11-29 LAB — MAGNESIUM: MAGNESIUM: 1.6 mg/dL — AB (ref 1.7–2.4)

## 2016-11-29 LAB — PHOSPHORUS: Phosphorus: 3.3 mg/dL (ref 2.5–4.6)

## 2016-11-29 NOTE — Progress Notes (Signed)
PULMONARY / CRITICAL CARE MEDICINE   Name: Dale [name] MRN: [id number] DOB: 09/12/1966    ADMISSION DATE:  10/06/2016  CHIEF COMPLAINT:  Nausea and Vomiting  BRIEF SUMMARY: [age] yom w/ severe neurological deficits after meningitis.  He was brought to the ED with decreased level of consciousness and nausea/vomiting/diarrhea. A CXR showed consolidation of the left upper lobe. [facility] was intubated and [facility]. Tracheostomy on 12/1 for prolonged ventilation   SUBJECTIVE: Less agitated  VITAL SIGNS: BP (!) 143/99 (BP Location: Left Arm)   Pulse 72   Temp 97.4 F (36.3 C) (Axillary)   Resp (!) 28   Ht 5\' 8"  (1.727 m)   Wt 151 lb 7.3 oz (68.7 kg)   SpO2 97%   BMI 23.03 kg/m    VENTILATOR SETTINGS: Vent Mode: PRVC FiO2 (%):  [30 %-40 %] 30 % Set Rate:  [20 bmp] 20 bmp Vt Set:  [420 mL] 420 mL PEEP:  [5 cmH20] 5 cmH20 Pressure Support:  [8 cmH20] 8 cmH20 Plateau Pressure:  [16 cmH20-18 cmH20] 18 cmH20  INTAKE / OUTPUT: I/O last 3 completed shifts: In: 4968.2 [I.V.:1308.2; Other:120; NG/GT:3540] Out: 3050 [Urine:2900; Chest Tube:150]  PHYSICAL EXAMINATION: General: Middle aged male, chronically ill appearing, not interactive  Neuro:    Does not follow commands, less agitated per RN HEENT: Trach -> t collar Cardiovascular: RRR, no M/R/G.  Lungs: reduced BS bases L > R  Abdomen: BS x 4, soft, NT/ND.  Musculoskeletal: No gross deformities, no edema.  Skin: Intact, warm, no rashes.  LABS:  BMET  Recent Labs Lab 11/23/16 0500 11/24/16 0720 11/28/16 0447  NA 145 140 145  K 3.2* 3.3* 2.9*  CL 104 101 101  CO2 31 29 39*  BUN 12 10 10   CREATININE 0.73 0.69 0.58*  GLUCOSE 107* 104* 89   Electrolytes  Recent Labs Lab 11/23/16 0500 11/24/16 0720 11/28/16 0447  CALCIUM 8.7* 8.2* 8.4*  MG  --   --  1.7    CBC  Recent Labs Lab 11/23/16 0500 11/24/16 0720  WBC 13.2* 12.4*  HGB 7.8* 8.1*  HCT 24.9* 25.7*  PLT 289 282    Coag's  Recent Labs Lab  11/22/16 1236 11/22/16 2040  APTT 48* 48*  INR 1.03 1.06    Sepsis Markers No results for input(s): LATICACIDVEN, PROCALCITON, O2SATVEN in the last 168 hours.  ABG No results for input(s): PHART, PCO2ART, PO2ART in the last 168 hours.  Liver Enzymes No results for input(s): AST, ALT, ALKPHOS, BILITOT, ALBUMIN in the last 168 hours.  Cardiac Enzymes No results for input(s): TROPONINI, PROBNP in the last 168 hours.  Glucose  Recent Labs Lab 11/28/16 0828 11/28/16 1258 11/28/16 1519 11/28/16 2026 11/28/16 2347 11/29/16 0406  GLUCAP 97 106* 105* 85 97 107*    Imaging No results found.   STUDIES:  CXR 11/26 >> LUL PNA EEG 12/5 >> negative for seizure   CULTURES: Blood 11/26 >> coag neg staph 1/2  C. Diff 11/26 >> (-) Resp 11/27 >>  Enterobacter cloacae BAL 12/14 >> proteus (RES cipro otherwise sens)   ANTIBIOTICS: Vanc 11/26 >>11/30 Pip-tazo 11/26 >>12/1 Cipro 12/1 >> 12/8 Ceftriaxone 12/15 > 12/22   SIGNIFICANT EVENTS: 11/26 - Intubated (endotracheally) at [facility] ED   11/29 - bronch with left inspissated pus 12/14 - proteus from BAL 12/18 - Worsening resp status, increased WOB.  S/p FOB 12/14, CXR 12/17 still c complete opacification L hemithorax.    LINES/TUBES: PEG PICC 11/27  RUE >>  Foley Trache 12/1 >>  DISCUSSION: 50 y/o man s/p devastating meningitis, chronic debility, p/w fever, AMS & severe L HCAP vs aspiration. This has been treated.  He is trach/PEG dependent. Mucous plugging has been most recent issue.This will likely be a chronic and recurrent issue. We did obtain a BAL on 12/14 during his FOB which grew proteus and cxr on 12/20 showed improvement in LLL aeration     ASSESSMENT / PLAN:   Acute resp failure/ ARDS/ L HCAP with Enterobacter cloacae - s/p abx.  Worsening resp status 12/18.  Left Pleural Effusion, massive s/p chest tube > improved  Left lung atelectasis/collapse d/t mucous plugging > improved  Hx of trach x2. Trached for the  3rd time on 12/1    Overall some better but overall prognosis for functional recovery seems poor  Continue weaning efforts-hopefully we can progress to trach collar >  Chest tube to waterseal ,  12/26 continues 125 cc in 24 hr period  Nothing else to offer - we will see him again on 12/28 - call in  Meantime if needed   Abrazo Scott[place]ale Campusteve Minor ACNP [name] Pager [phone] till 3 pm If no answer page [phone] 11/29/2016, 8:05 AM

## 2016-11-29 NOTE — Progress Notes (Signed)
RT placed pt on vent full support. RT did an SBT trial first prior to full support. Pt had increased WOB, secretions, agitation, desat 84%. RT placed pt back on full support with same settings. SXN trach moderate clear white frothy

## 2016-11-29 NOTE — Progress Notes (Signed)
Trach secure. Decreased FIO2 to 30% due to stable sats

## 2016-11-29 NOTE — Progress Notes (Signed)
PROGRESS NOTE    Dale [name]  JJK:[id number] DOB: [birth year] DOA: 10/27/2016 PCP: No PCP Per Patient   Brief Narrative:  50 y.o. BM with PMHx Cerebral septic emboli ; Cerebral thrombosis with cerebral infarction (03/25/2016),  Convulsions/seizures  (05/04/2016); Dementia (04/27/2016); Depression; Dysphagia (05/04/2016); ETOH abuse;Tobacco abuse , Hydrocephalus Acute encephalopathy;Meningitis, Acute ischemic stroke, Cardiac arrest; Cardiogenic shock, NSTEMI,  Chronic Diastolic and Systolic CHF    Admission in March 2017 with strep pneumo meningitis c/b cardiac arrest and cardiogenic shock, ARDS requiring prolonged intubation and trach (now decannulated), and acute renal failure. He was admitted to [facility] 06/16/16 through 07/11/16 for recurrent hypoglycemia due to adrenal insufficiency, new subacute CVA, seizures with inability to protect airway s/p intubation 7/13 through 7/21. He re-presented 8/08 again with sepsis from UTI, and was intubated due to AMS, and was trached 8/18, and was discharged with a cuffless trach. That hospitalization was c/b MRSA bacteremia 8/24, and completed 3 weeks of IV vanc. He also developed C. Diff, and was treated with PO vanc through 9/11. He has had continued autonomic dysreflexia, which worsens during complications of acute illnesses.  Prior to this presentation, he had been well, although he was decannulated for reasons that are unclear. He was brought to the ED with decreased level of consciousness and nausea/vomiting/diarrhea. A CXR showed consolidation of the left upper lobe. He was intubated and transferred to [facility].  There have been significant amount of conversations with his wife with regard to goals of care, but she has insisted on full support. He is full code.   Subjective: 12/26   A/O x 0.  Mild w/d to pain open eyes when name called   Assessment & Plan:   Active Problems:   Adrenal insufficiency (Addison's disease) ([place])   Respiratory failure (HCC)  Renal failure   Pressure injury of skin   Acute respiratory failure with hypoxia (HCC)   ARDS (adult respiratory distress syndrome) (HCC)   Severe sepsis (HCC)   Chronic anoxic encephalopathy (HCC)   Systolic and diastolic CHF, chronic (HCC)   Mucus plugging of bronchi   Abdominal distension   Collapse of left lung   Encephalopathy chronic   Adrenal insufficiency (HCC)   Severe malnutrition (HCC)   Acute hypoxic respiratory failure secondary to Enterobacter HCAP w/ ARDS and Sepsis  Status post tracheostomy for the third time - trach care per PCCM with no plans to decannulate - sepsis and acute infection appear to have cleared - tolerating trach collar - SPO2 goal 93% -Left chest tube in place draining significant serosanguineous fluid.  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 -Mental status appears to be at his baseline   Transient Atrial fibrillation -currently NSR   Chronic Systolic and Diastolic CHF -EF 37% Aug 1696  - no evidence of volume overload at this time  -Strict in and out since admission + 8.7 L Filed Weights   11/27/16 0310 11/28/16 0411 11/29/16 0345  Weight: 66.7 kg (147 lb) 68.9 kg (152 lb) 68.7 kg (151 lb 7.3 oz)  - weight is Relatively stable  Adrenal insufficiency  - 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   Hypernatremia -Resolved with increase in free water via tube   Hypokalemia -  Potassium goal > 4   Hypomagnesemia -Magnesium goal > 2   Severe malnutrition in context of chronic illness -Continue tube feeds per PEG   Goals of care - PALLIATIVE CARE: Review chart shows that the care family on 12/15 and family appears not wanted to  engage with palliative care until after the holidays.       DVT prophylaxis: Subcutaneous heparin Code Status: Full Family Communication: None Disposition Plan: Ready for SNF When chest tube discontinued   Consultants:  [facility]  Procedures/Significant Events:  11/26 Intubated (endotracheally) at [facility] ED - [facility]  11/29 bronch - left inspissated pus   VENTILATOR SETTINGS: NA   Cultures   Antimicrobials: Anti-infectives    Start     Stop   11/25/16 1500  cefTRIAXone (ROCEPHIN) 1 g in dextrose 5 % 50 mL IVPB     11/25/16 1724   11/18/16 1500  cefTRIAXone (ROCEPHIN) 1 g in dextrose 5 % 50 mL IVPB  Status:  Discontinued     11/25/16 1015   11/11/16 2000  ciprofloxacin (CIPRO) tablet 750 mg     11/11/16 2010   11/17/2016 1600  ciprofloxacin (CIPRO) tablet 750 mg  Status:  Discontinued     11/11/16 0957   11/02/16 1400  cefTRIAXone (ROCEPHIN) 1 g in dextrose 5 % 50 mL IVPB  Status:  Discontinued     11/11/2016 1328   11/01/16 1000  vancomycin (VANCOCIN) IVPB 1000 mg/200 mL premix  Status:  Discontinued     11/03/16 1026   10/31/16 1600  vancomycin (VANCOCIN) IVPB 750 mg/150 ml premix  Status:  Discontinued     11/01/16 0818   10/31/16 0000  piperacillin-tazobactam (ZOSYN) IVPB 3.375 g  Status:  Discontinued     11/02/16 1254   10/06/2016 1600  piperacillin-tazobactam (ZOSYN) IVPB 3.375 g     10/23/2016 1637   10/31/2016 1600  vancomycin (VANCOCIN) IVPB 1000 mg/200 mL premix     10/05/2016 1731      Devices    LINES / TUBES:      Continuous Infusions: . amiodarone 30 mg/hr (11/29/16 0500)  . feeding supplement (VITAL AF 1.2 CAL) 1,000 mL (11/29/16 0500)     Objective: Vitals:   11/29/16 0614 11/29/16 0615 11/29/16 0616 11/29/16 0743  BP: (!) 149/105 (!) 147/95  (!) 143/99  Pulse: 80 78 76 72  Resp: 18 18 (!) 23 (!) 28  Temp:    97.4 F (36.3 C)  TempSrc:    Axillary  SpO2: 100% 100% 100% 97%  Weight:      Height:        Intake/Output Summary  (Last 24 hours) at 11/29/16 0819 Last data filed at 11/29/16 0753  Gross per 24 hour  Intake          2707.11 ml  Output             2650 ml  Net            57.11 ml   Filed Weights   11/27/16 0310 11/28/16 0411 11/29/16 0345  Weight: 66.7 kg (147 lb) 68.9 kg (152 lb) 68.7 kg (151 lb 7.3 oz)    Examination:  General: No acute respiratory distress  Lungs: Clear to auscultation bilaterally Cardiovascular: Tachycardia w/o gallup or rub   Abdomen: Nondistended, soft, bowel sounds positive Extremities: Stable trace edema B LE  .     Data Reviewed: Care during the described time interval was provided by me .  I have  reviewed this patient's available data, including medical history, events of note, physical examination, and all test results as part of my evaluation. I have personally reviewed and interpreted all radiology studies.  CBC:  Recent Labs Lab 11/23/16 0500 11/24/16 0720  WBC 13.2* 12.4*  HGB 7.8* 8.1*  HCT 24.9* 25.7*  MCV 94.7 91.1  PLT 289 833   Basic Metabolic Panel:  Recent Labs Lab 11/23/16 0500 11/24/16 0720 11/28/16 0447  NA 145 140 145  K 3.2* 3.3* 2.9*  CL 104 101 101  CO2 31 29 39*  GLUCOSE 107* 104* 89  BUN _0 CREATININE 0.73 0.69 0.58*  CALCIUM 8.7* 8.2* 8.4*  MG  --   --  1.7   GFR: Estimated Creatinine Clearance: 104.5 mL/min (by C-G formula based on SCr of 0.58 mg/dL (L)). Liver Function Tests:  Recent Labs Lab 11/23/16 2349  PROT 4.8*   No results for input(s): LIPASE, AMYLASE in the last 168 hours. No results for input(s): AMMONIA in the last 168 hours. Coagulation Profile:  Recent Labs Lab 11/22/16 1236 11/22/16 2040  INR 1.03 1.06   Cardiac Enzymes: No results for input(s): CKTOTAL, CKMB, CKMBINDEX, TROPONINI in the last 168 hours. BNP (last 3 results) No results for input(s): PROBNP in the last 8760 hours. HbA1C: No results for input(s): HGBA1C in the last 72 hours. CBG:  Recent Labs Lab 11/28/16 1258  11/28/16 1519 11/28/16 2026 11/28/16 2347 11/29/16 0406  GLUCAP 106* 105* 85 97 107*   Lipid Profile: No results for input(s): CHOL, HDL, LDLCALC, TRIG, CHOLHDL, LDLDIRECT in the last 72 hours. Thyroid Function Tests: No results for input(s): TSH, T4TOTAL, FREET4, T3FREE, THYROIDAB in the last 72 hours. Anemia Panel: No results for input(s): VITAMINB12, FOLATE, FERRITIN, TIBC, IRON, RETICCTPCT in the last 72 hours. Urine analysis:    Component Value Date/Time   COLORURINE YELLOW 11/02/2016 1807   APPEARANCEUR CLEAR 10/24/2016 1807   LABSPEC <1.005 (L) 10/08/2016 1807   PHURINE 7.0 10/29/2016 1807   GLUCOSEU NEGATIVE 10/23/2016 1807   HGBUR LARGE (A) 10/31/2016 1807   BILIRUBINUR NEGATIVE 10/09/2016 1807   KETONESUR NEGATIVE 10/12/2016 1807   PROTEINUR 30 (A) 10/31/2016 1807   UROBILINOGEN 1.0 11/13/2011 2010   NITRITE NEGATIVE 10/10/2016 1807   LEUKOCYTESUR NEGATIVE 10/12/2016 1807   Sepsis Labs: _1 (procalcitonin:4,lacticidven:4)  ) Recent Results (from the past 240 hour(s))  Culture, blood (routine x 2)     Status: None   Collection Time: 11/22/16 12:35 PM  Result Value Ref Range Status   Specimen Description BLOOD LEFT ARM  Final   Special Requests BOTTLES DRAWN AEROBIC ONLY 5CC  Final   Culture NO GROWTH 5 DAYS  Final   Report Status 11/27/2016 FINAL  Final  Culture, blood (routine x 2)     Status: None   Collection Time: 11/22/16 12:40 PM  Result Value Ref Range Status   Specimen Description BLOOD LEFT ASSIST CONTROL  Final   Special Requests BOTTLES DRAWN AEROBIC ONLY 5CC  Final   Culture NO GROWTH 5 DAYS  Final   Report Status 11/27/2016 FINAL  Final  Body fluid culture     Status: None   Collection Time: 11/23/16  5:29 PM  Result Value Ref Range Status   Specimen Description FLUID PLEURAL LEFT  Final   Special Requests Normal  Final   Gram Stain NO WBC SEEN NO ORGANISMS SEEN   Final   Culture NO GROWTH 3 DAYS  Final   Report Status 11/27/2016  FINAL  Final         Radiology Studies: No results found.      Scheduled Meds: . atorvastatin  20 mg Per Tube q1800  . budesonide (PULMICORT) nebulizer solution  0.5 mg Nebulization BID  . chlorhexidine gluconate (MEDLINE KIT)  15 mL Mouth Rinse BID  . clonazePAM  0.25 mg Per Tube Q6H  . famotidine  20 mg Per Tube BID  . fludrocortisone  0.1 mg Per Tube Daily  . folic acid  1 mg Per Tube Daily  . free water  200 mL Per Tube Q4H  . heparin  5,000 Units Subcutaneous Q8H  . hydrocortisone  10 mg Per Tube QHS  . hydrocortisone  5 mg Per Tube Daily  . ipratropium  0.5 mg Nebulization QID  . levalbuterol  0.63 mg Nebulization QID  . levETIRAcetam  1,500 mg Per Tube BID  . mouth rinse  15 mL Mouth Rinse QID  . metoprolol  10 mg Intravenous Q6H  . QUEtiapine  50 mg Per Tube BID  . thiamine  100 mg Per Tube Daily  . Valproate Sodium  750 mg Per Tube Q8H   Continuous Infusions: . amiodarone 30 mg/hr (11/29/16 0500)  . feeding supplement (VITAL AF 1.2 CAL) 1,000 mL (11/29/16 0500)     LOS: 30 days    Time spent: 40 minutes    Carles Florea, Geraldo Docker, MD [facility] Pager [phone]   If 7PM-7AM, please contact night-coverage [email or web address] Password [email or web address] 11/29/2016, 8:19 AM

## 2016-11-30 LAB — GLUCOSE, CAPILLARY
GLUCOSE-CAPILLARY: 93 mg/dL (ref 65–99)
GLUCOSE-CAPILLARY: 84 mg/dL (ref 65–99)
GLUCOSE-CAPILLARY: 79 mg/dL (ref 65–99)
Glucose-Capillary: 104 mg/dL — ABNORMAL HIGH (ref 65–99)
Glucose-Capillary: 87 mg/dL (ref 65–99)
Glucose-Capillary: 95 mg/dL (ref 65–99)

## 2016-11-30 NOTE — Progress Notes (Signed)
PROGRESS NOTE    Dale [name]  OLM:[id number] DOB: 1966-03-23 DOA: 10/15/2016 PCP: No PCP Per Patient   Brief Narrative: 50 y.o.BM with PMHx Cerebral septic emboli ; Cerebral thrombosis with cerebral infarction (03/25/2016), Convulsions/seizures  (05/04/2016); Dementia (04/27/2016); Depression; Dysphagia (05/04/2016); ETOH abuse;Tobacco abuse , Hydrocephalus Acute encephalopathy;Meningitis, Acute ischemic stroke,Cardiac arrest; Cardiogenic shock, NSTEMI,  Chronic Diastolic and Systolic CHF. Admission in March 2017 with strep pneumo meningitis c/b cardiac arrest and cardiogenic shock, ARDS requiring prolonged intubation and trach (now decannulated), and acute renal failure. He was admitted to [facility] 06/16/16 through 07/11/16 for recurrent hypoglycemia due to adrenal insufficiency, new subacute CVA, seizures with inability to protect airway s/p intubation 7/13 through 7/21. He re-presented 8/08 again with sepsis from UTI, and was intubated due to AMS, and was trached 8/18, and was discharged with a cuffless trach. That hospitalization was c/b MRSA bacteremia 8/24, and completed 3 weeks of IV vanc. He also developed C. Diff, and was treated with PO vanc through 9/11. He has had continued autonomic dysreflexia, which worsens during complications of acute illnesses. Prior to this presentation, he had been well, although he was decannulated for reasons that are unclear. He was brought to the ED with decreased level of consciousness and nausea/vomiting/diarrhea. A CXR showed consolidation of the left upper lobe. He was intubated and transferred to [facility]. There have been significant amount of conversations with his wife with regard to goals of care, but she has insisted on full support. He is full code now. Palliative care following.  Assessment & Plan:   Active Problems:   Adrenal insufficiency (Addison's disease) ([place])   Respiratory failure (HCC)   Renal failure   Pressure injury of skin   Acute respiratory  failure with hypoxia (HCC)   ARDS (adult respiratory distress syndrome) (HCC)   Severe sepsis (HCC)   Chronic anoxic encephalopathy (HCC)   Systolic and diastolic CHF, chronic (HCC)   Mucus plugging of bronchi   Abdominal distension   Collapse of left lung   Encephalopathy chronic   Adrenal insufficiency (HCC)   Severe malnutrition (HCC)  #Acute hypoxic respiratory failure secondary to Enterobacter HCAP w/ ARDS and Sepsis: -Status post tracheostomy for the third time. Tracheostomy care by pulmonary team. Sepsis and infection seems resolved. -Left chest tube in place draining fluid, -Continue current support.  #Enterobacter cloacae left lung pneumonia w/ extensive infiltrate and atx of L lung -Reportedly patient completed full course of antibiotics. Patient had coag negative staph bacteremia in 1 bottle likely contaminant.  #Acute on chronic encephalopathy: Reportedly patient had the last reading meningitis since then his mental status is declining. No sign of recovery. -On Keppra.  #Transient atrial fibrillation: Currently in sinus rhythm.  #Chronic systolic and diastolic congestive heart failure: Symptoms are stable. Continue to provide supportive care.  #Acute renal insufficiency: On Florinef and hydrocortisone.  #Severe protein calorie malnutrition: Currently on tube feed. Dietary follow-up  #Goals of care: It seems like patient is not clinically improving. Palliative care has been following. Given multiple comorbidities and no sign of recovery I think  the patient will benefit from hospice care.  DVT prophylaxis:Heparin subcutaneous  Code Status:Full code  Family Communication:No family present at bedside  Disposition Plan:Unknown disposition plan, likely to nursing home in a few days to week.  Consultants:   Pulmonologist  Palliative care  Procedures:Intubation, bronchus, tracheostomy  Antimicrobials:Multiple antibiotics during prolonged hospitalization including  ceftriaxone, vancomycin, Zosyn  Subjective:Patient was seen and examined at bedside. Patient was opening eyes with painful  stimuli. Unable to obtain review of system because of encephalopathy.   Objective: Vitals:   11/30/16 0850 11/30/16 0924 11/30/16 1201 11/30/16 1236  BP:    123/81  Pulse:  73 68 66  Resp:  19 (!) 21 16  Temp:    97.4 F (36.3 C)  TempSrc:    Oral  SpO2: 92% 97% 95% 96%  Weight:      Height:        Intake/Output Summary (Last 24 hours) at 11/30/16 1450 Last data filed at 11/30/16 1400  Gross per 24 hour  Intake          3190.53 ml  Output             1525 ml  Net          1665.53 ml   Filed Weights   11/28/16 0411 11/29/16 0345 11/30/16 0500  Weight: 68.9 kg (152 lb) 68.7 kg (151 lb 7.3 oz) 69.4 kg (153 lb)    Examination:  General exam:Very ill looking cachectic male lying on bed,  Neck: Tracheostomy site clean Respiratory system: Coarse breath sound bilateral, respiratory effort normal.  Cardiovascular system: S1 & S2 heard, RRR.  No pedal edema. Gastrointestinal system:Abdomen soft, nondistended. Bowel sound positive.. Central nervous system:Only opens eye with painful stimuli.  Extremities:Contracted, decreased muscle mass  Psychiatry: Judgement and insight unable to assess because of encephalopathy..     Data Reviewed: I have personally reviewed following labs and imaging studies  CBC:  Recent Labs Lab 11/24/16 0720  WBC 12.4*  HGB 8.1*  HCT 25.7*  MCV 91.1  PLT 878   Basic Metabolic Panel:  Recent Labs Lab 11/24/16 0720 11/28/16 0447 11/29/16 1106  NA 140 145 144  K 3.3* 2.9* 3.8  CL 101 101 101  CO2 29 39* 30  GLUCOSE 104* 89 76  BUN _0 CREATININE 0.69 0.58* 0.65  CALCIUM 8.2* 8.4* 8.8*  MG  --  1.7 1.6*  PHOS  --   --  3.3   GFR: Estimated Creatinine Clearance: 104.5 mL/min (by C-G formula based on SCr of 0.65 mg/dL). Liver Function Tests:  Recent Labs Lab 11/23/16 2349  PROT 4.8*   No results for  input(s): LIPASE, AMYLASE in the last 168 hours. No results for input(s): AMMONIA in the last 168 hours. Coagulation Profile: No results for input(s): INR, PROTIME in the last 168 hours. Cardiac Enzymes: No results for input(s): CKTOTAL, CKMB, CKMBINDEX, TROPONINI in the last 168 hours. BNP (last 3 results) No results for input(s): PROBNP in the last 8760 hours. HbA1C: No results for input(s): HGBA1C in the last 72 hours. CBG:  Recent Labs Lab 11/29/16 2010 11/30/16 0022 11/30/16 0517 11/30/16 0817 11/30/16 1235  GLUCAP 95 104* 93 84 87   Lipid Profile: No results for input(s): CHOL, HDL, LDLCALC, TRIG, CHOLHDL, LDLDIRECT in the last 72 hours. Thyroid Function Tests: No results for input(s): TSH, T4TOTAL, FREET4, T3FREE, THYROIDAB in the last 72 hours. Anemia Panel: No results for input(s): VITAMINB12, FOLATE, FERRITIN, TIBC, IRON, RETICCTPCT in the last 72 hours. Sepsis Labs: No results for input(s): PROCALCITON, LATICACIDVEN in the last 168 hours.  Recent Results (from the past 240 hour(s))  Culture, blood (routine x 2)     Status: None   Collection Time: 11/22/16 12:35 PM  Result Value Ref Range Status   Specimen Description BLOOD LEFT ARM  Final   Special Requests BOTTLES DRAWN AEROBIC ONLY 5CC  Final   Culture NO GROWTH  5 DAYS  Final   Report Status 11/27/2016 FINAL  Final  Culture, blood (routine x 2)     Status: None   Collection Time: 11/22/16 12:40 PM  Result Value Ref Range Status   Specimen Description BLOOD LEFT ASSIST CONTROL  Final   Special Requests BOTTLES DRAWN AEROBIC ONLY 5CC  Final   Culture NO GROWTH 5 DAYS  Final   Report Status 11/27/2016 FINAL  Final  Body fluid culture     Status: None   Collection Time: 11/23/16  5:29 PM  Result Value Ref Range Status   Specimen Description FLUID PLEURAL LEFT  Final   Special Requests Normal  Final   Gram Stain NO WBC SEEN NO ORGANISMS SEEN   Final   Culture NO GROWTH 3 DAYS  Final   Report Status  11/27/2016 FINAL  Final         Radiology Studies: No results found.      Scheduled Meds: . atorvastatin  20 mg Per Tube q1800  . budesonide (PULMICORT) nebulizer solution  0.5 mg Nebulization BID  . chlorhexidine gluconate (MEDLINE KIT)  15 mL Mouth Rinse BID  . clonazePAM  0.25 mg Per Tube Q6H  . famotidine  20 mg Per Tube BID  . fludrocortisone  0.1 mg Per Tube Daily  . folic acid  1 mg Per Tube Daily  . free water  200 mL Per Tube Q4H  . heparin  5,000 Units Subcutaneous Q8H  . hydrocortisone  10 mg Per Tube QHS  . hydrocortisone  5 mg Per Tube Daily  . ipratropium  0.5 mg Nebulization QID  . levalbuterol  0.63 mg Nebulization QID  . levETIRAcetam  1,500 mg Per Tube BID  . mouth rinse  15 mL Mouth Rinse QID  . metoprolol  10 mg Intravenous Q6H  . QUEtiapine  50 mg Per Tube BID  . thiamine  100 mg Per Tube Daily  . Valproate Sodium  750 mg Per Tube Q8H   Continuous Infusions: . amiodarone 30 mg/hr (11/30/16 0510)  . feeding supplement (VITAL AF 1.2 CAL) 1,000 mL (11/30/16 0625)     LOS: 31 days    Dron Tanna Furry, MD [facility] Pager [phone]  If 7PM-7AM, please contact night-coverage [email or web address] Password [email or web address] 11/30/2016, 2:50 PM

## 2016-11-30 NOTE — Progress Notes (Signed)
Palliative Medicine RN Note: Reached out to wife to schedule meeting with Dr Phillips OdorGolding, as family didn't want to meet until "after the holidays." [name] states she is sick herself and has no transportation; because of this, she cannot agree to a meeting at this time.  Plan to call her again next week (she said this is ok with her), as she won't be available any time this week.  Dale [name] Steve, RN, BSN, [facility] 11/30/2016 10:02 AM Cell [phone] 8:00-4:00 Monday-Friday Office [phone]

## 2016-11-30 NOTE — Progress Notes (Signed)
Nutrition Follow-up  DOCUMENTATION CODES:   Severe malnutrition in context of chronic illness  INTERVENTION:    Continue Vital AF 1.2 via PEG at 65 ml/h to provide 1872 kcals, 117 gm protein, 1265 ml free water daily.  Continue free water flushes 200 ml every 4 hours.  NUTRITION DIAGNOSIS:   Malnutrition (Severe) related to chronic illness as evidenced by severe depletion of body fat, severe depletion of muscle mass  Ongoing  GOAL:   Patient will meet greater than or equal to 90% of their needs  Met  MONITOR:   TF tolerance, I & O's, Vent status, Labs  ASSESSMENT:   Pt with strep pneumo meningitis c/b cardiac arrest and cardiogenic shock, ARDS requiring prolonged intubation and trach (now decannulated), and acute renal failure.  He was admitted to [facility] 06/16/16 through 07/11/16 for recurrent hypoglycemia due to adrenal insufficiency, new subacute CVA, seizures with inability to protect airway s/p intubation 7/13 through 7/21.  He re-presented 8/08 again with sepsis from UTI, and was intubated due to AMS, and was trached 8/18, and was discharged with a cuffless trach. That hospitalization was c/b MRSA bacteremia 8/24, and completed 3 weeks of IV vanc. He also developed C. Diff, and was treated with PO vanc through 9/11. He has had continued autonomic dysreflexia, which worsens during complications of acute illnesses. Admitted with N/V/D.   Pt transferred from [facility] to [facility] 12/8. S/p devastating meningitis, chronic debility and severe HCAP vs aspiration.  Patient is currently on ventilator support >> trach MV: 7.0 L/min Temp (24hrs), Avg:97.3 F (36.3 C), Min:96.1 F (35.6 C), Max:97.9 F (36.6 C)  Noted Rapid Response event 12/16. Receiving Vital AF 1.2 via PEG at 65 ml/h to provide 1872 kcals, 117 gm protein, 1265 ml free water daily.  Free water flushes 200 ml every 4 hours. Palliative Care Team following. Weight fluctuating but stable.  Diet  Order:  Diet NPO time specified  Skin:  Wound (see comment) (unstageable right heel)  Last BM:  12/25  Height:   Ht Readings from Last 1 Encounters:  10/31/16 _0  (1.727 m)    Weight:  Wt Readings from Last 1 Encounters:  11/30/16 153 lb (69.4 kg)   12/26  151 lb 12/25  152 lb 12/24  147 lb 12/23  148 lb 12/22  154 lb 12/21  159 lb 12/20  160 lb 12/19  154 lb 12/18  144 lb 12/17  143 lb 12/16  155 lb  Ideal Body Weight:  70 kg  BMI:  Body mass index is 23.26 kg/m.  Estimated Nutritional Needs:   Kcal:  1800-2000  Protein:  110-120 gm  Fluid:  1.8-2.0 L  EDUCATION NEEDS:   No education needs identified at this time  Arthur Holms, RD, LDN Pager #: [phone] After-Hours Pager #: [phone]

## 2016-12-01 LAB — GLUCOSE, CAPILLARY
GLUCOSE-CAPILLARY: 100 mg/dL — AB (ref 65–99)
GLUCOSE-CAPILLARY: 103 mg/dL — AB (ref 65–99)
GLUCOSE-CAPILLARY: 114 mg/dL — AB (ref 65–99)
Glucose-Capillary: 95 mg/dL (ref 65–99)
Glucose-Capillary: 91 mg/dL (ref 65–99)
Glucose-Capillary: 83 mg/dL (ref 65–99)
Glucose-Capillary: 106 mg/dL — ABNORMAL HIGH (ref 65–99)

## 2016-12-01 MED ORDER — AMIODARONE HCL 200 MG PO TABS
200.0000 mg | ORAL_TABLET | Freq: Two times a day (BID) | ORAL | Status: DC
  Administered 2016-12-01 – 2016-12-23 (×43): 200 mg via ORAL
  Filled 2016-12-01 (×46): qty 1

## 2016-12-01 NOTE — Progress Notes (Addendum)
PROGRESS NOTE    Dale [name]  CBS:[id number] DOB: [birth year] DOA: 10/13/2016 PCP: No PCP Per Patient   Brief Narrative: 50 y.o.BM with PMHx Cerebral septic emboli ; Cerebral thrombosis with cerebral infarction (03/25/2016), Convulsions/seizures  (05/04/2016); Dementia (04/27/2016); Depression; Dysphagia (05/04/2016); ETOH abuse;Tobacco abuse , Hydrocephalus Acute encephalopathy;Meningitis, Acute ischemic stroke,Cardiac arrest; Cardiogenic shock, NSTEMI,  Chronic Diastolic and Systolic CHF. Admission in March 2017 with strep pneumo meningitis c/b cardiac arrest and cardiogenic shock, ARDS requiring prolonged intubation and trach (now decannulated), and acute renal failure. He was admitted to [facility] 06/16/16 through 07/11/16 for recurrent hypoglycemia due to adrenal insufficiency, new subacute CVA, seizures with inability to protect airway s/p intubation 7/13 through 7/21. He re-presented 8/08 again with sepsis from UTI, and was intubated due to AMS, and was trached 8/18, and was discharged with a cuffless trach. That hospitalization was c/b MRSA bacteremia 8/24, and completed 3 weeks of IV vanc. He also developed C. Diff, and was treated with PO vanc through 9/11. He has had continued autonomic dysreflexia, which worsens during complications of acute illnesses. Prior to this presentation, he had been well, although he was decannulated for reasons that are unclear. He was brought to the ED with decreased level of consciousness and nausea/vomiting/diarrhea. A CXR showed consolidation of the left upper lobe. He was intubated and transferred to [facility]. There have been significant amount of conversations with his wife with regard to goals of care, but she has insisted on full support. He is full code now. Palliative care following.  Assessment & Plan:   Active Problems:   Adrenal insufficiency (Addison's disease) ([place])   Respiratory failure (HCC)   Renal failure   Pressure injury of skin   Acute respiratory  failure with hypoxia (HCC)   ARDS (adult respiratory distress syndrome) (HCC)   Severe sepsis (HCC)   Chronic anoxic encephalopathy (HCC)   Systolic and diastolic CHF, chronic (HCC)   Mucus plugging of bronchi   Abdominal distension   Collapse of left lung   Encephalopathy chronic   Adrenal insufficiency (HCC)   Severe malnutrition (HCC)  #Acute hypoxic respiratory failure secondary to Enterobacter HCAP w/ ARDS and Sepsis: -Status post tracheostomy for the third time. Tracheostomy care by pulmonary team. Sepsis and infection seems resolved. -Left chest tube in place draining fluid, -Continue current support. Required vent support today.  #Enterobacter cloacae left lung pneumonia w/ extensive infiltrate and atx of L lung -Reportedly patient completed full course of antibiotics. Patient had coag negative staph bacteremia in 1 bottle likely contaminant.  #Acute on chronic encephalopathy: Reportedly patient had the last reading meningitis since then his mental status is declining. No sign of recovery. -On Keppra.  #Transient atrial fibrillation: Switch amiodarone via tube feeding. Discussed with the pharmacist.  #Chronic systolic and diastolic congestive heart failure: Symptoms are stable. Continue to provide supportive care.  #Acute renal insufficiency: On Florinef and hydrocortisone.  #Severe protein calorie malnutrition: Currently on tube feed. Dietary follow-up  #Goals of care: It seems like patient is not clinically improving. Palliative care has been following. Given multiple comorbidities and no sign of recovery I think  the patient will benefit from hospice care. May need to involve ethics committee if it is difficult to make families included in decision-making for patient's best interest. I discussed with the case manager today.  DVT prophylaxis:Heparin subcutaneous  Code Status:Full code  Family Communication:No family present at bedside  Disposition Plan:Unknown  disposition plan, likely to nursing home in a few days to week.  Consultants:   Pulmonologist  Palliative care  Procedures:Intubation, bronchus, tracheostomy  Antimicrobials:Multiple antibiotics during prolonged hospitalization including ceftriaxone, vancomycin, Zosyn  Subjective:Patient was seen and examined at bedside. No new event today. Opens eyes with painful stimuli. Unable to obtain review of systems with the patient. Objective: Vitals:   12/01/16 0815 12/01/16 1152 12/01/16 1204 12/01/16 1238  BP:    (!) 121/96  Pulse:   70 94  Resp:   (!) 25 (!) 21  Temp:    98.4 F (36.9 C)  TempSrc:    Oral  SpO2: 92% 96% 95% 93%  Weight:      Height:        Intake/Output Summary (Last 24 hours) at 12/01/16 1303 Last data filed at 12/01/16 1238  Gross per 24 hour  Intake           1742.3 ml  Output             2300 ml  Net           -557.7 ml   Filed Weights   11/29/16 0345 11/30/16 0500 12/01/16 0344  Weight: 68.7 kg (151 lb 7.3 oz) 69.4 kg (153 lb) 68.5 kg (151 lb)    Examination:  General exam:Very ill-looking cachectic male lying on bed. Neck: Tracheostomy site clean Respiratory system: Coarse breath sounds bilateral, respiratory effort normal. Left-sided chest tube.  Cardiovascular system: S1 & S2 heard, RRR.  No pedal edema. Gastrointestinal system:Abdomen soft, nondistended. Bowel sound positive.. Central nervous system:Only opens eye with painful stimuli.  Extremities:Contracted, decreased muscle mass  Psychiatry: Judgement and insight unable to assess because of encephalopathy..     Data Reviewed: I have personally reviewed following labs and imaging studies  CBC: No results for input(s): WBC, NEUTROABS, HGB, HCT, MCV, PLT in the last 168 hours. Basic Metabolic Panel:  Recent Labs Lab 11/28/16 0447 11/29/16 1106  NA 145 144  K 2.9* 3.8  CL 101 101  CO2 39* 30  GLUCOSE 89 76  BUN 10 11  CREATININE 0.58* 0.65  CALCIUM 8.4* 8.8*  MG 1.7 1.6*  PHOS   --  3.3   GFR: Estimated Creatinine Clearance: 104.5 mL/min (by C-G formula based on SCr of 0.65 mg/dL). Liver Function Tests: No results for input(s): AST, ALT, ALKPHOS, BILITOT, PROT, ALBUMIN in the last 168 hours. No results for input(s): LIPASE, AMYLASE in the last 168 hours. No results for input(s): AMMONIA in the last 168 hours. Coagulation Profile: No results for input(s): INR, PROTIME in the last 168 hours. Cardiac Enzymes: No results for input(s): CKTOTAL, CKMB, CKMBINDEX, TROPONINI in the last 168 hours. BNP (last 3 results) No results for input(s): PROBNP in the last 8760 hours. HbA1C: No results for input(s): HGBA1C in the last 72 hours. CBG:  Recent Labs Lab 11/30/16 2141 12/01/16 0049 12/01/16 0339 12/01/16 0743 12/01/16 1234  GLUCAP 79 114* 103* 100* 95   Lipid Profile: No results for input(s): CHOL, HDL, LDLCALC, TRIG, CHOLHDL, LDLDIRECT in the last 72 hours. Thyroid Function Tests: No results for input(s): TSH, T4TOTAL, FREET4, T3FREE, THYROIDAB in the last 72 hours. Anemia Panel: No results for input(s): VITAMINB12, FOLATE, FERRITIN, TIBC, IRON, RETICCTPCT in the last 72 hours. Sepsis Labs: No results for input(s): PROCALCITON, LATICACIDVEN in the last 168 hours.  Recent Results (from the past 240 hour(s))  Culture, blood (routine x 2)     Status: None   Collection Time: 11/22/16 12:35 PM  Result Value Ref Range Status   Specimen Description BLOOD  LEFT ARM  Final   Special Requests BOTTLES DRAWN AEROBIC ONLY 5CC  Final   Culture NO GROWTH 5 DAYS  Final   Report Status 11/27/2016 FINAL  Final  Culture, blood (routine x 2)     Status: None   Collection Time: 11/22/16 12:40 PM  Result Value Ref Range Status   Specimen Description BLOOD LEFT ASSIST CONTROL  Final   Special Requests BOTTLES DRAWN AEROBIC ONLY 5CC  Final   Culture NO GROWTH 5 DAYS  Final   Report Status 11/27/2016 FINAL  Final  Body fluid culture     Status: None   Collection Time:  11/23/16  5:29 PM  Result Value Ref Range Status   Specimen Description FLUID PLEURAL LEFT  Final   Special Requests Normal  Final   Gram Stain NO WBC SEEN NO ORGANISMS SEEN   Final   Culture NO GROWTH 3 DAYS  Final   Report Status 11/27/2016 FINAL  Final         Radiology Studies: No results found.      Scheduled Meds: . atorvastatin  20 mg Per Tube q1800  . budesonide (PULMICORT) nebulizer solution  0.5 mg Nebulization BID  . chlorhexidine gluconate (MEDLINE KIT)  15 mL Mouth Rinse BID  . clonazePAM  0.25 mg Per Tube Q6H  . famotidine  20 mg Per Tube BID  . fludrocortisone  0.1 mg Per Tube Daily  . folic acid  1 mg Per Tube Daily  . free water  200 mL Per Tube Q4H  . heparin  5,000 Units Subcutaneous Q8H  . hydrocortisone  10 mg Per Tube QHS  . hydrocortisone  5 mg Per Tube Daily  . ipratropium  0.5 mg Nebulization QID  . levalbuterol  0.63 mg Nebulization QID  . levETIRAcetam  1,500 mg Per Tube BID  . mouth rinse  15 mL Mouth Rinse QID  . metoprolol  10 mg Intravenous Q6H  . QUEtiapine  50 mg Per Tube BID  . thiamine  100 mg Per Tube Daily  . Valproate Sodium  750 mg Per Tube Q8H   Continuous Infusions: . amiodarone 30 mg/hr (12/01/16 0411)  . feeding supplement (VITAL AF 1.2 CAL) 1,000 mL (11/30/16 2231)     LOS: 32 days    Doris Gruhn Tanna Furry, MD [facility] Pager [phone]  If 7PM-7AM, please contact night-coverage [email or web address] Password [email or web address] 12/01/2016, 1:03 PM

## 2016-12-01 NOTE — Progress Notes (Signed)
Placed pt back in full support on vent due to SPO2 85%.

## 2016-12-02 LAB — GLUCOSE, CAPILLARY
GLUCOSE-CAPILLARY: 87 mg/dL (ref 65–99)
GLUCOSE-CAPILLARY: 99 mg/dL (ref 65–99)
GLUCOSE-CAPILLARY: 82 mg/dL (ref 65–99)
Glucose-Capillary: 94 mg/dL (ref 65–99)
Glucose-Capillary: 82 mg/dL (ref 65–99)

## 2016-12-02 NOTE — Progress Notes (Signed)
PULMONARY / CRITICAL CARE MEDICINE   Name: Dale [name] MRN: [id number] DOB: 11/07/1966    ADMISSION DATE:  10/06/2016  CHIEF COMPLAINT:  Nausea and Vomiting  BRIEF SUMMARY: 89 yom w/ severe neurological deficits after meningitis.  He was brought to the ED with decreased level of consciousness and nausea/vomiting/diarrhea. A CXR showed consolidation of the left upper lobe. [facility] Tracheostomy on 12/1 for prolonged ventilation   SUBJECTIVE: Less agitated   VITAL SIGNS: BP 92/69   Pulse 78   Temp 97.3 F (36.3 C) (Axillary)   Resp 20   Ht 5\' 8"  (1.727 m)   Wt 153 lb (69.4 kg)   SpO2 96%   BMI 23.26 kg/m    VENTILATOR SETTINGS: Vent Mode: PRVC FiO2 (%):  [35 %-50 %] 35 % Set Rate:  [20 bmp] 20 bmp Vt Set:  [420 mL] 420 mL PEEP:  [5 cmH20] 5 cmH20 Plateau Pressure:  [17 cmH20-19 cmH20] 19 cmH20  INTAKE / OUTPUT: I/O last 3 completed shifts: In: 3100.8 [I.V.:760.8; NG/GT:2340] Out: 4955 [Urine:4775; Chest Tube:180]  PHYSICAL EXAMINATION: General: Middle aged male, chronically ill appearing, not interactive  Neuro:    Does not follow commands, no follow commands HEENT: Trach -> t collar Cardiovascular: RRR, no M/R/G.  Lungs: reduced BS bases L > R  Abdomen: BS x 4, soft, NT/ND.  Musculoskeletal: No gross deformities, no edema.  Skin: Intact, warm, no rashes.  LABS:  BMET  Recent Labs Lab 11/28/16 0447 11/29/16 1106  NA 145 144  K 2.9* 3.8  CL 101 101  CO2 39* 30  BUN 10 11  CREATININE 0.58* 0.65  GLUCOSE 89 76   Electrolytes  Recent Labs Lab 11/28/16 0447 11/29/16 1106  CALCIUM 8.4* 8.8*  MG 1.7 1.6*  PHOS  --  3.3    CBC No results for input(s): WBC, HGB, HCT, PLT in the last 168 hours.  Coag's No results for input(s): APTT, INR in the last 168 hours.  Sepsis Markers No results for input(s): LATICACIDVEN, PROCALCITON, O2SATVEN in the last 168 hours.  ABG No results for input(s): PHART, PCO2ART,  PO2ART in the last 168 hours.  Liver Enzymes No results for input(s): AST, ALT, ALKPHOS, BILITOT, ALBUMIN in the last 168 hours.  Cardiac Enzymes No results for input(s): TROPONINI, PROBNP in the last 168 hours.  Glucose  Recent Labs Lab 12/01/16 1234 12/01/16 1632 12/01/16 2007 12/01/16 2327 12/02/16 0418 12/02/16 0831  GLUCAP 95 91 83 106* 87 94    Imaging No results found.   STUDIES:  CXR 11/26 >> LUL PNA EEG 12/5 >> negative for seizure   CULTURES: Blood 11/26 >> coag neg staph 1/2  C. Diff 11/26 >> (-) Resp 11/27 >>  Enterobacter cloacae BAL 12/14 >> proteus (RES cipro otherwise sens)   ANTIBIOTICS: Vanc 11/26 >>11/30 Pip-tazo 11/26 >>12/1 Cipro 12/1 >> 12/8 Ceftriaxone 12/15 > 12/22   SIGNIFICANT EVENTS: 11/26 - Intubated (endotracheally) at [facility] ED   11/29 - bronch with left inspissated pus 12/14 - proteus from BAL 12/18 - Worsening resp status, increased WOB.  S/p FOB 12/14, CXR 12/17 still c complete opacification L hemithorax.    LINES/TUBES: PEG PICC 11/27  RUE >> Foley [name] 12/1 >>  DISCUSSION: 50 y/o man s/p devastating meningitis, chronic debility, p/w fever, AMS & severe L HCAP vs aspiration. This has been treated.  He is trach/PEG dependent. Mucous plugging has been most recent issue.This will likely be a chronic and recurrent  issue. We did obtain a BAL on 12/14 during his FOB which grew proteus and cxr on 12/20 showed improvement in LLL aeration     ASSESSMENT / PLAN:   Acute resp failure/ ARDS/ L HCAP with Enterobacter cloacae - s/p abx.  Worsening resp status 12/18.  Left Pleural Effusion, massive s/p chest tube > improved  Left lung atelectasis/collapse d/t mucous plugging > improved  Hx of trach x2. Trached for the 3rd time on 12/1    Overall some better but overall prognosis for functional recovery seems poor Change trach 12/29  Continue weaning efforts-hopefully we can progress to trach collar >  Chest tube to waterseal ,   12/26 continues 80 cc in 24 hr period  Nothing else to offer - we will see him again on 12/06/16- call in  Meantime if needed   [facility] ACNP [name] Pager [phone] till 3 pm If no answer page [phone] 12/02/2016, 10:37 AM   Attending Note:  I have examined patient, reviewed labs, studies and notes. I have discussed the case with S Minor, and I agree with the data and plans as amended above. [facility] Would like to d/c chest tube soon, once output drops < 50cc/day. Will follow for trach care - to be changed today 12/29.   Levy Pupaobert Jisele Price, MD, PhD 12/02/2016, 3:33 PM [facility] [phone] or if no answer [phone]

## 2016-12-02 NOTE — Progress Notes (Signed)
PROGRESS NOTE    Dale [name]  KPT:[id number] DOB: [birth year] DOA: 10/12/2016 PCP: No PCP Per Patient   Brief Narrative:  50 y.o. BM with PMHx Cerebral septic emboli ; Cerebral thrombosis with cerebral infarction (03/25/2016),  Convulsions/seizures  (05/04/2016); Dementia (04/27/2016); Depression; Dysphagia (05/04/2016); ETOH abuse;Tobacco abuse , Hydrocephalus Acute encephalopathy;Meningitis, Acute ischemic stroke, Cardiac arrest; Cardiogenic shock, NSTEMI,  Chronic Diastolic and Systolic CHF    Admission in March 2017 with strep pneumo meningitis c/b cardiac arrest and cardiogenic shock, ARDS requiring prolonged intubation and trach (now decannulated), and acute renal failure. He was admitted to [facility] 06/16/16 through 07/11/16 for recurrent hypoglycemia due to adrenal insufficiency, new subacute CVA, seizures with inability to protect airway s/p intubation 7/13 through 7/21. He re-presented 8/08 again with sepsis from UTI, and was intubated due to AMS, and was trached 8/18, and was discharged with a cuffless trach. That hospitalization was c/b MRSA bacteremia 8/24, and completed 3 weeks of IV vanc. He also developed C. Diff, and was treated with PO vanc through 9/11. He has had continued autonomic dysreflexia, which worsens during complications of acute illnesses.  Prior to this presentation, he had been well, although he was decannulated for reasons that are unclear. He was brought to the ED with decreased level of consciousness and nausea/vomiting/diarrhea. A CXR showed consolidation of the left upper lobe. He was intubated and transferred to [facility].  There have been significant amount of conversations with his wife with regard to goals of care, but she has insisted on full support. He is full code.   Subjective: 12/29   A/O x 0.  Mild w/d to pain open eyes when name called   Assessment & Plan:   Active Problems:   Adrenal insufficiency (Addison's disease) ([place])   Respiratory failure (HCC)  Renal failure   Pressure injury of skin   Acute respiratory failure with hypoxia (HCC)   ARDS (adult respiratory distress syndrome) (HCC)   Severe sepsis (HCC)   Chronic anoxic encephalopathy (HCC)   Systolic and diastolic CHF, chronic (HCC)   Mucus plugging of bronchi   Abdominal distension   Collapse of left lung   Encephalopathy chronic   Adrenal insufficiency (HCC)   Severe malnutrition (HCC)   Acute hypoxic respiratory failure secondary to Enterobacter HCAP w/ ARDS and Sepsis  Status post tracheostomy for the third time - trach care per PCCM with no plans to decannulate - sepsis and acute infection appear to have cleared - tolerating trach collar - SPO2 goal 93% -Left chest tube in place draining  mostly serous fluid.  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 -Mental status appears to be at his baseline   Transient Atrial fibrillation -currently NSR   Chronic Systolic and Diastolic CHF -EF 17% Aug 0017  - no evidence of volume overload at this time  -Strict in and out since admission + 8.4 L Filed Weights   11/30/16 0500 12/01/16 0344 12/02/16 0417  Weight: 69.4 kg (153 lb) 68.5 kg (151 lb) 69.4 kg (153 lb)  - weight is Relatively stable  Adrenal insufficiency  - 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   Hypernatremia -Resolved with increase in free water via tube   Hypokalemia -Potassium  goal > 4   Hypomagnesemia -Magnesium goal > 2   Severe malnutrition in context of chronic illness -Continue tube feeds per PEG   Goals of care - PALLIATIVE CARE: Review chart shows that the care family on 12/15 and family appears not wanted to engage with palliative  care until after the holidays.  -Review of chart shows that palliative care/LCSW have attempted on numerous occasions to contact wife and now wife states she is sick and cannot make a decision. May need to start proceedings on having a guardian appointed by the court      DVT prophylaxis: Subcutaneous heparin Code Status: Full Family Communication: None Disposition Plan: Ready for SNF When chest tube discontinued   Consultants:  [facility]  Procedures/Significant Events:  11/26 Intubated (endotracheally) at [facility] ED - transferred to [facility]  11/29 bronch - left inspissated pus   VENTILATOR SETTINGS: NA   Cultures   Antimicrobials: Anti-infectives    Start     Stop   11/25/16 1500  cefTRIAXone (ROCEPHIN) 1 g in dextrose 5 % 50 mL IVPB     11/25/16 1724   11/18/16 1500  cefTRIAXone (ROCEPHIN) 1 g in dextrose 5 % 50 mL IVPB  Status:  Discontinued     11/25/16 1015   11/11/16 2000  ciprofloxacin (CIPRO) tablet 750 mg     11/11/16 2010   11/25/2016 1600  ciprofloxacin (CIPRO) tablet 750 mg  Status:  Discontinued     11/11/16 0957   11/02/16 1400  cefTRIAXone (ROCEPHIN) 1 g in dextrose 5 % 50 mL IVPB  Status:  Discontinued     12/04/2016 1328   11/01/16 1000  vancomycin (VANCOCIN) IVPB 1000 mg/200 mL premix  Status:  Discontinued     11/03/16 1026   10/31/16 1600  vancomycin (VANCOCIN) IVPB 750 mg/150 ml premix  Status:  Discontinued     11/01/16 0818   10/31/16 0000  piperacillin-tazobactam (ZOSYN) IVPB 3.375 g  Status:  Discontinued     11/02/16 1254   11/01/2016 1600  piperacillin-tazobactam (ZOSYN) IVPB 3.375 g     10/23/2016 1637   10/09/2016 1600  vancomycin (VANCOCIN) IVPB 1000 mg/200 mL premix     10/18/2016 1731      Devices    LINES / TUBES:      Continuous Infusions: . feeding supplement (VITAL AF 1.2 CAL) 1,000 mL (12/02/16 0423)     Objective: Vitals:   12/02/16 1400 12/02/16 1500 12/02/16 1556 12/02/16 1600  BP: (!) 144/86 115/80  (!) 144/82  Pulse: 80 73 80  72  Resp: 19 (!) 23 (!) 26 (!) 22  Temp:    98.1 F (36.7 C)  TempSrc:    Axillary  SpO2: 92% 93% 94% 94%  Weight:      Height:        Intake/Output Summary (Last 24 hours) at 12/02/16 1857 Last data filed at 12/02/16 1600  Gross per 24 hour  Intake           2333.5 ml  Output             4535 ml  Net          -2201.5 ml   Filed Weights   11/30/16 0500 12/01/16 0344 12/02/16 0417  Weight: 69.4 kg (153 lb) 68.5 kg (151 lb) 69.4 kg (153 lb)    Examination:  General: No acute respiratory distress  Lungs: Clear to auscultation bilaterally Cardiovascular: Tachycardia w/o gallup or rub   Abdomen: Nondistended, soft, bowel sounds  positive Extremities: Stable trace edema B LE  .     Data Reviewed: Care during the described time interval was provided by me .  I have reviewed this patient's available data, including medical history, events of note, physical examination, and all test results as part of my evaluation. I have personally reviewed and interpreted all radiology studies.  CBC: No results for input(s): WBC, NEUTROABS, HGB, HCT, MCV, PLT in the last 168 hours. Basic Metabolic Panel:  Recent Labs Lab 11/28/16 0447 11/29/16 1106  NA 145 144  K 2.9* 3.8  CL 101 101  CO2 39* 30  GLUCOSE 89 76  BUN 10 11  CREATININE 0.58* 0.65  CALCIUM 8.4* 8.8*  MG 1.7 1.6*  PHOS  --  3.3   GFR: Estimated Creatinine Clearance: 104.5 mL/min (by C-G formula based on SCr of 0.65 mg/dL). Liver Function Tests: No results for input(s): AST, ALT, ALKPHOS, BILITOT, PROT, ALBUMIN in the last 168 hours. No results for input(s): LIPASE, AMYLASE in the last 168 hours. No results for input(s): AMMONIA in the last 168 hours. Coagulation Profile: No results for input(s): INR, PROTIME in the last 168 hours. Cardiac Enzymes: No results for input(s): CKTOTAL, CKMB, CKMBINDEX, TROPONINI in the last 168 hours. BNP (last 3 results) No results for input(s): PROBNP in the last 8760  hours. HbA1C: No results for input(s): HGBA1C in the last 72 hours. CBG:  Recent Labs Lab 12/01/16 2327 12/02/16 0418 12/02/16 0831 12/02/16 1223 12/02/16 1714  GLUCAP 106* 87 94 99 82   Lipid Profile: No results for input(s): CHOL, HDL, LDLCALC, TRIG, CHOLHDL, LDLDIRECT in the last 72 hours. Thyroid Function Tests: No results for input(s): TSH, T4TOTAL, FREET4, T3FREE, THYROIDAB in the last 72 hours. Anemia Panel: No results for input(s): VITAMINB12, FOLATE, FERRITIN, TIBC, IRON, RETICCTPCT in the last 72 hours. Urine analysis:    Component Value Date/Time   COLORURINE YELLOW 10/05/2016 1807   APPEARANCEUR CLEAR 10/17/2016 1807   LABSPEC <1.005 (L) 11/01/2016 1807   PHURINE 7.0 10/24/2016 1807   GLUCOSEU NEGATIVE 11/01/2016 1807   HGBUR LARGE (A) 10/10/2016 1807   BILIRUBINUR NEGATIVE 10/23/2016 1807   KETONESUR NEGATIVE 10/31/2016 1807   PROTEINUR 30 (A) 10/20/2016 1807   UROBILINOGEN 1.0 11/13/2011 2010   NITRITE NEGATIVE 10/23/2016 1807   LEUKOCYTESUR NEGATIVE 11/01/2016 1807   Sepsis Labs: _0 (procalcitonin:4,lacticidven:4)  ) Recent Results (from the past 240 hour(s))  Body fluid culture     Status: None   Collection Time: 11/23/16  5:29 PM  Result Value Ref Range Status   Specimen Description FLUID PLEURAL LEFT  Final   Special Requests Normal  Final   Gram Stain NO WBC SEEN NO ORGANISMS SEEN   Final   Culture NO GROWTH 3 DAYS  Final   Report Status 11/27/2016 FINAL  Final         Radiology Studies: No results found.      Scheduled Meds: . amiodarone  200 mg Oral BID  . atorvastatin  20 mg Per Tube q1800  . budesonide (PULMICORT) nebulizer solution  0.5 mg Nebulization BID  . chlorhexidine gluconate (MEDLINE KIT)  15 mL Mouth Rinse BID  . clonazePAM  0.25 mg Per Tube Q6H  . famotidine  20 mg Per Tube BID  . fludrocortisone  0.1 mg Per Tube Daily  . folic acid  1 mg Per Tube Daily  . free water  200 mL Per Tube Q4H  . heparin   5,000 Units Subcutaneous Q8H  . hydrocortisone  10 mg Per Tube QHS  . hydrocortisone  5 mg Per Tube Daily  . ipratropium  0.5 mg Nebulization QID  . levalbuterol  0.63 mg Nebulization QID  . levETIRAcetam  1,500 mg Per Tube BID  . mouth rinse  15 mL Mouth Rinse QID  . metoprolol  10 mg Intravenous Q6H  . QUEtiapine  50 mg Per Tube BID  . thiamine  100 mg Per Tube Daily  . Valproate Sodium  750 mg Per Tube Q8H   Continuous Infusions: . feeding supplement (VITAL AF 1.2 CAL) 1,000 mL (12/02/16 0423)     LOS: 33 days    Time spent: 40 minutes    Hakan Nudelman, Geraldo Docker, MD [facility] Pager [phone]   If 7PM-7AM, please contact night-coverage [email or web address] Password [email or web address] 12/02/2016, 6:57 PM

## 2016-12-02 NOTE — Progress Notes (Signed)
CSW continuing to follow for disposition planning- [facility] until back at 28% FiO2  Per MD notes trying to discuss with family about care goals  Burna SisJenna H. Shanise Balch, LCSW Clinical Social Worker [phone]

## 2016-12-02 NOTE — Progress Notes (Signed)
Patient tolerating 35% ATC well without distress. Patient not placed back on ventilator at this time. RT will continue to monitor. Vent at bedside on stand-by.

## 2016-12-02 NOTE — Plan of Care (Signed)
Problem: Education: Goal: Knowledge of [place] [facility] Education information/materials will improve Outcome: Progressing Patient unable to be educated at this time. No evidence of learning. Patient family still has not met with palliative care.   Problem: Coping: Goal: Level of anxiety will decrease Outcome: Progressing Ativan effective in treatment of restlessness/anxiety for patient.   Problem: Nutritional: Goal: Intake of prescribed amount of daily calories will improve Outcome: Progressing Patient on tube feedings.  Problem: Respiratory: Goal: Ability to maintain a clear airway and adequate ventilation will improve Outcome: Progressing Patient with copious secretions. Requires suctioning by RN and RT. Remained on trach collar during Day Shift today.

## 2016-12-02 NOTE — Procedures (Signed)
Tracheostomy Change Note  Patient Details:   Name: Bonnita HollowCraig D Smaldone DOB: 12/01/1966 MRN: [id number]    Airway Documentation:     Evaluation  O2 sats: stable throughout Complications: No apparent complications Patient did tolerate procedure well. Bilateral Breath Sounds: Rhonchi  SPO2 93% on .40 ATC. Good color change with ETCO2. Pt tolerated well  Toula MoosCampbell, Miller Limehouse Faulkner 12/02/2016, 12:47 PM

## 2016-12-03 LAB — BASIC METABOLIC PANEL
ANION GAP: 11 (ref 5–15)
BUN: 11 mg/dL (ref 6–20)
CHLORIDE: 103 mmol/L (ref 101–111)
CO2: 33 mmol/L — ABNORMAL HIGH (ref 22–32)
Calcium: 8.8 mg/dL — ABNORMAL LOW (ref 8.9–10.3)
Creatinine, Ser: 0.63 mg/dL (ref 0.61–1.24)
GFR calc non Af Amer: >60 mL/min (ref >60)
Glucose, Bld: 92 mg/dL (ref 65–99)
POTASSIUM: 3 mmol/L — AB (ref 3.5–5.1)
SODIUM: 147 mmol/L — AB (ref 135–145)

## 2016-12-03 LAB — GLUCOSE, CAPILLARY
GLUCOSE-CAPILLARY: 101 mg/dL — AB (ref 65–99)
GLUCOSE-CAPILLARY: 101 mg/dL — AB (ref 65–99)
GLUCOSE-CAPILLARY: 87 mg/dL (ref 65–99)
GLUCOSE-CAPILLARY: 90 mg/dL (ref 65–99)
GLUCOSE-CAPILLARY: 97 mg/dL (ref 65–99)
GLUCOSE-CAPILLARY: 97 mg/dL (ref 65–99)
Glucose-Capillary: 83 mg/dL (ref 65–99)

## 2016-12-03 LAB — MAGNESIUM: MAGNESIUM: 1.7 mg/dL (ref 1.7–2.4)

## 2016-12-03 MED ORDER — FREE WATER
300.0000 mL | Status: DC
  Administered 2016-12-03 – 2016-12-16 (×79): 300 mL

## 2016-12-03 MED ORDER — POTASSIUM CHLORIDE 20 MEQ/15ML (10%) PO SOLN
60.0000 meq | Freq: Once | ORAL | Status: AC
  Administered 2016-12-03: 60 meq
  Filled 2016-12-03: qty 45

## 2016-12-03 MED ORDER — MAGNESIUM SULFATE 2 GM/50ML IV SOLN
2.0000 g | Freq: Once | INTRAVENOUS | Status: AC
  Administered 2016-12-03: 2 g via INTRAVENOUS
  Filled 2016-12-03: qty 50

## 2016-12-03 NOTE — Progress Notes (Signed)
PROGRESS NOTE    Dale [name]  OZH:[id number] DOB: July 15, 1966 DOA: 10/29/2016 PCP: No PCP Per Patient   Brief Narrative:  50 y.o. BM with PMHx Cerebral septic emboli ; Cerebral thrombosis with cerebral infarction (03/25/2016),  Convulsions/seizures  (05/04/2016); Dementia (04/27/2016); Depression; Dysphagia (05/04/2016); ETOH abuse;Tobacco abuse , Hydrocephalus Acute encephalopathy;Meningitis, Acute ischemic stroke, Cardiac arrest; Cardiogenic shock, NSTEMI,  Chronic Diastolic and Systolic CHF    Admission in March 2017 with strep pneumo meningitis c/b cardiac arrest and cardiogenic shock, ARDS requiring prolonged intubation and trach (now decannulated), and acute renal failure. He was admitted to [facility] 06/16/16 through 07/11/16 for recurrent hypoglycemia due to adrenal insufficiency, new subacute CVA, seizures with inability to protect airway s/p intubation 7/13 through 7/21. He re-presented 8/08 again with sepsis from UTI, and was intubated due to AMS, and was trached 8/18, and was discharged with a cuffless trach. That hospitalization was c/b MRSA bacteremia 8/24, and completed 3 weeks of IV vanc. He also developed C. Diff, and was treated with PO vanc through 9/11. He has had continued autonomic dysreflexia, which worsens during complications of acute illnesses.  Prior to this presentation, he had been well, although he was decannulated for reasons that are unclear. He was brought to the ED with decreased level of consciousness and nausea/vomiting/diarrhea. A CXR showed consolidation of the left upper lobe. He was intubated and transferred to [facility].  There have been significant amount of conversations with his wife with regard to goals of care, but she has insisted on full support. He is full code.   Subjective: 12/30   A/O x 0.  Mild w/d to pain open eyes when name called   Assessment & Plan:   Active Problems:   Adrenal insufficiency (Addison's disease) ([place])   Respiratory failure (HCC)  Renal failure   Pressure injury of skin   Acute respiratory failure with hypoxia (HCC)   ARDS (adult respiratory distress syndrome) (HCC)   Severe sepsis (HCC)   Chronic anoxic encephalopathy (HCC)   Systolic and diastolic CHF, chronic (HCC)   Mucus plugging of bronchi   Abdominal distension   Collapse of left lung   Encephalopathy chronic   Adrenal insufficiency (HCC)   Severe malnutrition (HCC)   Acute hypoxic respiratory failure secondary to Enterobacter HCAP w/ ARDS and Sepsis  Status post tracheostomy for the third time - trach care per PCCM with no plans to decannulate - sepsis and acute infection appear to have cleared - tolerating trach collar - SPO2 goal 93% -Left chest tube in place draining  mostly serous fluid.  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 -Mental status at his baseline   Transient Atrial fibrillation -currently NSR   Chronic Systolic and Diastolic CHF -EF 62% Aug 9528  - no evidence of volume overload at this time  -Strict in and out since admission + 6.6 L Filed Weights   12/01/16 0344 12/02/16 0417 12/03/16 0436  Weight: 68.5 kg (151 lb) 69.4 kg (153 lb) 66.7 kg (147 lb)  - weight is Relatively stable  Adrenal insufficiency  - 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   Hypernatremia -Increase to 300 ml QID    Hypokalemia -Potassium goal > 4  -Potassium  60 mEq  Hypomagnesemia -Magnesium goal > 2  -Magnesium IV 2 g  Severe malnutrition in context of chronic illness -Continue tube feeds per PEG   Goals of care - PALLIATIVE CARE: Review chart shows that the care family on 12/15 and family appears not wanted to engage with  palliative care until after the holidays.  -Review of chart shows that palliative care/LCSW have attempted on numerous occasions to contact wife and now wife states she is sick and cannot make a decision. May need to start proceedings on having a guardian appointed by the court      DVT prophylaxis: Subcutaneous heparin Code Status: Full Family Communication: None Disposition Plan: Ready for SNF When chest tube discontinued   Consultants:  [facility]  Procedures/Significant Events:  11/26 Intubated (endotracheally) at [facility] ED - transferred to [facility]  11/29 bronch - left inspissated pus   VENTILATOR SETTINGS: NA   Cultures   Antimicrobials: Anti-infectives    Start     Stop   11/25/16 1500  cefTRIAXone (ROCEPHIN) 1 g in dextrose 5 % 50 mL IVPB     11/25/16 1724   11/18/16 1500  cefTRIAXone (ROCEPHIN) 1 g in dextrose 5 % 50 mL IVPB  Status:  Discontinued     11/25/16 1015   11/11/16 2000  ciprofloxacin (CIPRO) tablet 750 mg     11/11/16 2010   11/16/2016 1600  ciprofloxacin (CIPRO) tablet 750 mg  Status:  Discontinued     11/11/16 0957   11/02/16 1400  cefTRIAXone (ROCEPHIN) 1 g in dextrose 5 % 50 mL IVPB  Status:  Discontinued     11/07/2016 1328   11/01/16 1000  vancomycin (VANCOCIN) IVPB 1000 mg/200 mL premix  Status:  Discontinued     11/03/16 1026   10/31/16 1600  vancomycin (VANCOCIN) IVPB 750 mg/150 ml premix  Status:  Discontinued     11/01/16 0818   10/31/16 0000  piperacillin-tazobactam (ZOSYN) IVPB 3.375 g  Status:  Discontinued     11/02/16 1254   10/09/2016 1600  piperacillin-tazobactam (ZOSYN) IVPB 3.375 g     10/16/2016 1637   10/17/2016 1600  vancomycin (VANCOCIN) IVPB 1000 mg/200 mL premix     10/21/2016 1731      Devices    LINES / TUBES:      Continuous Infusions: . feeding supplement (VITAL AF 1.2 CAL) 1,000 mL (12/02/16 2156)     Objective: Vitals:   12/03/16 0500 12/03/16 0600 12/03/16 0700 12/03/16 0835  BP: (!) 138/94 120/76 113/85 117/78    Pulse:  65 63 64  Resp:  (!) 23 (!) 21 (!) 24  Temp: 97.3 F (36.3 C)   (!) 95.8 F (35.4 C)  TempSrc: Oral   Axillary  SpO2:  100% 100% 100%  Weight:      Height:        Intake/Output Summary (Last 24 hours) at 12/03/16 0856 Last data filed at 12/03/16 0700  Gross per 24 hour  Intake             2800 ml  Output             3005 ml  Net             -205 ml   Filed Weights   12/01/16 0344 12/02/16 0417 12/03/16 0436  Weight: 68.5 kg (151 lb) 69.4 kg (153 lb) 66.7 kg (147 lb)    Examination:  General: No acute respiratory distress  Lungs: Clear to auscultation bilaterally Cardiovascular: Tachycardia  w/o gallup or rub   Abdomen: Nondistended, soft, bowel sounds positive Extremities: Stable trace edema B LE  .     Data Reviewed: Care during the described time interval was provided by me .  I have reviewed this patient's available data, including medical history, events of note, physical examination, and all test results as part of my evaluation. I have personally reviewed and interpreted all radiology studies.  CBC: No results for input(s): WBC, NEUTROABS, HGB, HCT, MCV, PLT in the last 168 hours. Basic Metabolic Panel:  Recent Labs Lab 11/28/16 0447 11/29/16 1106 12/03/16 0443  NA 145 144 147*  K 2.9* 3.8 3.0*  CL 101 101 103  CO2 39* 30 33*  GLUCOSE 89 76 92  BUN '10 11 11  '$ CREATININE 0.58* 0.65 0.63  CALCIUM 8.4* 8.8* 8.8*  MG 1.7 1.6* 1.7  PHOS  --  3.3  --    GFR: Estimated Creatinine Clearance: 101.9 mL/min (by C-G formula based on SCr of 0.63 mg/dL). Liver Function Tests: No results for input(s): AST, ALT, ALKPHOS, BILITOT, PROT, ALBUMIN in the last 168 hours. No results for input(s): LIPASE, AMYLASE in the last 168 hours. No results for input(s): AMMONIA in the last 168 hours. Coagulation Profile: No results for input(s): INR, PROTIME in the last 168 hours. Cardiac Enzymes: No results for input(s): CKTOTAL, CKMB, CKMBINDEX, TROPONINI in the last  168 hours. BNP (last 3 results) No results for input(s): PROBNP in the last 8760 hours. HbA1C: No results for input(s): HGBA1C in the last 72 hours. CBG:  Recent Labs Lab 12/02/16 1714 12/02/16 1939 12/03/16 0005 12/03/16 0340 12/03/16 0815  GLUCAP 82 82 101* 90 97   Lipid Profile: No results for input(s): CHOL, HDL, LDLCALC, TRIG, CHOLHDL, LDLDIRECT in the last 72 hours. Thyroid Function Tests: No results for input(s): TSH, T4TOTAL, FREET4, T3FREE, THYROIDAB in the last 72 hours. Anemia Panel: No results for input(s): VITAMINB12, FOLATE, FERRITIN, TIBC, IRON, RETICCTPCT in the last 72 hours. Urine analysis:    Component Value Date/Time   COLORURINE YELLOW 10/07/2016 1807   APPEARANCEUR CLEAR 10/15/2016 1807   LABSPEC <1.005 (L) 10/09/2016 1807   PHURINE 7.0 10/17/2016 1807   GLUCOSEU NEGATIVE 10/13/2016 1807   HGBUR LARGE (A) 10/10/2016 1807   BILIRUBINUR NEGATIVE 10/12/2016 1807   KETONESUR NEGATIVE 11/01/2016 1807   PROTEINUR 30 (A) 11/01/2016 1807   UROBILINOGEN 1.0 11/13/2011 2010   NITRITE NEGATIVE 10/12/2016 1807   LEUKOCYTESUR NEGATIVE 10/31/2016 1807   Sepsis Labs: '@[id number]'$ (procalcitonin:4,lacticidven:4)  ) Recent Results (from the past 240 hour(s))  Body fluid culture     Status: None   Collection Time: 11/23/16  5:29 PM  Result Value Ref Range Status   Specimen Description FLUID PLEURAL LEFT  Final   Special Requests Normal  Final   Gram Stain NO WBC SEEN NO ORGANISMS SEEN   Final   Culture NO GROWTH 3 DAYS  Final   Report Status 11/27/2016 FINAL  Final         Radiology Studies: No results found.      Scheduled Meds: . amiodarone  200 mg Oral BID  . atorvastatin  20 mg Per Tube q1800  . budesonide (PULMICORT) nebulizer solution  0.5 mg Nebulization BID  . chlorhexidine gluconate (MEDLINE KIT)  15 mL Mouth Rinse BID  . clonazePAM  0.25 mg Per Tube Q6H  . famotidine  20 mg Per Tube BID  . fludrocortisone  0.1 mg Per Tube Daily  .  folic acid  1  mg Per Tube Daily  . free water  200 mL Per Tube Q4H  . heparin  5,000 Units Subcutaneous Q8H  . hydrocortisone  10 mg Per Tube QHS  . hydrocortisone  5 mg Per Tube Daily  . ipratropium  0.5 mg Nebulization QID  . levalbuterol  0.63 mg Nebulization QID  . levETIRAcetam  1,500 mg Per Tube BID  . mouth rinse  15 mL Mouth Rinse QID  . metoprolol  10 mg Intravenous Q6H  . QUEtiapine  50 mg Per Tube BID  . thiamine  100 mg Per Tube Daily  . Valproate Sodium  750 mg Per Tube Q8H   Continuous Infusions: . feeding supplement (VITAL AF 1.2 CAL) 1,000 mL (12/02/16 2156)     LOS: 34 days    Time spent: 40 minutes    Devonte Migues, Geraldo Docker, MD [facility] Pager [phone]   If 7PM-7AM, please contact night-coverage [email or web address] Password [email or web address] 12/03/2016, 8:56 AM

## 2016-12-03 NOTE — Plan of Care (Signed)
Problem: Education: Goal: Knowledge of [facility] Education information/materials will improve Outcome: Progressing Patient's wife called today to check on him. Informed her that he is off the vent. She did not mention coming to visit or ask any other questions. Said thank you and hung up.   Problem: Coping: Goal: Level of anxiety will decrease Outcome: Progressing Patient anxiety managed today. Patient becomes restless when he needs to be cleaned, suctioned, or repositioned. Otherwise he rests.   Problem: Nutritional: Goal: Intake of prescribed amount of daily calories will improve Outcome: Completed/Met Date Met: 12/03/16 Patient on tube feedings.   Problem: Respiratory: Goal: Ability to maintain a clear airway and adequate ventilation will improve Outcome: Progressing Patient still on trach collar. Has been on trach collar for >24 hours. Requiring frequent suctioning.   Problem: Role Relationship: Goal: Method of communication will improve Outcome: Not Progressing Patient does not interact. Opens eyes to voice, but does not otherwise respond.   Problem: Skin Integrity Impairment Risk: Goal: Risk for impaired skin integrity will decrease Outcome: Progressing Patient on rotational air mattress. Turned every 2 hours. Protective skin care products to buttocks.  Goal: Evidence of further tissue damage will decrease Outcome: Progressing SCD's and prevalon boots in place.    

## 2016-12-04 LAB — GLUCOSE, CAPILLARY
GLUCOSE-CAPILLARY: 90 mg/dL (ref 65–99)
GLUCOSE-CAPILLARY: 83 mg/dL (ref 65–99)
GLUCOSE-CAPILLARY: 92 mg/dL (ref 65–99)
GLUCOSE-CAPILLARY: 93 mg/dL (ref 65–99)
Glucose-Capillary: 95 mg/dL (ref 65–99)

## 2016-12-04 LAB — MAGNESIUM: Magnesium: 1.8 mg/dL (ref 1.7–2.4)

## 2016-12-04 LAB — POTASSIUM: POTASSIUM: 2.9 mmol/L — AB (ref 3.5–5.1)

## 2016-12-04 NOTE — Progress Notes (Signed)
RT  Titrated FIO2 to 35%, 10L. Sat 96% on 40%.

## 2016-12-04 NOTE — Progress Notes (Signed)
RT increased O2 to 12 L, 40% due to decreased sat on 35% (91%).

## 2016-12-04 NOTE — Progress Notes (Signed)
PROGRESS NOTE    Dale [name]  OEU:[id number] DOB: 1966/08/16 DOA: 10/21/2016 PCP: No PCP Per Patient   Brief Narrative:  50 y.o. BM with PMHx Cerebral septic emboli ; Cerebral thrombosis with cerebral infarction (03/25/2016),  Convulsions/seizures  (05/04/2016); Dementia (04/27/2016); Depression; Dysphagia (05/04/2016); ETOH abuse;Tobacco abuse , Hydrocephalus Acute encephalopathy;Meningitis, Acute ischemic stroke, Cardiac arrest; Cardiogenic shock, NSTEMI,  Chronic Diastolic and Systolic CHF    Admission in March 2017 with strep pneumo meningitis c/b cardiac arrest and cardiogenic shock, ARDS requiring prolonged intubation and trach (now decannulated), and acute renal failure. He was admitted to [facility] 06/16/16 through 07/11/16 for recurrent hypoglycemia due to adrenal insufficiency, new subacute CVA, seizures with inability to protect airway s/p intubation 7/13 through 7/21. He re-presented 8/08 again with sepsis from UTI, and was intubated due to AMS, and was trached 8/18, and was discharged with a cuffless trach. That hospitalization was c/b MRSA bacteremia 8/24, and completed 3 weeks of IV vanc. He also developed C. Diff, and was treated with PO vanc through 9/11. He has had continued autonomic dysreflexia, which worsens during complications of acute illnesses.  Prior to this presentation, he had been well, although he was decannulated for reasons that are unclear. He was brought to the ED with decreased level of consciousness and nausea/vomiting/diarrhea. A CXR showed consolidation of the left upper lobe. He was intubated and transferred to [facility].  There have been significant amount of conversations with his wife with regard to goals of care, but she has insisted on full support. He is full code.   Subjective: 12/31  A/O x 0.  Mild w/d to pain     Assessment & Plan:   Active Problems:   Adrenal insufficiency (Addison's disease) ([place])   Respiratory failure (HCC)   Renal failure   Pressure  injury of skin   Acute respiratory failure with hypoxia (HCC)   ARDS (adult respiratory distress syndrome) (HCC)   Severe sepsis (HCC)   Chronic anoxic encephalopathy (HCC)   Systolic and diastolic CHF, chronic (HCC)   Mucus plugging of bronchi   Abdominal distension   Collapse of left lung   Encephalopathy chronic   Adrenal insufficiency (HCC)   Severe malnutrition (HCC)   Acute hypoxic respiratory failure secondary to Enterobacter HCAP w/ ARDS and Sepsis  Status post tracheostomy for the third time - trach care per PCCM with no plans to decannulate - sepsis and acute infection appear to have cleared - tolerating trach collar - SPO2 goal 93% -Left chest tube in place draining  mostly serous fluid. -12/31 left chest tube to waterseal: PCXR on 12/05/2016 if no pneumothorax will DC chest tube.  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 -Mental status at his baseline   Transient Atrial fibrillation -currently NSR   Chronic Systolic and Diastolic CHF -EF 15% Aug 4008  - no evidence of volume overload at this time  -Strict in and out since admission + 6.6 L  Filed Weights   12/02/16 0417 12/03/16 0436 12/04/16 0358  Weight: 69.4 kg (153 lb) 66.7 kg (147 lb) 66.7 kg (147 lb)  - weight is Relatively stable  Adrenal insufficiency  - 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   Hypernatremia -  Free water 300 ml QID    Hypokalemia -Potassium goal > 4   Hypomagnesemia -Magnesium goal > 2   Severe malnutrition in context of chronic illness -Continue tube feeds per PEG   Goals of care - PALLIATIVE CARE: Review chart shows that the care family on 12/15 and family appears  not wanted to engage with palliative care until after the holidays.  -Review of chart shows that palliative care/LCSW have attempted on numerous occasions to contact wife and now wife states she is sick and cannot make a decision. May need to start proceedings on having a guardian appointed by the court      DVT prophylaxis: Subcutaneous heparin Code Status: Full Family Communication: None Disposition Plan: Ready for SNF When chest tube discontinued   Consultants:  [facility]  Procedures/Significant Events:  11/26 Intubated (endotracheally) at [facility] ED - transferred to [facility]  11/29 bronch - left inspissated pus   VENTILATOR SETTINGS: NA   Cultures   Antimicrobials: Anti-infectives    Start     Stop   11/25/16 1500  cefTRIAXone (ROCEPHIN) 1 g in dextrose 5 % 50 mL IVPB     11/25/16 1724   11/18/16 1500  cefTRIAXone (ROCEPHIN) 1 g in dextrose 5 % 50 mL IVPB  Status:  Discontinued     11/25/16 1015   11/11/16 2000  ciprofloxacin (CIPRO) tablet 750 mg     11/11/16 2010   11/13/2016 1600  ciprofloxacin (CIPRO) tablet 750 mg  Status:  Discontinued     11/11/16 0957   11/02/16 1400  cefTRIAXone (ROCEPHIN) 1 g in dextrose 5 % 50 mL IVPB  Status:  Discontinued     11/26/2016 1328   11/01/16 1000  vancomycin (VANCOCIN) IVPB 1000 mg/200 mL premix  Status:  Discontinued     11/03/16 1026   10/31/16 1600  vancomycin (VANCOCIN) IVPB 750 mg/150 ml premix  Status:  Discontinued     11/01/16 0818   10/31/16 0000  piperacillin-tazobactam (ZOSYN) IVPB 3.375 g  Status:  Discontinued     11/02/16 1254   10/11/2016 1600  piperacillin-tazobactam (ZOSYN) IVPB 3.375 g     10/09/2016 1637   10/25/2016 1600  vancomycin (VANCOCIN) IVPB 1000 mg/200 mL premix     10/17/2016 1731      Devices    LINES / TUBES:  -Chest tube last 24 hours: 91m    Continuous Infusions: . feeding supplement (VITAL AF 1.2 CAL) 1,000 mL (12/03/16 1518)     Objective: Vitals:   12/04/16 0200 12/04/16 0345 12/04/16 0358  12/04/16 0400  BP: (!) 129/92 (!) 148/112  (!) 130/91  Pulse: 78   81  Resp: (!) 26   (!) 27  Temp:  98.6 F (37 C)    TempSrc:  Oral    SpO2: 100%   100%  Weight:   66.7 kg (147 lb)   Height:        Intake/Output Summary (Last 24 hours) at 12/04/16 0629 Last data filed at 12/04/16 0550  Gross per 24 hour  Intake             2850 ml  Output             2475 ml  Net              375 ml   Filed Weights   12/02/16 0417 12/03/16 0436 12/04/16 0358  Weight: 69.4 kg (153 lb) 66.7 kg (147 lb) 66.7 kg (147 lb)    Examination:  General: No acute respiratory distress  Lungs: Clear to auscultation bilaterally Cardiovascular: Renal rhythm and rate, negative murmurs rubs or gallops, w/o gallup or rub   Abdomen: Nondistended, soft, bowel sounds positive, PEG tube in place negative sign of infection Extremities: Stable trace edema B LE  .     Data Reviewed: Care during the described time interval was provided by me .  I have reviewed this patient's available data, including medical history, events of note, physical examination, and all test results as part of my evaluation. I have personally reviewed and interpreted all radiology studies.  CBC: No results for input(s): WBC, NEUTROABS, HGB, HCT, MCV, PLT in the last 168 hours. Basic Metabolic Panel:  Recent Labs Lab 11/28/16 0447 11/29/16 1106 12/03/16 0443  NA 145 144 147*  K 2.9* 3.8 3.0*  CL 101 101 103  CO2 39* 30 33*  GLUCOSE 89 76 92  BUN '10 11 11  '$ CREATININE 0.58* 0.65 0.63  CALCIUM 8.4* 8.8* 8.8*  MG 1.7 1.6* 1.7  PHOS  --  3.3  --    GFR: Estimated Creatinine Clearance: 101.9 mL/min (by C-G formula based on SCr of 0.63 mg/dL). Liver Function Tests: No results for input(s): AST, ALT, ALKPHOS, BILITOT, PROT, ALBUMIN in the last 168 hours. No results for input(s): LIPASE, AMYLASE in the last 168 hours. No results for input(s): AMMONIA in the last 168 hours. Coagulation Profile: No results for input(s): INR,  PROTIME in the last 168 hours. Cardiac Enzymes: No results for input(s): CKTOTAL, CKMB, CKMBINDEX, TROPONINI in the last 168 hours. BNP (last 3 results) No results for input(s): PROBNP in the last 8760 hours. HbA1C: No results for input(s): HGBA1C in the last 72 hours. CBG:  Recent Labs Lab 12/03/16 1206 12/03/16 1645 12/03/16 1955 12/03/16 2331 12/04/16 0343  GLUCAP 97 101* 83 87 90   Lipid Profile: No results for input(s): CHOL, HDL, LDLCALC, TRIG, CHOLHDL, LDLDIRECT in the last 72 hours. Thyroid Function Tests: No results for input(s): TSH, T4TOTAL, FREET4, T3FREE, THYROIDAB in the last 72 hours. Anemia Panel: No results for input(s): VITAMINB12, FOLATE, FERRITIN, TIBC, IRON, RETICCTPCT in the last 72 hours. Urine analysis:    Component Value Date/Time   COLORURINE YELLOW 10/15/2016 1807   APPEARANCEUR CLEAR 10/12/2016 1807   LABSPEC <1.005 (L) 10/24/2016 1807   PHURINE 7.0 10/09/2016 1807   GLUCOSEU NEGATIVE 11/02/2016 1807   HGBUR LARGE (A) 10/24/2016 1807   BILIRUBINUR NEGATIVE 10/23/2016 1807   KETONESUR NEGATIVE 10/25/2016 1807   PROTEINUR 30 (A) 10/19/2016 1807   UROBILINOGEN 1.0 11/13/2011 2010   NITRITE NEGATIVE 10/26/2016 1807   LEUKOCYTESUR NEGATIVE 10/09/2016 1807   Sepsis Labs: '@LABRCNTIP'$ (procalcitonin:4,lacticidven:4)  ) No results found for this or any previous visit (from the past 240 hour(s)).       Radiology Studies: No results found.      Scheduled Meds: . amiodarone  200 mg Oral BID  . atorvastatin  20 mg Per Tube q1800  . budesonide (PULMICORT) nebulizer solution  0.5 mg Nebulization BID  . chlorhexidine gluconate (MEDLINE KIT)  15 mL Mouth Rinse BID  . clonazePAM  0.25 mg Per Tube Q6H  . famotidine  20 mg Per Tube BID  . fludrocortisone  0.1 mg Per Tube Daily  . folic acid  1 mg Per Tube Daily  . free water  300 mL Per Tube Q4H  . heparin  5,000 Units Subcutaneous Q8H  . hydrocortisone  10 mg Per Tube QHS  . hydrocortisone   5  mg Per Tube Daily  . ipratropium  0.5 mg Nebulization QID  . levalbuterol  0.63 mg Nebulization QID  . levETIRAcetam  1,500 mg Per Tube BID  . mouth rinse  15 mL Mouth Rinse QID  . metoprolol  10 mg Intravenous Q6H  . QUEtiapine  50 mg Per Tube BID  . thiamine  100 mg Per Tube Daily  . Valproate Sodium  750 mg Per Tube Q8H   Continuous Infusions: . feeding supplement (VITAL AF 1.2 CAL) 1,000 mL (12/03/16 1518)     LOS: 35 days    Time spent: 40 minutes    WOODS, Geraldo Docker, MD [facility] Pager [phone]   If 7PM-7AM, please contact night-coverage [email or web address] Password [facility] 12/04/2016, 6:29 AM

## 2016-12-04 NOTE — Plan of Care (Signed)
Problem: Coping: Goal: Level of anxiety will decrease Outcome: Completed/Met Date Met: 12/04/16 Patient no longer on ventilator.

## 2016-12-05 ENCOUNTER — Inpatient Hospital Stay (HOSPITAL_COMMUNITY): Payer: Medicaid Other

## 2016-12-05 LAB — GLUCOSE, CAPILLARY
GLUCOSE-CAPILLARY: 101 mg/dL — AB (ref 65–99)
GLUCOSE-CAPILLARY: 104 mg/dL — AB (ref 65–99)
GLUCOSE-CAPILLARY: 95 mg/dL (ref 65–99)
GLUCOSE-CAPILLARY: 97 mg/dL (ref 65–99)
GLUCOSE-CAPILLARY: 99 mg/dL (ref 65–99)
Glucose-Capillary: 109 mg/dL — ABNORMAL HIGH (ref 65–99)
Glucose-Capillary: 93 mg/dL (ref 65–99)

## 2016-12-05 MED ORDER — POTASSIUM CHLORIDE 20 MEQ/15ML (10%) PO SOLN
20.0000 meq | Freq: Two times a day (BID) | ORAL | Status: AC
  Administered 2016-12-05 (×2): 20 meq via ORAL
  Filled 2016-12-05 (×2): qty 15

## 2016-12-05 MED ORDER — POTASSIUM CHLORIDE 2 MEQ/ML IV SOLN
INTRAVENOUS | Status: DC
  Administered 2016-12-05 – 2016-12-07 (×4): via INTRAVENOUS
  Filled 2016-12-05 (×4): qty 1000

## 2016-12-05 NOTE — Progress Notes (Signed)
PROGRESS NOTE    Dale [name]  USC:[id number] DOB: 1965-12-26 DOA: 10/31/2016 PCP: No PCP Per Patient   Brief Narrative:  51 y.o. BM with PMHx Cerebral septic emboli ; Cerebral thrombosis with cerebral infarction (03/25/2016),  Convulsions/seizures  (05/04/2016); Dementia (04/27/2016); Depression; Dysphagia (05/04/2016); ETOH abuse;Tobacco abuse , Hydrocephalus Acute encephalopathy;Meningitis, Acute ischemic stroke, Cardiac arrest; Cardiogenic shock, NSTEMI,  Chronic Diastolic and Systolic CHF    Admission in March 2017 with strep pneumo meningitis c/b cardiac arrest and cardiogenic shock, ARDS requiring prolonged intubation and trach (now decannulated), and acute renal failure. He was admitted to [facility] 06/16/16 through 07/11/16 for recurrent hypoglycemia due to adrenal insufficiency, new subacute CVA, seizures with inability to protect airway s/p intubation 7/13 through 7/21. He re-presented 8/08 again with sepsis from UTI, and was intubated due to AMS, and was trached 8/18, and was discharged with a cuffless trach. That hospitalization was c/b MRSA bacteremia 8/24, and completed 3 weeks of IV vanc. He also developed C. Diff, and was treated with PO vanc through 9/11. He has had continued autonomic dysreflexia, which worsens during complications of acute illnesses.  Prior to this presentation, he had been well, although he was decannulated for reasons that are unclear. He was brought to the ED with decreased level of consciousness and nausea/vomiting/diarrhea. A CXR showed consolidation of the left upper lobe. He was intubated and transferred to [facility].  There have been significant amount of conversations with his wife with regard to goals of care, but she has insisted on full support. He is full code.   Subjective:   Patient is awake, responsive, currently on 35% FiO2   Assessment & Plan:   Active Problems:   Adrenal insufficiency (Addison's disease) (HCC)   Respiratory failure (HCC)   Renal  failure   Pressure injury of skin   Acute respiratory failure with hypoxia (HCC)   ARDS (adult respiratory distress syndrome) (HCC)   Severe sepsis (HCC)   Chronic anoxic encephalopathy (HCC)   Systolic and diastolic CHF, chronic (HCC)   Mucus plugging of bronchi   Abdominal distension   Collapse of left lung   Encephalopathy chronic   Adrenal insufficiency (HCC)   Severe malnutrition (HCC)   Acute hypoxic respiratory failure secondary to Enterobacter HCAP w/ ARDS and Sepsis  Status post tracheostomy for the third time - trach care per PCCM with no plans to decannulate - sepsis and acute infection appear to have cleared - tolerating trach collar - SPO2 goal 93% -Left chest tube in place draining  mostly serous fluid. -12/31 left chest tube to waterseal: Chest x-ray 12/05/16 shows new hazy airspace opacity in the right lung, persistent opacities in the left lung, no pneumothorax. Hold off on antibiotics for now and monitor for fever and pulmonary symptoms. Strict aspiration precautions d/c chest tube soon, once output drops < 50cc/day. 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 -Mental status at his baseline   Transient Atrial fibrillation -currently NSR   Chronic Systolic and Diastolic CHF -EF 18% Aug 2017  - no evidence of volume overload at this time  -Strict in and out since admission + 6.6 L  Filed Weights   12/03/16 0436 12/04/16 0358  12/05/16 0500  Weight: 66.7 kg (147 lb) 66.7 kg (147 lb) 68.9 kg (152 lb)  - weight is Relatively stable    Adrenal insufficiency  - 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   Hypernatremia -Free water 300  ml QID    Hypokalemia -Potassium goal > 4   Hypomagnesemia -Magnesium goal > 2   Severe malnutrition in context of chronic illness -Continue tube feeds per PEG   Goals of care - PALLIATIVE CARE: Review chart shows that the care family on 12/15 and family appears not wanted to engage with palliative care until after the holidays.  -Review of chart shows that palliative care/LCSW have attempted on numerous occasions to contact wife and now wife states she is sick and cannot make a decision. May need to start proceedings on having a guardian appointed by the court      DVT prophylaxis: Subcutaneous heparin Code Status: Full Family Communication: None Disposition Plan: Ready for SNF When chest tube discontinued   Consultants:  [facility] M  Procedures/Significant Events:  11/26 Intubated (endotracheally) at [facility] ED - [facility] to [facility]  11/29 bronch - left inspissated pus   VENTILATOR SETTINGS: NA   Cultures   Antimicrobials: Anti-infectives    Start     Stop   11/25/16 1500  cefTRIAXone (ROCEPHIN) 1 g in dextrose 5 % 50 mL IVPB     11/25/16 1724   11/18/16 1500  cefTRIAXone (ROCEPHIN) 1 g in dextrose 5 % 50 mL IVPB  Status:  Discontinued     11/25/16 1015   11/11/16 2000  ciprofloxacin (CIPRO) tablet 750 mg     11/11/16 2010   11/30/2016 1600  ciprofloxacin (CIPRO) tablet 750 mg  Status:  Discontinued     11/11/16 0957   11/02/16 1400  cefTRIAXone (ROCEPHIN) 1 g in dextrose 5 % 50 mL IVPB  Status:  Discontinued     12/04/2016 1328   11/01/16 1000  vancomycin (VANCOCIN) IVPB 1000 mg/200 mL premix  Status:  Discontinued     11/03/16 1026   10/31/16 1600  vancomycin (VANCOCIN) IVPB 750 mg/150 ml premix  Status:  Discontinued     11/01/16 0818   10/31/16 0000  piperacillin-tazobactam (ZOSYN) IVPB 3.375 g  Status:  Discontinued     11/02/16 1254   10/11/2016 1600  piperacillin-tazobactam (ZOSYN) IVPB 3.375 g     10/16/2016 1637   10/29/2016 1600  vancomycin (VANCOCIN) IVPB 1000  mg/200 mL premix     10/17/2016 1731      Devices    LINES / TUBES:  -Chest tube last 24 hours: 55m    Continuous Infusions: . feeding supplement (VITAL AF 1.2 CAL) 1,000 mL (12/05/16 0018)     Objective: Vitals:   12/05/16 0700 12/05/16 0800 12/05/16 0829 12/05/16 0830  BP: (!) 134/91 132/86    Pulse: 61 61    Resp: (!) 23 (!) 26    Temp:      TempSrc:      SpO2: 99% 98% 98% 98%  Weight:      Height:        Intake/Output Summary (Last 24 hours) at 12/05/16 1147 Last data filed at 12/05/16 0800  Gross per 24 hour  Intake             3600 ml  Output             1968 ml  Net             1632 ml  Filed Weights   12/03/16 0436 12/04/16 0358 12/05/16 0500  Weight: 66.7 kg (147 lb) 66.7 kg (147 lb) 68.9 kg (152 lb)    Examination:  General: No acute respiratory distress  Lungs: Clear to auscultation bilaterally Cardiovascular: Renal rhythm and rate, negative murmurs rubs or gallops, w/o gallup or rub   Abdomen: Nondistended, soft, bowel sounds positive, PEG tube in place negative sign of infection Extremities: Stable trace edema B LE  .     Data Reviewed: Care during the described time interval was provided by me .  I have reviewed this patient's available data, including medical history, events of note, physical examination, and all test results as part of my evaluation. I have personally reviewed and interpreted all radiology studies.  CBC: No results for input(s): WBC, NEUTROABS, HGB, HCT, MCV, PLT in the last 168 hours. Basic Metabolic Panel:  Recent Labs Lab 11/29/16 1106 12/03/16 0443 12/04/16 0800  NA 144 147*  --   K 3.8 3.0* 2.9*  CL 101 103  --   CO2 30 33*  --   GLUCOSE 76 92  --   BUN 11 11  --   CREATININE 0.65 0.63  --   CALCIUM 8.8* 8.8*  --   MG 1.6* 1.7 1.8  PHOS 3.3  --   --    GFR: Estimated Creatinine Clearance: 104.5 mL/min (by C-G formula based on SCr of 0.63 mg/dL). Liver Function Tests: No results for input(s): AST, ALT,  ALKPHOS, BILITOT, PROT, ALBUMIN in the last 168 hours. No results for input(s): LIPASE, AMYLASE in the last 168 hours. No results for input(s): AMMONIA in the last 168 hours. Coagulation Profile: No results for input(s): INR, PROTIME in the last 168 hours. Cardiac Enzymes: No results for input(s): CKTOTAL, CKMB, CKMBINDEX, TROPONINI in the last 168 hours. BNP (last 3 results) No results for input(s): PROBNP in the last 8760 hours. HbA1C: No results for input(s): HGBA1C in the last 72 hours. CBG:  Recent Labs Lab 12/04/16 1648 12/04/16 1945 12/05/16 0027 12/05/16 0334 12/05/16 0804  GLUCAP 92 93 101* 97 104*   Lipid Profile: No results for input(s): CHOL, HDL, LDLCALC, TRIG, CHOLHDL, LDLDIRECT in the last 72 hours. Thyroid Function Tests: No results for input(s): TSH, T4TOTAL, FREET4, T3FREE, THYROIDAB in the last 72 hours. Anemia Panel: No results for input(s): VITAMINB12, FOLATE, FERRITIN, TIBC, IRON, RETICCTPCT in the last 72 hours. Urine analysis:    Component Value Date/Time   COLORURINE YELLOW 10/13/2016 1807   APPEARANCEUR CLEAR 10/21/2016 1807   LABSPEC <1.005 (L) 10/22/2016 1807   PHURINE 7.0 10/29/2016 1807   GLUCOSEU NEGATIVE 10/18/2016 1807   HGBUR LARGE (A) 10/10/2016 1807   BILIRUBINUR NEGATIVE 10/13/2016 1807   KETONESUR NEGATIVE 10/16/2016 1807   PROTEINUR 30 (A) 10/16/2016 1807   UROBILINOGEN 1.0 11/13/2011 2010   NITRITE NEGATIVE 10/05/2016 1807   LEUKOCYTESUR NEGATIVE 10/09/2016 1807   Sepsis Labs: '@LABRCNTIP'$ (procalcitonin:4,lacticidven:4)  ) No results found for this or any previous visit (from the past 240 hour(s)).       Radiology Studies: Dg Chest Port 1 View  Result Date: 12/05/2016 CLINICAL DATA:  Followup exam for pleural effusion in possible pneumothorax. Left chest tube. EXAM: PORTABLE CHEST 1 VIEW COMPARISON:  11/23/2016 FINDINGS: Left chest tube is stable with its tip at the left apex. No pneumothorax. Airspace consolidation is  noted on the left most apparent peripherally. There is a small residual left pleural effusion. Hazy airspace opacity has developed throughout most of the right lung.  Probable small right pleural effusion. Right PICC and tracheostomy tube are stable and well positioned. IMPRESSION: 1. Worsened lung aeration on the right with new hazy airspace opacity throughout most of the right lung. Probable small right pleural effusion. 2. Persistent airspace opacities in the left lung with a small residual pleural effusion. 3. Stable chest tube.  No pneumothorax. Electronically Signed   By: Lajean Manes M.D.   On: 12/05/2016 07:22        Scheduled Meds: . amiodarone  200 mg Oral BID  . atorvastatin  20 mg Per Tube q1800  . budesonide (PULMICORT) nebulizer solution  0.5 mg Nebulization BID  . chlorhexidine gluconate (MEDLINE KIT)  15 mL Mouth Rinse BID  . clonazePAM  0.25 mg Per Tube Q6H  . famotidine  20 mg Per Tube BID  . fludrocortisone  0.1 mg Per Tube Daily  . folic acid  1 mg Per Tube Daily  . free water  300 mL Per Tube Q4H  . heparin  5,000 Units Subcutaneous Q8H  . hydrocortisone  10 mg Per Tube QHS  . hydrocortisone  5 mg Per Tube Daily  . ipratropium  0.5 mg Nebulization QID  . levalbuterol  0.63 mg Nebulization QID  . levETIRAcetam  1,500 mg Per Tube BID  . mouth rinse  15 mL Mouth Rinse QID  . metoprolol  10 mg Intravenous Q6H  . QUEtiapine  50 mg Per Tube BID  . thiamine  100 mg Per Tube Daily  . Valproate Sodium  750 mg Per Tube Q8H   Continuous Infusions: . feeding supplement (VITAL AF 1.2 CAL) 1,000 mL (12/05/16 0018)     LOS: 36 days    Time spent: 63 minutes    Chia Mowers, MD [facility]    If 7PM-7AM, please contact night-coverage [email or web address] Password [email or web address] 12/05/2016, 11:47 AM

## 2016-12-05 DEATH — deceased

## 2016-12-06 DIAGNOSIS — Z978 Presence of other specified devices: Secondary | ICD-10-CM

## 2016-12-06 DIAGNOSIS — Z9689 Presence of other specified functional implants: Secondary | ICD-10-CM

## 2016-12-06 LAB — COMPREHENSIVE METABOLIC PANEL
ALBUMIN: 1.8 g/dL — AB (ref 3.5–5.0)
ALK PHOS: 54 U/L (ref 38–126)
ALT: 13 U/L — AB (ref 17–63)
AST: 20 U/L (ref 15–41)
Anion gap: 10 (ref 5–15)
BILIRUBIN TOTAL: 0.3 mg/dL (ref 0.3–1.2)
BUN: 12 mg/dL (ref 6–20)
CO2: 30 mmol/L (ref 22–32)
CREATININE: 0.64 mg/dL (ref 0.61–1.24)
Calcium: 8.6 mg/dL — ABNORMAL LOW (ref 8.9–10.3)
Chloride: 100 mmol/L — ABNORMAL LOW (ref 101–111)
GFR calc Af Amer: >60 mL/min (ref >60)
GFR calc non Af Amer: >60 mL/min (ref >60)
GLUCOSE: 103 mg/dL — AB (ref 65–99)
Potassium: 3.4 mmol/L — ABNORMAL LOW (ref 3.5–5.1)
SODIUM: 140 mmol/L (ref 135–145)
Total Protein: 6.9 g/dL (ref 6.5–8.1)

## 2016-12-06 LAB — CBC
HEMATOCRIT: 26.9 % — AB (ref 39.0–52.0)
HEMOGLOBIN: 8.4 g/dL — AB (ref 13.0–17.0)
MCH: 28.6 pg (ref 26.0–34.0)
MCHC: 31.2 g/dL (ref 30.0–36.0)
MCV: 91.5 fL (ref 78.0–100.0)
Platelets: 371 10*3/uL (ref 150–400)
RBC: 2.94 MIL/uL — AB (ref 4.22–5.81)
RDW: 18.9 % — ABNORMAL HIGH (ref 11.5–15.5)
WBC: 13.6 10*3/uL — ABNORMAL HIGH (ref 4.0–10.5)

## 2016-12-06 LAB — GLUCOSE, CAPILLARY
Glucose-Capillary: 109 mg/dL — ABNORMAL HIGH (ref 65–99)
Glucose-Capillary: 90 mg/dL (ref 65–99)
Glucose-Capillary: 92 mg/dL (ref 65–99)
Glucose-Capillary: 80 mg/dL (ref 65–99)
Glucose-Capillary: 88 mg/dL (ref 65–99)

## 2016-12-06 MED ORDER — POTASSIUM CHLORIDE 20 MEQ/15ML (10%) PO SOLN
60.0000 meq | Freq: Once | ORAL | Status: AC
  Administered 2016-12-06: 60 meq
  Filled 2016-12-06: qty 45

## 2016-12-06 NOTE — Plan of Care (Signed)
Problem: Skin Integrity: Goal: Risk for impaired skin integrity will decrease Outcome: Progressing Protected skin area under chin for sacrum foam dressing to prevent breakdown with no teaching displayed at this time.

## 2016-12-06 NOTE — Progress Notes (Signed)
PROGRESS NOTE    Dale [name]  KZS:[id number] DOB: [birth year] DOA: 10/27/2016 PCP: No PCP Per Patient   Brief Narrative:  51 y.o. BM with PMHx Cerebral septic emboli ; Cerebral thrombosis with cerebral infarction (03/25/2016),  Convulsions/seizures  (05/04/2016); Dementia (04/27/2016); Depression; Dysphagia (05/04/2016); ETOH abuse;Tobacco abuse , Hydrocephalus Acute encephalopathy;Meningitis, Acute ischemic stroke, Cardiac arrest; Cardiogenic shock, NSTEMI,  Chronic Diastolic and Systolic CHF    Admission in March 2017 with strep pneumo meningitis c/b cardiac arrest and cardiogenic shock, ARDS requiring prolonged intubation and trach (now decannulated), and acute renal failure. He was admitted to [facility] 06/16/16 through 07/11/16 for recurrent hypoglycemia due to adrenal insufficiency, new subacute CVA, seizures with inability to protect airway s/p intubation 7/13 through 7/21. He re-presented 8/08 again with sepsis from UTI, and was intubated due to AMS, and was trached 8/18, and was discharged with a cuffless trach. That hospitalization was c/b MRSA bacteremia 8/24, and completed 3 weeks of IV vanc. He also developed C. Diff, and was treated with PO vanc through 9/11. He has had continued autonomic dysreflexia, which worsens during complications of acute illnesses.  Prior to this presentation, he had been well, although he was decannulated for reasons that are unclear. He was brought to the ED with decreased level of consciousness and nausea/vomiting/diarrhea. A CXR showed consolidation of the left upper lobe. He was intubated and transferred to [facility].  There have been significant amount of conversations with his wife with regard to goals of care, but she has insisted on full support. He is full code.   Subjective: 1/2  A/O x 0.  Mild w/d to pain eyes open    Assessment & Plan:   Active Problems:   Adrenal insufficiency (Addison's disease) (HCC)   Respiratory failure (HCC)   Renal failure  Pressure injury of skin   Acute respiratory failure with hypoxia (HCC)   ARDS (adult respiratory distress syndrome) (HCC)   Severe sepsis (HCC)   Chronic anoxic encephalopathy (HCC)   Systolic and diastolic CHF, chronic (HCC)   Mucus plugging of bronchi   Abdominal distension   Collapse of left lung   Encephalopathy chronic   Adrenal insufficiency (HCC)   Severe malnutrition (HCC)   Acute hypoxic respiratory failure secondary to Enterobacter HCAP w/ ARDS and Sepsis  Status post tracheostomy for the third time - trach care per PCCM with no plans to decannulate - sepsis and acute infection appear to have cleared - tolerating trach collar - SPO2 goal 93% -Left chest tube in place draining  mostly serous fluid. -1/2 left chest tube to waterseal: PCXR on 12/07/2016 if no pneumothorax DC CHEST TUBE.  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 -Mental status at baseline   Transient Atrial fibrillation -currently NSR   Chronic Systolic and Diastolic CHF -EF 73% Aug 2017  - no evidence of volume overload at this time  -Strict in and out since admission + 6.9 L  Filed Weights   12/04/16 0358 12/05/16 0500 12/06/16 0358  Weight: 66.7 kg (147 lb) 68.9 kg (152 lb) 68.9 kg (152 lb)  - weight is Relatively stable  Adrenal insufficiency  - 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   Hypernatremia -Free water 300  ml QID    Hypokalemia -Potassium goal > 4  -Potassium 60 mEq  Hypomagnesemia -Magnesium goal > 2   Severe malnutrition in context of chronic illness -Continue tube feeds per PEG   Goals of care - PALLIATIVE CARE: Review chart shows that the care family on 12/15 and  family appears not wanted to engage with palliative care until after the holidays.  -Review of chart shows that palliative care/LCSW have attempted on numerous occasions to contact wife and now wife states she is sick and cannot make a decision. May need to start proceedings on having a guardian appointed by the court      DVT prophylaxis: Subcutaneous heparin Code Status: Full Family Communication: None Disposition Plan: Ready for SNF When chest tube discontinued   Consultants:  [facility]  Procedures/Significant Events:  11/26 Intubated (endotracheally) at [facility] ED - transferred to [facility]  11/29 bronch - left inspissated pus   VENTILATOR SETTINGS: NA   Cultures   Antimicrobials: Anti-infectives    Start     Stop   11/25/16 1500  cefTRIAXone (ROCEPHIN) 1 g in dextrose 5 % 50 mL IVPB     11/25/16 1724   11/18/16 1500  cefTRIAXone (ROCEPHIN) 1 g in dextrose 5 % 50 mL IVPB  Status:  Discontinued     11/25/16 1015   11/11/16 2000  ciprofloxacin (CIPRO) tablet 750 mg     11/11/16 2010   11/14/2016 1600  ciprofloxacin (CIPRO) tablet 750 mg  Status:  Discontinued     11/11/16 0957   11/02/16 1400  cefTRIAXone (ROCEPHIN) 1 g in dextrose 5 % 50 mL IVPB  Status:  Discontinued     11/24/2016 1328   11/01/16 1000  vancomycin (VANCOCIN) IVPB 1000 mg/200 mL premix  Status:  Discontinued     11/03/16 1026   10/31/16 1600  vancomycin (VANCOCIN) IVPB 750 mg/150 ml premix  Status:  Discontinued     11/01/16 0818   10/31/16 0000  piperacillin-tazobactam (ZOSYN) IVPB 3.375 g  Status:  Discontinued     11/02/16 1254   10/16/2016 1600  piperacillin-tazobactam (ZOSYN) IVPB 3.375 g     10/14/2016 1637   11/03/2016 1600  vancomycin (VANCOCIN) IVPB 1000 mg/200 mL premix     10/07/2016 1731      Devices    LINES / TUBES:  -Chest tube last 24 hours: 43m    Continuous Infusions: . feeding supplement (VITAL AF 1.2 CAL) 1,000 mL (12/06/16 0600)  . sodium chloride 0.45 % with kcl 50 mL/hr at 12/06/16  0600     Objective: Vitals:   12/06/16 0405 12/06/16 0500 12/06/16 0600 12/06/16 0804  BP:  (!) 154/89 (!) 140/104 (!) 139/94  Pulse: 71 71    Resp: (!) 30 (!) 26 (!) 30   Temp:      TempSrc:      SpO2: 98% 100%  (!) 87%  Weight:      Height:        Intake/Output Summary (Last 24 hours) at 12/06/16 0836 Last data filed at 12/06/16 0810  Gross per 24 hour  Intake          3109.17 ml  Output             2910 ml  Net           199.17 ml   Filed Weights   12/04/16 0358 12/05/16 0500 12/06/16 0358  Weight: 66.7 kg (147 lb) 68.9 kg (152 lb) 68.9 kg (152 lb)  Examination:  General: No acute respiratory distress  Lungs: Rhonchorous bilaterally Cardiovascular: Renal rhythm and rate, negative murmurs rubs or gallops, w/o gallup or rub   Abdomen: Nondistended, soft, bowel sounds positive, PEG tube in place negative sign of infection Extremities: Stable trace edema B LE  .     Data Reviewed: Care during the described time interval was provided by me .  I have reviewed this patient's available data, including medical history, events of note, physical examination, and all test results as part of my evaluation. I have personally reviewed and interpreted all radiology studies.  CBC:  Recent Labs Lab 12/06/16 0405  WBC 13.6*  HGB 8.4*  HCT 26.9*  MCV 91.5  PLT 413   Basic Metabolic Panel:  Recent Labs Lab 11/29/16 1106 12/03/16 0443 12/04/16 0800 12/06/16 0405  NA 144 147*  --  140  K 3.8 3.0* 2.9* 3.4*  CL 101 103  --  100*  CO2 30 33*  --  30  GLUCOSE 76 92  --  103*  BUN 11 11  --  12  CREATININE 0.65 0.63  --  0.64  CALCIUM 8.8* 8.8*  --  8.6*  MG 1.6* 1.7 1.8  --   PHOS 3.3  --   --   --    GFR: Estimated Creatinine Clearance: 104.5 mL/min (by C-G formula based on SCr of 0.64 mg/dL). Liver Function Tests:  Recent Labs Lab 12/06/16 0405  AST 20  ALT 13*  ALKPHOS 54  BILITOT 0.3  PROT 6.9  ALBUMIN 1.8*   No results for input(s): LIPASE,  AMYLASE in the last 168 hours. No results for input(s): AMMONIA in the last 168 hours. Coagulation Profile: No results for input(s): INR, PROTIME in the last 168 hours. Cardiac Enzymes: No results for input(s): CKTOTAL, CKMB, CKMBINDEX, TROPONINI in the last 168 hours. BNP (last 3 results) No results for input(s): PROBNP in the last 8760 hours. HbA1C: No results for input(s): HGBA1C in the last 72 hours. CBG:  Recent Labs Lab 12/05/16 1600 12/05/16 2016 12/05/16 2337 12/06/16 0356 12/06/16 0757  GLUCAP 95 93 109* 109* 90   Lipid Profile: No results for input(s): CHOL, HDL, LDLCALC, TRIG, CHOLHDL, LDLDIRECT in the last 72 hours. Thyroid Function Tests: No results for input(s): TSH, T4TOTAL, FREET4, T3FREE, THYROIDAB in the last 72 hours. Anemia Panel: No results for input(s): VITAMINB12, FOLATE, FERRITIN, TIBC, IRON, RETICCTPCT in the last 72 hours. Urine analysis:    Component Value Date/Time   COLORURINE YELLOW 10/21/2016 1807   APPEARANCEUR CLEAR 10/21/2016 1807   LABSPEC <1.005 (L) 10/29/2016 1807   PHURINE 7.0 10/23/2016 1807   GLUCOSEU NEGATIVE 10/12/2016 1807   HGBUR LARGE (A) 10/26/2016 1807   BILIRUBINUR NEGATIVE 10/21/2016 1807   KETONESUR NEGATIVE 10/25/2016 1807   PROTEINUR 30 (A) 10/26/2016 1807   UROBILINOGEN 1.0 11/13/2011 2010   NITRITE NEGATIVE 10/17/2016 1807   LEUKOCYTESUR NEGATIVE 10/14/2016 1807   Sepsis Labs: '@LABRCNTIP'$ (procalcitonin:4,lacticidven:4)  ) No results found for this or any previous visit (from the past 240 hour(s)).       Radiology Studies: Dg Chest Port 1 View  Result Date: 12/05/2016 CLINICAL DATA:  Followup exam for pleural effusion in possible pneumothorax. Left chest tube. EXAM: PORTABLE CHEST 1 VIEW COMPARISON:  11/23/2016 FINDINGS: Left chest tube is stable with its tip at the left apex. No pneumothorax. Airspace consolidation is noted on the left most apparent peripherally. There is a small residual left pleural  effusion. Hazy airspace opacity has developed  throughout most of the right lung. Probable small right pleural effusion. Right PICC and tracheostomy tube are stable and well positioned. IMPRESSION: 1. Worsened lung aeration on the right with new hazy airspace opacity throughout most of the right lung. Probable small right pleural effusion. 2. Persistent airspace opacities in the left lung with a small residual pleural effusion. 3. Stable chest tube.  No pneumothorax. Electronically Signed   By: Lajean Manes M.D.   On: 12/05/2016 07:22        Scheduled Meds: . amiodarone  200 mg Oral BID  . atorvastatin  20 mg Per Tube q1800  . budesonide (PULMICORT) nebulizer solution  0.5 mg Nebulization BID  . chlorhexidine gluconate (MEDLINE KIT)  15 mL Mouth Rinse BID  . clonazePAM  0.25 mg Per Tube Q6H  . famotidine  20 mg Per Tube BID  . fludrocortisone  0.1 mg Per Tube Daily  . folic acid  1 mg Per Tube Daily  . free water  300 mL Per Tube Q4H  . heparin  5,000 Units Subcutaneous Q8H  . hydrocortisone  10 mg Per Tube QHS  . hydrocortisone  5 mg Per Tube Daily  . ipratropium  0.5 mg Nebulization QID  . levalbuterol  0.63 mg Nebulization QID  . levETIRAcetam  1,500 mg Per Tube BID  . mouth rinse  15 mL Mouth Rinse QID  . metoprolol  10 mg Intravenous Q6H  . QUEtiapine  50 mg Per Tube BID  . thiamine  100 mg Per Tube Daily  . Valproate Sodium  750 mg Per Tube Q8H   Continuous Infusions: . feeding supplement (VITAL AF 1.2 CAL) 1,000 mL (12/06/16 0600)  . sodium chloride 0.45 % with kcl 50 mL/hr at 12/06/16 0600     LOS: 37 days    Time spent: 40 minutes    Camillia Marcy, Geraldo Docker, MD [facility] Pager [phone]   If 7PM-7AM, please contact night-coverage [email or web address] Password [facility] 12/06/2016, 8:36 AM

## 2016-12-06 NOTE — Progress Notes (Signed)
PULMONARY / CRITICAL CARE MEDICINE   Name: Dale [name] MRN: [id number] DOB: 10/31/1966    ADMISSION DATE:  10/23/2016  CHIEF COMPLAINT:  Nausea and Vomiting  BRIEF SUMMARY: [age] yom w/ severe neurological deficits after meningitis.  He was brought to the ED with decreased level of consciousness and nausea/vomiting/diarrhea. A CXR showed consolidation of the left upper lobe. [facility] Tracheostomy on 12/1 for prolonged ventilation   SUBJECTIVE:  No acute events.  Remains minimally responsive. Per CSW, family had talked about waiting until after holidays to make a decision on goals of care, but now have said they can't talk about it.  ? About either ex-wife or estranged wife involved CSW mentioned they will discuss role of ethics consult with primary team - agree.   VITAL SIGNS: BP (!) 140/96   Pulse 83   Temp (!) 96.3 F (35.7 C) (Rectal)   Resp (!) 33   Ht 5\' 8"  (1.727 m)   Wt 152 lb (68.9 kg)   SpO2 98%   BMI 23.11 kg/m    VENTILATOR SETTINGS: FiO2 (%):  [35 %] 35 %  INTAKE / OUTPUT: I/O last 3 completed shifts: In: 5284.2 [I.V.:969.2; Other:90; NG/GT:4225] Out: 3770 [Urine:3725; Chest Tube:45]  PHYSICAL EXAMINATION: General: Middle aged male, chronically ill appearing, not interactive  Neuro:    Does not follow commands HEENT: Trach -> t collar Cardiovascular: RRR, no M/R/G.  Lungs: reduced in bases bilaterally Abdomen: BS x 4, soft, NT/ND.  Musculoskeletal: No gross deformities, no edema.  Skin: Intact, warm, no rashes.  LABS:  BMET  Recent Labs Lab 12/03/16 0443 12/04/16 0800 12/06/16 0405  NA 147*  --  140  K 3.0* 2.9* 3.4*  CL 103  --  100*  CO2 33*  --  30  BUN 11  --  12  CREATININE 0.63  --  0.64  GLUCOSE 92  --  103*   Electrolytes  Recent Labs Lab 12/03/16 0443 12/04/16 0800 12/06/16 0405  CALCIUM 8.8*  --  8.6*  MG 1.7 1.8  --     CBC  Recent Labs Lab 12/06/16 0405  WBC 13.6*  HGB 8.4*  HCT  26.9*  PLT 371    Coag's No results for input(s): APTT, INR in the last 168 hours.  Sepsis Markers No results for input(s): LATICACIDVEN, PROCALCITON, O2SATVEN in the last 168 hours.  ABG No results for input(s): PHART, PCO2ART, PO2ART in the last 168 hours.  Liver Enzymes  Recent Labs Lab 12/06/16 0405  AST 20  ALT 13*  ALKPHOS 54  BILITOT 0.3  ALBUMIN 1.8*    Cardiac Enzymes No results for input(s): TROPONINI, PROBNP in the last 168 hours.  Glucose  Recent Labs Lab 12/05/16 1600 12/05/16 2016 12/05/16 2337 12/06/16 0356 12/06/16 0757 12/06/16 1134  GLUCAP 95 93 109* 109* 90 92    Imaging No results found.   STUDIES:  CXR 11/26 >> LUL PNA EEG 12/5 >> negative for seizure   CULTURES: Blood 11/26 >> coag neg staph 1/2  C. Diff 11/26 >> (-) Resp 11/27 >>  Enterobacter cloacae BAL 12/14 >> proteus (RES cipro otherwise sens)   ANTIBIOTICS: Vanc 11/26 >>11/30 Pip-tazo 11/26 >>12/1 Cipro 12/1 >> 12/8 Ceftriaxone 12/15 > 12/22   SIGNIFICANT EVENTS: 11/26 - Intubated (endotracheally) at [facility] ED   11/29 - bronch with left inspissated pus 12/14 - proteus from BAL 12/18 - Worsening resp status, increased WOB.  S/p FOB 12/14, CXR 12/17 still  c complete opacification L hemithorax.    LINES/TUBES: PEG PICC 11/27  RUE >> Foley [place] 12/1 >>  DISCUSSION: 51 y/o man s/p devastating meningitis, chronic debility, p/w fever, AMS & severe L HCAP vs aspiration. This has been treated.  He is trach/PEG dependent. Mucous plugging has been most recent issue.This will likely be a chronic and recurrent issue. We did obtain a BAL on 12/14 during his FOB which grew proteus and cxr on 12/20 showed improvement in LLL aeration     ASSESSMENT / PLAN:   Acute resp failure/ ARDS/ L HCAP with Enterobacter cloacae - s/p abx.  Overall slightly improved as tolerating ATC but overall prognosis for functional recovery seems poor. Left Pleural Effusion, massive s/p chest tube  > improved  Left lung atelectasis/collapse d/t mucous plugging > improved  Hx of trach x2. Trached for the 3rd time on 12/1   Continue ATC. Chest tube to waterseal ,  01/01 continues to drain, now down to 35cc in 24 hr period CXR intermittently Agree with ethics consult as his prognosis for functional recovery is poor   Rest of care per primary team.   Rutherford Guys, PA - [facility] Pager: [phone]  or [phone] 12/06/2016, 1:20 PM   Attending Note:  I have examined patient, reviewed labs, studies and notes. I have discussed the case with Ihor Dow, and I agree with the data and plans as amended above.  Levy Pupa, MD, PhD 12/06/2016, 8:22 PM [facility] [phone] or if no answer [phone]

## 2016-12-07 ENCOUNTER — Inpatient Hospital Stay (HOSPITAL_COMMUNITY): Payer: Medicaid Other

## 2016-12-07 LAB — GLUCOSE, CAPILLARY
GLUCOSE-CAPILLARY: 108 mg/dL — AB (ref 65–99)
GLUCOSE-CAPILLARY: 92 mg/dL (ref 65–99)
Glucose-Capillary: 100 mg/dL — ABNORMAL HIGH (ref 65–99)
Glucose-Capillary: 103 mg/dL — ABNORMAL HIGH (ref 65–99)
Glucose-Capillary: 106 mg/dL — ABNORMAL HIGH (ref 65–99)
Glucose-Capillary: 108 mg/dL — ABNORMAL HIGH (ref 65–99)
Glucose-Capillary: 82 mg/dL (ref 65–99)

## 2016-12-07 MED ORDER — POTASSIUM CHLORIDE 20 MEQ/15ML (10%) PO SOLN
40.0000 meq | Freq: Once | ORAL | Status: AC
  Administered 2016-12-07: 40 meq via ORAL
  Filled 2016-12-07: qty 30

## 2016-12-07 MED ORDER — METOPROLOL TARTRATE 25 MG PO TABS
25.0000 mg | ORAL_TABLET | Freq: Two times a day (BID) | ORAL | Status: DC
  Administered 2016-12-07 – 2016-12-16 (×17): 25 mg
  Filled 2016-12-07 (×18): qty 1

## 2016-12-07 MED ORDER — LEVALBUTEROL HCL 0.63 MG/3ML IN NEBU
0.6300 mg | INHALATION_SOLUTION | Freq: Three times a day (TID) | RESPIRATORY_TRACT | Status: DC
  Administered 2016-12-07 – 2016-12-11 (×12): 0.63 mg via RESPIRATORY_TRACT
  Filled 2016-12-07 (×12): qty 3

## 2016-12-07 MED ORDER — LEVALBUTEROL HCL 0.63 MG/3ML IN NEBU: 0.6300 mg | INHALATION_SOLUTION | RESPIRATORY_TRACT | Status: DC | PRN

## 2016-12-07 MED ORDER — DEXTROSE-NACL 5-0.45 % IV SOLN
INTRAVENOUS | Status: DC
  Administered 2016-12-07: 08:00:00 via INTRAVENOUS

## 2016-12-07 MED ORDER — SODIUM CHLORIDE 0.9 % IV SOLN: INTRAVENOUS | Status: DC

## 2016-12-07 MED ORDER — SODIUM CHLORIDE 0.9 % IV SOLN
30.0000 meq | Freq: Once | INTRAVENOUS | Status: AC
  Administered 2016-12-07: 30 meq via INTRAVENOUS
  Filled 2016-12-07: qty 15

## 2016-12-07 MED ORDER — POTASSIUM CHLORIDE CRYS ER 20 MEQ PO TBCR: 40.0000 meq | EXTENDED_RELEASE_TABLET | Freq: Once | ORAL | Status: DC

## 2016-12-07 MED ORDER — IPRATROPIUM BROMIDE 0.02 % IN SOLN
0.5000 mg | Freq: Three times a day (TID) | RESPIRATORY_TRACT | Status: DC
  Administered 2016-12-07 – 2016-12-11 (×12): 0.5 mg via RESPIRATORY_TRACT
  Filled 2016-12-07 (×12): qty 2.5

## 2016-12-07 NOTE — Progress Notes (Addendum)
5pm Spoke with pt wife an confirmed she had set a meeting to discuss patient care with Palliative team tomorrow at 8:30am  3:30pm CSW assisting to find additional family contacts and attempt to make contact with patient wife to establish plan for patient.  CSW left messages for all three of wife's number and gaining information for patients dtr, Ebony, which has been added to facesheet.  Received call back from pt wife who states she had left message with RN station Monday for MDs to call her back regarding meeting.  States she is willing to come in tomorrow (prefers 8am) to meet with palliative team to discuss pt care and plans to have pt son or pt dtr with her.  CSW informed palliative medicine team- page sent out to see if someone could meet at that time and call wife to confirm meeting  CSW will continue to follow  Burna SisJenna H. Lanika Colgate, LCSW Clinical Social Worker [phone]

## 2016-12-07 NOTE — Progress Notes (Addendum)
Nutrition Follow-up  DOCUMENTATION CODES:   Severe malnutrition in context of chronic illness  INTERVENTION:    Continue Vital AF 1.2 via PEG at 65 ml/h to provide 1872 kcals, 117 gm protein, 1265 ml free water daily.  Continue free water flushes 300 ml every 4 hours.  NUTRITION DIAGNOSIS:   Malnutrition (Severe) related to chronic illness as evidenced by severe depletion of body fat, severe depletion of muscle mass  Ongoing  GOAL:   Patient will meet greater than or equal to 90% of their needs  Met  MONITOR:   TF tolerance, I & O's, Vent status, Labs  ASSESSMENT:   Pt with strep pneumo meningitis c/b cardiac arrest and cardiogenic shock, ARDS requiring prolonged intubation and trach (now decannulated), and acute renal failure.  He was admitted to [facility] 06/16/16 through 07/11/16 for recurrent hypoglycemia due to adrenal insufficiency, new subacute CVA, seizures with inability to protect airway s/p intubation 7/13 through 7/21.  He re-presented 8/08 again with sepsis from UTI, and was intubated due to AMS, and was trached 8/18, and was discharged with a cuffless trach. That hospitalization was c/b MRSA bacteremia 8/24, and completed 3 weeks of IV vanc. He also developed C. Diff, and was treated with PO vanc through 9/11. He has had continued autonomic dysreflexia, which worsens during complications of acute illnesses. Admitted with N/V/D.   Pt transferred from [facility] to [facility] 12/8. S/p devastating meningitis, chronic debility and severe HCAP vs aspiration.  Pt now on trach collar. Receiving Vital AF 1.2 via PEG at 65 ml/h to provide 1872 kcals, 117 gm protein, 1265 ml free water daily.  Free water flushes 300 ml every 4 hours. Ethics Committee consulted. Weight stable.  Diet Order:  Diet NPO time specified  Skin:  Wound (see comment) (unstageable right heel)  Last BM:  1/2  Height:   Ht Readings from Last 1 Encounters:  10/31/16 '5\' 8"'$  (1.727 m)     Weight:  Wt Readings from Last 1 Encounters:  12/06/16 152 lb (68.9 kg)   01/02  152 lb 01/01  152 lb 12/31  147 lb 12/30  147 lb 12/29  153 lb 12/28  151 lb 12/27  153 lb 12/26  151 lb 12/25  152 lb 12/24  147 lb 12/23  148 lb 12/22  154 lb  Ideal Body Weight:  70 kg  BMI:  Body mass index is 23.11 kg/m.  Estimated Nutritional Needs:   Kcal:  1800-2000  Protein:  110-120 gm  Fluid:  1.8-2.0 L  EDUCATION NEEDS:   No education needs identified at this time  Arthur Holms, RD, LDN Pager #: [phone] After-Hours Pager #: [phone]

## 2016-12-07 NOTE — Progress Notes (Signed)
Call to MD to see if he wants 10mg  IV lopressor given. HR 66, BP 110/86, MD wants RN to D/c IV med and he will enter in new route, PO med since patient has PEG tube

## 2016-12-07 NOTE — Progress Notes (Signed)
PROGRESS NOTE    Dale [name]  FBP:[id number] DOB: [birth year] DOA: 10/17/2016 PCP: No PCP Per Patient   Brief Narrative:  51 y.o. BM with PMHx Cerebral septic emboli ; Cerebral thrombosis with cerebral infarction (03/25/2016),  Convulsions/seizures  (05/04/2016); Dementia (04/27/2016); Depression; Dysphagia (05/04/2016); ETOH abuse;Tobacco abuse , Hydrocephalus Acute encephalopathy;Meningitis, Acute ischemic stroke, Cardiac arrest; Cardiogenic shock, NSTEMI,  Chronic Diastolic and Systolic CHF    Admission in March 2017 with strep pneumo meningitis c/b cardiac arrest and cardiogenic shock, ARDS requiring prolonged intubation and trach (now decannulated), and acute renal failure. He was admitted to [facility] 06/16/16 through 07/11/16 for recurrent hypoglycemia due to adrenal insufficiency, new subacute CVA, seizures with inability to protect airway s/p intubation 7/13 through 7/21. He re-presented 8/08 again with sepsis from UTI, and was intubated due to AMS, and was trached 8/18, and was discharged with a cuffless trach. That hospitalization was c/b MRSA bacteremia 8/24, and completed 3 weeks of IV vanc. He also developed C. Diff, and was treated with PO vanc through 9/11. He has had continued autonomic dysreflexia, which worsens during complications of acute illnesses.  Prior to this presentation, he had been well, although he was decannulated for reasons that are unclear. He was brought to the ED with decreased level of consciousness and nausea/vomiting/diarrhea. A CXR showed consolidation of the left upper lobe. He was intubated and transferred to [facility].  There have been significant amount of conversations with his wife with regard to goals of care, but she has insisted on full support. He is full code.   Subjective: Patient in bed, nonverbal, responds minimally to painful stimuli only, appears to be in no distress  Assessment & Plan:   Active Problems:   Adrenal insufficiency (Addison's disease)  ([place])   Respiratory failure (HCC)   Renal failure   Pressure injury of skin   Acute respiratory failure with hypoxia (HCC)   ARDS (adult respiratory distress syndrome) (HCC)   Severe sepsis (HCC)   Chronic anoxic encephalopathy (HCC)   Systolic and diastolic CHF, chronic (HCC)   Mucus plugging of bronchi   Abdominal distension   Collapse of left lung   Encephalopathy chronic   Adrenal insufficiency (HCC)   Severe malnutrition (HCC)   Chest tube in place   Acute hypoxic respiratory failure secondary to Enterobacter HCAP w/ ARDS and Sepsis  Status post tracheostomy for the third time - trach care per PCCM with no plans to decannulate - sepsis and acute infection appear to have cleared - tolerating trach collar - SPO2 goal 93% -Left chest tube in place draining  mostly serous fluid. -1/2 left chest tube to waterseal: PCXR on 12/07/2016 if no pneumothorax DC CHEST TUBE.  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, Mental status at baseline, he appears to have a very poor baseline and quality of life.  Transient Atrial fibrillation -currently NSR   Chronic Systolic and Diastolic CHF -EF 82% Aug 4235  - no evidence of volume overload at this time  -Strict in and out since admission + 6.9 L  Filed Weights   12/04/16 0358 12/05/16 0500 12/06/16 0358  Weight: 66.7 kg (147 lb) 68.9 kg (152 lb) 68.9 kg (152 lb)  -  weight is Relatively stable  Adrenal insufficiency  - 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   Hypernatremia -Free water 300 ml QID    Hypokalemia -Potassium goal > 4  -Potassium 60 mEq  Hypomagnesemia -Magnesium goal > 2   Severe malnutrition in context of  chronic illness -Continue tube feeds per PEG   Goals of care - PALLIATIVE CARE: Review chart shows that the care family on 12/15 and family appears not wanted to engage with palliative care until after the holidays.  -Review of chart shows that palliative care/LCSW have attempted on numerous occasions to contact wife and now wife states she is sick and cannot make a decision. May need to start proceedings on having a guardian appointed by the court      DVT prophylaxis: Subcutaneous heparin Code Status: Full Family Communication: None Disposition Plan: Ready for SNF When chest tube discontinued   Consultants:  [facility] M Ethics  Procedures/Significant Events:  11/26 Intubated (endotracheally) at [facility] ED - transferred to [facility]  11/29 bronch - left inspissated pus   VENTILATOR SETTINGS: NA   Cultures   Antimicrobials: Anti-infectives    Start     Stop   11/25/16 1500  cefTRIAXone (ROCEPHIN) 1 g in dextrose 5 % 50 mL IVPB     11/25/16 1724   11/18/16 1500  cefTRIAXone (ROCEPHIN) 1 g in dextrose 5 % 50 mL IVPB  Status:  Discontinued     11/25/16 1015   11/11/16 2000  ciprofloxacin (CIPRO) tablet 750 mg     11/11/16 2010   11/09/2016 1600  ciprofloxacin (CIPRO) tablet 750 mg  Status:  Discontinued     11/11/16 0957   11/02/16 1400  cefTRIAXone (ROCEPHIN) 1 g in dextrose 5 % 50 mL IVPB  Status:  Discontinued     11/20/2016 1328   11/01/16 1000  vancomycin (VANCOCIN) IVPB 1000 mg/200 mL premix  Status:  Discontinued     11/03/16 1026   10/31/16 1600  vancomycin (VANCOCIN) IVPB 750 mg/150 ml premix  Status:  Discontinued     11/01/16 0818   10/31/16 0000  piperacillin-tazobactam (ZOSYN) IVPB 3.375 g  Status:  Discontinued     11/02/16 1254   10/26/2016 1600  piperacillin-tazobactam (ZOSYN) IVPB 3.375 g     10/27/2016 1637   10/22/2016 1600  vancomycin (VANCOCIN) IVPB 1000 mg/200 mL premix     10/24/2016 1731      Devices    LINES / TUBES:  -Chest tube last 24 hours:  54m    Continuous Infusions: . dextrose 5 % and 0.45% NaCl 75 mL/hr at 12/07/16 0732  . feeding supplement (VITAL AF 1.2 CAL) 1,000 mL (12/07/16 0715)     Objective: Vitals:   12/07/16 0800 12/07/16 0844 12/07/16 1151 12/07/16 1200  BP:  (!) 151/101  110/86  Pulse:   66 66  Resp:   (!) 25 20  Temp: 97.7 F (36.5 C)     TempSrc: Oral  Oral Axillary  SpO2:   97% 96%  Weight:      Height:        Intake/Output Summary (Last 24 hours) at 12/07/16 1247 Last data filed at 12/07/16 0715  Gross per 24 hour  Intake          3228.75 ml  Output             3660 ml  Net          -431.25 ml   Filed Weights   12/04/16 0358 12/05/16  0500 12/06/16 0358  Weight: 66.7 kg (147 lb) 68.9 kg (152 lb) 68.9 kg (152 lb)    Examination:  General: No acute respiratory distress  Lungs: Rhonchorous bilaterally Cardiovascular: Renal rhythm and rate, negative murmurs rubs or gallops, w/o gallup or rub   Abdomen: Nondistended, soft, bowel sounds positive, PEG tube in place negative sign of infection Extremities: Stable trace edema B LE  .     Data Reviewed: Care during the described time interval was provided by me .  I have reviewed this patient's available data, including medical history, events of note, physical examination, and all test results as part of my evaluation. I have personally reviewed and interpreted all radiology studies.  CBC:  Recent Labs Lab 12/06/16 0405  WBC 13.6*  HGB 8.4*  HCT 26.9*  MCV 91.5  PLT 734   Basic Metabolic Panel:  Recent Labs Lab 12/03/16 0443 12/04/16 0800 12/06/16 0405  NA 147*  --  140  K 3.0* 2.9* 3.4*  CL 103  --  100*  CO2 33*  --  30  GLUCOSE 92  --  103*  BUN 11  --  12  CREATININE 0.63  --  0.64  CALCIUM 8.8*  --  8.6*  MG 1.7 1.8  --    GFR: Estimated Creatinine Clearance: 104.5 mL/min (by C-G formula based on SCr of 0.64 mg/dL). Liver Function Tests:  Recent Labs Lab 12/06/16 0405  AST 20  ALT 13*  ALKPHOS 54   BILITOT 0.3  PROT 6.9  ALBUMIN 1.8*   No results for input(s): LIPASE, AMYLASE in the last 168 hours. No results for input(s): AMMONIA in the last 168 hours. Coagulation Profile: No results for input(s): INR, PROTIME in the last 168 hours. Cardiac Enzymes: No results for input(s): CKTOTAL, CKMB, CKMBINDEX, TROPONINI in the last 168 hours. BNP (last 3 results) No results for input(s): PROBNP in the last 8760 hours. HbA1C: No results for input(s): HGBA1C in the last 72 hours. CBG:  Recent Labs Lab 12/06/16 2056 12/07/16 0034 12/07/16 0433 12/07/16 0840 12/07/16 1155  GLUCAP 88 106* 92 100* 108*   Lipid Profile: No results for input(s): CHOL, HDL, LDLCALC, TRIG, CHOLHDL, LDLDIRECT in the last 72 hours. Thyroid Function Tests: No results for input(s): TSH, T4TOTAL, FREET4, T3FREE, THYROIDAB in the last 72 hours. Anemia Panel: No results for input(s): VITAMINB12, FOLATE, FERRITIN, TIBC, IRON, RETICCTPCT in the last 72 hours. Urine analysis:    Component Value Date/Time   COLORURINE YELLOW 10/08/2016 1807   APPEARANCEUR CLEAR 10/06/2016 1807   LABSPEC <1.005 (L) 10/31/2016 1807   PHURINE 7.0 10/15/2016 1807   GLUCOSEU NEGATIVE 10/19/2016 1807   HGBUR LARGE (A) 11/02/2016 1807   BILIRUBINUR NEGATIVE 10/14/2016 1807   KETONESUR NEGATIVE 11/02/2016 1807   PROTEINUR 30 (A) 10/14/2016 1807   UROBILINOGEN 1.0 11/13/2011 2010   NITRITE NEGATIVE 10/11/2016 1807   LEUKOCYTESUR NEGATIVE 10/16/2016 1807   Sepsis Labs: _0 (procalcitonin:4,lacticidven:4)  No results found for this or any previous visit (from the past 240 hour(s)).    Radiology Studies: Dg Chest Port 1 View  Result Date: 12/07/2016 CLINICAL DATA:  Pneumothorax. EXAM: PORTABLE CHEST 1 VIEW COMPARISON:  Radiograph May 05, 2017. FINDINGS: Stable cardiomediastinal silhouette. Endotracheal tube is unchanged in position. Right-sided PICC line is again noted and unchanged. Left-sided chest tube is unchanged. No  pneumothorax is noted. Stable probable small loculated pleural effusion along left lateral chest wall. Probable layering effusion seen in right lung. Stable left upper lobe opacity concerning for  atelectasis or infiltrate. Bony thorax is unremarkable. IMPRESSION: Stable support apparatus. No pneumothorax is noted. Stable probable loculated left pleural effusion along left chest wall. Stable probable right pleural effusion. Stable left upper lobe opacity concerning for atelectasis or infiltrate. Electronically Signed   By: Marijo Conception, M.D.   On: 12/07/2016 07:42   Scheduled Meds: . amiodarone  200 mg Oral BID  . atorvastatin  20 mg Per Tube q1800  . budesonide (PULMICORT) nebulizer solution  0.5 mg Nebulization BID  . chlorhexidine gluconate (MEDLINE KIT)  15 mL Mouth Rinse BID  . clonazePAM  0.25 mg Per Tube Q6H  . famotidine  20 mg Per Tube BID  . fludrocortisone  0.1 mg Per Tube Daily  . folic acid  1 mg Per Tube Daily  . free water  300 mL Per Tube Q4H  . heparin  5,000 Units Subcutaneous Q8H  . hydrocortisone  10 mg Per Tube QHS  . hydrocortisone  5 mg Per Tube Daily  . ipratropium  0.5 mg Nebulization QID  . levalbuterol  0.63 mg Nebulization QID  . levETIRAcetam  1,500 mg Per Tube BID  . mouth rinse  15 mL Mouth Rinse QID  . metoprolol  10 mg Intravenous Q6H  . QUEtiapine  50 mg Per Tube BID  . thiamine  100 mg Per Tube Daily  . Valproate Sodium  750 mg Per Tube Q8H   Continuous Infusions: . dextrose 5 % and 0.45% NaCl 75 mL/hr at 12/07/16 0732  . feeding supplement (VITAL AF 1.2 CAL) 1,000 mL (12/07/16 0715)     LOS: 38 days   Time spent: 40 minutes  Thurnell Lose, MD [facility] Pager [phone]  If 7PM-7AM, please contact night-coverage [email or web address] Password [facility] 12/07/2016, 12:47 PM

## 2016-12-07 NOTE — Progress Notes (Signed)
Patient been on bear hugger for an hour and a half, temp increase to 94.6, patient on bear hugger, all other VS WNL. Will keep patient on bear hugger and continue to monitor.

## 2016-12-08 DIAGNOSIS — J189 Pneumonia, unspecified organism: Secondary | ICD-10-CM

## 2016-12-08 DIAGNOSIS — E43 Unspecified severe protein-calorie malnutrition: Secondary | ICD-10-CM

## 2016-12-08 DIAGNOSIS — A419 Sepsis, unspecified organism: Secondary | ICD-10-CM

## 2016-12-08 LAB — CBC
HCT: 25.9 % — ABNORMAL LOW (ref 39.0–52.0)
HEMOGLOBIN: 8 g/dL — AB (ref 13.0–17.0)
MCH: 28.3 pg (ref 26.0–34.0)
MCHC: 30.9 g/dL (ref 30.0–36.0)
MCV: 91.5 fL (ref 78.0–100.0)
Platelets: 367 10*3/uL (ref 150–400)
RBC: 2.83 MIL/uL — AB (ref 4.22–5.81)
RDW: 19.3 % — ABNORMAL HIGH (ref 11.5–15.5)
WBC: 15 10*3/uL — ABNORMAL HIGH (ref 4.0–10.5)

## 2016-12-08 LAB — MAGNESIUM: Magnesium: 1.6 mg/dL — ABNORMAL LOW (ref 1.7–2.4)

## 2016-12-08 LAB — BASIC METABOLIC PANEL
ANION GAP: 9 (ref 5–15)
BUN: 13 mg/dL (ref 6–20)
CHLORIDE: 106 mmol/L (ref 101–111)
CO2: 25 mmol/L (ref 22–32)
Calcium: 8.5 mg/dL — ABNORMAL LOW (ref 8.9–10.3)
Creatinine, Ser: 0.74 mg/dL (ref 0.61–1.24)
GFR calc Af Amer: >60 mL/min (ref >60)
GFR calc non Af Amer: >60 mL/min (ref >60)
GLUCOSE: 80 mg/dL (ref 65–99)
POTASSIUM: 4.3 mmol/L (ref 3.5–5.1)
Sodium: 140 mmol/L (ref 135–145)

## 2016-12-08 LAB — GLUCOSE, CAPILLARY
GLUCOSE-CAPILLARY: 83 mg/dL (ref 65–99)
Glucose-Capillary: 89 mg/dL (ref 65–99)
Glucose-Capillary: 95 mg/dL (ref 65–99)
Glucose-Capillary: 97 mg/dL (ref 65–99)
Glucose-Capillary: 85 mg/dL (ref 65–99)

## 2016-12-08 MED ORDER — MAGNESIUM SULFATE 50 % IJ SOLN
3.0000 g | Freq: Once | INTRAVENOUS | Status: AC
  Administered 2016-12-08: 3 g via INTRAVENOUS
  Filled 2016-12-08: qty 6

## 2016-12-08 NOTE — Progress Notes (Signed)
PROGRESS NOTE    Dale [name]  GOT:[id number] DOB: 06-06-1966 DOA: 10/07/2016 PCP: No PCP Per Patient   Brief Narrative:  51 y.o. BM with PMHx Cerebral septic emboli ; Cerebral thrombosis with cerebral infarction (03/25/2016),  Convulsions/seizures  (05/04/2016); Dementia (04/27/2016); Depression; Dysphagia (05/04/2016); ETOH abuse;Tobacco abuse , Hydrocephalus Acute encephalopathy;Meningitis, Acute ischemic stroke, Cardiac arrest; Cardiogenic shock, NSTEMI,  Chronic Diastolic and Systolic CHF    Admission in March 2017 with strep pneumo meningitis c/b cardiac arrest and cardiogenic shock, ARDS requiring prolonged intubation and trach (now decannulated), and acute renal failure. He was admitted to [facility] 06/16/16 through 07/11/16 for recurrent hypoglycemia due to adrenal insufficiency, new subacute CVA, seizures with inability to protect airway s/p intubation 7/13 through 7/21. He re-presented 8/08 again with sepsis from UTI, and was intubated due to AMS, and was trached 8/18, and was discharged with a cuffless trach. That hospitalization was c/b MRSA bacteremia 8/24, and completed 3 weeks of IV vanc. He also developed C. Diff, and was treated with PO vanc through 9/11. He has had continued autonomic dysreflexia, which worsens during complications of acute illnesses.  Prior to this presentation, he had been well, although he was decannulated for reasons that are unclear. He was brought to the ED with decreased level of consciousness and nausea/vomiting/diarrhea. A CXR showed consolidation of the left upper lobe. He was intubated and transferred to [facility].  There have been significant amount of conversations with his wife with regard to goals of care, but she has insisted on full support. He is full code.   Subjective: 1/4  A/O x 0.  Mild w/d to pain eyes open    Assessment & Plan:   Active Problems:   Adrenal insufficiency (Addison's disease) ([place])   Respiratory failure (HCC)   Renal failure  Pressure injury of skin   Acute respiratory failure with hypoxia (HCC)   ARDS (adult respiratory distress syndrome) (HCC)   Severe sepsis (HCC)   Chronic anoxic encephalopathy (HCC)   Systolic and diastolic CHF, chronic (HCC)   Mucus plugging of bronchi   Abdominal distension   Collapse of left lung   Encephalopathy chronic   Adrenal insufficiency (HCC)   Severe malnutrition (HCC)   Chest tube in place   Acute hypoxic respiratory failure secondary to Enterobacter HCAP w/ ARDS and Sepsis  Status post tracheostomy for the third time - trach care per PCCM with no plans to decannulate - sepsis and acute infection appear to have cleared - tolerating trach collar - Continue to titrate down 02 but maintain SPO2 goal> 93% -Left chest tube in place draining  mostly serous fluid. -1/4 Left  CHEST TUBE. 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 -Mental status at baseline   Transient Atrial fibrillation -currently NSR   Chronic Systolic and Diastolic CHF -EF 59% Aug 7416  - no evidence of volume overload at this time  -Strict in and out since admission + 5.2 L  Filed Weights   12/05/16 0500 12/06/16 0358 12/08/16 0500  Weight: 68.9 kg (152 lb) 68.9 kg (152 lb) 70.8 kg (156 lb)  - weight is Relatively stable  Adrenal insufficiency  - 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  Hypernatremia -Free water 300 ml QID    Hypokalemia -Potassium goal > 4   Hypomagnesemia -Magnesium goal > 2  -Magnesium IV 3 g  Severe malnutrition in context of chronic illness -Continue tube feeds per PEG   Goals of care - PALLIATIVE CARE: Review chart shows that the care  family on 12/15 and family appears not wanted to engage with palliative care until after the holidays.  -Patient now DO NOT RESUSCITATE -Goal is to titrate patient down to 28% on trach collar which will allow placement at SNF     DVT prophylaxis: Subcutaneous heparin Code Status: Full Family Communication: None Disposition Plan: Ready for SNF When trach collar at 28%   Consultants:  [facility] M  Procedures/Significant Events:  11/26 Intubated (endotracheally) at [facility] ED - transferred to [facility]  11/29 bronch - left inspissated pus   VENTILATOR SETTINGS: NA   Cultures   Antimicrobials: Anti-infectives    Start     Stop   11/25/16 1500  cefTRIAXone (ROCEPHIN) 1 g in dextrose 5 % 50 mL IVPB     11/25/16 1724   11/18/16 1500  cefTRIAXone (ROCEPHIN) 1 g in dextrose 5 % 50 mL IVPB  Status:  Discontinued     11/25/16 1015   11/11/16 2000  ciprofloxacin (CIPRO) tablet 750 mg     11/11/16 2010   11/25/2016 1600  ciprofloxacin (CIPRO) tablet 750 mg  Status:  Discontinued     11/11/16 0957   11/02/16 1400  cefTRIAXone (ROCEPHIN) 1 g in dextrose 5 % 50 mL IVPB  Status:  Discontinued     12/01/2016 1328   11/01/16 1000  vancomycin (VANCOCIN) IVPB 1000 mg/200 mL premix  Status:  Discontinued     11/03/16 1026   10/31/16 1600  vancomycin (VANCOCIN) IVPB 750 mg/150 ml premix  Status:  Discontinued     11/01/16 0818   10/31/16 0000  piperacillin-tazobactam (ZOSYN) IVPB 3.375 g  Status:  Discontinued     11/02/16 1254   10/19/2016 1600  piperacillin-tazobactam (ZOSYN) IVPB 3.375 g     10/26/2016 1637   11/03/2016 1600  vancomycin (VANCOCIN) IVPB 1000 mg/200 mL premix     10/11/2016 1731      Devices    LINES / TUBES:  PEG PICC 11/27  RUE >> Foley Trache 12/1 >> -Chest tube last 24 hours: 59m    Continuous Infusions: . feeding supplement (VITAL AF 1.2 CAL) 1,000 mL (12/08/16 0446)     Objective: Vitals:   12/08/16 1246 12/08/16 1530 12/08/16 1600 12/08/16 1655  BP:   (!) 132/94  114/86  Pulse:   94   Resp:   (!) 28   Temp: 99.5 F (37.5 C)   98.3 F (36.8 C)  TempSrc: Axillary   Axillary  SpO2:  96% 97%   Weight:      Height:        Intake/Output Summary (Last 24 hours) at 12/08/16 1729 Last data filed at 12/08/16 1600  Gross per 24 hour  Intake          3628.75 ml  Output             3135 ml  Net           493.75 ml   Filed Weights   12/05/16 0500 12/06/16 0358 12/08/16 0500  Weight: 68.9 kg (152 lb) 68.9 kg (152 lb) 70.8 kg (156 lb)    Examination:  General: No acute respiratory distress  Lungs: Rhonchorous bilaterally, Decreased from 1/2  Cardiovascular: Renal rhythm and rate, negative murmurs rubs or gallops, w/o gallup or rub   Abdomen: Nondistended, soft, bowel sounds positive, PEG tube in place negative sign of infection Extremities: Stable trace edema B LE  .     Data Reviewed: Care during the described time interval was provided by me .  I have reviewed this patient's available data, including medical history, events of note, physical examination, and all test results as part of my evaluation. I have personally reviewed and interpreted all radiology studies.  CBC:  Recent Labs Lab 12/06/16 0405 12/08/16 0458  WBC 13.6* 15.0*  HGB 8.4* 8.0*  HCT 26.9* 25.9*  MCV 91.5 91.5  PLT 371 419   Basic Metabolic Panel:  Recent Labs Lab 12/03/16 0443 12/04/16 0800 12/06/16 0405 12/08/16 0458  NA 147*  --  140 140  K 3.0* 2.9* 3.4* 4.3  CL 103  --  100* 106  CO2 33*  --  30 25  GLUCOSE 92  --  103* 80  BUN 11  --  12 13  CREATININE 0.63  --  0.64 0.74  CALCIUM 8.8*  --  8.6* 8.5*  MG 1.7 1.8  --  1.6*   GFR: Estimated Creatinine Clearance: 104.5 mL/min (by C-G formula based on SCr of 0.74 mg/dL). Liver Function Tests:  Recent Labs Lab 12/06/16 0405  AST 20  ALT 13*  ALKPHOS 54  BILITOT 0.3  PROT 6.9  ALBUMIN 1.8*   No results for input(s): LIPASE, AMYLASE in the last 168 hours. No results for input(s): AMMONIA in  the last 168 hours. Coagulation Profile: No results for input(s): INR, PROTIME in the last 168 hours. Cardiac Enzymes: No results for input(s): CKTOTAL, CKMB, CKMBINDEX, TROPONINI in the last 168 hours. BNP (last 3 results) No results for input(s): PROBNP in the last 8760 hours. HbA1C: No results for input(s): HGBA1C in the last 72 hours. CBG:  Recent Labs Lab 12/07/16 2340 12/08/16 0525 12/08/16 0809 12/08/16 1244 12/08/16 1654  GLUCAP 108* 89 83 95 97   Lipid Profile: No results for input(s): CHOL, HDL, LDLCALC, TRIG, CHOLHDL, LDLDIRECT in the last 72 hours. Thyroid Function Tests: No results for input(s): TSH, T4TOTAL, FREET4, T3FREE, THYROIDAB in the last 72 hours. Anemia Panel: No results for input(s): VITAMINB12, FOLATE, FERRITIN, TIBC, IRON, RETICCTPCT in the last 72 hours. Urine analysis:    Component Value Date/Time   COLORURINE YELLOW 10/28/2016 1807   APPEARANCEUR CLEAR 10/09/2016 1807   LABSPEC <1.005 (L) 10/26/2016 1807   PHURINE 7.0 10/14/2016 1807   GLUCOSEU NEGATIVE 10/20/2016 1807   HGBUR LARGE (A) 10/15/2016 1807   BILIRUBINUR NEGATIVE 10/23/2016 1807   KETONESUR NEGATIVE 10/29/2016 1807   PROTEINUR 30 (A) 10/28/2016 1807   UROBILINOGEN 1.0 11/13/2011 2010   NITRITE NEGATIVE 11/01/2016 1807   LEUKOCYTESUR NEGATIVE 10/14/2016 1807   Sepsis Labs: '@LABRCNTIP'$ (procalcitonin:4,lacticidven:4)  ) No results found for this or any previous visit (from the past 240 hour(s)).       Radiology Studies: Dg Chest Port 1 View  Result Date: 12/07/2016 CLINICAL DATA:  Pneumothorax. EXAM: PORTABLE CHEST 1 VIEW COMPARISON:  Radiograph May 05, 2017. FINDINGS: Stable cardiomediastinal silhouette. Endotracheal tube is unchanged in position. Right-sided PICC line is again noted and unchanged. Left-sided chest tube is unchanged. No pneumothorax is noted. Stable probable small loculated pleural effusion along left lateral chest wall. Probable layering effusion seen in  right lung. Stable left upper lobe opacity concerning for atelectasis or infiltrate. Bony thorax is unremarkable. IMPRESSION: Stable  support apparatus. No pneumothorax is noted. Stable probable loculated left pleural effusion along left chest wall. Stable probable right pleural effusion. Stable left upper lobe opacity concerning for atelectasis or infiltrate. Electronically Signed   By: Marijo Conception, M.D.   On: 12/07/2016 07:42        Scheduled Meds: . amiodarone  200 mg Oral BID  . atorvastatin  20 mg Per Tube q1800  . budesonide (PULMICORT) nebulizer solution  0.5 mg Nebulization BID  . chlorhexidine gluconate (MEDLINE KIT)  15 mL Mouth Rinse BID  . clonazePAM  0.25 mg Per Tube Q6H  . famotidine  20 mg Per Tube BID  . fludrocortisone  0.1 mg Per Tube Daily  . folic acid  1 mg Per Tube Daily  . free water  300 mL Per Tube Q4H  . heparin  5,000 Units Subcutaneous Q8H  . hydrocortisone  10 mg Per Tube QHS  . hydrocortisone  5 mg Per Tube Daily  . ipratropium  0.5 mg Nebulization TID  . levalbuterol  0.63 mg Nebulization TID  . levETIRAcetam  1,500 mg Per Tube BID  . mouth rinse  15 mL Mouth Rinse QID  . metoprolol tartrate  25 mg Per Tube BID  . QUEtiapine  50 mg Per Tube BID  . thiamine  100 mg Per Tube Daily  . Valproate Sodium  750 mg Per Tube Q8H   Continuous Infusions: . feeding supplement (VITAL AF 1.2 CAL) 1,000 mL (12/08/16 0446)     LOS: 39 days    Time spent: 40 minutes    Graciana Sessa, Geraldo Docker, MD [facility] Pager [phone]   If 7PM-7AM, please contact night-coverage [email or web address] Password [email or web address] 12/08/2016, 5:29 PM

## 2016-12-08 NOTE — Progress Notes (Signed)
I met with Mr. Tilghman wife Jermany Rimel and his SIL this morning to discuss goals of care. Enid Derry has been overwhelmed with her own health problems- she is a poorly controlled diabetic with liver cancer actively undergoing chemotherapy and has severe peripheral neuropathy. She is genuinely apologetic about not being able to come up here in person and has agreed for [place] to have future conversations and updates over the phone. She is heavily faith based an had low health literacy. She "put it all in God's hands". She is not willing to make specific decisions regarding stopping or starting certain treatments- she knows Orrin is suffering- she is afraid of what her family will think and it will cause them to get angry with her if she gives explicit permission for [place] to trannsition to comfort care. She has agreed that she will allow physicians to make the best decisions about what to start and stop. She wants to get all of their children together so I can talk to them. They have largely avoided Cecilie Lowers and no one comes to visit. They are afraid he is contagious.   I discussed with her medical futility, scenario of comfort care and allow his disease to take its natural trajectory. She would accept the fact that he has died because there was nothing else that could be offered. She cannot however unilaterally make any decisions for fear of her family. I asked permission for a physician ordered DNR and she agreed. She plan to tell family that if Jenesis's heart stops that we will not initiate a Code Blue because at this point that would be medically inappropriate care.   She is going to call me and let me know when his children can gather for additional discussion in the meantime I recommend the following:  1. DNR- do not place back on the vent 2. Do not escalate aggressive interventions. If abx are not effective or there is resistant infection they should be stopped. 3. No additional medical interventions for  irreversible illness. 4. Hopefully we can get the rest of the family in for more extensive goals and decision making.  Lane Hacker, DO Palliative Medicine [phone]

## 2016-12-08 NOTE — Progress Notes (Signed)
PULMONARY / CRITICAL CARE MEDICINE   Name: Dale [name] MRN: [id number] DOB: [birth year]    ADMISSION DATE:  2016-11-05  CHIEF COMPLAINT:  Nausea and Vomiting  BRIEF SUMMARY: 51 yom w/ severe neurological deficits after meningitis.  He was brought to the ED with decreased level of consciousness and nausea/vomiting/diarrhea. A CXR showed consolidation of the left upper lobe. He was intubated and transferred to [facility]. Tracheostomy on 12/1 for prolonged ventilation   SUBJECTIVE:  No acute events, remains unchanged.  Palliative care spoke with pt's wife 01/04 > agreed to physician ordered DNR.  Wife attempted to get hold of rest of family / children for additional discussions regarding goals of care and possibly transitioning to comfort care (wife hesitant to do so as she fears family response).   VITAL SIGNS: BP 112/78   Pulse 98   Temp 98.3 F (36.8 C) (Axillary)   Resp (!) 29   Ht 5\' 8"  (1.727 m)   Wt 156 lb (70.8 kg)   SpO2 97%   BMI 23.72 kg/m    VENTILATOR SETTINGS: FiO2 (%):  [40 %-50 %] 50 %  INTAKE / OUTPUT: I/O last 3 completed shifts: In: 4979.3 [I.V.:643.3; NG/GT:4335.9] Out: 4098 [JXBJY:7829; Drains:40; Chest Tube:50]  PHYSICAL EXAMINATION: General: Middle aged male, chronically ill appearing, not interactive  Neuro:    Does not follow commands, tracks with eyes only HEENT: Trach -> t collar Cardiovascular: RRR, no M/R/G.  Chest tube still in place, no output. Lungs: reduced in bases bilaterally Abdomen: BS x 4, soft, NT/ND.  Musculoskeletal: No gross deformities, no edema.  Skin: Intact, warm, no rashes.  LABS:  BMET  Recent Labs Lab 12/03/16 0443 12/04/16 0800 12/06/16 0405 12/08/16 0458  NA 147*  --  140 140  K 3.0* 2.9* 3.4* 4.3  CL 103  --  100* 106  CO2 33*  --  30 25  BUN 11  --  12 13  CREATININE 0.63  --  0.64 0.74  GLUCOSE 92  --  103* 80   Electrolytes  Recent Labs Lab 12/03/16 0443 12/04/16 0800 12/06/16 0405 12/08/16 0458   CALCIUM 8.8*  --  8.6* 8.5*  MG 1.7 1.8  --  1.6*    CBC  Recent Labs Lab 12/06/16 0405 12/08/16 0458  WBC 13.6* 15.0*  HGB 8.4* 8.0*  HCT 26.9* 25.9*  PLT 371 367    Coag's No results for input(s): APTT, INR in the last 168 hours.  Sepsis Markers No results for input(s): LATICACIDVEN, PROCALCITON, O2SATVEN in the last 168 hours.  ABG No results for input(s): PHART, PCO2ART, PO2ART in the last 168 hours.  Liver Enzymes  Recent Labs Lab 12/06/16 0405  AST 20  ALT 13*  ALKPHOS 54  BILITOT 0.3  ALBUMIN 1.8*    Cardiac Enzymes No results for input(s): TROPONINI, PROBNP in the last 168 hours.  Glucose  Recent Labs Lab 12/07/16 1155 12/07/16 1631 12/07/16 2153 12/07/16 2340 12/08/16 0525 12/08/16 0809  GLUCAP 108* 103* 82 108* 89 83    Imaging No results found.   STUDIES:  CXR 11/26 >> LUL PNA EEG 12/5 >> negative for seizure   CULTURES: Blood 11/26 >> coag neg staph 1/2  C. Diff 11/26 >> (-) Resp 11/27 >>  Enterobacter cloacae BAL 12/14 >> proteus (RES cipro otherwise sens)   ANTIBIOTICS: Vanc 11/26 >>11/30 Pip-tazo 11/26 >>12/1 Cipro 12/1 >> 12/8 Ceftriaxone 12/15 > 12/22   SIGNIFICANT EVENTS: 11/26 - Intubated (endotracheally) at [facility] ED  11/29 - bronch with left inspissated pus 12/14 - proteus from BAL 12/18 - Worsening resp status, increased WOB.  S/p FOB 12/14, CXR 12/17 still c complete opacification L hemithorax.    LINES/TUBES: PEG PICC 11/27  RUE >> Foley [name] 12/1 >>  DISCUSSION: 51 y/o man s/p devastating meningitis, chronic debility, p/w fever, AMS & severe L HCAP vs aspiration. This has been treated.  He is trach/PEG dependent. Mucous plugging has been most recent issue.This will likely be a chronic and recurrent issue. We did obtain a BAL on 12/14 during his FOB which grew proteus and cxr on 12/20 showed improvement in LLL aeration     ASSESSMENT / PLAN:   Acute resp failure/ ARDS/ L HCAP with Enterobacter  cloacae - s/p abx.  Overall slightly improved as tolerating ATC but overall prognosis for functional recovery seems poor. Left Pleural Effusion, massive s/p chest tube > improved.  Only 10cc output 01/03, none 01/04. Left lung atelectasis/collapse d/t mucous plugging > improved  Hx of trach x2. Trached for the 3rd time on 12/1   Continue ATC, wean FiO2 as able (on 50%). OK to d/c chest tube given no output and has remained on water seal for several days now. CXR in AM. Continue goals of care discussions with family, hopefully they will support comfort measures. If no response from family, consider ethics consult as agree in futility of care.   Rest of care per primary team.   Rutherford Guysahul Desai, PA - [facility] Pager: [phone]  or [phone] 12/08/2016, 12:18 PM   STAFF NOTE: I, Rory Percyaniel Jaymien Landin, MD FACP have personally reviewed patient's available data, including medical history, events of note, physical examination and test results as part of my evaluation. I have discussed with resident/NP and other care providers such as pharmacist, RN and RRT. In addition, I personally evaluated patient and elicited key findings of: no changes in neurostatus, limited output from chest tube, neg balance 2.9 liters over all, pcxr with residual ALI, no leak, on water seal, off vent, reintroduing vent would be futile, any heroics would be futile and cause suffering, chest tube only is risk infection - dc chest tube, NO further chest tubes will be placed as futile and not medically effective, if family not agreeble to comfort we as a team of providers should invoke the futility policy  Mcarthur RossettiDaniel J. Tyson [name], MD, FACP Pgr: [phone] [facility] 12/08/2016 12:44 PM

## 2016-12-09 ENCOUNTER — Inpatient Hospital Stay (HOSPITAL_COMMUNITY): Payer: Medicaid Other

## 2016-12-09 LAB — GLUCOSE, CAPILLARY
GLUCOSE-CAPILLARY: 102 mg/dL — AB (ref 65–99)
GLUCOSE-CAPILLARY: 102 mg/dL — AB (ref 65–99)
GLUCOSE-CAPILLARY: 113 mg/dL — AB (ref 65–99)
GLUCOSE-CAPILLARY: 121 mg/dL — AB (ref 65–99)
Glucose-Capillary: 109 mg/dL — ABNORMAL HIGH (ref 65–99)
Glucose-Capillary: 127 mg/dL — ABNORMAL HIGH (ref 65–99)
Glucose-Capillary: 91 mg/dL (ref 65–99)

## 2016-12-09 MED ORDER — SODIUM CHLORIDE 0.9 % IV BOLUS (SEPSIS)
250.0000 mL | Freq: Once | INTRAVENOUS | Status: AC
  Administered 2016-12-09: 250 mL via INTRAVENOUS

## 2016-12-09 MED ORDER — SODIUM CHLORIDE 0.9 % IV BOLUS (SEPSIS)
500.0000 mL | Freq: Once | INTRAVENOUS | Status: AC
  Administered 2016-12-09: 500 mL via INTRAVENOUS

## 2016-12-09 NOTE — Progress Notes (Signed)
Patient Demographics:    Dale [name], is a 51 y.o. male, DOB - 1966-03-02, PVX:[id number]  Admit date - 10/25/2016   Admitting Physician Rush Landmark, MD  Outpatient Primary MD for the patient is No PCP Per Patient  LOS - 81   Chief Complaint  Patient presents with  . Diarrhea        Subjective:    Dale [name] today has no fevers, no emesis,   Remains unresponsive   Assessment  & Plan :    Active Problems:   Adrenal insufficiency (Addison's disease) (HCC)   Respiratory failure (HCC)   Renal failure   Pressure injury of skin   Acute respiratory failure with hypoxia (HCC)   ARDS (adult respiratory distress syndrome) (HCC)   Severe sepsis (HCC)   Chronic anoxic encephalopathy (HCC)   Systolic and diastolic CHF, chronic (HCC)   Mucus plugging of bronchi   Abdominal distension   Collapse of left lung   Encephalopathy chronic   Adrenal insufficiency (HCC)   Severe malnutrition (HCC)   Chest tube in place   Sepsis due to pneumonia ([place])   Severe protein-calorie malnutrition ([place])  Interval history:  51 y.o.BM with PMHx Cerebral septic emboli ; Cerebral thrombosis with cerebral infarction (03/25/2016), Convulsions/seizures  (05/04/2016); Dementia (04/27/2016); Depression; Dysphagia (05/04/2016); ETOH abuse;Tobacco abuse , Hydrocephalus Acute encephalopathy;Meningitis, Acute ischemic stroke,Cardiac arrest; Cardiogenic shock, NSTEMI,  Chronic Diastolic and Systolic CHF    Admission in March 2017 with strep pneumo meningitis c/b cardiac arrest and cardiogenic shock, ARDS requiring prolonged intubation and trach (now decannulated), and acute renal failure. He was admitted to [facility] 06/16/16 through 07/11/16 for recurrent hypoglycemia due to adrenal insufficiency, new subacute CVA, seizures with inability to protect airway s/p intubation 7/13 through 7/21. He re-presented 8/08 again with sepsis from  UTI, and was intubated due to AMS, and was trached 8/18, and was discharged with a cuffless trach. That hospitalization was c/b MRSA bacteremia 8/24, and completed 3 weeks of IV vanc. He also developed C. Diff, and was treated with PO vanc through 9/11. He has had continued autonomic dysreflexia, which worsens during complications of acute illnesses.  Prior to this presentation, he had been well, although he was decannulated for reasons that are unclear. He was brought to the ED with decreased level of consciousness and nausea/vomiting/diarrhea. A CXR showed consolidation of the left upper lobe. He was intubated and transferred to [facility].  1)Acute hypoxic respiratory failure secondary to Enterobacter HCAP w/ ARDS and Sepsis - Status post tracheostomy for the third time - trach care per PCCM with no plans to decannulate - sepsis and acute infection appear to have cleared - tolerating trach collar, Continue to titrate down 02 but maintain SPO2 goal> 93% , left-sided chest tube discontinued on 12/08/2016   2)Enterobacter cloacae left lung pneumonia w/ extensive infiltrate and atx of L lung- -completed antibiotic therapy, -Coag neg Staph in 1 of 2 blood cx , Most c/w contaminant - no symptoms at present to suggest active bacteremia  3)Acute on chronic Encephalopathy - - 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 Dense right hemiplegia   4)Chronic Systolic and Diastolic CHF/Transient Afib--EF 40% Aug 2017 , - no evidence of volume  overload at this time    5)HYpotension- suspect secondary to loose stools, responded well to IV fluids, loose stools probably secondary to PEG tube feedings  6)Social/Ethics- overall prognosis is poor, patient has no significant cognitive functions, remains unresponsive. Wife is primary contact  Code Status : DNR   Disposition Plan  : To be determined  Consults  :  Palliative care and ethics commitee  DVT Prophylaxis   :     SCDs  Lab Results  Component Value Date   PLT 367 12/08/2016    Inpatient Medications  Scheduled Meds: . amiodarone  200 mg Oral BID  . atorvastatin  20 mg Per Tube q1800  . budesonide (PULMICORT) nebulizer solution  0.5 mg Nebulization BID  . chlorhexidine gluconate (MEDLINE KIT)  15 mL Mouth Rinse BID  . clonazePAM  0.25 mg Per Tube Q6H  . famotidine  20 mg Per Tube BID  . fludrocortisone  0.1 mg Per Tube Daily  . folic acid  1 mg Per Tube Daily  . free water  300 mL Per Tube Q4H  . heparin  5,000 Units Subcutaneous Q8H  . hydrocortisone  10 mg Per Tube QHS  . hydrocortisone  5 mg Per Tube Daily  . ipratropium  0.5 mg Nebulization TID  . levalbuterol  0.63 mg Nebulization TID  . levETIRAcetam  1,500 mg Per Tube BID  . mouth rinse  15 mL Mouth Rinse QID  . metoprolol tartrate  25 mg Per Tube BID  . QUEtiapine  50 mg Per Tube BID  . thiamine  100 mg Per Tube Daily  . Valproate Sodium  750 mg Per Tube Q8H   Continuous Infusions: . feeding supplement (VITAL AF 1.2 CAL) 1,000 mL (12/09/16 1517)   PRN Meds:.acetaminophen, fentaNYL (SUBLIMAZE) injection, levalbuterol, LORazepam, sodium chloride flush    Anti-infectives    Start     Dose/Rate Route Frequency Ordered Stop   11/25/16 1500  cefTRIAXone (ROCEPHIN) 1 g in dextrose 5 % 50 mL IVPB     1 g 100 mL/hr over 30 Minutes Intravenous Every 24 hours 11/25/16 1015 11/25/16 1724   11/18/16 1500  cefTRIAXone (ROCEPHIN) 1 g in dextrose 5 % 50 mL IVPB  Status:  Discontinued     1 g 100 mL/hr over 30 Minutes Intravenous Every 24 hours 11/18/16 1429 11/25/16 1015   11/11/16 2000  ciprofloxacin (CIPRO) tablet 750 mg     750 mg Per Tube 2 times daily 11/11/16 0957 11/11/16 2010   11/08/2016 1600  ciprofloxacin (CIPRO) tablet 750 mg  Status:  Discontinued     750 mg Oral 2 times daily 11/21/2016 1331 11/11/16 0957   11/02/16 1400  cefTRIAXone (ROCEPHIN) 1 g in dextrose 5 % 50 mL IVPB  Status:  Discontinued     1 g 100 mL/hr  over 30 Minutes Intravenous Every 24 hours 11/02/16 1254 11/23/2016 1328   11/01/16 1000  vancomycin (VANCOCIN) IVPB 1000 mg/200 mL premix  Status:  Discontinued     1,000 mg 200 mL/hr over 60 Minutes Intravenous Every 12 hours 11/01/16 0818 11/03/16 1026   10/31/16 1600  vancomycin (VANCOCIN) IVPB 750 mg/150 ml premix  Status:  Discontinued     750 mg 150 mL/hr over 60 Minutes Intravenous Every 24 hours 10/14/2016 1624 11/01/16 0818   10/31/16 0000  piperacillin-tazobactam (ZOSYN) IVPB 3.375 g  Status:  Discontinued     3.375 g 12.5 mL/hr over 240 Minutes Intravenous Every 8 hours 10/18/2016 1623 11/02/16 1254  10/14/2016 1600  piperacillin-tazobactam (ZOSYN) IVPB 3.375 g     3.375 g 100 mL/hr over 30 Minutes Intravenous  Once 10/18/2016 1553 11/02/2016 1637   10/18/2016 1600  vancomycin (VANCOCIN) IVPB 1000 mg/200 mL premix     1,000 mg 200 mL/hr over 60 Minutes Intravenous  Once 11/01/2016 1553 10/29/2016 1731        Objective:   Vitals:   12/09/16 1137 12/09/16 1233 12/09/16 1335 12/09/16 1336  BP:      Pulse: (!) 114     Resp: (!) 34     Temp:  100.3 F (37.9 C)    TempSrc:  Axillary    SpO2: 93%  94% 94%  Weight:      Height:        Wt Readings from Last 3 Encounters:  12/08/16 70.8 kg (156 lb)  08/10/16 64.7 kg (142 lb 10.2 oz)  07/11/16 60.3 kg (133 lb)     Intake/Output Summary (Last 24 hours) at 12/09/16 1820 Last data filed at 12/09/16 1517  Gross per 24 hour  Intake          3043.42 ml  Output             1300 ml  Net          1743.42 ml     Physical Exam  Gen:- Unresponsive  HEENT:-  No sclera icterus Neck- trach Lungs-  CTAB anteriorly  CV- S1, S2 normal Abd-  +ve B.Sounds, Abd Soft, PEG tube site is clean dry and intact    Extremity/Skin:- Dense right hemiplegia  Data Review:   Micro Results No results found for this or any previous visit (from the past 240 hour(s)).  Radiology Reports Ct Chest Wo Contrast  Result Date: 11/22/2016 CLINICAL DATA:  Left  chest opacification EXAM: CT CHEST WITHOUT CONTRAST TECHNIQUE: Multidetector CT imaging of the chest was performed following the standard protocol without IV contrast. COMPARISON:  Multiple chest radiographs most recently yesterday. FINDINGS: Cardiovascular: Heart size is normal. Very small amount of pericardial fluid. No aortic or coronary calcification identified. Mediastinum/Nodes: Mediastinal shift towards the right. No pathologically enlarged lymph nodes identified. Lungs/Pleura: Complete collapse of left lung. Large pleural effusion surrounding the left lung. Mass effect with mediastinal shift towards the right. Moderate-sized dependent effusion on the right with dependent pulmonary atelectasis. Nondependent lung is largely clear. Abnormal density in the right upper lobe suggests developing bronchopneumonia. Focal areas of irregular nodular shadows within the right lung are nonspecific and could represent foci of atelectasis or pneumonia. Neoplastic mass lesions seem unlikely given the generally clear previous chest radiography on the right. Upper Abdomen: Negative Musculoskeletal: Normal IMPRESSION: Very large left effusion. Complete collapse of left lung. Mass effect with left-to-right mediastinal shift. Moderate size right effusion layering dependently. Dependent pulmonary atelectasis. Possible developing pneumonia right upper lobe. Areas of patchy pulmonary density in the right lung could represent atelectasis or nodular infiltrates. Mass lesions seem less likely, but follow-up should be performed. Electronically Signed   By: Nelson Chimes M.D.   On: 11/22/2016 07:18   Dg Chest Port 1 View  Result Date: 12/09/2016 CLINICAL DATA:  Respiratory failure EXAM: PORTABLE CHEST 1 VIEW COMPARISON:  12/07/2016 FINDINGS: Cardiac shadow is stable. Tracheostomy tube and right-sided PICC line are again seen and stable. There is been interval removal of the left-sided chest tube without recurrent pneumothorax.  Bilateral pleural effusions are again seen and stable. No new focal infiltrate is seen. IMPRESSION: Stable bilateral pleural effusions. Interval chest  tube removal on the left without recurrent pneumothorax. Tubes and lines as described. Electronically Signed   By: Alcide Clever M.D.   On: 12/09/2016 07:53   Dg Chest Port 1 View  Result Date: 12/07/2016 CLINICAL DATA:  Pneumothorax. EXAM: PORTABLE CHEST 1 VIEW COMPARISON:  Radiograph May 05, 2017. FINDINGS: Stable cardiomediastinal silhouette. Endotracheal tube is unchanged in position. Right-sided PICC line is again noted and unchanged. Left-sided chest tube is unchanged. No pneumothorax is noted. Stable probable small loculated pleural effusion along left lateral chest wall. Probable layering effusion seen in right lung. Stable left upper lobe opacity concerning for atelectasis or infiltrate. Bony thorax is unremarkable. IMPRESSION: Stable support apparatus. No pneumothorax is noted. Stable probable loculated left pleural effusion along left chest wall. Stable probable right pleural effusion. Stable left upper lobe opacity concerning for atelectasis or infiltrate. Electronically Signed   By: Lupita Raider, M.D.   On: 12/07/2016 07:42   Dg Chest Port 1 View  Result Date: 12/05/2016 CLINICAL DATA:  Followup exam for pleural effusion in possible pneumothorax. Left chest tube. EXAM: PORTABLE CHEST 1 VIEW COMPARISON:  11/23/2016 FINDINGS: Left chest tube is stable with its tip at the left apex. No pneumothorax. Airspace consolidation is noted on the left most apparent peripherally. There is a small residual left pleural effusion. Hazy airspace opacity has developed throughout most of the right lung. Probable small right pleural effusion. Right PICC and tracheostomy tube are stable and well positioned. IMPRESSION: 1. Worsened lung aeration on the right with new hazy airspace opacity throughout most of the right lung. Probable small right pleural effusion. 2.  Persistent airspace opacities in the left lung with a small residual pleural effusion. 3. Stable chest tube.  No pneumothorax. Electronically Signed   By: Amie Portland M.D.   On: 12/05/2016 07:22   Dg Chest Port 1 View  Result Date: 11/23/2016 CLINICAL DATA:  Status post chest tube placement EXAM: PORTABLE CHEST 1 VIEW COMPARISON:  11/22/2016 FINDINGS: Recent chest tube placement has been performed. No pneumothorax is noted. Significant reduction in pleural fluid is seen. There is also significant improved aeration in the left lung. Persistent infiltrate is noted. Right PICC line and tracheostomy tube are again noted. IMPRESSION: Status post chest tube placement with significant re-expansion of the left lung and reduction in left pleural effusion. Electronically Signed   By: Alcide Clever M.D.   On: 11/23/2016 15:51   Dg Chest Portable 1 View  Result Date: 11/21/2016 CLINICAL DATA:  Pleural effusion.  Tracheostomy tube . EXAM: PORTABLE CHEST 1 VIEW COMPARISON:  11/20/2016. FINDINGS: Tracheostomy tube noted in stable position. Right PICC line noted in stable position. Complete opacification of the left hemithorax again noted. Shift of the mediastinum heart the right noted. These findings again suggest a left pleural effusion. Underlying left lung consolidation cannot be excluded. IMPRESSION: 1. Tracheostomy tube in stable position. 2. Unchanged chest with complete opacification of the left hemithorax. Findings consistent with left pleural effusion. Underlying left lung consolidation cannot be excluded . Electronically Signed   By: Maisie Fus  Register   On: 11/21/2016 10:24   Dg Chest Port 1 View  Result Date: 11/20/2016 CLINICAL DATA:  Pleural effusion versus mucous plugging. Left lung white out. EXAM: PORTABLE CHEST 1 VIEW COMPARISON:  11/19/2016 FINDINGS: Tracheostomy tube and right PICC line remain in place, unchanged. Complete opacification of the left hemithorax again noted with shift of the  mediastinal structures to the right suggesting at least component of pleural effusion. No confluent  opacity on the right. IMPRESSION: No change since prior study. Electronically Signed   By: Rolm Baptise M.D.   On: 11/20/2016 08:08   Dg Chest Port 1 View  Result Date: 11/19/2016 CLINICAL DATA:  Collapse left lung EXAM: PORTABLE CHEST 1 VIEW COMPARISON:  11/18/2016 FINDINGS: Tracheostomy tube and right PICC line remain in place, unchanged. Complete opacification of the left hemithorax is stable. Shift of mediastinal structures to the right. Mild vascular congestion on the right. No visible effusion on the right. IMPRESSION: Complete opacification of the left hemithorax with mediastinal shift to the right. No change since prior study. Electronically Signed   By: Rolm Baptise M.D.   On: 11/19/2016 07:57   Dg Chest Port 1 View  Result Date: 11/18/2016 CLINICAL DATA:  Atelectasis. EXAM: PORTABLE CHEST 1 VIEW COMPARISON:  Radiograph of November 17, 2016. FINDINGS: Stable complete opacification of left hemithorax without significant mediastinal shift. Tracheostomy tube is unchanged in position. Right-sided PICC line is unchanged. No pneumothorax is noted. Bony thorax is unremarkable. Gastrostomy tube is seen in visualized portion of upper abdomen. IMPRESSION: Stable complete opacification of the left hemithorax without significant mediastinal shift. This is concerning for pleural effusion. Electronically Signed   By: Marijo Conception, M.D.   On: 11/18/2016 14:12   Dg Chest Port 1 View  Result Date: 11/17/2016 CLINICAL DATA:  Acute hypoxemic respiratory failure portable chest x-ray of 11/16/2016 EXAM: PORTABLE CHEST 1 VIEW COMPARISON:  Chest x-ray of 11/16/2016, 08/10/2016, and 02/13/2016 FINDINGS: There is little change in complete opacification of the left hemithorax. No significant mediastinal shift is noted therefore some of the opacification may be due to atelectasis as well as fluid or mass effect. The  right lung is grossly clear. Tracheostomy and right central venous line remain. IMPRESSION: 1. No change in complete opacification of the left hemithorax with no significant mediastinal shift. 2. Right lung is clear. 3. Tracheostomy and right central venous line are unchanged in position. Electronically Signed   By: Ivar Drape M.D.   On: 11/17/2016 08:06   Dg Chest Port 1 View  Result Date: 11/16/2016 CLINICAL DATA:  Acute hypoxia today EXAM: PORTABLE CHEST 1 VIEW COMPARISON:  Portable chest x-ray of November 15, 2016 FINDINGS: There is persistent complete opacification of the left hemithorax. There is mild shift of mediastinum toward the right. The lung markings throughout the right lung are more conspicuous today. The pulmonary vascularity is not clearly engorged. The tracheostomy appliance tip projects at the level of the clavicular heads. The PICC line tip projects over the proximal SVC. IMPRESSION: Complete opacification of the left hemithorax, stable. Mild interstitial prominence throughout the right lung which is new and may reflect interstitial edema or less likely pneumonia. Electronically Signed   By: David  Martinique M.D.   On: 11/16/2016 15:08   Dg Chest Port 1 View  Result Date: 11/15/2016 CLINICAL DATA:  Pneumonia EXAM: PORTABLE CHEST 1 VIEW COMPARISON:  11/05/2016 FINDINGS: Tracheostomy remains in good position. Complete opacification left hemithorax compatible with further collapse compared with the prior study. Heart is shifted mildly to the right suggesting underlying left pleural effusion. Right lung clear.  Right arm PICC tip in the SVC. IMPRESSION: Further collapse of the left lung with complete opacification of the left chest. Mild shift of the mediastinal structures to the right. Endobronchial obstruction due to mucous plugging or tumor possible. Underlying pneumonia, tumor, or empyema are possible. Electronically Signed   By: Franchot Gallo M.D.   On: 11/15/2016 08:31  CBC  Recent Labs Lab 12/06/16 0405 12/08/16 0458  WBC 13.6* 15.0*  HGB 8.4* 8.0*  HCT 26.9* 25.9*  PLT 371 367  MCV 91.5 91.5  MCH 28.6 28.3  MCHC 31.2 30.9  RDW 18.9* 19.3*    Chemistries   Recent Labs Lab 12/03/16 0443 12/04/16 0800 12/06/16 0405 12/08/16 0458  NA 147*  --  140 140  K 3.0* 2.9* 3.4* 4.3  CL 103  --  100* 106  CO2 33*  --  30 25  GLUCOSE 92  --  103* 80  BUN 11  --  12 13  CREATININE 0.63  --  0.64 0.74  CALCIUM 8.8*  --  8.6* 8.5*  MG 1.7 1.8  --  1.6*  AST  --   --  20  --   ALT  --   --  13*  --   ALKPHOS  --   --  54  --   BILITOT  --   --  0.3  --    ------------------------------------------------------------------------------------------------------------------ No results for input(s): CHOL, HDL, LDLCALC, TRIG, CHOLHDL, LDLDIRECT in the last 72 hours.  Lab Results  Component Value Date   HGBA1C 5.3 05/20/2016   ------------------------------------------------------------------------------------------------------------------ No results for input(s): TSH, T4TOTAL, T3FREE, THYROIDAB in the last 72 hours.  Invalid input(s): FREET3 ------------------------------------------------------------------------------------------------------------------ No results for input(s): VITAMINB12, FOLATE, FERRITIN, TIBC, IRON, RETICCTPCT in the last 72 hours.  Coagulation profile No results for input(s): INR, PROTIME in the last 168 hours.  No results for input(s): DDIMER in the last 72 hours.  Cardiac Enzymes No results for input(s): CKMB, TROPONINI, MYOGLOBIN in the last 168 hours.  Invalid input(s): CK ------------------------------------------------------------------------------------------------------------------    Component Value Date/Time   BNP 23.8 03/20/2016 1800     Halo Laski M.D on 12/09/2016 at 6:20 PM  Between 7am to 7pm - Pager - [phone]  After 7pm go to [email or web address] - password [email or web address]  [facility] -  Office   [phone]  Dragon dictation system was used to create this note, attempts have been made to correct errors, however presence of uncorrected errors is not a reflection quality of care provided

## 2016-12-10 LAB — GLUCOSE, CAPILLARY
GLUCOSE-CAPILLARY: 105 mg/dL — AB (ref 65–99)
GLUCOSE-CAPILLARY: 100 mg/dL — AB (ref 65–99)
GLUCOSE-CAPILLARY: 95 mg/dL (ref 65–99)
Glucose-Capillary: 100 mg/dL — ABNORMAL HIGH (ref 65–99)
Glucose-Capillary: 90 mg/dL (ref 65–99)
Glucose-Capillary: 95 mg/dL (ref 65–99)

## 2016-12-10 NOTE — Progress Notes (Signed)
Patient Demographics:    Dale [name], is a 51 y.o. male, DOB - 09-Aug-1966, GNF:[id number]  Admit date - 11/02/2016   Admitting Physician Rush Landmark, MD  Outpatient Primary MD for the patient is No PCP Per Patient  LOS - 63   Chief Complaint  Patient presents with  . Diarrhea        Subjective:    Dale [name] today has no fevers, no emesis,  Tolerating PEG tube feeding well   Assessment  & Plan :    Active Problems:   Adrenal insufficiency (Addison's disease) (HCC)   Respiratory failure (HCC)   Renal failure   Pressure injury of skin   Acute respiratory failure with hypoxia (HCC)   ARDS (adult respiratory distress syndrome) (HCC)   Severe sepsis (HCC)   Chronic anoxic encephalopathy (HCC)   Systolic and diastolic CHF, chronic (HCC)   Mucus plugging of bronchi   Abdominal distension   Collapse of left lung   Encephalopathy chronic   Adrenal insufficiency (HCC)   Severe malnutrition (HCC)   Chest tube in place   Sepsis due to pneumonia ([place])   Severe protein-calorie malnutrition ([place])  Interval history: 51 y.o.BM with PMHxCerebral septic emboli ; Cerebral thrombosis with cerebral infarction (03/25/2016), Convulsions/seizures (05/04/2016); Dementia (04/27/2016); Depression; Dysphagia (05/04/2016); ETOH abuse;Tobacco abuse , Hydrocephalus Acute encephalopathy;Meningitis, Acute ischemic stroke,Cardiac arrest; Cardiogenic shock, NSTEMI, Chronic Diastolic and Systolic CHF  Admission in March 2017with strep pneumo meningitis c/b cardiac arrest and cardiogenic shock, ARDS requiring prolonged intubation and trach (now decannulated), and acute renal failure. He was admitted to [facility] 7/13/17through8/7/17 for recurrent hypoglycemia due to adrenal insufficiency, new subacute CVA, seizures with inability to protect airway s/p intubation 7/13 through 7/21. He re-presented8/08 again with  sepsis from UTI, and was intubated due to AMS, and wastrached 8/18, and was discharged with a cuffless trach. That hospitalization was c/b MRSA bacteremia 8/24, and completed 3 weeks of IV vanc. He also developed C. Diff, and was treated with PO vanc through 9/11. He has had continued autonomic dysreflexia, which worsens during complications of acute illnesses.  Prior to this presentation, he had been well, although he was decannulated for reasons that are unclear. He was brought to the ED with decreased level of consciousness and nausea/vomiting/diarrhea. A CXR showed consolidation of the left upper lobe. He was intubated and transferred to [facility].  Plan:- 1)Acute hypoxic respiratory failure secondary to Enterobacter HCAP w/ ARDS and Sepsis- Status post tracheostomy for the third time - trach care per PCCM with no plans to decannulate - sepsis and acute infection appear to have cleared - tolerating trach collar, Continue to titrate down 02 but maintain SPO2 goal>93% , left-sided chest tube discontinued on 12/08/2016. 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 Dense right hemiplegia  4)Chronic Systolic and Diastolic CHF/Transient Afib--EF 40% Aug 2017 , - no evidence of volume overload at this time   5)HYpotension- suspect secondary to loose stools, responded well to IV fluids, loose stools probably secondary to PEG tube feedings  6)Social/Ethics- overall prognosis is poor, patient has no significant cognitive functions, remains  unresponsive. Wife is primary contact  Code Status : DNR   Disposition Plan  :  SNF Vs LTAC,,,, Apparently patient needs to be down to FiO2 of 28% prior to being able to go to nursing home, is currently requiring trach collar oxygen FiO2 40%   Consults  :  Palliative care and ethics commitee  DVT Prophylaxis  :     SCDs  Lab Results  Component Value Date   PLT 367 12/08/2016    Inpatient Medications  Scheduled Meds: . amiodarone  200 mg Oral BID  . atorvastatin  20 mg Per Tube q1800  . budesonide (PULMICORT) nebulizer solution  0.5 mg Nebulization BID  . chlorhexidine gluconate (MEDLINE KIT)  15 mL Mouth Rinse BID  . clonazePAM  0.25 mg Per Tube Q6H  . famotidine  20 mg Per Tube BID  . fludrocortisone  0.1 mg Per Tube Daily  . folic acid  1 mg Per Tube Daily  . free water  300 mL Per Tube Q4H  . heparin  5,000 Units Subcutaneous Q8H  . hydrocortisone  10 mg Per Tube QHS  . hydrocortisone  5 mg Per Tube Daily  . ipratropium  0.5 mg Nebulization TID  . levalbuterol  0.63 mg Nebulization TID  . levETIRAcetam  1,500 mg Per Tube BID  . mouth rinse  15 mL Mouth Rinse QID  . metoprolol tartrate  25 mg Per Tube BID  . QUEtiapine  50 mg Per Tube BID  . thiamine  100 mg Per Tube Daily  . Valproate Sodium  750 mg Per Tube Q8H   Continuous Infusions: . feeding supplement (VITAL AF 1.2 CAL) 1,000 mL (12/10/16 0651)   PRN Meds:.acetaminophen, fentaNYL (SUBLIMAZE) injection, levalbuterol, LORazepam, sodium chloride flush    Anti-infectives    Start     Dose/Rate Route Frequency Ordered Stop   11/25/16 1500  cefTRIAXone (ROCEPHIN) 1 g in dextrose 5 % 50 mL IVPB     1 g 100 mL/hr over 30 Minutes Intravenous Every 24 hours 11/25/16 1015 11/25/16 1724   11/18/16 1500  cefTRIAXone (ROCEPHIN) 1 g in dextrose 5 % 50 mL IVPB  Status:  Discontinued     1 g 100 mL/hr over 30 Minutes Intravenous Every 24 hours 11/18/16 1429 11/25/16 1015   11/11/16 2000  ciprofloxacin (CIPRO)  tablet 750 mg     750 mg Per Tube 2 times daily 11/11/16 0957 11/11/16 2010   11/17/2016 1600  ciprofloxacin (CIPRO) tablet 750 mg  Status:  Discontinued     750 mg Oral 2 times daily 11/18/2016 1331 11/11/16 0957   11/02/16 1400  cefTRIAXone (ROCEPHIN) 1 g in dextrose 5 % 50 mL IVPB  Status:  Discontinued     1 g 100 mL/hr over 30 Minutes Intravenous Every 24 hours 11/02/16 1254 11/13/2016 1328   11/01/16 1000  vancomycin (VANCOCIN) IVPB 1000 mg/200 mL premix  Status:  Discontinued     1,000 mg 200 mL/hr over 60 Minutes Intravenous Every 12 hours 11/01/16 0818 11/03/16 1026   10/31/16 1600  vancomycin (VANCOCIN) IVPB 750 mg/150 ml premix  Status:  Discontinued     750 mg 150 mL/hr over 60 Minutes  Intravenous Every 24 hours 10/06/2016 1624 11/01/16 0818   10/31/16 0000  piperacillin-tazobactam (ZOSYN) IVPB 3.375 g  Status:  Discontinued     3.375 g 12.5 mL/hr over 240 Minutes Intravenous Every 8 hours 11/02/2016 1623 11/02/16 1254   10/17/2016 1600  piperacillin-tazobactam (ZOSYN) IVPB 3.375 g     3.375 g 100 mL/hr over 30 Minutes Intravenous  Once 10/10/2016 1553 10/25/2016 1637   10/26/2016 1600  vancomycin (VANCOCIN) IVPB 1000 mg/200 mL premix     1,000 mg 200 mL/hr over 60 Minutes Intravenous  Once 10/11/2016 1553 10/18/2016 1731        Objective:   Vitals:   12/10/16 0406 12/10/16 0426 12/10/16 0800 12/10/16 0828  BP: 117/86   132/82  Pulse: 82 78  79  Resp: (!) 25 (!) 21  (!) 23  Temp: 97.8 F (36.6 C)   98.4 F (36.9 C)  TempSrc: Oral  Oral Oral  SpO2: 97% 99%  99%  Weight: 68 kg (150 lb)     Height:        Wt Readings from Last 3 Encounters:  12/10/16 68 kg (150 lb)  08/10/16 64.7 kg (142 lb 10.2 oz)  07/11/16 60.3 kg (133 lb)     Intake/Output Summary (Last 24 hours) at 12/10/16 0909 Last data filed at 12/10/16 0800  Gross per 24 hour  Intake             2490 ml  Output             2400 ml  Net               90 ml     Physical Exam  Gen:-Unresponsive HEENT:- Rich.AT, No  sclera icterus Neck-trach with trach collar , yellow secretions noted Lungs-  rhonchorous anteriorly CV- S1, S2 normal Abd-  +ve B.Sounds, Abd Soft, PEG tube site is clean dry and intact  Extremity/Skin:- Right-sided hemiplegia, moves left side spontaneously from time to time, no purposeful movements   Data Review:   Micro Results No results found for this or any previous visit (from the past 240 hour(s)).  Radiology Reports Ct Chest Wo Contrast  Result Date: 11/22/2016 CLINICAL DATA:  Left chest opacification EXAM: CT CHEST WITHOUT CONTRAST TECHNIQUE: Multidetector CT imaging of the chest was performed following the standard protocol without IV contrast. COMPARISON:  Multiple chest radiographs most recently yesterday. FINDINGS: Cardiovascular: Heart size is normal. Very small amount of pericardial fluid. No aortic or coronary calcification identified. Mediastinum/Nodes: Mediastinal shift towards the right. No pathologically enlarged lymph nodes identified. Lungs/Pleura: Complete collapse of left lung. Large pleural effusion surrounding the left lung. Mass effect with mediastinal shift towards the right. Moderate-sized dependent effusion on the right with dependent pulmonary atelectasis. Nondependent lung is largely clear. Abnormal density in the right upper lobe suggests developing bronchopneumonia. Focal areas of irregular nodular shadows within the right lung are nonspecific and could represent foci of atelectasis or pneumonia. Neoplastic mass lesions seem unlikely given the generally clear previous chest radiography on the right. Upper Abdomen: Negative Musculoskeletal: Normal IMPRESSION: Very large left effusion. Complete collapse of left lung. Mass effect with left-to-right mediastinal shift. Moderate size right effusion layering dependently. Dependent pulmonary atelectasis. Possible developing pneumonia right upper lobe. Areas of patchy pulmonary density in the right lung could represent  atelectasis or nodular infiltrates. Mass lesions seem less likely, but follow-up should be performed. Electronically Signed   By: Nelson Chimes M.D.   On: 11/22/2016 07:18   Dg Chest  Port 1 View  Result Date: 12/09/2016 CLINICAL DATA:  Respiratory failure EXAM: PORTABLE CHEST 1 VIEW COMPARISON:  12/07/2016 FINDINGS: Cardiac shadow is stable. Tracheostomy tube and right-sided PICC line are again seen and stable. There is been interval removal of the left-sided chest tube without recurrent pneumothorax. Bilateral pleural effusions are again seen and stable. No new focal infiltrate is seen. IMPRESSION: Stable bilateral pleural effusions. Interval chest tube removal on the left without recurrent pneumothorax. Tubes and lines as described. Electronically Signed   By: Inez Catalina M.D.   On: 12/09/2016 07:53   Dg Chest Port 1 View  Result Date: 12/07/2016 CLINICAL DATA:  Pneumothorax. EXAM: PORTABLE CHEST 1 VIEW COMPARISON:  Radiograph May 05, 2017. FINDINGS: Stable cardiomediastinal silhouette. Endotracheal tube is unchanged in position. Right-sided PICC line is again noted and unchanged. Left-sided chest tube is unchanged. No pneumothorax is noted. Stable probable small loculated pleural effusion along left lateral chest wall. Probable layering effusion seen in right lung. Stable left upper lobe opacity concerning for atelectasis or infiltrate. Bony thorax is unremarkable. IMPRESSION: Stable support apparatus. No pneumothorax is noted. Stable probable loculated left pleural effusion along left chest wall. Stable probable right pleural effusion. Stable left upper lobe opacity concerning for atelectasis or infiltrate. Electronically Signed   By: Marijo Conception, M.D.   On: 12/07/2016 07:42   Dg Chest Port 1 View  Result Date: 12/05/2016 CLINICAL DATA:  Followup exam for pleural effusion in possible pneumothorax. Left chest tube. EXAM: PORTABLE CHEST 1 VIEW COMPARISON:  11/23/2016 FINDINGS: Left chest tube is  stable with its tip at the left apex. No pneumothorax. Airspace consolidation is noted on the left most apparent peripherally. There is a small residual left pleural effusion. Hazy airspace opacity has developed throughout most of the right lung. Probable small right pleural effusion. Right PICC and tracheostomy tube are stable and well positioned. IMPRESSION: 1. Worsened lung aeration on the right with new hazy airspace opacity throughout most of the right lung. Probable small right pleural effusion. 2. Persistent airspace opacities in the left lung with a small residual pleural effusion. 3. Stable chest tube.  No pneumothorax. Electronically Signed   By: Lajean Manes M.D.   On: 12/05/2016 07:22   Dg Chest Port 1 View  Result Date: 11/23/2016 CLINICAL DATA:  Status post chest tube placement EXAM: PORTABLE CHEST 1 VIEW COMPARISON:  11/22/2016 FINDINGS: Recent chest tube placement has been performed. No pneumothorax is noted. Significant reduction in pleural fluid is seen. There is also significant improved aeration in the left lung. Persistent infiltrate is noted. Right PICC line and tracheostomy tube are again noted. IMPRESSION: Status post chest tube placement with significant re-expansion of the left lung and reduction in left pleural effusion. Electronically Signed   By: Inez Catalina M.D.   On: 11/23/2016 15:51   Dg Chest Portable 1 View  Result Date: 11/21/2016 CLINICAL DATA:  Pleural effusion.  Tracheostomy tube . EXAM: PORTABLE CHEST 1 VIEW COMPARISON:  11/20/2016. FINDINGS: Tracheostomy tube noted in stable position. Right PICC line noted in stable position. Complete opacification of the left hemithorax again noted. Shift of the mediastinum heart the right noted. These findings again suggest a left pleural effusion. Underlying left lung consolidation cannot be excluded. IMPRESSION: 1. Tracheostomy tube in stable position. 2. Unchanged chest with complete opacification of the left hemithorax.  Findings consistent with left pleural effusion. Underlying left lung consolidation cannot be excluded . Electronically Signed   By: Marcello Moores  Register   On:  11/21/2016 10:24   Dg Chest Port 1 View  Result Date: 11/20/2016 CLINICAL DATA:  Pleural effusion versus mucous plugging. Left lung white out. EXAM: PORTABLE CHEST 1 VIEW COMPARISON:  11/19/2016 FINDINGS: Tracheostomy tube and right PICC line remain in place, unchanged. Complete opacification of the left hemithorax again noted with shift of the mediastinal structures to the right suggesting at least component of pleural effusion. No confluent opacity on the right. IMPRESSION: No change since prior study. Electronically Signed   By: Rolm Baptise M.D.   On: 11/20/2016 08:08   Dg Chest Port 1 View  Result Date: 11/19/2016 CLINICAL DATA:  Collapse left lung EXAM: PORTABLE CHEST 1 VIEW COMPARISON:  11/18/2016 FINDINGS: Tracheostomy tube and right PICC line remain in place, unchanged. Complete opacification of the left hemithorax is stable. Shift of mediastinal structures to the right. Mild vascular congestion on the right. No visible effusion on the right. IMPRESSION: Complete opacification of the left hemithorax with mediastinal shift to the right. No change since prior study. Electronically Signed   By: Rolm Baptise M.D.   On: 11/19/2016 07:57   Dg Chest Port 1 View  Result Date: 11/18/2016 CLINICAL DATA:  Atelectasis. EXAM: PORTABLE CHEST 1 VIEW COMPARISON:  Radiograph of November 17, 2016. FINDINGS: Stable complete opacification of left hemithorax without significant mediastinal shift. Tracheostomy tube is unchanged in position. Right-sided PICC line is unchanged. No pneumothorax is noted. Bony thorax is unremarkable. Gastrostomy tube is seen in visualized portion of upper abdomen. IMPRESSION: Stable complete opacification of the left hemithorax without significant mediastinal shift. This is concerning for pleural effusion. Electronically Signed    By: Marijo Conception, M.D.   On: 11/18/2016 14:12   Dg Chest Port 1 View  Result Date: 11/17/2016 CLINICAL DATA:  Acute hypoxemic respiratory failure portable chest x-ray of 11/16/2016 EXAM: PORTABLE CHEST 1 VIEW COMPARISON:  Chest x-ray of 11/16/2016, 08/10/2016, and 02/13/2016 FINDINGS: There is little change in complete opacification of the left hemithorax. No significant mediastinal shift is noted therefore some of the opacification may be due to atelectasis as well as fluid or mass effect. The right lung is grossly clear. Tracheostomy and right central venous line remain. IMPRESSION: 1. No change in complete opacification of the left hemithorax with no significant mediastinal shift. 2. Right lung is clear. 3. Tracheostomy and right central venous line are unchanged in position. Electronically Signed   By: Ivar Drape M.D.   On: 11/17/2016 08:06   Dg Chest Port 1 View  Result Date: 11/16/2016 CLINICAL DATA:  Acute hypoxia today EXAM: PORTABLE CHEST 1 VIEW COMPARISON:  Portable chest x-ray of November 15, 2016 FINDINGS: There is persistent complete opacification of the left hemithorax. There is mild shift of mediastinum toward the right. The lung markings throughout the right lung are more conspicuous today. The pulmonary vascularity is not clearly engorged. The tracheostomy appliance tip projects at the level of the clavicular heads. The PICC line tip projects over the proximal SVC. IMPRESSION: Complete opacification of the left hemithorax, stable. Mild interstitial prominence throughout the right lung which is new and may reflect interstitial edema or less likely pneumonia. Electronically Signed   By: David  Martinique M.D.   On: 11/16/2016 15:08   Dg Chest Port 1 View  Result Date: 11/15/2016 CLINICAL DATA:  Pneumonia EXAM: PORTABLE CHEST 1 VIEW COMPARISON:  11/05/2016 FINDINGS: Tracheostomy remains in good position. Complete opacification left hemithorax compatible with further collapse compared  with the prior study. Heart is shifted mildly to the  right suggesting underlying left pleural effusion. Right lung clear.  Right arm PICC tip in the SVC. IMPRESSION: Further collapse of the left lung with complete opacification of the left chest. Mild shift of the mediastinal structures to the right. Endobronchial obstruction due to mucous plugging or tumor possible. Underlying pneumonia, tumor, or empyema are possible. Electronically Signed   By: Franchot Gallo M.D.   On: 11/15/2016 08:31     CBC  Recent Labs Lab 12/06/16 0405 12/08/16 0458  WBC 13.6* 15.0*  HGB 8.4* 8.0*  HCT 26.9* 25.9*  PLT 371 367  MCV 91.5 91.5  MCH 28.6 28.3  MCHC 31.2 30.9  RDW 18.9* 19.3*    Chemistries   Recent Labs Lab 12/04/16 0800 12/06/16 0405 12/08/16 0458  NA  --  140 140  K 2.9* 3.4* 4.3  CL  --  100* 106  CO2  --  30 25  GLUCOSE  --  103* 80  BUN  --  12 13  CREATININE  --  0.64 0.74  CALCIUM  --  8.6* 8.5*  MG 1.8  --  1.6*  AST  --  20  --   ALT  --  13*  --   ALKPHOS  --  54  --   BILITOT  --  0.3  --    ------------------------------------------------------------------------------------------------------------------ No results for input(s): CHOL, HDL, LDLCALC, TRIG, CHOLHDL, LDLDIRECT in the last 72 hours.  Lab Results  Component Value Date   HGBA1C 5.3 05/20/2016   ------------------------------------------------------------------------------------------------------------------ No results for input(s): TSH, T4TOTAL, T3FREE, THYROIDAB in the last 72 hours.  Invalid input(s): FREET3 ------------------------------------------------------------------------------------------------------------------ No results for input(s): VITAMINB12, FOLATE, FERRITIN, TIBC, IRON, RETICCTPCT in the last 72 hours.  Coagulation profile No results for input(s): INR, PROTIME in the last 168 hours.  No results for input(s): DDIMER in the last 72 hours.  Cardiac Enzymes No results for input(s):  CKMB, TROPONINI, MYOGLOBIN in the last 168 hours.  Invalid input(s): CK ------------------------------------------------------------------------------------------------------------------    Component Value Date/Time   BNP 23.8 03/20/2016 1800     ,  M.D on 12/10/2016 at 9:09 AM  Between 7am to 7pm - Pager - [phone]  After 7pm go to [email or web address] - password [email or web address]  [facility] -  Office  [phone]  Dragon dictation system was used to create this note, attempts have been made to correct errors, however presence of uncorrected errors is not a reflection quality of care provided

## 2016-12-11 LAB — GLUCOSE, CAPILLARY
GLUCOSE-CAPILLARY: 102 mg/dL — AB (ref 65–99)
GLUCOSE-CAPILLARY: 108 mg/dL — AB (ref 65–99)
GLUCOSE-CAPILLARY: 92 mg/dL (ref 65–99)
Glucose-Capillary: 110 mg/dL — ABNORMAL HIGH (ref 65–99)
Glucose-Capillary: 98 mg/dL (ref 65–99)
Glucose-Capillary: 87 mg/dL (ref 65–99)

## 2016-12-11 LAB — LACTIC ACID, PLASMA: LACTIC ACID, VENOUS: 0.9 mmol/L (ref 0.5–1.9)

## 2016-12-11 MED ORDER — SODIUM CHLORIDE 0.9 % IV BOLUS (SEPSIS)
1000.0000 mL | Freq: Once | INTRAVENOUS | Status: AC
  Administered 2016-12-11: 1000 mL via INTRAVENOUS

## 2016-12-11 MED ORDER — IPRATROPIUM-ALBUTEROL 0.5-2.5 (3) MG/3ML IN SOLN
3.0000 mL | Freq: Three times a day (TID) | RESPIRATORY_TRACT | Status: DC
  Administered 2016-12-11 – 2016-12-16 (×15): 3 mL via RESPIRATORY_TRACT
  Filled 2016-12-11 (×15): qty 3

## 2016-12-11 MED ORDER — SODIUM CHLORIDE 0.9 % IV BOLUS (SEPSIS)
250.0000 mL | Freq: Once | INTRAVENOUS | Status: AC
  Administered 2016-12-11: 250 mL via INTRAVENOUS

## 2016-12-11 NOTE — Progress Notes (Signed)
Attempted .28 ATC. Sp02 = 89 Returned Pt to .35 Aerosol Trach Collar to keep Sp02 > 93% as instructed in note. Please consider order to keep Sp02 > 88% Thank you.

## 2016-12-11 NOTE — Progress Notes (Signed)
Patient Demographics:    Dale [name], is a 51 y.o. male, DOB - October 03, 1966, CWU:[id number]  Admit date - 10/24/2016   Admitting Physician Rush Landmark, MD  Outpatient Primary MD for the patient is No PCP Per Patient  LOS - 66   Chief Complaint  Patient presents with  . Diarrhea        Subjective:    Dale [name] today has no fevers, no emesis,  Remains unresponsive without any purposefull movements   Assessment  & Plan :    Active Problems:   Adrenal insufficiency (Addison's disease) (HCC)   Respiratory failure (HCC)   Renal failure   Pressure injury of skin   Acute respiratory failure with hypoxia (HCC)   ARDS (adult respiratory distress syndrome) (HCC)   Severe sepsis (HCC)   Chronic anoxic encephalopathy (HCC)   Systolic and diastolic CHF, chronic (HCC)   Mucus plugging of bronchi   Abdominal distension   Collapse of left lung   Encephalopathy chronic   Adrenal insufficiency (HCC)   Severe malnutrition (HCC)   Chest tube in place   Sepsis due to pneumonia ([place])   Severe protein-calorie malnutrition ([place])  Interval history: 51 y.o.BM with PMHxCerebral septic emboli ; Cerebral thrombosis with cerebral infarction (03/25/2016), Convulsions/seizures (05/04/2016); Dementia (04/27/2016); Depression; Dysphagia (05/04/2016); ETOH abuse;Tobacco abuse , Hydrocephalus Acute encephalopathy;Meningitis, Acute ischemic stroke,Cardiac arrest; Cardiogenic shock, NSTEMI, Chronic Diastolic and Systolic CHF  Admission in March 2017with strep pneumo meningitis c/b cardiac arrest and cardiogenic shock, ARDS requiring prolonged intubation and trach (now decannulated), and acute renal failure. He was admitted to [facility] 7/13/17through8/7/17 for recurrent hypoglycemia due to adrenal insufficiency, new subacute CVA, seizures with inability to protect airway s/p intubation 7/13 through 7/21. He  re-presented8/08 again with sepsis from UTI, and was intubated due to AMS, and wastrached 8/18, and was discharged with a cuffless trach. That hospitalization was c/b MRSA bacteremia 8/24, and completed 3 weeks of IV vanc. He also developed C. Diff, and was treated with PO vanc through 9/11. He has had continued autonomic dysreflexia, which worsens during complications of acute illnesses.  Prior to this presentation, he had been well, although he was decannulated for reasons that are unclear. He was brought to the ED with decreased level of consciousness and nausea/vomiting/diarrhea. A CXR showed consolidation of the left upper lobe. He was intubated and transferred to [facility].  Plan:- 1)Acute hypoxic respiratory failure secondary to Enterobacter HCAP w/ ARDS and Sepsis- Status post tracheostomy for the third time - trach care per PCCM with no plans to decannulate - sepsis and acute infection appear to have cleared - tolerating trach collar, Continue to titrate down 02 but maintain SPO2 goal>93% , left-sided chest tube discontinued on 12/08/2016. 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 Dense right hemiplegia  4)Chronic Systolic and Diastolic CHF/Transient Afib--EF 40% Aug 2017 , - no evidence of volume overload at this time   5)HYpotension- suspect secondary to loose stools, responded well to IV fluids, loose stools probably secondary to PEG tube feedings  6)Social/Ethics- overall prognosis is poor, patient has no significant  cognitive functions, remains unresponsive. Wife is primary contact  Code Status:DNR   Disposition Plan: SNF Vs LTAC,,,, Apparently patient needs to be down to FiO2 of 28% prior to being able to go to nursing home, is currently requiring trach collar oxygen FiO2 40%   Consults :Palliative care and ethics commitee  DVT Prophylaxis: SCDs    Lab Results  Component Value Date   PLT 367 12/08/2016    Inpatient Medications  Scheduled Meds: . amiodarone  200 mg Oral BID  . atorvastatin  20 mg Per Tube q1800  . budesonide (PULMICORT) nebulizer solution  0.5 mg Nebulization BID  . chlorhexidine gluconate (MEDLINE KIT)  15 mL Mouth Rinse BID  . clonazePAM  0.25 mg Per Tube Q6H  . famotidine  20 mg Per Tube BID  . fludrocortisone  0.1 mg Per Tube Daily  . folic acid  1 mg Per Tube Daily  . free water  300 mL Per Tube Q4H  . heparin  5,000 Units Subcutaneous Q8H  . hydrocortisone  10 mg Per Tube QHS  . hydrocortisone  5 mg Per Tube Daily  . ipratropium-albuterol  3 mL Nebulization TID  . levETIRAcetam  1,500 mg Per Tube BID  . mouth rinse  15 mL Mouth Rinse QID  . metoprolol tartrate  25 mg Per Tube BID  . QUEtiapine  50 mg Per Tube BID  . thiamine  100 mg Per Tube Daily  . Valproate Sodium  750 mg Per Tube Q8H   Continuous Infusions: . feeding supplement (VITAL AF 1.2 CAL) 1,000 mL (12/10/16 2358)   PRN Meds:.acetaminophen, fentaNYL (SUBLIMAZE) injection, levalbuterol, LORazepam, sodium chloride flush    Anti-infectives    Start     Dose/Rate Route Frequency Ordered Stop   11/25/16 1500  cefTRIAXone (ROCEPHIN) 1 g in dextrose 5 % 50 mL IVPB     1 g 100 mL/hr over 30 Minutes Intravenous Every 24 hours 11/25/16 1015 11/25/16 1724   11/18/16 1500  cefTRIAXone (ROCEPHIN) 1 g in dextrose 5 % 50 mL IVPB  Status:  Discontinued     1 g 100 mL/hr over 30 Minutes Intravenous Every 24 hours 11/18/16 1429 11/25/16 1015   11/11/16 2000  ciprofloxacin (CIPRO) tablet  750 mg     750 mg Per Tube 2 times daily 11/11/16 0957 11/11/16 2010   11/12/2016 1600  ciprofloxacin (CIPRO) tablet 750 mg  Status:  Discontinued     750 mg Oral 2 times daily 11/22/2016 1331 11/11/16 0957   11/02/16 1400  cefTRIAXone (ROCEPHIN) 1 g in dextrose 5 % 50 mL IVPB  Status:  Discontinued     1 g 100 mL/hr over 30 Minutes Intravenous Every 24 hours 11/02/16 1254 11/27/2016 1328   11/01/16 1000  vancomycin (VANCOCIN) IVPB 1000 mg/200 mL premix  Status:  Discontinued     1,000 mg 200 mL/hr over 60 Minutes Intravenous Every 12 hours 11/01/16 0818 11/03/16 1026   10/31/16 1600  vancomycin (VANCOCIN) IVPB 750 mg/150 ml premix  Status:  Discontinued     750 mg 150 mL/hr over 60 Minutes Intravenous Every 24 hours 10/17/2016 1624 11/01/16 0818   10/31/16 0000  piperacillin-tazobactam (ZOSYN) IVPB 3.375 g  Status:  Discontinued     3.375 g 12.5 mL/hr over 240 Minutes Intravenous Every 8 hours 10/26/2016 1623 11/02/16 1254   10/29/2016 1600  piperacillin-tazobactam (ZOSYN) IVPB 3.375 g     3.375 g 100 mL/hr over 30 Minutes Intravenous  Once 10/23/2016 1553 10/28/2016 1637   10/28/2016 1600  vancomycin (VANCOCIN) IVPB 1000 mg/200 mL premix     1,000 mg 200 mL/hr over 60 Minutes Intravenous  Once 10/14/2016 1553 10/11/2016 1731        Objective:   Vitals:   12/11/16 1100 12/11/16 1150 12/11/16 1200 12/11/16 1537  BP: 107/75  125/85   Pulse: 78 69 74 78  Resp: (!) 23 (!) 22 19 (!) 27  Temp:   98.1 F (36.7 C)   TempSrc:   Oral   SpO2: 97% 91% 98% 92%  Weight:      Height:        Wt Readings from Last 3 Encounters:  12/11/16 68 kg (150 lb)  08/10/16 64.7 kg (142 lb 10.2 oz)  07/11/16 60.3 kg (133 lb)     Intake/Output Summary (Last 24 hours) at 12/11/16 1610 Last data filed at 12/11/16 1000  Gross per 24 hour  Intake             3685 ml  Output             3200 ml  Net              485 ml     Physical Exam  Gen:- Unresponsive HEENT:- [place].AT, No sclera icterus Neck- trach with trach  collar  Lungs-  scattered rhonchi anteriorly CV- S1, S2 normal Abd-  +ve B.Sounds, Abd Soft, PEG tube site is clean dry and intact    Extremity/Skin:- Right-sided hemiplegia,   Data Review:   Micro Results No results found for this or any previous visit (from the past 240 hour(s)).  Radiology Reports Ct Chest Wo Contrast  Result Date: 11/22/2016 CLINICAL DATA:  Left chest opacification EXAM: CT CHEST WITHOUT CONTRAST TECHNIQUE: Multidetector CT imaging of the chest was performed following the standard protocol without IV contrast. COMPARISON:  Multiple chest radiographs most recently yesterday. FINDINGS: Cardiovascular: Heart size is normal. Very small amount of pericardial fluid. No aortic or coronary calcification identified. Mediastinum/Nodes: Mediastinal shift towards the right. No pathologically enlarged lymph nodes identified. Lungs/Pleura: Complete collapse of left lung. Large pleural effusion surrounding the left lung. Mass effect with mediastinal shift towards the right. Moderate-sized dependent effusion on the right with dependent pulmonary atelectasis. Nondependent lung is largely clear. Abnormal density in the right upper lobe suggests developing bronchopneumonia. Focal areas of irregular nodular shadows within the right lung are nonspecific and could represent foci of atelectasis or pneumonia. Neoplastic mass lesions seem unlikely given the generally clear previous chest radiography on the right. Upper Abdomen: Negative Musculoskeletal: Normal IMPRESSION: Very large left effusion. Complete collapse of left lung. Mass effect with left-to-right mediastinal shift. Moderate size right effusion layering dependently. Dependent pulmonary atelectasis. Possible developing pneumonia right upper lobe. Areas of patchy pulmonary density in the right lung could represent atelectasis or nodular infiltrates. Mass lesions seem less likely, but follow-up should be performed. Electronically Signed   By:  Nelson Chimes M.D.   On: 11/22/2016 07:18   Dg Chest Port 1 View  Result Date: 12/09/2016 CLINICAL DATA:  Respiratory failure EXAM: PORTABLE CHEST 1 VIEW COMPARISON:  12/07/2016 FINDINGS: Cardiac shadow is stable. Tracheostomy tube and right-sided PICC line are again seen and stable. There  is been interval removal of the left-sided chest tube without recurrent pneumothorax. Bilateral pleural effusions are again seen and stable. No new focal infiltrate is seen. IMPRESSION: Stable bilateral pleural effusions. Interval chest tube removal on the left without recurrent pneumothorax. Tubes and lines as described. Electronically Signed   By: Inez Catalina M.D.   On: 12/09/2016 07:53   Dg Chest Port 1 View  Result Date: 12/07/2016 CLINICAL DATA:  Pneumothorax. EXAM: PORTABLE CHEST 1 VIEW COMPARISON:  Radiograph May 05, 2017. FINDINGS: Stable cardiomediastinal silhouette. Endotracheal tube is unchanged in position. Right-sided PICC line is again noted and unchanged. Left-sided chest tube is unchanged. No pneumothorax is noted. Stable probable small loculated pleural effusion along left lateral chest wall. Probable layering effusion seen in right lung. Stable left upper lobe opacity concerning for atelectasis or infiltrate. Bony thorax is unremarkable. IMPRESSION: Stable support apparatus. No pneumothorax is noted. Stable probable loculated left pleural effusion along left chest wall. Stable probable right pleural effusion. Stable left upper lobe opacity concerning for atelectasis or infiltrate. Electronically Signed   By: Marijo Conception, M.D.   On: 12/07/2016 07:42   Dg Chest Port 1 View  Result Date: 12/05/2016 CLINICAL DATA:  Followup exam for pleural effusion in possible pneumothorax. Left chest tube. EXAM: PORTABLE CHEST 1 VIEW COMPARISON:  11/23/2016 FINDINGS: Left chest tube is stable with its tip at the left apex. No pneumothorax. Airspace consolidation is noted on the left most apparent peripherally. There is  a small residual left pleural effusion. Hazy airspace opacity has developed throughout most of the right lung. Probable small right pleural effusion. Right PICC and tracheostomy tube are stable and well positioned. IMPRESSION: 1. Worsened lung aeration on the right with new hazy airspace opacity throughout most of the right lung. Probable small right pleural effusion. 2. Persistent airspace opacities in the left lung with a small residual pleural effusion. 3. Stable chest tube.  No pneumothorax. Electronically Signed   By: Lajean Manes M.D.   On: 12/05/2016 07:22   Dg Chest Port 1 View  Result Date: 11/23/2016 CLINICAL DATA:  Status post chest tube placement EXAM: PORTABLE CHEST 1 VIEW COMPARISON:  11/22/2016 FINDINGS: Recent chest tube placement has been performed. No pneumothorax is noted. Significant reduction in pleural fluid is seen. There is also significant improved aeration in the left lung. Persistent infiltrate is noted. Right PICC line and tracheostomy tube are again noted. IMPRESSION: Status post chest tube placement with significant re-expansion of the left lung and reduction in left pleural effusion. Electronically Signed   By: Inez Catalina M.D.   On: 11/23/2016 15:51   Dg Chest Portable 1 View  Result Date: 11/21/2016 CLINICAL DATA:  Pleural effusion.  Tracheostomy tube . EXAM: PORTABLE CHEST 1 VIEW COMPARISON:  11/20/2016. FINDINGS: Tracheostomy tube noted in stable position. Right PICC line noted in stable position. Complete opacification of the left hemithorax again noted. Shift of the mediastinum heart the right noted. These findings again suggest a left pleural effusion. Underlying left lung consolidation cannot be excluded. IMPRESSION: 1. Tracheostomy tube in stable position. 2. Unchanged chest with complete opacification of the left hemithorax. Findings consistent with left pleural effusion. Underlying left lung consolidation cannot be excluded . Electronically Signed   By: Marcello Moores   Register   On: 11/21/2016 10:24   Dg Chest Port 1 View  Result Date: 11/20/2016 CLINICAL DATA:  Pleural effusion versus mucous plugging. Left lung white out. EXAM: PORTABLE CHEST 1 VIEW COMPARISON:  11/19/2016 FINDINGS: Tracheostomy tube and  right PICC line remain in place, unchanged. Complete opacification of the left hemithorax again noted with shift of the mediastinal structures to the right suggesting at least component of pleural effusion. No confluent opacity on the right. IMPRESSION: No change since prior study. Electronically Signed   By: Rolm Baptise M.D.   On: 11/20/2016 08:08   Dg Chest Port 1 View  Result Date: 11/19/2016 CLINICAL DATA:  Collapse left lung EXAM: PORTABLE CHEST 1 VIEW COMPARISON:  11/18/2016 FINDINGS: Tracheostomy tube and right PICC line remain in place, unchanged. Complete opacification of the left hemithorax is stable. Shift of mediastinal structures to the right. Mild vascular congestion on the right. No visible effusion on the right. IMPRESSION: Complete opacification of the left hemithorax with mediastinal shift to the right. No change since prior study. Electronically Signed   By: Rolm Baptise M.D.   On: 11/19/2016 07:57   Dg Chest Port 1 View  Result Date: 11/18/2016 CLINICAL DATA:  Atelectasis. EXAM: PORTABLE CHEST 1 VIEW COMPARISON:  Radiograph of November 17, 2016. FINDINGS: Stable complete opacification of left hemithorax without significant mediastinal shift. Tracheostomy tube is unchanged in position. Right-sided PICC line is unchanged. No pneumothorax is noted. Bony thorax is unremarkable. Gastrostomy tube is seen in visualized portion of upper abdomen. IMPRESSION: Stable complete opacification of the left hemithorax without significant mediastinal shift. This is concerning for pleural effusion. Electronically Signed   By: Marijo Conception, M.D.   On: 11/18/2016 14:12   Dg Chest Port 1 View  Result Date: 11/17/2016 CLINICAL DATA:  Acute hypoxemic  respiratory failure portable chest x-ray of 11/16/2016 EXAM: PORTABLE CHEST 1 VIEW COMPARISON:  Chest x-ray of 11/16/2016, 08/10/2016, and 02/13/2016 FINDINGS: There is little change in complete opacification of the left hemithorax. No significant mediastinal shift is noted therefore some of the opacification may be due to atelectasis as well as fluid or mass effect. The right lung is grossly clear. Tracheostomy and right central venous line remain. IMPRESSION: 1. No change in complete opacification of the left hemithorax with no significant mediastinal shift. 2. Right lung is clear. 3. Tracheostomy and right central venous line are unchanged in position. Electronically Signed   By: Ivar Drape M.D.   On: 11/17/2016 08:06   Dg Chest Port 1 View  Result Date: 11/16/2016 CLINICAL DATA:  Acute hypoxia today EXAM: PORTABLE CHEST 1 VIEW COMPARISON:  Portable chest x-ray of November 15, 2016 FINDINGS: There is persistent complete opacification of the left hemithorax. There is mild shift of mediastinum toward the right. The lung markings throughout the right lung are more conspicuous today. The pulmonary vascularity is not clearly engorged. The tracheostomy appliance tip projects at the level of the clavicular heads. The PICC line tip projects over the proximal SVC. IMPRESSION: Complete opacification of the left hemithorax, stable. Mild interstitial prominence throughout the right lung which is new and may reflect interstitial edema or less likely pneumonia. Electronically Signed   By: David  Martinique M.D.   On: 11/16/2016 15:08   Dg Chest Port 1 View  Result Date: 11/15/2016 CLINICAL DATA:  Pneumonia EXAM: PORTABLE CHEST 1 VIEW COMPARISON:  11/05/2016 FINDINGS: Tracheostomy remains in good position. Complete opacification left hemithorax compatible with further collapse compared with the prior study. Heart is shifted mildly to the right suggesting underlying left pleural effusion. Right lung clear.  Right arm PICC  tip in the SVC. IMPRESSION: Further collapse of the left lung with complete opacification of the left chest. Mild shift of the mediastinal structures  to the right. Endobronchial obstruction due to mucous plugging or tumor possible. Underlying pneumonia, tumor, or empyema are possible. Electronically Signed   By: Franchot Gallo M.D.   On: 11/15/2016 08:31     CBC  Recent Labs Lab 12/06/16 0405 12/08/16 0458  WBC 13.6* 15.0*  HGB 8.4* 8.0*  HCT 26.9* 25.9*  PLT 371 367  MCV 91.5 91.5  MCH 28.6 28.3  MCHC 31.2 30.9  RDW 18.9* 19.3*    Chemistries   Recent Labs Lab 12/06/16 0405 12/08/16 0458  NA 140 140  K 3.4* 4.3  CL 100* 106  CO2 30 25  GLUCOSE 103* 80  BUN 12 13  CREATININE 0.64 0.74  CALCIUM 8.6* 8.5*  MG  --  1.6*  AST 20  --   ALT 13*  --   ALKPHOS 54  --   BILITOT 0.3  --    ------------------------------------------------------------------------------------------------------------------ No results for input(s): CHOL, HDL, LDLCALC, TRIG, CHOLHDL, LDLDIRECT in the last 72 hours.  Lab Results  Component Value Date   HGBA1C 5.3 05/20/2016   ------------------------------------------------------------------------------------------------------------------ No results for input(s): TSH, T4TOTAL, T3FREE, THYROIDAB in the last 72 hours.  Invalid input(s): FREET3 ------------------------------------------------------------------------------------------------------------------ No results for input(s): VITAMINB12, FOLATE, FERRITIN, TIBC, IRON, RETICCTPCT in the last 72 hours.  Coagulation profile No results for input(s): INR, PROTIME in the last 168 hours.  No results for input(s): DDIMER in the last 72 hours.  Cardiac Enzymes No results for input(s): CKMB, TROPONINI, MYOGLOBIN in the last 168 hours.  Invalid input(s): CK ------------------------------------------------------------------------------------------------------------------    Component Value  Date/Time   BNP 23.8 03/20/2016 1800     Daden Mahany M.D on 12/11/2016 at 4:10 PM  Between 7am to 7pm - Pager - [phone]  After 7pm go to [email or web address] - password [email or web address]  [facility] -  Office  [phone]  Dragon dictation system was used to create this note, attempts have been made to correct errors, however presence of uncorrected errors is not a reflection quality of care provided

## 2016-12-12 LAB — BASIC METABOLIC PANEL
Anion gap: 7 (ref 5–15)
BUN: 13 mg/dL (ref 6–20)
CALCIUM: 8.8 mg/dL — AB (ref 8.9–10.3)
CHLORIDE: 106 mmol/L (ref 101–111)
CO2: 30 mmol/L (ref 22–32)
CREATININE: 0.53 mg/dL — AB (ref 0.61–1.24)
GFR calc non Af Amer: >60 mL/min (ref >60)
Glucose, Bld: 105 mg/dL — ABNORMAL HIGH (ref 65–99)
Potassium: 3.1 mmol/L — ABNORMAL LOW (ref 3.5–5.1)
SODIUM: 143 mmol/L (ref 135–145)

## 2016-12-12 LAB — CBC
HCT: 24.1 % — ABNORMAL LOW (ref 39.0–52.0)
HEMOGLOBIN: 7.6 g/dL — AB (ref 13.0–17.0)
MCH: 28.9 pg (ref 26.0–34.0)
MCHC: 31.5 g/dL (ref 30.0–36.0)
MCV: 91.6 fL (ref 78.0–100.0)
Platelets: 349 10*3/uL (ref 150–400)
RBC: 2.63 MIL/uL — ABNORMAL LOW (ref 4.22–5.81)
RDW: 18.4 % — ABNORMAL HIGH (ref 11.5–15.5)
WBC: 10.1 10*3/uL (ref 4.0–10.5)

## 2016-12-12 LAB — GLUCOSE, CAPILLARY
GLUCOSE-CAPILLARY: 105 mg/dL — AB (ref 65–99)
GLUCOSE-CAPILLARY: 83 mg/dL (ref 65–99)
GLUCOSE-CAPILLARY: 93 mg/dL (ref 65–99)
GLUCOSE-CAPILLARY: 97 mg/dL (ref 65–99)
Glucose-Capillary: 93 mg/dL (ref 65–99)
Glucose-Capillary: 96 mg/dL (ref 65–99)

## 2016-12-12 MED ORDER — POTASSIUM CHLORIDE 20 MEQ PO PACK
40.0000 meq | PACK | Freq: Once | ORAL | Status: AC
  Administered 2016-12-12: 40 meq via ORAL
  Filled 2016-12-12: qty 2

## 2016-12-12 NOTE — Progress Notes (Signed)
Patient Demographics:    Linford Quintela, is a 51 y.o. male, DOB - Jun 09, 1966, BOF:[id number]  Admit date - 10/24/2016   Admitting Physician Rush Landmark, MD  Outpatient Primary MD for the patient is No PCP Per Patient  LOS - 20   Chief Complaint  Patient presents with  . Diarrhea        Subjective:    Sage Kopera today has no fevers, no emesis,  No chest pain,  Patient had episode of hypothermia,  resolved with warming blanket   Assessment  & Plan :    Active Problems:   Adrenal insufficiency (Addison's disease) (HCC)   Respiratory failure (HCC)   Renal failure   Pressure injury of skin   Acute respiratory failure with hypoxia (HCC)   ARDS (adult respiratory distress syndrome) (HCC)   Severe sepsis (HCC)   Chronic anoxic encephalopathy (HCC)   Systolic and diastolic CHF, chronic (HCC)   Mucus plugging of bronchi   Abdominal distension   Collapse of left lung   Encephalopathy chronic   Adrenal insufficiency (HCC)   Severe malnutrition (HCC)   Chest tube in place   Sepsis due to pneumonia ([place])   Severe protein-calorie malnutrition ([place])   Interval history: 51 y.o.BM with PMHxCerebral septic emboli ; Cerebral thrombosis with cerebral infarction (03/25/2016), Convulsions/seizures (05/04/2016); Dementia (04/27/2016); Depression; Dysphagia (05/04/2016); ETOH abuse;Tobacco abuse , Hydrocephalus Acute encephalopathy;Meningitis, Acute ischemic stroke,Cardiac arrest; Cardiogenic shock, NSTEMI, Chronic Diastolic and Systolic CHF  Admission in March 2017with strep pneumo meningitis c/b cardiac arrest and cardiogenic shock, ARDS requiring prolonged intubation and trach (now decannulated), and acute renal failure. He was admitted to [facility] 7/13/17through8/7/17 for recurrent hypoglycemia due to adrenal insufficiency, new subacute CVA, seizures with inability to protect airway s/p intubation 7/13  through 7/21. He re-presented8/08 again with sepsis from UTI, and was intubated due to AMS, and wastrached 8/18, and was discharged with a cuffless trach. That hospitalization was c/b MRSA bacteremia 8/24, and completed 3 weeks of IV vanc. He also developed C. Diff, and was treated with PO vanc through 9/11. He has had continued autonomic dysreflexia, which worsens during complications of acute illnesses.  Prior to this presentation, he had been well, although he was decannulated for reasons that are unclear. He was brought to the ED with decreased level of consciousness and nausea/vomiting/diarrhea. A CXR showed consolidation of the left upper lobe. He was intubated and transferred to [facility].  Plan:- 1)Acute hypoxic respiratory failure secondary to Enterobacter HCAP w/ ARDS and Sepsis- Status post tracheostomy for the third time - trach care per PCCM with no plans to decannulate - sepsis and acute infection appear to have cleared - tolerating trach collar, Continue to titrate down 02 but maintain SPO2 goal>93% , left-sided chest tube discontinued on 12/08/2016. 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 Dense right hemiplegia  4)Chronic Systolic and Diastolic CHF/Transient Afib--EF 40% Aug 2017 , - no evidence of volume overload at this time   5)HYpoKalemia-  loose stools probably secondary to PEG tube feedings, will replace and recheck  6)Social/Ethics- overall prognosis is poor, patient has no significant cognitive functions, remains  unresponsive. Wife is primary contact  Code Status:DNR   Disposition Plan:SNF Vs LTAC,,,, Apparently patient needs to be down to FiO2 of 28% prior to being able to go to nursing home, is currently requiring trach collar oxygen FiO2 40%, ??? If candidate for LTAC  Consults :Palliative care and ethics commitee  DVT Prophylaxis: SCDs  Lab Results  Component Value Date   PLT 349 12/12/2016    Inpatient Medications  Scheduled Meds: . amiodarone  200 mg Oral BID  . atorvastatin  20 mg Per Tube q1800  . budesonide (PULMICORT) nebulizer solution  0.5 mg Nebulization BID  . chlorhexidine gluconate (MEDLINE KIT)  15 mL Mouth Rinse BID  . clonazePAM  0.25 mg Per Tube Q6H  . famotidine  20 mg Per Tube BID  . fludrocortisone  0.1 mg Per Tube Daily  . folic acid  1 mg Per Tube Daily  . free water  300 mL Per Tube Q4H  . heparin  5,000 Units Subcutaneous Q8H  . hydrocortisone  10 mg Per Tube QHS  . hydrocortisone  5 mg Per Tube Daily  . ipratropium-albuterol  3 mL Nebulization TID  . levETIRAcetam  1,500 mg Per Tube BID  . mouth rinse  15 mL Mouth Rinse QID  . metoprolol tartrate  25 mg Per Tube BID  . QUEtiapine  50 mg Per Tube BID  . thiamine  100 mg Per Tube Daily  . Valproate Sodium  750 mg Per Tube Q8H   Continuous Infusions: . feeding supplement (VITAL AF 1.2 CAL) 1,000 mL (12/12/16 1317)   PRN Meds:.acetaminophen, fentaNYL (SUBLIMAZE) injection, levalbuterol, LORazepam, sodium chloride flush    Anti-infectives    Start     Dose/Rate Route Frequency Ordered Stop   11/25/16 1500  cefTRIAXone (ROCEPHIN) 1 g in dextrose 5 % 50 mL IVPB     1 g 100 mL/hr over 30 Minutes Intravenous Every 24 hours 11/25/16 1015 11/25/16 1724   11/18/16 1500  cefTRIAXone (ROCEPHIN) 1 g in dextrose 5 % 50 mL IVPB  Status:  Discontinued     1 g 100 mL/hr over 30 Minutes Intravenous Every 24 hours 11/18/16 1429 11/25/16 1015   11/11/16 2000  ciprofloxacin (CIPRO) tablet 750 mg      750 mg Per Tube 2 times daily 11/11/16 0957 11/11/16 2010   11/11/2016 1600  ciprofloxacin (CIPRO) tablet 750 mg  Status:  Discontinued     750 mg Oral 2 times daily 11/27/2016 1331 11/11/16 0957   11/02/16 1400  cefTRIAXone (ROCEPHIN) 1 g in dextrose 5 % 50 mL IVPB  Status:  Discontinued     1 g 100 mL/hr over 30 Minutes Intravenous Every 24 hours 11/02/16 1254 11/10/2016 1328   11/01/16 1000  vancomycin (VANCOCIN) IVPB 1000 mg/200 mL premix  Status:  Discontinued     1,000 mg 200 mL/hr over 60 Minutes Intravenous Every 12 hours 11/01/16 0818 11/03/16 1026   10/31/16 1600  vancomycin (VANCOCIN) IVPB 750 mg/150 ml premix  Status:  Discontinued     750 mg 150 mL/hr over 60 Minutes Intravenous Every 24 hours 10/08/2016 1624 11/01/16 0818   10/31/16 0000  piperacillin-tazobactam (  ZOSYN) IVPB 3.375 g  Status:  Discontinued     3.375 g 12.5 mL/hr over 240 Minutes Intravenous Every 8 hours 10/06/2016 1623 11/02/16 1254   10/26/2016 1600  piperacillin-tazobactam (ZOSYN) IVPB 3.375 g     3.375 g 100 mL/hr over 30 Minutes Intravenous  Once 11/01/2016 1553 10/25/2016 1637   10/21/2016 1600  vancomycin (VANCOCIN) IVPB 1000 mg/200 mL premix     1,000 mg 200 mL/hr over 60 Minutes Intravenous  Once 10/12/2016 1553 10/21/2016 1731        Objective:   Vitals:   12/12/16 1459 12/12/16 1500 12/12/16 1602 12/12/16 1700  BP:    118/80  Pulse:  86    Resp:  20  (!) 21  Temp:   99 F (37.2 C)   TempSrc:   Oral   SpO2: 96% 99%  96%  Weight:      Height:        Wt Readings from Last 3 Encounters:  12/12/16 68.2 kg (150 lb 5.7 oz)  08/10/16 64.7 kg (142 lb 10.2 oz)  07/11/16 60.3 kg (133 lb)     Intake/Output Summary (Last 24 hours) at 12/12/16 1755 Last data filed at 12/12/16 1700  Gross per 24 hour  Intake          3425.01 ml  Output             1400 ml  Net          2025.01 ml     Physical Exam  Gen:- Unresponsive HEENT:- [place].AT, No sclera icterus Neck- Trach collar,.  Lungs-  scattered rhonchi  anteriorly  CV- S1, S2 normal Abd-  +ve B.Sounds, Abd Soft, PEG tube site is clean dry and intact  Extremity/Skin:- Right-sided hemiplegia, skin was cool earlier    Data Review:   Micro Results No results found for this or any previous visit (from the past 240 hour(s)).  Radiology Reports Ct Chest Wo Contrast  Result Date: 11/22/2016 CLINICAL DATA:  Left chest opacification EXAM: CT CHEST WITHOUT CONTRAST TECHNIQUE: Multidetector CT imaging of the chest was performed following the standard protocol without IV contrast. COMPARISON:  Multiple chest radiographs most recently yesterday. FINDINGS: Cardiovascular: Heart size is normal. Very small amount of pericardial fluid. No aortic or coronary calcification identified. Mediastinum/Nodes: Mediastinal shift towards the right. No pathologically enlarged lymph nodes identified. Lungs/Pleura: Complete collapse of left lung. Large pleural effusion surrounding the left lung. Mass effect with mediastinal shift towards the right. Moderate-sized dependent effusion on the right with dependent pulmonary atelectasis. Nondependent lung is largely clear. Abnormal density in the right upper lobe suggests developing bronchopneumonia. Focal areas of irregular nodular shadows within the right lung are nonspecific and could represent foci of atelectasis or pneumonia. Neoplastic mass lesions seem unlikely given the generally clear previous chest radiography on the right. Upper Abdomen: Negative Musculoskeletal: Normal IMPRESSION: Very large left effusion. Complete collapse of left lung. Mass effect with left-to-right mediastinal shift. Moderate size right effusion layering dependently. Dependent pulmonary atelectasis. Possible developing pneumonia right upper lobe. Areas of patchy pulmonary density in the right lung could represent atelectasis or nodular infiltrates. Mass lesions seem less likely, but follow-up should be performed. Electronically Signed   By: Nelson Chimes  M.D.   On: 11/22/2016 07:18   Dg Chest Port 1 View  Result Date: 12/09/2016 CLINICAL DATA:  Respiratory failure EXAM: PORTABLE CHEST 1 VIEW COMPARISON:  12/07/2016 FINDINGS: Cardiac shadow is stable. Tracheostomy tube and right-sided PICC line are again seen and  stable. There is been interval removal of the left-sided chest tube without recurrent pneumothorax. Bilateral pleural effusions are again seen and stable. No new focal infiltrate is seen. IMPRESSION: Stable bilateral pleural effusions. Interval chest tube removal on the left without recurrent pneumothorax. Tubes and lines as described. Electronically Signed   By: Inez Catalina M.D.   On: 12/09/2016 07:53   Dg Chest Port 1 View  Result Date: 12/07/2016 CLINICAL DATA:  Pneumothorax. EXAM: PORTABLE CHEST 1 VIEW COMPARISON:  Radiograph May 05, 2017. FINDINGS: Stable cardiomediastinal silhouette. Endotracheal tube is unchanged in position. Right-sided PICC line is again noted and unchanged. Left-sided chest tube is unchanged. No pneumothorax is noted. Stable probable small loculated pleural effusion along left lateral chest wall. Probable layering effusion seen in right lung. Stable left upper lobe opacity concerning for atelectasis or infiltrate. Bony thorax is unremarkable. IMPRESSION: Stable support apparatus. No pneumothorax is noted. Stable probable loculated left pleural effusion along left chest wall. Stable probable right pleural effusion. Stable left upper lobe opacity concerning for atelectasis or infiltrate. Electronically Signed   By: Marijo Conception, M.D.   On: 12/07/2016 07:42   Dg Chest Port 1 View  Result Date: 12/05/2016 CLINICAL DATA:  Followup exam for pleural effusion in possible pneumothorax. Left chest tube. EXAM: PORTABLE CHEST 1 VIEW COMPARISON:  11/23/2016 FINDINGS: Left chest tube is stable with its tip at the left apex. No pneumothorax. Airspace consolidation is noted on the left most apparent peripherally. There is a small  residual left pleural effusion. Hazy airspace opacity has developed throughout most of the right lung. Probable small right pleural effusion. Right PICC and tracheostomy tube are stable and well positioned. IMPRESSION: 1. Worsened lung aeration on the right with new hazy airspace opacity throughout most of the right lung. Probable small right pleural effusion. 2. Persistent airspace opacities in the left lung with a small residual pleural effusion. 3. Stable chest tube.  No pneumothorax. Electronically Signed   By: Lajean Manes M.D.   On: 12/05/2016 07:22   Dg Chest Port 1 View  Result Date: 11/23/2016 CLINICAL DATA:  Status post chest tube placement EXAM: PORTABLE CHEST 1 VIEW COMPARISON:  11/22/2016 FINDINGS: Recent chest tube placement has been performed. No pneumothorax is noted. Significant reduction in pleural fluid is seen. There is also significant improved aeration in the left lung. Persistent infiltrate is noted. Right PICC line and tracheostomy tube are again noted. IMPRESSION: Status post chest tube placement with significant re-expansion of the left lung and reduction in left pleural effusion. Electronically Signed   By: Inez Catalina M.D.   On: 11/23/2016 15:51   Dg Chest Portable 1 View  Result Date: 11/21/2016 CLINICAL DATA:  Pleural effusion.  Tracheostomy tube . EXAM: PORTABLE CHEST 1 VIEW COMPARISON:  11/20/2016. FINDINGS: Tracheostomy tube noted in stable position. Right PICC line noted in stable position. Complete opacification of the left hemithorax again noted. Shift of the mediastinum heart the right noted. These findings again suggest a left pleural effusion. Underlying left lung consolidation cannot be excluded. IMPRESSION: 1. Tracheostomy tube in stable position. 2. Unchanged chest with complete opacification of the left hemithorax. Findings consistent with left pleural effusion. Underlying left lung consolidation cannot be excluded . Electronically Signed   By: Marcello Moores  Register    On: 11/21/2016 10:24   Dg Chest Port 1 View  Result Date: 11/20/2016 CLINICAL DATA:  Pleural effusion versus mucous plugging. Left lung white out. EXAM: PORTABLE CHEST 1 VIEW COMPARISON:  11/19/2016 FINDINGS: Tracheostomy  tube and right PICC line remain in place, unchanged. Complete opacification of the left hemithorax again noted with shift of the mediastinal structures to the right suggesting at least component of pleural effusion. No confluent opacity on the right. IMPRESSION: No change since prior study. Electronically Signed   By: Rolm Baptise M.D.   On: 11/20/2016 08:08   Dg Chest Port 1 View  Result Date: 11/19/2016 CLINICAL DATA:  Collapse left lung EXAM: PORTABLE CHEST 1 VIEW COMPARISON:  11/18/2016 FINDINGS: Tracheostomy tube and right PICC line remain in place, unchanged. Complete opacification of the left hemithorax is stable. Shift of mediastinal structures to the right. Mild vascular congestion on the right. No visible effusion on the right. IMPRESSION: Complete opacification of the left hemithorax with mediastinal shift to the right. No change since prior study. Electronically Signed   By: Rolm Baptise M.D.   On: 11/19/2016 07:57   Dg Chest Port 1 View  Result Date: 11/18/2016 CLINICAL DATA:  Atelectasis. EXAM: PORTABLE CHEST 1 VIEW COMPARISON:  Radiograph of November 17, 2016. FINDINGS: Stable complete opacification of left hemithorax without significant mediastinal shift. Tracheostomy tube is unchanged in position. Right-sided PICC line is unchanged. No pneumothorax is noted. Bony thorax is unremarkable. Gastrostomy tube is seen in visualized portion of upper abdomen. IMPRESSION: Stable complete opacification of the left hemithorax without significant mediastinal shift. This is concerning for pleural effusion. Electronically Signed   By: Marijo Conception, M.D.   On: 11/18/2016 14:12   Dg Chest Port 1 View  Result Date: 11/17/2016 CLINICAL DATA:  Acute hypoxemic respiratory  failure portable chest x-ray of 11/16/2016 EXAM: PORTABLE CHEST 1 VIEW COMPARISON:  Chest x-ray of 11/16/2016, 08/10/2016, and 02/13/2016 FINDINGS: There is little change in complete opacification of the left hemithorax. No significant mediastinal shift is noted therefore some of the opacification may be due to atelectasis as well as fluid or mass effect. The right lung is grossly clear. Tracheostomy and right central venous line remain. IMPRESSION: 1. No change in complete opacification of the left hemithorax with no significant mediastinal shift. 2. Right lung is clear. 3. Tracheostomy and right central venous line are unchanged in position. Electronically Signed   By: Ivar Drape M.D.   On: 11/17/2016 08:06   Dg Chest Port 1 View  Result Date: 11/16/2016 CLINICAL DATA:  Acute hypoxia today EXAM: PORTABLE CHEST 1 VIEW COMPARISON:  Portable chest x-ray of November 15, 2016 FINDINGS: There is persistent complete opacification of the left hemithorax. There is mild shift of mediastinum toward the right. The lung markings throughout the right lung are more conspicuous today. The pulmonary vascularity is not clearly engorged. The tracheostomy appliance tip projects at the level of the clavicular heads. The PICC line tip projects over the proximal SVC. IMPRESSION: Complete opacification of the left hemithorax, stable. Mild interstitial prominence throughout the right lung which is new and may reflect interstitial edema or less likely pneumonia. Electronically Signed   By: David  Martinique M.D.   On: 11/16/2016 15:08   Dg Chest Port 1 View  Result Date: 11/15/2016 CLINICAL DATA:  Pneumonia EXAM: PORTABLE CHEST 1 VIEW COMPARISON:  11/05/2016 FINDINGS: Tracheostomy remains in good position. Complete opacification left hemithorax compatible with further collapse compared with the prior study. Heart is shifted mildly to the right suggesting underlying left pleural effusion. Right lung clear.  Right arm PICC tip in the  SVC. IMPRESSION: Further collapse of the left lung with complete opacification of the left chest. Mild shift of the  mediastinal structures to the right. Endobronchial obstruction due to mucous plugging or tumor possible. Underlying pneumonia, tumor, or empyema are possible. Electronically Signed   By: Franchot Gallo M.D.   On: 11/15/2016 08:31     CBC  Recent Labs Lab 12/06/16 0405 12/08/16 0458 12/12/16 0935  WBC 13.6* 15.0* 10.1  HGB 8.4* 8.0* 7.6*  HCT 26.9* 25.9* 24.1*  PLT 371 367 349  MCV 91.5 91.5 91.6  MCH 28.6 28.3 28.9  MCHC 31.2 30.9 31.5  RDW 18.9* 19.3* 18.4*    Chemistries   Recent Labs Lab 12/06/16 0405 12/08/16 0458 12/12/16 0935  NA 140 140 143  K 3.4* 4.3 3.1*  CL 100* 106 106  CO2 '30 25 30  '$ GLUCOSE 103* 80 105*  BUN '12 13 13  '$ CREATININE 0.64 0.74 0.53*  CALCIUM 8.6* 8.5* 8.8*  MG  --  1.6*  --   AST 20  --   --   ALT 13*  --   --   ALKPHOS 54  --   --   BILITOT 0.3  --   --    ------------------------------------------------------------------------------------------------------------------ No results for input(s): CHOL, HDL, LDLCALC, TRIG, CHOLHDL, LDLDIRECT in the last 72 hours.  Lab Results  Component Value Date   HGBA1C 5.3 05/20/2016   ------------------------------------------------------------------------------------------------------------------ No results for input(s): TSH, T4TOTAL, T3FREE, THYROIDAB in the last 72 hours.  Invalid input(s): FREET3 ------------------------------------------------------------------------------------------------------------------ No results for input(s): VITAMINB12, FOLATE, FERRITIN, TIBC, IRON, RETICCTPCT in the last 72 hours.  Coagulation profile No results for input(s): INR, PROTIME in the last 168 hours.  No results for input(s): DDIMER in the last 72 hours.  Cardiac Enzymes No results for input(s): CKMB, TROPONINI, MYOGLOBIN in the last 168 hours.  Invalid input(s):  CK ------------------------------------------------------------------------------------------------------------------    Component Value Date/Time   BNP 23.8 03/20/2016 1800     Valor Turberville M.D on 12/12/2016 at 5:55 PM  Between 7am to 7pm - Pager - [phone]  After 7pm go to [email or web address] - password [email or web address]  [facility] -  Office  [phone]  Dragon dictation system was used to create this note, attempts have been made to correct errors, however presence of uncorrected errors is not a reflection quality of care provided

## 2016-12-12 NOTE — Care Management Note (Addendum)
Case Management Note  Patient Details  Name: Dale [name] MRN: [id number] Date of Birth: 09/22/1966  Subjective/Objective:  Received patient as a transfer from [address]. Pt initially presented for Respiratory Distress. Pt with a trach and Peg tube. #6 Trach @ 28%-cuffed. Palliative Care and the Ethics Committee is following the patient. Pt is now DNR. [facility]                     Action/Plan: CSW and CM continue to monitor disposition needs.   Expected Discharge Date:  11/11/16               Expected Discharge Plan:  Skilled Nursing Facility  In-House Referral:  Clinical Social Work  Discharge planning Services  CM Consult  Post Acute Care Choice:  NA Choice offered to:  NA  DME Arranged:  N/A DME Agency:  NA  HH Arranged:  NA HH Agency:  NA  Status of Service:  Completed, signed off  If discussed at [employer] Length of Stay Meetings, dates discussed:  12-13-16, 12-15-16   Additional Comments: Pt was previously planned for SNF today. Pt will not be d/c today- trach at 35% and goal will be 28% trach collar for SNF. Bair Hugger placed back on patient. CM will continue to monitor.   Gala LewandowskyGraves-Bigelow, Hersh Minney Kaye, RN 12/12/2016, 1:44 PM

## 2016-12-13 LAB — HEMOGLOBIN AND HEMATOCRIT, BLOOD
HCT: 25 % — ABNORMAL LOW (ref 39.0–52.0)
Hemoglobin: 7.7 g/dL — ABNORMAL LOW (ref 13.0–17.0)

## 2016-12-13 LAB — BASIC METABOLIC PANEL
ANION GAP: 9 (ref 5–15)
BUN: 12 mg/dL (ref 6–20)
CHLORIDE: 104 mmol/L (ref 101–111)
CO2: 30 mmol/L (ref 22–32)
Calcium: 8.9 mg/dL (ref 8.9–10.3)
Creatinine, Ser: 0.66 mg/dL (ref 0.61–1.24)
GFR calc non Af Amer: >60 mL/min (ref >60)
GLUCOSE: 96 mg/dL (ref 65–99)
Potassium: 3.5 mmol/L (ref 3.5–5.1)
Sodium: 143 mmol/L (ref 135–145)

## 2016-12-13 LAB — GLUCOSE, CAPILLARY
GLUCOSE-CAPILLARY: 90 mg/dL (ref 65–99)
GLUCOSE-CAPILLARY: 104 mg/dL — AB (ref 65–99)
GLUCOSE-CAPILLARY: 104 mg/dL — AB (ref 65–99)
Glucose-Capillary: 107 mg/dL — ABNORMAL HIGH (ref 65–99)
Glucose-Capillary: 109 mg/dL — ABNORMAL HIGH (ref 65–99)

## 2016-12-13 LAB — PREPARE RBC (CROSSMATCH)

## 2016-12-13 MED ORDER — SODIUM CHLORIDE 0.9% FLUSH
10.0000 mL | INTRAVENOUS | Status: DC | PRN
  Administered 2016-12-18: 10 mL via INTRAVENOUS
  Filled 2016-12-13: qty 20

## 2016-12-13 MED ORDER — SODIUM CHLORIDE 0.9 % IV SOLN
Freq: Once | INTRAVENOUS | Status: AC
  Administered 2016-12-14: via INTRAVENOUS

## 2016-12-13 MED ORDER — SODIUM CHLORIDE 0.9% FLUSH
10.0000 mL | Freq: Two times a day (BID) | INTRAVENOUS | Status: DC
  Administered 2016-12-13 – 2016-12-16 (×3): 10 mL via INTRAVENOUS
  Administered 2016-12-16 – 2016-12-17 (×2): 20 mL via INTRAVENOUS
  Administered 2016-12-17 – 2016-12-22 (×6): 10 mL via INTRAVENOUS

## 2016-12-13 MED ORDER — SODIUM CHLORIDE 0.9 % IV SOLN: Freq: Once | INTRAVENOUS | Status: AC

## 2016-12-13 NOTE — Progress Notes (Signed)
PROGRESS NOTE    JAYKO VOORHEES  WTG:[id number] DOB: Jun 08, 1966 DOA: 10/20/2016 PCP: No PCP Per Patient   Brief Narrative:  51 y.o. BM with PMHx Cerebral septic emboli ; Cerebral thrombosis with cerebral infarction (03/25/2016),  Convulsions/seizures  (05/04/2016); Dementia (04/27/2016); Depression; Dysphagia (05/04/2016); ETOH abuse;Tobacco abuse , Hydrocephalus Acute encephalopathy;Meningitis, Acute ischemic stroke, Cardiac arrest; Cardiogenic shock, NSTEMI,  Chronic Diastolic and Systolic CHF    Admission in March 2017 with strep pneumo meningitis c/b cardiac arrest and cardiogenic shock, ARDS requiring prolonged intubation and trach (now decannulated), and acute renal failure. He was admitted to [facility] 06/16/16 through 07/11/16 for recurrent hypoglycemia due to adrenal insufficiency, new subacute CVA, seizures with inability to protect airway s/p intubation 7/13 through 7/21. He re-presented 8/08 again with sepsis from UTI, and was intubated due to AMS, and was trached 8/18, and was discharged with a cuffless trach. That hospitalization was c/b MRSA bacteremia 8/24, and completed 3 weeks of IV vanc. He also developed C. Diff, and was treated with PO vanc through 9/11. He has had continued autonomic dysreflexia, which worsens during complications of acute illnesses.  Prior to this presentation, he had been well, although he was decannulated for reasons that are unclear. He was brought to the ED with decreased level of consciousness and nausea/vomiting/diarrhea. A CXR showed consolidation of the left upper lobe. He was intubated and transferred to [facility].  There have been significant amount of conversations with his wife with regard to goals of care, but she has insisted on full support. He is full code.   Subjective: 1/9  A/O x 0.  Mild w/d to pain eyes open    Assessment & Plan:   Active Problems:   Adrenal insufficiency (Addison's disease) (HCC)   Respiratory failure (HCC)   Renal failure  Pressure injury of skin   Acute respiratory failure with hypoxia (HCC)   ARDS (adult respiratory distress syndrome) (HCC)   Severe sepsis (HCC)   Chronic anoxic encephalopathy (HCC)   Systolic and diastolic CHF, chronic (HCC)   Mucus plugging of bronchi   Abdominal distension   Collapse of left lung   Encephalopathy chronic   Adrenal insufficiency (HCC)   Severe malnutrition (HCC)   Chest tube in place   Sepsis due to pneumonia (HCC)   Severe protein-calorie malnutrition (HCC)   Acute hypoxic respiratory failure secondary to Enterobacter HCAP w/ ARDS and Sepsis  Status post tracheostomy for the third time - trach care per PCCM with no plans to decannulate - sepsis and acute infection appear to have cleared - tolerating trach collar - Continue to titrate down 02 but maintain SPO2 goal> 93% -Left chest tube in place draining  mostly serous fluid. -1/4 Left  CHEST TUBE. Discontinued -1/9 #6 cuffless trach placed  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 -Mental status at baseline   Transient Atrial fibrillation -currently NSR   Chronic Systolic and Diastolic CHF -EF 92% Aug 2017  - no evidence of volume overload at this time  -Strict in and out since admission + 8.2 L  Filed Weights   12/11/16 0433 12/12/16 0500 12/13/16 0500  Weight: 68 kg (150 lb) 68.2 kg (150 lb 5.7 oz) 68.3 kg (150  lb 9.2 oz)  - weight is Relatively stable -Transfuse for hemoglobin<8 -1/9 transfuse 1 unit. PRBC  Adrenal insufficiency  - 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   Hypernatremia -Free water 300 ml QID    Hypokalemia -Potassium goal > 4   Hypomagnesemia -Magnesium goal > 2      Severe malnutrition in context of chronic illness -Continue tube feeds per PEG   Goals of care - PALLIATIVE CARE: Review chart shows that the care family on 12/15 and family appears not wanted to engage with palliative care until after the holidays.  -Patient now DO NOT RESUSCITATE -Goal is to titrate patient down to 28% on trach collar which will allow placement at SNF     DVT prophylaxis: Subcutaneous heparin Code Status: Full Family Communication: None Disposition Plan: Ready for SNF When trach collar at 28%   Consultants:  [facility]  Procedures/Significant Events:  11/26 Intubated (endotracheally) at OSH ED - transferred to [facility]  11/29 bronch - left inspissated pus   VENTILATOR SETTINGS: NA   Cultures   Antimicrobials: Anti-infectives    Start     Stop   11/25/16 1500  cefTRIAXone (ROCEPHIN) 1 g in dextrose 5 % 50 mL IVPB     11/25/16 1724   11/18/16 1500  cefTRIAXone (ROCEPHIN) 1 g in dextrose 5 % 50 mL IVPB  Status:  Discontinued     11/25/16 1015   11/11/16 2000  ciprofloxacin (CIPRO) tablet 750 mg     11/11/16 2010   11/11/2016 1600  ciprofloxacin (CIPRO) tablet 750 mg  Status:  Discontinued     11/11/16 0957   11/02/16 1400  cefTRIAXone (ROCEPHIN) 1 g in dextrose 5 % 50 mL IVPB  Status:  Discontinued     12/03/2016 1328   11/01/16 1000  vancomycin (VANCOCIN) IVPB 1000 mg/200 mL premix  Status:  Discontinued     11/03/16 1026   10/31/16 1600  vancomycin (VANCOCIN) IVPB 750 mg/150 ml premix  Status:  Discontinued     11/01/16 0818   10/31/16 0000  piperacillin-tazobactam (ZOSYN) IVPB 3.375 g  Status:  Discontinued     11/02/16 1254   10/17/2016 1600  piperacillin-tazobactam (ZOSYN) IVPB 3.375 g     10/19/2016 1637   11/02/2016 1600  vancomycin (VANCOCIN) IVPB 1000 mg/200 mL premix     10/21/2016 1731      Devices    LINES / TUBES:  PEG PICC 11/27  RUE >> Foley Trache 12/1 >> 1/9 #6 cuffless trach>>    Continuous Infusions: . feeding supplement  (VITAL AF 1.2 CAL) 1,000 mL (12/13/16 0803)     Objective: Vitals:   12/13/16 0500 12/13/16 0721 12/13/16 0726 12/13/16 0741  BP:   (!) 153/98 (!) 147/98  Pulse:   98 97  Resp:   (!) 23 (!) 28  Temp:    99.6 F (37.6 C)  TempSrc:    Rectal  SpO2:  93% 100% 97%  Weight: 68.3 kg (150 lb 9.2 oz)     Height:        Intake/Output Summary (Last 24 hours) at 12/13/16 0852 Last data filed at 12/13/16 0749  Gross per 24 hour  Intake             2995 ml  Output             2600 ml  Net              395 ml   Filed Weights   12/11/16  6195 12/12/16 0500 12/13/16 0500  Weight: 68 kg (150 lb) 68.2 kg (150 lb 5.7 oz) 68.3 kg (150 lb 9.2 oz)    Examination:  General: No acute respiratory distress  Lungs: Rhonchorous bilaterally, Decreased from 1/2 Cardiovascular: Renal rhythm and rate, negative murmurs rubs or gallops, w/o gallup or rub   Abdomen: Nondistended, soft, bowel sounds positive, PEG tube in place negative sign of infection Extremities: Stable trace edema B LE  .     Data Reviewed: Care during the described time interval was provided by me .  I have reviewed this patient's available data, including medical history, events of note, physical examination, and all test results as part of my evaluation. I have personally reviewed and interpreted all radiology studies.  CBC:  Recent Labs Lab 12/08/16 0458 12/12/16 0935  WBC 15.0* 10.1  HGB 8.0* 7.6*  HCT 25.9* 24.1*  MCV 91.5 91.6  PLT 367 093   Basic Metabolic Panel:  Recent Labs Lab 12/08/16 0458 12/12/16 0935 12/13/16 0635  NA 140 143 143  K 4.3 3.1* 3.5  CL 106 106 104  CO2 '25 30 30  '$ GLUCOSE 80 105* 96  BUN '13 13 12  '$ CREATININE 0.74 0.53* 0.66  CALCIUM 8.5* 8.8* 8.9  MG 1.6*  --   --    GFR: Estimated Creatinine Clearance: 104.3 mL/min (by C-G formula based on SCr of 0.66 mg/dL). Liver Function Tests: No results for input(s): AST, ALT, ALKPHOS, BILITOT, PROT, ALBUMIN in the last 168 hours. No results  for input(s): LIPASE, AMYLASE in the last 168 hours. No results for input(s): AMMONIA in the last 168 hours. Coagulation Profile: No results for input(s): INR, PROTIME in the last 168 hours. Cardiac Enzymes: No results for input(s): CKTOTAL, CKMB, CKMBINDEX, TROPONINI in the last 168 hours. BNP (last 3 results) No results for input(s): PROBNP in the last 8760 hours. HbA1C: No results for input(s): HGBA1C in the last 72 hours. CBG:  Recent Labs Lab 12/12/16 1632 12/12/16 2020 12/12/16 2341 12/13/16 0453 12/13/16 0739  GLUCAP 93 96 97 90 107*   Lipid Profile: No results for input(s): CHOL, HDL, LDLCALC, TRIG, CHOLHDL, LDLDIRECT in the last 72 hours. Thyroid Function Tests: No results for input(s): TSH, T4TOTAL, FREET4, T3FREE, THYROIDAB in the last 72 hours. Anemia Panel: No results for input(s): VITAMINB12, FOLATE, FERRITIN, TIBC, IRON, RETICCTPCT in the last 72 hours. Urine analysis:    Component Value Date/Time   COLORURINE YELLOW 10/19/2016 1807   APPEARANCEUR CLEAR 11/01/2016 1807   LABSPEC <1.005 (L) 10/12/2016 1807   PHURINE 7.0 10/29/2016 1807   GLUCOSEU NEGATIVE 11/03/2016 1807   HGBUR LARGE (A) 10/14/2016 1807   BILIRUBINUR NEGATIVE 11/03/2016 1807   KETONESUR NEGATIVE 10/29/2016 1807   PROTEINUR 30 (A) 10/19/2016 1807   UROBILINOGEN 1.0 11/13/2011 2010   NITRITE NEGATIVE 11/03/2016 1807   LEUKOCYTESUR NEGATIVE 10/29/2016 1807   Sepsis Labs: '@LABRCNTIP'$ (procalcitonin:4,lacticidven:4)  ) No results found for this or any previous visit (from the past 240 hour(s)).       Radiology Studies: No results found.      Scheduled Meds: . amiodarone  200 mg Oral BID  . atorvastatin  20 mg Per Tube q1800  . budesonide (PULMICORT) nebulizer solution  0.5 mg Nebulization BID  . chlorhexidine gluconate (MEDLINE KIT)  15 mL Mouth Rinse BID  . clonazePAM  0.25 mg Per Tube Q6H  . famotidine  20 mg Per Tube BID  . fludrocortisone  0.1 mg Per Tube Daily  . folic  acid  1 mg Per Tube Daily  . free water  300 mL Per Tube Q4H  . heparin  5,000 Units Subcutaneous Q8H  . hydrocortisone  10 mg Per Tube QHS  . hydrocortisone  5 mg Per Tube Daily  . ipratropium-albuterol  3 mL Nebulization TID  . levETIRAcetam  1,500 mg Per Tube BID  . mouth rinse  15 mL Mouth Rinse QID  . metoprolol tartrate  25 mg Per Tube BID  . QUEtiapine  50 mg Per Tube BID  . thiamine  100 mg Per Tube Daily  . Valproate Sodium  750 mg Per Tube Q8H   Continuous Infusions: . feeding supplement (VITAL AF 1.2 CAL) 1,000 mL (12/13/16 0803)     LOS: 44 days    Time spent: 40 minutes    WOODS, Geraldo Docker, MD [facility] Pager [phone]   If 7PM-7AM, please contact night-coverage [email or web address] Password [email or web address] 12/13/2016, 8:52 AM

## 2016-12-13 NOTE — Progress Notes (Signed)
PULMONARY / CRITICAL CARE MEDICINE   Name: Dale [name] MRN: [id number] DOB: 05/23/1966    ADMISSION DATE:  10/20/2016  CHIEF COMPLAINT:  Nausea and Vomiting  BRIEF SUMMARY: [age] yom w/ severe neurological deficits after meningitis.  He was brought to the ED with decreased level of consciousness and nausea/vomiting/diarrhea. A CXR showed consolidation of the left upper lobe. [facility] Tracheostomy on 12/1 for prolonged ventilation   SUBJECTIVE:  nsc.   VITAL SIGNS: BP (!) 147/98   Pulse 97   Temp 99.6 F (37.6 C) (Rectal)   Resp (!) 28   Ht 5\' 8"  (1.727 m)   Wt 150 lb 9.2 oz (68.3 kg)   SpO2 97%   BMI 22.89 kg/m    VENTILATOR SETTINGS: FiO2 (%):  [28 %] 28 %  INTAKE / OUTPUT: I/O last 3 completed shifts: In: 5235 [NG/GT:5235] Out: 2950 [Urine:2950]  PHYSICAL EXAMINATION: General: Middle aged male, chronically ill appearing, not interactive  Neuro:    Does not follow commands,NO TRACKS HEENT: Trach -> t collar Cardiovascular: RRR, no M/R/G.  Chest tube still in place, no output. Lungs: reduced in bases bilaterally Abdomen: BS x 4, soft, NT/ND.  Musculoskeletal: No gross deformities, no edema.  Skin: Intact, warm, no rashes.  LABS:  BMET  Recent Labs Lab 12/08/16 0458 12/12/16 0935 12/13/16 0635  NA 140 143 143  K 4.3 3.1* 3.5  CL 106 106 104  CO2 25 30 30   BUN 13 13 12   CREATININE 0.74 0.53* 0.66  GLUCOSE 80 105* 96   Electrolytes  Recent Labs Lab 12/08/16 0458 12/12/16 0935 12/13/16 0635  CALCIUM 8.5* 8.8* 8.9  MG 1.6*  --   --     CBC  Recent Labs Lab 12/08/16 0458 12/12/16 0935  WBC 15.0* 10.1  HGB 8.0* 7.6*  HCT 25.9* 24.1*  PLT 367 349    Coag's No results for input(s): APTT, INR in the last 168 hours.  Sepsis Markers  Recent Labs Lab 12/11/16 0054  LATICACIDVEN 0.9    ABG No results for input(s): PHART, PCO2ART, PO2ART in the last 168 hours.  Liver Enzymes No results for input(s):  AST, ALT, ALKPHOS, BILITOT, ALBUMIN in the last 168 hours.  Cardiac Enzymes No results for input(s): TROPONINI, PROBNP in the last 168 hours.  Glucose  Recent Labs Lab 12/12/16 1122 12/12/16 1632 12/12/16 2020 12/12/16 2341 12/13/16 0453 12/13/16 0739  GLUCAP 83 93 96 97 90 107*    Imaging No results found.   STUDIES:  CXR 11/26 >> LUL PNA EEG 12/5 >> negative for seizure   CULTURES: Blood 11/26 >> coag neg staph 1/2  C. Diff 11/26 >> (-) Resp 11/27 >>  Enterobacter cloacae BAL 12/14 >> proteus (RES cipro otherwise sens)   ANTIBIOTICS: Vanc 11/26 >>11/30 Pip-tazo 11/26 >>12/1 Cipro 12/1 >> 12/8 Ceftriaxone 12/15 > 12/22   SIGNIFICANT EVENTS: 11/26 - Intubated (endotracheally) at [facility] ED   11/29 - bronch with left inspissated pus 12/14 - proteus from BAL 12/18 - Worsening resp status, increased WOB.  S/p FOB 12/14, CXR 12/17 still c complete opacification L hemithorax.    LINES/TUBES: PEG PICC 11/27  RUE >> Foley Trache 12/1 >> CHEST TUBE >>1/5  DISCUSSION: 51 y/o man s/p devastating meningitis, chronic debility, p/w fever, AMS & severe L HCAP vs aspiration. This has been treated.  He is trach/PEG dependent. Mucous plugging has been most recent issue.This will likely be a chronic and recurrent issue. We did  obtain a BAL on 12/14 during his FOB which grew proteus and cxr on 12/20 showed improvement in LLL aeration     ASSESSMENT / PLAN:   Acute resp failure/ ARDS/ L HCAP with Enterobacter cloacae - s/p abx.  Overall slightly improved as tolerating ATC but overall prognosis for functional recovery seems poor. Left Pleural Effusion, massive s/p chest tube > improved.  Only 10cc output 01/03, none 01/04. Left lung atelectasis/collapse d/t mucous plugging > improved  Hx of trach x2. Trached for the 3rd time on 12/1   Continue ATC, wean FiO2 as able (on 50%). Continue goals of care discussions with family, hopefully they will support comfort measures. If no  response from family, consider ethics consult as agree in futility of care.   Rest of care per primary team.  Brett Canales Lysette Lindenbaum ACNP [facility] PCCM Pager [phone] till 3 pm If no answer page [phone] 12/13/2016, 10:03 AM

## 2016-12-13 NOTE — Progress Notes (Signed)
Responded to consult to support staff and patient as needed.  Per nurse patient may be returning to SNF when stable and family legalistic are finalized.  Chaplain availble as needed.  Venida JarvisWatlington, Drucilla Cumber, Chaplain,pager [phone]

## 2016-12-13 NOTE — Clinical Social Work Note (Signed)
[facility] can return once medically stable and trach has been uncuffed. CSW will continue to follow.  Roddie McBryant Mylinh Cragg MSW, [place]eria[place]CSW, [place], [phone]

## 2016-12-13 NOTE — Progress Notes (Addendum)
Trach changed from #6 cuffed to #6 cuffless Shiley per MD order. No complications/trauma noted. Assisted by S.Moran,RRT,RCP. ETCO2 detector with positive color change. BBS equal.

## 2016-12-14 LAB — GLUCOSE, CAPILLARY
GLUCOSE-CAPILLARY: 94 mg/dL (ref 65–99)
Glucose-Capillary: 91 mg/dL (ref 65–99)
Glucose-Capillary: 87 mg/dL (ref 65–99)
Glucose-Capillary: 85 mg/dL (ref 65–99)

## 2016-12-14 NOTE — Progress Notes (Signed)
RT cleaned IC at 1300.

## 2016-12-14 NOTE — Progress Notes (Signed)
Nutrition Follow-up  DOCUMENTATION CODES:   Severe malnutrition in context of chronic illness  INTERVENTION:    Continue Vital AF 1.2 via PEG at 65 ml/h to provide 1872 kcals, 117 gm protein, 1265 ml free water daily.  Continue free water flushes 300 ml every 4 hours.  NUTRITION DIAGNOSIS:   Malnutrition (Severe) related to chronic illness as evidenced by severe depletion of body fat, severe depletion of muscle mass  Ongoing  GOAL:   Patient will meet greater than or equal to 90% of their needs  Met  MONITOR:   TF tolerance, I & O's, Vent status, Labs  ASSESSMENT:   Pt with strep pneumo meningitis c/b cardiac arrest and cardiogenic shock, ARDS requiring prolonged intubation and trach (now decannulated), and acute renal failure.  He was admitted to [facility] 06/16/16 through 07/11/16 for recurrent hypoglycemia due to adrenal insufficiency, new subacute CVA, seizures with inability to protect airway s/p intubation 7/13 through 7/21.  He re-presented 8/08 again with sepsis from UTI, and was intubated due to AMS, and was trached 8/18, and was discharged with a cuffless trach. That hospitalization was c/b MRSA bacteremia 8/24, and completed 3 weeks of IV vanc. He also developed C. Diff, and was treated with PO vanc through 9/11. He has had continued autonomic dysreflexia, which worsens during complications of acute illnesses. Admitted with N/V/D.   S/P devastating meningitis, chronic debility and severe HCAP vs aspiration. Remains on trach collar. Receiving Vital AF 1.2 via PEG at 65 ml/h to provide 1872 kcals, 117 gm protein, 1265 ml free water daily.  Free water flushes 300 ml every 4 hours. Tolerating TF well. Weight stable.  Diet Order:  Diet NPO time specified  Skin:  Wound (see comment) (stage II to penis)  Last BM:  1/9  Height:   Ht Readings from Last 1 Encounters:  10/31/16 '5\' 8"'$  (1.727 m)    Weight:  Wt Readings from Last 1 Encounters:  12/13/16 150 lb 9.2 oz (68.3  kg)   01/02  152 lb 01/01  152 lb 12/31  147 lb 12/30  147 lb 12/29  153 lb 12/28  151 lb 12/27  153 lb 12/26  151 lb 12/25  152 lb 12/24  147 lb 12/23  148 lb 12/22  154 lb  Ideal Body Weight:  70 kg  BMI:  Body mass index is 22.89 kg/m.  Estimated Nutritional Needs:   Kcal:  1800-2000  Protein:  110-120 gm  Fluid:  1.8-2.0 L  EDUCATION NEEDS:   No education needs identified at this time  Molli Barrows, [place], [place], [place] Pager [phone] After Hours Pager [phone]

## 2016-12-14 NOTE — Progress Notes (Signed)
PROGRESS NOTE    Dale [name]  MRN:[id number] DOB: 10/19/1966 DOA: 10/22/2016 PCP: No PCP Per Patient   Brief Narrative:  50 y.o. BM with PMHx Cerebral septic emboli ; Cerebral thrombosis with cerebral infarction (03/25/2016),  Convulsions/seizures  (05/04/2016); Dementia (04/27/2016); Depression; Dysphagia (05/04/2016); ETOH abuse;Tobacco abuse , Hydrocephalus Acute encephalopathy;Meningitis, Acute ischemic stroke, Cardiac arrest; Cardiogenic shock, NSTEMI,  Chronic Diastolic and Systolic CHF.  Admission in March 2017 with strep pneumo meningitis c/b cardiac arrest and cardiogenic shock, ARDS requiring prolonged intubation and trach (now decannulated), and acute renal failure. He was admitted to [facility] 06/16/16 through 07/11/16 for recurrent hypoglycemia due to adrenal insufficiency, new subacute CVA, seizures with inability to protect airway s/p intubation 7/13 through 7/21. He re-presented 8/08 again with sepsis from UTI, and was intubated due to AMS, and was trached 8/18, and was discharged with a cuffless trach. That hospitalization was c/b MRSA bacteremia 8/24, and completed 3 weeks of IV vanc. He also developed C. Diff, and was treated with PO vanc through 9/11. He has had continued autonomic dysreflexia, which worsens during complications of acute illnesses.  Prior to this presentation, he had been well, although he was decannulated for reasons that are unclear. He was brought to the ED with decreased level of consciousness and nausea/vomiting/diarrhea. A CXR showed consolidation of the left upper lobe. He was intubated and transferred to [facility].  There have been significant amount of conversations with his wife with regard to goals of care, but she has insisted on full support. He is full code.   Subjective: Patient remains encephalopathic, which is his baseline.   Assessment & Plan:   Active Problems:   Tracheostomy status (HCC)   Adrenal insufficiency (Addison's disease) (HCC)  Respiratory failure (HCC)   Renal failure   Pressure injury of skin   Acute respiratory failure with hypoxia (HCC)   ARDS (adult respiratory distress syndrome) (HCC)   Severe sepsis (HCC)   Chronic anoxic encephalopathy (HCC)   Systolic and diastolic CHF, chronic (HCC)   Mucus plugging of bronchi   Abdominal distension   Collapse of left lung   Encephalopathy chronic   Adrenal insufficiency (HCC)   Severe malnutrition (HCC)   Chest tube in place   Sepsis due to pneumonia (HCC)   Severe protein-calorie malnutrition (HCC)   Acute hypoxic respiratory failure secondary to Enterobacter HCAP w/ ARDS and Sepsis  Status post tracheostomy for the third time - trach care per PCCM with no plans to decannulate - sepsis and acute infection appear to have cleared - tolerating trach collar - Continue to titrate down 02 but maintain SPO2 goal> 93% -Left chest tube discontinued on 1/4 -1/9 #6 cuffless trach placed. Discussed with pulmonology today. Eventually, he can go down to #4 cuff less. But he cannot be decannulated.  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 PRBC  Adrenal insufficiency  - Autonomic dysreflexia -Continuing   Florinef and Hydrocortisone   Hypernatremia -Free water 300 ml QID    Hypokalemia -Potassium goal > 4   Hypomagnesemia -Magnesium goal > 2   Severe malnutrition in context of  chronic illness -Continue tube feeds per PEG   Goals of care - PALLIATIVE CARE: Seen by palliative medicine  -Patient now DO NOT RESUSCITATE -Goal is to titrate patient down to 28% on trach collar which will allow placement at SNF  DVT prophylaxis: Subcutaneous heparin Code Status: DNR Family Communication: No family at bedside Disposition Plan: Ready for SNF When trach collar at 28%. Anticipate discharge tomorrow   Consultants:  PCCM  Procedures/Significant Events:  11/26 Intubated [facility]otracheally) at [facility] - transferred to [facility]  11/29 bronch - left inspissated pus   VENTILATOR SETTINGS: NA   Cultures   Antimicrobials: Anti-infectives    Start     Stop   11/25/16 1500  cefTRIAXone (ROCEPHIN) 1 g in dextrose 5 % 50 mL IVPB     11/25/16 1724   11/18/16 1500  cefTRIAXone (ROCEPHIN) 1 g in dextrose 5 % 50 mL IVPB  Status:  Discontinued     11/25/16 1015   11/11/16 2000  ciprofloxacin (CIPRO) tablet 750 mg     11/11/16 2010   11/22/2016 1600  ciprofloxacin (CIPRO) tablet 750 mg  Status:  Discontinued     11/11/16 0957   11/02/16 1400  cefTRIAXone (ROCEPHIN) 1 g in dextrose 5 % 50 mL IVPB  Status:  Discontinued     11/10/2016 1328   11/01/16 1000  vancomycin (VANCOCIN) IVPB 1000 mg/200 mL premix  Status:  Discontinued     11/03/16 1026   10/31/16 1600  vancomycin (VANCOCIN) IVPB 750 mg/150 ml premix  Status:  Discontinued     11/01/16 0818   10/31/16 0000  piperacillin-tazobactam (ZOSYN) IVPB 3.375 g  Status:  Discontinued     11/02/16 1254   10/21/2016 1600  piperacillin-tazobactam (ZOSYN) IVPB 3.375 g     10/07/2016 1637   10/29/2016 1600  vancomycin (VANCOCIN) IVPB 1000 mg/200 mL premix     11/03/2016 1731      Devices    LINES / TUBES:  PEG PICC 11/27  RUE >> Foley Trache 12/1 >> 1/9 #6 cuffless trach>>   Continuous Infusions: . feeding supplement (VITAL AF 1.2 CAL) 1,000 mL (12/13/16 0803)     Objective: Vitals:   12/14/16 0429 12/14/16 0439 12/14/16 0752  12/14/16 0811  BP: (!) 160/97   (!) 139/94  Pulse:    (!) 101  Resp:  (!) 32  (!) 30  Temp:    98.9 F (37.2 C)  TempSrc:    Rectal  SpO2:   98% 96%  Weight:      Height:        Intake/Output Summary (Last 24 hours) at 12/14/16 1113 Last data filed at 12/14/16 0930  Gross per 24 hour  Intake             2490 ml  Output             1750 ml  Net              740 ml   Filed Weights   12/11/16 0433 12/12/16 0500 12/13/16 0500  Weight: 68 kg (150 lb) 68.2 kg (150 lb 5.7 oz) 68.3 kg (150 lb 9.2 oz)    Examination:  General: No acute respiratory distress. Not responsive, which is baseline.  Lungs:  coarse breath sounds bilaterally. No wheezing, rales or  rhonchi. Cardiovascular: , S1, S2 is normal, regular. No S3, S4. No rubs, murmurs, or bruit. Abdomen: Nondistended, soft, bowel sounds positive, PEG tube in place negative sign of infection Extremities: Stable trace edema B LE      CBC:  Recent Labs Lab 12/08/16 0458 12/12/16 0935 12/13/16 0930  WBC 15.0* 10.1  --   HGB 8.0* 7.6* 7.7*  HCT 25.9* 24.1* 25.0*  MCV 91.5 91.6  --   PLT 367 349  --    Basic Metabolic Panel:  Recent Labs Lab 12/08/16 0458 12/12/16 0935 12/13/16 0635  NA 140 143 143  K 4.3 3.1* 3.5  CL 106 106 104  CO2 _0 GLUCOSE 80 105* 96  BUN _1 CREATININE 0.74 0.53* 0.66  CALCIUM 8.5* 8.8* 8.9  MG 1.6*  --   --    GFR: Estimated Creatinine Clearance: 104.3 mL/min (by C-G formula based on SCr of 0.66 mg/dL).  CBG:  Recent Labs Lab 12/13/16 0739 12/13/16 1130 12/13/16 1734 12/13/16 2023 12/14/16 0810  GLUCAP 107* 104* 104* 109* 91    No results found for this or any previous visit (from the past 240 hour(s)).    Radiology Studies: No results found.   Scheduled Meds: . amiodarone  200 mg Oral BID  . atorvastatin  20 mg Per Tube q1800  . budesonide (PULMICORT) nebulizer solution  0.5 mg Nebulization BID  . chlorhexidine gluconate (MEDLINE KIT)  15 mL Mouth Rinse  BID  . clonazePAM  0.25 mg Per Tube Q6H  . famotidine  20 mg Per Tube BID  . fludrocortisone  0.1 mg Per Tube Daily  . folic acid  1 mg Per Tube Daily  . free water  300 mL Per Tube Q4H  . heparin  5,000 Units Subcutaneous Q8H  . hydrocortisone  10 mg Per Tube QHS  . hydrocortisone  5 mg Per Tube Daily  . ipratropium-albuterol  3 mL Nebulization TID  . levETIRAcetam  1,500 mg Per Tube BID  . mouth rinse  15 mL Mouth Rinse QID  . metoprolol tartrate  25 mg Per Tube BID  . QUEtiapine  50 mg Per Tube BID  . sodium chloride flush  10-20 mL Intravenous Q12H  . thiamine  100 mg Per Tube Daily  . Valproate Sodium  750 mg Per Tube Q8H   Continuous Infusions: . feeding supplement (VITAL AF 1.2 CAL) 1,000 mL (12/13/16 0803)     LOS: 45 days    Time spent: 60 minutes    Ninetta Adelstein, MD [facility] Pager [phone]   If 7PM-7AM, please contact night-coverage [email or web address] Password [email or web address] 12/14/2016, 11:13 AM

## 2016-12-14 NOTE — Progress Notes (Signed)
RT added Sterile Water to ATC set up.

## 2016-12-15 ENCOUNTER — Inpatient Hospital Stay (HOSPITAL_COMMUNITY): Payer: Medicaid Other

## 2016-12-15 LAB — BASIC METABOLIC PANEL
Anion gap: 10 (ref 5–15)
BUN: 19 mg/dL (ref 6–20)
CALCIUM: 8.6 mg/dL — AB (ref 8.9–10.3)
CHLORIDE: 104 mmol/L (ref 101–111)
CO2: 29 mmol/L (ref 22–32)
CREATININE: 0.73 mg/dL (ref 0.61–1.24)
GFR calc non Af Amer: >60 mL/min (ref >60)
Glucose, Bld: 106 mg/dL — ABNORMAL HIGH (ref 65–99)
Potassium: 3.2 mmol/L — ABNORMAL LOW (ref 3.5–5.1)
Sodium: 143 mmol/L (ref 135–145)

## 2016-12-15 LAB — TYPE AND SCREEN
ABO/RH(D): B POS
ANTIBODY SCREEN: NEGATIVE
Unit division: 0

## 2016-12-15 LAB — CBC
HEMATOCRIT: 28.4 % — AB (ref 39.0–52.0)
HEMOGLOBIN: 8.9 g/dL — AB (ref 13.0–17.0)
MCH: 27.6 pg (ref 26.0–34.0)
MCHC: 31.3 g/dL (ref 30.0–36.0)
MCV: 87.9 fL (ref 78.0–100.0)
Platelets: 380 10*3/uL (ref 150–400)
RBC: 3.23 MIL/uL — ABNORMAL LOW (ref 4.22–5.81)
RDW: 18.6 % — AB (ref 11.5–15.5)
WBC: 11.9 10*3/uL — ABNORMAL HIGH (ref 4.0–10.5)

## 2016-12-15 LAB — GLUCOSE, CAPILLARY
GLUCOSE-CAPILLARY: 94 mg/dL (ref 65–99)
GLUCOSE-CAPILLARY: 82 mg/dL (ref 65–99)
Glucose-Capillary: 105 mg/dL — ABNORMAL HIGH (ref 65–99)
Glucose-Capillary: 74 mg/dL (ref 65–99)
Glucose-Capillary: 91 mg/dL (ref 65–99)
Glucose-Capillary: 93 mg/dL (ref 65–99)

## 2016-12-15 MED ORDER — MORPHINE SULFATE (PF) 2 MG/ML IV SOLN
2.0000 mg | INTRAVENOUS | Status: DC | PRN
  Administered 2016-12-15 – 2016-12-16 (×2): 2 mg via INTRAVENOUS
  Filled 2016-12-15 (×2): qty 1

## 2016-12-15 MED ORDER — CLONAZEPAM 0.5 MG PO TABS: 0.2500 mg | ORAL_TABLET | Freq: Four times a day (QID) | ORAL | 0 refills | Status: AC

## 2016-12-15 MED ORDER — IPRATROPIUM-ALBUTEROL 0.5-2.5 (3) MG/3ML IN SOLN: 3.0000 mL | Freq: Three times a day (TID) | RESPIRATORY_TRACT | Status: AC

## 2016-12-15 MED ORDER — HYDROCORTISONE 5 MG PO TABS: 5.0000 mg | ORAL_TABLET | Freq: Every day | ORAL | Status: AC

## 2016-12-15 MED ORDER — LORAZEPAM 1 MG PO TABS: 1.0000 mg | ORAL_TABLET | Freq: Four times a day (QID) | ORAL | 0 refills | Status: AC | PRN

## 2016-12-15 MED ORDER — HYDROCORTISONE 10 MG PO TABS: 10.0000 mg | ORAL_TABLET | Freq: Every day | ORAL | Status: AC

## 2016-12-15 MED ORDER — METOPROLOL TARTRATE 25 MG PO TABS: 25.0000 mg | ORAL_TABLET | Freq: Two times a day (BID) | ORAL | Status: AC

## 2016-12-15 MED ORDER — AMIODARONE HCL 200 MG PO TABS: 200.0000 mg | ORAL_TABLET | Freq: Two times a day (BID) | ORAL | Status: AC

## 2016-12-15 MED ORDER — QUETIAPINE FUMARATE 50 MG PO TABS: 50.0000 mg | ORAL_TABLET | Freq: Two times a day (BID) | ORAL | 0 refills | Status: AC

## 2016-12-15 NOTE — Progress Notes (Signed)
PROGRESS NOTE    Dale [name]  YHC:[id number] DOB: 25-Aug-1966 DOA: 10/12/2016 PCP: No PCP Per Patient   Brief Narrative:  51 y.o. BM with PMHx Cerebral septic emboli ; Cerebral thrombosis with cerebral infarction (03/25/2016),  Convulsions/seizures  (05/04/2016); Dementia (04/27/2016); Depression; Dysphagia (05/04/2016); ETOH abuse;Tobacco abuse , Hydrocephalus Acute encephalopathy;Meningitis, Acute ischemic stroke, Cardiac arrest; Cardiogenic shock, NSTEMI,  Chronic Diastolic and Systolic CHF.  Admission in March 2017 with strep pneumo meningitis c/b cardiac arrest and cardiogenic shock, ARDS requiring prolonged intubation and trach (now decannulated), and acute renal failure. He was admitted to [facility] 06/16/16 through 07/11/16 for recurrent hypoglycemia due to adrenal insufficiency, new subacute CVA, seizures with inability to protect airway s/p intubation 7/13 through 7/21. He re-presented 8/08 again with sepsis from UTI, and was intubated due to AMS, and was trached 8/18, and was discharged with a cuffless trach. That hospitalization was c/b MRSA bacteremia 8/24, and completed 3 weeks of IV vanc. He also developed C. Diff, and was treated with PO vanc through 9/11. He has had continued autonomic dysreflexia, which worsens during complications of acute illnesses.  Prior to this presentation, he had been well, although he was decannulated for reasons that are unclear. He was brought to the ED with decreased level of consciousness and nausea/vomiting/diarrhea. A CXR showed consolidation of the left upper lobe. He was intubated and transferred to [facility].  There have been significant amount of conversations with his wife with regard to goals of care. Subsequently made DO NOT RESUSCITATE.   Subjective: Patient remains encephalopathic, which is his baseline.   Assessment & Plan:   Active Problems:   Tracheostomy status (HCC)   Adrenal insufficiency (Addison's disease) (HCC)   Respiratory failure (HCC)   Renal failure   Pressure injury of skin   Acute respiratory failure with hypoxia (HCC)   ARDS (adult respiratory distress syndrome) (HCC)   Severe sepsis (HCC)   Chronic anoxic encephalopathy (HCC)   Systolic and diastolic CHF, chronic (HCC)   Mucus plugging of bronchi   Abdominal distension   Collapse of left lung   Encephalopathy chronic   Adrenal insufficiency (HCC)   Severe malnutrition (HCC)   Chest tube in place   Sepsis due to pneumonia ([place])   Severe protein-calorie malnutrition ([place])   Acute hypoxic respiratory failure secondary to Enterobacter HCAP w/ ARDS and Sepsis  Status post tracheostomy for the third time - trach care per PCCM with no plans to decannulate Patient noted to be more tachypneic this morning. His oxygen requirements have increased. Chest x-ray was done which shows worsening bilateral infiltrates. I believe patient is aspirating as he has done before. Discussed with Dr. Hilma Favors who has spoken extensively to patient's wife. No escalation of care. Continue to support with oxygen. Morphine as needed. If he continues to decline, then we'll need to notify family and perhaps go with full comfort care. No vent support. -Left chest tube discontinued on 1/4 -1/9 #6 cuffless trach placed. Discussed with pulmonology. Eventually, he can go down to #4 cuff less. But he cannot be decannulated.  Enterobacter cloacae left lung pneumonia w/ extensive infiltrate and atx of L lung -Has completed a full course of antibiotic therapy  -Coag neg Staph in 1 of 2 blood cx , Most c/w contaminant Patient with recurrent aspiration. This is will be an ongoing issue for him considering his overall medical condition. Any intervention that we provide at this time will only serve to briefly postpone the inevitable. This has been discussed with  Dr. Hilma Favors who is agreeable. No antibiotics at this time. Continue to monitor.  Acute on chronic Encephalopathy  - Hx of devastating meningitis  March 2017 -Mental status at baseline   Transient Atrial fibrillation -currently NSR   Chronic Systolic and Diastolic CHF -EF 93% Aug 7342  - no evidence of volume overload at this time  -Strict in and out since admission + 8.2 L - weight is Relatively stable -Transfuse for hemoglobin<8 -1/9 transfused 1 unit. PRBC  Adrenal insufficiency  - 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   Hypernatremia -Free water 300 ml QID. Sodium level is stable.    Hypokalemia Replete  Hypomagnesemia Recheck tomorrow  Severe malnutrition in context of chronic illness -Continue tube feeds per PEG   Goals of care - PALLIATIVE CARE: Seen by palliative medicine  -Patient now DO NOT RESUSCITATE No escalation of care. Prognosis is poor.  DVT prophylaxis: Subcutaneous heparin Code Status: DNR Family Communication: No family at bedside Disposition Plan: Patient with worsening pulmonary status this morning. Continue to monitor for now.   Consultants:  PCCM  Procedures/Significant Events:  11/26 Intubated ([facility]otracheally) at [facility] ED - transferred to [facility]  11/29 bronch - left inspissated pus   VENTILATOR SETTINGS: NA   Cultures   Antimicrobials: Anti-infectives    Start     Stop   11/25/16 1500  cefTRIAXone (ROCEPHIN) 1 g in dextrose 5 % 50 mL IVPB     11/25/16 1724   11/18/16 1500  cefTRIAXone (ROCEPHIN) 1 g in dextrose 5 % 50 mL IVPB  Status:  Discontinued     11/25/16 1015   11/11/16 2000  ciprofloxacin (CIPRO) tablet 750 mg     11/11/16 2010   11/12/2016 1600  ciprofloxacin (CIPRO) tablet 750 mg  Status:  Discontinued     11/11/16 0957   11/02/16 1400  cefTRIAXone (ROCEPHIN) 1 g in dextrose 5 % 50 mL IVPB  Status:  Discontinued     11/06/2016 1328   11/01/16 1000  vancomycin (VANCOCIN) IVPB 1000 mg/200 mL premix  Status:  Discontinued     11/03/16 1026   10/31/16 1600  vancomycin (VANCOCIN) IVPB 750 mg/150 ml premix  Status:  Discontinued      11/01/16 0818   10/31/16 0000  piperacillin-tazobactam (ZOSYN) IVPB 3.375 g  Status:  Discontinued     11/02/16 1254   10/09/2016 1600  piperacillin-tazobactam (ZOSYN) IVPB 3.375 g     10/05/2016 1637   10/18/2016 1600  vancomycin (VANCOCIN) IVPB 1000 mg/200 mL premix     10/22/2016 1731      Devices    LINES / TUBES:  PEG PICC 11/27  RUE >> Foley Trache 12/1 >> 1/9 #6 cuffless trach>>   Continuous Infusions: . feeding supplement (VITAL AF 1.2 CAL) 1,000 mL (12/13/16 0803)     Objective: Vitals:   12/15/16 0750 12/15/16 0806 12/15/16 1142 12/15/16 1217  BP:   130/88   Pulse: 80 75 93 91  Resp: (!) 23 (!) 22 (!) 24 (!) 25  Temp: 97.8 F (36.6 C)  99.7 F (37.6 C)   TempSrc: Oral  Oral   SpO2:  96% 91% 95%  Weight:      Height:        Intake/Output Summary (Last 24 hours) at 12/15/16 1451 Last data filed at 12/15/16 0800  Gross per 24 hour  Intake             1120 ml  Output  1750 ml  Net             -630 ml   Filed Weights   12/11/16 0433 12/12/16 0500 12/13/16 0500  Weight: 68 kg (150 lb) 68.2 kg (150 lb 5.7 oz) 68.3 kg (150 lb 9.2 oz)    Examination:  General: Not responsive, which is baseline. Seems more agitated. Lungs:  Tachypneic. Crackles bilaterally.  Cardiovascular: , S1, S2 is normal, regular. No S3, S4. No rubs, murmurs, or bruit. Abdomen: Nondistended, soft, bowel sounds positive, PEG tube in place negative sign of infection Extremities: Stable trace edema B LE      CBC:  Recent Labs Lab 12/12/16 0935 12/13/16 0930 12/15/16 0500  WBC 10.1  --  11.9*  HGB 7.6* 7.7* 8.9*  HCT 24.1* 25.0* 28.4*  MCV 91.6  --  87.9  PLT 349  --  601   Basic Metabolic Panel:  Recent Labs Lab 12/12/16 0935 12/13/16 0635 12/15/16 0500  NA 143 143 143  K 3.1* 3.5 3.2*  CL 106 104 104  CO2 '30 30 29  '$ GLUCOSE 105* 96 106*  BUN '13 12 19  '$ CREATININE 0.53* 0.66 0.73  CALCIUM 8.8* 8.9 8.6*   GFR: Estimated Creatinine Clearance: 104.3 mL/min  (by C-G formula based on SCr of 0.73 mg/dL).  CBG:  Recent Labs Lab 12/14/16 1947 12/15/16 0026 12/15/16 0353 12/15/16 0745 12/15/16 1141  GLUCAP 85 105* 94 93 74    No results found for this or any previous visit (from the past 240 hour(s)).    Radiology Studies: Dg Chest Port 1 View  Result Date: 12/15/2016 CLINICAL DATA:  Hypoxia. EXAM: PORTABLE CHEST 1 VIEW COMPARISON:  12/09/2016 . FINDINGS: Tracheostomy tube and right PICC line stable position. Cardiomegaly with normal pulmonary vascularity. Bilateral pulmonary infiltrates, progressed from prior exam. Bilateral pleural effusions. Similar findings noted on prior exam. IMPRESSION: 1. Tracheostomy tube and right PICC line stable position. 2. Bilateral pulmonary infiltrates and bilateral pleural effusions noted. Pulmonary infiltrates have progressed from prior exam. Electronically Signed   By: [place]   On: 12/15/2016 11:42     Scheduled Meds: . amiodarone  200 mg Oral BID  . atorvastatin  20 mg Per Tube q1800  . budesonide (PULMICORT) nebulizer solution  0.5 mg Nebulization BID  . chlorhexidine gluconate (MEDLINE KIT)  15 mL Mouth Rinse BID  . clonazePAM  0.25 mg Per Tube Q6H  . famotidine  20 mg Per Tube BID  . fludrocortisone  0.1 mg Per Tube Daily  . folic acid  1 mg Per Tube Daily  . free water  300 mL Per Tube Q4H  . heparin  5,000 Units Subcutaneous Q8H  . hydrocortisone  10 mg Per Tube QHS  . hydrocortisone  5 mg Per Tube Daily  . ipratropium-albuterol  3 mL Nebulization TID  . levETIRAcetam  1,500 mg Per Tube BID  . mouth rinse  15 mL Mouth Rinse QID  . metoprolol tartrate  25 mg Per Tube BID  . QUEtiapine  50 mg Per Tube BID  . sodium chloride flush  10-20 mL Intravenous Q12H  . thiamine  100 mg Per Tube Daily  . Valproate Sodium  750 mg Per Tube Q8H   Continuous Infusions: . feeding supplement (VITAL AF 1.2 CAL) 1,000 mL (12/13/16 0803)     LOS: 46 days    Time spent: 42  minutes    Saburo Luger, MD [facility] Pager [phone]   If 7PM-7AM, please contact night-coverage [email or web address] Password  [email or web address] 12/15/2016, 2:51 PM

## 2016-12-15 NOTE — Progress Notes (Signed)
Pt continue to desat, pt was suctioned, readjusted in bed  and placed on 40% oxygen. Pt however continue to desat, o2 was increased to 50%. Resp therapist was notified. Pt's o2 saturation is currently at 94%

## 2016-12-15 NOTE — Progress Notes (Signed)
Pt noted to be desaturating to mid to upper 70s. Pt is  on 28% o2 Trach collar, a lot of secretion from trach. Pt was suctioned but continued to desat. Pt o2 was increased to 35% o2 sat maintaining at 90% will continue to monitor patient.

## 2016-12-15 NOTE — Progress Notes (Addendum)
Palliative Care Follow-up  51 yo with anoxic brain injury, recurrent strokes, tracheostomy, chronically critically ill- no chance of making any meaningful recovery. He does not follow commands or interact with his environment.  I am waiting on his wife to get his children and other family together to discuss his care. I have strongly recommended Hospice Care and Comfort as our primary goal. His wife supports the medical teams decision making but does not want to unilaterally make decisions about her husbands care- she is afraid her family will "blame her for killing him". She wants to leave it up to "doctors and god".  When we last spoke I made him a DNR which she said she supported if that was the medical teams decision. I also advised against escalation of interventions ie. Would not transfuse, stop antibiotics for resistant infection or infection that is not responding to treatment, invasive procedures. She understands that he is fragile and could die soon despite our best efforts. She tells me she would be ok if that is what happened but she cannot and will not make that decision alone. She was supposed to get with the family and tell me a time when they could meet- I will try to encourage this again- she has her own serious medical problems ie. Liver cancer and is getting chemotherapy and has poorly controlled DM.  Palliative continues to try to help in this situation, I am happy to discuss and support not escalating care with the other medical providers on the team. [facility] I would definitely recommend hospice referral and no readmission.  Time: 25 min Greater than 50%  of this time was spent counseling and coordinating care related to the above assessment and plan.  Anderson MaltaElizabeth Golding, DO Palliative Medicine [phone]

## 2016-12-16 LAB — GLUCOSE, CAPILLARY
GLUCOSE-CAPILLARY: 103 mg/dL — AB (ref 65–99)
GLUCOSE-CAPILLARY: 106 mg/dL — AB (ref 65–99)
GLUCOSE-CAPILLARY: 79 mg/dL (ref 65–99)
GLUCOSE-CAPILLARY: 79 mg/dL (ref 65–99)
GLUCOSE-CAPILLARY: 94 mg/dL (ref 65–99)
GLUCOSE-CAPILLARY: 99 mg/dL (ref 65–99)
Glucose-Capillary: 92 mg/dL (ref 65–99)

## 2016-12-16 LAB — BASIC METABOLIC PANEL
Anion gap: 12 (ref 5–15)
BUN: 18 mg/dL (ref 6–20)
CALCIUM: 9 mg/dL (ref 8.9–10.3)
CO2: 29 mmol/L (ref 22–32)
CREATININE: 0.8 mg/dL (ref 0.61–1.24)
Chloride: 102 mmol/L (ref 101–111)
GFR calc Af Amer: >60 mL/min (ref >60)
GLUCOSE: 89 mg/dL (ref 65–99)
Potassium: 3.5 mmol/L (ref 3.5–5.1)
Sodium: 143 mmol/L (ref 135–145)

## 2016-12-16 LAB — MAGNESIUM: Magnesium: 2 mg/dL (ref 1.7–2.4)

## 2016-12-16 MED ORDER — HALOPERIDOL LACTATE 5 MG/ML IJ SOLN
0.5000 mg | INTRAMUSCULAR | Status: DC | PRN
  Administered 2016-12-17: 0.5 mg via INTRAVENOUS
  Filled 2016-12-16: qty 1

## 2016-12-16 MED ORDER — MORPHINE BOLUS VIA INFUSION
2.0000 mg | INTRAVENOUS | Status: DC | PRN
  Administered 2016-12-24: 2 mg via INTRAVENOUS
  Filled 2016-12-16: qty 2

## 2016-12-16 MED ORDER — CLONAZEPAM 0.5 MG PO TABS
0.5000 mg | ORAL_TABLET | Freq: Four times a day (QID) | ORAL | Status: DC
  Administered 2016-12-16 – 2016-12-23 (×24): 0.5 mg
  Filled 2016-12-16 (×24): qty 1

## 2016-12-16 MED ORDER — MORPHINE SULFATE (PF) 2 MG/ML IV SOLN
2.0000 mg | INTRAVENOUS | Status: DC | PRN
  Administered 2016-12-16: 2 mg via INTRAVENOUS
  Filled 2016-12-16: qty 1

## 2016-12-16 MED ORDER — GLYCOPYRROLATE 0.2 MG/ML IJ SOLN
0.2000 mg | INTRAMUSCULAR | Status: DC | PRN
  Administered 2016-12-23: 0.2 mg via INTRAVENOUS
  Filled 2016-12-16: qty 1

## 2016-12-16 MED ORDER — HALOPERIDOL LACTATE 2 MG/ML PO CONC
0.5000 mg | ORAL | Status: DC | PRN
  Filled 2016-12-16: qty 0.3

## 2016-12-16 MED ORDER — POLYVINYL ALCOHOL 1.4 % OP SOLN
1.0000 [drp] | Freq: Four times a day (QID) | OPHTHALMIC | Status: DC | PRN
  Filled 2016-12-16: qty 15

## 2016-12-16 MED ORDER — ONDANSETRON 4 MG PO TBDP
4.0000 mg | ORAL_TABLET | Freq: Four times a day (QID) | ORAL | Status: DC | PRN
  Filled 2016-12-16: qty 1

## 2016-12-16 MED ORDER — METOPROLOL TARTRATE 12.5 MG HALF TABLET: 12.5000 mg | ORAL_TABLET | Freq: Two times a day (BID) | ORAL | Status: DC

## 2016-12-16 MED ORDER — SODIUM CHLORIDE 0.9 % IV SOLN
1.0000 mg/h | INTRAVENOUS | Status: DC
  Administered 2016-12-22: 1 mg/h via INTRAVENOUS
  Filled 2016-12-16 (×2): qty 10

## 2016-12-16 MED ORDER — DIPHENHYDRAMINE HCL 50 MG/ML IJ SOLN: 12.5000 mg | INTRAMUSCULAR | Status: DC | PRN

## 2016-12-16 MED ORDER — HALOPERIDOL 1 MG PO TABS
0.5000 mg | ORAL_TABLET | ORAL | Status: DC | PRN
  Administered 2016-12-17: 0.5 mg via ORAL
  Filled 2016-12-16 (×2): qty 1

## 2016-12-16 MED ORDER — ONDANSETRON HCL 4 MG/2ML IJ SOLN: 4.0000 mg | Freq: Four times a day (QID) | INTRAMUSCULAR | Status: DC | PRN

## 2016-12-16 MED ORDER — GLYCOPYRROLATE 1 MG PO TABS
1.0000 mg | ORAL_TABLET | ORAL | Status: DC | PRN
  Administered 2016-12-18 – 2016-12-19 (×2): 1 mg via ORAL
  Filled 2016-12-16 (×3): qty 1

## 2016-12-16 MED ORDER — GLYCOPYRROLATE 0.2 MG/ML IJ SOLN: 0.2000 mg | INTRAMUSCULAR | Status: DC | PRN

## 2016-12-16 NOTE — Progress Notes (Signed)
PROGRESS NOTE    Dale [name]  SNI:[id number] DOB: 1966-08-01 DOA: 10/07/2016 PCP: No PCP Per Patient   Brief Narrative:  51 y.o. BM with PMHx Cerebral septic emboli ; Cerebral thrombosis with cerebral infarction (03/25/2016),  Convulsions/seizures  (05/04/2016); Dementia (04/27/2016); Depression; Dysphagia (05/04/2016); ETOH abuse;Tobacco abuse , Hydrocephalus Acute encephalopathy;Meningitis, Acute ischemic stroke, Cardiac arrest; Cardiogenic shock, NSTEMI,  Chronic Diastolic and Systolic CHF.  Admission in March 2017 with strep pneumo meningitis c/b cardiac arrest and cardiogenic shock, ARDS requiring prolonged intubation and trach (now decannulated), and acute renal failure. He was admitted to [facility] 06/16/16 through 07/11/16 for recurrent hypoglycemia due to adrenal insufficiency, new subacute CVA, seizures with inability to protect airway s/p intubation 7/13 through 7/21. He re-presented 8/08 again with sepsis from UTI, and was intubated due to AMS, and was trached 8/18, and was discharged with a cuffless trach. That hospitalization was c/b MRSA bacteremia 8/24, and completed 3 weeks of IV vanc. He also developed C. Diff, and was treated with PO vanc through 9/11. He has had continued autonomic dysreflexia, which worsens during complications of acute illnesses.  Prior to this presentation, he had been well, although he was decannulated for reasons that are unclear. He was brought to the ED with decreased level of consciousness and nausea/vomiting/diarrhea. A CXR showed consolidation of the left upper lobe. He was intubated and transferred to [facility].  There have been significant amount of conversations with his wife with regard to goals of care. Subsequently made DO NOT RESUSCITATE.   Subjective: Patient remains encephalopathic, which is his baseline.   Assessment & Plan:   Active Problems:   Tracheostomy status (HCC)   Adrenal insufficiency (Addison's disease) (HCC)   Respiratory failure (HCC)   Renal failure   Pressure injury of skin   Acute respiratory failure with hypoxia (HCC)   ARDS (adult respiratory distress syndrome) (HCC)   Severe sepsis (HCC)   Chronic anoxic encephalopathy (HCC)   Systolic and diastolic CHF, chronic (HCC)   Mucus plugging of bronchi   Abdominal distension   Collapse of left lung   Encephalopathy chronic   Adrenal insufficiency (HCC)   Severe malnutrition (HCC)   Chest tube in place   Sepsis due to pneumonia (HCC)   Severe protein-calorie malnutrition (HCC)   Acute hypoxic respiratory failure secondary to Enterobacter HCAP w/ ARDS and Sepsis  Status post tracheostomy for the third time - trach care per PCCM with no plans to decannulate Patient noted to be more tachypneic this morning. His oxygen requirements have increased. Chest x-ray was done which shows worsening bilateral infiltrates. I believe patient is aspirating as he has done before. Discussed with Dr. Phillips Odor who has spoken extensively to patient's wife. No escalation of care. Continue to support with oxygen. Morphine as needed. If he continues to decline, then we'll need to notify family and perhaps go with full comfort care. No vent support. Seems to be stable this morning. Is on higher FiO2. -Left chest tube discontinued on 1/4 -1/9 #6 cuffless trach placed. Discussed with pulmonology. Eventually, he can go down to #4 cuff less. But he cannot be decannulated.  Enterobacter cloacae left lung pneumonia w/ extensive infiltrate and atx of L lung -Has completed a full course of antibiotic therapy  -Coag neg Staph in 1 of 2 blood cx , Most c/w contaminant Patient with recurrent aspiration. This is will be an ongoing issue for him considering his overall medical condition. Any intervention that we provide at this time will only serve  to briefly postpone the inevitable. This has been discussed with Dr. Hilma Favors who is agreeable. No antibiotics at this time. Continue to monitor.  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 Continue current medications. Blood pressure noted to drop significantly after he is given his cardiac medications. Will adjust the dose of medications.  Chronic Systolic and Diastolic CHF -EF 56% Aug 3875  - no evidence of volume overload at this time  -Strict in and out since admission + 8.2 L - weight is Relatively stable -Transfuse for hemoglobin<8 -1/9 transfused 1 unit. PRBC  Adrenal insufficiency  - 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   Hypernatremia -Free water 300 ml QID. Sodium level is stable.    Hypokalemia Replete  Hypomagnesemia Recheck tomorrow  Severe malnutrition in context of chronic illness -Continue tube feeds per PEG   Goals of care - PALLIATIVE CARE: Seen by palliative medicine  -Patient now DO NOT RESUSCITATE No escalation of care. Prognosis is poor.  DVT prophylaxis: Subcutaneous heparin Code Status: DNR Family Communication: No family at bedside Disposition Plan: Patient remains stable. Still requiring high FiO2.    Consultants:  PCCM  Procedures/Significant Events:  11/26 [facility]ub[facility]otracheally) at [facility] - [facility] to [facility]  11/29 bronch - left inspissated pus   VENTILATOR SETTINGS: NA   Cultures   Antimicrobials: Anti-infectives    Start     Stop   11/25/16 1500  cefTRIAXone (ROCEPHIN) 1 g in dextrose 5 % 50 mL IVPB     11/25/16 1724   11/18/16 1500  cefTRIAXone (ROCEPHIN) 1 g in dextrose 5 % 50 mL IVPB  Status:  Discontinued     11/25/16 1015   11/11/16 2000  ciprofloxacin (CIPRO) tablet 750 mg     11/11/16 2010   11/20/2016 1600  ciprofloxacin (CIPRO) tablet 750 mg  Status:  Discontinued     11/11/16 0957   11/02/16 1400  cefTRIAXone (ROCEPHIN) 1 g in dextrose 5 % 50 mL IVPB  Status:  Discontinued     12/01/2016 1328   11/01/16 1000  vancomycin  (VANCOCIN) IVPB 1000 mg/200 mL premix  Status:  Discontinued     11/03/16 1026   10/31/16 1600  vancomycin (VANCOCIN) IVPB 750 mg/150 ml premix  Status:  Discontinued     11/01/16 0818   10/31/16 0000  piperacillin-tazobactam (ZOSYN) IVPB 3.375 g  Status:  Discontinued     11/02/16 1254   10/27/2016 1600  piperacillin-tazobactam (ZOSYN) IVPB 3.375 g     10/25/2016 1637   10/24/2016 1600  vancomycin (VANCOCIN) IVPB 1000 mg/200 mL premix     10/18/2016 1731      Devices    LINES / TUBES:  PEG PICC 11/27  RUE >> Foley Trache 12/1 >> 1/9 #6 cuffless trach>>   Continuous Infusions: . feeding supplement (VITAL AF 1.2 CAL) 1,000 mL (12/13/16 0803)     Objective: Vitals:   12/16/16 0757 12/16/16 0939 12/16/16 0943 12/16/16 0944  BP: (!) 145/99     Pulse: 90   92  Resp: (!) 29   19  Temp: 97.4 F (36.3 C)     TempSrc: Axillary     SpO2: 92% 97% 98% 97%  Weight:      Height:        Intake/Output Summary (Last 24 hours) at 12/16/16 1006 Last data filed at 12/16/16 0800  Gross per 24 hour  Intake  2995 ml  Output             2075 ml  Net              920 ml   Filed Weights   12/12/16 0500 12/13/16 0500 12/16/16 0346  Weight: 68.2 kg (150 lb 5.7 oz) 68.3 kg (150 lb 9.2 oz) 60.8 kg (134 lb)    Examination:  General: Not responsive, which is baseline. Less agitated compared to yesterday Lungs: As tachypneic as yesterday.  Crackles bilaterally.  Cardiovascular: , S1, S2 is normal, regular. No S3, S4. No rubs, murmurs, or bruit. Abdomen: Nondistended, soft, bowel sounds positive, PEG tube in place without sign of infection Extremities: Stable trace edema B LE      CBC:  Recent Labs Lab 12/12/16 0935 12/13/16 0930 12/15/16 0500  WBC 10.1  --  11.9*  HGB 7.6* 7.7* 8.9*  HCT 24.1* 25.0* 28.4*  MCV 91.6  --  87.9  PLT 349  --  160   Basic Metabolic Panel:  Recent Labs Lab 12/12/16 0935 12/13/16 0635 12/15/16 0500 12/16/16 0535  NA 143 143 143 143    K 3.1* 3.5 3.2* 3.5  CL 106 104 104 102  CO2 '30 30 29 29  '$ GLUCOSE 105* 96 106* 89  BUN '13 12 19 18  '$ CREATININE 0.53* 0.66 0.73 0.80  CALCIUM 8.8* 8.9 8.6* 9.0  MG  --   --   --  2.0   GFR: Estimated Creatinine Clearance: 92.9 mL/min (by C-G formula based on SCr of 0.8 mg/dL).  CBG:  Recent Labs Lab 12/15/16 1716 12/15/16 2018 12/16/16 0026 12/16/16 0344 12/16/16 0755  GLUCAP 82 91 103* 79 94    No results found for this or any previous visit (from the past 240 hour(s)).    Radiology Studies: Dg Chest Port 1 View  Result Date: 12/15/2016 CLINICAL DATA:  Hypoxia. EXAM: PORTABLE CHEST 1 VIEW COMPARISON:  12/09/2016 . FINDINGS: Tracheostomy tube and right PICC line stable position. Cardiomegaly with normal pulmonary vascularity. Bilateral pulmonary infiltrates, progressed from prior exam. Bilateral pleural effusions. Similar findings noted on prior exam. IMPRESSION: 1. Tracheostomy tube and right PICC line stable position. 2. Bilateral pulmonary infiltrates and bilateral pleural effusions noted. Pulmonary infiltrates have progressed from prior exam. Electronically Signed   By: [place]   On: 12/15/2016 11:42     Scheduled Meds: . amiodarone  200 mg Oral BID  . atorvastatin  20 mg Per Tube q1800  . budesonide (PULMICORT) nebulizer solution  0.5 mg Nebulization BID  . chlorhexidine gluconate (MEDLINE KIT)  15 mL Mouth Rinse BID  . clonazePAM  0.25 mg Per Tube Q6H  . famotidine  20 mg Per Tube BID  . fludrocortisone  0.1 mg Per Tube Daily  . folic acid  1 mg Per Tube Daily  . free water  300 mL Per Tube Q4H  . heparin  5,000 Units Subcutaneous Q8H  . hydrocortisone  10 mg Per Tube QHS  . hydrocortisone  5 mg Per Tube Daily  . ipratropium-albuterol  3 mL Nebulization TID  . levETIRAcetam  1,500 mg Per Tube BID  . mouth rinse  15 mL Mouth Rinse QID  . metoprolol tartrate  25 mg Per Tube BID  . QUEtiapine  50 mg Per Tube BID  . sodium chloride flush  10-20 mL  Intravenous Q12H  . thiamine  100 mg Per Tube Daily  . Valproate Sodium  750 mg Per Tube Q8H  Continuous Infusions: . feeding supplement (VITAL AF 1.2 CAL) 1,000 mL (12/13/16 0803)     LOS: 47 days    Time spent: 2 minutes    Haron Beilke, MD [facility] Pager [phone]   If 7PM-7AM, please contact night-coverage [email or web address] Password [email or web address] 12/16/2016, 10:06 AM

## 2016-12-16 NOTE — Plan of Care (Signed)
Problem: Physical Regulation: Goal: Ability to maintain clinical measurements within normal limits will improve Outcome: Progressing Continue to help patient with comfort measures;  Holding morphine drip from previous shift as long as current anxiety medications working at this time patient appears comfortable with no teach back displayed.

## 2016-12-16 NOTE — Progress Notes (Addendum)
Palliative Care Follow-up  I spoke with Mrs. Mickeal SkinnerShirley Harkey today at length about her husbands condition and his decline. She brought their daughter Karel Jarvisbony up to see him this week and she understands now how sick he is and that he is suffering. Their son will not come see him and is struggling with the entire situation. She tells me he will not talk about his condition or death. Dale [name] is realistic she knows that we are in comfort care mode now and that we have done all we can do- she tells me she cannot let him suffer anymore and is expecting his death but will continue to pray for a miracle. She again is accepting of medical consensus on comfort measures and not escalating his care. She told me just to let her know if he passes and she will call the family. His arrangements have already been made.  My recommendation is that we transition to full comfort care-I would continue seizure medications and anxiety agitation medications and discontinue all other meds. Continue to provide trach collar and supplemental O2, would reduce feeding to trickle for comfort-as his body begins to shut down continuing to feed can cause discomfort.  I anticipate a hospital death and so does his wife Dale [name]. This family expects a hospital death which they associate with the best possible care-they will not accept hospice care.  Will continue to follow for terminal care.  Anderson MaltaElizabeth Golding, DO Palliative Medicine [phone]  Time: 35 min Greater than 50%  of this time was spent counseling and coordinating care related to the above assessment and plan.

## 2016-12-16 NOTE — Progress Notes (Signed)
Patient not seen 12/16/2016 Per Dr Barnie DelG KRishnan of [facility] - comfort mode now and pccm can sign off  Dr. Kalman ShanMurali Khyler Eschmann, M.D., [facility] Pulmonary and Critical Care Medicine Staff Physician [facility] [place] [facility] Pager: [phone], If no answer or between  15:00h - 7:00h: call [phone]  12/16/2016 12:16 PM

## 2016-12-16 NOTE — Progress Notes (Signed)
Pt's BP dropped to 75//54 post morning medication pass which included amiodarone, seroquel, and metoprolol. MD Osvaldo ShipperKrishnan Gokul notified and he said to recheck BP over the course of the next hour to check trend and notify if it doesn't go up. Will continue to monitor.

## 2016-12-16 NOTE — Progress Notes (Signed)
Comfort measures initiated. Pt appears very calm and comfortable at this time and is not in any respiratory distress. I notified palliative MD Linna Darnernwar at [phone]  who said it was ok to push back the start time of the morphine drip till later. Time moved see MAR. Will continue to monitor.

## 2016-12-17 LAB — GLUCOSE, CAPILLARY
GLUCOSE-CAPILLARY: 77 mg/dL (ref 65–99)
GLUCOSE-CAPILLARY: 97 mg/dL (ref 65–99)
Glucose-Capillary: 100 mg/dL — ABNORMAL HIGH (ref 65–99)

## 2016-12-17 NOTE — Progress Notes (Signed)
PROGRESS NOTE    Dale [name]  RLY:[id number] DOB: 1966-08-02 DOA: 10/07/2016 PCP: No PCP Per Patient   Brief Narrative:  51 y.o. BM with PMHx Cerebral septic emboli ; Cerebral thrombosis with cerebral infarction (03/25/2016),  Convulsions/seizures  (05/04/2016); Dementia (04/27/2016); Depression; Dysphagia (05/04/2016); ETOH abuse;Tobacco abuse , Hydrocephalus Acute encephalopathy;Meningitis, Acute ischemic stroke, Cardiac arrest; Cardiogenic shock, NSTEMI,  Chronic Diastolic and Systolic CHF.  Admission in March 2017 with strep pneumo meningitis c/b cardiac arrest and cardiogenic shock, ARDS requiring prolonged intubation and trach (now decannulated), and acute renal failure. He was admitted to [facility] 06/16/16 through 07/11/16 for recurrent hypoglycemia due to adrenal insufficiency, new subacute CVA, seizures with inability to protect airway s/p intubation 7/13 through 7/21. He re-presented 8/08 again with sepsis from UTI, and was intubated due to AMS, and was trached 8/18, and was discharged with a cuffless trach. That hospitalization was c/b MRSA bacteremia 8/24, and completed 3 weeks of IV vanc. He also developed C. Diff, and was treated with PO vanc through 9/11. He has had continued autonomic dysreflexia, which worsens during complications of acute illnesses.  Prior to this presentation, he had been well, although he was decannulated for reasons that are unclear. He was brought to the ED with decreased level of consciousness and nausea/vomiting/diarrhea. A CXR showed consolidation of the left upper lobe. He was intubated and transferred to [facility].  There have been significant amount of conversations with his wife with regard to goals of care. Subsequently made DO NOT RESUSCITATE.   Subjective: Patient remains encephalopathic. No change from yesterday.   Assessment & Plan:   Active Problems:   Tracheostomy status (HCC)   Adrenal insufficiency (Addison's disease) (HCC)   Respiratory failure  (HCC)   Renal failure   Pressure injury of skin   Acute respiratory failure with hypoxia (HCC)   ARDS (adult respiratory distress syndrome) (HCC)   Severe sepsis (HCC)   Chronic anoxic encephalopathy (HCC)   Systolic and diastolic CHF, chronic (HCC)   Mucus plugging of bronchi   Abdominal distension   Collapse of left lung   Encephalopathy chronic   Adrenal insufficiency (HCC)   Severe malnutrition (HCC)   Chest tube in place   Sepsis due to pneumonia (HCC)   Severe protein-calorie malnutrition (HCC)   Acute hypoxic respiratory failure secondary to Enterobacter HCAP w/ ARDS and Sepsis  Status post tracheostomy for the third time - trach care per PCCM with no plans to decannulate Patient noted to be more tachypneic 2 days ago and noted to have increase in oxygen requirements. Chest x-ray was done which shows worsening bilateral infiltrates. I believe patient is aspirating as he has done before. Discussed with Dr. Phillips Odor who has spoken extensively to patient's wife. No escalation of care. Continue to support with oxygen. Morphine as needed. Patient has had a very poor quality of life for a long time. There is no chance of meaningful recovery. Comfort care is the main goal. No vent support. -Left chest tube discontinued on 1/4 -1/9 #6 cuffless trach placed. Discussed with pulmonology. Eventually, he can go down to #4 cuff less. But he cannot be decannulated.  Enterobacter cloacae left lung pneumonia w/ extensive infiltrate and atx of L lung -Has completed a full course of antibiotic therapy  -Coag neg Staph in 1 of 2 blood cx , Most c/w contaminant Patient with recurrent aspiration. This is will be an ongoing issue for him considering his overall medical condition. Any intervention that we provide at this time  will only serve to briefly postpone the inevitable. This has been discussed with Dr. Hilma Favors who is agreeable. No antibiotics at this time. Continue to monitor.  Acute on chronic  Encephalopathy  - 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 -Mental status at baseline. Continue Keppra.  Transient Atrial fibrillation -currently NSR. Continue current medications. Blood pressure noted to drop significantly after he is given his cardiac medications. Medications were adjusted. Now focus is on comfort.  Chronic Systolic and Diastolic CHF -EF 51% Aug 0258  - no evidence of volume overload at this time  -Strict in and out since admission + 8.2 L - weight is Relatively stable -Transfuse for hemoglobin<8 -1/9 transfused 1 unit. PRBC  Adrenal insufficiency  - Autonomic dysreflexia Patient was on Florinef and Hydrocortisone. Stopped for comfort care.  Hypernatremia -Free water 300 ml QID. Sodium level is stable.    Hypokalemia Replete  Hypomagnesemia Recheck tomorrow  Severe malnutrition in context of chronic illness -Continue tube feeds per PEG   Goals of care Patient now DO NOT RESUSCITATE. No escalation of care. Prognosis is poor. Comfort care is the main goal now.  DVT prophylaxis: Comfort care Code Status: DNR Family Communication: No family at bedside Disposition Plan: Comfort care. Expect in-hospital death.   Consultants:  PCCM  Procedures/Significant Events:  11/26 Intubated ([facility]otracheally) at [facility] ED - transferred to [facility]  11/29 bronch - left inspissated pus   VENTILATOR SETTINGS: NA   Cultures   Antimicrobials: Anti-infectives    Start     Stop   11/25/16 1500  cefTRIAXone (ROCEPHIN) 1 g in dextrose 5 % 50 mL IVPB     11/25/16 1724   11/18/16 1500  cefTRIAXone (ROCEPHIN) 1 g in dextrose 5 % 50 mL IVPB  Status:  Discontinued     11/25/16 1015   11/11/16 2000  ciprofloxacin (CIPRO) tablet 750 mg     11/11/16 2010   12/01/2016 1600  ciprofloxacin (CIPRO) tablet 750 mg  Status:  Discontinued     11/11/16 0957   11/02/16 1400  cefTRIAXone (ROCEPHIN) 1 g in dextrose 5 % 50 mL IVPB  Status:  Discontinued     12/01/2016 1328   11/01/16  1000  vancomycin (VANCOCIN) IVPB 1000 mg/200 mL premix  Status:  Discontinued     11/03/16 1026   10/31/16 1600  vancomycin (VANCOCIN) IVPB 750 mg/150 ml premix  Status:  Discontinued     11/01/16 0818   10/31/16 0000  piperacillin-tazobactam (ZOSYN) IVPB 3.375 g  Status:  Discontinued     11/02/16 1254   11/02/2016 1600  piperacillin-tazobactam (ZOSYN) IVPB 3.375 g     11/01/2016 1637   10/12/2016 1600  vancomycin (VANCOCIN) IVPB 1000 mg/200 mL premix     10/16/2016 1731      Devices    LINES / TUBES:  PEG PICC 11/27  RUE >> Foley Trache 12/1 >> 1/9 #6 cuffless trach>>   Continuous Infusions: . feeding supplement (VITAL AF 1.2 CAL) 1,000 mL (12/17/16 1233)  . morphine Stopped (12/16/16 2100)     Objective: Vitals:   12/17/16 0302 12/17/16 0400 12/17/16 0500 12/17/16 0600  BP: 98/73  (!) 139/93   Pulse: 80 93 91 86  Resp: 20 (!) 25 20 (!) 23  Temp: 97.7 F (36.5 C)     TempSrc: Oral     SpO2: 98% (!) 84% 97% (!) 88%  Weight:      Height:        Intake/Output Summary (Last 24 hours) at 12/17/16  1319 Last data filed at 12/17/16 0640  Gross per 24 hour  Intake             3250 ml  Output             2225 ml  Net             1025 ml   Filed Weights   12/13/16 0500 12/16/16 0346 12/17/16 0258  Weight: 68.3 kg (150 lb 9.2 oz) 60.8 kg (134 lb) 62.1 kg (137 lb)    Examination:  General: Not responsive, which is baseline. Less agitated compared to yesterday Lungs: As tachypneic as yesterday.  Crackles bilaterally.  Cardiovascular: , S1, S2 is normal, regular. No S3, S4. No rubs, murmurs, or bruit. Abdomen: Nondistended, soft, bowel sounds positive, PEG tube in place without sign of infection Extremities: Stable trace edema B LE      CBC:  Recent Labs Lab 12/12/16 0935 12/13/16 0930 12/15/16 0500  WBC 10.1  --  11.9*  HGB 7.6* 7.7* 8.9*  HCT 24.1* 25.0* 28.4*  MCV 91.6  --  87.9  PLT 349  --  761   Basic Metabolic Panel:  Recent Labs Lab 12/12/16 0935  12/13/16 0635 12/15/16 0500 12/16/16 0535  NA 143 143 143 143  K 3.1* 3.5 3.2* 3.5  CL 106 104 104 102  CO2 '30 30 29 29  '$ GLUCOSE 105* 96 106* 89  BUN '13 12 19 18  '$ CREATININE 0.53* 0.66 0.73 0.80  CALCIUM 8.8* 8.9 8.6* 9.0  MG  --   --   --  2.0   GFR: Estimated Creatinine Clearance: 94.9 mL/min (by C-G formula based on SCr of 0.8 mg/dL).  CBG:  Recent Labs Lab 12/16/16 1933 12/16/16 2344 12/17/16 0304 12/17/16 0736 12/17/16 1201  GLUCAP 79 92 100* 77 97    No results found for this or any previous visit (from the past 240 hour(s)).    Radiology Studies: No results found.   Scheduled Meds: . amiodarone  200 mg Oral BID  . chlorhexidine gluconate (MEDLINE KIT)  15 mL Mouth Rinse BID  . clonazePAM  0.5 mg Per Tube Q6H  . famotidine  20 mg Per Tube BID  . levETIRAcetam  1,500 mg Per Tube BID  . mouth rinse  15 mL Mouth Rinse QID  . QUEtiapine  50 mg Per Tube BID  . sodium chloride flush  10-20 mL Intravenous Q12H  . Valproate Sodium  750 mg Per Tube Q8H   Continuous Infusions: . feeding supplement (VITAL AF 1.2 CAL) 1,000 mL (12/17/16 1233)  . morphine Stopped (12/16/16 2100)     LOS: 48 days    Time spent: 40 minutes    Cleburn Maiolo, MD [facility] Pager [phone]   If 7PM-7AM, please contact night-coverage [email or web address] Password [email or web address] 12/17/2016, 1:19 PM

## 2016-12-17 NOTE — Progress Notes (Signed)
Patient transferred to 6N15, RN and NT present, equipment transferred and set up as well as trach supplies. Unable to reach family RN notified.

## 2016-12-18 NOTE — Progress Notes (Signed)
PROGRESS NOTE    JONAVIN SEDER  UEK:[id number] DOB: 1966/10/09 DOA: 10/07/2016 PCP: No PCP Per Patient   Brief Narrative:  51 y.o. BM with PMHx Cerebral septic emboli ; Cerebral thrombosis with cerebral infarction (03/25/2016),  Convulsions/seizures  (05/04/2016); Dementia (04/27/2016); Depression; Dysphagia (05/04/2016); ETOH abuse;Tobacco abuse , Hydrocephalus Acute encephalopathy;Meningitis, Acute ischemic stroke, Cardiac arrest; Cardiogenic shock, NSTEMI,  Chronic Diastolic and Systolic CHF.  Admission in March 2017 with strep pneumo meningitis c/b cardiac arrest and cardiogenic shock, ARDS requiring prolonged intubation and trach (now decannulated), and acute renal failure. He was admitted to [facility] 06/16/16 through 07/11/16 for recurrent hypoglycemia due to adrenal insufficiency, new subacute CVA, seizures with inability to protect airway s/p intubation 7/13 through 7/21. He re-presented 8/08 again with sepsis from UTI, and was intubated due to AMS, and was trached 8/18, and was discharged with a cuffless trach. That hospitalization was c/b MRSA bacteremia 8/24, and completed 3 weeks of IV vanc. He also developed C. Diff, and was treated with PO vanc through 9/11. He has had continued autonomic dysreflexia, which worsens during complications of acute illnesses.  Prior to this presentation, he had been well, although he was decannulated for reasons that are unclear. He was brought to the ED with decreased level of consciousness and nausea/vomiting/diarrhea. A CXR showed consolidation of the left upper lobe. He was intubated and transferred to [facility]  There have been significant amount of conversations with his wife with regard to goals of care. Subsequently made DO NOT RESUSCITATE. Comfort is the main goal at this time.   Subjective: Patient remains encephalopathic. No change from yesterday.   Assessment & Plan:   Active Problems:   Tracheostomy status (HCC)   Adrenal insufficiency (Addison's  disease) (HCC)   Respiratory failure (HCC)   Renal failure   Pressure injury of skin   Acute respiratory failure with hypoxia (HCC)   ARDS (adult respiratory distress syndrome) (HCC)   Severe sepsis (HCC)   Chronic anoxic encephalopathy (HCC)   Systolic and diastolic CHF, chronic (HCC)   Mucus plugging of bronchi   Abdominal distension   Collapse of left lung   Encephalopathy chronic   Adrenal insufficiency (HCC)   Severe malnutrition (HCC)   Chest tube in place   Sepsis due to pneumonia ([place])   Severe protein-calorie malnutrition ([place])   Acute hypoxic respiratory failure secondary to Enterobacter HCAP w/ ARDS and Sepsis  Status post tracheostomy for the third time - trach care per PCCM with no plans to decannulate Patient noted to be more tachypneic 2 days ago and noted to have increase in oxygen requirements. Chest x-ray was done which shows worsening bilateral infiltrates. Patient is aspirating as he has done before. Discussed with Dr. Hilma Favors who has spoken extensively to patient's wife. No escalation of care. Continue to support with oxygen. Morphine as needed. Patient has had a very poor quality of life for a long time. There is no chance of meaningful recovery. Comfort care is the main goal. No vent support. -Left chest tube discontinued on 1/4 -1/9 #6 cuffless trach placed. Per pulmonology, eventually he can go down to #4 cuff less. But he cannot be decannulated.  Enterobacter cloacae left lung pneumonia w/ extensive infiltrate and atx of L lung -Has completed a full course of antibiotic therapy  -Coag neg Staph in 1 of 2 blood cx , Most c/w contaminant Patient with recurrent aspiration. This is will be an ongoing issue for him considering his overall medical condition. Any intervention that  we provide at this time will only serve to briefly postpone the inevitable. This has been discussed with Dr. Golding who agrees. No antibiotics at this time. Continue to monitor.  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 Continue Keppra.  Transient Atrial fibrillation -Currently NSR. Continue current medications. Blood pressure noted to drop significantly after he is given his cardiac medications. Medications were adjusted. Now focus is on comfort.  Chronic Systolic and Diastolic CHF -EF 40% Aug 2017  - no evidence of volume overload at this time  -Strict in and out since admission + 8.2 L - weight is Relatively stable -Transfuse for hemoglobin<8 -1/9 transfused 1 unit. PRBC  Adrenal insufficiency  - Autonomic dysreflexia Patient was on Florinef and Hydrocortisone. Stopped for comfort care.  Hypernatremia -Free water 300 ml QID. Sodium level is stable.    Hypokalemia Replete  Hypomagnesemia Recheck tomorrow  Severe malnutrition in context of chronic illness -Continue tube feeds per PEG   Goals of care Patient now DO NOT RESUSCITATE. No escalation of care. Prognosis is poor. Comfort care is the main goal now.  DVT prophylaxis: Comfort care Code Status: DNR Family Communication: No family at bedside Disposition Plan: Comfort care. Patient remains stable. Continue management as outlined above. Palliative medicine will need to reevaluate to see if he could go to hospice.   Consultants:  PCCM. PMT  Procedures/Significant Events:  11/26 Intubated (endotracheally) at [facility] ED - transferred to [facility]  11/29 bronch - left inspissated pus   VENTILATOR SETTINGS: NA   Cultures   Antimicrobials: Anti-infectives    Start     Stop   11/25/16 1500  cefTRIAXone (ROCEPHIN) 1 g in dextrose 5 % 50 mL IVPB     11/25/16 1724   11/18/16 1500  cefTRIAXone (ROCEPHIN) 1 g in dextrose 5 % 50 mL IVPB  Status:  Discontinued     11/25/16 1015   11/11/16 2000  ciprofloxacin (CIPRO) tablet 750 mg     11/11/16 2010   11/28/2016 1600  ciprofloxacin (CIPRO) tablet 750 mg  Status:  Discontinued     11/11/16  0957   11/02/16 1400  cefTRIAXone (ROCEPHIN) 1 g in dextrose 5 % 50 mL IVPB  Status:  Discontinued     12/01/2016 1328   11/01/16 1000  vancomycin (VANCOCIN) IVPB 1000 mg/200 mL premix  Status:  Discontinued     11/03/16 1026   10/31/16 1600  vancomycin (VANCOCIN) IVPB 750 mg/150 ml premix  Status:  Discontinued     11/01/16 0818   10/31/16 0000  piperacillin-tazobactam (ZOSYN) IVPB 3.375 g  Status:  Discontinued     11/02/16 1254   10/06/2016 1600  piperacillin-tazobactam (ZOSYN) IVPB 3.375 g     10/17/2016 1637   10/12/2016 1600  vancomycin (VANCOCIN) IVPB 1000 mg/200 mL premix     10/18/2016 1731      Devices    LINES / TUBES:  PEG PICC 11/27  RUE >> Foley Trache 12/1 >> 1/9 #6 cuffless trach>>   Continuous Infusions: . feeding supplement (VITAL AF 1.2 CAL) 1,000 mL (12/18/16 0839)  . morphine Stopped (12/16/16 2100)     Objective: Vitals:   12/17/16 1705 12/18/16 0039 12/18/16 0454 12/18/16 0640  BP:    128/75  Pulse: 90 95 88 82  Resp: (!) 22 20 18 16  Temp:    97.8 F (36.6 C)  TempSrc:    Oral  SpO2:    96%  Weight:      60.9 kg (134 lb 4.3 oz)  Height:        Intake/Output Summary (Last 24 hours) at 12/18/16 0914 Last data filed at 12/18/16 0821  Gross per 24 hour  Intake             1570 ml  Output             1680 ml  Net             -110 ml   Filed Weights   12/16/16 0346 12/17/16 0258 12/18/16 0640  Weight: 60.8 kg (134 lb) 62.1 kg (137 lb) 60.9 kg (134 lb 4.3 oz)    Examination:  General: Not responsive, which is baseline. Less agitated compared to yesterday Lungs: Not as tachypneic. Crackles bilaterally.  Cardiovascular: S1, S2 is normal, regular. No S3, S4. No rubs, murmurs, or bruit. Abdomen: Nondistended, soft, bowel sounds positive, PEG tube in place without sign of infection Extremities: Stable trace edema B LE     CBC:  Recent Labs Lab 12/12/16 0935 12/13/16 0930 12/15/16 0500  WBC 10.1  --  11.9*  HGB 7.6* 7.7* 8.9*  HCT 24.1*  25.0* 28.4*  MCV 91.6  --  87.9  PLT 349  --  380   Basic Metabolic Panel:  Recent Labs Lab 12/12/16 0935 12/13/16 0635 12/15/16 0500 12/16/16 0535  NA 143 143 143 143  K 3.1* 3.5 3.2* 3.5  CL 106 104 104 102  CO2 30 30 29 29  GLUCOSE 105* 96 106* 89  BUN 13 12 19 18  CREATININE 0.53* 0.66 0.73 0.80  CALCIUM 8.8* 8.9 8.6* 9.0  MG  --   --   --  2.0   GFR: Estimated Creatinine Clearance: 93 mL/min (by C-G formula based on SCr of 0.8 mg/dL).  CBG:  Recent Labs Lab 12/16/16 1933 12/16/16 2344 12/17/16 0304 12/17/16 0736 12/17/16 1201  GLUCAP 79 92 100* 77 97    No results found for this or any previous visit (from the past 240 hour(s)).    Radiology Studies: No results found.   Scheduled Meds: . amiodarone  200 mg Oral BID  . chlorhexidine gluconate (MEDLINE KIT)  15 mL Mouth Rinse BID  . clonazePAM  0.5 mg Per Tube Q6H  . famotidine  20 mg Per Tube BID  . levETIRAcetam  1,500 mg Per Tube BID  . mouth rinse  15 mL Mouth Rinse QID  . QUEtiapine  50 mg Per Tube BID  . sodium chloride flush  10-20 mL Intravenous Q12H  . Valproate Sodium  750 mg Per Tube Q8H   Continuous Infusions: . feeding supplement (VITAL AF 1.2 CAL) 1,000 mL (12/18/16 0839)  . morphine Stopped (12/16/16 2100)     LOS: 49 days    Time spent: 40 minutes    ,, MD [facility] Pager [phone]   If 7PM-7AM, please contact night-coverage [email or web address] Password [email or web address] 12/18/2016, 9:14 AM      

## 2016-12-19 NOTE — Progress Notes (Signed)
Nutrition Brief Note  Chart reviewed. Pt now transitioning to comfort care.  No further nutrition interventions warranted at this time.  Please re-consult as needed.   Ginger Leeth A. Keawe Marcello, RD, LDN, CDE Pager: [phone] After hours Pager: [phone]  

## 2016-12-19 NOTE — Progress Notes (Signed)
PROGRESS NOTE    Dale [name]  YQM:[id number] DOB: 1965/12/24 DOA: 10/23/2016 PCP: No PCP Per Patient   Brief Narrative:  51 y.o. BM with PMHx Cerebral septic emboli ; Cerebral thrombosis with cerebral infarction (03/25/2016),  Convulsions/seizures  (05/04/2016); Dementia (04/27/2016); Depression; Dysphagia (05/04/2016); ETOH abuse;Tobacco abuse , Hydrocephalus Acute encephalopathy;Meningitis, Acute ischemic stroke, Cardiac arrest; Cardiogenic shock, NSTEMI,  Chronic Diastolic and Systolic CHF.  Admission in March 2017 with strep pneumo meningitis c/b cardiac arrest and cardiogenic shock, ARDS requiring prolonged intubation and trach (now decannulated), and acute renal failure. He was admitted to [facility] 06/16/16 through 07/11/16 for recurrent hypoglycemia due to adrenal insufficiency, new subacute CVA, seizures with inability to protect airway s/p intubation 7/13 through 7/21. He re-presented 8/08 again with sepsis from UTI, and was intubated due to AMS, and was trached 8/18, and was discharged with a cuffless trach. That hospitalization was c/b MRSA bacteremia 8/24, and completed 3 weeks of IV vanc. He also developed C. Diff, and was treated with PO vanc through 9/11. He has had continued autonomic dysreflexia, which worsens during complications of acute illnesses.  Prior to this presentation, he had been well, although he was decannulated for reasons that are unclear. He was brought to the ED with decreased level of consciousness and nausea/vomiting/diarrhea. A CXR showed consolidation of the left upper lobe. He was intubated and transferred to [facility].  There have been significant amount of conversations with his wife with regard to goals of care. Subsequently made DO NOT RESUSCITATE. Comfort is the main goal at this time.   Subjective: Patient remains encephalopathic. No change noted in the last few days.   Assessment & Plan:   Active Problems:   Tracheostomy status (HCC)   Adrenal insufficiency  (Addison's disease) (HCC)   Respiratory failure (HCC)   Renal failure   Pressure injury of skin   Acute respiratory failure with hypoxia (HCC)   ARDS (adult respiratory distress syndrome) (HCC)   Severe sepsis (HCC)   Chronic anoxic encephalopathy (HCC)   Systolic and diastolic CHF, chronic (HCC)   Mucus plugging of bronchi   Abdominal distension   Collapse of left lung   Encephalopathy chronic   Adrenal insufficiency (HCC)   Severe malnutrition (HCC)   Chest tube in place   Sepsis due to pneumonia ([place])   Severe protein-calorie malnutrition ([place])   Acute hypoxic respiratory failure secondary to Enterobacter HCAP w/ ARDS and Sepsis  Status post tracheostomy for the third time - trach care per PCCM with no plans to decannulate Patient noted to be more tachypneic 2 days ago and noted to have increase in oxygen requirements. Chest x-ray was done which shows worsening bilateral infiltrates. Patient is aspirating as he has done before. Discussed with Dr. Hilma Favors who has spoken extensively to patient's wife. No escalation of care. Continue to support with oxygen. Morphine as needed. Patient has had a very poor quality of life for a long time. There is no chance of meaningful recovery. Comfort care is the main goal. No vent support. -Left chest tube discontinued on 1/4 -1/9 #6 cuffless trach placed. Per pulmonology, eventually he can go down to #4 cuff less. But he cannot be decannulated.  Enterobacter cloacae left lung pneumonia w/ extensive infiltrate and atx of L lung -Has completed a full course of antibiotic therapy  -Coag neg Staph in 1 of 2 blood cx , Most c/w contaminant Patient with recurrent aspiration. This is will be an ongoing issue for him considering his overall medical  condition. No antibiotics at this time. Continue to monitor.  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 Continue Keppra.  Transient Atrial  fibrillation -Currently NSR. Continue current medications. Blood pressure noted to drop significantly after he is given his cardiac medications. Medications were adjusted. Now focus is on comfort.  Chronic Systolic and Diastolic CHF -EF 00% Aug 7622  - no evidence of volume overload at this time  -Strict in and out since admission + 8.2 L - weight is Relatively stable -Transfuse for hemoglobin<8 -1/9 transfused 1 unit. PRBC  Adrenal insufficiency  - Autonomic dysreflexia Patient was on Florinef and Hydrocortisone. Stopped for comfort care.  Hypernatremia -Free water 300 ml QID. Sodium level is stable.    Hypokalemia Replete  Hypomagnesemia Recheck tomorrow  Severe malnutrition in context of chronic illness -Continue tube feeds per PEG   Goals of care Patient now DO NOT RESUSCITATE. No escalation of care. Prognosis is poor. Comfort care is the main goal now. Patient has been stable for the last few days. Wonder if hospice at skilled nursing facility is a possibility for him if he remains stable.  DVT prophylaxis: Comfort care Code Status: DNR Family Communication: No family at bedside Disposition Plan: Comfort care. Patient remains stable. Continue management as outlined above.    Consultants:  PCCM. PMT  Procedures/Significant Events:  11/26 Intubated (endotracheally) at [facility] ED - [facility]  11/29 bronch - left inspissated pus   VENTILATOR SETTINGS: NA   Cultures   Antimicrobials: Anti-infectives    Start     Stop   11/25/16 1500  cefTRIAXone (ROCEPHIN) 1 g in dextrose 5 % 50 mL IVPB     11/25/16 1724   11/18/16 1500  cefTRIAXone (ROCEPHIN) 1 g in dextrose 5 % 50 mL IVPB  Status:  Discontinued     11/25/16 1015   11/11/16 2000  ciprofloxacin (CIPRO) tablet 750 mg     11/11/16 2010   11/26/2016 1600  ciprofloxacin (CIPRO) tablet 750 mg  Status:  Discontinued     11/11/16 0957   11/02/16 1400  cefTRIAXone (ROCEPHIN) 1 g in dextrose 5 % 50 mL IVPB   Status:  Discontinued     11/24/2016 1328   11/01/16 1000  vancomycin (VANCOCIN) IVPB 1000 mg/200 mL premix  Status:  Discontinued     11/03/16 1026   10/31/16 1600  vancomycin (VANCOCIN) IVPB 750 mg/150 ml premix  Status:  Discontinued     11/01/16 0818   10/31/16 0000  piperacillin-tazobactam (ZOSYN) IVPB 3.375 g  Status:  Discontinued     11/02/16 1254   10/10/2016 1600  piperacillin-tazobactam (ZOSYN) IVPB 3.375 g     10/15/2016 1637   10/23/2016 1600  vancomycin (VANCOCIN) IVPB 1000 mg/200 mL premix     10/16/2016 1731      Devices    LINES / TUBES:  PEG PICC 11/27  RUE >> Foley Trache 12/1 >> 1/9 #6 cuffless trach>>   Continuous Infusions: . feeding supplement (VITAL AF 1.2 CAL) 1,000 mL (12/18/16 2341)  . morphine Stopped (12/16/16 2100)     Objective: Vitals:   12/19/16 0032 12/19/16 0321 12/19/16 0500 12/19/16 0850  BP:   (!) 107/54   Pulse: 91 89 86 80  Resp: _0 Temp:   97.3 F (36.3 C)   TempSrc:      SpO2: 94% 93% 93% 97%  Weight:      Height:  Intake/Output Summary (Last 24 hours) at 12/19/16 1126 Last data filed at 12/19/16 0825  Gross per 24 hour  Intake          1863.08 ml  Output             1400 ml  Net           463.08 ml   Filed Weights   12/16/16 0346 12/17/16 0258 12/18/16 0640  Weight: 60.8 kg (134 lb) 62.1 kg (137 lb) 60.9 kg (134 lb 4.3 oz)    Examination:  General: Not responsive, which is baseline. Less agitated. Lungs: Not as tachypneic. Fewer crackles bilaterally.  Cardiovascular: S1, S2 is normal, regular. No S3, S4. No rubs, murmurs, or bruit. Abdomen: . Abdomen is soft. Nondistended. PEG tube is noted. Extremities: Stable trace edema B LE     CBC:  Recent Labs Lab 12/13/16 0930 12/15/16 0500  WBC  --  11.9*  HGB 7.7* 8.9*  HCT 25.0* 28.4*  MCV  --  87.9  PLT  --  161   Basic Metabolic Panel:  Recent Labs Lab 12/13/16 0635 12/15/16 0500 12/16/16 0535  NA 143 143 143  K 3.5 3.2* 3.5  CL 104  104 102  CO2 _0 GLUCOSE 96 106* 89  BUN _1 CREATININE 0.66 0.73 0.80  CALCIUM 8.9 8.6* 9.0  MG  --   --  2.0   GFR: Estimated Creatinine Clearance: 93 mL/min (by C-G formula based on SCr of 0.8 mg/dL).  CBG:  Recent Labs Lab 12/16/16 1933 12/16/16 2344 12/17/16 0304 12/17/16 0736 12/17/16 1201  GLUCAP 79 92 100* 77 97    No results found for this or any previous visit (from the past 240 hour(s)).    Radiology Studies: No results found.   Scheduled Meds: . amiodarone  200 mg Oral BID  . chlorhexidine gluconate (MEDLINE KIT)  15 mL Mouth Rinse BID  . clonazePAM  0.5 mg Per Tube Q6H  . famotidine  20 mg Per Tube BID  . levETIRAcetam  1,500 mg Per Tube BID  . mouth rinse  15 mL Mouth Rinse QID  . QUEtiapine  50 mg Per Tube BID  . sodium chloride flush  10-20 mL Intravenous Q12H  . Valproate Sodium  750 mg Per Tube Q8H   Continuous Infusions: . feeding supplement (VITAL AF 1.2 CAL) 1,000 mL (12/18/16 2341)  . morphine Stopped (12/16/16 2100)     LOS: 50 days    Time spent: 40 minutes    Bonnielee Haff, MD [facility] Pager [phone]   If 7PM-7AM, please contact night-coverage [email or web address] Password [facility] 12/19/2016, 11:26 AM

## 2016-12-20 DIAGNOSIS — J9611 Chronic respiratory failure with hypoxia: Secondary | ICD-10-CM

## 2016-12-20 NOTE — Progress Notes (Signed)
Spoke with Dr. Rito EhrlichKrishnan regarding pt disposition:  Should pt be considered for residential Hospice facility or SNF facility with Hospice follow up?  He asks that I check with Dr. Phillips OdorGolding.  Paged Dr. Phillips OdorGolding with no response.  Will follow up in AM.    Quintella BatonJulie W. Davaughn Hillyard, RN, BSN  Trauma/Neuro ICU Case Manager [phone]

## 2016-12-20 NOTE — Progress Notes (Signed)
PROGRESS NOTE    Dale [name]  YHC:[id number] DOB: [birth year] DOA: 10/05/2016 PCP: No PCP Per Patient   Brief Narrative:  51 y.o. BM with PMHx Cerebral septic emboli ; Cerebral thrombosis with cerebral infarction (03/25/2016), Convulsions/seizures  (05/04/2016); Dementia (04/27/2016); Depression; Dysphagia (05/04/2016); ETOH abuse;Tobacco abuse , Hydrocephalus Acute encephalopathy;Meningitis, Acute ischemic stroke, Cardiac arrest; Cardiogenic shock, NSTEMI,  Chronic Diastolic and Systolic CHF.  Admission in March 2017 with strep pneumo meningitis c/b cardiac arrest and cardiogenic shock, ARDS requiring prolonged intubation and trach (now decannulated), and acute renal failure. He was admitted to [facility] 06/16/16 through 07/11/16 for recurrent hypoglycemia due to adrenal insufficiency, new subacute CVA, seizures with inability to protect airway s/p intubation 7/13 through 7/21. He re-presented 8/08 again with sepsis from UTI, and was intubated due to AMS, and was trached 8/18, and was discharged with a cuffless trach. That hospitalization was c/b MRSA bacteremia 8/24, and completed 3 weeks of IV vanc. He also developed C. Diff, and was treated with PO vanc through 9/11. He has had continued autonomic dysreflexia, which worsens during complications of acute illnesses.  Prior to this presentation, he had been well, although he was decannulated for reasons that are unclear. He was brought to the ED with decreased level of consciousness and nausea/vomiting/diarrhea. A CXR showed consolidation of the left upper lobe. He was intubated and transferred to [facility]. He was stabilized. Taken off of the ventilator.  There have been significant amount of conversations with his wife with regard to goals of care. Subsequently made DO NOT RESUSCITATE. Comfort is the main goal at this time.   Subjective: Patient remains encephalopathic. No change noted in the last few days.   Assessment & Plan:   Active Problems:  Tracheostomy status (HCC)   Adrenal insufficiency (Addison's disease) (HCC)   Respiratory failure (HCC)   Renal failure   Pressure injury of skin   Acute respiratory failure with hypoxia (HCC)   ARDS (adult respiratory distress syndrome) (HCC)   Severe sepsis (HCC)   Chronic anoxic encephalopathy (HCC)   Systolic and diastolic CHF, chronic (HCC)   Mucus plugging of bronchi   Abdominal distension   Collapse of left lung   Encephalopathy chronic   Adrenal insufficiency (HCC)   Severe malnutrition (HCC)   Chest tube in place   Sepsis due to pneumonia ([place])   Severe protein-calorie malnutrition ([place])   Acute hypoxic respiratory failure secondary to Enterobacter HCAP w/ ARDS and Sepsis  Status post tracheostomy for the third time - trach care per PCCM with no plans to decannulate Patient noted to be more tachypneic 2 days ago and noted to have increase in oxygen requirements. Chest x-ray was done which shows worsening bilateral infiltrates. Patient is aspirating as he has done before. Discussed with Dr. Hilma Favors who has spoken extensively to patient's wife. No escalation of care. Continue to support with oxygen. Morphine as needed. Patient has had a very poor quality of life for a long time. There is no chance of meaningful recovery. Comfort care is the main goal. No vent support. -Left chest tube discontinued on 1/4 -1/9 #6 cuffless trach placed. Per pulmonology, eventually he can go down to #4 cuff less. But he cannot be decannulated.  Enterobacter cloacae left lung pneumonia w/ extensive infiltrate and atx of L lung -Has completed a full course of antibiotic therapy  -Coag neg Staph in 1 of 2 blood cx , Most c/w contaminant Patient with recurrent aspiration. This is will be an ongoing issue for  him considering his overall medical condition. No antibiotics at this time. Continue to monitor.  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 Continue Keppra.  Transient Atrial fibrillation -Currently NSR. Continue current medications. Blood pressure noted to drop significantly after he is given his cardiac medications. Medications were adjusted. Now focus is on comfort.  Chronic Systolic and Diastolic CHF -EF 46% Aug 5681  - no evidence of volume overload at this time  -Strict in and out since admission + 8.2 L - weight is Relatively stable -Transfuse for hemoglobin<8 -1/9 transfused 1 unit. PRBC  Adrenal insufficiency  - Autonomic dysreflexia Patient was on Florinef and Hydrocortisone. Stopped for comfort care.  Hypernatremia -Free water 300 ml QID. Sodium level is stable.    Hypokalemia Replete  Hypomagnesemia Recheck tomorrow  Severe malnutrition in context of chronic illness -Continue tube feeds per PEG   Goals of care Patient now DO NOT RESUSCITATE. No escalation of care. Prognosis is poor. Comfort care is the main goal now. Patient has been stable for the last few days. Wonder if hospice at skilled nursing facility is a possibility for him if he remains stable. We'll need to discuss this with Dr. Hilma Favors who has been primarily communicating with patient's wife.  DVT prophylaxis: Comfort care Code Status: DNR Family Communication: No family at bedside Disposition Plan: Comfort care. Patient remains stable. Continue management as outlined above.    Consultants:  PCCM. PMT  Procedures/Significant Events:  11/26 Intubated (endotracheally) at [facility] ED - transferred to [facility]  11/29 bronch - left inspissated pus   VENTILATOR SETTINGS: NA   Cultures   Antimicrobials: Anti-infectives    Start     Stop   11/25/16 1500  cefTRIAXone (ROCEPHIN) 1 g in dextrose 5 % 50 mL IVPB     11/25/16 1724   11/18/16 1500  cefTRIAXone (ROCEPHIN) 1 g in dextrose 5 % 50 mL IVPB  Status:  Discontinued     11/25/16 1015   11/11/16 2000  ciprofloxacin (CIPRO) tablet 750 mg     11/11/16 2010   11/25/2016 1600   ciprofloxacin (CIPRO) tablet 750 mg  Status:  Discontinued     11/11/16 0957   11/02/16 1400  cefTRIAXone (ROCEPHIN) 1 g in dextrose 5 % 50 mL IVPB  Status:  Discontinued     11/13/2016 1328   11/01/16 1000  vancomycin (VANCOCIN) IVPB 1000 mg/200 mL premix  Status:  Discontinued     11/03/16 1026   10/31/16 1600  vancomycin (VANCOCIN) IVPB 750 mg/150 ml premix  Status:  Discontinued     11/01/16 0818   10/31/16 0000  piperacillin-tazobactam (ZOSYN) IVPB 3.375 g  Status:  Discontinued     11/02/16 1254   10/07/2016 1600  piperacillin-tazobactam (ZOSYN) IVPB 3.375 g     10/06/2016 1637   10/17/2016 1600  vancomycin (VANCOCIN) IVPB 1000 mg/200 mL premix     10/11/2016 1731      Devices    LINES / TUBES:  PEG PICC 11/27  RUE >> Foley Trache 12/1 >> 1/9 #6 cuffless trach>>   Continuous Infusions: . feeding supplement (VITAL AF 1.2 CAL) 1,000 mL (12/20/16 0620)  . morphine Stopped (12/16/16 2100)     Objective: Vitals:   12/19/16 2328 12/20/16 0325 12/20/16 0447 12/20/16 0850  BP:   122/78   Pulse: 85 85 83 89  Resp: '17 15 16 18  '$ Temp:   97.6 F (36.4 C)   TempSrc:  Axillary   SpO2: 94% 95% 95% 94%  Weight:      Height:        Intake/Output Summary (Last 24 hours) at 12/20/16 0913 Last data filed at 12/20/16 0910  Gross per 24 hour  Intake               40 ml  Output              450 ml  Net             -410 ml   Filed Weights   12/16/16 0346 12/17/16 0258 12/18/16 0640  Weight: 60.8 kg (134 lb) 62.1 kg (137 lb) 60.9 kg (134 lb 4.3 oz)    Examination:  General: Not responsive, which is baseline. Lungs: Not as tachypneic. Fewer crackles bilaterally.  Cardiovascular: S1, S2 is normal, regular. No S3, S4. No rubs, murmurs, or bruit. Abdomen: . Abdomen is soft. Nondistended. PEG tube is noted. Extremities: Stable trace edema B LE     CBC:  Recent Labs Lab 12/13/16 0930 12/15/16 0500  WBC  --  11.9*  HGB 7.7* 8.9*  HCT 25.0* 28.4*  MCV  --  87.9  PLT  --   481   Basic Metabolic Panel:  Recent Labs Lab 12/15/16 0500 12/16/16 0535  NA 143 143  K 3.2* 3.5  CL 104 102  CO2 29 29  GLUCOSE 106* 89  BUN 19 18  CREATININE 0.73 0.80  CALCIUM 8.6* 9.0  MG  --  2.0   GFR: Estimated Creatinine Clearance: 93 mL/min (by C-G formula based on SCr of 0.8 mg/dL).  CBG:  Recent Labs Lab 12/16/16 1933 12/16/16 2344 12/17/16 0304 12/17/16 0736 12/17/16 1201  GLUCAP 79 92 100* 77 97    No results found for this or any previous visit (from the past 240 hour(s)).    Radiology Studies: No results found.   Scheduled Meds: . amiodarone  200 mg Oral BID  . chlorhexidine gluconate (MEDLINE KIT)  15 mL Mouth Rinse BID  . clonazePAM  0.5 mg Per Tube Q6H  . famotidine  20 mg Per Tube BID  . levETIRAcetam  1,500 mg Per Tube BID  . mouth rinse  15 mL Mouth Rinse QID  . QUEtiapine  50 mg Per Tube BID  . sodium chloride flush  10-20 mL Intravenous Q12H  . Valproate Sodium  750 mg Per Tube Q8H   Continuous Infusions: . feeding supplement (VITAL AF 1.2 CAL) 1,000 mL (12/20/16 0620)  . morphine Stopped (12/16/16 2100)     LOS: 51 days    Time spent: 40 minutes    Ahmira Boisselle, MD [facility] Pager [phone]   If 7PM-7AM, please contact night-coverage [email or web address] Password [facility] 12/20/2016, 9:13 AM

## 2016-12-21 DIAGNOSIS — A0472 Enterocolitis due to Clostridium difficile, not specified as recurrent: Secondary | ICD-10-CM | POA: Clinically undetermined

## 2016-12-21 LAB — C DIFFICILE QUICK SCREEN W PCR REFLEX
C DIFFICILE (CDIFF) INTERP: DETECTED
C DIFFICLE (CDIFF) ANTIGEN: POSITIVE — AB
C Diff toxin: POSITIVE — AB

## 2016-12-21 MED ORDER — VANCOMYCIN 50 MG/ML ORAL SOLUTION
125.0000 mg | Freq: Four times a day (QID) | ORAL | Status: DC
  Administered 2016-12-21 – 2016-12-23 (×6): 125 mg via ORAL
  Filled 2016-12-21 (×10): qty 2.5

## 2016-12-21 NOTE — Progress Notes (Signed)
Patient Demographics:    Dale [name], is a 51 y.o. male, DOB - 02-Apr-1966, ZOX:[id number]  Admit date - 10/23/2016   Admitting Physician Rush Landmark, MD  Outpatient Primary MD for the patient is No PCP Per Patient  LOS - 73   Chief Complaint  Patient presents with  . Diarrhea        Subjective:    Dale [name] today has no fevers, no emesis,  Non- verbal  Assessment  & Plan :    Active Problems:   Tracheostomy status (HCC)   Adrenal insufficiency (Addison's disease) (HCC)   Respiratory failure (HCC)   Renal failure   Pressure injury of skin   Acute respiratory failure with hypoxia (HCC)   ARDS (adult respiratory distress syndrome) (HCC)   Severe sepsis (HCC)   Chronic anoxic encephalopathy (HCC)   Systolic and diastolic CHF, chronic (HCC)   Mucus plugging of bronchi   Abdominal distension   Collapse of left lung   Encephalopathy chronic   Adrenal insufficiency (HCC)   Severe malnutrition (HCC)   Chest tube in place   Sepsis due to pneumonia ([place])   Severe protein-calorie malnutrition ([place])  Brief Narrative:  51 y.o.BM with PMHx Cerebral septic emboli ; Cerebral thrombosis with cerebral infarction (03/25/2016), Convulsions/seizures  (05/04/2016); Dementia (04/27/2016); Depression; Dysphagia (05/04/2016); ETOH abuse;Tobacco abuse , Hydrocephalus Acute encephalopathy;Meningitis, Acute ischemic stroke,Cardiac arrest; Cardiogenic shock, NSTEMI,  Chronic Diastolic and Systolic CHF.  Admission in March 2017 with strep pneumo meningitis c/b cardiac arrest and cardiogenic shock, ARDS requiring prolonged intubation and trach (now decannulated), and acute renal failure. He was admitted to [facility] 06/16/16 through 07/11/16 for recurrent hypoglycemia due to adrenal insufficiency, new subacute CVA, seizures with inability to protect airway s/p intubation 7/13 through 7/21. He re-presented 8/08 again  with sepsis from UTI, and was intubated due to AMS, and was trached 8/18, and was discharged with a cuffless trach. That hospitalization was c/b MRSA bacteremia 8/24, and completed 3 weeks of IV vanc. He also developed C. Diff, and was treated with PO vanc through 9/11. He has had continued autonomic dysreflexia, which worsens during complications of acute illnesses.  Prior to this presentation, he had been well, although he was decannulated for reasons that are unclear. He was brought to the ED with decreased level of consciousness and nausea/vomiting/diarrhea. A CXR showed consolidation of the left upper lobe. He was intubated and transferred to [facility]. He was stabilized. Taken off of the ventilator.  There have been significant amount of conversations with his wife with regard to goals of care. Subsequently made DO NOT RESUSCITATE. Comfort is the main goal at this time.  1)C diff Colitis- vancomycin sol via peg tube  2)Acute hypoxic respiratory failure secondary to Enterobacter HCAP w/ ARDS and Sepsis  Status post tracheostomy for the third time - trach care per PCCM with no plans to decannulate Patient noted to be more tachypneic 2 days ago and noted to have increase in oxygen requirements. Chest x-ray was done which shows worsening bilateral infiltrates. Patient is aspirating as he has done before. Discussed with Dr. Hilma Favors who has spoken extensively to patient's wife. No escalation of care. Continue to support with oxygen. Morphine as needed. Patient has had a very  poor quality of life for a long time. There is no chance of meaningful recovery. Comfort care is the main goal. No vent support. -Left chest tube discontinued on 1/4 -1/9 #6 cuffless trach placed. Per pulmonology, eventually he can go down to #4 cuff less. But he cannot be decannulated.  3)Enterobacter cloacae left lung pneumonia w/ extensive infiltrate and atx of L lung -Has completed a full course of antibiotic therapy  -Coag neg  Staph in 1 of 2 blood cx , Most c/w contaminant Patient with recurrent aspiration. This is will be an ongoing issue for him considering his overall medical condition. No antibiotics at this time. Continue to monitor.  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 Continue Keppra.  5)Transient Atrial fibrillation -Currently NSR. Continue current medications. Blood pressure noted to drop significantly after he is given his cardiac medications. Medications were adjusted. Now focus is on comfort.  6)Chronic Systolic and Diastolic CHF -EF 33% Aug 2951  - no evidence of volume overload at this time  -Strict in and out since admission + 8.2 L - weight is Relatively stable -Transfuse for hemoglobin<8 -1/9 transfused 1 unit. PRBC  7)Adrenal insufficiency  - Autonomic dysreflexia Patient was on Florinef and Hydrocortisone. Stopped for comfort care.  8)FEN/Hypernatremia/Hypokalemia/Hypomagnesemia- replace and c/n H20 flushes  9)Severe malnutrition in context of chronic illness -Continue tube feeds per PEG   Goals of care Patient now DO NOT RESUSCITATE. No escalation of care. Prognosis is poor. Comfort care is the main goal now. Patient has been stable for the last few days. Wonder if hospice at skilled nursing facility is a possibility for him if he remains stable. We'll need to discuss this with Dr. Hilma Favors who has been primarily communicating with patient's wife.  DVT prophylaxis: Comfort care Code Status: DNR Family Communication: No family at bedside Disposition Plan: Comfort care. Patient remains stable. Continue management as outlined above.    Consultants:  PCCM. PMT  Procedures/Significant Events:  11/26 Intubated (endotracheally) at [facility] - [facility]  11/29 bronch - left inspissated pus  Code Status : DNR  Disposition Plan  : ?? Hospice  Lab Results  Component Value Date   PLT 380  12/15/2016    Inpatient Medications  Scheduled Meds: . amiodarone  200 mg Oral BID  . chlorhexidine gluconate (MEDLINE KIT)  15 mL Mouth Rinse BID  . clonazePAM  0.5 mg Per Tube Q6H  . famotidine  20 mg Per Tube BID  . levETIRAcetam  1,500 mg Per Tube BID  . mouth rinse  15 mL Mouth Rinse QID  . QUEtiapine  50 mg Per Tube BID  . sodium chloride flush  10-20 mL Intravenous Q12H  . Valproate Sodium  750 mg Per Tube Q8H   Continuous Infusions: . feeding supplement (VITAL AF 1.2 CAL) 1,000 mL (12/20/16 2329)  . morphine Stopped (12/16/16 2100)   PRN Meds:.acetaminophen, diphenhydrAMINE, fentaNYL (SUBLIMAZE) injection, glycopyrrolate **OR** glycopyrrolate **OR** glycopyrrolate, haloperidol **OR** haloperidol **OR** haloperidol lactate, levalbuterol, LORazepam, morphine injection, morphine, ondansetron **OR** ondansetron (ZOFRAN) IV, polyvinyl alcohol, sodium chloride flush, sodium chloride flush    Anti-infectives    Start     Dose/Rate Route Frequency Ordered Stop   11/25/16 1500  cefTRIAXone (ROCEPHIN) 1 g in dextrose 5 % 50 mL IVPB     1 g 100 mL/hr over 30 Minutes Intravenous Every 24 hours 11/25/16 1015 11/25/16 1724   11/18/16 1500  cefTRIAXone (ROCEPHIN) 1 g in  dextrose 5 % 50 mL IVPB  Status:  Discontinued     1 g 100 mL/hr over 30 Minutes Intravenous Every 24 hours 11/18/16 1429 11/25/16 1015   11/11/16 2000  ciprofloxacin (CIPRO) tablet 750 mg     750 mg Per Tube 2 times daily 11/11/16 0957 11/11/16 2010   11/30/2016 1600  ciprofloxacin (CIPRO) tablet 750 mg  Status:  Discontinued     750 mg Oral 2 times daily 11/08/2016 1331 11/11/16 0957   11/02/16 1400  cefTRIAXone (ROCEPHIN) 1 g in dextrose 5 % 50 mL IVPB  Status:  Discontinued     1 g 100 mL/hr over 30 Minutes Intravenous Every 24 hours 11/02/16 1254 11/26/2016 1328   11/01/16 1000  vancomycin (VANCOCIN) IVPB 1000 mg/200 mL premix  Status:  Discontinued     1,000 mg 200 mL/hr over 60 Minutes Intravenous Every 12 hours  11/01/16 0818 11/03/16 1026   10/31/16 1600  vancomycin (VANCOCIN) IVPB 750 mg/150 ml premix  Status:  Discontinued     750 mg 150 mL/hr over 60 Minutes Intravenous Every 24 hours 11/02/2016 1624 11/01/16 0818   10/31/16 0000  piperacillin-tazobactam (ZOSYN) IVPB 3.375 g  Status:  Discontinued     3.375 g 12.5 mL/hr over 240 Minutes Intravenous Every 8 hours 10/13/2016 1623 11/02/16 1254   10/14/2016 1600  piperacillin-tazobactam (ZOSYN) IVPB 3.375 g     3.375 g 100 mL/hr over 30 Minutes Intravenous  Once 11/01/2016 1553 11/01/2016 1637   10/25/2016 1600  vancomycin (VANCOCIN) IVPB 1000 mg/200 mL premix     1,000 mg 200 mL/hr over 60 Minutes Intravenous  Once 11/03/2016 1553 10/18/2016 1731        Objective:   Vitals:   12/20/16 1244 12/20/16 1544 12/20/16 2111 12/21/16 0520  BP:    94/73  Pulse: 87 94 98 71  Resp: '18 19 18 18  '$ Temp:      TempSrc:      SpO2: 93% 91% 92% 97%  Weight:      Height:        Wt Readings from Last 3 Encounters:  12/18/16 60.9 kg (134 lb 4.3 oz)  08/10/16 64.7 kg (142 lb 10.2 oz)  07/11/16 60.3 kg (133 lb)     Intake/Output Summary (Last 24 hours) at 12/21/16 0946 Last data filed at 12/21/16 0900  Gross per 24 hour  Intake                0 ml  Output             1150 ml  Net            -1150 ml     Physical Exam  Gen:- non- verbal HEENT:- [place].AT, No sclera icterus Neck- Trach with collar Lungs-  CTAB  CV- S1, S2 normal Abd-  +ve B.Sounds, Abd Soft, No tenderness, peg tube site is C/D/I     Extremity/Skin:- No  edema,       Data Review:   Micro Results No results found for this or any previous visit (from the past 240 hour(s)).  Radiology Reports Ct Chest Wo Contrast  Result Date: 11/22/2016 CLINICAL DATA:  Left chest opacification EXAM: CT CHEST WITHOUT CONTRAST TECHNIQUE: Multidetector CT imaging of the chest was performed following the standard protocol without IV contrast. COMPARISON:  Multiple chest radiographs most recently yesterday.  FINDINGS: Cardiovascular: Heart size is normal. Very small amount of pericardial fluid. No aortic or coronary calcification identified. Mediastinum/Nodes: Mediastinal shift towards the  right. No pathologically enlarged lymph nodes identified. Lungs/Pleura: Complete collapse of left lung. Large pleural effusion surrounding the left lung. Mass effect with mediastinal shift towards the right. Moderate-sized dependent effusion on the right with dependent pulmonary atelectasis. Nondependent lung is largely clear. Abnormal density in the right upper lobe suggests developing bronchopneumonia. Focal areas of irregular nodular shadows within the right lung are nonspecific and could represent foci of atelectasis or pneumonia. Neoplastic mass lesions seem unlikely given the generally clear previous chest radiography on the right. Upper Abdomen: Negative Musculoskeletal: Normal IMPRESSION: Very large left effusion. Complete collapse of left lung. Mass effect with left-to-right mediastinal shift. Moderate size right effusion layering dependently. Dependent pulmonary atelectasis. Possible developing pneumonia right upper lobe. Areas of patchy pulmonary density in the right lung could represent atelectasis or nodular infiltrates. Mass lesions seem less likely, but follow-up should be performed. Electronically Signed   By: Nelson Chimes M.D.   On: 11/22/2016 07:18   Dg Chest Port 1 View  Result Date: 12/15/2016 CLINICAL DATA:  Hypoxia. EXAM: PORTABLE CHEST 1 VIEW COMPARISON:  12/09/2016 . FINDINGS: Tracheostomy tube and right PICC line stable position. Cardiomegaly with normal pulmonary vascularity. Bilateral pulmonary infiltrates, progressed from prior exam. Bilateral pleural effusions. Similar findings noted on prior exam. IMPRESSION: 1. Tracheostomy tube and right PICC line stable position. 2. Bilateral pulmonary infiltrates and bilateral pleural effusions noted. Pulmonary infiltrates have progressed from prior exam.  Electronically Signed   By: [place]   On: 12/15/2016 11:42   Dg Chest Port 1 View  Result Date: 12/09/2016 CLINICAL DATA:  Respiratory failure EXAM: PORTABLE CHEST 1 VIEW COMPARISON:  12/07/2016 FINDINGS: Cardiac shadow is stable. Tracheostomy tube and right-sided PICC line are again seen and stable. There is been interval removal of the left-sided chest tube without recurrent pneumothorax. Bilateral pleural effusions are again seen and stable. No new focal infiltrate is seen. IMPRESSION: Stable bilateral pleural effusions. Interval chest tube removal on the left without recurrent pneumothorax. Tubes and lines as described. Electronically Signed   By: Inez Catalina M.D.   On: 12/09/2016 07:53   Dg Chest Port 1 View  Result Date: 12/07/2016 CLINICAL DATA:  Pneumothorax. EXAM: PORTABLE CHEST 1 VIEW COMPARISON:  Radiograph May 05, 2017. FINDINGS: Stable cardiomediastinal silhouette. Endotracheal tube is unchanged in position. Right-sided PICC line is again noted and unchanged. Left-sided chest tube is unchanged. No pneumothorax is noted. Stable probable small loculated pleural effusion along left lateral chest wall. Probable layering effusion seen in right lung. Stable left upper lobe opacity concerning for atelectasis or infiltrate. Bony thorax is unremarkable. IMPRESSION: Stable support apparatus. No pneumothorax is noted. Stable probable loculated left pleural effusion along left chest wall. Stable probable right pleural effusion. Stable left upper lobe opacity concerning for atelectasis or infiltrate. Electronically Signed   By: Marijo Conception, M.D.   On: 12/07/2016 07:42   Dg Chest Port 1 View  Result Date: 12/05/2016 CLINICAL DATA:  Followup exam for pleural effusion in possible pneumothorax. Left chest tube. EXAM: PORTABLE CHEST 1 VIEW COMPARISON:  11/23/2016 FINDINGS: Left chest tube is stable with its tip at the left apex. No pneumothorax. Airspace consolidation is noted on the left most  apparent peripherally. There is a small residual left pleural effusion. Hazy airspace opacity has developed throughout most of the right lung. Probable small right pleural effusion. Right PICC and tracheostomy tube are stable and well positioned. IMPRESSION: 1. Worsened lung aeration on the right with new hazy airspace opacity throughout most of  the right lung. Probable small right pleural effusion. 2. Persistent airspace opacities in the left lung with a small residual pleural effusion. 3. Stable chest tube.  No pneumothorax. Electronically Signed   By: Lajean Manes M.D.   On: 12/05/2016 07:22   Dg Chest Port 1 View  Result Date: 11/23/2016 CLINICAL DATA:  Status post chest tube placement EXAM: PORTABLE CHEST 1 VIEW COMPARISON:  11/22/2016 FINDINGS: Recent chest tube placement has been performed. No pneumothorax is noted. Significant reduction in pleural fluid is seen. There is also significant improved aeration in the left lung. Persistent infiltrate is noted. Right PICC line and tracheostomy tube are again noted. IMPRESSION: Status post chest tube placement with significant re-expansion of the left lung and reduction in left pleural effusion. Electronically Signed   By: Inez Catalina M.D.   On: 11/23/2016 15:51   Dg Chest Portable 1 View  Result Date: 11/21/2016 CLINICAL DATA:  Pleural effusion.  Tracheostomy tube . EXAM: PORTABLE CHEST 1 VIEW COMPARISON:  11/20/2016. FINDINGS: Tracheostomy tube noted in stable position. Right PICC line noted in stable position. Complete opacification of the left hemithorax again noted. Shift of the mediastinum heart the right noted. These findings again suggest a left pleural effusion. Underlying left lung consolidation cannot be excluded. IMPRESSION: 1. Tracheostomy tube in stable position. 2. Unchanged chest with complete opacification of the left hemithorax. Findings consistent with left pleural effusion. Underlying left lung consolidation cannot be excluded .  Electronically Signed   By: Marcello Moores  Register   On: 11/21/2016 10:24     CBC  Recent Labs Lab 12/15/16 0500  WBC 11.9*  HGB 8.9*  HCT 28.4*  PLT 380  MCV 87.9  MCH 27.6  MCHC 31.3  RDW 18.6*    Chemistries   Recent Labs Lab 12/15/16 0500 12/16/16 0535  NA 143 143  K 3.2* 3.5  CL 104 102  CO2 29 29  GLUCOSE 106* 89  BUN 19 18  CREATININE 0.73 0.80  CALCIUM 8.6* 9.0  MG  --  2.0   ------------------------------------------------------------------------------------------------------------------ No results for input(s): CHOL, HDL, LDLCALC, TRIG, CHOLHDL, LDLDIRECT in the last 72 hours.  Lab Results  Component Value Date   HGBA1C 5.3 05/20/2016   ------------------------------------------------------------------------------------------------------------------ No results for input(s): TSH, T4TOTAL, T3FREE, THYROIDAB in the last 72 hours.  Invalid input(s): FREET3 ------------------------------------------------------------------------------------------------------------------ No results for input(s): VITAMINB12, FOLATE, FERRITIN, TIBC, IRON, RETICCTPCT in the last 72 hours.  Coagulation profile No results for input(s): INR, PROTIME in the last 168 hours.  No results for input(s): DDIMER in the last 72 hours.  Cardiac Enzymes No results for input(s): CKMB, TROPONINI, MYOGLOBIN in the last 168 hours.  Invalid input(s): CK ------------------------------------------------------------------------------------------------------------------    Component Value Date/Time   BNP 23.8 03/20/2016 1800     Camarion Weier M.D on 12/21/2016 at 9:46 AM  Between 7am to 7pm - Pager - [phone]  After 7pm go to [email or web address] - password [email or web address]  [facility] -  Office  [phone]  Dragon dictation system was used to create this note, attempts have been made to correct errors, however presence of uncorrected errors is not a reflection quality of care provided

## 2016-12-21 NOTE — Progress Notes (Signed)
Dr. Mariea ClontsEmokpae texed paged patient positive for C-Diff

## 2016-12-22 NOTE — Progress Notes (Signed)
Patient Demographics:    Dale [name], is a 51 y.o. male, DOB - Nov 23, 1966, XNT:[id number]  Admit date - 10/27/2016   Admitting Physician Rush Landmark, MD  Outpatient Primary MD for the patient is No PCP Per Patient  LOS - 82   Chief Complaint  Patient presents with  . Diarrhea        Subjective:    Dale [name] today has no fevers, no emesis,  Had hypothermia (warming blanket placed)   Assessment  & Plan :    Active Problems:   Tracheostomy status (HCC)   Adrenal insufficiency (Addison's disease) (HCC)   Respiratory failure (HCC)   Renal failure   Pressure injury of skin   Acute respiratory failure with hypoxia (HCC)   ARDS (adult respiratory distress syndrome) (HCC)   Severe sepsis (HCC)   Chronic anoxic encephalopathy (HCC)   Systolic and diastolic CHF, chronic (HCC)   Mucus plugging of bronchi   Abdominal distension   Collapse of left lung   Encephalopathy chronic   Adrenal insufficiency (HCC)   Severe malnutrition (HCC)   Chest tube in place   Sepsis due to pneumonia ([place])   Severe protein-calorie malnutrition ([place])   Enteritis due to Clostridium difficile  Brief Narrative: 51 y.o.BM with PMHxCerebral septic emboli ; Cerebral thrombosis with cerebral infarction (03/25/2016), Convulsions/seizures (05/04/2016); Dementia (04/27/2016); Depression; Dysphagia (05/04/2016); ETOH abuse;Tobacco abuse , Hydrocephalus Acute encephalopathy;Meningitis, Acute ischemic stroke,Cardiac arrest; Cardiogenic shock, NSTEMI, Chronic Diastolic and Systolic CHF.  Admission in March 2017with strep pneumo meningitis c/b cardiac arrest and cardiogenic shock, ARDS requiring prolonged intubation and trach (now decannulated), and acute renal failure. He was admitted to [facility] 7/13/17through8/7/17 for recurrent hypoglycemia due to adrenal insufficiency, new subacute CVA, seizures with inability to protect  airway s/p intubation 7/13 through 7/21. He re-presented8/08 again with sepsis from UTI, and was intubated due to AMS, and wastrached 8/18, and was discharged with a cuffless trach. That hospitalization was c/b MRSA bacteremia 8/24, and completed 3 weeks of IV vanc. He also developed C. Diff, and was treated with PO vanc through 9/11. He has had continued autonomic dysreflexia, which worsens during complications of acute illnesses.  Prior to this presentation, he had been well, although he was decannulated for reasons that are unclear. He was brought to the ED with decreased level of consciousness and nausea/vomiting/diarrhea. A CXR showed consolidation of the left upper lobe. He was intubated and transferred to [facility]. He was stabilized. Taken off of the ventilator.  There have been significant amount of conversations with his wife with regard to goals of care. Subsequently made DO NOT RESUSCITATE. Comfort is the main goal at this time.  1)C diff Colitis- vancomycin sol via peg tube  2)Acute hypoxic respiratory failure secondary to Enterobacter HCAP w/ ARDS and Sepsis Status post tracheostomy for the third time - trach care per PCCM with no plans to decannulate Patient noted to be more tachypneic 2 days ago and noted to have increase in oxygen requirements. Chest x-ray was done which shows worsening bilateral infiltrates. Patient is aspirating as he has done before. Discussed with Dr. Hilma Favors who has spoken extensively to patient's wife. No escalation of care. Continue to support with oxygen. Morphine as needed. Patient has had a  very poor quality of life for a long time. There is no chance of meaningful recovery. Comfort care is the main goal. No vent support. -Left chest tube discontinued on 1/4 -1/9 #6 cuffless trach placed. Per pulmonology, eventually he can go down to #4 cuff less. But he cannot be decannulated.  3)Enterobacter cloacae left lung pneumonia w/ extensive infiltrate and atx of  L lung -Has completed a full course of antibiotic therapy  -Coag neg Staph in 1 of 2 blood cx , Most c/w contaminant Patient with recurrent aspiration. This is will be an ongoing issue for him considering his overall medical condition. No antibiotics at this time. Continue to monitor.  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 Continue Keppra.  5)Transient Atrial fibrillation -Currently NSR. Continue amiodarone. focus is on comfort.  6)Chronic Systolic and Diastolic CHF -EF 72% Aug 0947  - no evidence of volume overload at this time    7)Adrenal insufficiency  - Autonomic dysreflexia Patient was on Florinef and Hydrocortisone. Stopped for comfort care.  8)FEN/Hypernatremia/Hypokalemia/Hypomagnesemia-  palliative care physician stop tube feeding and start IV morphine sulfate for comfort   9)Anemia- avoid transfusionsTransfuse for hemoglobin<8 -1/9transfused 1 unit. PRBC    Goals of care Patient now DO NOT RESUSCITATE. No escalation of care. Prognosis is poor. Comfort care is the main goal now. Patient has been stable for the last few days. Wonder if hospice at skilled nursing facility is a possibility for him if he remains stable. As per Dr. Hilma Favors who has been primarily communicating with patient's wife, okay to stop PEG tube feeding and start IV morphine sulfate for comfort   DVT prophylaxis:Comfort care Code Status:DNR Family Communication:No family at bedside Disposition Plan:Comfort care. Patient remains stable. Continue management as outlined above.    Consultants: PCCM. PMT  Procedures/Significant Events: 11/26 Intubated (endotracheally) at [facility] ED - [facility]  11/29 bronch - left inspissated pus  Code Status : DNR  Disposition Plan  : ?? Hospice  Lab Results  Component Value Date   PLT 380 12/15/2016    Inpatient Medications  Scheduled Meds: . amiodarone  200 mg  Oral BID  . chlorhexidine gluconate (MEDLINE KIT)  15 mL Mouth Rinse BID  . clonazePAM  0.5 mg Per Tube Q6H  . famotidine  20 mg Per Tube BID  . levETIRAcetam  1,500 mg Per Tube BID  . mouth rinse  15 mL Mouth Rinse QID  . QUEtiapine  50 mg Per Tube BID  . sodium chloride flush  10-20 mL Intravenous Q12H  . Valproate Sodium  750 mg Per Tube Q8H  . vancomycin  125 mg Oral QID   Continuous Infusions: . morphine 1 mg/hr (12/22/16 0841)   PRN Meds:.acetaminophen, diphenhydrAMINE, fentaNYL (SUBLIMAZE) injection, glycopyrrolate **OR** glycopyrrolate **OR** glycopyrrolate, haloperidol **OR** haloperidol **OR** haloperidol lactate, levalbuterol, LORazepam, morphine injection, morphine, ondansetron **OR** ondansetron (ZOFRAN) IV, polyvinyl alcohol, sodium chloride flush, sodium chloride flush    Anti-infectives    Start     Dose/Rate Route Frequency Ordered Stop   12/21/16 1845  vancomycin (VANCOCIN) 50 mg/mL oral solution 125 mg     125 mg Oral 4 times daily 12/21/16 1833 01/04/17 1759   11/25/16 1500  cefTRIAXone (ROCEPHIN) 1 g in dextrose 5 % 50 mL IVPB     1 g 100 mL/hr over 30 Minutes Intravenous Every 24 hours 11/25/16 1015 11/25/16 1724   11/18/16 1500  cefTRIAXone (ROCEPHIN) 1 g in dextrose 5 %  50 mL IVPB  Status:  Discontinued     1 g 100 mL/hr over 30 Minutes Intravenous Every 24 hours 11/18/16 1429 11/25/16 1015   11/11/16 2000  ciprofloxacin (CIPRO) tablet 750 mg     750 mg Per Tube 2 times daily 11/11/16 0957 11/11/16 2010   11/20/2016 1600  ciprofloxacin (CIPRO) tablet 750 mg  Status:  Discontinued     750 mg Oral 2 times daily 11/29/2016 1331 11/11/16 0957   11/02/16 1400  cefTRIAXone (ROCEPHIN) 1 g in dextrose 5 % 50 mL IVPB  Status:  Discontinued     1 g 100 mL/hr over 30 Minutes Intravenous Every 24 hours 11/02/16 1254 11/27/2016 1328   11/01/16 1000  vancomycin (VANCOCIN) IVPB 1000 mg/200 mL premix  Status:  Discontinued     1,000 mg 200 mL/hr over 60 Minutes Intravenous  Every 12 hours 11/01/16 0818 11/03/16 1026   10/31/16 1600  vancomycin (VANCOCIN) IVPB 750 mg/150 ml premix  Status:  Discontinued     750 mg 150 mL/hr over 60 Minutes Intravenous Every 24 hours 10/26/2016 1624 11/01/16 0818   10/31/16 0000  piperacillin-tazobactam (ZOSYN) IVPB 3.375 g  Status:  Discontinued     3.375 g 12.5 mL/hr over 240 Minutes Intravenous Every 8 hours 10/13/2016 1623 11/02/16 1254   10/29/2016 1600  piperacillin-tazobactam (ZOSYN) IVPB 3.375 g     3.375 g 100 mL/hr over 30 Minutes Intravenous  Once 10/14/2016 1553 10/28/2016 1637   10/17/2016 1600  vancomycin (VANCOCIN) IVPB 1000 mg/200 mL premix     1,000 mg 200 mL/hr over 60 Minutes Intravenous  Once 10/18/2016 1553 10/17/2016 1731        Objective:   Vitals:   12/22/16 0936 12/22/16 1246 12/22/16 1428 12/22/16 1700  BP:   (!) 96/55   Pulse: 84 (!) 108  (!) 109  Resp: 20 (!) 26 (!) 22 (!) 24  Temp:   98.9 F (37.2 C)   TempSrc:   Rectal   SpO2: 93% 90% 90% 90%  Weight:      Height:        Wt Readings from Last 3 Encounters:  12/18/16 60.9 kg (134 lb 4.3 oz)  08/10/16 64.7 kg (142 lb 10.2 oz)  07/11/16 60.3 kg (133 lb)     Intake/Output Summary (Last 24 hours) at 12/22/16 1747 Last data filed at 12/22/16 1500  Gross per 24 hour  Intake            698.4 ml  Output              550 ml  Net            148.4 ml     Physical Exam  Gen:- Resting peacefully Neck- trach with trach collar  Lungs-  CTAB  CV- S1, S2 normal Abd-  +ve B.Sounds, Abd Soft,   PEG tube site is clean Extremity/Skin:- No  edema,  Neuro- no purposeful cognitive,  sensory or motor response    Data Review:   Micro Results Recent Results (from the past 240 hour(s))  C difficile quick scan w PCR reflex     Status: Abnormal   Collection Time: 12/21/16  3:16 PM  Result Value Ref Range Status   C Diff antigen POSITIVE (A) NEGATIVE Final   C Diff toxin POSITIVE (A) NEGATIVE Final   C Diff interpretation Toxin producing C. difficile  detected.  Final    Comment: CRITICAL RESULT CALLED TO, READ BACK BY AND VERIFIED WITH: K.  Beckner RN 16:20 12/21/16 ([email or web address])     Radiology Reports Dg Chest Port 1 View  Result Date: 12/15/2016 CLINICAL DATA:  Hypoxia. EXAM: PORTABLE CHEST 1 VIEW COMPARISON:  12/09/2016 . FINDINGS: Tracheostomy tube and right PICC line stable position. Cardiomegaly with normal pulmonary vascularity. Bilateral pulmonary infiltrates, progressed from prior exam. Bilateral pleural effusions. Similar findings noted on prior exam. IMPRESSION: 1. Tracheostomy tube and right PICC line stable position. 2. Bilateral pulmonary infiltrates and bilateral pleural effusions noted. Pulmonary infiltrates have progressed from prior exam. Electronically Signed   By: Maisie Fus  Register   On: 12/15/2016 11:42   Dg Chest Port 1 View  Result Date: 12/09/2016 CLINICAL DATA:  Respiratory failure EXAM: PORTABLE CHEST 1 VIEW COMPARISON:  12/07/2016 FINDINGS: Cardiac shadow is stable. Tracheostomy tube and right-sided PICC line are again seen and stable. There is been interval removal of the left-sided chest tube without recurrent pneumothorax. Bilateral pleural effusions are again seen and stable. No new focal infiltrate is seen. IMPRESSION: Stable bilateral pleural effusions. Interval chest tube removal on the left without recurrent pneumothorax. Tubes and lines as described. Electronically Signed   By: Alcide Clever M.D.   On: 12/09/2016 07:53   Dg Chest Port 1 View  Result Date: 12/07/2016 CLINICAL DATA:  Pneumothorax. EXAM: PORTABLE CHEST 1 VIEW COMPARISON:  Radiograph May 05, 2017. FINDINGS: Stable cardiomediastinal silhouette. Endotracheal tube is unchanged in position. Right-sided PICC line is again noted and unchanged. Left-sided chest tube is unchanged. No pneumothorax is noted. Stable probable small loculated pleural effusion along left lateral chest wall. Probable layering effusion seen in right lung. Stable left upper lobe opacity  concerning for atelectasis or infiltrate. Bony thorax is unremarkable. IMPRESSION: Stable support apparatus. No pneumothorax is noted. Stable probable loculated left pleural effusion along left chest wall. Stable probable right pleural effusion. Stable left upper lobe opacity concerning for atelectasis or infiltrate. Electronically Signed   By: Lupita Raider, M.D.   On: 12/07/2016 07:42   Dg Chest Port 1 View  Result Date: 12/05/2016 CLINICAL DATA:  Followup exam for pleural effusion in possible pneumothorax. Left chest tube. EXAM: PORTABLE CHEST 1 VIEW COMPARISON:  11/23/2016 FINDINGS: Left chest tube is stable with its tip at the left apex. No pneumothorax. Airspace consolidation is noted on the left most apparent peripherally. There is a small residual left pleural effusion. Hazy airspace opacity has developed throughout most of the right lung. Probable small right pleural effusion. Right PICC and tracheostomy tube are stable and well positioned. IMPRESSION: 1. Worsened lung aeration on the right with new hazy airspace opacity throughout most of the right lung. Probable small right pleural effusion. 2. Persistent airspace opacities in the left lung with a small residual pleural effusion. 3. Stable chest tube.  No pneumothorax. Electronically Signed   By: Amie Portland M.D.   On: 12/05/2016 07:22   Dg Chest Port 1 View  Result Date: 11/23/2016 CLINICAL DATA:  Status post chest tube placement EXAM: PORTABLE CHEST 1 VIEW COMPARISON:  11/22/2016 FINDINGS: Recent chest tube placement has been performed. No pneumothorax is noted. Significant reduction in pleural fluid is seen. There is also significant improved aeration in the left lung. Persistent infiltrate is noted. Right PICC line and tracheostomy tube are again noted. IMPRESSION: Status post chest tube placement with significant re-expansion of the left lung and reduction in left pleural effusion. Electronically Signed   By: Alcide Clever M.D.   On:  11/23/2016 15:51     CBC No results for input(s):  WBC, HGB, HCT, PLT, MCV, MCH, MCHC, RDW, LYMPHSABS, MONOABS, EOSABS, BASOSABS, BANDABS in the last 168 hours.  Invalid input(s): NEUTRABS, BANDSABD  Chemistries   Recent Labs Lab 12/16/16 0535  NA 143  K 3.5  CL 102  CO2 29  GLUCOSE 89  BUN 18  CREATININE 0.80  CALCIUM 9.0  MG 2.0   ------------------------------------------------------------------------------------------------------------------ No results for input(s): CHOL, HDL, LDLCALC, TRIG, CHOLHDL, LDLDIRECT in the last 72 hours.  Lab Results  Component Value Date   HGBA1C 5.3 05/20/2016   ------------------------------------------------------------------------------------------------------------------ No results for input(s): TSH, T4TOTAL, T3FREE, THYROIDAB in the last 72 hours.  Invalid input(s): FREET3 ------------------------------------------------------------------------------------------------------------------ No results for input(s): VITAMINB12, FOLATE, FERRITIN, TIBC, IRON, RETICCTPCT in the last 72 hours.  Coagulation profile No results for input(s): INR, PROTIME in the last 168 hours.  No results for input(s): DDIMER in the last 72 hours.  Cardiac Enzymes No results for input(s): CKMB, TROPONINI, MYOGLOBIN in the last 168 hours.  Invalid input(s): CK ------------------------------------------------------------------------------------------------------------------    Component Value Date/Time   BNP 23.8 03/20/2016 1800     Sukhman Martine M.D on 12/22/2016 at 5:47 PM  Between 7am to 7pm - Pager - [phone]  After 7pm go to [email or web address] - password [email or web address]  [facility] -  Office  [phone]  Dragon dictation system was used to create this note, attempts have been made to correct errors, however presence of uncorrected errors is not a reflection quality of care provided

## 2016-12-22 NOTE — Progress Notes (Signed)
Patient's temperature was 90.5 rectally. Notified [facility] geologistchorr at [id number]. Schorr returned my call. Allowed Schorr to speak to charge nurse about the patient's situation.

## 2016-12-22 NOTE — Progress Notes (Signed)
Patient placed on warming blanket. 

## 2016-12-22 NOTE — Progress Notes (Signed)
Dr. Phillips OdorGolding with palliative care called and inquired about morphine drip and tube feeds. Informed MD morphine drip not started and still on tube feeds. Per MD discontinue tube feeds and start morphine drip.

## 2016-12-23 NOTE — Care Management Note (Signed)
Case Management Note  Patient Details  Name: Dale HollowCraig D Mitcheltree MRN: [id number] Date of Birth: 03/03/1966  Subjective/Objective:   Acute hypoxic resp failure, comfort care, DNR           Action/Plan: Discharge Planning: NCM contacted attending to discuss Residential Hospice. Contacted Palliative attending to discuss GIP vs Residential Hospice. Spoke to Dr Phillips OdorGolding and will discuss with family.   Expected Discharge Date:  11/11/16               Expected Discharge Plan:  Hospice Medical Facility  In-House Referral:  Clinical Social Work  Discharge planning Services  CM Consult  Post Acute Care Choice:  NA Choice offered to:  NA  DME Arranged:  N/A DME Agency:  NA  HH Arranged:  NA HH Agency:  NA  Status of Service:  Completed, signed off  If discussed at [employer] Length of Stay Meetings, dates discussed:    Additional Comments:  Elliot CousinShavis, Guillermo Difrancesco Ellen, RN 12/23/2016, 5:38 PM

## 2016-12-23 NOTE — Progress Notes (Signed)
Pt placed on 21% ATC per MD order. RT will continue to monitor.

## 2016-12-23 NOTE — Progress Notes (Signed)
Patient Demographics:    Dale [name], is a 51 y.o. male, DOB - 06/08/1966, OIN:[id number]  Admit date - 10/07/2016   Admitting Physician Rush Landmark, MD  Outpatient Primary MD for the patient is No PCP Per Patient  LOS - 62   Chief Complaint  Patient presents with  . Diarrhea        Subjective:    Dale [name] today has no fevers, no emesis,  Appears comfortable   Assessment  & Plan :    Active Problems:   Tracheostomy status (HCC)   Adrenal insufficiency (Addison's disease) (HCC)   Respiratory failure (HCC)   Renal failure   Pressure injury of skin   Acute respiratory failure with hypoxia (HCC)   ARDS (adult respiratory distress syndrome) (HCC)   Severe sepsis (HCC)   Chronic anoxic encephalopathy (HCC)   Systolic and diastolic CHF, chronic (HCC)   Mucus plugging of bronchi   Abdominal distension   Collapse of left lung   Encephalopathy chronic   Adrenal insufficiency (HCC)   Severe malnutrition (HCC)   Chest tube in place   Sepsis due to pneumonia ([place])   Severe protein-calorie malnutrition ([place])   Enteritis due to Clostridium difficile   Brief Narrative: 51 y.o.BM with PMHxCerebral septic emboli ; Cerebral thrombosis with cerebral infarction (03/25/2016), Convulsions/seizures (05/04/2016); Dementia (04/27/2016); Depression; Dysphagia (05/04/2016); ETOH abuse;Tobacco abuse , Hydrocephalus Acute encephalopathy;Meningitis, Acute ischemic stroke,Cardiac arrest; Cardiogenic shock, NSTEMI, Chronic Diastolic and Systolic CHF.  Admission in March 2017with strep pneumo meningitis c/b cardiac arrest and cardiogenic shock, ARDS requiring prolonged intubation and trach (now decannulated), and acute renal failure. He was admitted to [facility] 7/13/17through8/7/17 for recurrent hypoglycemia due to adrenal insufficiency, new subacute CVA, seizures with inability to protect airway s/p  intubation 7/13 through 7/21. He re-presented8/08 again with sepsis from UTI, and was intubated due to AMS, and wastrached 8/18, and was discharged with a cuffless trach. That hospitalization was c/b MRSA bacteremia 8/24, and completed 3 weeks of IV vanc. He also developed C. Diff, and was treated with PO vanc through 9/11. He has had continued autonomic dysreflexia, which worsens during complications of acute illnesses.  Prior to this presentation, he had been well, although he was decannulated for reasons that are unclear. He was brought to the ED with decreased level of consciousness and nausea/vomiting/diarrhea. A CXR showed consolidation of the left upper lobe. He was intubated and transferred to [facility]. He was stabilized. Taken off of the ventilator.  There have been significant amount of conversations with his wife with regard to goals of care. Subsequently made DO NOT RESUSCITATE. Comfort is the main goal at this time.  1)C diff Colitis- c/n vancomycin sol via peg tube  2)Acute hypoxic respiratory failure secondary to Enterobacter HCAP w/ ARDS and Sepsis Status post tracheostomy for the third time - trach care per PCCM with no plans to decannulate Patient noted to be more tachypneic 2 days ago and noted to have increase in oxygen requirements. Chest x-ray was done which shows worsening bilateral infiltrates. Patient is aspirating as he has done before. Discussed with Dr. Hilma Favors who has spoken extensively to patient's wife. No escalation of care. Continue to support with oxygen. Morphine as needed. Patient has had a very  poor quality of life for a long time. There is no chance of meaningful recovery. Comfort care is the main goal. No vent support. -Left chest tube discontinued on 1/4 -1/9 #6 cuffless trach placed. Per pulmonology, eventually he can go down to #4 cuff less. But he cannot be decannulated.  3)Enterobacter cloacae left lung pneumonia w/ extensive infiltrate and atx of L  lung -Has completed a full course of antibiotic therapy  -Coag neg Staph in 1 of 2 blood cx , Most c/w contaminant Patient with recurrent aspiration. This is will be an ongoing issue for him considering his overall medical condition. No antibiotics at this time.  Comfort care is the main goal.  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 Continue Keppra.  5)Transient Atrial fibrillation- Currently NSR. Continue amiodarone. focus is on comfort.  6)Chronic Systolic and Diastolic CHF- EF 26% Aug 3785 ,  no evidence of volume overload at this time    7)Adrenal insufficiency -  Autonomic dysreflexia, Patient was on Florinef and Hydrocortisone. Stopped for comfort care.  8)FEN/Hypernatremia/Hypokalemia/Hypomagnesemia- As per   palliative care physician (Dr Hilma Favors) stopped tube feeding and start IV morphine sulfate for comfort   9)Anemia- avoid transfusionsTransfuse for hemoglobin<8 -1/9transfused 1 unit. PRBC    Goals of care Patient now DO NOT RESUSCITATE. No escalation of care. Prognosis is poor. Comfort care is the main goal now. Patient has been stable for the last few days. Wonder if hospice at skilled nursing facility is a possibility for him if he remains stable. As per Dr. Hilma Favors who has been primarily communicating with patient's wife, okay to stop PEG tube feeding and start IV morphine sulfate for comfort   DVT prophylaxis:Comfort care Code Status:DNR Family Communication:No family at bedside Disposition Plan:Comfort care.  IV morphine for comfort, Dr Hilma Favors to discuss possibility of transfer to residential hospice with family,  Comfort care is the main goal.  Consultants: PCCM. PMT  Procedures/Significant Events: 11/26 [facility]ubated [facility]otracheally) at [facility] - [facility]  11/29 bronch - left inspissated  pus  Code Status:DNR  Disposition Plan:?? Hospice  Lab Results  Component Value Date   PLT 380 12/15/2016    Inpatient Medications  Scheduled Meds: . amiodarone  200 mg Oral BID  . chlorhexidine gluconate (MEDLINE KIT)  15 mL Mouth Rinse BID  . clonazePAM  0.5 mg Per Tube Q6H  . famotidine  20 mg Per Tube BID  . levETIRAcetam  1,500 mg Per Tube BID  . mouth rinse  15 mL Mouth Rinse QID  . QUEtiapine  50 mg Per Tube BID  . sodium chloride flush  10-20 mL Intravenous Q12H  . Valproate Sodium  750 mg Per Tube Q8H  . vancomycin  125 mg Oral QID   Continuous Infusions: . morphine 1 mg/hr (12/22/16 0841)   PRN Meds:.acetaminophen, diphenhydrAMINE, fentaNYL (SUBLIMAZE) injection, glycopyrrolate **OR** glycopyrrolate **OR** glycopyrrolate, haloperidol **OR** haloperidol **OR** haloperidol lactate, levalbuterol, LORazepam, morphine injection, morphine, ondansetron **OR** ondansetron (ZOFRAN) IV, polyvinyl alcohol, sodium chloride flush, sodium chloride flush    Anti-infectives    Start     Dose/Rate Route Frequency Ordered Stop   12/21/16 1845  vancomycin (VANCOCIN) 50 mg/mL oral solution 125 mg     125 mg Oral 4 times daily 12/21/16 1833 01/04/17 1759   11/25/16 1500  cefTRIAXone (ROCEPHIN) 1 g in dextrose 5 % 50 mL IVPB  1 g 100 mL/hr over 30 Minutes Intravenous Every 24 hours 11/25/16 1015 11/25/16 1724   11/18/16 1500  cefTRIAXone (ROCEPHIN) 1 g in dextrose 5 % 50 mL IVPB  Status:  Discontinued     1 g 100 mL/hr over 30 Minutes Intravenous Every 24 hours 11/18/16 1429 11/25/16 1015   11/11/16 2000  ciprofloxacin (CIPRO) tablet 750 mg     750 mg Per Tube 2 times daily 11/11/16 0957 11/11/16 2010   11/13/2016 1600  ciprofloxacin (CIPRO) tablet 750 mg  Status:  Discontinued     750 mg Oral 2 times daily 11/12/2016 1331 11/11/16 0957   11/02/16 1400  cefTRIAXone (ROCEPHIN) 1 g in dextrose 5 % 50 mL IVPB  Status:  Discontinued     1 g 100 mL/hr over 30 Minutes Intravenous  Every 24 hours 11/02/16 1254 11/25/2016 1328   11/01/16 1000  vancomycin (VANCOCIN) IVPB 1000 mg/200 mL premix  Status:  Discontinued     1,000 mg 200 mL/hr over 60 Minutes Intravenous Every 12 hours 11/01/16 0818 11/03/16 1026   10/31/16 1600  vancomycin (VANCOCIN) IVPB 750 mg/150 ml premix  Status:  Discontinued     750 mg 150 mL/hr over 60 Minutes Intravenous Every 24 hours 10/25/2016 1624 11/01/16 0818   10/31/16 0000  piperacillin-tazobactam (ZOSYN) IVPB 3.375 g  Status:  Discontinued     3.375 g 12.5 mL/hr over 240 Minutes Intravenous Every 8 hours 10/06/2016 1623 11/02/16 1254   10/15/2016 1600  piperacillin-tazobactam (ZOSYN) IVPB 3.375 g     3.375 g 100 mL/hr over 30 Minutes Intravenous  Once 10/22/2016 1553 10/19/2016 1637   10/10/2016 1600  vancomycin (VANCOCIN) IVPB 1000 mg/200 mL premix     1,000 mg 200 mL/hr over 60 Minutes Intravenous  Once 10/20/2016 1553 10/11/2016 1731        Objective:   Vitals:   12/23/16 0431 12/23/16 0954 12/23/16 1237 12/23/16 1445  BP: (!) 74/49     Pulse: (!) 120 90 90 90  Resp: (!) '21 20 18 16  '$ Temp: (!) 101.9 F (38.8 C)     TempSrc: Rectal     SpO2: 93% 95% 93% 91%  Weight:      Height:        Wt Readings from Last 3 Encounters:  12/18/16 60.9 kg (134 lb 4.3 oz)  08/10/16 64.7 kg (142 lb 10.2 oz)  07/11/16 60.3 kg (133 lb)     Intake/Output Summary (Last 24 hours) at 12/23/16 1833 Last data filed at 12/23/16 0600  Gross per 24 hour  Intake              972 ml  Output              510 ml  Net              462 ml     Physical Exam  Gen:- Appears comfortable HEENT:- [place].AT, No sclera icterus Neck- trach with trach collar Lungs-  CTAB  CV- S1, S2 normal Abd-  +ve B.Sounds, Abd Soft, PEG tube Extremity/Skin:- No  edema,  , right-sided hemiplegia    Data Review:   Micro Results Recent Results (from the past 240 hour(s))  C difficile quick scan w PCR reflex     Status: Abnormal   Collection Time: 12/21/16  3:16 PM  Result Value Ref  Range Status   C Diff antigen POSITIVE (A) NEGATIVE Final   C Diff toxin POSITIVE (A) NEGATIVE Final   C Diff  interpretation Toxin producing C. difficile detected.  Final    Comment: CRITICAL RESULT CALLED TO, READ BACK BY AND VERIFIED WITH: Larena Glassman RN 16:20 12/21/16 ([email or web address])     Radiology Reports Dg Chest Port 1 View  Result Date: 12/15/2016 CLINICAL DATA:  Hypoxia. EXAM: PORTABLE CHEST 1 VIEW COMPARISON:  12/09/2016 . FINDINGS: Tracheostomy tube and right PICC line stable position. Cardiomegaly with normal pulmonary vascularity. Bilateral pulmonary infiltrates, progressed from prior exam. Bilateral pleural effusions. Similar findings noted on prior exam. IMPRESSION: 1. Tracheostomy tube and right PICC line stable position. 2. Bilateral pulmonary infiltrates and bilateral pleural effusions noted. Pulmonary infiltrates have progressed from prior exam. Electronically Signed   By: [place]   On: 12/15/2016 11:42   Dg Chest Port 1 View  Result Date: 12/09/2016 CLINICAL DATA:  Respiratory failure EXAM: PORTABLE CHEST 1 VIEW COMPARISON:  12/07/2016 FINDINGS: Cardiac shadow is stable. Tracheostomy tube and right-sided PICC line are again seen and stable. There is been interval removal of the left-sided chest tube without recurrent pneumothorax. Bilateral pleural effusions are again seen and stable. No new focal infiltrate is seen. IMPRESSION: Stable bilateral pleural effusions. Interval chest tube removal on the left without recurrent pneumothorax. Tubes and lines as described. Electronically Signed   By: Inez Catalina M.D.   On: 12/09/2016 07:53   Dg Chest Port 1 View  Result Date: 12/07/2016 CLINICAL DATA:  Pneumothorax. EXAM: PORTABLE CHEST 1 VIEW COMPARISON:  Radiograph May 05, 2017. FINDINGS: Stable cardiomediastinal silhouette. Endotracheal tube is unchanged in position. Right-sided PICC line is again noted and unchanged. Left-sided chest tube is unchanged. No pneumothorax is noted.  Stable probable small loculated pleural effusion along left lateral chest wall. Probable layering effusion seen in right lung. Stable left upper lobe opacity concerning for atelectasis or infiltrate. Bony thorax is unremarkable. IMPRESSION: Stable support apparatus. No pneumothorax is noted. Stable probable loculated left pleural effusion along left chest wall. Stable probable right pleural effusion. Stable left upper lobe opacity concerning for atelectasis or infiltrate. Electronically Signed   By: Marijo Conception, M.D.   On: 12/07/2016 07:42   Dg Chest Port 1 View  Result Date: 12/05/2016 CLINICAL DATA:  Followup exam for pleural effusion in possible pneumothorax. Left chest tube. EXAM: PORTABLE CHEST 1 VIEW COMPARISON:  11/23/2016 FINDINGS: Left chest tube is stable with its tip at the left apex. No pneumothorax. Airspace consolidation is noted on the left most apparent peripherally. There is a small residual left pleural effusion. Hazy airspace opacity has developed throughout most of the right lung. Probable small right pleural effusion. Right PICC and tracheostomy tube are stable and well positioned. IMPRESSION: 1. Worsened lung aeration on the right with new hazy airspace opacity throughout most of the right lung. Probable small right pleural effusion. 2. Persistent airspace opacities in the left lung with a small residual pleural effusion. 3. Stable chest tube.  No pneumothorax. Electronically Signed   By: Lajean Manes M.D.   On: 12/05/2016 07:22     CBC No results for input(s): WBC, HGB, HCT, PLT, MCV, MCH, MCHC, RDW, LYMPHSABS, MONOABS, EOSABS, BASOSABS, BANDABS in the last 168 hours.  Invalid input(s): NEUTRABS, BANDSABD  Chemistries  No results for input(s): NA, K, CL, CO2, GLUCOSE, BUN, CREATININE, CALCIUM, MG, AST, ALT, ALKPHOS, BILITOT in the last 168 hours.  Invalid input(s):  GFRCGP ------------------------------------------------------------------------------------------------------------------ No results for input(s): CHOL, HDL, LDLCALC, TRIG, CHOLHDL, LDLDIRECT in the last 72 hours.  Lab Results  Component Value Date   HGBA1C 5.3  05/20/2016   ------------------------------------------------------------------------------------------------------------------ No results for input(s): TSH, T4TOTAL, T3FREE, THYROIDAB in the last 72 hours.  Invalid input(s): FREET3 ------------------------------------------------------------------------------------------------------------------ No results for input(s): VITAMINB12, FOLATE, FERRITIN, TIBC, IRON, RETICCTPCT in the last 72 hours.  Coagulation profile No results for input(s): INR, PROTIME in the last 168 hours.  No results for input(s): DDIMER in the last 72 hours.  Cardiac Enzymes No results for input(s): CKMB, TROPONINI, MYOGLOBIN in the last 168 hours.  Invalid input(s): CK ------------------------------------------------------------------------------------------------------------------    Component Value Date/Time   BNP 23.8 03/20/2016 1800     Corran Lalone M.D on 12/23/2016 at 6:33 PM  Between 7am to 7pm - Pager - [phone]  After 7pm go to [email or web address] - password [email or web address]  [facility] -  Office  [phone]  Dragon dictation system was used to create this note, attempts have been made to correct errors, however presence of uncorrected errors is not a reflection quality of care provided

## 2016-12-23 NOTE — Progress Notes (Signed)
[name] appears to have turned a corner today in terms of his condition. He is febrile 101.9. Copious amounts of respiratory secretions, had tube feeding in his trach aspirate. He is extremely hypotensive 74/48. He is currently on 100% FiO2, which will be changed to humidified air for his comfort and to not prolong his suffering. Noted C. Diff positive. At this point he is not stable for transport and I expect death within 24 hours. Should he stabilize we can determine if hospice facility level is appropriate. If he begins to need more complex symptom management for his comfort and he looks like he will have >24 hours we an explore Hospital GIP. Today he is not appropriate for SNF level care based on the changes described above. I have notified his family of his change.  Anderson MaltaElizabeth Demone Lyles, DO Palliative Medicine

## 2017-01-05 NOTE — Progress Notes (Signed)
Attempted to call wife's mobile and home number but still no answer.

## 2017-01-05 NOTE — Discharge Summary (Signed)
Dale [name], is a 51 y.o. male  DOB [birth year]  MRN [id number].  Admission date:  20-Nov-2016  Admitting Physician  Louann Sjogren, MD  Discharge Date:  12/22/2016   Primary MD  No PCP Per Patient  Recommendations for primary care physician for things to follow:   Pt  expired before 4 AM 12/10/2016, patient was neither seen nor evaluated by me on 12/14/2016 as he had already expired prior to resumption of my shift.   Admission Diagnosis  Acute kidney injury (HCC) [N17.9] HCAP (healthcare-associated pneumonia) [J18.9] Severe sepsis (HCC) [A41.9, R65.20] Acute on chronic respiratory failure with hypoxia (HCC) [J96.21]   Discharge Diagnosis  Acute kidney injury (HCC) [N17.9] HCAP (healthcare-associated pneumonia) [J18.9] Severe sepsis (HCC) [A41.9, R65.20] Acute on chronic respiratory failure with hypoxia (HCC) [J96.21]    Active Problems:   Tracheostomy status (HCC)   Adrenal insufficiency (Addison's disease) (HCC)   Respiratory failure (HCC)   Renal failure   Pressure injury of skin   Acute respiratory failure with hypoxia (HCC)   ARDS (adult respiratory distress syndrome) (HCC)   Severe sepsis (HCC)   Chronic anoxic encephalopathy (HCC)   Systolic and diastolic CHF, chronic (HCC)   Mucus plugging of bronchi   Abdominal distension   Collapse of left lung   Encephalopathy chronic   Adrenal insufficiency (HCC)   Severe malnutrition (HCC)   Chest tube in place   Sepsis due to pneumonia (HCC)   Severe protein-calorie malnutrition (HCC)   Enteritis due to Clostridium difficile      Past Medical History:  Diagnosis Date  . Acute encephalopathy   . Acute ischemic stroke (HCC)   . Acute renal failure (HCC)   . Acute respiratory failure with hypoxia (HCC)   . AKI (acute kidney injury) (HCC) 02/16/2016  . Altered mental status   . Cardiac arrest (HCC)   . Cardiogenic shock (HCC)   .  Cerebral septic emboli (HCC)   . Cerebral thrombosis with cerebral infarction 03/25/2016  . CHF (congestive heart failure) (HCC)   . Convulsions/seizures (HCC) 05/04/2016   Seizure disorder - Continue Keppra, Depakote.     . Dementia 04/27/2016  . Depression   . Dysphagia 05/04/2016   Dysphagia - Dysphagia 1 diet per SLP     . ETOH abuse   . Hydrocephalus   . Hyperlipidemia 05/04/2016   Dyslipidemia - Continue statin.     Marland Kitchen Hypernatremia   . Hypothermia 03/20/2016  . Low serum cortisol level (HCC) 05/04/2016   Low a.m. cortisol  - Was 1.2 checked on 04/03/2016  - This likely is reflective of cortisol suppression from steroid use - Tapered steroids off   . Meningitis, pneumococcal, recurrent   . Meningitis, streptococcal 02/16/2016  . Meningoencephalitis   . NSTEMI (non-ST elevated myocardial infarction) (HCC) 03/14/2016  . Protein-calorie malnutrition, severe (HCC)   . Respiratory failure (HCC) 03/03/2016  . Septic shock (HCC)   . Severe sepsis (HCC)   . 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 Substance abuse   .  Systolic and diastolic CHF, chronic (HCC)   . Tobacco abuse 02/16/2016  . 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 Urinary retention 05/04/2016   Urinary retention - Continue Flomax       Past Surgical History:  Procedure Laterality Date  . ANKLE SURGERY    . CARDIAC CATHETERIZATION N/A 02/17/2016   Procedure: IABP Insertion;  Surgeon: Iran OuchMuhammad A Arida, MD;  Location: [facility] INVASIVE [facility];  Service: Cardiovascular;  Laterality: N/A;  . IR GENERIC HISTORICAL  08/01/2016   IR GASTROSTOMY TUBE MOD SED 08/01/2016 Irish LackGlenn Yamagata, MD [facility]-INTER[facility] RAD  . TEE WITHOUT CARDIOVERSION N/A 03/29/2016   Procedure: TRANSESOPHAGEAL ECHOCARDIOGRAM (TEE);  Surgeon: Laurey Moralealton S McLean, MD;  [facility];  Service: Cardiovascular;  Laterality: N/A;  . TRACHEOSTOMY TUBE PLACEMENT N/A 12/03/2016   Procedure: TRACHEOSTOMY;  Surgeon: Osborn Cohoavid Shoemaker, MD;  [facility];  Service: ENT;  Laterality:  N/A;       HPI  from the history and physical done on the day of admission:   Pt  expired before 4 AM 12/10/2016, patient was neither seen nor evaluated by me on 12/11/2016 as he had already expired prior to resumption of my shift.    HISTORY OF PRESENT ILLNESS:   Dale [name] is non-verbal, and history is obtained from the chart. Brief history from Dr. Kendrick FriesMcQuaid 07/12/16: Dale [name] is an 51 y.o. M with PMH as outlined below including admission in March 2017 with strep pneumo meningitis c/b cardiac arrest and cardiogenic shock, ARDS requiring prolonged intubation and trach (now decannulated), and acute renal failure. [facility] 07[facility]11[facility]16 for recurrent hypoglycemia due to adrenal insufficiency, new subacute CVA, seizures with inability to protect airway s/p intubation 7/13 through 7/21. He re-presented 8/08 again with sepsis from UTI, and was intubated due to AMS, and was trached 8/18, and was discharged with a cuffless trach. That hospitalization was c/b MRSA bacteremia 8/24, and completed 3 weeks of IV vanc. He also developed C. Diff, and was treated with PO vanc through 9/11. He has had continued autonomic dysreflexia, which worsens during complications of acute illnesses.  Prior to this presentation, he had been well, although he was decannulated for reasons that are unclear. He was brought to the ED with decreased level of consciousness and nausea/vomiting/diarrhea. A CXR showed consolidation of the left upper lobe. [facility] intub[facility] and [facility].  There have been significant amount of conversations with his wife with regard to goals of care, but she has insisted on full support. He is full code.  PAST MEDICAL HISTORY :  He  has a past medical history of Acute encephalopathy; Acute ischemic stroke (HCC); Acute renal failure (HCC); Acute respiratory failure with hypoxia (HCC); AKI (acute kidney injury) (HCC) (02/16/2016); Altered mental status; Cardiac arrest  (HCC); Cardiogenic shock (HCC); Cerebral septic emboli (HCC); Cerebral thrombosis with cerebral infarction (03/25/2016); CHF (congestive heart failure) (HCC); Convulsions/seizures (HCC) (05/04/2016); Dementia (04/27/2016); Depression; Dysphagia (05/04/2016); ETOH abuse; Hydrocephalus; Hyperlipidemia (05/04/2016); Hypernatremia; Hypothermia (03/20/2016); Low serum cortisol level (HCC) (05/04/2016); Meningitis, pneumococcal, recurrent; Meningitis, streptococcal (02/16/2016); Meningoencephalitis; NSTEMI (non-ST elevated myocardial infarction) (HCC) (03/14/2016); Protein-calorie malnutrition, severe (HCC); Respiratory failure (HCC) (03/03/2016); Septic shock (HCC); Severe sepsis (HCC); Streptococcal meningitis (04/27/2016); Substance abuse; Systolic and diastolic CHF, chronic (HCC); Tobacco abuse (02/16/2016); Trichomonal urethritis in male (02/16/2016); and Urinary retention (05/04/2016).    Hospital Course:     Brief Narrative: [age] y.o.BM with PMHxCerebral septic emboli ; Cerebral thrombosis with cerebral infarction (03/25/2016), Convulsions/seizures (05/04/2016); Dementia (04/27/2016);  Depression; Dysphagia (05/04/2016); ETOH abuse;Tobacco abuse , Hydrocephalus Acute encephalopathy;Meningitis, Acute ischemic stroke,Cardiac arrest; Cardiogenic shock, NSTEMI, Chronic Diastolic and Systolic CHF. Pt  expired before 4 AM 01/03/2017, patient was neither seen nor evaluated by me on 12/30/2016 as he had already expired prior to resumption of my shift.   Admission in March 2017with strep pneumo meningitis c/b cardiac arrest and cardiogenic shock, ARDS requiring prolonged intubation and trach (now decannulated), and acute renal failure. He was admitted to [facility] 7/13/17through8/7/17 for recurrent hypoglycemia due to adrenal insufficiency, new subacute CVA, seizures with inability to protect airway s/p intubation 7/13 through 7/21. He re-presented8/08 again with sepsis from UTI, and was intubated due to AMS, and wastrached  8/18, and was discharged with a cuffless trach. That hospitalization was c/b MRSA bacteremia 8/24, and completed 3 weeks of IV vanc. He also developed C. Diff, and was treated with PO vanc through 9/11. He has had continued autonomic dysreflexia, which worsens during complications of acute illnesses.  Prior to this presentation, he had been well, although he was decannulated for reasons that are unclear. He was brought to the ED with decreased level of consciousness and nausea/vomiting/diarrhea. A CXR showed consolidation of the left upper lobe. He was intubated and transferred to [facility]. He was stabilized. Taken off of the ventilator.  There have been significant amount of conversations with his wife with regard to goals of care. Subsequently made DO NOT RESUSCITATE. Comfort is the main goal at this time.  Plan:-  1)C diff Colitis- treated with vancomycin sol via peg tube  2)Acute hypoxic respiratory failure secondary to Enterobacter HCAP w/ ARDS and Sepsis Status post tracheostomy for the third time - trach care per PCCM with no plans to decannulate Patient noted to be more tachypneic 2 days ago and noted to have increase in oxygen requirements. Chest x-ray was done which shows worsening bilateral infiltrates. Patient is aspirating as he has done before. Discussed with Dr. Phillips Odor who has spoken extensively to patient's wife. No escalation of care. Continue to support with oxygen. Morphine as needed. Patient has had a very poor quality of life for a long time. There is no chance of meaningful recovery. Comfort care is the main goal. No vent support. -Left chest tube discontinued on 1/4 -1/9 #6 cuffless trach placed. Per pulmonology, eventually he can go down to #4 cuff less. But he cannot be decannulated.... Pt  expired before 4 AM 12/16/2016, patient was neither seen nor evaluated by me on 12/21/2016 as he had already expired prior to resumption of my shift.   3)Enterobacter cloacae left lung  pneumonia w/ extensive infiltrate and atx of L lung -Has completed a full course of antibiotic therapy  -Coag neg Staph in 1 of 2 blood cx , Most c/w contaminant Patient with recurrent aspiration. This is will be an ongoing issue for him considering his overall medical condition. No antibiotics at this time.  Comfort care is the main goal.  4)Acute on chronic Encephalopathy -- 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, patient was nonverbal, no purposeful movement, no significant cognitive functions, Comfort care is the main goal. was on Keppra.  5)Transient Atrial fibrillation- Currently NSR. Continue amiodarone. focus is on comfort.  6)Chronic Systolic and Diastolic CHF- EF 16% Aug 2017 ,    7)Adrenal insufficiency -  Autonomic dysreflexia, Patient was on Florinef and Hydrocortisone. Stopped for comfort care.  8)FEN/Hypernatremia/Hypokalemia/Hypomagnesemia- As per  palliative care physician (Dr Phillips Odor) stopped tubefeeding andstart IV morphine sulfate for comfort   9)Anemia- avoid  transfusionsTransfuse for hemoglobin<8 -1/9transfused 1 unit. PRBC    Goals of care Patient now DO NOT RESUSCITATE. No escalation of care. Prognosis is poor. Comfort care is the main goal now. Patient has been stable for the last few days. Wonder if hospice at skilled nursing facility is a possibility for him if he remains stable. As perDr. Phillips Odor who has been primarily communicating with patient's wife, okay to stop PEG tube feeding and start IV morphine sulfate for comfort. Pt  expired before 4 AM 12/27/2016, patient was neither seen nor evaluated by me on 01/03/2017 as he had already expired prior to resumption of my shift.    DVT prophylaxis:Comfort care Code Status:DNR  Disposition Plan:Comfort care.  IV morphine for comfort, Dr Phillips Odor to discuss possibility of transfer to residential hospice with family,  Comfort care is the main goal. Pt  expired before 4 AM 12/15/2016, patient was  neither seen nor evaluated by me on 12/16/2016 as he had already expired prior to resumption of my shift.   Consultants: PCCM. PMT  Procedures/Significant Events: 11/26 [facility]racheally) at [facility] - [facility]  11/29 bronch - left inspissated pus  Code Status:DNR   Discharge Instructions    Pt  expired before 4 AM 12/10/2016, patient was neither seen nor evaluated by me on 01/01/2017 as he had already expired prior to resumption of my shift.    Discharge Medications     Allergies as of 12/07/2016   No Known Allergies     Medication List    STOP taking these medications   haloperidol lactate 5 MG/ML injection Commonly known as:  HALDOL   vancomycin 50 mg/mL oral solution Commonly known as:  VANCOCIN   Vancomycin 750-5 MG/150ML-% Soln Commonly known as:  VANCOCIN     TAKE these medications   acetaminophen 325 MG tablet Commonly known as:  TYLENOL Take 650 mg by mouth 2 (two) times daily.   albuterol (2.5 MG/3ML) 0.083% nebulizer solution Commonly known as:  PROVENTIL Take 3 mLs (2.5 mg total) by nebulization every 2 (two) hours as needed for wheezing.   amiodarone 200 MG tablet Commonly known as:  PACERONE Take 1 tablet (200 mg total) by mouth 2 (two) times daily.   atorvastatin 20 MG tablet Commonly known as:  LIPITOR Place 1 tablet (20 mg total) into feeding tube daily at 6 PM.   clonazePAM 0.5 MG tablet Commonly known as:  KLONOPIN Take 0.5 tablets (0.25 mg total) by mouth 4 (four) times daily. What changed:  how much to take   famotidine 40 MG/5ML suspension Commonly known as:  PEPCID Place 2.5 mLs (20 mg total) into feeding tube 2 (two) times daily.   feeding supplement (OSMOLITE 1.5 CAL) Liqd Place 1,000 mLs into feeding tube continuous. What changed:  Another medication with the same name was removed. Continue taking this medication, and follow the directions you see here.   fludrocortisone 0.1 MG tablet Commonly known  as:  FLORINEF Place 1 tablet (0.1 mg total) into feeding tube daily.   folic acid 1 MG tablet Commonly known as:  FOLVITE Place 1 tablet (1 mg total) into feeding tube daily.   free water Soln Place 200 mLs into feeding tube every 8 (eight) hours.   heparin 5000 UNIT/ML injection Inject 1 mL (5,000 Units total) into the skin every 8 (eight) hours.   hydrocortisone 10 MG tablet Commonly known as:  CORTEF Place 1 tablet (10 mg total) into feeding tube at bedtime. What changed:  You were already taking a medication with the same name, and this prescription was added. Make sure you understand how and when to take each.   hydrocortisone 5 MG tablet Commonly known as:  CORTEF Place 1 tablet (5 mg total) into feeding tube daily. What changed:  medication strength  how much to take  when to take this   ipratropium-albuterol 0.5-2.5 (3) MG/3ML Soln Commonly known as:  DUONEB Take 3 mLs by nebulization 3 (three) times daily.   levETIRAcetam 100 MG/ML solution Commonly known as:  KEPPRA Place 15 mLs (1,500 mg total) into feeding tube 2 (two) times daily.   LORazepam 1 MG tablet Commonly known as:  ATIVAN Place 1 tablet (1 mg total) into feeding tube every 6 (six) hours as needed for anxiety (extreme agitation).   metoprolol tartrate 25 MG tablet Commonly known as:  LOPRESSOR Place 1 tablet (25 mg total) into feeding tube 2 (two) times daily.   OXYGEN Inhale 2 L into the lungs daily.   QUEtiapine 50 MG tablet Commonly known as:  SEROQUEL Place 1 tablet (50 mg total) into feeding tube 2 (two) times daily. What changed:  how much to take   thiamine 100 MG tablet Place 1 tablet (100 mg total) into feeding tube daily.   Valproate Sodium 250 MG/5ML Soln solution Commonly known as:  DEPAKENE Place 15 mLs (750 mg total) into feeding tube every 8 (eight) hours.     ASK your doctor about these medications   aspirin 325 MG tablet Place 1 tablet (325 mg total) into feeding  tube daily.       Major procedures and Radiology Reports - PLEASE review detailed and final reports for all details, in brief -   Pt  expired before 4 AM 12/23/2016, patient was neither seen nor evaluated by me on 12/06/2016 as he had already expired prior to resumption of my shift.    Dg Chest Port 1 View  Result Date: 12/15/2016 CLINICAL DATA:  Hypoxia. EXAM: PORTABLE CHEST 1 VIEW COMPARISON:  12/09/2016 . FINDINGS: Tracheostomy tube and right PICC line stable position. Cardiomegaly with normal pulmonary vascularity. Bilateral pulmonary infiltrates, progressed from prior exam. Bilateral pleural effusions. Similar findings noted on prior exam. IMPRESSION: 1. Tracheostomy tube and right PICC line stable position. 2. Bilateral pulmonary infiltrates and bilateral pleural effusions noted. Pulmonary infiltrates have progressed from prior exam. Electronically Signed   By: Maisie Fus  Register   On: 12/15/2016 11:42   Dg Chest Port 1 View  Result Date: 12/09/2016 CLINICAL DATA:  Respiratory failure EXAM: PORTABLE CHEST 1 VIEW COMPARISON:  12/07/2016 FINDINGS: Cardiac shadow is stable. Tracheostomy tube and right-sided PICC line are again seen and stable. There is been interval removal of the left-sided chest tube without recurrent pneumothorax. Bilateral pleural effusions are again seen and stable. No new focal infiltrate is seen. IMPRESSION: Stable bilateral pleural effusions. Interval chest tube removal on the left without recurrent pneumothorax. Tubes and lines as described. Electronically Signed   By: Alcide Clever M.D.   On: 12/09/2016 07:53   Dg Chest Port 1 View  Result Date: 12/07/2016 CLINICAL DATA:  Pneumothorax. EXAM: PORTABLE CHEST 1 VIEW COMPARISON:  Radiograph May 05, 2017. FINDINGS: Stable cardiomediastinal silhouette. Endotracheal tube is unchanged in position. Right-sided PICC line is again noted and unchanged. Left-sided chest tube is unchanged. No pneumothorax is noted. Stable probable  small loculated pleural effusion along left lateral chest wall. Probable layering effusion seen in right lung. Stable left upper lobe opacity concerning for atelectasis  or infiltrate. Bony thorax is unremarkable. IMPRESSION: Stable support apparatus. No pneumothorax is noted. Stable probable loculated left pleural effusion along left chest wall. Stable probable right pleural effusion. Stable left upper lobe opacity concerning for atelectasis or infiltrate. Electronically Signed   By: Lupita Raider, M.D.   On: 12/07/2016 07:42   Dg Chest Port 1 View  Result Date: 12/05/2016 CLINICAL DATA:  Followup exam for pleural effusion in possible pneumothorax. Left chest tube. EXAM: PORTABLE CHEST 1 VIEW COMPARISON:  11/23/2016 FINDINGS: Left chest tube is stable with its tip at the left apex. No pneumothorax. Airspace consolidation is noted on the left most apparent peripherally. There is a small residual left pleural effusion. Hazy airspace opacity has developed throughout most of the right lung. Probable small right pleural effusion. Right PICC and tracheostomy tube are stable and well positioned. IMPRESSION: 1. Worsened lung aeration on the right with new hazy airspace opacity throughout most of the right lung. Probable small right pleural effusion. 2. Persistent airspace opacities in the left lung with a small residual pleural effusion. 3. Stable chest tube.  No pneumothorax. Electronically Signed   By: Amie Portland M.D.   On: 12/05/2016 07:22    Micro Results    Recent Results (from the past 240 hour(s))  C difficile quick scan w PCR reflex     Status: Abnormal   Collection Time: 12/21/16  3:16 PM  Result Value Ref Range Status   C Diff antigen POSITIVE (A) NEGATIVE Final   C Diff toxin POSITIVE (A) NEGATIVE Final   C Diff interpretation Toxin producing C. difficile detected.  Final    Comment: CRITICAL RESULT CALLED TO, READ BACK BY AND VERIFIED WITH: Boyd Kerbs RN 16:20 12/21/16 ([email or web address])         Today   Subjective    Dale [name]   Pt  expired before 4 AM 12/08/2016, patient was neither seen nor evaluated by me on 12/21/2016 as he had already expired prior to resumption of my shift.            Objective   Blood pressure (!) 74/49, pulse 87, temperature (!) 101.9 F (38.8 C), temperature source Rectal, resp. rate 16, height 5\' 8"  (1.727 m), weight 64.4 kg (142 lb), SpO2 (!) 86 %.  No intake or output data in the 24 hours ending 12/27/2016 1543  Pt  expired before 4 AM 12/29/2016, patient was neither seen nor evaluated by me on 12/23/2016 as he had already expired prior to resumption of my shift.   Data Review   CBC w Diff:  Lab Results  Component Value Date   WBC 11.9 (H) 12/15/2016   HGB 8.9 (L) 12/15/2016   HCT 28.4 (L) 12/15/2016   PLT 380 12/15/2016   LYMPHOPCT 12 11/11/2016   MONOPCT 5 11/11/2016   EOSPCT 0 11/11/2016   BASOPCT 0 11/11/2016    CMP:  Lab Results  Component Value Date   NA 143 12/16/2016   K 3.5 12/16/2016   CL 102 12/16/2016   CO2 29 12/16/2016   BUN 18 12/16/2016   CREATININE 0.80 12/16/2016   PROT 6.9 12/06/2016   ALBUMIN 1.8 (L) 12/06/2016   BILITOT 0.3 12/06/2016   ALKPHOS 54 12/06/2016   AST 20 12/06/2016   ALT 13 (L) 12/06/2016  .   Pt  expired before 4 AM 12/12/2016, patient was neither seen nor evaluated by me on 12/22/2016 as he had already expired prior to resumption of my shift.   Ilo Beamon,  Brynda Heick M.D on 01/10/2017 at 3:43 PM  [facility]   Office  [phone]  Dragon dictation system was used to create this note, attempts have been made to correct errors, however presence of uncorrected errors is not a reflection quality of care provided

## 2017-01-05 NOTE — Progress Notes (Signed)
Attempted to call wife on mobile and home phone but no answer.

## 2017-01-05 NOTE — Progress Notes (Signed)
Attempted to call wife Mickeal SkinnerShirley Jillson x2  But no answer, left message to mobile phone. Attempted to call brother Arloa Koharl Dawkins but no answer. Asked charge nurse to call  Wife and no answer too.

## 2017-01-05 NOTE — Progress Notes (Signed)
Patient's spouse called to let me know that family is on the way within the hour and to let them see patient before the body is moved.

## 2017-01-05 DEATH — deceased

## 2017-07-03 IMAGING — CR DG CHEST 2V
2 series · 2 of 2 positions shown · non-contrast
Comparison: Chest x-ray 06/01/2009.

CLINICAL DATA: 49-year-old male with history of anterior chest pain
for the past 2 days. Low-grade fever. Dizziness.

EXAM:
CHEST  2 VIEW

[w chest lat]
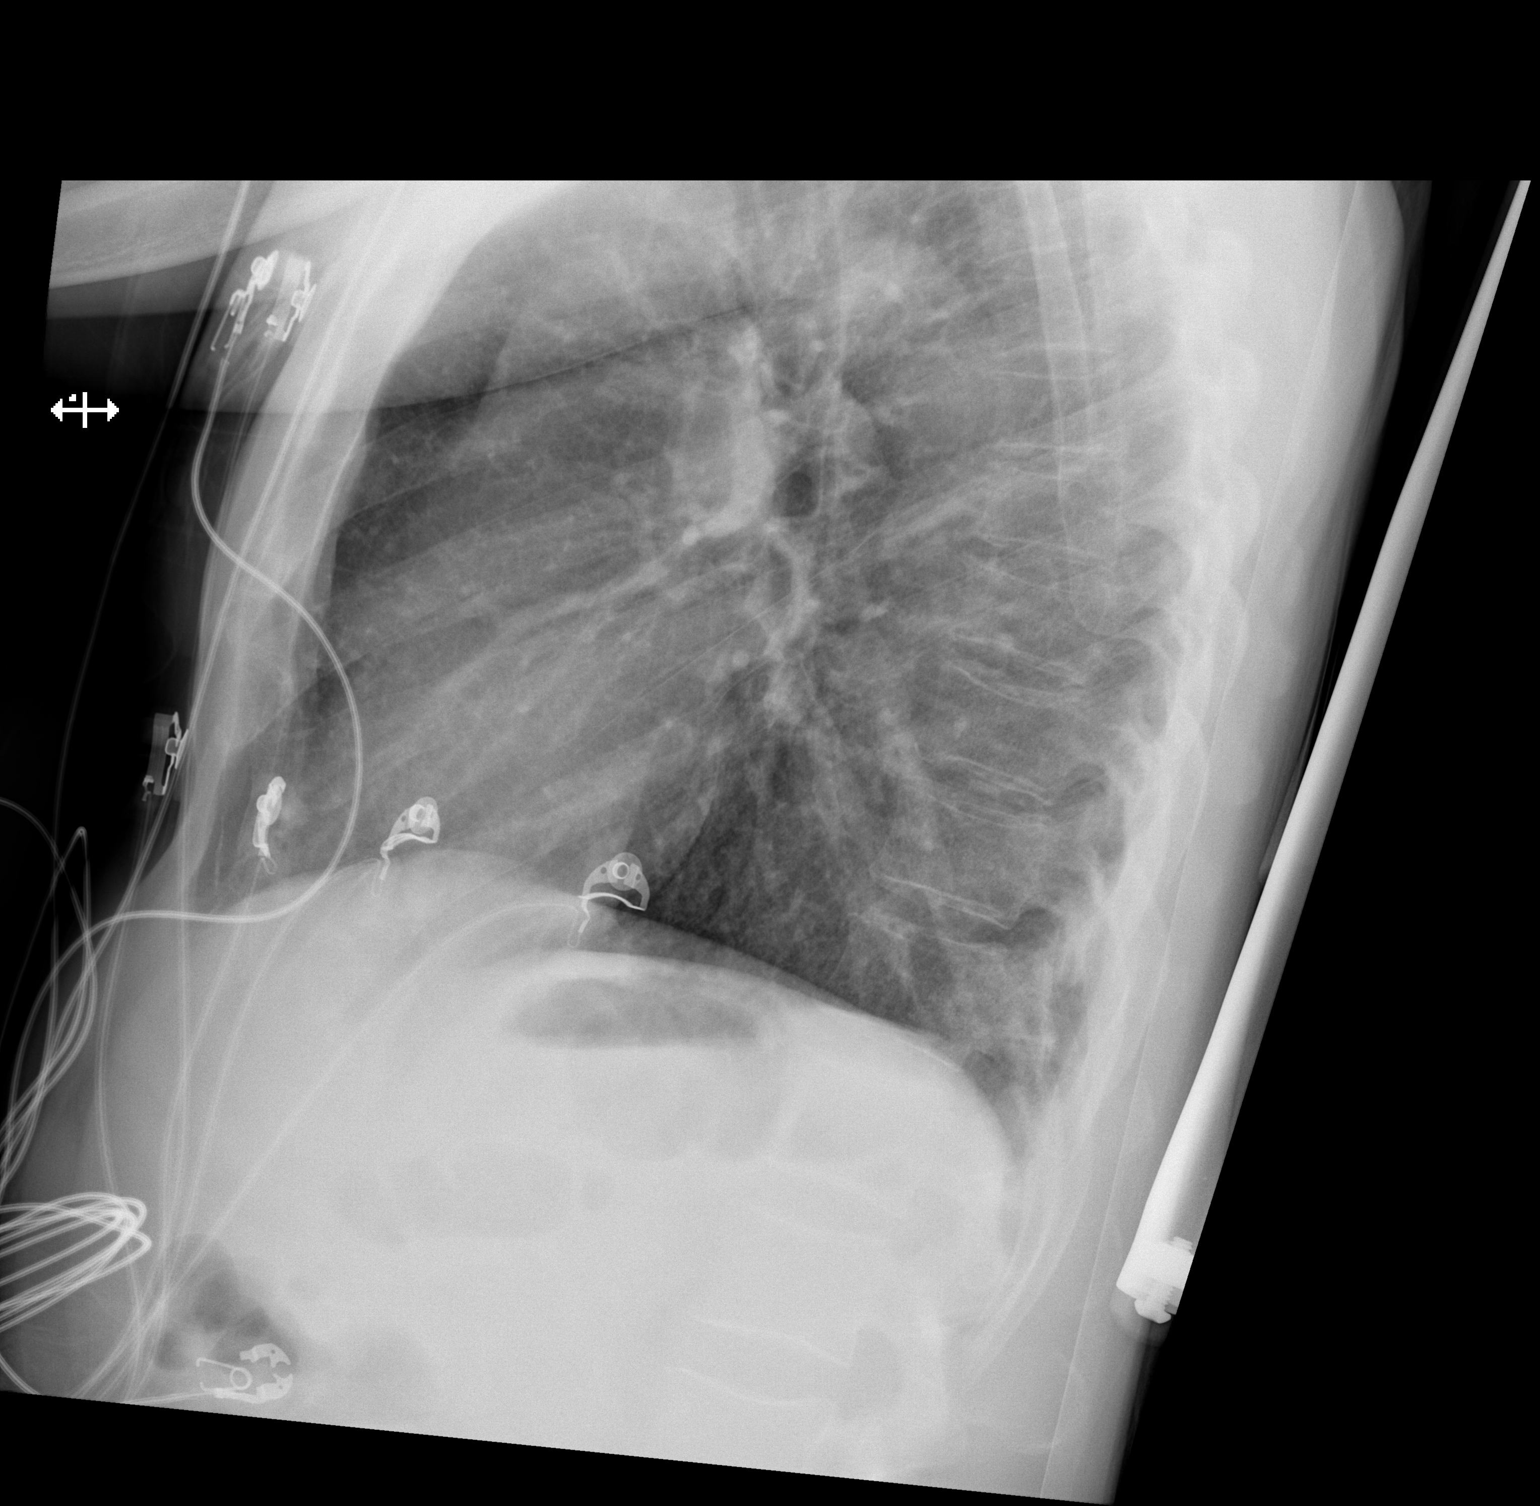

[x chest ap]
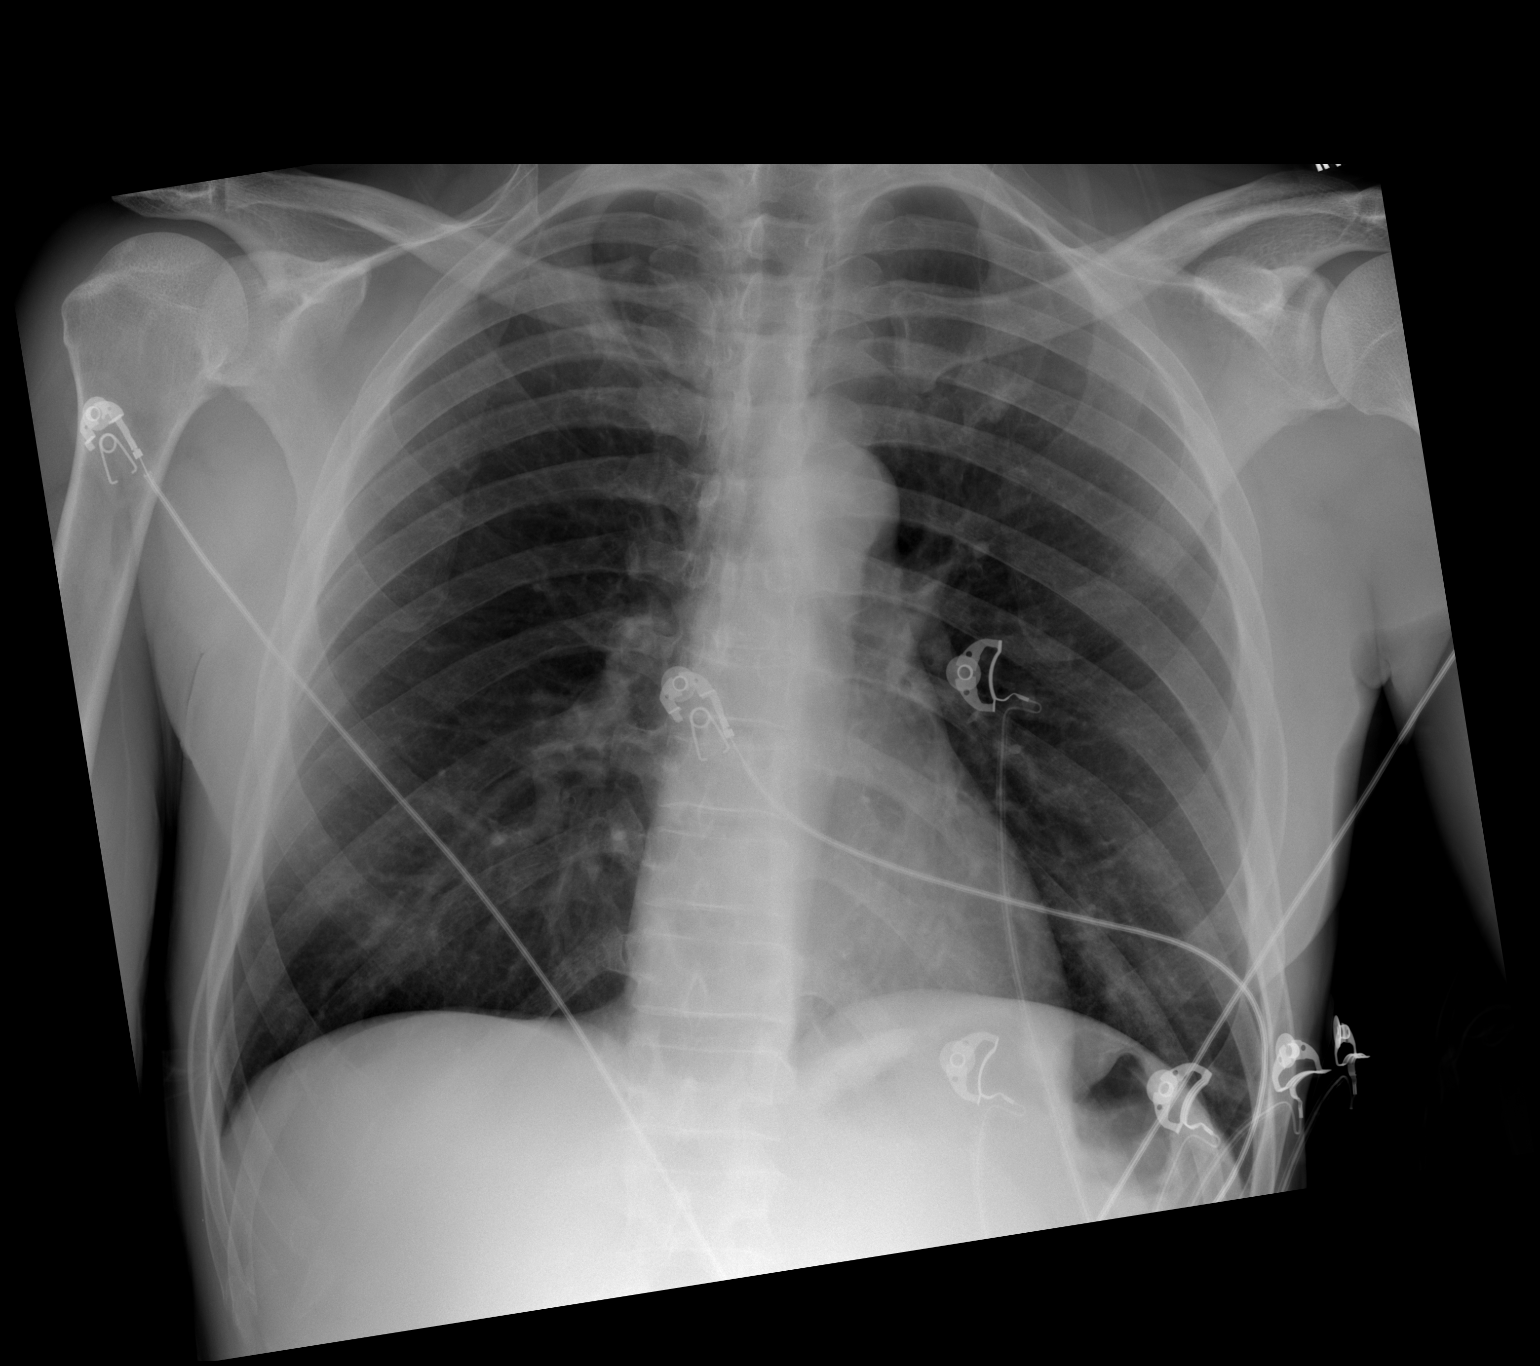

[2 of 2 positions shown; findings below may reference images not displayed]

FINDINGS: Lung volumes are normal. No consolidative airspace disease. No
pleural effusions. No pneumothorax. No pulmonary nodule or mass
noted. Pulmonary vasculature and the cardiomediastinal silhouette
are within normal limits.
IMPRESSION: No radiographic evidence of acute cardiopulmonary disease.

## 2017-07-03 IMAGING — CR DG LUMBAR SPINE COMPLETE 4+V
5 series · 5 of 5 positions shown · non-contrast
Comparison: 08/25/2014 lumbar spine radiographs.

CLINICAL DATA: Low back pain and recent fall.

EXAM:
LUMBAR SPINE - COMPLETE 4+ VIEW

[t lumbar spine ap]
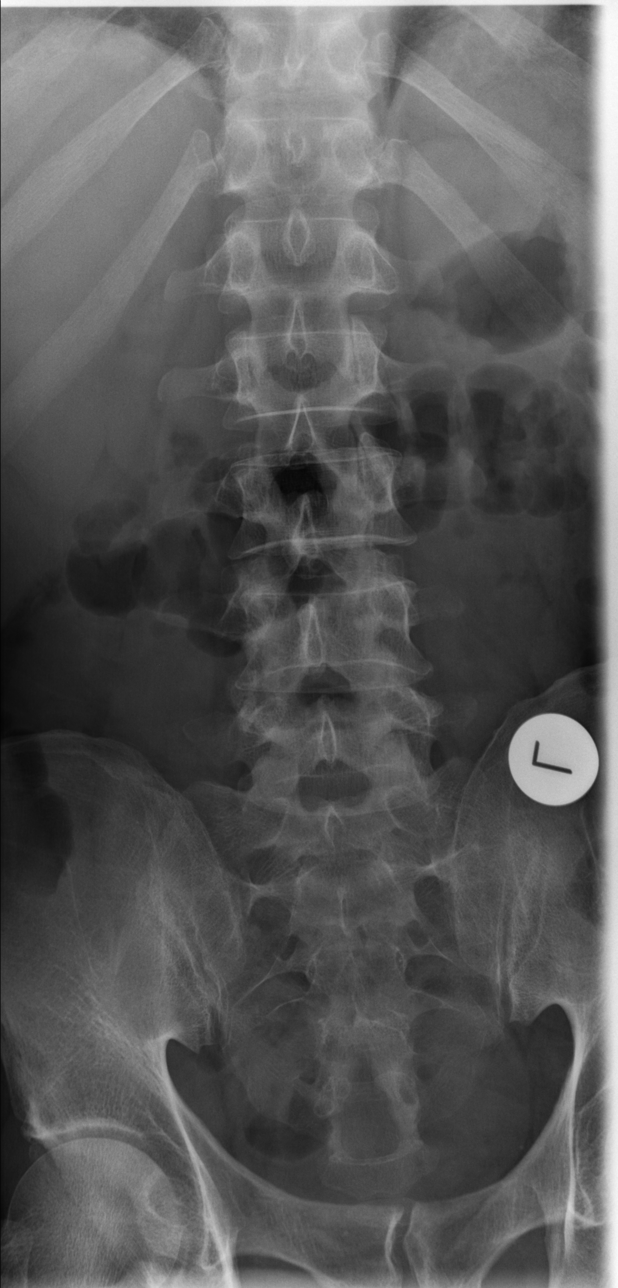

[t lumbar spine obl (1 of 2)]
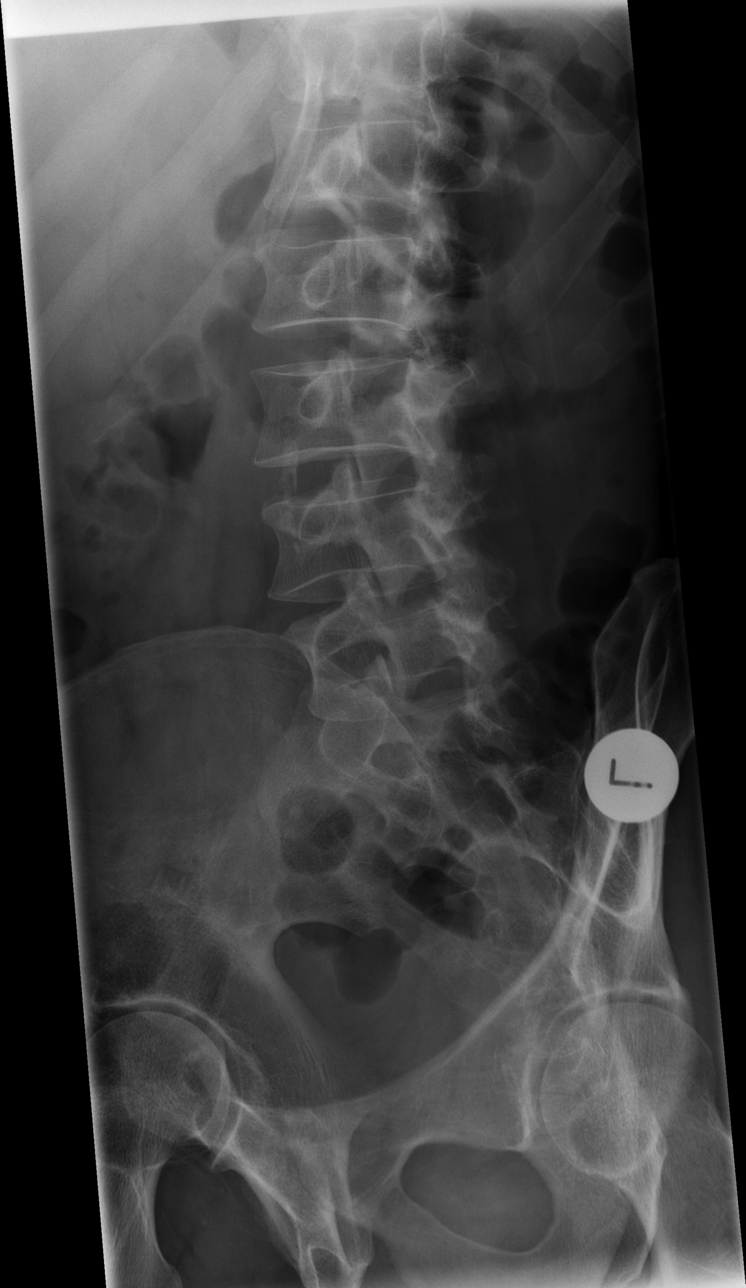

[t lumbar spine obl (2 of 2)]
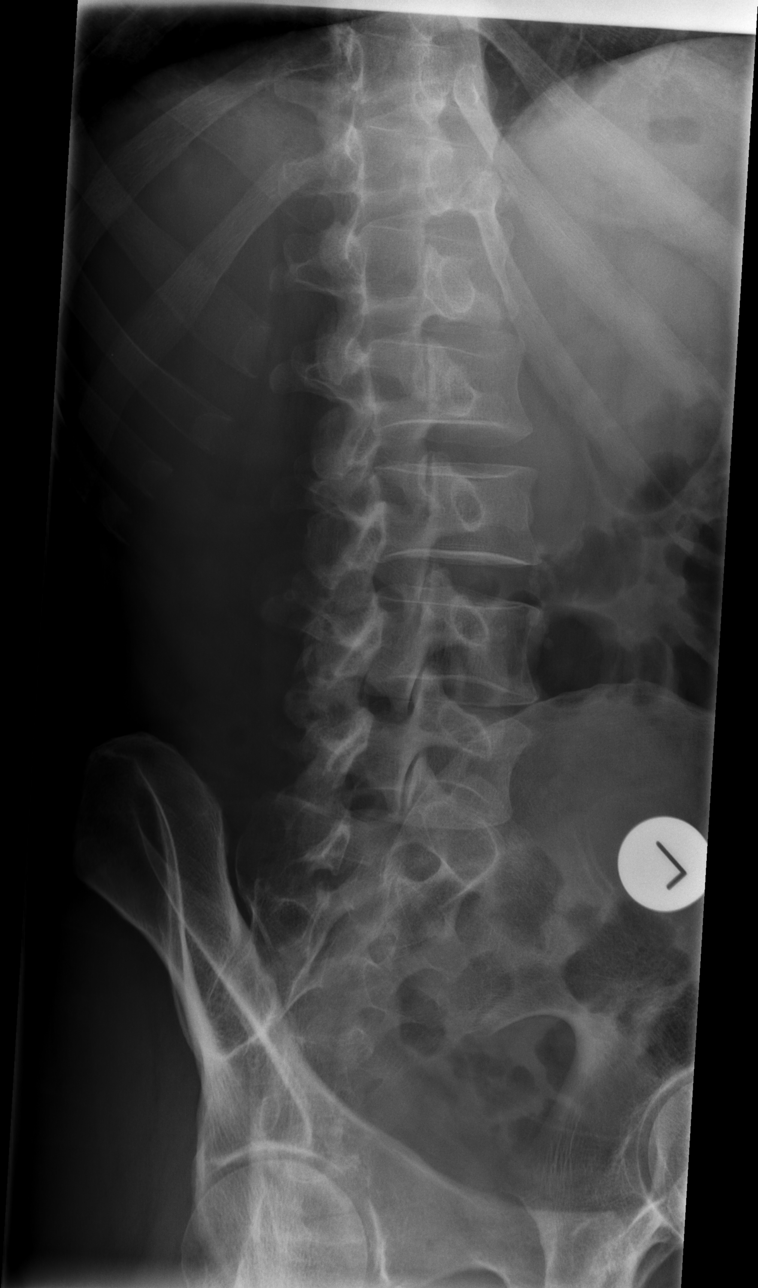

[t lumbar spine lat]
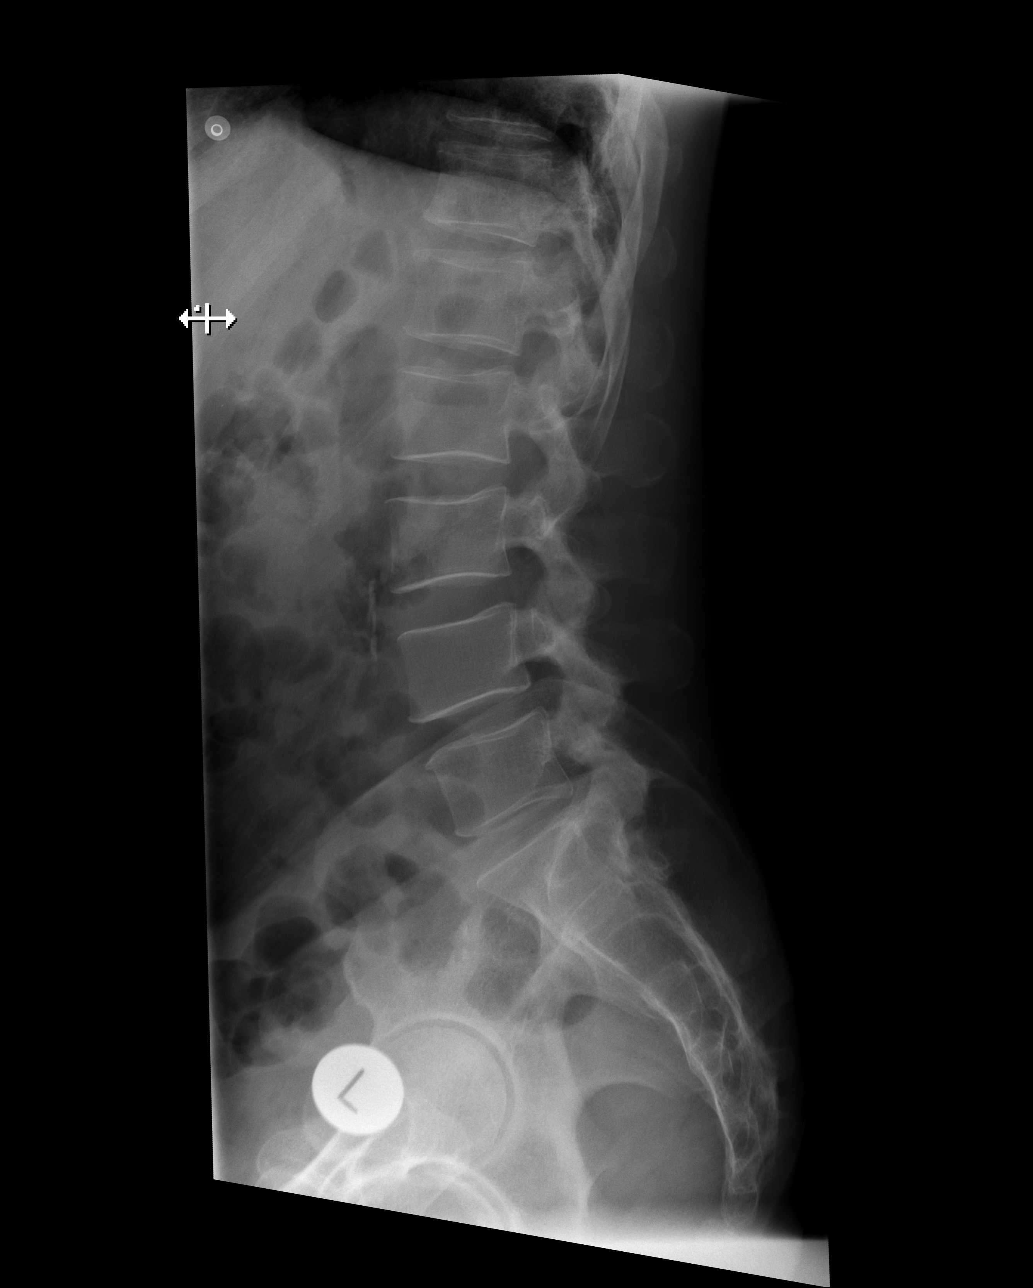

[t lumbar l-5 s-1 spot]
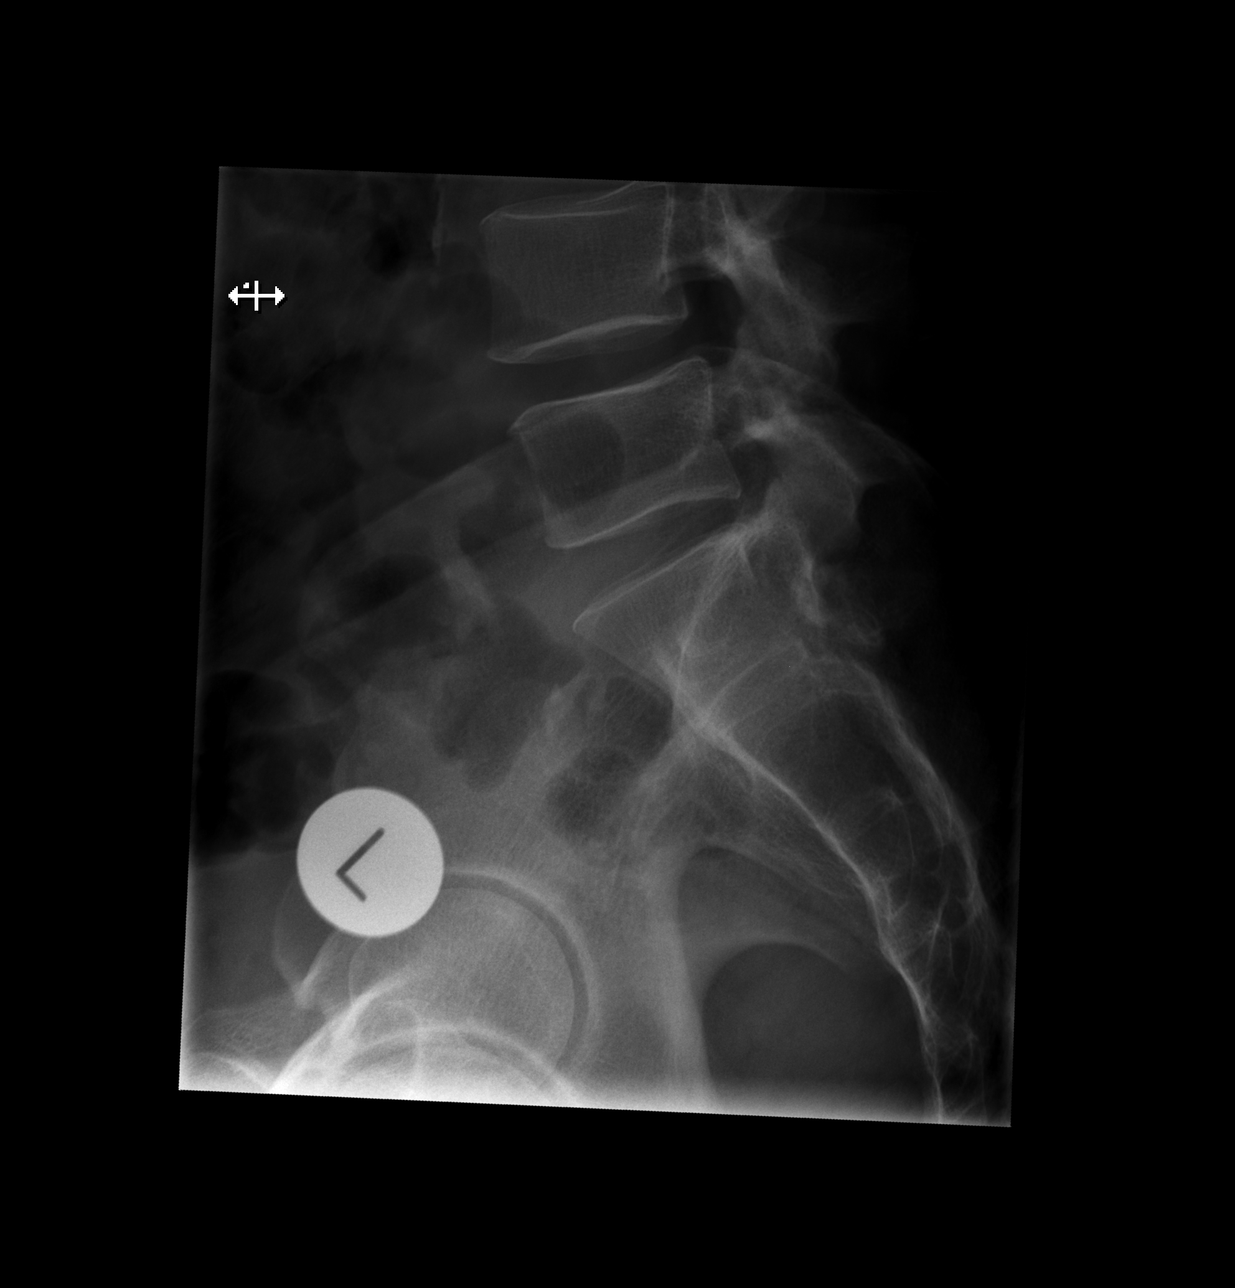

[5 of 5 positions shown; findings below may reference images not displayed]

FINDINGS: This report assumes 5 non rib-bearing lumbar vertebrae.

Lumbar vertebral body heights are preserved, with no fracture.

There is mild loss of disc height at L4-5 with associated minimal
spondylosis, not appreciably changed. Otherwise preserved lumbar
disc spaces. There is new 3 mm anterolisthesis at L4-5. No
appreciable facet arthropathy. No appreciable foraminal stenosis. No
aggressive appearing focal osseous lesions.
IMPRESSION: 1. No lumbar spine fracture.
2. Stable mild degenerative disc disease at L4-5.
3. New mild 3 mm anterolisthesis at L4-5.

## 2017-07-06 IMAGING — DX DG CHEST 1V PORT
1 series · 1 of 1 positions shown · non-contrast
Comparison: 02/13/2016.

CLINICAL DATA: Altered mental status.  Depression.

EXAM:
PORTABLE CHEST 1 VIEW

[chest ap]
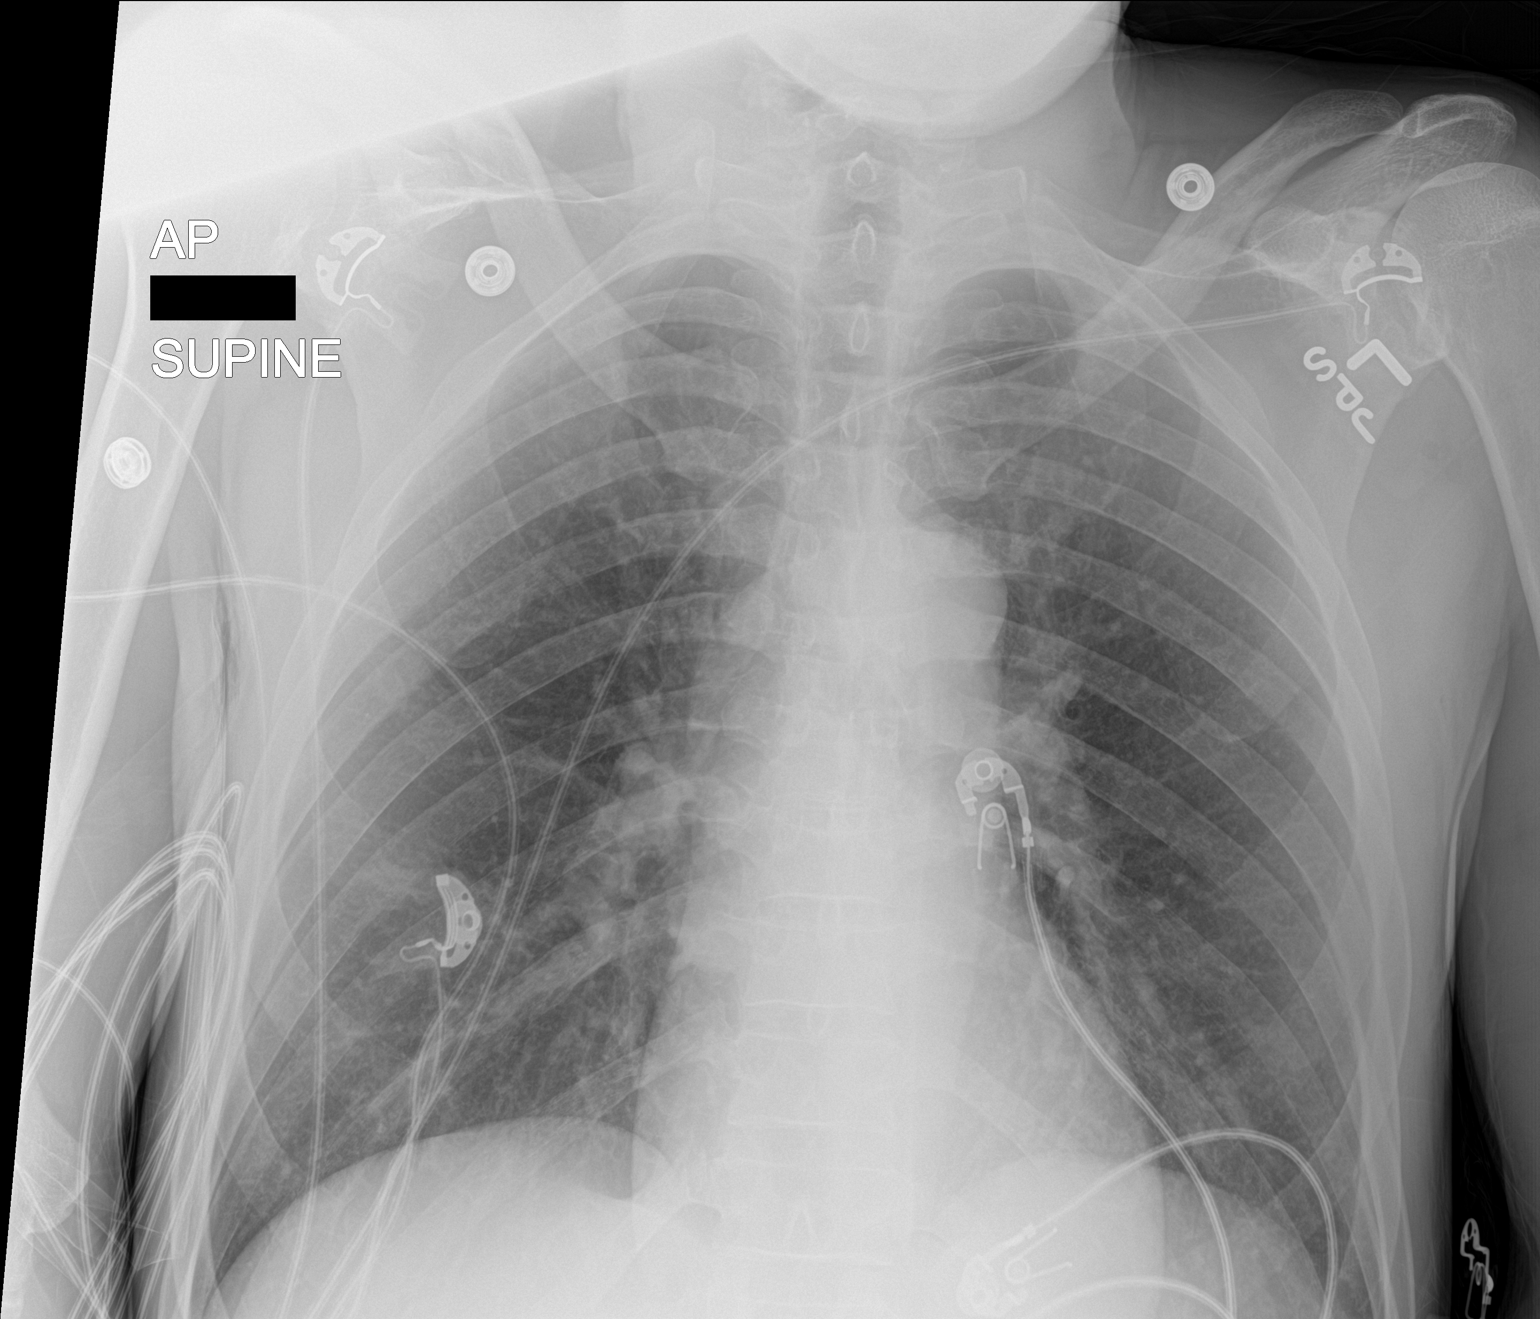

[1 of 1 positions shown; findings below may reference images not displayed]

FINDINGS: Midline trachea. Normal heart size and mediastinal contours. Left
costophrenic angle exclusion. No pleural effusion or pneumothorax.
Clear lungs.
IMPRESSION: No active disease.

## 2017-07-06 IMAGING — CT CT HEAD W/O CM
2 series · 15 of 30 positions shown, 19 images · non-contrast
Comparison: June 01, 2009

CLINICAL DATA: Fever and altered mental status; tremors

EXAM:
CT HEAD WITHOUT CONTRAST
TECHNIQUE: Contiguous axial images were obtained from the base of the skull
through the vertex without intravenous contrast.

[Series 201: head w/o, idose (1) · axial · non-contrast · 0.49mm/px · z∈[+67,+197]mm · 13 of 32 slices shown, 17 images]
[im 3/32  brain]
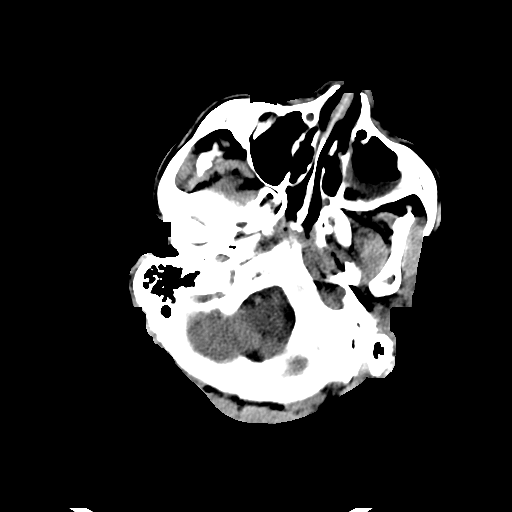
[im 3/32  bone]
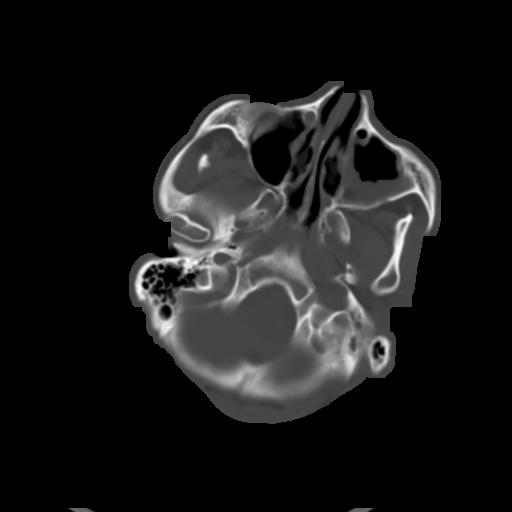
[im 5/32  brain]
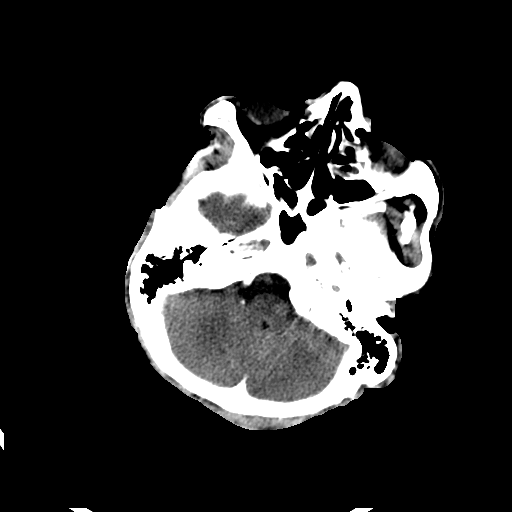
[im 7/32  brain]
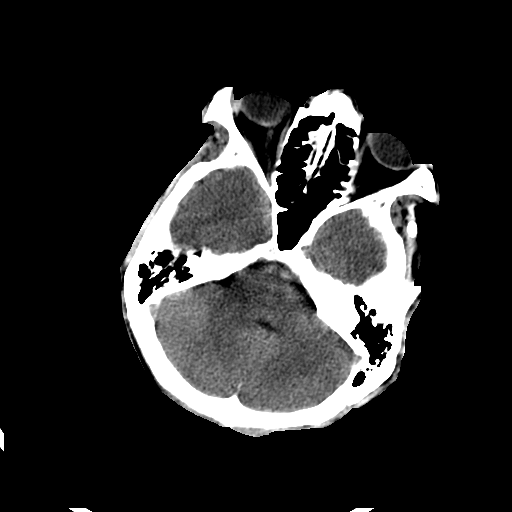
[im 9/32  brain]
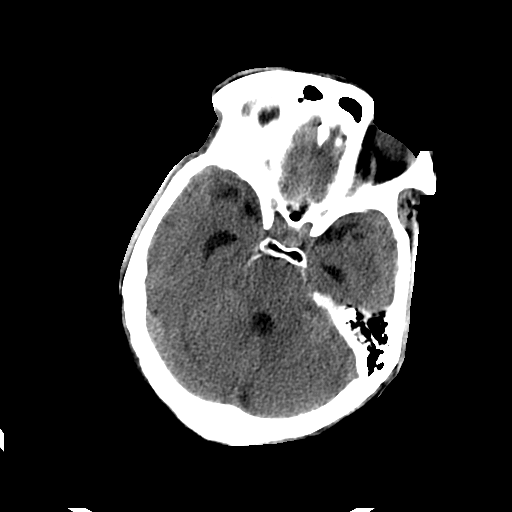
[im 12/32  brain]
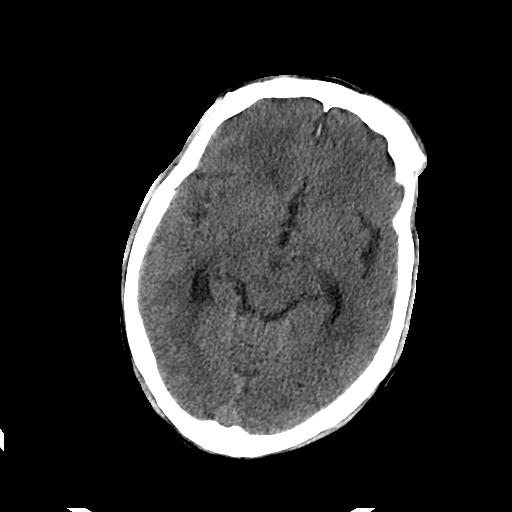
[im 12/32  bone]
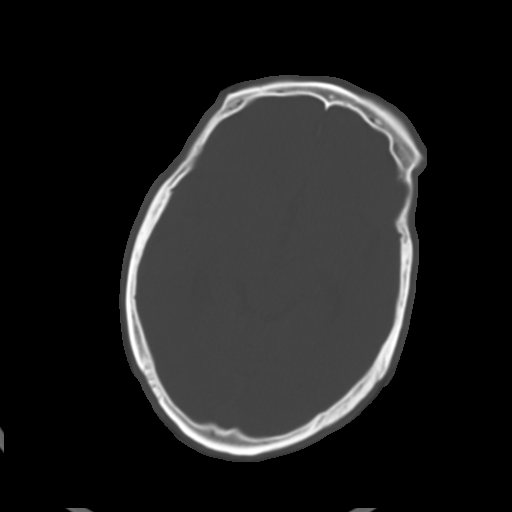
[im 14/32  brain]
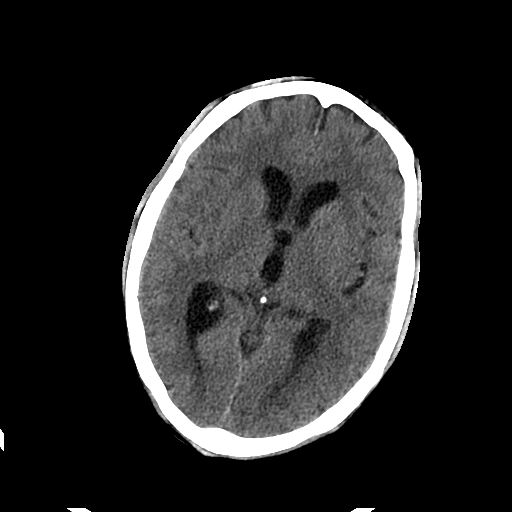
[im 16/32  brain]
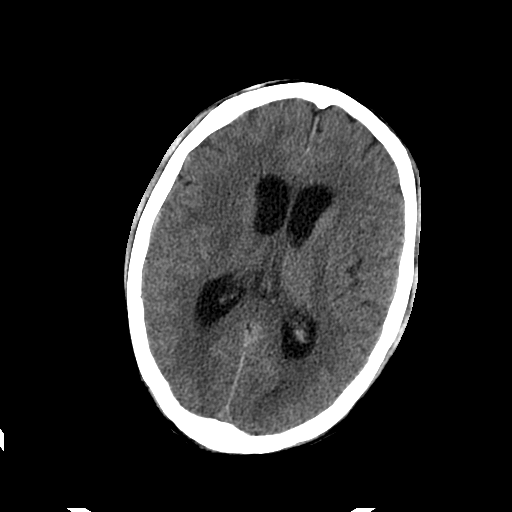
[im 18/32  brain]
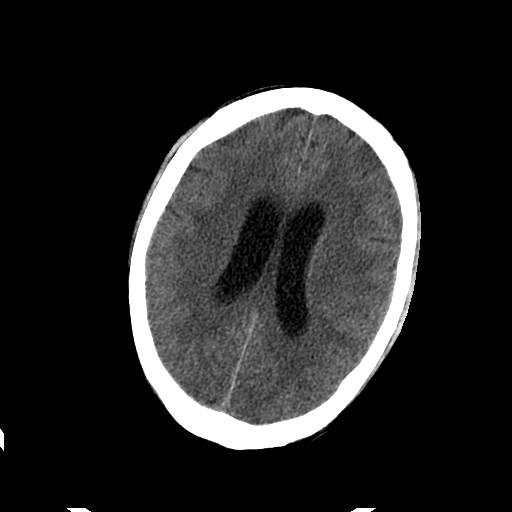
[im 20/32  brain]
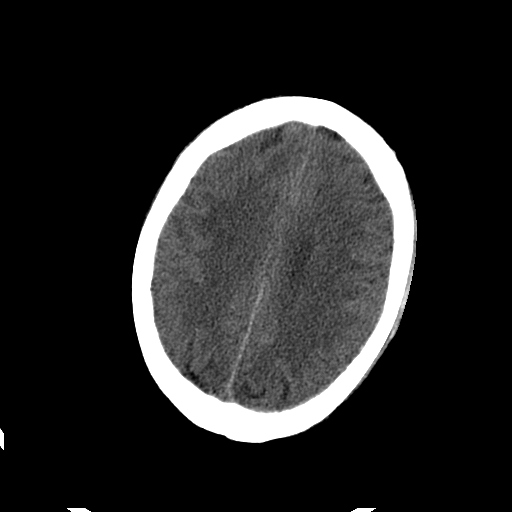
[im 20/32  bone]
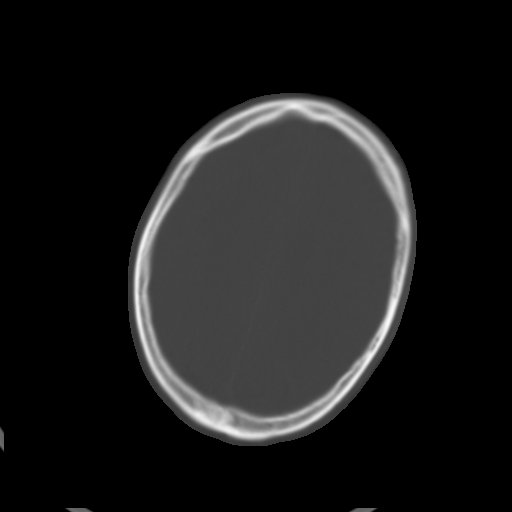
[im 23/32  brain]
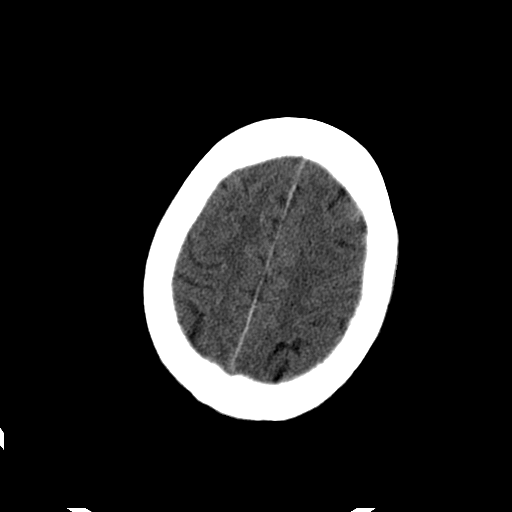
[im 25/32  brain]
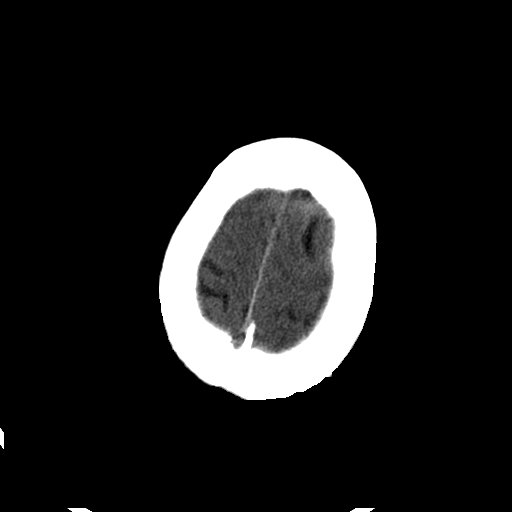
[im 27/32  brain]
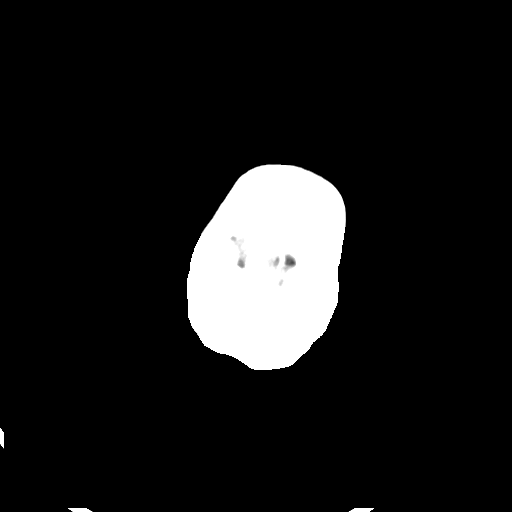
[im 29/32  brain]
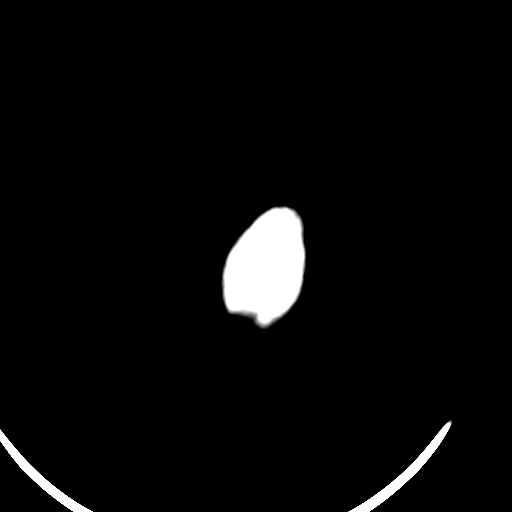
[im 29/32  bone]
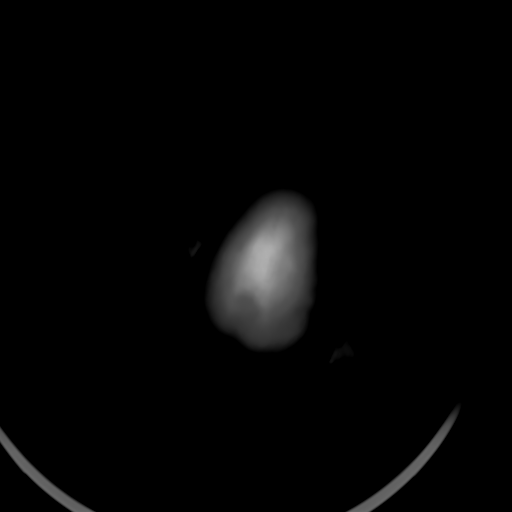

[Series 202: head w/o bone, idose (1) · axial · non-contrast · 0.49mm/px · z∈[+67,+87]mm · 2 of 32 slices shown]
[im 3/32  bone]
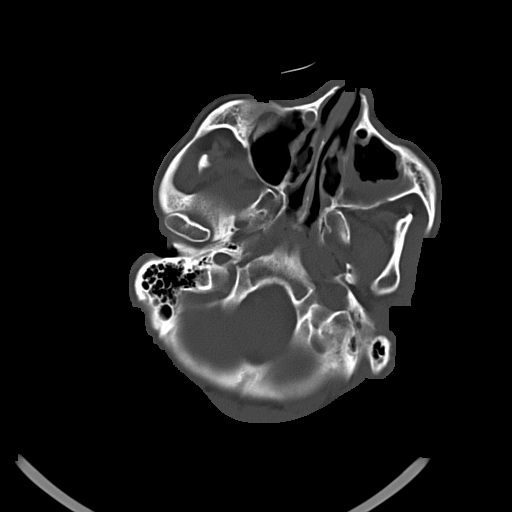
[im 7/32  bone]
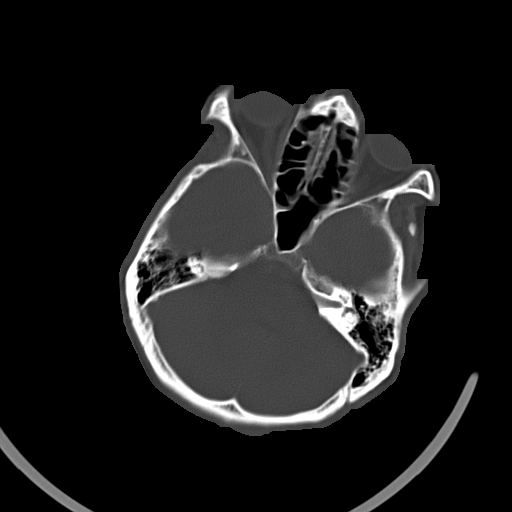

[15 of 30 positions shown; findings below may reference images not displayed]

FINDINGS: The ventricles are mildly enlarged in a generalized manner and
appear larger compared to the prior study. The sulci appear
unremarkable. There is no intracranial mass, hemorrhage, extra-axial
fluid collection, or midline shift. No focal gray-white compartment
lesions are identified. No evidence of acute infarct. Bony calvarium
appears intact. The mastoid air cells are clear. There is
opacification in the left maxillary antrum as well as in multiple
ethmoid air cells bilaterally. There is mild mucosal thickening in
the left sphenoid sinus region. No intraorbital lesions are
apparent.
IMPRESSION: Ventricles mildly enlarged with sulci appearing normal. This
appearance potentially could represent a degree of normal pressure
hydrocephalus. This finding also could be secondary to prior or
recent meningitis meningitis.

No intracranial mass, hemorrhage, or extra-axial fluid collection.
No acute infarct evident.

Multifocal sinusitis.

Given these current findings, brain MRI pre and post-contrast may be
helpful to further assess, in particular to further evaluate the
meningeal regions.

## 2017-07-06 IMAGING — CR DG CHEST 1V PORT
1 series · 1 of 1 positions shown · non-contrast
Comparison: Chest x-ray from earlier same day.

CLINICAL DATA: Endotracheal tube placement

EXAM:
PORTABLE CHEST 1 VIEW

[AP]
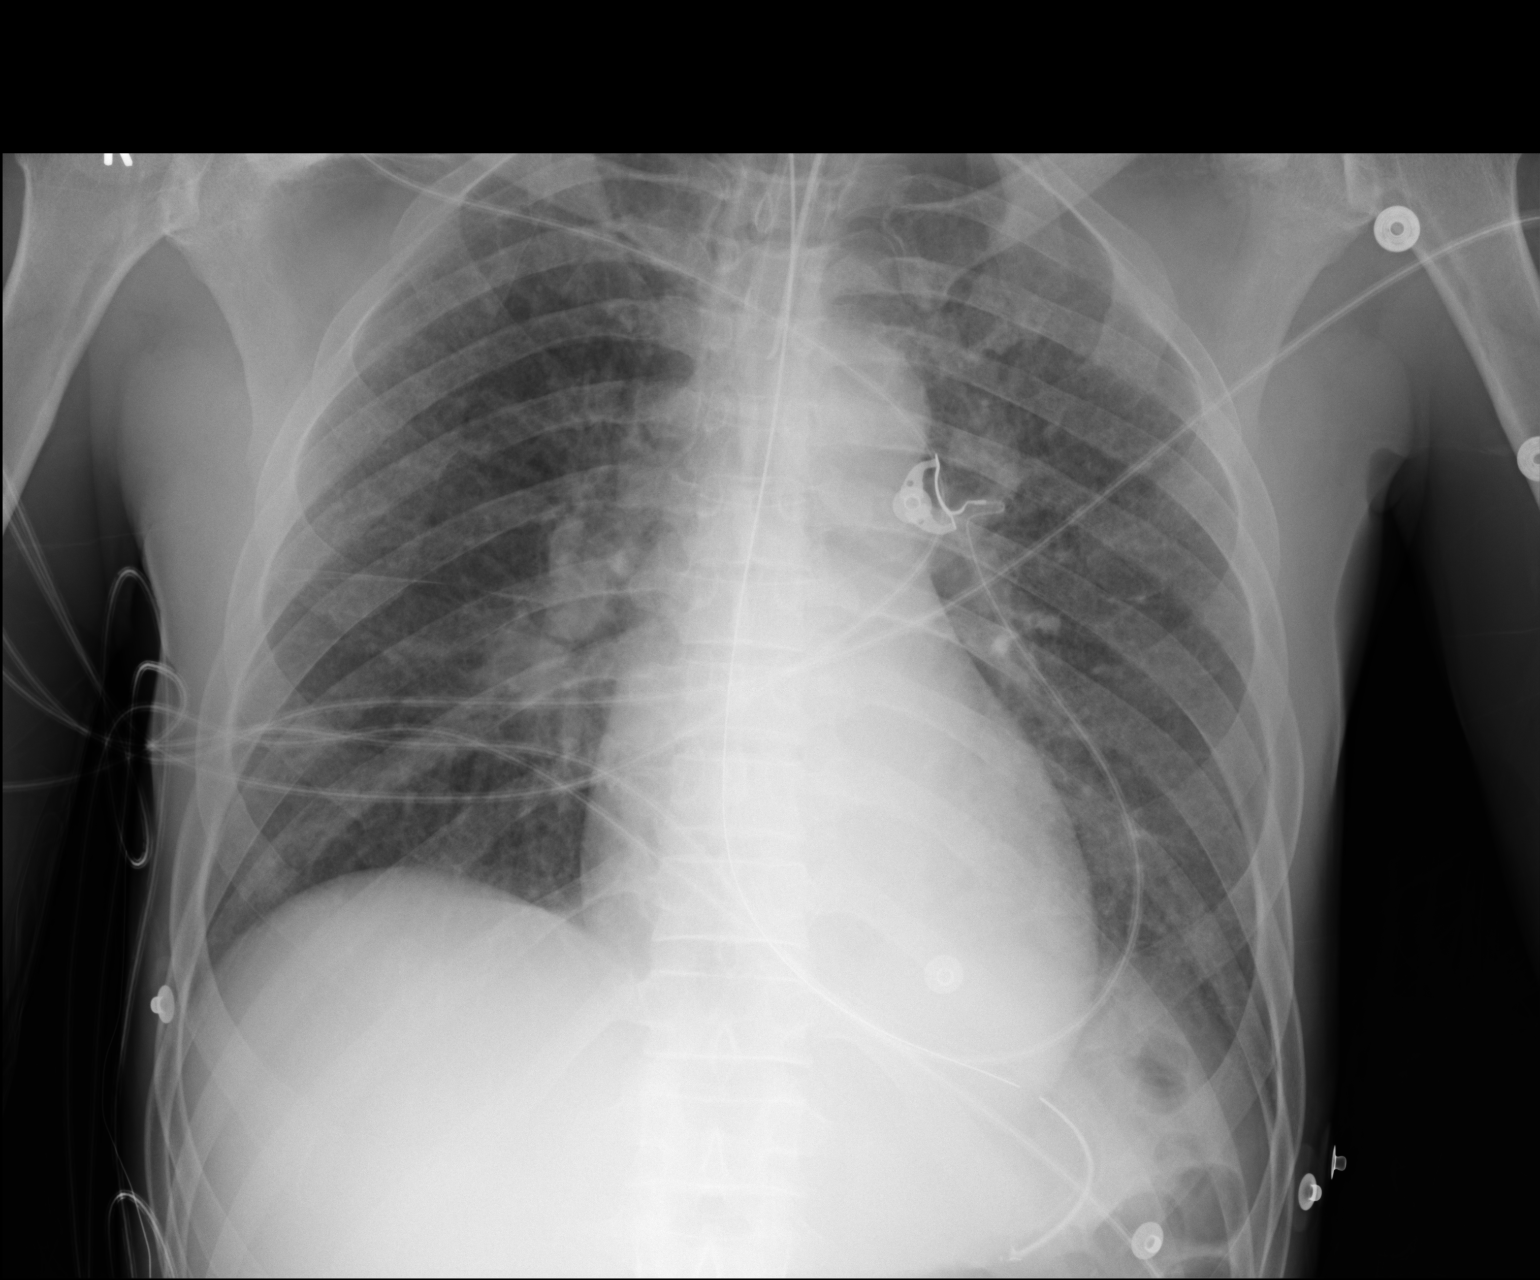

[1 of 1 positions shown; findings below may reference images not displayed]

FINDINGS: Endotracheal tube well positioned with tip approximately 2 cm above
the carina. Enteric tube passes below the diaphragm.

Heart size is normal. There is central pulmonary vascular congestion
and mild interstitial edema. Suspect small left pleural effusion. No
pneumothorax. Osseous structures about the chest are unremarkable.
IMPRESSION: 1. Endotracheal tube well positioned with tip approximately 2 cm
above the carina.
2. Central pulmonary vascular congestion and mild bilateral
interstitial edema suggesting mild volume overload/resuscitation
efforts. No frank alveolar pulmonary edema.

## 2017-07-07 IMAGING — CT CT HEAD W/O CM
1 of 2 series · 13 of 30 positions shown, 17 images · non-contrast
Comparison: 02/16/2016

CLINICAL DATA: 49-year-old male with a history of hydrocephalus.
Acute meningitis.

EXAM:
CT HEAD WITHOUT CONTRAST
TECHNIQUE: Contiguous axial images were obtained from the base of the skull
through the vertex without intravenous contrast.

[Series 3: — · axial · 0.49mm/px · z∈[-321,-131]mm · 13 of 46 slices shown, 17 images]
[im 4/46  brain]
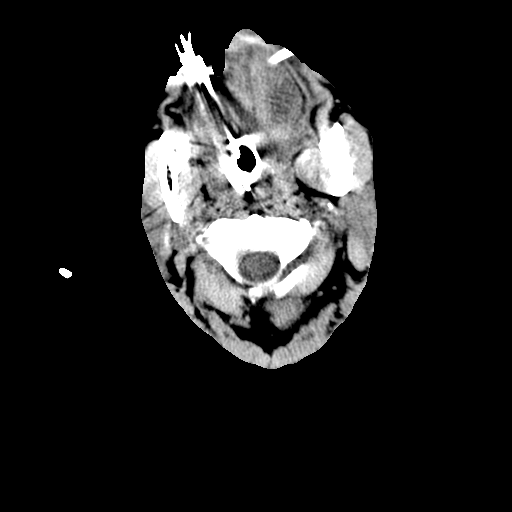
[im 4/46  bone]
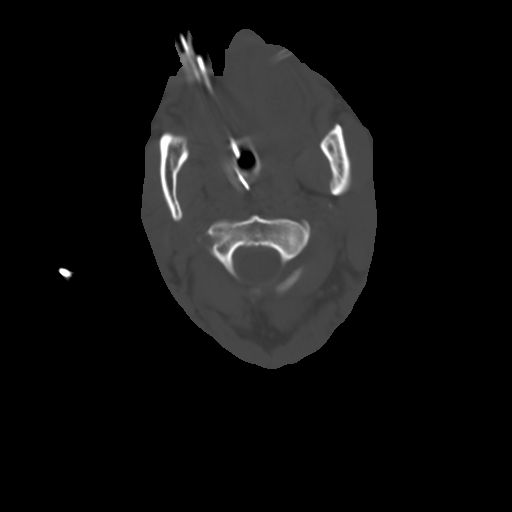
[im 7/46  brain]
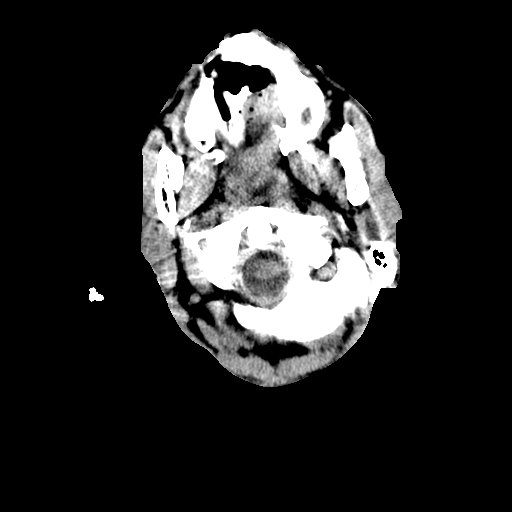
[im 10/46  brain]
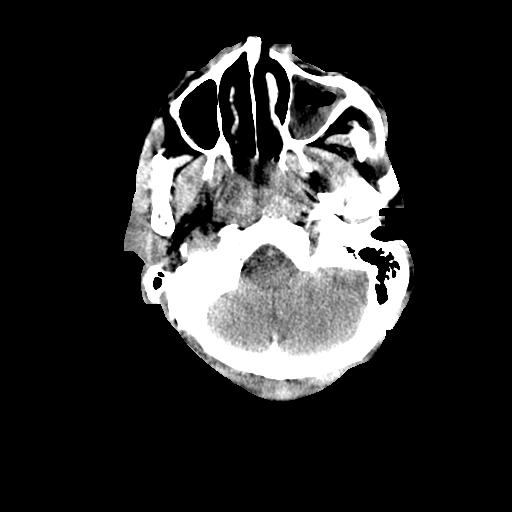
[im 13/46  brain]
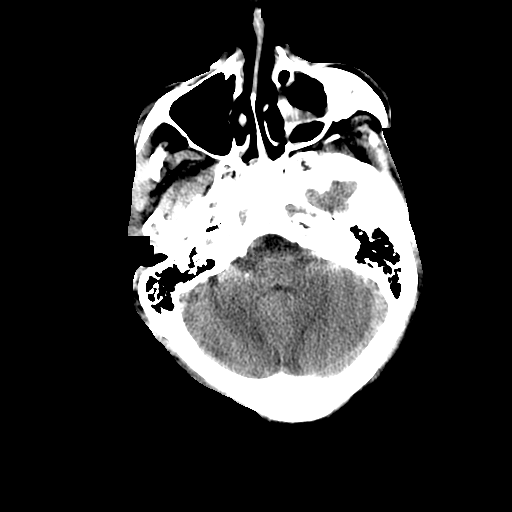
[im 17/46  brain]
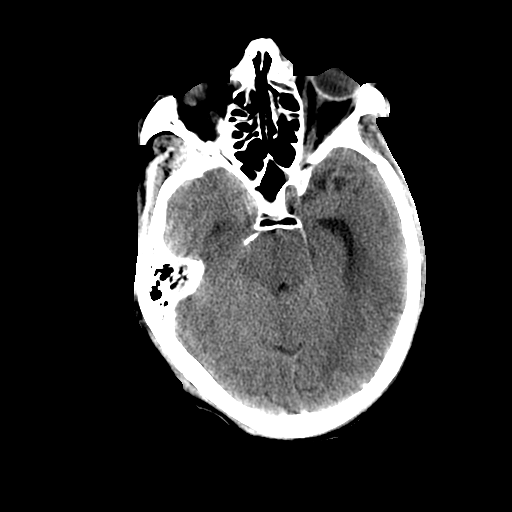
[im 17/46  bone]
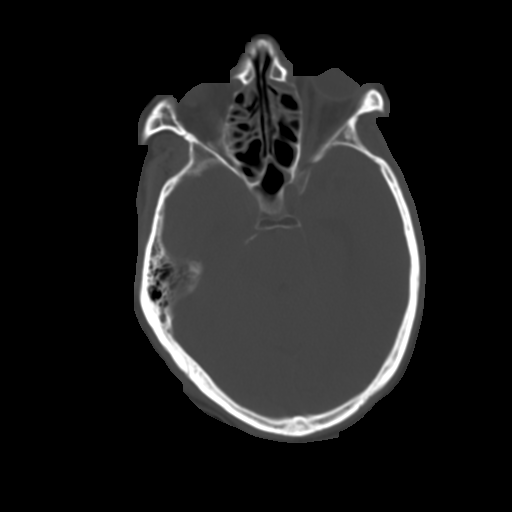
[im 20/46  brain]
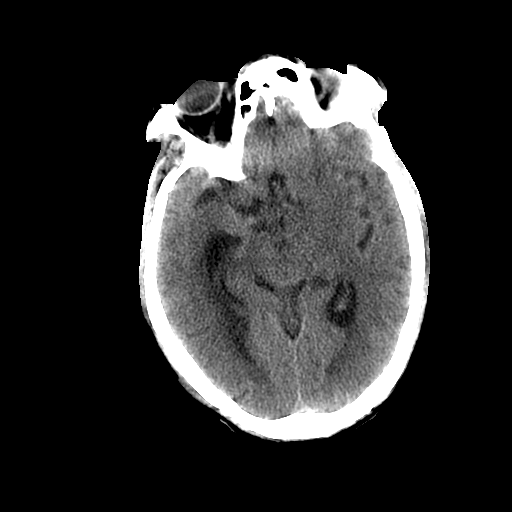
[im 23/46  brain]
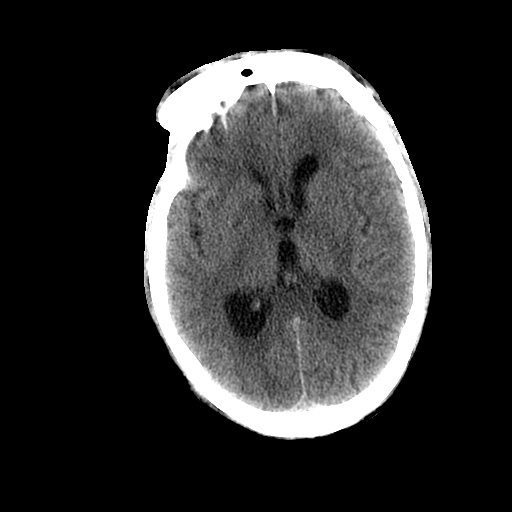
[im 26/46  brain]
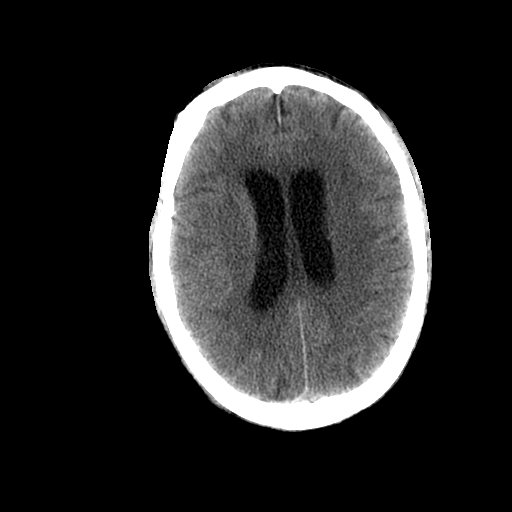
[im 29/46  brain]
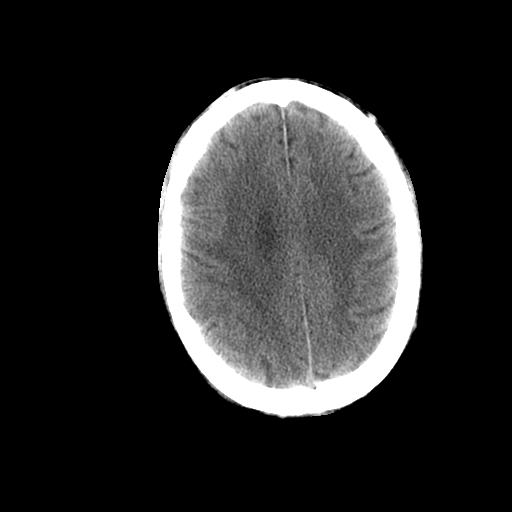
[im 29/46  bone]
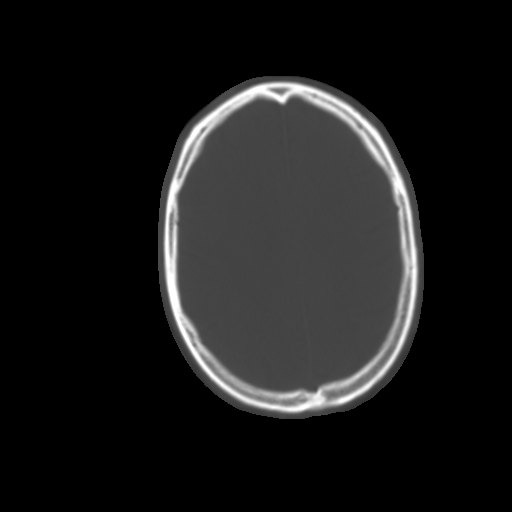
[im 33/46  brain]
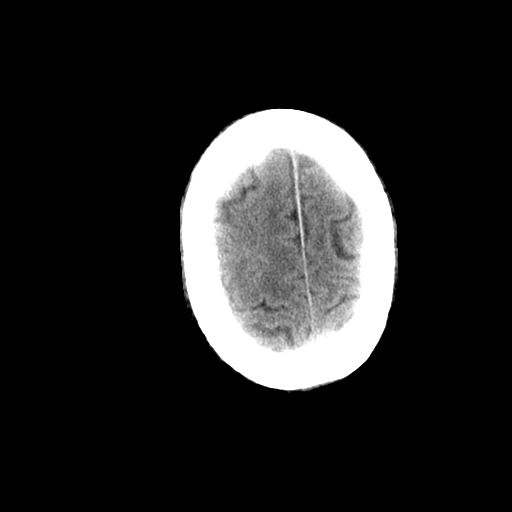
[im 36/46  brain]
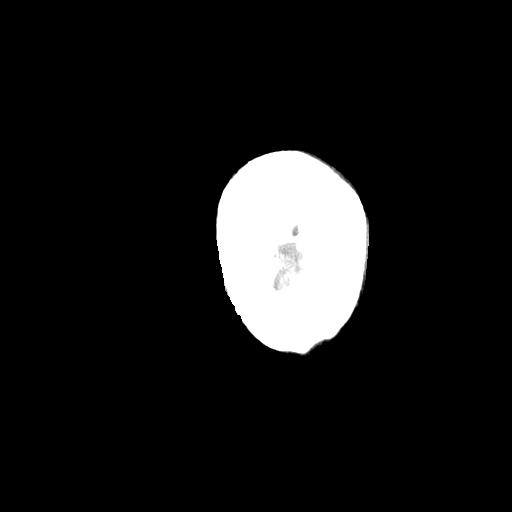
[im 39/46  brain]
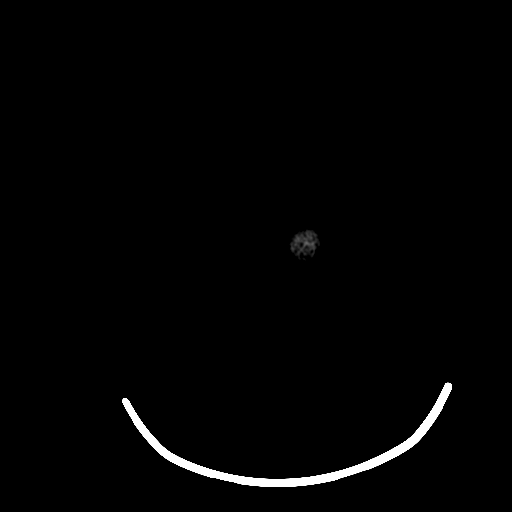
[im 42/46  brain]
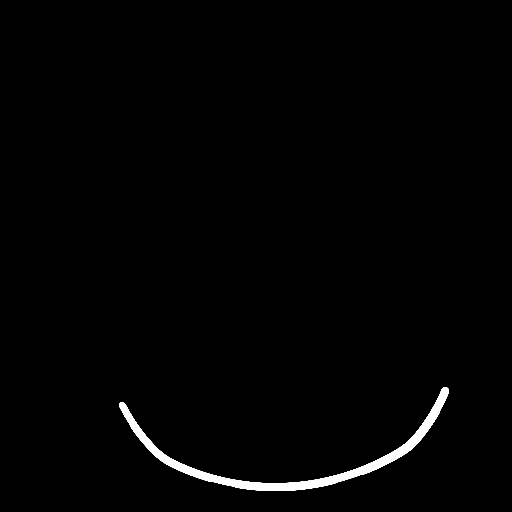
[im 42/46  bone]
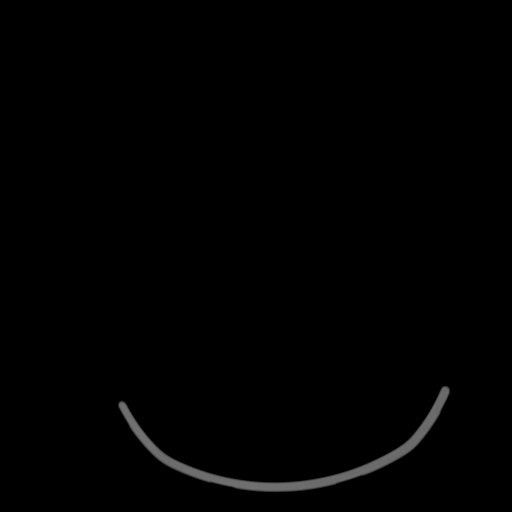

[13 of 30 positions shown; findings below may reference images not displayed]

FINDINGS: Unremarkable appearance of the calvarium without acute fracture or
aggressive lesion.

Unremarkable appearance of the scalp soft tissues.

Unremarkable appearance of the bilateral orbits.

Partially visualized endotracheal tube.  Oral enteric tube.

Near complete opacification of the left maxillary sinus with frothy
secretions. Trace secretions of the right maxillary sinus.
Mucoperiosteal thickening of the ethmoid air cells and the sphenoid
sinuses. Trace disease of the frontal sinuses.

Mastoid air cells are clear.

Layered fluid within the nasopharynx.

Configuration the ventricles is similar to comparison with bifrontal
diameter measuring 40 mm, unchanged from prior.

Unchanged appearance of the hemispheric sulci.

No acute intracranial hemorrhage.  No midline shift.

Gray-white differentiation relatively maintained.
IMPRESSION: Unchanged ventricular dilation and appearance of the cerebral sulci,
with no evidence of acute hemorrhage or infarction.

## 2017-07-07 IMAGING — CR DG CHEST 1V PORT
2 series · 2 of 2 positions shown · non-contrast
Comparison: Earlier the same day.

CLINICAL DATA: Ventilator dependence.

EXAM:
PORTABLE CHEST 1 VIEW

[AP (1 of 2)]
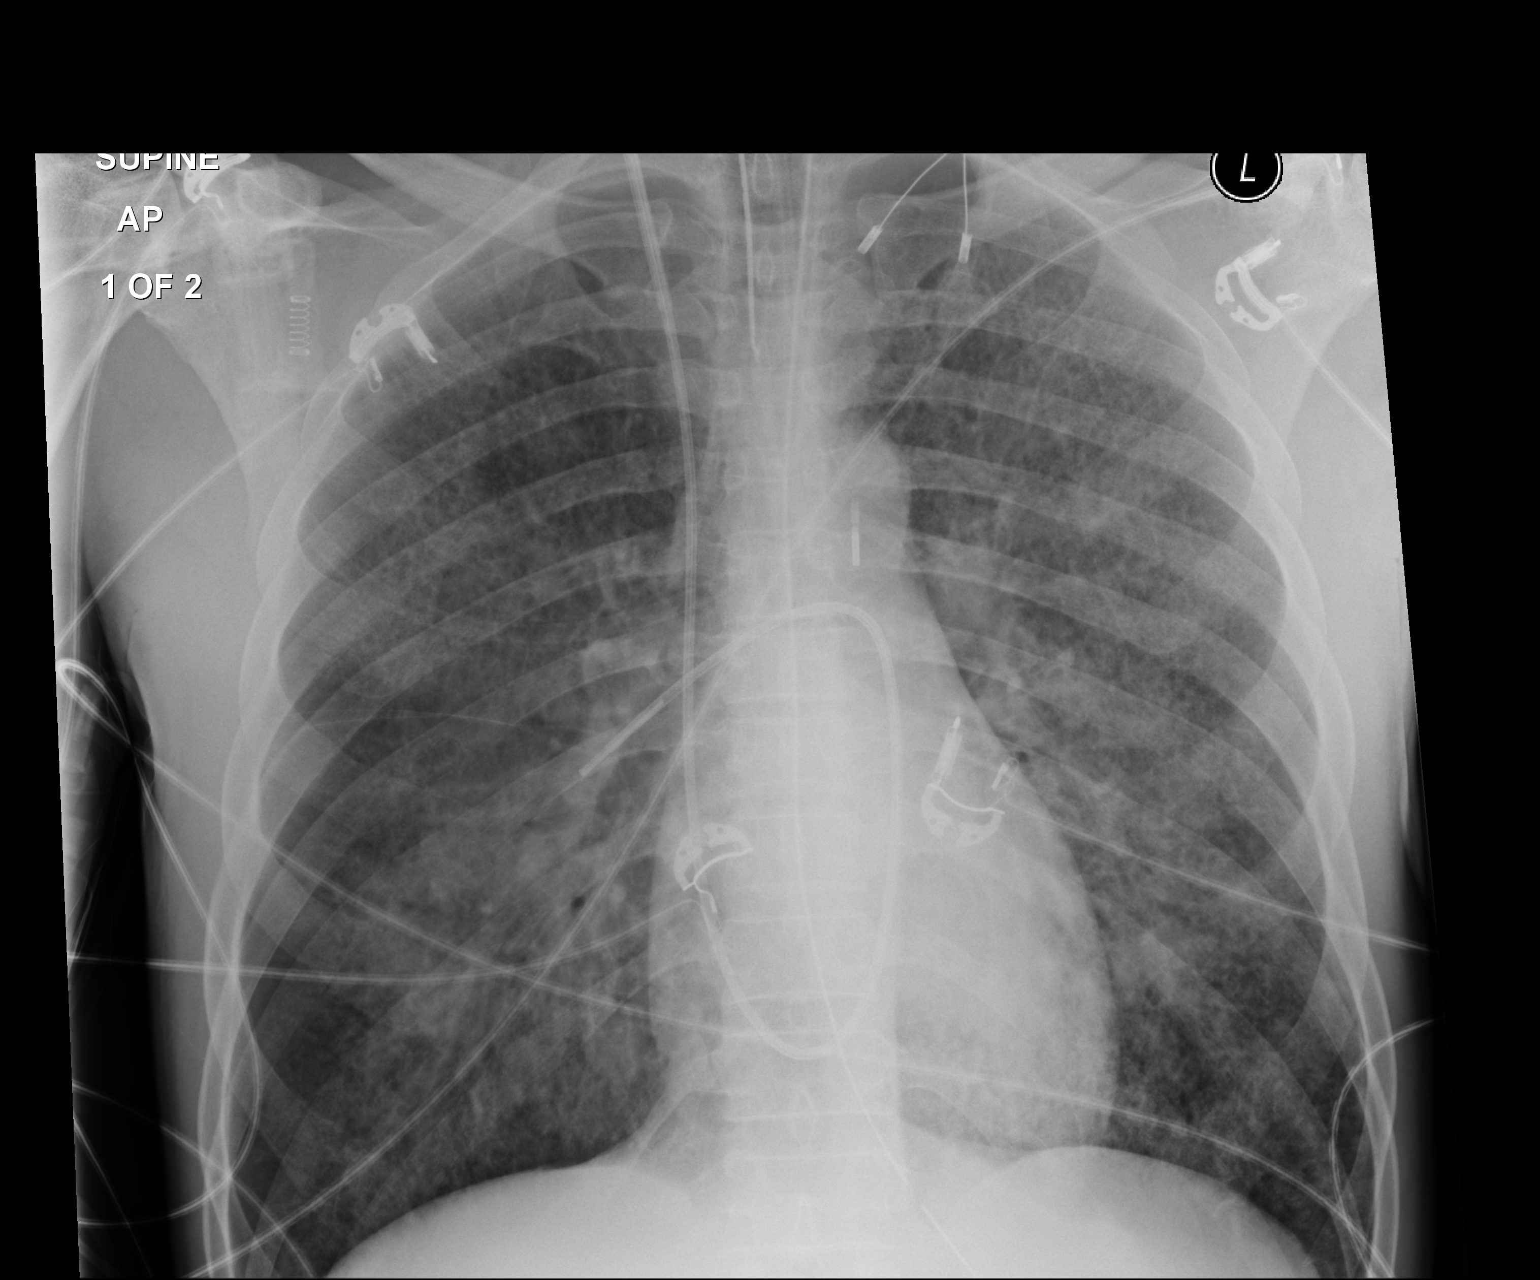

[AP (2 of 2)]
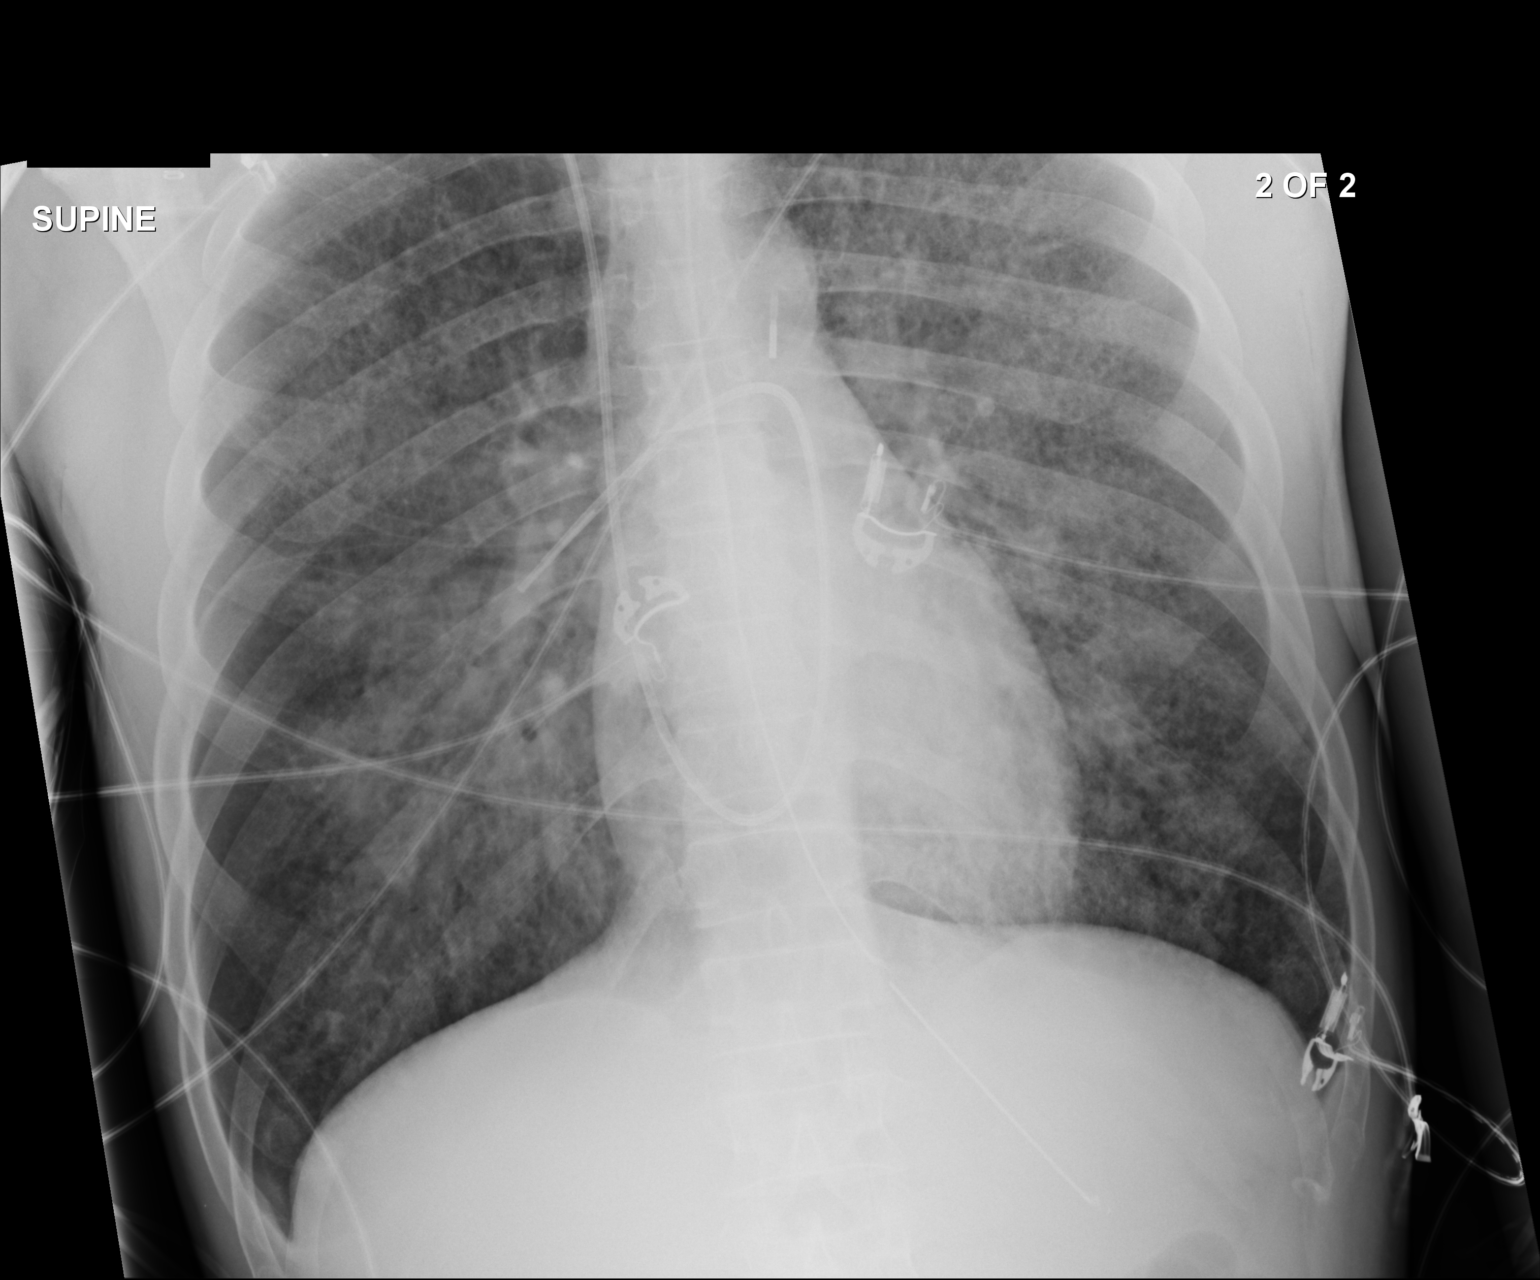

[2 of 2 positions shown; findings below may reference images not displayed]

FINDINGS: 5800 hours. Endotracheal tube tip is 7.4 cm above the base of the
carina. Right IJ pulmonary artery catheter tip is in the interlobar
pulmonary artery. The tip of the intra-aortic balloon pump projects
over the inferior aspect of the transverse aorta. The NG tube passes
into the stomach although the distal tip position is not included on
the film. Proximal port of the NG tube is at or just above the EG
junction. Diffuse interstitial and central alveolar opacities
suggest edema. Telemetry leads overlie the chest.
IMPRESSION: The tip of the intra-aortic balloon pump projects over the inferior
aspect of the transverse aorta.

## 2017-07-08 IMAGING — CR DG CHEST 1V PORT
1 series · 1 of 1 positions shown · non-contrast
Comparison: February 17, 2016.

CLINICAL DATA: Entry aortic balloon pump asist.

EXAM:
PORTABLE CHEST 1 VIEW

[AP]
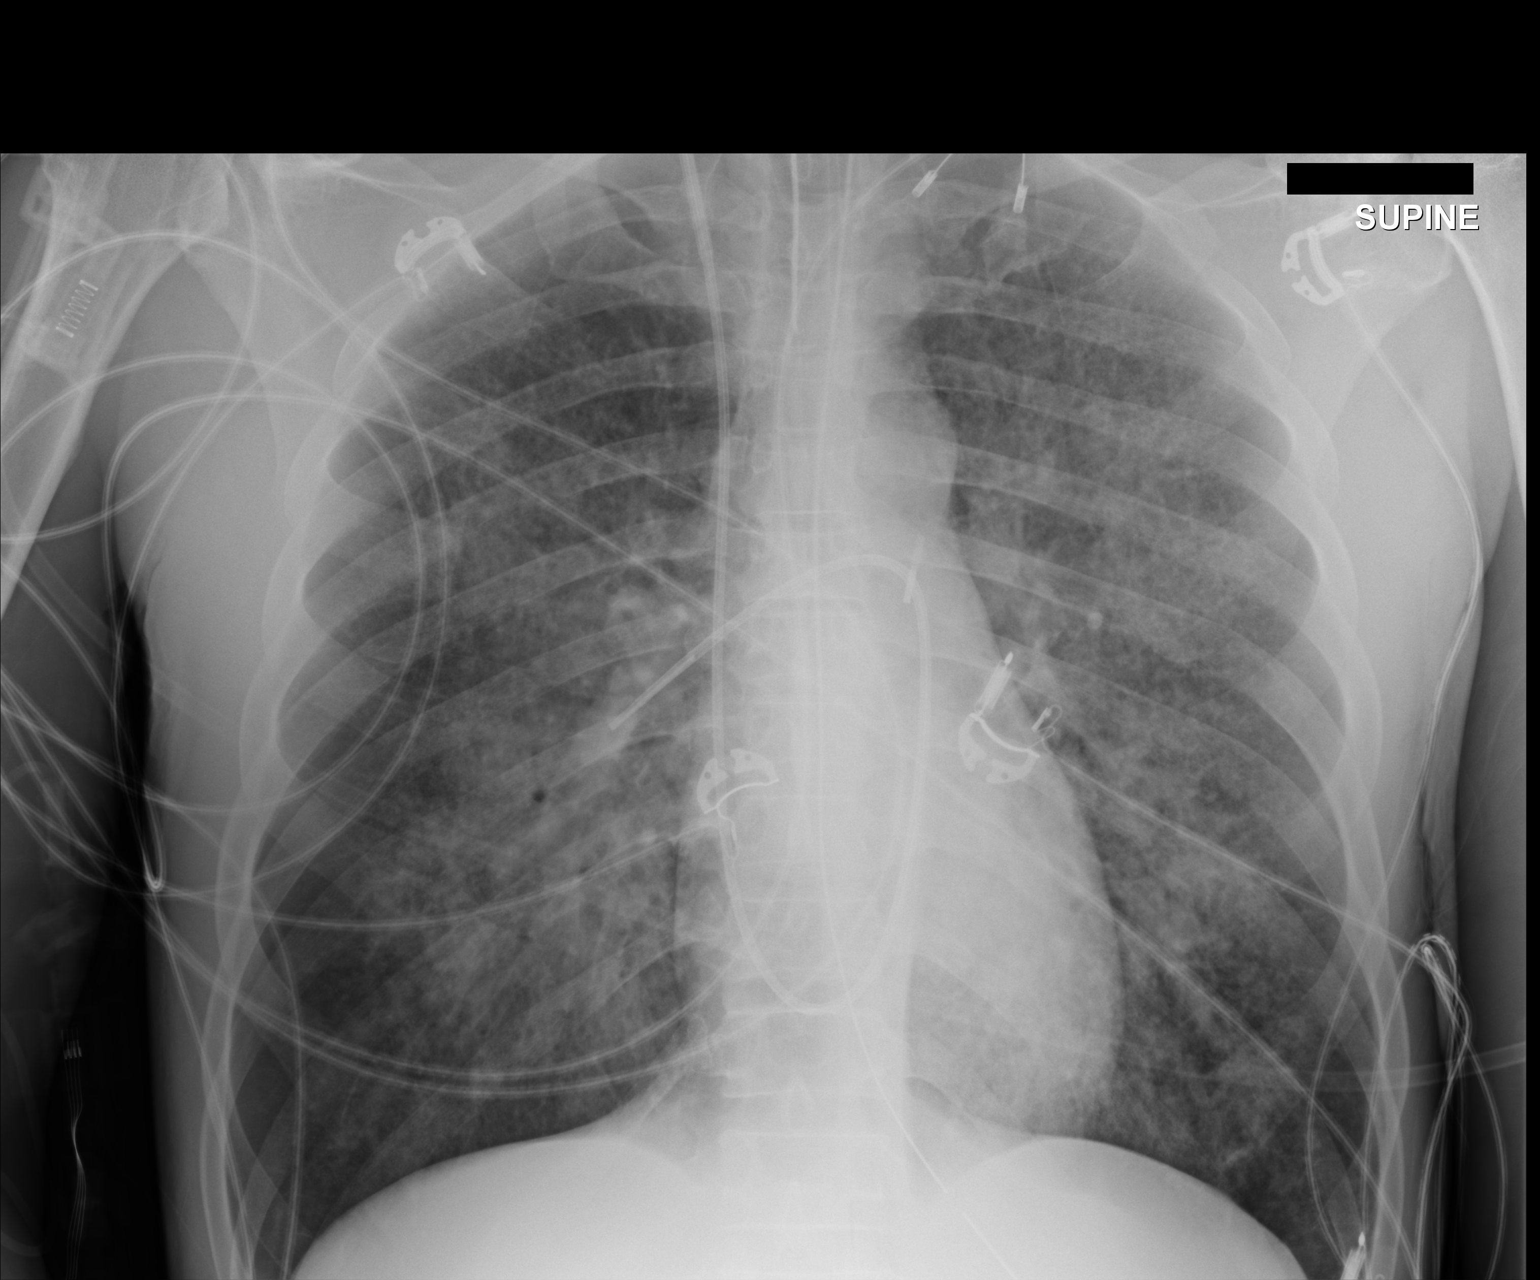

[1 of 1 positions shown; findings below may reference images not displayed]

FINDINGS: The heart size and mediastinal contours are within normal limits. No
pneumothorax or pleural effusion is noted. Endotracheal nasogastric
tubes are unchanged in position. Right internal jugular Swan-Ganz
catheter is unchanged with distal tip in expected position of right
pulmonary artery. Distal tip of aortic balloon catheter is seen over
expected position of superior portion of descending thoracic aorta
just below aortic knob. Stable bilateral perihilar opacities are
noted concerning for edema or inflammation. The visualized skeletal
structures are unremarkable.
IMPRESSION: Distal tip of aortic balloon catheter appears to be slightly more
inferior in position compared to prior exam, just below aortic knob
currently. This projects over the expected position of the superior
aspect of descending thoracic aorta. Stable bilateral perihilar
opacities are noted concerning for edema or inflammation. Otherwise
stable support apparatus.

## 2017-07-09 IMAGING — DX DG CHEST 1V PORT
1 series · 1 of 1 positions shown · non-contrast
Comparison: 02/18/2016

CLINICAL DATA: CHF

EXAM:
PORTABLE CHEST 1 VIEW

[chest ap]
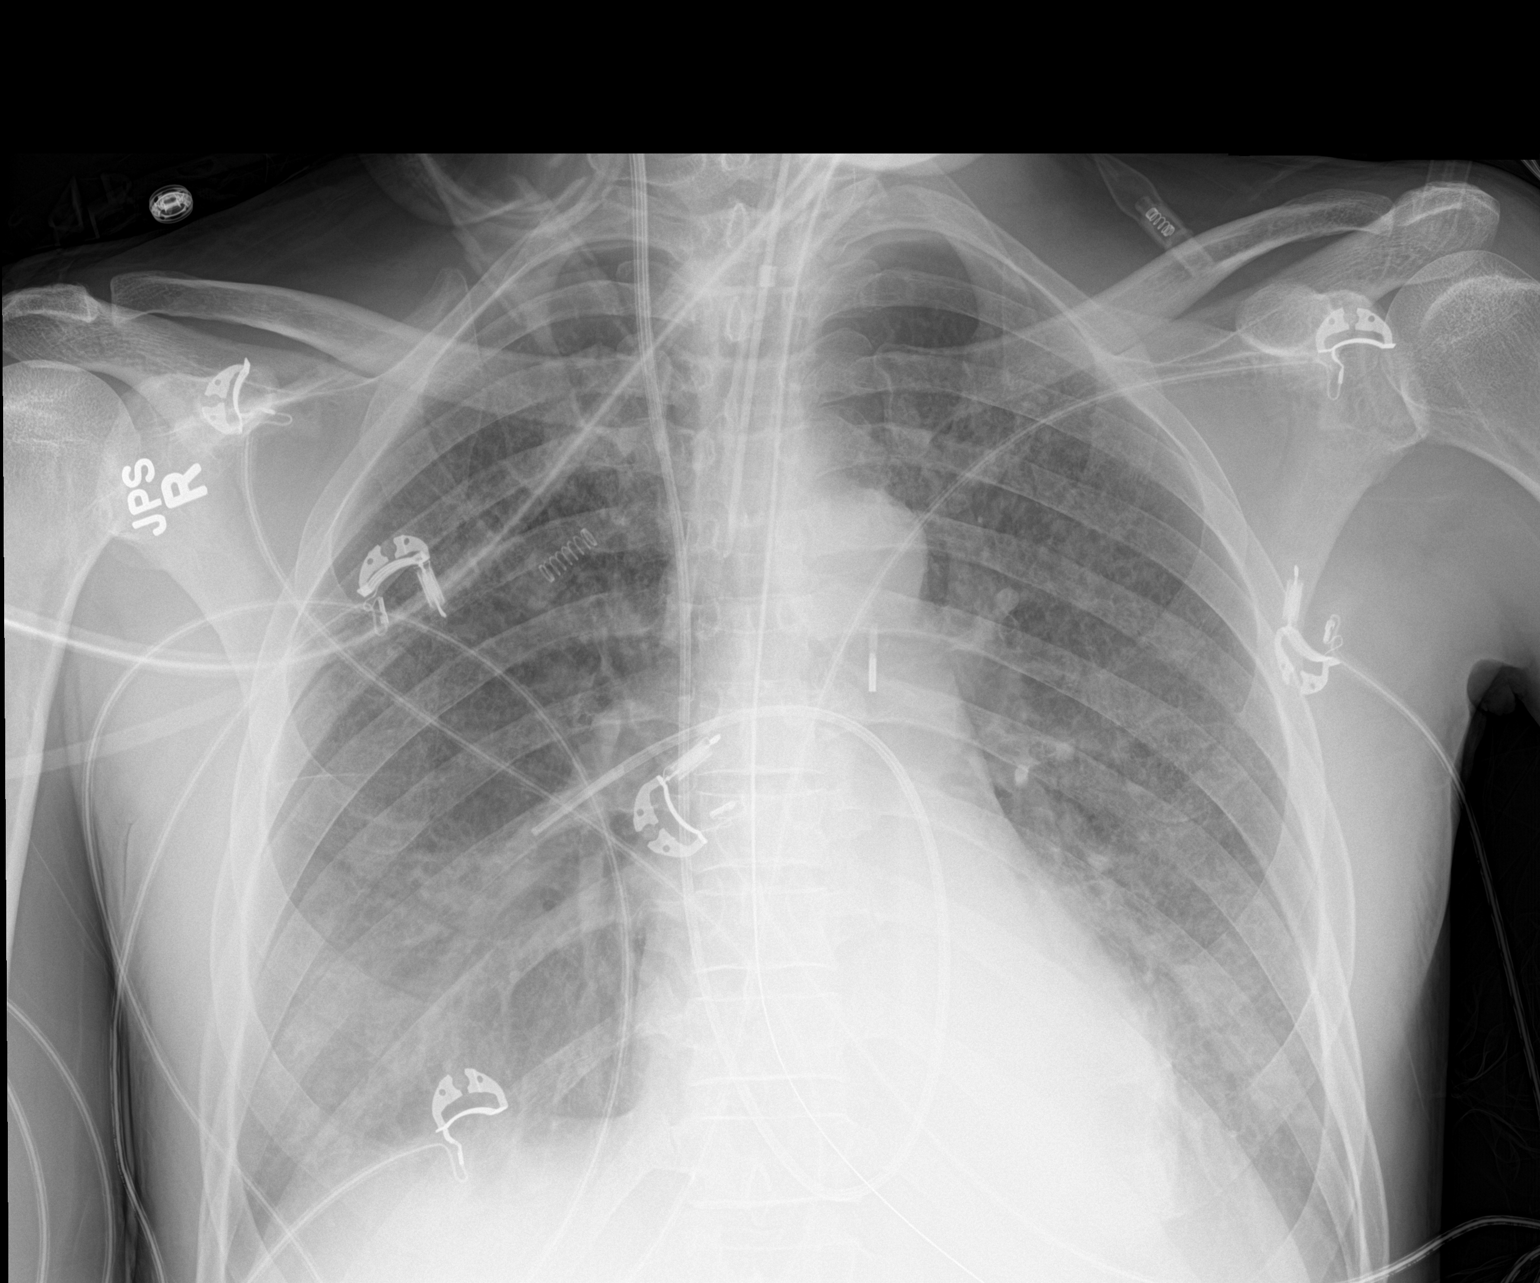

[1 of 1 positions shown; findings below may reference images not displayed]

FINDINGS: Support devices remain in place, unchanged. Heart is normal size.
Diffuse interstitial and alveolar opacities are again noted, likely
edema. This is somewhat more confluent in both lung bases, left
greater than right. Cannot completely exclude infiltrate at the left
base. No visible effusions. No acute bony abnormality.
IMPRESSION: Continued diffuse interstitial and alveolar opacities, likely edema.
Somewhat more focal opacities in the lung bases, left greater than
right. Cannot exclude pneumonia.

## 2017-07-10 IMAGING — CR DG CHEST 1V PORT
1 series · 1 of 1 positions shown · non-contrast
Comparison: Chest radiograph from one day prior.

CLINICAL DATA: CHF

EXAM:
PORTABLE CHEST 1 VIEW

[AP]
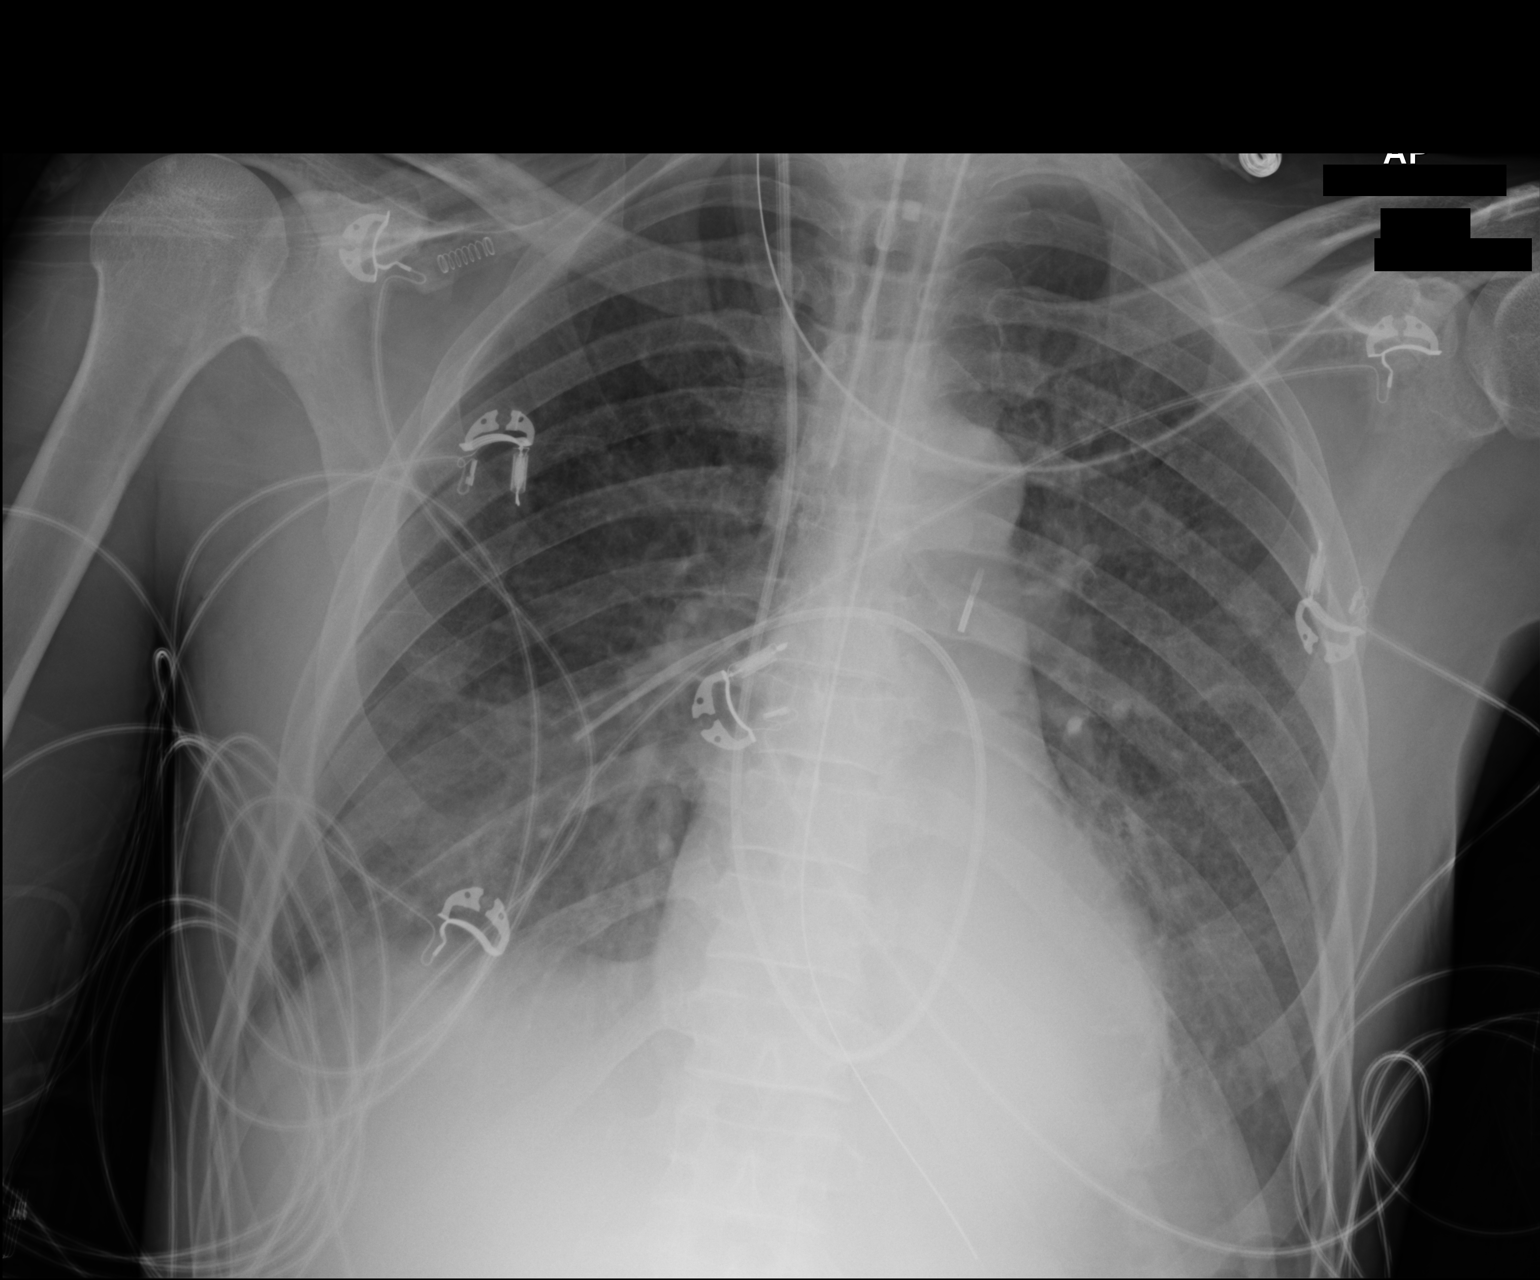

[1 of 1 positions shown; findings below may reference images not displayed]

FINDINGS: Right internal jugular Swan-Ganz catheter terminates over the
central right lower lung, unchanged. Endotracheal tube tip is 2.4 cm
above the carina. Enteric tube enters the stomach with the tip not
seen on this image. Intra-aortic balloon pump marker terminates over
the proximal descending thoracic aorta at the T7-8 level. Stable
cardiomediastinal silhouette with normal heart size. No
pneumothorax. No pleural effusion. Hazy opacities throughout the
bilateral parahilar lungs are not appreciably changed. Probable
complete left lower lobe atelectasis, unchanged.
IMPRESSION: 1. Right internal jugular Swan-Ganz catheter terminates over the
central lower right lung, consider retracting 3-4 cm .
2. Otherwise well-positioned support structures as described.
3. Stable hazy opacities throughout the parahilar lungs, favor
pulmonary edema.

## 2017-07-11 IMAGING — CR DG CHEST 1V PORT
2 series · 2 of 2 positions shown · non-contrast
Comparison: Chest radiograph from one day prior.

CLINICAL DATA: Respiratory failure

EXAM:
PORTABLE CHEST 1 VIEW

[AP (1 of 2)]
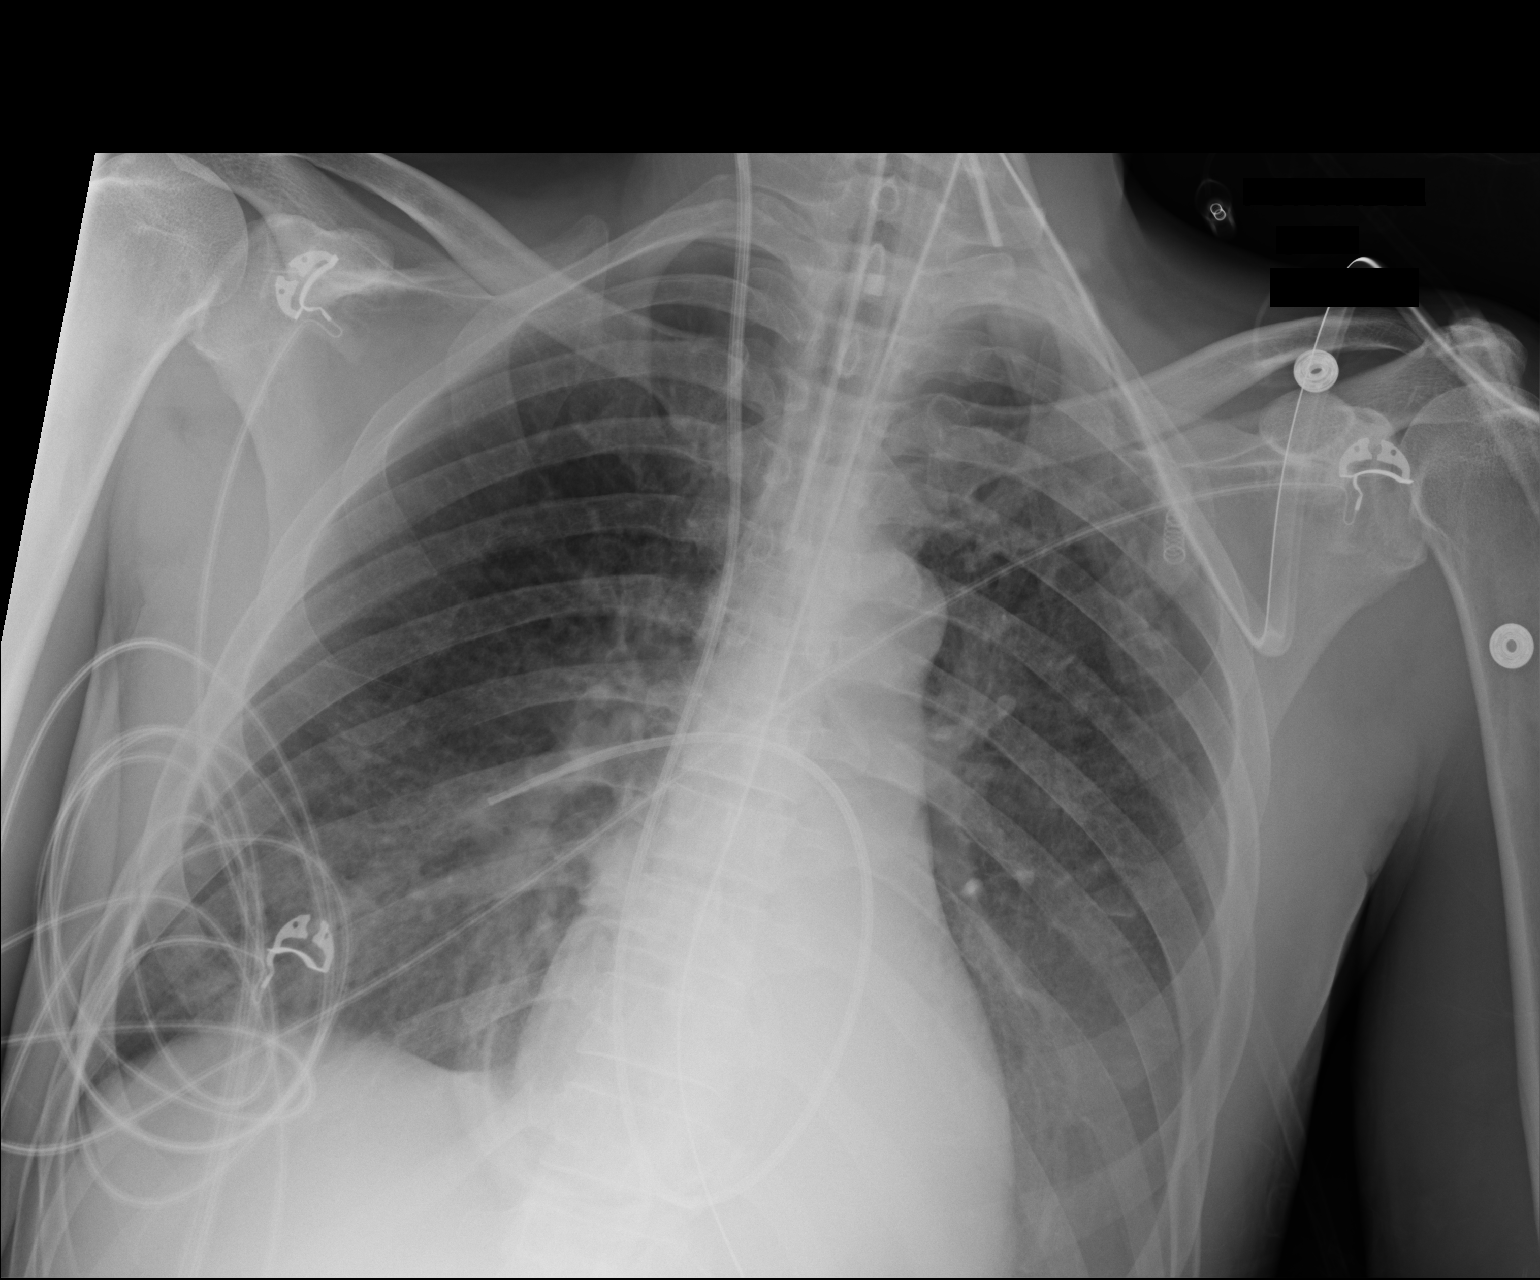

[AP (2 of 2)]
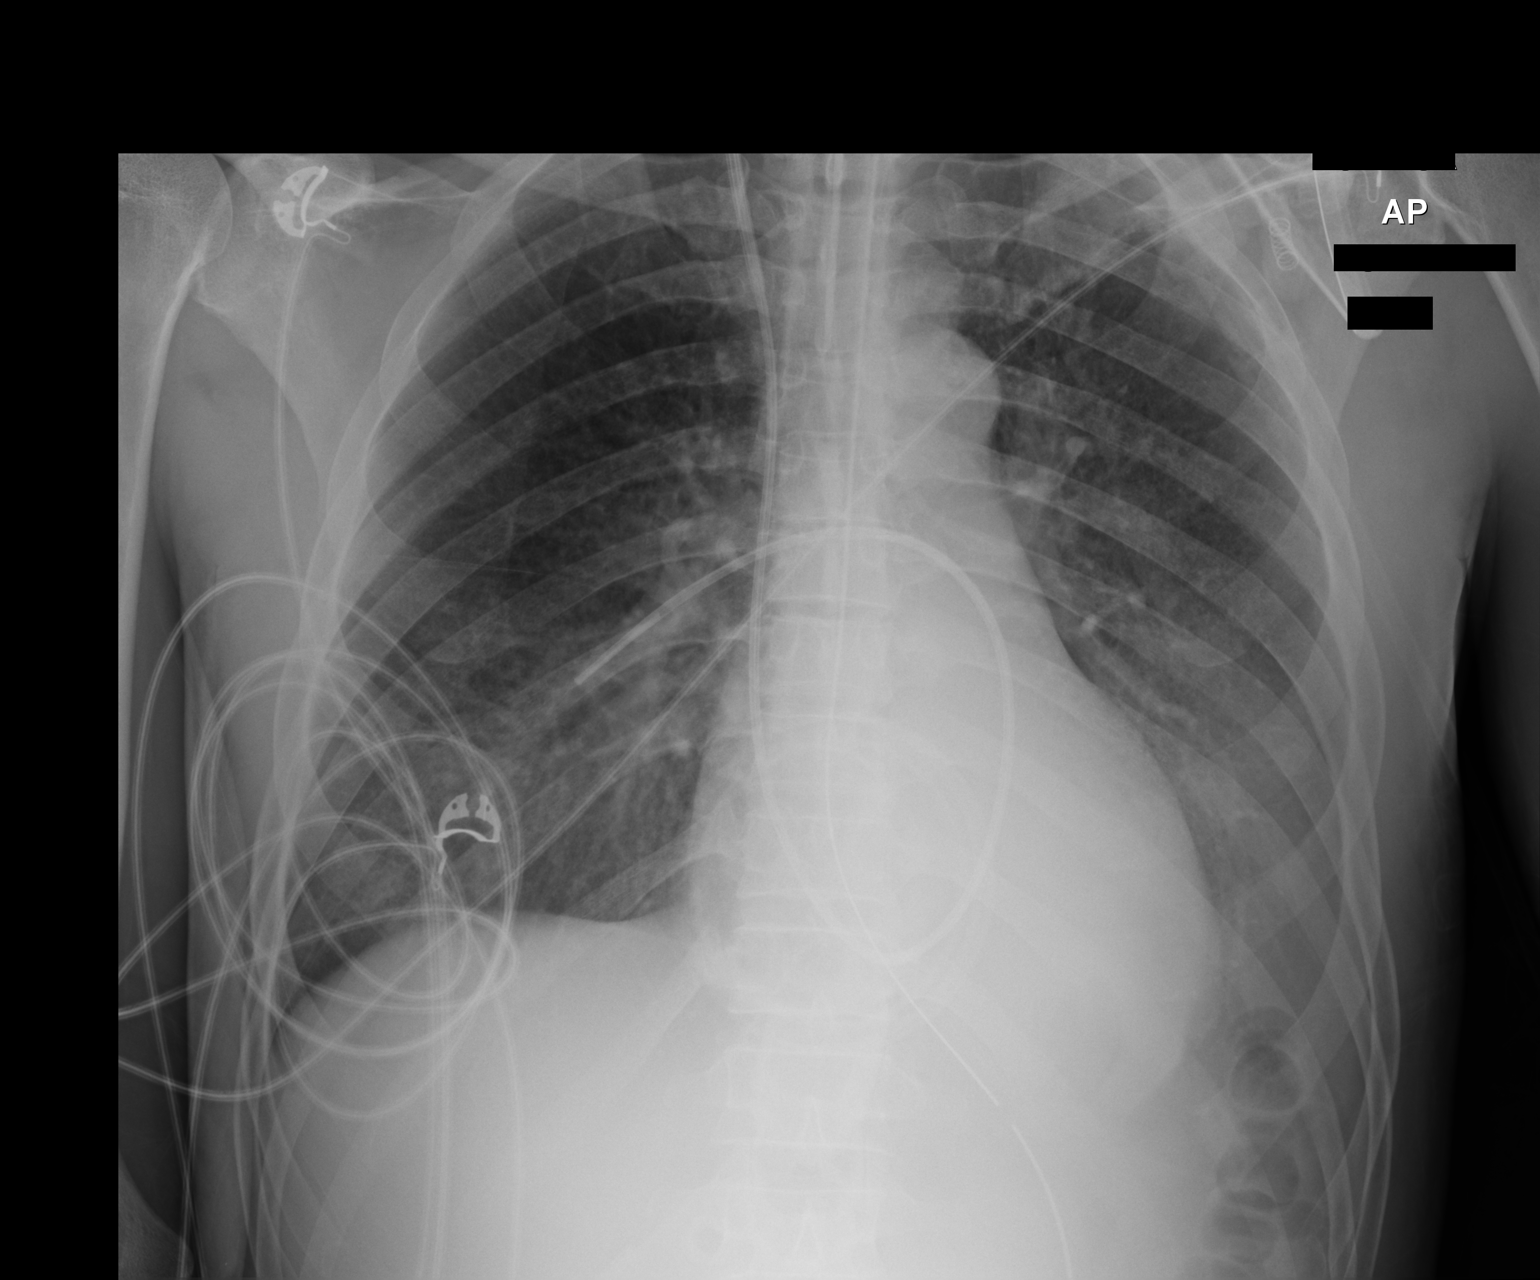

[2 of 2 positions shown; findings below may reference images not displayed]

FINDINGS: Right internal jugular Swan-Ganz catheter terminates in the right
infrahilar region. Enteric tube enters the stomach with the tip not
seen on this image. Endotracheal tube tip is 2.5 cm above the
carina. Stable cardiomediastinal silhouette with normal heart size.
No pneumothorax. Stable small left pleural effusion. No pulmonary
edema. Stable left lower lobe consolidation.
IMPRESSION: 1. Well-positioned endotracheal tube. Right internal jugular
Swan-Ganz catheter terminates over the right infrahilar region,
consider retracting 2-3 cm.
2. Stable small left pleural effusion.
3. Stable left lower lobe consolidation, favor atelectasis.

## 2017-07-12 IMAGING — CR DG CHEST 1V PORT
1 series · 1 of 1 positions shown · non-contrast
Comparison: 02/21/2016.

CLINICAL DATA: Respiratory failure.

EXAM:
PORTABLE CHEST 1 VIEW

[AP]
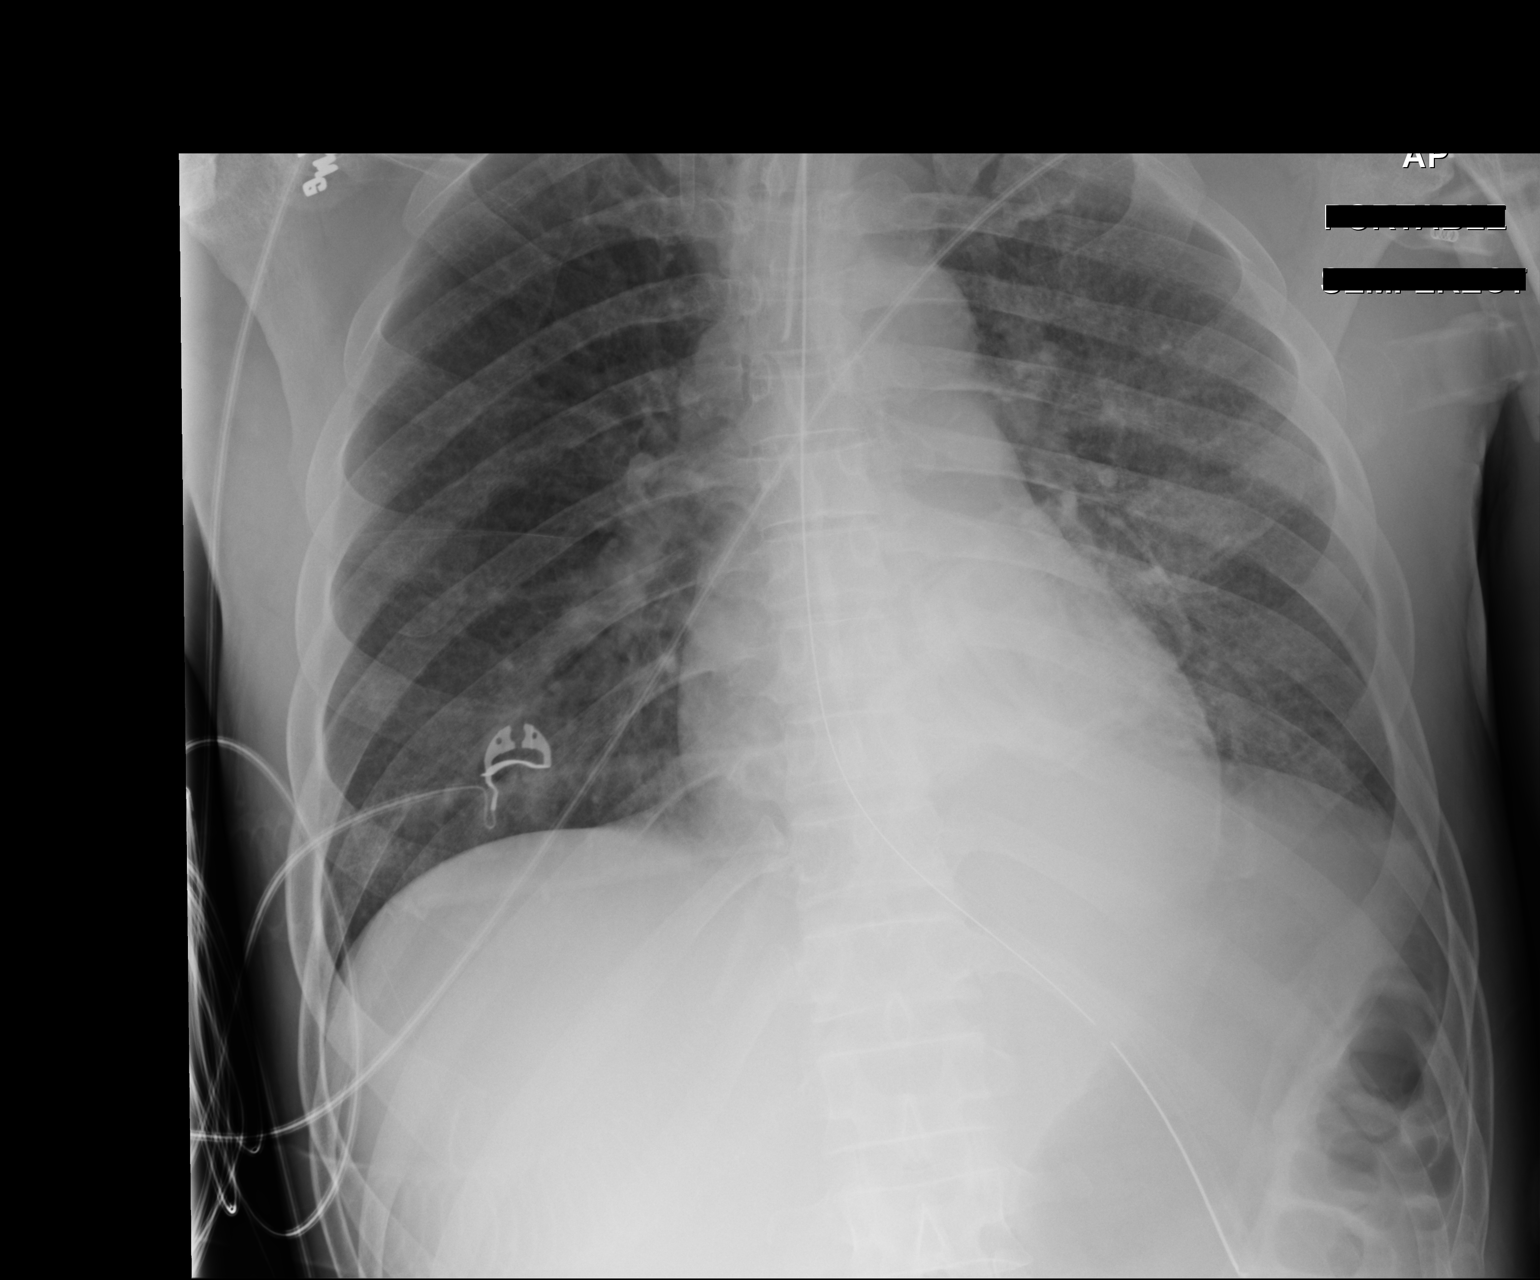

[1 of 1 positions shown; findings below may reference images not displayed]

FINDINGS: Endotracheal tube, NG tube, right IJ sheath in stable position.
Interim removal Swan-Ganz catheter. Heart size stable. Low lung
volumes with mild bibasilar atelectasis and or infiltrates. Interim
improvement from prior exam. Small left pleural effusion cannot be
excluded. No pneumothorax.
IMPRESSION: 1. Level of Swan-Ganz catheter. Endotracheal tube, NG tube, right IJ
sheath in stable position.
2. Low lung volumes with mild bibasilar atelectasis and/or
infiltrates. Interim improvement from prior exam. Small left pleural
effusion cannot be excluded.

## 2017-07-13 IMAGING — CR DG CHEST 1V PORT
2 series · 2 of 2 positions shown · non-contrast
Comparison: 02/22/2016.

CLINICAL DATA: Shortness of breath.

EXAM:
PORTABLE CHEST 1 VIEW

[AP (1 of 2)]
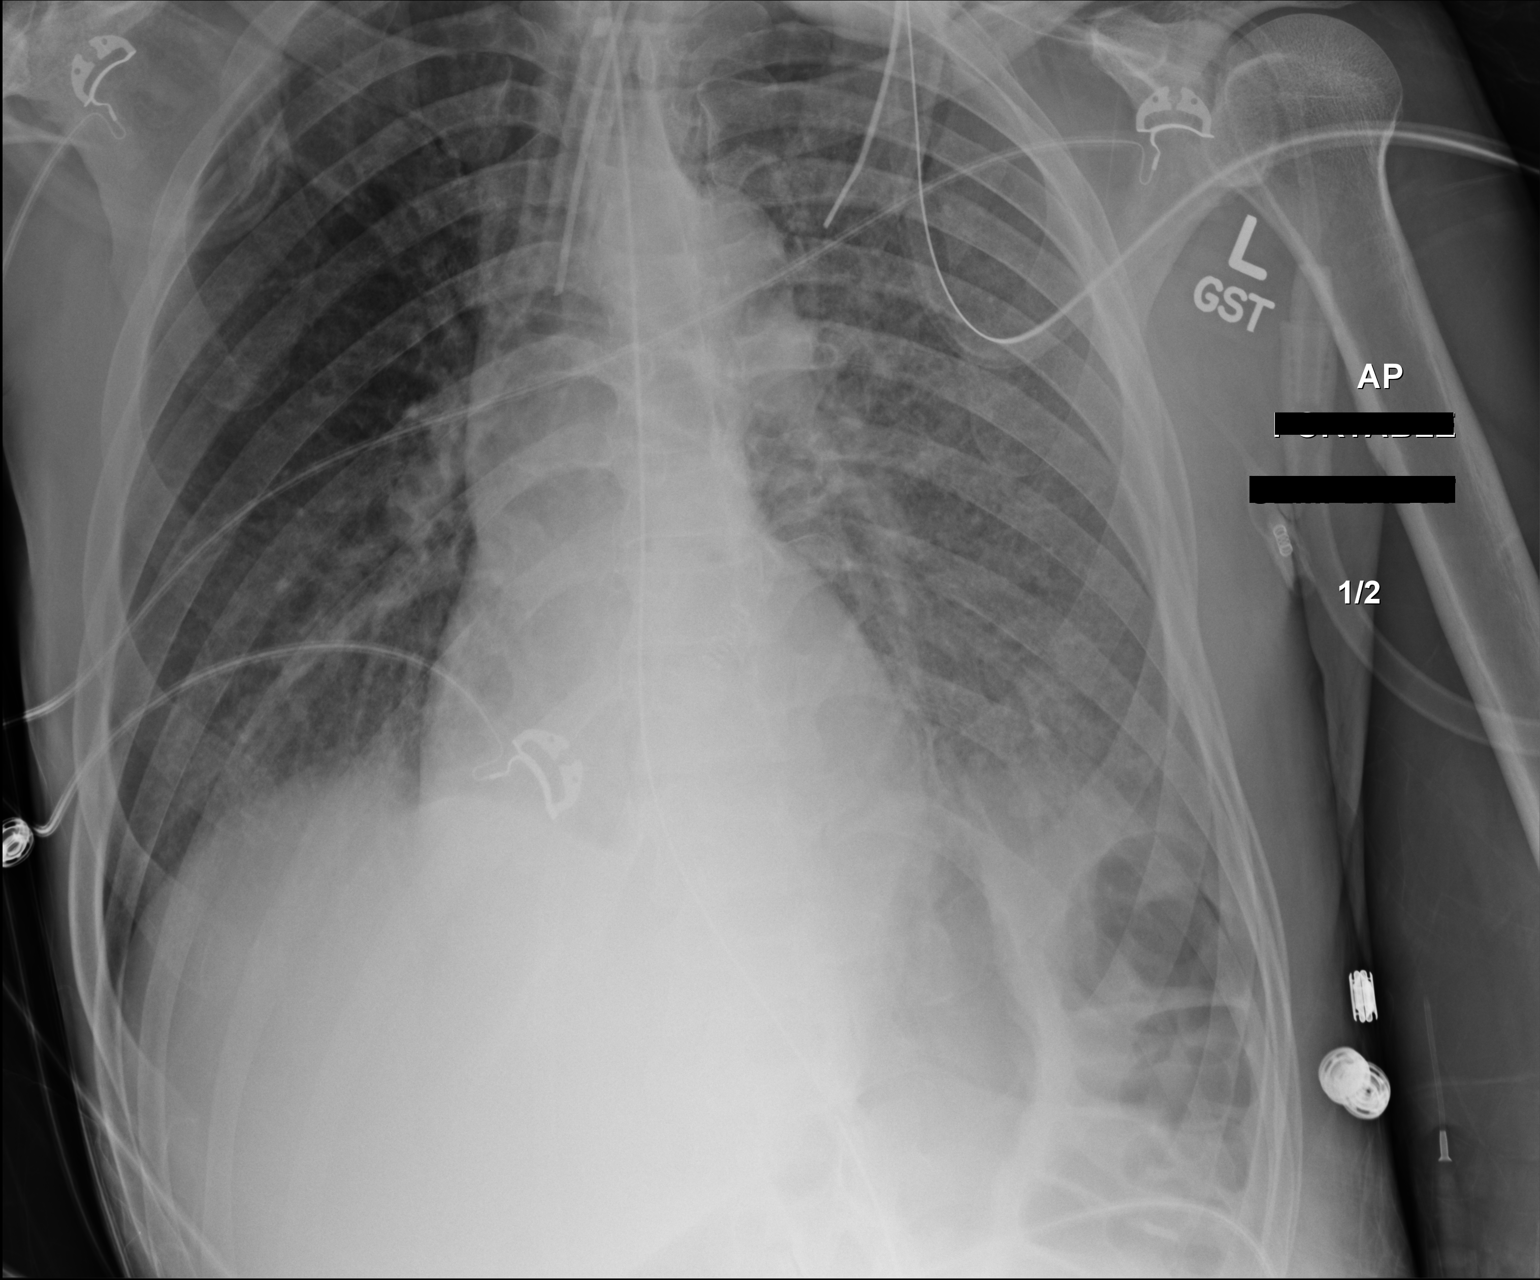

[AP (2 of 2)]
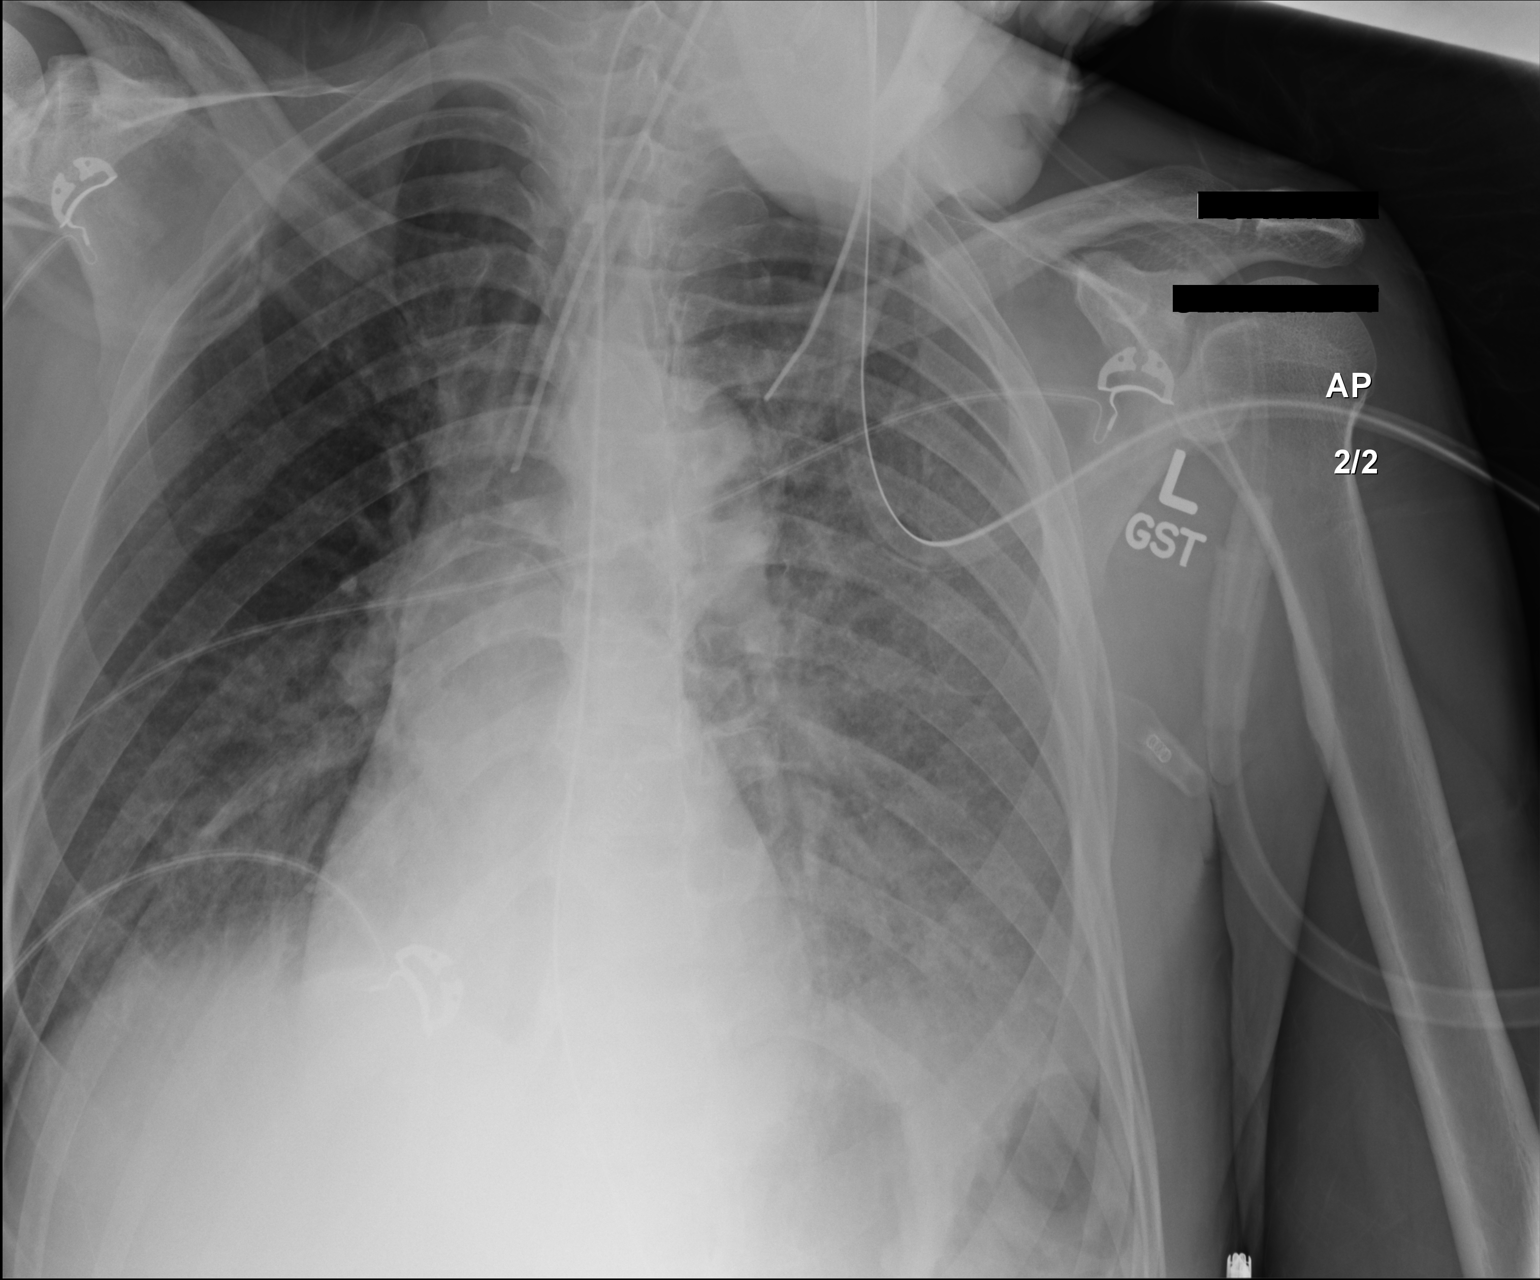

[2 of 2 positions shown; findings below may reference images not displayed]

FINDINGS: Endotracheal tube tip 1.8 cm above the carina, slight proximal
repositioning of 1 a 2 cm should be considered. NG tube noted with
tip below left hemidiaphragm. Heart size stable. Bilateral pulmonary
infiltrates, left side greater right noted. Small left pleural
effusion cannot be excluded. No pneumothorax .
IMPRESSION: 1. Endotracheal tube tip 1.8 cm above the carina, slight proximal
repositioning of 1 a 2 cm should be considered. NG tube in stable
position.
2. Diffuse left lung and right lower lobe pulmonary infiltrates
noted. These are new from prior exam. Small left pleural effusion
cannot be excluded .

## 2017-07-14 IMAGING — CR DG CHEST 1V PORT
1 series · 1 of 1 positions shown · non-contrast
Comparison: 02/23/2016

CLINICAL DATA: Endotracheal tube assessment.

EXAM:
PORTABLE CHEST 1 VIEW

[AP]
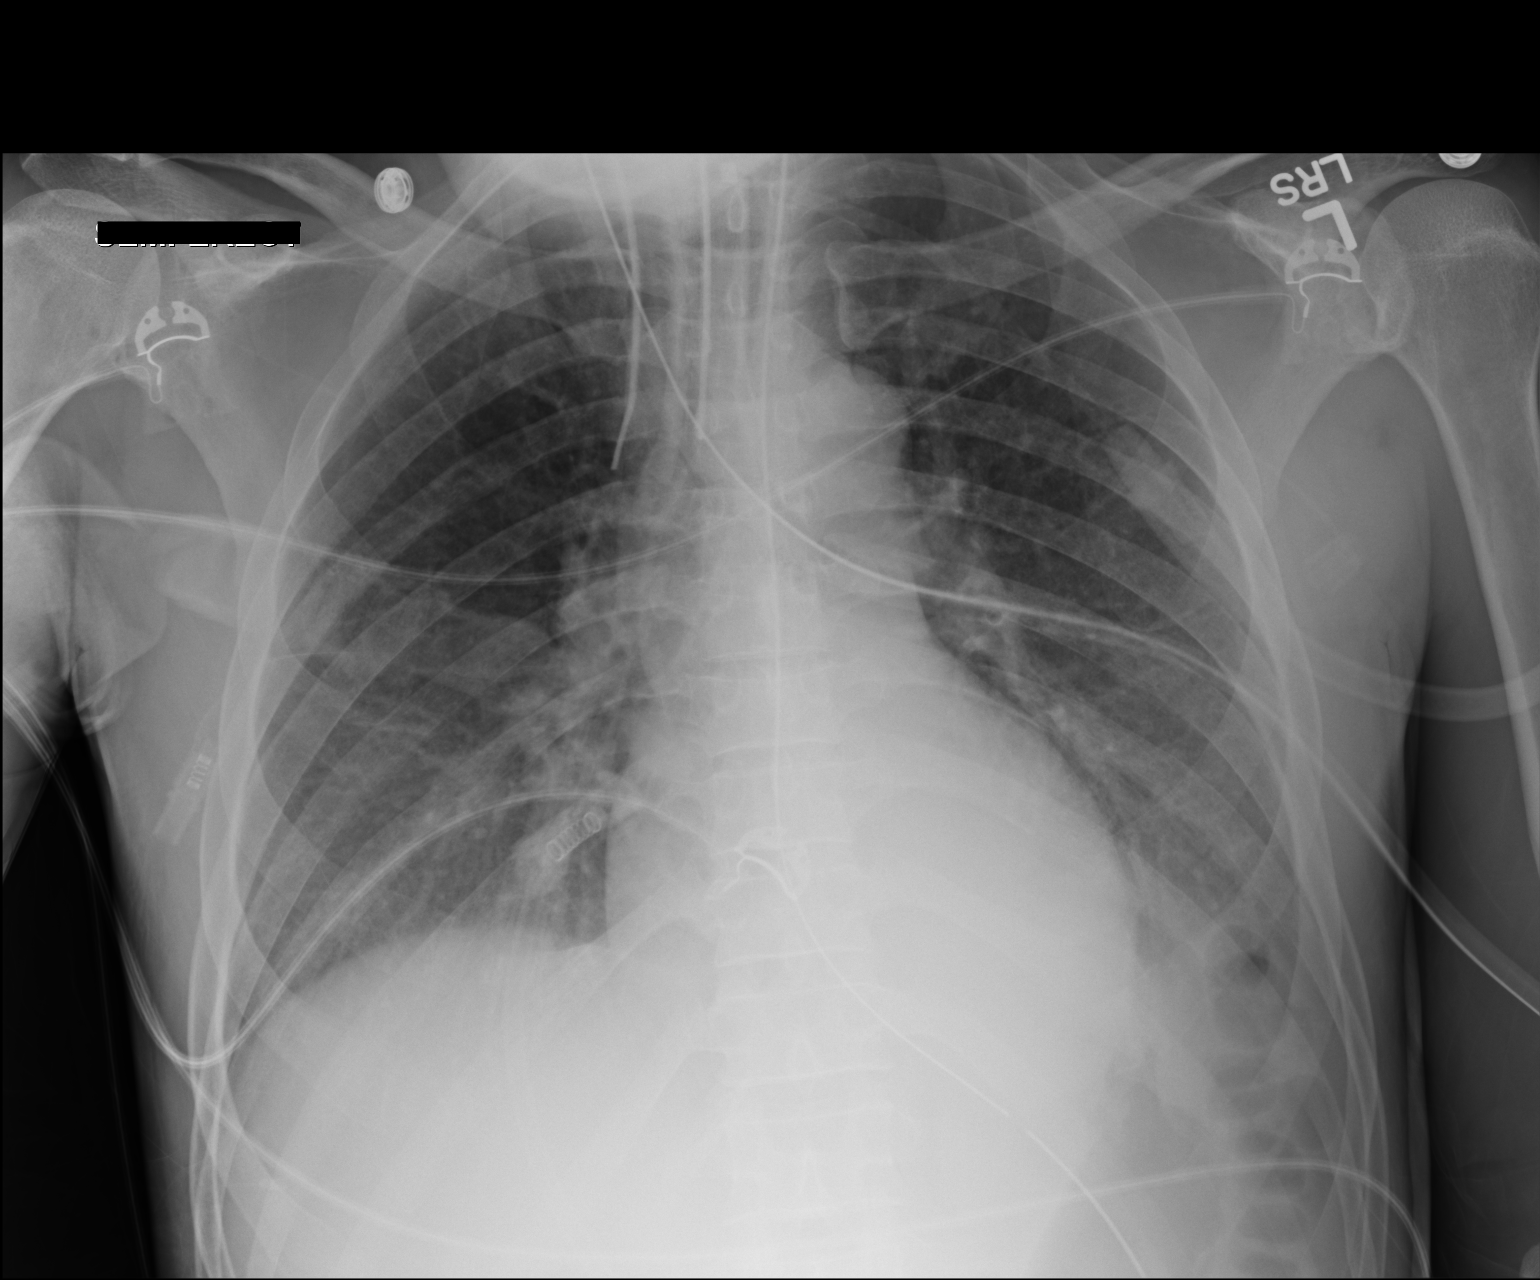

[1 of 1 positions shown; findings below may reference images not displayed]

FINDINGS: Endotracheal tube is present with tip measuring 2.5 cm above the
carinal. Enteric tube tip is off the field of view but below the
left hemidiaphragm. Shallow inspiration. Heart size and pulmonary
vascularity are normal. Mild serration of left hemidiaphragm
suggesting infiltration and/ or effusion. No pneumothorax.
IMPRESSION: Endotracheal tube tip measures 2.5 cm above the carina. Infiltration
and/ or effusion in the left lung base.

## 2017-07-15 IMAGING — CR DG CHEST 1V PORT
1 series · 1 of 1 positions shown · non-contrast
Comparison: Portable chest x-ray February 24, 2016

CLINICAL DATA: Intubated patient, encephalopathy and meningitis,
septic shock, CHF.

EXAM:
PORTABLE CHEST 1 VIEW

[AP]
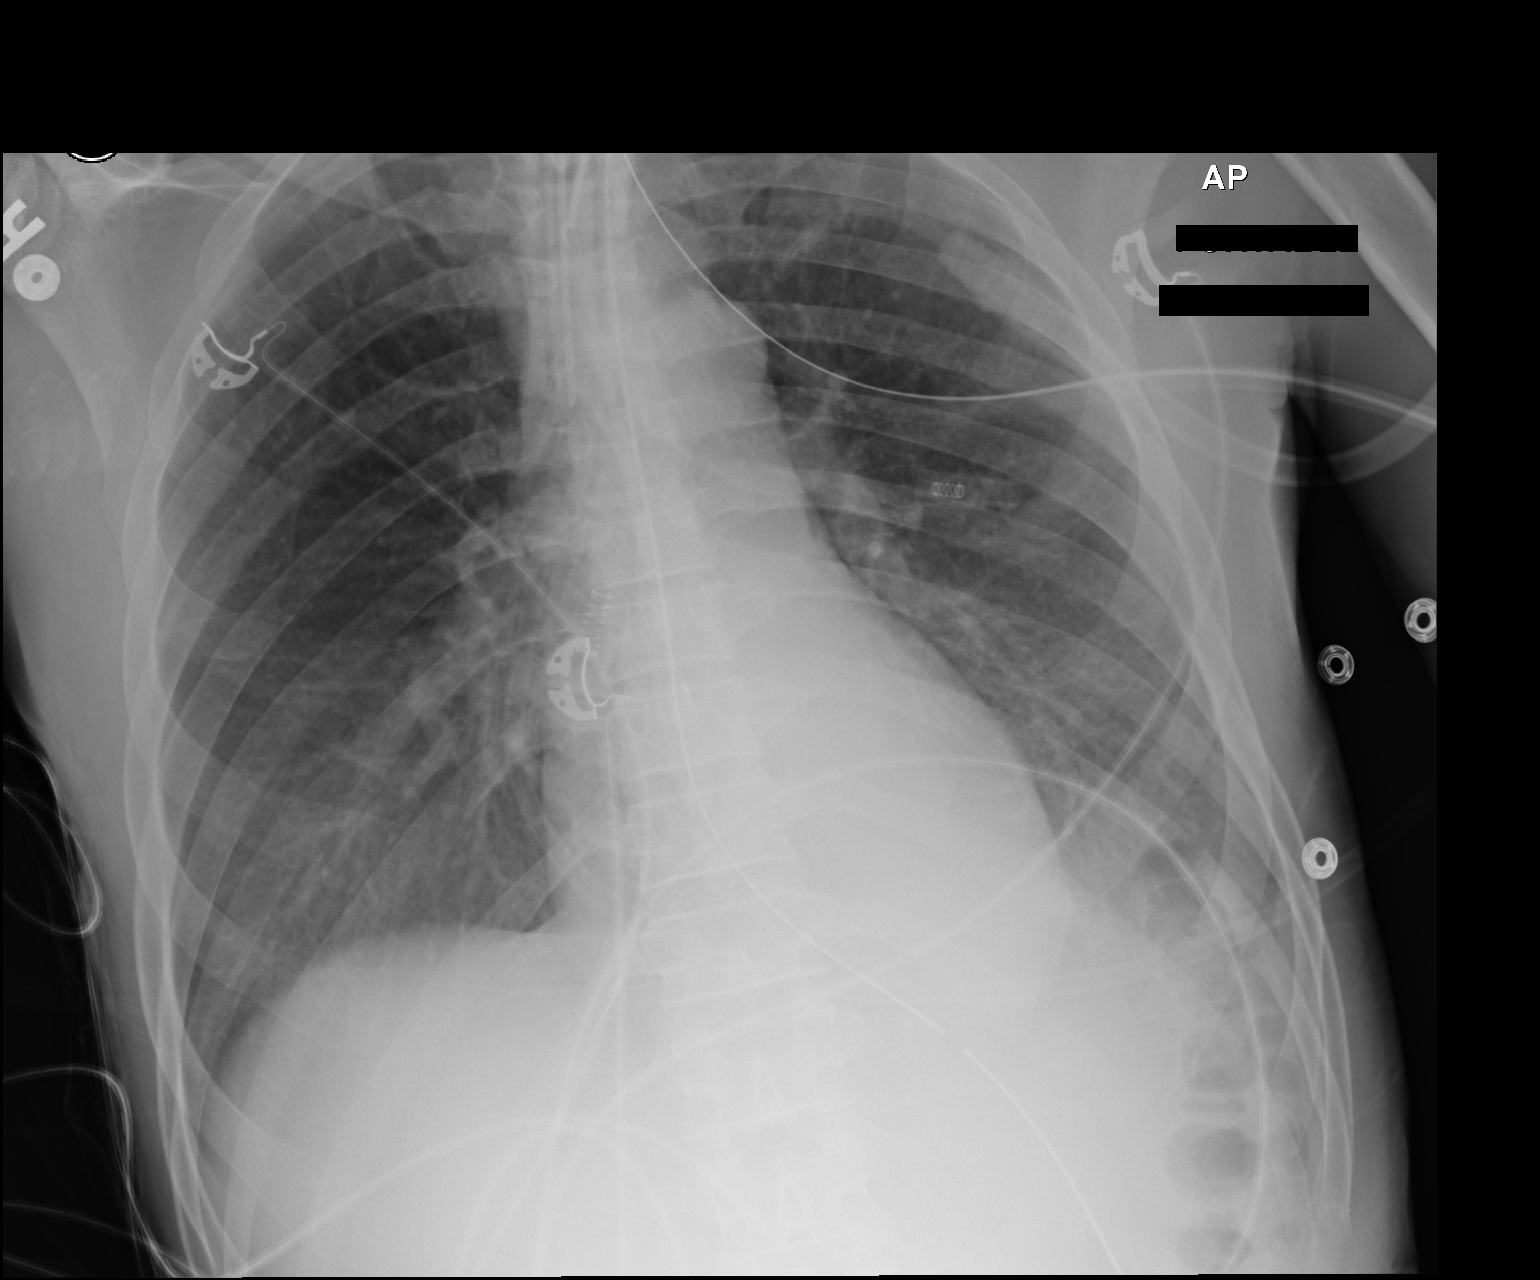

[1 of 1 positions shown; findings below may reference images not displayed]

FINDINGS: The lungs are well-expanded. There is persistent left lower lobe
atelectasis or pneumonia and trace left pleural effusion. The right
lung is clear. The heart and pulmonary vascularity are normal. The
endotracheal tube tip lies approximately 2 cm above the carina. The
esophagogastric tube tip projects below the inferior margin of the
image.
IMPRESSION: Somewhat low positioning of the endotracheal tube with. Withdrawal
by 2 cm is recommended to avoid accidental mainstem bronchus
intubation. Stable left lower lobe atelectasis and small left
pleural effusion.

## 2017-07-16 IMAGING — CR DG ABD PORTABLE 1V
1 series · 1 of 1 positions shown · non-contrast
Comparison: None.

CLINICAL DATA: Nasogastric tube placement

EXAM:
PORTABLE ABDOMEN - 1 VIEW

[AP]
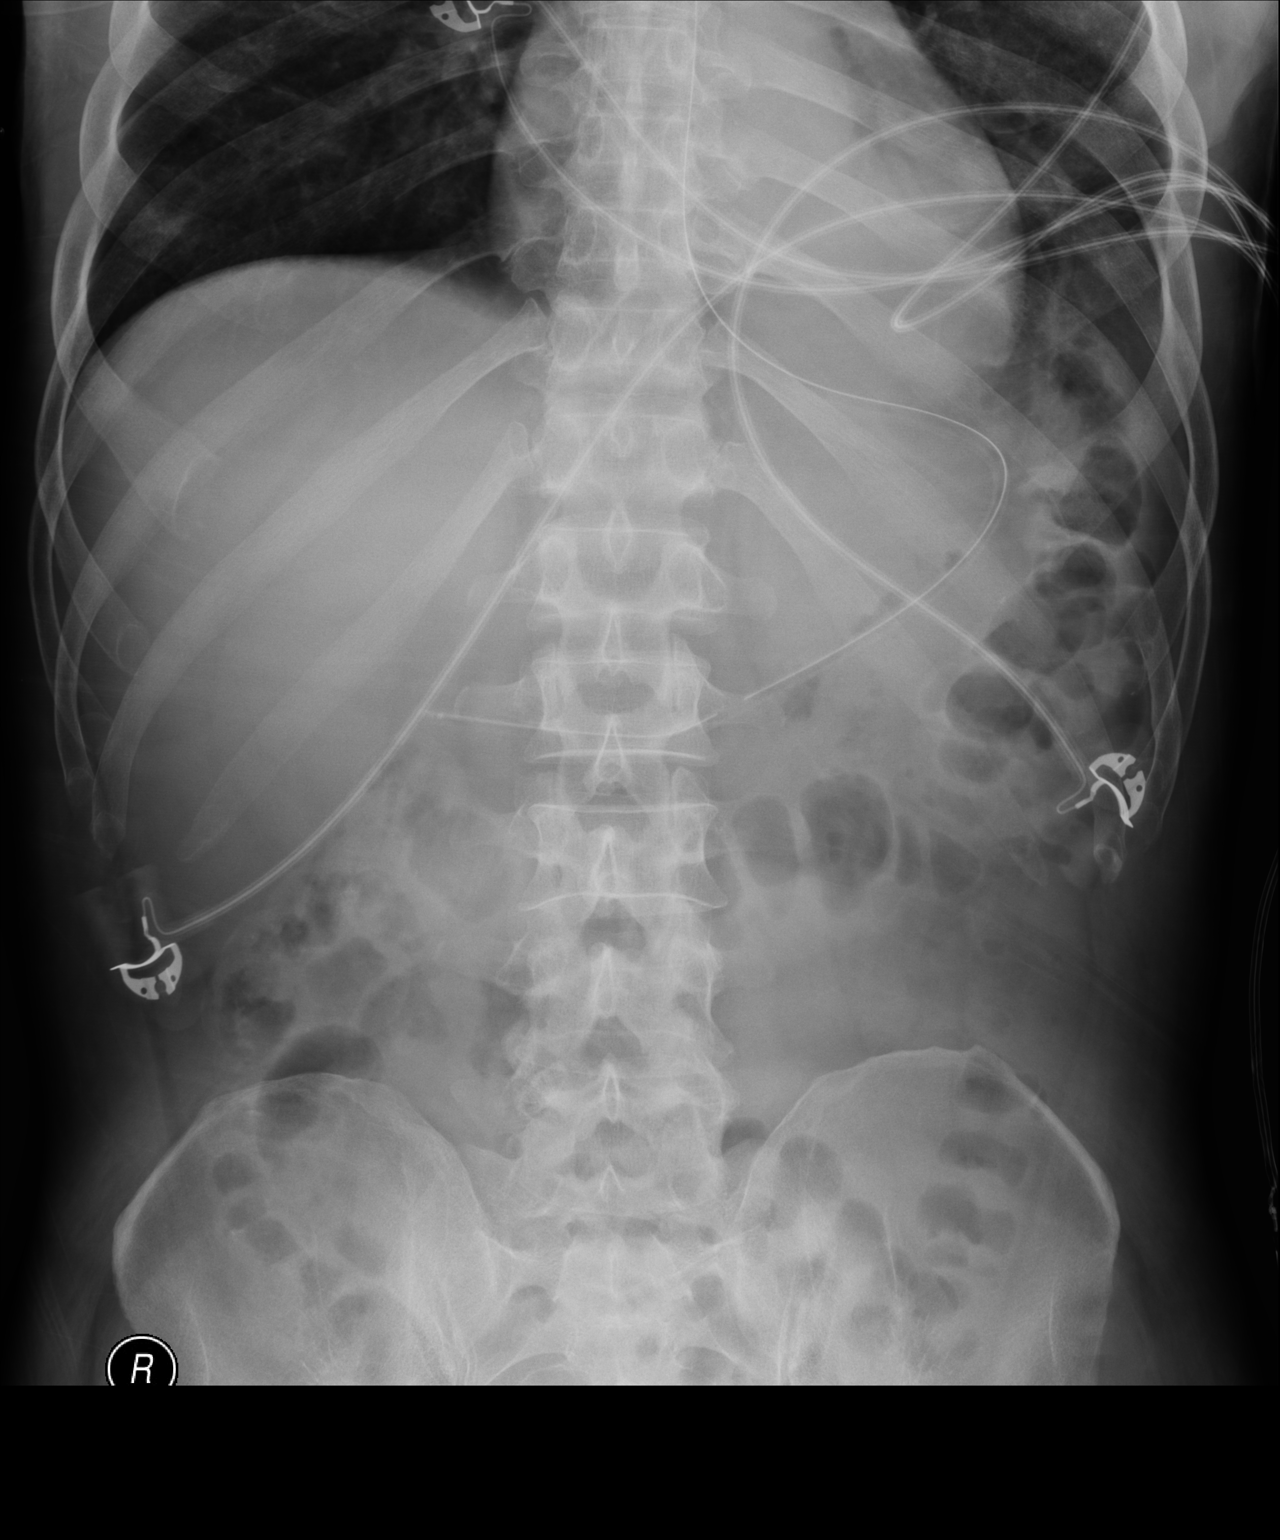

[1 of 1 positions shown; findings below may reference images not displayed]

FINDINGS: Nasogastric tube tip is near the pylorus with the side port in the
gastric antrum region. Bowel gas pattern is unremarkable. No free
air evident. There is consolidation in the medial left lung base.
IMPRESSION: Nasogastric tube tip and side port in distal stomach. Bowel gas
pattern unremarkable. Medial left lung base consolidation.

## 2017-07-16 IMAGING — CR DG CHEST 1V PORT
1 series · 1 of 1 positions shown · non-contrast
Comparison: 02/25/2016

CLINICAL DATA: ARDS

EXAM:
PORTABLE CHEST 1 VIEW

[AP]
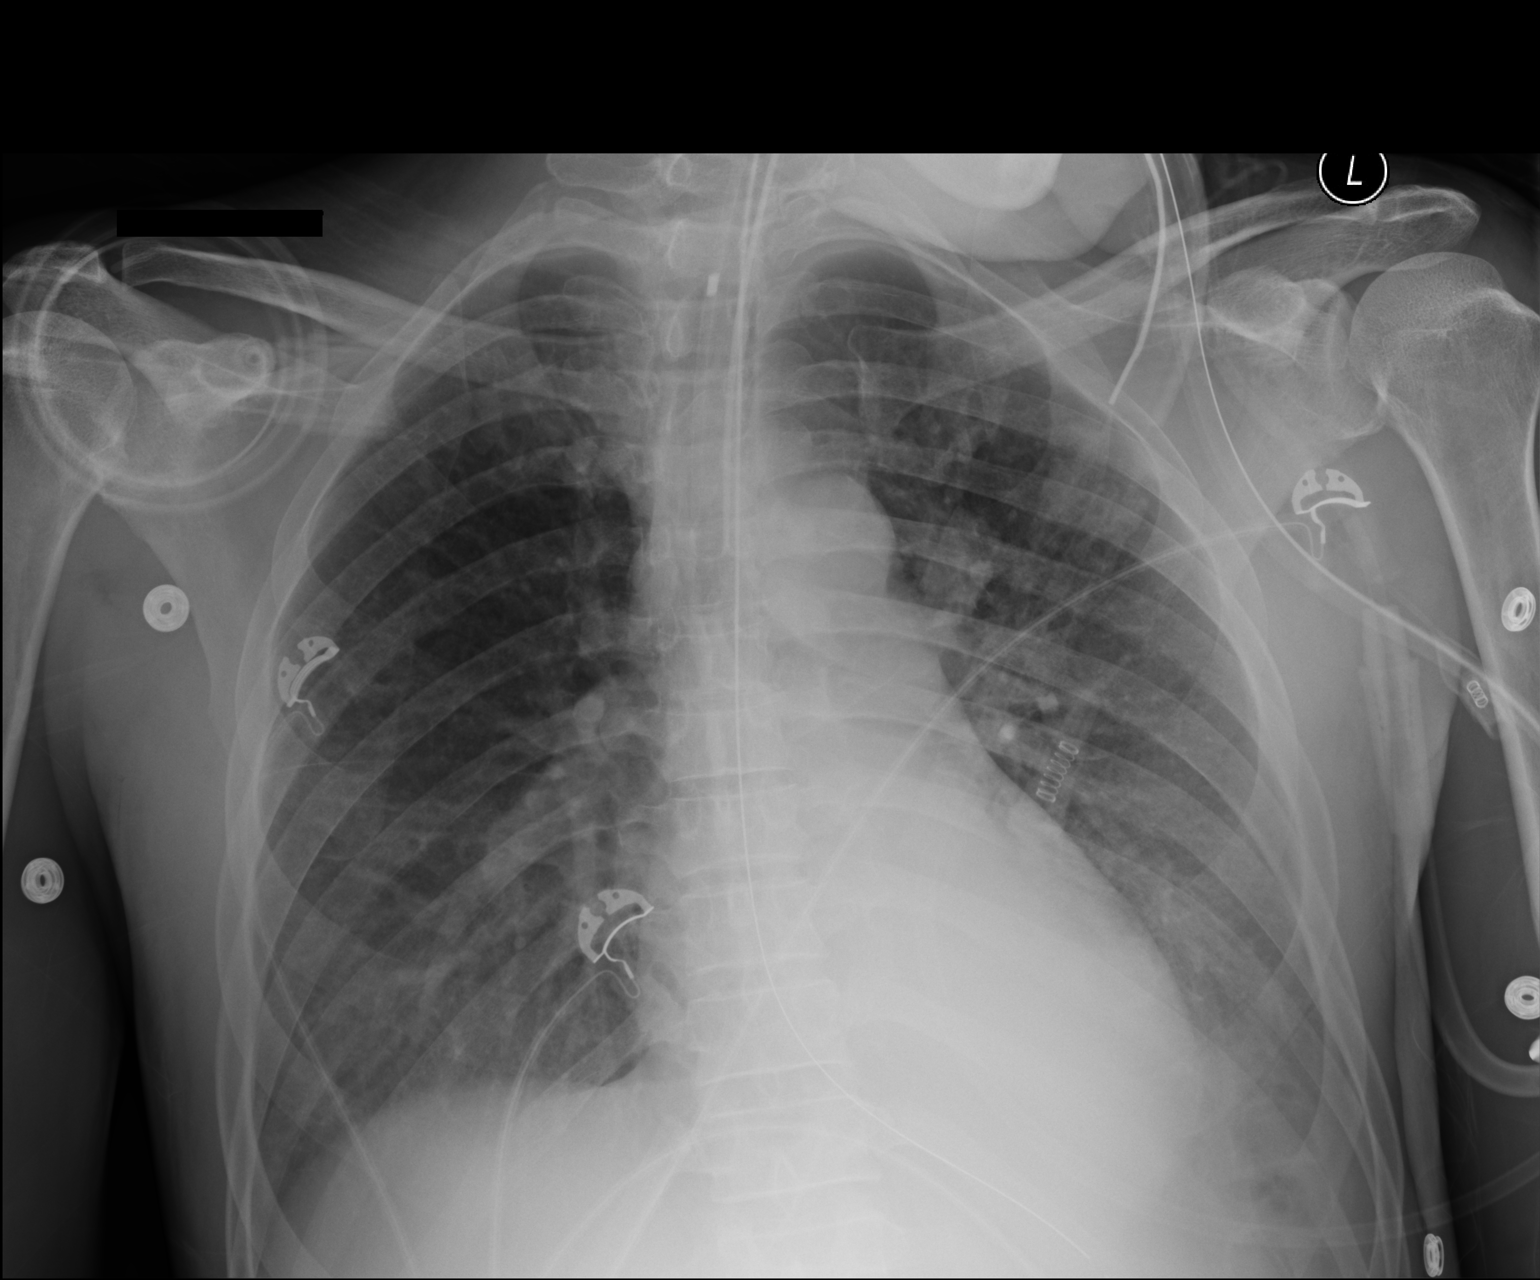

[1 of 1 positions shown; findings below may reference images not displayed]

FINDINGS: Cardiac shadow is stable. A nasogastric catheter and endotracheal
tube are again seen in stable position. The lungs are well aerated
bilaterally. Persistent left retrocardiac atelectasis and small
effusion are noted. No new focal abnormality is seen.
IMPRESSION: Stable changes in the left base.

## 2017-07-19 IMAGING — CR DG CHEST 1V PORT
1 series · 1 of 1 positions shown · non-contrast
Comparison: Prior chest x-ray 02/28/2016

CLINICAL DATA: 49-year-old male with acute respiratory failure

EXAM:
PORTABLE CHEST 1 VIEW

[AP]
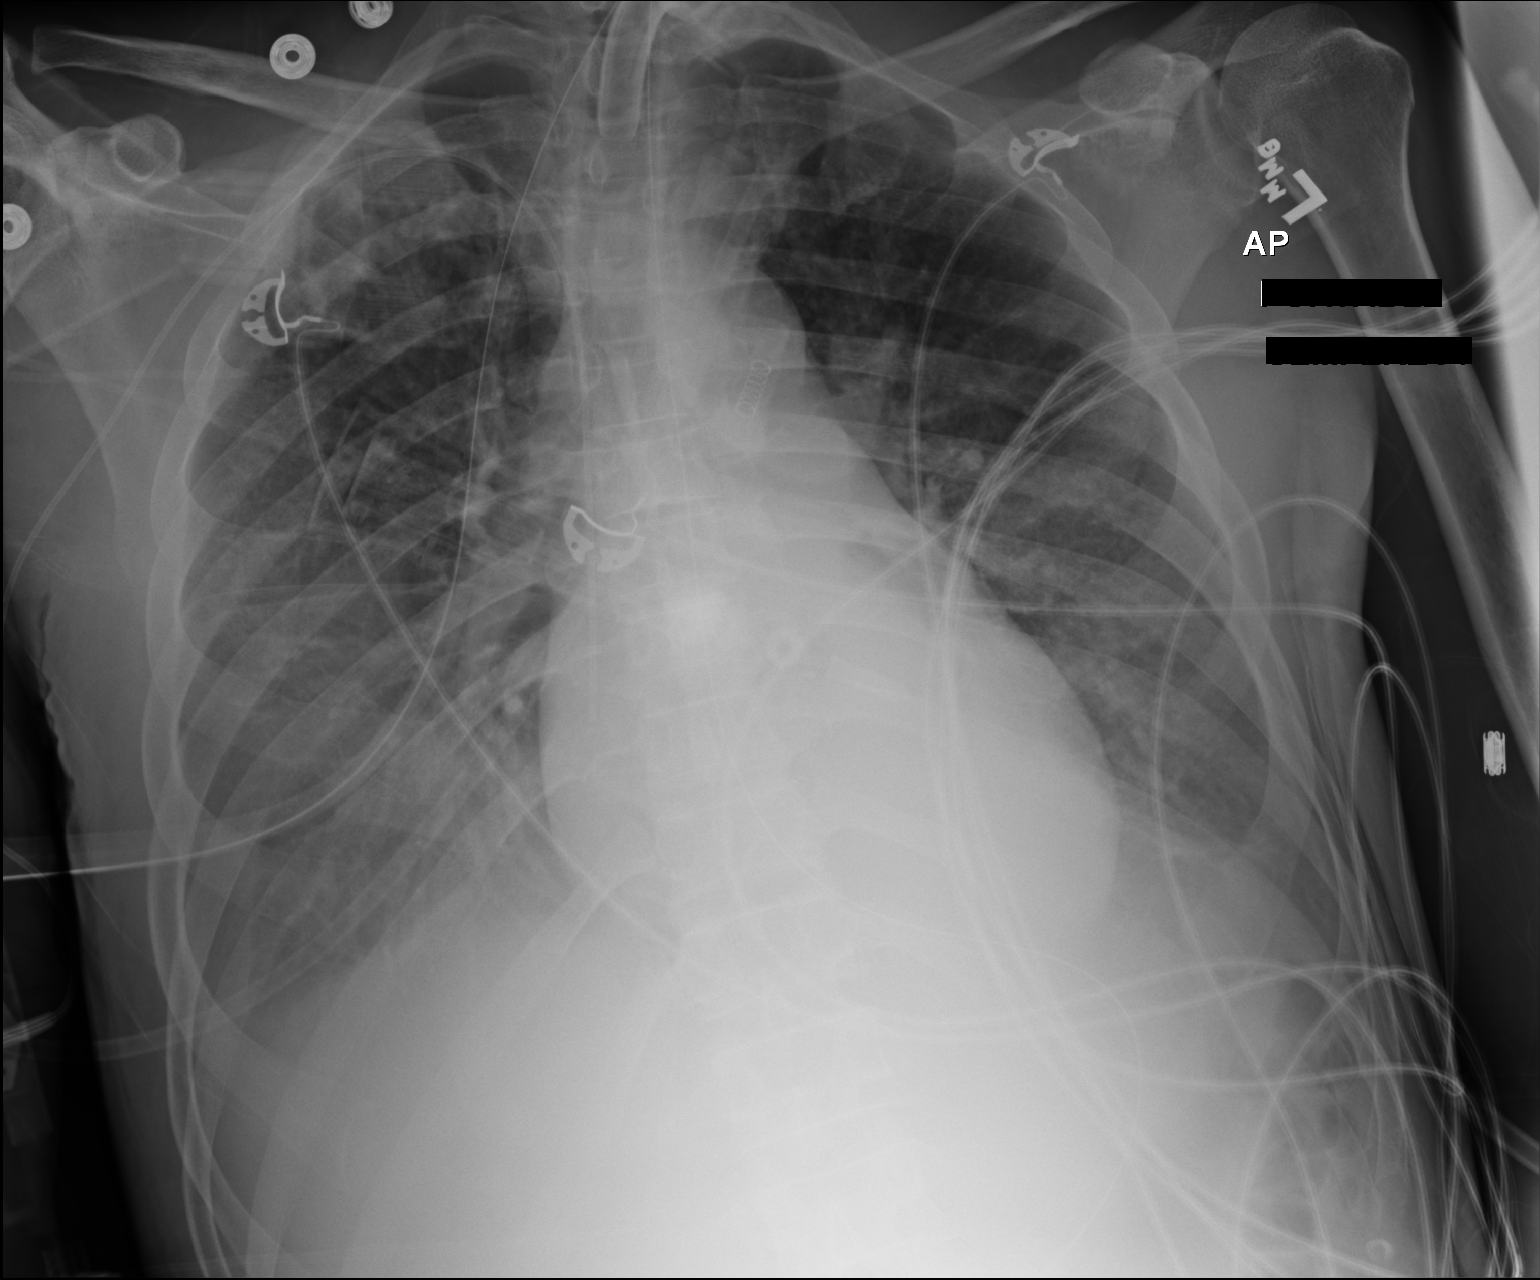

[1 of 1 positions shown; findings below may reference images not displayed]

FINDINGS: The tracheostomy tube remains midline and at the level of the
clavicles. A right upper extremity approach PICC terminates in good
position overlying the superior cavoatrial junction. A nasogastric
tube is present. The tip lies off the field of view but below the
diaphragm and presumably within the stomach. Stable cardiac and
mediastinal contours. Bilateral right larger than left layering
pleural effusions have slightly enlarged. There is associated
bibasilar atelectasis. Background of mild pulmonary vascular
congestion. No pneumothorax. No acute osseous abnormality.
IMPRESSION: 1. Slight interval progression of right greater than left layering
pleural effusions and associated bibasilar atelectasis.
2. Stable and satisfactory support apparatus.

## 2017-07-22 IMAGING — CR DG CHEST 1V PORT
1 series · 1 of 1 positions shown · non-contrast
Comparison: 02/29/2016.

CLINICAL DATA: Shortness of breath.

EXAM:
PORTABLE CHEST 1 VIEW

[AP]
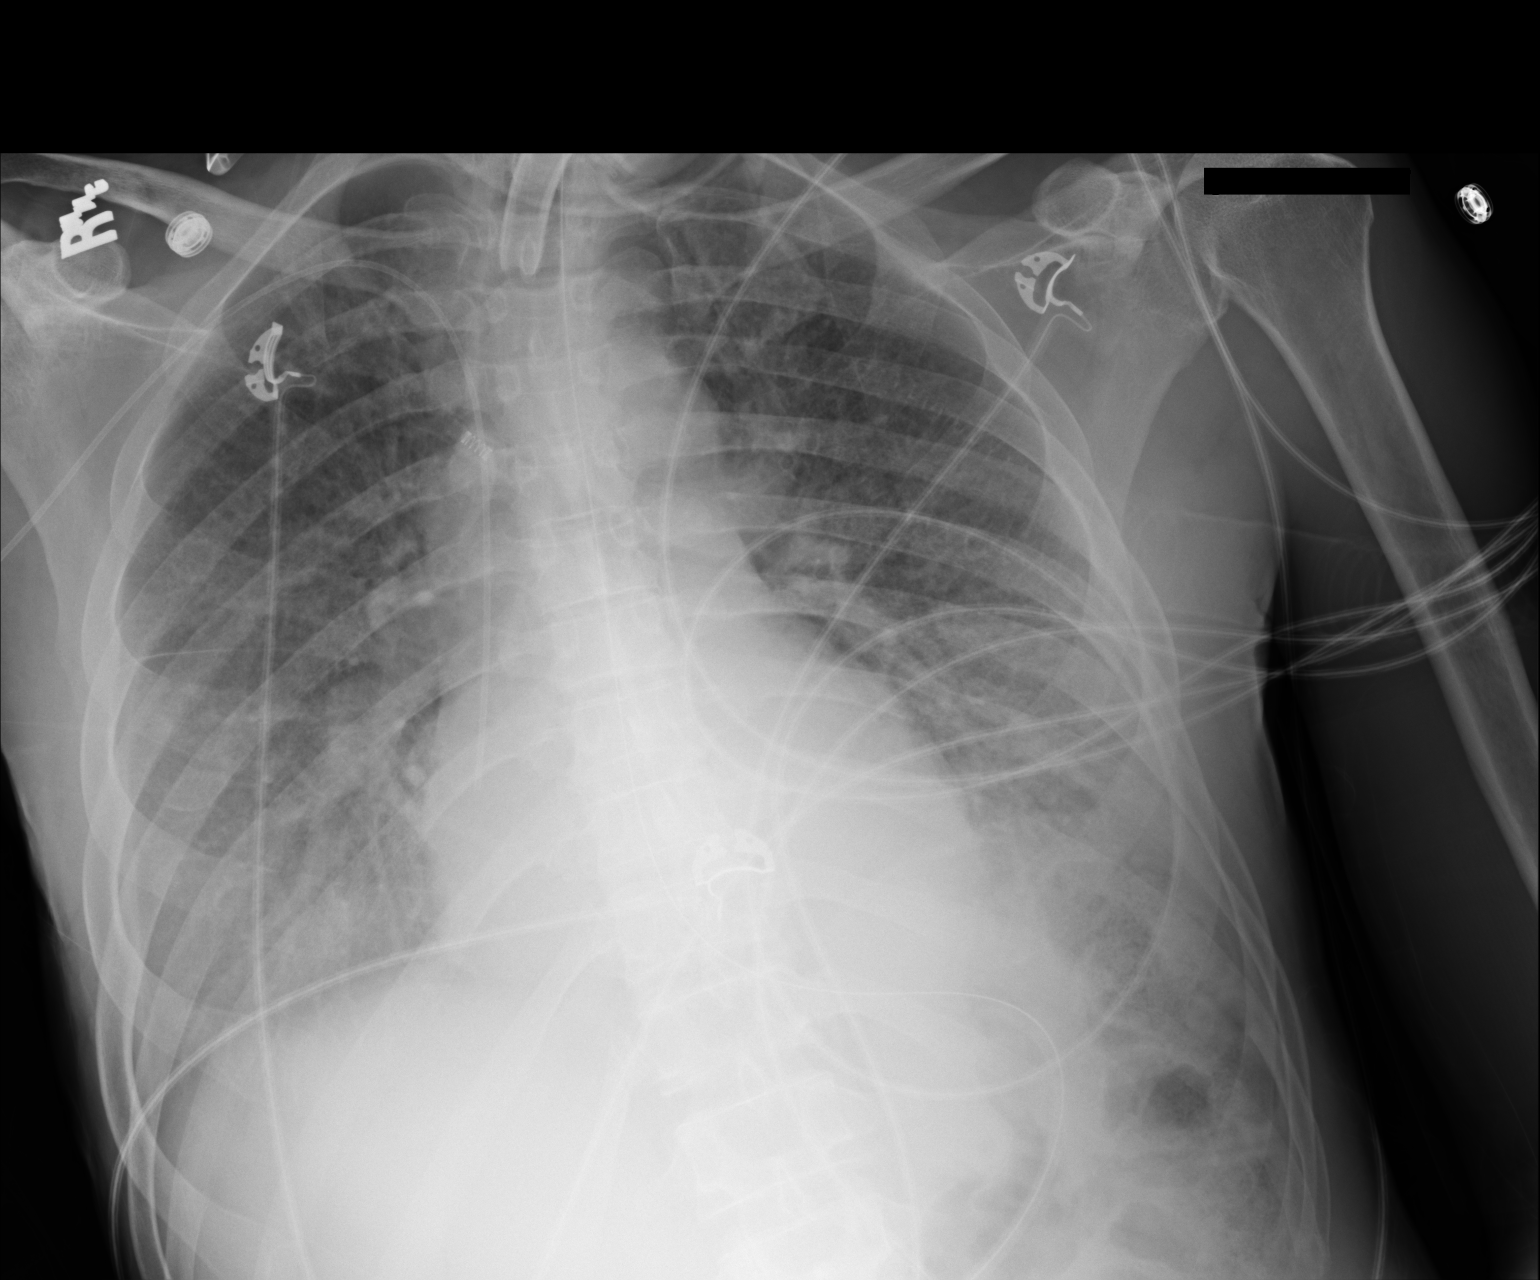

[1 of 1 positions shown; findings below may reference images not displayed]

FINDINGS: Tracheostomy tube, NG tube, right PICC line in stable position.
Mediastinum hilar structures normal. Heart size stable. Diffuse
bilateral airspace disease. Basilar atelectasis. Small bilateral
pleural effusions.
IMPRESSION: 1. Lines and tubes in stable position.

2. Bilateral airspace disease consistent with bilateral pulmonary
infiltrates/edema. Low lung volumes with basilar atelectasis. Small
bilateral pleural effusions. Similar findings noted on prior exam.

## 2017-07-24 IMAGING — CR DG CHEST 1V
2 series · 2 of 2 positions shown · non-contrast
Comparison: March 03, 2016.

CLINICAL DATA: Hypoxia.

EXAM:
CHEST 1 VIEW

[AP (1 of 2)]
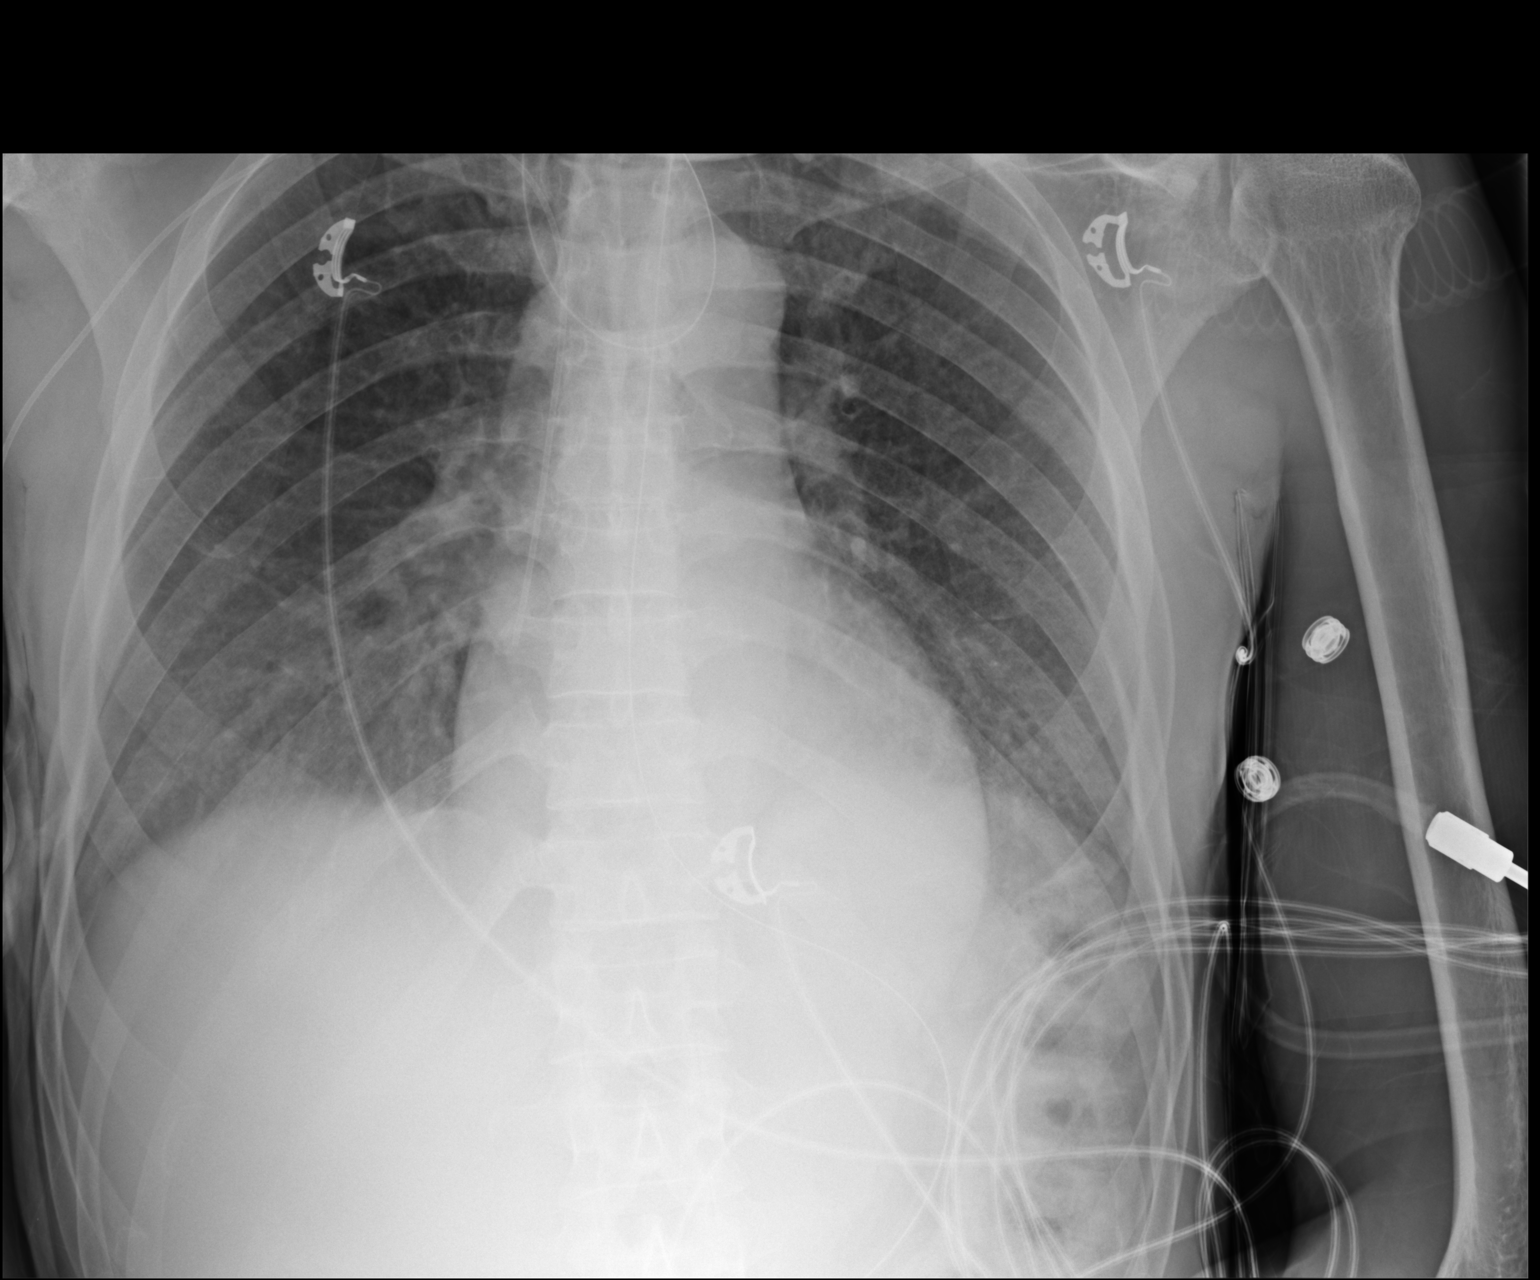

[AP (2 of 2)]
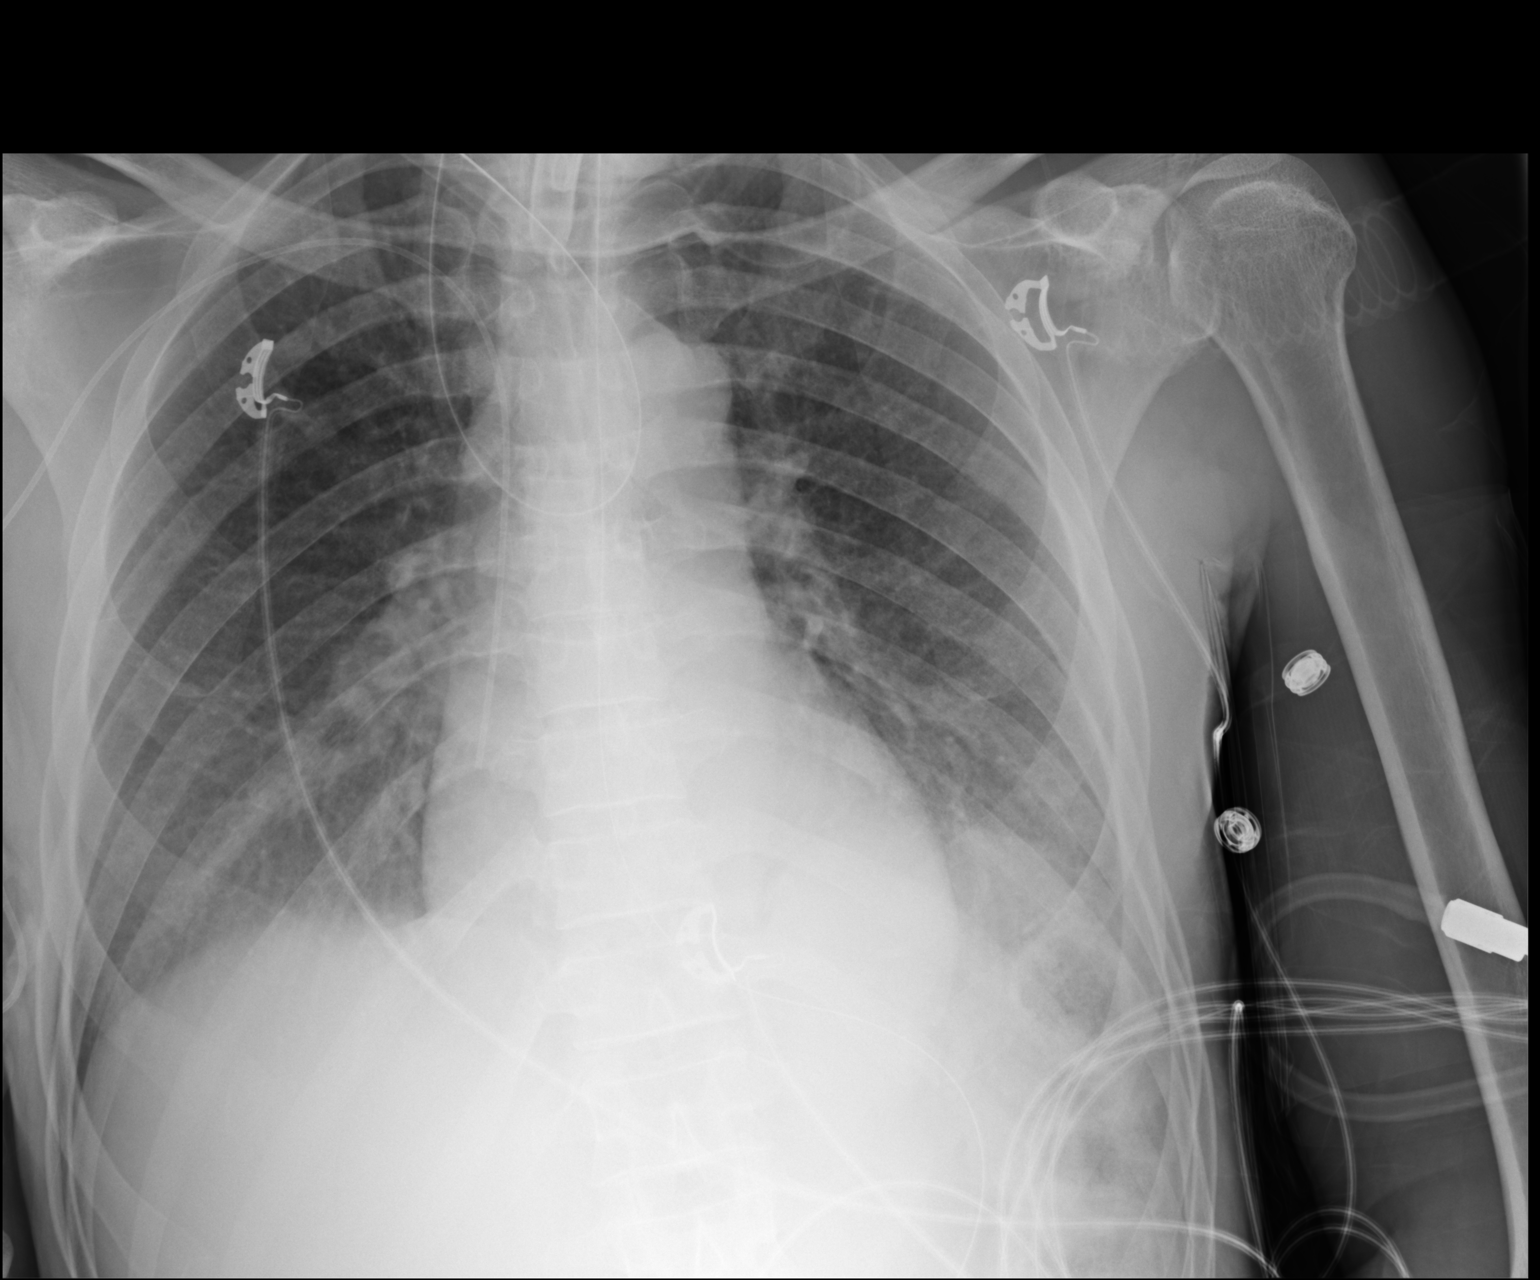

[2 of 2 positions shown; findings below may reference images not displayed]

FINDINGS: Stable cardiomediastinal silhouette. Tracheostomy and nasogastric
tubes are unchanged in position. Right-sided PICC line is stable. No
pneumothorax is noted. Improved bibasilar opacities are noted
consistent with improving pulmonary edema. Bony thorax is
unremarkable.
IMPRESSION: Stable support apparatus. Improved bibasilar opacities consistent
with improving edema.

## 2017-08-08 IMAGING — CT CT HEAD W/O CM
1 of 2 series · 15 of 30 positions shown, 19 images · non-contrast
Comparison: Brain CT 02/17/2016; 02/16/2016.

CLINICAL DATA: Patient status post witnessed fall. Abrasion to the
left periorbital soft tissues.

EXAM:
CT HEAD WITHOUT CONTRAST
CT CERVICAL SPINE WITHOUT CONTRAST
TECHNIQUE: Multidetector CT imaging of the head and cervical spine was
performed following the standard protocol without intravenous
contrast. Multiplanar CT image reconstructions of the cervical spine
were also generated.

[Series 3: head 2.0 h70h · axial · 0.45mm/px · z∈[-100,+46]mm · 15 of 83 slices shown, 19 images]
[im 5/83  brain]
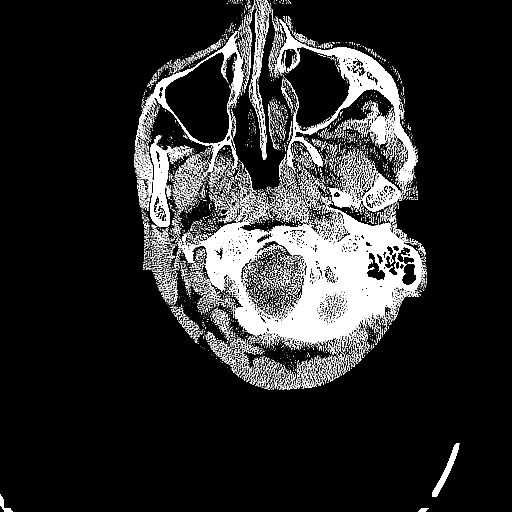
[im 5/83  bone]
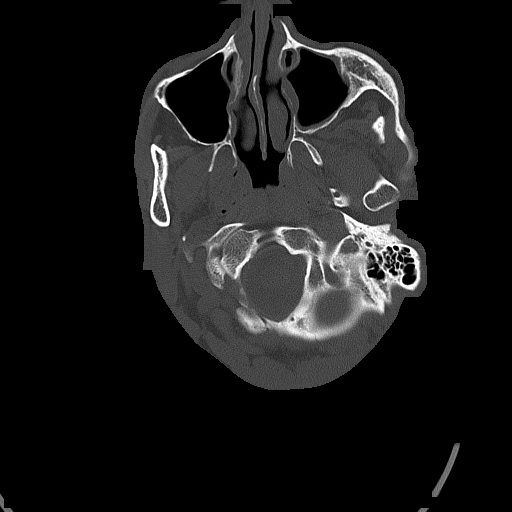
[im 9/83  brain]
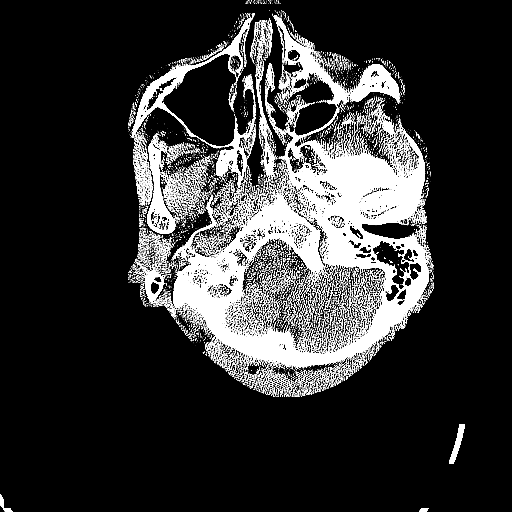
[im 17/83  brain]
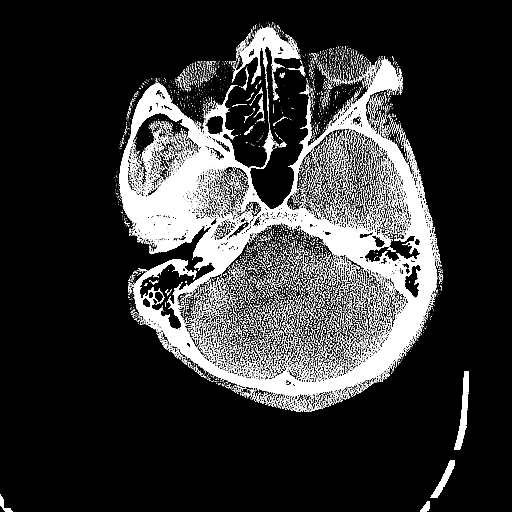
[im 21/83  brain]
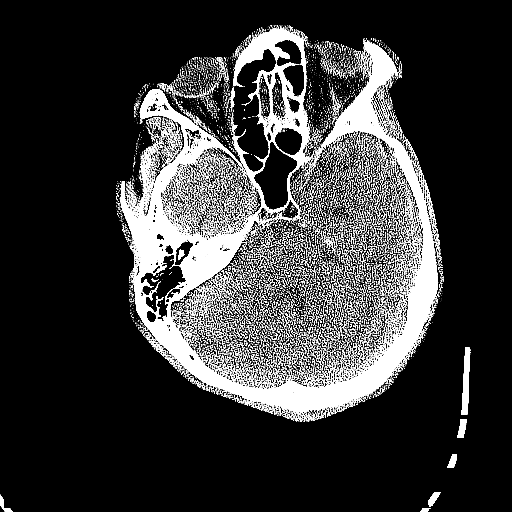
[im 25/83  brain]
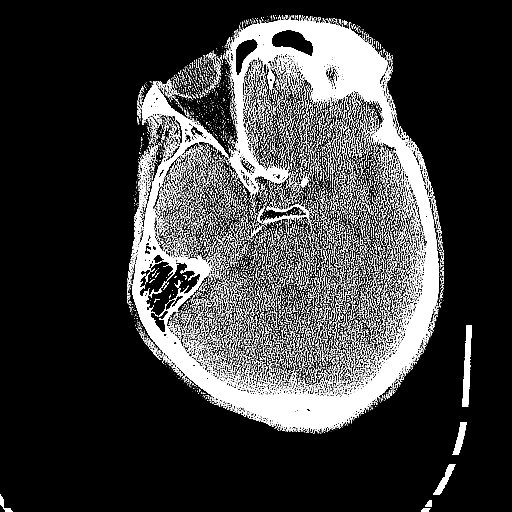
[im 25/83  bone]
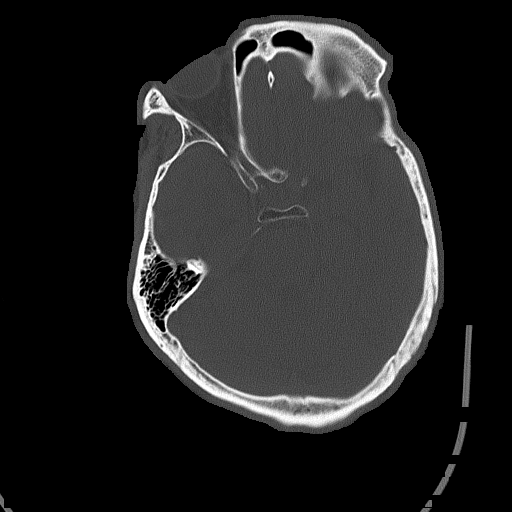
[im 29/83  brain]
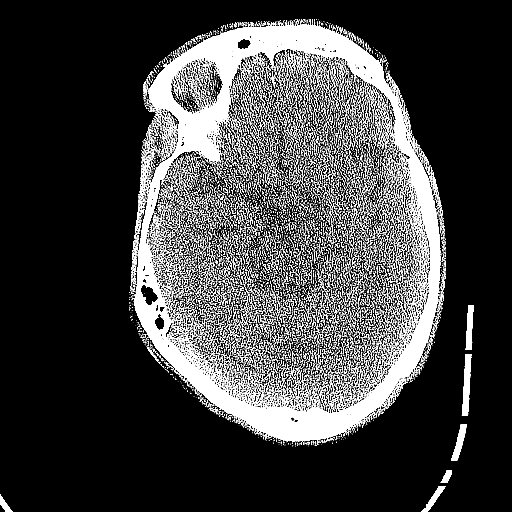
[im 37/83  brain]
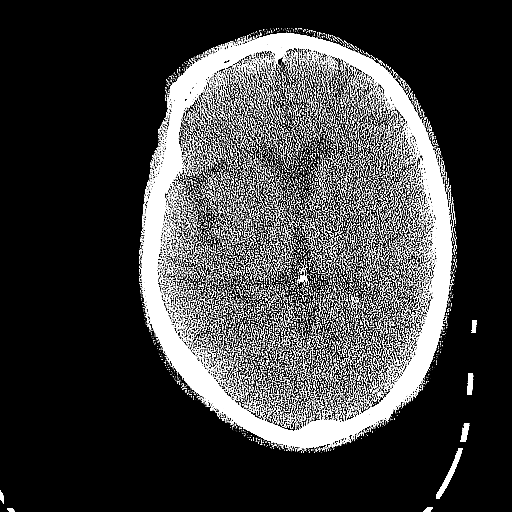
[im 42/83  brain]
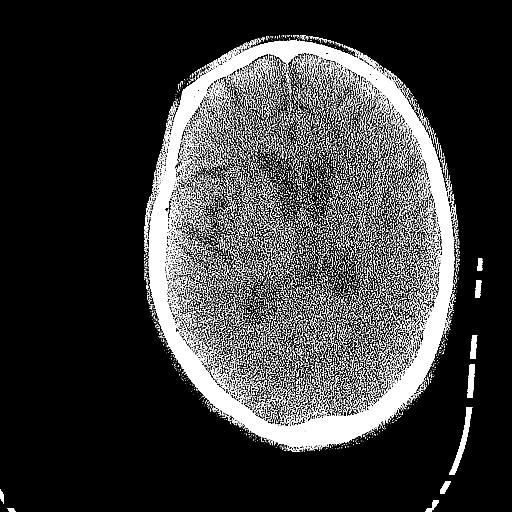
[im 46/83  brain]
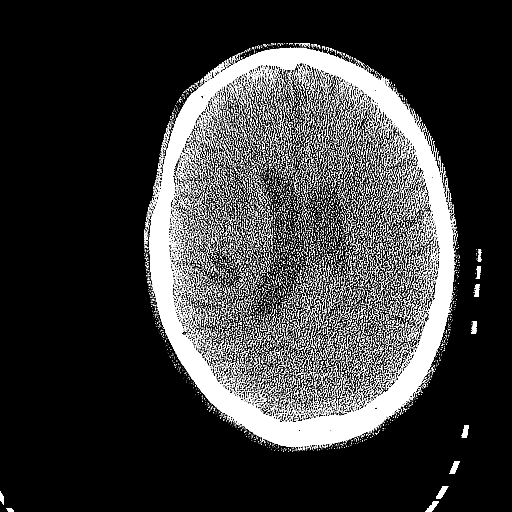
[im 46/83  bone]
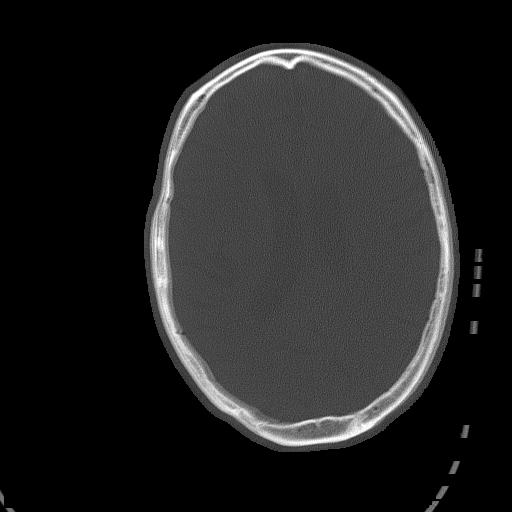
[im 54/83  brain]
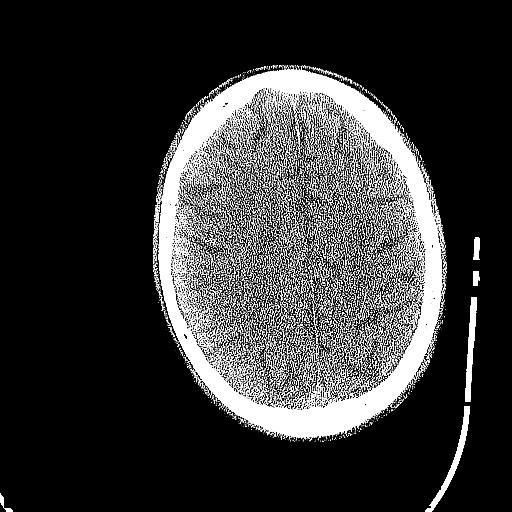
[im 58/83  brain]
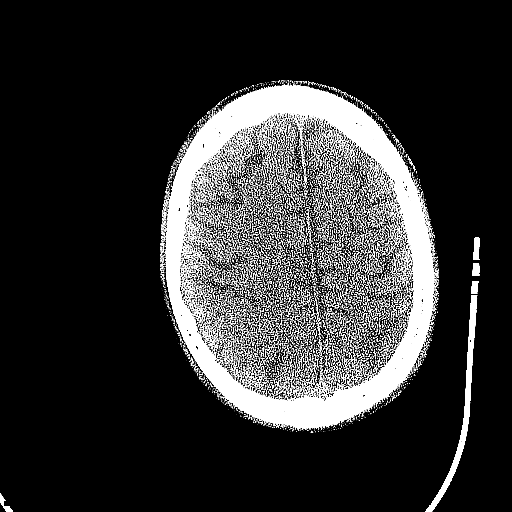
[im 62/83  brain]
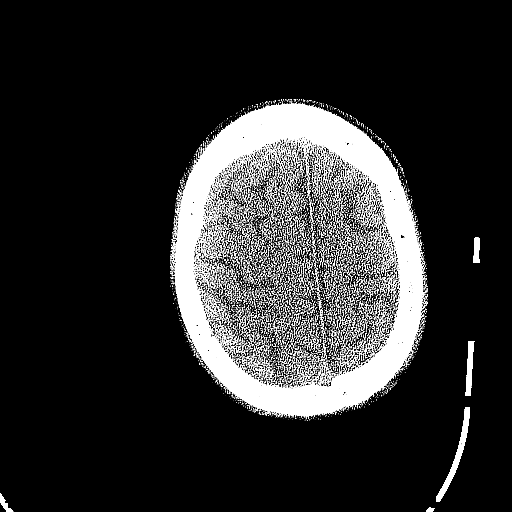
[im 66/83  brain]
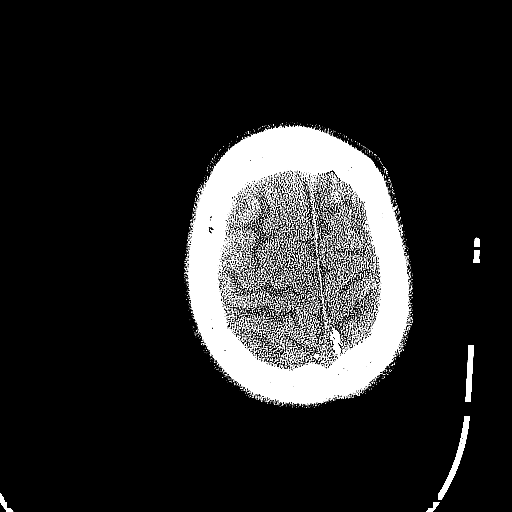
[im 66/83  bone]
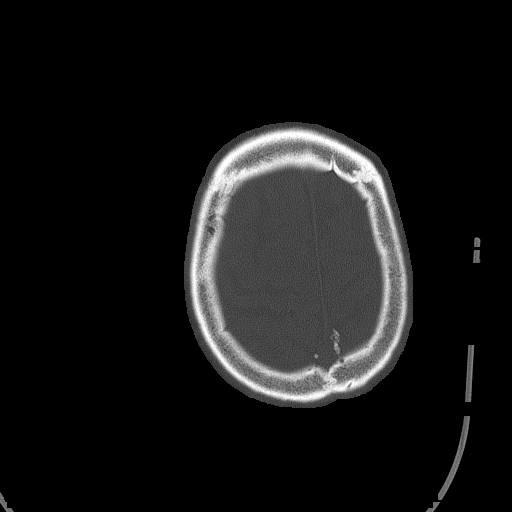
[im 74/83  brain]
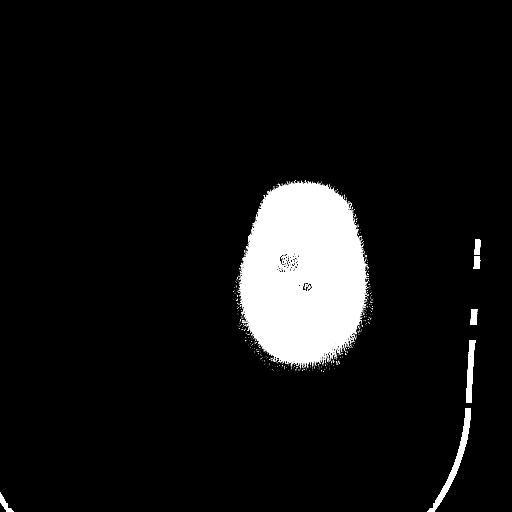
[im 78/83  brain]
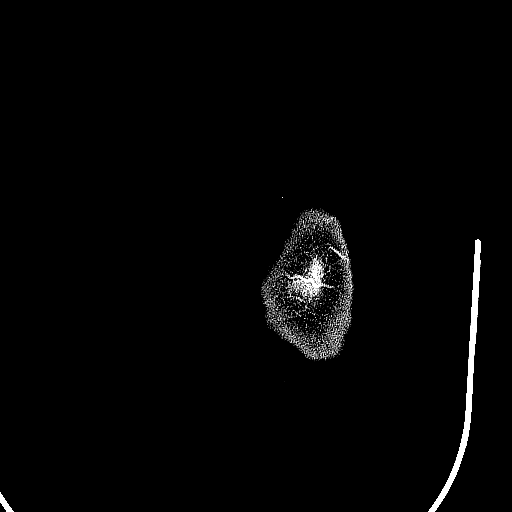

[15 of 30 positions shown; findings below may reference images not displayed]

FINDINGS: CT HEAD FINDINGS

Ventricles and sulci are appropriate for patient's age. Interval
development of left-greater-than-right basal ganglia hypodensities.
No evidence for acute intracranial hemorrhage, mass lesion or
mass-effect. Orbits are unremarkable. Paranasal sinuses are well
aerated. Mucosal thickening left maxillary sinus. Mastoid air cells
are unremarkable. Calvarium is intact.

CT CERVICAL SPINE FINDINGS

Normal anatomic alignment. No evidence for acute cervical spine
fracture. Craniocervical junction is intact. Preservation of the
vertebral body and intervertebral disc space heights. Congenital non
fusion posterior arch of C1. Lung apices are clear. Thyroid is
unremarkable. Prevertebral soft tissues unremarkable.
IMPRESSION: Interval development of hypodensities within the
left-greater-than-right basal ganglia which may represent subacute
infarcts.

No acute cervical spine fracture.

## 2017-08-08 IMAGING — CR DG CHEST 1V PORT
1 series · 1 of 1 positions shown · non-contrast
Comparison: 03/05/2016

CLINICAL DATA: Recent fall with altered mental status, initial
encounter

EXAM:
PORTABLE CHEST 1 VIEW

[AP]
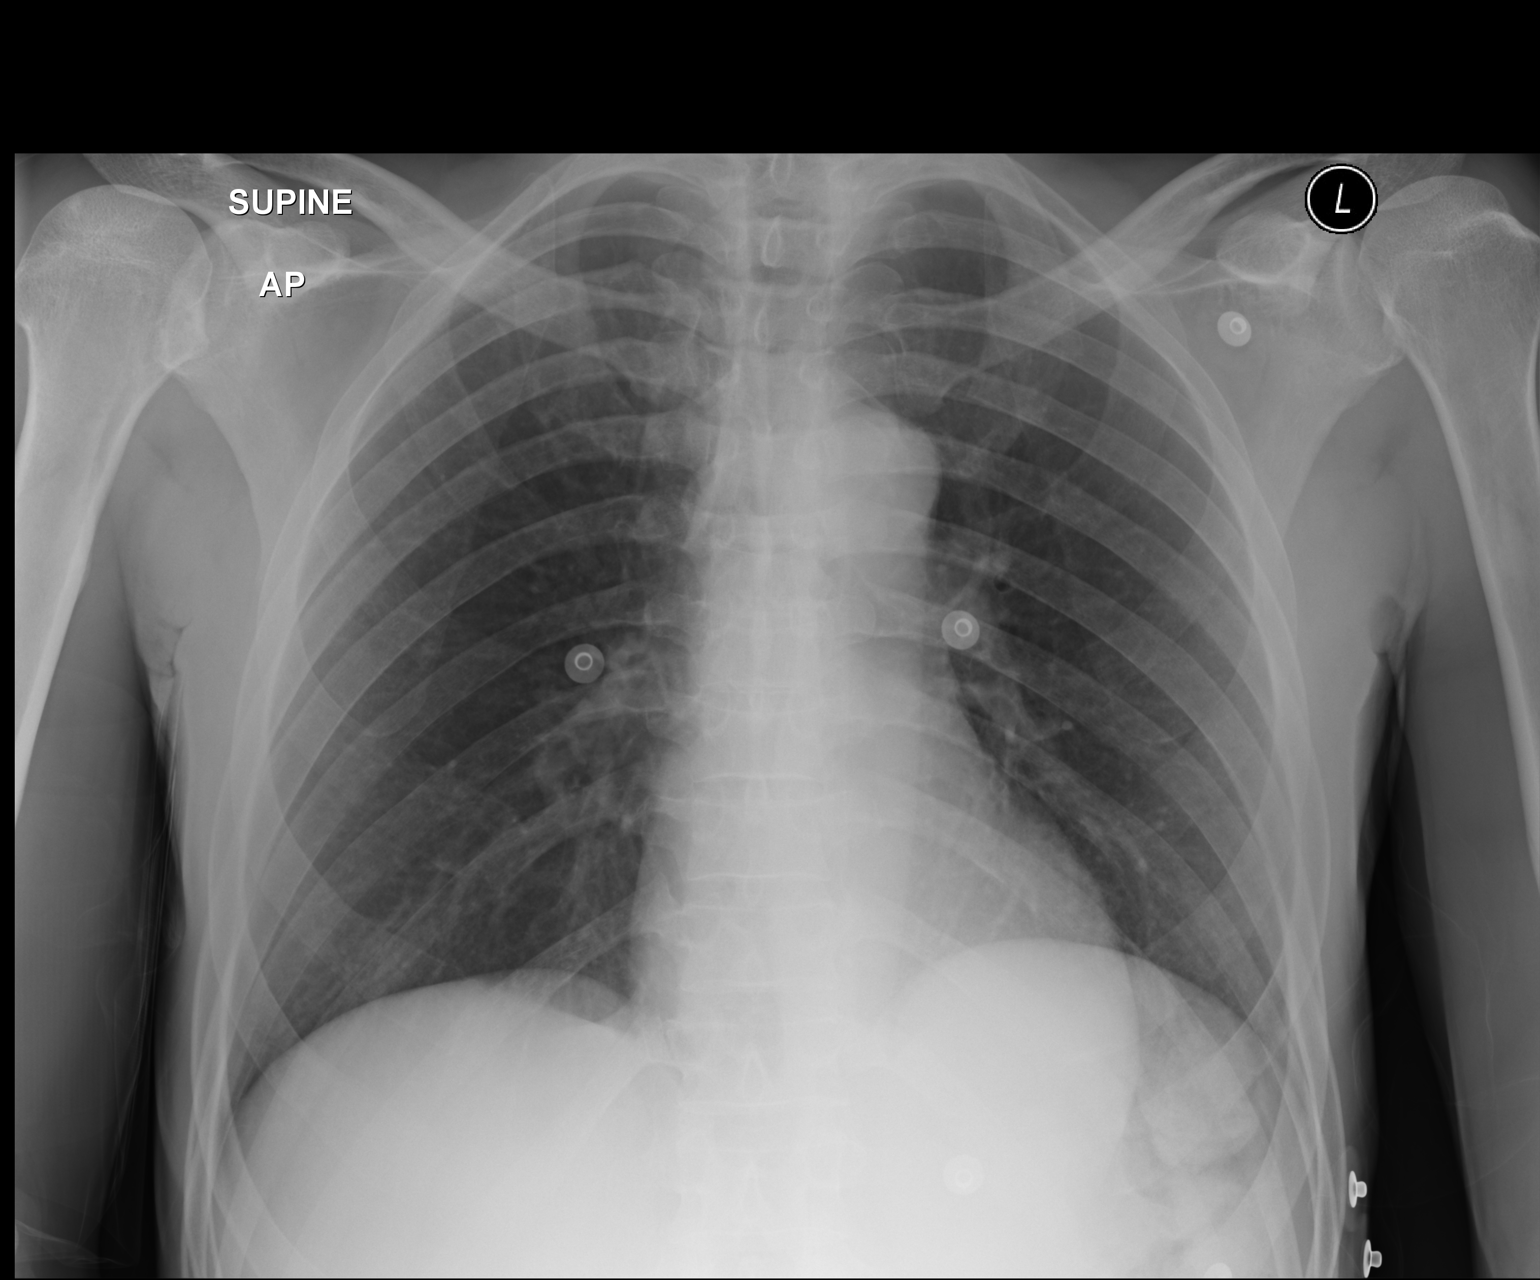

[1 of 1 positions shown; findings below may reference images not displayed]

FINDINGS: The heart size and mediastinal contours are within normal limits.
Both lungs are clear. The visualized skeletal structures are
unremarkable.
IMPRESSION: No active disease.

## 2017-08-09 IMAGING — MR MR HEAD WO/W CM
10 of 13 series · 33 of 48 positions shown · IV contrast (13    MH)
Comparison: CT head 03/20/2016

CLINICAL DATA: Found on floor. History of meningococcal meningitis
02/16/2016.

EXAM:
MRI HEAD WITHOUT AND WITH CONTRAST
TECHNIQUE: Multiplanar, multiecho pulse sequences of the brain and surrounding
structures were obtained without and with intravenous contrast.
CONTRAST:  13mL MULTIHANCE GADOBENATE DIMEGLUMINE 529 MG/ML IV SOLN

[Series 3: DWI · axial · 3.0mm · 1.09mm/px · z∈[-78,+52]mm · 9 of 90 slices shown (1 of 4)]
[im 1/90]
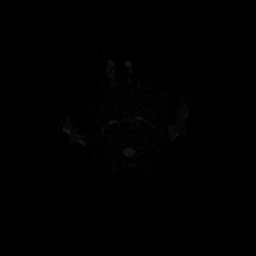
[im 12/90]
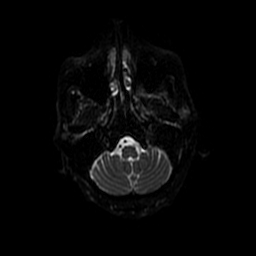
[im 23/90]
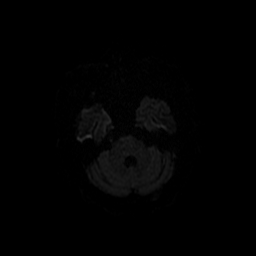
[im 34/90]
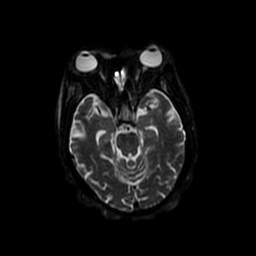
[im 45/90]
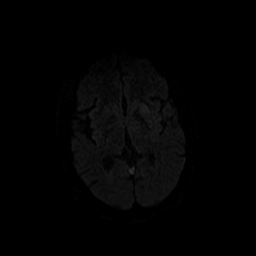
[im 56/90]
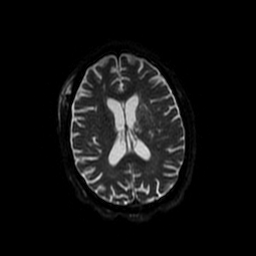
[im 67/90]
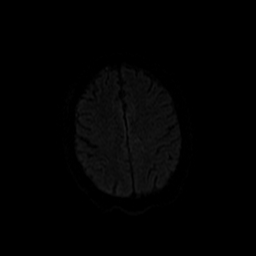
[im 78/90]
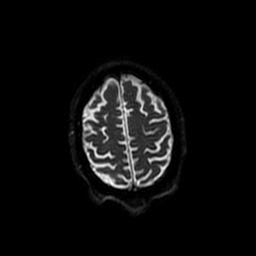
[im 90/90]
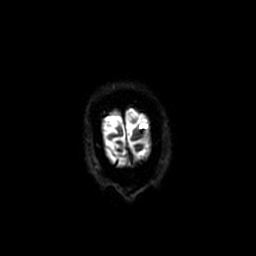

[Series 4: T1 · sagittal · 5.0mm · 0.47mm/px · 2 of 23 slices shown]
[im 1/23]
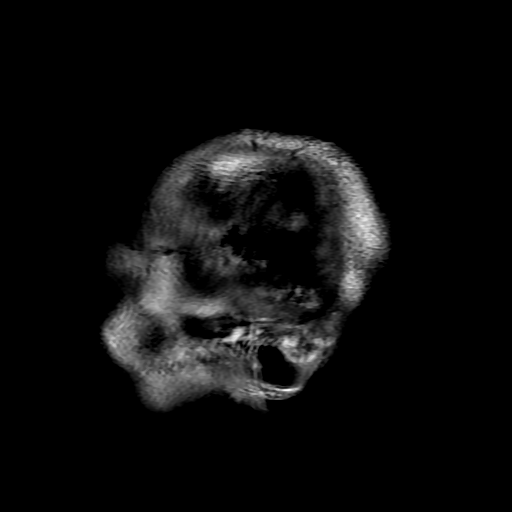
[im 23/23]
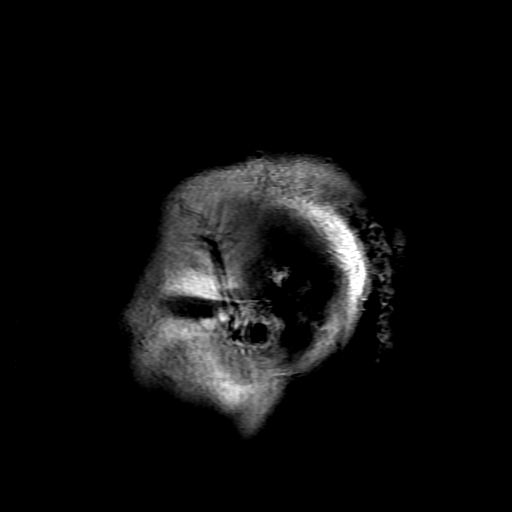

[Series 5: T2 · axial · 5.0mm · 0.43mm/px · z∈[-76,+55]mm · 2 of 25 slices shown (1 of 2)]
[im 1/25]
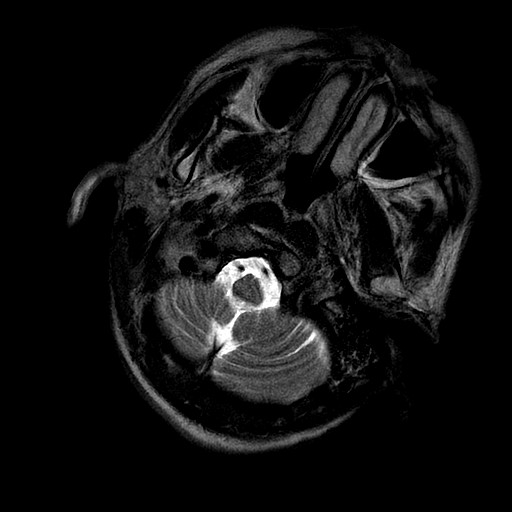
[im 25/25]
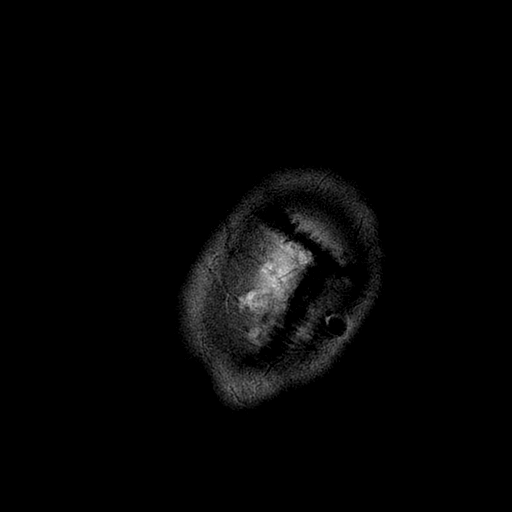

[Series 6: T2 · coronal · 5.0mm · 0.43mm/px · 2 of 28 slices shown (2 of 2)]
[im 1/28]
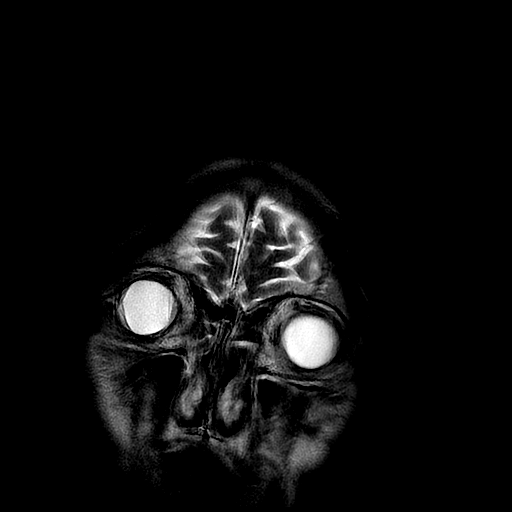
[im 28/28]
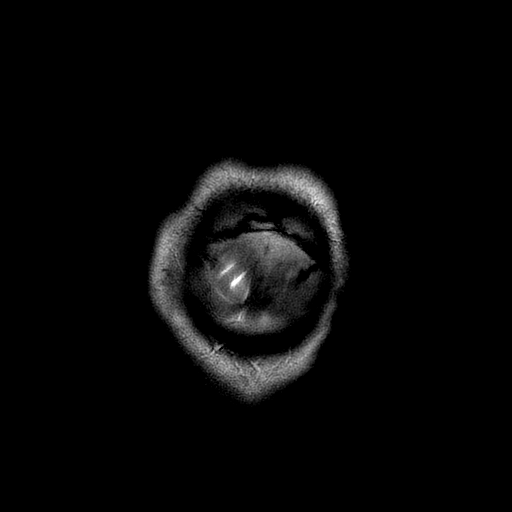

[Series 7: FLAIR · axial · 5.0mm · 0.43mm/px · z∈[-85,+56]mm · 2 of 27 slices shown]
[im 1/27]
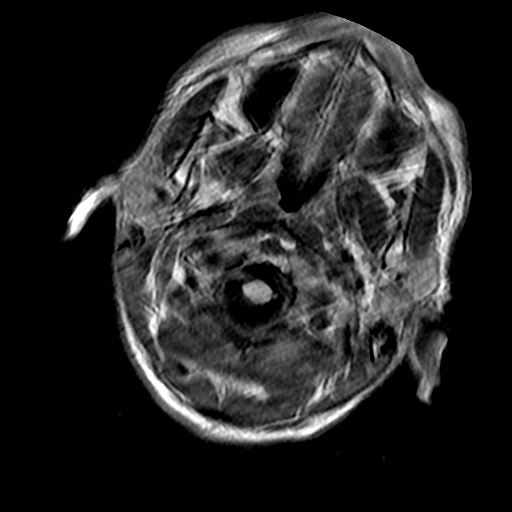
[im 27/27]
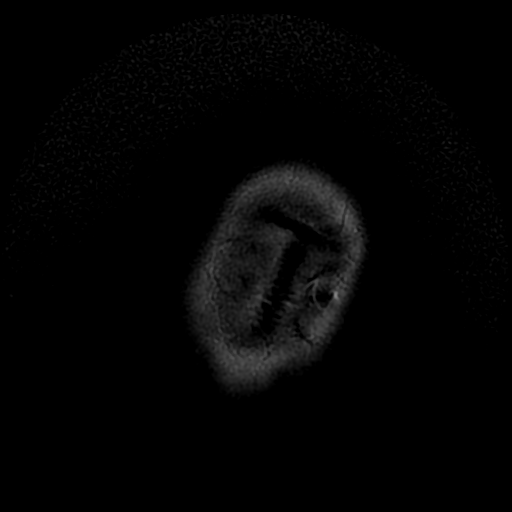

[Series 8: DWI · coronal · 5.0mm · 1.09mm/px · 5 of 64 slices shown (2 of 4)]
[im 1/64]
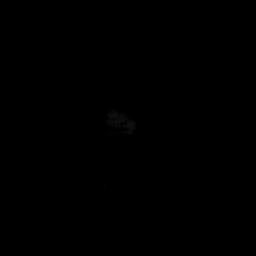
[im 16/64]
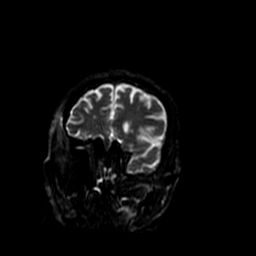
[im 32/64]
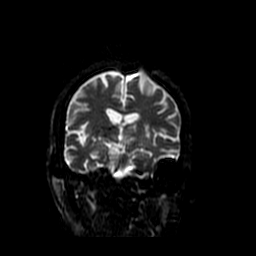
[im 48/64]
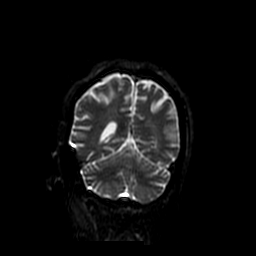
[im 64/64]
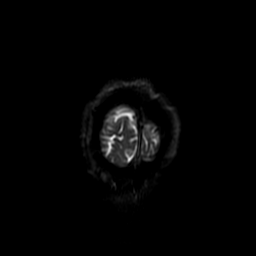

[Series 10: ax mpgr · axial · 5.0mm · 0.43mm/px · z∈[-85,+56]mm · 2 of 27 slices shown]
[im 1/27]
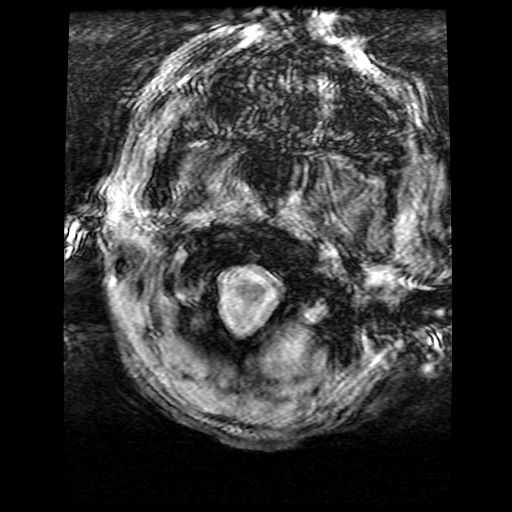
[im 27/27]
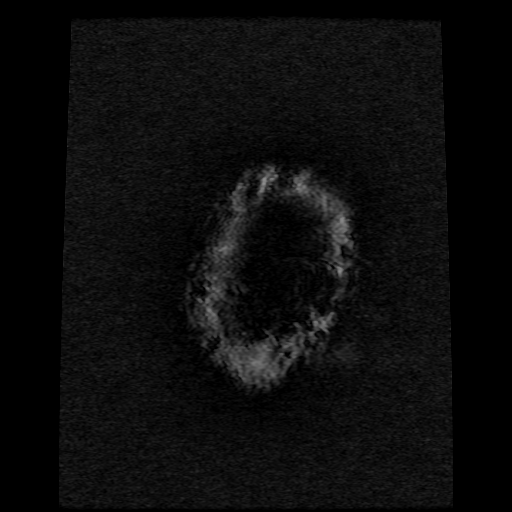

[Series 15: ax 2d spgr · axial · 3.0mm · 0.47mm/px · z∈[-88,-41]mm · 2 of 50 slices shown]
[im 1/50]
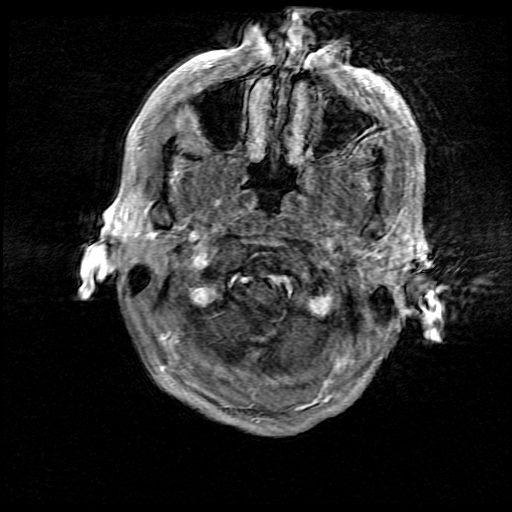
[im 17/50]
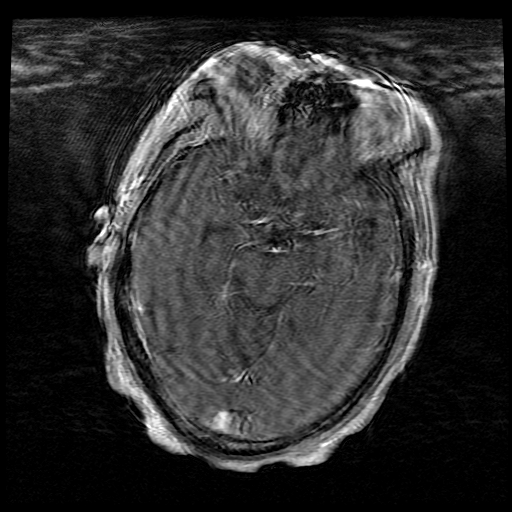

[Series 300: DWI · axial · 3.0mm · 1.09mm/px · z∈[-78,+52]mm · 4 of 45 slices shown (3 of 4)]
[im 1/45]
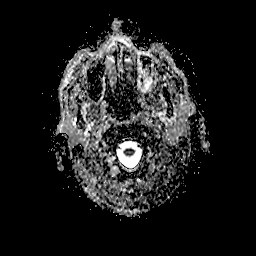
[im 15/45]
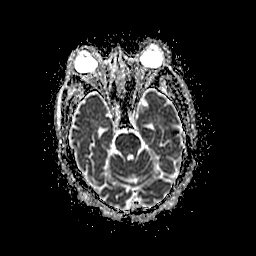
[im 30/45]
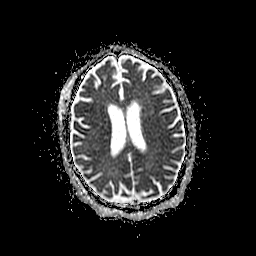
[im 45/45]
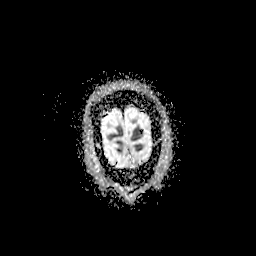

[Series 800: DWI · coronal · 5.0mm · 1.09mm/px · 3 of 32 slices shown (4 of 4)]
[im 1/32]
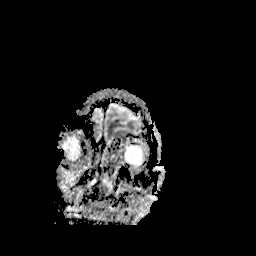
[im 16/32]
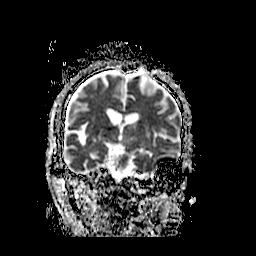
[im 32/32]
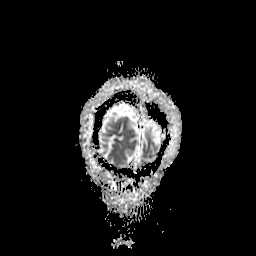

[33 of 48 positions shown; findings below may reference images not displayed]

FINDINGS: Image quality degraded by motion. Postcontrast images have
significant motion.

Edema in the basal ganglia bilaterally left greater than right as
noted on CT. There is enhancement in the basal ganglia bilaterally.
There is abnormal enhancement in the external capsule on the left,
rim enhancing enhancement in the left internal capsule and medial
basal ganglia. Abnormal enhancement in the left caudate. There is
enhancement in the right caudate and right putamen. Many of these
areas are within vascular distribution suggesting ischemia. Small
area of enhancement in the right parietal cortex could also be an
area of subacute infarct.

Diffusion-weighted imaging shows restricted diffusion in the left
putamen and caudate and possibly in the external capsule on the
left. These are most likely areas of subacute infarct. Restricted
diffusion also noted in the right parietal cortex where there is
abnormal enhancement. Right basal ganglia does not show significant
restricted diffusion but does show T2 shine through.

Ventricle size normal. No shift of the midline structures. Negative
for intracranial hemorrhage.
IMPRESSION: Image quality degraded by significant motion.

There is extensive abnormal enhancement in the basal ganglia
bilaterally. Some of these areas show restricted diffusion left
basal ganglia. The enhancement pattern and vascular territory
suggest the enhancement is related to subacute infarct related to
meningitis 1 month ago. Similar area of enhancement and restricted
diffusion in the right parietal cortex. No intracranial hemorrhage.

## 2017-08-10 IMAGING — CT CT ANGIO NECK
1 of 12 series · 1 of 33 positions shown · IV contrast (Iohexol (Omnipaque 350))
Comparison: MRI 03/21/2016

CLINICAL DATA: Pneumococcal meningitis 02/16/2016. Now with
multiple basal ganglia infarcts.

EXAM:
CT ANGIOGRAPHY HEAD AND NECK
TECHNIQUE: Multidetector CT imaging of the head and neck was performed using
the standard protocol during bolus administration of intravenous
contrast. Multiplanar CT image reconstructions and MIPs were
obtained to evaluate the vascular anatomy. Carotid stenosis
measurements (when applicable) are obtained utilizing NASCET
criteria, using the distal internal carotid diameter as the
denominator.
CONTRAST:  50 mL Isovue 370 IV

[Series 300: locator · axial · 0.49mm/px · 1 of 1 slices shown]
[im 1/1  soft-tissue]
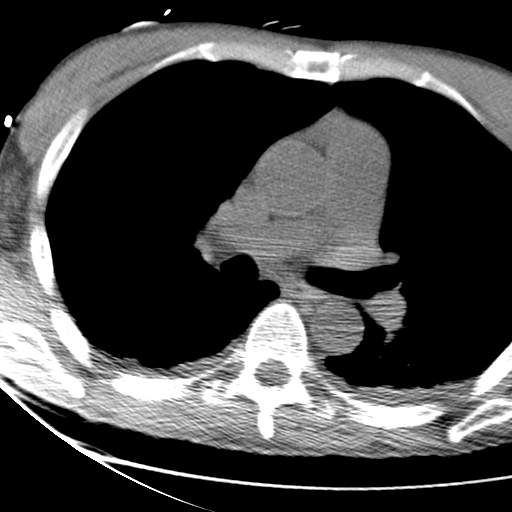

[1 of 33 positions shown; findings below may reference images not displayed]

FINDINGS: CT HEAD

Brain: Generalized atrophy. Multiple areas of enhancement in the
basal ganglia bilaterally best seen on MRI. Patchy low-density areas
are noted in the basal ganglia bilaterally which may be due to
infarction. No acute hemorrhage or mass lesion. No shift of the
midline structures.

Calvarium and skull base: Negative

Paranasal sinuses: Mucosal edema left maxillary sinus. Mild mucosal
edema sphenoid sinus.

Orbits: Negative

CTA NECK

Aortic arch: Normal aortic arch. Proximal great vessels widely
patent. Bovine branching pattern.

Right carotid system: Right common carotid artery widely patent.
Right carotid bifurcation widely patent

Left carotid system: Left common carotid artery widely patent. Left
carotid bifurcation widely patent.

Vertebral arteries:Both vertebral arteries widely patent without
significant stenosis.

Skeleton: Negative

Other neck: Negative

CTA HEAD

Anterior circulation: Cavernous carotid is patent bilaterally.
Anterior and middle cerebral arteries are patent bilaterally. No
occluded segments. The A1 and M1 segments are smaller in caliber
wall caliber than expected. This could be due to basilar meningitis
or spasm. MCA branches appear patent bilateral without significant
irregularity to suggest vasculitis.

Posterior circulation: Both vertebral arteries patent to the
basilar. PICA patent bilaterally. Basilar widely patent. Superior
cerebellar and posterior cerebral arteries patent bilaterally
without stenosis.

Venous sinuses: Negative

Anatomic variants: Negative

Delayed phase: Subtle areas of enhancement in the left basal ganglia
as noted on MRI. No definite abnormal enhancement in the right basal
ganglia. Small area of cortical enhancement in the right parietal
cortex. These areas are most consistent with subacute infarction.
IMPRESSION: Circle of Willis patent without focal stenosis or branch occlusion.
Anterior and middle cerebral arteries show diffuse narrowing which
may be due to basilar meningitis or vasospasm. This is a subjective
findings and is diffuse.

Negative CTA neck

Subtle enhancement in the left basal ganglia and right parietal
cortex corresponding to areas of abnormal enhancement on the MRI.
These are most likely due to enhancing subacute infarction related
to recent meningococcal meningitis causing occlusion of arterial
perforators.

## 2017-08-11 IMAGING — CR DG ABD PORTABLE 1V
1 series · 1 of 1 positions shown · non-contrast
Comparison: None.

CLINICAL DATA: Encounter for NG tube placement.

EXAM:
PORTABLE ABDOMEN - 1 VIEW

[AP]
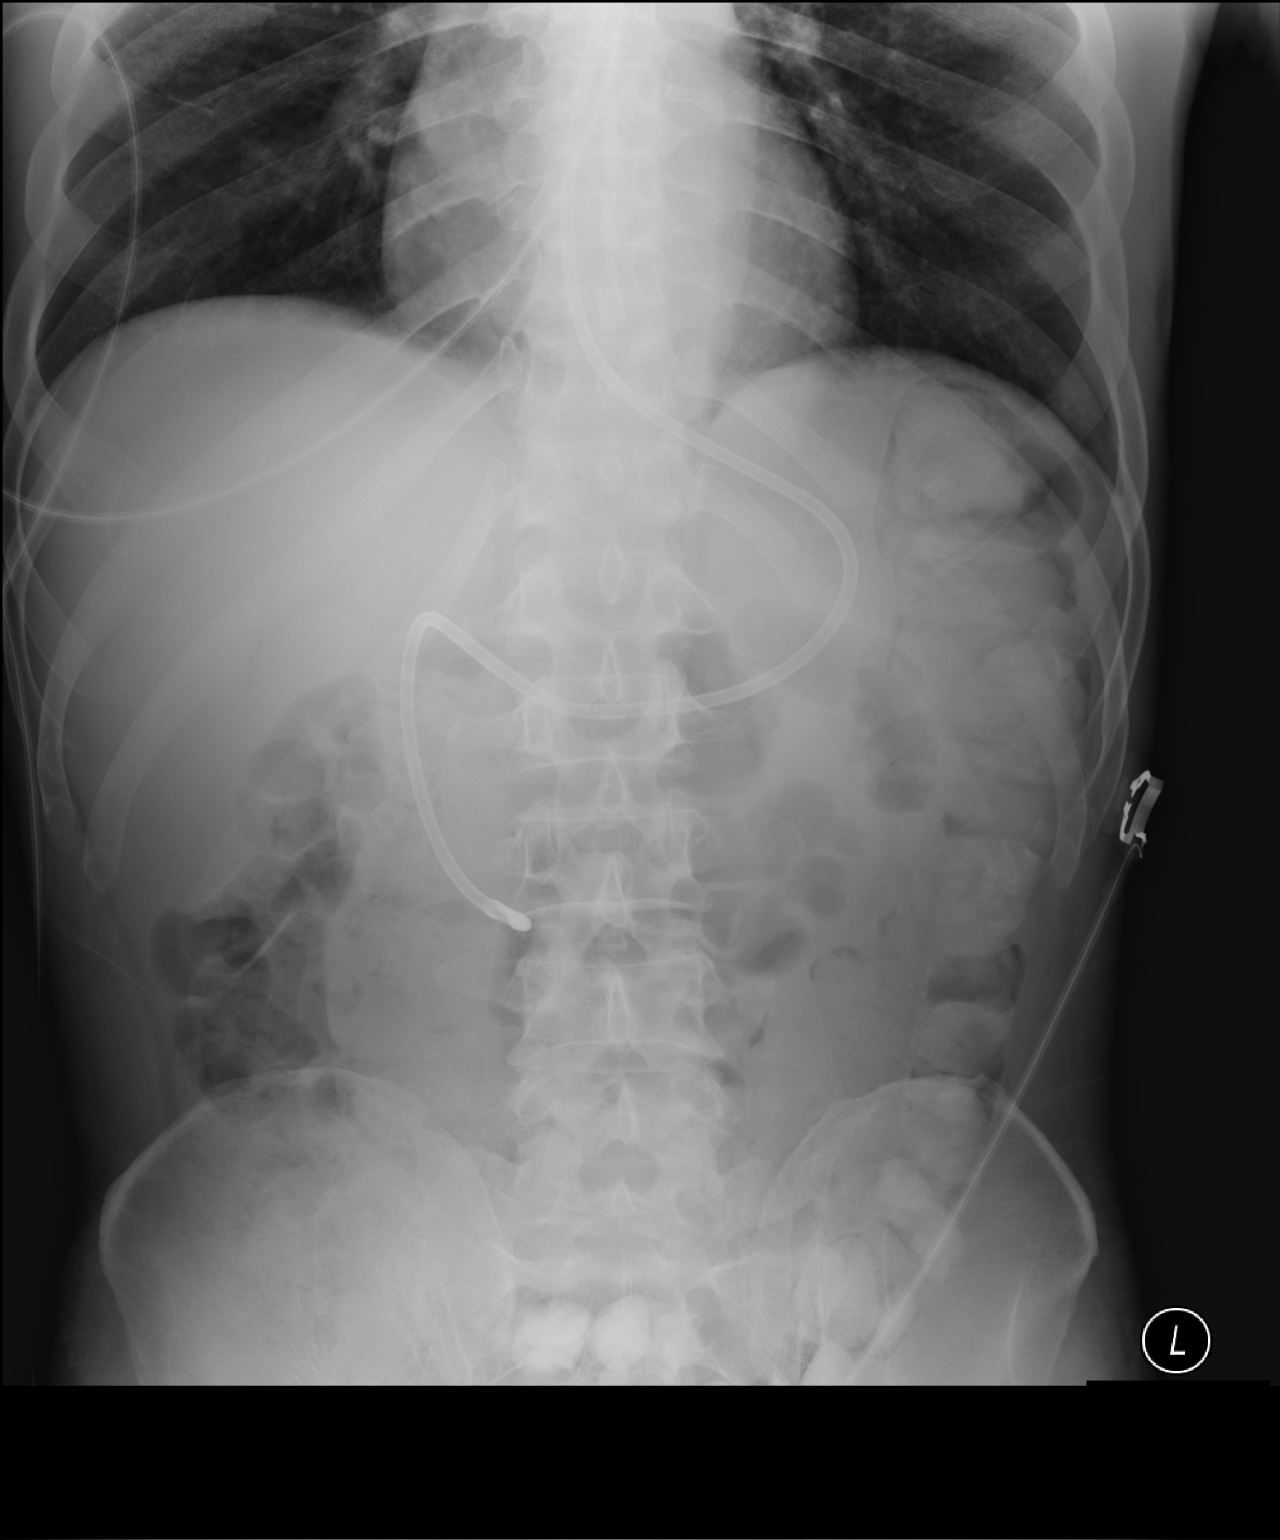

[1 of 1 positions shown; findings below may reference images not displayed]

FINDINGS: Tip of the weighted enteric tube in the central abdomen is likely
post pyloric in the region of the third portion of the duodenum.
There is a normal bowel gas pattern. Enteric contrast is seen in the
last and rectosigmoid colon likely from prior modified barium
swallow.
IMPRESSION: Tip of the weighted enteric tube is post pyloric in the region of
the third portion of the duodenum.

## 2017-08-13 IMAGING — CR DG ABD PORTABLE 1V
1 series · 1 of 1 positions shown · non-contrast
Comparison: 03/23/2016

CLINICAL DATA: 49-year-old male with a history of nasogastric tube.
New placement after displacement.

EXAM:
PORTABLE ABDOMEN - 1 VIEW

[AP]
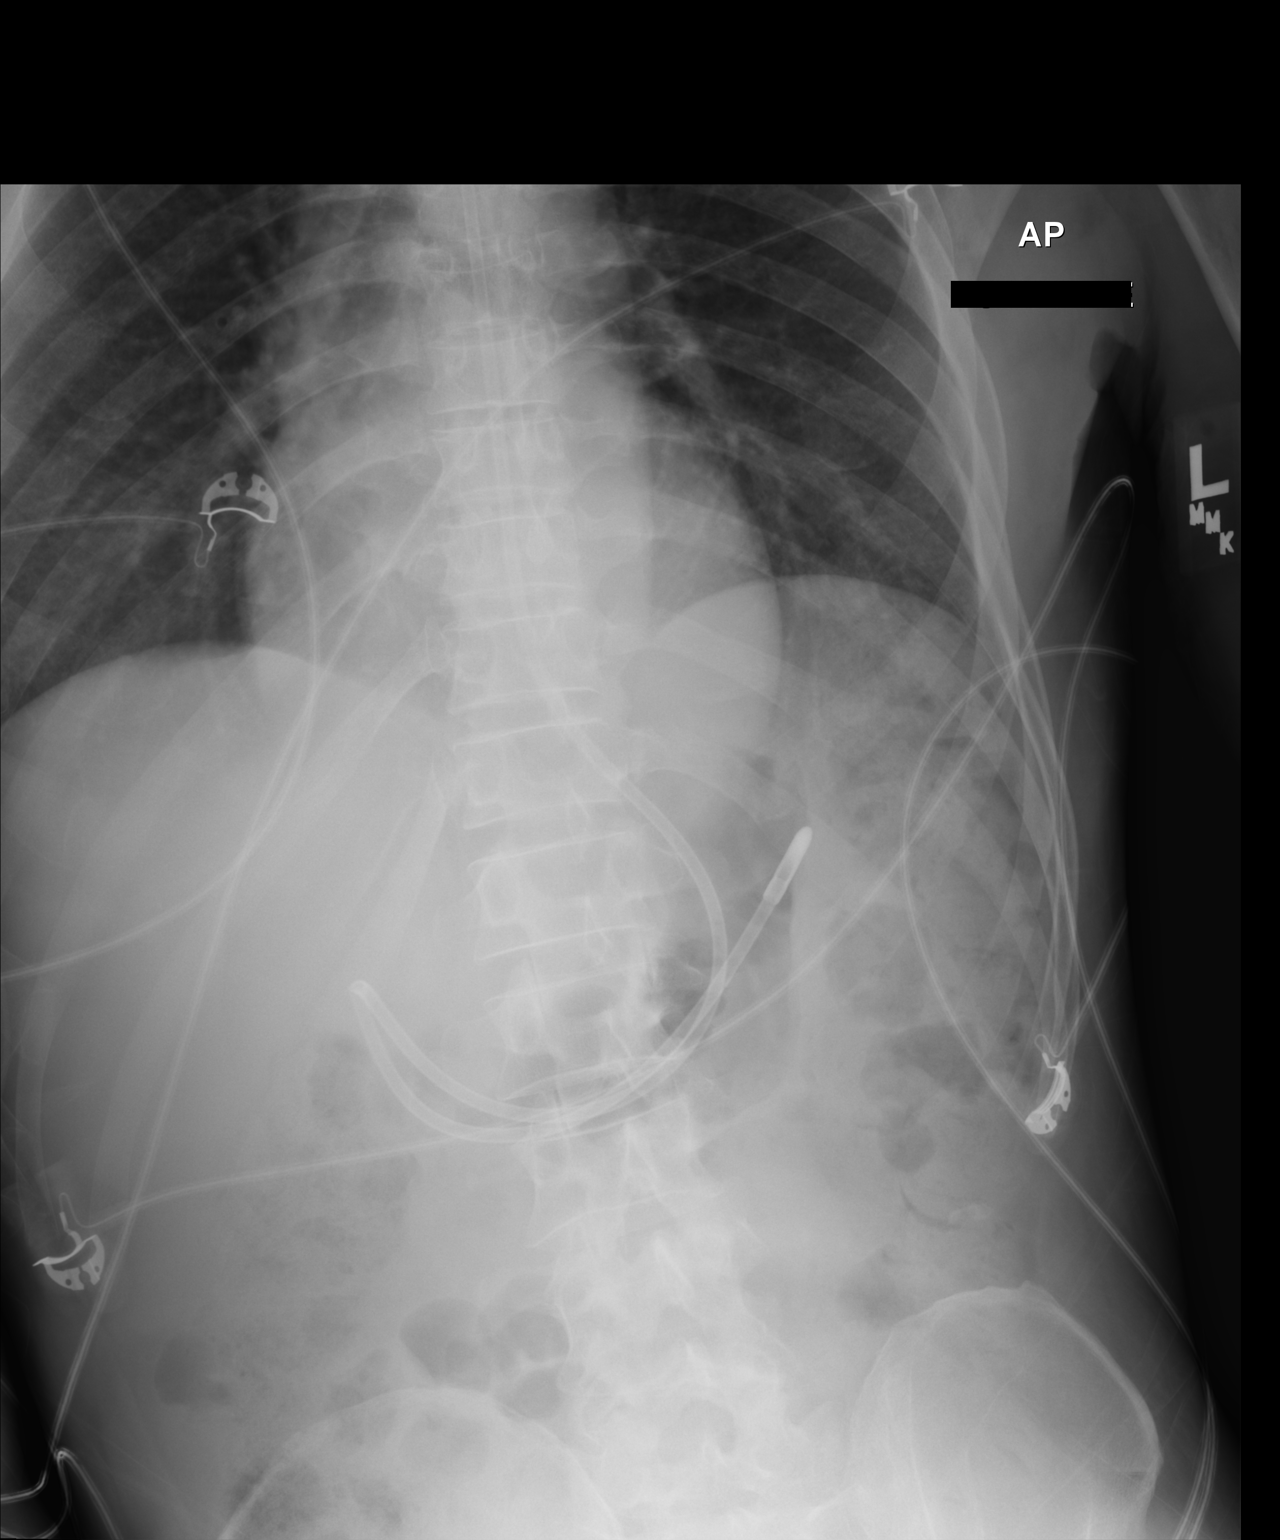

[1 of 1 positions shown; findings below may reference images not displayed]

FINDINGS: Metallic tip enteric feeding tube projects over the mediastinum,
coiled within the stomach lumen, directed toward the cardia of the
stomach.

Unremarkable appearance of lung bases.

Unremarkable bowel gas pattern.
IMPRESSION: Plain film of the upper abdomen demonstrates weighted metallic tip
feeding tube terminating within the stomach, with the tip
terminating towards the cardia.

## 2017-08-17 IMAGING — CR DG ABD PORTABLE 1V
1 series · 1 of 1 positions shown · non-contrast
Comparison: April 24, 2016.

CLINICAL DATA: Encounter for feeding tube placement.

EXAM:
PORTABLE ABDOMEN - 1 VIEW

[AP]
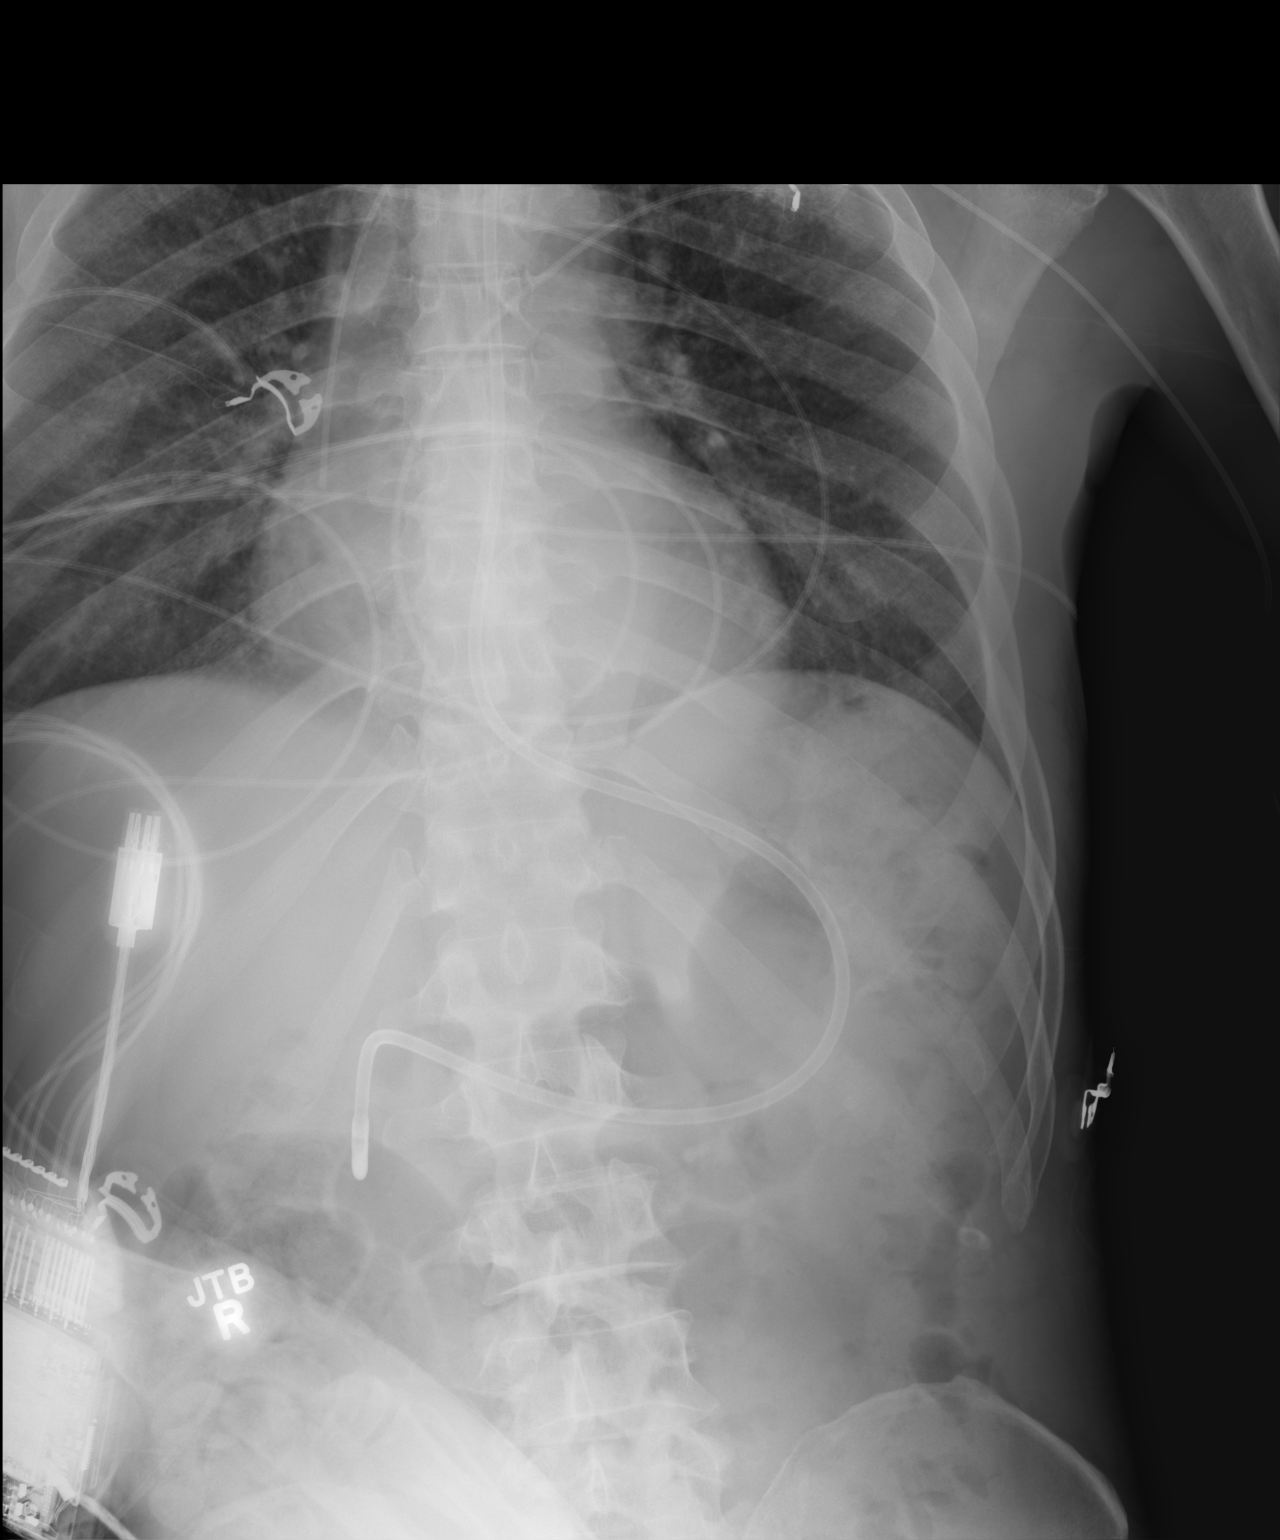

[1 of 1 positions shown; findings below may reference images not displayed]

FINDINGS: The bowel gas pattern is normal. Distal tip of feeding tube is in
expected position of proximal duodenum.
IMPRESSION: Distal tip of feeding tube seen in expected position of proximal
duodenum. No evidence of bowel obstruction or ileus.

## 2017-09-09 IMAGING — US DG FLUORO GUIDE CV LINE
1 series · 1 of 1 positions shown · IV contrast (agent unspecified)
Comparison: none

INDICATION: Meningitis. Poor venous access. Previously placed PICC line
inadvertently removed. Request new PICC line placement.

EXAM:
ULTRASOUND AND FLUOROSCOPIC GUIDED PICC LINE INSERTION
MEDICATIONS:
None.
CONTRAST:  None
FLUOROSCOPY TIME:  12 seconds
COMPLICATIONS:
None immediate.
TECHNIQUE: The procedure, risks, benefits, and alternatives were explained to
the patient's son and spouse and informed written consent was
obtained. A timeout was performed prior to the initiation of the
procedure.

[Series 1: ir fluoro/shunt/fist · 1 of 1 slices shown]
[im 1/1]
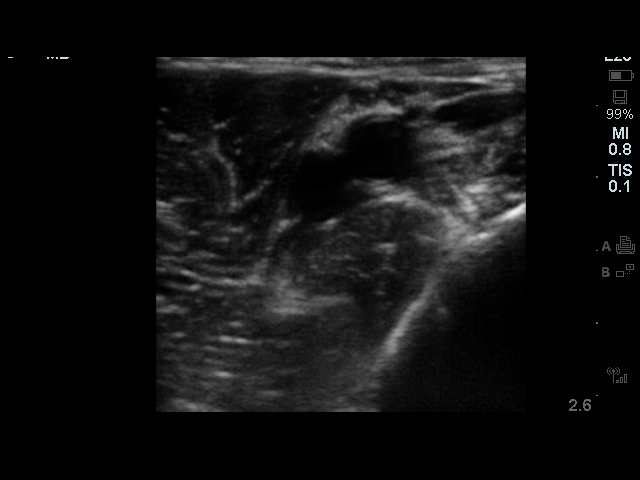

[1 of 1 positions shown; findings below may reference images not displayed]

The right upper extremity was prepped with chlorhexidine in a
sterile fashion, and a sterile drape was applied covering the
operative field. Maximum barrier sterile technique with sterile
gowns and gloves were used for the procedure. A timeout was
performed prior to the initiation of the procedure. Local anesthesia
was provided with 1% lidocaine.

Under direct ultrasound guidance, the right basilic vein was
accessed with a micropuncture kit after the overlying soft tissues
were anesthetized with 1% lidocaine. An ultrasound image was saved
for documentation purposes. A guidewire was advanced to the level of
the superior caval-atrial junction for measurement purposes and the
PICC line was cut to length. A peel-away sheath was placed and a 35
cm, 5 French, dual lumen was inserted to level of the superior
caval-atrial junction. A post procedure spot fluoroscopic was
obtained. The catheter easily aspirated and flushed and was sutured
in place. A dressing was placed. The patient tolerated the procedure
well without immediate post procedural complication.
FINDINGS: After catheter placement, the tip lies within the superior
cavoatrial junction. The catheter aspirates and flushes normally and
is ready for immediate use.
IMPRESSION: Successful ultrasound and fluoroscopic guided placement of a right
basilic vein approach, 35 cm, 5 French, dual lumen PICC with tip at
the superior caval-atrial junction. The PICC line is ready for
immediate use.

## 2017-10-07 IMAGING — CR DG CHEST 1V PORT
1 series · 1 of 1 positions shown · non-contrast
Comparison: 03/20/2016

CLINICAL DATA: Hypothermia

EXAM:
PORTABLE CHEST 1 VIEW

[AP]
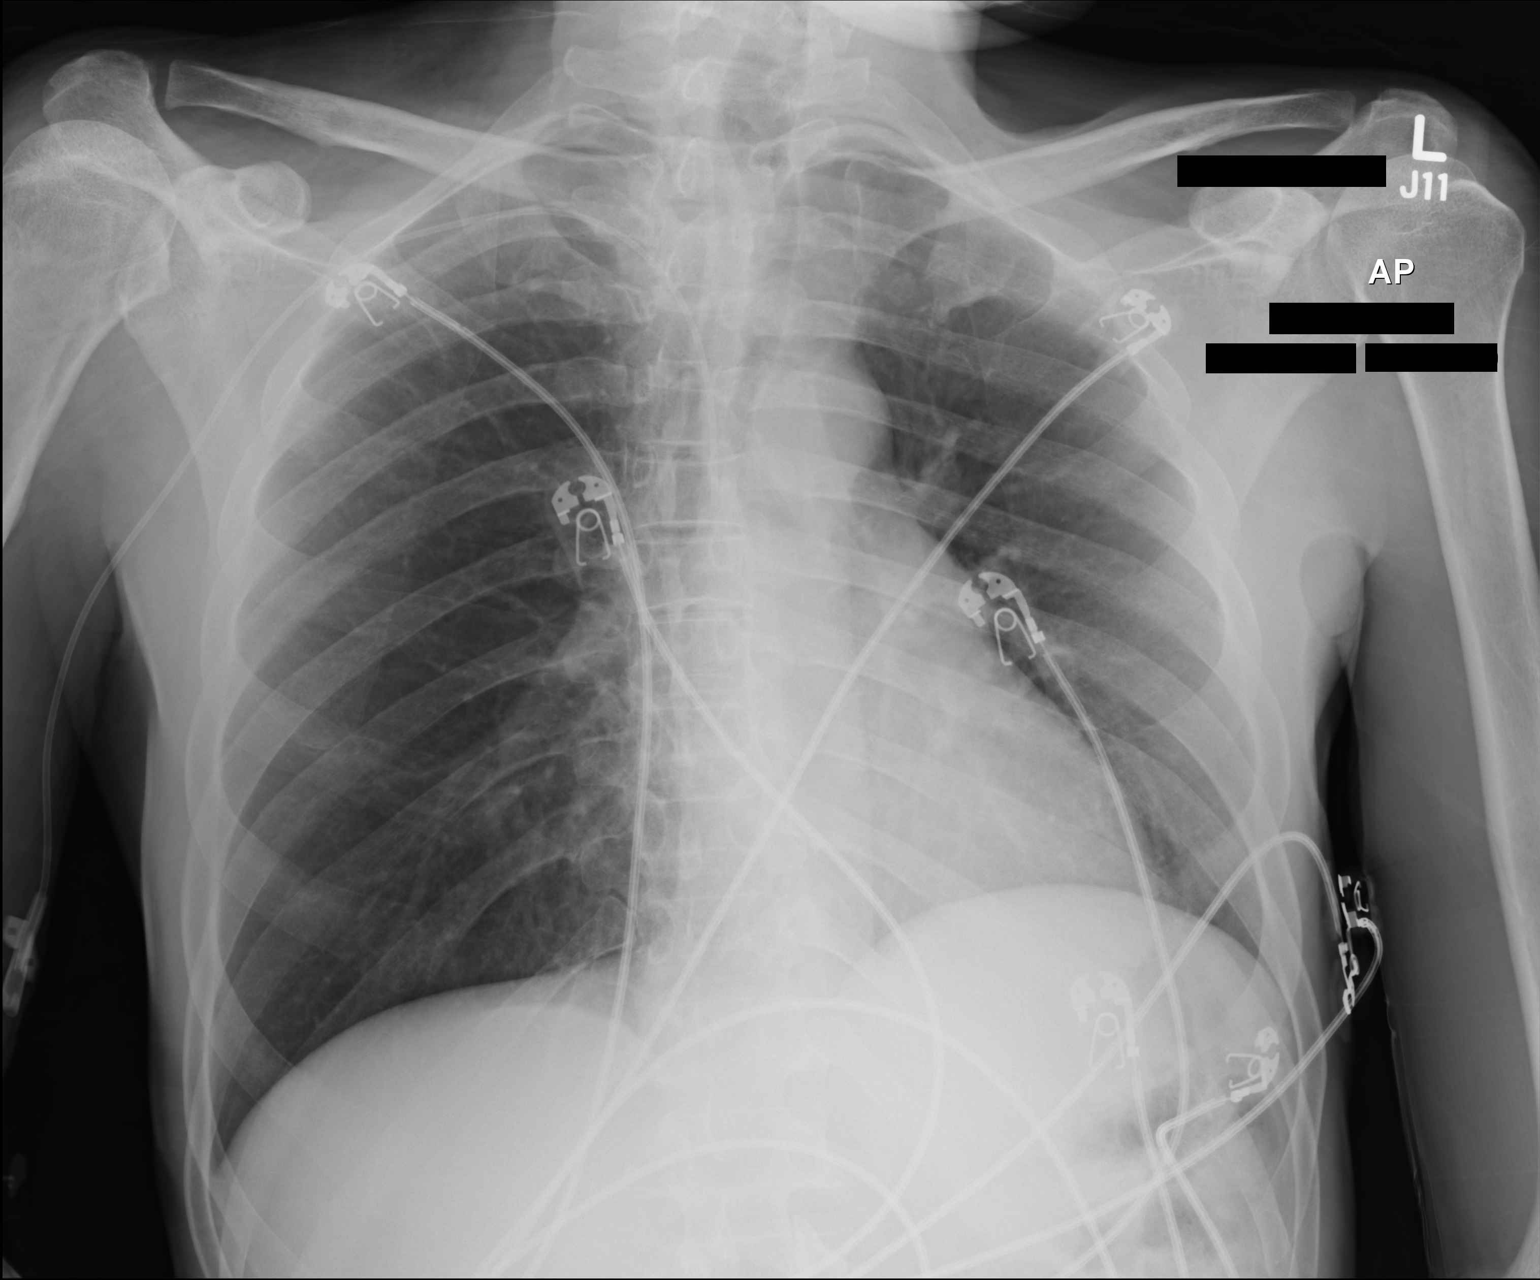

[1 of 1 positions shown; findings below may reference images not displayed]

FINDINGS: The patient is rotated to the left on today's radiograph, reducing
diagnostic sensitivity and specificity. Right PICC line tip: SVC.

The lungs appear clear. Cardiac and mediastinal contours normal. No
pleural effusion identified.
IMPRESSION: 1. No acute findings.
2. Right PICC line tip:  SVC.

## 2017-10-07 IMAGING — CT CT HEAD W/O CM
4 series · 16 of 47 positions shown, 18 images · non-contrast
Comparison: 03/22/2016

CLINICAL DATA: Seizure.  Recent diagnosis of meningitis.  Dementia.

EXAM:
CT HEAD WITHOUT CONTRAST
TECHNIQUE: Contiguous axial images were obtained from the base of the skull
through the vertex without intravenous contrast.

[Series 2: head without · axial · non-contrast · 0.44mm/px · z∈[-107,+3]mm · 7 of 30 slices shown, 9 images]
[im 4/30  brain]
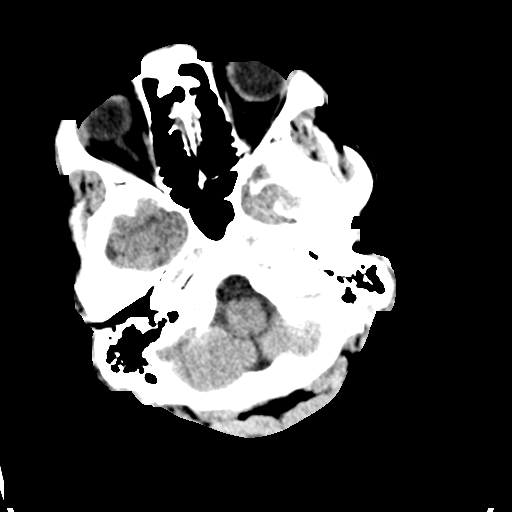
[im 4/30  bone]
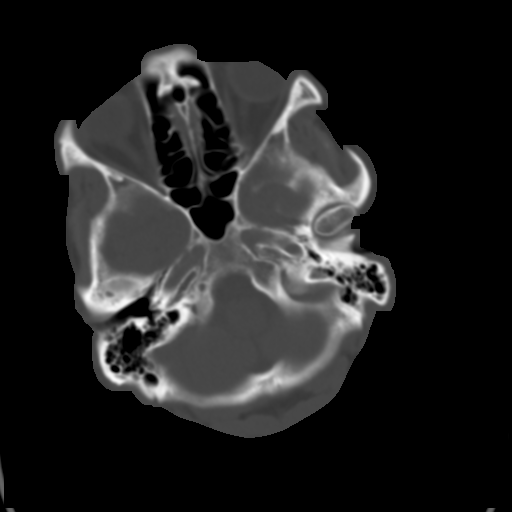
[im 8/30  brain]
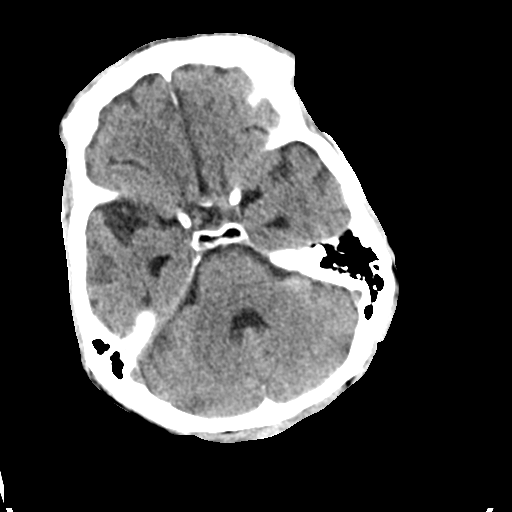
[im 11/30  brain]
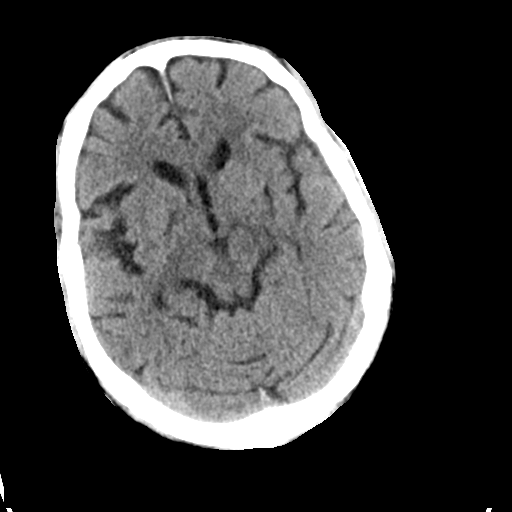
[im 15/30  brain]
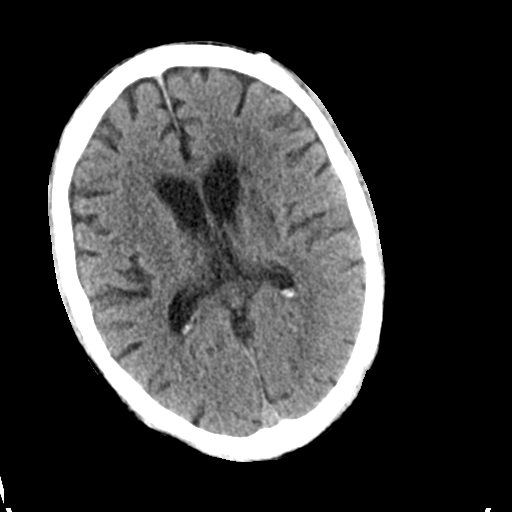
[im 19/30  brain]
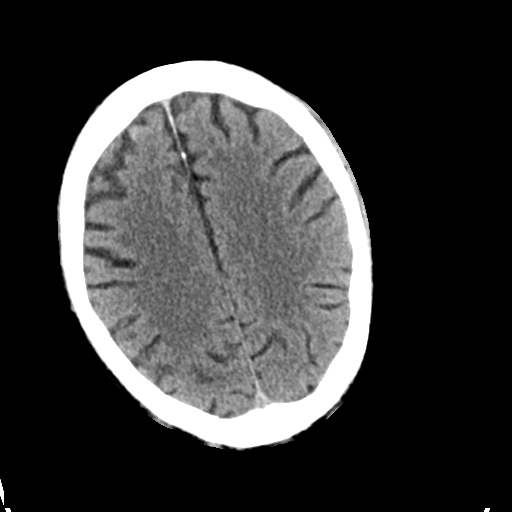
[im 19/30  bone]
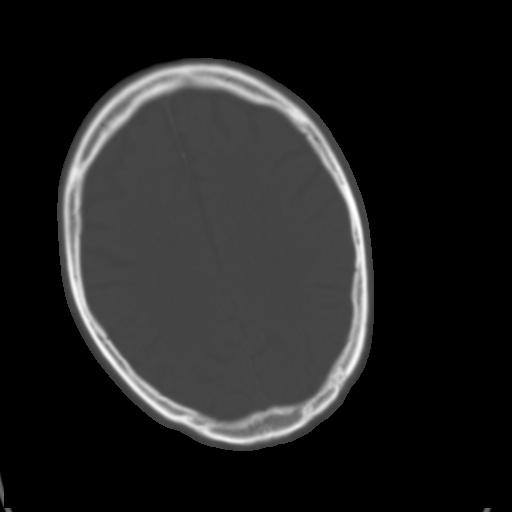
[im 22/30  brain]
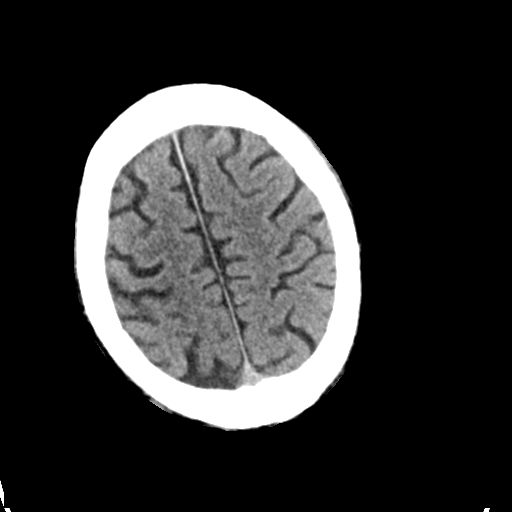
[im 26/30  brain]
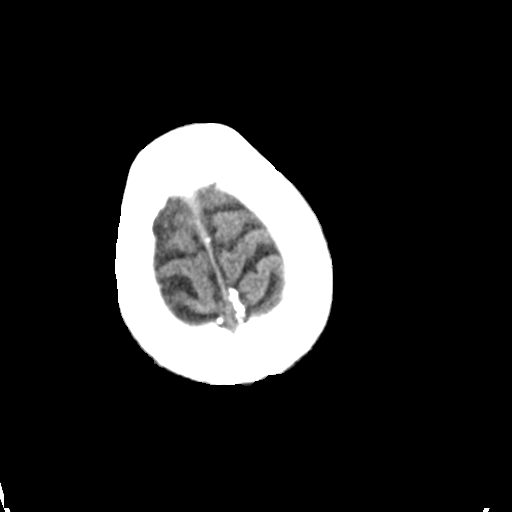

[Series 3: head bone · axial · 0.44mm/px · z∈[-108,-80]mm · 3 of 74 slices shown]
[im 8/74  bone]
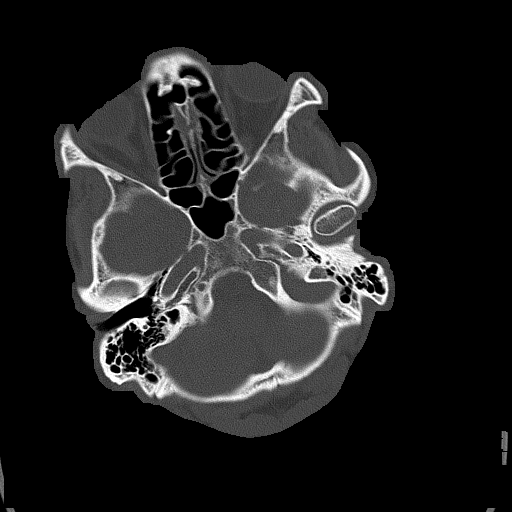
[im 15/74  bone]
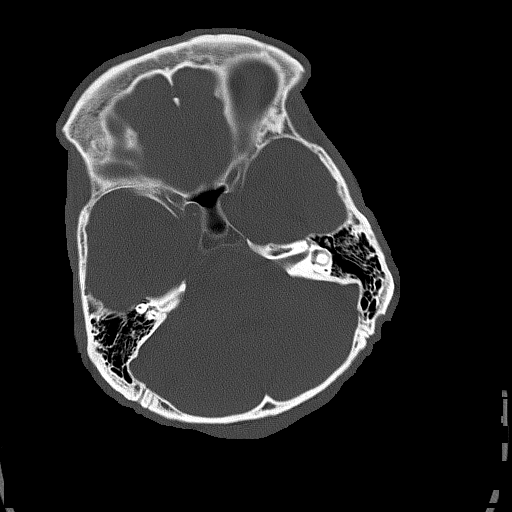
[im 22/74  bone]
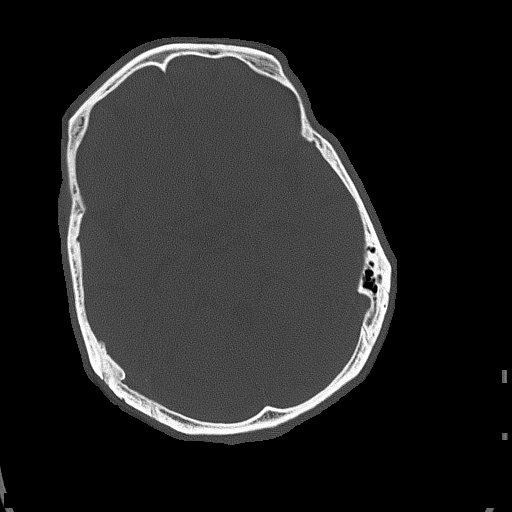

[Series 4: head without cor · coronal · non-contrast · 0.33mm/px · 3 of 64 slices shown]
[im 22/64  brain]
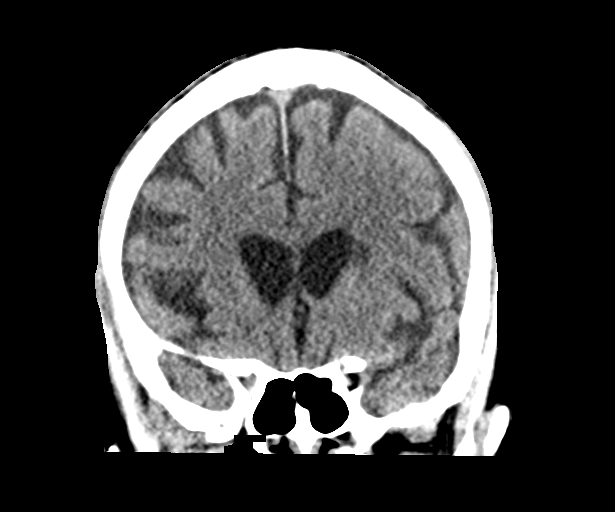
[im 29/64  brain]
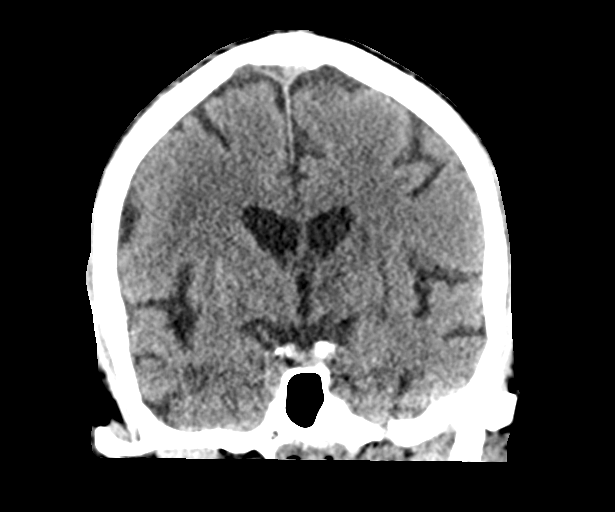
[im 36/64  brain]
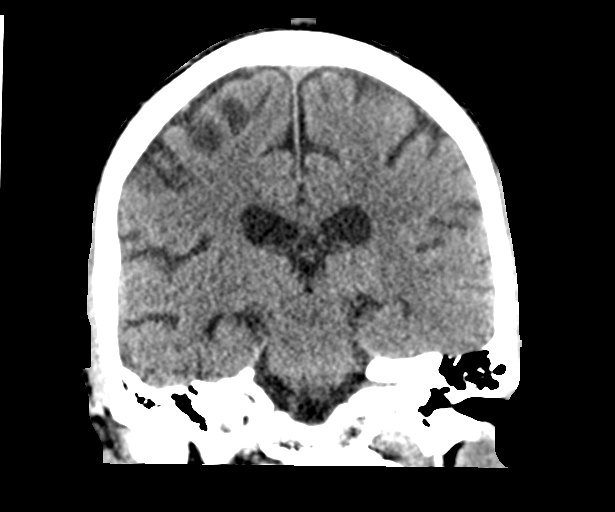

[Series 5: head without sag · sagittal · non-contrast · 0.32mm/px · 3 of 49 slices shown]
[im 17/49  brain]
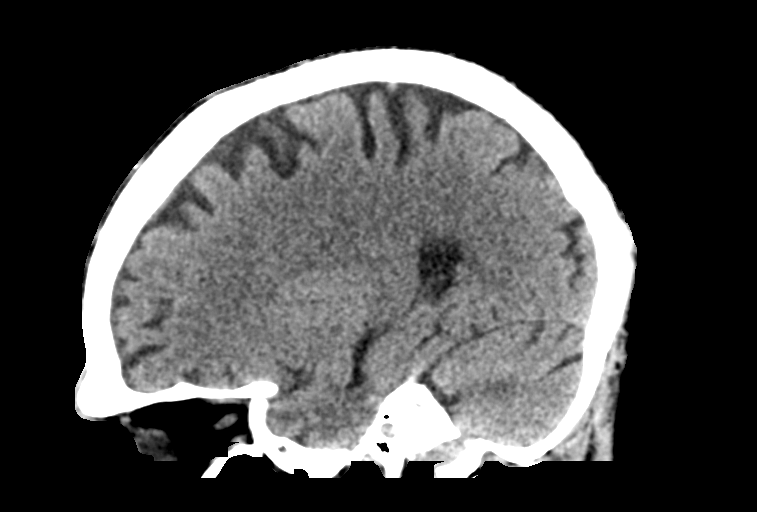
[im 25/49  brain]
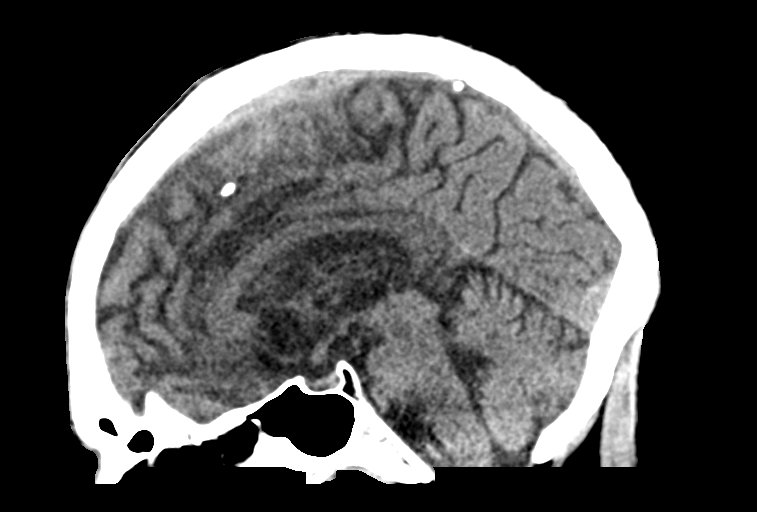
[im 33/49  brain]
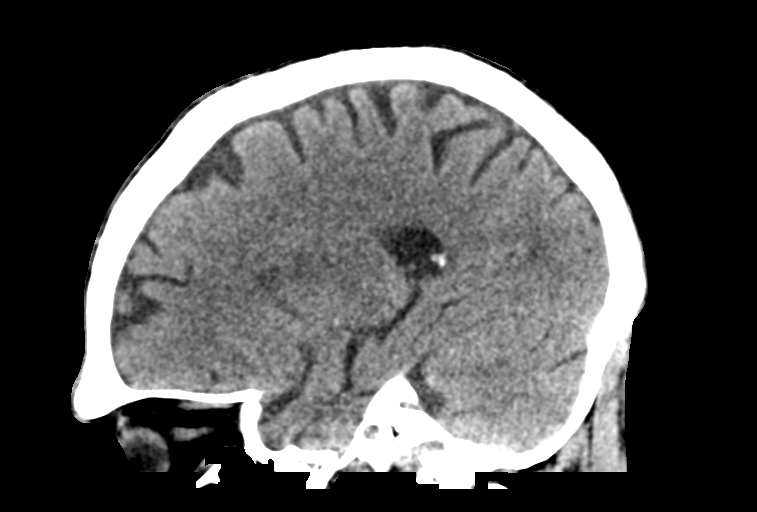

[16 of 47 positions shown; findings below may reference images not displayed]

FINDINGS: Brain: Mild diffuse atrophy as before. No evidence of acute
infarction, hemorrhage, extra-axial collection, ventriculomegaly, or
mass effect.

Vascular: No hyperdense vessel or unexpected calcification.

Skull: Negative for fracture or focal lesion.

Sinuses/Orbits: No acute findings.

Other: None.
IMPRESSION: 1. Mild atrophy. Negative for bleed or other acute intracranial
process.

## 2017-10-08 IMAGING — CR DG ABD PORTABLE 1V
1 series · 1 of 1 positions shown · non-contrast
Comparison: 03/29/2016

CLINICAL DATA: Feeding tube placement

EXAM:
PORTABLE ABDOMEN - 1 VIEW

[AP]
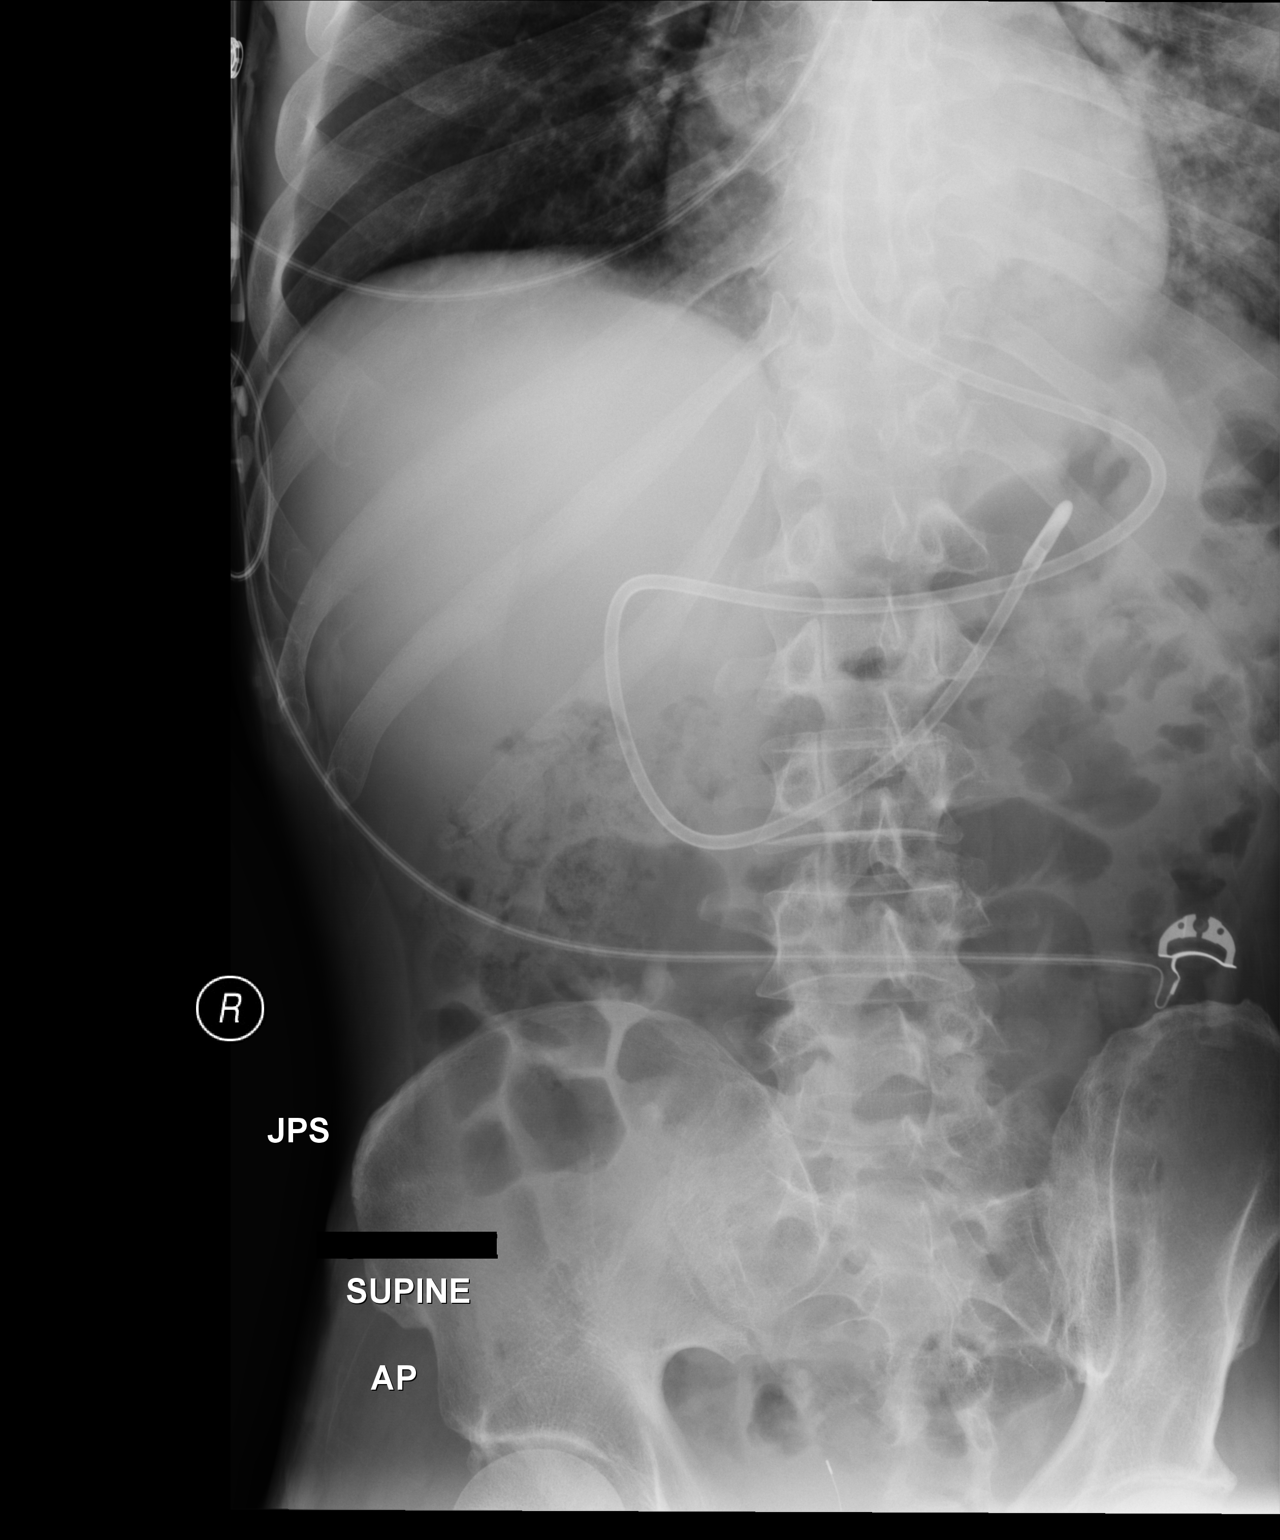

[1 of 1 positions shown; findings below may reference images not displayed]

FINDINGS: Feeding tube tip at the ligament of Treitz. Normal bowel gas
pattern.

Increase in left basilar opacity seen on comparison chest x-ray.
IMPRESSION: 1. Feeding tube tip at the ligament of Treitz.
2. Increase in left basilar airspace disease compared to chest x-ray
earlier today, concerning for aspiration or pneumonia.

## 2017-10-08 IMAGING — DX DG CHEST 1V PORT
1 series · 1 of 1 positions shown · non-contrast
Comparison: 05/09/2016

CLINICAL DATA: Increased shortness of breath tonight.

EXAM:
PORTABLE CHEST 1 VIEW

[chest ap]
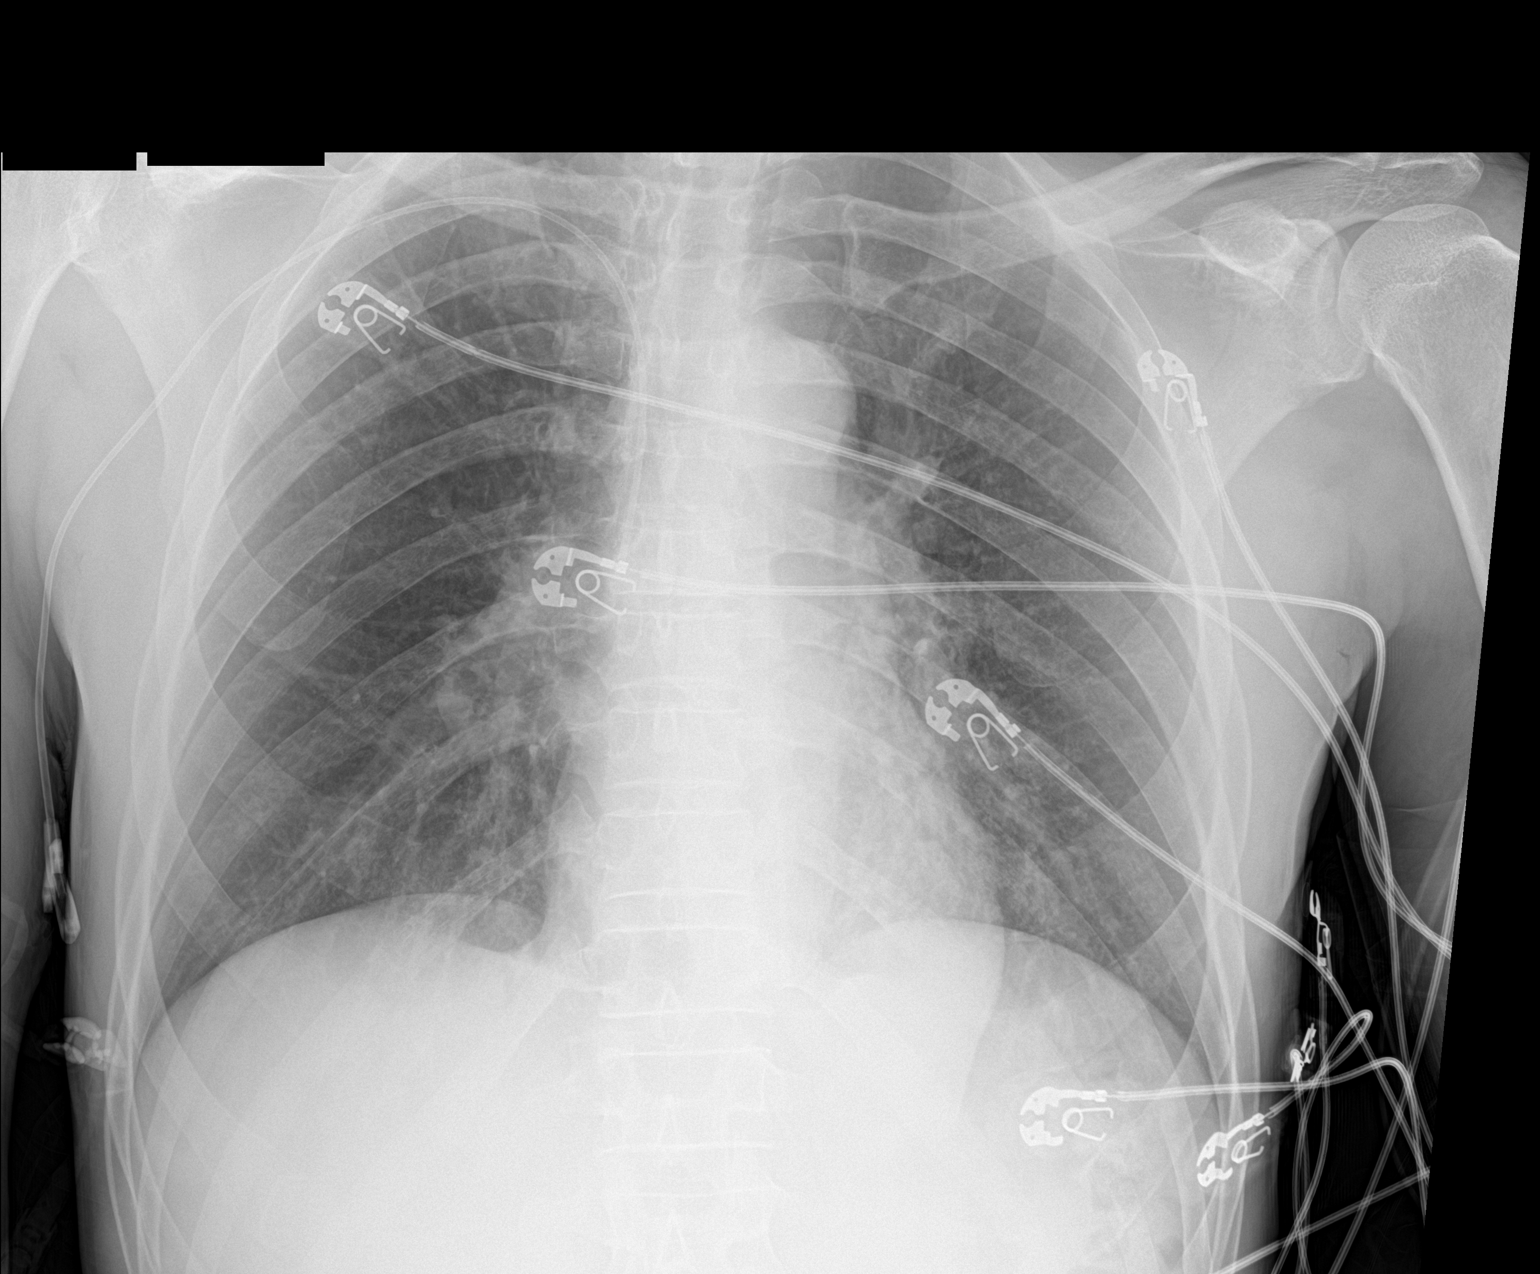

[1 of 1 positions shown; findings below may reference images not displayed]

FINDINGS: PICC line tip in the SVC region. Slightly increased densities at the
lung bases, left side greater than right. Upper lungs are clear.
Heart size is normal and within normal limits. The trachea is
midline.
IMPRESSION: Increased densities along the medial lung bases bilaterally.
Findings could represent atelectasis. Subtle infectious process is
possible.

## 2017-10-09 IMAGING — CR DG CHEST 1V PORT
1 series · 1 of 1 positions shown · non-contrast
Comparison: Chest radiograph from one day prior.

CLINICAL DATA: Pneumonia

EXAM:
PORTABLE CHEST 1 VIEW

[AP]
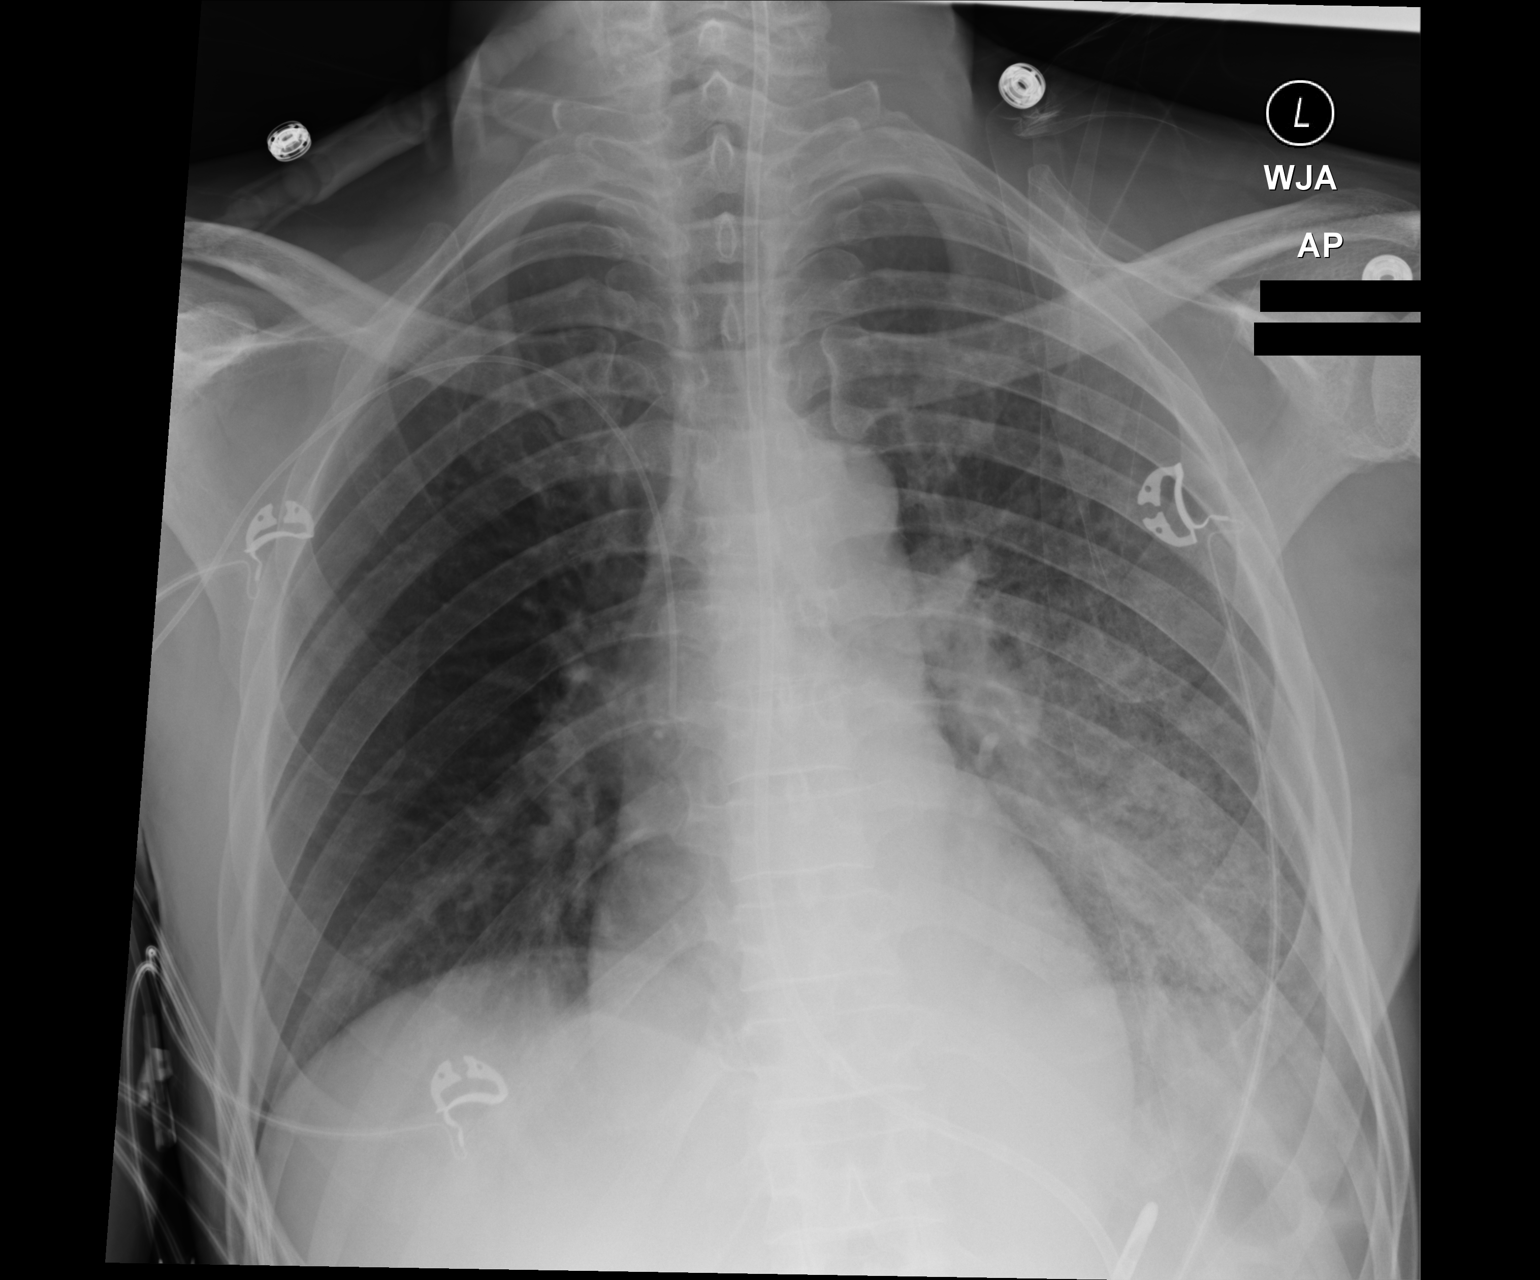

[1 of 1 positions shown; findings below may reference images not displayed]

FINDINGS: Right PICC terminates in the lower third of the superior vena cava.
Enteric tube terminates in the proximal stomach. Stable
cardiomediastinal silhouette with normal heart size. No
pneumothorax. No pleural effusion. Patchy consolidation in the mid
to lower left lung is increased.
IMPRESSION: Increased patchy consolidation in the mid to lower left lung, most
consistent with pneumonia.

## 2017-10-12 IMAGING — CR DG CHEST 1V PORT
1 series · 1 of 1 positions shown · non-contrast
Comparison: 05/21/2016.

CLINICAL DATA: Respiratory failure.

EXAM:
PORTABLE CHEST 1 VIEW

[AP]
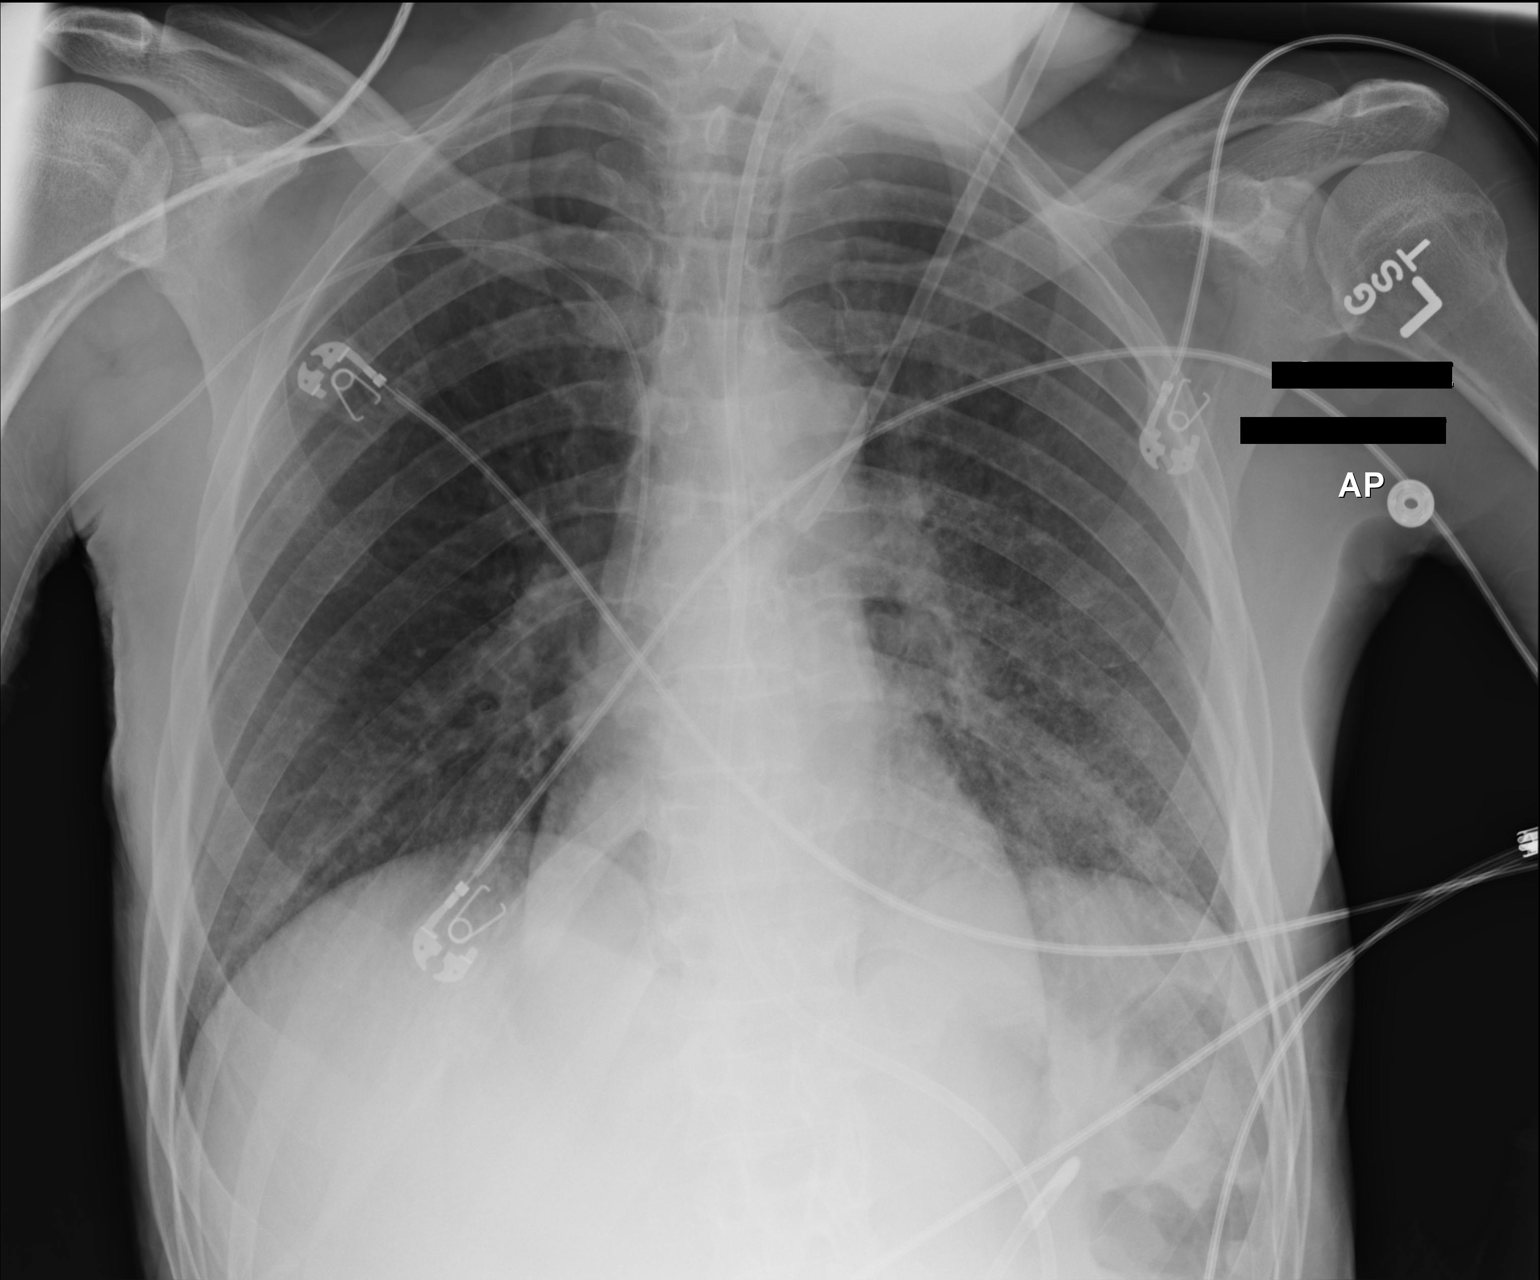

[1 of 1 positions shown; findings below may reference images not displayed]

FINDINGS: Feeding tube and right PICC line stable position. Bibasilar
atelectasis. Left lower lobe infiltrate with slight interim
improvement. No pleural effusion or pneumothorax.
IMPRESSION: 1.  Lines and tubes stable position.

2. Low lung volumes with bibasilar atelectasis. Left lower lobe
infiltrate with slight improvement from prior exam .

## 2017-10-13 IMAGING — CR DG ABD PORTABLE 1V
1 series · 1 of 1 positions shown · non-contrast
Comparison: Abdominal radiograph performed 05/20/2016

CLINICAL DATA: Nasogastric tube placement.  Initial encounter.

EXAM:
PORTABLE ABDOMEN - 1 VIEW

[AP]
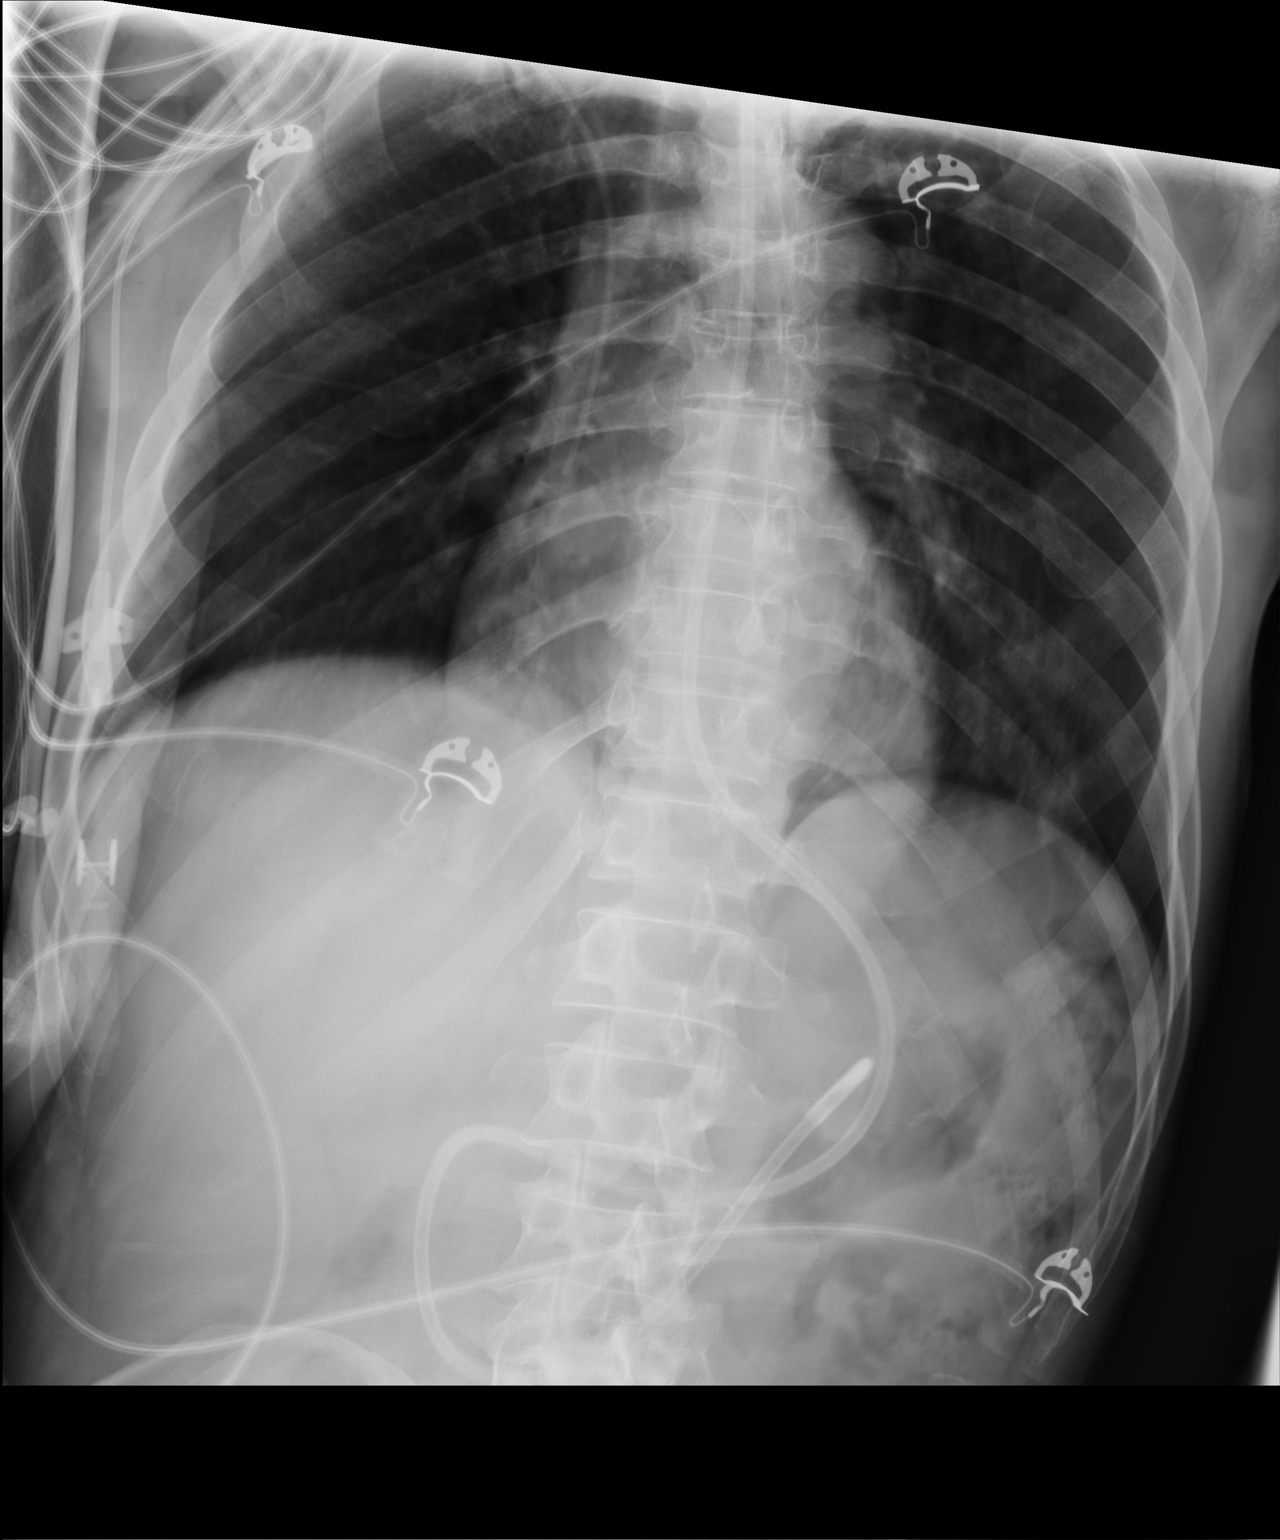

[1 of 1 positions shown; findings below may reference images not displayed]

FINDINGS: The patient's enteric tube is seen coiling within the stomach. This
will likely progress further with time.

The visualized bowel gas pattern is unremarkable. Scattered air and
stool filled loops of colon are seen; no abnormal dilatation of
small bowel loops is seen to suggest small bowel obstruction. No
free intra-abdominal air is identified, though evaluation for free
air is limited on a single supine view.

The visualized osseous structures are within normal limits; the
sacroiliac joints are unremarkable in appearance. The visualized
portions of the lungs are essentially clear.
IMPRESSION: Enteric tube noted coiling within the stomach. This will likely
progress further with time.

## 2017-10-15 IMAGING — CR DG CHEST 1V PORT
1 series · 1 of 1 positions shown · non-contrast
Comparison: 05/24/2016 and earlier.

CLINICAL DATA: 49-year-old male with fever, acute encephalopathy,
seizure. Streptococcus meningitis in [REDACTED], with hospital course at
that time complicated by cardiac arrest and cardiogenic shock.
Initial encounter.

EXAM:
PORTABLE CHEST 1 VIEW

[AP]
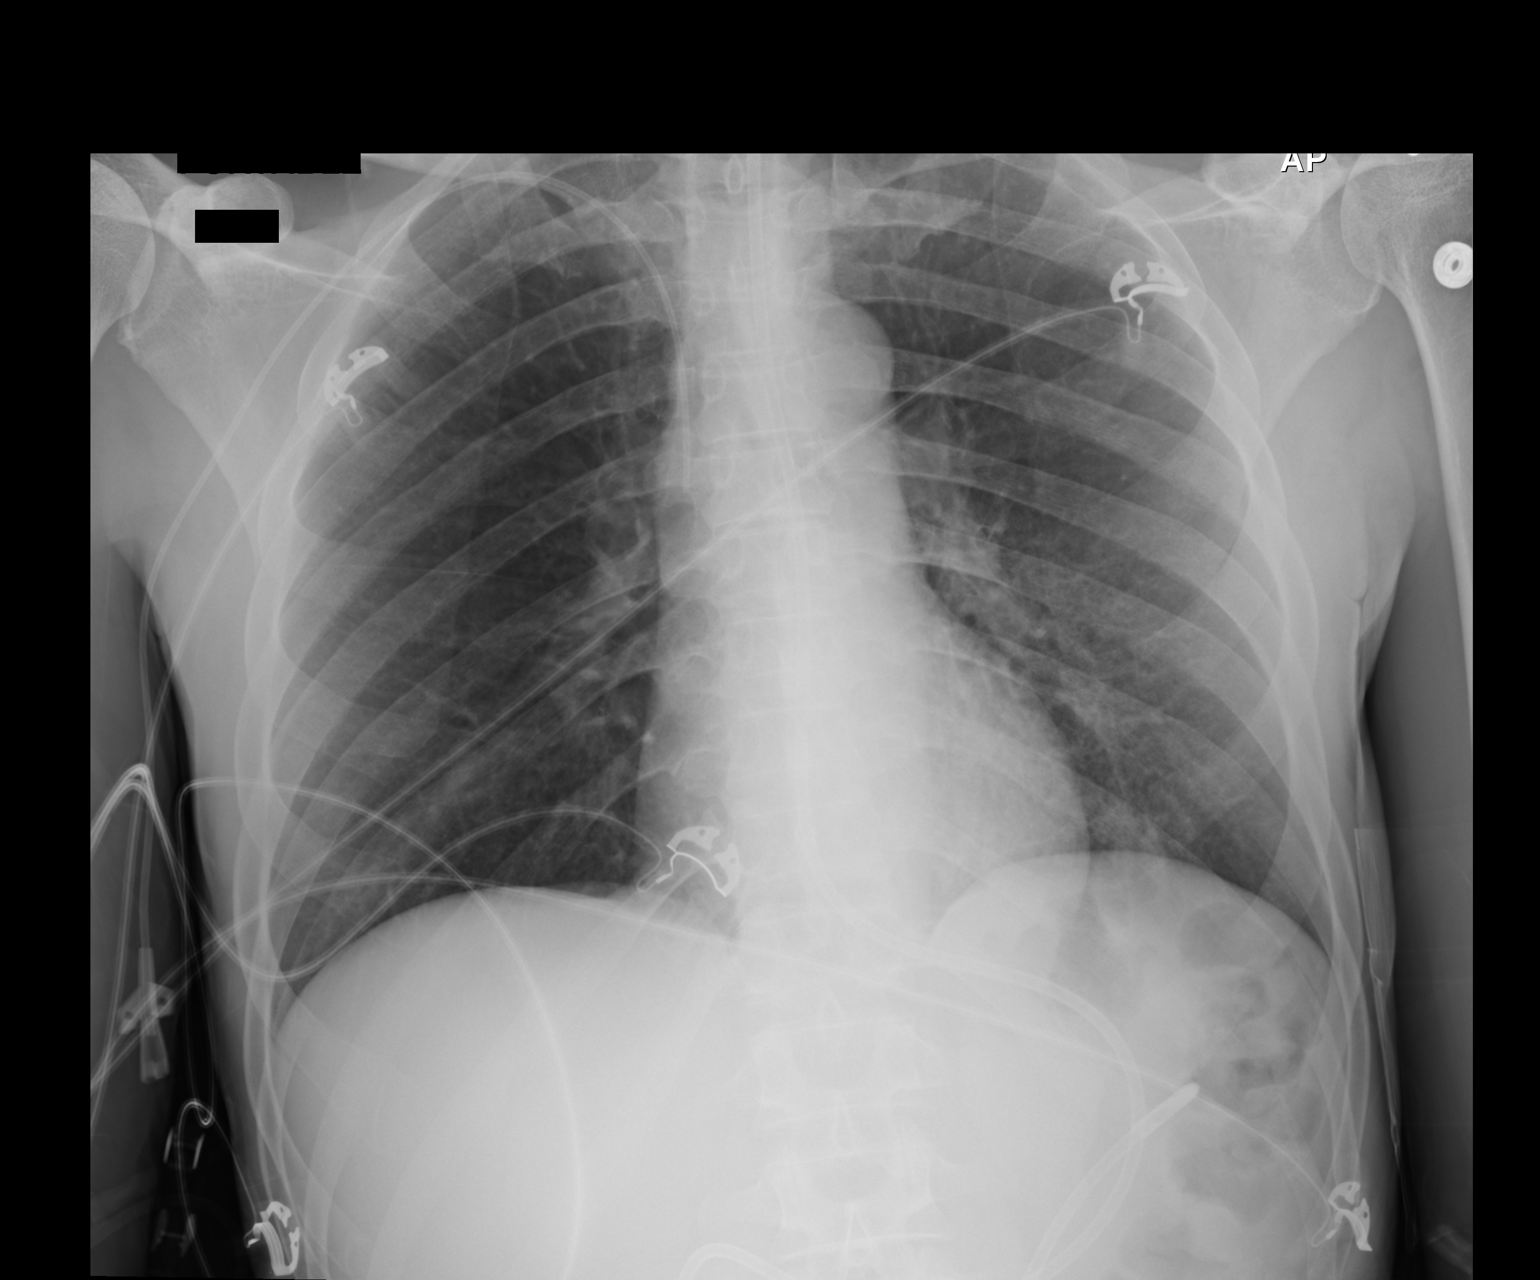

[1 of 1 positions shown; findings below may reference images not displayed]

FINDINGS: Portable AP semi upright view at 2221 hours. Feeding tube in place,
appears to loop in the stomach with tip pointed slightly retrograde
at the proximal gastric body (arrow). Right PICC line remains in
place.

Asymmetric reticulonodular opacity in the left mid and lower lung
has mildly regressed. Upper lobes remain clear. Right lung remains
clear. No pneumothorax, pulmonary edema, pleural effusion or areas
of worsening ventilation. Mediastinal contours remain normal.
IMPRESSION: 1. Regressed but not resolved left mid and lower lung
reticulonodular opacity compatible with improving left lung
infection.
2. No pleural effusion or new cardiopulmonary abnormality.
3. Feeding tube looped in the stomach.

## 2017-10-15 IMAGING — CT CT ABDOMEN W/O CM
2 of 4 series · 17 of 46 positions shown, 19 images · non-contrast
Comparison: None.

CLINICAL DATA: Septic shock and anatomic assessment prior to
possible percutaneous gastrostomy tube placement.

EXAM:
CT ABDOMEN WITHOUT CONTRAST
TECHNIQUE: Multidetector CT imaging of the abdomen was performed following the
standard protocol without IV contrast.

[Series 2: abd/ pelvis 5.0 i30f 1 · axial · 0.71mm/px · z∈[+988,+1198]mm · 14 of 48 slices shown, 16 images]
[im 3/48  soft-tissue]
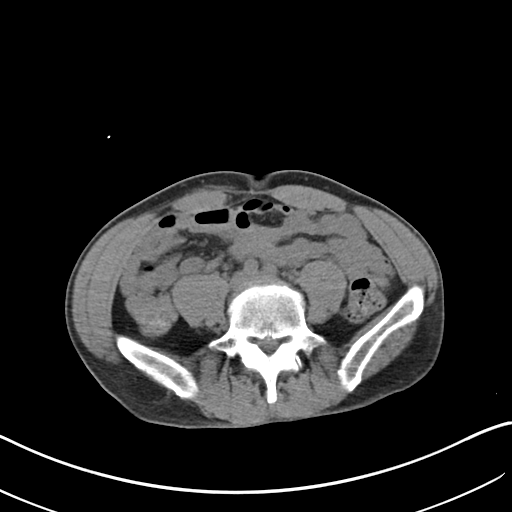
[im 3/48  bone]
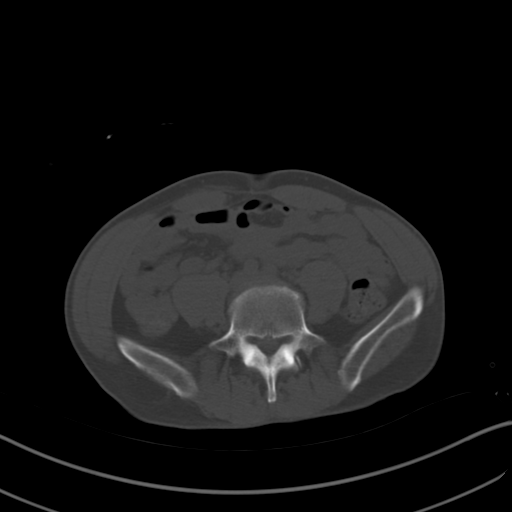
[im 8/48  soft-tissue]
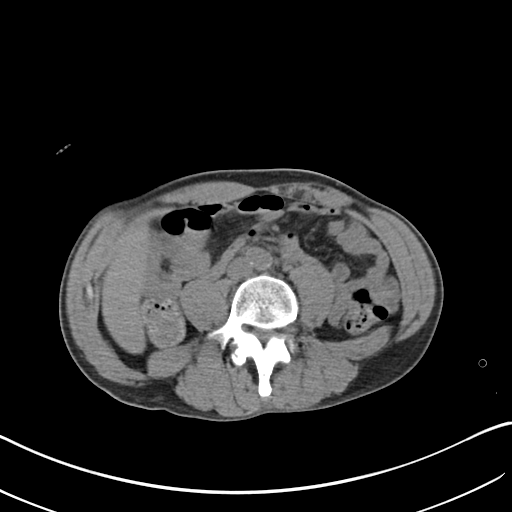
[im 10/48  soft-tissue]
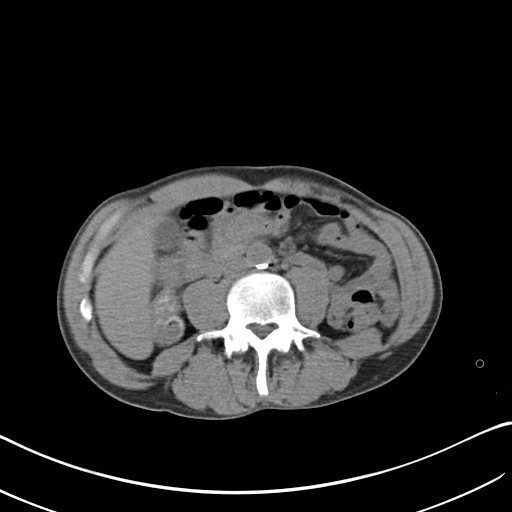
[im 12/48  soft-tissue]
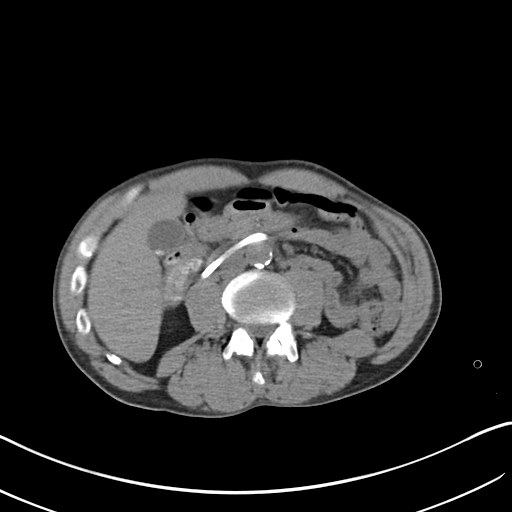
[im 17/48  soft-tissue]
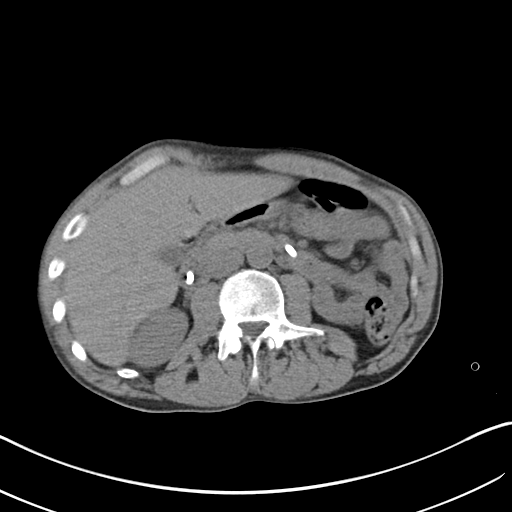
[im 19/48  soft-tissue]
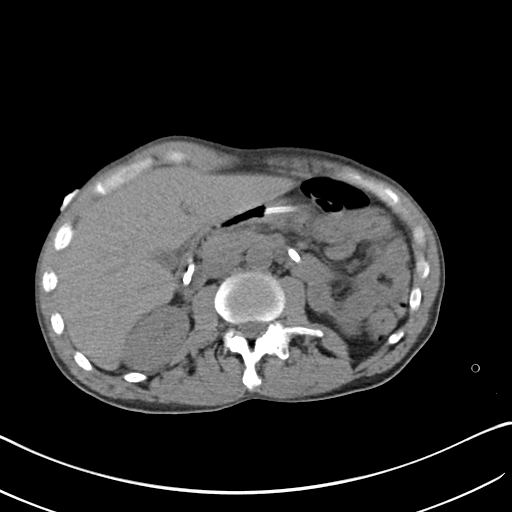
[im 22/48  soft-tissue]
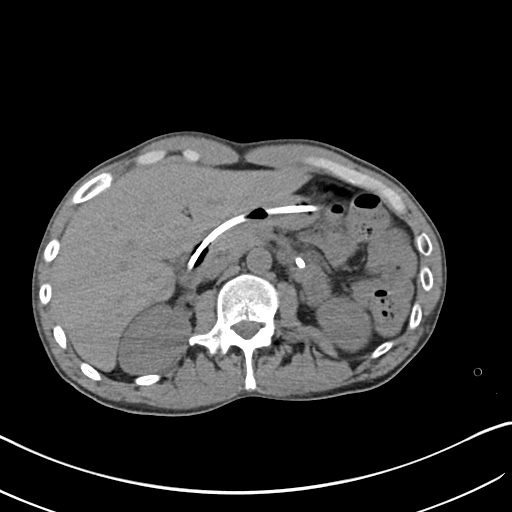
[im 26/48  soft-tissue]
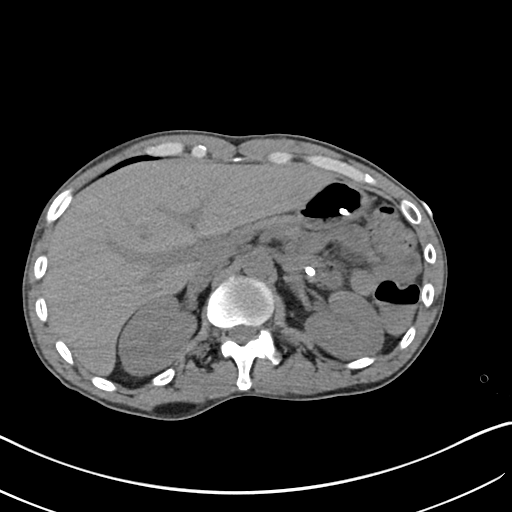
[im 29/48  soft-tissue]
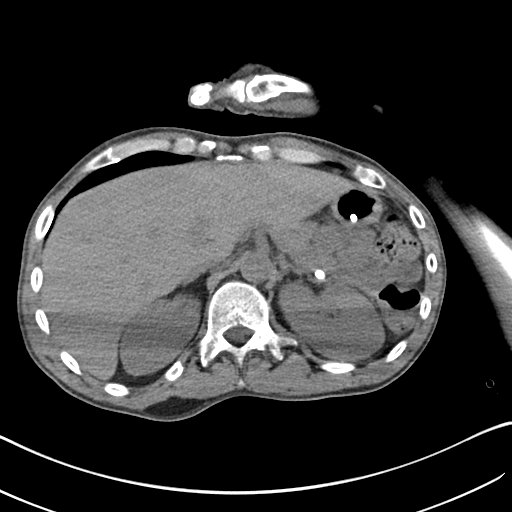
[im 29/48  bone]
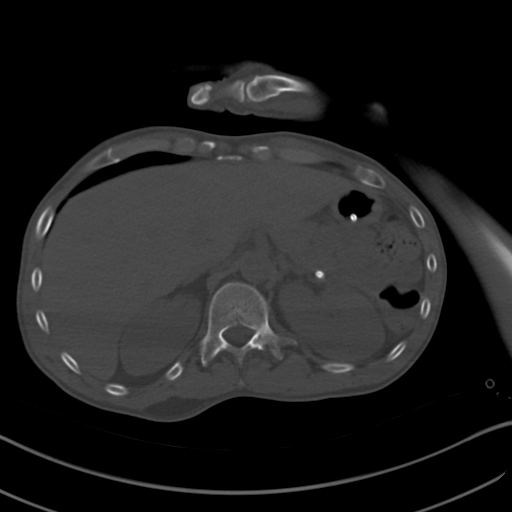
[im 31/48  soft-tissue]
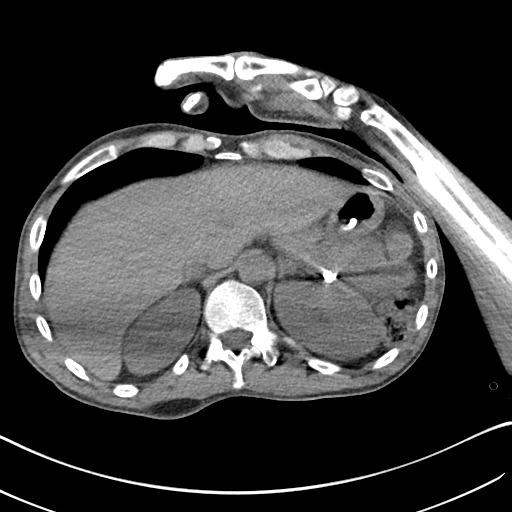
[im 36/48  soft-tissue]
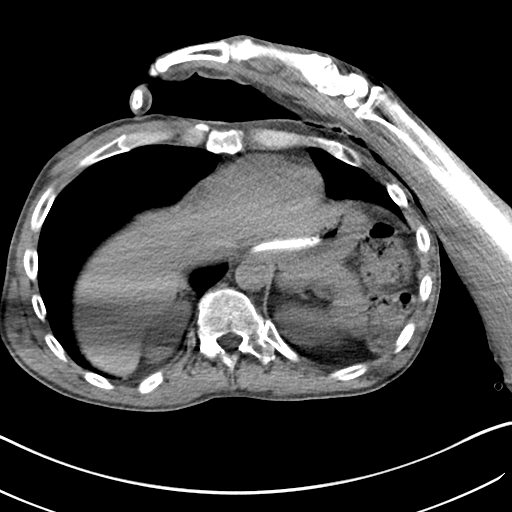
[im 38/48  soft-tissue]
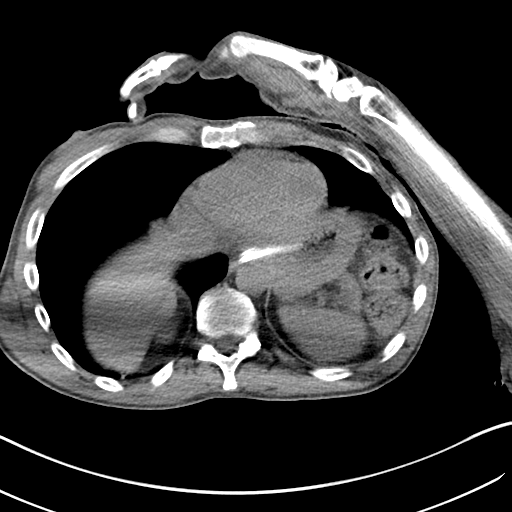
[im 40/48  soft-tissue]
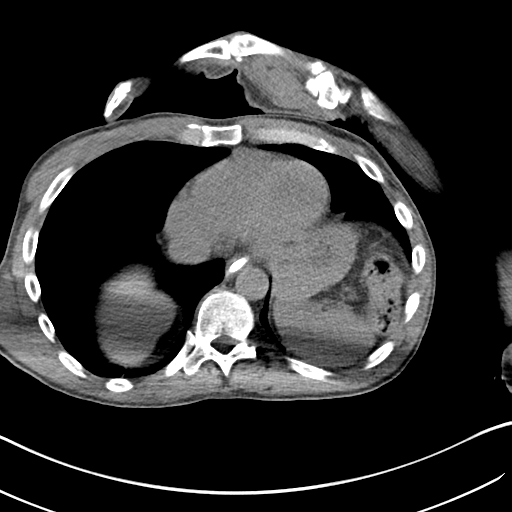
[im 45/48  soft-tissue]
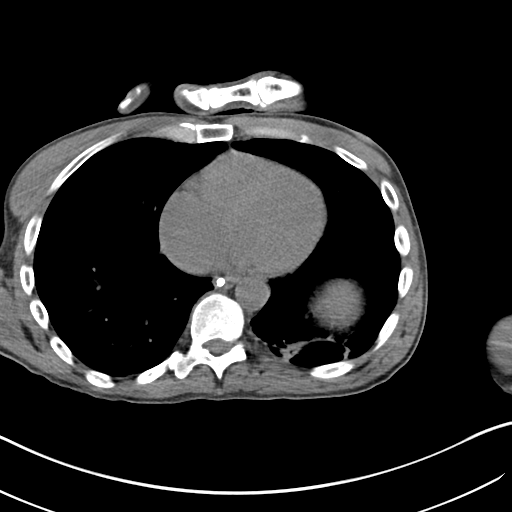

[Series 5: cor st · coronal · 0.59mm/px · 3 of 101 slices shown]
[im 34/101  soft-tissue]
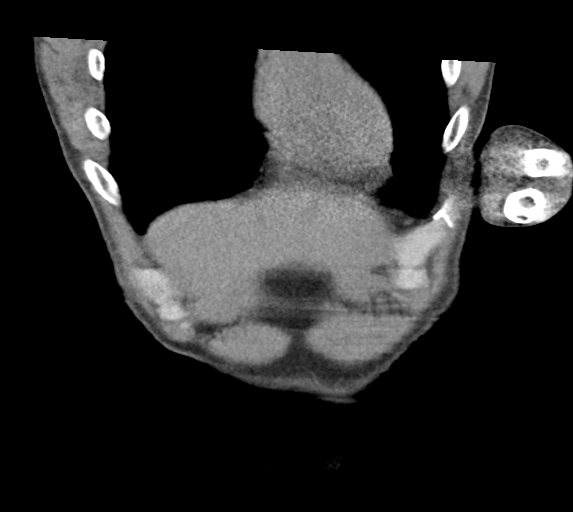
[im 45/101  soft-tissue]
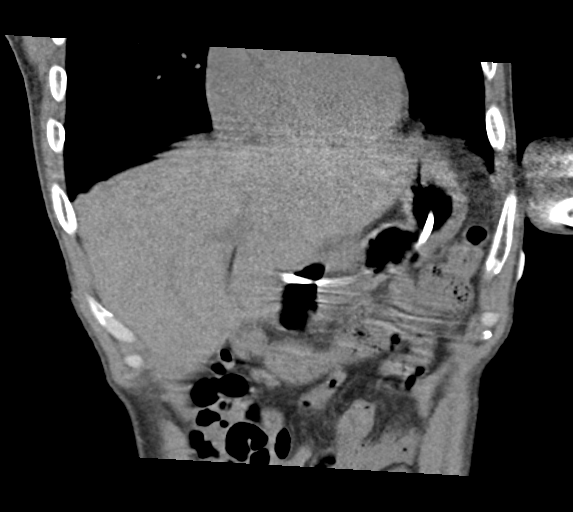
[im 56/101  soft-tissue]
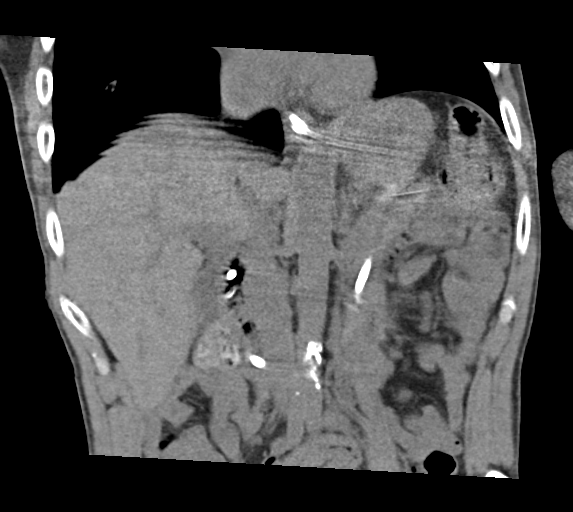

[17 of 46 positions shown; findings below may reference images not displayed]

FINDINGS: Lower chest:  Left basilar atelectasis present.

Hepatobiliary: Unremarkable unenhanced appearance of the liver and
gallbladder.

Pancreas: Unremarkable unenhanced appearance of the pancreas.

Spleen: Unremarkable spleen.

Adrenals/Urinary Tract: No evidence of hydronephrosis or urinary
tract calculi.

Stomach/Bowel: A feeding tube is present which extends through the
stomach and duodenum and into the proximal jejunum. The stomach is
very high in location and transversely oriented with much of the
stomach posterior to the left lobe of the liver. No evidence of
bowel obstruction, free air or abscess.

Vascular/Lymphatic: No enlarged lymph nodes are identified. Sparse
calcified plaque of the abdominal aorta present consistent with
atherosclerosis.

Other: No hernias.

Musculoskeletal: Unremarkable visualized bony structures.
IMPRESSION: 1. Transverse stomach with much of the stomach posterior to the left
lobe of the liver. Although this may make percutaneous gastrostomy
tube placement more difficult, with insufflation of the stomach,
there may be a safe window present to perform gastrostomy tube
placement.
2. Aortic atherosclerosis.
3. Current feeding tube extends into the proximal jejunum.

## 2017-11-04 IMAGING — DX DG CHEST 1V PORT
1 series · 2 of 2 positions shown · non-contrast
Comparison: Portable chest x-ray earlier same date [DATE] p.m. and
previously.

CLINICAL DATA: Intubation.  Followup pneumonia.

EXAM:
PORTABLE CHEST 1 VIEW [DATE] p.m.:

[Series 1: chest ap · 0.14mm/px · 2 of 2 slices shown]
[im 1/2]
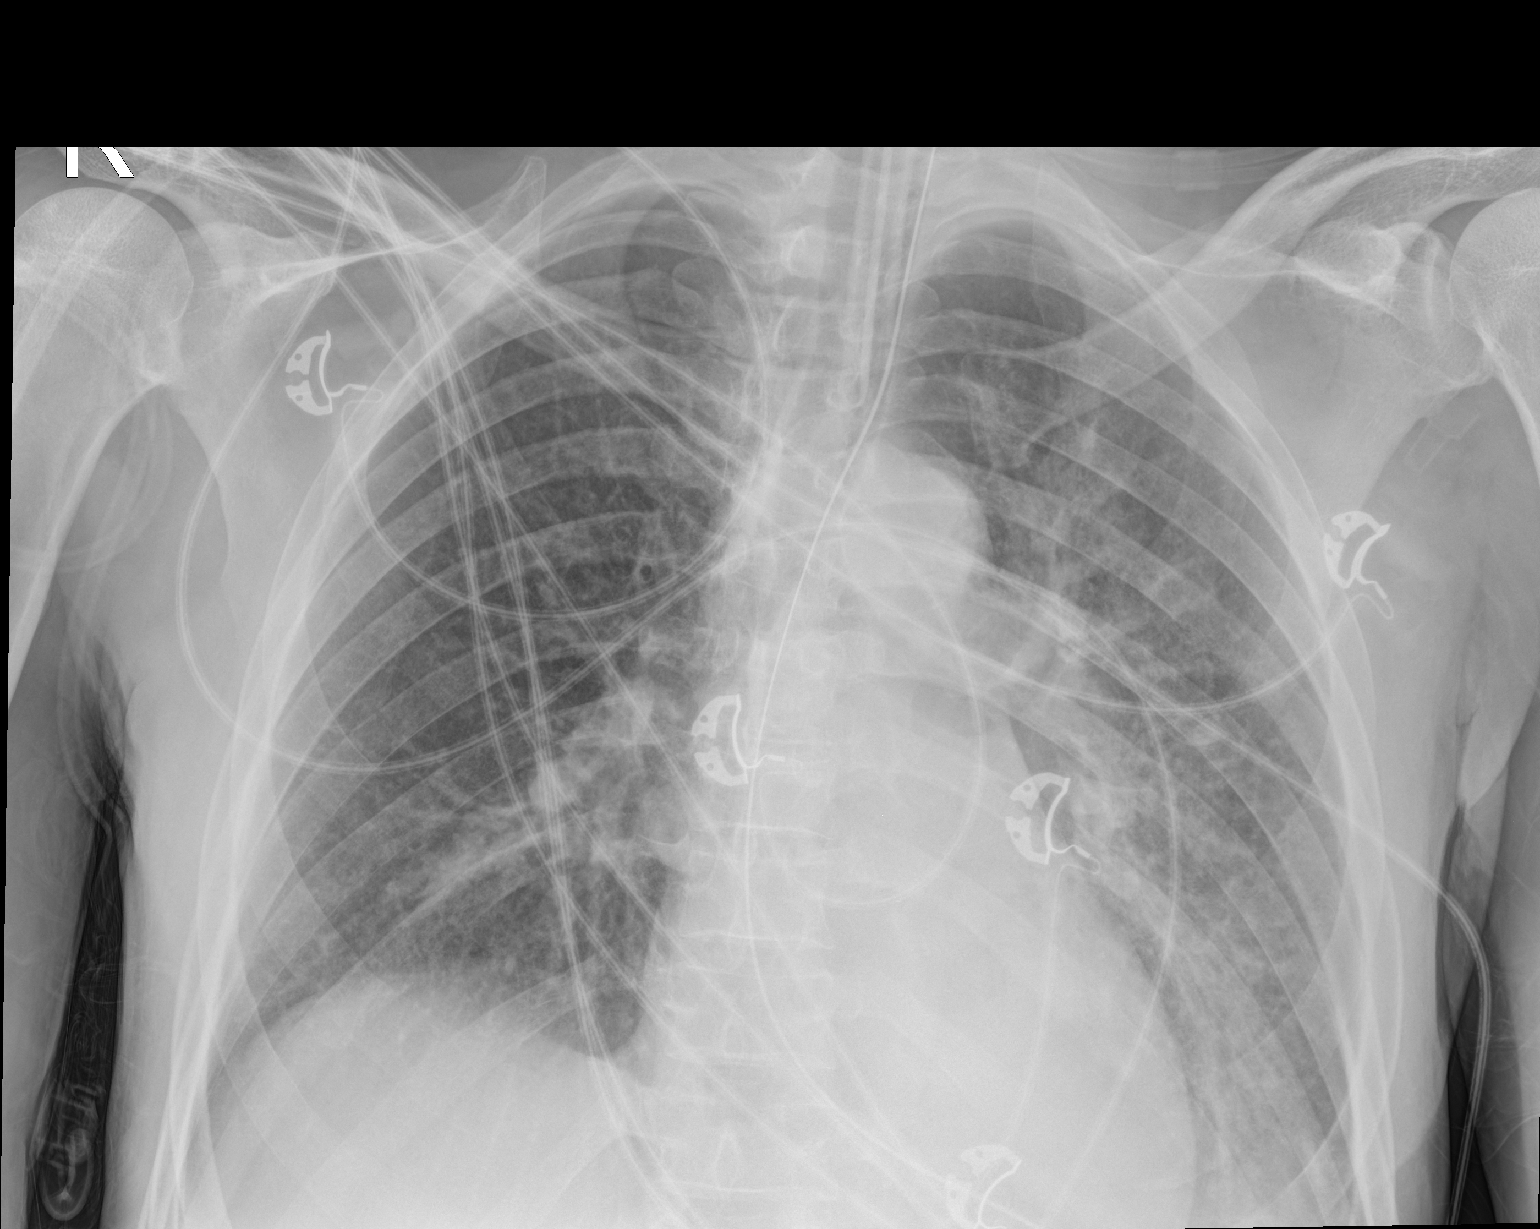
[im 2/2]
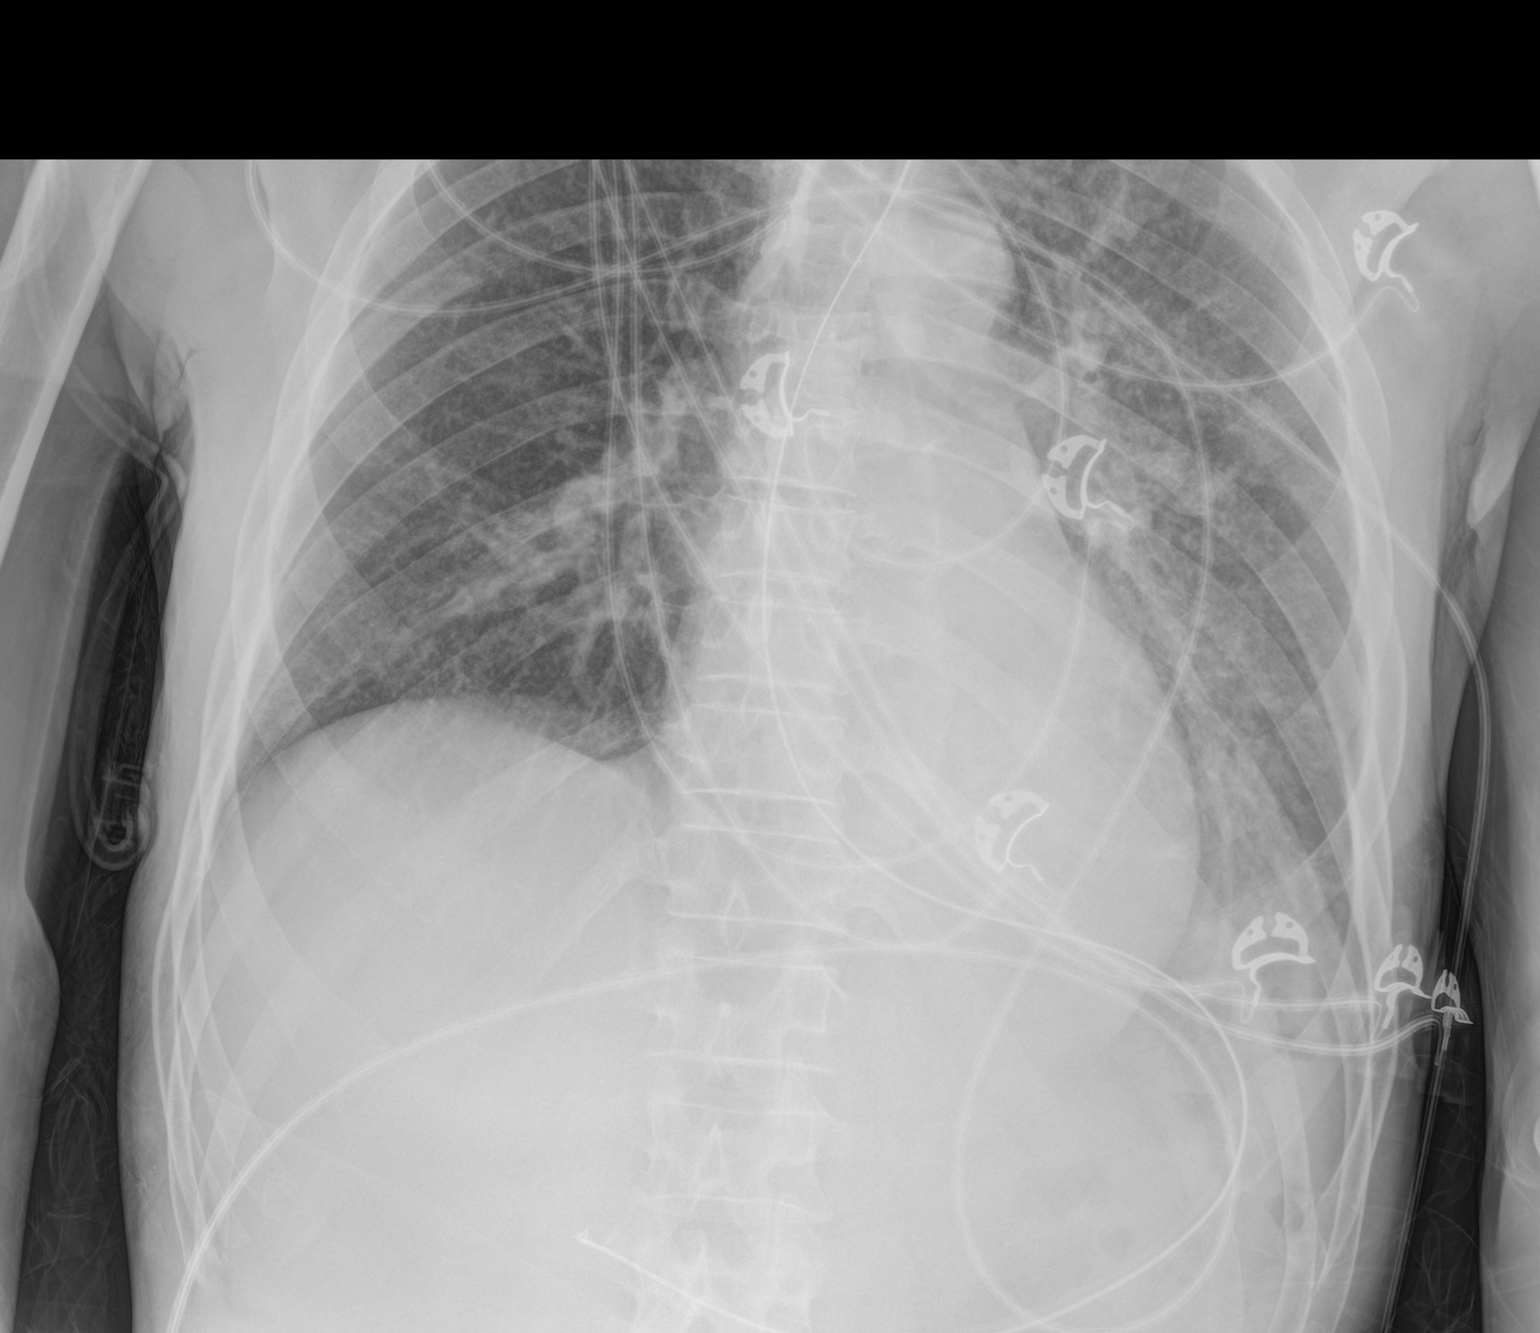

[2 of 2 positions shown; findings below may reference images not displayed]

FINDINGS: Endotracheal tube tip in satisfactory position projecting
approximately 6 cm above the carina. Nasogastric tube courses below
the diaphragm into the stomach though its tip is not included on the
image. Consolidation in the left lower lobe as noted earlier.
Developing patchy opacities at the right lung base.
IMPRESSION: 1. Support apparatus satisfactory. Endotracheal tube tip
approximately 6 cm above the carina. Nasogastric tube courses below
the diaphragm into the stomach.
2. Left lower lobe pneumonia as noted earlier. Developing
atelectasis and/or pneumonia involving the right lung base.

## 2017-11-04 IMAGING — DX DG CHEST 1V PORT
1 series · 1 of 1 positions shown · non-contrast
Comparison: 06/16/2016

CLINICAL DATA: Endotracheal tube placement

EXAM:
PORTABLE CHEST 1 VIEW

[chest ap]
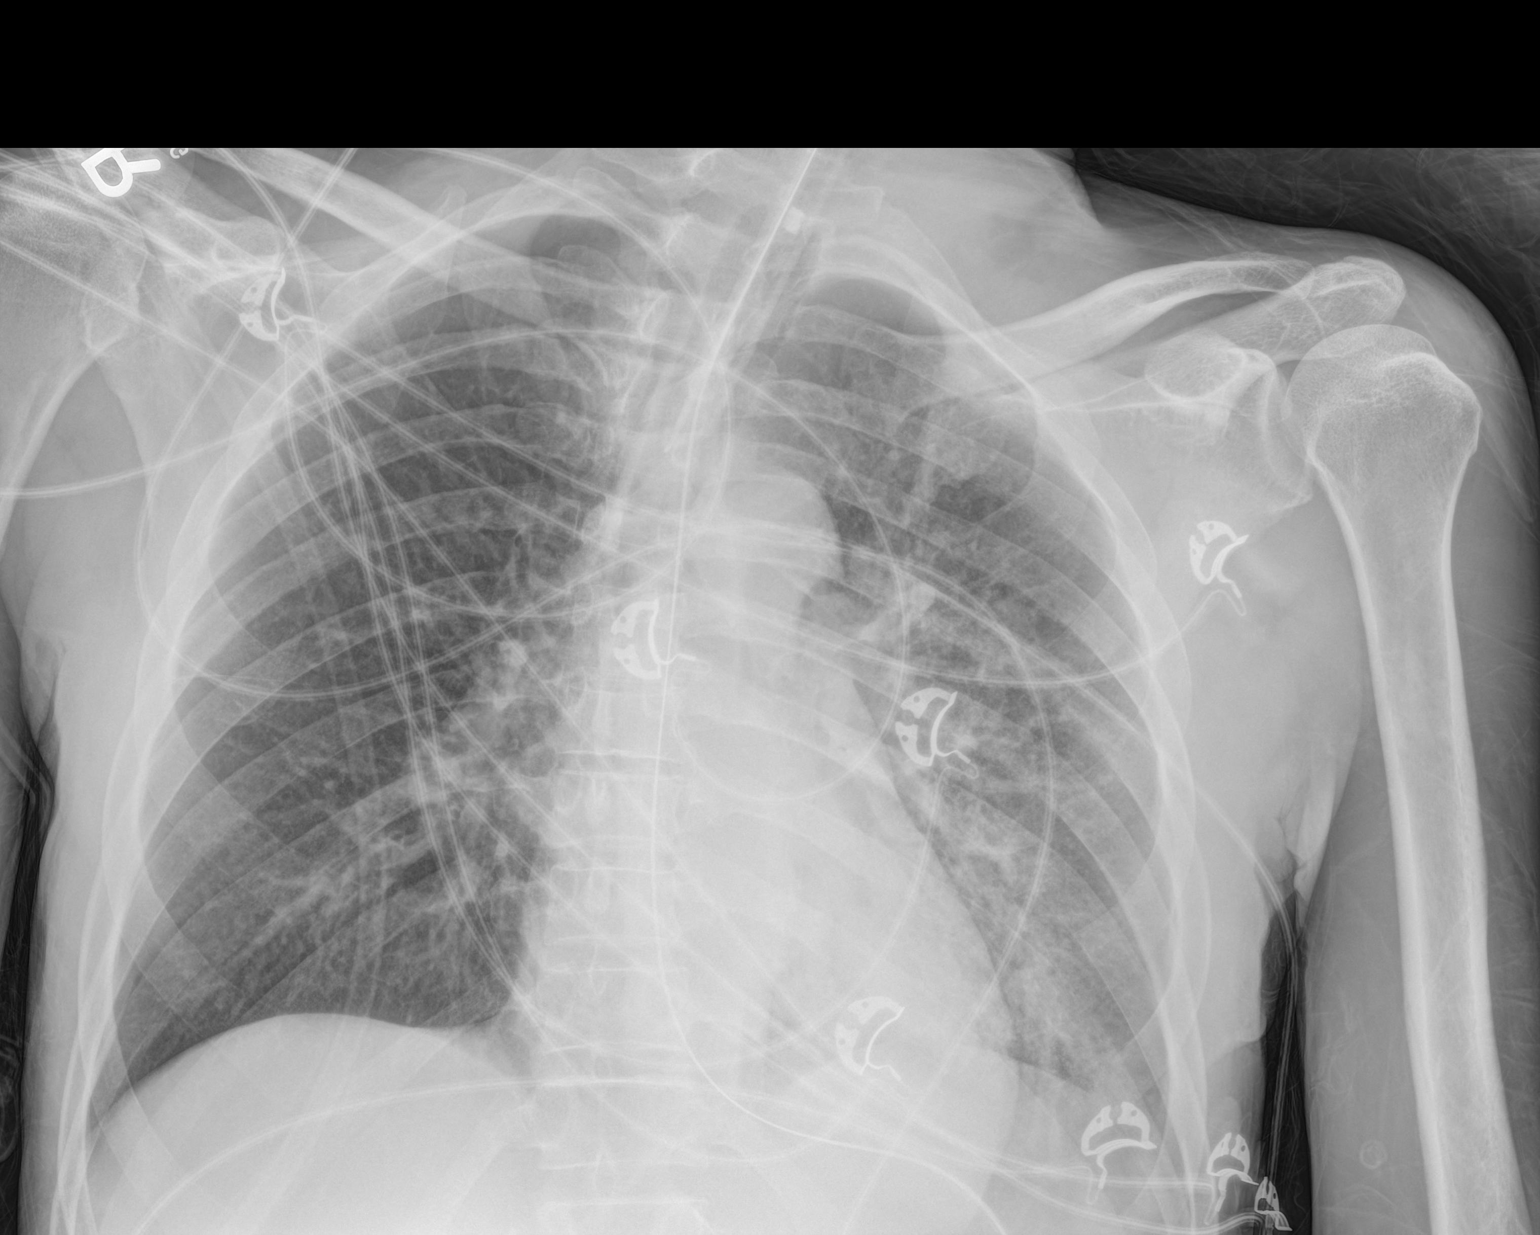

[1 of 1 positions shown; findings below may reference images not displayed]

FINDINGS: There is an endotracheal tube with the tip 3.7 cm above the carina.
Nasogastric tube coursing below the diaphragm.

There is hazy left lower lobe airspace disease. There is no pleural
effusion or pneumothorax. The heart and mediastinal contours are
unremarkable.

The osseous structures are unremarkable.
IMPRESSION: 1. Hazy left lower lobe airspace disease concerning for pneumonia.
2. Endotracheal tube with the tip 3.7 cm above the carina.

## 2017-11-04 IMAGING — DX DG CHEST 1V PORT
1 series · 1 of 1 positions shown · non-contrast
Comparison: 05/29/2016, 05/27/2016 and earlier.

CLINICAL DATA: 49-year-old with possible aspiration and sepsis.

EXAM:
PORTABLE CHEST 1 VIEW

[chest ap]
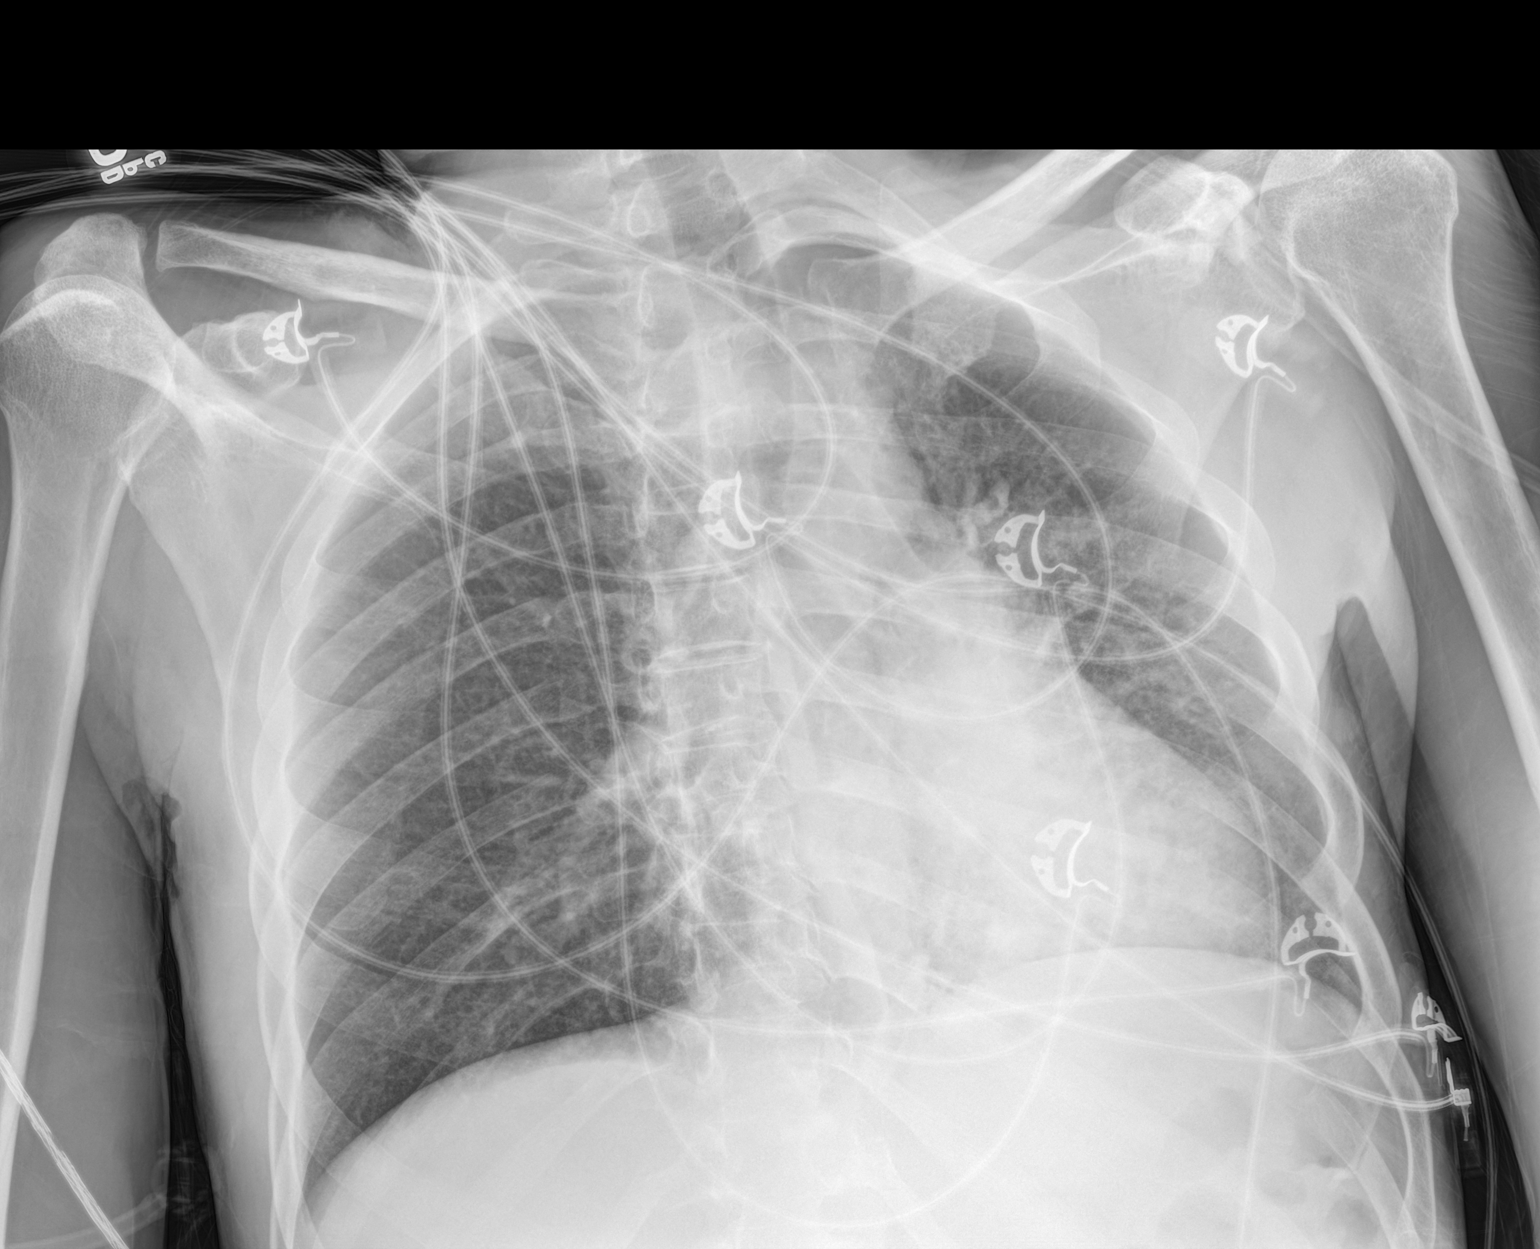

[1 of 1 positions shown; findings below may reference images not displayed]

FINDINGS: Patient is rotated to the left. Cardiomediastinal silhouette
unremarkable, unchanged. Airspace consolidation in the retrocardiac
left lung base. Lungs otherwise clear. Pulmonary vascularity normal.
No visible pleural effusions.
IMPRESSION: Left lower lobe pneumonia.

## 2017-11-05 IMAGING — CT CT HEAD W/O CM
4 series · 17 of 47 positions shown, 19 images · non-contrast
Comparison: 05/19/2016

CLINICAL DATA: Unresponsive

EXAM:
CT HEAD WITHOUT CONTRAST
TECHNIQUE: Contiguous axial images were obtained from the base of the skull
through the vertex without intravenous contrast.

[Series 2: head bone · axial · 0.44mm/px · z∈[-46,+12]mm · 4 of 84 slices shown]
[im 9/84  bone]
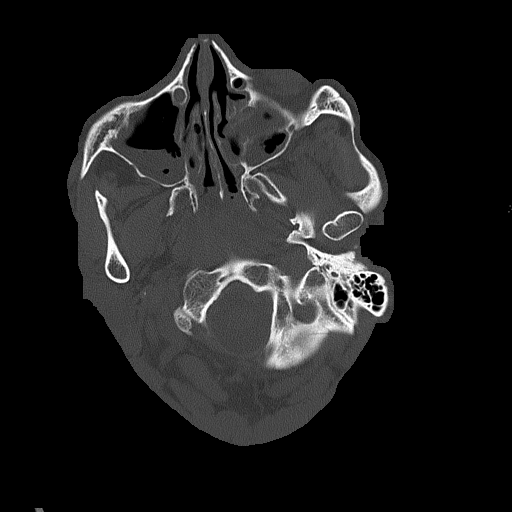
[im 17/84  bone]
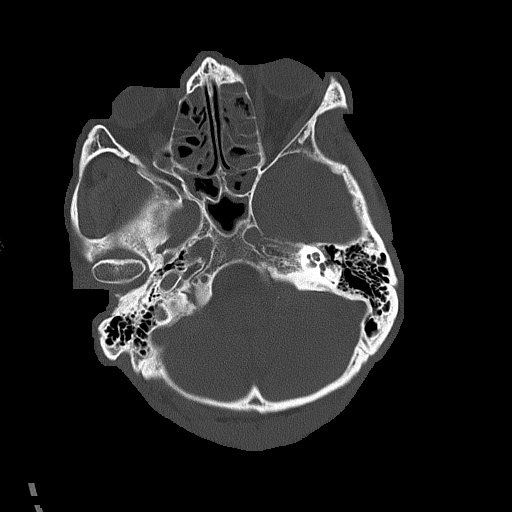
[im 25/84  bone]
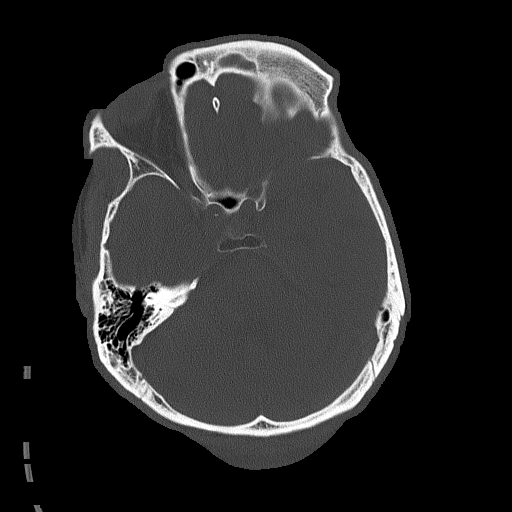
[im 38/84  bone]
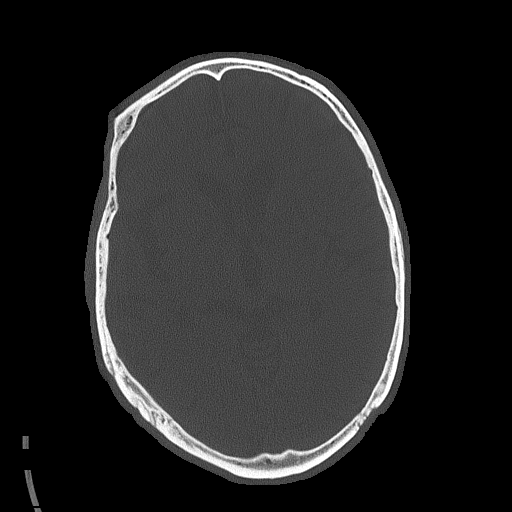

[Series 3: head without sag · sagittal · non-contrast · 0.33mm/px · 3 of 67 slices shown]
[im 23/67  brain]
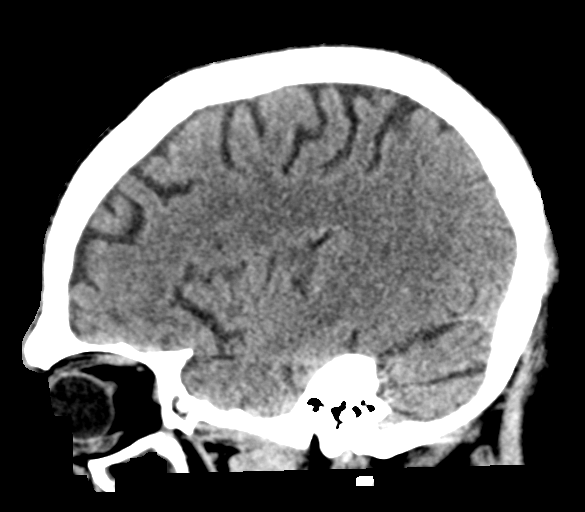
[im 34/67  brain]
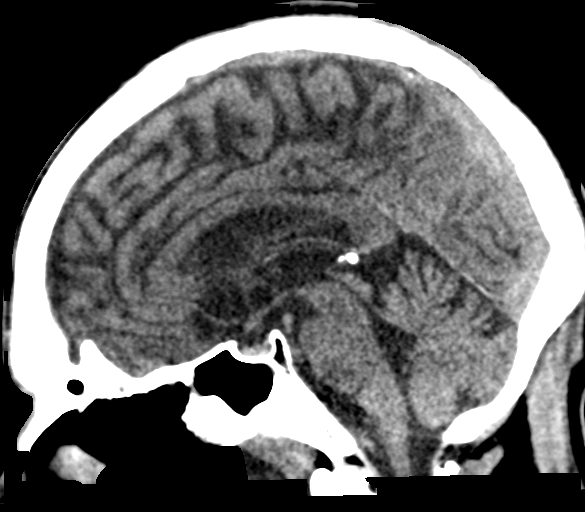
[im 45/67  brain]
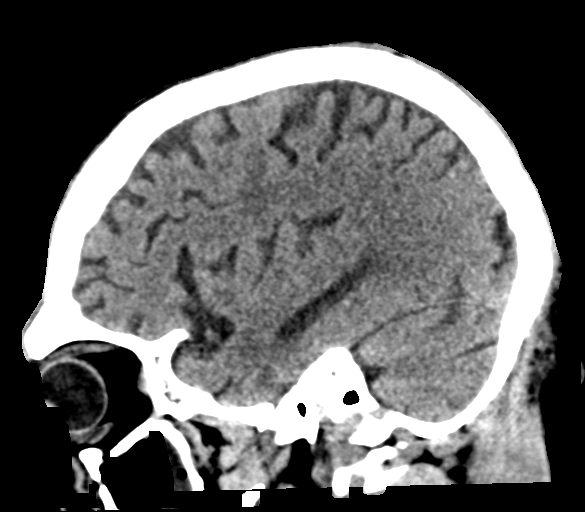

[Series 4: head without cor · coronal · non-contrast · 0.32mm/px · 3 of 67 slices shown]
[im 23/67  brain]
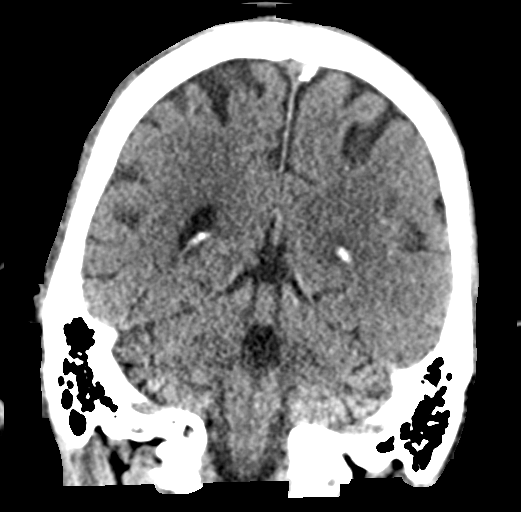
[im 30/67  brain]
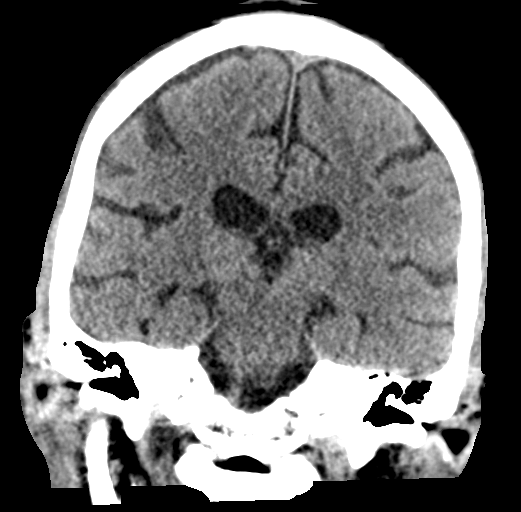
[im 37/67  brain]
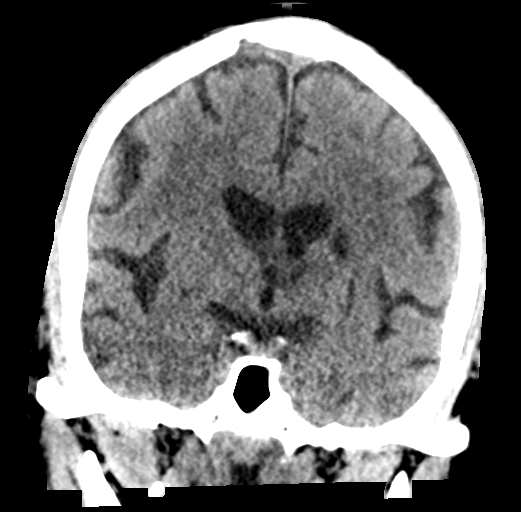

[Series 5: head without · axial · non-contrast · 0.44mm/px · z∈[-43,+77]mm · 7 of 34 slices shown, 9 images]
[im 5/34  brain]
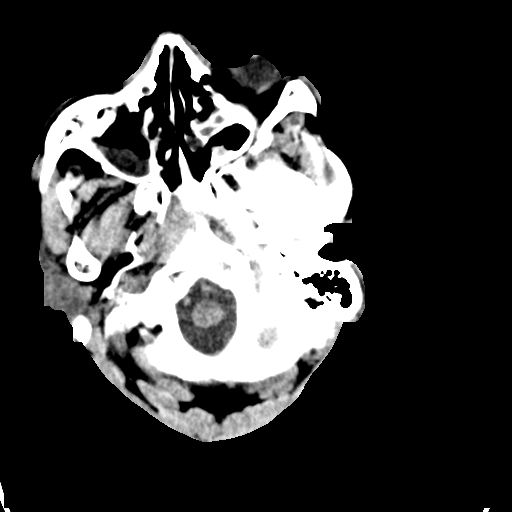
[im 5/34  bone]
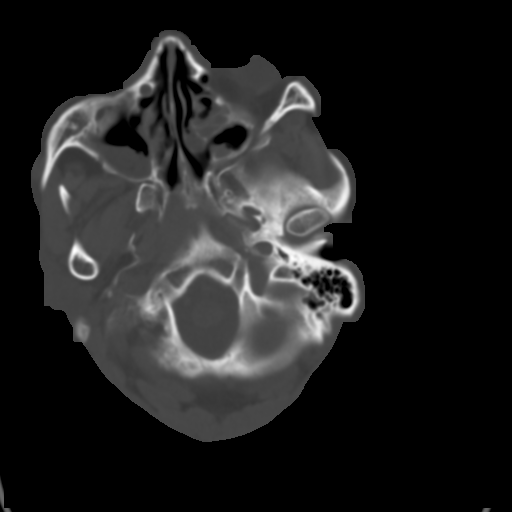
[im 9/34  brain]
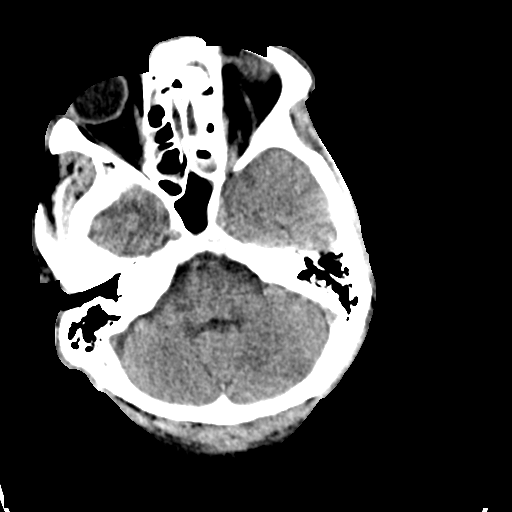
[im 13/34  brain]
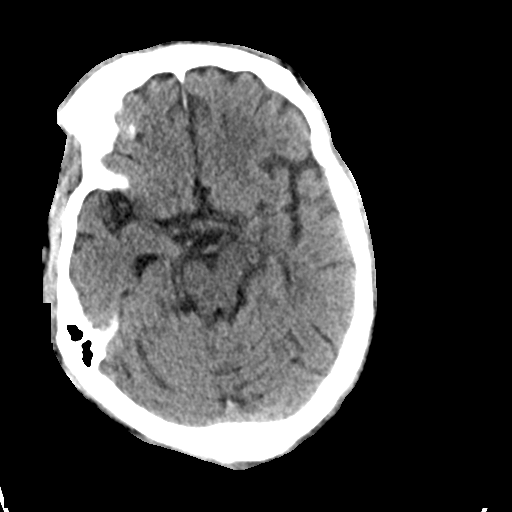
[im 17/34  brain]
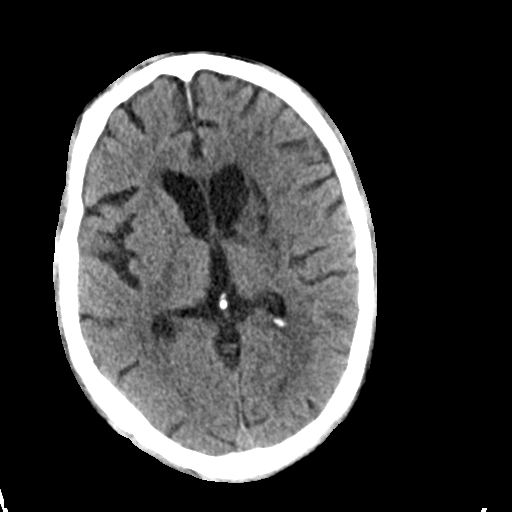
[im 21/34  brain]
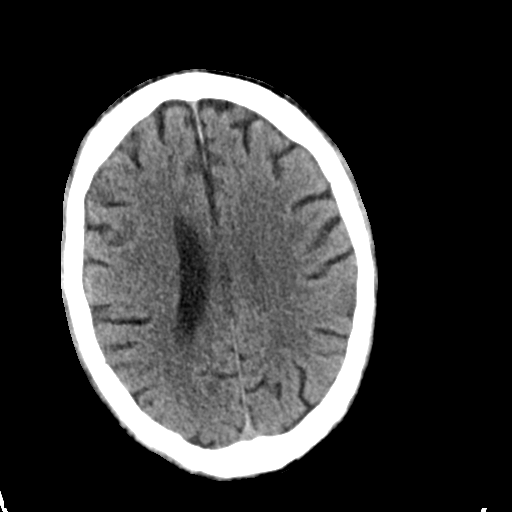
[im 21/34  bone]
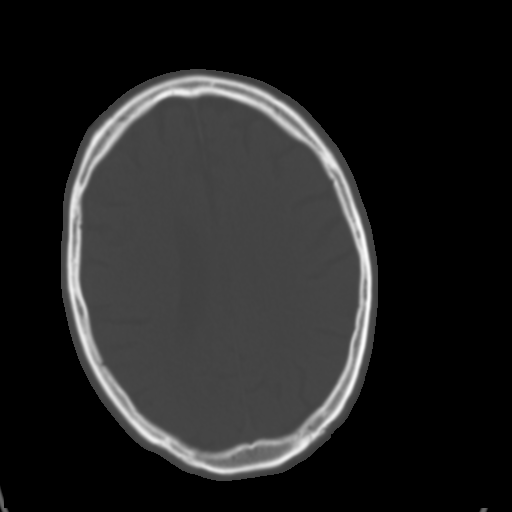
[im 25/34  brain]
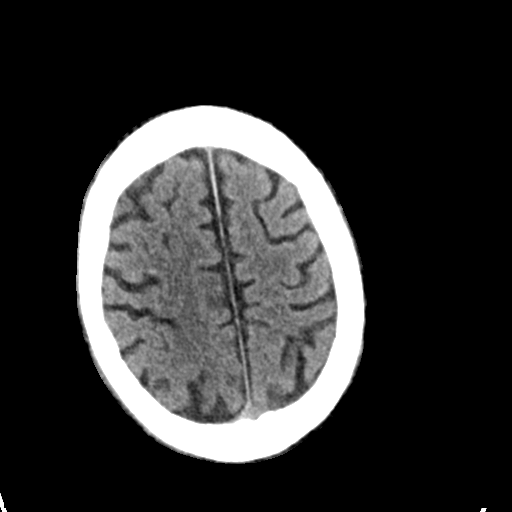
[im 29/34  brain]
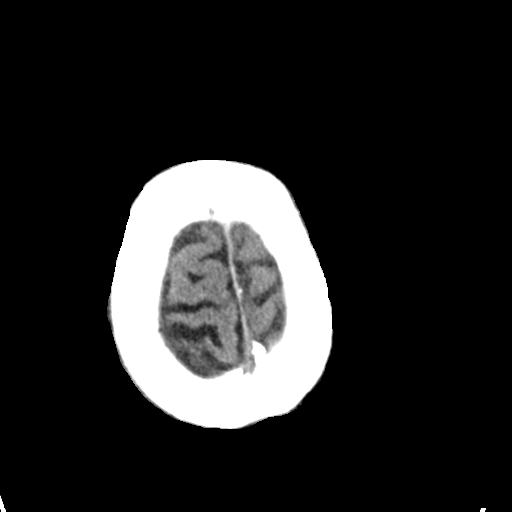

[17 of 47 positions shown; findings below may reference images not displayed]

FINDINGS: The bony calvarium is intact. Increased mucosal thickening is noted
within the ethmoid and sphenoid sinus. Air-fluid levels are noted
within the maxillary antra bilaterally consistent with a more acute
degree of sinusitis. Lacunar infarct is noted within the left basal
ganglia and left thalamus extending into the deep white matter new
from the prior exam. No findings to suggest acute hemorrhage are
noted.
IMPRESSION: New areas of subacute to chronic ischemia on the left.

Progression in the degree of sinusitis as described.

## 2017-11-05 IMAGING — CR DG CHEST 1V PORT
2 series · 2 of 2 positions shown · non-contrast
Comparison: A abdominal radiograph dated 06/16/2016

CLINICAL DATA: 49-year-old male with enteric tube placement.

EXAM:
PORTABLE CHEST 1 VIEW

[AP (1 of 2)]
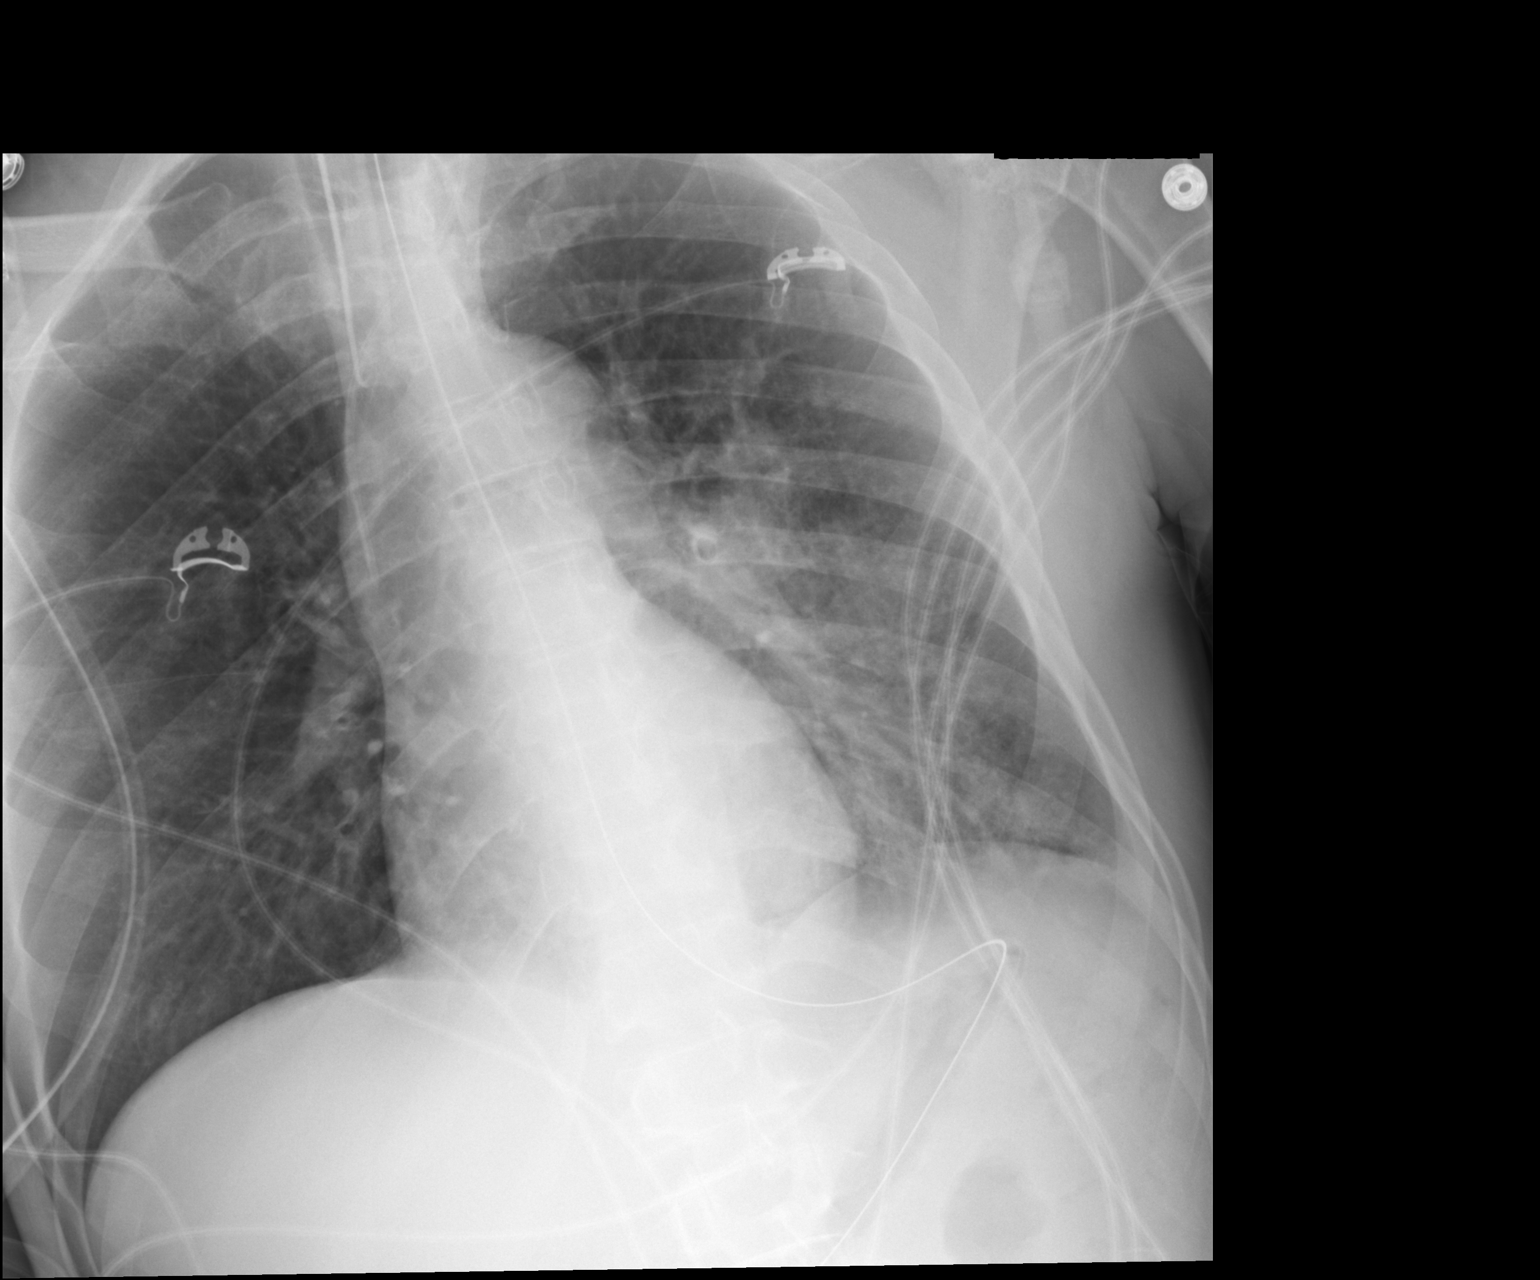

[AP (2 of 2)]
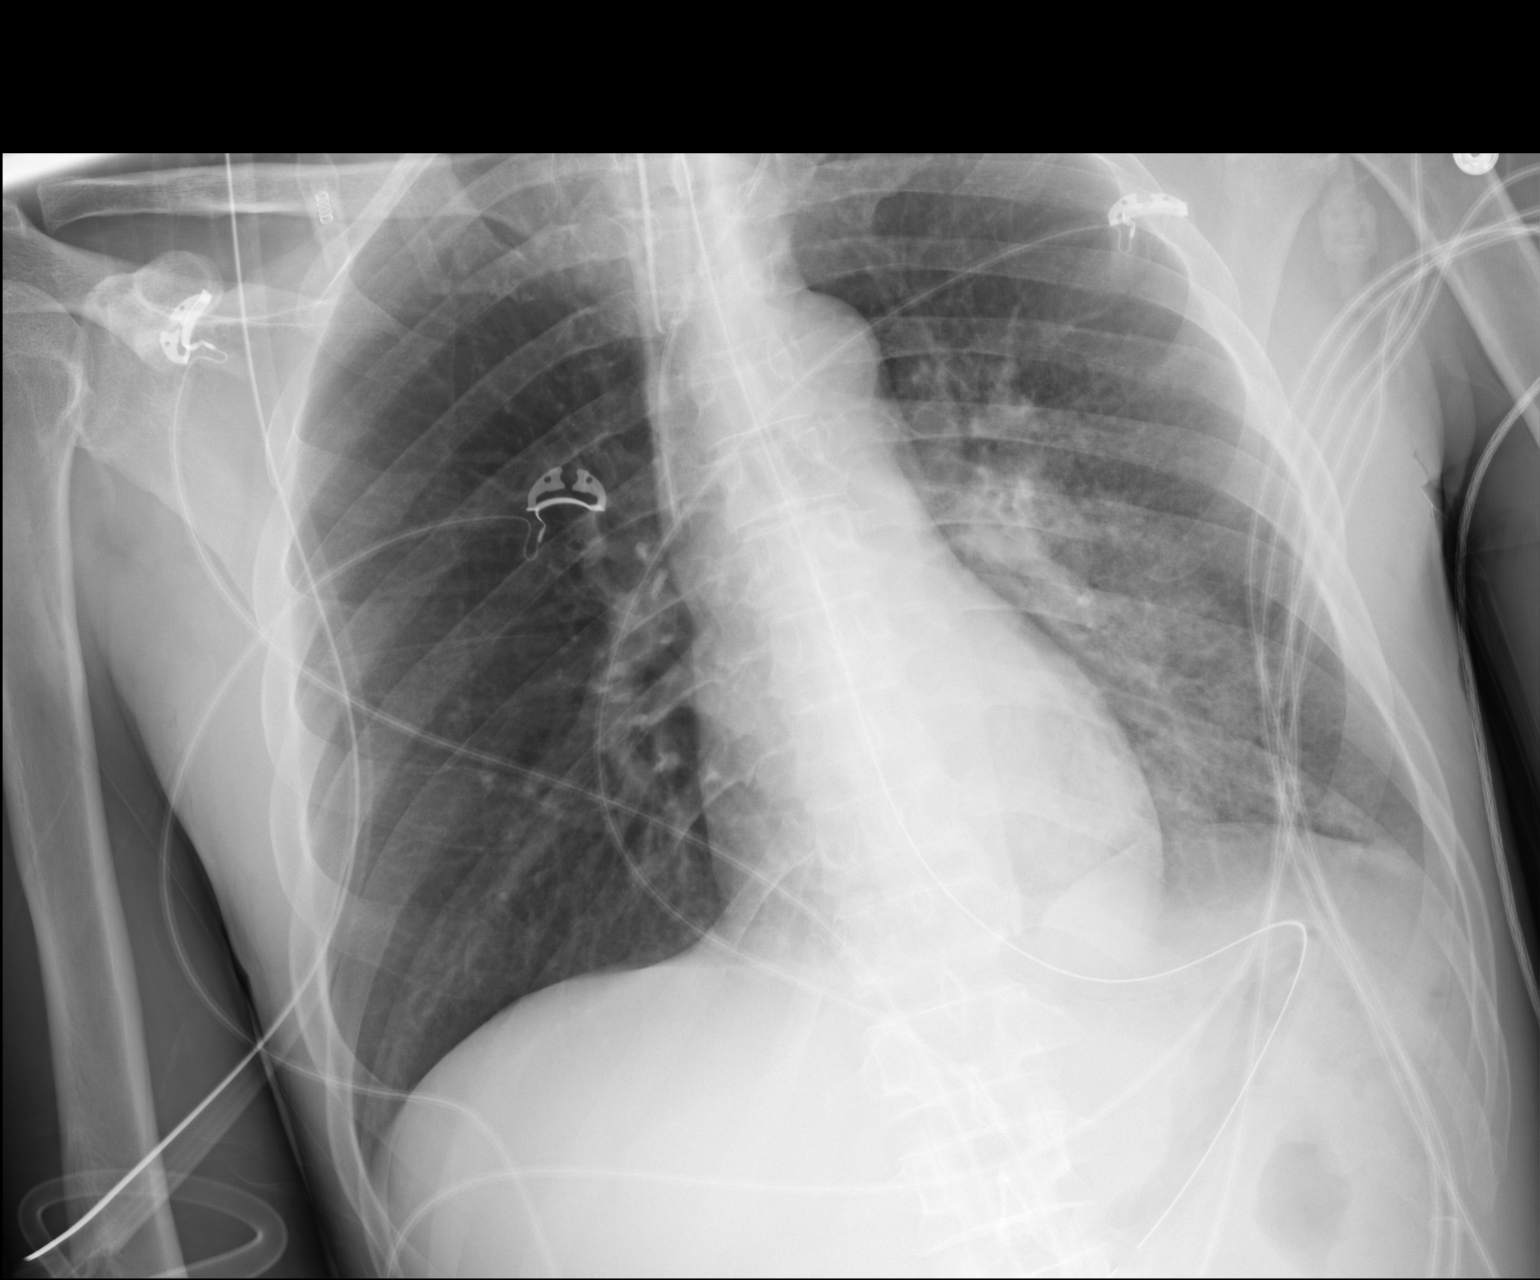

[2 of 2 positions shown; findings below may reference images not displayed]

FINDINGS: An endotracheal tube is noted with tip approximately 3 cm above the
carina. Single portable view of the chest demonstrates patchy area
of hazy airspace opacity in the left mid to lower lung field. There
is no pleural effusion or pneumothorax. The cardiac silhouette is
within normal limits. Left IJ central line with tip over central
SVC.

An enteric tube extends into the epigastric area with tip
superimposed over the right L3 pedicle likely in the distal stomach.
There is no bowel dilatation or evidence of obstruction. No free
air. No radiopaque calculi or foreign object. The soft tissues and
osseous structures appear unremarkable. The previously seen left
femoral central venous catheter has been removed. A temperature
probe is partially visualized over the bladder.
IMPRESSION: Endotracheal tube above the carina.

Patchy area of airspace density in the left mid to lower lung field.
Follow-up recommended.

Enteric tube in the distal stomach.  No bowel obstruction.

## 2017-11-05 IMAGING — CR DG ABD PORTABLE 1V
1 series · 1 of 1 positions shown · non-contrast
Comparison: A abdominal radiograph dated 06/16/2016

CLINICAL DATA: 49-year-old male with enteric tube placement.

EXAM:
PORTABLE CHEST 1 VIEW

[AP]
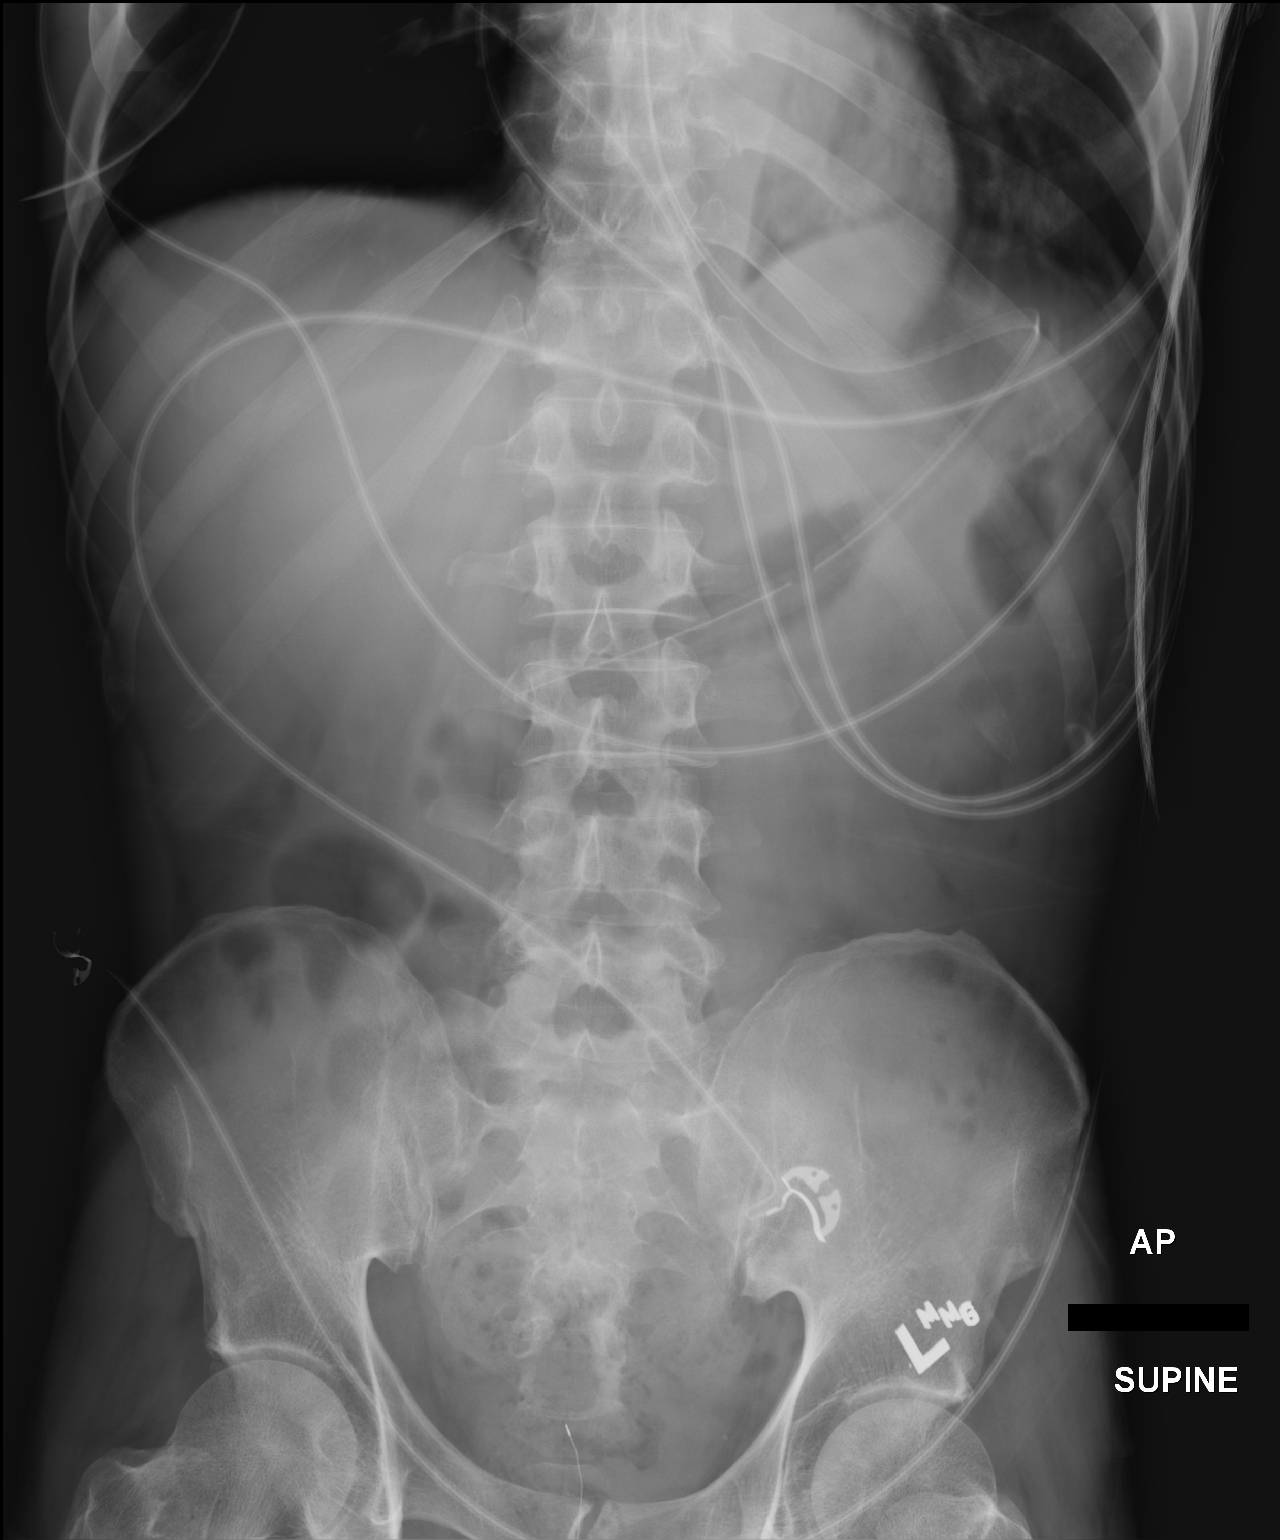

[1 of 1 positions shown; findings below may reference images not displayed]

FINDINGS: An endotracheal tube is noted with tip approximately 3 cm above the
carina. Single portable view of the chest demonstrates patchy area
of hazy airspace opacity in the left mid to lower lung field. There
is no pleural effusion or pneumothorax. The cardiac silhouette is
within normal limits. Left IJ central line with tip over central
SVC.

An enteric tube extends into the epigastric area with tip
superimposed over the right L3 pedicle likely in the distal stomach.
There is no bowel dilatation or evidence of obstruction. No free
air. No radiopaque calculi or foreign object. The soft tissues and
osseous structures appear unremarkable. The previously seen left
femoral central venous catheter has been removed. A temperature
probe is partially visualized over the bladder.
IMPRESSION: Endotracheal tube above the carina.

Patchy area of airspace density in the left mid to lower lung field.
Follow-up recommended.

Enteric tube in the distal stomach.  No bowel obstruction.

## 2017-11-06 IMAGING — CR DG CHEST 1V PORT
1 series · 1 of 1 positions shown · non-contrast
Comparison: 06/17/2016, 06/16/2016

CLINICAL DATA: 49-year-old male with a history of respiratory
failure

EXAM:
PORTABLE CHEST 1 VIEW

[AP]
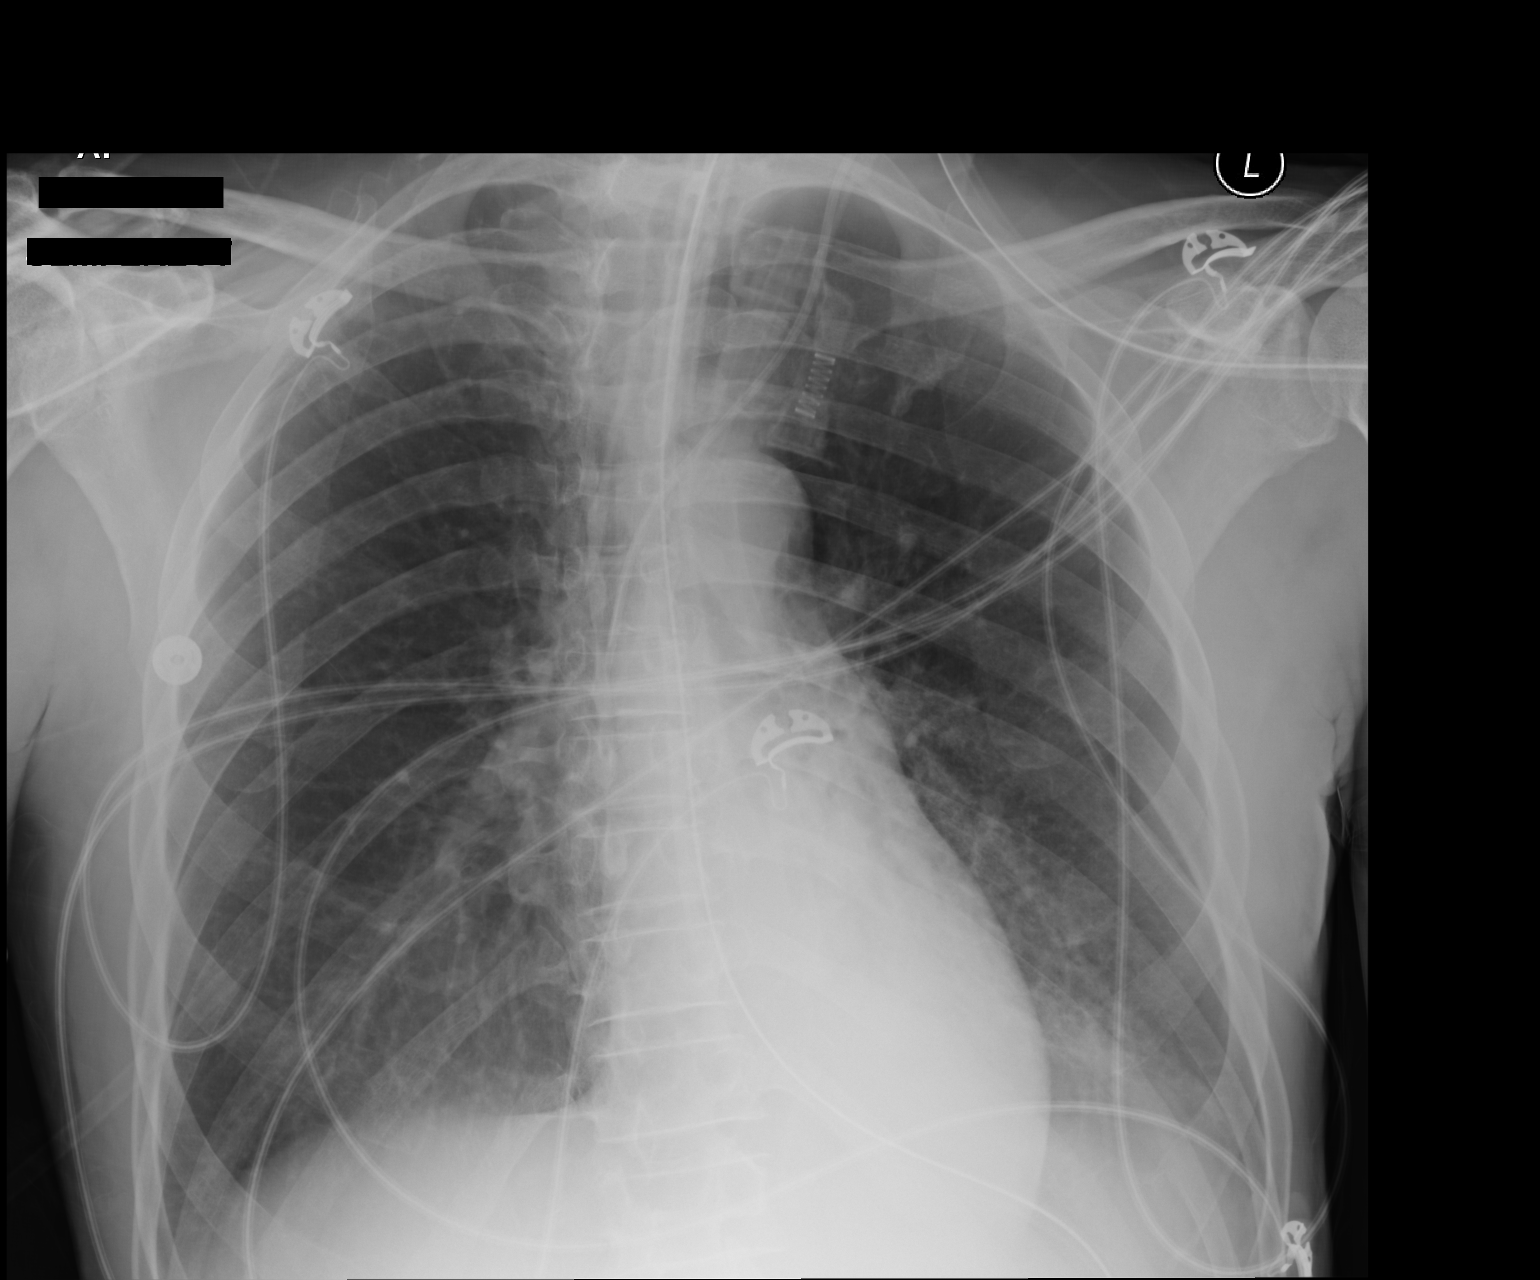

[1 of 1 positions shown; findings below may reference images not displayed]

FINDINGS: Cardiomediastinal silhouette unchanged in size and contour.

Endotracheal tube unchanged, terminating 3.8 cm above the carina.

Unchanged left IJ approach central venous catheter, appearing to
terminate superior vena cava.

Gastric tube projects over the mediastinum, terminating out of the
field of view.

No visualized pneumothorax.

Improved aeration in the left mid lung, with persistent opacity in
the retrocardiac region. Blunting of left costophrenic angle.
IMPRESSION: Improved aeration of the left lung, with persistent left basilar
opacity, potentially consolidation, edema, and/ or atelectasis.
Small left pleural effusion not excluded.

Unchanged position of endotracheal tube, gastric tube, left IJ
central venous catheter.

## 2017-11-07 IMAGING — CR DG CHEST 1V PORT
1 series · 1 of 1 positions shown · non-contrast
Comparison: 06/18/2016

CLINICAL DATA: Respiratory failure

EXAM:
PORTABLE CHEST 1 VIEW

[AP]
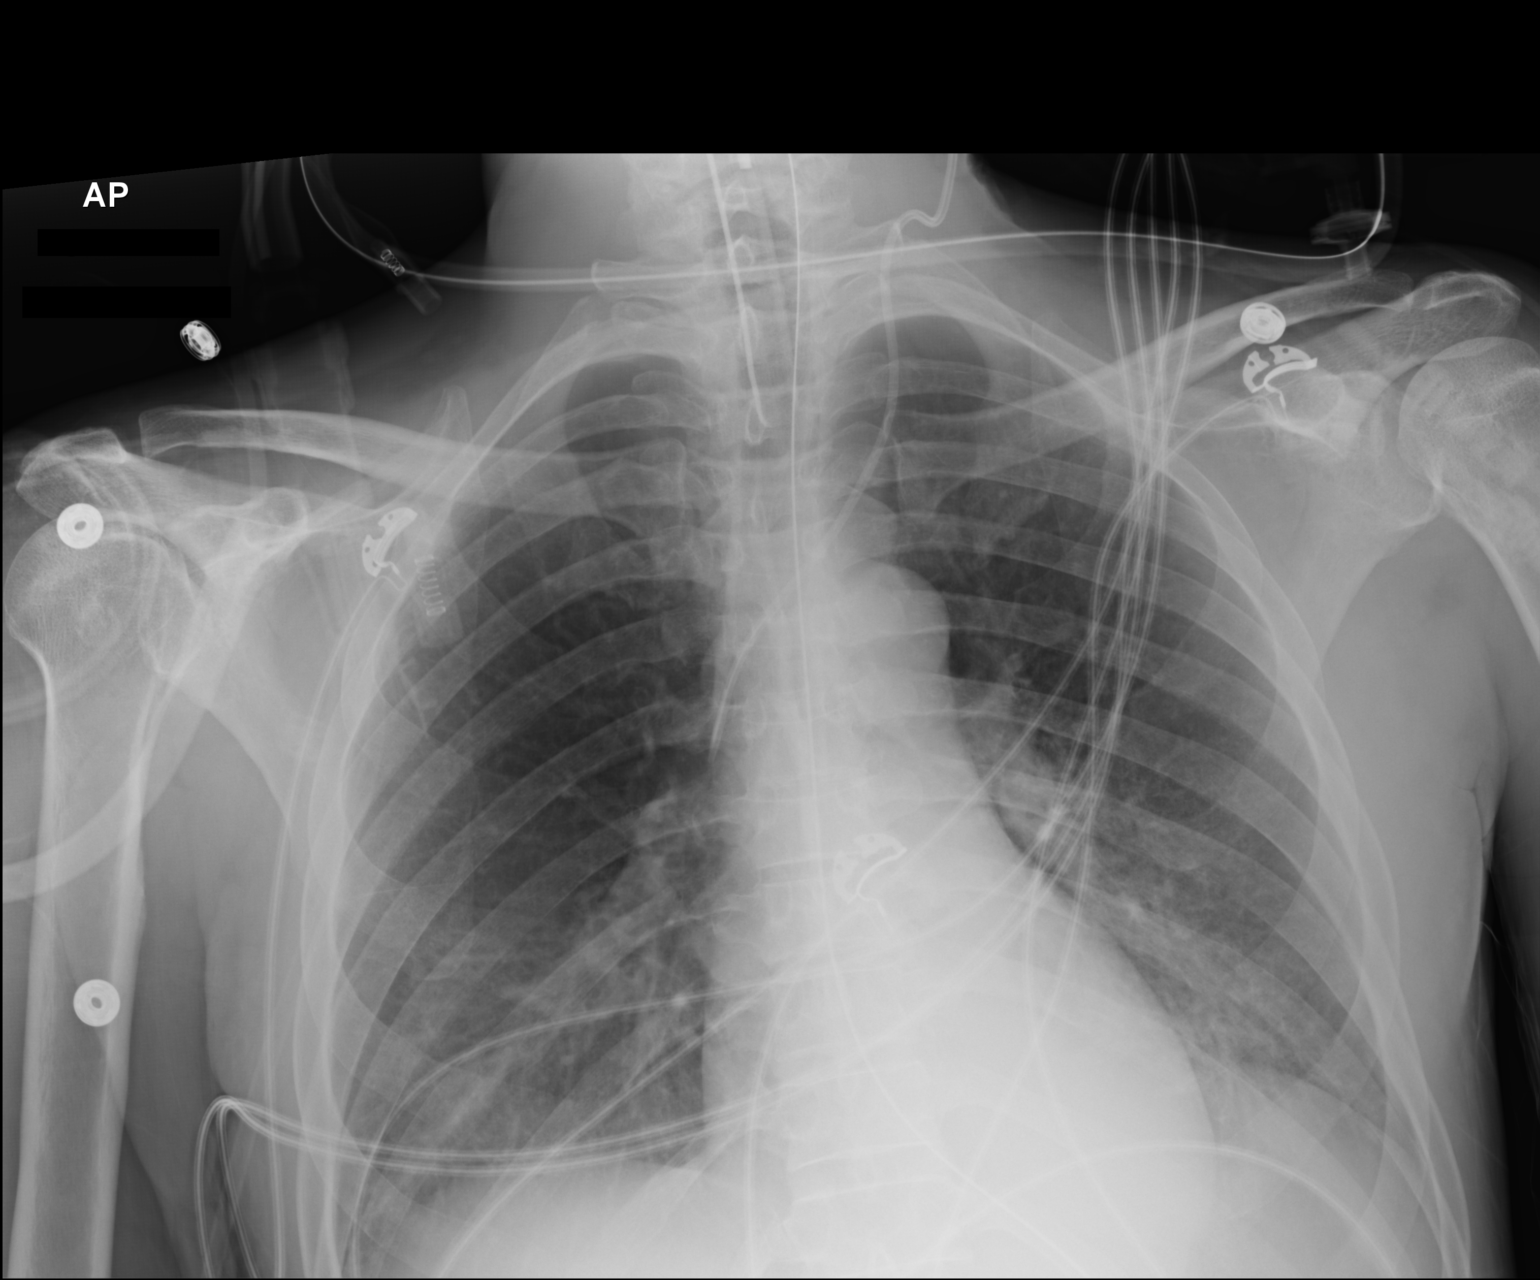

[1 of 1 positions shown; findings below may reference images not displayed]

FINDINGS: ET tube tip is above the carina. There is a left IJ catheter with
tip in the projection of the SVC. Enteric tube tip is below the
hemidiaphragm. Persistent opacity within the left lung base. Right
lung is clear.
IMPRESSION: 1. Stable position of the support apparatus.
2. No change in left base opacity.

## 2017-11-09 IMAGING — MR MR HEAD WO/W CM
9 of 12 series · 32 of 48 positions shown · IV contrast (multihance)
Comparison: Comparison made with prior CT from 06/17/2016 as well
as previous MRI from 03/24/2016.

CLINICAL DATA: Initial evaluation for recurrent altered mental
status, seizures, history of strep pneumo meningitis. Question new
areas of and within the left basal ganglia.

EXAM:
MRI HEAD WITHOUT AND WITH CONTRAST
TECHNIQUE: Multiplanar, multiecho pulse sequences of the brain and surrounding
structures were obtained without and with intravenous contrast.
CONTRAST:  14mL MULTIHANCE GADOBENATE DIMEGLUMINE 529 MG/ML IV SOLN

[Series 3: T1 · sagittal · 5.0mm · 0.47mm/px · 2 of 23 slices shown]
[im 1/23]
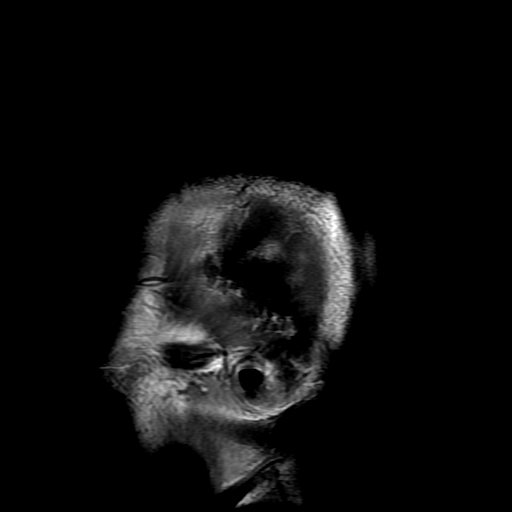
[im 12/23]
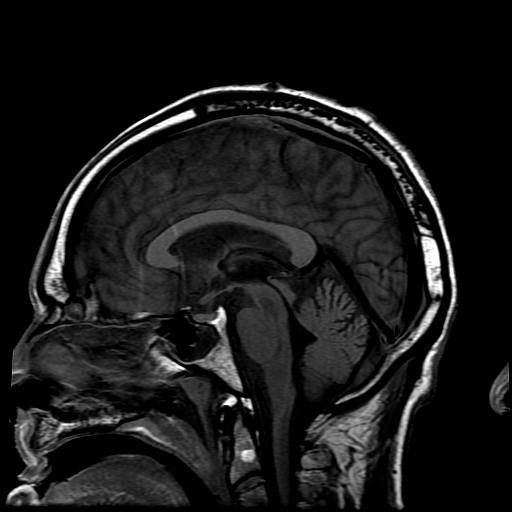

[Series 4: DWI · axial · 3.0mm · 1.09mm/px · z∈[-56,+85]mm · 8 of 98 slices shown (1 of 4)]
[im 1/98]
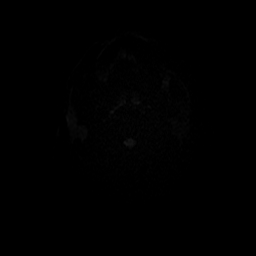
[im 14/98]
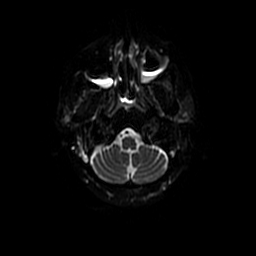
[im 28/98]
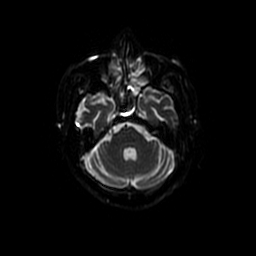
[im 42/98]
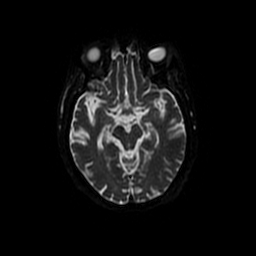
[im 56/98]
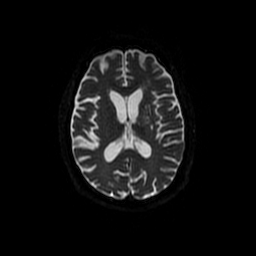
[im 70/98]
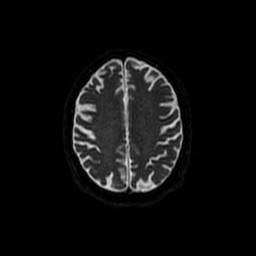
[im 84/98]
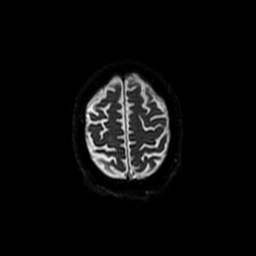
[im 98/98]
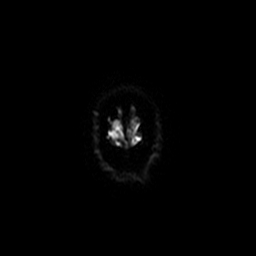

[Series 5: T2 · axial · 5.0mm · 0.43mm/px · z∈[-61,+80]mm · 2 of 25 slices shown (1 of 2)]
[im 1/25]
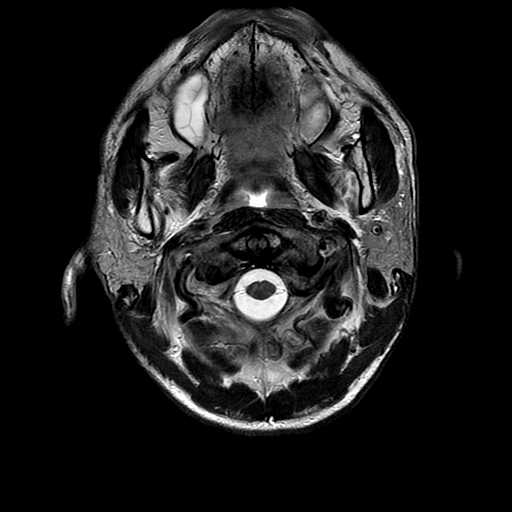
[im 25/25]
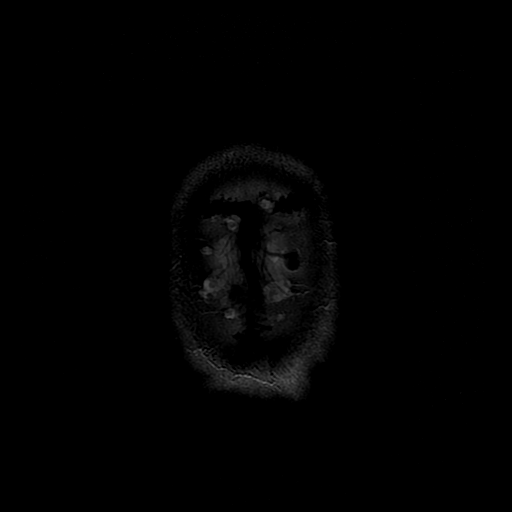

[Series 6: FLAIR · axial · 5.0mm · 0.43mm/px · z∈[-61,+80]mm · 2 of 25 slices shown]
[im 1/25]
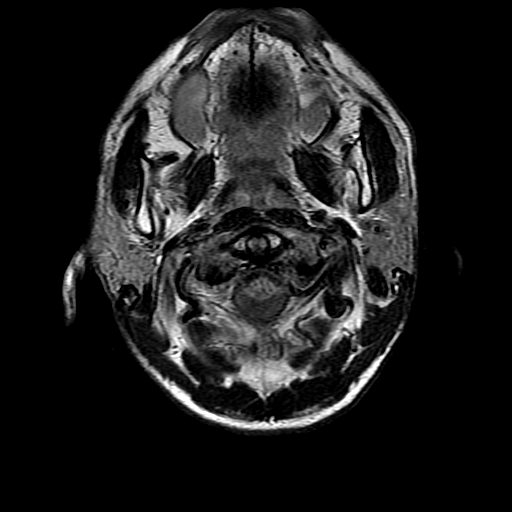
[im 25/25]
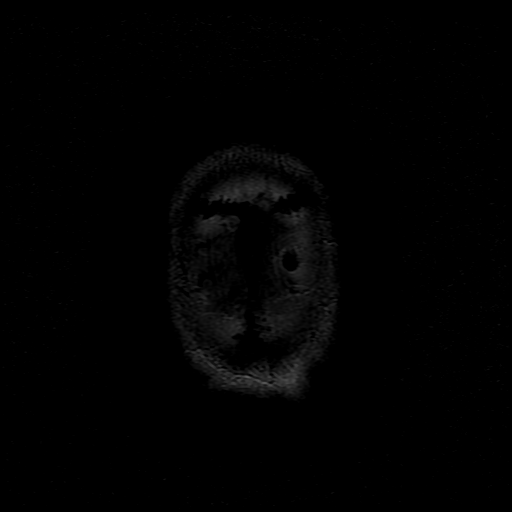

[Series 7: DWI · coronal · 5.0mm · 1.09mm/px · 6 of 72 slices shown (2 of 4)]
[im 1/72]
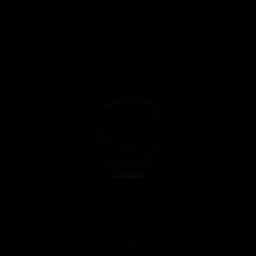
[im 15/72]
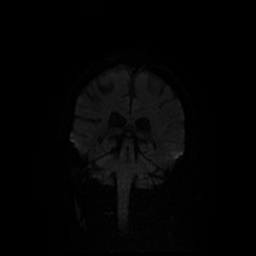
[im 29/72]
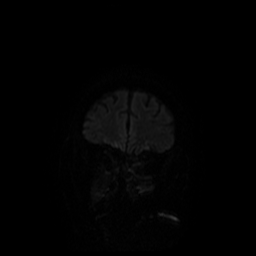
[im 43/72]
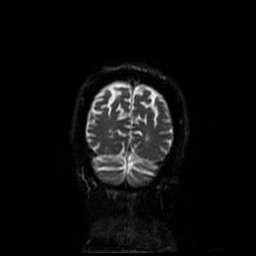
[im 57/72]
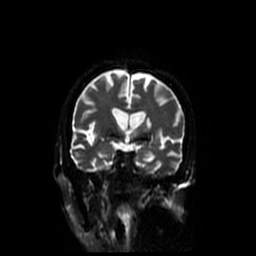
[im 72/72]
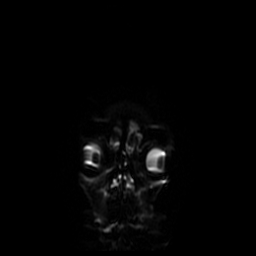

[Series 12: T1 post-contrast · coronal · 5.0mm · 0.39mm/px · 2 of 28 slices shown]
[im 1/28]
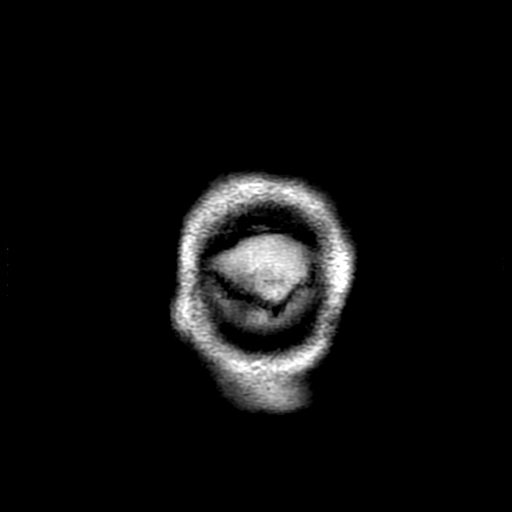
[im 28/28]
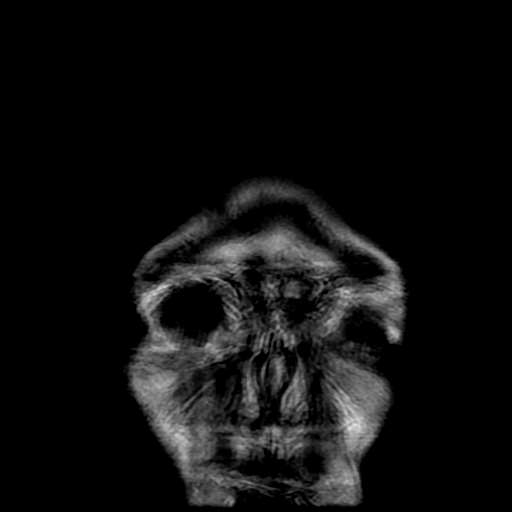

[Series 14: T2 · coronal · 5.0mm · 0.43mm/px · 3 of 31 slices shown (2 of 2)]
[im 1/31]
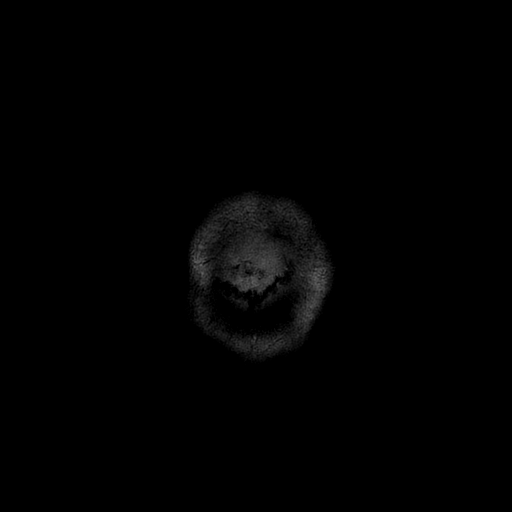
[im 16/31]
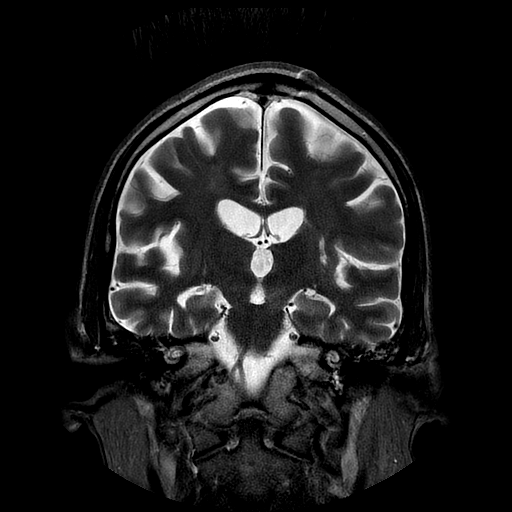
[im 31/31]
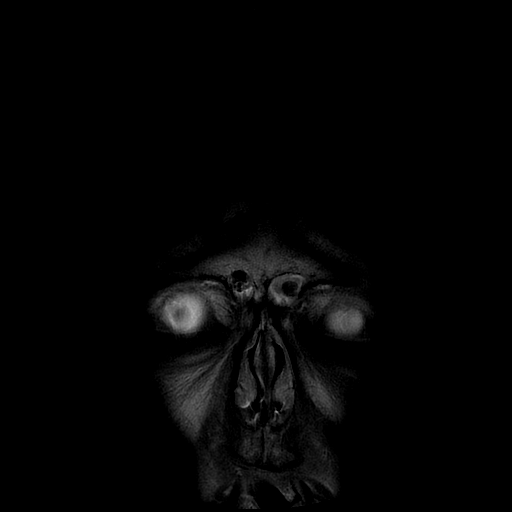

[Series 400: DWI · axial · 3.0mm · 1.09mm/px · z∈[-56,+85]mm · 4 of 49 slices shown (3 of 4)]
[im 1/49]
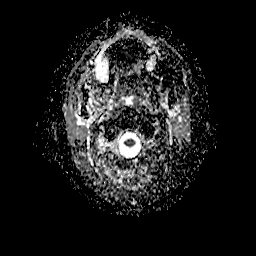
[im 17/49]
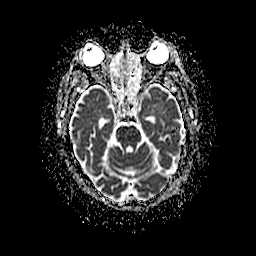
[im 33/49]
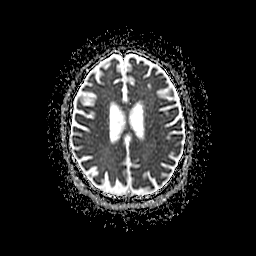
[im 49/49]
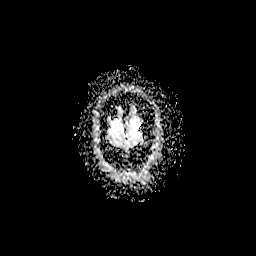

[Series 700: DWI · coronal · 5.0mm · 1.09mm/px · 3 of 36 slices shown (4 of 4)]
[im 1/36]
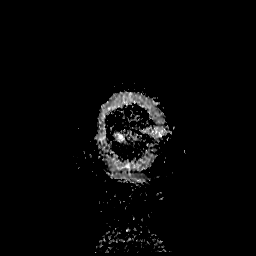
[im 18/36]
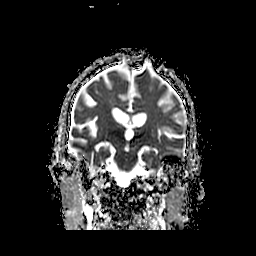
[im 36/36]
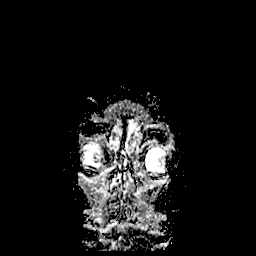

[32 of 48 positions shown; findings below may reference images not displayed]

FINDINGS: Diffuse prominence of the CSF containing spaces compatible with
generalized cerebral atrophy.

There has been normal interval expected evolution of previously
identified acute ischemic infarct involving the left corona
radiata/basal ganglia, which now demonstrates progressive
encephalomalacia without significant diffusion abnormality. Question
minimal faint enhancement within this region extending towards the
left cerebral peduncle, likely related to the late subacute ischemia
(series 11, image 28). Additional remote infarctions involving the
bilateral basal ganglia again seen. Chronic blood products again
noted within the left external capsule. No new areas of restricted
diffusion to suggest new ischemia or injury.

Similar FLAIR signal abnormality at the right parietal cortex.
Associated subtle laminar necrosis within this region (series 3,
image 6). No significant enhancement now seen within this region.
Additional focus of cortical flares abnormality within the right
frontal lobe slightly more prominent as compared to previous exam,
although this appears to be chronic in nature. No appreciable
enhancement within these areas.

No other abnormal enhancement elsewhere within the brain. Major
intracranial vascular flow voids are preserved. No mass lesion, mass
effect, or midline shift. Layering susceptibility artifact within
the stent was of both lateral ventricles, likely debris/chronic
hemorrhage related to prior meningitis, similar to prior. No
hydrocephalus.

Craniocervical junction within normal limits. Visualized upper
cervical spine unremarkable.

Pituitary gland normal. No acute abnormality about the globes and
orbits.

Extensive opacity throughout the paranasal sinuses with fluid levels
within the sphenoid and maxillary sinuses. Bilateral mastoid
effusions. Fluid within the nasopharynx. Patient likely intubated.

Bone marrow signal intensity within normal limits. No scalp soft
tissue abnormality.
IMPRESSION: 1. Normal expected interval evolution of previously identified
infarct involving the left basal ganglia/corona radiata.
Additionally, previously seen signal abnormalities within the
bilateral basal ganglia have also largely resolved relative to
previous MRI from 03/24/2016. Similarly, changes at the right
parietal cortex have also evolved. No new areas of ischemia or
injury identified.
2. Persistent layering debris/hemorrhage within the lateral
ventricles, likely related to recent history of meningitis. No
hydrocephalus.

## 2017-11-10 IMAGING — CR DG CHEST 1V PORT
1 series · 1 of 1 positions shown · non-contrast
Comparison: Portable chest x-ray June 19, 2016

CLINICAL DATA: Respiratory failure, acute encephalopathy,
cardiogenic shock, acute and chronic CHF, current smoker.

EXAM:
PORTABLE CHEST 1 VIEW

[AP]
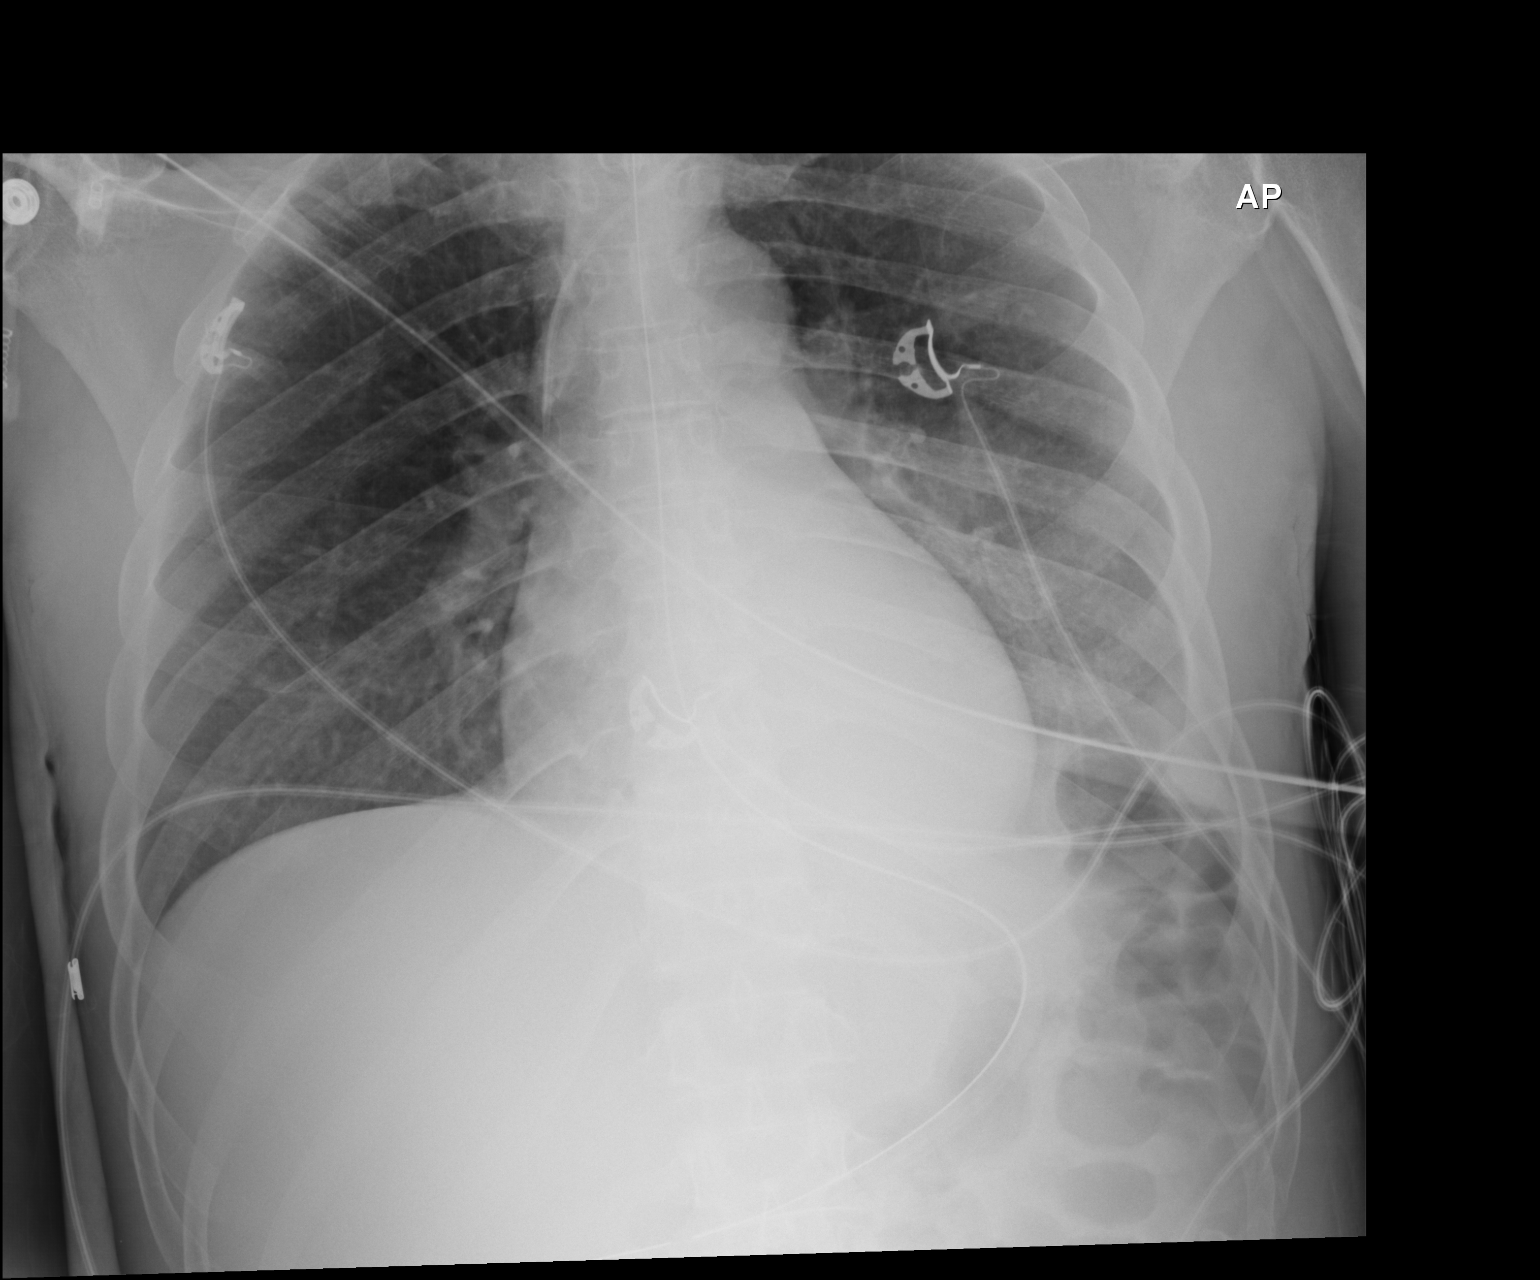

[1 of 1 positions shown; findings below may reference images not displayed]

FINDINGS: The right lung is adequately inflated and clear. On the left there
is persistent basilar atelectasis or pneumonia with small pleural
effusion. The heart is normal in size. The pulmonary vascularity is
not engorged. The endotracheal tube tip lies 5.3 cm above the
carina. The esophagogastric tube tip projects below the inferior
margin of the image. The left internal jugular venous catheter tip
projects over the proximal third of the SVC.
IMPRESSION: Persistent left lower lobe atelectasis or pneumonia with trace
pleural effusion. No CHF. The support tubes are in stable position.

## 2017-11-13 IMAGING — CR DG CHEST 1V PORT
1 series · 1 of 1 positions shown · non-contrast
Comparison: 06/22/2016.

CLINICAL DATA: Pneumonia.

EXAM:
PORTABLE CHEST 1 VIEW

[AP]
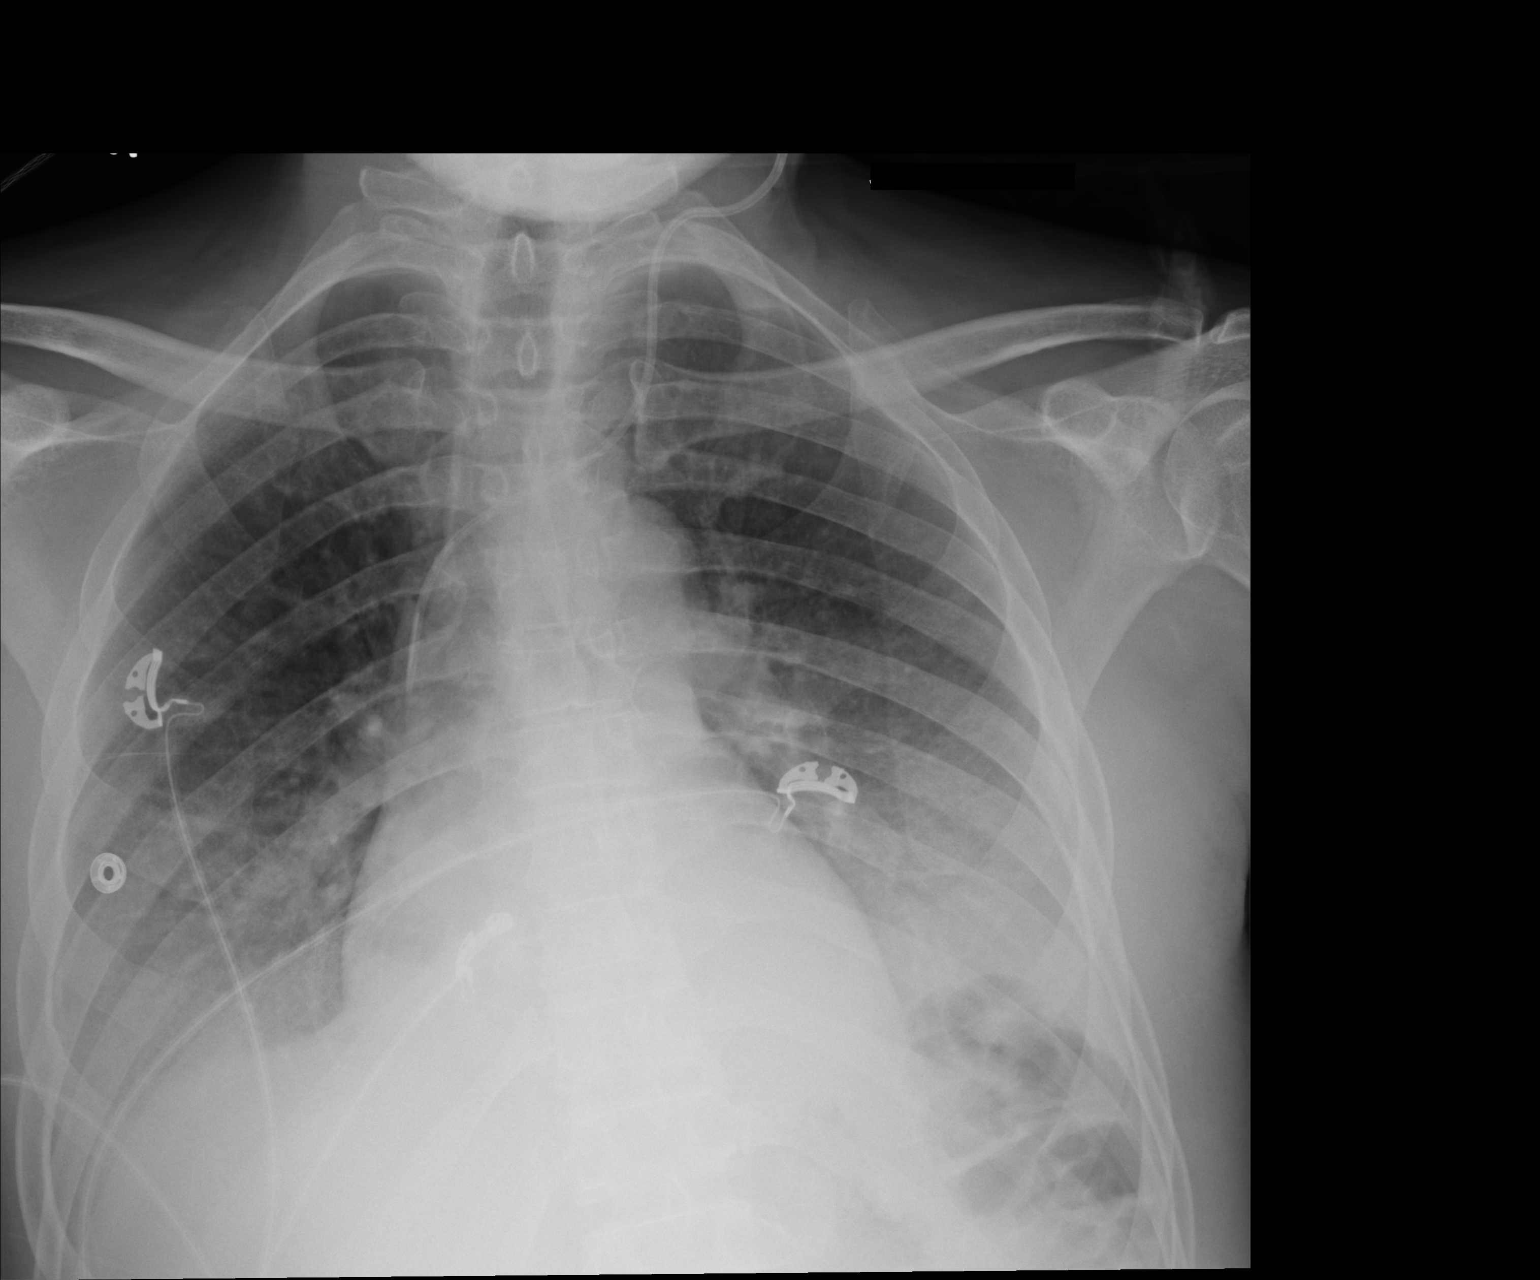

[1 of 1 positions shown; findings below may reference images not displayed]

FINDINGS: Interval extubation. Left IJ central line tip projects over the SVC.
Heart size normal. Left lower lobe collapse/ consolidation. Hazy
opacification over the lower hemithoraces bilaterally, new on the
right.
IMPRESSION: 1. Left lower lobe collapse/consolidation.
2. Hazy opacification over both lower hemithoraces may be due to
layering pleural fluid and/or true airspace opacification.

## 2017-11-17 IMAGING — RF DG SWALLOWING FUNCTION - NRPT [facility]
1 series · 18 of 24 positions shown · non-contrast
Comparison: none

[Series 1: run · 24 acquisitions, 18 frames shown]
[im 1/24]
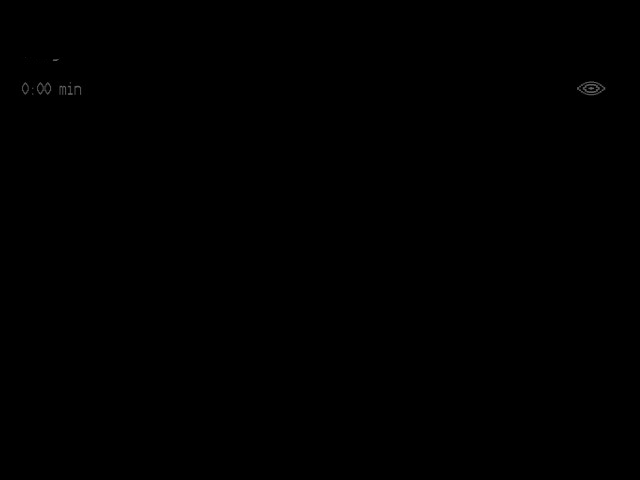
[im 3/24]
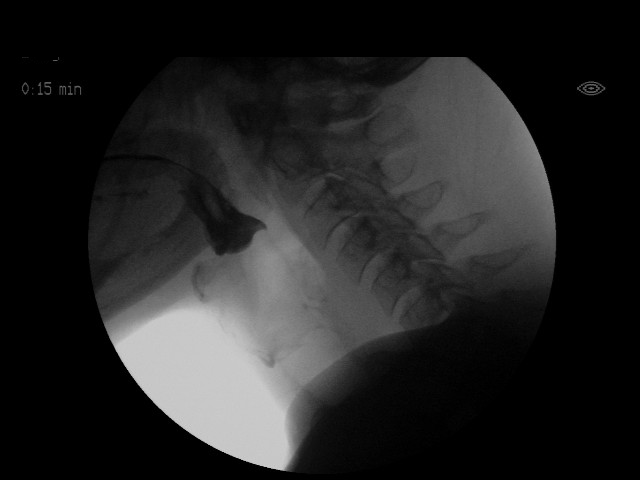
[im 4/24]
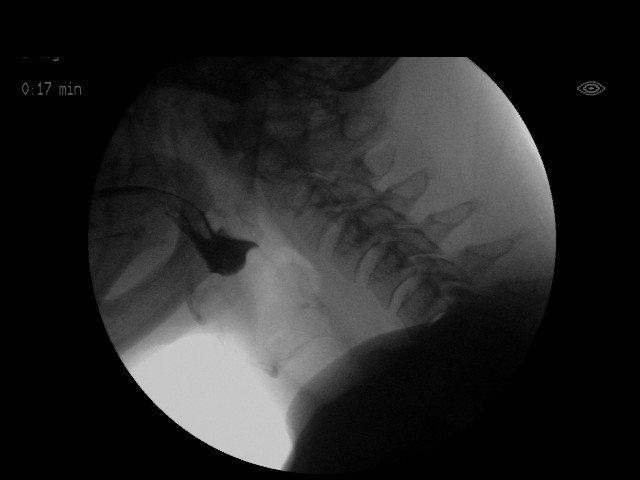
[im 5/24]
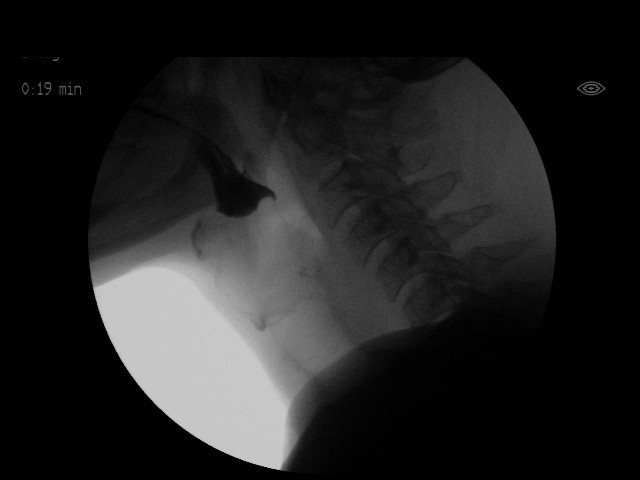
[im 7/24]
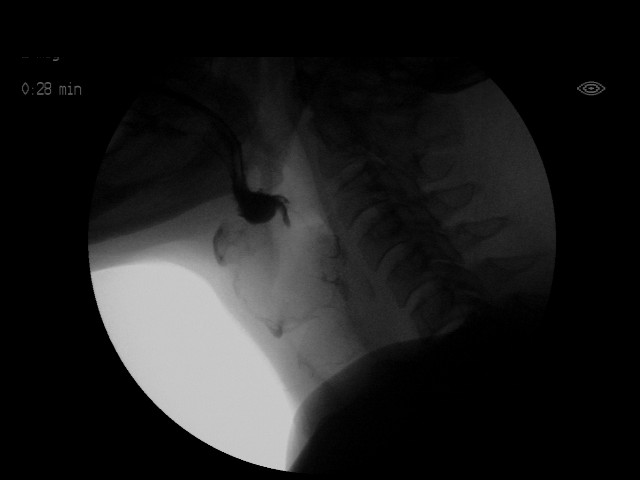
[im 8/24]
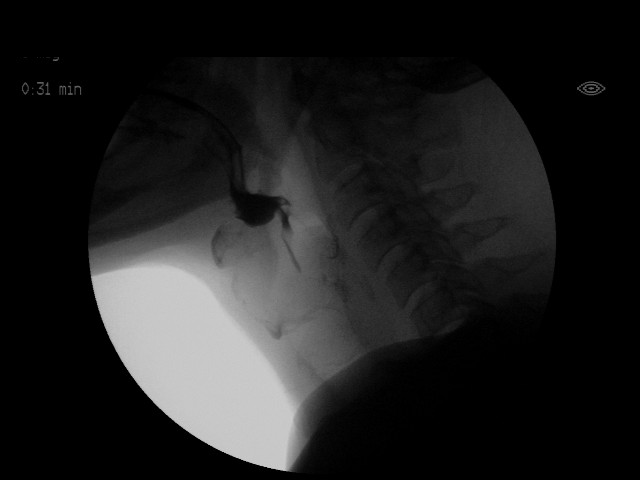
[im 9/24]
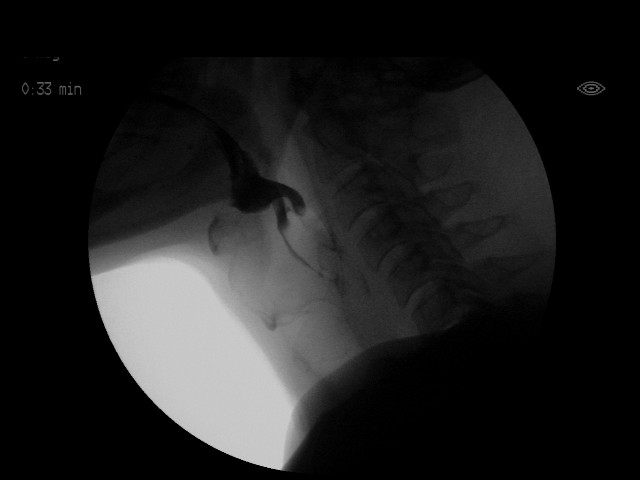
[im 11/24]
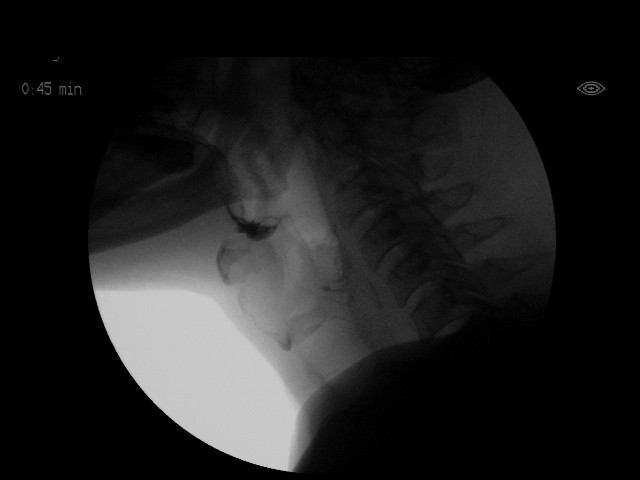
[im 12/24]
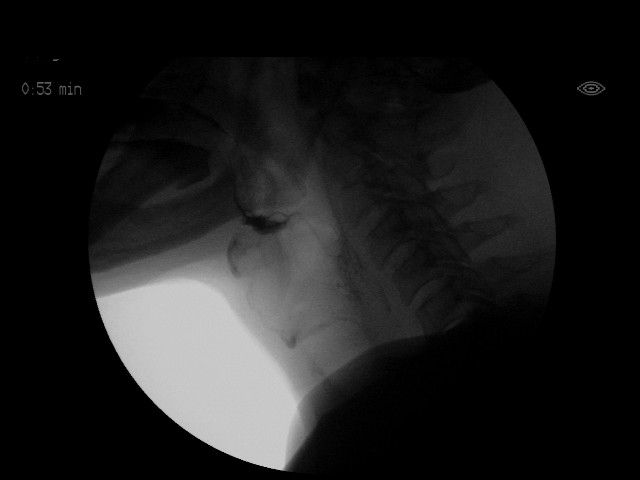
[im 13/24]
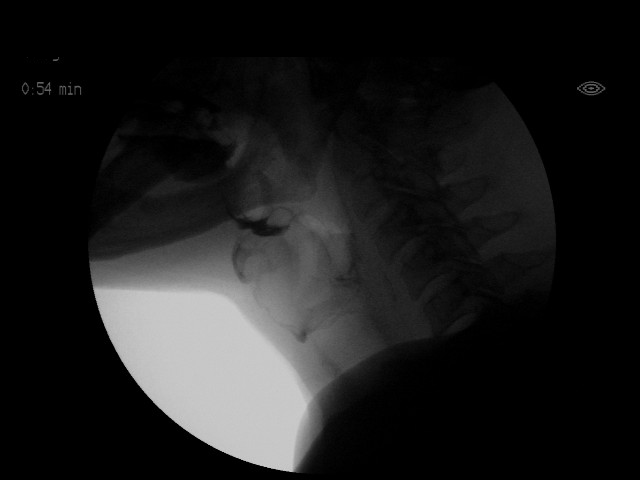
[im 15/24]
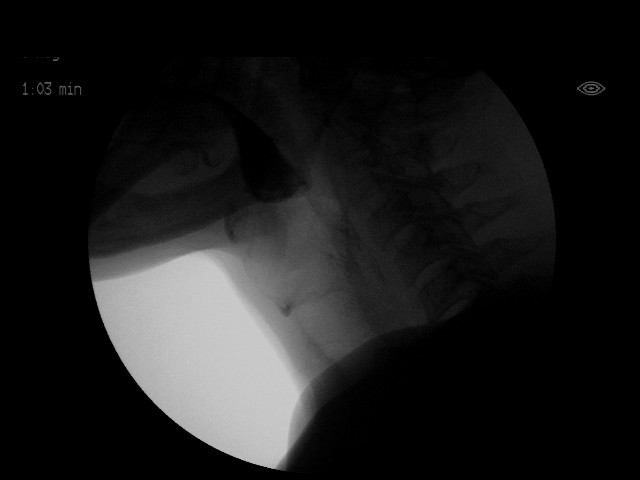
[im 16/24]
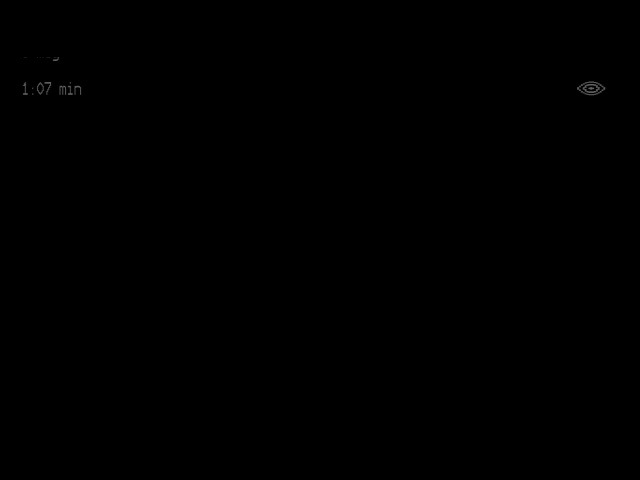
[im 17/24]
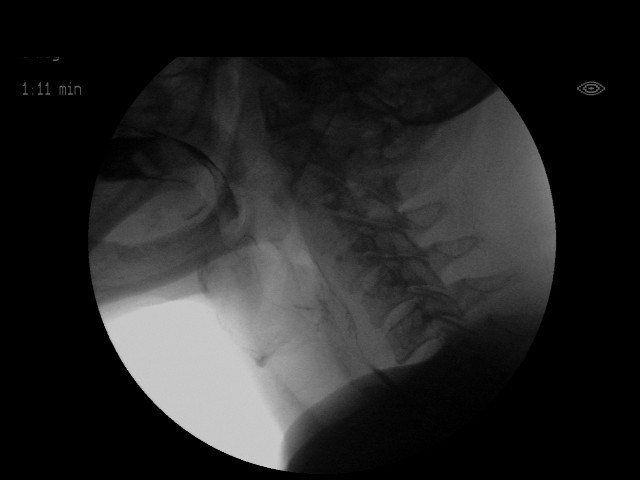
[im 19/24]
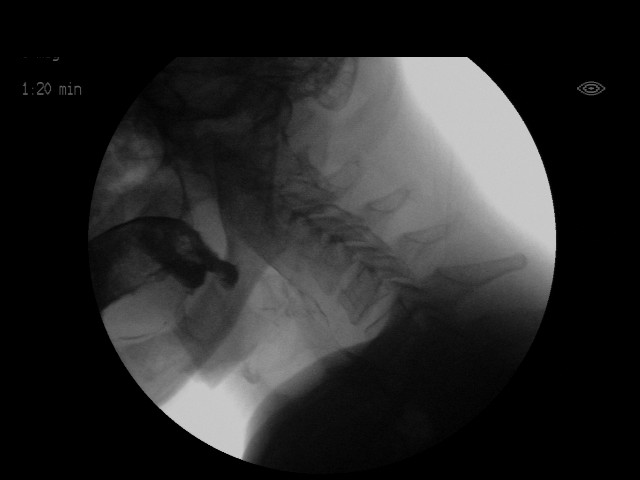
[im 20/24]
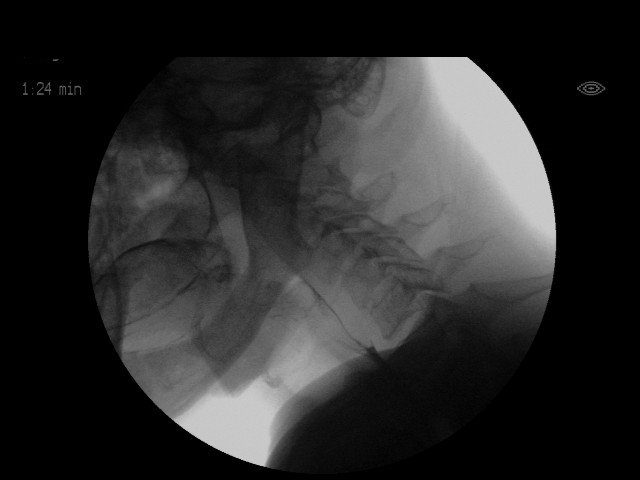
[im 21/24]
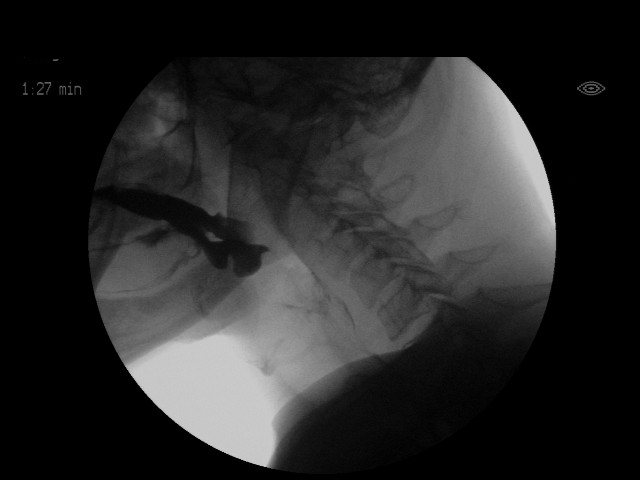
[im 23/24]
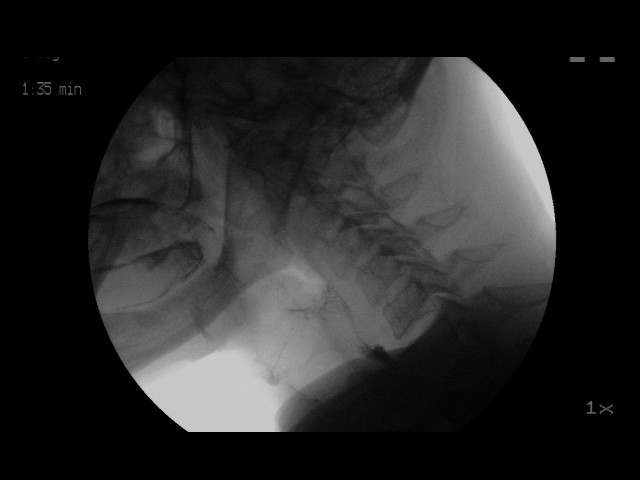
[im 24/24]
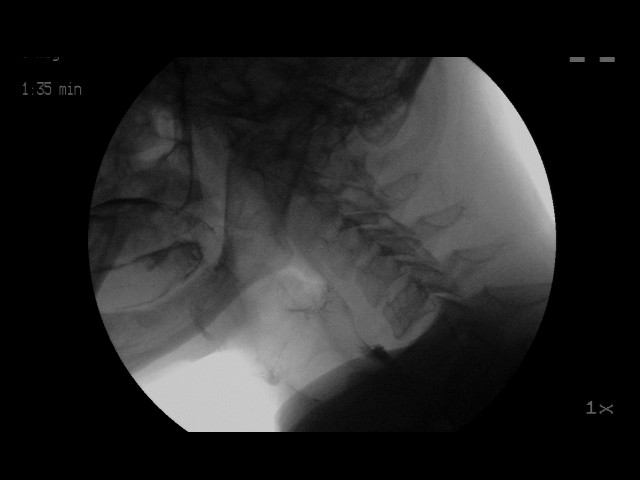

[18 of 24 positions shown; findings below may reference images not displayed]

FLUOROSCOPY FOR SWALLOWING FUNCTION STUDY:
Fluoroscopy was provided for swallowing function study, which was administered by a speech pathologist.  Final results and recommendations from this study are contained within the speech pathology report.

## 2017-11-17 IMAGING — CT CT HEAD W/O CM
4 of 5 series · 15 of 47 positions shown, 17 images · non-contrast
Comparison: MRI brain dated 06/21/2016

CLINICAL DATA: Altered mental status

EXAM:
CT HEAD WITHOUT CONTRAST
TECHNIQUE: Contiguous axial images were obtained from the base of the skull
through the vertex without intravenous contrast.

[Series 2: head without · axial · non-contrast · 0.43mm/px · z∈[-143,-43]mm · 5 of 30 slices shown, 7 images]
[im 5/30  brain]
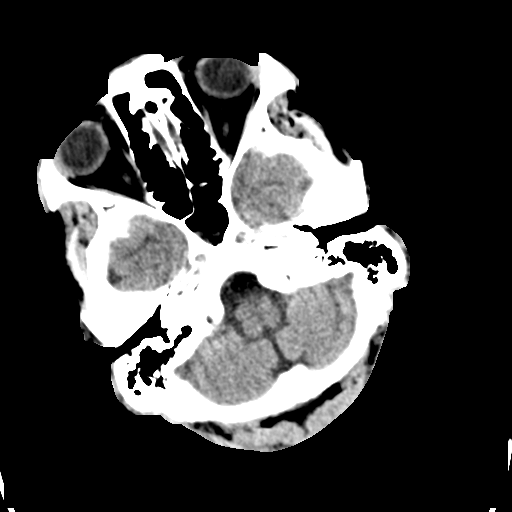
[im 5/30  bone]
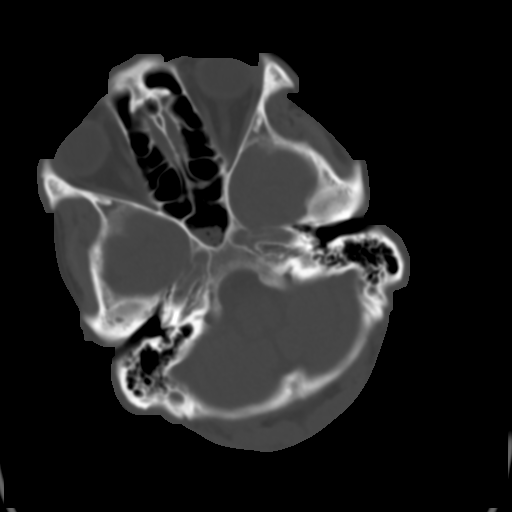
[im 10/30  brain]
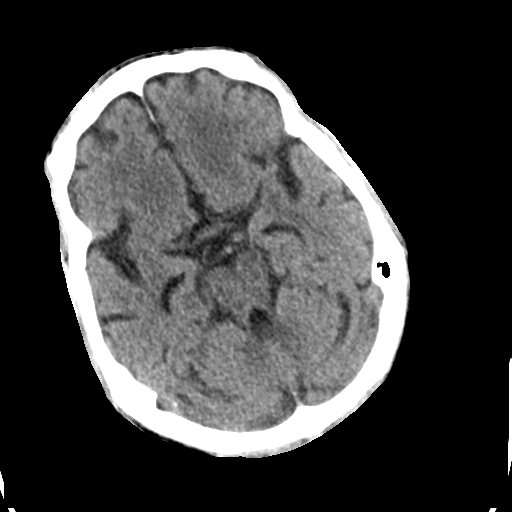
[im 15/30  brain]
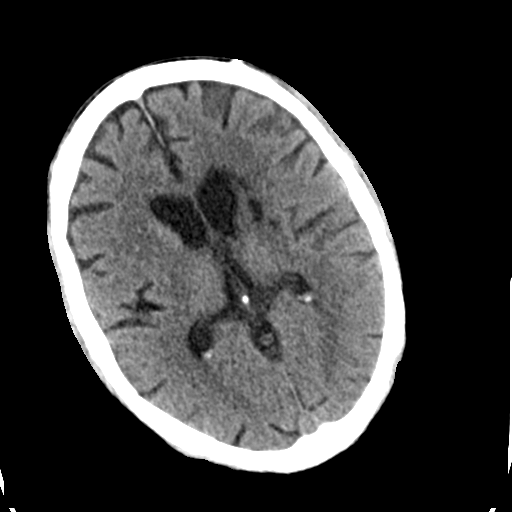
[im 20/30  brain]
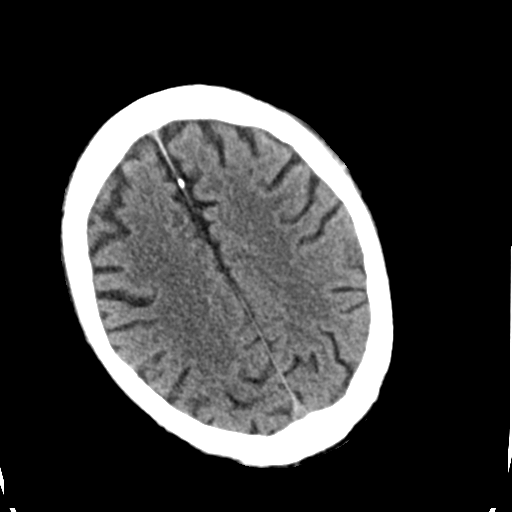
[im 25/30  brain]
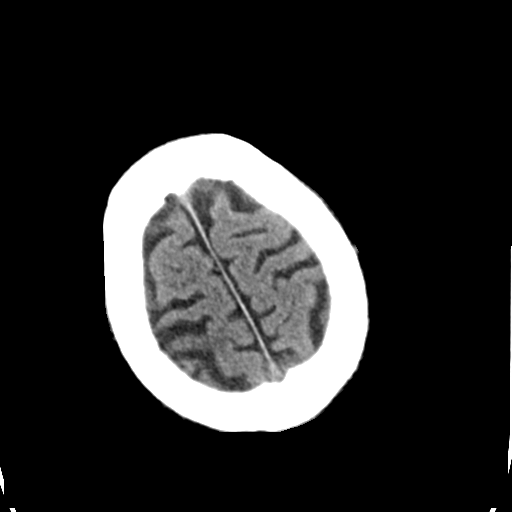
[im 25/30  bone]
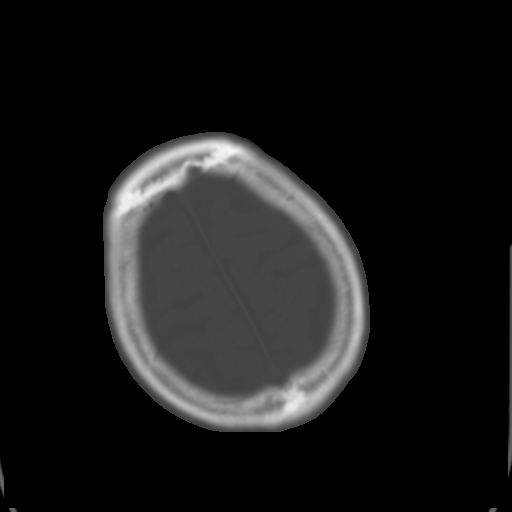

[Series 4: head without cor · coronal · non-contrast · 0.30mm/px · 3 of 61 slices shown]
[im 21/61  brain]
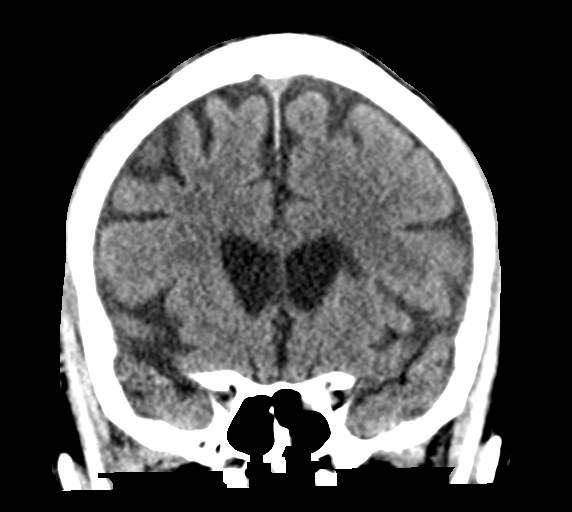
[im 27/61  brain]
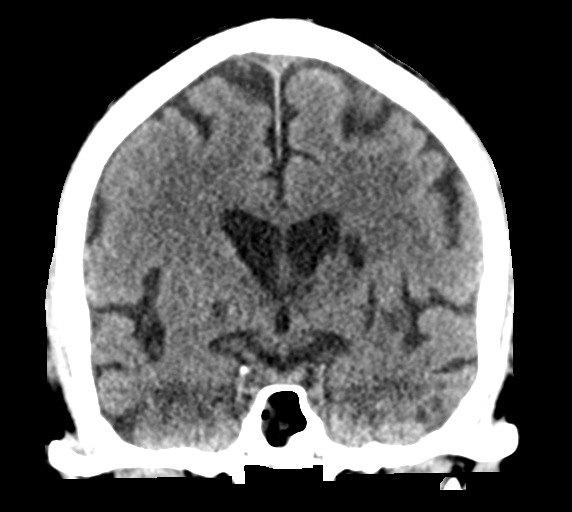
[im 34/61  brain]
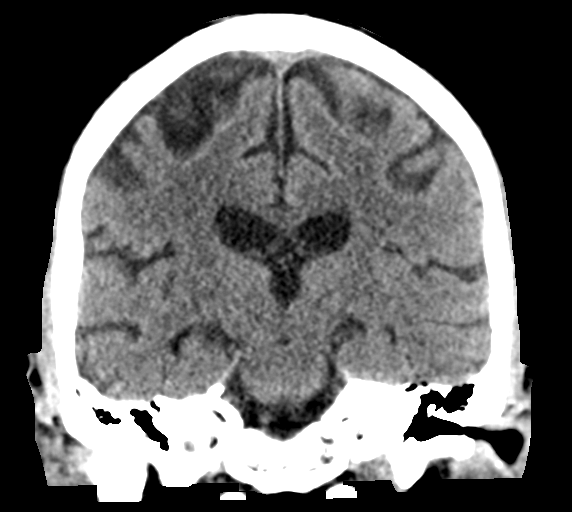

[Series 5: head without sag · sagittal · non-contrast · 0.31mm/px · 3 of 49 slices shown]
[im 17/49  brain]
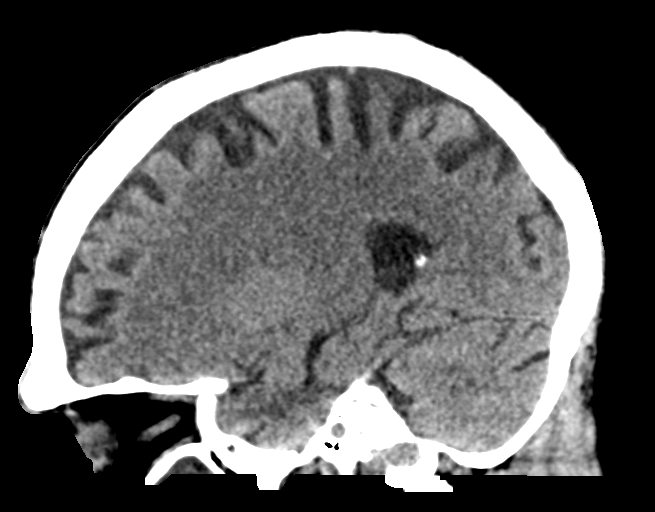
[im 25/49  brain]
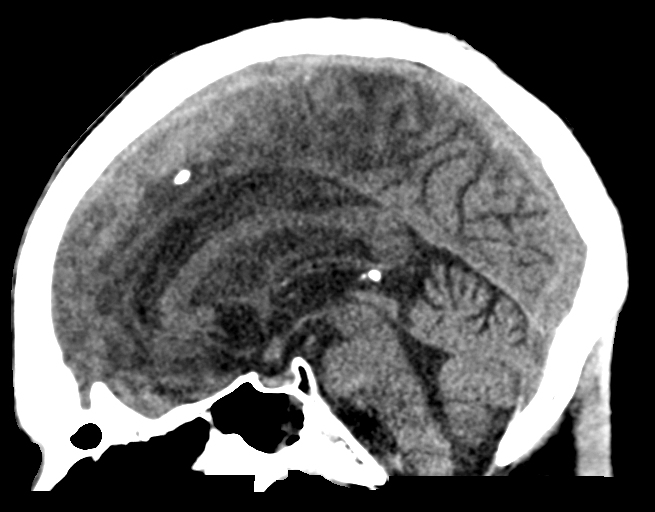
[im 33/49  brain]
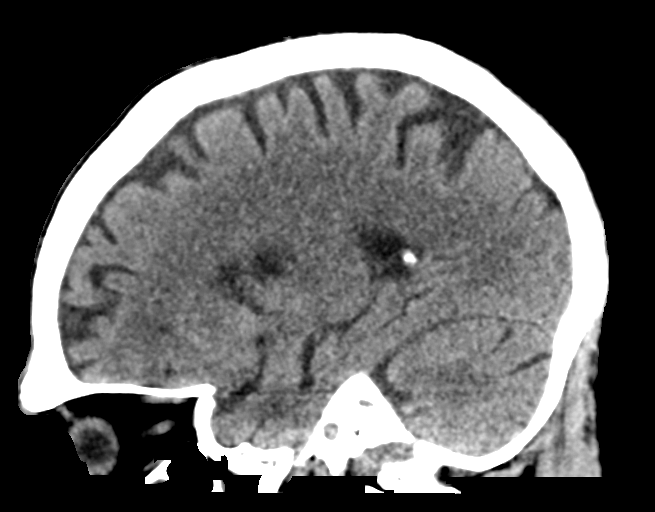

[Series 6: head without ax · axial · non-contrast · 0.35mm/px · z∈[-141,-61]mm · 4 of 28 slices shown]
[im 6/28  brain]
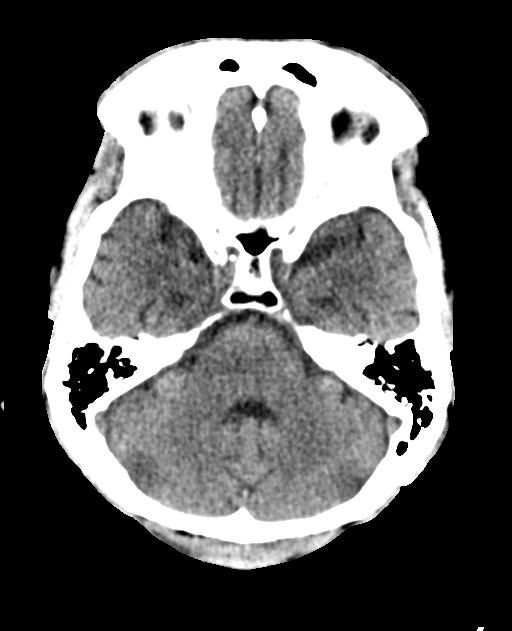
[im 11/28  brain]
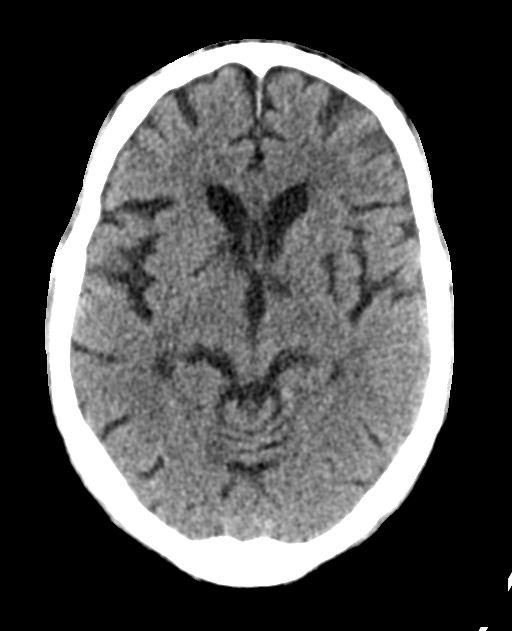
[im 17/28  brain]
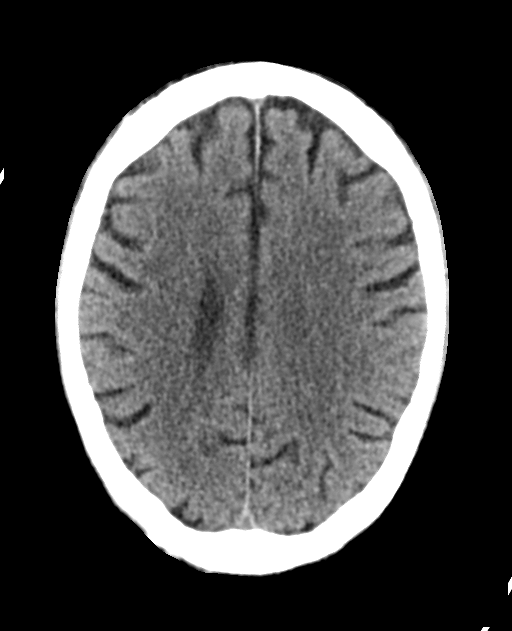
[im 22/28  brain]
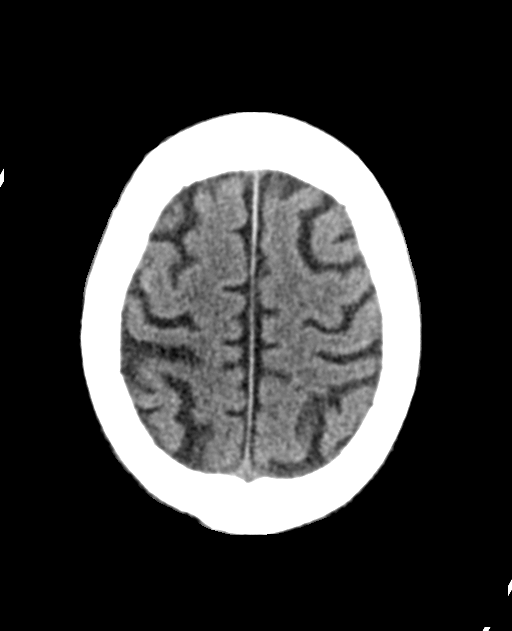

[15 of 47 positions shown; findings below may reference images not displayed]

FINDINGS: No evidence of parenchymal hemorrhage or extra-axial fluid
collection. No mass lesion, mass effect, or midline shift.

No CT evidence of acute infarction.

Lacunar infarcts in the left corona radiata and basal ganglia.

Subcortical white matter and periventricular small vessel ischemic
changes.

Cerebral volume is within normal limits.  No ventriculomegaly.

New complete opacification of the bilateral maxillary sinuses.
Partial opacification of the bilateral ethmoid and sphenoid sinuses.
Partial opacification of the right mastoid air cells.

No evidence of calvarial fracture.
IMPRESSION: No evidence of acute intracranial abnormality.

Lacunar infarcts in the left basal ganglia and corona radiata.

## 2017-11-23 IMAGING — DX DG CHEST 1V PORT
1 series · 1 of 1 positions shown · non-contrast
Comparison: 06/30/2016

CLINICAL DATA: Altered mental status

EXAM:
PORTABLE CHEST 1 VIEW

[chest ap]
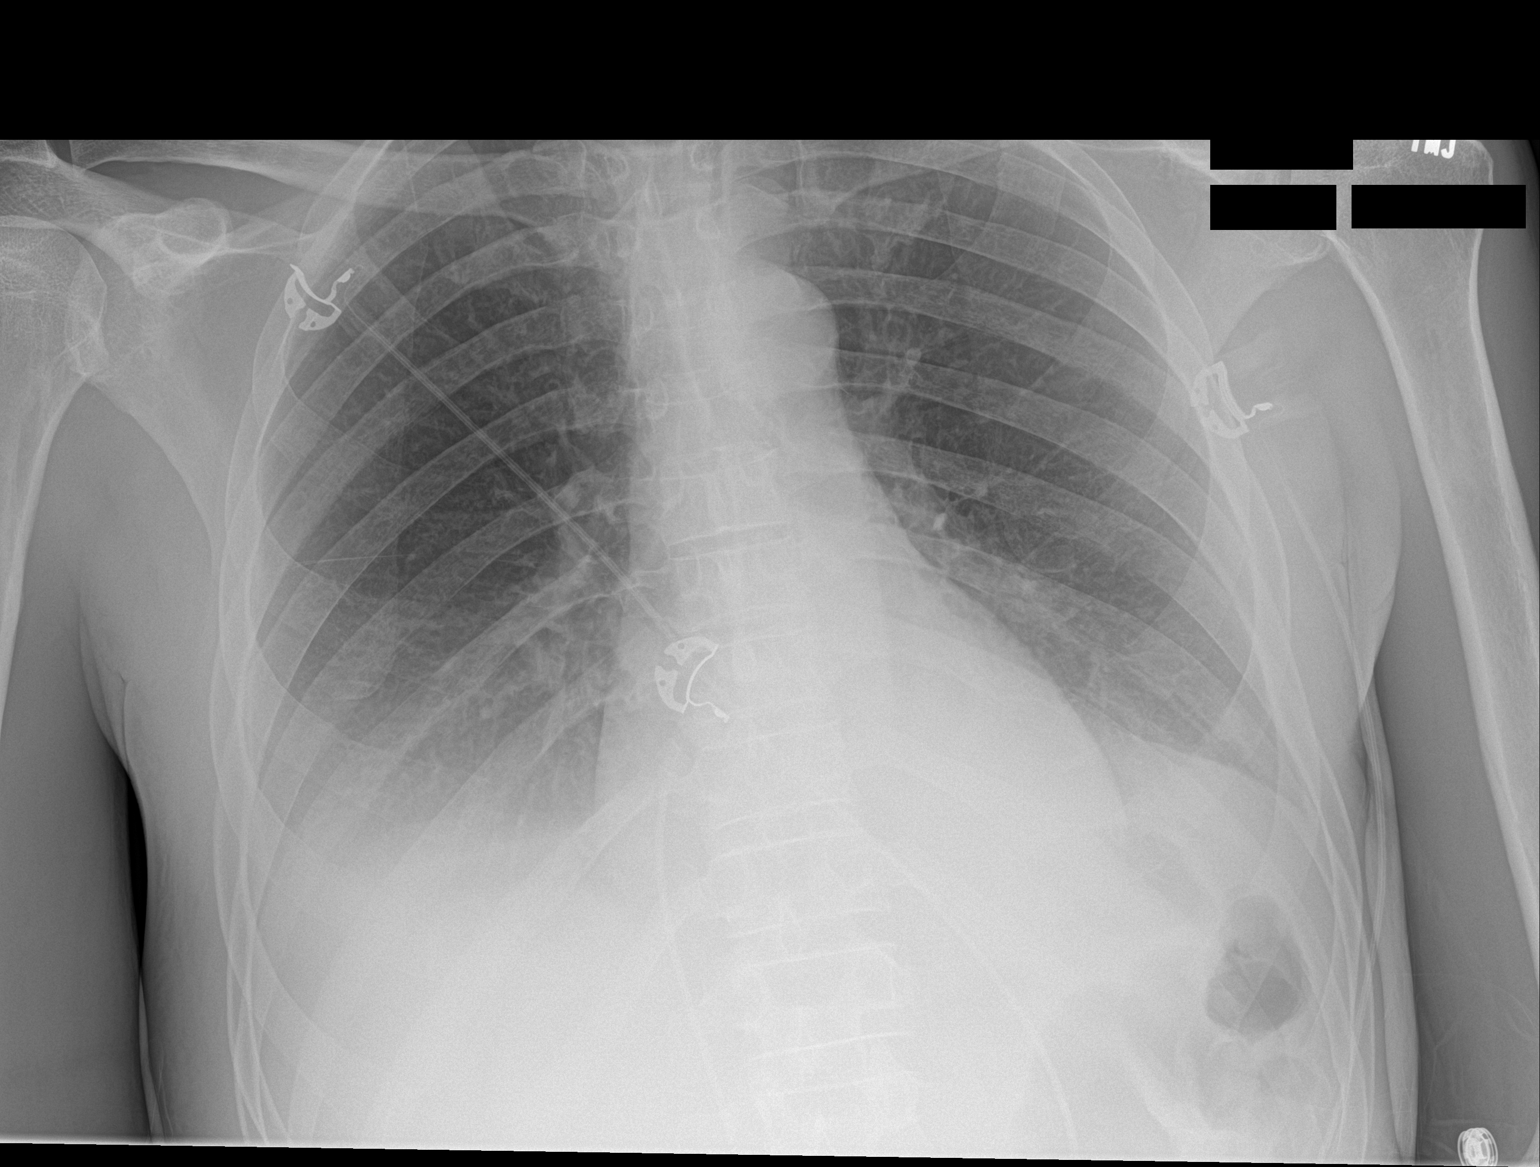

[1 of 1 positions shown; findings below may reference images not displayed]

FINDINGS: Stable hazy appearance of the bases which is likely combination of
atelectasis and pleural fluid. No edema, air bronchograms, or
pneumothorax. Normal heart size and mediastinal contours.
IMPRESSION: Stable appearance of the chest. Hazy basilar opacities again favor
small effusions and atelectasis.

## 2017-11-24 IMAGING — DX DG PELVIS 1-2V
1 series · 1 of 1 positions shown · non-contrast
Comparison: None.

CLINICAL DATA: Fall

EXAM:
PELVIS - 1-2 VIEW

[pelvis ap]
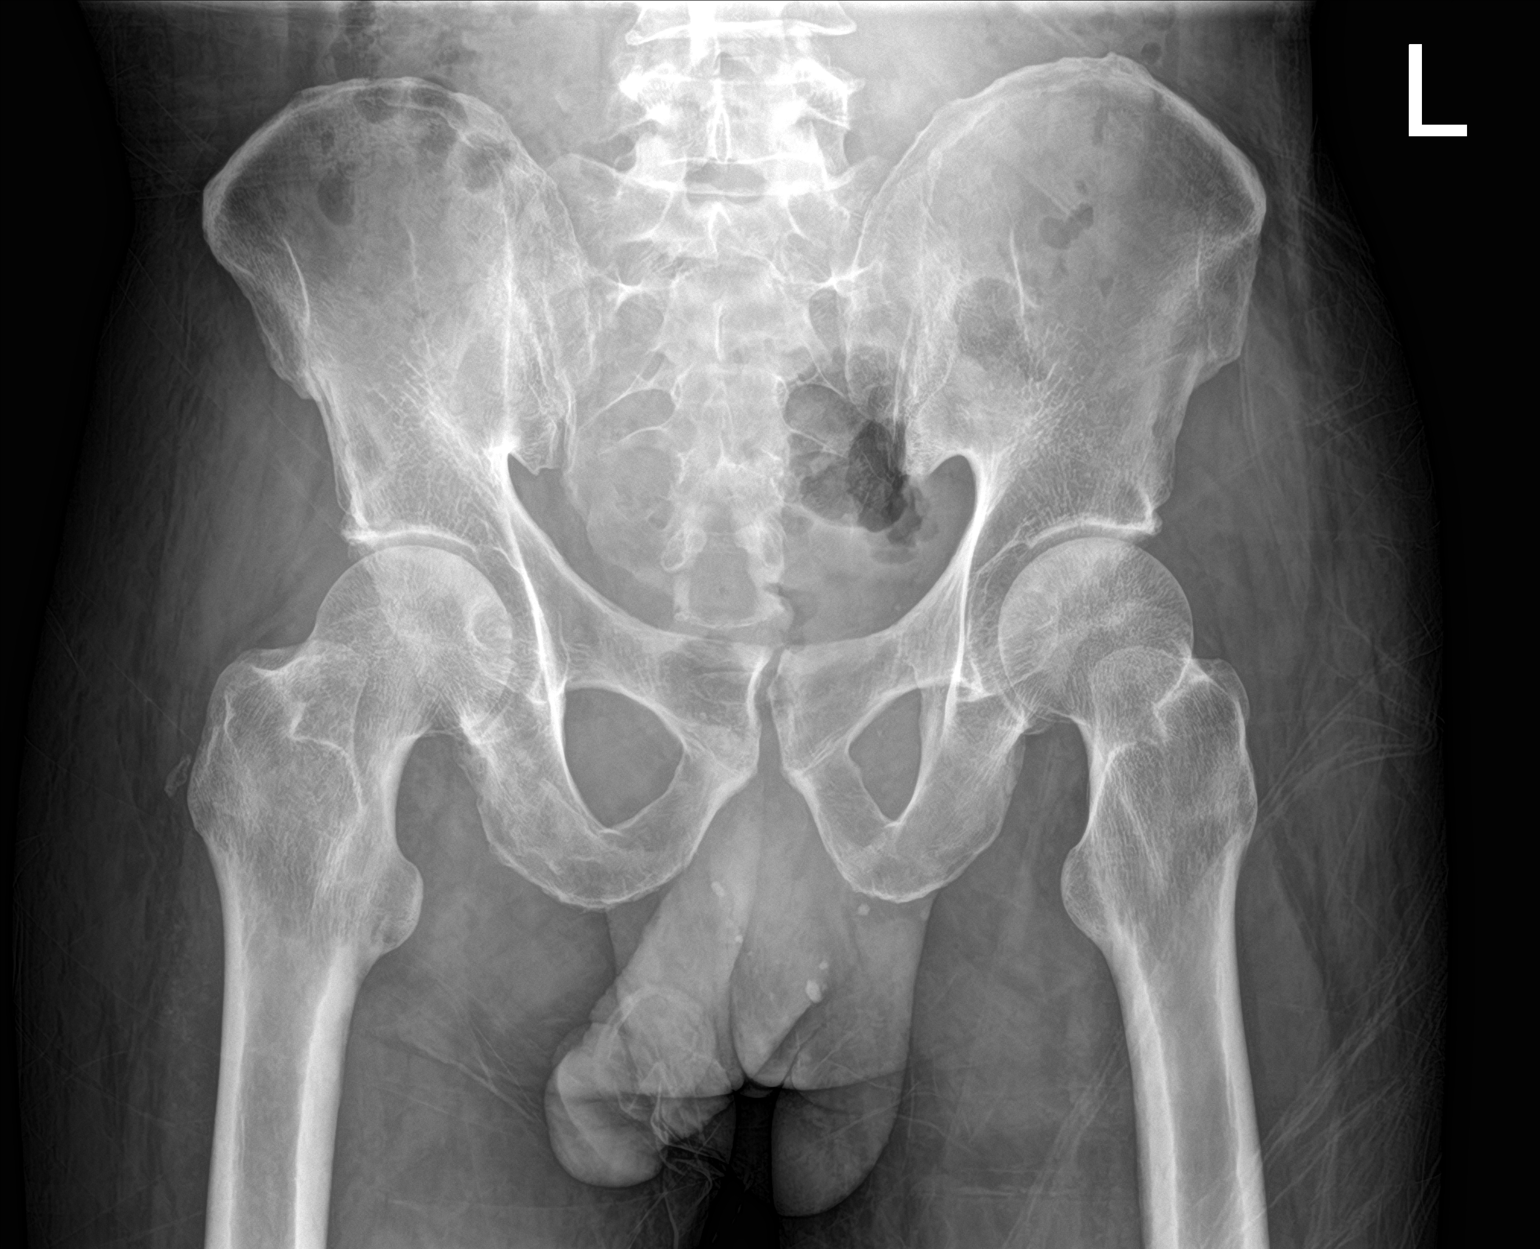

[1 of 1 positions shown; findings below may reference images not displayed]

FINDINGS: No fracture or dislocation is seen.

Follow hip joint spaces are preserved.

Visualized bony pelvis appears intact.
IMPRESSION: No fracture or dislocation is seen.

## 2017-11-30 IMAGING — CR DG CHEST 1V PORT
2 series · 2 of 2 positions shown · non-contrast
Comparison: 07/05/2016

CLINICAL DATA: Central line placement.  Unresponsive.

EXAM:
PORTABLE CHEST 1 VIEW

[ap portable (1 of 2)]
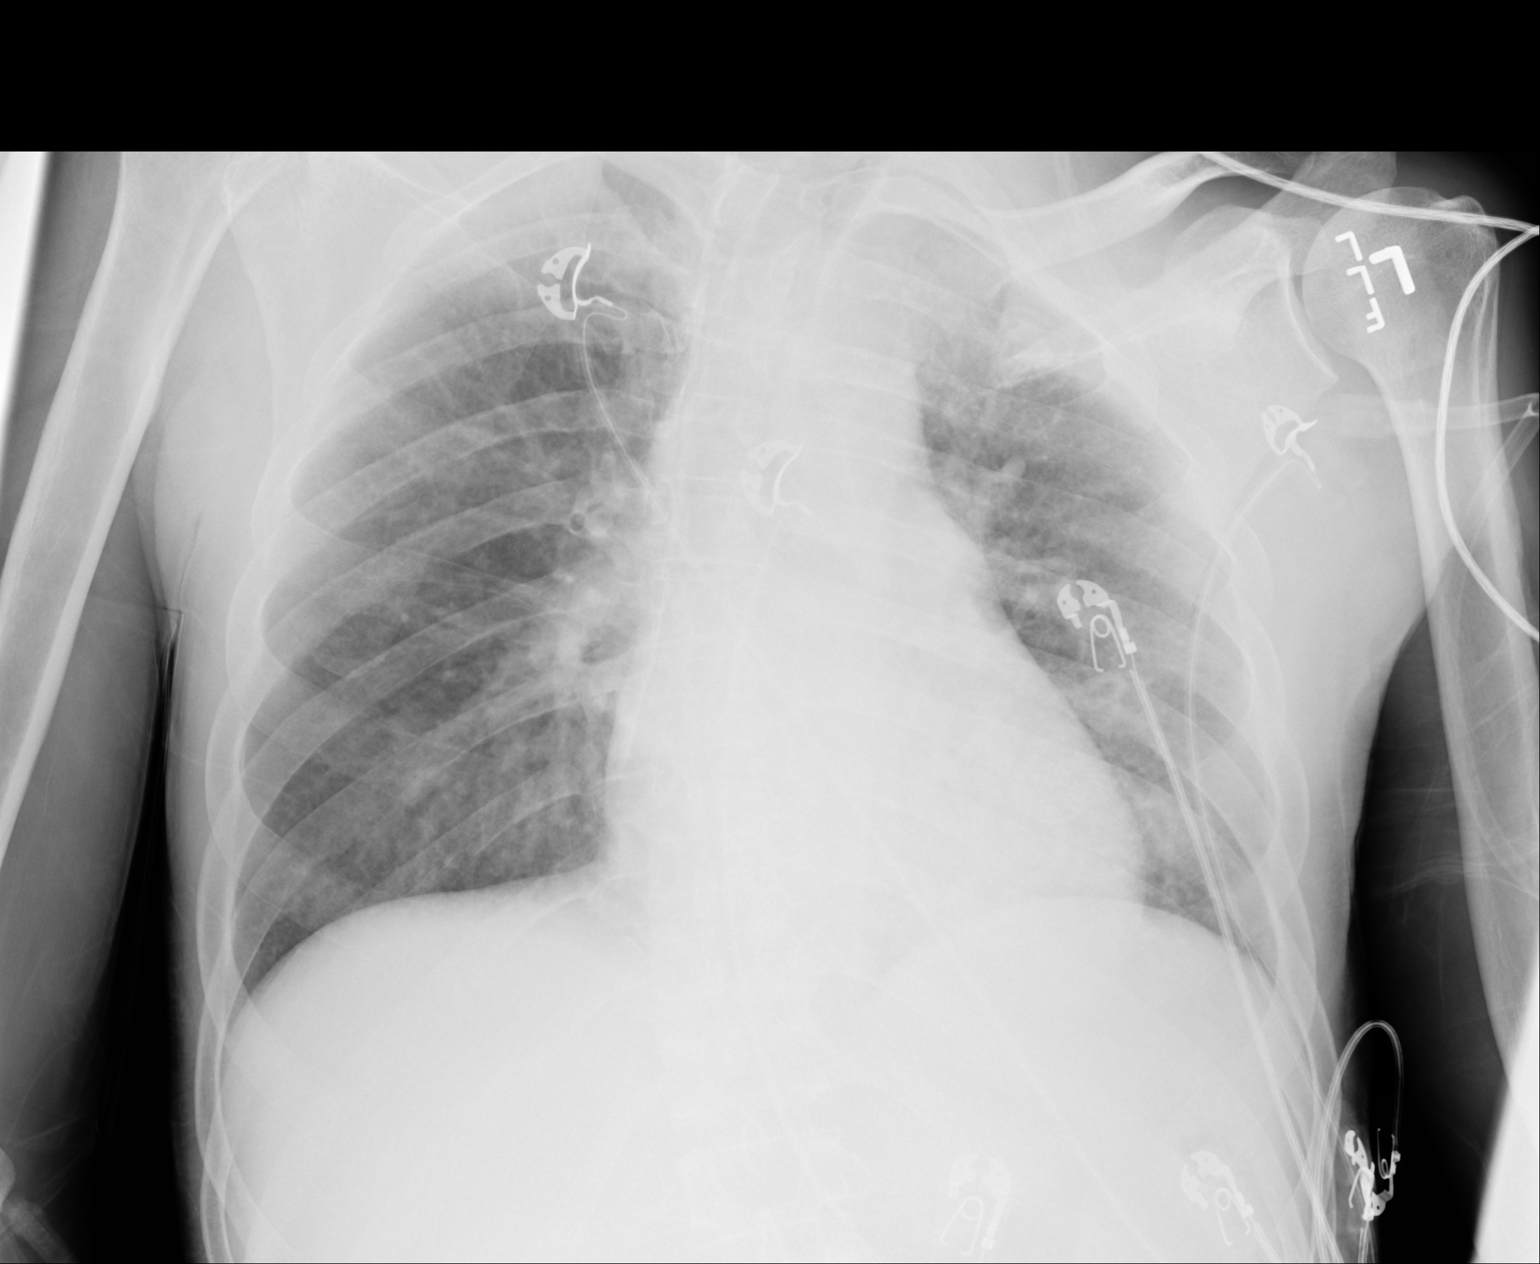

[ap portable (2 of 2)]
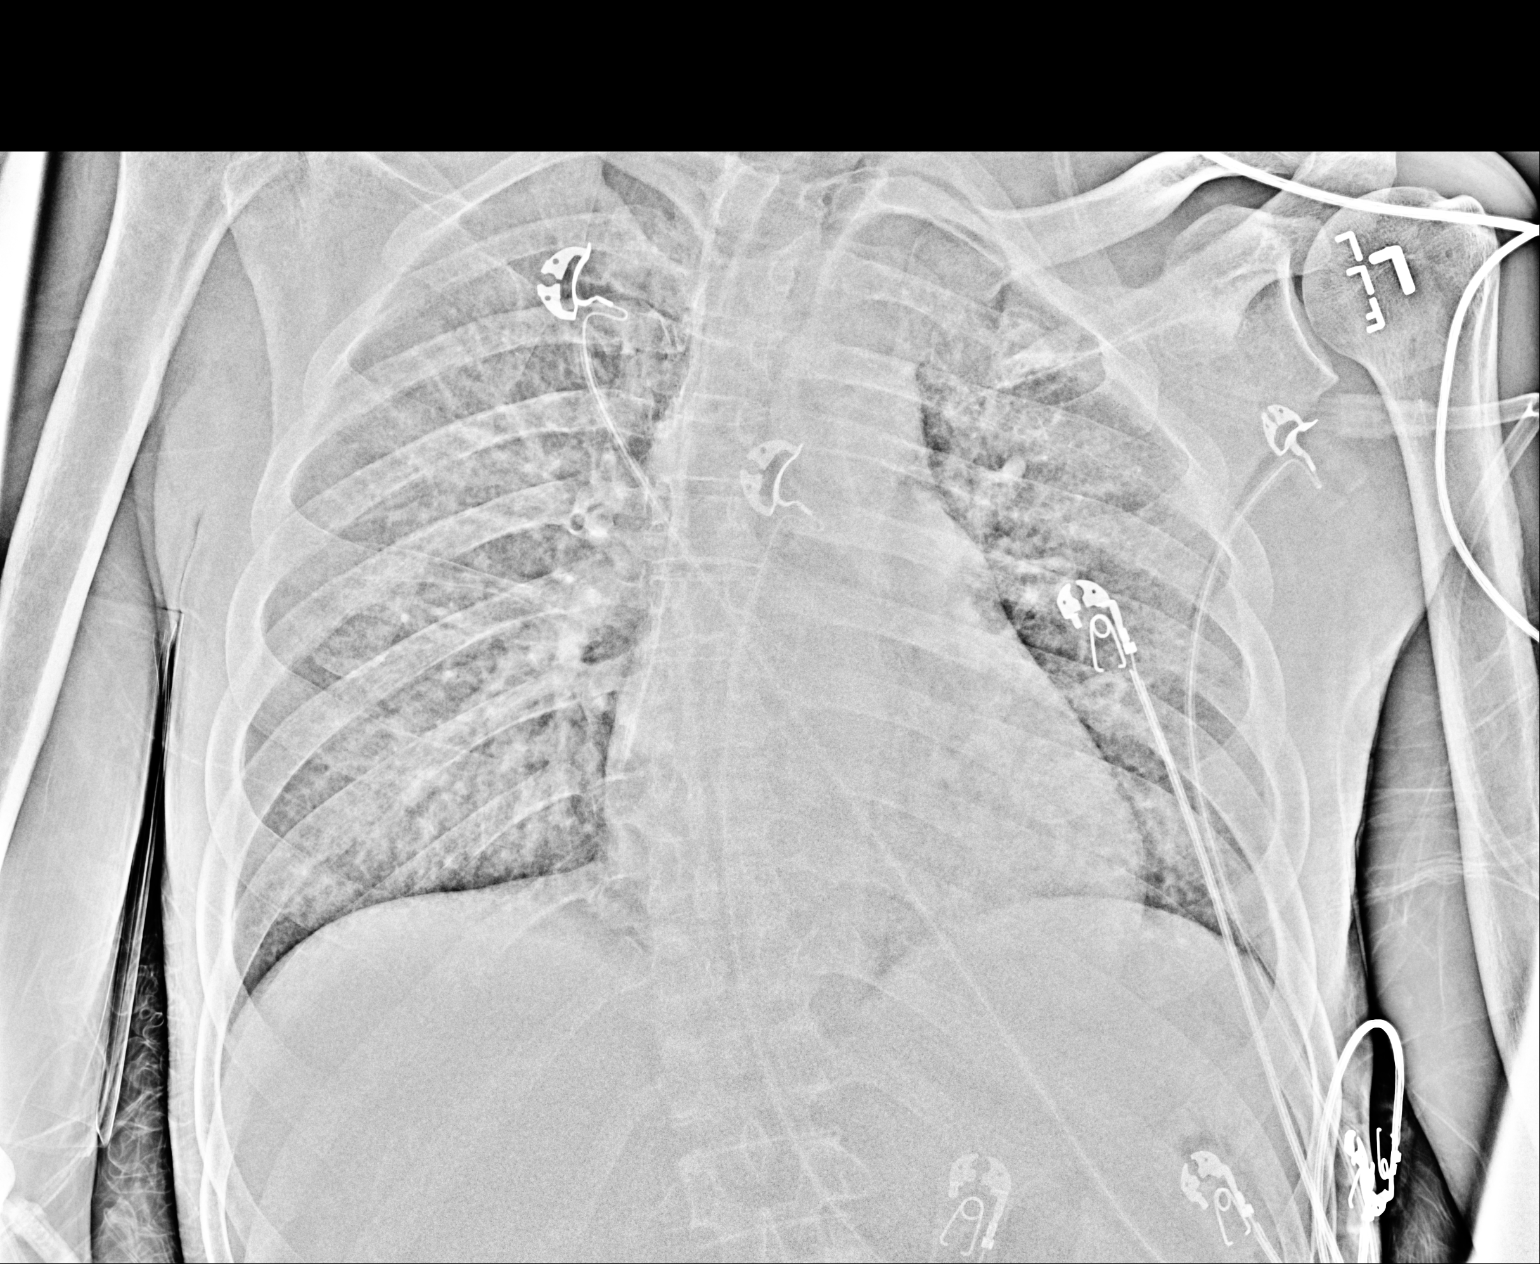

[2 of 2 positions shown; findings below may reference images not displayed]

FINDINGS: Indistinct upper zone vasculature probably at least partially due to
supine positioning. Heart size within normal limits.

Right IJ line tip just over 3 vertebral body lengths below the
carina, compatible with the right atrium. If cavoatrial junction
positioning is desired, consider retraction by 3.5 cm.

Low lung volumes are present, causing crowding of the pulmonary
vasculature. No airspace opacity identified.
IMPRESSION: 1. Right IJ line tip:  Right atrium.  No pneumothorax.
2. Low lung volumes are present, causing crowding of the pulmonary
vasculature.

## 2017-11-30 IMAGING — CR DG CHEST 1V PORT
1 series · 1 of 1 positions shown · non-contrast
Comparison: 07/12/2016 at 8869 hours

CLINICAL DATA: Status post intubation.

EXAM:
PORTABLE CHEST 1 VIEW

[ap portable]
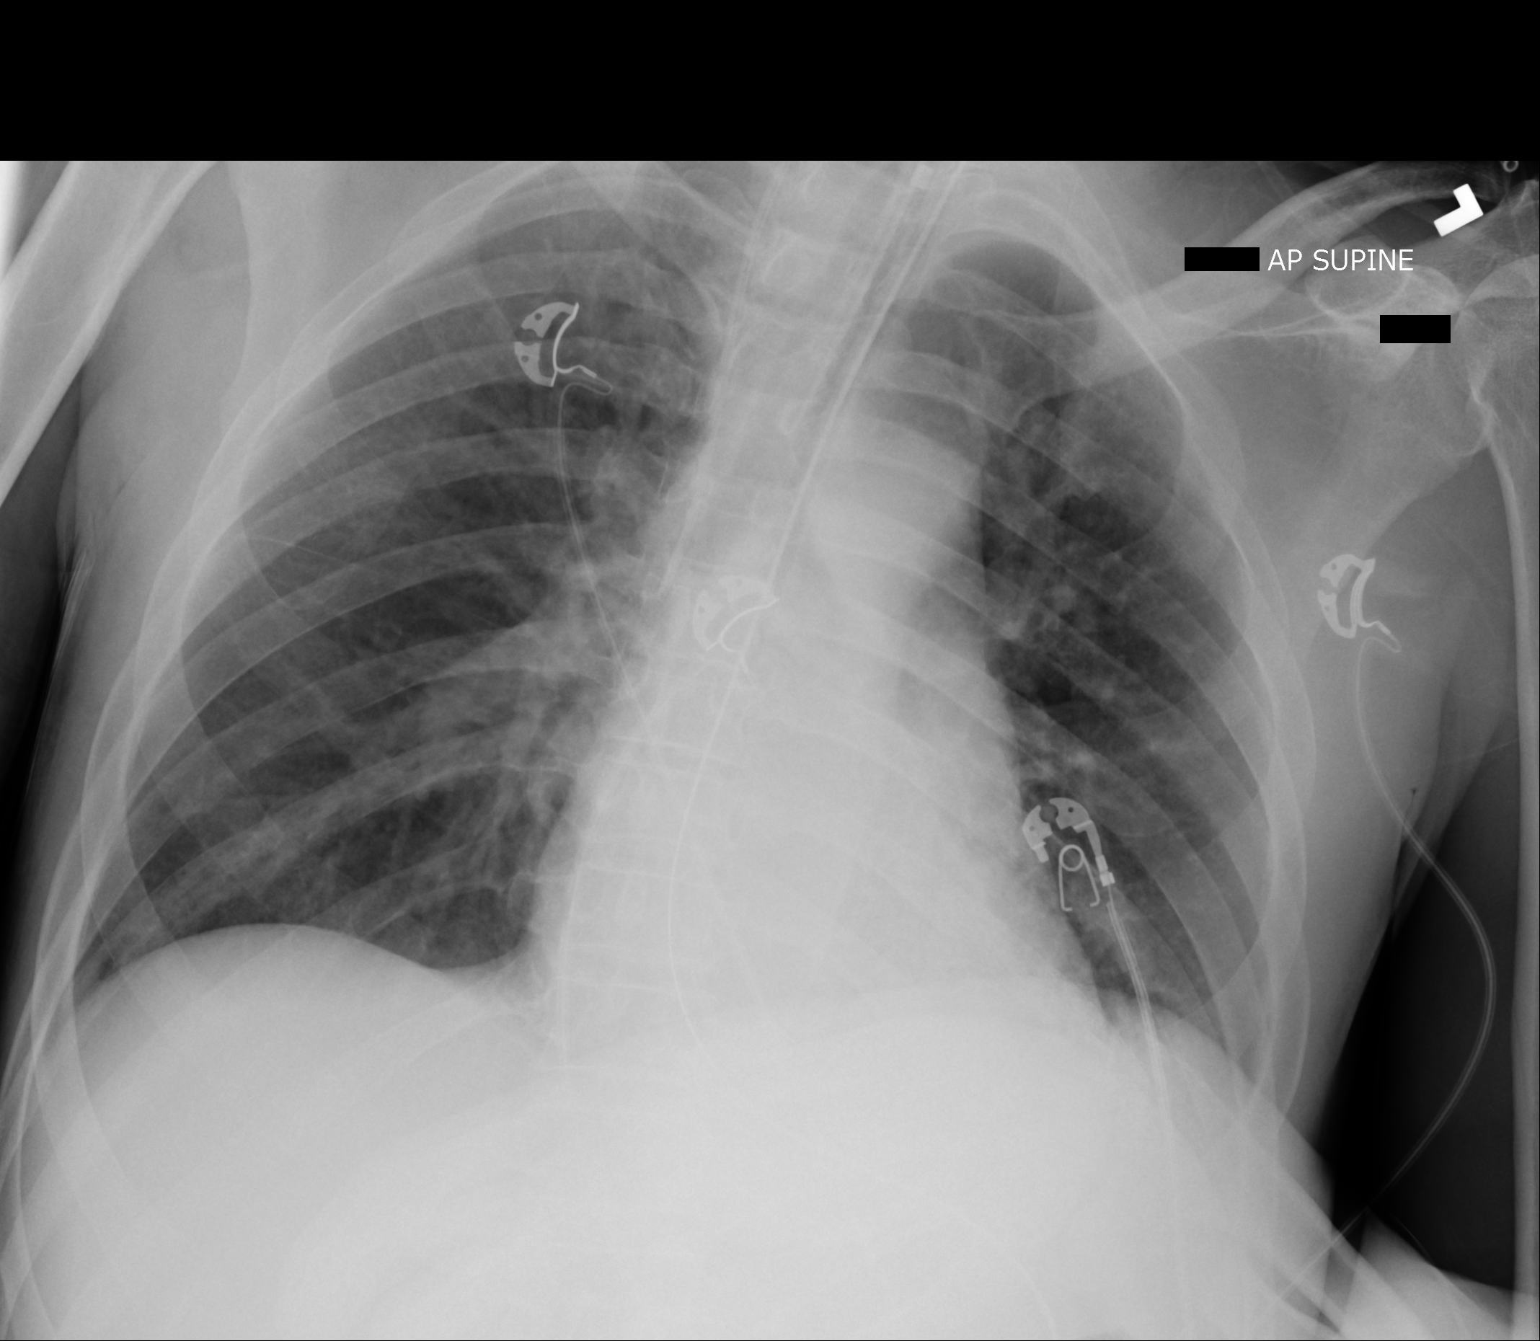

[1 of 1 positions shown; findings below may reference images not displayed]

FINDINGS: Endotracheal tube tip projects 19 mm above the carina, well
positioned.

New nasal/ orogastric tube extends well below the diaphragm into the
stomach and below the included field of view.

Right internal jugular central venous line tip now projects in the
lower superior vena cava above the right atrium and caval atrial
junction.

Lungs are clear.  No pleural effusion or pneumothorax.
IMPRESSION: 1. Well-positioned endotracheal tube, nasal/orogastric tube and
right internal jugular central venous line.
2. No pneumothorax.  No acute findings in the lungs.

## 2017-11-30 IMAGING — CT CT HEAD W/O CM
4 series · 16 of 47 positions shown, 18 images · non-contrast
Comparison: 07/06/2016

CLINICAL DATA: Unresponsive, mental status change, history
dementia, meningitis, CHF, stroke

EXAM:
CT HEAD WITHOUT CONTRAST
TECHNIQUE: Contiguous axial images were obtained from the base of the skull
through the vertex without intravenous contrast.

[Series 2: head w/o · axial · non-contrast · 0.42mm/px · z∈[+92,+220]mm · 8 of 42 slices shown, 10 images]
[im 5/42  brain]
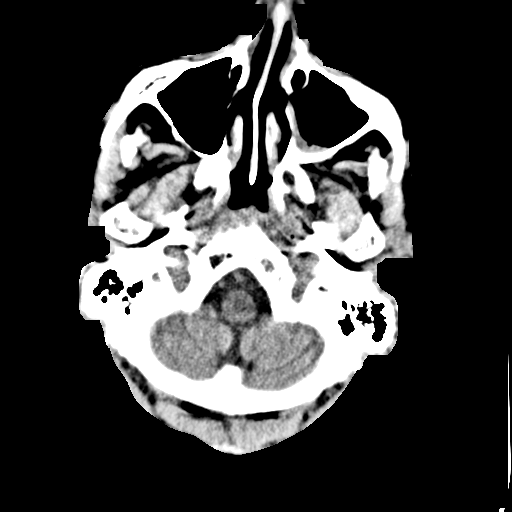
[im 5/42  bone]
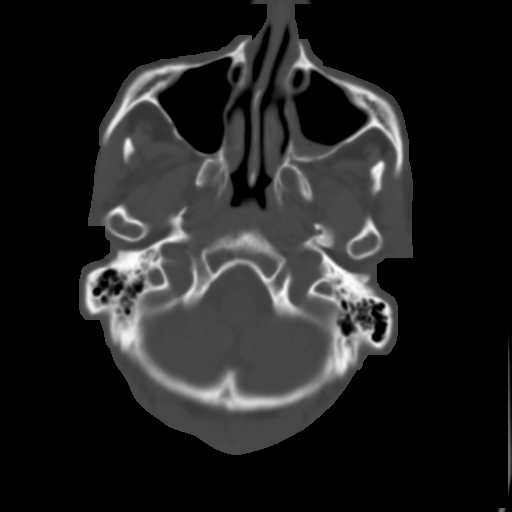
[im 10/42  brain]
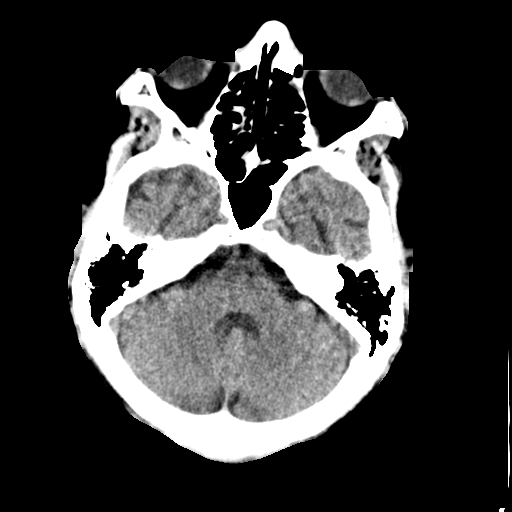
[im 14/42  brain]
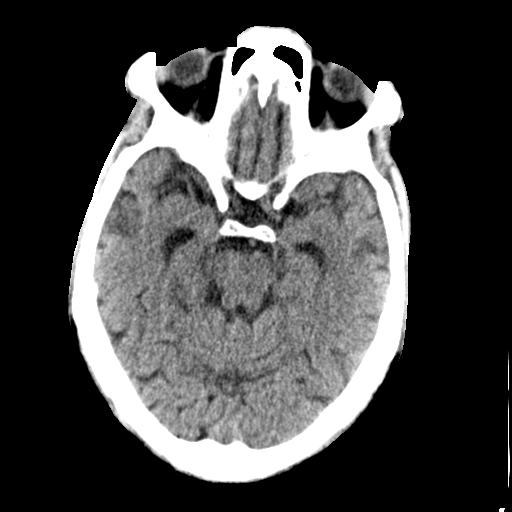
[im 19/42  brain]
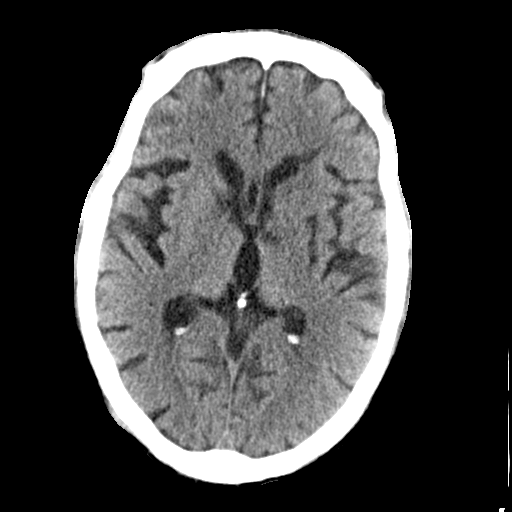
[im 23/42  brain]
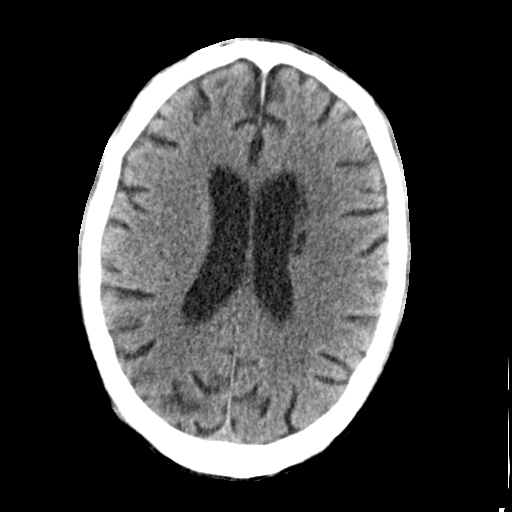
[im 23/42  bone]
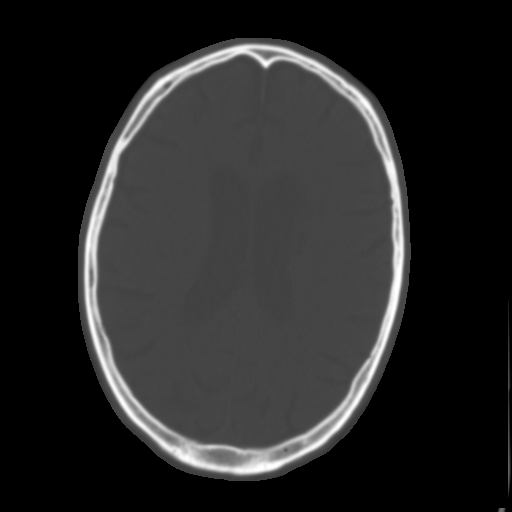
[im 28/42  brain]
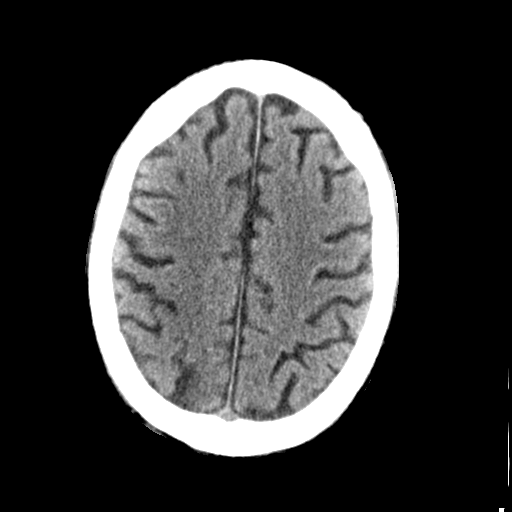
[im 32/42  brain]
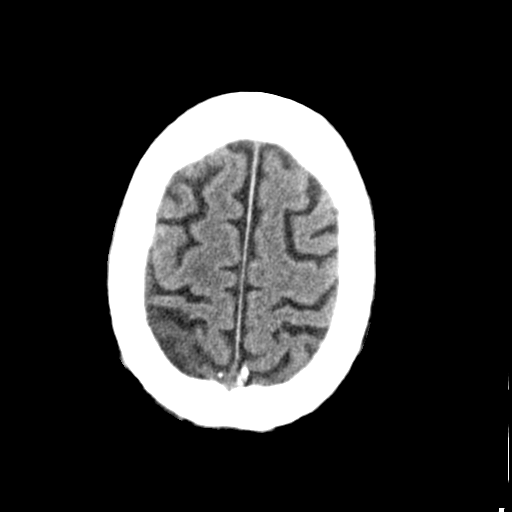
[im 37/42  brain]
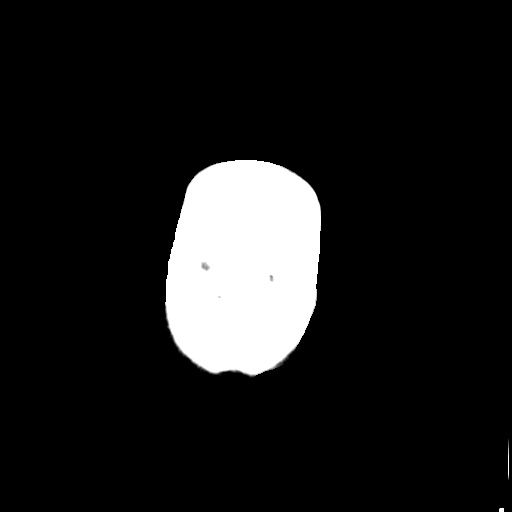

[Series 3: head bone · axial · 0.42mm/px · z∈[+92,+110]mm · 2 of 83 slices shown]
[im 9/83  bone]
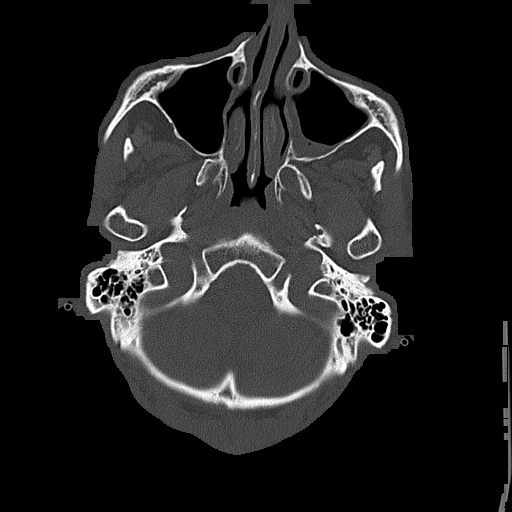
[im 18/83  bone]
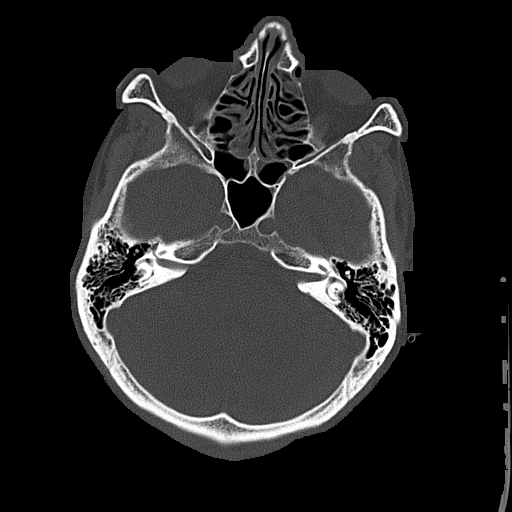

[Series 4: coronal · coronal · 0.30mm/px · 3 of 67 slices shown]
[im 23/67  brain]
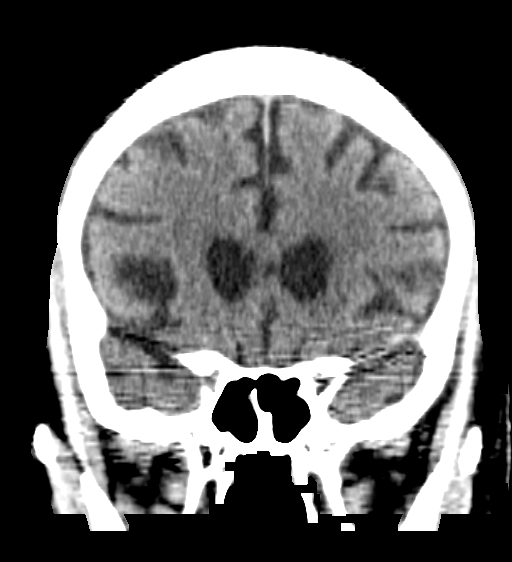
[im 30/67  brain]
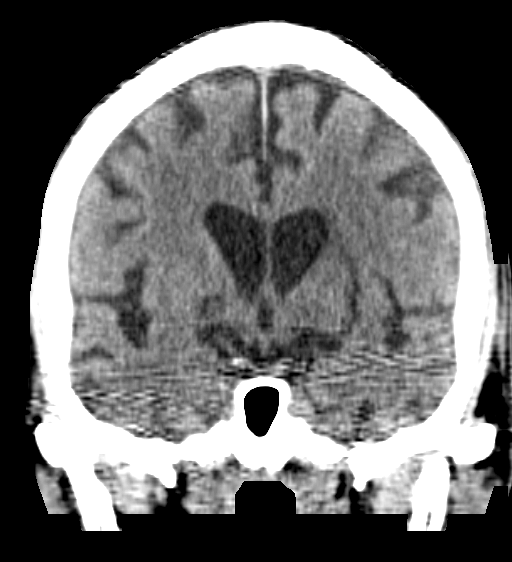
[im 37/67  brain]
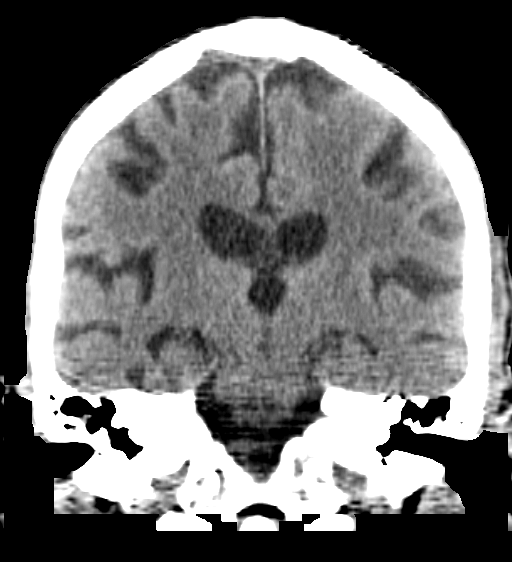

[Series 5: sagittal · sagittal · 0.32mm/px · 3 of 53 slices shown]
[im 18/53  brain]
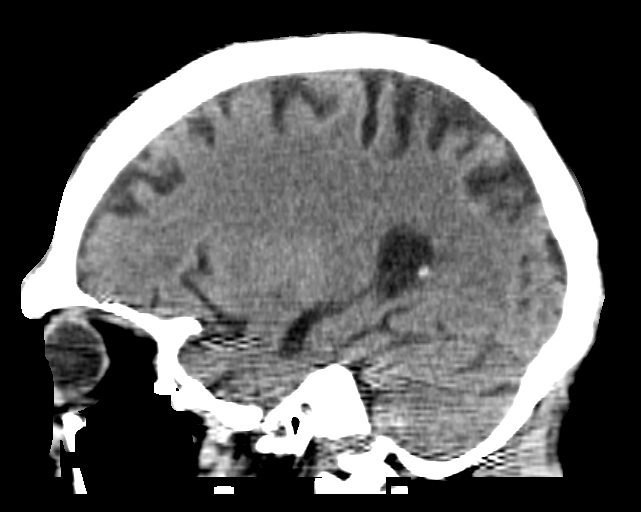
[im 27/53  brain]
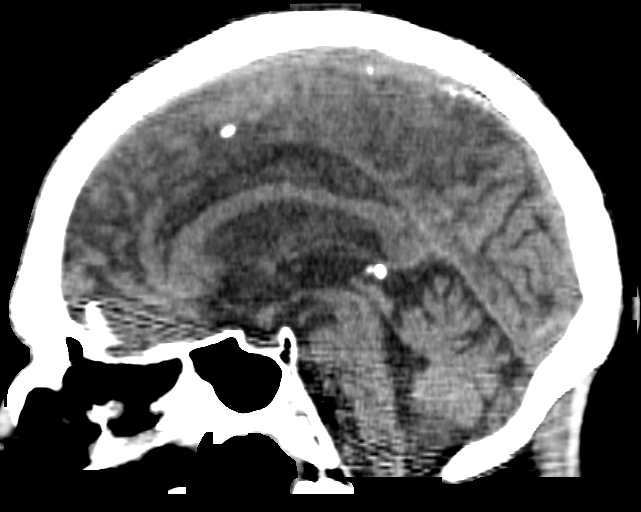
[im 35/53  brain]
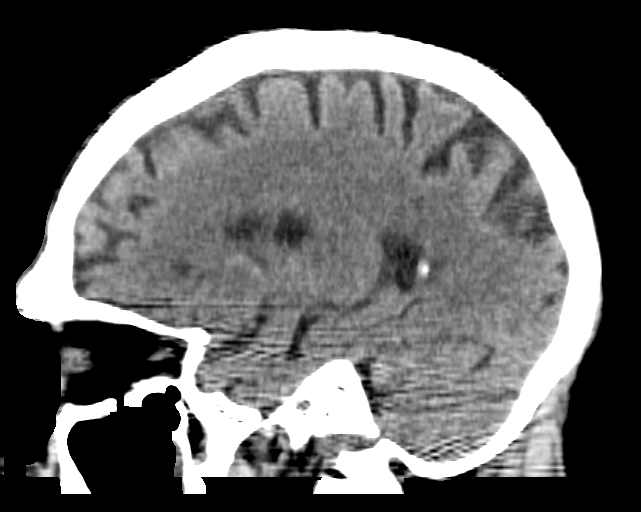

[16 of 47 positions shown; findings below may reference images not displayed]

FINDINGS: Generalized atrophy.

Normal ventricular morphology.

No midline shift or mass effect.

Old BILATERAL basal ganglia lacunar infarcts.

Subacute inferior LEFT caudate infarct as seen on prior study.

Minimal small vessel chronic ischemic changes of deep cerebral white
matter.

No intracranial hemorrhage, mass lesion or evidence of cortical
infarction.

No extra-axial fluid collections.

Air-fluid level LEFT maxillary sinus.

Remaining paranasal sinuses and mastoid air cells clear.

Bones unremarkable.
IMPRESSION: Atrophy with minimal small vessel chronic ischemic changes of deep
cerebral white matter.

Old BILATERAL basal ganglia lacunar infarcts with a subacute infarct
again identified at the inferior LEFT caudate.

No new intracranial abnormalities.

## 2017-12-01 IMAGING — CR DG CHEST 1V PORT
1 series · 1 of 1 positions shown · non-contrast
Comparison: 07/12/2016.

CLINICAL DATA: Respiratory failure.

EXAM:
PORTABLE CHEST 1 VIEW

[AP]
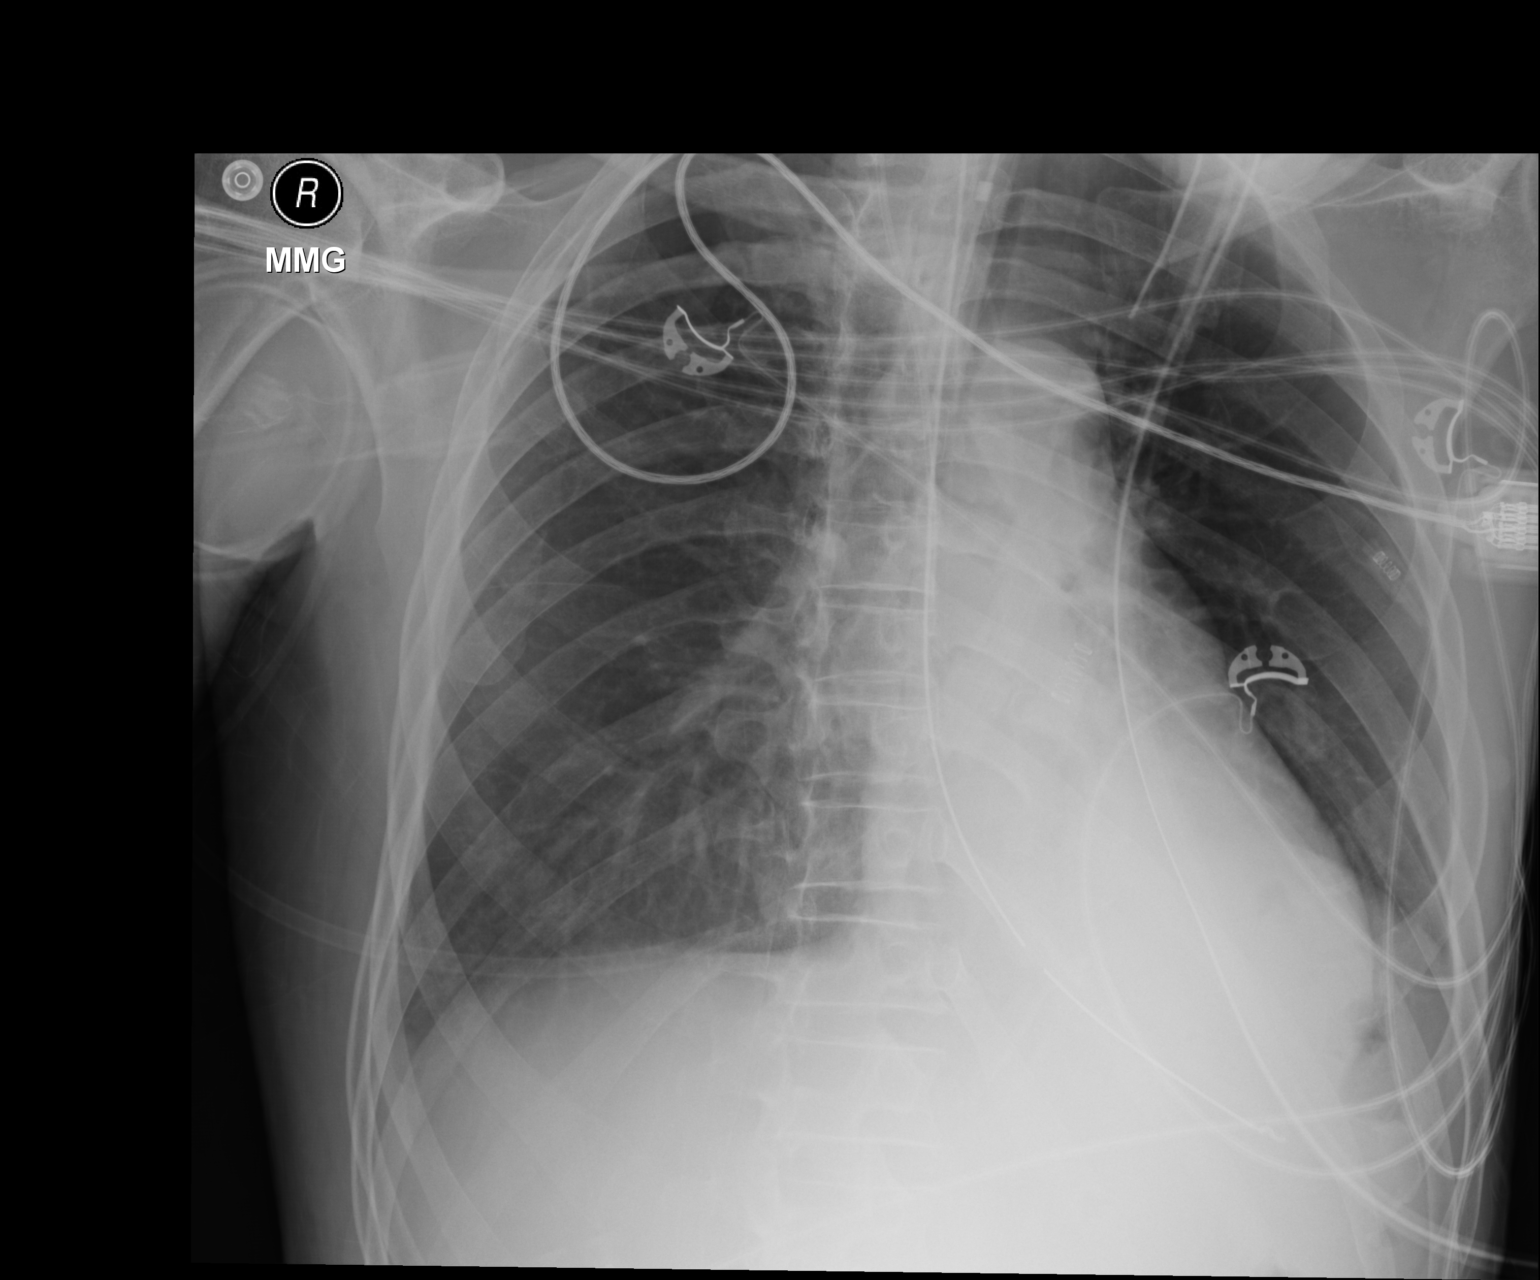

[1 of 1 positions shown; findings below may reference images not displayed]

FINDINGS: Endotracheal tube in stable position. NG tube tip noted in the upper
stomach. Side hole gastroesophageal junction. Advancement of the NG
tube by approximately 8 cm suggested. Heart size stable. Progressive
left lower lobe atelectasis and/or infiltrate. Small left pleural
effusion. No pneumothorax.
IMPRESSION: 1. Endotracheal tube in stable position. NG tube tip noted in the
upper stomach. Side hole at the gastroesophageal junction.
Advancement of the NG tube by approximately 8 cm suggested.

2. Progressive left lower lobe atelectasis and or infiltrate. Small
left pleural effusion.

## 2017-12-02 IMAGING — CR DG CHEST 1V PORT
1 series · 1 of 1 positions shown · non-contrast
Comparison: 07/13/2016.

CLINICAL DATA: Respiratory failure.

EXAM:
PORTABLE CHEST 1 VIEW

[AP]
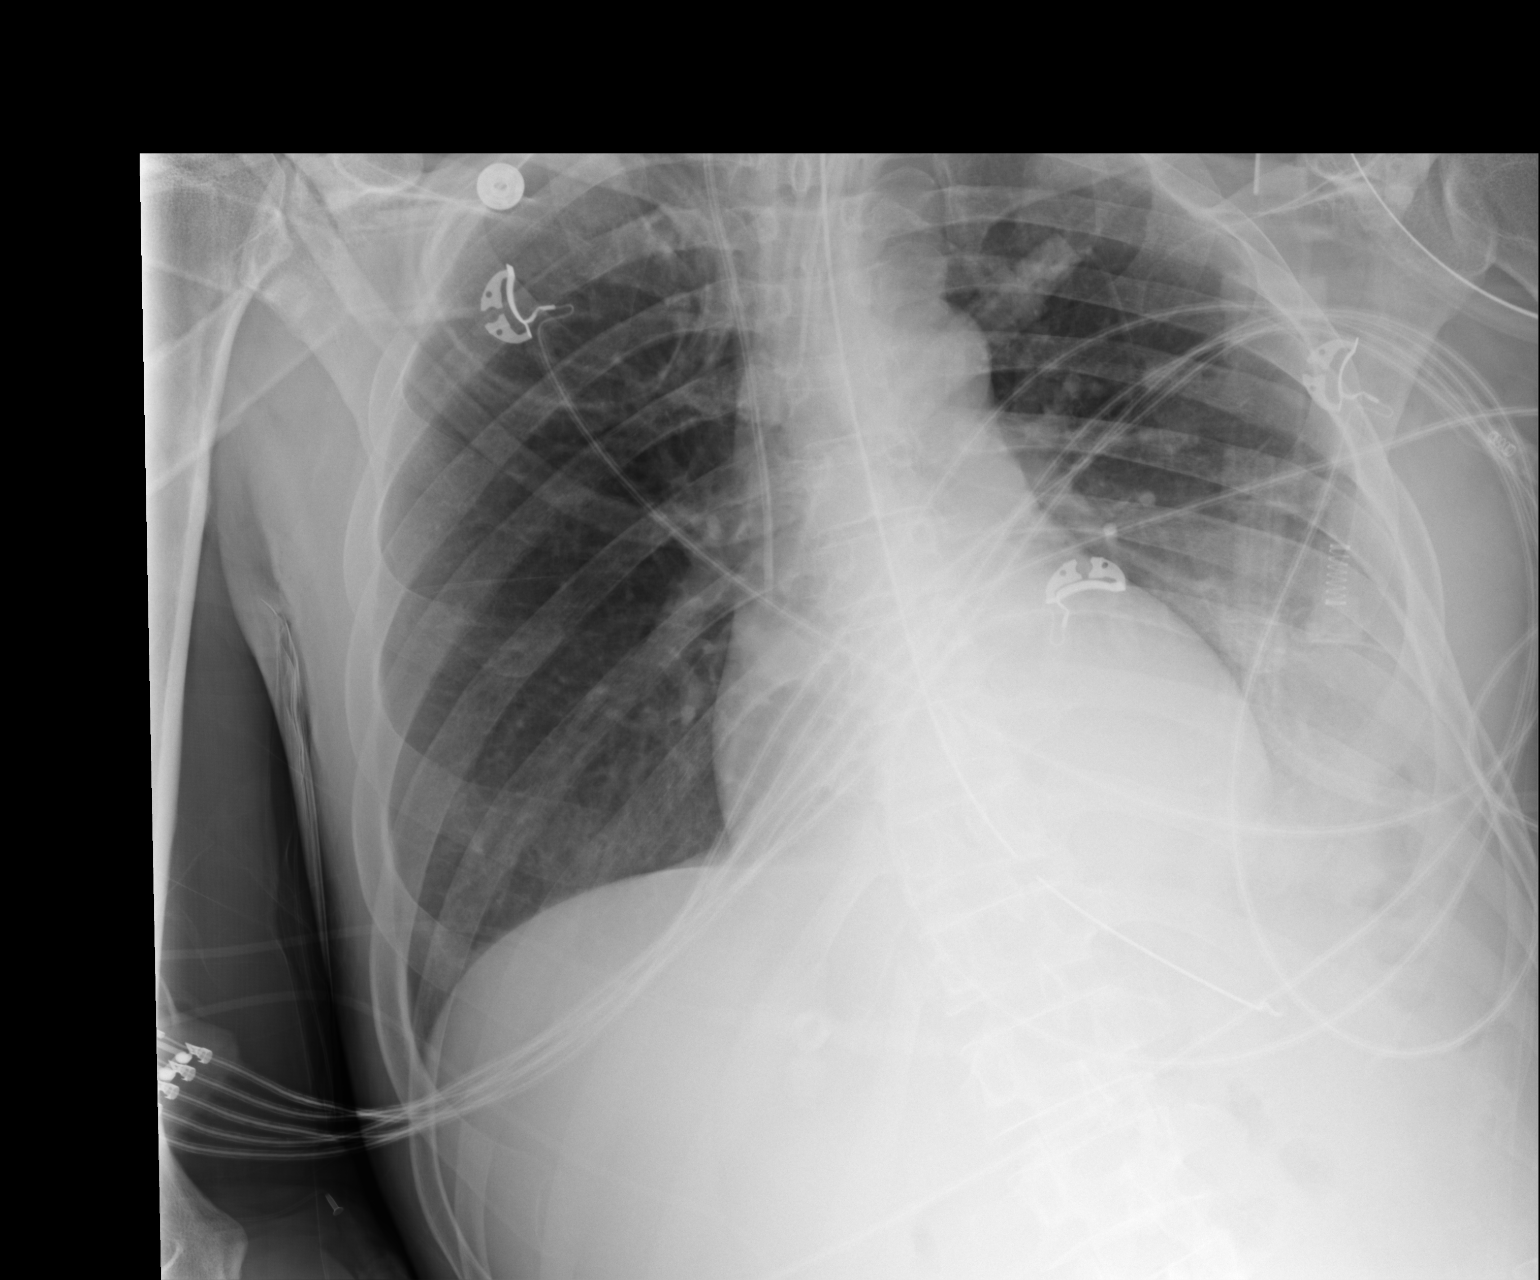

[1 of 1 positions shown; findings below may reference images not displayed]

FINDINGS: Interim placement right IJ line. Tip at the cavoatrial junction.
Endotracheal tube in stable position. NG tube tip noted in the
stomach. The NG tube side hole is at the gastroesophageal junction.
Advancement of the NG tube of approximately 8 cm should be
considered. Interim progression of left lower lobe infiltrate and
atelectasis. Small left pleural effusion cannot be excluded. No
pneumothorax.
IMPRESSION: 1. Interim placement of right IJ line, its tip is at the cavoatrial
junction. Endotracheal tube in stable position. NG tube tip noted
projected over the upper stomach, NG tube side hole at the
gastroesophageal junction. Advancement of approximately 8 cm should
be considered.
2. Progressive left lower lobe infiltrate atelectasis. Small left
pleural effusion cannot be excluded.
These results will be called to the ordering clinician or
representative by the Radiologist Assistant, and communication
documented in the PACS or zVision Dashboard.

## 2017-12-03 IMAGING — MR MR HEAD W/O CM
8 of 10 series · 34 of 48 positions shown · non-contrast
Comparison: Prior CT from 07/12/2016 as well as multiple previous
MRIs.

CLINICAL DATA: Initial evaluation for acute stroke. History of
recent strep pneumo meningitis and subacute CVA. Fall he or the way
he all walking and

EXAM:
MRI HEAD WITHOUT CONTRAST
TECHNIQUE: Multiplanar, multiecho pulse sequences of the brain and surrounding
structures were obtained without intravenous contrast.

[Series 3: T1 · sagittal · 5.0mm · 0.47mm/px · 3 of 23 slices shown]
[im 1/23]
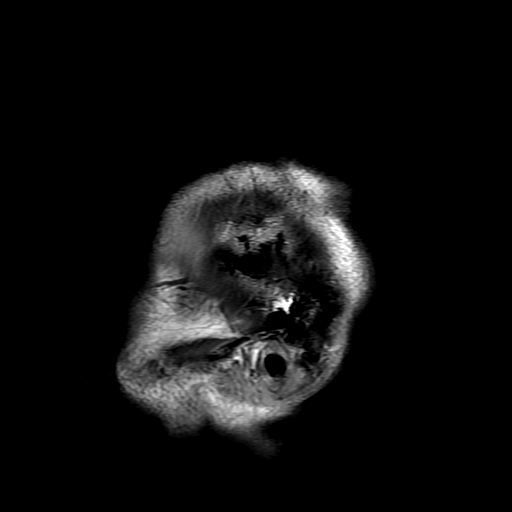
[im 12/23]
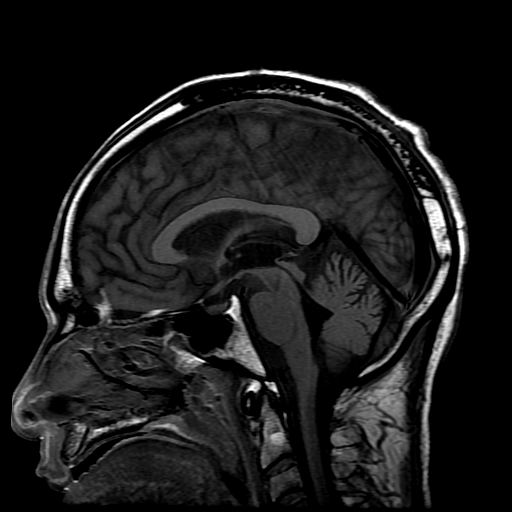
[im 23/23]
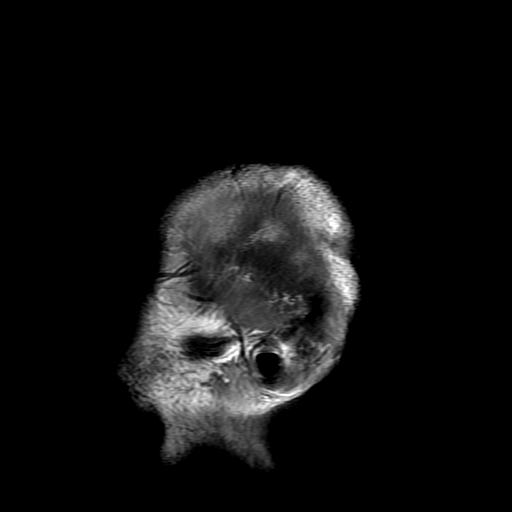

[Series 4: DWI · axial · 3.0mm · 1.09mm/px · z∈[-79,+62]mm · 8 of 98 slices shown (1 of 4)]
[im 1/98]
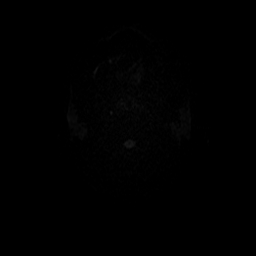
[im 11/98]
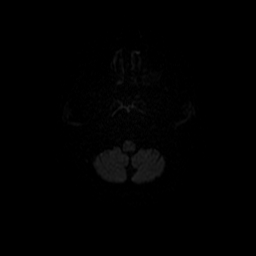
[im 33/98]
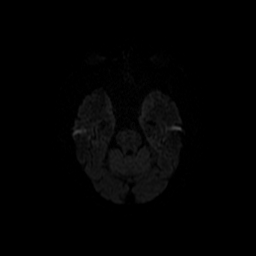
[im 44/98]
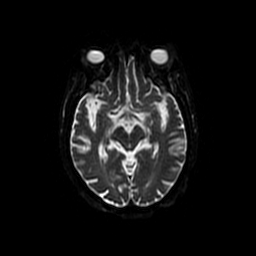
[im 54/98]
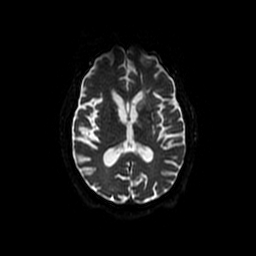
[im 65/98]
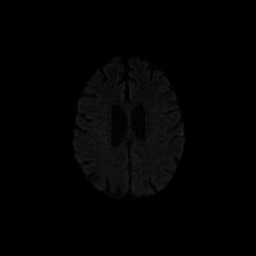
[im 87/98]
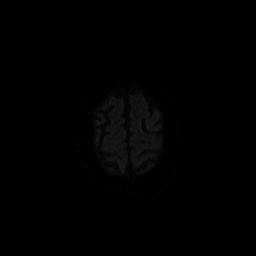
[im 98/98]
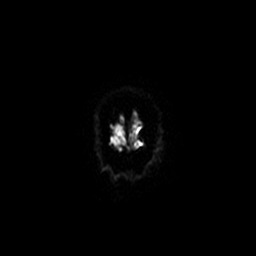

[Series 5: T2 · axial · 5.0mm · 0.43mm/px · z∈[-74,+68]mm · 2 of 25 slices shown (1 of 2)]
[im 1/25]
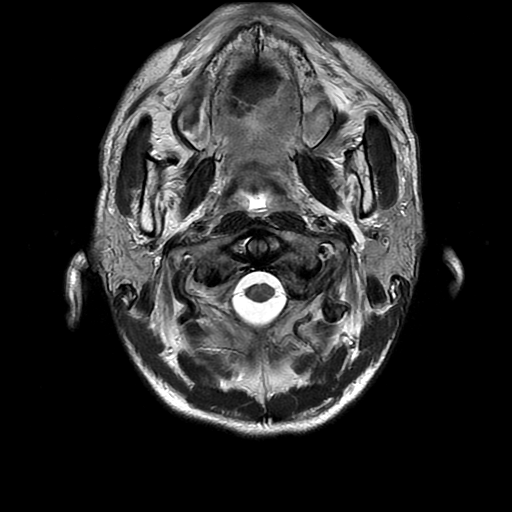
[im 25/25]
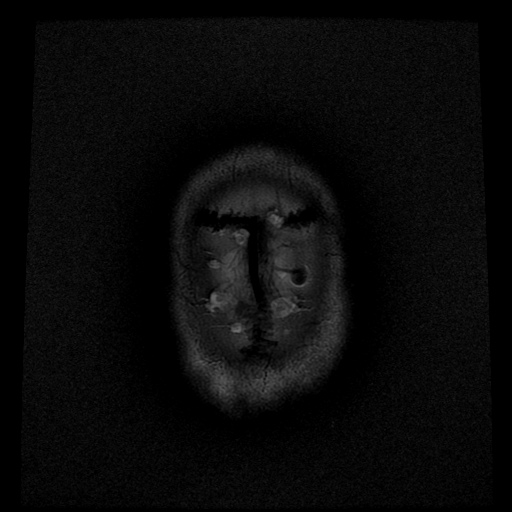

[Series 6: FLAIR · axial · 5.0mm · 0.43mm/px · z∈[-74,+68]mm · 2 of 25 slices shown]
[im 1/25]
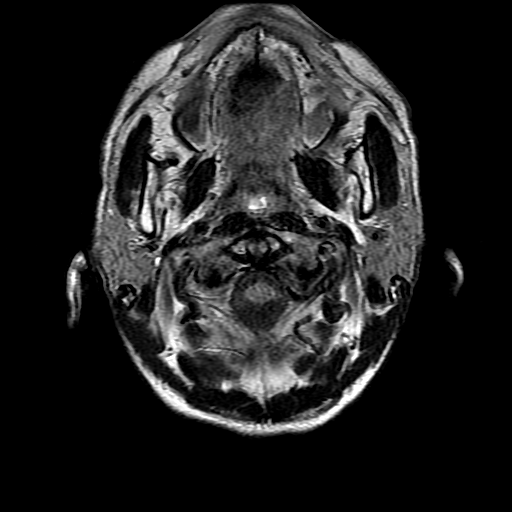
[im 25/25]
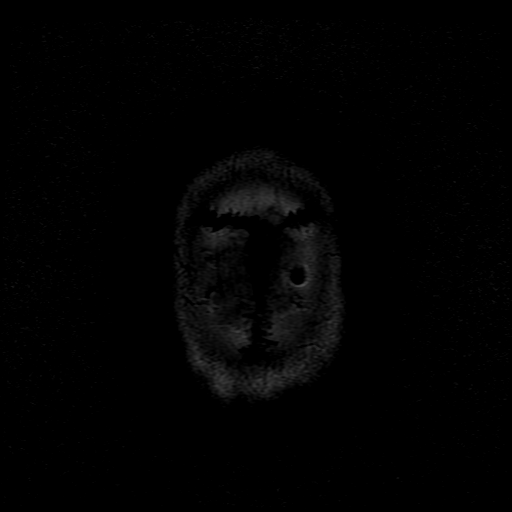

[Series 7: DWI · coronal · 5.0mm · 1.09mm/px · 7 of 72 slices shown (2 of 4)]
[im 1/72]
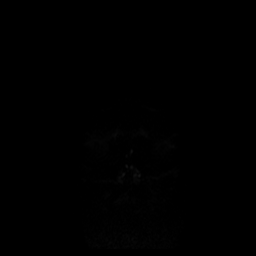
[im 12/72]
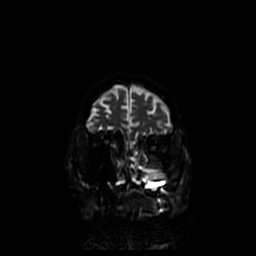
[im 24/72]
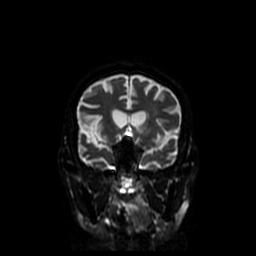
[im 36/72]
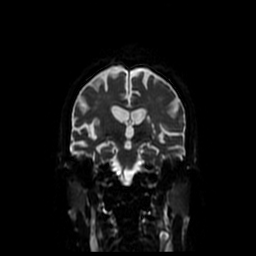
[im 48/72]
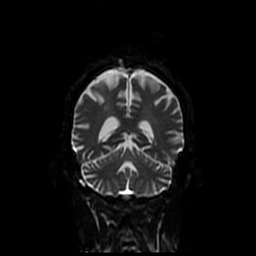
[im 60/72]
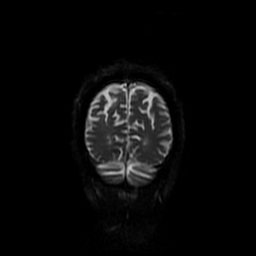
[im 72/72]
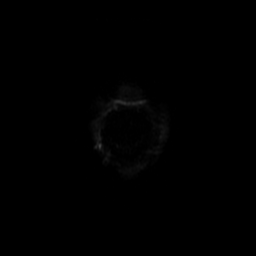

[Series 10: T2 · coronal · 5.0mm · 0.39mm/px · 3 of 28 slices shown (2 of 2)]
[im 1/28]
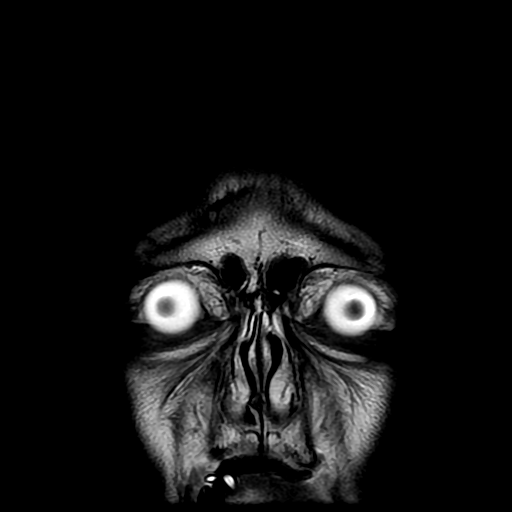
[im 14/28]
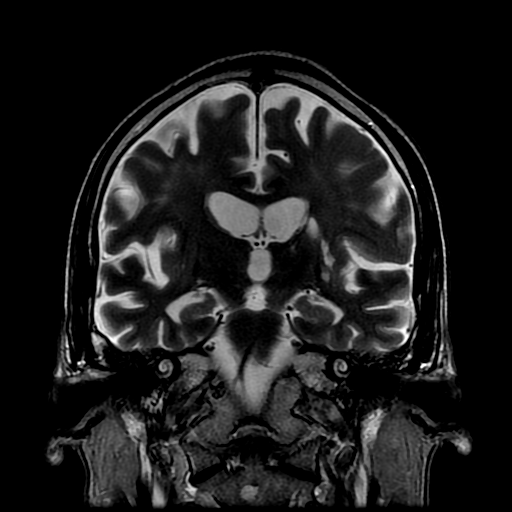
[im 28/28]
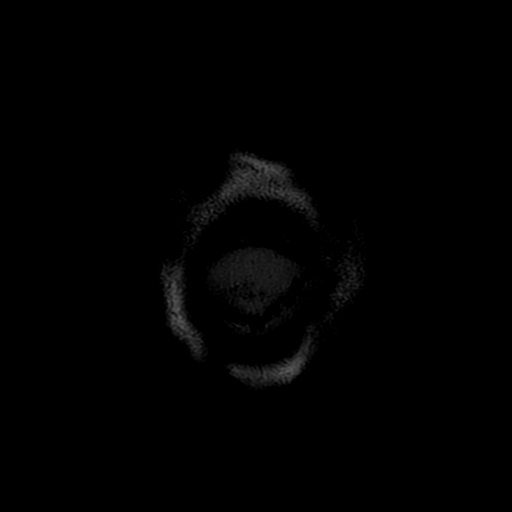

[Series 400: DWI · axial · 3.0mm · 1.09mm/px · z∈[-79,+62]mm · 5 of 49 slices shown (3 of 4)]
[im 1/49]
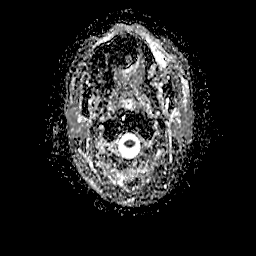
[im 13/49]
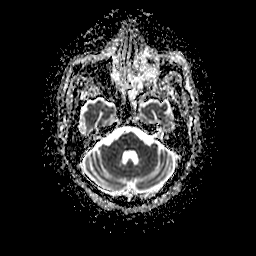
[im 25/49]
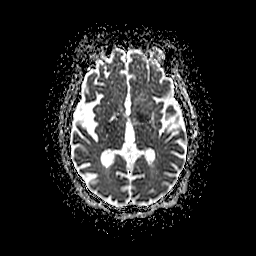
[im 37/49]
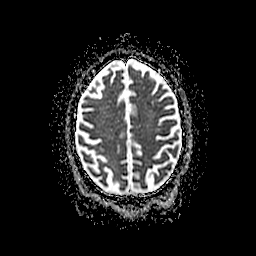
[im 49/49]
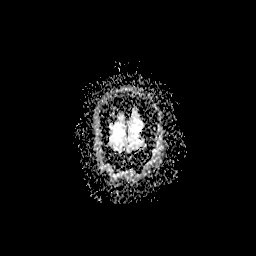

[Series 700: DWI · coronal · 5.0mm · 1.09mm/px · 4 of 36 slices shown (4 of 4)]
[im 1/36]
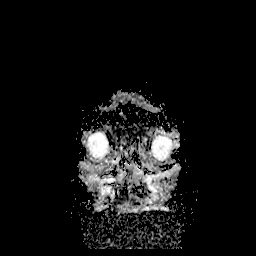
[im 12/36]
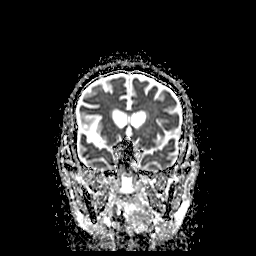
[im 24/36]
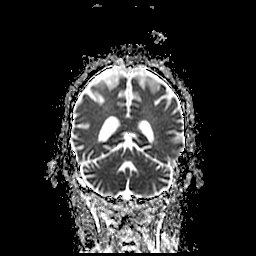
[im 36/36]
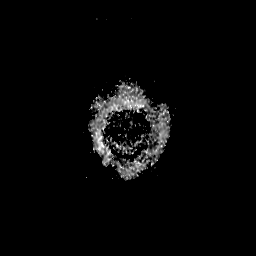

[34 of 48 positions shown; findings below may reference images not displayed]

FINDINGS: Diffuse prominence of the CSF containing spaces is compatible with
generalized cerebral atrophy. Patchy and confluent T2/FLAIR
hyperintensity within the periventricular and deep white matter both
cerebral hemispheres most consistent with chronic small vessel
ischemic disease. Remote lacunar infarcts present within the left
basal ganglia/corona radiata. Additional remote infarct involving
the left thalamus extending towards the left cerebral peduncle.
Remote lacunar infarct present within the right basal ganglia as
well.

There is new/worsened diffusion abnormality involving the left basal
ganglia, primarily involving the left caudate head and anterior limb
of the left internal capsule (series 4, image 25). Differential
considerations include recurrent ischemia within this region,
although this would be subacute in nature given the associated
T2/FLAIR signal abnormality and relatively normal ADC signal.
Possible toxic metabolic derangement could also conceivably be
considered. Overall, the signal changes are similar relative to
previous MRI from 03/24/2016.

Few scattered patchy multi focal foci of restricted diffusion are
present within the left MCA territory, primarily involving the left
frontal operculum and insular region, with extension towards the
left basal ganglia. These are predominantly subcentimeter in nature.
No associated hemorrhage or mass effect. Punctate focus of
restricted diffusion within the right caudate had also suspicious
for possible small infarct (series 4, image 27).

No other evidence for acute ischemia. Gray-white matter
differentiation otherwise largely maintained. Major intracranial
vascular flow voids are preserved. No acute intracranial hemorrhage.
Small amount of chronic blood products at the left basal ganglia.

Similar FLAIR signal abnormality at the right frontal and parietal
cortex. Again noted is subtle laminar necrosis within the right
parieto-occipital region. The

No mass lesion, midline shift, or mass effect. No hydrocephalus.
Layering debris again noted within the occipital horns bilaterally,
similar to prior, and likely related to history of prior meningitis.
No extra-axial fluid collection. Major dural sinuses are grossly
patent.

Craniocervical junction normal. Visualized upper cervical spine
within normal limits.

Pituitary gland normal. No acute abnormality about the globes and
orbits.

Mucosal thickening with fluid level present within the left
maxillary sinus. Scattered opacity mucosal thickening within the
remainder of the paranasal sinuses. Fluid within the posterior
nasopharynx. Bilateral mastoid effusions. Patient likely intubated.

Bone marrow signal intensity within normal limits. No scalp soft
tissue abnormality.
IMPRESSION: 1. Patchy small volume multi focal acute ischemic left MCA territory
infarcts, primarily involving the left frontal operculum and insular
region. Possible additional tiny punctate infarct within the right
caudate head.
2. Recurrent diffusion and T2/FLAIR signal abnormality within the
left basal ganglia. This is worsened relative to most recent MRI
from 06/21/2016, but similar relative to prior MRI from 03/24/2016.
Differential considerations include recurrent subacute ischemia
within this region. Possible toxic metabolic derangement could also
be considered.
3. Otherwise relatively stable appearance of the brain as compared
to recent MRI from 06/21/2016.

## 2017-12-05 IMAGING — CR DG CHEST 1V PORT
1 series · 1 of 1 positions shown · non-contrast
Comparison: 07/14/2016

CLINICAL DATA: Intubated patient. Respiratory failure. Followup
exam.

EXAM:
PORTABLE CHEST 1 VIEW

[AP]
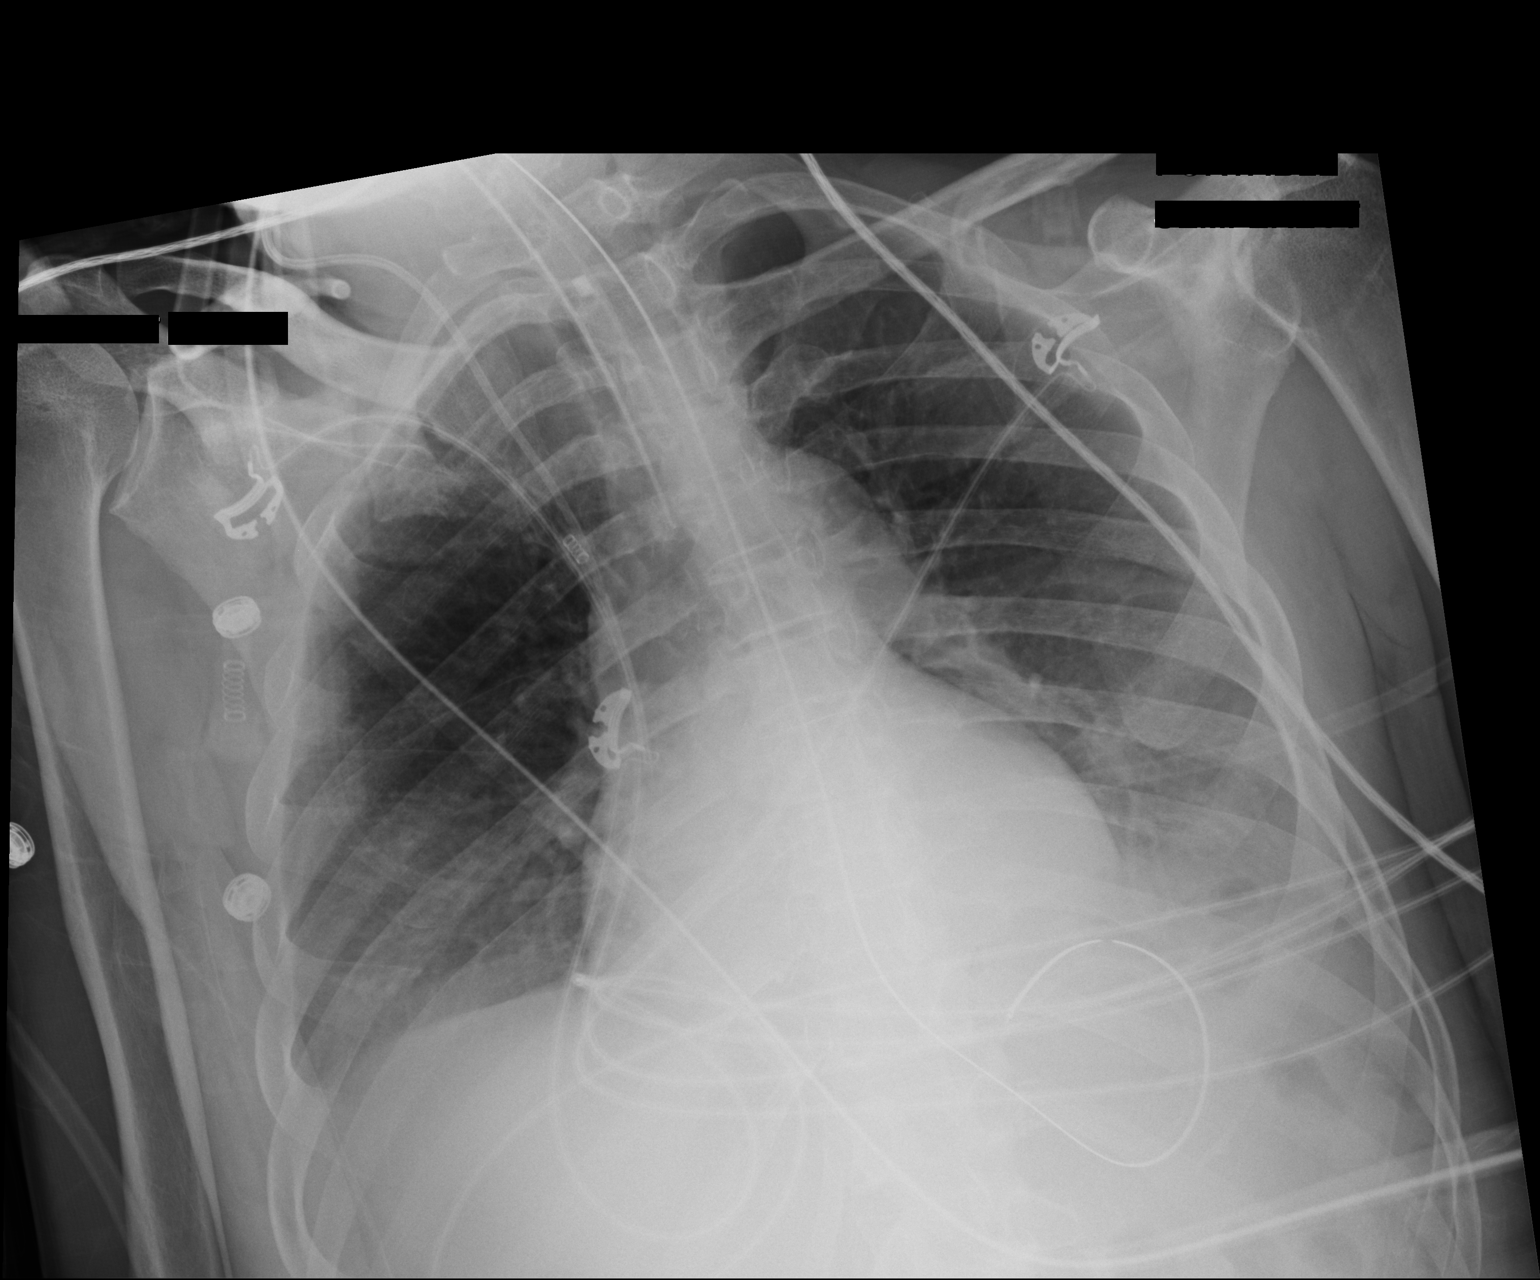

[1 of 1 positions shown; findings below may reference images not displayed]

FINDINGS: Opacity at the left lung base, obscuring the left hemidiaphragm,
appears mildly improved. This is consistent with a combination of
left pleural fluid with either pneumonia, atelectasis or a
combination. There is new hazy right lung base opacity consistent
with a small effusion with atelectasis. Remainder of the lungs is
clear. No pulmonary edema.

Nasal/ orogastric tube appears further inserted now curling within
stomach.

Endotracheal tube and right internal jugular central venous line are
stable and well positioned.
IMPRESSION: 1. Lung aeration at the left base appears mildly improved. There is
new mild right lung base hazy opacity consistent with a small
effusion and atelectasis.
2. No pulmonary edema or pneumothorax.
3. Support apparatus is well positioned.

## 2017-12-09 IMAGING — CR DG CHEST 1V PORT
1 series · 1 of 1 positions shown · non-contrast
Comparison: Chest radiograph dated 07/18/2016.

CLINICAL DATA: 49 y/o  M; respiratory failure.

EXAM:
PORTABLE CHEST 1 VIEW

[AP]
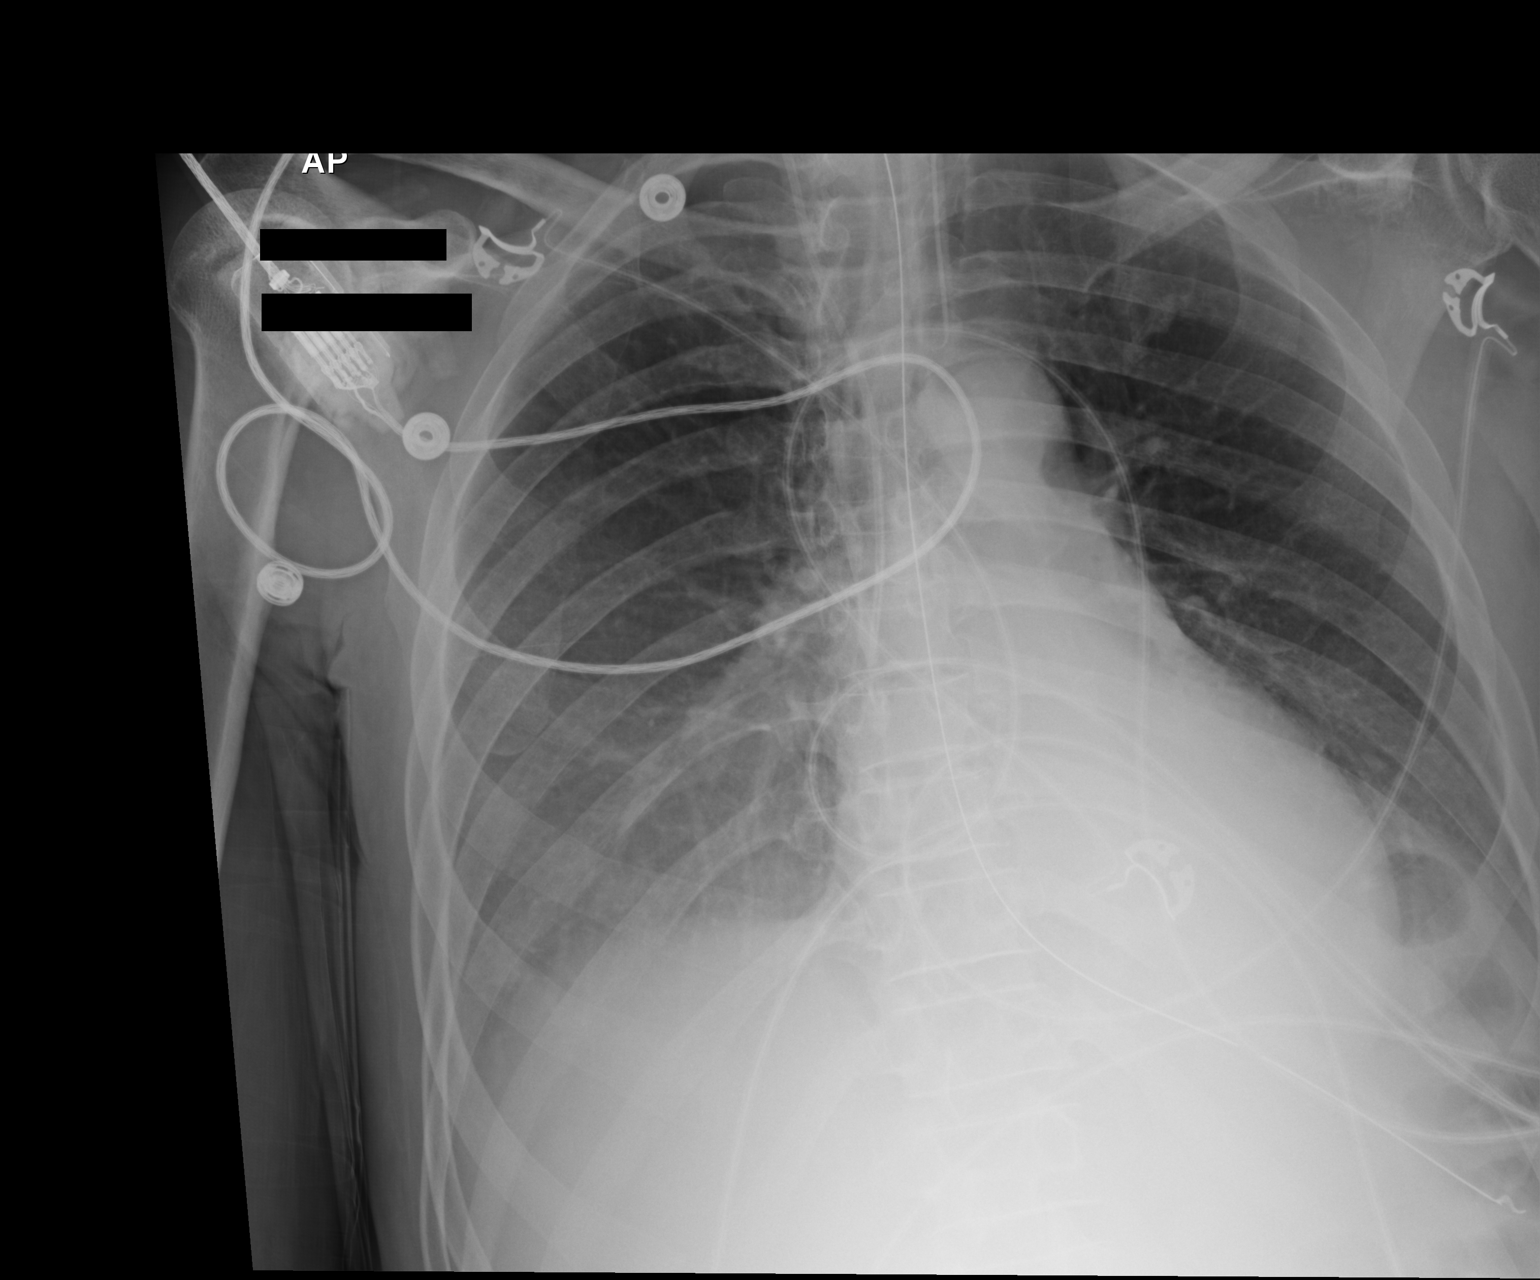

[1 of 1 positions shown; findings below may reference images not displayed]

FINDINGS: Enteric tube tip projects over the mid abdomen. Endotracheal tube
tip is 4.5 cm from the carina. Right central venous catheter tip
projects over the lower SVC. Patient is rotated. Bibasilar opacities
may represent atelectasis, effusion, and/or pneumonia and are
stable.
IMPRESSION: Stable bibasilar opacities which may represent atelectasis,
effusions, and/or pneumonia. Stable lines and tubes.

By: Jovan Mira Nebotno M.D.

## 2017-12-10 IMAGING — CR DG ABD PORTABLE 1V
1 series · 1 of 1 positions shown · non-contrast
Comparison: None.

CLINICAL DATA: Feeding tube placement

EXAM:
PORTABLE ABDOMEN - 1 VIEW

[AP]
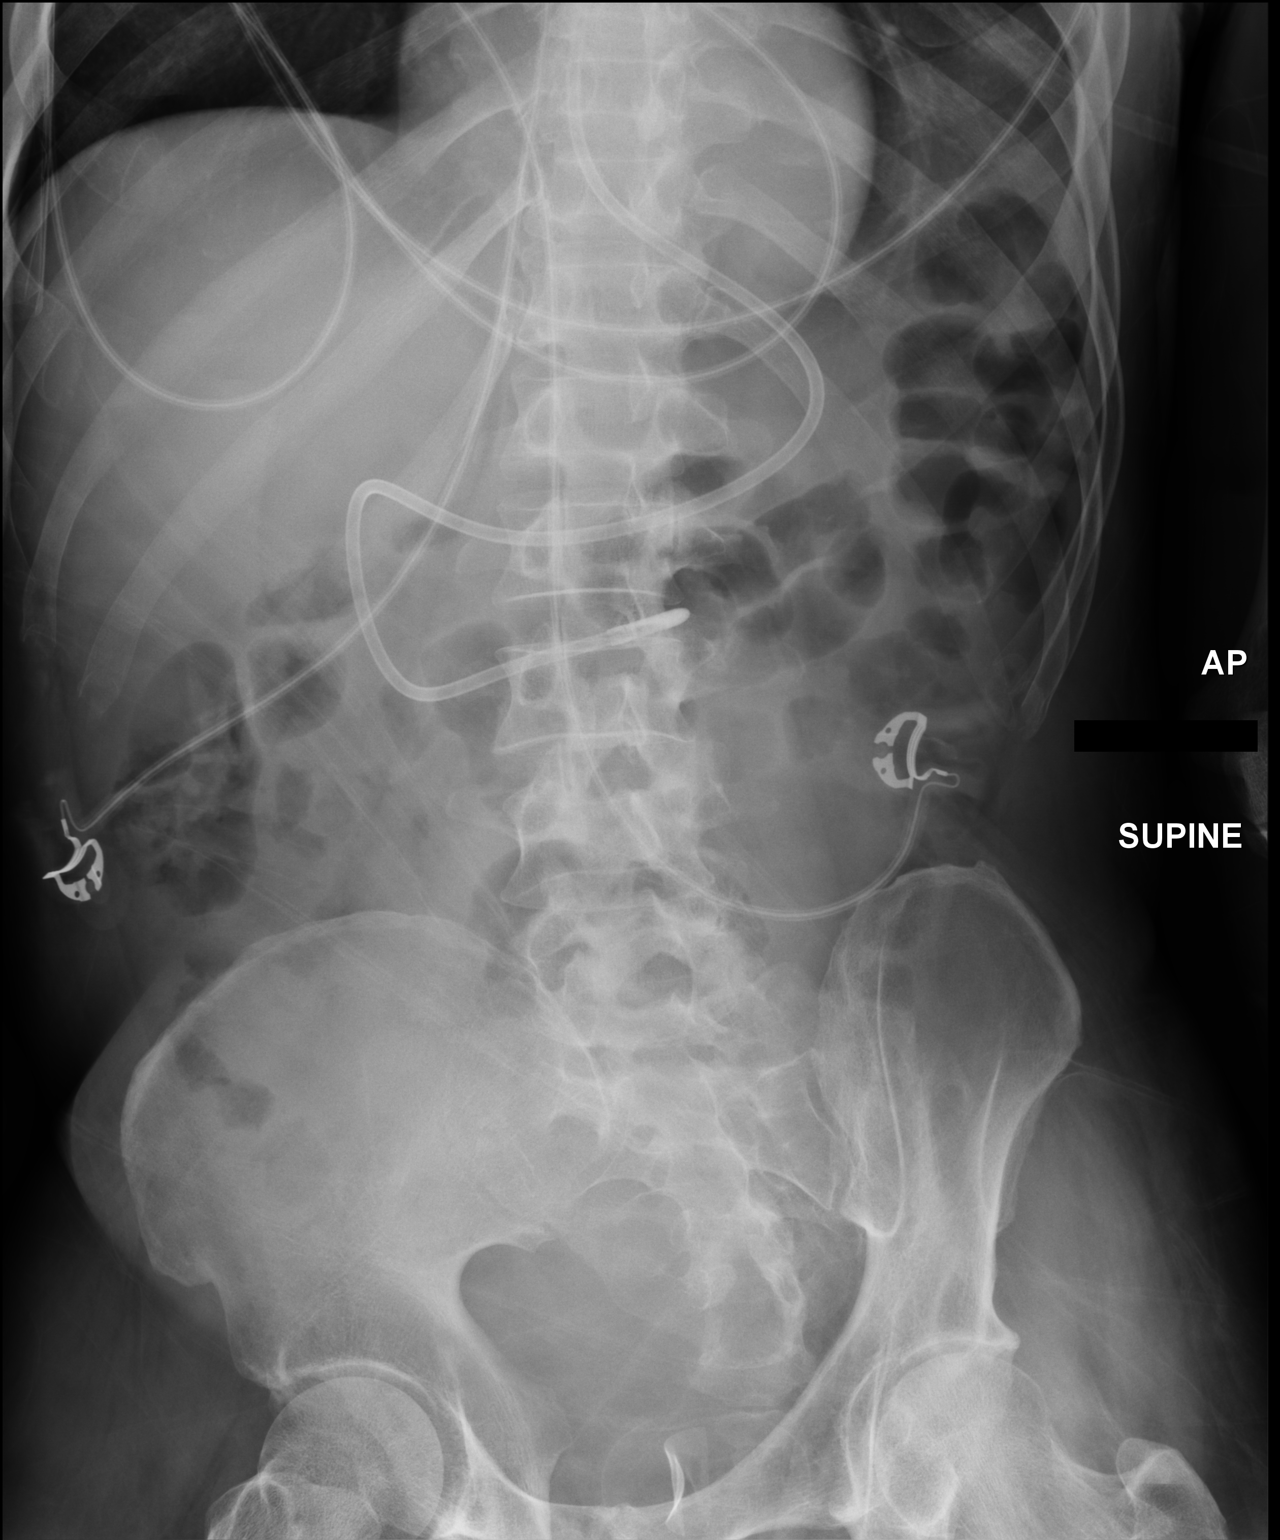

[1 of 1 positions shown; findings below may reference images not displayed]

FINDINGS: Weighted feeding tube terminates in the distal 4th portion of the
duodenum near the ligament of Treitz.

Operative bowel gas pattern.

Visualized osseous structures are within normal limits.
IMPRESSION: Weighted feeding tube terminates near the ligament of Treitz.

## 2017-12-10 IMAGING — CR DG CHEST 1V PORT
1 series · 1 of 1 positions shown · non-contrast
Comparison: July 21, 2016

CLINICAL DATA: Tracheostomy placement

EXAM:
PORTABLE CHEST 1 VIEW

[AP]
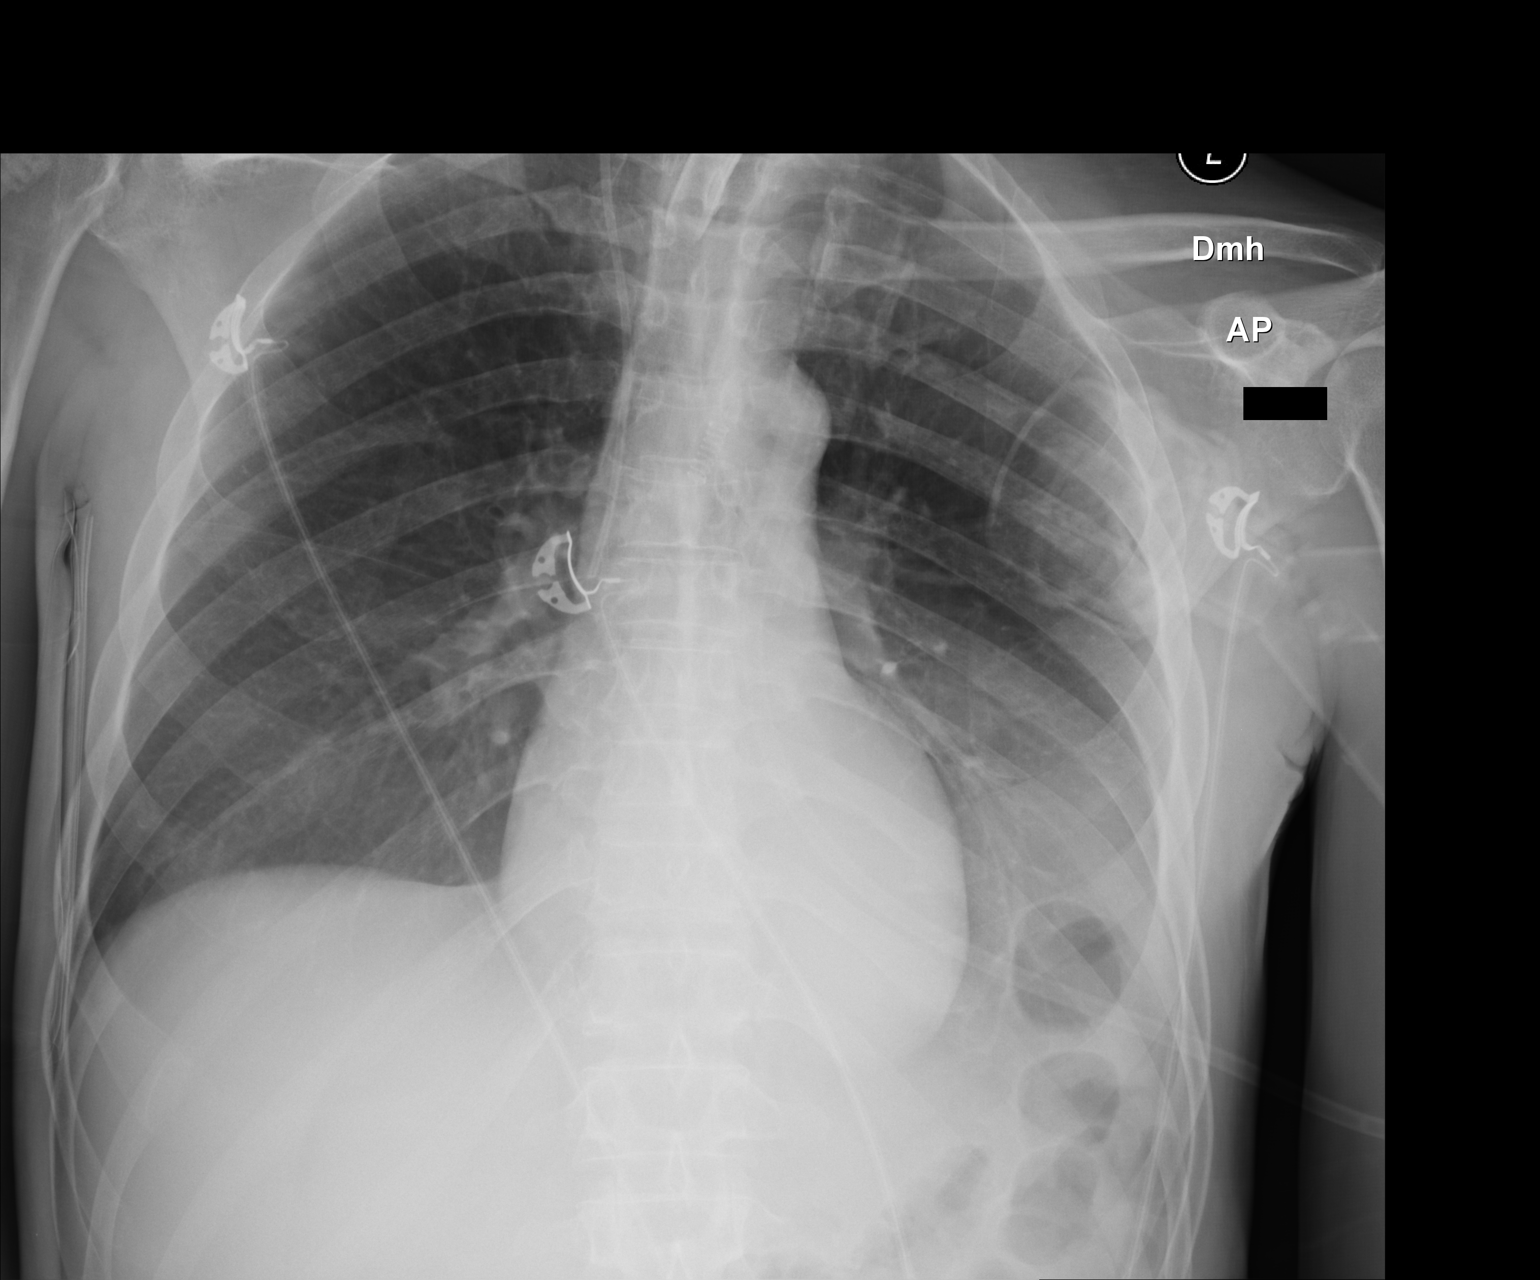

[1 of 1 positions shown; findings below may reference images not displayed]

FINDINGS: A tracheostomy is now present with the tip 5.2 cm above the carina.
Central catheter tip is in the superior vena cava. No pneumothorax.
There is left lower lobe consolidation with left effusion. There is
a smaller right effusion. Right lung is otherwise clear. Heart size
and pulmonary vascularity are normal. No adenopathy.
IMPRESSION: Tracheostomy tip 5.2 cm above the carina. Central catheter tip in
superior vena cava. No pneumothorax. Persistent left lower lobe
consolidation with left effusion. Small right effusion present.
Stable cardiac silhouette.

## 2017-12-14 IMAGING — DX DG ABD PORTABLE 1V
1 series · 1 of 1 positions shown · non-contrast
Comparison: Earlier today

CLINICAL DATA: Encounter for feeding tube placement.

EXAM:
PORTABLE ABDOMEN - 1 VIEW

[abdomen supine]
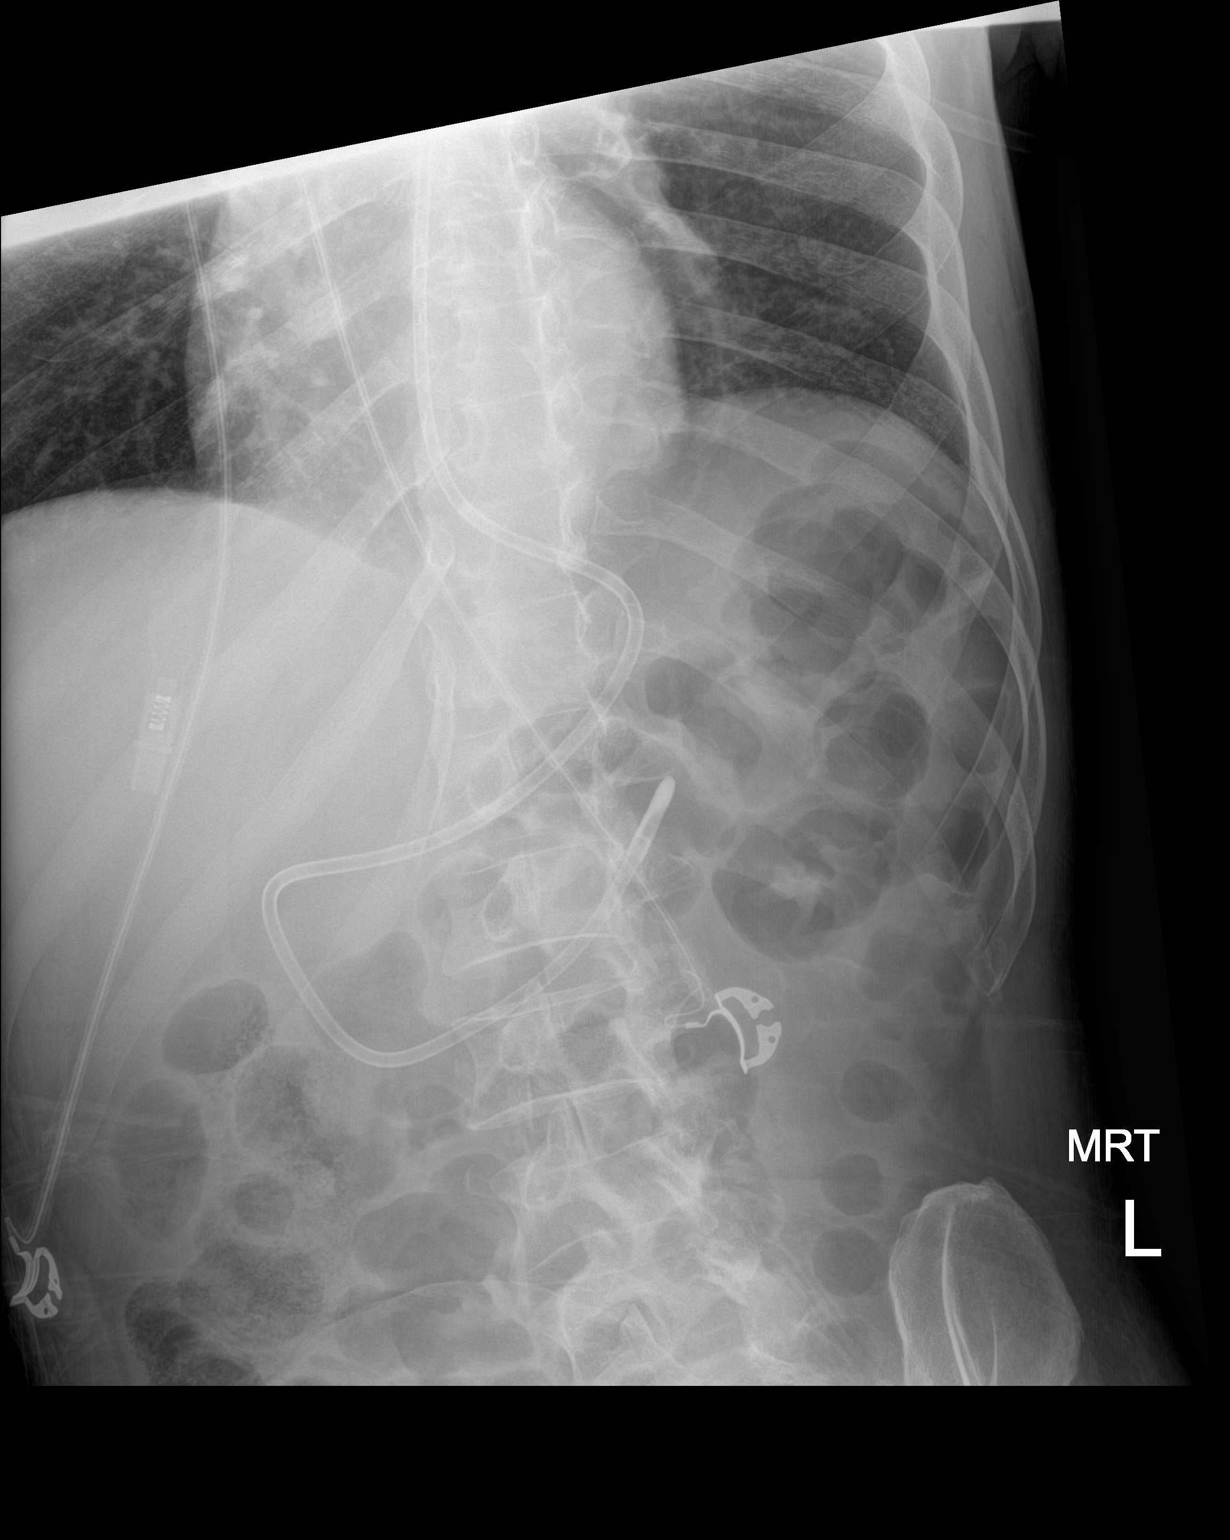

[1 of 1 positions shown; findings below may reference images not displayed]

FINDINGS: Advanced feeding tube with tip just before the ligament of Treitz.
Nonobstructive bowel gas pattern. Visualized lung bases are clear.
IMPRESSION: Advanced feeding tube with tip in the distal duodenum.

## 2017-12-14 IMAGING — CR DG ABD PORTABLE 1V
1 series · 1 of 1 positions shown · non-contrast
Comparison: 07/22/2016.

CLINICAL DATA: Feeding tube placement.

EXAM:
PORTABLE ABDOMEN - 1 VIEW

[AP]
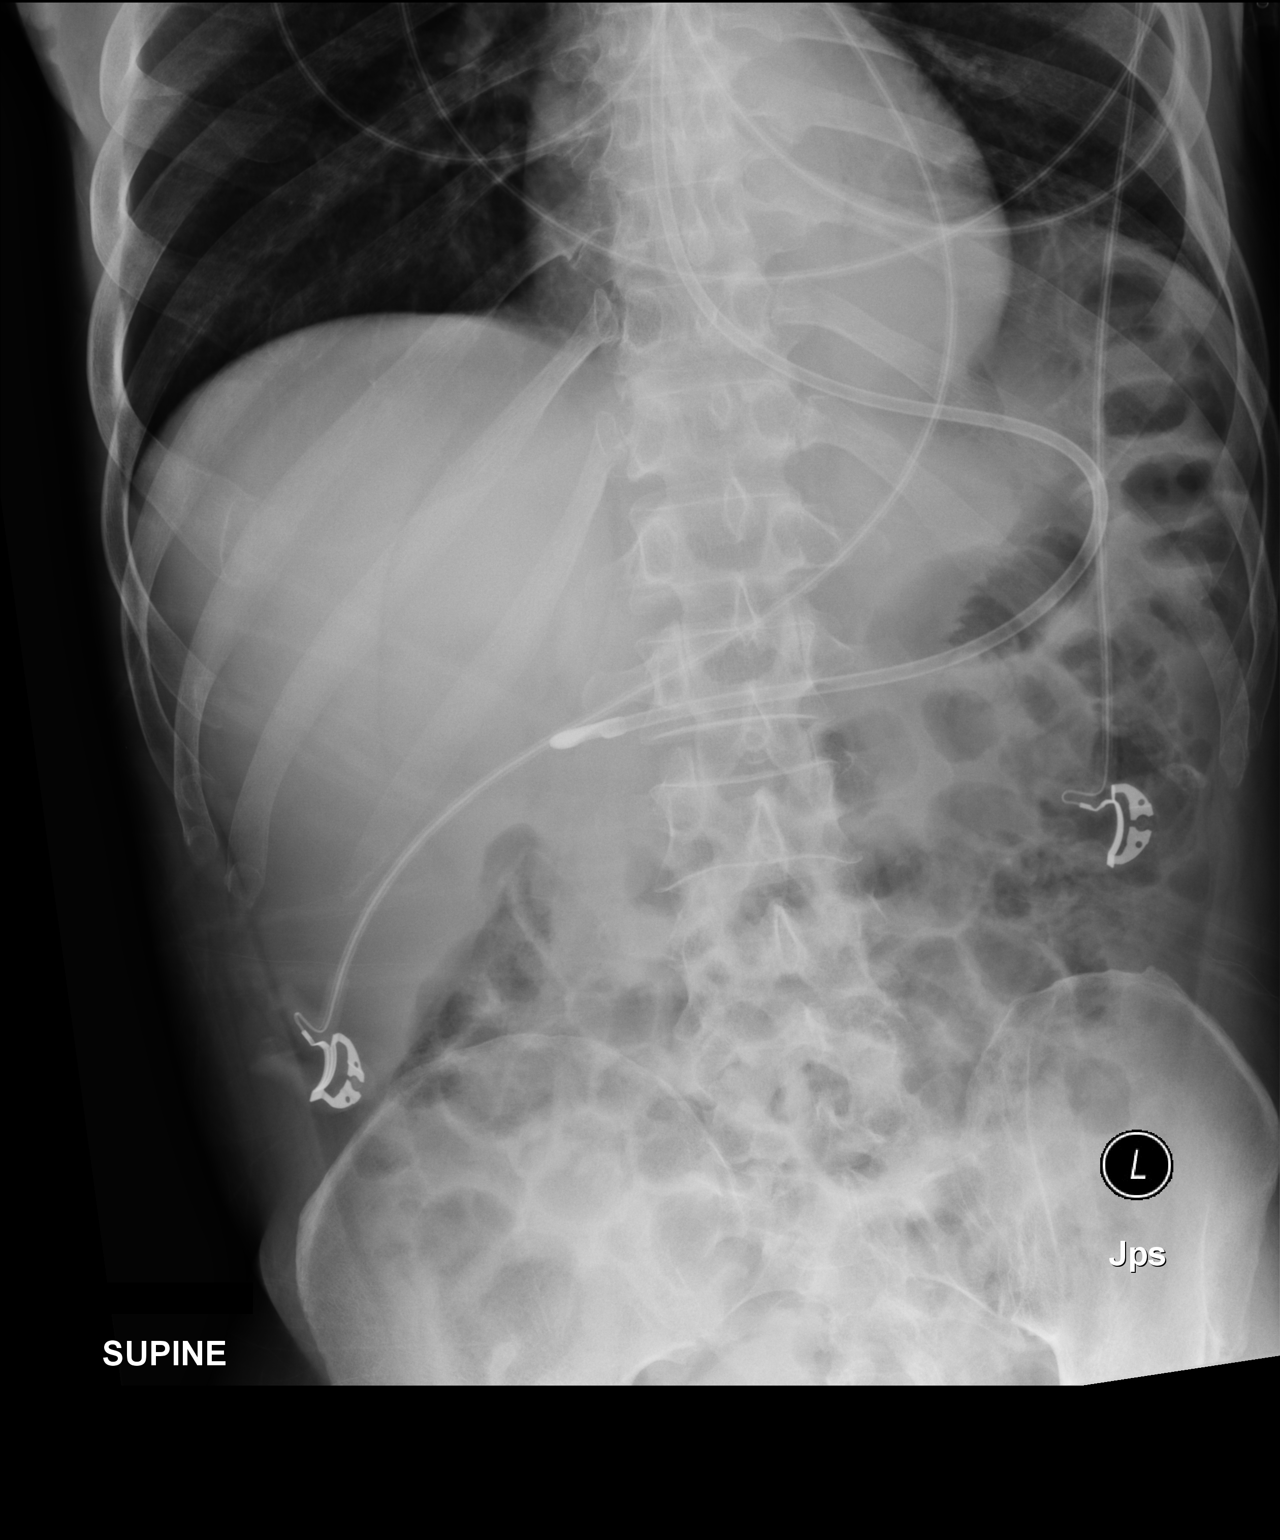

[1 of 1 positions shown; findings below may reference images not displayed]

FINDINGS: Soft feeding tube has its tip at the gastric antrum. Gas pattern is
unremarkable.
IMPRESSION: Feeding tube tip at the gastric antrum.

## 2017-12-15 IMAGING — DX DG CHEST 1V PORT
1 series · 1 of 1 positions shown · non-contrast
Comparison: 07/22/2016

CLINICAL DATA: Check tracheostomy tube

EXAM:
PORTABLE CHEST 1 VIEW

[chest ap]
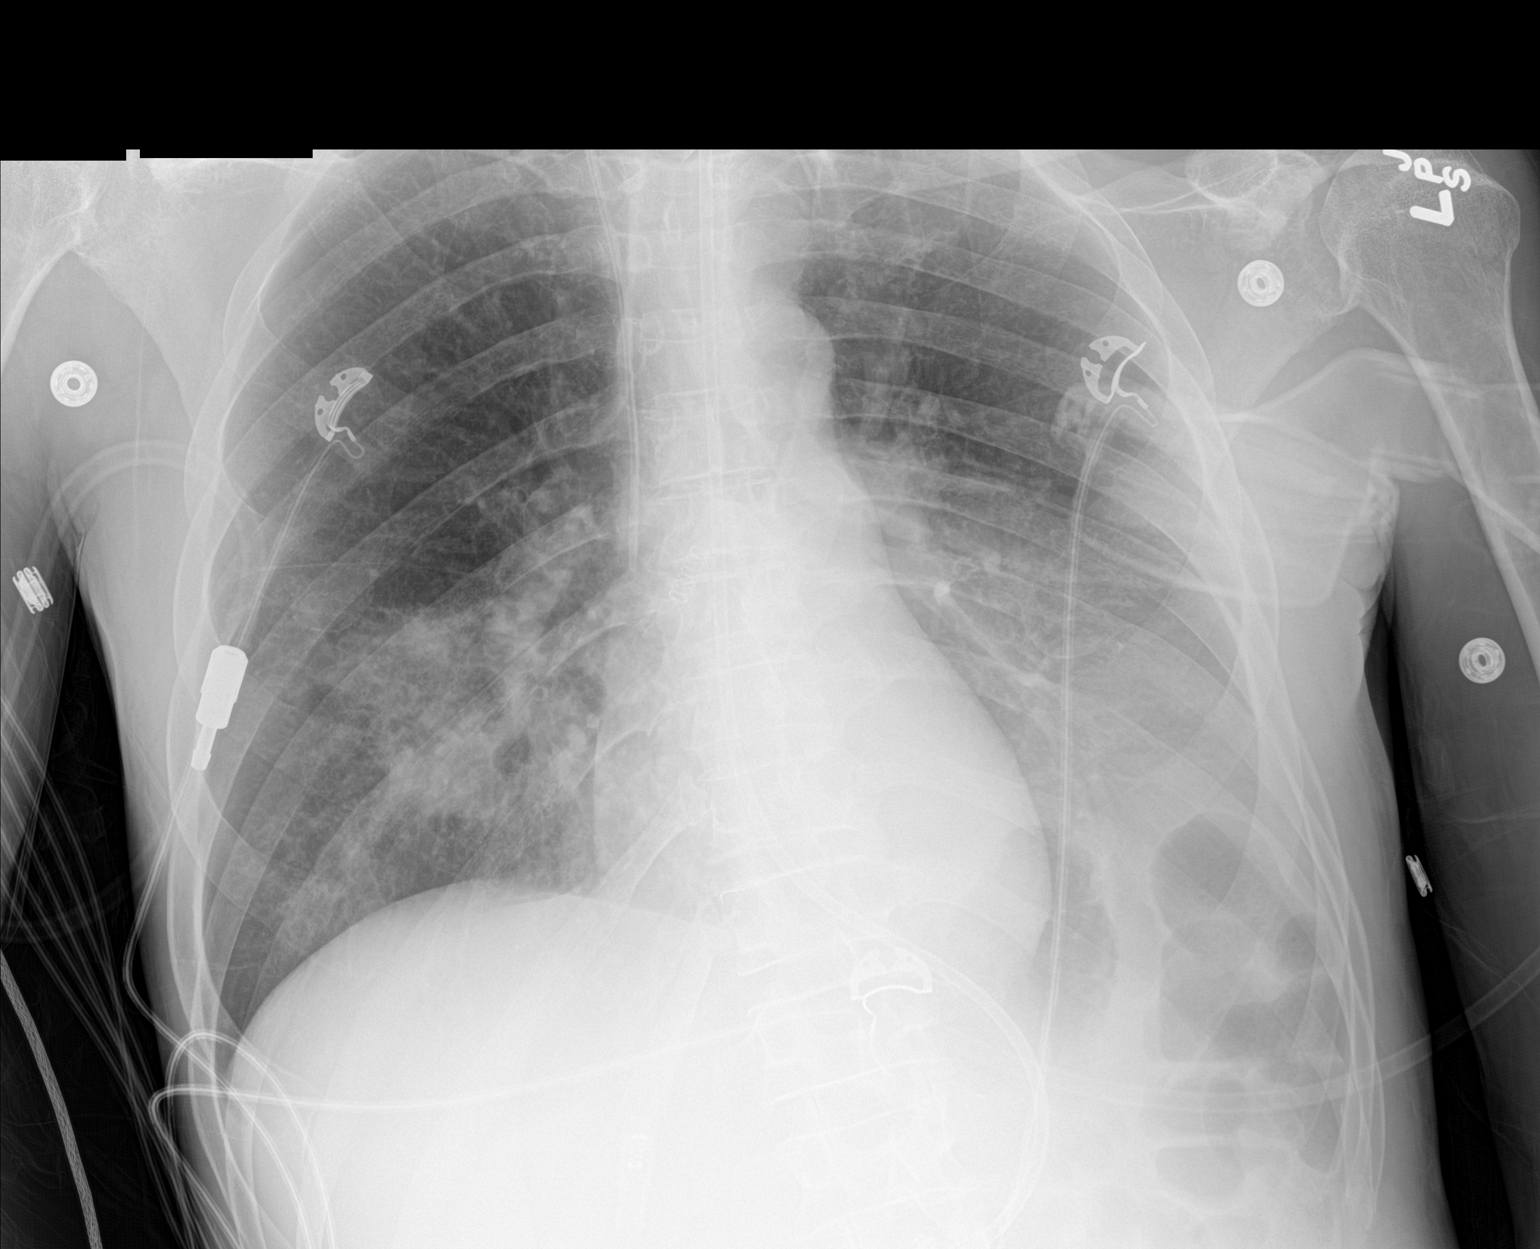

[1 of 1 positions shown; findings below may reference images not displayed]

FINDINGS: Cardiac shadow is within normal limits. A tracheostomy tube, right
jugular central line and feeding catheter are seen in satisfactory
position. Patchy bilateral infiltrates are noted in the bases
increased from the prior exam. Left pleural effusion is noted as
well.
IMPRESSION: Patchy basilar infiltrates and left pleural effusion increased from
the prior exam.

## 2017-12-16 IMAGING — CR DG CHEST 1V PORT
1 series · 1 of 1 positions shown · non-contrast
Comparison: 07/27/2016

CLINICAL DATA: Dyspnea.

EXAM:
PORTABLE CHEST 1 VIEW

[AP]
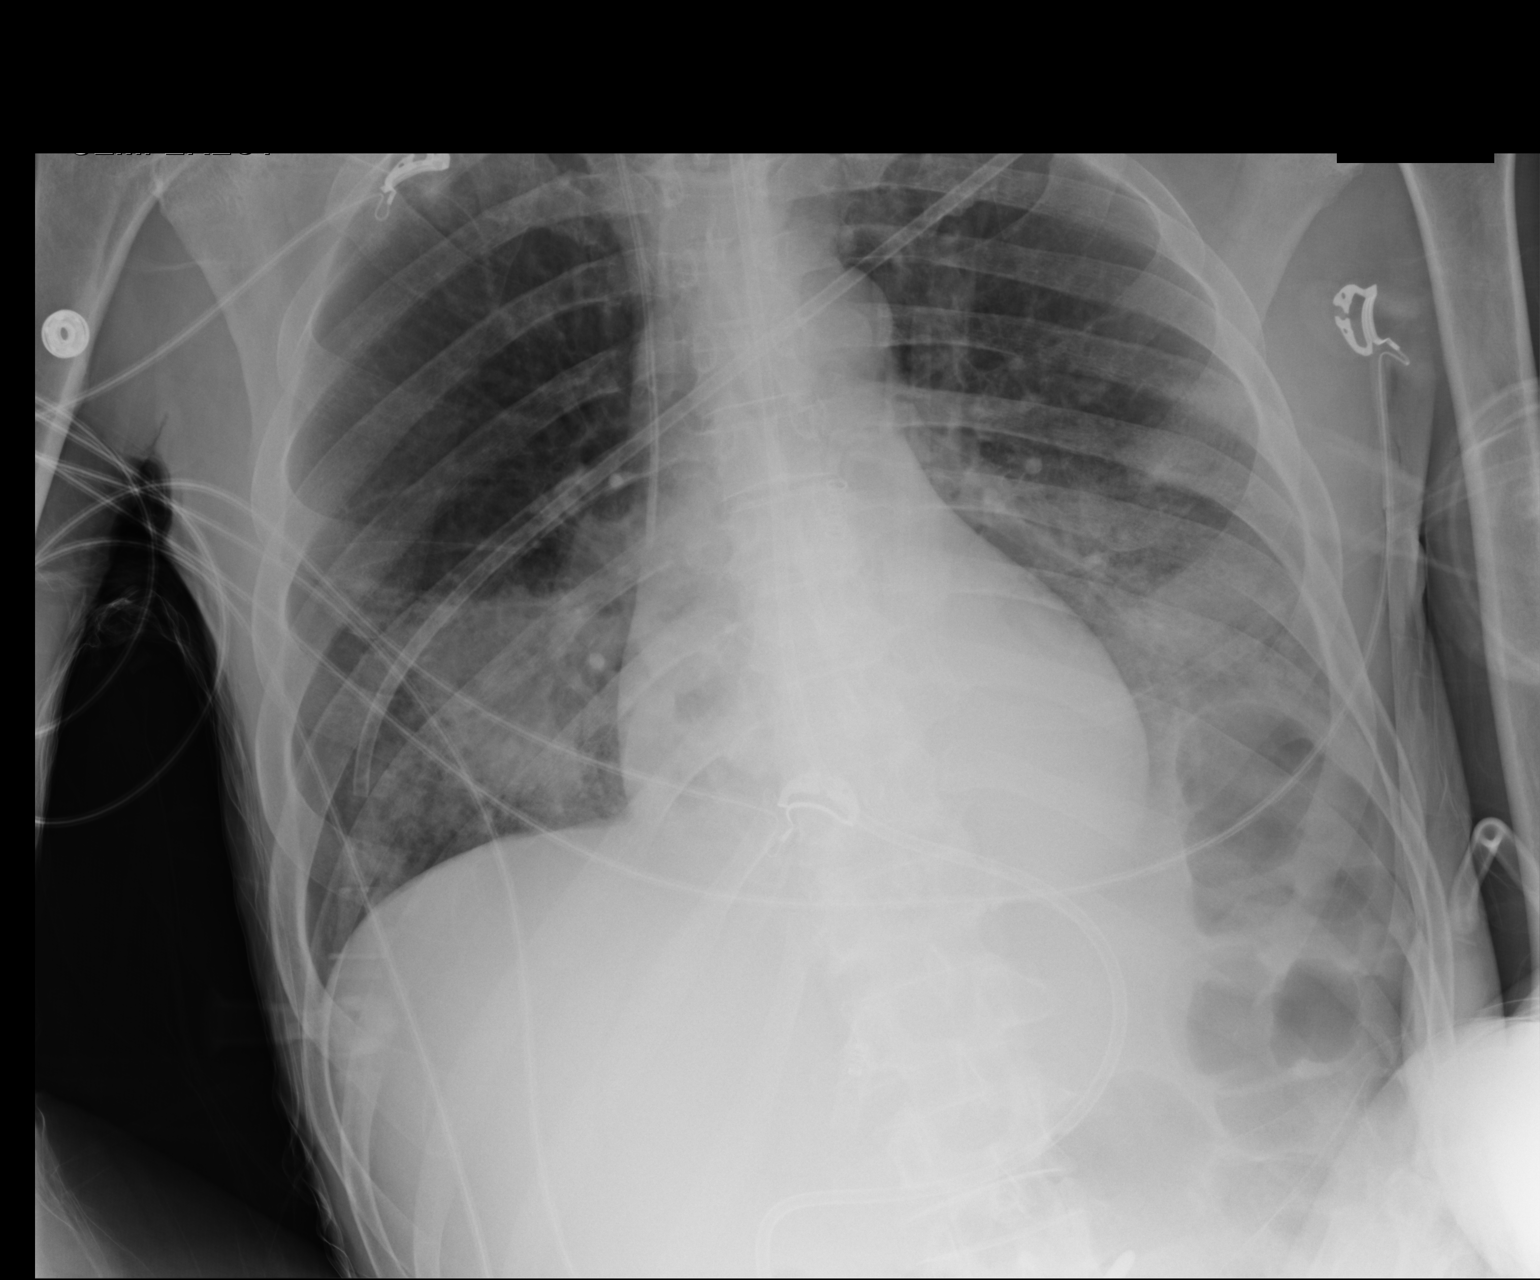

[1 of 1 positions shown; findings below may reference images not displayed]

FINDINGS: Right IJ approach central venous catheter, feeding catheter and
tracheostomy tube in stable position.

Cardiomediastinal silhouette is normal. Mediastinal contours appear
intact.

There is no evidence of pneumothorax. There is persistent right
middle lobe and left lower lobe airspace consolidation with volume
loss in the left lung base. Small bilateral pleural effusions cannot
be excluded.

Osseous structures are without acute abnormality. Soft tissues are
grossly normal.
IMPRESSION: Persistent bilateral patchy airspace consolidation with evidence of
volume loss and the left lower lobe.

Small bilateral pleural effusions cannot be excluded.

Stable support apparatus.

## 2017-12-20 IMAGING — XA IR PERC PLACEMENT GASTROSTOMY
2 series · 2 of 2 positions shown · non-contrast
Comparison: none

CLINICAL DATA: Respiratory failure and aspiration pneumonia. The
patient requires percutaneous gastrostomy for long-term nutritional
needs.

[Series 1: fl (-) angio · 1 of 1 slices shown (1 of 2)]
[im 1/1]
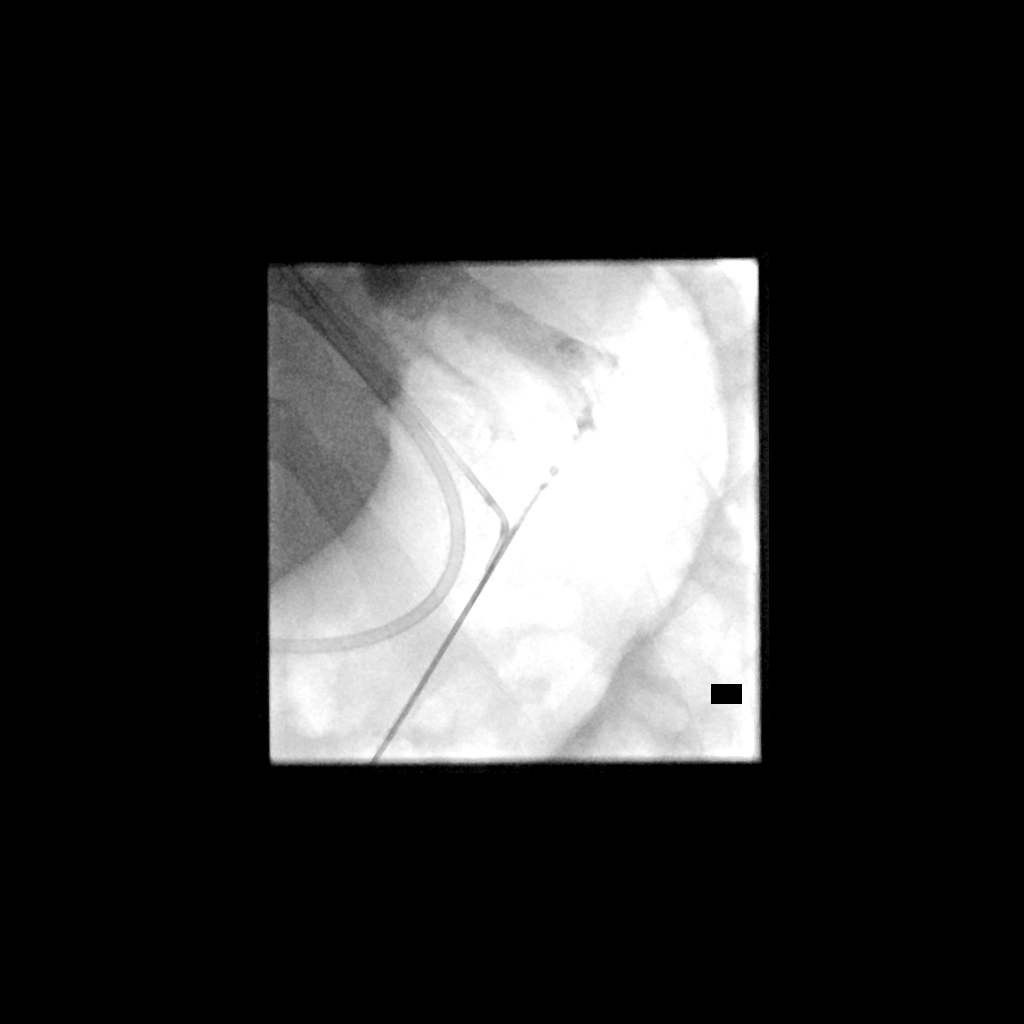

[Series 2: fl (-) angio · 1 of 1 slices shown (2 of 2)]
[im 1/1]
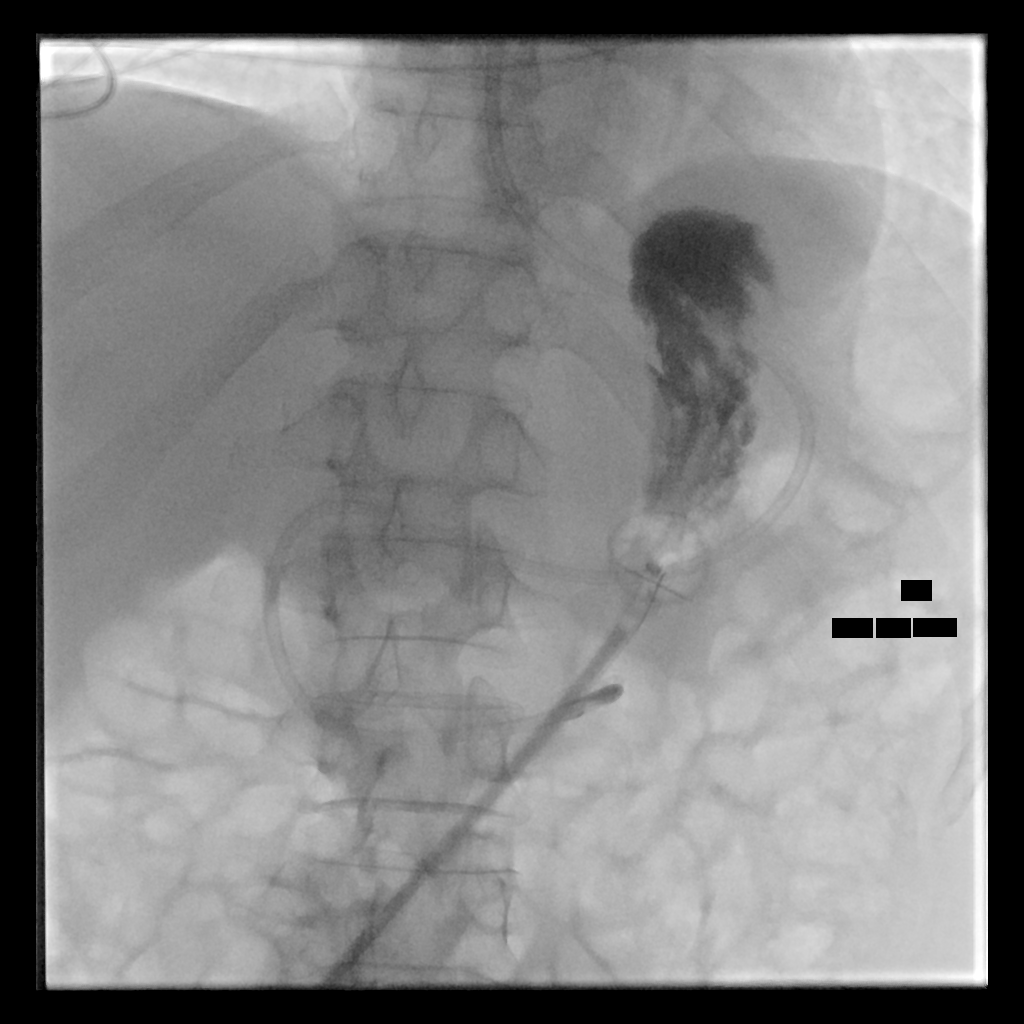

[2 of 2 positions shown; findings below may reference images not displayed]

EXAM:
PERCUTANEOUS GASTROSTOMY TUBE PLACEMENT

ANESTHESIA/SEDATION:
1.0 mg IV Versed; 50 mcg IV Fentanyl.

Total Moderate Sedation Time

6 minutes.

The patient's level of consciousness and physiologic status were
continuously monitored during the procedure by Radiology nursing.

CONTRAST:  20 mL Esovue-5WW injected into the stomach.

MEDICATIONS:
A scheduled dose of IV cefepime was administered approximately 1
hour prior to the procedure.

FLUOROSCOPY TIME:  1 minute and 42 seconds.

PROCEDURE:
The procedure, risks, benefits, and alternatives were explained to
the patient's wife. Questions regarding the procedure were
encouraged and answered. The patient's wife understands and consents
to the procedure. A time-out was performed prior to the procedure.

A 5-French catheter was then advanced through the the patient's
mouth under fluoroscopy into the esophagus and to the level of the
stomach. This catheter was used to insufflate the stomach with air
under fluoroscopy.

The abdominal wall was prepped with Betadine in a sterile fashion,
and a sterile drape was applied covering the operative field. A
sterile gown and sterile gloves were used for the procedure. Local
anesthesia was provided with 1% Lidocaine.

A skin incision was made in the upper abdominal wall. Under
fluoroscopy, an 18 gauge trocar needle was advanced into the
stomach. Contrast injection was performed to confirm intraluminal
position of the needle tip. A single T tack was then deployed in the
lumen of the stomach. This was brought up to tension at the skin
surface.

Over a guidewire, a 9-French sheath was advanced into the lumen of
the stomach. The wire was left in place as a safety wire. A loop
snare device from a percutaneous gastrostomy kit was then advanced
into the stomach.

A floppy guide wire was advanced through the orogastric catheter
under fluoroscopy in the stomach. The loop snare advanced through
the percutaneous gastric access was used to snare the guide wire.
This allowed withdrawal of the loop snare out of the patient's mouth
by retraction of the orogastric catheter and wire.

A 20-French bumper retention gastrostomy tube was looped around the
snare device. It was then pulled back through the patient's mouth.
The retention bumper was brought up to the anterior gastric wall.
The T tack suture was cut at the skin. The exiting gastrostomy tube
was cut to appropriate length and a feeding adapter applied. The
catheter was injected with contrast material to confirm position and
a fluoroscopic spot image saved. The tube was then flushed with
saline. A dressing was applied over the gastrostomy exit site.

COMPLICATIONS:
None.
FINDINGS: The stomach distended well with air allowing safe placement of the
gastrostomy tube. After placement, the tip of the gastrostomy tube
lies in the body of the stomach.
IMPRESSION: Percutaneous gastrostomy with placement of a 20-French bumper
retention tube in the body of the stomach. This tube can be used for
percutaneous feeds beginning in 24 hours after placement.

## 2017-12-21 IMAGING — MR MR HEAD W/O CM
5 series · 48 of 48 positions shown · non-contrast
Comparison: Previous brain MRI from 07/15/2016.

CLINICAL DATA: Initial evaluation for decreased responsiveness,
status post recent stroke. Question new or worsened stroke.

EXAM:
MRI HEAD WITHOUT CONTRAST
TECHNIQUE: Multiplanar, multiecho pulse sequences of the brain and surrounding
structures were obtained without intravenous contrast.

[Series 4: DWI · axial · 3.0mm · 1.09mm/px · z∈[-49,+90]mm · 15 of 96 slices shown (1 of 4)]
[im 1/96]
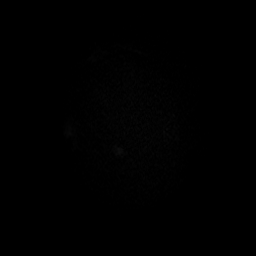
[im 7/96]
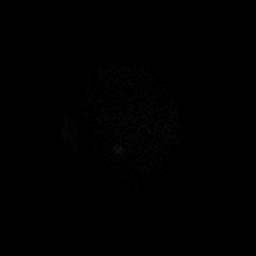
[im 14/96]
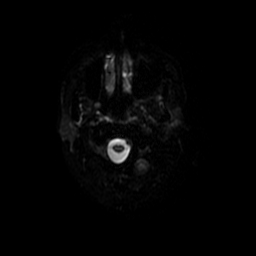
[im 21/96]
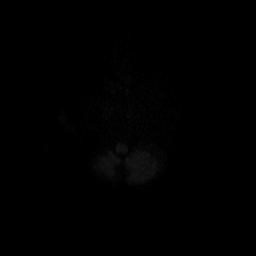
[im 28/96]
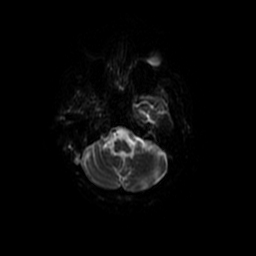
[im 34/96]
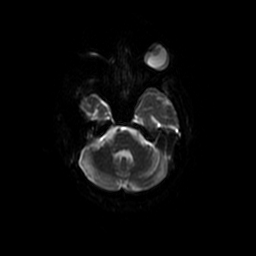
[im 41/96]
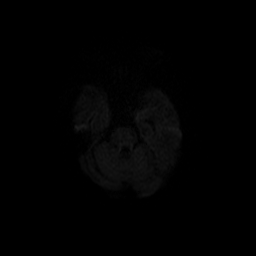
[im 48/96]
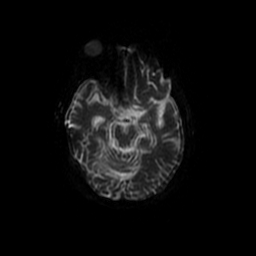
[im 55/96]
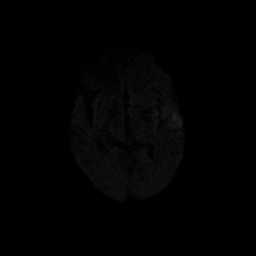
[im 62/96]
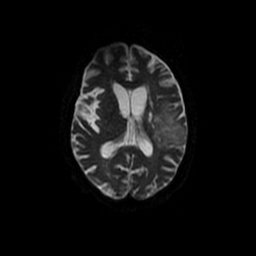
[im 68/96]
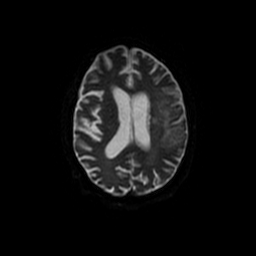
[im 75/96]
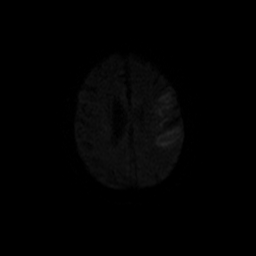
[im 82/96]
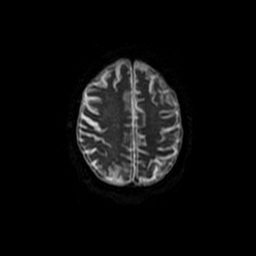
[im 89/96]
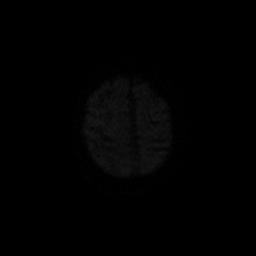
[im 96/96]
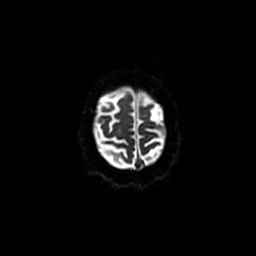

[Series 6: FLAIR · axial · 5.0mm · 1.02mm/px · z∈[-47,+89]mm · 4 of 24 slices shown]
[im 1/24]
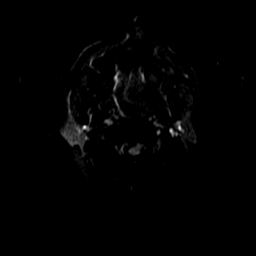
[im 8/24]
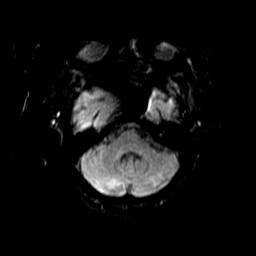
[im 16/24]
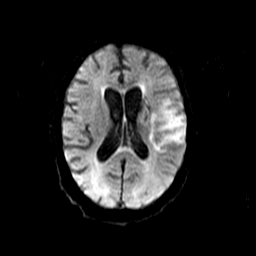
[im 24/24]
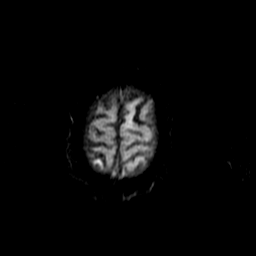

[Series 7: DWI · axial · 3.0mm · 1.09mm/px · z∈[-49,+90]mm · 15 of 96 slices shown (2 of 4)]
[im 1/96]
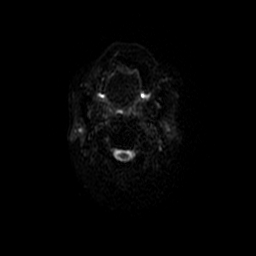
[im 7/96]
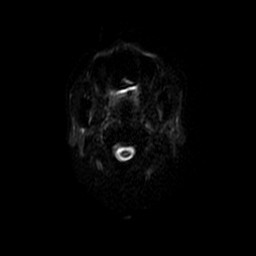
[im 14/96]
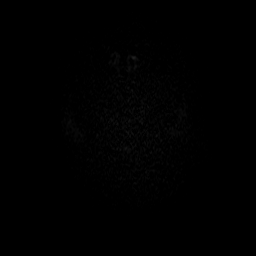
[im 21/96]
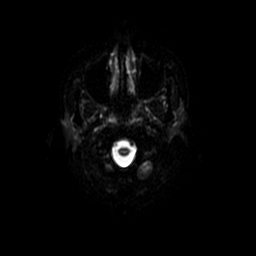
[im 28/96]
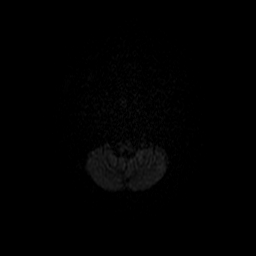
[im 34/96]
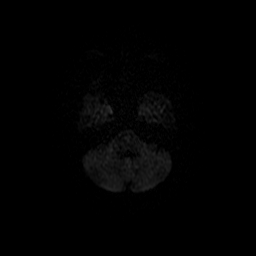
[im 41/96]
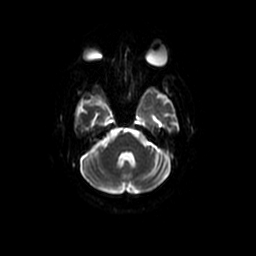
[im 48/96]
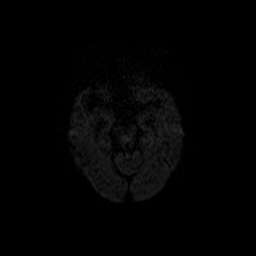
[im 55/96]
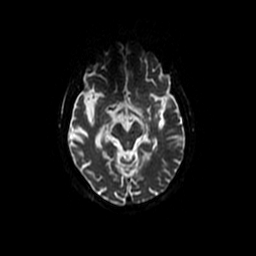
[im 62/96]
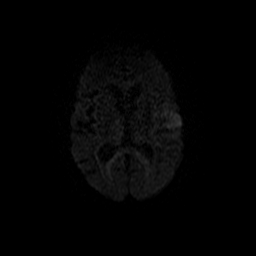
[im 68/96]
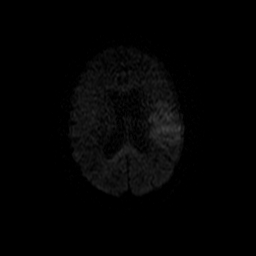
[im 75/96]
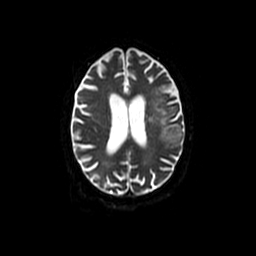
[im 82/96]
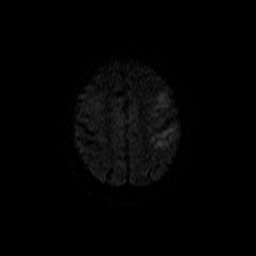
[im 89/96]
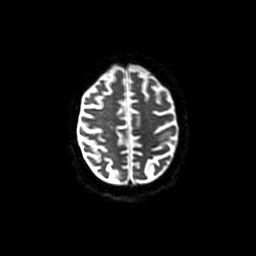
[im 96/96]
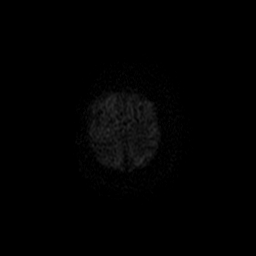

[Series 400: DWI · axial · 3.0mm · 1.09mm/px · z∈[-49,+90]mm · 7 of 48 slices shown (3 of 4)]
[im 1/48]
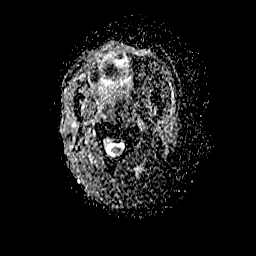
[im 8/48]
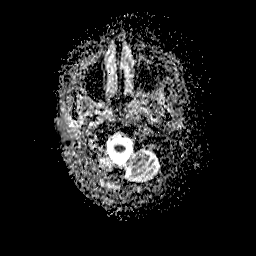
[im 16/48]
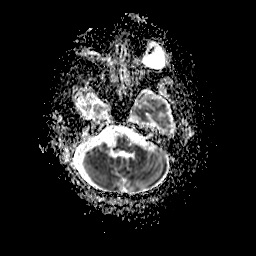
[im 24/48]
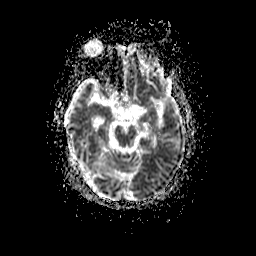
[im 32/48]
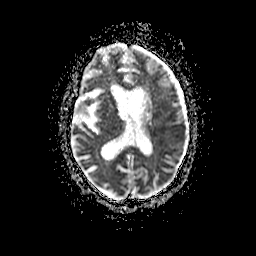
[im 40/48]
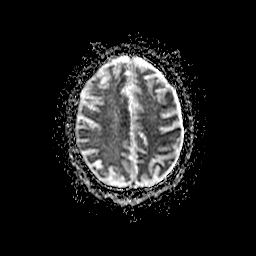
[im 48/48]
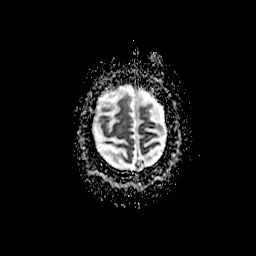

[Series 700: DWI · axial · 3.0mm · 1.09mm/px · z∈[-49,+90]mm · 7 of 48 slices shown (4 of 4)]
[im 1/48]
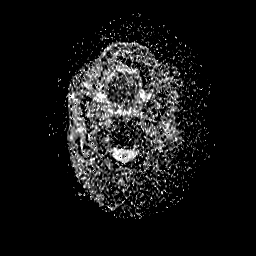
[im 8/48]
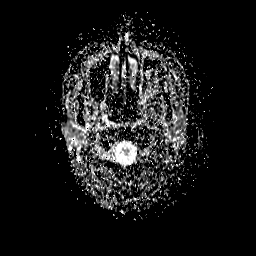
[im 16/48]
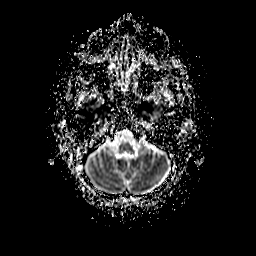
[im 24/48]
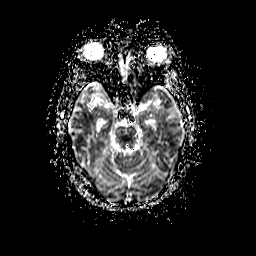
[im 32/48]
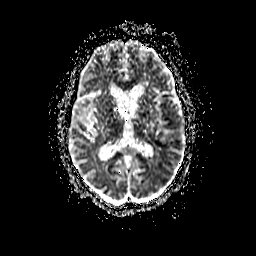
[im 40/48]
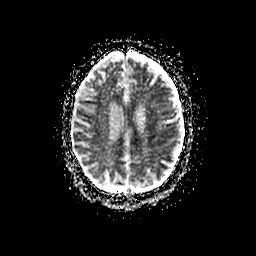
[im 48/48]
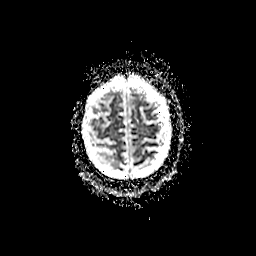

[48 of 48 positions shown; findings below may reference images not displayed]

FINDINGS: Limited DWI sequences were performed. Additionally, study is
somewhat degraded by motion artifact.

Axial DWI sequence demonstrates a moderate-size confluent diffusion
abnormality involving the left MCA territory, increased in size
relative to most recent MRI from 07/15/2016. Area of infarction
involves the left frontal lobe, including the operculum and insula,
with extension towards the the left temporal lobe. This is largely
subacute in appearance with normalization of associated ADC signal.
The No significant mass effect. Evaluation for associated hemorrhage
limited on this exam.

No other acute intracranial infarct. No other definite acute
intracranial process on this limited study. Remote watershed type
infarcts involving the right cerebral hemisphere again noted. Remote
lacunar infarcts involving the left basal ganglia noted. Chronic
microvascular ischemic disease grossly stable. No hydrocephalus or
midline shift. No extra-axial fluid collection.
IMPRESSION: 1. Moderate-sized confluent left MCA territory infarct, increased in
size relative to prior MRI from 07/15/2016. This is largely subacute
in appearance. Mild localized edema without significant mass effect.
The
2. No other definite acute intracranial process on this limited
study.

## 2017-12-29 IMAGING — CR DG CHEST 1V PORT
1 series · 1 of 1 positions shown · non-contrast
Comparison: Chest radiograph 07/28/2016

CLINICAL DATA: Respiratory failure

EXAM:
PORTABLE CHEST 1 VIEW

[AP]
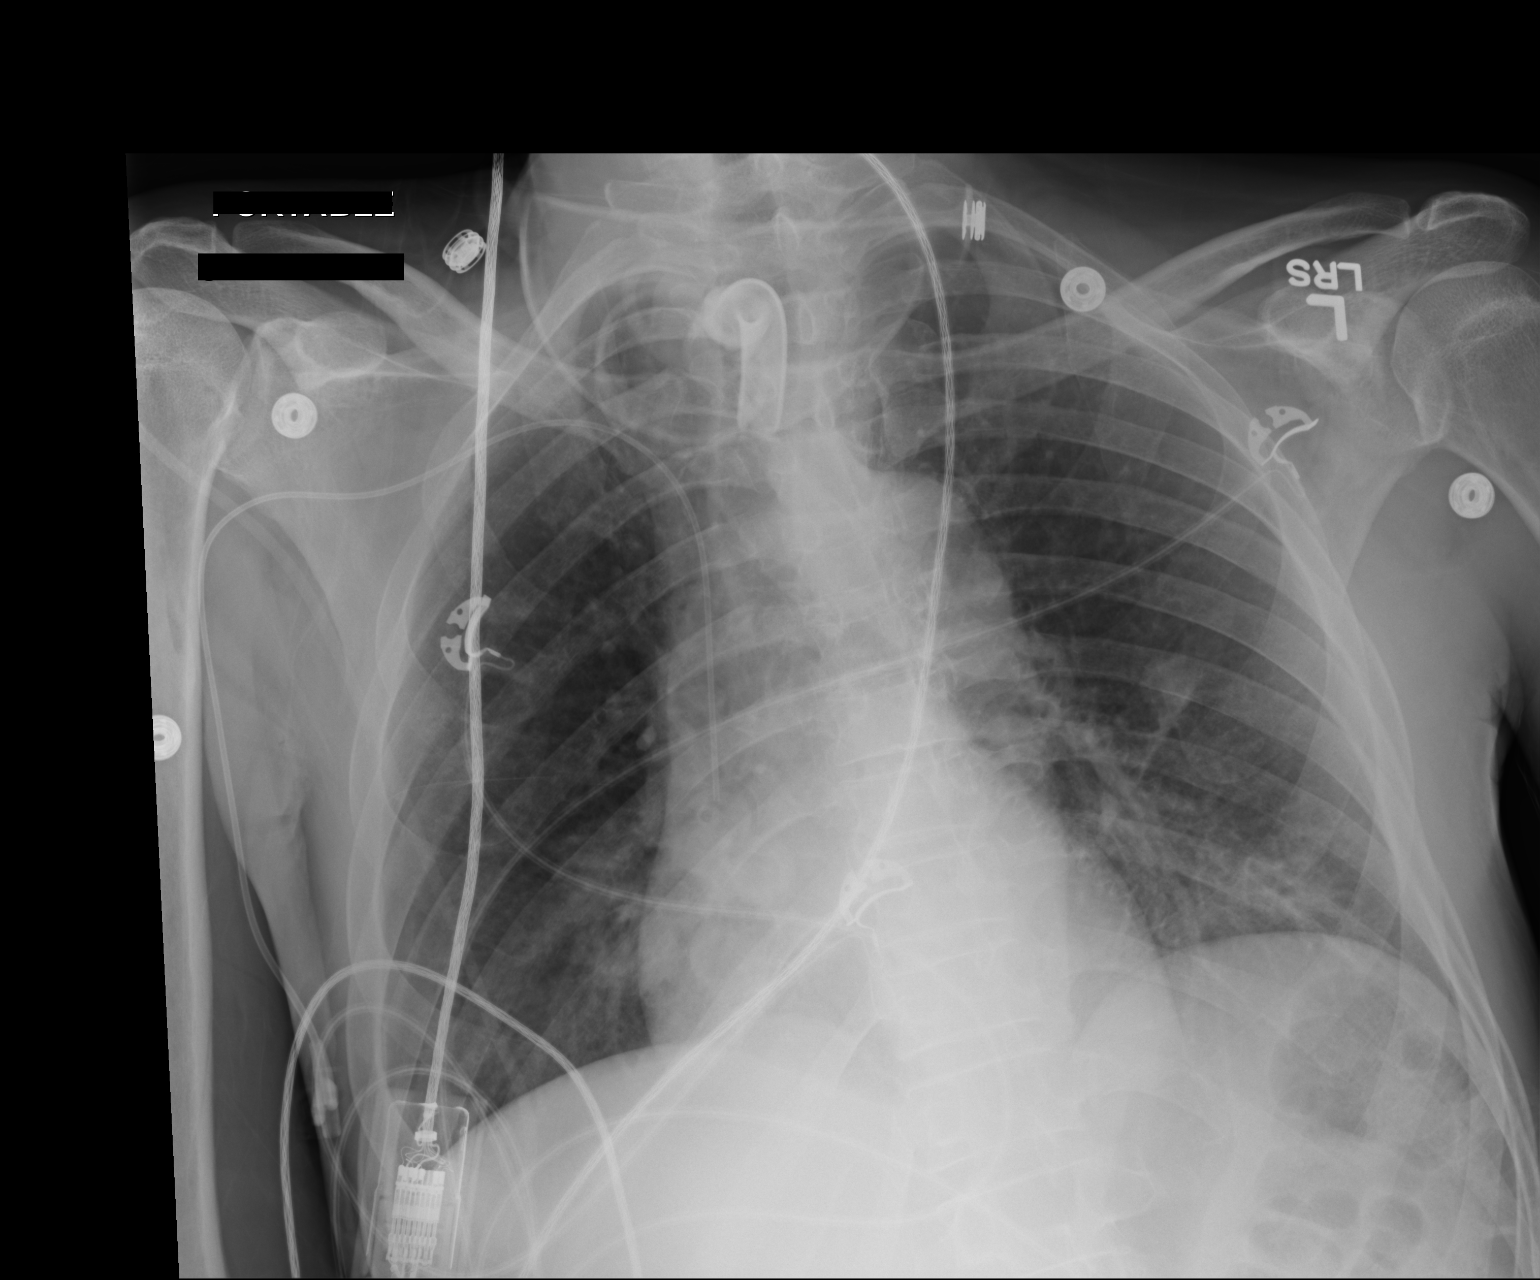

[1 of 1 positions shown; findings below may reference images not displayed]

FINDINGS: Right approach PICC line tip is at the cavoatrial junction. Previous
right IJ central venous catheter is been removed. Tracheostomy tube
tip is at the level of the clavicular heads. Enteric tube is been
removed.

Cardiomediastinal contours are unchanged. There is improved aeration
of both lung bases with near resolution of previously seen right
lower lung airspace opacities. There is mild residual opacity at the
left lung base. No pneumothorax or sizable pleural effusion.
IMPRESSION: 1. Right PICC line tip at the cavoatrial junction.
2. Improved bibasilar aeration with near resolution of right lower
lung airspace opacities. Mild residual left basilar opacity.

## 2018-03-20 IMAGING — CR DG CHEST 1V PORT
1 series · 1 of 1 positions shown · non-contrast
Comparison: 08/10/2016.

CLINICAL DATA: Code sepsis, nausea, vomiting, diarrhea, fever.

EXAM:
PORTABLE CHEST 1 VIEW

[portable]
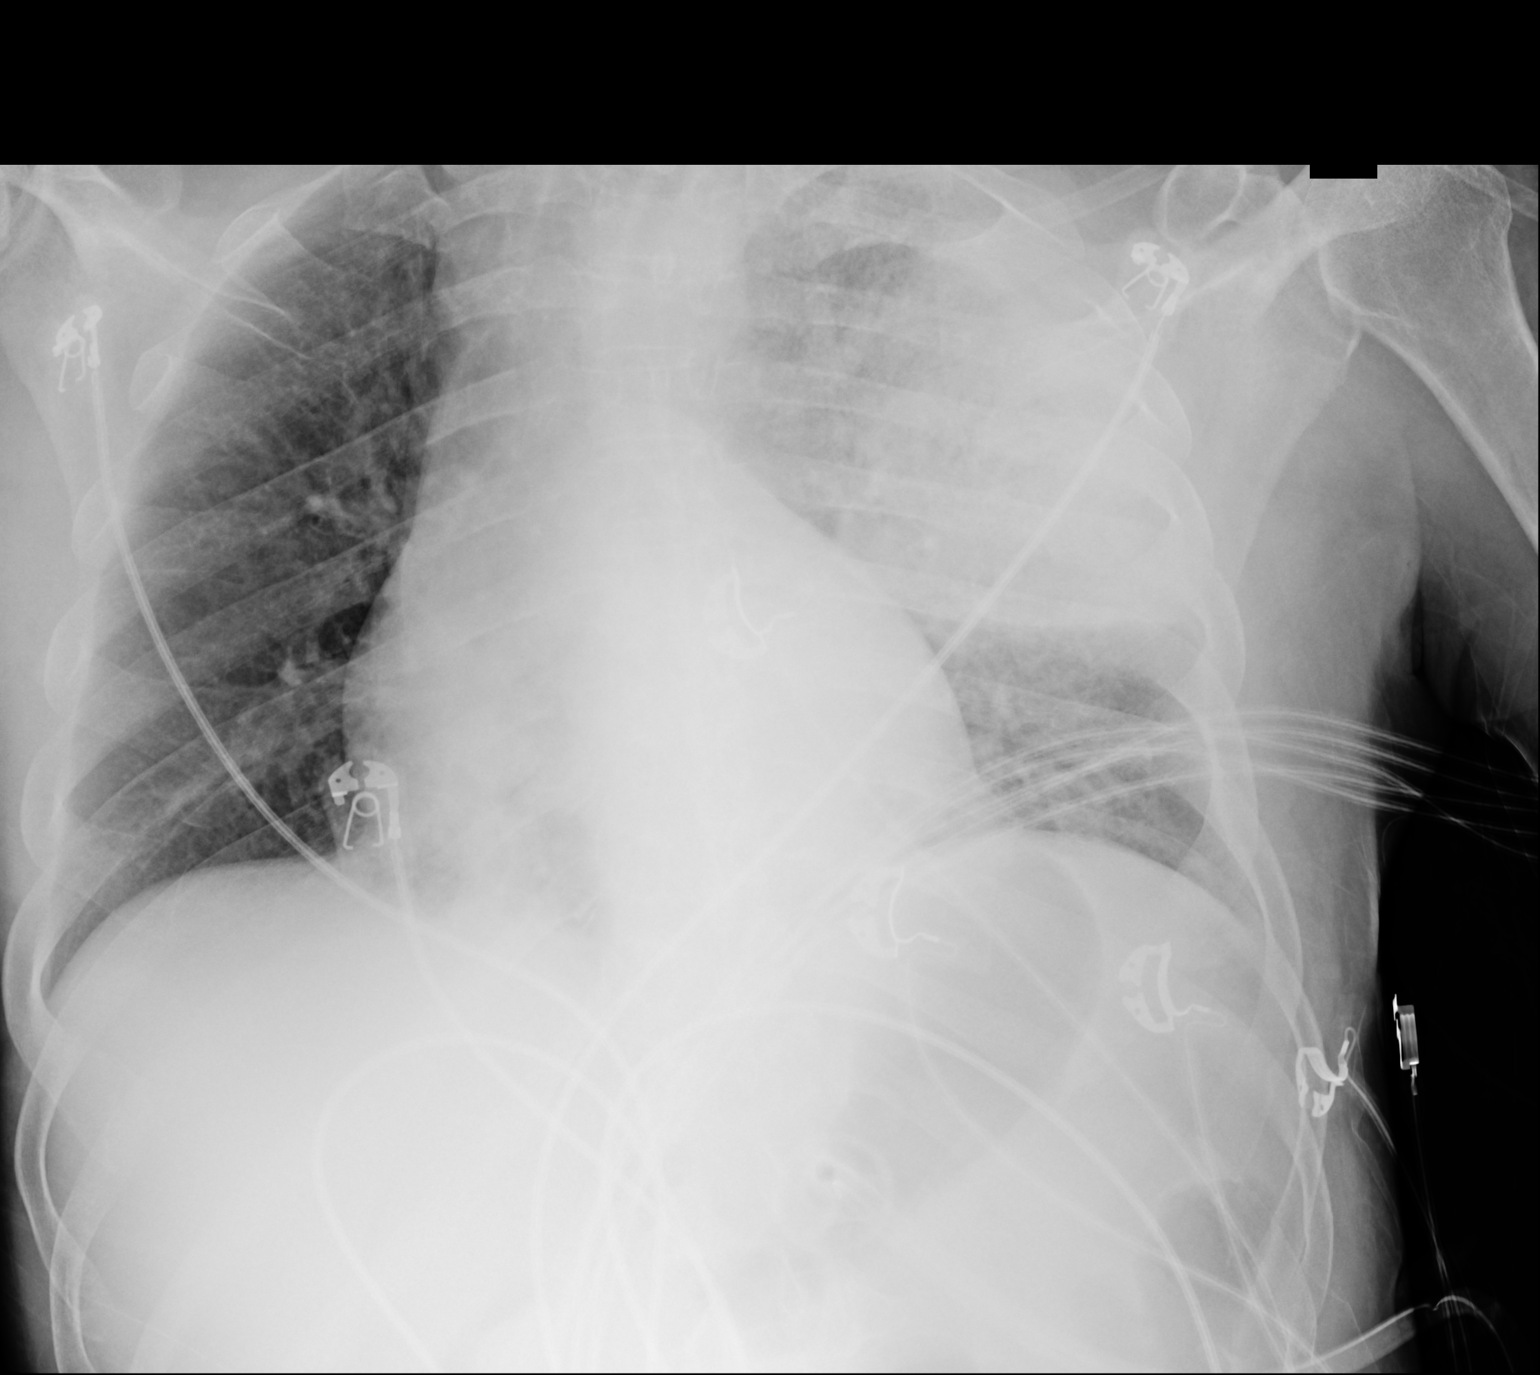

[1 of 1 positions shown; findings below may reference images not displayed]

FINDINGS: Trachea is midline. Heart size is accentuated by portable AP supine
technique. There is airspace consolidation in the left upper lobe.
Minimal consolidation in the medial aspect of the lower right hemi
thorax. No pleural fluid.
IMPRESSION: 1. Left upper lobe pneumonia. Followup PA and lateral chest X-ray is
recommended in 3-4 weeks following trial of antibiotic therapy to
ensure resolution and exclude underlying malignancy.
2. Atelectasis or pneumonia at the medial right lung base.

## 2018-03-20 IMAGING — CR DG CHEST 1V PORT
1 series · 1 of 1 positions shown · non-contrast
Comparison: 10/30/2016

CLINICAL DATA: Endotracheal tube in place after transport from
[HOSPITAL].

EXAM:
PORTABLE CHEST 1 VIEW

[AP]
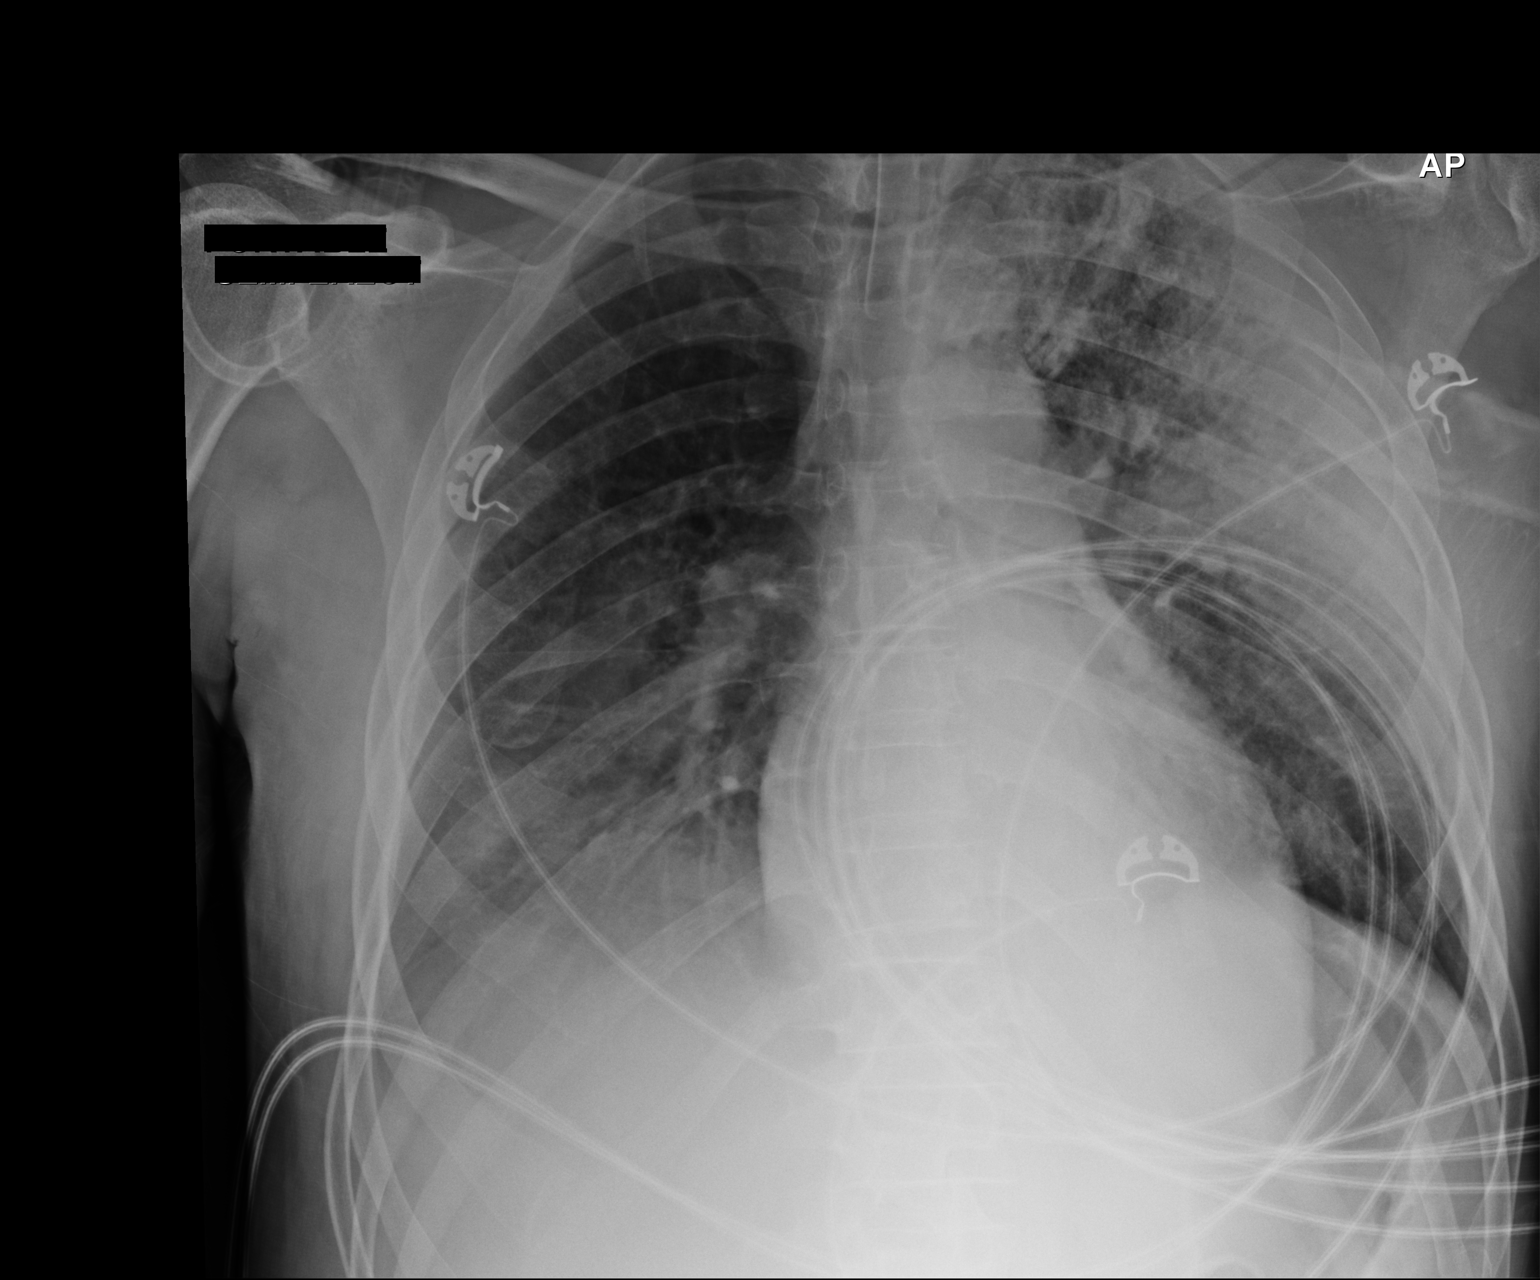

[1 of 1 positions shown; findings below may reference images not displayed]

FINDINGS: Endotracheal tube with tip measuring 5.8 cm above the carina.
Shallow inspiration. Right hemidiaphragm is obscured suggesting
small effusion or infiltration. Focal consolidation in the left mid
lung likely representing pneumonia. No pneumothorax. Normal heart
size and pulmonary vasculature.
IMPRESSION: Endotracheal tube appears in satisfactory position. Infiltration or
effusion in the right lung base obscuring the right hemidiaphragm.
Focal consolidation in the left mid lung consistent with pneumonia.

## 2018-03-21 IMAGING — DX DG ABDOMEN 1V
2 series · 2 of 2 positions shown · non-contrast
Comparison: 07/26/2016

CLINICAL DATA: Abdominal distention

EXAM:
ABDOMEN - 1 VIEW

[abdomen supine (1 of 2)]
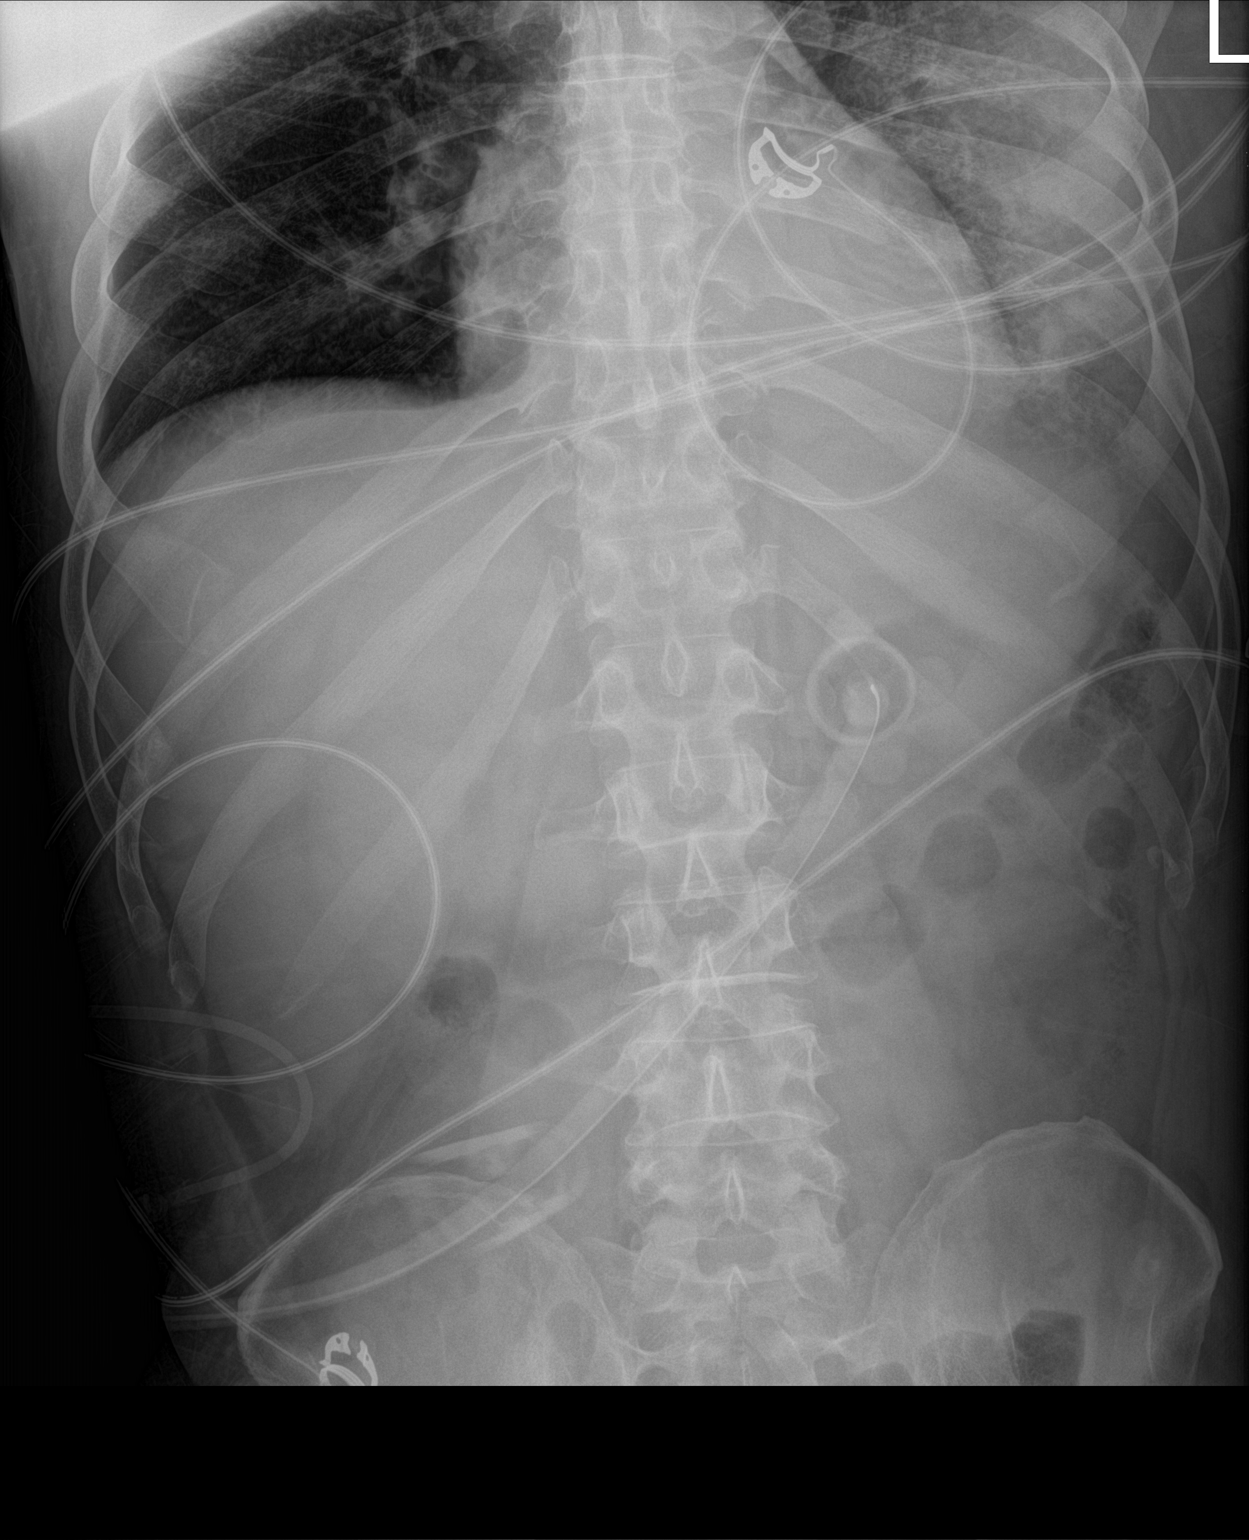

[abdomen supine (2 of 2)]
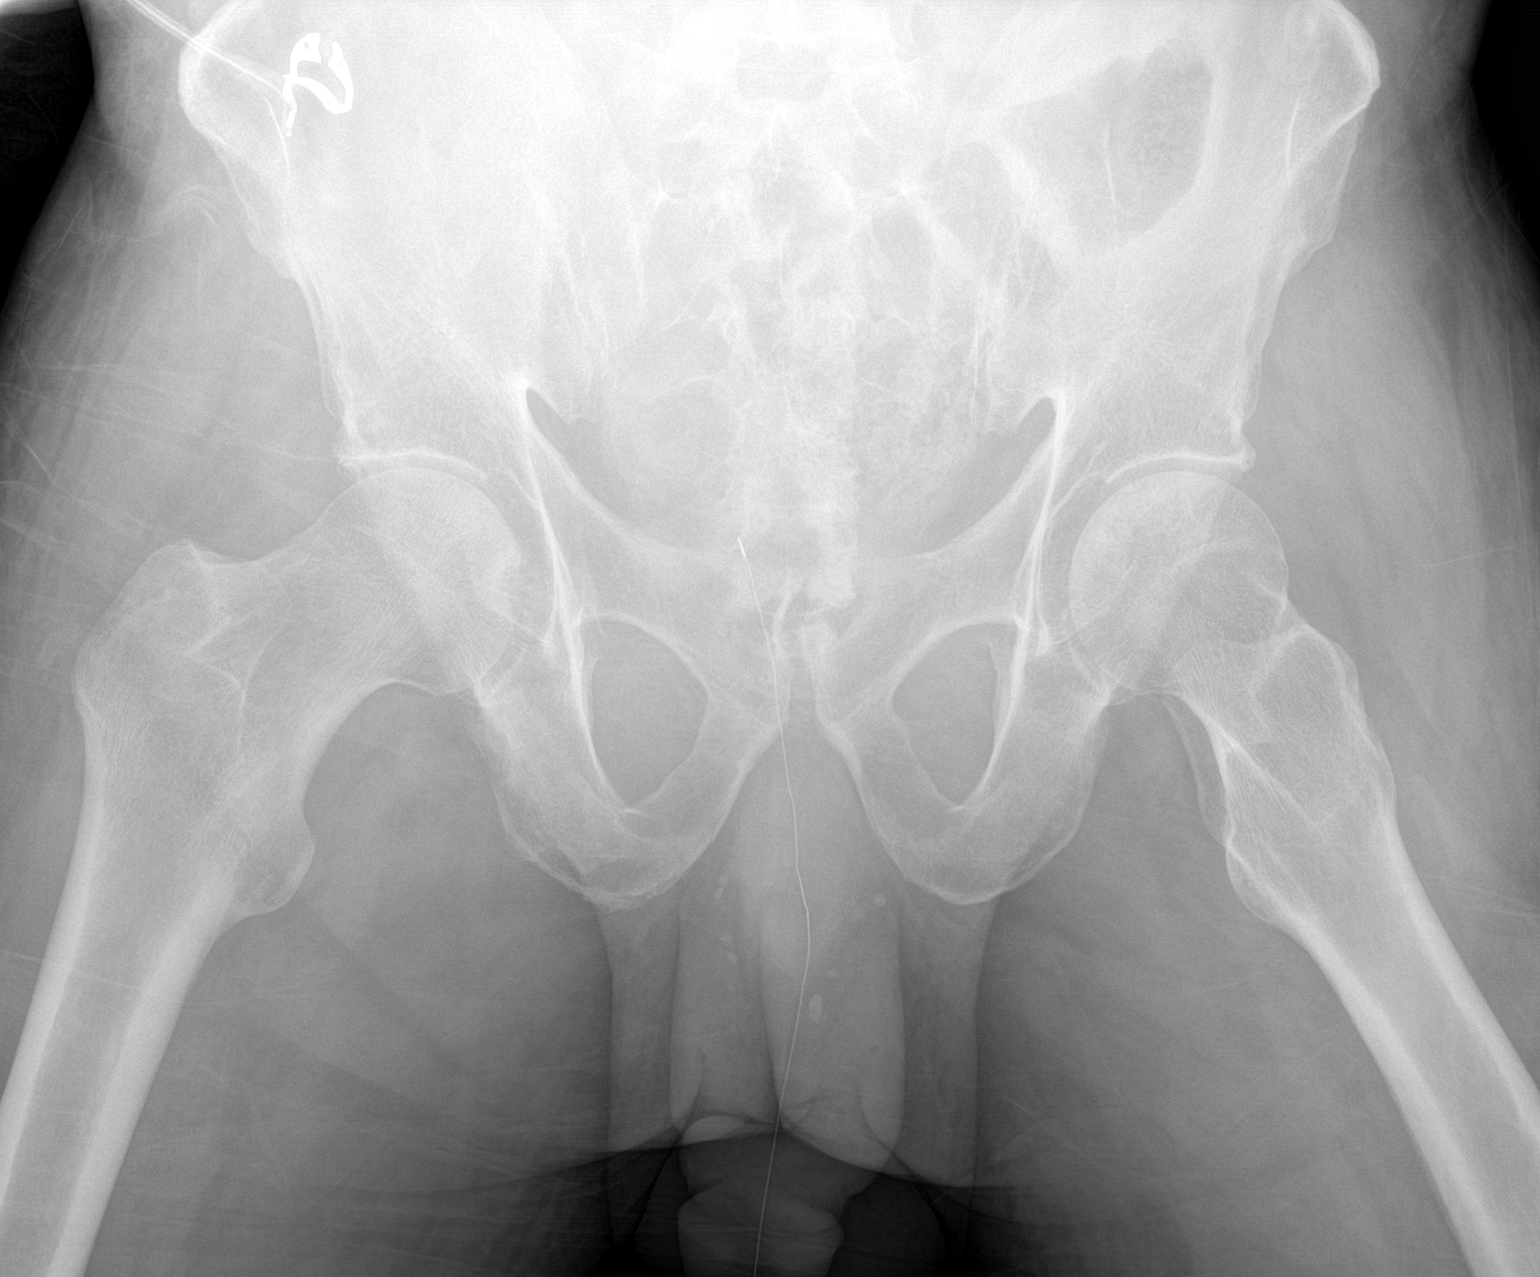

[2 of 2 positions shown; findings below may reference images not displayed]

FINDINGS: A pull-through gastrostomy tube has been placed. It projects over
the gastric bubble. No disproportionate dilatation of bowel. There
is no obvious free intraperitoneal gas. Hazy airspace disease is
suspected in the left lower lung zone.
IMPRESSION: Nonobstructive bowel gas pattern. Pull-through gastrostomy tube
placement.

Possible left lower lobe pneumonia. Correlation with chest
radiographs are recommended.

## 2018-03-22 IMAGING — CR DG CHEST 1V PORT
1 series · 1 of 1 positions shown · non-contrast
Comparison: 10/30/2016

CLINICAL DATA: Hypoxemic respiratory failure with shortness of
breath

EXAM:
PORTABLE CHEST 1 VIEW

[AP]
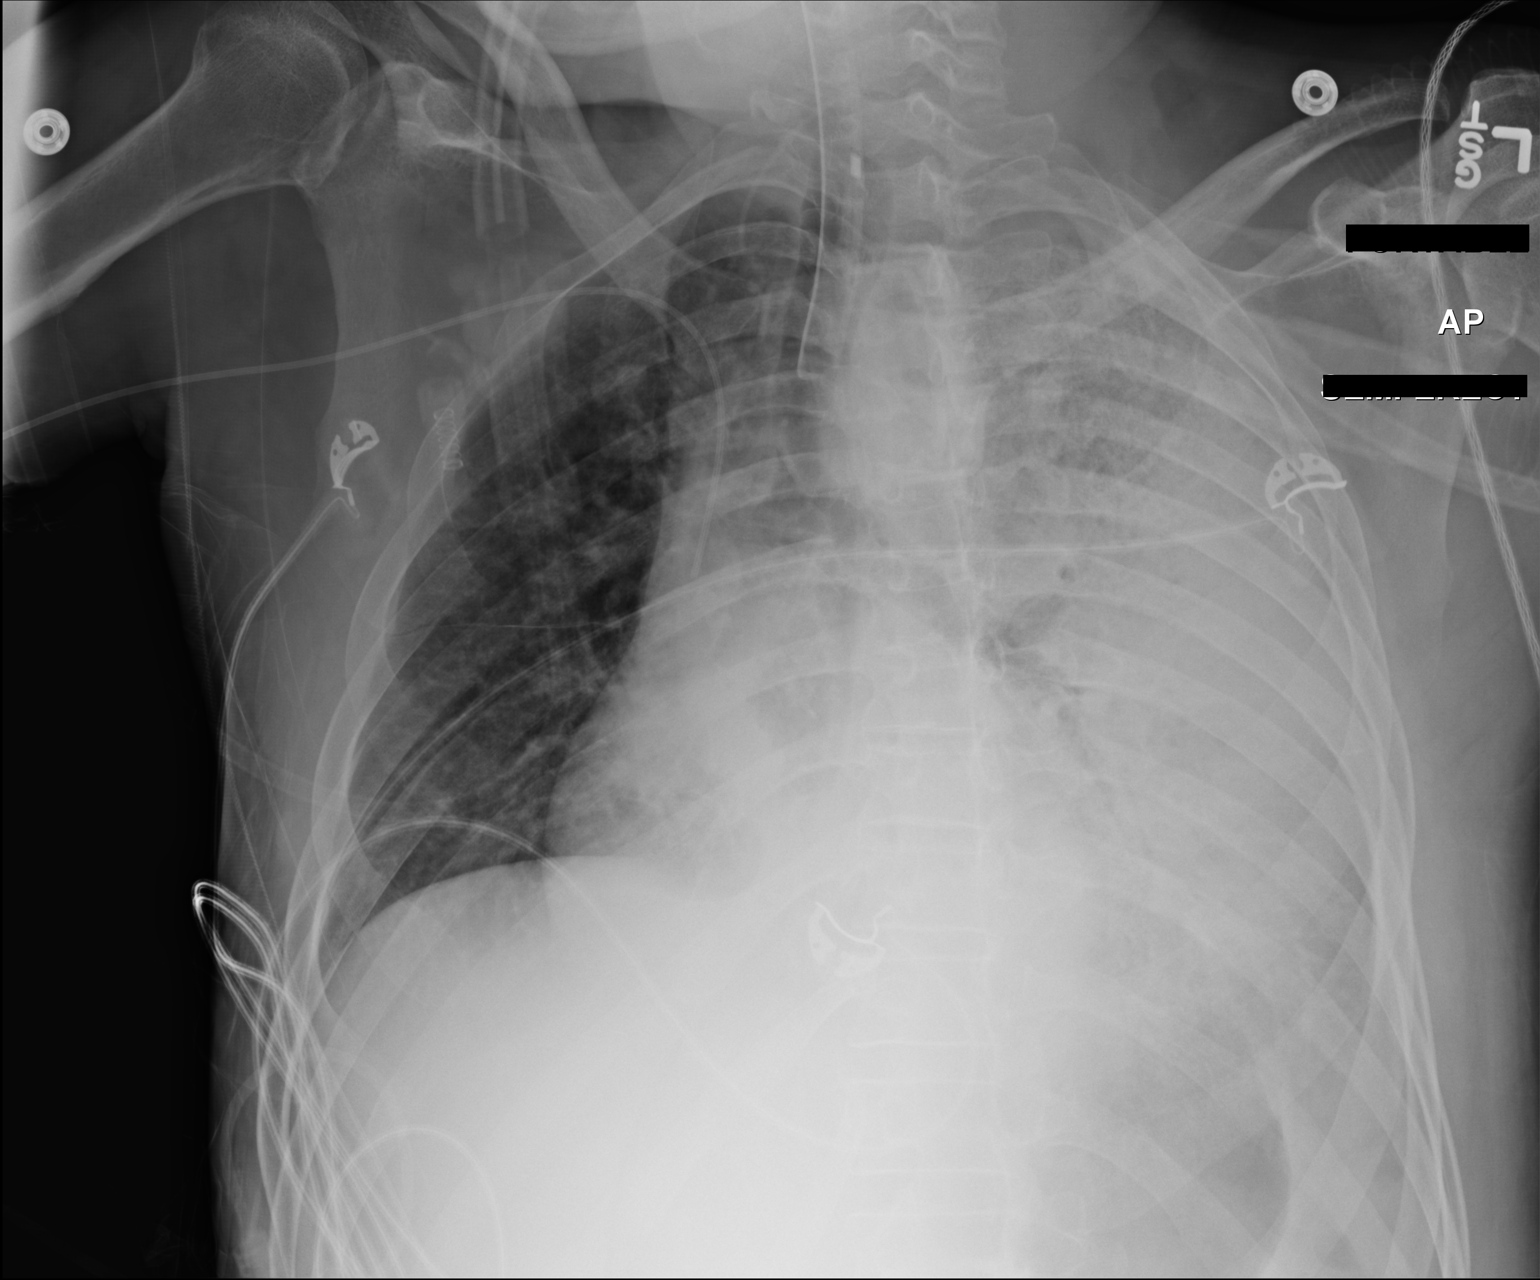

[1 of 1 positions shown; findings below may reference images not displayed]

FINDINGS: Endotracheal tube tip is approximately 2.9 cm superior to the
carina. Right upper extremity catheter has been inserted and the tip
projects over the mid SVC. Interim worsening of diffuse air space
consolidation of the left hemi thorax. Slightly improved aeration of
the right lung base. Cardiomediastinal silhouette is obscured.
IMPRESSION: 1. Support lines and tubes as above. Right upper extremity catheter
tip overlies the SVC.
2. Significant worsening of diffuse airspace disease in the left
thorax.
3. Improved aeration of the right base

## 2018-03-24 IMAGING — CR DG CHEST 1V PORT
1 series · 1 of 1 positions shown · non-contrast
Comparison: Chest radiograph performed 11/01/2016

CLINICAL DATA: Acute onset respiratory failure. Shortness of
breath. Initial encounter.

EXAM:
PORTABLE CHEST 1 VIEW

[AP]
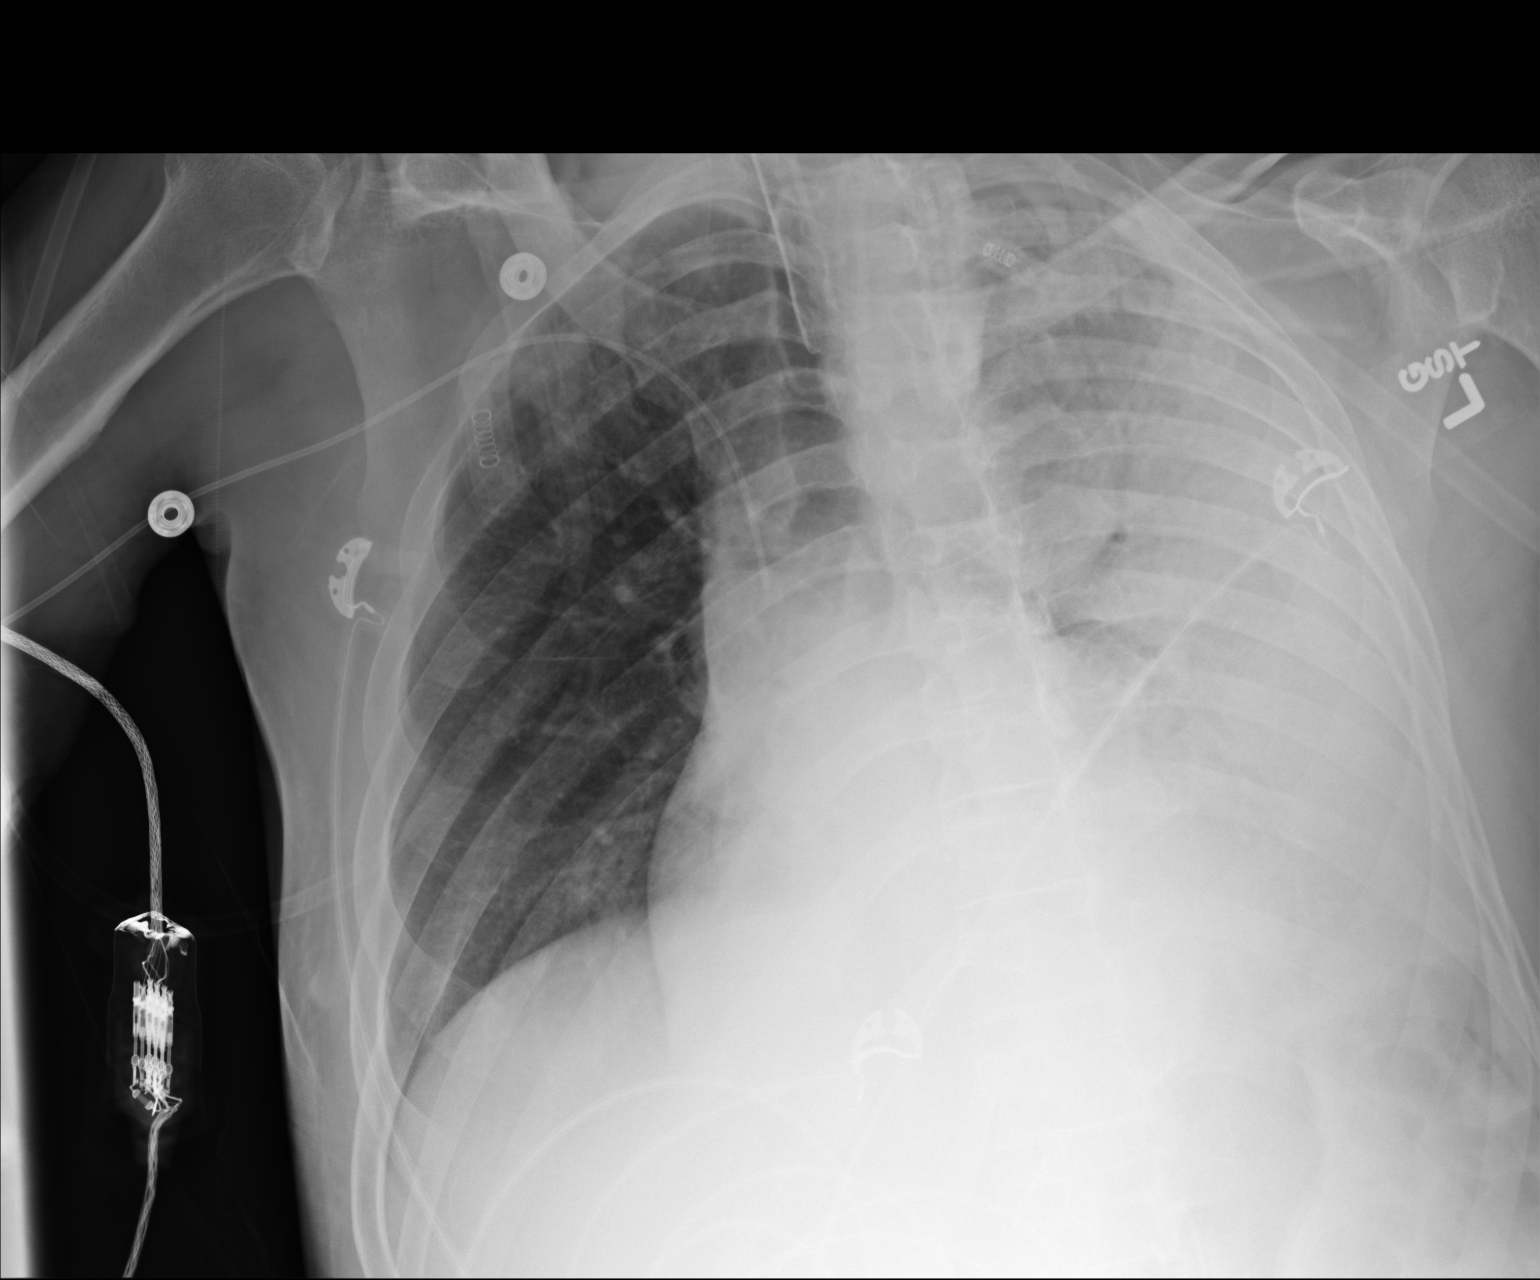

[1 of 1 positions shown; findings below may reference images not displayed]

FINDINGS: The patient's endotracheal tube is seen ending 5 cm above the
carina. A right PICC is noted ending about the mid SVC.

There is persistent diffuse left-sided airspace opacification,
concerning for pneumonia. A small left pleural effusion is
suspected. No pneumothorax is seen.

The cardiomediastinal silhouette is mildly enlarged. No acute
osseous abnormalities are identified.
IMPRESSION: 1. Endotracheal tube seen ending 5 cm above the carina.
2. Persistent diffuse left-sided airspace opacification, concerning
for pneumonia. Suspect small left pleural effusion.
3. Mild cardiomegaly.

## 2018-03-26 IMAGING — CR DG CHEST 1V PORT
1 series · 1 of 1 positions shown · non-contrast
Comparison: 11/03/2016

CLINICAL DATA: Acute respiratory failure.

EXAM:
PORTABLE CHEST 1 VIEW

[AP]
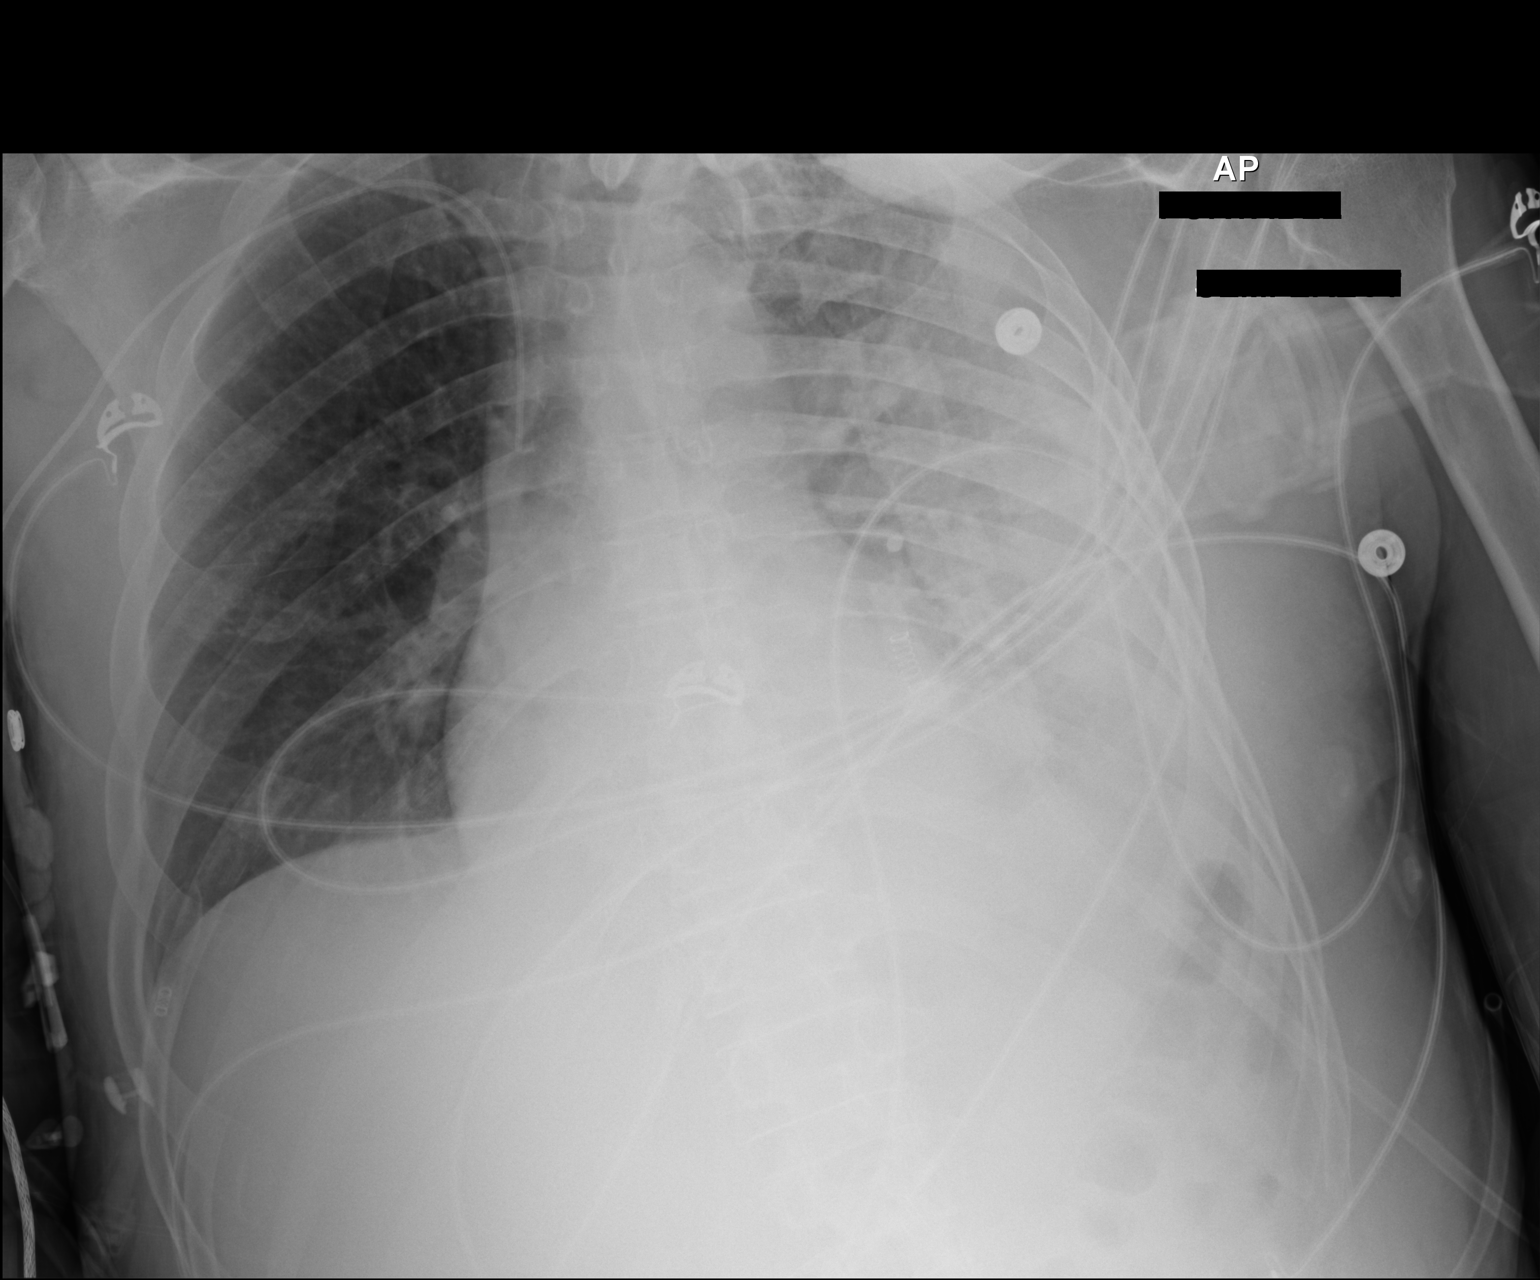

[1 of 1 positions shown; findings below may reference images not displayed]

FINDINGS: Endotracheal tube has been removed and the patient now has a
tracheostomy tube. PICC line tip in the SVC region. Diffuse
densities throughout the left hemithorax are not significantly
changed. Right lung remains clear. Heart size appears to be grossly
stable and within normal limits.
IMPRESSION: Diffuse densities throughout the left chest are minimally changed.

Interval placement of a tracheostomy tube.

## 2018-04-06 IMAGING — CR DG CHEST 1V PORT
1 series · 1 of 1 positions shown · non-contrast
Comparison: Portable chest x-ray November 15, 2016

CLINICAL DATA: Acute hypoxia today

EXAM:
PORTABLE CHEST 1 VIEW

[portable]
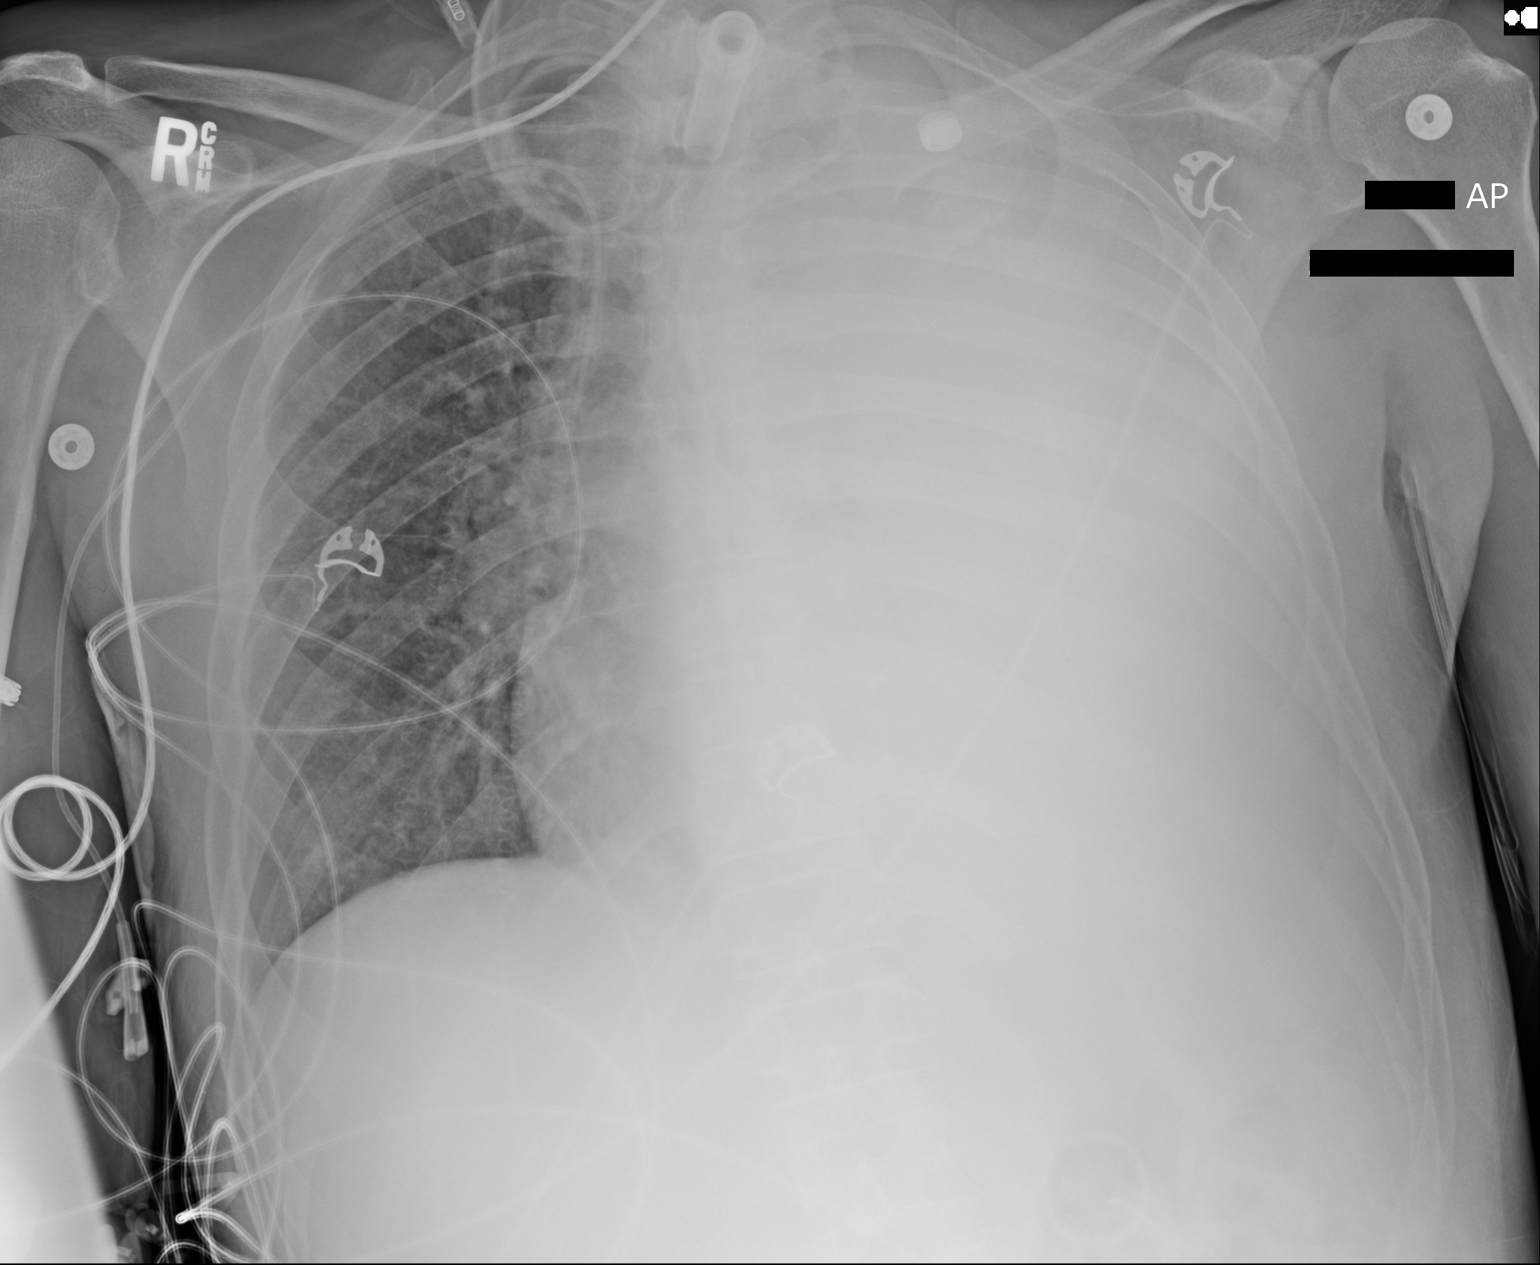

[1 of 1 positions shown; findings below may reference images not displayed]

FINDINGS: There is persistent complete opacification of the left hemithorax.
There is mild shift of mediastinum toward the right. The lung
markings throughout the right lung are more conspicuous today. The
pulmonary vascularity is not clearly engorged. The tracheostomy
appliance tip projects at the level of the clavicular heads. The
PICC line tip projects over the proximal SVC.
IMPRESSION: Complete opacification of the left hemithorax, stable. Mild
interstitial prominence throughout the right lung which is new and
may reflect interstitial edema or less likely pneumonia.

## 2018-04-07 IMAGING — CR DG CHEST 1V PORT
1 series · 1 of 1 positions shown · non-contrast
Comparison: Chest x-ray of 11/16/2016, 08/10/2016, and 02/13/2016

CLINICAL DATA: Acute hypoxemic respiratory failure portable chest
x-ray of 11/16/2016

EXAM:
PORTABLE CHEST 1 VIEW

[AP]
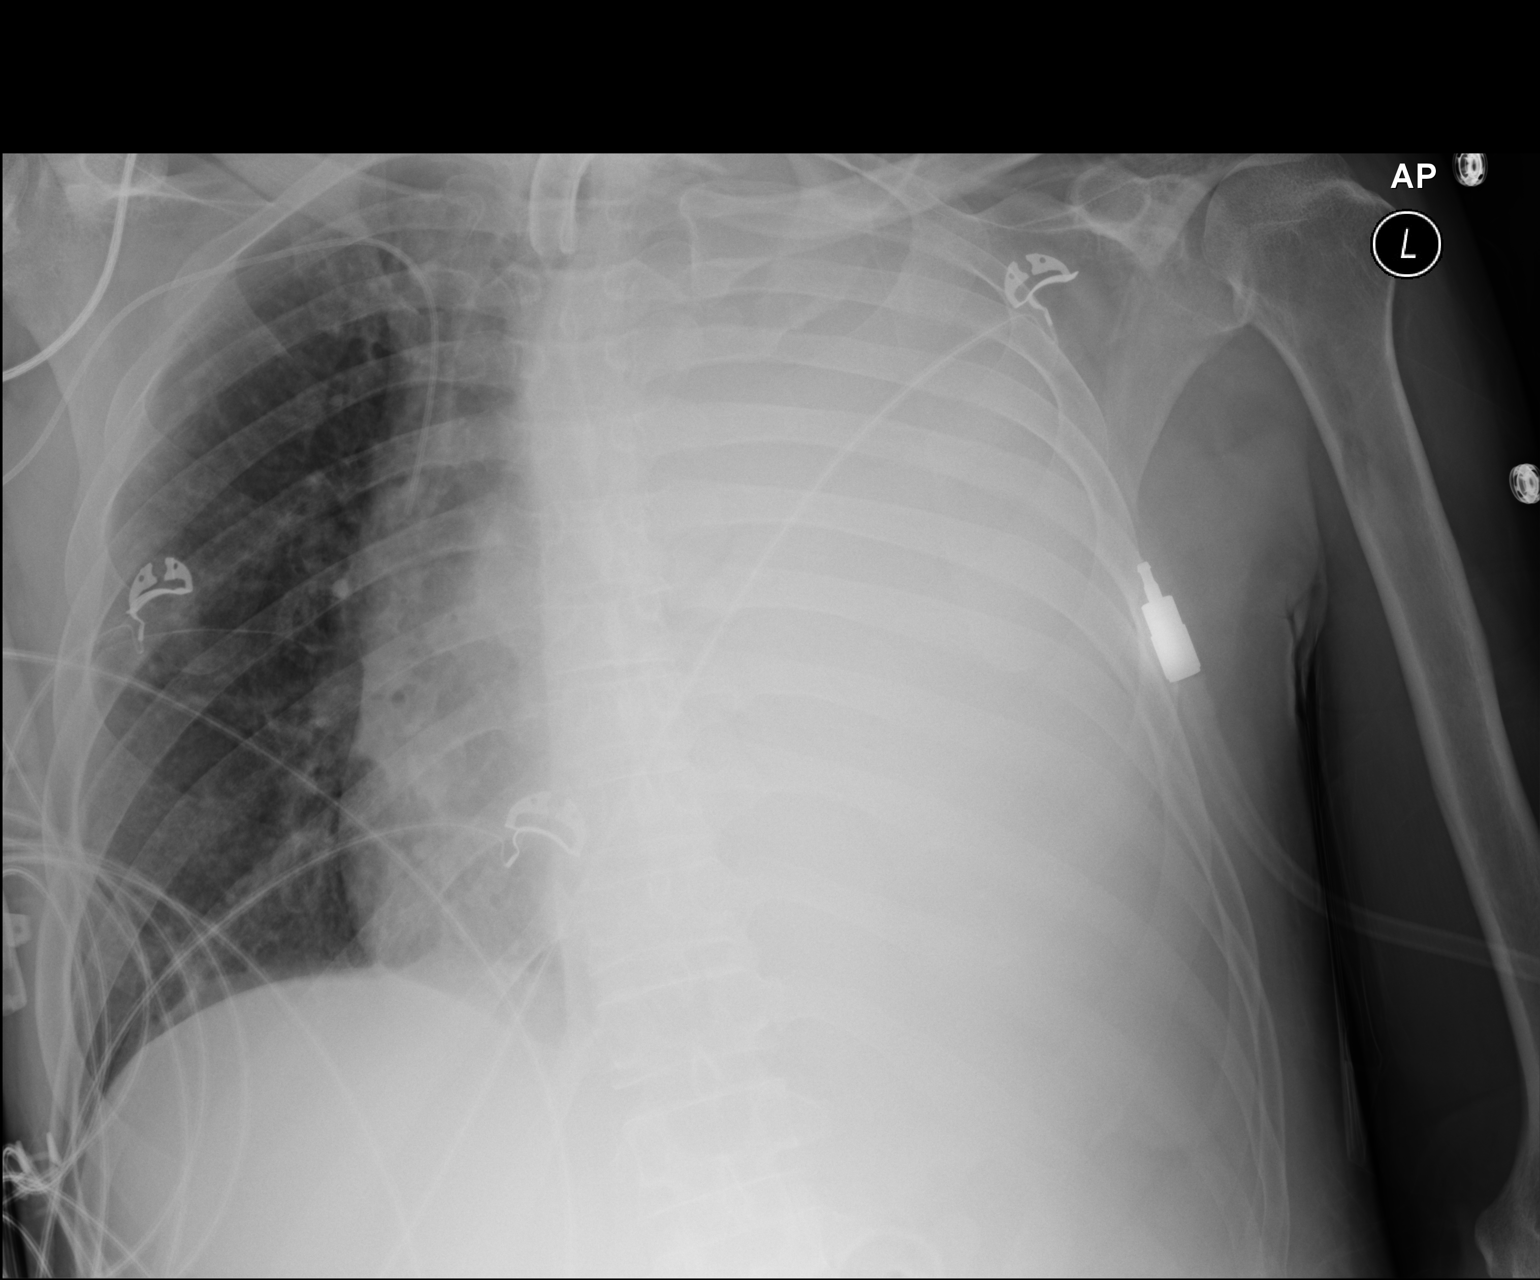

[1 of 1 positions shown; findings below may reference images not displayed]

FINDINGS: There is little change in complete opacification of the left
hemithorax. No significant mediastinal shift is noted therefore some
of the opacification may be due to atelectasis as well as fluid or
mass effect. The right lung is grossly clear. Tracheostomy and right
central venous line remain.
IMPRESSION: 1. No change in complete opacification of the left hemithorax with
no significant mediastinal shift.
2. Right lung is clear.
3. Tracheostomy and right central venous line are unchanged in
position.

## 2018-04-08 IMAGING — CR DG CHEST 1V PORT
1 series · 1 of 1 positions shown · non-contrast
Comparison: Radiograph November 17, 2016.

CLINICAL DATA: Atelectasis.

EXAM:
PORTABLE CHEST 1 VIEW

[AP]
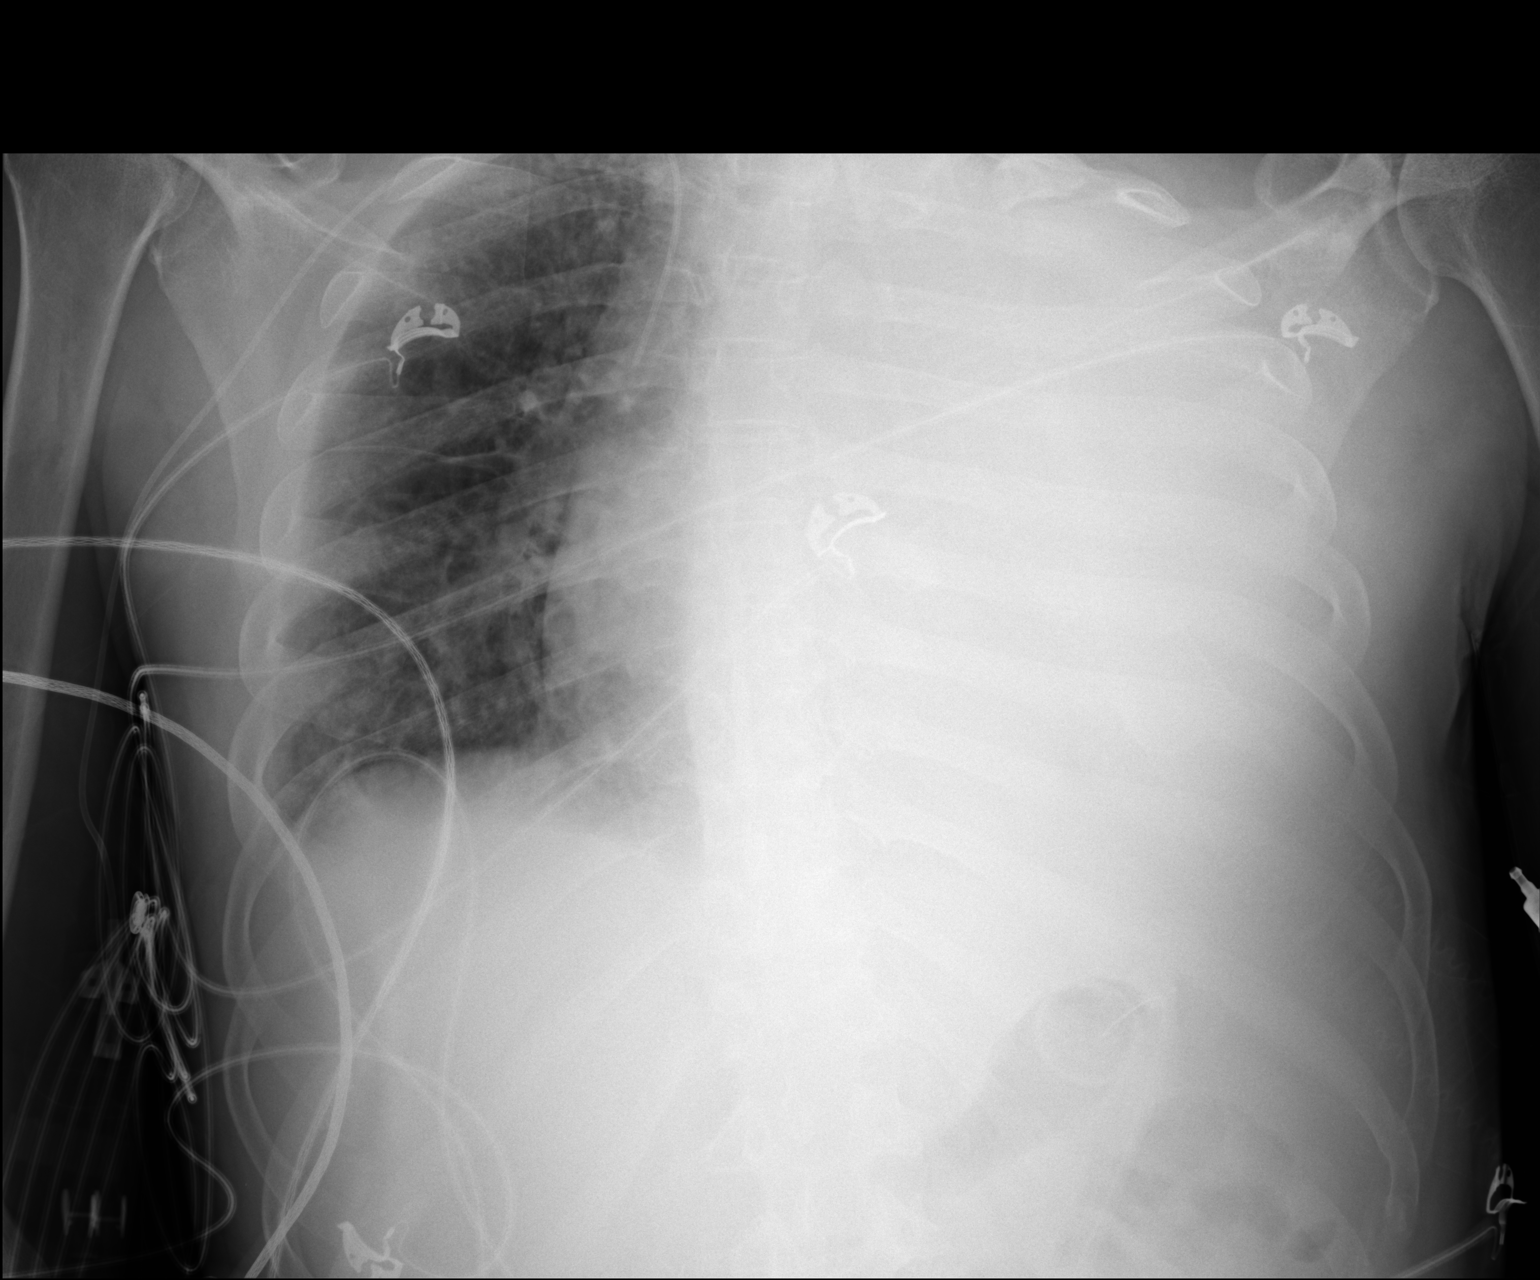

[1 of 1 positions shown; findings below may reference images not displayed]

FINDINGS: Stable complete opacification of left hemithorax without significant
mediastinal shift. Tracheostomy tube is unchanged in position.
Right-sided PICC line is unchanged. No pneumothorax is noted. Bony
thorax is unremarkable. Gastrostomy tube is seen in visualized
portion of upper abdomen.
IMPRESSION: Stable complete opacification of the left hemithorax without
significant mediastinal shift. This is concerning for pleural
effusion.

## 2018-04-09 IMAGING — CR DG CHEST 1V PORT
1 series · 1 of 1 positions shown · non-contrast
Comparison: 11/18/2016

CLINICAL DATA: Collapse left lung

EXAM:
PORTABLE CHEST 1 VIEW

[AP]
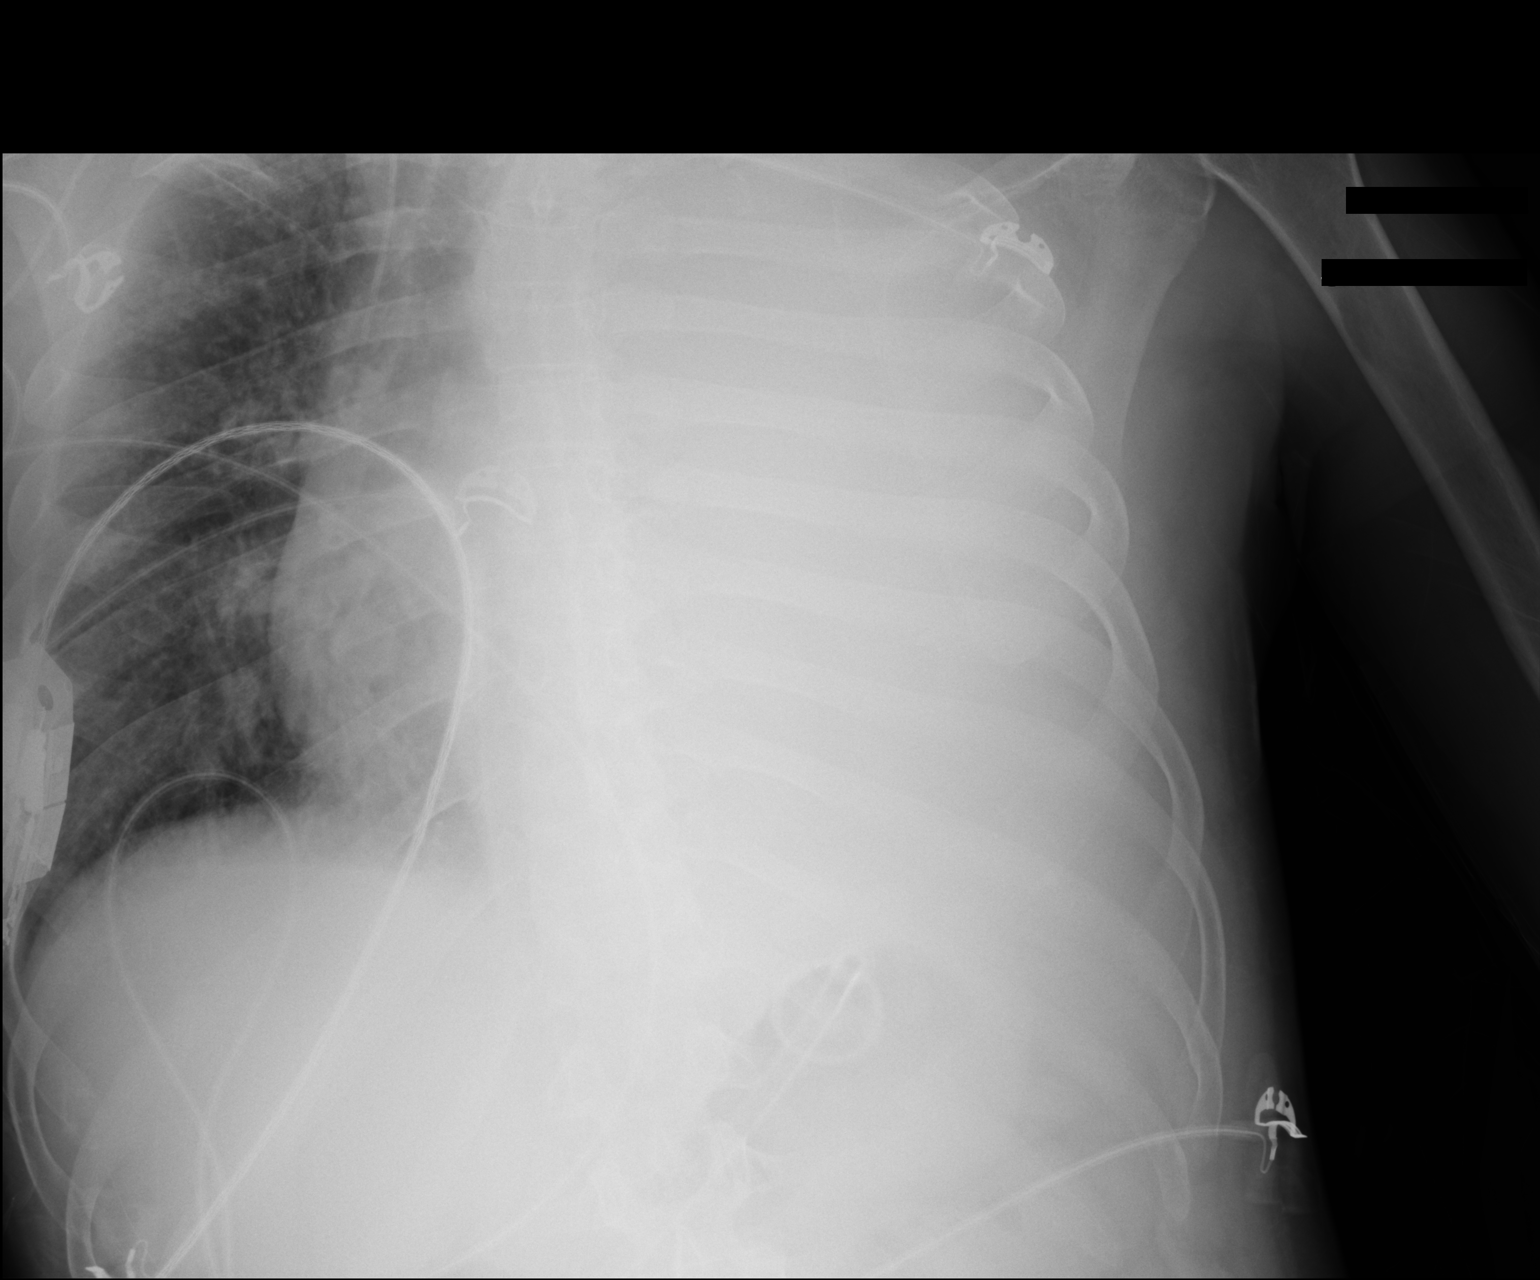

[1 of 1 positions shown; findings below may reference images not displayed]

FINDINGS: Tracheostomy tube and right PICC line remain in place, unchanged.
Complete opacification of the left hemithorax is stable. Shift of
mediastinal structures to the right. Mild vascular congestion on the
right. No visible effusion on the right.
IMPRESSION: Complete opacification of the left hemithorax with mediastinal shift
to the right. No change since prior study.

## 2018-04-11 IMAGING — CR DG CHEST 1V PORT
1 series · 1 of 1 positions shown · non-contrast
Comparison: 11/20/2016.

CLINICAL DATA: Pleural effusion.  Tracheostomy tube .

EXAM:
PORTABLE CHEST 1 VIEW

[portable]
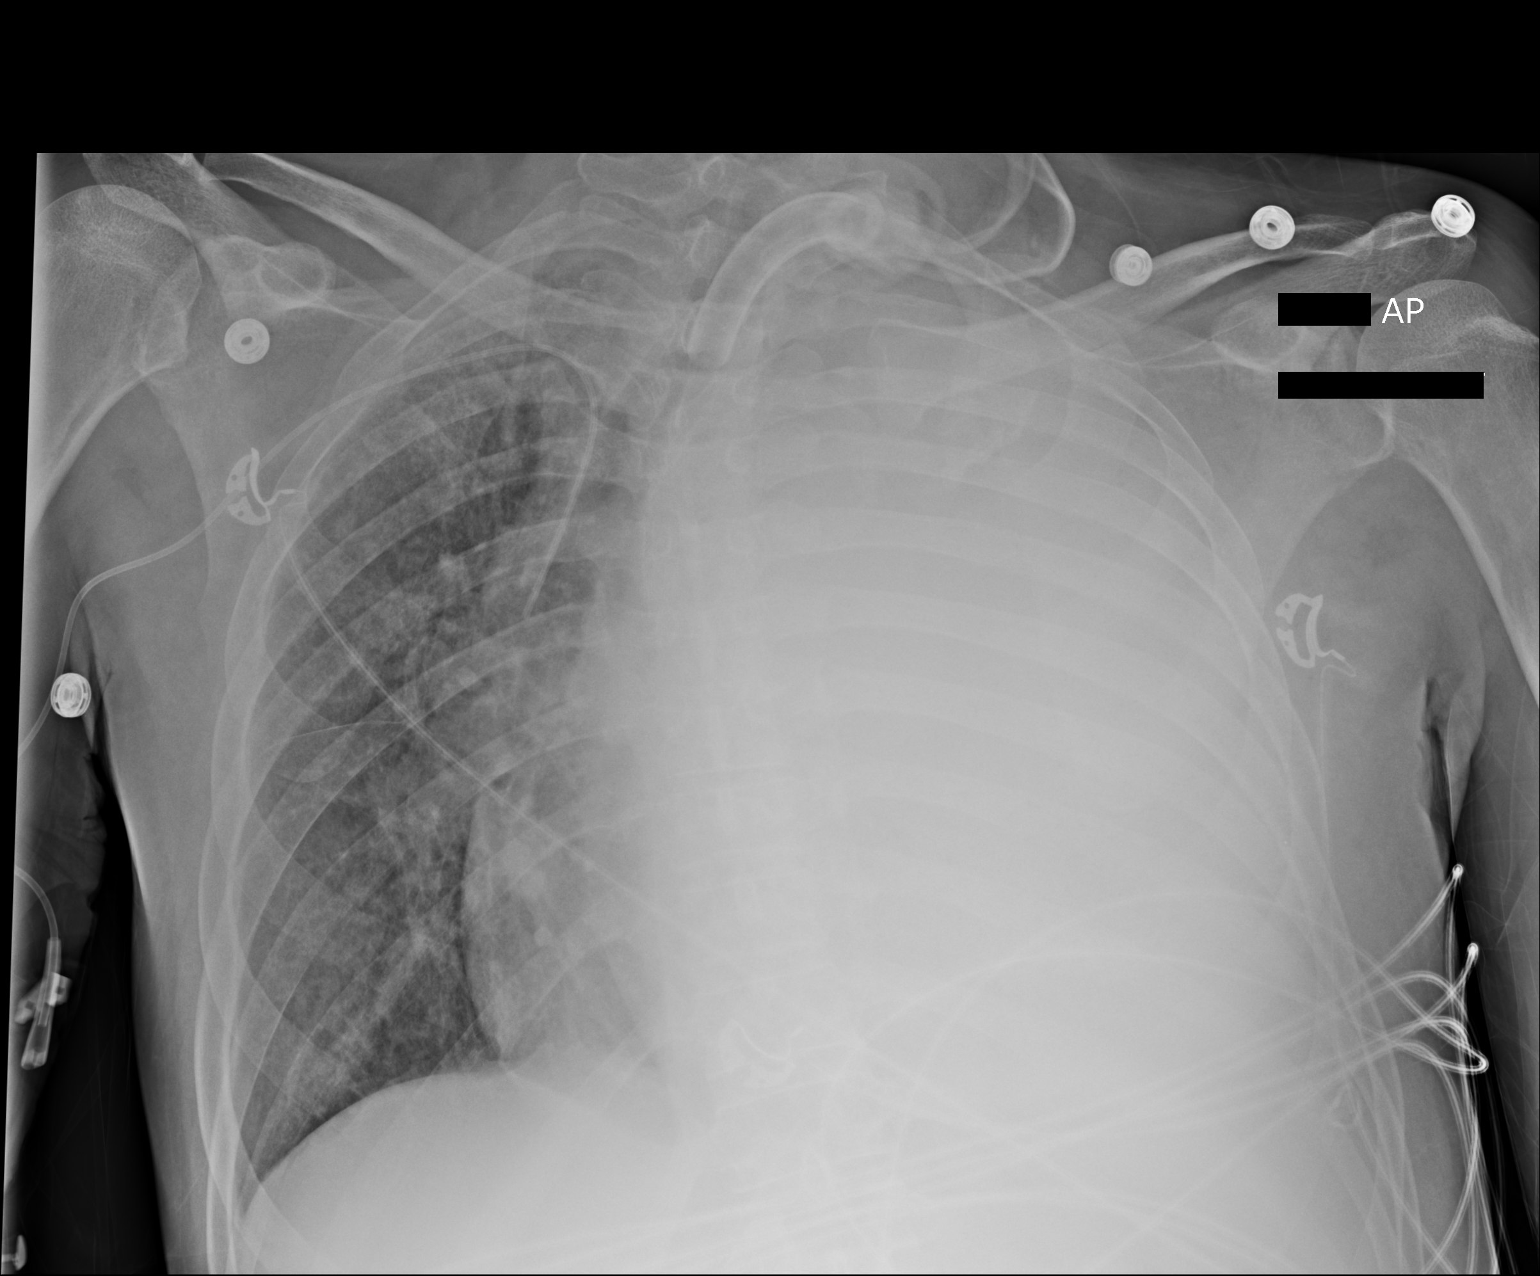

[1 of 1 positions shown; findings below may reference images not displayed]

FINDINGS: Tracheostomy tube noted in stable position. Right PICC line noted in
stable position. Complete opacification of the left hemithorax again
noted. Shift of the mediastinum heart the right noted. These
findings again suggest a left pleural effusion. Underlying left lung
consolidation cannot be excluded.
IMPRESSION: 1. Tracheostomy tube in stable position.

2. Unchanged chest with complete opacification of the left
hemithorax. Findings consistent with left pleural effusion.
Underlying left lung consolidation cannot be excluded .

## 2018-04-12 IMAGING — CT CT CHEST W/O CM
2 of 3 series · 15 of 36 positions shown, 18 images · non-contrast
Comparison: Multiple chest radiographs most recently yesterday.

CLINICAL DATA: Left chest opacification

EXAM:
CT CHEST WITHOUT CONTRAST
TECHNIQUE: Multidetector CT imaging of the chest was performed following the
standard protocol without IV contrast.

[Series 2: chest w/o 2mm st · axial · non-contrast · 0.72mm/px · z∈[+998,+1250]mm · 12 of 150 slices shown, 15 images]
[im 12/150  mediastinal]
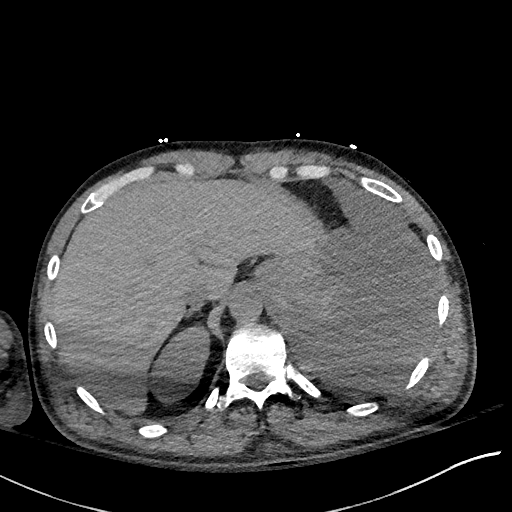
[im 12/150  lung]
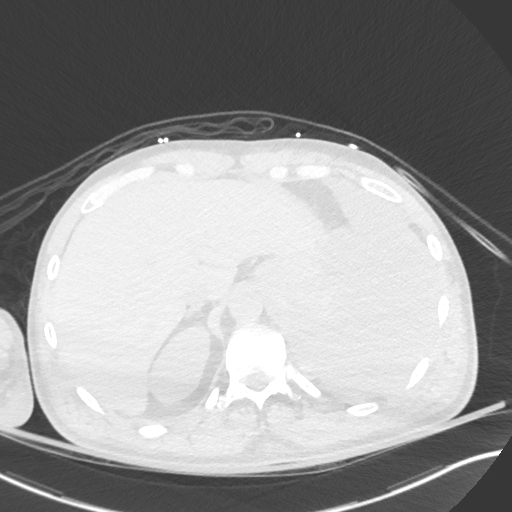
[im 23/150  lung]
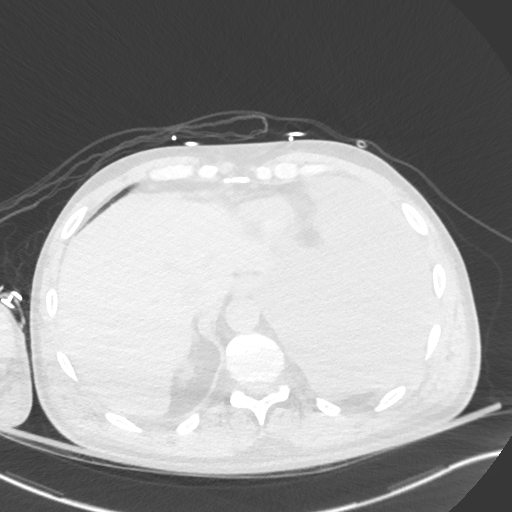
[im 34/150  lung]
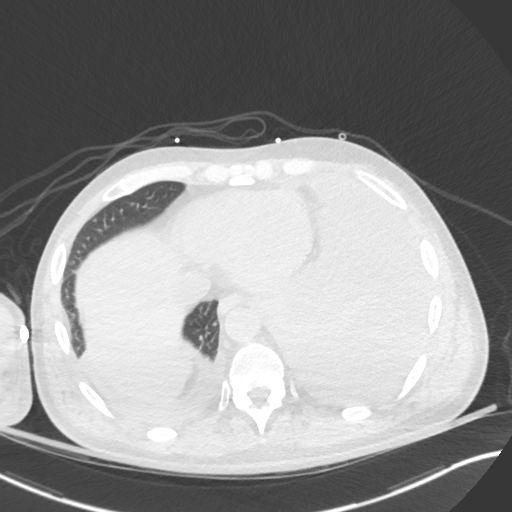
[im 45/150  lung]
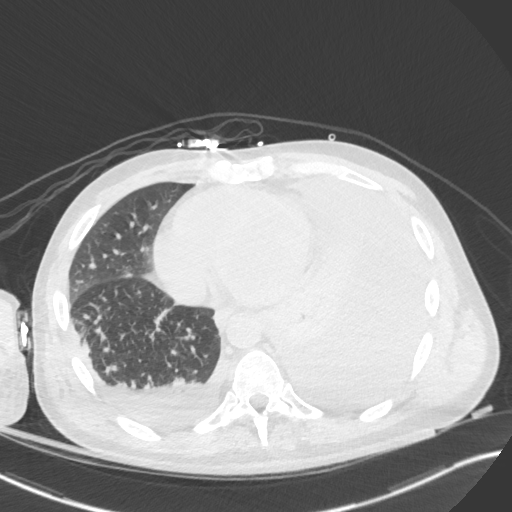
[im 56/150  mediastinal]
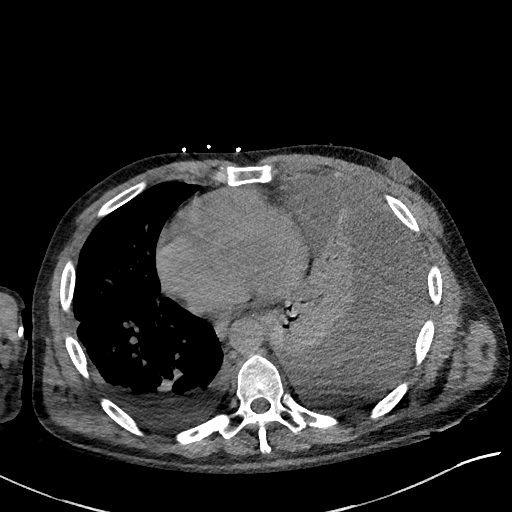
[im 56/150  lung]
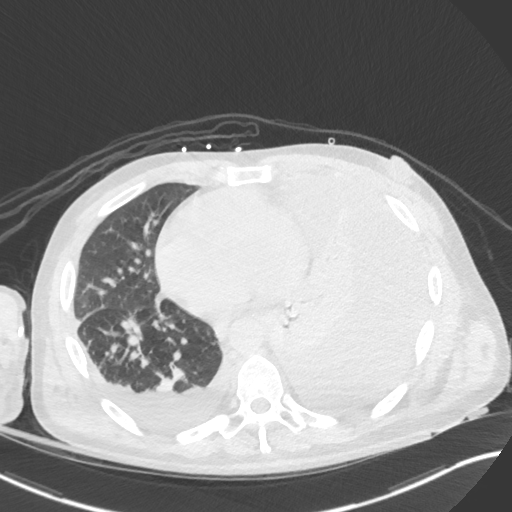
[im 67/150  lung]
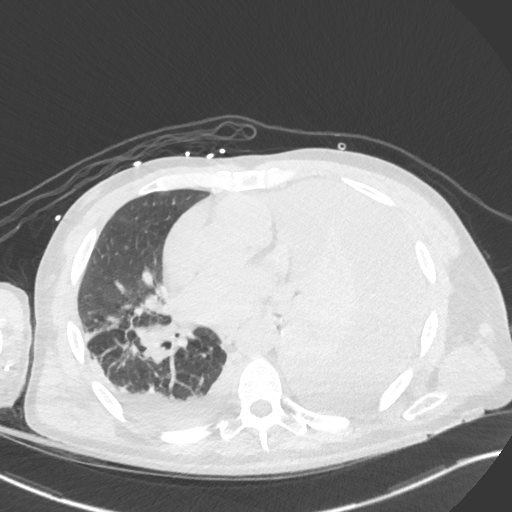
[im 83/150  lung]
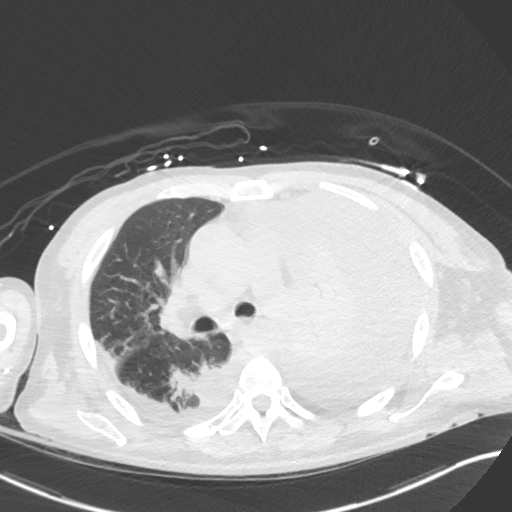
[im 94/150  lung]
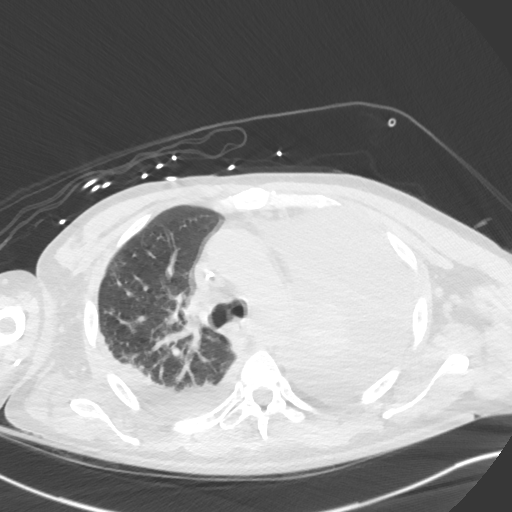
[im 105/150  mediastinal]
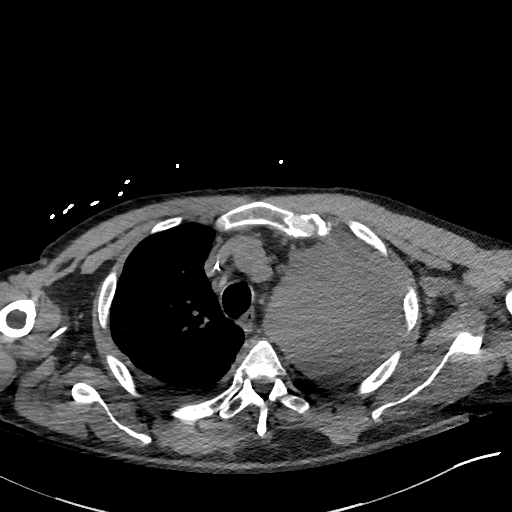
[im 105/150  lung]
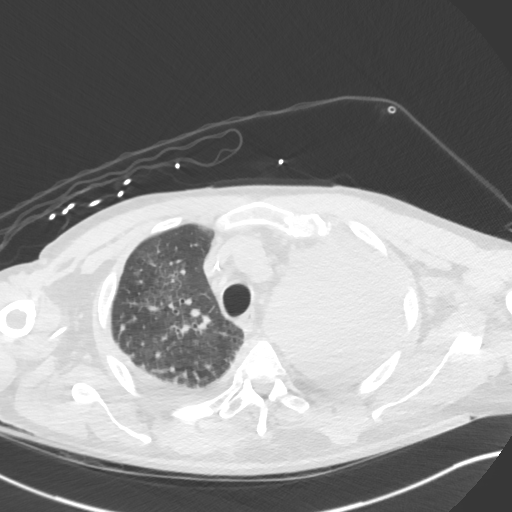
[im 116/150  lung]
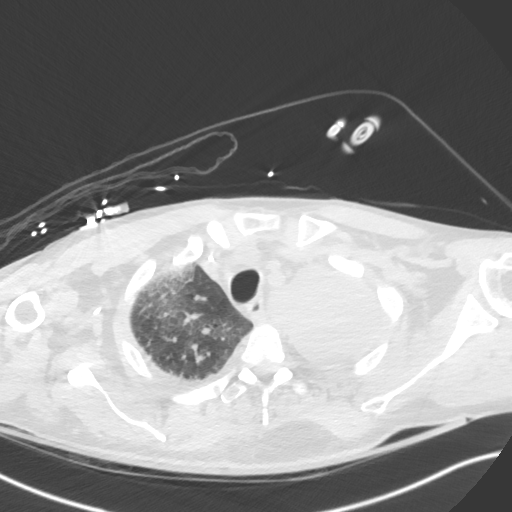
[im 127/150  lung]
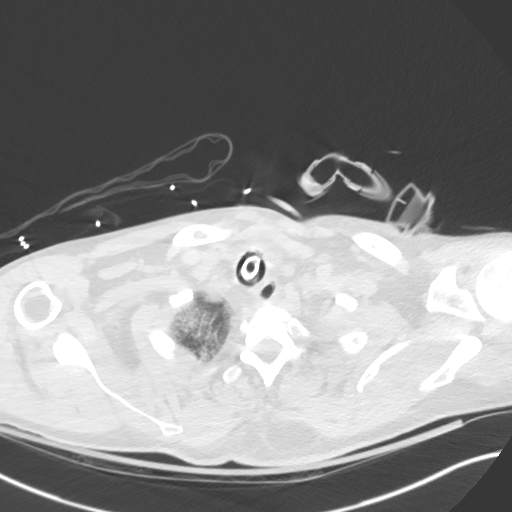
[im 138/150  lung]
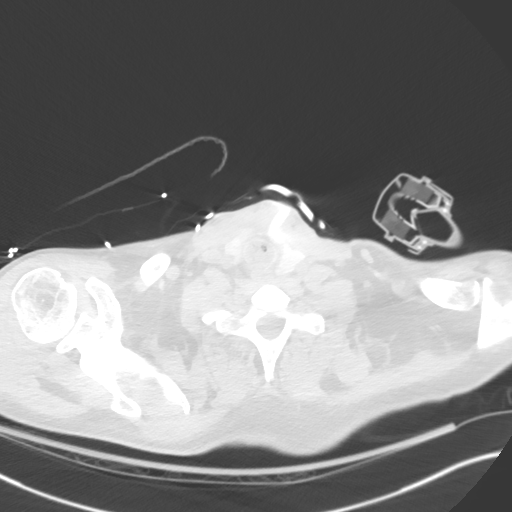

[Series 4: chest w/o 3mm st cor · coronal · non-contrast · 0.59mm/px · 3 of 101 slices shown]
[im 21/101  lung]
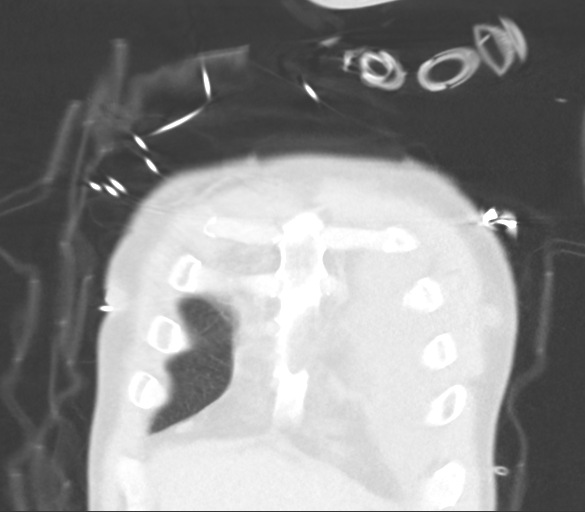
[im 41/101  lung]
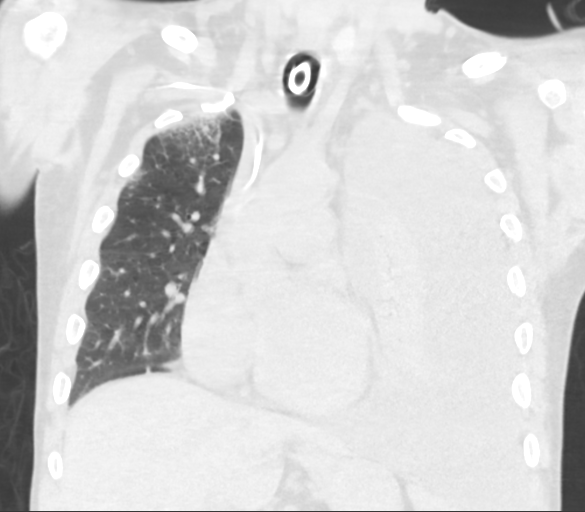
[im 61/101  lung]
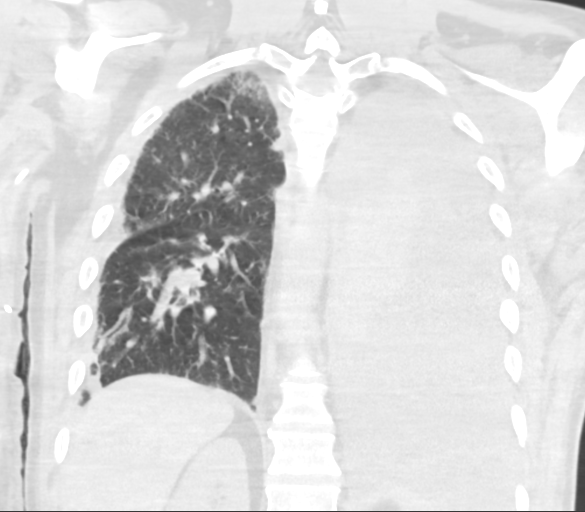

[15 of 36 positions shown; findings below may reference images not displayed]

FINDINGS: Cardiovascular: Heart size is normal. Very small amount of
pericardial fluid. No aortic or coronary calcification identified.

Mediastinum/Nodes: Mediastinal shift towards the right. No
pathologically enlarged lymph nodes identified.

Lungs/Pleura: Complete collapse of left lung. Large pleural effusion
surrounding the left lung. Mass effect with mediastinal shift
towards the right. Moderate-sized dependent effusion on the right
with dependent pulmonary atelectasis. Nondependent lung is largely
clear. Abnormal density in the right upper lobe suggests developing
bronchopneumonia. Focal areas of irregular nodular shadows within
the right lung are nonspecific and could represent foci of
atelectasis or pneumonia. Neoplastic mass lesions seem unlikely
given the generally clear previous chest radiography on the right.

Upper Abdomen: Negative

Musculoskeletal: Normal
IMPRESSION: Very large left effusion. Complete collapse of left lung. Mass
effect with left-to-right mediastinal shift.

Moderate size right effusion layering dependently. Dependent
pulmonary atelectasis. Possible developing pneumonia right upper
lobe. Areas of patchy pulmonary density in the right lung could
represent atelectasis or nodular infiltrates. Mass lesions seem less
likely, but follow-up should be performed.

## 2018-04-13 IMAGING — CR DG CHEST 1V PORT
1 series · 1 of 1 positions shown · non-contrast
Comparison: 11/22/2016

CLINICAL DATA: Status post chest tube placement

EXAM:
PORTABLE CHEST 1 VIEW

[AP]
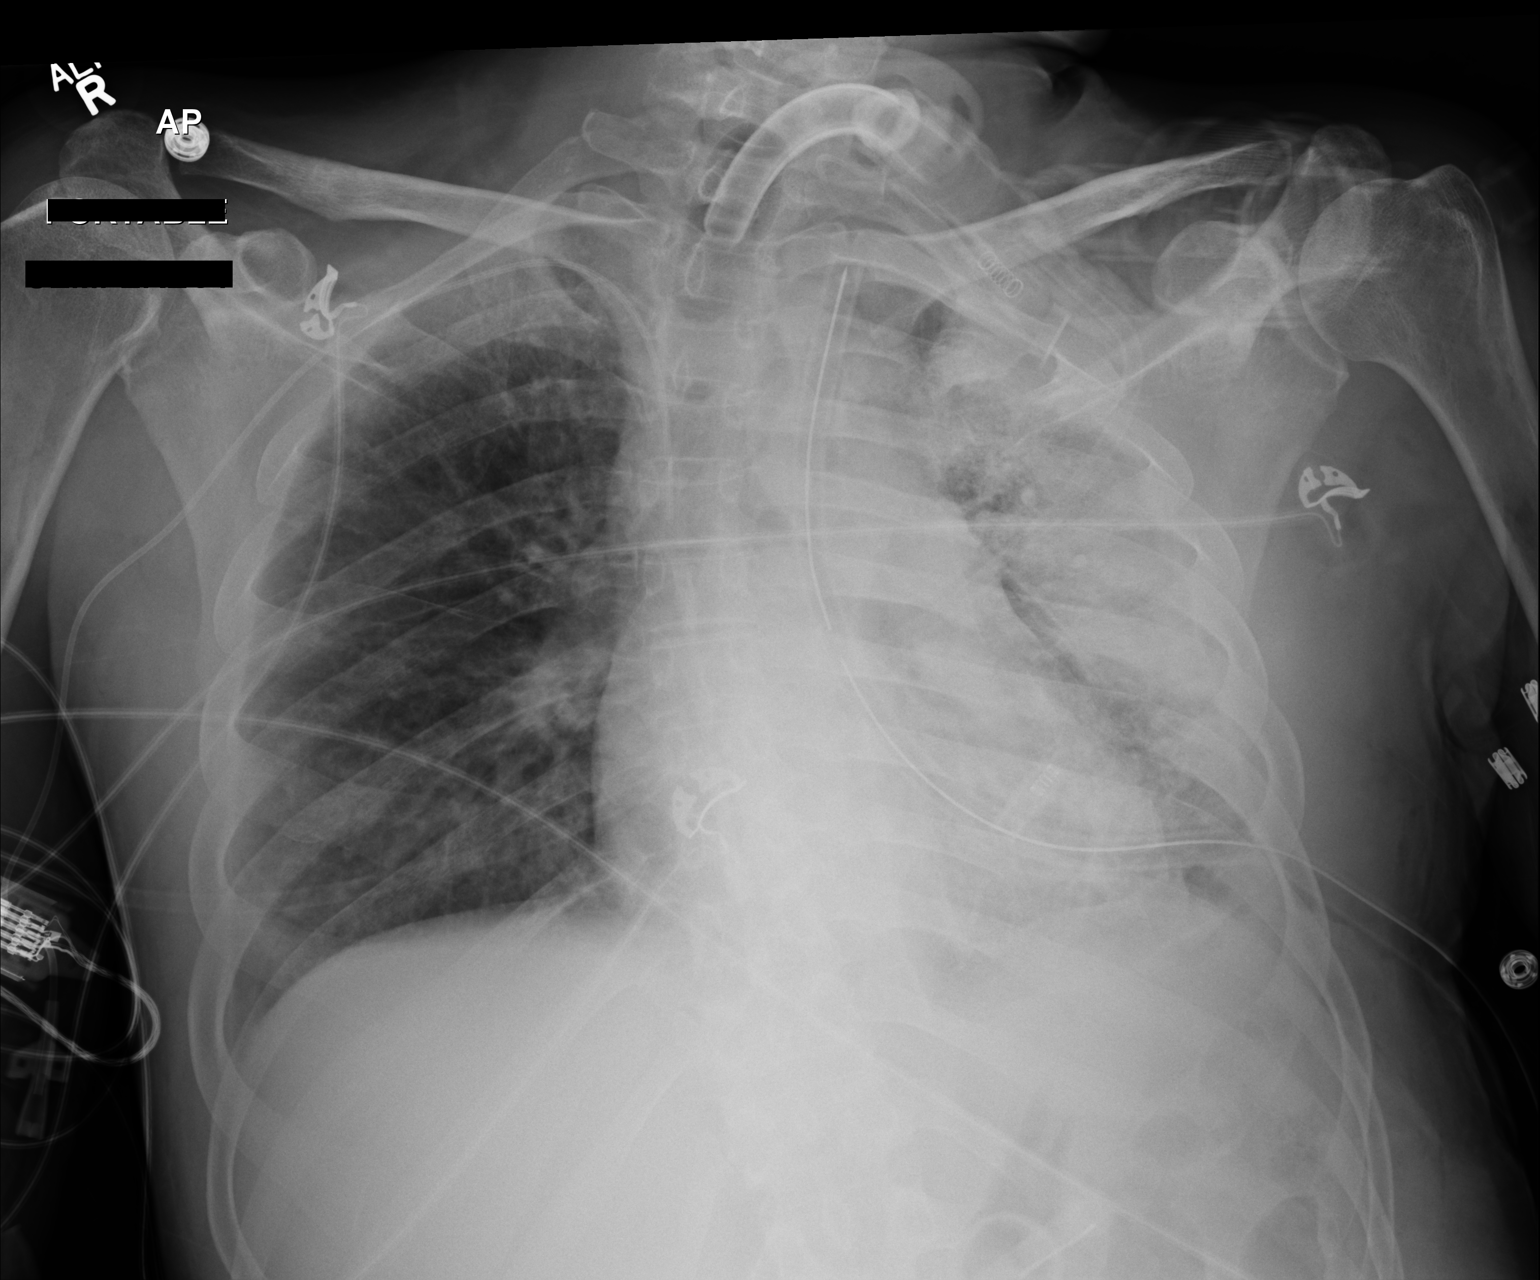

[1 of 1 positions shown; findings below may reference images not displayed]

FINDINGS: Recent chest tube placement has been performed. No pneumothorax is
noted. Significant reduction in pleural fluid is seen. There is also
significant improved aeration in the left lung. Persistent
infiltrate is noted. Right PICC line and tracheostomy tube are again
noted.
IMPRESSION: Status post chest tube placement with significant re-expansion of
the left lung and reduction in left pleural effusion.

## 2018-04-25 IMAGING — CR DG CHEST 1V PORT
1 series · 1 of 1 positions shown · non-contrast
Comparison: 11/23/2016

CLINICAL DATA: Followup exam for pleural effusion in possible
pneumothorax. Left chest tube.

EXAM:
PORTABLE CHEST 1 VIEW

[AP]
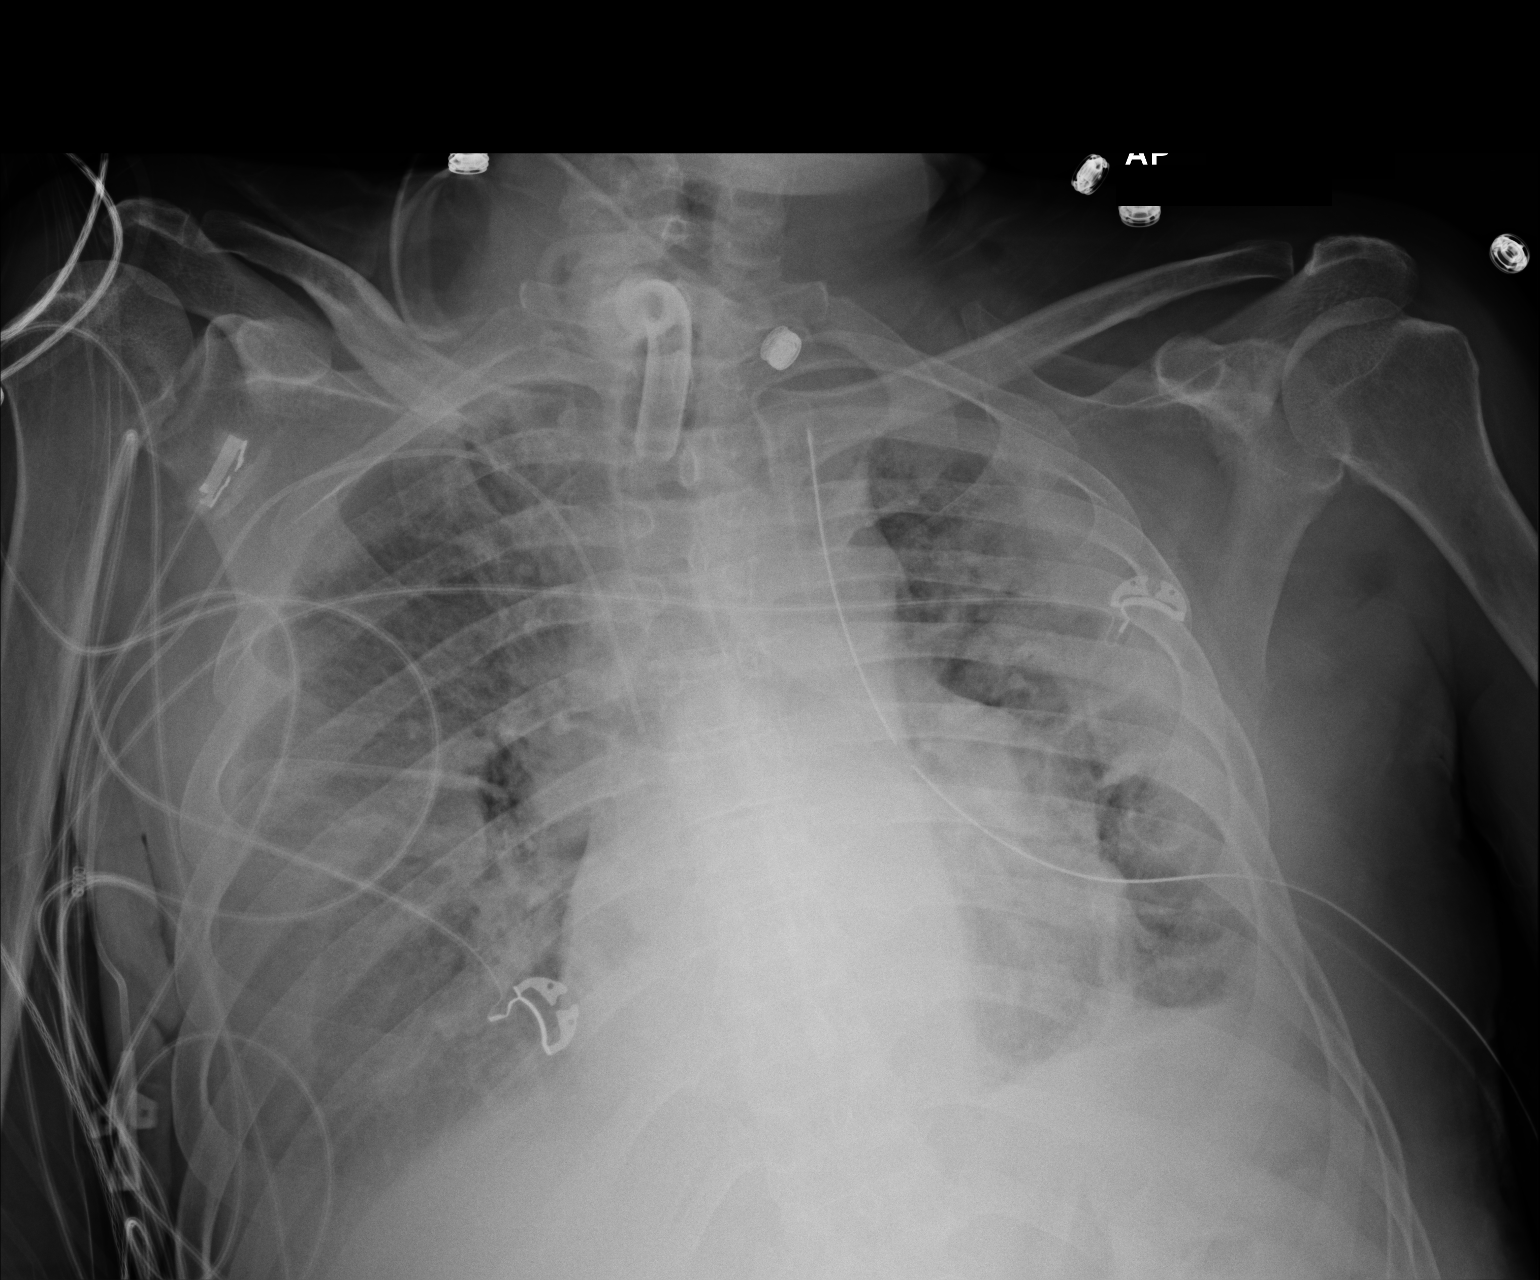

[1 of 1 positions shown; findings below may reference images not displayed]

FINDINGS: Left chest tube is stable with its tip at the left apex. No
pneumothorax.

Airspace consolidation is noted on the left most apparent
peripherally. There is a small residual left pleural effusion.

Hazy airspace opacity has developed throughout most of the right
lung. Probable small right pleural effusion.

Right PICC and tracheostomy tube are stable and well positioned.
IMPRESSION: 1. Worsened lung aeration on the right with new hazy airspace
opacity throughout most of the right lung. Probable small right
pleural effusion.
2. Persistent airspace opacities in the left lung with a small
residual pleural effusion.
3. Stable chest tube.  No pneumothorax.

## 2018-04-27 IMAGING — CR DG CHEST 1V PORT
1 series · 1 of 1 positions shown · non-contrast
Comparison: Radiograph May 05, 2017.

CLINICAL DATA: Pneumothorax.

EXAM:
PORTABLE CHEST 1 VIEW

[AP]
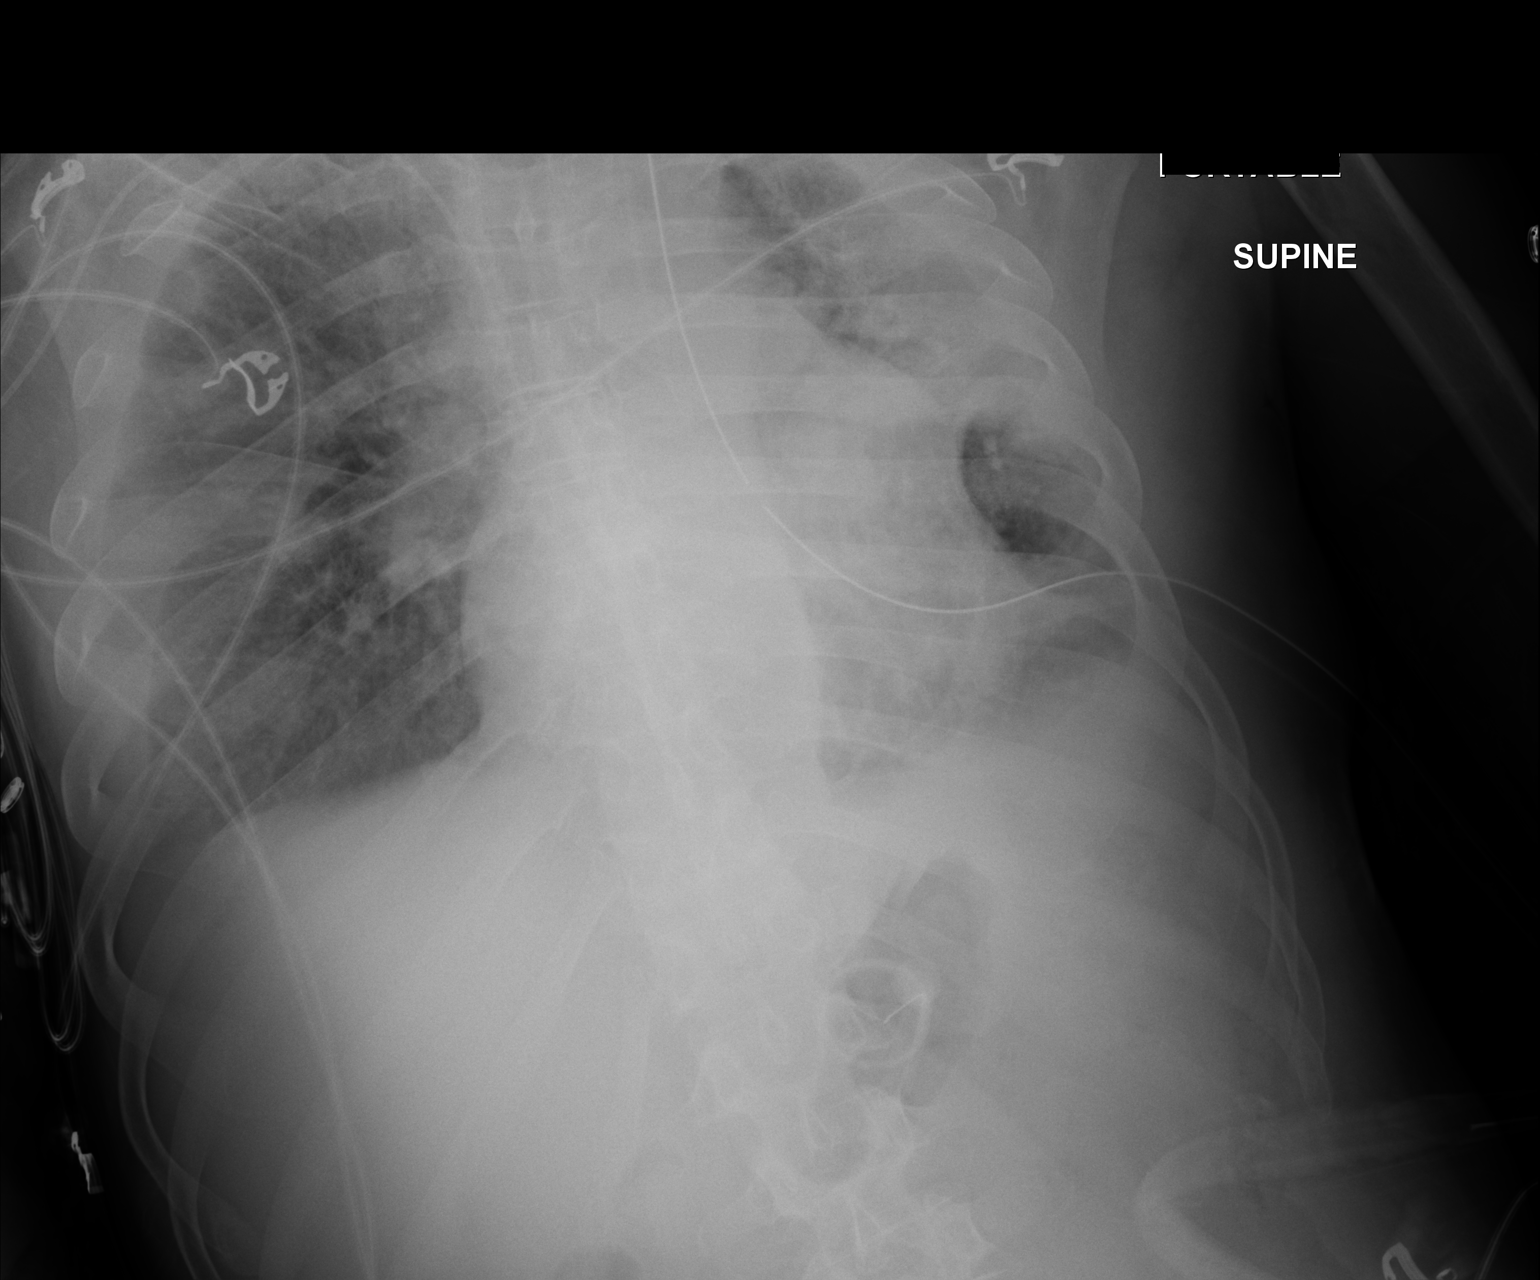

[1 of 1 positions shown; findings below may reference images not displayed]

FINDINGS: Stable cardiomediastinal silhouette. Endotracheal tube is unchanged
in position. Right-sided PICC line is again noted and unchanged.
Left-sided chest tube is unchanged. No pneumothorax is noted. Stable
probable small loculated pleural effusion along left lateral chest
wall. Probable layering effusion seen in right lung. Stable left
upper lobe opacity concerning for atelectasis or infiltrate. Bony
thorax is unremarkable.
IMPRESSION: Stable support apparatus. No pneumothorax is noted. Stable probable
loculated left pleural effusion along left chest wall. Stable
probable right pleural effusion. Stable left upper lobe opacity
concerning for atelectasis or infiltrate.

## 2018-05-05 IMAGING — CR DG CHEST 1V PORT
1 series · 1 of 1 positions shown · non-contrast
Comparison: 12/09/2016 .

CLINICAL DATA: Hypoxia.

EXAM:
PORTABLE CHEST 1 VIEW

[AP]
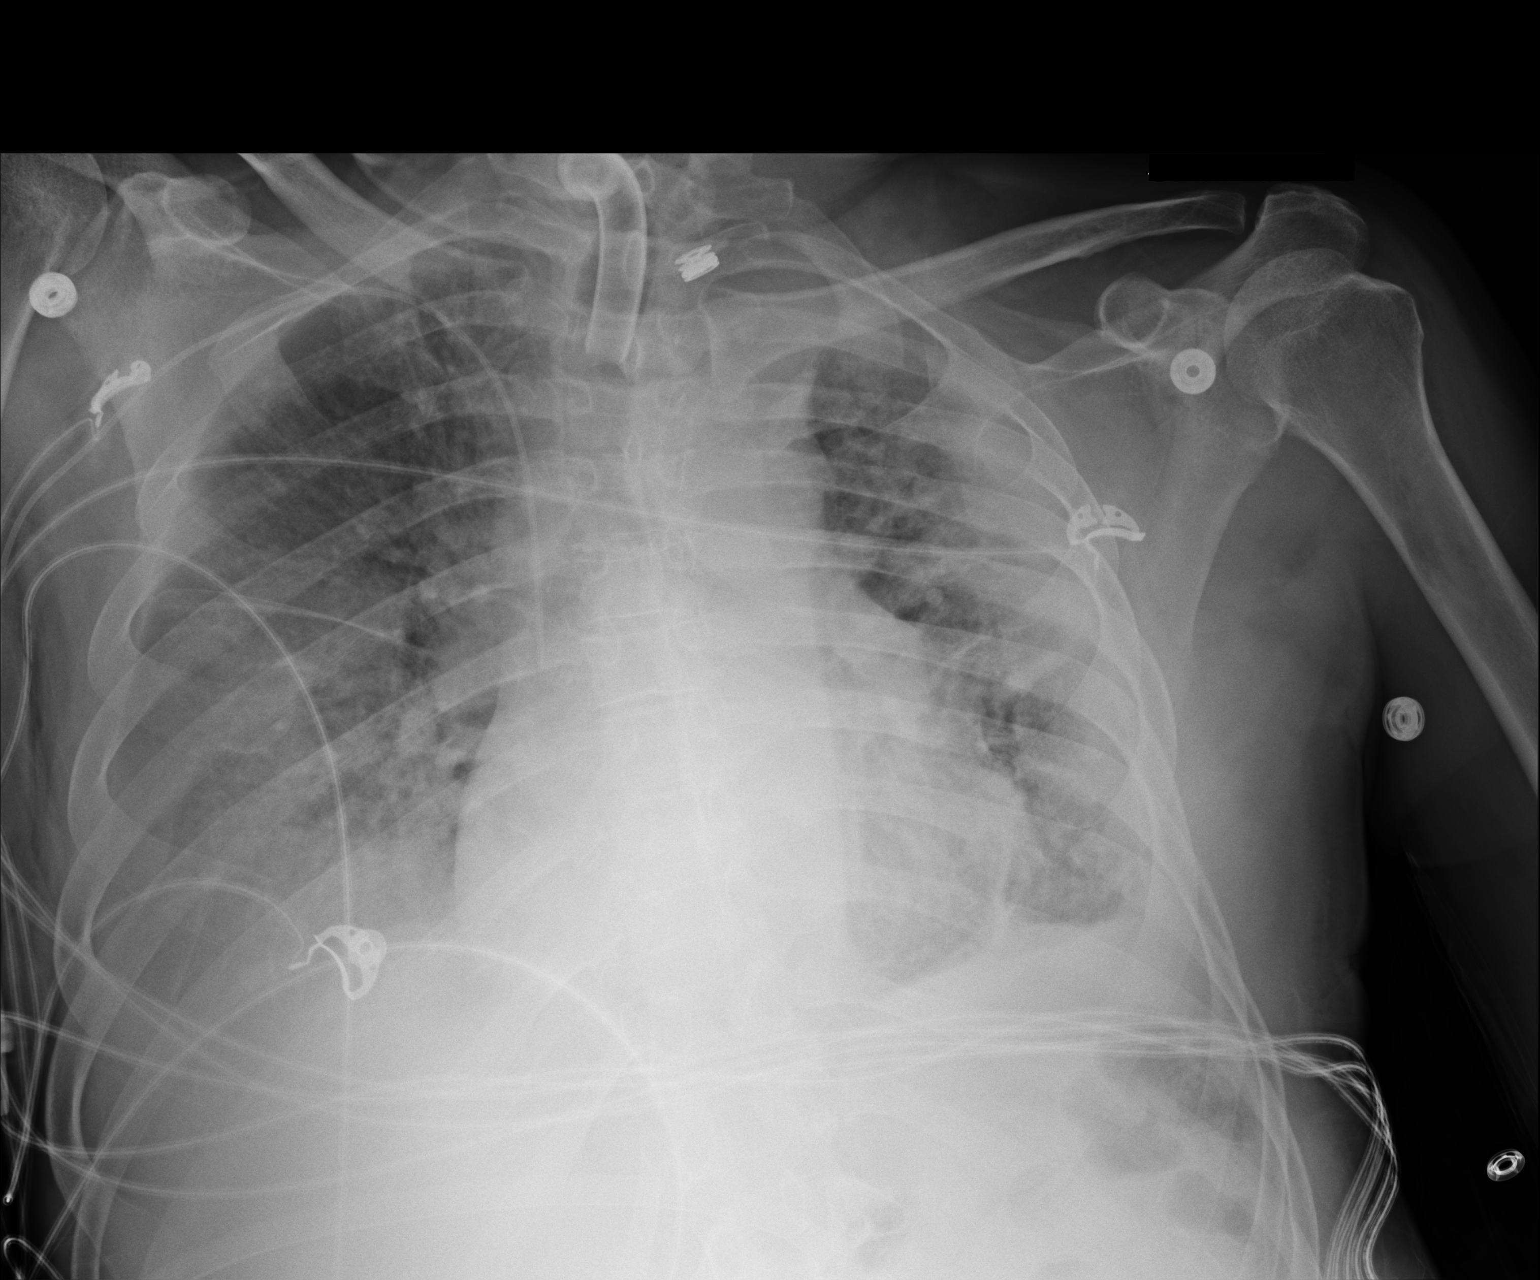

[1 of 1 positions shown; findings below may reference images not displayed]

FINDINGS: Tracheostomy tube and right PICC line stable position. Cardiomegaly
with normal pulmonary vascularity. Bilateral pulmonary infiltrates,
progressed from prior exam. Bilateral pleural effusions. Similar
findings noted on prior exam.
IMPRESSION: 1. Tracheostomy tube and right PICC line stable position.

2. Bilateral pulmonary infiltrates and bilateral pleural effusions
noted. Pulmonary infiltrates have progressed from prior exam.

## 2018-05-27 IMAGING — US US ABDOMEN LIMITED
1 series · 14 of 25 positions shown · non-contrast
Comparison: 11/13/2011 CT scan

CLINICAL DATA: Abnormal liver function tests.

EXAM:
US ABDOMEN LIMITED - RIGHT UPPER QUADRANT

[Series 1: us abdomen limited · 0.26mm/px · 14 of 51 slices shown]
[im 1/51]
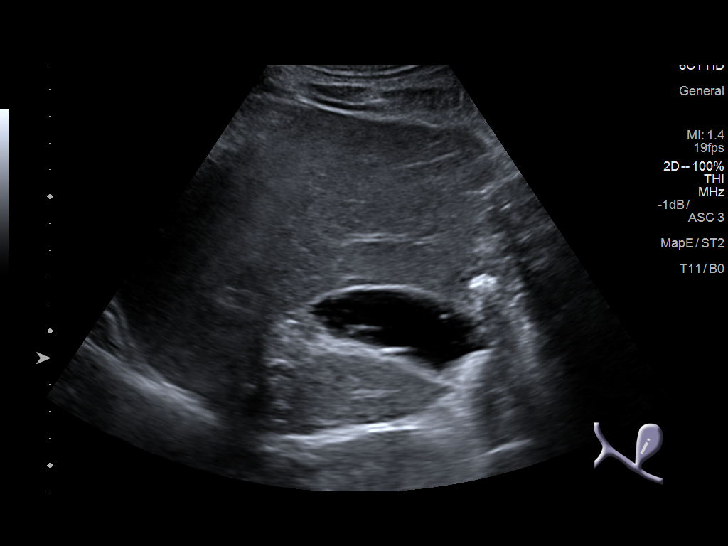
[im 5/51]
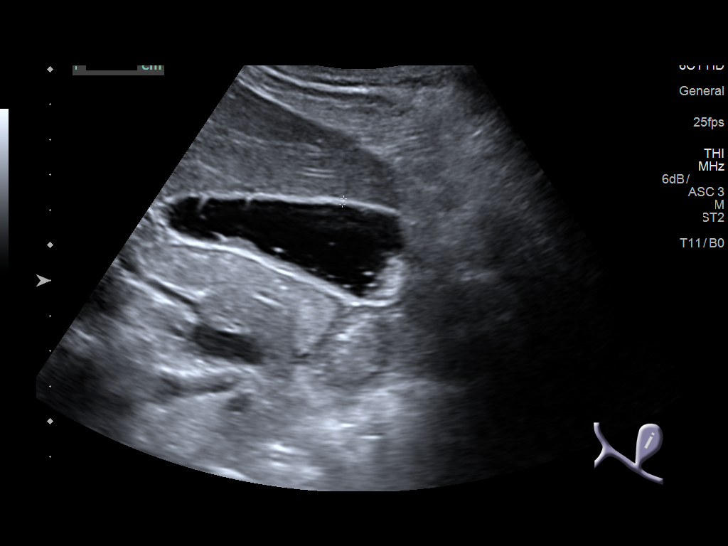
[im 9/51]
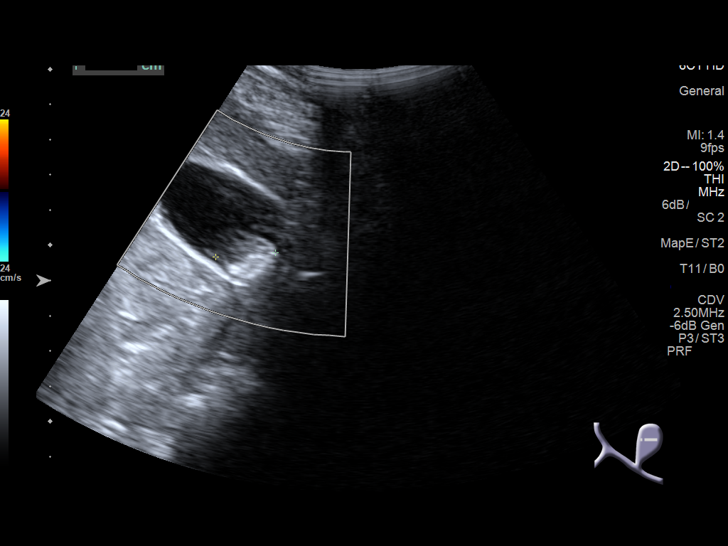
[im 13/51]
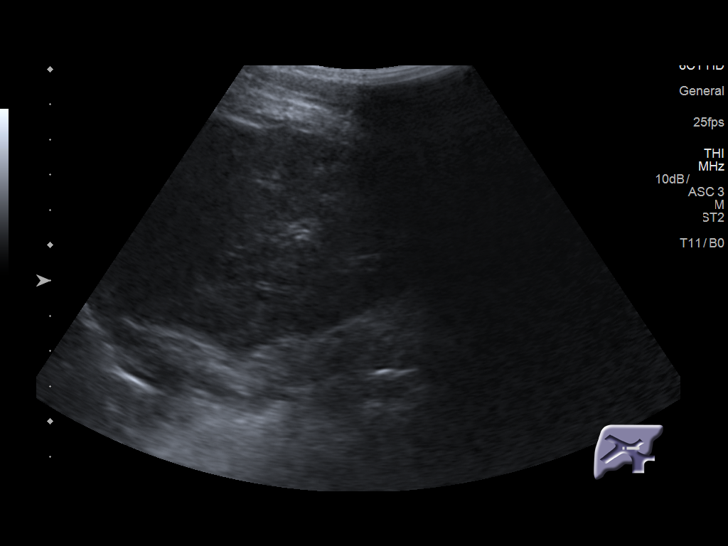
[im 17/51]
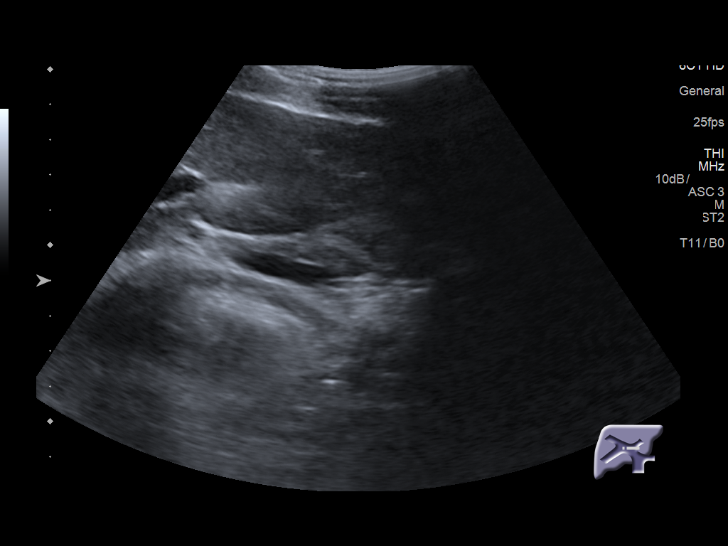
[im 19/51]
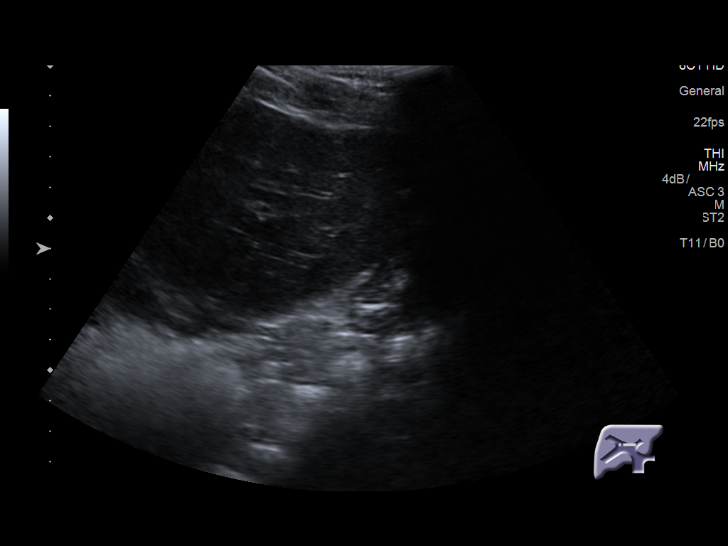
[im 23/51]
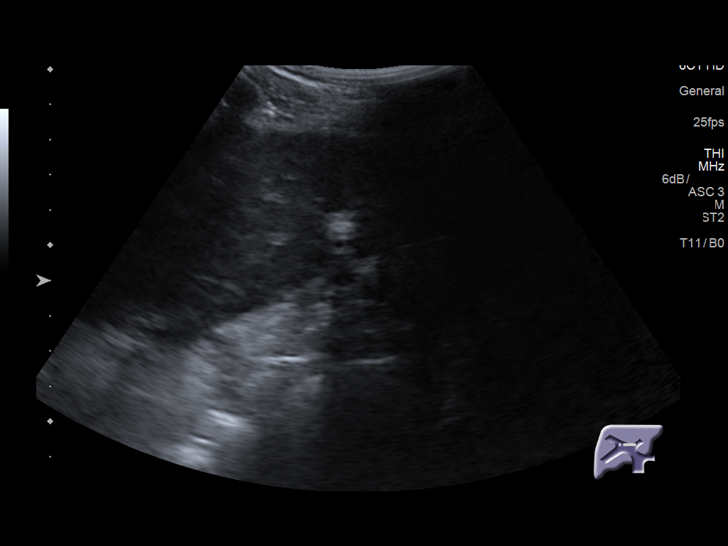
[im 28/51]
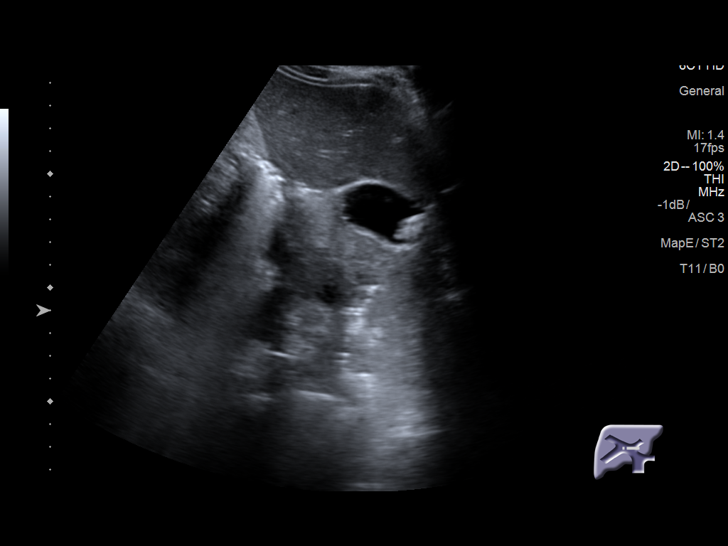
[im 32/51]
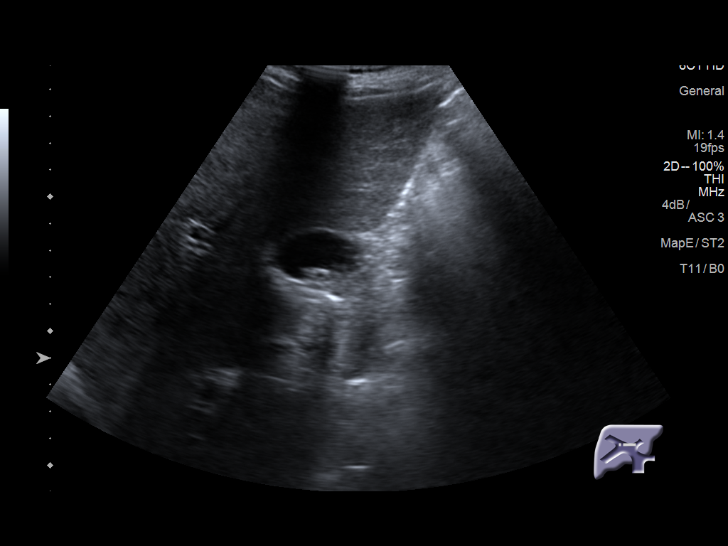
[im 34/51]
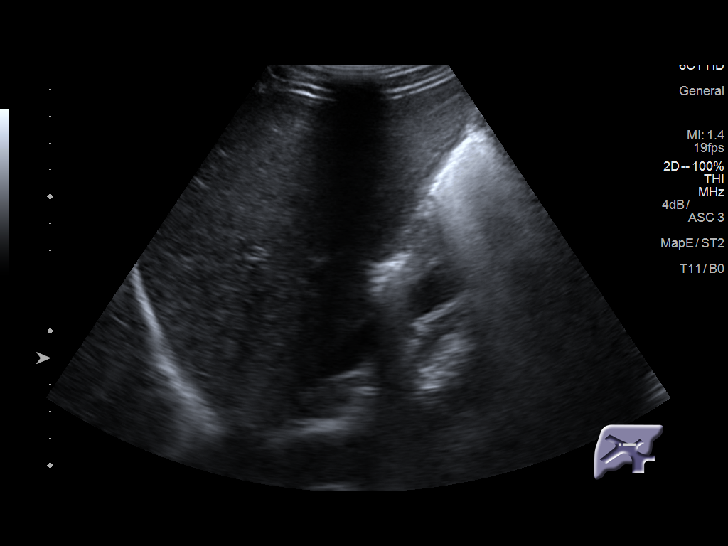
[im 38/51]
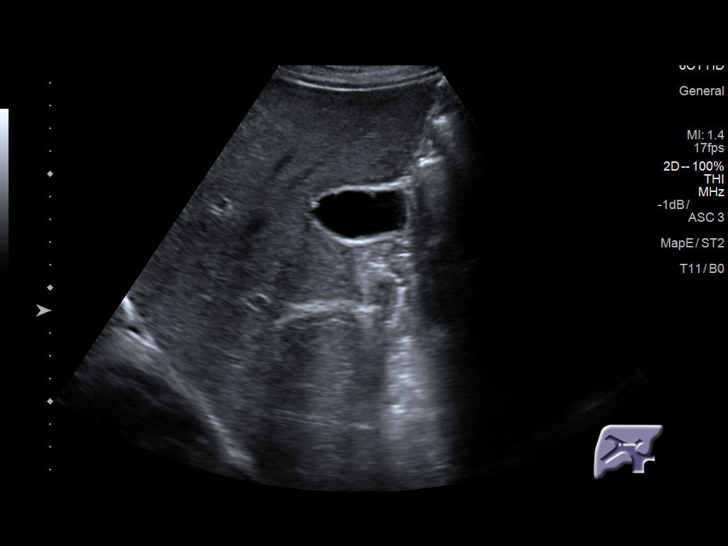
[im 42/51]
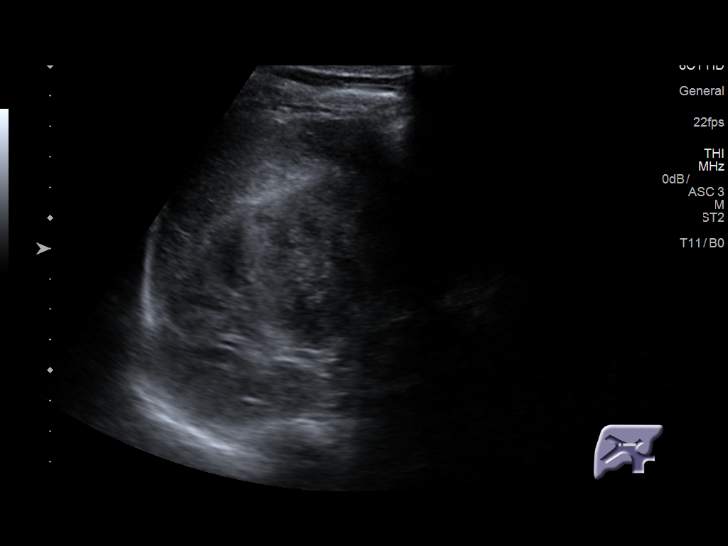
[im 46/51]
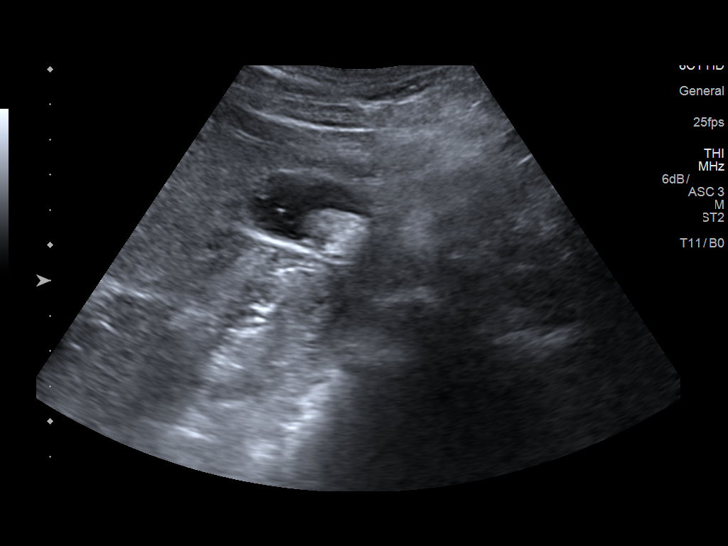
[im 51/51]
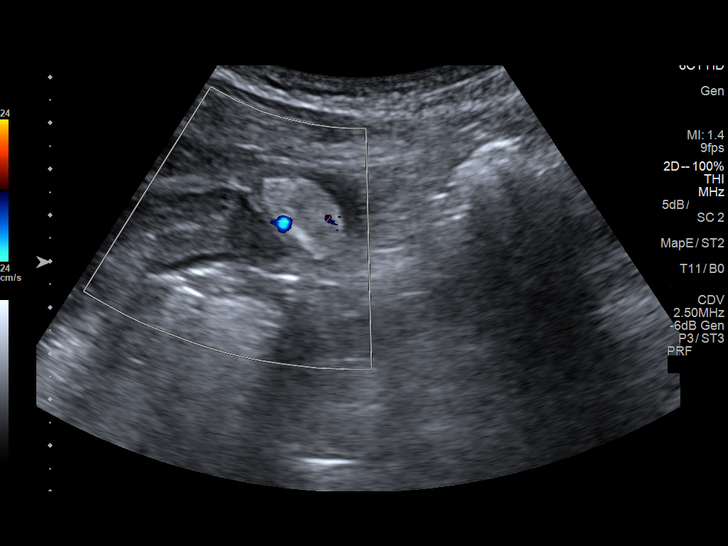

[14 of 25 positions shown; findings below may reference images not displayed]

FINDINGS: Gallbladder:

Multiple tiny gallstones are present measuring up to 3 mm diameter.
These cause mild shadowing. There also appears to be tumefactive
sludge in the gallbladder especially along the fundus, surrounding a
small gallstone. This is mobile and does not demonstrate internal
blood flow to suggest a polyp. There could be smaller polyps along
the gallbladder wall given the specular tiny echogenicities along
the gallbladder wall.

Common bile duct:

Diameter: 3 mm

Liver:

No focal lesion identified. Within normal limits in parenchymal
echogenicity.
IMPRESSION: 1. Multiple small gallstones in the gallbladder. There is some
tumefactive sludge surrounding one of the gallstones, mobile within
the gallbladder. The other gallstones are in the 3 mm size range.
There may be some tiny polyps along the gallbladder wall but none
measuring more than 3 mm in diameter. Hyperplastic cholecystosis
with superimposed sludge and tiny gallstones not excluded. No overt
gallbladder wall thickening. Sonographic Murphy's sign absent.
